# Patient Record
Sex: Male | Born: 1945
Health system: Southern US, Community
[De-identification: ages and names within clinical notes are randomized; demographics above are authoritative.]

## PROBLEM LIST (undated history)

## (undated) DIAGNOSIS — I10 Essential (primary) hypertension: Secondary | ICD-10-CM

## (undated) DIAGNOSIS — E785 Hyperlipidemia, unspecified: Secondary | ICD-10-CM

## (undated) DIAGNOSIS — M109 Gout, unspecified: Secondary | ICD-10-CM

## (undated) DIAGNOSIS — N186 End stage renal disease: Secondary | ICD-10-CM

## (undated) DIAGNOSIS — I059 Rheumatic mitral valve disease, unspecified: Secondary | ICD-10-CM

## (undated) DIAGNOSIS — I639 Cerebral infarction, unspecified: Secondary | ICD-10-CM

## (undated) DIAGNOSIS — B1920 Unspecified viral hepatitis C without hepatic coma: Secondary | ICD-10-CM

## (undated) DIAGNOSIS — F329 Major depressive disorder, single episode, unspecified: Secondary | ICD-10-CM

## (undated) DIAGNOSIS — IMO0002 Reserved for concepts with insufficient information to code with codable children: Secondary | ICD-10-CM

## (undated) DIAGNOSIS — F419 Anxiety disorder, unspecified: Secondary | ICD-10-CM

## (undated) DIAGNOSIS — E669 Obesity, unspecified: Secondary | ICD-10-CM

## (undated) DIAGNOSIS — R9430 Abnormal result of cardiovascular function study, unspecified: Secondary | ICD-10-CM

## (undated) DIAGNOSIS — J449 Chronic obstructive pulmonary disease, unspecified: Secondary | ICD-10-CM

## (undated) DIAGNOSIS — F32A Depression, unspecified: Secondary | ICD-10-CM

## (undated) DIAGNOSIS — R9439 Abnormal result of other cardiovascular function study: Secondary | ICD-10-CM

## (undated) DIAGNOSIS — H3500 Unspecified background retinopathy: Secondary | ICD-10-CM

## (undated) DIAGNOSIS — N529 Male erectile dysfunction, unspecified: Secondary | ICD-10-CM

## (undated) DIAGNOSIS — I82409 Acute embolism and thrombosis of unspecified deep veins of unspecified lower extremity: Secondary | ICD-10-CM

## (undated) DIAGNOSIS — R0602 Shortness of breath: Secondary | ICD-10-CM

## (undated) DIAGNOSIS — I5022 Chronic systolic (congestive) heart failure: Secondary | ICD-10-CM

## (undated) DIAGNOSIS — M1990 Unspecified osteoarthritis, unspecified site: Secondary | ICD-10-CM

## (undated) DIAGNOSIS — R7881 Bacteremia: Secondary | ICD-10-CM

## (undated) DIAGNOSIS — D649 Anemia, unspecified: Secondary | ICD-10-CM

## (undated) DIAGNOSIS — Z87898 Personal history of other specified conditions: Secondary | ICD-10-CM

## (undated) DIAGNOSIS — E119 Type 2 diabetes mellitus without complications: Secondary | ICD-10-CM

## (undated) DIAGNOSIS — I2510 Atherosclerotic heart disease of native coronary artery without angina pectoris: Secondary | ICD-10-CM

## (undated) HISTORY — DX: Obesity, unspecified: E66.9

## (undated) HISTORY — DX: Chronic systolic (congestive) heart failure: I50.22

## (undated) HISTORY — DX: Bacteremia: R78.81

## (undated) HISTORY — PX: OTHER SURGICAL HISTORY: SHX169

## (undated) HISTORY — DX: Rheumatic mitral valve disease, unspecified: I05.9

## (undated) HISTORY — DX: Unspecified background retinopathy: H35.00

## (undated) HISTORY — DX: Abnormal result of cardiovascular function study, unspecified: R94.30

## (undated) HISTORY — DX: Type 2 diabetes mellitus without complications: E11.9

## (undated) HISTORY — DX: Hyperlipidemia, unspecified: E78.5

## (undated) HISTORY — DX: End stage renal disease: N18.6

## (undated) HISTORY — DX: Unspecified viral hepatitis C without hepatic coma: B19.20

## (undated) HISTORY — PX: PENILE PROSTHESIS IMPLANT: SHX240

## (undated) HISTORY — PX: CATARACT EXTRACTION W/ INTRAOCULAR LENS  IMPLANT, BILATERAL: SHX1307

## (undated) HISTORY — DX: Male erectile dysfunction, unspecified: N52.9

## (undated) HISTORY — PX: COLONOSCOPY: SHX174

## (undated) HISTORY — PX: EYE SURGERY: SHX253

## (undated) HISTORY — DX: Reserved for concepts with insufficient information to code with codable children: IMO0002

## (undated) HISTORY — DX: Abnormal result of other cardiovascular function study: R94.39

---
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
## 1984-06-08 DIAGNOSIS — E1165 Type 2 diabetes mellitus with hyperglycemia: Secondary | ICD-10-CM | POA: Diagnosis present

## 2007-10-20 ENCOUNTER — Encounter: Payer: Self-pay | Admitting: Cardiovascular Disease

## 2008-02-23 ENCOUNTER — Encounter: Payer: Self-pay | Admitting: Cardiovascular Disease

## 2008-09-29 ENCOUNTER — Encounter: Payer: Self-pay | Admitting: Cardiovascular Disease

## 2008-09-30 ENCOUNTER — Encounter: Payer: Self-pay | Admitting: Cardiovascular Disease

## 2009-02-10 ENCOUNTER — Encounter: Payer: Self-pay | Admitting: Cardiovascular Disease

## 2009-02-15 ENCOUNTER — Encounter: Payer: Self-pay | Admitting: Cardiovascular Disease

## 2009-02-17 ENCOUNTER — Encounter: Payer: Self-pay | Admitting: Cardiovascular Disease

## 2009-02-28 ENCOUNTER — Ambulatory Visit: Payer: Self-pay | Admitting: Cardiovascular Disease

## 2009-02-28 DIAGNOSIS — E119 Type 2 diabetes mellitus without complications: Secondary | ICD-10-CM

## 2009-02-28 DIAGNOSIS — H35029 Exudative retinopathy, unspecified eye: Secondary | ICD-10-CM

## 2009-02-28 DIAGNOSIS — N186 End stage renal disease: Secondary | ICD-10-CM

## 2009-02-28 DIAGNOSIS — M109 Gout, unspecified: Secondary | ICD-10-CM

## 2009-02-28 DIAGNOSIS — Z992 Dependence on renal dialysis: Secondary | ICD-10-CM

## 2009-03-21 ENCOUNTER — Telehealth (INDEPENDENT_AMBULATORY_CARE_PROVIDER_SITE_OTHER): Payer: Self-pay | Admitting: Radiology

## 2009-03-28 ENCOUNTER — Ambulatory Visit (HOSPITAL_COMMUNITY): Admission: RE | Admit: 2009-03-28 | Discharge: 2009-03-28 | Payer: Self-pay | Admitting: Ophthalmology

## 2009-03-30 ENCOUNTER — Telehealth (INDEPENDENT_AMBULATORY_CARE_PROVIDER_SITE_OTHER): Payer: Self-pay | Admitting: *Deleted

## 2009-03-31 ENCOUNTER — Encounter: Payer: Self-pay | Admitting: Cardiovascular Disease

## 2009-03-31 ENCOUNTER — Ambulatory Visit: Payer: Self-pay

## 2009-04-12 ENCOUNTER — Ambulatory Visit (HOSPITAL_COMMUNITY): Admission: RE | Admit: 2009-04-12 | Discharge: 2009-04-12 | Payer: Self-pay | Admitting: Nephrology

## 2009-07-20 ENCOUNTER — Encounter (INDEPENDENT_AMBULATORY_CARE_PROVIDER_SITE_OTHER): Payer: Self-pay | Admitting: *Deleted

## 2009-09-10 ENCOUNTER — Inpatient Hospital Stay (HOSPITAL_COMMUNITY): Admission: AD | Admit: 2009-09-10 | Discharge: 2009-09-10 | Payer: Self-pay | Admitting: Orthopedic Surgery

## 2009-09-10 ENCOUNTER — Ambulatory Visit: Payer: Self-pay | Admitting: Surgery

## 2009-09-26 ENCOUNTER — Ambulatory Visit (HOSPITAL_COMMUNITY): Admission: RE | Admit: 2009-09-26 | Discharge: 2009-09-26 | Payer: Self-pay | Admitting: Ophthalmology

## 2009-09-30 ENCOUNTER — Ambulatory Visit: Payer: Self-pay | Admitting: Cardiovascular Disease

## 2009-10-14 ENCOUNTER — Ambulatory Visit (HOSPITAL_COMMUNITY): Admission: RE | Admit: 2009-10-14 | Discharge: 2009-10-14 | Payer: Self-pay | Admitting: Vascular Surgery

## 2009-10-14 ENCOUNTER — Ambulatory Visit: Payer: Self-pay | Admitting: Vascular Surgery

## 2009-11-03 ENCOUNTER — Ambulatory Visit: Payer: Self-pay | Admitting: Vascular Surgery

## 2009-11-03 DIAGNOSIS — Z794 Long term (current) use of insulin: Secondary | ICD-10-CM | POA: Insufficient documentation

## 2009-11-29 ENCOUNTER — Ambulatory Visit (HOSPITAL_COMMUNITY): Admission: RE | Admit: 2009-11-29 | Discharge: 2009-11-29 | Payer: Self-pay | Admitting: Vascular Surgery

## 2009-11-29 ENCOUNTER — Ambulatory Visit: Payer: Self-pay | Admitting: Vascular Surgery

## 2010-01-30 ENCOUNTER — Encounter (INDEPENDENT_AMBULATORY_CARE_PROVIDER_SITE_OTHER): Payer: Self-pay | Admitting: *Deleted

## 2010-03-16 ENCOUNTER — Ambulatory Visit (HOSPITAL_COMMUNITY): Admission: RE | Admit: 2010-03-16 | Discharge: 2010-03-16 | Payer: Self-pay | Admitting: Vascular Surgery

## 2010-03-16 ENCOUNTER — Ambulatory Visit: Payer: Self-pay | Admitting: Vascular Surgery

## 2010-03-28 ENCOUNTER — Ambulatory Visit: Payer: Self-pay | Admitting: Cardiovascular Disease

## 2010-04-24 ENCOUNTER — Ambulatory Visit (HOSPITAL_COMMUNITY): Admission: RE | Admit: 2010-04-24 | Discharge: 2010-04-24 | Payer: Self-pay | Admitting: Ophthalmology

## 2010-05-23 ENCOUNTER — Ambulatory Visit (HOSPITAL_COMMUNITY): Admission: RE | Admit: 2010-05-23 | Discharge: 2010-05-23 | Payer: Self-pay | Admitting: Nephrology

## 2010-06-10 ENCOUNTER — Ambulatory Visit (HOSPITAL_COMMUNITY): Admission: RE | Admit: 2010-06-10 | Discharge: 2010-06-10 | Payer: Self-pay | Admitting: *Deleted

## 2010-09-12 ENCOUNTER — Encounter (INDEPENDENT_AMBULATORY_CARE_PROVIDER_SITE_OTHER): Payer: Self-pay | Admitting: *Deleted

## 2010-09-12 ENCOUNTER — Ambulatory Visit (HOSPITAL_COMMUNITY): Admission: RE | Admit: 2010-09-12 | Discharge: 2010-09-12 | Payer: Self-pay | Admitting: Nephrology

## 2010-09-13 ENCOUNTER — Ambulatory Visit: Payer: Self-pay | Admitting: Vascular Surgery

## 2010-09-13 ENCOUNTER — Ambulatory Visit (HOSPITAL_COMMUNITY): Admission: RE | Admit: 2010-09-13 | Discharge: 2010-09-13 | Payer: Self-pay | Admitting: Vascular Surgery

## 2010-09-21 ENCOUNTER — Ambulatory Visit (HOSPITAL_COMMUNITY)
Admission: RE | Admit: 2010-09-21 | Discharge: 2010-09-21 | Payer: Self-pay | Source: Home / Self Care | Admitting: Vascular Surgery

## 2010-10-04 ENCOUNTER — Ambulatory Visit: Payer: Self-pay | Admitting: Vascular Surgery

## 2010-10-12 ENCOUNTER — Encounter
Admission: RE | Admit: 2010-10-12 | Discharge: 2010-10-12 | Payer: Self-pay | Source: Home / Self Care | Attending: Nephrology | Admitting: Nephrology

## 2010-10-24 ENCOUNTER — Ambulatory Visit
Admission: RE | Admit: 2010-10-24 | Discharge: 2010-10-24 | Payer: Self-pay | Source: Home / Self Care | Attending: Gastroenterology | Admitting: Gastroenterology

## 2010-11-14 NOTE — Letter (Signed)
Summary: Appointment - [facility]inder [place], [place]  [address] [place]   [place], [place] [address]   Phone: [phone]  Fax: [phone]     January 30, 2010 MRN: [id number]   JECORY VOCE [address] [place], [place]  [address]   Dear Mr. PASIERB,  [facility] records indicate that it is time to schedule a follow-up appointment with [name]. It is very important that we reach you to schedule this appointment. We look forward to participating in your health care needs. Please contact us at the number listed above at your earliest convenience to schedule your appointment.  If you are unable to make an appointment at this time, give [place] a call so we can update our records.     Sincerely,   Darnell Level [facility] Scheduling Team

## 2010-11-14 NOTE — Letter (Signed)
Summary: New Patient letter  [facility]  [address] [place], [place] [address]   Phone: [phone]  Fax: [phone]       09/12/2010 MRN: [id number]    Ernest [name] [address] [place], [place]  [address]  Dear Mr. PILE,  Welcome to the Gastroenterology Division at [facility].    You are scheduled to see Dr. Owens Loffler on Tuesday, October 24, 2010 at 9:15 a.m. on the 3rd floor at [facility].  We ask that you try to arrive at our office 15 minutes prior to your appointment time to allow for check-in.  We would like you to complete the enclosed self-administered evaluation form prior to your visit and bring it with you on the day of your appointment.  We will review it with you.  Also, please bring a complete list of all your medications or, if you prefer, bring the medication bottles and we will list them.  Please bring your insurance card so that we may make a copy of it.  If your insurance requires a referral to see a specialist, please bring your referral form from your primary care physician.  Co-payments are due at the time of your visit and may be paid by cash, check or credit card.     Your office visit will consist of a consult with your physician (includes a physical exam), any laboratory testing he/she may order, scheduling of any necessary diagnostic testing (e.g. x-ray, ultrasound, CT-scan), and scheduling of a procedure (e.g. Endoscopy, Colonoscopy) if required.  Please allow enough time on your schedule to allow for any/all of these possibilities.    If you cannot keep your appointment, please call [phone] to cancel or reschedule prior to your appointment date.  This allows [place] the opportunity to schedule an appointment for another patient in need of care.  If you do not cancel or reschedule by 5 p.m. the business day prior to your appointment date, you will be charged a $50.00 late cancellation/no-show fee.     Thank you for choosing [facility] for your medical needs.  We appreciate the opportunity to care for you.  Please visit [place] at our website  to learn more about our practice.                     Sincerely,                                                             The Gastroenterology Division

## 2010-11-14 NOTE — Miscellaneous (Signed)
  Clinical Lists Changes  Observations: Added new observation of EKG INTERP: NSR 85 LAD LVH Nonspecific ST/T wave changes (03/28/2010 15:27)      EKG Report  Procedure date:  03/28/2010  Findings:      NSR 85 LAD LVH Nonspecific ST/T wave changes

## 2010-11-14 NOTE — Assessment & Plan Note (Signed)
Summary: Ernest [name]   Visit Type:  Follow-up   History of Present Illness: Diabetic with multiple CRF's who moved from [place] with presumed CAD but never been cathed.  On dialysis for the past year.  Myovue 6/10 in our office normal and non-ischemic.  Been maintained on ASA and Plavix.  Sometimes takes Plavix every other day to save money. He gets dialysis in [facility] and Dr Clover Mealy is his primary nephrologist.  There have been no hemodynamic problems on dialysis or chest pain.  His BS has been suboptimally controlled due to dietary indiscretion and his insuling was just increased.    Current Problems (verified): 1)  Abnormal Electrocardiogram  (ICD-794.31) 2)  Mixed Hyperlipidemia  (ICD-272.2) 3)  Exudative Retinopathy  (ICD-362.12) 4)  Gout, Unspecified  (ICD-274.9) 5)  Esrd  (ICD-585.6) 6)  Aodm  (ICD-250.00)  Current Medications (verified): 1)  Aspirin 325 Mg  Tabs (Aspirin) .[name].. 1 Tab By Mouth Once Daily 2)  Plavix 75 Mg Tabs (Clopidogrel Bisulfate) .... Take One Tablet By Mouth Daily 3)  Calcium Acetate 667 Mg Caps (Calcium Acetate (Phos Binder)) .[name].. 1 Tab By Mouth Three Times A Day 4)  Colchicine 0.6 Mg Tabs (Colchicine) .[name].. 1 Tab By Mouth Once Daily 5)  Simvastatin 20 Mg Tabs (Simvastatin) .... Take One Tablet By Mouth Daily At Bedtime 6)  Allopurinol 100 Mg Tabs (Allopurinol) .[name].. 1 Tab Mon, Wen, Fri 7)  Rena-Vite  Tabs (B Complex-C-Folic Acid) .[name].. 1 Tab By Mouth Once Daily 8)  Metoprolol Succinate 50 Mg Xr24h-Tab (Metoprolol Succinate) .... Take 1/2 Tablet By Mouth Daily 9)  Humulin R 100 Unit/ml Soln (Insulin Regular Human) .... Uad 10)  Humulin N 100 Unit/ml Susp (Insulin Isophane Human) .... Uad 11)  Neurontin 300 Mg Caps (Gabapentin) .... Two Times A Day  Allergies (verified): No Known Drug Allergies  Past History:  Past Medical History: Last updated: 09/28/2009 Current Problems:  ABNORMAL ELECTROCARDIOGRAM (ICD-794.31) MIXED HYPERLIPIDEMIA  (ICD-272.2) EXUDATIVE RETINOPATHY (ICD-362.12) GOUT, UNSPECIFIED (ICD-274.9) ESRD (ICD-585.6) AODM (ICD-250.00) DM: since mid 9's with CRF and retinopathy CRF:  Dialysis since 2010 [name] Erectile dysfunction with penile implant Hepatitis C  Past Surgical History: Last updated: 03-15-09 Fistula RUE and wrist Penile implant  Family History: Last updated: 2009/03/15 Mother died 30 unknown cause Father with MI at age 71  Social History: Last updated: 03/15/2009 Retired Health visitor Disability Married Adopted mentally retarded child at home, 2 adopted children 3 living and 2 deceased Quti smoking 35 years ago Non-drinker  Review of Systems       Denies fever, malais, weight loss, blurry vision, decreased visual acuity, cough, sputum, SOB, hemoptysis, pleuritic pain, palpitaitons, heartburn, abdominal pain, melena, lower extremity edema, claudication, or rash.   Vital Signs:  Patient profile:   65 year old male Height:      69 inches Weight:      248 pounds BMI:     36.76 BP sitting:   126 / 50  (left arm)  Vitals Entered By: Margaretmary Bayley CMA (March 28, 2010 3:11 PM)  Physical Exam  General:  Affect appropriate Healthy:  appears stated age 8: normal Neck supple with no adenopathy JVP normal no bruits no thyromegaly Lungs clear with no wheezing and good diaphragmatic motion Heart:  S1/S2 no murmur,rub, gallop or click PMI normal Abdomen: benighn, BS positve, no tenderness, no AAA no bruit.  No HSM or HJR Distal pulses intact with no bruits No edema Neuro non-focal Skin warm and dry Fistula in RUE that  has good thrill Bilateral wrist fistula not active   Impression & Recommendations:  Problem # 1:  MIXED HYPERLIPIDEMIA (ICD-272.2) Continue statin given risk factors His updated medication list for this problem includes:    Simvastatin 20 Mg Tabs (Simvastatin) .[name]... Take one tablet by mouth daily at bedtime  Problem # 2:  ABNORMAL  ELECTROCARDIOGRAM (ICD-794.31) Likely related to body habitus and LVH.   His updated medication list for this problem includes:    Aspirin 325 Mg Tabs (Aspirin) .[name]... 1 tab by mouth once daily    Plavix 75 Mg Tabs (Clopidogrel bisulfate) .[name]... Take one tablet by mouth daily    Metoprolol Succinate 50 Mg Xr24h-tab (Metoprolol succinate) .[name]... Take 1/2 tablet by mouth daily  Orders: EKG w/ Interpretation (93000)  Problem # 3:  CAD (ICD-414.00) Presumed diagnosis from [place].  Nonischemic myovue here 6/10.  Normal LV function.  Continue ASA and Plavix His updated medication list for this problem includes:    Aspirin 325 Mg Tabs (Aspirin) .[name]... 1 tab by mouth once daily    Plavix 75 Mg Tabs (Clopidogrel bisulfate) .[name]... Take one tablet by mouth daily    Metoprolol Succinate 50 Mg Xr24h-tab (Metoprolol succinate) .[name]... Take 1/2 tablet by mouth daily  Patient Instructions: 1)  Your physician recommends that you schedule a follow-up appointment in: [place] 2)  Your physician recommends that you continue on your current medications as directed. Please refer to the Current Medication list given to you today.

## 2010-11-16 NOTE — Assessment & Plan Note (Signed)
History of Present Illness Visit Type: Initial Visit Primary GI MD: Owens Loffler MD Primary Provider: Marshall Cork, MD Chief Complaint: screening for transplant History of Present Illness:     very pleasant 65 year old man who is here with his family member today.  he had a colonoscopy in [place] about 2-1/2 at [facility] in [place] was told he needed an "up to date" colonoscopy for renal transplant workup.  He has been to [facility] kidney transplant.  he is on plavix, has been on it since 2006; was dizzy.  No GI symptoms.             Current Medications (verified): 1)  Aspirin 325 Mg  Tabs (Aspirin) .[name].. 1 Tab By Mouth Once Daily 2)  Plavix 75 Mg Tabs (Clopidogrel Bisulfate) .... Take One Tablet By Mouth Daily 3)  Calcium Acetate 667 Mg Caps (Calcium Acetate (Phos Binder)) .[name].. 1 Tab By Mouth Three Times A Day 4)  Colchicine 0.6 Mg Tabs (Colchicine) .[name].. 1 Tab By Mouth Once Daily 5)  Simvastatin 20 Mg Tabs (Simvastatin) .... Take One Tablet By Mouth Daily At Bedtime 6)  Allopurinol 100 Mg Tabs (Allopurinol) .[name].. 1 Tab Mon, Wen, Fri 7)  Rena-Vite  Tabs (B Complex-C-Folic Acid) .[name].. 1 Tab By Mouth Once Daily 8)  Metoprolol Succinate 50 Mg Xr24h-Tab (Metoprolol Succinate) .... Take 1/2 Tablet By Mouth Daily 9)  Humulin R 100 Unit/ml Soln (Insulin Regular Human) .... Uad 10)  Humulin N 100 Unit/ml Susp (Insulin Isophane Human) .... Uad 11)  Neurontin 300 Mg Caps (Gabapentin) .... Two Times A Day  Allergies (verified): No Known Drug Allergies  Past History:  Past Medical History: Current Problems:  ABNORMAL ELECTROCARDIOGRAM (ICD-794.31) MIXED HYPERLIPIDEMIA (ICD-272.2) EXUDATIVE RETINOPATHY (ICD-362.12) GOUT, UNSPECIFIED (ICD-274.9) ESRD (ICD-585.6), on hemodialysis Monday, Wednesday, Friday AODM (ICD-250.00) DM: since mid 80's with CRF and retinopathy CRF:  Dialysis since 2010 [name] Erectile dysfunction with penile implant Hepatitis C  Past  Surgical History: Fistula RUE and wrist Penile implant    Family History: Mother died 23 unknown cause Father with MI at age 68   No colon cancer  Social History: Retired Health visitor Disability Married Adopted mentally retarded child at home, 2 adopted children 3 living and 2 deceased Quti smoking 35 years ago Non-drinker    Review of Systems       Pertinent positive and negative review of systems were noted in the above HPI and GI specific review of systems.  All other review of systems was otherwise negative.   Vital Signs:  Patient profile:   65 year old male Height:      69 inches Weight:      250.50 pounds BMI:     37.13 Pulse rate:   72 / minute Pulse rhythm:   regular BP sitting:   158 / 70  (left arm) Cuff size:   large  Vitals Entered By: June McMurray CMA Deborra Medina) (October 24, 2010 8:55 AM)  Physical Exam  Additional Exam:  Constitutional: generally well appearing Psychiatric: alert and oriented times 3 Eyes: extraocular movements intact Mouth: oropharynx moist, no lesions Neck: supple, no lymphadenopathy Cardiovascular: heart regular rate and rythm Lungs: CTA bilaterally Abdomen: soft, non-tender, non-distended, no obvious ascites, no peritoneal signs, normal bowel sounds Extremities: no lower extremity edema bilaterally Skin: no lesions on visible extremities    Impression & Recommendations:  Problem # 1:  Routine risk for colon cancer he is pretty clear that he had a colonoscopy  within the last 2-3 years. We will get those records from [place] sent down for review including any pathology. Depending on those results, he may or may not need another colonoscopy around now. I will make the decision after reviewing his records.  Patient Instructions: 1)  We will get records from colonoscopy 2-3 years ago that was done in [place], also any pathology reports. 2)  A copy of this information will be sent to  Dr. Jonnie Finner, also to Mathis Dad, RN (phone number [phone]). 3)  The medication list was reviewed and reconciled.  All changed / newly prescribed medications were explained.  A complete medication list was provided to the patient / caregiver.

## 2010-12-20 ENCOUNTER — Other Ambulatory Visit (HOSPITAL_COMMUNITY): Payer: Medicare Other

## 2010-12-21 ENCOUNTER — Ambulatory Visit (HOSPITAL_COMMUNITY)
Admission: RE | Admit: 2010-12-21 | Discharge: 2010-12-21 | Disposition: A | Discharge status: Home / Self Care | Payer: Medicare Other | Source: Ambulatory Visit | Attending: Vascular Surgery | Admitting: Vascular Surgery

## 2010-12-21 ENCOUNTER — Ambulatory Visit (HOSPITAL_COMMUNITY): Payer: Medicare Other

## 2010-12-21 DIAGNOSIS — M109 Gout, unspecified: Secondary | ICD-10-CM | POA: Insufficient documentation

## 2010-12-21 DIAGNOSIS — E119 Type 2 diabetes mellitus without complications: Secondary | ICD-10-CM | POA: Insufficient documentation

## 2010-12-21 DIAGNOSIS — N186 End stage renal disease: Secondary | ICD-10-CM | POA: Insufficient documentation

## 2010-12-21 DIAGNOSIS — I120 Hypertensive chronic kidney disease with stage 5 chronic kidney disease or end stage renal disease: Secondary | ICD-10-CM | POA: Insufficient documentation

## 2010-12-21 DIAGNOSIS — T82898A Other specified complication of vascular prosthetic devices, implants and grafts, initial encounter: Secondary | ICD-10-CM | POA: Insufficient documentation

## 2010-12-21 DIAGNOSIS — Z951 Presence of aortocoronary bypass graft: Secondary | ICD-10-CM | POA: Insufficient documentation

## 2010-12-21 DIAGNOSIS — Y832 Surgical operation with anastomosis, bypass or graft as the cause of abnormal reaction of the patient, or of later complication, without mention of misadventure at the time of the procedure: Secondary | ICD-10-CM | POA: Insufficient documentation

## 2010-12-21 LAB — POCT I-STAT 4, (NA,K, GLUC, HGB,HCT)
Glucose, Bld: 92 mg/dL (ref 70–99)
HCT: 34 % — ABNORMAL LOW (ref 39.0–52.0)
Hemoglobin: 11.6 g/dL — ABNORMAL LOW (ref 13.0–17.0)
Potassium: 4.5 mEq/L (ref 3.5–5.1)
Sodium: 141 mEq/L (ref 135–145)

## 2010-12-22 NOTE — Op Note (Signed)
NAME:  Ernest [name], Ernest [name] NO.:  [id number]  MEDICAL RECORD NO.:  [id number]           PATIENT TYPE:  O  LOCATION:  [facility]SC                         FACILITY:  [facility]  PHYSICIAN:  [name]. Scot Dock, M.D.DATE OF BIRTH:  1946/07/13  DATE OF PROCEDURE:  12/21/2010 DATE OF DISCHARGE:  12/21/2010                              OPERATIVE REPORT   PREOPERATIVE DIAGNOSIS:  Clotted right upper arm atrioventricular fistula.  POSTOPERATIVE DIAGNOSIS:  Clotted right upper arm atrioventricular fistula.  PROCEDURE: 1. Thrombectomy of right upper arm AV fistula. 2. Revision of the fistula with interposition 6-mm PTFE at the     arterial end of the graft. 3. Intraoperative fistulogram.  SURGEON:  [name]. Scot Dock, MD  ASSISTANT:  Wray Kearns, [name]  ANESTHESIA:  Local with sedation.  INDICATIONS:  This is a 65 year old gentleman who had an upper arm fistula for some time.  He presented with evidence of an outflow stenosis and underwent fistulogram by Dr. Bridgett Larsson.  He ballooned to a cephalic vein stenosis with some disruption of the vein; however, he was able to salvage this and the patient has been using this fistula for dialysis.  He apparently went to the dialysis unit yesterday and got through most of the dialysis and then graft clotted.  The PA from the dialysis center reportedly asked that we perform a surgical thrombectomy.  TECHNIQUE:  The patient was taken to the operating room and sedated by Anesthesia.  The right upper extremity was prepped and draped in usual sterile fashion.  After the skin was anesthetized with 1% lidocaine, the incision over the proximal aspect of the fistula was dissected free and the vein was dissected free down it where it was anastomosed to the brachial artery.  Next, the patient was heparinized.  A transverse venotomy was made at the arterial end after the proximal vein was clamped.  There was thick diffuse intimal hyperplasia  present here.  The fistula further distally was clotted.  Using a #5 Fogarty catheter, I performed thrombectomy of the fistula with good backbleeding.  In pulling the catheter through the fistula, there were obviously multiple areas of intimal hyperplasia.  The tightest area was probably at the distal cephalic vein based on the length where it was most narrowed. Given the degree of intimal hyperplasia here, I felt the only option at the arterial end was to revise this with an interposition segment of 6- mm PTFE.  Tourniquet was placed on the upper arm.  The arm exsanguinated with Esmarch bandage, the tourniquet inflated to 270 mmHg.  Under tourniquet control, the proximal aspect of the fistula was spatulated down to below the level of intimal hyperplasia.  The 6-mm graft was slightly spatulated and sewn end-to-end to the vein using continuous 6-0 Prolene suture.  The more proximal vein was spatulated and the graft cut to the appropriate length, spatulated, and sewn end-to-end to the vein using continuous 6-0 Prolene suture.  At completion, the fistula was pulsatile.  I did shoot an intraoperative fistulogram which showed some diffuse intimal hyperplasia throughout the fistula but no focal stenosis.  I was unable to visualize distally  where I suspected to have a distal cephalic vein stenosis.  The heparin was partially reversed with protamine.  The wound was then closed with a deep layer of 3-0 Vicryl and the skin closed with 4-0 Vicryl.  Sterile dressing was applied.  The patient tolerated the procedure well and was transferred to the recovery room in stable condition.  All needle and sponge counts were correct.     [name]. Scot Dock, M.D.     CSD/MEDQ  D:  12/21/2010  T:  12/22/2010  Job:  [id number]  Electronically Signed by Deitra Mayo M.D. on 12/22/2010 06:54:06 AM

## 2010-12-26 LAB — GLUCOSE, CAPILLARY: Glucose-Capillary: 215 mg/dL — ABNORMAL HIGH (ref 70–99)

## 2010-12-26 LAB — POCT I-STAT, CHEM 8
BUN: 32 mg/dL — ABNORMAL HIGH (ref 6–23)
Calcium, Ion: 1.08 mmol/L — ABNORMAL LOW (ref 1.12–1.32)
Creatinine, Ser: 6.3 mg/dL — ABNORMAL HIGH (ref 0.4–1.5)
Glucose, Bld: 164 mg/dL — ABNORMAL HIGH (ref 70–99)
Hemoglobin: 11.9 g/dL — ABNORMAL LOW (ref 13.0–17.0)
Sodium: 140 mEq/L (ref 135–145)
TCO2: 27 mmol/L (ref 0–100)

## 2010-12-26 LAB — POCT I-STAT 4, (NA,K, GLUC, HGB,HCT)
Glucose, Bld: 194 mg/dL — ABNORMAL HIGH (ref 70–99)
HCT: 37 % — ABNORMAL LOW (ref 39.0–52.0)
Hemoglobin: 12.6 g/dL — ABNORMAL LOW (ref 13.0–17.0)

## 2010-12-31 LAB — CBC
HCT: 31.7 % — ABNORMAL LOW (ref 39.0–52.0)
Hemoglobin: 10.8 g/dL — ABNORMAL LOW (ref 13.0–17.0)
MCH: 30.6 pg (ref 26.0–34.0)
MCV: 89.9 fL (ref 78.0–100.0)
RBC: 3.53 MIL/uL — ABNORMAL LOW (ref 4.22–5.81)

## 2010-12-31 LAB — GLUCOSE, CAPILLARY
Glucose-Capillary: 131 mg/dL — ABNORMAL HIGH (ref 70–99)
Glucose-Capillary: 145 mg/dL — ABNORMAL HIGH (ref 70–99)

## 2010-12-31 LAB — BASIC METABOLIC PANEL
CO2: 24 mEq/L (ref 19–32)
Chloride: 107 mEq/L (ref 96–112)
GFR calc Af Amer: 7 mL/min — ABNORMAL LOW (ref >60)
Potassium: 5.2 mEq/L — ABNORMAL HIGH (ref 3.5–5.1)
Sodium: 140 mEq/L (ref 135–145)

## 2010-12-31 LAB — SURGICAL PCR SCREEN: MRSA, PCR: NEGATIVE

## 2011-01-01 ENCOUNTER — Ambulatory Visit (HOSPITAL_COMMUNITY)
Admission: RE | Admit: 2011-01-01 | Discharge: 2011-01-01 | Disposition: A | Discharge status: Home / Self Care | Payer: Medicare Other | Source: Ambulatory Visit | Attending: Vascular Surgery | Admitting: Vascular Surgery

## 2011-01-01 DIAGNOSIS — Z01810 Encounter for preprocedural cardiovascular examination: Secondary | ICD-10-CM | POA: Insufficient documentation

## 2011-01-01 DIAGNOSIS — I871 Compression of vein: Secondary | ICD-10-CM | POA: Insufficient documentation

## 2011-01-01 DIAGNOSIS — N189 Chronic kidney disease, unspecified: Secondary | ICD-10-CM | POA: Insufficient documentation

## 2011-01-01 DIAGNOSIS — N186 End stage renal disease: Secondary | ICD-10-CM

## 2011-01-01 DIAGNOSIS — T82898A Other specified complication of vascular prosthetic devices, implants and grafts, initial encounter: Secondary | ICD-10-CM

## 2011-01-01 DIAGNOSIS — I120 Hypertensive chronic kidney disease with stage 5 chronic kidney disease or end stage renal disease: Secondary | ICD-10-CM

## 2011-01-01 LAB — POCT I-STAT, CHEM 8
Calcium, Ion: 1.32 mmol/L (ref 1.12–1.32)
Chloride: 110 mEq/L (ref 96–112)
HCT: 30 % — ABNORMAL LOW (ref 39.0–52.0)
Hemoglobin: 10.2 g/dL — ABNORMAL LOW (ref 13.0–17.0)
Potassium: 5.5 mEq/L — ABNORMAL HIGH (ref 3.5–5.1)

## 2011-01-03 LAB — GLUCOSE, CAPILLARY: Glucose-Capillary: 193 mg/dL — ABNORMAL HIGH (ref 70–99)

## 2011-01-03 LAB — POCT I-STAT 4, (NA,K, GLUC, HGB,HCT)
Glucose, Bld: 414 mg/dL — ABNORMAL HIGH (ref 70–99)
HCT: 35 % — ABNORMAL LOW (ref 39.0–52.0)
Sodium: 136 mEq/L (ref 135–145)

## 2011-01-03 NOTE — Op Note (Signed)
NAME:  Ernest [name], GRASSL NO.:  [id number]  MEDICAL RECORD NO.:  [id number]           PATIENT TYPE:  LOCATION:                                 FACILITY:  PHYSICIAN:  [name]. Scot Dock, M.D.DATE OF BIRTH:  17-May-1946  DATE OF PROCEDURE: DATE OF DISCHARGE:                              OPERATIVE REPORT   PROCEDURES: 1. Ultrasound-guided access to right upper arm AV fistula. 2. Right upper arm AV fistula. 3. PTA of right cephalic vein stenosis with 4 mm x 4 cm balloon. 4. PTA with 5 mm x 4 cm balloon. 5. PTA with 6 mm x 4 cm balloon. 6. Placement of 6-mm x 27-mm stent.  FINDINGS:  An 80% right distal cephalic vein stenosis with successful PTA and stenting with minimal residual stenosis.  INDICATIONS:  This is a 65 year old gentleman who had undergone previous fistulogram for a cephalic vein stenosis.  This was ballooned and according to the records at that time, the patient had a perforationwhich was controlled with balloon tamponade.  The fistula worked for a while and then more recently the patient had a surgical thrombectomy for a clotted AV fistula.  He was then brought back to reassess the cephalic vein stenosis and consider re-intervening.  TECHNIQUE:  The patient was taken to the [facility] lab and was not sedated. The right upper extremity was prepped and draped in usual sterile fashion.  After the skin was anesthetized with 1% lidocaine and under ultrasound guidance, the proximal right upper arm AV fistula was cannulated with a micropuncture set and fistulogram obtained through the micropuncture catheter.  This demonstrated approximately 80% stenosis over approximately 2 cm of the distal cephalic vein before it emptied into the subclavian vein.  The remainder of the fistula appeared to be in good shape with no significant stenoses or aneurysms.  Next, the micropuncture catheter was exchanged over wire for a 6-French sheath.  A 4 mm x 4 cm balloon was  positioned across the cephalic vein stenosis and the balloon inflated to 10 atmospheres.  Follow up fistulogram showed residual stenosis.  I started with a very small balloon given the patient's problem with the previous rupture, at which time, they had used a 7-mm balloon.  I then went to a 5-mm right x 4-cm balloon and again and ballooned up to 10 atmospheres.  Again there was residual stenosis, so I went to a 6-mm x 4-cm balloon.  There was a slight residual stenosis in the mid segment of the vein.  Completion of arteriogram showed recoiling of the stenosis which was resilient.  Given there were really no further options to salvage the fistula, if this failed, I elected to place a stent.  The 6-mm x 27-mm Express stent was positioned across the stenosis and employed to 10 atmospheres.  There was one area with this slight residual stenosis, so I went back with the 6-mm Dorado balloon inflated this to 22 atmospheres, but was unable to completely remove this small waist given the risk for perforation in fact that this was likely related to dense scar tissue from the previous perforation, I stopped at this  point visually and with minimal residual stenosis, however.  At the completion, a purse- string suture was placed around the cannulation site and there was good hemostasis.  If this fistula clots in the future, he should be evaluated for new access and it appears that he could potentially have a basilic vein transposition on the left arm.     [name]. Scot Dock, M.D.     CSD/MEDQ  D:  01/01/2011  T:  01/02/2011  Job:  [id number]  cc:   [facility]  Electronically Signed by Deitra Mayo M.D. on 01/03/2011 11:16:38 AM

## 2011-01-15 LAB — GLUCOSE, CAPILLARY
Glucose-Capillary: 102 mg/dL — ABNORMAL HIGH (ref 70–99)
Glucose-Capillary: 71 mg/dL (ref 70–99)

## 2011-01-15 LAB — PROTIME-INR: INR: 1.02 (ref 0.00–1.49)

## 2011-01-15 LAB — POCT I-STAT 4, (NA,K, GLUC, HGB,HCT)
HCT: 38 % — ABNORMAL LOW (ref 39.0–52.0)
Sodium: 141 mEq/L (ref 135–145)

## 2011-01-15 LAB — APTT: aPTT: 29 seconds (ref 24–37)

## 2011-01-16 LAB — APTT: aPTT: 31 seconds (ref 24–37)

## 2011-01-16 LAB — PROTIME-INR
INR: 1.12 (ref 0.00–1.49)
Prothrombin Time: 14.3 seconds (ref 11.6–15.2)

## 2011-01-16 LAB — CBC
Hemoglobin: 12.9 g/dL — ABNORMAL LOW (ref 13.0–17.0)
MCHC: 33.5 g/dL (ref 30.0–36.0)
RBC: 4.31 MIL/uL (ref 4.22–5.81)
WBC: 9.1 10*3/uL (ref 4.0–10.5)

## 2011-01-16 LAB — BASIC METABOLIC PANEL
CO2: 24 mEq/L (ref 19–32)
Calcium: 9.2 mg/dL (ref 8.4–10.5)
Creatinine, Ser: 6.42 mg/dL — ABNORMAL HIGH (ref 0.4–1.5)
GFR calc Af Amer: 11 mL/min — ABNORMAL LOW (ref >60)
GFR calc non Af Amer: 9 mL/min — ABNORMAL LOW (ref >60)
Sodium: 142 mEq/L (ref 135–145)

## 2011-01-16 LAB — GLUCOSE, CAPILLARY
Glucose-Capillary: 138 mg/dL — ABNORMAL HIGH (ref 70–99)
Glucose-Capillary: 144 mg/dL — ABNORMAL HIGH (ref 70–99)

## 2011-01-17 LAB — GLUCOSE, CAPILLARY: Glucose-Capillary: 181 mg/dL — ABNORMAL HIGH (ref 70–99)

## 2011-01-22 LAB — BASIC METABOLIC PANEL
Chloride: 105 mEq/L (ref 96–112)
GFR calc Af Amer: 7 mL/min — ABNORMAL LOW (ref >60)
GFR calc non Af Amer: 5 mL/min — ABNORMAL LOW (ref >60)
Potassium: 6.4 mEq/L (ref 3.5–5.1)

## 2011-01-22 LAB — GLUCOSE, CAPILLARY: Glucose-Capillary: 194 mg/dL — ABNORMAL HIGH (ref 70–99)

## 2011-01-22 LAB — CBC
HCT: 39 % (ref 39.0–52.0)
MCV: 90.6 fL (ref 78.0–100.0)
RBC: 4.31 MIL/uL (ref 4.22–5.81)
WBC: 8.2 10*3/uL (ref 4.0–10.5)

## 2011-01-22 LAB — PROTIME-INR: Prothrombin Time: 13.2 seconds (ref 11.6–15.2)

## 2011-01-22 LAB — APTT: aPTT: 30 seconds (ref 24–37)

## 2011-02-19 ENCOUNTER — Other Ambulatory Visit (HOSPITAL_COMMUNITY): Payer: Self-pay | Admitting: Nephrology

## 2011-02-19 DIAGNOSIS — N186 End stage renal disease: Secondary | ICD-10-CM

## 2011-02-27 ENCOUNTER — Ambulatory Visit (HOSPITAL_COMMUNITY)
Admission: RE | Admit: 2011-02-27 | Discharge: 2011-02-27 | Disposition: A | Discharge status: Home / Self Care | Payer: Medicare Other | Source: Ambulatory Visit | Attending: Nephrology | Admitting: Nephrology

## 2011-02-27 ENCOUNTER — Other Ambulatory Visit (HOSPITAL_COMMUNITY): Payer: Self-pay | Admitting: Nephrology

## 2011-02-27 ENCOUNTER — Ambulatory Visit (HOSPITAL_COMMUNITY): Payer: Medicare Other

## 2011-02-27 DIAGNOSIS — I878 Other specified disorders of veins: Secondary | ICD-10-CM

## 2011-02-27 DIAGNOSIS — N186 End stage renal disease: Secondary | ICD-10-CM

## 2011-02-27 DIAGNOSIS — R031 Nonspecific low blood-pressure reading: Secondary | ICD-10-CM

## 2011-02-27 DIAGNOSIS — I871 Compression of vein: Secondary | ICD-10-CM | POA: Insufficient documentation

## 2011-02-27 DIAGNOSIS — T82898A Other specified complication of vascular prosthetic devices, implants and grafts, initial encounter: Secondary | ICD-10-CM | POA: Insufficient documentation

## 2011-02-27 DIAGNOSIS — M109 Gout, unspecified: Secondary | ICD-10-CM | POA: Insufficient documentation

## 2011-02-27 DIAGNOSIS — I120 Hypertensive chronic kidney disease with stage 5 chronic kidney disease or end stage renal disease: Secondary | ICD-10-CM | POA: Insufficient documentation

## 2011-02-27 DIAGNOSIS — E119 Type 2 diabetes mellitus without complications: Secondary | ICD-10-CM | POA: Insufficient documentation

## 2011-02-27 DIAGNOSIS — Y831 Surgical operation with implant of artificial internal device as the cause of abnormal reaction of the patient, or of later complication, without mention of misadventure at the time of the procedure: Secondary | ICD-10-CM | POA: Insufficient documentation

## 2011-02-27 DIAGNOSIS — I2510 Atherosclerotic heart disease of native coronary artery without angina pectoris: Secondary | ICD-10-CM | POA: Insufficient documentation

## 2011-02-27 DIAGNOSIS — E213 Hyperparathyroidism, unspecified: Secondary | ICD-10-CM | POA: Insufficient documentation

## 2011-02-27 MED ORDER — IOHEXOL 300 MG/ML  SOLN
100.0000 mL | Freq: Once | INTRAMUSCULAR | Status: AC | PRN
  Administered 2011-02-27: 35 mL via INTRAVENOUS

## 2011-02-27 NOTE — Assessment & Plan Note (Signed)
OFFICE VISIT   Ernest [name], Ernest [name]  DOB:  1946/06/18                                       10/04/2010  CHART#:[id number]   I saw this patient in the office today to evaluate him for access in his  left arm according to the patient.  The patient had placement of the  right brachial cephalic fistula in February of this year.  In November  of this year, he underwent a simple thrombectomy of his right upper arm  fistula and after that was apparently set up for a fistulogram which was  performed by Dr. Bridgett Larsson.  He was found to have a high-grade stenosis of  greater than 90% in the proximal cephalic vein adjacent to the axillary  vein.  This was addressed with balloon angioplasty and he had a  contained rupture of the right cephalic vein that resolved with  sustained  low  pressure venoplasty at the cephalic vein.  There was an  approximately 50% residual stenosis at the proximal right cephalic vein  at the completion.  This was done on 09/21/2010.  Currently he tells me  that his right upper arm fistula is working with no significant  problems.  He states that he was sent to the office today to be  evaluated for access in the left arm should the fistula on the right arm  fail.  Note:  He denies any recent uremic symptoms.  He had no nausea,  vomiting, anorexia, fatigue or significant shortness of breath or  palpitations.   REVIEW OF SYSTEMS:  CARDIAC:  He has had no chest pain, chest pressure,  palpitations or arrhythmias.   PHYSICAL EXAMINATION:  This is a pleasant 65 year old gentleman who  appears his stated age.  His blood pressure 138/67, heart rate is 94,  temperature 97.4.  Lungs:  Clear bilaterally to auscultation.  His upper  arm fistula on the right has a good thrill; it is slightly pulsatile.  He has a fistula in his left forearm which has a thrill at the wrist but  then is difficult to follow in the upper arm.  He had a previous upper  arm fistula on  the left.   I obtained a vein map today which shows that his upper arm cephalic vein  is thrombosed.  The forearm fistula empties into a branch in the mid  forearm.  The basilic vein in the left upper arm empties into the  brachial system in the mid upper arm and therefore there is not adequate  length for a basilic vein transposition.   Based on the fact that he would require an AV graft in the left arm, I  would not recommend placing a graft at this time given the risk for  infection and intimal hyperplasia at the venous anastomoses.  Currently  he states that the upper arm fistula on the right is working without any  problems.  I would favor continuing to use the fistula for now and only  consider placement of graft in the left arm if this fails.  If he has  problems with decreasing clearance in the right upper arm fistula  consideration could be given in the future to potentially revising the  area of narrowed cephalic vein noted on his previous fistulogram.  As he  just recently had a contained rupture, however, and  I think this is not  a good time to operate in this area.  In the future consideration to be  given to replacing this segment with an interposition saphenous vein  graft to potentially try to maintain the upper arm fistula on the right.   He knows to call if he develops a problem with his  upper arm fistula.     [name]. Scot Dock, M.D.  Electronically Signed   CSD/MEDQ  D:  10/04/2010  T:  10/05/2010  Job:  [id number]   cc:   [facility]

## 2011-02-27 NOTE — Assessment & Plan Note (Signed)
OFFICE VISIT   Ernest [name], Ernest [name]  DOB:  08/15/1946                                       11/03/2009  CHART#:[id number]   I saw the patient in the office today to evaluate him for further  access.  He has had a forearm fistula on the left which never worked and  then he had an upper arm fistula which was eventually abandoned.  On  10/14/2009 he had a right IJ Palindrome catheter placed.  He was sent  for evaluation for access.  His catheter has been functioning well and  he has no specific complaints today.   REVIEW OF SYSTEMS:  He has had no chest pain, chest pressure or  significant short of breath.   PHYSICAL EXAMINATION:  His blood pressure is 154/81, heart rate is 66,  temperature 97.8.  This is a pleasant 65 year old gentleman who appears  his stated age.  Lungs are clear bilaterally to auscultation.  He has a  nonfunctioning upper arm fistula on the left.   I reviewed his vein mapping today which shows that the basilic vein in  the left arm in the mid upper arm dives into the deep system therefore I  do not think there is adequate length for a basilic transposition on the  left.  He has a reasonable sized cephalic vein in the forearm and upper  arm on the right side.  I think he is potentially a candidate for an AV  fistula in the right arm.  I have explained that we would likely explore  his forearm cephalic vein and if this is adequate place a forearm  fistula and if not we would explore his upper arm cephalic vein for an  upper arm fistula.  Of note, his forearm fistula on the left did not  work and we will take this into consideration.  If neither vein is  adequate we would have to place an AV graft.  His surgery has been  scheduled for February 8.  This is a nondialysis day.     [name]. Scot Dock, M.D.  Electronically Signed   CSD/MEDQ  D:  11/03/2009  T:  11/04/2009  Job:  [id number]

## 2011-02-27 NOTE — Procedures (Signed)
CEPHALIC VEIN MAPPING   INDICATION:  Evaluation for new AVF.   HISTORY:  Renal failure.   EXAM:  The right cephalic vein is compressible.   Diameter measurements range from 0.28 to 0.36 cm.   The left basilic vein is compressible.   Diameter measurements range from 0.27 to 0.46 cm.   See attached worksheet for all measurements.   IMPRESSION:  The patient's right cephalic and left basilic veins are of  questionable diameter for use as dialysis access graft.      ___________________________________________  [name]. Scot Dock, M.D.   CJ/MEDQ  D:  11/03/2009  T:  11/03/2009  Job:  [id number]

## 2011-02-27 NOTE — Procedures (Signed)
VASCULAR LAB EXAM   INDICATION:  Non maturing left AVF.   HISTORY:  Diabetes:  Yes.  Cardiac:  No.  Hypertension:  Yes.   EXAM:  Left AVF duplex.   IMPRESSION:  1. Malfunctioning left brachial cephalic AVF most likely due to      cephalic vein thrombus that is noted from the proximal upper arm to      the mid forearm of the left upper extremity.  2. There is a short segment of the left basilic vein noted in the      upper extremity.  3. Please see following worksheet for all measurements.   ___________________________________________  [name]. Scot Dock, M.D.   EM/MEDQ  D:  10/05/2010  T:  10/05/2010  Job:  [id number]

## 2011-04-04 ENCOUNTER — Ambulatory Visit (HOSPITAL_COMMUNITY)
Admission: RE | Admit: 2011-04-04 | Discharge: 2011-04-04 | Disposition: A | Discharge status: Home / Self Care | Payer: Medicare Other | Source: Ambulatory Visit | Attending: Nephrology | Admitting: Nephrology

## 2011-04-04 ENCOUNTER — Other Ambulatory Visit (HOSPITAL_COMMUNITY): Payer: Self-pay | Admitting: Nephrology

## 2011-04-04 DIAGNOSIS — E119 Type 2 diabetes mellitus without complications: Secondary | ICD-10-CM | POA: Insufficient documentation

## 2011-04-04 DIAGNOSIS — N186 End stage renal disease: Secondary | ICD-10-CM | POA: Insufficient documentation

## 2011-04-04 DIAGNOSIS — M109 Gout, unspecified: Secondary | ICD-10-CM | POA: Insufficient documentation

## 2011-04-04 DIAGNOSIS — I120 Hypertensive chronic kidney disease with stage 5 chronic kidney disease or end stage renal disease: Secondary | ICD-10-CM | POA: Insufficient documentation

## 2011-04-04 DIAGNOSIS — Y832 Surgical operation with anastomosis, bypass or graft as the cause of abnormal reaction of the patient, or of later complication, without mention of misadventure at the time of the procedure: Secondary | ICD-10-CM | POA: Insufficient documentation

## 2011-04-04 DIAGNOSIS — T82898A Other specified complication of vascular prosthetic devices, implants and grafts, initial encounter: Secondary | ICD-10-CM | POA: Insufficient documentation

## 2011-04-04 LAB — POTASSIUM: Potassium: 4 mEq/L (ref 3.5–5.1)

## 2011-04-04 MED ORDER — IOHEXOL 300 MG/ML  SOLN
100.0000 mL | Freq: Once | INTRAMUSCULAR | Status: AC | PRN
  Administered 2011-04-04: 45 mL via INTRAVENOUS

## 2011-06-11 ENCOUNTER — Encounter: Payer: Self-pay | Admitting: Cardiovascular Disease

## 2011-06-12 ENCOUNTER — Encounter: Payer: Self-pay | Admitting: Cardiovascular Disease

## 2011-06-12 ENCOUNTER — Ambulatory Visit (INDEPENDENT_AMBULATORY_CARE_PROVIDER_SITE_OTHER): Payer: Medicare Other | Admitting: Cardiovascular Disease

## 2011-06-12 DIAGNOSIS — N186 End stage renal disease: Secondary | ICD-10-CM

## 2011-06-12 DIAGNOSIS — I2510 Atherosclerotic heart disease of native coronary artery without angina pectoris: Secondary | ICD-10-CM

## 2011-06-12 DIAGNOSIS — R0609 Other forms of dyspnea: Secondary | ICD-10-CM

## 2011-06-12 DIAGNOSIS — E782 Mixed hyperlipidemia: Secondary | ICD-10-CM

## 2011-06-12 DIAGNOSIS — R0600 Dyspnea, unspecified: Secondary | ICD-10-CM | POA: Insufficient documentation

## 2011-06-12 DIAGNOSIS — R0989 Other specified symptoms and signs involving the circulatory and respiratory systems: Secondary | ICD-10-CM

## 2011-06-12 DIAGNOSIS — E119 Type 2 diabetes mellitus without complications: Secondary | ICD-10-CM

## 2011-06-12 NOTE — Progress Notes (Signed)
Diabetic with multiple CRF's who moved from [place] with presumed CAD but never been cathed. On dialysis for the past year. Myovue 6/10 in our office normal and non-ischemic. Been maintained on ASA and Plavix. Sometimes takes Plavix every other day to save money. He gets dialysis in [facility] and Dr Clover Mealy is his primary nephrologist. There have been no hemodynamic problems on dialysis or chest pain. His BS has been suboptimally controlled due to dietary indiscretion and his insulin  was just increased.  Some exertional dyspnea that is worse than usual.  No PND or orthopnea.  Dialysis weight stable  Mild chronic LE edema  ROS: Denies fever, malais, weight loss, blurry vision, decreased visual acuity, cough, sputum,  hemoptysis, pleuritic pain, palpitaitons, heartburn, abdominal pain, melena, , claudication, or rash.  All other systems reviewed and negative  General: Affect appropriate Obese Fistula RUE and left wrist with keloids on LUE HEENT: normal Neck supple with no adenopathy JVP normal no bruits no thyromegaly Lungs clear with no wheezing and good diaphragmatic motion Heart:  S1/S2 no murmur,rub, gallop or click PMI normal Abdomen: benighn, BS positve, no tenderness, no AAA no bruit.  No HSM or HJR Distal pulses intact with no bruits Plus one bilateral edema Neuro non-focal Skin warm and dry No muscular weakness   Current Outpatient Prescriptions  Medication Sig Dispense Refill  . allopurinol (ZYLOPRIM) 100 MG tablet Take 100 mg by mouth daily.        [name] aspirin 325 MG tablet Take 325 mg by mouth daily.        . Calcium Acetate 667 MG TABS Take 1 tablet by mouth daily.       . clopidogrel (PLAVIX) 75 MG tablet Take 75 mg by mouth daily.        . colchicine 0.6 MG tablet Take 0.6 mg by mouth daily.        [name] gabapentin (NEURONTIN) 300 MG capsule Take 300 mg by mouth 3 (three) times daily.        . insulin NPH-insulin regular (HUMULIN 50/50) (50-50) 100 UNIT/ML injection Inject  into the skin 2 (two) times daily before a meal.        . metoprolol (TOPROL-XL) 50 MG 24 hr tablet Take 25 mg by mouth daily.        . multivitamin (RENA-VIT) TABS tablet Take 1 tablet by mouth daily.        . simvastatin (ZOCOR) 20 MG tablet Take 20 mg by mouth at bedtime.          Allergies  Review of patient's allergies indicates not on file.  Electrocardiogram: NSR  74 nonspecific ST/T wave changes LAD moderat LVH  Assessment and Plan

## 2011-06-12 NOTE — Assessment & Plan Note (Signed)
Presumed from history in [place].  No cath or documentation.  Nonischemic myovue last year.  Continue medical Rx

## 2011-06-12 NOTE — Patient Instructions (Signed)
Your physician recommends that you schedule a follow-up appointment in: [place]  Your physician recommends that you continue on your current medications as directed. Please refer to the Current Medication list given to you today.  Your physician has requested that you have an echocardiogram. Echocardiography is a painless test that uses sound waves to create images of your heart. It provides your doctor with information about the size and shape of your heart and how well your heart's chambers and valves are working. This procedure takes approximately one hour. There are no restrictions for this procedure. PT'S CONVENIENCE  DX DYSPNEA

## 2011-06-12 NOTE — Assessment & Plan Note (Signed)
Target A1c 6.5 or less.  Low carb diet

## 2011-06-12 NOTE — Assessment & Plan Note (Signed)
Continue dialysis.  Dry weight 115 kg.  Low protein diet  Fistula with good thrill in RUE

## 2011-06-12 NOTE — Assessment & Plan Note (Signed)
Likely from obesity and deconditioning.  Component of high out put with fistula in RUE and left wrist.  Echo

## 2011-06-12 NOTE — Assessment & Plan Note (Signed)
Cholesterol is at goal.  Continue current dose of statin and diet Rx.  No myalgias or side effects.  F/U  LFT's in 6 months. No results found for this basename: LDLCALC             

## 2011-06-19 ENCOUNTER — Ambulatory Visit (HOSPITAL_COMMUNITY): Payer: Medicare Other | Attending: Cardiovascular Disease | Admitting: Radiology

## 2011-06-19 DIAGNOSIS — E785 Hyperlipidemia, unspecified: Secondary | ICD-10-CM | POA: Insufficient documentation

## 2011-06-19 DIAGNOSIS — E119 Type 2 diabetes mellitus without complications: Secondary | ICD-10-CM | POA: Insufficient documentation

## 2011-06-19 DIAGNOSIS — I2510 Atherosclerotic heart disease of native coronary artery without angina pectoris: Secondary | ICD-10-CM | POA: Insufficient documentation

## 2011-06-19 DIAGNOSIS — N189 Chronic kidney disease, unspecified: Secondary | ICD-10-CM | POA: Insufficient documentation

## 2011-06-19 DIAGNOSIS — Z992 Dependence on renal dialysis: Secondary | ICD-10-CM | POA: Insufficient documentation

## 2011-06-19 DIAGNOSIS — R0602 Shortness of breath: Secondary | ICD-10-CM | POA: Insufficient documentation

## 2011-06-19 DIAGNOSIS — R0600 Dyspnea, unspecified: Secondary | ICD-10-CM

## 2011-08-06 ENCOUNTER — Telehealth: Payer: Self-pay | Admitting: *Deleted

## 2011-08-06 NOTE — Telephone Encounter (Signed)
PT'S WIFE AWARE OF ECHO RESULTS/CY

## 2011-08-13 ENCOUNTER — Other Ambulatory Visit (HOSPITAL_COMMUNITY): Payer: Self-pay | Admitting: Nephrology

## 2011-08-13 DIAGNOSIS — N186 End stage renal disease: Secondary | ICD-10-CM

## 2011-08-14 ENCOUNTER — Ambulatory Visit (HOSPITAL_COMMUNITY)
Admission: RE | Admit: 2011-08-14 | Discharge: 2011-08-14 | Disposition: A | Discharge status: Home / Self Care | Payer: Medicare Other | Source: Ambulatory Visit | Attending: Nephrology | Admitting: Nephrology

## 2011-08-14 ENCOUNTER — Other Ambulatory Visit (HOSPITAL_COMMUNITY): Payer: Self-pay | Admitting: Nephrology

## 2011-08-14 DIAGNOSIS — N186 End stage renal disease: Secondary | ICD-10-CM

## 2011-08-14 DIAGNOSIS — T82898A Other specified complication of vascular prosthetic devices, implants and grafts, initial encounter: Secondary | ICD-10-CM | POA: Insufficient documentation

## 2011-08-14 DIAGNOSIS — Y832 Surgical operation with anastomosis, bypass or graft as the cause of abnormal reaction of the patient, or of later complication, without mention of misadventure at the time of the procedure: Secondary | ICD-10-CM | POA: Insufficient documentation

## 2011-08-14 MED ORDER — IOHEXOL 300 MG/ML  SOLN
100.0000 mL | Freq: Once | INTRAMUSCULAR | Status: AC | PRN
  Administered 2011-08-14: 60 mL via INTRAVENOUS

## 2011-10-17 DIAGNOSIS — E119 Type 2 diabetes mellitus without complications: Secondary | ICD-10-CM | POA: Diagnosis not present

## 2011-10-17 DIAGNOSIS — D509 Iron deficiency anemia, unspecified: Secondary | ICD-10-CM | POA: Diagnosis not present

## 2011-10-17 DIAGNOSIS — N2581 Secondary hyperparathyroidism of renal origin: Secondary | ICD-10-CM | POA: Diagnosis not present

## 2011-10-17 DIAGNOSIS — D631 Anemia in chronic kidney disease: Secondary | ICD-10-CM | POA: Diagnosis not present

## 2011-10-17 DIAGNOSIS — Z992 Dependence on renal dialysis: Secondary | ICD-10-CM | POA: Diagnosis not present

## 2011-10-17 DIAGNOSIS — N186 End stage renal disease: Secondary | ICD-10-CM | POA: Diagnosis not present

## 2011-10-19 DIAGNOSIS — Z992 Dependence on renal dialysis: Secondary | ICD-10-CM | POA: Diagnosis not present

## 2011-10-19 DIAGNOSIS — N2581 Secondary hyperparathyroidism of renal origin: Secondary | ICD-10-CM | POA: Diagnosis not present

## 2011-10-19 DIAGNOSIS — D631 Anemia in chronic kidney disease: Secondary | ICD-10-CM | POA: Diagnosis not present

## 2011-10-19 DIAGNOSIS — D509 Iron deficiency anemia, unspecified: Secondary | ICD-10-CM | POA: Diagnosis not present

## 2011-10-19 DIAGNOSIS — N186 End stage renal disease: Secondary | ICD-10-CM | POA: Diagnosis not present

## 2011-10-19 DIAGNOSIS — E119 Type 2 diabetes mellitus without complications: Secondary | ICD-10-CM | POA: Diagnosis not present

## 2011-10-22 DIAGNOSIS — E119 Type 2 diabetes mellitus without complications: Secondary | ICD-10-CM | POA: Diagnosis not present

## 2011-10-22 DIAGNOSIS — Z992 Dependence on renal dialysis: Secondary | ICD-10-CM | POA: Diagnosis not present

## 2011-10-22 DIAGNOSIS — D509 Iron deficiency anemia, unspecified: Secondary | ICD-10-CM | POA: Diagnosis not present

## 2011-10-22 DIAGNOSIS — D631 Anemia in chronic kidney disease: Secondary | ICD-10-CM | POA: Diagnosis not present

## 2011-10-22 DIAGNOSIS — N2581 Secondary hyperparathyroidism of renal origin: Secondary | ICD-10-CM | POA: Diagnosis not present

## 2011-10-22 DIAGNOSIS — N186 End stage renal disease: Secondary | ICD-10-CM | POA: Diagnosis not present

## 2011-10-24 DIAGNOSIS — Z992 Dependence on renal dialysis: Secondary | ICD-10-CM | POA: Diagnosis not present

## 2011-10-24 DIAGNOSIS — D631 Anemia in chronic kidney disease: Secondary | ICD-10-CM | POA: Diagnosis not present

## 2011-10-24 DIAGNOSIS — D509 Iron deficiency anemia, unspecified: Secondary | ICD-10-CM | POA: Diagnosis not present

## 2011-10-24 DIAGNOSIS — E119 Type 2 diabetes mellitus without complications: Secondary | ICD-10-CM | POA: Diagnosis not present

## 2011-10-24 DIAGNOSIS — N186 End stage renal disease: Secondary | ICD-10-CM | POA: Diagnosis not present

## 2011-10-24 DIAGNOSIS — N2581 Secondary hyperparathyroidism of renal origin: Secondary | ICD-10-CM | POA: Diagnosis not present

## 2011-10-26 DIAGNOSIS — N2581 Secondary hyperparathyroidism of renal origin: Secondary | ICD-10-CM | POA: Diagnosis not present

## 2011-10-26 DIAGNOSIS — N186 End stage renal disease: Secondary | ICD-10-CM | POA: Diagnosis not present

## 2011-10-26 DIAGNOSIS — N039 Chronic nephritic syndrome with unspecified morphologic changes: Secondary | ICD-10-CM | POA: Diagnosis not present

## 2011-10-26 DIAGNOSIS — Z992 Dependence on renal dialysis: Secondary | ICD-10-CM | POA: Diagnosis not present

## 2011-10-26 DIAGNOSIS — E119 Type 2 diabetes mellitus without complications: Secondary | ICD-10-CM | POA: Diagnosis not present

## 2011-10-26 DIAGNOSIS — D509 Iron deficiency anemia, unspecified: Secondary | ICD-10-CM | POA: Diagnosis not present

## 2011-10-29 DIAGNOSIS — N2581 Secondary hyperparathyroidism of renal origin: Secondary | ICD-10-CM | POA: Diagnosis not present

## 2011-10-29 DIAGNOSIS — E119 Type 2 diabetes mellitus without complications: Secondary | ICD-10-CM | POA: Diagnosis not present

## 2011-10-29 DIAGNOSIS — N186 End stage renal disease: Secondary | ICD-10-CM | POA: Diagnosis not present

## 2011-10-29 DIAGNOSIS — D509 Iron deficiency anemia, unspecified: Secondary | ICD-10-CM | POA: Diagnosis not present

## 2011-10-29 DIAGNOSIS — D631 Anemia in chronic kidney disease: Secondary | ICD-10-CM | POA: Diagnosis not present

## 2011-10-29 DIAGNOSIS — Z992 Dependence on renal dialysis: Secondary | ICD-10-CM | POA: Diagnosis not present

## 2011-10-30 DIAGNOSIS — I10 Essential (primary) hypertension: Secondary | ICD-10-CM | POA: Diagnosis not present

## 2011-10-30 DIAGNOSIS — E7800 Pure hypercholesterolemia, unspecified: Secondary | ICD-10-CM | POA: Diagnosis not present

## 2011-10-30 DIAGNOSIS — N186 End stage renal disease: Secondary | ICD-10-CM | POA: Diagnosis not present

## 2011-10-31 DIAGNOSIS — E119 Type 2 diabetes mellitus without complications: Secondary | ICD-10-CM | POA: Diagnosis not present

## 2011-10-31 DIAGNOSIS — D509 Iron deficiency anemia, unspecified: Secondary | ICD-10-CM | POA: Diagnosis not present

## 2011-10-31 DIAGNOSIS — Z992 Dependence on renal dialysis: Secondary | ICD-10-CM | POA: Diagnosis not present

## 2011-10-31 DIAGNOSIS — N2581 Secondary hyperparathyroidism of renal origin: Secondary | ICD-10-CM | POA: Diagnosis not present

## 2011-10-31 DIAGNOSIS — N186 End stage renal disease: Secondary | ICD-10-CM | POA: Diagnosis not present

## 2011-10-31 DIAGNOSIS — D631 Anemia in chronic kidney disease: Secondary | ICD-10-CM | POA: Diagnosis not present

## 2011-11-02 DIAGNOSIS — D509 Iron deficiency anemia, unspecified: Secondary | ICD-10-CM | POA: Diagnosis not present

## 2011-11-02 DIAGNOSIS — N2581 Secondary hyperparathyroidism of renal origin: Secondary | ICD-10-CM | POA: Diagnosis not present

## 2011-11-02 DIAGNOSIS — N039 Chronic nephritic syndrome with unspecified morphologic changes: Secondary | ICD-10-CM | POA: Diagnosis not present

## 2011-11-02 DIAGNOSIS — N186 End stage renal disease: Secondary | ICD-10-CM | POA: Diagnosis not present

## 2011-11-02 DIAGNOSIS — E119 Type 2 diabetes mellitus without complications: Secondary | ICD-10-CM | POA: Diagnosis not present

## 2011-11-02 DIAGNOSIS — Z992 Dependence on renal dialysis: Secondary | ICD-10-CM | POA: Diagnosis not present

## 2011-11-04 ENCOUNTER — Emergency Department (HOSPITAL_COMMUNITY): Payer: Medicare Other

## 2011-11-04 ENCOUNTER — Emergency Department (HOSPITAL_COMMUNITY)
Admission: EM | Admit: 2011-11-04 | Discharge: 2011-11-04 | Disposition: A | Discharge status: Home / Self Care | Payer: Medicare Other | Attending: Emergency Medicine | Admitting: Emergency Medicine

## 2011-11-04 DIAGNOSIS — S8990XA Unspecified injury of unspecified lower leg, initial encounter: Secondary | ICD-10-CM | POA: Diagnosis not present

## 2011-11-04 DIAGNOSIS — S91309A Unspecified open wound, unspecified foot, initial encounter: Secondary | ICD-10-CM | POA: Diagnosis not present

## 2011-11-04 DIAGNOSIS — Z992 Dependence on renal dialysis: Secondary | ICD-10-CM | POA: Diagnosis not present

## 2011-11-04 DIAGNOSIS — E785 Hyperlipidemia, unspecified: Secondary | ICD-10-CM | POA: Diagnosis not present

## 2011-11-04 DIAGNOSIS — Z8619 Personal history of other infectious and parasitic diseases: Secondary | ICD-10-CM | POA: Insufficient documentation

## 2011-11-04 DIAGNOSIS — Z862 Personal history of diseases of the blood and blood-forming organs and certain disorders involving the immune mechanism: Secondary | ICD-10-CM | POA: Insufficient documentation

## 2011-11-04 DIAGNOSIS — Z7982 Long term (current) use of aspirin: Secondary | ICD-10-CM | POA: Diagnosis not present

## 2011-11-04 DIAGNOSIS — M79609 Pain in unspecified limb: Secondary | ICD-10-CM | POA: Insufficient documentation

## 2011-11-04 DIAGNOSIS — S99929A Unspecified injury of unspecified foot, initial encounter: Secondary | ICD-10-CM | POA: Insufficient documentation

## 2011-11-04 DIAGNOSIS — N186 End stage renal disease: Secondary | ICD-10-CM | POA: Diagnosis not present

## 2011-11-04 DIAGNOSIS — E119 Type 2 diabetes mellitus without complications: Secondary | ICD-10-CM | POA: Insufficient documentation

## 2011-11-04 DIAGNOSIS — M19079 Primary osteoarthritis, unspecified ankle and foot: Secondary | ICD-10-CM | POA: Diagnosis not present

## 2011-11-04 DIAGNOSIS — IMO0002 Reserved for concepts with insufficient information to code with codable children: Secondary | ICD-10-CM | POA: Insufficient documentation

## 2011-11-04 DIAGNOSIS — M79673 Pain in unspecified foot: Secondary | ICD-10-CM

## 2011-11-04 DIAGNOSIS — Z79899 Other long term (current) drug therapy: Secondary | ICD-10-CM | POA: Diagnosis not present

## 2011-11-04 DIAGNOSIS — Z8639 Personal history of other endocrine, nutritional and metabolic disease: Secondary | ICD-10-CM | POA: Insufficient documentation

## 2011-11-04 DIAGNOSIS — Z794 Long term (current) use of insulin: Secondary | ICD-10-CM | POA: Diagnosis not present

## 2011-11-04 MED ORDER — DOXYCYCLINE HYCLATE 100 MG PO CAPS: 100.0000 mg | ORAL_CAPSULE | Freq: Two times a day (BID) | ORAL | Status: AC

## 2011-11-04 NOTE — ED Notes (Signed)
Patient transported to X-ray 

## 2011-11-04 NOTE — ED Notes (Signed)
MD at bedside. 

## 2011-11-04 NOTE — ED Notes (Addendum)
Pt states he went to put his sock on this am and noticed that the bottom of his left foot was bleeding. Pt reports he is on blood thinners. Appears that bleeding has stopped at this time. Pt reports decreased sensation to feet due to diabetes. Pt is a dialysis pt with graft to right upper arm, MWF. Pt is on plavix.

## 2011-11-04 NOTE — ED Provider Notes (Signed)
History     CSN: [id number]  Arrival date & time 11/04/11  0940   First MD Initiated Contact with Patient 11/04/11 1007      Chief Complaint  Patient presents with  . Foot Injury    (Consider location/radiation/quality/duration/timing/severity/associated sxs/prior treatment) Patient is a 66 y.o. male presenting with foot injury. The history is provided by the patient. No language interpreter was used.  Foot Injury  The injury mechanism is unknown. The pain is present in the left foot. The quality of the pain is described as aching. The pain is at a severity of 3/10. The pain is mild. Associated symptoms include loss of motion. Pertinent negatives include no numbness, no inability to bear weight, no muscle weakness, no loss of sensation and no tingling. He reports no foreign bodies present.  Reports that he stepped on something this am and his foot started bleeding.  Puncture wound noted to L foot pad.  Bleeding stopped.  No foreign body visible.  Will get x-ray and give tetanus shot then put on antibiotics.  Patient is diabetic and on dialysis.    Past Medical History  Diagnosis Date  . Abnormal electrocardiogram   . Hyperlipidemia   . Retinopathy   . Gout   . ESRD (end stage renal disease)     on hemodialysis Mon, Wed, Fri  . Type II or unspecified type diabetes mellitus without mention of complication, not stated as uncontrolled     adult onset  . CRF (chronic renal failure)   . Erectile dysfunction     penile implant  . Hepatitis C     Past Surgical History  Procedure Date  . Penile prosthesis implant   . Fistula     RUE and wrist    No family history on file.  History  Substance Use Topics  . Smoking status: Former Smoker    Quit date: 06/11/1975  . Smokeless tobacco: Never Used  . Alcohol Use: No      Review of Systems  Neurological: Negative for tingling and numbness.  All other systems reviewed and are negative.    Allergies  Review of patient's  allergies indicates not on file.  Home Medications   Current Outpatient Rx  Name Route Sig Dispense Refill  . ALLOPURINOL 100 MG PO TABS Oral Take 100 mg by mouth daily.      . ASPIRIN 325 MG PO TABS Oral Take 325 mg by mouth daily.      [name] CALCIUM ACETATE 667 MG PO TABS Oral Take 1 tablet by mouth daily.     [name] CLOPIDOGREL BISULFATE 75 MG PO TABS Oral Take 75 mg by mouth daily.      . COLCHICINE 0.6 MG PO TABS Oral Take 0.6 mg by mouth daily.      [name] GABAPENTIN 300 MG PO CAPS Oral Take 300 mg by mouth 3 (three) times daily.      . INSULIN ISOPHANE & REGULAR (50-50) 100 UNIT/ML [place] SUSP Subcutaneous Inject into the skin 2 (two) times daily before a meal.      . METOPROLOL SUCCINATE ER 50 MG PO TB24 Oral Take 25 mg by mouth daily.      [name] RENA-VITE PO TABS Oral Take 1 tablet by mouth daily.      [name] SIMVASTATIN 20 MG PO TABS Oral Take 20 mg by mouth at bedtime.        BP 174/58  Pulse 67  Temp(Src) 97.3 F (36.3 C) (Oral)  Resp 18  SpO2 97%  Physical Exam  Nursing note and vitals reviewed. Constitutional: He is oriented to person, place, and time. He appears well-developed and well-nourished. No distress.  HENT:  Head: Normocephalic.  Eyes: Pupils are equal, round, and reactive to light.  Neck: Normal range of motion.  Cardiovascular: Normal rate.   Pulmonary/Chest: Effort normal.  Abdominal: Soft.  Musculoskeletal: Normal range of motion.  Neurological: He is alert and oriented to person, place, and time.  Skin: Skin is warm and dry. No erythema.  Psychiatric: He has a normal mood and affect.    ED Course  Procedures (including critical care time)  Labs Reviewed - No data to display No results found.   No diagnosis found.    MDM  Puncture wound to L foot pad.  Cleaned with safe clens and ns flush.  Tetanus updated. X-ray shows no foreign object.  Pt is a diabetic and started on antibiotic.  Will follow up with pcp this week.          Sheryle Hail, NP 11/04/11  [place], NP 11/04/11 1857

## 2011-11-05 DIAGNOSIS — N2581 Secondary hyperparathyroidism of renal origin: Secondary | ICD-10-CM | POA: Diagnosis not present

## 2011-11-05 DIAGNOSIS — N186 End stage renal disease: Secondary | ICD-10-CM | POA: Diagnosis not present

## 2011-11-05 DIAGNOSIS — E119 Type 2 diabetes mellitus without complications: Secondary | ICD-10-CM | POA: Diagnosis not present

## 2011-11-05 DIAGNOSIS — Z992 Dependence on renal dialysis: Secondary | ICD-10-CM | POA: Diagnosis not present

## 2011-11-05 DIAGNOSIS — D631 Anemia in chronic kidney disease: Secondary | ICD-10-CM | POA: Diagnosis not present

## 2011-11-05 DIAGNOSIS — D509 Iron deficiency anemia, unspecified: Secondary | ICD-10-CM | POA: Diagnosis not present

## 2011-11-05 NOTE — ED Provider Notes (Signed)
Medical screening examination/treatment/procedure(s) were performed by non-physician practitioner and as supervising physician I was immediately available for consultation/collaboration.   Ezequiel Essex, MD 11/05/11 1241

## 2011-11-07 DIAGNOSIS — E119 Type 2 diabetes mellitus without complications: Secondary | ICD-10-CM | POA: Diagnosis not present

## 2011-11-07 DIAGNOSIS — E1129 Type 2 diabetes mellitus with other diabetic kidney complication: Secondary | ICD-10-CM | POA: Diagnosis not present

## 2011-11-07 DIAGNOSIS — Z992 Dependence on renal dialysis: Secondary | ICD-10-CM | POA: Diagnosis not present

## 2011-11-07 DIAGNOSIS — D509 Iron deficiency anemia, unspecified: Secondary | ICD-10-CM | POA: Diagnosis not present

## 2011-11-07 DIAGNOSIS — N186 End stage renal disease: Secondary | ICD-10-CM | POA: Diagnosis not present

## 2011-11-07 DIAGNOSIS — N039 Chronic nephritic syndrome with unspecified morphologic changes: Secondary | ICD-10-CM | POA: Diagnosis not present

## 2011-11-07 DIAGNOSIS — N2581 Secondary hyperparathyroidism of renal origin: Secondary | ICD-10-CM | POA: Diagnosis not present

## 2011-11-09 DIAGNOSIS — N2581 Secondary hyperparathyroidism of renal origin: Secondary | ICD-10-CM | POA: Diagnosis not present

## 2011-11-09 DIAGNOSIS — Z992 Dependence on renal dialysis: Secondary | ICD-10-CM | POA: Diagnosis not present

## 2011-11-09 DIAGNOSIS — D509 Iron deficiency anemia, unspecified: Secondary | ICD-10-CM | POA: Diagnosis not present

## 2011-11-09 DIAGNOSIS — D631 Anemia in chronic kidney disease: Secondary | ICD-10-CM | POA: Diagnosis not present

## 2011-11-09 DIAGNOSIS — E119 Type 2 diabetes mellitus without complications: Secondary | ICD-10-CM | POA: Diagnosis not present

## 2011-11-09 DIAGNOSIS — N186 End stage renal disease: Secondary | ICD-10-CM | POA: Diagnosis not present

## 2011-11-12 ENCOUNTER — Other Ambulatory Visit (HOSPITAL_COMMUNITY): Payer: Self-pay | Admitting: Nephrology

## 2011-11-12 DIAGNOSIS — E119 Type 2 diabetes mellitus without complications: Secondary | ICD-10-CM | POA: Diagnosis not present

## 2011-11-12 DIAGNOSIS — N039 Chronic nephritic syndrome with unspecified morphologic changes: Secondary | ICD-10-CM | POA: Diagnosis not present

## 2011-11-12 DIAGNOSIS — N186 End stage renal disease: Secondary | ICD-10-CM | POA: Diagnosis not present

## 2011-11-12 DIAGNOSIS — D509 Iron deficiency anemia, unspecified: Secondary | ICD-10-CM | POA: Diagnosis not present

## 2011-11-12 DIAGNOSIS — Z992 Dependence on renal dialysis: Secondary | ICD-10-CM | POA: Diagnosis not present

## 2011-11-12 DIAGNOSIS — N2581 Secondary hyperparathyroidism of renal origin: Secondary | ICD-10-CM | POA: Diagnosis not present

## 2011-11-14 DIAGNOSIS — E119 Type 2 diabetes mellitus without complications: Secondary | ICD-10-CM | POA: Diagnosis not present

## 2011-11-14 DIAGNOSIS — D509 Iron deficiency anemia, unspecified: Secondary | ICD-10-CM | POA: Diagnosis not present

## 2011-11-14 DIAGNOSIS — Z992 Dependence on renal dialysis: Secondary | ICD-10-CM | POA: Diagnosis not present

## 2011-11-14 DIAGNOSIS — D631 Anemia in chronic kidney disease: Secondary | ICD-10-CM | POA: Diagnosis not present

## 2011-11-14 DIAGNOSIS — N2581 Secondary hyperparathyroidism of renal origin: Secondary | ICD-10-CM | POA: Diagnosis not present

## 2011-11-14 DIAGNOSIS — N186 End stage renal disease: Secondary | ICD-10-CM | POA: Diagnosis not present

## 2011-11-15 DIAGNOSIS — N186 End stage renal disease: Secondary | ICD-10-CM | POA: Diagnosis not present

## 2011-11-16 DIAGNOSIS — N2581 Secondary hyperparathyroidism of renal origin: Secondary | ICD-10-CM | POA: Diagnosis not present

## 2011-11-16 DIAGNOSIS — E119 Type 2 diabetes mellitus without complications: Secondary | ICD-10-CM | POA: Diagnosis not present

## 2011-11-16 DIAGNOSIS — D509 Iron deficiency anemia, unspecified: Secondary | ICD-10-CM | POA: Diagnosis not present

## 2011-11-16 DIAGNOSIS — N186 End stage renal disease: Secondary | ICD-10-CM | POA: Diagnosis not present

## 2011-11-19 DIAGNOSIS — D509 Iron deficiency anemia, unspecified: Secondary | ICD-10-CM | POA: Diagnosis not present

## 2011-11-19 DIAGNOSIS — N2581 Secondary hyperparathyroidism of renal origin: Secondary | ICD-10-CM | POA: Diagnosis not present

## 2011-11-19 DIAGNOSIS — E119 Type 2 diabetes mellitus without complications: Secondary | ICD-10-CM | POA: Diagnosis not present

## 2011-11-19 DIAGNOSIS — N186 End stage renal disease: Secondary | ICD-10-CM | POA: Diagnosis not present

## 2011-11-20 ENCOUNTER — Encounter (HOSPITAL_COMMUNITY): Payer: Self-pay | Admitting: Pharmacy Technician

## 2011-11-21 DIAGNOSIS — D509 Iron deficiency anemia, unspecified: Secondary | ICD-10-CM | POA: Diagnosis not present

## 2011-11-21 DIAGNOSIS — N186 End stage renal disease: Secondary | ICD-10-CM | POA: Diagnosis not present

## 2011-11-21 DIAGNOSIS — N2581 Secondary hyperparathyroidism of renal origin: Secondary | ICD-10-CM | POA: Diagnosis not present

## 2011-11-21 DIAGNOSIS — E119 Type 2 diabetes mellitus without complications: Secondary | ICD-10-CM | POA: Diagnosis not present

## 2011-11-22 ENCOUNTER — Other Ambulatory Visit (HOSPITAL_COMMUNITY): Payer: Self-pay | Admitting: Nephrology

## 2011-11-22 ENCOUNTER — Encounter (HOSPITAL_COMMUNITY): Payer: Self-pay

## 2011-11-22 ENCOUNTER — Ambulatory Visit (HOSPITAL_COMMUNITY)
Admission: RE | Admit: 2011-11-22 | Discharge: 2011-11-22 | Disposition: A | Discharge status: Home / Self Care | Payer: Medicare Other | Source: Ambulatory Visit | Attending: Nephrology | Admitting: Nephrology

## 2011-11-22 DIAGNOSIS — N186 End stage renal disease: Secondary | ICD-10-CM

## 2011-11-22 DIAGNOSIS — T82898A Other specified complication of vascular prosthetic devices, implants and grafts, initial encounter: Secondary | ICD-10-CM | POA: Insufficient documentation

## 2011-11-22 DIAGNOSIS — E119 Type 2 diabetes mellitus without complications: Secondary | ICD-10-CM | POA: Diagnosis not present

## 2011-11-22 DIAGNOSIS — Z992 Dependence on renal dialysis: Secondary | ICD-10-CM | POA: Insufficient documentation

## 2011-11-22 DIAGNOSIS — Y849 Medical procedure, unspecified as the cause of abnormal reaction of the patient, or of later complication, without mention of misadventure at the time of the procedure: Secondary | ICD-10-CM | POA: Insufficient documentation

## 2011-11-22 DIAGNOSIS — B1920 Unspecified viral hepatitis C without hepatic coma: Secondary | ICD-10-CM | POA: Diagnosis not present

## 2011-11-22 MED ORDER — IOHEXOL 300 MG/ML  SOLN
100.0000 mL | Freq: Once | INTRAMUSCULAR | Status: AC | PRN
  Administered 2011-11-22: 50 mL via INTRAVENOUS

## 2011-11-22 NOTE — Procedures (Signed)
Successful 51mm PTA of the AVF outflow stenoses No comp Stable Ready to use Full report in PACS

## 2011-11-22 NOTE — H&P (Signed)
Ernest [name] is an 66 y.o. male.   Chief Complaint: ESRD; Hep C; Right arm dialysis graft stenosis HPI: scheduled for angioplasty/stent placement  Past Medical History  Diagnosis Date  . Abnormal electrocardiogram   . Hyperlipidemia   . Retinopathy   . Gout   . ESRD (end stage renal disease)     on hemodialysis Mon, Wed, Fri  . Type II or unspecified type diabetes mellitus without mention of complication, not stated as uncontrolled     adult onset  . CRF (chronic renal failure)   . Erectile dysfunction     penile implant  . Hepatitis C     Past Surgical History  Procedure Date  . Penile prosthesis implant   . Fistula     RUE and wrist    No family history on file. Social History:  reports that he quit smoking about 36 years ago. He has never used smokeless tobacco. He reports that he does not drink alcohol or use illicit drugs.  Allergies: [name]  Medications Prior to Admission  Medication Sig Dispense Refill  . allopurinol (ZYLOPRIM) 100 MG tablet Take 100 mg by mouth daily.        [name] aspirin 325 MG tablet Take 325 mg by mouth daily.        . Calcium Acetate 667 MG TABS Take 1 tablet by mouth daily.       . clopidogrel (PLAVIX) 75 MG tablet Take 75 mg by mouth daily.        . colchicine 0.6 MG tablet Take 0.6 mg by mouth daily.        . insulin NPH (HUMULIN N,NOVOLIN N) 100 UNIT/ML injection Inject 30-50 Units into the skin 2 (two) times daily. Takes 50 units in the morning and 30 units at night      . insulin regular (NOVOLIN R,HUMULIN R) 100 units/mL injection Inject 30-40 Units into the skin 3 (three) times daily before [name]. Takes 40 units in the morning; Takes 30 units midday; Takes 40 units at night      . metoprolol (TOPROL-XL) 50 MG 24 hr tablet Take 25 mg by mouth daily.        . multivitamin (RENA-VIT) TABS tablet Take 1 tablet by mouth daily.        . simvastatin (ZOCOR) 20 MG tablet Take 20 mg by mouth at bedtime.         No current  facility-administered medications on file as of 11/22/2011.    No results found for this or any previous visit (from the past 48 hour(s)). No results found.  Review of Systems  Constitutional: Negative for fever.  Respiratory: Negative for cough.   Cardiovascular: Negative for chest pain.  Gastrointestinal: Negative for nausea, vomiting and abdominal pain.  Neurological: Negative for headaches.    There were no vitals taken for this visit. Physical Exam  Constitutional: He is oriented to person, place, and time. He appears well-developed and well-nourished.  HENT:  Head: Normocephalic.  Eyes: EOM are normal.  Neck: Normal range of motion.  Cardiovascular: Normal rate, regular rhythm and normal heart sounds.   No murmur heard. Respiratory: Effort normal and breath sounds normal. He has no wheezes.  GI: Soft. Bowel sounds are normal. There is no tenderness.  Musculoskeletal: Normal range of motion.  Neurological: He is alert and oriented to person, place, and time.  Skin: Skin is warm and dry.     Assessment/Plan Right arm dialysis graft stenosis Scheduled for  angioplasty/stent placement Pt aware of procedure benefits and risks and agreeable to proceed. Consent signed.  Ernest [name] A 11/22/2011, 8:10 AM

## 2011-11-23 DIAGNOSIS — N2581 Secondary hyperparathyroidism of renal origin: Secondary | ICD-10-CM | POA: Diagnosis not present

## 2011-11-23 DIAGNOSIS — E119 Type 2 diabetes mellitus without complications: Secondary | ICD-10-CM | POA: Diagnosis not present

## 2011-11-23 DIAGNOSIS — D509 Iron deficiency anemia, unspecified: Secondary | ICD-10-CM | POA: Diagnosis not present

## 2011-11-23 DIAGNOSIS — N186 End stage renal disease: Secondary | ICD-10-CM | POA: Diagnosis not present

## 2011-11-26 DIAGNOSIS — N2581 Secondary hyperparathyroidism of renal origin: Secondary | ICD-10-CM | POA: Diagnosis not present

## 2011-11-26 DIAGNOSIS — D509 Iron deficiency anemia, unspecified: Secondary | ICD-10-CM | POA: Diagnosis not present

## 2011-11-26 DIAGNOSIS — E119 Type 2 diabetes mellitus without complications: Secondary | ICD-10-CM | POA: Diagnosis not present

## 2011-11-26 DIAGNOSIS — N186 End stage renal disease: Secondary | ICD-10-CM | POA: Diagnosis not present

## 2011-11-28 DIAGNOSIS — D509 Iron deficiency anemia, unspecified: Secondary | ICD-10-CM | POA: Diagnosis not present

## 2011-11-28 DIAGNOSIS — N2581 Secondary hyperparathyroidism of renal origin: Secondary | ICD-10-CM | POA: Diagnosis not present

## 2011-11-28 DIAGNOSIS — N186 End stage renal disease: Secondary | ICD-10-CM | POA: Diagnosis not present

## 2011-11-28 DIAGNOSIS — E119 Type 2 diabetes mellitus without complications: Secondary | ICD-10-CM | POA: Diagnosis not present

## 2011-11-30 DIAGNOSIS — E119 Type 2 diabetes mellitus without complications: Secondary | ICD-10-CM | POA: Diagnosis not present

## 2011-11-30 DIAGNOSIS — D509 Iron deficiency anemia, unspecified: Secondary | ICD-10-CM | POA: Diagnosis not present

## 2011-11-30 DIAGNOSIS — N2581 Secondary hyperparathyroidism of renal origin: Secondary | ICD-10-CM | POA: Diagnosis not present

## 2011-11-30 DIAGNOSIS — N186 End stage renal disease: Secondary | ICD-10-CM | POA: Diagnosis not present

## 2011-12-03 DIAGNOSIS — N186 End stage renal disease: Secondary | ICD-10-CM | POA: Diagnosis not present

## 2011-12-03 DIAGNOSIS — N2581 Secondary hyperparathyroidism of renal origin: Secondary | ICD-10-CM | POA: Diagnosis not present

## 2011-12-03 DIAGNOSIS — D509 Iron deficiency anemia, unspecified: Secondary | ICD-10-CM | POA: Diagnosis not present

## 2011-12-03 DIAGNOSIS — E119 Type 2 diabetes mellitus without complications: Secondary | ICD-10-CM | POA: Diagnosis not present

## 2011-12-04 DIAGNOSIS — N2581 Secondary hyperparathyroidism of renal origin: Secondary | ICD-10-CM | POA: Diagnosis not present

## 2011-12-04 DIAGNOSIS — E119 Type 2 diabetes mellitus without complications: Secondary | ICD-10-CM | POA: Diagnosis not present

## 2011-12-04 DIAGNOSIS — D509 Iron deficiency anemia, unspecified: Secondary | ICD-10-CM | POA: Diagnosis not present

## 2011-12-04 DIAGNOSIS — N186 End stage renal disease: Secondary | ICD-10-CM | POA: Diagnosis not present

## 2011-12-05 DIAGNOSIS — N2581 Secondary hyperparathyroidism of renal origin: Secondary | ICD-10-CM | POA: Diagnosis not present

## 2011-12-05 DIAGNOSIS — E119 Type 2 diabetes mellitus without complications: Secondary | ICD-10-CM | POA: Diagnosis not present

## 2011-12-05 DIAGNOSIS — D509 Iron deficiency anemia, unspecified: Secondary | ICD-10-CM | POA: Diagnosis not present

## 2011-12-05 DIAGNOSIS — N186 End stage renal disease: Secondary | ICD-10-CM | POA: Diagnosis not present

## 2011-12-07 DIAGNOSIS — E119 Type 2 diabetes mellitus without complications: Secondary | ICD-10-CM | POA: Diagnosis not present

## 2011-12-07 DIAGNOSIS — D509 Iron deficiency anemia, unspecified: Secondary | ICD-10-CM | POA: Diagnosis not present

## 2011-12-07 DIAGNOSIS — N2581 Secondary hyperparathyroidism of renal origin: Secondary | ICD-10-CM | POA: Diagnosis not present

## 2011-12-07 DIAGNOSIS — N186 End stage renal disease: Secondary | ICD-10-CM | POA: Diagnosis not present

## 2011-12-10 DIAGNOSIS — N186 End stage renal disease: Secondary | ICD-10-CM | POA: Diagnosis not present

## 2011-12-10 DIAGNOSIS — D509 Iron deficiency anemia, unspecified: Secondary | ICD-10-CM | POA: Diagnosis not present

## 2011-12-10 DIAGNOSIS — N2581 Secondary hyperparathyroidism of renal origin: Secondary | ICD-10-CM | POA: Diagnosis not present

## 2011-12-10 DIAGNOSIS — E119 Type 2 diabetes mellitus without complications: Secondary | ICD-10-CM | POA: Diagnosis not present

## 2011-12-12 DIAGNOSIS — N2581 Secondary hyperparathyroidism of renal origin: Secondary | ICD-10-CM | POA: Diagnosis not present

## 2011-12-12 DIAGNOSIS — E119 Type 2 diabetes mellitus without complications: Secondary | ICD-10-CM | POA: Diagnosis not present

## 2011-12-12 DIAGNOSIS — D509 Iron deficiency anemia, unspecified: Secondary | ICD-10-CM | POA: Diagnosis not present

## 2011-12-12 DIAGNOSIS — N186 End stage renal disease: Secondary | ICD-10-CM | POA: Diagnosis not present

## 2011-12-13 DIAGNOSIS — N186 End stage renal disease: Secondary | ICD-10-CM | POA: Diagnosis not present

## 2011-12-14 DIAGNOSIS — E119 Type 2 diabetes mellitus without complications: Secondary | ICD-10-CM | POA: Diagnosis not present

## 2011-12-14 DIAGNOSIS — N186 End stage renal disease: Secondary | ICD-10-CM | POA: Diagnosis not present

## 2011-12-14 DIAGNOSIS — N2581 Secondary hyperparathyroidism of renal origin: Secondary | ICD-10-CM | POA: Diagnosis not present

## 2011-12-14 DIAGNOSIS — D509 Iron deficiency anemia, unspecified: Secondary | ICD-10-CM | POA: Diagnosis not present

## 2011-12-27 DIAGNOSIS — G589 Mononeuropathy, unspecified: Secondary | ICD-10-CM | POA: Diagnosis not present

## 2011-12-27 DIAGNOSIS — M79609 Pain in unspecified limb: Secondary | ICD-10-CM | POA: Diagnosis not present

## 2011-12-27 DIAGNOSIS — B351 Tinea unguium: Secondary | ICD-10-CM | POA: Diagnosis not present

## 2011-12-27 DIAGNOSIS — D638 Anemia in other chronic diseases classified elsewhere: Secondary | ICD-10-CM | POA: Insufficient documentation

## 2012-01-13 DIAGNOSIS — N186 End stage renal disease: Secondary | ICD-10-CM | POA: Diagnosis not present

## 2012-01-14 DIAGNOSIS — D509 Iron deficiency anemia, unspecified: Secondary | ICD-10-CM | POA: Diagnosis not present

## 2012-01-14 DIAGNOSIS — N2581 Secondary hyperparathyroidism of renal origin: Secondary | ICD-10-CM | POA: Diagnosis not present

## 2012-01-14 DIAGNOSIS — E119 Type 2 diabetes mellitus without complications: Secondary | ICD-10-CM | POA: Diagnosis not present

## 2012-01-14 DIAGNOSIS — Z992 Dependence on renal dialysis: Secondary | ICD-10-CM | POA: Diagnosis not present

## 2012-01-14 DIAGNOSIS — N186 End stage renal disease: Secondary | ICD-10-CM | POA: Diagnosis not present

## 2012-02-06 DIAGNOSIS — E1129 Type 2 diabetes mellitus with other diabetic kidney complication: Secondary | ICD-10-CM | POA: Diagnosis not present

## 2012-02-12 DIAGNOSIS — N186 End stage renal disease: Secondary | ICD-10-CM | POA: Diagnosis not present

## 2012-02-13 DIAGNOSIS — N186 End stage renal disease: Secondary | ICD-10-CM | POA: Diagnosis not present

## 2012-02-13 DIAGNOSIS — D509 Iron deficiency anemia, unspecified: Secondary | ICD-10-CM | POA: Diagnosis not present

## 2012-02-13 DIAGNOSIS — N2581 Secondary hyperparathyroidism of renal origin: Secondary | ICD-10-CM | POA: Diagnosis not present

## 2012-02-13 DIAGNOSIS — E119 Type 2 diabetes mellitus without complications: Secondary | ICD-10-CM | POA: Diagnosis not present

## 2012-02-13 DIAGNOSIS — D631 Anemia in chronic kidney disease: Secondary | ICD-10-CM | POA: Diagnosis not present

## 2012-02-16 DIAGNOSIS — N2581 Secondary hyperparathyroidism of renal origin: Secondary | ICD-10-CM | POA: Diagnosis not present

## 2012-02-16 DIAGNOSIS — N186 End stage renal disease: Secondary | ICD-10-CM | POA: Diagnosis not present

## 2012-02-25 DIAGNOSIS — E1139 Type 2 diabetes mellitus with other diabetic ophthalmic complication: Secondary | ICD-10-CM | POA: Diagnosis not present

## 2012-02-25 DIAGNOSIS — H3560 Retinal hemorrhage, unspecified eye: Secondary | ICD-10-CM | POA: Diagnosis not present

## 2012-02-25 DIAGNOSIS — H353290 Exudative age-related macular degeneration, unspecified eye, stage unspecified: Secondary | ICD-10-CM | POA: Diagnosis not present

## 2012-02-25 DIAGNOSIS — E11359 Type 2 diabetes mellitus with proliferative diabetic retinopathy without macular edema: Secondary | ICD-10-CM | POA: Diagnosis not present

## 2012-02-27 ENCOUNTER — Other Ambulatory Visit (HOSPITAL_COMMUNITY): Payer: Self-pay | Admitting: Nephrology

## 2012-02-27 DIAGNOSIS — N186 End stage renal disease: Secondary | ICD-10-CM

## 2012-02-28 ENCOUNTER — Encounter (HOSPITAL_COMMUNITY): Payer: Self-pay

## 2012-02-28 ENCOUNTER — Ambulatory Visit (HOSPITAL_COMMUNITY)
Admission: RE | Admit: 2012-02-28 | Discharge: 2012-02-28 | Disposition: A | Discharge status: Home / Self Care | Payer: Medicare Other | Source: Ambulatory Visit | Attending: Nephrology | Admitting: Nephrology

## 2012-02-28 ENCOUNTER — Other Ambulatory Visit (HOSPITAL_COMMUNITY): Payer: Self-pay | Admitting: Nephrology

## 2012-02-28 DIAGNOSIS — N186 End stage renal disease: Secondary | ICD-10-CM

## 2012-02-28 DIAGNOSIS — Z992 Dependence on renal dialysis: Secondary | ICD-10-CM | POA: Diagnosis not present

## 2012-02-28 DIAGNOSIS — T82898A Other specified complication of vascular prosthetic devices, implants and grafts, initial encounter: Secondary | ICD-10-CM | POA: Diagnosis not present

## 2012-02-28 DIAGNOSIS — E119 Type 2 diabetes mellitus without complications: Secondary | ICD-10-CM | POA: Insufficient documentation

## 2012-02-28 DIAGNOSIS — Y849 Medical procedure, unspecified as the cause of abnormal reaction of the patient, or of later complication, without mention of misadventure at the time of the procedure: Secondary | ICD-10-CM | POA: Insufficient documentation

## 2012-02-28 MED ORDER — FENTANYL CITRATE 0.05 MG/ML IJ SOLN
INTRAMUSCULAR | Status: DC | PRN
  Administered 2012-02-28: 50 ug via INTRAVENOUS

## 2012-02-28 MED ORDER — HEPARIN SODIUM (PORCINE) 1000 UNIT/ML IJ SOLN
INTRAMUSCULAR | Status: AC
  Filled 2012-02-28: qty 1

## 2012-02-28 MED ORDER — MIDAZOLAM HCL 5 MG/5ML IJ SOLN
INTRAMUSCULAR | Status: DC | PRN
  Administered 2012-02-28: 1 mg via INTRAVENOUS

## 2012-02-28 MED ORDER — GLUCOSE 40 % PO GEL
ORAL | Status: AC
  Administered 2012-02-28: 1 via ORAL
  Filled 2012-02-28: qty 1

## 2012-02-28 MED ORDER — HEPARIN SODIUM (PORCINE) 1000 UNIT/ML IJ SOLN
INTRAMUSCULAR | Status: DC | PRN
  Administered 2012-02-28: 3000 [IU] via INTRAVENOUS

## 2012-02-28 MED ORDER — ALTEPLASE 100 MG IV SOLR
INTRAVENOUS | Status: DC | PRN
  Administered 2012-02-28: 2 mg

## 2012-02-28 MED ORDER — IOHEXOL 300 MG/ML  SOLN
150.0000 mL | Freq: Once | INTRAMUSCULAR | Status: AC | PRN
  Administered 2012-02-28: 50 mL via INTRAVENOUS

## 2012-02-28 MED ORDER — GLUCOSE 40 % PO GEL: 1.0000 | Freq: Once | ORAL | Status: DC

## 2012-02-28 MED ORDER — FENTANYL CITRATE 0.05 MG/ML IJ SOLN
INTRAMUSCULAR | Status: AC
  Filled 2012-02-28: qty 4

## 2012-02-28 MED ORDER — DEXTROSE 50 % IV SOLN
INTRAVENOUS | Status: AC
  Filled 2012-02-28: qty 50

## 2012-02-28 MED ORDER — MIDAZOLAM HCL 2 MG/2ML IJ SOLN
INTRAMUSCULAR | Status: AC
  Filled 2012-02-28: qty 4

## 2012-02-28 MED ORDER — ALTEPLASE 100 MG IV SOLR
2.0000 mg | Freq: Once | INTRAVENOUS | Status: DC
  Filled 2012-02-28: qty 2

## 2012-02-28 NOTE — ED Notes (Signed)
Dr. Kathlene Cote aware CBG 15 minutes post Dextrose gel, patient asymtomatic. No further orders. IV Dextrose available. No IV access other than shuntogram access.

## 2012-02-28 NOTE — Discharge Summary (Signed)
Chief Complaint: Clotted dialysis graft HPI: Ernest [name] is an 66 y.o. male with ESRD who is sent for shuntogram due to low clearances, however, upon arrival to IR, access of his graft reveals it is actually acutely thrombosed. He is here for declot procedure now. Has otherwise been well.  Past Medical History:  Past Medical History  Diagnosis Date  . Abnormal electrocardiogram   . Hyperlipidemia   . Retinopathy   . Gout   . ESRD (end stage renal disease)     on hemodialysis Mon, Wed, Fri  . Type II or unspecified type diabetes mellitus without mention of complication, not stated as uncontrolled     adult onset  . CRF (chronic renal failure)   . Erectile dysfunction     penile implant  . Hepatitis C     Past Surgical History:  Past Surgical History  Procedure Date  . Penile prosthesis implant   . Fistula     RUE and wrist    Family History: History reviewed. No pertinent family history.  Social History:  reports that he quit smoking about 36 years ago. He has never used smokeless tobacco. He reports that he does not drink alcohol or use illicit drugs.  Allergies: [name]  Medications:       Disp  Refills  Start  End    allopurinol (ZYLOPRIM) 100 MG tablet         Sig - Route: Take 100 mg by mouth daily. - Oral    Class: Historical Med    Number of times this order has been changed since signing: 1         Order Audit Trail         aspirin 325 MG tablet         Sig - Route: Take 325 mg by mouth daily. - Oral    Class: Historical Med    Number of times this order has been changed since signing: 1         Order Audit Trail         Calcium Acetate 667 MG TABS         Sig - Route: Take 1 tablet by mouth daily. - Oral    Class: Historical Med    Number of times this order has been changed since signing: 3         Order Audit Trail         clopidogrel (PLAVIX) 75 MG tablet         Sig - Route: Take 75 mg by mouth daily. - Oral    Class: Historical Med      Number of times this order has been changed since signing: 1         Order Audit Trail         colchicine 0.6 MG tablet         Sig - Route: Take 0.6 mg by mouth daily. - Oral    Class: Historical Med    Number of times this order has been changed since signing: 1         Order Audit Trail         insulin NPH (HUMULIN N,NOVOLIN N) 100 UNIT/ML injection         Sig - Route: Inject 30-50 Units into the skin 2 (two) times daily. Takes 50 units in the morning and 30 units at night - Subcutaneous    Class: Historical Med  insulin regular (NOVOLIN R,HUMULIN R) 100 units/mL injection         Sig - Route: Inject 30-40 Units into the skin 3 (three) times daily before meals. Takes 40 units in the morning; Takes 30 units midday; Takes 40 units at night - Subcutaneous    Class: Historical Med    Number of times this order has been changed since signing: 1         Order Audit Trail         metoprolol (TOPROL-XL) 50 MG 24 hr tablet         Sig - Route: Take 25 mg by mouth daily. - Oral    Class: Historical Med    Number of times this order has been changed since signing: 1         Order Audit Trail         multivitamin (RENA-VIT) TABS tablet         Sig - Route: Take 1 tablet by mouth daily. - Oral    Class: Historical Med    Number of times this order has been changed since signing: 1         Order Audit Trail         simvastatin (ZOCOR) 20 MG tablet         Sig - Route: Take 20 mg by mouth at bedtime. - Oral    Class: Historical Med    Number of times this order has been changed since signing: 1          Please HPI for pertinent positives, otherwise complete 10 system ROS negative.  Physical Exam: There were no vitals taken for this visit. There is no height or weight on file to calculate BMI.   General Appearance:  Alert, cooperative, no distress, appears stated age  Head:  Normocephalic, without obvious abnormality, atraumatic  ENT: Unremarkable  Neck: Supple, symmetrical, trachea  midline, no adenopathy, thyroid: not enlarged, symmetric, no tenderness/mass/nodules  Lungs:   Clear to auscultation bilaterally, no w/r/r, respirations unlabored without use of accessory muscles.  Chest Wall:  No tenderness or deformity  Heart:  Regular rate and rhythm, S1, S2 normal, no murmur, rub or gallop. Carotids 2+ without bruit.  Extremities: Rt upper Ext with palpable AVF, but no thrill/pulse   No results found for this or any previous visit (from the past 48 hour(s)). No results found.  Assessment/Plan Thrombosed Rt UE AVF Proceed with Thrombolysis, poss angioplasty, poss temp access if needed. Procedure reviewed with pt, consent signed in chart.  Ascencion Dike [name] 02/28/2012, 9:58 AM

## 2012-02-28 NOTE — ED Notes (Signed)
Unable to obtain IV. Dr. Kathlene Cote will proceed and use fistula access.

## 2012-02-28 NOTE — Discharge Summary (Signed)
Agree 

## 2012-02-28 NOTE — ED Notes (Signed)
Dr Kathlene Cote aware SBP running 170-180's over  70's

## 2012-02-28 NOTE — ED Notes (Addendum)
Repeat CBG 53. Dr Kathlene Cote paged. Patient remains asymptomatic.

## 2012-02-28 NOTE — Progress Notes (Signed)
1240: Pt. Had lunch. Fingerstick blood sugar 71. -Sherren Mocha RN

## 2012-02-28 NOTE — Discharge Instructions (Signed)
Arteriovenous (AV) Access for Hemodialysis An arteriovenous (AV) access is a surgically created or placed tube that allows for repeated access to the blood in your body. This access is required for hemodialysis, a type of dialysis. Dialysis is a treatment process that filters and cleans the blood in order to eliminate toxic wastes from the body when the kidneys fail to do this on their own. There are several different access methods used for hemodialysis. ACCESS METHODS  Double lumen catheter. A flexible tube with 2 channels may be used on a temporary or long-term basis. A long-term use catheter often has a cuff that holds it in place. The catheter is surgically placed and tunneled under the skin. This catheter is often placed when dialysis is needed in an emergency, such as when the kidneys suddenly stop working. It may also be needed when a permanent AV access fails or has not yet been placed. A catheter is usually placed into one of the following:   Large vein under your collarbone (subclavian vein).   Large vein in your neck (jugular vein).   Temporarily, in the large vein in your groin (femoral vein).   AV graft. A man-made (synthetic) material may be used to connect an artery and vein in the arm or thigh. This graft takes around 2 weeks to develop (mature) and is often placed a few weeks prior to use. A graft can generally last from 1 to 2 years.   AV fistula. A minor surgical procedure may be done to connect an artery and vein, creating a fistula. This causes arterial blood to flow directly into a vein. The vein gets larger, allowing easier access for dialysis. The fistula takes around 12 weeks to mature and must be placed several months before dialysis is anticipated. A fistula provides the best access for hemodialysis and can last for several years.  It is critical that veins in patients at high risk to develop kidney failure are preserved. This may include avoiding blood pressure checks and  intravenous (IV) or lab draws from the arm. This maximizes the chance for creating a functioning AV access when needed. Although a fistula is the most desirable access to use for hemodialysis, it may not be possible. If the veins are not large enough or there is no time to wait for a fistula to mature, a graft or catheter may be used. RISKS AND COMPLICATIONS   Double lumen catheter. A catheter may develop serious infections. It may also develop a clot (thrombosis) and fail. A catheter can cause clots in the vein in which it is placed. A subclavian vein catheter is the most likely to cause thrombosis. When the subclavian vein clots, it makes it very difficult for the patient to sustain an AV graft or fistula. When possible, catheters should be avoided and a more permanent AV access should be placed.   AV graft. A graft may swell after surgery, but this should decrease as it heals. A graft may stop working properly due to thrombosis or the diameter of the tube getting smaller. The tube can eventually become blocked (stenosis). If a partial blockage is found relatively early, it can be treated. Left untreated, the stenosis will progress until the vessel is completely blocked. Infection may also occur.   AV fistula. Infection and thrombosis are the biggest risks with a fistula. However, the rates for infection and stenosis are lower with fistulas than the other 2 methods. Frequent thrombosis may require creating a backup fistula at another site.   This will allow for dialysis when one access is blocked.  HOME CARE INSTRUCTIONS   Keep the site of the cut (incision) clean and dry while it heals. This helps prevent infection.   If you have a catheter, do not shower while the incision is healing. After it is healed, ask your caregiver for recommendations on showering. The bandage (dressing) will be changed at the dialysis center. Do not remove this dressing. However, if it becomes wet or loose, a sterile gauze  dressing may be placed over the site and secured by placing adhesive tape on the edges.   If you have a graft or fistula, clean the site daily until it heals completely. Stitches will be removed, usually after about 10 to 14 days. Once the access is being used for dialysis, a small dressing will be placed over the needle sites after the treatment is done. Keep this dressing on for at least 12 hours. Keep your arm or thigh clean and dry during this time.   A small amount of bleeding is normal, especially if the access is new. If you have a large amount of bleeding and cannot stop it, this is not normal. Call your caregiver right away. You will be taught how to hold your graft or fistula sites to stop the bleeding.  If you have a graft or fistula:  A "bruit" is a noise that is heard with a stethoscope and a "thrill" is a vibration felt over the graft or fistula. The presence of the bruit and thrill indicate that the access is working. You will be taught to feel for the thrill each day. If this is not felt, the access may be clotted. Call your caregiver.   You may use the arm freely after the site heals. Keep the following in mind:   Avoid pressure on the arm.   Avoid lifting heavy objects with the arm.   Avoid sleeping on the arm with the graft or fistula.   Avoid wearing tight-sleeved shirts or jewelry around the graft or fistula.   Do not allow blood pressure monitoring or needle punctures on the side where the graft or fistula is located.   With permission from your caregiver, you may do exercises to help with blood flow through a fistula. These exercises involve squeezing a rubber ball or other soft objects as instructed.  SEEK MEDICAL CARE IF:   Chills develop.   You have an oral temperature above 102 F (38.9 C).   Swelling around the graft or fistula gets worse.   New pain develops.   Unusual bleeding develops.   Pus or other fluid (drainage) is seen at the AV access site.    Skin redness or red streaking is seen on the skin around, above, or below the AV access.  SEEK IMMEDIATE MEDICAL CARE IF:   Pain, numbness, or an unusual pale skin color develops in the hand on the side of your fistula.   Dizziness or weakness develops that you have not had before.   Shortness of breath develops.   Chest pain develops.   The AV access has bleeding that cannot be easily controlled.  Wear a medical alert bracelet to let caregivers know you are a dialysis patient, so they can care for your veins appropriately. Document Released: 12/22/2002 Document Revised: 09/20/2011 Document Reviewed: 02/28/2010 [facility] Patient Information 2012 [place].  Moderate Sedation, Adult Moderate sedation is given to help you relax or even sleep through a procedure. You may remain sleepy,  be clumsy, or have poor balance for several hours following this procedure. Arrange for a responsible adult, family member, or friend to take you home. A responsible adult should stay with you for at least 24 hours or until the medicines have worn off.  Do not participate in any activities where you could become injured for the next 24 hours, or until you feel normal again. Do not:   Drive.   Swim.   Ride a bicycle.   Operate heavy machinery.   Cook.   Use power tools.   Climb ladders.   Work at [employer].   Do not make important decisions or sign legal documents until you are improved.   Vomiting may occur if you eat too soon. When you can drink without vomiting, try water, juice, or soup. Try solid foods if you feel little or no nausea.   Only take over-the-counter or prescription medications for pain, discomfort, or fever as directed by your caregiver.If pain medications have been prescribed for you, ask your caregiver how soon it is safe to take them.   Make sure you and your family fully understands everything about the medication given to you. Make sure you understand what side  effects may occur.   You should not drink alcohol, take sleeping pills, or medications that cause drowsiness for at least 24 hours.   If you smoke, do not smoke alone.   If you are feeling better, you may resume normal activities 24 hours after receiving sedation.   Keep all appointments as scheduled. Follow all instructions.   Ask questions if you do not understand.  SEEK MEDICAL CARE IF:   Your skin is pale or bluish in color.   You continue to feel sick to your stomach (nauseous) or throw up (vomit).   Your pain is getting worse and not helped by medication.   You have bleeding or swelling.   You are still sleepy or feeling clumsy after 24 hours.  SEEK IMMEDIATE MEDICAL CARE IF:   You develop a rash.   You have difficulty breathing.   You develop any type of allergic problem.   You have a fever.  Document Released: 06/26/2001 Document Revised: 09/20/2011 Document Reviewed: 11/17/2007 [facility] Patient Information 2012 [place].

## 2012-02-28 NOTE — ED Notes (Signed)
Report to Teresa RN

## 2012-02-28 NOTE — ED Notes (Signed)
Family updated as to patient's status.

## 2012-02-28 NOTE — ED Notes (Signed)
CBG 59. Dr Kathlene Cote aware. Patient asymptomatic

## 2012-02-28 NOTE — Procedures (Signed)
Procedure:  Dialysis fistula declot Findings:  Cephalic stenosis treated with 6 mm, 8 mm and 7 mm balloons.  See full report.

## 2012-02-28 NOTE — ED Notes (Signed)
CBG 52. Asymptomatic. Awake oriented, not shaky or diaphoretic. Did not take any meds today. Dr. Kathlene Cote aware. See order.

## 2012-02-28 NOTE — ED Notes (Addendum)
CBG 61. Dr Kathlene Cote aware. Rouses easily to voice. No tremors no diaphoresis

## 2012-02-28 NOTE — ED Notes (Signed)
Drinking apple juice and eating [place] sandwich. May go home when CBG >70

## 2012-03-03 LAB — GLUCOSE, CAPILLARY
Glucose-Capillary: 52 mg/dL — ABNORMAL LOW (ref 70–99)
Glucose-Capillary: 53 mg/dL — ABNORMAL LOW (ref 70–99)
Glucose-Capillary: 59 mg/dL — ABNORMAL LOW (ref 70–99)
Glucose-Capillary: 71 mg/dL (ref 70–99)

## 2012-03-06 ENCOUNTER — Other Ambulatory Visit: Payer: Self-pay | Admitting: Nephrology

## 2012-03-06 ENCOUNTER — Ambulatory Visit
Admission: RE | Admit: 2012-03-06 | Discharge: 2012-03-06 | Disposition: A | Discharge status: Home / Self Care | Payer: Medicare Other | Source: Ambulatory Visit | Attending: Nephrology | Admitting: Nephrology

## 2012-03-06 DIAGNOSIS — R05 Cough: Secondary | ICD-10-CM

## 2012-03-06 DIAGNOSIS — R0602 Shortness of breath: Secondary | ICD-10-CM

## 2012-03-06 DIAGNOSIS — J984 Other disorders of lung: Secondary | ICD-10-CM | POA: Diagnosis not present

## 2012-03-06 DIAGNOSIS — J42 Unspecified chronic bronchitis: Secondary | ICD-10-CM | POA: Diagnosis not present

## 2012-03-08 ENCOUNTER — Emergency Department (HOSPITAL_COMMUNITY)
Admission: EM | Admit: 2012-03-08 | Discharge: 2012-03-08 | Disposition: A | Discharge status: Home / Self Care | Payer: Medicare Other | Attending: Emergency Medicine | Admitting: Emergency Medicine

## 2012-03-08 ENCOUNTER — Encounter (HOSPITAL_COMMUNITY): Payer: Self-pay

## 2012-03-08 ENCOUNTER — Emergency Department (HOSPITAL_COMMUNITY): Payer: Medicare Other

## 2012-03-08 DIAGNOSIS — N390 Urinary tract infection, site not specified: Secondary | ICD-10-CM | POA: Insufficient documentation

## 2012-03-08 DIAGNOSIS — Z992 Dependence on renal dialysis: Secondary | ICD-10-CM | POA: Insufficient documentation

## 2012-03-08 DIAGNOSIS — R509 Fever, unspecified: Secondary | ICD-10-CM | POA: Diagnosis not present

## 2012-03-08 DIAGNOSIS — E785 Hyperlipidemia, unspecified: Secondary | ICD-10-CM | POA: Insufficient documentation

## 2012-03-08 DIAGNOSIS — R42 Dizziness and giddiness: Secondary | ICD-10-CM | POA: Diagnosis not present

## 2012-03-08 DIAGNOSIS — Z79899 Other long term (current) drug therapy: Secondary | ICD-10-CM | POA: Insufficient documentation

## 2012-03-08 DIAGNOSIS — R0602 Shortness of breath: Secondary | ICD-10-CM | POA: Diagnosis not present

## 2012-03-08 DIAGNOSIS — N186 End stage renal disease: Secondary | ICD-10-CM | POA: Insufficient documentation

## 2012-03-08 LAB — DIFFERENTIAL
Basophils Absolute: 0 10*3/uL (ref 0.0–0.1)
Basophils Relative: 0 % (ref 0–1)
Eosinophils Absolute: 0.2 10*3/uL (ref 0.0–0.7)
Monocytes Absolute: 0.9 10*3/uL (ref 0.1–1.0)
Monocytes Relative: 9 % (ref 3–12)
Neutro Abs: 7.3 10*3/uL (ref 1.7–7.7)

## 2012-03-08 LAB — BASIC METABOLIC PANEL
BUN: 40 mg/dL — ABNORMAL HIGH (ref 6–23)
Calcium: 9.2 mg/dL (ref 8.4–10.5)
Creatinine, Ser: 8.67 mg/dL — ABNORMAL HIGH (ref 0.50–1.35)
GFR calc Af Amer: 7 mL/min — ABNORMAL LOW (ref >90)
GFR calc non Af Amer: 6 mL/min — ABNORMAL LOW (ref >90)

## 2012-03-08 LAB — URINALYSIS, ROUTINE W REFLEX MICROSCOPIC
Bilirubin Urine: NEGATIVE
Ketones, ur: NEGATIVE mg/dL
Nitrite: NEGATIVE
Protein, ur: 100 mg/dL — AB
Urobilinogen, UA: 0.2 mg/dL (ref 0.0–1.0)

## 2012-03-08 LAB — URINE MICROSCOPIC-ADD ON

## 2012-03-08 LAB — CBC
HCT: 27.3 % — ABNORMAL LOW (ref 39.0–52.0)
Hemoglobin: 9.1 g/dL — ABNORMAL LOW (ref 13.0–17.0)
MCH: 30.2 pg (ref 26.0–34.0)
MCHC: 33.3 g/dL (ref 30.0–36.0)
RDW: 14.7 % (ref 11.5–15.5)

## 2012-03-08 LAB — GLUCOSE, CAPILLARY

## 2012-03-08 MED ORDER — CIPROFLOXACIN HCL 500 MG PO TABS: 500.0000 mg | ORAL_TABLET | Freq: Two times a day (BID) | ORAL | Status: AC

## 2012-03-08 MED ORDER — CIPROFLOXACIN HCL 500 MG PO TABS
500.0000 mg | ORAL_TABLET | Freq: Once | ORAL | Status: AC
  Administered 2012-03-08: 500 mg via ORAL
  Filled 2012-03-08: qty 1

## 2012-03-08 NOTE — ED Notes (Addendum)
Pt with dialysis graft to right upper arm, MWF, full dose of dialysis on Friday. Pt states woke up this am with dizziness with ambulation, states dizziness resolved as the day went on. Pt denies any dizziness at this time. Reports cough and sob on Wednesday that resolved on its own. Pt denies chest pain or sob. Denies headache or blurred vision.

## 2012-03-08 NOTE — ED Notes (Signed)
Pt ambulated with a steady gait; VSS; A&Ox3; no signs of distress; respirations even and unlabored; skin warm and dry; pt has no questions at this time.

## 2012-03-08 NOTE — ED Notes (Signed)
Pt. Woke up with dizziness this am,. Denies any pain or sob or n/v Last dialysis was Friday.

## 2012-03-08 NOTE — ED Notes (Signed)
Pt knows that urine is needed

## 2012-03-08 NOTE — ED Provider Notes (Signed)
History     CSN: [id number]  Arrival date & time 03/08/12  1735   First MD Initiated Contact with Patient 03/08/12 1823      Chief Complaint  Patient presents with  . Dizziness    (Consider location/radiation/quality/duration/timing/severity/associated sxs/prior treatment) The history is provided by the patient.   patient states that he has had dizziness with standing since yesterday. He had some yesterday but got worse today. No chest pain or shortness of breath. He states he did have a cough on Wednesday it is improved. No nausea vomiting or diarrhea. He is dialyzed on Friday and had a full run away tomorrow. He states he feels fine sitting down. No headaches. No confusion. He states he's had episodes like this before his blood pressure medicines were changed.  Past Medical History  Diagnosis Date  . Abnormal electrocardiogram   . Hyperlipidemia   . Retinopathy   . Gout   . ESRD (end stage renal disease)     on hemodialysis Mon, Wed, Fri  . Type II or unspecified type diabetes mellitus without mention of complication, not stated as uncontrolled     adult onset  . CRF (chronic renal failure)   . Erectile dysfunction     penile implant  . Hepatitis C     Past Surgical History  Procedure Date  . Penile prosthesis implant   . Fistula     RUE and wrist    No family history on file.  History  Substance Use Topics  . Smoking status: Former Smoker    Quit date: 06/11/1975  . Smokeless tobacco: Never Used  . Alcohol Use: No      Review of Systems  Constitutional: Negative for activity change and appetite change.  HENT: Negative for neck stiffness.   Eyes: Negative for pain.  Respiratory: Negative for chest tightness and shortness of breath.   Cardiovascular: Negative for chest pain and leg swelling.  Gastrointestinal: Negative for nausea, vomiting, abdominal pain and diarrhea.  Genitourinary: Negative for flank pain.  Musculoskeletal: Negative for back pain.    Skin: Negative for rash.  Neurological: Positive for light-headedness. Negative for weakness, numbness and headaches.  Psychiatric/Behavioral: Negative for behavioral problems.    Allergies  Review of patient's allergies indicates no known allergies.  Home Medications   Current Outpatient Rx  Name Route Sig Dispense Refill  . ALLOPURINOL 100 MG PO TABS Oral Take 100 mg by mouth daily.      [name] AMITRIPTYLINE HCL 25 MG PO TABS Oral Take 25 mg by mouth at bedtime.    . ASPIRIN 325 MG PO TABS Oral Take 325 mg by mouth daily.      [name] CALCIUM ACETATE 667 MG PO TABS Oral Take 1 tablet by mouth daily.     [name] CINACALCET HCL 30 MG PO TABS Oral Take 30 mg by mouth daily.    [name] CLOPIDOGREL BISULFATE 75 MG PO TABS Oral Take 75 mg by mouth daily.      . COLCHICINE 0.6 MG PO TABS Oral Take 0.6 mg by mouth daily.      . INSULIN ISOPHANE HUMAN 100 UNIT/ML [place] SUSP Subcutaneous Inject 30-50 Units into the skin 2 (two) times daily. Takes 50 units in the morning and 30 units at night    . INSULIN REGULAR HUMAN 100 UNIT/ML IJ SOLN Subcutaneous Inject 30-40 Units into the skin 3 (three) times daily before meals. Takes 40 units in the morning; Takes 30 units midday; Takes 40 units at night    .  METOPROLOL SUCCINATE ER 50 MG PO TB24 Oral Take 25 mg by mouth daily.      [name] SEVELAMER CARBONATE 800 MG PO TABS Oral Take 1,600 mg by mouth 3 (three) times daily with meals.    [name] SIMVASTATIN 20 MG PO TABS Oral Take 20 mg by mouth at bedtime.      [name] CIPROFLOXACIN HCL 500 MG PO TABS Oral Take 1 tablet (500 mg total) by mouth every 12 (twelve) hours. 5 tablet 0  . RENA-VITE PO TABS Oral Take 1 tablet by mouth daily.        BP 134/64  Pulse 71  Temp(Src) 97.8 F (36.6 C) (Oral)  Resp 20  SpO2 99%  Physical Exam  Nursing note and vitals reviewed. Constitutional: He is oriented to person, place, and time. He appears well-developed and well-nourished.  HENT:  Head: Normocephalic and atraumatic.  Eyes: EOM are normal.  Pupils are equal, round, and reactive to light.  Neck: Normal range of motion. Neck supple.  Cardiovascular: Normal rate, regular rhythm and normal heart sounds.   No murmur heard. Pulmonary/Chest: Effort normal and breath sounds normal.  Abdominal: Soft. Bowel sounds are normal. He exhibits no distension and no mass. There is no tenderness. There is no rebound and no guarding.  Musculoskeletal: Normal range of motion. He exhibits no edema.  Neurological: He is alert and oriented to person, place, and time. No cranial nerve deficit.  Skin: Skin is warm and dry.  Psychiatric: He has a normal mood and affect.    ED Course  Procedures (including critical care time)  Labs Reviewed  GLUCOSE, CAPILLARY - Abnormal; Notable for the following:    Glucose-Capillary 164 (*)    All other components within normal limits  CBC - Abnormal; Notable for the following:    WBC 10.7 (*)    RBC 3.01 (*)    Hemoglobin 9.1 (*)    HCT 27.3 (*)    All other components within normal limits  BASIC METABOLIC PANEL - Abnormal; Notable for the following:    Sodium 133 (*)    Chloride 94 (*)    Glucose, Bld 178 (*)    BUN 40 (*)    Creatinine, Ser 8.67 (*)    GFR calc non Af Amer 6 (*)    GFR calc Af Amer 7 (*)    All other components within normal limits  URINALYSIS, ROUTINE W REFLEX MICROSCOPIC - Abnormal; Notable for the following:    APPearance CLOUDY (*)    Glucose, UA 100 (*)    Hgb urine dipstick MODERATE (*)    Protein, ur 100 (*)    Leukocytes, UA SMALL (*)    All other components within normal limits  URINE MICROSCOPIC-ADD ON - Abnormal; Notable for the following:    Bacteria, UA MANY (*)    Casts GRANULAR CAST (*)    All other components within normal limits  DIFFERENTIAL  TROPONIN I  URINE CULTURE   Dg Chest 2 View  03/08/2012  *RADIOLOGY REPORT*  Clinical Data: Dizziness.  Shortness of breath.  Fever.  Renal failure.  CHEST - 2 VIEW  Comparison: 03/06/2012  Findings: Low lung volumes  are seen, however both lungs are clear. Heart size is within normal limits.  IMPRESSION: Low lung volumes.  No acute findings.  Original Report Authenticated By: Marlaine Hind, M.D.     1. UTI (urinary tract infection)     Date: 03/08/2012  Rate: 63  Rhythm: normal sinus rhythm  QRS Axis: normal  Intervals: normal  ST/T Wave abnormalities: normal  Conduction Disutrbances:none  Narrative Interpretation:   Old EKG Reviewed: unchanged     MDM  Patient with dizziness with standing. Laboratories overall reassuring. He has apparent UTI. Culture was sent. He'll be discharged home and followup at dialysis in 2 days.        Jasper Riling. Alvino Chapel, MD 03/08/12 2155

## 2012-03-08 NOTE — Discharge Instructions (Signed)

## 2012-03-10 LAB — URINE CULTURE
Colony Count: NO GROWTH
Culture  Setup Time: 201305260540
Culture: NO GROWTH

## 2012-03-14 DIAGNOSIS — N186 End stage renal disease: Secondary | ICD-10-CM | POA: Diagnosis not present

## 2012-03-17 DIAGNOSIS — N186 End stage renal disease: Secondary | ICD-10-CM | POA: Diagnosis not present

## 2012-03-17 DIAGNOSIS — N2581 Secondary hyperparathyroidism of renal origin: Secondary | ICD-10-CM | POA: Diagnosis not present

## 2012-03-17 DIAGNOSIS — D631 Anemia in chronic kidney disease: Secondary | ICD-10-CM | POA: Diagnosis not present

## 2012-03-17 DIAGNOSIS — D509 Iron deficiency anemia, unspecified: Secondary | ICD-10-CM | POA: Diagnosis not present

## 2012-03-17 DIAGNOSIS — E119 Type 2 diabetes mellitus without complications: Secondary | ICD-10-CM | POA: Diagnosis not present

## 2012-03-21 DIAGNOSIS — E1139 Type 2 diabetes mellitus with other diabetic ophthalmic complication: Secondary | ICD-10-CM | POA: Diagnosis not present

## 2012-03-21 DIAGNOSIS — E11359 Type 2 diabetes mellitus with proliferative diabetic retinopathy without macular edema: Secondary | ICD-10-CM | POA: Diagnosis not present

## 2012-04-02 DIAGNOSIS — B351 Tinea unguium: Secondary | ICD-10-CM | POA: Diagnosis not present

## 2012-04-02 DIAGNOSIS — M79609 Pain in unspecified limb: Secondary | ICD-10-CM | POA: Diagnosis not present

## 2012-04-03 DIAGNOSIS — N189 Chronic kidney disease, unspecified: Secondary | ICD-10-CM | POA: Diagnosis not present

## 2012-04-03 DIAGNOSIS — Z01818 Encounter for other preprocedural examination: Secondary | ICD-10-CM | POA: Diagnosis not present

## 2012-04-13 DIAGNOSIS — N186 End stage renal disease: Secondary | ICD-10-CM | POA: Diagnosis not present

## 2012-04-14 DIAGNOSIS — N186 End stage renal disease: Secondary | ICD-10-CM | POA: Diagnosis not present

## 2012-04-14 DIAGNOSIS — D631 Anemia in chronic kidney disease: Secondary | ICD-10-CM | POA: Diagnosis not present

## 2012-04-14 DIAGNOSIS — E119 Type 2 diabetes mellitus without complications: Secondary | ICD-10-CM | POA: Diagnosis not present

## 2012-04-14 DIAGNOSIS — N2581 Secondary hyperparathyroidism of renal origin: Secondary | ICD-10-CM | POA: Diagnosis not present

## 2012-04-14 DIAGNOSIS — D509 Iron deficiency anemia, unspecified: Secondary | ICD-10-CM | POA: Diagnosis not present

## 2012-05-07 DIAGNOSIS — E1129 Type 2 diabetes mellitus with other diabetic kidney complication: Secondary | ICD-10-CM | POA: Diagnosis not present

## 2012-05-14 DIAGNOSIS — N186 End stage renal disease: Secondary | ICD-10-CM | POA: Diagnosis not present

## 2012-05-16 DIAGNOSIS — N2581 Secondary hyperparathyroidism of renal origin: Secondary | ICD-10-CM | POA: Diagnosis not present

## 2012-05-16 DIAGNOSIS — N186 End stage renal disease: Secondary | ICD-10-CM | POA: Diagnosis not present

## 2012-05-16 DIAGNOSIS — D509 Iron deficiency anemia, unspecified: Secondary | ICD-10-CM | POA: Diagnosis not present

## 2012-05-16 DIAGNOSIS — E119 Type 2 diabetes mellitus without complications: Secondary | ICD-10-CM | POA: Diagnosis not present

## 2012-05-19 DIAGNOSIS — E119 Type 2 diabetes mellitus without complications: Secondary | ICD-10-CM | POA: Diagnosis not present

## 2012-05-19 DIAGNOSIS — N186 End stage renal disease: Secondary | ICD-10-CM | POA: Diagnosis not present

## 2012-05-19 DIAGNOSIS — D509 Iron deficiency anemia, unspecified: Secondary | ICD-10-CM | POA: Diagnosis not present

## 2012-05-19 DIAGNOSIS — N2581 Secondary hyperparathyroidism of renal origin: Secondary | ICD-10-CM | POA: Diagnosis not present

## 2012-05-21 DIAGNOSIS — N2581 Secondary hyperparathyroidism of renal origin: Secondary | ICD-10-CM | POA: Diagnosis not present

## 2012-05-21 DIAGNOSIS — D509 Iron deficiency anemia, unspecified: Secondary | ICD-10-CM | POA: Diagnosis not present

## 2012-05-21 DIAGNOSIS — E119 Type 2 diabetes mellitus without complications: Secondary | ICD-10-CM | POA: Diagnosis not present

## 2012-05-21 DIAGNOSIS — N186 End stage renal disease: Secondary | ICD-10-CM | POA: Diagnosis not present

## 2012-05-23 DIAGNOSIS — N2581 Secondary hyperparathyroidism of renal origin: Secondary | ICD-10-CM | POA: Diagnosis not present

## 2012-05-23 DIAGNOSIS — D509 Iron deficiency anemia, unspecified: Secondary | ICD-10-CM | POA: Diagnosis not present

## 2012-05-23 DIAGNOSIS — N186 End stage renal disease: Secondary | ICD-10-CM | POA: Diagnosis not present

## 2012-05-23 DIAGNOSIS — E119 Type 2 diabetes mellitus without complications: Secondary | ICD-10-CM | POA: Diagnosis not present

## 2012-05-26 DIAGNOSIS — N2581 Secondary hyperparathyroidism of renal origin: Secondary | ICD-10-CM | POA: Diagnosis not present

## 2012-05-26 DIAGNOSIS — E119 Type 2 diabetes mellitus without complications: Secondary | ICD-10-CM | POA: Diagnosis not present

## 2012-05-26 DIAGNOSIS — D509 Iron deficiency anemia, unspecified: Secondary | ICD-10-CM | POA: Diagnosis not present

## 2012-05-26 DIAGNOSIS — N186 End stage renal disease: Secondary | ICD-10-CM | POA: Diagnosis not present

## 2012-05-27 ENCOUNTER — Ambulatory Visit (HOSPITAL_COMMUNITY)
Admission: RE | Admit: 2012-05-27 | Discharge: 2012-05-27 | Disposition: A | Discharge status: Home / Self Care | Payer: Medicare Other | Source: Ambulatory Visit | Attending: Nephrology | Admitting: Nephrology

## 2012-05-27 ENCOUNTER — Other Ambulatory Visit (HOSPITAL_COMMUNITY): Payer: Self-pay | Admitting: Nephrology

## 2012-05-27 DIAGNOSIS — N186 End stage renal disease: Secondary | ICD-10-CM

## 2012-05-27 DIAGNOSIS — B1920 Unspecified viral hepatitis C without hepatic coma: Secondary | ICD-10-CM | POA: Insufficient documentation

## 2012-05-27 DIAGNOSIS — M109 Gout, unspecified: Secondary | ICD-10-CM | POA: Diagnosis not present

## 2012-05-27 DIAGNOSIS — I871 Compression of vein: Secondary | ICD-10-CM | POA: Insufficient documentation

## 2012-05-27 DIAGNOSIS — Y832 Surgical operation with anastomosis, bypass or graft as the cause of abnormal reaction of the patient, or of later complication, without mention of misadventure at the time of the procedure: Secondary | ICD-10-CM | POA: Insufficient documentation

## 2012-05-27 DIAGNOSIS — T82898A Other specified complication of vascular prosthetic devices, implants and grafts, initial encounter: Secondary | ICD-10-CM | POA: Insufficient documentation

## 2012-05-27 DIAGNOSIS — E785 Hyperlipidemia, unspecified: Secondary | ICD-10-CM | POA: Diagnosis not present

## 2012-05-27 DIAGNOSIS — E119 Type 2 diabetes mellitus without complications: Secondary | ICD-10-CM | POA: Diagnosis not present

## 2012-05-27 MED ORDER — FENTANYL CITRATE 0.05 MG/ML IJ SOLN
INTRAMUSCULAR | Status: AC
  Filled 2012-05-27: qty 2

## 2012-05-27 MED ORDER — MIDAZOLAM HCL 5 MG/5ML IJ SOLN
INTRAMUSCULAR | Status: DC | PRN
  Administered 2012-05-27: 1 mg via INTRAVENOUS

## 2012-05-27 MED ORDER — IOHEXOL 300 MG/ML  SOLN
100.0000 mL | Freq: Once | INTRAMUSCULAR | Status: AC | PRN
  Administered 2012-05-27: 70 mL via INTRAVENOUS

## 2012-05-27 MED ORDER — FENTANYL CITRATE 0.05 MG/ML IJ SOLN
INTRAMUSCULAR | Status: DC | PRN
  Administered 2012-05-27: 50 ug via INTRAVENOUS

## 2012-05-27 MED ORDER — MIDAZOLAM HCL 2 MG/2ML IJ SOLN
INTRAMUSCULAR | Status: AC
  Filled 2012-05-27: qty 2

## 2012-05-27 NOTE — H&P (Signed)
Ernest [name] is an 66 y.o. male.   Chief Complaint: poor access flow HPI: RUE AVF for PTA  Past Medical History  Diagnosis Date  . Abnormal electrocardiogram   . Hyperlipidemia   . Retinopathy   . Gout   . ESRD (end stage renal disease)     on hemodialysis Mon, Wed, Fri  . Type II or unspecified type diabetes mellitus without mention of complication, not stated as uncontrolled     adult onset  . CRF (chronic renal failure)   . Erectile dysfunction     penile implant  . Hepatitis C     Past Surgical History  Procedure Date  . Penile prosthesis implant   . Fistula     RUE and wrist    No family history on file. Social History:  reports that he quit smoking about 36 years ago. He has never used smokeless tobacco. He reports that he does not drink alcohol or use illicit drugs.  Allergies: [name]  Meds - see list  Review of Systems  Constitutional: Negative.     There were no vitals taken for this visit. Physical Exam  Constitutional: He is oriented to person, place, and time. He appears well-developed and well-nourished.  HENT:  Head: Normocephalic.  Cardiovascular: Normal rate and regular rhythm.   Respiratory: Effort normal and breath sounds normal.  GI: Soft.  Neurological: He is alert and oriented to person, place, and time.  Skin: Skin is warm and dry.  Psychiatric: He has a normal mood and affect.     Assessment/Plan Venous stenosis. PTA.  Deiondra Denley,ART A 05/27/2012, 3:15 PM

## 2012-05-27 NOTE — Procedures (Signed)
Venous PTA 6 mm R cephalic vein No comp

## 2012-05-28 DIAGNOSIS — N2581 Secondary hyperparathyroidism of renal origin: Secondary | ICD-10-CM | POA: Diagnosis not present

## 2012-05-28 DIAGNOSIS — N186 End stage renal disease: Secondary | ICD-10-CM | POA: Diagnosis not present

## 2012-05-28 DIAGNOSIS — E119 Type 2 diabetes mellitus without complications: Secondary | ICD-10-CM | POA: Diagnosis not present

## 2012-05-28 DIAGNOSIS — D509 Iron deficiency anemia, unspecified: Secondary | ICD-10-CM | POA: Diagnosis not present

## 2012-05-30 DIAGNOSIS — N186 End stage renal disease: Secondary | ICD-10-CM | POA: Diagnosis not present

## 2012-05-30 DIAGNOSIS — N2581 Secondary hyperparathyroidism of renal origin: Secondary | ICD-10-CM | POA: Diagnosis not present

## 2012-05-30 DIAGNOSIS — D509 Iron deficiency anemia, unspecified: Secondary | ICD-10-CM | POA: Diagnosis not present

## 2012-05-30 DIAGNOSIS — E119 Type 2 diabetes mellitus without complications: Secondary | ICD-10-CM | POA: Diagnosis not present

## 2012-06-02 DIAGNOSIS — E119 Type 2 diabetes mellitus without complications: Secondary | ICD-10-CM | POA: Diagnosis not present

## 2012-06-02 DIAGNOSIS — N2581 Secondary hyperparathyroidism of renal origin: Secondary | ICD-10-CM | POA: Diagnosis not present

## 2012-06-02 DIAGNOSIS — N186 End stage renal disease: Secondary | ICD-10-CM | POA: Diagnosis not present

## 2012-06-02 DIAGNOSIS — D509 Iron deficiency anemia, unspecified: Secondary | ICD-10-CM | POA: Diagnosis not present

## 2012-06-04 DIAGNOSIS — D509 Iron deficiency anemia, unspecified: Secondary | ICD-10-CM | POA: Diagnosis not present

## 2012-06-04 DIAGNOSIS — N186 End stage renal disease: Secondary | ICD-10-CM | POA: Diagnosis not present

## 2012-06-04 DIAGNOSIS — E119 Type 2 diabetes mellitus without complications: Secondary | ICD-10-CM | POA: Diagnosis not present

## 2012-06-04 DIAGNOSIS — N2581 Secondary hyperparathyroidism of renal origin: Secondary | ICD-10-CM | POA: Diagnosis not present

## 2012-06-06 DIAGNOSIS — N2581 Secondary hyperparathyroidism of renal origin: Secondary | ICD-10-CM | POA: Diagnosis not present

## 2012-06-06 DIAGNOSIS — E119 Type 2 diabetes mellitus without complications: Secondary | ICD-10-CM | POA: Diagnosis not present

## 2012-06-06 DIAGNOSIS — D509 Iron deficiency anemia, unspecified: Secondary | ICD-10-CM | POA: Diagnosis not present

## 2012-06-06 DIAGNOSIS — N186 End stage renal disease: Secondary | ICD-10-CM | POA: Diagnosis not present

## 2012-06-09 DIAGNOSIS — N2581 Secondary hyperparathyroidism of renal origin: Secondary | ICD-10-CM | POA: Diagnosis not present

## 2012-06-09 DIAGNOSIS — D509 Iron deficiency anemia, unspecified: Secondary | ICD-10-CM | POA: Diagnosis not present

## 2012-06-09 DIAGNOSIS — N186 End stage renal disease: Secondary | ICD-10-CM | POA: Diagnosis not present

## 2012-06-09 DIAGNOSIS — E119 Type 2 diabetes mellitus without complications: Secondary | ICD-10-CM | POA: Diagnosis not present

## 2012-06-11 DIAGNOSIS — N2581 Secondary hyperparathyroidism of renal origin: Secondary | ICD-10-CM | POA: Diagnosis not present

## 2012-06-11 DIAGNOSIS — E119 Type 2 diabetes mellitus without complications: Secondary | ICD-10-CM | POA: Diagnosis not present

## 2012-06-11 DIAGNOSIS — D509 Iron deficiency anemia, unspecified: Secondary | ICD-10-CM | POA: Diagnosis not present

## 2012-06-11 DIAGNOSIS — N186 End stage renal disease: Secondary | ICD-10-CM | POA: Diagnosis not present

## 2012-06-13 DIAGNOSIS — E119 Type 2 diabetes mellitus without complications: Secondary | ICD-10-CM | POA: Diagnosis not present

## 2012-06-13 DIAGNOSIS — D509 Iron deficiency anemia, unspecified: Secondary | ICD-10-CM | POA: Diagnosis not present

## 2012-06-13 DIAGNOSIS — N186 End stage renal disease: Secondary | ICD-10-CM | POA: Diagnosis not present

## 2012-06-13 DIAGNOSIS — N2581 Secondary hyperparathyroidism of renal origin: Secondary | ICD-10-CM | POA: Diagnosis not present

## 2012-06-14 DIAGNOSIS — N186 End stage renal disease: Secondary | ICD-10-CM | POA: Diagnosis not present

## 2012-06-16 DIAGNOSIS — E119 Type 2 diabetes mellitus without complications: Secondary | ICD-10-CM | POA: Diagnosis not present

## 2012-06-16 DIAGNOSIS — N2581 Secondary hyperparathyroidism of renal origin: Secondary | ICD-10-CM | POA: Diagnosis not present

## 2012-06-16 DIAGNOSIS — Z23 Encounter for immunization: Secondary | ICD-10-CM | POA: Diagnosis not present

## 2012-06-16 DIAGNOSIS — N186 End stage renal disease: Secondary | ICD-10-CM | POA: Diagnosis not present

## 2012-06-16 DIAGNOSIS — D509 Iron deficiency anemia, unspecified: Secondary | ICD-10-CM | POA: Diagnosis not present

## 2012-07-10 ENCOUNTER — Encounter: Payer: Self-pay | Admitting: Cardiovascular Disease

## 2012-07-10 ENCOUNTER — Ambulatory Visit (INDEPENDENT_AMBULATORY_CARE_PROVIDER_SITE_OTHER): Payer: Medicare Other | Admitting: Cardiovascular Disease

## 2012-07-10 VITALS — BP 153/77 | HR 96 | Ht 69.0 in | Wt 257.0 lb

## 2012-07-10 DIAGNOSIS — N186 End stage renal disease: Secondary | ICD-10-CM | POA: Diagnosis not present

## 2012-07-10 DIAGNOSIS — E119 Type 2 diabetes mellitus without complications: Secondary | ICD-10-CM

## 2012-07-10 DIAGNOSIS — E782 Mixed hyperlipidemia: Secondary | ICD-10-CM

## 2012-07-10 DIAGNOSIS — I2510 Atherosclerotic heart disease of native coronary artery without angina pectoris: Secondary | ICD-10-CM

## 2012-07-10 NOTE — Addendum Note (Signed)
Addended by: Devra Dopp E on: 07/10/2012 08:53 AM   Modules accepted: Orders

## 2012-07-10 NOTE — Assessment & Plan Note (Signed)
Stable with no angina and good activity level.  Continue medical Rx Myovue to be done as part of renal transplant assessment

## 2012-07-10 NOTE — Patient Instructions (Signed)
Your physician wants you to follow-up in:  Ernest [name] will receive a reminder letter in the mail two months in advance. If you don't receive a letter, please call our office to schedule the follow-up appointment.Your physician recommends that you continue on your current medications as directed. Please refer to the Current Medication list given to you today.  Your physician has requested that you have a lexiscan myoview. For further information please visit [email or web address]. Please follow instruction sheet, as given. DX PRE OP

## 2012-07-10 NOTE — Assessment & Plan Note (Signed)
Fistula worked on with good thrill  F/U nephrology  Needs to loose weight and have myovue prior to any serious consideration for transplant

## 2012-07-10 NOTE — Progress Notes (Signed)
Patient ID: Ernest [name], male   DOB: Feb 26, 1946, 66 y.o.   MRN: [id number] Diabetic with multiple CRF's who moved from [place] with presumed CAD but never been cathed. On dialysis for the past year. Myovue 6/10 in our office normal and non-ischemic. Been maintained on ASA and Plavix. Sometimes takes Plavix every other day to save money. He gets dialysis in [facility] and Dr Clover Mealy is his primary nephrologist. There have been no hemodynamic problems on dialysis or chest pain. His BS has been suboptimally controlled due to dietary indiscretion and his insulin was just increased. Some exertional dyspnea that is worse than usual. No PND or orthopnea. Dialysis weight stable Mild chronic LE edema  Dry weight 115 kg  On transplant list at [facility]  They are requesting myovue for cardiac evaluation.  Just had work done on RUE graft by IR.  Still needs to loose 15 lbs before any consideration for transplant   ROS: Denies fever, malais, weight loss, blurry vision, decreased visual acuity, cough, sputum, SOB, hemoptysis, pleuritic pain, palpitaitons, heartburn, abdominal pain, melena, lower extremity edema, claudication, or rash.  All other systems reviewed and negative  General: Affect appropriate Obese black male HEENT: normal Neck supple with no adenopathy JVP normal no bruits no thyromegaly Lungs clear with no wheezing and good diaphragmatic motion Heart:  S1/S2 no murmur, no rub, gallop or click PMI normal Abdomen: benighn, BS positve, no tenderness, no AAA no bruit.  No HSM or HJR Poor right radial pulse  Thrill in RUE fistula old fistula in LUE No edema Neuro non-focal Skin warm and dry No muscular weakness   Current Outpatient Prescriptions  Medication Sig Dispense Refill  . allopurinol (ZYLOPRIM) 100 MG tablet Take 100 mg by mouth daily.        [name] amitriptyline (ELAVIL) 25 MG tablet Take 25 mg by mouth at bedtime.      [name] aspirin 325 MG tablet Take 325 mg by mouth daily.        . Calcium  Acetate 667 MG TABS Take 1 tablet by mouth daily.       . clopidogrel (PLAVIX) 75 MG tablet Take 75 mg by mouth daily.        . colchicine 0.6 MG tablet Take 0.6 mg by mouth daily.        . insulin NPH (HUMULIN N,NOVOLIN N) 100 UNIT/ML injection Inject 30-50 Units into the skin 2 (two) times daily. Takes 50 units in the morning and 30 units at night      . insulin regular (NOVOLIN R,HUMULIN R) 100 units/mL injection Inject 30-40 Units into the skin 3 (three) times daily before meals. Takes 40 units in the morning; Takes 30 units midday; Takes 40 units at night      . multivitamin (RENA-VIT) TABS tablet Take 1 tablet by mouth daily.        . sevelamer (RENVELA) 800 MG tablet Take 1,600 mg by mouth 3 (three) times daily with meals.      . sevelamer (RENVELA) 800 MG tablet Take 800 mg by mouth 3 (three) times daily with meals.      . simvastatin (ZOCOR) 20 MG tablet Take 20 mg by mouth at bedtime.          Allergies  Review of patient's allergies indicates no known allergies.  Electrocardiogram:  Assessment and Plan

## 2012-07-10 NOTE — Assessment & Plan Note (Signed)
Cholesterol is at goal.  Continue current dose of statin and diet Rx.  No myalgias or side effects.  F/U  LFT's in 6 months. No results found for this basename: LDLCALC             

## 2012-07-10 NOTE — Assessment & Plan Note (Signed)
Discussed low carb diet.  Target hemoglobin A1c is 6.5 or less.  Continue current medications.  

## 2012-07-14 DIAGNOSIS — N186 End stage renal disease: Secondary | ICD-10-CM | POA: Diagnosis not present

## 2012-07-16 DIAGNOSIS — N186 End stage renal disease: Secondary | ICD-10-CM | POA: Diagnosis not present

## 2012-07-16 DIAGNOSIS — N2581 Secondary hyperparathyroidism of renal origin: Secondary | ICD-10-CM | POA: Diagnosis not present

## 2012-07-16 DIAGNOSIS — D509 Iron deficiency anemia, unspecified: Secondary | ICD-10-CM | POA: Diagnosis not present

## 2012-07-18 DIAGNOSIS — N186 End stage renal disease: Secondary | ICD-10-CM | POA: Diagnosis not present

## 2012-07-18 DIAGNOSIS — N2581 Secondary hyperparathyroidism of renal origin: Secondary | ICD-10-CM | POA: Diagnosis not present

## 2012-07-18 DIAGNOSIS — D509 Iron deficiency anemia, unspecified: Secondary | ICD-10-CM | POA: Diagnosis not present

## 2012-07-21 DIAGNOSIS — N2581 Secondary hyperparathyroidism of renal origin: Secondary | ICD-10-CM | POA: Diagnosis not present

## 2012-07-21 DIAGNOSIS — N186 End stage renal disease: Secondary | ICD-10-CM | POA: Diagnosis not present

## 2012-07-21 DIAGNOSIS — D509 Iron deficiency anemia, unspecified: Secondary | ICD-10-CM | POA: Diagnosis not present

## 2012-07-22 DIAGNOSIS — N529 Male erectile dysfunction, unspecified: Secondary | ICD-10-CM | POA: Diagnosis not present

## 2012-07-23 DIAGNOSIS — N186 End stage renal disease: Secondary | ICD-10-CM | POA: Diagnosis not present

## 2012-07-23 DIAGNOSIS — D509 Iron deficiency anemia, unspecified: Secondary | ICD-10-CM | POA: Diagnosis not present

## 2012-07-23 DIAGNOSIS — N2581 Secondary hyperparathyroidism of renal origin: Secondary | ICD-10-CM | POA: Diagnosis not present

## 2012-07-25 DIAGNOSIS — D509 Iron deficiency anemia, unspecified: Secondary | ICD-10-CM | POA: Diagnosis not present

## 2012-07-25 DIAGNOSIS — N186 End stage renal disease: Secondary | ICD-10-CM | POA: Diagnosis not present

## 2012-07-25 DIAGNOSIS — N2581 Secondary hyperparathyroidism of renal origin: Secondary | ICD-10-CM | POA: Diagnosis not present

## 2012-07-28 DIAGNOSIS — N186 End stage renal disease: Secondary | ICD-10-CM | POA: Diagnosis not present

## 2012-07-28 DIAGNOSIS — N2581 Secondary hyperparathyroidism of renal origin: Secondary | ICD-10-CM | POA: Diagnosis not present

## 2012-07-28 DIAGNOSIS — D509 Iron deficiency anemia, unspecified: Secondary | ICD-10-CM | POA: Diagnosis not present

## 2012-07-29 DIAGNOSIS — B351 Tinea unguium: Secondary | ICD-10-CM | POA: Diagnosis not present

## 2012-07-29 DIAGNOSIS — M79609 Pain in unspecified limb: Secondary | ICD-10-CM | POA: Diagnosis not present

## 2012-07-30 DIAGNOSIS — N186 End stage renal disease: Secondary | ICD-10-CM | POA: Diagnosis not present

## 2012-07-30 DIAGNOSIS — N2581 Secondary hyperparathyroidism of renal origin: Secondary | ICD-10-CM | POA: Diagnosis not present

## 2012-07-30 DIAGNOSIS — D509 Iron deficiency anemia, unspecified: Secondary | ICD-10-CM | POA: Diagnosis not present

## 2012-07-31 ENCOUNTER — Ambulatory Visit (HOSPITAL_COMMUNITY): Payer: Medicare Other | Attending: Cardiovascular Disease | Admitting: Radiology

## 2012-07-31 VITALS — BP 131/66 | Ht 69.0 in | Wt 252.0 lb

## 2012-07-31 DIAGNOSIS — R9431 Abnormal electrocardiogram [ECG] [EKG]: Secondary | ICD-10-CM | POA: Diagnosis not present

## 2012-07-31 DIAGNOSIS — N186 End stage renal disease: Secondary | ICD-10-CM | POA: Insufficient documentation

## 2012-07-31 DIAGNOSIS — I999 Unspecified disorder of circulatory system: Secondary | ICD-10-CM | POA: Insufficient documentation

## 2012-07-31 DIAGNOSIS — R0989 Other specified symptoms and signs involving the circulatory and respiratory systems: Secondary | ICD-10-CM | POA: Insufficient documentation

## 2012-07-31 DIAGNOSIS — R0609 Other forms of dyspnea: Secondary | ICD-10-CM | POA: Insufficient documentation

## 2012-07-31 DIAGNOSIS — I2510 Atherosclerotic heart disease of native coronary artery without angina pectoris: Secondary | ICD-10-CM

## 2012-07-31 DIAGNOSIS — R0602 Shortness of breath: Secondary | ICD-10-CM

## 2012-07-31 DIAGNOSIS — E119 Type 2 diabetes mellitus without complications: Secondary | ICD-10-CM | POA: Insufficient documentation

## 2012-07-31 MED ORDER — REGADENOSON 0.4 MG/5ML IV SOLN
0.4000 mg | Freq: Once | INTRAVENOUS | Status: AC
  Administered 2012-07-31: 0.4 mg via INTRAVENOUS

## 2012-07-31 MED ORDER — TECHNETIUM TC 99M SESTAMIBI GENERIC - CARDIOLITE
33.0000 | Freq: Once | INTRAVENOUS | Status: AC | PRN
  Administered 2012-07-31: 33 via INTRAVENOUS

## 2012-07-31 MED ORDER — TECHNETIUM TC 99M SESTAMIBI GENERIC - CARDIOLITE
11.0000 | Freq: Once | INTRAVENOUS | Status: AC | PRN
  Administered 2012-07-31: 11 via INTRAVENOUS

## 2012-07-31 NOTE — Progress Notes (Signed)
[facility] NUCLEAR [facility] [id number] [place] [address] [phone]  Cardiology Nuclear Med Study  Ernest [name] is a 66 y.o. male     MRN : [id number]     DOB: 1946-05-02  Procedure Date: 07/31/2012  Nuclear Med Background Indication for Stress Test:  Evaluation for Ischemia and Surgical Clearance- Pending Renal Transplant @ [facility] History:  Abnormal EKG, ESRD:Dialysis, presumed CAD, 03/2009: MPS: (-) ischemia EF: 57% NL 06/2011 ECHO: EF: 60% mod LVH,   Cardiac Risk Factors: History of Smoking, IDDM Type 2 and Lipids  Symptoms:  DOE and SOB   Nuclear Pre-Procedure Caffeine/Decaff Intake:  None NPO After: 8:00pm   Lungs:  clear O2 Sat: 95% on room air. IV 0.9% NS with Angio Cath:  20g  IV Site: L Forearm  IV Started by:  Crissie Figures, RN  Chest Size (in):  52+ Cup Size: n/a  Height: 5\' 9"  (1.753 m)  Weight:  252 lb (114.306 kg)  BMI:  Body mass index is 37.21 kg/(m^2). Tech Comments:  BS @ 5:30 A=111    Nuclear Med Study 1 or 2 day study: 1 day  Stress Test Type:  Lexiscan  Reading MD: [name], MD  Order Authorizing Provider:  [name], MD  Resting Radionuclide: Technetium 48m Sestamibi  Resting Radionuclide Dose: 11.0 mCi   Stress Radionuclide:  Technetium 13m Sestamibi  Stress Radionuclide Dose: 33.0 mCi           Stress Protocol Rest HR: 64 Stress HR: 76  Rest BP: 131/66 Stress BP: 145/65  Exercise Time (min): n/a METS: n/a   Predicted Max HR: 157 bpm % Max HR: 48.41 bpm Rate Pressure Product: 11020   Dose of Adenosine (mg):  n/a Dose of Lexiscan: 0.4 mg  Dose of Atropine (mg): n/a Dose of Dobutamine: n/a mcg/kg/min (at max HR)  Stress Test Technologist: Perrin Maltese, EMT-P  Nuclear Technologist:  Charlton Amor, CNMT     Rest Procedure:  Myocardial perfusion imaging was performed at rest 45 minutes following the intravenous administration of Technetium 86m Sestamibi. Rest ECG: NSR with non-specific  ST-T wave changes  Stress Procedure:  The patient received IV Lexiscan 0.4 mg over 15-seconds.  Technetium 61m Sestamibi injected at 30-seconds.  There were no significant changes, sob, and nausea with Lexiscan.  Quantitative spect images were obtained after a 45 minute delay. Stress ECG: No significant change from baseline ECG  QPS Raw Data Images:  Patient motion noted. Stress Images:  There is decreased uptake in the inferior wall. Rest Images:  Normal homogeneous uptake in all areas of the myocardium. Subtraction (SDS):  These findings are consistent with ischemia. Transient Ischemic Dilatation (Normal <1.22):  0.98 Lung/Heart Ratio (Normal <0.45):  0.46  Quantitative Gated Spect Images QGS EDV:  152 ml QGS ESV:  91 ml  Impression Exercise Capacity:  Lexiscan with no exercise. BP Response:  Normal blood pressure response. Clinical Symptoms:  There is dyspnea. ECG Impression:  No significant ST segment change suggestive of ischemia. Comparison with Prior Nuclear Study: No images to compare  Overall Impression:  Small area of mid and apical inferior wall ischemia Diffuse hypokinesis worse in the apex EF 40%  LV Ejection Fraction: 40%.  LV Wall Motion:  Diffuse hypokinesis worse in the apex    .[name]

## 2012-08-01 DIAGNOSIS — N2581 Secondary hyperparathyroidism of renal origin: Secondary | ICD-10-CM | POA: Diagnosis not present

## 2012-08-01 DIAGNOSIS — D509 Iron deficiency anemia, unspecified: Secondary | ICD-10-CM | POA: Diagnosis not present

## 2012-08-01 DIAGNOSIS — N186 End stage renal disease: Secondary | ICD-10-CM | POA: Diagnosis not present

## 2012-08-04 DIAGNOSIS — D509 Iron deficiency anemia, unspecified: Secondary | ICD-10-CM | POA: Diagnosis not present

## 2012-08-04 DIAGNOSIS — N2581 Secondary hyperparathyroidism of renal origin: Secondary | ICD-10-CM | POA: Diagnosis not present

## 2012-08-04 DIAGNOSIS — N186 End stage renal disease: Secondary | ICD-10-CM | POA: Diagnosis not present

## 2012-08-06 DIAGNOSIS — N186 End stage renal disease: Secondary | ICD-10-CM | POA: Diagnosis not present

## 2012-08-06 DIAGNOSIS — N2581 Secondary hyperparathyroidism of renal origin: Secondary | ICD-10-CM | POA: Diagnosis not present

## 2012-08-06 DIAGNOSIS — E1129 Type 2 diabetes mellitus with other diabetic kidney complication: Secondary | ICD-10-CM | POA: Diagnosis not present

## 2012-08-06 DIAGNOSIS — D509 Iron deficiency anemia, unspecified: Secondary | ICD-10-CM | POA: Diagnosis not present

## 2012-08-08 DIAGNOSIS — N186 End stage renal disease: Secondary | ICD-10-CM | POA: Diagnosis not present

## 2012-08-08 DIAGNOSIS — N2581 Secondary hyperparathyroidism of renal origin: Secondary | ICD-10-CM | POA: Diagnosis not present

## 2012-08-08 DIAGNOSIS — D509 Iron deficiency anemia, unspecified: Secondary | ICD-10-CM | POA: Diagnosis not present

## 2012-08-11 DIAGNOSIS — N186 End stage renal disease: Secondary | ICD-10-CM | POA: Diagnosis not present

## 2012-08-11 DIAGNOSIS — N2581 Secondary hyperparathyroidism of renal origin: Secondary | ICD-10-CM | POA: Diagnosis not present

## 2012-08-11 DIAGNOSIS — D509 Iron deficiency anemia, unspecified: Secondary | ICD-10-CM | POA: Diagnosis not present

## 2012-08-13 DIAGNOSIS — N186 End stage renal disease: Secondary | ICD-10-CM | POA: Diagnosis not present

## 2012-08-13 DIAGNOSIS — D509 Iron deficiency anemia, unspecified: Secondary | ICD-10-CM | POA: Diagnosis not present

## 2012-08-13 DIAGNOSIS — N2581 Secondary hyperparathyroidism of renal origin: Secondary | ICD-10-CM | POA: Diagnosis not present

## 2012-08-14 DIAGNOSIS — N186 End stage renal disease: Secondary | ICD-10-CM | POA: Diagnosis not present

## 2012-08-15 DIAGNOSIS — E119 Type 2 diabetes mellitus without complications: Secondary | ICD-10-CM | POA: Diagnosis not present

## 2012-08-15 DIAGNOSIS — D509 Iron deficiency anemia, unspecified: Secondary | ICD-10-CM | POA: Diagnosis not present

## 2012-08-15 DIAGNOSIS — N186 End stage renal disease: Secondary | ICD-10-CM | POA: Diagnosis not present

## 2012-08-15 DIAGNOSIS — N2581 Secondary hyperparathyroidism of renal origin: Secondary | ICD-10-CM | POA: Diagnosis not present

## 2012-08-21 ENCOUNTER — Encounter: Payer: Self-pay | Admitting: Nurse Practitioner

## 2012-08-21 ENCOUNTER — Other Ambulatory Visit: Payer: Self-pay | Admitting: Nurse Practitioner

## 2012-08-21 ENCOUNTER — Ambulatory Visit (INDEPENDENT_AMBULATORY_CARE_PROVIDER_SITE_OTHER): Payer: Medicare Other | Admitting: Nurse Practitioner

## 2012-08-21 VITALS — BP 160/80 | HR 84 | Ht 69.0 in | Wt 255.8 lb

## 2012-08-21 DIAGNOSIS — R9439 Abnormal result of other cardiovascular function study: Secondary | ICD-10-CM

## 2012-08-21 DIAGNOSIS — R079 Chest pain, unspecified: Secondary | ICD-10-CM | POA: Diagnosis not present

## 2012-08-21 DIAGNOSIS — Z01810 Encounter for preprocedural cardiovascular examination: Secondary | ICD-10-CM

## 2012-08-21 LAB — BASIC METABOLIC PANEL
BUN: 39 mg/dL — ABNORMAL HIGH (ref 6–23)
CO2: 27 mEq/L (ref 19–32)
Calcium: 9.1 mg/dL (ref 8.4–10.5)
Chloride: 97 mEq/L (ref 96–112)
Creatinine, Ser: 8.2 mg/dL (ref 0.4–1.5)
GFR: 8.56 mL/min — CL (ref >60.00)
Glucose, Bld: 330 mg/dL — ABNORMAL HIGH (ref 70–99)
Potassium: 4.2 mEq/L (ref 3.5–5.1)
Sodium: 136 mEq/L (ref 135–145)

## 2012-08-21 LAB — CBC WITH DIFFERENTIAL/PLATELET
Basophils Absolute: 0 10*3/uL (ref 0.0–0.1)
Basophils Relative: 0.5 % (ref 0.0–3.0)
Eosinophils Absolute: 0.1 10*3/uL (ref 0.0–0.7)
Eosinophils Relative: 1.9 % (ref 0.0–5.0)
HCT: 34.7 % — ABNORMAL LOW (ref 39.0–52.0)
Hemoglobin: 11.4 g/dL — ABNORMAL LOW (ref 13.0–17.0)
Lymphocytes Relative: 24 % (ref 12.0–46.0)
Lymphs Abs: 1.6 10*3/uL (ref 0.7–4.0)
MCHC: 32.9 g/dL (ref 30.0–36.0)
MCV: 91.2 fl (ref 78.0–100.0)
Monocytes Absolute: 0.6 10*3/uL (ref 0.1–1.0)
Monocytes Relative: 9.6 % (ref 3.0–12.0)
Neutro Abs: 4.3 10*3/uL (ref 1.4–7.7)
Neutrophils Relative %: 64 % (ref 43.0–77.0)
Platelets: 196 10*3/uL (ref 150.0–400.0)
RBC: 3.8 Mil/uL — ABNORMAL LOW (ref 4.22–5.81)
RDW: 14.3 % (ref 11.5–14.6)
WBC: 6.7 10*3/uL (ref 4.5–10.5)

## 2012-08-21 LAB — PROTIME-INR
INR: 1 ratio (ref 0.8–1.0)
Prothrombin Time: 10.6 s (ref 10.2–12.4)

## 2012-08-21 LAB — APTT: aPTT: 28.3 s (ref 21.7–28.8)

## 2012-08-21 NOTE — Patient Instructions (Addendum)
We need to arrange for a heart catheterization  We will check labs today  You are scheduled for an outpatient cardiac catheterization on Tuesday, November 12th with Dr. Martinique or associates.  Go the [facility] at [facility] on Tuesday, November 12th at 9:30AM.  Call the [facility] at [phone] if you are unable to make your appointment.  The code to get into the parking garage under the building is 8000. Then go to the first floor.  You must have someone available to drive you home. Someone needs to be with you for the first 24 hours after you arrive home. Please wear clothes that are easy to get on and off.  Do not eat or drink after midnight on Monday. You may have water only with your medications on the morning of your procedure.   May sure you take your aspirin on the day of your procedure.   Take only half dose of insulin on Monday night and do not take any insulin Tuesday morning  Coronary Angiography Coronary angiography is an X-ray procedure used to look at the arteries in the heart. In this procedure, a dye is injected through a long, hollow tube (catheter). The catheter is about the size of a piece of cooked spaghetti. The catheter injects a dye into an artery in your groin. X-rays are then taken to show if there is a blockage in the arteries of your heart. BEFORE THE PROCEDURE   Let your caregiver know if you have allergies to shellfish or contrast dye. Also let your caregiver know if you have kidney problems or failure.  Do not eat or drink starting from midnight up to the time of the procedure, or as directed.  You may drink enough water to take your medications the morning of the procedure if you were instructed to do so.  You should be at the hospital or outpatient facility where the procedure is to be done 60 minutes prior to the procedure or as directed. PROCEDURE  You may be given an IV medication to help you relax before the  procedure.  You will be prepared for the procedure by washing and shaving the area where the catheter will be inserted. This is usually done in the groin but may be done in the fold of your arm by your elbow.  A medicine will be given to numb your groin where the catheter will be inserted.  A specially trained doctor will insert the catheter into an artery in your groin. The catheter is guided by using a special type of X-ray (fluoroscopy) to the blood vessel being examined.  A special dye is then injected into the catheter and X-rays are taken. The dye helps to show where any narrowing or blockages are located in the heart arteries. AFTER THE PROCEDURE   After the procedure you will be kept in bed lying flat for several hours. You will be instructed to not bend or cross your legs.  The groin insertion site will be watched and checked frequently.  The pulse in your feet will be checked frequently.  Additional blood tests, X-rays and an EKG may be done.  You may stay in the hospital overnight for observation. SEEK IMMEDIATE MEDICAL CARE IF:   You develop chest pain, shortness of breath, feel faint, or pass out.  There is bleeding, swelling, or drainage from the catheter insertion site.  You develop pain, discoloration, coldness, or severe bruising in the leg or area where  the catheter was inserted.  You have a fever. Document Released: 04/07/2003 Document Revised: 12/24/2011 Document Reviewed: 05/26/2008 [facility] Patient Information 2013 [place].  Directions to the [facility]patient [facility]:  Please Note:  Park in [place] under the building not the parking deck.  From [employer]: Turn onto [address] Left onto [address] (1st stoplight) Right at the brick entrance to the hospital ([address]) [place] to the right and you will see a blue sign "Heart [facility] Vascular Center" Parking garage is a sharp right, to get through the gate put in the code  8000. Once you park, take the elevator to the first floor.  Please do not arrive before 6:30 am.  The building will be dark before that time.  From [employer]: Turn onto [address] left into the brick entrance to the hospital ([address] circle [address]) [place] to the right and you will see a blue sign "[facility]" Parking garage is a sharp right, to get through the gate put in the code 8000. Once you park, take the elevator to the first floor.  Please do not arrive before 6:30 am.  The building will be dark before that time.

## 2012-08-21 NOTE — Progress Notes (Signed)
Ernest [name] Date of Birth: January 11, 1946 Medical Record [id number]  History of Present Illness: Ernest [name] is seen back today for a follow up visit. He is seen for [name]. He has multiple medical issues which included DM, presumed CAD with no prior cath, on dialysis for the past year. Followed by [name]. Had negative Myoview in 2010. He has had recent abnormal Myoview and cardiac cath has been recommended. He is undergoing work up for renal transplant.   He comes in today. He is here with his daughter. He is doing ok. No chest pain. Some occasional DOE but overall ok. Not dizzy or lightheaded. No syncope. Sugars are labile. Tolerating dialysis well without issue. He has dialysis on M-W-F.  Current Outpatient Prescriptions on File Prior to Visit  Medication Sig Dispense Refill  . allopurinol (ZYLOPRIM) 100 MG tablet Take 100 mg by mouth daily.        [name] amitriptyline (ELAVIL) 25 MG tablet Take 25 mg by mouth at bedtime.      [name] aspirin 325 MG tablet Take 325 mg by mouth daily.        . Calcium Acetate 667 MG TABS Take 1 tablet by mouth daily.       . clopidogrel (PLAVIX) 75 MG tablet Take 75 mg by mouth daily.        . colchicine 0.6 MG tablet Take 0.6 mg by mouth daily.        . insulin NPH (HUMULIN N,NOVOLIN N) 100 UNIT/ML injection Inject 30-50 Units into the skin 2 (two) times daily. Takes 50 units in the morning and 30 units at night      . insulin regular (NOVOLIN R,HUMULIN R) 100 units/mL injection Inject 30-40 Units into the skin 3 (three) times daily before meals. Takes 40 units in the morning; Takes 30 units midday; Takes 40 units at night      . multivitamin (RENA-VIT) TABS tablet Take 1 tablet by mouth daily.        . sevelamer (RENVELA) 800 MG tablet Take 1,600 mg by mouth 3 (three) times daily with meals.      . simvastatin (ZOCOR) 20 MG tablet Take 20 mg by mouth at bedtime.        . [DISCONTINUED] sevelamer (RENVELA) 800 MG tablet Take 800 mg by mouth 3 (three)  times daily with meals.        [name]  Past Medical History  Diagnosis Date  . Abnormal electrocardiogram   . Hyperlipidemia   . Retinopathy   . Gout   . ESRD (end stage renal disease)     on hemodialysis Mon, Wed, Fri - followed by [name]  . Type II or unspecified type diabetes mellitus without mention of complication, not stated as uncontrolled     adult onset  . Erectile dysfunction     penile implant  . Hepatitis C     Past Surgical History  Procedure Date  . Penile prosthesis implant   . Fistula     RUE and wrist    History  Smoking status  . Former Smoker  . Quit date: 06/10/1973  Smokeless tobacco  . Never Used    History  Alcohol Use No    Family History  Problem Relation Age of Onset  . Heart disease Father     Review of Systems: The review of systems is per the HPI.  All other systems were reviewed and are negative.  Physical Exam: BP 160/80  Pulse 84  Ht 5\' 9"  (1.753 m)  Wt 255 lb 12.8 oz (116.03 kg)  BMI 37.78 kg/m2 Patient is very pleasant and in no acute distress. He is obese. Skin is warm and dry. Color is normal.  HEENT is unremarkable. Normocephalic/atraumatic. PERRL. Sclera are nonicteric. Neck is supple. No masses. No JVD. Lungs are clear. Cardiac exam shows a regular rate and rhythm. Abdomen is obese but soft. Extremities are without edema. Fistula in the right upper arm. Gait and ROM are intact. No gross neurologic deficits noted.  LABORATORY DATA: PENDING  Lab Results  Component Value Date   WBC 10.7* 03/08/2012   HGB 9.1* 03/08/2012   HCT 27.3* 03/08/2012   PLT 273 03/08/2012   GLUCOSE 178* 03/08/2012   NA 133* 03/08/2012   K 3.9 03/08/2012   CL 94* 03/08/2012   CREATININE 8.67* 03/08/2012   BUN 40* 03/08/2012   CO2 26 03/08/2012   INR 1.02 10/14/2009   Myoview Overall Impression:   Small area of mid and apical inferior wall ischemia Diffuse hypokinesis worse in the apex EF 40%  LV Ejection Fraction: 40%. LV  Wall Motion: Diffuse hypokinesis worse in the apex   .[name]     Assessment / Plan: 1. Abnormal myoview - No current symptoms except some mild DOE. Has multiple CV risk factors. We will arrange for heart catheterization. The procedure, risks and benefits have been reviewed and he is willing to proceed. I have left him on his current medicines but based on cath findings, may needs medications adjusted in light of the reduced EF.   2. HTN  3. HLD  4. ESRD - on dialysis.  Will proceed with cath next Tuesday. Patient is agreeable to this plan and will call if any problems develop in the interim.

## 2012-08-26 ENCOUNTER — Inpatient Hospital Stay (HOSPITAL_BASED_OUTPATIENT_CLINIC_OR_DEPARTMENT_OTHER)
Admission: RE | Admit: 2012-08-26 | Discharge: 2012-08-26 | Disposition: A | Discharge status: Home / Self Care | Payer: Medicare Other | Source: Ambulatory Visit | Attending: Cardiology | Admitting: Cardiology

## 2012-08-26 ENCOUNTER — Encounter (HOSPITAL_BASED_OUTPATIENT_CLINIC_OR_DEPARTMENT_OTHER)
Admission: RE | Disposition: A | Discharge status: Home / Self Care | Payer: Self-pay | Source: Ambulatory Visit | Attending: Cardiology

## 2012-08-26 DIAGNOSIS — Z01810 Encounter for preprocedural cardiovascular examination: Secondary | ICD-10-CM

## 2012-08-26 DIAGNOSIS — R0609 Other forms of dyspnea: Secondary | ICD-10-CM | POA: Insufficient documentation

## 2012-08-26 DIAGNOSIS — N186 End stage renal disease: Secondary | ICD-10-CM | POA: Diagnosis not present

## 2012-08-26 DIAGNOSIS — R9439 Abnormal result of other cardiovascular function study: Secondary | ICD-10-CM | POA: Diagnosis not present

## 2012-08-26 DIAGNOSIS — I120 Hypertensive chronic kidney disease with stage 5 chronic kidney disease or end stage renal disease: Secondary | ICD-10-CM | POA: Insufficient documentation

## 2012-08-26 DIAGNOSIS — I2510 Atherosclerotic heart disease of native coronary artery without angina pectoris: Secondary | ICD-10-CM

## 2012-08-26 DIAGNOSIS — R0989 Other specified symptoms and signs involving the circulatory and respiratory systems: Secondary | ICD-10-CM | POA: Insufficient documentation

## 2012-08-26 DIAGNOSIS — E119 Type 2 diabetes mellitus without complications: Secondary | ICD-10-CM | POA: Insufficient documentation

## 2012-08-26 DIAGNOSIS — E785 Hyperlipidemia, unspecified: Secondary | ICD-10-CM | POA: Insufficient documentation

## 2012-08-26 DIAGNOSIS — M109 Gout, unspecified: Secondary | ICD-10-CM | POA: Insufficient documentation

## 2012-08-26 HISTORY — PX: CARDIAC CATHETERIZATION: SHX172

## 2012-08-26 LAB — POCT I-STAT GLUCOSE
Glucose, Bld: 168 mg/dL — ABNORMAL HIGH (ref 70–99)
Operator id: 328371

## 2012-08-26 SURGERY — JV LEFT HEART CATHETERIZATION WITH CORONARY ANGIOGRAM
Anesthesia: Moderate Sedation

## 2012-08-26 MED ORDER — SODIUM CHLORIDE 0.9 % IV SOLN: INTRAVENOUS | Status: DC

## 2012-08-26 MED ORDER — SODIUM CHLORIDE 0.9 % IV SOLN: 250.0000 mL | INTRAVENOUS | Status: DC | PRN

## 2012-08-26 MED ORDER — DIAZEPAM 5 MG PO TABS
5.0000 mg | ORAL_TABLET | ORAL | Status: AC
  Administered 2012-08-26: 5 mg via ORAL

## 2012-08-26 MED ORDER — SODIUM CHLORIDE 0.9 % IV SOLN: 1.0000 mL/kg/h | INTRAVENOUS | Status: DC

## 2012-08-26 MED ORDER — ONDANSETRON HCL 4 MG/2ML IJ SOLN: 4.0000 mg | Freq: Four times a day (QID) | INTRAMUSCULAR | Status: DC | PRN

## 2012-08-26 MED ORDER — SODIUM CHLORIDE 0.9 % IV SOLN
INTRAVENOUS | Status: DC
  Administered 2012-08-26: 10:00:00 via INTRAVENOUS

## 2012-08-26 MED ORDER — ACETAMINOPHEN 325 MG PO TABS: 650.0000 mg | ORAL_TABLET | ORAL | Status: DC | PRN

## 2012-08-26 MED ORDER — SODIUM CHLORIDE 0.9 % IJ SOLN: 3.0000 mL | Freq: Two times a day (BID) | INTRAMUSCULAR | Status: DC

## 2012-08-26 MED ORDER — SODIUM CHLORIDE 0.9 % IJ SOLN: 3.0000 mL | INTRAMUSCULAR | Status: DC | PRN

## 2012-08-26 MED ORDER — ASPIRIN 81 MG PO CHEW
324.0000 mg | CHEWABLE_TABLET | ORAL | Status: AC
  Administered 2012-08-26: 324 mg via ORAL

## 2012-08-26 NOTE — CV Procedure (Signed)
   Cardiac Catheterization Procedure Note  Name: Ernest [name] MRN: [id number] DOB: 03/13/1946  Procedure: Left Heart Cath, Selective Coronary Angiography, LV angiography  Indication: 66 year old black male with history of end-stage renal disease and multiple cardiac risk factors. He was recently evaluated for possible renal transplant. A Myoview study demonstrated evidence of inferior wall ischemia and reduced ejection fraction of 40%.   Procedural details: The right groin was prepped, draped, and anesthetized with 1% lidocaine. Using modified Seldinger technique, a 4 French sheath was introduced into the right femoral artery. Standard Judkins catheters were used for coronary angiography and left ventriculography. Catheter exchanges were performed over a guidewire. There were no immediate procedural complications. The patient was transferred to the post catheterization recovery area for further monitoring.  Procedural Findings: Hemodynamics:  AO 170/80 with a mean of 116 mmHg LV 170/30 mmHg   Coronary angiography: Coronary dominance: right  Left mainstem: There is a 50% eccentric ostial left main stenosis.  Left anterior descending (LAD): The LAD is heavily calcified throughout the proximal vessel. There is 50-70% stenosis in the proximal vessel followed by segment of 40% disease in the proximal to mid vessel.  The ramus intermediate branch is moderate in size and has mild disease at the ostium up to 30%.  Left circumflex (LCx): The left circumflex has minor irregularities up to 20%.  Right coronary artery (RCA): The right coronary has a 50% stenosis in the proximal vessel. The remainder of the vessel is without significant disease.  Left ventriculography: There is apical hypokinesis with mild left ventricular dysfunction. Ejection fraction is estimated at 50%.  Final Conclusions:   1. There is modest disease involving the ostial left main, proximal LAD and proximal right  coronary. These lesions do not appear to be hemodynamically significant. 2. Mild left ventricular dysfunction with apical hypokinesis.  Recommendations: Cardiac catheterization findings do not correlate well with his Myoview study. His ejection fraction looks better angiographically than 40%. Patient has been asymptomatic. I would recommend medical management.  Collier Salina [facility] 08/26/2012, 11:14 AM

## 2012-08-26 NOTE — OR Nursing (Signed)
Discharge instructions reviewed and signed, pt stated understanding, ambulated in hall without difficulty, site level 0, transported to daughter's car via wheelchair 

## 2012-08-26 NOTE — H&P (View-Only) (Signed)
Ernest [name] Date of Birth: 10-29-1945 Medical Record [id number]  History of Present Illness: Ernest [name] is seen back today for a follow up visit. He is seen for [name]. He has multiple medical issues which included DM, presumed CAD with no prior cath, on dialysis for the past year. Followed by [name]. Had negative Myoview in 2010. He has had recent abnormal Myoview and cardiac cath has been recommended. He is undergoing work up for renal transplant.   He comes in today. He is here with his daughter. He is doing ok. No chest pain. Some occasional DOE but overall ok. Not dizzy or lightheaded. No syncope. Sugars are labile. Tolerating dialysis well without issue. He has dialysis on M-W-F.  Current Outpatient Prescriptions on File Prior to Visit  Medication Sig Dispense Refill  . allopurinol (ZYLOPRIM) 100 MG tablet Take 100 mg by mouth daily.        [name] amitriptyline (ELAVIL) 25 MG tablet Take 25 mg by mouth at bedtime.      [name] aspirin 325 MG tablet Take 325 mg by mouth daily.        . Calcium Acetate 667 MG TABS Take 1 tablet by mouth daily.       . clopidogrel (PLAVIX) 75 MG tablet Take 75 mg by mouth daily.        . colchicine 0.6 MG tablet Take 0.6 mg by mouth daily.        . insulin NPH (HUMULIN N,NOVOLIN N) 100 UNIT/ML injection Inject 30-50 Units into the skin 2 (two) times daily. Takes 50 units in the morning and 30 units at night      . insulin regular (NOVOLIN R,HUMULIN R) 100 units/mL injection Inject 30-40 Units into the skin 3 (three) times daily before meals. Takes 40 units in the morning; Takes 30 units midday; Takes 40 units at night      . multivitamin (RENA-VIT) TABS tablet Take 1 tablet by mouth daily.        . sevelamer (RENVELA) 800 MG tablet Take 1,600 mg by mouth 3 (three) times daily with meals.      . simvastatin (ZOCOR) 20 MG tablet Take 20 mg by mouth at bedtime.        . [DISCONTINUED] sevelamer (RENVELA) 800 MG tablet Take 800 mg by mouth 3 (three)  times daily with meals.        [name]  Past Medical History  Diagnosis Date  . Abnormal electrocardiogram   . Hyperlipidemia   . Retinopathy   . Gout   . ESRD (end stage renal disease)     on hemodialysis Mon, Wed, Fri - followed by [name]  . Type II or unspecified type diabetes mellitus without mention of complication, not stated as uncontrolled     adult onset  . Erectile dysfunction     penile implant  . Hepatitis C     Past Surgical History  Procedure Date  . Penile prosthesis implant   . Fistula     RUE and wrist    History  Smoking status  . Former Smoker  . Quit date: 06/10/1973  Smokeless tobacco  . Never Used    History  Alcohol Use No    Family History  Problem Relation Age of Onset  . Heart disease Father     Review of Systems: The review of systems is per the HPI.  All other systems were reviewed and are negative.  Physical Exam: BP 160/80  Pulse 84  Ht 5\' 9"  (1.753 m)  Wt 255 lb 12.8 oz (116.03 kg)  BMI 37.78 kg/m2 Patient is very pleasant and in no acute distress. He is obese. Skin is warm and dry. Color is normal.  HEENT is unremarkable. Normocephalic/atraumatic. PERRL. Sclera are nonicteric. Neck is supple. No masses. No JVD. Lungs are clear. Cardiac exam shows a regular rate and rhythm. Abdomen is obese but soft. Extremities are without edema. Fistula in the right upper arm. Gait and ROM are intact. No gross neurologic deficits noted.  LABORATORY DATA: PENDING  Lab Results  Component Value Date   WBC 10.7* 03/08/2012   HGB 9.1* 03/08/2012   HCT 27.3* 03/08/2012   PLT 273 03/08/2012   GLUCOSE 178* 03/08/2012   NA 133* 03/08/2012   K 3.9 03/08/2012   CL 94* 03/08/2012   CREATININE 8.67* 03/08/2012   BUN 40* 03/08/2012   CO2 26 03/08/2012   INR 1.02 10/14/2009   Myoview Overall Impression:   Small area of mid and apical inferior wall ischemia Diffuse hypokinesis worse in the apex EF 40%  LV Ejection Fraction: 40%. LV  Wall Motion: Diffuse hypokinesis worse in the apex   .[name]     Assessment / Plan: 1. Abnormal myoview - No current symptoms except some mild DOE. Has multiple CV risk factors. We will arrange for heart catheterization. The procedure, risks and benefits have been reviewed and he is willing to proceed. I have left him on his current medicines but based on cath findings, may needs medications adjusted in light of the reduced EF.   2. HTN  3. HLD  4. ESRD - on dialysis.  Will proceed with cath next Tuesday. Patient is agreeable to this plan and will call if any problems develop in the interim.

## 2012-08-26 NOTE — OR Nursing (Signed)
Tegaderm dressing applied, site level 0, bedrest begins at 1125 

## 2012-08-26 NOTE — Interval H&P Note (Signed)
History and Physical Interval Note:  08/26/2012 10:46 AM  Ernest [name]  has presented today for surgery, with the diagnosis of chest pain  The various methods of treatment have been discussed with the patient and family. After consideration of risks, benefits and other options for treatment, the patient has consented to  Procedure(s) (LRB) with comments: JV LEFT HEART CATHETERIZATION WITH CORONARY ANGIOGRAM (N/A) as a surgical intervention .  The patient's history has been reviewed, patient examined, no change in status, stable for surgery.  I have reviewed the patient's chart and labs.  Questions were answered to the patient's satisfaction.     Collier Salina [facility] 08/26/2012 10:46 AM

## 2012-09-04 DIAGNOSIS — I10 Essential (primary) hypertension: Secondary | ICD-10-CM | POA: Diagnosis not present

## 2012-09-04 DIAGNOSIS — E7800 Pure hypercholesterolemia, unspecified: Secondary | ICD-10-CM | POA: Diagnosis not present

## 2012-09-04 DIAGNOSIS — N186 End stage renal disease: Secondary | ICD-10-CM | POA: Diagnosis not present

## 2012-09-04 DIAGNOSIS — E119 Type 2 diabetes mellitus without complications: Secondary | ICD-10-CM | POA: Diagnosis not present

## 2012-09-09 ENCOUNTER — Encounter: Payer: Self-pay | Admitting: Nurse Practitioner

## 2012-09-09 ENCOUNTER — Ambulatory Visit (INDEPENDENT_AMBULATORY_CARE_PROVIDER_SITE_OTHER): Payer: Medicare Other | Admitting: Nurse Practitioner

## 2012-09-09 VITALS — BP 148/60 | HR 68 | Ht 69.0 in | Wt 259.1 lb

## 2012-09-09 DIAGNOSIS — I2510 Atherosclerotic heart disease of native coronary artery without angina pectoris: Secondary | ICD-10-CM

## 2012-09-09 DIAGNOSIS — Z9889 Other specified postprocedural states: Secondary | ICD-10-CM

## 2012-09-09 NOTE — Patient Instructions (Addendum)
Keep walking - goal is to work up to about 45 minutes a day  Try to get some blood pressure readings on the days you are not at dialysis  Here are my tips to lose weight:  1. Drink only water. You do not need milk, juice, tea, soda or diet soda.  2. Do not eat anything "white". This includes white bread, potatoes, rice or mayo  3. Stay away from fried foods and sweets  4. Your portion should be the size of the palm of your hand.  5. Know what your weaknesses are and avoid.  6. Find an exercise you like and do it every day for 45 to 60 minutes.       See [name] in March. The office should be sending you a letter when to schedule  Call the [facility] office at [phone] if you have any questions, problems or concerns.

## 2012-09-09 NOTE — Progress Notes (Signed)
Ernest [name] Date of Birth: [birth year] Medical Record [id number]  History of Present Illness: Ernest [name] is seen back today for a post cath visit. He is seen for [name]. He has multiple medical issues which include DM, CAD with recent cath and ESRD - on dialysis. He is followed [name]. Had a recent abnormal Myoview as part of a work up for a renal transplant.  He has had recent cath with results noted below. Will be managed medically.   He comes in today. He is here alone. He is doing ok. Says he is feeling ok. No chest pain. Trying to walk some. Says that the doctors at [facility] has put his transplant work up on hold until he can lose some weight. BP runs low on his dialysis days. He says his lipids are checked by nephrology as well. No problems with his groin.   Current Outpatient Prescriptions on File Prior to Visit  Medication Sig Dispense Refill  . allopurinol (ZYLOPRIM) 100 MG tablet Take 100 mg by mouth daily.        [name] amitriptyline (ELAVIL) 25 MG tablet Take 25 mg by mouth at bedtime.      [name] aspirin 325 MG tablet Take 325 mg by mouth daily.        . Calcium Acetate 667 MG TABS Take 1 tablet by mouth daily.       . clopidogrel (PLAVIX) 75 MG tablet Take 75 mg by mouth daily.        . colchicine 0.6 MG tablet Take 0.6 mg by mouth daily.        . insulin NPH (HUMULIN N,NOVOLIN N) 100 UNIT/ML injection Inject 30-50 Units into the skin 2 (two) times daily. Takes 50 units in the morning and 30 units at night      . insulin regular (NOVOLIN R,HUMULIN R) 100 units/mL injection Inject 30-40 Units into the skin 3 (three) times daily before meals. Takes 40 units in the morning; Takes 30 units midday; Takes 40 units at night      . multivitamin (RENA-VIT) TABS tablet Take 1 tablet by mouth daily.        . sevelamer (RENVELA) 800 MG tablet Take 1,600 mg by mouth 3 (three) times daily with meals.      . simvastatin (ZOCOR) 20 MG tablet Take 20 mg by mouth at bedtime.          No  Known Allergies  Past Medical History  Diagnosis Date  . Abnormal electrocardiogram   . Hyperlipidemia   . Retinopathy   . Gout   . ESRD (end stage renal disease)     on hemodialysis Mon, Wed, Fri - followed by [name]  . Type II or unspecified type diabetes mellitus without mention of complication, not stated as uncontrolled     adult onset  . Erectile dysfunction     penile implant  . Hepatitis C   . Abnormal stress test     s/p cath November 2013 with modest disease involving the ostial left main, proximal LAD, proximal RCA - do not appear to be hemodynamically signficant; mild LV dysfunction  . Obesity     Past Surgical History  Procedure Date  . Penile prosthesis implant   . Fistula     RUE and wrist  . Cardiac catheterization August 26, 2012    History  Smoking status  . Former Smoker  . Quit date: 06/10/1973  Smokeless tobacco  . Never Used  History  Alcohol Use No    Family History  Problem Relation Age of Onset  . Heart disease Father     Review of Systems: The review of systems is per the HPI.  All other systems were reviewed and are negative.  Physical Exam: BP 148/60  Pulse 68  Ht 5\' 9"  (1.753 m)  Wt 259 lb 1.9 oz (117.536 kg)  BMI 38.27 kg/m2 Patient is very pleasant and in no acute distress. He is obese. Skin is warm and dry. Color is normal.  HEENT is unremarkable. Normocephalic/atraumatic. PERRL. Sclera are nonicteric. Neck is supple. No masses. No JVD. Lungs are clear. Cardiac exam shows a regular rate and rhythm. Abdomen is obese but soft. Extremities are without edema. Gait and ROM are intact. No gross neurologic deficits noted.  LABORATORY DATA:  Lab Results  Component Value Date   WBC 6.7 08/21/2012   HGB 11.4* 08/21/2012   HCT 34.7* 08/21/2012   PLT 196.0 08/21/2012   GLUCOSE 168* 08/26/2012   NA 136 08/21/2012   K 4.2 08/21/2012   CL 97 08/21/2012   CREATININE 8.2* 08/21/2012   BUN 39* 08/21/2012   CO2 27 08/21/2012   INR  1.0 08/21/2012    No results found for this basename: CHOL,  HDL,  LDLCALC,  LDLDIRECT,  TRIG,  CHOLHDL    Cardiac Cath Conclusions:  Coronary dominance: right   Left mainstem: There is a 50% eccentric ostial left main stenosis.  Left anterior descending (LAD): The LAD is heavily calcified throughout the proximal vessel. There is 50-70% stenosis in the proximal vessel followed by segment of 40% disease in the proximal to mid vessel.  The ramus intermediate branch is moderate in size and has mild disease at the ostium up to 30%.  Left circumflex (LCx): The left circumflex has minor irregularities up to 20%.  Right coronary artery (RCA): The right coronary has a 50% stenosis in the proximal vessel. The remainder of the vessel is without significant disease.  Left ventriculography: There is apical hypokinesis with mild left ventricular dysfunction. Ejection fraction is estimated at 50%.   Final Conclusions:   1. There is modest disease involving the ostial left main, proximal LAD and proximal right coronary. These lesions do not appear to be hemodynamically significant.  2. Mild left ventricular dysfunction with apical hypokinesis.   Recommendations: Cardiac catheterization findings do not correlate well with his Myoview study. His ejection fraction looks better angiographically than 40%. Patient has been asymptomatic. I would recommend medical management.   Collier [facility]  08/26/2012, 11:14 AM    Assessment / Plan:  1. Abnormal Myoview with subsequent cath - will manage medically. EF is 50% per cath. He is on statin therapy. No active chest pain.   2. Elevated BP - is improved when rechecked by me. BP runs low on his dialysis days.   3. Obesity - we discussed ways to lose weight. He says he is trying to walk some. I encouraged him to work towards a goal of 45 minutes a day.  He will see [name] in March.   Patient is agreeable to this plan and will call if any problems  develop in the interim.

## 2012-09-13 DIAGNOSIS — N186 End stage renal disease: Secondary | ICD-10-CM | POA: Diagnosis not present

## 2012-09-15 DIAGNOSIS — N2581 Secondary hyperparathyroidism of renal origin: Secondary | ICD-10-CM | POA: Diagnosis not present

## 2012-09-15 DIAGNOSIS — N186 End stage renal disease: Secondary | ICD-10-CM | POA: Diagnosis not present

## 2012-09-15 DIAGNOSIS — D509 Iron deficiency anemia, unspecified: Secondary | ICD-10-CM | POA: Diagnosis not present

## 2012-10-09 ENCOUNTER — Other Ambulatory Visit (HOSPITAL_COMMUNITY): Payer: Self-pay | Admitting: Nephrology

## 2012-10-09 DIAGNOSIS — N186 End stage renal disease: Secondary | ICD-10-CM

## 2012-10-14 ENCOUNTER — Other Ambulatory Visit (HOSPITAL_COMMUNITY): Payer: Self-pay | Admitting: Nephrology

## 2012-10-14 ENCOUNTER — Ambulatory Visit (HOSPITAL_COMMUNITY)
Admission: RE | Admit: 2012-10-14 | Discharge: 2012-10-14 | Disposition: A | Discharge status: Home / Self Care | Payer: Medicare Other | Source: Ambulatory Visit | Attending: Nephrology | Admitting: Nephrology

## 2012-10-14 DIAGNOSIS — N186 End stage renal disease: Secondary | ICD-10-CM | POA: Insufficient documentation

## 2012-10-14 DIAGNOSIS — I871 Compression of vein: Secondary | ICD-10-CM | POA: Diagnosis not present

## 2012-10-14 DIAGNOSIS — Y831 Surgical operation with implant of artificial internal device as the cause of abnormal reaction of the patient, or of later complication, without mention of misadventure at the time of the procedure: Secondary | ICD-10-CM | POA: Insufficient documentation

## 2012-10-14 DIAGNOSIS — Y832 Surgical operation with anastomosis, bypass or graft as the cause of abnormal reaction of the patient, or of later complication, without mention of misadventure at the time of the procedure: Secondary | ICD-10-CM | POA: Insufficient documentation

## 2012-10-14 DIAGNOSIS — E119 Type 2 diabetes mellitus without complications: Secondary | ICD-10-CM | POA: Insufficient documentation

## 2012-10-14 DIAGNOSIS — Z992 Dependence on renal dialysis: Secondary | ICD-10-CM | POA: Diagnosis not present

## 2012-10-14 DIAGNOSIS — T82898A Other specified complication of vascular prosthetic devices, implants and grafts, initial encounter: Secondary | ICD-10-CM | POA: Diagnosis not present

## 2012-10-14 MED ORDER — IOHEXOL 300 MG/ML  SOLN
100.0000 mL | Freq: Once | INTRAMUSCULAR | Status: AC | PRN
  Administered 2012-10-14: 60 mL via INTRAVENOUS

## 2012-10-14 NOTE — H&P (Signed)
HPI: Ernest [name] is an 66 y.o. male with ESRD and a right UE AVF with recurrent stenosis shown on fistulogram. He has had intervention before on this vein with success. He is familiar with procedure. PMHx and meds reviewed. No recent illness aside from mild cough.  Past Medical History:  Past Medical History  Diagnosis Date  . Abnormal electrocardiogram   . Hyperlipidemia   . Retinopathy   . Gout   . ESRD (end stage renal disease)     on hemodialysis Mon, Wed, Fri - followed by [name]  . Type II or unspecified type diabetes mellitus without mention of complication, not stated as uncontrolled     adult onset  . Erectile dysfunction     penile implant  . Hepatitis C   . Abnormal stress test     s/p cath November 2013 with modest disease involving the ostial left main, proximal LAD, proximal RCA - do not appear to be hemodynamically signficant; mild LV dysfunction  . Obesity     Past Surgical History:  Past Surgical History  Procedure Date  . Penile prosthesis implant   . Fistula     RUE and wrist  . Cardiac catheterization August 26, 2012    Family History:  Family History  Problem Relation Age of Onset  . Heart disease Father     Social History:  reports that he quit smoking about 39 years ago. He has never used smokeless tobacco. He reports that he does not drink alcohol or use illicit drugs.  Allergies: [name]  Medications: Current Outpatient Prescriptions on File Prior to Visit   Medication  Sig  Dispense  Refill   .  allopurinol (ZYLOPRIM) 100 MG tablet  Take 100 mg by mouth daily.     [name]  amitriptyline (ELAVIL) 25 MG tablet  Take 25 mg by mouth at bedtime.     [name]  aspirin 325 MG tablet  Take 325 mg by mouth daily.     .  Calcium Acetate 667 MG TABS  Take 1 tablet by mouth daily.     .  clopidogrel (PLAVIX) 75 MG tablet  Take 75 mg by mouth daily.     .  colchicine 0.6 MG tablet  Take 0.6 mg by mouth daily.     .  insulin NPH (HUMULIN  N,NOVOLIN N) 100 UNIT/ML injection  Inject 30-50 Units into the skin 2 (two) times daily. Takes 50 units in the morning and 30 units at night     .  insulin regular (NOVOLIN R,HUMULIN R) 100 units/mL injection  Inject 30-40 Units into the skin 3 (three) times daily before meals. Takes 40 units in the morning; Takes 30 units midday; Takes 40 units at night     .  multivitamin (RENA-VIT) TABS tablet  Take 1 tablet by mouth daily.     .  sevelamer (RENVELA) 800 MG tablet  Take 1,600 mg by mouth 3 (three) times daily with meals.     .  simvastatin (ZOCOR) 20 MG tablet  Take 20 mg by mouth at bedtime.        Please HPI for pertinent positives, otherwise complete 10 system ROS negative.  Physical Exam: There were no vitals taken for this visit. There is no height or weight on file to calculate BMI.   General Appearance:  Alert, cooperative, no distress, appears stated age  Head:  Normocephalic, without obvious abnormality, atraumatic  ENT: Unremarkable  Neck: Supple, symmetrical, trachea midline,  no adenopathy, thyroid: not enlarged, symmetric, no tenderness/mass/nodules  Lungs:   Clear to auscultation bilaterally, no w/r/r, respirations unlabored without use of accessory muscles.  Chest Wall:  No tenderness or deformity  Heart:  Regular rate and rhythm, S1, S2 normal, no murmur, rub or gallop. Carotids 2+ without bruit.  Extremities: Rt UE AVF palpable, hand warm, good radial pulse, no edema  Pulses: 2+ and symmetric  Neurologic: Normal affect, no gross deficits.   No results found for this or any previous visit (from the past 48 hour(s)). No results found.  Assessment/Plan ESRD Recurrent stenosis of Rt UE AVF Plan for angioplasty possible stent placement today. Procedure discussed in detail. Consent signed in chart  Ascencion Dike [name] 10/14/2012, 8:19 AM

## 2012-10-14 NOTE — H&P (Signed)
Agree 

## 2012-10-14 NOTE — Procedures (Signed)
Procedure:  Right AV fistulogram and cephalic vein angioplasty Findings:  Recurrent and progressive long segment stenosis of cephalic v treated with 6 mm and 7 mm angioplasty with improved result.

## 2012-10-17 DIAGNOSIS — D631 Anemia in chronic kidney disease: Secondary | ICD-10-CM | POA: Diagnosis not present

## 2012-10-17 DIAGNOSIS — D509 Iron deficiency anemia, unspecified: Secondary | ICD-10-CM | POA: Diagnosis not present

## 2012-10-17 DIAGNOSIS — Z992 Dependence on renal dialysis: Secondary | ICD-10-CM | POA: Diagnosis not present

## 2012-10-17 DIAGNOSIS — N2581 Secondary hyperparathyroidism of renal origin: Secondary | ICD-10-CM | POA: Diagnosis not present

## 2012-10-17 DIAGNOSIS — N186 End stage renal disease: Secondary | ICD-10-CM | POA: Diagnosis not present

## 2012-11-05 DIAGNOSIS — E1129 Type 2 diabetes mellitus with other diabetic kidney complication: Secondary | ICD-10-CM | POA: Diagnosis not present

## 2012-11-06 DIAGNOSIS — B351 Tinea unguium: Secondary | ICD-10-CM | POA: Diagnosis not present

## 2012-11-06 DIAGNOSIS — M79609 Pain in unspecified limb: Secondary | ICD-10-CM | POA: Diagnosis not present

## 2012-11-11 DIAGNOSIS — G5600 Carpal tunnel syndrome, unspecified upper limb: Secondary | ICD-10-CM | POA: Diagnosis not present

## 2012-11-11 DIAGNOSIS — G5620 Lesion of ulnar nerve, unspecified upper limb: Secondary | ICD-10-CM | POA: Diagnosis not present

## 2012-11-11 DIAGNOSIS — E1149 Type 2 diabetes mellitus with other diabetic neurological complication: Secondary | ICD-10-CM | POA: Diagnosis not present

## 2012-11-11 DIAGNOSIS — E1142 Type 2 diabetes mellitus with diabetic polyneuropathy: Secondary | ICD-10-CM | POA: Diagnosis not present

## 2012-11-14 DIAGNOSIS — N186 End stage renal disease: Secondary | ICD-10-CM | POA: Diagnosis not present

## 2012-11-17 DIAGNOSIS — N186 End stage renal disease: Secondary | ICD-10-CM | POA: Diagnosis not present

## 2012-11-17 DIAGNOSIS — N2581 Secondary hyperparathyroidism of renal origin: Secondary | ICD-10-CM | POA: Diagnosis not present

## 2012-11-17 DIAGNOSIS — D509 Iron deficiency anemia, unspecified: Secondary | ICD-10-CM | POA: Diagnosis not present

## 2012-12-02 DIAGNOSIS — H35359 Cystoid macular degeneration, unspecified eye: Secondary | ICD-10-CM | POA: Diagnosis not present

## 2012-12-02 DIAGNOSIS — E1139 Type 2 diabetes mellitus with other diabetic ophthalmic complication: Secondary | ICD-10-CM | POA: Diagnosis not present

## 2012-12-02 DIAGNOSIS — E11359 Type 2 diabetes mellitus with proliferative diabetic retinopathy without macular edema: Secondary | ICD-10-CM | POA: Diagnosis not present

## 2012-12-12 DIAGNOSIS — N186 End stage renal disease: Secondary | ICD-10-CM | POA: Diagnosis not present

## 2012-12-15 DIAGNOSIS — D509 Iron deficiency anemia, unspecified: Secondary | ICD-10-CM | POA: Diagnosis not present

## 2012-12-15 DIAGNOSIS — N186 End stage renal disease: Secondary | ICD-10-CM | POA: Diagnosis not present

## 2012-12-15 DIAGNOSIS — N2581 Secondary hyperparathyroidism of renal origin: Secondary | ICD-10-CM | POA: Diagnosis not present

## 2012-12-23 DIAGNOSIS — G5600 Carpal tunnel syndrome, unspecified upper limb: Secondary | ICD-10-CM | POA: Diagnosis not present

## 2012-12-31 ENCOUNTER — Encounter (HOSPITAL_COMMUNITY): Payer: Self-pay | Admitting: *Deleted

## 2012-12-31 ENCOUNTER — Other Ambulatory Visit (HOSPITAL_COMMUNITY): Payer: Self-pay | Admitting: Nephrology

## 2012-12-31 ENCOUNTER — Other Ambulatory Visit: Payer: Self-pay | Admitting: *Deleted

## 2012-12-31 DIAGNOSIS — N186 End stage renal disease: Secondary | ICD-10-CM

## 2012-12-31 MED ORDER — DEXTROSE 5 % IV SOLN
1.5000 g | INTRAVENOUS | Status: AC
  Administered 2013-01-01: 1.5 g via INTRAVENOUS
  Filled 2012-12-31: qty 1.5

## 2012-12-31 NOTE — Progress Notes (Signed)
12/31/12 1325  OBSTRUCTIVE SLEEP APNEA  Have you ever been diagnosed with sleep apnea through a sleep study? No  Do you snore loudly (loud enough to be heard through closed doors)?  0  Do you often feel tired, fatigued, or sleepy during the daytime? 0  Has anyone observed you stop breathing during your sleep? 0  Do you have, or are you being treated for high blood pressure? 1  BMI more than 35 kg/m2? 1  Age over 65 years old? 1  Neck circumference greater than 40 cm/18 inches? 1  Gender: 1  Obstructive Sleep Apnea Score 5  Score 4 or greater  Results sent to PCP

## 2013-01-01 ENCOUNTER — Ambulatory Visit (HOSPITAL_COMMUNITY): Payer: Medicare Other | Admitting: Anesthesiology

## 2013-01-01 ENCOUNTER — Encounter (HOSPITAL_COMMUNITY): Payer: Self-pay | Admitting: *Deleted

## 2013-01-01 ENCOUNTER — Encounter (HOSPITAL_COMMUNITY): Payer: Self-pay | Admitting: Anesthesiology

## 2013-01-01 ENCOUNTER — Ambulatory Visit (HOSPITAL_COMMUNITY): Payer: Medicare Other

## 2013-01-01 ENCOUNTER — Other Ambulatory Visit: Payer: Self-pay | Admitting: *Deleted

## 2013-01-01 ENCOUNTER — Encounter (HOSPITAL_COMMUNITY)
Admission: RE | Disposition: A | Discharge status: Home / Self Care | Payer: Self-pay | Source: Ambulatory Visit | Attending: Surgery

## 2013-01-01 ENCOUNTER — Telehealth: Payer: Self-pay | Admitting: Vascular Surgery

## 2013-01-01 ENCOUNTER — Ambulatory Visit (HOSPITAL_COMMUNITY)
Admission: RE | Admit: 2013-01-01 | Discharge: 2013-01-01 | Disposition: A | Discharge status: Home / Self Care | Payer: Medicare Other | Source: Ambulatory Visit | Attending: Surgery | Admitting: Surgery

## 2013-01-01 DIAGNOSIS — I120 Hypertensive chronic kidney disease with stage 5 chronic kidney disease or end stage renal disease: Secondary | ICD-10-CM | POA: Diagnosis not present

## 2013-01-01 DIAGNOSIS — N186 End stage renal disease: Secondary | ICD-10-CM | POA: Diagnosis not present

## 2013-01-01 DIAGNOSIS — T82898A Other specified complication of vascular prosthetic devices, implants and grafts, initial encounter: Secondary | ICD-10-CM | POA: Diagnosis not present

## 2013-01-01 DIAGNOSIS — M109 Gout, unspecified: Secondary | ICD-10-CM | POA: Diagnosis not present

## 2013-01-01 DIAGNOSIS — B1920 Unspecified viral hepatitis C without hepatic coma: Secondary | ICD-10-CM | POA: Insufficient documentation

## 2013-01-01 DIAGNOSIS — Z794 Long term (current) use of insulin: Secondary | ICD-10-CM | POA: Diagnosis not present

## 2013-01-01 DIAGNOSIS — Q272 Other congenital malformations of renal artery: Secondary | ICD-10-CM

## 2013-01-01 DIAGNOSIS — Z452 Encounter for adjustment and management of vascular access device: Secondary | ICD-10-CM | POA: Diagnosis not present

## 2013-01-01 DIAGNOSIS — Z992 Dependence on renal dialysis: Secondary | ICD-10-CM | POA: Insufficient documentation

## 2013-01-01 DIAGNOSIS — E119 Type 2 diabetes mellitus without complications: Secondary | ICD-10-CM | POA: Insufficient documentation

## 2013-01-01 HISTORY — PX: INSERTION OF DIALYSIS CATHETER: SHX1324

## 2013-01-01 HISTORY — DX: Shortness of breath: R06.02

## 2013-01-01 LAB — POCT I-STAT 4, (NA,K, GLUC, HGB,HCT)
HCT: 34 % — ABNORMAL LOW (ref 39.0–52.0)
Sodium: 138 mEq/L (ref 135–145)

## 2013-01-01 LAB — GLUCOSE, CAPILLARY
Glucose-Capillary: 156 mg/dL — ABNORMAL HIGH (ref 70–99)
Glucose-Capillary: 151 mg/dL — ABNORMAL HIGH (ref 70–99)

## 2013-01-01 LAB — SURGICAL PCR SCREEN
MRSA, PCR: NEGATIVE
Staphylococcus aureus: NEGATIVE

## 2013-01-01 SURGERY — INSERTION OF DIALYSIS CATHETER
Anesthesia: Monitor Anesthesia Care | Site: Arm Upper | Laterality: Right | Wound class: Clean

## 2013-01-01 MED ORDER — MIDAZOLAM HCL 2 MG/2ML IJ SOLN: 0.5000 mg | Freq: Once | INTRAMUSCULAR | Status: DC | PRN

## 2013-01-01 MED ORDER — SODIUM CHLORIDE 0.9 % IV SOLN
INTRAVENOUS | Status: DC | PRN
  Administered 2013-01-01: 12:00:00 via INTRAVENOUS

## 2013-01-01 MED ORDER — FENTANYL CITRATE 0.05 MG/ML IJ SOLN
INTRAMUSCULAR | Status: DC | PRN
  Administered 2013-01-01: 25 ug via INTRAVENOUS

## 2013-01-01 MED ORDER — SODIUM CHLORIDE 0.9 % IV SOLN: INTRAVENOUS | Status: DC

## 2013-01-01 MED ORDER — FENTANYL CITRATE 0.05 MG/ML IJ SOLN: 25.0000 ug | INTRAMUSCULAR | Status: DC | PRN

## 2013-01-01 MED ORDER — PROPOFOL INFUSION 10 MG/ML OPTIME
INTRAVENOUS | Status: DC | PRN
  Administered 2013-01-01: 100 ug/kg/min via INTRAVENOUS

## 2013-01-01 MED ORDER — MIDAZOLAM HCL 5 MG/5ML IJ SOLN
INTRAMUSCULAR | Status: DC | PRN
  Administered 2013-01-01: 1 mg via INTRAVENOUS

## 2013-01-01 MED ORDER — HEPARIN SODIUM (PORCINE) 1000 UNIT/ML IJ SOLN
INTRAMUSCULAR | Status: DC | PRN
  Administered 2013-01-01: 4.6 mL

## 2013-01-01 MED ORDER — HEPARIN SODIUM (PORCINE) 1000 UNIT/ML IJ SOLN
INTRAMUSCULAR | Status: AC
  Filled 2013-01-01: qty 1

## 2013-01-01 MED ORDER — MEPERIDINE HCL 25 MG/ML IJ SOLN: 6.2500 mg | INTRAMUSCULAR | Status: DC | PRN

## 2013-01-01 MED ORDER — PROMETHAZINE HCL 25 MG/ML IJ SOLN: 6.2500 mg | INTRAMUSCULAR | Status: DC | PRN

## 2013-01-01 MED ORDER — SODIUM CHLORIDE 0.9 % IR SOLN
Status: DC | PRN
  Administered 2013-01-01: 13:00:00

## 2013-01-01 MED ORDER — OXYCODONE HCL 5 MG/5ML PO SOLN: 5.0000 mg | Freq: Once | ORAL | Status: DC | PRN

## 2013-01-01 MED ORDER — OXYCODONE HCL 5 MG PO TABS: 5.0000 mg | ORAL_TABLET | Freq: Once | ORAL | Status: DC | PRN

## 2013-01-01 MED ORDER — SODIUM CHLORIDE 0.9 % IV SOLN
INTRAVENOUS | Status: DC
  Administered 2013-01-01: 12:00:00 via INTRAVENOUS

## 2013-01-01 MED ORDER — LIDOCAINE-EPINEPHRINE (PF) 1 %-1:200000 IJ SOLN
INTRAMUSCULAR | Status: DC | PRN
  Administered 2013-01-01: 30 mL

## 2013-01-01 MED ORDER — SODIUM CHLORIDE 0.9 % IR SOLN
Status: DC | PRN
  Administered 2013-01-01: 1000 mL

## 2013-01-01 MED ORDER — MUPIROCIN 2 % EX OINT
TOPICAL_OINTMENT | Freq: Two times a day (BID) | CUTANEOUS | Status: DC
  Administered 2013-01-01: 09:00:00 via NASAL
  Filled 2013-01-01 (×2): qty 22

## 2013-01-01 MED ORDER — LIDOCAINE-EPINEPHRINE (PF) 1 %-1:200000 IJ SOLN
INTRAMUSCULAR | Status: AC
  Filled 2013-01-01: qty 10

## 2013-01-01 SURGICAL SUPPLY — 65 items
BAG BANDED W/RUBBER/TAPE 36X54 (MISCELLANEOUS) ×6 IMPLANT
BAG DECANTER FOR FLEXI CONT (MISCELLANEOUS) ×3 IMPLANT
BAG SNAP BAND KOVER 36X36 (MISCELLANEOUS) ×3 IMPLANT
CANISTER SUCTION 2500CC (MISCELLANEOUS) IMPLANT
CATH BEACON 5.038 65CM KMP-01 (CATHETERS) ×3 IMPLANT
CATH CANNON HEMO 15F 50CM (CATHETERS) IMPLANT
CATH CANNON HEMO 15FR 19 (HEMODIALYSIS SUPPLIES) IMPLANT
CATH CANNON HEMO 15FR 23CM (HEMODIALYSIS SUPPLIES) ×3 IMPLANT
CATH CANNON HEMO 15FR 31CM (HEMODIALYSIS SUPPLIES) IMPLANT
CATH CANNON HEMO 15FR 32CM (HEMODIALYSIS SUPPLIES) IMPLANT
CATH EMB 4FR 80CM (CATHETERS) IMPLANT
CLIP TI MEDIUM 6 (CLIP) IMPLANT
CLIP TI WIDE RED SMALL 6 (CLIP) IMPLANT
CLOTH BEACON ORANGE TIMEOUT ST (SAFETY) ×3 IMPLANT
COVER DOME SNAP 22 D (MISCELLANEOUS) ×3 IMPLANT
COVER PROBE W GEL 5X96 (DRAPES) ×3 IMPLANT
COVER SURGICAL LIGHT HANDLE (MISCELLANEOUS) ×3 IMPLANT
DERMABOND ADHESIVE PROPEN (GAUZE/BANDAGES/DRESSINGS) ×1
DERMABOND ADVANCED (GAUZE/BANDAGES/DRESSINGS) ×1
DERMABOND ADVANCED .7 DNX12 (GAUZE/BANDAGES/DRESSINGS) ×2 IMPLANT
DERMABOND ADVANCED .7 DNX6 (GAUZE/BANDAGES/DRESSINGS) ×2 IMPLANT
DRAPE C-ARM 42X72 X-RAY (DRAPES) IMPLANT
DRAPE CHEST BREAST 15X10 FENES (DRAPES) ×3 IMPLANT
DRAPE X-RAY CASS 24X20 (DRAPES) IMPLANT
ELECT REM PT RETURN 9FT ADLT (ELECTROSURGICAL)
ELECTRODE REM PT RTRN 9FT ADLT (ELECTROSURGICAL) IMPLANT
GAUZE SPONGE 2X2 8PLY STRL LF (GAUZE/BANDAGES/DRESSINGS) ×2 IMPLANT
GAUZE SPONGE 4X4 16PLY XRAY LF (GAUZE/BANDAGES/DRESSINGS) ×3 IMPLANT
GEL ULTRASOUND 20GR AQUASONIC (MISCELLANEOUS) IMPLANT
GLOVE BIOGEL PI IND STRL 7.5 (GLOVE) ×2 IMPLANT
GLOVE BIOGEL PI INDICATOR 7.5 (GLOVE) ×1
GLOVE SURG SS PI 7.5 STRL IVOR (GLOVE) ×3 IMPLANT
GOWN PREVENTION PLUS XXLARGE (GOWN DISPOSABLE) ×3 IMPLANT
GOWN STRL NON-REIN LRG LVL3 (GOWN DISPOSABLE) ×3 IMPLANT
HEMOSTAT SNOW SURGICEL 2X4 (HEMOSTASIS) IMPLANT
HEMOSTAT SURGICEL 2X14 (HEMOSTASIS) IMPLANT
KIT BASIN OR (CUSTOM PROCEDURE TRAY) ×3 IMPLANT
KIT ROOM TURNOVER OR (KITS) ×3 IMPLANT
NEEDLE 18GX1X1/2 (RX/OR ONLY) (NEEDLE) ×3 IMPLANT
NEEDLE HYPO 25GX1X1/2 BEV (NEEDLE) ×3 IMPLANT
NS IRRIG 1000ML POUR BTL (IV SOLUTION) ×3 IMPLANT
PACK CV ACCESS (CUSTOM PROCEDURE TRAY) IMPLANT
PACK SURGICAL SETUP 50X90 (CUSTOM PROCEDURE TRAY) ×3 IMPLANT
PAD ARMBOARD 7.5X6 YLW CONV (MISCELLANEOUS) ×6 IMPLANT
SET COLLECT BLD 21X3/4 12 (NEEDLE) IMPLANT
SOAP 2 % CHG 4 OZ (WOUND CARE) ×3 IMPLANT
SPONGE GAUZE 2X2 STER 10/PKG (GAUZE/BANDAGES/DRESSINGS) ×1
STOPCOCK 4 WAY LG BORE MALE ST (IV SETS) IMPLANT
SUT ETHILON 3 0 PS 1 (SUTURE) ×3 IMPLANT
SUT PROLENE 6 0 BV (SUTURE) IMPLANT
SUT VIC AB 3-0 SH 27 (SUTURE)
SUT VIC AB 3-0 SH 27X BRD (SUTURE) IMPLANT
SUT VICRYL 4-0 PS2 18IN ABS (SUTURE) IMPLANT
SYR 20CC LL (SYRINGE) ×6 IMPLANT
SYR 30ML LL (SYRINGE) IMPLANT
SYR 5ML LL (SYRINGE) ×3 IMPLANT
SYR CONTROL 10ML LL (SYRINGE) ×3 IMPLANT
SYRINGE 10CC LL (SYRINGE) ×3 IMPLANT
TAPE CLOTH SURG 4X10 WHT LF (GAUZE/BANDAGES/DRESSINGS) ×3 IMPLANT
TOWEL OR 17X24 6PK STRL BLUE (TOWEL DISPOSABLE) ×3 IMPLANT
TOWEL OR 17X26 10 PK STRL BLUE (TOWEL DISPOSABLE) ×3 IMPLANT
TUBING EXTENTION W/L.L. (IV SETS) IMPLANT
UNDERPAD 30X30 INCONTINENT (UNDERPADS AND DIAPERS) ×3 IMPLANT
WATER STERILE IRR 1000ML POUR (IV SOLUTION) ×3 IMPLANT
WIRE BENTSON .035X145CM (WIRE) ×3 IMPLANT

## 2013-01-01 NOTE — Telephone Encounter (Addendum)
Message copied by Gena Fray on Thu Jan 01, 2013  2:44 PM ------      Message from: Alfonso Patten      Created: Thu Jan 01, 2013  2:28 PM                   ----- Message -----         From: Serafina Mitchell, MD         Sent: 01/01/2013   2:02 PM           To: Patrici Ranks, Alfonso Patten, RN            01/01/2013:      .      Procedure:   Ultrasound-guided access, right internal jugular vein tunnel dialysis catheter            Please get the patient to come back to the office for evaluation of a new access. He will need vein mapping prior to his visit. He can be scheduled with whom ever has the next available appointment ------  01/01/13: lm for patient regarding appt, dpm

## 2013-01-01 NOTE — Anesthesia Postprocedure Evaluation (Signed)
  Anesthesia Post-op Note  Patient: Ernest [name]  Procedure(s) Performed: Procedure(s): INSERTION OF DIALYSIS CATHETER right  internal jugular (Right)  Patient Location: PACU  Anesthesia Type:MAC  Level of Consciousness: awake, alert , oriented and patient cooperative  Airway and Oxygen Therapy: Patient Spontanous Breathing  Post-op Pain: none  Post-op Assessment: Post-op Vital signs reviewed, Patient's Cardiovascular Status Stable, Respiratory Function Stable, Patent Airway, No signs of Nausea or vomiting and Pain level controlled  Post-op Vital Signs: Reviewed and stable  Complications: No apparent anesthesia complications

## 2013-01-01 NOTE — Op Note (Signed)
[facility]  Patient name: Ernest [name] MRN: [id number] DOB: 03-09-1946 Sex: male  01/01/2013 Pre-operative Diagnosis: End stage renal disease Post-operative diagnosis:  Same Surgeon:  Eldridge Abrahams Assistants:  None Procedure:   Ultrasound-guided access, right internal jugular vein tunnel dialysis catheter Anesthesia:  MAC  Blood Loss:  See anesthesia record   Indications:  The patient has an occluded right arm fistula. I have reviewed his previous fistulogram. This is not a candidate for operative interventional treatment  Procedure:  The patient was identified in the holding area and taken to [address]  The patient was then placed supine on the table. topical and MAC anesthesia was administered.  The patient was prepped and draped in the usual sterile fashion.  A time out was called and antibiotics were administered.  Ultrasound was used to evaluate the right internal jugular vein. It was widely patent and easily compressible. 1% lidocaine was used for local anesthesia. A #11 blade was used to make a skin nick. The right internal jugular vein was cannulated under ultrasound guidance with an 18-gauge needle. I had trouble passing a J-tipped wire into the superior vena cava, and therefore a Kumpe catheter was placed over the J-wire which was removed. I then used a Benson wire I was able to navigate the wire into the inferior vena cava. The subcutaneous tract was dilated with sequential dilators and a peel-away sheath was placed. A 28 cm tunneled dialysis catheter was then advanced through the sheath which was then removed. A skin exit site was selected. A #11 blade was used to make the skin incision. A subcutaneous tunnel was created and then dilated. The catheter was then brought through the tunnel and the cuff was situated at the skin exit site. It was connected to the remaining portion of the catheter. Both ports flushed and aspirated without  difficulty. Fluoroscopy was used to confirm that the catheter tip was in good position and that there were no changes within the catheter. The catheter was sewn into position with a 3-0 nylon. The skin incision the neck was closed with a 4-0 Vicryl. There were no complications. The catheter was filled the appropriate volumes of heparin   Disposition:  To PACU in stable condition.   Theotis Burrow, M.D. [facility] [place] Office: [phone] Pager:  [phone]

## 2013-01-01 NOTE — Transfer of Care (Signed)
Immediate Anesthesia Transfer of Care Note  Patient: Ernest [name]  Procedure(s) Performed: Procedure(s): INSERTION OF DIALYSIS CATHETER right  internal jugular (Right)  Patient Location: PACU  Anesthesia Type:MAC  Level of Consciousness: awake, alert  and oriented  Airway & Oxygen Therapy: Patient Spontanous Breathing  Post-op Assessment: Report given to PACU RN  Post vital signs: stable  Complications: No apparent anesthesia complications

## 2013-01-01 NOTE — Anesthesia Preprocedure Evaluation (Addendum)
Anesthesia Evaluation  Patient identified by MRN, date of birth, ID band Patient awake    Reviewed: Allergy & Precautions, H&P , NPO status , Patient's Chart, lab work & pertinent test results, reviewed documented beta blocker date and time   History of Anesthesia Complications Negative for: history of anesthetic complications  Airway Mallampati: II TM Distance: >3 FB Neck ROM: Full    Dental  (+) Dental Advisory Given, Edentulous Upper and Poor Dentition   Pulmonary shortness of breath and with exertion, COPD (last inhaler needed a few months sgo) COPD inhaler, former smoker,  breath sounds clear to auscultation  Pulmonary exam normal       Cardiovascular hypertension, Pt. on medications and Pt. on home beta blockers + CAD (s/p cath November 2013 with modest disease involving the ostial left main, proximal LAD, proximal RCA - do not appear to be hemodynamically signficant; mild LV dysfunction ) Rhythm:Regular Rate:Normal  11/13 myoview:  Small area of mid and apical inferior wall ischemia Diffuse hypokinesis worse in the apex EF 40% LV Ejection Fraction: 40%.  LV Wall Motion:  Diffuse hypokinesis worse in the apex       Neuro/Psych PSYCHIATRIC DISORDERS Anxiety Depression negative neurological ROS     GI/Hepatic negative GI ROS, (+) Hepatitis -, C  Endo/Other  diabetes (glu 155), Well Controlled, Type 2, Insulin DependentMorbid obesity  Renal/GU ESRF and DialysisRenal disease (K+ 5.2, last dialysis yesterday)     Musculoskeletal   Abdominal (+) + obese,   Peds  Hematology   Anesthesia Other Findings   Reproductive/Obstetrics                       Anesthesia Physical Anesthesia Plan  ASA: III  Anesthesia Plan: MAC   Post-op Pain Management:    Induction: Intravenous  Airway Management Planned: Natural Airway and Simple Face Mask  Additional Equipment:   Intra-op Plan:   Post-operative  Plan:   Informed Consent: I have reviewed the patients History and Physical, chart, labs and discussed the procedure including the risks, benefits and alternatives for the proposed anesthesia with the patient or authorized representative who has indicated his/her understanding and acceptance.   Dental advisory given  Plan Discussed with: Surgeon and CRNA  Anesthesia Plan Comments: (Plan routine monitors, MAC)        Anesthesia Quick Evaluation

## 2013-01-01 NOTE — Progress Notes (Signed)
Pt is to return to HD center at regular time tomorrow 3/21 - per Smitty Knudsen PA.

## 2013-01-01 NOTE — Anesthesia Procedure Notes (Signed)
Procedure Name: MAC Date/Time: 01/01/2013 12:55 PM Performed by: Maeola Harman Pre-anesthesia Checklist: Patient identified, Emergency Drugs available, Suction available, Patient being monitored and Timeout performed Patient Re-evaluated:Patient Re-evaluated prior to inductionOxygen Delivery Method: Circle system utilized Preoxygenation: Pre-oxygenation with 100% oxygen Intubation Type: IV induction Placement Confirmation: positive ETCO2 Dental Injury: Teeth and Oropharynx as per pre-operative assessment

## 2013-01-01 NOTE — H&P (Signed)
[facility]   Patient name: Ernest [name] MRN: [id number] DOB: 16-Nov-1945 Sex: male    No chief complaint on file.   HISTORY OF PRESENT ILLNESS: History right arm fistula that is now occluded. He recently underwent a fistulogram which showed diffuse stenosis within the vein.  Past Medical History  Diagnosis Date  . Abnormal electrocardiogram   . Hyperlipidemia   . Retinopathy   . Gout   . ESRD (end stage renal disease)     on hemodialysis Mon, Wed, Fri - followed by [name]  . Type II or unspecified type diabetes mellitus without mention of complication, not stated as uncontrolled     adult onset  . Erectile dysfunction     penile implant  . Hepatitis C   . Abnormal stress test     s/p cath November 2013 with modest disease involving the ostial left main, proximal LAD, proximal RCA - do not appear to be hemodynamically signficant; mild LV dysfunction  . Obesity   . Shortness of breath     with exertion    Past Surgical History  Procedure Laterality Date  . Penile prosthesis implant    . Fistula      RUE and wrist  . Cardiac catheterization  August 26, 2012  . Eye surgery      laser.  and surgery for DM    History   Social History  . Marital Status: Married    Spouse Name: N/A    Number of Children: N/A  . Years of Education: N/A   Occupational History  . 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 Smoking status: Former Smoker    Quit date: 06/10/1973  . Smokeless tobacco: Never Used  . Alcohol Use: No  . Drug Use: No  . Sexually Active: No   Other Topics Concern  . Not on file   Social History Narrative   Adopted mentally retarded child at home, 2 adopted children, 3 living and 2 deceased.    Family History  Problem Relation Age of Onset  . Heart disease Father     Allergies as of 12/31/2012  . ([name])    No current facility-administered medications on file prior to  encounter.   Current Outpatient Prescriptions on File Prior to Encounter  Medication Sig Dispense Refill  . allopurinol (ZYLOPRIM) 100 MG tablet Take 100 mg by mouth daily.       [name] amitriptyline (ELAVIL) 25 MG tablet Take 25 mg by mouth at bedtime.      [name] aspirin 325 MG tablet Take 325 mg by mouth daily.        . Calcium Acetate 667 MG TABS Take 2-3 tablets by mouth 3 (three) times daily with meals. Takes 3 capsules with meals and 2 capsules with snacks      . clopidogrel (PLAVIX) 75 MG tablet Take 75 mg by mouth daily.        . colchicine 0.6 MG tablet Take 0.6 mg by mouth daily.        . insulin NPH (HUMULIN N,NOVOLIN N) 100 UNIT/ML injection Inject 30-50 Units into the skin 2 (two) times daily. Takes 50 units in the morning and 30 units at night      . insulin regular (NOVOLIN R,HUMULIN R) 100 units/mL injection Inject 30-40 Units into the skin 3 (three) times daily before meals. Takes 40 units in the morning; Takes 30 units midday;  Takes 40 units at night      . multivitamin (RENA-VIT) TABS tablet Take 1 tablet by mouth daily.        . sevelamer (RENVELA) 800 MG tablet Take 1,600 mg by mouth 3 (three) times daily with meals.      . simvastatin (ZOCOR) 20 MG tablet Take 20 mg by mouth at bedtime.            PHYSICAL EXAMINATION:   Vital signs are BP 211/80  Pulse 73  Temp(Src) 99.1 F (37.3 C) (Oral)  Resp 20  Ht 5\' 9"  (1.753 m)  Wt 255 lb (115.667 kg)  BMI 37.64 kg/m2  SpO2 98% General: The patient appears their stated age. HEENT:  No gross abnormalities Pulmonary:  Non labored breathing Musculoskeletal: There are no major deformities. Neurologic: No focal weakness or paresthesias are detected, Skin: There are no ulcer or rashes noted. Psychiatric: The patient has normal affect. Cardiovascular: There is a regular rate and rhythm without significant murmur appreciated. Occluded right arm fistula   Diagnostic Studies I have reviewed his most recent fistulogram. This shows  diffuse stenosis within the proximal portion of the cephalic vein and stents.  Assessment: End-stage renal disease with thrombosed fistula Plan: After reviewing the patient's fistulogram from several months ago, I do not feel that he is a candidate for revision. Therefore I will place a catheter and plan new access in the immediate future. This was discussed with the patient and he agrees  V. Leia Alf, M.D. [facility] [place] Office: [phone] Pager:  [phone]

## 2013-01-01 NOTE — Preoperative (Signed)
Beta Blockers   Reason not to administer Beta Blockers:Not Applicable 

## 2013-01-02 ENCOUNTER — Encounter (HOSPITAL_COMMUNITY): Payer: Self-pay | Admitting: Surgery

## 2013-01-05 ENCOUNTER — Encounter: Payer: Self-pay | Admitting: Vascular Surgery

## 2013-01-06 ENCOUNTER — Encounter: Payer: Self-pay | Admitting: Vascular Surgery

## 2013-01-06 ENCOUNTER — Ambulatory Visit (INDEPENDENT_AMBULATORY_CARE_PROVIDER_SITE_OTHER): Payer: Medicare Other | Admitting: Vascular Surgery

## 2013-01-06 ENCOUNTER — Other Ambulatory Visit: Payer: Self-pay | Admitting: *Deleted

## 2013-01-06 ENCOUNTER — Encounter (INDEPENDENT_AMBULATORY_CARE_PROVIDER_SITE_OTHER): Payer: Medicare Other | Admitting: *Deleted

## 2013-01-06 ENCOUNTER — Encounter: Payer: Self-pay | Admitting: *Deleted

## 2013-01-06 VITALS — BP 148/80 | HR 85 | Ht 69.0 in | Wt 268.0 lb

## 2013-01-06 DIAGNOSIS — N186 End stage renal disease: Secondary | ICD-10-CM | POA: Diagnosis not present

## 2013-01-06 DIAGNOSIS — Z01810 Encounter for preprocedural cardiovascular examination: Secondary | ICD-10-CM

## 2013-01-06 NOTE — Progress Notes (Signed)
[facility] [place] HISTORY AND PHYSICAL   CC:  In need of new access Ernest [name],*  HPI: This is a 67 y.o. male here for evaluation for new access.  He had a right brachiocephalic AVF placed in [id number].  He has had multiple procedures on this since then with a simple thrombectomy in November of 2011 then venoplasty in December 2011.  In March 2012, he underwent T&R and then angioplasty 01/01/11 with findings of an 80% right distal cephalic vein stenosis with successful PTA and stenting with minimal residual stenosis.    He presents today for evaluation for new access.  He states that his RUA AVF stopped working last Wednesday.  At that time, he had a right IJ diatek catheter placed 01/01/13.  He is on Plavix.  He did have a right CEA July 2013 by Dr. Donnetta Hutching.  The pt does have a hx of hepatitis C.  Past Medical History  Diagnosis Date  . Abnormal electrocardiogram   . Hyperlipidemia   . Retinopathy   . Gout   . ESRD (end stage renal disease)     on hemodialysis Mon, Wed, Fri - followed by [name]  . Type II or unspecified type diabetes mellitus without mention of complication, not stated as uncontrolled     adult onset  . Erectile dysfunction     penile implant  . Hepatitis C   . Abnormal stress test     s/p cath November 2013 with modest disease involving the ostial left main, proximal LAD, proximal RCA - do not appear to be hemodynamically signficant; mild LV dysfunction  . Obesity   . Shortness of breath     with exertion   Past Surgical History  Procedure Laterality Date  . Penile prosthesis implant    . Fistula      RUE and wrist  . Cardiac catheterization  August 26, 2012  . Eye surgery      laser.  and surgery for DM  . Insertion of dialysis catheter Right 01/01/2013    Procedure: INSERTION OF DIALYSIS CATHETER right  internal jugular;  Surgeon: Serafina Mitchell, MD;  Location: [facility] [facility];  Service: Vascular;  Laterality: Right;    No  Known Allergies  Current Outpatient Prescriptions  Medication Sig Dispense Refill  . aspirin 325 MG tablet Take 325 mg by mouth daily.        . Calcium Acetate 667 MG TABS Take 2-3 tablets by mouth 3 (three) times daily with meals. Takes 3 capsules with meals and 2 capsules with snacks      . cinacalcet (SENSIPAR) 60 MG tablet Take 60 mg by mouth daily.      . clopidogrel (PLAVIX) 75 MG tablet Take 75 mg by mouth daily.        . colchicine 0.6 MG tablet Take 0.6 mg by mouth daily.       . insulin NPH (HUMULIN N,NOVOLIN N) 100 UNIT/ML injection Inject 30-50 Units into the skin 2 (two) times daily. Takes 50 units in the morning and 30 units at night      . insulin regular (NOVOLIN R,HUMULIN R) 100 units/mL injection Inject 30-40 Units into the skin 3 (three) times daily before meals. Takes 40 units in the morning; Takes 30 units midday; Takes 40 units at night      . lisinopril (PRINIVIL,ZESTRIL) 10 MG tablet Take 10 mg by mouth daily.      . metoprolol (LOPRESSOR) 50 MG tablet Take  50 mg by mouth daily.      . multivitamin (RENA-VIT) TABS tablet Take 1 tablet by mouth daily.        . sevelamer (RENVELA) 800 MG tablet Take 1,600 mg by mouth 3 (three) times daily with meals.      . simvastatin (ZOCOR) 20 MG tablet Take 20 mg by mouth at bedtime.        [name] allopurinol (ZYLOPRIM) 100 MG tablet Take 100 mg by mouth daily.       [name] amitriptyline (ELAVIL) 25 MG tablet Take 25 mg by mouth at bedtime.      . lidocaine-prilocaine (EMLA) cream Apply 1 application topically 3 (three) times a week. Prior to dialysis       No current facility-administered medications for this visit.    Family History  Problem Relation Age of Onset  . Heart disease Father     History   Social History  . Marital Status: Married    Spouse Name: N/A    Number of Children: N/A  . Years of Education: N/A   Occupational History  . 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 Smoking status: Former  Smoker    Quit date: 06/10/1973  . Smokeless tobacco: Never Used  . Alcohol Use: No  . Drug Use: No  . Sexually Active: No   Other Topics Concern  . Not on file   Social History Narrative   Adopted mentally retarded child at home, 2 adopted children, 3 living and 2 deceased.     ROS: [x]  Positive   [ ]  Negative   [ ]  All sytems reviewed and are negative  Cardiovascular: [] chest pain; [] chest pressure; [] palpitations; [] SOB lying flat; [] DOE; [] pain in legs with walking; [] pain in legs when lying flat; [] Hx of DVT; [] Hx phlebitis; [] swelling in legs; [] varicose veins Pulmonary: [] productive cough; [] asthma; [] wheezing Neurologic:  [] Hx CVA;  [x] weakness in arms or legs after HD; [] numbness in arms or legs; [] difficulty in speaking or slurred speech; [] temporary loss of vision in one eye; [] dizziness Hematologic:  [] bleeding problems; [] clots easily GI:  [] vomiting blood; []  blood in stool; [] PUD GU: []  Dysuria; [] hematuria Psychiatric:  [] Hx major depression Integumentary:  [] rashes; [] ulcers Constitutional:  [] fever; [] chills    PHYSICAL EXAMINATION:  Filed Vitals:   01/06/13 1440  BP: 148/80  Pulse: 85   Body mass index is 39.56 kg/(m^2).  General:  WDWN in NAD Gait: Normal HENT: WNL, well healed right CEA scar Eyes: PERRL Pulmonary: normal non-labored breathing , without Rales, rhonchi,  wheezing Cardiac: RRR, without  Murmurs, rubs or gallops; No carotid bruits Abdomen: firm, NT, no masses; obese Skin: no rashes, ulcers noted Vascular Exam/Pulses: right IJ diatek catheter in place.  He does have a right and left brachiocephalic AVF that is without thrill or bruit.  He does have a left radiocephalic AVF, which continues to have a thrill, but this has never matured.  1+ radial pulse on the right 2+ radial pulse on the left.  Grips are equal bilaterally. Extremities without ischemic changes, no Gangrene , no cellulitis; no open wounds;  Musculoskeletal: no muscle  wasting or atrophy  Neurologic: A&O X 3; Appropriate Affect ; SENSATION: normal; MOTOR FUNCTION:  moving all extremities equally. Speech is fluent/normal  Non-Invasive Vascular Imaging:  Upper Extremity Vein Mapping:  1.  Patent bilateral basilic and forearm level cephalic veins.   2.  Evidence of old AVF noted.  Additional findings:  1.  The bilateral upper arm cephalic veins have been used for AVF and are occluded. 2.  Patent left radiocephalic AVF with a patent branch that communicates b/w the mid forearm cephalic and proximal forearm basilic veins. 3.  The bilateral basilic veins are compressible.   ASSESSMENT/PLAN: 67 y.o. male who had failure of his RUA AVF last week with diatek catheter placement presents today for new access.  - His vein map is reviewed.  It is possible that he could have a left BVT, but not probable.  Will look at left basilic vein in the OR with ultrasound.  Will most likely need left FA loop graft or LUA AVF.   Leontine Locket, [name] [facility] [phone]  Clinic MD:  Pt seen and examined in conjunction with Dr. Donnetta Hutching.  I have examined the patient, reviewed and agree with above. The patient has had wrist and antecubital fistulas bilaterally. His only option remaining is graft in all likelihood. His vein map showed relatively small left basilic vein. I have recommended either a left forearm loop or left upper arm AV graft. I explained to procedure if his basilic vein looked better he may be able to have a basilic vein transposition but I suspect this is doubtful. He understands and will be scheduled on nondialysis day on Tuesday or Thursday  EARLY, TODD, MD 01/06/2013 4:24 PM

## 2013-01-07 ENCOUNTER — Other Ambulatory Visit: Payer: Self-pay

## 2013-01-07 DIAGNOSIS — Z01810 Encounter for preprocedural cardiovascular examination: Secondary | ICD-10-CM

## 2013-01-07 DIAGNOSIS — N186 End stage renal disease: Secondary | ICD-10-CM

## 2013-01-08 ENCOUNTER — Encounter (HOSPITAL_COMMUNITY): Payer: Self-pay | Admitting: Pharmacy Technician

## 2013-01-12 ENCOUNTER — Encounter (HOSPITAL_COMMUNITY): Payer: Self-pay | Admitting: *Deleted

## 2013-01-12 DIAGNOSIS — N186 End stage renal disease: Secondary | ICD-10-CM | POA: Diagnosis not present

## 2013-01-12 MED ORDER — DEXTROSE 5 % IV SOLN
1.5000 g | INTRAVENOUS | Status: AC
  Administered 2013-01-13: 1.5 g via INTRAVENOUS
  Filled 2013-01-12 (×2): qty 1.5

## 2013-01-13 ENCOUNTER — Encounter (HOSPITAL_COMMUNITY): Payer: Self-pay | Admitting: *Deleted

## 2013-01-13 ENCOUNTER — Encounter (HOSPITAL_COMMUNITY)
Admission: RE | Disposition: A | Discharge status: Home / Self Care | Payer: Self-pay | Source: Ambulatory Visit | Attending: Vascular Surgery

## 2013-01-13 ENCOUNTER — Ambulatory Visit (HOSPITAL_COMMUNITY)
Admission: RE | Admit: 2013-01-13 | Discharge: 2013-01-13 | Disposition: A | Discharge status: Home / Self Care | Payer: Medicare Other | Source: Ambulatory Visit | Attending: Vascular Surgery | Admitting: Vascular Surgery

## 2013-01-13 ENCOUNTER — Encounter (HOSPITAL_COMMUNITY): Payer: Self-pay | Admitting: Anesthesiology

## 2013-01-13 ENCOUNTER — Ambulatory Visit (HOSPITAL_COMMUNITY): Payer: Medicare Other | Admitting: Anesthesiology

## 2013-01-13 DIAGNOSIS — Z79899 Other long term (current) drug therapy: Secondary | ICD-10-CM | POA: Insufficient documentation

## 2013-01-13 DIAGNOSIS — N186 End stage renal disease: Secondary | ICD-10-CM

## 2013-01-13 DIAGNOSIS — I871 Compression of vein: Secondary | ICD-10-CM | POA: Insufficient documentation

## 2013-01-13 DIAGNOSIS — N529 Male erectile dysfunction, unspecified: Secondary | ICD-10-CM | POA: Insufficient documentation

## 2013-01-13 DIAGNOSIS — E119 Type 2 diabetes mellitus without complications: Secondary | ICD-10-CM | POA: Diagnosis not present

## 2013-01-13 DIAGNOSIS — I120 Hypertensive chronic kidney disease with stage 5 chronic kidney disease or end stage renal disease: Secondary | ICD-10-CM | POA: Diagnosis not present

## 2013-01-13 DIAGNOSIS — Z794 Long term (current) use of insulin: Secondary | ICD-10-CM | POA: Insufficient documentation

## 2013-01-13 DIAGNOSIS — E785 Hyperlipidemia, unspecified: Secondary | ICD-10-CM | POA: Insufficient documentation

## 2013-01-13 DIAGNOSIS — M109 Gout, unspecified: Secondary | ICD-10-CM | POA: Diagnosis not present

## 2013-01-13 DIAGNOSIS — H3500 Unspecified background retinopathy: Secondary | ICD-10-CM | POA: Insufficient documentation

## 2013-01-13 DIAGNOSIS — Z7982 Long term (current) use of aspirin: Secondary | ICD-10-CM | POA: Insufficient documentation

## 2013-01-13 DIAGNOSIS — Z87891 Personal history of nicotine dependence: Secondary | ICD-10-CM | POA: Diagnosis not present

## 2013-01-13 DIAGNOSIS — E669 Obesity, unspecified: Secondary | ICD-10-CM | POA: Diagnosis not present

## 2013-01-13 DIAGNOSIS — Y832 Surgical operation with anastomosis, bypass or graft as the cause of abnormal reaction of the patient, or of later complication, without mention of misadventure at the time of the procedure: Secondary | ICD-10-CM | POA: Insufficient documentation

## 2013-01-13 DIAGNOSIS — B1920 Unspecified viral hepatitis C without hepatic coma: Secondary | ICD-10-CM | POA: Insufficient documentation

## 2013-01-13 DIAGNOSIS — T82898A Other specified complication of vascular prosthetic devices, implants and grafts, initial encounter: Secondary | ICD-10-CM | POA: Diagnosis not present

## 2013-01-13 DIAGNOSIS — Z6837 Body mass index (BMI) 37.0-37.9, adult: Secondary | ICD-10-CM | POA: Insufficient documentation

## 2013-01-13 DIAGNOSIS — I10 Essential (primary) hypertension: Secondary | ICD-10-CM | POA: Diagnosis not present

## 2013-01-13 HISTORY — DX: Essential (primary) hypertension: I10

## 2013-01-13 HISTORY — PX: AV FISTULA PLACEMENT: SHX1204

## 2013-01-13 LAB — GLUCOSE, CAPILLARY
Glucose-Capillary: 136 mg/dL — ABNORMAL HIGH (ref 70–99)
Glucose-Capillary: 173 mg/dL — ABNORMAL HIGH (ref 70–99)

## 2013-01-13 LAB — COMPREHENSIVE METABOLIC PANEL
Albumin: 3.5 g/dL (ref 3.5–5.2)
BUN: 39 mg/dL — ABNORMAL HIGH (ref 6–23)
CO2: 23 mEq/L (ref 19–32)
Calcium: 9.1 mg/dL (ref 8.4–10.5)
Chloride: 97 mEq/L (ref 96–112)
Creatinine, Ser: 8.55 mg/dL — ABNORMAL HIGH (ref 0.50–1.35)
GFR calc non Af Amer: 6 mL/min — ABNORMAL LOW (ref >90)
Total Bilirubin: 0.2 mg/dL — ABNORMAL LOW (ref 0.3–1.2)

## 2013-01-13 LAB — POCT I-STAT 4, (NA,K, GLUC, HGB,HCT)
Glucose, Bld: 96 mg/dL (ref 70–99)
Potassium: 4 mEq/L (ref 3.5–5.1)
Sodium: 140 mEq/L (ref 135–145)

## 2013-01-13 SURGERY — INSERTION OF ARTERIOVENOUS (AV) GORE-TEX GRAFT ARM
Anesthesia: General | Site: Arm Upper | Laterality: Left

## 2013-01-13 MED ORDER — PROPOFOL 10 MG/ML IV BOLUS
INTRAVENOUS | Status: DC | PRN
  Administered 2013-01-13: 170 mg via INTRAVENOUS

## 2013-01-13 MED ORDER — THROMBIN 20000 UNITS EX SOLR
OROMUCOSAL | Status: DC | PRN
  Administered 2013-01-13: 11:00:00 via TOPICAL

## 2013-01-13 MED ORDER — OXYCODONE HCL 5 MG PO TABS: 5.0000 mg | ORAL_TABLET | Freq: Once | ORAL | Status: DC | PRN

## 2013-01-13 MED ORDER — SODIUM CHLORIDE 0.9 % IR SOLN
Status: DC | PRN
  Administered 2013-01-13: 10:00:00

## 2013-01-13 MED ORDER — THROMBIN 20000 UNITS EX SOLR
CUTANEOUS | Status: AC
  Filled 2013-01-13: qty 20000

## 2013-01-13 MED ORDER — ONDANSETRON HCL 4 MG/2ML IJ SOLN
INTRAMUSCULAR | Status: DC | PRN
  Administered 2013-01-13: 4 mg via INTRAVENOUS

## 2013-01-13 MED ORDER — PAPAVERINE HCL 30 MG/ML IJ SOLN
INTRAMUSCULAR | Status: AC
  Filled 2013-01-13: qty 2

## 2013-01-13 MED ORDER — HEPARIN SODIUM (PORCINE) 1000 UNIT/ML IJ SOLN
2400.0000 [IU] | Freq: Once | INTRAMUSCULAR | Status: AC
  Administered 2013-01-13: 2400 [IU] via INTRAVENOUS

## 2013-01-13 MED ORDER — 0.9 % SODIUM CHLORIDE (POUR BTL) OPTIME
TOPICAL | Status: DC | PRN
  Administered 2013-01-13: 1000 mL

## 2013-01-13 MED ORDER — MIDAZOLAM HCL 5 MG/5ML IJ SOLN
INTRAMUSCULAR | Status: DC | PRN
  Administered 2013-01-13: 1 mg via INTRAVENOUS

## 2013-01-13 MED ORDER — HEPARIN SODIUM (PORCINE) 1000 UNIT/ML IJ SOLN
INTRAMUSCULAR | Status: DC | PRN
  Administered 2013-01-13: 9 mL via INTRAVENOUS

## 2013-01-13 MED ORDER — OXYCODONE HCL 5 MG/5ML PO SOLN: 5.0000 mg | Freq: Once | ORAL | Status: DC | PRN

## 2013-01-13 MED ORDER — FENTANYL CITRATE 0.05 MG/ML IJ SOLN: 25.0000 ug | INTRAMUSCULAR | Status: DC | PRN

## 2013-01-13 MED ORDER — PROTAMINE SULFATE 10 MG/ML IV SOLN
INTRAVENOUS | Status: DC | PRN
  Administered 2013-01-13: 40 mg via INTRAVENOUS

## 2013-01-13 MED ORDER — FENTANYL CITRATE 0.05 MG/ML IJ SOLN
INTRAMUSCULAR | Status: DC | PRN
  Administered 2013-01-13 (×3): 50 ug via INTRAVENOUS

## 2013-01-13 MED ORDER — SODIUM CHLORIDE 0.9 % IV SOLN
INTRAVENOUS | Status: DC
  Administered 2013-01-13: 09:00:00 via INTRAVENOUS

## 2013-01-13 MED ORDER — ONDANSETRON HCL 4 MG/2ML IJ SOLN: 4.0000 mg | Freq: Four times a day (QID) | INTRAMUSCULAR | Status: DC | PRN

## 2013-01-13 SURGICAL SUPPLY — 39 items
CANISTER SUCTION 2500CC (MISCELLANEOUS) ×2 IMPLANT
CLIP TI MEDIUM 24 (CLIP) ×2 IMPLANT
CLIP TI WIDE RED SMALL 24 (CLIP) ×2 IMPLANT
CLOTH BEACON ORANGE TIMEOUT ST (SAFETY) ×2 IMPLANT
COVER PROBE W GEL 5X96 (DRAPES) IMPLANT
COVER SURGICAL LIGHT HANDLE (MISCELLANEOUS) ×2 IMPLANT
DECANTER SPIKE VIAL GLASS SM (MISCELLANEOUS) ×2 IMPLANT
DERMABOND ADVANCED (GAUZE/BANDAGES/DRESSINGS) ×1
DERMABOND ADVANCED .7 DNX12 (GAUZE/BANDAGES/DRESSINGS) ×1 IMPLANT
DRAIN PENROSE 1/2X12 LTX STRL (WOUND CARE) IMPLANT
ELECT REM PT RETURN 9FT ADLT (ELECTROSURGICAL) ×2
ELECTRODE REM PT RTRN 9FT ADLT (ELECTROSURGICAL) ×1 IMPLANT
GLOVE BIO SURGEON STRL SZ 6.5 (GLOVE) ×6 IMPLANT
GLOVE BIO SURGEON STRL SZ7.5 (GLOVE) ×4 IMPLANT
GLOVE BIOGEL PI IND STRL 6.5 (GLOVE) ×1 IMPLANT
GLOVE BIOGEL PI IND STRL 7.5 (GLOVE) ×1 IMPLANT
GLOVE BIOGEL PI IND STRL 8 (GLOVE) ×1 IMPLANT
GLOVE BIOGEL PI INDICATOR 6.5 (GLOVE) ×1
GLOVE BIOGEL PI INDICATOR 7.5 (GLOVE) ×1
GLOVE BIOGEL PI INDICATOR 8 (GLOVE) ×1
GLOVE SURG SS PI 7.0 STRL IVOR (GLOVE) ×6 IMPLANT
GOWN STRL NON-REIN LRG LVL3 (GOWN DISPOSABLE) ×4 IMPLANT
GOWN STRL REIN XL XLG (GOWN DISPOSABLE) ×4 IMPLANT
GRAFT GORETEX STRT 4-7X45 (Vascular Products) ×2 IMPLANT
KIT BASIN OR (CUSTOM PROCEDURE TRAY) ×2 IMPLANT
KIT ROOM TURNOVER OR (KITS) ×2 IMPLANT
NS IRRIG 1000ML POUR BTL (IV SOLUTION) ×2 IMPLANT
PACK CV ACCESS (CUSTOM PROCEDURE TRAY) ×2 IMPLANT
PAD ARMBOARD 7.5X6 YLW CONV (MISCELLANEOUS) ×4 IMPLANT
SPONGE GAUZE 4X4 12PLY (GAUZE/BANDAGES/DRESSINGS) ×2 IMPLANT
SPONGE SURGIFOAM ABS GEL 100 (HEMOSTASIS) IMPLANT
SUT PROLENE 6 0 BV (SUTURE) ×6 IMPLANT
SUT VIC AB 3-0 SH 27 (SUTURE) ×2
SUT VIC AB 3-0 SH 27X BRD (SUTURE) ×2 IMPLANT
SUT VICRYL 4-0 PS2 18IN ABS (SUTURE) ×4 IMPLANT
TOWEL OR 17X24 6PK STRL BLUE (TOWEL DISPOSABLE) ×2 IMPLANT
TOWEL OR 17X26 10 PK STRL BLUE (TOWEL DISPOSABLE) ×2 IMPLANT
UNDERPAD 30X30 INCONTINENT (UNDERPADS AND DIAPERS) ×2 IMPLANT
WATER STERILE IRR 1000ML POUR (IV SOLUTION) ×2 IMPLANT

## 2013-01-13 NOTE — Transfer of Care (Signed)
Immediate Anesthesia Transfer of Care Note  Patient: Ernest [name]  Procedure(s) Performed: Procedure(s): INSERTION OF ARTERIOVENOUS (AV) GORE-TEX GRAFT ARM (Left)  Patient Location: PACU  Anesthesia Type:General  Level of Consciousness: patient cooperative, lethargic and responds to stimulation  Airway & Oxygen Therapy: Patient Spontanous Breathing and Patient connected to nasal cannula oxygen  Post-op Assessment: Report given to PACU RN, Post -op Vital signs reviewed and stable and Patient moving all extremities  Post vital signs: Reviewed and stable  Complications: No apparent anesthesia complications

## 2013-01-13 NOTE — Progress Notes (Signed)
0845  Unable to insert IV in usual fashion.  Have spoken with Dr. Marcie Bal and he has given the ok to use the patient's diatek for IV access.  After putting on gloves and mask, i accessed the venous port of the diatek, aspirated 8cc of blood and then hooked up the normal saline bag.  DA

## 2013-01-13 NOTE — Progress Notes (Signed)
HD blue port capped off. Flushed with 10cc NS with GBR. Flushed with Heparin 2.4ml (1000u/ml) per priming volume in the blue port.  Cap cleaned, dead end cap placed on the blue port, both clamps taped, red "high dose heparin" sticker placed on the cathter.  Tannie Koskela M  

## 2013-01-13 NOTE — H&P (View-Only) (Signed)
[facility]   Patient name: Ernest [name] MRN: [id number] DOB: 08-30-1946 Sex: male    No chief complaint on file.   HISTORY OF PRESENT ILLNESS: History right arm fistula that is now occluded. He recently underwent a fistulogram which showed diffuse stenosis within the vein.  Past Medical History  Diagnosis Date  . Abnormal electrocardiogram   . Hyperlipidemia   . Retinopathy   . Gout   . ESRD (end stage renal disease)     on hemodialysis Mon, Wed, Fri - followed by [name]  . Type II or unspecified type diabetes mellitus without mention of complication, not stated as uncontrolled     adult onset  . Erectile dysfunction     penile implant  . Hepatitis C   . Abnormal stress test     s/p cath November 2013 with modest disease involving the ostial left main, proximal LAD, proximal RCA - do not appear to be hemodynamically signficant; mild LV dysfunction  . Obesity   . Shortness of breath     with exertion    Past Surgical History  Procedure Laterality Date  . Penile prosthesis implant    . Fistula      RUE and wrist  . Cardiac catheterization  August 26, 2012  . Eye surgery      laser.  and surgery for DM    History   Social History  . Marital Status: Married    Spouse Name: N/A    Number of Children: N/A  . Years of Education: N/A   Occupational History  . 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 Smoking status: Former Smoker    Quit date: 06/10/1973  . Smokeless tobacco: Never Used  . Alcohol Use: No  . Drug Use: No  . Sexually Active: No   Other Topics Concern  . Not on file   Social History Narrative   Adopted mentally retarded child at home, 2 adopted children, 3 living and 2 deceased.    Family History  Problem Relation Age of Onset  . Heart disease Father     Allergies as of 12/31/2012  . ([name])    No current facility-administered medications on file prior to  encounter.   Current Outpatient Prescriptions on File Prior to Encounter  Medication Sig Dispense Refill  . allopurinol (ZYLOPRIM) 100 MG tablet Take 100 mg by mouth daily.       [name] amitriptyline (ELAVIL) 25 MG tablet Take 25 mg by mouth at bedtime.      [name] aspirin 325 MG tablet Take 325 mg by mouth daily.        . Calcium Acetate 667 MG TABS Take 2-3 tablets by mouth 3 (three) times daily with meals. Takes 3 capsules with meals and 2 capsules with snacks      . clopidogrel (PLAVIX) 75 MG tablet Take 75 mg by mouth daily.        . colchicine 0.6 MG tablet Take 0.6 mg by mouth daily.        . insulin NPH (HUMULIN N,NOVOLIN N) 100 UNIT/ML injection Inject 30-50 Units into the skin 2 (two) times daily. Takes 50 units in the morning and 30 units at night      . insulin regular (NOVOLIN R,HUMULIN R) 100 units/mL injection Inject 30-40 Units into the skin 3 (three) times daily before meals. Takes 40 units in the morning; Takes 30 units midday;  Takes 40 units at night      . multivitamin (RENA-VIT) TABS tablet Take 1 tablet by mouth daily.        . sevelamer (RENVELA) 800 MG tablet Take 1,600 mg by mouth 3 (three) times daily with meals.      . simvastatin (ZOCOR) 20 MG tablet Take 20 mg by mouth at bedtime.            PHYSICAL EXAMINATION:   Vital signs are BP 211/80  Pulse 73  Temp(Src) 99.1 F (37.3 C) (Oral)  Resp 20  Ht 5\' 9"  (1.753 m)  Wt 255 lb (115.667 kg)  BMI 37.64 kg/m2  SpO2 98% General: The patient appears their stated age. HEENT:  No gross abnormalities Pulmonary:  Non labored breathing Musculoskeletal: There are no major deformities. Neurologic: No focal weakness or paresthesias are detected, Skin: There are no ulcer or rashes noted. Psychiatric: The patient has normal affect. Cardiovascular: There is a regular rate and rhythm without significant murmur appreciated. Occluded right arm fistula   Diagnostic Studies I have reviewed his most recent fistulogram. This shows  diffuse stenosis within the proximal portion of the cephalic vein and stents.  Assessment: End-stage renal disease with thrombosed fistula Plan: After reviewing the patient's fistulogram from several months ago, I do not feel that he is a candidate for revision. Therefore I will place a catheter and plan new access in the immediate future. This was discussed with the patient and he agrees  V. Leia Alf, M.D. [facility] [place] Office: [phone] Pager:  [phone]

## 2013-01-13 NOTE — Op Note (Signed)
NAME: [name]   MRN: [id number] DOB: 1946/01/03    DATE OF OPERATION: 01/13/2013  PREOP DIAGNOSIS: Chronic kidney disease  POSTOP DIAGNOSIS: Same  PROCEDURE: new left forearm AV graft  SURGEON: [name]. Scot Dock, MD, FACS  ASSIST: Liana Crocker PA  ANESTHESIA: Gen  EBL: minimal  INDICATIONS: [name] is a 67 y.o. male who presents for new access. He said upper arm and forearm fistulas on both sides and basilic veins were not adequate for a basilic vein transposition. Therefore elected to place a left forearm AV graft.  FINDINGS: 4 mm brachial vein. 3 a half millimeter brachial artery.  TECHNIQUE: Patient was taken to the operating room and and received a general anesthetic. The left upper extremity was prepped and draped in usual sterile fashion. A longitudinal incision was made just above the antecubital level. The brachial artery and brachial vein here were dissected free. In order to allow better exposure distally I performed a z-plasty through the antecubital fossa. Using one distal counterincision, a 4-7 mm tapered PTFE graft was tunneled in a loop fashion in the forearm with the arterial aspect of the graft along the radial aspect of the forearm. The patient was then heparinized. The brachial artery was clamped proximally and distally and a longitudinal arteriotomy was made. A segment of the 4 mm end of the graft was excised the graft was spatulated and sewn end to side to the brachial artery using continuous 6-0 Prolene suture. Graft and poor the appropriate length for anastomosis to the brachial vein. The vein was ligated distally and spatulated proximally. The graft cut appropriately, spatulated, and sewn end-to-end to the vein using continuous 6-0 Prolene suture. At the completion was an excellent thrill in the graft. There was a good radial and ulnar signals with Doppler. The distal counterincision was closed the deep layer 3-0 Vicryl and the skin closed with 4-0  Vicryl. Antecubital incision was closed with a deep layer of 3-0 Vicryl and the skin closed with 4-0 Vicryl. All needle and sponge counts were correct. Patient tolerated the procedure well and was transferred to the recovery room in stable condition.  Deitra Mayo, MD, FACS [facility]  DATE OF DICTATION:   01/13/2013

## 2013-01-13 NOTE — Interval H&P Note (Signed)
History and Physical Interval Note:  01/13/2013 9:04 AM  Ernest [name]  has presented today for surgery, with the diagnosis of ESRD  The various methods of treatment have been discussed with the patient and family. After consideration of risks, benefits and other options for treatment, the patient has consented to  Procedure(s) with comments: [place] (Left) - OR INSERTION OF GRAFT as a surgical intervention .  The patient's history has been reviewed, patient examined, no change in status, stable for surgery.  I have reviewed the patient's chart and labs.  Questions were answered to the patient's satisfaction.     Leasia Swann S

## 2013-01-13 NOTE — Anesthesia Postprocedure Evaluation (Signed)
Anesthesia Post Note  Patient: Ernest [name]  Procedure(s) Performed: Procedure(s) (LRB): INSERTION OF ARTERIOVENOUS (AV) GORE-TEX GRAFT ARM (Left)  Anesthesia type: General  Patient location: PACU  Post pain: Pain level controlled and Adequate analgesia  Post assessment: Post-op Vital signs reviewed, Patient's Cardiovascular Status Stable, Respiratory Function Stable, Patent Airway and Pain level controlled  Last Vitals:  Filed Vitals:   01/13/13 1118  BP: 135/64  Pulse: 103  Temp:   Resp: 10    Post vital signs: Reviewed and stable  Level of consciousness: awake, alert  and oriented  Complications: No apparent anesthesia complications

## 2013-01-13 NOTE — Anesthesia Preprocedure Evaluation (Signed)
Anesthesia Evaluation  Patient identified by MRN, date of birth, ID band Patient awake    Reviewed: Allergy & Precautions, H&P , NPO status , Patient's Chart, lab work & pertinent test results  Airway Mallampati: II  Neck ROM: full    Dental   Pulmonary shortness of breath,          Cardiovascular hypertension, + CAD     Neuro/Psych    GI/Hepatic (+) Hepatitis -, C  Endo/Other  diabetes, Type 2Morbid obesity  Renal/GU ESRF and DialysisRenal disease     Musculoskeletal   Abdominal   Peds  Hematology   Anesthesia Other Findings   Reproductive/Obstetrics                           Anesthesia Physical Anesthesia Plan  ASA: III  Anesthesia Plan: General   Post-op Pain Management:    Induction: Intravenous  Airway Management Planned: LMA  Additional Equipment:   Intra-op Plan:   Post-operative Plan: Extubation in OR  Informed Consent: I have reviewed the patients History and Physical, chart, labs and discussed the procedure including the risks, benefits and alternatives for the proposed anesthesia with the patient or authorized representative who has indicated his/her understanding and acceptance.     Plan Discussed with: CRNA and Surgeon  Anesthesia Plan Comments:         Anesthesia Quick Evaluation

## 2013-01-14 ENCOUNTER — Encounter (HOSPITAL_COMMUNITY): Payer: Self-pay | Admitting: Vascular Surgery

## 2013-01-14 DIAGNOSIS — D509 Iron deficiency anemia, unspecified: Secondary | ICD-10-CM | POA: Diagnosis not present

## 2013-01-14 DIAGNOSIS — N039 Chronic nephritic syndrome with unspecified morphologic changes: Secondary | ICD-10-CM | POA: Diagnosis not present

## 2013-01-14 DIAGNOSIS — N186 End stage renal disease: Secondary | ICD-10-CM | POA: Diagnosis not present

## 2013-01-14 DIAGNOSIS — N2581 Secondary hyperparathyroidism of renal origin: Secondary | ICD-10-CM | POA: Diagnosis not present

## 2013-01-16 DIAGNOSIS — D509 Iron deficiency anemia, unspecified: Secondary | ICD-10-CM | POA: Diagnosis not present

## 2013-01-16 DIAGNOSIS — D631 Anemia in chronic kidney disease: Secondary | ICD-10-CM | POA: Diagnosis not present

## 2013-01-16 DIAGNOSIS — N186 End stage renal disease: Secondary | ICD-10-CM | POA: Diagnosis not present

## 2013-01-16 DIAGNOSIS — N2581 Secondary hyperparathyroidism of renal origin: Secondary | ICD-10-CM | POA: Diagnosis not present

## 2013-01-19 DIAGNOSIS — D509 Iron deficiency anemia, unspecified: Secondary | ICD-10-CM | POA: Diagnosis not present

## 2013-01-19 DIAGNOSIS — N186 End stage renal disease: Secondary | ICD-10-CM | POA: Diagnosis not present

## 2013-01-19 DIAGNOSIS — N039 Chronic nephritic syndrome with unspecified morphologic changes: Secondary | ICD-10-CM | POA: Diagnosis not present

## 2013-01-19 DIAGNOSIS — N2581 Secondary hyperparathyroidism of renal origin: Secondary | ICD-10-CM | POA: Diagnosis not present

## 2013-01-21 DIAGNOSIS — D509 Iron deficiency anemia, unspecified: Secondary | ICD-10-CM | POA: Diagnosis not present

## 2013-01-21 DIAGNOSIS — N186 End stage renal disease: Secondary | ICD-10-CM | POA: Diagnosis not present

## 2013-01-21 DIAGNOSIS — N2581 Secondary hyperparathyroidism of renal origin: Secondary | ICD-10-CM | POA: Diagnosis not present

## 2013-01-21 DIAGNOSIS — D631 Anemia in chronic kidney disease: Secondary | ICD-10-CM | POA: Diagnosis not present

## 2013-01-23 DIAGNOSIS — N2581 Secondary hyperparathyroidism of renal origin: Secondary | ICD-10-CM | POA: Diagnosis not present

## 2013-01-23 DIAGNOSIS — D509 Iron deficiency anemia, unspecified: Secondary | ICD-10-CM | POA: Diagnosis not present

## 2013-01-23 DIAGNOSIS — N039 Chronic nephritic syndrome with unspecified morphologic changes: Secondary | ICD-10-CM | POA: Diagnosis not present

## 2013-01-23 DIAGNOSIS — N186 End stage renal disease: Secondary | ICD-10-CM | POA: Diagnosis not present

## 2013-01-26 DIAGNOSIS — D631 Anemia in chronic kidney disease: Secondary | ICD-10-CM | POA: Diagnosis not present

## 2013-01-26 DIAGNOSIS — D509 Iron deficiency anemia, unspecified: Secondary | ICD-10-CM | POA: Diagnosis not present

## 2013-01-26 DIAGNOSIS — N186 End stage renal disease: Secondary | ICD-10-CM | POA: Diagnosis not present

## 2013-01-26 DIAGNOSIS — N2581 Secondary hyperparathyroidism of renal origin: Secondary | ICD-10-CM | POA: Diagnosis not present

## 2013-01-28 DIAGNOSIS — D631 Anemia in chronic kidney disease: Secondary | ICD-10-CM | POA: Diagnosis not present

## 2013-01-28 DIAGNOSIS — N2581 Secondary hyperparathyroidism of renal origin: Secondary | ICD-10-CM | POA: Diagnosis not present

## 2013-01-28 DIAGNOSIS — D509 Iron deficiency anemia, unspecified: Secondary | ICD-10-CM | POA: Diagnosis not present

## 2013-01-28 DIAGNOSIS — N186 End stage renal disease: Secondary | ICD-10-CM | POA: Diagnosis not present

## 2013-01-30 DIAGNOSIS — N2581 Secondary hyperparathyroidism of renal origin: Secondary | ICD-10-CM | POA: Diagnosis not present

## 2013-01-30 DIAGNOSIS — N186 End stage renal disease: Secondary | ICD-10-CM | POA: Diagnosis not present

## 2013-01-30 DIAGNOSIS — D509 Iron deficiency anemia, unspecified: Secondary | ICD-10-CM | POA: Diagnosis not present

## 2013-01-30 DIAGNOSIS — N039 Chronic nephritic syndrome with unspecified morphologic changes: Secondary | ICD-10-CM | POA: Diagnosis not present

## 2013-02-02 DIAGNOSIS — N186 End stage renal disease: Secondary | ICD-10-CM | POA: Diagnosis not present

## 2013-02-02 DIAGNOSIS — N039 Chronic nephritic syndrome with unspecified morphologic changes: Secondary | ICD-10-CM | POA: Diagnosis not present

## 2013-02-02 DIAGNOSIS — N2581 Secondary hyperparathyroidism of renal origin: Secondary | ICD-10-CM | POA: Diagnosis not present

## 2013-02-02 DIAGNOSIS — D509 Iron deficiency anemia, unspecified: Secondary | ICD-10-CM | POA: Diagnosis not present

## 2013-02-04 DIAGNOSIS — E1129 Type 2 diabetes mellitus with other diabetic kidney complication: Secondary | ICD-10-CM | POA: Diagnosis not present

## 2013-02-04 DIAGNOSIS — D631 Anemia in chronic kidney disease: Secondary | ICD-10-CM | POA: Diagnosis not present

## 2013-02-04 DIAGNOSIS — N186 End stage renal disease: Secondary | ICD-10-CM | POA: Diagnosis not present

## 2013-02-04 DIAGNOSIS — N2581 Secondary hyperparathyroidism of renal origin: Secondary | ICD-10-CM | POA: Diagnosis not present

## 2013-02-04 DIAGNOSIS — D509 Iron deficiency anemia, unspecified: Secondary | ICD-10-CM | POA: Diagnosis not present

## 2013-02-04 NOTE — Addendum Note (Signed)
Addendum created 02/04/13 1152 by Sydnee Levans, MD   Modules edited: Anesthesia Events

## 2013-02-06 DIAGNOSIS — N2581 Secondary hyperparathyroidism of renal origin: Secondary | ICD-10-CM | POA: Diagnosis not present

## 2013-02-06 DIAGNOSIS — D631 Anemia in chronic kidney disease: Secondary | ICD-10-CM | POA: Diagnosis not present

## 2013-02-06 DIAGNOSIS — D509 Iron deficiency anemia, unspecified: Secondary | ICD-10-CM | POA: Diagnosis not present

## 2013-02-06 DIAGNOSIS — N186 End stage renal disease: Secondary | ICD-10-CM | POA: Diagnosis not present

## 2013-02-09 DIAGNOSIS — N2581 Secondary hyperparathyroidism of renal origin: Secondary | ICD-10-CM | POA: Diagnosis not present

## 2013-02-09 DIAGNOSIS — D509 Iron deficiency anemia, unspecified: Secondary | ICD-10-CM | POA: Diagnosis not present

## 2013-02-09 DIAGNOSIS — N186 End stage renal disease: Secondary | ICD-10-CM | POA: Diagnosis not present

## 2013-02-09 DIAGNOSIS — N039 Chronic nephritic syndrome with unspecified morphologic changes: Secondary | ICD-10-CM | POA: Diagnosis not present

## 2013-02-11 DIAGNOSIS — N186 End stage renal disease: Secondary | ICD-10-CM | POA: Diagnosis not present

## 2013-02-11 DIAGNOSIS — D631 Anemia in chronic kidney disease: Secondary | ICD-10-CM | POA: Diagnosis not present

## 2013-02-11 DIAGNOSIS — N2581 Secondary hyperparathyroidism of renal origin: Secondary | ICD-10-CM | POA: Diagnosis not present

## 2013-02-11 DIAGNOSIS — D509 Iron deficiency anemia, unspecified: Secondary | ICD-10-CM | POA: Diagnosis not present

## 2013-02-13 DIAGNOSIS — D509 Iron deficiency anemia, unspecified: Secondary | ICD-10-CM | POA: Diagnosis not present

## 2013-02-13 DIAGNOSIS — D631 Anemia in chronic kidney disease: Secondary | ICD-10-CM | POA: Diagnosis not present

## 2013-02-13 DIAGNOSIS — N186 End stage renal disease: Secondary | ICD-10-CM | POA: Diagnosis not present

## 2013-02-13 DIAGNOSIS — N2581 Secondary hyperparathyroidism of renal origin: Secondary | ICD-10-CM | POA: Diagnosis not present

## 2013-02-27 ENCOUNTER — Other Ambulatory Visit (HOSPITAL_COMMUNITY): Payer: Self-pay | Admitting: Nephrology

## 2013-02-27 DIAGNOSIS — N186 End stage renal disease: Secondary | ICD-10-CM

## 2013-03-01 ENCOUNTER — Encounter (HOSPITAL_COMMUNITY): Payer: Self-pay | Admitting: Family Medicine

## 2013-03-01 ENCOUNTER — Emergency Department (HOSPITAL_COMMUNITY): Payer: Medicare Other

## 2013-03-01 ENCOUNTER — Emergency Department (HOSPITAL_COMMUNITY)
Admission: EM | Admit: 2013-03-01 | Discharge: 2013-03-01 | Disposition: A | Discharge status: Home / Self Care | Payer: Medicare Other | Attending: Emergency Medicine | Admitting: Emergency Medicine

## 2013-03-01 DIAGNOSIS — E119 Type 2 diabetes mellitus without complications: Secondary | ICD-10-CM | POA: Insufficient documentation

## 2013-03-01 DIAGNOSIS — Z87891 Personal history of nicotine dependence: Secondary | ICD-10-CM | POA: Insufficient documentation

## 2013-03-01 DIAGNOSIS — R05 Cough: Secondary | ICD-10-CM | POA: Diagnosis not present

## 2013-03-01 DIAGNOSIS — E785 Hyperlipidemia, unspecified: Secondary | ICD-10-CM | POA: Insufficient documentation

## 2013-03-01 DIAGNOSIS — Z7982 Long term (current) use of aspirin: Secondary | ICD-10-CM | POA: Diagnosis not present

## 2013-03-01 DIAGNOSIS — N186 End stage renal disease: Secondary | ICD-10-CM | POA: Diagnosis not present

## 2013-03-01 DIAGNOSIS — Z8669 Personal history of other diseases of the nervous system and sense organs: Secondary | ICD-10-CM | POA: Diagnosis not present

## 2013-03-01 DIAGNOSIS — Z79899 Other long term (current) drug therapy: Secondary | ICD-10-CM | POA: Insufficient documentation

## 2013-03-01 DIAGNOSIS — Z862 Personal history of diseases of the blood and blood-forming organs and certain disorders involving the immune mechanism: Secondary | ICD-10-CM | POA: Insufficient documentation

## 2013-03-01 DIAGNOSIS — I120 Hypertensive chronic kidney disease with stage 5 chronic kidney disease or end stage renal disease: Secondary | ICD-10-CM | POA: Diagnosis not present

## 2013-03-01 DIAGNOSIS — R059 Cough, unspecified: Secondary | ICD-10-CM | POA: Insufficient documentation

## 2013-03-01 DIAGNOSIS — Z7901 Long term (current) use of anticoagulants: Secondary | ICD-10-CM | POA: Insufficient documentation

## 2013-03-01 DIAGNOSIS — R0989 Other specified symptoms and signs involving the circulatory and respiratory systems: Secondary | ICD-10-CM | POA: Diagnosis not present

## 2013-03-01 DIAGNOSIS — Z794 Long term (current) use of insulin: Secondary | ICD-10-CM | POA: Insufficient documentation

## 2013-03-01 DIAGNOSIS — Z8639 Personal history of other endocrine, nutritional and metabolic disease: Secondary | ICD-10-CM | POA: Insufficient documentation

## 2013-03-01 DIAGNOSIS — Z8619 Personal history of other infectious and parasitic diseases: Secondary | ICD-10-CM | POA: Insufficient documentation

## 2013-03-01 DIAGNOSIS — J4489 Other specified chronic obstructive pulmonary disease: Secondary | ICD-10-CM | POA: Insufficient documentation

## 2013-03-01 DIAGNOSIS — J449 Chronic obstructive pulmonary disease, unspecified: Secondary | ICD-10-CM | POA: Insufficient documentation

## 2013-03-01 HISTORY — DX: Chronic obstructive pulmonary disease, unspecified: J44.9

## 2013-03-01 LAB — CBC WITH DIFFERENTIAL/PLATELET
Basophils Absolute: 0 10*3/uL (ref 0.0–0.1)
Basophils Relative: 0 % (ref 0–1)
Eosinophils Relative: 2 % (ref 0–5)
HCT: 35.2 % — ABNORMAL LOW (ref 39.0–52.0)
MCHC: 34.4 g/dL (ref 30.0–36.0)
MCV: 89.8 fL (ref 78.0–100.0)
Monocytes Absolute: 0.9 10*3/uL (ref 0.1–1.0)
Neutro Abs: 8.2 10*3/uL — ABNORMAL HIGH (ref 1.7–7.7)
RDW: 14.6 % (ref 11.5–15.5)

## 2013-03-01 LAB — COMPREHENSIVE METABOLIC PANEL
AST: 20 U/L (ref 0–37)
Albumin: 3.4 g/dL — ABNORMAL LOW (ref 3.5–5.2)
Calcium: 10.9 mg/dL — ABNORMAL HIGH (ref 8.4–10.5)
Creatinine, Ser: 10.89 mg/dL — ABNORMAL HIGH (ref 0.50–1.35)

## 2013-03-01 LAB — POCT I-STAT TROPONIN I

## 2013-03-01 MED ORDER — ALBUTEROL SULFATE HFA 108 (90 BASE) MCG/ACT IN AERS
2.0000 | INHALATION_SPRAY | RESPIRATORY_TRACT | Status: DC | PRN
  Filled 2013-03-01: qty 6.7

## 2013-03-01 MED ORDER — AEROCHAMBER Z-STAT PLUS/MEDIUM MISC: 1.0000 | Freq: Once | Status: DC

## 2013-03-01 MED ORDER — ALBUTEROL SULFATE (5 MG/ML) 0.5% IN NEBU
5.0000 mg | INHALATION_SOLUTION | Freq: Once | RESPIRATORY_TRACT | Status: AC
  Administered 2013-03-01: 5 mg via RESPIRATORY_TRACT
  Filled 2013-03-01: qty 1

## 2013-03-01 NOTE — ED Notes (Addendum)
Per pt he has had cold symptoms and cough x 8 months. sts he has been coughing up stuff. Pt dialysis pt. sts his doctor gave home some penicillin but he is not better.

## 2013-03-01 NOTE — ED Provider Notes (Signed)
History     CSN: [id number]  Arrival date & time 03/01/13  1144   First MD Initiated Contact with Patient 03/01/13 1334      Chief Complaint  Patient presents with  . Cough    (Consider location/radiation/quality/duration/timing/severity/associated sxs/prior treatment) HPI Comments: Patient with history of end-stage renal disease on dialysis Monday, Wednesday, Friday presents with complaint of cough that he has had for approximately the past 8 months. Cough is mildly productive at times. It is not associated with fever or shortness of breath, chest pain. Patient states that the cough is stable and has not worsened however he thought he needed to get it checked out today. Last dialysis was 2 days ago and was normal in duration. Patient does not think that he is retaining fluid. He has not had lower extremity edema or shortness of breath. Patient denies history of asthma or COPD. He denies nasal congestion runny nose, seasonal allergies. He denies significant heartburn or GERD. Patient is on lisinopril but cannot remember when he started taking it. His medications are managed by his nephrologist. Onset of symptoms gradual. Course is intermittent. Nothing makes symptoms better or worse.  The history is provided by the patient.    Past Medical History  Diagnosis Date  . Abnormal electrocardiogram   . Hyperlipidemia   . Retinopathy   . Gout   . ESRD (end stage renal disease)     on hemodialysis Mon, Wed, Fri - followed by [name]  . Type II or unspecified type diabetes mellitus without mention of complication, not stated as uncontrolled     adult onset  . Erectile dysfunction     penile implant  . Abnormal stress test     s/p cath November 2013 with modest disease involving the ostial left main, proximal LAD, proximal RCA - do not appear to be hemodynamically signficant; mild LV dysfunction  . Obesity   . Shortness of breath     with exertion  . Hypertension   . Hepatitis C    . COPD (chronic obstructive pulmonary disease)     Past Surgical History  Procedure Laterality Date  . Penile prosthesis implant    . Fistula      RUE and wrist  . Cardiac catheterization  August 26, 2012  . Eye surgery      laser.  and surgery for DM  . Insertion of dialysis catheter Right 01/01/2013    Procedure: INSERTION OF DIALYSIS CATHETER right  internal jugular;  Surgeon: Serafina Mitchell, MD;  Location: [place];  Service: Vascular;  Laterality: Right;  . Av fistula placement Left 01/13/2013    Procedure: INSERTION OF ARTERIOVENOUS (AV) GORE-TEX GRAFT ARM;  Surgeon: Angelia Mould, MD;  Location: [facility] OR;  Service: Vascular;  Laterality: Left;    Family History  Problem Relation Age of Onset  . Heart disease Father     History  Substance Use Topics  . Smoking status: Former Smoker -- 7 years    Quit date: 06/10/1973  . Smokeless tobacco: Never Used  . Alcohol Use: No      Review of Systems  Constitutional: Negative for fever.  HENT: Negative for sore throat and rhinorrhea.   Eyes: Negative for redness.  Respiratory: Positive for cough.   Cardiovascular: Negative for chest pain, palpitations and leg swelling.  Gastrointestinal: Negative for nausea, vomiting, abdominal pain and diarrhea.  Genitourinary: Negative for dysuria.  Musculoskeletal: Negative for myalgias.  Skin: Negative for rash.  Neurological: Negative for  headaches.    Allergies  Review of patient's allergies indicates no known allergies.  Home Medications   Current Outpatient Rx  Name  Route  Sig  Dispense  Refill  . allopurinol (ZYLOPRIM) 100 MG tablet   Oral   Take 100 mg by mouth daily.          [name] amitriptyline (ELAVIL) 25 MG tablet   Oral   Take 25 mg by mouth at bedtime.         [name] aspirin 325 MG tablet   Oral   Take 325 mg by mouth daily.           . Calcium Acetate 667 MG TABS   Oral   Take 2-3 tablets by mouth 3 (three) times daily with meals. Takes 3 capsules with  meals and 2 capsules with snacks         . cinacalcet (SENSIPAR) 60 MG tablet   Oral   Take 60 mg by mouth daily.         . clopidogrel (PLAVIX) 75 MG tablet   Oral   Take 75 mg by mouth daily.           . colchicine 0.6 MG tablet   Oral   Take 0.6 mg by mouth daily.          . insulin NPH (HUMULIN N,NOVOLIN N) 100 UNIT/ML injection   Subcutaneous   Inject 30-50 Units into the skin 2 (two) times daily. Takes 50 units in the morning and 30 units at night         . insulin regular (NOVOLIN R,HUMULIN R) 100 units/mL injection   Subcutaneous   Inject 30-40 Units into the skin 3 (three) times daily before meals. Takes 40 units in the morning; Takes 30 units midday; Takes 40 units at night         . lidocaine-prilocaine (EMLA) cream   Topical   Apply 1 application topically 3 (three) times a week. Prior to dialysis         . lisinopril (PRINIVIL,ZESTRIL) 10 MG tablet   Oral   Take 10 mg by mouth daily.         . metoprolol (LOPRESSOR) 50 MG tablet   Oral   Take 50 mg by mouth daily.         . multivitamin (RENA-VIT) TABS tablet   Oral   Take 1 tablet by mouth daily.           [name] oxyCODONE (OXY IR/ROXICODONE) 5 MG immediate release tablet   Oral   Take 1 tablet (5 mg total) by mouth once as needed.   30 tablet   0   . sevelamer (RENVELA) 800 MG tablet   Oral   Take 1,600 mg by mouth 3 (three) times daily with meals.         . simvastatin (ZOCOR) 20 MG tablet   Oral   Take 20 mg by mouth at bedtime.             BP 166/62  Pulse 70  Temp(Src) 97.7 F (36.5 C) (Oral)  Resp 18  SpO2 96%  Physical Exam  Nursing note and vitals reviewed. Constitutional: He appears well-developed and well-nourished.  HENT:  Head: Normocephalic and atraumatic.  Eyes: Conjunctivae are normal. Right eye exhibits no discharge. Left eye exhibits no discharge.  Neck: Normal range of motion. Neck supple.  Cardiovascular: Normal rate, regular rhythm and normal  heart sounds.   Pulmonary/Chest: Effort normal and  breath sounds normal. No respiratory distress. He has no wheezes. He has no rales.  Catheter in place R chest.   Abdominal: Soft. There is no tenderness. There is no rebound and no guarding.  Musculoskeletal:  Fistula with palpable thrill L forearm.   Neurological: He is alert.  Skin: Skin is warm and dry.  Psychiatric: He has a normal mood and affect.    ED Course  Procedures (including critical care time)  Labs Reviewed  CBC WITH DIFFERENTIAL - Abnormal; Notable for the following:    WBC 12.0 (*)    RBC 3.92 (*)    Hemoglobin 12.1 (*)    HCT 35.2 (*)    Neutro Abs 8.2 (*)    All other components within normal limits  COMPREHENSIVE METABOLIC PANEL - Abnormal; Notable for the following:    Glucose, Bld 149 (*)    BUN 58 (*)    Creatinine, Ser 10.89 (*)    Calcium 10.9 (*)    Albumin 3.4 (*)    Total Bilirubin 0.2 (*)    GFR calc non Af Amer 4 (*)    GFR calc Af Amer 5 (*)    All other components within normal limits  POCT I-STAT TROPONIN I   Dg Chest 2 View  03/01/2013   *RADIOLOGY REPORT*  Clinical Data: Productive cough for 8 months  CHEST - 2 VIEW  Comparison: Chest radiograph 01/01/2013 and 03/08/2012  Findings: Right IJ dialysis catheter has its leads projecting over the mid and distal superior vena cava.  Borderline cardiomegaly. Mediastinal and hilar contours are normal.  There is mild pulmonary vascular congestion without definite edema.  Mild lingular atelectasis noted.  Negative for pleural effusion.  A short vascular graft is seen in the expected region of the subclavian vessels on the right.  The bones and visualized upper abdomen are unremarkable.  IMPRESSION:  1.  Satisfactory position of right IJ dialysis catheter. 2.  Borderline cardiomegaly and mild pulmonary vascular congestion.   Original Report Authenticated By: Curlene Dolphin, M.D.     1. Cough     1:43 PM Patient seen and examined. Work-up initiated.  Medications ordered.   Vital signs reviewed and are as follows: Filed Vitals:   03/01/13 1148  BP: 166/62  Pulse: 70  Temp: 97.7 F (36.5 C)  Resp: 18   Patient d/w and seen by Dr. Darl Householder. Will await labs and give breathing treatment.   3:53 PM Labs consistent with his ESRD. Patient s/p breathing treatment. He agrees to speak with nephrologist tomorrow.   Urged return with worsening symptoms, worsening SOB, fever.    MDM  Chronic cough x months, vitals normal with normal O2 sat. CXR shows mild pulmonary congestion but no overt edema. This is expected with dialysis pending tomorrow. No PNA. Will attempt symptomatic treatment with albuterol inhaler. ACEi cough considered, other causes of chronic cough (GERD, PND, bronchospasm) however these will really need to be evaluated by PCP. No emergent conditions suspected that would indicate admission.         Carlisle Cater, [name] 03/01/13 1558

## 2013-03-01 NOTE — ED Notes (Signed)
Pt discharged to home with family. NAD.  

## 2013-03-01 NOTE — ED Notes (Signed)
Pt alert and mentating appropriately. Pt speaking in clear sentences. Pt states that shortness of breath and cough increase when he is laying down. Pt denies pain. Pt denies n/v/d. Pt states he sometimes "gets sick at night." NAD noted at this time.

## 2013-03-01 NOTE — ED Notes (Signed)
Attempted to start IV on right hand. Notified phlebotomy of difficult stick and verified that pt has restricted left arm due to fistula

## 2013-03-01 NOTE — ED Notes (Signed)
IV attempted by 2 nurses, 4 attempts total. IV team called.

## 2013-03-03 ENCOUNTER — Ambulatory Visit (HOSPITAL_COMMUNITY)
Admission: RE | Admit: 2013-03-03 | Discharge: 2013-03-03 | Disposition: A | Discharge status: Home / Self Care | Payer: Medicare Other | Source: Ambulatory Visit | Attending: Nephrology | Admitting: Nephrology

## 2013-03-03 VITALS — BP 213/55 | HR 64 | Resp 16

## 2013-03-03 DIAGNOSIS — N186 End stage renal disease: Secondary | ICD-10-CM | POA: Diagnosis not present

## 2013-03-03 DIAGNOSIS — Z452 Encounter for adjustment and management of vascular access device: Secondary | ICD-10-CM | POA: Insufficient documentation

## 2013-03-03 MED ORDER — CHLORHEXIDINE GLUCONATE 4 % EX LIQD
CUTANEOUS | Status: AC
  Filled 2013-03-03: qty 30

## 2013-03-03 NOTE — Progress Notes (Signed)
Pt had HD cath removal in IR; dsg clean, dry, intact

## 2013-03-03 NOTE — Procedures (Signed)
Right IJ tunneled HD catheter removed in its entirety without immediate complications. Medications utilized- 1% lidocaine to skin and SQ tissue.  Vaseline gauze dressing applied to site.

## 2013-03-04 ENCOUNTER — Telehealth (HOSPITAL_COMMUNITY): Payer: Self-pay | Admitting: *Deleted

## 2013-03-04 NOTE — ED Provider Notes (Signed)
Medical screening examination/treatment/procedure(s) were conducted as a shared visit with non-physician practitioner(s) and myself.  I personally evaluated the patient during the encounter  Ernest [name] is a 67 y.o. male dialysis patient (last dialyzed 2 days ago), here with cough. Cough for 2 months. No chest pain or SOB. Wants to get checked out today. He doesn't appear fluid overloaded or SOB. Vitals stable. CXR showed no pneumonia, ? Mild wheezing on exam. Labs stable and K nl. He is d/c home on albuterol prn for bronchitis.    Wandra Arthurs, MD 03/04/13 [id number]

## 2013-03-10 DIAGNOSIS — J309 Allergic rhinitis, unspecified: Secondary | ICD-10-CM | POA: Diagnosis not present

## 2013-03-14 DIAGNOSIS — N186 End stage renal disease: Secondary | ICD-10-CM | POA: Diagnosis not present

## 2013-03-16 DIAGNOSIS — D631 Anemia in chronic kidney disease: Secondary | ICD-10-CM | POA: Diagnosis not present

## 2013-03-16 DIAGNOSIS — N186 End stage renal disease: Secondary | ICD-10-CM | POA: Diagnosis not present

## 2013-03-16 DIAGNOSIS — N2581 Secondary hyperparathyroidism of renal origin: Secondary | ICD-10-CM | POA: Diagnosis not present

## 2013-03-16 DIAGNOSIS — D509 Iron deficiency anemia, unspecified: Secondary | ICD-10-CM | POA: Diagnosis not present

## 2013-03-23 ENCOUNTER — Other Ambulatory Visit (HOSPITAL_COMMUNITY): Payer: Self-pay | Admitting: Nephrology

## 2013-03-23 DIAGNOSIS — N186 End stage renal disease: Secondary | ICD-10-CM

## 2013-03-25 ENCOUNTER — Ambulatory Visit (HOSPITAL_COMMUNITY): Admission: RE | Admit: 2013-03-25 | Payer: Medicare Other | Source: Ambulatory Visit

## 2013-03-25 ENCOUNTER — Other Ambulatory Visit: Payer: Self-pay | Admitting: *Deleted

## 2013-03-25 ENCOUNTER — Encounter (HOSPITAL_COMMUNITY): Payer: Self-pay | Admitting: *Deleted

## 2013-03-25 DIAGNOSIS — I871 Compression of vein: Secondary | ICD-10-CM | POA: Diagnosis not present

## 2013-03-25 DIAGNOSIS — N186 End stage renal disease: Secondary | ICD-10-CM | POA: Diagnosis not present

## 2013-03-25 DIAGNOSIS — T82898A Other specified complication of vascular prosthetic devices, implants and grafts, initial encounter: Secondary | ICD-10-CM | POA: Diagnosis not present

## 2013-03-25 MED ORDER — SODIUM CHLORIDE 0.9 % IV SOLN: INTRAVENOUS | Status: DC

## 2013-03-25 MED ORDER — DEXTROSE 5 % IV SOLN
1.5000 g | INTRAVENOUS | Status: AC
  Administered 2013-03-26: 1.5 g via INTRAVENOUS
  Filled 2013-03-25: qty 1.5

## 2013-03-25 NOTE — Progress Notes (Signed)
03/25/13 1532  OBSTRUCTIVE SLEEP APNEA  Have you ever been diagnosed with sleep apnea through a sleep study? No  Do you snore loudly (loud enough to be heard through closed doors)?  0  Do you often feel tired, fatigued, or sleepy during the daytime? 1  Do you have, or are you being treated for high blood pressure? 1  BMI more than 35 kg/m2? 1  Age over 65 years old? 1  Neck circumference greater than 40 cm/18 inches? 1 (22 per patient)  Gender: 1  Obstructive Sleep Apnea Score 6

## 2013-03-26 ENCOUNTER — Ambulatory Visit (HOSPITAL_COMMUNITY): Payer: Medicare Other

## 2013-03-26 ENCOUNTER — Encounter (HOSPITAL_COMMUNITY): Payer: Self-pay | Admitting: Certified Registered Nurse Anesthetist

## 2013-03-26 ENCOUNTER — Telehealth (HOSPITAL_COMMUNITY): Payer: Self-pay | Admitting: *Deleted

## 2013-03-26 ENCOUNTER — Ambulatory Visit (HOSPITAL_COMMUNITY)
Admission: RE | Admit: 2013-03-26 | Discharge: 2013-03-26 | Disposition: A | Discharge status: Home / Self Care | Payer: Medicare Other | Source: Ambulatory Visit | Attending: Vascular Surgery | Admitting: Vascular Surgery

## 2013-03-26 ENCOUNTER — Ambulatory Visit (HOSPITAL_COMMUNITY): Payer: Medicare Other | Admitting: Certified Registered Nurse Anesthetist

## 2013-03-26 ENCOUNTER — Encounter (HOSPITAL_COMMUNITY)
Admission: RE | Disposition: A | Discharge status: Home / Self Care | Payer: Self-pay | Source: Ambulatory Visit | Attending: Vascular Surgery

## 2013-03-26 DIAGNOSIS — Z794 Long term (current) use of insulin: Secondary | ICD-10-CM | POA: Diagnosis not present

## 2013-03-26 DIAGNOSIS — N186 End stage renal disease: Secondary | ICD-10-CM

## 2013-03-26 DIAGNOSIS — J449 Chronic obstructive pulmonary disease, unspecified: Secondary | ICD-10-CM | POA: Insufficient documentation

## 2013-03-26 DIAGNOSIS — E785 Hyperlipidemia, unspecified: Secondary | ICD-10-CM | POA: Insufficient documentation

## 2013-03-26 DIAGNOSIS — I120 Hypertensive chronic kidney disease with stage 5 chronic kidney disease or end stage renal disease: Secondary | ICD-10-CM | POA: Insufficient documentation

## 2013-03-26 DIAGNOSIS — H3500 Unspecified background retinopathy: Secondary | ICD-10-CM | POA: Insufficient documentation

## 2013-03-26 DIAGNOSIS — I82609 Acute embolism and thrombosis of unspecified veins of unspecified upper extremity: Secondary | ICD-10-CM | POA: Diagnosis not present

## 2013-03-26 DIAGNOSIS — Z79899 Other long term (current) drug therapy: Secondary | ICD-10-CM | POA: Diagnosis not present

## 2013-03-26 DIAGNOSIS — Z87891 Personal history of nicotine dependence: Secondary | ICD-10-CM | POA: Insufficient documentation

## 2013-03-26 DIAGNOSIS — T82898A Other specified complication of vascular prosthetic devices, implants and grafts, initial encounter: Secondary | ICD-10-CM | POA: Insufficient documentation

## 2013-03-26 DIAGNOSIS — Z452 Encounter for adjustment and management of vascular access device: Secondary | ICD-10-CM | POA: Diagnosis not present

## 2013-03-26 DIAGNOSIS — B1920 Unspecified viral hepatitis C without hepatic coma: Secondary | ICD-10-CM | POA: Diagnosis not present

## 2013-03-26 DIAGNOSIS — M109 Gout, unspecified: Secondary | ICD-10-CM | POA: Insufficient documentation

## 2013-03-26 DIAGNOSIS — I2510 Atherosclerotic heart disease of native coronary artery without angina pectoris: Secondary | ICD-10-CM | POA: Diagnosis not present

## 2013-03-26 DIAGNOSIS — E119 Type 2 diabetes mellitus without complications: Secondary | ICD-10-CM | POA: Diagnosis not present

## 2013-03-26 DIAGNOSIS — J9819 Other pulmonary collapse: Secondary | ICD-10-CM | POA: Diagnosis not present

## 2013-03-26 DIAGNOSIS — Y832 Surgical operation with anastomosis, bypass or graft as the cause of abnormal reaction of the patient, or of later complication, without mention of misadventure at the time of the procedure: Secondary | ICD-10-CM | POA: Insufficient documentation

## 2013-03-26 DIAGNOSIS — Z6838 Body mass index (BMI) 38.0-38.9, adult: Secondary | ICD-10-CM | POA: Diagnosis not present

## 2013-03-26 DIAGNOSIS — R0602 Shortness of breath: Secondary | ICD-10-CM | POA: Diagnosis not present

## 2013-03-26 DIAGNOSIS — J4489 Other specified chronic obstructive pulmonary disease: Secondary | ICD-10-CM | POA: Insufficient documentation

## 2013-03-26 DIAGNOSIS — N529 Male erectile dysfunction, unspecified: Secondary | ICD-10-CM | POA: Diagnosis not present

## 2013-03-26 HISTORY — PX: THROMBECTOMY W/ EMBOLECTOMY: SHX2507

## 2013-03-26 HISTORY — PX: AVGG REMOVAL: SHX5153

## 2013-03-26 HISTORY — PX: INSERTION OF DIALYSIS CATHETER: SHX1324

## 2013-03-26 LAB — SURGICAL PCR SCREEN: Staphylococcus aureus: NEGATIVE

## 2013-03-26 LAB — GLUCOSE, CAPILLARY
Glucose-Capillary: 109 mg/dL — ABNORMAL HIGH (ref 70–99)
Glucose-Capillary: 112 mg/dL — ABNORMAL HIGH (ref 70–99)

## 2013-03-26 LAB — POCT I-STAT 4, (NA,K, GLUC, HGB,HCT)
Glucose, Bld: 109 mg/dL — ABNORMAL HIGH (ref 70–99)
HCT: 40 % (ref 39.0–52.0)

## 2013-03-26 SURGERY — THROMBECTOMY ARTERIOVENOUS GORE-TEX GRAFT
Anesthesia: Monitor Anesthesia Care | Site: Arm Upper | Wound class: Clean

## 2013-03-26 MED ORDER — SODIUM CHLORIDE 0.9 % IV SOLN: INTRAVENOUS | Status: DC | PRN

## 2013-03-26 MED ORDER — LIDOCAINE HCL (PF) 1 % IJ SOLN
INTRAMUSCULAR | Status: AC
  Filled 2013-03-26: qty 30

## 2013-03-26 MED ORDER — CEFAZOLIN SODIUM-DEXTROSE 2-3 GM-% IV SOLR
INTRAVENOUS | Status: AC
  Filled 2013-03-26: qty 50

## 2013-03-26 MED ORDER — SODIUM CHLORIDE 0.9 % IR SOLN
Status: DC | PRN
  Administered 2013-03-26: 15:00:00

## 2013-03-26 MED ORDER — MIDAZOLAM HCL 5 MG/5ML IJ SOLN
INTRAMUSCULAR | Status: DC | PRN
  Administered 2013-03-26: 2 mg via INTRAVENOUS

## 2013-03-26 MED ORDER — HEPARIN SODIUM (PORCINE) 1000 UNIT/ML IJ SOLN
INTRAMUSCULAR | Status: DC | PRN
  Administered 2013-03-26: 10 mL via INTRAVENOUS

## 2013-03-26 MED ORDER — LIDOCAINE HCL (PF) 1 % IJ SOLN
INTRAMUSCULAR | Status: DC | PRN
  Administered 2013-03-26: 30 mL

## 2013-03-26 MED ORDER — SODIUM CHLORIDE 0.9 % IV SOLN
INTRAVENOUS | Status: DC | PRN
  Administered 2013-03-26: 15:00:00 via INTRAVENOUS

## 2013-03-26 MED ORDER — THROMBIN 20000 UNITS EX SOLR
CUTANEOUS | Status: AC
  Filled 2013-03-26: qty 20000

## 2013-03-26 MED ORDER — HEPARIN SODIUM (PORCINE) 1000 UNIT/ML IJ SOLN
INTRAMUSCULAR | Status: DC | PRN
  Administered 2013-03-26: 8000 [IU] via INTRAVENOUS

## 2013-03-26 MED ORDER — MUPIROCIN 2 % EX OINT
TOPICAL_OINTMENT | CUTANEOUS | Status: AC
  Administered 2013-03-26: 1
  Filled 2013-03-26: qty 22

## 2013-03-26 MED ORDER — MUPIROCIN 2 % EX OINT
TOPICAL_OINTMENT | Freq: Two times a day (BID) | CUTANEOUS | Status: DC
  Filled 2013-03-26: qty 22

## 2013-03-26 MED ORDER — LIDOCAINE-EPINEPHRINE (PF) 1 %-1:200000 IJ SOLN
INTRAMUSCULAR | Status: DC | PRN
  Administered 2013-03-26: 30 mL

## 2013-03-26 MED ORDER — HEPARIN SODIUM (PORCINE) 1000 UNIT/ML IJ SOLN
INTRAMUSCULAR | Status: AC
  Filled 2013-03-26: qty 1

## 2013-03-26 MED ORDER — 0.9 % SODIUM CHLORIDE (POUR BTL) OPTIME
TOPICAL | Status: DC | PRN
  Administered 2013-03-26: 1000 mL

## 2013-03-26 MED ORDER — FENTANYL CITRATE 0.05 MG/ML IJ SOLN
INTRAMUSCULAR | Status: DC | PRN
  Administered 2013-03-26: 50 ug via INTRAVENOUS

## 2013-03-26 MED ORDER — PROTAMINE SULFATE 10 MG/ML IV SOLN
INTRAVENOUS | Status: DC | PRN
  Administered 2013-03-26 (×5): 10 mg via INTRAVENOUS

## 2013-03-26 MED ORDER — LIDOCAINE-EPINEPHRINE (PF) 1 %-1:200000 IJ SOLN
INTRAMUSCULAR | Status: AC
  Filled 2013-03-26: qty 10

## 2013-03-26 MED ORDER — OXYCODONE HCL 5 MG PO TABS: 5.0000 mg | ORAL_TABLET | ORAL | Status: DC | PRN

## 2013-03-26 MED ORDER — PROPOFOL INFUSION 10 MG/ML OPTIME
INTRAVENOUS | Status: DC | PRN
  Administered 2013-03-26: 75 ug/kg/min via INTRAVENOUS

## 2013-03-26 SURGICAL SUPPLY — 72 items
ARMBAND PINK RESTRICT EXTREMIT (MISCELLANEOUS) ×4 IMPLANT
BAG DECANTER FOR FLEXI CONT (MISCELLANEOUS) IMPLANT
BANDAGE ESMARK 6X9 LF (GAUZE/BANDAGES/DRESSINGS) ×3 IMPLANT
BNDG ESMARK 6X9 LF (GAUZE/BANDAGES/DRESSINGS) ×4
CANISTER SUCTION 2500CC (MISCELLANEOUS) ×8 IMPLANT
CARDIAC SUCTION (MISCELLANEOUS) ×4 IMPLANT
CATH CANNON HEMO 15F 50CM (CATHETERS) IMPLANT
CATH CANNON HEMO 15FR 19 (HEMODIALYSIS SUPPLIES) ×4 IMPLANT
CATH CANNON HEMO 15FR 23CM (HEMODIALYSIS SUPPLIES) ×4 IMPLANT
CATH CANNON HEMO 15FR 31CM (HEMODIALYSIS SUPPLIES) IMPLANT
CATH CANNON HEMO 15FR 32CM (HEMODIALYSIS SUPPLIES) IMPLANT
CATH EMB 4FR 80CM (CATHETERS) ×12 IMPLANT
CHLORAPREP W/TINT 26ML (MISCELLANEOUS) ×4 IMPLANT
CLIP TI MEDIUM 6 (CLIP) ×8 IMPLANT
CLIP TI WIDE RED SMALL 6 (CLIP) ×8 IMPLANT
CLOTH BEACON ORANGE TIMEOUT ST (SAFETY) ×8 IMPLANT
COVER PROBE W GEL 5X96 (DRAPES) ×4 IMPLANT
COVER SURGICAL LIGHT HANDLE (MISCELLANEOUS) ×8 IMPLANT
CUFF TOURNIQUET SINGLE 24IN (TOURNIQUET CUFF) ×4 IMPLANT
DECANTER SPIKE VIAL GLASS SM (MISCELLANEOUS) ×4 IMPLANT
DERMABOND ADVANCED (GAUZE/BANDAGES/DRESSINGS) ×1
DERMABOND ADVANCED .7 DNX12 (GAUZE/BANDAGES/DRESSINGS) ×3 IMPLANT
DRAPE C-ARM 42X72 X-RAY (DRAPES) ×4 IMPLANT
DRAPE CHEST BREAST 15X10 FENES (DRAPES) ×4 IMPLANT
DRAPE X-RAY CASS 24X20 (DRAPES) IMPLANT
ELECT REM PT RETURN 9FT ADLT (ELECTROSURGICAL) ×8
ELECTRODE REM PT RTRN 9FT ADLT (ELECTROSURGICAL) ×6 IMPLANT
GAUZE SPONGE 2X2 8PLY STRL LF (GAUZE/BANDAGES/DRESSINGS) IMPLANT
GAUZE SPONGE 4X4 16PLY XRAY LF (GAUZE/BANDAGES/DRESSINGS) IMPLANT
GEL ULTRASOUND 20GR AQUASONIC (MISCELLANEOUS) ×4 IMPLANT
GLOVE BIO SURGEON STRL SZ7.5 (GLOVE) ×8 IMPLANT
GLOVE BIOGEL PI IND STRL 6.5 (GLOVE) ×3 IMPLANT
GLOVE BIOGEL PI IND STRL 7.0 (GLOVE) ×9 IMPLANT
GLOVE BIOGEL PI IND STRL 8 (GLOVE) ×6 IMPLANT
GLOVE BIOGEL PI INDICATOR 6.5 (GLOVE) ×1
GLOVE BIOGEL PI INDICATOR 7.0 (GLOVE) ×3
GLOVE BIOGEL PI INDICATOR 8 (GLOVE) ×2
GLOVE ECLIPSE 7.0 STRL STRAW (GLOVE) ×4 IMPLANT
GLOVE SS BIOGEL STRL SZ 6.5 (GLOVE) ×3 IMPLANT
GLOVE SUPERSENSE BIOGEL SZ 6.5 (GLOVE) ×1
GLOVE SURG SS PI 7.0 STRL IVOR (GLOVE) ×4 IMPLANT
GOWN STRL NON-REIN LRG LVL3 (GOWN DISPOSABLE) ×24 IMPLANT
GOWN STRL REIN XL XLG (GOWN DISPOSABLE) ×4 IMPLANT
KIT BASIN OR (CUSTOM PROCEDURE TRAY) ×8 IMPLANT
KIT ROOM TURNOVER OR (KITS) ×8 IMPLANT
NEEDLE 18GX1X1/2 (RX/OR ONLY) (NEEDLE) ×4 IMPLANT
NEEDLE 22X1 1/2 (OR ONLY) (NEEDLE) ×4 IMPLANT
NEEDLE HYPO 25GX1X1/2 BEV (NEEDLE) IMPLANT
NS IRRIG 1000ML POUR BTL (IV SOLUTION) ×8 IMPLANT
PACK CV ACCESS (CUSTOM PROCEDURE TRAY) ×8 IMPLANT
PACK SURGICAL SETUP 50X90 (CUSTOM PROCEDURE TRAY) IMPLANT
PAD ARMBOARD 7.5X6 YLW CONV (MISCELLANEOUS) ×16 IMPLANT
SET COLLECT BLD 21X3/4 12 (NEEDLE) IMPLANT
SPONGE GAUZE 2X2 STER 10/PKG (GAUZE/BANDAGES/DRESSINGS)
SPONGE SURGIFOAM ABS GEL 100 (HEMOSTASIS) IMPLANT
STOPCOCK 4 WAY LG BORE MALE ST (IV SETS) IMPLANT
SUT ETHILON 3 0 PS 1 (SUTURE) ×4 IMPLANT
SUT PROLENE 6 0 BV (SUTURE) ×16 IMPLANT
SUT VIC AB 3-0 SH 27 (SUTURE) ×3
SUT VIC AB 3-0 SH 27X BRD (SUTURE) ×9 IMPLANT
SUT VICRYL 4-0 PS2 18IN ABS (SUTURE) ×16 IMPLANT
SYR 20CC LL (SYRINGE) ×8 IMPLANT
SYR 30ML LL (SYRINGE) ×4 IMPLANT
SYR 5ML LL (SYRINGE) ×8 IMPLANT
SYR CONTROL 10ML LL (SYRINGE) IMPLANT
SYRINGE 10CC LL (SYRINGE) ×4 IMPLANT
TAPE CLOTH SURG 4X10 WHT LF (GAUZE/BANDAGES/DRESSINGS) ×4 IMPLANT
TOWEL OR 17X24 6PK STRL BLUE (TOWEL DISPOSABLE) ×8 IMPLANT
TOWEL OR 17X26 10 PK STRL BLUE (TOWEL DISPOSABLE) ×8 IMPLANT
TUBING EXTENTION W/L.L. (IV SETS) IMPLANT
UNDERPAD 30X30 INCONTINENT (UNDERPADS AND DIAPERS) ×8 IMPLANT
WATER STERILE IRR 1000ML POUR (IV SOLUTION) ×8 IMPLANT

## 2013-03-26 NOTE — Preoperative (Signed)
Beta Blockers   Reason not to administer Beta Blockers:Not Applicable 

## 2013-03-26 NOTE — Anesthesia Postprocedure Evaluation (Signed)
  Anesthesia Post-op Note  Patient: Ernest [name]  Procedure(s) Performed: Procedure(s): Attempted thrombectomy of left arm arteriovenous goretex graft. (Left) INSERTION OF DIALYSIS CATHETER Left internal jugular vein (N/A) REMOVAL OF  NON INCORPORATED ARTERIOVENOUS GORETEX GRAFT (AVGG) left arm * repair of  left brachial artery with vein patch angioplasty. (Left)  Patient Location: PACU  Anesthesia Type:MAC  Level of Consciousness: awake, alert  and oriented  Airway and Oxygen Therapy: Patient Spontanous Breathing and Patient connected to nasal cannula oxygen  Post-op Pain: none  Post-op Assessment: Post-op Vital signs reviewed  Post-op Vital Signs: Reviewed  Complications: No apparent anesthesia complications

## 2013-03-26 NOTE — Progress Notes (Signed)
Family contact number is [phone] information given to Mickel Baas in PACU. Patient belongings that were brought to post op in short stay were given to [facility] and left in post op

## 2013-03-26 NOTE — Progress Notes (Signed)
Called from PACU that pt's attempted access declot was unsuccessful.   A left IJ TDC was placed. Had a partial HD on 6/9 (due to access failure), none since.   K is 5.8, and has outpt spot at [facility] tomorrow, but is SOB despite CXR showing just atelectasis.  He left HD after his partial treatment on 6/9 about 2 kg over his EDW of 117.5.  Weight today is pending.  Will plan to  HD for 2 1/2 hours on 2K bath for 2 1/2 liters tonight and then he can go home, with instructions to attend his regularly scheduled treatment on Friday at [facility].. Orders written.  A left ij placed for IV access will need to be removed before discharge.

## 2013-03-26 NOTE — Progress Notes (Signed)
Dr. Lorrene Reid paged told about pt K is 5.8 SOB Chest xray obtained will call results to Dr. Lorrene Reid ,pt not had hemodialysis since Monday today is thursday

## 2013-03-26 NOTE — Anesthesia Preprocedure Evaluation (Addendum)
Anesthesia Evaluation  Patient identified by MRN, date of birth, ID band Patient awake    Reviewed: Allergy & Precautions, H&P , NPO status , Patient's Chart, lab work & pertinent test results, reviewed documented beta blocker date and time   Airway Mallampati: III TM Distance: >3 FB Neck ROM: full    Dental  (+) Edentulous Upper and Dental Advisory Given   Pulmonary shortness of breath and with exertion, COPDformer smoker,  breath sounds clear to auscultation  Pulmonary exam normal       Cardiovascular hypertension, Pt. on home beta blockers + CAD Rhythm:regular Rate:Normal  Cath 11/13 with modest disease left main, LAD, and proximal RCA.  Did not appear to be hemodynamically significant.  Mild LV dysfunction with exertion.   Neuro/Psych negative neurological ROS  negative psych ROS   GI/Hepatic negative GI ROS, (+) Hepatitis -, C  Endo/Other  diabetes, Well Controlled, Type 2, Insulin DependentMorbid obesity  Renal/GU CRFRenal disease  negative genitourinary   Musculoskeletal   Abdominal   Peds  Hematology negative hematology ROS (+)   Anesthesia Other Findings   Reproductive/Obstetrics negative OB ROS                         Anesthesia Physical Anesthesia Plan  ASA: III  Anesthesia Plan: MAC   Post-op Pain Management:    Induction:   Airway Management Planned:   Additional Equipment: CVP and Ultrasound Guidance Line Placement  Intra-op Plan:   Post-operative Plan:   Informed Consent: I have reviewed the patients History and Physical, chart, labs and discussed the procedure including the risks, benefits and alternatives for the proposed anesthesia with the patient or authorized representative who has indicated his/her understanding and acceptance.   Dental Advisory Given  Plan Discussed with: CRNA and Surgeon  Anesthesia Plan Comments:       Anesthesia Quick Evaluation

## 2013-03-26 NOTE — Op Note (Signed)
NAME: Ernest [name]   MRN: [id number] DOB: [birth year]    DATE OF OPERATION: 03/26/2013  PREOP DIAGNOSIS: Stage V Chronic Kidney Disease  POSTOP DIAGNOSIS: Same  PROCEDURE:  1. Attempted thrombectomy of left forearm AVG 2. Removal of possibly infected left forearm AVG 3. Vein Patch angioplasty of the left brachial artery 4. Ultrasound guided placement of Left IJ Diatek catheter (27 cm)  SURGEON: [name]. Scot Dock, MD, FACS  ASSIST: Wray Kearns PA  ANESTHESIA: local with sedation   EBL: minimal  INDICATIONS: ELLSWORTH CHARLEBOIS is a 67 y.o. male who had exhausted his right upper extremity access options. He had a previous forearm and upper arm fistula in the left arm and was not a candidate for a basilic vein transposition. He had a left forearm AV graft placed which occluded. He underwent thrombolysis and no good outflow was demonstrated. I was asked to place a new access in the left arm (upper arm left AV graft).  FINDINGS:  1. The left forearm AV graft was not incorporated although there was no evidence of gross infection. Intraoperative culture was obtained of the tract. 2. I had considered jumping higher on the brachial vein in the upper arm however given that the graft was not incorporated and that the patient had very high venous pressures I elected to not place a new left upper arm AV graft until he had a central venogram to rule out a central venous stenosis on the left.  TECHNIQUE:  The patient was taken to the operative room and sedated by anesthesia. Her left upper extremity was prepped and draped in the usual sterile fashion. My original plan was to place a new left upper arm AV graft. Given the scar at the antecubital level an incision in the distal third of the upper arm after the skin was anesthetized. I plan was to use the brachial artery at this level for inflow. The brachial veins at this level appeared to be adequate size for this reason I thought it might be  reasonable to attempt thrombectomy of his left forearm graft and jump to one of these brachial veins. Therefore after the skin was anesthetized, the incision at the antecubital level is opened and the arterial and venous limbs of the graft were dissected free. There was a lymphocele around the graft and at this level the graft was not well incorporated although there was no evidence of gross infection. I ligated the venous limb of the graft and divided the venous limb with plans to thrombectomize the graft and jump higher on the brachial vein. The patient was heparinized. Graft thrombectomy was achieved using the #4 for catheter. The arterial plug was retrieved. Once full flows established through the graft and the graft was clamped it was bleeding throughout the entire tunnel consistent with non-incorporation of the graft. This reason I did not think his graft could be salvaged and it could potentially be infected. In addition, when the brachial vein was opened there was markedly elevated venous pressure suggesting possible central venous stenosis on the left. All things considered I do not think he would be wise to place a left upper arm graft given these findings. Therefore a segment of vein which had been ligated and spatulated was excised to be used as a vein patch. There was dense scar tissue around the brachial artery and therefore elected to use a tourniquet.  A tourniquet was placed on the upper arm and arm was exsanguinated with an Esmarch bandage. Tourniquet was  inflated to 325 mm mercury. Under tourniquet control, the entire arterial limb of the graft was removed from the brachial artery. The vein patch was sewn using continuous 6-0 Prolene suture. At completion the tourniquet was released and there was a palpable left radial pulse. Hemostasis was obtained in these wounds and the heparin was partially reversed with protamine. The wounds were each closed with a deep layer of 3-0 Vicryl and the skin  closed with 4-0 Vicryl. Dermabond was applied.  Next attention was turned to placement of a tunneled dialysis catheter. The neck and upper chest were prepped and draped in usual sterile fashion. Under ultrasound guidance and after the skin was anesthetized I attempted to cannulate the vein however there was no good return suggesting this was likely thrombosed. Therefore elected a left-sided approach. Because of poor IV access anesthesia had placed a left IJ catheter although this was high enough in the neck I thought I could get below this. Under ultrasound guidance, after the skin was anesthetized, the left IJ was cannulated and a guidewire introduced into the superior vena cava under fluoroscopic control. The tract of the wire was dilated and the dilator and peel-away sheath were passed over the wire. The dilator was removed. The 27 cm catheter was threaded over the wire through the peel-away sheath down into the right atrium. The wire was removed. The exit site the catheter was selected and the skin anesthetized between the 2 areas. The cath was brought through the tunnel cut to the appropriate length and the distal ports were attached. Both ports withdrew easily and then flushed with heparinized saline filled with concentrated heparin. The catheter was secured at its exit site with a 3-0 nylon suture. The IJ cannulation site closed with 40 subcutaneous stitch. Dressing was applied. Patient tolerated the procedure well and was transferred to the recovery room in stable condition. All needle and sponge counts were correct.  CLINICAL NOTE: I will schedule the patient for central venogram on the left. He does not have central venous stenosis he could be scheduled electively for placement of a left upper arm graft. Given the scar in the antecubital level he would likely require an upper arm loop graft.  Deitra Mayo, MD, FACS [facility]  DATE OF DICTATION:    03/26/2013

## 2013-03-26 NOTE — Progress Notes (Signed)
Pt's wife was called to inform her that pt is going to hemodialysis for 2.5 hours ,at the hospital and then will be discharged . Pt's wife was very upset crying. Gave phone to pt who spoke with wife . Pt then transported with O2 to dialsyis unity, assisted to weigh and assisted to recliner, report given again to nurses in hemodialysis.

## 2013-03-26 NOTE — Transfer of Care (Signed)
Immediate Anesthesia Transfer of Care Note  Patient: Ernest [name]  Procedure(s) Performed: Procedure(s): Attempted thrombectomy of left arm arteriovenous goretex graft. (Left) INSERTION OF DIALYSIS CATHETER Left internal jugular vein (N/A) REMOVAL OF  NON INCORPORATED ARTERIOVENOUS GORETEX GRAFT (AVGG) left arm * repair of  left brachial artery with vein patch angioplasty. (Left)  Patient Location: PACU  Anesthesia Type:MAC  Level of Consciousness: awake and sedated  Airway & Oxygen Therapy: Patient connected to nasal cannula oxygen  Post-op Assessment: Report given to PACU RN  Post vital signs: stable  Complications: No apparent anesthesia complications

## 2013-03-26 NOTE — Progress Notes (Signed)
Tx stopped with 1 hr left d/t problems with the new catheter. Called Dr. Jamal Maes the nephrologist to inform her of the problem with the catheter. She ordered to remove 1 liter of fluid and stop the tx since pt is going for dialysis at the hemodialysis center on 03/27/13. Assisted pt with dressing; pt able to stand and weigh, denies pain, SOB and nausea/vomitting. He was accompanied to the ED by Arnetha Massy, Tech.

## 2013-03-26 NOTE — Progress Notes (Signed)
Dr. Lorrene Reid called read chest xray writing orders for patinet to have hemodialysis for 2.5 hours tonight and to go to his regular dialysis treatment tomorrow

## 2013-03-26 NOTE — H&P (Signed)
[facility] [place]  Patient name: Ernest [name] MRN: [id number] DOB: 1946/04/15 Sex: male  REASON FOR CONSULT: needs new hemodialysis access.  HPI: Ernest [name] is a 67 y.o. male plan placement of a new left forearm AV graft on 01/13/2013. He has had upper arm and forearm fistulas on both sides and his basilic veins were not adequate for a basilic vein transposition. I placed a left forearm loop graft on 01/13/2013 and noted that the vein was very small. This graft subsequently occluded. He underwent thrombolysis by Dr. Augustin Coupe. He noted that the outflow vein was very small and there were no options for salvage of the graft. We discussed the case and felt that the next best option would be an upper arm graft on the left. We are asked to place a graft couldn't be cannulated immediately.  Past Medical History  Diagnosis Date  . Abnormal electrocardiogram   . Hyperlipidemia   . Retinopathy   . Gout   . ESRD (end stage renal disease)     on hemodialysis Mon, Wed, Fri - followed by [name]  . Type II or unspecified type diabetes mellitus without mention of complication, not stated as uncontrolled     adult onset  . Erectile dysfunction     penile implant  . Abnormal stress test     s/p cath November 2013 with modest disease involving the ostial left main, proximal LAD, proximal RCA - do not appear to be hemodynamically signficant; mild LV dysfunction  . Obesity   . Shortness of breath     with exertion  . Hypertension   . Hepatitis C   . COPD (chronic obstructive pulmonary disease)     Family History  Problem Relation Age of Onset  . Heart disease Father    SOCIAL HISTORY: History  Substance Use Topics  . Smoking status: Former Smoker -- 7 years    Quit date: 06/10/1973  . Smokeless tobacco: Never Used  . Alcohol Use: No   [name]  Current Facility-Administered Medications  Medication Dose Route Frequency Provider Last Rate Last Dose   . 0.9 %  sodium chloride infusion   Intravenous Continuous Angelia Mould, MD      . ceFAZolin (ANCEF) 2-3 GM-% IVPB SOLR           . cefUROXime (ZINACEF) 1.5 g in dextrose 5 % 50 mL IVPB  1.5 g Intravenous 30 min Pre-Op Angelia Mould, MD      . mupirocin ointment (BACTROBAN) 2 %   Nasal BID Angelia Mould, MD       REVIEW OF SYSTEMS: Valu.Nieves ] denotes positive finding; [  ] denotes negative finding CARDIOVASCULAR:  [ ]  chest pain   [ ]  chest pressure   [ ]  palpitations   [ ]  orthopnea   [ ]  dyspnea on exertion   [ ]  claudication   [ ]  rest pain   [ ]  DVT   [ ]  phlebitis PULMONARY:   [ ]  productive cough   [ ]  asthma   [ ]  wheezing NEUROLOGIC:   [ ]  weakness  [ ]  paresthesias  [ ]  aphasia  [ ]  amaurosis  [ ]  dizziness HEMATOLOGIC:   [ ]  bleeding problems   [ ]  clotting disorders MUSCULOSKELETAL:  [ ]  joint pain   [ ]  joint swelling [ ]  leg swelling GASTROINTESTINAL: [ ]   blood in stool  [ ]   hematemesis GENITOURINARY:  [ ]   dysuria  [ ]   hematuria PSYCHIATRIC:  [ ]  history of major depression INTEGUMENTARY:  [ ]  rashes  [ ]  ulcers CONSTITUTIONAL:  [ ]  fever   [ ]  chills  PHYSICAL EXAM: Filed Vitals:   03/26/13 0836  BP: 198/83  Pulse: 64  Temp: 97.2 F (36.2 C)  TempSrc: Oral  Resp: 18  Height: 5\' 9"  (1.753 m)  Weight: 260 lb 5.8 oz (118.1 kg)  SpO2: 96%   Body mass index is 38.43 kg/(m^2). GENERAL: The patient is a well-nourished male, in no acute distress. The vital signs are documented above. CARDIOVASCULAR: There is a regular rate and rhythm.  PULMONARY: There is good air exchange bilaterally without wheezing or rales. ABDOMEN: Soft and non-tender with normal pitched bowel sounds.  MUSCULOSKELETAL: There are no major deformities or cyanosis. NEUROLOGIC: No focal weakness or paresthesias are detected. SKIN: There are no ulcers or rashes noted. PSYCHIATRIC: The patient has a normal affect.  DATA:  Lab Results  Component Value Date   WBC 12.0* 03/01/2013    HGB 13.6 03/26/2013   HCT 40.0 03/26/2013   MCV 89.8 03/01/2013   PLT 280 03/01/2013   Lab Results  Component Value Date   NA 144 03/26/2013   K 5.8* 03/26/2013   CL 99 03/01/2013   CO2 27 03/01/2013   Lab Results  Component Value Date   CREATININE 10.89* 03/01/2013   Lab Results  Component Value Date   INR 1.0 08/21/2012   INR 1.02 10/14/2009   INR 1.12 09/26/2009   No results found for this basename: HGBA1C   CBG (last 3)   Recent Labs  03/26/13 1050 03/26/13 1319  GLUCAP 109* 112*    MEDICAL ISSUES: Plan placement of left upper arm AV graft. The procedure and risk were discussed with the patient and he is agreeable to proceed. All his questions were answered.  [place] [facility] [place] Beeper: [phone]

## 2013-03-26 NOTE — Progress Notes (Signed)
No pneumothorax on chest xray.

## 2013-03-29 LAB — WOUND CULTURE

## 2013-03-31 ENCOUNTER — Encounter (HOSPITAL_COMMUNITY): Payer: Self-pay | Admitting: Vascular Surgery

## 2013-04-01 ENCOUNTER — Other Ambulatory Visit: Payer: Self-pay | Admitting: *Deleted

## 2013-04-02 ENCOUNTER — Encounter (HOSPITAL_COMMUNITY): Payer: Self-pay | Admitting: Pharmacy Technician

## 2013-04-07 ENCOUNTER — Ambulatory Visit (HOSPITAL_COMMUNITY)
Admission: RE | Admit: 2013-04-07 | Discharge: 2013-04-07 | Disposition: A | Discharge status: Home / Self Care | Payer: Medicare Other | Source: Ambulatory Visit | Attending: Surgery | Admitting: Surgery

## 2013-04-07 ENCOUNTER — Encounter (HOSPITAL_COMMUNITY)
Admission: RE | Disposition: A | Discharge status: Home / Self Care | Payer: Self-pay | Source: Ambulatory Visit | Attending: Surgery

## 2013-04-07 DIAGNOSIS — B1920 Unspecified viral hepatitis C without hepatic coma: Secondary | ICD-10-CM | POA: Diagnosis not present

## 2013-04-07 DIAGNOSIS — Z79899 Other long term (current) drug therapy: Secondary | ICD-10-CM | POA: Insufficient documentation

## 2013-04-07 DIAGNOSIS — E119 Type 2 diabetes mellitus without complications: Secondary | ICD-10-CM | POA: Insufficient documentation

## 2013-04-07 DIAGNOSIS — N186 End stage renal disease: Secondary | ICD-10-CM | POA: Diagnosis not present

## 2013-04-07 DIAGNOSIS — J449 Chronic obstructive pulmonary disease, unspecified: Secondary | ICD-10-CM | POA: Insufficient documentation

## 2013-04-07 DIAGNOSIS — Z87891 Personal history of nicotine dependence: Secondary | ICD-10-CM | POA: Diagnosis not present

## 2013-04-07 DIAGNOSIS — Z992 Dependence on renal dialysis: Secondary | ICD-10-CM | POA: Insufficient documentation

## 2013-04-07 DIAGNOSIS — H3500 Unspecified background retinopathy: Secondary | ICD-10-CM | POA: Insufficient documentation

## 2013-04-07 DIAGNOSIS — Z7902 Long term (current) use of antithrombotics/antiplatelets: Secondary | ICD-10-CM | POA: Diagnosis not present

## 2013-04-07 DIAGNOSIS — J4489 Other specified chronic obstructive pulmonary disease: Secondary | ICD-10-CM | POA: Insufficient documentation

## 2013-04-07 DIAGNOSIS — M109 Gout, unspecified: Secondary | ICD-10-CM | POA: Insufficient documentation

## 2013-04-07 DIAGNOSIS — Z7982 Long term (current) use of aspirin: Secondary | ICD-10-CM | POA: Insufficient documentation

## 2013-04-07 DIAGNOSIS — Z6838 Body mass index (BMI) 38.0-38.9, adult: Secondary | ICD-10-CM | POA: Diagnosis not present

## 2013-04-07 DIAGNOSIS — Z794 Long term (current) use of insulin: Secondary | ICD-10-CM | POA: Insufficient documentation

## 2013-04-07 DIAGNOSIS — I120 Hypertensive chronic kidney disease with stage 5 chronic kidney disease or end stage renal disease: Secondary | ICD-10-CM | POA: Insufficient documentation

## 2013-04-07 DIAGNOSIS — E785 Hyperlipidemia, unspecified: Secondary | ICD-10-CM | POA: Diagnosis not present

## 2013-04-07 DIAGNOSIS — E669 Obesity, unspecified: Secondary | ICD-10-CM | POA: Diagnosis not present

## 2013-04-07 HISTORY — PX: VENOGRAM: SHX5497

## 2013-04-07 LAB — POCT I-STAT, CHEM 8
BUN: 32 mg/dL — ABNORMAL HIGH (ref 6–23)
Chloride: 100 mEq/L (ref 96–112)
Creatinine, Ser: 8.8 mg/dL — ABNORMAL HIGH (ref 0.50–1.35)
Potassium: 4.1 mEq/L (ref 3.5–5.1)
Sodium: 139 mEq/L (ref 135–145)

## 2013-04-07 SURGERY — VENOGRAM
Anesthesia: LOCAL

## 2013-04-07 MED ORDER — LABETALOL HCL 5 MG/ML IV SOLN: 10.0000 mg | INTRAVENOUS | Status: DC | PRN

## 2013-04-07 MED ORDER — SODIUM CHLORIDE 0.9 % IJ SOLN: 3.0000 mL | INTRAMUSCULAR | Status: DC | PRN

## 2013-04-07 MED ORDER — ONDANSETRON HCL 4 MG/2ML IJ SOLN: 4.0000 mg | Freq: Four times a day (QID) | INTRAMUSCULAR | Status: DC | PRN

## 2013-04-07 MED ORDER — HYDRALAZINE HCL 20 MG/ML IJ SOLN: 10.0000 mg | INTRAMUSCULAR | Status: DC | PRN

## 2013-04-07 NOTE — Op Note (Signed)
Vascular and Vein Specialists of [facility]  Patient name: Ernest [name] MRN: [id number] DOB: [birth year] Sex: male  04/07/2013 Pre-operative Diagnosis: End-stage renal disease Post-operative diagnosis:  Same Surgeon:  Eldridge Abrahams Procedure Performed:  1.  left arm and central venogram   Indications:  The patient needs new access. He currently has a left-sided catheter. He comes in for evaluation of the central venous system.  Procedure:  The patient was identified in the holding area and taken to room 8.  The patient was then placed supine on the table and prepped and draped in the usual sterile fashion.  A time out was called.  The IV placed in the holding area was not suitable. Ultrasound was used to evaluate the median cubital vein.  The vein was patent and compressible.   The vein was then accessed under ultrasound guidance using a micropuncture needle.  An 018 wire was then asvanced without resistance and a micropuncture sheath was placed.  Contrast injections were then performed through the sheath.  Findings:  Contrast goes into the cephalic venous system which is atretic and occluded. There is reflux into the brachial vein. The axillary subclavian, innominate and superior vena cava appeared to be patent. There may be a stenosis within the innominate vein secondary to the existing dialysis catheter.    Intervention:  None  Impression:  #1  the central venous system on the left appears to be patent. Cannot exclude the possibility of the stenosis within the innominate vein secondary to the existing dialysis catheter  #2  the patient was scheduled for a left upper arm loop graft as he has an incision already in the antecubital region  V. [name], M.D. [facility] of [place] Office: [phone] Pager:  [phone]

## 2013-04-07 NOTE — H&P (Signed)
Vascular and Vein Specialist of [facility]   Patient name: Ernest [name] MRN: [id number] DOB: [birth year] Sex: male    No chief complaint on file.   HISTORY OF PRESENT ILLNESS: Ernest [name] is a 67 y.o. male plan placement of a new left forearm AV graft on 01/13/2013. He has had upper arm and forearm fistulas on both sides and his basilic veins were not adequate for a basilic vein transposition. I placed a left forearm loop graft on 01/13/2013 and noted that the vein was very small. This graft subsequently occluded. He underwent thrombolysis by Dr. Augustin Coupe. He noted that the outflow vein was very small and there were no options for salvage of the graft. He comes in today for a central venogram on the left to evaluate for the possibility of a left upper arm graft   Past Medical History  Diagnosis Date  . Abnormal electrocardiogram   . Hyperlipidemia   . Retinopathy   . Gout   . ESRD (end stage renal disease)     on hemodialysis Mon, Wed, Fri - followed by [name]  . Type II or unspecified type diabetes mellitus without mention of complication, not stated as uncontrolled     adult onset  . Erectile dysfunction     penile implant  . Abnormal stress test     s/p cath November 2013 with modest disease involving the ostial left main, proximal LAD, proximal RCA - do not appear to be hemodynamically signficant; mild LV dysfunction  . Obesity   . Shortness of breath     with exertion  . Hypertension   . Hepatitis C   . COPD (chronic obstructive pulmonary disease)     Past Surgical History  Procedure Laterality Date  . Penile prosthesis implant    . Fistula      RUE and wrist  . Cardiac catheterization  August 26, 2012  . Eye surgery      laser.  and surgery for DM  . Insertion of dialysis catheter Right 01/01/2013    Procedure: INSERTION OF DIALYSIS CATHETER right  internal jugular;  Surgeon: Serafina Mitchell, MD;  Location: [place];  Service: Vascular;  Laterality: Right;  .  Av fistula placement Left 01/13/2013    Procedure: INSERTION OF ARTERIOVENOUS (AV) GORE-TEX GRAFT ARM;  Surgeon: Angelia Mould, MD;  Location: [place];  Service: Vascular;  Laterality: Left;  . Thrombectomy w/ embolectomy Left 03/26/2013    Procedure: Attempted thrombectomy of left arm arteriovenous goretex graft.;  Surgeon: Angelia Mould, MD;  Location: [place];  Service: Vascular;  Laterality: Left;  . Insertion of dialysis catheter N/A 03/26/2013    Procedure: INSERTION OF DIALYSIS CATHETER Left internal jugular vein;  Surgeon: Angelia Mould, MD;  Location: [place];  Service: Vascular;  Laterality: N/A;  . Avgg removal Left 03/26/2013    Procedure: REMOVAL OF  NON INCORPORATED ARTERIOVENOUS GORETEX GRAFT ([place]) left arm * repair of  left brachial artery with vein patch angioplasty.;  Surgeon: Angelia Mould, MD;  Location: [place];  Service: Vascular;  Laterality: Left;    History   Social History  . Marital Status: Married    Spouse Name: N/A    Number of Children: N/A  . Years of Education: N/A   Occupational History  . 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 Smoking status: Former Smoker -- 7 years    Quit date: 06/10/1973  . Smokeless  tobacco: Never Used  . Alcohol Use: No  . Drug Use: No  . Sexually Active: No   Other Topics Concern  . Not on file   Social History Narrative   Adopted mentally retarded child at home, 2 adopted children, 3 living and 2 deceased.    Family History  Problem Relation Age of Onset  . Heart disease Father     Allergies as of 04/01/2013  . ([name])    No current facility-administered medications on file prior to encounter.   Current Outpatient Prescriptions on File Prior to Encounter  Medication Sig Dispense Refill  . allopurinol (ZYLOPRIM) 100 MG tablet Take 100 mg by mouth daily.       [name] amitriptyline (ELAVIL) 25 MG tablet Take 25 mg by mouth at bedtime.      [name] aspirin 325 MG  tablet Take 325 mg by mouth daily.        . Calcium Acetate 667 MG TABS Take 2-3 tablets by mouth 3 (three) times daily with meals. Takes 3 capsules with meals and 2 capsules with snacks      . cinacalcet (SENSIPAR) 60 MG tablet Take 60 mg by mouth daily.      . clopidogrel (PLAVIX) 75 MG tablet Take 75 mg by mouth daily.       . colchicine 0.6 MG tablet Take 0.6 mg by mouth daily.       . insulin NPH (HUMULIN N,NOVOLIN N) 100 UNIT/ML injection Inject 30-50 Units into the skin 2 (two) times daily. Takes 50 units in the morning and 30 units at night      . insulin regular (NOVOLIN R,HUMULIN R) 100 units/mL injection Inject 30-40 Units into the skin 3 (three) times daily before meals. Takes 40 units in the morning; Takes 30 units midday; Takes 40 units at night      . lidocaine-prilocaine (EMLA) cream Apply 1 application topically 3 (three) times a week. Prior to dialysis      . lisinopril (PRINIVIL,ZESTRIL) 10 MG tablet Take 10 mg by mouth daily.      . metoprolol (LOPRESSOR) 50 MG tablet Take 50 mg by mouth every evening.       . multivitamin (RENA-VIT) TABS tablet Take 1 tablet by mouth daily.        [name] oxyCODONE (ROXICODONE) 5 MG immediate release tablet Take 1-2 tablets (5-10 mg total) by mouth every 4 (four) hours as needed for pain.  30 tablet  0  . sevelamer (RENVELA) 800 MG tablet Take 1,600 mg by mouth 3 (three) times daily with meals.      . simvastatin (ZOCOR) 20 MG tablet Take 20 mg by mouth at bedtime.           REVIEW OF SYSTEMS: No chest pain No shortness of breath  PHYSICAL EXAMINATION:   Vital signs are BP 134/63  Pulse 61  Temp(Src) 97.1 F (36.2 C) (Oral)  Resp 18  Ht 5\' 9"  (1.753 m)  Wt 260 lb (117.935 kg)  BMI 38.38 kg/m2  SpO2 98% General: The patient appears their stated age. HEENT:  No gross abnormalities Pulmonary:  Non labored breathing Musculoskeletal: There are no major deformities. Neurologic: No focal weakness or paresthesias are detected, Skin: There  are no ulcer or rashes noted. Psychiatric: The patient has normal affect. Cardiovascular: There is a regular rate and rhythm without significant murmur appreciated.   Assessment: End-stage renal disease Plan: Patient is here for a venogram to determine his next access  Eldridge Abrahams, M.D. [facility] of [place] Office: [phone] Pager:  [phone]

## 2013-04-13 DIAGNOSIS — N186 End stage renal disease: Secondary | ICD-10-CM | POA: Diagnosis not present

## 2013-04-15 DIAGNOSIS — N186 End stage renal disease: Secondary | ICD-10-CM | POA: Diagnosis not present

## 2013-04-15 DIAGNOSIS — N2581 Secondary hyperparathyroidism of renal origin: Secondary | ICD-10-CM | POA: Diagnosis not present

## 2013-04-15 DIAGNOSIS — D509 Iron deficiency anemia, unspecified: Secondary | ICD-10-CM | POA: Diagnosis not present

## 2013-04-15 DIAGNOSIS — D631 Anemia in chronic kidney disease: Secondary | ICD-10-CM | POA: Diagnosis not present

## 2013-04-30 ENCOUNTER — Encounter (HOSPITAL_COMMUNITY): Payer: Self-pay

## 2013-04-30 ENCOUNTER — Other Ambulatory Visit: Payer: Self-pay

## 2013-05-01 ENCOUNTER — Encounter (HOSPITAL_COMMUNITY): Payer: Self-pay | Admitting: *Deleted

## 2013-05-01 NOTE — Progress Notes (Signed)
05/01/13 1314  OBSTRUCTIVE SLEEP APNEA  Have you ever been diagnosed with sleep apnea through a sleep study? No  Do you snore loudly (loud enough to be heard through closed doors)?  0  Do you often feel tired, fatigued, or sleepy during the daytime? 1  Has anyone observed you stop breathing during your sleep? 0  Do you have, or are you being treated for high blood pressure? 1  BMI more than 35 kg/m2? 1  Age over 65 years old? 1  Neck circumference greater than 40 cm/18 inches? 1  Gender: 1  Obstructive Sleep Apnea Score 6  Score 4 or greater  Results sent to PCP

## 2013-05-01 NOTE — Progress Notes (Signed)
05/01/13 1314  OBSTRUCTIVE SLEEP APNEA  Score 4 or greater  Results sent to PCP

## 2013-05-04 MED ORDER — DEXTROSE 5 % IV SOLN
1.5000 g | INTRAVENOUS | Status: AC
  Administered 2013-05-05: 1.5 g via INTRAVENOUS
  Filled 2013-05-04: qty 1.5

## 2013-05-05 ENCOUNTER — Encounter (HOSPITAL_COMMUNITY)
Admission: RE | Disposition: A | Discharge status: Home / Self Care | Payer: Self-pay | Source: Ambulatory Visit | Attending: Vascular Surgery

## 2013-05-05 ENCOUNTER — Encounter (HOSPITAL_COMMUNITY): Payer: Self-pay | Admitting: Anesthesiology

## 2013-05-05 ENCOUNTER — Ambulatory Visit (HOSPITAL_COMMUNITY)
Admission: RE | Admit: 2013-05-05 | Discharge: 2013-05-05 | Disposition: A | Discharge status: Home / Self Care | Payer: Medicare Other | Source: Ambulatory Visit | Attending: Vascular Surgery | Admitting: Vascular Surgery

## 2013-05-05 ENCOUNTER — Ambulatory Visit (HOSPITAL_COMMUNITY): Payer: Medicare Other | Admitting: Anesthesiology

## 2013-05-05 DIAGNOSIS — N186 End stage renal disease: Secondary | ICD-10-CM | POA: Diagnosis not present

## 2013-05-05 DIAGNOSIS — J449 Chronic obstructive pulmonary disease, unspecified: Secondary | ICD-10-CM | POA: Insufficient documentation

## 2013-05-05 DIAGNOSIS — E119 Type 2 diabetes mellitus without complications: Secondary | ICD-10-CM | POA: Diagnosis not present

## 2013-05-05 DIAGNOSIS — Z794 Long term (current) use of insulin: Secondary | ICD-10-CM | POA: Diagnosis not present

## 2013-05-05 DIAGNOSIS — Z87891 Personal history of nicotine dependence: Secondary | ICD-10-CM | POA: Insufficient documentation

## 2013-05-05 DIAGNOSIS — B1920 Unspecified viral hepatitis C without hepatic coma: Secondary | ICD-10-CM | POA: Insufficient documentation

## 2013-05-05 DIAGNOSIS — I120 Hypertensive chronic kidney disease with stage 5 chronic kidney disease or end stage renal disease: Secondary | ICD-10-CM | POA: Insufficient documentation

## 2013-05-05 DIAGNOSIS — N529 Male erectile dysfunction, unspecified: Secondary | ICD-10-CM | POA: Insufficient documentation

## 2013-05-05 DIAGNOSIS — Z79899 Other long term (current) drug therapy: Secondary | ICD-10-CM | POA: Diagnosis not present

## 2013-05-05 DIAGNOSIS — M109 Gout, unspecified: Secondary | ICD-10-CM | POA: Diagnosis not present

## 2013-05-05 DIAGNOSIS — E785 Hyperlipidemia, unspecified: Secondary | ICD-10-CM | POA: Diagnosis not present

## 2013-05-05 DIAGNOSIS — Z6838 Body mass index (BMI) 38.0-38.9, adult: Secondary | ICD-10-CM | POA: Diagnosis not present

## 2013-05-05 DIAGNOSIS — Z7982 Long term (current) use of aspirin: Secondary | ICD-10-CM | POA: Diagnosis not present

## 2013-05-05 DIAGNOSIS — H3500 Unspecified background retinopathy: Secondary | ICD-10-CM | POA: Insufficient documentation

## 2013-05-05 DIAGNOSIS — E669 Obesity, unspecified: Secondary | ICD-10-CM | POA: Insufficient documentation

## 2013-05-05 DIAGNOSIS — Z7902 Long term (current) use of antithrombotics/antiplatelets: Secondary | ICD-10-CM | POA: Insufficient documentation

## 2013-05-05 DIAGNOSIS — K759 Inflammatory liver disease, unspecified: Secondary | ICD-10-CM | POA: Diagnosis not present

## 2013-05-05 DIAGNOSIS — I2510 Atherosclerotic heart disease of native coronary artery without angina pectoris: Secondary | ICD-10-CM | POA: Diagnosis not present

## 2013-05-05 DIAGNOSIS — J4489 Other specified chronic obstructive pulmonary disease: Secondary | ICD-10-CM | POA: Insufficient documentation

## 2013-05-05 HISTORY — DX: Atherosclerotic heart disease of native coronary artery without angina pectoris: I25.10

## 2013-05-05 HISTORY — PX: AV FISTULA PLACEMENT: SHX1204

## 2013-05-05 LAB — POCT I-STAT 4, (NA,K, GLUC, HGB,HCT)
Glucose, Bld: 101 mg/dL — ABNORMAL HIGH (ref 70–99)
Potassium: 4.2 mEq/L (ref 3.5–5.1)

## 2013-05-05 LAB — GLUCOSE, CAPILLARY
Glucose-Capillary: 98 mg/dL (ref 70–99)
Glucose-Capillary: 105 mg/dL — ABNORMAL HIGH (ref 70–99)

## 2013-05-05 SURGERY — INSERTION OF ARTERIOVENOUS (AV) GORE-TEX GRAFT ARM
Anesthesia: General | Site: Arm Upper | Laterality: Left | Wound class: Clean

## 2013-05-05 MED ORDER — OXYCODONE HCL 5 MG PO TABS
5.0000 mg | ORAL_TABLET | ORAL | Status: DC | PRN
Start: 2013-05-05 — End: 2013-06-08

## 2013-05-05 MED ORDER — SODIUM CHLORIDE 0.9 % IR SOLN
Status: DC | PRN
  Administered 2013-05-05: 10:00:00

## 2013-05-05 MED ORDER — ONDANSETRON HCL 4 MG/2ML IJ SOLN
INTRAMUSCULAR | Status: DC | PRN
  Administered 2013-05-05: 4 mg via INTRAVENOUS

## 2013-05-05 MED ORDER — MIDAZOLAM HCL 5 MG/5ML IJ SOLN
INTRAMUSCULAR | Status: DC | PRN
  Administered 2013-05-05 (×2): 1 mg via INTRAVENOUS

## 2013-05-05 MED ORDER — LIDOCAINE HCL (CARDIAC) 20 MG/ML IV SOLN
INTRAVENOUS | Status: DC | PRN
  Administered 2013-05-05: 100 mg via INTRAVENOUS

## 2013-05-05 MED ORDER — PROPOFOL 10 MG/ML IV BOLUS
INTRAVENOUS | Status: DC | PRN
  Administered 2013-05-05: 200 mg via INTRAVENOUS

## 2013-05-05 MED ORDER — 0.9 % SODIUM CHLORIDE (POUR BTL) OPTIME
TOPICAL | Status: DC | PRN
  Administered 2013-05-05: 1000 mL

## 2013-05-05 MED ORDER — OXYCODONE HCL 5 MG/5ML PO SOLN: 5.0000 mg | Freq: Once | ORAL | Status: DC | PRN

## 2013-05-05 MED ORDER — HEPARIN SODIUM (PORCINE) 1000 UNIT/ML IJ SOLN
INTRAMUSCULAR | Status: DC | PRN
  Administered 2013-05-05: 8000 [IU] via INTRAVENOUS

## 2013-05-05 MED ORDER — THROMBIN 20000 UNITS EX SOLR
CUTANEOUS | Status: AC
  Filled 2013-05-05: qty 20000

## 2013-05-05 MED ORDER — LIDOCAINE HCL (PF) 1 % IJ SOLN
INTRAMUSCULAR | Status: AC
  Filled 2013-05-05: qty 30

## 2013-05-05 MED ORDER — PROTAMINE SULFATE 10 MG/ML IV SOLN
INTRAVENOUS | Status: DC | PRN
  Administered 2013-05-05: 40 mg via INTRAVENOUS

## 2013-05-05 MED ORDER — METOCLOPRAMIDE HCL 5 MG/ML IJ SOLN: 10.0000 mg | Freq: Once | INTRAMUSCULAR | Status: DC | PRN

## 2013-05-05 MED ORDER — SODIUM CHLORIDE 0.9 % IV SOLN
INTRAVENOUS | Status: DC
  Administered 2013-05-05: 07:00:00 via INTRAVENOUS

## 2013-05-05 MED ORDER — LIDOCAINE-EPINEPHRINE (PF) 1 %-1:200000 IJ SOLN
INTRAMUSCULAR | Status: AC
  Filled 2013-05-05: qty 10

## 2013-05-05 MED ORDER — LIDOCAINE-EPINEPHRINE (PF) 1 %-1:200000 IJ SOLN
INTRAMUSCULAR | Status: DC | PRN
  Administered 2013-05-05: 30 mL

## 2013-05-05 MED ORDER — FENTANYL CITRATE 0.05 MG/ML IJ SOLN
INTRAMUSCULAR | Status: DC | PRN
  Administered 2013-05-05: 50 ug via INTRAVENOUS

## 2013-05-05 MED ORDER — FENTANYL CITRATE 0.05 MG/ML IJ SOLN: 25.0000 ug | INTRAMUSCULAR | Status: DC | PRN

## 2013-05-05 MED ORDER — OXYCODONE HCL 5 MG PO TABS: 5.0000 mg | ORAL_TABLET | Freq: Once | ORAL | Status: DC | PRN

## 2013-05-05 SURGICAL SUPPLY — 42 items
ARMBAND PINK RESTRICT EXTREMIT (MISCELLANEOUS) ×2 IMPLANT
BLADE SURG CLIPPER 3M 9600 (MISCELLANEOUS) ×2 IMPLANT
CANISTER SUCTION 2500CC (MISCELLANEOUS) ×2 IMPLANT
CLIP TI MEDIUM 6 (CLIP) ×2 IMPLANT
CLIP TI WIDE RED SMALL 6 (CLIP) ×2 IMPLANT
CLOTH BEACON ORANGE TIMEOUT ST (SAFETY) ×2 IMPLANT
COVER PROBE W GEL 5X96 (DRAPES) ×2 IMPLANT
COVER SURGICAL LIGHT HANDLE (MISCELLANEOUS) ×2 IMPLANT
DECANTER SPIKE VIAL GLASS SM (MISCELLANEOUS) ×2 IMPLANT
DERMABOND ADVANCED (GAUZE/BANDAGES/DRESSINGS) ×1
DERMABOND ADVANCED .7 DNX12 (GAUZE/BANDAGES/DRESSINGS) ×1 IMPLANT
ELECT REM PT RETURN 9FT ADLT (ELECTROSURGICAL) ×2
ELECTRODE REM PT RTRN 9FT ADLT (ELECTROSURGICAL) ×1 IMPLANT
GLOVE BIO SURGEON STRL SZ7.5 (GLOVE) ×2 IMPLANT
GLOVE BIOGEL PI IND STRL 6.5 (GLOVE) ×2 IMPLANT
GLOVE BIOGEL PI IND STRL 7.0 (GLOVE) ×2 IMPLANT
GLOVE BIOGEL PI IND STRL 7.5 (GLOVE) ×1 IMPLANT
GLOVE BIOGEL PI IND STRL 8 (GLOVE) ×1 IMPLANT
GLOVE BIOGEL PI INDICATOR 6.5 (GLOVE) ×2
GLOVE BIOGEL PI INDICATOR 7.0 (GLOVE) ×2
GLOVE BIOGEL PI INDICATOR 7.5 (GLOVE) ×1
GLOVE BIOGEL PI INDICATOR 8 (GLOVE) ×1
GLOVE ECLIPSE 6.5 STRL STRAW (GLOVE) ×4 IMPLANT
GLOVE SS BIOGEL STRL SZ 6.5 (GLOVE) ×1 IMPLANT
GLOVE SUPERSENSE BIOGEL SZ 6.5 (GLOVE) ×1
GOWN STRL NON-REIN LRG LVL3 (GOWN DISPOSABLE) ×8 IMPLANT
GRAFT GORETEX STRT 4-7X45 (Vascular Products) ×2 IMPLANT
KIT BASIN OR (CUSTOM PROCEDURE TRAY) ×2 IMPLANT
KIT ROOM TURNOVER OR (KITS) ×2 IMPLANT
NEEDLE HYPO 25GX1X1/2 BEV (NEEDLE) ×2 IMPLANT
NS IRRIG 1000ML POUR BTL (IV SOLUTION) ×2 IMPLANT
PACK CV ACCESS (CUSTOM PROCEDURE TRAY) ×2 IMPLANT
PAD ARMBOARD 7.5X6 YLW CONV (MISCELLANEOUS) ×4 IMPLANT
SPONGE SURGIFOAM ABS GEL 100 (HEMOSTASIS) IMPLANT
SUT PROLENE 6 0 BV (SUTURE) ×8 IMPLANT
SUT VIC AB 3-0 SH 27 (SUTURE) ×2
SUT VIC AB 3-0 SH 27X BRD (SUTURE) ×2 IMPLANT
SUT VICRYL 4-0 PS2 18IN ABS (SUTURE) ×4 IMPLANT
TOWEL OR 17X24 6PK STRL BLUE (TOWEL DISPOSABLE) ×2 IMPLANT
TOWEL OR 17X26 10 PK STRL BLUE (TOWEL DISPOSABLE) ×2 IMPLANT
UNDERPAD 30X30 INCONTINENT (UNDERPADS AND DIAPERS) ×2 IMPLANT
WATER STERILE IRR 1000ML POUR (IV SOLUTION) ×2 IMPLANT

## 2013-05-05 NOTE — Interval H&P Note (Signed)
History and Physical Interval Note:  05/05/2013 10:09 AM  Ernest [name]  has presented today for surgery, with the diagnosis of End Stage Renal Disease  The various methods of treatment have been discussed with the patient and family. After consideration of risks, benefits and other options for treatment, the patient has consented to  Procedure(s): INSERTION OF LEFT UPPER ARM LOOP ARTERIOVENOUS GORTEX GRAFT (Left) as a surgical intervention .  The patient's history has been reviewed, patient examined, no change in status, stable for surgery.  I have reviewed the patient's chart and labs.  Questions were answered to the patient's satisfaction.     DICKSON,CHRISTOPHER S

## 2013-05-05 NOTE — H&P (View-Only) (Signed)
Vascular and Vein Specialist of [facility]   Patient name: RAFEL PRINDLE MRN: [id number] DOB: December 25, 1945 Sex: male    No chief complaint on file.   HISTORY OF PRESENT ILLNESS: BRONNER MACGILL is a 67 y.o. male plan placement of a new left forearm AV graft on 01/13/2013. He has had upper arm and forearm fistulas on both sides and his basilic veins were not adequate for a basilic vein transposition. I placed a left forearm loop graft on 01/13/2013 and noted that the vein was very small. This graft subsequently occluded. He underwent thrombolysis by Dr. Augustin Coupe. He noted that the outflow vein was very small and there were no options for salvage of the graft. He comes in today for a central venogram on the left to evaluate for the possibility of a left upper arm graft   Past Medical History  Diagnosis Date  . Abnormal electrocardiogram   . Hyperlipidemia   . Retinopathy   . Gout   . ESRD (end stage renal disease)     on hemodialysis Mon, Wed, Fri - followed by [name]  . Type II or unspecified type diabetes mellitus without mention of complication, not stated as uncontrolled     adult onset  . Erectile dysfunction     penile implant  . Abnormal stress test     s/p cath November 2013 with modest disease involving the ostial left main, proximal LAD, proximal RCA - do not appear to be hemodynamically signficant; mild LV dysfunction  . Obesity   . Shortness of breath     with exertion  . Hypertension   . Hepatitis C   . COPD (chronic obstructive pulmonary disease)     Past Surgical History  Procedure Laterality Date  . Penile prosthesis implant    . Fistula      RUE and wrist  . Cardiac catheterization  August 26, 2012  . Eye surgery      laser.  and surgery for DM  . Insertion of dialysis catheter Right 01/01/2013    Procedure: INSERTION OF DIALYSIS CATHETER right  internal jugular;  Surgeon: Serafina Mitchell, MD;  Location: [place];  Service: Vascular;  Laterality: Right;  .  Av fistula placement Left 01/13/2013    Procedure: INSERTION OF ARTERIOVENOUS (AV) GORE-TEX GRAFT ARM;  Surgeon: Angelia Mould, MD;  Location: [place];  Service: Vascular;  Laterality: Left;  . Thrombectomy w/ embolectomy Left 03/26/2013    Procedure: Attempted thrombectomy of left arm arteriovenous goretex graft.;  Surgeon: Angelia Mould, MD;  Location: [place];  Service: Vascular;  Laterality: Left;  . Insertion of dialysis catheter N/A 03/26/2013    Procedure: INSERTION OF DIALYSIS CATHETER Left internal jugular vein;  Surgeon: Angelia Mould, MD;  Location: [facility];  Service: Vascular;  Laterality: N/A;  . Avgg removal Left 03/26/2013    Procedure: REMOVAL OF  NON INCORPORATED ARTERIOVENOUS GORETEX GRAFT ([place]) left arm * repair of  left brachial artery with vein patch angioplasty.;  Surgeon: Angelia Mould, MD;  Location: [place];  Service: Vascular;  Laterality: Left;    History   Social History  . Marital Status: Married    Spouse Name: N/A    Number of Children: N/A  . Years of Education: N/A   Occupational History  . 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 Smoking status: Former Smoker -- 7 years    Quit date: 06/10/1973  . Smokeless  tobacco: Never Used  . Alcohol Use: No  . Drug Use: No  . Sexually Active: No   Other Topics Concern  . Not on file   Social History Narrative   Adopted mentally retarded child at home, 2 adopted children, 3 living and 2 deceased.    Family History  Problem Relation Age of Onset  . Heart disease Father     Allergies as of 04/01/2013  . ([name])    No current facility-administered medications on file prior to encounter.   Current Outpatient Prescriptions on File Prior to Encounter  Medication Sig Dispense Refill  . allopurinol (ZYLOPRIM) 100 MG tablet Take 100 mg by mouth daily.       [name] amitriptyline (ELAVIL) 25 MG tablet Take 25 mg by mouth at bedtime.      [name] aspirin 325 MG  tablet Take 325 mg by mouth daily.        . Calcium Acetate 667 MG TABS Take 2-3 tablets by mouth 3 (three) times daily with meals. Takes 3 capsules with meals and 2 capsules with snacks      . cinacalcet (SENSIPAR) 60 MG tablet Take 60 mg by mouth daily.      . clopidogrel (PLAVIX) 75 MG tablet Take 75 mg by mouth daily.       . colchicine 0.6 MG tablet Take 0.6 mg by mouth daily.       . insulin NPH (HUMULIN N,NOVOLIN N) 100 UNIT/ML injection Inject 30-50 Units into the skin 2 (two) times daily. Takes 50 units in the morning and 30 units at night      . insulin regular (NOVOLIN R,HUMULIN R) 100 units/mL injection Inject 30-40 Units into the skin 3 (three) times daily before meals. Takes 40 units in the morning; Takes 30 units midday; Takes 40 units at night      . lidocaine-prilocaine (EMLA) cream Apply 1 application topically 3 (three) times a week. Prior to dialysis      . lisinopril (PRINIVIL,ZESTRIL) 10 MG tablet Take 10 mg by mouth daily.      . metoprolol (LOPRESSOR) 50 MG tablet Take 50 mg by mouth every evening.       . multivitamin (RENA-VIT) TABS tablet Take 1 tablet by mouth daily.        [name] oxyCODONE (ROXICODONE) 5 MG immediate release tablet Take 1-2 tablets (5-10 mg total) by mouth every 4 (four) hours as needed for pain.  30 tablet  0  . sevelamer (RENVELA) 800 MG tablet Take 1,600 mg by mouth 3 (three) times daily with meals.      . simvastatin (ZOCOR) 20 MG tablet Take 20 mg by mouth at bedtime.           REVIEW OF SYSTEMS: No chest pain No shortness of breath  PHYSICAL EXAMINATION:   Vital signs are BP 134/63  Pulse 61  Temp(Src) 97.1 F (36.2 C) (Oral)  Resp 18  Ht 5\' 9"  (1.753 m)  Wt 260 lb (117.935 kg)  BMI 38.38 kg/m2  SpO2 98% General: The patient appears their stated age. HEENT:  No gross abnormalities Pulmonary:  Non labored breathing Musculoskeletal: There are no major deformities. Neurologic: No focal weakness or paresthesias are detected, Skin: There  are no ulcer or rashes noted. Psychiatric: The patient has normal affect. Cardiovascular: There is a regular rate and rhythm without significant murmur appreciated.   Assessment: End-stage renal disease Plan: Patient is here for a venogram to determine his next access  Eldridge Abrahams, M.D. [facility] [place] Office: [phone] Pager:  [phone]

## 2013-05-05 NOTE — Transfer of Care (Signed)
Immediate Anesthesia Transfer of Care Note  Patient: Ernest [name]  Procedure(s) Performed: Procedure(s): INSERTION OF LEFT UPPER ARM  ARTERIOVENOUS GORTEX GRAFT (Left)  Patient Location: PACU  Anesthesia Type:General  Level of Consciousness: awake, alert , oriented and patient cooperative  Airway & Oxygen Therapy: Patient Spontanous Breathing and Patient connected to nasal cannula oxygen  Post-op Assessment: Report given to PACU RN and Post -op Vital signs reviewed and stable  Post vital signs: Reviewed and stable  Complications: No apparent anesthesia complications

## 2013-05-05 NOTE — Preoperative (Addendum)
Beta Blockers   Reason not to administer Beta Blockers:metoprolol 05/04/13 PM

## 2013-05-05 NOTE — Anesthesia Procedure Notes (Signed)
Procedure Name: LMA Insertion Date/Time: 05/05/2013 10:36 AM Performed by: Luane School A Pre-anesthesia Checklist: Patient identified, Emergency Drugs available, Suction available and Patient being monitored Patient Re-evaluated:Patient Re-evaluated prior to inductionOxygen Delivery Method: Circle system utilized Preoxygenation: Pre-oxygenation with 100% oxygen Intubation Type: IV induction LMA: LMA with gastric port inserted LMA Size: 4.0 Tube type: Oral Number of attempts: 1 Placement Confirmation: positive ETCO2 and breath sounds checked- equal and bilateral Tube secured with: Tape Dental Injury: Teeth and Oropharynx as per pre-operative assessment

## 2013-05-05 NOTE — Anesthesia Postprocedure Evaluation (Signed)
Anesthesia Post Note  Patient: Ernest [name]  Procedure(s) Performed: Procedure(s) (LRB): INSERTION OF LEFT UPPER ARM  ARTERIOVENOUS GORTEX GRAFT (Left)  Anesthesia type: General  Patient location: PACU  Post pain: Pain level controlled  Post assessment: Patient's Cardiovascular Status Stable  Last Vitals:  Filed Vitals:   05/05/13 1324  BP:   Pulse:   Temp: 36.4 C  Resp:     Post vital signs: Reviewed and stable  Level of consciousness: alert  Complications: No apparent anesthesia complications

## 2013-05-05 NOTE — Op Note (Signed)
NAME: Ernest [name]   MRN: [id number] DOB: Sep 04, 1946    DATE OF OPERATION: 05/05/2013  PREOP DIAGNOSIS: Chronic kidney disease  POSTOP DIAGNOSIS: Same  PROCEDURE: New left upper arm AV graft  SURGEON: [name]. Scot Dock, MD, FACS  ASSIST: Wray Kearns PA  ANESTHESIA: Gen.   EBL: minimal  INDICATIONS: Ernest [name] is a 67 y.o. male who has had an upper arm and forearm cephalic vein graft on both sides and did not have a basilic vein adequate for a basilic vein transposition. I was asked to place a left upper arm AV graft. He had a recent forearm graft that failed.  FINDINGS: axillary vein was good size greater than 5 mm. Brachial artery was somewhat calcified but patent.  TECHNIQUE: Patient was taken to the operating room and received a general anesthetic. The left upper extremity was prepped and draped in usual sterile fashion. A longitudinal incision was made beneath the axilla with a high brachial vein was dissected free. As was a good size vein. The adjacent artery was behind the nerves therefore thought it would be very difficult to tunnel the arterial limb of the graft without compressing the nerve. I therefore elected to originate the graft from the brachial artery above the antecubital level. An oblique incision was made at this level the brachial artery was dissected free beneath the fascia. A tunnel was created between the 2 incisions. A 4-7 mm tapered PTFE graft was tunneled between the 2 incisions and the patient was heparinized. The brachial artery was clamped proximally and distally and a longitudinal arteriotomy was made. A segment of the 4 mm end of the graft was excised, the graft slightly spatulated, and sewn end-to-side to the brachial artery using continuous 6-0 Prolene suture. The graft and poor the appropriate length for anastomosis to the high brachial vein. The vein was ligated distally and spatulated. Graft at the appropriate length, spatulated and sewn end to  end to the vein using continuous 6-0 Prolene suture. At the completion was an excellent thrill in the graft. There was a palpable radial pulse. Heparin was partially reversed with protamine. We closed the deep layer 3-0 Vicryl the skin closed with 4-0 Vicryl. Dermabond was applied. The patient tolerated the procedure well and was transferred to the recovery room in stable condition. All needle and sponge counts were correct.  Deitra Mayo, MD, FACS [facility]  DATE OF DICTATION:   05/05/2013

## 2013-05-05 NOTE — Anesthesia Preprocedure Evaluation (Addendum)
Anesthesia Evaluation  Patient identified by MRN, date of birth, ID band Patient awake    Reviewed: Allergy & Precautions, H&P , NPO status , Patient's Chart, lab work & pertinent test results, reviewed documented beta blocker date and time   Airway Mallampati: II TM Distance: >3 FB Neck ROM: full    Dental   Pulmonary shortness of breath, COPD breath sounds clear to auscultation        Cardiovascular hypertension, Pt. on medications and Pt. on home beta blockers + CAD Rhythm:regular     Neuro/Psych PSYCHIATRIC DISORDERS Retinopathy  negative neurological ROS     GI/Hepatic negative GI ROS, (+) Hepatitis -, C  Endo/Other  diabetes, Type 2, Insulin DependentMorbid obesityGout  Renal/GU ESRF and DialysisRenal disease   ED negative genitourinary   Musculoskeletal negative musculoskeletal ROS (+)   Abdominal   Peds  Hematology  (+) Blood dyscrasia (plavix), ,   Anesthesia Other Findings See surgeon's H&P   Reproductive/Obstetrics negative OB ROS                           Anesthesia Physical Anesthesia Plan  ASA: III  Anesthesia Plan: General   Post-op Pain Management:    Induction: Intravenous  Airway Management Planned: LMA  Additional Equipment:   Intra-op Plan:   Post-operative Plan:   Informed Consent: I have reviewed the patients History and Physical, chart, labs and discussed the procedure including the risks, benefits and alternatives for the proposed anesthesia with the patient or authorized representative who has indicated his/her understanding and acceptance.   Dental Advisory Given  Plan Discussed with: CRNA and Surgeon  Anesthesia Plan Comments:         Anesthesia Quick Evaluation

## 2013-05-05 NOTE — Progress Notes (Signed)
Dialysis access record faxed to [facility] charles lyles pa paged

## 2013-05-06 ENCOUNTER — Encounter (HOSPITAL_COMMUNITY): Payer: Self-pay | Admitting: Vascular Surgery

## 2013-05-06 DIAGNOSIS — E1129 Type 2 diabetes mellitus with other diabetic kidney complication: Secondary | ICD-10-CM | POA: Diagnosis not present

## 2013-05-13 DIAGNOSIS — B351 Tinea unguium: Secondary | ICD-10-CM | POA: Diagnosis not present

## 2013-05-13 DIAGNOSIS — M79609 Pain in unspecified limb: Secondary | ICD-10-CM | POA: Diagnosis not present

## 2013-05-13 DIAGNOSIS — E1049 Type 1 diabetes mellitus with other diabetic neurological complication: Secondary | ICD-10-CM | POA: Diagnosis not present

## 2013-05-14 DIAGNOSIS — N186 End stage renal disease: Secondary | ICD-10-CM | POA: Diagnosis not present

## 2013-05-15 DIAGNOSIS — N2581 Secondary hyperparathyroidism of renal origin: Secondary | ICD-10-CM | POA: Diagnosis not present

## 2013-05-15 DIAGNOSIS — D509 Iron deficiency anemia, unspecified: Secondary | ICD-10-CM | POA: Diagnosis not present

## 2013-05-15 DIAGNOSIS — N186 End stage renal disease: Secondary | ICD-10-CM | POA: Diagnosis not present

## 2013-05-18 DIAGNOSIS — N186 End stage renal disease: Secondary | ICD-10-CM | POA: Diagnosis not present

## 2013-05-18 DIAGNOSIS — D509 Iron deficiency anemia, unspecified: Secondary | ICD-10-CM | POA: Diagnosis not present

## 2013-05-18 DIAGNOSIS — N2581 Secondary hyperparathyroidism of renal origin: Secondary | ICD-10-CM | POA: Diagnosis not present

## 2013-05-20 DIAGNOSIS — D509 Iron deficiency anemia, unspecified: Secondary | ICD-10-CM | POA: Diagnosis not present

## 2013-05-20 DIAGNOSIS — N186 End stage renal disease: Secondary | ICD-10-CM | POA: Diagnosis not present

## 2013-05-20 DIAGNOSIS — N2581 Secondary hyperparathyroidism of renal origin: Secondary | ICD-10-CM | POA: Diagnosis not present

## 2013-05-22 DIAGNOSIS — N186 End stage renal disease: Secondary | ICD-10-CM | POA: Diagnosis not present

## 2013-05-22 DIAGNOSIS — N2581 Secondary hyperparathyroidism of renal origin: Secondary | ICD-10-CM | POA: Diagnosis not present

## 2013-05-22 DIAGNOSIS — D509 Iron deficiency anemia, unspecified: Secondary | ICD-10-CM | POA: Diagnosis not present

## 2013-05-25 DIAGNOSIS — N186 End stage renal disease: Secondary | ICD-10-CM | POA: Diagnosis not present

## 2013-05-25 DIAGNOSIS — D509 Iron deficiency anemia, unspecified: Secondary | ICD-10-CM | POA: Diagnosis not present

## 2013-05-25 DIAGNOSIS — N2581 Secondary hyperparathyroidism of renal origin: Secondary | ICD-10-CM | POA: Diagnosis not present

## 2013-05-27 DIAGNOSIS — N186 End stage renal disease: Secondary | ICD-10-CM | POA: Diagnosis not present

## 2013-05-27 DIAGNOSIS — D509 Iron deficiency anemia, unspecified: Secondary | ICD-10-CM | POA: Diagnosis not present

## 2013-05-27 DIAGNOSIS — N2581 Secondary hyperparathyroidism of renal origin: Secondary | ICD-10-CM | POA: Diagnosis not present

## 2013-05-29 DIAGNOSIS — D509 Iron deficiency anemia, unspecified: Secondary | ICD-10-CM | POA: Diagnosis not present

## 2013-05-29 DIAGNOSIS — N186 End stage renal disease: Secondary | ICD-10-CM | POA: Diagnosis not present

## 2013-05-29 DIAGNOSIS — N2581 Secondary hyperparathyroidism of renal origin: Secondary | ICD-10-CM | POA: Diagnosis not present

## 2013-06-01 DIAGNOSIS — E7800 Pure hypercholesterolemia, unspecified: Secondary | ICD-10-CM | POA: Diagnosis not present

## 2013-06-01 DIAGNOSIS — N2581 Secondary hyperparathyroidism of renal origin: Secondary | ICD-10-CM | POA: Diagnosis not present

## 2013-06-01 DIAGNOSIS — I10 Essential (primary) hypertension: Secondary | ICD-10-CM | POA: Diagnosis not present

## 2013-06-01 DIAGNOSIS — N186 End stage renal disease: Secondary | ICD-10-CM | POA: Diagnosis not present

## 2013-06-01 DIAGNOSIS — D509 Iron deficiency anemia, unspecified: Secondary | ICD-10-CM | POA: Diagnosis not present

## 2013-06-03 DIAGNOSIS — D509 Iron deficiency anemia, unspecified: Secondary | ICD-10-CM | POA: Diagnosis not present

## 2013-06-03 DIAGNOSIS — N186 End stage renal disease: Secondary | ICD-10-CM | POA: Diagnosis not present

## 2013-06-03 DIAGNOSIS — N2581 Secondary hyperparathyroidism of renal origin: Secondary | ICD-10-CM | POA: Diagnosis not present

## 2013-06-05 DIAGNOSIS — N186 End stage renal disease: Secondary | ICD-10-CM | POA: Diagnosis not present

## 2013-06-05 DIAGNOSIS — N2581 Secondary hyperparathyroidism of renal origin: Secondary | ICD-10-CM | POA: Diagnosis not present

## 2013-06-05 DIAGNOSIS — D509 Iron deficiency anemia, unspecified: Secondary | ICD-10-CM | POA: Diagnosis not present

## 2013-06-08 ENCOUNTER — Encounter (HOSPITAL_COMMUNITY): Payer: Self-pay

## 2013-06-08 ENCOUNTER — Emergency Department (HOSPITAL_COMMUNITY): Payer: Medicare Other

## 2013-06-08 ENCOUNTER — Inpatient Hospital Stay (HOSPITAL_COMMUNITY)
Admission: EM | Admit: 2013-06-08 | Discharge: 2013-06-12 | DRG: 280 | Disposition: A | Discharge status: Home / Self Care | Payer: Medicare Other | Attending: Internal Medicine | Admitting: Internal Medicine

## 2013-06-08 DIAGNOSIS — Z113 Encounter for screening for infections with a predominantly sexual mode of transmission: Secondary | ICD-10-CM | POA: Diagnosis not present

## 2013-06-08 DIAGNOSIS — J9601 Acute respiratory failure with hypoxia: Secondary | ICD-10-CM

## 2013-06-08 DIAGNOSIS — Z794 Long term (current) use of insulin: Secondary | ICD-10-CM | POA: Diagnosis not present

## 2013-06-08 DIAGNOSIS — E1122 Type 2 diabetes mellitus with diabetic chronic kidney disease: Secondary | ICD-10-CM | POA: Diagnosis present

## 2013-06-08 DIAGNOSIS — B1920 Unspecified viral hepatitis C without hepatic coma: Secondary | ICD-10-CM | POA: Diagnosis present

## 2013-06-08 DIAGNOSIS — M109 Gout, unspecified: Secondary | ICD-10-CM | POA: Diagnosis present

## 2013-06-08 DIAGNOSIS — A419 Sepsis, unspecified organism: Secondary | ICD-10-CM | POA: Diagnosis present

## 2013-06-08 DIAGNOSIS — E875 Hyperkalemia: Secondary | ICD-10-CM

## 2013-06-08 DIAGNOSIS — R0600 Dyspnea, unspecified: Secondary | ICD-10-CM

## 2013-06-08 DIAGNOSIS — I255 Ischemic cardiomyopathy: Secondary | ICD-10-CM

## 2013-06-08 DIAGNOSIS — I214 Non-ST elevation (NSTEMI) myocardial infarction: Secondary | ICD-10-CM | POA: Diagnosis not present

## 2013-06-08 DIAGNOSIS — E1169 Type 2 diabetes mellitus with other specified complication: Secondary | ICD-10-CM | POA: Diagnosis not present

## 2013-06-08 DIAGNOSIS — J9620 Acute and chronic respiratory failure, unspecified whether with hypoxia or hypercapnia: Secondary | ICD-10-CM | POA: Diagnosis present

## 2013-06-08 DIAGNOSIS — T8571XA Infection and inflammatory reaction due to peritoneal dialysis catheter, initial encounter: Secondary | ICD-10-CM | POA: Diagnosis present

## 2013-06-08 DIAGNOSIS — I2589 Other forms of chronic ischemic heart disease: Secondary | ICD-10-CM | POA: Diagnosis present

## 2013-06-08 DIAGNOSIS — J9600 Acute respiratory failure, unspecified whether with hypoxia or hypercapnia: Secondary | ICD-10-CM | POA: Diagnosis not present

## 2013-06-08 DIAGNOSIS — E662 Morbid (severe) obesity with alveolar hypoventilation: Secondary | ICD-10-CM | POA: Diagnosis present

## 2013-06-08 DIAGNOSIS — J90 Pleural effusion, not elsewhere classified: Secondary | ICD-10-CM | POA: Diagnosis not present

## 2013-06-08 DIAGNOSIS — E119 Type 2 diabetes mellitus without complications: Secondary | ICD-10-CM | POA: Diagnosis not present

## 2013-06-08 DIAGNOSIS — J811 Chronic pulmonary edema: Secondary | ICD-10-CM

## 2013-06-08 DIAGNOSIS — I5031 Acute diastolic (congestive) heart failure: Secondary | ICD-10-CM | POA: Diagnosis not present

## 2013-06-08 DIAGNOSIS — I5023 Acute on chronic systolic (congestive) heart failure: Secondary | ICD-10-CM

## 2013-06-08 DIAGNOSIS — N2581 Secondary hyperparathyroidism of renal origin: Secondary | ICD-10-CM | POA: Diagnosis present

## 2013-06-08 DIAGNOSIS — E1165 Type 2 diabetes mellitus with hyperglycemia: Secondary | ICD-10-CM | POA: Diagnosis present

## 2013-06-08 DIAGNOSIS — R7881 Bacteremia: Secondary | ICD-10-CM

## 2013-06-08 DIAGNOSIS — E782 Mixed hyperlipidemia: Secondary | ICD-10-CM

## 2013-06-08 DIAGNOSIS — R0602 Shortness of breath: Secondary | ICD-10-CM | POA: Diagnosis not present

## 2013-06-08 DIAGNOSIS — R9431 Abnormal electrocardiogram [ECG] [EKG]: Secondary | ICD-10-CM

## 2013-06-08 DIAGNOSIS — E8770 Fluid overload, unspecified: Secondary | ICD-10-CM

## 2013-06-08 DIAGNOSIS — I120 Hypertensive chronic kidney disease with stage 5 chronic kidney disease or end stage renal disease: Secondary | ICD-10-CM | POA: Diagnosis not present

## 2013-06-08 DIAGNOSIS — T827XXA Infection and inflammatory reaction due to other cardiac and vascular devices, implants and grafts, initial encounter: Secondary | ICD-10-CM

## 2013-06-08 DIAGNOSIS — Z992 Dependence on renal dialysis: Secondary | ICD-10-CM

## 2013-06-08 DIAGNOSIS — I2510 Atherosclerotic heart disease of native coronary artery without angina pectoris: Secondary | ICD-10-CM | POA: Diagnosis not present

## 2013-06-08 DIAGNOSIS — H3500 Unspecified background retinopathy: Secondary | ICD-10-CM | POA: Diagnosis present

## 2013-06-08 DIAGNOSIS — I5043 Acute on chronic combined systolic (congestive) and diastolic (congestive) heart failure: Principal | ICD-10-CM | POA: Diagnosis present

## 2013-06-08 DIAGNOSIS — R748 Abnormal levels of other serum enzymes: Secondary | ICD-10-CM

## 2013-06-08 DIAGNOSIS — J4489 Other specified chronic obstructive pulmonary disease: Secondary | ICD-10-CM | POA: Diagnosis present

## 2013-06-08 DIAGNOSIS — I509 Heart failure, unspecified: Secondary | ICD-10-CM | POA: Diagnosis not present

## 2013-06-08 DIAGNOSIS — J449 Chronic obstructive pulmonary disease, unspecified: Secondary | ICD-10-CM | POA: Diagnosis present

## 2013-06-08 DIAGNOSIS — Z87891 Personal history of nicotine dependence: Secondary | ICD-10-CM

## 2013-06-08 DIAGNOSIS — N186 End stage renal disease: Secondary | ICD-10-CM

## 2013-06-08 DIAGNOSIS — R7989 Other specified abnormal findings of blood chemistry: Secondary | ICD-10-CM

## 2013-06-08 DIAGNOSIS — D649 Anemia, unspecified: Secondary | ICD-10-CM | POA: Diagnosis present

## 2013-06-08 DIAGNOSIS — R0609 Other forms of dyspnea: Secondary | ICD-10-CM | POA: Diagnosis not present

## 2013-06-08 DIAGNOSIS — E162 Hypoglycemia, unspecified: Secondary | ICD-10-CM | POA: Diagnosis not present

## 2013-06-08 DIAGNOSIS — B957 Other staphylococcus as the cause of diseases classified elsewhere: Secondary | ICD-10-CM | POA: Diagnosis not present

## 2013-06-08 DIAGNOSIS — J9819 Other pulmonary collapse: Secondary | ICD-10-CM | POA: Diagnosis present

## 2013-06-08 DIAGNOSIS — I38 Endocarditis, valve unspecified: Secondary | ICD-10-CM | POA: Diagnosis not present

## 2013-06-08 DIAGNOSIS — I770 Arteriovenous fistula, acquired: Secondary | ICD-10-CM

## 2013-06-08 LAB — PRO B NATRIURETIC PEPTIDE: Pro B Natriuretic peptide (BNP): 12766 pg/mL — ABNORMAL HIGH (ref 0–125)

## 2013-06-08 LAB — TROPONIN I
Troponin I: 0.56 ng/mL (ref <0.30)
Troponin I: 0.55 ng/mL (ref <0.30)
Troponin I: 0.88 ng/mL (ref <0.30)
Troponin I: 0.61 ng/mL (ref <0.30)

## 2013-06-08 LAB — MRSA PCR SCREENING: MRSA by PCR: NEGATIVE

## 2013-06-08 LAB — CBC
MCHC: 33.7 g/dL (ref 30.0–36.0)
Platelets: 259 10*3/uL (ref 150–400)
RDW: 14.4 % (ref 11.5–15.5)

## 2013-06-08 LAB — POCT I-STAT, CHEM 8
BUN: 52 mg/dL — ABNORMAL HIGH (ref 6–23)
Chloride: 102 mEq/L (ref 96–112)
HCT: 37 % — ABNORMAL LOW (ref 39.0–52.0)
Sodium: 135 mEq/L (ref 135–145)
TCO2: 27 mmol/L (ref 0–100)

## 2013-06-08 LAB — POCT I-STAT 3, ART BLOOD GAS (G3+)
Bicarbonate: 27.3 mEq/L — ABNORMAL HIGH (ref 20.0–24.0)
pH, Arterial: 7.359 (ref 7.350–7.450)
pO2, Arterial: 114 mmHg — ABNORMAL HIGH (ref 80.0–100.0)

## 2013-06-08 LAB — GLUCOSE, CAPILLARY
Glucose-Capillary: 86 mg/dL (ref 70–99)
Glucose-Capillary: 100 mg/dL — ABNORMAL HIGH (ref 70–99)

## 2013-06-08 LAB — BASIC METABOLIC PANEL
CO2: 22 mEq/L (ref 19–32)
Glucose, Bld: 143 mg/dL — ABNORMAL HIGH (ref 70–99)
Potassium: 5.4 mEq/L — ABNORMAL HIGH (ref 3.5–5.1)
Sodium: 136 mEq/L (ref 135–145)

## 2013-06-08 LAB — CK TOTAL AND CKMB (NOT AT ARMC): Total CK: 209 U/L (ref 7–232)

## 2013-06-08 MED ORDER — AMITRIPTYLINE HCL 25 MG PO TABS
25.0000 mg | ORAL_TABLET | Freq: Every day | ORAL | Status: DC
  Administered 2013-06-08 – 2013-06-11 (×4): 25 mg via ORAL
  Filled 2013-06-08 (×5): qty 1

## 2013-06-08 MED ORDER — SEVELAMER CARBONATE 800 MG PO TABS
2400.0000 mg | ORAL_TABLET | Freq: Three times a day (TID) | ORAL | Status: DC
  Administered 2013-06-08 – 2013-06-12 (×9): 2400 mg via ORAL
  Filled 2013-06-08 (×17): qty 3

## 2013-06-08 MED ORDER — RENA-VITE PO TABS
1.0000 | ORAL_TABLET | Freq: Every day | ORAL | Status: DC
  Administered 2013-06-08 – 2013-06-12 (×4): 1 via ORAL
  Filled 2013-06-08 (×5): qty 1

## 2013-06-08 MED ORDER — ACETAMINOPHEN 325 MG PO TABS: 650.0000 mg | ORAL_TABLET | Freq: Four times a day (QID) | ORAL | Status: DC | PRN

## 2013-06-08 MED ORDER — CALCIUM GLUCONATE 10 % IV SOLN
1.0000 g | Freq: Once | INTRAVENOUS | Status: AC
  Administered 2013-06-08: 1 g via INTRAVENOUS
  Filled 2013-06-08: qty 10

## 2013-06-08 MED ORDER — ONDANSETRON HCL 4 MG/2ML IJ SOLN: 4.0000 mg | Freq: Four times a day (QID) | INTRAMUSCULAR | Status: DC | PRN

## 2013-06-08 MED ORDER — DEXTROSE 50 % IV SOLN: 25.0000 mL | Freq: Once | INTRAVENOUS | Status: AC

## 2013-06-08 MED ORDER — LEVOFLOXACIN IN D5W 750 MG/150ML IV SOLN
750.0000 mg | Freq: Once | INTRAVENOUS | Status: AC
  Administered 2013-06-08: 750 mg via INTRAVENOUS
  Filled 2013-06-08: qty 150

## 2013-06-08 MED ORDER — INSULIN ASPART 100 UNIT/ML ~~LOC~~ SOLN
15.0000 [IU] | Freq: Three times a day (TID) | SUBCUTANEOUS | Status: DC
  Administered 2013-06-09 – 2013-06-12 (×5): 15 [IU] via SUBCUTANEOUS

## 2013-06-08 MED ORDER — SIMVASTATIN 20 MG PO TABS
20.0000 mg | ORAL_TABLET | Freq: Every day | ORAL | Status: DC
  Administered 2013-06-08 – 2013-06-11 (×4): 20 mg via ORAL
  Filled 2013-06-08 (×5): qty 1

## 2013-06-08 MED ORDER — SODIUM POLYSTYRENE SULFONATE 15 GM/60ML PO SUSP: 15.0000 g | Freq: Once | ORAL | Status: DC

## 2013-06-08 MED ORDER — ASPIRIN 81 MG PO CHEW
324.0000 mg | CHEWABLE_TABLET | Freq: Once | ORAL | Status: AC
  Administered 2013-06-08: 324 mg via ORAL
  Filled 2013-06-08: qty 4

## 2013-06-08 MED ORDER — CINACALCET HCL 30 MG PO TABS
60.0000 mg | ORAL_TABLET | Freq: Every day | ORAL | Status: DC
  Administered 2013-06-08 – 2013-06-12 (×4): 60 mg via ORAL
  Filled 2013-06-08 (×6): qty 2

## 2013-06-08 MED ORDER — CALCIUM ACETATE 667 MG PO CAPS
667.0000 mg | ORAL_CAPSULE | Freq: Three times a day (TID) | ORAL | Status: DC
  Administered 2013-06-08 – 2013-06-12 (×9): 667 mg via ORAL
  Filled 2013-06-08 (×16): qty 1

## 2013-06-08 MED ORDER — ASPIRIN 325 MG PO TABS
325.0000 mg | ORAL_TABLET | Freq: Every day | ORAL | Status: DC
  Administered 2013-06-08 – 2013-06-12 (×4): 325 mg via ORAL
  Filled 2013-06-08 (×5): qty 1

## 2013-06-08 MED ORDER — HEPARIN SODIUM (PORCINE) 5000 UNIT/ML IJ SOLN
5000.0000 [IU] | Freq: Three times a day (TID) | INTRAMUSCULAR | Status: DC
  Administered 2013-06-08 – 2013-06-12 (×9): 5000 [IU] via SUBCUTANEOUS
  Filled 2013-06-08 (×15): qty 1

## 2013-06-08 MED ORDER — HEPARIN SODIUM (PORCINE) 5000 UNIT/ML IJ SOLN
4000.0000 [IU] | Freq: Once | INTRAMUSCULAR | Status: AC
  Administered 2013-06-08: 4000 [IU] via INTRAVENOUS

## 2013-06-08 MED ORDER — HEPARIN (PORCINE) IN NACL 100-0.45 UNIT/ML-% IJ SOLN
1250.0000 [IU]/h | INTRAMUSCULAR | Status: DC
  Administered 2013-06-08: 1250 [IU]/h via INTRAVENOUS
  Filled 2013-06-08 (×2): qty 250

## 2013-06-08 MED ORDER — INSULIN ASPART 100 UNIT/ML IV SOLN
8.0000 [IU] | Freq: Once | INTRAVENOUS | Status: DC
  Filled 2013-06-08: qty 0.08

## 2013-06-08 MED ORDER — INSULIN NPH (HUMAN) (ISOPHANE) 100 UNIT/ML ~~LOC~~ SUSP
20.0000 [IU] | Freq: Two times a day (BID) | SUBCUTANEOUS | Status: DC
  Administered 2013-06-08 – 2013-06-11 (×6): 20 [IU] via SUBCUTANEOUS
  Filled 2013-06-08 (×2): qty 10

## 2013-06-08 MED ORDER — METOPROLOL TARTRATE 50 MG PO TABS
50.0000 mg | ORAL_TABLET | Freq: Every day | ORAL | Status: DC
  Filled 2013-06-08: qty 1

## 2013-06-08 MED ORDER — DOXERCALCIFEROL 4 MCG/2ML IV SOLN
2.0000 ug | INTRAVENOUS | Status: DC
  Filled 2013-06-08 (×4): qty 2

## 2013-06-08 MED ORDER — DOXERCALCIFEROL 4 MCG/2ML IV SOLN
INTRAVENOUS | Status: AC
  Administered 2013-06-08: 2 ug via INTRAVENOUS
  Filled 2013-06-08: qty 2

## 2013-06-08 MED ORDER — CLOPIDOGREL BISULFATE 75 MG PO TABS
75.0000 mg | ORAL_TABLET | Freq: Every day | ORAL | Status: DC
  Administered 2013-06-08 – 2013-06-12 (×4): 75 mg via ORAL
  Filled 2013-06-08 (×5): qty 1

## 2013-06-08 MED ORDER — CARVEDILOL 6.25 MG PO TABS
6.2500 mg | ORAL_TABLET | Freq: Two times a day (BID) | ORAL | Status: DC
  Administered 2013-06-08 – 2013-06-09 (×3): 6.25 mg via ORAL
  Filled 2013-06-08 (×6): qty 1

## 2013-06-08 MED ORDER — ALLOPURINOL 100 MG PO TABS
100.0000 mg | ORAL_TABLET | Freq: Every day | ORAL | Status: DC
  Administered 2013-06-08 – 2013-06-12 (×4): 100 mg via ORAL
  Filled 2013-06-08 (×5): qty 1

## 2013-06-08 MED ORDER — INSULIN ASPART 100 UNIT/ML IV SOLN
10.0000 [IU] | Freq: Once | INTRAVENOUS | Status: DC
  Filled 2013-06-08: qty 0.1

## 2013-06-08 MED ORDER — ALBUTEROL (5 MG/ML) CONTINUOUS INHALATION SOLN
10.0000 mg/h | INHALATION_SOLUTION | Freq: Once | RESPIRATORY_TRACT | Status: AC
  Administered 2013-06-08: 10 mg/h via RESPIRATORY_TRACT
  Filled 2013-06-08: qty 20

## 2013-06-08 MED ORDER — NITROGLYCERIN 2 % TD OINT: 1.0000 [in_us] | TOPICAL_OINTMENT | Freq: Once | TRANSDERMAL | Status: DC

## 2013-06-08 MED ORDER — COLCHICINE 0.6 MG PO TABS
0.6000 mg | ORAL_TABLET | Freq: Every day | ORAL | Status: DC | PRN
  Filled 2013-06-08: qty 1

## 2013-06-08 MED ORDER — ALBUTEROL SULFATE HFA 108 (90 BASE) MCG/ACT IN AERS: 2.0000 | INHALATION_SPRAY | Freq: Four times a day (QID) | RESPIRATORY_TRACT | Status: DC | PRN

## 2013-06-08 NOTE — ED Notes (Signed)
Patient asked for and received a pair of socks. Placed on patient's feet.

## 2013-06-08 NOTE — Progress Notes (Signed)
Patient transported in BiPAP to room 2H16 without incident.

## 2013-06-08 NOTE — Care Management Note (Addendum)
    Page 1 of 2   06/09/2013     10:24:19 AM   CARE MANAGEMENT NOTE 06/09/2013  Patient:  NOYAN, TOLEDO   Account Number:  [id number]  Date Initiated:  06/08/2013  Documentation initiated by:  Elissa Hefty  Subjective/Objective Assessment:   adm w resp failure, mi     Action/Plan:   lives w wife, pcp dr Moshe Cipro   Anticipated DC Date:     Anticipated DC Plan:  [place]  CM consult      [place]   Choice offered to / List presented to:  C-1 Patient        [place] arranged  HH-1 RN  [place]      [place].   Status of service:   Medicare Important Message given?   (If response is "NO", the following Medicare IM given date fields will be blank) Date Medicare IM given:   Date Additional Medicare IM given:    Discharge Disposition:  [place]  Per UR Regulation:  Reviewed for med. necessity/level of care/duration of stay  If discussed at [place] of Stay Meetings, dates discussed:    Comments:  8/26 1020a debbie Savhanna Sliva rn,bsn spoke w pt and gave him [facility]hc agency list. no pref. he lives w disabled wife who will be 42 in oct, [place] who is 69 but doesn't work and is slow he states. da helps w meals. ref to donna w [facility] for rn-pt-aid-sw. wife and da have orange card but will get home sw to see if fam qual for any other assist.

## 2013-06-08 NOTE — Consult Note (Signed)
[facility] Renal Consultation Note    Indication for Consultation:  Management of ESRD/hemodialysis; anemia, hypertension/volume and secondary hyperparathyroidism  HPI: Ernest [name] is a 67 y.o. male ESRD patient (MWF HD) with past medical history significant for DM type II, CAD, hyptertension and obesity who presented to the ED early this morning with complaints of worsening shortness of breath and orthopnea over the weekend.  He states that he did not feel well after completion of dialysis on Friday, but had no specific symptoms until later that evening.  He was only able to sleep sitting up, his dyspnea continued to worsen and he eventually became diaphoretic which prompted the need to seek medical attention. He was transported via EMS and found to be in acute respiratory failure with 02 saturation of 86%,  pulmonary edema on chest x-ray, hyperkalemic and with elevated troponin.  He was placed on BiPAP and evaluated by cardiology who determined that his symptoms were related to volume overload and saw no indication for an ischemia work-up.  At the time of this encounter, he is resting comfortably with family at the bedside.  His breathing is unlabored and sats are 100% on 4L via nasal cannula.  Mr. Nohr dialyzes at the [facility] on MWF. His last treatment was 8/22 and he left at his dry weight.   Past Medical History  Diagnosis Date  . Abnormal electrocardiogram   . Hyperlipidemia   . Retinopathy   . Gout   . ESRD (end stage renal disease)     on hemodialysis Mon, Wed, Fri - followed by [name]  . Type II or unspecified type diabetes mellitus without mention of complication, not stated as uncontrolled     adult onset  . Erectile dysfunction     penile implant  . Abnormal stress test     s/p cath November 2013 with modest disease involving the ostial left main, proximal LAD, proximal RCA - do not appear to be hemodynamically signficant; mild  LV dysfunction  . Obesity   . Shortness of breath     with exertion  . Hypertension   . Hepatitis C   . COPD (chronic obstructive pulmonary disease)   . Coronary artery disease     per cath report 2013   Past Surgical History  Procedure Laterality Date  . Penile prosthesis implant    . Fistula      RUE and wrist  . Cardiac catheterization  August 26, 2012  . Eye surgery      laser.  and surgery for DM  . Insertion of dialysis catheter Right 01/01/2013    Procedure: INSERTION OF DIALYSIS CATHETER right  internal jugular;  Surgeon: Serafina Mitchell, MD;  Location: [place];  Service: Vascular;  Laterality: Right;  . Av fistula placement Left 01/13/2013    Procedure: INSERTION OF ARTERIOVENOUS (AV) GORE-TEX GRAFT ARM;  Surgeon: Angelia Mould, MD;  Location: [place];  Service: Vascular;  Laterality: Left;  . Thrombectomy w/ embolectomy Left 03/26/2013    Procedure: Attempted thrombectomy of left arm arteriovenous goretex graft.;  Surgeon: Angelia Mould, MD;  Location: [place];  Service: Vascular;  Laterality: Left;  . Insertion of dialysis catheter N/A 03/26/2013    Procedure: INSERTION OF DIALYSIS CATHETER Left internal jugular vein;  Surgeon: Angelia Mould, MD;  Location: [place];  Service: Vascular;  Laterality: N/A;  . Avgg removal Left 03/26/2013    Procedure: REMOVAL OF  NON INCORPORATED ARTERIOVENOUS GORETEX GRAFT (  [place]) left arm * repair of  left brachial artery with vein patch angioplasty.;  Surgeon: Angelia Mould, MD;  Location: [place];  Service: Vascular;  Laterality: Left;  . Av fistula placement Left 05/05/2013    Procedure: INSERTION OF LEFT UPPER ARM  ARTERIOVENOUS GORTEX GRAFT;  Surgeon: Angelia Mould, MD;  Location: [facility] OR;  Service: Vascular;  Laterality: Left;   Family History  Problem Relation Age of Onset  . Heart disease Father    Social History:  reports that he quit smoking about 40 years ago. He has never used smokeless tobacco. He  reports that he does not drink alcohol or use illicit drugs. [name] Prior to Admission medications   Medication Sig Start Date End Date Taking? Authorizing Provider  albuterol (PROVENTIL HFA;VENTOLIN HFA) 108 (90 BASE) MCG/ACT inhaler Inhale 2 puffs into the lungs every 6 (six) hours as needed for wheezing.   Yes Historical Provider, MD  allopurinol (ZYLOPRIM) 100 MG tablet Take 100 mg by mouth daily.    Yes Historical Provider, MD  amitriptyline (ELAVIL) 25 MG tablet Take 25 mg by mouth at bedtime.   Yes Historical Provider, MD  Calcium Acetate 667 MG TABS Take 1 tablet by mouth 3 (three) times daily with meals.    Yes Historical Provider, MD  cinacalcet (SENSIPAR) 60 MG tablet Take 60 mg by mouth daily.   Yes Historical Provider, MD  clopidogrel (PLAVIX) 75 MG tablet Take 75 mg by mouth daily.    Yes Historical Provider, MD  colchicine 0.6 MG tablet Take 0.6 mg by mouth daily as needed (for gout flare-ups).    Yes Historical Provider, MD  fluticasone (FLONASE) 50 MCG/ACT nasal spray Place 2 sprays into the nose at bedtime.   Yes Historical Provider, MD  insulin NPH (HUMULIN N,NOVOLIN N) 100 UNIT/ML injection Inject 20 Units into the skin 2 (two) times daily.    Yes Historical Provider, MD  insulin regular (NOVOLIN R,HUMULIN R) 100 units/mL injection Inject 40 Units into the skin 3 (three) times daily before meals.    Yes Historical Provider, MD  losartan (COZAAR) 50 MG tablet Take 50 mg by mouth daily.   Yes Historical Provider, MD  metoprolol (LOPRESSOR) 50 MG tablet Take 50 mg by mouth every evening.    Yes Historical Provider, MD  multivitamin (RENA-VIT) TABS tablet Take 1 tablet by mouth daily.     Yes Historical Provider, MD  sevelamer (RENVELA) 800 MG tablet Take 2,400 mg by mouth 3 (three) times daily with meals.    Yes Historical Provider, MD  simvastatin (ZOCOR) 20 MG tablet Take 20 mg by mouth at bedtime.     Yes Historical Provider, MD   Current Facility-Administered  Medications  Medication Dose Route Frequency Provider Last Rate Last Dose  . acetaminophen (TYLENOL) tablet 650 mg  650 mg Oral Q6H PRN [name], MD      . albuterol (PROVENTIL HFA;VENTOLIN HFA) 108 (90 BASE) MCG/ACT inhaler 2 puff  2 puff Inhalation Q6H PRN [name], MD      . allopurinol (ZYLOPRIM) tablet 100 mg  100 mg Oral Daily [name], MD      . amitriptyline (ELAVIL) tablet 25 mg  25 mg Oral QHS [name], MD      . calcium acetate (PHOSLO) capsule 667 mg  667 mg Oral TID WC [name], [name]      . carvedilol (COREG) tablet 6.25 mg  6.25 mg Oral BID WC Josue Hector,  MD      . cinacalcet (SENSIPAR) tablet 60 mg  60 mg Oral Q breakfast [name], MD      . clopidogrel (PLAVIX) tablet 75 mg  75 mg Oral QAC breakfast [name], MD      . colchicine tablet 0.6 mg  0.6 mg Oral Daily PRN [name], MD      . dextrose 50 % solution 25 mL  25 mL Intravenous Once [name], MD      . heparin ADULT infusion 100 units/mL (25000 units/250 mL)  1,250 Units/hr Intravenous Continuous Eudelia Bunch, RPH 12.5 mL/hr at 06/08/13 0444 1,250 Units/hr at 06/08/13 0444  . insulin aspart (novoLOG) injection 10 Units  10 Units Intravenous Once [name], MD      . insulin aspart (novoLOG) injection 15 Units  15 Units Subcutaneous TID WC [name], MD      . insulin NPH (HUMULIN N,NOVOLIN N) injection 20 Units  20 Units Subcutaneous BID AC & HS [name], MD      . multivitamin (RENA-VIT) tablet 1 tablet  1 tablet Oral Daily [name], MD      . nitroGLYCERIN (NITROGLYN) 2 % ointment 1 inch  1 inch Topical Once Mariea Clonts, MD      . ondansetron [facility]) injection 4 mg  4 mg Intravenous Q6H PRN [name], MD      . sevelamer carbonate (RENVELA) tablet 2,400 mg  2,400 mg Oral TID WC [name], MD      . simvastatin (ZOCOR) tablet 20 mg  20 mg Oral QHS [name], MD       Labs: Basic Metabolic Panel:  Recent Labs Lab  06/08/13 0241 06/08/13 0438  NA 136 135  K 5.4* 6.5*  CL 94* 102  CO2 22  --   GLUCOSE 143* 187*  BUN 51* 52*  CREATININE 10.63* 10.90*  CALCIUM 9.7  --    Liver Function Tests: No results found for this basename: AST, ALT, ALKPHOS, BILITOT, PROT, ALBUMIN,  in the last 168 hours No results found for this basename: LIPASE, AMYLASE,  in the last 168 hours No results found for this basename: AMMONIA,  in the last 168 hours CBC:  Recent Labs Lab 06/08/13 0240 06/08/13 0438  WBC 13.0*  --   HGB 11.5* 12.6*  HCT 34.1* 37.0*  MCV 91.2  --   PLT 259  --    Cardiac Enzymes:  Recent Labs Lab 06/08/13 0241 06/08/13 0437  CKTOTAL  --  209  CKMB  --  6.9*  TROPONINI 0.49* 0.56*   CBG:  Recent Labs Lab 06/08/13 0204  GLUCAP 86   Studies/Results: Dg Chest Portable 1 View  06/08/2013   *RADIOLOGY REPORT*  Clinical Data: Shortness of breath  PORTABLE CHEST - 1 VIEW  Comparison: 03/26/2013  Findings: Heart size upper normal to mildly enlarged with central vascular congestion.  Interstitial prominence and perihilar increased markings.  Left lung consolidation.  Layering pleural effusions not excluded.  Multilevel degenerative change.  No pneumothorax.  Left sided large bore catheter with tips that project over the cavoatrial junction.  No interval osseous change.  IMPRESSION: Central vascular congestion and pulmonary edema pattern.  Left lung base opacity; atelectasis versus infiltrate.   Original Report Authenticated By: Carlos Levering, M.D.    ROS: 12 pt ROS asked and answered. All systems negative except as above.   Physical Exam: Filed Vitals:   06/08/13 0556 06/08/13 0600 06/08/13 0630 06/08/13 0800  BP:   152/54 147/52  Pulse: 106 102 107   Temp:   98 F (36.7 C) 98.3 F (36.8 C)  TempSrc:   Oral Oral  Resp: 23 19 16 17   Height:  5\' 9"  (1.753 m)    Weight:  118 kg (260 lb 2.3 oz)    SpO2: 100% 100% 100% 100%     General: Alert, cooperative, well nourished, in  no acute distress. Head: Normocephalic, atraumatic, sclera non-icteric, mucus membranes are moist Neck: Supple. JVD elevated Lungs: Diminished BS bilaterally. No wheezes, rales, or rhonchi. Breathing is unlabored on 4L 02 via nasal cannula poor chest excursion and poor air movement througout Heart: Tachy regular with S1 S2. No murmurs, rubs, or gallops appreciated. Abdomen: Obese, non-tender, non-distended with normoactive bowel sounds. No rebound/guarding. No obvious abdominal masses. Liver down 4cm, protuberant M-S:  Strength and tone appear normal for age. Lower extremities: Trace LE  edema or ischemic changes, no open wounds  Neuro: Alert and oriented X 3. Moves all extremities spontaneously. Psych:  Responds to questions appropriately with a normal affect. Dialysis Access: L IJ PC/ L AVG + bruit  Dialysis Orders: [facility] . EDW 117.5 kg HD Bath 2K/2Ca Time 4:15 Heparin 6000 bolus/ 3000 mid. Access Lt PC/  Mature L AVG placed 7/22, BFR 400 DFR A 1.5   Hectorol 2 mcg IV/HD Epogen 0   Units IV/HD  Venofer  0 Recent labs:  Hgb 12.6, Phos 6.7, PTH 263.8 Tsat 34% on 7/23  Assessment/Plan: 1. Acute diastolic CHF/ Volume overload - Cards following. ^ Pro BNP with pulm edema on CXR. Elevated troponin likely secondary to volume excess. Also, some component of sleep apnea and obesity hypoventilation. No plans for cath at this time.  HD with UF of 4L planned for this morning. Will evaluate need for serial HD post treatment.  Will likely need a lower EDW. Echo pending. 2. Hyperkalemia - K+ 6.5. Emergent HD this a.m with 1K bath x 1 hour then resume 2K bath. Kayexalate d/c'd. 3. ESRD -  Continue MWF via L AVG placed 7/22. Has been used x 3 with no issues per pt. No heparin with HD, on gtt 4. Hypertension -  SBPs 140s-150s on Coreg BID. Monitor 5. Anemia  - Hgb 12.6, no op ESA's or iron 6. Metabolic bone disease -  Ca 9.7. Phos poorly controlled op on Renvela 3 and phoslo 1 ac. Last PTH  263.8. Continue binders, Vit D and Sensipar 60 7. Nutrition - NPO for now. Renal diet when appropriate. Multivitamin 8. DM Type II - on insulin 9. CAD - moderate by cath in 2013  Sonnie Alamo, [name] [place] Pager [phone] 06/08/2013, 8:29 AM   Patient seen and examined.  I agree with assessment and plan as above with additions as indicated.  68 yo with ESRD on dialysis since 2009 presenting with acute pulm edema and SOB.  Not sure if this is due to lean body weight loss or primary cardiac issue (i.e., ischemia).  He is at dry wt but BP is up and tolerating fluid removal so far with HD.  Recommendations as above, will follow.  Kelly Splinter  MD Pager [phone]    Cell  [phone] 06/08/2013, 10:18 AM

## 2013-06-08 NOTE — ED Notes (Signed)
Critical troponin results shown to Dr.Zavitz and [employer]

## 2013-06-08 NOTE — Progress Notes (Signed)
ANTICOAGULATION CONSULT NOTE - Follow Up Consult  Pharmacy Consult for heparin Indication: chest pain/ACS  [name]  Patient Measurements: Height: 5\' 9"  (175.3 cm) Weight: 265 lb 14 oz (120.6 kg) IBW/kg (Calculated) : 70.7 Heparin Dosing Weight: 89kg  Vital Signs: Temp: 98.2 F (36.8 C) (08/25 0901) Temp src: Oral (08/25 0901) BP: 129/41 mmHg (08/25 1100) Pulse Rate: 106 (08/25 1030)  Labs:  Recent Labs  06/08/13 0240 06/08/13 0241 06/08/13 0437 06/08/13 0438 06/08/13 0826 06/08/13 1035  HGB 11.5*  --   --  12.6*  --   --   HCT 34.1*  --   --  37.0*  --   --   PLT 259  --   --   --   --   --   HEPARINUNFRC  --   --   --   --   --  0.64  CREATININE  --  10.63*  --  10.90*  --   --   CKTOTAL  --   --  209  --   --   --   CKMB  --   --  6.9*  --   --   --   TROPONINI  --  0.49* 0.56*  --  0.55*  --     Estimated Creatinine Clearance: 8.8 ml/min (by C-G formula based on Cr of 10.9).   Medications:  Scheduled:  . allopurinol  100 mg Oral Daily  . amitriptyline  25 mg Oral QHS  . calcium acetate  667 mg Oral TID WC  . carvedilol  6.25 mg Oral BID WC  . cinacalcet  60 mg Oral Q breakfast  . clopidogrel  75 mg Oral QAC breakfast  . dextrose  25 mL Intravenous Once  . doxercalciferol      . doxercalciferol  2 mcg Intravenous Q M,W,F-HD  . insulin aspart  10 Units Intravenous Once  . insulin aspart  15 Units Subcutaneous TID WC  . insulin NPH  20 Units Subcutaneous BID AC & HS  . multivitamin  1 tablet Oral Daily  . nitroGLYCERIN  1 inch Topical Once  . sevelamer carbonate  2,400 mg Oral TID WC  . simvastatin  20 mg Oral QHS   Infusions:  . heparin 1,250 Units/hr (06/08/13 0444)    Assessment: 67 yo male here with HF and elevated troponins. Patient in on heparin at 1250 units/hr and at goal (HL=0.64). No plans for cath now.  Goal of Therapy:  Heparin level 0.3-0.7 units/ml Monitor platelets by anticoagulation protocol: Yes   Plan:  -No heparin  changes needed -Will recheck a heparin level later today to confirm  Hildred Laser, Pharm D 06/08/2013 11:14 AM

## 2013-06-08 NOTE — Progress Notes (Signed)
**Note De-Identified  Obfuscation** RT note: RN removed patient from BIPAP at 0800 and placed on 4LNC, VS WNL. RT to cont. To monitor.

## 2013-06-08 NOTE — Procedures (Signed)
I was present at this dialysis session. I have reviewed the session itself and made appropriate changes.   Kelly Splinter  MD Pager [phone]    Cell  [phone] 06/08/2013, 11:31 AM

## 2013-06-08 NOTE — Progress Notes (Signed)
[facility] Progress Note [place] TEAM 1 - Stepdown ICU Team   Ernest [name] [id number] DOB: [birth year] DOA: 06/08/2013 PCP: Louis Meckel, MD  Brief narrative: 67 y.o. male with history of ESRD on hemodialysis MWF, moderate CAD, diabetes on insulin, and hypertension who presented to the ER with complaints of SOB. He completed his dialysis on Friday, subsequently had increasing shortness of breath for 2 days. Denied any chest pain, cough congestion fevers or chills. His breathing had worsened and he was sent to the ER by EMS. He reported compliance with medications and salt/fluid restriction, denied any PND he reported mild orthopnea, no lower extremity edema. Upon evaluation in the ER he was noted to have acute respiratory failure/pulmonary edema requiring BiPAP, hyperkalemia and elevated troponin.  Assessment/Plan:    Acute respiratory failure with hypoxia/Volume overload -Nephro notes last HD 8/22 and pt left at his dry weight -pt has improved since admit and has weaned from BiPAP to [place] oxygen -cont to wean O2 as tolerated    Elevation of cardiac enzymes/CAD -last cath 2013 which showed 50% eccentric ostial left main stenosis, 50-70% stenosis in prox LAD, 20% ramus and 50% proximal RCA and medical management was recommended. -Cards following and note elevated enzymes likely due to acute volume overload -cont Plavix and Coreg -ECHO pending    CKD (chronic kidney disease) stage V requiring chronic dialysis -as above -HD for today -anticipate K+ will return to normal after HD    Diabetes mellitus -CBG controlled -continue Novolin 70/30 insulin once diet resumed   HTN -cont BB and ARB    Mixed hyperlipidemia -cont Zocor    Suspected OHS/SA -needs outpt sleep study as he almost assuredly has OHS/SA   DVT prophylaxis: heparin IV Code Status: Full Family Communication: Patient, wife and daughter at bedside Disposition Plan/Expected LOS: Transfer to 6700  telemetry  Consultants: Nephrology Cardiology  Procedures: 2-D echocardiogram - pending  Antibiotics: Levaquin x1 dose in the emergency department  HPI/Subjective: Patient alert and endorses much improved respiratory symptoms. Denies chest pain.  Objective: Blood pressure 117/56, pulse 109, temperature 98.2 F (36.8 C), temperature source Oral, resp. rate 14, height 5\' 9"  (1.753 m), weight 120.6 kg (265 lb 14 oz), SpO2 98.00%. No intake or output data in the 24 hours ending 06/08/13 1200   Exam: General: No acute respiratory distress Lungs: Clear to auscultation bilaterally without wheezes or crackles, 4 L Cardiovascular: Regular rate, mildly tachycardic and rhythm without murmur gallop or rub normal S1 and S2, no peripheral edema or JVD Abdomen: Nontender, nondistended, soft, bowel sounds positive, no rebound, no ascites, no appreciable mass Musculoskeletal: No significant cyanosis, clubbing of bilateral lower extremities Neurological: Alert and oriented x 3, moves all extremities x 4 without focal neurological deficits, CN 2-12 intact  Scheduled Meds:  Scheduled Meds: . allopurinol  100 mg Oral Daily  . amitriptyline  25 mg Oral QHS  . calcium acetate  667 mg Oral TID WC  . carvedilol  6.25 mg Oral BID WC  . cinacalcet  60 mg Oral Q breakfast  . clopidogrel  75 mg Oral QAC breakfast  . dextrose  25 mL Intravenous Once  . doxercalciferol      . doxercalciferol  2 mcg Intravenous Q M,W,F-HD  . insulin aspart  10 Units Intravenous Once  . insulin aspart  15 Units Subcutaneous TID WC  . insulin NPH  20 Units Subcutaneous BID AC & HS  . multivitamin  1 tablet Oral Daily  . nitroGLYCERIN  1 inch Topical Once  . sevelamer carbonate  2,400 mg Oral TID WC  . simvastatin  20 mg Oral QHS   Continuous Infusions: . heparin 1,250 Units/hr (06/08/13 0444)    Data Reviewed: Basic Metabolic Panel:  Recent Labs Lab 06/08/13 0241 06/08/13 0438  NA 136 135  K 5.4* 6.5*  CL  94* 102  CO2 22  --   GLUCOSE 143* 187*  BUN 51* 52*  CREATININE 10.63* 10.90*  CALCIUM 9.7  --    Liver Function Tests: No results found for this basename: AST, ALT, ALKPHOS, BILITOT, PROT, ALBUMIN,  in the last 168 hours  CBC:  Recent Labs Lab 06/08/13 0240 06/08/13 0438  WBC 13.0*  --   HGB 11.5* 12.6*  HCT 34.1* 37.0*  MCV 91.2  --   PLT 259  --    Cardiac Enzymes:  Recent Labs Lab 06/08/13 0241 06/08/13 0437 06/08/13 0826  CKTOTAL  --  209  --   CKMB  --  6.9*  --   TROPONINI 0.49* 0.56* 0.55*   BNP (last 3 results)  Recent Labs  06/08/13 0240  PROBNP 12766.0*   CBG:  Recent Labs Lab 06/08/13 0204  GLUCAP 86    Recent Results (from the past 240 hour(s))  MRSA PCR SCREENING     Status: None   Collection Time    06/08/13  8:16 AM      Result Value Range Status   MRSA by PCR NEGATIVE  NEGATIVE Final   Comment:            The GeneXpert MRSA Assay (FDA     approved for NASAL specimens     only), is one component of a     comprehensive MRSA colonization     surveillance program. It is not     intended to diagnose MRSA     infection nor to guide or     monitor treatment for     MRSA infections.    Studies:  Recent x-ray studies have been reviewed in detail by the Attending Physician  Erin Hearing, ANP [facility] Office  [phone] Pager [phone]  **If unable to reach the above provider after paging please contact the [place] @ [phone]  On-Call/Text Page:      [email or web address]      password [email or web address]  If 7PM-7AM, please contact night-coverage [email or web address] Password [email or web address] 06/08/2013, 12:00 PM   LOS: 0 days   I have personally examined this patient and reviewed the entire database. I have reviewed the above note, made any necessary editorial changes, and agree with its content.  [name], MD [facility]

## 2013-06-08 NOTE — ED Provider Notes (Signed)
CSN: [id number]     Arrival date & time 06/08/13  0200 History     First MD Initiated Contact with Patient 06/08/13 [phone]     Chief Complaint  Patient presents with  . Shortness of Breath  . Hypoglycemia   (Consider location/radiation/quality/duration/timing/severity/associated sxs/prior Treatment) HPI Comments: 67 yo male with esrd, cad, abnormal ekg hx presents with dyspnea gradually worsening since Friday after dialysis.  No hx of heart issues except abnormal stress per pt.  Pt not sure cardiologist name.  Pt was supposed to have cath a few years back however did not due to renal fx.  Worse with lying flat, mild cough.  No CHF hx known.  No cp.  Pt improved with cpap on route.  Glucose 34 in field, improved with po and glucagon.  Nitro given.  The history is provided by the patient.    Past Medical History  Diagnosis Date  . Abnormal electrocardiogram   . Hyperlipidemia   . Retinopathy   . Gout   . ESRD (end stage renal disease)     on hemodialysis Mon, Wed, Fri - followed by [name]  . Type II or unspecified type diabetes mellitus without mention of complication, not stated as uncontrolled     adult onset  . Erectile dysfunction     penile implant  . Abnormal stress test     s/p cath November 2013 with modest disease involving the ostial left main, proximal LAD, proximal RCA - do not appear to be hemodynamically signficant; mild LV dysfunction  . Obesity   . Shortness of breath     with exertion  . Hypertension   . Hepatitis C   . COPD (chronic obstructive pulmonary disease)   . Coronary artery disease     per cath report 2013   Past Surgical History  Procedure Laterality Date  . Penile prosthesis implant    . Fistula      RUE and wrist  . Cardiac catheterization  August 26, 2012  . Eye surgery      laser.  and surgery for DM  . Insertion of dialysis catheter Right 01/01/2013    Procedure: INSERTION OF DIALYSIS CATHETER right  internal jugular;  Surgeon:  Serafina Mitchell, MD;  Location: [facility];  Service: Vascular;  Laterality: Right;  . Av fistula placement Left 01/13/2013    Procedure: INSERTION OF ARTERIOVENOUS (AV) GORE-TEX GRAFT ARM;  Surgeon: Angelia Mould, MD;  Location: [facility];  Service: Vascular;  Laterality: Left;  . Thrombectomy w/ embolectomy Left 03/26/2013    Procedure: Attempted thrombectomy of left arm arteriovenous goretex graft.;  Surgeon: Angelia Mould, MD;  Location: [facility];  Service: Vascular;  Laterality: Left;  . Insertion of dialysis catheter N/A 03/26/2013    Procedure: INSERTION OF DIALYSIS CATHETER Left internal jugular vein;  Surgeon: Angelia Mould, MD;  Location: [facility];  Service: Vascular;  Laterality: N/A;  . Avgg removal Left 03/26/2013    Procedure: REMOVAL OF  NON INCORPORATED ARTERIOVENOUS GORETEX GRAFT ([place]) left arm * repair of  left brachial artery with vein patch angioplasty.;  Surgeon: Angelia Mould, MD;  Location: [facility];  Service: Vascular;  Laterality: Left;  . Av fistula placement Left 05/05/2013    Procedure: INSERTION OF LEFT UPPER ARM  ARTERIOVENOUS GORTEX GRAFT;  Surgeon: Angelia Mould, MD;  Location: [facility] OR;  Service: Vascular;  Laterality: Left;   Family History  Problem Relation Age of Onset  . Heart disease Father  History  Substance Use Topics  . Smoking status: Former Smoker -- 7 years    Quit date: 06/10/1973  . Smokeless tobacco: Never Used  . Alcohol Use: No    Review of Systems  Constitutional: Positive for fatigue. Negative for fever and chills.  HENT: Negative for neck pain and neck stiffness.   Eyes: Negative for visual disturbance.  Respiratory: Positive for cough and shortness of breath.   Cardiovascular: Negative for chest pain.  Gastrointestinal: Negative for vomiting and abdominal pain.  Genitourinary: Negative for dysuria and flank pain.  Musculoskeletal: Negative for back pain.  Skin: Negative for rash.  Neurological: Negative for  light-headedness and headaches.    Allergies  Review of patient's allergies indicates no known allergies.  Home Medications   Current Outpatient Rx  Name  Route  Sig  Dispense  Refill  . albuterol (PROVENTIL HFA;VENTOLIN HFA) 108 (90 BASE) MCG/ACT inhaler   Inhalation   Inhale 2 puffs into the lungs every 6 (six) hours as needed for wheezing.         [name] allopurinol (ZYLOPRIM) 100 MG tablet   Oral   Take 100 mg by mouth daily.          [name] amitriptyline (ELAVIL) 25 MG tablet   Oral   Take 25 mg by mouth at bedtime.         . Calcium Acetate 667 MG TABS   Oral   Take 1 tablet by mouth 3 (three) times daily with meals.          . cinacalcet (SENSIPAR) 60 MG tablet   Oral   Take 60 mg by mouth daily.         . clopidogrel (PLAVIX) 75 MG tablet   Oral   Take 75 mg by mouth daily.          . colchicine 0.6 MG tablet   Oral   Take 0.6 mg by mouth daily as needed (for gout flare-ups).          . fluticasone (FLONASE) 50 MCG/ACT nasal spray   Nasal   Place 2 sprays into the nose at bedtime.         . insulin NPH (HUMULIN N,NOVOLIN N) 100 UNIT/ML injection   Subcutaneous   Inject 20 Units into the skin 2 (two) times daily.          . insulin regular (NOVOLIN R,HUMULIN R) 100 units/mL injection   Subcutaneous   Inject 40 Units into the skin 3 (three) times daily before meals.          [name] losartan (COZAAR) 50 MG tablet   Oral   Take 50 mg by mouth daily.         . metoprolol (LOPRESSOR) 50 MG tablet   Oral   Take 50 mg by mouth every evening.          . multivitamin (RENA-VIT) TABS tablet   Oral   Take 1 tablet by mouth daily.           . sevelamer (RENVELA) 800 MG tablet   Oral   Take 2,400 mg by mouth 3 (three) times daily with meals.          . simvastatin (ZOCOR) 20 MG tablet   Oral   Take 20 mg by mouth at bedtime.            BP 131/54  Pulse 110  Resp 15  SpO2 100% Physical Exam  Nursing note and vitals  reviewed. Constitutional: He is oriented to person, place, and time. He appears well-developed and well-nourished. He appears distressed.  HENT:  Head: Normocephalic and atraumatic.  Eyes: Conjunctivae are normal. Right eye exhibits no discharge. Left eye exhibits no discharge.  Neck: Normal range of motion. Neck supple. No tracheal deviation present.  Cardiovascular: Regular rhythm.  Tachycardia present.   Pulmonary/Chest: Effort normal. He has rales (at bases).  Abdominal: Soft. He exhibits distension. There is no tenderness. There is no guarding.  Musculoskeletal: He exhibits edema (mild LE bilateral).  Neurological: He is alert and oriented to person, place, and time. No cranial nerve deficit.  Skin: Skin is warm. No rash noted.  Psychiatric: He has a normal mood and affect.    ED Course   Procedures (including critical care time)   EMERGENCY DEPARTMENT [place] CARDIAC EXAM "Study: Limited Ultrasound of the heart and pericardium"  INDICATIONS:Dyspnea Multiple views of the heart and pericardium were obtained in real-time with a multi-frequency probe.  PERFORMED [id number]  IMAGES ARCHIVED?: Yes  FINDINGS: No pericardial effusion, Hyperdynamic contractility and Tamponade physiology absent  LIMITATIONS:  Body habitus  VIEWS USED: Parasternal long axis, Parasternal short axis and Apical 4 chamber  B lines on linear probe scan  INTERPRETATION: Cardiac activity present, Pericardial effusioin absent, Probable elevated CVP and Increased contractility  CRITICAL CARE Performed by: Mariea Clonts   Total critical care time: 35 min  Critical care time was exclusive of separately billable procedures and treating other patients.  Critical care was necessary to treat or prevent imminent or life-threatening deterioration.  Critical care was time spent personally by me on the following activities: development of treatment plan with patient and/or surrogate as well as nursing, discussions  with consultants, evaluation of patient's response to treatment, examination of patient, obtaining history from patient or surrogate, ordering and performing treatments and interventions, ordering and review of laboratory studies, ordering and review of radiographic studies, pulse oximetry and re-evaluation of patient's condition.   Labs Reviewed  CBC - Abnormal; Notable for the following:    WBC 13.0 (*)    RBC 3.74 (*)    Hemoglobin 11.5 (*)    HCT 34.1 (*)    All other components within normal limits  POCT I-STAT TROPONIN I - Abnormal; Notable for the following:    Troponin i, poc 0.58 (*)    All other components within normal limits  CULTURE, BLOOD (ROUTINE X 2)  CULTURE, BLOOD (ROUTINE X 2)  GLUCOSE, CAPILLARY  PRO B NATRIURETIC PEPTIDE  TROPONIN I   Dg Chest Portable 1 View  06/08/2013   *RADIOLOGY REPORT*  Clinical Data: Shortness of breath  PORTABLE CHEST - 1 VIEW  Comparison: 03/26/2013  Findings: Heart size upper normal to mildly enlarged with central vascular congestion.  Interstitial prominence and perihilar increased markings.  Left lung consolidation.  Layering pleural effusions not excluded.  Multilevel degenerative change.  No pneumothorax.  Left sided large bore catheter with tips that project over the cavoatrial junction.  No interval osseous change.  IMPRESSION: Central vascular congestion and pulmonary edema pattern.  Left lung base opacity; atelectasis versus infiltrate.   Original Report Authenticated By: Carlos Levering, M.D.   1. Hypoglycemia   2. Dyspnea   3. Elevated troponin   4. Pulmonary edema   5. ESRD (end stage renal disease)     MDM  Patient improved significantly on bipap. Trial off pt comfortable on 5 L [place].  Bedside US did not show significant pericardial effusion, B lines  in lungs.   Troponin elevated, with known CAD and no recent elevation concern for NSTEMI/ renal related. ASA and heparin.  Paged cardio, spoke with on call, recommended calling  pt cardiologist. Clinically pulm edema, cxr reviewed, possible infiltrate - cultures and levaquin given. Dialysis likely this am.   Date: 06/08/2013  Rate: 129  Rhythm: sinus tachycardia  QRS Axis: normal  Intervals: QT prolonged  ST/T Wave abnormalities: nonspecific ST changes and nonspecific T wave changes  Conduction Disutrbances:none  Narrative Interpretation:   Old EKG Reviewed: changes noted  Pt has had T wave inversions lateral in the past.   Admitted.  Cardiology will see and decide whether to continue heparin.      Spoke with nephrology, cardiology and hospitalist, all agree with step down. Mild elevated K, calcium and neb given, nephro comfortable with no other treatments at this time. Pt rechecked multiple times, comfortable on bipap. Updated hospitalist and nephro.  Admission to step down.  Mariea Clonts, MD 06/08/13 [phone]

## 2013-06-08 NOTE — Progress Notes (Signed)
Echocardiogram 2D Echocardiogram has been performed.  Joelene Millin 06/08/2013, 4:17 PM

## 2013-06-08 NOTE — ED Notes (Signed)
Critical potassium results shown to Dr. Reather Converse.

## 2013-06-08 NOTE — ED Notes (Signed)
0210  Pt arrives from home with SOB and Hypoglycemia.  EMS was called out for Hypoglycemia and when EMS arrived to the house pt was tripoding on the bed with BS of 34.  Pt was placed on C-PAP and was given oral glucose and glucagon and 2 nitro and 5mg  neb treatment.    2015  Pt is now on [place] states he feels a lot better than before

## 2013-06-08 NOTE — Progress Notes (Signed)
Patient ID: Ernest [name], male   DOB: 02-23-1946, 67 y.o.   MRN: [id number]    Subjective:  Denies SSCP, palpitations or Dyspnea On bipap   Objective:  Filed Vitals:   06/08/13 0530 06/08/13 0556 06/08/13 0600 06/08/13 0630  BP: 115/46   152/54  Pulse: 101 106 102 107  Temp:    98 F (36.7 C)  TempSrc:    Oral  Resp: 18 23 19 16   Height:   5\' 9"  (1.753 m)   Weight:   260 lb 2.3 oz (118 kg)   SpO2: 100% 100% 100% 100%    Intake/Output from previous day: No intake or output data in the 24 hours ending 06/08/13 0801  Physical Exam: Affect appropriate Obese black male HEENT: normal Neck supple with no adenopathy JVP normal no bruits no thyromegaly Lungs rales at base  Heart:  S1/S2 no murmur, no rub, gallop or click PMI normal Abdomen: benighn, BS positve, no tenderness, no AAA no bruit.  No HSM or HJR Distal pulses intact with no bruits Plus 2 edema  edema Neuro non-focal Skin warm and dry No muscular weakness   Lab Results: Basic Metabolic Panel:  Recent Labs  06/08/13 0241 06/08/13 0438  NA 136 135  K 5.4* 6.5*  CL 94* 102  CO2 22  --   GLUCOSE 143* 187*  BUN 51* 52*  CREATININE 10.63* 10.90*  CALCIUM 9.7  --    CBC:  Recent Labs  06/08/13 0240 06/08/13 0438  WBC 13.0*  --   HGB 11.5* 12.6*  HCT 34.1* 37.0*  MCV 91.2  --   PLT 259  --    Cardiac Enzymes:  Recent Labs  06/08/13 0241 06/08/13 0437  CKTOTAL  --  209  CKMB  --  6.9*  TROPONINI 0.49* 0.56*    Imaging: Dg Chest Portable 1 View  06/08/2013   *RADIOLOGY REPORT*  Clinical Data: Shortness of breath  PORTABLE CHEST - 1 VIEW  Comparison: 03/26/2013  Findings: Heart size upper normal to mildly enlarged with central vascular congestion.  Interstitial prominence and perihilar increased markings.  Left lung consolidation.  Layering pleural effusions not excluded.  Multilevel degenerative change.  No pneumothorax.  Left sided large bore catheter with tips that project over the  cavoatrial junction.  No interval osseous change.  IMPRESSION: Central vascular congestion and pulmonary edema pattern.  Left lung base opacity; atelectasis versus infiltrate.   Original Report Authenticated By: Carlos Levering, M.D.    Cardiac Studies:  ECG:  SR chronic lateral T wave changes   Telemetry: SR Sinus tachycardia rate 90-115  Echo: EF 60% 9/12 repeat pending   Medications:   . allopurinol  100 mg Oral Daily  . amitriptyline  25 mg Oral QHS  . cinacalcet  60 mg Oral Q breakfast  . clopidogrel  75 mg Oral QAC breakfast  . dextrose  25 mL Intravenous Once  . insulin aspart  10 Units Intravenous Once  . insulin aspart  15 Units Subcutaneous TID WC  . insulin NPH  20 Units Subcutaneous BID AC & HS  . metoprolol  50 mg Oral QHS  . multivitamin  1 tablet Oral Daily  . nitroGLYCERIN  1 inch Topical Once  . sevelamer carbonate  2,400 mg Oral TID WC  . simvastatin  20 mg Oral QHS  . sodium polystyrene  15 g Oral Once     . heparin 1,250 Units/hr (06/08/13 0444)    Assessment/Plan:  CHF:  Volume  overload Needs to be corrected with dialysis.  Component of sleep apnea and obesity hypoventilation Would check ABG.  No indication for cath at this time.   Tachycardia:  Echo pending change to coreg and increase to bid dosing  Chol continue statin  [name] 06/08/2013, 8:01 AM

## 2013-06-08 NOTE — Consult Note (Addendum)
Admit date: 06/08/2013 Referring Physician  Dr. Reather Converse Primary Cardiologist  Dr. Martinique Reason for Consultation  Acute pulmonary edema  HPI: 67 yo male with esrd, cad, abnormal ekg hx presents with dyspnea gradually worsening since Friday after dialysis. He has a history of CAD with last cath 2013 which showed 50% eccentric ostial left main stenosis, 50-70% stenosis in prox LAD, 20% ramus and 50% proximal RCA and medical management was recommended.  Since Friday he has been having worsening SOB.  Friday night he had to prop himself up on pillows to sleep. He continued to have SOB on Saturday and Saturday night had to sleep sitting propped up.  He felt poorly yesterday and did not go to church.   Around [place] last night his breathing became more labored and he became diaphoretic and his wife called EMS.  In ER he was in acute pulmonary edema and was placed on BiPAP with improvement in O2 sats and breathing.  He currently appears comfortable on BiPAP.  His initial troponin was mildly elevated and Cardiology is now asked to consult.  He denies any chest pain.      PMH:   Past Medical History  Diagnosis Date  . Abnormal electrocardiogram   . Hyperlipidemia   . Retinopathy   . Gout   . ESRD (end stage renal disease)     on hemodialysis Mon, Wed, Fri - followed by [name]  . Type II or unspecified type diabetes mellitus without mention of complication, not stated as uncontrolled     adult onset  . Erectile dysfunction     penile implant  . Abnormal stress test     s/p cath November 2013 with modest disease involving the ostial left main, proximal LAD, proximal RCA - do not appear to be hemodynamically signficant; mild LV dysfunction  . Obesity   . Shortness of breath     with exertion  . Hypertension   . Hepatitis C   . COPD (chronic obstructive pulmonary disease)   . Coronary artery disease     per cath report 2013     PSH:   Past Surgical History  Procedure Laterality Date  .  Penile prosthesis implant    . Fistula      RUE and wrist  . Cardiac catheterization  August 26, 2012  . Eye surgery      laser.  and surgery for DM  . Insertion of dialysis catheter Right 01/01/2013    Procedure: INSERTION OF DIALYSIS CATHETER right  internal jugular;  Surgeon: Serafina Mitchell, MD;  Location: [place];  Service: Vascular;  Laterality: Right;  . Av fistula placement Left 01/13/2013    Procedure: INSERTION OF ARTERIOVENOUS (AV) GORE-TEX GRAFT ARM;  Surgeon: Angelia Mould, MD;  Location: [place];  Service: Vascular;  Laterality: Left;  . Thrombectomy w/ embolectomy Left 03/26/2013    Procedure: Attempted thrombectomy of left arm arteriovenous goretex graft.;  Surgeon: Angelia Mould, MD;  Location: [place];  Service: Vascular;  Laterality: Left;  . Insertion of dialysis catheter N/A 03/26/2013    Procedure: INSERTION OF DIALYSIS CATHETER Left internal jugular vein;  Surgeon: Angelia Mould, MD;  Location: [place];  Service: Vascular;  Laterality: N/A;  . Avgg removal Left 03/26/2013    Procedure: REMOVAL OF  NON INCORPORATED ARTERIOVENOUS GORETEX GRAFT ([place]) left arm * repair of  left brachial artery with vein patch angioplasty.;  Surgeon: Angelia Mould, MD;  Location: [place];  Service: Vascular;  Laterality: Left;  . Av fistula placement Left 05/05/2013    Procedure: INSERTION OF LEFT UPPER ARM  ARTERIOVENOUS GORTEX GRAFT;  Surgeon: Angelia Mould, MD;  Location: [place];  Service: Vascular;  Laterality: Left;    Allergies:  Review of patient's allergies indicates no known allergies. Prior to Admit Meds:   (Not in a hospital admission) Fam HX:    Family History  Problem Relation Age of Onset  . Heart disease Father    Social HX:    History   Social History  . Marital Status: Married    Spouse Name: N/A    Number of Children: N/A  . Years of Education: N/A   Occupational History  . 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  Smoking status: Former Smoker -- 7 years    Quit date: 06/10/1973  . Smokeless tobacco: Never Used  . Alcohol Use: No  . Drug Use: No  . Sexual Activity: No   Other Topics Concern  . Not on file   Social History Narrative   Adopted mentally retarded child at home, 2 adopted children, 3 living and 2 deceased.     ROS:  All 11 ROS were addressed and are negative except what is stated in the HPI  Physical Exam: Blood pressure 123/53, pulse 103, resp. rate 15, height 5' 8.9" (1.75 m), weight 117.3 kg (258 lb 9.6 oz), SpO2 100.00%.    General: Well developed, well nourished, in no acute distress Head: Eyes PERRLA, No xanthomas.   Normal cephalic and atramatic  Lungs:   Clear  anteriorly Heart:   HRRR S1 S2 Pulses are 2+ & equal.            No carotid bruit. No JVD.  No abdominal bruits. No femoral bruits. Abdomen: Bowel sounds are positive, abdomen soft and non-tender without masses Extremities:   No clubbing, cyanosis or edema.  DP +1 Neuro: Alert and oriented X 3. Psych:  Good affect, responds appropriately    Labs:   Lab Results  Component Value Date   WBC 13.0* 06/08/2013   HGB 12.6* 06/08/2013   HCT 37.0* 06/08/2013   MCV 91.2 06/08/2013   PLT 259 06/08/2013    Recent Labs Lab 06/08/13 0241 06/08/13 0438  NA 136 135  K 5.4* 6.5*  CL 94* 102  CO2 22  --   BUN 51* 52*  CREATININE 10.63* 10.90*  CALCIUM 9.7  --   GLUCOSE 143* 187*   No results found for this basename: PTT   Lab Results  Component Value Date   INR 1.0 08/21/2012   INR 1.02 10/14/2009   INR 1.12 09/26/2009   Lab Results  Component Value Date   TROPONINI 0.56* 06/08/2013         Radiology:  Dg Chest Portable 1 View  06/08/2013   *RADIOLOGY REPORT*  Clinical Data: Shortness of breath  PORTABLE CHEST - 1 VIEW  Comparison: 03/26/2013  Findings: Heart size upper normal to mildly enlarged with central vascular congestion.  Interstitial prominence and perihilar increased markings.  Left lung  consolidation.  Layering pleural effusions not excluded.  Multilevel degenerative change.  No pneumothorax.  Left sided large bore catheter with tips that project over the cavoatrial junction.  No interval osseous change.  IMPRESSION: Central vascular congestion and pulmonary edema pattern.  Left lung base opacity; atelectasis versus infiltrate.   Original Report Authenticated By: Carlos Levering, M.D.    EKG:  Sinus tachycardia with diffuse ST abnormality inferolaterally improved from EKG 03/2013  ASSESSMENT:  1.  Acute diastolic CHF secondary to volume overload from ESRD - proBNP 12766 with pulmonary edema on Cxray 2.  Moderate ASCAD by cath 2013 - he has not reported any chest pain 3.  Elevated troponin most likely secondary to acute CHF in setting of ESRD 4.  ESRD on HD 5.  Hyperkalemia  PLAN:   1.  Patient will be dialyzed this am by renal to remove volume 2.  Check CPK/MB 3.  Most likely etiology of elevated troponin is volume overload with ESRD - he has not had any chest pain.  Will defer any further workup for ischemia to Dr. Martinique in am.  Sueanne Margarita, MD  06/08/2013  5:23 AM

## 2013-06-08 NOTE — H&P (Addendum)
[facility] History and Physical  Ernest [name] [id number] DOB: August 27, 1946 DOA: 06/08/2013  Referring physician: EDP  PCP: Louis Meckel, MD  Specialists: Renal Dr. Moshe Cipro, endocrine Dr.Balan  Chief Complaint: Shortness of breath  HPI: Ernest [name] is a 67 y.o. male with history of ESRD on hemodialysis MWF, moderate CAD, diabetes on insulin, hypertension presents to the ER today with the above complaint. He completed his dialysis on Friday, subsequently has had increasing shortness of breath for 2 days. Denies any chest pain, cough congestion fevers or chills. His breathing worsened and he came to the ER by EMS this morning. He reports compliance with medications and salt/fluid restriction, denies any PND he reports mild orthopnea, no lower extremity edema. Upon evaluation the ER noted to have acute respiratory failure/pulmonary edema requiring BiPAP, hyperkalemia and elevated troponin.   Review of Systems: The patient denies anorexia, fever, weight loss,, vision loss, decreased hearing, hoarseness, chest pain, syncope, peripheral edema, balance deficits, hemoptysis, abdominal pain, melena, hematochezia, severe indigestion/heartburn, hematuria, incontinence, genital sores, muscle weakness, suspicious skin lesions, transient blindness, difficulty walking, depression, unusual weight change, abnormal bleeding, enlarged lymph nodes, angioedema, and breast masses.    Past Medical History  Diagnosis Date  . Abnormal electrocardiogram   . Hyperlipidemia   . Retinopathy   . Gout   . ESRD (end stage renal disease)     on hemodialysis Mon, Wed, Fri - followed by [name]  . Type II or unspecified type diabetes mellitus without mention of complication, not stated as uncontrolled     adult onset  . Erectile dysfunction     penile implant  . Abnormal stress test     s/p cath November 2013 with modest disease involving the ostial left main, proximal LAD, proximal  RCA - do not appear to be hemodynamically signficant; mild LV dysfunction  . Obesity   . Shortness of breath     with exertion  . Hypertension   . Hepatitis C   . COPD (chronic obstructive pulmonary disease)   . Coronary artery disease     per cath report 2013   Past Surgical History  Procedure Laterality Date  . Penile prosthesis implant    . Fistula      RUE and wrist  . Cardiac catheterization  August 26, 2012  . Eye surgery      laser.  and surgery for DM  . Insertion of dialysis catheter Right 01/01/2013    Procedure: INSERTION OF DIALYSIS CATHETER right  internal jugular;  Surgeon: Serafina Mitchell, MD;  Location: [place];  Service: Vascular;  Laterality: Right;  . Av fistula placement Left 01/13/2013    Procedure: INSERTION OF ARTERIOVENOUS (AV) GORE-TEX GRAFT ARM;  Surgeon: Angelia Mould, MD;  Location: [place];  Service: Vascular;  Laterality: Left;  . Thrombectomy w/ embolectomy Left 03/26/2013    Procedure: Attempted thrombectomy of left arm arteriovenous goretex graft.;  Surgeon: Angelia Mould, MD;  Location: [place];  Service: Vascular;  Laterality: Left;  . Insertion of dialysis catheter N/A 03/26/2013    Procedure: INSERTION OF DIALYSIS CATHETER Left internal jugular vein;  Surgeon: Angelia Mould, MD;  Location: [place];  Service: Vascular;  Laterality: N/A;  . Avgg removal Left 03/26/2013    Procedure: REMOVAL OF  NON INCORPORATED ARTERIOVENOUS GORETEX GRAFT ([place]) left arm * repair of  left brachial artery with vein patch angioplasty.;  Surgeon: Angelia Mould, MD;  Location: [place];  Service: Vascular;  Laterality: Left;  .  Av fistula placement Left 05/05/2013    Procedure: INSERTION OF LEFT UPPER ARM  ARTERIOVENOUS GORTEX GRAFT;  Surgeon: Angelia Mould, MD;  Location: [place];  Service: Vascular;  Laterality: Left;   Social History:  reports that he quit smoking about 40 years ago. He has never used smokeless tobacco. He reports that he does not  drink alcohol or use illicit drugs. Lives at home with spouse  [name]  Family History  Problem Relation Age of Onset  . Heart disease Father     Prior to Admission medications   Medication Sig Start Date End Date Taking? Authorizing Provider  albuterol (PROVENTIL HFA;VENTOLIN HFA) 108 (90 BASE) MCG/ACT inhaler Inhale 2 puffs into the lungs every 6 (six) hours as needed for wheezing.   Yes Historical Provider, MD  allopurinol (ZYLOPRIM) 100 MG tablet Take 100 mg by mouth daily.    Yes Historical Provider, MD  amitriptyline (ELAVIL) 25 MG tablet Take 25 mg by mouth at bedtime.   Yes Historical Provider, MD  Calcium Acetate 667 MG TABS Take 1 tablet by mouth 3 (three) times daily with meals.    Yes Historical Provider, MD  cinacalcet (SENSIPAR) 60 MG tablet Take 60 mg by mouth daily.   Yes Historical Provider, MD  clopidogrel (PLAVIX) 75 MG tablet Take 75 mg by mouth daily.    Yes Historical Provider, MD  colchicine 0.6 MG tablet Take 0.6 mg by mouth daily as needed (for gout flare-ups).    Yes Historical Provider, MD  fluticasone (FLONASE) 50 MCG/ACT nasal spray Place 2 sprays into the nose at bedtime.   Yes Historical Provider, MD  insulin NPH (HUMULIN N,NOVOLIN N) 100 UNIT/ML injection Inject 20 Units into the skin 2 (two) times daily.    Yes Historical Provider, MD  insulin regular (NOVOLIN R,HUMULIN R) 100 units/mL injection Inject 40 Units into the skin 3 (three) times daily before meals.    Yes Historical Provider, MD  losartan (COZAAR) 50 MG tablet Take 50 mg by mouth daily.   Yes Historical Provider, MD  metoprolol (LOPRESSOR) 50 MG tablet Take 50 mg by mouth every evening.    Yes Historical Provider, MD  multivitamin (RENA-VIT) TABS tablet Take 1 tablet by mouth daily.     Yes Historical Provider, MD  sevelamer (RENVELA) 800 MG tablet Take 2,400 mg by mouth 3 (three) times daily with meals.    Yes Historical Provider, MD  simvastatin (ZOCOR) 20 MG tablet Take 20 mg by  mouth at bedtime.     Yes Historical Provider, MD   Physical Exam: Filed Vitals:   06/08/13 0515  BP: 124/51  Pulse: 102  Resp: 17     General: AAOx3, in mild distress on BIPAP  HEENT: PERRLA, EOMI  CVS S1-S2 regular rate rhythm no murmurs rubs or gallops  Lungs decreased breath sounds to bases  Abdomen soft obese nontender with normal bowel sounds no organomegaly  Extremities no edema clubbing or cyanosis  Neuro moves all extremities no localizing signs extent   skin no rashes or skin breakdown multiple keloids noted  Psychiatric appropriate mood and affect    Labs on Admission:  Basic Metabolic Panel:  Recent Labs Lab 06/08/13 0241 06/08/13 0438  NA 136 135  K 5.4* 6.5*  CL 94* 102  CO2 22  --   GLUCOSE 143* 187*  BUN 51* 52*  CREATININE 10.63* 10.90*  CALCIUM 9.7  --    Liver Function Tests: No results found for this basename:  AST, ALT, ALKPHOS, BILITOT, PROT, ALBUMIN,  in the last 168 hours No results found for this basename: LIPASE, AMYLASE,  in the last 168 hours No results found for this basename: AMMONIA,  in the last 168 hours CBC:  Recent Labs Lab 06/08/13 0240 06/08/13 0438  WBC 13.0*  --   HGB 11.5* 12.6*  HCT 34.1* 37.0*  MCV 91.2  --   PLT 259  --    Cardiac Enzymes:  Recent Labs Lab 06/08/13 0241 06/08/13 0437  TROPONINI 0.49* 0.56*    BNP (last 3 results)  Recent Labs  06/08/13 0240  PROBNP 12766.0*   CBG:  Recent Labs Lab 06/08/13 0204  GLUCAP 86    Radiological Exams on Admission: Dg Chest Portable 1 View  06/08/2013   *RADIOLOGY REPORT*  Clinical Data: Shortness of breath  PORTABLE CHEST - 1 VIEW  Comparison: 03/26/2013  Findings: Heart size upper normal to mildly enlarged with central vascular congestion.  Interstitial prominence and perihilar increased markings.  Left lung consolidation.  Layering pleural effusions not excluded.  Multilevel degenerative change.  No pneumothorax.  Left sided large bore  catheter with tips that project over the cavoatrial junction.  No interval osseous change.  IMPRESSION: Central vascular congestion and pulmonary edema pattern.  Left lung base opacity; atelectasis versus infiltrate.   Original Report Authenticated By: Carlos Levering, M.D.    EKG: Independently reviewed. Non specific T wave changes  Assessment/Plan Active Problems:   CAD   ESRD   Acute respiratory failure   Non-ST elevation MI (NSTEMI)   Diabetes mellitus   1. Acute resp failure -Pulmonary edema, likely due to ESRD and NSTEMI -cycle cardiac enzymes, check ECHO -IV heparin per Pharmacy -Cards consulted -Renal Consulted for HD this am, i d/w dr.Deterding -add ASA, continue Plavix/metoprolol/statin  2. Hyperkalemia -Kayexalate, IV insulin/dextrose -HD this am -repeat labs after HD  3. CAD with moderate disease per cath 2013 -add ASA/continue plavix, metoprolol/statin -cards to see, ECHo  4. DM -continue Insulin at lower dose, cut down meal coverage Novolog,  -SSI  5. ESRd -on HD as noted above, renal notified  DVT proph: on IV heparin at this time.  Code Status: Full Code Family Communication: d/w wife at bedside Disposition Plan: inpatient/SDU  Time spent: 46min  Sandford Diop [facility] Pager [phone]  If 7PM-7AM, please contact night-coverage [email or web address] Password [email or web address] 06/08/2013, 5:35 AM

## 2013-06-08 NOTE — Progress Notes (Signed)
ANTICOAGULATION CONSULT NOTE - Initial Consult  Pharmacy Consult for heparin Indication: chest pain/ACS  [name]  Patient Measurements: Height: 5' 8.9" (175 cm) (on 05/05/13) Weight: 258 lb 9.6 oz (117.3 kg) (on 05/05/13) IBW/kg (Calculated) : 70.47 Heparin Dosing Weight:  89 kg  Vital Signs: BP: 134/69 mmHg (08/25 0319) Pulse Rate: 110 (08/25 0319)  Labs:  Recent Labs  06/08/13 0240 06/08/13 0241  HGB 11.5*  --   HCT 34.1*  --   PLT 259  --   TROPONINI  --  0.49*    Estimated Creatinine Clearance: 10.7 ml/min (by C-G formula based on Cr of 8.8).   Medical History: Past Medical History  Diagnosis Date  . Abnormal electrocardiogram   . Hyperlipidemia   . Retinopathy   . Gout   . ESRD (end stage renal disease)     on hemodialysis Mon, Wed, Fri - followed by [name]  . Type II or unspecified type diabetes mellitus without mention of complication, not stated as uncontrolled     adult onset  . Erectile dysfunction     penile implant  . Abnormal stress test     s/p cath November 2013 with modest disease involving the ostial left main, proximal LAD, proximal RCA - do not appear to be hemodynamically signficant; mild LV dysfunction  . Obesity   . Shortness of breath     with exertion  . Hypertension   . Hepatitis C   . COPD (chronic obstructive pulmonary disease)   . Coronary artery disease     per cath report 2013    Medications: see med hx. No blood thinners  Assessment: Ernest [name] is a 67 yo man to start heparin per pharmacy for ACS.  His PMH is significant for ESRD, HD MWF, abnormal EKG, DM2,   His first troponin is + at 0.58.  H/H 11.5/34.1, PLTC 259.    Goal of Therapy:  Heparin level 0.3-0.7 units/ml Monitor platelets by anticoagulation protocol: Yes   Plan:  Give 4000 units bolus x 1 Start heparin infusion at 1250 units/hr Check anti-Xa level in 6 hours and daily while on heparin Continue to monitor H&H and platelets Eudelia Bunch, Pharm.D. [id number] 06/08/2013 4:12 AM

## 2013-06-08 NOTE — ED Notes (Signed)
MD at bedside. 

## 2013-06-09 ENCOUNTER — Inpatient Hospital Stay (HOSPITAL_COMMUNITY): Payer: Medicare Other

## 2013-06-09 DIAGNOSIS — J9620 Acute and chronic respiratory failure, unspecified whether with hypoxia or hypercapnia: Secondary | ICD-10-CM | POA: Diagnosis not present

## 2013-06-09 DIAGNOSIS — R0602 Shortness of breath: Secondary | ICD-10-CM | POA: Diagnosis not present

## 2013-06-09 DIAGNOSIS — R7989 Other specified abnormal findings of blood chemistry: Secondary | ICD-10-CM | POA: Diagnosis not present

## 2013-06-09 DIAGNOSIS — N186 End stage renal disease: Secondary | ICD-10-CM | POA: Diagnosis not present

## 2013-06-09 DIAGNOSIS — I2510 Atherosclerotic heart disease of native coronary artery without angina pectoris: Secondary | ICD-10-CM | POA: Diagnosis not present

## 2013-06-09 DIAGNOSIS — E162 Hypoglycemia, unspecified: Secondary | ICD-10-CM

## 2013-06-09 DIAGNOSIS — I214 Non-ST elevation (NSTEMI) myocardial infarction: Secondary | ICD-10-CM | POA: Diagnosis not present

## 2013-06-09 DIAGNOSIS — I5043 Acute on chronic combined systolic (congestive) and diastolic (congestive) heart failure: Secondary | ICD-10-CM | POA: Diagnosis not present

## 2013-06-09 DIAGNOSIS — J90 Pleural effusion, not elsewhere classified: Secondary | ICD-10-CM | POA: Diagnosis not present

## 2013-06-09 DIAGNOSIS — J811 Chronic pulmonary edema: Secondary | ICD-10-CM | POA: Diagnosis not present

## 2013-06-09 DIAGNOSIS — J9600 Acute respiratory failure, unspecified whether with hypoxia or hypercapnia: Secondary | ICD-10-CM | POA: Diagnosis not present

## 2013-06-09 LAB — GLUCOSE, CAPILLARY
Glucose-Capillary: 110 mg/dL — ABNORMAL HIGH (ref 70–99)
Glucose-Capillary: 100 mg/dL — ABNORMAL HIGH (ref 70–99)
Glucose-Capillary: 118 mg/dL — ABNORMAL HIGH (ref 70–99)

## 2013-06-09 LAB — BASIC METABOLIC PANEL
BUN: 35 mg/dL — ABNORMAL HIGH (ref 6–23)
CO2: 27 mEq/L (ref 19–32)
CO2: 27 mEq/L (ref 19–32)
Chloride: 96 mEq/L (ref 96–112)
Chloride: 98 mEq/L (ref 96–112)
Creatinine, Ser: 7.77 mg/dL — ABNORMAL HIGH (ref 0.50–1.35)
Creatinine, Ser: 8.45 mg/dL — ABNORMAL HIGH (ref 0.50–1.35)
Glucose, Bld: 114 mg/dL — ABNORMAL HIGH (ref 70–99)
Glucose, Bld: 114 mg/dL — ABNORMAL HIGH (ref 70–99)

## 2013-06-09 LAB — CBC
MCV: 92.8 fL (ref 78.0–100.0)
Platelets: 245 10*3/uL (ref 150–400)

## 2013-06-09 MED ORDER — SODIUM POLYSTYRENE SULFONATE 15 GM/60ML PO SUSP
15.0000 g | Freq: Once | ORAL | Status: AC
  Administered 2013-06-09: 15 g via ORAL
  Filled 2013-06-09: qty 60

## 2013-06-09 MED ORDER — SODIUM POLYSTYRENE SULFONATE 15 GM/60ML PO SUSP: 30.0000 g | Freq: Once | ORAL | Status: DC

## 2013-06-09 NOTE — Progress Notes (Signed)
Patient Name: Ernest [name] Date of Encounter: 06/09/2013  Active Problems:   Mixed hyperlipidemia   CAD   CKD (chronic kidney disease) stage V requiring chronic dialysis   Acute respiratory failure with hypoxia   Elevation of cardiac enzymes   Diabetes mellitus   Volume overload    SUBJECTIVE: Never gets chest pain. Breathing better after HD yesterday. Had hypotension and hypoglycemia at the end of HD on Friday, SOB began later that day.  OBJECTIVE Filed Vitals:   06/09/13 0300 06/09/13 0400 06/09/13 0500 06/09/13 0600  BP:  122/28    Pulse: 106 100 101 102  Temp:  98.4 F (36.9 C)    TempSrc:  Oral    Resp: 19 16 15 16   Height:      Weight:      SpO2: 98% 97% 98% 98%    Intake/Output Summary (Last 24 hours) at 06/09/13 0645 Last data filed at 06/08/13 2100  Gross per 24 hour  Intake 1160.83 ml  Output   4001 ml  Net -2840.17 ml   Filed Weights   06/08/13 0600 06/08/13 0901 06/08/13 1320  Weight: 260 lb 2.3 oz (118 kg) 265 lb 14 oz (120.6 kg) 257 lb 0.9 oz (116.6 kg)    PHYSICAL EXAM General: Well developed, well nourished, male in no acute distress. Head: Normocephalic, atraumatic.  Neck: Supple without bruits, JVD about 8 cm, difficult to assess secondary to body habitus. Lungs:  Resp regular and unlabored, clear anteriorly. Heart: RRR, S1, S2, no S3, S4, or murmur; no rub. Abdomen: Soft, non-tender, non-distended, BS + x 4.  Extremities: No clubbing, cyanosis, no edema.  Neuro: Alert and oriented X 3. Moves all extremities spontaneously. Psych: Normal affect.  LABS: CBC: Recent Labs  06/08/13 0240 06/08/13 0438 06/09/13 0340  WBC 13.0*  --  9.8  HGB 11.5* 12.6* 11.3*  HCT 34.1* 37.0* 35.0*  MCV 91.2  --  92.8  PLT 259  --  99991111   Basic Metabolic Panel: Recent Labs  06/08/13 0241 06/08/13 0438 06/09/13 0340  NA 136 135 134*  K 5.4* 6.5* 6.5*  CL 94* 102 96  CO2 22  --  27  GLUCOSE 143* 187* 114*  BUN 51* 52* 35*  CREATININE 10.63*  10.90* 7.77*  CALCIUM 9.7  --  9.0   Cardiac Enzymes: Recent Labs  06/08/13 0241 06/08/13 0437 06/08/13 0826 06/08/13 1440 06/08/13 1825  CKTOTAL  --  209  --   --   --   CKMB  --  6.9*  --   --   --   TROPONINI 0.49* 0.56* 0.55* 0.88* 0.61*    Recent Labs  06/08/13 0244  TROPIPOC 0.58*   BNP: Pro B Natriuretic peptide (BNP)  Date/Time Value Range Status  06/08/2013  2:40 AM 12766.0* 0 - 125 pg/mL Final   TELE:   SR, ST     ECHO: 06/08/2013 - Left ventricle: The cavity size was normal. Ronnell Makarewicz thickness was increased in a pattern of mild LVH. Systolic function was moderately to severely reduced. The estimated ejection fraction was in the range of 30% to 35%. There is hypokinesis of the anteroseptal and inferoseptal myocardium. Doppler parameters are consistent with abnormal left ventricular relaxation (grade 1 diastolic dysfunction). - Mitral valve: Calcified annulus. Mildly thickened leaflets Impressions: - Reduced EF from prior (normal). Discussed with Dr. Thereasa Solo.  Radiology/Studies: Dg Chest Portable 1 View 06/08/2013   *RADIOLOGY REPORT*  Clinical Data: Shortness of breath  PORTABLE CHEST -  1 VIEW  Comparison: 03/26/2013  Findings: Heart size upper normal to mildly enlarged with central vascular congestion.  Interstitial prominence and perihilar increased markings.  Left lung consolidation.  Layering pleural effusions not excluded.  Multilevel degenerative change.  No pneumothorax.  Left sided large bore catheter with tips that project over the cavoatrial junction.  No interval osseous change.  IMPRESSION: Central vascular congestion and pulmonary edema pattern.  Left lung base opacity; atelectasis versus infiltrate.   Original Report Authenticated By: Carlos Levering, M.D.     Current Medications:  . allopurinol  100 mg Oral Daily  . amitriptyline  25 mg Oral QHS  . aspirin  325 mg Oral Daily  . calcium acetate  667 mg Oral TID WC  . carvedilol  6.25 mg Oral BID  WC  . cinacalcet  60 mg Oral Q breakfast  . clopidogrel  75 mg Oral QAC breakfast  . dextrose  25 mL Intravenous Once  . doxercalciferol  2 mcg Intravenous Q M,W,F-HD  . heparin subcutaneous  5,000 Units Subcutaneous Q8H  . insulin aspart  10 Units Intravenous Once  . insulin aspart  15 Units Subcutaneous TID WC  . insulin NPH  20 Units Subcutaneous BID AC & HS  . multivitamin  1 tablet Oral Daily  . nitroGLYCERIN  1 inch Topical Once  . sevelamer carbonate  2,400 mg Oral TID WC  . simvastatin  20 mg Oral QHS      ASSESSMENT AND PLAN:  CAD - Cath November 2013, after abnormal Myovue (done to screen for CAD in consideration for renal transplant) - Left mainstem: There is a 50% eccentric ostial left main stenosis.  Left anterior descending (LAD): The LAD is heavily calcified throughout the proximal vessel. There is 50-70% stenosis in the proximal vessel followed by segment of 40% disease in the proximal to mid vessel.  The ramus intermediate branch is moderate in size and has mild disease at the ostium up to 30%.  Left circumflex (LCx): The left circumflex has minor irregularities up to 20%.  Right coronary artery (RCA): The right coronary has a 50% stenosis in the proximal vessel.  Final Conclusions:  1. There is modest disease involving the ostial left main, proximal LAD and proximal right coronary. These lesions do not appear to be hemodynamically significant.  2. Mild left ventricular dysfunction with apical hypokinesis.  At that time, medical management was recommended. He has continued to be asymptomatic. However, now his EF is decreased, in the setting of acute pulmonary edema. His cardiac enzymes are slightly elevated (possibly from supply-demand mismatch). MD review data and advise if cath needed this admit.   Otherwise, per primary MD, Nephrology. Active Problems:   Mixed hyperlipidemia   CKD (chronic kidney disease) stage V requiring chronic dialysis   Acute respiratory  failure with hypoxia   Elevation of cardiac enzymes   Diabetes mellitus   Volume overload  Signed, Rosaria Ferries , [name] 6:45 AM 06/09/2013 Patient examined and agree. Continue medical therapy. Would not recath. Jenell Milliner, MD 06/09/2013 7:57 AM

## 2013-06-09 NOTE — Evaluation (Signed)
Physical Therapy Evaluation Patient Details Name: Ernest [name] MRN: [id number] DOB: Oct 15, 1946 Today's Date: 06/09/2013 Time: [id number] PT Time Calculation (min): 27 min  PT Assessment / Plan / Recommendation History of Present Illness  67 y.o. male with history of ESRD on hemodialysis MWF, moderate CAD, diabetes on insulin, hypertension presents to the ER today with the above complaint.  Clinical Impression  Pt with generalized deconditioning and currently dependent on RW for amb. Pt was able to amb 2 blocks (there and back) daily PTA without AD. Recommending SNF at this time due to patient with increased responsibilities at home due to wife being disabled and daughter being slow. Pt declining SNF at this time due to desiring to return home with family. Spoke with SW who has set up a Engineer, manufacturing, [place] RN and [place] PT. Acute PT to work with pt to attempt advance mobility for safe d/c home per patient desire however concerned that pt will need RW and home O2 upon d/c making ADLs and house chores more difficulty and potentially unsafe. Acute PT to con't to monitor and assess pt progress.    PT Assessment  Patient needs continued PT services    Follow Up Recommendations  Home health PT;Supervision/Assistance - 24 hour    Does the patient have the potential to tolerate intense rehabilitation      Barriers to Discharge Decreased caregiver support pt's spouse disabled as well as daughter who is mentally slow    Equipment Recommendations  Rolling walker with 5" wheels    Recommendations for Other Services     Frequency Min 3X/week    Precautions / Restrictions Precautions Precautions: None Restrictions Weight Bearing Restrictions: No   Pertinent Vitals/Pain Pt denies pain      Mobility  Bed Mobility Bed Mobility: Not assessed (pt up in chair) Transfers Transfers: Sit to Stand;Stand to Sit Sit to Stand: 4: Min guard;With upper extremity assist;With armrests;From chair/3-in-1 Stand to  Sit: 5: Supervision;With upper extremity assist;With armrests;To chair/3-in-1 Details for Transfer Assistance: v/c's to push up from chair not from walker Ambulation/Gait Ambulation/Gait Assistance: 4: Min assist Ambulation Distance (Feet): 50 Feet Assistive device: Rolling walker Ambulation/Gait Assistance Details: attempted to amb without RW however pt requested to use RW stating "I think i'll do better." Pt with + SOB during amb, SpO2 at 88-89% on 3 LO2 via [place] during amb. pt quickly fatigued Gait Pattern: Step-through pattern;Decreased stride length;Trunk flexed Gait velocity: slow General Gait Details: no episodes of LOB Stairs: No    Exercises     PT Diagnosis: Difficulty walking;Generalized weakness  PT Problem List: Decreased strength;Decreased range of motion;Decreased activity tolerance;Decreased balance;Decreased mobility PT Treatment Interventions: DME instruction;Gait training;Stair training;Functional mobility training;Therapeutic activities;Therapeutic exercise     PT Goals(Current goals can be found in the care plan section) Acute Rehab PT Goals Patient Stated Goal: home with wife and daughter PT Goal Formulation: With patient Time For Goal Achievement: 06/16/13 Potential to Achieve Goals: Good  Visit Information  Last PT Received On: 06/09/13 Assistance Needed: +1 History of Present Illness: 67 y.o. male with history of ESRD on hemodialysis MWF, moderate CAD, diabetes on insulin, hypertension presents to the ER today with the above complaint.       Prior [place] expects to be discharged to:: Private residence Living Arrangements: Spouse/significant other (who is disabled and used wc) Available Help at Discharge: Family;Available 24 hours/day (however pt cares for wife and dtr is mentally slow) Type of Home: House Home Access: Stairs  to enter Entrance Stairs-Number of Steps: 1 Home Layout: Two level Alternate Level Stairs-Number  of Steps: 14 Alternate Level Stairs-Rails: Left Home Equipment: None Prior Function Level of Independence: Independent Communication Communication: No difficulties Dominant Hand: Right    Cognition  Cognition Arousal/Alertness: Awake/alert Behavior During Therapy: WFL for tasks assessed/performed Overall Cognitive Status: Within Functional Limits for tasks assessed    Extremity/Trunk Assessment Upper Extremity Assessment Upper Extremity Assessment: Overall WFL for tasks assessed Lower Extremity Assessment Lower Extremity Assessment: Overall WFL for tasks assessed Cervical / Trunk Assessment Cervical / Trunk Assessment: Normal   Balance    End of Session PT - End of Session Equipment Utilized During Treatment: Gait belt;Oxygen Activity Tolerance: Patient limited by fatigue Patient left: in chair;with call bell/phone within reach Nurse Communication: Mobility status  GP     Kingsley Callander 06/09/2013, 12:24 PM   Kittie Plater, PT, DPT Pager #: [phone] Office #: [phone]

## 2013-06-09 NOTE — Progress Notes (Signed)
CRITICAL VALUE ALERT  Critical value received: K = 6.5  Date of notification:  06/09/13  Time of notification: 0543  Critical value read back:Yes  Nurse who received alert: Oande MD notified (1st page): On call Tylene Fantasia Time of first page: 0545 MD notified (2nd page):  Time of second page:  Responding MD: Tylene Fantasia Time MD responded:  [phone]

## 2013-06-09 NOTE — Progress Notes (Signed)
[facility] Progress Note [place] TEAM 1 - Stepdown ICU Team   Ernest [name] [id number] DOB: 1946/03/21 DOA: 06/08/2013 PCP: Louis Meckel, MD  Brief narrative: 67 y.o. male with history of ESRD on hemodialysis MWF, moderate CAD, diabetes on insulin, and hypertension who presented to the ER with complaints of SOB. He completed his dialysis on Friday, subsequently had increasing shortness of breath for 2 days. Denied any chest pain, cough congestion fevers or chills. His breathing had worsened and he was sent to the ER by EMS. He reported compliance with medications and salt/fluid restriction, denied any PND he reported mild orthopnea, no lower extremity edema. Upon evaluation in the ER he was noted to have acute respiratory failure/pulmonary edema requiring BiPAP, hyperkalemia and elevated troponin.  Assessment/Plan:    Acute respiratory failure with hypoxia/Volume overload -Nephro notes last HD 8/22 and pt left at his dry weight- may need to drop dry weight now -pt has improved since admit and has weaned from BiPAP to [place] oxygen but continues to desat to 88% on RA so seems may be volume up (see below) -cont to wean O2 as tolerated    Elevation of cardiac enzymes/CAD -last cath 2013 which showed 50% eccentric ostial left main stenosis, 50-70% stenosis in prox LAD, 20% ramus and 50% proximal RCA and medical management was recommended.-Cards says no indication to pursue cath despite ECHO findings this admission and focus instead on med rxn -Cards following and note elevated enzymes likely due to acute volume overload -cont Plavix and Coreg   Acute systolic heart failure/Grade 1 DD -as above/primary tx will be HD and meds ECHO demonstrates significant decrease in EF from 60% to 30-35% with grade 1 DD- also with new WMA in septum - last cath 11/13Left mainstem: There is a 50% eccentric ostial left main stenosis.  Left anterior descending (LAD): The LAD is heavily calcified  throughout the proximal vessel. There is 50-70% stenosis in the proximal vessel followed by segment of 40% disease in the proximal to mid vessel.  The ramus intermediate branch is moderate in size and has mild disease at the ostium up to 30%.  Left circumflex (LCx): The left circumflex has minor irregularities up to 20%.  Right coronary artery (RCA): The right coronary has a 50% stenosis in the proximal vessel. The remainder of the vessel is without significant disease.  Left ventriculography: There is apical hypokinesis with mild left ventricular dysfunction. Ejection fraction is estimated at 50%.     CKD (chronic kidney disease) stage V requiring chronic dialysis -as above -HD for today - K+ DID NOT return to normal after HD -given new systolic heart failure may require lower dry weight (see above)    Diabetes mellitus -CBG controlled -continue NPH insulin w/ SSI  Abnormal blood cx's -1/2 positive for GPC's but given no fever or leukocytosis will wait on final cx and not begin empiric tx   HTN -cont BB and ARB    Mixed hyperlipidemia -cont Zocor    Suspected OHS/SA -needs outpt sleep study as he almost assuredly has OHS/SA   DVT prophylaxis: heparin SQ Code Status: Full Family Communication: Patient- concerned over potential needs after dc so PT/OT eval requested and case manager consulted for [facility] services Disposition Plan/Expected LOS: Transfer to Walker telemetry  Consultants: Nephrology Cardiology  Procedures: 2-D echocardiogram: - Left ventricle: The cavity size was normal. Wall thickness was increased in a pattern of mild LVH. Systolic function was moderately to severely reduced. The estimated ejection fraction  was in the range of 30% to 35%. There is hypokinesis of the anteroseptal and inferoseptal myocardium. Doppler parameters are consistent with abnormal left ventricular relaxation (grade 1 diastolic dysfunction). - Mitral valve: Calcified annulus. Mildly  thickened leaflets   Antibiotics: Levaquin x1 dose in the emergency department  HPI/Subjective: Patient alert and endorses much improved respiratory symptoms. Denies chest pain.  Objective: Blood pressure 106/53, pulse 109, temperature 98.5 F (36.9 C), temperature source Oral, resp. rate 17, height 5\' 9"  (1.753 m), weight 116.6 kg (257 lb 0.9 oz), SpO2 97.00%.  Intake/Output Summary (Last 24 hours) at 06/09/13 1106 Last data filed at 06/09/13 0800  Gross per 24 hour  Intake   1110 ml  Output   4002 ml  Net  -2892 ml     Exam: General: No acute respiratory distress Lungs: Clear to auscultation bilaterally without wheezes or crackles, 4 L Cardiovascular: Regular rate, mildly tachycardic and rhythm without murmur gallop or rub normal S1 and S2, no peripheral edema or JVD Abdomen: Nontender, nondistended, soft, bowel sounds positive, no rebound, no ascites, no appreciable mass Musculoskeletal: No significant cyanosis, clubbing of bilateral lower extremities Neurological: Alert and oriented x 3, moves all extremities x 4 without focal neurological deficits, CN 2-12 intact  Scheduled Meds:  Scheduled Meds: . allopurinol  100 mg Oral Daily  . amitriptyline  25 mg Oral QHS  . aspirin  325 mg Oral Daily  . calcium acetate  667 mg Oral TID WC  . carvedilol  6.25 mg Oral BID WC  . cinacalcet  60 mg Oral Q breakfast  . clopidogrel  75 mg Oral QAC breakfast  . dextrose  25 mL Intravenous Once  . doxercalciferol  2 mcg Intravenous Q M,W,F-HD  . heparin subcutaneous  5,000 Units Subcutaneous Q8H  . insulin aspart  10 Units Intravenous Once  . insulin aspart  15 Units Subcutaneous TID WC  . insulin NPH  20 Units Subcutaneous BID AC & HS  . multivitamin  1 tablet Oral Daily  . nitroGLYCERIN  1 inch Topical Once  . sevelamer carbonate  2,400 mg Oral TID WC  . simvastatin  20 mg Oral QHS   Continuous Infusions:    Data Reviewed: Basic Metabolic Panel:  Recent Labs Lab  06/08/13 0241 06/08/13 0438 06/09/13 0340 06/09/13 0812  NA 136 135 134* 138  K 5.4* 6.5* 6.5* 5.9*  CL 94* 102 96 98  CO2 22  --  27 27  GLUCOSE 143* 187* 114* 114*  BUN 51* 52* 35* 36*  CREATININE 10.63* 10.90* 7.77* 8.45*  CALCIUM 9.7  --  9.0 9.3   Liver Function Tests: No results found for this basename: AST, ALT, ALKPHOS, BILITOT, PROT, ALBUMIN,  in the last 168 hours  CBC:  Recent Labs Lab 06/08/13 0240 06/08/13 0438 06/09/13 0340  WBC 13.0*  --  9.8  HGB 11.5* 12.6* 11.3*  HCT 34.1* 37.0* 35.0*  MCV 91.2  --  92.8  PLT 259  --  245   Cardiac Enzymes:  Recent Labs Lab 06/08/13 0241 06/08/13 0437 06/08/13 0826 06/08/13 1440 06/08/13 1825  CKTOTAL  --  209  --   --   --   CKMB  --  6.9*  --   --   --   TROPONINI 0.49* 0.56* 0.55* 0.88* 0.61*   BNP (last 3 results)  Recent Labs  06/08/13 0240  PROBNP 12766.0*   CBG:  Recent Labs Lab 06/08/13 0204 06/08/13 1638 06/09/13 0800  GLUCAP 86  100* 110*    Recent Results (from the past 240 hour(s))  CULTURE, BLOOD (ROUTINE X 2)     Status: None   Collection Time    06/08/13  3:45 AM      Result Value Range Status   Specimen Description BLOOD RIGHT HAND   Final   Special Requests BOTTLES DRAWN AEROBIC AND ANAEROBIC [facility] EACH   Final   Culture  Setup Time     Final   Value: 06/08/2013 10:57     Performed at [facility]   Culture     Final   Value: GRAM POSITIVE COCCI IN CLUSTERS     Note: Gram Stain Report Called to,Read Back By and Verified With: KRISTAL R @ 0825 06/09/13 BY KRAWS     Performed at [facility]   Report Status PENDING   Incomplete  CULTURE, BLOOD (ROUTINE X 2)     Status: None   Collection Time    06/08/13  4:30 AM      Result Value Range Status   Specimen Description BLOOD RIGHT HAND   Final   Special Requests BOTTLES DRAWN AEROBIC ONLY 8CC   Final   Culture  Setup Time     Final   Value: 06/08/2013 10:57     Performed at [facility]   Culture      Final   Value:        BLOOD CULTURE RECEIVED NO GROWTH TO DATE CULTURE WILL BE HELD FOR 5 DAYS BEFORE ISSUING A FINAL NEGATIVE REPORT     Performed at [facility]   Report Status PENDING   Incomplete  MRSA PCR SCREENING     Status: None   Collection Time    06/08/13  8:16 AM      Result Value Range Status   MRSA by PCR NEGATIVE  NEGATIVE Final   Comment:            The GeneXpert MRSA Assay (FDA     approved for NASAL specimens     only), is one component of a     comprehensive MRSA colonization     surveillance program. It is not     intended to diagnose MRSA     infection nor to guide or     monitor treatment for     MRSA infections.    Studies:  Recent x-ray studies have been reviewed in detail by the Attending Physician  Erin Hearing, ANP [facility] Office  [phone] Pager [phone]  **If unable to reach the above provider after paging please contact the [facility] @ [phone]  On-Call/Text Page:      [email or web address]      password [email or web address]  If 7PM-7AM, please contact night-coverage [email or web address] Password [facility] 06/09/2013, 11:06 AM   LOS: 1 day    I have examined the patient, reviewed the chart and modified the above note which I agree with.   Murdock Jellison,MD [id number] 06/09/2013, 5:37 PM

## 2013-06-09 NOTE — Progress Notes (Signed)
Renal Service Daily Progress Note  Subjective: Feeling better, up in chair, walked with PT but sat's dropped after walking w O2  Physical Exam:  Blood pressure 106/53, pulse 109, temperature 98.5 F (36.9 C), temperature source Oral, resp. rate 17, height 5\' 9"  (1.753 m), weight 116.6 kg (257 lb 0.9 oz), SpO2 97.00%. Gen: up in chair, no distress Neck: no jvd Chest: clear to bases bilat Cor: reg no rub or M Abd: obese, nontender, no HSM Ext: no LE edema Access: LUA AVG patent and L IJ cath  Dialysis Orders: [facility]WF [facility]  4hr 15 min  Dry wt 117.5kg   Bath 2K/2Ca  Heparin 6000 bolus > 3000 mid  Lt PC/ Mature L AVG placed 7/22 Hectorol 2 mcg IV/HD Epogen 0 Units IV/HD Venofer 0  Labs: Hgb 12.6, Phos 6.7, PTH 263.8 Tsat 34% on 7/23   Assessment/Plan:  1. Pulm edema / SOB /^trop- dyspnea improved with HD, cont to challenge volume, repeat cxr today. No plans for recath per cards, cont med Rx 2. Hyperkalemia- K down 5.9, plan HD tomorrow 3. ESRD, cont mwf HD, remove IJ cath today 4. HTN- cont coreg, d/c ntp 5. Anemia - Hgb 12.6, no ESA 6. MBD- phos up, last PTH 263, cont binders x 2, Vit D and Sensipar 60 7. Nutrition - NPO for now. Renal diet when appropriate. Multivitamin 8. DM Type II - on insulin 9. CAD - moderate by cath in 2013, medical Rx   Plan- HD in am, d/c ntp, low K bath, lower dry wt  Kelly Splinter  MD Pager [phone]    Cell  [phone] 06/09/2013, 11:39 AM    Recent Labs Lab 06/08/13 0241 06/08/13 0438 06/09/13 0340 06/09/13 0812  NA 136 135 134* 138  K 5.4* 6.5* 6.5* 5.9*  CL 94* 102 96 98  CO2 22  --  27 27  GLUCOSE 143* 187* 114* 114*  BUN 51* 52* 35* 36*  CREATININE 10.63* 10.90* 7.77* 8.45*  CALCIUM 9.7  --  9.0 9.3   No results found for this basename: AST, ALT, ALKPHOS, BILITOT, PROT, ALBUMIN,  in the last 168 hours  Recent Labs Lab 06/08/13 0240 06/08/13 0438 06/09/13 0340  WBC 13.0*  --  9.8  HGB 11.5* 12.6* 11.3*  HCT 34.1* 37.0* 35.0*   MCV 91.2  --  92.8  PLT 259  --  245   . allopurinol  100 mg Oral Daily  . amitriptyline  25 mg Oral QHS  . aspirin  325 mg Oral Daily  . calcium acetate  667 mg Oral TID WC  . carvedilol  6.25 mg Oral BID WC  . cinacalcet  60 mg Oral Q breakfast  . clopidogrel  75 mg Oral QAC breakfast  . dextrose  25 mL Intravenous Once  . doxercalciferol  2 mcg Intravenous Q M,W,F-HD  . heparin subcutaneous  5,000 Units Subcutaneous Q8H  . insulin aspart  10 Units Intravenous Once  . insulin aspart  15 Units Subcutaneous TID WC  . insulin NPH  20 Units Subcutaneous BID AC & HS  . multivitamin  1 tablet Oral Daily  . nitroGLYCERIN  1 inch Topical Once  . sevelamer carbonate  2,400 mg Oral TID WC  . simvastatin  20 mg Oral QHS     acetaminophen, albuterol, colchicine, ondansetron (ZOFRAN) IV

## 2013-06-10 DIAGNOSIS — E162 Hypoglycemia, unspecified: Secondary | ICD-10-CM | POA: Diagnosis not present

## 2013-06-10 DIAGNOSIS — I255 Ischemic cardiomyopathy: Secondary | ICD-10-CM | POA: Diagnosis present

## 2013-06-10 DIAGNOSIS — N186 End stage renal disease: Secondary | ICD-10-CM

## 2013-06-10 DIAGNOSIS — R0602 Shortness of breath: Secondary | ICD-10-CM | POA: Diagnosis not present

## 2013-06-10 DIAGNOSIS — I2510 Atherosclerotic heart disease of native coronary artery without angina pectoris: Secondary | ICD-10-CM | POA: Diagnosis not present

## 2013-06-10 DIAGNOSIS — J9600 Acute respiratory failure, unspecified whether with hypoxia or hypercapnia: Secondary | ICD-10-CM | POA: Diagnosis not present

## 2013-06-10 DIAGNOSIS — J811 Chronic pulmonary edema: Secondary | ICD-10-CM | POA: Diagnosis not present

## 2013-06-10 LAB — GLUCOSE, CAPILLARY: Glucose-Capillary: 96 mg/dL (ref 70–99)

## 2013-06-10 LAB — RENAL FUNCTION PANEL
CO2: 26 mEq/L (ref 19–32)
Calcium: 9.2 mg/dL (ref 8.4–10.5)
Chloride: 94 mEq/L — ABNORMAL LOW (ref 96–112)
GFR calc Af Amer: 5 mL/min — ABNORMAL LOW (ref >90)
GFR calc non Af Amer: 4 mL/min — ABNORMAL LOW (ref >90)
Sodium: 134 mEq/L — ABNORMAL LOW (ref 135–145)

## 2013-06-10 LAB — HEMOGLOBIN A1C: Mean Plasma Glucose: 143 mg/dL — ABNORMAL HIGH (ref <117)

## 2013-06-10 MED ORDER — VANCOMYCIN HCL 10 G IV SOLR
2500.0000 mg | Freq: Once | INTRAVENOUS | Status: AC
  Administered 2013-06-10: 2500 mg via INTRAVENOUS
  Filled 2013-06-10 (×2): qty 2500

## 2013-06-10 MED ORDER — VANCOMYCIN HCL IN DEXTROSE 1-5 GM/200ML-% IV SOLN
1000.0000 mg | INTRAVENOUS | Status: DC
  Administered 2013-06-12: 1000 mg via INTRAVENOUS
  Filled 2013-06-10 (×2): qty 200

## 2013-06-10 MED ORDER — LOSARTAN POTASSIUM 25 MG PO TABS
25.0000 mg | ORAL_TABLET | Freq: Every day | ORAL | Status: DC
  Administered 2013-06-12: 25 mg via ORAL
  Filled 2013-06-10 (×2): qty 1

## 2013-06-10 MED ORDER — LIDOCAINE HCL (PF) 1 % IJ SOLN: 5.0000 mL | INTRAMUSCULAR | Status: DC | PRN

## 2013-06-10 MED ORDER — HEPARIN SODIUM (PORCINE) 1000 UNIT/ML DIALYSIS
4500.0000 [IU] | INTRAMUSCULAR | Status: DC | PRN
  Administered 2013-06-10: 4500 [IU] via INTRAVENOUS_CENTRAL

## 2013-06-10 MED ORDER — DOXERCALCIFEROL 4 MCG/2ML IV SOLN
INTRAVENOUS | Status: AC
  Administered 2013-06-10: 2 ug via INTRAVENOUS
  Filled 2013-06-10: qty 2

## 2013-06-10 MED ORDER — NEPRO/CARBSTEADY PO LIQD: 237.0000 mL | ORAL | Status: DC | PRN

## 2013-06-10 MED ORDER — LISINOPRIL 5 MG PO TABS: 5.0000 mg | ORAL_TABLET | Freq: Every day | ORAL | Status: DC

## 2013-06-10 MED ORDER — ALTEPLASE 2 MG IJ SOLR
2.0000 mg | Freq: Once | INTRAMUSCULAR | Status: DC | PRN
  Filled 2013-06-10: qty 2

## 2013-06-10 MED ORDER — PENTAFLUOROPROP-TETRAFLUOROETH EX AERO: 1.0000 "application " | INHALATION_SPRAY | CUTANEOUS | Status: DC | PRN

## 2013-06-10 MED ORDER — SODIUM CHLORIDE 0.9 % IV SOLN: 100.0000 mL | INTRAVENOUS | Status: DC | PRN

## 2013-06-10 MED ORDER — LIDOCAINE-PRILOCAINE 2.5-2.5 % EX CREA: 1.0000 "application " | TOPICAL_CREAM | CUTANEOUS | Status: DC | PRN

## 2013-06-10 MED ORDER — HEPARIN SODIUM (PORCINE) 1000 UNIT/ML DIALYSIS: 1000.0000 [IU] | INTRAMUSCULAR | Status: DC | PRN

## 2013-06-10 MED ORDER — LOSARTAN POTASSIUM 25 MG PO TABS: 25.0000 mg | ORAL_TABLET | Freq: Every day | ORAL | Status: DC

## 2013-06-10 NOTE — Progress Notes (Signed)
[facility]                                                                                Patient Demographics  Ernest [name], is a 67 y.o. male, DOB - 29-Nov-1945, [id number], [id number]  Admit date - 06/08/2013  Admitting Physician [name], MD  Outpatient Primary MD for the patient is Louis Meckel, MD  LOS - 2   Chief Complaint  Patient presents with  . Shortness of Breath  . Hypoglycemia       Brief narrative:  67 y.o. male with history of ESRD on hemodialysis MWF, moderate CAD, diabetes on insulin, and hypertension who presented to the ER with complaints of SOB. He completed his dialysis on Friday, subsequently had increasing shortness of breath for 2 days. Denied any chest pain, cough congestion fevers or chills. His breathing had worsened and he was sent to the ER by EMS. He reported compliance with medications and salt/fluid restriction, denied any PND he reported mild orthopnea, no lower extremity edema. Upon evaluation in the ER he was noted to have acute respiratory failure/pulmonary edema requiring BiPAP, hyperkalemia and elevated troponin. He is been dialyzed for fluid removal by nephrology, cardiology is following the patient for medical management of his CHF and underlying history of CAD.    Assessment/Plan:   Acute respiratory failure with hypoxia/Volume overload due to Acute on chronic combined systolic and diastolic heart in a patient with ESRD on Monday Wednesday Friday dialysis.  Continues to improve with dialysis and fluid removal, getting close to dry weight, off of BiPAP and oxygen being titrated down, cardiology and renal following the patient. He is currently on Coreg aspirin, Plavix along with statin. Will resume low dose ARB if tolerated by blood pressure.     Elevation of cardiac enzymes/CAD  -last cath 2013 which showed 50% eccentric ostial left main stenosis, 50-70% stenosis in prox LAD, 20% ramus and 50% proximal RCA and  medical management was recommended.-Cards says no indication to pursue cath despite ECHO findings this admission and focus instead on med rxn  -Cards following and note elevated enzymes likely due to acute volume overload  -cont aspirin, statin, Plavix and Coreg, patient remains pain and symptom free at this time      Acute systolic heart failure/Grade 1 DD  -as above/primary tx will be HD and meds   ECHO demonstrates significant decrease in EF from 60% to 30-35% with grade 1 DD- also with new WMA in septum   - last cath 11/13  Left mainstem: There is a 50% eccentric ostial left main stenosis.   Left anterior descending (LAD): The LAD is heavily calcified throughout the proximal vessel. There is 50-70% stenosis in the proximal vessel followed by segment of 40% disease in the proximal to mid vessel.  The ramus intermediate branch is moderate in size and has mild disease at the ostium up to 30%.  Left circumflex (LCx): The left circumflex has minor irregularities up to 20%.  Right coronary artery (RCA): The right coronary has a 50% stenosis in the proximal vessel. The remainder of the vessel is without significant disease.  Left ventriculography: There is apical hypokinesis with mild left ventricular  dysfunction. Ejection fraction is estimated at 50%.     ESRD on dialysis Monday Wednesday Friday Renal following Monday Wednesday Friday dialysis.     Diabetes mellitus  -CBG controlled  -continue NPH insulin w/ SSI   No results found for this basename: HGBA1C    CBG (last 3)   Recent Labs  06/09/13 1203 06/09/13 1701 06/09/13 2134  GLUCAP 100* 93 118*      Abnormal blood cx's  -1/2 positive for GPC's which is coag negative staph likely contamination,given no fever or leukocytosis will wait on final cx and not begin empiric tx, on it her clinically.     HTN  -cont BB and ARB     Mixed hyperlipidemia  -cont Zocor    Suspected OHS/SA  -needs outpt sleep study  as he almost assuredly has OHS/SA    Code Status: Full  Family Communication: none present  Disposition Plan: HHPT   Procedures    2-D echocardiogram:   - Left ventricle: The cavity size was normal. Wall thickness was increased in a pattern of mild LVH. Systolic function was moderately to severely reduced. The estimated ejection fraction was in the range of 30% to 35%. There is hypokinesis of the anteroseptal and inferoseptal myocardium. Doppler parameters are consistent with abnormal left ventricular relaxation (grade 1 diastolic dysfunction). - Mitral valve: Calcified annulus. Mildly thickened leaflets     Consults  Renal, cards   DVT Prophylaxis    Heparin   Lab Results  Component Value Date   PLT 245 06/09/2013    Medications  Scheduled Meds: . allopurinol  100 mg Oral Daily  . amitriptyline  25 mg Oral QHS  . aspirin  325 mg Oral Daily  . calcium acetate  667 mg Oral TID WC  . carvedilol  6.25 mg Oral BID WC  . cinacalcet  60 mg Oral Q breakfast  . clopidogrel  75 mg Oral QAC breakfast  . dextrose  25 mL Intravenous Once  . doxercalciferol  2 mcg Intravenous Q M,W,F-HD  . heparin subcutaneous  5,000 Units Subcutaneous Q8H  . insulin aspart  10 Units Intravenous Once  . insulin aspart  15 Units Subcutaneous TID WC  . insulin NPH  20 Units Subcutaneous BID AC & HS  . multivitamin  1 tablet Oral Daily  . sevelamer carbonate  2,400 mg Oral TID WC  . simvastatin  20 mg Oral QHS   Continuous Infusions:  PRN Meds:.sodium chloride, sodium chloride, acetaminophen, albuterol, alteplase, colchicine, feeding supplement (NEPRO CARB STEADY), heparin, [START ON 06/11/2013] heparin, lidocaine (PF), lidocaine-prilocaine, ondansetron (ZOFRAN) IV, pentafluoroprop-tetrafluoroeth  Antibiotics     Anti-infectives   Start     Dose/Rate Route Frequency Ordered Stop   06/08/13 0315  levofloxacin (LEVAQUIN) IVPB 750 mg     750 mg 100 mL/hr over 90 Minutes Intravenous  Once  06/08/13 0301 06/08/13 0556       Time Spent in minutes  40   SINGH,PRASHANT K M.D on 06/10/2013 at 10:16 AM  Between 7am to 7pm - Pager - [phone]  After 7pm go to [email or web address] - password [email or web address]  And look for the night coverage person covering for me after hours  [facility] Office  [phone]    Subjective:   Ernest [name] today has, No headache, No chest pain, No abdominal pain - No Nausea, No new weakness tingling or numbness, No Cough - SOB.    Objective:   Filed Vitals:   06/10/13 [id number] 06/10/13 [id number] 06/10/13 [id number]  06/10/13 0957  BP: 83/46 103/53 78/32 111/47  Pulse:  93 100 98  Temp:      TempSrc:      Resp:  12 17 22   Height:      Weight:      SpO2:        Wt Readings from Last 3 Encounters:  06/10/13 115.9 kg (255 lb 8.2 oz)  05/05/13 117.255 kg (258 lb 8 oz)  05/05/13 117.255 kg (258 lb 8 oz)     Intake/Output Summary (Last 24 hours) at 06/10/13 1016 Last data filed at 06/09/13 1400  Gross per 24 hour  Intake    240 ml  Output      2 ml  Net    238 ml    Exam Awake Alert, Oriented X 3, No new F.N deficits, Normal affect [place].AT,PERRAL Supple Neck,No JVD, No cervical lymphadenopathy appriciated.  Symmetrical Chest wall movement, Good air movement bilaterally, +ve rales RRR,No Gallops,Rubs or new Murmurs, No Parasternal Heave +ve B.Sounds, Abd Soft, Non tender, No organomegaly appriciated, No rebound - guarding or rigidity. No Cyanosis, Clubbing or edema, No new Rash or bruise     Data Review   Micro Results Recent Results (from the past 240 hour(s))  CULTURE, BLOOD (ROUTINE X 2)     Status: None   Collection Time    06/08/13  3:45 AM      Result Value Range Status   Specimen Description BLOOD RIGHT HAND   Final   Special Requests BOTTLES DRAWN AEROBIC AND ANAEROBIC 5CC EACH   Final   Culture  Setup Time     Final   Value: 06/08/2013 10:57     Performed at [facility]   Culture     Final   Value: STAPHYLOCOCCUS  SPECIES (COAGULASE NEGATIVE)     Note: RIFAMPIN AND GENTAMICIN SHOULD NOT BE USED AS SINGLE DRUGS FOR TREATMENT OF STAPH INFECTIONS.     Note: Gram Stain Report Called to,Read Back By and Verified With: KRISTAL R @ 0825 06/09/13 BY KRAWS     Performed at [facility]   Report Status PENDING   Incomplete  CULTURE, BLOOD (ROUTINE X 2)     Status: None   Collection Time    06/08/13  4:30 AM      Result Value Range Status   Specimen Description BLOOD RIGHT HAND   Final   Special Requests BOTTLES DRAWN AEROBIC ONLY 8CC   Final   Culture  Setup Time     Final   Value: 06/08/2013 10:57     Performed at [facility]   Culture     Final   Value: GRAM POSITIVE COCCI IN CLUSTERS     STAPHYLOCOCCUS SPECIES (COAGULASE NEGATIVE)     Note: Gram Stain Report Called to,Read Back By and Verified With: KRISTAL R @ 1520 06/09/13 BY KRAWS     Performed at [facility]   Report Status PENDING   Incomplete  MRSA PCR SCREENING     Status: None   Collection Time    06/08/13  8:16 AM      Result Value Range Status   MRSA by PCR NEGATIVE  NEGATIVE Final   Comment:            The GeneXpert MRSA Assay (FDA     approved for NASAL specimens     only), is one component of a     comprehensive MRSA colonization     surveillance program.  It is not     intended to diagnose MRSA     infection nor to guide or     monitor treatment for     MRSA infections.    Radiology Reports Dg Chest 2 View  06/09/2013   *RADIOLOGY REPORT*  Clinical Data: Follow up of pulmonary edema.  Weakness.  Shortness of breath.  CHEST - 2 VIEW  Comparison: 1 day prior  Findings: A dialysis catheter which terminates in the right atrium.  Cardiomegaly, accentuated by low lung volumes on the frontal. Small bilateral pleural effusions, likely similar. No pneumothorax. Density difference over the lateral left hemithorax is favored to be due to a skin fold.  Slight improvement in moderate interstitial edema.  Persistent left  greater than right bibasilar airspace disease.  Vascular stent projecting over the right upper lung.  IMPRESSION: Slight improvement in moderate congestive heart failure.  Small bilateral pleural effusions with adjacent airspace disease, likely atelectasis.  The left base air space disease is slightly progressive, and developing infection is difficult to exclude.  Probable skin fold over the lateral left hemithorax.   Original Report Authenticated By: Abigail Miyamoto, M.D.   Dg Chest Portable 1 View  06/08/2013   *RADIOLOGY REPORT*  Clinical Data: Shortness of breath  PORTABLE CHEST - 1 VIEW  Comparison: 03/26/2013  Findings: Heart size upper normal to mildly enlarged with central vascular congestion.  Interstitial prominence and perihilar increased markings.  Left lung consolidation.  Layering pleural effusions not excluded.  Multilevel degenerative change.  No pneumothorax.  Left sided large bore catheter with tips that project over the cavoatrial junction.  No interval osseous change.  IMPRESSION: Central vascular congestion and pulmonary edema pattern.  Left lung base opacity; atelectasis versus infiltrate.   Original Report Authenticated By: Carlos Levering, M.D.    [place] [facility] 06/08/13 0240 06/08/13 0438 06/09/13 0340  WBC 13.0*  --  9.8  HGB 11.5* 12.6* 11.3*  HCT 34.1* 37.0* 35.0*  PLT 259  --  245  MCV 91.2  --  92.8  MCH 30.7  --  30.0  MCHC 33.7  --  32.3  RDW 14.4  --  14.6    Chemistries   Recent Labs Lab 06/08/13 0241 06/08/13 0438 06/09/13 0340 06/09/13 0812 06/10/13 0500  NA 136 135 134* 138 134*  K 5.4* 6.5* 6.5* 5.9* 5.3*  CL 94* 102 96 98 94*  CO2 22  --  27 27 26   GLUCOSE 143* 187* 114* 114* 118*  BUN 51* 52* 35* 36* 50*  CREATININE 10.63* 10.90* 7.77* 8.45* 10.61*  CALCIUM 9.7  --  9.0 9.3 9.2   ------------------------------------------------------------------------------------------------------------------ estimated creatinine clearance is 8.8  ml/min (by C-G formula based on Cr of 10.61). ------------------------------------------------------------------------------------------------------------------ No results found for this basename: HGBA1C,  in the last 72 hours ------------------------------------------------------------------------------------------------------------------ No results found for this basename: CHOL, HDL, LDLCALC, TRIG, CHOLHDL, LDLDIRECT,  in the last 72 hours ------------------------------------------------------------------------------------------------------------------ No results found for this basename: TSH, T4TOTAL, FREET3, T3FREE, THYROIDAB,  in the last 72 hours ------------------------------------------------------------------------------------------------------------------ No results found for this basename: VITAMINB12, FOLATE, FERRITIN, TIBC, IRON, RETICCTPCT,  in the last 72 hours  Coagulation profile No results found for this basename: INR, PROTIME,  in the last 168 hours  No results found for this basename: DDIMER,  in the last 72 hours  Cardiac Enzymes  Recent Labs Lab 06/08/13 0241 06/08/13 0437 06/08/13 0826 06/08/13 1440 06/08/13 1825  CKMB  --  6.9*  --   --   --  TROPONINI 0.49* 0.56* 0.55* 0.88* 0.61*   ------------------------------------------------------------------------------------------------------------------ No components found with this basename: POCBNP,

## 2013-06-10 NOTE — Procedures (Signed)
I was present at this dialysis session. I have reviewed the session itself and made appropriate changes.   Kelly Splinter  MD Pager [phone]    Cell  [phone] 06/10/2013, 10:16 AM

## 2013-06-10 NOTE — Evaluation (Signed)
Occupational Therapy Evaluation Patient Details Name: RIDHA DELLIGATTI MRN: [id number] DOB: 24-Jul-1946 Today's Date: 06/10/2013 Time: 1500-1530 OT Time Calculation (min): 30 min  OT Assessment / Plan / Recommendation History of present illness 67 y.o. male with history of ESRD on hemodialysis MWF, moderate CAD, diabetes on insulin, hypertension presents to the ER today with the above complaint.   Clinical Impression   Pt is currently at a supervision to min guard assist level for selfcare tasks.  Still with limited endurance and dependency for O2 to keep sats greater than 90% with activity.  Feel pt will benefit from acute care OT to reach modified independent to independent level for selfcare tasks.  Do not feel he will need post acute OT but will continue to follow while in the hospital or until goals are met.    OT Assessment  Patient needs continued OT Services    Follow Up Recommendations  No OT follow up    Barriers to Discharge Decreased caregiver support    Equipment Recommendations  None recommended by OT       Frequency  Min 2X/week    Precautions / Restrictions Precautions Precautions: Fall Restrictions Weight Bearing Restrictions: No   Pertinent Vitals/Pain O2 sats 99% on 3Ls at rest decreasing to 80% on room air with activity    ADL  Eating/Feeding: Simulated;Independent Where Assessed - Eating/Feeding: Chair Grooming: Performed;Wash/dry hands;Wash/dry face;Teeth care;Supervision/safety Where Assessed - Grooming: Unsupported standing Upper Body Bathing: Simulated;Set up Where Assessed - Upper Body Bathing: Unsupported sitting Lower Body Bathing: Simulated;Supervision/safety Where Assessed - Lower Body Bathing: Supported sit to stand Upper Body Dressing: Simulated;Set up Where Assessed - Upper Body Dressing: Unsupported sitting Lower Body Dressing: Supervision/safety;Performed Where Assessed - Lower Body Dressing: Supported sit to Lobbyist:  Building services engineer Method: Other (comment) (ambulate without assistive device) Science writer: Regular height toilet Toileting - Clothing Manipulation and Hygiene: Simulated;Supervision/safety Where Assessed - Toileting Clothing Manipulation and Hygiene: Sit to stand from 3-in-1 or toilet Tub/Shower Transfer Method: Not assessed Equipment Used: Gait belt Transfers/Ambulation Related to ADLs: Pt overall min guard to supervision level for mobility to the sink and the bathrooom without use of assistive device. ADL Comments: Pt currently presents at a supervision level for simulated selfcare tasks.  He reports that his wife has a shower seat he can use since she is mostly wheelchair dependent.  O2 sats decreased to 80% on room air with standing at the sink.  Needed 3Ls O2 via nasal cannula to increase back to 91 %.    OT Diagnosis: Generalized weakness  OT Problem List: Decreased strength;Decreased activity tolerance;Impaired balance (sitting and/or standing);Decreased knowledge of use of DME or AE;Cardiopulmonary status limiting activity OT Treatment Interventions: Self-care/ADL training;Patient/family education;Therapeutic activities;DME and/or AE instruction;Balance training   OT Goals(Current goals can be found in the care plan section) Acute Rehab OT Goals Patient Stated Goal: To go home to take care of his wife and daughter. OT Goal Formulation: With patient Time For Goal Achievement: 06/24/13 Potential to Achieve Goals: Good  Visit Information  Last OT Received On: 06/10/13 Assistance Needed: +1 History of Present Illness: 67 y.o. male with history of ESRD on hemodialysis MWF, moderate CAD, diabetes on insulin, hypertension presents to the ER today with the above complaint.       Prior [place] expects to be discharged to:: Private residence Living Arrangements: Spouse/significant other (who is disabled and  used wc) Available Help at Discharge: Family;Available 24  hours/day (however pt cares for wife and dtr is mentally slow) Type of Home: House Home Access: Stairs to enter [employer] of Steps: 1 Home Layout: Two level Alternate Level Stairs-Number of Steps: 14 Alternate Level Stairs-Rails: Left Home Equipment: None Prior Function Level of Independence: Independent Communication Communication: No difficulties Dominant Hand: Right         Vision/Perception Vision - History Baseline Vision: No visual deficits Patient Visual Report: No change from baseline Vision - Assessment Eye Alignment: Within Functional Limits Vision Assessment: Vision not tested Perception Perception: Within Functional Limits Praxis Praxis: Intact   Cognition  Cognition Arousal/Alertness: Awake/alert Behavior During Therapy: WFL for tasks assessed/performed Overall Cognitive Status: Within Functional Limits for tasks assessed    Extremity/Trunk Assessment Upper Extremity Assessment Upper Extremity Assessment: RUE deficits/detail;LUE deficits/detail RUE Deficits / Details: Pt with AROM shoulder flexion 0-100 degrees.  Pt reporting shoulder injury back in the spring and now presents with limitations but no report of pain.  All other joints AROM WFLs and strength 4/5 LUE Deficits / Details: AROM and strength 4/5 throughout LUE Sensation: decreased light touch Lower Extremity Assessment Lower Extremity Assessment: Defer to PT evaluation Cervical / Trunk Assessment Cervical / Trunk Assessment: Normal     Mobility Bed Mobility Bed Mobility: Supine to Sit;Sit to Supine Supine to Sit: 6: Modified independent (Device/Increase time);HOB elevated Sit to Supine: 6: Modified independent (Device/Increase time);HOB elevated Transfers Transfers: Sit to Stand Sit to Stand: 5: Supervision;From toilet;Without upper extremity assist Stand to Sit: 5: Supervision;With upper extremity assist;To  toilet Details for Transfer Assistance: verbal cues for safety, lines, O2        Balance Balance Balance Assessed: Yes Static Standing Balance Static Standing - Balance Support: No upper extremity supported Static Standing - Level of Assistance: 5: Stand by assistance   End of Session OT - End of Session Equipment Utilized During Treatment: Gait belt Activity Tolerance: Patient limited by fatigue Patient left: in bed;with call bell/phone within reach Nurse Communication: Mobility status     Bells OTR/L Pager number [phone] 06/10/2013, 3:42 PM

## 2013-06-10 NOTE — Progress Notes (Signed)
Physical Therapy Treatment Patient Details Name: Ernest [name] MRN: [id number] DOB: [birth year] Today's Date: 06/10/2013 Time: 1450-1500 PT Time Calculation (min): 10 min  PT Assessment / Plan / Recommendation  History of Present Illness 67 y.o. male with history of ESRD on hemodialysis MWF, moderate CAD, diabetes on insulin, hypertension presents to the ER today with the above complaint.   PT Comments   Pt reports feeling better after HD this morning.  Pt continues to present with decreased activity tolerance and SOB with ambulation (remained on 3L O2 [place]).   Follow Up Recommendations  Home health PT;Supervision/Assistance - 24 hour     Does the patient have the potential to tolerate intense rehabilitation     Barriers to Discharge        Equipment Recommendations  Rolling walker with 5" wheels    Recommendations for Other Services    Frequency     Progress towards PT Goals Progress towards PT goals: Progressing toward goals  Plan Current plan remains appropriate    Precautions / Restrictions Precautions Precautions: Fall Restrictions Weight Bearing Restrictions: No   Pertinent Vitals/Pain n/a   Mobility  Bed Mobility Bed Mobility: Supine to Sit;Sit to Supine Supine to Sit: 6: Modified independent (Device/Increase time);HOB elevated Sit to Supine: 6: Modified independent (Device/Increase time);HOB elevated Transfers Transfers: Sit to Stand;Stand to Sit Sit to Stand: 4: Min guard;With upper extremity assist;From bed Stand to Sit: With upper extremity assist;To bed;4: Min guard Details for Transfer Assistance: verbal cues for safety, lines, O2 Ambulation/Gait Ambulation/Gait Assistance: 4: Min guard Ambulation Distance (Feet): 80 Feet Assistive device: Rolling walker Ambulation/Gait Assistance Details: pt able to increase distance a little from yesterday, continues to fatigue quickly and with SOB during ambulation, remained on 3L O2 [place] throughout Gait Pattern:  Step-through pattern;Decreased stride length;Trunk flexed Gait velocity: quicker pace end of ambulation due to fatigue and trying to get back to bed requiring verbal cues for safety General Gait Details: no episodes of LOB    Exercises     PT Diagnosis:    PT Problem List:   PT Treatment Interventions:     PT Goals (current goals can now be found in the care plan section)    Visit Information  Last PT Received On: 06/10/13 Assistance Needed: +1 History of Present Illness: 67 y.o. male with history of ESRD on hemodialysis MWF, moderate CAD, diabetes on insulin, hypertension presents to the ER today with the above complaint.    Subjective Data      Cognition  Cognition Arousal/Alertness: Awake/alert Behavior During Therapy: WFL for tasks assessed/performed Overall Cognitive Status: Within Functional Limits for tasks assessed    Balance     End of Session PT - End of Session Equipment Utilized During Treatment: Oxygen Activity Tolerance: Patient limited by fatigue Patient left: in bed;with call bell/phone within reach   GP     Ernest [name],Ernest [name] 06/10/2013, 3:12 PM Ernest [name], PT, DPT 06/10/2013 Pager: [phone]

## 2013-06-10 NOTE — Progress Notes (Signed)
Renal Service Daily Progress Note  Subjective: Feeling better, up in chair, walked with PT but sat's dropped after walking w O2  Physical Exam:  Blood pressure 111/47, pulse 98, temperature 97.4 F (36.3 C), temperature source Oral, resp. rate 22, height 5\' 9"  (1.753 m), weight 115.9 kg (255 lb 8.2 oz), SpO2 100.00%. Gen: up in chair, no distress Neck: no jvd Chest: clear to bases bilat Cor: reg no rub or M Abd: obese, nontender, no HSM Ext: no LE edema Access: LUA AVG patent and L IJ cath  Dialysis Orders: MWF @ [facility]  4hr 15 min  Dry wt 117.5kg   Bath 2K/2Ca  Heparin 6000 bolus > 3000 mid  Lt PC/ Mature L AVG placed 7/22 Hectorol 2 mcg IV/HD Epogen 0 Units IV/HD Venofer 0  Labs: Hgb 12.6, Phos 6.7, PTH 263.8 Tsat 34% on 7/23   Assessment:  1. Pulm edema / SOB /^trop- still hypoxic with exertion, and persistent pulm edema on xray last night > further UF as tolerated with HD today 2. Hyperkalemia- K 5.3, HD today 3. ESRD, cont mwf HD, needs removal IJ cath 4. HTN- BP too low for coreg, will d/c 5. Anemia - Hgb 12.6, no ESA 6. MBD- phos up, last PTH 263, cont binders x 2, Vit D and Sensipar 60 7. Nutrition - NPO for now. Renal diet when appropriate. Multivitamin 8. DM Type II - on insulin 9. CAD - moderate disease by cath in 2013, medical Rx 10. Hep C   Plan- HD today, max UF, called [facility] to remove tunneled HD cath, d/c'd coreg   Ernest Splinter  MD Pager [phone]    Cell  [phone] 06/10/2013, 10:17 AM    Recent Labs Lab 06/09/13 0340 06/09/13 0812 06/10/13 0500  NA 134* 138 134*  K 6.5* 5.9* 5.3*  CL 96 98 94*  CO2 27 27 26   GLUCOSE 114* 114* 118*  BUN 35* 36* 50*  CREATININE 7.77* 8.45* 10.61*  CALCIUM 9.0 9.3 9.2  PHOS  --   --  6.4*    Recent Labs Lab 06/10/13 0500  ALBUMIN 2.9*    Recent Labs Lab 06/08/13 0240 06/08/13 0438 06/09/13 0340  WBC 13.0*  --  9.8  HGB 11.5* 12.6* 11.3*  HCT 34.1* 37.0* 35.0*  MCV 91.2  --  92.8  PLT 259  --  245    . allopurinol  100 mg Oral Daily  . amitriptyline  25 mg Oral QHS  . aspirin  325 mg Oral Daily  . calcium acetate  667 mg Oral TID WC  . carvedilol  6.25 mg Oral BID WC  . cinacalcet  60 mg Oral Q breakfast  . clopidogrel  75 mg Oral QAC breakfast  . dextrose  25 mL Intravenous Once  . doxercalciferol  2 mcg Intravenous Q M,W,F-HD  . heparin subcutaneous  5,000 Units Subcutaneous Q8H  . insulin aspart  10 Units Intravenous Once  . insulin aspart  15 Units Subcutaneous TID WC  . insulin NPH  20 Units Subcutaneous BID AC & HS  . multivitamin  1 tablet Oral Daily  . sevelamer carbonate  2,400 mg Oral TID WC  . simvastatin  20 mg Oral QHS     sodium chloride, sodium chloride, acetaminophen, albuterol, alteplase, colchicine, feeding supplement (NEPRO CARB STEADY), heparin, [START ON 06/11/2013] heparin, lidocaine (PF), lidocaine-prilocaine, ondansetron (ZOFRAN) IV, pentafluoroprop-tetrafluoroeth

## 2013-06-10 NOTE — Progress Notes (Signed)
Patient ID: Ernest [name], male   DOB: Feb 26, 1946, 67 y.o.   MRN: [id number]   SUBJECTIVE: Patient is stable on dialysis. Our prior notes have indicated that the cardiology plan is for medical therapy at this time.   Filed Vitals:   06/10/13 0942 06/10/13 0953 06/10/13 0957 06/10/13 1025  BP: 103/53 78/32 111/47 105/42  Pulse: 93 100 98 99  Temp:      TempSrc:      Resp: 12 17 22 17   Height:      Weight:      SpO2:        Intake/Output Summary (Last 24 hours) at 06/10/13 1041 Last data filed at 06/09/13 1400  Gross per 24 hour  Intake    240 ml  Output      2 ml  Net    238 ml    LABS: Basic Metabolic Panel:  Recent Labs  06/09/13 0812 06/10/13 0500  NA 138 134*  K 5.9* 5.3*  CL 98 94*  CO2 27 26  GLUCOSE 114* 118*  BUN 36* 50*  CREATININE 8.45* 10.61*  CALCIUM 9.3 9.2  PHOS  --  6.4*   Liver Function Tests:  Recent Labs  06/10/13 0500  ALBUMIN 2.9*   No results found for this basename: LIPASE, AMYLASE,  in the last 72 hours CBC:  Recent Labs  06/08/13 0240 06/08/13 0438 06/09/13 0340  WBC 13.0*  --  9.8  HGB 11.5* 12.6* 11.3*  HCT 34.1* 37.0* 35.0*  MCV 91.2  --  92.8  PLT 259  --  245   Cardiac Enzymes:  Recent Labs  06/08/13 0241 06/08/13 0437 06/08/13 0826 06/08/13 1440 06/08/13 1825  CKTOTAL  --  209  --   --   --   CKMB  --  6.9*  --   --   --   TROPONINI 0.49* 0.56* 0.55* 0.88* 0.61*   BNP: No components found with this basename: POCBNP,  D-Dimer: No results found for this basename: DDIMER,  in the last 72 hours Hemoglobin A1C: No results found for this basename: HGBA1C,  in the last 72 hours Fasting Lipid Panel: No results found for this basename: CHOL, HDL, LDLCALC, TRIG, CHOLHDL, LDLDIRECT,  in the last 72 hours Thyroid Function Tests: No results found for this basename: TSH, T4TOTAL, FREET3, T3FREE, THYROIDAB,  in the last 72 hours  RADIOLOGY: Dg Chest 2 View  06/09/2013   *RADIOLOGY REPORT*  Clinical Data: Follow up  of pulmonary edema.  Weakness.  Shortness of breath.  CHEST - 2 VIEW  Comparison: 1 day prior  Findings: A dialysis catheter which terminates in the right atrium.  Cardiomegaly, accentuated by low lung volumes on the frontal. Small bilateral pleural effusions, likely similar. No pneumothorax. Density difference over the lateral left hemithorax is favored to be due to a skin fold.  Slight improvement in moderate interstitial edema.  Persistent left greater than right bibasilar airspace disease.  Vascular stent projecting over the right upper lung.  IMPRESSION: Slight improvement in moderate congestive heart failure.  Small bilateral pleural effusions with adjacent airspace disease, likely atelectasis.  The left base air space disease is slightly progressive, and developing infection is difficult to exclude.  Probable skin fold over the lateral left hemithorax.   Original Report Authenticated By: Abigail Miyamoto, M.D.   Dg Chest Portable 1 View  06/08/2013   *RADIOLOGY REPORT*  Clinical Data: Shortness of breath  PORTABLE CHEST - 1 VIEW  Comparison: 03/26/2013  Findings:  Heart size upper normal to mildly enlarged with central vascular congestion.  Interstitial prominence and perihilar increased markings.  Left lung consolidation.  Layering pleural effusions not excluded.  Multilevel degenerative change.  No pneumothorax.  Left sided large bore catheter with tips that project over the cavoatrial junction.  No interval osseous change.  IMPRESSION: Central vascular congestion and pulmonary edema pattern.  Left lung base opacity; atelectasis versus infiltrate.   Original Report Authenticated By: Carlos Levering, M.D.     ASSESSMENT AND PLAN:     Mixed hyperlipidemia   CAD   CKD (chronic kidney disease) stage V requiring chronic dialysis   Acute respiratory failure with hypoxia   Elevation of cardiac enzymes   Diabetes mellitus   Volume overload    Acute on chronic systolic CHF (congestive heart failure)     The main therapy at this time will be dialysis to stabilize his volume status.    Cardiomyopathy, ischemic   Dola Argyle 06/10/2013 10:41 AM

## 2013-06-10 NOTE — Progress Notes (Signed)
[facility] SHORT STAY H&P  CC:  Catheter removal   HPI:  This is a 67 y.o. male here for diatek catheter removal.  He was admitted to [facility] secondary to SOB 06/08/2013.  He denise chest pain changes in vision, speech.  No fever or chills.  He is currently on dialysis M-W-F.  Past medical history includes: ESRD, DM, and hypertension.  He takes BB, statin and insulin for treatment.  He has a working left upper arm graft for dialysis access.  We have been asked to remove his left IJ catheter today.   Past Medical History  Diagnosis Date  . Abnormal electrocardiogram   . Hyperlipidemia   . Retinopathy   . Gout   . ESRD (end stage renal disease)     Started HD in [place] in 2009, ESRD was due to DM. Moved to [facility] in Dec 2009 and now gets dialysis at [facility] on a MWF schedule.     . Type II or unspecified type diabetes mellitus without mention of complication, not stated as uncontrolled     adult onset  . Erectile dysfunction     penile implant  . Abnormal stress test     s/p cath November 2013 with modest disease involving the ostial left main, proximal LAD, proximal RCA - do not appear to be hemodynamically signficant; mild LV dysfunction  . Obesity   . Shortness of breath     with exertion  . Hypertension   . Hepatitis C   . COPD (chronic obstructive pulmonary disease)   . Coronary artery disease     per cath report 2013    FH:  Non-Contributory  History   Social History  . Marital Status: Married    Spouse Name: N/A    Number of Children: N/A  . Years of Education: N/A   Occupational History  . 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 Smoking status: Former Smoker -- 7 years    Quit date: 06/10/1973  . Smokeless tobacco: Never Used  . Alcohol Use: No  . Drug Use: No  . Sexual Activity: No   Other Topics Concern  . Not on file   Social History Narrative   Adopted mentally retarded child at home, 2 adopted children, 3  living and 2 deceased.    [name]  Current Facility-Administered Medications  Medication Dose Route Frequency Provider Last Rate Last Dose  . acetaminophen (TYLENOL) tablet 650 mg  650 mg Oral Q6H PRN [name], MD      . albuterol (PROVENTIL HFA;VENTOLIN HFA) 108 (90 BASE) MCG/ACT inhaler 2 puff  2 puff Inhalation Q6H PRN [name], MD      . allopurinol (ZYLOPRIM) tablet 100 mg  100 mg Oral Daily [name], MD   100 mg at 06/10/13 1235  . amitriptyline (ELAVIL) tablet 25 mg  25 mg Oral QHS [name], MD   25 mg at 06/09/13 2225  . aspirin tablet 325 mg  325 mg Oral Daily [name], MD   325 mg at 06/10/13 1235  . calcium acetate (PHOSLO) capsule 667 mg  667 mg Oral TID WC [name], [name]   667 mg at 06/10/13 1235  . cinacalcet (SENSIPAR) tablet 60 mg  60 mg Oral Q breakfast [name], MD   60 mg at 06/09/13 0900  . clopidogrel (PLAVIX) tablet 75 mg  75 mg Oral QAC breakfast [name],  MD   75 mg at 06/09/13 0900  . colchicine tablet 0.6 mg  0.6 mg Oral Daily PRN [name], MD      . dextrose 50 % solution 25 mL  25 mL Intravenous Once [name], MD      . doxercalciferol (HECTOROL) injection 2 mcg  2 mcg Intravenous Q M,W,F-HD [name], [name]   2 mcg at 06/10/13 [id number]  . heparin injection 5,000 Units  5,000 Units Subcutaneous Q8H [name], MD   5,000 Units at 06/09/13 2226  . insulin aspart (novoLOG) injection 10 Units  10 Units Intravenous Once [name], MD      . insulin aspart (novoLOG) injection 15 Units  15 Units Subcutaneous TID WC [name], MD   15 Units at 06/09/13 1245  . insulin NPH (HUMULIN N,NOVOLIN N) injection 20 Units  20 Units Subcutaneous BID AC & HS [name], MD   20 Units at 06/09/13 2225  . [START ON 06/11/2013] losartan (COZAAR) tablet 25 mg  25 mg Oral Daily [name], MD      . multivitamin (RENA-VIT) tablet 1 tablet  1 tablet Oral Daily [name], MD   1 tablet at  06/09/13 0931  . ondansetron (ZOFRAN) injection 4 mg  4 mg Intravenous Q6H PRN [name], MD      . sevelamer carbonate (RENVELA) tablet 2,400 mg  2,400 mg Oral TID WC [name], MD   2,400 mg at 06/10/13 1235  . simvastatin (ZOCOR) tablet 20 mg  20 mg Oral QHS [name], MD   20 mg at 06/09/13 2225    ROS:  See HPI  PHYSICAL EXAM  Filed Vitals:   06/10/13 1322  BP: 110/70  Pulse:   Temp:   Resp:     Gen:  Well developed well nourished HEENT:  normocephalic Neck:  Supple palpable left IJ Heart:  RRR Lungs:  Non-labored Abdomen:  soft Extremities:  Hands are cool to touch palpable radial pulses bilaterally in tact Skin:  No obvious rashes Neuro:  A&O  Lab/X-ray:  Impression: This is a 67 y.o. male here for diatek catheter removal  Plan:  Removal of left diatek catheter  [name] [facility] [name] [facility] [phone] 06/10/2013 1:46 PM  [facility] Catheter Removal Procedure Note  Diagnosis: ESRD with Functioning AVF/AVGG  Plan:  Remove left diatek catheter  Consent signed:  yes Time out completed:  yes Coumadin:  no PT/INR (if applicable):   Other labs:   Procedure: 1.  Sterile prepping and draping over catheter area 2. 0 ml 2% lidocaine plain instilled at removal site. 3.  left catheter removed in its entirety with cuff in tact. 4.  Complications: none  5. Tip of catheter sent for culture:  no   Patient tolerated procedure well:  yes Pressure held, no bleeding noted, dressing applied Instructions given to the pt regarding wound care and bleeding.  OtherLaurence Slate [facility] 06/10/2013 1:46 PM

## 2013-06-10 NOTE — Progress Notes (Signed)
ANTIBIOTIC CONSULT NOTE - INITIAL  Pharmacy Consult for Vancomycin Indication: CoNS bacteremia, infected HD cath (out today)  [name]  Patient Measurements: Height: 5\' 9"  (175.3 cm) Weight: 246 lb 14.6 oz (112 kg) IBW/kg (Calculated) : 70.7  Vital Signs: Temp: 98 F (36.7 C) (08/27 1306) Temp src: Oral (08/27 1306) BP: 110/70 mmHg (08/27 1322) Pulse Rate: 104 (08/27 1306) Intake/Output from previous day: 08/26 0701 - 08/27 0700 In: 480 [P.O.:480] Out: 3 [Stool:3] Intake/Output from this shift: Total I/O In: 360 [P.O.:360] Out: 4106 [Other:4106]  Labs:  Recent Labs  06/08/13 0240  06/08/13 0438 06/09/13 0340 06/09/13 0812 06/10/13 0500  WBC 13.0*  --   --  9.8  --   --   HGB 11.5*  --  12.6* 11.3*  --   --   PLT 259  --   --  245  --   --   CREATININE  --   < > 10.90* 7.77* 8.45* 10.61*  < > = values in this interval not displayed. Estimated Creatinine Clearance: 8.7 ml/min (by C-G formula based on Cr of 10.61). No results found for this basename: VANCOTROUGH, Corlis Leak, VANCORANDOM, [place], GENTPEAK, GENTRANDOM, TOBRATROUGH, TOBRAPEAK, TOBRARND, AMIKACINPEAK, AMIKACINTROU, AMIKACIN,  in the last 72 hours   Microbiology: Recent Results (from the past 720 hour(s))  CULTURE, BLOOD (ROUTINE X 2)     Status: None   Collection Time    06/08/13  3:45 AM      Result Value Range Status   Specimen Description BLOOD RIGHT HAND   Final   Special Requests BOTTLES DRAWN AEROBIC AND ANAEROBIC 5CC EACH   Final   Culture  Setup Time     Final   Value: 06/08/2013 10:57     Performed at [facility]   Culture     Final   Value: STAPHYLOCOCCUS SPECIES (COAGULASE NEGATIVE)     Note: RIFAMPIN AND GENTAMICIN SHOULD NOT BE USED AS SINGLE DRUGS FOR TREATMENT OF STAPH INFECTIONS.     Note: Gram Stain Report Called to,Read Back By and Verified With: KRISTAL R @ 0825 06/09/13 BY KRAWS     Performed at [facility]   Report Status PENDING   Incomplete   CULTURE, BLOOD (ROUTINE X 2)     Status: None   Collection Time    06/08/13  4:30 AM      Result Value Range Status   Specimen Description BLOOD RIGHT HAND   Final   Special Requests BOTTLES DRAWN AEROBIC ONLY 8CC   Final   Culture  Setup Time     Final   Value: 06/08/2013 10:57     Performed at [facility]   Culture     Final   Value: GRAM POSITIVE COCCI IN CLUSTERS     STAPHYLOCOCCUS SPECIES (COAGULASE NEGATIVE)     Note: Gram Stain Report Called to,Read Back By and Verified With: KRISTAL R @ 1520 06/09/13 BY KRAWS     Performed at [facility]   Report Status PENDING   Incomplete  MRSA PCR SCREENING     Status: None   Collection Time    06/08/13  8:16 AM      Result Value Range Status   MRSA by PCR NEGATIVE  NEGATIVE Final   Comment:            The GeneXpert MRSA Assay (FDA     approved for NASAL specimens     only), is one component of a  comprehensive MRSA colonization     surveillance program. It is not     intended to diagnose MRSA     infection nor to guide or     monitor treatment for     MRSA infections.    Medical History: Past Medical History  Diagnosis Date  . Abnormal electrocardiogram   . Hyperlipidemia   . Retinopathy   . Gout   . ESRD (end stage renal disease)     Started HD in [place] in 2009, ESRD was due to DM. Moved to [facility] in Dec 2009 and now gets dialysis at [facility] on a MWF schedule.     . Type II or unspecified type diabetes mellitus without mention of complication, not stated as uncontrolled     adult onset  . Erectile dysfunction     penile implant  . Abnormal stress test     s/p cath November 2013 with modest disease involving the ostial left main, proximal LAD, proximal RCA - do not appear to be hemodynamically signficant; mild LV dysfunction  . Obesity   . Shortness of breath     with exertion  . Hypertension   . Hepatitis C   . COPD (chronic obstructive pulmonary disease)   . Coronary artery disease     per  cath report 2013    Assessment: 67 y/o M with ESRD on HD MWF. To start vancomycin per pharmacy for 2/2 CoNS in Blood Cx. Appears to be tolerating full sessions of HD. WBC 9.8, Tmax 98.5.   Goal of Therapy:  Pre-HD vancomycin level 15-25 mg/L  Plan:  -Vancomycin 2500 mg x 1  -Vancomycin 1000 mg qHD MWF--f/u HD schedule -Trend WBC, temp -F/U CoNS sensitivities   Thank you for allowing me to take part in this patient's care,  Narda Bonds, PharmD Clinical Pharmacist Phone: [phone] Pager: [phone] 06/10/2013 3:23 PM

## 2013-06-11 ENCOUNTER — Inpatient Hospital Stay (HOSPITAL_COMMUNITY): Payer: Medicare Other

## 2013-06-11 ENCOUNTER — Encounter (HOSPITAL_COMMUNITY): Payer: Self-pay

## 2013-06-11 ENCOUNTER — Encounter (HOSPITAL_COMMUNITY)
Admission: EM | Disposition: A | Discharge status: Home / Self Care | Payer: Self-pay | Source: Home / Self Care | Attending: Internal Medicine

## 2013-06-11 DIAGNOSIS — B957 Other staphylococcus as the cause of diseases classified elsewhere: Secondary | ICD-10-CM

## 2013-06-11 DIAGNOSIS — I509 Heart failure, unspecified: Secondary | ICD-10-CM

## 2013-06-11 DIAGNOSIS — R7881 Bacteremia: Secondary | ICD-10-CM | POA: Diagnosis not present

## 2013-06-11 DIAGNOSIS — J90 Pleural effusion, not elsewhere classified: Secondary | ICD-10-CM | POA: Diagnosis not present

## 2013-06-11 DIAGNOSIS — R0602 Shortness of breath: Secondary | ICD-10-CM | POA: Diagnosis not present

## 2013-06-11 DIAGNOSIS — E162 Hypoglycemia, unspecified: Secondary | ICD-10-CM | POA: Diagnosis not present

## 2013-06-11 DIAGNOSIS — N186 End stage renal disease: Secondary | ICD-10-CM | POA: Diagnosis not present

## 2013-06-11 DIAGNOSIS — J811 Chronic pulmonary edema: Secondary | ICD-10-CM | POA: Diagnosis not present

## 2013-06-11 DIAGNOSIS — I2589 Other forms of chronic ischemic heart disease: Secondary | ICD-10-CM

## 2013-06-11 DIAGNOSIS — I5023 Acute on chronic systolic (congestive) heart failure: Secondary | ICD-10-CM

## 2013-06-11 DIAGNOSIS — Z113 Encounter for screening for infections with a predominantly sexual mode of transmission: Secondary | ICD-10-CM | POA: Diagnosis not present

## 2013-06-11 DIAGNOSIS — I38 Endocarditis, valve unspecified: Secondary | ICD-10-CM | POA: Diagnosis not present

## 2013-06-11 HISTORY — PX: TEE WITHOUT CARDIOVERSION: SHX5443

## 2013-06-11 LAB — CBC
MCV: 92.3 fL (ref 78.0–100.0)
Platelets: 270 10*3/uL (ref 150–400)
RBC: 3.88 MIL/uL — ABNORMAL LOW (ref 4.22–5.81)
WBC: 9.4 10*3/uL (ref 4.0–10.5)

## 2013-06-11 LAB — CULTURE, BLOOD (ROUTINE X 2)

## 2013-06-11 LAB — BASIC METABOLIC PANEL
Calcium: 9.4 mg/dL (ref 8.4–10.5)
Creatinine, Ser: 7.81 mg/dL — ABNORMAL HIGH (ref 0.50–1.35)
GFR calc Af Amer: 7 mL/min — ABNORMAL LOW (ref >90)
GFR calc non Af Amer: 6 mL/min — ABNORMAL LOW (ref >90)
Sodium: 137 mEq/L (ref 135–145)

## 2013-06-11 LAB — GLUCOSE, CAPILLARY
Glucose-Capillary: 34 mg/dL — CL (ref 70–99)
Glucose-Capillary: 166 mg/dL — ABNORMAL HIGH (ref 70–99)

## 2013-06-11 SURGERY — ECHOCARDIOGRAM, TRANSESOPHAGEAL
Anesthesia: Moderate Sedation

## 2013-06-11 MED ORDER — LIDOCAINE HCL (PF) 1 % IJ SOLN: 5.0000 mL | INTRAMUSCULAR | Status: DC | PRN

## 2013-06-11 MED ORDER — SODIUM CHLORIDE 0.9 % IV SOLN: 100.0000 mL | INTRAVENOUS | Status: DC | PRN

## 2013-06-11 MED ORDER — NEPRO/CARBSTEADY PO LIQD: 237.0000 mL | ORAL | Status: DC | PRN

## 2013-06-11 MED ORDER — MIDAZOLAM HCL 5 MG/ML IJ SOLN
INTRAMUSCULAR | Status: AC
  Filled 2013-06-11: qty 2

## 2013-06-11 MED ORDER — SODIUM CHLORIDE 0.9 % IV SOLN: INTRAVENOUS | Status: DC

## 2013-06-11 MED ORDER — ALTEPLASE 2 MG IJ SOLR
2.0000 mg | Freq: Once | INTRAMUSCULAR | Status: AC | PRN
  Filled 2013-06-11: qty 2

## 2013-06-11 MED ORDER — DEXTROSE-NACL 5-0.9 % IV SOLN
INTRAVENOUS | Status: DC
  Administered 2013-06-11: 16:00:00 via INTRAVENOUS

## 2013-06-11 MED ORDER — HEPARIN SODIUM (PORCINE) 1000 UNIT/ML DIALYSIS
1000.0000 [IU] | INTRAMUSCULAR | Status: DC | PRN
  Filled 2013-06-11: qty 1

## 2013-06-11 MED ORDER — FENTANYL CITRATE 0.05 MG/ML IJ SOLN
INTRAMUSCULAR | Status: AC
  Filled 2013-06-11: qty 4

## 2013-06-11 MED ORDER — BUTAMBEN-TETRACAINE-BENZOCAINE 2-2-14 % EX AERO
INHALATION_SPRAY | CUTANEOUS | Status: DC | PRN
  Administered 2013-06-11: 2 via TOPICAL

## 2013-06-11 MED ORDER — MIDAZOLAM HCL 10 MG/2ML IJ SOLN
INTRAMUSCULAR | Status: DC | PRN
  Administered 2013-06-11 (×2): 2 mg via INTRAVENOUS

## 2013-06-11 MED ORDER — DEXTROSE 50 % IV SOLN
INTRAVENOUS | Status: AC
  Administered 2013-06-11: 25 mL via INTRAVENOUS
  Filled 2013-06-11: qty 50

## 2013-06-11 MED ORDER — FENTANYL CITRATE 0.05 MG/ML IJ SOLN
INTRAMUSCULAR | Status: DC | PRN
  Administered 2013-06-11 (×2): 25 ug via INTRAVENOUS

## 2013-06-11 MED ORDER — PENTAFLUOROPROP-TETRAFLUOROETH EX AERO: 1.0000 "application " | INHALATION_SPRAY | CUTANEOUS | Status: DC | PRN

## 2013-06-11 MED ORDER — HEPARIN SODIUM (PORCINE) 1000 UNIT/ML DIALYSIS
4500.0000 [IU] | INTRAMUSCULAR | Status: DC | PRN
  Administered 2013-06-12: 4500 [IU] via INTRAVENOUS_CENTRAL
  Filled 2013-06-11: qty 5

## 2013-06-11 MED ORDER — METOPROLOL TARTRATE 12.5 MG HALF TABLET
12.5000 mg | ORAL_TABLET | Freq: Two times a day (BID) | ORAL | Status: DC
  Administered 2013-06-11: 12.5 mg via ORAL
  Filled 2013-06-11 (×4): qty 1

## 2013-06-11 MED ORDER — LIDOCAINE-PRILOCAINE 2.5-2.5 % EX CREA: 1.0000 "application " | TOPICAL_CREAM | CUTANEOUS | Status: DC | PRN

## 2013-06-11 NOTE — Progress Notes (Signed)
Physical Therapy Treatment Patient Details Name: Ernest [name] MRN: [id number] DOB: 1945-11-08 Today's Date: 06/11/2013 Time: [id number] PT Time Calculation (min): 17 min  PT Assessment / Plan / Recommendation  History of Present Illness 67 y.o. male with history of ESRD on hemodialysis MWF, moderate CAD, diabetes on insulin, hypertension presents to the ER today with the above complaint.   PT Comments   Pt able to increase ambulation distance today however still with dyspnea and required oxygen.  Follow Up Recommendations  Home health PT;Supervision/Assistance - 24 hour     Does the patient have the potential to tolerate intense rehabilitation     Barriers to Discharge        Equipment Recommendations  Rolling walker with 5" wheels    Recommendations for Other Services    Frequency     Progress towards PT Goals Progress towards PT goals: Progressing toward goals  Plan Current plan remains appropriate    Precautions / Restrictions Precautions Precautions: Fall Restrictions Weight Bearing Restrictions: No   Pertinent Vitals/Pain SATURATION QUALIFICATIONS: (This note is used to comply with regulatory documentation for home oxygen)  Patient Saturations on Room Air at Rest = 93%  Patient Saturations on Room Air while Ambulating = 82%  Patient Saturations on 2 Liters of oxygen while Ambulating = 97%  Please briefly explain why patient needs home oxygen: to maintain sats above 88% during ambulation    Mobility  Bed Mobility Bed Mobility: Supine to Sit;Sit to Supine Supine to Sit: 6: Modified independent (Device/Increase time);HOB elevated Sit to Supine: 6: Modified independent (Device/Increase time);HOB elevated Transfers Transfers: Sit to Stand;Stand to Sit Sit to Stand: With upper extremity assist;From bed;5: Supervision Stand to Sit: With upper extremity assist;5: Supervision;To chair/3-in-1 Details for Transfer Assistance: verbal cues for safety, lines,  O2 Ambulation/Gait Ambulation/Gait Assistance: 4: Min guard Ambulation Distance (Feet): 260 Feet Assistive device: None Ambulation/Gait Assistance Details: pt requires O2 when ambulating (see vitals section), able to progress distance today, pt continues to report SOB during ambulation however appears better today than yesterday Gait Pattern: Step-through pattern;Decreased stride length General Gait Details: no episodes of LOB    Exercises     PT Diagnosis:    PT Problem List:   PT Treatment Interventions:     PT Goals (current goals can now be found in the care plan section)    Visit Information  Last PT Received On: 06/11/13 Assistance Needed: +1 History of Present Illness: 67 y.o. male with history of ESRD on hemodialysis MWF, moderate CAD, diabetes on insulin, hypertension presents to the ER today with the above complaint.    Subjective Data      Cognition  Cognition Arousal/Alertness: Awake/alert Behavior During Therapy: WFL for tasks assessed/performed Overall Cognitive Status: Within Functional Limits for tasks assessed    Balance     End of Session PT - End of Session Equipment Utilized During Treatment: Gait belt;Oxygen Activity Tolerance: Patient limited by fatigue Patient left: with call bell/phone within reach;in chair   GP     Feliciana Narayan,KATHrine E 06/11/2013, 9:54 AM [name], PT, DPT 06/11/2013 Pager: [phone]

## 2013-06-11 NOTE — Interval H&P Note (Signed)
History and Physical Interval Note:  06/11/2013 4:44 PM  Ernest [name]  has presented today for surgery, with the diagnosis of stroke  The various methods of treatment have been discussed with the patient and family. After consideration of risks, benefits and other options for treatment, the patient has consented to  Procedure(s): TRANSESOPHAGEAL ECHOCARDIOGRAM (TEE) (N/A) as a surgical intervention .  The patient's history has been reviewed, patient examined, no change in status, stable for surgery.  I have reviewed the patient's chart and labs.  Questions were answered to the patient's satisfaction.     Nikayla Madaris [employer]

## 2013-06-11 NOTE — Progress Notes (Signed)
  Echocardiogram Echocardiogram Transesophageal has been performed.  Ernest [name] 06/11/2013, 5:25 PM

## 2013-06-11 NOTE — Consult Note (Addendum)
[facility] for Infectious [facility]    Date of Admission:  06/08/2013  Date of Consult:  06/11/2013  Reason for Consult: Coag  negative staphylococcal bacteremia Referring Physician: Dr. Candiss Norse   HPI: Ernest [name] is an 67 y.o. male with ESRD, HD recently via LUE AV graft placed on 05/05/13, but with hx of IJ catheter (removed on [address]. He was admitted to [facility] wit SOB, and respiratory failure but had been and fevers on hemodialysis. Blood cultures were drawn at admission and these have both grown 2 out of 2 coag negative staphylococcus> Vancomycin was started yesterday and IJ cath is dc'd as well. I have asked [facility] to obtain TEE to investigate for endocarditis.   Past Medical History  Diagnosis Date  . Abnormal electrocardiogram   . Hyperlipidemia   . Retinopathy   . Gout   . ESRD (end stage renal disease)     Started HD in [place] in 2009, ESRD was due to DM. Moved to [facility] in Dec 2009 and now gets dialysis at [facility] on a MWF schedule.     . Type II or unspecified type diabetes mellitus without mention of complication, not stated as uncontrolled     adult onset  . Erectile dysfunction     penile implant  . Abnormal stress test     s/p cath November 2013 with modest disease involving the ostial left main, proximal LAD, proximal RCA - do not appear to be hemodynamically signficant; mild LV dysfunction  . Obesity   . Shortness of breath     with exertion  . Hypertension   . Hepatitis C   . COPD (chronic obstructive pulmonary disease)   . Coronary artery disease     per cath report 2013    Past Surgical History  Procedure Laterality Date  . Penile prosthesis implant    . Fistula      RUE and wrist  . Cardiac catheterization  August 26, 2012  . Eye surgery      laser.  and surgery for DM  . Insertion of dialysis catheter Right 01/01/2013    Procedure: INSERTION OF DIALYSIS CATHETER right  internal jugular;  Surgeon: Serafina Mitchell, MD;  Location: [place];   Service: Vascular;  Laterality: Right;  . Av fistula placement Left 01/13/2013    Procedure: INSERTION OF ARTERIOVENOUS (AV) GORE-TEX GRAFT ARM;  Surgeon: Angelia Mould, MD;  Location: [place];  Service: Vascular;  Laterality: Left;  . Thrombectomy w/ embolectomy Left 03/26/2013    Procedure: Attempted thrombectomy of left arm arteriovenous goretex graft.;  Surgeon: Angelia Mould, MD;  Location: [place];  Service: Vascular;  Laterality: Left;  . Insertion of dialysis catheter N/A 03/26/2013    Procedure: INSERTION OF DIALYSIS CATHETER Left internal jugular vein;  Surgeon: Angelia Mould, MD;  Location: [place];  Service: Vascular;  Laterality: N/A;  . Avgg removal Left 03/26/2013    Procedure: REMOVAL OF  NON INCORPORATED ARTERIOVENOUS GORETEX GRAFT ([place]) left arm * repair of  left brachial artery with vein patch angioplasty.;  Surgeon: Angelia Mould, MD;  Location: [place];  Service: Vascular;  Laterality: Left;  . Av fistula placement Left 05/05/2013    Procedure: INSERTION OF LEFT UPPER ARM  ARTERIOVENOUS GORTEX GRAFT;  Surgeon: Angelia Mould, MD;  Location: [facility];  Service: Vascular;  Laterality: Left;  ergies:   [name]   Medications: I have reviewed patients current medications as documented in  Epic Anti-infectives   Start     Dose/Rate Route Frequency Ordered Stop   06/12/13 1200  [MAR Hold]  vancomycin (VANCOCIN) IVPB 1000 mg/200 mL premix     (On MAR Hold since 06/11/13 1518)   1,000 mg 200 mL/hr over 60 Minutes Intravenous Every M-W-F (Hemodialysis) 06/10/13 1517     06/10/13 1600  vancomycin (VANCOCIN) 2,500 mg in sodium chloride 0.9 % 500 mL IVPB     2,500 mg 250 mL/hr over 120 Minutes Intravenous  Once 06/10/13 1517 06/10/13 1840   06/08/13 0315  levofloxacin (LEVAQUIN) IVPB 750 mg     750 mg 100 mL/hr over 90 Minutes Intravenous  Once 06/08/13 0301 06/08/13 0556      Social History:  reports that he quit smoking about 40 years ago. He  has never used smokeless tobacco. He reports that he does not drink alcohol or use illicit drugs.  Family History  Problem Relation Age of Onset  . Heart disease Father     As in HPI and primary teams notes otherwise 12 point review of systems is negative  Blood pressure 154/58, pulse 97, temperature 97.5 F (36.4 C), temperature source Oral, resp. rate 13, height 5\' 9"  (1.753 m), weight 250 lb 14.1 oz (113.8 kg), SpO2 91.00%. General: Alert and awake, oriented x3, not in any acute distress. Morbidly obese HEENT: anicteric sclera, pupils reactive to light and accommodation, EOMI, oropharynx clear and without exudate CVS regular rate, normal r,  no murmur rubs or gallops Chest: clear to auscultation bilaterally, no wheezing, rales or rhonchi Abdomen: soft nontender, nondistended, normal bowel sounds,  Skin: LUE graft site is clean and without purulence. His IJ site is bandaged Neuro: nonfocal, strength and sensation intact   Results for orders placed during the hospital encounter of 06/08/13 (from the past 48 hour(s))  GLUCOSE, CAPILLARY     Status: None   Collection Time    06/09/13  5:01 PM      Result Value Range   Glucose-Capillary 93  70 - 99 mg/dL   Comment 1 Notify RN    GLUCOSE, CAPILLARY     Status: Abnormal   Collection Time    06/09/13  9:34 PM      Result Value Range   Glucose-Capillary 118 (*) 70 - 99 mg/dL   Comment 1 Documented in Chart     Comment 2 Notify RN    RENAL FUNCTION PANEL     Status: Abnormal   Collection Time    06/10/13  5:00 AM      Result Value Range   Sodium 134 (*) 135 - 145 mEq/L   Potassium 5.3 (*) 3.5 - 5.1 mEq/L   Chloride 94 (*) 96 - 112 mEq/L   CO2 26  19 - 32 mEq/L   Glucose, Bld 118 (*) 70 - 99 mg/dL   BUN 50 (*) 6 - 23 mg/dL   Creatinine, Ser 10.61 (*) 0.50 - 1.35 mg/dL   Calcium 9.2  8.4 - 10.5 mg/dL   Phosphorus 6.4 (*) 2.3 - 4.6 mg/dL   Albumin 2.9 (*) 3.5 - 5.2 g/dL   GFR calc non Af Amer 4 (*) >90 mL/min   GFR calc Af  Amer 5 (*) >90 mL/min   Comment: (NOTE)     The eGFR has been calculated using the CKD EPI equation.     This calculation has not been validated in all clinical situations.     eGFR's persistently <90 mL/min signify possible Chronic Kidney  Disease.  GLUCOSE, CAPILLARY     Status: None   Collection Time    06/10/13 12:48 PM      Result Value Range   Glucose-Capillary 89  70 - 99 mg/dL  HEMOGLOBIN A1C     Status: Abnormal   Collection Time    06/10/13  2:35 PM      Result Value Range   Hemoglobin A1C 6.6 (*) <5.7 %   Comment: (NOTE)                                                                               According to the ADA Clinical Practice Recommendations for 2011, when     HbA1c is used as a screening test:      >=6.5%   Diagnostic of Diabetes Mellitus               (if abnormal result is confirmed)     5.7-6.4%   Increased risk of developing Diabetes Mellitus     References:Diagnosis and Classification of Diabetes Mellitus,Diabetes     [id number] 1):S62-S69 and Standards of Medical Care in             Diabetes - 2011,Diabetes Care,2011,34 (Suppl 1):S11-S61.   Mean Plasma Glucose 143 (*) <117 mg/dL   Comment: Performed at [facility], CAPILLARY     Status: Abnormal   Collection Time    06/10/13  4:35 PM      Result Value Range   Glucose-Capillary 223 (*) 70 - 99 mg/dL   Comment 1 Notify RN    GLUCOSE, CAPILLARY     Status: None   Collection Time    06/10/13  9:47 PM      Result Value Range   Glucose-Capillary 96  70 - 99 mg/dL  CBC     Status: Abnormal   Collection Time    06/11/13  7:20 AM      Result Value Range   WBC 9.4  4.0 - 10.5 K/uL   RBC 3.88 (*) 4.22 - 5.81 MIL/uL   Hemoglobin 11.9 (*) 13.0 - 17.0 g/dL   HCT 35.8 (*) 39.0 - 52.0 %   MCV 92.3  78.0 - 100.0 fL   MCH 30.7  26.0 - 34.0 pg   MCHC 33.2  30.0 - 36.0 g/dL   RDW 14.2  11.5 - 15.5 %   Platelets 270  150 - 400 K/uL  BASIC METABOLIC PANEL     Status: Abnormal    Collection Time    06/11/13  7:20 AM      Result Value Range   Sodium 137  135 - 145 mEq/L   Potassium 5.4 (*) 3.5 - 5.1 mEq/L   Chloride 99  96 - 112 mEq/L   CO2 27  19 - 32 mEq/L   Glucose, Bld 93  70 - 99 mg/dL   BUN 29 (*) 6 - 23 mg/dL   Creatinine, Ser 7.81 (*) 0.50 - 1.35 mg/dL   Calcium 9.4  8.4 - 10.5 mg/dL   GFR calc non Af Amer 6 (*) >90 mL/min   GFR calc Af Amer 7 (*) >90 mL/min   Comment: (NOTE)  The eGFR has been calculated using the CKD EPI equation.     This calculation has not been validated in all clinical situations.     eGFR's persistently <90 mL/min signify possible Chronic Kidney     Disease.  GLUCOSE, CAPILLARY     Status: Abnormal   Collection Time    06/11/13  2:30 PM      Result Value Range   Glucose-Capillary 34 (*) 70 - 99 mg/dL   Comment 1 Notify RN    GLUCOSE, CAPILLARY     Status: Abnormal   Collection Time    06/11/13  2:51 PM      Result Value Range   Glucose-Capillary 143 (*) 70 - 99 mg/dL  GLUCOSE, CAPILLARY     Status: None   Collection Time    06/11/13  3:24 PM      Result Value Range   Glucose-Capillary 93  70 - 99 mg/dL      Component Value Date/Time   SDES BLOOD RIGHT HAND 06/08/2013 0430   SPECREQUEST BOTTLES DRAWN AEROBIC ONLY 8CC 06/08/2013 0430   CULT  Value: GRAM POSITIVE COCCI IN CLUSTERS Note: SUSCEPTIBILITIES PERFORMED ON PREVIOUS CULTURE WITHIN THE LAST 5 DAYS. STAPHYLOCOCCUS SPECIES (COAGULASE NEGATIVE) Note: Gram Stain Report Called to,Read Back By and Verified With: KRISTAL R @ 1520 06/09/13 BY KRAWS Performed at [facility] 06/08/2013 0430   REPTSTATUS 06/11/2013 FINAL 06/08/2013 0430   Dg Chest 2 View  06/11/2013   *RADIOLOGY REPORT*  Clinical Data: Shortness of breath.  Follow up of pulmonary edema.  CHEST - 2 VIEW  Comparison: 06/09/13.  Findings: Removal of dialysis catheter. Cardiomegaly accentuated by AP portable technique.  A small left pleural effusion is slightly decreased. Cannot exclude minimal loculation  laterally.  No pneumothorax.  Interstitial edema is decreased and mild.  Improved bibasilar airspace disease.  Increased density projecting over the left side of the thoracic inlet is indeterminate. Possibly related to degenerative change in costovertebral articulations.  Similar back to 03/01/2013.  IMPRESSION: Improved aeration with decreased left base air space disease and adjacent small left pleural effusion.  Improved congestive heart failure.   Original Report Authenticated By: Abigail Miyamoto, M.D.     Recent Results (from the past 720 hour(s))  CULTURE, BLOOD (ROUTINE X 2)     Status: None   Collection Time    06/08/13  3:45 AM      Result Value Range Status   Specimen Description BLOOD RIGHT HAND   Final   Special Requests BOTTLES DRAWN AEROBIC AND ANAEROBIC [facility] EACH   Final   Culture  Setup Time     Final   Value: 06/08/2013 10:57     Performed at [facility]   Culture     Final   Value: STAPHYLOCOCCUS SPECIES (COAGULASE NEGATIVE)     Note: RIFAMPIN AND GENTAMICIN SHOULD NOT BE USED AS SINGLE DRUGS FOR TREATMENT OF STAPH INFECTIONS. This organism DOES NOT demonstrate inducible Clindamycin resistance in vitro.     Note: Gram Stain Report Called to,Read Back By and Verified With: KRISTAL R @ 0825 06/09/13 BY KRAWS     Performed at [facility]   Report Status 06/11/2013 FINAL   Final   Organism ID, Bacteria STAPHYLOCOCCUS SPECIES (COAGULASE NEGATIVE)   Final  CULTURE, BLOOD (ROUTINE X 2)     Status: None   Collection Time    06/08/13  4:30 AM      Result Value Range Status   Specimen Description  BLOOD RIGHT HAND   Final   Special Requests BOTTLES DRAWN AEROBIC ONLY 8CC   Final   Culture  Setup Time     Final   Value: 06/08/2013 10:57     Performed at [facility]   Culture     Final   Value: GRAM POSITIVE COCCI IN CLUSTERS     Note: SUSCEPTIBILITIES PERFORMED ON PREVIOUS CULTURE WITHIN THE LAST 5 DAYS.     STAPHYLOCOCCUS SPECIES (COAGULASE NEGATIVE)      Note: Gram Stain Report Called to,Read Back By and Verified With: KRISTAL R @ 1520 06/09/13 BY KRAWS     Performed at [facility]   Report Status 06/11/2013 FINAL   Final  MRSA PCR SCREENING     Status: None   Collection Time    06/08/13  8:16 AM      Result Value Range Status   MRSA by PCR NEGATIVE  NEGATIVE Final   Comment:            The GeneXpert MRSA Assay (FDA     approved for NASAL specimens     only), is one component of a     comprehensive MRSA colonization     surveillance program. It is not     intended to diagnose MRSA     infection nor to guide or     monitor treatment for     MRSA infections.     Impression/Recommendation   67 year old with ESRD on HD via HD catheter but more recently with LUE AV graft, now admitted with Coag Neg Staph bacteremia, sp removal of IJ line  #1 coag negative staph bacteremia: Coag Negative staph can be esp virulent in particular Staphylococcus lugdunensis and can have predeliction for heart valves and be esp destructive  --obtain TEE --lines are out --repeat blood cultures post removal of lines --narrowed to vancomycin --he needs MINIMUM of 2 weeks of IV vancomycin IF the TEE excludes endocarditis and even in that case I might prefer to actually push  For 4 weeks since he has GRAFT that while NOT overtly infected could be nidus  --will consider + rifampin given graft presence once we have negative cultures post HD catheter removal  --please make sure [facility] has carefully examiend the LUE graft site  #2 Screening: check HIV and hep C  Dr. Johnnye Sima takes over service tomorrow.  Thank you so much for this interesting consult  [place] [facility] [place] [phone] (pager) [phone] (office) 06/11/2013, 3:37 PM  Rhina [place] 06/11/2013, 3:37 PM

## 2013-06-11 NOTE — H&P (View-Only) (Signed)
[facility]    Date of Admission:  06/08/2013  Date of Consult:  06/11/2013  Reason for Consult: Coag  negative staphylococcal bacteremia Referring Physician: Dr. Candiss Norse   HPI: Ernest [name] is an 67 y.o. male with ESRD, HD recently via LUE AV graft placed on 05/05/13, but with hx of IJ catheter (removed on 8[address]14. He was admitted to [facility] wit SOB, and respiratory failure but had been and fevers on hemodialysis. Blood cultures were drawn at admission and these have both grown 2 out of 2 coag negative staphylococcus> Vancomycin was started yesterday and IJ cath is dc'd as well. I have asked [facility] to obtain TEE to investigate for endocarditis.   Past Medical History  Diagnosis Date  . Abnormal electrocardiogram   . Hyperlipidemia   . Retinopathy   . Gout   . ESRD (end stage renal disease)     Started HD in [place] in 2009, ESRD was due to DM. Moved to [facility] in Dec 2009 and now gets dialysis at [facility] on a MWF schedule.     . Type II or unspecified type diabetes mellitus without mention of complication, not stated as uncontrolled     adult onset  . Erectile dysfunction     penile implant  . Abnormal stress test     s/p cath November 2013 with modest disease involving the ostial left main, proximal LAD, proximal RCA - do not appear to be hemodynamically signficant; mild LV dysfunction  . Obesity   . Shortness of breath     with exertion  . Hypertension   . Hepatitis C   . COPD (chronic obstructive pulmonary disease)   . Coronary artery disease     per cath report 2013    Past Surgical History  Procedure Laterality Date  . Penile prosthesis implant    . Fistula      RUE and wrist  . Cardiac catheterization  August 26, 2012  . Eye surgery      laser.  and surgery for DM  . Insertion of dialysis catheter Right 01/01/2013    Procedure: INSERTION OF DIALYSIS CATHETER right  internal jugular;  Surgeon: Serafina Mitchell, MD;  Location: [facility];   Service: Vascular;  Laterality: Right;  . Av fistula placement Left 01/13/2013    Procedure: INSERTION OF ARTERIOVENOUS (AV) GORE-TEX GRAFT ARM;  Surgeon: Angelia Mould, MD;  Location: [place];  Service: Vascular;  Laterality: Left;  . Thrombectomy w/ embolectomy Left 03/26/2013    Procedure: Attempted thrombectomy of left arm arteriovenous goretex graft.;  Surgeon: Angelia Mould, MD;  Location: [place];  Service: Vascular;  Laterality: Left;  . Insertion of dialysis catheter N/A 03/26/2013    Procedure: INSERTION OF DIALYSIS CATHETER Left internal jugular vein;  Surgeon: Angelia Mould, MD;  Location: [place];  Service: Vascular;  Laterality: N/A;  . Avgg removal Left 03/26/2013    Procedure: REMOVAL OF  NON INCORPORATED ARTERIOVENOUS GORETEX GRAFT (Sarah Ann) left arm * repair of  left brachial artery with vein patch angioplasty.;  Surgeon: Angelia Mould, MD;  Location: [place];  Service: Vascular;  Laterality: Left;  . Av fistula placement Left 05/05/2013    Procedure: INSERTION OF LEFT UPPER ARM  ARTERIOVENOUS GORTEX GRAFT;  Surgeon: Angelia Mould, MD;  Location: [facility];  Service: Vascular;  Laterality: Left;  ergies:   [name]   Medications: I have reviewed patients current medications as documented in  Epic Anti-infectives   Start     Dose/Rate Route Frequency Ordered Stop   06/12/13 1200  [MAR Hold]  vancomycin (VANCOCIN) IVPB 1000 mg/200 mL premix     (On MAR Hold since 06/11/13 1518)   1,000 mg 200 mL/hr over 60 Minutes Intravenous Every M-W-F (Hemodialysis) 06/10/13 1517     06/10/13 1600  vancomycin (VANCOCIN) 2,500 mg in sodium chloride 0.9 % 500 mL IVPB     2,500 mg 250 mL/hr over 120 Minutes Intravenous  Once 06/10/13 1517 06/10/13 1840   06/08/13 0315  levofloxacin (LEVAQUIN) IVPB 750 mg     750 mg 100 mL/hr over 90 Minutes Intravenous  Once 06/08/13 0301 06/08/13 0556      Social History:  reports that he quit smoking about 40 years ago. He  has never used smokeless tobacco. He reports that he does not drink alcohol or use illicit drugs.  Family History  Problem Relation Age of Onset  . Heart disease Father     As in HPI and primary teams notes otherwise 12 point review of systems is negative  Blood pressure 154/58, pulse 97, temperature 97.5 F (36.4 C), temperature source Oral, resp. rate 13, height 5\' 9"  (1.753 m), weight 250 lb 14.1 oz (113.8 kg), SpO2 91.00%. General: Alert and awake, oriented x3, not in any acute distress. Morbidly obese HEENT: anicteric sclera, pupils reactive to light and accommodation, EOMI, oropharynx clear and without exudate CVS regular rate, normal r,  no murmur rubs or gallops Chest: clear to auscultation bilaterally, no wheezing, rales or rhonchi Abdomen: soft nontender, nondistended, normal bowel sounds,  Skin: LUE graft site is clean and without purulence. His IJ site is bandaged Neuro: nonfocal, strength and sensation intact   Results for orders placed during the hospital encounter of 06/08/13 (from the past 48 hour(s))  GLUCOSE, CAPILLARY     Status: None   Collection Time    06/09/13  5:01 PM      Result Value Range   Glucose-Capillary 93  70 - 99 mg/dL   Comment 1 Notify RN    GLUCOSE, CAPILLARY     Status: Abnormal   Collection Time    06/09/13  9:34 PM      Result Value Range   Glucose-Capillary 118 (*) 70 - 99 mg/dL   Comment 1 Documented in Chart     Comment 2 Notify RN    RENAL FUNCTION PANEL     Status: Abnormal   Collection Time    06/10/13  5:00 AM      Result Value Range   Sodium 134 (*) 135 - 145 mEq/L   Potassium 5.3 (*) 3.5 - 5.1 mEq/L   Chloride 94 (*) 96 - 112 mEq/L   CO2 26  19 - 32 mEq/L   Glucose, Bld 118 (*) 70 - 99 mg/dL   BUN 50 (*) 6 - 23 mg/dL   Creatinine, Ser 10.61 (*) 0.50 - 1.35 mg/dL   Calcium 9.2  8.4 - 10.5 mg/dL   Phosphorus 6.4 (*) 2.3 - 4.6 mg/dL   Albumin 2.9 (*) 3.5 - 5.2 g/dL   GFR calc non Af Amer 4 (*) >90 mL/min   GFR calc Af  Amer 5 (*) >90 mL/min   Comment: (NOTE)     The eGFR has been calculated using the CKD EPI equation.     This calculation has not been validated in all clinical situations.     eGFR's persistently <90 mL/min signify possible Chronic Kidney  Disease.  GLUCOSE, CAPILLARY     Status: None   Collection Time    06/10/13 12:48 PM      Result Value Range   Glucose-Capillary 89  70 - 99 mg/dL  HEMOGLOBIN A1C     Status: Abnormal   Collection Time    06/10/13  2:35 PM      Result Value Range   Hemoglobin A1C 6.6 (*) <5.7 %   Comment: (NOTE)                                                                               According to the ADA Clinical Practice Recommendations for 2011, when     HbA1c is used as a screening test:      >=6.5%   Diagnostic of Diabetes Mellitus               (if abnormal result is confirmed)     5.7-6.4%   Increased risk of developing Diabetes Mellitus     References:Diagnosis and Classification of Diabetes Mellitus,Diabetes     [id number] 1):S62-S69 and Standards of Medical Care in             Diabetes - 2011,Diabetes Care,2011,34 (Suppl 1):S11-S61.   Mean Plasma Glucose 143 (*) <117 mg/dL   Comment: Performed at [facility], CAPILLARY     Status: Abnormal   Collection Time    06/10/13  4:35 PM      Result Value Range   Glucose-Capillary 223 (*) 70 - 99 mg/dL   Comment 1 Notify RN    GLUCOSE, CAPILLARY     Status: None   Collection Time    06/10/13  9:47 PM      Result Value Range   Glucose-Capillary 96  70 - 99 mg/dL  CBC     Status: Abnormal   Collection Time    06/11/13  7:20 AM      Result Value Range   WBC 9.4  4.0 - 10.5 K/uL   RBC 3.88 (*) 4.22 - 5.81 MIL/uL   Hemoglobin 11.9 (*) 13.0 - 17.0 g/dL   HCT 35.8 (*) 39.0 - 52.0 %   MCV 92.3  78.0 - 100.0 fL   MCH 30.7  26.0 - 34.0 pg   MCHC 33.2  30.0 - 36.0 g/dL   RDW 14.2  11.5 - 15.5 %   Platelets 270  150 - 400 K/uL  BASIC METABOLIC PANEL     Status: Abnormal    Collection Time    06/11/13  7:20 AM      Result Value Range   Sodium 137  135 - 145 mEq/L   Potassium 5.4 (*) 3.5 - 5.1 mEq/L   Chloride 99  96 - 112 mEq/L   CO2 27  19 - 32 mEq/L   Glucose, Bld 93  70 - 99 mg/dL   BUN 29 (*) 6 - 23 mg/dL   Creatinine, Ser 7.81 (*) 0.50 - 1.35 mg/dL   Calcium 9.4  8.4 - 10.5 mg/dL   GFR calc non Af Amer 6 (*) >90 mL/min   GFR calc Af Amer 7 (*) >90 mL/min   Comment: (NOTE)  The eGFR has been calculated using the CKD EPI equation.     This calculation has not been validated in all clinical situations.     eGFR's persistently <90 mL/min signify possible Chronic Kidney     Disease.  GLUCOSE, CAPILLARY     Status: Abnormal   Collection Time    06/11/13  2:30 PM      Result Value Range   Glucose-Capillary 34 (*) 70 - 99 mg/dL   Comment 1 Notify RN    GLUCOSE, CAPILLARY     Status: Abnormal   Collection Time    06/11/13  2:51 PM      Result Value Range   Glucose-Capillary 143 (*) 70 - 99 mg/dL  GLUCOSE, CAPILLARY     Status: None   Collection Time    06/11/13  3:24 PM      Result Value Range   Glucose-Capillary 93  70 - 99 mg/dL      Component Value Date/Time   SDES BLOOD RIGHT HAND 06/08/2013 0430   SPECREQUEST BOTTLES DRAWN AEROBIC ONLY 8CC 06/08/2013 0430   CULT  Value: GRAM POSITIVE COCCI IN CLUSTERS Note: SUSCEPTIBILITIES PERFORMED ON PREVIOUS CULTURE WITHIN THE LAST 5 DAYS. STAPHYLOCOCCUS SPECIES (COAGULASE NEGATIVE) Note: Gram Stain Report Called to,Read Back By and Verified With: KRISTAL R @ 1520 06/09/13 BY KRAWS Performed at [facility] 06/08/2013 0430   REPTSTATUS 06/11/2013 FINAL 06/08/2013 0430   Dg Chest 2 View  06/11/2013   *RADIOLOGY REPORT*  Clinical Data: Shortness of breath.  Follow up of pulmonary edema.  CHEST - 2 VIEW  Comparison: 06/09/13.  Findings: Removal of dialysis catheter. Cardiomegaly accentuated by AP portable technique.  A small left pleural effusion is slightly decreased. Cannot exclude minimal loculation  laterally.  No pneumothorax.  Interstitial edema is decreased and mild.  Improved bibasilar airspace disease.  Increased density projecting over the left side of the thoracic inlet is indeterminate. Possibly related to degenerative change in costovertebral articulations.  Similar back to 03/01/2013.  IMPRESSION: Improved aeration with decreased left base air space disease and adjacent small left pleural effusion.  Improved congestive heart failure.   Original Report Authenticated By: Abigail Miyamoto, M.D.     Recent Results (from the past 720 hour(s))  CULTURE, BLOOD (ROUTINE X 2)     Status: None   Collection Time    06/08/13  3:45 AM      Result Value Range Status   Specimen Description BLOOD RIGHT HAND   Final   Special Requests BOTTLES DRAWN AEROBIC AND ANAEROBIC [facility] EACH   Final   Culture  Setup Time     Final   Value: 06/08/2013 10:57     Performed at [facility]   Culture     Final   Value: STAPHYLOCOCCUS SPECIES (COAGULASE NEGATIVE)     Note: RIFAMPIN AND GENTAMICIN SHOULD NOT BE USED AS SINGLE DRUGS FOR TREATMENT OF STAPH INFECTIONS. This organism DOES NOT demonstrate inducible Clindamycin resistance in vitro.     Note: Gram Stain Report Called to,Read Back By and Verified With: KRISTAL R @ 0825 06/09/13 BY KRAWS     Performed at [facility]   Report Status 06/11/2013 FINAL   Final   Organism ID, Bacteria STAPHYLOCOCCUS SPECIES (COAGULASE NEGATIVE)   Final  CULTURE, BLOOD (ROUTINE X 2)     Status: None   Collection Time    06/08/13  4:30 AM      Result Value Range Status   Specimen Description  BLOOD RIGHT HAND   Final   Special Requests BOTTLES DRAWN AEROBIC ONLY 8CC   Final   Culture  Setup Time     Final   Value: 06/08/2013 10:57     Performed at [facility]   Culture     Final   Value: GRAM POSITIVE COCCI IN CLUSTERS     Note: SUSCEPTIBILITIES PERFORMED ON PREVIOUS CULTURE WITHIN THE LAST 5 DAYS.     STAPHYLOCOCCUS SPECIES (COAGULASE NEGATIVE)      Note: Gram Stain Report Called to,Read Back By and Verified With: KRISTAL R @ 1520 06/09/13 BY KRAWS     Performed at [facility]   Report Status 06/11/2013 FINAL   Final  MRSA PCR SCREENING     Status: None   Collection Time    06/08/13  8:16 AM      Result Value Range Status   MRSA by PCR NEGATIVE  NEGATIVE Final   Comment:            The GeneXpert MRSA Assay (FDA     approved for NASAL specimens     only), is one component of a     comprehensive MRSA colonization     surveillance program. It is not     intended to diagnose MRSA     infection nor to guide or     monitor treatment for     MRSA infections.     Impression/Recommendation   67 year old with ESRD on HD via HD catheter but more recently with LUE AV graft, now admitted with Coag Neg Staph bacteremia, sp removal of IJ line  #1 coag negative staph bacteremia: Coag Negative staph can be esp virulent in particular Staphylococcus lugdunensis and can have predeliction for heart valves and be esp destructive  --obtain TEE --lines are out --repeat blood cultures post removal of lines --narrowed to vancomycin --he needs MINIMUM of 2 weeks of IV vancomycin IF the TEE excludes endocarditis and even in that case I might prefer to actually push  For 4 weeks since he has GRAFT that while NOT overtly infected could be nidus  --will consider + rifampin given graft presence once we have negative cultures post HD catheter removal  --please make sure [facility]VS has carefully examiend the LUE graft site  #2 Screening: check HIV and hep C  Dr. Johnnye Sima takes over service tomorrow.  Thank you so much for this interesting consult  Leslie for [place] [phone] (pager) [phone] (office) 06/11/2013, 3:37 PM  Rhina [place] 06/11/2013, 3:37 PM

## 2013-06-11 NOTE — Progress Notes (Signed)
Tele was d/C as per protocol.

## 2013-06-11 NOTE — Progress Notes (Signed)
[facility] services arranged by Kerby Nora RN Case Manager with [facility] for HHRN/PT/nurses aide/SW as pt requested. Mindi Slicker RN,BSN,MHA

## 2013-06-11 NOTE — Progress Notes (Signed)
Renal Service Daily Progress Note  Subjective: 4kg off with HD yesterday, BP dropped into 80's, feels and looks better today. CXR shows resolution of pulm edema from this am.  Physical Exam:  Blood pressure 117/49, pulse 95, temperature 98.4 F (36.9 C), temperature source Oral, resp. rate 18, height 5\' 9"  (1.753 m), weight 113.8 kg (250 lb 14.1 oz), SpO2 97.00%. Gen: up in chair, no distress, looks much better Neck: no jvd Chest: clear to bases bilat Cor: reg no rub or M Abd: obese, nontender, no HSM Ext: no LE edema Access: LUA AVG patent and L IJ cath  Dialysis Orders: MWF @ [facility]  4hr 15 min  Dry wt 117.5kg   Bath 2K/2Ca  Heparin 6000 bolus > 3000 mid  Lt PC/ Mature L AVG placed 7/22 Hectorol 2 mcg IV/HD Epogen 0 Units IV/HD Venofer 0  Labs: Hgb 12.6, Phos 6.7, PTH 263.8 Tsat 34% on 7/23   Assessment:  1. Pulm edema / SOB /^trop- cxr clear today, needs new lower dry weight in the 110-112 kg range 2. Hyperkalemia- K 5.3 yest > will repeat 3. CNSS sepsis (2/2 +blood cx's)- possibly due to cath sepsis > cath has been removed (8/27), on IV vanc, for TEE 4. ESRD, cont mwf HD, s/p removal IJ cath 5. HTN- BP too low for coreg, have d/c'd 6. Anemia - Hgb 12.6, no ESA for now 7. MBD- cont binders x 2, vit D and sensipar 8. DM2 - on insulin 9. CAD - moderate disease by cath in 2013, medical Rx 10. Hep C   Plan- HD in am   Kelly Splinter  MD Pager [phone]    Cell  [phone] 06/11/2013, 9:56 AM    Recent Labs Lab 06/09/13 0340 06/09/13 0812 06/10/13 0500  NA 134* 138 134*  K 6.5* 5.9* 5.3*  CL 96 98 94*  CO2 27 27 26   GLUCOSE 114* 114* 118*  BUN 35* 36* 50*  CREATININE 7.77* 8.45* 10.61*  CALCIUM 9.0 9.3 9.2  PHOS  --   --  6.4*    Recent Labs Lab 06/10/13 0500  ALBUMIN 2.9*    Recent Labs Lab 06/08/13 0240 06/08/13 0438 06/09/13 0340 06/11/13 0720  WBC 13.0*  --  9.8 9.4  HGB 11.5* 12.6* 11.3* 11.9*  HCT 34.1* 37.0* 35.0* 35.8*  MCV 91.2  --  92.8  92.3  PLT 259  --  245 270   . allopurinol  100 mg Oral Daily  . amitriptyline  25 mg Oral QHS  . aspirin  325 mg Oral Daily  . calcium acetate  667 mg Oral TID WC  . cinacalcet  60 mg Oral Q breakfast  . clopidogrel  75 mg Oral QAC breakfast  . dextrose  25 mL Intravenous Once  . doxercalciferol  2 mcg Intravenous Q M,W,F-HD  . heparin subcutaneous  5,000 Units Subcutaneous Q8H  . insulin aspart  10 Units Intravenous Once  . insulin aspart  15 Units Subcutaneous TID WC  . insulin NPH  20 Units Subcutaneous BID AC & HS  . losartan  25 mg Oral Daily  . multivitamin  1 tablet Oral Daily  . sevelamer carbonate  2,400 mg Oral TID WC  . simvastatin  20 mg Oral QHS  . [START ON 06/12/2013] vancomycin  1,000 mg Intravenous Q M,W,F-HD   . sodium chloride     acetaminophen, albuterol, colchicine, ondansetron (ZOFRAN) IV

## 2013-06-11 NOTE — Progress Notes (Signed)
Subjective: Patient says his breathing is better but not at baseline.  No CP Objective: Filed Vitals:   06/10/13 1532 06/10/13 1613 06/10/13 2047 06/11/13 0503  BP:  138/30 116/48 117/49  Pulse: 105 99 103 95  Temp:  98.5 F (36.9 C) 98.2 F (36.8 C) 98.4 F (36.9 C)  TempSrc:  Oral Oral Oral  Resp:  18 18 18   Height:      Weight:   250 lb 14.1 oz (113.8 kg)   SpO2: 99% 98% 97% 97%   Weight change: 3 lb 15.5 oz (1.8 kg)  Intake/Output Summary (Last 24 hours) at 06/11/13 0852 Last data filed at 06/11/13 0504  Gross per 24 hour  Intake    840 ml  Output   4106 ml  Net  -3266 ml    General: Alert, awake, oriented x3, in no acute distress Neck:  JVP is full  Difficult to assess JVPl Heart: Regular rate and rhythm, without murmurs, rubs, gallops.  Lungs: Some decreased airflow  Rhonchi. Exemities:  No edema.   Neuro: Grossly intact, nonfocal.  Tele:  SR  Lab Results: Results for orders placed during the hospital encounter of 06/08/13 (from the past 24 hour(s))  GLUCOSE, CAPILLARY     Status: None   Collection Time    06/10/13 12:48 PM      Result Value Range   Glucose-Capillary 89  70 - 99 mg/dL  HEMOGLOBIN A1C     Status: Abnormal   Collection Time    06/10/13  2:35 PM      Result Value Range   Hemoglobin A1C 6.6 (*) <5.7 %   Mean Plasma Glucose 143 (*) <117 mg/dL  GLUCOSE, CAPILLARY     Status: Abnormal   Collection Time    06/10/13  4:35 PM      Result Value Range   Glucose-Capillary 223 (*) 70 - 99 mg/dL   Comment 1 Notify RN    GLUCOSE, CAPILLARY     Status: None   Collection Time    06/10/13  9:47 PM      Result Value Range   Glucose-Capillary 96  70 - 99 mg/dL  CBC     Status: Abnormal   Collection Time    06/11/13  7:20 AM      Result Value Range   WBC 9.4  4.0 - 10.5 K/uL   RBC 3.88 (*) 4.22 - 5.81 MIL/uL   Hemoglobin 11.9 (*) 13.0 - 17.0 g/dL   HCT 35.8 (*) 39.0 - 52.0 %   MCV 92.3  78.0 - 100.0 fL   MCH 30.7  26.0 - 34.0 pg   MCHC 33.2  30.0  - 36.0 g/dL   RDW 14.2  11.5 - 15.5 %   Platelets 270  150 - 400 K/uL    Studies/Results: @RISRSLT24 @  Medications: Reviewed   @PROBHOSP @  1.  Acute on chronic systolic CHF  Undergoing dialysis.  Would ocntinue for volume removal Patient says he was doing OK last wk until end of run at dialysis Fri  He became hypotensive  Dialysis stopped early. Got more SOB over weekend I would continue medical Rx  On cozaar  Not on a b blocker.  WOuld start very low dose lopressor 12.5 bid.  Follow BP.  2.  CAD  No symptoms of ischemia  3.  CKD  Continue dialysis.  4.   HL  Continue statin.  LOS: 3 days   Dorris Carnes 06/11/2013, 8:52 AM

## 2013-06-11 NOTE — Progress Notes (Signed)
8/28  Consider adding Novolog SENSITIVE correction scale AC &HS while in the hospital.  Harvel Ricks RN BSN CDE

## 2013-06-11 NOTE — Progress Notes (Addendum)
Pt.S blood sugar drop to 34,pt. Has been NPO since 9 AM. 50 ml of 50% dextrose were given.blood sugar was recheck,it came up to 143.keep monitoring pt. Closely.Endoscopy RN has been notified.

## 2013-06-11 NOTE — CV Procedure (Addendum)
Procedure: TEE  Indication: Endocarditis evaluation  Sedation: Versed 4 mg IV, Fentanyl 50 mcg IV  Findings: Please see echo section of chart for full report.  Normal LV size with mild LV hypertrophy.  EF 30% with anteroseptal and inferoseptal severe hypokinesis; anterior hypokinesis.  Normal RV size and systolic function.  Trivial MR.  No evidence for endocarditis.   No complications.   Ernest [name] 06/11/2013

## 2013-06-11 NOTE — Progress Notes (Addendum)
[facility]                                                                                Patient Demographics  Ernest [name], is a 67 y.o. male, DOB - 01-11-1946, OV:[id number], [id number]  Admit date - 06/08/2013  Admitting Physician [name], MD  Outpatient Primary MD for the patient is Louis Meckel, MD  LOS - 3   Chief Complaint  Patient presents with  . Shortness of Breath  . Hypoglycemia       Brief narrative:  67 y.o. male with history of ESRD on hemodialysis MWF, moderate CAD, diabetes on insulin, and hypertension who presented to the ER with complaints of SOB. He completed his dialysis on Friday, subsequently had increasing shortness of breath for 2 days. Denied any chest pain, cough congestion fevers or chills. His breathing had worsened and he was sent to the ER by EMS. He reported compliance with medications and salt/fluid restriction, denied any PND he reported mild orthopnea, no lower extremity edema. Upon evaluation in the ER he was noted to have acute respiratory failure/pulmonary edema requiring BiPAP, hyperkalemia and elevated troponin. He is been dialyzed for fluid removal by nephrology, cardiology is following the patient for medical management of his CHF and underlying history of CAD.    Assessment/Plan:   Acute respiratory failure with hypoxia/Volume overload due to Acute on chronic combined systolic and diastolic heart in a patient with ESRD on Monday Wednesday Friday dialysis.  Continues to improve with dialysis and fluid removal, getting close to dry weight, off of BiPAP and oxygen being titrated down, cardiology and renal following the patient. He is currently on Coreg aspirin, Plavix along with statin. Will resume low dose ARB if tolerated by blood pressure.     Elevation of cardiac enzymes/CAD  -last cath 2013 which showed 50% eccentric ostial left main stenosis, 50-70% stenosis in prox LAD, 20% ramus and 50% proximal RCA and  medical management was recommended.-Cards says no indication to pursue cath despite ECHO findings this admission and focus instead on med rxn  -Cards following and note elevated enzymes likely due to acute volume overload  -cont aspirin, statin, Plavix and Coreg, patient remains pain and symptom free at this time      Acute systolic heart failure/Grade 1 DD  -as above/primary tx will be HD and meds   ECHO demonstrates significant decrease in EF from 60% to 30-35% with grade 1 DD- also with new WMA in septum   - last cath 11/13  Left mainstem: There is a 50% eccentric ostial left main stenosis.   Left anterior descending (LAD): The LAD is heavily calcified throughout the proximal vessel. There is 50-70% stenosis in the proximal vessel followed by segment of 40% disease in the proximal to mid vessel.  The ramus intermediate branch is moderate in size and has mild disease at the ostium up to 30%.  Left circumflex (LCx): The left circumflex has minor irregularities up to 20%.  Right coronary artery (RCA): The right coronary has a 50% stenosis in the proximal vessel. The remainder of the vessel is without significant disease.  Left ventriculography: There is apical hypokinesis with mild left ventricular  dysfunction. Ejection fraction is estimated at 50%.     ESRD on dialysis Monday Wednesday Friday Renal following Monday Wednesday Friday dialysis.     Diabetes mellitus  -CBG controlled  -continue NPH insulin w/ SSI   Lab Results  Component Value Date   HGBA1C 6.6* 06/10/2013    CBG (last 3)   Recent Labs  06/10/13 1248 06/10/13 1635 06/10/13 2147  GLUCAP 89 223* 96      Abnormal blood cx's  2 out of 2 blood cultures positive for coag-negative staph, patient did have a dialysis catheter in his groin which was removed on 06/10/2013, case discussed with ID for now he has been placed on vancomycin, surveillance blood cultures have been redrawn, plan is to get cardiology to  do a TEE to rule out any cardiac vegetations was eating, likely source of infection could have been the groin dialysis catheter.    HTN  -cont BB and ARB     Mixed hyperlipidemia  -cont Zocor    Suspected OHS/SA  -needs outpt sleep study as he almost assuredly has OHS/SA    Code Status: Full  Family Communication: none present  Disposition Plan: HHPT   Procedures    2-D echocardiogram:   - Left ventricle: The cavity size was normal. Wall thickness was increased in a pattern of mild LVH. Systolic function was moderately to severely reduced. The estimated ejection fraction was in the range of 30% to 35%. There is hypokinesis of the anteroseptal and inferoseptal myocardium. Doppler parameters are consistent with abnormal left ventricular relaxation (grade 1 diastolic dysfunction). - Mitral valve: Calcified annulus. Mildly thickened leaflets   TEE    Consults  Renal, cards   DVT Prophylaxis    Heparin   Lab Results  Component Value Date   PLT 270 06/11/2013    Medications  Scheduled Meds: . allopurinol  100 mg Oral Daily  . amitriptyline  25 mg Oral QHS  . aspirin  325 mg Oral Daily  . calcium acetate  667 mg Oral TID WC  . cinacalcet  60 mg Oral Q breakfast  . clopidogrel  75 mg Oral QAC breakfast  . dextrose  25 mL Intravenous Once  . doxercalciferol  2 mcg Intravenous Q M,W,F-HD  . heparin subcutaneous  5,000 Units Subcutaneous Q8H  . insulin aspart  10 Units Intravenous Once  . insulin aspart  15 Units Subcutaneous TID WC  . insulin NPH  20 Units Subcutaneous BID AC & HS  . losartan  25 mg Oral Daily  . multivitamin  1 tablet Oral Daily  . sevelamer carbonate  2,400 mg Oral TID WC  . simvastatin  20 mg Oral QHS  . [START ON 06/12/2013] vancomycin  1,000 mg Intravenous Q M,W,F-HD   Continuous Infusions:  PRN Meds:.acetaminophen, albuterol, colchicine, ondansetron (ZOFRAN) IV  Antibiotics     Anti-infectives   Start     Dose/Rate Route Frequency  Ordered Stop   06/12/13 1200  vancomycin (VANCOCIN) IVPB 1000 mg/200 mL premix     1,000 mg 200 mL/hr over 60 Minutes Intravenous Every M-W-F (Hemodialysis) 06/10/13 1517     06/10/13 1600  vancomycin (VANCOCIN) 2,500 mg in sodium chloride 0.9 % 500 mL IVPB     2,500 mg 250 mL/hr over 120 Minutes Intravenous  Once 06/10/13 1517 06/10/13 1840   06/08/13 0315  levofloxacin (LEVAQUIN) IVPB 750 mg     750 mg 100 mL/hr over 90 Minutes Intravenous  Once 06/08/13 0301 06/08/13 0556  Time Spent in minutes  40   Elnore Cosens K M.D on 06/11/2013 at 9:43 AM  Between 7am to 7pm - Pager - [phone]  After 7pm go to [email or web address] - password [email or web address]  And look for the night coverage person covering for me after hours  [facility] Office  [phone]    Subjective:   Ernest [name] today has, No headache, No chest pain, No abdominal pain - No Nausea, No new weakness tingling or numbness, No Cough - SOB.    Objective:   Filed Vitals:   06/10/13 1532 06/10/13 1613 06/10/13 2047 06/11/13 0503  BP:  138/30 116/48 117/49  Pulse: 105 99 103 95  Temp:  98.5 F (36.9 C) 98.2 F (36.8 C) 98.4 F (36.9 C)  TempSrc:  Oral Oral Oral  Resp:  18 18 18   Height:      Weight:   113.8 kg (250 lb 14.1 oz)   SpO2: 99% 98% 97% 97%    Wt Readings from Last 3 Encounters:  06/10/13 113.8 kg (250 lb 14.1 oz)  06/10/13 113.8 kg (250 lb 14.1 oz)  05/05/13 117.255 kg (258 lb 8 oz)     Intake/Output Summary (Last 24 hours) at 06/11/13 0943 Last data filed at 06/11/13 0504  Gross per 24 hour  Intake    840 ml  Output   4106 ml  Net  -3266 ml    Exam Awake Alert, Oriented X 3, No new F.N deficits, Normal affect Veteran.AT,PERRAL Supple Neck,No JVD, No cervical lymphadenopathy appriciated.  Symmetrical Chest wall movement, Good air movement bilaterally, +ve rales RRR,No Gallops,Rubs or new Murmurs, No Parasternal Heave +ve B.Sounds, Abd Soft, Non tender, No organomegaly  appriciated, No rebound - guarding or rigidity. No Cyanosis, Clubbing or edema, No new Rash or bruise     Data Review   Micro Results Recent Results (from the past 240 hour(s))  CULTURE, BLOOD (ROUTINE X 2)     Status: None   Collection Time    06/08/13  3:45 AM      Result Value Range Status   Specimen Description BLOOD RIGHT HAND   Final   Special Requests BOTTLES DRAWN AEROBIC AND ANAEROBIC 5CC EACH   Final   Culture  Setup Time     Final   Value: 06/08/2013 10:57     Performed at [facility]   Culture     Final   Value: STAPHYLOCOCCUS SPECIES (COAGULASE NEGATIVE)     Note: RIFAMPIN AND GENTAMICIN SHOULD NOT BE USED AS SINGLE DRUGS FOR TREATMENT OF STAPH INFECTIONS. This organism DOES NOT demonstrate inducible Clindamycin resistance in vitro.     Note: Gram Stain Report Called to,Read Back By and Verified With: KRISTAL R @ 0825 06/09/13 BY KRAWS     Performed at [facility]   Report Status 06/11/2013 FINAL   Final   Organism ID, Bacteria STAPHYLOCOCCUS SPECIES (COAGULASE NEGATIVE)   Final  CULTURE, BLOOD (ROUTINE X 2)     Status: None   Collection Time    06/08/13  4:30 AM      Result Value Range Status   Specimen Description BLOOD RIGHT HAND   Final   Special Requests BOTTLES DRAWN AEROBIC ONLY 8CC   Final   Culture  Setup Time     Final   Value: 06/08/2013 10:57     Performed at [facility]   Culture     Final   Value: [place]  Note: SUSCEPTIBILITIES PERFORMED ON PREVIOUS CULTURE WITHIN THE LAST 5 DAYS.     STAPHYLOCOCCUS SPECIES (COAGULASE NEGATIVE)     Note: Gram Stain Report Called to,Read Back By and Verified With: KRISTAL R @ 1520 06/09/13 BY KRAWS     Performed at [facility]   Report Status 06/11/2013 FINAL   Final  MRSA PCR SCREENING     Status: None   Collection Time    06/08/13  8:16 AM      Result Value Range Status   MRSA by PCR NEGATIVE  NEGATIVE Final   Comment:            The GeneXpert MRSA  Assay (FDA     approved for NASAL specimens     only), is one component of a     comprehensive MRSA colonization     surveillance program. It is not     intended to diagnose MRSA     infection nor to guide or     monitor treatment for     MRSA infections.    Radiology Reports Dg Chest 2 View  06/09/2013   *RADIOLOGY REPORT*  Clinical Data: Follow up of pulmonary edema.  Weakness.  Shortness of breath.  CHEST - 2 VIEW  Comparison: 1 day prior  Findings: A dialysis catheter which terminates in the right atrium.  Cardiomegaly, accentuated by low lung volumes on the frontal. Small bilateral pleural effusions, likely similar. No pneumothorax. Density difference over the lateral left hemithorax is favored to be due to a skin fold.  Slight improvement in moderate interstitial edema.  Persistent left greater than right bibasilar airspace disease.  Vascular stent projecting over the right upper lung.  IMPRESSION: Slight improvement in moderate congestive heart failure.  Small bilateral pleural effusions with adjacent airspace disease, likely atelectasis.  The left base air space disease is slightly progressive, and developing infection is difficult to exclude.  Probable skin fold over the lateral left hemithorax.   Original Report Authenticated By: Abigail Miyamoto, M.D.   Dg Chest Portable 1 View  06/08/2013   *RADIOLOGY REPORT*  Clinical Data: Shortness of breath  PORTABLE CHEST - 1 VIEW  Comparison: 03/26/2013  Findings: Heart size upper normal to mildly enlarged with central vascular congestion.  Interstitial prominence and perihilar increased markings.  Left lung consolidation.  Layering pleural effusions not excluded.  Multilevel degenerative change.  No pneumothorax.  Left sided large bore catheter with tips that project over the cavoatrial junction.  No interval osseous change.  IMPRESSION: Central vascular congestion and pulmonary edema pattern.  Left lung base opacity; atelectasis versus infiltrate.    Original Report Authenticated By: Carlos Levering, M.D.    [facility] 06/08/13 0240 06/08/13 0438 06/09/13 0340 06/11/13 0720  WBC 13.0*  --  9.8 9.4  HGB 11.5* 12.6* 11.3* 11.9*  HCT 34.1* 37.0* 35.0* 35.8*  PLT 259  --  245 270  MCV 91.2  --  92.8 92.3  MCH 30.7  --  30.0 30.7  MCHC 33.7  --  32.3 33.2  RDW 14.4  --  14.6 14.2    Chemistries   Recent Labs Lab 06/08/13 0241 06/08/13 0438 06/09/13 0340 06/09/13 0812 06/10/13 0500  NA 136 135 134* 138 134*  K 5.4* 6.5* 6.5* 5.9* 5.3*  CL 94* 102 96 98 94*  CO2 22  --  27 27 26   GLUCOSE 143* 187* 114* 114* 118*  BUN 51* 52* 35* 36* 50*  CREATININE 10.63*  10.90* 7.77* 8.45* 10.61*  CALCIUM 9.7  --  9.0 9.3 9.2   ------------------------------------------------------------------------------------------------------------------ estimated creatinine clearance is 8.7 ml/min (by C-G formula based on Cr of 10.61). ------------------------------------------------------------------------------------------------------------------  Recent Labs  06/10/13 1435  HGBA1C 6.6*   ------------------------------------------------------------------------------------------------------------------ No results found for this basename: CHOL, HDL, LDLCALC, TRIG, CHOLHDL, LDLDIRECT,  in the last 72 hours ------------------------------------------------------------------------------------------------------------------ No results found for this basename: TSH, T4TOTAL, FREET3, T3FREE, THYROIDAB,  in the last 72 hours ------------------------------------------------------------------------------------------------------------------ No results found for this basename: VITAMINB12, FOLATE, FERRITIN, TIBC, IRON, RETICCTPCT,  in the last 72 hours  Coagulation profile No results found for this basename: INR, PROTIME,  in the last 168 hours  No results found for this basename: DDIMER,  in the last 72 hours  Cardiac Enzymes  Recent  Labs Lab 06/08/13 0241 06/08/13 0437 06/08/13 0826 06/08/13 1440 06/08/13 1825  CKMB  --  6.9*  --   --   --   TROPONINI 0.49* 0.56* 0.55* 0.88* 0.61*   ------------------------------------------------------------------------------------------------------------------ No components found with this basename: POCBNP,

## 2013-06-12 ENCOUNTER — Encounter (HOSPITAL_COMMUNITY): Payer: Self-pay | Admitting: Cardiology

## 2013-06-12 DIAGNOSIS — N186 End stage renal disease: Secondary | ICD-10-CM | POA: Diagnosis not present

## 2013-06-12 DIAGNOSIS — I509 Heart failure, unspecified: Secondary | ICD-10-CM | POA: Diagnosis not present

## 2013-06-12 DIAGNOSIS — I5023 Acute on chronic systolic (congestive) heart failure: Secondary | ICD-10-CM | POA: Diagnosis not present

## 2013-06-12 DIAGNOSIS — E162 Hypoglycemia, unspecified: Secondary | ICD-10-CM | POA: Diagnosis not present

## 2013-06-12 LAB — CBC
Hemoglobin: 10.8 g/dL — ABNORMAL LOW (ref 13.0–17.0)
MCHC: 33.9 g/dL (ref 30.0–36.0)
WBC: 9.1 10*3/uL (ref 4.0–10.5)

## 2013-06-12 LAB — RENAL FUNCTION PANEL
CO2: 26 mEq/L (ref 19–32)
Calcium: 9.1 mg/dL (ref 8.4–10.5)
Chloride: 96 mEq/L (ref 96–112)
GFR calc Af Amer: 5 mL/min — ABNORMAL LOW (ref >90)
GFR calc non Af Amer: 5 mL/min — ABNORMAL LOW (ref >90)
Potassium: 5.1 mEq/L (ref 3.5–5.1)
Sodium: 135 mEq/L (ref 135–145)

## 2013-06-12 LAB — GLUCOSE, CAPILLARY
Glucose-Capillary: 140 mg/dL — ABNORMAL HIGH (ref 70–99)
Glucose-Capillary: 186 mg/dL — ABNORMAL HIGH (ref 70–99)

## 2013-06-12 MED ORDER — RIFAMPIN 300 MG PO CAPS
300.0000 mg | ORAL_CAPSULE | Freq: Every day | ORAL | Status: DC
  Administered 2013-06-12: 300 mg via ORAL
  Filled 2013-06-12: qty 1

## 2013-06-12 MED ORDER — DOXERCALCIFEROL 4 MCG/2ML IV SOLN
INTRAVENOUS | Status: AC
  Administered 2013-06-12: 2 ug via INTRAVENOUS
  Filled 2013-06-12: qty 2

## 2013-06-12 MED ORDER — VANCOMYCIN HCL IN DEXTROSE 1-5 GM/200ML-% IV SOLN: 1000.0000 mg | INTRAVENOUS | Status: DC

## 2013-06-12 MED ORDER — RIFAMPIN 300 MG PO CAPS: 300.0000 mg | ORAL_CAPSULE | Freq: Every day | ORAL | Status: DC

## 2013-06-12 NOTE — Progress Notes (Signed)
INFECTIOUS DISEASE PROGRESS NOTE  ID: Ernest [name] is a 67 y.o. male with  Active Problems:   Mixed hyperlipidemia   CAD   CKD (chronic kidney disease) stage V requiring chronic dialysis   Acute respiratory failure with hypoxia   Elevation of cardiac enzymes   Diabetes mellitus   Volume overload   Acute on chronic systolic CHF (congestive heart failure)   Cardiomyopathy, ischemic  Subjective: Without complaints  Abtx:  Anti-infectives   Start     Dose/Rate Route Frequency Ordered Stop   06/12/13 1200  vancomycin (VANCOCIN) IVPB 1000 mg/200 mL premix     1,000 mg 200 mL/hr over 60 Minutes Intravenous Every M-W-F (Hemodialysis) 06/10/13 1517     06/12/13 0000  vancomycin (VANCOCIN) 1 GM/200ML SOLN     1,000 mg 200 mL/hr over 60 Minutes Intravenous Every M-W-F (Hemodialysis) 06/12/13 1239     06/10/13 1600  vancomycin (VANCOCIN) 2,500 mg in sodium chloride 0.9 % 500 mL IVPB     2,500 mg 250 mL/hr over 120 Minutes Intravenous  Once 06/10/13 1517 06/10/13 1840   06/08/13 0315  levofloxacin (LEVAQUIN) IVPB 750 mg     750 mg 100 mL/hr over 90 Minutes Intravenous  Once 06/08/13 0301 06/08/13 0556      Medications:  Scheduled: . allopurinol  100 mg Oral Daily  . amitriptyline  25 mg Oral QHS  . aspirin  325 mg Oral Daily  . calcium acetate  667 mg Oral TID WC  . cinacalcet  60 mg Oral Q breakfast  . clopidogrel  75 mg Oral QAC breakfast  . doxercalciferol  2 mcg Intravenous Q M,W,F-HD  . heparin subcutaneous  5,000 Units Subcutaneous Q8H  . insulin aspart  10 Units Intravenous Once  . insulin aspart  15 Units Subcutaneous TID WC  . insulin NPH  20 Units Subcutaneous BID AC & HS  . losartan  25 mg Oral Daily  . metoprolol tartrate  12.5 mg Oral BID  . multivitamin  1 tablet Oral Daily  . sevelamer carbonate  2,400 mg Oral TID WC  . simvastatin  20 mg Oral QHS  . vancomycin  1,000 mg Intravenous Q M,W,F-HD    Objective: Vital signs in last 24 hours: Temp:  [97.4  F (36.3 C)-98.4 F (36.9 C)] 97.7 F (36.5 C) (08/29 1300) Pulse Rate:  [85-105] 94 (08/29 1500) Resp:  [10-47] 16 (08/29 1300) BP: (85-157)/(25-76) 134/39 mmHg (08/29 1500) SpO2:  [91 %-99 %] 94 % (08/29 1300) Weight:  [112 kg (246 lb 14.6 oz)-117.935 kg (260 lb)] 112 kg (246 lb 14.6 oz) (08/29 1210)   General appearance: alert, cooperative and no distress Resp: clear to auscultation bilaterally Cardio: regular rate and rhythm GI: normal findings: bowel sounds normal and soft, non-tender Extremities: RUE AVG is clean.   Lab Results  Recent Labs  06/11/13 0720 06/12/13 0830  WBC 9.4 9.1  HGB 11.9* 10.8*  HCT 35.8* 31.9*  NA 137 135  K 5.4* 5.1  CL 99 96  CO2 27 26  BUN 29* 40*  CREATININE 7.81* 10.29*   Liver Panel  Recent Labs  06/10/13 0500 06/12/13 0830  ALBUMIN 2.9* 3.0*   Sedimentation Rate No results found for this basename: ESRSEDRATE,  in the last 72 hours C-Reactive Protein No results found for this basename: CRP,  in the last 72 hours  Microbiology: Recent Results (from the past 240 hour(s))  CULTURE, BLOOD (ROUTINE X 2)     Status: None  Collection Time    06/08/13  3:45 AM      Result Value Range Status   Specimen Description BLOOD RIGHT HAND   Final   Special Requests BOTTLES DRAWN AEROBIC AND ANAEROBIC [facility] EACH   Final   Culture  Setup Time     Final   Value: 06/08/2013 10:57     Performed at [facility]   Culture     Final   Value: STAPHYLOCOCCUS SPECIES (COAGULASE NEGATIVE)     Note: RIFAMPIN AND GENTAMICIN SHOULD NOT BE USED AS SINGLE DRUGS FOR TREATMENT OF STAPH INFECTIONS. This organism DOES NOT demonstrate inducible Clindamycin resistance in vitro.     Note: Gram Stain Report Called to,Read Back By and Verified With: KRISTAL R @ 0825 06/09/13 BY KRAWS     Performed at [facility]   Report Status 06/11/2013 FINAL   Final   Organism ID, Bacteria STAPHYLOCOCCUS SPECIES (COAGULASE NEGATIVE)   Final  CULTURE, BLOOD  (ROUTINE X 2)     Status: None   Collection Time    06/08/13  4:30 AM      Result Value Range Status   Specimen Description BLOOD RIGHT HAND   Final   Special Requests BOTTLES DRAWN AEROBIC ONLY 8CC   Final   Culture  Setup Time     Final   Value: 06/08/2013 10:57     Performed at [facility]   Culture     Final   Value: GRAM POSITIVE COCCI IN CLUSTERS     Note: SUSCEPTIBILITIES PERFORMED ON PREVIOUS CULTURE WITHIN THE LAST 5 DAYS.     STAPHYLOCOCCUS SPECIES (COAGULASE NEGATIVE)     Note: Gram Stain Report Called to,Read Back By and Verified With: KRISTAL R @ 1520 06/09/13 BY KRAWS     Performed at [facility]   Report Status 06/11/2013 FINAL   Final  MRSA PCR SCREENING     Status: None   Collection Time    06/08/13  8:16 AM      Result Value Range Status   MRSA by PCR NEGATIVE  NEGATIVE Final   Comment:            The GeneXpert MRSA Assay (FDA     approved for NASAL specimens     only), is one component of a     comprehensive MRSA colonization     surveillance program. It is not     intended to diagnose MRSA     infection nor to guide or     monitor treatment for     MRSA infections.  CULTURE, BLOOD (ROUTINE X 2)     Status: None   Collection Time    06/10/13  4:12 PM      Result Value Range Status   Specimen Description BLOOD RIGHT HAND   Final   Special Requests BOTTLES DRAWN AEROBIC ONLY 3CC   Final   Culture  Setup Time     Final   Value: 06/11/2013 01:55     Performed at [facility]   Culture     Final   Value:        BLOOD CULTURE RECEIVED NO GROWTH TO DATE CULTURE WILL BE HELD FOR 5 DAYS BEFORE ISSUING A FINAL NEGATIVE REPORT     Performed at [facility]   Report Status PENDING   Incomplete  CULTURE, BLOOD (ROUTINE X 2)     Status: None   Collection Time    06/10/13  4:13 PM      Result Value Range Status   Specimen Description BLOOD RIGHT ARM   Final   Special Requests BOTTLES DRAWN AEROBIC ONLY 3CC   Final   Culture  Setup  Time     Final   Value: 06/11/2013 01:55     Performed at [facility]   Culture     Final   Value:        BLOOD CULTURE RECEIVED NO GROWTH TO DATE CULTURE WILL BE HELD FOR 5 DAYS BEFORE ISSUING A FINAL NEGATIVE REPORT     Performed at [facility]   Report Status PENDING   Incomplete    Studies/Results: Dg Chest 2 View  06/11/2013   *RADIOLOGY REPORT*  Clinical Data: Shortness of breath.  Follow up of pulmonary edema.  CHEST - 2 VIEW  Comparison: 06/09/13.  Findings: Removal of dialysis catheter. Cardiomegaly accentuated by AP portable technique.  A small left pleural effusion is slightly decreased. Cannot exclude minimal loculation laterally.  No pneumothorax.  Interstitial edema is decreased and mild.  Improved bibasilar airspace disease.  Increased density projecting over the left side of the thoracic inlet is indeterminate. Possibly related to degenerative change in costovertebral articulations.  Similar back to 03/01/2013.  IMPRESSION: Improved aeration with decreased left base air space disease and adjacent small left pleural effusion.  Improved congestive heart failure.   Original Report Authenticated By: Abigail Miyamoto, M.D.     Assessment/Plan: CNS bacteremia Infected HD catheter New AVG  Total days of antibiotics: 3  Repeat BCx 8-27 are ngtd Would plan for 28 days of vanco and rifampin (due to presence of prosthetic).          Will have him f/u in ID clinic in 2-3 weeks. Will need f/u bcx  Bobby Rumpf Infectious Diseases (pager) [phone] [email or web address] 06/12/2013, 4:35 PM  LOS: 4 days

## 2013-06-12 NOTE — Progress Notes (Signed)
8/29  CBGs on 8/28 93-34/93-63-166 mg/dl   8/29   95 mg/dl Recommend decreasing NPH dose to 15 units BID and decrease Novolog meal coverage to 5 TID.  Add Novolog SENSITIVE correction scale AC & HS.   Patient to get meal coverage only if eating greater than 50% of meals and blood sugar greater than 80 mg/dl.  Will continue to follow while in hospital.  Harvel Ricks RN BSN CDE

## 2013-06-12 NOTE — Progress Notes (Signed)
Subjective:   Feeling better, some sob with exertion but much improved. Should be going home today. No complaints.  Objective Filed Vitals:   06/12/13 0756 06/12/13 0830 06/12/13 0900 06/12/13 0930  BP: 137/47 137/41 129/51 122/38  Pulse: 85 87 89 92  Temp:      TempSrc:      Resp: 16     Height:      Weight:      SpO2:       Physical Exam General: alert and oriented. On HD. No acute distress. Heart: RRR no murmur Lungs: clear and unlabored Abdomen: soft, round, obese. Nontender, +BS Extremities: No edema Dialysis Access:  LUA AVG patent on HD  Dialysis Orders: MWF @ [facility]  4hr 15 min Dry wt 117.5kg Bath 2K/2Ca Heparin 6000 bolus > 3000 mid Lt PC/ Mature L AVG placed 7/22  Hectorol 2 mcg IV/HD Epogen 0 Units IV/HD Venofer 0  Labs: Hgb 12.6, Phos 6.7, PTH 263.8 Tsat 34% on 7/23    Assessment/Plan: 1. Pulm edema/sob- much improved per pt, decrease EDW at DC 2. Sepsis- probable cath sepsis, cath removed 8/27. Blood cultures- coag negative staph. On 1 g Vanc Q HD- cont at DC. TEE negative 3. ESRD - MWF @ [facility]- HD now. uf goal 5000.  K+5.1 4. Anemia - hgb 10.8- no ESA, cont to monitor 5. Secondary hyperparathyroidism - Ca+ 9.1/ phos 5.8. phoslo and renvela with meals.  Sensipar and hecorol 6. HTN/volume -  BP 129/51. Cozaar and metoprolol.  7. Nutrition -  Alb 3.0. Renal diet. Multivitamin. Reports good appetite. 8. DM- Aspart with meals and NPH BID   Shelle Iron, NP [place] [phone] 06/12/2013,10:05 AM  LOS: 4 days   Patient seen and examined.  Agree with assessment and plan as above. OK for discharge from renal standpoint. Will lower dry wt substantially.  Kelly Splinter  MD Pager [phone]    Cell  [phone] 06/12/2013, 11:53 AM    Additional Objective Labs: Basic Metabolic Panel:  Recent Labs Lab 06/09/13 0812 06/10/13 0500 06/11/13 0720 06/12/13 0830  NA 138 134* 137 135  K 5.9* 5.3* 5.4* 5.1  CL 98 94* 99 96  CO2 27 26 27 26    GLUCOSE 114* 118* 93 95  BUN 36* 50* 29* 40*  CREATININE 8.45* 10.61* 7.81* 10.29*  CALCIUM 9.3 9.2 9.4 9.1  PHOS  --  6.4*  --  5.8*   Liver Function Tests:  Recent Labs Lab 06/10/13 0500 06/12/13 0830  ALBUMIN 2.9* 3.0*   No results found for this basename: LIPASE, AMYLASE,  in the last 168 hours CBC:  Recent Labs Lab 06/08/13 0240  06/09/13 0340 06/11/13 0720 06/12/13 0830  WBC 13.0*  --  9.8 9.4 9.1  HGB 11.5*  < > 11.3* 11.9* 10.8*  HCT 34.1*  < > 35.0* 35.8* 31.9*  MCV 91.2  --  92.8 92.3 90.4  PLT 259  --  245 270 260  < > = values in this interval not displayed. Blood Culture    Component Value Date/Time   SDES BLOOD RIGHT ARM 06/10/2013 1613   SPECREQUEST BOTTLES DRAWN AEROBIC ONLY 3CC 06/10/2013 1613   CULT  Value:        BLOOD CULTURE RECEIVED NO GROWTH TO DATE CULTURE WILL BE HELD FOR 5 DAYS BEFORE ISSUING A FINAL NEGATIVE REPORT Performed at [facility] 06/10/2013 1613   REPTSTATUS PENDING 06/10/2013 1613    Cardiac Enzymes:  Recent Labs Lab 06/08/13  0241 06/08/13 0437 06/08/13 0826 06/08/13 1440 06/08/13 1825  CKTOTAL  --  209  --   --   --   CKMB  --  6.9*  --   --   --   TROPONINI 0.49* 0.56* 0.55* 0.88* 0.61*   CBG:  Recent Labs Lab 06/11/13 1430 06/11/13 1451 06/11/13 1524 06/11/13 1747 06/11/13 2149  GLUCAP 34* 143* 93 63* 166*   Iron Studies: No results found for this basename: IRON, TIBC, TRANSFERRIN, FERRITIN,  in the last 72 hours @lablastinr3 @ Studies/Results: Dg Chest 2 View  06/11/2013   *RADIOLOGY REPORT*  Clinical Data: Shortness of breath.  Follow up of pulmonary edema.  CHEST - 2 VIEW  Comparison: 06/09/13.  Findings: Removal of dialysis catheter. Cardiomegaly accentuated by AP portable technique.  A small left pleural effusion is slightly decreased. Cannot exclude minimal loculation laterally.  No pneumothorax.  Interstitial edema is decreased and mild.  Improved bibasilar airspace disease.  Increased density  projecting over the left side of the thoracic inlet is indeterminate. Possibly related to degenerative change in costovertebral articulations.  Similar back to 03/01/2013.  IMPRESSION: Improved aeration with decreased left base air space disease and adjacent small left pleural effusion.  Improved congestive heart failure.   Original Report Authenticated By: Abigail Miyamoto, M.D.   Medications: . sodium chloride    . dextrose 5 % and 0.9% NaCl 20 mL/hr at 06/11/13 1533   . allopurinol  100 mg Oral Daily  . amitriptyline  25 mg Oral QHS  . aspirin  325 mg Oral Daily  . calcium acetate  667 mg Oral TID WC  . cinacalcet  60 mg Oral Q breakfast  . clopidogrel  75 mg Oral QAC breakfast  . doxercalciferol  2 mcg Intravenous Q M,W,F-HD  . heparin subcutaneous  5,000 Units Subcutaneous Q8H  . insulin aspart  10 Units Intravenous Once  . insulin aspart  15 Units Subcutaneous TID WC  . insulin NPH  20 Units Subcutaneous BID AC & HS  . losartan  25 mg Oral Daily  . metoprolol tartrate  12.5 mg Oral BID  . multivitamin  1 tablet Oral Daily  . sevelamer carbonate  2,400 mg Oral TID WC  . simvastatin  20 mg Oral QHS  . vancomycin  1,000 mg Intravenous Q M,W,F-HD

## 2013-06-12 NOTE — Progress Notes (Signed)
Patient discharged.  Vancomycin prescription arranged for outpatient dialysis.  Educated patient on AVF.  PO antibiotic prescription called in to [facility], patient notified.  IV discontinued.  Wife called and notified of patient discharge.  Wife to pick up patient from hospital.  Escorted via wheelchair with RN Susie to await ride.

## 2013-06-12 NOTE — Progress Notes (Signed)
Patient ID: Ernest [name], male   DOB: 1946-07-20, 67 y.o.   MRN: [id number] Subjective: Patient says his breathing is better but not at baseline.  No CP Objective: Filed Vitals:   06/11/13 2152 06/12/13 0454 06/12/13 0732 06/12/13 0756  BP:  114/50 133/49 137/47  Pulse:  88 89 85  Temp: 98.2 F (36.8 C) 98.4 F (36.9 C) 97.4 F (36.3 C)   TempSrc: Oral Oral Oral   Resp:  18 18 16   Height:      Weight: 260 lb (117.935 kg)  253 lb 15.5 oz (115.2 kg)   SpO2: 96% 91% 92%    Weight change: 4 lb 7.8 oz (2.035 kg)  Intake/Output Summary (Last 24 hours) at 06/12/13 0839 Last data filed at 06/11/13 1900  Gross per 24 hour  Intake    480 ml  Output      0 ml  Net    480 ml    General: Alert, awake, oriented x3, in no acute distress Neck:  JVP is full  Difficult to assess JVPl Heart: Regular rate and rhythm, without murmurs, rubs, gallops.  Lungs: Some decreased airflow  Rhonchi. Exemities:  No edema.   Neuro: Grossly intact, nonfocal. At dialysis fistula working well   Tele:  SR  BMET    Component Value Date/Time   NA 137 06/11/2013 0720   K 5.4* 06/11/2013 0720   CL 99 06/11/2013 0720   CO2 27 06/11/2013 0720   GLUCOSE 93 06/11/2013 0720   BUN 29* 06/11/2013 0720   CREATININE 7.81* 06/11/2013 0720   CALCIUM 9.4 06/11/2013 0720   GFRNONAA 6* 06/11/2013 0720   GFRAA 7* 06/11/2013 0720      Medications: Reviewed Scheduled Meds: . allopurinol  100 mg Oral Daily  . amitriptyline  25 mg Oral QHS  . aspirin  325 mg Oral Daily  . calcium acetate  667 mg Oral TID WC  . cinacalcet  60 mg Oral Q breakfast  . clopidogrel  75 mg Oral QAC breakfast  . doxercalciferol  2 mcg Intravenous Q M,W,F-HD  . heparin subcutaneous  5,000 Units Subcutaneous Q8H  . insulin aspart  10 Units Intravenous Once  . insulin aspart  15 Units Subcutaneous TID WC  . insulin NPH  20 Units Subcutaneous BID AC & HS  . losartan  25 mg Oral Daily  . metoprolol tartrate  12.5 mg Oral BID  . multivitamin  1  tablet Oral Daily  . sevelamer carbonate  2,400 mg Oral TID WC  . simvastatin  20 mg Oral QHS  . vancomycin  1,000 mg Intravenous Q M,W,F-HD   Continuous Infusions: . sodium chloride    . dextrose 5 % and 0.9% NaCl 20 mL/hr at 06/11/13 1533   PRN Meds:.sodium chloride, sodium chloride, acetaminophen, albuterol, colchicine, feeding supplement (NEPRO CARB STEADY), heparin, heparin, lidocaine (PF), lidocaine-prilocaine, ondansetron (ZOFRAN) IV, pentafluoroprop-tetrafluoroeth      1.  Acute on chronic systolic CHF  Undergoing dialysis.  Would ocntinue for volume removal Patient says he was doing OK last wk until end of run at dialysis Fri  He became hypotensive  Dialysis stopped early. Got more SOB over weekend I would continue medical Rx  Cozaar and lopressor  Follow BP.  2.  CAD  No symptoms of ischemia  Cath 2013 no critical disese and eF 40%  Not much different now  Troponins positive in setting of renal failure but CPK's negative   3.  CKD  Continue dialysis.  4.  HL  Continue statin.  5.  Fevers  TEE done and negative for endocarditis  On vancomycin plan per ID   LOS: 4 days   [name] 06/12/2013, 8:39 AM

## 2013-06-12 NOTE — Discharge Summary (Addendum)
[facility]                                                                                   Ernest [name], is a 67 y.o. male  DOB 18-Nov-1945  MRN [id number].  Admission date:  06/08/2013  Discharge Date:  06/12/2013  Primary MD  Louis Meckel, MD  Admitting Physician  [name], MD  Admission Diagnosis  Coronary atherosclerosis of unspecified type of vessel, native or graft [414.00] Acute respiratory failure [518.81] End stage renal disease [585.6] Hyperkalemia [276.7] Pulmonary edema [514] Dyspnea [786.09] Hypoglycemia [251.2] ESRD (end stage renal disease) [585.6] Elevated troponin [790.6] NSTEMI (non-ST elevated myocardial infarction) [410.70] Diabetes mellitus [250.00]  Discharge Diagnosis     Active Problems:   Mixed hyperlipidemia   CAD   CKD (chronic kidney disease) stage V requiring chronic dialysis   Acute respiratory failure with hypoxia   Elevation of cardiac enzymes   Diabetes mellitus   Volume overload   Acute on chronic systolic CHF (congestive heart failure)   Cardiomyopathy, ischemic      Past Medical History  Diagnosis Date  . Abnormal electrocardiogram   . Hyperlipidemia   . Retinopathy   . Gout   . ESRD (end stage renal disease)     Started HD in [place] in 2009, ESRD was due to DM. Moved to [facility] in Dec 2009 and now gets dialysis at [facility] on a MWF schedule.     . Type II or unspecified type diabetes mellitus without mention of complication, not stated as uncontrolled     adult onset  . Erectile dysfunction     penile implant  . Abnormal stress test     s/p cath November 2013 with modest disease involving the ostial left main, proximal LAD, proximal RCA - do not appear to be hemodynamically signficant; mild LV dysfunction  . Obesity   . Shortness of breath     with exertion  . Hypertension   . Hepatitis C   . COPD (chronic obstructive pulmonary disease)   . Coronary artery disease     per cath report 2013    Past  Surgical History  Procedure Laterality Date  . Penile prosthesis implant    . Fistula      RUE and wrist  . Cardiac catheterization  August 26, 2012  . Eye surgery      laser.  and surgery for DM  . Insertion of dialysis catheter Right 01/01/2013    Procedure: INSERTION OF DIALYSIS CATHETER right  internal jugular;  Surgeon: Serafina Mitchell, MD;  Location: [place];  Service: Vascular;  Laterality: Right;  . Av fistula placement Left 01/13/2013    Procedure: INSERTION OF ARTERIOVENOUS (AV) GORE-TEX GRAFT ARM;  Surgeon: Angelia Mould, MD;  Location: [place];  Service: Vascular;  Laterality: Left;  . Thrombectomy w/ embolectomy Left 03/26/2013    Procedure: Attempted thrombectomy of left arm arteriovenous goretex graft.;  Surgeon: Angelia Mould, MD;  Location: [place];  Service: Vascular;  Laterality: Left;  . Insertion of dialysis catheter N/A 03/26/2013    Procedure: INSERTION OF DIALYSIS CATHETER Left internal jugular vein;  Surgeon: Harrell Gave  Nicole Cella, MD;  Location: [place];  Service: Vascular;  Laterality: N/A;  . Avgg removal Left 03/26/2013    Procedure: REMOVAL OF  NON INCORPORATED ARTERIOVENOUS GORETEX GRAFT ([place]) left arm * repair of  left brachial artery with vein patch angioplasty.;  Surgeon: Angelia Mould, MD;  Location: [place];  Service: Vascular;  Laterality: Left;  . Av fistula placement Left 05/05/2013    Procedure: INSERTION OF LEFT UPPER ARM  ARTERIOVENOUS GORTEX GRAFT;  Surgeon: Angelia Mould, MD;  Location: [place];  Service: Vascular;  Laterality: Left;  . Tee without cardioversion N/A 06/11/2013    Procedure: TRANSESOPHAGEAL ECHOCARDIOGRAM (TEE);  Surgeon: Larey Dresser, MD;  Location: [place];  Service: Cardiovascular;  Laterality: N/A;     Recommendations for primary care physician for things to follow:   Follow with ID closely   Discharge Diagnoses:   Active Problems:   Mixed hyperlipidemia   CAD   CKD (chronic kidney disease) stage  V requiring chronic dialysis   Acute respiratory failure with hypoxia   Elevation of cardiac enzymes   Diabetes mellitus   Volume overload   Acute on chronic systolic CHF (congestive heart failure)   Cardiomyopathy, ischemic    Discharge Condition: Stable   Diet recommendation: See Discharge Instructions below   Consults ID,Renal,Cards    History of present illness and  Hospital Course:     Kindly see H&P for history of present illness and admission details, please review complete Labs, Consult reports and Test reports for all details in brief Ernest [name], is a 67 y.o. male, patient with history of ESRD on dialysis,Isch. cardiomyopathy with Chronic systolic heart failure with EF 30%, dyslipidemia, hypertension, Diabetes mellitus 2 on insulin, who presented to the ER with complaints of SOB. He completed his dialysis on Friday, subsequently had increasing shortness of breath for 2 days. Denied any chest pain, cough congestion fevers or chills. His breathing had worsened and he was sent to the ER by EMS. He reported compliance with medications and salt/fluid restriction, denied any PND he reported mild orthopnea, no lower extremity edema.   Upon evaluation in the ER he was noted to have acute respiratory failure/pulmonary edema requiring BiPAP, hyperkalemia and elevated troponin. He is been dialyzed for fluid removal by nephrology, cardiology saw the patient for medical management of his CHF and underlying history of CAD.        Acute respiratory failure with hypoxia/Volume overload due to Acute on chronic combined systolic and diastolic heart in a patient with ESRD on Monday Wednesday Friday dialysis.   Back to baseline and at his dry weight after multiple liters of fluid was removed through dialysis. He is currently on Coreg aspirin, Plavix along with statin. Tolerating his home dose ARB, seen by renal and cardiology, he will follow with both post discharge.     Elevation of  cardiac enzymes/CAD This was likely not ACS but secondary to his underlying renal dysfunction  -last cath 2013 which showed 50% eccentric ostial left main stenosis, 50-70% stenosis in prox LAD, 20% ramus and 50% proximal RCA and medical management was recommended.-Was seen by cardiology her extensively in multiple times he reviewed his clinical scenario including his TEE and PT and recommend to continue on present medical care. He is on full medical therapy which includes Plavix, beta blocker, ARB,statin no Aspirinwas recommended by cardiology.      Acute systolic heart failure/Grade 1 DD  -as above/primary tx will be HD and meds  ECHO demonstrates significant decrease in EF from 60% to 30-35% with grade 1 DD- also with new WMA in septum   Seen by cardiology recommended to continue above dictated medical therapy only at this time.     ESRD on dialysis Monday Wednesday Friday  Follow with Renal Monday Wednesday Friday dialysis.     Diabetes mellitus  -resume home insulin regimen    Lab Results   Component  Value  Date    HGBA1C  6.6*  06/10/2013    CBG (last 3)   Recent Labs  06/11/13 1747 06/11/13 2149 06/12/13 1304  GLUCAP 63* 166* 140*      Abnormal blood cx's  2 out of 2 blood cultures positive for coag-negative staph, patient did have a dialysis catheter in his groin which was removed on 06/10/2013, He was seen by ID here, surveillance blood cultures have been redrawn and are negative to date, Surveillance TEE And TTE were done both are unremarkable and do not show any signs of endocarditis, likely source of infection could have been the groin dialysis catheter Which has been removed, patient will be placed on 2 weeks of IV vancomycin with dialysis, renal has been informed they will arrange for the same. Patient will also followup with ID post discharge.  Addendum Dr hatcher added rifampin before DC.    HTN  -cont BB and ARB    Mixed hyperlipidemia  -cont on  home dose statin   Suspected OHS/SA  -needs outpt sleep study as he almost assuredly has OHS/SA          Today   Subjective:   Ernest [name] today has no headache,no chest abdominal pain,no new weakness tingling or numbness, feels much better wants to go home today.    Objective:   Blood pressure 134/39, pulse 94, temperature 97.7 F (36.5 C), temperature source Oral, resp. rate 16, height 5\' 9"  (1.753 m), weight 112 kg (246 lb 14.6 oz), SpO2 94.00%.   Intake/Output Summary (Last 24 hours) at 06/12/13 1745 Last data filed at 06/12/13 1210  Gross per 24 hour  Intake    120 ml  Output   3500 ml  Net  -3380 ml    Exam Awake Alert, Oriented *3, No new F.N deficits, Normal affect [place].AT,PERRAL Supple Neck,No JVD, No cervical lymphadenopathy appriciated.  Symmetrical Chest wall movement, Good air movement bilaterally, CTAB RRR,No Gallops,Rubs or new Murmurs, No Parasternal Heave +ve B.Sounds, Abd Soft, Non tender, No organomegaly appriciated, No rebound -guarding or rigidity. No Cyanosis, Clubbing or edema, No new Rash or bruise  Data Review   Major procedures and Radiology Reports - PLEASE review detailed and final reports for all details, in brief -    TTE  - Left ventricle: The cavity size was normal. Wall thickness was increased in a pattern of mild LVH. Systolic function was moderately to severely reduced. The estimated ejection fraction was in the range of 30% to 35%. There is hypokinesis of the anteroseptal and inferoseptal myocardium. Doppler parameters are consistent with abnormal left ventricular relaxation (grade 1 diastolic dysfunction). - Mitral valve: Calcified annulus. Mildly thickened leaflets   TEE  Findings: Please see echo section of chart for full report. Normal LV size with mild LV hypertrophy. EF 30% with anteroseptal and inferoseptal severe hypokinesis; anterior hypokinesis. Normal RV size and systolic function. Trivial MR. No evidence for  endocarditis.  No complications.     Dg Chest 2 View  06/11/2013   *RADIOLOGY REPORT*  Clinical Data: Shortness of  breath.  Follow up of pulmonary edema.  CHEST - 2 VIEW  Comparison: 06/09/13.  Findings: Removal of dialysis catheter. Cardiomegaly accentuated by AP portable technique.  A small left pleural effusion is slightly decreased. Cannot exclude minimal loculation laterally.  No pneumothorax.  Interstitial edema is decreased and mild.  Improved bibasilar airspace disease.  Increased density projecting over the left side of the thoracic inlet is indeterminate. Possibly related to degenerative change in costovertebral articulations.  Similar back to 03/01/2013.  IMPRESSION: Improved aeration with decreased left base air space disease and adjacent small left pleural effusion.  Improved congestive heart failure.   Original Report Authenticated By: Abigail Miyamoto, M.D.   Dg Chest 2 View  06/09/2013   *RADIOLOGY REPORT*  Clinical Data: Follow up of pulmonary edema.  Weakness.  Shortness of breath.  CHEST - 2 VIEW  Comparison: 1 day prior  Findings: A dialysis catheter which terminates in the right atrium.  Cardiomegaly, accentuated by low lung volumes on the frontal. Small bilateral pleural effusions, likely similar. No pneumothorax. Density difference over the lateral left hemithorax is favored to be due to a skin fold.  Slight improvement in moderate interstitial edema.  Persistent left greater than right bibasilar airspace disease.  Vascular stent projecting over the right upper lung.  IMPRESSION: Slight improvement in moderate congestive heart failure.  Small bilateral pleural effusions with adjacent airspace disease, likely atelectasis.  The left base air space disease is slightly progressive, and developing infection is difficult to exclude.  Probable skin fold over the lateral left hemithorax.   Original Report Authenticated By: Abigail Miyamoto, M.D.   Dg Chest Portable 1 View  06/08/2013   *RADIOLOGY  REPORT*  Clinical Data: Shortness of breath  PORTABLE CHEST - 1 VIEW  Comparison: 03/26/2013  Findings: Heart size upper normal to mildly enlarged with central vascular congestion.  Interstitial prominence and perihilar increased markings.  Left lung consolidation.  Layering pleural effusions not excluded.  Multilevel degenerative change.  No pneumothorax.  Left sided large bore catheter with tips that project over the cavoatrial junction.  No interval osseous change.  IMPRESSION: Central vascular congestion and pulmonary edema pattern.  Left lung base opacity; atelectasis versus infiltrate.   Original Report Authenticated By: Carlos Levering, M.D.    Micro Results     Recent Results (from the past 240 hour(s))  CULTURE, BLOOD (ROUTINE X 2)     Status: None   Collection Time    06/08/13  3:45 AM      Result Value Range Status   Specimen Description BLOOD RIGHT HAND   Final   Special Requests BOTTLES DRAWN AEROBIC AND ANAEROBIC 5CC EACH   Final   Culture  Setup Time     Final   Value: 06/08/2013 10:57     Performed at [facility]   Culture     Final   Value: STAPHYLOCOCCUS SPECIES (COAGULASE NEGATIVE)     Note: RIFAMPIN AND GENTAMICIN SHOULD NOT BE USED AS SINGLE DRUGS FOR TREATMENT OF STAPH INFECTIONS. This organism DOES NOT demonstrate inducible Clindamycin resistance in vitro.     Note: Gram Stain Report Called to,Read Back By and Verified With: KRISTAL R @ 0825 06/09/13 BY KRAWS     Performed at [facility]   Report Status 06/11/2013 FINAL   Final   Organism ID, Bacteria STAPHYLOCOCCUS SPECIES (COAGULASE NEGATIVE)   Final  CULTURE, BLOOD (ROUTINE X 2)     Status: None   Collection Time  06/08/13  4:30 AM      Result Value Range Status   Specimen Description BLOOD RIGHT HAND   Final   Special Requests BOTTLES DRAWN AEROBIC ONLY 8CC   Final   Culture  Setup Time     Final   Value: 06/08/2013 10:57     Performed at [facility]   Culture     Final    Value: GRAM POSITIVE COCCI IN CLUSTERS     Note: SUSCEPTIBILITIES PERFORMED ON PREVIOUS CULTURE WITHIN THE LAST 5 DAYS.     STAPHYLOCOCCUS SPECIES (COAGULASE NEGATIVE)     Note: Gram Stain Report Called to,Read Back By and Verified With: KRISTAL R @ 1520 06/09/13 BY KRAWS     Performed at [facility]   Report Status 06/11/2013 FINAL   Final  MRSA PCR SCREENING     Status: None   Collection Time    06/08/13  8:16 AM      Result Value Range Status   MRSA by PCR NEGATIVE  NEGATIVE Final   Comment:            The GeneXpert MRSA Assay (FDA     approved for NASAL specimens     only), is one component of a     comprehensive MRSA colonization     surveillance program. It is not     intended to diagnose MRSA     infection nor to guide or     monitor treatment for     MRSA infections.  CULTURE, BLOOD (ROUTINE X 2)     Status: None   Collection Time    06/10/13  4:12 PM      Result Value Range Status   Specimen Description BLOOD RIGHT HAND   Final   Special Requests BOTTLES DRAWN AEROBIC ONLY 3CC   Final   Culture  Setup Time     Final   Value: 06/11/2013 01:55     Performed at [facility]   Culture     Final   Value:        BLOOD CULTURE RECEIVED NO GROWTH TO DATE CULTURE WILL BE HELD FOR 5 DAYS BEFORE ISSUING A FINAL NEGATIVE REPORT     Performed at [facility]   Report Status PENDING   Incomplete  CULTURE, BLOOD (ROUTINE X 2)     Status: None   Collection Time    06/10/13  4:13 PM      Result Value Range Status   Specimen Description BLOOD RIGHT ARM   Final   Special Requests BOTTLES DRAWN AEROBIC ONLY 3CC   Final   Culture  Setup Time     Final   Value: 06/11/2013 01:55     Performed at [facility]   Culture     Final   Value:        BLOOD CULTURE RECEIVED NO GROWTH TO DATE CULTURE WILL BE HELD FOR 5 DAYS BEFORE ISSUING A FINAL NEGATIVE REPORT     Performed at [facility]   Report Status PENDING   Incomplete     CBC w Diff: Lab  Results  Component Value Date   WBC 9.1 06/12/2013   HGB 10.8* 06/12/2013   HCT 31.9* 06/12/2013   PLT 260 06/12/2013   LYMPHOPCT 22 03/01/2013   MONOPCT 7 03/01/2013   EOSPCT 2 03/01/2013   BASOPCT 0 03/01/2013    CMP: Lab Results  Component Value Date   NA 135 06/12/2013  K 5.1 06/12/2013   CL 96 06/12/2013   CO2 26 06/12/2013   BUN 40* 06/12/2013   CREATININE 10.29* 06/12/2013   PROT 6.8 03/01/2013   ALBUMIN 3.0* 06/12/2013   BILITOT 0.2* 03/01/2013   ALKPHOS 53 03/01/2013   AST 20 03/01/2013   ALT 19 03/01/2013  .   Discharge Instructions     Follow with Primary MD Corliss Parish A, MD in 3 days   Get CBC, CMP, checked 3 days by Primary MD and again as instructed by your Primary MD.   Get Medicines reviewed and adjusted.  Please request your Prim.MD to go over all Hospital Tests and Procedure/Radiological results at the follow up, please get all Hospital records sent to your Prim MD by signing hospital release before you go home.  Activity: As tolerated with Full fall precautions use walker/cane & assistance as needed   Diet:  Renal,  Fluid restriction 1.2 lit/day, Aspiration precautions.  For Heart failure patients - Check your Weight same time everyday, if you gain over 2 pounds, or you develop in leg swelling, experience more shortness of breath or chest pain, call your Primary MD immediately. Follow Cardiac Low Salt Diet and 1.2 lit/day fluid restriction.  Disposition Home   If you experience worsening of your admission symptoms, develop shortness of breath, life threatening emergency, suicidal or homicidal thoughts you must seek medical attention immediately by calling 911 or calling your MD immediately  if symptoms less severe.  You Must read complete instructions/literature along with all the possible adverse reactions/side effects for all the Medicines you take and that have been prescribed to you. Take any new Medicines after you have completely understood and accpet  all the possible adverse reactions/side effects.   Do not drive and provide baby sitting services if your were admitted for syncope or siezures until you have seen by Primary MD or a Neurologist and advised to do so again.  Do not drive when taking Pain medications.    Do not take more than prescribed Pain, Sleep and Anxiety Medications  Special Instructions: If you have smoked or chewed Tobacco  in the last 2 yrs please stop smoking, stop any regular Alcohol  and or any Recreational drug use.  Wear Seat belts while driving.   Please note  You were cared for by a hospitalist during your hospital stay. If you have any questions about your discharge medications or the care you received while you were in the hospital after you are discharged, you can call the unit and asked to speak with the hospitalist on call if the hospitalist that took care of you is not available. Once you are discharged, your primary care physician will handle any further medical issues. Please note that NO REFILLS for any discharge medications will be authorized once you are discharged, as it is imperative that you return to your primary care physician (or establish a relationship with a primary care physician if you do not have one) for your aftercare needs so that they can reassess your need for medications and monitor your lab values.        Follow-up Information   Follow up with GOLDSBOROUGH,KELLIE A, MD. Schedule an appointment as soon as possible for a visit in 3 days.   Specialty:  Nephrology   Contact information:   [place] [address] [phone]       Follow up with Alcide Evener, MD. Schedule an appointment as soon as possible for a visit in  1 week.   Specialty:  Infectious Diseases   Contact information:   [address] [place] [address] [phone]       Follow up with Dola Argyle, MD In 1 week.   Specialty:  Cardiology   Contact information:    [id number] [place] [address] [phone]         Discharge Medications     Medication List         albuterol 108 (90 BASE) MCG/ACT inhaler  Commonly known as:  PROVENTIL HFA;VENTOLIN HFA  Inhale 2 puffs into the lungs every 6 (six) hours as needed for wheezing.     allopurinol 100 MG tablet  Commonly known as:  ZYLOPRIM  Take 100 mg by mouth daily.     amitriptyline 25 MG tablet  Commonly known as:  ELAVIL  Take 25 mg by mouth at bedtime.     Calcium Acetate 667 MG Tabs  Take 1 tablet by mouth 3 (three) times daily with meals.     cinacalcet 60 MG tablet  Commonly known as:  SENSIPAR  Take 60 mg by mouth daily.     clopidogrel 75 MG tablet  Commonly known as:  PLAVIX  Take 75 mg by mouth daily.     colchicine 0.6 MG tablet  Take 0.6 mg by mouth daily as needed (for gout flare-ups).     fluticasone 50 MCG/ACT nasal spray  Commonly known as:  FLONASE  Place 2 sprays into the nose at bedtime.     insulin NPH 100 UNIT/ML injection  Commonly known as:  HUMULIN N,NOVOLIN N  Inject 20 Units into the skin 2 (two) times daily.     insulin regular 100 units/mL injection  Commonly known as:  NOVOLIN R,HUMULIN R  Inject 40 Units into the skin 3 (three) times daily before meals.     losartan 50 MG tablet  Commonly known as:  COZAAR  Take 50 mg by mouth daily.     metoprolol 50 MG tablet  Commonly known as:  LOPRESSOR  Take 50 mg by mouth every evening.     multivitamin Tabs tablet  Take 1 tablet by mouth daily.     rifampin 300 MG capsule  Commonly known as:  RIFADIN  Take 1 capsule (300 mg total) by mouth daily.     sevelamer carbonate 800 MG tablet  Commonly known as:  RENVELA  Take 2,400 mg by mouth 3 (three) times daily with meals.     simvastatin 20 MG tablet  Commonly known as:  ZOCOR  Take 20 mg by mouth at bedtime.     vancomycin 1 GM/200ML Soln  Commonly known as:  VANCOCIN  Inject 200 mLs (1,000 mg total) into the  vein every Monday, Wednesday, and Friday with hemodialysis. 2 weeks           Total Time in preparing paper work, data evaluation and todays exam - 35 minutes  [name] M.D on 06/12/2013 at 5:45 PM  [facility] Office  [phone]

## 2013-06-14 DIAGNOSIS — B182 Chronic viral hepatitis C: Secondary | ICD-10-CM | POA: Diagnosis not present

## 2013-06-14 DIAGNOSIS — N186 End stage renal disease: Secondary | ICD-10-CM | POA: Diagnosis not present

## 2013-06-14 DIAGNOSIS — I5023 Acute on chronic systolic (congestive) heart failure: Secondary | ICD-10-CM | POA: Diagnosis not present

## 2013-06-14 DIAGNOSIS — E1129 Type 2 diabetes mellitus with other diabetic kidney complication: Secondary | ICD-10-CM | POA: Diagnosis not present

## 2013-06-14 DIAGNOSIS — N185 Chronic kidney disease, stage 5: Secondary | ICD-10-CM | POA: Diagnosis not present

## 2013-06-14 DIAGNOSIS — E669 Obesity, unspecified: Secondary | ICD-10-CM | POA: Diagnosis not present

## 2013-06-14 DIAGNOSIS — I2510 Atherosclerotic heart disease of native coronary artery without angina pectoris: Secondary | ICD-10-CM | POA: Diagnosis not present

## 2013-06-14 DIAGNOSIS — Z794 Long term (current) use of insulin: Secondary | ICD-10-CM | POA: Diagnosis not present

## 2013-06-14 DIAGNOSIS — Z992 Dependence on renal dialysis: Secondary | ICD-10-CM | POA: Diagnosis not present

## 2013-06-14 DIAGNOSIS — I509 Heart failure, unspecified: Secondary | ICD-10-CM | POA: Diagnosis not present

## 2013-06-15 DIAGNOSIS — Z23 Encounter for immunization: Secondary | ICD-10-CM | POA: Diagnosis not present

## 2013-06-15 DIAGNOSIS — E119 Type 2 diabetes mellitus without complications: Secondary | ICD-10-CM | POA: Diagnosis not present

## 2013-06-15 DIAGNOSIS — N186 End stage renal disease: Secondary | ICD-10-CM | POA: Diagnosis not present

## 2013-06-15 DIAGNOSIS — N2581 Secondary hyperparathyroidism of renal origin: Secondary | ICD-10-CM | POA: Diagnosis not present

## 2013-06-15 DIAGNOSIS — D509 Iron deficiency anemia, unspecified: Secondary | ICD-10-CM | POA: Diagnosis not present

## 2013-06-15 DIAGNOSIS — J811 Chronic pulmonary edema: Secondary | ICD-10-CM | POA: Diagnosis not present

## 2013-06-16 ENCOUNTER — Encounter: Payer: Self-pay | Admitting: Cardiology

## 2013-06-16 DIAGNOSIS — E1169 Type 2 diabetes mellitus with other specified complication: Secondary | ICD-10-CM | POA: Insufficient documentation

## 2013-06-16 DIAGNOSIS — N186 End stage renal disease: Secondary | ICD-10-CM | POA: Insufficient documentation

## 2013-06-16 DIAGNOSIS — R9430 Abnormal result of cardiovascular function study, unspecified: Secondary | ICD-10-CM | POA: Insufficient documentation

## 2013-06-16 DIAGNOSIS — G4733 Obstructive sleep apnea (adult) (pediatric): Secondary | ICD-10-CM | POA: Insufficient documentation

## 2013-06-16 DIAGNOSIS — Z992 Dependence on renal dialysis: Secondary | ICD-10-CM

## 2013-06-16 DIAGNOSIS — E1129 Type 2 diabetes mellitus with other diabetic kidney complication: Secondary | ICD-10-CM | POA: Diagnosis not present

## 2013-06-16 DIAGNOSIS — E1122 Type 2 diabetes mellitus with diabetic chronic kidney disease: Secondary | ICD-10-CM | POA: Insufficient documentation

## 2013-06-16 DIAGNOSIS — I5042 Chronic combined systolic (congestive) and diastolic (congestive) heart failure: Secondary | ICD-10-CM | POA: Insufficient documentation

## 2013-06-16 DIAGNOSIS — B182 Chronic viral hepatitis C: Secondary | ICD-10-CM | POA: Diagnosis not present

## 2013-06-16 DIAGNOSIS — E785 Hyperlipidemia, unspecified: Secondary | ICD-10-CM

## 2013-06-16 DIAGNOSIS — N521 Erectile dysfunction due to diseases classified elsewhere: Secondary | ICD-10-CM

## 2013-06-16 DIAGNOSIS — I120 Hypertensive chronic kidney disease with stage 5 chronic kidney disease or end stage renal disease: Secondary | ICD-10-CM

## 2013-06-16 DIAGNOSIS — I2510 Atherosclerotic heart disease of native coronary artery without angina pectoris: Secondary | ICD-10-CM | POA: Diagnosis not present

## 2013-06-16 DIAGNOSIS — N185 Chronic kidney disease, stage 5: Secondary | ICD-10-CM | POA: Diagnosis not present

## 2013-06-16 DIAGNOSIS — I5022 Chronic systolic (congestive) heart failure: Secondary | ICD-10-CM | POA: Insufficient documentation

## 2013-06-16 DIAGNOSIS — J449 Chronic obstructive pulmonary disease, unspecified: Secondary | ICD-10-CM | POA: Insufficient documentation

## 2013-06-16 DIAGNOSIS — R7881 Bacteremia: Secondary | ICD-10-CM | POA: Insufficient documentation

## 2013-06-16 DIAGNOSIS — B1920 Unspecified viral hepatitis C without hepatic coma: Secondary | ICD-10-CM | POA: Insufficient documentation

## 2013-06-16 DIAGNOSIS — E669 Obesity, unspecified: Secondary | ICD-10-CM | POA: Insufficient documentation

## 2013-06-16 DIAGNOSIS — I509 Heart failure, unspecified: Secondary | ICD-10-CM | POA: Diagnosis not present

## 2013-06-16 DIAGNOSIS — I5023 Acute on chronic systolic (congestive) heart failure: Secondary | ICD-10-CM | POA: Diagnosis not present

## 2013-06-16 LAB — HIV-1 RNA QUANT-NO REFLEX-BLD: HIV-1 RNA Quant, Log: <1.3 {Log} (ref <1.30)

## 2013-06-17 DIAGNOSIS — J9600 Acute respiratory failure, unspecified whether with hypoxia or hypercapnia: Secondary | ICD-10-CM | POA: Diagnosis not present

## 2013-06-17 LAB — CULTURE, BLOOD (ROUTINE X 2): Culture: NO GROWTH

## 2013-06-18 ENCOUNTER — Ambulatory Visit (INDEPENDENT_AMBULATORY_CARE_PROVIDER_SITE_OTHER): Payer: Medicare Other | Admitting: Cardiology

## 2013-06-18 ENCOUNTER — Encounter: Payer: Self-pay | Admitting: Cardiology

## 2013-06-18 VITALS — BP 112/48 | HR 83 | Ht 69.0 in | Wt 250.1 lb

## 2013-06-18 DIAGNOSIS — N186 End stage renal disease: Secondary | ICD-10-CM

## 2013-06-18 DIAGNOSIS — I2510 Atherosclerotic heart disease of native coronary artery without angina pectoris: Secondary | ICD-10-CM | POA: Diagnosis not present

## 2013-06-18 DIAGNOSIS — R7881 Bacteremia: Secondary | ICD-10-CM | POA: Diagnosis not present

## 2013-06-18 DIAGNOSIS — I2589 Other forms of chronic ischemic heart disease: Secondary | ICD-10-CM | POA: Diagnosis not present

## 2013-06-18 DIAGNOSIS — I5022 Chronic systolic (congestive) heart failure: Secondary | ICD-10-CM

## 2013-06-18 DIAGNOSIS — I10 Essential (primary) hypertension: Secondary | ICD-10-CM

## 2013-06-18 DIAGNOSIS — I255 Ischemic cardiomyopathy: Secondary | ICD-10-CM

## 2013-06-18 DIAGNOSIS — I509 Heart failure, unspecified: Secondary | ICD-10-CM

## 2013-06-18 LAB — CULTURE, BLOOD (ROUTINE X 2): Culture: NO GROWTH

## 2013-06-18 MED ORDER — ASPIRIN EC 81 MG PO TBEC: 81.0000 mg | DELAYED_RELEASE_TABLET | Freq: Every day | ORAL | Status: DC

## 2013-06-18 NOTE — Assessment & Plan Note (Signed)
The patient's ejection fraction during the recent hospitalization was decreased from a prior study. This raises the question as to whether he should be assessed further again. His catheterization was last year. I have not made plans for more aggressive evaluation today. This can be considered over time.

## 2013-06-18 NOTE — Assessment & Plan Note (Signed)
He is being dialyzed regularly.

## 2013-06-18 NOTE — Assessment & Plan Note (Addendum)
Coronary disease appears to be stable. However his cath was done in November, 2013. He did not have any tight stenoses at that time. Ejection fraction during this last visit seem decreased. It may be most prudent to follow him over time and then repeat an echo before any further consideration. He is on the medications that he can tolerate.  It is of note that in the past he has been on both aspirin and Plavix. Currently he is not on aspirin. With recent events I feel that it is appropriate for him to be on 81 mg of aspirin along with his Plavix. This is recommended to him.

## 2013-06-18 NOTE — Patient Instructions (Addendum)
Your physician has recommended you make the following change in your medication: start taking Aspirin 81 mg daily (you will continue to take Plavix also)  Your physician recommends that you schedule a follow-up appointment in: 8 weeks

## 2013-06-18 NOTE — Assessment & Plan Note (Signed)
During his recent hospitalization he had 2 positive blood cultures for coag-negative staph. It was felt that this was probably due to his dialysis catheter that was changed. TEE revealed no obvious vegetations.

## 2013-06-18 NOTE — Assessment & Plan Note (Signed)
His volume status is now stable with his dialysis. No change in therapy.

## 2013-06-18 NOTE — Progress Notes (Signed)
HPI   The patient is seen for a post hospital visit. He was recently hospitalized with respiratory failure. His echo suggests an ejection fraction that has decreased since his prior evaluation. Cardiac catheterization had been done in the past. Decision was made not to proceed with catheterization during a recent hospitalization in August, 2014. He stabilized. He is here today in the office in followup. He was scheduled to see me post hospital because I had seen him in the hospital. Further review reveals that he has been followed in the past by [name].  [name]  Current Outpatient Prescriptions  Medication Sig Dispense Refill  . albuterol (PROVENTIL HFA;VENTOLIN HFA) 108 (90 BASE) MCG/ACT inhaler Inhale 2 puffs into the lungs every 6 (six) hours as needed for wheezing.      [name] allopurinol (ZYLOPRIM) 100 MG tablet Take 100 mg by mouth daily.       [name] amitriptyline (ELAVIL) 25 MG tablet Take 25 mg by mouth at bedtime.      . Calcium Acetate 667 MG TABS Take 1 tablet by mouth 3 (three) times daily with meals.       . cinacalcet (SENSIPAR) 60 MG tablet Take 60 mg by mouth daily.      . clopidogrel (PLAVIX) 75 MG tablet Take 75 mg by mouth daily.       . colchicine 0.6 MG tablet Take 0.6 mg by mouth daily as needed (for gout flare-ups).       . fluticasone (FLONASE) 50 MCG/ACT nasal spray Place 2 sprays into the nose at bedtime.      . insulin NPH (HUMULIN N,NOVOLIN N) 100 UNIT/ML injection Inject 20 Units into the skin 2 (two) times daily.       . insulin regular (NOVOLIN R,HUMULIN R) 100 units/mL injection Inject 40 Units into the skin 3 (three) times daily before meals.       [name] losartan (COZAAR) 50 MG tablet Take 50 mg by mouth daily.      . metoprolol (LOPRESSOR) 50 MG tablet Take 50 mg by mouth every evening.       . multivitamin (RENA-VIT) TABS tablet Take 1 tablet by mouth daily.        [name] oxyCODONE (OXY IR/ROXICODONE) 5 MG immediate release tablet Take 5 mg by mouth every 6  (six) hours as needed.       . rifampin (RIFADIN) 300 MG capsule Take 1 capsule (300 mg total) by mouth daily.  30 capsule  0  . sevelamer (RENVELA) 800 MG tablet Take 2,400 mg by mouth 3 (three) times daily with meals.       . simvastatin (ZOCOR) 20 MG tablet Take 20 mg by mouth at bedtime.        . vancomycin (VANCOCIN) 1 GM/200ML SOLN Inject 200 mLs (1,000 mg total) into the vein every Monday, Wednesday, and Friday with hemodialysis. 2 weeks  4000 mL     No current facility-administered medications for this visit.    History   Social History  . Marital Status: Married    Spouse Name: N/A    Number of Children: N/A  . Years of Education: N/A   Occupational History  . 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 Smoking status: Former Smoker -- 7 years    Quit date: 06/10/1973  . Smokeless tobacco: Never Used  . Alcohol Use: No  . Drug Use: No  . Sexual Activity: No  Other Topics Concern  . Not on file   Social History Narrative   Adopted mentally retarded child at home, 2 adopted children, 3 living and 2 deceased.    Family History  Problem Relation Age of Onset  . Heart disease Father     Past Medical History  Diagnosis Date  . Hyperlipidemia   . Retinopathy   . Gout   . ESRD (end stage renal disease)     Started HD in [place] in 2009, ESRD was due to DM. Moved to [facility] in Dec 2009 and now gets dialysis at [facility] on a MWF schedule.     . Type II or unspecified type diabetes mellitus without mention of complication, not stated as uncontrolled     adult onset  . Erectile dysfunction     penile implant  . Abnormal stress test     s/p cath November 2013 with modest disease involving the ostial left main, proximal LAD, proximal RCA - do not appear to be hemodynamically signficant; mild LV dysfunction  . Obesity   . Shortness of breath     with exertion  . Hypertension   . Hepatitis C   . COPD (chronic obstructive pulmonary disease)   .  Coronary artery disease     per cath report 2013  . Ejection fraction < 50%   . Chronic systolic CHF (congestive heart failure)   . Bacteremia     Past Surgical History  Procedure Laterality Date  . Penile prosthesis implant    . Fistula      RUE and wrist  . Cardiac catheterization  August 26, 2012  . Eye surgery      laser.  and surgery for DM  . Insertion of dialysis catheter Right 01/01/2013    Procedure: INSERTION OF DIALYSIS CATHETER right  internal jugular;  Surgeon: Serafina Mitchell, MD;  Location: [place];  Service: Vascular;  Laterality: Right;  . Av fistula placement Left 01/13/2013    Procedure: INSERTION OF ARTERIOVENOUS (AV) GORE-TEX GRAFT ARM;  Surgeon: Angelia Mould, MD;  Location: [place];  Service: Vascular;  Laterality: Left;  . Thrombectomy w/ embolectomy Left 03/26/2013    Procedure: Attempted thrombectomy of left arm arteriovenous goretex graft.;  Surgeon: Angelia Mould, MD;  Location: [place];  Service: Vascular;  Laterality: Left;  . Insertion of dialysis catheter N/A 03/26/2013    Procedure: INSERTION OF DIALYSIS CATHETER Left internal jugular vein;  Surgeon: Angelia Mould, MD;  Location: [place];  Service: Vascular;  Laterality: N/A;  . Avgg removal Left 03/26/2013    Procedure: REMOVAL OF  NON INCORPORATED ARTERIOVENOUS GORETEX GRAFT ([place]) left arm * repair of  left brachial artery with vein patch angioplasty.;  Surgeon: Angelia Mould, MD;  Location: [place];  Service: Vascular;  Laterality: Left;  . Av fistula placement Left 05/05/2013    Procedure: INSERTION OF LEFT UPPER ARM  ARTERIOVENOUS GORTEX GRAFT;  Surgeon: Angelia Mould, MD;  Location: [place];  Service: Vascular;  Laterality: Left;  . Tee without cardioversion N/A 06/11/2013    Procedure: TRANSESOPHAGEAL ECHOCARDIOGRAM (TEE);  Surgeon: Larey Dresser, MD;  Location: [place];  Service: Cardiovascular;  Laterality: N/A;    Patient Active Problem List   Diagnosis Date  Noted  . Obstructive sleep apnea 06/16/2013  . Hyperlipidemia   . ESRD (end stage renal disease)   . Erectile dysfunction   . Obesity   . Hypertension   . Hepatitis C   .  COPD (chronic obstructive pulmonary disease)   . Coronary artery disease   . Ejection fraction < 50%   . Chronic systolic CHF (congestive heart failure)   . Bacteremia   . Cardiomyopathy, ischemic 06/10/2013  . Diabetes mellitus 06/08/2013  . Dyspnea 06/12/2011  . AODM 02/28/2009  . GOUT, UNSPECIFIED 02/28/2009  . EXUDATIVE RETINOPATHY 02/28/2009  . CKD (chronic kidney disease) stage V requiring chronic dialysis 02/28/2009    ROS   Patient denies fever, chills, headache, sweats, rash, change in vision, change in hearing, chest pain, cough, nausea vomiting, urinary symptoms. All other systems are reviewed and are negative.  PHYSICAL EXAM  Patient's here with his wife and daughter. He is oriented to person time and place. Affect is normal. He's overweight. Lungs reveal scattered rhonchi. Cardiac exam reveals S1 and S2. The abdomen is protuberant but soft. There is no significant peripheral edema.  Filed Vitals:   06/18/13 1025  BP: 112/48  Pulse: 83  Height: 5\' 9"  (1.753 m)  Weight: 250 lb 1.9 oz (113.454 kg)  SpO2: 97%     ASSESSMENT & PLAN

## 2013-06-18 NOTE — Assessment & Plan Note (Addendum)
Blood pressure is controlled. No change in therapy.  As part of today's evaluation I spent greater than 25 minutes with a total care for the patient. I have reviewed the extensive hospital records and completely updated this record. I've carefully considered multiple different issues. More than half of the 25 minutes was spent with direct contact with the patient and his wife.

## 2013-06-20 DIAGNOSIS — E1129 Type 2 diabetes mellitus with other diabetic kidney complication: Secondary | ICD-10-CM | POA: Diagnosis not present

## 2013-06-20 DIAGNOSIS — N185 Chronic kidney disease, stage 5: Secondary | ICD-10-CM | POA: Diagnosis not present

## 2013-06-20 DIAGNOSIS — I2510 Atherosclerotic heart disease of native coronary artery without angina pectoris: Secondary | ICD-10-CM | POA: Diagnosis not present

## 2013-06-20 DIAGNOSIS — B182 Chronic viral hepatitis C: Secondary | ICD-10-CM | POA: Diagnosis not present

## 2013-06-20 DIAGNOSIS — I5023 Acute on chronic systolic (congestive) heart failure: Secondary | ICD-10-CM | POA: Diagnosis not present

## 2013-06-20 DIAGNOSIS — I509 Heart failure, unspecified: Secondary | ICD-10-CM | POA: Diagnosis not present

## 2013-06-23 DIAGNOSIS — E1129 Type 2 diabetes mellitus with other diabetic kidney complication: Secondary | ICD-10-CM | POA: Diagnosis not present

## 2013-06-23 DIAGNOSIS — I5023 Acute on chronic systolic (congestive) heart failure: Secondary | ICD-10-CM | POA: Diagnosis not present

## 2013-06-23 DIAGNOSIS — I509 Heart failure, unspecified: Secondary | ICD-10-CM | POA: Diagnosis not present

## 2013-06-23 DIAGNOSIS — B182 Chronic viral hepatitis C: Secondary | ICD-10-CM | POA: Diagnosis not present

## 2013-06-23 DIAGNOSIS — N185 Chronic kidney disease, stage 5: Secondary | ICD-10-CM | POA: Diagnosis not present

## 2013-06-23 DIAGNOSIS — I2510 Atherosclerotic heart disease of native coronary artery without angina pectoris: Secondary | ICD-10-CM | POA: Diagnosis not present

## 2013-06-25 DIAGNOSIS — B182 Chronic viral hepatitis C: Secondary | ICD-10-CM | POA: Diagnosis not present

## 2013-06-25 DIAGNOSIS — I5023 Acute on chronic systolic (congestive) heart failure: Secondary | ICD-10-CM | POA: Diagnosis not present

## 2013-06-25 DIAGNOSIS — I2510 Atherosclerotic heart disease of native coronary artery without angina pectoris: Secondary | ICD-10-CM | POA: Diagnosis not present

## 2013-06-25 DIAGNOSIS — E1129 Type 2 diabetes mellitus with other diabetic kidney complication: Secondary | ICD-10-CM | POA: Diagnosis not present

## 2013-06-25 DIAGNOSIS — I509 Heart failure, unspecified: Secondary | ICD-10-CM | POA: Diagnosis not present

## 2013-06-25 DIAGNOSIS — N185 Chronic kidney disease, stage 5: Secondary | ICD-10-CM | POA: Diagnosis not present

## 2013-06-30 DIAGNOSIS — I5023 Acute on chronic systolic (congestive) heart failure: Secondary | ICD-10-CM | POA: Diagnosis not present

## 2013-06-30 DIAGNOSIS — E1129 Type 2 diabetes mellitus with other diabetic kidney complication: Secondary | ICD-10-CM | POA: Diagnosis not present

## 2013-06-30 DIAGNOSIS — I509 Heart failure, unspecified: Secondary | ICD-10-CM | POA: Diagnosis not present

## 2013-06-30 DIAGNOSIS — B182 Chronic viral hepatitis C: Secondary | ICD-10-CM | POA: Diagnosis not present

## 2013-06-30 DIAGNOSIS — I2510 Atherosclerotic heart disease of native coronary artery without angina pectoris: Secondary | ICD-10-CM | POA: Diagnosis not present

## 2013-06-30 DIAGNOSIS — N185 Chronic kidney disease, stage 5: Secondary | ICD-10-CM | POA: Diagnosis not present

## 2013-07-02 DIAGNOSIS — I5023 Acute on chronic systolic (congestive) heart failure: Secondary | ICD-10-CM | POA: Diagnosis not present

## 2013-07-02 DIAGNOSIS — I509 Heart failure, unspecified: Secondary | ICD-10-CM | POA: Diagnosis not present

## 2013-07-02 DIAGNOSIS — I2510 Atherosclerotic heart disease of native coronary artery without angina pectoris: Secondary | ICD-10-CM | POA: Diagnosis not present

## 2013-07-02 DIAGNOSIS — B182 Chronic viral hepatitis C: Secondary | ICD-10-CM | POA: Diagnosis not present

## 2013-07-02 DIAGNOSIS — E1129 Type 2 diabetes mellitus with other diabetic kidney complication: Secondary | ICD-10-CM | POA: Diagnosis not present

## 2013-07-02 DIAGNOSIS — N185 Chronic kidney disease, stage 5: Secondary | ICD-10-CM | POA: Diagnosis not present

## 2013-07-06 ENCOUNTER — Ambulatory Visit (INDEPENDENT_AMBULATORY_CARE_PROVIDER_SITE_OTHER): Payer: Medicare Other | Admitting: Infectious Disease

## 2013-07-06 ENCOUNTER — Encounter: Payer: Self-pay | Admitting: Infectious Disease

## 2013-07-06 VITALS — BP 135/53 | HR 72 | Temp 97.7°F | Ht 69.0 in | Wt 257.0 lb

## 2013-07-06 DIAGNOSIS — N186 End stage renal disease: Secondary | ICD-10-CM | POA: Diagnosis not present

## 2013-07-06 DIAGNOSIS — R7881 Bacteremia: Secondary | ICD-10-CM

## 2013-07-06 DIAGNOSIS — T889XXS Complication of surgical and medical care, unspecified, sequela: Secondary | ICD-10-CM | POA: Diagnosis not present

## 2013-07-06 DIAGNOSIS — B957 Other staphylococcus as the cause of diseases classified elsewhere: Secondary | ICD-10-CM | POA: Diagnosis not present

## 2013-07-06 DIAGNOSIS — T827XXS Infection and inflammatory reaction due to other cardiac and vascular devices, implants and grafts, sequela: Secondary | ICD-10-CM

## 2013-07-06 NOTE — Progress Notes (Signed)
Subjective:    Patient ID: Ernest [name], male    DOB: [birth year], 67 y.o.   MRN: [id number]  HPI  67 year old with ESRD on HD via HD catheter but more recently with LUE AV graft, now admitted with Coag Neg Staph bacteremia, sp removal of IJ line. Repeat blood cultures were obtained post removal of the line and these were negative. Transesophageal echocardiogram was obtained and this was negative for heart valve infection. There was concern about his grafting potentially infected the plan from our standpoint was to get in 4 weeks of IV vancomycin with hemodialysis along with oral rifampin. I do not see rifampin active on the list of medicines that he brings with him today although it is present in Epic. Regardless he has received 4 weeks of vancomycin now today.  He is without fevers chills nausea or malaise. His graft site is not draining any material. We will bring him back in 3 weeks' time and check surveillance cultures.     Review of Systems  Constitutional: Negative for fever, chills, diaphoresis, activity change, appetite change, fatigue and unexpected weight change.  HENT: Negative for congestion, sore throat, rhinorrhea, sneezing, trouble swallowing and sinus pressure.   Eyes: Negative for photophobia and visual disturbance.  Respiratory: Negative for cough, chest tightness, shortness of breath, wheezing and stridor.   Cardiovascular: Negative for chest pain, palpitations and leg swelling.  Gastrointestinal: Negative for nausea, vomiting, abdominal pain, diarrhea, constipation, blood in stool, abdominal distention and anal bleeding.  Genitourinary: Negative for dysuria, hematuria, flank pain and difficulty urinating.  Musculoskeletal: Negative for myalgias, back pain, joint swelling, arthralgias and gait problem.  Skin: Negative for color change, pallor, rash and wound.  Neurological: Negative for dizziness, tremors, weakness and light-headedness.  Hematological: Negative for  adenopathy. Does not bruise/bleed easily.  Psychiatric/Behavioral: Negative for behavioral problems, confusion, sleep disturbance, dysphoric mood, decreased concentration and agitation.       Objective:   Physical Exam  Constitutional: He is oriented to person, place, and time. He appears well-developed and well-nourished. No distress.  HENT:  Head: Normocephalic and atraumatic.  Mouth/Throat: Oropharynx is clear and moist. No oropharyngeal exudate.  Eyes: Conjunctivae and EOM are normal. Pupils are equal, round, and reactive to light. No scleral icterus.  Neck: Normal range of motion. Neck supple. No JVD present.  Cardiovascular: Normal rate, regular rhythm and normal heart sounds.  Exam reveals no gallop and no friction rub.   No murmur heard. Pulmonary/Chest: Effort normal and breath sounds normal. No respiratory distress. He has no wheezes. He has no rales. He exhibits no tenderness.  Abdominal: He exhibits no distension and no mass. There is no tenderness. There is no rebound and no guarding.  Musculoskeletal: He exhibits no edema and no tenderness.  Lymphadenopathy:    He has no cervical adenopathy.  Neurological: He is alert and oriented to person, place, and time. He exhibits normal muscle tone. Coordination normal.  Skin: Skin is warm and dry. He is not diaphoretic. No erythema. No pallor.     Psychiatric: He has a normal mood and affect. His behavior is normal. Judgment and thought content normal.          Assessment & Plan:   COag negative staphylococcal bacteremia presumably due to prior IJ dialysis line that is removed. No endocarditis found on transesophageal echocardiogram. He is receive 4 weeks of vancomycin and possibly also oral rifampin. --He should stop his IV vancomycin and stop his oral rifampin we'll bring him  back to clinic in 3 weeks' time to check his blood cultures for surveillance purposes make sure he is clear this infection.  End-stage renal disease  on hemodialysis Monday Wednesday Friday will schedule him on Tuesday with [facility] for his ID visit

## 2013-07-07 ENCOUNTER — Inpatient Hospital Stay: Payer: Medicare Other | Admitting: Internal Medicine

## 2013-07-07 DIAGNOSIS — E1129 Type 2 diabetes mellitus with other diabetic kidney complication: Secondary | ICD-10-CM | POA: Diagnosis not present

## 2013-07-07 DIAGNOSIS — I2510 Atherosclerotic heart disease of native coronary artery without angina pectoris: Secondary | ICD-10-CM | POA: Diagnosis not present

## 2013-07-07 DIAGNOSIS — I509 Heart failure, unspecified: Secondary | ICD-10-CM | POA: Diagnosis not present

## 2013-07-07 DIAGNOSIS — N185 Chronic kidney disease, stage 5: Secondary | ICD-10-CM | POA: Diagnosis not present

## 2013-07-07 DIAGNOSIS — I5023 Acute on chronic systolic (congestive) heart failure: Secondary | ICD-10-CM | POA: Diagnosis not present

## 2013-07-07 DIAGNOSIS — B182 Chronic viral hepatitis C: Secondary | ICD-10-CM | POA: Diagnosis not present

## 2013-07-09 DIAGNOSIS — N185 Chronic kidney disease, stage 5: Secondary | ICD-10-CM | POA: Diagnosis not present

## 2013-07-09 DIAGNOSIS — I509 Heart failure, unspecified: Secondary | ICD-10-CM | POA: Diagnosis not present

## 2013-07-09 DIAGNOSIS — B182 Chronic viral hepatitis C: Secondary | ICD-10-CM | POA: Diagnosis not present

## 2013-07-09 DIAGNOSIS — I2510 Atherosclerotic heart disease of native coronary artery without angina pectoris: Secondary | ICD-10-CM | POA: Diagnosis not present

## 2013-07-09 DIAGNOSIS — I5023 Acute on chronic systolic (congestive) heart failure: Secondary | ICD-10-CM | POA: Diagnosis not present

## 2013-07-09 DIAGNOSIS — E1129 Type 2 diabetes mellitus with other diabetic kidney complication: Secondary | ICD-10-CM | POA: Diagnosis not present

## 2013-07-14 DIAGNOSIS — N186 End stage renal disease: Secondary | ICD-10-CM | POA: Diagnosis not present

## 2013-07-14 DIAGNOSIS — I2510 Atherosclerotic heart disease of native coronary artery without angina pectoris: Secondary | ICD-10-CM | POA: Diagnosis not present

## 2013-07-14 DIAGNOSIS — B182 Chronic viral hepatitis C: Secondary | ICD-10-CM | POA: Diagnosis not present

## 2013-07-14 DIAGNOSIS — I509 Heart failure, unspecified: Secondary | ICD-10-CM | POA: Diagnosis not present

## 2013-07-14 DIAGNOSIS — E1129 Type 2 diabetes mellitus with other diabetic kidney complication: Secondary | ICD-10-CM | POA: Diagnosis not present

## 2013-07-14 DIAGNOSIS — I5023 Acute on chronic systolic (congestive) heart failure: Secondary | ICD-10-CM | POA: Diagnosis not present

## 2013-07-14 DIAGNOSIS — N185 Chronic kidney disease, stage 5: Secondary | ICD-10-CM | POA: Diagnosis not present

## 2013-07-15 DIAGNOSIS — D509 Iron deficiency anemia, unspecified: Secondary | ICD-10-CM | POA: Diagnosis not present

## 2013-07-15 DIAGNOSIS — N2581 Secondary hyperparathyroidism of renal origin: Secondary | ICD-10-CM | POA: Diagnosis not present

## 2013-07-15 DIAGNOSIS — N186 End stage renal disease: Secondary | ICD-10-CM | POA: Diagnosis not present

## 2013-07-16 DIAGNOSIS — E1129 Type 2 diabetes mellitus with other diabetic kidney complication: Secondary | ICD-10-CM | POA: Diagnosis not present

## 2013-07-16 DIAGNOSIS — B182 Chronic viral hepatitis C: Secondary | ICD-10-CM | POA: Diagnosis not present

## 2013-07-16 DIAGNOSIS — I5023 Acute on chronic systolic (congestive) heart failure: Secondary | ICD-10-CM | POA: Diagnosis not present

## 2013-07-16 DIAGNOSIS — I2510 Atherosclerotic heart disease of native coronary artery without angina pectoris: Secondary | ICD-10-CM | POA: Diagnosis not present

## 2013-07-16 DIAGNOSIS — N185 Chronic kidney disease, stage 5: Secondary | ICD-10-CM | POA: Diagnosis not present

## 2013-07-16 DIAGNOSIS — I509 Heart failure, unspecified: Secondary | ICD-10-CM | POA: Diagnosis not present

## 2013-07-17 DIAGNOSIS — N186 End stage renal disease: Secondary | ICD-10-CM | POA: Diagnosis not present

## 2013-07-17 DIAGNOSIS — N2581 Secondary hyperparathyroidism of renal origin: Secondary | ICD-10-CM | POA: Diagnosis not present

## 2013-07-17 DIAGNOSIS — D509 Iron deficiency anemia, unspecified: Secondary | ICD-10-CM | POA: Diagnosis not present

## 2013-07-20 DIAGNOSIS — N186 End stage renal disease: Secondary | ICD-10-CM | POA: Diagnosis not present

## 2013-07-20 DIAGNOSIS — N2581 Secondary hyperparathyroidism of renal origin: Secondary | ICD-10-CM | POA: Diagnosis not present

## 2013-07-20 DIAGNOSIS — D509 Iron deficiency anemia, unspecified: Secondary | ICD-10-CM | POA: Diagnosis not present

## 2013-07-22 DIAGNOSIS — N2581 Secondary hyperparathyroidism of renal origin: Secondary | ICD-10-CM | POA: Diagnosis not present

## 2013-07-22 DIAGNOSIS — D509 Iron deficiency anemia, unspecified: Secondary | ICD-10-CM | POA: Diagnosis not present

## 2013-07-22 DIAGNOSIS — N186 End stage renal disease: Secondary | ICD-10-CM | POA: Diagnosis not present

## 2013-07-24 DIAGNOSIS — D509 Iron deficiency anemia, unspecified: Secondary | ICD-10-CM | POA: Diagnosis not present

## 2013-07-24 DIAGNOSIS — N2581 Secondary hyperparathyroidism of renal origin: Secondary | ICD-10-CM | POA: Diagnosis not present

## 2013-07-24 DIAGNOSIS — N186 End stage renal disease: Secondary | ICD-10-CM | POA: Diagnosis not present

## 2013-07-25 DIAGNOSIS — I509 Heart failure, unspecified: Secondary | ICD-10-CM | POA: Diagnosis not present

## 2013-07-25 DIAGNOSIS — I2510 Atherosclerotic heart disease of native coronary artery without angina pectoris: Secondary | ICD-10-CM | POA: Diagnosis not present

## 2013-07-25 DIAGNOSIS — B182 Chronic viral hepatitis C: Secondary | ICD-10-CM | POA: Diagnosis not present

## 2013-07-25 DIAGNOSIS — N185 Chronic kidney disease, stage 5: Secondary | ICD-10-CM | POA: Diagnosis not present

## 2013-07-25 DIAGNOSIS — I5023 Acute on chronic systolic (congestive) heart failure: Secondary | ICD-10-CM | POA: Diagnosis not present

## 2013-07-25 DIAGNOSIS — E1129 Type 2 diabetes mellitus with other diabetic kidney complication: Secondary | ICD-10-CM | POA: Diagnosis not present

## 2013-07-27 DIAGNOSIS — N2581 Secondary hyperparathyroidism of renal origin: Secondary | ICD-10-CM | POA: Diagnosis not present

## 2013-07-27 DIAGNOSIS — N186 End stage renal disease: Secondary | ICD-10-CM | POA: Diagnosis not present

## 2013-07-27 DIAGNOSIS — D509 Iron deficiency anemia, unspecified: Secondary | ICD-10-CM | POA: Diagnosis not present

## 2013-07-28 ENCOUNTER — Ambulatory Visit (INDEPENDENT_AMBULATORY_CARE_PROVIDER_SITE_OTHER): Payer: Medicare Other | Admitting: Infectious Diseases

## 2013-07-28 ENCOUNTER — Encounter: Payer: Self-pay | Admitting: Infectious Diseases

## 2013-07-28 VITALS — BP 121/53 | HR 62 | Temp 97.7°F | Ht 69.0 in | Wt 255.0 lb

## 2013-07-28 DIAGNOSIS — I509 Heart failure, unspecified: Secondary | ICD-10-CM | POA: Diagnosis not present

## 2013-07-28 DIAGNOSIS — B182 Chronic viral hepatitis C: Secondary | ICD-10-CM | POA: Diagnosis not present

## 2013-07-28 DIAGNOSIS — I2510 Atherosclerotic heart disease of native coronary artery without angina pectoris: Secondary | ICD-10-CM | POA: Diagnosis not present

## 2013-07-28 DIAGNOSIS — R7881 Bacteremia: Secondary | ICD-10-CM

## 2013-07-28 DIAGNOSIS — E1129 Type 2 diabetes mellitus with other diabetic kidney complication: Secondary | ICD-10-CM | POA: Diagnosis not present

## 2013-07-28 DIAGNOSIS — N185 Chronic kidney disease, stage 5: Secondary | ICD-10-CM | POA: Diagnosis not present

## 2013-07-28 DIAGNOSIS — I5023 Acute on chronic systolic (congestive) heart failure: Secondary | ICD-10-CM | POA: Diagnosis not present

## 2013-07-28 NOTE — Assessment & Plan Note (Signed)
The remaining concern would be that his bacteremia either came fom or seeded his new AVG. Will repeat his BCx today. Will call him if any abnormalities. He appears to be doing well.

## 2013-07-28 NOTE — Progress Notes (Signed)
  Subjective:    Patient ID: Ernest [name], male    DOB: 19-Apr-1946, 67 y.o.   MRN: [id number]  HPI 67 yo M with hx of ESRD,  DM2 (since 1985)  and CNS bacteremia. He was admitted to [facility] with bacteremia, on 06-08-13. His HD catheter was removed. He underwent TEE (-). He wa d/c home on 8-29 and received 1 month of IV vancomycin with HD. He returned to ID clinic on 9-22 and was felt to be doing well enough to stop his vanco.  Has been feeling well. Is off anbx. Completed rifampin last week. No problems with HD, had graft placed in his L arm (August 2014). He has a RUE fistula that was giving him trouble.   Review of Systems  Constitutional: Negative for fever, chills, appetite change and unexpected weight change.  Eyes: Negative for visual disturbance.  Gastrointestinal: Negative for diarrhea and constipation.  Genitourinary: Negative for dysuria.  ophtho endo of 2013?     Objective:   Physical Exam  Constitutional: He appears well-developed and well-nourished.  HENT:  Mouth/Throat: No oropharyngeal exudate.  Eyes: EOM are normal. Pupils are equal, round, and reactive to light.  Neck: Neck supple.  Cardiovascular: Normal rate, regular rhythm and normal heart sounds.   Pulmonary/Chest: Effort normal and breath sounds normal.  Abdominal: Soft. Bowel sounds are normal. There is no tenderness. There is no rebound.  Musculoskeletal:  No increase in heat or skin breakdown, no tenderness LUE fistula. + bruit.  RUE AVG- no bruit.   Lymphadenopathy:    He has no cervical adenopathy.  Neurological:     Skin:  No diabetic foot lesions. + onychomycosis.          Assessment & Plan:

## 2013-07-29 DIAGNOSIS — N186 End stage renal disease: Secondary | ICD-10-CM | POA: Diagnosis not present

## 2013-07-29 DIAGNOSIS — D509 Iron deficiency anemia, unspecified: Secondary | ICD-10-CM | POA: Diagnosis not present

## 2013-07-29 DIAGNOSIS — N2581 Secondary hyperparathyroidism of renal origin: Secondary | ICD-10-CM | POA: Diagnosis not present

## 2013-07-31 DIAGNOSIS — D509 Iron deficiency anemia, unspecified: Secondary | ICD-10-CM | POA: Diagnosis not present

## 2013-07-31 DIAGNOSIS — N186 End stage renal disease: Secondary | ICD-10-CM | POA: Diagnosis not present

## 2013-07-31 DIAGNOSIS — N2581 Secondary hyperparathyroidism of renal origin: Secondary | ICD-10-CM | POA: Diagnosis not present

## 2013-08-03 DIAGNOSIS — D509 Iron deficiency anemia, unspecified: Secondary | ICD-10-CM | POA: Diagnosis not present

## 2013-08-03 DIAGNOSIS — N186 End stage renal disease: Secondary | ICD-10-CM | POA: Diagnosis not present

## 2013-08-03 DIAGNOSIS — N2581 Secondary hyperparathyroidism of renal origin: Secondary | ICD-10-CM | POA: Diagnosis not present

## 2013-08-05 DIAGNOSIS — D509 Iron deficiency anemia, unspecified: Secondary | ICD-10-CM | POA: Diagnosis not present

## 2013-08-05 DIAGNOSIS — N186 End stage renal disease: Secondary | ICD-10-CM | POA: Diagnosis not present

## 2013-08-05 DIAGNOSIS — R972 Elevated prostate specific antigen [PSA]: Secondary | ICD-10-CM | POA: Diagnosis not present

## 2013-08-05 DIAGNOSIS — E1129 Type 2 diabetes mellitus with other diabetic kidney complication: Secondary | ICD-10-CM | POA: Diagnosis not present

## 2013-08-05 DIAGNOSIS — N2581 Secondary hyperparathyroidism of renal origin: Secondary | ICD-10-CM | POA: Diagnosis not present

## 2013-08-05 DIAGNOSIS — Z125 Encounter for screening for malignant neoplasm of prostate: Secondary | ICD-10-CM | POA: Diagnosis not present

## 2013-08-07 DIAGNOSIS — D509 Iron deficiency anemia, unspecified: Secondary | ICD-10-CM | POA: Diagnosis not present

## 2013-08-07 DIAGNOSIS — N2581 Secondary hyperparathyroidism of renal origin: Secondary | ICD-10-CM | POA: Diagnosis not present

## 2013-08-07 DIAGNOSIS — N186 End stage renal disease: Secondary | ICD-10-CM | POA: Diagnosis not present

## 2013-08-10 DIAGNOSIS — D509 Iron deficiency anemia, unspecified: Secondary | ICD-10-CM | POA: Diagnosis not present

## 2013-08-10 DIAGNOSIS — N186 End stage renal disease: Secondary | ICD-10-CM | POA: Diagnosis not present

## 2013-08-10 DIAGNOSIS — N2581 Secondary hyperparathyroidism of renal origin: Secondary | ICD-10-CM | POA: Diagnosis not present

## 2013-08-11 ENCOUNTER — Ambulatory Visit (INDEPENDENT_AMBULATORY_CARE_PROVIDER_SITE_OTHER): Payer: Medicare Other | Admitting: Nurse Practitioner

## 2013-08-11 ENCOUNTER — Encounter: Payer: Self-pay | Admitting: Nurse Practitioner

## 2013-08-11 VITALS — BP 180/70 | HR 88 | Ht 69.0 in | Wt 254.9 lb

## 2013-08-11 DIAGNOSIS — I2510 Atherosclerotic heart disease of native coronary artery without angina pectoris: Secondary | ICD-10-CM | POA: Diagnosis not present

## 2013-08-11 DIAGNOSIS — I509 Heart failure, unspecified: Secondary | ICD-10-CM | POA: Diagnosis not present

## 2013-08-11 DIAGNOSIS — N185 Chronic kidney disease, stage 5: Secondary | ICD-10-CM | POA: Diagnosis not present

## 2013-08-11 DIAGNOSIS — I5023 Acute on chronic systolic (congestive) heart failure: Secondary | ICD-10-CM | POA: Diagnosis not present

## 2013-08-11 DIAGNOSIS — Z1211 Encounter for screening for malignant neoplasm of colon: Secondary | ICD-10-CM | POA: Diagnosis not present

## 2013-08-11 DIAGNOSIS — B182 Chronic viral hepatitis C: Secondary | ICD-10-CM | POA: Diagnosis not present

## 2013-08-11 DIAGNOSIS — E1129 Type 2 diabetes mellitus with other diabetic kidney complication: Secondary | ICD-10-CM | POA: Diagnosis not present

## 2013-08-11 MED ORDER — MOVIPREP 100 G PO SOLR: 1.0000 | Freq: Once | ORAL | Status: DC

## 2013-08-11 NOTE — Progress Notes (Addendum)
     History of Present Illness:  Patient is a 67 year old male with multiple medical problems, on multiple medications including Plavix. He was seen by Dr. Ardis Hughs January 2012 to discuss getting a colonoscopy in preparation for renal transplant. Patient reported a colonoscopy in [place] 2-3 years prior, we were supposed to obtain records but I cannot ascertain whether we received them. Patient is back today to again discuss colonoscopy. He hasn't been able to get listed on transplant list in [place] so he is trying to get relisted in [place] though he will continue to reside in [place]. Patient has no GI complaints. No blood in stool. BMs are normal. No abdominal pain.   Current Medications, Allergies, Past Medical History, Past Surgical History, Family History and Social History were reviewed in [employer] record.  Physical Exam: General: Pleasant, well developed , black male in no acute distress Head: Normocephalic and atraumatic Eyes:  sclerae anicteric, conjunctiva pink  Ears: Normal auditory acuity Lungs: Clear throughout to auscultation Heart: Regular rate and rhythm Abdomen: Soft, non distended, non-tender. No masses, no hepatomegaly. Normal bowel sounds Musculoskeletal: Symmetrical with no gross deformities  Extremities: No edema  Neurological: Alert oriented x 4, grossly nonfocal Psychological:  Alert and cooperative. Normal mood and affect  Assessment and Recommendations:  66. 67 year old male with multiple medical problems, on multiple medications including Plavix. His has ESRD, on hemodialysis  2. Colon cancer screening. Patient trying to get relisted on renal transplant list in [place]. He is referred for colonoscopy. We saw patient in 2012 for colonoscopy and requested records from [place] at that time but I don't think records were ever obtained. It has been close to 5 years since colonoscopy in [place]. We will re-request records  from [place]. Depending on what his last colonoscopy shows patient may not need, or even be eligible for repeat colonoscopy yet. We will let him know. We will need to hold Plavix for procedure. Our office will contact the prescribing provider of those meds. Colonoscopy will need to be done at [facility] as patient's last echocardiogram revealed an EF of only 30%.          I agree with the above GI note.   Owens Loffler, MD [facility] 08/11/2013, 2:51 PM

## 2013-08-11 NOTE — Patient Instructions (Signed)
You have been scheduled for a colonoscopy with propofol. Please follow written instructions given to you at your visit today.  Please pick up your prep kit at the pharmacy within the next 1-3 days. If you use inhalers (even only as needed), please bring them with you on the day of your procedure.  

## 2013-08-12 ENCOUNTER — Telehealth: Payer: Self-pay

## 2013-08-12 DIAGNOSIS — D509 Iron deficiency anemia, unspecified: Secondary | ICD-10-CM | POA: Diagnosis not present

## 2013-08-12 DIAGNOSIS — N186 End stage renal disease: Secondary | ICD-10-CM | POA: Diagnosis not present

## 2013-08-12 DIAGNOSIS — N2581 Secondary hyperparathyroidism of renal origin: Secondary | ICD-10-CM | POA: Diagnosis not present

## 2013-08-12 NOTE — Telephone Encounter (Signed)
Calling to let pt know we had to cancel his colonoscopy due to his EF.  No answer, no voicemail

## 2013-08-14 ENCOUNTER — Ambulatory Visit: Payer: Medicare Other | Admitting: Cardiovascular Disease

## 2013-08-14 DIAGNOSIS — N186 End stage renal disease: Secondary | ICD-10-CM | POA: Diagnosis not present

## 2013-08-14 DIAGNOSIS — N2581 Secondary hyperparathyroidism of renal origin: Secondary | ICD-10-CM | POA: Diagnosis not present

## 2013-08-14 DIAGNOSIS — D509 Iron deficiency anemia, unspecified: Secondary | ICD-10-CM | POA: Diagnosis not present

## 2013-08-17 DIAGNOSIS — N186 End stage renal disease: Secondary | ICD-10-CM | POA: Diagnosis not present

## 2013-08-17 DIAGNOSIS — N2581 Secondary hyperparathyroidism of renal origin: Secondary | ICD-10-CM | POA: Diagnosis not present

## 2013-08-17 DIAGNOSIS — D509 Iron deficiency anemia, unspecified: Secondary | ICD-10-CM | POA: Diagnosis not present

## 2013-08-18 ENCOUNTER — Telehealth: Payer: Self-pay | Admitting: Gastroenterology

## 2013-08-18 NOTE — Telephone Encounter (Signed)
Colonoscopy [facility], 05/2008; Dr. Sharmon Revere; done for "pre renal transplant colorectal screening."  Findings: multiple diverticulum throughout the colon. The colon was otherwise normal. No polyp or cancers were described.  Ernest [name], He appears up to date with colon cancer screening.   Can you find a contact person from his transplant center that I can speak with?  If they require another screening examination before he is normally "due" for screening, I am happy to do it but I need to hear from the transplant team directly about this before just committing him to another procedure especially given his plavix use, cardiac LV EF is 30%.

## 2013-08-19 DIAGNOSIS — D509 Iron deficiency anemia, unspecified: Secondary | ICD-10-CM | POA: Diagnosis not present

## 2013-08-19 DIAGNOSIS — N186 End stage renal disease: Secondary | ICD-10-CM | POA: Diagnosis not present

## 2013-08-19 DIAGNOSIS — N2581 Secondary hyperparathyroidism of renal origin: Secondary | ICD-10-CM | POA: Diagnosis not present

## 2013-08-19 NOTE — Telephone Encounter (Signed)
I haven't been able to catch up with Nephrologist (Dr. Moshe Cipro). I left message at dialysis center for her to call us.

## 2013-08-20 NOTE — Telephone Encounter (Signed)
I spoke with Dr. Moshe Cipro yesterday afternoon.  I reviewed colonoscopy from [place] that was about 5 years ago, he is up to date on colon cancer screening.  She is not aware of any increased screening recommendations for patients undergoing transplant evaluation (except that they are up to date on screening) and so we agreed he does not need colonoscopy currently.    Can you please call him. Let him know that he is up to date on screening for colon cancer.  IF transplant team (tranplant surgeon or transplant nephrologist) who plan to list him for new kidney feels he should be screened again, I am happy to discuss with them.

## 2013-08-21 DIAGNOSIS — N2581 Secondary hyperparathyroidism of renal origin: Secondary | ICD-10-CM | POA: Diagnosis not present

## 2013-08-21 DIAGNOSIS — D509 Iron deficiency anemia, unspecified: Secondary | ICD-10-CM | POA: Diagnosis not present

## 2013-08-21 DIAGNOSIS — N186 End stage renal disease: Secondary | ICD-10-CM | POA: Diagnosis not present

## 2013-08-24 DIAGNOSIS — D509 Iron deficiency anemia, unspecified: Secondary | ICD-10-CM | POA: Diagnosis not present

## 2013-08-24 DIAGNOSIS — N2581 Secondary hyperparathyroidism of renal origin: Secondary | ICD-10-CM | POA: Diagnosis not present

## 2013-08-24 DIAGNOSIS — N186 End stage renal disease: Secondary | ICD-10-CM | POA: Diagnosis not present

## 2013-08-25 ENCOUNTER — Encounter: Payer: Self-pay | Admitting: Cardiovascular Disease

## 2013-08-26 ENCOUNTER — Observation Stay (HOSPITAL_COMMUNITY)
Admission: EM | Admit: 2013-08-26 | Discharge: 2013-08-27 | Disposition: A | Discharge status: Home / Self Care | Payer: Medicare Other | Attending: Vascular Surgery | Admitting: Vascular Surgery

## 2013-08-26 DIAGNOSIS — I998 Other disorder of circulatory system: Secondary | ICD-10-CM

## 2013-08-26 DIAGNOSIS — Z992 Dependence on renal dialysis: Secondary | ICD-10-CM | POA: Diagnosis not present

## 2013-08-26 DIAGNOSIS — T82898A Other specified complication of vascular prosthetic devices, implants and grafts, initial encounter: Principal | ICD-10-CM | POA: Insufficient documentation

## 2013-08-26 DIAGNOSIS — B1920 Unspecified viral hepatitis C without hepatic coma: Secondary | ICD-10-CM | POA: Insufficient documentation

## 2013-08-26 DIAGNOSIS — E119 Type 2 diabetes mellitus without complications: Secondary | ICD-10-CM | POA: Diagnosis not present

## 2013-08-26 DIAGNOSIS — Y832 Surgical operation with anastomosis, bypass or graft as the cause of abnormal reaction of the patient, or of later complication, without mention of misadventure at the time of the procedure: Secondary | ICD-10-CM | POA: Insufficient documentation

## 2013-08-26 DIAGNOSIS — E785 Hyperlipidemia, unspecified: Secondary | ICD-10-CM | POA: Diagnosis not present

## 2013-08-26 DIAGNOSIS — Z794 Long term (current) use of insulin: Secondary | ICD-10-CM | POA: Insufficient documentation

## 2013-08-26 DIAGNOSIS — I509 Heart failure, unspecified: Secondary | ICD-10-CM | POA: Insufficient documentation

## 2013-08-26 DIAGNOSIS — I120 Hypertensive chronic kidney disease with stage 5 chronic kidney disease or end stage renal disease: Secondary | ICD-10-CM | POA: Diagnosis not present

## 2013-08-26 DIAGNOSIS — I5022 Chronic systolic (congestive) heart failure: Secondary | ICD-10-CM | POA: Insufficient documentation

## 2013-08-26 DIAGNOSIS — D509 Iron deficiency anemia, unspecified: Secondary | ICD-10-CM | POA: Diagnosis not present

## 2013-08-26 DIAGNOSIS — I742 Embolism and thrombosis of arteries of the upper extremities: Secondary | ICD-10-CM | POA: Diagnosis not present

## 2013-08-26 DIAGNOSIS — N186 End stage renal disease: Secondary | ICD-10-CM | POA: Insufficient documentation

## 2013-08-26 DIAGNOSIS — J449 Chronic obstructive pulmonary disease, unspecified: Secondary | ICD-10-CM | POA: Insufficient documentation

## 2013-08-26 DIAGNOSIS — N2581 Secondary hyperparathyroidism of renal origin: Secondary | ICD-10-CM | POA: Diagnosis not present

## 2013-08-26 DIAGNOSIS — Z87891 Personal history of nicotine dependence: Secondary | ICD-10-CM | POA: Diagnosis not present

## 2013-08-26 DIAGNOSIS — Z7982 Long term (current) use of aspirin: Secondary | ICD-10-CM | POA: Insufficient documentation

## 2013-08-26 DIAGNOSIS — Z7902 Long term (current) use of antithrombotics/antiplatelets: Secondary | ICD-10-CM | POA: Diagnosis not present

## 2013-08-26 DIAGNOSIS — J4489 Other specified chronic obstructive pulmonary disease: Secondary | ICD-10-CM | POA: Insufficient documentation

## 2013-08-26 NOTE — ED Notes (Signed)
Presents with right forearm numbness, paleness and cool to touch after getting blood pressure checked at dialysis today. Bilateral fistulas with the left arm the one in use for dialysis. Pt reports that aftter the blood pressure was taken on the right arm pt no longer felt the thrill in the bottom part of his fistula and his fore arm began to feel numb and hurt. Right forearm pale, cooler to touch than left. Unable to palpate radial pulse. Radial pulse dopplered.

## 2013-08-27 ENCOUNTER — Other Ambulatory Visit (HOSPITAL_COMMUNITY): Payer: Medicare Other

## 2013-08-27 ENCOUNTER — Emergency Department (HOSPITAL_COMMUNITY): Payer: Medicare Other | Admitting: Anesthesiology

## 2013-08-27 ENCOUNTER — Telehealth: Payer: Self-pay | Admitting: Vascular Surgery

## 2013-08-27 ENCOUNTER — Ambulatory Visit: Payer: Medicare Other | Admitting: Vascular Surgery

## 2013-08-27 ENCOUNTER — Other Ambulatory Visit: Payer: Self-pay | Admitting: *Deleted

## 2013-08-27 ENCOUNTER — Encounter (HOSPITAL_COMMUNITY)
Admission: EM | Disposition: A | Discharge status: Home / Self Care | Payer: Self-pay | Source: Home / Self Care | Attending: Emergency Medicine

## 2013-08-27 ENCOUNTER — Encounter (HOSPITAL_COMMUNITY): Payer: Medicare Other | Admitting: Anesthesiology

## 2013-08-27 ENCOUNTER — Encounter (HOSPITAL_COMMUNITY): Payer: Self-pay | Admitting: Anesthesiology

## 2013-08-27 DIAGNOSIS — I742 Embolism and thrombosis of arteries of the upper extremities: Secondary | ICD-10-CM | POA: Diagnosis not present

## 2013-08-27 DIAGNOSIS — I749 Embolism and thrombosis of unspecified artery: Secondary | ICD-10-CM | POA: Diagnosis not present

## 2013-08-27 DIAGNOSIS — N186 End stage renal disease: Secondary | ICD-10-CM | POA: Diagnosis not present

## 2013-08-27 DIAGNOSIS — T82898A Other specified complication of vascular prosthetic devices, implants and grafts, initial encounter: Secondary | ICD-10-CM | POA: Diagnosis not present

## 2013-08-27 DIAGNOSIS — Z48812 Encounter for surgical aftercare following surgery on the circulatory system: Secondary | ICD-10-CM

## 2013-08-27 DIAGNOSIS — I120 Hypertensive chronic kidney disease with stage 5 chronic kidney disease or end stage renal disease: Secondary | ICD-10-CM | POA: Diagnosis not present

## 2013-08-27 DIAGNOSIS — J449 Chronic obstructive pulmonary disease, unspecified: Secondary | ICD-10-CM | POA: Diagnosis not present

## 2013-08-27 DIAGNOSIS — I82629 Acute embolism and thrombosis of deep veins of unspecified upper extremity: Secondary | ICD-10-CM

## 2013-08-27 DIAGNOSIS — I10 Essential (primary) hypertension: Secondary | ICD-10-CM | POA: Diagnosis not present

## 2013-08-27 HISTORY — PX: EMBOLECTOMY: SHX44

## 2013-08-27 LAB — GLUCOSE, CAPILLARY
Glucose-Capillary: 97 mg/dL (ref 70–99)
Glucose-Capillary: 81 mg/dL (ref 70–99)
Glucose-Capillary: 147 mg/dL — ABNORMAL HIGH (ref 70–99)

## 2013-08-27 LAB — CBC
HCT: 34.1 % — ABNORMAL LOW (ref 39.0–52.0)
MCH: 30.1 pg (ref 26.0–34.0)
MCH: 30.6 pg (ref 26.0–34.0)
MCHC: 34.4 g/dL (ref 30.0–36.0)
MCV: 90.9 fL (ref 78.0–100.0)
Platelets: 222 10*3/uL (ref 150–400)
Platelets: 231 10*3/uL (ref 150–400)
RBC: 3.75 MIL/uL — ABNORMAL LOW (ref 4.22–5.81)
RDW: 14.1 % (ref 11.5–15.5)
RDW: 14.1 % (ref 11.5–15.5)

## 2013-08-27 LAB — CREATININE, SERUM
Creatinine, Ser: 8.09 mg/dL — ABNORMAL HIGH (ref 0.50–1.35)
GFR calc non Af Amer: 6 mL/min — ABNORMAL LOW (ref >90)

## 2013-08-27 LAB — POCT I-STAT, CHEM 8
BUN: 35 mg/dL — ABNORMAL HIGH (ref 6–23)
Calcium, Ion: 1.21 mmol/L (ref 1.13–1.30)
Glucose, Bld: 176 mg/dL — ABNORMAL HIGH (ref 70–99)
TCO2: 29 mmol/L (ref 0–100)

## 2013-08-27 SURGERY — EMBOLECTOMY
Anesthesia: Monitor Anesthesia Care | Site: Arm Upper | Laterality: Right | Wound class: Clean

## 2013-08-27 MED ORDER — METOPROLOL TARTRATE 50 MG PO TABS
50.0000 mg | ORAL_TABLET | Freq: Every evening | ORAL | Status: DC
  Filled 2013-08-27: qty 1

## 2013-08-27 MED ORDER — SIMVASTATIN 20 MG PO TABS
20.0000 mg | ORAL_TABLET | Freq: Every day | ORAL | Status: DC
  Filled 2013-08-27: qty 1

## 2013-08-27 MED ORDER — 0.9 % SODIUM CHLORIDE (POUR BTL) OPTIME
TOPICAL | Status: DC | PRN
  Administered 2013-08-27: 1000 mL

## 2013-08-27 MED ORDER — SODIUM CHLORIDE 0.9 % IV SOLN: 250.0000 mL | INTRAVENOUS | Status: DC | PRN

## 2013-08-27 MED ORDER — HYDRALAZINE HCL 20 MG/ML IJ SOLN: 10.0000 mg | INTRAMUSCULAR | Status: DC | PRN

## 2013-08-27 MED ORDER — MIDAZOLAM HCL 5 MG/5ML IJ SOLN
INTRAMUSCULAR | Status: DC | PRN
  Administered 2013-08-27: 1 mg via INTRAVENOUS
  Administered 2013-08-27 (×2): 0.5 mg via INTRAVENOUS

## 2013-08-27 MED ORDER — CEFAZOLIN SODIUM 1-5 GM-% IV SOLN
INTRAVENOUS | Status: AC
  Filled 2013-08-27: qty 50

## 2013-08-27 MED ORDER — FENTANYL CITRATE 0.05 MG/ML IJ SOLN
INTRAMUSCULAR | Status: DC | PRN
  Administered 2013-08-27 (×5): 25 ug via INTRAVENOUS

## 2013-08-27 MED ORDER — INSULIN ASPART 100 UNIT/ML ~~LOC~~ SOLN
0.0000 [IU] | Freq: Three times a day (TID) | SUBCUTANEOUS | Status: DC
  Administered 2013-08-27: 1 [IU] via SUBCUTANEOUS

## 2013-08-27 MED ORDER — RENA-VITE PO TABS
1.0000 | ORAL_TABLET | Freq: Every day | ORAL | Status: DC
  Filled 2013-08-27: qty 1

## 2013-08-27 MED ORDER — LABETALOL HCL 5 MG/ML IV SOLN
10.0000 mg | INTRAVENOUS | Status: DC | PRN
  Filled 2013-08-27: qty 4

## 2013-08-27 MED ORDER — CEFAZOLIN SODIUM-DEXTROSE 2-3 GM-% IV SOLR
INTRAVENOUS | Status: DC | PRN
  Administered 2013-08-27: 2 g via INTRAVENOUS

## 2013-08-27 MED ORDER — SODIUM CHLORIDE 0.9 % IR SOLN
Status: DC | PRN
  Administered 2013-08-27 (×2)

## 2013-08-27 MED ORDER — PHENOL 1.4 % MT LIQD: 1.0000 | OROMUCOSAL | Status: DC | PRN

## 2013-08-27 MED ORDER — SODIUM CHLORIDE 0.9 % IV SOLN
INTRAVENOUS | Status: DC | PRN
  Administered 2013-08-27: 02:00:00 via INTRAVENOUS

## 2013-08-27 MED ORDER — SODIUM CHLORIDE 0.9 % IJ SOLN
3.0000 mL | Freq: Two times a day (BID) | INTRAMUSCULAR | Status: DC
  Administered 2013-08-27: 3 mL via INTRAVENOUS

## 2013-08-27 MED ORDER — ENOXAPARIN SODIUM 30 MG/0.3ML ~~LOC~~ SOLN
30.0000 mg | SUBCUTANEOUS | Status: DC
Start: 2013-08-27 — End: 2013-08-27
  Administered 2013-08-27: 30 mg via SUBCUTANEOUS
  Filled 2013-08-27: qty 0.3

## 2013-08-27 MED ORDER — LIDOCAINE-EPINEPHRINE 1 %-1:100000 IJ SOLN
INTRAMUSCULAR | Status: AC
  Filled 2013-08-27: qty 1

## 2013-08-27 MED ORDER — OXYCODONE HCL 5 MG PO TABS: 5.0000 mg | ORAL_TABLET | ORAL | Status: DC | PRN

## 2013-08-27 MED ORDER — CALCIUM ACETATE 667 MG PO CAPS
667.0000 mg | ORAL_CAPSULE | Freq: Three times a day (TID) | ORAL | Status: DC
  Administered 2013-08-27 (×2): 667 mg via ORAL
  Filled 2013-08-27 (×4): qty 1

## 2013-08-27 MED ORDER — ALBUTEROL SULFATE HFA 108 (90 BASE) MCG/ACT IN AERS
2.0000 | INHALATION_SPRAY | Freq: Four times a day (QID) | RESPIRATORY_TRACT | Status: DC | PRN
  Filled 2013-08-27: qty 6.7

## 2013-08-27 MED ORDER — ONDANSETRON HCL 4 MG/2ML IJ SOLN: 4.0000 mg | Freq: Four times a day (QID) | INTRAMUSCULAR | Status: DC | PRN

## 2013-08-27 MED ORDER — HEPARIN SODIUM (PORCINE) 1000 UNIT/ML IJ SOLN
INTRAMUSCULAR | Status: DC | PRN
  Administered 2013-08-27: 3000 [IU] via INTRAVENOUS

## 2013-08-27 MED ORDER — LIDOCAINE HCL (PF) 1 % IJ SOLN
INTRAMUSCULAR | Status: AC
  Filled 2013-08-27: qty 30

## 2013-08-27 MED ORDER — PROPOFOL INFUSION 10 MG/ML OPTIME
INTRAVENOUS | Status: DC | PRN
  Administered 2013-08-27: 25 ug/kg/min via INTRAVENOUS

## 2013-08-27 MED ORDER — LIDOCAINE-EPINEPHRINE 1 %-1:100000 IJ SOLN
INTRAMUSCULAR | Status: DC | PRN
  Administered 2013-08-27: 12 mL

## 2013-08-27 MED ORDER — SEVELAMER CARBONATE 800 MG PO TABS
800.0000 mg | ORAL_TABLET | Freq: Three times a day (TID) | ORAL | Status: DC
  Administered 2013-08-27 (×2): 800 mg via ORAL
  Filled 2013-08-27 (×4): qty 1

## 2013-08-27 MED ORDER — METOPROLOL TARTRATE 1 MG/ML IV SOLN
2.0000 mg | INTRAVENOUS | Status: DC | PRN
  Filled 2013-08-27: qty 5

## 2013-08-27 MED ORDER — COLCHICINE 0.6 MG PO TABS: 0.6000 mg | ORAL_TABLET | Freq: Every day | ORAL | Status: DC | PRN

## 2013-08-27 MED ORDER — MORPHINE SULFATE 2 MG/ML IJ SOLN: 2.0000 mg | INTRAMUSCULAR | Status: DC | PRN

## 2013-08-27 MED ORDER — CEFAZOLIN SODIUM 1-5 GM-% IV SOLN: INTRAVENOUS | Status: DC | PRN

## 2013-08-27 MED ORDER — CLOPIDOGREL BISULFATE 75 MG PO TABS
75.0000 mg | ORAL_TABLET | Freq: Every day | ORAL | Status: DC
  Administered 2013-08-27: 75 mg via ORAL
  Filled 2013-08-27: qty 1

## 2013-08-27 MED ORDER — CINACALCET HCL 30 MG PO TABS
60.0000 mg | ORAL_TABLET | Freq: Two times a day (BID) | ORAL | Status: DC
  Administered 2013-08-27: 60 mg via ORAL
  Filled 2013-08-27 (×3): qty 2

## 2013-08-27 MED ORDER — LABETALOL HCL 5 MG/ML IV SOLN
INTRAVENOUS | Status: DC | PRN
  Administered 2013-08-27 (×2): 2.5 mg via INTRAVENOUS

## 2013-08-27 MED ORDER — ONDANSETRON HCL 4 MG/2ML IJ SOLN
INTRAMUSCULAR | Status: DC | PRN
  Administered 2013-08-27: 4 mg via INTRAVENOUS

## 2013-08-27 MED ORDER — ASPIRIN EC 81 MG PO TBEC
81.0000 mg | DELAYED_RELEASE_TABLET | Freq: Every day | ORAL | Status: DC
  Administered 2013-08-27: 81 mg via ORAL
  Filled 2013-08-27: qty 1

## 2013-08-27 MED ORDER — ALLOPURINOL 100 MG PO TABS
100.0000 mg | ORAL_TABLET | Freq: Every day | ORAL | Status: DC
  Administered 2013-08-27: 100 mg via ORAL
  Filled 2013-08-27: qty 1

## 2013-08-27 MED ORDER — LOSARTAN POTASSIUM 50 MG PO TABS
50.0000 mg | ORAL_TABLET | Freq: Every day | ORAL | Status: DC
  Administered 2013-08-27: 50 mg via ORAL
  Filled 2013-08-27: qty 1

## 2013-08-27 MED ORDER — AMITRIPTYLINE HCL 25 MG PO TABS
25.0000 mg | ORAL_TABLET | Freq: Every day | ORAL | Status: DC
  Filled 2013-08-27: qty 1

## 2013-08-27 MED ORDER — CALCIUM ACETATE 667 MG PO TABS: 1.0000 | ORAL_TABLET | Freq: Three times a day (TID) | ORAL | Status: DC

## 2013-08-27 MED ORDER — SODIUM CHLORIDE 0.9 % IJ SOLN: 3.0000 mL | INTRAMUSCULAR | Status: DC | PRN

## 2013-08-27 MED ORDER — GUAIFENESIN-DM 100-10 MG/5ML PO SYRP
15.0000 mL | ORAL_SOLUTION | ORAL | Status: DC | PRN
  Filled 2013-08-27: qty 15

## 2013-08-27 MED ORDER — INSULIN NPH (HUMAN) (ISOPHANE) 100 UNIT/ML ~~LOC~~ SUSP
20.0000 [IU] | Freq: Two times a day (BID) | SUBCUTANEOUS | Status: DC
  Filled 2013-08-27: qty 10

## 2013-08-27 SURGICAL SUPPLY — 66 items
BANDAGE ESMARK 6X9 LF (GAUZE/BANDAGES/DRESSINGS) IMPLANT
BNDG ESMARK 6X9 LF (GAUZE/BANDAGES/DRESSINGS)
CANISTER SUCTION 2500CC (MISCELLANEOUS) ×2 IMPLANT
CATH EMB 2FR 60CM (CATHETERS) ×4 IMPLANT
CATH EMB 3FR 40CM (CATHETERS) ×2 IMPLANT
CATH EMB 3FR 80CM (CATHETERS) ×2 IMPLANT
CATH EMB 4FR 80CM (CATHETERS) ×2 IMPLANT
CATH EMB 5FR 80CM (CATHETERS) IMPLANT
CLIP TI MEDIUM 24 (CLIP) ×4 IMPLANT
CLIP TI WIDE RED SMALL 24 (CLIP) ×4 IMPLANT
COVER SURGICAL LIGHT HANDLE (MISCELLANEOUS) ×2 IMPLANT
CUFF TOURNIQUET SINGLE 18IN (TOURNIQUET CUFF) IMPLANT
CUFF TOURNIQUET SINGLE 24IN (TOURNIQUET CUFF) IMPLANT
CUFF TOURNIQUET SINGLE 34IN LL (TOURNIQUET CUFF) IMPLANT
CUFF TOURNIQUET SINGLE 44IN (TOURNIQUET CUFF) IMPLANT
DECANTER SPIKE VIAL GLASS SM (MISCELLANEOUS) ×2 IMPLANT
DERMABOND ADHESIVE PROPEN (GAUZE/BANDAGES/DRESSINGS) ×1
DERMABOND ADVANCED (GAUZE/BANDAGES/DRESSINGS) ×1
DERMABOND ADVANCED .7 DNX12 (GAUZE/BANDAGES/DRESSINGS) ×1 IMPLANT
DERMABOND ADVANCED .7 DNX6 (GAUZE/BANDAGES/DRESSINGS) ×1 IMPLANT
DRAIN SNY 10X20 3/4 PERF (WOUND CARE) IMPLANT
DRAPE ORTHO SPLIT 77X108 STRL (DRAPES) ×1
DRAPE SURG ORHT 6 SPLT 77X108 (DRAPES) ×1 IMPLANT
DRAPE WARM FLUID 44X44 (DRAPE) ×2 IMPLANT
DRAPE X-RAY CASS 24X20 (DRAPES) IMPLANT
ELECT CAUTERY BLADE 6.4 (BLADE) ×2 IMPLANT
ELECT REM PT RETURN 9FT ADLT (ELECTROSURGICAL) ×2
ELECTRODE REM PT RTRN 9FT ADLT (ELECTROSURGICAL) ×1 IMPLANT
EVACUATOR SILICONE 100CC (DRAIN) IMPLANT
GAUZE SPONGE 4X4 16PLY XRAY LF (GAUZE/BANDAGES/DRESSINGS) ×2 IMPLANT
GLOVE BIOGEL PI IND STRL 6 (GLOVE) ×1 IMPLANT
GLOVE BIOGEL PI IND STRL 7.0 (GLOVE) ×1 IMPLANT
GLOVE BIOGEL PI IND STRL 7.5 (GLOVE) ×1 IMPLANT
GLOVE BIOGEL PI INDICATOR 6 (GLOVE) ×1
GLOVE BIOGEL PI INDICATOR 7.0 (GLOVE) ×1
GLOVE BIOGEL PI INDICATOR 7.5 (GLOVE) ×1
GLOVE SS BIOGEL STRL SZ 6.5 (GLOVE) ×1 IMPLANT
GLOVE SS BIOGEL STRL SZ 7 (GLOVE) ×2 IMPLANT
GLOVE SUPERSENSE BIOGEL SZ 6.5 (GLOVE) ×1
GLOVE SUPERSENSE BIOGEL SZ 7 (GLOVE) ×2
GLOVE SURG SS PI 7.5 STRL IVOR (GLOVE) ×2 IMPLANT
GOWN STRL NON-REIN LRG LVL3 (GOWN DISPOSABLE) ×6 IMPLANT
KIT BASIN OR (CUSTOM PROCEDURE TRAY) ×2 IMPLANT
KIT ROOM TURNOVER OR (KITS) ×2 IMPLANT
NS IRRIG 1000ML POUR BTL (IV SOLUTION) ×4 IMPLANT
PACK PERIPHERAL VASCULAR (CUSTOM PROCEDURE TRAY) ×2 IMPLANT
PAD ARMBOARD 7.5X6 YLW CONV (MISCELLANEOUS) ×4 IMPLANT
PADDING CAST COTTON 6X4 STRL (CAST SUPPLIES) IMPLANT
PENCIL BUTTON HOLSTER BLD 10FT (ELECTRODE) ×2 IMPLANT
SET COLLECT BLD 21X3/4 12 (NEEDLE) IMPLANT
STOCKINETTE 6  STRL (DRAPES) ×1
STOCKINETTE 6 STRL (DRAPES) ×1 IMPLANT
STOPCOCK 4 WAY LG BORE MALE ST (IV SETS) ×2 IMPLANT
SUT PROLENE 5 0 C 1 24 (SUTURE) ×2 IMPLANT
SUT PROLENE 6 0 BV (SUTURE) ×6 IMPLANT
SUT VIC AB 2-0 CTX 36 (SUTURE) ×2 IMPLANT
SUT VIC AB 3-0 SH 27 (SUTURE) ×2
SUT VIC AB 3-0 SH 27X BRD (SUTURE) ×2 IMPLANT
SYR 3ML LL SCALE MARK (SYRINGE) ×4 IMPLANT
SYR TB 1ML LUER SLIP (SYRINGE) ×4 IMPLANT
TOWEL OR 17X24 6PK STRL BLUE (TOWEL DISPOSABLE) ×4 IMPLANT
TOWEL OR 17X26 10 PK STRL BLUE (TOWEL DISPOSABLE) ×4 IMPLANT
TRAY FOLEY CATH 16FRSI W/METER (SET/KITS/TRAYS/PACK) ×2 IMPLANT
TUBING EXTENTION W/L.L. (IV SETS) ×2 IMPLANT
UNDERPAD 30X30 INCONTINENT (UNDERPADS AND DIAPERS) ×2 IMPLANT
WATER STERILE IRR 1000ML POUR (IV SOLUTION) ×2 IMPLANT

## 2013-08-27 NOTE — OR Nursing (Signed)
Elenor Legato RN, gave patients pants and wallet to his wife [name] prior to start of case.

## 2013-08-27 NOTE — Progress Notes (Signed)
Pt orientation to unit, room and routine. Information packet given to patient/family.  Admission INP armband ID verified with patient/family, and in place. SR up x 2, fall risk assessment complete with patient and family verbalizing understanding of risks associated with falls. Pt verbalizes an understanding of how to use the call bell and to call for help before getting out of bed. Telemetry box placed on patient for cardiac monitoring. Admission assessment deferred at this time per pt and family request.   Will cont to monitor and assist as needed.  Velora Mediate, RN 08/27/2013 4:33 AM

## 2013-08-27 NOTE — Progress Notes (Addendum)
VVS: Pt continues to do well. Has some numbness in thumb only on right Palpable Ulnar and radial pulses on right 5-/5 grip- normal for him  Discharge with RTC in 4 weeks

## 2013-08-27 NOTE — Transfer of Care (Signed)
Immediate Anesthesia Transfer of Care Note  Patient: Ernest [name]  Procedure(s) Performed: Procedure(s) with comments: EMBOLECTOMY (Right) - Thrombectomy of Radial and ulnar artery.  Patient Location: PACU  Anesthesia Type:MAC  Level of Consciousness: awake, alert  and oriented  Airway & Oxygen Therapy: Patient Spontanous Breathing  Post-op Assessment: Report given to PACU RN, Post -op Vital signs reviewed and stable and Patient moving all extremities  Post vital signs: Reviewed and stable  Complications: No apparent anesthesia complications

## 2013-08-27 NOTE — Progress Notes (Signed)
UR completed 

## 2013-08-27 NOTE — ED Provider Notes (Signed)
CSN: [id number]     Arrival date & time 08/26/13  2315 History   First MD Initiated Contact with Patient 08/27/13 0001     Chief Complaint  Patient presents with  . Arm Pain   (Consider location/radiation/quality/duration/timing/severity/associated sxs/prior Treatment) HPI History provided by patient. End stage renal disease had scheduled dialysis this morning through his left upper extremity access. He has an old right upper extremity fistula that is no longer used. This morning after dialysis had blood pressure taken in his right arm, afterwards had pain and loss of palpable thrill.  He did not have any palpable distal pulses that time. He was scheduled at that time to see Dr. Oneida Alar the following day.  Tonight at home and developed severe pain in his right forearm and his hand became cold and cyanotic. He has associated numbness. He presents here for evaluation. Pain is moderate in severity and sharp in quality.  Past Medical History  Diagnosis Date  . Hyperlipidemia   . Retinopathy   . Gout   . ESRD (end stage renal disease)     Started HD in [place] in 2009, ESRD was due to DM. Moved to [facility] in Dec 2009 and now gets dialysis at [facility] on a MWF schedule.     . Type II or unspecified type diabetes mellitus without mention of complication, not stated as uncontrolled     adult onset  . Erectile dysfunction     penile implant  . Abnormal stress test     s/p cath November 2013 with modest disease involving the ostial left main, proximal LAD, proximal RCA - do not appear to be hemodynamically signficant; mild LV dysfunction  . Obesity   . Hypertension   . Hepatitis C   . COPD (chronic obstructive pulmonary disease)   . Coronary artery disease     per cath report 2013  . Ejection fraction < 50%   . Chronic systolic CHF (congestive heart failure)   . Bacteremia    Past Surgical History  Procedure Laterality Date  . Penile prosthesis implant    . Fistula      RUE and wrist  .  Cardiac catheterization  August 26, 2012  . Eye surgery      laser.  and surgery for DM  . Insertion of dialysis catheter Right 01/01/2013    Procedure: INSERTION OF DIALYSIS CATHETER right  internal jugular;  Surgeon: Serafina Mitchell, MD;  Location: [place];  Service: Vascular;  Laterality: Right;  . Av fistula placement Left 01/13/2013    Procedure: INSERTION OF ARTERIOVENOUS (AV) GORE-TEX GRAFT ARM;  Surgeon: Angelia Mould, MD;  Location: [place];  Service: Vascular;  Laterality: Left;  . Thrombectomy w/ embolectomy Left 03/26/2013    Procedure: Attempted thrombectomy of left arm arteriovenous goretex graft.;  Surgeon: Angelia Mould, MD;  Location: [place];  Service: Vascular;  Laterality: Left;  . Insertion of dialysis catheter N/A 03/26/2013    Procedure: INSERTION OF DIALYSIS CATHETER Left internal jugular vein;  Surgeon: Angelia Mould, MD;  Location: [place];  Service: Vascular;  Laterality: N/A;  . Avgg removal Left 03/26/2013    Procedure: REMOVAL OF  NON INCORPORATED ARTERIOVENOUS GORETEX GRAFT ([place]) left arm * repair of  left brachial artery with vein patch angioplasty.;  Surgeon: Angelia Mould, MD;  Location: [place];  Service: Vascular;  Laterality: Left;  . Av fistula placement Left 05/05/2013    Procedure: INSERTION OF LEFT UPPER ARM  ARTERIOVENOUS GORTEX GRAFT;  Surgeon: Angelia Mould, MD;  Location: [place];  Service: Vascular;  Laterality: Left;  . Tee without cardioversion N/A 06/11/2013    Procedure: TRANSESOPHAGEAL ECHOCARDIOGRAM (TEE);  Surgeon: Larey Dresser, MD;  Location: [facility] END[facility]COPY;  Service: Cardiovascular;  Laterality: N/A;   Family History  Problem Relation Age of Onset  . Heart disease Father    History  Substance Use Topics  . Smoking status: Former Smoker -- 7 years    Quit date: 06/10/1973  . Smokeless tobacco: Never Used  . Alcohol Use: No    Review of Systems  Constitutional: Negative for fever and chills.  Respiratory:  Negative for shortness of breath.   Cardiovascular: Negative for chest pain.  Gastrointestinal: Negative for abdominal pain.  Genitourinary: Negative for dysuria.  Musculoskeletal: Negative for back pain, neck pain and neck stiffness.  Skin: Positive for pallor.  Neurological: Negative for headaches.  All other systems reviewed and are negative.    Allergies  Review of patient's allergies indicates no known allergies.  Home Medications   Current Outpatient Rx  Name  Route  Sig  Dispense  Refill  . albuterol (PROVENTIL HFA;VENTOLIN HFA) 108 (90 BASE) MCG/ACT inhaler   Inhalation   Inhale 2 puffs into the lungs every 6 (six) hours as needed for wheezing.         [name] allopurinol (ZYLOPRIM) 100 MG tablet   Oral   Take 100 mg by mouth daily.          [name] amitriptyline (ELAVIL) 25 MG tablet   Oral   Take 25 mg by mouth at bedtime.         [name] aspirin EC 81 MG tablet   Oral   Take 1 tablet (81 mg total) by mouth daily.   90 tablet   3   . Calcium Acetate 667 MG TABS   Oral   Take 1 tablet by mouth 3 (three) times daily with meals.          . cinacalcet (SENSIPAR) 60 MG tablet   Oral   Take 60 mg by mouth daily.         . clopidogrel (PLAVIX) 75 MG tablet   Oral   Take 75 mg by mouth daily.          . colchicine 0.6 MG tablet   Oral   Take 0.6 mg by mouth daily as needed (for gout flare-ups).          . fluticasone (FLONASE) 50 MCG/ACT nasal spray   Nasal   Place 2 sprays into the nose at bedtime.         . insulin NPH (HUMULIN N,NOVOLIN N) 100 UNIT/ML injection   Subcutaneous   Inject 20 Units into the skin 2 (two) times daily.          . insulin regular (NOVOLIN R,HUMULIN R) 100 units/mL injection   Subcutaneous   Inject 40 Units into the skin 3 (three) times daily before meals.          [name] losartan (COZAAR) 50 MG tablet   Oral   Take 50 mg by mouth daily.         . metoprolol (LOPRESSOR) 50 MG tablet   Oral   Take 50 mg by mouth every  evening.          [name] MOVIPREP 100 G SOLR   Oral   Take 1 kit (200 g total) by mouth once.   1 kit  0     Dispense as written.   . multivitamin (RENA-VIT) TABS tablet   Oral   Take 1 tablet by mouth daily.           [name] oxyCODONE (OXY IR/ROXICODONE) 5 MG immediate release tablet   Oral   Take 5 mg by mouth every 6 (six) hours as needed.          . sevelamer (RENVELA) 800 MG tablet   Oral   Take 2,400 mg by mouth 3 (three) times daily with meals.          . simvastatin (ZOCOR) 20 MG tablet   Oral   Take 20 mg by mouth at bedtime.           . vancomycin (VANCOCIN) 1 GM/200ML SOLN   Intravenous   Inject 200 mLs (1,000 mg total) into the vein every Monday, Wednesday, and Friday with hemodialysis. 2 weeks   4000 mL       BP 182/86  Pulse 106  Temp(Src) 97.9 F (36.6 C) (Oral)  Resp 22  Wt 256 lb (116.121 kg)  SpO2 96% Physical Exam  Constitutional: He is oriented to person, place, and time. He appears well-developed and well-nourished.  HENT:  Head: Normocephalic and atraumatic.  Eyes: EOM are normal. Pupils are equal, round, and reactive to light.  Neck: Neck supple.  Cardiovascular: Regular rhythm and intact distal pulses.   Pulmonary/Chest: Effort normal. No respiratory distress.  Musculoskeletal:  Right upper extremity with old upper arm fistula in place with palpable pulse there but no thrill. Right hand is cool to the touch versus left. No palpable radial pulse. No areas of erythema or sig and swelling otherwise. Some tenderness in the right forearm Left upper extremity upper arm fistula intact. And left upper extremity distally perfusing.  Neurological: He is alert and oriented to person, place, and time.  Skin: Skin is warm and dry.    ED Course  Procedures (including critical care time) Labs Review Labs Reviewed  CBC - Abnormal; Notable for the following:    RBC 3.75 (*)    Hemoglobin 11.3 (*)    HCT 34.1 (*)    All other components within  normal limits  CBC - Abnormal; Notable for the following:    RBC 3.59 (*)    Hemoglobin 11.0 (*)    HCT 32.0 (*)    All other components within normal limits  CREATININE, SERUM - Abnormal; Notable for the following:    Creatinine, Ser 8.09 (*)    GFR calc non Af Amer 6 (*)    GFR calc Af Amer 7 (*)    All other components within normal limits  GLUCOSE, CAPILLARY - Abnormal; Notable for the following:    Glucose-Capillary 58 (*)    All other components within normal limits  GLUCOSE, CAPILLARY - Abnormal; Notable for the following:    Glucose-Capillary 29 (*)    All other components within normal limits  GLUCOSE, CAPILLARY - Abnormal; Notable for the following:    Glucose-Capillary 147 (*)    All other components within normal limits  POCT I-STAT, CHEM 8 - Abnormal; Notable for the following:    Chloride 95 (*)    BUN 35 (*)    Creatinine, Ser 7.60 (*)    Glucose, Bld 176 (*)    Hemoglobin 11.9 (*)    HCT 35.0 (*)    All other components within normal limits  GLUCOSE, CAPILLARY  GLUCOSE, CAPILLARY   I am able  to doppler a weak radial pulse   12:18 AM d/w VAS Dr Kellie Simmering who evaluated bedside. He took patient to the OR  MDM  Diagnosis: Ischemic right hand  Labs reviewed as above Vascular consult Admitted to the operating room for revascularization    Teressa Lower, MD 08/28/13 [phone]

## 2013-08-27 NOTE — Progress Notes (Signed)
Hypoglycemic Event  CBG: 58  Treatment: 15 carbs sprite /graham crackers  Symptoms: none  Follow-up CBG: [id number] CBG Result:81  Possible Reasons for Event: inadequate meal?  Comments/MD notified:paged    Jillyn Ledger  Remember to initiate Hypoglycemia Order Set & complete

## 2013-08-27 NOTE — Progress Notes (Signed)
LIJ CVC removed per order.  Vaseline guaze and dry 4x4 applied to site.  No signs of bleeding or infection noted.  Pt and S.O. Verbalizes understanding of site care and interventions for complications via teachback method.  No ADN.

## 2013-08-27 NOTE — Telephone Encounter (Addendum)
Message copied by Gena Fray on Thu Aug 27, 2013  9:37 AM ------      Message from: Richrd Prime      Created: Thu Aug 27, 2013  8:11 AM       Change F/U to 4 weeks with arterial duplex of Right arm - lawson ------  08/27/13: no answer, no machine/ mailed letter, dpm

## 2013-08-27 NOTE — Op Note (Signed)
OPERATIVE REPORT  Date of Surgery: 08/26/2013 - 08/27/2013  Surgeon: Tinnie Gens, MD  Assistant: Johnette Abraham  Pre-op Diagnosis: Ischemic right hand secondary to embolus to right radial and ulnar arteries Post-op Diagnosis: Same  Procedure: Procedure(s) trans-brachial embolectomy of right radial and ulnar arteries  Anesthesia: MAC  EBL: 30 cc  Complications: None  Procedure Details:  Patient in the operating room placed in supine position at which time satisfactory sedation anesthesia was administered. Right upper extremity was prepped with Betadine scrub and solution draped in routine sterile manner. After infiltration with 1% Xylocaine with epinephrine a longitudinal incision was made just distal to the antecubital crease. The brachial artery was dissected free encircled with vessel loop. Was mildly diseased. The radial ulnar and interosseous arteries were all encircled with vessel loops as well. 3000 units of heparin was given intravenously. A transverse opening was made in the distal brachial artery which did have an excellent pulse. 4 Fogarty catheter was passed proximally up into the subclavian artery and upon return no thrombus was retrieved. Following this a 3 Fogarty catheter was selectively passed down the radial ulnar and interosseous arteries. Thrombus was retrieved from the ulnar and radial arteries. There didn't seem to be significant disease in the distal radial and ulnar arteries which seemed like calcification. There was good backbleeding coming from all vessels after this was completed. All vessels were flushed with heparin saline endarterectomy was closed with 2 continuous 6-0 Prolene sutures. Clamps were released and there was audible Doppler flow which was brisk in the right radial artery. Patient stated that the hand felt much better. Adequate hemostasis was achieved and the wound closed in layers with Vicryl in a subcuticular fashion with Dermabond patient taken to  the recovery room in stable condition   Tinnie Gens, MD 08/27/2013 3:09 AM

## 2013-08-27 NOTE — ED Notes (Signed)
Dr Marnette Burgess to bedside to insert IJ as both pt's arms are restricted, vascular surgeon to bedside and plans to take pt to OR

## 2013-08-27 NOTE — Progress Notes (Signed)
08/27/13 1410 nsg Patient to be discharge to home with wife and daughter.

## 2013-08-27 NOTE — Discharge Summary (Signed)
[facility] Discharge Summary   Patient ID:  Ernest [name] MRN: [id number] DOB/AGE: 23-May-1946 67 y.o.  Admit date: 08/26/2013 Discharge date: 08/27/2013 Date of Surgery: 08/26/2013 - 08/27/2013 Surgeon: Juliann Mule): Mal Misty, MD  Admission Diagnosis: right arm pain/hand pain/not able to move it Embolism and thrombosis [453.9]  Discharge Diagnoses:  right arm pain/hand pain/not able to move it Embolism and thrombosis [453.9]  Secondary Diagnoses: Past Medical History  Diagnosis Date  . Hyperlipidemia   . Retinopathy   . Gout   . ESRD (end stage renal disease)     Started HD in [place] in 2009, ESRD was due to DM. Moved to [facility] in Dec 2009 and now gets dialysis at [facility] on a MWF schedule.     . Type II or unspecified type diabetes mellitus without mention of complication, not stated as uncontrolled     adult onset  . Erectile dysfunction     penile implant  . Abnormal stress test     s/p cath November 2013 with modest disease involving the ostial left main, proximal LAD, proximal RCA - do not appear to be hemodynamically signficant; mild LV dysfunction  . Obesity   . Hypertension   . Hepatitis C   . COPD (chronic obstructive pulmonary disease)   . Coronary artery disease     per cath report 2013  . Ejection fraction < 50%   . Chronic systolic CHF (congestive heart failure)   . Bacteremia     Procedure(s):Procedure(s) trans-brachial embolectomy of right radial and ulnar arteries   Discharged Condition: good  HPI:  Ernest [name] is a 67 y.o. male who presents for evaluation of numb right hand. Patient had hemodialysis today through a left upper arm AV graft. The blood pressure cuff was on the right arm. Patient developed pain in the right forearm and eventually numbness in right hand during and following dialysis. The patient was arranged for followup tomorrow in our office by Dr. Oneida Alar. The patient began having increasing numbness and  discomfort in the right hand and came to the emergency department. The left upper arm AV graft has been functioning well. The right upper arm fistula has been patent recently but has not functioned in some time. Ischemic appearing numb right hand likely secondary to embolization to right brachial and distal arteries following hemodialysis with thrombosis of right upper arm AV fistula which was not being utilized for dialysis-blood pressure was being monitored and right upper extremity with cuff  Plan exploration right brachial artery with attempted embolectomy.   Hospital Course:  Ernest [name] is a 67 y.o. male is S/P Procedure(s) trans-brachial embolectomy of right radial and ulnar arteries Physical exam: General alert and oriented x3  Right upper extremity incision healing nicely with no hematoma  2+ radial and Ulnar pulse palpable on the right with warm right hand and good strength and intact sensation Post-op wounds healing well Pt. Ambulating, voiding and taking PO diet without difficulty. Pt pain controlled with PO pain meds. Labs as below Complications:none  Consults:     Significant Diagnostic Studies: CBC Lab Results  Component Value Date   WBC 9.1 08/27/2013   HGB 11.0* 08/27/2013   HCT 32.0* 08/27/2013   MCV 89.1 08/27/2013   PLT 222 08/27/2013    BMET    Component Value Date/Time   NA 138 08/27/2013 0027   K 4.1 08/27/2013 0027   CL 95* 08/27/2013 0027   CO2 26 06/12/2013 0830  GLUCOSE 176* 08/27/2013 0027   BUN 35* 08/27/2013 0027   CREATININE 8.09* 08/27/2013 0535   CALCIUM 9.1 06/12/2013 0830   GFRNONAA 6* 08/27/2013 0535   GFRAA 7* 08/27/2013 0535   COAG Lab Results  Component Value Date   INR 1.0 08/21/2012   INR 1.02 10/14/2009   INR 1.12 09/26/2009     Disposition:  Discharge to :Home Discharge Orders   Future Appointments Provider Department Dept Phone   09/17/2013 3:15 PM Trudie Buckler, [place] [facility] [place]    09/29/2013 8:30 AM [facility] [phone]   09/29/2013 9:00 AM Mal Misty, MD [facility] -[facility] [phone]   Future Orders Complete By Expires   Call MD for:  redness, tenderness, or signs of infection (pain, swelling, bleeding, redness, odor or green/yellow discharge around incision site)  As directed    Call MD for:  severe or increased pain, loss or decreased feeling  in affected limb(s)  As directed    Call MD for:  temperature >100.5  As directed    Driving Restrictions  As directed    Comments:     No driving for at least 48 hours and you are off pain medication   Increase activity slowly  As directed    Comments:     Walk with assistance use walker or cane as needed   may wash over wound with mild soap and water  As directed    No dressing needed  As directed    Resume previous diet  As directed        Medication List         albuterol 108 (90 BASE) MCG/ACT inhaler  Commonly known as:  PROVENTIL HFA;VENTOLIN HFA  Inhale 2 puffs into the lungs every 6 (six) hours as needed for wheezing.     allopurinol 100 MG tablet  Commonly known as:  ZYLOPRIM  Take 100 mg by mouth daily.     amitriptyline 25 MG tablet  Commonly known as:  ELAVIL  Take 25 mg by mouth at bedtime.     aspirin EC 81 MG tablet  Take 1 tablet (81 mg total) by mouth daily.     Calcium Acetate 667 MG Tabs  Take 1 tablet by mouth 3 (three) times daily with meals.     cinacalcet 60 MG tablet  Commonly known as:  SENSIPAR  Take 60 mg by mouth 2 (two) times daily.     clopidogrel 75 MG tablet  Commonly known as:  PLAVIX  Take 75 mg by mouth daily.     colchicine 0.6 MG tablet  Take 0.6 mg by mouth daily as needed (for gout flare-ups).     insulin NPH 100 UNIT/ML injection  Commonly known as:  HUMULIN N,NOVOLIN N  Inject 20 Units into the skin 2 (two) times daily.     insulin regular 100 units/mL injection  Commonly known as:   NOVOLIN R,HUMULIN R  Inject 40 Units into the skin 3 (three) times daily before meals.     losartan 50 MG tablet  Commonly known as:  COZAAR  Take 50 mg by mouth daily.     metoprolol 50 MG tablet  Commonly known as:  LOPRESSOR  Take 50 mg by mouth every evening.     multivitamin Tabs tablet  Take 1 tablet by mouth daily.     oxyCODONE 5 MG immediate release tablet  Commonly known as:  ROXICODONE  Take 1-2 tablets (5-10 mg total) by mouth every 4 (four) hours as needed for severe pain.     sevelamer carbonate 800 MG tablet  Commonly known as:  RENVELA  Take 800 mg by mouth 3 (three) times daily with meals.     simvastatin 20 MG tablet  Commonly known as:  ZOCOR  Take 20 mg by mouth at bedtime.       Verbal and written Discharge instructions given to the patient. Wound care per Discharge AVS     Follow-up Information   Follow up with Tinnie Gens, MD In 2 weeks. (office will arrange-sent)    Specialty:  Vascular Surgery   Contact information:   [address] [place] [address] [phone]       Signed: Richrd Prime 08/27/2013, 11:53 AM

## 2013-08-27 NOTE — Progress Notes (Signed)
Patient ID: Ernest [name], male   DOB: 07/27/1946, 67 y.o.   MRN: [id number] Vascular Surgery Progress Note  Subjective: 5 hours post embolectomy of right radial and ulnar arteries. Patient states right hand feels much better. Is not healed totally normal but is not in no pain. He takes Plavix at home on daily basis. Due for hemodialysis tomorrow.   Objective:  Filed Vitals:   08/27/13 0540  BP: 138/74  Pulse: 80  Temp: 97.7 F (36.5 C)  Resp: 12   General alert and oriented x3 Right upper extremity incision healing nicely with no hematoma 2+ radial pulse palpable on the right with warm right hand and good strength and intact sensation  Labs:  Recent Labs Lab 08/27/13 0027 08/27/13 0535  CREATININE 7.60* 8.09*    Recent Labs Lab 08/27/13 0027 08/27/13 0535  NA 138  --   K 4.1  --   CL 95*  --   BUN 35*  --   CREATININE 7.60* 8.09*  GLUCOSE 176*  --     Recent Labs Lab 08/27/13 0001 08/27/13 0027 08/27/13 0535  WBC 10.4  --  9.1  HGB 11.3* 11.9* 11.0*  HCT 34.1* 35.0* 32.0*  PLT 231  --  222   No results found for this basename: INR,  in the last 168 hours  I/O last 3 completed shifts: In: 100 [I.V.:100] Out: -   Imaging: No results found.  Assessment/Plan:  POD #1  LOS: 1 day  s/p Procedure(s): EMBOLECTOMY  Doing well 5 hours post removal of thrombus from right radial and ulnar arteries. Patient had a blood pressure cuff monitoring during hemodialysis and that is when this occurred. I suspect that the old fistula thrombosed and some thrombus embolized to radial and ulnar arteries since symptoms started rather abruptly  Plan discharge later today and followup in office in 4 weeks. We'll obtain duplex scan of right upper extremity at that time. Patient does have significant occlusive disease in radial and ulnar arteries.   Tinnie Gens, MD 08/27/2013 8:12 AM

## 2013-08-27 NOTE — Anesthesia Preprocedure Evaluation (Signed)
Anesthesia Evaluation  Patient identified by MRN, date of birth, ID band Patient awake    Reviewed: Allergy & Precautions, H&P , NPO status , Patient's Chart, lab work & pertinent test results  Airway Mallampati: II TM Distance: >3 FB     Dental  (+) Teeth Intact, Dental Advisory Given and Edentulous Upper   Pulmonary former smoker,  breath sounds clear to auscultation        Cardiovascular hypertension, Pt. on medications Rhythm:Regular Rate:Normal     Neuro/Psych    GI/Hepatic   Endo/Other  diabetes, Well Controlled, Type 2, Insulin Dependent  Renal/GU      Musculoskeletal   Abdominal   Peds  Hematology   Anesthesia Other Findings   Reproductive/Obstetrics                           Anesthesia Physical Anesthesia Plan  ASA: III  Anesthesia Plan: MAC   Post-op Pain Management:    Induction: Intravenous  Airway Management Planned: Simple Face Mask and Natural Airway  Additional Equipment: CVP  Intra-op Plan:   Post-operative Plan:   Informed Consent: I have reviewed the patients History and Physical, chart, labs and discussed the procedure including the risks, benefits and alternatives for the proposed anesthesia with the patient or authorized representative who has indicated his/her understanding and acceptance.   Dental advisory given  Plan Discussed with: CRNA and Surgeon  Anesthesia Plan Comments:         Anesthesia Quick Evaluation

## 2013-08-27 NOTE — Progress Notes (Signed)
Patient ID: Ernest [name], male   DOB: [birth year], 67 y.o.   MRN: [id number] Vascular Surgery H&P  Chief Complaint: Numb right hand  HPI: Ernest [name] is a 67 y.o. male who presents for evaluation of numb right hand. Patient had hemodialysis today through a left upper arm AV graft. The blood pressure cuff was on the right arm. Patient developed pain in the right forearm and eventually numbness in right hand during and following dialysis. The patient was arranged for followup tomorrow in our office by Dr. Oneida Alar. The patient began having increasing numbness and discomfort in the right hand and came to the emergency department. The left upper arm AV graft has been functioning well. The right upper arm fistula has been patent recently but has not functioned in some time.   Past Medical History  Diagnosis Date  . Hyperlipidemia   . Retinopathy   . Gout   . ESRD (end stage renal disease)     Started HD in [place] in 2009, ESRD was due to DM. Moved to [facility] in Dec 2009 and now gets dialysis at [facility] on a MWF schedule.     . Type II or unspecified type diabetes mellitus without mention of complication, not stated as uncontrolled     adult onset  . Erectile dysfunction     penile implant  . Abnormal stress test     s/p cath November 2013 with modest disease involving the ostial left main, proximal LAD, proximal RCA - do not appear to be hemodynamically signficant; mild LV dysfunction  . Obesity   . Hypertension   . Hepatitis C   . COPD (chronic obstructive pulmonary disease)   . Coronary artery disease     per cath report 2013  . Ejection fraction < 50%   . Chronic systolic CHF (congestive heart failure)   . Bacteremia    Past Surgical History  Procedure Laterality Date  . Penile prosthesis implant    . Fistula      RUE and wrist  . Cardiac catheterization  August 26, 2012  . Eye surgery      laser.  and surgery for DM  . Insertion of dialysis catheter Right 01/01/2013   Procedure: INSERTION OF DIALYSIS CATHETER right  internal jugular;  Surgeon: Serafina Mitchell, MD;  Location: [place];  Service: Vascular;  Laterality: Right;  . Av fistula placement Left 01/13/2013    Procedure: INSERTION OF ARTERIOVENOUS (AV) GORE-TEX GRAFT ARM;  Surgeon: Angelia Mould, MD;  Location: [place];  Service: Vascular;  Laterality: Left;  . Thrombectomy w/ embolectomy Left 03/26/2013    Procedure: Attempted thrombectomy of left arm arteriovenous goretex graft.;  Surgeon: Angelia Mould, MD;  Location: [place];  Service: Vascular;  Laterality: Left;  . Insertion of dialysis catheter N/A 03/26/2013    Procedure: INSERTION OF DIALYSIS CATHETER Left internal jugular vein;  Surgeon: Angelia Mould, MD;  Location: [place];  Service: Vascular;  Laterality: N/A;  . Avgg removal Left 03/26/2013    Procedure: REMOVAL OF  NON INCORPORATED ARTERIOVENOUS GORETEX GRAFT ([place]) left arm * repair of  left brachial artery with vein patch angioplasty.;  Surgeon: Angelia Mould, MD;  Location: [place];  Service: Vascular;  Laterality: Left;  . Av fistula placement Left 05/05/2013    Procedure: INSERTION OF LEFT UPPER ARM  ARTERIOVENOUS GORTEX GRAFT;  Surgeon: Angelia Mould, MD;  Location: [place];  Service: Vascular;  Laterality: Left;  . [place] Dates  without cardioversion N/A 06/11/2013    Procedure: TRANSESOPHAGEAL ECHOCARDIOGRAM (TEE);  Surgeon: Larey Dresser, MD;  Location: [facility] ENDOSCOPY;  Service: Cardiovascular;  Laterality: N/A;   History   Social History  . Marital Status: Married    Spouse Name: N/A    Number of Children: N/A  . Years of Education: N/A   Occupational History  . 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 Smoking status: Former Smoker -- 7 years    Quit date: 06/10/1973  . Smokeless tobacco: Never Used  . Alcohol Use: No  . Drug Use: No  . Sexual Activity: No   Other Topics Concern  . Not on file   Social History Narrative   Adopted  mentally retarded child at home, 2 adopted children, 3 living and 2 deceased.   Family History  Problem Relation Age of Onset  . Heart disease Father    [name] Prior to Admission medications   Medication Sig Start Date End Date Taking? Authorizing Provider  albuterol (PROVENTIL HFA;VENTOLIN HFA) 108 (90 BASE) MCG/ACT inhaler Inhale 2 puffs into the lungs every 6 (six) hours as needed for wheezing.   Yes Historical Provider, MD  allopurinol (ZYLOPRIM) 100 MG tablet Take 100 mg by mouth daily.    Yes Historical Provider, MD  amitriptyline (ELAVIL) 25 MG tablet Take 25 mg by mouth at bedtime.   Yes Historical Provider, MD  aspirin EC 81 MG tablet Take 1 tablet (81 mg total) by mouth daily. 06/18/13  Yes Carlena Bjornstad, MD  Calcium Acetate 667 MG TABS Take 1 tablet by mouth 3 (three) times daily with meals.    Yes Historical Provider, MD  cinacalcet (SENSIPAR) 60 MG tablet Take 60 mg by mouth 2 (two) times daily.    Yes Historical Provider, MD  clopidogrel (PLAVIX) 75 MG tablet Take 75 mg by mouth daily.    Yes Historical Provider, MD  colchicine 0.6 MG tablet Take 0.6 mg by mouth daily as needed (for gout flare-ups).    Yes Historical Provider, MD  insulin NPH (HUMULIN N,NOVOLIN N) 100 UNIT/ML injection Inject 20 Units into the skin 2 (two) times daily.    Yes Historical Provider, MD  insulin regular (NOVOLIN R,HUMULIN R) 100 units/mL injection Inject 40 Units into the skin 3 (three) times daily before meals.    Yes Historical Provider, MD  losartan (COZAAR) 50 MG tablet Take 50 mg by mouth daily.   Yes Historical Provider, MD  metoprolol (LOPRESSOR) 50 MG tablet Take 50 mg by mouth every evening.    Yes Historical Provider, MD  multivitamin (RENA-VIT) TABS tablet Take 1 tablet by mouth daily.     Yes Historical Provider, MD  sevelamer (RENVELA) 800 MG tablet Take 800 mg by mouth 3 (three) times daily with meals.    Yes Historical Provider, MD  simvastatin (ZOCOR) 20 MG tablet Take 20  mg by mouth at bedtime.     Yes Historical Provider, MD     Positive ROS: Patient denies active chest pain, dyspnea on exertion, PND, orthopnea. Does have diabetes mellitus type 1  All other systems have been reviewed and were otherwise negative with the exception of those mentioned in the HPI and as above.  Physical Exam: Filed Vitals:   08/26/13 2333  BP: 182/86  Pulse: 106  Temp: 97.9 F (36.6 C)  Resp: 22    General: Alert, no acute distress HEENT: Normal for age Cardiovascular: Regular rate  and rhythm. Carotid pulses 2+, no bruits audible Respiratory: Clear to auscultation. No cyanosis, no use of accessory musculature GI: No organomegaly, abdomen is soft and non-tender Skin: No lesions in the area of chief complaint Neurologic: Sensation intact distally Psychiatric: Patient is competent for consent with normal mood and affect Musculoskeletal: No obvious deformities Extremities: Left upper extremity with AV graft with good pulse and palpable thrill. Left tan-pink and well perfused. Right upper extremity with multiple keloids in upper arm over puncture sites from old right brachial-cephalic AV fistula. No palpable radial or ulnar pulses. Right hand is pale and cooler than left. Decreased sensation in the right palm. Very weak flow noted with hand-held Doppler and ulnar artery which is somewhat continuous and minimal flow in right radial artery. Good biphasic flow in the contralateral left brachial and radial artery.  Labs reviewed: Potassium 4.1     Assessment/Plan:  Ischemic appearing numb right hand likely secondary to embolization to right brachial and distal arteries following hemodialysis with thrombosis of right upper arm AV fistula which was not being utilized for dialysis-blood pressure was being monitored and right upper extremity with cuff  Plan exploration right brachial artery with attempted embolectomy. Risks and benefits discussed with patient and his wife  and he would like to proceed   Tinnie Gens, MD 08/27/2013 1:13 AM

## 2013-08-28 DIAGNOSIS — D509 Iron deficiency anemia, unspecified: Secondary | ICD-10-CM | POA: Diagnosis not present

## 2013-08-28 DIAGNOSIS — N186 End stage renal disease: Secondary | ICD-10-CM | POA: Diagnosis not present

## 2013-08-28 DIAGNOSIS — N2581 Secondary hyperparathyroidism of renal origin: Secondary | ICD-10-CM | POA: Diagnosis not present

## 2013-08-28 NOTE — Telephone Encounter (Signed)
Patient at dialysis.  Will try again later

## 2013-08-28 NOTE — Anesthesia Postprocedure Evaluation (Signed)
  Anesthesia Post-op Note  Patient: Ernest [name]  Procedure(s) Performed: Procedure(s) with comments: EMBOLECTOMY (Right) - Thrombectomy of Radial and ulnar artery.  Patient Location: PACU  Anesthesia Type:MAC  Level of Consciousness: awake, alert  and oriented  Airway and Oxygen Therapy: Patient Spontanous Breathing and Patient connected to nasal cannula oxygen  Post-op Pain: mild  Post-op Assessment: Post-op Vital signs reviewed  Post-op Vital Signs: Reviewed  Complications: No apparent anesthesia complications

## 2013-08-31 ENCOUNTER — Encounter (HOSPITAL_COMMUNITY): Payer: Self-pay | Admitting: Vascular Surgery

## 2013-08-31 DIAGNOSIS — N2581 Secondary hyperparathyroidism of renal origin: Secondary | ICD-10-CM | POA: Diagnosis not present

## 2013-08-31 DIAGNOSIS — N186 End stage renal disease: Secondary | ICD-10-CM | POA: Diagnosis not present

## 2013-08-31 DIAGNOSIS — D509 Iron deficiency anemia, unspecified: Secondary | ICD-10-CM | POA: Diagnosis not present

## 2013-08-31 NOTE — Telephone Encounter (Signed)
Spoke with patient's daughter and told her I needed to speak to her father about his procedure.  She said he was at dialysis and the best time to call was a Tuesday or Thursday.  I will try again tomorrow

## 2013-09-02 DIAGNOSIS — N186 End stage renal disease: Secondary | ICD-10-CM | POA: Diagnosis not present

## 2013-09-02 DIAGNOSIS — D509 Iron deficiency anemia, unspecified: Secondary | ICD-10-CM | POA: Diagnosis not present

## 2013-09-02 DIAGNOSIS — N2581 Secondary hyperparathyroidism of renal origin: Secondary | ICD-10-CM | POA: Diagnosis not present

## 2013-09-03 ENCOUNTER — Emergency Department (HOSPITAL_COMMUNITY)
Admission: EM | Admit: 2013-09-03 | Discharge: 2013-09-03 | Disposition: A | Discharge status: Home / Self Care | Payer: Medicare Other | Attending: Emergency Medicine | Admitting: Emergency Medicine

## 2013-09-03 ENCOUNTER — Encounter (HOSPITAL_COMMUNITY): Payer: Self-pay | Admitting: Emergency Medicine

## 2013-09-03 DIAGNOSIS — Z992 Dependence on renal dialysis: Secondary | ICD-10-CM | POA: Diagnosis not present

## 2013-09-03 DIAGNOSIS — Z7982 Long term (current) use of aspirin: Secondary | ICD-10-CM | POA: Insufficient documentation

## 2013-09-03 DIAGNOSIS — I120 Hypertensive chronic kidney disease with stage 5 chronic kidney disease or end stage renal disease: Secondary | ICD-10-CM | POA: Diagnosis not present

## 2013-09-03 DIAGNOSIS — K089 Disorder of teeth and supporting structures, unspecified: Secondary | ICD-10-CM | POA: Insufficient documentation

## 2013-09-03 DIAGNOSIS — N186 End stage renal disease: Secondary | ICD-10-CM | POA: Insufficient documentation

## 2013-09-03 DIAGNOSIS — Z87891 Personal history of nicotine dependence: Secondary | ICD-10-CM | POA: Diagnosis not present

## 2013-09-03 DIAGNOSIS — M109 Gout, unspecified: Secondary | ICD-10-CM | POA: Insufficient documentation

## 2013-09-03 DIAGNOSIS — E785 Hyperlipidemia, unspecified: Secondary | ICD-10-CM | POA: Insufficient documentation

## 2013-09-03 DIAGNOSIS — Z95818 Presence of other cardiac implants and grafts: Secondary | ICD-10-CM | POA: Diagnosis not present

## 2013-09-03 DIAGNOSIS — I5022 Chronic systolic (congestive) heart failure: Secondary | ICD-10-CM | POA: Diagnosis not present

## 2013-09-03 DIAGNOSIS — Z8669 Personal history of other diseases of the nervous system and sense organs: Secondary | ICD-10-CM | POA: Diagnosis not present

## 2013-09-03 DIAGNOSIS — Z79899 Other long term (current) drug therapy: Secondary | ICD-10-CM | POA: Insufficient documentation

## 2013-09-03 DIAGNOSIS — Z794 Long term (current) use of insulin: Secondary | ICD-10-CM | POA: Insufficient documentation

## 2013-09-03 DIAGNOSIS — Z7902 Long term (current) use of antithrombotics/antiplatelets: Secondary | ICD-10-CM | POA: Insufficient documentation

## 2013-09-03 DIAGNOSIS — M26629 Arthralgia of temporomandibular joint, unspecified side: Secondary | ICD-10-CM

## 2013-09-03 DIAGNOSIS — E119 Type 2 diabetes mellitus without complications: Secondary | ICD-10-CM | POA: Diagnosis not present

## 2013-09-03 DIAGNOSIS — E669 Obesity, unspecified: Secondary | ICD-10-CM | POA: Diagnosis not present

## 2013-09-03 DIAGNOSIS — I2510 Atherosclerotic heart disease of native coronary artery without angina pectoris: Secondary | ICD-10-CM | POA: Insufficient documentation

## 2013-09-03 DIAGNOSIS — J4489 Other specified chronic obstructive pulmonary disease: Secondary | ICD-10-CM | POA: Insufficient documentation

## 2013-09-03 DIAGNOSIS — J449 Chronic obstructive pulmonary disease, unspecified: Secondary | ICD-10-CM | POA: Insufficient documentation

## 2013-09-03 DIAGNOSIS — K0889 Other specified disorders of teeth and supporting structures: Secondary | ICD-10-CM

## 2013-09-03 DIAGNOSIS — Z8619 Personal history of other infectious and parasitic diseases: Secondary | ICD-10-CM | POA: Insufficient documentation

## 2013-09-03 MED ORDER — PENICILLIN V POTASSIUM 250 MG PO TABS
500.0000 mg | ORAL_TABLET | Freq: Once | ORAL | Status: AC
  Administered 2013-09-03: 500 mg via ORAL
  Filled 2013-09-03: qty 2

## 2013-09-03 MED ORDER — PENICILLIN V POTASSIUM 500 MG PO TABS: 500.0000 mg | ORAL_TABLET | Freq: Three times a day (TID) | ORAL | Status: DC

## 2013-09-03 MED ORDER — OXYCODONE-ACETAMINOPHEN 5-325 MG PO TABS
2.0000 | ORAL_TABLET | Freq: Once | ORAL | Status: AC
  Administered 2013-09-03: 2 via ORAL
  Filled 2013-09-03: qty 2

## 2013-09-03 NOTE — ED Notes (Signed)
Pt from home, c/o left neck pain. Pt was discharged last Thurs, Had EJ removed. States pain and swelling to left side of neck starting yesterday. Airway intact, states it hurts to eat

## 2013-09-03 NOTE — ED Provider Notes (Signed)
Medical screening examination/treatment/procedure(s) were conducted as a shared visit with non-physician practitioner(s) and myself.  I personally evaluated the patient during the encounter.  EKG Interpretation   None        TTP left tmj on exam.  Mild submandibular ttp but no mass or swelling.  Left TM normal. Could be a dental etiology, tmj dyndrome.  At this time there does not appear to be any evidence of an acute emergency medical condition and the patient appears stable for discharge with appropriate outpatient follow up. Rec dental evaluation.  Kathalene Frames, MD 09/03/13 1125

## 2013-09-03 NOTE — ED Provider Notes (Signed)
CSN: [id number]     Arrival date & time 09/03/13  1018 History   First MD Initiated Contact with Patient 09/03/13 1028     Chief Complaint  Patient presents with  . Neck Pain   (Consider location/radiation/quality/duration/timing/severity/associated sxs/prior Treatment) Patient is a 67 y.o. male presenting with neck pain. The history is provided by the patient and the spouse. No language interpreter was used.  Neck Pain Pain location:  L side Associated symptoms: no fever   Associated symptoms comment:  Pain and swelling to left jaw since that night. He and spouse were concerned that it was connected to an EJ IV he had during a recent hospitalization, discharged home 08/27/13. He reports pain around lower left dentition. No fever. Painful to chew.    Past Medical History  Diagnosis Date  . Hyperlipidemia   . Retinopathy   . Gout   . ESRD (end stage renal disease)     Started HD in [place] in 2009, ESRD was due to DM. Moved to [facility] in Dec 2009 and now gets dialysis at [facility] on a MWF schedule.     . Type II or unspecified type diabetes mellitus without mention of complication, not stated as uncontrolled     adult onset  . Erectile dysfunction     penile implant  . Abnormal stress test     s/p cath November 2013 with modest disease involving the ostial left main, proximal LAD, proximal RCA - do not appear to be hemodynamically signficant; mild LV dysfunction  . Obesity   . Hypertension   . Hepatitis C   . COPD (chronic obstructive pulmonary disease)   . Coronary artery disease     per cath report 2013  . Ejection fraction < 50%   . Chronic systolic CHF (congestive heart failure)   . Bacteremia    Past Surgical History  Procedure Laterality Date  . Penile prosthesis implant    . Fistula      RUE and wrist  . Cardiac catheterization  August 26, 2012  . Eye surgery      laser.  and surgery for DM  . Insertion of dialysis catheter Right 01/01/2013    Procedure: INSERTION  OF DIALYSIS CATHETER right  internal jugular;  Surgeon: Serafina Mitchell, MD;  Location: [place];  Service: Vascular;  Laterality: Right;  . Av fistula placement Left 01/13/2013    Procedure: INSERTION OF ARTERIOVENOUS (AV) GORE-TEX GRAFT ARM;  Surgeon: Angelia Mould, MD;  Location: [place];  Service: Vascular;  Laterality: Left;  . Thrombectomy w/ embolectomy Left 03/26/2013    Procedure: Attempted thrombectomy of left arm arteriovenous goretex graft.;  Surgeon: Angelia Mould, MD;  Location: [place];  Service: Vascular;  Laterality: Left;  . Insertion of dialysis catheter N/A 03/26/2013    Procedure: INSERTION OF DIALYSIS CATHETER Left internal jugular vein;  Surgeon: Angelia Mould, MD;  Location: [place];  Service: Vascular;  Laterality: N/A;  . Avgg removal Left 03/26/2013    Procedure: REMOVAL OF  NON INCORPORATED ARTERIOVENOUS GORETEX GRAFT ([place]) left arm * repair of  left brachial artery with vein patch angioplasty.;  Surgeon: Angelia Mould, MD;  Location: [place];  Service: Vascular;  Laterality: Left;  . Av fistula placement Left 05/05/2013    Procedure: INSERTION OF LEFT UPPER ARM  ARTERIOVENOUS GORTEX GRAFT;  Surgeon: Angelia Mould, MD;  Location: [place];  Service: Vascular;  Laterality: Left;  . [place] without cardioversion N/A 06/11/2013  Procedure: TRANSESOPHAGEAL ECHOCARDIOGRAM (TEE);  Surgeon: Larey Dresser, MD;  Location: [place];  Service: Cardiovascular;  Laterality: N/A;  . Embolectomy Right 08/27/2013    Procedure: EMBOLECTOMY;  Surgeon: Mal Misty, MD;  Location: [place];  Service: Vascular;  Laterality: Right;  Thrombectomy of Radial and ulnar artery.   Family History  Problem Relation Age of Onset  . Heart disease Father    History  Substance Use Topics  . Smoking status: Former Smoker -- 7 years    Quit date: 06/10/1973  . Smokeless tobacco: Never Used  . Alcohol Use: No    Review of Systems  Constitutional: Negative for fever and  chills.  HENT: Positive for dental problem and facial swelling.        See HPI.  Respiratory: Negative.   Cardiovascular: Negative.   Musculoskeletal: Positive for neck pain.  Skin: Negative.     Allergies  Review of patient's allergies indicates no known allergies.  Home Medications   Current Outpatient Rx  Name  Route  Sig  Dispense  Refill  . allopurinol (ZYLOPRIM) 100 MG tablet   Oral   Take 100 mg by mouth daily.          [name] amitriptyline (ELAVIL) 25 MG tablet   Oral   Take 25 mg by mouth at bedtime as needed for sleep.          [name] aspirin EC 81 MG tablet   Oral   Take 1 tablet (81 mg total) by mouth daily.   90 tablet   3   . Calcium Acetate 667 MG TABS   Oral   Take 1 tablet by mouth 3 (three) times daily with meals.          . cinacalcet (SENSIPAR) 60 MG tablet   Oral   Take 120 mg by mouth 2 (two) times daily.          . clopidogrel (PLAVIX) 75 MG tablet   Oral   Take 75 mg by mouth daily.          . colchicine 0.6 MG tablet   Oral   Take 0.6 mg by mouth daily as needed (for gout flare-ups).          . insulin NPH (HUMULIN N,NOVOLIN N) 100 UNIT/ML injection   Subcutaneous   Inject 20 Units into the skin 2 (two) times daily.          . insulin regular (NOVOLIN R,HUMULIN R) 100 units/mL injection   Subcutaneous   Inject 40 Units into the skin 3 (three) times daily before meals.          [name] losartan (COZAAR) 50 MG tablet   Oral   Take 50 mg by mouth daily.         . metoprolol (LOPRESSOR) 50 MG tablet   Oral   Take 50 mg by mouth every morning.          . multivitamin (RENA-VIT) TABS tablet   Oral   Take 1 tablet by mouth daily.           [name] oxyCODONE (OXY IR/ROXICODONE) 5 MG immediate release tablet   Oral   Take 10 mg by mouth daily as needed for severe pain.         . sevelamer (RENVELA) 800 MG tablet   Oral   Take 2,400 mg by mouth 3 (three) times daily with meals.          . simvastatin (ZOCOR) 20  MG tablet    Oral   Take 20 mg by mouth every morning.          [name] albuterol (PROVENTIL HFA;VENTOLIN HFA) 108 (90 BASE) MCG/ACT inhaler   Inhalation   Inhale 2 puffs into the lungs every 12 (twelve) hours as needed for wheezing.           BP 146/65  Pulse 92  Temp(Src) 98.5 F (36.9 C) (Oral)  Resp 16  SpO2 97% Physical Exam  Constitutional: He is oriented to person, place, and time. He appears well-developed and well-nourished. No distress.  HENT:  Left TMJ tenderness and mild swelling. No redness. Left TM and external canal normal. Partially edentulous. Tenderness left lower alveolar ridge. No visualized abscess.   Pulmonary/Chest: Effort normal and breath sounds normal.  Musculoskeletal:  Left neck unremarkable and nontender.  Neurological: He is alert and oriented to person, place, and time.  Skin: Skin is warm and dry.  Psychiatric: He has a normal mood and affect.    ED Course  Procedures (including critical care time) Labs Review Labs Reviewed - No data to display Imaging Review No results found.  EKG Interpretation   None       MDM  No diagnosis found. 1. Dental pain 2. TMJ pain, left  Left neck non-tender and without redness or swelling at probable location of EJ. Doubt pain is associated with previous IV, and feel most likely pain is related to dental infection or TMJ inflammation.     Dewaine Oats, [name] 09/03/13 1201

## 2013-09-04 ENCOUNTER — Telehealth: Payer: Self-pay | Admitting: *Deleted

## 2013-09-04 DIAGNOSIS — N2581 Secondary hyperparathyroidism of renal origin: Secondary | ICD-10-CM | POA: Diagnosis not present

## 2013-09-04 DIAGNOSIS — D509 Iron deficiency anemia, unspecified: Secondary | ICD-10-CM | POA: Diagnosis not present

## 2013-09-04 DIAGNOSIS — N186 End stage renal disease: Secondary | ICD-10-CM | POA: Diagnosis not present

## 2013-09-04 NOTE — Telephone Encounter (Signed)
Ernest Craze, NP Ernest Saas, RN            Ernest [name],  Would you please let patient know that we got his colonoscopy records. He is up to date on screening colonoscopy and shouldn't need one for kidney transplant list.  Thanks    Left a message for patient to call me.

## 2013-09-04 NOTE — Telephone Encounter (Signed)
Left a message for patient to call me. 

## 2013-09-07 DIAGNOSIS — N186 End stage renal disease: Secondary | ICD-10-CM | POA: Diagnosis not present

## 2013-09-07 DIAGNOSIS — D509 Iron deficiency anemia, unspecified: Secondary | ICD-10-CM | POA: Diagnosis not present

## 2013-09-07 DIAGNOSIS — N2581 Secondary hyperparathyroidism of renal origin: Secondary | ICD-10-CM | POA: Diagnosis not present

## 2013-09-08 NOTE — Telephone Encounter (Signed)
Spoke with patient and gave him Tye Savoy, NP recommendation.

## 2013-09-09 DIAGNOSIS — N186 End stage renal disease: Secondary | ICD-10-CM | POA: Diagnosis not present

## 2013-09-09 DIAGNOSIS — N2581 Secondary hyperparathyroidism of renal origin: Secondary | ICD-10-CM | POA: Diagnosis not present

## 2013-09-09 DIAGNOSIS — D509 Iron deficiency anemia, unspecified: Secondary | ICD-10-CM | POA: Diagnosis not present

## 2013-09-11 DIAGNOSIS — N2581 Secondary hyperparathyroidism of renal origin: Secondary | ICD-10-CM | POA: Diagnosis not present

## 2013-09-11 DIAGNOSIS — N186 End stage renal disease: Secondary | ICD-10-CM | POA: Diagnosis not present

## 2013-09-11 DIAGNOSIS — D509 Iron deficiency anemia, unspecified: Secondary | ICD-10-CM | POA: Diagnosis not present

## 2013-09-14 DIAGNOSIS — N186 End stage renal disease: Secondary | ICD-10-CM | POA: Diagnosis not present

## 2013-09-14 DIAGNOSIS — N2581 Secondary hyperparathyroidism of renal origin: Secondary | ICD-10-CM | POA: Diagnosis not present

## 2013-09-14 DIAGNOSIS — D509 Iron deficiency anemia, unspecified: Secondary | ICD-10-CM | POA: Diagnosis not present

## 2013-09-16 ENCOUNTER — Ambulatory Visit: Payer: Self-pay | Admitting: Podiatrist

## 2013-09-17 ENCOUNTER — Encounter: Payer: Self-pay | Admitting: Podiatrist

## 2013-09-17 ENCOUNTER — Ambulatory Visit (INDEPENDENT_AMBULATORY_CARE_PROVIDER_SITE_OTHER): Payer: Medicare Other | Admitting: Podiatrist

## 2013-09-17 VITALS — BP 159/126 | HR 76 | Resp 20 | Ht 69.0 in | Wt 248.0 lb

## 2013-09-17 DIAGNOSIS — M79609 Pain in unspecified limb: Secondary | ICD-10-CM

## 2013-09-17 DIAGNOSIS — B351 Tinea unguium: Secondary | ICD-10-CM

## 2013-09-17 NOTE — Patient Instructions (Signed)
Amlactin or LacHydrin Cream is available at the pharmacy to help with the dry skin of your feet and toes.    Diabetes and Foot Care Diabetes may cause you to have problems because of poor blood supply (circulation) to your feet and legs. This may cause the skin on your feet to become thinner, break easier, and heal more slowly. Your skin may become dry, and the skin may peel and crack. You may also have nerve damage in your legs and feet causing decreased feeling in them. You may not notice minor injuries to your feet that could lead to infections or more serious problems. Taking care of your feet is one of the most important things you can do for yourself.  HOME CARE INSTRUCTIONS  Wear shoes at all times, even in the house. Do not go barefoot. Bare feet are easily injured.  Check your feet daily for blisters, cuts, and redness. If you cannot see the bottom of your feet, use a mirror or ask someone for help.  Wash your feet with warm water (do not use hot water) and mild soap. Then pat your feet and the areas between your toes until they are completely dry. Do not soak your feet as this can dry your skin.  Apply a moisturizing lotion or petroleum jelly (that does not contain alcohol and is unscented) to the skin on your feet and to dry, brittle toenails. Do not apply lotion between your toes.  Trim your toenails straight across. Do not dig under them or around the cuticle. File the edges of your nails with an emery board or nail file.  Do not cut corns or calluses or try to remove them with medicine.  Wear clean socks or stockings every day. Make sure they are not too tight. Do not wear knee-high stockings since they may decrease blood flow to your legs.  Wear shoes that fit properly and have enough cushioning. To break in new shoes, wear them for just a few hours a day. This prevents you from injuring your feet. Always look in your shoes before you put them on to be sure there are no objects  inside.  Do not cross your legs. This may decrease the blood flow to your feet.  If you find a minor scrape, cut, or break in the skin on your feet, keep it and the skin around it clean and dry. These areas may be cleansed with mild soap and water. Do not cleanse the area with peroxide, alcohol, or iodine.  When you remove an adhesive bandage, be sure not to damage the skin around it.  If you have a wound, look at it several times a day to make sure it is healing.  Do not use heating pads or hot water bottles. They may burn your skin. If you have lost feeling in your feet or legs, you may not know it is happening until it is too late.  Make sure your health care provider performs a complete foot exam at least annually or more often if you have foot problems. Report any cuts, sores, or bruises to your health care provider immediately. SEEK MEDICAL CARE IF:   You have an injury that is not healing.  You have cuts or breaks in the skin.  You have an ingrown nail.  You notice redness on your legs or feet.  You feel burning or tingling in your legs or feet.  You have pain or cramps in your legs and feet.  Your legs or feet are numb.  Your feet always feel cold. SEEK IMMEDIATE MEDICAL CARE IF:   There is increasing redness, swelling, or pain in or around a wound.  There is a red line that goes up your leg.  Pus is coming from a wound.  You develop a fever or as directed by your health care provider.  You notice a bad smell coming from an ulcer or wound. Document Released: 09/28/2000 Document Revised: 06/03/2013 Document Reviewed: 03/10/2013 [facility] Patient Information 2014 [place].

## 2013-09-22 ENCOUNTER — Other Ambulatory Visit: Payer: Medicare Other | Admitting: Gastroenterology

## 2013-09-24 ENCOUNTER — Ambulatory Visit: Payer: Medicare Other | Admitting: Vascular Surgery

## 2013-09-24 ENCOUNTER — Other Ambulatory Visit (HOSPITAL_COMMUNITY): Payer: Medicare Other

## 2013-09-25 NOTE — Progress Notes (Signed)
HPI:  Patient presents today for follow up of foot and nail care. Denies any new complaints today.  Objective:  Patients chart is reviewed.  Neurovascular status unchanged.  Patients nails are thickened, discolored, distrophic, friable and brittle with yellow-brown discoloration. Patient subjectively relates they are painful with shoes and with ambulation of bilateral feet.  Assessment:  Symptomatic onychomycosis  Plan:  Discussed treatment options and alternatives.  The symptomatic toenails were debrided through manual an mechanical means without complication.  Return appointment recommended at routine intervals of 3 months    Dysen Edmondson, DPM   

## 2013-09-28 ENCOUNTER — Encounter: Payer: Self-pay | Admitting: Vascular Surgery

## 2013-09-29 ENCOUNTER — Ambulatory Visit (HOSPITAL_COMMUNITY)
Admit: 2013-09-29 | Discharge: 2013-09-29 | Disposition: A | Discharge status: Home / Self Care | Payer: Medicare Other | Source: Ambulatory Visit | Attending: Vascular Surgery | Admitting: Vascular Surgery

## 2013-09-29 ENCOUNTER — Encounter: Payer: Self-pay | Admitting: Vascular Surgery

## 2013-09-29 ENCOUNTER — Ambulatory Visit (INDEPENDENT_AMBULATORY_CARE_PROVIDER_SITE_OTHER): Payer: Self-pay | Admitting: Vascular Surgery

## 2013-09-29 VITALS — BP 179/86 | HR 90 | Resp 18 | Ht 69.0 in | Wt 248.0 lb

## 2013-09-29 DIAGNOSIS — N186 End stage renal disease: Secondary | ICD-10-CM

## 2013-09-29 DIAGNOSIS — I742 Embolism and thrombosis of arteries of the upper extremities: Secondary | ICD-10-CM | POA: Insufficient documentation

## 2013-09-29 DIAGNOSIS — R209 Unspecified disturbances of skin sensation: Secondary | ICD-10-CM

## 2013-09-29 DIAGNOSIS — Z48812 Encounter for surgical aftercare following surgery on the circulatory system: Secondary | ICD-10-CM | POA: Insufficient documentation

## 2013-09-29 DIAGNOSIS — R200 Anesthesia of skin: Secondary | ICD-10-CM | POA: Insufficient documentation

## 2013-09-29 NOTE — Progress Notes (Signed)
Subjective:     Patient ID: Ernest [name], male   DOB: October 06, 1946, 67 y.o.   MRN: [id number]  HPI stage renal disease returns 5 weeks post right transbronchial embolectomy of radial and ulnar arteries. He has a fistula which is patent in the right upper arm nonfunctional and is currently being dialyzed through a graft in the left upper arm. His hand has felt back to its normal baseline since the embolectomy procedure. He has dialysis Monday Wednesday and Friday.   Review of Systems     Objective:   Physical Exam BP 179/86  Pulse 90  Resp 18  Ht 5\' 9"  (1.753 m)  Wt 248 lb (112.492 kg)  BMI 36.61 kg/m2  General well-developed well-nourished male no apparent stress alert and oriented x3 Right upper tremor no 12 healed antecubital wound. Right hand is warm and well-perfused with 1-2+ pulse palpable radial artery. An AV fistula in upper arm continues to have good pulse but no palpable thrill  Today I ordered a duplex scan of the right upper extremity which reveals triphasic flow throughout including the radial and ulnar arteries.     Assessment:     Doing well 5 weeks post embolectomy of right radial and ulnar arteries-transbronchial    Plan:     Return to see [place] on when necessary basis

## 2013-10-14 ENCOUNTER — Ambulatory Visit: Payer: Self-pay | Admitting: Podiatrist

## 2013-10-14 DIAGNOSIS — N186 End stage renal disease: Secondary | ICD-10-CM | POA: Diagnosis not present

## 2013-10-16 DIAGNOSIS — D509 Iron deficiency anemia, unspecified: Secondary | ICD-10-CM | POA: Diagnosis not present

## 2013-10-16 DIAGNOSIS — Z992 Dependence on renal dialysis: Secondary | ICD-10-CM | POA: Diagnosis not present

## 2013-10-16 DIAGNOSIS — N2581 Secondary hyperparathyroidism of renal origin: Secondary | ICD-10-CM | POA: Diagnosis not present

## 2013-10-16 DIAGNOSIS — N186 End stage renal disease: Secondary | ICD-10-CM | POA: Diagnosis not present

## 2013-11-02 DIAGNOSIS — N186 End stage renal disease: Secondary | ICD-10-CM | POA: Diagnosis not present

## 2013-11-02 DIAGNOSIS — N2581 Secondary hyperparathyroidism of renal origin: Secondary | ICD-10-CM | POA: Diagnosis not present

## 2013-11-11 DIAGNOSIS — E1129 Type 2 diabetes mellitus with other diabetic kidney complication: Secondary | ICD-10-CM | POA: Diagnosis not present

## 2013-11-14 DIAGNOSIS — N186 End stage renal disease: Secondary | ICD-10-CM | POA: Diagnosis not present

## 2013-11-16 DIAGNOSIS — D509 Iron deficiency anemia, unspecified: Secondary | ICD-10-CM | POA: Diagnosis not present

## 2013-11-16 DIAGNOSIS — N2581 Secondary hyperparathyroidism of renal origin: Secondary | ICD-10-CM | POA: Diagnosis not present

## 2013-11-16 DIAGNOSIS — N186 End stage renal disease: Secondary | ICD-10-CM | POA: Diagnosis not present

## 2013-11-19 DIAGNOSIS — G5600 Carpal tunnel syndrome, unspecified upper limb: Secondary | ICD-10-CM | POA: Diagnosis not present

## 2013-12-12 DIAGNOSIS — N186 End stage renal disease: Secondary | ICD-10-CM | POA: Diagnosis not present

## 2013-12-14 ENCOUNTER — Encounter: Payer: Self-pay | Admitting: Vascular Surgery

## 2013-12-14 DIAGNOSIS — D509 Iron deficiency anemia, unspecified: Secondary | ICD-10-CM | POA: Diagnosis not present

## 2013-12-14 DIAGNOSIS — N2581 Secondary hyperparathyroidism of renal origin: Secondary | ICD-10-CM | POA: Diagnosis not present

## 2013-12-14 DIAGNOSIS — N186 End stage renal disease: Secondary | ICD-10-CM | POA: Diagnosis not present

## 2013-12-15 ENCOUNTER — Ambulatory Visit (INDEPENDENT_AMBULATORY_CARE_PROVIDER_SITE_OTHER): Payer: Medicare Other | Admitting: Vascular Surgery

## 2013-12-15 ENCOUNTER — Other Ambulatory Visit (HOSPITAL_COMMUNITY): Payer: Medicare Other

## 2013-12-15 ENCOUNTER — Other Ambulatory Visit: Payer: Self-pay | Admitting: *Deleted

## 2013-12-15 ENCOUNTER — Encounter: Payer: Self-pay | Admitting: *Deleted

## 2013-12-15 ENCOUNTER — Encounter: Payer: Self-pay | Admitting: Vascular Surgery

## 2013-12-15 VITALS — BP 170/80 | HR 72 | Resp 16 | Ht 69.0 in | Wt 253.5 lb

## 2013-12-15 DIAGNOSIS — N186 End stage renal disease: Secondary | ICD-10-CM

## 2013-12-15 DIAGNOSIS — M79609 Pain in unspecified limb: Secondary | ICD-10-CM | POA: Diagnosis not present

## 2013-12-15 NOTE — Progress Notes (Signed)
Here today for evaluation of painful right upper arm AV fistula site. The patient is currently dialyzing via a left upper arm AV graft. He has had multiple prior bilateral access. His right arm has not been used for quite some time. He reports that he has pain over the area in his mid biceps over the venous fistula. He reports a throbbing sensation here and also a dull ache. He also reports some itching at the antecubital space.  Past Medical History  Diagnosis Date  . Hyperlipidemia   . Retinopathy   . Gout   . ESRD (end stage renal disease)     Started HD in [place] in 2009, ESRD was due to DM. Moved to [facility] in Dec 2009 and now gets dialysis at [facility] on a MWF schedule.     . Type II or unspecified type diabetes mellitus without mention of complication, not stated as uncontrolled     adult onset  . Erectile dysfunction     penile implant  . Abnormal stress test     s/p cath November 2013 with modest disease involving the ostial left main, proximal LAD, proximal RCA - do not appear to be hemodynamically signficant; mild LV dysfunction  . Obesity   . Hypertension   . Hepatitis C   . COPD (chronic obstructive pulmonary disease)   . Coronary artery disease     per cath report 2013  . Ejection fraction < 50%   . Chronic systolic CHF (congestive heart failure)   . Bacteremia     History  Substance Use Topics  . Smoking status: Former Smoker -- 7 years    Quit date: 06/10/1973  . Smokeless tobacco: Never Used  . Alcohol Use: No    Family History  Problem Relation Age of Onset  . Heart disease Father     [name]  Current outpatient prescriptions:albuterol (PROVENTIL HFA;VENTOLIN HFA) 108 (90 BASE) MCG/ACT inhaler, Inhale 2 puffs into the lungs every 12 (twelve) hours as needed for wheezing. , Disp: , Rfl: ;  allopurinol (ZYLOPRIM) 100 MG tablet, Take 100 mg by mouth daily. , Disp: , Rfl: ;  amitriptyline (ELAVIL) 25 MG tablet, Take 25 mg by mouth at bedtime as needed  for sleep. , Disp: , Rfl:  aspirin EC 81 MG tablet, Take 1 tablet (81 mg total) by mouth daily., Disp: 90 tablet, Rfl: 3;  Calcium Acetate 667 MG TABS, Take 1 tablet by mouth 3 (three) times daily with meals. , Disp: , Rfl: ;  cinacalcet (SENSIPAR) 60 MG tablet, Take 120 mg by mouth 2 (two) times daily. , Disp: , Rfl: ;  clopidogrel (PLAVIX) 75 MG tablet, Take 75 mg by mouth daily. , Disp: , Rfl:  colchicine 0.6 MG tablet, Take 0.6 mg by mouth daily as needed (for gout flare-ups). , Disp: , Rfl: ;  insulin NPH (HUMULIN N,NOVOLIN N) 100 UNIT/ML injection, Inject 20 Units into the skin 2 (two) times daily. , Disp: , Rfl: ;  insulin regular (NOVOLIN R,HUMULIN R) 100 units/mL injection, Inject 40 Units into the skin 3 (three) times daily before meals. , Disp: , Rfl: ;  losartan (COZAAR) 50 MG tablet, Take 50 mg by mouth daily., Disp: , Rfl:  metoprolol (LOPRESSOR) 50 MG tablet, Take 50 mg by mouth every morning. , Disp: , Rfl: ;  multivitamin (RENA-VIT) TABS tablet, Take 1 tablet by mouth daily.  , Disp: , Rfl: ;  oxyCODONE (OXY IR/ROXICODONE) 5 MG immediate release tablet, Take  10 mg by mouth daily as needed for severe pain., Disp: , Rfl: ;  penicillin v potassium (VEETID) 500 MG tablet, Take 1 tablet (500 mg total) by mouth 3 (three) times daily., Disp: 30 tablet, Rfl: 0 sevelamer (RENVELA) 800 MG tablet, Take 2,400 mg by mouth 3 (three) times daily with meals. , Disp: , Rfl: ;  simvastatin (ZOCOR) 20 MG tablet, Take 20 mg by mouth every morning. , Disp: , Rfl:   BP 170/80  Pulse 72  Resp 16  Ht 5\' 9"  (1.753 m)  Wt 253 lb 8 oz (114.987 kg)  BMI 37.42 kg/m2  Body mass index is 37.42 kg/(m^2).       On physical exam he is well-developed well-nourished gentleman in no acute distress His left upper arm AV graft is functioning quite nicely with no evidence of erythema or false aneurysm. He is a good thrill. On his right arm he does have cheloid formation of the actual fistula itself. He does not have  a bruit over this. There is a very pulsatile nature throughout the fistula with apparently no venous outflow.  Impression and plan pain associated with nonfunctional right upper arm AV fistula. I discussed this at length with the patient. I explained the only option would be ligation of his fistula and resection of the venous portion of the antecubital space in the same incision. I explained that decreasing his venous pressure in the nonfunctional fistula may improve his discomfort but would be impossible to determine without ligation. He is very uncomfortable with this and wished to proceed with this treatment. We will schedule this as an outpatient at his earliest convenience

## 2013-12-16 ENCOUNTER — Ambulatory Visit: Payer: BC Managed Care – PPO | Admitting: Podiatrist

## 2013-12-24 ENCOUNTER — Ambulatory Visit: Payer: BC Managed Care – PPO | Admitting: Podiatrist

## 2013-12-28 ENCOUNTER — Encounter (HOSPITAL_COMMUNITY): Payer: Self-pay | Admitting: Pharmacy Technician

## 2013-12-29 ENCOUNTER — Encounter (HOSPITAL_COMMUNITY): Payer: Self-pay | Admitting: *Deleted

## 2013-12-29 MED ORDER — DEXTROSE 5 % IV SOLN
1.5000 g | INTRAVENOUS | Status: AC
  Administered 2013-12-30: 1.5 g via INTRAVENOUS
  Filled 2013-12-29: qty 1.5

## 2013-12-29 NOTE — Progress Notes (Signed)
12/29/13 1740  OBSTRUCTIVE SLEEP APNEA  Have you ever been diagnosed with sleep apnea through a sleep study? No  Do you snore loudly (loud enough to be heard through closed doors)?  0  Do you often feel tired, fatigued, or sleepy during the daytime? 1  Has anyone observed you stop breathing during your sleep? 0  Do you have, or are you being treated for high blood pressure? 1  BMI more than 35 kg/m2? 1  Age over 66 years old? 1  Neck circumference greater than 40 cm/18 inches? 1  Gender: 1  Obstructive Sleep Apnea Score 6

## 2013-12-30 ENCOUNTER — Encounter (HOSPITAL_COMMUNITY): Payer: Medicare Other | Admitting: Anesthesiology

## 2013-12-30 ENCOUNTER — Ambulatory Visit (HOSPITAL_COMMUNITY): Payer: Medicare Other | Admitting: Anesthesiology

## 2013-12-30 ENCOUNTER — Encounter (HOSPITAL_COMMUNITY): Payer: Self-pay | Admitting: *Deleted

## 2013-12-30 ENCOUNTER — Ambulatory Visit (HOSPITAL_COMMUNITY): Payer: Medicare Other

## 2013-12-30 ENCOUNTER — Encounter (HOSPITAL_COMMUNITY)
Admission: RE | Disposition: A | Discharge status: Home / Self Care | Payer: Self-pay | Source: Ambulatory Visit | Attending: Vascular Surgery

## 2013-12-30 ENCOUNTER — Ambulatory Visit (HOSPITAL_COMMUNITY)
Admission: RE | Admit: 2013-12-30 | Discharge: 2013-12-30 | Disposition: A | Discharge status: Home / Self Care | Payer: Medicare Other | Source: Ambulatory Visit | Attending: Vascular Surgery | Admitting: Vascular Surgery

## 2013-12-30 DIAGNOSIS — N186 End stage renal disease: Secondary | ICD-10-CM | POA: Diagnosis not present

## 2013-12-30 DIAGNOSIS — Z992 Dependence on renal dialysis: Secondary | ICD-10-CM | POA: Diagnosis not present

## 2013-12-30 DIAGNOSIS — E119 Type 2 diabetes mellitus without complications: Secondary | ICD-10-CM | POA: Diagnosis not present

## 2013-12-30 DIAGNOSIS — J4489 Other specified chronic obstructive pulmonary disease: Secondary | ICD-10-CM | POA: Insufficient documentation

## 2013-12-30 DIAGNOSIS — T82598A Other mechanical complication of other cardiac and vascular devices and implants, initial encounter: Secondary | ICD-10-CM | POA: Diagnosis present

## 2013-12-30 DIAGNOSIS — Z01818 Encounter for other preprocedural examination: Secondary | ICD-10-CM | POA: Diagnosis not present

## 2013-12-30 DIAGNOSIS — I2510 Atherosclerotic heart disease of native coronary artery without angina pectoris: Secondary | ICD-10-CM | POA: Insufficient documentation

## 2013-12-30 DIAGNOSIS — I10 Essential (primary) hypertension: Secondary | ICD-10-CM | POA: Diagnosis not present

## 2013-12-30 DIAGNOSIS — B1920 Unspecified viral hepatitis C without hepatic coma: Secondary | ICD-10-CM | POA: Diagnosis not present

## 2013-12-30 DIAGNOSIS — E669 Obesity, unspecified: Secondary | ICD-10-CM | POA: Insufficient documentation

## 2013-12-30 DIAGNOSIS — Z794 Long term (current) use of insulin: Secondary | ICD-10-CM | POA: Insufficient documentation

## 2013-12-30 DIAGNOSIS — G4730 Sleep apnea, unspecified: Secondary | ICD-10-CM | POA: Insufficient documentation

## 2013-12-30 DIAGNOSIS — Y832 Surgical operation with anastomosis, bypass or graft as the cause of abnormal reaction of the patient, or of later complication, without mention of misadventure at the time of the procedure: Secondary | ICD-10-CM | POA: Diagnosis not present

## 2013-12-30 DIAGNOSIS — J449 Chronic obstructive pulmonary disease, unspecified: Secondary | ICD-10-CM | POA: Insufficient documentation

## 2013-12-30 DIAGNOSIS — I509 Heart failure, unspecified: Secondary | ICD-10-CM | POA: Insufficient documentation

## 2013-12-30 DIAGNOSIS — T82898A Other specified complication of vascular prosthetic devices, implants and grafts, initial encounter: Secondary | ICD-10-CM | POA: Diagnosis not present

## 2013-12-30 DIAGNOSIS — Z87891 Personal history of nicotine dependence: Secondary | ICD-10-CM | POA: Insufficient documentation

## 2013-12-30 DIAGNOSIS — I120 Hypertensive chronic kidney disease with stage 5 chronic kidney disease or end stage renal disease: Secondary | ICD-10-CM | POA: Insufficient documentation

## 2013-12-30 DIAGNOSIS — M79609 Pain in unspecified limb: Secondary | ICD-10-CM

## 2013-12-30 DIAGNOSIS — I2589 Other forms of chronic ischemic heart disease: Secondary | ICD-10-CM | POA: Insufficient documentation

## 2013-12-30 HISTORY — DX: Gout, unspecified: M10.9

## 2013-12-30 HISTORY — PX: LIGATION OF ARTERIOVENOUS  FISTULA: SHX5948

## 2013-12-30 LAB — GLUCOSE, CAPILLARY
GLUCOSE-CAPILLARY: 155 mg/dL — AB (ref 70–99)
Glucose-Capillary: 130 mg/dL — ABNORMAL HIGH (ref 70–99)
Glucose-Capillary: 136 mg/dL — ABNORMAL HIGH (ref 70–99)

## 2013-12-30 LAB — POCT I-STAT 4, (NA,K, GLUC, HGB,HCT)
GLUCOSE: 130 mg/dL — AB (ref 70–99)
HEMATOCRIT: 38 % — AB (ref 39.0–52.0)
HEMOGLOBIN: 12.9 g/dL — AB (ref 13.0–17.0)
Potassium: 4.5 mEq/L (ref 3.7–5.3)
Sodium: 140 mEq/L (ref 137–147)

## 2013-12-30 SURGERY — LIGATION OF ARTERIOVENOUS  FISTULA
Anesthesia: Monitor Anesthesia Care | Site: Arm Upper | Laterality: Right

## 2013-12-30 MED ORDER — SODIUM CHLORIDE 0.9 % IV SOLN
INTRAVENOUS | Status: DC
  Administered 2013-12-30: 10 mL/h via INTRAVENOUS

## 2013-12-30 MED ORDER — MIDAZOLAM HCL 2 MG/2ML IJ SOLN: 1.0000 mg | INTRAMUSCULAR | Status: DC | PRN

## 2013-12-30 MED ORDER — SODIUM CHLORIDE 0.9 % IV SOLN
INTRAVENOUS | Status: DC | PRN
  Administered 2013-12-30: 13:00:00 via INTRAVENOUS

## 2013-12-30 MED ORDER — NEOSTIGMINE METHYLSULFATE 1 MG/ML IJ SOLN
INTRAMUSCULAR | Status: AC
Start: 2013-12-30 — End: 2013-12-30
  Filled 2013-12-30: qty 10

## 2013-12-30 MED ORDER — FENTANYL CITRATE 0.05 MG/ML IJ SOLN: 50.0000 ug | Freq: Once | INTRAMUSCULAR | Status: DC

## 2013-12-30 MED ORDER — SODIUM CHLORIDE 0.9 % IR SOLN
Status: DC | PRN
  Administered 2013-12-30: 14:00:00

## 2013-12-30 MED ORDER — PROPOFOL 10 MG/ML IV BOLUS
INTRAVENOUS | Status: AC
  Filled 2013-12-30: qty 20

## 2013-12-30 MED ORDER — LIDOCAINE-EPINEPHRINE (PF) 1 %-1:200000 IJ SOLN
INTRAMUSCULAR | Status: DC | PRN
  Administered 2013-12-30: 30 mL

## 2013-12-30 MED ORDER — MIDAZOLAM HCL 2 MG/2ML IJ SOLN
INTRAMUSCULAR | Status: AC
  Filled 2013-12-30: qty 2

## 2013-12-30 MED ORDER — LIDOCAINE HCL (PF) 1 % IJ SOLN
INTRAMUSCULAR | Status: AC
  Filled 2013-12-30: qty 30

## 2013-12-30 MED ORDER — PROPOFOL INFUSION 10 MG/ML OPTIME
INTRAVENOUS | Status: DC | PRN
  Administered 2013-12-30: 50 ug/kg/min via INTRAVENOUS

## 2013-12-30 MED ORDER — LIDOCAINE HCL (CARDIAC) 20 MG/ML IV SOLN
INTRAVENOUS | Status: DC | PRN
  Administered 2013-12-30: 50 mg via INTRAVENOUS

## 2013-12-30 MED ORDER — FENTANYL CITRATE 0.05 MG/ML IJ SOLN
INTRAMUSCULAR | Status: DC | PRN
  Administered 2013-12-30 (×3): 25 ug via INTRAVENOUS

## 2013-12-30 MED ORDER — FENTANYL CITRATE 0.05 MG/ML IJ SOLN
INTRAMUSCULAR | Status: AC
  Filled 2013-12-30: qty 5

## 2013-12-30 MED ORDER — 0.9 % SODIUM CHLORIDE (POUR BTL) OPTIME
TOPICAL | Status: DC | PRN
  Administered 2013-12-30: 1000 mL

## 2013-12-30 MED ORDER — ONDANSETRON HCL 4 MG/2ML IJ SOLN
INTRAMUSCULAR | Status: AC
  Filled 2013-12-30: qty 2

## 2013-12-30 MED ORDER — SODIUM CHLORIDE 0.9 % IV SOLN: INTRAVENOUS | Status: DC

## 2013-12-30 MED ORDER — OXYCODONE-ACETAMINOPHEN 5-325 MG PO TABS: 1.0000 | ORAL_TABLET | Freq: Four times a day (QID) | ORAL | Status: DC | PRN

## 2013-12-30 MED ORDER — CHLORHEXIDINE GLUCONATE CLOTH 2 % EX PADS: 6.0000 | MEDICATED_PAD | Freq: Once | CUTANEOUS | Status: DC

## 2013-12-30 MED ORDER — GLYCOPYRROLATE 0.2 MG/ML IJ SOLN
INTRAMUSCULAR | Status: AC
  Filled 2013-12-30: qty 4

## 2013-12-30 SURGICAL SUPPLY — 45 items
BENZOIN TINCTURE PRP APPL 2/3 (GAUZE/BANDAGES/DRESSINGS) ×6 IMPLANT
CANISTER SUCTION 2500CC (MISCELLANEOUS) ×3 IMPLANT
CLIP LIGATING EXTRA MED SLVR (CLIP) IMPLANT
CLIP LIGATING EXTRA SM BLUE (MISCELLANEOUS) IMPLANT
CLIP TI MEDIUM 24 (CLIP) ×3 IMPLANT
CLIP TI WIDE RED SMALL 24 (CLIP) ×3 IMPLANT
CLOSURE WOUND 1/2 X4 (GAUZE/BANDAGES/DRESSINGS) ×1
COVER SURGICAL LIGHT HANDLE (MISCELLANEOUS) ×3 IMPLANT
DECANTER SPIKE VIAL GLASS SM (MISCELLANEOUS) IMPLANT
DERMABOND ADVANCED (GAUZE/BANDAGES/DRESSINGS) ×2
DERMABOND ADVANCED .7 DNX12 (GAUZE/BANDAGES/DRESSINGS) ×1 IMPLANT
ELECT REM PT RETURN 9FT ADLT (ELECTROSURGICAL) ×3
ELECTRODE REM PT RTRN 9FT ADLT (ELECTROSURGICAL) ×1 IMPLANT
GEL ULTRASOUND 20GR AQUASONIC (MISCELLANEOUS) ×3 IMPLANT
GLOVE BIOGEL PI IND STRL 6 (GLOVE) ×1 IMPLANT
GLOVE BIOGEL PI IND STRL 6.5 (GLOVE) ×1 IMPLANT
GLOVE BIOGEL PI IND STRL 7.5 (GLOVE) ×1 IMPLANT
GLOVE BIOGEL PI INDICATOR 6 (GLOVE) ×2
GLOVE BIOGEL PI INDICATOR 6.5 (GLOVE) ×2
GLOVE BIOGEL PI INDICATOR 7.5 (GLOVE) ×2
GLOVE SS BIOGEL STRL SZ 7 (GLOVE) ×1 IMPLANT
GLOVE SS BIOGEL STRL SZ 7.5 (GLOVE) IMPLANT
GLOVE SUPERSENSE BIOGEL SZ 7 (GLOVE) ×2
GLOVE SUPERSENSE BIOGEL SZ 7.5 (GLOVE)
GOWN STRL REUS W/ TWL LRG LVL3 (GOWN DISPOSABLE) ×3 IMPLANT
GOWN STRL REUS W/TWL LRG LVL3 (GOWN DISPOSABLE) ×6
KIT BASIN OR (CUSTOM PROCEDURE TRAY) ×3 IMPLANT
KIT ROOM TURNOVER OR (KITS) ×3 IMPLANT
NS IRRIG 1000ML POUR BTL (IV SOLUTION) ×3 IMPLANT
PACK CV ACCESS (CUSTOM PROCEDURE TRAY) ×3 IMPLANT
PAD ARMBOARD 7.5X6 YLW CONV (MISCELLANEOUS) ×6 IMPLANT
SPONGE GAUZE 4X4 12PLY (GAUZE/BANDAGES/DRESSINGS) ×3 IMPLANT
STAPLER VISISTAT 35W (STAPLE) IMPLANT
STRIP CLOSURE SKIN 1/2X4 (GAUZE/BANDAGES/DRESSINGS) ×2 IMPLANT
SUT ETHILON 3 0 PS 1 (SUTURE) ×3 IMPLANT
SUT PROLENE 5 0 C 1 36 (SUTURE) ×3 IMPLANT
SUT PROLENE 6 0 C 1 30 (SUTURE) ×3 IMPLANT
SUT PROLENE 6 0 CC (SUTURE) IMPLANT
SUT SILK 0 TIES 10X30 (SUTURE) IMPLANT
SUT VIC AB 3-0 SH 27 (SUTURE) ×2
SUT VIC AB 3-0 SH 27X BRD (SUTURE) ×1 IMPLANT
TOWEL OR 17X24 6PK STRL BLUE (TOWEL DISPOSABLE) ×3 IMPLANT
TOWEL OR 17X26 10 PK STRL BLUE (TOWEL DISPOSABLE) ×3 IMPLANT
UNDERPAD 30X30 INCONTINENT (UNDERPADS AND DIAPERS) ×3 IMPLANT
WATER STERILE IRR 1000ML POUR (IV SOLUTION) ×3 IMPLANT

## 2013-12-30 NOTE — Progress Notes (Signed)
Spoke with Pamala Hurry at [facility] in [place] pt instructed to be at center at 84;30 in am 12/31/2013 for dialysis pt told and wife told understands time and date

## 2013-12-30 NOTE — Anesthesia Postprocedure Evaluation (Signed)
  Anesthesia Post-op Note  Patient: Ernest [name]  Procedure(s) Performed: Procedure(s): REMOVAL OF SEGMENT OF GORTEX GRAFT AND FISTULA  AND REPAIR OF BRACHIAL ARTERY (Right)  Patient Location: PACU  Anesthesia Type:MAC  Level of Consciousness: awake  Airway and Oxygen Therapy: Patient Spontanous Breathing  Post-op Pain: mild  Post-op Assessment: Post-op Vital signs reviewed, Patient's Cardiovascular Status Stable, Respiratory Function Stable, Patent Airway, No signs of Nausea or vomiting and Pain level controlled  Post-op Vital Signs: Reviewed and stable  Complications: No apparent anesthesia complications

## 2013-12-30 NOTE — Progress Notes (Signed)
SPOKE TO DR. EARLY RE WHERE IV COULD BE PLACED AND HE STATED EITHER HAND WOULD BE OKAY.

## 2013-12-30 NOTE — Preoperative (Signed)
Beta Blockers   Reason not to administer Beta Blockers:Not Applicable, took this AM

## 2013-12-30 NOTE — Op Note (Signed)
OPERATIVE REPORT  Date of Surgery: 12/30/2013  Surgeon: Tinnie Gens, MD  Assistant: Nurse  Pre-op Diagnosis: Painful-nonfunctional-AV graft/fistula right upper arm  Post-op Diagnosis: Same  Procedure: Procedure(s): REMOVAL OF SEGMENT OF GORTEX GRAFT AND FISTULA  AND REPAIR OF BRACHIAL ARTERY  Anesthesia: General  EBL: 0  Complications: None  The patient was taken to the operating room placed in the supine position at which time the right upper extremity was prepped with Betadine scrub and solution draped in routine sterile manner. There was a partially functioning access in the right upper arm. He was from the brachial to cephalic. There was a large keloid in the distal upper arm involving the skin overlying the most proximal portion of the graft near the brachial artery. After infiltration of 1% Xylocaine with epinephrine a elliptical incision was made to excise the keloid. This was carried down to subcutaneous tissue and there was about a 6 or 7 cm segment of 6 mm Gore-Tex anastomosed to the brachial artery and anastomosed end-to-end to the cephalic vein the rest of the access was native vein. It was diffusely diseased however had not been functional for some time there was excess and the other upper extremity. This was all dissected free and the brachial artery was exposed proximally and distally for control. Brachial artery was then clamp   temporarily proximally and distally the graft transected leaving a small 3 mm cuff on the brachial artery was excellent inflow and backbleeding. This was oversewn with 2 layers of 6-0 Prolene to serve as a patch clamps released there was good Doppler flow in the brachial artery and distally. The entire piece of Gore-Tex was removed and some of the vein adjacent to this with the access being ligated with a #1 silk tie more distally. The skin was then mobilized with slight flaps to allow for easy closure after adequate hemostasis was achieved the wound  was closed in 3 layers with 3-0 Vicryl in a subcuticular fashion and Dermabond patient to recovery in stable condition Procedure Details:   Tinnie Gens, MD 12/30/2013 2:24 PM

## 2013-12-30 NOTE — Interval H&P Note (Signed)
History and Physical Interval Note:  12/30/2013 10:43 AM  Ernest [name]  has presented today for surgery, with the diagnosis of ESRD  The various methods of treatment have been discussed with the patient and family. After consideration of risks, benefits and other options for treatment, the patient has consented to  Procedure(s): LIGATION OF ARTERIOVENOUS  FISTULA RIGHT UPPER ARM (Right) as a surgical intervention .  The patient's history has been reviewed, patient examined, no change in status, stable for surgery.  I have reviewed the patient's chart and labs.  Questions were answered to the patient's satisfaction.     Tinnie Gens

## 2013-12-30 NOTE — Interval H&P Note (Signed)
History and Physical Interval Note:  12/30/2013 8:42 AM  Ernest [name]  has presented today for surgery, with the diagnosis of ESRD  The various methods of treatment have been discussed with the patient and family. After consideration of risks, benefits and other options for treatment, the patient has consented to  Procedure(s): LIGATION OF ARTERIOVENOUS  FISTULA RIGHT UPPER ARM (Right) as a surgical intervention .  The patient's history has been reviewed, patient examined, no change in status, stable for surgery.  I have reviewed the patient's chart and labs.  Questions were answered to the patient's satisfaction.     EARLY, TODD

## 2013-12-30 NOTE — Anesthesia Preprocedure Evaluation (Signed)
Anesthesia Evaluation  Patient identified by MRN, date of birth, ID band Patient awake    Reviewed: Allergy & Precautions, H&P , NPO status , Patient's Chart, lab work & pertinent test results  Airway Mallampati: II TM Distance: >3 FB Neck ROM: Full    Dental  (+) Teeth Intact, Dental Advisory Given, Edentulous Upper   Pulmonary shortness of breath, sleep apnea , COPDformer smoker,  breath sounds clear to auscultation        Cardiovascular hypertension, Pt. on medications + CAD and +CHF Rhythm:Regular Rate:Normal  Ischemic cardiomyopathy   Neuro/Psych    GI/Hepatic (+) Hepatitis -, C  Endo/Other  diabetes, Well Controlled, Type 2, Insulin Dependent  Renal/GU ESRFRenal disease     Musculoskeletal   Abdominal (+) + obese,   Peds  Hematology   Anesthesia Other Findings   Reproductive/Obstetrics                           Anesthesia Physical Anesthesia Plan  ASA: III  Anesthesia Plan: MAC   Post-op Pain Management:    Induction: Intravenous  Airway Management Planned: Simple Face Mask  Additional Equipment:   Intra-op Plan:   Post-operative Plan:   Informed Consent: I have reviewed the patients History and Physical, chart, labs and discussed the procedure including the risks, benefits and alternatives for the proposed anesthesia with the patient or authorized representative who has indicated his/her understanding and acceptance.     Plan Discussed with: CRNA and Surgeon  Anesthesia Plan Comments:         Anesthesia Quick Evaluation

## 2013-12-30 NOTE — H&P (View-Only) (Signed)
Here today for evaluation of painful right upper arm AV fistula site. The patient is currently dialyzing via a left upper arm AV graft. He has had multiple prior bilateral access. His right arm has not been used for quite some time. He reports that he has pain over the area in his mid biceps over the venous fistula. He reports a throbbing sensation here and also a dull ache. He also reports some itching at the antecubital space.  Past Medical History  Diagnosis Date  . Hyperlipidemia   . Retinopathy   . Gout   . ESRD (end stage renal disease)     Started HD in [place] in 2009, ESRD was due to DM. Moved to [facility] in Dec 2009 and now gets dialysis at [facility] on a MWF schedule.     . Type II or unspecified type diabetes mellitus without mention of complication, not stated as uncontrolled     adult onset  . Erectile dysfunction     penile implant  . Abnormal stress test     s/p cath November 2013 with modest disease involving the ostial left main, proximal LAD, proximal RCA - do not appear to be hemodynamically signficant; mild LV dysfunction  . Obesity   . Hypertension   . Hepatitis C   . COPD (chronic obstructive pulmonary disease)   . Coronary artery disease     per cath report 2013  . Ejection fraction < 50%   . Chronic systolic CHF (congestive heart failure)   . Bacteremia     History  Substance Use Topics  . Smoking status: Former Smoker -- 7 years    Quit date: 06/10/1973  . Smokeless tobacco: Never Used  . Alcohol Use: No    Family History  Problem Relation Age of Onset  . Heart disease Father     [name]  Current outpatient prescriptions:albuterol (PROVENTIL HFA;VENTOLIN HFA) 108 (90 BASE) MCG/ACT inhaler, Inhale 2 puffs into the lungs every 12 (twelve) hours as needed for wheezing. , Disp: , Rfl: ;  allopurinol (ZYLOPRIM) 100 MG tablet, Take 100 mg by mouth daily. , Disp: , Rfl: ;  amitriptyline (ELAVIL) 25 MG tablet, Take 25 mg by mouth at bedtime as needed  for sleep. , Disp: , Rfl:  aspirin EC 81 MG tablet, Take 1 tablet (81 mg total) by mouth daily., Disp: 90 tablet, Rfl: 3;  Calcium Acetate 667 MG TABS, Take 1 tablet by mouth 3 (three) times daily with meals. , Disp: , Rfl: ;  cinacalcet (SENSIPAR) 60 MG tablet, Take 120 mg by mouth 2 (two) times daily. , Disp: , Rfl: ;  clopidogrel (PLAVIX) 75 MG tablet, Take 75 mg by mouth daily. , Disp: , Rfl:  colchicine 0.6 MG tablet, Take 0.6 mg by mouth daily as needed (for gout flare-ups). , Disp: , Rfl: ;  insulin NPH (HUMULIN N,NOVOLIN N) 100 UNIT/ML injection, Inject 20 Units into the skin 2 (two) times daily. , Disp: , Rfl: ;  insulin regular (NOVOLIN R,HUMULIN R) 100 units/mL injection, Inject 40 Units into the skin 3 (three) times daily before meals. , Disp: , Rfl: ;  losartan (COZAAR) 50 MG tablet, Take 50 mg by mouth daily., Disp: , Rfl:  metoprolol (LOPRESSOR) 50 MG tablet, Take 50 mg by mouth every morning. , Disp: , Rfl: ;  multivitamin (RENA-VIT) TABS tablet, Take 1 tablet by mouth daily.  , Disp: , Rfl: ;  oxyCODONE (OXY IR/ROXICODONE) 5 MG immediate release tablet, Take  10 mg by mouth daily as needed for severe pain., Disp: , Rfl: ;  penicillin v potassium (VEETID) 500 MG tablet, Take 1 tablet (500 mg total) by mouth 3 (three) times daily., Disp: 30 tablet, Rfl: 0 sevelamer (RENVELA) 800 MG tablet, Take 2,400 mg by mouth 3 (three) times daily with meals. , Disp: , Rfl: ;  simvastatin (ZOCOR) 20 MG tablet, Take 20 mg by mouth every morning. , Disp: , Rfl:   BP 170/80  Pulse 72  Resp 16  Ht 5\' 9"  (1.753 m)  Wt 253 lb 8 oz (114.987 kg)  BMI 37.42 kg/m2  Body mass index is 37.42 kg/(m^2).       On physical exam he is well-developed well-nourished gentleman in no acute distress His left upper arm AV graft is functioning quite nicely with no evidence of erythema or false aneurysm. He is a good thrill. On his right arm he does have cheloid formation of the actual fistula itself. He does not have  a bruit over this. There is a very pulsatile nature throughout the fistula with apparently no venous outflow.  Impression and plan pain associated with nonfunctional right upper arm AV fistula. I discussed this at length with the patient. I explained the only option would be ligation of his fistula and resection of the venous portion of the antecubital space in the same incision. I explained that decreasing his venous pressure in the nonfunctional fistula may improve his discomfort but would be impossible to determine without ligation. He is very uncomfortable with this and wished to proceed with this treatment. We will schedule this as an outpatient at his earliest convenience

## 2013-12-30 NOTE — Transfer of Care (Signed)
Immediate Anesthesia Transfer of Care Note  Patient: Ernest [name]  Procedure(s) Performed: Procedure(s): REMOVAL OF SEGMENT OF GORTEX GRAFT AND FISTULA  AND REPAIR OF BRACHIAL ARTERY (Right)  Patient Location: PACU  Anesthesia Type:MAC  Level of Consciousness: awake, alert , oriented, patient cooperative and responds to stimulation  Airway & Oxygen Therapy: Patient Spontanous Breathing  Post-op Assessment: Report given to PACU RN, Post -op Vital signs reviewed and stable and Patient moving all extremities X 4  Post vital signs: Reviewed and stable  Complications: No apparent anesthesia complications

## 2014-01-01 ENCOUNTER — Encounter (HOSPITAL_COMMUNITY): Payer: Self-pay | Admitting: Vascular Surgery

## 2014-01-07 DIAGNOSIS — N186 End stage renal disease: Secondary | ICD-10-CM | POA: Diagnosis not present

## 2014-01-07 DIAGNOSIS — E7800 Pure hypercholesterolemia, unspecified: Secondary | ICD-10-CM | POA: Diagnosis not present

## 2014-01-07 DIAGNOSIS — IMO0001 Reserved for inherently not codable concepts without codable children: Secondary | ICD-10-CM | POA: Diagnosis not present

## 2014-01-07 DIAGNOSIS — I10 Essential (primary) hypertension: Secondary | ICD-10-CM | POA: Diagnosis not present

## 2014-01-12 DIAGNOSIS — N186 End stage renal disease: Secondary | ICD-10-CM | POA: Diagnosis not present

## 2014-01-13 DIAGNOSIS — D509 Iron deficiency anemia, unspecified: Secondary | ICD-10-CM | POA: Diagnosis not present

## 2014-01-13 DIAGNOSIS — N186 End stage renal disease: Secondary | ICD-10-CM | POA: Diagnosis not present

## 2014-01-13 DIAGNOSIS — N039 Chronic nephritic syndrome with unspecified morphologic changes: Secondary | ICD-10-CM | POA: Diagnosis not present

## 2014-01-13 DIAGNOSIS — D631 Anemia in chronic kidney disease: Secondary | ICD-10-CM | POA: Diagnosis not present

## 2014-01-13 DIAGNOSIS — E119 Type 2 diabetes mellitus without complications: Secondary | ICD-10-CM | POA: Diagnosis not present

## 2014-01-13 DIAGNOSIS — N2581 Secondary hyperparathyroidism of renal origin: Secondary | ICD-10-CM | POA: Diagnosis not present

## 2014-01-27 ENCOUNTER — Encounter (HOSPITAL_COMMUNITY): Payer: Self-pay | Admitting: Emergency Medicine

## 2014-01-27 ENCOUNTER — Emergency Department (HOSPITAL_COMMUNITY)
Admission: EM | Admit: 2014-01-27 | Discharge: 2014-01-27 | Disposition: A | Discharge status: Home / Self Care | Payer: Medicare Other | Attending: Emergency Medicine | Admitting: Emergency Medicine

## 2014-01-27 DIAGNOSIS — I10 Essential (primary) hypertension: Secondary | ICD-10-CM | POA: Diagnosis not present

## 2014-01-27 DIAGNOSIS — R61 Generalized hyperhidrosis: Secondary | ICD-10-CM | POA: Diagnosis not present

## 2014-01-27 DIAGNOSIS — F29 Unspecified psychosis not due to a substance or known physiological condition: Secondary | ICD-10-CM | POA: Insufficient documentation

## 2014-01-27 DIAGNOSIS — Z8669 Personal history of other diseases of the nervous system and sense organs: Secondary | ICD-10-CM | POA: Insufficient documentation

## 2014-01-27 DIAGNOSIS — Z9889 Other specified postprocedural states: Secondary | ICD-10-CM | POA: Diagnosis not present

## 2014-01-27 DIAGNOSIS — E785 Hyperlipidemia, unspecified: Secondary | ICD-10-CM | POA: Diagnosis not present

## 2014-01-27 DIAGNOSIS — E1169 Type 2 diabetes mellitus with other specified complication: Secondary | ICD-10-CM | POA: Diagnosis not present

## 2014-01-27 DIAGNOSIS — I5022 Chronic systolic (congestive) heart failure: Secondary | ICD-10-CM | POA: Diagnosis not present

## 2014-01-27 DIAGNOSIS — J449 Chronic obstructive pulmonary disease, unspecified: Secondary | ICD-10-CM | POA: Diagnosis not present

## 2014-01-27 DIAGNOSIS — N186 End stage renal disease: Secondary | ICD-10-CM | POA: Diagnosis not present

## 2014-01-27 DIAGNOSIS — I2510 Atherosclerotic heart disease of native coronary artery without angina pectoris: Secondary | ICD-10-CM | POA: Diagnosis not present

## 2014-01-27 DIAGNOSIS — M109 Gout, unspecified: Secondary | ICD-10-CM | POA: Diagnosis not present

## 2014-01-27 DIAGNOSIS — Z79899 Other long term (current) drug therapy: Secondary | ICD-10-CM | POA: Diagnosis not present

## 2014-01-27 DIAGNOSIS — Z8619 Personal history of other infectious and parasitic diseases: Secondary | ICD-10-CM | POA: Diagnosis not present

## 2014-01-27 DIAGNOSIS — J4489 Other specified chronic obstructive pulmonary disease: Secondary | ICD-10-CM | POA: Insufficient documentation

## 2014-01-27 DIAGNOSIS — Z87891 Personal history of nicotine dependence: Secondary | ICD-10-CM | POA: Diagnosis not present

## 2014-01-27 DIAGNOSIS — E162 Hypoglycemia, unspecified: Secondary | ICD-10-CM

## 2014-01-27 DIAGNOSIS — Z7982 Long term (current) use of aspirin: Secondary | ICD-10-CM | POA: Diagnosis not present

## 2014-01-27 DIAGNOSIS — I120 Hypertensive chronic kidney disease with stage 5 chronic kidney disease or end stage renal disease: Secondary | ICD-10-CM | POA: Insufficient documentation

## 2014-01-27 DIAGNOSIS — Z7902 Long term (current) use of antithrombotics/antiplatelets: Secondary | ICD-10-CM | POA: Diagnosis not present

## 2014-01-27 DIAGNOSIS — E669 Obesity, unspecified: Secondary | ICD-10-CM | POA: Diagnosis not present

## 2014-01-27 LAB — CBC WITH DIFFERENTIAL/PLATELET
BASOS PCT: 0 % (ref 0–1)
Basophils Absolute: 0 10*3/uL (ref 0.0–0.1)
EOS ABS: 0.2 10*3/uL (ref 0.0–0.7)
EOS PCT: 2 % (ref 0–5)
HEMATOCRIT: 35.5 % — AB (ref 39.0–52.0)
Hemoglobin: 11.9 g/dL — ABNORMAL LOW (ref 13.0–17.0)
Lymphocytes Relative: 20 % (ref 12–46)
Lymphs Abs: 2 10*3/uL (ref 0.7–4.0)
MCH: 31 pg (ref 26.0–34.0)
MCHC: 33.5 g/dL (ref 30.0–36.0)
MCV: 92.4 fL (ref 78.0–100.0)
MONO ABS: 0.6 10*3/uL (ref 0.1–1.0)
MONOS PCT: 6 % (ref 3–12)
Neutro Abs: 6.9 10*3/uL (ref 1.7–7.7)
Neutrophils Relative %: 72 % (ref 43–77)
Platelets: 229 10*3/uL (ref 150–400)
RBC: 3.84 MIL/uL — ABNORMAL LOW (ref 4.22–5.81)
RDW: 14.6 % (ref 11.5–15.5)
WBC: 9.8 10*3/uL (ref 4.0–10.5)

## 2014-01-27 LAB — CBG MONITORING, ED
GLUCOSE-CAPILLARY: 166 mg/dL — AB (ref 70–99)
Glucose-Capillary: 91 mg/dL (ref 70–99)
Glucose-Capillary: 116 mg/dL — ABNORMAL HIGH (ref 70–99)

## 2014-01-27 LAB — COMPREHENSIVE METABOLIC PANEL
ALT: 12 U/L (ref 0–53)
AST: 16 U/L (ref 0–37)
Albumin: 3.2 g/dL — ABNORMAL LOW (ref 3.5–5.2)
Alkaline Phosphatase: 56 U/L (ref 39–117)
BUN: 52 mg/dL — AB (ref 6–23)
CO2: 25 mEq/L (ref 19–32)
CREATININE: 9.49 mg/dL — AB (ref 0.50–1.35)
Calcium: 8.1 mg/dL — ABNORMAL LOW (ref 8.4–10.5)
Chloride: 100 mEq/L (ref 96–112)
GFR calc Af Amer: 6 mL/min — ABNORMAL LOW (ref >90)
GFR calc non Af Amer: 5 mL/min — ABNORMAL LOW (ref >90)
Glucose, Bld: 156 mg/dL — ABNORMAL HIGH (ref 70–99)
Potassium: 5.3 mEq/L (ref 3.7–5.3)
Sodium: 142 mEq/L (ref 137–147)
Total Bilirubin: <0.2 mg/dL — ABNORMAL LOW (ref 0.3–1.2)
Total Protein: 6.4 g/dL (ref 6.0–8.3)

## 2014-01-27 NOTE — ED Provider Notes (Signed)
CSN: [id number]     Arrival date & time 01/27/14  0049 History   First MD Initiated Contact with Patient 01/27/14 0214     Chief Complaint  Patient presents with  . Hypoglycemia     (Consider location/radiation/quality/duration/timing/severity/associated sxs/prior Treatment) HPI Patient has a history of diabetes on insulin. He had his insulin dose increased several weeks ago. He states he's been generally eating less. He denies any recent fevers or chills. Denies any URI or GI symptoms. Patient became diaphoretic around 11 PM yesterday evening. His wife took his blood sugar and it was 68. She states this is low for him. He was given food and symptoms improved. He then woke this morning with similar diaphoresis. Patient's blood sugar again dropped to about 70. He is currently asymptomatic. He denied chest pain at any point. He has no shortness of breath or cough. He makes some urine but denies any dysuria. Past Medical History  Diagnosis Date  . Hyperlipidemia   . Retinopathy   . Gout   . ESRD (end stage renal disease)     Started HD in [place] in 2009, ESRD was due to DM. Moved to [facility] in Dec 2009 and now gets dialysis at [facility] on a MWF schedule.     . Type II or unspecified type diabetes mellitus without mention of complication, not stated as uncontrolled     adult onset  . Erectile dysfunction     penile implant  . Abnormal stress test     s/p cath November 2013 with modest disease involving the ostial left main, proximal LAD, proximal RCA - do not appear to be hemodynamically signficant; mild LV dysfunction  . Obesity   . Hypertension   . Hepatitis C   . COPD (chronic obstructive pulmonary disease)   . Coronary artery disease     per cath report 2013  . Ejection fraction < 50%   . Chronic systolic CHF (congestive heart failure)   . Bacteremia   . Shortness of breath     Hx: of with exertion  . Gout     Hx: of   Past Surgical History  Procedure Laterality Date  . Penile  prosthesis implant    . Fistula      RUE and wrist  . Cardiac catheterization  August 26, 2012  . Eye surgery      laser.  and surgery for DM  . Insertion of dialysis catheter Right 01/01/2013    Procedure: INSERTION OF DIALYSIS CATHETER right  internal jugular;  Surgeon: Serafina Mitchell, MD;  Location: [place];  Service: Vascular;  Laterality: Right;  . Av fistula placement Left 01/13/2013    Procedure: INSERTION OF ARTERIOVENOUS (AV) GORE-TEX GRAFT ARM;  Surgeon: Angelia Mould, MD;  Location: [place];  Service: Vascular;  Laterality: Left;  . Thrombectomy w/ embolectomy Left 03/26/2013    Procedure: Attempted thrombectomy of left arm arteriovenous goretex graft.;  Surgeon: Angelia Mould, MD;  Location: [place];  Service: Vascular;  Laterality: Left;  . Insertion of dialysis catheter N/A 03/26/2013    Procedure: INSERTION OF DIALYSIS CATHETER Left internal jugular vein;  Surgeon: Angelia Mould, MD;  Location: [place];  Service: Vascular;  Laterality: N/A;  . Avgg removal Left 03/26/2013    Procedure: REMOVAL OF  NON INCORPORATED ARTERIOVENOUS GORETEX GRAFT ([place]) left arm * repair of  left brachial artery with vein patch angioplasty.;  Surgeon: Angelia Mould, MD;  Location: [place];  Service:  Vascular;  Laterality: Left;  . Av fistula placement Left 05/05/2013    Procedure: INSERTION OF LEFT UPPER ARM  ARTERIOVENOUS GORTEX GRAFT;  Surgeon: Angelia Mould, MD;  Location: [place];  Service: Vascular;  Laterality: Left;  . Tee without cardioversion N/A 06/11/2013    Procedure: TRANSESOPHAGEAL ECHOCARDIOGRAM (TEE);  Surgeon: Larey Dresser, MD;  Location: [place];  Service: Cardiovascular;  Laterality: N/A;  . Embolectomy Right 08/27/2013    Procedure: EMBOLECTOMY;  Surgeon: Mal Misty, MD;  Location: [place];  Service: Vascular;  Laterality: Right;  Thrombectomy of Radial and ulnar artery.  . Cataract extraction w/ intraocular lens  implant, bilateral    .  Colonoscopy    . Ligation of arteriovenous  fistula Right 12/30/2013    Procedure: REMOVAL OF SEGMENT OF GORTEX GRAFT AND FISTULA  AND REPAIR OF BRACHIAL ARTERY;  Surgeon: Mal Misty, MD;  Location: [facility] OR;  Service: Vascular;  Laterality: Right;   Family History  Problem Relation Age of Onset  . Heart disease Father    History  Substance Use Topics  . Smoking status: Former Smoker -- 7 years    Quit date: 06/10/1973  . Smokeless tobacco: Never Used  . Alcohol Use: No    Review of Systems  Constitutional: Positive for diaphoresis. Negative for fever and chills.  Respiratory: Negative for cough and shortness of breath.   Cardiovascular: Negative for chest pain.  Gastrointestinal: Negative for nausea, vomiting, abdominal pain and diarrhea.  Genitourinary: Negative for dysuria and frequency.  Musculoskeletal: Negative for back pain, neck pain and neck stiffness.  Skin: Negative for rash and wound.  Neurological: Negative for dizziness, syncope, weakness, light-headedness, numbness and headaches.  Psychiatric/Behavioral: Positive for confusion.  All other systems reviewed and are negative.     Allergies  Review of patient's allergies indicates no known allergies.  Home Medications   Prior to Admission medications   Medication Sig Start Date End Date Taking? Authorizing Provider  albuterol (PROVENTIL HFA;VENTOLIN HFA) 108 (90 BASE) MCG/ACT inhaler Inhale 2 puffs into the lungs every 12 (twelve) hours as needed for wheezing.    Yes Historical Provider, MD  allopurinol (ZYLOPRIM) 100 MG tablet Take 100 mg by mouth daily.    Yes Historical Provider, MD  amitriptyline (ELAVIL) 25 MG tablet Take 25 mg by mouth 3 times/day as needed-between meals & bedtime for sleep.    Yes Historical Provider, MD  aspirin EC 81 MG tablet Take 81 mg by mouth daily. 06/18/13  Yes Carlena Bjornstad, MD  Calcium Acetate 667 MG TABS Take 1,334 tablets by mouth 3 (three) times daily with meals.    Yes  Historical Provider, MD  cinacalcet (SENSIPAR) 60 MG tablet Take 60 mg by mouth 2 (two) times daily.    Yes Historical Provider, MD  clopidogrel (PLAVIX) 75 MG tablet Take 75 mg by mouth daily.    Yes Historical Provider, MD  colchicine 0.6 MG tablet Take 0.6 mg by mouth daily as needed (for gout flare-ups).    Yes Historical Provider, MD  insulin NPH (HUMULIN N,NOVOLIN N) 100 UNIT/ML injection Inject 10 Units into the skin 2 (two) times daily.    Yes Historical Provider, MD  insulin regular (NOVOLIN R,HUMULIN R) 100 units/mL injection Inject 35 Units into the skin 3 (three) times daily before meals.    Yes Historical Provider, MD  metoprolol (LOPRESSOR) 50 MG tablet Take 25 mg by mouth every morning.    Yes Historical Provider, MD  multivitamin (RENA-VIT)  TABS tablet Take 1 tablet by mouth daily.    Yes Historical Provider, MD  sevelamer (RENVELA) 800 MG tablet Take 1,600 mg by mouth 3 (three) times daily with meals.    Yes Historical Provider, MD  simvastatin (ZOCOR) 20 MG tablet Take 20 mg by mouth every morning.    Yes Historical Provider, MD   BP 137/79  Pulse 69  Temp(Src) 98.1 F (36.7 C) (Oral)  Resp 16  SpO2 95% Physical Exam  Nursing note and vitals reviewed. Constitutional: He is oriented to person, place, and time. He appears well-developed and well-nourished. No distress.  HENT:  Head: Normocephalic and atraumatic.  Mouth/Throat: Oropharynx is clear and moist.  Eyes: EOM are normal. Pupils are equal, round, and reactive to light.  Neck: Normal range of motion. Neck supple.  Cardiovascular: Normal rate and regular rhythm.   Pulmonary/Chest: Effort normal and breath sounds normal. No respiratory distress. He has no wheezes. He has no rales. He exhibits no tenderness.  Abdominal: Soft. Bowel sounds are normal. He exhibits no distension and no mass. There is no tenderness. There is no rebound and no guarding.  Musculoskeletal: Normal range of motion. He exhibits no edema and no  tenderness.  Neurological: He is alert and oriented to person, place, and time.  Moves all extremities without focal deficit. Sensation is grossly intact.  Skin: Skin is warm and dry. No rash noted. No erythema.  Psychiatric: He has a normal mood and affect. His behavior is normal.    ED Course  Procedures (including critical care time) Labs Review Labs Reviewed  CBC WITH DIFFERENTIAL - Abnormal; Notable for the following:    RBC 3.84 (*)    Hemoglobin 11.9 (*)    HCT 35.5 (*)    All other components within normal limits  COMPREHENSIVE METABOLIC PANEL - Abnormal; Notable for the following:    Glucose, Bld 156 (*)    BUN 52 (*)    Creatinine, Ser 9.49 (*)    Calcium 8.1 (*)    Albumin 3.2 (*)    Total Bilirubin <0.2 (*)    GFR calc non Af Amer 5 (*)    GFR calc Af Amer 6 (*)    All other components within normal limits  CBG MONITORING, ED - Abnormal; Notable for the following:    Glucose-Capillary 116 (*)    All other components within normal limits  URINALYSIS, ROUTINE W REFLEX MICROSCOPIC  CBG MONITORING, ED  CBG MONITORING, ED  CBG MONITORING, ED  CBG MONITORING, ED    Imaging Review No results found.   EKG Interpretation None      MDM   Final diagnoses:  Hypoglycemia    Borderline episodic hypoglycemia. Will check basic labs and urine is patient can't provide a sample. Will monitor closely in the emergency department.  The patient's blood glucose remained stable in the emergency department. Is advised to follow with his primary Dr. for possible insulin adjustment. Return precautions have been given the patient and family voice understanding.  Julianne Rice, MD 01/27/14 [phone]

## 2014-01-27 NOTE — ED Notes (Signed)
Pt aware urine sample is needed. Unable to use the restroom at this time.

## 2014-01-27 NOTE — ED Notes (Signed)
Pt reports this evening he felt dizzy, checked his blood sugar and found it to be 69, pt ate small snack and glucose went up to 140- an hour later pt felt dizzy again and checked glucose found it to be 58.  Pt then ate peanut butter and crackers.  Denies any complaints at this time.

## 2014-01-27 NOTE — ED Notes (Signed)
Pt. reported low blood sugar at home this evening = 70 , pt. stated he is feeling better after eating peanut butter prior to arrival , respirations unlabored / alert and oriented.

## 2014-01-27 NOTE — Discharge Instructions (Signed)
Hypoglycemia (Low Blood Sugar) Hypoglycemia is when the glucose (sugar) in your blood is too low. Hypoglycemia can happen for many reasons. It can happen to people with or without diabetes. Hypoglycemia can develop quickly and can be a medical emergency.  CAUSES  Having hypoglycemia does not mean that you will develop diabetes. Different causes include:  Missed or delayed meals or not enough carbohydrates eaten.  Medication overdose. This could be by accident or deliberate. If by accident, your medication may need to be adjusted or changed.  Exercise or increased activity without adjustments in carbohydrates or medications.  A nerve disorder that affects body functions like your heart rate, blood pressure and digestion (autonomic neuropathy).  A condition where the stomach muscles do not function properly (gastroparesis). Therefore, medications may not absorb properly.  The inability to recognize the signs of hypoglycemia (hypoglycemic unawareness).  Absorption of insulin  may be altered.  Alcohol consumption.  Pregnancy/menstrual cycles/postpartum. This may be due to hormones.  Certain kinds of tumors. This is very rare. SYMPTOMS   Sweating.  Hunger.  Dizziness.  Blurred vision.  Drowsiness.  Weakness.  Headache.  Rapid heart beat.  Shakiness.  Nervousness. DIAGNOSIS  Diagnosis is made by monitoring blood glucose in one or all of the following ways:  Fingerstick blood glucose monitoring.  Laboratory results. TREATMENT  If you think your blood glucose is low:  Check your blood glucose, if possible. If it is less than 70 mg/dl, take one of the following:  3-4 glucose tablets.   cup juice (prefer clear like apple).   cup "regular" soda pop.  1 cup milk.  -1 tube of glucose gel.  5-6 hard candies.  Do not over treat because your blood glucose (sugar) will only go too high.  Wait 15 minutes and recheck your blood glucose. If it is still less than  70 mg/dl (or below your target range), repeat treatment.  Eat a snack if it is more than one hour until your next meal. Sometimes, your blood glucose may go so low that you are unable to treat yourself. You may need someone to help you. You may even pass out or be unable to swallow. This may require you to get an injection of glucagon, which raises the blood glucose. HOME CARE INSTRUCTIONS  Check blood glucose as recommended by your caregiver.  Take medication as prescribed by your caregiver.  Follow your meal plan. Do not skip meals. Eat on time.  If you are going to drink alcohol, drink it only with meals.  Check your blood glucose before driving.  Check your blood glucose before and after exercise. If you exercise longer or different than usual, be sure to check blood glucose more frequently.  Always carry treatment with you. Glucose tablets are the easiest to carry.  Always wear medical alert jewelry or carry some form of identification that states that you have diabetes. This will alert people that you have diabetes. If you have hypoglycemia, they will have a better idea on what to do. SEEK MEDICAL CARE IF:   You are having problems keeping your blood sugar at target range.  You are having frequent episodes of hypoglycemia.  You feel you might be having side effects from your medicines.  You have symptoms of an illness that is not improving after 3-4 days.  You notice a change in vision or a new problem with your vision. SEEK IMMEDIATE MEDICAL CARE IF:   You are a family member or friend of a   person whose blood glucose goes below 70 mg/dl and is accompanied by:  Confusion.  A change in mental status.  The inability to swallow.  Passing out. Document Released: 10/01/2005 Document Revised: 12/24/2011 Document Reviewed: 01/28/2012 [employer] Patient Information 2014 [employer].  

## 2014-01-27 NOTE — ED Notes (Signed)
Pt is being discharged with family member. VS stable and is being discharged to the waiting room via wheelchair.

## 2014-01-28 ENCOUNTER — Ambulatory Visit (INDEPENDENT_AMBULATORY_CARE_PROVIDER_SITE_OTHER): Payer: Medicare Other | Admitting: Podiatrist

## 2014-01-28 ENCOUNTER — Encounter: Payer: Self-pay | Admitting: Podiatrist

## 2014-01-28 VITALS — BP 130/67 | HR 88 | Resp 18 | Ht 69.0 in | Wt 250.0 lb

## 2014-01-28 DIAGNOSIS — M79609 Pain in unspecified limb: Secondary | ICD-10-CM

## 2014-01-28 DIAGNOSIS — G589 Mononeuropathy, unspecified: Secondary | ICD-10-CM

## 2014-01-28 DIAGNOSIS — E119 Type 2 diabetes mellitus without complications: Secondary | ICD-10-CM

## 2014-01-28 DIAGNOSIS — B351 Tinea unguium: Secondary | ICD-10-CM

## 2014-01-28 DIAGNOSIS — G629 Polyneuropathy, unspecified: Secondary | ICD-10-CM

## 2014-01-29 NOTE — Progress Notes (Signed)
HPI: Patient presents today for follow up of foot and nail care. Denies any new complaints today. Patient continues to be on dialysis with ESRD.    Objective: Patients chart is reviewed. Neurovascular status unchanged with peripheral neuropathy and pedal pulses light but intact bilateral. Patients nails are thickened, discolored, distrophic, friable and brittle with yellow-brown discoloration. Patient subjectively relates they are painful with shoes and with ambulation of bilateral feet.   Assessment: Symptomatic onychomycosis   Plan:  The symptomatic toenails were debrided through manual an mechanical means without complication. Return appointment recommended at routine intervals of 3 months

## 2014-02-11 DIAGNOSIS — N186 End stage renal disease: Secondary | ICD-10-CM | POA: Diagnosis not present

## 2014-02-12 DIAGNOSIS — N2581 Secondary hyperparathyroidism of renal origin: Secondary | ICD-10-CM | POA: Diagnosis not present

## 2014-02-12 DIAGNOSIS — E119 Type 2 diabetes mellitus without complications: Secondary | ICD-10-CM | POA: Diagnosis not present

## 2014-02-12 DIAGNOSIS — N186 End stage renal disease: Secondary | ICD-10-CM | POA: Diagnosis not present

## 2014-02-12 DIAGNOSIS — D631 Anemia in chronic kidney disease: Secondary | ICD-10-CM | POA: Diagnosis not present

## 2014-02-12 DIAGNOSIS — D509 Iron deficiency anemia, unspecified: Secondary | ICD-10-CM | POA: Diagnosis not present

## 2014-03-03 DIAGNOSIS — E1129 Type 2 diabetes mellitus with other diabetic kidney complication: Secondary | ICD-10-CM | POA: Diagnosis not present

## 2014-03-14 DIAGNOSIS — N186 End stage renal disease: Secondary | ICD-10-CM | POA: Diagnosis not present

## 2014-03-15 DIAGNOSIS — D509 Iron deficiency anemia, unspecified: Secondary | ICD-10-CM | POA: Diagnosis not present

## 2014-03-15 DIAGNOSIS — N2581 Secondary hyperparathyroidism of renal origin: Secondary | ICD-10-CM | POA: Diagnosis not present

## 2014-03-15 DIAGNOSIS — N186 End stage renal disease: Secondary | ICD-10-CM | POA: Diagnosis not present

## 2014-03-15 DIAGNOSIS — D631 Anemia in chronic kidney disease: Secondary | ICD-10-CM | POA: Diagnosis not present

## 2014-03-15 DIAGNOSIS — E119 Type 2 diabetes mellitus without complications: Secondary | ICD-10-CM | POA: Diagnosis not present

## 2014-03-18 ENCOUNTER — Other Ambulatory Visit: Payer: Self-pay | Admitting: Podiatrist

## 2014-04-13 DIAGNOSIS — N186 End stage renal disease: Secondary | ICD-10-CM | POA: Diagnosis not present

## 2014-04-14 DIAGNOSIS — D631 Anemia in chronic kidney disease: Secondary | ICD-10-CM | POA: Diagnosis not present

## 2014-04-14 DIAGNOSIS — N186 End stage renal disease: Secondary | ICD-10-CM | POA: Diagnosis not present

## 2014-04-14 DIAGNOSIS — E119 Type 2 diabetes mellitus without complications: Secondary | ICD-10-CM | POA: Diagnosis not present

## 2014-04-14 DIAGNOSIS — D509 Iron deficiency anemia, unspecified: Secondary | ICD-10-CM | POA: Diagnosis not present

## 2014-04-14 DIAGNOSIS — N2581 Secondary hyperparathyroidism of renal origin: Secondary | ICD-10-CM | POA: Diagnosis not present

## 2014-05-05 DIAGNOSIS — E1129 Type 2 diabetes mellitus with other diabetic kidney complication: Secondary | ICD-10-CM | POA: Diagnosis not present

## 2014-05-13 ENCOUNTER — Ambulatory Visit: Payer: BC Managed Care – PPO | Admitting: Podiatrist

## 2014-05-14 DIAGNOSIS — N186 End stage renal disease: Secondary | ICD-10-CM | POA: Diagnosis not present

## 2014-05-17 DIAGNOSIS — E119 Type 2 diabetes mellitus without complications: Secondary | ICD-10-CM | POA: Diagnosis not present

## 2014-05-17 DIAGNOSIS — N039 Chronic nephritic syndrome with unspecified morphologic changes: Secondary | ICD-10-CM | POA: Diagnosis not present

## 2014-05-17 DIAGNOSIS — D631 Anemia in chronic kidney disease: Secondary | ICD-10-CM | POA: Diagnosis not present

## 2014-05-17 DIAGNOSIS — N2581 Secondary hyperparathyroidism of renal origin: Secondary | ICD-10-CM | POA: Diagnosis not present

## 2014-05-17 DIAGNOSIS — D509 Iron deficiency anemia, unspecified: Secondary | ICD-10-CM | POA: Diagnosis not present

## 2014-05-17 DIAGNOSIS — N186 End stage renal disease: Secondary | ICD-10-CM | POA: Diagnosis not present

## 2014-05-19 DIAGNOSIS — N186 End stage renal disease: Secondary | ICD-10-CM | POA: Diagnosis not present

## 2014-05-19 DIAGNOSIS — D509 Iron deficiency anemia, unspecified: Secondary | ICD-10-CM | POA: Diagnosis not present

## 2014-05-19 DIAGNOSIS — D631 Anemia in chronic kidney disease: Secondary | ICD-10-CM | POA: Diagnosis not present

## 2014-05-19 DIAGNOSIS — N2581 Secondary hyperparathyroidism of renal origin: Secondary | ICD-10-CM | POA: Diagnosis not present

## 2014-05-19 DIAGNOSIS — E119 Type 2 diabetes mellitus without complications: Secondary | ICD-10-CM | POA: Diagnosis not present

## 2014-05-21 DIAGNOSIS — N186 End stage renal disease: Secondary | ICD-10-CM | POA: Diagnosis not present

## 2014-05-21 DIAGNOSIS — N2581 Secondary hyperparathyroidism of renal origin: Secondary | ICD-10-CM | POA: Diagnosis not present

## 2014-05-21 DIAGNOSIS — D631 Anemia in chronic kidney disease: Secondary | ICD-10-CM | POA: Diagnosis not present

## 2014-05-21 DIAGNOSIS — D509 Iron deficiency anemia, unspecified: Secondary | ICD-10-CM | POA: Diagnosis not present

## 2014-05-21 DIAGNOSIS — E119 Type 2 diabetes mellitus without complications: Secondary | ICD-10-CM | POA: Diagnosis not present

## 2014-05-24 DIAGNOSIS — E119 Type 2 diabetes mellitus without complications: Secondary | ICD-10-CM | POA: Diagnosis not present

## 2014-05-24 DIAGNOSIS — N2581 Secondary hyperparathyroidism of renal origin: Secondary | ICD-10-CM | POA: Diagnosis not present

## 2014-05-24 DIAGNOSIS — D509 Iron deficiency anemia, unspecified: Secondary | ICD-10-CM | POA: Diagnosis not present

## 2014-05-24 DIAGNOSIS — N186 End stage renal disease: Secondary | ICD-10-CM | POA: Diagnosis not present

## 2014-05-24 DIAGNOSIS — D631 Anemia in chronic kidney disease: Secondary | ICD-10-CM | POA: Diagnosis not present

## 2014-05-26 DIAGNOSIS — D631 Anemia in chronic kidney disease: Secondary | ICD-10-CM | POA: Diagnosis not present

## 2014-05-26 DIAGNOSIS — N2581 Secondary hyperparathyroidism of renal origin: Secondary | ICD-10-CM | POA: Diagnosis not present

## 2014-05-26 DIAGNOSIS — D509 Iron deficiency anemia, unspecified: Secondary | ICD-10-CM | POA: Diagnosis not present

## 2014-05-26 DIAGNOSIS — L299 Pruritus, unspecified: Secondary | ICD-10-CM | POA: Insufficient documentation

## 2014-05-26 DIAGNOSIS — E119 Type 2 diabetes mellitus without complications: Secondary | ICD-10-CM | POA: Diagnosis not present

## 2014-05-26 DIAGNOSIS — N186 End stage renal disease: Secondary | ICD-10-CM | POA: Diagnosis not present

## 2014-05-28 DIAGNOSIS — N039 Chronic nephritic syndrome with unspecified morphologic changes: Secondary | ICD-10-CM | POA: Diagnosis not present

## 2014-05-28 DIAGNOSIS — D509 Iron deficiency anemia, unspecified: Secondary | ICD-10-CM | POA: Diagnosis not present

## 2014-05-28 DIAGNOSIS — D631 Anemia in chronic kidney disease: Secondary | ICD-10-CM | POA: Diagnosis not present

## 2014-05-28 DIAGNOSIS — E119 Type 2 diabetes mellitus without complications: Secondary | ICD-10-CM | POA: Diagnosis not present

## 2014-05-28 DIAGNOSIS — N186 End stage renal disease: Secondary | ICD-10-CM | POA: Diagnosis not present

## 2014-05-28 DIAGNOSIS — N2581 Secondary hyperparathyroidism of renal origin: Secondary | ICD-10-CM | POA: Diagnosis not present

## 2014-05-31 DIAGNOSIS — D631 Anemia in chronic kidney disease: Secondary | ICD-10-CM | POA: Diagnosis not present

## 2014-05-31 DIAGNOSIS — E119 Type 2 diabetes mellitus without complications: Secondary | ICD-10-CM | POA: Diagnosis not present

## 2014-05-31 DIAGNOSIS — D509 Iron deficiency anemia, unspecified: Secondary | ICD-10-CM | POA: Diagnosis not present

## 2014-05-31 DIAGNOSIS — N039 Chronic nephritic syndrome with unspecified morphologic changes: Secondary | ICD-10-CM | POA: Diagnosis not present

## 2014-05-31 DIAGNOSIS — N186 End stage renal disease: Secondary | ICD-10-CM | POA: Diagnosis not present

## 2014-05-31 DIAGNOSIS — N2581 Secondary hyperparathyroidism of renal origin: Secondary | ICD-10-CM | POA: Diagnosis not present

## 2014-06-02 DIAGNOSIS — E119 Type 2 diabetes mellitus without complications: Secondary | ICD-10-CM | POA: Diagnosis not present

## 2014-06-02 DIAGNOSIS — N186 End stage renal disease: Secondary | ICD-10-CM | POA: Diagnosis not present

## 2014-06-02 DIAGNOSIS — D631 Anemia in chronic kidney disease: Secondary | ICD-10-CM | POA: Diagnosis not present

## 2014-06-02 DIAGNOSIS — N2581 Secondary hyperparathyroidism of renal origin: Secondary | ICD-10-CM | POA: Diagnosis not present

## 2014-06-02 DIAGNOSIS — D509 Iron deficiency anemia, unspecified: Secondary | ICD-10-CM | POA: Diagnosis not present

## 2014-06-04 DIAGNOSIS — N2581 Secondary hyperparathyroidism of renal origin: Secondary | ICD-10-CM | POA: Diagnosis not present

## 2014-06-04 DIAGNOSIS — E119 Type 2 diabetes mellitus without complications: Secondary | ICD-10-CM | POA: Diagnosis not present

## 2014-06-04 DIAGNOSIS — N186 End stage renal disease: Secondary | ICD-10-CM | POA: Diagnosis not present

## 2014-06-04 DIAGNOSIS — D631 Anemia in chronic kidney disease: Secondary | ICD-10-CM | POA: Diagnosis not present

## 2014-06-04 DIAGNOSIS — D509 Iron deficiency anemia, unspecified: Secondary | ICD-10-CM | POA: Diagnosis not present

## 2014-06-04 DIAGNOSIS — N039 Chronic nephritic syndrome with unspecified morphologic changes: Secondary | ICD-10-CM | POA: Diagnosis not present

## 2014-06-07 DIAGNOSIS — N186 End stage renal disease: Secondary | ICD-10-CM | POA: Diagnosis not present

## 2014-06-07 DIAGNOSIS — E119 Type 2 diabetes mellitus without complications: Secondary | ICD-10-CM | POA: Diagnosis not present

## 2014-06-07 DIAGNOSIS — D509 Iron deficiency anemia, unspecified: Secondary | ICD-10-CM | POA: Diagnosis not present

## 2014-06-07 DIAGNOSIS — N2581 Secondary hyperparathyroidism of renal origin: Secondary | ICD-10-CM | POA: Diagnosis not present

## 2014-06-07 DIAGNOSIS — D631 Anemia in chronic kidney disease: Secondary | ICD-10-CM | POA: Diagnosis not present

## 2014-06-09 DIAGNOSIS — D631 Anemia in chronic kidney disease: Secondary | ICD-10-CM | POA: Diagnosis not present

## 2014-06-09 DIAGNOSIS — N186 End stage renal disease: Secondary | ICD-10-CM | POA: Diagnosis not present

## 2014-06-09 DIAGNOSIS — N2581 Secondary hyperparathyroidism of renal origin: Secondary | ICD-10-CM | POA: Diagnosis not present

## 2014-06-09 DIAGNOSIS — D509 Iron deficiency anemia, unspecified: Secondary | ICD-10-CM | POA: Diagnosis not present

## 2014-06-09 DIAGNOSIS — N039 Chronic nephritic syndrome with unspecified morphologic changes: Secondary | ICD-10-CM | POA: Diagnosis not present

## 2014-06-09 DIAGNOSIS — E119 Type 2 diabetes mellitus without complications: Secondary | ICD-10-CM | POA: Diagnosis not present

## 2014-06-11 DIAGNOSIS — N186 End stage renal disease: Secondary | ICD-10-CM | POA: Diagnosis not present

## 2014-06-11 DIAGNOSIS — N2581 Secondary hyperparathyroidism of renal origin: Secondary | ICD-10-CM | POA: Diagnosis not present

## 2014-06-11 DIAGNOSIS — E119 Type 2 diabetes mellitus without complications: Secondary | ICD-10-CM | POA: Diagnosis not present

## 2014-06-11 DIAGNOSIS — D509 Iron deficiency anemia, unspecified: Secondary | ICD-10-CM | POA: Diagnosis not present

## 2014-06-11 DIAGNOSIS — D631 Anemia in chronic kidney disease: Secondary | ICD-10-CM | POA: Diagnosis not present

## 2014-06-14 DIAGNOSIS — D631 Anemia in chronic kidney disease: Secondary | ICD-10-CM | POA: Diagnosis not present

## 2014-06-14 DIAGNOSIS — N2581 Secondary hyperparathyroidism of renal origin: Secondary | ICD-10-CM | POA: Diagnosis not present

## 2014-06-14 DIAGNOSIS — D509 Iron deficiency anemia, unspecified: Secondary | ICD-10-CM | POA: Diagnosis not present

## 2014-06-14 DIAGNOSIS — E119 Type 2 diabetes mellitus without complications: Secondary | ICD-10-CM | POA: Diagnosis not present

## 2014-06-14 DIAGNOSIS — N186 End stage renal disease: Secondary | ICD-10-CM | POA: Diagnosis not present

## 2014-06-16 DIAGNOSIS — D631 Anemia in chronic kidney disease: Secondary | ICD-10-CM | POA: Diagnosis not present

## 2014-06-16 DIAGNOSIS — N186 End stage renal disease: Secondary | ICD-10-CM | POA: Diagnosis not present

## 2014-06-16 DIAGNOSIS — D509 Iron deficiency anemia, unspecified: Secondary | ICD-10-CM | POA: Diagnosis not present

## 2014-06-16 DIAGNOSIS — E119 Type 2 diabetes mellitus without complications: Secondary | ICD-10-CM | POA: Diagnosis not present

## 2014-06-16 DIAGNOSIS — N2581 Secondary hyperparathyroidism of renal origin: Secondary | ICD-10-CM | POA: Diagnosis not present

## 2014-07-01 DIAGNOSIS — E1151 Type 2 diabetes mellitus with diabetic peripheral angiopathy without gangrene: Secondary | ICD-10-CM | POA: Insufficient documentation

## 2014-07-14 DIAGNOSIS — N186 End stage renal disease: Secondary | ICD-10-CM | POA: Diagnosis not present

## 2014-07-16 DIAGNOSIS — E119 Type 2 diabetes mellitus without complications: Secondary | ICD-10-CM | POA: Diagnosis not present

## 2014-07-16 DIAGNOSIS — Z23 Encounter for immunization: Secondary | ICD-10-CM | POA: Diagnosis not present

## 2014-07-16 DIAGNOSIS — D509 Iron deficiency anemia, unspecified: Secondary | ICD-10-CM | POA: Diagnosis not present

## 2014-07-16 DIAGNOSIS — D631 Anemia in chronic kidney disease: Secondary | ICD-10-CM | POA: Diagnosis not present

## 2014-07-16 DIAGNOSIS — D689 Coagulation defect, unspecified: Secondary | ICD-10-CM | POA: Insufficient documentation

## 2014-07-16 DIAGNOSIS — N2581 Secondary hyperparathyroidism of renal origin: Secondary | ICD-10-CM | POA: Diagnosis not present

## 2014-07-16 DIAGNOSIS — N186 End stage renal disease: Secondary | ICD-10-CM | POA: Diagnosis not present

## 2014-08-11 DIAGNOSIS — E1129 Type 2 diabetes mellitus with other diabetic kidney complication: Secondary | ICD-10-CM | POA: Diagnosis not present

## 2014-08-14 DIAGNOSIS — N186 End stage renal disease: Secondary | ICD-10-CM | POA: Diagnosis not present

## 2014-08-14 DIAGNOSIS — Z992 Dependence on renal dialysis: Secondary | ICD-10-CM | POA: Diagnosis not present

## 2014-08-16 DIAGNOSIS — N2581 Secondary hyperparathyroidism of renal origin: Secondary | ICD-10-CM | POA: Diagnosis not present

## 2014-08-16 DIAGNOSIS — E119 Type 2 diabetes mellitus without complications: Secondary | ICD-10-CM | POA: Diagnosis not present

## 2014-08-16 DIAGNOSIS — D509 Iron deficiency anemia, unspecified: Secondary | ICD-10-CM | POA: Diagnosis not present

## 2014-08-16 DIAGNOSIS — D631 Anemia in chronic kidney disease: Secondary | ICD-10-CM | POA: Diagnosis not present

## 2014-08-16 DIAGNOSIS — N186 End stage renal disease: Secondary | ICD-10-CM | POA: Diagnosis not present

## 2014-08-18 DIAGNOSIS — D509 Iron deficiency anemia, unspecified: Secondary | ICD-10-CM | POA: Diagnosis not present

## 2014-08-18 DIAGNOSIS — D631 Anemia in chronic kidney disease: Secondary | ICD-10-CM | POA: Diagnosis not present

## 2014-08-18 DIAGNOSIS — N186 End stage renal disease: Secondary | ICD-10-CM | POA: Diagnosis not present

## 2014-08-18 DIAGNOSIS — N2581 Secondary hyperparathyroidism of renal origin: Secondary | ICD-10-CM | POA: Diagnosis not present

## 2014-08-18 DIAGNOSIS — E119 Type 2 diabetes mellitus without complications: Secondary | ICD-10-CM | POA: Diagnosis not present

## 2014-08-20 DIAGNOSIS — N186 End stage renal disease: Secondary | ICD-10-CM | POA: Diagnosis not present

## 2014-08-20 DIAGNOSIS — N2581 Secondary hyperparathyroidism of renal origin: Secondary | ICD-10-CM | POA: Diagnosis not present

## 2014-08-20 DIAGNOSIS — D631 Anemia in chronic kidney disease: Secondary | ICD-10-CM | POA: Diagnosis not present

## 2014-08-20 DIAGNOSIS — D509 Iron deficiency anemia, unspecified: Secondary | ICD-10-CM | POA: Diagnosis not present

## 2014-08-20 DIAGNOSIS — E119 Type 2 diabetes mellitus without complications: Secondary | ICD-10-CM | POA: Diagnosis not present

## 2014-08-23 DIAGNOSIS — D509 Iron deficiency anemia, unspecified: Secondary | ICD-10-CM | POA: Diagnosis not present

## 2014-08-23 DIAGNOSIS — N2581 Secondary hyperparathyroidism of renal origin: Secondary | ICD-10-CM | POA: Diagnosis not present

## 2014-08-23 DIAGNOSIS — N186 End stage renal disease: Secondary | ICD-10-CM | POA: Diagnosis not present

## 2014-08-23 DIAGNOSIS — D631 Anemia in chronic kidney disease: Secondary | ICD-10-CM | POA: Diagnosis not present

## 2014-08-23 DIAGNOSIS — E119 Type 2 diabetes mellitus without complications: Secondary | ICD-10-CM | POA: Diagnosis not present

## 2014-08-24 DIAGNOSIS — Z992 Dependence on renal dialysis: Secondary | ICD-10-CM | POA: Diagnosis not present

## 2014-08-24 DIAGNOSIS — I871 Compression of vein: Secondary | ICD-10-CM | POA: Diagnosis not present

## 2014-08-24 DIAGNOSIS — T82858A Stenosis of vascular prosthetic devices, implants and grafts, initial encounter: Secondary | ICD-10-CM | POA: Diagnosis not present

## 2014-08-24 DIAGNOSIS — N186 End stage renal disease: Secondary | ICD-10-CM | POA: Diagnosis not present

## 2014-08-25 DIAGNOSIS — D509 Iron deficiency anemia, unspecified: Secondary | ICD-10-CM | POA: Diagnosis not present

## 2014-08-25 DIAGNOSIS — D631 Anemia in chronic kidney disease: Secondary | ICD-10-CM | POA: Diagnosis not present

## 2014-08-25 DIAGNOSIS — N2581 Secondary hyperparathyroidism of renal origin: Secondary | ICD-10-CM | POA: Diagnosis not present

## 2014-08-25 DIAGNOSIS — N186 End stage renal disease: Secondary | ICD-10-CM | POA: Diagnosis not present

## 2014-08-25 DIAGNOSIS — E119 Type 2 diabetes mellitus without complications: Secondary | ICD-10-CM | POA: Diagnosis not present

## 2014-08-27 DIAGNOSIS — N186 End stage renal disease: Secondary | ICD-10-CM | POA: Diagnosis not present

## 2014-08-27 DIAGNOSIS — D509 Iron deficiency anemia, unspecified: Secondary | ICD-10-CM | POA: Diagnosis not present

## 2014-08-27 DIAGNOSIS — N2581 Secondary hyperparathyroidism of renal origin: Secondary | ICD-10-CM | POA: Diagnosis not present

## 2014-08-27 DIAGNOSIS — E119 Type 2 diabetes mellitus without complications: Secondary | ICD-10-CM | POA: Diagnosis not present

## 2014-08-27 DIAGNOSIS — D631 Anemia in chronic kidney disease: Secondary | ICD-10-CM | POA: Diagnosis not present

## 2014-08-30 DIAGNOSIS — D509 Iron deficiency anemia, unspecified: Secondary | ICD-10-CM | POA: Diagnosis not present

## 2014-08-30 DIAGNOSIS — N2581 Secondary hyperparathyroidism of renal origin: Secondary | ICD-10-CM | POA: Diagnosis not present

## 2014-08-30 DIAGNOSIS — E119 Type 2 diabetes mellitus without complications: Secondary | ICD-10-CM | POA: Diagnosis not present

## 2014-08-30 DIAGNOSIS — N186 End stage renal disease: Secondary | ICD-10-CM | POA: Diagnosis not present

## 2014-08-30 DIAGNOSIS — D631 Anemia in chronic kidney disease: Secondary | ICD-10-CM | POA: Diagnosis not present

## 2014-09-01 DIAGNOSIS — N186 End stage renal disease: Secondary | ICD-10-CM | POA: Diagnosis not present

## 2014-09-01 DIAGNOSIS — D631 Anemia in chronic kidney disease: Secondary | ICD-10-CM | POA: Diagnosis not present

## 2014-09-01 DIAGNOSIS — D509 Iron deficiency anemia, unspecified: Secondary | ICD-10-CM | POA: Diagnosis not present

## 2014-09-01 DIAGNOSIS — N2581 Secondary hyperparathyroidism of renal origin: Secondary | ICD-10-CM | POA: Diagnosis not present

## 2014-09-01 DIAGNOSIS — E119 Type 2 diabetes mellitus without complications: Secondary | ICD-10-CM | POA: Diagnosis not present

## 2014-09-03 DIAGNOSIS — N2581 Secondary hyperparathyroidism of renal origin: Secondary | ICD-10-CM | POA: Diagnosis not present

## 2014-09-03 DIAGNOSIS — N186 End stage renal disease: Secondary | ICD-10-CM | POA: Diagnosis not present

## 2014-09-03 DIAGNOSIS — E119 Type 2 diabetes mellitus without complications: Secondary | ICD-10-CM | POA: Diagnosis not present

## 2014-09-03 DIAGNOSIS — D509 Iron deficiency anemia, unspecified: Secondary | ICD-10-CM | POA: Diagnosis not present

## 2014-09-03 DIAGNOSIS — D631 Anemia in chronic kidney disease: Secondary | ICD-10-CM | POA: Diagnosis not present

## 2014-09-05 DIAGNOSIS — D631 Anemia in chronic kidney disease: Secondary | ICD-10-CM | POA: Diagnosis not present

## 2014-09-05 DIAGNOSIS — N2581 Secondary hyperparathyroidism of renal origin: Secondary | ICD-10-CM | POA: Diagnosis not present

## 2014-09-05 DIAGNOSIS — D509 Iron deficiency anemia, unspecified: Secondary | ICD-10-CM | POA: Diagnosis not present

## 2014-09-05 DIAGNOSIS — E119 Type 2 diabetes mellitus without complications: Secondary | ICD-10-CM | POA: Diagnosis not present

## 2014-09-05 DIAGNOSIS — N186 End stage renal disease: Secondary | ICD-10-CM | POA: Diagnosis not present

## 2014-09-07 DIAGNOSIS — D509 Iron deficiency anemia, unspecified: Secondary | ICD-10-CM | POA: Diagnosis not present

## 2014-09-07 DIAGNOSIS — N2581 Secondary hyperparathyroidism of renal origin: Secondary | ICD-10-CM | POA: Diagnosis not present

## 2014-09-07 DIAGNOSIS — E119 Type 2 diabetes mellitus without complications: Secondary | ICD-10-CM | POA: Diagnosis not present

## 2014-09-07 DIAGNOSIS — N186 End stage renal disease: Secondary | ICD-10-CM | POA: Diagnosis not present

## 2014-09-07 DIAGNOSIS — D631 Anemia in chronic kidney disease: Secondary | ICD-10-CM | POA: Diagnosis not present

## 2014-09-10 DIAGNOSIS — D509 Iron deficiency anemia, unspecified: Secondary | ICD-10-CM | POA: Diagnosis not present

## 2014-09-10 DIAGNOSIS — E119 Type 2 diabetes mellitus without complications: Secondary | ICD-10-CM | POA: Diagnosis not present

## 2014-09-10 DIAGNOSIS — D631 Anemia in chronic kidney disease: Secondary | ICD-10-CM | POA: Diagnosis not present

## 2014-09-10 DIAGNOSIS — N2581 Secondary hyperparathyroidism of renal origin: Secondary | ICD-10-CM | POA: Diagnosis not present

## 2014-09-10 DIAGNOSIS — N186 End stage renal disease: Secondary | ICD-10-CM | POA: Diagnosis not present

## 2014-09-13 DIAGNOSIS — D631 Anemia in chronic kidney disease: Secondary | ICD-10-CM | POA: Diagnosis not present

## 2014-09-13 DIAGNOSIS — N182 Chronic kidney disease, stage 2 (mild): Secondary | ICD-10-CM | POA: Diagnosis not present

## 2014-09-13 DIAGNOSIS — N186 End stage renal disease: Secondary | ICD-10-CM | POA: Diagnosis not present

## 2014-09-13 DIAGNOSIS — E119 Type 2 diabetes mellitus without complications: Secondary | ICD-10-CM | POA: Diagnosis not present

## 2014-09-13 DIAGNOSIS — D509 Iron deficiency anemia, unspecified: Secondary | ICD-10-CM | POA: Diagnosis not present

## 2014-09-13 DIAGNOSIS — Z992 Dependence on renal dialysis: Secondary | ICD-10-CM | POA: Diagnosis not present

## 2014-09-13 DIAGNOSIS — N2581 Secondary hyperparathyroidism of renal origin: Secondary | ICD-10-CM | POA: Diagnosis not present

## 2014-09-15 DIAGNOSIS — D631 Anemia in chronic kidney disease: Secondary | ICD-10-CM | POA: Diagnosis not present

## 2014-09-15 DIAGNOSIS — E119 Type 2 diabetes mellitus without complications: Secondary | ICD-10-CM | POA: Diagnosis not present

## 2014-09-15 DIAGNOSIS — D509 Iron deficiency anemia, unspecified: Secondary | ICD-10-CM | POA: Diagnosis not present

## 2014-09-15 DIAGNOSIS — N2581 Secondary hyperparathyroidism of renal origin: Secondary | ICD-10-CM | POA: Diagnosis not present

## 2014-09-15 DIAGNOSIS — N186 End stage renal disease: Secondary | ICD-10-CM | POA: Diagnosis not present

## 2014-09-21 ENCOUNTER — Emergency Department (HOSPITAL_COMMUNITY): Payer: Medicare Other

## 2014-09-21 ENCOUNTER — Encounter (HOSPITAL_COMMUNITY): Payer: Self-pay | Admitting: Emergency Medicine

## 2014-09-21 ENCOUNTER — Emergency Department (HOSPITAL_COMMUNITY)
Admission: EM | Admit: 2014-09-21 | Discharge: 2014-09-21 | Disposition: A | Discharge status: Home / Self Care | Payer: Medicare Other | Attending: Emergency Medicine | Admitting: Emergency Medicine

## 2014-09-21 DIAGNOSIS — H9191 Unspecified hearing loss, right ear: Secondary | ICD-10-CM | POA: Diagnosis not present

## 2014-09-21 DIAGNOSIS — Z87891 Personal history of nicotine dependence: Secondary | ICD-10-CM | POA: Insufficient documentation

## 2014-09-21 DIAGNOSIS — I5022 Chronic systolic (congestive) heart failure: Secondary | ICD-10-CM | POA: Insufficient documentation

## 2014-09-21 DIAGNOSIS — Z8619 Personal history of other infectious and parasitic diseases: Secondary | ICD-10-CM | POA: Diagnosis not present

## 2014-09-21 DIAGNOSIS — Z7982 Long term (current) use of aspirin: Secondary | ICD-10-CM | POA: Diagnosis not present

## 2014-09-21 DIAGNOSIS — Z9889 Other specified postprocedural states: Secondary | ICD-10-CM | POA: Insufficient documentation

## 2014-09-21 DIAGNOSIS — Z79899 Other long term (current) drug therapy: Secondary | ICD-10-CM | POA: Insufficient documentation

## 2014-09-21 DIAGNOSIS — E119 Type 2 diabetes mellitus without complications: Secondary | ICD-10-CM | POA: Insufficient documentation

## 2014-09-21 DIAGNOSIS — J449 Chronic obstructive pulmonary disease, unspecified: Secondary | ICD-10-CM | POA: Insufficient documentation

## 2014-09-21 DIAGNOSIS — I2510 Atherosclerotic heart disease of native coronary artery without angina pectoris: Secondary | ICD-10-CM | POA: Diagnosis not present

## 2014-09-21 DIAGNOSIS — E785 Hyperlipidemia, unspecified: Secondary | ICD-10-CM | POA: Insufficient documentation

## 2014-09-21 DIAGNOSIS — Z992 Dependence on renal dialysis: Secondary | ICD-10-CM | POA: Diagnosis not present

## 2014-09-21 DIAGNOSIS — N186 End stage renal disease: Secondary | ICD-10-CM | POA: Insufficient documentation

## 2014-09-21 DIAGNOSIS — R51 Headache: Secondary | ICD-10-CM | POA: Diagnosis not present

## 2014-09-21 DIAGNOSIS — E669 Obesity, unspecified: Secondary | ICD-10-CM | POA: Diagnosis not present

## 2014-09-21 DIAGNOSIS — I69351 Hemiplegia and hemiparesis following cerebral infarction affecting right dominant side: Secondary | ICD-10-CM | POA: Insufficient documentation

## 2014-09-21 DIAGNOSIS — M109 Gout, unspecified: Secondary | ICD-10-CM | POA: Insufficient documentation

## 2014-09-21 DIAGNOSIS — I120 Hypertensive chronic kidney disease with stage 5 chronic kidney disease or end stage renal disease: Secondary | ICD-10-CM | POA: Diagnosis not present

## 2014-09-21 DIAGNOSIS — I69398 Other sequelae of cerebral infarction: Secondary | ICD-10-CM | POA: Diagnosis not present

## 2014-09-21 DIAGNOSIS — I69998 Other sequelae following unspecified cerebrovascular disease: Secondary | ICD-10-CM | POA: Diagnosis not present

## 2014-09-21 DIAGNOSIS — R209 Unspecified disturbances of skin sensation: Secondary | ICD-10-CM

## 2014-09-21 DIAGNOSIS — Z7902 Long term (current) use of antithrombotics/antiplatelets: Secondary | ICD-10-CM | POA: Diagnosis not present

## 2014-09-21 DIAGNOSIS — Z794 Long term (current) use of insulin: Secondary | ICD-10-CM | POA: Insufficient documentation

## 2014-09-21 HISTORY — DX: Cerebral infarction, unspecified: I63.9

## 2014-09-21 LAB — CBC WITH DIFFERENTIAL/PLATELET
Basophils Absolute: 0.1 10*3/uL (ref 0.0–0.1)
Basophils Relative: 1 % (ref 0–1)
Eosinophils Absolute: 0.2 10*3/uL (ref 0.0–0.7)
Eosinophils Relative: 2 % (ref 0–5)
HCT: 40.3 % (ref 39.0–52.0)
Hemoglobin: 13.2 g/dL (ref 13.0–17.0)
Lymphocytes Relative: 32 % (ref 12–46)
Lymphs Abs: 2.6 10*3/uL (ref 0.7–4.0)
MCH: 29.7 pg (ref 26.0–34.0)
MCHC: 32.8 g/dL (ref 30.0–36.0)
MCV: 90.8 fL (ref 78.0–100.0)
Monocytes Absolute: 0.6 10*3/uL (ref 0.1–1.0)
Monocytes Relative: 7 % (ref 3–12)
Neutro Abs: 4.7 10*3/uL (ref 1.7–7.7)
Neutrophils Relative %: 58 % (ref 43–77)
Platelets: 210 10*3/uL (ref 150–400)
RBC: 4.44 MIL/uL (ref 4.22–5.81)
RDW: 14.1 % (ref 11.5–15.5)
WBC: 8.1 10*3/uL (ref 4.0–10.5)

## 2014-09-21 LAB — BASIC METABOLIC PANEL
Anion gap: 17 — ABNORMAL HIGH (ref 5–15)
BUN: 50 mg/dL — ABNORMAL HIGH (ref 6–23)
CO2: 28 mEq/L (ref 19–32)
Calcium: 9.9 mg/dL (ref 8.4–10.5)
Chloride: 94 mEq/L — ABNORMAL LOW (ref 96–112)
Creatinine, Ser: 9.8 mg/dL — ABNORMAL HIGH (ref 0.50–1.35)
GFR calc Af Amer: 6 mL/min — ABNORMAL LOW (ref >90)
GFR calc non Af Amer: 5 mL/min — ABNORMAL LOW (ref >90)
Glucose, Bld: 207 mg/dL — ABNORMAL HIGH (ref 70–99)
Potassium: 5.3 mEq/L (ref 3.7–5.3)
Sodium: 139 mEq/L (ref 137–147)

## 2014-09-21 NOTE — Discharge Instructions (Signed)
Return here as needed.  Follow-up with the neurologist provided in the resources for primary care doctors

## 2014-09-21 NOTE — ED Provider Notes (Signed)
CSN: [id number]     Arrival date & time 09/21/14  1228 History   First MD Initiated Contact with Patient 09/21/14 1255     Chief Complaint  Patient presents with  . Headache     (Consider location/radiation/quality/duration/timing/severity/associated sxs/prior Treatment)  HPI: Ernest [name] is a 68 year old African American male with PMH of ESRD (Dialysis on Monday, Wednesday, and Friday), HTN, HLD, and CHF (EF: 30-35%) who presents to the ED on 09/21/2014 for worsening "tingling" along his right ear going toward the frontal aspect of his scalp that has been present since his "stroke" 2 months ago. Patient reports he started to have sudden onset of trouble speaking and right sided weakness approximately 2 months ago and his wife and daughter thought he might be having a stroke. He did not seek any medical attention at that time and has not been seen by a medical provider since those symptoms started. He says the weakness has improved but is still present and denies having any trouble walking. He says his speech problem has been consistent as well and has not increased in severity. He does report the "tingling" sensation along his right ear has been consistently getting worse and is present for a majority of the day, most days of the week. He endorses some hearing loss in his right ear that has been present for the past two months as well.   Past Medical History  Diagnosis Date  . Hyperlipidemia   . Retinopathy   . Gout   . ESRD (end stage renal disease)     Started HD in [place] in 2009, ESRD was due to DM. Moved to [facility] in Dec 2009 and now gets dialysis at [facility] on a MWF schedule.     . Type II or unspecified type diabetes mellitus without mention of complication, not stated as uncontrolled     adult onset  . Erectile dysfunction     penile implant  . Abnormal stress test     s/p cath November 2013 with modest disease involving the ostial left main, proximal LAD, proximal RCA - do not  appear to be hemodynamically signficant; mild LV dysfunction  . Obesity   . Hypertension   . Hepatitis C   . COPD (chronic obstructive pulmonary disease)   . Coronary artery disease     per cath report 2013  . Ejection fraction < 50%   . Chronic systolic CHF (congestive heart failure)   . Bacteremia   . Shortness of breath     Hx: of with exertion  . Gout     Hx: of  . Stroke    Past Surgical History  Procedure Laterality Date  . Penile prosthesis implant    . Fistula      RUE and wrist  . Cardiac catheterization  August 26, 2012  . Eye surgery      laser.  and surgery for DM  . Insertion of dialysis catheter Right 01/01/2013    Procedure: INSERTION OF DIALYSIS CATHETER right  internal jugular;  Surgeon: Serafina Mitchell, MD;  Location: [place];  Service: Vascular;  Laterality: Right;  . Av fistula placement Left 01/13/2013    Procedure: INSERTION OF ARTERIOVENOUS (AV) GORE-TEX GRAFT ARM;  Surgeon: Angelia Mould, MD;  Location: [place];  Service: Vascular;  Laterality: Left;  . Thrombectomy w/ embolectomy Left 03/26/2013    Procedure: Attempted thrombectomy of left arm arteriovenous goretex graft.;  Surgeon: Angelia Mould, MD;  [facility] OR;  Service: Vascular;  Laterality: Left;  . Insertion of dialysis catheter N/A 03/26/2013    Procedure: INSERTION OF DIALYSIS CATHETER Left internal jugular vein;  Surgeon: Angelia Mould, MD;  Location: [place];  Service: Vascular;  Laterality: N/A;  . Avgg removal Left 03/26/2013    Procedure: REMOVAL OF  NON INCORPORATED ARTERIOVENOUS GORETEX GRAFT ([place]) left arm * repair of  left brachial artery with vein patch angioplasty.;  Surgeon: Angelia Mould, MD;  Location: [place];  Service: Vascular;  Laterality: Left;  . Av fistula placement Left 05/05/2013    Procedure: INSERTION OF LEFT UPPER ARM  ARTERIOVENOUS GORTEX GRAFT;  Surgeon: Angelia Mould, MD;  Location: [place];  Service: Vascular;  Laterality: Left;  . Tee  without cardioversion N/A 06/11/2013    Procedure: TRANSESOPHAGEAL ECHOCARDIOGRAM (TEE);  Surgeon: Larey Dresser, MD;  Location: [place];  Service: Cardiovascular;  Laterality: N/A;  . Embolectomy Right 08/27/2013    Procedure: EMBOLECTOMY;  Surgeon: Mal Misty, MD;  Location: [place];  Service: Vascular;  Laterality: Right;  Thrombectomy of Radial and ulnar artery.  . Cataract extraction w/ intraocular lens  implant, bilateral    . Colonoscopy    . Ligation of arteriovenous  fistula Right 12/30/2013    Procedure: REMOVAL OF SEGMENT OF GORTEX GRAFT AND FISTULA  AND REPAIR OF BRACHIAL ARTERY;  Surgeon: Mal Misty, MD;  Location: [facility] OR;  Service: Vascular;  Laterality: Right;   Family History  Problem Relation Age of Onset  . Heart disease Father    History  Substance Use Topics  . Smoking status: Former Smoker -- 7 years    Quit date: 06/10/1973  . Smokeless tobacco: Never Used  . Alcohol Use: No    Review of Systems  Constitutional: Positive for chills. Negative for fever, diaphoresis, activity change, appetite change and fatigue.  HENT: Positive for hearing loss and voice change. Negative for congestion, ear discharge, ear pain, facial swelling, mouth sores, nosebleeds, rhinorrhea, sinus pressure, sore throat and trouble swallowing.   Eyes: Negative for photophobia, discharge and visual disturbance.  Respiratory: Negative for cough, choking, chest tightness, shortness of breath, wheezing and stridor.   Cardiovascular: Negative for chest pain, palpitations and leg swelling.  Gastrointestinal: Negative for nausea, vomiting, abdominal pain, diarrhea, constipation, blood in stool and abdominal distention.  Endocrine: Negative for polydipsia, polyphagia and polyuria.  Genitourinary: Positive for decreased urine volume. Negative for dysuria, urgency, frequency, hematuria, flank pain and difficulty urinating.  Musculoskeletal: Negative for myalgias, arthralgias, gait problem, neck  pain and neck stiffness.  Skin: Negative for pallor, rash and wound.  Neurological: Positive for speech difficulty and weakness. Negative for dizziness, tremors, syncope, facial asymmetry, light-headedness, numbness and headaches.  Hematological: Negative for adenopathy. Does not bruise/bleed easily.      Allergies  Review of patient's allergies indicates no known allergies.  Home Medications   Prior to Admission medications   Medication Sig Start Date End Date Taking? Authorizing Provider  albuterol (PROVENTIL HFA;VENTOLIN HFA) 108 (90 BASE) MCG/ACT inhaler Inhale 2 puffs into the lungs every 12 (twelve) hours as needed for wheezing.    Yes Historical Provider, MD  allopurinol (ZYLOPRIM) 100 MG tablet Take 100 mg by mouth daily.    Yes Historical Provider, MD  amitriptyline (ELAVIL) 25 MG tablet TAKE ONE TABLET BY MOUTH AT BEDTIME FOR  NEUROPATHY 03/18/14  Yes Bronson Ing, DPM  aspirin EC 81 MG tablet Take 81 mg by mouth daily. 06/18/13  Yes Carlena Bjornstad, MD  Calcium Acetate 667 MG TABS Take 1,334 tablets by mouth 3 (three) times daily with meals.    Yes Historical Provider, MD  cinacalcet (SENSIPAR) 60 MG tablet Take 60 mg by mouth 2 (two) times daily.    Yes Historical Provider, MD  clopidogrel (PLAVIX) 75 MG tablet Take 75 mg by mouth daily.    Yes Historical Provider, MD  colchicine 0.6 MG tablet Take 0.6 mg by mouth daily as needed (for gout flare-ups).    Yes Historical Provider, MD  insulin NPH (HUMULIN N,NOVOLIN N) 100 UNIT/ML injection Inject 10 Units into the skin 2 (two) times daily.    Yes Historical Provider, MD  insulin regular (NOVOLIN R,HUMULIN R) 100 units/mL injection Inject 30 Units into the skin 3 (three) times daily before meals.    Yes Historical Provider, MD  metoprolol (LOPRESSOR) 50 MG tablet Take 50 mg by mouth 2 (two) times daily.    Yes Historical Provider, MD  multivitamin (RENA-VIT) TABS tablet Take 1 tablet by mouth daily.    Yes Historical Provider, MD   sevelamer (RENVELA) 800 MG tablet Take 800-3,200 mg by mouth 5 (five) times daily. 4 tablets three times daily with meals and 1 tablet twice daily with snacks.   Yes Historical Provider, MD  simvastatin (ZOCOR) 20 MG tablet Take 20 mg by mouth every morning.    Yes Historical Provider, MD   BP 149/102 mmHg  Pulse 76  Temp(Src) 98.1 F (36.7 C) (Oral)  Resp 18  Ht 5\' 9"  (1.753 m)  SpO2 99% Physical Exam  Constitutional: He is oriented to person, place, and time. He appears well-developed and well-nourished. No distress.  HENT:  Head: Normocephalic and atraumatic.  Right Ear: External ear normal.  Left Ear: External ear normal.  Nose: Nose normal.  Mouth/Throat: Oropharynx is clear and moist. No oropharyngeal exudate.  Decreased hearing on right side when tested with whisper test.  Eyes: Conjunctivae and EOM are normal. Pupils are equal, round, and reactive to light. Right eye exhibits no discharge. Left eye exhibits no discharge.  Neck: Normal range of motion. Neck supple. No thyromegaly present.  Cardiovascular: Normal rate, regular rhythm, normal heart sounds and intact distal pulses.  Exam reveals no gallop and no friction rub.   No murmur heard. Pulmonary/Chest: Effort normal and breath sounds normal. No stridor. No respiratory distress. He has no wheezes. He has no rales. He exhibits no tenderness.  Abdominal: Soft. Bowel sounds are normal. He exhibits no distension and no mass. There is no hepatosplenomegaly. There is no tenderness. There is no rebound, no guarding and no CVA tenderness.  Musculoskeletal: Normal range of motion. He exhibits no edema or tenderness.  Lymphadenopathy:    He has no cervical adenopathy.  Neurological: He is alert and oriented to person, place, and time. He has normal reflexes. No cranial nerve deficit or sensory deficit. Coordination and gait normal.  4/5 strength in right upper and right lower extremities. 5/5 strength in left upper and left lower  extremities.  Skin: Skin is warm and dry. No rash noted. He is not diaphoretic. No erythema. No pallor.  Nursing note and vitals reviewed.   ED Course  Procedures (including critical care time) Labs Review Labs Reviewed  BASIC METABOLIC PANEL  CBC WITH DIFFERENTIAL    Imaging Review Ct Head Wo Contrast  09/21/2014   CLINICAL DATA:  Headache.  Prior strokes.  EXAM: CT HEAD WITHOUT CONTRAST  TECHNIQUE: Contiguous axial images were obtained  from the base of the skull through the vertex without intravenous contrast.  COMPARISON:  None.  FINDINGS: No mass lesion. No midline shift. No acute hemorrhage or hematoma. No extra-axial fluid collections. No evidence of acute infarction. There is an old right cerebellar hemisphere infarct. There are benign calcifications in the pons and basal ganglia. There is a tiny old lacunar infarct in the left side of the pons. There is slight diffuse cerebral cortical atrophy. No ventricular dilatation. Small old lacunar infarct just lateral to the head of the right caudate nucleus.  There is almost complete opacification of the right mastoid air cells with partial opacification of the left mastoid air cells. Middle ear cavities are clear.  IMPRESSION: No acute intracranial abnormality. Slight atrophy. Old right cerebellar, left pontine, and right basal ganglia infarcts.   Electronically Signed   By: Rozetta Nunnery M.D.   On: 09/21/2014 15:28    I spoke with the neuro hospitalist, who advised that at this point, just have him follow-up with his primary doctor.  Stroke was 2 months ago and he still has some residual from this.  No new symptoms.  The patient states his main issue is this feeling of tingling in the right side of his face and head that happened since the onset of the stroke symptoms.     Brent General, [name] 09/21/14 [place], MD 09/21/14 1536

## 2014-09-21 NOTE — ED Notes (Addendum)
Pt sts "it feels like I have water in my head every time the fan is on and my ears pop". Reports this has been ongoing for 2 months. Pt reports he had stroke 1 month ago. Speech is baseline from stroke, pt with R sided weakness from stroke. Denies pain. Pt is a dialysis pt. Fistula to L upper arm.

## 2014-09-23 ENCOUNTER — Encounter (HOSPITAL_COMMUNITY): Payer: Self-pay | Admitting: Surgery

## 2014-10-02 ENCOUNTER — Encounter (HOSPITAL_COMMUNITY): Payer: Self-pay | Admitting: Emergency Medicine

## 2014-10-02 ENCOUNTER — Emergency Department (HOSPITAL_COMMUNITY): Payer: Medicare Other

## 2014-10-02 ENCOUNTER — Emergency Department (HOSPITAL_COMMUNITY)
Admission: EM | Admit: 2014-10-02 | Discharge: 2014-10-02 | Disposition: A | Discharge status: Home / Self Care | Payer: Medicare Other | Attending: Emergency Medicine | Admitting: Emergency Medicine

## 2014-10-02 DIAGNOSIS — E785 Hyperlipidemia, unspecified: Secondary | ICD-10-CM | POA: Insufficient documentation

## 2014-10-02 DIAGNOSIS — E11649 Type 2 diabetes mellitus with hypoglycemia without coma: Secondary | ICD-10-CM | POA: Diagnosis not present

## 2014-10-02 DIAGNOSIS — Y92002 Bathroom of unspecified non-institutional (private) residence single-family (private) house as the place of occurrence of the external cause: Secondary | ICD-10-CM | POA: Insufficient documentation

## 2014-10-02 DIAGNOSIS — Y998 Other external cause status: Secondary | ICD-10-CM | POA: Diagnosis not present

## 2014-10-02 DIAGNOSIS — J441 Chronic obstructive pulmonary disease with (acute) exacerbation: Secondary | ICD-10-CM | POA: Diagnosis not present

## 2014-10-02 DIAGNOSIS — I509 Heart failure, unspecified: Secondary | ICD-10-CM | POA: Insufficient documentation

## 2014-10-02 DIAGNOSIS — Y9389 Activity, other specified: Secondary | ICD-10-CM | POA: Diagnosis not present

## 2014-10-02 DIAGNOSIS — Z992 Dependence on renal dialysis: Secondary | ICD-10-CM | POA: Insufficient documentation

## 2014-10-02 DIAGNOSIS — S99911A Unspecified injury of right ankle, initial encounter: Secondary | ICD-10-CM | POA: Diagnosis not present

## 2014-10-02 DIAGNOSIS — R410 Disorientation, unspecified: Secondary | ICD-10-CM | POA: Diagnosis not present

## 2014-10-02 DIAGNOSIS — Z9889 Other specified postprocedural states: Secondary | ICD-10-CM | POA: Diagnosis not present

## 2014-10-02 DIAGNOSIS — I2510 Atherosclerotic heart disease of native coronary artery without angina pectoris: Secondary | ICD-10-CM | POA: Insufficient documentation

## 2014-10-02 DIAGNOSIS — Z79899 Other long term (current) drug therapy: Secondary | ICD-10-CM | POA: Insufficient documentation

## 2014-10-02 DIAGNOSIS — Z8619 Personal history of other infectious and parasitic diseases: Secondary | ICD-10-CM | POA: Insufficient documentation

## 2014-10-02 DIAGNOSIS — Z7982 Long term (current) use of aspirin: Secondary | ICD-10-CM | POA: Diagnosis not present

## 2014-10-02 DIAGNOSIS — E669 Obesity, unspecified: Secondary | ICD-10-CM | POA: Insufficient documentation

## 2014-10-02 DIAGNOSIS — W1839XA Other fall on same level, initial encounter: Secondary | ICD-10-CM | POA: Diagnosis not present

## 2014-10-02 DIAGNOSIS — N186 End stage renal disease: Secondary | ICD-10-CM | POA: Diagnosis not present

## 2014-10-02 DIAGNOSIS — I120 Hypertensive chronic kidney disease with stage 5 chronic kidney disease or end stage renal disease: Secondary | ICD-10-CM | POA: Insufficient documentation

## 2014-10-02 DIAGNOSIS — Z7902 Long term (current) use of antithrombotics/antiplatelets: Secondary | ICD-10-CM | POA: Insufficient documentation

## 2014-10-02 DIAGNOSIS — Z87438 Personal history of other diseases of male genital organs: Secondary | ICD-10-CM | POA: Diagnosis not present

## 2014-10-02 DIAGNOSIS — Z8673 Personal history of transient ischemic attack (TIA), and cerebral infarction without residual deficits: Secondary | ICD-10-CM | POA: Diagnosis not present

## 2014-10-02 DIAGNOSIS — M25561 Pain in right knee: Secondary | ICD-10-CM | POA: Diagnosis not present

## 2014-10-02 DIAGNOSIS — Z87891 Personal history of nicotine dependence: Secondary | ICD-10-CM | POA: Insufficient documentation

## 2014-10-02 DIAGNOSIS — S8391XA Sprain of unspecified site of right knee, initial encounter: Secondary | ICD-10-CM | POA: Insufficient documentation

## 2014-10-02 DIAGNOSIS — S8991XA Unspecified injury of right lower leg, initial encounter: Secondary | ICD-10-CM | POA: Diagnosis present

## 2014-10-02 DIAGNOSIS — M109 Gout, unspecified: Secondary | ICD-10-CM | POA: Insufficient documentation

## 2014-10-02 DIAGNOSIS — Z794 Long term (current) use of insulin: Secondary | ICD-10-CM | POA: Insufficient documentation

## 2014-10-02 DIAGNOSIS — S79911A Unspecified injury of right hip, initial encounter: Secondary | ICD-10-CM | POA: Diagnosis not present

## 2014-10-02 LAB — CBG MONITORING, ED: Glucose-Capillary: 223 mg/dL — ABNORMAL HIGH (ref 70–99)

## 2014-10-02 MED ORDER — HYDROCODONE-ACETAMINOPHEN 5-325 MG PO TABS: 1.0000 | ORAL_TABLET | Freq: Four times a day (QID) | ORAL | Status: DC | PRN

## 2014-10-02 NOTE — ED Notes (Addendum)
Pt reports falling twice Thursday pm and landing on his knees the first time, then on his left side.  Pt reports no pain after fall until yesterday.  Pt reports pain from mid thigh distal to foot.  Pt states "I fell because my sugar was low."  Pt reports feeling weak and dizzy, all symptoms resolved.  Pt reports feeling "normal" yesterday.  Pt denies hitting head.  Pt takes Plavix and ASA.  NAD noted at this time.

## 2014-10-02 NOTE — Discharge Instructions (Signed)
RICE: Routine Care for Injuries The routine care of many injuries includes Rest, Ice, Compression, and Elevation (RICE). HOME CARE INSTRUCTIONS  Rest is needed to allow your body to heal. Routine activities can usually be resumed when comfortable. Injured tendons and bones can take up to 6 weeks to heal. Tendons are the cord-like structures that attach muscle to bone.  Ice following an injury helps keep the swelling down and reduces pain.  Put ice in a plastic bag.  Place a towel between your skin and the bag.  Leave the ice on for 15-20 minutes, 3-4 times a day, or as directed by your health care provider. Do this while awake, for the first 24 to 48 hours. After that, continue as directed by your caregiver.  Compression helps keep swelling down. It also gives support and helps with discomfort. If an elastic bandage has been applied, it should be removed and reapplied every 3 to 4 hours. It should not be applied tightly, but firmly enough to keep swelling down. Watch fingers or toes for swelling, bluish discoloration, coldness, numbness, or excessive pain. If any of these problems occur, remove the bandage and reapply loosely. Contact your caregiver if these problems continue.  Elevation helps reduce swelling and decreases pain. With extremities, such as the arms, hands, legs, and feet, the injured area should be placed near or above the level of the heart, if possible. SEEK IMMEDIATE MEDICAL CARE IF:  You have persistent pain and swelling.  You develop redness, numbness, or unexpected weakness.  Your symptoms are getting worse rather than improving after several days. These symptoms may indicate that further evaluation or further X-rays are needed. Sometimes, X-rays may not show a small broken bone (fracture) until 1 week or 10 days later. Make a follow-up appointment with your caregiver. Ask when your X-ray results will be ready. Make sure you get your X-ray results. Document Released:  01/13/2001 Document Revised: 10/06/2013 Document Reviewed: 03/02/2011 Exit[employer] Patient Information 2015 [employer]. This information is not intended to replace advice given to you by your health care provider. Make sure you discuss any questions you have with your health care provider.  

## 2014-10-02 NOTE — ED Provider Notes (Signed)
CSN: [id number]     Arrival date & time 10/02/14  0944 History   First MD Initiated Contact with Patient 10/02/14 1000     Chief Complaint  Patient presents with  . Leg Pain     (Consider location/radiation/quality/duration/timing/severity/associated sxs/prior Treatment) HPI  Pt is a 68yo male w/ history of DM and ESRD who presents with right leg pain after a fall on Thursday. His He reports he was leaving the bathroom and feel to his knees. His wife had to help him get up and then was trying to get him to his bed and the both fell, landing on his right leg/hip. His wife got him to bed and the next morning he walked on the leg, went to dialysis like normal but had increasing pain that was worse with weight bearing. He denies weakness, numbness tingling. His wife reports he was confused and hypoglycemic which caused him to fall. He did not hit his head. His wife reports he gets very confused when he is hypoglycemic and she checked his blood sugar immediately after the fall and it was low, but she doesn't remember how low exactly.  Past Medical History  Diagnosis Date  . Hyperlipidemia   . Retinopathy   . Gout   . ESRD (end stage renal disease)     Started HD in [place] in 2009, ESRD was due to DM. Moved to [facility] in Dec 2009 and now gets dialysis at [facility] on a MWF schedule.     . Type II or unspecified type diabetes mellitus without mention of complication, not stated as uncontrolled     adult onset  . Erectile dysfunction     penile implant  . Abnormal stress test     s/p cath November 2013 with modest disease involving the ostial left main, proximal LAD, proximal RCA - do not appear to be hemodynamically signficant; mild LV dysfunction  . Obesity   . Hypertension   . Hepatitis C   . COPD (chronic obstructive pulmonary disease)   . Coronary artery disease     per cath report 2013  . Ejection fraction < 50%   . Chronic systolic CHF (congestive heart failure)   . Bacteremia   .  Shortness of breath     Hx: of with exertion  . Gout     Hx: of  . Stroke    Past Surgical History  Procedure Laterality Date  . Penile prosthesis implant    . Fistula      RUE and wrist  . Cardiac catheterization  August 26, 2012  . Eye surgery      laser.  and surgery for DM  . Insertion of dialysis catheter Right 01/01/2013    Procedure: INSERTION OF DIALYSIS CATHETER right  internal jugular;  Surgeon: Serafina Mitchell, MD;  Location: [place];  Service: Vascular;  Laterality: Right;  . Av fistula placement Left 01/13/2013    Procedure: INSERTION OF ARTERIOVENOUS (AV) GORE-TEX GRAFT ARM;  Surgeon: Angelia Mould, MD;  Location: [place];  Service: Vascular;  Laterality: Left;  . Thrombectomy w/ embolectomy Left 03/26/2013    Procedure: Attempted thrombectomy of left arm arteriovenous goretex graft.;  Surgeon: Angelia Mould, MD;  Location: [place];  Service: Vascular;  Laterality: Left;  . Insertion of dialysis catheter N/A 03/26/2013    Procedure: INSERTION OF DIALYSIS CATHETER Left internal jugular vein;  Surgeon: Angelia Mould, MD;  Location: [place];  Service: Vascular;  Laterality: N/A;  .  Avgg removal Left 03/26/2013    Procedure: REMOVAL OF  NON INCORPORATED ARTERIOVENOUS GORETEX GRAFT ([place]) left arm * repair of  left brachial artery with vein patch angioplasty.;  Surgeon: Angelia Mould, MD;  Location: [place];  Service: Vascular;  Laterality: Left;  . Av fistula placement Left 05/05/2013    Procedure: INSERTION OF LEFT UPPER ARM  ARTERIOVENOUS GORTEX GRAFT;  Surgeon: Angelia Mould, MD;  Location: [place];  Service: Vascular;  Laterality: Left;  . Tee without cardioversion N/A 06/11/2013    Procedure: TRANSESOPHAGEAL ECHOCARDIOGRAM (TEE);  Surgeon: Larey Dresser, MD;  Location: [place];  Service: Cardiovascular;  Laterality: N/A;  . Embolectomy Right 08/27/2013    Procedure: EMBOLECTOMY;  Surgeon: Mal Misty, MD;  Location: [place];  Service:  Vascular;  Laterality: Right;  Thrombectomy of Radial and ulnar artery.  . Cataract extraction w/ intraocular lens  implant, bilateral    . Colonoscopy    . Ligation of arteriovenous  fistula Right 12/30/2013    Procedure: REMOVAL OF SEGMENT OF GORTEX GRAFT AND FISTULA  AND REPAIR OF BRACHIAL ARTERY;  Surgeon: Mal Misty, MD;  Location: [place];  Service: Vascular;  Laterality: Right;  Annell Greening N/A 04/07/2013    Procedure: VENOGRAM;  Surgeon: Serafina Mitchell, MD;  Location: [facility] CATH LAB;  Service: Cardiovascular;  Laterality: N/A;   Family History  Problem Relation Age of Onset  . Heart disease Father    History  Substance Use Topics  . Smoking status: Former Smoker -- 7 years    Quit date: 06/10/1973  . Smokeless tobacco: Never Used  . Alcohol Use: No    Review of Systems See HPI Denies fever, chest pain, SOB, abdominal pain, n/v/d.  Allergies  Review of patient's allergies indicates no known allergies.  Home Medications   Prior to Admission medications   Medication Sig Start Date End Date Taking? Authorizing Provider  allopurinol (ZYLOPRIM) 100 MG tablet Take 100 mg by mouth daily.    Yes Historical Provider, MD  amitriptyline (ELAVIL) 25 MG tablet TAKE ONE TABLET BY MOUTH AT BEDTIME FOR  NEUROPATHY 03/18/14  Yes Bronson Ing, DPM  aspirin EC 81 MG tablet Take 81 mg by mouth daily. 06/18/13  Yes Carlena Bjornstad, MD  Calcium Acetate 667 MG TABS Take 1,334 tablets by mouth 3 (three) times daily with meals.    Yes Historical Provider, MD  cinacalcet (SENSIPAR) 60 MG tablet Take 60 mg by mouth 2 (two) times daily.    Yes Historical Provider, MD  clopidogrel (PLAVIX) 75 MG tablet Take 75 mg by mouth daily.    Yes Historical Provider, MD  colchicine 0.6 MG tablet Take 0.6 mg by mouth daily as needed (for gout flare-ups).    Yes Historical Provider, MD  insulin NPH (HUMULIN N,NOVOLIN N) 100 UNIT/ML injection Inject 10 Units into the skin 2 (two) times daily.    Yes Historical  Provider, MD  insulin regular (NOVOLIN R,HUMULIN R) 100 units/mL injection Inject 30 Units into the skin 3 (three) times daily before meals.    Yes Historical Provider, MD  lidocaine-prilocaine (EMLA) cream Apply 1 application topically as needed (dialysis).   Yes Historical Provider, MD  metoprolol (LOPRESSOR) 50 MG tablet Take 50 mg by mouth 2 (two) times daily.    Yes Historical Provider, MD  multivitamin (RENA-VIT) TABS tablet Take 1 tablet by mouth daily.    Yes Historical Provider, MD  sevelamer (RENVELA) 800 MG tablet Take 800-3,200 mg by  mouth 5 (five) times daily. 4 tablets three times daily with meals and 1 tablet twice daily with snacks.   Yes Historical Provider, MD  Simethicone (GAS RELIEF PO) Take 1 tablet by mouth 3 (three) times daily with meals.   Yes Historical Provider, MD  simvastatin (ZOCOR) 20 MG tablet Take 20 mg by mouth every morning.    Yes Historical Provider, MD  albuterol (PROVENTIL HFA;VENTOLIN HFA) 108 (90 BASE) MCG/ACT inhaler Inhale 2 puffs into the lungs every 12 (twelve) hours as needed for wheezing.     Historical Provider, MD  HYDROcodone-acetaminophen (NORCO) 5-325 MG per tablet Take 1 tablet by mouth every 6 (six) hours as needed for moderate pain. 10/02/14   Frazier Richards, MD   BP 122/69 mmHg  Pulse 73  Temp(Src) 98.1 F (36.7 C) (Oral)  Resp 14  Ht 5\' 9"  (1.753 m)  Wt 245 lb (111.131 kg)  BMI 36.16 kg/m2  SpO2 98% Physical Exam  Constitutional: He is oriented to person, place, and time. He appears well-developed and well-nourished. No distress.  HENT:  Head: Normocephalic and atraumatic.  Eyes: Conjunctivae are normal. Right eye exhibits no discharge. Left eye exhibits no discharge. No scleral icterus.  Cardiovascular: Normal rate, regular rhythm, normal heart sounds and intact distal pulses.  Exam reveals no gallop and no friction rub.   No murmur heard. Pulmonary/Chest: Effort normal and breath sounds normal. No respiratory distress. He has no  wheezes.  Abdominal: Soft. Bowel sounds are normal. He exhibits no distension. There is no tenderness.  Musculoskeletal:       Right hip: Normal.       Right knee: He exhibits bony tenderness. He exhibits normal range of motion, no swelling, no ecchymosis, no deformity, no LCL laxity, normal patellar mobility and no MCL laxity. Tenderness found. Lateral joint line tenderness noted.       Right ankle: He exhibits normal range of motion, no swelling, no ecchymosis and no deformity. Tenderness. Lateral malleolus tenderness found. Achilles tendon normal.       Arms:      Legs: Leg length symmetric  Neurological: He is alert and oriented to person, place, and time.  Skin: Skin is warm and dry. No rash noted. He is not diaphoretic. No erythema.  Dry, flaky skin   Psychiatric: He has a normal mood and affect. His behavior is normal.  Nursing note and vitals reviewed.   ED Course  Procedures (including critical care time) Labs Review Labs Reviewed  CBG MONITORING, ED - Abnormal; Notable for the following:    Glucose-Capillary 223 (*)    All other components within normal limits    Imaging Review Dg Hip Complete Right  10/02/2014   CLINICAL DATA:  Pt reports falling twice Thursday night and landing on his knees the first time, then on his right side. Pt reports no pain after fall until yesterday after he left dialysis. Pt has limited range of motion in the right hip, knee and ankle.  EXAM: RIGHT HIP - COMPLETE 2+ VIEW  COMPARISON:  None.  FINDINGS: There is no evidence of hip fracture or dislocation. Minimal osteoarthritis of the right hip with tiny marginal osteophytes. There is peripheral vascular atherosclerotic disease.  Penile implants are noted.  IMPRESSION: No acute osseous injury of the right hip.   Electronically Signed   By: Kathreen Devoid   On: 10/02/2014 12:28   Dg Ankle Complete Right  10/02/2014   CLINICAL DATA:  Patient status post fall, landing on  knees. Limited range of motion  within the ankle.  EXAM: RIGHT ANKLE - COMPLETE 3+ VIEW  COMPARISON:  None.  FINDINGS: Normal anatomic alignment. No evidence for acute fracture or dislocation. Vascular calcifications. Otherwise regional soft tissues grossly unremarkable.  IMPRESSION: No acute osseous abnormality.   Electronically Signed   By: Lovey Newcomer M.D.   On: 10/02/2014 12:22   Dg Knee Complete 4 Views Right  10/02/2014   CLINICAL DATA:  Status post fall.  Knee pain.  EXAM: RIGHT KNEE - COMPLETE 4+ VIEW  COMPARISON:  None.  FINDINGS: Normal anatomic alignment. No evidence for acute fracture or dislocation. Vascular calcifications. Regional soft tissues grossly unremarkable.  IMPRESSION: No acute osseous abnormality.   Electronically Signed   By: Lovey Newcomer M.D.   On: 10/02/2014 12:26     EKG Interpretation None      MDM   Final diagnoses:  Knee sprain, right, initial encounter   Pain and tenderness of right knee and ankle after fall on Thursday. Concerning for fracture given bony tenderness and high risk patient. Will get xrays of knee and ankle as well as hip given possible referred pain and mechanism of fall.  Xrays negative. Will discharge with RICE therapy and norco for pain.  Frazier Richards, MD 10/02/14 [place], MD 10/02/14 1257

## 2014-10-14 DIAGNOSIS — Z992 Dependence on renal dialysis: Secondary | ICD-10-CM | POA: Diagnosis not present

## 2014-10-14 DIAGNOSIS — N186 End stage renal disease: Secondary | ICD-10-CM | POA: Diagnosis not present

## 2014-10-16 DIAGNOSIS — E119 Type 2 diabetes mellitus without complications: Secondary | ICD-10-CM | POA: Diagnosis not present

## 2014-10-16 DIAGNOSIS — N2581 Secondary hyperparathyroidism of renal origin: Secondary | ICD-10-CM | POA: Diagnosis not present

## 2014-10-16 DIAGNOSIS — N186 End stage renal disease: Secondary | ICD-10-CM | POA: Diagnosis not present

## 2014-10-16 DIAGNOSIS — D631 Anemia in chronic kidney disease: Secondary | ICD-10-CM | POA: Diagnosis not present

## 2014-10-16 DIAGNOSIS — D509 Iron deficiency anemia, unspecified: Secondary | ICD-10-CM | POA: Diagnosis not present

## 2014-10-18 DIAGNOSIS — D509 Iron deficiency anemia, unspecified: Secondary | ICD-10-CM | POA: Diagnosis not present

## 2014-10-18 DIAGNOSIS — N2581 Secondary hyperparathyroidism of renal origin: Secondary | ICD-10-CM | POA: Diagnosis not present

## 2014-10-18 DIAGNOSIS — D631 Anemia in chronic kidney disease: Secondary | ICD-10-CM | POA: Diagnosis not present

## 2014-10-18 DIAGNOSIS — N186 End stage renal disease: Secondary | ICD-10-CM | POA: Diagnosis not present

## 2014-10-18 DIAGNOSIS — E119 Type 2 diabetes mellitus without complications: Secondary | ICD-10-CM | POA: Diagnosis not present

## 2014-10-19 ENCOUNTER — Ambulatory Visit: Payer: Medicare Other | Attending: Audiology | Admitting: Audiology

## 2014-10-19 DIAGNOSIS — H9193 Unspecified hearing loss, bilateral: Secondary | ICD-10-CM | POA: Diagnosis not present

## 2014-10-19 DIAGNOSIS — H93293 Other abnormal auditory perceptions, bilateral: Secondary | ICD-10-CM | POA: Diagnosis not present

## 2014-10-19 DIAGNOSIS — H748X1 Other specified disorders of right middle ear and mastoid: Secondary | ICD-10-CM | POA: Diagnosis not present

## 2014-10-19 DIAGNOSIS — H903 Sensorineural hearing loss, bilateral: Secondary | ICD-10-CM

## 2014-10-20 DIAGNOSIS — N186 End stage renal disease: Secondary | ICD-10-CM | POA: Diagnosis not present

## 2014-10-20 DIAGNOSIS — E119 Type 2 diabetes mellitus without complications: Secondary | ICD-10-CM | POA: Diagnosis not present

## 2014-10-20 DIAGNOSIS — N2581 Secondary hyperparathyroidism of renal origin: Secondary | ICD-10-CM | POA: Diagnosis not present

## 2014-10-20 DIAGNOSIS — D631 Anemia in chronic kidney disease: Secondary | ICD-10-CM | POA: Diagnosis not present

## 2014-10-20 DIAGNOSIS — D509 Iron deficiency anemia, unspecified: Secondary | ICD-10-CM | POA: Diagnosis not present

## 2014-10-20 NOTE — Procedures (Signed)
[facility] [address] [place], [place] [address] [place] EVALUATION  Name: Ernest [name] DOB:  1945-12-31 MRN:  [id number]                                 Diagnosis: Tinnitus Date: 10/19/2014    Referent: Louis Meckel, MD  HISTORY: Ernest [name], age 69 y.o. years, was seen for an audiological evaluation.  He was referred for "tinnitus" and "hearing loss."  Ernest [name] states that he had "a right sided stroke about 3 months ago" and has noticed "much more trouble hearing and understanding".    Ernest [name] also reports that when he turns his head "he hears/feels a slushing sound" on the right side.   Ernest [name] has had "dialysis for the past 6 years".           EVALUATION: Pure tone air and tone conduction was completed using conventional audiometry with inserts.  Hearing threshold are 30-35 dBHL from 250Hz - 1000Hz; 45 dBHL at 2000Hz; 60-70 dBHL at 4000Hz and 75 dBHL at 8000Hz.  The hearing loss is slightly poorer in the right ear high frequencies.  Speech reception thresholds are 35 dBHL in each ear using recorded spondee words.  The reliability is good. Word recognition is 56% at 75dBHL in the right and 82% at 70dBHL in the left using recorded NU-6 word lists in quiet. IOtoscopic inspection reveals clear ear canals with visible tympanic membranes.  Tympanometry was abnormal and flat with no peak on the right side (Type B).  The left ear was within normal limits (Type A).  Ipsilateral acoustic reflexes on the left side are present and within normal limits.   CONCLUSION:      Ernest [name] has a mild low frequency hearing loss sloping to a moderate to severe high frequency hearing loss bilaterally.  Although the low frequency loss is symmetrical, the right ear is slightly poorer in the high frequencies.  The hearing loss appears primarily sensorineural although there may be a slight conductive component on the  right side- he has abnormal middle ear function on the right side.  Word recognition is asymmetrical and is poor on the right side and good on the left side  in quiet at louder than average conversational speech levels.Ernest [name] may be a candidate for amplification; however, first he needs further evaluation by an ENT with medical clearance because of 1) poor right sided word recognition 2) poor middle ear function on the right side 3) slushing sound on right side when he moves his head and 4) recent history of right sided stroke.   RECOMMENDATIONS: 1.   Further evaluation by an ENT for 1) poor right sided word recognition 2) poor middle ear function on the right side 3) right side "slushing sound" and 4) recent history of right sided stroke.  2.   A hearing aid evaluation after medical clearance is obtained from ENT.         3.   Other self-help measures include:    a) have conversation face to face   b) minimize background noise when having a conversation- turn off the TV, move to a quiet area of the area   c) be aware that auditory processing problems become worse with fatigue and stress   d) Avoid having important conversation With [name] back is to the speaker.  4.  [employer] in [place] may help with obtaining one hearing aid or one captioned telephone if hearing loss and financial qualifications are met.  Please contact Ernest [name] at [phone] . Please note that the following providers work with this program.  [facility]  [phone] and [place]  [phone]  [facility] Ernest [name] [phone] and [place]  [place]   [place]. Heide Spark, Au.D., CCC-A  Doctor of Audiology 10/20/2014  cc: Louis Meckel, MD

## 2014-10-22 DIAGNOSIS — D509 Iron deficiency anemia, unspecified: Secondary | ICD-10-CM | POA: Diagnosis not present

## 2014-10-22 DIAGNOSIS — D631 Anemia in chronic kidney disease: Secondary | ICD-10-CM | POA: Diagnosis not present

## 2014-10-22 DIAGNOSIS — N186 End stage renal disease: Secondary | ICD-10-CM | POA: Diagnosis not present

## 2014-10-22 DIAGNOSIS — E119 Type 2 diabetes mellitus without complications: Secondary | ICD-10-CM | POA: Diagnosis not present

## 2014-10-22 DIAGNOSIS — N2581 Secondary hyperparathyroidism of renal origin: Secondary | ICD-10-CM | POA: Diagnosis not present

## 2014-10-25 DIAGNOSIS — D631 Anemia in chronic kidney disease: Secondary | ICD-10-CM | POA: Diagnosis not present

## 2014-10-25 DIAGNOSIS — E119 Type 2 diabetes mellitus without complications: Secondary | ICD-10-CM | POA: Diagnosis not present

## 2014-10-25 DIAGNOSIS — N2581 Secondary hyperparathyroidism of renal origin: Secondary | ICD-10-CM | POA: Diagnosis not present

## 2014-10-25 DIAGNOSIS — D509 Iron deficiency anemia, unspecified: Secondary | ICD-10-CM | POA: Diagnosis not present

## 2014-10-25 DIAGNOSIS — N186 End stage renal disease: Secondary | ICD-10-CM | POA: Diagnosis not present

## 2014-10-27 DIAGNOSIS — N186 End stage renal disease: Secondary | ICD-10-CM | POA: Diagnosis not present

## 2014-10-27 DIAGNOSIS — N2581 Secondary hyperparathyroidism of renal origin: Secondary | ICD-10-CM | POA: Diagnosis not present

## 2014-10-27 DIAGNOSIS — D631 Anemia in chronic kidney disease: Secondary | ICD-10-CM | POA: Diagnosis not present

## 2014-10-27 DIAGNOSIS — D509 Iron deficiency anemia, unspecified: Secondary | ICD-10-CM | POA: Diagnosis not present

## 2014-10-27 DIAGNOSIS — E119 Type 2 diabetes mellitus without complications: Secondary | ICD-10-CM | POA: Diagnosis not present

## 2014-10-29 DIAGNOSIS — N186 End stage renal disease: Secondary | ICD-10-CM | POA: Diagnosis not present

## 2014-10-29 DIAGNOSIS — N2581 Secondary hyperparathyroidism of renal origin: Secondary | ICD-10-CM | POA: Diagnosis not present

## 2014-10-29 DIAGNOSIS — D509 Iron deficiency anemia, unspecified: Secondary | ICD-10-CM | POA: Diagnosis not present

## 2014-10-29 DIAGNOSIS — D631 Anemia in chronic kidney disease: Secondary | ICD-10-CM | POA: Diagnosis not present

## 2014-10-29 DIAGNOSIS — E119 Type 2 diabetes mellitus without complications: Secondary | ICD-10-CM | POA: Diagnosis not present

## 2014-11-01 DIAGNOSIS — D509 Iron deficiency anemia, unspecified: Secondary | ICD-10-CM | POA: Diagnosis not present

## 2014-11-01 DIAGNOSIS — N186 End stage renal disease: Secondary | ICD-10-CM | POA: Diagnosis not present

## 2014-11-01 DIAGNOSIS — D631 Anemia in chronic kidney disease: Secondary | ICD-10-CM | POA: Diagnosis not present

## 2014-11-01 DIAGNOSIS — E119 Type 2 diabetes mellitus without complications: Secondary | ICD-10-CM | POA: Diagnosis not present

## 2014-11-01 DIAGNOSIS — N2581 Secondary hyperparathyroidism of renal origin: Secondary | ICD-10-CM | POA: Diagnosis not present

## 2014-11-03 DIAGNOSIS — N186 End stage renal disease: Secondary | ICD-10-CM | POA: Diagnosis not present

## 2014-11-03 DIAGNOSIS — D631 Anemia in chronic kidney disease: Secondary | ICD-10-CM | POA: Diagnosis not present

## 2014-11-03 DIAGNOSIS — D509 Iron deficiency anemia, unspecified: Secondary | ICD-10-CM | POA: Diagnosis not present

## 2014-11-03 DIAGNOSIS — E119 Type 2 diabetes mellitus without complications: Secondary | ICD-10-CM | POA: Diagnosis not present

## 2014-11-03 DIAGNOSIS — N2581 Secondary hyperparathyroidism of renal origin: Secondary | ICD-10-CM | POA: Diagnosis not present

## 2014-11-05 DIAGNOSIS — N186 End stage renal disease: Secondary | ICD-10-CM | POA: Diagnosis not present

## 2014-11-05 DIAGNOSIS — D509 Iron deficiency anemia, unspecified: Secondary | ICD-10-CM | POA: Diagnosis not present

## 2014-11-05 DIAGNOSIS — E119 Type 2 diabetes mellitus without complications: Secondary | ICD-10-CM | POA: Diagnosis not present

## 2014-11-05 DIAGNOSIS — N2581 Secondary hyperparathyroidism of renal origin: Secondary | ICD-10-CM | POA: Diagnosis not present

## 2014-11-05 DIAGNOSIS — D631 Anemia in chronic kidney disease: Secondary | ICD-10-CM | POA: Diagnosis not present

## 2014-11-08 DIAGNOSIS — D509 Iron deficiency anemia, unspecified: Secondary | ICD-10-CM | POA: Diagnosis not present

## 2014-11-08 DIAGNOSIS — D631 Anemia in chronic kidney disease: Secondary | ICD-10-CM | POA: Diagnosis not present

## 2014-11-08 DIAGNOSIS — E119 Type 2 diabetes mellitus without complications: Secondary | ICD-10-CM | POA: Diagnosis not present

## 2014-11-08 DIAGNOSIS — N186 End stage renal disease: Secondary | ICD-10-CM | POA: Diagnosis not present

## 2014-11-08 DIAGNOSIS — N2581 Secondary hyperparathyroidism of renal origin: Secondary | ICD-10-CM | POA: Diagnosis not present

## 2014-11-09 ENCOUNTER — Encounter: Payer: Self-pay | Admitting: Neurology

## 2014-11-09 ENCOUNTER — Telehealth: Payer: Self-pay | Admitting: Neurology

## 2014-11-09 ENCOUNTER — Ambulatory Visit (INDEPENDENT_AMBULATORY_CARE_PROVIDER_SITE_OTHER): Payer: Medicare Other | Admitting: Neurology

## 2014-11-09 ENCOUNTER — Other Ambulatory Visit (HOSPITAL_COMMUNITY): Payer: Medicare Other

## 2014-11-09 VITALS — BP 128/70 | HR 70 | Temp 98.2°F | Resp 20 | Ht 69.0 in | Wt 249.5 lb

## 2014-11-09 DIAGNOSIS — I639 Cerebral infarction, unspecified: Secondary | ICD-10-CM | POA: Diagnosis not present

## 2014-11-09 DIAGNOSIS — H903 Sensorineural hearing loss, bilateral: Secondary | ICD-10-CM | POA: Diagnosis not present

## 2014-11-09 NOTE — Patient Instructions (Addendum)
1.  Continue Plavix 2.  Continue simvastatin.  Will check fasting lipid panel to see if medication or dose needs adjustment 3.  Weight loss (Mediterranean diet) 4.  Check carotid doppler  [employer] A  11/10/1609:45am 5.  Referral to ear nose and throat 6.  Follow up in 3 months.

## 2014-11-09 NOTE — Progress Notes (Signed)
NEUROLOGY CONSULTATION NOTE  ACEA HOILAND MRN: [id number] DOB: 1946-08-12  Referring provider: Veryl Speak, MD Primary care provider: NONE (searching for one now but currently covered by his nephrologist)  Reason for consult:  Stroke  HISTORY OF PRESENT ILLNESS: Ernest [name] is a 69 year old right-handed man with hyperlipidemia, ESRD, type II diabetes mellitus, hypertension, COPD, gout, chronic CHF (EF 30-35%), coronary artery disease, hepatitis C and history of stroke who presents for stroke-like symptoms.  Records, labs and head CT reviewed.  He is accompanied by his wife who provides some history.  Around October, he developed slurred speech.  He also experienced ringing in his right ear, with sensation of water in his right ear.  He did not seek medical attention at the time.  There was no facial droop or difficulty walking or swallowing.  He decided to go to the ED on 09/21/14 for an evaluation as the slurred speech and tinnitus persisted.  He denied weakness but ED exam revealed right arm weakness.  CT of the head was performed, which revealed chronic small vessel ischemic changes, including remote right cerebellar, left pontine and right basal ganglia lacunar infarcts.  No acute infarction was seen.  BMP revealed BUN of 50 and creatine of 9.8 with GFR of 6.   CBC was unremarkable.  He already takes Plavix.  TEE from 06/11/13 showed LVEF of 30%.  Hgb A1c from 06/10/13 was 6.6.  He did see the audiologist on 10/20/14.  Exam revealed primarily sensorineural low-frequency hearing loss bilaterally, with poor high frequency hearing loss in the right ear with possible conductive/middle ear component.  Evaluation by ENT was recommended.  PAST MEDICAL HISTORY: Past Medical History  Diagnosis Date  . Hyperlipidemia   . Retinopathy   . Gout   . ESRD (end stage renal disease)     Started HD in [place] in 2009, ESRD was due to DM. Moved to [facility] in Dec 2009 and now gets dialysis at [facility] on  a MWF schedule.     . Type II or unspecified type diabetes mellitus without mention of complication, not stated as uncontrolled     adult onset  . Erectile dysfunction     penile implant  . Abnormal stress test     s/p cath November 2013 with modest disease involving the ostial left main, proximal LAD, proximal RCA - do not appear to be hemodynamically signficant; mild LV dysfunction  . Obesity   . Hypertension   . Hepatitis C   . COPD (chronic obstructive pulmonary disease)   . Coronary artery disease     per cath report 2013  . Ejection fraction < 50%   . Chronic systolic CHF (congestive heart failure)   . Bacteremia   . Shortness of breath     Hx: of with exertion  . Gout     Hx: of  . Stroke     PAST SURGICAL HISTORY: Past Surgical History  Procedure Laterality Date  . Penile prosthesis implant    . Fistula      RUE and wrist  . Cardiac catheterization  August 26, 2012  . Eye surgery      laser.  and surgery for DM  . Insertion of dialysis catheter Right 01/01/2013    Procedure: INSERTION OF DIALYSIS CATHETER right  internal jugular;  Surgeon: Serafina Mitchell, MD;  Location: [place];  Service: Vascular;  Laterality: Right;  . Av fistula placement Left 01/13/2013    Procedure: INSERTION  OF ARTERIOVENOUS (AV) GORE-TEX GRAFT ARM;  Surgeon: Angelia Mould, MD;  Location: [place];  Service: Vascular;  Laterality: Left;  . Thrombectomy w/ embolectomy Left 03/26/2013    Procedure: Attempted thrombectomy of left arm arteriovenous goretex graft.;  Surgeon: Angelia Mould, MD;  Location: [place];  Service: Vascular;  Laterality: Left;  . Insertion of dialysis catheter N/A 03/26/2013    Procedure: INSERTION OF DIALYSIS CATHETER Left internal jugular vein;  Surgeon: Angelia Mould, MD;  Location: [place];  Service: Vascular;  Laterality: N/A;  . Avgg removal Left 03/26/2013    Procedure: REMOVAL OF  NON INCORPORATED ARTERIOVENOUS GORETEX GRAFT ([place]) left arm * repair of   left brachial artery with vein patch angioplasty.;  Surgeon: Angelia Mould, MD;  Location: [place];  Service: Vascular;  Laterality: Left;  . Av fistula placement Left 05/05/2013    Procedure: INSERTION OF LEFT UPPER ARM  ARTERIOVENOUS GORTEX GRAFT;  Surgeon: Angelia Mould, MD;  Location: [place];  Service: Vascular;  Laterality: Left;  . Tee without cardioversion N/A 06/11/2013    Procedure: TRANSESOPHAGEAL ECHOCARDIOGRAM (TEE);  Surgeon: Larey Dresser, MD;  Location: [place];  Service: Cardiovascular;  Laterality: N/A;  . Embolectomy Right 08/27/2013    Procedure: EMBOLECTOMY;  Surgeon: Mal Misty, MD;  Location: [place];  Service: Vascular;  Laterality: Right;  Thrombectomy of Radial and ulnar artery.  . Cataract extraction w/ intraocular lens  implant, bilateral    . Colonoscopy    . Ligation of arteriovenous  fistula Right 12/30/2013    Procedure: REMOVAL OF SEGMENT OF GORTEX GRAFT AND FISTULA  AND REPAIR OF BRACHIAL ARTERY;  Surgeon: Mal Misty, MD;  Location: [place];  Service: Vascular;  Laterality: Right;  Annell Greening N/A 04/07/2013    Procedure: VENOGRAM;  Surgeon: Serafina Mitchell, MD;  Location: [facility] CATH LAB;  Service: Cardiovascular;  Laterality: N/A;    MEDICATIONS: Current Outpatient Prescriptions on File Prior to Visit  Medication Sig Dispense Refill  . albuterol (PROVENTIL HFA;VENTOLIN HFA) 108 (90 BASE) MCG/ACT inhaler Inhale 2 puffs into the lungs every 12 (twelve) hours as needed for wheezing.     [name] allopurinol (ZYLOPRIM) 100 MG tablet Take 100 mg by mouth daily.     [name] amitriptyline (ELAVIL) 25 MG tablet TAKE ONE TABLET BY MOUTH AT BEDTIME FOR  NEUROPATHY 90 tablet 0  . aspirin EC 81 MG tablet Take 81 mg by mouth daily.    . Calcium Acetate 667 MG TABS Take 1,334 tablets by mouth 3 (three) times daily with meals.     . cinacalcet (SENSIPAR) 60 MG tablet Take 60 mg by mouth 2 (two) times daily.     . clopidogrel (PLAVIX) 75 MG tablet Take 75 mg by mouth  daily.     . colchicine 0.6 MG tablet Take 0.6 mg by mouth daily as needed (for gout flare-ups).     . insulin NPH (HUMULIN N,NOVOLIN N) 100 UNIT/ML injection Inject 10 Units into the skin 2 (two) times daily.     . insulin regular (NOVOLIN R,HUMULIN R) 100 units/mL injection Inject 30 Units into the skin 3 (three) times daily before meals.     . lidocaine-prilocaine (EMLA) cream Apply 1 application topically as needed (dialysis).    . metoprolol (LOPRESSOR) 50 MG tablet Take 50 mg by mouth 2 (two) times daily.     . multivitamin (RENA-VIT) TABS tablet Take 1 tablet by mouth daily.     . sevelamer (  RENVELA) 800 MG tablet Take 800-3,200 mg by mouth 5 (five) times daily. 4 tablets three times daily with meals and 1 tablet twice daily with snacks.    . Simethicone (GAS RELIEF PO) Take 1 tablet by mouth 3 (three) times daily with meals.    . simvastatin (ZOCOR) 20 MG tablet Take 20 mg by mouth every morning.     [name] HYDROcodone-acetaminophen (NORCO) 5-325 MG per tablet Take 1 tablet by mouth every 6 (six) hours as needed for moderate pain. (Patient not taking: Reported on 11/09/2014) 10 tablet 0   No current facility-administered medications on file prior to visit.    ALLERGIES: [name]  FAMILY HISTORY: Family History  Problem Relation Age of Onset  . Heart disease Father   . Diabetes Sister   . Diabetes Brother   . Diabetes Son     SOCIAL HISTORY: History   Social History  . Marital Status: Married    Spouse Name: N/A    Number of Children: N/A  . Years of Education: N/A   Occupational History  . 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 Smoking status: Former Smoker -- 7 years    Quit date: 06/10/1973  . Smokeless tobacco: Never Used  . Alcohol Use: No  . Drug Use: No  . Sexual Activity: No   Other Topics Concern  . Not on file   Social History Narrative   Adopted mentally retarded child at home, 2 adopted children, 3 living and 2 deceased.      REVIEW OF SYSTEMS: Constitutional: No fevers, chills, or sweats, no generalized fatigue, change in appetite Eyes: No visual changes, double vision, eye pain Ear, nose and throat: No hearing loss, ear pain, nasal congestion, sore throat Cardiovascular: No chest pain, palpitations Respiratory:  No shortness of breath at rest or with exertion, wheezes GastrointestinaI: No nausea, vomiting, diarrhea, abdominal pain, fecal incontinence Genitourinary:  No dysuria, urinary retention or frequency Musculoskeletal:  No neck pain, back pain Integumentary: No rash, pruritus, skin lesions Neurological: as above Psychiatric: No depression, insomnia, anxiety Endocrine: No palpitations, fatigue, diaphoresis, mood swings, change in appetite, change in weight, increased thirst Hematologic/Lymphatic:  No anemia, purpura, petechiae. Allergic/Immunologic: no itchy/runny eyes, nasal congestion, recent allergic reactions, rashes  PHYSICAL EXAM: Filed Vitals:   11/09/14 0831  BP: 128/70  Pulse: 70  Temp: 98.2 F (36.8 C)  Resp: 20   General: No acute distress Head:  Normocephalic/atraumatic Eyes:  fundi inspected but not visualized Neck: supple, no paraspinal tenderness, full range of motion Back: No paraspinal tenderness Heart: regular rate and rhythm Lungs: Clear to auscultation bilaterally. Vascular: No carotid bruits. Neurological Exam: Mental status: alert and oriented to person, place, and time, recent and remote memory intact, fund of knowledge intact, attention and concentration intact, speech fluent and not dysarthric, language intact. Cranial nerves: CN I: not tested CN II: pupils equal, round and reactive to light, right upper quandrantanopia, fundi not visualized CN III, IV, VI:  full range of motion, no nystagmus, no ptosis CN V: facial sensation intact CN VII: upper and lower face symmetric CN VIII: hearing reduced bilaterally CN IX, X: gag intact, uvula midline CN XI:  sternocleidomastoid and trapezius muscles intact CN XII: tongue midline Bulk & Tone: normal, no fasciculations. Motor:  5/5 throughout Sensation:  Reduced pinprick sensation in hands and lower extremities up to above the knees.  Reduced vibration sensation in feet. Deep Tendon Reflexes:  2+ in upper  extremities, 1+ in patellars, absent in ankles.  Toes downgoing. Finger to nose testing:  Kinetic tremor but no dysmetria Heel to shin:  No dysmetria Gait:  Mildly wide-based, no ataxia.  Some difficulty with tandem walking. Romberg negative.  IMPRESSION: Probable CVA of unspecified mechanism Bilateral sensorineural hearing loss Right ear tinnitus Right upper quandrantanopia, likely sequela of stroke.  PLAN: 1.  Continue Plavix 2.  Continue simvastatin 20mg .  Check fasting lipid panel (LDL goal should be less than 70) 3.  Check carotid doppler 4.  Mediterranean diet 5.  Weight loss 6.  Referral to ENT 7.  Follow up in 3 months.  Thank you for allowing me to take part in the care of this patient.  Metta Clines, DO  CC:  Corliss Parish (Nephrologist)

## 2014-11-09 NOTE — Telephone Encounter (Signed)
Ulis Rias from Dr Claria Dice ENT office called and states that pt as appt for 11-11-14 at 10:45

## 2014-11-10 ENCOUNTER — Ambulatory Visit (HOSPITAL_COMMUNITY)
Admission: RE | Admit: 2014-11-10 | Discharge: 2014-11-10 | Disposition: A | Discharge status: Home / Self Care | Payer: Medicare Other | Source: Ambulatory Visit | Attending: Neurology | Admitting: Neurology

## 2014-11-10 DIAGNOSIS — E669 Obesity, unspecified: Secondary | ICD-10-CM | POA: Insufficient documentation

## 2014-11-10 DIAGNOSIS — N2581 Secondary hyperparathyroidism of renal origin: Secondary | ICD-10-CM | POA: Diagnosis not present

## 2014-11-10 DIAGNOSIS — I639 Cerebral infarction, unspecified: Secondary | ICD-10-CM | POA: Insufficient documentation

## 2014-11-10 DIAGNOSIS — E1129 Type 2 diabetes mellitus with other diabetic kidney complication: Secondary | ICD-10-CM | POA: Diagnosis not present

## 2014-11-10 DIAGNOSIS — H903 Sensorineural hearing loss, bilateral: Secondary | ICD-10-CM

## 2014-11-10 DIAGNOSIS — D509 Iron deficiency anemia, unspecified: Secondary | ICD-10-CM | POA: Diagnosis not present

## 2014-11-10 DIAGNOSIS — E119 Type 2 diabetes mellitus without complications: Secondary | ICD-10-CM | POA: Diagnosis not present

## 2014-11-10 DIAGNOSIS — N186 End stage renal disease: Secondary | ICD-10-CM | POA: Diagnosis not present

## 2014-11-10 DIAGNOSIS — D631 Anemia in chronic kidney disease: Secondary | ICD-10-CM | POA: Diagnosis not present

## 2014-11-10 NOTE — Progress Notes (Signed)
Bilateral carotid artery duplex completed:  1-39% ICA stenosis.  Vertebral artery flow is antegrade.     

## 2014-11-11 DIAGNOSIS — H9311 Tinnitus, right ear: Secondary | ICD-10-CM | POA: Diagnosis not present

## 2014-11-11 DIAGNOSIS — H903 Sensorineural hearing loss, bilateral: Secondary | ICD-10-CM | POA: Diagnosis not present

## 2014-11-11 DIAGNOSIS — H9313 Tinnitus, bilateral: Secondary | ICD-10-CM | POA: Diagnosis not present

## 2014-11-12 DIAGNOSIS — D631 Anemia in chronic kidney disease: Secondary | ICD-10-CM | POA: Diagnosis not present

## 2014-11-12 DIAGNOSIS — D509 Iron deficiency anemia, unspecified: Secondary | ICD-10-CM | POA: Diagnosis not present

## 2014-11-12 DIAGNOSIS — E119 Type 2 diabetes mellitus without complications: Secondary | ICD-10-CM | POA: Diagnosis not present

## 2014-11-12 DIAGNOSIS — N2581 Secondary hyperparathyroidism of renal origin: Secondary | ICD-10-CM | POA: Diagnosis not present

## 2014-11-12 DIAGNOSIS — N186 End stage renal disease: Secondary | ICD-10-CM | POA: Diagnosis not present

## 2014-11-14 DIAGNOSIS — N186 End stage renal disease: Secondary | ICD-10-CM | POA: Diagnosis not present

## 2014-11-14 DIAGNOSIS — Z992 Dependence on renal dialysis: Secondary | ICD-10-CM | POA: Diagnosis not present

## 2014-11-15 DIAGNOSIS — N2581 Secondary hyperparathyroidism of renal origin: Secondary | ICD-10-CM | POA: Diagnosis not present

## 2014-11-15 DIAGNOSIS — D509 Iron deficiency anemia, unspecified: Secondary | ICD-10-CM | POA: Diagnosis not present

## 2014-11-15 DIAGNOSIS — D631 Anemia in chronic kidney disease: Secondary | ICD-10-CM | POA: Diagnosis not present

## 2014-11-15 DIAGNOSIS — E119 Type 2 diabetes mellitus without complications: Secondary | ICD-10-CM | POA: Diagnosis not present

## 2014-11-15 DIAGNOSIS — N186 End stage renal disease: Secondary | ICD-10-CM | POA: Diagnosis not present

## 2014-11-16 ENCOUNTER — Telehealth: Payer: Self-pay | Admitting: *Deleted

## 2014-11-16 NOTE — Telephone Encounter (Signed)
Left message normal Doppler

## 2014-11-17 DIAGNOSIS — D509 Iron deficiency anemia, unspecified: Secondary | ICD-10-CM | POA: Diagnosis not present

## 2014-11-17 DIAGNOSIS — N186 End stage renal disease: Secondary | ICD-10-CM | POA: Diagnosis not present

## 2014-11-17 DIAGNOSIS — N2581 Secondary hyperparathyroidism of renal origin: Secondary | ICD-10-CM | POA: Diagnosis not present

## 2014-11-17 DIAGNOSIS — E119 Type 2 diabetes mellitus without complications: Secondary | ICD-10-CM | POA: Diagnosis not present

## 2014-11-17 DIAGNOSIS — D631 Anemia in chronic kidney disease: Secondary | ICD-10-CM | POA: Diagnosis not present

## 2014-11-18 DIAGNOSIS — E780 Pure hypercholesterolemia: Secondary | ICD-10-CM | POA: Diagnosis not present

## 2014-11-18 DIAGNOSIS — E1165 Type 2 diabetes mellitus with hyperglycemia: Secondary | ICD-10-CM | POA: Diagnosis not present

## 2014-11-18 DIAGNOSIS — I10 Essential (primary) hypertension: Secondary | ICD-10-CM | POA: Diagnosis not present

## 2014-11-19 DIAGNOSIS — D509 Iron deficiency anemia, unspecified: Secondary | ICD-10-CM | POA: Diagnosis not present

## 2014-11-19 DIAGNOSIS — N186 End stage renal disease: Secondary | ICD-10-CM | POA: Diagnosis not present

## 2014-11-19 DIAGNOSIS — E119 Type 2 diabetes mellitus without complications: Secondary | ICD-10-CM | POA: Diagnosis not present

## 2014-11-19 DIAGNOSIS — D631 Anemia in chronic kidney disease: Secondary | ICD-10-CM | POA: Diagnosis not present

## 2014-11-19 DIAGNOSIS — N2581 Secondary hyperparathyroidism of renal origin: Secondary | ICD-10-CM | POA: Diagnosis not present

## 2014-11-22 DIAGNOSIS — N186 End stage renal disease: Secondary | ICD-10-CM | POA: Diagnosis not present

## 2014-11-22 DIAGNOSIS — E119 Type 2 diabetes mellitus without complications: Secondary | ICD-10-CM | POA: Diagnosis not present

## 2014-11-22 DIAGNOSIS — D509 Iron deficiency anemia, unspecified: Secondary | ICD-10-CM | POA: Diagnosis not present

## 2014-11-22 DIAGNOSIS — N2581 Secondary hyperparathyroidism of renal origin: Secondary | ICD-10-CM | POA: Diagnosis not present

## 2014-11-22 DIAGNOSIS — D631 Anemia in chronic kidney disease: Secondary | ICD-10-CM | POA: Diagnosis not present

## 2014-11-23 DIAGNOSIS — Z992 Dependence on renal dialysis: Secondary | ICD-10-CM | POA: Diagnosis not present

## 2014-11-23 DIAGNOSIS — I871 Compression of vein: Secondary | ICD-10-CM | POA: Diagnosis not present

## 2014-11-23 DIAGNOSIS — T82858D Stenosis of vascular prosthetic devices, implants and grafts, subsequent encounter: Secondary | ICD-10-CM | POA: Diagnosis not present

## 2014-11-23 DIAGNOSIS — N189 Chronic kidney disease, unspecified: Secondary | ICD-10-CM | POA: Diagnosis not present

## 2014-11-24 DIAGNOSIS — E119 Type 2 diabetes mellitus without complications: Secondary | ICD-10-CM | POA: Diagnosis not present

## 2014-11-24 DIAGNOSIS — D631 Anemia in chronic kidney disease: Secondary | ICD-10-CM | POA: Diagnosis not present

## 2014-11-24 DIAGNOSIS — N2581 Secondary hyperparathyroidism of renal origin: Secondary | ICD-10-CM | POA: Diagnosis not present

## 2014-11-24 DIAGNOSIS — N186 End stage renal disease: Secondary | ICD-10-CM | POA: Diagnosis not present

## 2014-11-24 DIAGNOSIS — D509 Iron deficiency anemia, unspecified: Secondary | ICD-10-CM | POA: Diagnosis not present

## 2014-11-25 DIAGNOSIS — E1165 Type 2 diabetes mellitus with hyperglycemia: Secondary | ICD-10-CM | POA: Diagnosis not present

## 2014-11-25 DIAGNOSIS — N186 End stage renal disease: Secondary | ICD-10-CM | POA: Diagnosis not present

## 2014-11-25 DIAGNOSIS — H903 Sensorineural hearing loss, bilateral: Secondary | ICD-10-CM | POA: Diagnosis not present

## 2014-11-25 DIAGNOSIS — I639 Cerebral infarction, unspecified: Secondary | ICD-10-CM | POA: Diagnosis not present

## 2014-11-25 DIAGNOSIS — I10 Essential (primary) hypertension: Secondary | ICD-10-CM | POA: Diagnosis not present

## 2014-11-25 DIAGNOSIS — E780 Pure hypercholesterolemia: Secondary | ICD-10-CM | POA: Diagnosis not present

## 2014-11-25 LAB — LIPID PANEL
Cholesterol: 116 mg/dL (ref 0–200)
HDL: 59 mg/dL (ref >39)
LDL Cholesterol: 45 mg/dL (ref 0–99)
Total CHOL/HDL Ratio: 2 Ratio
Triglycerides: 58 mg/dL (ref <150)
VLDL: 12 mg/dL (ref 0–40)

## 2014-11-26 ENCOUNTER — Telehealth: Payer: Self-pay | Admitting: *Deleted

## 2014-11-26 DIAGNOSIS — E119 Type 2 diabetes mellitus without complications: Secondary | ICD-10-CM | POA: Diagnosis not present

## 2014-11-26 DIAGNOSIS — N186 End stage renal disease: Secondary | ICD-10-CM | POA: Diagnosis not present

## 2014-11-26 DIAGNOSIS — D509 Iron deficiency anemia, unspecified: Secondary | ICD-10-CM | POA: Diagnosis not present

## 2014-11-26 DIAGNOSIS — D631 Anemia in chronic kidney disease: Secondary | ICD-10-CM | POA: Diagnosis not present

## 2014-11-26 DIAGNOSIS — N2581 Secondary hyperparathyroidism of renal origin: Secondary | ICD-10-CM | POA: Diagnosis not present

## 2014-11-26 NOTE — Telephone Encounter (Signed)
-----   Message from Dudley Major, DO sent at 11/26/2014  6:46 AM EST ----- Cholesterol looks fine ----- Message -----    From: Lab in [facility] [place] [facility] Interface    Sent: 11/25/2014   1:35 PM      To: Dudley Major, DO

## 2014-11-26 NOTE — Telephone Encounter (Signed)
Patient is aware of normal labs  

## 2014-11-29 DIAGNOSIS — N2581 Secondary hyperparathyroidism of renal origin: Secondary | ICD-10-CM | POA: Diagnosis not present

## 2014-11-29 DIAGNOSIS — N186 End stage renal disease: Secondary | ICD-10-CM | POA: Diagnosis not present

## 2014-11-29 DIAGNOSIS — E119 Type 2 diabetes mellitus without complications: Secondary | ICD-10-CM | POA: Diagnosis not present

## 2014-11-29 DIAGNOSIS — D631 Anemia in chronic kidney disease: Secondary | ICD-10-CM | POA: Diagnosis not present

## 2014-11-29 DIAGNOSIS — D509 Iron deficiency anemia, unspecified: Secondary | ICD-10-CM | POA: Diagnosis not present

## 2014-12-01 DIAGNOSIS — D631 Anemia in chronic kidney disease: Secondary | ICD-10-CM | POA: Diagnosis not present

## 2014-12-01 DIAGNOSIS — N186 End stage renal disease: Secondary | ICD-10-CM | POA: Diagnosis not present

## 2014-12-01 DIAGNOSIS — N2581 Secondary hyperparathyroidism of renal origin: Secondary | ICD-10-CM | POA: Diagnosis not present

## 2014-12-01 DIAGNOSIS — D509 Iron deficiency anemia, unspecified: Secondary | ICD-10-CM | POA: Diagnosis not present

## 2014-12-01 DIAGNOSIS — E119 Type 2 diabetes mellitus without complications: Secondary | ICD-10-CM | POA: Diagnosis not present

## 2014-12-03 DIAGNOSIS — D631 Anemia in chronic kidney disease: Secondary | ICD-10-CM | POA: Diagnosis not present

## 2014-12-03 DIAGNOSIS — E119 Type 2 diabetes mellitus without complications: Secondary | ICD-10-CM | POA: Diagnosis not present

## 2014-12-03 DIAGNOSIS — D509 Iron deficiency anemia, unspecified: Secondary | ICD-10-CM | POA: Diagnosis not present

## 2014-12-03 DIAGNOSIS — N186 End stage renal disease: Secondary | ICD-10-CM | POA: Diagnosis not present

## 2014-12-03 DIAGNOSIS — N2581 Secondary hyperparathyroidism of renal origin: Secondary | ICD-10-CM | POA: Diagnosis not present

## 2014-12-05 ENCOUNTER — Encounter (HOSPITAL_COMMUNITY): Payer: Self-pay | Admitting: Emergency Medicine

## 2014-12-05 ENCOUNTER — Emergency Department (HOSPITAL_COMMUNITY)
Admission: EM | Admit: 2014-12-05 | Discharge: 2014-12-05 | Disposition: A | Discharge status: Home / Self Care | Payer: Medicare Other | Attending: Emergency Medicine | Admitting: Emergency Medicine

## 2014-12-05 ENCOUNTER — Emergency Department (HOSPITAL_COMMUNITY): Payer: Medicare Other

## 2014-12-05 DIAGNOSIS — Z794 Long term (current) use of insulin: Secondary | ICD-10-CM | POA: Diagnosis not present

## 2014-12-05 DIAGNOSIS — Z8673 Personal history of transient ischemic attack (TIA), and cerebral infarction without residual deficits: Secondary | ICD-10-CM | POA: Insufficient documentation

## 2014-12-05 DIAGNOSIS — R109 Unspecified abdominal pain: Secondary | ICD-10-CM | POA: Diagnosis present

## 2014-12-05 DIAGNOSIS — Z8669 Personal history of other diseases of the nervous system and sense organs: Secondary | ICD-10-CM | POA: Insufficient documentation

## 2014-12-05 DIAGNOSIS — Z87891 Personal history of nicotine dependence: Secondary | ICD-10-CM | POA: Insufficient documentation

## 2014-12-05 DIAGNOSIS — R1030 Lower abdominal pain, unspecified: Secondary | ICD-10-CM | POA: Insufficient documentation

## 2014-12-05 DIAGNOSIS — Z8739 Personal history of other diseases of the musculoskeletal system and connective tissue: Secondary | ICD-10-CM | POA: Insufficient documentation

## 2014-12-05 DIAGNOSIS — N186 End stage renal disease: Secondary | ICD-10-CM | POA: Insufficient documentation

## 2014-12-05 DIAGNOSIS — Z8619 Personal history of other infectious and parasitic diseases: Secondary | ICD-10-CM | POA: Diagnosis not present

## 2014-12-05 DIAGNOSIS — I120 Hypertensive chronic kidney disease with stage 5 chronic kidney disease or end stage renal disease: Secondary | ICD-10-CM | POA: Diagnosis not present

## 2014-12-05 DIAGNOSIS — Z992 Dependence on renal dialysis: Secondary | ICD-10-CM | POA: Diagnosis not present

## 2014-12-05 DIAGNOSIS — Z79899 Other long term (current) drug therapy: Secondary | ICD-10-CM | POA: Insufficient documentation

## 2014-12-05 DIAGNOSIS — Z7982 Long term (current) use of aspirin: Secondary | ICD-10-CM | POA: Diagnosis not present

## 2014-12-05 DIAGNOSIS — I10 Essential (primary) hypertension: Secondary | ICD-10-CM | POA: Diagnosis not present

## 2014-12-05 DIAGNOSIS — E119 Type 2 diabetes mellitus without complications: Secondary | ICD-10-CM | POA: Insufficient documentation

## 2014-12-05 DIAGNOSIS — Z9889 Other specified postprocedural states: Secondary | ICD-10-CM | POA: Diagnosis not present

## 2014-12-05 DIAGNOSIS — I2510 Atherosclerotic heart disease of native coronary artery without angina pectoris: Secondary | ICD-10-CM | POA: Diagnosis not present

## 2014-12-05 DIAGNOSIS — I509 Heart failure, unspecified: Secondary | ICD-10-CM | POA: Insufficient documentation

## 2014-12-05 DIAGNOSIS — E785 Hyperlipidemia, unspecified: Secondary | ICD-10-CM | POA: Diagnosis not present

## 2014-12-05 DIAGNOSIS — E669 Obesity, unspecified: Secondary | ICD-10-CM | POA: Insufficient documentation

## 2014-12-05 DIAGNOSIS — J90 Pleural effusion, not elsewhere classified: Secondary | ICD-10-CM | POA: Diagnosis not present

## 2014-12-05 DIAGNOSIS — J441 Chronic obstructive pulmonary disease with (acute) exacerbation: Secondary | ICD-10-CM | POA: Diagnosis not present

## 2014-12-05 DIAGNOSIS — Z87438 Personal history of other diseases of male genital organs: Secondary | ICD-10-CM | POA: Insufficient documentation

## 2014-12-05 LAB — COMPREHENSIVE METABOLIC PANEL
ALK PHOS: 52 U/L (ref 39–117)
ALT: 16 U/L (ref 0–53)
AST: 20 U/L (ref 0–37)
Albumin: 3.8 g/dL (ref 3.5–5.2)
Anion gap: 13 (ref 5–15)
BUN: 50 mg/dL — ABNORMAL HIGH (ref 6–23)
CALCIUM: 8.6 mg/dL (ref 8.4–10.5)
CO2: 31 mmol/L (ref 19–32)
CREATININE: 10.8 mg/dL — AB (ref 0.50–1.35)
Chloride: 98 mmol/L (ref 96–112)
GFR calc non Af Amer: 4 mL/min — ABNORMAL LOW (ref >90)
GFR, EST AFRICAN AMERICAN: 5 mL/min — AB (ref >90)
Glucose, Bld: 48 mg/dL — ABNORMAL LOW (ref 70–99)
Potassium: 5 mmol/L (ref 3.5–5.1)
Sodium: 142 mmol/L (ref 135–145)
Total Bilirubin: 0.5 mg/dL (ref 0.3–1.2)
Total Protein: 6.9 g/dL (ref 6.0–8.3)

## 2014-12-05 LAB — CBC WITH DIFFERENTIAL/PLATELET
Basophils Absolute: 0 10*3/uL (ref 0.0–0.1)
Basophils Relative: 1 % (ref 0–1)
Eosinophils Absolute: 0.2 10*3/uL (ref 0.0–0.7)
Eosinophils Relative: 3 % (ref 0–5)
HCT: 34.5 % — ABNORMAL LOW (ref 39.0–52.0)
Hemoglobin: 11.5 g/dL — ABNORMAL LOW (ref 13.0–17.0)
Lymphocytes Relative: 24 % (ref 12–46)
Lymphs Abs: 1.8 10*3/uL (ref 0.7–4.0)
MCH: 30 pg (ref 26.0–34.0)
MCHC: 33.3 g/dL (ref 30.0–36.0)
MCV: 90.1 fL (ref 78.0–100.0)
MONO ABS: 0.8 10*3/uL (ref 0.1–1.0)
MONOS PCT: 10 % (ref 3–12)
NEUTROS PCT: 62 % (ref 43–77)
Neutro Abs: 4.7 10*3/uL (ref 1.7–7.7)
Platelets: 209 10*3/uL (ref 150–400)
RBC: 3.83 MIL/uL — ABNORMAL LOW (ref 4.22–5.81)
RDW: 14.2 % (ref 11.5–15.5)
WBC: 7.6 10*3/uL (ref 4.0–10.5)

## 2014-12-05 LAB — LIPASE, BLOOD: Lipase: 43 U/L (ref 11–59)

## 2014-12-05 LAB — CBG MONITORING, ED
Glucose-Capillary: 50 mg/dL — ABNORMAL LOW (ref 70–99)
Glucose-Capillary: 101 mg/dL — ABNORMAL HIGH (ref 70–99)

## 2014-12-05 MED ORDER — DEXTROSE 50 % IV SOLN: 50.0000 mL | Freq: Once | INTRAVENOUS | Status: DC

## 2014-12-05 NOTE — ED Notes (Signed)
Pt given 8 oz OJ in response to glucose of 48. EDP aware.

## 2014-12-05 NOTE — Discharge Instructions (Signed)

## 2014-12-05 NOTE — ED Notes (Signed)
Patient transported to X-ray 

## 2014-12-05 NOTE — ED Notes (Signed)
Patient is a dialysis patient. Dialysis on Mon Wed and Fr, last dialyzed on Friday. Patient states "I took on too much water" and is having lower abdominal pain that started this evening. Denies n/v/d, SOB or CP.

## 2014-12-05 NOTE — ED Notes (Signed)
Pt reports that he is unable to make urine.

## 2014-12-05 NOTE — ED Provider Notes (Signed)
CSN: [id number]     Arrival date & time 12/05/14  1956 History   First MD Initiated Contact with Patient 12/05/14 2033     Chief Complaint  Patient presents with  . Abdominal Pain     (Consider location/radiation/quality/duration/timing/severity/associated sxs/prior Treatment) Patient is a 69 y.o. male presenting with abdominal pain.  Abdominal Pain Pain location:  Generalized Pain quality: aching   Pain radiates to:  Does not radiate Pain severity:  Moderate Onset quality:  Gradual Duration:  1 day Timing:  Constant Progression:  Unchanged Chronicity:  Recurrent Context comment:  Esrd, dialyzes mwf, drank too much fluid Relieved by:  Nothing Worsened by:  Movement and palpation Associated symptoms: shortness of breath   Associated symptoms: no anorexia, no cough, no fever, no nausea and no vomiting     Past Medical History  Diagnosis Date  . Hyperlipidemia   . Retinopathy   . Gout   . ESRD (end stage renal disease)     Started HD in [place] in 2009, ESRD was due to DM. Moved to [facility] in Dec 2009 and now gets dialysis at [facility] on a MWF schedule.     . Type II or unspecified type diabetes mellitus without mention of complication, not stated as uncontrolled     adult onset  . Erectile dysfunction     penile implant  . Abnormal stress test     s/p cath November 2013 with modest disease involving the ostial left main, proximal LAD, proximal RCA - do not appear to be hemodynamically signficant; mild LV dysfunction  . Obesity   . Hypertension   . Hepatitis C   . COPD (chronic obstructive pulmonary disease)   . Coronary artery disease     per cath report 2013  . Ejection fraction < 50%   . Chronic systolic CHF (congestive heart failure)   . Bacteremia   . Shortness of breath     Hx: of with exertion  . Gout     Hx: of  . Stroke    Past Surgical History  Procedure Laterality Date  . Penile prosthesis implant    . Fistula      RUE and wrist  . Cardiac  catheterization  August 26, 2012  . Eye surgery      laser.  and surgery for DM  . Insertion of dialysis catheter Right 01/01/2013    Procedure: INSERTION OF DIALYSIS CATHETER right  internal jugular;  Surgeon: Serafina Mitchell, MD;  Location: [place];  Service: Vascular;  Laterality: Right;  . Av fistula placement Left 01/13/2013    Procedure: INSERTION OF ARTERIOVENOUS (AV) GORE-TEX GRAFT ARM;  Surgeon: Angelia Mould, MD;  Location: [place];  Service: Vascular;  Laterality: Left;  . Thrombectomy w/ embolectomy Left 03/26/2013    Procedure: Attempted thrombectomy of left arm arteriovenous goretex graft.;  Surgeon: Angelia Mould, MD;  Location: [place];  Service: Vascular;  Laterality: Left;  . Insertion of dialysis catheter N/A 03/26/2013    Procedure: INSERTION OF DIALYSIS CATHETER Left internal jugular vein;  Surgeon: Angelia Mould, MD;  Location: [place];  Service: Vascular;  Laterality: N/A;  . Avgg removal Left 03/26/2013    Procedure: REMOVAL OF  NON INCORPORATED ARTERIOVENOUS GORETEX GRAFT ([place]) left arm * repair of  left brachial artery with vein patch angioplasty.;  Surgeon: Angelia Mould, MD;  Location: [place];  Service: Vascular;  Laterality: Left;  . Av fistula placement Left 05/05/2013  Procedure: INSERTION OF LEFT UPPER ARM  ARTERIOVENOUS GORTEX GRAFT;  Surgeon: Angelia Mould, MD;  Location: [place];  Service: Vascular;  Laterality: Left;  . Tee without cardioversion N/A 06/11/2013    Procedure: TRANSESOPHAGEAL ECHOCARDIOGRAM (TEE);  Surgeon: Larey Dresser, MD;  Location: [place];  Service: Cardiovascular;  Laterality: N/A;  . Embolectomy Right 08/27/2013    Procedure: EMBOLECTOMY;  Surgeon: Mal Misty, MD;  Location: [place];  Service: Vascular;  Laterality: Right;  Thrombectomy of Radial and ulnar artery.  . Cataract extraction w/ intraocular lens  implant, bilateral    . Colonoscopy    . Ligation of arteriovenous  fistula Right 12/30/2013     Procedure: REMOVAL OF SEGMENT OF GORTEX GRAFT AND FISTULA  AND REPAIR OF BRACHIAL ARTERY;  Surgeon: Mal Misty, MD;  Location: [place];  Service: Vascular;  Laterality: Right;  Annell Greening N/A 04/07/2013    Procedure: VENOGRAM;  Surgeon: Serafina Mitchell, MD;  Location: [facility] CATH LAB;  Service: Cardiovascular;  Laterality: N/A;   Family History  Problem Relation Age of Onset  . Heart disease Father   . Diabetes Sister   . Diabetes Brother   . Diabetes Son    History  Substance Use Topics  . Smoking status: Former Smoker -- 7 years    Quit date: 06/10/1973  . Smokeless tobacco: Never Used  . Alcohol Use: No    Review of Systems  Constitutional: Negative for fever.  Respiratory: Positive for shortness of breath. Negative for cough.   Gastrointestinal: Positive for abdominal pain. Negative for nausea, vomiting and anorexia.  All other systems reviewed and are negative.     Allergies  Review of patient's allergies indicates no known allergies.  Home Medications   Prior to Admission medications   Medication Sig Start Date End Date Taking? Authorizing Provider  amitriptyline (ELAVIL) 25 MG tablet TAKE ONE TABLET BY MOUTH AT BEDTIME FOR  NEUROPATHY 03/18/14  Yes Bronson Ing, DPM  aspirin EC 81 MG tablet Take 81 mg by mouth daily. 06/18/13  Yes Carlena Bjornstad, MD  Calcium Acetate 667 MG TABS Take 1,334 tablets by mouth 3 (three) times daily with meals.    Yes Historical Provider, MD  cinacalcet (SENSIPAR) 60 MG tablet Take 60 mg by mouth 2 (two) times daily.    Yes Historical Provider, MD  clopidogrel (PLAVIX) 75 MG tablet Take 75 mg by mouth daily.    Yes Historical Provider, MD  HYDROcodone-acetaminophen (NORCO) 5-325 MG per tablet Take 1 tablet by mouth every 6 (six) hours as needed for moderate pain. 10/02/14  Yes Frazier Richards, MD  insulin NPH (HUMULIN N,NOVOLIN N) 100 UNIT/ML injection Inject 20 Units into the skin 2 (two) times daily.    Yes Historical Provider, MD  insulin  regular (NOVOLIN R,HUMULIN R) 100 units/mL injection Inject 20-30 Units into the skin 3 (three) times daily before meals.    Yes Historical Provider, MD  lidocaine-prilocaine (EMLA) cream Apply 1 application topically as needed (dialysis).   Yes Historical Provider, MD  multivitamin (RENA-VIT) TABS tablet Take 1 tablet by mouth daily.    Yes Historical Provider, MD  sevelamer (RENVELA) 800 MG tablet Take 800-3,200 mg by mouth 5 (five) times daily. 4 tablets three times daily with meals and 1 tablet twice daily with snacks.   Yes Historical Provider, MD  simvastatin (ZOCOR) 20 MG tablet Take 20 mg by mouth daily at 6 PM.    Yes Historical Provider, MD  BP 138/66 mmHg  Pulse 60  Temp(Src) 98.1 F (36.7 C) (Oral)  Resp 18  Ht 5\' 9"  (1.753 m)  Wt 245 lb (111.131 kg)  BMI 36.16 kg/m2  SpO2 95% Physical Exam  Constitutional: He is oriented to person, place, and time. He appears well-developed and well-nourished.  HENT:  Head: Normocephalic and atraumatic.  Eyes: Conjunctivae and EOM are normal.  Neck: Normal range of motion. Neck supple.  Cardiovascular: Normal rate, regular rhythm and normal heart sounds.   Pulmonary/Chest: Effort normal and breath sounds normal. No respiratory distress.  Abdominal: He exhibits no distension. There is no tenderness. There is no rebound and no guarding.  Musculoskeletal: Normal range of motion.       Right lower leg: He exhibits no edema.       Left lower leg: He exhibits no edema.  Neurological: He is alert and oriented to person, place, and time.  Skin: Skin is warm and dry.  Vitals reviewed.   ED Course  Procedures (including critical care time) Labs Review Labs Reviewed  CBC WITH DIFFERENTIAL/PLATELET - Abnormal; Notable for the following:    RBC 3.83 (*)    Hemoglobin 11.5 (*)    HCT 34.5 (*)    All other components within normal limits  COMPREHENSIVE METABOLIC PANEL - Abnormal; Notable for the following:    Glucose, Bld 48 (*)    BUN 50  (*)    Creatinine, Ser 10.80 (*)    GFR calc non Af Amer 4 (*)    GFR calc Af Amer 5 (*)    All other components within normal limits  CBG MONITORING, ED - Abnormal; Notable for the following:    Glucose-Capillary 50 (*)    All other components within normal limits  CBG MONITORING, ED - Abnormal; Notable for the following:    Glucose-Capillary 101 (*)    All other components within normal limits  LIPASE, BLOOD    Imaging Review Dg Chest 2 View  12/05/2014   CLINICAL DATA:  Abdominal pain.  EXAM: CHEST  2 VIEW  COMPARISON:  12/30/2013, multiple priors  FINDINGS: Mild cardiomegaly is unchanged. There is a small left pleural effusion. Mild pulmonary vascular congestion. No consolidation or pneumothorax. Right subclavian vascular stent is seen. Calcification in the left thoracic inlet is unchanged from multiple prior exams. No acute osseous abnormalities are seen.  IMPRESSION: Mild vascular congestion and small left pleural effusion.   Electronically Signed   By: Jeb Levering M.D.   On: 12/05/2014 21:06     EKG Interpretation   Date/Time:  Sunday December 05 2014 20:44:52 EST Ventricular Rate:  63 PR Interval:  174 QRS Duration: 101 QT Interval:  461 QTC Calculation: 472 R Axis:   4 Text Interpretation:  Sinus rhythm Ventricular premature complex Abnormal  T, consider ischemia, diffuse leads improved nonspecific st changes since  last tracing Confirmed by Debby Freiberg [phone]) on 12/05/2014 8:49:20 PM      MDM   Final diagnoses:  Lower abdominal pain    69 y.o. male with pertinent PMH of ESRD (dialyzes MWF, last dialysis Fri) presents with recurrent abd pain which he states is identical to prior episodes of drinking too much water.  On arrival vital signs and physical exam as above. No abdominal tenderness on my exam. No signs of fluid overload on exam.   Workup unremarkable for patient baseline with exception of hypoglycemia. The patient was given a glass of orange juice  which she drank and had improvement  of glucose. Discharged home in stable condition to follow-up with dialysis tomorrow morning.  I have reviewed all laboratory and imaging studies if ordered as above  1. Lower abdominal pain         Debby Freiberg, MD 12/05/14 2256

## 2014-12-06 DIAGNOSIS — E119 Type 2 diabetes mellitus without complications: Secondary | ICD-10-CM | POA: Diagnosis not present

## 2014-12-06 DIAGNOSIS — N186 End stage renal disease: Secondary | ICD-10-CM | POA: Diagnosis not present

## 2014-12-06 DIAGNOSIS — N2581 Secondary hyperparathyroidism of renal origin: Secondary | ICD-10-CM | POA: Diagnosis not present

## 2014-12-06 DIAGNOSIS — D631 Anemia in chronic kidney disease: Secondary | ICD-10-CM | POA: Diagnosis not present

## 2014-12-06 DIAGNOSIS — D509 Iron deficiency anemia, unspecified: Secondary | ICD-10-CM | POA: Diagnosis not present

## 2014-12-08 DIAGNOSIS — D631 Anemia in chronic kidney disease: Secondary | ICD-10-CM | POA: Diagnosis not present

## 2014-12-08 DIAGNOSIS — D509 Iron deficiency anemia, unspecified: Secondary | ICD-10-CM | POA: Diagnosis not present

## 2014-12-08 DIAGNOSIS — N186 End stage renal disease: Secondary | ICD-10-CM | POA: Diagnosis not present

## 2014-12-08 DIAGNOSIS — N2581 Secondary hyperparathyroidism of renal origin: Secondary | ICD-10-CM | POA: Diagnosis not present

## 2014-12-08 DIAGNOSIS — E119 Type 2 diabetes mellitus without complications: Secondary | ICD-10-CM | POA: Diagnosis not present

## 2014-12-10 DIAGNOSIS — D631 Anemia in chronic kidney disease: Secondary | ICD-10-CM | POA: Diagnosis not present

## 2014-12-10 DIAGNOSIS — N186 End stage renal disease: Secondary | ICD-10-CM | POA: Diagnosis not present

## 2014-12-10 DIAGNOSIS — E119 Type 2 diabetes mellitus without complications: Secondary | ICD-10-CM | POA: Diagnosis not present

## 2014-12-10 DIAGNOSIS — N2581 Secondary hyperparathyroidism of renal origin: Secondary | ICD-10-CM | POA: Diagnosis not present

## 2014-12-10 DIAGNOSIS — D509 Iron deficiency anemia, unspecified: Secondary | ICD-10-CM | POA: Diagnosis not present

## 2014-12-13 DIAGNOSIS — N186 End stage renal disease: Secondary | ICD-10-CM | POA: Diagnosis not present

## 2014-12-13 DIAGNOSIS — D631 Anemia in chronic kidney disease: Secondary | ICD-10-CM | POA: Diagnosis not present

## 2014-12-13 DIAGNOSIS — E119 Type 2 diabetes mellitus without complications: Secondary | ICD-10-CM | POA: Diagnosis not present

## 2014-12-13 DIAGNOSIS — N2581 Secondary hyperparathyroidism of renal origin: Secondary | ICD-10-CM | POA: Diagnosis not present

## 2014-12-13 DIAGNOSIS — D509 Iron deficiency anemia, unspecified: Secondary | ICD-10-CM | POA: Diagnosis not present

## 2014-12-13 DIAGNOSIS — Z992 Dependence on renal dialysis: Secondary | ICD-10-CM | POA: Diagnosis not present

## 2014-12-15 DIAGNOSIS — D631 Anemia in chronic kidney disease: Secondary | ICD-10-CM | POA: Diagnosis not present

## 2014-12-15 DIAGNOSIS — N2581 Secondary hyperparathyroidism of renal origin: Secondary | ICD-10-CM | POA: Diagnosis not present

## 2014-12-15 DIAGNOSIS — D509 Iron deficiency anemia, unspecified: Secondary | ICD-10-CM | POA: Diagnosis not present

## 2014-12-15 DIAGNOSIS — E119 Type 2 diabetes mellitus without complications: Secondary | ICD-10-CM | POA: Diagnosis not present

## 2014-12-15 DIAGNOSIS — N186 End stage renal disease: Secondary | ICD-10-CM | POA: Diagnosis not present

## 2014-12-17 DIAGNOSIS — N2581 Secondary hyperparathyroidism of renal origin: Secondary | ICD-10-CM | POA: Diagnosis not present

## 2014-12-17 DIAGNOSIS — N186 End stage renal disease: Secondary | ICD-10-CM | POA: Diagnosis not present

## 2014-12-17 DIAGNOSIS — D509 Iron deficiency anemia, unspecified: Secondary | ICD-10-CM | POA: Diagnosis not present

## 2014-12-17 DIAGNOSIS — D631 Anemia in chronic kidney disease: Secondary | ICD-10-CM | POA: Diagnosis not present

## 2014-12-17 DIAGNOSIS — E119 Type 2 diabetes mellitus without complications: Secondary | ICD-10-CM | POA: Diagnosis not present

## 2014-12-20 DIAGNOSIS — E119 Type 2 diabetes mellitus without complications: Secondary | ICD-10-CM | POA: Diagnosis not present

## 2014-12-20 DIAGNOSIS — N186 End stage renal disease: Secondary | ICD-10-CM | POA: Diagnosis not present

## 2014-12-20 DIAGNOSIS — N2581 Secondary hyperparathyroidism of renal origin: Secondary | ICD-10-CM | POA: Diagnosis not present

## 2014-12-20 DIAGNOSIS — D509 Iron deficiency anemia, unspecified: Secondary | ICD-10-CM | POA: Diagnosis not present

## 2014-12-20 DIAGNOSIS — D631 Anemia in chronic kidney disease: Secondary | ICD-10-CM | POA: Diagnosis not present

## 2014-12-22 DIAGNOSIS — D631 Anemia in chronic kidney disease: Secondary | ICD-10-CM | POA: Diagnosis not present

## 2014-12-22 DIAGNOSIS — E119 Type 2 diabetes mellitus without complications: Secondary | ICD-10-CM | POA: Diagnosis not present

## 2014-12-22 DIAGNOSIS — D509 Iron deficiency anemia, unspecified: Secondary | ICD-10-CM | POA: Diagnosis not present

## 2014-12-22 DIAGNOSIS — N186 End stage renal disease: Secondary | ICD-10-CM | POA: Diagnosis not present

## 2014-12-22 DIAGNOSIS — N2581 Secondary hyperparathyroidism of renal origin: Secondary | ICD-10-CM | POA: Diagnosis not present

## 2014-12-24 DIAGNOSIS — D631 Anemia in chronic kidney disease: Secondary | ICD-10-CM | POA: Diagnosis not present

## 2014-12-24 DIAGNOSIS — E119 Type 2 diabetes mellitus without complications: Secondary | ICD-10-CM | POA: Diagnosis not present

## 2014-12-24 DIAGNOSIS — N2581 Secondary hyperparathyroidism of renal origin: Secondary | ICD-10-CM | POA: Diagnosis not present

## 2014-12-24 DIAGNOSIS — D509 Iron deficiency anemia, unspecified: Secondary | ICD-10-CM | POA: Diagnosis not present

## 2014-12-24 DIAGNOSIS — N186 End stage renal disease: Secondary | ICD-10-CM | POA: Diagnosis not present

## 2014-12-27 DIAGNOSIS — D509 Iron deficiency anemia, unspecified: Secondary | ICD-10-CM | POA: Diagnosis not present

## 2014-12-27 DIAGNOSIS — D631 Anemia in chronic kidney disease: Secondary | ICD-10-CM | POA: Diagnosis not present

## 2014-12-27 DIAGNOSIS — E119 Type 2 diabetes mellitus without complications: Secondary | ICD-10-CM | POA: Diagnosis not present

## 2014-12-27 DIAGNOSIS — N186 End stage renal disease: Secondary | ICD-10-CM | POA: Diagnosis not present

## 2014-12-27 DIAGNOSIS — N2581 Secondary hyperparathyroidism of renal origin: Secondary | ICD-10-CM | POA: Diagnosis not present

## 2014-12-29 DIAGNOSIS — D509 Iron deficiency anemia, unspecified: Secondary | ICD-10-CM | POA: Diagnosis not present

## 2014-12-29 DIAGNOSIS — N186 End stage renal disease: Secondary | ICD-10-CM | POA: Diagnosis not present

## 2014-12-29 DIAGNOSIS — E119 Type 2 diabetes mellitus without complications: Secondary | ICD-10-CM | POA: Diagnosis not present

## 2014-12-29 DIAGNOSIS — N2581 Secondary hyperparathyroidism of renal origin: Secondary | ICD-10-CM | POA: Diagnosis not present

## 2014-12-29 DIAGNOSIS — D631 Anemia in chronic kidney disease: Secondary | ICD-10-CM | POA: Diagnosis not present

## 2014-12-31 DIAGNOSIS — E119 Type 2 diabetes mellitus without complications: Secondary | ICD-10-CM | POA: Diagnosis not present

## 2014-12-31 DIAGNOSIS — D509 Iron deficiency anemia, unspecified: Secondary | ICD-10-CM | POA: Diagnosis not present

## 2014-12-31 DIAGNOSIS — N186 End stage renal disease: Secondary | ICD-10-CM | POA: Diagnosis not present

## 2014-12-31 DIAGNOSIS — D631 Anemia in chronic kidney disease: Secondary | ICD-10-CM | POA: Diagnosis not present

## 2014-12-31 DIAGNOSIS — N2581 Secondary hyperparathyroidism of renal origin: Secondary | ICD-10-CM | POA: Diagnosis not present

## 2015-01-03 DIAGNOSIS — N2581 Secondary hyperparathyroidism of renal origin: Secondary | ICD-10-CM | POA: Diagnosis not present

## 2015-01-03 DIAGNOSIS — E119 Type 2 diabetes mellitus without complications: Secondary | ICD-10-CM | POA: Diagnosis not present

## 2015-01-03 DIAGNOSIS — D631 Anemia in chronic kidney disease: Secondary | ICD-10-CM | POA: Diagnosis not present

## 2015-01-03 DIAGNOSIS — N186 End stage renal disease: Secondary | ICD-10-CM | POA: Diagnosis not present

## 2015-01-03 DIAGNOSIS — D509 Iron deficiency anemia, unspecified: Secondary | ICD-10-CM | POA: Diagnosis not present

## 2015-01-05 DIAGNOSIS — D509 Iron deficiency anemia, unspecified: Secondary | ICD-10-CM | POA: Diagnosis not present

## 2015-01-05 DIAGNOSIS — E119 Type 2 diabetes mellitus without complications: Secondary | ICD-10-CM | POA: Diagnosis not present

## 2015-01-05 DIAGNOSIS — N2581 Secondary hyperparathyroidism of renal origin: Secondary | ICD-10-CM | POA: Diagnosis not present

## 2015-01-05 DIAGNOSIS — N186 End stage renal disease: Secondary | ICD-10-CM | POA: Diagnosis not present

## 2015-01-05 DIAGNOSIS — D631 Anemia in chronic kidney disease: Secondary | ICD-10-CM | POA: Diagnosis not present

## 2015-01-07 DIAGNOSIS — N2581 Secondary hyperparathyroidism of renal origin: Secondary | ICD-10-CM | POA: Diagnosis not present

## 2015-01-07 DIAGNOSIS — D509 Iron deficiency anemia, unspecified: Secondary | ICD-10-CM | POA: Diagnosis not present

## 2015-01-07 DIAGNOSIS — N186 End stage renal disease: Secondary | ICD-10-CM | POA: Diagnosis not present

## 2015-01-07 DIAGNOSIS — E119 Type 2 diabetes mellitus without complications: Secondary | ICD-10-CM | POA: Diagnosis not present

## 2015-01-07 DIAGNOSIS — D631 Anemia in chronic kidney disease: Secondary | ICD-10-CM | POA: Diagnosis not present

## 2015-01-10 DIAGNOSIS — D509 Iron deficiency anemia, unspecified: Secondary | ICD-10-CM | POA: Diagnosis not present

## 2015-01-10 DIAGNOSIS — E119 Type 2 diabetes mellitus without complications: Secondary | ICD-10-CM | POA: Diagnosis not present

## 2015-01-10 DIAGNOSIS — N2581 Secondary hyperparathyroidism of renal origin: Secondary | ICD-10-CM | POA: Diagnosis not present

## 2015-01-10 DIAGNOSIS — N186 End stage renal disease: Secondary | ICD-10-CM | POA: Diagnosis not present

## 2015-01-10 DIAGNOSIS — D631 Anemia in chronic kidney disease: Secondary | ICD-10-CM | POA: Diagnosis not present

## 2015-01-12 DIAGNOSIS — N2581 Secondary hyperparathyroidism of renal origin: Secondary | ICD-10-CM | POA: Diagnosis not present

## 2015-01-12 DIAGNOSIS — D509 Iron deficiency anemia, unspecified: Secondary | ICD-10-CM | POA: Diagnosis not present

## 2015-01-12 DIAGNOSIS — D631 Anemia in chronic kidney disease: Secondary | ICD-10-CM | POA: Diagnosis not present

## 2015-01-12 DIAGNOSIS — E119 Type 2 diabetes mellitus without complications: Secondary | ICD-10-CM | POA: Diagnosis not present

## 2015-01-12 DIAGNOSIS — N186 End stage renal disease: Secondary | ICD-10-CM | POA: Diagnosis not present

## 2015-01-13 DIAGNOSIS — Z992 Dependence on renal dialysis: Secondary | ICD-10-CM | POA: Diagnosis not present

## 2015-01-13 DIAGNOSIS — N186 End stage renal disease: Secondary | ICD-10-CM | POA: Diagnosis not present

## 2015-01-13 DIAGNOSIS — E1129 Type 2 diabetes mellitus with other diabetic kidney complication: Secondary | ICD-10-CM | POA: Diagnosis not present

## 2015-01-14 DIAGNOSIS — D509 Iron deficiency anemia, unspecified: Secondary | ICD-10-CM | POA: Diagnosis not present

## 2015-01-14 DIAGNOSIS — E119 Type 2 diabetes mellitus without complications: Secondary | ICD-10-CM | POA: Diagnosis not present

## 2015-01-14 DIAGNOSIS — N2581 Secondary hyperparathyroidism of renal origin: Secondary | ICD-10-CM | POA: Diagnosis not present

## 2015-01-14 DIAGNOSIS — N186 End stage renal disease: Secondary | ICD-10-CM | POA: Diagnosis not present

## 2015-01-14 DIAGNOSIS — D631 Anemia in chronic kidney disease: Secondary | ICD-10-CM | POA: Diagnosis not present

## 2015-01-17 DIAGNOSIS — N2581 Secondary hyperparathyroidism of renal origin: Secondary | ICD-10-CM | POA: Diagnosis not present

## 2015-01-17 DIAGNOSIS — D631 Anemia in chronic kidney disease: Secondary | ICD-10-CM | POA: Diagnosis not present

## 2015-01-17 DIAGNOSIS — N186 End stage renal disease: Secondary | ICD-10-CM | POA: Diagnosis not present

## 2015-01-17 DIAGNOSIS — D509 Iron deficiency anemia, unspecified: Secondary | ICD-10-CM | POA: Diagnosis not present

## 2015-01-17 DIAGNOSIS — E119 Type 2 diabetes mellitus without complications: Secondary | ICD-10-CM | POA: Diagnosis not present

## 2015-01-18 DIAGNOSIS — E11311 Type 2 diabetes mellitus with unspecified diabetic retinopathy with macular edema: Secondary | ICD-10-CM | POA: Diagnosis not present

## 2015-01-18 DIAGNOSIS — H4720 Unspecified optic atrophy: Secondary | ICD-10-CM | POA: Diagnosis not present

## 2015-01-18 DIAGNOSIS — E11359 Type 2 diabetes mellitus with proliferative diabetic retinopathy without macular edema: Secondary | ICD-10-CM | POA: Diagnosis not present

## 2015-01-18 LAB — HM DIABETES EYE EXAM

## 2015-01-19 DIAGNOSIS — E119 Type 2 diabetes mellitus without complications: Secondary | ICD-10-CM | POA: Diagnosis not present

## 2015-01-19 DIAGNOSIS — N186 End stage renal disease: Secondary | ICD-10-CM | POA: Diagnosis not present

## 2015-01-19 DIAGNOSIS — N2581 Secondary hyperparathyroidism of renal origin: Secondary | ICD-10-CM | POA: Diagnosis not present

## 2015-01-19 DIAGNOSIS — D631 Anemia in chronic kidney disease: Secondary | ICD-10-CM | POA: Diagnosis not present

## 2015-01-19 DIAGNOSIS — D509 Iron deficiency anemia, unspecified: Secondary | ICD-10-CM | POA: Diagnosis not present

## 2015-01-21 DIAGNOSIS — E119 Type 2 diabetes mellitus without complications: Secondary | ICD-10-CM | POA: Diagnosis not present

## 2015-01-21 DIAGNOSIS — D509 Iron deficiency anemia, unspecified: Secondary | ICD-10-CM | POA: Diagnosis not present

## 2015-01-21 DIAGNOSIS — N186 End stage renal disease: Secondary | ICD-10-CM | POA: Diagnosis not present

## 2015-01-21 DIAGNOSIS — D631 Anemia in chronic kidney disease: Secondary | ICD-10-CM | POA: Diagnosis not present

## 2015-01-21 DIAGNOSIS — N2581 Secondary hyperparathyroidism of renal origin: Secondary | ICD-10-CM | POA: Diagnosis not present

## 2015-01-24 DIAGNOSIS — D631 Anemia in chronic kidney disease: Secondary | ICD-10-CM | POA: Diagnosis not present

## 2015-01-24 DIAGNOSIS — N2581 Secondary hyperparathyroidism of renal origin: Secondary | ICD-10-CM | POA: Diagnosis not present

## 2015-01-24 DIAGNOSIS — E119 Type 2 diabetes mellitus without complications: Secondary | ICD-10-CM | POA: Diagnosis not present

## 2015-01-24 DIAGNOSIS — D509 Iron deficiency anemia, unspecified: Secondary | ICD-10-CM | POA: Diagnosis not present

## 2015-01-24 DIAGNOSIS — N186 End stage renal disease: Secondary | ICD-10-CM | POA: Diagnosis not present

## 2015-01-26 DIAGNOSIS — D631 Anemia in chronic kidney disease: Secondary | ICD-10-CM | POA: Diagnosis not present

## 2015-01-26 DIAGNOSIS — D509 Iron deficiency anemia, unspecified: Secondary | ICD-10-CM | POA: Diagnosis not present

## 2015-01-26 DIAGNOSIS — E119 Type 2 diabetes mellitus without complications: Secondary | ICD-10-CM | POA: Diagnosis not present

## 2015-01-26 DIAGNOSIS — N186 End stage renal disease: Secondary | ICD-10-CM | POA: Diagnosis not present

## 2015-01-26 DIAGNOSIS — N2581 Secondary hyperparathyroidism of renal origin: Secondary | ICD-10-CM | POA: Diagnosis not present

## 2015-01-28 DIAGNOSIS — N2581 Secondary hyperparathyroidism of renal origin: Secondary | ICD-10-CM | POA: Diagnosis not present

## 2015-01-28 DIAGNOSIS — D509 Iron deficiency anemia, unspecified: Secondary | ICD-10-CM | POA: Diagnosis not present

## 2015-01-28 DIAGNOSIS — N186 End stage renal disease: Secondary | ICD-10-CM | POA: Diagnosis not present

## 2015-01-28 DIAGNOSIS — D631 Anemia in chronic kidney disease: Secondary | ICD-10-CM | POA: Diagnosis not present

## 2015-01-28 DIAGNOSIS — E119 Type 2 diabetes mellitus without complications: Secondary | ICD-10-CM | POA: Diagnosis not present

## 2015-01-31 DIAGNOSIS — E119 Type 2 diabetes mellitus without complications: Secondary | ICD-10-CM | POA: Diagnosis not present

## 2015-01-31 DIAGNOSIS — N186 End stage renal disease: Secondary | ICD-10-CM | POA: Diagnosis not present

## 2015-01-31 DIAGNOSIS — D509 Iron deficiency anemia, unspecified: Secondary | ICD-10-CM | POA: Diagnosis not present

## 2015-01-31 DIAGNOSIS — N2581 Secondary hyperparathyroidism of renal origin: Secondary | ICD-10-CM | POA: Diagnosis not present

## 2015-01-31 DIAGNOSIS — D631 Anemia in chronic kidney disease: Secondary | ICD-10-CM | POA: Diagnosis not present

## 2015-02-02 DIAGNOSIS — N2581 Secondary hyperparathyroidism of renal origin: Secondary | ICD-10-CM | POA: Diagnosis not present

## 2015-02-02 DIAGNOSIS — E119 Type 2 diabetes mellitus without complications: Secondary | ICD-10-CM | POA: Diagnosis not present

## 2015-02-02 DIAGNOSIS — D509 Iron deficiency anemia, unspecified: Secondary | ICD-10-CM | POA: Diagnosis not present

## 2015-02-02 DIAGNOSIS — D631 Anemia in chronic kidney disease: Secondary | ICD-10-CM | POA: Diagnosis not present

## 2015-02-02 DIAGNOSIS — N186 End stage renal disease: Secondary | ICD-10-CM | POA: Diagnosis not present

## 2015-02-03 ENCOUNTER — Ambulatory Visit (INDEPENDENT_AMBULATORY_CARE_PROVIDER_SITE_OTHER): Payer: Medicare Other

## 2015-02-03 DIAGNOSIS — M79673 Pain in unspecified foot: Secondary | ICD-10-CM

## 2015-02-03 DIAGNOSIS — B351 Tinea unguium: Secondary | ICD-10-CM | POA: Diagnosis not present

## 2015-02-03 NOTE — Progress Notes (Signed)
Presents today chief complaint of painful elongated toenails.  Objective: Pulses are palpable bilateral nails are thick, yellow dystrophic onychomycosis and painful palpation.   Assessment: Onychomycosis with pain in limb.  Plan: Treatment of nails in thickness and length as covered service secondary to pain.  

## 2015-02-04 DIAGNOSIS — D509 Iron deficiency anemia, unspecified: Secondary | ICD-10-CM | POA: Diagnosis not present

## 2015-02-04 DIAGNOSIS — N2581 Secondary hyperparathyroidism of renal origin: Secondary | ICD-10-CM | POA: Diagnosis not present

## 2015-02-04 DIAGNOSIS — E119 Type 2 diabetes mellitus without complications: Secondary | ICD-10-CM | POA: Diagnosis not present

## 2015-02-04 DIAGNOSIS — D631 Anemia in chronic kidney disease: Secondary | ICD-10-CM | POA: Diagnosis not present

## 2015-02-04 DIAGNOSIS — N186 End stage renal disease: Secondary | ICD-10-CM | POA: Diagnosis not present

## 2015-02-07 DIAGNOSIS — D631 Anemia in chronic kidney disease: Secondary | ICD-10-CM | POA: Diagnosis not present

## 2015-02-07 DIAGNOSIS — N186 End stage renal disease: Secondary | ICD-10-CM | POA: Diagnosis not present

## 2015-02-07 DIAGNOSIS — D509 Iron deficiency anemia, unspecified: Secondary | ICD-10-CM | POA: Diagnosis not present

## 2015-02-07 DIAGNOSIS — E119 Type 2 diabetes mellitus without complications: Secondary | ICD-10-CM | POA: Diagnosis not present

## 2015-02-07 DIAGNOSIS — N2581 Secondary hyperparathyroidism of renal origin: Secondary | ICD-10-CM | POA: Diagnosis not present

## 2015-02-08 ENCOUNTER — Ambulatory Visit (INDEPENDENT_AMBULATORY_CARE_PROVIDER_SITE_OTHER): Payer: Medicare Other | Admitting: Neurology

## 2015-02-08 ENCOUNTER — Encounter: Payer: Self-pay | Admitting: Neurology

## 2015-02-08 VITALS — BP 122/82 | HR 76 | Ht 69.0 in | Wt 238.0 lb

## 2015-02-08 DIAGNOSIS — I639 Cerebral infarction, unspecified: Secondary | ICD-10-CM

## 2015-02-08 DIAGNOSIS — I679 Cerebrovascular disease, unspecified: Secondary | ICD-10-CM

## 2015-02-08 NOTE — Progress Notes (Signed)
NEUROLOGY FOLLOW UP OFFICE NOTE  FRANCESCA VOS [id number]  HISTORY OF PRESENT ILLNESS: Ernest [name] is a 68 year old right-handed man with hyperlipidemia, ESRD, type II diabetes mellitus, hypertension, COPD, gout, chronic CHF (EF 30-35%), coronary artery disease, hepatitis C and history of stroke who follows up for probable TIA or CVA of unknown mechanism.  UPDATE: He is taking Plavix and simvastatin 20mg .  Lipid panel from February sowed cholesterol 116, HDL 59 and LDL 45.  Carotid doppler showed no hemodynamically significant stenosis.  No new issues.  He is doing well.  HISTORY: Around October, he developed slurred speech.  He also experienced ringing in his right ear, with sensation of water in his right ear.  He did not seek medical attention at the time.  There was no facial droop or difficulty walking or swallowing.  He decided to go to the ED on 09/21/14 for an evaluation as the slurred speech and tinnitus persisted.  He denied weakness but ED exam revealed right arm weakness.  CT of the head was performed, which revealed chronic small vessel ischemic changes, including remote right cerebellar, left pontine and right basal ganglia lacunar infarcts.  No acute infarction was seen.  BMP revealed BUN of 50 and creatine of 9.8 with GFR of 6.   CBC was unremarkable.  He already takes Plavix.  TEE from 06/11/13 showed LVEF of 30%.  Hgb A1c from 06/10/13 was 6.6.  He did see the audiologist on 10/20/14.  Exam revealed primarily sensorineural low-frequency hearing loss bilaterally, with poor high frequency hearing loss in the right ear with possible conductive/middle ear component.   PAST MEDICAL HISTORY: Past Medical History  Diagnosis Date  . Hyperlipidemia   . Retinopathy   . Gout   . ESRD (end stage renal disease)     Started HD in [place] in 2009, ESRD was due to DM. Moved to [facility] in Dec 2009 and now gets dialysis at [facility] on a MWF schedule.     . Type II or unspecified type  diabetes mellitus without mention of complication, not stated as uncontrolled     adult onset  . Erectile dysfunction     penile implant  . Abnormal stress test     s/p cath November 2013 with modest disease involving the ostial left main, proximal LAD, proximal RCA - do not appear to be hemodynamically signficant; mild LV dysfunction  . Obesity   . Hypertension   . Hepatitis C   . COPD (chronic obstructive pulmonary disease)   . Coronary artery disease     per cath report 2013  . Ejection fraction < 50%   . Chronic systolic CHF (congestive heart failure)   . Bacteremia   . Shortness of breath     Hx: of with exertion  . Gout     Hx: of  . Stroke     MEDICATIONS: Current Outpatient Prescriptions on File Prior to Visit  Medication Sig Dispense Refill  . amitriptyline (ELAVIL) 25 MG tablet TAKE ONE TABLET BY MOUTH AT BEDTIME FOR  NEUROPATHY 90 tablet 0  . aspirin EC 81 MG tablet Take 81 mg by mouth daily.    . Calcium Acetate 667 MG TABS Take 1,334 tablets by mouth 3 (three) times daily with meals.     . cinacalcet (SENSIPAR) 60 MG tablet Take 60 mg by mouth 2 (two) times daily.     . clopidogrel (PLAVIX) 75 MG tablet Take 75 mg by mouth daily.     [name]  HYDROcodone-acetaminophen (NORCO) 5-325 MG per tablet Take 1 tablet by mouth every 6 (six) hours as needed for moderate pain. 10 tablet 0  . insulin NPH (HUMULIN N,NOVOLIN N) 100 UNIT/ML injection Inject 20 Units into the skin 2 (two) times daily.     . insulin regular (NOVOLIN R,HUMULIN R) 100 units/mL injection Inject 20-30 Units into the skin 3 (three) times daily before meals.     . lidocaine-prilocaine (EMLA) cream Apply 1 application topically as needed (dialysis).    . multivitamin (RENA-VIT) TABS tablet Take 1 tablet by mouth daily.     . sevelamer (RENVELA) 800 MG tablet Take 800-3,200 mg by mouth 5 (five) times daily. 4 tablets three times daily with meals and 1 tablet twice daily with snacks.    . simvastatin (ZOCOR) 20 MG  tablet Take 20 mg by mouth daily at 6 PM.      No current facility-administered medications on file prior to visit.    ALLERGIES: [name]  FAMILY HISTORY: Family History  Problem Relation Age of Onset  . Heart disease Father   . Diabetes Sister   . Diabetes Brother   . Diabetes Son     SOCIAL HISTORY: History   Social History  . Marital Status: Married    Spouse Name: N/A  . Number of Children: N/A  . Years of Education: N/A   Occupational History  . 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 Smoking status: Former Smoker -- 7 years    Quit date: 06/10/1973  . Smokeless tobacco: Never Used  . Alcohol Use: No  . Drug Use: No  . Sexual Activity: No   Other Topics Concern  . Not on file   Social History Narrative   Adopted mentally retarded child at home, 2 adopted children, 3 living and 2 deceased.    REVIEW OF SYSTEMS: Constitutional: No fevers, chills, or sweats, no generalized fatigue, change in appetite Eyes: No visual changes, double vision, eye pain Ear, nose and throat: No hearing loss, ear pain, nasal congestion, sore throat Cardiovascular: No chest pain, palpitations Respiratory:  No shortness of breath at rest or with exertion, wheezes GastrointestinaI: No nausea, vomiting, diarrhea, abdominal pain, fecal incontinence Genitourinary:  No dysuria, urinary retention or frequency Musculoskeletal:  No neck pain, back pain Integumentary: No rash, pruritus, skin lesions Neurological: as above Psychiatric: No depression, insomnia, anxiety Endocrine: No palpitations, fatigue, diaphoresis, mood swings, change in appetite, change in weight, increased thirst Hematologic/Lymphatic:  No anemia, purpura, petechiae. Allergic/Immunologic: no itchy/runny eyes, nasal congestion, recent allergic reactions, rashes  PHYSICAL EXAM: Filed Vitals:   02/08/15 1111  BP: 122/82  Pulse: 76   General: No acute distress Head:   Normocephalic/atraumatic Eyes:  Fundoscopic exam unremarkable without vessel changes, exudates, hemorrhages or papilledema. Neck: supple, no paraspinal tenderness, full range of motion Heart:  Regular rate and rhythm Lungs:  Clear to auscultation bilaterally Back: No paraspinal tenderness Neurological Exam: alert and oriented to person, place, and time. Attention span and concentration intact, recent and remote memory intact, fund of knowledge intact.  Speech fluent and not dysarthric, language intact.  CN II-XII intact. Fundoscopic exam unremarkable without vessel changes, exudates, hemorrhages or papilledema.  Bulk and tone normal, muscle strength 5/5 throughout.  Reduced vibration in feet.  Deep tendon reflexes 1+ throughout except absent in ankles, toes downgoing.  Finger to nose testing intact.  Gait mildly wide-based.  IMPRESSION: Cerebrovascular disease Possible TIA in October  PLAN:  Continue antiplatelet therapy Continue simvastatin (LDL at goal of less than 70) Heart-healthy diet Diabetes and blood pressure control These secondary stroke measures can be managed by his PCP.  No routine follow-up needed.  Follow up if there are any problems or concerns.  15 minutes spent with patient, over 50% spent discussing management.  Metta Clines, DO  CC: Corliss Parish

## 2015-02-08 NOTE — Patient Instructions (Signed)
1.  Continue aspirin 81mg  daily and Plavix 2.  Continue simvastatin. 3.  Try to continue working on diabetes and blood pressure control 4.  Heart-healthy diet 5.  No routine follow up needed.  Management can be done by your PCP.  Follow up with any problems.

## 2015-02-09 DIAGNOSIS — D631 Anemia in chronic kidney disease: Secondary | ICD-10-CM | POA: Diagnosis not present

## 2015-02-09 DIAGNOSIS — N186 End stage renal disease: Secondary | ICD-10-CM | POA: Diagnosis not present

## 2015-02-09 DIAGNOSIS — N2581 Secondary hyperparathyroidism of renal origin: Secondary | ICD-10-CM | POA: Diagnosis not present

## 2015-02-09 DIAGNOSIS — E119 Type 2 diabetes mellitus without complications: Secondary | ICD-10-CM | POA: Diagnosis not present

## 2015-02-09 DIAGNOSIS — E1129 Type 2 diabetes mellitus with other diabetic kidney complication: Secondary | ICD-10-CM | POA: Diagnosis not present

## 2015-02-09 DIAGNOSIS — D509 Iron deficiency anemia, unspecified: Secondary | ICD-10-CM | POA: Diagnosis not present

## 2015-02-10 DIAGNOSIS — H6521 Chronic serous otitis media, right ear: Secondary | ICD-10-CM | POA: Diagnosis not present

## 2015-02-11 DIAGNOSIS — E119 Type 2 diabetes mellitus without complications: Secondary | ICD-10-CM | POA: Diagnosis not present

## 2015-02-11 DIAGNOSIS — D509 Iron deficiency anemia, unspecified: Secondary | ICD-10-CM | POA: Diagnosis not present

## 2015-02-11 DIAGNOSIS — N2581 Secondary hyperparathyroidism of renal origin: Secondary | ICD-10-CM | POA: Diagnosis not present

## 2015-02-11 DIAGNOSIS — D631 Anemia in chronic kidney disease: Secondary | ICD-10-CM | POA: Diagnosis not present

## 2015-02-11 DIAGNOSIS — N186 End stage renal disease: Secondary | ICD-10-CM | POA: Diagnosis not present

## 2015-02-12 DIAGNOSIS — E1129 Type 2 diabetes mellitus with other diabetic kidney complication: Secondary | ICD-10-CM | POA: Diagnosis not present

## 2015-02-12 DIAGNOSIS — Z992 Dependence on renal dialysis: Secondary | ICD-10-CM | POA: Diagnosis not present

## 2015-02-12 DIAGNOSIS — N186 End stage renal disease: Secondary | ICD-10-CM | POA: Diagnosis not present

## 2015-02-14 DIAGNOSIS — N186 End stage renal disease: Secondary | ICD-10-CM | POA: Diagnosis not present

## 2015-02-14 DIAGNOSIS — E119 Type 2 diabetes mellitus without complications: Secondary | ICD-10-CM | POA: Diagnosis not present

## 2015-02-14 DIAGNOSIS — D509 Iron deficiency anemia, unspecified: Secondary | ICD-10-CM | POA: Diagnosis not present

## 2015-02-14 DIAGNOSIS — D631 Anemia in chronic kidney disease: Secondary | ICD-10-CM | POA: Diagnosis not present

## 2015-02-14 DIAGNOSIS — N2581 Secondary hyperparathyroidism of renal origin: Secondary | ICD-10-CM | POA: Diagnosis not present

## 2015-02-16 DIAGNOSIS — N186 End stage renal disease: Secondary | ICD-10-CM | POA: Diagnosis not present

## 2015-02-16 DIAGNOSIS — D509 Iron deficiency anemia, unspecified: Secondary | ICD-10-CM | POA: Diagnosis not present

## 2015-02-16 DIAGNOSIS — E119 Type 2 diabetes mellitus without complications: Secondary | ICD-10-CM | POA: Diagnosis not present

## 2015-02-16 DIAGNOSIS — N2581 Secondary hyperparathyroidism of renal origin: Secondary | ICD-10-CM | POA: Diagnosis not present

## 2015-02-16 DIAGNOSIS — D631 Anemia in chronic kidney disease: Secondary | ICD-10-CM | POA: Diagnosis not present

## 2015-02-18 DIAGNOSIS — E119 Type 2 diabetes mellitus without complications: Secondary | ICD-10-CM | POA: Diagnosis not present

## 2015-02-18 DIAGNOSIS — D509 Iron deficiency anemia, unspecified: Secondary | ICD-10-CM | POA: Diagnosis not present

## 2015-02-18 DIAGNOSIS — N186 End stage renal disease: Secondary | ICD-10-CM | POA: Diagnosis not present

## 2015-02-18 DIAGNOSIS — N2581 Secondary hyperparathyroidism of renal origin: Secondary | ICD-10-CM | POA: Diagnosis not present

## 2015-02-18 DIAGNOSIS — D631 Anemia in chronic kidney disease: Secondary | ICD-10-CM | POA: Diagnosis not present

## 2015-02-21 DIAGNOSIS — E119 Type 2 diabetes mellitus without complications: Secondary | ICD-10-CM | POA: Diagnosis not present

## 2015-02-21 DIAGNOSIS — N186 End stage renal disease: Secondary | ICD-10-CM | POA: Diagnosis not present

## 2015-02-21 DIAGNOSIS — N2581 Secondary hyperparathyroidism of renal origin: Secondary | ICD-10-CM | POA: Diagnosis not present

## 2015-02-21 DIAGNOSIS — D631 Anemia in chronic kidney disease: Secondary | ICD-10-CM | POA: Diagnosis not present

## 2015-02-21 DIAGNOSIS — D509 Iron deficiency anemia, unspecified: Secondary | ICD-10-CM | POA: Diagnosis not present

## 2015-02-23 DIAGNOSIS — N2581 Secondary hyperparathyroidism of renal origin: Secondary | ICD-10-CM | POA: Diagnosis not present

## 2015-02-23 DIAGNOSIS — E119 Type 2 diabetes mellitus without complications: Secondary | ICD-10-CM | POA: Diagnosis not present

## 2015-02-23 DIAGNOSIS — D509 Iron deficiency anemia, unspecified: Secondary | ICD-10-CM | POA: Diagnosis not present

## 2015-02-23 DIAGNOSIS — D631 Anemia in chronic kidney disease: Secondary | ICD-10-CM | POA: Diagnosis not present

## 2015-02-23 DIAGNOSIS — N186 End stage renal disease: Secondary | ICD-10-CM | POA: Diagnosis not present

## 2015-02-25 DIAGNOSIS — D631 Anemia in chronic kidney disease: Secondary | ICD-10-CM | POA: Diagnosis not present

## 2015-02-25 DIAGNOSIS — N186 End stage renal disease: Secondary | ICD-10-CM | POA: Diagnosis not present

## 2015-02-25 DIAGNOSIS — E119 Type 2 diabetes mellitus without complications: Secondary | ICD-10-CM | POA: Diagnosis not present

## 2015-02-25 DIAGNOSIS — N2581 Secondary hyperparathyroidism of renal origin: Secondary | ICD-10-CM | POA: Diagnosis not present

## 2015-02-25 DIAGNOSIS — D509 Iron deficiency anemia, unspecified: Secondary | ICD-10-CM | POA: Diagnosis not present

## 2015-02-28 DIAGNOSIS — N186 End stage renal disease: Secondary | ICD-10-CM | POA: Diagnosis not present

## 2015-02-28 DIAGNOSIS — N2581 Secondary hyperparathyroidism of renal origin: Secondary | ICD-10-CM | POA: Diagnosis not present

## 2015-02-28 DIAGNOSIS — D631 Anemia in chronic kidney disease: Secondary | ICD-10-CM | POA: Diagnosis not present

## 2015-02-28 DIAGNOSIS — D509 Iron deficiency anemia, unspecified: Secondary | ICD-10-CM | POA: Diagnosis not present

## 2015-02-28 DIAGNOSIS — E119 Type 2 diabetes mellitus without complications: Secondary | ICD-10-CM | POA: Diagnosis not present

## 2015-03-02 DIAGNOSIS — N2581 Secondary hyperparathyroidism of renal origin: Secondary | ICD-10-CM | POA: Diagnosis not present

## 2015-03-02 DIAGNOSIS — D631 Anemia in chronic kidney disease: Secondary | ICD-10-CM | POA: Diagnosis not present

## 2015-03-02 DIAGNOSIS — D509 Iron deficiency anemia, unspecified: Secondary | ICD-10-CM | POA: Diagnosis not present

## 2015-03-02 DIAGNOSIS — E119 Type 2 diabetes mellitus without complications: Secondary | ICD-10-CM | POA: Diagnosis not present

## 2015-03-02 DIAGNOSIS — N186 End stage renal disease: Secondary | ICD-10-CM | POA: Diagnosis not present

## 2015-03-04 DIAGNOSIS — E119 Type 2 diabetes mellitus without complications: Secondary | ICD-10-CM | POA: Diagnosis not present

## 2015-03-04 DIAGNOSIS — N2581 Secondary hyperparathyroidism of renal origin: Secondary | ICD-10-CM | POA: Diagnosis not present

## 2015-03-04 DIAGNOSIS — N186 End stage renal disease: Secondary | ICD-10-CM | POA: Diagnosis not present

## 2015-03-04 DIAGNOSIS — D631 Anemia in chronic kidney disease: Secondary | ICD-10-CM | POA: Diagnosis not present

## 2015-03-04 DIAGNOSIS — D509 Iron deficiency anemia, unspecified: Secondary | ICD-10-CM | POA: Diagnosis not present

## 2015-03-07 DIAGNOSIS — E119 Type 2 diabetes mellitus without complications: Secondary | ICD-10-CM | POA: Diagnosis not present

## 2015-03-07 DIAGNOSIS — D631 Anemia in chronic kidney disease: Secondary | ICD-10-CM | POA: Diagnosis not present

## 2015-03-07 DIAGNOSIS — D509 Iron deficiency anemia, unspecified: Secondary | ICD-10-CM | POA: Diagnosis not present

## 2015-03-07 DIAGNOSIS — N2581 Secondary hyperparathyroidism of renal origin: Secondary | ICD-10-CM | POA: Diagnosis not present

## 2015-03-07 DIAGNOSIS — N186 End stage renal disease: Secondary | ICD-10-CM | POA: Diagnosis not present

## 2015-03-09 DIAGNOSIS — N2581 Secondary hyperparathyroidism of renal origin: Secondary | ICD-10-CM | POA: Diagnosis not present

## 2015-03-09 DIAGNOSIS — E119 Type 2 diabetes mellitus without complications: Secondary | ICD-10-CM | POA: Diagnosis not present

## 2015-03-09 DIAGNOSIS — D509 Iron deficiency anemia, unspecified: Secondary | ICD-10-CM | POA: Diagnosis not present

## 2015-03-09 DIAGNOSIS — N186 End stage renal disease: Secondary | ICD-10-CM | POA: Diagnosis not present

## 2015-03-09 DIAGNOSIS — D631 Anemia in chronic kidney disease: Secondary | ICD-10-CM | POA: Diagnosis not present

## 2015-03-11 DIAGNOSIS — E119 Type 2 diabetes mellitus without complications: Secondary | ICD-10-CM | POA: Diagnosis not present

## 2015-03-11 DIAGNOSIS — D509 Iron deficiency anemia, unspecified: Secondary | ICD-10-CM | POA: Diagnosis not present

## 2015-03-11 DIAGNOSIS — N2581 Secondary hyperparathyroidism of renal origin: Secondary | ICD-10-CM | POA: Diagnosis not present

## 2015-03-11 DIAGNOSIS — D631 Anemia in chronic kidney disease: Secondary | ICD-10-CM | POA: Diagnosis not present

## 2015-03-11 DIAGNOSIS — N186 End stage renal disease: Secondary | ICD-10-CM | POA: Diagnosis not present

## 2015-03-14 DIAGNOSIS — E119 Type 2 diabetes mellitus without complications: Secondary | ICD-10-CM | POA: Diagnosis not present

## 2015-03-14 DIAGNOSIS — N2581 Secondary hyperparathyroidism of renal origin: Secondary | ICD-10-CM | POA: Diagnosis not present

## 2015-03-14 DIAGNOSIS — N186 End stage renal disease: Secondary | ICD-10-CM | POA: Diagnosis not present

## 2015-03-14 DIAGNOSIS — D509 Iron deficiency anemia, unspecified: Secondary | ICD-10-CM | POA: Diagnosis not present

## 2015-03-14 DIAGNOSIS — D631 Anemia in chronic kidney disease: Secondary | ICD-10-CM | POA: Diagnosis not present

## 2015-03-15 DIAGNOSIS — Z992 Dependence on renal dialysis: Secondary | ICD-10-CM | POA: Diagnosis not present

## 2015-03-15 DIAGNOSIS — N186 End stage renal disease: Secondary | ICD-10-CM | POA: Diagnosis not present

## 2015-03-15 DIAGNOSIS — E1129 Type 2 diabetes mellitus with other diabetic kidney complication: Secondary | ICD-10-CM | POA: Diagnosis not present

## 2015-03-16 DIAGNOSIS — N2581 Secondary hyperparathyroidism of renal origin: Secondary | ICD-10-CM | POA: Diagnosis not present

## 2015-03-16 DIAGNOSIS — E119 Type 2 diabetes mellitus without complications: Secondary | ICD-10-CM | POA: Diagnosis not present

## 2015-03-16 DIAGNOSIS — D509 Iron deficiency anemia, unspecified: Secondary | ICD-10-CM | POA: Diagnosis not present

## 2015-03-16 DIAGNOSIS — D631 Anemia in chronic kidney disease: Secondary | ICD-10-CM | POA: Diagnosis not present

## 2015-03-16 DIAGNOSIS — N186 End stage renal disease: Secondary | ICD-10-CM | POA: Diagnosis not present

## 2015-03-18 DIAGNOSIS — D631 Anemia in chronic kidney disease: Secondary | ICD-10-CM | POA: Diagnosis not present

## 2015-03-18 DIAGNOSIS — N2581 Secondary hyperparathyroidism of renal origin: Secondary | ICD-10-CM | POA: Diagnosis not present

## 2015-03-18 DIAGNOSIS — E119 Type 2 diabetes mellitus without complications: Secondary | ICD-10-CM | POA: Diagnosis not present

## 2015-03-18 DIAGNOSIS — N186 End stage renal disease: Secondary | ICD-10-CM | POA: Diagnosis not present

## 2015-03-18 DIAGNOSIS — D509 Iron deficiency anemia, unspecified: Secondary | ICD-10-CM | POA: Diagnosis not present

## 2015-03-21 DIAGNOSIS — E119 Type 2 diabetes mellitus without complications: Secondary | ICD-10-CM | POA: Diagnosis not present

## 2015-03-21 DIAGNOSIS — N186 End stage renal disease: Secondary | ICD-10-CM | POA: Diagnosis not present

## 2015-03-21 DIAGNOSIS — D631 Anemia in chronic kidney disease: Secondary | ICD-10-CM | POA: Diagnosis not present

## 2015-03-21 DIAGNOSIS — D509 Iron deficiency anemia, unspecified: Secondary | ICD-10-CM | POA: Diagnosis not present

## 2015-03-21 DIAGNOSIS — N2581 Secondary hyperparathyroidism of renal origin: Secondary | ICD-10-CM | POA: Diagnosis not present

## 2015-03-23 DIAGNOSIS — D631 Anemia in chronic kidney disease: Secondary | ICD-10-CM | POA: Diagnosis not present

## 2015-03-23 DIAGNOSIS — N2581 Secondary hyperparathyroidism of renal origin: Secondary | ICD-10-CM | POA: Diagnosis not present

## 2015-03-23 DIAGNOSIS — D509 Iron deficiency anemia, unspecified: Secondary | ICD-10-CM | POA: Diagnosis not present

## 2015-03-23 DIAGNOSIS — E119 Type 2 diabetes mellitus without complications: Secondary | ICD-10-CM | POA: Diagnosis not present

## 2015-03-23 DIAGNOSIS — N186 End stage renal disease: Secondary | ICD-10-CM | POA: Diagnosis not present

## 2015-03-25 DIAGNOSIS — D509 Iron deficiency anemia, unspecified: Secondary | ICD-10-CM | POA: Diagnosis not present

## 2015-03-25 DIAGNOSIS — N2581 Secondary hyperparathyroidism of renal origin: Secondary | ICD-10-CM | POA: Diagnosis not present

## 2015-03-25 DIAGNOSIS — N186 End stage renal disease: Secondary | ICD-10-CM | POA: Diagnosis not present

## 2015-03-25 DIAGNOSIS — E119 Type 2 diabetes mellitus without complications: Secondary | ICD-10-CM | POA: Diagnosis not present

## 2015-03-25 DIAGNOSIS — D631 Anemia in chronic kidney disease: Secondary | ICD-10-CM | POA: Diagnosis not present

## 2015-03-28 DIAGNOSIS — N2581 Secondary hyperparathyroidism of renal origin: Secondary | ICD-10-CM | POA: Diagnosis not present

## 2015-03-28 DIAGNOSIS — E119 Type 2 diabetes mellitus without complications: Secondary | ICD-10-CM | POA: Diagnosis not present

## 2015-03-28 DIAGNOSIS — D509 Iron deficiency anemia, unspecified: Secondary | ICD-10-CM | POA: Diagnosis not present

## 2015-03-28 DIAGNOSIS — D631 Anemia in chronic kidney disease: Secondary | ICD-10-CM | POA: Diagnosis not present

## 2015-03-28 DIAGNOSIS — N186 End stage renal disease: Secondary | ICD-10-CM | POA: Diagnosis not present

## 2015-03-30 DIAGNOSIS — D509 Iron deficiency anemia, unspecified: Secondary | ICD-10-CM | POA: Diagnosis not present

## 2015-03-30 DIAGNOSIS — E119 Type 2 diabetes mellitus without complications: Secondary | ICD-10-CM | POA: Diagnosis not present

## 2015-03-30 DIAGNOSIS — N186 End stage renal disease: Secondary | ICD-10-CM | POA: Diagnosis not present

## 2015-03-30 DIAGNOSIS — N2581 Secondary hyperparathyroidism of renal origin: Secondary | ICD-10-CM | POA: Diagnosis not present

## 2015-03-30 DIAGNOSIS — D631 Anemia in chronic kidney disease: Secondary | ICD-10-CM | POA: Diagnosis not present

## 2015-04-01 DIAGNOSIS — N186 End stage renal disease: Secondary | ICD-10-CM | POA: Diagnosis not present

## 2015-04-01 DIAGNOSIS — D631 Anemia in chronic kidney disease: Secondary | ICD-10-CM | POA: Diagnosis not present

## 2015-04-01 DIAGNOSIS — D509 Iron deficiency anemia, unspecified: Secondary | ICD-10-CM | POA: Diagnosis not present

## 2015-04-01 DIAGNOSIS — N2581 Secondary hyperparathyroidism of renal origin: Secondary | ICD-10-CM | POA: Diagnosis not present

## 2015-04-01 DIAGNOSIS — E119 Type 2 diabetes mellitus without complications: Secondary | ICD-10-CM | POA: Diagnosis not present

## 2015-04-04 DIAGNOSIS — N2581 Secondary hyperparathyroidism of renal origin: Secondary | ICD-10-CM | POA: Diagnosis not present

## 2015-04-04 DIAGNOSIS — N186 End stage renal disease: Secondary | ICD-10-CM | POA: Diagnosis not present

## 2015-04-04 DIAGNOSIS — E119 Type 2 diabetes mellitus without complications: Secondary | ICD-10-CM | POA: Diagnosis not present

## 2015-04-04 DIAGNOSIS — D509 Iron deficiency anemia, unspecified: Secondary | ICD-10-CM | POA: Diagnosis not present

## 2015-04-04 DIAGNOSIS — D631 Anemia in chronic kidney disease: Secondary | ICD-10-CM | POA: Diagnosis not present

## 2015-04-06 DIAGNOSIS — N2581 Secondary hyperparathyroidism of renal origin: Secondary | ICD-10-CM | POA: Diagnosis not present

## 2015-04-06 DIAGNOSIS — D509 Iron deficiency anemia, unspecified: Secondary | ICD-10-CM | POA: Diagnosis not present

## 2015-04-06 DIAGNOSIS — D631 Anemia in chronic kidney disease: Secondary | ICD-10-CM | POA: Diagnosis not present

## 2015-04-06 DIAGNOSIS — N186 End stage renal disease: Secondary | ICD-10-CM | POA: Diagnosis not present

## 2015-04-06 DIAGNOSIS — E119 Type 2 diabetes mellitus without complications: Secondary | ICD-10-CM | POA: Diagnosis not present

## 2015-04-08 DIAGNOSIS — E119 Type 2 diabetes mellitus without complications: Secondary | ICD-10-CM | POA: Diagnosis not present

## 2015-04-08 DIAGNOSIS — D631 Anemia in chronic kidney disease: Secondary | ICD-10-CM | POA: Diagnosis not present

## 2015-04-08 DIAGNOSIS — N186 End stage renal disease: Secondary | ICD-10-CM | POA: Diagnosis not present

## 2015-04-08 DIAGNOSIS — D509 Iron deficiency anemia, unspecified: Secondary | ICD-10-CM | POA: Diagnosis not present

## 2015-04-08 DIAGNOSIS — N2581 Secondary hyperparathyroidism of renal origin: Secondary | ICD-10-CM | POA: Diagnosis not present

## 2015-04-11 DIAGNOSIS — N186 End stage renal disease: Secondary | ICD-10-CM | POA: Diagnosis not present

## 2015-04-11 DIAGNOSIS — D631 Anemia in chronic kidney disease: Secondary | ICD-10-CM | POA: Diagnosis not present

## 2015-04-11 DIAGNOSIS — N2581 Secondary hyperparathyroidism of renal origin: Secondary | ICD-10-CM | POA: Diagnosis not present

## 2015-04-11 DIAGNOSIS — D509 Iron deficiency anemia, unspecified: Secondary | ICD-10-CM | POA: Diagnosis not present

## 2015-04-11 DIAGNOSIS — E119 Type 2 diabetes mellitus without complications: Secondary | ICD-10-CM | POA: Diagnosis not present

## 2015-04-13 DIAGNOSIS — N186 End stage renal disease: Secondary | ICD-10-CM | POA: Diagnosis not present

## 2015-04-13 DIAGNOSIS — N2581 Secondary hyperparathyroidism of renal origin: Secondary | ICD-10-CM | POA: Diagnosis not present

## 2015-04-13 DIAGNOSIS — E119 Type 2 diabetes mellitus without complications: Secondary | ICD-10-CM | POA: Diagnosis not present

## 2015-04-13 DIAGNOSIS — D509 Iron deficiency anemia, unspecified: Secondary | ICD-10-CM | POA: Diagnosis not present

## 2015-04-13 DIAGNOSIS — D631 Anemia in chronic kidney disease: Secondary | ICD-10-CM | POA: Diagnosis not present

## 2015-04-14 DIAGNOSIS — E1129 Type 2 diabetes mellitus with other diabetic kidney complication: Secondary | ICD-10-CM | POA: Diagnosis not present

## 2015-04-14 DIAGNOSIS — Z992 Dependence on renal dialysis: Secondary | ICD-10-CM | POA: Diagnosis not present

## 2015-04-14 DIAGNOSIS — N186 End stage renal disease: Secondary | ICD-10-CM | POA: Diagnosis not present

## 2015-04-15 DIAGNOSIS — D509 Iron deficiency anemia, unspecified: Secondary | ICD-10-CM | POA: Diagnosis not present

## 2015-04-15 DIAGNOSIS — D631 Anemia in chronic kidney disease: Secondary | ICD-10-CM | POA: Diagnosis not present

## 2015-04-15 DIAGNOSIS — N186 End stage renal disease: Secondary | ICD-10-CM | POA: Diagnosis not present

## 2015-04-15 DIAGNOSIS — E119 Type 2 diabetes mellitus without complications: Secondary | ICD-10-CM | POA: Diagnosis not present

## 2015-04-15 DIAGNOSIS — N2581 Secondary hyperparathyroidism of renal origin: Secondary | ICD-10-CM | POA: Diagnosis not present

## 2015-04-18 DIAGNOSIS — D631 Anemia in chronic kidney disease: Secondary | ICD-10-CM | POA: Diagnosis not present

## 2015-04-18 DIAGNOSIS — D509 Iron deficiency anemia, unspecified: Secondary | ICD-10-CM | POA: Diagnosis not present

## 2015-04-18 DIAGNOSIS — E119 Type 2 diabetes mellitus without complications: Secondary | ICD-10-CM | POA: Diagnosis not present

## 2015-04-18 DIAGNOSIS — N2581 Secondary hyperparathyroidism of renal origin: Secondary | ICD-10-CM | POA: Diagnosis not present

## 2015-04-18 DIAGNOSIS — N186 End stage renal disease: Secondary | ICD-10-CM | POA: Diagnosis not present

## 2015-04-20 DIAGNOSIS — N186 End stage renal disease: Secondary | ICD-10-CM | POA: Diagnosis not present

## 2015-04-20 DIAGNOSIS — N2581 Secondary hyperparathyroidism of renal origin: Secondary | ICD-10-CM | POA: Diagnosis not present

## 2015-04-20 DIAGNOSIS — D631 Anemia in chronic kidney disease: Secondary | ICD-10-CM | POA: Diagnosis not present

## 2015-04-20 DIAGNOSIS — D509 Iron deficiency anemia, unspecified: Secondary | ICD-10-CM | POA: Diagnosis not present

## 2015-04-20 DIAGNOSIS — E119 Type 2 diabetes mellitus without complications: Secondary | ICD-10-CM | POA: Diagnosis not present

## 2015-04-22 DIAGNOSIS — N2581 Secondary hyperparathyroidism of renal origin: Secondary | ICD-10-CM | POA: Diagnosis not present

## 2015-04-22 DIAGNOSIS — N186 End stage renal disease: Secondary | ICD-10-CM | POA: Diagnosis not present

## 2015-04-22 DIAGNOSIS — D509 Iron deficiency anemia, unspecified: Secondary | ICD-10-CM | POA: Diagnosis not present

## 2015-04-22 DIAGNOSIS — E119 Type 2 diabetes mellitus without complications: Secondary | ICD-10-CM | POA: Diagnosis not present

## 2015-04-22 DIAGNOSIS — D631 Anemia in chronic kidney disease: Secondary | ICD-10-CM | POA: Diagnosis not present

## 2015-04-25 ENCOUNTER — Emergency Department (HOSPITAL_COMMUNITY): Payer: Medicare Other

## 2015-04-25 ENCOUNTER — Emergency Department (HOSPITAL_COMMUNITY)
Admission: EM | Admit: 2015-04-25 | Discharge: 2015-04-25 | Disposition: A | Discharge status: Home / Self Care | Payer: Medicare Other | Attending: Emergency Medicine | Admitting: Emergency Medicine

## 2015-04-25 ENCOUNTER — Encounter (HOSPITAL_COMMUNITY): Payer: Self-pay | Admitting: Emergency Medicine

## 2015-04-25 DIAGNOSIS — N2581 Secondary hyperparathyroidism of renal origin: Secondary | ICD-10-CM | POA: Diagnosis not present

## 2015-04-25 DIAGNOSIS — E785 Hyperlipidemia, unspecified: Secondary | ICD-10-CM | POA: Insufficient documentation

## 2015-04-25 DIAGNOSIS — Z87891 Personal history of nicotine dependence: Secondary | ICD-10-CM | POA: Diagnosis not present

## 2015-04-25 DIAGNOSIS — G4089 Other seizures: Secondary | ICD-10-CM | POA: Diagnosis not present

## 2015-04-25 DIAGNOSIS — Z992 Dependence on renal dialysis: Secondary | ICD-10-CM | POA: Insufficient documentation

## 2015-04-25 DIAGNOSIS — Z9889 Other specified postprocedural states: Secondary | ICD-10-CM | POA: Insufficient documentation

## 2015-04-25 DIAGNOSIS — R569 Unspecified convulsions: Secondary | ICD-10-CM | POA: Diagnosis not present

## 2015-04-25 DIAGNOSIS — Z794 Long term (current) use of insulin: Secondary | ICD-10-CM | POA: Diagnosis not present

## 2015-04-25 DIAGNOSIS — E1165 Type 2 diabetes mellitus with hyperglycemia: Secondary | ICD-10-CM | POA: Diagnosis not present

## 2015-04-25 DIAGNOSIS — N186 End stage renal disease: Secondary | ICD-10-CM | POA: Diagnosis not present

## 2015-04-25 DIAGNOSIS — Z7902 Long term (current) use of antithrombotics/antiplatelets: Secondary | ICD-10-CM | POA: Diagnosis not present

## 2015-04-25 DIAGNOSIS — Z8619 Personal history of other infectious and parasitic diseases: Secondary | ICD-10-CM | POA: Insufficient documentation

## 2015-04-25 DIAGNOSIS — E669 Obesity, unspecified: Secondary | ICD-10-CM | POA: Insufficient documentation

## 2015-04-25 DIAGNOSIS — M109 Gout, unspecified: Secondary | ICD-10-CM | POA: Insufficient documentation

## 2015-04-25 DIAGNOSIS — Z8673 Personal history of transient ischemic attack (TIA), and cerebral infarction without residual deficits: Secondary | ICD-10-CM | POA: Diagnosis not present

## 2015-04-25 DIAGNOSIS — I5022 Chronic systolic (congestive) heart failure: Secondary | ICD-10-CM | POA: Diagnosis not present

## 2015-04-25 DIAGNOSIS — J449 Chronic obstructive pulmonary disease, unspecified: Secondary | ICD-10-CM | POA: Insufficient documentation

## 2015-04-25 DIAGNOSIS — I2510 Atherosclerotic heart disease of native coronary artery without angina pectoris: Secondary | ICD-10-CM | POA: Diagnosis not present

## 2015-04-25 DIAGNOSIS — Z79899 Other long term (current) drug therapy: Secondary | ICD-10-CM | POA: Diagnosis not present

## 2015-04-25 DIAGNOSIS — D509 Iron deficiency anemia, unspecified: Secondary | ICD-10-CM | POA: Diagnosis not present

## 2015-04-25 DIAGNOSIS — R739 Hyperglycemia, unspecified: Secondary | ICD-10-CM

## 2015-04-25 DIAGNOSIS — I120 Hypertensive chronic kidney disease with stage 5 chronic kidney disease or end stage renal disease: Secondary | ICD-10-CM | POA: Diagnosis not present

## 2015-04-25 DIAGNOSIS — Z7982 Long term (current) use of aspirin: Secondary | ICD-10-CM | POA: Insufficient documentation

## 2015-04-25 DIAGNOSIS — E119 Type 2 diabetes mellitus without complications: Secondary | ICD-10-CM | POA: Diagnosis not present

## 2015-04-25 DIAGNOSIS — G40909 Epilepsy, unspecified, not intractable, without status epilepticus: Secondary | ICD-10-CM | POA: Diagnosis not present

## 2015-04-25 DIAGNOSIS — D631 Anemia in chronic kidney disease: Secondary | ICD-10-CM | POA: Diagnosis not present

## 2015-04-25 LAB — COMPREHENSIVE METABOLIC PANEL
ALBUMIN: 3.3 g/dL — AB (ref 3.5–5.0)
ALK PHOS: 65 U/L (ref 38–126)
ALT: 21 U/L (ref 17–63)
AST: 32 U/L (ref 15–41)
Anion gap: 17 — ABNORMAL HIGH (ref 5–15)
BUN: 57 mg/dL — ABNORMAL HIGH (ref 6–20)
CALCIUM: 7.8 mg/dL — AB (ref 8.9–10.3)
CO2: 25 mmol/L (ref 22–32)
Chloride: 93 mmol/L — ABNORMAL LOW (ref 101–111)
Creatinine, Ser: 10.73 mg/dL — ABNORMAL HIGH (ref 0.61–1.24)
GFR calc non Af Amer: 4 mL/min — ABNORMAL LOW (ref >60)
GFR, EST AFRICAN AMERICAN: 5 mL/min — AB (ref >60)
Glucose, Bld: 619 mg/dL (ref 65–99)
POTASSIUM: 3.8 mmol/L (ref 3.5–5.1)
SODIUM: 135 mmol/L (ref 135–145)
TOTAL PROTEIN: 6.1 g/dL — AB (ref 6.5–8.1)
Total Bilirubin: 0.4 mg/dL (ref 0.3–1.2)

## 2015-04-25 LAB — CBC WITH DIFFERENTIAL/PLATELET
Basophils Absolute: 0 10*3/uL (ref 0.0–0.1)
Basophils Relative: 0 % (ref 0–1)
Eosinophils Absolute: 0.2 10*3/uL (ref 0.0–0.7)
Eosinophils Relative: 3 % (ref 0–5)
HEMATOCRIT: 29.5 % — AB (ref 39.0–52.0)
HEMOGLOBIN: 10.4 g/dL — AB (ref 13.0–17.0)
LYMPHS ABS: 1.2 10*3/uL (ref 0.7–4.0)
LYMPHS PCT: 17 % (ref 12–46)
MCH: 30 pg (ref 26.0–34.0)
MCHC: 35.3 g/dL (ref 30.0–36.0)
MCV: 85 fL (ref 78.0–100.0)
MONOS PCT: 6 % (ref 3–12)
Monocytes Absolute: 0.4 10*3/uL (ref 0.1–1.0)
Neutro Abs: 5.4 10*3/uL (ref 1.7–7.7)
Neutrophils Relative %: 74 % (ref 43–77)
Platelets: 169 10*3/uL (ref 150–400)
RBC: 3.47 MIL/uL — ABNORMAL LOW (ref 4.22–5.81)
RDW: 14.5 % (ref 11.5–15.5)
WBC: 7.2 10*3/uL (ref 4.0–10.5)

## 2015-04-25 LAB — CBG MONITORING, ED
GLUCOSE-CAPILLARY: 557 mg/dL — AB (ref 65–99)
Glucose-Capillary: 415 mg/dL — ABNORMAL HIGH (ref 65–99)

## 2015-04-25 MED ORDER — SODIUM CHLORIDE 0.9 % IV SOLN
1000.0000 mL | Freq: Once | INTRAVENOUS | Status: AC
  Administered 2015-04-25: 1000 mL via INTRAVENOUS

## 2015-04-25 MED ORDER — SODIUM CHLORIDE 0.9 % IV SOLN
1000.0000 mL | INTRAVENOUS | Status: DC
  Administered 2015-04-25: 1000 mL via INTRAVENOUS

## 2015-04-25 MED ORDER — LEVETIRACETAM 500 MG PO TABS: 500.0000 mg | ORAL_TABLET | Freq: Two times a day (BID) | ORAL | Status: DC

## 2015-04-25 MED ORDER — LORAZEPAM 2 MG/ML IJ SOLN
1.0000 mg | Freq: Once | INTRAMUSCULAR | Status: AC
  Administered 2015-04-25: 1 mg via INTRAVENOUS
  Filled 2015-04-25: qty 1

## 2015-04-25 MED ORDER — INSULIN ASPART 100 UNIT/ML ~~LOC~~ SOLN
10.0000 [IU] | Freq: Once | SUBCUTANEOUS | Status: AC
  Administered 2015-04-25: 10 [IU] via INTRAVENOUS
  Filled 2015-04-25: qty 1

## 2015-04-25 MED ORDER — SODIUM CHLORIDE 0.9 % IV SOLN
1000.0000 mg | Freq: Once | INTRAVENOUS | Status: AC
  Administered 2015-04-25: 1000 mg via INTRAVENOUS
  Filled 2015-04-25: qty 10

## 2015-04-25 NOTE — ED Notes (Signed)
Pt actively seizing for about 60-90 seconds, full body twitching. Dr. Venora Maples at bedside to evaluate and reassure family. Pt turned to L side. No emesis, incontinence. Pupils dilated. Pt became alert almost immediately after seizure subsided.

## 2015-04-25 NOTE — ED Provider Notes (Signed)
CSN: [id number]     Arrival date & time 04/25/15  0344 History   First MD Initiated Contact with Patient 04/25/15 0350     Chief Complaint  Patient presents with  . Seizures  . Hyperglycemia     Level 5 caveat: Postictal  HPI Patient is brought to the emergency department after initial call was for right hand cramping.  EMS found the patient of focal seizure in his right hand that then generalized.  Patient has a history of CVA with some residual right-sided weakness per EMS.  In route the patient had a recurrent seizure the patient was given 2.5 mg of IV Versed by EMS.  Blood sugar was found to be elevated at 557.  Family reports no recent illness or vomiting or fever.   Past Medical History  Diagnosis Date  . Hyperlipidemia   . Retinopathy   . Gout   . ESRD (end stage renal disease)     Started HD in [place] in 2009, ESRD was due to DM. Moved to [facility] in Dec 2009 and now gets dialysis at [facility] on a MWF schedule.     . Type II or unspecified type diabetes mellitus without mention of complication, not stated as uncontrolled     adult onset  . Erectile dysfunction     penile implant  . Abnormal stress test     s/p cath November 2013 with modest disease involving the ostial left main, proximal LAD, proximal RCA - do not appear to be hemodynamically signficant; mild LV dysfunction  . Obesity   . Hypertension   . Hepatitis C   . COPD (chronic obstructive pulmonary disease)   . Coronary artery disease     per cath report 2013  . Ejection fraction < 50%   . Chronic systolic CHF (congestive heart failure)   . Bacteremia   . Shortness of breath     Hx: of with exertion  . Gout     Hx: of  . Stroke    Past Surgical History  Procedure Laterality Date  . Penile prosthesis implant    . Fistula      RUE and wrist  . Cardiac catheterization  August 26, 2012  . Eye surgery      laser.  and surgery for DM  . Insertion of dialysis catheter Right 01/01/2013    Procedure:  INSERTION OF DIALYSIS CATHETER right  internal jugular;  Surgeon: Serafina Mitchell, MD;  Location: [place];  Service: Vascular;  Laterality: Right;  . Av fistula placement Left 01/13/2013    Procedure: INSERTION OF ARTERIOVENOUS (AV) GORE-TEX GRAFT ARM;  Surgeon: Angelia Mould, MD;  Location: [place];  Service: Vascular;  Laterality: Left;  . Thrombectomy w/ embolectomy Left 03/26/2013    Procedure: Attempted thrombectomy of left arm arteriovenous goretex graft.;  Surgeon: Angelia Mould, MD;  Location: [place];  Service: Vascular;  Laterality: Left;  . Insertion of dialysis catheter N/A 03/26/2013    Procedure: INSERTION OF DIALYSIS CATHETER Left internal jugular vein;  Surgeon: Angelia Mould, MD;  Location: [place];  Service: Vascular;  Laterality: N/A;  . Avgg removal Left 03/26/2013    Procedure: REMOVAL OF  NON INCORPORATED ARTERIOVENOUS GORETEX GRAFT ([place]) left arm * repair of  left brachial artery with vein patch angioplasty.;  Surgeon: Angelia Mould, MD;  Location: [place];  Service: Vascular;  Laterality: Left;  . Av fistula placement Left 05/05/2013    Procedure: INSERTION OF LEFT  UPPER ARM  ARTERIOVENOUS GORTEX GRAFT;  Surgeon: Angelia Mould, MD;  Location: [place];  Service: Vascular;  Laterality: Left;  . Tee without cardioversion N/A 06/11/2013    Procedure: TRANSESOPHAGEAL ECHOCARDIOGRAM (TEE);  Surgeon: Larey Dresser, MD;  Location: [place];  Service: Cardiovascular;  Laterality: N/A;  . Embolectomy Right 08/27/2013    Procedure: EMBOLECTOMY;  Surgeon: Mal Misty, MD;  Location: [place];  Service: Vascular;  Laterality: Right;  Thrombectomy of Radial and ulnar artery.  . Cataract extraction w/ intraocular lens  implant, bilateral    . Colonoscopy    . Ligation of arteriovenous  fistula Right 12/30/2013    Procedure: REMOVAL OF SEGMENT OF GORTEX GRAFT AND FISTULA  AND REPAIR OF BRACHIAL ARTERY;  Surgeon: Mal Misty, MD;  Location: [place];  Service:  Vascular;  Laterality: Right;  Annell Greening N/A 04/07/2013    Procedure: VENOGRAM;  Surgeon: Serafina Mitchell, MD;  Location: [facility] CATH LAB;  Service: Cardiovascular;  Laterality: N/A;   Family History  Problem Relation Age of Onset  . Heart disease Father   . Diabetes Sister   . Diabetes Brother   . Diabetes Son    History  Substance Use Topics  . Smoking status: Former Smoker -- 7 years    Quit date: 06/10/1973  . Smokeless tobacco: Never Used  . Alcohol Use: No    Review of Systems  All other systems reviewed and are negative.     Allergies  Review of patient's allergies indicates no known allergies.  Home Medications   Prior to Admission medications   Medication Sig Start Date End Date Taking? Authorizing Provider  allopurinol (ZYLOPRIM) 100 MG tablet  12/13/14   Historical Provider, MD  amitriptyline (ELAVIL) 25 MG tablet TAKE ONE TABLET BY MOUTH AT BEDTIME FOR  NEUROPATHY 03/18/14   Bronson Ing, DPM  aspirin EC 81 MG tablet Take 81 mg by mouth daily. 06/18/13   Carlena Bjornstad, MD  Calcium Acetate 667 MG TABS Take 1,334 tablets by mouth 3 (three) times daily with meals.     Historical Provider, MD  cinacalcet (SENSIPAR) 60 MG tablet Take 60 mg by mouth 2 (two) times daily.     Historical Provider, MD  clopidogrel (PLAVIX) 75 MG tablet Take 75 mg by mouth daily.     Historical Provider, MD  colchicine 0.6 MG tablet  12/13/14   Historical Provider, MD  HYDROcodone-acetaminophen (NORCO) 5-325 MG per tablet Take 1 tablet by mouth every 6 (six) hours as needed for moderate pain. 10/02/14   Frazier Richards, MD  insulin NPH (HUMULIN N,NOVOLIN N) 100 UNIT/ML injection Inject 20 Units into the skin 2 (two) times daily.     Historical Provider, MD  insulin regular (NOVOLIN R,HUMULIN R) 100 units/mL injection Inject 20-30 Units into the skin 3 (three) times daily before meals.     Historical Provider, MD  lidocaine-prilocaine (EMLA) cream Apply 1 application topically as needed  (dialysis).    Historical Provider, MD  metoprolol (LOPRESSOR) 50 MG tablet  01/27/15   Historical Provider, MD  multivitamin (RENA-VIT) TABS tablet Take 1 tablet by mouth daily.     Historical Provider, MD  sevelamer (RENVELA) 800 MG tablet Take 800-3,200 mg by mouth 5 (five) times daily. 4 tablets three times daily with meals and 1 tablet twice daily with snacks.    Historical Provider, MD  simvastatin (ZOCOR) 20 MG tablet Take 20 mg by mouth daily at 6 PM.  Historical Provider, MD   BP 193/57 mmHg  Pulse 81  Temp(Src) 98.8 F (37.1 C) (Oral)  Resp 22  Ht 5\' 9"  (1.753 m)  Wt 245 lb (111.131 kg)  BMI 36.16 kg/m2  SpO2 90% Physical Exam  Constitutional: He is oriented to person, place, and time. He appears well-developed and well-nourished.  HENT:  Head: Normocephalic and atraumatic.  Eyes: EOM are normal. Pupils are equal, round, and reactive to light.  Neck: Normal range of motion.  Cardiovascular: Normal rate, regular rhythm, normal heart sounds and intact distal pulses.   Pulmonary/Chest: Effort normal and breath sounds normal. No respiratory distress.  Abdominal: Soft. He exhibits no distension. There is no tenderness.  Musculoskeletal: Normal range of motion.  Neurological: He is alert and oriented to person, place, and time.  5/5 strength in major muscle groups of  bilateral upper and lower extremities. Speech normal. No facial asymetry.   Skin: Skin is warm and dry.  Psychiatric: He has a normal mood and affect. Judgment normal.  Nursing note and vitals reviewed.   ED Course  Procedures (including critical care time) Labs Review Labs Reviewed  CBC WITH DIFFERENTIAL/PLATELET - Abnormal; Notable for the following:    RBC 3.47 (*)    Hemoglobin 10.4 (*)    HCT 29.5 (*)    All other components within normal limits  COMPREHENSIVE METABOLIC PANEL - Abnormal; Notable for the following:    Chloride 93 (*)    Glucose, Bld 619 (*)    BUN 57 (*)    Creatinine, Ser 10.73  (*)    Calcium 7.8 (*)    Total Protein 6.1 (*)    Albumin 3.3 (*)    GFR calc non Af Amer 4 (*)    GFR calc Af Amer 5 (*)    Anion gap 17 (*)    All other components within normal limits  CBG MONITORING, ED - Abnormal; Notable for the following:    Glucose-Capillary 557 (*)    All other components within normal limits  CBG MONITORING, ED - Abnormal; Notable for the following:    Glucose-Capillary 415 (*)    All other components within normal limits    Imaging Review Ct Head Wo Contrast  04/25/2015   CLINICAL DATA:  Episode of RIGHT hand cramping/focal seizure, now resolved. History of end-stage renal disease.  EXAM: CT HEAD WITHOUT CONTRAST  TECHNIQUE: Contiguous axial images were obtained from the base of the skull through the vertex without intravenous contrast.  COMPARISON:  CT head September 21, 2014  FINDINGS: Moderate ventriculomegaly, likely on the basis of global parenchymal brain volume loss as there is overall commensurate enlargement of cerebral sulci and cerebellar folia. Small old RIGHT inferior cerebellar infarcts. Patchy white matter changes, similar to prior examination, including pontine involvement. No intraparenchymal hemorrhage, mass effect, midline shift or acute large vascular territory infarct. Linear densities in the bifrontal lobes may reflect vessels , were present previously.  No abnormal extra-axial fluid collections. Basal cisterns are patent. Severe calcific atherosclerosis of the carotid siphons.  No skull fracture. The included ocular globes and orbital contents are non-suspicious. Status post bilateral ocular lens implants. Paranasal sinuses are well aerated. Trace bilateral mastoid effusions. Extensive small vessel calcifications in the scalp.  IMPRESSION: No acute intracranial process.  Chronic changes including moderate brain atrophy, advanced for age though stable from prior imaging. Moderate white matter changes compatible with chronic small vessel ischemic  disease.   Electronically Signed   By: Thana Farr.D.  On: 04/25/2015 04:45  I personally reviewed the imaging tests through PACS system I reviewed available ER/hospitalization records through the EMR    EKG Interpretation None      MDM   Final diagnoses:  Seizure  Hyperglycemia  End stage renal disease    Patient became more alert during the initial history and physical examination.  No active seizures at this time.  Patient will undergo labs and a CT scan of his head  5:06 AM Patient developed a another generalized tonic-clonic seizure while in the emergency department.  This was personally seen by myself.  The resolved after approximately 30-40 seconds.  At this time I will load the patient with IV Keppra.  In regards to his elevated blood sugar this is likely secondary to seizure.  Nothing to suggest DKA.  Bicarbonate is 25.  Plan will be continued observation the emergency department.  Hopefully we will be old to discharge the patient home on Keppra with outpatient neurology follow-up  7:16 AM Pt feeling better. Outpatient neurology follow up. pcp follow up. Seizure precautions given. Home on [place], MD 04/25/15 [phone]

## 2015-04-25 NOTE — ED Notes (Signed)
EMS reports called out for "hand cramping"; EMS arrived to find patient having focal seizure in right hand that advanced up arm; pt reports hx of CVA with right sided residual weakness; pt given 2.5mg  midazolam enroute by EMS; pt was also found to have high CBG at 557

## 2015-04-27 ENCOUNTER — Ambulatory Visit (INDEPENDENT_AMBULATORY_CARE_PROVIDER_SITE_OTHER): Payer: Medicare Other | Admitting: Neurology

## 2015-04-27 ENCOUNTER — Encounter: Payer: Self-pay | Admitting: Neurology

## 2015-04-27 VITALS — BP 158/62 | HR 64 | Ht 69.0 in | Wt 227.0 lb

## 2015-04-27 DIAGNOSIS — N186 End stage renal disease: Secondary | ICD-10-CM | POA: Diagnosis not present

## 2015-04-27 DIAGNOSIS — G40109 Localization-related (focal) (partial) symptomatic epilepsy and epileptic syndromes with simple partial seizures, not intractable, without status epilepticus: Secondary | ICD-10-CM | POA: Diagnosis not present

## 2015-04-27 DIAGNOSIS — D509 Iron deficiency anemia, unspecified: Secondary | ICD-10-CM | POA: Diagnosis not present

## 2015-04-27 DIAGNOSIS — Z8673 Personal history of transient ischemic attack (TIA), and cerebral infarction without residual deficits: Secondary | ICD-10-CM

## 2015-04-27 DIAGNOSIS — D631 Anemia in chronic kidney disease: Secondary | ICD-10-CM | POA: Diagnosis not present

## 2015-04-27 DIAGNOSIS — Z992 Dependence on renal dialysis: Secondary | ICD-10-CM

## 2015-04-27 DIAGNOSIS — E1122 Type 2 diabetes mellitus with diabetic chronic kidney disease: Secondary | ICD-10-CM

## 2015-04-27 DIAGNOSIS — N189 Chronic kidney disease, unspecified: Secondary | ICD-10-CM

## 2015-04-27 DIAGNOSIS — I639 Cerebral infarction, unspecified: Secondary | ICD-10-CM | POA: Diagnosis not present

## 2015-04-27 DIAGNOSIS — N2581 Secondary hyperparathyroidism of renal origin: Secondary | ICD-10-CM | POA: Diagnosis not present

## 2015-04-27 DIAGNOSIS — I679 Cerebrovascular disease, unspecified: Secondary | ICD-10-CM

## 2015-04-27 DIAGNOSIS — E119 Type 2 diabetes mellitus without complications: Secondary | ICD-10-CM | POA: Diagnosis not present

## 2015-04-27 DIAGNOSIS — G40409 Other generalized epilepsy and epileptic syndromes, not intractable, without status epilepticus: Secondary | ICD-10-CM | POA: Insufficient documentation

## 2015-04-27 DIAGNOSIS — I10 Essential (primary) hypertension: Secondary | ICD-10-CM

## 2015-04-27 MED ORDER — LEVETIRACETAM 500 MG PO TABS: 500.0000 mg | ORAL_TABLET | Freq: Two times a day (BID) | ORAL | Status: DC

## 2015-04-27 NOTE — Patient Instructions (Addendum)
1.  Continue Keppra 500mg  twice daily (one in morning and one at night) 2.  No driving 3.  MRI of brain without contrast [facility]  05/06/15 11:45am  4.  Follow up in 3 months.

## 2015-04-27 NOTE — Progress Notes (Signed)
NEUROLOGY FOLLOW UP OFFICE NOTE  Ernest CRONEY [id number]  HISTORY OF PRESENT ILLNESS: Ernest [name] is a 69 year old right-handed man with hyperlipidemia, ESRD on HD (M/W/F), type II diabetes mellitus, hypertension, COPD, gout, chronic CHF (EF 30-35%), coronary artery disease, hepatitis C and history of stroke who follows up for new issue, new-onset seizure.  History obtained from patient and ED note.  CT head images and labs reviewed.  UPDATE: On 04/25/15, he developed right hand cramping and dystonia.  When EMS arrived, he had jerking of the right hand that then generalized to a tonic-clonic seizure.  He had two other seizures, in the ambulance and in the ED, lasting 30-40 seconds.  Blood sugar was 557.  Bicarbonate was 25.  Cr clearance 9.86 mL/min.  CT of the head showed chronic small vessel disease and moderate brain atrophy.  He was started on Keppra 500mg  twice daily.  HISTORY: Around October, he developed slurred speech.  He also experienced ringing in his right ear, with sensation of water in his right ear.  He did not seek medical attention at the time.  There was no facial droop or difficulty walking or swallowing.  He decided to go to the ED on 09/21/14 for an evaluation as the slurred speech and tinnitus persisted.  He denied weakness but ED exam revealed right arm weakness.  CT of the head was performed, which revealed chronic small vessel ischemic changes, including remote right cerebellar, left pontine and right basal ganglia lacunar infarcts.  No acute infarction was seen.  BMP revealed BUN of 50 and creatine of 9.8 with GFR of 6.   CBC was unremarkable.  He already takes Plavix.  TEE from 06/11/13 showed LVEF of 30%.  Hgb A1c from 06/10/13 was 6.6.  Lipid panel from February sowed cholesterol 116, HDL 59 and LDL 45.  Carotid doppler showed no hemodynamically significant stenosis.  No new issues.  He is doing well.  He did see the audiologist on 10/20/14.  Exam revealed primarily  sensorineural low-frequency hearing loss bilaterally, with poor high frequency hearing loss in the right ear with possible conductive/middle ear component.   PAST MEDICAL HISTORY: Past Medical History  Diagnosis Date  . Hyperlipidemia   . Retinopathy   . Gout   . ESRD (end stage renal disease)     Started HD in [place] in 2009, ESRD was due to DM. Moved to [facility] in Dec 2009 and now gets dialysis at [facility] on a MWF schedule.     . Type II or unspecified type diabetes mellitus without mention of complication, not stated as uncontrolled     adult onset  . Erectile dysfunction     penile implant  . Abnormal stress test     s/p cath November 2013 with modest disease involving the ostial left main, proximal LAD, proximal RCA - do not appear to be hemodynamically signficant; mild LV dysfunction  . Obesity   . Hypertension   . Hepatitis C   . COPD (chronic obstructive pulmonary disease)   . Coronary artery disease     per cath report 2013  . Ejection fraction < 50%   . Chronic systolic CHF (congestive heart failure)   . Bacteremia   . Shortness of breath     Hx: of with exertion  . Gout     Hx: of  . Stroke     MEDICATIONS: Current Outpatient Prescriptions on File Prior to Visit  Medication Sig Dispense Refill  .  allopurinol (ZYLOPRIM) 100 MG tablet Take 100 mg by mouth daily.     [name] amitriptyline (ELAVIL) 25 MG tablet TAKE ONE TABLET BY MOUTH AT BEDTIME FOR  NEUROPATHY (Patient taking differently: TAKE ONE TABLET BY MOUTH AT BEDTIME AS NEED  FOR  NEUROPATHY) 90 tablet 0  . aspirin EC 81 MG tablet Take 81 mg by mouth daily.    . Calcium Acetate 667 MG TABS Take 2,001 tablets by mouth 3 (three) times daily with meals.     . cinacalcet (SENSIPAR) 60 MG tablet Take 60 mg by mouth 2 (two) times daily.     . clopidogrel (PLAVIX) 75 MG tablet Take 75 mg by mouth daily.     . colchicine 0.6 MG tablet Take 0.6 mg by mouth daily.     . insulin NPH (HUMULIN N,NOVOLIN N) 100 UNIT/ML injection  Inject 10 Units into the skin 2 (two) times daily.     . insulin regular (NOVOLIN R,HUMULIN R) 100 units/mL injection Inject 30 Units into the skin 3 (three) times daily before meals.     . lidocaine-prilocaine (EMLA) cream Apply 1 application topically as needed (dialysis).    [name] lisinopril (PRINIVIL,ZESTRIL) 20 MG tablet Take 20 mg by mouth daily.    . metoprolol (LOPRESSOR) 50 MG tablet Take 50 mg by mouth daily.     . multivitamin (RENA-VIT) TABS tablet Take 1 tablet by mouth daily.     . sevelamer (RENVELA) 800 MG tablet Take 800-3,200 mg by mouth 5 (five) times daily. 4 tablets three times daily with meals and 1 tablet twice daily with snacks.    . simvastatin (ZOCOR) 20 MG tablet Take 20 mg by mouth daily at 6 PM.      No current facility-administered medications on file prior to visit.    ALLERGIES: [name]  FAMILY HISTORY: Family History  Problem Relation Age of Onset  . Heart disease Father   . Diabetes Sister   . Diabetes Brother   . Diabetes Son     SOCIAL HISTORY: History   Social History  . Marital Status: Married    Spouse Name: N/A  . Number of Children: N/A  . Years of Education: N/A   Occupational History  . 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 Smoking status: Former Smoker -- 7 years    Quit date: 06/10/1973  . Smokeless tobacco: Never Used  . Alcohol Use: No  . Drug Use: No  . Sexual Activity: No   Other Topics Concern  . Not on file   Social History Narrative   Adopted mentally retarded child at home, 2 adopted children, 3 living and 2 deceased.    REVIEW OF SYSTEMS: Constitutional: No fevers, chills, or sweats, no generalized fatigue, change in appetite Eyes: No visual changes, double vision, eye pain Ear, nose and throat: No hearing loss, ear pain, nasal congestion, sore throat Cardiovascular: No chest pain, palpitations Respiratory:  No shortness of breath at rest or with exertion,  wheezes GastrointestinaI: No nausea, vomiting, diarrhea, abdominal pain, fecal incontinence Genitourinary:  No dysuria, urinary retention or frequency Musculoskeletal:  No neck pain, back pain Integumentary: No rash, pruritus, skin lesions Neurological: as above Psychiatric: No depression, insomnia, anxiety Endocrine: No palpitations, fatigue, diaphoresis, mood swings, change in appetite, change in weight, increased thirst Hematologic/Lymphatic:  No anemia, purpura, petechiae. Allergic/Immunologic: no itchy/runny eyes, nasal congestion, recent allergic reactions, rashes  PHYSICAL EXAM: Filed Vitals:   04/27/15 1200  BP: 158/62  Pulse: 64   General: No acute distress.  Patient with mildly overweight body habitus. Head:  Normocephalic/atraumatic Eyes:  Fundoscopic exam unremarkable without vessel changes, exudates, hemorrhages or papilledema. Neck: supple, no paraspinal tenderness, full range of motion Heart:  Regular rate and rhythm Lungs:  Clear to auscultation bilaterally Back: No paraspinal tenderness Neurological Exam: alert and oriented to person, place, and time. Attention span and concentration intact, recent and remote memory intact, fund of knowledge intact.  Speech fluent and not dysarthric, language intact.  CN II-XII intact. Fundoscopic exam unremarkable without vessel changes, exudates, hemorrhages or papilledema.  Bulk and tone normal, muscle strength 5/5 throughout.  Reduced vibration in feet.  Deep tendon reflexes 1+ throughout except absent in ankles, toes downgoing.  Finger to nose testing intact.  Gait mildly wide-based.  IMPRESSION: Isolated symptomatic partial seizure with secondary generalization, due possibly to remote stroke or hyperglycemia.  Even if it may have been precipitated by hyperglycemia, I would treat given his history of stroke. Hypertension CKD on HD Type 2 diabetes  PLAN: 1.  Since it is a new-onset seizure, I would still order MRI of brain  without contrast to rule out other intracranial abnormality 2.  Keppra 500mg  twice daily 3.  No driving 4.  Follow up with PCP for blood pressure recheck 5.  Follow up in 3 months.  Metta Clines, DO  CC:  Corliss Parish

## 2015-04-28 ENCOUNTER — Ambulatory Visit (INDEPENDENT_AMBULATORY_CARE_PROVIDER_SITE_OTHER): Payer: Medicare Other | Admitting: Podiatry

## 2015-04-28 DIAGNOSIS — M79673 Pain in unspecified foot: Secondary | ICD-10-CM

## 2015-04-28 DIAGNOSIS — E1122 Type 2 diabetes mellitus with diabetic chronic kidney disease: Secondary | ICD-10-CM

## 2015-04-28 DIAGNOSIS — B351 Tinea unguium: Secondary | ICD-10-CM | POA: Diagnosis not present

## 2015-04-28 DIAGNOSIS — E119 Type 2 diabetes mellitus without complications: Secondary | ICD-10-CM

## 2015-04-28 DIAGNOSIS — N189 Chronic kidney disease, unspecified: Secondary | ICD-10-CM

## 2015-04-28 NOTE — Progress Notes (Signed)
Patient ID: Ernest [name], male   DOB: 03-01-1946, 69 y.o.   MRN: [id number] Complaint:  Visit Type: Patient returns to my office for continued preventative foot care services. Complaint: Patient states" my nails have grown long and thick and become painful to walk and wear shoes" Patient has been diagnosed with DM with no foot  complications. He presents for preventative foot care services. No changes to ROS  Podiatric Exam: Vascular: dorsalis pedis and posterior tibial pulses are palpable bilateral. Capillary return is immediate. Temperature gradient is WNL. Skin turgor WNL  Sensorium: Normal Semmes Weinstein monofilament test. Normal tactile sensation bilaterally. Nail Exam: Pt has thick disfigured discolored nails with subungual debris noted bilateral entire nail hallux through fifth toenails Ulcer Exam: There is no evidence of ulcer or pre-ulcerative changes or infection. Orthopedic Exam: Muscle tone and strength are WNL. No limitations in general ROM. No crepitus or effusions noted. Foot type and digits show no abnormalities. Bony prominences are unremarkable. Skin: No Porokeratosis. No infection or ulcers  Diagnosis:  Tinea unguium, Pain in right toe, pain in left toes  Treatment & Plan Procedures and Treatment: Consent by patient was obtained for treatment procedures. The patient understood the discussion of treatment and procedures well. All questions were answered thoroughly reviewed. Debridement of mycotic and hypertrophic toenails, 1 through 5 bilateral and clearing of subungual debris. No ulceration, no infection noted.  Return Visit-Office Procedure: Patient instructed to return to the office for a follow up visit 3 months for continued evaluation and treatment.

## 2015-04-29 ENCOUNTER — Emergency Department (HOSPITAL_COMMUNITY): Payer: Medicare Other

## 2015-04-29 ENCOUNTER — Encounter (HOSPITAL_COMMUNITY): Payer: Self-pay | Admitting: Emergency Medicine

## 2015-04-29 ENCOUNTER — Observation Stay (HOSPITAL_COMMUNITY)
Admission: EM | Admit: 2015-04-29 | Discharge: 2015-04-30 | Disposition: A | Discharge status: Home / Self Care | Payer: Medicare Other | Attending: Internal Medicine | Admitting: Internal Medicine

## 2015-04-29 DIAGNOSIS — Z794 Long term (current) use of insulin: Secondary | ICD-10-CM | POA: Diagnosis not present

## 2015-04-29 DIAGNOSIS — Z8673 Personal history of transient ischemic attack (TIA), and cerebral infarction without residual deficits: Secondary | ICD-10-CM | POA: Diagnosis not present

## 2015-04-29 DIAGNOSIS — I5042 Chronic combined systolic (congestive) and diastolic (congestive) heart failure: Secondary | ICD-10-CM | POA: Diagnosis not present

## 2015-04-29 DIAGNOSIS — J449 Chronic obstructive pulmonary disease, unspecified: Secondary | ICD-10-CM | POA: Diagnosis not present

## 2015-04-29 DIAGNOSIS — Z7982 Long term (current) use of aspirin: Secondary | ICD-10-CM | POA: Diagnosis not present

## 2015-04-29 DIAGNOSIS — N186 End stage renal disease: Secondary | ICD-10-CM | POA: Insufficient documentation

## 2015-04-29 DIAGNOSIS — E1129 Type 2 diabetes mellitus with other diabetic kidney complication: Secondary | ICD-10-CM | POA: Diagnosis not present

## 2015-04-29 DIAGNOSIS — Z7902 Long term (current) use of antithrombotics/antiplatelets: Secondary | ICD-10-CM | POA: Insufficient documentation

## 2015-04-29 DIAGNOSIS — I120 Hypertensive chronic kidney disease with stage 5 chronic kidney disease or end stage renal disease: Secondary | ICD-10-CM | POA: Diagnosis not present

## 2015-04-29 DIAGNOSIS — G4733 Obstructive sleep apnea (adult) (pediatric): Secondary | ICD-10-CM | POA: Diagnosis present

## 2015-04-29 DIAGNOSIS — Z87891 Personal history of nicotine dependence: Secondary | ICD-10-CM | POA: Diagnosis not present

## 2015-04-29 DIAGNOSIS — Z6833 Body mass index (BMI) 33.0-33.9, adult: Secondary | ICD-10-CM | POA: Insufficient documentation

## 2015-04-29 DIAGNOSIS — E1122 Type 2 diabetes mellitus with diabetic chronic kidney disease: Secondary | ICD-10-CM | POA: Diagnosis not present

## 2015-04-29 DIAGNOSIS — E785 Hyperlipidemia, unspecified: Secondary | ICD-10-CM | POA: Diagnosis present

## 2015-04-29 DIAGNOSIS — E669 Obesity, unspecified: Secondary | ICD-10-CM | POA: Insufficient documentation

## 2015-04-29 DIAGNOSIS — R569 Unspecified convulsions: Secondary | ICD-10-CM | POA: Diagnosis not present

## 2015-04-29 DIAGNOSIS — R739 Hyperglycemia, unspecified: Secondary | ICD-10-CM | POA: Diagnosis present

## 2015-04-29 DIAGNOSIS — E11319 Type 2 diabetes mellitus with unspecified diabetic retinopathy without macular edema: Secondary | ICD-10-CM | POA: Diagnosis not present

## 2015-04-29 DIAGNOSIS — Z9689 Presence of other specified functional implants: Secondary | ICD-10-CM | POA: Diagnosis not present

## 2015-04-29 DIAGNOSIS — Z9119 Patient's noncompliance with other medical treatment and regimen: Secondary | ICD-10-CM | POA: Insufficient documentation

## 2015-04-29 DIAGNOSIS — I2510 Atherosclerotic heart disease of native coronary artery without angina pectoris: Secondary | ICD-10-CM | POA: Diagnosis not present

## 2015-04-29 DIAGNOSIS — E1165 Type 2 diabetes mellitus with hyperglycemia: Secondary | ICD-10-CM | POA: Diagnosis not present

## 2015-04-29 DIAGNOSIS — R001 Bradycardia, unspecified: Secondary | ICD-10-CM

## 2015-04-29 DIAGNOSIS — R05 Cough: Secondary | ICD-10-CM | POA: Diagnosis not present

## 2015-04-29 DIAGNOSIS — M109 Gout, unspecified: Secondary | ICD-10-CM | POA: Insufficient documentation

## 2015-04-29 DIAGNOSIS — I5022 Chronic systolic (congestive) heart failure: Secondary | ICD-10-CM | POA: Diagnosis present

## 2015-04-29 DIAGNOSIS — Z79899 Other long term (current) drug therapy: Secondary | ICD-10-CM | POA: Diagnosis not present

## 2015-04-29 DIAGNOSIS — Z992 Dependence on renal dialysis: Secondary | ICD-10-CM | POA: Insufficient documentation

## 2015-04-29 DIAGNOSIS — E1169 Type 2 diabetes mellitus with other specified complication: Secondary | ICD-10-CM | POA: Diagnosis present

## 2015-04-29 LAB — URINE MICROSCOPIC-ADD ON

## 2015-04-29 LAB — BASIC METABOLIC PANEL
Anion gap: 13 (ref 5–15)
BUN: 47 mg/dL — ABNORMAL HIGH (ref 6–20)
CALCIUM: 7.5 mg/dL — AB (ref 8.9–10.3)
CO2: 28 mmol/L (ref 22–32)
Chloride: 87 mmol/L — ABNORMAL LOW (ref 101–111)
Creatinine, Ser: 9.74 mg/dL — ABNORMAL HIGH (ref 0.61–1.24)
GFR calc Af Amer: 6 mL/min — ABNORMAL LOW (ref >60)
GFR, EST NON AFRICAN AMERICAN: 5 mL/min — AB (ref >60)
Glucose, Bld: 662 mg/dL (ref 65–99)
Potassium: 3.8 mmol/L (ref 3.5–5.1)
SODIUM: 128 mmol/L — AB (ref 135–145)

## 2015-04-29 LAB — GLUCOSE, CAPILLARY
GLUCOSE-CAPILLARY: 314 mg/dL — AB (ref 65–99)
Glucose-Capillary: 249 mg/dL — ABNORMAL HIGH (ref 65–99)

## 2015-04-29 LAB — CBG MONITORING, ED
Glucose-Capillary: 497 mg/dL — ABNORMAL HIGH (ref 65–99)
Glucose-Capillary: 467 mg/dL — ABNORMAL HIGH (ref 65–99)

## 2015-04-29 LAB — CBC WITH DIFFERENTIAL/PLATELET
BASOS ABS: 0 10*3/uL (ref 0.0–0.1)
Basophils Relative: 0 % (ref 0–1)
Eosinophils Absolute: 0.2 10*3/uL (ref 0.0–0.7)
Eosinophils Relative: 3 % (ref 0–5)
HCT: 29.3 % — ABNORMAL LOW (ref 39.0–52.0)
HEMOGLOBIN: 10.2 g/dL — AB (ref 13.0–17.0)
LYMPHS ABS: 1.5 10*3/uL (ref 0.7–4.0)
LYMPHS PCT: 22 % (ref 12–46)
MCH: 29.7 pg (ref 26.0–34.0)
MCHC: 34.8 g/dL (ref 30.0–36.0)
MCV: 85.2 fL (ref 78.0–100.0)
MONOS PCT: 8 % (ref 3–12)
Monocytes Absolute: 0.5 10*3/uL (ref 0.1–1.0)
NEUTROS ABS: 4.7 10*3/uL (ref 1.7–7.7)
NEUTROS PCT: 67 % (ref 43–77)
Platelets: 151 10*3/uL (ref 150–400)
RBC: 3.44 MIL/uL — AB (ref 4.22–5.81)
RDW: 14.1 % (ref 11.5–15.5)
WBC: 6.9 10*3/uL (ref 4.0–10.5)

## 2015-04-29 LAB — URINALYSIS, ROUTINE W REFLEX MICROSCOPIC
Bilirubin Urine: NEGATIVE
Glucose, UA: >1000 mg/dL — AB
KETONES UR: NEGATIVE mg/dL
LEUKOCYTES UA: NEGATIVE
Nitrite: NEGATIVE
PH: 7.5 (ref 5.0–8.0)
Protein, ur: >300 mg/dL — AB
SPECIFIC GRAVITY, URINE: 1.016 (ref 1.005–1.030)
Urobilinogen, UA: 0.2 mg/dL (ref 0.0–1.0)

## 2015-04-29 MED ORDER — SIMVASTATIN 20 MG PO TABS
20.0000 mg | ORAL_TABLET | Freq: Every day | ORAL | Status: DC
  Administered 2015-04-29: 20 mg via ORAL
  Filled 2015-04-29: qty 1

## 2015-04-29 MED ORDER — LEVETIRACETAM 500 MG PO TABS
500.0000 mg | ORAL_TABLET | Freq: Two times a day (BID) | ORAL | Status: DC
  Administered 2015-04-29: 500 mg via ORAL
  Filled 2015-04-29: qty 1

## 2015-04-29 MED ORDER — INSULIN ASPART 100 UNIT/ML ~~LOC~~ SOLN
0.0000 [IU] | SUBCUTANEOUS | Status: DC
  Administered 2015-04-29: 9 [IU] via SUBCUTANEOUS
  Administered 2015-04-29: 7 [IU] via SUBCUTANEOUS
  Administered 2015-04-29: 3 [IU] via SUBCUTANEOUS
  Administered 2015-04-30 (×3): 2 [IU] via SUBCUTANEOUS
  Administered 2015-04-30: 3 [IU] via SUBCUTANEOUS
  Filled 2015-04-29: qty 1

## 2015-04-29 MED ORDER — CALCIUM ACETATE (PHOS BINDER) 667 MG PO CAPS
2001.0000 mg | ORAL_CAPSULE | Freq: Three times a day (TID) | ORAL | Status: DC
  Administered 2015-04-29 – 2015-04-30 (×3): 2001 mg via ORAL
  Filled 2015-04-29 (×5): qty 3

## 2015-04-29 MED ORDER — SODIUM CHLORIDE 0.9 % IV BOLUS (SEPSIS)
500.0000 mL | Freq: Once | INTRAVENOUS | Status: AC
  Administered 2015-04-29: 500 mL via INTRAVENOUS

## 2015-04-29 MED ORDER — LORAZEPAM 2 MG/ML IJ SOLN
1.0000 mg | Freq: Once | INTRAMUSCULAR | Status: AC
  Administered 2015-04-29: 1 mg via INTRAVENOUS
  Filled 2015-04-29: qty 1

## 2015-04-29 MED ORDER — COLCHICINE 0.6 MG PO TABS
0.6000 mg | ORAL_TABLET | Freq: Every day | ORAL | Status: DC
  Administered 2015-04-29 – 2015-04-30 (×2): 0.6 mg via ORAL
  Filled 2015-04-29 (×2): qty 1

## 2015-04-29 MED ORDER — CINACALCET HCL 30 MG PO TABS
60.0000 mg | ORAL_TABLET | Freq: Two times a day (BID) | ORAL | Status: DC
  Administered 2015-04-29 – 2015-04-30 (×3): 60 mg via ORAL
  Filled 2015-04-29 (×4): qty 2

## 2015-04-29 MED ORDER — SODIUM CHLORIDE 0.9 % IJ SOLN
3.0000 mL | Freq: Two times a day (BID) | INTRAMUSCULAR | Status: DC
  Administered 2015-04-29 – 2015-04-30 (×2): 3 mL via INTRAVENOUS

## 2015-04-29 MED ORDER — LEVETIRACETAM 750 MG PO TABS
750.0000 mg | ORAL_TABLET | Freq: Two times a day (BID) | ORAL | Status: DC
  Administered 2015-04-29 – 2015-04-30 (×2): 750 mg via ORAL
  Filled 2015-04-29: qty 1

## 2015-04-29 MED ORDER — ASPIRIN EC 81 MG PO TBEC
81.0000 mg | DELAYED_RELEASE_TABLET | Freq: Every day | ORAL | Status: DC
  Administered 2015-04-29 – 2015-04-30 (×2): 81 mg via ORAL
  Filled 2015-04-29 (×2): qty 1

## 2015-04-29 MED ORDER — HEPARIN SODIUM (PORCINE) 5000 UNIT/ML IJ SOLN
5000.0000 [IU] | Freq: Three times a day (TID) | INTRAMUSCULAR | Status: DC
  Administered 2015-04-29 – 2015-04-30 (×4): 5000 [IU] via SUBCUTANEOUS
  Filled 2015-04-29 (×4): qty 1

## 2015-04-29 MED ORDER — LISINOPRIL 20 MG PO TABS
20.0000 mg | ORAL_TABLET | Freq: Every day | ORAL | Status: DC
  Administered 2015-04-29 – 2015-04-30 (×2): 20 mg via ORAL
  Filled 2015-04-29 (×2): qty 1

## 2015-04-29 MED ORDER — SODIUM CHLORIDE 0.9 % IJ SOLN: 3.0000 mL | INTRAMUSCULAR | Status: DC | PRN

## 2015-04-29 MED ORDER — INSULIN ASPART 100 UNIT/ML ~~LOC~~ SOLN
5.0000 [IU] | Freq: Once | SUBCUTANEOUS | Status: AC
  Administered 2015-04-29: 5 [IU] via SUBCUTANEOUS
  Filled 2015-04-29: qty 1

## 2015-04-29 MED ORDER — SODIUM CHLORIDE 0.9 % IV SOLN: 250.0000 mL | INTRAVENOUS | Status: DC | PRN

## 2015-04-29 MED ORDER — ALLOPURINOL 100 MG PO TABS
100.0000 mg | ORAL_TABLET | Freq: Every day | ORAL | Status: DC
  Administered 2015-04-29 – 2015-04-30 (×2): 100 mg via ORAL
  Filled 2015-04-29 (×2): qty 1

## 2015-04-29 MED ORDER — CALCIUM ACETATE 667 MG PO TABS: 2001.0000 | ORAL_TABLET | Freq: Three times a day (TID) | ORAL | Status: DC

## 2015-04-29 MED ORDER — CLOPIDOGREL BISULFATE 75 MG PO TABS
75.0000 mg | ORAL_TABLET | Freq: Every day | ORAL | Status: DC
  Administered 2015-04-29 – 2015-04-30 (×2): 75 mg via ORAL
  Filled 2015-04-29 (×2): qty 1

## 2015-04-29 MED ORDER — RENA-VITE PO TABS
1.0000 | ORAL_TABLET | Freq: Every day | ORAL | Status: DC
  Administered 2015-04-29 – 2015-04-30 (×2): 1 via ORAL
  Filled 2015-04-29 (×2): qty 1

## 2015-04-29 NOTE — H&P (Signed)
Date: 04/29/2015               Patient Name:  Ernest [name] MRN: [id number]  DOB: [birth year] Age / Sex: 69 y.o., male   PCP: No primary care provider on file.         Medical Service: Internal Medicine Teaching Service         Attending Physician: Dr. Madilyn Fireman, MD    First Contact: Dr. Berline Lopes Pager: [phone]  Second Contact: Dr. Natasha Bence Pager: [phone]       After Hours (After 5p/  First Contact Pager: [phone]  weekends / holidays): Second Contact Pager: [phone]   Chief Complaint: seizure  History of Present Illness: KIARI [name] is a 69 year old male with an extensive PMH notable for ESRD on HD (MWF, [place] [facility], Dr Moshe Cipro), Uncontrolled DM (Dr. Chalmers Cater), HTN, Hx CVA, CAD (last cath 2013, no hemodynamically significant stenosis), Chronic Combined CHF (last EF 30-35%, Grade 1 DD in 2014).  Who presented to [facility] for seizures.  Patient currently is lethargic and altered and cannot provide history, history is obtained from wife and chart review. On 7/11 he presented to [facility] with a focal seizure of his right hand than developed into a tonic clonic seizure.  He was treated in the ED and started on Keppra 500mg  twice daily.  In the ED he was noted to be hyperglycemic to 619.  Per his wife he has been compliant with keppra and they followed up with Dr Tomi Likens (neurology) 2 days later, who ordered a MRI of the brain and to continue Keppra 500mg  BID.  However patient's wife reports that last night he had three episodes of right arm shaking that lasted about 1 minute each, he maintain conscienceness during these episodes.   She gave him an extra dose of his keppra as she thought that may help.  He apparently had two more of these seizures so the wife brought him in for evaluation.  In the ED he was apparently given 1mg  of Ativan around 7:50am, although it is not noted if he was having a seizure at that time. He was noted again to be hyperglycemic with a CBG of 662 in the ED.  He  was given a 500cc NS bolus and treated with 5 units of Novolog.  IMTS was asked to admit for ongoing glycemic control and overnight observation.  Of note his Chart reports that he takes Novolin NPH 10u BID, and Novolin R 30u TID, per his wife he was on this regimen many months ago but was told to stop due to hypoglycemia (not sure who told him to stop) he has not been back to Dr Chalmers Cater since February and does not have a PCP (? Dr Moshe Cipro as pcp/ Listed in [facility]).  Of note his was seen in late Feb in the ED and found to have hypoglycemia. She also reports that he has been out of simvastatin and renvela for some time.  Meds: No current facility-administered medications for this encounter.   Current Outpatient Prescriptions  Medication Sig Dispense Refill  . allopurinol (ZYLOPRIM) 100 MG tablet Take 100 mg by mouth daily.     [name] amitriptyline (ELAVIL) 25 MG tablet TAKE ONE TABLET BY MOUTH AT BEDTIME FOR  NEUROPATHY (Patient taking differently: TAKE ONE TABLET BY MOUTH AT BEDTIME AS NEED  FOR  NEUROPATHY) 90 tablet 0  . aspirin EC 81 MG tablet Take 81 mg by mouth daily.    . Calcium  Acetate 667 MG TABS Take 2,001 tablets by mouth 3 (three) times daily with meals.     . cinacalcet (SENSIPAR) 60 MG tablet Take 60 mg by mouth 2 (two) times daily.     . clopidogrel (PLAVIX) 75 MG tablet Take 75 mg by mouth daily.     . colchicine 0.6 MG tablet Take 0.6 mg by mouth daily.     . insulin NPH (HUMULIN N,NOVOLIN N) 100 UNIT/ML injection Inject 10 Units into the skin 2 (two) times daily.     . insulin regular (NOVOLIN R,HUMULIN R) 100 units/mL injection Inject 30 Units into the skin 3 (three) times daily before meals.     . levETIRAcetam (KEPPRA) 500 MG tablet Take 500 mg by mouth 2 (two) times daily.    [name] lidocaine-prilocaine (EMLA) cream Apply 1 application topically as needed (dialysis).    [name] lisinopril (PRINIVIL,ZESTRIL) 20 MG tablet Take 20 mg by mouth daily.    . metoprolol (LOPRESSOR) 50 MG tablet  Take 50 mg by mouth daily.     . multivitamin (RENA-VIT) TABS tablet Take 1 tablet by mouth daily.     . sevelamer (RENVELA) 800 MG tablet Take 800-3,200 mg by mouth 5 (five) times daily. 4 tablets three times daily with meals and 1 tablet twice daily with snacks.    . simvastatin (ZOCOR) 20 MG tablet Take 20 mg by mouth daily at 6 PM.       Allergies: Allergies as of 04/29/2015  . ([name])   Past Medical History  Diagnosis Date  . Hyperlipidemia   . Retinopathy   . Gout   . ESRD (end stage renal disease)     Started HD in [place] in 2009, ESRD was due to DM. Moved to [facility] in Dec 2009 and now gets dialysis at [facility] on a MWF schedule.     . Type II or unspecified type diabetes mellitus without mention of complication, not stated as uncontrolled     adult onset  . Erectile dysfunction     penile implant  . Abnormal stress test     s/p cath November 2013 with modest disease involving the ostial left main, proximal LAD, proximal RCA - do not appear to be hemodynamically signficant; mild LV dysfunction  . Obesity   . Hypertension   . Hepatitis C   . COPD (chronic obstructive pulmonary disease)   . Coronary artery disease     per cath report 2013  . Ejection fraction < 50%   . Chronic systolic CHF (congestive heart failure)   . Bacteremia   . Shortness of breath     Hx: of with exertion  . Gout     Hx: of  . Stroke    Past Surgical History  Procedure Laterality Date  . Penile prosthesis implant    . Fistula      RUE and wrist  . Cardiac catheterization  August 26, 2012  . Eye surgery      laser.  and surgery for DM  . Insertion of dialysis catheter Right 01/01/2013    Procedure: INSERTION OF DIALYSIS CATHETER right  internal jugular;  Surgeon: Serafina Mitchell, MD;  Location: [place];  Service: Vascular;  Laterality: Right;  . Av fistula placement Left 01/13/2013    Procedure: INSERTION OF ARTERIOVENOUS (AV) GORE-TEX GRAFT ARM;  Surgeon: Angelia Mould,  MD;  Location: [place];  Service: Vascular;  Laterality: Left;  . Thrombectomy w/ embolectomy Left 03/26/2013  Procedure: Attempted thrombectomy of left arm arteriovenous goretex graft.;  Surgeon: Angelia Mould, MD;  Location: [place];  Service: Vascular;  Laterality: Left;  . Insertion of dialysis catheter N/A 03/26/2013    Procedure: INSERTION OF DIALYSIS CATHETER Left internal jugular vein;  Surgeon: Angelia Mould, MD;  Location: [place];  Service: Vascular;  Laterality: N/A;  . Avgg removal Left 03/26/2013    Procedure: REMOVAL OF  NON INCORPORATED ARTERIOVENOUS GORETEX GRAFT ([place]) left arm * repair of  left brachial artery with vein patch angioplasty.;  Surgeon: Angelia Mould, MD;  Location: [place];  Service: Vascular;  Laterality: Left;  . Av fistula placement Left 05/05/2013    Procedure: INSERTION OF LEFT UPPER ARM  ARTERIOVENOUS GORTEX GRAFT;  Surgeon: Angelia Mould, MD;  Location: [place];  Service: Vascular;  Laterality: Left;  . Tee without cardioversion N/A 06/11/2013    Procedure: TRANSESOPHAGEAL ECHOCARDIOGRAM (TEE);  Surgeon: Larey Dresser, MD;  Location: [place];  Service: Cardiovascular;  Laterality: N/A;  . Embolectomy Right 08/27/2013    Procedure: EMBOLECTOMY;  Surgeon: Mal Misty, MD;  Location: [place];  Service: Vascular;  Laterality: Right;  Thrombectomy of Radial and ulnar artery.  . Cataract extraction w/ intraocular lens  implant, bilateral    . Colonoscopy    . Ligation of arteriovenous  fistula Right 12/30/2013    Procedure: REMOVAL OF SEGMENT OF GORTEX GRAFT AND FISTULA  AND REPAIR OF BRACHIAL ARTERY;  Surgeon: Mal Misty, MD;  Location: [place];  Service: Vascular;  Laterality: Right;  Annell Greening N/A 04/07/2013    Procedure: VENOGRAM;  Surgeon: Serafina Mitchell, MD;  Location: [facility] CATH LAB;  Service: Cardiovascular;  Laterality: N/A;   Family History  Problem Relation Age of Onset  . Heart disease Father   . Diabetes Sister   .  Diabetes Brother   . Diabetes Son    History   Social History  . Marital Status: Married    Spouse Name: N/A  . Number of Children: N/A  . Years of Education: N/A   Occupational History  . 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 Smoking status: Former Smoker -- 7 years    Quit date: 06/10/1973  . Smokeless tobacco: Never Used  . Alcohol Use: No  . Drug Use: No  . Sexual Activity: No   Other Topics Concern  . Not on file   Social History Narrative   Adopted mentally retarded child at home, 2 adopted children, 3 living and 2 deceased.    Review of Systems: Unable to preform ROS due to altered mental status  Physical Exam: Blood pressure 213/72, pulse 63, temperature 98 F (36.7 C), temperature source Oral, resp. rate 16, SpO2 94 %. Physical Exam  Constitutional: No distress.  Initially patient sleeping, HR in 50s, O2 Sat on room air 88-89%, upon waking patient HR improved to 60s and O2 sat to 96-97% on RA.    HENT:  Head: Normocephalic and atraumatic.  Eyes: Conjunctivae and EOM are normal. Pupils are equal, round, and reactive to light.  Cardiovascular: Regular rhythm.   No murmur heard. bradycardic  Pulmonary/Chest: Effort normal and breath sounds normal. He has no wheezes. He has no rales.  Abdominal: Soft. Bowel sounds are normal. He exhibits no distension. There is no tenderness.  Musculoskeletal: He exhibits edema (1+ below the knee bilaterally).  + bruit in LUE  Neurological: No cranial nerve deficit.  Slurred speech (per wife has this at baseline) Oriented to self, day of week, but not place, date, year.  Had difficulty identifying wife at bedside. No appreciated seizure activity, 4+/5 gross strength in b/l upper extremities and lower extremities, no appreciated rigidity,  Brachial and patella reflexes intact bilaterally.  Nursing note and vitals reviewed.    Lab results: Basic Metabolic Panel:  Recent Labs  04/29/15 0736  NA  128*  K 3.8  CL 87*  CO2 28  GLUCOSE 662*  BUN 47*  CREATININE 9.74*  CALCIUM 7.5*   Liver Function Tests: No results for input(s): AST, ALT, ALKPHOS, BILITOT, PROT, ALBUMIN in the last 72 hours. No results for input(s): LIPASE, AMYLASE in the last 72 hours. No results for input(s): AMMONIA in the last 72 hours. CBC:  Recent Labs  04/29/15 0736  WBC 6.9  NEUTROABS 4.7  HGB 10.2*  HCT 29.3*  MCV 85.2  PLT 151    Imaging results:  Dg Chest 2 View  04/29/2015   CLINICAL DATA:  Cough.  Weakness.  EXAM: CHEST  2 VIEW  COMPARISON:  12/05/2014  FINDINGS: Heart size is normal. Pulmonary interstitial prominence remains stable. Pleural-parenchymal scarring in the left lung base remains stable. No evidence of acute or superimposed pulmonary infiltrate. Stable calcification left superior mediastinum, likely due to calcified left thyroid nodule or old granulomatous disease.  IMPRESSION: Stable diffuse pulmonary interstitial prominence and left basilar pleural scarring. No acute findings.   Electronically Signed   By: Earle Gell M.D.   On: 04/29/2015 07:31   Dg Pelvis Portable  04/29/2015   CLINICAL DATA:  Penile prosthesis  EXAM: PORTABLE PELVIS 1-2 VIEWS  COMPARISON:  None.  FINDINGS: There is no evidence of pelvic fracture or dislocation. Joint spaces appear intact. There is a penile prosthesis with metallic components including metal within a portion of the prosthesis reservoir in the right scrotal region. There are multiple foci of arterial vascular calcification.  IMPRESSION: Penile prosthesis. There are metallic components within the prosthesis and reservoir. No fracture or dislocation. Joint spaces appear intact.   Electronically Signed   By: Lowella Grip III M.D.   On: 04/29/2015 08:13    Other results: EKG: ordered, pending.  Assessment & Plan by Problem:   Seizure - Appears to be contributed to by Hyperglycemia. Neurology has been consulted by EDP. - I suspect the ativan he  was given in the ED/possible post ictal state is contributing to his altered mental status/ lethargy - Will admit overnight for observation and glycemic control.   - Continue Keppra 500mg  BID pending Neuro eval - Will need better ongoing DM control and needs follow up with Dr Chalmers Cater (Endocrinology) - Will try to arrange for patient to establish with a primary care physician. - His Neurologist Dr. Tomi Likens wanted to obtain an MRI, we can likely obtain that here although there is concern that he has a penile implant that contains metal, will need to investigate this further before MRI is ordered.    Uncontrolled type 2 diabetes mellitus with end-stage renal disease, with Hyperglycemia - Patient's wife reports he IS NOT taking any insulin at home due to previous HYPOGLYCEMIA on Novolog NPH 10u BID + Novolog 30u TID.  A1c will not be accurate in ESRD and I will not check today. - Will start with SSI-sensitive on a q4 hour monitoring.  We will be careful not to be overly aggressive with this given that the short acting insulin may last longer than 4  hours.  Once sugars better controlled will change to AC/HS sliding scale and consider adding a long acting insulin. - He will need follow up with his endocrinologist and hopefully a PCP.    Hyperlipidemia - Continue simvastatin, and will consider increasing to more potent statin.    Hypertension - BP 213/72 on admission with seizure, will resume home medications and monitor.    Coronary artery disease -  No active chest pain.  Will continue antiplatelet therapy and simvastatin, unclear at this time why he is not on a high intensity statin.    Chronic combined systolic and diastolic CHF (congestive heart failure) - He has some mild bradycardia on apparently Metoprolol Tartrate 50mg  once a day,  Will check an EKG and hold this for now. - Lungs are CTA, volume management with HD.    Obstructive sleep apnea - Has dx on hospital problem list, during my exam  when he falls asleep he does desaturate to about 89% from about 96-97% while awake.  He is not currently on CPAP her wife. - At [facility] he may need to be referred for sleep study/CPAP titration.  If he desaturates overnight we may empirically place him on CPAP.    End stage renal disease on HD - Consulted Nephrology- Dr Florene Glen for inpatient management of HD. - Patient's wife reports he is unable to afford Renvela, his calcium is wnl. Will check Phos with next labs and continue Calcium acetate for phosphate binding - Hgb at goal   Bradycardia   Dispo: Disposition is deferred at this time, awaiting improvement of current medical problems. Anticipated discharge in approximately 1 day(s).   The patient does not have a current PCP (No primary care provider on file.) and does not know need an [facility] hospital follow-up appointment after discharge.  The patient does not have transportation limitations that hinder transportation to clinic appointments.  Signed: Lucious Groves, DO 04/29/2015, 9:29 AM

## 2015-04-29 NOTE — Consult Note (Signed)
69 year old diabetic male with ESRD at [facility] MWF with a prior CVA and recent new onset seizure diagnosis on 04/25/15 and started on Keppra at that time.  He was admitted this am because of a focal RUE seigure which generalized.   Past Medical History  Diagnosis Date  . Hyperlipidemia   . Retinopathy   . Gout   . ESRD (end stage renal disease)     Started HD in [place] in 2009, ESRD was due to DM. Moved to [facility] in Dec 2009 and now gets dialysis at [facility] on a MWF schedule.     . Type II or unspecified type diabetes mellitus without mention of complication, not stated as uncontrolled     adult onset  . Erectile dysfunction     penile implant  . Abnormal stress test     s/p cath November 2013 with modest disease involving the ostial left main, proximal LAD, proximal RCA - do not appear to be hemodynamically signficant; mild LV dysfunction  . Obesity   . Hypertension   . Hepatitis C   . COPD (chronic obstructive pulmonary disease)   . Coronary artery disease     per cath report 2013  . Ejection fraction < 50%   . Chronic systolic CHF (congestive heart failure)   . Bacteremia   . Shortness of breath     Hx: of with exertion  . Gout     Hx: of  . Stroke    Past Surgical History  Procedure Laterality Date  . Penile prosthesis implant    . Fistula      RUE and wrist  . Cardiac catheterization  August 26, 2012  . Eye surgery      laser.  and surgery for DM  . Insertion of dialysis catheter Right 01/01/2013    Procedure: INSERTION OF DIALYSIS CATHETER right  internal jugular;  Surgeon: Serafina Mitchell, MD;  Location: [place];  Service: Vascular;  Laterality: Right;  . Av fistula placement Left 01/13/2013    Procedure: INSERTION OF ARTERIOVENOUS (AV) GORE-TEX GRAFT ARM;  Surgeon: Angelia Mould, MD;  Location: [place];  Service: Vascular;  Laterality: Left;  . Thrombectomy w/ embolectomy Left 03/26/2013    Procedure: Attempted thrombectomy of left arm arteriovenous goretex  graft.;  Surgeon: Angelia Mould, MD;  Location: [place];  Service: Vascular;  Laterality: Left;  . Insertion of dialysis catheter N/A 03/26/2013    Procedure: INSERTION OF DIALYSIS CATHETER Left internal jugular vein;  Surgeon: Angelia Mould, MD;  Location: [place];  Service: Vascular;  Laterality: N/A;  . Avgg removal Left 03/26/2013    Procedure: REMOVAL OF  NON INCORPORATED ARTERIOVENOUS GORETEX GRAFT ([place]) left arm * repair of  left brachial artery with vein patch angioplasty.;  Surgeon: Angelia Mould, MD;  Location: [place];  Service: Vascular;  Laterality: Left;  . Av fistula placement Left 05/05/2013    Procedure: INSERTION OF LEFT UPPER ARM  ARTERIOVENOUS GORTEX GRAFT;  Surgeon: Angelia Mould, MD;  Location: [place];  Service: Vascular;  Laterality: Left;  . Tee without cardioversion N/A 06/11/2013    Procedure: TRANSESOPHAGEAL ECHOCARDIOGRAM (TEE);  Surgeon: Larey Dresser, MD;  Location: [place];  Service: Cardiovascular;  Laterality: N/A;  . Embolectomy Right 08/27/2013    Procedure: EMBOLECTOMY;  Surgeon: Mal Misty, MD;  Location: [place];  Service: Vascular;  Laterality: Right;  Thrombectomy of Radial and ulnar artery.  . Cataract extraction w/  intraocular lens  implant, bilateral    . Colonoscopy    . Ligation of arteriovenous  fistula Right 12/30/2013    Procedure: REMOVAL OF SEGMENT OF GORTEX GRAFT AND FISTULA  AND REPAIR OF BRACHIAL ARTERY;  Surgeon: Mal Misty, MD;  Location: [place];  Service: Vascular;  Laterality: Right;  Annell Greening N/A 04/07/2013    Procedure: VENOGRAM;  Surgeon: Serafina Mitchell, MD;  Location: [facility] CATH LAB;  Service: Cardiovascular;  Laterality: N/A;   Social History:  reports that he quit smoking about 41 years ago. He has never used smokeless tobacco. He reports that he does not drink alcohol or use illicit drugs. Allergies: [name] Family History  Problem Relation Age of Onset  . Heart disease Father   .  Diabetes Sister   . Diabetes Brother   . Diabetes Son     Medications:  Scheduled: . allopurinol  100 mg Oral Daily  . aspirin EC  81 mg Oral Daily  . calcium acetate  2,001 mg Oral TID WC  . cinacalcet  60 mg Oral BID WC  . clopidogrel  75 mg Oral Daily  . colchicine  0.6 mg Oral Daily  . heparin  5,000 Units Subcutaneous 3 times per day  . insulin aspart  0-9 Units Subcutaneous 6 times per day  . levETIRAcetam  750 mg Oral BID  . lisinopril  20 mg Oral Daily  . multivitamin  1 tablet Oral Daily  . simvastatin  20 mg Oral q1800  . sodium chloride  3 mL Intravenous Q12H   ROS: not relaible as pt lethargic Blood pressure 162/70, pulse 55, temperature 97.5 F (36.4 C), temperature source Oral, resp. rate 16, SpO2 97 %.  General appearance: slowed mentation Head: Normocephalic, without obvious abnormality, atraumatic Throat: lips, mucosa, and tongue normal; teeth and gums normal Resp: clear to auscultation bilaterally Chest wall: no tenderness Cardio: regular rate and rhythm, S1, S2 normal, no murmur, click, rub or gallop GI: obese Extremities: edema 1+ Skin: Skin color, texture, turgor normal. No rashes or lesions Neurologic: dysarthric slurred speech, lethargic Results for orders placed or performed during the hospital encounter of 04/29/15 (from the past 48 hour(s))  Basic metabolic panel     Status: Abnormal   Collection Time: 04/29/15  7:36 AM  Result Value Ref Range   Sodium 128 (L) 135 - 145 mmol/L   Potassium 3.8 3.5 - 5.1 mmol/L   Chloride 87 (L) 101 - 111 mmol/L   CO2 28 22 - 32 mmol/L   Glucose, Bld 662 (HH) 65 - 99 mg/dL    Comment: REPEATED TO VERIFY CRITICAL RESULT CALLED TO, READ BACK BY AND VERIFIED WITH: OMOHUNDRO,J RN @ 6203 04/29/15 LEONARD,A    BUN 47 (H) 6 - 20 mg/dL   Creatinine, Ser 9.74 (H) 0.61 - 1.24 mg/dL   Calcium 7.5 (L) 8.9 - 10.3 mg/dL   GFR calc non Af Amer 5 (L) >60 mL/min   GFR calc Af Amer 6 (L) >60 mL/min    Comment: (NOTE) The  eGFR has been calculated using the CKD EPI equation. This calculation has not been validated in all clinical situations. eGFR's persistently <60 mL/min signify possible Chronic Kidney Disease.    Anion gap 13 5 - 15  CBC with Differential     Status: Abnormal   Collection Time: 04/29/15  7:36 AM  Result Value Ref Range   WBC 6.9 4.0 - 10.5 K/uL   RBC 3.44 (L) 4.22 - 5.81  MIL/uL   Hemoglobin 10.2 (L) 13.0 - 17.0 g/dL   HCT 29.3 (L) 39.0 - 52.0 %   MCV 85.2 78.0 - 100.0 fL   MCH 29.7 26.0 - 34.0 pg   MCHC 34.8 30.0 - 36.0 g/dL   RDW 14.1 11.5 - 15.5 %   Platelets 151 150 - 400 K/uL   Neutrophils Relative % 67 43 - 77 %   Neutro Abs 4.7 1.7 - 7.7 K/uL   Lymphocytes Relative 22 12 - 46 %   Lymphs Abs 1.5 0.7 - 4.0 K/uL   Monocytes Relative 8 3 - 12 %   Monocytes Absolute 0.5 0.1 - 1.0 K/uL   Eosinophils Relative 3 0 - 5 %   Eosinophils Absolute 0.2 0.0 - 0.7 K/uL   Basophils Relative 0 0 - 1 %   Basophils Absolute 0.0 0.0 - 0.1 K/uL  CBG monitoring, ED     Status: Abnormal   Collection Time: 04/29/15 10:20 AM  Result Value Ref Range   Glucose-Capillary 497 (H) 65 - 99 mg/dL  POC CBG, ED     Status: Abnormal   Collection Time: 04/29/15 12:24 PM  Result Value Ref Range   Glucose-Capillary 467 (H) 65 - 99 mg/dL  Glucose, capillary     Status: Abnormal   Collection Time: 04/29/15  3:21 PM  Result Value Ref Range   Glucose-Capillary 314 (H) 65 - 99 mg/dL  Urinalysis, Routine w reflex microscopic (not at [facility])     Status: Abnormal   Collection Time: 04/29/15  5:02 PM  Result Value Ref Range   Color, Urine YELLOW YELLOW   APPearance CLEAR CLEAR   Specific Gravity, Urine 1.016 1.005 - 1.030   pH 7.5 5.0 - 8.0   Glucose, UA >1000 (A) NEGATIVE mg/dL   Hgb urine dipstick SMALL (A) NEGATIVE   Bilirubin Urine NEGATIVE NEGATIVE   Ketones, ur NEGATIVE NEGATIVE mg/dL   Protein, ur >300 (A) NEGATIVE mg/dL   Urobilinogen, UA 0.2 0.0 - 1.0 mg/dL   Nitrite NEGATIVE NEGATIVE    Leukocytes, UA NEGATIVE NEGATIVE  Urine microscopic-add on     Status: Abnormal   Collection Time: 04/29/15  5:02 PM  Result Value Ref Range   Squamous Epithelial / LPF FEW (A) RARE   WBC, UA 7-10 <3 WBC/hpf   RBC / HPF 0-2 <3 RBC/hpf   Bacteria, UA FEW (A) RARE   Dg Chest 2 View  04/29/2015   CLINICAL DATA:  Cough.  Weakness.  EXAM: CHEST  2 VIEW  COMPARISON:  12/05/2014  FINDINGS: Heart size is normal. Pulmonary interstitial prominence remains stable. Pleural-parenchymal scarring in the left lung base remains stable. No evidence of acute or superimposed pulmonary infiltrate. Stable calcification left superior mediastinum, likely due to calcified left thyroid nodule or old granulomatous disease.  IMPRESSION: Stable diffuse pulmonary interstitial prominence and left basilar pleural scarring. No acute findings.   Electronically Signed   By: Earle Gell M.D.   On: 04/29/2015 07:31   Dg Pelvis Portable  04/29/2015   CLINICAL DATA:  Penile prosthesis  EXAM: PORTABLE PELVIS 1-2 VIEWS  COMPARISON:  None.  FINDINGS: There is no evidence of pelvic fracture or dislocation. Joint spaces appear intact. There is a penile prosthesis with metallic components including metal within a portion of the prosthesis reservoir in the right scrotal region. There are multiple foci of arterial vascular calcification.  IMPRESSION: Penile prosthesis. There are metallic components within the prosthesis and reservoir. No fracture or dislocation. Joint spaces appear  intact.   Electronically Signed   By: Lowella Grip III M.D.   On: 04/29/2015 08:13    Assessment:  1 ESRD, HD MWF at [facility] 2 Seizures Plan: 1 Will plan HD in AM(Sat), off schedule. 2. Need to get outpt records for update.  Ernest [name] C 04/29/2015, 7:36 PM

## 2015-04-29 NOTE — ED Notes (Signed)
Iv team at bedside  

## 2015-04-29 NOTE — ED Notes (Signed)
Main lab called with critical Glucose 662

## 2015-04-29 NOTE — Progress Notes (Signed)
Pt received alert, verbal with no noted distress. Pt stable, neuro intact able to move all extremities to ambulate from the stretcher to bed. Pt oriented to room. Safety measures in place. Call bell within reach. Will continue to monitor.

## 2015-04-29 NOTE — ED Notes (Signed)
ED MD/PA made aware of delay with MRI related to questionable metal uimplant

## 2015-04-29 NOTE — ED Provider Notes (Signed)
CSN: [id number]     Arrival date & time 04/29/15  0522 History   First MD Initiated Contact with Patient 04/29/15 [phone]     Chief Complaint  Patient presents with  . Seizures  . Arm Pain     (Consider location/radiation/quality/duration/timing/severity/associated sxs/prior Treatment) HPI   Pt with ESRD, COPD, CAD, DM with new diagnosis of seizures 5 days ago, started on keppra, presents with 5 seizures overnight.  Has been taking his Keppra twice daily as directed.  Wife gave him an extra dose last night when the seizures began.  Seizures involve a clenching, tightening of his right hand that then shakes.  He was seen in ED 04/25/15 when seizures first began, started on Keppra with outpatient neurology appt, seen by Dr Tomi Likens dx with isolated symptomatic partial seizures with secondary generalization, thought either to be from hyperglycemia or more likely hx CVA.  Pt has chronic dysarthria and no other deficits following his stroke.  Pt denies any recent sick symptoms or any complaints.  He does have a dry cough.  He dialyzes MWF, last dialysis Wednesday (2 days ago).     Past Medical History  Diagnosis Date  . Hyperlipidemia   . Retinopathy   . Gout   . ESRD (end stage renal disease)     Started HD in [place] in 2009, ESRD was due to DM. Moved to [facility] in Dec 2009 and now gets dialysis at [facility] on a MWF schedule.     . Type II or unspecified type diabetes mellitus without mention of complication, not stated as uncontrolled     adult onset  . Erectile dysfunction     penile implant  . Abnormal stress test     s/p cath November 2013 with modest disease involving the ostial left main, proximal LAD, proximal RCA - do not appear to be hemodynamically signficant; mild LV dysfunction  . Obesity   . Hypertension   . Hepatitis C   . COPD (chronic obstructive pulmonary disease)   . Coronary artery disease     per cath report 2013  . Ejection fraction < 50%   . Chronic systolic CHF  (congestive heart failure)   . Bacteremia   . Shortness of breath     Hx: of with exertion  . Gout     Hx: of  . Stroke    Past Surgical History  Procedure Laterality Date  . Penile prosthesis implant    . Fistula      RUE and wrist  . Cardiac catheterization  August 26, 2012  . Eye surgery      laser.  and surgery for DM  . Insertion of dialysis catheter Right 01/01/2013    Procedure: INSERTION OF DIALYSIS CATHETER right  internal jugular;  Surgeon: Serafina Mitchell, MD;  Location: [place];  Service: Vascular;  Laterality: Right;  . Av fistula placement Left 01/13/2013    Procedure: INSERTION OF ARTERIOVENOUS (AV) GORE-TEX GRAFT ARM;  Surgeon: Angelia Mould, MD;  Location: [place];  Service: Vascular;  Laterality: Left;  . Thrombectomy w/ embolectomy Left 03/26/2013    Procedure: Attempted thrombectomy of left arm arteriovenous goretex graft.;  Surgeon: Angelia Mould, MD;  Location: [place];  Service: Vascular;  Laterality: Left;  . Insertion of dialysis catheter N/A 03/26/2013    Procedure: INSERTION OF DIALYSIS CATHETER Left internal jugular vein;  Surgeon: Angelia Mould, MD;  Location: [place];  Service: Vascular;  Laterality: N/A;  . Avgg  removal Left 03/26/2013    Procedure: REMOVAL OF  NON INCORPORATED ARTERIOVENOUS GORETEX GRAFT ([place]) left arm * repair of  left brachial artery with vein patch angioplasty.;  Surgeon: Angelia Mould, MD;  Location: [place];  Service: Vascular;  Laterality: Left;  . Av fistula placement Left 05/05/2013    Procedure: INSERTION OF LEFT UPPER ARM  ARTERIOVENOUS GORTEX GRAFT;  Surgeon: Angelia Mould, MD;  Location: [place];  Service: Vascular;  Laterality: Left;  . Tee without cardioversion N/A 06/11/2013    Procedure: TRANSESOPHAGEAL ECHOCARDIOGRAM (TEE);  Surgeon: Larey Dresser, MD;  Location: [place];  Service: Cardiovascular;  Laterality: N/A;  . Embolectomy Right 08/27/2013    Procedure: EMBOLECTOMY;  Surgeon: Mal Misty, MD;  Location: [place];  Service: Vascular;  Laterality: Right;  Thrombectomy of Radial and ulnar artery.  . Cataract extraction w/ intraocular lens  implant, bilateral    . Colonoscopy    . Ligation of arteriovenous  fistula Right 12/30/2013    Procedure: REMOVAL OF SEGMENT OF GORTEX GRAFT AND FISTULA  AND REPAIR OF BRACHIAL ARTERY;  Surgeon: Mal Misty, MD;  Location: [place];  Service: Vascular;  Laterality: Right;  Annell Greening N/A 04/07/2013    Procedure: VENOGRAM;  Surgeon: Serafina Mitchell, MD;  Location: [facility] CATH LAB;  Service: Cardiovascular;  Laterality: N/A;   Family History  Problem Relation Age of Onset  . Heart disease Father   . Diabetes Sister   . Diabetes Brother   . Diabetes Son    History  Substance Use Topics  . Smoking status: Former Smoker -- 7 years    Quit date: 06/10/1973  . Smokeless tobacco: Never Used  . Alcohol Use: No    Review of Systems  All other systems reviewed and are negative.     Allergies  Review of patient's allergies indicates no known allergies.  Home Medications   Prior to Admission medications   Medication Sig Start Date End Date Taking? Authorizing Provider  allopurinol (ZYLOPRIM) 100 MG tablet Take 100 mg by mouth daily.  12/13/14   Historical Provider, MD  amitriptyline (ELAVIL) 25 MG tablet TAKE ONE TABLET BY MOUTH AT BEDTIME FOR  NEUROPATHY Patient taking differently: TAKE ONE TABLET BY MOUTH AT BEDTIME AS NEED  FOR  NEUROPATHY 03/18/14   Bronson Ing, DPM  aspirin EC 81 MG tablet Take 81 mg by mouth daily. 06/18/13   Carlena Bjornstad, MD  Calcium Acetate 667 MG TABS Take 2,001 tablets by mouth 3 (three) times daily with meals.     Historical Provider, MD  cinacalcet (SENSIPAR) 60 MG tablet Take 60 mg by mouth 2 (two) times daily.     Historical Provider, MD  clopidogrel (PLAVIX) 75 MG tablet Take 75 mg by mouth daily.     Historical Provider, MD  colchicine 0.6 MG tablet Take 0.6 mg by mouth daily.  12/13/14   Historical  Provider, MD  insulin NPH (HUMULIN N,NOVOLIN N) 100 UNIT/ML injection Inject 10 Units into the skin 2 (two) times daily.     Historical Provider, MD  insulin regular (NOVOLIN R,HUMULIN R) 100 units/mL injection Inject 30 Units into the skin 3 (three) times daily before meals.     Historical Provider, MD  lidocaine-prilocaine (EMLA) cream Apply 1 application topically as needed (dialysis).    Historical Provider, MD  lisinopril (PRINIVIL,ZESTRIL) 20 MG tablet Take 20 mg by mouth daily.    Historical Provider, MD  metoprolol (LOPRESSOR) 50 MG tablet  Take 50 mg by mouth daily.  01/27/15   Historical Provider, MD  multivitamin (RENA-VIT) TABS tablet Take 1 tablet by mouth daily.     Historical Provider, MD  sevelamer (RENVELA) 800 MG tablet Take 800-3,200 mg by mouth 5 (five) times daily. 4 tablets three times daily with meals and 1 tablet twice daily with snacks.    Historical Provider, MD  simvastatin (ZOCOR) 20 MG tablet Take 20 mg by mouth daily at 6 PM.     Historical Provider, MD   BP 213/72 mmHg  Pulse 63  Temp(Src) 98 F (36.7 C) (Oral)  Resp 16  SpO2 94% Physical Exam  Constitutional: He appears well-developed and well-nourished. No distress.  HENT:  Head: Normocephalic and atraumatic.  Neck: Neck supple.  Cardiovascular: Normal rate and regular rhythm.   Pulmonary/Chest: Effort normal and breath sounds normal. No respiratory distress. He has no wheezes. He has no rales.  Abdominal: Soft. He exhibits no distension and no mass. There is no tenderness. There is no rebound and no guarding.  Musculoskeletal: He exhibits no edema.  Neurological: He is alert. He has normal strength. No sensory deficit. He exhibits normal muscle tone. GCS eye subscore is 4. GCS verbal subscore is 5. GCS motor subscore is 6.  CN III-VII intact with exception of chronically slurred speech   Skin: He is not diaphoretic.  Nursing note and vitals reviewed.   ED Course  Procedures (including critical care  time) Labs Review Labs Reviewed  BASIC METABOLIC PANEL - Abnormal; Notable for the following:    Sodium 128 (*)    Chloride 87 (*)    Glucose, Bld 662 (*)    BUN 47 (*)    Creatinine, Ser 9.74 (*)    Calcium 7.5 (*)    GFR calc non Af Amer 5 (*)    GFR calc Af Amer 6 (*)    All other components within normal limits  CBC WITH DIFFERENTIAL/PLATELET - Abnormal; Notable for the following:    RBC 3.44 (*)    Hemoglobin 10.2 (*)    HCT 29.3 (*)    All other components within normal limits  URINALYSIS, ROUTINE W REFLEX MICROSCOPIC (NOT AT [facility])    Imaging Review Dg Chest 2 View  04/29/2015   CLINICAL DATA:  Cough.  Weakness.  EXAM: CHEST  2 VIEW  COMPARISON:  12/05/2014  FINDINGS: Heart size is normal. Pulmonary interstitial prominence remains stable. Pleural-parenchymal scarring in the left lung base remains stable. No evidence of acute or superimposed pulmonary infiltrate. Stable calcification left superior mediastinum, likely due to calcified left thyroid nodule or old granulomatous disease.  IMPRESSION: Stable diffuse pulmonary interstitial prominence and left basilar pleural scarring. No acute findings.   Electronically Signed   By: Earle Gell M.D.   On: 04/29/2015 07:31   Dg Pelvis Portable  04/29/2015   CLINICAL DATA:  Penile prosthesis  EXAM: PORTABLE PELVIS 1-2 VIEWS  COMPARISON:  None.  FINDINGS: There is no evidence of pelvic fracture or dislocation. Joint spaces appear intact. There is a penile prosthesis with metallic components including metal within a portion of the prosthesis reservoir in the right scrotal region. There are multiple foci of arterial vascular calcification.  IMPRESSION: Penile prosthesis. There are metallic components within the prosthesis and reservoir. No fracture or dislocation. Joint spaces appear intact.   Electronically Signed   By: Lowella Grip III M.D.   On: 04/29/2015 08:13     EKG Interpretation None  8:46 AM Discussed pt with Dr  Doy Mince who will see the patient in consult.    8:50 AM Patient admitted to Internal Medicine Teaching Service as unassigned admission.    MDM   Final diagnoses:  Seizures  Hyperglycemia  ESRD needing dialysis    Pt with hx DM, ESRD on dialysis, CVA, with new diagnosis of seizures, on Keppra, with continued partial seizures overnight x 5.  Pt given ativan, placed on seizure precautions.  Pt had plans for outpatient MRI, which I ordered today; however, pt has metal in penile implant, unclear if able to get the MRI.  Pt also found to have hyperglycemia (662) without DKA.  Will also need routine dialysis.  Pt to be seen by Dr Doy Mince (neurology) in consult, admitted to Internal Medicine Teaching Service.      [place], [name] 04/29/15 [id number]  Everlene Balls, MD 04/29/15 1335

## 2015-04-29 NOTE — ED Notes (Signed)
Pts family at bedside reports "seizures" last one being around 43am today; family describes the activity as left arm shaking/twitching and head twitching; family cannot explain how long each episode lasted; pt sts was seen here this week for same and was given Rx for Keppra; pt sts he takes as prescribed; pt also MWF dialysis patient; pt CAOx4 at this time

## 2015-04-29 NOTE — ED Notes (Signed)
Second RN attempted IV; IV team at bedside.

## 2015-04-29 NOTE — Consult Note (Signed)
NEURO HOSPITALIST CONSULT NOTE   Referring physician: Bhardwaj   Reason for Consult: seizure  HPI:                                                                                                                                          Ernest [name] is an 69 y.o. male with history of stroke and recently diagnosed with seizure. Patient was recently seen by Dr. Tomi Likens his primary neurologist after having multiple seizures and taken to ED.  On 04/25/2015 he suffered 2 seizures. While in ED his BG was noted to be 557.  Patient was placed on Keppra 500 mg BID. Per wife he was doing well until this AM. Patient suffered a break through seizure early this AM which started as right hand clenched in to a fist and progressed to a full TC seizure.  Wife states he had three such seizures prior to EMS coming.  These started as simple partial seizures of right arm but progressed to complex partial of right arm as he was Unable to communicate. He was given Ativan while in ED. Due to penile implant he was unable to have MRI in ED. Currently he is very drowsy from the Ativan. No clinical seizures noted.   Wife states his BG is usually 130-15 however both episodes of seizure activity his BG was elevated in the 600's.  Blood sugars have not bee nwell controlled since first seizure.  Past Medical History  Diagnosis Date  . Hyperlipidemia   . Retinopathy   . Gout   . ESRD (end stage renal disease)     Started HD in [place] in 2009, ESRD was due to DM. Moved to [facility] in Dec 2009 and now gets dialysis at [facility] on a MWF schedule.     . Type II or unspecified type diabetes mellitus without mention of complication, not stated as uncontrolled     adult onset  . Erectile dysfunction     penile implant  . Abnormal stress test     s/p cath November 2013 with modest disease involving the ostial left main, proximal LAD, proximal RCA - do not appear to be hemodynamically signficant; mild LV  dysfunction  . Obesity   . Hypertension   . Hepatitis C   . COPD (chronic obstructive pulmonary disease)   . Coronary artery disease     per cath report 2013  . Ejection fraction < 50%   . Chronic systolic CHF (congestive heart failure)   . Bacteremia   . Shortness of breath     Hx: of with exertion  . Gout     Hx: of  . Stroke     Past Surgical History  Procedure Laterality Date  . Penile prosthesis implant    .  Fistula      RUE and wrist  . Cardiac catheterization  August 26, 2012  . Eye surgery      laser.  and surgery for DM  . Insertion of dialysis catheter Right 01/01/2013    Procedure: INSERTION OF DIALYSIS CATHETER right  internal jugular;  Surgeon: Serafina Mitchell, MD;  Location: [place];  Service: Vascular;  Laterality: Right;  . Av fistula placement Left 01/13/2013    Procedure: INSERTION OF ARTERIOVENOUS (AV) GORE-TEX GRAFT ARM;  Surgeon: Angelia Mould, MD;  Location: [place];  Service: Vascular;  Laterality: Left;  . Thrombectomy w/ embolectomy Left 03/26/2013    Procedure: Attempted thrombectomy of left arm arteriovenous goretex graft.;  Surgeon: Angelia Mould, MD;  Location: [place];  Service: Vascular;  Laterality: Left;  . Insertion of dialysis catheter N/A 03/26/2013    Procedure: INSERTION OF DIALYSIS CATHETER Left internal jugular vein;  Surgeon: Angelia Mould, MD;  Location: [place];  Service: Vascular;  Laterality: N/A;  . Avgg removal Left 03/26/2013    Procedure: REMOVAL OF  NON INCORPORATED ARTERIOVENOUS GORETEX GRAFT ([place]) left arm * repair of  left brachial artery with vein patch angioplasty.;  Surgeon: Angelia Mould, MD;  Location: [place];  Service: Vascular;  Laterality: Left;  . Av fistula placement Left 05/05/2013    Procedure: INSERTION OF LEFT UPPER ARM  ARTERIOVENOUS GORTEX GRAFT;  Surgeon: Angelia Mould, MD;  Location: [place];  Service: Vascular;  Laterality: Left;  . Tee without cardioversion N/A 06/11/2013     Procedure: TRANSESOPHAGEAL ECHOCARDIOGRAM (TEE);  Surgeon: Larey Dresser, MD;  Location: [place];  Service: Cardiovascular;  Laterality: N/A;  . Embolectomy Right 08/27/2013    Procedure: EMBOLECTOMY;  Surgeon: Mal Misty, MD;  Location: [place];  Service: Vascular;  Laterality: Right;  Thrombectomy of Radial and ulnar artery.  . Cataract extraction w/ intraocular lens  implant, bilateral    . Colonoscopy    . Ligation of arteriovenous  fistula Right 12/30/2013    Procedure: REMOVAL OF SEGMENT OF GORTEX GRAFT AND FISTULA  AND REPAIR OF BRACHIAL ARTERY;  Surgeon: Mal Misty, MD;  Location: [place];  Service: Vascular;  Laterality: Right;  Annell Greening N/A 04/07/2013    Procedure: VENOGRAM;  Surgeon: Serafina Mitchell, MD;  Location: [facility] CATH LAB;  Service: Cardiovascular;  Laterality: N/A;    Family History  Problem Relation Age of Onset  . Heart disease Father   . Diabetes Sister   . Diabetes Brother   . Diabetes Son      Social History:  reports that he quit smoking about 41 years ago. He has never used smokeless tobacco. He reports that he does not drink alcohol or use illicit drugs.  [name]  MEDICATIONS:                                                                                                                     Prior to Admission:  Prescriptions prior to admission  Medication Sig Dispense Refill Last Dose  . allopurinol (ZYLOPRIM) 100 MG tablet Take 100 mg by mouth daily.    04/28/2015 at Unknown time  . amitriptyline (ELAVIL) 25 MG tablet TAKE ONE TABLET BY MOUTH AT BEDTIME FOR  NEUROPATHY (Patient taking differently: TAKE ONE TABLET BY MOUTH AT BEDTIME AS NEED  FOR  NEUROPATHY) 90 tablet 0 04/28/2015 at Unknown time  . aspirin EC 81 MG tablet Take 81 mg by mouth daily.   04/28/2015 at Unknown time  . Calcium Acetate 667 MG TABS Take 2,001 tablets by mouth 3 (three) times daily with meals.    04/28/2015 at Unknown time  . cinacalcet (SENSIPAR) 60 MG tablet Take  60 mg by mouth 2 (two) times daily.    04/28/2015 at Unknown time  . clopidogrel (PLAVIX) 75 MG tablet Take 75 mg by mouth daily.    04/28/2015 at Unknown time  . colchicine 0.6 MG tablet Take 0.6 mg by mouth daily.    04/28/2015 at Unknown time  . levETIRAcetam (KEPPRA) 500 MG tablet Take 500 mg by mouth 2 (two) times daily.   04/28/2015 at Unknown time  . lidocaine-prilocaine (EMLA) cream Apply 1 application topically as needed (dialysis).   Past Week at Unknown time  . lisinopril (PRINIVIL,ZESTRIL) 20 MG tablet Take 20 mg by mouth daily.   04/28/2015 at Unknown time  . metoprolol (LOPRESSOR) 50 MG tablet Take 50 mg by mouth daily.    04/28/2015 at 0800  . multivitamin (RENA-VIT) TABS tablet Take 1 tablet by mouth daily.    04/28/2015 at Unknown time  . simvastatin (ZOCOR) 20 MG tablet Take 20 mg by mouth daily at 6 PM.    04/28/2015 at Unknown time  . insulin NPH (HUMULIN N,NOVOLIN N) 100 UNIT/ML injection Inject 10 Units into the skin 2 (two) times daily.    More than a month at Unknown time  . insulin regular (NOVOLIN R,HUMULIN R) 100 units/mL injection Inject 30 Units into the skin 3 (three) times daily before meals.    More than a month at Unknown time  . sevelamer (RENVELA) 800 MG tablet Take 800-3,200 mg by mouth 5 (five) times daily. 4 tablets three times daily with meals and 1 tablet twice daily with snacks.   More than a month at Unknown time   Scheduled: . allopurinol  100 mg Oral Daily  . aspirin EC  81 mg Oral Daily  . calcium acetate  2,001 mg Oral TID WC  . cinacalcet  60 mg Oral BID WC  . clopidogrel  75 mg Oral Daily  . colchicine  0.6 mg Oral Daily  . heparin  5,000 Units Subcutaneous 3 times per day  . insulin aspart  0-9 Units Subcutaneous 6 times per day  . levETIRAcetam  500 mg Oral BID  . lisinopril  20 mg Oral Daily  . multivitamin  1 tablet Oral Daily  . simvastatin  20 mg Oral q1800  . sodium chloride  3 mL Intravenous Q12H     ROS:  History obtained from wife  General ROS: negative for - chills, fatigue, fever, night sweats, weight gain or weight loss Psychological ROS: negative for - behavioral disorder, hallucinations, memory difficulties, mood swings or suicidal ideation Ophthalmic ROS: negative for - blurry vision, double vision, eye pain or loss of vision ENT ROS: negative for - epistaxis, nasal discharge, oral lesions, sore throat, tinnitus or vertigo Allergy and Immunology ROS: negative for - hives or itchy/watery eyes Hematological and Lymphatic ROS: negative for - bleeding problems, bruising or swollen lymph nodes Endocrine ROS: negative for - galactorrhea, hair pattern changes, polydipsia/polyuria or temperature intolerance Respiratory ROS: negative for - cough, hemoptysis, shortness of breath or wheezing Cardiovascular ROS: negative for - chest pain, dyspnea on exertion, edema or irregular heartbeat Gastrointestinal ROS: negative for - abdominal pain, diarrhea, hematemesis, nausea/vomiting or stool incontinence Genito-Urinary ROS: negative for - dysuria, hematuria, incontinence or urinary frequency/urgency Musculoskeletal ROS: negative for - joint swelling or muscular weakness Neurological ROS: as noted in HPI Dermatological ROS: negative for rash and skin lesion changes   Blood pressure 145/116, pulse 56, temperature 98 F (36.7 C), temperature source Oral, resp. rate 13, SpO2 94 %.   Neurologic Examination:                                                                                                      HEENT-  Normocephalic, no lesions, without obvious abnormality.  Normal external eye and conjunctiva.  Normal TM's bilaterally.  Normal auditory canals and external ears. Normal external nose, mucus membranes and septum.  Normal pharynx. Cardiovascular- S1, S2 normal, pulses palpable throughout   Lungs-  chest clear, no wheezing, rales, normal symmetric air entry, Heart exam - S1, S2 normal, no murmur, no gallop, rate regular Abdomen- normal findings: bowel sounds normal Extremities- no edema Lymph-no adenopathy palpable Musculoskeletal-no joint tenderness, deformity or swelling Skin-warm and dry, no hyperpigmentation, vitiligo, or suspicious lesions  Neurological Examination Mental Status: drowsy, oriented to hospital and month.  Speech fluent without evidence of aphasia.  Able to follow 3 step commands without difficulty. Cranial Nerves: II: Discs flat bilaterally; Visual fields grossly normal, pupils equal, round, reactive to light and accommodation III,IV, VI: ptosis not present, extra-ocular motions intact bilaterally V,VII: smile symmetric, facial light touch sensation normal bilaterally VIII: hearing normal bilaterally IX,X: uvula rises symmetrically XI: bilateral shoulder shrug XII: midline tongue extension Motor: Right : Upper extremity   5/5    Left:     Upper extremity   5/5  Lower extremity   5/5     Lower extremity   5/5 Tone and bulk:normal tone throughout; no atrophy noted Sensory: Pinprick and light touch intact throughout, bilaterally Deep Tendon Reflexes: 2+ and symmetric throughout UE with no KJ or AJ Plantars: Mute bilaterally Cerebellar: normal finger-to-nose and normal heel-to-shin test Gait: not tested due to significant drowsiness.       Lab Results: Basic Metabolic Panel:  Recent Labs Lab 04/25/15 0420 04/29/15 0736  NA 135 128*  K 3.8 3.8  CL 93* 87*  CO2 25 28  GLUCOSE 619* 662*  BUN  57* 47*  CREATININE 10.73* 9.74*  CALCIUM 7.8* 7.5*    Liver Function Tests:  Recent Labs Lab 04/25/15 0420  AST 32  ALT 21  ALKPHOS 65  BILITOT 0.4  PROT 6.1*  ALBUMIN 3.3*   No results for input(s): LIPASE, AMYLASE in the last 168 hours. No results for input(s): AMMONIA in the last 168 hours.  CBC:  Recent Labs Lab 04/25/15 0420  04/29/15 0736  WBC 7.2 6.9  NEUTROABS 5.4 4.7  HGB 10.4* 10.2*  HCT 29.5* 29.3*  MCV 85.0 85.2  PLT 169 151    Cardiac Enzymes: No results for input(s): CKTOTAL, CKMB, CKMBINDEX, TROPONINI in the last 168 hours.  Lipid Panel: No results for input(s): CHOL, TRIG, HDL, CHOLHDL, VLDL, LDLCALC in the last 168 hours.  CBG:  Recent Labs Lab 04/25/15 0350 04/25/15 0550 04/29/15 1020 04/29/15 1224  GLUCAP 557* 415* 497* 467*    Microbiology: Results for orders placed or performed in visit on 07/28/13  Culture, blood (single)     Status: None   Collection Time: 07/28/13 11:53 AM  Result Value Ref Range Status   Organism ID, Bacteria NO GROWTH 5 DAYS  Final  Culture, blood (single)     Status: None   Collection Time: 07/28/13 11:53 AM  Result Value Ref Range Status   Organism ID, Bacteria NO GROWTH 5 DAYS  Final    Coagulation Studies: No results for input(s): LABPROT, INR in the last 72 hours.  Imaging: Dg Chest 2 View  04/29/2015   CLINICAL DATA:  Cough.  Weakness.  EXAM: CHEST  2 VIEW  COMPARISON:  12/05/2014  FINDINGS: Heart size is normal. Pulmonary interstitial prominence remains stable. Pleural-parenchymal scarring in the left lung base remains stable. No evidence of acute or superimposed pulmonary infiltrate. Stable calcification left superior mediastinum, likely due to calcified left thyroid nodule or old granulomatous disease.  IMPRESSION: Stable diffuse pulmonary interstitial prominence and left basilar pleural scarring. No acute findings.   Electronically Signed   By: Earle Gell M.D.   On: 04/29/2015 07:31   Dg Pelvis Portable  04/29/2015   CLINICAL DATA:  Penile prosthesis  EXAM: PORTABLE PELVIS 1-2 VIEWS  COMPARISON:  None.  FINDINGS: There is no evidence of pelvic fracture or dislocation. Joint spaces appear intact. There is a penile prosthesis with metallic components including metal within a portion of the prosthesis reservoir in the right scrotal region. There  are multiple foci of arterial vascular calcification.  IMPRESSION: Penile prosthesis. There are metallic components within the prosthesis and reservoir. No fracture or dislocation. Joint spaces appear intact.   Electronically Signed   By: Lowella Grip III M.D.   On: 04/29/2015 08:13     Etta Quill [name] [facility] [phone]  04/29/2015, 2:23 PM  Patient seen and examined.  Clinical course and management discussed.  Necessary edits performed.  I agree with the above.  Assessment and plan of care developed and discussed below.    Assessment/Plan: 69 year old male with breakthrough seizures and hyperglycemia.  Seizures are focal.  Patient with head CT on 7/11 that shows no acute changes.  MRI has not been performed.  Currently patient on Keppra and compliant.    Recommendations: 1.  Patient to have MRI of the brain if penile implant allows.  MRI has been contacted and it depends on the type of implant used.  MD will need to be contacted. 2.  Increase Keppra to 750mg  BID 3.  Seizure precautions   Magda Paganini  Doy Mince, [place] [facility] [phone]  04/29/2015  5:26 PM

## 2015-04-30 DIAGNOSIS — I132 Hypertensive heart and chronic kidney disease with heart failure and with stage 5 chronic kidney disease, or end stage renal disease: Secondary | ICD-10-CM

## 2015-04-30 DIAGNOSIS — R569 Unspecified convulsions: Principal | ICD-10-CM

## 2015-04-30 DIAGNOSIS — E1165 Type 2 diabetes mellitus with hyperglycemia: Secondary | ICD-10-CM | POA: Diagnosis not present

## 2015-04-30 DIAGNOSIS — N186 End stage renal disease: Secondary | ICD-10-CM | POA: Diagnosis not present

## 2015-04-30 DIAGNOSIS — Z87891 Personal history of nicotine dependence: Secondary | ICD-10-CM

## 2015-04-30 DIAGNOSIS — Z79899 Other long term (current) drug therapy: Secondary | ICD-10-CM

## 2015-04-30 DIAGNOSIS — I120 Hypertensive chronic kidney disease with stage 5 chronic kidney disease or end stage renal disease: Secondary | ICD-10-CM | POA: Diagnosis not present

## 2015-04-30 DIAGNOSIS — E785 Hyperlipidemia, unspecified: Secondary | ICD-10-CM

## 2015-04-30 DIAGNOSIS — Z794 Long term (current) use of insulin: Secondary | ICD-10-CM

## 2015-04-30 DIAGNOSIS — Z992 Dependence on renal dialysis: Secondary | ICD-10-CM | POA: Diagnosis not present

## 2015-04-30 DIAGNOSIS — R001 Bradycardia, unspecified: Secondary | ICD-10-CM

## 2015-04-30 DIAGNOSIS — I5042 Chronic combined systolic (congestive) and diastolic (congestive) heart failure: Secondary | ICD-10-CM

## 2015-04-30 DIAGNOSIS — G4733 Obstructive sleep apnea (adult) (pediatric): Secondary | ICD-10-CM

## 2015-04-30 DIAGNOSIS — I2510 Atherosclerotic heart disease of native coronary artery without angina pectoris: Secondary | ICD-10-CM

## 2015-04-30 DIAGNOSIS — E1122 Type 2 diabetes mellitus with diabetic chronic kidney disease: Secondary | ICD-10-CM

## 2015-04-30 LAB — CBC WITH DIFFERENTIAL/PLATELET
Basophils Absolute: 0 10*3/uL (ref 0.0–0.1)
Basophils Relative: 1 % (ref 0–1)
EOS ABS: 0.2 10*3/uL (ref 0.0–0.7)
EOS PCT: 3 % (ref 0–5)
HEMATOCRIT: 28.9 % — AB (ref 39.0–52.0)
HEMOGLOBIN: 10 g/dL — AB (ref 13.0–17.0)
Lymphocytes Relative: 27 % (ref 12–46)
Lymphs Abs: 1.9 10*3/uL (ref 0.7–4.0)
MCH: 28.7 pg (ref 26.0–34.0)
MCHC: 34.6 g/dL (ref 30.0–36.0)
MCV: 82.8 fL (ref 78.0–100.0)
MONOS PCT: 7 % (ref 3–12)
Monocytes Absolute: 0.5 10*3/uL (ref 0.1–1.0)
NEUTROS ABS: 4.2 10*3/uL (ref 1.7–7.7)
Neutrophils Relative %: 62 % (ref 43–77)
Platelets: 188 10*3/uL (ref 150–400)
RBC: 3.49 MIL/uL — AB (ref 4.22–5.81)
RDW: 14 % (ref 11.5–15.5)
WBC: 6.8 10*3/uL (ref 4.0–10.5)

## 2015-04-30 LAB — RENAL FUNCTION PANEL
ALBUMIN: 2.7 g/dL — AB (ref 3.5–5.0)
ANION GAP: 14 (ref 5–15)
BUN: 55 mg/dL — ABNORMAL HIGH (ref 6–20)
CO2: 26 mmol/L (ref 22–32)
Calcium: 7.6 mg/dL — ABNORMAL LOW (ref 8.9–10.3)
Chloride: 95 mmol/L — ABNORMAL LOW (ref 101–111)
Creatinine, Ser: 10.97 mg/dL — ABNORMAL HIGH (ref 0.61–1.24)
GFR calc Af Amer: 5 mL/min — ABNORMAL LOW (ref >60)
GFR calc non Af Amer: 4 mL/min — ABNORMAL LOW (ref >60)
Glucose, Bld: 137 mg/dL — ABNORMAL HIGH (ref 65–99)
POTASSIUM: 4 mmol/L (ref 3.5–5.1)
Phosphorus: 6.5 mg/dL — ABNORMAL HIGH (ref 2.5–4.6)
Sodium: 135 mmol/L (ref 135–145)

## 2015-04-30 LAB — GLUCOSE, CAPILLARY
GLUCOSE-CAPILLARY: 161 mg/dL — AB (ref 65–99)
GLUCOSE-CAPILLARY: 160 mg/dL — AB (ref 65–99)
Glucose-Capillary: 116 mg/dL — ABNORMAL HIGH (ref 65–99)
Glucose-Capillary: 158 mg/dL — ABNORMAL HIGH (ref 65–99)
Glucose-Capillary: 206 mg/dL — ABNORMAL HIGH (ref 65–99)

## 2015-04-30 MED ORDER — INSULIN NPH (HUMAN) (ISOPHANE) 100 UNIT/ML ~~LOC~~ SUSP
10.0000 [IU] | Freq: Two times a day (BID) | SUBCUTANEOUS | Status: DC
  Filled 2015-04-30: qty 10

## 2015-04-30 MED ORDER — INSULIN NPH (HUMAN) (ISOPHANE) 100 UNIT/ML ~~LOC~~ SUSP: 10.0000 [IU] | Freq: Two times a day (BID) | SUBCUTANEOUS | Status: DC

## 2015-04-30 MED ORDER — LEVETIRACETAM 500 MG PO TABS
500.0000 mg | ORAL_TABLET | Freq: Once | ORAL | Status: DC
  Filled 2015-04-30: qty 1

## 2015-04-30 MED ORDER — LEVETIRACETAM 500 MG PO TABS: 1000.0000 mg | ORAL_TABLET | Freq: Every day | ORAL | Status: DC

## 2015-04-30 MED ORDER — LEVETIRACETAM 500 MG PO TABS
1000.0000 mg | ORAL_TABLET | Freq: Every day | ORAL | Status: DC
  Filled 2015-04-30: qty 2

## 2015-04-30 NOTE — Progress Notes (Signed)
Subjective: No further seizures.   Exam: Filed Vitals:   04/30/15 0510  BP: 155/59  Pulse:   Temp: 96.8 F (36 C)  Resp:    Gen: In bed, NAD Abd: NT, ND MS: awake, alert, oriented PA:[id number], VFF Motor: MAEW Sensory:intact to LT  Pertinent Labs: Markedly elevated blood glucose on admission (662)  Impression: 69 yo M with ESRD. I would favor changing his keppra dosing to reflect his renal status. Hyperglycemia alone can cause partial seizures, though usually by the time this happens mental status changes are present as well. I would favor changing his keppra dosing in response to this episode, but will also need better glucose control.   Recommendations: 1) Keppra 1 gm daily with additional 500mg  after dialysis.  2) No further recs at this time, neurology to sign off.   Roland Rack, MD [facility] [phone]  If 7pm- 7am, please page neurology on call as listed in [place].

## 2015-04-30 NOTE — Progress Notes (Signed)
Subjective: Patient was seen and examined at HD today. He is AAOx3 and denies having any complaints. No HA, seizures, or LOC. No CP, SOB, or abdominal pain.   Objective: Vital signs in last 24 hours: Filed Vitals:   04/29/15 1636 04/29/15 2111 04/30/15 0159 04/30/15 0510  BP: 162/70 185/66 144/56 155/59  Pulse: 55 62 60   Temp: 97.5 F (36.4 C) 98.3 F (36.8 C) 98.6 F (37 C) 96.8 F (36 C)  TempSrc: Oral Oral Oral Oral  Resp: 16 16 16    Height:  5\' 9"  (1.753 m)    Weight:  230 lb 9.6 oz (104.599 kg)    SpO2: 97% 97% 99% 98%   Weight change:   Intake/Output Summary (Last 24 hours) at 04/30/15 0909 Last data filed at 04/29/15 2200  Gross per 24 hour  Intake    243 ml  Output      0 ml  Net    243 ml   Physical Exam  Constitutional: No distress.  HENT:  Head: Normocephalic and atraumatic.  Eyes: Conjunctivae and EOM are normal. Pupils are equal, round, and reactive to light.  Cardiovascular: Regular rate and rhythm.S1 and S2 appreciated. No rubs, murmurs, or gallops.  Pulmonary/Chest: Effort normal and breath sounds normal. He has no wheezes. He has no rales.  Abdominal: Soft. Bowel sounds are normal. He exhibits no distension. There is no tenderness.  Musculoskeletal: He exhibits edema (1+ below the knee bilaterally). + bruit in LUE  Neurological: A&O x3, Strength is normal and symmetric bilaterally, cranial nerve II-XII are grossly intact, no focal motor deficit, sensory intact to light touch bilaterally.    Lab Results: Basic Metabolic Panel:  Recent Labs Lab 04/29/15 0736 04/30/15 0801  NA 128* 135  K 3.8 4.0  CL 87* 95*  CO2 28 26  GLUCOSE 662* 137*  BUN 47* 55*  CREATININE 9.74* 10.97*  CALCIUM 7.5* 7.6*  PHOS  --  6.5*   Liver Function Tests:  Recent Labs Lab 04/25/15 0420 04/30/15 0801  AST 32  --   ALT 21  --   ALKPHOS 65  --   BILITOT 0.4  --   PROT 6.1*  --   ALBUMIN 3.3* 2.7*   CBC:  Recent Labs Lab 04/25/15 0420  04/29/15 0736  WBC 7.2 6.9  NEUTROABS 5.4 4.7  HGB 10.4* 10.2*  HCT 29.5* 29.3*  MCV 85.0 85.2  PLT 169 151   CBG:  Recent Labs Lab 04/29/15 1224 04/29/15 1521 04/29/15 2023 04/30/15 0025 04/30/15 0344 04/30/15 0739  GLUCAP 467* 314* 249* 206* 161* 116*   Urinalysis:  Recent Labs Lab 04/29/15 1702  COLORURINE YELLOW  LABSPEC 1.016  PHURINE 7.5  GLUCOSEU >1000*  HGBUR SMALL*  BILIRUBINUR NEGATIVE  KETONESUR NEGATIVE  PROTEINUR >300*  UROBILINOGEN 0.2  NITRITE NEGATIVE  LEUKOCYTESUR NEGATIVE    Micro Results: No results found for this or any previous visit (from the past 240 hour(s)). Studies/Results: Dg Chest 2 View  04/29/2015   CLINICAL DATA:  Cough.  Weakness.  EXAM: CHEST  2 VIEW  COMPARISON:  12/05/2014  FINDINGS: Heart size is normal. Pulmonary interstitial prominence remains stable. Pleural-parenchymal scarring in the left lung base remains stable. No evidence of acute or superimposed pulmonary infiltrate. Stable calcification left superior mediastinum, likely due to calcified left thyroid nodule or old granulomatous disease.  IMPRESSION: Stable diffuse pulmonary interstitial prominence and left basilar pleural scarring. No acute findings.   Electronically Signed   By: Earle Gell  M.D.   On: 04/29/2015 07:31   Dg Pelvis Portable  04/29/2015   CLINICAL DATA:  Penile prosthesis  EXAM: PORTABLE PELVIS 1-2 VIEWS  COMPARISON:  None.  FINDINGS: There is no evidence of pelvic fracture or dislocation. Joint spaces appear intact. There is a penile prosthesis with metallic components including metal within a portion of the prosthesis reservoir in the right scrotal region. There are multiple foci of arterial vascular calcification.  IMPRESSION: Penile prosthesis. There are metallic components within the prosthesis and reservoir. No fracture or dislocation. Joint spaces appear intact.   Electronically Signed   By: Lowella Grip III M.D.   On: 04/29/2015 08:13    Medications: I have reviewed the patient's current medications. Scheduled Meds: . allopurinol  100 mg Oral Daily  . aspirin EC  81 mg Oral Daily  . calcium acetate  2,001 mg Oral TID WC  . cinacalcet  60 mg Oral BID WC  . clopidogrel  75 mg Oral Daily  . colchicine  0.6 mg Oral Daily  . heparin  5,000 Units Subcutaneous 3 times per day  . insulin aspart  0-9 Units Subcutaneous 6 times per day  . levETIRAcetam  750 mg Oral BID  . lisinopril  20 mg Oral Daily  . multivitamin  1 tablet Oral Daily  . simvastatin  20 mg Oral q1800  . sodium chloride  3 mL Intravenous Q12H   Continuous Infusions:  PRN Meds:.sodium chloride, sodium chloride Assessment/Plan: Principal Problem:   Seizure Active Problems:   Uncontrolled type 2 diabetes mellitus with end-stage renal disease   Hyperlipidemia   Hypertension   Coronary artery disease   Chronic combined systolic and diastolic CHF (congestive heart failure)   Obstructive sleep apnea   End stage renal disease   Bradycardia   Hyperglycemia  69 year old male with an extensive PMH notable for ESRD on HD (MWF, [place] [facility], Dr Moshe Cipro), Uncontrolled DM (Dr. Chalmers Cater), HTN, Hx CVA, CAD (last cath 2013, no hemodynamically significant stenosis), Chronic Combined CHF (last EF 30-35%, Grade 1 DD in 2014) presented to [facility] for seizures and AMS.   Seizure Most likely 2/2 hyperglycemia. Pts blood sugar was 662 yesterday. AMS and lethargy likely 2/2 post-ictal state. Pt doing well today. He is AAOx3 and has not had any more episodes of seizures. He was on SSI-sensitive on Q4 monitoring and his blood glucose is 161 today. - As per neuro recommendation, Keppra dose increased to 1g QD + 500mg  on MWF after dialysis. Discussed change in medication dose with the patient and he understands.   - Will need better ongoing DM control and needs follow up with Dr Chalmers Cater (Endocrinology) - Established care with [facility] as primary care - Spoke to Dr. Leonel Ramsay from  neuro today and he suggested holding off MRI for now in the setting of penile metal prosthesis.    Uncontrolled type 2 diabetes mellitus with end-stage renal disease, with Hyperglycemia He is on Novolog NPH 10u BID + Novolog 30u TID at home.Patient has not been taking any insulin at home due to previous hypoglycemia. His blood sugar upon admission was 662. He was started on SSI-sensitive on Q4 monitoring. Blood glucose is 161 today. -Hold SSI and start pt on basal NPH insulin 10mg  BID - He will need follow up with his endocrinologist Dr. Chalmers Cater   Hyperlipidemia - Continue simvastatin, and will consider increasing to more potent statin.   Hypertension BP 213/72 on admission with seizure.BP today is 125/55.  -cont home med  Lisinopril.    Coronary artery disease - No active chest pain. Will continue antiplatelet therapy and simvastatin, unclear at this time why he is not on a high intensity statin.   Chronic combined systolic and diastolic CHF (congestive heart failure) - Lungs are CTA, volume management with HD.   Obstructive sleep apnea Patient has been saturating between 92-100% since yesterday.  -consider outpatient sleep study/CPAP titration.  - If he desaturates, consider placing him on CPAP.   End stage renal disease on HD Pts Cr is 10.97 with a baseline of 9. Hgb 10.2 with a baseline of 12.  - Consulted Nephrology- Dr Florene Glen for inpatient management of HD. - Continue Calcium acetate for phosphate binding -Received HD today    Dispo: Disposition is deferred at this time, awaiting improvement of current medical problems.  Anticipated discharge in approximately 1-2 day(s).   The patient does have a current PCP (No primary care provider on file.) and does need an [facility] hospital follow-up appointment after discharge.  The patient does not have transportation limitations that hinder transportation to clinic appointments.  .Services Needed at time of discharge: Y = Yes,  Blank = No PT:   OT:   RN:   Equipment:   Other:       Shela Leff, MD 04/30/2015, 9:09 AM

## 2015-04-30 NOTE — Discharge Summary (Signed)
Name: Ernest [name] MRN: [id number] DOB: [birth year] 69 y.o. PCP: No primary care provider on file.  Date of Admission: 04/29/2015  5:22 AM Date of Discharge: 05/01/2015 Attending Physician: No att. providers found  Discharge Diagnosis:  Principal Problem:   Seizure Active Problems:   Uncontrolled type 2 diabetes mellitus with end-stage renal disease   Hyperlipidemia   Hypertension   Coronary artery disease   Chronic combined systolic and diastolic CHF (congestive heart failure)   Obstructive sleep apnea   End stage renal disease   Bradycardia   Hyperglycemia  Discharge Medications:   Medication List    STOP taking these medications        insulin regular 100 units/mL injection  Commonly known as:  NOVOLIN R,HUMULIN R      TAKE these medications        allopurinol 100 MG tablet  Commonly known as:  ZYLOPRIM  Take 100 mg by mouth daily.     amitriptyline 25 MG tablet  Commonly known as:  ELAVIL  TAKE ONE TABLET BY MOUTH AT BEDTIME FOR  NEUROPATHY     aspirin EC 81 MG tablet  Take 81 mg by mouth daily.     Calcium Acetate 667 MG Tabs  Take 2,001 tablets by mouth 3 (three) times daily with meals.     cinacalcet 60 MG tablet  Commonly known as:  SENSIPAR  Take 60 mg by mouth 2 (two) times daily.     clopidogrel 75 MG tablet  Commonly known as:  PLAVIX  Take 75 mg by mouth daily.     colchicine 0.6 MG tablet  Take 0.6 mg by mouth daily.     insulin NPH Human 100 UNIT/ML injection  Commonly known as:  HUMULIN N,NOVOLIN N  Inject 0.1 mLs (10 Units total) into the skin 2 (two) times daily.     levETIRAcetam 500 MG tablet  Commonly known as:  KEPPRA  Take 2 tablets (1,000 mg total) by mouth daily. Then take extra 500 mg (1 tablet) on MWF after HD     lidocaine-prilocaine cream  Commonly known as:  EMLA  Apply 1 application topically as needed (dialysis).     lisinopril 20 MG tablet  Commonly known as:  PRINIVIL,ZESTRIL  Take 20 mg by mouth daily.     metoprolol 50 MG tablet  Commonly known as:  LOPRESSOR  Take 50 mg by mouth daily.     multivitamin Tabs tablet  Take 1 tablet by mouth daily.     sevelamer carbonate 800 MG tablet  Commonly known as:  RENVELA  Take 800-3,200 mg by mouth 5 (five) times daily. 4 tablets three times daily with meals and 1 tablet twice daily with snacks.     simvastatin 20 MG tablet  Commonly known as:  ZOCOR  Take 20 mg by mouth daily at 6 PM.        Disposition and follow-up:   Ernest [name] was discharged from [facility] in Stable condition.  At the hospital follow up visit please address:  Seizure Any episodes of seizures since discharge? Is he taking Keppra as prescribed?  Uncontrolled DM Does he check his blood glucose daily? Is he taking NPH insulin as prescribed? When was his last eye exam and foot exam?  2.  Labs / imaging needed at time of follow-up: CBG, CBC, BMP, Lipid panel (consider high intensity statin?)  3.  Pending labs/ test needing follow-up: None  Follow-up Appointments: Follow-up Information  Follow up with Jacelyn Pi, MD. Schedule an appointment as soon as possible for a visit in 1 week.   Specialty:  Endocrinology   Why:  Please call and make an appointment within 1 week.    Contact information:   [address] [place] [id number] [phone]       Follow up with [place] In 1 week.   Why:  The clinic will call you this week with an appointment   Contact information:   [address] [place] [id number]      Discharge Instructions:   Consultations: Treatment Team:  Estanislado Emms, MD  Procedures Performed:  Dg Chest 2 View  04/29/2015   CLINICAL DATA:  Cough.  Weakness.  EXAM: CHEST  2 VIEW  COMPARISON:  12/05/2014  FINDINGS: Heart size is normal. Pulmonary interstitial prominence remains stable. Pleural-parenchymal scarring in the left lung base  remains stable. No evidence of acute or superimposed pulmonary infiltrate. Stable calcification left superior mediastinum, likely due to calcified left thyroid nodule or old granulomatous disease.  IMPRESSION: Stable diffuse pulmonary interstitial prominence and left basilar pleural scarring. No acute findings.   Electronically Signed   By: Earle Gell M.D.   On: 04/29/2015 07:31   Ct Head Wo Contrast  04/25/2015   CLINICAL DATA:  Episode of RIGHT hand cramping/focal seizure, now resolved. History of end-stage renal disease.  EXAM: CT HEAD WITHOUT CONTRAST  TECHNIQUE: Contiguous axial images were obtained from the base of the skull through the vertex without intravenous contrast.  COMPARISON:  CT head September 21, 2014  FINDINGS: Moderate ventriculomegaly, likely on the basis of global parenchymal brain volume loss as there is overall commensurate enlargement of cerebral sulci and cerebellar folia. Small old RIGHT inferior cerebellar infarcts. Patchy white matter changes, similar to prior examination, including pontine involvement. No intraparenchymal hemorrhage, mass effect, midline shift or acute large vascular territory infarct. Linear densities in the bifrontal lobes may reflect vessels , were present previously.  No abnormal extra-axial fluid collections. Basal cisterns are patent. Severe calcific atherosclerosis of the carotid siphons.  No skull fracture. The included ocular globes and orbital contents are non-suspicious. Status post bilateral ocular lens implants. Paranasal sinuses are well aerated. Trace bilateral mastoid effusions. Extensive small vessel calcifications in the scalp.  IMPRESSION: No acute intracranial process.  Chronic changes including moderate brain atrophy, advanced for age though stable from prior imaging. Moderate white matter changes compatible with chronic small vessel ischemic disease.   Electronically Signed   By: Elon Alas M.D.   On: 04/25/2015 04:45   Dg Pelvis  Portable  04/29/2015   CLINICAL DATA:  Penile prosthesis  EXAM: PORTABLE PELVIS 1-2 VIEWS  COMPARISON:  None.  FINDINGS: There is no evidence of pelvic fracture or dislocation. Joint spaces appear intact. There is a penile prosthesis with metallic components including metal within a portion of the prosthesis reservoir in the right scrotal region. There are multiple foci of arterial vascular calcification.  IMPRESSION: Penile prosthesis. There are metallic components within the prosthesis and reservoir. No fracture or dislocation. Joint spaces appear intact.   Electronically Signed   By: Lowella Grip III M.D.   On: 04/29/2015 08:13   Admission HPI: Ernest [name] is a 69 year old male with an extensive PMH notable for ESRD on HD (MWF, [place] [facility], Dr Moshe Cipro), Uncontrolled DM (Dr. Chalmers Cater), HTN, Hx CVA, CAD (last cath 2013, no hemodynamically significant stenosis), Chronic Combined CHF (  last EF 30-35%, Grade 1 DD in 2014). Who presented to [facility] for seizures. Patient currently is lethargic and altered and cannot provide history, history is obtained from wife and chart review. On 7/11 he presented to [facility] with a focal seizure of his right hand than developed into a tonic clonic seizure. He was treated in the ED and started on Keppra 500mg  twice daily. In the ED he was noted to be hyperglycemic to 619. Per his wife he has been compliant with keppra and they followed up with Dr Tomi Likens (neurology) 2 days later, who ordered a MRI of the brain and to continue Keppra 500mg  BID. However patient's wife reports that last night he had three episodes of right arm shaking that lasted about 1 minute each, he maintain conscienceness during these episodes. She gave him an extra dose of his keppra as she thought that may help. He apparently had two more of these seizures so the wife brought him in for evaluation. In the ED he was apparently given 1mg  of Ativan around 7:50am, although it is not noted if he was having  a seizure at that time. He was noted again to be hyperglycemic with a CBG of 662 in the ED. He was given a 500cc NS bolus and treated with 5 units of Novolog. IMTS was asked to admit for ongoing glycemic control and overnight observation.  Of note his Chart reports that he takes Novolin NPH 10u BID, and Novolin R 30u TID, per his wife he was on this regimen many months ago but was told to stop due to hypoglycemia (not sure who told him to stop) he has not been back to Dr Chalmers Cater since February and does not have a PCP (? Dr Moshe Cipro as pcp/ Listed in [facility]). Of note his was seen in late Feb in the ED and found to have hypoglycemia. She also reports that he has been out of simvastatin and renvela for some time.  Hospital Course by problem list: Principal Problem:   Seizure Active Problems:   Uncontrolled type 2 diabetes mellitus with end-stage renal disease   Hyperlipidemia   Hypertension   Coronary artery disease   Chronic combined systolic and diastolic CHF (congestive heart failure)   Obstructive sleep apnea   End stage renal disease   Bradycardia   Hyperglycemia  Seizure Seizure Most likely 2/2 hyperglycemia. BMP indicated a blood glucose was 662 and CBG 497 upon admission. CT did not show any acute intracranial process. AMS and lethargy likely 2/2 post-ictal state. Pt admitted overnight for observation and glycemic control. He was initially given his home dose of Keppra 500 mg BID, which was then increased to 750 mg BID as per neurology recomendation. On 04/30/2015, pt was AAOx3 and had not had any more episodes of seizures. He was on SSI-sensitive on Q4 monitoring and his blood glucose was 161 on 7/16. As per neurology recommendation, Keppra dose increased and he was discharged home with Keppra 1g QD + 500mg  on MWF after dialysis. I discussed change in medication dose with the patient and he understands.Due to pts histroy of seizures, prior to this hospitalization, his outpatient  neurologist Dr. Tomi Likens wanted to obtain an MRI. However, there was concern because patient has a penile implant that contains metal. On 7/16, I spoke to Dr. Leonel Ramsay from neuro and he suggested holding off MRI for now in the setting of penile metal prosthesis. Pt to follow up with Neurology and Endocrinology as outpatient. Also he will be following up  with [facility] within 1 week of discharge.    Uncontrolled type 2 diabetes mellitus Patient's wife reported he was not taking any insulin at home due to previous hypoglycemia on Novolog NPH 10u BID + Novolog 30u TID.BMP indicated a blood glucose was 662 and CBG 497 upon admission. A1c was not checked because it will not be accurate in ESRD. Pt started on SSI-sensitive on a q4 hour monitoring.CBG 160 on 7/16. SSI was held and pt started on basal NPH insulin 10 mg BID on 7/16, discharged home with this dose. Pt to follow up with his endocrinologist Dr. Chalmers Cater for ongoing DM control. Also, he will be following up with [facility] within 1 week.   Hyperlipidemia Continued home med simvastatin  Hypertension BP 213/72 on admission with seizure. We resumed home med Lisnopril and monitored. BP 125/55 on 7/16.   Coronary artery disease No active chest pain. We continued antiplatelet therapy and simvastatin, unclear at this time why he was not on a high intensity statin. Pt to follow up with [facility] within 1 week.   Chronic combined systolic and diastolic CHF (congestive heart failure) Lungs CTA and no SOB. Volume management with HD.  Obstructive sleep apnea On hospital problem list. He might need an outpatient sleep study/CPAP titration in the future.  Patient has been saturating between 92-100% here.   End stage renal disease on HD Pts Cr is 10.97 with a baseline of 9. Hgb 10.2 with a baseline of 12. We consulted Nephrology- Dr Florene Glen for inpatient management of HD and continued Calcium acetate for phosphate binding. He received HD on 7/16.   Discharge Vitals:    BP 144/57 mmHg  Pulse 57  Temp(Src) 97.4 F (36.3 C) (Oral)  Resp 20  Ht 5\' 9"  (1.753 m)  Wt 225 lb 8.5 oz (102.3 kg)  BMI 33.29 kg/m2  SpO2 98%  Discharge Labs:  No results found for this or any previous visit (from the past 24 hour(s)).  Signed: Shela Leff, MD 05/01/2015, 8:34 PM    Services Ordered on Discharge: None Equipment Ordered on Discharge: None

## 2015-04-30 NOTE — Discharge Instructions (Signed)
1. You have a follow up appointment as follows:  [place]  The clinic will call you this week with an appointment  [address] [place] [phone]  2. Please take all medications as previously prescribed with the following changes:  Increase Keppra to 1000 mg (2 tablets in the AM) DAILY. On dialysis days take an extra 500 mg (1 tablet) daily AFTER HD.   Start taking NPH Insulin 10 units twice daily. DO NOT TAKE OTHER INSULIN.   Please check your blood sugars 1-3 times daily. BRING YOUR METER TO CLINIC.   Resume normal HD schedule on Monday.   3. If you have worsening of your symptoms or new symptoms arise, please call the clinic [phone] or go to the ER immediately if symptoms are severe.    Seizure, Adult A seizure means there is unusual activity in the brain. A seizure can cause changes in attention or behavior. Seizures often cause shaking (convulsions). Seizures often last from 30 seconds to 2 minutes. HOME CARE   If you are given medicines, take them exactly as told by your doctor.  Keep all doctor visits as told.  Do not swim or drive until your doctor says it is okay.  Teach others what to do if you have a seizure. They should:  Lay you on the ground.  Put a cushion under your head.  Loosen any tight clothing around your neck.  Turn you on your side.  Stay with you until you get better. GET HELP RIGHT AWAY IF:   The seizure lasts longer than 2 to 5 minutes.  The seizure is very bad.  The person does not wake up after the seizure.  The person's attention or behavior changes. Drive the person to the emergency room or call your local emergency services (911 in [place] MAKE SURE YOU:   Understand these instructions.  Will watch your condition.  Will get help right away if you are not doing well or get worse. Document Released: 03/19/2008 Document Revised: 12/24/2011 Document Reviewed:  09/19/2011 [facility] Patient Information 2015 [place], [place]. This information is not intended to replace advice given to you by your health care provider. Make sure you discuss any questions you have with your health care provider.   Blood Glucose Monitoring Monitoring your blood glucose (also know as blood sugar) helps you to manage your diabetes. It also helps you and your health care provider monitor your diabetes and determine how well your treatment plan is working. WHY SHOULD YOU MONITOR YOUR BLOOD GLUCOSE?  It can help you understand how food, exercise, and medicine affect your blood glucose.  It allows you to know what your blood glucose is at any given moment. You can quickly tell if you are having low blood glucose (hypoglycemia) or high blood glucose (hyperglycemia).  It can help you and your health care provider know how to adjust your medicines.  It can help you understand how to manage an illness or adjust medicine for exercise. WHEN SHOULD YOU TEST? Your health care provider will help you decide how often you should check your blood glucose. This may depend on the type of diabetes you have, your diabetes control, or the types of medicines you are taking. Be sure to write down all of your blood glucose readings so that this information can be reviewed with your health care provider. See below for examples of testing times that your health care provider may suggest. Type 1 Diabetes  Test 4 times a day if you are in good control, using an insulin pump, or perform multiple daily injections.  If your diabetes is not well controlled or if you are sick, you may need to monitor more often.  It is a good idea to also monitor:  Before and after exercise.  Between meals and 2 hours after a meal.  Occasionally between 2:00 a.m. and 3:00 a.m. Type 2 Diabetes  It can vary with each person, but generally, if you are on insulin, test 4 times a day.  If you take medicines by mouth  (orally), test 2 times a day.  If you are on a controlled diet, test once a day.  If your diabetes is not well controlled or if you are sick, you may need to monitor more often. HOW TO MONITOR YOUR BLOOD GLUCOSE Supplies Needed  Blood glucose meter.  Test strips for your meter. Each meter has its own strips. You must use the strips that go with your own meter.  A pricking needle (lancet).  A device that holds the lancet (lancing device).  A journal or log book to write down your results. Procedure  Wash your hands with soap and water. Alcohol is not preferred.  Prick the side of your finger (not the tip) with the lancet.  Gently milk the finger until a small drop of blood appears.  Follow the instructions that come with your meter for inserting the test strip, applying blood to the strip, and using your blood glucose meter. Other Areas to Get Blood for Testing Some meters allow you to use other areas of your body (other than your finger) to test your blood. These areas are called alternative sites. The most common alternative sites are:  The forearm.  The thigh.  The back area of the lower leg.  The palm of the hand. The blood flow in these areas is slower. Therefore, the blood glucose values you get may be delayed, and the numbers are different from what you would get from your fingers. Do not use alternative sites if you think you are having hypoglycemia. Your reading will not be accurate. Always use a finger if you are having hypoglycemia. Also, if you cannot feel your lows (hypoglycemia unawareness), always use your fingers for your blood glucose checks. ADDITIONAL TIPS FOR GLUCOSE MONITORING  Do not reuse lancets.  Always carry your supplies with you.  All blood glucose meters have a 24-hour "hotline" number to call if you have questions or need help.  Adjust (calibrate) your blood glucose meter with a control solution after finishing a few boxes of strips. BLOOD  GLUCOSE RECORD KEEPING It is a good idea to keep a daily record or log of your blood glucose readings. Most glucose meters, if not all, keep your glucose records stored in the meter. Some meters come with the ability to download your records to your home computer. Keeping a record of your blood glucose readings is especially helpful if you are wanting to look for patterns. Make notes to go along with the blood glucose readings because you might forget what happened at that exact time. Keeping good records helps you and your health care provider to work together to achieve good diabetes management.  Document Released: 10/04/2003 Document Revised: 02/15/2014 Document Reviewed: 02/23/2013 [facility] Patient Information 2015 [place], [place]. This information is not intended to replace advice given to you by your health care provider. Make sure you discuss any questions you have with your health  care provider.  

## 2015-04-30 NOTE — Procedures (Signed)
On dialysis.  Pt more alert today.  Has swollen LUE which he is scheduled to have intervention at [facility] next week. No hemodynamic instability. Ericia Moxley C

## 2015-04-30 NOTE — Progress Notes (Signed)
Patient being discharged home vitals are stable no complaints of pain, educated on medication and follow up appointments, wife and daughter will accompany him home, IV removed and discharge papers signed.

## 2015-05-02 DIAGNOSIS — E119 Type 2 diabetes mellitus without complications: Secondary | ICD-10-CM | POA: Diagnosis not present

## 2015-05-02 DIAGNOSIS — D631 Anemia in chronic kidney disease: Secondary | ICD-10-CM | POA: Diagnosis not present

## 2015-05-02 DIAGNOSIS — N2581 Secondary hyperparathyroidism of renal origin: Secondary | ICD-10-CM | POA: Diagnosis not present

## 2015-05-02 DIAGNOSIS — N186 End stage renal disease: Secondary | ICD-10-CM | POA: Diagnosis not present

## 2015-05-02 DIAGNOSIS — D509 Iron deficiency anemia, unspecified: Secondary | ICD-10-CM | POA: Diagnosis not present

## 2015-05-03 DIAGNOSIS — N189 Chronic kidney disease, unspecified: Secondary | ICD-10-CM | POA: Diagnosis not present

## 2015-05-03 DIAGNOSIS — I871 Compression of vein: Secondary | ICD-10-CM | POA: Diagnosis not present

## 2015-05-03 DIAGNOSIS — T82858D Stenosis of vascular prosthetic devices, implants and grafts, subsequent encounter: Secondary | ICD-10-CM | POA: Diagnosis not present

## 2015-05-03 DIAGNOSIS — Z992 Dependence on renal dialysis: Secondary | ICD-10-CM | POA: Diagnosis not present

## 2015-05-04 DIAGNOSIS — D631 Anemia in chronic kidney disease: Secondary | ICD-10-CM | POA: Diagnosis not present

## 2015-05-04 DIAGNOSIS — N186 End stage renal disease: Secondary | ICD-10-CM | POA: Diagnosis not present

## 2015-05-04 DIAGNOSIS — D509 Iron deficiency anemia, unspecified: Secondary | ICD-10-CM | POA: Diagnosis not present

## 2015-05-04 DIAGNOSIS — E119 Type 2 diabetes mellitus without complications: Secondary | ICD-10-CM | POA: Diagnosis not present

## 2015-05-04 DIAGNOSIS — N2581 Secondary hyperparathyroidism of renal origin: Secondary | ICD-10-CM | POA: Diagnosis not present

## 2015-05-06 ENCOUNTER — Telehealth: Payer: Self-pay | Admitting: *Deleted

## 2015-05-06 ENCOUNTER — Ambulatory Visit (HOSPITAL_COMMUNITY): Admission: RE | Admit: 2015-05-06 | Payer: Medicare Other | Source: Ambulatory Visit

## 2015-05-06 DIAGNOSIS — E119 Type 2 diabetes mellitus without complications: Secondary | ICD-10-CM | POA: Diagnosis not present

## 2015-05-06 DIAGNOSIS — N2581 Secondary hyperparathyroidism of renal origin: Secondary | ICD-10-CM | POA: Diagnosis not present

## 2015-05-06 DIAGNOSIS — D509 Iron deficiency anemia, unspecified: Secondary | ICD-10-CM | POA: Diagnosis not present

## 2015-05-06 DIAGNOSIS — N186 End stage renal disease: Secondary | ICD-10-CM | POA: Diagnosis not present

## 2015-05-06 DIAGNOSIS — D631 Anemia in chronic kidney disease: Secondary | ICD-10-CM | POA: Diagnosis not present

## 2015-05-06 NOTE — Telephone Encounter (Signed)
MRI has be cancelled patient with penile implant . DrJaffe is aware

## 2015-05-09 DIAGNOSIS — E119 Type 2 diabetes mellitus without complications: Secondary | ICD-10-CM | POA: Diagnosis not present

## 2015-05-09 DIAGNOSIS — D631 Anemia in chronic kidney disease: Secondary | ICD-10-CM | POA: Diagnosis not present

## 2015-05-09 DIAGNOSIS — N2581 Secondary hyperparathyroidism of renal origin: Secondary | ICD-10-CM | POA: Diagnosis not present

## 2015-05-09 DIAGNOSIS — N186 End stage renal disease: Secondary | ICD-10-CM | POA: Diagnosis not present

## 2015-05-09 DIAGNOSIS — D509 Iron deficiency anemia, unspecified: Secondary | ICD-10-CM | POA: Diagnosis not present

## 2015-05-11 DIAGNOSIS — D631 Anemia in chronic kidney disease: Secondary | ICD-10-CM | POA: Diagnosis not present

## 2015-05-11 DIAGNOSIS — E1129 Type 2 diabetes mellitus with other diabetic kidney complication: Secondary | ICD-10-CM | POA: Diagnosis not present

## 2015-05-11 DIAGNOSIS — D509 Iron deficiency anemia, unspecified: Secondary | ICD-10-CM | POA: Diagnosis not present

## 2015-05-11 DIAGNOSIS — N186 End stage renal disease: Secondary | ICD-10-CM | POA: Diagnosis not present

## 2015-05-11 DIAGNOSIS — E119 Type 2 diabetes mellitus without complications: Secondary | ICD-10-CM | POA: Diagnosis not present

## 2015-05-11 DIAGNOSIS — N2581 Secondary hyperparathyroidism of renal origin: Secondary | ICD-10-CM | POA: Diagnosis not present

## 2015-05-13 DIAGNOSIS — E119 Type 2 diabetes mellitus without complications: Secondary | ICD-10-CM | POA: Diagnosis not present

## 2015-05-13 DIAGNOSIS — N186 End stage renal disease: Secondary | ICD-10-CM | POA: Diagnosis not present

## 2015-05-13 DIAGNOSIS — D509 Iron deficiency anemia, unspecified: Secondary | ICD-10-CM | POA: Diagnosis not present

## 2015-05-13 DIAGNOSIS — N2581 Secondary hyperparathyroidism of renal origin: Secondary | ICD-10-CM | POA: Diagnosis not present

## 2015-05-13 DIAGNOSIS — D631 Anemia in chronic kidney disease: Secondary | ICD-10-CM | POA: Diagnosis not present

## 2015-05-15 DIAGNOSIS — N186 End stage renal disease: Secondary | ICD-10-CM | POA: Diagnosis not present

## 2015-05-15 DIAGNOSIS — Z992 Dependence on renal dialysis: Secondary | ICD-10-CM | POA: Diagnosis not present

## 2015-05-15 DIAGNOSIS — E1129 Type 2 diabetes mellitus with other diabetic kidney complication: Secondary | ICD-10-CM | POA: Diagnosis not present

## 2015-05-16 DIAGNOSIS — D631 Anemia in chronic kidney disease: Secondary | ICD-10-CM | POA: Diagnosis not present

## 2015-05-16 DIAGNOSIS — D509 Iron deficiency anemia, unspecified: Secondary | ICD-10-CM | POA: Diagnosis not present

## 2015-05-16 DIAGNOSIS — E119 Type 2 diabetes mellitus without complications: Secondary | ICD-10-CM | POA: Diagnosis not present

## 2015-05-16 DIAGNOSIS — N2581 Secondary hyperparathyroidism of renal origin: Secondary | ICD-10-CM | POA: Diagnosis not present

## 2015-05-16 DIAGNOSIS — N186 End stage renal disease: Secondary | ICD-10-CM | POA: Diagnosis not present

## 2015-05-18 DIAGNOSIS — N186 End stage renal disease: Secondary | ICD-10-CM | POA: Diagnosis not present

## 2015-05-18 DIAGNOSIS — D509 Iron deficiency anemia, unspecified: Secondary | ICD-10-CM | POA: Diagnosis not present

## 2015-05-18 DIAGNOSIS — D631 Anemia in chronic kidney disease: Secondary | ICD-10-CM | POA: Diagnosis not present

## 2015-05-18 DIAGNOSIS — N2581 Secondary hyperparathyroidism of renal origin: Secondary | ICD-10-CM | POA: Diagnosis not present

## 2015-05-18 DIAGNOSIS — E119 Type 2 diabetes mellitus without complications: Secondary | ICD-10-CM | POA: Diagnosis not present

## 2015-05-20 DIAGNOSIS — N186 End stage renal disease: Secondary | ICD-10-CM | POA: Diagnosis not present

## 2015-05-20 DIAGNOSIS — D509 Iron deficiency anemia, unspecified: Secondary | ICD-10-CM | POA: Diagnosis not present

## 2015-05-20 DIAGNOSIS — D631 Anemia in chronic kidney disease: Secondary | ICD-10-CM | POA: Diagnosis not present

## 2015-05-20 DIAGNOSIS — N2581 Secondary hyperparathyroidism of renal origin: Secondary | ICD-10-CM | POA: Diagnosis not present

## 2015-05-20 DIAGNOSIS — E119 Type 2 diabetes mellitus without complications: Secondary | ICD-10-CM | POA: Diagnosis not present

## 2015-05-23 DIAGNOSIS — N186 End stage renal disease: Secondary | ICD-10-CM | POA: Diagnosis not present

## 2015-05-23 DIAGNOSIS — D631 Anemia in chronic kidney disease: Secondary | ICD-10-CM | POA: Diagnosis not present

## 2015-05-23 DIAGNOSIS — N2581 Secondary hyperparathyroidism of renal origin: Secondary | ICD-10-CM | POA: Diagnosis not present

## 2015-05-23 DIAGNOSIS — D509 Iron deficiency anemia, unspecified: Secondary | ICD-10-CM | POA: Diagnosis not present

## 2015-05-23 DIAGNOSIS — E119 Type 2 diabetes mellitus without complications: Secondary | ICD-10-CM | POA: Diagnosis not present

## 2015-05-25 DIAGNOSIS — N2581 Secondary hyperparathyroidism of renal origin: Secondary | ICD-10-CM | POA: Diagnosis not present

## 2015-05-25 DIAGNOSIS — E119 Type 2 diabetes mellitus without complications: Secondary | ICD-10-CM | POA: Diagnosis not present

## 2015-05-25 DIAGNOSIS — D509 Iron deficiency anemia, unspecified: Secondary | ICD-10-CM | POA: Diagnosis not present

## 2015-05-25 DIAGNOSIS — D631 Anemia in chronic kidney disease: Secondary | ICD-10-CM | POA: Diagnosis not present

## 2015-05-25 DIAGNOSIS — N186 End stage renal disease: Secondary | ICD-10-CM | POA: Diagnosis not present

## 2015-05-27 DIAGNOSIS — N186 End stage renal disease: Secondary | ICD-10-CM | POA: Diagnosis not present

## 2015-05-27 DIAGNOSIS — E119 Type 2 diabetes mellitus without complications: Secondary | ICD-10-CM | POA: Diagnosis not present

## 2015-05-27 DIAGNOSIS — D509 Iron deficiency anemia, unspecified: Secondary | ICD-10-CM | POA: Diagnosis not present

## 2015-05-27 DIAGNOSIS — D631 Anemia in chronic kidney disease: Secondary | ICD-10-CM | POA: Diagnosis not present

## 2015-05-27 DIAGNOSIS — N2581 Secondary hyperparathyroidism of renal origin: Secondary | ICD-10-CM | POA: Diagnosis not present

## 2015-05-27 DIAGNOSIS — E46 Unspecified protein-calorie malnutrition: Secondary | ICD-10-CM | POA: Insufficient documentation

## 2015-05-30 DIAGNOSIS — N2581 Secondary hyperparathyroidism of renal origin: Secondary | ICD-10-CM | POA: Diagnosis not present

## 2015-05-30 DIAGNOSIS — D631 Anemia in chronic kidney disease: Secondary | ICD-10-CM | POA: Diagnosis not present

## 2015-05-30 DIAGNOSIS — E119 Type 2 diabetes mellitus without complications: Secondary | ICD-10-CM | POA: Diagnosis not present

## 2015-05-30 DIAGNOSIS — N186 End stage renal disease: Secondary | ICD-10-CM | POA: Diagnosis not present

## 2015-05-30 DIAGNOSIS — D509 Iron deficiency anemia, unspecified: Secondary | ICD-10-CM | POA: Diagnosis not present

## 2015-06-01 DIAGNOSIS — D509 Iron deficiency anemia, unspecified: Secondary | ICD-10-CM | POA: Diagnosis not present

## 2015-06-01 DIAGNOSIS — N186 End stage renal disease: Secondary | ICD-10-CM | POA: Diagnosis not present

## 2015-06-01 DIAGNOSIS — E119 Type 2 diabetes mellitus without complications: Secondary | ICD-10-CM | POA: Diagnosis not present

## 2015-06-01 DIAGNOSIS — D631 Anemia in chronic kidney disease: Secondary | ICD-10-CM | POA: Diagnosis not present

## 2015-06-01 DIAGNOSIS — N2581 Secondary hyperparathyroidism of renal origin: Secondary | ICD-10-CM | POA: Diagnosis not present

## 2015-06-03 DIAGNOSIS — D631 Anemia in chronic kidney disease: Secondary | ICD-10-CM | POA: Diagnosis not present

## 2015-06-03 DIAGNOSIS — D509 Iron deficiency anemia, unspecified: Secondary | ICD-10-CM | POA: Diagnosis not present

## 2015-06-03 DIAGNOSIS — E119 Type 2 diabetes mellitus without complications: Secondary | ICD-10-CM | POA: Diagnosis not present

## 2015-06-03 DIAGNOSIS — N2581 Secondary hyperparathyroidism of renal origin: Secondary | ICD-10-CM | POA: Diagnosis not present

## 2015-06-03 DIAGNOSIS — N186 End stage renal disease: Secondary | ICD-10-CM | POA: Diagnosis not present

## 2015-06-06 DIAGNOSIS — D631 Anemia in chronic kidney disease: Secondary | ICD-10-CM | POA: Diagnosis not present

## 2015-06-06 DIAGNOSIS — D509 Iron deficiency anemia, unspecified: Secondary | ICD-10-CM | POA: Diagnosis not present

## 2015-06-06 DIAGNOSIS — N186 End stage renal disease: Secondary | ICD-10-CM | POA: Diagnosis not present

## 2015-06-06 DIAGNOSIS — N2581 Secondary hyperparathyroidism of renal origin: Secondary | ICD-10-CM | POA: Diagnosis not present

## 2015-06-06 DIAGNOSIS — E119 Type 2 diabetes mellitus without complications: Secondary | ICD-10-CM | POA: Diagnosis not present

## 2015-06-08 DIAGNOSIS — N2581 Secondary hyperparathyroidism of renal origin: Secondary | ICD-10-CM | POA: Diagnosis not present

## 2015-06-08 DIAGNOSIS — D509 Iron deficiency anemia, unspecified: Secondary | ICD-10-CM | POA: Diagnosis not present

## 2015-06-08 DIAGNOSIS — D631 Anemia in chronic kidney disease: Secondary | ICD-10-CM | POA: Diagnosis not present

## 2015-06-08 DIAGNOSIS — N186 End stage renal disease: Secondary | ICD-10-CM | POA: Diagnosis not present

## 2015-06-08 DIAGNOSIS — E119 Type 2 diabetes mellitus without complications: Secondary | ICD-10-CM | POA: Diagnosis not present

## 2015-06-10 DIAGNOSIS — E119 Type 2 diabetes mellitus without complications: Secondary | ICD-10-CM | POA: Diagnosis not present

## 2015-06-10 DIAGNOSIS — N2581 Secondary hyperparathyroidism of renal origin: Secondary | ICD-10-CM | POA: Diagnosis not present

## 2015-06-10 DIAGNOSIS — D509 Iron deficiency anemia, unspecified: Secondary | ICD-10-CM | POA: Diagnosis not present

## 2015-06-10 DIAGNOSIS — D631 Anemia in chronic kidney disease: Secondary | ICD-10-CM | POA: Diagnosis not present

## 2015-06-10 DIAGNOSIS — N186 End stage renal disease: Secondary | ICD-10-CM | POA: Diagnosis not present

## 2015-06-13 DIAGNOSIS — N2581 Secondary hyperparathyroidism of renal origin: Secondary | ICD-10-CM | POA: Diagnosis not present

## 2015-06-13 DIAGNOSIS — N186 End stage renal disease: Secondary | ICD-10-CM | POA: Diagnosis not present

## 2015-06-13 DIAGNOSIS — D631 Anemia in chronic kidney disease: Secondary | ICD-10-CM | POA: Diagnosis not present

## 2015-06-13 DIAGNOSIS — E119 Type 2 diabetes mellitus without complications: Secondary | ICD-10-CM | POA: Diagnosis not present

## 2015-06-13 DIAGNOSIS — D509 Iron deficiency anemia, unspecified: Secondary | ICD-10-CM | POA: Diagnosis not present

## 2015-06-15 DIAGNOSIS — D631 Anemia in chronic kidney disease: Secondary | ICD-10-CM | POA: Diagnosis not present

## 2015-06-15 DIAGNOSIS — N2581 Secondary hyperparathyroidism of renal origin: Secondary | ICD-10-CM | POA: Diagnosis not present

## 2015-06-15 DIAGNOSIS — D509 Iron deficiency anemia, unspecified: Secondary | ICD-10-CM | POA: Diagnosis not present

## 2015-06-15 DIAGNOSIS — N186 End stage renal disease: Secondary | ICD-10-CM | POA: Diagnosis not present

## 2015-06-15 DIAGNOSIS — Z992 Dependence on renal dialysis: Secondary | ICD-10-CM | POA: Diagnosis not present

## 2015-06-15 DIAGNOSIS — E119 Type 2 diabetes mellitus without complications: Secondary | ICD-10-CM | POA: Diagnosis not present

## 2015-06-15 DIAGNOSIS — E1129 Type 2 diabetes mellitus with other diabetic kidney complication: Secondary | ICD-10-CM | POA: Diagnosis not present

## 2015-06-17 DIAGNOSIS — N186 End stage renal disease: Secondary | ICD-10-CM | POA: Diagnosis not present

## 2015-06-17 DIAGNOSIS — N2581 Secondary hyperparathyroidism of renal origin: Secondary | ICD-10-CM | POA: Diagnosis not present

## 2015-06-17 DIAGNOSIS — D631 Anemia in chronic kidney disease: Secondary | ICD-10-CM | POA: Diagnosis not present

## 2015-06-17 DIAGNOSIS — D509 Iron deficiency anemia, unspecified: Secondary | ICD-10-CM | POA: Diagnosis not present

## 2015-06-20 DIAGNOSIS — D631 Anemia in chronic kidney disease: Secondary | ICD-10-CM | POA: Diagnosis not present

## 2015-06-20 DIAGNOSIS — D509 Iron deficiency anemia, unspecified: Secondary | ICD-10-CM | POA: Diagnosis not present

## 2015-06-20 DIAGNOSIS — N186 End stage renal disease: Secondary | ICD-10-CM | POA: Diagnosis not present

## 2015-06-20 DIAGNOSIS — N2581 Secondary hyperparathyroidism of renal origin: Secondary | ICD-10-CM | POA: Diagnosis not present

## 2015-06-22 DIAGNOSIS — D509 Iron deficiency anemia, unspecified: Secondary | ICD-10-CM | POA: Diagnosis not present

## 2015-06-22 DIAGNOSIS — D631 Anemia in chronic kidney disease: Secondary | ICD-10-CM | POA: Diagnosis not present

## 2015-06-22 DIAGNOSIS — N186 End stage renal disease: Secondary | ICD-10-CM | POA: Diagnosis not present

## 2015-06-22 DIAGNOSIS — N2581 Secondary hyperparathyroidism of renal origin: Secondary | ICD-10-CM | POA: Diagnosis not present

## 2015-06-24 ENCOUNTER — Telehealth: Payer: Self-pay | Admitting: Neurology

## 2015-06-24 DIAGNOSIS — N2581 Secondary hyperparathyroidism of renal origin: Secondary | ICD-10-CM | POA: Diagnosis not present

## 2015-06-24 DIAGNOSIS — D509 Iron deficiency anemia, unspecified: Secondary | ICD-10-CM | POA: Diagnosis not present

## 2015-06-24 DIAGNOSIS — D631 Anemia in chronic kidney disease: Secondary | ICD-10-CM | POA: Diagnosis not present

## 2015-06-24 DIAGNOSIS — N186 End stage renal disease: Secondary | ICD-10-CM | POA: Diagnosis not present

## 2015-06-24 NOTE — Telephone Encounter (Signed)
Spoke with Education officer, museum from dialysis unit. There was a confusion of how many pills patient should be getting per month. He should be getting 72 pills of Keppra monthly. Social worker advised and verbalized understanding. [facility]

## 2015-06-24 NOTE — Telephone Encounter (Signed)
Pt's Social worker/and wife Ernest [name]/ call concerning a med/"Keppra"//[phone]

## 2015-06-27 DIAGNOSIS — D509 Iron deficiency anemia, unspecified: Secondary | ICD-10-CM | POA: Diagnosis not present

## 2015-06-27 DIAGNOSIS — N186 End stage renal disease: Secondary | ICD-10-CM | POA: Diagnosis not present

## 2015-06-27 DIAGNOSIS — N2581 Secondary hyperparathyroidism of renal origin: Secondary | ICD-10-CM | POA: Diagnosis not present

## 2015-06-27 DIAGNOSIS — D631 Anemia in chronic kidney disease: Secondary | ICD-10-CM | POA: Diagnosis not present

## 2015-06-29 DIAGNOSIS — D631 Anemia in chronic kidney disease: Secondary | ICD-10-CM | POA: Diagnosis not present

## 2015-06-29 DIAGNOSIS — N186 End stage renal disease: Secondary | ICD-10-CM | POA: Diagnosis not present

## 2015-06-29 DIAGNOSIS — D509 Iron deficiency anemia, unspecified: Secondary | ICD-10-CM | POA: Diagnosis not present

## 2015-06-29 DIAGNOSIS — N2581 Secondary hyperparathyroidism of renal origin: Secondary | ICD-10-CM | POA: Diagnosis not present

## 2015-07-01 DIAGNOSIS — D631 Anemia in chronic kidney disease: Secondary | ICD-10-CM | POA: Diagnosis not present

## 2015-07-01 DIAGNOSIS — D509 Iron deficiency anemia, unspecified: Secondary | ICD-10-CM | POA: Diagnosis not present

## 2015-07-01 DIAGNOSIS — N2581 Secondary hyperparathyroidism of renal origin: Secondary | ICD-10-CM | POA: Diagnosis not present

## 2015-07-01 DIAGNOSIS — N186 End stage renal disease: Secondary | ICD-10-CM | POA: Diagnosis not present

## 2015-07-04 DIAGNOSIS — D631 Anemia in chronic kidney disease: Secondary | ICD-10-CM | POA: Diagnosis not present

## 2015-07-04 DIAGNOSIS — N186 End stage renal disease: Secondary | ICD-10-CM | POA: Diagnosis not present

## 2015-07-04 DIAGNOSIS — N2581 Secondary hyperparathyroidism of renal origin: Secondary | ICD-10-CM | POA: Diagnosis not present

## 2015-07-04 DIAGNOSIS — D509 Iron deficiency anemia, unspecified: Secondary | ICD-10-CM | POA: Diagnosis not present

## 2015-07-06 ENCOUNTER — Emergency Department (HOSPITAL_COMMUNITY)
Admission: EM | Admit: 2015-07-06 | Discharge: 2015-07-06 | Disposition: A | Discharge status: Home / Self Care | Payer: Medicare Other | Attending: Emergency Medicine | Admitting: Emergency Medicine

## 2015-07-06 ENCOUNTER — Encounter (HOSPITAL_COMMUNITY): Payer: Self-pay | Admitting: Emergency Medicine

## 2015-07-06 DIAGNOSIS — N186 End stage renal disease: Secondary | ICD-10-CM | POA: Diagnosis not present

## 2015-07-06 DIAGNOSIS — Z992 Dependence on renal dialysis: Secondary | ICD-10-CM | POA: Diagnosis not present

## 2015-07-06 DIAGNOSIS — E785 Hyperlipidemia, unspecified: Secondary | ICD-10-CM | POA: Insufficient documentation

## 2015-07-06 DIAGNOSIS — I2510 Atherosclerotic heart disease of native coronary artery without angina pectoris: Secondary | ICD-10-CM | POA: Insufficient documentation

## 2015-07-06 DIAGNOSIS — M109 Gout, unspecified: Secondary | ICD-10-CM | POA: Insufficient documentation

## 2015-07-06 DIAGNOSIS — D631 Anemia in chronic kidney disease: Secondary | ICD-10-CM | POA: Diagnosis not present

## 2015-07-06 DIAGNOSIS — E669 Obesity, unspecified: Secondary | ICD-10-CM | POA: Diagnosis not present

## 2015-07-06 DIAGNOSIS — Z79899 Other long term (current) drug therapy: Secondary | ICD-10-CM | POA: Insufficient documentation

## 2015-07-06 DIAGNOSIS — N2581 Secondary hyperparathyroidism of renal origin: Secondary | ICD-10-CM | POA: Diagnosis not present

## 2015-07-06 DIAGNOSIS — T82838A Hemorrhage of vascular prosthetic devices, implants and grafts, initial encounter: Secondary | ICD-10-CM | POA: Insufficient documentation

## 2015-07-06 DIAGNOSIS — Z7982 Long term (current) use of aspirin: Secondary | ICD-10-CM | POA: Insufficient documentation

## 2015-07-06 DIAGNOSIS — T829XXA Unspecified complication of cardiac and vascular prosthetic device, implant and graft, initial encounter: Secondary | ICD-10-CM

## 2015-07-06 DIAGNOSIS — T82590A Other mechanical complication of surgically created arteriovenous fistula, initial encounter: Secondary | ICD-10-CM | POA: Diagnosis not present

## 2015-07-06 DIAGNOSIS — Z8673 Personal history of transient ischemic attack (TIA), and cerebral infarction without residual deficits: Secondary | ICD-10-CM | POA: Insufficient documentation

## 2015-07-06 DIAGNOSIS — Z8619 Personal history of other infectious and parasitic diseases: Secondary | ICD-10-CM | POA: Insufficient documentation

## 2015-07-06 DIAGNOSIS — J449 Chronic obstructive pulmonary disease, unspecified: Secondary | ICD-10-CM | POA: Insufficient documentation

## 2015-07-06 DIAGNOSIS — Z794 Long term (current) use of insulin: Secondary | ICD-10-CM | POA: Diagnosis not present

## 2015-07-06 DIAGNOSIS — Z87891 Personal history of nicotine dependence: Secondary | ICD-10-CM | POA: Diagnosis not present

## 2015-07-06 DIAGNOSIS — D509 Iron deficiency anemia, unspecified: Secondary | ICD-10-CM | POA: Diagnosis not present

## 2015-07-06 DIAGNOSIS — I120 Hypertensive chronic kidney disease with stage 5 chronic kidney disease or end stage renal disease: Secondary | ICD-10-CM | POA: Diagnosis not present

## 2015-07-06 DIAGNOSIS — E119 Type 2 diabetes mellitus without complications: Secondary | ICD-10-CM | POA: Insufficient documentation

## 2015-07-06 DIAGNOSIS — Y841 Kidney dialysis as the cause of abnormal reaction of the patient, or of later complication, without mention of misadventure at the time of the procedure: Secondary | ICD-10-CM | POA: Diagnosis not present

## 2015-07-06 DIAGNOSIS — I5022 Chronic systolic (congestive) heart failure: Secondary | ICD-10-CM | POA: Insufficient documentation

## 2015-07-06 LAB — I-STAT CHEM 8, ED
BUN: 24 mg/dL — AB (ref 6–20)
CALCIUM ION: 0.97 mmol/L — AB (ref 1.13–1.30)
CHLORIDE: 95 mmol/L — AB (ref 101–111)
CREATININE: 5.4 mg/dL — AB (ref 0.61–1.24)
GLUCOSE: 95 mg/dL (ref 65–99)
HCT: 41 % (ref 39.0–52.0)
Hemoglobin: 13.9 g/dL (ref 13.0–17.0)
Potassium: 3.5 mmol/L (ref 3.5–5.1)
SODIUM: 139 mmol/L (ref 135–145)
TCO2: 34 mmol/L (ref 0–100)

## 2015-07-06 LAB — APTT: aPTT: 29 seconds (ref 24–37)

## 2015-07-06 NOTE — Discharge Instructions (Signed)
The bleeding appears to have stopped from your dialysis graft. Follow-up with dialysis in 2 days as planned. Call EMS for return of bleeding.

## 2015-07-06 NOTE — ED Notes (Signed)
To ED via [facility] from dialysis center with c/o left upper arm graft bleeding. Pt was done with dialysis at 1130, graft has not stopped bleeding since. Has clamp from dialysis on with bandages around per EMS- Dr Alvino Chapel in room on arrival, removed dressing, attempted to removed clamp, continues to bleed. Clamp not removed. Pt is alert/oriented x 4, w/d,

## 2015-07-06 NOTE — ED Provider Notes (Signed)
CSN: [id number]     Arrival date & time 07/06/15  1435 History   First MD Initiated Contact with Patient 07/06/15 1436     Chief Complaint  Patient presents with  . AV graft bleeding   . Vascular Access Problem     (Consider location/radiation/quality/duration/timing/severity/associated sxs/prior Treatment) The history is provided by the patient.  patient had dialysis today. Finished around 11:30. Since then they have been able to consistently get the bleeding stopped from his graft. Is on his left forearm. Put in by Dr. Kellie Simmering. Blood pressures been good. Patient is on aspirin and Plavix. No other sites of bleeding.  Past Medical History  Diagnosis Date  . Hyperlipidemia   . Retinopathy   . Gout   . ESRD (end stage renal disease)     Started HD in [place] in 2009, ESRD was due to DM. Moved to [facility] in Dec 2009 and now gets dialysis at [facility] on a MWF schedule.     . Type II or unspecified type diabetes mellitus without mention of complication, not stated as uncontrolled     adult onset  . Erectile dysfunction     penile implant  . Abnormal stress test     s/p cath November 2013 with modest disease involving the ostial left main, proximal LAD, proximal RCA - do not appear to be hemodynamically signficant; mild LV dysfunction  . Obesity   . Hypertension   . Hepatitis C   . COPD (chronic obstructive pulmonary disease)   . Coronary artery disease     per cath report 2013  . Ejection fraction < 50%   . Chronic systolic CHF (congestive heart failure)   . Bacteremia   . Shortness of breath     Hx: of with exertion  . Gout     Hx: of  . Stroke    Past Surgical History  Procedure Laterality Date  . Penile prosthesis implant    . Fistula      RUE and wrist  . Cardiac catheterization  August 26, 2012  . Eye surgery      laser.  and surgery for DM  . Insertion of dialysis catheter Right 01/01/2013    Procedure: INSERTION OF DIALYSIS CATHETER right  internal jugular;   Surgeon: Serafina Mitchell, MD;  Location: [place];  Service: Vascular;  Laterality: Right;  . Av fistula placement Left 01/13/2013    Procedure: INSERTION OF ARTERIOVENOUS (AV) GORE-TEX GRAFT ARM;  Surgeon: Angelia Mould, MD;  Location: [place];  Service: Vascular;  Laterality: Left;  . Thrombectomy w/ embolectomy Left 03/26/2013    Procedure: Attempted thrombectomy of left arm arteriovenous goretex graft.;  Surgeon: Angelia Mould, MD;  Location: [place];  Service: Vascular;  Laterality: Left;  . Insertion of dialysis catheter N/A 03/26/2013    Procedure: INSERTION OF DIALYSIS CATHETER Left internal jugular vein;  Surgeon: Angelia Mould, MD;  Location: [place];  Service: Vascular;  Laterality: N/A;  . Avgg removal Left 03/26/2013    Procedure: REMOVAL OF  NON INCORPORATED ARTERIOVENOUS GORETEX GRAFT ([place]) left arm * repair of  left brachial artery with vein patch angioplasty.;  Surgeon: Angelia Mould, MD;  Location: [place];  Service: Vascular;  Laterality: Left;  . Av fistula placement Left 05/05/2013    Procedure: INSERTION OF LEFT UPPER ARM  ARTERIOVENOUS GORTEX GRAFT;  Surgeon: Angelia Mould, MD;  Location: [place];  Service: Vascular;  Laterality: Left;  . Tee without cardioversion  N/A 06/11/2013    Procedure: TRANSESOPHAGEAL ECHOCARDIOGRAM (TEE);  Surgeon: Larey Dresser, MD;  Location: [place];  Service: Cardiovascular;  Laterality: N/A;  . Embolectomy Right 08/27/2013    Procedure: EMBOLECTOMY;  Surgeon: Mal Misty, MD;  Location: [place];  Service: Vascular;  Laterality: Right;  Thrombectomy of Radial and ulnar artery.  . Cataract extraction w/ intraocular lens  implant, bilateral    . Colonoscopy    . Ligation of arteriovenous  fistula Right 12/30/2013    Procedure: REMOVAL OF SEGMENT OF GORTEX GRAFT AND FISTULA  AND REPAIR OF BRACHIAL ARTERY;  Surgeon: Mal Misty, MD;  Location: [place];  Service: Vascular;  Laterality: Right;  Annell Greening N/A 04/07/2013     Procedure: VENOGRAM;  Surgeon: Serafina Mitchell, MD;  Location: [facility] CATH LAB;  Service: Cardiovascular;  Laterality: N/A;   Family History  Problem Relation Age of Onset  . Heart disease Father   . Diabetes Sister   . Diabetes Brother   . Diabetes Son    Social History  Substance Use Topics  . Smoking status: Former Smoker -- 7 years    Quit date: 06/10/1973  . Smokeless tobacco: Never Used  . Alcohol Use: No    Review of Systems  Constitutional: Negative for appetite change.  Respiratory: Negative for shortness of breath.   Cardiovascular: Negative for chest pain.  Gastrointestinal: Negative for abdominal pain.  Skin: Negative for wound.  Hematological: Bruises/bleeds easily.      Allergies  Review of patient's allergies indicates no known allergies.  Home Medications   Prior to Admission medications   Medication Sig Start Date End Date Taking? Authorizing Provider  allopurinol (ZYLOPRIM) 100 MG tablet Take 100 mg by mouth daily.  12/13/14   Historical Provider, MD  amitriptyline (ELAVIL) 25 MG tablet TAKE ONE TABLET BY MOUTH AT BEDTIME FOR  NEUROPATHY Patient taking differently: TAKE ONE TABLET BY MOUTH AT BEDTIME AS NEED  FOR  NEUROPATHY 03/18/14   Bronson Ing, DPM  aspirin EC 81 MG tablet Take 81 mg by mouth daily. 06/18/13   Carlena Bjornstad, MD  Calcium Acetate 667 MG TABS Take 2,001 tablets by mouth 3 (three) times daily with meals.     Historical Provider, MD  cinacalcet (SENSIPAR) 60 MG tablet Take 60 mg by mouth 2 (two) times daily.     Historical Provider, MD  clopidogrel (PLAVIX) 75 MG tablet Take 75 mg by mouth daily.     Historical Provider, MD  colchicine 0.6 MG tablet Take 0.6 mg by mouth daily.  12/13/14   Historical Provider, MD  insulin NPH Human (HUMULIN N,NOVOLIN N) 100 UNIT/ML injection Inject 0.1 mLs (10 Units total) into the skin 2 (two) times daily. 04/30/15   Corky Sox, MD  levETIRAcetam (KEPPRA) 500 MG tablet Take 2 tablets (1,000 mg total) by  mouth daily. Then take extra 500 mg (1 tablet) on MWF after HD 04/30/15   Corky Sox, MD  lidocaine-prilocaine (EMLA) cream Apply 1 application topically as needed (dialysis).    Historical Provider, MD  lisinopril (PRINIVIL,ZESTRIL) 20 MG tablet Take 20 mg by mouth daily.    Historical Provider, MD  metoprolol (LOPRESSOR) 50 MG tablet Take 50 mg by mouth daily.  01/27/15   Historical Provider, MD  multivitamin (RENA-VIT) TABS tablet Take 1 tablet by mouth daily.     Historical Provider, MD  sevelamer (RENVELA) 800 MG tablet Take 800-3,200 mg by mouth 5 (five) times daily. 4 tablets  three times daily with meals and 1 tablet twice daily with snacks.    Historical Provider, MD  simvastatin (ZOCOR) 20 MG tablet Take 20 mg by mouth daily at 6 PM.     Historical Provider, MD   BP 159/49 mmHg  Pulse 51  Temp(Src) 98.5 F (36.9 C) (Oral)  Resp 16  Ht 5\' 9"  (1.753 m)  Wt 225 lb (102.059 kg)  BMI 33.21 kg/m2  SpO2 97% Physical Exam  Constitutional: He appears well-developed.  HENT:  Head: Atraumatic.  Neck: Neck supple.  Cardiovascular: Normal rate.   Pulmonary/Chest: Effort normal.  Abdominal: Soft.  Musculoskeletal:  dialysis graft to left forearm, clamp on. Withclamp loosened begins to bleed from under the dressing. Good thrill and pulse in the graft. Pressure held over the distal part of the graft and bleeding controlled. Surgifoam and dressing placed. No further bleeding.    ED Course  Procedures (including critical care time) Labs Review Labs Reviewed  I-STAT CHEM 8, ED - Abnormal; Notable for the following:    Chloride 95 (*)    BUN 24 (*)    Creatinine, Ser 5.40 (*)    Calcium, Ion 0.97 (*)    All other components within normal limits  APTT    Imaging Review No results found. I have personally reviewed and evaluated these images and lab results as part of my medical decision-making.   EKG Interpretation None      MDM   Final diagnoses:  Complication of  arteriovenous dialysis fistula, initial encounter    Patient with bleeding dialysis graft. Controlled after treatment.will discharge home.    Davonna Belling, MD 07/08/15 1455

## 2015-07-06 NOTE — ED Notes (Signed)
Pt stable, ambulatory, states understanding of discharge instructions 

## 2015-07-08 DIAGNOSIS — D631 Anemia in chronic kidney disease: Secondary | ICD-10-CM | POA: Diagnosis not present

## 2015-07-08 DIAGNOSIS — N186 End stage renal disease: Secondary | ICD-10-CM | POA: Diagnosis not present

## 2015-07-08 DIAGNOSIS — N2581 Secondary hyperparathyroidism of renal origin: Secondary | ICD-10-CM | POA: Diagnosis not present

## 2015-07-08 DIAGNOSIS — D509 Iron deficiency anemia, unspecified: Secondary | ICD-10-CM | POA: Diagnosis not present

## 2015-07-11 ENCOUNTER — Observation Stay (HOSPITAL_COMMUNITY): Payer: Medicare Other

## 2015-07-11 ENCOUNTER — Emergency Department (HOSPITAL_COMMUNITY): Payer: Medicare Other

## 2015-07-11 ENCOUNTER — Encounter (HOSPITAL_COMMUNITY): Payer: Self-pay | Admitting: Emergency Medicine

## 2015-07-11 ENCOUNTER — Observation Stay (HOSPITAL_COMMUNITY)
Admission: EM | Admit: 2015-07-11 | Discharge: 2015-07-13 | Disposition: A | Discharge status: Rehabilitation Facility | Payer: Medicare Other | Attending: Student in an Organized Health Care Education/Training Program | Admitting: Student in an Organized Health Care Education/Training Program

## 2015-07-11 DIAGNOSIS — Z7902 Long term (current) use of antithrombotics/antiplatelets: Secondary | ICD-10-CM | POA: Diagnosis not present

## 2015-07-11 DIAGNOSIS — D631 Anemia in chronic kidney disease: Secondary | ICD-10-CM | POA: Diagnosis not present

## 2015-07-11 DIAGNOSIS — E1122 Type 2 diabetes mellitus with diabetic chronic kidney disease: Secondary | ICD-10-CM | POA: Diagnosis not present

## 2015-07-11 DIAGNOSIS — Z794 Long term (current) use of insulin: Secondary | ICD-10-CM | POA: Insufficient documentation

## 2015-07-11 DIAGNOSIS — E876 Hypokalemia: Secondary | ICD-10-CM | POA: Insufficient documentation

## 2015-07-11 DIAGNOSIS — G40909 Epilepsy, unspecified, not intractable, without status epilepticus: Secondary | ICD-10-CM | POA: Insufficient documentation

## 2015-07-11 DIAGNOSIS — Z79899 Other long term (current) drug therapy: Secondary | ICD-10-CM | POA: Insufficient documentation

## 2015-07-11 DIAGNOSIS — I629 Nontraumatic intracranial hemorrhage, unspecified: Secondary | ICD-10-CM | POA: Diagnosis not present

## 2015-07-11 DIAGNOSIS — H5462 Unqualified visual loss, left eye, normal vision right eye: Secondary | ICD-10-CM | POA: Diagnosis not present

## 2015-07-11 DIAGNOSIS — R262 Difficulty in walking, not elsewhere classified: Secondary | ICD-10-CM | POA: Insufficient documentation

## 2015-07-11 DIAGNOSIS — H532 Diplopia: Secondary | ICD-10-CM

## 2015-07-11 DIAGNOSIS — N2581 Secondary hyperparathyroidism of renal origin: Secondary | ICD-10-CM | POA: Diagnosis not present

## 2015-07-11 DIAGNOSIS — I639 Cerebral infarction, unspecified: Secondary | ICD-10-CM

## 2015-07-11 DIAGNOSIS — E1142 Type 2 diabetes mellitus with diabetic polyneuropathy: Secondary | ICD-10-CM | POA: Insufficient documentation

## 2015-07-11 DIAGNOSIS — I6350 Cerebral infarction due to unspecified occlusion or stenosis of unspecified cerebral artery: Secondary | ICD-10-CM

## 2015-07-11 DIAGNOSIS — D649 Anemia, unspecified: Secondary | ICD-10-CM | POA: Diagnosis not present

## 2015-07-11 DIAGNOSIS — Z992 Dependence on renal dialysis: Secondary | ICD-10-CM | POA: Insufficient documentation

## 2015-07-11 DIAGNOSIS — I2510 Atherosclerotic heart disease of native coronary artery without angina pectoris: Secondary | ICD-10-CM | POA: Insufficient documentation

## 2015-07-11 DIAGNOSIS — M109 Gout, unspecified: Secondary | ICD-10-CM | POA: Insufficient documentation

## 2015-07-11 DIAGNOSIS — Z955 Presence of coronary angioplasty implant and graft: Secondary | ICD-10-CM | POA: Diagnosis not present

## 2015-07-11 DIAGNOSIS — Z23 Encounter for immunization: Secondary | ICD-10-CM | POA: Diagnosis not present

## 2015-07-11 DIAGNOSIS — Z8673 Personal history of transient ischemic attack (TIA), and cerebral infarction without residual deficits: Secondary | ICD-10-CM | POA: Diagnosis not present

## 2015-07-11 DIAGNOSIS — Z7982 Long term (current) use of aspirin: Secondary | ICD-10-CM | POA: Insufficient documentation

## 2015-07-11 DIAGNOSIS — I120 Hypertensive chronic kidney disease with stage 5 chronic kidney disease or end stage renal disease: Secondary | ICD-10-CM | POA: Diagnosis not present

## 2015-07-11 DIAGNOSIS — E669 Obesity, unspecified: Secondary | ICD-10-CM | POA: Diagnosis not present

## 2015-07-11 DIAGNOSIS — J449 Chronic obstructive pulmonary disease, unspecified: Secondary | ICD-10-CM | POA: Insufficient documentation

## 2015-07-11 DIAGNOSIS — N186 End stage renal disease: Secondary | ICD-10-CM | POA: Insufficient documentation

## 2015-07-11 DIAGNOSIS — I69998 Other sequelae following unspecified cerebrovascular disease: Secondary | ICD-10-CM | POA: Diagnosis not present

## 2015-07-11 DIAGNOSIS — E785 Hyperlipidemia, unspecified: Secondary | ICD-10-CM | POA: Insufficient documentation

## 2015-07-11 DIAGNOSIS — R1310 Dysphagia, unspecified: Secondary | ICD-10-CM | POA: Diagnosis not present

## 2015-07-11 DIAGNOSIS — D509 Iron deficiency anemia, unspecified: Secondary | ICD-10-CM | POA: Diagnosis not present

## 2015-07-11 DIAGNOSIS — E11319 Type 2 diabetes mellitus with unspecified diabetic retinopathy without macular edema: Secondary | ICD-10-CM | POA: Diagnosis not present

## 2015-07-11 DIAGNOSIS — Z87891 Personal history of nicotine dependence: Secondary | ICD-10-CM | POA: Diagnosis not present

## 2015-07-11 DIAGNOSIS — Z6828 Body mass index (BMI) 28.0-28.9, adult: Secondary | ICD-10-CM | POA: Insufficient documentation

## 2015-07-11 DIAGNOSIS — I63011 Cerebral infarction due to thrombosis of right vertebral artery: Secondary | ICD-10-CM | POA: Diagnosis present

## 2015-07-11 DIAGNOSIS — H510 Palsy (spasm) of conjugate gaze: Principal | ICD-10-CM | POA: Insufficient documentation

## 2015-07-11 DIAGNOSIS — B1920 Unspecified viral hepatitis C without hepatic coma: Secondary | ICD-10-CM | POA: Insufficient documentation

## 2015-07-11 DIAGNOSIS — R6889 Other general symptoms and signs: Secondary | ICD-10-CM | POA: Diagnosis not present

## 2015-07-11 DIAGNOSIS — I5042 Chronic combined systolic (congestive) and diastolic (congestive) heart failure: Secondary | ICD-10-CM | POA: Insufficient documentation

## 2015-07-11 LAB — COMPREHENSIVE METABOLIC PANEL
ALT: 16 U/L — ABNORMAL LOW (ref 17–63)
ANION GAP: 10 (ref 5–15)
AST: 19 U/L (ref 15–41)
Albumin: 3.4 g/dL — ABNORMAL LOW (ref 3.5–5.0)
Alkaline Phosphatase: 54 U/L (ref 38–126)
BILIRUBIN TOTAL: 0.4 mg/dL (ref 0.3–1.2)
BUN: 14 mg/dL (ref 6–20)
CO2: 37 mmol/L — ABNORMAL HIGH (ref 22–32)
Calcium: 8.6 mg/dL — ABNORMAL LOW (ref 8.9–10.3)
Chloride: 95 mmol/L — ABNORMAL LOW (ref 101–111)
Creatinine, Ser: 5.63 mg/dL — ABNORMAL HIGH (ref 0.61–1.24)
GFR calc Af Amer: 11 mL/min — ABNORMAL LOW (ref >60)
GFR, EST NON AFRICAN AMERICAN: 9 mL/min — AB (ref >60)
Glucose, Bld: 100 mg/dL — ABNORMAL HIGH (ref 65–99)
POTASSIUM: 3.2 mmol/L — AB (ref 3.5–5.1)
Sodium: 142 mmol/L (ref 135–145)
TOTAL PROTEIN: 6.5 g/dL (ref 6.5–8.1)

## 2015-07-11 LAB — CBC
HCT: 38.5 % — ABNORMAL LOW (ref 39.0–52.0)
Hemoglobin: 13.1 g/dL (ref 13.0–17.0)
MCH: 28.9 pg (ref 26.0–34.0)
MCHC: 34 g/dL (ref 30.0–36.0)
MCV: 85 fL (ref 78.0–100.0)
PLATELETS: ADEQUATE 10*3/uL (ref 150–400)
RBC: 4.53 MIL/uL (ref 4.22–5.81)
RDW: 14.9 % (ref 11.5–15.5)
WBC: 6.6 10*3/uL (ref 4.0–10.5)

## 2015-07-11 LAB — DIFFERENTIAL
Basophils Absolute: 0.1 10*3/uL (ref 0.0–0.1)
Basophils Relative: 1 %
EOS ABS: 0.2 10*3/uL (ref 0.0–0.7)
Eosinophils Relative: 3 %
LYMPHS ABS: 1.5 10*3/uL (ref 0.7–4.0)
Lymphocytes Relative: 22 %
MONO ABS: 0.7 10*3/uL (ref 0.1–1.0)
Monocytes Relative: 10 %
NEUTROS ABS: 4.1 10*3/uL (ref 1.7–7.7)
NEUTROS PCT: 64 %

## 2015-07-11 LAB — I-STAT CHEM 8, ED
BUN: 22 mg/dL — ABNORMAL HIGH (ref 6–20)
Calcium, Ion: 0.96 mmol/L — ABNORMAL LOW (ref 1.13–1.30)
Chloride: 93 mmol/L — ABNORMAL LOW (ref 101–111)
Creatinine, Ser: 5.1 mg/dL — ABNORMAL HIGH (ref 0.61–1.24)
Glucose, Bld: 101 mg/dL — ABNORMAL HIGH (ref 65–99)
HEMATOCRIT: 40 % (ref 39.0–52.0)
HEMOGLOBIN: 13.6 g/dL (ref 13.0–17.0)
POTASSIUM: 3.9 mmol/L (ref 3.5–5.1)
SODIUM: 140 mmol/L (ref 135–145)
TCO2: 37 mmol/L (ref 0–100)

## 2015-07-11 LAB — GLUCOSE, CAPILLARY
GLUCOSE-CAPILLARY: 74 mg/dL (ref 65–99)
Glucose-Capillary: 72 mg/dL (ref 65–99)

## 2015-07-11 LAB — ETHANOL: Alcohol, Ethyl (B): <5 mg/dL (ref <5)

## 2015-07-11 LAB — I-STAT TROPONIN, ED: TROPONIN I, POC: 0.02 ng/mL (ref 0.00–0.08)

## 2015-07-11 LAB — PROTIME-INR
INR: 0.98 (ref 0.00–1.49)
PROTHROMBIN TIME: 13.2 s (ref 11.6–15.2)

## 2015-07-11 LAB — APTT: aPTT: 20 seconds — ABNORMAL LOW (ref 24–37)

## 2015-07-11 MED ORDER — HYDRALAZINE HCL 20 MG/ML IJ SOLN: 10.0000 mg | Freq: Once | INTRAMUSCULAR | Status: DC

## 2015-07-11 MED ORDER — CINACALCET HCL 30 MG PO TABS
60.0000 mg | ORAL_TABLET | Freq: Two times a day (BID) | ORAL | Status: DC
  Administered 2015-07-12 (×2): 60 mg via ORAL
  Filled 2015-07-11 (×5): qty 2

## 2015-07-11 MED ORDER — ALLOPURINOL 100 MG PO TABS
100.0000 mg | ORAL_TABLET | Freq: Every day | ORAL | Status: DC
  Administered 2015-07-12 – 2015-07-13 (×2): 100 mg via ORAL
  Filled 2015-07-11 (×2): qty 1

## 2015-07-11 MED ORDER — INSULIN ASPART 100 UNIT/ML ~~LOC~~ SOLN
0.0000 [IU] | Freq: Three times a day (TID) | SUBCUTANEOUS | Status: DC
Start: 2015-07-12 — End: 2015-07-13
  Administered 2015-07-12: 1 [IU] via SUBCUTANEOUS
  Administered 2015-07-13: 2 [IU] via SUBCUTANEOUS

## 2015-07-11 MED ORDER — SENNOSIDES-DOCUSATE SODIUM 8.6-50 MG PO TABS
1.0000 | ORAL_TABLET | Freq: Every evening | ORAL | Status: DC | PRN
  Filled 2015-07-11: qty 1

## 2015-07-11 MED ORDER — SODIUM CHLORIDE 0.9 % IV SOLN
1000.0000 mg | INTRAVENOUS | Status: DC
  Administered 2015-07-11: 1000 mg via INTRAVENOUS
  Filled 2015-07-11 (×2): qty 10

## 2015-07-11 MED ORDER — HEPARIN SODIUM (PORCINE) 5000 UNIT/ML IJ SOLN
5000.0000 [IU] | Freq: Three times a day (TID) | INTRAMUSCULAR | Status: DC
  Administered 2015-07-11 – 2015-07-13 (×7): 5000 [IU] via SUBCUTANEOUS
  Filled 2015-07-11 (×7): qty 1

## 2015-07-11 MED ORDER — SEVELAMER CARBONATE 800 MG PO TABS: 1600.0000 mg | ORAL_TABLET | Freq: Two times a day (BID) | ORAL | Status: DC | PRN

## 2015-07-11 MED ORDER — STROKE: EARLY STAGES OF RECOVERY BOOK
Freq: Once | Status: AC
  Administered 2015-07-11: 1
  Filled 2015-07-11: qty 1

## 2015-07-11 MED ORDER — RENA-VITE PO TABS
1.0000 | ORAL_TABLET | Freq: Every day | ORAL | Status: DC
  Administered 2015-07-12: 1 via ORAL
  Filled 2015-07-11: qty 1

## 2015-07-11 MED ORDER — ASPIRIN 300 MG RE SUPP: 300.0000 mg | Freq: Once | RECTAL | Status: DC

## 2015-07-11 MED ORDER — COLCHICINE 0.6 MG PO TABS
0.6000 mg | ORAL_TABLET | Freq: Every day | ORAL | Status: DC
  Administered 2015-07-12 – 2015-07-13 (×2): 0.6 mg via ORAL
  Filled 2015-07-11 (×2): qty 1

## 2015-07-11 MED ORDER — CALCIUM ACETATE (PHOS BINDER) 667 MG PO CAPS
2001.0000 mg | ORAL_CAPSULE | Freq: Three times a day (TID) | ORAL | Status: DC
  Administered 2015-07-12 – 2015-07-13 (×4): 2001 mg via ORAL
  Filled 2015-07-11 (×7): qty 3

## 2015-07-11 MED ORDER — ASPIRIN EC 81 MG PO TBEC
81.0000 mg | DELAYED_RELEASE_TABLET | Freq: Every day | ORAL | Status: DC
  Administered 2015-07-12 – 2015-07-13 (×2): 81 mg via ORAL
  Filled 2015-07-11 (×2): qty 1

## 2015-07-11 MED ORDER — INSULIN NPH (HUMAN) (ISOPHANE) 100 UNIT/ML ~~LOC~~ SUSP
8.0000 [IU] | Freq: Two times a day (BID) | SUBCUTANEOUS | Status: DC
  Administered 2015-07-11 – 2015-07-12 (×3): 8 [IU] via SUBCUTANEOUS
  Filled 2015-07-11: qty 10

## 2015-07-11 MED ORDER — SEVELAMER CARBONATE 800 MG PO TABS
3200.0000 mg | ORAL_TABLET | Freq: Three times a day (TID) | ORAL | Status: DC
  Administered 2015-07-12 – 2015-07-13 (×4): 3200 mg via ORAL
  Filled 2015-07-11 (×4): qty 4

## 2015-07-11 MED ORDER — SEVELAMER CARBONATE 800 MG PO TABS: 800.0000 mg | ORAL_TABLET | Freq: Every day | ORAL | Status: DC

## 2015-07-11 MED ORDER — AMITRIPTYLINE HCL 25 MG PO TABS
25.0000 mg | ORAL_TABLET | Freq: Every day | ORAL | Status: DC
  Administered 2015-07-12: 25 mg via ORAL
  Filled 2015-07-11: qty 1

## 2015-07-11 MED ORDER — SIMVASTATIN 20 MG PO TABS
20.0000 mg | ORAL_TABLET | Freq: Every day | ORAL | Status: DC
  Administered 2015-07-12: 20 mg via ORAL
  Filled 2015-07-11: qty 1

## 2015-07-11 MED ORDER — ASPIRIN EC 81 MG PO TBEC: 81.0000 mg | DELAYED_RELEASE_TABLET | Freq: Every day | ORAL | Status: DC

## 2015-07-11 MED ORDER — CLOPIDOGREL BISULFATE 75 MG PO TABS
75.0000 mg | ORAL_TABLET | Freq: Every day | ORAL | Status: DC
  Administered 2015-07-12 – 2015-07-13 (×2): 75 mg via ORAL
  Filled 2015-07-11 (×2): qty 1

## 2015-07-11 MED ORDER — LEVETIRACETAM 500 MG PO TABS: 1000.0000 mg | ORAL_TABLET | Freq: Every day | ORAL | Status: DC

## 2015-07-11 MED ORDER — LISINOPRIL 20 MG PO TABS: 20.0000 mg | ORAL_TABLET | Freq: Every day | ORAL | Status: DC

## 2015-07-11 MED ORDER — CALCIUM ACETATE 667 MG PO TABS: 2001.0000 | ORAL_TABLET | Freq: Three times a day (TID) | ORAL | Status: DC

## 2015-07-11 NOTE — ED Notes (Signed)
Returned from [place]. IV team at bedside removing dialysis access.

## 2015-07-11 NOTE — ED Notes (Signed)
PT to MRI at this time.  Family waiting in room

## 2015-07-11 NOTE — ED Provider Notes (Addendum)
CSN: [id number]     Arrival date & time 07/11/15  1235 History   First MD Initiated Contact with Patient 07/11/15 1235     Chief Complaint  Patient presents with  . Eye Problem     (Consider location/radiation/quality/duration/timing/severity/associated sxs/prior Treatment) HPI Comments: Patient states that his visual abnormality has not changed since getting up at 2 AM to go to the bathroom. He states he is running into things and has double vision. He denies headache, unilateral numbness or weakness, speech difficulty or swallowing difficulty. No numbness to the face.  Patient is a 69 y.o. male presenting with eye problem. The history is provided by the patient.  Eye Problem Location:  Both Quality: no pain. Severity:  Severe Onset quality:  Sudden Duration: started between when he went to bed and 2 am when he awoke to go to the bathroom. Timing:  Constant Progression:  Unchanged Chronicity:  New Relieved by:  Closing eye Worsened by:  Nothing tried Ineffective treatments:  None tried Associated symptoms: double vision   Associated symptoms: no blurred vision, no decreased vision and no redness     Past Medical History  Diagnosis Date  . Hyperlipidemia   . Retinopathy   . Gout   . ESRD (end stage renal disease)     Started HD in [place] in 2009, ESRD was due to DM. Moved to [facility] in Dec 2009 and now gets dialysis at [facility] on a MWF schedule.     . Type II or unspecified type diabetes mellitus without mention of complication, not stated as uncontrolled     adult onset  . Erectile dysfunction     penile implant  . Abnormal stress test     s/p cath November 2013 with modest disease involving the ostial left main, proximal LAD, proximal RCA - do not appear to be hemodynamically signficant; mild LV dysfunction  . Obesity   . Hypertension   . Hepatitis C   . COPD (chronic obstructive pulmonary disease)   . Coronary artery disease     per cath report 2013  . Ejection  fraction < 50%   . Chronic systolic CHF (congestive heart failure)   . Bacteremia   . Shortness of breath     Hx: of with exertion  . Gout     Hx: of  . Stroke    Past Surgical History  Procedure Laterality Date  . Penile prosthesis implant    . Fistula      RUE and wrist  . Cardiac catheterization  August 26, 2012  . Eye surgery      laser.  and surgery for DM  . Insertion of dialysis catheter Right 01/01/2013    Procedure: INSERTION OF DIALYSIS CATHETER right  internal jugular;  Surgeon: Serafina Mitchell, MD;  Location: [place];  Service: Vascular;  Laterality: Right;  . Av fistula placement Left 01/13/2013    Procedure: INSERTION OF ARTERIOVENOUS (AV) GORE-TEX GRAFT ARM;  Surgeon: Angelia Mould, MD;  Location: [place];  Service: Vascular;  Laterality: Left;  . Thrombectomy w/ embolectomy Left 03/26/2013    Procedure: Attempted thrombectomy of left arm arteriovenous goretex graft.;  Surgeon: Angelia Mould, MD;  Location: [place];  Service: Vascular;  Laterality: Left;  . Insertion of dialysis catheter N/A 03/26/2013    Procedure: INSERTION OF DIALYSIS CATHETER Left internal jugular vein;  Surgeon: Angelia Mould, MD;  Location: [place];  Service: Vascular;  Laterality: N/A;  . Avgg removal  Left 03/26/2013    Procedure: REMOVAL OF  NON INCORPORATED ARTERIOVENOUS GORETEX GRAFT ([place]) left arm * repair of  left brachial artery with vein patch angioplasty.;  Surgeon: Angelia Mould, MD;  Location: [place];  Service: Vascular;  Laterality: Left;  . Av fistula placement Left 05/05/2013    Procedure: INSERTION OF LEFT UPPER ARM  ARTERIOVENOUS GORTEX GRAFT;  Surgeon: Angelia Mould, MD;  Location: [place];  Service: Vascular;  Laterality: Left;  . Tee without cardioversion N/A 06/11/2013    Procedure: TRANSESOPHAGEAL ECHOCARDIOGRAM (TEE);  Surgeon: Larey Dresser, MD;  Location: [place];  Service: Cardiovascular;  Laterality: N/A;  . Embolectomy Right 08/27/2013     Procedure: EMBOLECTOMY;  Surgeon: Mal Misty, MD;  Location: [place];  Service: Vascular;  Laterality: Right;  Thrombectomy of Radial and ulnar artery.  . Cataract extraction w/ intraocular lens  implant, bilateral    . Colonoscopy    . Ligation of arteriovenous  fistula Right 12/30/2013    Procedure: REMOVAL OF SEGMENT OF GORTEX GRAFT AND FISTULA  AND REPAIR OF BRACHIAL ARTERY;  Surgeon: Mal Misty, MD;  Location: [place];  Service: Vascular;  Laterality: Right;  Annell Greening N/A 04/07/2013    Procedure: VENOGRAM;  Surgeon: Serafina Mitchell, MD;  Location: [facility] CATH LAB;  Service: Cardiovascular;  Laterality: N/A;   Family History  Problem Relation Age of Onset  . Heart disease Father   . Diabetes Sister   . Diabetes Brother   . Diabetes Son    Social History  Substance Use Topics  . Smoking status: Former Smoker -- 7 years    Quit date: 06/10/1973  . Smokeless tobacco: Never Used  . Alcohol Use: No    Review of Systems  Eyes: Positive for double vision. Negative for blurred vision and redness.  All other systems reviewed and are negative.     Allergies  Review of patient's allergies indicates no known allergies.  Home Medications   Prior to Admission medications   Medication Sig Start Date End Date Taking? Authorizing Provider  allopurinol (ZYLOPRIM) 100 MG tablet Take 100 mg by mouth daily.  12/13/14   Historical Provider, MD  amitriptyline (ELAVIL) 25 MG tablet TAKE ONE TABLET BY MOUTH AT BEDTIME FOR  NEUROPATHY Patient taking differently: TAKE ONE TABLET BY MOUTH AT BEDTIME AS NEED  FOR  NEUROPATHY 03/18/14   Bronson Ing, DPM  aspirin EC 81 MG tablet Take 81 mg by mouth daily. 06/18/13   Carlena Bjornstad, MD  Calcium Acetate 667 MG TABS Take 2,001 tablets by mouth 3 (three) times daily with meals.     Historical Provider, MD  cinacalcet (SENSIPAR) 60 MG tablet Take 60 mg by mouth 2 (two) times daily.     Historical Provider, MD  clopidogrel (PLAVIX) 75 MG tablet Take 75 mg  by mouth daily.     Historical Provider, MD  colchicine 0.6 MG tablet Take 0.6 mg by mouth daily.  12/13/14   Historical Provider, MD  insulin NPH Human (HUMULIN N,NOVOLIN N) 100 UNIT/ML injection Inject 0.1 mLs (10 Units total) into the skin 2 (two) times daily. 04/30/15   Corky Sox, MD  levETIRAcetam (KEPPRA) 500 MG tablet Take 2 tablets (1,000 mg total) by mouth daily. Then take extra 500 mg (1 tablet) on MWF after HD 04/30/15   Corky Sox, MD  lidocaine-prilocaine (EMLA) cream Apply 1 application topically as needed (dialysis).    Historical Provider, MD  lisinopril (PRINIVIL,ZESTRIL)  20 MG tablet Take 20 mg by mouth daily.    Historical Provider, MD  metoprolol (LOPRESSOR) 50 MG tablet Take 50 mg by mouth daily.  01/27/15   Historical Provider, MD  multivitamin (RENA-VIT) TABS tablet Take 1 tablet by mouth daily.     Historical Provider, MD  sevelamer (RENVELA) 800 MG tablet Take 800-3,200 mg by mouth 5 (five) times daily. 4 tablets three times daily with meals and 1 tablet twice daily with snacks.    Historical Provider, MD  simvastatin (ZOCOR) 20 MG tablet Take 20 mg by mouth daily at 6 PM.     Historical Provider, MD   BP 190/74 mmHg  Pulse 58  Temp(Src) 97.6 F (36.4 C) (Oral)  Resp 18  Ht 6' (1.829 m)  Wt 225 lb (102.059 kg)  BMI 30.51 kg/m2  SpO2 95% Physical Exam  Constitutional: He is oriented to person, place, and time. He appears well-developed and well-nourished. No distress.  HENT:  Head: Normocephalic and atraumatic.  Mouth/Throat: Oropharynx is clear and moist.  Eyes: Conjunctivae are normal. Pupils are equal, round, and reactive to light.  Right eye is downward and laterally deviated but is able to move in all directions when asked with mild lid drooping.  Left eye is pinpoint unreactive pupil that is unable to move medially but can move in all other directions.  Normal vision when only 1 eye is open  Neck: Normal range of motion. Neck supple.  Cardiovascular: Normal  rate, regular rhythm and intact distal pulses.   No murmur heard. Pulmonary/Chest: Effort normal and breath sounds normal. No respiratory distress. He has no wheezes. He has no rales.  Abdominal: Soft. He exhibits no distension. There is no tenderness. There is no rebound and no guarding.  Musculoskeletal: Normal range of motion. He exhibits no edema or tenderness.  AV fistula present in the left arm which is currently cannulated  Neurological: He is alert and oriented to person, place, and time. He has normal strength. No sensory deficit. Coordination normal.  5 out of 5 motor strength in all extremities. Normal heel-to-shin testing.  Patient not ambulated due to diplopia  Skin: Skin is warm and dry. No rash noted. No erythema.  Psychiatric: He has a normal mood and affect. His behavior is normal.  Nursing note and vitals reviewed.   ED Course  Procedures (including critical care time) Labs Review Labs Reviewed  APTT - Abnormal; Notable for the following:    aPTT 20 (*)    All other components within normal limits  CBC - Abnormal; Notable for the following:    HCT 38.5 (*)    All other components within normal limits  I-STAT CHEM 8, ED - Abnormal; Notable for the following:    Chloride 93 (*)    BUN 22 (*)    Creatinine, Ser 5.10 (*)    Glucose, Bld 101 (*)    Calcium, Ion 0.96 (*)    All other components within normal limits  PROTIME-INR  DIFFERENTIAL  ETHANOL  URINE RAPID DRUG SCREEN, HOSP PERFORMED  URINALYSIS, ROUTINE W REFLEX MICROSCOPIC (NOT AT [facility])  COMPREHENSIVE METABOLIC PANEL  I-STAT TROPOININ, ED    Imaging Review Ct Head Wo Contrast  07/11/2015   CLINICAL DATA:  Intracranial hemorrhage. LEFT pupil nonreactive. Stroke with speech changes.  EXAM: CT HEAD WITHOUT CONTRAST  TECHNIQUE: Contiguous axial images were obtained from the base of the skull through the vertex without intravenous contrast.  COMPARISON:  04/25/2015.  FINDINGS: No mass lesion,  mass effect,  midline shift, hydrocephalus, hemorrhage. No acute territorial cortical ischemia/infarct. Atrophy and chronic ischemic white matter disease is present. Benign basal ganglia calcifications along with scattered parenchymal calcifications. These are chronic findings and appears similar to the prior exam. Old lacunar infarct in the RIGHT cerebellar hemisphere. Visible paranasal sinuses appear normal. Dense intracranial atherosclerosis.  IMPRESSION: Atrophy, chronic ischemic white matter disease and old lacunar infarcts without acute intracranial abnormality. No interval change from 04/25/2015.   Electronically Signed   By: Dereck Ligas M.D.   On: 07/11/2015 13:49   I have personally reviewed and evaluated these images and lab results as part of my medical decision-making.   EKG Interpretation   Date/Time:  Monday July 11 2015 12:53:29 EDT Ventricular Rate:  54 PR Interval:  179 QRS Duration: 109 QT Interval:  504 QTC Calculation: 478 R Axis:   -9 Text Interpretation:  Sinus rhythm Atrial premature complexes Abnormal  R-wave progression, late transition LVH with secondary repolarization  abnormality Borderline prolonged QT interval No significant change since  last tracing Confirmed by Maryan Rued  MD, Loree Fee ([id number]) on 07/11/2015  2:38:56 PM      MDM   Final diagnoses:  Right pontine stroke    Patient presents with diplopia and difficulty walking since 2 AM this morning. Patient went to bed normal and woke up at 2 AM with this deficit. He received a complete course of dialysis this morning and then they sent him here for further care. He denies any head trauma or headaches. On exam patient has a third nerve palsy as well as a deficit in the left eye concerning for a pontine stroke. He is not in the window for TPA as his symptoms started approximately 12 hours ago.   Patient does have a prior history of stroke as well as hypertension currently. Neurology saw the patient and agree that he  is having a stroke. They recommended MRI/MRA. CT of the head was negative for hemorrhage. Patient was given 325 aspirin. Will admit for stroke workup.    Blanchie Dessert, MD 07/11/15 1455  Blanchie Dessert, MD 07/11/15 [phone]

## 2015-07-11 NOTE — ED Notes (Signed)
Received call from MRI st's pt has a penile implant and does not have a card.  Dr. Maryan Rued made aware

## 2015-07-11 NOTE — Progress Notes (Signed)
Left upper arm graft deaccesses, pressure drsg applied for 20 min. No active bleeding noted, RN made aware.

## 2015-07-11 NOTE — Consult Note (Signed)
Referring Physician: Leonel Ramsay    Chief Complaint: ocular movement abnormality   HPI:                                                                                                                                         Ernest [name] is an 69 y.o. male male with multiple stroke risk factors. HE states he went to sleep last night normal. He awoke at 0200 and noted he was seeing diplopia and tended to bump in to the wall.  He went to dialysis and due to continued diplopia EMS was called and he was brought to hospital after his treatment. Currently he has no complaints of HA but continues to have double vision  He also has dysarthria but this is secondary to previous stroke. Initial exam per ED MD showed a bilateral INO like picture but at time of neurology consultation he was unable to move his left eye and only able to laterally deviate his right eye. patient to go for stat CT head.   Date last known well: Date: 07/11/2015 Time last known well: Time: 02:00 tPA Given: No: out of window     Past Medical History  Diagnosis Date  . Hyperlipidemia   . Retinopathy   . Gout   . ESRD (end stage renal disease)     Started HD in [place] in 2009, ESRD was due to DM. Moved to [facility] in Dec 2009 and now gets dialysis at [facility] on a MWF schedule.     . Type II or unspecified type diabetes mellitus without mention of complication, not stated as uncontrolled     adult onset  . Erectile dysfunction     penile implant  . Abnormal stress test     s/p cath November 2013 with modest disease involving the ostial left main, proximal LAD, proximal RCA - do not appear to be hemodynamically signficant; mild LV dysfunction  . Obesity   . Hypertension   . Hepatitis C   . COPD (chronic obstructive pulmonary disease)   . Coronary artery disease     per cath report 2013  . Ejection fraction < 50%   . Chronic systolic CHF (congestive heart failure)   . Bacteremia   . Shortness of breath     Hx: of with  exertion  . Gout     Hx: of  . Stroke     Past Surgical History  Procedure Laterality Date  . Penile prosthesis implant    . Fistula      RUE and wrist  . Cardiac catheterization  August 26, 2012  . Eye surgery      laser.  and surgery for DM  . Insertion of dialysis catheter Right 01/01/2013    Procedure: INSERTION OF DIALYSIS CATHETER right  internal jugular;  Surgeon: Serafina Mitchell, MD;  Location: [place];  Service: Vascular;  Laterality:  Right;  . Av fistula placement Left 01/13/2013    Procedure: INSERTION OF ARTERIOVENOUS (AV) GORE-TEX GRAFT ARM;  Surgeon: Angelia Mould, MD;  Location: [place];  Service: Vascular;  Laterality: Left;  . Thrombectomy w/ embolectomy Left 03/26/2013    Procedure: Attempted thrombectomy of left arm arteriovenous goretex graft.;  Surgeon: Angelia Mould, MD;  Location: [place];  Service: Vascular;  Laterality: Left;  . Insertion of dialysis catheter N/A 03/26/2013    Procedure: INSERTION OF DIALYSIS CATHETER Left internal jugular vein;  Surgeon: Angelia Mould, MD;  Location: [place];  Service: Vascular;  Laterality: N/A;  . Avgg removal Left 03/26/2013    Procedure: REMOVAL OF  NON INCORPORATED ARTERIOVENOUS GORETEX GRAFT ([place]) left arm * repair of  left brachial artery with vein patch angioplasty.;  Surgeon: Angelia Mould, MD;  Location: [place];  Service: Vascular;  Laterality: Left;  . Av fistula placement Left 05/05/2013    Procedure: INSERTION OF LEFT UPPER ARM  ARTERIOVENOUS GORTEX GRAFT;  Surgeon: Angelia Mould, MD;  Location: [place];  Service: Vascular;  Laterality: Left;  . Tee without cardioversion N/A 06/11/2013    Procedure: TRANSESOPHAGEAL ECHOCARDIOGRAM (TEE);  Surgeon: Larey Dresser, MD;  Location: [place];  Service: Cardiovascular;  Laterality: N/A;  . Embolectomy Right 08/27/2013    Procedure: EMBOLECTOMY;  Surgeon: Mal Misty, MD;  Location: [place];  Service: Vascular;  Laterality: Right;  Thrombectomy  of Radial and ulnar artery.  . Cataract extraction w/ intraocular lens  implant, bilateral    . Colonoscopy    . Ligation of arteriovenous  fistula Right 12/30/2013    Procedure: REMOVAL OF SEGMENT OF GORTEX GRAFT AND FISTULA  AND REPAIR OF BRACHIAL ARTERY;  Surgeon: Mal Misty, MD;  Location: [place];  Service: Vascular;  Laterality: Right;  Annell Greening N/A 04/07/2013    Procedure: VENOGRAM;  Surgeon: Serafina Mitchell, MD;  Location: [facility] CATH [facility];  Service: Cardiovascular;  Laterality: N/A;    Family History  Problem Relation Age of Onset  . Heart disease Father   . Diabetes Sister   . Diabetes Brother   . Diabetes Son    Social History:  reports that he quit smoking about 42 years ago. He has never used smokeless tobacco. He reports that he does not drink alcohol or use illicit drugs.  Allergies: [name]  Medications:                                                                                                                           No current facility-administered medications for this encounter.   Current Outpatient Prescriptions  Medication Sig Dispense Refill  . allopurinol (ZYLOPRIM) 100 MG tablet Take 100 mg by mouth daily.     [name] amitriptyline (ELAVIL) 25 MG tablet TAKE ONE TABLET BY MOUTH AT BEDTIME FOR  NEUROPATHY (Patient taking differently: TAKE ONE TABLET BY MOUTH AT BEDTIME AS NEED  FOR  NEUROPATHY)  90 tablet 0  . aspirin EC 81 MG tablet Take 81 mg by mouth daily.    . Calcium Acetate 667 MG TABS Take 2,001 tablets by mouth 3 (three) times daily with meals.     . cinacalcet (SENSIPAR) 60 MG tablet Take 60 mg by mouth 2 (two) times daily.     . clopidogrel (PLAVIX) 75 MG tablet Take 75 mg by mouth daily.     . colchicine 0.6 MG tablet Take 0.6 mg by mouth daily.     . insulin NPH Human (HUMULIN N,NOVOLIN N) 100 UNIT/ML injection Inject 0.1 mLs (10 Units total) into the skin 2 (two) times daily. 10 mL 11  . levETIRAcetam (KEPPRA) 500 MG tablet Take 2 tablets  (1,000 mg total) by mouth daily. Then take extra 500 mg (1 tablet) on MWF after HD 72 tablet 5  . lidocaine-prilocaine (EMLA) cream Apply 1 application topically as needed (dialysis).    [name] lisinopril (PRINIVIL,ZESTRIL) 20 MG tablet Take 20 mg by mouth daily.    . metoprolol (LOPRESSOR) 50 MG tablet Take 50 mg by mouth daily.     . multivitamin (RENA-VIT) TABS tablet Take 1 tablet by mouth daily.     . sevelamer (RENVELA) 800 MG tablet Take 800-3,200 mg by mouth 5 (five) times daily. 4 tablets three times daily with meals and 1 tablet twice daily with snacks.    . simvastatin (ZOCOR) 20 MG tablet Take 20 mg by mouth daily at 6 PM.        ROS:                                                                                                                                       History obtained from the patient  General ROS: negative for - chills, fatigue, fever, night sweats, weight gain or weight loss Psychological ROS: negative for - behavioral disorder, hallucinations, memory difficulties, mood swings or suicidal ideation Ophthalmic ROS: negative for - blurry vision, double vision, eye pain or loss of vision ENT ROS: negative for - epistaxis, nasal discharge, oral lesions, sore throat, tinnitus or vertigo Allergy and Immunology ROS: negative for - hives or itchy/watery eyes Hematological and Lymphatic ROS: negative for - bleeding problems, bruising or swollen lymph nodes Endocrine ROS: negative for - galactorrhea, hair pattern changes, polydipsia/polyuria or temperature intolerance Respiratory ROS: negative for - cough, hemoptysis, shortness of breath or wheezing Cardiovascular ROS: negative for - chest pain, dyspnea on exertion, edema or irregular heartbeat Gastrointestinal ROS: negative for - abdominal pain, diarrhea, hematemesis, nausea/vomiting or stool incontinence Genito-Urinary ROS: negative for - dysuria, hematuria, incontinence or urinary frequency/urgency Musculoskeletal ROS:  negative for - joint swelling or muscular weakness Neurological ROS: as noted in HPI Dermatological ROS: negative for rash and skin lesion changes  Neurologic Examination:  Blood pressure 190/74, pulse 58, temperature 97.6 F (36.4 C), temperature source Oral, resp. rate 18, height 6' (1.829 m), weight 102.059 kg (225 lb), SpO2 95 %.  HEENT-  Normocephalic, no lesions, without obvious abnormality.  Normal external eye and conjunctiva.  Normal TM's bilaterally.  Normal auditory canals and external ears. Normal external nose, mucus membranes and septum.  Normal pharynx. Cardiovascular- S1, S2 normal, pulses palpable throughout   Lungs- chest clear, no wheezing, rales, normal symmetric air entry Abdomen- normal findings: bowel sounds normal Extremities- edema of LE and skin breakdown.  Lymph-no adenopathy palpable Musculoskeletal-no joint tenderness, deformity or swelling Skin-warm and dry, no hyperpigmentation, vitiligo, or suspicious lesions  Neurological Examination Mental Status: Alert, oriented, thought content appropriate.  Speech dysarthric without evidence of aphasia.  Able to follow 3 step commands without difficulty. Cranial Nerves: II: Discs flat bilaterally; Visual fields grossly normal, pupils equal, round, reactive to light and accommodation III,IV, VI: ptosis not present,OD-- able to laterally deviate and look vertically. OS only vertical movement. Nystagmus noted in the right eye when looking laterally.  V,VII: smile symmetric, facial light touch sensation normal bilaterally VIII: hearing normal bilaterally IX,X: uvula rises symmetrically XI: bilateral shoulder shrug XII: midline tongue extension Motor: Right : Upper extremity   5/5    Left:     Upper extremity   5/5  Lower extremity   5/5     Lower extremity   5/5 Tone and bulk:normal tone throughout; no atrophy  noted Sensory: Pinprick and light touch intact throughout, bilaterally UE with decreased PP/LT and vibration from feet to just below knee Deep Tendon Reflexes: 2+ and symmetric throughout UE and no KJ or AJ Plantars: Mute bilaterally Cerebellar: normal finger-to-nose,and normal heel-to-shin test--difficult for patient with F-N due to diplopia.  Gait: not tested due to safety       Lab Results: Basic Metabolic Panel:  Recent Labs Lab 07/06/15 1538  NA 139  K 3.5  CL 95*  GLUCOSE 95  BUN 24*  CREATININE 5.40*    Liver Function Tests: No results for input(s): AST, ALT, ALKPHOS, BILITOT, PROT, ALBUMIN in the last 168 hours. No results for input(s): LIPASE, AMYLASE in the last 168 hours. No results for input(s): AMMONIA in the last 168 hours.  CBC:  Recent Labs Lab 07/06/15 1538  HGB 13.9  HCT 41.0    Cardiac Enzymes: No results for input(s): CKTOTAL, CKMB, CKMBINDEX, TROPONINI in the last 168 hours.  Lipid Panel: No results for input(s): CHOL, TRIG, HDL, CHOLHDL, VLDL, LDLCALC in the last 168 hours.  CBG: No results for input(s): GLUCAP in the last 168 hours.  Microbiology: Results for orders placed or performed in visit on 07/28/13  Culture, blood (single)     Status: None   Collection Time: 07/28/13 11:53 AM  Result Value Ref Range Status   Organism ID, Bacteria NO GROWTH 5 DAYS  Final  Culture, blood (single)     Status: None   Collection Time: 07/28/13 11:53 AM  Result Value Ref Range Status   Organism ID, Bacteria NO GROWTH 5 DAYS  Final    Coagulation Studies: No results for input(s): LABPROT, INR in the last 72 hours.  Imaging: No results found.     Assessment and plan discussed with with attending physician and they are in agreement.    Etta Quill [name] [facility] [phone]  07/11/2015, 1:18 PM   Assessment: 69 y.o. 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 This is typically a  lesion that causes injury to the brainstem affecting the parapontine reticular formation and causing an internuclear ophthalmoplegia as well. The timing and his risk factors are most consistent with ischemic infarct as the casue.   Stroke Risk Factors - diabetes mellitus, hyperlipidemia and hypertension  1. HgbA1c, fasting lipid panel 2. MRI, MRA  of the brain without contrast 3. Frequent neuro checks 4. Echocardiogram 5. Carotid dopplers 6. Prophylactic therapy-Antiplatelet med: Aspirin - dose 325mg  PO or 300mg  PR 7. Risk factor modification 8. Telemetry monitoring 9. PT consult, OT consult, Speech consult  Roland Rack, MD [facility] [phone]  If 7pm- 7am, please page neurology on call as listed in [place].

## 2015-07-11 NOTE — ED Notes (Signed)
Notified EDP of patient;s blood pressure.

## 2015-07-11 NOTE — ED Notes (Signed)
Attempted IV twice a second nurse at bedside attempted IV access.

## 2015-07-11 NOTE — H&P (Signed)
Date: 07/11/2015               Patient Name:  Ernest [name] MRN: [id number]  DOB: Mar 04, 1946 Age / Sex: 69 y.o., male   PCP: No primary care provider on file.         Medical Service: Internal Medicine Teaching Service         Attending Physician: Dr. Axel Filler, MD    First Contact: MS4 Florentina Addison   Pager: [phone]  Second Contact: Dr. Naaman Plummer Pager: [phone]       After Hours (After 5p/  First Contact Pager: [phone]  weekends / holidays): Second Contact Pager: [phone]   Chief Complaint: Diplopia   History of Present Illness:    Ernest [name]. Ulrich is a 69 year old-man with past medical history of ESRD on HD MWF, hypertension, hyperlipidemia, insulin-dependent Type 2 DM, COPD, ED s/p implant, seizure disorder, CVA, chronic combined CHF, CAD, and gout who presents with chief complaint of diplopia.    He reports that at 2 AM this morning he began to have double vision when trying to use the restroom causing him to bump into the wall. He also had difficulty walking to the bus to go to HD and continued to have diplopia and ataxia after HD at 11 AM at which point he was sent to the ED for further evaluation. He is unsure when he was last well. He reports continued diplopia and left eye blurry vision which improves if he closes his right eye without eye pain or photosensitivity. He has chronic tearing of his eyes. He denies similar symptoms in the past. He denies headache, dizziness, extremity weakness, paraesthesias, facial droop, confusion, or memory loss.   He reports having a stroke in the past with residual dysarthria and dysphagia. He is supposed to be taking aspirin 81 mg daily and plavix 75 mg daily but per wife ran out of out aspirin for the past few weeks. He denies family history of stroke. He used to smoke 2 cigars a day for 6 years but quit in the 1970's. He reports compliance with taking his anti-hypertensive and anti-hyperlipidemia medications. He has not taken his  anti-hypertensive medications today after HD.       Meds: No current facility-administered medications for this encounter.   Current Outpatient Prescriptions  Medication Sig Dispense Refill  . allopurinol (ZYLOPRIM) 100 MG tablet Take 100 mg by mouth daily.     [name] amitriptyline (ELAVIL) 25 MG tablet TAKE ONE TABLET BY MOUTH AT BEDTIME FOR  NEUROPATHY (Patient taking differently: TAKE ONE TABLET BY MOUTH AT BEDTIME AS NEED  FOR  NEUROPATHY) 90 tablet 0  . aspirin EC 81 MG tablet Take 81 mg by mouth daily.    . Calcium Acetate 667 MG TABS Take 2,001 tablets by mouth 3 (three) times daily with meals.     . cinacalcet (SENSIPAR) 60 MG tablet Take 60 mg by mouth 2 (two) times daily.     . clopidogrel (PLAVIX) 75 MG tablet Take 75 mg by mouth daily.     . colchicine 0.6 MG tablet Take 0.6 mg by mouth daily.     . insulin NPH Human (HUMULIN N,NOVOLIN N) 100 UNIT/ML injection Inject 0.1 mLs (10 Units total) into the skin 2 (two) times daily. 10 mL 11  . levETIRAcetam (KEPPRA) 500 MG tablet Take 2 tablets (1,000 mg total) by mouth daily. Then take extra 500 mg (1 tablet) on MWF after HD 72 tablet 5  . lidocaine-prilocaine (  EMLA) cream Apply 1 application topically as needed (dialysis).    [name] lisinopril (PRINIVIL,ZESTRIL) 20 MG tablet Take 20 mg by mouth daily.    . metoprolol (LOPRESSOR) 50 MG tablet Take 50 mg by mouth daily.     . multivitamin (RENA-VIT) TABS tablet Take 1 tablet by mouth daily.     . sevelamer (RENVELA) 800 MG tablet Take 800-3,200 mg by mouth 5 (five) times daily. 4 tablets three times daily with meals and 1 tablet twice daily with snacks.    . simvastatin (ZOCOR) 20 MG tablet Take 20 mg by mouth daily at 6 PM.       Allergies: Allergies as of 07/11/2015  . ([name])   Past Medical History  Diagnosis Date  . Hyperlipidemia   . Retinopathy   . Gout   . ESRD (end stage renal disease)     Started HD in [place] in 2009, ESRD was due to DM. Moved to [facility] in Dec  2009 and now gets dialysis at [facility] on a MWF schedule.     . Type II or unspecified type diabetes mellitus without mention of complication, not stated as uncontrolled     adult onset  . Erectile dysfunction     penile implant  . Abnormal stress test     s/p cath November 2013 with modest disease involving the ostial left main, proximal LAD, proximal RCA - do not appear to be hemodynamically signficant; mild LV dysfunction  . Obesity   . Hypertension   . Hepatitis C   . COPD (chronic obstructive pulmonary disease)   . Coronary artery disease     per cath report 2013  . Ejection fraction < 50%   . Chronic systolic CHF (congestive heart failure)   . Bacteremia   . Shortness of breath     Hx: of with exertion  . Gout     Hx: of  . Stroke    Past Surgical History  Procedure Laterality Date  . Penile prosthesis implant      1997.. no card  . Fistula      RUE and wrist  . Cardiac catheterization  August 26, 2012  . Eye surgery      laser.  and surgery for DM  . Insertion of dialysis catheter Right 01/01/2013    Procedure: INSERTION OF DIALYSIS CATHETER right  internal jugular;  Surgeon: Serafina Mitchell, MD;  Location: [place];  Service: Vascular;  Laterality: Right;  . Av fistula placement Left 01/13/2013    Procedure: INSERTION OF ARTERIOVENOUS (AV) GORE-TEX GRAFT ARM;  Surgeon: Angelia Mould, MD;  Location: [place];  Service: Vascular;  Laterality: Left;  . Thrombectomy w/ embolectomy Left 03/26/2013    Procedure: Attempted thrombectomy of left arm arteriovenous goretex graft.;  Surgeon: Angelia Mould, MD;  Location: [place];  Service: Vascular;  Laterality: Left;  . Insertion of dialysis catheter N/A 03/26/2013    Procedure: INSERTION OF DIALYSIS CATHETER Left internal jugular vein;  Surgeon: Angelia Mould, MD;  Location: [place];  Service: Vascular;  Laterality: N/A;  . Avgg removal Left 03/26/2013    Procedure: REMOVAL OF  NON INCORPORATED ARTERIOVENOUS GORETEX  GRAFT ([place]) left arm * repair of  left brachial artery with vein patch angioplasty.;  Surgeon: Angelia Mould, MD;  Location: [place];  Service: Vascular;  Laterality: Left;  . Av fistula placement Left 05/05/2013    Procedure: INSERTION OF LEFT UPPER ARM  ARTERIOVENOUS GORTEX GRAFT;  Surgeon: Angelia Mould, MD;  Location: [place];  Service: Vascular;  Laterality: Left;  . Tee without cardioversion N/A 06/11/2013    Procedure: TRANSESOPHAGEAL ECHOCARDIOGRAM (TEE);  Surgeon: Larey Dresser, MD;  Location: [place];  Service: Cardiovascular;  Laterality: N/A;  . Embolectomy Right 08/27/2013    Procedure: EMBOLECTOMY;  Surgeon: Mal Misty, MD;  Location: [place];  Service: Vascular;  Laterality: Right;  Thrombectomy of Radial and ulnar artery.  . Cataract extraction w/ intraocular lens  implant, bilateral    . Colonoscopy    . Ligation of arteriovenous  fistula Right 12/30/2013    Procedure: REMOVAL OF SEGMENT OF GORTEX GRAFT AND FISTULA  AND REPAIR OF BRACHIAL ARTERY;  Surgeon: Mal Misty, MD;  Location: [place];  Service: Vascular;  Laterality: Right;  Annell Greening N/A 04/07/2013    Procedure: VENOGRAM;  Surgeon: Serafina Mitchell, MD;  Location: [facility] CATH LAB;  Service: Cardiovascular;  Laterality: N/A;   Family History  Problem Relation Age of Onset  . Heart disease Father   . Diabetes Sister   . Diabetes Brother   . Diabetes Son    Social History   Social History  . Marital Status: Married    Spouse Name: N/A  . Number of Children: N/A  . Years of Education: N/A   Occupational History  . 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 Smoking status: Former Smoker -- 7 years    Quit date: 06/10/1973  . Smokeless tobacco: Never Used  . Alcohol Use: No  . Drug Use: No  . Sexual Activity: No   Other Topics Concern  . Not on file   Social History Narrative   Adopted mentally retarded child at home, 2 adopted children, 3 living and 2 deceased.     Review of Systems: Review of Systems - As stated in HPI     Physical Exam: Blood pressure 205/56, pulse 54, temperature 97.6 F (36.4 C), temperature source Oral, resp. rate 25, height 6' (1.829 m), weight 225 lb (102.059 kg), SpO2 100 %.  Constitutional:  Elderly male laying in bed in NAD HENT:  Head: Normocephalic and atraumatic.  Eyes: Conjunctivae are normal. Left eye exhibits discharge (Clear). No scleral icterus.  Left pupil pinpoint and unreactive. Able to count number of fingers appropriately.  Neck: Normal range of motion. No JVD present.  Cardiovascular: Normal rate and regular rhythm. Exam reveals no gallop and no friction rub.  No murmur heard. Pulmonary/Chest: Effort normal. No stridor. No respiratory distress. He has no wheezes. He has no rales.  Abdominal: Bowel sounds are normal. He exhibits no distension. There is no tenderness. There is no rebound and no guarding.  Musculoskeletal: Normal range of motion.  Lymphadenopathy:   He has no cervical adenopathy.  Neurological: He is alert. A cranial nerve deficit (Unable to adduct or abduct left eye. Dysarthria ) is present. Coordination (Pass pointing both hands in setting of diplopia) abnormal. Normal 5/5 muscle strength and sensation to light touch of extremities. No pronator drif present.   Skin: Skin is warm and dry.  Psychiatric: He has a normal mood and affect.     Lab results: Basic Metabolic Panel:  Recent Labs  07/11/15 1400 07/11/15 1402  NA 142 140  K 3.2* 3.9  CL 95* 93*  CO2 37*  --   GLUCOSE 100* 101*  BUN 14 22*  CREATININE 5.63* 5.10*  CALCIUM 8.6*  --  Liver Function Tests:  Recent Labs  07/11/15 1400  AST 19  ALT 16*  ALKPHOS 54  BILITOT 0.4  PROT 6.5  ALBUMIN 3.4*   CBC:  Recent Labs  07/11/15 1357 07/11/15 1402  WBC 6.6  --   NEUTROABS 4.1  --   HGB 13.1 13.6  HCT 38.5* 40.0  MCV 85.0  --   PLT PLATELET CLUMPS NOTED ON SMEAR, COUNT APPEARS ADEQUATE  --     Coagulation:  Recent Labs  07/11/15 1357  LABPROT 13.2  INR 0.98    Alcohol Level:  Recent Labs  07/11/15 1400  ETH <5    Imaging results:  Ct Head Wo Contrast  07/11/2015   CLINICAL DATA:  Intracranial hemorrhage. LEFT pupil nonreactive. Stroke with speech changes.  EXAM: CT HEAD WITHOUT CONTRAST  TECHNIQUE: Contiguous axial images were obtained from the base of the skull through the vertex without intravenous contrast.  COMPARISON:  04/25/2015.  FINDINGS: No mass lesion, mass effect, midline shift, hydrocephalus, hemorrhage. No acute territorial cortical ischemia/infarct. Atrophy and chronic ischemic white matter disease is present. Benign basal ganglia calcifications along with scattered parenchymal calcifications. These are chronic findings and appears similar to the prior exam. Old lacunar infarct in the RIGHT cerebellar hemisphere. Visible paranasal sinuses appear normal. Dense intracranial atherosclerosis.  IMPRESSION: Atrophy, chronic ischemic white matter disease and old lacunar infarcts without acute intracranial abnormality. No interval change from 04/25/2015.   Electronically Signed   By: Dereck Ligas M.D.   On: 07/11/2015 13:49    Other results:  EKG:  Ventricular Rate: 54 PR Interval: 179 QRS Duration: 109 QT Interval: 504 QTC Calculation: 478 R Axis: -9 Text Interpretation: Sinus rhythm Atrial premature complexes Abnormal  R-wave progression, late transition LVH with secondary repolarization  abnormality Borderline prolonged QT interval No significant change since  last tracing    Assessment & Plan by Problem:   Left Eye Horizontal Gaze Palsy & INO in setting of probable pontine CVA - Pt currently unable to abduct or adduct left eye with only vertical movement and persistent diplopia most likely due to pontine CVA. Pt reports compliance with taking plavix but running out of aspirin for the past few weeks. Pt was not in tPA window on arrival. Pt  with multiple risk factors including obesity, HTN, HL, DM, and past history of CVA.  -Appreciate neurology recommendations  -Due to penile implant, MRI brain w/o cont could not be performed, will obtain records  -Monitor on telemetry  -Obtain 2D-echo, carotid dopplers, A1c, lipid panel, and TSH level  -Obtain PT, OT, and SLP consults  -Allow permissive hypertension with BP up to 220/120  -Aspirin 300 rectally then aspirin 81 mg daily and plavix 75 mg daily   Hypertension - BP currently 202/63  -Allow permissive hypertension with BP up to 220/120  -Hold home lisinopril 20 mg daily and lopressor 50 mg daily   ESRD on HD - Last HD session this AM. Pt euvolemic on exam. Chest xray with no pulmonary edema. -Consult nephrology in AM -Monitor renal function panel -Continue home sensipar 60 mg BID, rena-vit daily, phoslo 2001 mg TID with meals, and renvela [phone] mg x5 daily  -Renal diet with fluid restriction once passes swallow evaluation    Insulin-dependent Type 2 DM - Last A1c 6.6 on 06/10/13. Pt at home on insulin NPH 10 U BID.  -Obtain A1c -Reduce home insulin NPH from 10 U BID to 8 U BID -Monitor CBG's before meals and at bedtime  -Continue  amitriptyline 25 mg daily at bedtime for peripheral neuropathy   Hyperlipidemia - Last lipid panel on 11/25/14 with LDL 45. -Obtain lipid panel  -Continue zocor 20 mg daily   Chronic combined CHF - Pt currently euvolemic. Last 2D-echo on 06/08/13 with EF 30-35% and grade 1 diastolic dysfunction.  -Monitor daily weights and strict I & O's -Obtain 2D-echo  -Hold home lisinopril 20 mg daily and lopressor 50 mg daily to allow for permissive hypertension   Seizure disorder - No recent seizure activity.  -IV keppra 1000 mg daily (PO once passes swallow evaluation)  Non-tophaceous gout - No recent flare.   -Continue home allopurinol 100 mg daily and colchicine 0.6 mg daily   HIV screening - Last tested on 06/12/13.   -Follow-up HIV Ab   Diet: NPO  until passes swallow evaluation  DVT Ppx: SQ heparin  Code: Full   Dispo: Disposition is deferred at this time, awaiting improvement of current medical problems. Anticipated discharge in approximately 1-4 day(s).   The patient does not have a current PCP (No primary care provider on file.) and does need an [facility] hospital follow-up appointment after discharge.  The patient does have transportation limitations that hinder transportation to clinic appointments.  Signed: Juluis Mire, MD 07/11/2015, 4:24 PM

## 2015-07-11 NOTE — ED Notes (Signed)
Neurology at bedside.

## 2015-07-11 NOTE — ED Notes (Signed)
Spoke with EDP ordered STAT CT of the head. Called CT transport patient on monitor by nurse.

## 2015-07-11 NOTE — ED Notes (Signed)
Pt to x-ray at this time.  Manus Gunning, RN on [facility] updated on pt.

## 2015-07-11 NOTE — ED Notes (Signed)
Onset today 0200 left eye visions changes. Went to dialysis and had a full treatment. After dialysis the center sent patient to ED for evaluation.  EMS reported left eye non reactive able to see out of left eye. History of stroke with speech changes.  Alert answering and following commands appropriate.

## 2015-07-11 NOTE — H&P (Signed)
Date: 07/11/2015               Patient Name:  Ernest [name] MRN: [id number]  DOB: Feb 15, 1946 Age / Sex: 69 y.o., male   PCP: No primary care provider on file.              Medical Service: Internal Medicine Teaching Service              Attending Physician: Dr. Axel Filler, MD    First Contact: Florentina Addison, MS 4 Pager: [phone]  Second Contact: Dr. Naaman Plummer Pager: [phone]  Third Contact Dr.  Pager: [phone]       After Hours (After 5p/  First Contact Pager: [phone]  weekends / holidays): Second Contact Pager: [phone]   Chief Complaint:  "I woke up seeing double"  History of Present Illness: Ernest [name] is a 69 y.o. male with at pertinent PMH significant for stroke, seizures, HTN, DM2 with retinopathy, ESRD on dialysis, and CAD s/p stent who presented to [facility] ED for evaluation of diplopia. Patient states that his last known normal was around 11-12 PM. He   awoke at 2 AM with double vision and difficulty walking in a straight line. He states that he then went back to sleep. Upon awaking, symptoms were still present. At dialysis, staff noticed ataxia and EMS was called at 11PM, bringing patient to ED. In addition, patient endorsed bilateral tingling in his hands that has been present for years. He denies facial droop, weakness, dysphagia, dysarthria above baseline, headache, nausea, vomiting, dizziness, lightheadednesschest pain, and SOB.  Patient has a 6 year history of smoking 2 cigars/day but quit in 1974. He does not drink or use illicit substances.   Of note, patient is on Plavix and states that he is compliant with all of his medications except ASA. He stopped taking ASA a few weeks ago when he ran out.   In the ED, patient was found to be hypertensive and to have a non-reactive pinpoint pupil on right and CNII palsy on right. CT scan showed no active bleed. Neurology was consulted and recommended admission and MRI w/o contrast. MRI was unable to be performed 2/2 to  a penile implant of unknown material.    Meds: Current Facility-Administered Medications  Medication Dose Route Frequency Provider Last Rate Last Dose  . allopurinol (ZYLOPRIM) tablet 100 mg  100 mg Oral Daily Marjan Rabbani, MD      . amitriptyline (ELAVIL) tablet 25 mg  25 mg Oral QHS Marjan Rabbani, MD      . aspirin EC tablet 81 mg  81 mg Oral Daily Marjan Rabbani, MD      . aspirin suppository 300 mg  300 mg Rectal Once Juluis Mire, MD      . Derrill Memo ON 07/12/2015] Calcium Acetate TABS 2,001 tablet  2,001 tablet Oral TID WC Marjan Rabbani, MD      . cinacalcet (SENSIPAR) tablet 60 mg  60 mg Oral BID Marjan Rabbani, MD      . clopidogrel (PLAVIX) tablet 75 mg  75 mg Oral Daily Marjan Rabbani, MD      . colchicine tablet 0.6 mg  0.6 mg Oral Daily Marjan Rabbani, MD      . heparin injection 5,000 Units  5,000 Units Subcutaneous 3 times per day Juluis Mire, MD   5,000 Units at 07/11/15 1847  . [START ON 07/12/2015] insulin aspart (novoLOG) injection 0-9 Units  0-9 Units Subcutaneous TID WC Juluis Mire, MD      .  insulin NPH Human (HUMULIN N,NOVOLIN N) injection 8 Units  8 Units Subcutaneous BID Marjan Rabbani, MD      . levETIRAcetam (KEPPRA) 1,000 mg in sodium chloride 0.9 % 100 mL IVPB  1,000 mg Intravenous Q24H Marjan Rabbani, MD      . multivitamin (RENA-VIT) tablet 1 tablet  1 tablet Oral Daily Marjan Rabbani, MD      . senna-docusate (Senokot-S) tablet 1 tablet  1 tablet Oral QHS PRN Marjan Rabbani, MD      . sevelamer carbonate (RENVELA) tablet 800-3,200 mg  800-3,200 mg Oral 5 X Daily Marjan Rabbani, MD      . Derrill Memo ON 07/12/2015] simvastatin (ZOCOR) tablet 20 mg  20 mg Oral q1800 Juluis Mire, MD        Allergies: Allergies as of 07/11/2015  . ([name])   Past Medical History  Diagnosis Date  . Hyperlipidemia   . Retinopathy   . Gout   . ESRD (end stage renal disease)     Started HD in [place] in 2009, ESRD was due to DM. Moved to [facility] in Dec 2009 and  now gets dialysis at [facility] on a MWF schedule.     . Type II or unspecified type diabetes mellitus without mention of complication, not stated as uncontrolled     adult onset  . Erectile dysfunction     penile implant  . Abnormal stress test     s/p cath November 2013 with modest disease involving the ostial left main, proximal LAD, proximal RCA - do not appear to be hemodynamically signficant; mild LV dysfunction  . Obesity   . Hypertension   . Hepatitis C   . COPD (chronic obstructive pulmonary disease)   . Coronary artery disease     per cath report 2013  . Ejection fraction < 50%   . Chronic systolic CHF (congestive heart failure)   . Bacteremia   . Shortness of breath     Hx: of with exertion  . Gout     Hx: of  . Stroke    Past Surgical History  Procedure Laterality Date  . Penile prosthesis implant      1997.. no card  . Fistula      RUE and wrist  . Cardiac catheterization  August 26, 2012  . Eye surgery      laser.  and surgery for DM  . Insertion of dialysis catheter Right 01/01/2013    Procedure: INSERTION OF DIALYSIS CATHETER right  internal jugular;  Surgeon: Serafina Mitchell, MD;  Location: [place];  Service: Vascular;  Laterality: Right;  . Av fistula placement Left 01/13/2013    Procedure: INSERTION OF ARTERIOVENOUS (AV) GORE-TEX GRAFT ARM;  Surgeon: Angelia Mould, MD;  Location: [place];  Service: Vascular;  Laterality: Left;  . Thrombectomy w/ embolectomy Left 03/26/2013    Procedure: Attempted thrombectomy of left arm arteriovenous goretex graft.;  Surgeon: Angelia Mould, MD;  Location: [place];  Service: Vascular;  Laterality: Left;  . Insertion of dialysis catheter N/A 03/26/2013    Procedure: INSERTION OF DIALYSIS CATHETER Left internal jugular vein;  Surgeon: Angelia Mould, MD;  Location: [place];  Service: Vascular;  Laterality: N/A;  . Avgg removal Left 03/26/2013    Procedure: REMOVAL OF  NON INCORPORATED ARTERIOVENOUS GORETEX GRAFT  ([place]) left arm * repair of  left brachial artery with vein patch angioplasty.;  Surgeon: Angelia Mould, MD;  Location: [place];  Service: Vascular;  Laterality: Left;  . Av fistula placement Left 05/05/2013    Procedure: INSERTION OF LEFT UPPER ARM  ARTERIOVENOUS GORTEX GRAFT;  Surgeon: Angelia Mould, MD;  Location: [place];  Service: Vascular;  Laterality: Left;  . Tee without cardioversion N/A 06/11/2013    Procedure: TRANSESOPHAGEAL ECHOCARDIOGRAM (TEE);  Surgeon: Larey Dresser, MD;  Location: [place];  Service: Cardiovascular;  Laterality: N/A;  . Embolectomy Right 08/27/2013    Procedure: EMBOLECTOMY;  Surgeon: Mal Misty, MD;  Location: [place];  Service: Vascular;  Laterality: Right;  Thrombectomy of Radial and ulnar artery.  . Cataract extraction w/ intraocular lens  implant, bilateral    . Colonoscopy    . Ligation of arteriovenous  fistula Right 12/30/2013    Procedure: REMOVAL OF SEGMENT OF GORTEX GRAFT AND FISTULA  AND REPAIR OF BRACHIAL ARTERY;  Surgeon: Mal Misty, MD;  Location: [place];  Service: Vascular;  Laterality: Right;  Annell Greening N/A 04/07/2013    Procedure: VENOGRAM;  Surgeon: Serafina Mitchell, MD;  Location: [facility] CATH LAB;  Service: Cardiovascular;  Laterality: N/A;   Family History  Problem Relation Age of Onset  . Heart disease Father   . Diabetes Sister   . Diabetes Brother   . Diabetes Son    Social History   Social History  . Marital Status: Married    Spouse Name: N/A  . Number of Children: N/A  . Years of Education: N/A   Occupational History  . 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 Smoking status: Former Smoker -- 7 years    Quit date: 06/10/1973  . Smokeless tobacco: Never Used  . Alcohol Use: No  . Drug Use: No  . Sexual Activity: No   Other Topics Concern  . Not on file   Social History Narrative   Adopted mentally retarded child at home, 2 adopted children, 3 living and 2 deceased.    Review  of Systems: Negative unless otherwise stated in HPI  Physical Exam: Blood pressure 187/54, pulse 55, temperature 97.7 F (36.5 C), temperature source Oral, resp. rate 18, height 6' (1.829 m), weight 102.059 kg (225 lb), SpO2 98 %.  Physical Exam  Constitutional:  Elderly male laying in bed in NAD   HENT:  Head: Normocephalic and atraumatic.  Eyes: Conjunctivae are normal. Left eye exhibits discharge (Clear). No scleral icterus.  Left pupil pinpoint and unreactive.  No adduction or abduction with left eye.   Neck: Normal range of motion. No JVD present.  Cardiovascular: Normal rate and regular rhythm.  Exam reveals no gallop and no friction rub.   No murmur heard. Pulmonary/Chest: Effort normal. No stridor. No respiratory distress. He has no wheezes. He has no rales.  Abdominal: Bowel sounds are normal. He exhibits no distension. There is no tenderness. There is no rebound and no guarding.  Musculoskeletal: Normal range of motion.  Lymphadenopathy:    He has no cervical adenopathy.  Neurological: He is alert. A cranial nerve deficit (Unable to adduct or abduct left eye. Left pupil pinpoint and unreactive) is present. Coordination (Pass pointing both hands) abnormal.  Skin: Skin is warm and dry.  Psychiatric: He has a normal mood and affect.     Lab results: Results for orders placed or performed during the hospital encounter of 07/11/15 (from the past 24 hour(s))  Protime-INR     Status: None   Collection Time: 07/11/15  1:57 PM  Result Value Ref  Range   Prothrombin Time 13.2 11.6 - 15.2 seconds   INR 0.98 0.00 - 1.49  APTT     Status: Abnormal   Collection Time: 07/11/15  1:57 PM  Result Value Ref Range   aPTT 20 (L) 24 - 37 seconds  CBC     Status: Abnormal   Collection Time: 07/11/15  1:57 PM  Result Value Ref Range   WBC 6.6 4.0 - 10.5 K/uL   RBC 4.53 4.22 - 5.81 MIL/uL   Hemoglobin 13.1 13.0 - 17.0 g/dL   HCT 38.5 (L) 39.0 - 52.0 %   MCV 85.0 78.0 - 100.0 fL   MCH  28.9 26.0 - 34.0 pg   MCHC 34.0 30.0 - 36.0 g/dL   RDW 14.9 11.5 - 15.5 %   Platelets  150 - 400 K/uL    PLATELET CLUMPS NOTED ON SMEAR, COUNT APPEARS ADEQUATE  Differential     Status: None   Collection Time: 07/11/15  1:57 PM  Result Value Ref Range   Neutrophils Relative % 64 %   Lymphocytes Relative 22 %   Monocytes Relative 10 %   Eosinophils Relative 3 %   Basophils Relative 1 %   Neutro Abs 4.1 1.7 - 7.7 K/uL   Lymphs Abs 1.5 0.7 - 4.0 K/uL   Monocytes Absolute 0.7 0.1 - 1.0 K/uL   Eosinophils Absolute 0.2 0.0 - 0.7 K/uL   Basophils Absolute 0.1 0.0 - 0.1 K/uL   Smear Review FIBRIN STRANDS NOTED   Ethanol     Status: None   Collection Time: 07/11/15  2:00 PM  Result Value Ref Range   Alcohol, Ethyl (B) <5 <5 mg/dL  I-stat troponin, ED (not at [facility])     Status: None   Collection Time: 07/11/15  2:00 PM  Result Value Ref Range   Troponin i, poc 0.02 0.00 - 0.08 ng/mL   Comment 3          Comprehensive metabolic panel     Status: Abnormal   Collection Time: 07/11/15  2:00 PM  Result Value Ref Range   Sodium 142 135 - 145 mmol/L   Potassium 3.2 (L) 3.5 - 5.1 mmol/L   Chloride 95 (L) 101 - 111 mmol/L   CO2 37 (H) 22 - 32 mmol/L   Glucose, Bld 100 (H) 65 - 99 mg/dL   BUN 14 6 - 20 mg/dL   Creatinine, Ser 5.63 (H) 0.61 - 1.24 mg/dL   Calcium 8.6 (L) 8.9 - 10.3 mg/dL   Total Protein 6.5 6.5 - 8.1 g/dL   Albumin 3.4 (L) 3.5 - 5.0 g/dL   AST 19 15 - 41 U/L   ALT 16 (L) 17 - 63 U/L   Alkaline Phosphatase 54 38 - 126 U/L   Total Bilirubin 0.4 0.3 - 1.2 mg/dL   GFR calc non Af Amer 9 (L) >60 mL/min   GFR calc Af Amer 11 (L) >60 mL/min   Anion gap 10 5 - 15  I-Stat Chem 8, ED  (not at [facility])     Status: Abnormal   Collection Time: 07/11/15  2:02 PM  Result Value Ref Range   Sodium 140 135 - 145 mmol/L   Potassium 3.9 3.5 - 5.1 mmol/L   Chloride 93 (L) 101 - 111 mmol/L   BUN 22 (H) 6 - 20 mg/dL   Creatinine, Ser 5.10 (H) 0.61 - 1.24 mg/dL   Glucose, Bld 101  (H) 65 - 99 mg/dL   Calcium,  Ion 0.96 (L) 1.13 - 1.30 mmol/L   TCO2 37 0 - 100 mmol/L   Hemoglobin 13.6 13.0 - 17.0 g/dL   HCT 40.0 39.0 - 52.0 %  Glucose, capillary     Status: None   Collection Time: 07/11/15  6:24 PM  Result Value Ref Range   Glucose-Capillary 72 65 - 99 mg/dL   Comment 1 Notify RN    Comment 2 Document in Chart      Imaging results:  Dg Chest 2 View  07/11/2015   CLINICAL DATA:  Left eye vision changes today. Hemodialysis patient. Initial encounter.  EXAM: CHEST  2 VIEW  COMPARISON:  04/29/2015 and 12/05/2014.  FINDINGS: The heart size and mediastinal contours are stable. Right axillary vascular stent noted. The overall basilar aeration has improved. There is no confluent airspace opacity, edema or significant pleural effusion. The bones appear unchanged.  IMPRESSION: No acute cardiopulmonary process.   Electronically Signed   By: Richardean Sale M.D.   On: 07/11/2015 16:57   Ct Head Wo Contrast  07/11/2015   CLINICAL DATA:  Intracranial hemorrhage. LEFT pupil nonreactive. Stroke with speech changes.  EXAM: CT HEAD WITHOUT CONTRAST  TECHNIQUE: Contiguous axial images were obtained from the base of the skull through the vertex without intravenous contrast.  COMPARISON:  04/25/2015.  FINDINGS: No mass lesion, mass effect, midline shift, hydrocephalus, hemorrhage. No acute territorial cortical ischemia/infarct. Atrophy and chronic ischemic white matter disease is present. Benign basal ganglia calcifications along with scattered parenchymal calcifications. These are chronic findings and appears similar to the prior exam. Old lacunar infarct in the RIGHT cerebellar hemisphere. Visible paranasal sinuses appear normal. Dense intracranial atherosclerosis.  IMPRESSION: Atrophy, chronic ischemic white matter disease and old lacunar infarcts without acute intracranial abnormality. No interval change from 04/25/2015.   Electronically Signed   By: Dereck Ligas M.D.   On: 07/11/2015 13:49     Other results: EKG: Sinus rhythm Atrial premature complexes Abnormal  R-wave progression, late transition LVH with secondary repolarization  abnormality Borderline prolonged QT interval No significant change since previous EKG   Assessment & Plan by Problem: Active Problems:   Stroke   Diplopia   Ernest [name] is a 69 y.o. male with a hx of stroke, seizure, CAD, HTN, and ESRD who presents with symptoms concerning for CVA.  ##Possible [place] CVA- Patient outside of TPA window. Non-contrast CT scan show no acute process. MRI not done due to unknown implant status -Fax OSH for implant records, either [facility] or [place] -ASA suppository , 300 mg daily,  -Echocardiogram pending -MRI/MRA pending implant status -Clopidogrel 75mg    ##Dysphagia: ED states patient complained of dysphagia -HOLD ALL PO MEDS PENDING SWALLOW EVAL -NPO pending eval   ##Chronic CHF -Hold Antihypertensives to allow permissive HTN to 220/120  ##Diabetes Mellitus, Type II -Insulin NPH Human 8 unit IV BID -SSI  -HgA1C pending    #CAD: Uptrending troponins. -ASA 81 mg PO daily  -Start Heparin due to elevated troponin -Continue trending troponins  -Clopidogrel 75 mg daily   ##HTN -Hold home meds. -Permissive HTN up to 220/120  #HLD -simvastatin 20 mg PO daily   ##ESRD:  -Dialysis M/W/F -Cinacalcet  60 mg PO for secondary hyperparathyroism -Calcium Acetate 2,001 PO 3x daily -Sevelamer carbonate 80 -RENA-VIT- 1 tablet  -Renal Function Panel Pending  -Contact Dialysis tomorrow  ##Gout -Allopurinol 100mg  daily -Cochicine 0.6 mg PO daily  #Seizures -IV Keppra 1000 mg daily   #Infectous Disease- History of  Hep C -Hep C antibody negative  -HIV Antibody  #GI -Ducosate 1 tablet PO at bedtime  This is a Careers information officer Note.  The care of the patient was discussed with Dr. Naaman Plummer and the assessment and plan was formulated with their assistance.  Please see  their note for official documentation of the patient encounter.   Signed: Florentina Addison, Med Student 07/11/2015, 7:00 PM

## 2015-07-11 NOTE — ED Notes (Signed)
States have trouble swallowing when he eats intermittently.

## 2015-07-11 NOTE — H&P (Signed)
Internal Medicine Attending Admission Note  I saw and evaluated the patient. I reviewed the resident's note and I agree with the resident's findings and plan as documented in the resident's note.  Assessment & Plan by Problem:  Active Problems:   Stroke   Diplopia due to probable CVA: Patient has multiple risk factors for recurrent strokes including poorly controlled diabetes, hypertension, hyperlipidemia, and known atherosclerosis. New-onset of diplopia which is not improving combined with cranial nerve deficits seen in the left eye make CVA a likely diagnosis. It seems he has been on combination aspirin and Plavix since around 2014 due to moderate multivessel coronary artery disease. - MRI brain tonight, no contrast due to ESRD - Carotid ultrasound - Permissive hypertension tonight - Neurology consulted - Continue aspirin and clopidogrel for now - Simvastatin 20mg  daily - PT and OT evaluations   Chief Complaint(s):  Double vision  History - key components related to admission:  Patient is a 69 year old man with multiple prior CVA and end-stage renal disease on hemodialysis who was sent to the emergency department today from dialysis because of persistent double vision. Patient reports awakening around 2 AM this morning with symptoms of double vision which is new for him. Denies headache. He was able to walk but reports worsening balance and unsteadiness on his feet, which is also a new finding. Denies vertigo or lightheadedness. Denies weakness in his upper or lower extremities. Denies paresthesia. Reports good compliance with his home medications, including daily aspirin and clopidogrel. No recent fevers or chills. No chest pain, cough, shortness of breath, or dyspnea on exertion. He admitted to dialysis today, completed a full course which took about 4 hours. He told the physicians there about his double vision, and it did not resolve so he was sent to the emergency department via EMS. He  reports having strokes in the past and says that he still has residual difficulty with his speech. Otherwise he lives at home with his wife and daughter, says he is independent in his activities of daily living.  Lab results: Reviewed in Epic  Physical Exam - key components related to admission:  Filed Vitals:   07/11/15 1430 07/11/15 1445 07/11/15 1450 07/11/15 1500  BP: 199/55 205/51 198/58 203/62  Pulse: 61 56 51 54  Temp:      TempSrc:      Resp: 16 18 16 18   Height:      Weight:      SpO2: 99% 96% 99% 100%    Gen: Chronically ill appearing man, no distress ENT: Dry MM, no lesions Neck: Thick, no JVD, no LAD CV: RRR, no murmurs Lungs: Unlabored, clear throughout Abd: Soft, non-tender, non-distended Ext: Warm, well perfused, chronic stasis changes Skin: No rashes Neuro: Alert and oriented spontaneously, speaks slowly but conversational, left ocular muscle deficit, left eye does not adduct across midline, right eye normal EOM, normal strength in upper and lower extremities, normal sensation throughout

## 2015-07-12 ENCOUNTER — Encounter (HOSPITAL_COMMUNITY): Payer: Self-pay | Admitting: General Practice

## 2015-07-12 ENCOUNTER — Ambulatory Visit (HOSPITAL_BASED_OUTPATIENT_CLINIC_OR_DEPARTMENT_OTHER): Payer: Medicare Other

## 2015-07-12 DIAGNOSIS — I6789 Other cerebrovascular disease: Secondary | ICD-10-CM | POA: Diagnosis not present

## 2015-07-12 DIAGNOSIS — E1159 Type 2 diabetes mellitus with other circulatory complications: Secondary | ICD-10-CM | POA: Diagnosis not present

## 2015-07-12 DIAGNOSIS — N186 End stage renal disease: Secondary | ICD-10-CM

## 2015-07-12 DIAGNOSIS — I6302 Cerebral infarction due to thrombosis of basilar artery: Secondary | ICD-10-CM

## 2015-07-12 DIAGNOSIS — H532 Diplopia: Secondary | ICD-10-CM | POA: Diagnosis not present

## 2015-07-12 DIAGNOSIS — I639 Cerebral infarction, unspecified: Secondary | ICD-10-CM

## 2015-07-12 DIAGNOSIS — E1129 Type 2 diabetes mellitus with other diabetic kidney complication: Secondary | ICD-10-CM | POA: Diagnosis not present

## 2015-07-12 DIAGNOSIS — Z794 Long term (current) use of insulin: Secondary | ICD-10-CM | POA: Diagnosis not present

## 2015-07-12 DIAGNOSIS — E785 Hyperlipidemia, unspecified: Secondary | ICD-10-CM

## 2015-07-12 DIAGNOSIS — I10 Essential (primary) hypertension: Secondary | ICD-10-CM

## 2015-07-12 DIAGNOSIS — I120 Hypertensive chronic kidney disease with stage 5 chronic kidney disease or end stage renal disease: Secondary | ICD-10-CM | POA: Diagnosis not present

## 2015-07-12 LAB — CBC
HCT: 36.3 % — ABNORMAL LOW (ref 39.0–52.0)
Hemoglobin: 12.1 g/dL — ABNORMAL LOW (ref 13.0–17.0)
MCH: 28.7 pg (ref 26.0–34.0)
MCHC: 33.3 g/dL (ref 30.0–36.0)
MCV: 86.2 fL (ref 78.0–100.0)
PLATELETS: 156 10*3/uL (ref 150–400)
RBC: 4.21 MIL/uL — AB (ref 4.22–5.81)
RDW: 14.8 % (ref 11.5–15.5)
WBC: 6.5 10*3/uL (ref 4.0–10.5)

## 2015-07-12 LAB — RENAL FUNCTION PANEL
Albumin: 3.2 g/dL — ABNORMAL LOW (ref 3.5–5.0)
Anion gap: 11 (ref 5–15)
BUN: 18 mg/dL (ref 6–20)
CALCIUM: 8.4 mg/dL — AB (ref 8.9–10.3)
CO2: 33 mmol/L — ABNORMAL HIGH (ref 22–32)
CREATININE: 7.49 mg/dL — AB (ref 0.61–1.24)
Chloride: 95 mmol/L — ABNORMAL LOW (ref 101–111)
GFR, EST AFRICAN AMERICAN: 8 mL/min — AB (ref >60)
GFR, EST NON AFRICAN AMERICAN: 7 mL/min — AB (ref >60)
Glucose, Bld: 115 mg/dL — ABNORMAL HIGH (ref 65–99)
PHOSPHORUS: 4.3 mg/dL (ref 2.5–4.6)
Potassium: 3.2 mmol/L — ABNORMAL LOW (ref 3.5–5.1)
SODIUM: 139 mmol/L (ref 135–145)

## 2015-07-12 LAB — TSH: TSH: 1.064 u[IU]/mL (ref 0.350–4.500)

## 2015-07-12 LAB — LIPID PANEL
CHOL/HDL RATIO: 2.2 ratio
CHOLESTEROL: 96 mg/dL (ref 0–200)
HDL: 44 mg/dL (ref >40)
LDL Cholesterol: 36 mg/dL (ref 0–99)
Triglycerides: 79 mg/dL (ref <150)
VLDL: 16 mg/dL (ref 0–40)

## 2015-07-12 LAB — GLUCOSE, CAPILLARY
GLUCOSE-CAPILLARY: 132 mg/dL — AB (ref 65–99)
GLUCOSE-CAPILLARY: 147 mg/dL — AB (ref 65–99)
GLUCOSE-CAPILLARY: 163 mg/dL — AB (ref 65–99)
GLUCOSE-CAPILLARY: 192 mg/dL — AB (ref 65–99)
GLUCOSE-CAPILLARY: 52 mg/dL — AB (ref 65–99)
GLUCOSE-CAPILLARY: 56 mg/dL — AB (ref 65–99)
GLUCOSE-CAPILLARY: 73 mg/dL (ref 65–99)
Glucose-Capillary: 108 mg/dL — ABNORMAL HIGH (ref 65–99)
Glucose-Capillary: 112 mg/dL — ABNORMAL HIGH (ref 65–99)

## 2015-07-12 LAB — HIV ANTIBODY (ROUTINE TESTING W REFLEX): HIV SCREEN 4TH GENERATION: NONREACTIVE

## 2015-07-12 MED ORDER — NEPRO/CARBSTEADY PO LIQD
237.0000 mL | Freq: Two times a day (BID) | ORAL | Status: DC
  Administered 2015-07-12: 237 mL via ORAL

## 2015-07-12 MED ORDER — CETYLPYRIDINIUM CHLORIDE 0.05 % MT LIQD
7.0000 mL | Freq: Two times a day (BID) | OROMUCOSAL | Status: DC
  Administered 2015-07-13 (×2): 7 mL via OROMUCOSAL

## 2015-07-12 MED ORDER — SEVELAMER CARBONATE 800 MG PO TABS
800.0000 mg | ORAL_TABLET | Freq: Every day | ORAL | Status: DC
  Administered 2015-07-13: 800 mg via ORAL
  Filled 2015-07-12: qty 1

## 2015-07-12 MED ORDER — DEXTROSE 50 % IV SOLN
25.0000 mL | Freq: Once | INTRAVENOUS | Status: AC
  Administered 2015-07-12: 25 mL via INTRAVENOUS

## 2015-07-12 MED ORDER — LEVETIRACETAM 500 MG PO TABS
1000.0000 mg | ORAL_TABLET | Freq: Every day | ORAL | Status: DC
  Administered 2015-07-12 – 2015-07-13 (×2): 1000 mg via ORAL
  Filled 2015-07-12 (×2): qty 2

## 2015-07-12 MED ORDER — DEXTROSE 50 % IV SOLN
INTRAVENOUS | Status: AC
  Filled 2015-07-12: qty 50

## 2015-07-12 MED ORDER — LEVETIRACETAM 500 MG PO TABS: 500.0000 mg | ORAL_TABLET | ORAL | Status: DC

## 2015-07-12 MED ORDER — CHLORHEXIDINE GLUCONATE 0.12 % MT SOLN
15.0000 mL | Freq: Two times a day (BID) | OROMUCOSAL | Status: DC
  Administered 2015-07-12: 15 mL via OROMUCOSAL
  Filled 2015-07-12: qty 15

## 2015-07-12 MED ORDER — DEXTROSE 50 % IV SOLN
1.0000 | Freq: Once | INTRAVENOUS | Status: AC
  Administered 2015-07-12: 50 mL via INTRAVENOUS

## 2015-07-12 MED ORDER — INFLUENZA VAC SPLIT QUAD 0.5 ML IM SUSY
0.5000 mL | PREFILLED_SYRINGE | INTRAMUSCULAR | Status: AC
  Administered 2015-07-13: 0.5 mL via INTRAMUSCULAR
  Filled 2015-07-12: qty 0.5

## 2015-07-12 NOTE — Progress Notes (Signed)
  Echocardiogram 2D Echocardiogram has been performed.  Ernest [name], Ernest [name] 07/12/2015, 1:08 PM

## 2015-07-12 NOTE — Progress Notes (Signed)
Hypoglycemic Event  CBG: 56  Treatment: D25% dextrose injection  Symptoms: None  Follow-up CBG: Time:0030 CBG Result:112  Possible Reasons for Event: Inadequate meal intake  Comments/MD notified:Yes    Ernest [name], April E  Remember to initiate Hypoglycemia Order Set & complete

## 2015-07-12 NOTE — Progress Notes (Signed)
VASCULAR LAB PRELIMINARY  PRELIMINARY  PRELIMINARY  PRELIMINARY  Carotid duplex  completed.    Preliminary report:  Bilateral:  1-39% ICA stenosis.  Vertebral artery flow is antegrade.      Cestone,Helene, RVT 07/12/2015, 12:02 PM

## 2015-07-12 NOTE — Evaluation (Signed)
Clinical/Bedside Swallow Evaluation Patient Details  Name: Ernest [name] MRN: [id number] Date of Birth: 1946-07-15  Today's Date: 07/12/2015 Time: SLP Start Time (ACUTE ONLY): 0750 SLP Stop Time (ACUTE ONLY): 0819 SLP Time Calculation (min) (ACUTE ONLY): 29 min  Past Medical History:  Past Medical History  Diagnosis Date  . Hyperlipidemia   . Retinopathy   . Gout   . ESRD (end stage renal disease)     Started HD in [place] in 2009, ESRD was due to DM. Moved to [facility] in Dec 2009 and now gets dialysis at [facility] on a MWF schedule.     . Type II or unspecified type diabetes mellitus without mention of complication, not stated as uncontrolled     adult onset  . Erectile dysfunction     penile implant  . Abnormal stress test     s/p cath November 2013 with modest disease involving the ostial left main, proximal LAD, proximal RCA - do not appear to be hemodynamically signficant; mild LV dysfunction  . Obesity   . Hypertension   . Hepatitis C   . COPD (chronic obstructive pulmonary disease)   . Coronary artery disease     per cath report 2013  . Ejection fraction < 50%   . Chronic systolic CHF (congestive heart failure)   . Bacteremia   . Shortness of breath     Hx: of with exertion  . Gout     Hx: of  . Stroke    Past Surgical History:  Past Surgical History  Procedure Laterality Date  . Penile prosthesis implant      1997.. no card  . Fistula      RUE and wrist  . Cardiac catheterization  August 26, 2012  . Eye surgery      laser.  and surgery for DM  . Insertion of dialysis catheter Right 01/01/2013    Procedure: INSERTION OF DIALYSIS CATHETER right  internal jugular;  Surgeon: Serafina Mitchell, MD;  Location: [place];  Service: Vascular;  Laterality: Right;  . Av fistula placement Left 01/13/2013    Procedure: INSERTION OF ARTERIOVENOUS (AV) GORE-TEX GRAFT ARM;  Surgeon: Angelia Mould, MD;  Location: [place];  Service: Vascular;  Laterality: Left;  . Thrombectomy  w/ embolectomy Left 03/26/2013    Procedure: Attempted thrombectomy of left arm arteriovenous goretex graft.;  Surgeon: Angelia Mould, MD;  Location: [place];  Service: Vascular;  Laterality: Left;  . Insertion of dialysis catheter N/A 03/26/2013    Procedure: INSERTION OF DIALYSIS CATHETER Left internal jugular vein;  Surgeon: Angelia Mould, MD;  Location: [place];  Service: Vascular;  Laterality: N/A;  . Avgg removal Left 03/26/2013    Procedure: REMOVAL OF  NON INCORPORATED ARTERIOVENOUS GORETEX GRAFT ([place]) left arm * repair of  left brachial artery with vein patch angioplasty.;  Surgeon: Angelia Mould, MD;  Location: [place];  Service: Vascular;  Laterality: Left;  . Av fistula placement Left 05/05/2013    Procedure: INSERTION OF LEFT UPPER ARM  ARTERIOVENOUS GORTEX GRAFT;  Surgeon: Angelia Mould, MD;  Location: [place];  Service: Vascular;  Laterality: Left;  . Tee without cardioversion N/A 06/11/2013    Procedure: TRANSESOPHAGEAL ECHOCARDIOGRAM (TEE);  Surgeon: Larey Dresser, MD;  Location: [place];  Service: Cardiovascular;  Laterality: N/A;  . Embolectomy Right 08/27/2013    Procedure: EMBOLECTOMY;  Surgeon: Mal Misty, MD;  Location: [place];  Service: Vascular;  Laterality: Right;  Thrombectomy  of Radial and ulnar artery.  . Cataract extraction w/ intraocular lens  implant, bilateral    . Colonoscopy    . Ligation of arteriovenous  fistula Right 12/30/2013    Procedure: REMOVAL OF SEGMENT OF GORTEX GRAFT AND FISTULA  AND REPAIR OF BRACHIAL ARTERY;  Surgeon: Mal Misty, MD;  Location: [place];  Service: Vascular;  Laterality: Right;  Annell Greening N/A 04/07/2013    Procedure: VENOGRAM;  Surgeon: Serafina Mitchell, MD;  Location: [facility] CATH LAB;  Service: Cardiovascular;  Laterality: N/A;   HPI:  69 yo male with h/o cvas x3 adm with diplopia.  PMH + for HTN, CVAs, DM, smoker -quit 1974. .  MRI planned as CT negative.  CXR negative.     Assessment / Plan /  Recommendation Clinical Impression  Pt presents with functional oropharyngeal swallow with no indications of dysphagia or airway compromise.  HIs laryngeal elevation appeared adequate with swift swallow.   Pt denies dyspnea with intake although he has COPD.   He eats regular consistency foods prior to admission and does not use his dentures.  Recommend regular/thin.      Aspiration Risk  Mild    Diet Recommendation Age appropriate regular solids;Thin   Medication Administration: Whole meds with liquid Compensations: Slow rate;Small sips/bites    Other  Recommendations Oral Care Recommendations: Oral care BID   Follow Up Recommendations  None    Frequency and Duration        Pertinent Vitals/Pain Afebrile, decreased      Swallow Study Prior Functional Status  Cognitive/Linguistic Baseline: Baseline deficits Baseline deficit details: speech and memory deficits from prior cvas Type of Home: House  Lives With: Spouse;Daughter (dtr 28 = pt is a little slow per pt) Available Help at Discharge: Family (wife is wheelchair bound- can drive) Education: 6th grade, pastor for 16 years, 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  PMH + for HTN, CVAs, DM, smoker -quit 1974. .  MRI planned as CT negative.  CXR negative.   Type of Study: Bedside swallow evaluation Diet Prior to this Study: NPO Temperature Spikes Noted: No Respiratory Status: Supplemental O2 delivered via (comment) History of Recent Intubation: No Behavior/Cognition: Alert;Cooperative;Pleasant mood Oral Cavity - Dentition: Poor condition (pt reports he has a denture at home that he does not use) Self-Feeding Abilities: Able to feed self Patient Positioning: Upright in bed Baseline Vocal Quality: Normal Volitional Cough: Strong Volitional Swallow: Able to elicit    Oral/Motor/Sensory  Function Overall Oral Motor/Sensory Function: Other (comment) (left eye brow decreased movement, decreased lingual strength/coordination) Lingual Sensation: Reduced Facial Symmetry:  (decreased movement of left eyebrow)   Ice Chips Ice chips: Not tested   Thin Liquid Thin Liquid: Within functional limits Presentation: Straw;Cup    Nectar Thick Nectar Thick Liquid: Not tested   Honey Thick Honey Thick Liquid: Not tested   Puree Puree: Within functional limits Presentation: Self Fed;Spoon   Solid   GO Functional Assessment Tool Used: clinical judgement Functional Limitations: Motor speech Motor Speech Current Status [phone]): At least 20 percent but less than 40 percent impaired, limited or restricted Motor Speech Goal Status [phone]): At least 20 percent but less than 40 percent impaired, limited or restricted Motor Speech Goal Status [phone]): At least 20 percent but less than 40 percent impaired, limited or restricted  Solid: Within functional limits Presentation:  [place], [place] [facility] [place]

## 2015-07-12 NOTE — Progress Notes (Addendum)
Rehab Admissions Coordinator Note:  Patient was screened by Cleatrice Burke for appropriateness for an Inpatient Acute Rehab Consult per therapy recommendations.   At this time, we are recommending Inpatient Rehab consult. Noted pt is observation status.  Cleatrice Burke 07/12/2015, 1:20 PM  I can be reached at [phone].

## 2015-07-12 NOTE — Progress Notes (Signed)
   Subjective:  Patient is doing well today, double vision is improved. No headache, new weakness, or paresthesias. Working with speech therapy today and passed his swallow examination. No complaints overnight.  Assessment & Plan by Problem:  Principal Problem:   Stroke Active Problems:   ESRD (end stage renal disease)   Diplopia   Probable CVA: Clinical course of sudden onset diplopia with cranial nerve VI deficit most consistent with small pontine CVA. Cannot obtain MRI because of penile implant. Plan for CT angiogram of the brain today. Neurology consulted. Continue aspirin, plavix, and atorvastatin. A1c pending. Carotid dopplers and echocardiogram pending. PT and OT recommending CIR, so will need PM&R evaluation.   ESRD: On HD MWF, last full HD session yesterday. Currently appears euvolemic. Plan for HD session in house tomorrow and continue regular schedule.    Lab results: Reviewed in Epic  Physical Exam:  Filed Vitals:   07/12/15 0200 07/12/15 0400 07/12/15 0600 07/12/15 0935  BP: 161/65 165/54 153/57 142/57  Pulse: 54 61 56 64  Temp: 98.4 F (36.9 C) 98.4 F (36.9 C) 98.4 F (36.9 C) 99.6 F (37.6 C)  TempSrc: Oral Oral Oral Oral  Resp: 16 16 14 16   Height:      Weight:      SpO2: 93% 97% 97% 97%    Gen: Well appearing today, no distress CV: RRR, no murmurs Lungs: Unlabored, clear throughout Abd: soft, non-tender Neuro: alert and oriented spontaneously, left eye cannot adduct beyond midline, full strength and sensation in the upper and lower extremities.    Axel Filler, MD 07/12/2015, 1:01 PM

## 2015-07-12 NOTE — Evaluation (Signed)
Physical Therapy Evaluation Patient Details Name: Ernest [name] MRN: [id number] DOB: [birth year] Today's Date: 07/12/2015   History of Present Illness  Ernest [name] is a 69 y.o. male with at pertinent PMH significant for stroke, seizures, HTN, DM2 with retinopathy, ESRD on dialysis, and CAD s/p stent who presented to [facility] ED for evaluation of diplopia. CT scan unremarkable.   Clinical Impression  Pt with generalized weakness, visual deficits, impaired spatial awareness, increased falls risk and requires RW for safe ambulation at this time. Pt was indep with AD PTA. Pt to strongly benefit from CIR upon d/c to achieve safe supervision level of function for safe transition at home with spouse.    Follow Up Recommendations CIR    Equipment Recommendations  None recommended by PT (pt has walker at home)    Recommendations for Other Services Rehab consult     Precautions / Restrictions Precautions Precautions: Fall Precaution Comments: vision deficits Restrictions Weight Bearing Restrictions: No      Mobility  Bed Mobility Overal bed mobility: Needs Assistance Bed Mobility: Supine to Sit     Supine to sit: Supervision     General bed mobility comments: increaed time, use of bed rail  Transfers Overall transfer level: Needs assistance Equipment used: Rolling walker (2 wheeled) Transfers: Sit to/from Stand Sit to Stand: Min assist         General transfer comment: pt unsteady requiring minA to maintain balance, v/c's for hand placement, leaned back on bed to steady self  Ambulation/Gait Ambulation/Gait assistance: Min assist;Mod assist Ambulation Distance (Feet): 120 Feet Assistive device: Rolling walker (2 wheeled);None Gait Pattern/deviations: Step-through pattern;Wide base of support Gait velocity: slow   General Gait Details: attempted to ambulate without AD however pt required modA to maintain balance and was reaching for hallway rail. pt with noted  difficulty with spatial awareness, likely due to vision deficits. once given RW pt with increased stabilty however still required minA during turning for safety  Stairs            Wheelchair Mobility    Modified Rankin (Stroke Patients Only)       Balance Overall balance assessment: Needs assistance Sitting-balance support: Feet supported;No upper extremity supported Sitting balance-Leahy Scale: Good     Standing balance support: Bilateral upper extremity supported Standing balance-Leahy Scale: Poor Standing balance comment: requires external support or UE support                             Pertinent Vitals/Pain Pain Assessment: No/denies pain    Home Living Family/patient expects to be discharged to:: Private residence Living Arrangements: Spouse/significant other Available Help at Discharge: Family Type of Home: House Home Access: Stairs to enter   Technical brewer of Steps: 1 Home Layout: Two level Home Equipment: Environmental consultant - 2 wheels;Cane - single point      Prior Function Level of Independence: Independent         Comments: pt was bathing, dressing, ambulating without AD. Wife is w/c bound so can not physically assist pt.     Hand Dominance   Dominant Hand: Right    Extremity/Trunk Assessment   Upper Extremity Assessment: Overall WFL for tasks assessed           Lower Extremity Assessment: Overall WFL for tasks assessed (but noted co-ordination deficits durin gamb)      Cervical / Trunk Assessment: Normal  Communication   Communication:  (difficult to understand)  Cognition Arousal/Alertness: Awake/alert Behavior During Therapy: WFL for tasks assessed/performed Overall Cognitive Status:  (has a 6th grade level of education)                      General Comments General comments (skin integrity, edema, etc.): difficulty to differentiate visual deficits. pt reports he no longer has double vision but then when  asked to read the room numbers he is unable until he is 2 feet away.    Exercises        Assessment/Plan    PT Assessment Patient needs continued PT services  PT Diagnosis Difficulty walking   PT Problem List Decreased strength;Decreased activity tolerance;Decreased balance;Impaired sensation  PT Treatment Interventions DME instruction;Gait training;Stair training;Functional mobility training;Therapeutic activities;Therapeutic exercise;Neuromuscular re-education;Balance training   PT Goals (Current goals can be found in the Care Plan section) Acute Rehab PT Goals Patient Stated Goal: home PT Goal Formulation: With patient Time For Goal Achievement: 07/19/15 Potential to Achieve Goals: Good    Frequency Min 4X/week   Barriers to discharge Decreased caregiver support wife unable to physically assist    Co-evaluation               End of Session Equipment Utilized During Treatment: Gait belt Activity Tolerance: Patient tolerated treatment well Patient left: in chair;with chair alarm set Nurse Communication: Mobility status    Functional Assessment Tool Used: clinical judgement Functional Limitation: Mobility: Walking and moving around Mobility: Walking and Moving Around Current Status [phone]): At least 40 percent but less than 60 percent impaired, limited or restricted Mobility: Walking and Moving Around Goal Status [phone]): At least 1 percent but less than 20 percent impaired, limited or restricted    Time: 0919-0945 PT Time Calculation (min) (ACUTE ONLY): 26 min   Charges:   PT Evaluation $Initial PT Evaluation Tier I: 1 Procedure PT Treatments $Gait Training: 8-22 mins   PT G Codes:   PT G-Codes **NOT FOR INPATIENT CLASS** Functional Assessment Tool Used: clinical judgement Functional Limitation: Mobility: Walking and moving around Mobility: Walking and Moving Around Current Status [id number]): At least 40 percent but less than 60 percent impaired, limited or  restricted Mobility: Walking and Moving Around Goal Status [phone]): At least 1 percent but less than 20 percent impaired, limited or restricted    Kingsley Callander 07/12/2015, 10:31 AM  Kittie Plater, PT, DPT Pager #: [phone] Office #: [phone]

## 2015-07-12 NOTE — Progress Notes (Signed)
STROKE TEAM PROGRESS NOTE  HPI Ernest [name] is an 69 y.o. male male with multiple stroke risk factors. HE states he went to sleep last night normal. He awoke at 0200 and noted he was seeing diplopia and tended to bump in to the wall. He went to dialysis and due to continued diplopia EMS was called and he was brought to hospital after his treatment. Currently he has no complaints of HA but continues to have double vision He also has dysarthria but this is secondary to previous stroke. Initial exam per ED MD showed a bilateral INO like picture but at time of neurology consultation he was unable to move his left eye and only able to laterally deviate his right eye. patient to go for stat CT head.   Date last known well: Date: 07/11/2015 Time last known well: Time: 02:00 tPA Given: No: out of window   SUBJECTIVE (INTERVAL HISTORY) The patient's wife was at the bedside. The patient feels he is improving. Diplopia remains but eye movement seems better. The therapists feel he may need inpatient rehab. He had been on ASA and Plavix but ran out of ASA so was just taking Plavix. Wife said his DM in good control.   OBJECTIVE Temp:  [97.6 F (36.4 C)-99.6 F (37.6 C)] 99.6 F (37.6 C) (09/27 0935) Pulse Rate:  [51-64] 64 (09/27 0935) Cardiac Rhythm:  [-] Normal sinus rhythm (09/27 0749) Resp:  [10-25] 16 (09/27 0935) BP: (142-213)/(51-74) 142/57 mmHg (09/27 0935) SpO2:  [93 %-100 %] 97 % (09/27 0935) FiO2 (%):  [2 %] 2 % (09/27 0935) Weight:  [225 lb (102.059 kg)] 225 lb (102.059 kg) (09/26 1241)  CBC:   Recent Labs Lab 07/11/15 1357 07/11/15 1402 07/12/15 0535  WBC 6.6  --  6.5  NEUTROABS 4.1  --   --   HGB 13.1 13.6 12.1*  HCT 38.5* 40.0 36.3*  MCV 85.0  --  86.2  PLT PLATELET CLUMPS NOTED ON SMEAR, COUNT APPEARS ADEQUATE  --  [id number]    Basic Metabolic Panel:   Recent Labs Lab 07/11/15 1400 07/11/15 1402 07/12/15 0535  NA 142 140 139  K 3.2* 3.9 3.2*  CL 95* 93* 95*  CO2  37*  --  33*  GLUCOSE 100* 101* 115*  BUN 14 22* 18  CREATININE 5.63* 5.10* 7.49*  CALCIUM 8.6*  --  8.4*  PHOS  --   --  4.3    Lipid Panel:     Component Value Date/Time   CHOL 96 07/12/2015 0535   TRIG 79 07/12/2015 0535   HDL 44 07/12/2015 0535   CHOLHDL 2.2 07/12/2015 0535   VLDL 16 07/12/2015 0535   LDLCALC 36 07/12/2015 0535   HgbA1c:  Lab Results  Component Value Date   HGBA1C 6.6* 06/10/2013   Urine Drug Screen: No results found for: LABOPIA, COCAINSCRNUR, LABBENZ, AMPHETMU, THCU, LABBARB    IMAGING  I have personally reviewed the radiological images below and agree with the radiology interpretations.  Dg Chest 2 View 07/11/2015    No acute cardiopulmonary process.     Ct Head Wo Contrast 07/11/2015    Atrophy, chronic ischemic white matter disease and old lacunar infarcts without acute intracranial abnormality. No interval change from 04/25/2015  MRI and MRA pending  CUS - Bilateral: 1-39% ICA stenosis. Vertebral artery flow is antegrade.  2D echo - pending   PHYSICAL EXAM  Temp:  [97.6 F (36.4 C)-99.6 F (37.6 C)] 99.6 F (37.6 C) (09/27 0935)  Pulse Rate:  [51-64] 64 (09/27 0935) Resp:  [10-25] 16 (09/27 0935) BP: (142-213)/(51-74) 142/57 mmHg (09/27 0935) SpO2:  [93 %-100 %] 97 % (09/27 0935) FiO2 (%):  [2 %] 2 % (09/27 0935) Weight:  [225 lb (102.059 kg)] 225 lb (102.059 kg) (09/26 1241)  General - Well nourished, well developed, in no apparent distress.  Ophthalmologic - Fundi not visualized due to small pupils.  Cardiovascular - Regular rate and rhythm.  Mental Status -  Level of arousal and orientation to time, place, and person were intact. Language including expression, naming, repetition, comprehension was assessed and found intact, but mild dysarthria Fund of Knowledge was assessed and was intact.  Cranial Nerves II - XII - II - Visual field intact OU. III, IV, VI - left eye adduction and abduction palsy. Right eye attends to  both sides. V - Facial sensation intact bilaterally. VII - Facial movement intact bilaterally. VIII - Hearing & vestibular intact bilaterally. X - Palate elevates symmetrically, mild dysarthria. XI - Chin turning & shoulder shrug intact bilaterally. XII - Tongue protrusion intact.  Motor Strength - The patient's strength was normal in all extremities and pronator drift was absent.  Bulk was normal and fasciculations were absent.   Motor Tone - Muscle tone was assessed at the neck and appendages and was normal.  Reflexes - The patient's reflexes were 1+ in all extremities and he had no pathological reflexes.  Sensory - Light touch, temperature/pinprick were assessed and were symmetrical.    Coordination - The patient had normal movements in the hands and feet with no ataxia or dysmetria.  Tremor was absent.  Gait and Station - walk with walker in hallway, very slow and cautious gait, mild unsteadiness.   ASSESSMENT/PLAN Ernest [name] is a 69 y.o. male with history of ESRD on HD, HLD, DM, HTN, obesity, hepatitis C, COPD, CAD, previous stroke, and CHF presenting with diplopia with ocular movement abnormality. He did not receive IV t-PA due to late presentation.   Stroke:  Likely pontine infarct due to small vessel disease.  Resultant  diplopia with ocular movement abnormalities.  MRI  not ordered due to implant.   MRA  not ordered  Carotid Doppler unremarkable  2D Echo pending  LDL 36  HgbA1c pending  VTE prophylaxis subcutaneous heparin Diet Carb Modified Fluid consistency:: Thin; Room service appropriate?: Yes with Assist  aspirin 81 mg orally every day and clopidogrel 75 mg orally every day prior to admission, now on aspirin 81 mg orally every day and clopidogrel 75 mg orally every day  Patient counseled to be compliant with his antithrombotic medications  Ongoing aggressive stroke risk factor management  Therapy recommendations: Inpatient rehabilitation has been  recommended.  Disposition:  Pending  Diabetes  HgbA1c pending, goal < 7.0  Controlled  On insulin   SSI  Hypertension  Stable  Permissive hypertension (OK if < 220/120) but gradually normalize in 5-7 days  Hyperlipidemia  Home meds: Zocor 20 mg daily  resumed in hospital  LDL 36, goal < 70  Continue statin on discharge  Other Stroke Risk Factors  Advanced age  Cigarette smoker, quit smoking 42 years ago.  Obesity, Body mass index is 30.51 kg/(m^2).   Hx stroke/TIA  Coronary artery disease  ESRD on HD  Other Active Problems  Hypokalemia  Mild anemia  PLAN  Dr Erlinda Hong recommends CTA of head and neck if MRI cannot be performed. Discussed with New Site Student. He is still working on  getting patient's records. He will discuss possible CTA with team.  Hospital day # [place] [name] [facility] Pager [phone] 07/12/2015, 12:03 PM  I, the attending vascular neurologist, have personally obtained a history, examined the patient, evaluated laboratory data, individually viewed imaging studies and agree with radiology interpretations. I obtained additional history from pt's wife at bedside. I also discussed with pt and wife and teaching service regarding his care plan. Together with the NP/PA, we formulated the assessment and plan of care which reflects our mutual decision.  I have made any additions or clarifications directly to the above note and agree with the findings and plan as currently documented.   69 yo M with multiple stroke risk factors admitted for diplopia. Initially concerning for one and half syndrome. MRI not able to be done due to penile implant. Current exam support left CN VI and MLF involvement, consistent with small pontine infarct. Likely small vessel disease. Continue ASA and plavix. MRI, MRA and 2D echo pending. If MRI not able to be done, he needs CTA head and neck. LDL 36 and A1C pending. Continue statin. Stroke  risk factor modification.  Rosalin Hawking, MD PhD Stroke Neurology 07/12/2015 12:11 PM    To contact Stroke Continuity provider, please refer to [email or web address] After hours, contact General Neurology

## 2015-07-12 NOTE — Progress Notes (Signed)
Subjective: Ernest [name] is a 69 y.o. male with at pertinent PMH significant for stroke, seizures, HTN, DM2 with retinopathy, ESRD on dialysis, and CAD s/p stent who presented to [facility] ED for evaluation of diplopia. Patient states that his last known normal was around 11-12 PM. He awoke at 2 AM with double vision and difficulty walking in a straight line. He states that he then went back to sleep. Upon awaking, symptoms were still present. At dialysis, staff noticed ataxia and EMS was called at 11PM, bringing patient to ED. In addition, patient endorsed bilateral tingling in his hands that has been present for years. He denies facial droop, weakness, dysphagia, dysarthria above baseline, headache, nausea, vomiting, dizziness, lightheadednesschest pain, and SOB. Patient has a 6 year history of smoking 2 cigars/day but quit in 1974. He does not drink or use illicit substances.   Of note, patient is on Plavix and states that he is compliant with all of his medications except ASA. He stopped taking ASA a few weeks ago when he ran out.   In the ED, patient was found to be hypertensive and to have a non-reactive pinpoint pupil on right and CNII palsy on right. CT scan showed no active bleed. Neurology was consulted and recommended admission and MRI w/o contrast. MRI was unable to be performed 2/2 to a penile implant of unknown material.    Overnight: Patient experienced hypoglycemia to 52 @ 0100. Resolved with D25 dextrose.  Subjective: Patient states that overall he is feeling improved. States that diplopia is improving but still feels as if he loses his balance when walking. Denies headache, nausea, vomiting, diarrhea, SOB and chest pain.     Objective: Vital signs in last 24 hours: Filed Vitals:   07/12/15 0000 07/12/15 0200 07/12/15 0400 07/12/15 0600  BP: 154/53 161/65 165/54 153/57  Pulse: 60 54 61 56  Temp: 98.8 F (37.1 C) 98.4 F (36.9 C) 98.4 F (36.9 C) 98.4 F (36.9 C)    TempSrc: Oral Oral Oral Oral  Resp: _0 Height:      Weight:      SpO2: 94% 93% 97% 97%   Weight change:  No intake or output data in the 24 hours ending 07/12/15 0643   Physical Exam  Constitutional: He appears well-nourished. No distress.  HENT:  Head: Normocephalic and atraumatic.  Right Ear: External ear normal.  Left Ear: External ear normal.  Eyes: Right eye exhibits no discharge. Left eye exhibits no discharge. No scleral icterus.  Left pupil pinpoint and non-reactive. Left eye unable to adduct/abbduct.  Neck: Normal range of motion. No JVD present.  Cardiovascular: Normal rate and regular rhythm.   No murmur heard. Pulmonary/Chest: Effort normal. No stridor. No respiratory distress. He has no wheezes.  Abdominal: Soft. Bowel sounds are normal. He exhibits no distension. There is no tenderness.  Musculoskeletal: Normal range of motion. He exhibits no edema.  Lymphadenopathy:    He has no cervical adenopathy.  Neurological: He is alert.  Right eye unable to abbduct/adduct. Rigth pupil pinpoint and unreactive Normal strength in all extremeties No pronator drift, no cerebellar dysfuntion except form moderate dsymetria Negative babinski Sensation intact throughout  Psychiatric: He has a normal mood and affect.    Lab Result  Results for orders placed or performed during the hospital encounter of 07/11/15 (from the past 48 hour(s))  Protime-INR     Status: None   Collection Time: 07/11/15  1:57 PM  Result Value Ref  Range   Prothrombin Time 13.2 11.6 - 15.2 seconds   INR 0.98 0.00 - 1.49  APTT     Status: Abnormal   Collection Time: 07/11/15  1:57 PM  Result Value Ref Range   aPTT 20 (L) 24 - 37 seconds  CBC     Status: Abnormal   Collection Time: 07/11/15  1:57 PM  Result Value Ref Range   WBC 6.6 4.0 - 10.5 K/uL   RBC 4.53 4.22 - 5.81 MIL/uL   Hemoglobin 13.1 13.0 - 17.0 g/dL   HCT 38.5 (L) 39.0 - 52.0 %   MCV 85.0 78.0 - 100.0 fL   MCH 28.9 26.0  - 34.0 pg   MCHC 34.0 30.0 - 36.0 g/dL   RDW 14.9 11.5 - 15.5 %   Platelets  150 - 400 K/uL    PLATELET CLUMPS NOTED ON SMEAR, COUNT APPEARS ADEQUATE  Differential     Status: None   Collection Time: 07/11/15  1:57 PM  Result Value Ref Range   Neutrophils Relative % 64 %   Lymphocytes Relative 22 %   Monocytes Relative 10 %   Eosinophils Relative 3 %   Basophils Relative 1 %   Neutro Abs 4.1 1.7 - 7.7 K/uL   Lymphs Abs 1.5 0.7 - 4.0 K/uL   Monocytes Absolute 0.7 0.1 - 1.0 K/uL   Eosinophils Absolute 0.2 0.0 - 0.7 K/uL   Basophils Absolute 0.1 0.0 - 0.1 K/uL   Smear Review FIBRIN STRANDS NOTED   Ethanol     Status: None   Collection Time: 07/11/15  2:00 PM  Result Value Ref Range   Alcohol, Ethyl (B) <5 <5 mg/dL    Comment:        LOWEST DETECTABLE LIMIT FOR SERUM ALCOHOL IS 5 mg/dL FOR MEDICAL PURPOSES ONLY   I-stat troponin, ED (not at [facility], [facility])     Status: None   Collection Time: 07/11/15  2:00 PM  Result Value Ref Range   Troponin i, poc 0.02 0.00 - 0.08 ng/mL   Comment 3            Comment: Due to the release kinetics of cTnI, a negative result within the first hours of the onset of symptoms does not rule out myocardial infarction with certainty. If myocardial infarction is still suspected, repeat the test at appropriate intervals.   Comprehensive metabolic panel     Status: Abnormal   Collection Time: 07/11/15  2:00 PM  Result Value Ref Range   Sodium 142 135 - 145 mmol/L   Potassium 3.2 (L) 3.5 - 5.1 mmol/L   Chloride 95 (L) 101 - 111 mmol/L   CO2 37 (H) 22 - 32 mmol/L   Glucose, Bld 100 (H) 65 - 99 mg/dL   BUN 14 6 - 20 mg/dL   Creatinine, Ser 5.63 (H) 0.61 - 1.24 mg/dL   Calcium 8.6 (L) 8.9 - 10.3 mg/dL   Total Protein 6.5 6.5 - 8.1 g/dL   Albumin 3.4 (L) 3.5 - 5.0 g/dL   AST 19 15 - 41 U/L   ALT 16 (L) 17 - 63 U/L   Alkaline Phosphatase 54 38 - 126 U/L   Total Bilirubin 0.4 0.3 - 1.2 mg/dL   GFR calc non Af Amer 9 (L) >60 mL/min   GFR calc Af Amer  11 (L) >60 mL/min    Comment: (NOTE) The eGFR has been calculated using the CKD EPI equation. This calculation has not been validated in all clinical situations.  eGFR's persistently <60 mL/min signify possible Chronic Kidney Disease.    Anion gap 10 5 - 15  I-Stat Chem 8, ED  (not at [facility], [facility])     Status: Abnormal   Collection Time: 07/11/15  2:02 PM  Result Value Ref Range   Sodium 140 135 - 145 mmol/L   Potassium 3.9 3.5 - 5.1 mmol/L   Chloride 93 (L) 101 - 111 mmol/L   BUN 22 (H) 6 - 20 mg/dL   Creatinine, Ser 5.10 (H) 0.61 - 1.24 mg/dL   Glucose, Bld 101 (H) 65 - 99 mg/dL   Calcium, Ion 0.96 (L) 1.13 - 1.30 mmol/L   TCO2 37 0 - 100 mmol/L   Hemoglobin 13.6 13.0 - 17.0 g/dL   HCT 40.0 39.0 - 52.0 %  Glucose, capillary     Status: None   Collection Time: 07/11/15  6:24 PM  Result Value Ref Range   Glucose-Capillary 72 65 - 99 mg/dL   Comment 1 Notify RN    Comment 2 Document in Chart   Glucose, capillary     Status: None   Collection Time: 07/11/15  9:03 PM  Result Value Ref Range   Glucose-Capillary 74 65 - 99 mg/dL   Comment 1 Notify RN    Comment 2 Document in Chart   Glucose, capillary     Status: Abnormal   Collection Time: 07/12/15 12:11 AM  Result Value Ref Range   Glucose-Capillary 56 (L) 65 - 99 mg/dL   Comment 1 Notify RN    Comment 2 Document in Chart   Glucose, capillary     Status: Abnormal   Collection Time: 07/12/15 12:40 AM  Result Value Ref Range   Glucose-Capillary 112 (H) 65 - 99 mg/dL  Glucose, capillary     Status: Abnormal   Collection Time: 07/12/15  4:20 AM  Result Value Ref Range   Glucose-Capillary 52 (L) 65 - 99 mg/dL   Comment 1 Notify RN    Comment 2 Document in Chart   Glucose, capillary     Status: Abnormal   Collection Time: 07/12/15  4:47 AM  Result Value Ref Range   Glucose-Capillary 132 (H) 65 - 99 mg/dL  CBC     Status: Abnormal   Collection Time: 07/12/15  5:35 AM  Result Value Ref Range   WBC 6.5 4.0 - 10.5 K/uL   RBC  4.21 (L) 4.22 - 5.81 MIL/uL   Hemoglobin 12.1 (L) 13.0 - 17.0 g/dL   HCT 36.3 (L) 39.0 - 52.0 %   MCV 86.2 78.0 - 100.0 fL   MCH 28.7 26.0 - 34.0 pg   MCHC 33.3 30.0 - 36.0 g/dL   RDW 14.8 11.5 - 15.5 %   Platelets 156 150 - 400 K/uL    Micro Results: No results found for this or any previous visit (from the past 240 hour(s)). Studies/Results: Dg Chest 2 View  07/11/2015   CLINICAL DATA:  Left eye vision changes today. Hemodialysis patient. Initial encounter.  EXAM: CHEST  2 VIEW  COMPARISON:  04/29/2015 and 12/05/2014.  FINDINGS: The heart size and mediastinal contours are stable. Right axillary vascular stent noted. The overall basilar aeration has improved. There is no confluent airspace opacity, edema or significant pleural effusion. The bones appear unchanged.  IMPRESSION: No acute cardiopulmonary process.   Electronically Signed   By: Richardean Sale M.D.   On: 07/11/2015 16:57   Ct Head Wo Contrast  07/11/2015   CLINICAL DATA:  Intracranial hemorrhage. LEFT  pupil nonreactive. Stroke with speech changes.  EXAM: CT HEAD WITHOUT CONTRAST  TECHNIQUE: Contiguous axial images were obtained from the base of the skull through the vertex without intravenous contrast.  COMPARISON:  04/25/2015.  FINDINGS: No mass lesion, mass effect, midline shift, hydrocephalus, hemorrhage. No acute territorial cortical ischemia/infarct. Atrophy and chronic ischemic white matter disease is present. Benign basal ganglia calcifications along with scattered parenchymal calcifications. These are chronic findings and appears similar to the prior exam. Old lacunar infarct in the RIGHT cerebellar hemisphere. Visible paranasal sinuses appear normal. Dense intracranial atherosclerosis.  IMPRESSION: Atrophy, chronic ischemic white matter disease and old lacunar infarcts without acute intracranial abnormality. No interval change from 04/25/2015.   Electronically Signed   By: Dereck Ligas M.D.   On: 07/11/2015 13:49    Medications: I have reviewed the patient's current medications. Scheduled Meds: . allopurinol  100 mg Oral Daily  . amitriptyline  25 mg Oral QHS  . aspirin EC  81 mg Oral Daily  . aspirin  300 mg Rectal Once  . calcium acetate  2,001 mg Oral TID WC  . cinacalcet  60 mg Oral BID WC  . clopidogrel  75 mg Oral Daily  . colchicine  0.6 mg Oral Daily  . dextrose      . dextrose      . heparin  5,000 Units Subcutaneous 3 times per day  . insulin aspart  0-9 Units Subcutaneous TID WC  . insulin NPH Human  8 Units Subcutaneous BID AC & HS  . levETIRAcetam  1,000 mg Intravenous Q24H  . multivitamin  1 tablet Oral QHS  . sevelamer carbonate  3,200 mg Oral TID WC  . simvastatin  20 mg Oral q1800   Continuous Infusions:  PRN Meds:.senna-docusate, sevelamer carbonate Assessment/Plan: Principal Problem:   Diplopia Active Problems:   Stroke   Assessment: ARLESTER KEEHAN is a 69 y.o. male with a hx of stroke, seizure, CAD, HTN, and ESRD who presents with symptoms concerning for CVA.  ##Possible [place] CVA- Patient outside of TPA window. Non-contrast CT scan show no acute process. MRI not done due to unknown implant status. Improving. -Clopidogrel 67m -ASA  81 mg PO daily  -f/u Echocardiogram  -f/u CT head and neck w/ contrast as per neuro recs  -[facility]  ##Chronic CHF -Hold Antihypertensives to allow permissive HTN to 220/120  #CAD:  -ASA  -Clopidogrel 75 mg daily   #Seizures:  -IV Keppra 1000 mg daily   ##Dysphagia: -Carb modified Fluid Consistency diet  -Can take PO meds   ##Diabetes Mellitus, Type II -Insulin NPH Human 8 unit IV BID -SSI  - f/u HgA1C pending   ##Hypoglycemia: Resolved, likely 2/2 to NPO status. Patient on PO diet now.   ##HTN -Hold home meds. -Permissive HTN up to 220/120  ##ESRD:  -Dialysis M/W/F -Cinacalcet 60 mg PO for secondary hyperparathyroism -Calcium Acetate 2,001 PO 3x  daily -Sevelamer carbonate 80 -RENA-VIT- 1 tablet  -Monitor Intake/Output daily -Contacted Dr. MMercy Moorewith [place] need for [place]  #HLD -simvastatin 20 mg PO daily   ##Gout -Allopurinol 1057mdaily -Cochicine 0.6 mg PO daily  #Infectous Disease- History of Hep C -Hep C antibody negative  -HIV Antibody  #GI -Ducosate 1 tablet PO at bedtime  This is a [employer] information officerote.  The care of the patient was discussed with Dr. ViEvette Doffingnd the assessment and plan formulated with their assistance.  Please see their  attached note for official documentation of the daily encounter.   LOS: 1 day   Florentina Addison, Med Student 07/12/2015, 6:43 AM

## 2015-07-12 NOTE — Consult Note (Signed)
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 right handed male with history of end-stage renal disease with hemodialysis, hypertension, diabetes mellitus peripheral neuropathy, CAD with stenting and CVA with little residual weakness . Patient had been on aspirin and Plavix but ran out of aspirin was taking only Plavix. Presented 07/11/2015 with diplopia and slurred speech. Lives with spouse independent with single-point cane prior to admission. Wife is physically limited to provide  assistance. Cranial CT scan with chronic ischemic white matter disease old lacunar infarct without acute intracranial abnormality. Echocardiogram is pending. Carotid Dopplers with no ICA stenosis. MRI of the brain not completed due to history of penile implant. CT angiogram head and neck are pending. Neurology consulted presently remains on aspirin and Plavix. Subcutaneous heparin for DVT prophylaxis. Regular consistency diet. Physical and occupational therapy evaluation completed with recommendations of physical medicine rehabilitation consult.   Review of Systems  Constitutional: Negative for fever and chills.  HENT: Negative for hearing loss.   Eyes: Positive for blurred vision and double vision.  Respiratory: Negative for cough.        Shortness of breath on exertion.  Cardiovascular: Negative for chest pain, palpitations and leg swelling.  Gastrointestinal: Positive for constipation. Negative for nausea, vomiting and abdominal pain.  Genitourinary: Negative for dysuria and hematuria.  Musculoskeletal: Positive for myalgias and joint pain.  Skin: Negative for rash.  Neurological: Positive for weakness. Negative for seizures, loss of consciousness and headaches.   Past Medical History  Diagnosis Date  . Hyperlipidemia   . Retinopathy   . Gout   . ESRD (end stage renal  disease)     Started HD in [place] in 2009, ESRD was due to DM. Moved to [facility] in Dec 2009 and now gets dialysis at [facility] on a MWF schedule.     . Type II or unspecified type diabetes mellitus without mention of complication, not stated as uncontrolled     adult onset  . Erectile dysfunction     penile implant  . Abnormal stress test     s/p cath November 2013 with modest disease involving the ostial left main, proximal LAD, proximal RCA - do not appear to be hemodynamically signficant; mild LV dysfunction  . Obesity   . Hypertension   . Hepatitis C   . COPD (chronic obstructive pulmonary disease)   . Coronary artery disease     per cath report 2013  . Ejection fraction < 50%   . Chronic systolic CHF (congestive heart failure)   . Bacteremia   . Shortness of breath     Hx: of with exertion  . Gout     Hx: of  . Stroke    Past Surgical History  Procedure Laterality Date  . Penile prosthesis implant      1997.. no card  . Fistula      RUE and wrist  . Cardiac catheterization  August 26, 2012  . Eye surgery      laser.  and surgery for DM  . Insertion of dialysis catheter Right 01/01/2013    Procedure: INSERTION OF DIALYSIS CATHETER right  internal jugular;  Surgeon: Serafina Mitchell, MD;  Location: [place];  Service: Vascular;  Laterality: Right;  . Av fistula placement Left 01/13/2013    Procedure: INSERTION OF ARTERIOVENOUS (AV) GORE-TEX GRAFT ARM;  Surgeon: Angelia Mould,  MD;  Location: [place];  Service: Vascular;  Laterality: Left;  . Thrombectomy w/ embolectomy Left 03/26/2013    Procedure: Attempted thrombectomy of left arm arteriovenous goretex graft.;  Surgeon: Angelia Mould, MD;  Location: [place];  Service: Vascular;  Laterality: Left;  . Insertion of dialysis catheter N/A 03/26/2013    Procedure: INSERTION OF DIALYSIS CATHETER Left internal jugular vein;  Surgeon: Angelia Mould, MD;  Location: [place];  Service: Vascular;  Laterality: N/A;  . Avgg  removal Left 03/26/2013    Procedure: REMOVAL OF  NON INCORPORATED ARTERIOVENOUS GORETEX GRAFT ([place]) left arm * repair of  left brachial artery with vein patch angioplasty.;  Surgeon: Angelia Mould, MD;  Location: [place];  Service: Vascular;  Laterality: Left;  . Av fistula placement Left 05/05/2013    Procedure: INSERTION OF LEFT UPPER ARM  ARTERIOVENOUS GORTEX GRAFT;  Surgeon: Angelia Mould, MD;  Location: [place];  Service: Vascular;  Laterality: Left;  . Tee without cardioversion N/A 06/11/2013    Procedure: TRANSESOPHAGEAL ECHOCARDIOGRAM (TEE);  Surgeon: Larey Dresser, MD;  Location: [place];  Service: Cardiovascular;  Laterality: N/A;  . Embolectomy Right 08/27/2013    Procedure: EMBOLECTOMY;  Surgeon: Mal Misty, MD;  Location: [place];  Service: Vascular;  Laterality: Right;  Thrombectomy of Radial and ulnar artery.  . Cataract extraction w/ intraocular lens  implant, bilateral    . Colonoscopy    . Ligation of arteriovenous  fistula Right 12/30/2013    Procedure: REMOVAL OF SEGMENT OF GORTEX GRAFT AND FISTULA  AND REPAIR OF BRACHIAL ARTERY;  Surgeon: Mal Misty, MD;  Location: [place];  Service: Vascular;  Laterality: Right;  Annell Greening N/A 04/07/2013    Procedure: VENOGRAM;  Surgeon: Serafina Mitchell, MD;  Location: [facility] CATH LAB;  Service: Cardiovascular;  Laterality: N/A;   Family History  Problem Relation Age of Onset  . Heart disease Father   . Diabetes Sister   . Diabetes Brother   . Diabetes Son    Social History:  reports that he quit smoking about 42 years ago. He has never used smokeless tobacco. He reports that he does not drink alcohol or use illicit drugs. Allergies: [name] Medications Prior to Admission  Medication Sig Dispense Refill  . allopurinol (ZYLOPRIM) 100 MG tablet Take 100 mg by mouth daily.     [name] amitriptyline (ELAVIL) 25 MG tablet TAKE ONE TABLET BY MOUTH AT BEDTIME FOR  NEUROPATHY (Patient taking differently: TAKE ONE TABLET  BY MOUTH AT BEDTIME AS NEED  FOR  NEUROPATHY) 90 tablet 0  . aspirin EC 81 MG tablet Take 81 mg by mouth daily.    . Calcium Acetate 667 MG TABS Take 2,001 tablets by mouth 3 (three) times daily with meals.     . cinacalcet (SENSIPAR) 60 MG tablet Take 60 mg by mouth 2 (two) times daily.     . clopidogrel (PLAVIX) 75 MG tablet Take 75 mg by mouth daily.     . colchicine 0.6 MG tablet Take 0.6 mg by mouth daily.     . insulin NPH Human (HUMULIN N,NOVOLIN N) 100 UNIT/ML injection Inject 0.1 mLs (10 Units total) into the skin 2 (two) times daily. 10 mL 11  . levETIRAcetam (KEPPRA) 500 MG tablet Take 2 tablets (1,000 mg total) by mouth daily. Then take extra 500 mg (1 tablet) on MWF after HD 72 tablet 5  . lidocaine-prilocaine (EMLA) cream Apply 1 application topically as needed (  dialysis).    [name] lisinopril (PRINIVIL,ZESTRIL) 20 MG tablet Take 20 mg by mouth daily.    . metoprolol (LOPRESSOR) 50 MG tablet Take 50 mg by mouth daily.     . multivitamin (RENA-VIT) TABS tablet Take 1 tablet by mouth daily.     . sevelamer (RENVELA) 800 MG tablet Take 800-3,200 mg by mouth 5 (five) times daily. 4 tablets three times daily with meals and 1 tablet twice daily with snacks.    . simvastatin (ZOCOR) 20 MG tablet Take 20 mg by mouth daily at 6 PM.       Home: Home Living Family/patient expects to be discharged to:: Private residence Living Arrangements: Spouse/significant other Available Help at Discharge: Family Type of Home: House Home Access: Stairs to enter Technical brewer of Steps: 1 Home Layout: Two level, 1/2 bath on main level Alternate Level Stairs-Number of Steps: 14 Alternate Level Stairs-Rails: Left Bathroom Shower/Tub: Tub/shower unit (garden tub) Biochemist, clinical: Standard Home Equipment: Environmental consultant - 2 wheels, Radio producer - single point, Civil engineer, contracting  Lives With: Spouse, Daughter  Functional History: Prior Function Level of Independence: Independent Comments: pt was bathing, dressing,  ambulating without AD. Wife has a bad back and cannot physically assist pt, wife drives. Functional Status:  Mobility: Bed Mobility Overal bed mobility: Needs Assistance Bed Mobility: Supine to Sit Supine to sit: Supervision General bed mobility comments: pt in chair Transfers Overall transfer level: Needs assistance Equipment used: Rolling walker (2 wheeled) Transfers: Sit to/from Stand Sit to Stand: Min assist General transfer comment: posterior bias initially, min assist for balance, verbal cues for hand placement Ambulation/Gait Ambulation/Gait assistance: Min assist, Mod assist Ambulation Distance (Feet): 120 Feet Assistive device: Rolling walker (2 wheeled), None Gait Pattern/deviations: Step-through pattern, Wide base of support General Gait Details: attempted to ambulate without AD however pt required modA to maintain balance and was reaching for hallway rail. pt with noted difficulty with spatial awareness, likely due to vision deficits. once given RW pt with increased stabilty however still required minA during turning for safety Gait velocity: slow    ADL: ADL Overall ADL's : Needs assistance/impaired Eating/Feeding Details (indicate cue type and reason): awaiting meal tray Grooming: Wash/dry hands, Wash/dry face, Standing, Minimal assistance Grooming Details (indicate cue type and reason): cues for locating paper towels, soap  Upper Body Bathing: Minimal assitance, Sitting Upper Body Bathing Details (indicate cue type and reason): assisted with back Lower Body Bathing: Minimal assistance, Sit to/from stand Upper Body Dressing : Minimal assistance, Sitting Upper Body Dressing Details (indicate cue type and reason): assist to orient Lower Body Dressing: Minimal assistance, Sit to/from stand Lower Body Dressing Details (indicate cue type and reason): able to don and doff socks, steadying assist needed to pull up pants Toilet Transfer: Minimal assistance, RW, Ambulation,  Regular Toilet Toileting- Clothing Manipulation and Hygiene: Minimal assistance, Sit to/from stand Functional mobility during ADLs: Minimal assistance, Rolling walker  Cognition: Cognition Overall Cognitive Status: History of cognitive impairments - at baseline (6th grade education, wife states he is at baseline) Arousal/Alertness: Awake/alert Orientation Level: Oriented X4 Memory: Impaired Memory Impairment: Storage deficit, Retrieval deficit, Decreased recall of new information (pt did not recall having CT scan ) Awareness: Appears intact (aware to visual deficits) Problem Solving: Appears intact (for basic to call for assist and not try to get OOB on his own) Cognition Arousal/Alertness: Awake/alert Behavior During Therapy: WFL for tasks assessed/performed Overall Cognitive Status: History of cognitive impairments - at baseline (6th grade education, wife states he is at  baseline)  Blood pressure 142/57, pulse 64, temperature 99.6 F (37.6 C), temperature source Oral, resp. rate 16, height 6' (1.829 m), weight 102.059 kg (225 lb), SpO2 97 %. Physical Exam  Constitutional: He appears well-developed and well-nourished.  HENT:  Head: Normocephalic and atraumatic.  Eyes: No scleral icterus.  Pupils reactive to light without nystagmus  Neck: Normal range of motion. Neck supple. No JVD present. No tracheal deviation present. No thyromegaly present.  Cardiovascular: Normal rate and regular rhythm.   Respiratory: Effort normal and breath sounds normal. No respiratory distress.  GI: Soft. Bowel sounds are normal. He exhibits no distension.  Neurological: He is alert.  Has difficulty tracking to the left. Left CN6 palsy. Appears to track in other directions. Past points with both hands, left more than right. ?ataxic. No PD.  Speech is a bit dysarthric but intelligible. Fair awareness of deficits. Follows commands. MMT: 4+ to 5/5 bilateral UE. LE: 4/5 HF, KE and ADF/apf. Decreased LT in both  feet.   Skin: Skin is warm and dry.  Psychiatric: He has a normal mood and affect. His behavior is normal.    Results for orders placed or performed during the hospital encounter of 07/11/15 (from the past 24 hour(s))  Protime-INR     Status: None   Collection Time: 07/11/15  1:57 PM  Result Value Ref Range   Prothrombin Time 13.2 11.6 - 15.2 seconds   INR 0.98 0.00 - 1.49  APTT     Status: Abnormal   Collection Time: 07/11/15  1:57 PM  Result Value Ref Range   aPTT 20 (L) 24 - 37 seconds  CBC     Status: Abnormal   Collection Time: 07/11/15  1:57 PM  Result Value Ref Range   WBC 6.6 4.0 - 10.5 K/uL   RBC 4.53 4.22 - 5.81 MIL/uL   Hemoglobin 13.1 13.0 - 17.0 g/dL   HCT 38.5 (L) 39.0 - 52.0 %   MCV 85.0 78.0 - 100.0 fL   MCH 28.9 26.0 - 34.0 pg   MCHC 34.0 30.0 - 36.0 g/dL   RDW 14.9 11.5 - 15.5 %   Platelets  150 - 400 K/uL    PLATELET CLUMPS NOTED ON SMEAR, COUNT APPEARS ADEQUATE  Differential     Status: None   Collection Time: 07/11/15  1:57 PM  Result Value Ref Range   Neutrophils Relative % 64 %   Lymphocytes Relative 22 %   Monocytes Relative 10 %   Eosinophils Relative 3 %   Basophils Relative 1 %   Neutro Abs 4.1 1.7 - 7.7 K/uL   Lymphs Abs 1.5 0.7 - 4.0 K/uL   Monocytes Absolute 0.7 0.1 - 1.0 K/uL   Eosinophils Absolute 0.2 0.0 - 0.7 K/uL   Basophils Absolute 0.1 0.0 - 0.1 K/uL   Smear Review FIBRIN STRANDS NOTED   Ethanol     Status: None   Collection Time: 07/11/15  2:00 PM  Result Value Ref Range   Alcohol, Ethyl (B) <5 <5 mg/dL  I-stat troponin, ED (not at [facility], [facility])     Status: None   Collection Time: 07/11/15  2:00 PM  Result Value Ref Range   Troponin i, poc 0.02 0.00 - 0.08 ng/mL   Comment 3          Comprehensive metabolic panel     Status: Abnormal   Collection Time: 07/11/15  2:00 PM  Result Value Ref Range   Sodium 142 135 - 145 mmol/L  Potassium 3.2 (L) 3.5 - 5.1 mmol/L   Chloride 95 (L) 101 - 111 mmol/L   CO2 37 (H) 22 - 32 mmol/L    Glucose, Bld 100 (H) 65 - 99 mg/dL   BUN 14 6 - 20 mg/dL   Creatinine, Ser 5.63 (H) 0.61 - 1.24 mg/dL   Calcium 8.6 (L) 8.9 - 10.3 mg/dL   Total Protein 6.5 6.5 - 8.1 g/dL   Albumin 3.4 (L) 3.5 - 5.0 g/dL   AST 19 15 - 41 U/L   ALT 16 (L) 17 - 63 U/L   Alkaline Phosphatase 54 38 - 126 U/L   Total Bilirubin 0.4 0.3 - 1.2 mg/dL   GFR calc non Af Amer 9 (L) >60 mL/min   GFR calc Af Amer 11 (L) >60 mL/min   Anion gap 10 5 - 15  I-Stat Chem 8, ED  (not at [facility], [facility])     Status: Abnormal   Collection Time: 07/11/15  2:02 PM  Result Value Ref Range   Sodium 140 135 - 145 mmol/L   Potassium 3.9 3.5 - 5.1 mmol/L   Chloride 93 (L) 101 - 111 mmol/L   BUN 22 (H) 6 - 20 mg/dL   Creatinine, Ser 5.10 (H) 0.61 - 1.24 mg/dL   Glucose, Bld 101 (H) 65 - 99 mg/dL   Calcium, Ion 0.96 (L) 1.13 - 1.30 mmol/L   TCO2 37 0 - 100 mmol/L   Hemoglobin 13.6 13.0 - 17.0 g/dL   HCT 40.0 39.0 - 52.0 %  Glucose, capillary     Status: None   Collection Time: 07/11/15  6:24 PM  Result Value Ref Range   Glucose-Capillary 72 65 - 99 mg/dL   Comment 1 Notify RN    Comment 2 Document in Chart   Glucose, capillary     Status: None   Collection Time: 07/11/15  9:03 PM  Result Value Ref Range   Glucose-Capillary 74 65 - 99 mg/dL   Comment 1 Notify RN    Comment 2 Document in Chart   Glucose, capillary     Status: Abnormal   Collection Time: 07/12/15 12:11 AM  Result Value Ref Range   Glucose-Capillary 56 (L) 65 - 99 mg/dL   Comment 1 Notify RN    Comment 2 Document in Chart   Glucose, capillary     Status: Abnormal   Collection Time: 07/12/15 12:40 AM  Result Value Ref Range   Glucose-Capillary 112 (H) 65 - 99 mg/dL  Glucose, capillary     Status: Abnormal   Collection Time: 07/12/15  4:20 AM  Result Value Ref Range   Glucose-Capillary 52 (L) 65 - 99 mg/dL   Comment 1 Notify RN    Comment 2 Document in Chart   Glucose, capillary     Status: Abnormal   Collection Time: 07/12/15  4:47 AM  Result Value Ref  Range   Glucose-Capillary 132 (H) 65 - 99 mg/dL  TSH     Status: None   Collection Time: 07/12/15  5:35 AM  Result Value Ref Range   TSH 1.064 0.350 - 4.500 uIU/mL  Renal function panel     Status: Abnormal   Collection Time: 07/12/15  5:35 AM  Result Value Ref Range   Sodium 139 135 - 145 mmol/L   Potassium 3.2 (L) 3.5 - 5.1 mmol/L   Chloride 95 (L) 101 - 111 mmol/L   CO2 33 (H) 22 - 32 mmol/L   Glucose, Bld 115 (H) 65 -  99 mg/dL   BUN 18 6 - 20 mg/dL   Creatinine, Ser 7.49 (H) 0.61 - 1.24 mg/dL   Calcium 8.4 (L) 8.9 - 10.3 mg/dL   Phosphorus 4.3 2.5 - 4.6 mg/dL   Albumin 3.2 (L) 3.5 - 5.0 g/dL   GFR calc non Af Amer 7 (L) >60 mL/min   GFR calc Af Amer 8 (L) >60 mL/min   Anion gap 11 5 - 15  CBC     Status: Abnormal   Collection Time: 07/12/15  5:35 AM  Result Value Ref Range   WBC 6.5 4.0 - 10.5 K/uL   RBC 4.21 (L) 4.22 - 5.81 MIL/uL   Hemoglobin 12.1 (L) 13.0 - 17.0 g/dL   HCT 36.3 (L) 39.0 - 52.0 %   MCV 86.2 78.0 - 100.0 fL   MCH 28.7 26.0 - 34.0 pg   MCHC 33.3 30.0 - 36.0 g/dL   RDW 14.8 11.5 - 15.5 %   Platelets 156 150 - 400 K/uL  Lipid panel     Status: None   Collection Time: 07/12/15  5:35 AM  Result Value Ref Range   Cholesterol 96 0 - 200 mg/dL   Triglycerides 79 <150 mg/dL   HDL 44 >40 mg/dL   Total CHOL/HDL Ratio 2.2 RATIO   VLDL 16 0 - 40 mg/dL   LDL Cholesterol 36 0 - 99 mg/dL  Glucose, capillary     Status: None   Collection Time: 07/12/15  7:39 AM  Result Value Ref Range   Glucose-Capillary 73 65 - 99 mg/dL   Comment 1 Notify RN    Comment 2 Document in Chart   Glucose, capillary     Status: Abnormal   Collection Time: 07/12/15 11:29 AM  Result Value Ref Range   Glucose-Capillary 163 (H) 65 - 99 mg/dL   Dg Chest 2 View  07/11/2015   CLINICAL DATA:  Left eye vision changes today. Hemodialysis patient. Initial encounter.  EXAM: CHEST  2 VIEW  COMPARISON:  04/29/2015 and 12/05/2014.  FINDINGS: The heart size and mediastinal contours are stable.  Right axillary vascular stent noted. The overall basilar aeration has improved. There is no confluent airspace opacity, edema or significant pleural effusion. The bones appear unchanged.  IMPRESSION: No acute cardiopulmonary process.   Electronically Signed   By: Richardean Sale M.D.   On: 07/11/2015 16:57   Ct Head Wo Contrast  07/11/2015   CLINICAL DATA:  Intracranial hemorrhage. LEFT pupil nonreactive. Stroke with speech changes.  EXAM: CT HEAD WITHOUT CONTRAST  TECHNIQUE: Contiguous axial images were obtained from the base of the skull through the vertex without intravenous contrast.  COMPARISON:  04/25/2015.  FINDINGS: No mass lesion, mass effect, midline shift, hydrocephalus, hemorrhage. No acute territorial cortical ischemia/infarct. Atrophy and chronic ischemic white matter disease is present. Benign basal ganglia calcifications along with scattered parenchymal calcifications. These are chronic findings and appears similar to the prior exam. Old lacunar infarct in the RIGHT cerebellar hemisphere. Visible paranasal sinuses appear normal. Dense intracranial atherosclerosis.  IMPRESSION: Atrophy, chronic ischemic white matter disease and old lacunar infarcts without acute intracranial abnormality. No interval change from 04/25/2015.   Electronically Signed   By: Dereck Ligas M.D.   On: 07/11/2015 13:49    Assessment/Plan: Diagnosis: Pontine infarct with diplopia and balance deficits.  1. Does the need for close, 24 hr/day medical supervision in concert with the patient's rehab needs make it unreasonable for this patient to be served in a less intensive setting? Yes 2.  Co-Morbidities requiring supervision/potential complications: ESRD, gout copd, dm2 with neuropathy, htn 3. Due to bladder management, bowel management, safety, skin/wound care, disease management, medication administration, pain management and patient education, does the patient require 24 hr/day rehab nursing? Yes 4. Does the  patient require coordinated care of a physician, rehab nurse, PT (1-2 hrs/day, 5 days/week), OT (1-2 hrs/day, 5 days/week) and SLP (1-2 hrs/day, 5 days/week) to address physical and functional deficits in the context of the above medical diagnosis(es)? Yes Addressing deficits in the following areas: balance, endurance, locomotion, strength, transferring, bowel/bladder control, bathing, dressing, feeding, grooming, toileting, speech and psychosocial support 5. Can the patient actively participate in an intensive therapy program of at least 3 hrs of therapy per day at least 5 days per week? Yes 6. The potential for patient to make measurable gains while on inpatient rehab is excellent 7. Anticipated functional outcomes upon discharge from inpatient rehab are modified independent and supervision  with PT, modified independent and supervision with OT, modified independent with SLP. 8. Estimated rehab length of stay to reach the above functional goals is: 8-12 days 9. Does the patient have adequate social supports and living environment to accommodate these discharge functional goals? Yes 10. Anticipated D/C setting: Home 11. Anticipated post D/C treatments: HH therapy and Outpatient therapy 12. Overall Rehab/Functional Prognosis: excellent  RECOMMENDATIONS: This patient's condition is appropriate for continued rehabilitative care in the following setting: CIR Patient has agreed to participate in recommended program. Yes Note that insurance prior authorization may be required for reimbursement for recommended care.  Comment: Rehab Admissions Coordinator to follow up.  Thanks,  [name], MD, Mellody Drown     07/12/2015

## 2015-07-12 NOTE — Evaluation (Signed)
Occupational Therapy Evaluation Patient Details Name: Ernest [name] MRN: [id number] DOB: [birth year] Today's Date: 07/12/2015    History of Present Illness Ernest [name] is a 69 y.o. male with at pertinent PMH significant for stroke, seizures, HTN, DM2 with retinopathy, ESRD on dialysis, and CAD s/p stent who presented to [facility] ED for evaluation of diplopia. CT scan unremarkable.    Clinical Impression   Pt was independent prior to admission in self care and mobility.  Pt presents with vision deficits, decreased balance, and generalized weakness interfering with ability to perform ADL and mobility. Pt has good potential to return to a supervision to modified independent level with intensive rehab.  Recommending CIR. Will follow acutely.    Follow Up Recommendations  CIR    Equipment Recommendations       Recommendations for Other Services       Precautions / Restrictions Precautions Precautions: Fall Precaution Comments: vision deficits Restrictions Weight Bearing Restrictions: No      Mobility Bed Mobility   General bed mobility comments: pt in chair  Transfers Overall transfer level: Needs assistance Equipment used: Rolling walker (2 wheeled) Transfers: Sit to/from Stand Sit to Stand: Min assist         General transfer comment: posterior bias initially, min assist for balance, verbal cues for hand placement    Balance Overall balance assessment: Needs assistance Sitting-balance support: Feet supported Sitting balance-Leahy Scale: Good     Standing balance support: Bilateral upper extremity supported Standing balance-Leahy Scale: Poor Standing balance comment: leans against sink with grooming                            ADL Overall ADL's : Needs assistance/impaired   Eating/Feeding Details (indicate cue type and reason): awaiting meal tray Grooming: Wash/dry hands;Wash/dry face;Standing;Minimal assistance Grooming Details (indicate  cue type and reason): cues for locating paper towels, soap  Upper Body Bathing: Minimal assitance;Sitting Upper Body Bathing Details (indicate cue type and reason): assisted with back Lower Body Bathing: Minimal assistance;Sit to/from stand   Upper Body Dressing : Minimal assistance;Sitting Upper Body Dressing Details (indicate cue type and reason): assist to orient Lower Body Dressing: Minimal assistance;Sit to/from stand Lower Body Dressing Details (indicate cue type and reason): able to don and doff socks, steadying assist needed to pull up pants Toilet Transfer: Minimal assistance;RW;Ambulation;Regular Toilet   Toileting- Clothing Manipulation and Hygiene: Minimal assistance;Sit to/from stand       Functional mobility during ADLs: Minimal assistance;Rolling walker       Vision Vision Assessment?: Yes Eye Alignment: Within Functional Limits Ocular Range of Motion: Restricted on the left Tracking/Visual Pursuits: Left eye does not track laterally;Left eye does not track medially Convergence: Impaired (comment) Depth Perception: Overshoots;Undershoots   Perception     Praxis      Pertinent Vitals/Pain Pain Assessment: No/denies pain     Hand Dominance Right   Extremity/Trunk Assessment Upper Extremity Assessment Upper Extremity Assessment: RUE deficits/detail;LUE deficits/detail RUE Deficits / Details: 4/5 strength RUE Coordination: decreased gross motor LUE Deficits / Details: 4/5 strength, dialysis graft LUE Coordination: decreased gross motor   Lower Extremity Assessment Lower Extremity Assessment: Overall WFL for tasks assessed   Cervical / Trunk Assessment Cervical / Trunk Assessment: Normal   Communication Communication Communication: Expressive difficulties (pt's wife states he is at baseline)   Cognition Arousal/Alertness: Awake/alert Behavior During Therapy: WFL for tasks assessed/performed Overall Cognitive Status: History of cognitive impairments -  at  baseline (6th grade education, wife states he is at baseline)                     General Comments       Exercises       Shoulder Instructions      Home Living Family/patient expects to be discharged to:: Private residence Living Arrangements: Spouse/significant other Available Help at Discharge: Family Type of Home: House Home Access: Stairs to enter Technical brewer of Steps: 1   Home Layout: Two level;1/2 bath on main level Alternate Level Stairs-Number of Steps: 14 Alternate Level Stairs-Rails: Left Bathroom Shower/Tub: Tub/shower unit (garden tub)   Bathroom Toilet: [place]: Environmental consultant - 2 wheels;Cane - single point;Shower seat      Lives With: Spouse;Daughter    Prior Functioning/Environment Level of Independence: Independent        Comments: pt was bathing, dressing, ambulating without AD. Wife has a bad back and cannot physically assist pt, wife drives.    OT Diagnosis: Generalized weakness;Cognitive deficits;Disturbance of vision   OT Problem List: Decreased activity tolerance;Impaired balance (sitting and/or standing);Decreased coordination;Decreased cognition;Decreased knowledge of use of DME or AE   OT Treatment/Interventions: Self-care/ADL training;Neuromuscular education;Therapeutic activities;Visual/perceptual remediation/compensation;Patient/family education;Balance training;DME and/or AE instruction    OT Goals(Current goals can be found in the care plan section) Acute Rehab OT Goals Patient Stated Goal: home OT Goal Formulation: With patient Time For Goal Achievement: 07/26/15 Potential to Achieve Goals: Good ADL Goals Pt Will Perform Eating: Independently;sitting Pt Will Perform Grooming: with supervision;standing Pt Will Perform Lower Body Bathing: with supervision;sit to/from stand Pt Will Perform Lower Body Dressing: with supervision;sit to/from stand Pt Will Transfer to Toilet: with  supervision;ambulating Pt Will Perform Toileting - Clothing Manipulation and hygiene: with supervision;sit to/from stand Pt Will Perform Tub/Shower Transfer: Tub transfer;with supervision;3 in 1;shower seat Additional ADL Goal #1: Pt will be progressive HEP to address vision deficits.  OT Frequency: Min 3X/week   Barriers to D/C:            Co-evaluation              End of Session Equipment Utilized During Treatment: Gait belt;Rolling walker  Activity Tolerance: Patient tolerated treatment well Patient left: in chair;with call bell/phone within reach;with chair alarm set;with family/visitor present   Time: [id number] OT Time Calculation (min): 38 min Charges:  OT General Charges $OT Visit: 1 Procedure OT Evaluation $Initial OT Evaluation Tier I: 1 Procedure OT Treatments $Self Care/Home Management : 8-22 mins G-Codes: OT G-codes **NOT FOR INPATIENT CLASS** Functional Assessment Tool Used: clinical judgement Functional Limitation: Self care Self Care Current Status [id number]): At least 20 percent but less than 40 percent impaired, limited or restricted Self Care Goal Status [id number]): At least 1 percent but less than 20 percent impaired, limited or restricted  Malka So 07/12/2015, 10:57 AM  [phone]

## 2015-07-12 NOTE — Care Management Note (Signed)
Case Management Note  Patient Details  Name: Ernest [name] MRN: [id number] Date of Birth: 09-02-1946  Subjective/Objective:                    Action/Plan: Patient was admitted with diplopia.  Current PT/OT recommendation is for CIR. Will follow for discharge needs pending disposition.  Expected Discharge Date:                  Expected Discharge Plan:     In-House Referral:     Discharge planning Services     Post Acute Care Choice:    Choice offered to:     DME Arranged:    DME Agency:     HH Arranged:    HH Agency:     Status of Service:  In process, will continue to follow  Medicare Important Message Given:    Date Medicare IM Given:    Medicare IM give by:    Date Additional Medicare IM Given:    Additional Medicare Important Message give by:     If discussed at [place] of Stay Meetings, dates discussed:    Additional Comments:  Rolm Baptise, RN 07/12/2015, 12:28 PM

## 2015-07-12 NOTE — Evaluation (Signed)
Speech Language Pathology Evaluation Patient Details Name: Ernest [name] MRN: [id number] DOB: Jul 27, 1946 Today's Date: 07/12/2015 Time: 0820-0840 SLP Time Calculation (min) (ACUTE ONLY): 20 min  Problem List:  Patient Active Problem List   Diagnosis Date Noted  . Stroke 07/11/2015  . Diplopia 07/11/2015  . Seizure 04/29/2015  . Bradycardia 04/29/2015  . Hyperglycemia 04/29/2015  . Simple partial seizure evolving to complex partial seizure evolving to generalized seizure 04/27/2015  . Pain in limb 12/15/2013  . End stage renal disease 09/29/2013  . Numbness 09/29/2013  . Obstructive sleep apnea 06/16/2013  . Hyperlipidemia   . ESRD (end stage renal disease)   . Erectile dysfunction   . Obesity   . Hypertension   . Hepatitis C   . COPD (chronic obstructive pulmonary disease)   . Coronary artery disease   . Ejection fraction < 50%   . Chronic combined systolic and diastolic CHF (congestive heart failure)   . Bacteremia   . Cardiomyopathy, ischemic 06/10/2013  . Uncontrolled type 2 diabetes mellitus with end-stage renal disease 06/08/2013  . Dyspnea 06/12/2011  . AODM 02/28/2009  . GOUT, UNSPECIFIED 02/28/2009  . EXUDATIVE RETINOPATHY 02/28/2009  . CKD (chronic kidney disease) stage V requiring chronic dialysis 02/28/2009   Past Medical History:  Past Medical History  Diagnosis Date  . Hyperlipidemia   . Retinopathy   . Gout   . ESRD (end stage renal disease)     Started HD in [place] in 2009, ESRD was due to DM. Moved to [facility] in Dec 2009 and now gets dialysis at [facility] on a MWF schedule.     . Type II or unspecified type diabetes mellitus without mention of complication, not stated as uncontrolled     adult onset  . Erectile dysfunction     penile implant  . Abnormal stress test     s/p cath November 2013 with modest disease involving the ostial left main, proximal LAD, proximal RCA - do not appear to be hemodynamically signficant; mild LV dysfunction  . Obesity    . Hypertension   . Hepatitis C   . COPD (chronic obstructive pulmonary disease)   . Coronary artery disease     per cath report 2013  . Ejection fraction < 50%   . Chronic systolic CHF (congestive heart failure)   . Bacteremia   . Shortness of breath     Hx: of with exertion  . Gout     Hx: of  . Stroke    Past Surgical History:  Past Surgical History  Procedure Laterality Date  . Penile prosthesis implant      1997.. no card  . Fistula      RUE and wrist  . Cardiac catheterization  August 26, 2012  . Eye surgery      laser.  and surgery for DM  . Insertion of dialysis catheter Right 01/01/2013    Procedure: INSERTION OF DIALYSIS CATHETER right  internal jugular;  Surgeon: Serafina Mitchell, MD;  Location: [place];  Service: Vascular;  Laterality: Right;  . Av fistula placement Left 01/13/2013    Procedure: INSERTION OF ARTERIOVENOUS (AV) GORE-TEX GRAFT ARM;  Surgeon: Angelia Mould, MD;  Location: [place];  Service: Vascular;  Laterality: Left;  . Thrombectomy w/ embolectomy Left 03/26/2013    Procedure: Attempted thrombectomy of left arm arteriovenous goretex graft.;  Surgeon: Angelia Mould, MD;  Location: [place];  Service: Vascular;  Laterality: Left;  . Insertion of dialysis catheter  N/A 03/26/2013    Procedure: INSERTION OF DIALYSIS CATHETER Left internal jugular vein;  Surgeon: Angelia Mould, MD;  Location: [place];  Service: Vascular;  Laterality: N/A;  . Avgg removal Left 03/26/2013    Procedure: REMOVAL OF  NON INCORPORATED ARTERIOVENOUS GORETEX GRAFT ([place]) left arm * repair of  left brachial artery with vein patch angioplasty.;  Surgeon: Angelia Mould, MD;  Location: [place];  Service: Vascular;  Laterality: Left;  . Av fistula placement Left 05/05/2013    Procedure: INSERTION OF LEFT UPPER ARM  ARTERIOVENOUS GORTEX GRAFT;  Surgeon: Angelia Mould, MD;  Location: [place];  Service: Vascular;  Laterality: Left;  . Tee without cardioversion N/A  06/11/2013    Procedure: TRANSESOPHAGEAL ECHOCARDIOGRAM (TEE);  Surgeon: Larey Dresser, MD;  Location: [place];  Service: Cardiovascular;  Laterality: N/A;  . Embolectomy Right 08/27/2013    Procedure: EMBOLECTOMY;  Surgeon: Mal Misty, MD;  Location: [place];  Service: Vascular;  Laterality: Right;  Thrombectomy of Radial and ulnar artery.  . Cataract extraction w/ intraocular lens  implant, bilateral    . Colonoscopy    . Ligation of arteriovenous  fistula Right 12/30/2013    Procedure: REMOVAL OF SEGMENT OF GORTEX GRAFT AND FISTULA  AND REPAIR OF BRACHIAL ARTERY;  Surgeon: Mal Misty, MD;  Location: [place];  Service: Vascular;  Laterality: Right;  Annell Greening N/A 04/07/2013    Procedure: VENOGRAM;  Surgeon: Serafina Mitchell, MD;  Location: [facility] CATH LAB;  Service: Cardiovascular;  Laterality: N/A;   HPI:  69 yo male with h/o cvas x3 adm with diplopia.  PMH + for HTN, CVAs, DM, smoker -quit 1974. .  MRI planned as CT negative.  CXR negative.     Assessment / Plan / Recommendation Clinical Impression  Pt demonstrates ongoing mixed dysarthria that he states is consistent with baseline from prior 3 CVAs.  Dysarthria marked by imprecise articulation, slow DDK rate resulting in decreased speech intelligibility at sentence/conversation level.  Pt tends to increase speech velocity over time.  He compensates well by repeating himself with subtle SLP to decreased understanding.  Basic problem solving skills intact for current environment - need to not get up on own due to fall risk.  Pt reports his wife is home full time and she manages bills, house management, medicines and apppointments.  He does report wife uses a wheelchair.   No SLP follow up indicated as pt reports being at baseline speech/cognitive level and he is functional with support level reported at home.    Thanks for this consult.      SLP Assessment  Patient does not need any further Speech Lanaguage Pathology Services    Follow  Up Recommendations  None    Frequency and Duration   n/a     Pertinent Vitals/Pain Pain Assessment: No/denies pain   SLP Goals  Patient/Family Stated Goal: to eat  SLP Evaluation Prior Functioning  Cognitive/Linguistic Baseline: Baseline deficits Baseline deficit details: speech and memory deficits from prior cvas Type of Home: House  Lives With: Spouse;Daughter (dtr 28 = pt is a little slow per pt) Available Help at Discharge: Family (wife is wheelchair bound- can drive) Education: 6th grade, pastor for 37 years, 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)   Cognition  Overall Cognitive Status: History of cognitive impairments - at baseline Arousal/Alertness: Awake/alert Orientation Level: Oriented X4 Memory: Impaired Memory Impairment: Storage deficit;Retrieval deficit;Decreased recall of new information (pt did  not recall having CT scan ) Awareness: Appears intact (aware to visual deficits) Problem Solving: Appears intact (for basic to call for assist and not try to get OOB on his own)    Comprehension  Auditory Comprehension Yes/No Questions: Not tested Commands: Within Functional Limits (for basic commands) Conversation: Complex (re: his medical care) Interfering Components: Attention;Visual impairments;Processing speed Reading Comprehension Reading Status: Impaired (exacerbated by pt's glasses being at home, pt reports using glasses for near and far - pt also states he does not read regardless) Word level:  (day of week - pt did not read correctly ) Sentence Level: Not tested Paragraph Level: Not tested Functional Environmental (signs, name badge): Not tested Interfering Components: Eye glasses not available    Expression Expression Primary Mode of Expression: Verbal Verbal Expression Overall Verbal Expression: Appears within functional limits for tasks assessed Initiation: No impairment Level of Generative/Spontaneous Verbalization:  Conversation Repetition: Impaired Level of Impairment: Sentence level Naming: Impairment Responsive: 26-50% accurate Confrontation: Impaired Other Naming Comments: pt named 1 of 3 animal pictures correctly- 2 extra with phonemic cue Pragmatics: No impairment Effective Techniques: Phonemic cues Non-Verbal Means of Communication: Not applicable Written Expression Dominant Hand: Right Written Expression: Not tested   Oral / Motor Oral Motor/Sensory Function Overall Oral Motor/Sensory Function: Other (comment) (left eye brow decreased movement, decreased lingual strength/coordination) Lingual Sensation: Reduced Facial Symmetry:  (decreased movement of left eyebrow) Motor Speech Overall Motor Speech: Impaired Respiration: Within functional limits Phonation: Normal Resonance: Within functional limits Articulation: Impaired Intelligibility: Intelligibility reduced Word: 75-100% accurate Phrase: 75-100% accurate Sentence: 50-74% accurate Conversation: 50-74% accurate Motor Planning: Witnin functional limits Motor Speech Errors: Not applicable Interfering Components: Premorbid status Effective Techniques: Slow rate   GO Functional Assessment Tool Used: clinical judgement Functional Limitations: Motor speech Motor Speech Current Status [phone]): At least 20 percent but less than 40 percent impaired, limited or restricted Motor Speech Goal Status [phone]): At least 20 percent but less than 40 percent impaired, limited or restricted Motor Speech Goal Status [phone]): At least 20 percent but less than 40 percent impaired, limited or restricted   Luanna Salk, [facility] SLP [phone]

## 2015-07-12 NOTE — Consult Note (Signed)
[facility] Renal Consultation Note    Indication for Consultation:  Management of ESRD/hemodialysis; anemia, hypertension/volume and secondary hyperparathyroidism PCP:  HPI: Ernest [name] is a 69 y.o. male with ESRD on hemodialysis M,W,F at [facility]. PMH significant for hypertension, DMT2, hyperlipidemia, CVA with residual dysarthria, Retinopathy, Gout, erectile dysfunction with penile implant, Hepatitis C, Chronic Systolic HF with EF [id number] in 2014, obesity, cardiac cath 2013 with nonobstructive disease, COPD.  Patient was in usual state of health when he awakened early in the morning on 07/11/2015 with diplopia creating difficulty with gait.  He presented to his hemodialysis session with persistent diplopia and was consequently sent to [facility] ED for evaluation. (Patient did stay for hemodialysis-received 3 hour 21 minutes of 4 hour 15 minute treatment). Patient was seen and evaluated by neurology upon arrival to [facility]. CT of head revealed no acute findings, Old lacunar infarct in the RIGHT cerebellar hemisphere. Patient was admitted for evaluation of Left Eye Horizontal Gaze Palsy & INO in setting of probable pontine CVA.   "They think I might have had another stroke". Patient says that his vision is now normal but left eye remains fixed. He says that nothing happened unusual prior to developing diplopia. "It just came on". Patient denies chest pain, SOB, NV abdominal pain, flank pain, cough, DOE, trauma, headache or seizure activity. Says that he was having trouble with walking to get to the [facility] bus for HD due to feeling like diplopia, that diplopia persisted throughout HD and into today. Does not recall when it went away today, "a little while ago".   Patient has hemodialysis MWF at [facility]. Patient does not miss treatments, stays for entire treatment. Last incenter lab values as follows: HGB 12.2 07/06/15 received venofer 50 mg IV weekly. Ca  9.1, Phos 3.9 06/29/15. Takes Ca Acetate 667mg  3 tablets 3 times a day with meals  and Renvela 800 mg 4 tablets three times a day with meals and 1 with snacks bid. (per home med list/[facility]) PTH 550 06/08/15 on Sensipar 60mg  po bid.     Past Medical History  Diagnosis Date  . Hyperlipidemia   . Retinopathy   . Gout   . ESRD (end stage renal disease)     Started HD in [place] in 2009, ESRD was due to DM. Moved to [facility] in Dec 2009 and now gets dialysis at [facility] on a MWF schedule.     . Type II or unspecified type diabetes mellitus without mention of complication, not stated as uncontrolled     adult onset  . Erectile dysfunction     penile implant  . Abnormal stress test     s/p cath November 2013 with modest disease involving the ostial left main, proximal LAD, proximal RCA - do not appear to be hemodynamically signficant; mild LV dysfunction  . Obesity   . Hypertension   . Hepatitis C   . COPD (chronic obstructive pulmonary disease)   . Coronary artery disease     per cath report 2013  . Ejection fraction < 50%   . Chronic systolic CHF (congestive heart failure)   . Bacteremia   . Shortness of breath     Hx: of with exertion  . Gout     Hx: of  . Stroke    Past Surgical History  Procedure Laterality Date  . Penile prosthesis implant      1997.. no card  . Fistula  RUE and wrist  . Cardiac catheterization  August 26, 2012  . Eye surgery      laser.  and surgery for DM  . Insertion of dialysis catheter Right 01/01/2013    Procedure: INSERTION OF DIALYSIS CATHETER right  internal jugular;  Surgeon: Serafina Mitchell, MD;  Location: [place];  Service: Vascular;  Laterality: Right;  . Av fistula placement Left 01/13/2013    Procedure: INSERTION OF ARTERIOVENOUS (AV) GORE-TEX GRAFT ARM;  Surgeon: Angelia Mould, MD;  Location: [place];  Service: Vascular;  Laterality: Left;  . Thrombectomy w/ embolectomy Left 03/26/2013    Procedure: Attempted thrombectomy of  left arm arteriovenous goretex graft.;  Surgeon: Angelia Mould, MD;  Location: [place];  Service: Vascular;  Laterality: Left;  . Insertion of dialysis catheter N/A 03/26/2013    Procedure: INSERTION OF DIALYSIS CATHETER Left internal jugular vein;  Surgeon: Angelia Mould, MD;  Location: [place];  Service: Vascular;  Laterality: N/A;  . Avgg removal Left 03/26/2013    Procedure: REMOVAL OF  NON INCORPORATED ARTERIOVENOUS GORETEX GRAFT ([place]) left arm * repair of  left brachial artery with vein patch angioplasty.;  Surgeon: Angelia Mould, MD;  Location: [place];  Service: Vascular;  Laterality: Left;  . Av fistula placement Left 05/05/2013    Procedure: INSERTION OF LEFT UPPER ARM  ARTERIOVENOUS GORTEX GRAFT;  Surgeon: Angelia Mould, MD;  Location: [place];  Service: Vascular;  Laterality: Left;  . Tee without cardioversion N/A 06/11/2013    Procedure: TRANSESOPHAGEAL ECHOCARDIOGRAM (TEE);  Surgeon: Larey Dresser, MD;  Location: [place];  Service: Cardiovascular;  Laterality: N/A;  . Embolectomy Right 08/27/2013    Procedure: EMBOLECTOMY;  Surgeon: Mal Misty, MD;  Location: [place];  Service: Vascular;  Laterality: Right;  Thrombectomy of Radial and ulnar artery.  . Cataract extraction w/ intraocular lens  implant, bilateral    . Colonoscopy    . Ligation of arteriovenous  fistula Right 12/30/2013    Procedure: REMOVAL OF SEGMENT OF GORTEX GRAFT AND FISTULA  AND REPAIR OF BRACHIAL ARTERY;  Surgeon: Mal Misty, MD;  Location: [place];  Service: Vascular;  Laterality: Right;  Annell Greening N/A 04/07/2013    Procedure: VENOGRAM;  Surgeon: Serafina Mitchell, MD;  Location: [facility] CATH LAB;  Service: Cardiovascular;  Laterality: N/A;   Family History  Problem Relation Age of Onset  . Heart disease Father   . Diabetes Sister   . Diabetes Brother   . Diabetes Son    Social History:  reports that he quit smoking about 42 years ago. He has never used smokeless tobacco. He reports  that he does not drink alcohol or use illicit drugs. [name] Prior to Admission medications   Medication Sig Start Date End Date Taking? Authorizing Provider  allopurinol (ZYLOPRIM) 100 MG tablet Take 100 mg by mouth daily.  12/13/14  Yes Historical Provider, MD  amitriptyline (ELAVIL) 25 MG tablet TAKE ONE TABLET BY MOUTH AT BEDTIME FOR  NEUROPATHY Patient taking differently: TAKE ONE TABLET BY MOUTH AT BEDTIME AS NEED  FOR  NEUROPATHY 03/18/14  Yes Bronson Ing, DPM  aspirin EC 81 MG tablet Take 81 mg by mouth daily. 06/18/13  Yes Carlena Bjornstad, MD  Calcium Acetate 667 MG TABS Take 2,001 tablets by mouth 3 (three) times daily with meals.    Yes Historical Provider, MD  cinacalcet (SENSIPAR) 60 MG tablet Take 60 mg by mouth 2 (two) times daily.  Yes Historical Provider, MD  clopidogrel (PLAVIX) 75 MG tablet Take 75 mg by mouth daily.    Yes Historical Provider, MD  colchicine 0.6 MG tablet Take 0.6 mg by mouth daily.  12/13/14  Yes Historical Provider, MD  insulin NPH Human (HUMULIN N,NOVOLIN N) 100 UNIT/ML injection Inject 0.1 mLs (10 Units total) into the skin 2 (two) times daily. 04/30/15  Yes Corky Sox, MD  levETIRAcetam (KEPPRA) 500 MG tablet Take 2 tablets (1,000 mg total) by mouth daily. Then take extra 500 mg (1 tablet) on MWF after HD 04/30/15  Yes Corky Sox, MD  lidocaine-prilocaine (EMLA) cream Apply 1 application topically as needed (dialysis).   Yes Historical Provider, MD  lisinopril (PRINIVIL,ZESTRIL) 20 MG tablet Take 20 mg by mouth daily.   Yes Historical Provider, MD  metoprolol (LOPRESSOR) 50 MG tablet Take 50 mg by mouth daily.  01/27/15  Yes Historical Provider, MD  multivitamin (RENA-VIT) TABS tablet Take 1 tablet by mouth daily.    Yes Historical Provider, MD  sevelamer (RENVELA) 800 MG tablet Take 800-3,200 mg by mouth 5 (five) times daily. 4 tablets three times daily with meals and 1 tablet twice daily with snacks.   Yes Historical Provider, MD   simvastatin (ZOCOR) 20 MG tablet Take 20 mg by mouth daily at 6 PM.    Yes Historical Provider, MD   Current Facility-Administered Medications  Medication Dose Route Frequency Provider Last Rate Last Dose  . allopurinol (ZYLOPRIM) tablet 100 mg  100 mg Oral Daily Marjan Rabbani, MD   100 mg at 07/12/15 1100  . amitriptyline (ELAVIL) tablet 25 mg  25 mg Oral QHS Marjan Rabbani, MD   25 mg at 07/11/15 2200  . aspirin EC tablet 81 mg  81 mg Oral Daily Marjan Rabbani, MD   81 mg at 07/12/15 1100  . aspirin suppository 300 mg  300 mg Rectal Once Marjan Rabbani, MD      . calcium acetate (PHOSLO) capsule 2,001 mg  2,001 mg Oral TID WC Axel Filler, MD   2,001 mg at 07/12/15 1102  . cinacalcet (SENSIPAR) tablet 60 mg  60 mg Oral BID WC Marjan Rabbani, MD   60 mg at 07/12/15 1101  . clopidogrel (PLAVIX) tablet 75 mg  75 mg Oral Daily Marjan Rabbani, MD   75 mg at 07/12/15 1101  . colchicine tablet 0.6 mg  0.6 mg Oral Daily Marjan Rabbani, MD   0.6 mg at 07/12/15 1100  . dextrose 50 % solution        50 mL at 07/12/15 0041  . dextrose 50 % solution           . heparin injection 5,000 Units  5,000 Units Subcutaneous 3 times per day Juluis Mire, MD   5,000 Units at 07/12/15 0609  . insulin aspart (novoLOG) injection 0-9 Units  0-9 Units Subcutaneous TID WC Marjan Rabbani, MD   0 Units at 07/12/15 1108  . insulin NPH Human (HUMULIN N,NOVOLIN N) injection 8 Units  8 Units Subcutaneous BID AC & HS Marjan Rabbani, MD   8 Units at 07/12/15 1059  . levETIRAcetam (KEPPRA) 1,000 mg in sodium chloride 0.9 % 100 mL IVPB  1,000 mg Intravenous Q24H Marjan Rabbani, MD   1,000 mg at 07/11/15 2216  . multivitamin (RENA-VIT) tablet 1 tablet  1 tablet Oral QHS Juluis Mire, MD   1 tablet at 07/11/15 2200  . senna-docusate (Senokot-S) tablet 1 tablet  1 tablet Oral QHS PRN Marjan Rabbani,  MD      . sevelamer carbonate (RENVELA) tablet 1,600 mg  1,600 mg Oral BID PRN Axel Filler, MD      . sevelamer  carbonate (RENVELA) tablet 3,200 mg  3,200 mg Oral TID WC Axel Filler, MD   3,200 mg at 07/12/15 1100  . simvastatin (ZOCOR) tablet 20 mg  20 mg Oral q1800 Juluis Mire, MD   20 mg at 07/11/15 2100   Labs: Basic Metabolic Panel:  Recent Labs Lab 07/11/15 1400 07/11/15 1402 07/12/15 0535  NA 142 140 139  K 3.2* 3.9 3.2*  CL 95* 93* 95*  CO2 37*  --  33*  GLUCOSE 100* 101* 115*  BUN 14 22* 18  CREATININE 5.63* 5.10* 7.49*  CALCIUM 8.6*  --  8.4*  PHOS  --   --  4.3   Liver Function Tests:  Recent Labs Lab 07/11/15 1400 07/12/15 0535  AST 19  --   ALT 16*  --   ALKPHOS 54  --   BILITOT 0.4  --   PROT 6.5  --   ALBUMIN 3.4* 3.2*   No results for input(s): LIPASE, AMYLASE in the last 168 hours. No results for input(s): AMMONIA in the last 168 hours. CBC:  Recent Labs Lab 07/11/15 1357 07/11/15 1402 07/12/15 0535  WBC 6.6  --  6.5  NEUTROABS 4.1  --   --   HGB 13.1 13.6 12.1*  HCT 38.5* 40.0 36.3*  MCV 85.0  --  86.2  PLT PLATELET CLUMPS NOTED ON SMEAR, COUNT APPEARS ADEQUATE  --  156   Cardiac Enzymes: No results for input(s): CKTOTAL, CKMB, CKMBINDEX, TROPONINI in the last 168 hours. CBG:  Recent Labs Lab 07/12/15 0040 07/12/15 0420 07/12/15 0447 07/12/15 0739 07/12/15 1129  GLUCAP 112* 52* 132* 73 163*   Iron Studies: No results for input(s): IRON, TIBC, TRANSFERRIN, FERRITIN in the last 72 hours. Studies/Results: Dg Chest 2 View  07/11/2015   CLINICAL DATA:  Left eye vision changes today. Hemodialysis patient. Initial encounter.  EXAM: CHEST  2 VIEW  COMPARISON:  04/29/2015 and 12/05/2014.  FINDINGS: The heart size and mediastinal contours are stable. Right axillary vascular stent noted. The overall basilar aeration has improved. There is no confluent airspace opacity, edema or significant pleural effusion. The bones appear unchanged.  IMPRESSION: No acute cardiopulmonary process.   Electronically Signed   By: Richardean Sale M.D.   On:  07/11/2015 16:57   Ct Head Wo Contrast  07/11/2015   CLINICAL DATA:  Intracranial hemorrhage. LEFT pupil nonreactive. Stroke with speech changes.  EXAM: CT HEAD WITHOUT CONTRAST  TECHNIQUE: Contiguous axial images were obtained from the base of the skull through the vertex without intravenous contrast.  COMPARISON:  04/25/2015.  FINDINGS: No mass lesion, mass effect, midline shift, hydrocephalus, hemorrhage. No acute territorial cortical ischemia/infarct. Atrophy and chronic ischemic white matter disease is present. Benign basal ganglia calcifications along with scattered parenchymal calcifications. These are chronic findings and appears similar to the prior exam. Old lacunar infarct in the RIGHT cerebellar hemisphere. Visible paranasal sinuses appear normal. Dense intracranial atherosclerosis.  IMPRESSION: Atrophy, chronic ischemic white matter disease and old lacunar infarcts without acute intracranial abnormality. No interval change from 04/25/2015.   Electronically Signed   By: Dereck Ligas M.D.   On: 07/11/2015 13:49    ROS: As per HPI otherwise negative.   Physical Exam: Filed Vitals:   07/12/15 0200 07/12/15 0400 07/12/15 0600 07/12/15 0935  BP: 161/65 165/54 153/57 142/57  Pulse: 54 61 56 64  Temp: 98.4 F (36.9 C) 98.4 F (36.9 C) 98.4 F (36.9 C) 99.6 F (37.6 C)  TempSrc: Oral Oral Oral Oral  Resp: 16 16 14 16   Height:      Weight:      SpO2: 93% 97% 97% 97%     General: Well developed, well nourished, in no acute distress. Head: Normocephalic, atraumatic, sclera non-icteric, mucus membranes are moist Neck: Supple. JVD not elevated. Lungs: Clear bilaterally to auscultation without wheezes, rales, or rhonchi. Breathing is unlabored. Heart: RRR with S1 S2. No murmurs, rubs, or gallops appreciated. Abdomen: Soft, non-tender, non-distended with normoactive bowel sounds. No rebound/guarding. No obvious abdominal masses. M-S:  Strength and tone appear normal for age. Lower  extremities:without edema or ischemic changes, no open wounds. Pulses intact.  Neuro: Alert and oriented X 3. Moves all extremities spontaneously. Tracts objects with R. Eye but left eye remains fixed. Dysarthria is present but not severe. Psych:  Responds to questions appropriately with a normal affect. Dialysis Access: LUA AVG + Thrill/+ Bruit.  Dialysis Orders: Center: [facility]   on MWF . EDW 99 kgs  HD Bath 2.0 K 2.0 Ca  Time 4 hours 15 minutes Heparin NO HEPARIN. Access LUA AVG BFR 450 DFR 800 manual    Venofer  50 mg IV weekly  Calcitriol 1.25 mcg PO MWF with HD  Assessment/Plan: 1.  Left Eye Horizontal Gaze Palsy & INO in setting of probable pontine CVA:  Per primary/Neurology following. No C/O of diplopia at present. 2.  ESRD -  MWF [place] Last HD 07/11/15. For HD tomorrow. Last K+3.2. Use 4.0 K bath and check renal profile prior to start of HD.  3.  Hypertension/volume  - Takes Metoprolol Tartrate 50 mg PO daily, Lisinopril 20 mg PO Q pm. SBP 165-142. BP meds not ordered per primary.  4.  Anemia  - Last HGB 12.1. Follow CBC. 5.  Metabolic bone disease -  Ca+ 8.4, Phos 4.3 Continue binders, calcitriol.  6.  Nutrition - Currently on carb mod diet. Needs Renal/Carb mod diet with fld restrictions. Albumin 3.2. Add nepro. Has renal vit. 7.  DM: Per primary.  Rita H. Owens Shark, NP-C 07/12/2015, 12:11 PM  [employer] [phone]  I have seen and examined this patient and agree with plan per Juanell Fairly.  69yo BM admitted with Sxs of pontine stroke.  Says diplopia is gone but Rt eye does not have conjugate gaze.  Note plans for CTA.  Will plan HD in AM.  Will follow BP but was on lisinopril and metoprolol PTA. MATTINGLY,MICHAEL T,MD 07/12/2015 3:08 PM

## 2015-07-13 ENCOUNTER — Observation Stay (HOSPITAL_COMMUNITY): Payer: Medicare Other

## 2015-07-13 ENCOUNTER — Inpatient Hospital Stay (HOSPITAL_COMMUNITY)
Admission: AD | Admit: 2015-07-13 | Discharge: 2015-07-21 | DRG: 056 | Disposition: A | Discharge status: Home Health | Payer: Medicare Other | Source: Intra-hospital | Attending: Physical Medicine & Rehabilitation | Admitting: Physical Medicine & Rehabilitation

## 2015-07-13 DIAGNOSIS — I120 Hypertensive chronic kidney disease with stage 5 chronic kidney disease or end stage renal disease: Secondary | ICD-10-CM | POA: Diagnosis not present

## 2015-07-13 DIAGNOSIS — N529 Male erectile dysfunction, unspecified: Secondary | ICD-10-CM | POA: Diagnosis present

## 2015-07-13 DIAGNOSIS — I69398 Other sequelae of cerebral infarction: Secondary | ICD-10-CM | POA: Diagnosis not present

## 2015-07-13 DIAGNOSIS — Z992 Dependence on renal dialysis: Secondary | ICD-10-CM

## 2015-07-13 DIAGNOSIS — J438 Other emphysema: Secondary | ICD-10-CM

## 2015-07-13 DIAGNOSIS — E1151 Type 2 diabetes mellitus with diabetic peripheral angiopathy without gangrene: Secondary | ICD-10-CM

## 2015-07-13 DIAGNOSIS — H510 Palsy (spasm) of conjugate gaze: Secondary | ICD-10-CM | POA: Diagnosis not present

## 2015-07-13 DIAGNOSIS — H532 Diplopia: Secondary | ICD-10-CM | POA: Diagnosis present

## 2015-07-13 DIAGNOSIS — H4922 Sixth [abducent] nerve palsy, left eye: Secondary | ICD-10-CM | POA: Diagnosis present

## 2015-07-13 DIAGNOSIS — M109 Gout, unspecified: Secondary | ICD-10-CM | POA: Diagnosis present

## 2015-07-13 DIAGNOSIS — Z955 Presence of coronary angioplasty implant and graft: Secondary | ICD-10-CM

## 2015-07-13 DIAGNOSIS — E1129 Type 2 diabetes mellitus with other diabetic kidney complication: Secondary | ICD-10-CM | POA: Diagnosis not present

## 2015-07-13 DIAGNOSIS — H5122 Internuclear ophthalmoplegia, left eye: Secondary | ICD-10-CM | POA: Diagnosis present

## 2015-07-13 DIAGNOSIS — G40909 Epilepsy, unspecified, not intractable, without status epilepticus: Secondary | ICD-10-CM | POA: Diagnosis present

## 2015-07-13 DIAGNOSIS — I132 Hypertensive heart and chronic kidney disease with heart failure and with stage 5 chronic kidney disease, or end stage renal disease: Secondary | ICD-10-CM | POA: Diagnosis present

## 2015-07-13 DIAGNOSIS — B1920 Unspecified viral hepatitis C without hepatic coma: Secondary | ICD-10-CM | POA: Diagnosis present

## 2015-07-13 DIAGNOSIS — I2510 Atherosclerotic heart disease of native coronary artery without angina pectoris: Secondary | ICD-10-CM | POA: Diagnosis present

## 2015-07-13 DIAGNOSIS — E1142 Type 2 diabetes mellitus with diabetic polyneuropathy: Secondary | ICD-10-CM | POA: Diagnosis present

## 2015-07-13 DIAGNOSIS — D649 Anemia, unspecified: Secondary | ICD-10-CM | POA: Diagnosis present

## 2015-07-13 DIAGNOSIS — Z87891 Personal history of nicotine dependence: Secondary | ICD-10-CM | POA: Diagnosis not present

## 2015-07-13 DIAGNOSIS — E785 Hyperlipidemia, unspecified: Secondary | ICD-10-CM | POA: Diagnosis present

## 2015-07-13 DIAGNOSIS — D631 Anemia in chronic kidney disease: Secondary | ICD-10-CM | POA: Diagnosis not present

## 2015-07-13 DIAGNOSIS — I6350 Cerebral infarction due to unspecified occlusion or stenosis of unspecified cerebral artery: Secondary | ICD-10-CM

## 2015-07-13 DIAGNOSIS — I5022 Chronic systolic (congestive) heart failure: Secondary | ICD-10-CM | POA: Diagnosis present

## 2015-07-13 DIAGNOSIS — N2581 Secondary hyperparathyroidism of renal origin: Secondary | ICD-10-CM | POA: Diagnosis present

## 2015-07-13 DIAGNOSIS — Z794 Long term (current) use of insulin: Secondary | ICD-10-CM | POA: Diagnosis not present

## 2015-07-13 DIAGNOSIS — E669 Obesity, unspecified: Secondary | ICD-10-CM | POA: Diagnosis present

## 2015-07-13 DIAGNOSIS — E1122 Type 2 diabetes mellitus with diabetic chronic kidney disease: Secondary | ICD-10-CM | POA: Diagnosis present

## 2015-07-13 DIAGNOSIS — K5900 Constipation, unspecified: Secondary | ICD-10-CM | POA: Diagnosis present

## 2015-07-13 DIAGNOSIS — N186 End stage renal disease: Secondary | ICD-10-CM | POA: Diagnosis not present

## 2015-07-13 DIAGNOSIS — I69328 Other speech and language deficits following cerebral infarction: Secondary | ICD-10-CM

## 2015-07-13 DIAGNOSIS — I10 Essential (primary) hypertension: Secondary | ICD-10-CM | POA: Diagnosis not present

## 2015-07-13 DIAGNOSIS — J449 Chronic obstructive pulmonary disease, unspecified: Secondary | ICD-10-CM | POA: Diagnosis present

## 2015-07-13 DIAGNOSIS — E11319 Type 2 diabetes mellitus with unspecified diabetic retinopathy without macular edema: Secondary | ICD-10-CM | POA: Diagnosis present

## 2015-07-13 DIAGNOSIS — I69322 Dysarthria following cerebral infarction: Secondary | ICD-10-CM | POA: Diagnosis not present

## 2015-07-13 DIAGNOSIS — I639 Cerebral infarction, unspecified: Secondary | ICD-10-CM | POA: Diagnosis not present

## 2015-07-13 LAB — RENAL FUNCTION PANEL
Albumin: 3 g/dL — ABNORMAL LOW (ref 3.5–5.0)
Albumin: 3.2 g/dL — ABNORMAL LOW (ref 3.5–5.0)
Anion gap: 10 (ref 5–15)
Anion gap: 7 (ref 5–15)
BUN: 34 mg/dL — AB (ref 6–20)
BUN: 16 mg/dL (ref 6–20)
CALCIUM: 8.8 mg/dL — AB (ref 8.9–10.3)
CHLORIDE: 92 mmol/L — AB (ref 101–111)
CO2: 34 mmol/L — AB (ref 22–32)
CO2: 33 mmol/L — ABNORMAL HIGH (ref 22–32)
Calcium: 8.6 mg/dL — ABNORMAL LOW (ref 8.9–10.3)
Chloride: 94 mmol/L — ABNORMAL LOW (ref 101–111)
Creatinine, Ser: 9.8 mg/dL — ABNORMAL HIGH (ref 0.61–1.24)
Creatinine, Ser: 5.88 mg/dL — ABNORMAL HIGH (ref 0.61–1.24)
GFR calc Af Amer: 6 mL/min — ABNORMAL LOW (ref >60)
GFR calc Af Amer: 10 mL/min — ABNORMAL LOW (ref >60)
GFR calc non Af Amer: 5 mL/min — ABNORMAL LOW (ref >60)
GFR calc non Af Amer: 9 mL/min — ABNORMAL LOW (ref >60)
GLUCOSE: 82 mg/dL (ref 65–99)
Glucose, Bld: 181 mg/dL — ABNORMAL HIGH (ref 65–99)
PHOSPHORUS: 4.9 mg/dL — AB (ref 2.5–4.6)
POTASSIUM: 4 mmol/L (ref 3.5–5.1)
Phosphorus: 2.1 mg/dL — ABNORMAL LOW (ref 2.5–4.6)
Potassium: 4.1 mmol/L (ref 3.5–5.1)
Sodium: 136 mmol/L (ref 135–145)
Sodium: 134 mmol/L — ABNORMAL LOW (ref 135–145)

## 2015-07-13 LAB — CBC
HEMATOCRIT: 35.1 % — AB (ref 39.0–52.0)
HEMATOCRIT: 36.2 % — AB (ref 39.0–52.0)
Hemoglobin: 11.7 g/dL — ABNORMAL LOW (ref 13.0–17.0)
Hemoglobin: 12 g/dL — ABNORMAL LOW (ref 13.0–17.0)
MCH: 28.4 pg (ref 26.0–34.0)
MCH: 28.4 pg (ref 26.0–34.0)
MCHC: 33.3 g/dL (ref 30.0–36.0)
MCHC: 33.1 g/dL (ref 30.0–36.0)
MCV: 85.2 fL (ref 78.0–100.0)
MCV: 85.8 fL (ref 78.0–100.0)
PLATELETS: 162 10*3/uL (ref 150–400)
Platelets: 162 10*3/uL (ref 150–400)
RBC: 4.12 MIL/uL — ABNORMAL LOW (ref 4.22–5.81)
RBC: 4.22 MIL/uL (ref 4.22–5.81)
RDW: 14.6 % (ref 11.5–15.5)
RDW: 14.9 % (ref 11.5–15.5)
WBC: 6.5 10*3/uL (ref 4.0–10.5)
WBC: 7.2 10*3/uL (ref 4.0–10.5)

## 2015-07-13 LAB — MAGNESIUM: MAGNESIUM: 2.6 mg/dL — AB (ref 1.7–2.4)

## 2015-07-13 LAB — GLUCOSE, CAPILLARY
GLUCOSE-CAPILLARY: 72 mg/dL (ref 65–99)
GLUCOSE-CAPILLARY: 191 mg/dL — AB (ref 65–99)
GLUCOSE-CAPILLARY: 167 mg/dL — AB (ref 65–99)
Glucose-Capillary: 146 mg/dL — ABNORMAL HIGH (ref 65–99)
Glucose-Capillary: 82 mg/dL (ref 65–99)

## 2015-07-13 LAB — HEPATITIS C ANTIBODY: HCV Ab: <0.1 s/co ratio (ref 0.0–0.9)

## 2015-07-13 LAB — HEMOGLOBIN A1C
Hgb A1c MFr Bld: 7.7 % — ABNORMAL HIGH (ref 4.8–5.6)
MEAN PLASMA GLUCOSE: 174 mg/dL

## 2015-07-13 MED ORDER — CHLORHEXIDINE GLUCONATE 0.12 % MT SOLN
15.0000 mL | Freq: Two times a day (BID) | OROMUCOSAL | Status: DC
  Administered 2015-07-14 – 2015-07-21 (×12): 15 mL via OROMUCOSAL
  Filled 2015-07-13 (×11): qty 15

## 2015-07-13 MED ORDER — NEPRO/CARBSTEADY PO LIQD
237.0000 mL | Freq: Two times a day (BID) | ORAL | Status: DC
  Administered 2015-07-14 – 2015-07-21 (×14): 237 mL via ORAL

## 2015-07-13 MED ORDER — INSULIN NPH (HUMAN) (ISOPHANE) 100 UNIT/ML ~~LOC~~ SUSP
8.0000 [IU] | Freq: Two times a day (BID) | SUBCUTANEOUS | Status: DC
  Administered 2015-07-13 – 2015-07-17 (×8): 8 [IU] via SUBCUTANEOUS
  Filled 2015-07-13: qty 10

## 2015-07-13 MED ORDER — ASPIRIN EC 81 MG PO TBEC
81.0000 mg | DELAYED_RELEASE_TABLET | Freq: Every day | ORAL | Status: DC
  Administered 2015-07-14 – 2015-07-21 (×6): 81 mg via ORAL
  Filled 2015-07-13 (×8): qty 1

## 2015-07-13 MED ORDER — LEVETIRACETAM 500 MG PO TABS
1000.0000 mg | ORAL_TABLET | Freq: Every day | ORAL | Status: DC
  Administered 2015-07-14 – 2015-07-21 (×8): 1000 mg via ORAL
  Filled 2015-07-13 (×8): qty 2

## 2015-07-13 MED ORDER — LIDOCAINE-PRILOCAINE 2.5-2.5 % EX CREA
1.0000 "application " | TOPICAL_CREAM | CUTANEOUS | Status: DC | PRN
  Filled 2015-07-13: qty 5

## 2015-07-13 MED ORDER — COLCHICINE 0.6 MG PO TABS
0.6000 mg | ORAL_TABLET | Freq: Every day | ORAL | Status: DC
  Administered 2015-07-14 – 2015-07-19 (×5): 0.6 mg via ORAL
  Filled 2015-07-13 (×7): qty 1

## 2015-07-13 MED ORDER — AMITRIPTYLINE HCL 25 MG PO TABS
25.0000 mg | ORAL_TABLET | Freq: Every day | ORAL | Status: DC
  Administered 2015-07-13 – 2015-07-21 (×8): 25 mg via ORAL
  Filled 2015-07-13 (×10): qty 1

## 2015-07-13 MED ORDER — SODIUM CHLORIDE 0.9 % IV SOLN: 100.0000 mL | INTRAVENOUS | Status: DC | PRN

## 2015-07-13 MED ORDER — HEPARIN SODIUM (PORCINE) 1000 UNIT/ML DIALYSIS
20.0000 [IU]/kg | INTRAMUSCULAR | Status: DC | PRN
  Filled 2015-07-13: qty 3

## 2015-07-13 MED ORDER — HEPARIN SODIUM (PORCINE) 1000 UNIT/ML DIALYSIS: 1000.0000 [IU] | INTRAMUSCULAR | Status: DC | PRN

## 2015-07-13 MED ORDER — HEPARIN SODIUM (PORCINE) 5000 UNIT/ML IJ SOLN: 5000.0000 [IU] | Freq: Three times a day (TID) | INTRAMUSCULAR | Status: DC

## 2015-07-13 MED ORDER — SORBITOL 70 % SOLN
30.0000 mL | Freq: Every day | Status: DC | PRN
  Administered 2015-07-14: 30 mL via ORAL
  Filled 2015-07-13: qty 30

## 2015-07-13 MED ORDER — ALLOPURINOL 100 MG PO TABS
100.0000 mg | ORAL_TABLET | Freq: Every day | ORAL | Status: DC
  Administered 2015-07-14 – 2015-07-21 (×6): 100 mg via ORAL
  Filled 2015-07-13 (×8): qty 1

## 2015-07-13 MED ORDER — ALTEPLASE 2 MG IJ SOLR: 2.0000 mg | Freq: Once | INTRAMUSCULAR | Status: DC | PRN

## 2015-07-13 MED ORDER — RENA-VITE PO TABS
1.0000 | ORAL_TABLET | Freq: Every day | ORAL | Status: DC
  Administered 2015-07-13 – 2015-07-21 (×8): 1 via ORAL
  Filled 2015-07-13 (×9): qty 1

## 2015-07-13 MED ORDER — LIDOCAINE HCL (PF) 1 % IJ SOLN: 5.0000 mL | INTRAMUSCULAR | Status: DC | PRN

## 2015-07-13 MED ORDER — ONDANSETRON HCL 4 MG PO TABS: 4.0000 mg | ORAL_TABLET | Freq: Four times a day (QID) | ORAL | Status: DC | PRN

## 2015-07-13 MED ORDER — LEVETIRACETAM 500 MG PO TABS
500.0000 mg | ORAL_TABLET | ORAL | Status: DC
  Administered 2015-07-13 – 2015-07-21 (×4): 500 mg via ORAL
  Filled 2015-07-13 (×4): qty 1

## 2015-07-13 MED ORDER — ONDANSETRON HCL 4 MG/2ML IJ SOLN: 4.0000 mg | Freq: Four times a day (QID) | INTRAMUSCULAR | Status: DC | PRN

## 2015-07-13 MED ORDER — PENTAFLUOROPROP-TETRAFLUOROETH EX AERO: 1.0000 "application " | INHALATION_SPRAY | CUTANEOUS | Status: DC | PRN

## 2015-07-13 MED ORDER — INSULIN ASPART 100 UNIT/ML ~~LOC~~ SOLN
0.0000 [IU] | Freq: Three times a day (TID) | SUBCUTANEOUS | Status: DC
  Administered 2015-07-14 – 2015-07-16 (×4): 1 [IU] via SUBCUTANEOUS
  Administered 2015-07-16: 2 [IU] via SUBCUTANEOUS
  Administered 2015-07-17 – 2015-07-19 (×6): 1 [IU] via SUBCUTANEOUS
  Administered 2015-07-19: 2 [IU] via SUBCUTANEOUS
  Administered 2015-07-20: 1 [IU] via SUBCUTANEOUS

## 2015-07-13 MED ORDER — SIMVASTATIN 20 MG PO TABS
20.0000 mg | ORAL_TABLET | Freq: Every day | ORAL | Status: DC
  Administered 2015-07-13 – 2015-07-19 (×7): 20 mg via ORAL
  Filled 2015-07-13 (×8): qty 1

## 2015-07-13 MED ORDER — CETYLPYRIDINIUM CHLORIDE 0.05 % MT LIQD
7.0000 mL | Freq: Two times a day (BID) | OROMUCOSAL | Status: DC
  Administered 2015-07-14 – 2015-07-19 (×8): 7 mL via OROMUCOSAL

## 2015-07-13 MED ORDER — CLOPIDOGREL BISULFATE 75 MG PO TABS
75.0000 mg | ORAL_TABLET | Freq: Every day | ORAL | Status: DC
  Administered 2015-07-14 – 2015-07-21 (×6): 75 mg via ORAL
  Filled 2015-07-13 (×8): qty 1

## 2015-07-13 MED ORDER — SEVELAMER CARBONATE 800 MG PO TABS
3200.0000 mg | ORAL_TABLET | Freq: Three times a day (TID) | ORAL | Status: DC
  Administered 2015-07-13 – 2015-07-14 (×2): 3200 mg via ORAL
  Filled 2015-07-13 (×2): qty 4

## 2015-07-13 MED ORDER — NEPRO/CARBSTEADY PO LIQD: 237.0000 mL | ORAL | Status: DC | PRN

## 2015-07-13 MED ORDER — CINACALCET HCL 30 MG PO TABS
60.0000 mg | ORAL_TABLET | Freq: Two times a day (BID) | ORAL | Status: DC
  Administered 2015-07-13 – 2015-07-21 (×16): 60 mg via ORAL
  Filled 2015-07-13 (×17): qty 2

## 2015-07-13 MED ORDER — ALTEPLASE 2 MG IJ SOLR
2.0000 mg | Freq: Once | INTRAMUSCULAR | Status: DC | PRN
  Filled 2015-07-13: qty 2

## 2015-07-13 MED ORDER — SEVELAMER CARBONATE 800 MG PO TABS: 800.0000 mg | ORAL_TABLET | Freq: Every day | ORAL | Status: DC

## 2015-07-13 MED ORDER — LIDOCAINE-PRILOCAINE 2.5-2.5 % EX CREA: 1.0000 "application " | TOPICAL_CREAM | CUTANEOUS | Status: DC | PRN

## 2015-07-13 MED ORDER — HEPARIN SODIUM (PORCINE) 5000 UNIT/ML IJ SOLN
5000.0000 [IU] | Freq: Three times a day (TID) | INTRAMUSCULAR | Status: DC
  Administered 2015-07-13 – 2015-07-21 (×19): 5000 [IU] via SUBCUTANEOUS
  Filled 2015-07-13 (×20): qty 1

## 2015-07-13 MED ORDER — CALCIUM ACETATE (PHOS BINDER) 667 MG PO CAPS
2001.0000 mg | ORAL_CAPSULE | Freq: Three times a day (TID) | ORAL | Status: DC
  Administered 2015-07-13 – 2015-07-14 (×2): 2001 mg via ORAL
  Filled 2015-07-13 (×2): qty 3

## 2015-07-13 MED ORDER — SENNOSIDES-DOCUSATE SODIUM 8.6-50 MG PO TABS: 1.0000 | ORAL_TABLET | Freq: Every evening | ORAL | Status: DC | PRN

## 2015-07-13 MED ORDER — HEPARIN SODIUM (PORCINE) 1000 UNIT/ML DIALYSIS
1000.0000 [IU] | INTRAMUSCULAR | Status: DC | PRN
  Filled 2015-07-13: qty 1

## 2015-07-13 NOTE — Progress Notes (Signed)
Patient admitted to the unit from [facility]. Patient alert and oriented x 4. Patient made comfortable. MD paged to notify that patient is on the unit. Will continue to monitor.

## 2015-07-13 NOTE — Progress Notes (Signed)
Gerlean Ren Rehab Admission Coordinator Signed Physical Medicine and Rehabilitation PMR Pre-admission 07/13/2015 11:58 AM  Related encounter: ED to Hosp-Admission (Current) from 07/11/2015 in [facility]   PMR Admission Coordinator Pre-Admission Assessment  Patient: Ernest [name] is an 69 y.o., male MRN: [id number] DOB: Dec 27, 1945 Height: 6' (182.9 cm) Weight: 95.8 kg (211 lb 3.2 oz)  Insurance Information HMO: PPO: PCP: IPA: 80/20: OTHER:  PRIMARY: Medicare A and B Policy#: [id number] Subscriber: self CM Name: Phone#: Fax#:  Pre-Cert#: Employer: disability Benefits: Phone #: Name:  Eff. Date: 05/15/08 Deduct: $1288 Out of Pocket Max: none Life Max: none CIR: 100% SNF: 100% first 20 days Outpatient: 80% Co-Pay: 20% Home Health: 100% Co-Pay:  DME: 80% Co-Pay: 20% Providers: Pt. choice SECONDARY: BCBS Policy#: [id number] Subscriber: self CM Name: Phone#: Fax#:  Pre-Cert#: Employer:  Benefits: Phone #: Name:  Eff. Date: Deduct: Out of Pocket Max: Life Max:  CIR: SNF:  Outpatient: Co-Pay:  Home Health: Co-Pay:  DME: Co-Pay:   Medicaid Application Date: Case Manager:  Disability Application Date: Case Worker:   Emergency Contact Information Contact Information    Name Relation Home Work Bath Spouse [phone]     Alean Rinne Daughter        Current Medical History  Patient Admitting Diagnosis: Pontine infarct with diplopia and balance deficits. History of Present Illness: Ernest [name] is a 69 y.o. right handed  male with history of end-stage renal disease with hemodialysis Monday Wednesday Friday at [facility], hypertension, seizure disorder maintained on Keppra, diabetes mellitus peripheral neuropathy, CAD with stenting and CVA with little residual weakness . Patient had been on aspirin and Plavix but ran out of aspirin was taking only Plavix. Presented 07/11/2015 with diplopia and slurred speech. Lives with spouse independent with single-point cane prior to admission. Wife is physically limited to provide assistance. Cranial CT scan with chronic ischemic white matter disease old lacunar infarct without acute intracranial abnormality. Echocardiogram with ejection fraction of [id number] grade 1 diastolic dysfunction. Carotid Dopplers with no ICA stenosis. MRI of the brain not completed due to history of penile implant. CT angiogram head and neck are pending. Neurology consulted presently remains on aspirin and Plavix. Subcutaneous heparin for DVT prophylaxis. Regular consistency diet. Physical and occupational therapy evaluation completed with recommendations of physical medicine rehabilitation consult. Patient was admitted for a comprehensive rehabilitation program Total: 1 NIH    Past Medical History  Past Medical History  Diagnosis Date  . Hyperlipidemia   . Retinopathy   . Gout   . ESRD (end stage renal disease)     Started HD in [place] in 2009, ESRD was due to DM. Moved to [facility] in Dec 2009 and now gets dialysis at [facility] on a MWF schedule.   . Erectile dysfunction     penile implant  . Abnormal stress test     s/p cath November 2013 with modest disease involving the ostial left main, proximal LAD, proximal RCA - do not appear to be hemodynamically signficant; mild LV dysfunction  . Obesity   . Hypertension   . Hepatitis C   . COPD (chronic obstructive pulmonary disease)   . Coronary artery disease     per cath report 2013  . Ejection  fraction < 50%   . Chronic systolic CHF (congestive heart failure)   . Bacteremia   . Shortness of breath     Hx:  of with exertion  . Gout     Hx: of  . Stroke   . Type II or unspecified type diabetes mellitus without mention of complication, not stated as uncontrolled     adult onset    Family History  family history includes Diabetes in his brother, sister, and son; Heart disease in his father.  Prior Rehab/Hospitalizations:  Has the patient had major surgery during 100 days prior to admission? No  Current Medications   Current facility-administered medications:  . allopurinol (ZYLOPRIM) tablet 100 mg, 100 mg, Oral, Daily, Marjan Rabbani, MD, 100 mg at 07/13/15 1333 . amitriptyline (ELAVIL) tablet 25 mg, 25 mg, Oral, QHS, Marjan Rabbani, MD, 25 mg at 07/12/15 2149 . antiseptic oral rinse (CPC / CETYLPYRIDINIUM CHLORIDE 0.05%) solution 7 mL, 7 mL, Mouth Rinse, q12n4p, Axel Filler, MD, 7 mL at 07/13/15 1336 . aspirin EC tablet 81 mg, 81 mg, Oral, Daily, Marjan Rabbani, MD, 81 mg at 07/13/15 1333 . calcium acetate (PHOSLO) capsule 2,001 mg, 2,001 mg, Oral, TID WC, Axel Filler, MD, 2,001 mg at 07/13/15 1342 . chlorhexidine (PERIDEX) 0.12 % solution 15 mL, 15 mL, Mouth Rinse, BID, Axel Filler, MD, 15 mL at 07/12/15 2148 . cinacalcet (SENSIPAR) tablet 60 mg, 60 mg, Oral, BID WC, Marjan Rabbani, MD, 60 mg at 07/12/15 1821 . clopidogrel (PLAVIX) tablet 75 mg, 75 mg, Oral, Daily, Marjan Rabbani, MD, 75 mg at 07/13/15 1333 . colchicine tablet 0.6 mg, 0.6 mg, Oral, Daily, Marjan Rabbani, MD, 0.6 mg at 07/13/15 1333 . feeding supplement (NEPRO CARB STEADY) liquid 237 mL, 237 mL, Oral, BID BM, Valentina Gu, NP, 237 mL at 07/12/15 1517 . heparin injection 5,000 Units, 5,000 Units, Subcutaneous, 3 times per day, Juluis Mire, MD, 5,000 Units at 07/13/15 1334 . insulin aspart (novoLOG) injection 0-9 Units, 0-9 Units,  Subcutaneous, TID WC, Marjan Rabbani, MD, 1 Units at 07/12/15 1402 . insulin NPH Human (HUMULIN N,NOVOLIN N) injection 8 Units, 8 Units, Subcutaneous, BID AC & HS, Marjan Rabbani, MD, 8 Units at 07/12/15 2156 . levETIRAcetam (KEPPRA) tablet 1,000 mg, 1,000 mg, Oral, Daily, Kris Mouton, RPH, 1,000 mg at 07/13/15 1334 . levETIRAcetam (KEPPRA) tablet 500 mg, 500 mg, Oral, Q M,W,F-2000, Kris Mouton, RPH . multivitamin (RENA-VIT) tablet 1 tablet, 1 tablet, Oral, QHS, Marjan Rabbani, MD, 1 tablet at 07/12/15 2149 . senna-docusate (Senokot-S) tablet 1 tablet, 1 tablet, Oral, QHS PRN, Marjan Rabbani, MD . sevelamer carbonate (RENVELA) tablet 3,200 mg, 3,200 mg, Oral, TID WC, Axel Filler, MD, 3,200 mg at 07/13/15 1333 . sevelamer carbonate (RENVELA) tablet 800 mg, 800 mg, Oral, Q1500, Valentina Gu, NP, Stopped at 07/12/15 1819 . simvastatin (ZOCOR) tablet 20 mg, 20 mg, Oral, q1800, Marjan Rabbani, MD, 20 mg at 07/12/15 1821  Patients Current Diet: Diet renal/carb modified with fluid restriction Diet-HS Snack?: Nothing; Room service appropriate?: Yes; Fluid consistency:: Thin  Precautions / Restrictions Precautions Precautions: Fall Precaution Comments: vision deficits Restrictions Weight Bearing Restrictions: No   Has the patient had 2 or more falls or a fall with injury in the past year?No; wife reports pt. Had a fall a year ago when his blood sugar dropped and he passed out, injured his right leg  Prior Activity Level Community (5-7x/wk): Pt. reportss he is out of the home most days. He goes to HD on MWF via [facility], and goes out for errands with wife on other days. Wife is "in a wheelchair" however drives.  Home Assistive Devices / Equipment  Home Assistive Devices/Equipment: CBG Meter Home Equipment: Walker - 2 wheels, Cane - single point, Shower seat  Prior Device Use: Indicate devices/aids used by the patient prior to current illness, exacerbation or injury? cane  used on PRN basis  Prior Functional Level Prior Function Level of Independence: Independent Comments: pt was bathing, dressing, ambulating without AD. Wife has a bad back and cannot physically assist pt, wife drives.  Self Care: Did the patient need help bathing, dressing, using the toilet or eating? Independent  Indoor Mobility: Did the patient need assistance with walking from room to room (with or without device)? Independent  Stairs: Did the patient need assistance with internal or external stairs (with or without device)? Independent  Functional Cognition: Did the patient need help planning regular tasks such as shopping or remembering to take medications? Independent  Current Functional Level Cognition  Arousal/Alertness: Awake/alert Overall Cognitive Status: History of cognitive impairments - at baseline (6th grade education, wife states he is at baseline) Orientation Level: Oriented X4 Memory: Impaired Memory Impairment: Storage deficit, Retrieval deficit, Decreased recall of new information (pt did not recall having CT scan ) Awareness: Appears intact (aware to visual deficits) Problem Solving: Appears intact (for basic to call for assist and not try to get OOB on his own)   Extremity Assessment (includes Sensation/Coordination)  Upper Extremity Assessment: RUE deficits/detail, LUE deficits/detail RUE Deficits / Details: 4/5 strength RUE Coordination: decreased gross motor LUE Deficits / Details: 4/5 strength, dialysis graft LUE Coordination: decreased gross motor  Lower Extremity Assessment: Overall WFL for tasks assessed    ADLs  Overall ADL's : Needs assistance/impaired Eating/Feeding Details (indicate cue type and reason): awaiting meal tray Grooming: Wash/dry hands, Wash/dry face, Standing, Minimal assistance Grooming Details (indicate cue type and reason): cues for locating paper towels, soap  Upper Body Bathing: Minimal assitance, Sitting Upper Body  Bathing Details (indicate cue type and reason): assisted with back Lower Body Bathing: Minimal assistance, Sit to/from stand Upper Body Dressing : Minimal assistance, Sitting Upper Body Dressing Details (indicate cue type and reason): assist to orient Lower Body Dressing: Minimal assistance, Sit to/from stand Lower Body Dressing Details (indicate cue type and reason): able to don and doff socks, steadying assist needed to pull up pants Toilet Transfer: Minimal assistance, RW, Ambulation, Regular Toilet Toileting- Clothing Manipulation and Hygiene: Minimal assistance, Sit to/from stand Functional mobility during ADLs: Minimal assistance, Rolling walker    Mobility  Overal bed mobility: Needs Assistance Bed Mobility: Supine to Sit Supine to sit: Supervision General bed mobility comments: pt in chair    Transfers  Overall transfer level: Needs assistance Equipment used: Rolling walker (2 wheeled) Transfers: Sit to/from Stand Sit to Stand: Min assist General transfer comment: posterior bias initially, min assist for balance, verbal cues for hand placement    Ambulation / Gait / Stairs / Wheelchair Mobility  Ambulation/Gait Ambulation/Gait assistance: Min assist, Mod assist Ambulation Distance (Feet): 120 Feet Assistive device: Rolling walker (2 wheeled), None Gait Pattern/deviations: Step-through pattern, Wide base of support General Gait Details: attempted to ambulate without AD however pt required modA to maintain balance and was reaching for hallway rail. pt with noted difficulty with spatial awareness, likely due to vision deficits. once given RW pt with increased stabilty however still required minA during turning for safety Gait velocity: slow    Posture / Balance Balance Overall balance assessment: Needs assistance Sitting-balance support: Feet supported Sitting balance-Leahy Scale: Good Standing balance support: Bilateral upper extremity supported Standing  balance-Leahy Scale: Poor Standing  balance comment: leans against sink with grooming    Special needs/care consideration BiPAP/CPAP no CPM no Continuous Drip IV no Dialysis no  Life Vest no Oxygen no Special Bed no Trach Size no Wound Vac (area) no  Skin Per chart, skin tear left ankle  Bowel mgmt: 07/10/15 last BM Bladder mgmt:  Diabetic mgmt yes     Previous Home Environment Living Arrangements: Spouse/significant other Lives With: Spouse, Daughter Available Help at Discharge: Family Type of Home: House Home Layout: Two level, 1/2 bath on main level Alternate Level Stairs-Rails: Left Alternate Level Stairs-Number of Steps: 14 Home Access: Stairs to enter [employer] of Steps: 1 Bathroom Shower/Tub: Tub/shower unit (garden tub) Biochemist, clinical: [place]: No  Discharge Living Setting Plans for Discharge Living Setting: Patient's home Type of Home at Discharge: House Discharge Home Layout: Two level, 1/2 bath on main level Alternate Level Stairs-Rails: Left Alternate Level Stairs-Number of Steps: 14 Discharge Home Access: Stairs to enter Entrance Stairs-Number of Steps: 1 Discharge Bathroom Shower/Tub: Tub/shower unit (garden tub) Discharge Bathroom Toilet: Standard Discharge Bathroom Accessibility: Yes How Accessible: Accessible via walker Does the patient have any problems obtaining your medications?: No  Social/Family/Support Systems Patient Roles: Spouse, Parent Anticipated Caregiver: wife, Carrie Readus can provide supervision. Wife drives but spends much of time in a wheelchair Anticipated Caregiver's Contact Information: wife, [phone] (home) Ability/Limitations of Caregiver: wife spends much of time in w/c but can drive. She cannot provide physical assist for pt.  Caregiver Availability: 24/7 Discharge Plan Discussed with Primary Caregiver: Yes Is  Caregiver In Agreement with Plan?: Yes Does Caregiver/Family have Issues with Lodging/Transportation while Pt is in Rehab?: No   Goals/Additional Needs Patient/Family Goal for Rehab: modified independent and supervision PT/OT; modified independent SLP Expected length of stay: 8-12 days Cultural Considerations: none Dietary Needs: renal, carb modified with fluid restrictions Equipment Needs: TBA Pt/Family Agrees to Admission and willing to participate: Yes Program Orientation Provided & Reviewed with Pt/Caregiver Including Roles & Responsibilities: Yes   Decrease burden of Care through IP rehab admission: no   Possible need for SNF placement upon discharge: not anticipated   Patient Condition: This patient's condition remains as documented in the consult dated 07/12/15, in which the Rehabilitation Physician determined and documented that the patient's condition is appropriate for intensive rehabilitative care in an inpatient rehabilitation facility. Will admit to inpatient rehab today.  Preadmission Screen Completed By: Gerlean Ren, 07/13/2015 2:10 PM ______________________________________________________________________  Discussed status with Dr. Naaman Plummer on 07/13/15 at 1411 and received telephone approval for admission today.  Admission Coordinator: Gerlean Ren, time [id number] Sudie Grumbling 07/13/15          Cosigned by: [name], MD at 07/13/2015 2:32 PM  Revision History     Date/Time User Provider Type Action   07/13/2015 2:32 PM [name], MD Physician Cosign   07/13/2015 2:12 PM Gerlean Ren Rehab Admission Coordinator Sign

## 2015-07-13 NOTE — Progress Notes (Signed)
Subjective: Ernest [name] is a 69 y.o. male with at pertinent PMH significant for stroke, seizures, HTN, DM2 with retinopathy, ESRD on dialysis, and CAD s/p stent who presented to [facility] ED for evaluation of diplopia. Patient states that his last known normal was around 11-12 PM. He awoke at 2 AM with double vision and difficulty walking in a straight line. He states that he then went back to sleep. Upon awaking, symptoms were still present. At dialysis, staff noticed ataxia and EMS was called at 11PM, bringing patient to ED. In addition, patient endorsed bilateral tingling in his hands that has been present for years. He denies facial droop, weakness, dysphagia, dysarthria above baseline, headache, nausea, vomiting, dizziness, lightheadednesschest pain, and SOB. Patient has a 6 year history of smoking 2 cigars/day but quit in 1974. He does not drink or use illicit substances.   Of note, patient is on Plavix and states that he is compliant with all of his medications except ASA. He stopped taking ASA a few weeks ago when he ran out.   In the ED, patient was found to be hypertensive and to have a non-reactive pinpoint pupil on right and CNII palsy on right. CT scan showed no active bleed. Neurology was consulted and recommended admission and MRI w/o contrast. MRI was unable to be performed 2/2 to a penile implant of unknown material.    Overnight: No acute overnight events  Subjective: Patient states that he is feeling somewhat improved and is no longer experiencing diplopia. Denies headache, nausea, vomiting, diarrhea, discomfort.  Objective: Vital signs in last 24 hours: Filed Vitals:   07/12/15 1822 07/12/15 2127 07/13/15 0209 07/13/15 0553  BP: 170/71 192/68 188/59 185/62  Pulse: 72 72 63 64  Temp: 97.7 F (36.5 C) 98.4 F (36.9 C) 98.7 F (37.1 C) 98.5 F (36.9 C)  TempSrc: Axillary Oral Oral Oral  Resp: '16 17 18 16  ' Height:      Weight:      SpO2: 93% 95% 93% 94%   Weight  change:  No intake or output data in the 24 hours ending 07/13/15 0806  Physical Exam  Constitutional: He appears well-nourished. No distress.  HENT:  Head: Normocephalic and atraumatic.  Right Ear: External ear normal.  Left Ear: External ear normal.  Mouth/Throat: No oropharyngeal exudate.  Eyes: Conjunctivae are normal. Right eye exhibits no discharge. Left eye discharge: Moderate clear discharge. No scleral icterus.  Left pupil pinpoin and non-reactive   Neck: Normal range of motion. No JVD present.  Cardiovascular: Normal rate, regular rhythm and normal heart sounds.   No murmur heard. Pulmonary/Chest: Effort normal and breath sounds normal. He has no wheezes.  Abdominal: Soft. Bowel sounds are normal. He exhibits no distension. There is no rebound and no guarding.  Musculoskeletal: Normal range of motion. He exhibits no edema.  Lymphadenopathy:    He has no cervical adenopathy.  Neurological: He is alert.  Left pupil pinpoint and non-reactive Left eye unable to adduct or abduct  Strength,sensation and proprioception intact throughout Mild dysmetria bilaterally No pronator drift  Skin: Skin is warm and dry.  Psychiatric: He has a normal mood and affect.     Lab Results: Results for orders placed or performed during the hospital encounter of 07/11/15 (from the past 48 hour(s))  Protime-INR     Status: None   Collection Time: 07/11/15  1:57 PM  Result Value Ref Range   Prothrombin Time 13.2 11.6 - 15.2 seconds   INR 0.98  0.00 - 1.49  APTT     Status: Abnormal   Collection Time: 07/11/15  1:57 PM  Result Value Ref Range   aPTT 20 (L) 24 - 37 seconds  CBC     Status: Abnormal   Collection Time: 07/11/15  1:57 PM  Result Value Ref Range   WBC 6.6 4.0 - 10.5 K/uL   RBC 4.53 4.22 - 5.81 MIL/uL   Hemoglobin 13.1 13.0 - 17.0 g/dL   HCT 38.5 (L) 39.0 - 52.0 %   MCV 85.0 78.0 - 100.0 fL   MCH 28.9 26.0 - 34.0 pg   MCHC 34.0 30.0 - 36.0 g/dL   RDW 14.9 11.5 - 15.5 %    Platelets  150 - 400 K/uL    PLATELET CLUMPS NOTED ON SMEAR, COUNT APPEARS ADEQUATE  Differential     Status: None   Collection Time: 07/11/15  1:57 PM  Result Value Ref Range   Neutrophils Relative % 64 %   Lymphocytes Relative 22 %   Monocytes Relative 10 %   Eosinophils Relative 3 %   Basophils Relative 1 %   Neutro Abs 4.1 1.7 - 7.7 K/uL   Lymphs Abs 1.5 0.7 - 4.0 K/uL   Monocytes Absolute 0.7 0.1 - 1.0 K/uL   Eosinophils Absolute 0.2 0.0 - 0.7 K/uL   Basophils Absolute 0.1 0.0 - 0.1 K/uL   Smear Review FIBRIN STRANDS NOTED   Ethanol     Status: None   Collection Time: 07/11/15  2:00 PM  Result Value Ref Range   Alcohol, Ethyl (B) <5 <5 mg/dL    Comment:        LOWEST DETECTABLE LIMIT FOR SERUM ALCOHOL IS 5 mg/dL FOR MEDICAL PURPOSES ONLY   I-stat troponin, ED (not at [facility])     Status: None   Collection Time: 07/11/15  2:00 PM  Result Value Ref Range   Troponin i, poc 0.02 0.00 - 0.08 ng/mL   Comment 3            Comment: Due to the release kinetics of cTnI, a negative result within the first hours of the onset of symptoms does not rule out myocardial infarction with certainty. If myocardial infarction is still suspected, repeat the test at appropriate intervals.   Comprehensive metabolic panel     Status: Abnormal   Collection Time: 07/11/15  2:00 PM  Result Value Ref Range   Sodium 142 135 - 145 mmol/L   Potassium 3.2 (L) 3.5 - 5.1 mmol/L   Chloride 95 (L) 101 - 111 mmol/L   CO2 37 (H) 22 - 32 mmol/L   Glucose, Bld 100 (H) 65 - 99 mg/dL   BUN 14 6 - 20 mg/dL   Creatinine, Ser 5.63 (H) 0.61 - 1.24 mg/dL   Calcium 8.6 (L) 8.9 - 10.3 mg/dL   Total Protein 6.5 6.5 - 8.1 g/dL   Albumin 3.4 (L) 3.5 - 5.0 g/dL   AST 19 15 - 41 U/L   ALT 16 (L) 17 - 63 U/L   Alkaline Phosphatase 54 38 - 126 U/L   Total Bilirubin 0.4 0.3 - 1.2 mg/dL   GFR calc non Af Amer 9 (L) >60 mL/min   GFR calc Af Amer 11 (L) >60 mL/min    Comment: (NOTE) The eGFR has been calculated  using the CKD EPI equation. This calculation has not been validated in all clinical situations. eGFR's persistently <60 mL/min signify possible Chronic Kidney Disease.    Anion gap  10 5 - 15  I-Stat Chem 8, ED  (not at [facility])     Status: Abnormal   Collection Time: 07/11/15  2:02 PM  Result Value Ref Range   Sodium 140 135 - 145 mmol/L   Potassium 3.9 3.5 - 5.1 mmol/L   Chloride 93 (L) 101 - 111 mmol/L   BUN 22 (H) 6 - 20 mg/dL   Creatinine, Ser 5.10 (H) 0.61 - 1.24 mg/dL   Glucose, Bld 101 (H) 65 - 99 mg/dL   Calcium, Ion 0.96 (L) 1.13 - 1.30 mmol/L   TCO2 37 0 - 100 mmol/L   Hemoglobin 13.6 13.0 - 17.0 g/dL   HCT 40.0 39.0 - 52.0 %  Glucose, capillary     Status: None   Collection Time: 07/11/15  6:24 PM  Result Value Ref Range   Glucose-Capillary 72 65 - 99 mg/dL   Comment 1 Notify RN    Comment 2 Document in Chart   Glucose, capillary     Status: None   Collection Time: 07/11/15  9:03 PM  Result Value Ref Range   Glucose-Capillary 74 65 - 99 mg/dL   Comment 1 Notify RN    Comment 2 Document in Chart   Glucose, capillary     Status: Abnormal   Collection Time: 07/12/15 12:11 AM  Result Value Ref Range   Glucose-Capillary 56 (L) 65 - 99 mg/dL   Comment 1 Notify RN    Comment 2 Document in Chart   Glucose, capillary     Status: Abnormal   Collection Time: 07/12/15 12:40 AM  Result Value Ref Range   Glucose-Capillary 112 (H) 65 - 99 mg/dL  Glucose, capillary     Status: Abnormal   Collection Time: 07/12/15  4:20 AM  Result Value Ref Range   Glucose-Capillary 52 (L) 65 - 99 mg/dL   Comment 1 Notify RN    Comment 2 Document in Chart   Glucose, capillary     Status: Abnormal   Collection Time: 07/12/15  4:47 AM  Result Value Ref Range   Glucose-Capillary 132 (H) 65 - 99 mg/dL  Hemoglobin A1c     Status: Abnormal   Collection Time: 07/12/15  5:35 AM  Result Value Ref Range   Hgb A1c MFr Bld 7.7 (H) 4.8 - 5.6 %    Comment: (NOTE)         Pre-diabetes: 5.7 -  6.4         Diabetes: >6.4         Glycemic control for adults with diabetes: <7.0    Mean Plasma Glucose 174 mg/dL    Comment: (NOTE) Performed At: [facility] [place], [place] [id number] Lindon Romp MD EH:[id number]   TSH     Status: None   Collection Time: 07/12/15  5:35 AM  Result Value Ref Range   TSH 1.064 0.350 - 4.500 uIU/mL  HIV antibody     Status: None   Collection Time: 07/12/15  5:35 AM  Result Value Ref Range   HIV Screen 4th Generation wRfx Non Reactive Non Reactive    Comment: (NOTE) Performed At: [facility] [place], [place] [id number] Lindon Romp MD YY:[id number]   Renal function panel     Status: Abnormal   Collection Time: 07/12/15  5:35 AM  Result Value Ref Range   Sodium 139 135 - 145 mmol/L   Potassium 3.2 (L) 3.5 - 5.1 mmol/L    Comment: DELTA CHECK  NOTED   Chloride 95 (L) 101 - 111 mmol/L   CO2 33 (H) 22 - 32 mmol/L   Glucose, Bld 115 (H) 65 - 99 mg/dL   BUN 18 6 - 20 mg/dL   Creatinine, Ser 7.49 (H) 0.61 - 1.24 mg/dL   Calcium 8.4 (L) 8.9 - 10.3 mg/dL   Phosphorus 4.3 2.5 - 4.6 mg/dL   Albumin 3.2 (L) 3.5 - 5.0 g/dL   GFR calc non Af Amer 7 (L) >60 mL/min   GFR calc Af Amer 8 (L) >60 mL/min    Comment: (NOTE) The eGFR has been calculated using the CKD EPI equation. This calculation has not been validated in all clinical situations. eGFR's persistently <60 mL/min signify possible Chronic Kidney Disease.    Anion gap 11 5 - 15  CBC     Status: Abnormal   Collection Time: 07/12/15  5:35 AM  Result Value Ref Range   WBC 6.5 4.0 - 10.5 K/uL   RBC 4.21 (L) 4.22 - 5.81 MIL/uL   Hemoglobin 12.1 (L) 13.0 - 17.0 g/dL   HCT 36.3 (L) 39.0 - 52.0 %   MCV 86.2 78.0 - 100.0 fL   MCH 28.7 26.0 - 34.0 pg   MCHC 33.3 30.0 - 36.0 g/dL   RDW 14.8 11.5 - 15.5 %   Platelets 156 150 - 400 K/uL  Lipid panel     Status: None   Collection Time: 07/12/15  5:35 AM  Result Value Ref Range   Cholesterol  96 0 - 200 mg/dL   Triglycerides 79 <150 mg/dL   HDL 44 >40 mg/dL   Total CHOL/HDL Ratio 2.2 RATIO   VLDL 16 0 - 40 mg/dL   LDL Cholesterol 36 0 - 99 mg/dL    Comment:        Total Cholesterol/HDL:CHD Risk Coronary Heart Disease Risk Table                     Men   Women  1/2 Average Risk   3.4   3.3  Average Risk       5.0   4.4  2 X Average Risk   9.6   7.1  3 X Average Risk  23.4   11.0        Use the calculated Patient Ratio above and the CHD Risk Table to determine the patient's CHD Risk.        ATP III CLASSIFICATION (LDL):  <100     mg/dL   Optimal  100-129  mg/dL   Near or Above                    Optimal  130-159  mg/dL   Borderline  160-189  mg/dL   High  >190     mg/dL   Very High   Hepatitis C antibody     Status: None   Collection Time: 07/12/15  5:35 AM  Result Value Ref Range   HCV Ab <0.1 0.0 - 0.9 s/co ratio    Comment: (NOTE)                                  Negative:     < 0.8                             Indeterminate: 0.8 - 0.9  Positive:     > 0.9 The CDC recommends that a positive HCV antibody result be followed up with a HCV Nucleic Acid Amplification test (612244). Performed At: [facility] [place], [place] [id number] Lindon Romp MD MY:[id number]   Glucose, capillary     Status: None   Collection Time: 07/12/15  7:39 AM  Result Value Ref Range   Glucose-Capillary 73 65 - 99 mg/dL   Comment 1 Notify RN    Comment 2 Document in Chart   Glucose, capillary     Status: Abnormal   Collection Time: 07/12/15 11:29 AM  Result Value Ref Range   Glucose-Capillary 163 (H) 65 - 99 mg/dL  Glucose, capillary     Status: Abnormal   Collection Time: 07/12/15  2:01 PM  Result Value Ref Range   Glucose-Capillary 147 (H) 65 - 99 mg/dL  Glucose, capillary     Status: Abnormal   Collection Time: 07/12/15  4:22 PM  Result Value Ref Range   Glucose-Capillary 108 (H) 65 - 99 mg/dL   Comment 1  Notify RN    Comment 2 Document in Chart   Glucose, capillary     Status: Abnormal   Collection Time: 07/12/15  8:36 PM  Result Value Ref Range   Glucose-Capillary 192 (H) 65 - 99 mg/dL   Comment 1 Notify RN    Comment 2 Document in Chart   Glucose, capillary     Status: Abnormal   Collection Time: 07/13/15 12:04 AM  Result Value Ref Range   Glucose-Capillary 146 (H) 65 - 99 mg/dL   Comment 1 Notify RN    Comment 2 Document in Chart   Glucose, capillary     Status: None   Collection Time: 07/13/15  4:06 AM  Result Value Ref Range   Glucose-Capillary 82 65 - 99 mg/dL   Comment 1 Notify RN    Comment 2 Document in Chart   Glucose, capillary     Status: None   Collection Time: 07/13/15  7:17 AM  Result Value Ref Range   Glucose-Capillary 72 65 - 99 mg/dL    Micro Results: No results found for this or any previous visit (from the past 240 hour(s)). Studies/Results: Dg Chest 2 View  07/11/2015   CLINICAL DATA:  Left eye vision changes today. Hemodialysis patient. Initial encounter.  EXAM: CHEST  2 VIEW  COMPARISON:  04/29/2015 and 12/05/2014.  FINDINGS: The heart size and mediastinal contours are stable. Right axillary vascular stent noted. The overall basilar aeration has improved. There is no confluent airspace opacity, edema or significant pleural effusion. The bones appear unchanged.  IMPRESSION: No acute cardiopulmonary process.   Electronically Signed   By: Richardean Sale M.D.   On: 07/11/2015 16:57   Ct Head Wo Contrast  07/11/2015   CLINICAL DATA:  Intracranial hemorrhage. LEFT pupil nonreactive. Stroke with speech changes.  EXAM: CT HEAD WITHOUT CONTRAST  TECHNIQUE: Contiguous axial images were obtained from the base of the skull through the vertex without intravenous contrast.  COMPARISON:  04/25/2015.  FINDINGS: No mass lesion, mass effect, midline shift, hydrocephalus, hemorrhage. No acute territorial cortical ischemia/infarct. Atrophy and chronic ischemic white matter  disease is present. Benign basal ganglia calcifications along with scattered parenchymal calcifications. These are chronic findings and appears similar to the prior exam. Old lacunar infarct in the RIGHT cerebellar hemisphere. Visible paranasal sinuses appear normal. Dense intracranial atherosclerosis.  IMPRESSION: Atrophy, chronic ischemic white matter disease and old lacunar infarcts without acute  intracranial abnormality. No interval change from 04/25/2015.   Electronically Signed   By: Dereck Ligas M.D.   On: 07/11/2015 13:49   Medications: I have reviewed the patient's current medications. Scheduled Meds: . allopurinol  100 mg Oral Daily  . amitriptyline  25 mg Oral QHS  . antiseptic oral rinse  7 mL Mouth Rinse q12n4p  . aspirin EC  81 mg Oral Daily  . calcium acetate  2,001 mg Oral TID WC  . chlorhexidine  15 mL Mouth Rinse BID  . cinacalcet  60 mg Oral BID WC  . clopidogrel  75 mg Oral Daily  . colchicine  0.6 mg Oral Daily  . feeding supplement (NEPRO CARB STEADY)  237 mL Oral BID BM  . heparin  5,000 Units Subcutaneous 3 times per day  . Influenza vac split quadrivalent PF  0.5 mL Intramuscular Tomorrow-1000  . insulin aspart  0-9 Units Subcutaneous TID WC  . insulin NPH Human  8 Units Subcutaneous BID AC & HS  . levETIRAcetam  1,000 mg Oral Daily  . levETIRAcetam  500 mg Oral Q M,W,F-2000  . multivitamin  1 tablet Oral QHS  . sevelamer carbonate  3,200 mg Oral TID WC  . sevelamer carbonate  800 mg Oral Q1500  . simvastatin  20 mg Oral q1800   Continuous Infusions:  PRN Meds:.senna-docusate Assessment/Plan: Principal Problem:   Stroke Active Problems:   ESRD (end stage renal disease)   Diplopia  Assessment: KAHLE MCQUEEN is a 69 y.o. male with a hx of stroke, seizure, CAD, HTN, and ESRD who presents with symptoms concerning for CVA.  ##Possible [place] CVA- Patient outside of TPA window. Non-contrast CT scan show no acute process. MRI not done due to unknown implant  status. CT angiogram not performed due to lack of access. Non-contrast CT will be utilized. Continue dual platelet therapy for cardioprotective and neuro protective properties indefinitely, as per neurology.  -Clopidogrel 62m -ASA 81 mg PO daily  -f/u Echocardiogram  -f/u non-con CT  -[facility], [place]ies having any patient records  [facility] non-con CT as per PT recs.   ##Chronic CHF -Hold Antihypertensives to allow permissive HTN to 220/120, hold until outpatient f/u  #CAD:  -ASA 81 mg PO daily -Clopidogrel 75 mg daily   #Seizures:  -IV Keppra 1000 mg daily   ##Dysphagia: -Carb modified diet -Tolerates PO meds   ##Diabetes Mellitus, Type II: HbA1C 7.7, up from 6.6 1 month ago. Not adequately managed -Insulin NPH Human 8 unit IV BID -SSI  -outpatient diabetes management f/u   ##Hypoglycemia: Resolved, likely 2/2 to NPO status. Patient on PO diet now.   ##HTN -Hold home meds. -Permissive HTN up to 220/120  ##ESRD:  -Dialysis M/W/F -Cinacalcet 60 mg PO for secondary hyperparathyroism -Calcium Acetate 2,001 PO 3x daily -Sevelamer carbonate 80 -RENA-VIT- 1 tablet  -Monitor Intake/Output daily -Receiving inpatient dialysis on MWF, dialysis today 09/28  #HLD -simvastatin 20 mg PO daily   ##Gout -Allopurinol 1020mdaily -Cochicine 0.6 mg PO daily  #Infectous Disease- History of Hep C -Hep C antibody negative  -HIV Antibody negative  #GI -Ducosate 1 tablet PO at bedtime  This is a [employer] information officerote.  The care of the patient was discussed with Dr. ViEvette Doffingnd the assessment and plan formulated with their assistance.  Please see their attached note for official documentation of the daily encounter.   LOS:  2 days   Florentina Addison, Med Student 07/13/2015, 8:06 AM

## 2015-07-13 NOTE — Interval H&P Note (Signed)
Ernest [name] was admitted today to Inpatient Rehabilitation with the diagnosis of right pontine infarct.  The patient's history has been reviewed, patient examined, and there is no change in status.  Patient continues to be appropriate for intensive inpatient rehabilitation.  I have reviewed the patient's chart and labs.  Questions were answered to the patient's satisfaction. The PAPE has been reviewed and assessment remains appropriate.  Ernest [name],Ernest [name] 07/13/2015, 7:39 PM

## 2015-07-13 NOTE — Progress Notes (Signed)
Patient left unit with all belongings.

## 2015-07-13 NOTE — Progress Notes (Signed)
OT Cancellation Note  Patient Details Name: Ernest [name] MRN: [id number] DOB: 06-Jun-1946   Cancelled Treatment:    Reason Eval/Treat Not Completed: Patient at procedure or test/ unavailable (Currently in HD.  Will follow.)  Malka So 07/13/2015, 8:43 AM  [phone]

## 2015-07-13 NOTE — Progress Notes (Signed)
PT Cancellation Note  Patient Details Name: Ernest [name] MRN: [id number] DOB: Feb 17, 1946   Cancelled Treatment:    Reason Eval/Treat Not Completed: Patient at procedure or test/unavailable. Pt off floor at HD. PT to return as able.   Kingsley Callander 07/13/2015, 9:33 AM   Kittie Plater, PT, DPT Pager #: [phone] Office #: [phone]

## 2015-07-13 NOTE — H&P (View-Only) (Signed)
  Physical Medicine and Rehabilitation Admission H&P    Chief Complaint  Patient presents with  . Eye Problem  : HPI: Ernest [name] is a 69 y.o. right handed male with history of end-stage renal disease with hemodialysis Monday Wednesday Friday at [facility], hypertension, seizure disorder maintained on Keppra, diabetes mellitus peripheral neuropathy, CAD with stenting and CVA with little residual weakness . Patient had been on aspirin and Plavix but ran out of aspirin was taking only Plavix. Presented 07/11/2015 with diplopia and slurred speech. Lives with spouse independent with single-point cane prior to admission. Wife is physically limited to provide assistance. Cranial CT scan with chronic ischemic white matter disease old lacunar infarct without acute intracranial abnormality. Echocardiogram with ejection fraction of 50% grade 1 diastolic dysfunction. Carotid Dopplers with no ICA stenosis. MRI of the brain not completed due to history of penile implant. Follow up cranial CT pending.. Neurology consulted presently remains on aspirin and Plavix. Subcutaneous heparin for DVT prophylaxis. Regular consistency diet. Physical and occupational therapy evaluation completed with recommendations of physical medicine rehabilitation consult. Patient was admitted for a comprehensive rehabilitation program  ROS Review of Systems  Constitutional: Negative for fever and chills.  HENT: Negative for hearing loss.  Eyes: Positive for blurred vision and double vision.  Respiratory: Negative for cough.   Shortness of breath on exertion.  Cardiovascular: Negative for chest pain, palpitations and leg swelling.  Gastrointestinal: Positive for constipation. Negative for nausea, vomiting and abdominal pain.  Genitourinary: Negative for dysuria and hematuria.  Musculoskeletal: Positive for myalgias and joint pain.  Skin: Negative for rash.  Neurological: Positive for weakness. Positive   for seizures, loss of consciousness and headaches    Past Medical History  Diagnosis Date  . Hyperlipidemia   . Retinopathy   . Gout   . ESRD (end stage renal disease)     Started HD in [place] in 2009, ESRD was due to DM. Moved to [place] in Dec 2009 and now gets dialysis at [facility] on a MWF schedule.     . Erectile dysfunction     penile implant  . Abnormal stress test     s/p cath November 2013 with modest disease involving the ostial left main, proximal LAD, proximal RCA - do not appear to be hemodynamically signficant; mild LV dysfunction  . Obesity   . Hypertension   . Hepatitis C   . COPD (chronic obstructive pulmonary disease)   . Coronary artery disease     per cath report 2013  . Ejection fraction < 50%   . Chronic systolic CHF (congestive heart failure)   . Bacteremia   . Shortness of breath     Hx: of with exertion  . Gout     Hx: of  . Stroke   . Type II or unspecified type diabetes mellitus without mention of complication, not stated as uncontrolled     adult onset   Past Surgical History  Procedure Laterality Date  . Penile prosthesis implant      1997.. no card  . Fistula      RUE and wrist  . Cardiac catheterization  August 26, 2012  . Eye surgery      laser.  and surgery for DM  . Insertion of dialysis catheter Right 01/01/2013    Procedure: INSERTION OF DIALYSIS CATHETER right  internal jugular;  Surgeon: Vance W Brabham, MD;  Location: [facility] OR;  Service: Vascular;  Laterality: Right;  . Av   fistula placement Left 01/13/2013    Procedure: INSERTION OF ARTERIOVENOUS (AV) GORE-TEX GRAFT ARM;  Surgeon: Christopher S Dickson, MD;  Location: [facility] OR;  Service: Vascular;  Laterality: Left;  . Thrombectomy w/ embolectomy Left 03/26/2013    Procedure: Attempted thrombectomy of left arm arteriovenous goretex graft.;  Surgeon: Christopher S Dickson, MD;  Location: [facility] OR;  Service: Vascular;  Laterality: Left;  . Insertion of dialysis catheter N/A 03/26/2013     Procedure: INSERTION OF DIALYSIS CATHETER Left internal jugular vein;  Surgeon: Christopher S Dickson, MD;  Location: [facility] OR;  Service: Vascular;  Laterality: N/A;  . Avgg removal Left 03/26/2013    Procedure: REMOVAL OF  NON INCORPORATED ARTERIOVENOUS GORETEX GRAFT (AVGG) left arm * repair of  left brachial artery with vein patch angioplasty.;  Surgeon: Christopher S Dickson, MD;  Location: [facility] OR;  Service: Vascular;  Laterality: Left;  . Av fistula placement Left 05/05/2013    Procedure: INSERTION OF LEFT UPPER ARM  ARTERIOVENOUS GORTEX GRAFT;  Surgeon: Christopher S Dickson, MD;  Location: [facility] OR;  Service: Vascular;  Laterality: Left;  . Tee without cardioversion N/A 06/11/2013    Procedure: TRANSESOPHAGEAL ECHOCARDIOGRAM (TEE);  Surgeon: Dalton S McLean, MD;  Location: [facility] ENDOSCOPY;  Service: Cardiovascular;  Laterality: N/A;  . Embolectomy Right 08/27/2013    Procedure: EMBOLECTOMY;  Surgeon: James D Lawson, MD;  Location: [facility] OR;  Service: Vascular;  Laterality: Right;  Thrombectomy of Radial and ulnar artery.  . Cataract extraction w/ intraocular lens  implant, bilateral    . Colonoscopy    . Ligation of arteriovenous  fistula Right 12/30/2013    Procedure: REMOVAL OF SEGMENT OF GORTEX GRAFT AND FISTULA  AND REPAIR OF BRACHIAL ARTERY;  Surgeon: James D Lawson, MD;  Location: [facility];  Service: Vascular;  Laterality: Right;  . Venogram N/A 04/07/2013    Procedure: VENOGRAM;  Surgeon: Vance W Brabham, MD;  Location: [facility] CATH LAB;  Service: Cardiovascular;  Laterality: N/A;   Family History  Problem Relation Age of Onset  . Heart disease Father   . Diabetes Sister   . Diabetes Brother   . Diabetes Son    Social History:  reports that he quit smoking about 42 years ago. He has never used smokeless tobacco. He reports that he does not drink alcohol or use illicit drugs. Allergies: [name] Medications Prior to Admission  Medication Sig Dispense Refill  . allopurinol (ZYLOPRIM) 100 MG  tablet Take 100 mg by mouth daily.     . amitriptyline (ELAVIL) 25 MG tablet TAKE ONE TABLET BY MOUTH AT BEDTIME FOR  NEUROPATHY (Patient taking differently: TAKE ONE TABLET BY MOUTH AT BEDTIME AS NEED  FOR  NEUROPATHY) 90 tablet 0  . aspirin EC 81 MG tablet Take 81 mg by mouth daily.    . Calcium Acetate 667 MG TABS Take 2,001 tablets by mouth 3 (three) times daily with meals.     . cinacalcet (SENSIPAR) 60 MG tablet Take 60 mg by mouth 2 (two) times daily.     . clopidogrel (PLAVIX) 75 MG tablet Take 75 mg by mouth daily.     . colchicine 0.6 MG tablet Take 0.6 mg by mouth daily.     . insulin NPH Human (HUMULIN N,NOVOLIN N) 100 UNIT/ML injection Inject 0.1 mLs (10 Units total) into the skin 2 (two) times daily. 10 mL 11  . levETIRAcetam (KEPPRA) 500 MG tablet Take 2 tablets (1,000 mg total) by mouth daily. Then take extra 500 mg (  1 tablet) on MWF after HD 72 tablet 5  . lidocaine-prilocaine (EMLA) cream Apply 1 application topically as needed (dialysis).    . lisinopril (PRINIVIL,ZESTRIL) 20 MG tablet Take 20 mg by mouth daily.    . metoprolol (LOPRESSOR) 50 MG tablet Take 50 mg by mouth daily.     . multivitamin (RENA-VIT) TABS tablet Take 1 tablet by mouth daily.     . sevelamer (RENVELA) 800 MG tablet Take 800-3,200 mg by mouth 5 (five) times daily. 4 tablets three times daily with meals and 1 tablet twice daily with snacks.    . simvastatin (ZOCOR) 20 MG tablet Take 20 mg by mouth daily at 6 PM.       Home: Home Living Family/patient expects to be discharged to:: Private residence Living Arrangements: Spouse/significant other Available Help at Discharge: Family Type of Home: House Home Access: Stairs to enter Entrance Stairs-Number of Steps: 1 Home Layout: Two level, 1/2 bath on main level Alternate Level Stairs-Number of Steps: 14 Alternate Level Stairs-Rails: Left Bathroom Shower/Tub: Tub/shower unit (garden tub) Bathroom Toilet: Standard Home Equipment: Walker - 2 wheels, Cane  - single point, Shower seat  Lives With: Spouse, Daughter   Functional History: Prior Function Level of Independence: Independent Comments: pt was bathing, dressing, ambulating without AD. Wife has a bad back and cannot physically assist pt, wife drives.  Functional Status:  Mobility: Bed Mobility Overal bed mobility: Needs Assistance Bed Mobility: Supine to Sit Supine to sit: Supervision General bed mobility comments: pt in chair Transfers Overall transfer level: Needs assistance Equipment used: Rolling walker (2 wheeled) Transfers: Sit to/from Stand Sit to Stand: Min assist General transfer comment: posterior bias initially, min assist for balance, verbal cues for hand placement Ambulation/Gait Ambulation/Gait assistance: Min assist, Mod assist Ambulation Distance (Feet): 120 Feet Assistive device: Rolling walker (2 wheeled), None Gait Pattern/deviations: Step-through pattern, Wide base of support General Gait Details: attempted to ambulate without AD however pt required modA to maintain balance and was reaching for hallway rail. pt with noted difficulty with spatial awareness, likely due to vision deficits. once given RW pt with increased stabilty however still required minA during turning for safety Gait velocity: slow    ADL: ADL Overall ADL's : Needs assistance/impaired Eating/Feeding Details (indicate cue type and reason): awaiting meal tray Grooming: Wash/dry hands, Wash/dry face, Standing, Minimal assistance Grooming Details (indicate cue type and reason): cues for locating paper towels, soap  Upper Body Bathing: Minimal assitance, Sitting Upper Body Bathing Details (indicate cue type and reason): assisted with back Lower Body Bathing: Minimal assistance, Sit to/from stand Upper Body Dressing : Minimal assistance, Sitting Upper Body Dressing Details (indicate cue type and reason): assist to orient Lower Body Dressing: Minimal assistance, Sit to/from stand Lower Body  Dressing Details (indicate cue type and reason): able to don and doff socks, steadying assist needed to pull up pants Toilet Transfer: Minimal assistance, RW, Ambulation, Regular Toilet Toileting- Clothing Manipulation and Hygiene: Minimal assistance, Sit to/from stand Functional mobility during ADLs: Minimal assistance, Rolling walker  Cognition: Cognition Overall Cognitive Status: History of cognitive impairments - at baseline (6th grade education, wife states he is at baseline) Arousal/Alertness: Awake/alert Orientation Level: Oriented X4 Memory: Impaired Memory Impairment: Storage deficit, Retrieval deficit, Decreased recall of new information (pt did not recall having CT scan ) Awareness: Appears intact (aware to visual deficits) Problem Solving: Appears intact (for basic to call for assist and not try to get OOB on his own) Cognition Arousal/Alertness: Awake/alert Behavior During Therapy:   WFL for tasks assessed/performed Overall Cognitive Status: History of cognitive impairments - at baseline (6th grade education, wife states he is at baseline)  Physical Exam: Blood pressure 185/62, pulse 64, temperature 98.5 F (36.9 C), temperature source Oral, resp. rate 16, height 6' (1.829 m), weight 102.059 kg (225 lb), SpO2 94 %. Physical Exam Constitutional: He appears well-developed and well-nourished.  HENT: oral mucosa pink and moist.  Head: Normocephalic and atraumatic.  Eyes: No scleral icterus.  Pupils reactive to light without nystagmus  Neck: Normal range of motion. Neck supple. No JVD present. No tracheal deviation present. No thyromegaly present.  Cardiovascular: Normal rate and regular rhythm. no murmurs or rubs Respiratory: Effort normal and breath sounds normal. No respiratory distress. No wheezes or rales GI: Soft. Bowel sounds are normal. He exhibits no distension.  Neurological: He is alert.  Has difficulty tracking to the left. Left CN6 palsy. Struggles tracking to  midline but is able to accomplish inconsistently. Appears to track fairly easily in other directions. Past points with both hands, left more than right. ?ataxic. No PD. Speech is a bit dysarthric but intelligible. Fair awareness of deficits. Follows commands. MMT: 4+ to 5/5 bilateral UE delt,bicep,tricep, wrist, fingers LE: 4/5 HF, KE and ADF/APF bilaterally. Decreased LT in both feet in stocking glove distribution. Fair insight and awareness.   Skin: Skin is warm and dry.  Psychiatric: He has a normal mood and affect. His behavior is normal    Results for orders placed or performed during the hospital encounter of 07/11/15 (from the past 48 hour(s))  Protime-INR     Status: None   Collection Time: 07/11/15  1:57 PM  Result Value Ref Range   Prothrombin Time 13.2 11.6 - 15.2 seconds   INR 0.98 0.00 - 1.49  APTT     Status: Abnormal   Collection Time: 07/11/15  1:57 PM  Result Value Ref Range   aPTT 20 (L) 24 - 37 seconds  CBC     Status: Abnormal   Collection Time: 07/11/15  1:57 PM  Result Value Ref Range   WBC 6.6 4.0 - 10.5 K/uL   RBC 4.53 4.22 - 5.81 MIL/uL   Hemoglobin 13.1 13.0 - 17.0 g/dL   HCT 38.5 (L) 39.0 - 52.0 %   MCV 85.0 78.0 - 100.0 fL   MCH 28.9 26.0 - 34.0 pg   MCHC 34.0 30.0 - 36.0 g/dL   RDW 14.9 11.5 - 15.5 %   Platelets  150 - 400 K/uL    PLATELET CLUMPS NOTED ON SMEAR, COUNT APPEARS ADEQUATE  Differential     Status: None   Collection Time: 07/11/15  1:57 PM  Result Value Ref Range   Neutrophils Relative % 64 %   Lymphocytes Relative 22 %   Monocytes Relative 10 %   Eosinophils Relative 3 %   Basophils Relative 1 %   Neutro Abs 4.1 1.7 - 7.7 K/uL   Lymphs Abs 1.5 0.7 - 4.0 K/uL   Monocytes Absolute 0.7 0.1 - 1.0 K/uL   Eosinophils Absolute 0.2 0.0 - 0.7 K/uL   Basophils Absolute 0.1 0.0 - 0.1 K/uL   Smear Review FIBRIN STRANDS NOTED   Ethanol     Status: None   Collection Time: 07/11/15  2:00 PM  Result Value Ref Range   Alcohol, Ethyl (B) <5 <5  mg/dL    Comment:        LOWEST DETECTABLE LIMIT FOR SERUM ALCOHOL IS 5 mg/dL FOR MEDICAL PURPOSES ONLY     I-stat troponin, ED (not at [facility], [facility])     Status: None   Collection Time: 07/11/15  2:00 PM  Result Value Ref Range   Troponin i, poc 0.02 0.00 - 0.08 ng/mL   Comment 3            Comment: Due to the release kinetics of cTnI, a negative result within the first hours of the onset of symptoms does not rule out myocardial infarction with certainty. If myocardial infarction is still suspected, repeat the test at appropriate intervals.   Comprehensive metabolic panel     Status: Abnormal   Collection Time: 07/11/15  2:00 PM  Result Value Ref Range   Sodium 142 135 - 145 mmol/L   Potassium 3.2 (L) 3.5 - 5.1 mmol/L   Chloride 95 (L) 101 - 111 mmol/L   CO2 37 (H) 22 - 32 mmol/L   Glucose, Bld 100 (H) 65 - 99 mg/dL   BUN 14 6 - 20 mg/dL   Creatinine, Ser 5.63 (H) 0.61 - 1.24 mg/dL   Calcium 8.6 (L) 8.9 - 10.3 mg/dL   Total Protein 6.5 6.5 - 8.1 g/dL   Albumin 3.4 (L) 3.5 - 5.0 g/dL   AST 19 15 - 41 U/L   ALT 16 (L) 17 - 63 U/L   Alkaline Phosphatase 54 38 - 126 U/L   Total Bilirubin 0.4 0.3 - 1.2 mg/dL   GFR calc non Af Amer 9 (L) >60 mL/min   GFR calc Af Amer 11 (L) >60 mL/min    Comment: (NOTE) The eGFR has been calculated using the CKD EPI equation. This calculation has not been validated in all clinical situations. eGFR's persistently <60 mL/min signify possible Chronic Kidney Disease.    Anion gap 10 5 - 15  I-Stat Chem 8, ED  (not at [facility], [facility])     Status: Abnormal   Collection Time: 07/11/15  2:02 PM  Result Value Ref Range   Sodium 140 135 - 145 mmol/L   Potassium 3.9 3.5 - 5.1 mmol/L   Chloride 93 (L) 101 - 111 mmol/L   BUN 22 (H) 6 - 20 mg/dL   Creatinine, Ser 5.10 (H) 0.61 - 1.24 mg/dL   Glucose, Bld 101 (H) 65 - 99 mg/dL   Calcium, Ion 0.96 (L) 1.13 - 1.30 mmol/L   TCO2 37 0 - 100 mmol/L   Hemoglobin 13.6 13.0 - 17.0 g/dL   HCT 40.0 39.0 - 52.0 %    Glucose, capillary     Status: None   Collection Time: 07/11/15  6:24 PM  Result Value Ref Range   Glucose-Capillary 72 65 - 99 mg/dL   Comment 1 Notify RN    Comment 2 Document in Chart   Glucose, capillary     Status: None   Collection Time: 07/11/15  9:03 PM  Result Value Ref Range   Glucose-Capillary 74 65 - 99 mg/dL   Comment 1 Notify RN    Comment 2 Document in Chart   Glucose, capillary     Status: Abnormal   Collection Time: 07/12/15 12:11 AM  Result Value Ref Range   Glucose-Capillary 56 (L) 65 - 99 mg/dL   Comment 1 Notify RN    Comment 2 Document in Chart   Glucose, capillary     Status: Abnormal   Collection Time: 07/12/15 12:40 AM  Result Value Ref Range   Glucose-Capillary 112 (H) 65 - 99 mg/dL  Glucose, capillary     Status: Abnormal   Collection   Time: 07/12/15  4:20 AM  Result Value Ref Range   Glucose-Capillary 52 (L) 65 - 99 mg/dL   Comment 1 Notify RN    Comment 2 Document in Chart   Glucose, capillary     Status: Abnormal   Collection Time: 07/12/15  4:47 AM  Result Value Ref Range   Glucose-Capillary 132 (H) 65 - 99 mg/dL  Hemoglobin A1c     Status: Abnormal   Collection Time: 07/12/15  5:35 AM  Result Value Ref Range   Hgb A1c MFr Bld 7.7 (H) 4.8 - 5.6 %    Comment: (NOTE)         Pre-diabetes: 5.7 - 6.4         Diabetes: >6.4         Glycemic control for adults with diabetes: <7.0    Mean Plasma Glucose 174 mg/dL    Comment: (NOTE) Performed At: [facility] [address] [place], [place] [id number] Hancock William F MD Ph:[phone]   TSH     Status: None   Collection Time: 07/12/15  5:35 AM  Result Value Ref Range   TSH 1.064 0.350 - 4.500 uIU/mL  HIV antibody     Status: None   Collection Time: 07/12/15  5:35 AM  Result Value Ref Range   HIV Screen 4th Generation wRfx Non Reactive Non Reactive    Comment: (NOTE) Performed At: [facility] [place] [address] [place], [place] [id number] Hancock William F MD Ph:[phone]    Renal function panel     Status: Abnormal   Collection Time: 07/12/15  5:35 AM  Result Value Ref Range   Sodium 139 135 - 145 mmol/L   Potassium 3.2 (L) 3.5 - 5.1 mmol/L    Comment: DELTA CHECK NOTED   Chloride 95 (L) 101 - 111 mmol/L   CO2 33 (H) 22 - 32 mmol/L   Glucose, Bld 115 (H) 65 - 99 mg/dL   BUN 18 6 - 20 mg/dL   Creatinine, Ser 7.49 (H) 0.61 - 1.24 mg/dL   Calcium 8.4 (L) 8.9 - 10.3 mg/dL   Phosphorus 4.3 2.5 - 4.6 mg/dL   Albumin 3.2 (L) 3.5 - 5.0 g/dL   GFR calc non Af Amer 7 (L) >60 mL/min   GFR calc Af Amer 8 (L) >60 mL/min    Comment: (NOTE) The eGFR has been calculated using the CKD EPI equation. This calculation has not been validated in all clinical situations. eGFR's persistently <60 mL/min signify possible Chronic Kidney Disease.    Anion gap 11 5 - 15  CBC     Status: Abnormal   Collection Time: 07/12/15  5:35 AM  Result Value Ref Range   WBC 6.5 4.0 - 10.5 K/uL   RBC 4.21 (L) 4.22 - 5.81 MIL/uL   Hemoglobin 12.1 (L) 13.0 - 17.0 g/dL   HCT 36.3 (L) 39.0 - 52.0 %   MCV 86.2 78.0 - 100.0 fL   MCH 28.7 26.0 - 34.0 pg   MCHC 33.3 30.0 - 36.0 g/dL   RDW 14.8 11.5 - 15.5 %   Platelets 156 150 - 400 K/uL  Lipid panel     Status: None   Collection Time: 07/12/15  5:35 AM  Result Value Ref Range   Cholesterol 96 0 - 200 mg/dL   Triglycerides 79 <150 mg/dL   HDL 44 >40 mg/dL   Total CHOL/HDL Ratio 2.2 RATIO   VLDL 16 0 - 40 mg/dL   LDL Cholesterol 36 0 - 99   mg/dL    Comment:        Total Cholesterol/HDL:CHD Risk Coronary Heart Disease Risk Table                     Men   Women  1/2 Average Risk   3.4   3.3  Average Risk       5.0   4.4  2 X Average Risk   9.6   7.1  3 X Average Risk  23.4   11.0        Use the calculated Patient Ratio above and the CHD Risk Table to determine the patient's CHD Risk.        ATP III CLASSIFICATION (LDL):  <100     mg/dL   Optimal  100-129  mg/dL   Near or Above                    Optimal  130-159  mg/dL    Borderline  160-189  mg/dL   High  >190     mg/dL   Very High   Glucose, capillary     Status: None   Collection Time: 07/12/15  7:39 AM  Result Value Ref Range   Glucose-Capillary 73 65 - 99 mg/dL   Comment 1 Notify RN    Comment 2 Document in Chart   Glucose, capillary     Status: Abnormal   Collection Time: 07/12/15 11:29 AM  Result Value Ref Range   Glucose-Capillary 163 (H) 65 - 99 mg/dL  Glucose, capillary     Status: Abnormal   Collection Time: 07/12/15  2:01 PM  Result Value Ref Range   Glucose-Capillary 147 (H) 65 - 99 mg/dL  Glucose, capillary     Status: Abnormal   Collection Time: 07/12/15  4:22 PM  Result Value Ref Range   Glucose-Capillary 108 (H) 65 - 99 mg/dL   Comment 1 Notify RN    Comment 2 Document in Chart   Glucose, capillary     Status: Abnormal   Collection Time: 07/12/15  8:36 PM  Result Value Ref Range   Glucose-Capillary 192 (H) 65 - 99 mg/dL   Comment 1 Notify RN    Comment 2 Document in Chart   Glucose, capillary     Status: Abnormal   Collection Time: 07/13/15 12:04 AM  Result Value Ref Range   Glucose-Capillary 146 (H) 65 - 99 mg/dL   Comment 1 Notify RN    Comment 2 Document in Chart   Glucose, capillary     Status: None   Collection Time: 07/13/15  4:06 AM  Result Value Ref Range   Glucose-Capillary 82 65 - 99 mg/dL   Comment 1 Notify RN    Comment 2 Document in Chart    Dg Chest 2 View  07/11/2015   CLINICAL DATA:  Left eye vision changes today. Hemodialysis patient. Initial encounter.  EXAM: CHEST  2 VIEW  COMPARISON:  04/29/2015 and 12/05/2014.  FINDINGS: The heart size and mediastinal contours are stable. Right axillary vascular stent noted. The overall basilar aeration has improved. There is no confluent airspace opacity, edema or significant pleural effusion. The bones appear unchanged.  IMPRESSION: No acute cardiopulmonary process.   Electronically Signed   By: William  Veazey M.D.   On: 07/11/2015 16:57   Ct Head Wo  Contrast  07/11/2015   CLINICAL DATA:  Intracranial hemorrhage. LEFT pupil nonreactive. Stroke with speech changes.  EXAM: CT HEAD WITHOUT CONTRAST  TECHNIQUE:   Contiguous axial images were obtained from the base of the skull through the vertex without intravenous contrast.  COMPARISON:  04/25/2015.  FINDINGS: No mass lesion, mass effect, midline shift, hydrocephalus, hemorrhage. No acute territorial cortical ischemia/infarct. Atrophy and chronic ischemic white matter disease is present. Benign basal ganglia calcifications along with scattered parenchymal calcifications. These are chronic findings and appears similar to the prior exam. Old lacunar infarct in the RIGHT cerebellar hemisphere. Visible paranasal sinuses appear normal. Dense intracranial atherosclerosis.  IMPRESSION: Atrophy, chronic ischemic white matter disease and old lacunar infarcts without acute intracranial abnormality. No interval change from 04/25/2015.   Electronically Signed   By: Geoffrey  Lamke M.D.   On: 07/11/2015 13:49       Medical Problem List and Plan: 1. Functional deficits secondary to pontine infarct with diplopia, speech, and balance deficits 2.  DVT Prophylaxis/Anticoagulation: Subcutaneous heparin. Monitor platelet counts and any signs of bleeding 3. Pain Management: Tylenol as needed 4. End-stage renal disease. Continue hemodialysis as per renal services. HD will be after therapies to avoid conflict with therapy day 5. Neuropsych: This patient is capable of making decisions on his own behalf. 6. Skin/Wound Care: Routine skin checks. Encourage nutrition 7. Fluids/Electrolytes/Nutrition: Routine I&O with follow-up chemistries upon admission 8. Seizure disorder. Keppra 1000 mg daily. Takes extra 500 mg tablet Monday Wednesday Friday after dialysis. Monitor for any seizure activity 9. Diabetes mellitus of peripheral neuropathy. Hemoglobin A1c 7.7. Insulin NPH 8 units twice a day. Check blood sugars before meals and at  bedtime 10. History of gout. Continue Zyloprim daily, colchicine 0.6 mg daily. Monitor for any gout flareups 11. Hyperlipidemia. Zocor  Post Admission Physician Evaluation: 1. Functional deficits secondary  to pontine infarct. 2. Patient is admitted to receive collaborative, interdisciplinary care between the physiatrist, rehab nursing staff, and therapy team. 3. Patient's level of medical complexity and substantial therapy needs in context of that medical necessity cannot be provided at a lesser intensity of care such as a SNF. 4. Patient has experienced substantial functional loss from his/her baseline which was documented above under the "Functional History" and "Functional Status" headings.  Judging by the patient's diagnosis, physical exam, and functional history, the patient has potential for functional progress which will result in measurable gains while on inpatient rehab.  These gains will be of substantial and practical use upon discharge  in facilitating mobility and self-care at the household level. 5. Physiatrist will provide 24 hour management of medical needs as well as oversight of the therapy plan/treatment and provide guidance as appropriate regarding the interaction of the two. 6. 24 hour rehab nursing will assist with bladder management, bowel management, safety, skin/wound care, disease management, medication administration, pain management and patient education  and help integrate therapy concepts, techniques,education, etc. 7. PT will assess and treat for/with: Lower extremity strength, range of motion, stamina, balance, functional mobility, safety, adaptive techniques and equipment, visual spatial awareness, proprioceptive rx, stroke education, family education.   Goals are: mod I to supervision. 8. OT will assess and treat for/with: ADL's, functional mobility, safety, upper extremity strength, adaptive techniques and equipment, NMR, visual spatial awareness, community  reintegration, family education.   Goals are: mod I to supervision. Therapy may proceed with showering this patient. 9. SLP will assess and treat for/with: speech, communication.  Goals are: mod I. 10. Case Management and Social Worker will assess and treat for psychological issues and discharge planning. 11. Team conference will be held weekly to assess progress toward goals and to determine barriers   to discharge. 12. Patient will receive at least 3 hours of therapy per day at least 5 days per week. 13. ELOS: 7-9 days       14. Prognosis:  excellent     Zachary T. Swartz, MD, FAAPMR [facility] 07/13/2015   07/13/2015 

## 2015-07-13 NOTE — H&P (Signed)
  Physical Medicine and Rehabilitation Admission H&P    Chief Complaint  Patient presents with  . Eye Problem  : HPI: Ernest [name] is a 69 y.o. right handed male with history of end-stage renal disease with hemodialysis Monday Wednesday Friday at [facility], hypertension, seizure disorder maintained on Keppra, diabetes mellitus peripheral neuropathy, CAD with stenting and CVA with little residual weakness . Patient had been on aspirin and Plavix but ran out of aspirin was taking only Plavix. Presented 07/11/2015 with diplopia and slurred speech. Lives with spouse independent with single-point cane prior to admission. Wife is physically limited to provide assistance. Cranial CT scan with chronic ischemic white matter disease old lacunar infarct without acute intracranial abnormality. Echocardiogram with ejection fraction of 50% grade 1 diastolic dysfunction. Carotid Dopplers with no ICA stenosis. MRI of the brain not completed due to history of penile implant. Follow up cranial CT pending.. Neurology consulted presently remains on aspirin and Plavix. Subcutaneous heparin for DVT prophylaxis. Regular consistency diet. Physical and occupational therapy evaluation completed with recommendations of physical medicine rehabilitation consult. Patient was admitted for a comprehensive rehabilitation program  ROS Review of Systems  Constitutional: Negative for fever and chills.  HENT: Negative for hearing loss.  Eyes: Positive for blurred vision and double vision.  Respiratory: Negative for cough.   Shortness of breath on exertion.  Cardiovascular: Negative for chest pain, palpitations and leg swelling.  Gastrointestinal: Positive for constipation. Negative for nausea, vomiting and abdominal pain.  Genitourinary: Negative for dysuria and hematuria.  Musculoskeletal: Positive for myalgias and joint pain.  Skin: Negative for rash.  Neurological: Positive for weakness. Positive   for seizures, loss of consciousness and headaches    Past Medical History  Diagnosis Date  . Hyperlipidemia   . Retinopathy   . Gout   . ESRD (end stage renal disease)     Started HD in [place] in 2009, ESRD was due to DM. Moved to [place] in Dec 2009 and now gets dialysis at [facility] on a MWF schedule.     . Erectile dysfunction     penile implant  . Abnormal stress test     s/p cath November 2013 with modest disease involving the ostial left main, proximal LAD, proximal RCA - do not appear to be hemodynamically signficant; mild LV dysfunction  . Obesity   . Hypertension   . Hepatitis C   . COPD (chronic obstructive pulmonary disease)   . Coronary artery disease     per cath report 2013  . Ejection fraction < 50%   . Chronic systolic CHF (congestive heart failure)   . Bacteremia   . Shortness of breath     Hx: of with exertion  . Gout     Hx: of  . Stroke   . Type II or unspecified type diabetes mellitus without mention of complication, not stated as uncontrolled     adult onset   Past Surgical History  Procedure Laterality Date  . Penile prosthesis implant      1997.. no card  . Fistula      RUE and wrist  . Cardiac catheterization  August 26, 2012  . Eye surgery      laser.  and surgery for DM  . Insertion of dialysis catheter Right 01/01/2013    Procedure: INSERTION OF DIALYSIS CATHETER right  internal jugular;  Surgeon: Vance W Brabham, MD;  Location: [facility];  Service: Vascular;  Laterality: Right;  . Av   fistula placement Left 01/13/2013    Procedure: INSERTION OF ARTERIOVENOUS (AV) GORE-TEX GRAFT ARM;  Surgeon: Angelia Mould, MD;  Location: [place];  Service: Vascular;  Laterality: Left;  . Thrombectomy w/ embolectomy Left 03/26/2013    Procedure: Attempted thrombectomy of left arm arteriovenous goretex graft.;  Surgeon: Angelia Mould, MD;  Location: [place];  Service: Vascular;  Laterality: Left;  . Insertion of dialysis catheter N/A 03/26/2013     Procedure: INSERTION OF DIALYSIS CATHETER Left internal jugular vein;  Surgeon: Angelia Mould, MD;  Location: [place];  Service: Vascular;  Laterality: N/A;  . Avgg removal Left 03/26/2013    Procedure: REMOVAL OF  NON INCORPORATED ARTERIOVENOUS GORETEX GRAFT ([place]) left arm * repair of  left brachial artery with vein patch angioplasty.;  Surgeon: Angelia Mould, MD;  Location: [place];  Service: Vascular;  Laterality: Left;  . Av fistula placement Left 05/05/2013    Procedure: INSERTION OF LEFT UPPER ARM  ARTERIOVENOUS GORTEX GRAFT;  Surgeon: Angelia Mould, MD;  Location: [place];  Service: Vascular;  Laterality: Left;  . Tee without cardioversion N/A 06/11/2013    Procedure: TRANSESOPHAGEAL ECHOCARDIOGRAM (TEE);  Surgeon: Larey Dresser, MD;  Location: [place];  Service: Cardiovascular;  Laterality: N/A;  . Embolectomy Right 08/27/2013    Procedure: EMBOLECTOMY;  Surgeon: Mal Misty, MD;  Location: [place];  Service: Vascular;  Laterality: Right;  Thrombectomy of Radial and ulnar artery.  . Cataract extraction w/ intraocular lens  implant, bilateral    . Colonoscopy    . Ligation of arteriovenous  fistula Right 12/30/2013    Procedure: REMOVAL OF SEGMENT OF GORTEX GRAFT AND FISTULA  AND REPAIR OF BRACHIAL ARTERY;  Surgeon: Mal Misty, MD;  Location: [place];  Service: Vascular;  Laterality: Right;  Annell Greening N/A 04/07/2013    Procedure: VENOGRAM;  Surgeon: Serafina Mitchell, MD;  Location: [facility] CATH LAB;  Service: Cardiovascular;  Laterality: N/A;   Family History  Problem Relation Age of Onset  . Heart disease Father   . Diabetes Sister   . Diabetes Brother   . Diabetes Son    Social History:  reports that he quit smoking about 42 years ago. He has never used smokeless tobacco. He reports that he does not drink alcohol or use illicit drugs. Allergies: [name] Medications Prior to Admission  Medication Sig Dispense Refill  . allopurinol (ZYLOPRIM) 100 MG  tablet Take 100 mg by mouth daily.     [name] amitriptyline (ELAVIL) 25 MG tablet TAKE ONE TABLET BY MOUTH AT BEDTIME FOR  NEUROPATHY (Patient taking differently: TAKE ONE TABLET BY MOUTH AT BEDTIME AS NEED  FOR  NEUROPATHY) 90 tablet 0  . aspirin EC 81 MG tablet Take 81 mg by mouth daily.    . Calcium Acetate 667 MG TABS Take 2,001 tablets by mouth 3 (three) times daily with meals.     . cinacalcet (SENSIPAR) 60 MG tablet Take 60 mg by mouth 2 (two) times daily.     . clopidogrel (PLAVIX) 75 MG tablet Take 75 mg by mouth daily.     . colchicine 0.6 MG tablet Take 0.6 mg by mouth daily.     . insulin NPH Human (HUMULIN N,NOVOLIN N) 100 UNIT/ML injection Inject 0.1 mLs (10 Units total) into the skin 2 (two) times daily. 10 mL 11  . levETIRAcetam (KEPPRA) 500 MG tablet Take 2 tablets (1,000 mg total) by mouth daily. Then take extra 500 mg (  1 tablet) on MWF after HD 72 tablet 5  . lidocaine-prilocaine (EMLA) cream Apply 1 application topically as needed (dialysis).    [name] lisinopril (PRINIVIL,ZESTRIL) 20 MG tablet Take 20 mg by mouth daily.    . metoprolol (LOPRESSOR) 50 MG tablet Take 50 mg by mouth daily.     . multivitamin (RENA-VIT) TABS tablet Take 1 tablet by mouth daily.     . sevelamer (RENVELA) 800 MG tablet Take 800-3,200 mg by mouth 5 (five) times daily. 4 tablets three times daily with meals and 1 tablet twice daily with snacks.    . simvastatin (ZOCOR) 20 MG tablet Take 20 mg by mouth daily at 6 PM.       Home: Home Living Family/patient expects to be discharged to:: Private residence Living Arrangements: Spouse/significant other Available Help at Discharge: Family Type of Home: House Home Access: Stairs to enter Technical brewer of Steps: 1 Home Layout: Two level, 1/2 bath on main level Alternate Level Stairs-Number of Steps: 14 Alternate Level Stairs-Rails: Left Bathroom Shower/Tub: Tub/shower unit (garden tub) Biochemist, clinical: Standard Home Equipment: Environmental consultant - 2 wheels, Radio producer  - single point, Civil engineer, contracting  Lives With: Spouse, Daughter   Functional History: Prior Function Level of Independence: Independent Comments: pt was bathing, dressing, ambulating without AD. Wife has a bad back and cannot physically assist pt, wife drives.  Functional Status:  Mobility: Bed Mobility Overal bed mobility: Needs Assistance Bed Mobility: Supine to Sit Supine to sit: Supervision General bed mobility comments: pt in chair Transfers Overall transfer level: Needs assistance Equipment used: Rolling walker (2 wheeled) Transfers: Sit to/from Stand Sit to Stand: Min assist General transfer comment: posterior bias initially, min assist for balance, verbal cues for hand placement Ambulation/Gait Ambulation/Gait assistance: Min assist, Mod assist Ambulation Distance (Feet): 120 Feet Assistive device: Rolling walker (2 wheeled), None Gait Pattern/deviations: Step-through pattern, Wide base of support General Gait Details: attempted to ambulate without AD however pt required modA to maintain balance and was reaching for hallway rail. pt with noted difficulty with spatial awareness, likely due to vision deficits. once given RW pt with increased stabilty however still required minA during turning for safety Gait velocity: slow    ADL: ADL Overall ADL's : Needs assistance/impaired Eating/Feeding Details (indicate cue type and reason): awaiting meal tray Grooming: Wash/dry hands, Wash/dry face, Standing, Minimal assistance Grooming Details (indicate cue type and reason): cues for locating paper towels, soap  Upper Body Bathing: Minimal assitance, Sitting Upper Body Bathing Details (indicate cue type and reason): assisted with back Lower Body Bathing: Minimal assistance, Sit to/from stand Upper Body Dressing : Minimal assistance, Sitting Upper Body Dressing Details (indicate cue type and reason): assist to orient Lower Body Dressing: Minimal assistance, Sit to/from stand Lower Body  Dressing Details (indicate cue type and reason): able to don and doff socks, steadying assist needed to pull up pants Toilet Transfer: Minimal assistance, RW, Ambulation, Regular Toilet Toileting- Clothing Manipulation and Hygiene: Minimal assistance, Sit to/from stand Functional mobility during ADLs: Minimal assistance, Rolling walker  Cognition: Cognition Overall Cognitive Status: History of cognitive impairments - at baseline (6th grade education, wife states he is at baseline) Arousal/Alertness: Awake/alert Orientation Level: Oriented X4 Memory: Impaired Memory Impairment: Storage deficit, Retrieval deficit, Decreased recall of new information (pt did not recall having CT scan ) Awareness: Appears intact (aware to visual deficits) Problem Solving: Appears intact (for basic to call for assist and not try to get OOB on his own) Cognition Arousal/Alertness: Awake/alert Behavior During Therapy:  WFL for tasks assessed/performed Overall Cognitive Status: History of cognitive impairments - at baseline (6th grade education, wife states he is at baseline)  Physical Exam: Blood pressure 185/62, pulse 64, temperature 98.5 F (36.9 C), temperature source Oral, resp. rate 16, height 6' (1.829 m), weight 102.059 kg (225 lb), SpO2 94 %. Physical Exam Constitutional: He appears well-developed and well-nourished.  HENT: oral mucosa pink and moist.  Head: Normocephalic and atraumatic.  Eyes: No scleral icterus.  Pupils reactive to light without nystagmus  Neck: Normal range of motion. Neck supple. No JVD present. No tracheal deviation present. No thyromegaly present.  Cardiovascular: Normal rate and regular rhythm. no murmurs or rubs Respiratory: Effort normal and breath sounds normal. No respiratory distress. No wheezes or rales GI: Soft. Bowel sounds are normal. He exhibits no distension.  Neurological: He is alert.  Has difficulty tracking to the left. Left CN6 palsy. Struggles tracking to  midline but is able to accomplish inconsistently. Appears to track fairly easily in other directions. Past points with both hands, left more than right. ?ataxic. No PD. Speech is a bit dysarthric but intelligible. Fair awareness of deficits. Follows commands. MMT: 4+ to 5/5 bilateral UE delt,bicep,tricep, wrist, fingers LE: 4/5 HF, KE and ADF/APF bilaterally. Decreased LT in both feet in stocking glove distribution. Fair insight and awareness.   Skin: Skin is warm and dry.  Psychiatric: He has a normal mood and affect. His behavior is normal    Results for orders placed or performed during the hospital encounter of 07/11/15 (from the past 48 hour(s))  Protime-INR     Status: None   Collection Time: 07/11/15  1:57 PM  Result Value Ref Range   Prothrombin Time 13.2 11.6 - 15.2 seconds   INR 0.98 0.00 - 1.49  APTT     Status: Abnormal   Collection Time: 07/11/15  1:57 PM  Result Value Ref Range   aPTT 20 (L) 24 - 37 seconds  CBC     Status: Abnormal   Collection Time: 07/11/15  1:57 PM  Result Value Ref Range   WBC 6.6 4.0 - 10.5 K/uL   RBC 4.53 4.22 - 5.81 MIL/uL   Hemoglobin 13.1 13.0 - 17.0 g/dL   HCT 38.5 (L) 39.0 - 52.0 %   MCV 85.0 78.0 - 100.0 fL   MCH 28.9 26.0 - 34.0 pg   MCHC 34.0 30.0 - 36.0 g/dL   RDW 14.9 11.5 - 15.5 %   Platelets  150 - 400 K/uL    PLATELET CLUMPS NOTED ON SMEAR, COUNT APPEARS ADEQUATE  Differential     Status: None   Collection Time: 07/11/15  1:57 PM  Result Value Ref Range   Neutrophils Relative % 64 %   Lymphocytes Relative 22 %   Monocytes Relative 10 %   Eosinophils Relative 3 %   Basophils Relative 1 %   Neutro Abs 4.1 1.7 - 7.7 K/uL   Lymphs Abs 1.5 0.7 - 4.0 K/uL   Monocytes Absolute 0.7 0.1 - 1.0 K/uL   Eosinophils Absolute 0.2 0.0 - 0.7 K/uL   Basophils Absolute 0.1 0.0 - 0.1 K/uL   Smear Review FIBRIN STRANDS NOTED   Ethanol     Status: None   Collection Time: 07/11/15  2:00 PM  Result Value Ref Range   Alcohol, Ethyl (B) <5 <5  mg/dL    Comment:        LOWEST DETECTABLE LIMIT FOR SERUM ALCOHOL IS 5 mg/dL FOR MEDICAL PURPOSES ONLY  I-stat troponin, ED (not at [facility], [facility])     Status: None   Collection Time: 07/11/15  2:00 PM  Result Value Ref Range   Troponin i, poc 0.02 0.00 - 0.08 ng/mL   Comment 3            Comment: Due to the release kinetics of cTnI, a negative result within the first hours of the onset of symptoms does not rule out myocardial infarction with certainty. If myocardial infarction is still suspected, repeat the test at appropriate intervals.   Comprehensive metabolic panel     Status: Abnormal   Collection Time: 07/11/15  2:00 PM  Result Value Ref Range   Sodium 142 135 - 145 mmol/L   Potassium 3.2 (L) 3.5 - 5.1 mmol/L   Chloride 95 (L) 101 - 111 mmol/L   CO2 37 (H) 22 - 32 mmol/L   Glucose, Bld 100 (H) 65 - 99 mg/dL   BUN 14 6 - 20 mg/dL   Creatinine, Ser 5.63 (H) 0.61 - 1.24 mg/dL   Calcium 8.6 (L) 8.9 - 10.3 mg/dL   Total Protein 6.5 6.5 - 8.1 g/dL   Albumin 3.4 (L) 3.5 - 5.0 g/dL   AST 19 15 - 41 U/L   ALT 16 (L) 17 - 63 U/L   Alkaline Phosphatase 54 38 - 126 U/L   Total Bilirubin 0.4 0.3 - 1.2 mg/dL   GFR calc non Af Amer 9 (L) >60 mL/min   GFR calc Af Amer 11 (L) >60 mL/min    Comment: (NOTE) The eGFR has been calculated using the CKD EPI equation. This calculation has not been validated in all clinical situations. eGFR's persistently <60 mL/min signify possible Chronic Kidney Disease.    Anion gap 10 5 - 15  I-Stat Chem 8, ED  (not at [facility], [facility])     Status: Abnormal   Collection Time: 07/11/15  2:02 PM  Result Value Ref Range   Sodium 140 135 - 145 mmol/L   Potassium 3.9 3.5 - 5.1 mmol/L   Chloride 93 (L) 101 - 111 mmol/L   BUN 22 (H) 6 - 20 mg/dL   Creatinine, Ser 5.10 (H) 0.61 - 1.24 mg/dL   Glucose, Bld 101 (H) 65 - 99 mg/dL   Calcium, Ion 0.96 (L) 1.13 - 1.30 mmol/L   TCO2 37 0 - 100 mmol/L   Hemoglobin 13.6 13.0 - 17.0 g/dL   HCT 40.0 39.0 - 52.0 %    Glucose, capillary     Status: None   Collection Time: 07/11/15  6:24 PM  Result Value Ref Range   Glucose-Capillary 72 65 - 99 mg/dL   Comment 1 Notify RN    Comment 2 Document in Chart   Glucose, capillary     Status: None   Collection Time: 07/11/15  9:03 PM  Result Value Ref Range   Glucose-Capillary 74 65 - 99 mg/dL   Comment 1 Notify RN    Comment 2 Document in Chart   Glucose, capillary     Status: Abnormal   Collection Time: 07/12/15 12:11 AM  Result Value Ref Range   Glucose-Capillary 56 (L) 65 - 99 mg/dL   Comment 1 Notify RN    Comment 2 Document in Chart   Glucose, capillary     Status: Abnormal   Collection Time: 07/12/15 12:40 AM  Result Value Ref Range   Glucose-Capillary 112 (H) 65 - 99 mg/dL  Glucose, capillary     Status: Abnormal   Collection   Time: 07/12/15  4:20 AM  Result Value Ref Range   Glucose-Capillary 52 (L) 65 - 99 mg/dL   Comment 1 Notify RN    Comment 2 Document in Chart   Glucose, capillary     Status: Abnormal   Collection Time: 07/12/15  4:47 AM  Result Value Ref Range   Glucose-Capillary 132 (H) 65 - 99 mg/dL  Hemoglobin A1c     Status: Abnormal   Collection Time: 07/12/15  5:35 AM  Result Value Ref Range   Hgb A1c MFr Bld 7.7 (H) 4.8 - 5.6 %    Comment: (NOTE)         Pre-diabetes: 5.7 - 6.4         Diabetes: >6.4         Glycemic control for adults with diabetes: <7.0    Mean Plasma Glucose 174 mg/dL    Comment: (NOTE) Performed At: [facility] [place] [address] [place], [place] [id number] H[place] William F MD Ph:[phone]   TSH     Status: None   Collection Time: 07/12/15  5:35 AM  Result Value Ref Range   TSH 1.064 0.350 - 4.500 uIU/mL  HIV antibody     Status: None   Collection Time: 07/12/15  5:35 AM  Result Value Ref Range   HIV Screen 4th Generation wRfx Non Reactive Non Reactive    Comment: (NOTE) Performed At: [facility] [place] [address] [place], [place] [id number] H[place] William F MD Ph:[phone]    Renal function panel     Status: Abnormal   Collection Time: 07/12/15  5:35 AM  Result Value Ref Range   Sodium 139 135 - 145 mmol/L   Potassium 3.2 (L) 3.5 - 5.1 mmol/L    Comment: DELTA CHECK NOTED   Chloride 95 (L) 101 - 111 mmol/L   CO2 33 (H) 22 - 32 mmol/L   Glucose, Bld 115 (H) 65 - 99 mg/dL   BUN 18 6 - 20 mg/dL   Creatinine, Ser 7.49 (H) 0.61 - 1.24 mg/dL   Calcium 8.4 (L) 8.9 - 10.3 mg/dL   Phosphorus 4.3 2.5 - 4.6 mg/dL   Albumin 3.2 (L) 3.5 - 5.0 g/dL   GFR calc non Af Amer 7 (L) >60 mL/min   GFR calc Af Amer 8 (L) >60 mL/min    Comment: (NOTE) The eGFR has been calculated using the CKD EPI equation. This calculation has not been validated in all clinical situations. eGFR's persistently <60 mL/min signify possible Chronic Kidney Disease.    Anion gap 11 5 - 15  CBC     Status: Abnormal   Collection Time: 07/12/15  5:35 AM  Result Value Ref Range   WBC 6.5 4.0 - 10.5 K/uL   RBC 4.21 (L) 4.22 - 5.81 MIL/uL   Hemoglobin 12.1 (L) 13.0 - 17.0 g/dL   HCT 36.3 (L) 39.0 - 52.0 %   MCV 86.2 78.0 - 100.0 fL   MCH 28.7 26.0 - 34.0 pg   MCHC 33.3 30.0 - 36.0 g/dL   RDW 14.8 11.5 - 15.5 %   Platelets 156 150 - 400 K/uL  Lipid panel     Status: None   Collection Time: 07/12/15  5:35 AM  Result Value Ref Range   Cholesterol 96 0 - 200 mg/dL   Triglycerides 79 <150 mg/dL   HDL 44 >40 mg/dL   Total CHOL/HDL Ratio 2.2 RATIO   VLDL 16 0 - 40 mg/dL   LDL Cholesterol 36 0 - 99   mg/dL    Comment:        Total Cholesterol/HDL:CHD Risk Coronary Heart Disease Risk Table                     Men   Women  1/2 Average Risk   3.4   3.3  Average Risk       5.0   4.4  2 X Average Risk   9.6   7.1  3 X Average Risk  23.4   11.0        Use the calculated Patient Ratio above and the CHD Risk Table to determine the patient's CHD Risk.        ATP III CLASSIFICATION (LDL):  <100     mg/dL   Optimal  100-129  mg/dL   Near or Above                    Optimal  130-159  mg/dL    Borderline  160-189  mg/dL   High  >190     mg/dL   Very High   Glucose, capillary     Status: None   Collection Time: 07/12/15  7:39 AM  Result Value Ref Range   Glucose-Capillary 73 65 - 99 mg/dL   Comment 1 Notify RN    Comment 2 Document in Chart   Glucose, capillary     Status: Abnormal   Collection Time: 07/12/15 11:29 AM  Result Value Ref Range   Glucose-Capillary 163 (H) 65 - 99 mg/dL  Glucose, capillary     Status: Abnormal   Collection Time: 07/12/15  2:01 PM  Result Value Ref Range   Glucose-Capillary 147 (H) 65 - 99 mg/dL  Glucose, capillary     Status: Abnormal   Collection Time: 07/12/15  4:22 PM  Result Value Ref Range   Glucose-Capillary 108 (H) 65 - 99 mg/dL   Comment 1 Notify RN    Comment 2 Document in Chart   Glucose, capillary     Status: Abnormal   Collection Time: 07/12/15  8:36 PM  Result Value Ref Range   Glucose-Capillary 192 (H) 65 - 99 mg/dL   Comment 1 Notify RN    Comment 2 Document in Chart   Glucose, capillary     Status: Abnormal   Collection Time: 07/13/15 12:04 AM  Result Value Ref Range   Glucose-Capillary 146 (H) 65 - 99 mg/dL   Comment 1 Notify RN    Comment 2 Document in Chart   Glucose, capillary     Status: None   Collection Time: 07/13/15  4:06 AM  Result Value Ref Range   Glucose-Capillary 82 65 - 99 mg/dL   Comment 1 Notify RN    Comment 2 Document in Chart    Dg Chest 2 View  07/11/2015   CLINICAL DATA:  Left eye vision changes today. Hemodialysis patient. Initial encounter.  EXAM: CHEST  2 VIEW  COMPARISON:  04/29/2015 and 12/05/2014.  FINDINGS: The heart size and mediastinal contours are stable. Right axillary vascular stent noted. The overall basilar aeration has improved. There is no confluent airspace opacity, edema or significant pleural effusion. The bones appear unchanged.  IMPRESSION: No acute cardiopulmonary process.   Electronically Signed   By: William  Veazey M.D.   On: 07/11/2015 16:57   Ct Head Wo  Contrast  07/11/2015   CLINICAL DATA:  Intracranial hemorrhage. LEFT pupil nonreactive. Stroke with speech changes.  EXAM: CT HEAD WITHOUT CONTRAST  TECHNIQUE:   Contiguous axial images were obtained from the base of the skull through the vertex without intravenous contrast.  COMPARISON:  04/25/2015.  FINDINGS: No mass lesion, mass effect, midline shift, hydrocephalus, hemorrhage. No acute territorial cortical ischemia/infarct. Atrophy and chronic ischemic white matter disease is present. Benign basal ganglia calcifications along with scattered parenchymal calcifications. These are chronic findings and appears similar to the prior exam. Old lacunar infarct in the RIGHT cerebellar hemisphere. Visible paranasal sinuses appear normal. Dense intracranial atherosclerosis.  IMPRESSION: Atrophy, chronic ischemic white matter disease and old lacunar infarcts without acute intracranial abnormality. No interval change from 04/25/2015.   Electronically Signed   By: Dereck Ligas M.D.   On: 07/11/2015 13:49       Medical Problem List and Plan: 1. Functional deficits secondary to pontine infarct with diplopia, speech, and balance deficits 2.  DVT Prophylaxis/Anticoagulation: Subcutaneous heparin. Monitor platelet counts and any signs of bleeding 3. Pain Management: Tylenol as needed 4. End-stage renal disease. Continue hemodialysis as per renal services. HD will be after therapies to avoid conflict with therapy day 5. Neuropsych: This patient is capable of making decisions on his own behalf. 6. Skin/Wound Care: Routine skin checks. Encourage nutrition 7. Fluids/Electrolytes/Nutrition: Routine I&O with follow-up chemistries upon admission 8. Seizure disorder. Keppra 1000 mg daily. Takes extra 500 mg tablet Monday Wednesday Friday after dialysis. Monitor for any seizure activity 9. Diabetes mellitus of peripheral neuropathy. Hemoglobin A1c 7.7. Insulin NPH 8 units twice a day. Check blood sugars before meals and at  bedtime 10. History of gout. Continue Zyloprim daily, colchicine 0.6 mg daily. Monitor for any gout flareups 11. Hyperlipidemia. Zocor  Post Admission Physician Evaluation: 1. Functional deficits secondary  to pontine infarct. 2. Patient is admitted to receive collaborative, interdisciplinary care between the physiatrist, rehab nursing staff, and therapy team. 3. Patient's level of medical complexity and substantial therapy needs in context of that medical necessity cannot be provided at a lesser intensity of care such as a SNF. 4. Patient has experienced substantial functional loss from his/her baseline which was documented above under the "Functional History" and "Functional Status" headings.  Judging by the patient's diagnosis, physical exam, and functional history, the patient has potential for functional progress which will result in measurable gains while on inpatient rehab.  These gains will be of substantial and practical use upon discharge  in facilitating mobility and self-care at the household level. 5. Physiatrist will provide 24 hour management of medical needs as well as oversight of the therapy plan/treatment and provide guidance as appropriate regarding the interaction of the two. 6. 24 hour rehab nursing will assist with bladder management, bowel management, safety, skin/wound care, disease management, medication administration, pain management and patient education  and help integrate therapy concepts, techniques,education, etc. 7. PT will assess and treat for/with: Lower extremity strength, range of motion, stamina, balance, functional mobility, safety, adaptive techniques and equipment, visual spatial awareness, proprioceptive rx, stroke education, family education.   Goals are: mod I to supervision. 8. OT will assess and treat for/with: ADL's, functional mobility, safety, upper extremity strength, adaptive techniques and equipment, NMR, visual spatial awareness, community  reintegration, family education.   Goals are: mod I to supervision. Therapy may proceed with showering this patient. 9. SLP will assess and treat for/with: speech, communication.  Goals are: mod I. 10. Case Management and Social Worker will assess and treat for psychological issues and discharge planning. 11. Team conference will be held weekly to assess progress toward goals and to determine barriers  to discharge. 12. Patient will receive at least 3 hours of therapy per day at least 5 days per week. 13. ELOS: 7-9 days       14. Prognosis:  excellent     Zachary T. Swartz, MD, FAAPMR [facility] 07/13/2015   07/13/2015 

## 2015-07-13 NOTE — Progress Notes (Signed)
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 right handed male with history of end-stage renal disease with hemodialysis, hypertension, diabetes mellitus peripheral neuropathy, CAD with stenting and CVA with little residual weakness . Patient had been on aspirin and Plavix but ran out of aspirin was taking only Plavix. Presented 07/11/2015 with diplopia and slurred speech. Lives with spouse independent with single-point cane prior to admission. Wife is physically limited to provide assistance. Cranial CT scan with chronic ischemic white matter disease old lacunar infarct without acute intracranial abnormality. Echocardiogram is pending. Carotid Dopplers with no ICA stenosis. MRI of the brain not completed due to history of penile implant. CT angiogram head and neck are pending. Neurology consulted presently remains on aspirin and Plavix. Subcutaneous heparin for DVT prophylaxis. Regular consistency diet. Physical and occupational therapy evaluation completed with recommendations of physical medicine rehabilitation consult.   Review of Systems  Constitutional: Negative for fever and chills.  HENT: Negative for hearing loss.  Eyes: Positive for blurred vision and double vision.  Respiratory: Negative for cough.   Shortness of breath on exertion.  Cardiovascular: Negative for chest pain, palpitations and leg swelling.  Gastrointestinal: Positive for constipation. Negative for nausea, vomiting and abdominal pain.  Genitourinary: Negative for dysuria and hematuria.  Musculoskeletal: Positive for myalgias and  joint pain.  Skin: Negative for rash.  Neurological: Positive for weakness. Negative for seizures, loss of consciousness and headaches.   Past Medical History  Diagnosis Date  . Hyperlipidemia   . Retinopathy   . Gout   . ESRD (end stage renal disease)     Started HD in [place] in 2009, ESRD was due to DM. Moved to [facility] in Dec 2009 and now gets dialysis at [facility] on a MWF schedule.   . Type II or unspecified type diabetes mellitus without mention of complication, not stated as uncontrolled     adult onset  . Erectile dysfunction     penile implant  . Abnormal stress test     s/p cath November 2013 with modest disease involving the ostial left main, proximal LAD, proximal RCA - do not appear to be hemodynamically signficant; mild LV dysfunction  . Obesity   . Hypertension   . Hepatitis C   . COPD (chronic obstructive pulmonary disease)   . Coronary artery disease     per cath report 2013  . Ejection fraction < 50%   . Chronic systolic CHF (congestive heart failure)   . Bacteremia   . Shortness of breath     Hx: of with exertion  . Gout     Hx: of  . Stroke    Past Surgical History  Procedure Laterality Date  . Penile prosthesis implant      1997.. no card  . Fistula      RUE and wrist  . Cardiac catheterization  August 26, 2012  . Eye surgery      laser. and surgery for DM  . Insertion of dialysis catheter Right 01/01/2013    Procedure: INSERTION OF DIALYSIS CATHETER right internal jugular; Surgeon: Serafina Mitchell, MD; Location: [place];  Service: Vascular; Laterality: Right;  . Av fistula placement Left 01/13/2013    Procedure: INSERTION OF ARTERIOVENOUS (AV) GORE-TEX GRAFT ARM; Surgeon: Angelia Mould, MD; Location: [place]; Service: Vascular; Laterality: Left;  . Thrombectomy w/ embolectomy Left 03/26/2013    Procedure:  Attempted thrombectomy of left arm arteriovenous goretex graft.; Surgeon: Angelia Mould, MD; Location: [place]; Service: Vascular; Laterality: Left;  . Insertion of dialysis catheter N/A 03/26/2013    Procedure: INSERTION OF DIALYSIS CATHETER Left internal jugular vein; Surgeon: Angelia Mould, MD; Location: [place]; Service: Vascular; Laterality: N/A;  . Avgg removal Left 03/26/2013    Procedure: REMOVAL OF NON INCORPORATED ARTERIOVENOUS GORETEX GRAFT ([place]) left arm * repair of left brachial artery with vein patch angioplasty.; Surgeon: Angelia Mould, MD; Location: [place]; Service: Vascular; Laterality: Left;  . Av fistula placement Left 05/05/2013    Procedure: INSERTION OF LEFT UPPER ARM ARTERIOVENOUS GORTEX GRAFT; Surgeon: Angelia Mould, MD; Location: [place]; Service: Vascular; Laterality: Left;  . Tee without cardioversion N/A 06/11/2013    Procedure: TRANSESOPHAGEAL ECHOCARDIOGRAM (TEE); Surgeon: Larey Dresser, MD; Location: [place]; Service: Cardiovascular; Laterality: N/A;  . Embolectomy Right 08/27/2013    Procedure: EMBOLECTOMY; Surgeon: Mal Misty, MD; Location: [place]; Service: Vascular; Laterality: Right; Thrombectomy of Radial and ulnar artery.  . Cataract extraction w/ intraocular lens implant, bilateral    . Colonoscopy    . Ligation of arteriovenous fistula Right 12/30/2013    Procedure: REMOVAL OF SEGMENT OF GORTEX GRAFT AND FISTULA AND REPAIR OF BRACHIAL ARTERY; Surgeon: Mal Misty, MD; Location: [place]; Service: Vascular; Laterality: Right;  Annell Greening N/A 04/07/2013    Procedure: VENOGRAM; Surgeon: Serafina Mitchell, MD; Location: [facility]; Service: Cardiovascular; Laterality: N/A;   Family History  Problem Relation Age of Onset  . Heart disease Father   . Diabetes Sister   . Diabetes Brother   . Diabetes Son    Social  History:  reports that he quit smoking about 42 years ago. He has never used smokeless tobacco. He reports that he does not drink alcohol or use illicit drugs. Allergies: [name] Medications Prior to Admission  Medication Sig Dispense Refill  . allopurinol (ZYLOPRIM) 100 MG tablet Take 100 mg by mouth daily.     [name] amitriptyline (ELAVIL) 25 MG tablet TAKE ONE TABLET BY MOUTH AT BEDTIME FOR NEUROPATHY (Patient taking differently: TAKE ONE TABLET BY MOUTH AT BEDTIME AS NEED FOR NEUROPATHY) 90 tablet 0  . aspirin EC 81 MG tablet Take 81 mg by mouth daily.    . Calcium Acetate 667 MG TABS Take 2,001 tablets by mouth 3 (three) times daily with meals.     . cinacalcet (SENSIPAR) 60 MG tablet Take 60 mg by mouth 2 (two) times daily.     . clopidogrel (PLAVIX) 75 MG tablet Take 75 mg by mouth daily.     . colchicine 0.6 MG tablet Take 0.6 mg by mouth daily.     . insulin NPH Human (HUMULIN N,NOVOLIN N) 100 UNIT/ML injection Inject 0.1 mLs (10 Units total) into the skin 2 (two) times daily. 10 mL 11  . levETIRAcetam (KEPPRA) 500 MG tablet Take 2 tablets (1,000 mg total) by mouth daily. Then take extra 500 mg (1 tablet) on MWF after HD 72 tablet 5  . lidocaine-prilocaine (EMLA) cream Apply 1 application topically as needed (dialysis).    [name] lisinopril (PRINIVIL,ZESTRIL) 20 MG tablet Take 20 mg by mouth daily.    [name]  metoprolol (LOPRESSOR) 50 MG tablet Take 50 mg by mouth daily.     . multivitamin (RENA-VIT) TABS tablet Take 1 tablet by mouth daily.     . sevelamer (RENVELA) 800 MG tablet Take 800-3,200 mg by mouth 5 (five) times daily. 4 tablets three times daily with meals and 1 tablet twice daily with snacks.    . simvastatin (ZOCOR) 20 MG tablet Take 20 mg by mouth daily at 6 PM.       Home: Home Living Family/patient expects to be discharged to:: Private residence Living Arrangements: Spouse/significant  other Available Help at Discharge: Family Type of Home: House Home Access: Stairs to enter Technical brewer of Steps: 1 Home Layout: Two level, 1/2 bath on main level Alternate Level Stairs-Number of Steps: 14 Alternate Level Stairs-Rails: Left Bathroom Shower/Tub: Tub/shower unit (garden tub) Biochemist, clinical: Standard Home Equipment: Environmental consultant - 2 wheels, Radio producer - single point, Civil engineer, contracting Lives With: Spouse, Daughter  Functional History: Prior Function Level of Independence: Independent Comments: pt was bathing, dressing, ambulating without AD. Wife has a bad back and cannot physically assist pt, wife drives. Functional Status:  Mobility: Bed Mobility Overal bed mobility: Needs Assistance Bed Mobility: Supine to Sit Supine to sit: Supervision General bed mobility comments: pt in chair Transfers Overall transfer level: Needs assistance Equipment used: Rolling walker (2 wheeled) Transfers: Sit to/from Stand Sit to Stand: Min assist General transfer comment: posterior bias initially, min assist for balance, verbal cues for hand placement Ambulation/Gait Ambulation/Gait assistance: Min assist, Mod assist Ambulation Distance (Feet): 120 Feet Assistive device: Rolling walker (2 wheeled), None Gait Pattern/deviations: Step-through pattern, Wide base of support General Gait Details: attempted to ambulate without AD however pt required modA to maintain balance and was reaching for hallway rail. pt with noted difficulty with spatial awareness, likely due to vision deficits. once given RW pt with increased stabilty however still required minA during turning for safety Gait velocity: slow    ADL: ADL Overall ADL's : Needs assistance/impaired Eating/Feeding Details (indicate cue type and reason): awaiting meal tray Grooming: Wash/dry hands, Wash/dry face, Standing, Minimal assistance Grooming Details (indicate cue type and reason): cues for locating paper towels, soap  Upper  Body Bathing: Minimal assitance, Sitting Upper Body Bathing Details (indicate cue type and reason): assisted with back Lower Body Bathing: Minimal assistance, Sit to/from stand Upper Body Dressing : Minimal assistance, Sitting Upper Body Dressing Details (indicate cue type and reason): assist to orient Lower Body Dressing: Minimal assistance, Sit to/from stand Lower Body Dressing Details (indicate cue type and reason): able to don and doff socks, steadying assist needed to pull up pants Toilet Transfer: Minimal assistance, RW, Ambulation, Regular Toilet Toileting- Clothing Manipulation and Hygiene: Minimal assistance, Sit to/from stand Functional mobility during ADLs: Minimal assistance, Rolling walker  Cognition: Cognition Overall Cognitive Status: History of cognitive impairments - at baseline (6th grade education, wife states he is at baseline) Arousal/Alertness: Awake/alert Orientation Level: Oriented X4 Memory: Impaired Memory Impairment: Storage deficit, Retrieval deficit, Decreased recall of new information (pt did not recall having CT scan ) Awareness: Appears intact (aware to visual deficits) Problem Solving: Appears intact (for basic to call for assist and not try to get OOB on his own) Cognition Arousal/Alertness: Awake/alert Behavior During Therapy: WFL for tasks assessed/performed Overall Cognitive Status: History of cognitive impairments - at baseline (6th grade education, wife states he is at baseline)  Blood pressure 142/57, pulse 64, temperature 99.6 F (37.6 C), temperature source Oral, resp. rate 16, height 6' (1.829  m), weight 102.059 kg (225 lb), SpO2 97 %. Physical Exam  Constitutional: He appears well-developed and well-nourished.  HENT:  Head: Normocephalic and atraumatic.  Eyes: No scleral icterus.  Pupils reactive to light without nystagmus  Neck: Normal range of motion. Neck supple. No JVD present. No tracheal deviation present. No thyromegaly present.   Cardiovascular: Normal rate and regular rhythm.  Respiratory: Effort normal and breath sounds normal. No respiratory distress.  GI: Soft. Bowel sounds are normal. He exhibits no distension.  Neurological: He is alert.  Has difficulty tracking to the left. Left CN6 palsy. Appears to track in other directions. Past points with both hands, left more than right. ?ataxic. No PD. Speech is a bit dysarthric but intelligible. Fair awareness of deficits. Follows commands. MMT: 4+ to 5/5 bilateral UE. LE: 4/5 HF, KE and ADF/apf. Decreased LT in both feet.  Skin: Skin is warm and dry.  Psychiatric: He has a normal mood and affect. His behavior is normal.     Lab Results Last 24 Hours    Results for orders placed or performed during the hospital encounter of 07/11/15 (from the past 24 hour(s))  Protime-INR Status: None   Collection Time: 07/11/15 1:57 PM  Result Value Ref Range   Prothrombin Time 13.2 11.6 - 15.2 seconds   INR 0.98 0.00 - 1.49  APTT Status: Abnormal   Collection Time: 07/11/15 1:57 PM  Result Value Ref Range   aPTT 20 (L) 24 - 37 seconds  CBC Status: Abnormal   Collection Time: 07/11/15 1:57 PM  Result Value Ref Range   WBC 6.6 4.0 - 10.5 K/uL   RBC 4.53 4.22 - 5.81 MIL/uL   Hemoglobin 13.1 13.0 - 17.0 g/dL   HCT 38.5 (L) 39.0 - 52.0 %   MCV 85.0 78.0 - 100.0 fL   MCH 28.9 26.0 - 34.0 pg   MCHC 34.0 30.0 - 36.0 g/dL   RDW 14.9 11.5 - 15.5 %   Platelets  150 - 400 K/uL    PLATELET CLUMPS NOTED ON SMEAR, COUNT APPEARS ADEQUATE  Differential Status: None   Collection Time: 07/11/15 1:57 PM  Result Value Ref Range   Neutrophils Relative % 64 %   Lymphocytes Relative 22 %   Monocytes Relative 10 %   Eosinophils Relative 3 %   Basophils Relative 1 %   Neutro Abs 4.1 1.7 - 7.7 K/uL   Lymphs Abs 1.5 0.7 - 4.0 K/uL   Monocytes Absolute 0.7 0.1 -  1.0 K/uL   Eosinophils Absolute 0.2 0.0 - 0.7 K/uL   Basophils Absolute 0.1 0.0 - 0.1 K/uL   Smear Review FIBRIN STRANDS NOTED   Ethanol Status: None   Collection Time: 07/11/15 2:00 PM  Result Value Ref Range   Alcohol, Ethyl (B) <5 <5 mg/dL  I-stat troponin, ED (not at [facility], [facility]) Status: None   Collection Time: 07/11/15 2:00 PM  Result Value Ref Range   Troponin i, poc 0.02 0.00 - 0.08 ng/mL   Comment 3     Comprehensive metabolic panel Status: Abnormal   Collection Time: 07/11/15 2:00 PM  Result Value Ref Range   Sodium 142 135 - 145 mmol/L   Potassium 3.2 (L) 3.5 - 5.1 mmol/L   Chloride 95 (L) 101 - 111 mmol/L   CO2 37 (H) 22 - 32 mmol/L   Glucose, Bld 100 (H) 65 - 99 mg/dL   BUN 14 6 - 20 mg/dL   Creatinine, Ser 5.63 (H) 0.61 - 1.24 mg/dL  Calcium 8.6 (L) 8.9 - 10.3 mg/dL   Total Protein 6.5 6.5 - 8.1 g/dL   Albumin 3.4 (L) 3.5 - 5.0 g/dL   AST 19 15 - 41 U/L   ALT 16 (L) 17 - 63 U/L   Alkaline Phosphatase 54 38 - 126 U/L   Total Bilirubin 0.4 0.3 - 1.2 mg/dL   GFR calc non Af Amer 9 (L) >60 mL/min   GFR calc Af Amer 11 (L) >60 mL/min   Anion gap 10 5 - 15  I-Stat Chem 8, ED (not at [facility], [facility]) Status: Abnormal   Collection Time: 07/11/15 2:02 PM  Result Value Ref Range   Sodium 140 135 - 145 mmol/L   Potassium 3.9 3.5 - 5.1 mmol/L   Chloride 93 (L) 101 - 111 mmol/L   BUN 22 (H) 6 - 20 mg/dL   Creatinine, Ser 5.10 (H) 0.61 - 1.24 mg/dL   Glucose, Bld 101 (H) 65 - 99 mg/dL   Calcium, Ion 0.96 (L) 1.13 - 1.30 mmol/L   TCO2 37 0 - 100 mmol/L   Hemoglobin 13.6 13.0 - 17.0 g/dL   HCT 40.0 39.0 - 52.0 %  Glucose, capillary Status: None   Collection Time: 07/11/15 6:24 PM  Result Value Ref Range   Glucose-Capillary 72 65 - 99 mg/dL   Comment 1 Notify RN    Comment 2  Document in Chart   Glucose, capillary Status: None   Collection Time: 07/11/15 9:03 PM  Result Value Ref Range   Glucose-Capillary 74 65 - 99 mg/dL   Comment 1 Notify RN    Comment 2 Document in Chart   Glucose, capillary Status: Abnormal   Collection Time: 07/12/15 12:11 AM  Result Value Ref Range   Glucose-Capillary 56 (L) 65 - 99 mg/dL   Comment 1 Notify RN    Comment 2 Document in Chart   Glucose, capillary Status: Abnormal   Collection Time: 07/12/15 12:40 AM  Result Value Ref Range   Glucose-Capillary 112 (H) 65 - 99 mg/dL  Glucose, capillary Status: Abnormal   Collection Time: 07/12/15 4:20 AM  Result Value Ref Range   Glucose-Capillary 52 (L) 65 - 99 mg/dL   Comment 1 Notify RN    Comment 2 Document in Chart   Glucose, capillary Status: Abnormal   Collection Time: 07/12/15 4:47 AM  Result Value Ref Range   Glucose-Capillary 132 (H) 65 - 99 mg/dL  TSH Status: None   Collection Time: 07/12/15 5:35 AM  Result Value Ref Range   TSH 1.064 0.350 - 4.500 uIU/mL  Renal function panel Status: Abnormal   Collection Time: 07/12/15 5:35 AM  Result Value Ref Range   Sodium 139 135 - 145 mmol/L   Potassium 3.2 (L) 3.5 - 5.1 mmol/L   Chloride 95 (L) 101 - 111 mmol/L   CO2 33 (H) 22 - 32 mmol/L   Glucose, Bld 115 (H) 65 - 99 mg/dL   BUN 18 6 - 20 mg/dL   Creatinine, Ser 7.49 (H) 0.61 - 1.24 mg/dL   Calcium 8.4 (L) 8.9 - 10.3 mg/dL   Phosphorus 4.3 2.5 - 4.6 mg/dL   Albumin 3.2 (L) 3.5 - 5.0 g/dL   GFR calc non Af Amer 7 (L) >60 mL/min   GFR calc Af Amer 8 (L) >60 mL/min   Anion gap 11 5 - 15  CBC Status: Abnormal   Collection Time: 07/12/15 5:35 AM  Result Value Ref Range   WBC 6.5 4.0 - 10.5 K/uL  RBC 4.21 (L) 4.22 - 5.81 MIL/uL   Hemoglobin 12.1 (L) 13.0 - 17.0 g/dL    HCT 36.3 (L) 39.0 - 52.0 %   MCV 86.2 78.0 - 100.0 fL   MCH 28.7 26.0 - 34.0 pg   MCHC 33.3 30.0 - 36.0 g/dL   RDW 14.8 11.5 - 15.5 %   Platelets 156 150 - 400 K/uL  Lipid panel Status: None   Collection Time: 07/12/15 5:35 AM  Result Value Ref Range   Cholesterol 96 0 - 200 mg/dL   Triglycerides 79 <150 mg/dL   HDL 44 >40 mg/dL   Total CHOL/HDL Ratio 2.2 RATIO   VLDL 16 0 - 40 mg/dL   LDL Cholesterol 36 0 - 99 mg/dL  Glucose, capillary Status: None   Collection Time: 07/12/15 7:39 AM  Result Value Ref Range   Glucose-Capillary 73 65 - 99 mg/dL   Comment 1 Notify RN    Comment 2 Document in Chart   Glucose, capillary Status: Abnormal   Collection Time: 07/12/15 11:29 AM  Result Value Ref Range   Glucose-Capillary 163 (H) 65 - 99 mg/dL      Imaging Results (Last 48 hours)    Dg Chest 2 View  07/11/2015 CLINICAL DATA: Left eye vision changes today. Hemodialysis patient. Initial encounter. EXAM: CHEST 2 VIEW COMPARISON: 04/29/2015 and 12/05/2014. FINDINGS: The heart size and mediastinal contours are stable. Right axillary vascular stent noted. The overall basilar aeration has improved. There is no confluent airspace opacity, edema or significant pleural effusion. The bones appear unchanged. IMPRESSION: No acute cardiopulmonary process. Electronically Signed By: Richardean Sale M.D. On: 07/11/2015 16:57   Ct Head Wo Contrast  07/11/2015 CLINICAL DATA: Intracranial hemorrhage. LEFT pupil nonreactive. Stroke with speech changes. EXAM: CT HEAD WITHOUT CONTRAST TECHNIQUE: Contiguous axial images were obtained from the base of the skull through the vertex without intravenous contrast. COMPARISON: 04/25/2015. FINDINGS: No mass lesion, mass effect, midline shift, hydrocephalus, hemorrhage. No acute territorial cortical ischemia/infarct. Atrophy and chronic ischemic white matter  disease is present. Benign basal ganglia calcifications along with scattered parenchymal calcifications. These are chronic findings and appears similar to the prior exam. Old lacunar infarct in the RIGHT cerebellar hemisphere. Visible paranasal sinuses appear normal. Dense intracranial atherosclerosis. IMPRESSION: Atrophy, chronic ischemic white matter disease and old lacunar infarcts without acute intracranial abnormality. No interval change from 04/25/2015. Electronically Signed By: Dereck Ligas M.D. On: 07/11/2015 13:49     Assessment/Plan: Diagnosis: Pontine infarct with diplopia and balance deficits.  1. Does the need for close, 24 hr/day medical supervision in concert with the patient's rehab needs make it unreasonable for this patient to be served in a less intensive setting? Yes 2. Co-Morbidities requiring supervision/potential complications: ESRD, gout copd, dm2 with neuropathy, htn 3. Due to bladder management, bowel management, safety, skin/wound care, disease management, medication administration, pain management and patient education, does the patient require 24 hr/day rehab nursing? Yes 4. Does the patient require coordinated care of a physician, rehab nurse, PT (1-2 hrs/day, 5 days/week), OT (1-2 hrs/day, 5 days/week) and SLP (1-2 hrs/day, 5 days/week) to address physical and functional deficits in the context of the above medical diagnosis(es)? Yes Addressing deficits in the following areas: balance, endurance, locomotion, strength, transferring, bowel/bladder control, bathing, dressing, feeding, grooming, toileting, speech and psychosocial support 5. Can the patient actively participate in an intensive therapy program of at least 3 hrs of therapy per day at least 5 days per week? Yes 6. The potential for patient to make measurable  gains while on inpatient rehab is excellent 7. Anticipated functional outcomes upon discharge from inpatient rehab are modified independent and  supervision with PT, modified independent and supervision with OT, modified independent with SLP. 8. Estimated rehab length of stay to reach the above functional goals is: 8-12 days 9. Does the patient have adequate social supports and living environment to accommodate these discharge functional goals? Yes 10. Anticipated D/C setting: Home 11. Anticipated post D/C treatments: HH therapy and Outpatient therapy 12. Overall Rehab/Functional Prognosis: excellent  RECOMMENDATIONS: This patient's condition is appropriate for continued rehabilitative care in the following setting: CIR Patient has agreed to participate in recommended program. Yes Note that insurance prior authorization may be required for reimbursement for recommended care.  Comment: Rehab Admissions Coordinator to follow up.  Thanks,  Ernest Staggers, MD, Mellody Drown     07/12/2015

## 2015-07-13 NOTE — PMR Pre-admission (Signed)
PMR Admission Coordinator Pre-Admission Assessment  Patient: Ernest [name] is an 69 y.o., male MRN: [id number] DOB: 1946/10/06 Height: 6' (182.9 cm) Weight: 95.8 kg (211 lb 3.2 oz)              Insurance Information HMO:     PPO:      PCP:      IPA:      80/20:      OTHER:  PRIMARY:  Medicare A and B      Policy#:  [id number]      Subscriber:  self CM Name:       Phone#:      Fax#:  Pre-Cert#:        Employer:  disability Benefits:  Phone #:       Name:  Eff. Date: 05/15/08     Deduct:  $1288      Out of Pocket Max:  none      Life Max:  none CIR:  100%      SNF:  100% first 20 days Outpatient:  80%     Co-Pay:  20% Home Health:  100%      Co-Pay:  DME:  80%     Co-Pay:  20% Providers:  Pt. choice SECONDARY: BCBS      Policy#:  [id number]      Subscriber:  self CM Name:       Phone#:      Fax#:  Pre-Cert#:       Employer:  Benefits:  Phone #:      Name:  Eff. Date:      Deduct:       Out of Pocket Max:       Life Max:  CIR:       SNF:  Outpatient:      Co-Pay:  Home Health:       Co-Pay:  DME:      Co-Pay:   Medicaid Application Date:      Case Manager:  Disability Application Date:       Case Worker:   Emergency Contact Information Contact Information    Name Relation Home Work McLennan Spouse [phone]     Alean Rinne Daughter        Current Medical History  Patient Admitting Diagnosis: Pontine infarct with diplopia and balance deficits. History of Present Illness: Ernest [name] is a 69 y.o. right handed male with history of end-stage renal disease with hemodialysis Monday Wednesday Friday at [facility], hypertension, seizure disorder maintained on Keppra, diabetes mellitus peripheral neuropathy, CAD with stenting and CVA with little residual weakness . Patient had been on aspirin and Plavix but ran out of aspirin was taking only Plavix. Presented 07/11/2015 with diplopia and slurred speech. Lives with spouse independent with  single-point cane prior to admission. Wife is physically limited to provide assistance. Cranial CT scan with chronic ischemic white matter disease old lacunar infarct without acute intracranial abnormality. Echocardiogram with ejection fraction of [id number] grade 1 diastolic dysfunction. Carotid Dopplers with no ICA stenosis. MRI of the brain not completed due to history of penile implant. CT angiogram head and neck are pending. Neurology consulted presently remains on aspirin and Plavix. Subcutaneous heparin for DVT prophylaxis. Regular consistency diet. Physical and occupational therapy evaluation completed with recommendations of physical medicine rehabilitation consult. Patient was admitted for a comprehensive rehabilitation program Total: 1  NIH    Past Medical History  Past Medical History  Diagnosis Date  .  Hyperlipidemia   . Retinopathy   . Gout   . ESRD (end stage renal disease)     Started HD in [place] in 2009, ESRD was due to DM. Moved to [facility] in Dec 2009 and now gets dialysis at [facility] on a MWF schedule.     . Erectile dysfunction     penile implant  . Abnormal stress test     s/p cath November 2013 with modest disease involving the ostial left main, proximal LAD, proximal RCA - do not appear to be hemodynamically signficant; mild LV dysfunction  . Obesity   . Hypertension   . Hepatitis C   . COPD (chronic obstructive pulmonary disease)   . Coronary artery disease     per cath report 2013  . Ejection fraction < 50%   . Chronic systolic CHF (congestive heart failure)   . Bacteremia   . Shortness of breath     Hx: of with exertion  . Gout     Hx: of  . Stroke   . Type II or unspecified type diabetes mellitus without mention of complication, not stated as uncontrolled     adult onset    Family History  family history includes Diabetes in his brother, sister, and son; Heart disease in his father.  Prior Rehab/Hospitalizations:  Has the patient had major surgery during 100  days prior to admission? No  Current Medications   Current facility-administered medications:  .  allopurinol (ZYLOPRIM) tablet 100 mg, 100 mg, Oral, Daily, Marjan Rabbani, MD, 100 mg at 07/13/15 1333 .  amitriptyline (ELAVIL) tablet 25 mg, 25 mg, Oral, QHS, Marjan Rabbani, MD, 25 mg at 07/12/15 2149 .  antiseptic oral rinse (CPC / CETYLPYRIDINIUM CHLORIDE 0.05%) solution 7 mL, 7 mL, Mouth Rinse, q12n4p, Axel Filler, MD, 7 mL at 07/13/15 1336 .  aspirin EC tablet 81 mg, 81 mg, Oral, Daily, Marjan Rabbani, MD, 81 mg at 07/13/15 1333 .  calcium acetate (PHOSLO) capsule 2,001 mg, 2,001 mg, Oral, TID WC, Axel Filler, MD, 2,001 mg at 07/13/15 1342 .  chlorhexidine (PERIDEX) 0.12 % solution 15 mL, 15 mL, Mouth Rinse, BID, Axel Filler, MD, 15 mL at 07/12/15 2148 .  cinacalcet (SENSIPAR) tablet 60 mg, 60 mg, Oral, BID WC, Marjan Rabbani, MD, 60 mg at 07/12/15 1821 .  clopidogrel (PLAVIX) tablet 75 mg, 75 mg, Oral, Daily, Marjan Rabbani, MD, 75 mg at 07/13/15 1333 .  colchicine tablet 0.6 mg, 0.6 mg, Oral, Daily, Marjan Rabbani, MD, 0.6 mg at 07/13/15 1333 .  feeding supplement (NEPRO CARB STEADY) liquid 237 mL, 237 mL, Oral, BID BM, Valentina Gu, NP, 237 mL at 07/12/15 1517 .  heparin injection 5,000 Units, 5,000 Units, Subcutaneous, 3 times per day, Juluis Mire, MD, 5,000 Units at 07/13/15 1334 .  insulin aspart (novoLOG) injection 0-9 Units, 0-9 Units, Subcutaneous, TID WC, Marjan Rabbani, MD, 1 Units at 07/12/15 1402 .  insulin NPH Human (HUMULIN N,NOVOLIN N) injection 8 Units, 8 Units, Subcutaneous, BID AC & HS, Marjan Rabbani, MD, 8 Units at 07/12/15 2156 .  levETIRAcetam (KEPPRA) tablet 1,000 mg, 1,000 mg, Oral, Daily, Kris Mouton, RPH, 1,000 mg at 07/13/15 1334 .  levETIRAcetam (KEPPRA) tablet 500 mg, 500 mg, Oral, Q M,W,F-2000, Kris Mouton, RPH .  multivitamin (RENA-VIT) tablet 1 tablet, 1 tablet, Oral, QHS, Marjan Rabbani, MD, 1 tablet at 07/12/15  2149 .  senna-docusate (Senokot-S) tablet 1 tablet, 1 tablet, Oral, QHS PRN, Juluis Mire, MD .  sevelamer carbonate (RENVELA) tablet 3,200 mg, 3,200 mg, Oral, TID WC, Axel Filler, MD, 3,200 mg at 07/13/15 1333 .  sevelamer carbonate (RENVELA) tablet 800 mg, 800 mg, Oral, Q1500, Valentina Gu, NP, Stopped at 07/12/15 1819 .  simvastatin (ZOCOR) tablet 20 mg, 20 mg, Oral, q1800, Marjan Rabbani, MD, 20 mg at 07/12/15 1821  Patients Current Diet: Diet renal/carb modified with fluid restriction Diet-HS Snack?: Nothing; Room service appropriate?: Yes; Fluid consistency:: Thin  Precautions / Restrictions Precautions Precautions: Fall Precaution Comments: vision deficits Restrictions Weight Bearing Restrictions: No   Has the patient had 2 or more falls or a fall with injury in the past year?No; wife reports pt. Had a fall a year ago when his blood sugar dropped and he passed out, injured his right leg  Prior Activity Level Community (5-7x/wk): Pt. reportss he is out of the home most days.  He goes to HD on MWF via [facility], and goes out for errands with wife on other days.  Wife is "in a wheelchair" however drives.  Home Assistive Devices / Equipment Home Assistive Devices/Equipment: CBG Meter Home Equipment: Environmental consultant - 2 wheels, Cane - single point, Civil engineer, contracting  Prior Device Use: Indicate devices/aids used by the patient prior to current illness, exacerbation or injury? cane used on PRN basis  Prior Functional Level Prior Function Level of Independence: Independent Comments: pt was bathing, dressing, ambulating without AD. Wife has a bad back and cannot physically assist pt, wife drives.  Self Care: Did the patient need help bathing, dressing, using the toilet or eating?  Independent  Indoor Mobility: Did the patient need assistance with walking from room to room (with or without device)? Independent  Stairs: Did the patient need assistance with internal or external stairs  (with or without device)? Independent  Functional Cognition: Did the patient need help planning regular tasks such as shopping or remembering to take medications? Independent  Current Functional Level Cognition  Arousal/Alertness: Awake/alert Overall Cognitive Status: History of cognitive impairments - at baseline (6th grade education, wife states he is at baseline) Orientation Level: Oriented X4 Memory: Impaired Memory Impairment: Storage deficit, Retrieval deficit, Decreased recall of new information (pt did not recall having CT scan ) Awareness: Appears intact (aware to visual deficits) Problem Solving: Appears intact (for basic to call for assist and not try to get OOB on his own)    Extremity Assessment (includes Sensation/Coordination)  Upper Extremity Assessment: RUE deficits/detail, LUE deficits/detail RUE Deficits / Details: 4/5 strength RUE Coordination: decreased gross motor LUE Deficits / Details: 4/5 strength, dialysis graft LUE Coordination: decreased gross motor  Lower Extremity Assessment: Overall WFL for tasks assessed    ADLs  Overall ADL's : Needs assistance/impaired Eating/Feeding Details (indicate cue type and reason): awaiting meal tray Grooming: Wash/dry hands, Wash/dry face, Standing, Minimal assistance Grooming Details (indicate cue type and reason): cues for locating paper towels, soap  Upper Body Bathing: Minimal assitance, Sitting Upper Body Bathing Details (indicate cue type and reason): assisted with back Lower Body Bathing: Minimal assistance, Sit to/from stand Upper Body Dressing : Minimal assistance, Sitting Upper Body Dressing Details (indicate cue type and reason): assist to orient Lower Body Dressing: Minimal assistance, Sit to/from stand Lower Body Dressing Details (indicate cue type and reason): able to don and doff socks, steadying assist needed to pull up pants Toilet Transfer: Minimal assistance, RW, Ambulation, Regular Toilet Toileting-  Clothing Manipulation and Hygiene: Minimal assistance, Sit to/from stand Functional mobility during ADLs: Minimal assistance, Rolling walker  Mobility  Overal bed mobility: Needs Assistance Bed Mobility: Supine to Sit Supine to sit: Supervision General bed mobility comments: pt in chair    Transfers  Overall transfer level: Needs assistance Equipment used: Rolling walker (2 wheeled) Transfers: Sit to/from Stand Sit to Stand: Min assist General transfer comment: posterior bias initially, min assist for balance, verbal cues for hand placement    Ambulation / Gait / Stairs / Wheelchair Mobility  Ambulation/Gait Ambulation/Gait assistance: Min assist, Mod assist Ambulation Distance (Feet): 120 Feet Assistive device: Rolling walker (2 wheeled), None Gait Pattern/deviations: Step-through pattern, Wide base of support General Gait Details: attempted to ambulate without AD however pt required modA to maintain balance and was reaching for hallway rail. pt with noted difficulty with spatial awareness, likely due to vision deficits. once given RW pt with increased stabilty however still required minA during turning for safety Gait velocity: slow    Posture / Balance Balance Overall balance assessment: Needs assistance Sitting-balance support: Feet supported Sitting balance-Leahy Scale: Good Standing balance support: Bilateral upper extremity supported Standing balance-Leahy Scale: Poor Standing balance comment: leans against sink with grooming    Special needs/care consideration BiPAP/CPAP  no CPM  no Continuous Drip IV  no Dialysis   no        Life Vest   no Oxygen   no Special Bed  no Trach Size  no Wound Vac (area)  no       Skin   Per chart, skin tear left ankle                             Bowel mgmt: 07/10/15 last BM Bladder mgmt:  Diabetic mgmt  yes     Previous Home Environment Living Arrangements: Spouse/significant other  Lives With: Spouse, Daughter Available Help  at Discharge: Family Type of Home: House Home Layout: Two level, 1/2 bath on main level Alternate Level Stairs-Rails: Left Alternate Level Stairs-Number of Steps: 14 Home Access: Stairs to enter [employer] of Steps: 1 Bathroom Shower/Tub: Tub/shower unit (garden tub) Biochemist, clinical: Standard Home Care Services: No  Discharge Living Setting Plans for Discharge Living Setting: Patient's home Type of Home at Discharge: House Discharge Home Layout: Two level, 1/2 bath on main level Alternate Level Stairs-Rails: Left Alternate Level Stairs-Number of Steps: 14 Discharge Home Access: Stairs to enter Entrance Stairs-Number of Steps: 1 Discharge Bathroom Shower/Tub: Tub/shower unit (garden tub) Discharge Bathroom Toilet: Standard Discharge Bathroom Accessibility: Yes How Accessible: Accessible via walker Does the patient have any problems obtaining your medications?: No  Social/Family/Support Systems Patient Roles: Spouse, Parent Anticipated Caregiver: wife, Christepher Fronheiser can provide supervision.  Wife  drives but  spends much of time in a wheelchair Anticipated Caregiver's Contact Information: wife, [phone] (home) Ability/Limitations of Caregiver: wife spends much of time in w/c but can drive.  She cannot provide physical assist for pt.  Caregiver Availability: 24/7 Discharge Plan Discussed with Primary Caregiver: Yes Is Caregiver In Agreement with Plan?: Yes Does Caregiver/Family have Issues with Lodging/Transportation while Pt is in Rehab?: No   Goals/Additional Needs Patient/Family Goal for Rehab: modified independent and supervision PT/OT; modified independent SLP Expected length of stay: 8-12 days Cultural Considerations: none Dietary Needs: renal, carb modified with fluid restrictions Equipment Needs: TBA Pt/Family Agrees to Admission and willing to participate: Yes Program Orientation Provided & Reviewed with Pt/Caregiver Including Roles  &  Responsibilities: Yes   Decrease burden of Care through IP rehab admission:  no   Possible need for SNF placement upon discharge: not anticipated   Patient Condition: This patient's condition remains as documented in the consult dated 07/12/15, in which the Rehabilitation Physician determined and documented that the patient's condition is appropriate for intensive rehabilitative care in an inpatient rehabilitation facility. Will admit to inpatient rehab today.  Preadmission Screen Completed By:  Gerlean Ren, 07/13/2015 2:10 PM ______________________________________________________________________   Discussed status with Dr. Naaman Plummer on 07/13/15 at  1411  and received telephone approval for admission today.  Admission Coordinator:  Gerlean Ren, time [id number] Sudie Grumbling 07/13/15

## 2015-07-13 NOTE — Progress Notes (Signed)
Received pt. As a transfer from 34 M.Pt. And his wife were oriented to the unit routine and protocol.Safety plan was explained,fall prevention plan was explained and signed by the pt's wife and RN.Keep monitoring and assessing pt.'s needs.

## 2015-07-13 NOTE — Progress Notes (Signed)
Internal Medicine Attending:   I saw and examined the patient. I reviewed the resident's note and I agree with the resident's findings and plan as documented in the resident's note.  Patient is doing well today. Vision changes remain but are somewhat improved. Tolerated hemodialysis well. Plan for CT head today, which will be 48 hours after initial CT for comparison. Unable to get CT angiogram because of poor IV access, unable to do MRI brain because of penile implant. Plan for transfer to acute inpatient rehabilitation later today. Continuing aspirin, clopidogrel, and simvastatin for secondary prevention.

## 2015-07-13 NOTE — Progress Notes (Signed)
STROKE TEAM PROGRESS NOTE  HPI Ernest [name] is an 69 y.o. male male with multiple stroke risk factors. HE states he went to sleep last night normal. He awoke at 0200 and noted he was seeing diplopia and tended to bump in to the wall. He went to dialysis and due to continued diplopia EMS was called and he was brought to hospital after his treatment. Currently he has no complaints of HA but continues to have double vision He also has dysarthria but this is secondary to previous stroke. Initial exam per ED MD showed a bilateral INO like picture but at time of neurology consultation he was unable to move his left eye and only able to laterally deviate his right eye. patient to go for stat CT head.   Date last known well: Date: 07/11/2015 Time last known well: Time: 02:00 tPA Given: No: out of window   SUBJECTIVE (INTERVAL HISTORY) The patient's wife was at the bedside. The patient feels he is improving. Diplopia some better. Not able to get CTA due to no access. Not able to get MRI due to penile implants. Repeat CT no acute abnormalities.   OBJECTIVE Temp:  [97.7 F (36.5 C)-98.7 F (37.1 C)] 97.9 F (36.6 C) (09/28 1407) Pulse Rate:  [61-78] 64 (09/28 1407) Cardiac Rhythm:  [-] Normal sinus rhythm (09/28 1200) Resp:  [16-18] 16 (09/28 1407) BP: (109-211)/(49-90) 133/49 mmHg (09/28 1407) SpO2:  [93 %-100 %] 99 % (09/28 1407) Weight:  [211 lb 3.2 oz (95.8 kg)-216 lb 7.9 oz (98.2 kg)] 211 lb 3.2 oz (95.8 kg) (09/28 1200)  CBC:   Recent Labs Lab 07/11/15 1357  07/12/15 0535 07/13/15 0812  WBC 6.6  --  6.5 6.5  NEUTROABS 4.1  --   --   --   HGB 13.1  < > 12.1* 11.7*  HCT 38.5*  < > 36.3* 35.1*  MCV 85.0  --  86.2 85.2  PLT PLATELET CLUMPS NOTED ON SMEAR, COUNT APPEARS ADEQUATE  --  156 162  < > = values in this interval not displayed.  Basic Metabolic Panel:   Recent Labs Lab 07/12/15 0535 07/13/15 0812  NA 139 136  K 3.2* 4.0  CL 95* 92*  CO2 33* 34*  GLUCOSE 115* 82   BUN 18 34*  CREATININE 7.49* 9.80*  CALCIUM 8.4* 8.8*  MG  --  2.6*  PHOS 4.3 4.9*    Lipid Panel:     Component Value Date/Time   CHOL 96 07/12/2015 0535   TRIG 79 07/12/2015 0535   HDL 44 07/12/2015 0535   CHOLHDL 2.2 07/12/2015 0535   VLDL 16 07/12/2015 0535   LDLCALC 36 07/12/2015 0535   HgbA1c:  Lab Results  Component Value Date   HGBA1C 7.7* 07/12/2015   Urine Drug Screen: No results found for: LABOPIA, COCAINSCRNUR, LABBENZ, AMPHETMU, THCU, LABBARB    IMAGING  I have personally reviewed the radiological images below and agree with the radiology interpretations.  Dg Chest 2 View 07/11/2015    No acute cardiopulmonary process.     Ct Head Wo Contrast 07/11/2015    Atrophy, chronic ischemic white matter disease and old lacunar infarcts without acute intracranial abnormality. No interval change from 04/25/2015  Repeat CT head - pending  CUS - Bilateral: 1-39% ICA stenosis. Vertebral artery flow is antegrade.  2D echo - - Left ventricle: The cavity size was normal. There was severe concentric hypertrophy. Systolic function was mildly reduced. The estimated ejection fraction was in the  range of 45% to 50%. Mild hypokinesis of the basal to mid anterior, anteroseptal and inferoseptal walls. Doppler parameters are consistent with abnormal left ventricular relaxation (grade 1 diastolic dysfunction). - Aortic valve: Transvalvular velocity was within the normal range. There was no stenosis. There was no regurgitation. - Mitral valve: There was trivial regurgitation. - Left atrium: The atrium was mildly dilated. - Right ventricle: The cavity size was normal. Wall thickness was normal. Systolic function was normal.   PHYSICAL EXAM  Temp:  [97.7 F (36.5 C)-98.7 F (37.1 C)] 97.9 F (36.6 C) (09/28 1407) Pulse Rate:  [61-78] 64 (09/28 1407) Resp:  [16-18] 16 (09/28 1407) BP: (109-211)/(49-90) 133/49 mmHg (09/28 1407) SpO2:  [93 %-100 %] 99 %  (09/28 1407) Weight:  [211 lb 3.2 oz (95.8 kg)-216 lb 7.9 oz (98.2 kg)] 211 lb 3.2 oz (95.8 kg) (09/28 1200)  General - Well nourished, well developed, in no apparent distress.  Ophthalmologic - Fundi not visualized due to small pupils.  Cardiovascular - Regular rate and rhythm.  Mental Status -  Level of arousal and orientation to time, place, and person were intact. Language including expression, naming, repetition, comprehension was assessed and found intact, but mild dysarthria Fund of Knowledge was assessed and was intact.  Cranial Nerves II - XII - II - Visual field intact OU. III, IV, VI - left eye adduction palsy and abduction partially palsy. Right eye attends to both sides. V - Facial sensation intact bilaterally. VII - Facial movement intact bilaterally. VIII - Hearing & vestibular intact bilaterally. X - Palate elevates symmetrically, mild dysarthria. XI - Chin turning & shoulder shrug intact bilaterally. XII - Tongue protrusion intact.  Motor Strength - The patient's strength was normal in all extremities and pronator drift was absent.  Bulk was normal and fasciculations were absent.   Motor Tone - Muscle tone was assessed at the neck and appendages and was normal.  Reflexes - The patient's reflexes were 1+ in all extremities and he had no pathological reflexes.  Sensory - Light touch, temperature/pinprick were assessed and were symmetrical.    Coordination - The patient had normal movements in the hands and feet with no ataxia or dysmetria.  Tremor was absent.  Gait and Station - walk with walker in hallway, very slow and cautious gait, mild unsteadiness.   ASSESSMENT/PLAN Ernest [name] is a 69 y.o. male with history of ESRD on HD, HLD, DM, HTN, obesity, hepatitis C, COPD, CAD, previous stroke, and CHF presenting with diplopia with ocular movement abnormality. He did not receive IV t-PA due to late presentation.   Stroke:  Likely pontine infarct due to small  vessel disease.  Resultant  diplopia with ocular movement abnormalities.  MRI / MRA not able to do due to penile implant.   Carotid Doppler unremarkable  2D Echo EF 45-50%  LDL 36  HgbA1c 7.7  VTE prophylaxis subcutaneous heparin Diet renal/carb modified with fluid restriction Diet-HS Snack?: Nothing; Room service appropriate?: Yes; Fluid consistency:: Thin  aspirin 81 mg orally every day and clopidogrel 75 mg orally every day prior to admission, now on aspirin 81 mg orally every day and clopidogrel 75 mg orally every day. Continue dual antiplatelet for stroke and cardiac prevention.  Patient counseled to be compliant with his antithrombotic medications  Ongoing aggressive stroke risk factor management  Therapy recommendations: CIR recommended.  Disposition:  CIR today  Diabetes  HgbA1c 7.7, goal < 7.0  Uncontrolled   On insulin   SSI  Need close PCP follow up  Hypertension  Stable BP goal gradually normalize  Hyperlipidemia  Home meds: Zocor 20 mg daily  resumed in hospital  LDL 36, goal < 70  Continue statin on discharge  Other Stroke Risk Factors  Advanced age  Cigarette smoker, quit smoking 42 years ago.  Hx stroke/TIA  Coronary artery disease  ESRD on HD  Other Active Problems  Hypokalemia  Mild anemia   Hospital day # 2  Neurology will sign off. Please call with questions. Pt will follow up with Dr. Erlinda Hong at [facility] in about 2 months. Thanks for the consult.   Rosalin Hawking, MD PhD Stroke Neurology 07/13/2015 3:11 PM    To contact Stroke Continuity provider, please refer to [email or web address] After hours, contact General Neurology

## 2015-07-13 NOTE — Progress Notes (Signed)
Patient is being transferred to in-patient-rehab. Report called to the receiving nurse.

## 2015-07-13 NOTE — Progress Notes (Signed)
I have spoken with Dr. Lake Bells.  We will plan to admit pt.to IP Rehab following his CT scan .  I will update pt. and wife, as well as Lorne Skeens, CM , RN and Education officer, museum.  I will make all necessary arrangements.  Please call if questions.  [facility] Admissions Coordinator Cell [phone] Office [phone]

## 2015-07-13 NOTE — Progress Notes (Signed)
Subjective:  Pt seen and examined in AM. No acute events overnight. He reports his double vision is better and is able to watch tv. He has not tried walking today. He denies other complaints.     Objective: Vital signs in last 24 hours: Filed Vitals:   07/12/15 1822 07/12/15 2127 07/13/15 0209 07/13/15 0553  BP: 170/71 192/68 188/59 185/62  Pulse: 72 72 63 64  Temp: 97.7 F (36.5 C) 98.4 F (36.9 C) 98.7 F (37.1 C) 98.5 F (36.9 C)  TempSrc: Axillary Oral Oral Oral  Resp: 16 17 18 16   Height:      Weight:      SpO2: 93% 95% 93% 94%   Weight change:  No intake or output data in the 24 hours ending 07/13/15 0750  PHYSICAL EXAMINATION:  General: NAD Heart: Normal rate and rhythm  Lungs: Clear to auscultation bilaterally with no wheezing, ronchi, or rales Abdomen: Soft, non-tender, non-distended, normal BS Extremities: No edema  Neuro: Unable to adduct left eye   Lab Results: Basic Metabolic Panel:  Recent Labs Lab 07/11/15 1400 07/11/15 1402 07/12/15 0535  NA 142 140 139  K 3.2* 3.9 3.2*  CL 95* 93* 95*  CO2 37*  --  33*  GLUCOSE 100* 101* 115*  BUN 14 22* 18  CREATININE 5.63* 5.10* 7.49*  CALCIUM 8.6*  --  8.4*  PHOS  --   --  4.3   Liver Function Tests:  Recent Labs Lab 07/11/15 1400 07/12/15 0535  AST 19  --   ALT 16*  --   ALKPHOS 54  --   BILITOT 0.4  --   PROT 6.5  --   ALBUMIN 3.4* 3.2*   CBC:  Recent Labs Lab 07/11/15 1357 07/11/15 1402 07/12/15 0535  WBC 6.6  --  6.5  NEUTROABS 4.1  --   --   HGB 13.1 13.6 12.1*  HCT 38.5* 40.0 36.3*  MCV 85.0  --  86.2  PLT PLATELET CLUMPS NOTED ON SMEAR, COUNT APPEARS ADEQUATE  --  156   CBG:  Recent Labs Lab 07/12/15 1401 07/12/15 1622 07/12/15 2036 07/13/15 0004 07/13/15 0406 07/13/15 0717  GLUCAP 147* 108* 192* 146* 82 72   Hemoglobin A1C:  Recent Labs Lab 07/12/15 0535  HGBA1C 7.7*   Fasting Lipid Panel:  Recent Labs Lab 07/12/15 0535  CHOL 96  HDL 44  LDLCALC 36   TRIG 79  CHOLHDL 2.2   Thyroid Function Tests:  Recent Labs Lab 07/12/15 0535  TSH 1.064   Coagulation:  Recent Labs Lab 07/11/15 1357  LABPROT 13.2  INR 0.98    Alcohol Level:  Recent Labs Lab 07/11/15 1400  ETH <5    Studies/Results: Dg Chest 2 View  07/11/2015   CLINICAL DATA:  Left eye vision changes today. Hemodialysis patient. Initial encounter.  EXAM: CHEST  2 VIEW  COMPARISON:  04/29/2015 and 12/05/2014.  FINDINGS: The heart size and mediastinal contours are stable. Right axillary vascular stent noted. The overall basilar aeration has improved. There is no confluent airspace opacity, edema or significant pleural effusion. The bones appear unchanged.  IMPRESSION: No acute cardiopulmonary process.   Electronically Signed   By: Richardean Sale M.D.   On: 07/11/2015 16:57   Ct Head Wo Contrast  07/11/2015   CLINICAL DATA:  Intracranial hemorrhage. LEFT pupil nonreactive. Stroke with speech changes.  EXAM: CT HEAD WITHOUT CONTRAST  TECHNIQUE: Contiguous axial images were obtained from the base of the skull through the vertex  without intravenous contrast.  COMPARISON:  04/25/2015.  FINDINGS: No mass lesion, mass effect, midline shift, hydrocephalus, hemorrhage. No acute territorial cortical ischemia/infarct. Atrophy and chronic ischemic white matter disease is present. Benign basal ganglia calcifications along with scattered parenchymal calcifications. These are chronic findings and appears similar to the prior exam. Old lacunar infarct in the RIGHT cerebellar hemisphere. Visible paranasal sinuses appear normal. Dense intracranial atherosclerosis.  IMPRESSION: Atrophy, chronic ischemic white matter disease and old lacunar infarcts without acute intracranial abnormality. No interval change from 04/25/2015.   Electronically Signed   By: Dereck Ligas M.D.   On: 07/11/2015 13:49   Medications: I have reviewed the patient's current medications. Scheduled Meds: . allopurinol  100  mg Oral Daily  . amitriptyline  25 mg Oral QHS  . antiseptic oral rinse  7 mL Mouth Rinse q12n4p  . aspirin EC  81 mg Oral Daily  . calcium acetate  2,001 mg Oral TID WC  . chlorhexidine  15 mL Mouth Rinse BID  . cinacalcet  60 mg Oral BID WC  . clopidogrel  75 mg Oral Daily  . colchicine  0.6 mg Oral Daily  . feeding supplement (NEPRO CARB STEADY)  237 mL Oral BID BM  . heparin  5,000 Units Subcutaneous 3 times per day  . Influenza vac split quadrivalent PF  0.5 mL Intramuscular Tomorrow-1000  . insulin aspart  0-9 Units Subcutaneous TID WC  . insulin NPH Human  8 Units Subcutaneous BID AC & HS  . levETIRAcetam  1,000 mg Oral Daily  . levETIRAcetam  500 mg Oral Q M,W,F-2000  . multivitamin  1 tablet Oral QHS  . sevelamer carbonate  3,200 mg Oral TID WC  . sevelamer carbonate  800 mg Oral Q1500  . 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 Etiology most likely due to pontine CVA. Trigger most likely due to missing aspirin therapy.  -Appreciate neurology recommendations  -Obtain CT head w/o cont since CTA head and neck could not be performed due to IV access or MRI brain due to penile implant -Allow permissive hypertension with BP up to 220/120  -Continue aspirin 81 mg daily and plavix 75 mg daily for secondary stroke prevention (discussed with neurology) -Awaiting CIR placement for today depending on insurance coverage and imaging   Hypertension - BP currently 185/62  -Allow permissive hypertension with BP up to 220/120  -Hold home lisinopril 20 mg daily and lopressor 50 mg daily   ESRD on HD - HD today. Pt euvolemic on exam. Chest xray on admission with no pulmonary edema. -Appreciate nephrology following  -Monitor renal function panel -Continue home sensipar 60 mg BID,  rena-vit daily, phoslo 2001 mg TID with meals, and renvela [phone] mg x5 daily  -Continue renal diet with fluid restriction    Insulin-dependent Type 2 DM - CBG 72 this AM. A1c 7.7 on 07/12/15. Pt at home on insulin NPH 10 U BID.  -Continue insulin NPH 8 U BID -Monitor CBG's before meals and at bedtime  -Continue amitriptyline 25 mg daily at bedtime for peripheral neuropathy   Hyperlipidemia - Lipid panel on 07/12/15 with LDL 36. -Continue zocor 20 mg daily   Chronic combined CHF - Pt currently euvolemic. 2D-echo on 07/12/15 with 45-45% with grade 1 diastolic dysfunction.  -Monitor daily weights  and strict I & O's -Hold home lisinopril 20 mg daily and lopressor 50 mg daily to allow for permissive hypertension   Seizure disorder - No recent seizure activity.  -Continue PO keppra 1000 mg daily   Non-tophaceous gout - No recent flare.  -Continue home allopurinol 100 mg daily and colchicine 0.6 mg daily   HIV & HCV screening - Pt negative for HIV and HCV infections.    Diet: Renal  DVT Ppx: SQ heparin  Code: Full       Dispo: Disposition is deferred at this time, awaiting improvement of current medical problems.  Anticipated discharge is today.   The patient does not have a current PCP (No primary care provider on file.) and does need an [facility] hospital follow-up appointment after discharge.  The patient does have transportation limitations that hinder transportation to clinic appointments.  .Services Needed at time of discharge: Y = Yes, Blank = No PT:    OT:   RN:   Equipment:   Other:  CIR    LOS: 2 days   Juluis Mire, MD 07/13/2015, 7:50 AM

## 2015-07-13 NOTE — Procedures (Signed)
Pt seen on HD.  Ap 100 Vp 220.  BFR 400.  Will pull 3L as unable to stand to get weight.  Note possible transfer to rehab.  Labs pend

## 2015-07-13 NOTE — Discharge Summary (Signed)
Name: Ernest [name] MRN: [id number] DOB: [birth year] 69 y.o. PCP: No primary care provider on file.  Date of Admission: 07/11/2015 12:35 PM Date of Discharge: 07/13/2015 Attending Physician: Axel Filler, MD  Discharge Diagnosis:   Left Eye Horizontal Gaze Palsy in setting of probable right pontine CVA  Hypertension  ESRD on HD  Insulin-dependent Type 2 DM  Hyperlipidemia CAD  Chronic combined CHF  Seizure disorder Non-tophaceous gout  HIV & HCV screening DVT Prophylaxis      Discharge Medications:   Medication List    STOP taking these medications        lisinopril 20 MG tablet  Commonly known as:  PRINIVIL,ZESTRIL     metoprolol 50 MG tablet  Commonly known as:  LOPRESSOR      TAKE these medications        allopurinol 100 MG tablet  Commonly known as:  ZYLOPRIM  Take 100 mg by mouth daily.     amitriptyline 25 MG tablet  Commonly known as:  ELAVIL  TAKE ONE TABLET BY MOUTH AT BEDTIME FOR  NEUROPATHY     aspirin EC 81 MG tablet  Take 81 mg by mouth daily.     Calcium Acetate 667 MG Tabs  Take 2,001 tablets by mouth 3 (three) times daily with meals.     cinacalcet 60 MG tablet  Commonly known as:  SENSIPAR  Take 60 mg by mouth 2 (two) times daily.     clopidogrel 75 MG tablet  Commonly known as:  PLAVIX  Take 75 mg by mouth daily.     colchicine 0.6 MG tablet  Take 0.6 mg by mouth daily.     insulin NPH Human 100 UNIT/ML injection  Commonly known as:  HUMULIN N,NOVOLIN N  Inject 0.1 mLs (10 Units total) into the skin 2 (two) times daily.     levETIRAcetam 500 MG tablet  Commonly known as:  KEPPRA  Take 2 tablets (1,000 mg total) by mouth daily. Then take extra 500 mg (1 tablet) on MWF after HD     lidocaine-prilocaine cream  Commonly known as:  EMLA  Apply 1 application topically as needed (dialysis).     multivitamin Tabs tablet  Take 1 tablet by mouth daily.     sevelamer carbonate 800 MG tablet  Commonly known as:  RENVELA    Take 800-3,200 mg by mouth 5 (five) times daily. 4 tablets three times daily with meals and 1 tablet twice daily with snacks.     simvastatin 20 MG tablet  Commonly known as:  ZOCOR  Take 20 mg by mouth daily at 6 PM.        Disposition and follow-up:   Mr.Ernest [name] was discharged from [facility] in Good condition.  At the hospital follow up visit please address:  1.  Left Eye Horizontal Gaze Palsy in setting of probable right pontine CVA - Etiology most likely due to small vessel disease and recent missing of aspirin therapy. Needs to continue aspirin 81 mg daily and plavix 75 mg daily for secondary stroke prevention. Needs to follow-up with neurologist Dr. Erlinda Hong in 2 months.    Hypertension - Pt's home lisinopril 20 mg daily and lopressor 50 mg daily were held during hospitalization to allow for permissive hypertension (220/12).    Type 2 Diabetes Mellitus - Pt at home on insulin NPH 10 U BID which he received 8 U BID during with well-controlled blood glucose during hospitalization.  2.  Labs / imaging needed at time of follow-up: None  3.  Pending labs/ test needing follow-up: None   Follow-up Appointments:   Discharge Instructions:   Consultations: Treatment Team:  Md Stroke, MD Fleet Contras, MD  Procedures Performed:  Dg Chest 2 View  07/11/2015   CLINICAL DATA:  Left eye vision changes today. Hemodialysis patient. Initial encounter.  EXAM: CHEST  2 VIEW  COMPARISON:  04/29/2015 and 12/05/2014.  FINDINGS: The heart size and mediastinal contours are stable. Right axillary vascular stent noted. The overall basilar aeration has improved. There is no confluent airspace opacity, edema or significant pleural effusion. The bones appear unchanged.  IMPRESSION: No acute cardiopulmonary process.   Electronically Signed   By: Richardean Sale M.D.   On: 07/11/2015 16:57   Ct Head Wo Contrast  07/11/2015   CLINICAL DATA:  Intracranial hemorrhage. LEFT  pupil nonreactive. Stroke with speech changes.  EXAM: CT HEAD WITHOUT CONTRAST  TECHNIQUE: Contiguous axial images were obtained from the base of the skull through the vertex without intravenous contrast.  COMPARISON:  04/25/2015.  FINDINGS: No mass lesion, mass effect, midline shift, hydrocephalus, hemorrhage. No acute territorial cortical ischemia/infarct. Atrophy and chronic ischemic white matter disease is present. Benign basal ganglia calcifications along with scattered parenchymal calcifications. These are chronic findings and appears similar to the prior exam. Old lacunar infarct in the RIGHT cerebellar hemisphere. Visible paranasal sinuses appear normal. Dense intracranial atherosclerosis.  IMPRESSION: Atrophy, chronic ischemic white matter disease and old lacunar infarcts without acute intracranial abnormality. No interval change from 04/25/2015.   Electronically Signed   By: Dereck Ligas M.D.   On: 07/11/2015 13:49    2D Echo: 07/12/15  Study Conclusions  - Left ventricle: The cavity size was normal. There was severe concentric hypertrophy. Systolic function was mildly reduced. The estimated ejection fraction was in the range of 45% to 50%. Mild hypokinesis of the basal to mid anterior, anteroseptal and inferoseptal walls. Doppler parameters are consistent with abnormal left ventricular relaxation (grade 1 diastolic dysfunction). - Aortic valve: Transvalvular velocity was within the normal range. There was no stenosis. There was no regurgitation. - Mitral valve: There was trivial regurgitation. - Left atrium: The atrium was mildly dilated. - Right ventricle: The cavity size was normal. Wall thickness was normal. Systolic function was normal.   Admission HPI: Original Author Ernest Mire MD  Ernest [name] is a 69 year old-man with past medical history of ESRD on HD MWF, hypertension, hyperlipidemia, insulin-dependent Type 2 DM, COPD, ED s/p implant, seizure  disorder, CVA, chronic combined CHF, CAD, and gout who presents with chief complaint of diplopia.   He reports that at 2 AM this morning he began to have double vision when trying to use the restroom causing him to bump into the wall. He also had difficulty walking to the bus to go to HD and continued to have diplopia and ataxia after HD at 11 AM at which point he was sent to the ED for further evaluation. He is unsure when he was last well. He reports continued diplopia and left eye blurry vision which improves if he closes his right eye without eye pain or photosensitivity. He has chronic tearing of his eyes. He denies similar symptoms in the past. He denies headache, dizziness, extremity weakness, paraesthesias, facial droop, confusion, or memory loss.   He reports having a stroke in the past with residual dysarthria and dysphagia. He is supposed to be taking aspirin 81 mg daily and plavix 75  mg daily but per wife ran out of out aspirin for the past few weeks. He denies family history of stroke. He used to smoke 2 cigars a day for 6 years but quit in the 1970's. He reports compliance with taking his anti-hypertensive and anti-hyperlipidemia medications. He has not taken his anti-hypertensive medications today after HD.  Hospital Course by problem list:   Left Eye Horizontal Gaze Palsy in setting of probable pontine CVA - Pt presented with one-day history of diplopia causing blurry vision and unsteady gait. 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 Pt did not receive tPA due to late presentation. CT head w/o contrast did not reveal acute abnormality. MRI brain could not be performed due to penile implant. CTA head and neck could not be performed due to difficulty with obtaining IV access. Repeat CT head w/o contrast on 07/13/15 was unchanged. 2D-echo on 07/12/15 with 45-45% and grade 1 diastolic  dysfunction. Doppler carotids were unremarkable. Pt's home lisinopril 20 mg daily and lopressor 50 mg daily were held during hospitalization to allow for permissive hypertension. Etiology most likely due to pontine CVA in setting of small vessel disease and recent missing of aspirin therapy.Pt was able to partially abduct but not adduct his left eye on day of discharge. Pt was continued on home aspirin 81 mg daily and plavix 75 mg daily for secondary stroke prevention during hospitalization. Pt was seen by PT and OT who recommended [facility] which he qualified for. Pt to follow-up with Dr. Erlinda Hong in 2 months.   Hypertension - Pt with blood pressure range of 205/56-126/78 during hospitalization. Pt's home lisinopril 20 mg daily and lopressor 50 mg daily were held during hospitalization to allow for permissive hypertension.   ESRD on HD - Pt on HD MWF which was continued during hospitalization. Chest xray on admission with no pulmonary edema. Pt was continued on home sensipar 60 mg BID, rena-vit daily, phoslo 2001 mg TID with meals, and renvela [phone] mg x5 daily during hospitalization.     Insulin-dependent Type 2 DM - Pt with A1c of 7.7 on 07/12/15. Pt at home on insulin NPH 10 U BID which he received 8 U BID. Pt with blood glucose range of 56-191 during hospitalization. Pt was continued on home amitriptyline 25 mg daily at bedtime for chronic peripheral neuropathy.    Hyperlipidemia - Lipid panel on 07/12/15 with LDL 36. Pt was continued on home zocor 20 mg daily during hospitalization.   CAD - Pt with no anginal symptoms during hospitalizaiton. Pt was continued on dual AP therapy with aspirin and plavix daily. Pt also received home zocor. Pt's home lisinopril and lopressor were held during hospitalization to allow for permissive hypertension.   Chronic combined CHF - Pt with no exacerbation during hospitalization. 2D-echo on 07/12/15 with 45-45% with grade 1 diastolic dysfunction. Pt's home  lisinopril 20 mg daily and lopressor 50 mg daily were held during hospitalization to allow for permissive hypertension   Seizure disorder - Pt with no seizure activity during hospitalization. Pt was continued on home keppra 1000 mg daily during hospitalization.    Non-tophaceous gout - Pt with no recent flare. Pt was continued on home allopurinol 100 mg daily and colchicine 0.6 mg daily.   HIV & HCV screening - Pt tested negative for HIV and HCV infections.   DVT Prophylaxis - Pt received SQ heparin during  hospitalization with no evidence of DVT during hospitalization.       Discharge Vitals:   BP 133/49 mmHg  Pulse 64  Temp(Src) 97.9 F (36.6 C) (Oral)  Resp 16  Ht 6' (1.829 m)  Wt 211 lb 3.2 oz (95.8 kg)  BMI 28.64 kg/m2  SpO2 99%  Discharge Labs:  Results for orders placed or performed during the hospital encounter of 07/11/15 (from the past 24 hour(s))  Glucose, capillary     Status: Abnormal   Collection Time: 07/12/15  4:22 PM  Result Value Ref Range   Glucose-Capillary 108 (H) 65 - 99 mg/dL   Comment 1 Notify RN    Comment 2 Document in Chart   Glucose, capillary     Status: Abnormal   Collection Time: 07/12/15  8:36 PM  Result Value Ref Range   Glucose-Capillary 192 (H) 65 - 99 mg/dL   Comment 1 Notify RN    Comment 2 Document in Chart   Glucose, capillary     Status: Abnormal   Collection Time: 07/13/15 12:04 AM  Result Value Ref Range   Glucose-Capillary 146 (H) 65 - 99 mg/dL   Comment 1 Notify RN    Comment 2 Document in Chart   Glucose, capillary     Status: None   Collection Time: 07/13/15  4:06 AM  Result Value Ref Range   Glucose-Capillary 82 65 - 99 mg/dL   Comment 1 Notify RN    Comment 2 Document in Chart   Glucose, capillary     Status: None   Collection Time: 07/13/15  7:17 AM  Result Value Ref Range   Glucose-Capillary 72 65 - 99 mg/dL  Magnesium     Status: Abnormal   Collection Time: 07/13/15  8:12 AM  Result Value Ref Range    Magnesium 2.6 (H) 1.7 - 2.4 mg/dL  CBC     Status: Abnormal   Collection Time: 07/13/15  8:12 AM  Result Value Ref Range   WBC 6.5 4.0 - 10.5 K/uL   RBC 4.12 (L) 4.22 - 5.81 MIL/uL   Hemoglobin 11.7 (L) 13.0 - 17.0 g/dL   HCT 35.1 (L) 39.0 - 52.0 %   MCV 85.2 78.0 - 100.0 fL   MCH 28.4 26.0 - 34.0 pg   MCHC 33.3 30.0 - 36.0 g/dL   RDW 14.6 11.5 - 15.5 %   Platelets 162 150 - 400 K/uL  Renal function panel     Status: Abnormal   Collection Time: 07/13/15  8:12 AM  Result Value Ref Range   Sodium 136 135 - 145 mmol/L   Potassium 4.0 3.5 - 5.1 mmol/L   Chloride 92 (L) 101 - 111 mmol/L   CO2 34 (H) 22 - 32 mmol/L   Glucose, Bld 82 65 - 99 mg/dL   BUN 34 (H) 6 - 20 mg/dL   Creatinine, Ser 9.80 (H) 0.61 - 1.24 mg/dL   Calcium 8.8 (L) 8.9 - 10.3 mg/dL   Phosphorus 4.9 (H) 2.5 - 4.6 mg/dL   Albumin 3.0 (L) 3.5 - 5.0 g/dL   GFR calc non Af Amer 5 (L) >60 mL/min   GFR calc Af Amer 6 (L) >60 mL/min   Anion gap 10 5 - 15    Signed: Juluis Mire, MD 07/13/2015, 2:24 PM    Services Ordered on Discharge: CIR Equipment Ordered on Discharge: CIR

## 2015-07-13 NOTE — Progress Notes (Addendum)
Inpatient Rehabilitation  I met with the patient at the bedside in HD to discuss his post acute rehab needs.  I answered his questions and phoned his wife to discuss a  Potential IP Rehab admission with her.  Pt. and wife both strongly want pt. to come to IP rehab.     I have spoken with Dr. Caralyn Guile then Dr. Florentina Addison regarding potential pt. readiness for rehab.  Pt. has orders for CTA, however in my speaking with the CT department, CT indicates they have made 4 attempts to gain  access and have not had success for completion of CTA.  I have discussed with Dr. Erlinda Hong who plans to reach out to Dr. Lake Bells to make further diagnostic plans.  I await a return call from Dr. Lake Bells.  Please call if questions.  [facility] Coordinator Cell [phone] Office [phone]  Addendum: [phone])  Per Dr. Lake Bells, he has spoken to Dr. Erlinda Hong and plan now is for noncontrast CT.  Per CT department, this will be completed following pt's HD today.  Will follow up with medical team once CT completed for potential admission to rehab today.  Gerlean Ren

## 2015-07-14 ENCOUNTER — Inpatient Hospital Stay (HOSPITAL_COMMUNITY): Payer: Medicare Other | Admitting: Occupational Therapy

## 2015-07-14 ENCOUNTER — Inpatient Hospital Stay (HOSPITAL_COMMUNITY): Payer: Medicare Other | Admitting: Speech Pathology

## 2015-07-14 ENCOUNTER — Inpatient Hospital Stay (HOSPITAL_COMMUNITY): Payer: Medicare Other | Admitting: Physical Therapy

## 2015-07-14 LAB — GLUCOSE, CAPILLARY
GLUCOSE-CAPILLARY: 121 mg/dL — AB (ref 65–99)
GLUCOSE-CAPILLARY: 130 mg/dL — AB (ref 65–99)
GLUCOSE-CAPILLARY: 141 mg/dL — AB (ref 65–99)
GLUCOSE-CAPILLARY: 170 mg/dL — AB (ref 65–99)
GLUCOSE-CAPILLARY: 81 mg/dL (ref 65–99)
Glucose-Capillary: 86 mg/dL (ref 65–99)

## 2015-07-14 NOTE — Plan of Care (Signed)
Problem: RH BOWEL ELIMINATION Goal: RH STG MANAGE BOWEL WITH ASSISTANCE STG Manage Bowel with Min. Assistance.  Outcome: Not Progressing No BM x 3 days

## 2015-07-14 NOTE — Evaluation (Signed)
Speech Language Pathology Assessment and Plan  Patient Details  Name: Ernest [name] MRN: [id number] Date of Birth: [birth year]  SLP Diagnosis: Cognitive Impairments;Speech and Language deficits;Dysarthria  Rehab Potential: Excellent ELOS: 5-7 days     Today's Date: 07/14/2015 SLP Individual Time: 8416-6063 SLP Individual Time Calculation (min): 62 min   Problem List:  Patient Active Problem List   Diagnosis Date Noted  . Right pontine cerebrovascular accident 07/13/2015  . Stroke 07/11/2015  . Diplopia 07/11/2015  . Obstructive sleep apnea 06/16/2013  . Hyperlipidemia   . ESRD (end stage renal disease)   . Erectile dysfunction   . Obesity   . Hypertension   . Hepatitis C   . COPD (chronic obstructive pulmonary disease)   . Coronary artery disease   . Ejection fraction < 50%   . Chronic combined systolic and diastolic CHF (congestive heart failure)   . Cardiomyopathy, ischemic 06/10/2013  . Uncontrolled type 2 diabetes mellitus with end-stage renal disease 06/08/2013  . AODM 02/28/2009  . GOUT, UNSPECIFIED 02/28/2009  . EXUDATIVE RETINOPATHY 02/28/2009  . CKD (chronic kidney disease) stage V requiring chronic dialysis 02/28/2009   Past Medical History:  Past Medical History  Diagnosis Date  . Hyperlipidemia   . Retinopathy   . Gout   . ESRD (end stage renal disease)     Started HD in [place] in 2009, ESRD was due to DM. Moved to [facility] in Dec 2009 and now gets dialysis at [facility] on a MWF schedule.     . Erectile dysfunction     penile implant  . Abnormal stress test     s/p cath November 2013 with modest disease involving the ostial left main, proximal LAD, proximal RCA - do not appear to be hemodynamically signficant; mild LV dysfunction  . Obesity   . Hypertension   . Hepatitis C   . COPD (chronic obstructive pulmonary disease)   . Coronary artery disease     per cath report 2013  . Ejection fraction < 50%   . Chronic systolic CHF (congestive heart failure)    . Bacteremia   . Shortness of breath     Hx: of with exertion  . Gout     Hx: of  . Stroke   . Type II or unspecified type diabetes mellitus without mention of complication, not stated as uncontrolled     adult onset   Past Surgical History:  Past Surgical History  Procedure Laterality Date  . Penile prosthesis implant      1997.. no card  . Fistula      RUE and wrist  . Cardiac catheterization  August 26, 2012  . Eye surgery      laser.  and surgery for DM  . Insertion of dialysis catheter Right 01/01/2013    Procedure: INSERTION OF DIALYSIS CATHETER right  internal jugular;  Surgeon: Serafina Mitchell, MD;  Location: [place];  Service: Vascular;  Laterality: Right;  . Av fistula placement Left 01/13/2013    Procedure: INSERTION OF ARTERIOVENOUS (AV) GORE-TEX GRAFT ARM;  Surgeon: Angelia Mould, MD;  Location: [place];  Service: Vascular;  Laterality: Left;  . Thrombectomy w/ embolectomy Left 03/26/2013    Procedure: Attempted thrombectomy of left arm arteriovenous goretex graft.;  Surgeon: Angelia Mould, MD;  Location: [place];  Service: Vascular;  Laterality: Left;  . Insertion of dialysis catheter N/A 03/26/2013    Procedure: INSERTION OF DIALYSIS CATHETER Left internal jugular vein;  Surgeon: [name]  Scot Dock, MD;  Location: [place];  Service: Vascular;  Laterality: N/A;  . Avgg removal Left 03/26/2013    Procedure: REMOVAL OF  NON INCORPORATED ARTERIOVENOUS GORETEX GRAFT ([place]) left arm * repair of  left brachial artery with vein patch angioplasty.;  Surgeon: Angelia Mould, MD;  Location: [place];  Service: Vascular;  Laterality: Left;  . Av fistula placement Left 05/05/2013    Procedure: INSERTION OF LEFT UPPER ARM  ARTERIOVENOUS GORTEX GRAFT;  Surgeon: Angelia Mould, MD;  Location: [place];  Service: Vascular;  Laterality: Left;  . Tee without cardioversion N/A 06/11/2013    Procedure: TRANSESOPHAGEAL ECHOCARDIOGRAM (TEE);  Surgeon: Larey Dresser, MD;   Location: [place];  Service: Cardiovascular;  Laterality: N/A;  . Embolectomy Right 08/27/2013    Procedure: EMBOLECTOMY;  Surgeon: Mal Misty, MD;  Location: [place];  Service: Vascular;  Laterality: Right;  Thrombectomy of Radial and ulnar artery.  . Cataract extraction w/ intraocular lens  implant, bilateral    . Colonoscopy    . Ligation of arteriovenous  fistula Right 12/30/2013    Procedure: REMOVAL OF SEGMENT OF GORTEX GRAFT AND FISTULA  AND REPAIR OF BRACHIAL ARTERY;  Surgeon: Mal Misty, MD;  Location: [place];  Service: Vascular;  Laterality: Right;  Annell Greening N/A 04/07/2013    Procedure: VENOGRAM;  Surgeon: Serafina Mitchell, MD;  Location: [facility] CATH LAB;  Service: Cardiovascular;  Laterality: N/A;    Assessment / Plan / Recommendation Clinical Impression  Ernest [name] is a 69 y.o. right handed male with history of end-stage renal disease, hypertension, seizure disorder, diabetes mellitus peripheral neuropathy, CAD with stenting and CVA with little residual weakness .  Presented 07/11/2015 with diplopia and slurred speech. Cranial CT scan with chronic ischemic white matter disease old lacunar infarct without acute intracranial abnormality. MRI not completed due to implant.  Patient was admitted for a comprehensive rehabilitation program on 07/13/2015.  SLP evaluation completed on 07/14/2015 with the following results:  Pt presents with mild dysarthria characterized by imprecise articulation of consonants resulting from impaired lingual coordination.  Pt is >75% intelligible in conversations with extra time and cues for overarticulation.  Pt reports speech is altered from baseline but endorses having residual dysarthria from previous CVAs.  Pt also presents with mild, intermittent word finding deficits which he reports to be different from baseline as well.  Cognitively, pt presents with poor delayed recall of new information.  SLP suspects many of the abovementioned deficits are baseline  given history of previous CVAs and evidence of chronic ischemic white matter disease on CT; however, given that pt reports worsening of deficits s/p acute CVA, he would benefit from brief ST follow up while inpatient 3x per week to address education and carryover of compensatory strategies for dysarthria, cognition, and word finding.  Do not anticipate that pt will need ST follow up at next level of care.  Recommend at least intermittent supervision for medication and financial management at discharge, which wife was providing prior to admission.    Skilled Therapeutic Interventions          Cognitive-linguistic evaluation completed with results and recommendations reviewed with patient. Pt educated regarding results of testing and plan of care to be addressed while inpatient.  All pt's questions were answered to his satisfaction at this time and pt was in agreement with treatment plan.      SLP Assessment  Patient will need skilled Speech Lanaguage Pathology Services during CIR admission  Recommendations  Patient destination: Home Follow up Recommendations: None Equipment Recommended: None recommended by SLP    SLP Frequency 1 to 3 out of 7 days   SLP Treatment/Interventions Cognitive remediation/compensation;Cueing hierarchy;Patient/family education;Functional tasks;Environmental controls;Internal/external aids   Pain Pain Assessment Pain Assessment: No/denies pain Prior Functioning Cognitive/Linguistic Baseline: Baseline deficits Baseline deficit details: speech and memory deficits from prior cvas Type of Home: House  Lives With: Spouse;Daughter Available Help at Discharge: Family Education: 6th grade Vocation: Retired  Function:  Eating Eating   Modified Consistency Diet: No Eating Assist Level: Set up assist for   Eating Set Up Assist For: Opening containers;Cutting food       Cognition Comprehension Comprehension assist level: Understands complex 90% of the time/cues 10%  of the time  Expression   Expression assist level: Expresses basic 90% of the time/requires cueing < 10% of the time.  Social Interaction Social Interaction assist level: Interacts appropriately with others - No medications needed.  Problem Solving Problem solving assist level: Solves basic 90% of the time/requires cueing < 10% of the time  Memory Memory assist level: Recognizes or recalls 75 - 89% of the time/requires cueing 10 - 24% of the time   Short Term Goals: Week 1: SLP Short Term Goal 1 (Week 1): STG=LTG due to ELOS   Refer to Care Plan for Long Term Goals  Recommendations for other services: None  Discharge Criteria: Patient will be discharged from SLP if patient refuses treatment 3 consecutive times without medical reason, if treatment goals not met, if there is a change in medical status, if patient makes no progress towards goals or if patient is discharged from hospital.  The above assessment, treatment plan, treatment alternatives and goals were discussed and mutually agreed upon: by patient  Emilio Math 07/14/2015, 3:42 PM

## 2015-07-14 NOTE — Care Management Note (Signed)
[facility] Individual Statement of Services  Patient Name:  Ernest [name]  Date:  07/14/2015  Welcome to the [facility].  Our goal is to provide you with an individualized program based on your diagnosis and situation, designed to meet your specific needs.  With this comprehensive rehabilitation program, you will be expected to participate in at least 3 hours of rehabilitation therapies Monday-Friday, with modified therapy programming on the weekends.  Your rehabilitation program will include the following services:  Physical Therapy (PT), Occupational Therapy (OT), Speech Therapy (ST), 24 hour per day rehabilitation nursing, Case Management (Social Worker), Rehabilitation Medicine, Nutrition Services and Pharmacy Services  Weekly team conferences will be held on Wednesday to discuss your progress.  Your Social Worker will talk with you frequently to get your input and to update you on team discussions.  Team conferences with you and your family in attendance may also be held.  Expected length of stay: 7-9 days  Overall anticipated outcome: mod/i level  Depending on your progress and recovery, your program may change. Your Social Worker will coordinate services and will keep you informed of any changes. Your Social Worker's name and contact numbers are listed  below.  The following services may also be recommended but are not provided by the [facility]:    [place] will be made to provide these services after discharge if needed.  Arrangements include referral to agencies that provide these services.  Your insurance has been verified to be:  [place] Your primary doctor is:  Specialist-Renal and Neurologist no primary MD  Pertinent information will be shared with your doctor and your insurance company.  Social Worker:  Ovidio Kin, [place] or ([phone]  Information discussed with and copy given to patient by: Elease Hashimoto, 07/14/2015, 11:30 AM

## 2015-07-14 NOTE — Evaluation (Signed)
Occupational Therapy Assessment and Plan  Patient Details  Name: Ernest [name] MRN: [id number] Date of Birth: 1946-09-17  OT Diagnosis: ataxia, disturbance of vision and muscle weakness (generalized) Rehab Potential: Rehab Potential (ACUTE ONLY): Good ELOS: 7-9 days   Today's Date: 07/14/2015 OT Individual Time: 6144-3154 OT Individual Time Calculation (min): 60 min     Problem List:  Patient Active Problem List   Diagnosis Date Noted  . Right pontine cerebrovascular accident 07/13/2015  . Stroke 07/11/2015  . Diplopia 07/11/2015  . Obstructive sleep apnea 06/16/2013  . Hyperlipidemia   . ESRD (end stage renal disease)   . Erectile dysfunction   . Obesity   . Hypertension   . Hepatitis C   . COPD (chronic obstructive pulmonary disease)   . Coronary artery disease   . Ejection fraction < 50%   . Chronic combined systolic and diastolic CHF (congestive heart failure)   . Cardiomyopathy, ischemic 06/10/2013  . Uncontrolled type 2 diabetes mellitus with end-stage renal disease 06/08/2013  . AODM 02/28/2009  . GOUT, UNSPECIFIED 02/28/2009  . EXUDATIVE RETINOPATHY 02/28/2009  . CKD (chronic kidney disease) stage V requiring chronic dialysis 02/28/2009    Past Medical History:  Past Medical History  Diagnosis Date  . Hyperlipidemia   . Retinopathy   . Gout   . ESRD (end stage renal disease)     Started HD in [place] in 2009, ESRD was due to DM. Moved to [facility] in Dec 2009 and now gets dialysis at [facility] on a MWF schedule.     . Erectile dysfunction     penile implant  . Abnormal stress test     s/p cath November 2013 with modest disease involving the ostial left main, proximal LAD, proximal RCA - do not appear to be hemodynamically signficant; mild LV dysfunction  . Obesity   . Hypertension   . Hepatitis C   . COPD (chronic obstructive pulmonary disease)   . Coronary artery disease     per cath report 2013  . Ejection fraction < 50%   . Chronic systolic CHF  (congestive heart failure)   . Bacteremia   . Shortness of breath     Hx: of with exertion  . Gout     Hx: of  . Stroke   . Type II or unspecified type diabetes mellitus without mention of complication, not stated as uncontrolled     adult onset   Past Surgical History:  Past Surgical History  Procedure Laterality Date  . Penile prosthesis implant      1997.. no card  . Fistula      RUE and wrist  . Cardiac catheterization  August 26, 2012  . Eye surgery      laser.  and surgery for DM  . Insertion of dialysis catheter Right 01/01/2013    Procedure: INSERTION OF DIALYSIS CATHETER right  internal jugular;  Surgeon: Serafina Mitchell, MD;  Location: [place];  Service: Vascular;  Laterality: Right;  . Av fistula placement Left 01/13/2013    Procedure: INSERTION OF ARTERIOVENOUS (AV) GORE-TEX GRAFT ARM;  Surgeon: Angelia Mould, MD;  Location: [place];  Service: Vascular;  Laterality: Left;  . Thrombectomy w/ embolectomy Left 03/26/2013    Procedure: Attempted thrombectomy of left arm arteriovenous goretex graft.;  Surgeon: Angelia Mould, MD;  Location: [place];  Service: Vascular;  Laterality: Left;  . Insertion of dialysis catheter N/A 03/26/2013    Procedure: INSERTION OF DIALYSIS CATHETER Left internal  jugular vein;  Surgeon: Angelia Mould, MD;  Location: [place];  Service: Vascular;  Laterality: N/A;  . Avgg removal Left 03/26/2013    Procedure: REMOVAL OF  NON INCORPORATED ARTERIOVENOUS GORETEX GRAFT ([place]) left arm * repair of  left brachial artery with vein patch angioplasty.;  Surgeon: Angelia Mould, MD;  Location: [place];  Service: Vascular;  Laterality: Left;  . Av fistula placement Left 05/05/2013    Procedure: INSERTION OF LEFT UPPER ARM  ARTERIOVENOUS GORTEX GRAFT;  Surgeon: Angelia Mould, MD;  Location: [place];  Service: Vascular;  Laterality: Left;  . Tee without cardioversion N/A 06/11/2013    Procedure: TRANSESOPHAGEAL ECHOCARDIOGRAM (TEE);   Surgeon: Larey Dresser, MD;  Location: [place];  Service: Cardiovascular;  Laterality: N/A;  . Embolectomy Right 08/27/2013    Procedure: EMBOLECTOMY;  Surgeon: Mal Misty, MD;  Location: [place];  Service: Vascular;  Laterality: Right;  Thrombectomy of Radial and ulnar artery.  . Cataract extraction w/ intraocular lens  implant, bilateral    . Colonoscopy    . Ligation of arteriovenous  fistula Right 12/30/2013    Procedure: REMOVAL OF SEGMENT OF GORTEX GRAFT AND FISTULA  AND REPAIR OF BRACHIAL ARTERY;  Surgeon: Mal Misty, MD;  Location: [place];  Service: Vascular;  Laterality: Right;  Annell Greening N/A 04/07/2013    Procedure: VENOGRAM;  Surgeon: Serafina Mitchell, MD;  Location: [facility] CATH LAB;  Service: Cardiovascular;  Laterality: N/A;    Assessment & Plan Clinical Impression: Patient is a 69 y.o. right handed male with history of end-stage renal disease with hemodialysis Monday Wednesday Friday at [facility], hypertension, seizure disorder maintained on Keppra, diabetes mellitus peripheral neuropathy, CAD with stenting and CVA with little residual weakness . Patient had been on aspirin and Plavix but ran out of aspirin was taking only Plavix. Presented 07/11/2015 with diplopia and slurred speech. Lives with spouse independent with single-point cane prior to admission. Wife is physically limited to provide assistance. Cranial CT scan with chronic ischemic white matter disease old lacunar infarct without acute intracranial abnormality. Echocardiogram with ejection fraction of 74% grade 1 diastolic dysfunction. Carotid Dopplers with no ICA stenosis. MRI of the brain not completed due to history of penile implant. Follow up cranial CT pending.. Neurology consulted presently remains on aspirin and Plavix. Subcutaneous heparin for DVT prophylaxis. Regular consistency diet. Physical and occupational therapy evaluation completed with recommendations of physical medicine rehabilitation  consult.   Patient transferred to CIR on 07/13/2015 .    Patient currently requires min with basic self-care skills secondary to muscle weakness, unbalanced muscle activation and ataxia, decreased visual motor skills, decreased memory and decreased standing balance, decreased postural control and decreased balance strategies.  Prior to hospitalization, patient could complete ADLs with modified independent .  Patient will benefit from skilled intervention to increase independence with basic self-care skills prior to discharge home with care partner.  Anticipate patient will require intermittent supervision and follow up outpatient.  OT - End of Session Activity Tolerance: Tolerates 30+ min activity without fatigue Endurance Deficit: No OT Assessment Rehab Potential (ACUTE ONLY): Good OT Patient demonstrates impairments in the following area(s): Balance;Endurance;Vision;Safety OT Basic ADL's Functional Problem(s): Grooming;Bathing;Dressing;Toileting OT Transfers Functional Problem(s): Toilet;Tub/Shower OT Additional Impairment(s): None OT Plan OT Intensity: Minimum of 1-2 x/day, 45 to 90 minutes OT Frequency: 5 out of 7 days OT Duration/Estimated Length of Stay: 7-9 days OT Treatment/Interventions: Balance/vestibular training;Cognitive remediation/compensation;Community reintegration;Discharge planning;Disease mangement/prevention;DME/adaptive equipment instruction;Functional mobility training;Neuromuscular  re-education;Pain management;Patient/family education;Psychosocial support;Self Care/advanced ADL retraining;Therapeutic Activities;Therapeutic Exercise;UE/LE Strength taining/ROM;UE/LE Coordination activities;Visual/perceptual remediation/compensation OT Self Feeding Anticipated Outcome(s): no goal OT Basic Self-Care Anticipated Outcome(s): Mod I OT Toileting Anticipated Outcome(s): Mod I OT Bathroom Transfers Anticipated Outcome(s): Mod I OT Recommendation Patient destination:  Home Follow Up Recommendations: Outpatient OT Equipment Recommended: None recommended by OT   Skilled Therapeutic Intervention OT eval completed with discussion of OT purpose, POC, ELOS, and goals.  ADL assessment completed at sit > stand level in room shower.  Pt completed all transfers with min/steady assist. Pt attempted to don pants in standing, educated on fall risk and recommended donning pants at sit > stand level.  Vision assessment with noted visual deficits in Lt eye with decreased horizontal tracking and decreased depth perception.    OT Evaluation Precautions/Restrictions  Precautions Precautions: Fall Precaution Comments: vision deficits Restrictions Weight Bearing Restrictions: No General   Vital Signs Therapy Vitals Temp: 98.6 F (37 C) Temp Source: Oral Pulse Rate: 66 Resp: 18 BP: (!) 175/59 mmHg Patient Position (if appropriate): Lying Oxygen Therapy SpO2: 97 % O2 Device: Not Delivered Pain Pain Assessment Pain Assessment: No/denies pain Home Living/Prior Functioning Home Living Family/patient expects to be discharged to:: Private residence Living Arrangements: Spouse/significant other Available Help at Discharge: Family Type of Home: House Home Access: Stairs to enter Technical brewer of Steps: 1 Home Layout: Two level, 1/2 bath on main level Alternate Level Stairs-Number of Steps: 14 Alternate Level Stairs-Rails: Left Bathroom Shower/Tub: Tub/shower unit (garden tub) Biochemist, clinical: Standard Additional Comments: pt has shower seat and grab bars in shower  Lives With: Spouse, Daughter IADL History Homemaking Responsibilities: No Current License: No Education: 6th grade Prior Function Level of Independence: Independent with basic ADLs, Independent with transfers, Independent with gait, Requires assistive device for independence  Able to Take Stairs?: Yes Driving: No Vocation: Retired Comments: pt was bathing, dressing, ambulating without  AD. Wife has a bad back and cannot physically assist pt, wife drives/pt would take SCAT to HD ADL  See Function Navigator Vision/Perception  Vision- History Baseline Vision/History: Wears glasses;Retinopathy Patient Visual Report: Blurring of vision (diplopia is resolving, depth perception impaired) Vision- Assessment Vision Assessment?: Yes Eye Alignment: Within Functional Limits Ocular Range of Motion: Restricted on the left Tracking/Visual Pursuits: Left eye does not track laterally;Left eye does not track medially Saccades: Undershoots Convergence: Impaired (comment) Diplopia Assessment: Disappears with one eye closed Depth Perception: Overshoots;Undershoots  Cognition Overall Cognitive Status: No family/caregiver present to determine baseline cognitive functioning Arousal/Alertness: Awake/alert Orientation Level: Person;Place;Situation Person: Oriented Place: Oriented Situation: Oriented Year: 2016 Month: September Day of Week: Correct Immediate Memory Recall: Sock;Blue;Bed Memory Recall: Sock;Blue (stated "red" for "bed" but was unable to recall "bed" with cue) Memory Recall Sock: Without Cue Memory Recall Blue: Without Cue Attention: Selective Selective Attention: Appears intact Awareness: Appears intact (aware of visual deficits) Problem Solving: Appears intact Safety/Judgment: Appears intact Sensation Sensation Light Touch: Impaired Detail Light Touch Impaired Details: Impaired LLE;Impaired RLE (stocking glove in BLE with reports of numbness in feet) Stereognosis: Not tested Hot/Cold: Appears Intact Proprioception: Appears Intact Coordination Fine Motor Movements are Fluid and Coordinated: Yes Finger Nose Finger Test: overshoots, most likely attributed to vision as this improves with Lt eye closed Extremity/Trunk Assessment RUE Assessment RUE Assessment: Within Functional Limits (ROM WFL, strength grossly 4+/5) LUE Assessment LUE Assessment: Within Functional  Limits (ROM WFL, strength grossly 4+/5)   See Function Navigator for Current Functional Status.   Refer to Care Plan for Long Term Goals  Recommendations for other services: None  Discharge Criteria: Patient will be discharged from OT if patient refuses treatment 3 consecutive times without medical reason, if treatment goals not met, if there is a change in medical status, if patient makes no progress towards goals or if patient is discharged from hospital.  The above assessment, treatment plan, treatment alternatives and goals were discussed and mutually agreed upon: by patient  [name] 07/14/2015, 8:51 AM

## 2015-07-14 NOTE — Progress Notes (Addendum)
[place] PHYSICAL MEDICINE & REHABILITATION     PROGRESS NOTE    Subjective/Complaints: Had a good night. Denies pain. Able to sleep. Ready for therapy. Still having double vision. ROS: Pt denies fever, rash/itching, headache,  nausea, vomiting, abdominal pain, diarrhea, chest pain, shortness of breath, palpitations, dysuria, dizziness, neck or back pain, bleeding, anxiety, or depression   Objective: Vital Signs: Blood pressure 175/59, pulse 66, temperature 98.6 F (37 C), temperature source Oral, resp. rate 18, height 5.9" (0.15 m), weight 98.204 kg (216 lb 8 oz), SpO2 97 %. Ct Head Wo Contrast  07/13/2015   CLINICAL DATA:  Probable stroke. The patient is unable to move his right non. Double vision beginning 2 a.m. this morning. Difficulty walking, new.  EXAM: CT HEAD WITHOUT CONTRAST  TECHNIQUE: Contiguous axial images were obtained from the base of the skull through the vertex without intravenous contrast.  COMPARISON:  Head CT scan 07/11/2015 and 04/25/2015.  FINDINGS: Cortical atrophy and chronic microvascular ischemic change are again seen. Small, remote lacunar infarction right cerebellum also again seen. No evidence of acute intracranial abnormality including hemorrhage, infarct, mass lesion, mass effect, midline shift or abnormal extra-axial fluid collection is identified. The calvarium is intact. No hydrocephalus or pneumocephalus. Small amount of fluid in the mastoid air cells bilaterally is identified. Dense atherosclerotic vascular disease is seen.  IMPRESSION: No acute abnormality.  Atrophy and chronic microvascular ischemic change.  Small amount of fluid in mastoid air cells bilaterally.   Electronically Signed   By: Inge Rise M.D.   On: 07/13/2015 15:12    Recent Labs  07/13/15 0812 07/13/15 1735  WBC 6.5 7.2  HGB 11.7* 12.0*  HCT 35.1* 36.2*  PLT 162 162    Recent Labs  07/13/15 0812 07/13/15 1735  NA 136 134*  K 4.0 4.1  CL 92* 94*  GLUCOSE 82 181*  BUN  34* 16  CREATININE 9.80* 5.88*  CALCIUM 8.8* 8.6*   CBG (last 3)   Recent Labs  07/14/15 0012 07/14/15 0358 07/14/15 0827  GLUCAP 121* 86 81    Wt Readings from Last 3 Encounters:  07/13/15 98.204 kg (216 lb 8 oz)  07/13/15 95.8 kg (211 lb 3.2 oz)  07/06/15 102.059 kg (225 lb)    Physical Exam:  Constitutional: He appears well-developed and well-nourished.  HENT: oral mucosa pink and moist.  Head: Normocephalic and atraumatic.  Eyes: No scleral icterus.  Pupils reactive to light without nystagmus  Neck: Normal range of motion. Neck supple. No JVD present. No tracheal deviation present. No thyromegaly present.  Cardiovascular: Normal rate and regular rhythm. no murmurs or rubs Respiratory: Effort normal and breath sounds normal. No respiratory distress. No wheezes or rales GI: Soft. Bowel sounds are normal. He exhibits no distension.  Neurological: He is alert.  Has difficulty tracking to the left. Left CN6 palsy. ?also with left CN III palsy (inconsisten?)--can track vertically without issue. easily in other directions. Past points with both hands, left more than right. ?ataxic. No PD. Speech is a bit dysarthric but intelligible. Fair awareness of deficits. Follows commands. MMT: 4+ to 5/5 bilateral UE delt,bicep,tricep, wrist, fingers LE: 4/5 HF, KE and ADF/APF bilaterally. Decreased LT in both feet in stocking glove distribution. Fair insight and awareness.   Skin: Skin is warm and dry.  Psychiatric: He has a normal mood and affect. His behavior is normal   Assessment/Plan: 1. Functional deficits secondary to right pontine infarct which require 3+ hours per day of interdisciplinary therapy in  a comprehensive inpatient rehab setting. Physiatrist is providing close team supervision and 24 hour management of active medical problems listed below. Physiatrist and rehab team continue to assess barriers to discharge/monitor patient progress toward functional and medical  goals.  Function:  Bathing Bathing position   Position: Shower  Bathing parts Body parts bathed by patient: Right arm, Left arm, Chest, Abdomen, Front perineal area, Buttocks, Right upper leg, Left upper leg, Right lower leg, Left lower leg, Back    Bathing assist Assist Level: Touching or steadying assistance(Pt > 75%)      Upper Body Dressing/Undressing Upper body dressing   What is the patient wearing?: Pull over shirt/dress     Pull over shirt/dress - Perfomed by patient: Thread/unthread right sleeve, Thread/unthread left sleeve, Put head through opening, Pull shirt over trunk          Upper body assist Assist Level: Set up   Set up : To obtain clothing/put away  Lower Body Dressing/Undressing Lower body dressing   What is the patient wearing?: Underwear, Pants, Socks, Shoes Underwear - Performed by patient: Thread/unthread right underwear leg, Thread/unthread left underwear leg, Pull underwear up/down   Pants- Performed by patient: Thread/unthread right pants leg, Thread/unthread left pants leg, Pull pants up/down, Fasten/unfasten pants       Socks - Performed by patient: Don/doff right sock, Don/doff left sock   Shoes - Performed by patient: Don/doff right shoe, Don/doff left shoe            Lower body assist Assist Level: Touching or steadying assistance (Pt > 75%)      Toileting Toileting   Toileting steps completed by patient: Adjust clothing prior to toileting, Performs perineal hygiene, Adjust clothing after toileting   Toileting Assistive Devices: Grab bar or rail  Toileting assist Assist level: Touching or steadying assistance (Pt.75%)   Transfers Chair/bed transfer   Chair/bed transfer method: Stand pivot Chair/bed transfer assist level: Touching or steadying assistance (Pt > 75%) Chair/bed transfer assistive device: Armrests     Locomotion Ambulation     Max distance: 220 Assist level: Touching or steadying assistance (Pt > 75%)    Wheelchair   Type: Manual Max wheelchair distance: 150 Assist Level: Supervision or verbal cues  Cognition Comprehension Comprehension assist level: Understands basic 90% of the time/cues < 10% of the time  Expression Expression assist level: Expresses basic 90% of the time/requires cueing < 10% of the time.  Social Interaction Social Interaction assist level: Interacts appropriately with others - No medications needed.  Problem Solving Problem solving assist level: Solves basic 90% of the time/requires cueing < 10% of the time  Memory Memory assist level: Recognizes or recalls 90% of the time/requires cueing < 10% of the time   Medical Problem List and Plan: 1. Functional deficits secondary to pontine infarct with diplopia, speech, and balance deficits  -begin therapies  -follow up with neuro regarding potential location of infarct, ?INO 2. DVT Prophylaxis/Anticoagulation: Subcutaneous heparin. Monitor platelet counts and any signs of bleeding 3. Pain Management: Tylenol as needed 4. End-stage renal disease. Continue hemodialysis as per renal services. HD will be after therapies to avoid conflict with therapy day 5. Neuropsych: This patient is capable of making decisions on his own behalf. 6. Skin/Wound Care: Routine skin checks. Encourage PO  7. Fluids/Electrolytes/Nutrition: Routine I&O's recent labs personally reviewed and stable. Potassium now nl. 8. Seizure disorder. Keppra 1000 mg daily. Takes extra 500 mg tablet Monday Wednesday Friday after dialysis.   -no current seizure  activity 9. Diabetes mellitus of peripheral neuropathy. Hemoglobin A1c 7.7. Insulin NPH 8 units twice a day.   -follow for pattern  -some variability at present 10. History of gout. Continue Zyloprim daily, colchicine 0.6 mg daily. Follow for gout flareups 11. Hyperlipidemia. Zocor   LOS (Days) 1 A FACE TO FACE EVALUATION WAS PERFORMED  SWARTZ,ZACHARY T 07/14/2015 10:58 AM

## 2015-07-14 NOTE — Progress Notes (Signed)
Patient information reviewed and entered into eRehab system by Marie Noel, RN, CRRN, PPS Coordinator.  Information including medical coding and functional independence measure will be reviewed and updated through discharge.     Per nursing patient was given "Data Collection Information Summary for Patients in Inpatient Rehabilitation Facilities with attached "Privacy Act Statement-Health Care Records" upon admission.  

## 2015-07-14 NOTE — Progress Notes (Signed)
S: working with rehab.  No new CP O:BP 175/59 mmHg  Pulse 66  Temp(Src) 98.6 F (37 C) (Oral)  Resp 18  Ht 5.9" (0.15 m)  Wt 98.204 kg (216 lb 8 oz)  BMI 4364.62 kg/m2  SpO2 97%  Intake/Output Summary (Last 24 hours) at 07/14/15 1157 Last data filed at 07/13/15 1718  Gross per 24 hour  Intake    240 ml  Output      0 ml  Net    240 ml   Weight change:  Gen: awake and alert CVS:RRR Resp:Clear Abd:+ BS NTND Ext:no edema  LUA AVG + bruit NEURO:CNI Ox3 no asterixis   . allopurinol  100 mg Oral Daily  . amitriptyline  25 mg Oral QHS  . antiseptic oral rinse  7 mL Mouth Rinse q12n4p  . aspirin EC  81 mg Oral Daily  . chlorhexidine  15 mL Mouth Rinse BID  . cinacalcet  60 mg Oral BID WC  . clopidogrel  75 mg Oral Daily  . colchicine  0.6 mg Oral Daily  . feeding supplement (NEPRO CARB STEADY)  237 mL Oral BID BM  . heparin  5,000 Units Subcutaneous 3 times per day  . insulin aspart  0-9 Units Subcutaneous TID WC  . insulin NPH Human  8 Units Subcutaneous BID AC & HS  . levETIRAcetam  1,000 mg Oral Daily  . levETIRAcetam  500 mg Oral Q M,W,F-2000  . multivitamin  1 tablet Oral QHS  . simvastatin  20 mg Oral q1800   Ct Head Wo Contrast  07/13/2015   CLINICAL DATA:  Probable stroke. The patient is unable to move his right non. Double vision beginning 2 a.m. this morning. Difficulty walking, new.  EXAM: CT HEAD WITHOUT CONTRAST  TECHNIQUE: Contiguous axial images were obtained from the base of the skull through the vertex without intravenous contrast.  COMPARISON:  Head CT scan 07/11/2015 and 04/25/2015.  FINDINGS: Cortical atrophy and chronic microvascular ischemic change are again seen. Small, remote lacunar infarction right cerebellum also again seen. No evidence of acute intracranial abnormality including hemorrhage, infarct, mass lesion, mass effect, midline shift or abnormal extra-axial fluid collection is identified. The calvarium is intact. No hydrocephalus or  pneumocephalus. Small amount of fluid in the mastoid air cells bilaterally is identified. Dense atherosclerotic vascular disease is seen.  IMPRESSION: No acute abnormality.  Atrophy and chronic microvascular ischemic change.  Small amount of fluid in mastoid air cells bilaterally.   Electronically Signed   By: Inge Rise M.D.   On: 07/13/2015 15:12   BMET    Component Value Date/Time   NA 134* 07/13/2015 1735   K 4.1 07/13/2015 1735   CL 94* 07/13/2015 1735   CO2 33* 07/13/2015 1735   GLUCOSE 181* 07/13/2015 1735   BUN 16 07/13/2015 1735   CREATININE 5.88* 07/13/2015 1735   CALCIUM 8.6* 07/13/2015 1735   GFRNONAA 9* 07/13/2015 1735   GFRAA 10* 07/13/2015 1735   CBC    Component Value Date/Time   WBC 7.2 07/13/2015 1735   RBC 4.22 07/13/2015 1735   HGB 12.0* 07/13/2015 1735   HCT 36.2* 07/13/2015 1735   PLT 162 07/13/2015 1735   MCV 85.8 07/13/2015 1735   MCH 28.4 07/13/2015 1735   MCHC 33.1 07/13/2015 1735   RDW 14.9 07/13/2015 1735   LYMPHSABS 1.5 07/11/2015 1357   MONOABS 0.7 07/11/2015 1357   EOSABS 0.2 07/11/2015 1357   BASOSABS 0.1 07/11/2015 1357     Assessment:  1.  Pontine stroke 2. ESRD 3. Anemia 4. Sec HPTH on calcitriol 5. DM  Plan: 1. Plan HD tomorrow   MATTINGLY,MICHAEL T

## 2015-07-14 NOTE — Progress Notes (Signed)
Initial Nutrition Assessment  DOCUMENTATION CODES:   Obesity unspecified  INTERVENTION:  Continue Nepro Shake po BID, each supplement provides 425 kcal and 19 grams protein Monitor PO intake for adequacy   NUTRITION DIAGNOSIS:   Increased nutrient needs related to chronic illness as evidenced by estimated needs.   GOAL:   Patient will meet greater than or equal to 90% of their needs   MONITOR:   PO intake, Labs, Weight trends, Skin, I & O's  REASON FOR ASSESSMENT:   Malnutrition Screening Tool    ASSESSMENT:   69 y.o. right handed male with history of end-stage renal disease with hemodialysis MWF, hypertension, seizure disorder maintained on Keppra, diabetes mellitus peripheral neuropathy, CAD with stenting and CVA with little residual weakness . Presented 07/11/2015 with diplopia and slurred speech  Pt fast asleep at time of visit. Per nursing notes, pt ate 100% of breakfast this morning. Weight history shows that patient was maintaining 245 lbs over the past year with 12% weight loss within the past 2.5 months, now at 216 lbs.  Pt's estimated energy needs will not be met by Renal/Carb Modified diet; will continue Nepro Shakes BID to meet increased nutrient needs. Full nutrition assessment to follow when pt is available.   Labs: low chloride, low calcium, low hemoglobin, high creatinine, low phosphorus, low GFR  Diet Order:  Diet renal/carb modified with fluid restriction Diet-HS Snack?: Nothing; Room service appropriate?: Yes; Fluid consistency:: Thin  Skin:  Wound (see comment) (skin tear on left ankle)  Last BM:  9/29  Height:   Ht Readings from Last 1 Encounters:  07/13/15 5.9" (0.15 m)    Weight:   Wt Readings from Last 1 Encounters:  07/13/15 216 lb 8 oz (98.204 kg)    Ideal Body Weight:  72.7 kg  BMI:  Body mass index is 31.89 kg/(m^2).  Estimated Nutritional Needs:   Kcal:  7014-1030  Protein:  105-115 grams  Fluid:  2.2 L/day  EDUCATION  NEEDS:   No education needs identified at this time  [place], LDN Inpatient Clinical Dietitian Pager: [phone] After Hours Pager: [phone]

## 2015-07-14 NOTE — Progress Notes (Signed)
Social Work Assessment and Plan Social Work Assessment and Plan  Patient Details  Name: Ernest [name] MRN: [id number] Date of Birth: [birth year]  Today's Date: 07/14/2015  Problem List:  Patient Active Problem List   Diagnosis Date Noted  . Right pontine cerebrovascular accident 07/13/2015  . Stroke 07/11/2015  . Diplopia 07/11/2015  . Obstructive sleep apnea 06/16/2013  . Hyperlipidemia   . ESRD (end stage renal disease)   . Erectile dysfunction   . Obesity   . Hypertension   . Hepatitis C   . COPD (chronic obstructive pulmonary disease)   . Coronary artery disease   . Ejection fraction < 50%   . Chronic combined systolic and diastolic CHF (congestive heart failure)   . Cardiomyopathy, ischemic 06/10/2013  . Uncontrolled type 2 diabetes mellitus with end-stage renal disease 06/08/2013  . AODM 02/28/2009  . GOUT, UNSPECIFIED 02/28/2009  . EXUDATIVE RETINOPATHY 02/28/2009  . CKD (chronic kidney disease) stage V requiring chronic dialysis 02/28/2009   Past Medical History:  Past Medical History  Diagnosis Date  . Hyperlipidemia   . Retinopathy   . Gout   . ESRD (end stage renal disease)     Started HD in [place] in 2009, ESRD was due to DM. Moved to [facility] in Dec 2009 and now gets dialysis at [facility] on a MWF schedule.     . Erectile dysfunction     penile implant  . Abnormal stress test     s/p cath November 2013 with modest disease involving the ostial left main, proximal LAD, proximal RCA - do not appear to be hemodynamically signficant; mild LV dysfunction  . Obesity   . Hypertension   . Hepatitis C   . COPD (chronic obstructive pulmonary disease)   . Coronary artery disease     per cath report 2013  . Ejection fraction < 50%   . Chronic systolic CHF (congestive heart failure)   . Bacteremia   . Shortness of breath     Hx: of with exertion  . Gout     Hx: of  . Stroke   . Type II or unspecified type diabetes mellitus without mention of complication, not  stated as uncontrolled     adult onset   Past Surgical History:  Past Surgical History  Procedure Laterality Date  . Penile prosthesis implant      1997.. no card  . Fistula      RUE and wrist  . Cardiac catheterization  August 26, 2012  . Eye surgery      laser.  and surgery for DM  . Insertion of dialysis catheter Right 01/01/2013    Procedure: INSERTION OF DIALYSIS CATHETER right  internal jugular;  Surgeon: Serafina Mitchell, MD;  Location: [place];  Service: Vascular;  Laterality: Right;  . Av fistula placement Left 01/13/2013    Procedure: INSERTION OF ARTERIOVENOUS (AV) GORE-TEX GRAFT ARM;  Surgeon: Angelia Mould, MD;  Location: [place];  Service: Vascular;  Laterality: Left;  . Thrombectomy w/ embolectomy Left 03/26/2013    Procedure: Attempted thrombectomy of left arm arteriovenous goretex graft.;  Surgeon: Angelia Mould, MD;  Location: [place];  Service: Vascular;  Laterality: Left;  . Insertion of dialysis catheter N/A 03/26/2013    Procedure: INSERTION OF DIALYSIS CATHETER Left internal jugular vein;  Surgeon: Angelia Mould, MD;  Location: [place];  Service: Vascular;  Laterality: N/A;  . Avgg removal Left 03/26/2013    Procedure: REMOVAL OF  NON  INCORPORATED ARTERIOVENOUS GORETEX GRAFT ([place]) left arm * repair of  left brachial artery with vein patch angioplasty.;  Surgeon: Angelia Mould, MD;  Location: [place];  Service: Vascular;  Laterality: Left;  . Av fistula placement Left 05/05/2013    Procedure: INSERTION OF LEFT UPPER ARM  ARTERIOVENOUS GORTEX GRAFT;  Surgeon: Angelia Mould, MD;  Location: [place];  Service: Vascular;  Laterality: Left;  . Tee without cardioversion N/A 06/11/2013    Procedure: TRANSESOPHAGEAL ECHOCARDIOGRAM (TEE);  Surgeon: Larey Dresser, MD;  Location: [place];  Service: Cardiovascular;  Laterality: N/A;  . Embolectomy Right 08/27/2013    Procedure: EMBOLECTOMY;  Surgeon: Mal Misty, MD;  Location: [place];  Service:  Vascular;  Laterality: Right;  Thrombectomy of Radial and ulnar artery.  . Cataract extraction w/ intraocular lens  implant, bilateral    . Colonoscopy    . Ligation of arteriovenous  fistula Right 12/30/2013    Procedure: REMOVAL OF SEGMENT OF GORTEX GRAFT AND FISTULA  AND REPAIR OF BRACHIAL ARTERY;  Surgeon: Mal Misty, MD;  Location: [place];  Service: Vascular;  Laterality: Right;  Annell Greening N/A 04/07/2013    Procedure: VENOGRAM;  Surgeon: Serafina Mitchell, MD;  Location: [facility] CATH LAB;  Service: Cardiovascular;  Laterality: N/A;   Social History:  reports that he quit smoking about 42 years ago. He has never used smokeless tobacco. He reports that he does not drink alcohol or use illicit drugs.  Family / Support Systems Marital Status: Married Patient Roles: Spouse, Parent Spouse/Significant Other: [name] [phone]-cell Children: daughter currenlty living with them, but plans to move out soon Other Supports: Friends Anticipated Caregiver: Wife  Ability/Limitations of Caregiver: Wife has physical issues and can only provide supervision level at discharge-she does drive  Caregiver Availability: 24/7 Family Dynamics: Close knit family have been here since 2009 from [place].  Pt wants to get a s high levle as possible due to does not want to burden his wife, she has enough on her.  Daughter does help but is moving soon.  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   Abuse/Neglect Physical Abuse: Denies Verbal Abuse: Denies Sexual Abuse: Denies Exploitation of patient/patient's resources: Denies Self-Neglect: Denies  Emotional Status Pt's affect, behavior adn adjustment status: Pt is  motivated and feels like his first day on rehab went well. He is tired but a good tired. He is not sure if his wife is coming today to visit. He has always been independent and wants to remain so.  He is a Scientist, research (physical sciences) and will do what is needed to recover from this stroke. Recent Psychosocial Issues: Other health issues-has a history of CVA's. Pyschiatric History: No history deferred depression screen due to tp is cpoign appropriately and feels he is doing well with his progress. Will monitor and make referral to neuro-psych is necessary. Substance Abuse History: No issues  Patient / Family Perceptions, Expectations & Goals Pt/Family understanding of illness & functional limitations: Pt and wife have a good understanding of his stroke and deficits.  He had speech deficits with his prior stroke, but now is a little worse. Both speak with the MD and feel his questions are being addressed.  Main issue is to recover from this stroke. Premorbid pt/family roles/activities:  husband, father, grandfather, retiree, dialysis pt, church member, etc Anticipated changes in roles/activities/participation: resume Pt/family expectations/goals: Pt states: " I want to get better and do for myself."  Wife states: " I hope he does well here, because I can't help him."  [employer]: Other (Comment) (HD M-W-F [place] [employer]) Premorbid Home Care/DME Agencies: Other (Comment) (had in the past) Transportation available at discharge: Wife and takes SCAT to HD Resource referrals recommended: Support group (specify)  Discharge Planning Living Arrangements: Spouse/significant other, Children Support Systems: Spouse/significant other, Children, Friends/neighbors, Church/faith community Type of Residence: Private residence Insurance Resources: [employer], Multimedia programmer (specify) Nurse, mental health) Financial Resources: SSD Financial Screen Referred: No Living Expenses: Lives with family Money Management: Spouse,  Patient Does the patient have any problems obtaining your medications?: No Home Management: All help one another-daughter and pt do most of home management Patient/Family Preliminary Plans: Return home with wife and daughter for a short time.  Wife can be there with him but not provide physical care, due to her own health issues. Will await team's evaluations and come up with a safe discharge plan. Social Work Anticipated Follow Up Needs: HH/OP, Support Group  Clinical Impression Pleasant gentleman who is motivated to do well and is having a good first day on rehab.  His wife is supportive along with their daughter.  Pt will need to be mod/i-supervision level for wife to be able to handle him at home. Will work on discharge needs and come up with a safe discharge plan, pt should do well here.  Elease Hashimoto 07/14/2015, 3:13 PM

## 2015-07-14 NOTE — Progress Notes (Signed)
No BMx3 days. Pt agrees to take medication in AM so he will not be awake throughout the night. No complaints of constipation. Kennieth Francois, RN

## 2015-07-14 NOTE — Evaluation (Signed)
Physical Therapy Assessment and Plan  Patient Details  Name: Ernest [name] MRN: [id number] Date of Birth: 24-Apr-1946  PT Diagnosis: Abnormality of gait, Difficulty walking, Impaired sensation and Muscle weakness Rehab Potential: Excellent ELOS: 7-9 days   Today's Date: 07/14/2015 PT Individual Time: 0900-1025 PT Individual Time Calculation (min): 85 min    Problem List:  Patient Active Problem List   Diagnosis Date Noted  . Right pontine cerebrovascular accident 07/13/2015  . Stroke 07/11/2015  . Diplopia 07/11/2015  . Obstructive sleep apnea 06/16/2013  . Hyperlipidemia   . ESRD (end stage renal disease)   . Erectile dysfunction   . Obesity   . Hypertension   . Hepatitis C   . COPD (chronic obstructive pulmonary disease)   . Coronary artery disease   . Ejection fraction < 50%   . Chronic combined systolic and diastolic CHF (congestive heart failure)   . Cardiomyopathy, ischemic 06/10/2013  . Uncontrolled type 2 diabetes mellitus with end-stage renal disease 06/08/2013  . AODM 02/28/2009  . GOUT, UNSPECIFIED 02/28/2009  . EXUDATIVE RETINOPATHY 02/28/2009  . CKD (chronic kidney disease) stage V requiring chronic dialysis 02/28/2009    Past Medical History:  Past Medical History  Diagnosis Date  . Hyperlipidemia   . Retinopathy   . Gout   . ESRD (end stage renal disease)     Started HD in [place] in 2009, ESRD was due to DM. Moved to [facility] in Dec 2009 and now gets dialysis at [facility] on a MWF schedule.     . Erectile dysfunction     penile implant  . Abnormal stress test     s/p cath November 2013 with modest disease involving the ostial left main, proximal LAD, proximal RCA - do not appear to be hemodynamically signficant; mild LV dysfunction  . Obesity   . Hypertension   . Hepatitis C   . COPD (chronic obstructive pulmonary disease)   . Coronary artery disease     per cath report 2013  . Ejection fraction < 50%   . Chronic systolic CHF (congestive heart  failure)   . Bacteremia   . Shortness of breath     Hx: of with exertion  . Gout     Hx: of  . Stroke   . Type II or unspecified type diabetes mellitus without mention of complication, not stated as uncontrolled     adult onset   Past Surgical History:  Past Surgical History  Procedure Laterality Date  . Penile prosthesis implant      1997.. no card  . Fistula      RUE and wrist  . Cardiac catheterization  August 26, 2012  . Eye surgery      laser.  and surgery for DM  . Insertion of dialysis catheter Right 01/01/2013    Procedure: INSERTION OF DIALYSIS CATHETER right  internal jugular;  Surgeon: Serafina Mitchell, MD;  Location: [place];  Service: Vascular;  Laterality: Right;  . Av fistula placement Left 01/13/2013    Procedure: INSERTION OF ARTERIOVENOUS (AV) GORE-TEX GRAFT ARM;  Surgeon: Angelia Mould, MD;  Location: [place];  Service: Vascular;  Laterality: Left;  . Thrombectomy w/ embolectomy Left 03/26/2013    Procedure: Attempted thrombectomy of left arm arteriovenous goretex graft.;  Surgeon: Angelia Mould, MD;  Location: [place];  Service: Vascular;  Laterality: Left;  . Insertion of dialysis catheter N/A 03/26/2013    Procedure: INSERTION OF DIALYSIS CATHETER Left internal jugular vein;  Surgeon: Angelia Mould, MD;  Location: [place];  Service: Vascular;  Laterality: N/A;  . Avgg removal Left 03/26/2013    Procedure: REMOVAL OF  NON INCORPORATED ARTERIOVENOUS GORETEX GRAFT ([place]) left arm * repair of  left brachial artery with vein patch angioplasty.;  Surgeon: Angelia Mould, MD;  Location: [place];  Service: Vascular;  Laterality: Left;  . Av fistula placement Left 05/05/2013    Procedure: INSERTION OF LEFT UPPER ARM  ARTERIOVENOUS GORTEX GRAFT;  Surgeon: Angelia Mould, MD;  Location: [place];  Service: Vascular;  Laterality: Left;  . Tee without cardioversion N/A 06/11/2013    Procedure: TRANSESOPHAGEAL ECHOCARDIOGRAM (TEE);  Surgeon: Larey Dresser,  MD;  Location: [place];  Service: Cardiovascular;  Laterality: N/A;  . Embolectomy Right 08/27/2013    Procedure: EMBOLECTOMY;  Surgeon: Mal Misty, MD;  Location: [place];  Service: Vascular;  Laterality: Right;  Thrombectomy of Radial and ulnar artery.  . Cataract extraction w/ intraocular lens  implant, bilateral    . Colonoscopy    . Ligation of arteriovenous  fistula Right 12/30/2013    Procedure: REMOVAL OF SEGMENT OF GORTEX GRAFT AND FISTULA  AND REPAIR OF BRACHIAL ARTERY;  Surgeon: Mal Misty, MD;  Location: [place];  Service: Vascular;  Laterality: Right;  Annell Greening N/A 04/07/2013    Procedure: VENOGRAM;  Surgeon: Serafina Mitchell, MD;  Location: [facility] CATH LAB;  Service: Cardiovascular;  Laterality: N/A;    Assessment & Plan Clinical Impression: Ernest [name] is a 69 y.o. right handed male with history of end-stage renal disease with hemodialysis Monday Wednesday Friday at [facility], hypertension, seizure disorder maintained on Keppra, diabetes mellitus peripheral neuropathy, CAD with stenting and CVA with little residual weakness . Patient had been on aspirin and Plavix but ran out of aspirin was taking only Plavix. Presented 07/11/2015 with diplopia and slurred speech. Lives with spouse independent with single-point cane prior to admission. Wife is physically limited to provide assistance. Cranial CT scan with chronic ischemic white matter disease old lacunar infarct without acute intracranial abnormality. Echocardiogram with ejection fraction of 76% grade 1 diastolic dysfunction. Carotid Dopplers with no ICA stenosis. MRI of the brain not completed due to history of penile implant. CT angiogram head and neck are pending. Neurology consulted presently remains on aspirin and Plavix. Subcutaneous heparin for DVT prophylaxis. Regular consistency diet. Physical and occupational therapy evaluation completed with recommendations of physical medicine rehabilitation consult.  Patient was admitted for a comprehensive rehabilitation program.  Patient transferred to CIR on 07/13/2015 .   Patient currently requires min with mobility secondary to muscle weakness and decreased standing balance, decreased postural control and decreased balance strategies.  Prior to hospitalization, patient was modified independent  with mobility and lived with Spouse, Daughter (reports daughter had been planning to move out this month, as of now plan is not changing) in a House home.  Home access is 1Stairs to enter.  Patient will benefit from skilled PT intervention to maximize safe functional mobility, minimize fall risk and decrease caregiver burden for planned discharge home with 24 hour supervision.  Anticipate patient will benefit from follow up [place] at discharge.  PT - End of Session Activity Tolerance: Tolerates 30+ min activity without fatigue Endurance Deficit: No PT Assessment Rehab Potential (ACUTE/IP ONLY): Excellent Barriers to Discharge: Decreased caregiver support Barriers to Discharge Comments: wife unable to physically assist PT Patient demonstrates impairments in the following area(s): Balance;Perception;Safety;Endurance;Sensory;Motor PT Transfers Functional Problem(s): Bed  Mobility;Bed to Chair;Furniture;Car PT Locomotion Functional Problem(s): Ambulation;Stairs;Wheelchair Mobility PT Plan PT Intensity: Minimum of 1-2 x/day ,45 to 90 minutes PT Frequency: 5 out of 7 days PT Duration Estimated Length of Stay: 7-9 days PT Treatment/Interventions: Ambulation/gait training;Balance/vestibular training;Neuromuscular re-education;Functional mobility training;DME/adaptive equipment instruction;Discharge planning;Patient/family education;Stair training;Therapeutic Activities;Therapeutic Exercise;UE/LE Coordination activities;Wheelchair propulsion/positioning;UE/LE Strength taining/ROM;Visual/perceptual remediation/compensation;Disease management/prevention PT Transfers Anticipated  Outcome(s): mod I PT Locomotion Anticipated Outcome(s): mod I LRAD in supervised environment PT Recommendation Recommendations for Other Services: Speech consult Follow Up Recommendations: Home health PT Patient destination: Home Equipment Recommended: To be determined Equipment Details: pt has RW and SPC, likely will not need other equipment  Skilled Therapeutic Intervention Pt received seated in w/c with no c/o pain and agreeable to treatment. Initial PT evaluation performed and completed. Pt educated on role of rehab, daily schedule, use of call bell system for safety in room, estimated length of stay, and supervision/mod I goals; pt agreeable to all the above.  Instructed pt in all mobility as described below; pt requires minA overall and demonstrates significant impairments in balance. Patient demonstrates increased fall risk as noted by score of  22/56 on Berg Balance Scale. (<36= high risk for falls, close to 100%; 37-45 significant >80%; 46-51 moderate >50%; 52-55 lower >25%). Performed nustep x15 min on level 5 with BUE/BLE to improve aerobic endurance as well as LE/UE strengthening. Requires occasional short rest breaks. Pt returned to room and requested to use restroom. Performed gait into bathroom with RW and CGA. Transfer and toileting performed with supervision overall, use of grab bars, and increased time to perform. Pt returned to bed with supervision; remained supine in bed with alarm intact and all needs in reach at completion of session.    PT Evaluation Precautions/Restrictions Precautions Precautions: Fall Precaution Comments: vision deficits Restrictions Weight Bearing Restrictions: No General Chart Reviewed: Yes Response to Previous Treatment: Patient with no complaints from previous session. Family/Caregiver Present: No Vital Signs Pain Pain Assessment Pain Assessment: No/denies pain Pain Score: 0-No pain Home Living/Prior Functioning Home Living Available Help  at Discharge: Family Type of Home: House Home Access: Stairs to enter Technical brewer of Steps: 1 Entrance Stairs-Rails: None Home Layout: Two level;1/2 bath on main level Alternate Level Stairs-Number of Steps: 14 Alternate Level Stairs-Rails: Left Bathroom Shower/Tub: Tub/shower unit (garden tub) Bathroom Toilet: Standard Additional Comments: pt has shower seat and grab bars in shower  Lives With: Spouse;Daughter (reports daughter had been planning to move out this month, as of now plan is not changing) Prior Function Level of Independence: Independent with basic ADLs;Independent with transfers;Independent with gait;Requires assistive device for independence;Independent with homemaking with ambulation  Able to Take Stairs?: Yes Driving: No Vocation: Retired Leisure: Hobbies-yes (Comment) Comments: Pt enjoys running errands with wife, helping with housework, watching tv, gardening however did not plant this year Vision/Perception  Vision - Assessment Eye Alignment: Within Functional Limits Ocular Range of Motion: Restricted on the left Tracking/Visual Pursuits: Left eye does not track laterally;Left eye does not track medially Saccades: Undershoots Convergence: Impaired (comment) Diplopia Assessment: Disappears with one eye closed Perception Comments: WFL  Cognition Overall Cognitive Status: No family/caregiver present to determine baseline cognitive functioning Arousal/Alertness: Awake/alert Orientation Level: Oriented to person;Oriented to place;Oriented to situation;Disoriented to time Attention: Selective Selective Attention: Appears intact Memory: Impaired Memory Impairment: Storage deficit;Retrieval deficit;Decreased recall of new information Awareness: Appears intact Problem Solving: Appears intact Safety/Judgment: Appears intact Sensation Sensation Light Touch: Impaired Detail Light Touch Impaired Details: Impaired LLE;Impaired RLE (stocking distribution B  feet)  Stereognosis: Not tested Hot/Cold: Appears Intact Proprioception: Impaired Detail Proprioception Impaired Details: Impaired LLE;Impaired RLE Additional Comments: Inaccurate 5/10 RLE, 6/10 LLE Coordination Gross Motor Movements are Fluid and Coordinated: Yes Fine Motor Movements are Fluid and Coordinated: Yes Finger Nose Finger Test: overshoots, most likely attributed to vision as this improves with Lt eye closed Heel Shin Test: WNL Motor  Motor Motor: Within Functional Limits Motor - Skilled Clinical Observations: impaired balance, grossly coordinated and symmetrical strength WFL  Mobility Bed Mobility Bed Mobility: Supine to Sit;Sit to Supine Supine to Sit: 5: Supervision Supine to Sit Details: Verbal cues for precautions/safety Sit to Supine: 5: Supervision Sit to Supine - Details: Verbal cues for precautions/safety Transfers Transfers: Yes Stand Pivot Transfers: 4: Min assist Stand Pivot Transfer Details: Verbal cues for technique;Manual facilitation for weight bearing;Manual facilitation for weight shifting Stand Pivot Transfer Details (indicate cue type and reason): Initial transfer w/c >bed pt tripped feet over w/c wheel and required minA for stability to prevent LOB; otherwise CGA for all other transfers  Locomotion  Ambulation Ambulation: Yes Ambulation/Gait Assistance: 4: Min guard Ambulation Distance (Feet): 220 Feet Assistive device: Rolling walker Gait Gait: Yes Gait Pattern: Impaired Gait Pattern: Wide base of support;Poor foot clearance - right;Shuffle Gait velocity: 0.85 m/sec Stairs / Additional Locomotion Stairs: Yes Stairs Assistance: 4: Min assist Stairs Assistance Details: Verbal cues for precautions/safety;Verbal cues for sequencing;Verbal cues for technique Stairs Assistance Details (indicate cue type and reason): cues for step-to pattern, use of UEs on handrails Stair Management Technique: Two rails;Step to pattern;Forwards Number of Stairs:  8 Height of Stairs: 6 Architect: Yes Wheelchair Assistance: 5: Investment banker, operational Details: Verbal cues for Marketing executive: Both upper extremities Wheelchair Parts Management: Needs assistance Distance: 150  Trunk/Postural Assessment  Cervical Assessment Cervical Assessment: Within Water engineer Thoracic Assessment: Within Functional Limits Lumbar Assessment Lumbar Assessment: Within Functional Limits Postural Control Postural Control: Deficits on evaluation Righting Reactions: impaired ankle strategies for maintaining balance due to decreased sensation/proprioception BLEs Protective Responses: decreased power production and efficiency of stepping strategies to recover LOB  Balance Balance Balance Assessed: Yes Standardized Balance Assessment Standardized Balance Assessment: Berg Balance Test Berg Balance Test Sit to Stand: Able to stand  independently using hands Standing Unsupported: Able to stand 2 minutes with supervision Sitting with Back Unsupported but Feet Supported on Floor or Stool: Able to sit safely and securely 2 minutes Stand to Sit: Uses backs of legs against chair to control descent Transfers: Able to transfer with verbal cueing and /or supervision Standing Unsupported with Eyes Closed: Able to stand 3 seconds Standing Ubsupported with Feet Together: Needs help to attain position but able to stand for 30 seconds with feet together From Standing, Reach Forward with Outstretched Arm: Reaches forward but needs supervision From Standing Position, Pick up Object from Floor: Unable to pick up and needs supervision From Standing Position, Turn to Look Behind Over each Shoulder: Turn sideways only but maintains balance Turn 360 Degrees: Needs close supervision or verbal cueing Standing Unsupported, Alternately Place Feet on Step/Stool: Needs assistance to keep from falling or unable to  try Standing Unsupported, One Foot in Front: Loses balance while stepping or standing Standing on One Leg: Unable to try or needs assist to prevent fall Total Score: 22 Static Sitting Balance Static Sitting - Balance Support: No upper extremity supported;Feet supported Static Sitting - Level of Assistance: 6: Modified independent (Device/Increase time) Static Sitting - Comment/# of Minutes: x2 min EOB Dynamic Sitting  Balance Dynamic Sitting - Balance Support: No upper extremity supported;Feet supported;During functional activity Dynamic Sitting - Level of Assistance: 5: Stand by assistance Dynamic Sitting - Balance Activities: Forward lean/weight shifting;Reaching for objects;Reaching across midline Sitting balance - Comments: while tying shoes seated on EOB Static Standing Balance Static Standing - Level of Assistance: 5: Stand by assistance Static Standing - Comment/# of Minutes: x2 min in Berg Static Stance: Eyes closed Static Stance: Eyes Closed: min guard x20 sec before losing balance and opening eyes to maintain Dynamic Standing Balance Dynamic Standing - Balance Support: During functional activity;No upper extremity supported Dynamic Standing - Level of Assistance: 5: Stand by assistance Dynamic Standing - Balance Activities: Reaching for objects;Forward lean/weight shifting Extremity Assessment  RUE Assessment RUE Assessment: Within Functional Limits LUE Assessment LUE Assessment: Within Functional Limits RLE Assessment RLE Assessment: Within Functional Limits (grossly 4+/5 throughout) LLE Assessment LLE Assessment: Within Functional Limits (grossly 4+/5 throughout)   See Function Navigator for Current Functional Status.   Refer to Care Plan for Long Term Goals  Recommendations for other services: Other: speech  Discharge Criteria: Patient will be discharged from PT if patient refuses treatment 3 consecutive times without medical reason, if treatment goals not met, if  there is a change in medical status, if patient makes no progress towards goals or if patient is discharged from hospital.  The above assessment, treatment plan, treatment alternatives and goals were discussed and mutually agreed upon: by patient  Luberta Mutter 07/14/2015, 10:46 AM

## 2015-07-15 ENCOUNTER — Inpatient Hospital Stay (HOSPITAL_COMMUNITY): Payer: Medicare Other | Admitting: Occupational Therapy

## 2015-07-15 ENCOUNTER — Ambulatory Visit (HOSPITAL_COMMUNITY): Payer: Medicare Other | Admitting: Physical Therapy

## 2015-07-15 ENCOUNTER — Inpatient Hospital Stay (HOSPITAL_COMMUNITY): Payer: Medicare Other | Admitting: Speech Pathology

## 2015-07-15 DIAGNOSIS — E1129 Type 2 diabetes mellitus with other diabetic kidney complication: Secondary | ICD-10-CM | POA: Diagnosis not present

## 2015-07-15 DIAGNOSIS — Z992 Dependence on renal dialysis: Secondary | ICD-10-CM | POA: Diagnosis not present

## 2015-07-15 DIAGNOSIS — N186 End stage renal disease: Secondary | ICD-10-CM | POA: Diagnosis not present

## 2015-07-15 LAB — GLUCOSE, CAPILLARY
GLUCOSE-CAPILLARY: 119 mg/dL — AB (ref 65–99)
GLUCOSE-CAPILLARY: 132 mg/dL — AB (ref 65–99)
Glucose-Capillary: 146 mg/dL — ABNORMAL HIGH (ref 65–99)
Glucose-Capillary: 79 mg/dL (ref 65–99)
Glucose-Capillary: 117 mg/dL — ABNORMAL HIGH (ref 65–99)
Glucose-Capillary: 143 mg/dL — ABNORMAL HIGH (ref 65–99)

## 2015-07-15 MED ORDER — LISINOPRIL 20 MG PO TABS
20.0000 mg | ORAL_TABLET | Freq: Every day | ORAL | Status: DC
  Administered 2015-07-16 – 2015-07-21 (×6): 20 mg via ORAL
  Filled 2015-07-15 (×6): qty 1

## 2015-07-15 NOTE — Progress Notes (Signed)
Physical Therapy Session Note  Patient Details  Name: Ernest [name] MRN: [id number] Date of Birth: May 29, 1946  Today's Date: 07/15/2015 PT Individual Time: 1315-1400 PT Individual Time Calculation (min): 45 min   Short Term Goals: Week 1:  PT Short Term Goal 1 (Week 1): =LTG due to estimated length of stay  Skilled Therapeutic Interventions/Progress Updates:    Pt received seated in bed eating lunch; pt declines to being treatment until finishing lunch. Pt missed 15 minutes skilled PT. Gait with RW to therapy gym x150' with supervision overall, mild lateral trunk lean to R side when turning to L side however pt able to maintain balance without assistance. Stair training for 2 trials of 12 stairs using 2 hand rails and alternating pattern, supervision overall. Requires seated rest breaks between trials due to extensive fatigue. Furniture transfer with supervision, reduced eccentric control to low surface during stand >sit. Functional gait training in apartment kitchen using RW and close supervision. Pt cued to locate and retrieve items needed to set a dinner table. Required redirecting initially as pt began taking food out of first cabinet he opened. Cues for safety with RW and pt reminded to keep RW in front of body even when turning towards cabinets. Mild LOB posteriorly when backing up to open silverware drawer, however maintained balance with minA. Seated rest break due to fatigue, followed by pt returning items to cabinets to assess short term recall of item location requiring min cues. Pt ambulated back to room and returned to supine in bed with supervision. Left in bed with alarm intact and all needs within reach at completion of session.   Therapy Documentation Precautions:  Precautions Precautions: Fall Precaution Comments: vision deficits Restrictions Weight Bearing Restrictions: No General: PT Amount of Missed Time (min): 15 Minutes PT Missed Treatment Reason: Other (Comment) (pt  declined participation until finishing lunch, late tray) Pain: Pain Assessment Pain Assessment: No/denies pain Pain Score: 0-No pain   See Function Navigator for Current Functional Status.   Therapy/Group: Individual Therapy  Luberta Mutter 07/15/2015, 1:57 PM

## 2015-07-15 NOTE — Progress Notes (Signed)
Occupational Therapy Session Note  Patient Details  Name: Ernest [name] MRN: [id number] Date of Birth: June 08, 1946  Today's Date: 07/15/2015 OT Individual Time: [id number] and [id number] OT Individual Time Calculation (min): 86 min and 30 min   Short Term Goals: Week 1:  OT Short Term Goal 1 (Week 1): STG = LTGs due to ELOS  Skilled Therapeutic Interventions/Progress Updates:    1) ADL retraining with focus on functional mobility, standing balance, and increased independence with self-care tasks.  Pt completed self-feeding seated at EOB with increased time, demonstrating improved problem solving with opening packets and cutting food.  Educated on hand placement with sit > stand to increase safety.  Pt completed bathroom transfers with min/steady assist with RW and bathing at overall supervision level.  Grooming completed in standing at sink with pt demonstrating improvements with vision with obtaining items without overshooting this session, only one instance of undershooting when returning toothbrush to cup.  Ambulated with RW to ADL apt with supervision.  Completed furniture transfers and tub/shower transfer with supervision.  Pt has garden tub and shower chair, requiring stepping over tub ledge.  Ambulated approx 50 feet without RW with min/steady assist and occasional drifting to Rt.  Demonstrated mild LOB with turn and when people walking by ?due to visual impairments.  2) Treatment session with focus on BUE [facility] and vision during table top task.  Pt ambulated to therapy gym with RW and close supervision, noted pt to veer Rt with ambulation.  Completed 9 hole peg test with Rt: 2:13 and Lt: 2:45 and pt dropping items with each hand.  Pt demonstrating difficulty picking up small pegs, requiring increased time.  Further assessment of vision with pt able to track therapist with Lt eye laterally but unable to track past midline medially, with tracking pen pt with decreased tracking to lateral aspect.   Pt reports vision improved today compared to yesterday.  Provided education on visual deficits and their impairments on safety and with functional mobility.  Pt returned to room approx 50 feet without RW with min/steady assist due to veering Rt.  Therapy Documentation Precautions:  Precautions Precautions: Fall Precaution Comments: vision deficits Restrictions Weight Bearing Restrictions: No General:   Vital Signs: Therapy Vitals Temp: 98 F (36.7 C) Temp Source: Oral Pulse Rate: 71 Resp: 20 BP: (!) 167/69 mmHg Patient Position (if appropriate): Lying Oxygen Therapy SpO2: 95 % O2 Device: Not Delivered Pain:  Pt with no c/o pain  See Function Navigator for Current Functional Status.   Therapy/Group: Individual Therapy  [name] 07/15/2015, 9:09 AM

## 2015-07-15 NOTE — Progress Notes (Signed)
S:   No new CO O:BP 167/69 mmHg  Pulse 71  Temp(Src) 98 F (36.7 C) (Oral)  Resp 20  Ht 5.9" (0.15 m)  Wt 98.204 kg (216 lb 8 oz)  BMI 4364.62 kg/m2  SpO2 95%  Intake/Output Summary (Last 24 hours) at 07/15/15 0849 Last data filed at 07/14/15 1700  Gross per 24 hour  Intake    360 ml  Output      0 ml  Net    360 ml   Weight change:  Gen: awake and alert CVS:RRR Resp:Clear Abd:+ BS NTND Ext:no edema  LUA AVG + bruit NEURO:CNI Ox3 no asterixis   . allopurinol  100 mg Oral Daily  . amitriptyline  25 mg Oral QHS  . antiseptic oral rinse  7 mL Mouth Rinse q12n4p  . aspirin EC  81 mg Oral Daily  . chlorhexidine  15 mL Mouth Rinse BID  . cinacalcet  60 mg Oral BID WC  . clopidogrel  75 mg Oral Daily  . colchicine  0.6 mg Oral Daily  . feeding supplement (NEPRO CARB STEADY)  237 mL Oral BID BM  . heparin  5,000 Units Subcutaneous 3 times per day  . insulin aspart  0-9 Units Subcutaneous TID WC  . insulin NPH Human  8 Units Subcutaneous BID AC & HS  . levETIRAcetam  1,000 mg Oral Daily  . levETIRAcetam  500 mg Oral Q M,W,F-2000  . multivitamin  1 tablet Oral QHS  . simvastatin  20 mg Oral q1800   Ct Head Wo Contrast  07/13/2015   CLINICAL DATA:  Probable stroke. The patient is unable to move his right non. Double vision beginning 2 a.m. this morning. Difficulty walking, new.  EXAM: CT HEAD WITHOUT CONTRAST  TECHNIQUE: Contiguous axial images were obtained from the base of the skull through the vertex without intravenous contrast.  COMPARISON:  Head CT scan 07/11/2015 and 04/25/2015.  FINDINGS: Cortical atrophy and chronic microvascular ischemic change are again seen. Small, remote lacunar infarction right cerebellum also again seen. No evidence of acute intracranial abnormality including hemorrhage, infarct, mass lesion, mass effect, midline shift or abnormal extra-axial fluid collection is identified. The calvarium is intact. No hydrocephalus or pneumocephalus. Small amount of  fluid in the mastoid air cells bilaterally is identified. Dense atherosclerotic vascular disease is seen.  IMPRESSION: No acute abnormality.  Atrophy and chronic microvascular ischemic change.  Small amount of fluid in mastoid air cells bilaterally.   Electronically Signed   By: Inge Rise M.D.   On: 07/13/2015 15:12   BMET    Component Value Date/Time   NA 134* 07/13/2015 1735   K 4.1 07/13/2015 1735   CL 94* 07/13/2015 1735   CO2 33* 07/13/2015 1735   GLUCOSE 181* 07/13/2015 1735   BUN 16 07/13/2015 1735   CREATININE 5.88* 07/13/2015 1735   CALCIUM 8.6* 07/13/2015 1735   GFRNONAA 9* 07/13/2015 1735   GFRAA 10* 07/13/2015 1735   CBC    Component Value Date/Time   WBC 7.2 07/13/2015 1735   RBC 4.22 07/13/2015 1735   HGB 12.0* 07/13/2015 1735   HCT 36.2* 07/13/2015 1735   PLT 162 07/13/2015 1735   MCV 85.8 07/13/2015 1735   MCH 28.4 07/13/2015 1735   MCHC 33.1 07/13/2015 1735   RDW 14.9 07/13/2015 1735   LYMPHSABS 1.5 07/11/2015 1357   MONOABS 0.7 07/11/2015 1357   EOSABS 0.2 07/11/2015 1357   BASOSABS 0.1 07/11/2015 1357     Assessment: 1.  Pontine stroke 2. ESRD MWF 3. Anemia 4. Sec HPTH on calcitriol.  Binders on hold for low PO4 5. DM 6. HTN Plan: 1. Plan HD today and will recheck PO4 and resume binders as appropriate 2.  Will resume lisinopril   MATTINGLY,MICHAEL T

## 2015-07-15 NOTE — Progress Notes (Signed)
Speech Language Pathology Daily Session Note  Patient Details  Name: Ernest [name] MRN: [id number] Date of Birth: 1945-12-07  Today's Date: 07/15/2015 SLP Individual Time: 1535-1600 SLP Individual Time Calculation (min): 25 min  Short Term Goals: Week 1: SLP Short Term Goal 1 (Week 1): STG=LTG due to ELOS   Skilled Therapeutic Interventions: Skilled treatment session focused on addressing speech intelligibility goals. SLP facilitated session by providing education regarding effective strategies that increase speech intelligibility.  Patient required repetition 7-8 times during session for storage and retrieval of slow and loud; due to being unable to read at baseline external aids were not effective.  Patient then required Mod verbal and non-verbal cues to utilize strategies in a structured conversation.  Continue with current plan of care and assess carryover of slow and loud next visit.   Function:  Cognition Comprehension Comprehension assist level: Understands complex 90% of the time/cues 10% of the time  Expression   Expression assist level: Expresses basic 50 - 74% of the time/requires cueing 25 - 49% of the time. Needs to repeat parts of sentences.  Social Interaction Social Interaction assist level: Interacts appropriately with others with medication or extra time (anti-anxiety, antidepressant).  Problem Solving Problem solving assist level: Solves basic 90% of the time/requires cueing < 10% of the time  Memory Memory assist level: Recognizes or recalls 75 - 89% of the time/requires cueing 10 - 24% of the time    Pain Pain Assessment Pain Assessment: No/denies pain Pain Score: 0-No pain  Therapy/Group: Individual Therapy  Carmelia Roller., [place] [id number]  [place] 07/15/2015, 4:06 PM

## 2015-07-15 NOTE — IPOC Note (Signed)
Overall Plan of Care [facility]) Patient Details Name: Ernest [name] MRN: [id number] DOB: [birth year]  Admitting Diagnosis: PONTINE CVA  Hospital Problems: Principal Problem:   Right pontine cerebrovascular accident Active Problems:   ESRD (end stage renal disease)   COPD (chronic obstructive pulmonary disease)     Functional Problem List: Nursing Endurance, Motor, Safety  PT Balance, Perception, Safety, Endurance, Sensory, Motor  OT Balance, Endurance, Vision, Safety  SLP Linguistic, Cognition  TR         Basic ADL's: OT Grooming, Bathing, Dressing, Toileting     Advanced  ADL's: OT       Transfers: PT Bed Mobility, Bed to Chair, Furniture, Teacher, early years/pre, Metallurgist: PT Ambulation, Data processing manager, Emergency planning/management officer     Additional Impairments: OT None  SLP Communication, Social Cognition expression Memory  TR      Anticipated Outcomes Item Anticipated Outcome  Self Feeding no goal  Swallowing      Basic self-care  Mod I  Toileting  Mod I   Bathroom Transfers Mod I  Bowel/Bladder  Continent to bowel.Oliguric,on Hemo(M-W-F).  Transfers  mod I  Locomotion  mod I LRAD in supervised environment  Communication  Mod I   Cognition  Mod I   Pain  Less than 3,on scale 1 to 10  Safety/Judgment  Free from fall during his stay in rehab   Therapy Plan: PT Intensity: Minimum of 1-2 x/day ,45 to 90 minutes PT Frequency: 5 out of 7 days PT Duration Estimated Length of Stay: 7-9 days OT Intensity: Minimum of 1-2 x/day, 45 to 90 minutes OT Frequency: 5 out of 7 days OT Duration/Estimated Length of Stay: 7-9 days SLP Intensity: Minumum of 1-2 x/day, 30 to 90 minutes SLP Frequency: 1 to 3 out of 7 days SLP Duration/Estimated Length of Stay: 5-7 days        Team Interventions: Nursing Interventions Patient/Family Education  PT interventions Ambulation/gait training, Training and development officer, Neuromuscular re-education, Functional mobility training,  DME/adaptive equipment instruction, Discharge planning, Patient/family education, Stair training, Therapeutic Activities, Therapeutic Exercise, UE/LE Coordination activities, Wheelchair propulsion/positioning, UE/LE Strength taining/ROM, Visual/perceptual remediation/compensation, Disease management/prevention  OT Interventions Training and development officer, Cognitive remediation/compensation, Community reintegration, Discharge planning, Disease mangement/prevention, DME/adaptive equipment instruction, Functional mobility training, Neuromuscular re-education, Pain management, Patient/family education, Psychosocial support, Self Care/advanced ADL retraining, Therapeutic Activities, Therapeutic Exercise, UE/LE Strength taining/ROM, UE/LE Coordination activities, Visual/perceptual remediation/compensation  SLP Interventions Cognitive remediation/compensation, English as a second language teacher, Patient/family education, Functional tasks, Environmental controls, Internal/external aids  TR Interventions    SW/CM Interventions Discharge Planning, Psychosocial Support, Patient/Family Education    Team Discharge Planning: Destination: PT-Home ,OT- Home , SLP-Home Projected Follow-up: PT-Home health PT, OT-  Outpatient OT, SLP-None Projected Equipment Needs: PT-To be determined, OT- None recommended by OT, SLP-None recommended by SLP Equipment Details: PT-pt has RW and SPC, likely will not need other equipment, OT-  Patient/family involved in discharge planning: PT- Patient,  OT-Patient, SLP-   MD ELOS: pontine infarct Medical Rehab Prognosis:  Excellent Assessment: The patient has been admitted for CIR therapies with the diagnosis of pontine infarct. The team will be addressing functional mobility, strength, stamina, balance, safety, adaptive techniques and equipment, self-care, bowel and bladder mgt, patient and caregiver education, NMR, visual spatial awareness, cognition, communication, speech, stroke education. Goals have  been set at mod I for mobility, self-care, ADL's, and communication and cognition.    [name], MD, [facility]      See Team Conference Notes for weekly updates to the  plan of care

## 2015-07-16 ENCOUNTER — Inpatient Hospital Stay (HOSPITAL_COMMUNITY): Payer: Medicare Other | Admitting: Occupational Therapy

## 2015-07-16 DIAGNOSIS — J438 Other emphysema: Secondary | ICD-10-CM

## 2015-07-16 DIAGNOSIS — I6350 Cerebral infarction due to unspecified occlusion or stenosis of unspecified cerebral artery: Secondary | ICD-10-CM

## 2015-07-16 LAB — GLUCOSE, CAPILLARY
GLUCOSE-CAPILLARY: 105 mg/dL — AB (ref 65–99)
GLUCOSE-CAPILLARY: 89 mg/dL (ref 65–99)
GLUCOSE-CAPILLARY: 162 mg/dL — AB (ref 65–99)
Glucose-Capillary: 106 mg/dL — ABNORMAL HIGH (ref 65–99)
Glucose-Capillary: 136 mg/dL — ABNORMAL HIGH (ref 65–99)
Glucose-Capillary: 186 mg/dL — ABNORMAL HIGH (ref 65–99)
Glucose-Capillary: 198 mg/dL — ABNORMAL HIGH (ref 65–99)
Glucose-Capillary: 72 mg/dL (ref 65–99)

## 2015-07-16 LAB — RENAL FUNCTION PANEL
ALBUMIN: 3.1 g/dL — AB (ref 3.5–5.0)
Anion gap: 13 (ref 5–15)
BUN: 46 mg/dL — AB (ref 6–20)
CALCIUM: 8.5 mg/dL — AB (ref 8.9–10.3)
CO2: 28 mmol/L (ref 22–32)
CREATININE: 10.84 mg/dL — AB (ref 0.61–1.24)
Chloride: 91 mmol/L — ABNORMAL LOW (ref 101–111)
GFR calc Af Amer: 5 mL/min — ABNORMAL LOW (ref >60)
GFR, EST NON AFRICAN AMERICAN: 4 mL/min — AB (ref >60)
Glucose, Bld: 156 mg/dL — ABNORMAL HIGH (ref 65–99)
PHOSPHORUS: 3.5 mg/dL (ref 2.5–4.6)
Potassium: 4.4 mmol/L (ref 3.5–5.1)
Sodium: 132 mmol/L — ABNORMAL LOW (ref 135–145)

## 2015-07-16 NOTE — Progress Notes (Signed)
Occupational Therapy Session Note  Patient Details  Name: Ernest [name] MRN: [id number] Date of Birth: Dec 28, 1945  Today's Date: 07/16/2015 OT Individual Time: 1500-1545 OT Individual Time Calculation (min): 45 min    Short Term Goals: Week 1:  OT Short Term Goal 1 (Week 1): STG = LTGs due to ELOS :     Skilled Therapeutic Interventions/Progress Updates:    Skilled OT intervention with treatment focus on the following:  Functional mobility, visual motor exercises.  Pt ambulated with RW to gym with min assist to SBA.  Engaged in visual exercises for movements in all directions but focus on left medial movement.  Practiced with scanning cards.  Left eye showing some muscle twitching to move past center and medially as exercises continues.  Provided pt some drawing number to number homework to practice in his room.  Ambulated back to room and left will all needs in reach.   Pt. Ambulated with   Therapy Documentation Precautions:  Precautions Precautions: Fall Precaution Comments: vision deficits Restrictions Weight Bearing Restrictions: No     Pain:  none             See Function Navigator for Current Functional Status.   Therapy/Group: Individual Therapy  Lisa Roca 07/16/2015, 5:07 PM

## 2015-07-16 NOTE — Progress Notes (Signed)
S:   No new CO O:BP 135/61 mmHg  Pulse 83  Temp(Src) 98.2 F (36.8 C) (Oral)  Resp 18  Ht 5.9" (0.15 m)  Wt 97 kg (213 lb 13.5 oz)  BMI 4311.11 kg/m2  SpO2 100%  Intake/Output Summary (Last 24 hours) at 07/16/15 0753 Last data filed at 07/16/15 0121  Gross per 24 hour  Intake    720 ml  Output   3002 ml  Net  -2282 ml   Weight change:  Gen: awake and alert CVS:RRR Resp:Clear Abd:+ BS NTND Ext:no edema  LUA AVG + bruit NEURO:CNI Ox3 no asterixis   . allopurinol  100 mg Oral Daily  . amitriptyline  25 mg Oral QHS  . antiseptic oral rinse  7 mL Mouth Rinse q12n4p  . aspirin EC  81 mg Oral Daily  . chlorhexidine  15 mL Mouth Rinse BID  . cinacalcet  60 mg Oral BID WC  . clopidogrel  75 mg Oral Daily  . colchicine  0.6 mg Oral Daily  . feeding supplement (NEPRO CARB STEADY)  237 mL Oral BID BM  . heparin  5,000 Units Subcutaneous 3 times per day  . insulin aspart  0-9 Units Subcutaneous TID WC  . insulin NPH Human  8 Units Subcutaneous BID AC & HS  . levETIRAcetam  1,000 mg Oral Daily  . levETIRAcetam  500 mg Oral Q M,W,F-2000  . lisinopril  20 mg Oral QHS  . multivitamin  1 tablet Oral QHS  . simvastatin  20 mg Oral q1800   No results found. BMET    Component Value Date/Time   NA 132* 07/16/2015 0027   K 4.4 07/16/2015 0027   CL 91* 07/16/2015 0027   CO2 28 07/16/2015 0027   GLUCOSE 156* 07/16/2015 0027   BUN 46* 07/16/2015 0027   CREATININE 10.84* 07/16/2015 0027   CALCIUM 8.5* 07/16/2015 0027   GFRNONAA 4* 07/16/2015 0027   GFRAA 5* 07/16/2015 0027   CBC    Component Value Date/Time   WBC 7.2 07/13/2015 1735   RBC 4.22 07/13/2015 1735   HGB 12.0* 07/13/2015 1735   HCT 36.2* 07/13/2015 1735   PLT 162 07/13/2015 1735   MCV 85.8 07/13/2015 1735   MCH 28.4 07/13/2015 1735   MCHC 33.1 07/13/2015 1735   RDW 14.9 07/13/2015 1735   LYMPHSABS 1.5 07/11/2015 1357   MONOABS 0.7 07/11/2015 1357   EOSABS 0.2 07/11/2015 1357   BASOSABS 0.1 07/11/2015 1357      Assessment: 1.  Pontine stroke 2. ESRD MWF 3. Anemia 4. Sec HPTH on calcitriol.  Binders on hold for low PO4 5. DM 6. HTN Plan: 1. Resume renvela for now 2. HD tomorrow  Yovana Scogin T

## 2015-07-16 NOTE — Progress Notes (Signed)
[place] PHYSICAL MEDICINE & REHABILITATION     PROGRESS NOTE    Subjective/Complaints: Pt seen and examined this AM sitting up in bed, watching TV.  Pt states he slept well overnight.    ROS: Pt denies fever, rash/itching, headache,  nausea, vomiting, abdominal pain, diarrhea, chest pain, shortness of breath, palpitations, dysuria, dizziness, neck or back pain, bleeding, anxiety, or depression   Objective: Vital Signs: Blood pressure 135/61, pulse 83, temperature 98.2 F (36.8 C), temperature source Oral, resp. rate 18, height 5.9" (0.15 m), weight 97 kg (213 lb 13.5 oz), SpO2 100 %. No results found.  Recent Labs  07/13/15 1735  WBC 7.2  HGB 12.0*  HCT 36.2*  PLT 162    Recent Labs  07/13/15 1735 07/16/15 0027  NA 134* 132*  K 4.1 4.4  CL 94* 91*  GLUCOSE 181* 156*  BUN 16 46*  CREATININE 5.88* 10.84*  CALCIUM 8.6* 8.5*   CBG (last 3)   Recent Labs  07/16/15 0743 07/16/15 1117 07/16/15 1130  GLUCAP 136* 72 89    Wt Readings from Last 3 Encounters:  07/16/15 97 kg (213 lb 13.5 oz)  07/13/15 95.8 kg (211 lb 3.2 oz)  07/06/15 102.059 kg (225 lb)    Physical Exam:  Constitutional: He appears well-developed and well-nourished.  HENT: oral mucosa pink and moist.  Head: Normocephalic and atraumatic.  Eyes: No scleral icterus.  EOMI Neck: Normal range of motion. Neck supple. No JVD present. No tracheal deviation present. No thyromegaly present.  Cardiovascular: Normal rate and regular rhythm. no murmurs or rubs Respiratory: Effort normal and breath sounds normal. No respiratory distress. No wheezes or rales GI: Soft. Bowel sounds are normal. He exhibits no distension.  Neurological: He is alert.  Has difficulty tracking to the left. Left CN6 palsy. ?also with left CN III palsy (inconsisten?)--can track vertically. easily in other directions. B/l dysmetria.  Speech is a bit dysarthric but intelligible. Fair awareness of deficits. Follows commands.  MMT: 4+ to 5/5 bilateral UE delt,bicep,tricep, wrist, fingers LE: 4/5 HF, KE and ADF/APF bilaterally. Decreased LT in both feet. Fair insight and awareness.   Skin: Skin is warm and dry.  Psychiatric: He has a normal mood and affect. His behavior is normal   Assessment/Plan: 1. Functional deficits secondary to right pontine infarct which require 3+ hours per day of interdisciplinary therapy in a comprehensive inpatient rehab setting. Physiatrist is providing close team supervision and 24 hour management of active medical problems listed below. Physiatrist and rehab team continue to assess barriers to discharge/monitor patient progress toward functional and medical goals.  Function:  Bathing Bathing position   Position: Shower  Bathing parts Body parts bathed by patient: Right arm, Left arm, Chest, Abdomen, Front perineal area, Buttocks, Right upper leg, Left upper leg, Right lower leg, Left lower leg, Back    Bathing assist Assist Level: Supervision or verbal cues      Upper Body Dressing/Undressing Upper body dressing   What is the patient wearing?: Pull over shirt/dress     Pull over shirt/dress - Perfomed by patient: Thread/unthread right sleeve, Thread/unthread left sleeve, Put head through opening, Pull shirt over trunk          Upper body assist Assist Level: Set up   Set up : To obtain clothing/put away  Lower Body Dressing/Undressing Lower body dressing   What is the patient wearing?: Underwear, Pants, Socks, Shoes Underwear - Performed by patient: Thread/unthread right underwear leg, Thread/unthread left underwear leg, Pull  underwear up/down   Pants- Performed by patient: Thread/unthread right pants leg, Thread/unthread left pants leg, Pull pants up/down       Socks - Performed by patient: Don/doff right sock, Don/doff left sock   Shoes - Performed by patient: Don/doff right shoe, Don/doff left shoe            Lower body assist Assist Level: Supervision or  verbal cues      Toileting Toileting   Toileting steps completed by patient: Adjust clothing prior to toileting, Performs perineal hygiene, Adjust clothing after toileting   Toileting Assistive Devices: Grab bar or rail  Toileting assist Assist level: Supervision or verbal cues   Transfers Chair/bed transfer   Chair/bed transfer method: Stand pivot, Ambulatory Chair/bed transfer assist level: Touching or steadying assistance (Pt > 75%) Chair/bed transfer assistive device: Armrests     Locomotion Ambulation     Max distance: 175 Assist level: Supervision or verbal cues   Wheelchair   Type: Manual Max wheelchair distance: 150 Assist Level: Supervision or verbal cues  Cognition Comprehension Comprehension assist level: Follows basic conversation/direction with no assist  Expression Expression assist level: Expresses basic needs/ideas: With no assist  Social Interaction Social Interaction assist level: Interacts appropriately with others with medication or extra time (anti-anxiety, antidepressant).  Problem Solving Problem solving assist level: Solves basic 75 - 89% of the time/requires cueing 10 - 24% of the time  Memory Memory assist level: Recognizes or recalls 75 - 89% of the time/requires cueing 10 - 24% of the time   Medical Problem List and Plan: 1. Functional deficits secondary to pontine infarct with diplopia, speech, and balance deficits  -Cont CIR  -follow up with neuro regarding potential location of infarct, ?INO 2. DVT Prophylaxis/Anticoagulation: Subcutaneous heparin. Monitor platelet counts and any signs of bleeding 3. Pain Management: Tylenol as needed 4. End-stage renal disease. Continue hemodialysis as per renal services. HD will be after therapies to avoid conflict with therapy day 5. Neuropsych: This patient is capable of making decisions on his own behalf. 6. Skin/Wound Care: Routine skin checks. Encourage PO  7. Fluids/Electrolytes/Nutrition: Routine  I&O's recent labs personally reviewed and stable. Potassium now nl. 8. Seizure disorder. Keppra 1000 mg daily. Takes extra 500 mg tablet Monday Wednesday Friday after dialysis.   -no current seizure activity 9. Diabetes mellitus of peripheral neuropathy. Hemoglobin A1c 7.7. Insulin NPH 8 units twice a day.   -follow for pattern  -Improved, will cont to monitor 10. History of gout. Continue Zyloprim daily, colchicine 0.6 mg daily. Follow for gout flareups 11. Hyperlipidemia. Zocor 12. HTN  Improved after restarted lisinopril and HD  Will cont to monitor  LOS (Days) 3 A FACE TO FACE EVALUATION WAS PERFORMED  Felicite Zeimet Lorie Phenix 07/16/2015 12:07 PM

## 2015-07-17 ENCOUNTER — Inpatient Hospital Stay (HOSPITAL_COMMUNITY): Payer: Medicare Other

## 2015-07-17 ENCOUNTER — Inpatient Hospital Stay (HOSPITAL_COMMUNITY): Payer: Medicare Other | Admitting: Occupational Therapy

## 2015-07-17 DIAGNOSIS — I6350 Cerebral infarction due to unspecified occlusion or stenosis of unspecified cerebral artery: Secondary | ICD-10-CM

## 2015-07-17 DIAGNOSIS — J438 Other emphysema: Secondary | ICD-10-CM

## 2015-07-17 LAB — GLUCOSE, CAPILLARY
GLUCOSE-CAPILLARY: 114 mg/dL — AB (ref 65–99)
GLUCOSE-CAPILLARY: 129 mg/dL — AB (ref 65–99)
GLUCOSE-CAPILLARY: 129 mg/dL — AB (ref 65–99)
Glucose-Capillary: 62 mg/dL — ABNORMAL LOW (ref 65–99)
Glucose-Capillary: 67 mg/dL (ref 65–99)
Glucose-Capillary: 139 mg/dL — ABNORMAL HIGH (ref 65–99)

## 2015-07-17 MED ORDER — SEVELAMER CARBONATE 800 MG PO TABS
800.0000 mg | ORAL_TABLET | Freq: Three times a day (TID) | ORAL | Status: DC
  Administered 2015-07-17 – 2015-07-21 (×12): 800 mg via ORAL
  Filled 2015-07-17 (×12): qty 1

## 2015-07-17 MED ORDER — INSULIN NPH (HUMAN) (ISOPHANE) 100 UNIT/ML ~~LOC~~ SUSP
7.0000 [IU] | Freq: Two times a day (BID) | SUBCUTANEOUS | Status: DC
  Administered 2015-07-17 – 2015-07-21 (×8): 7 [IU] via SUBCUTANEOUS
  Filled 2015-07-17 (×2): qty 10

## 2015-07-17 NOTE — Progress Notes (Signed)
Occupational Therapy Session Note  Patient Details  Name: Ernest [name] MRN: [id number] Date of Birth: 05/23/1946  Today's Date: 07/17/2015 OT Individual Time:  -   0800-0900  (60 min)  1st session                                            1400-1530   (90 min )    2nd session      Short Term Goals: Week 1:  OT Short Term Goal 1 (Week 1): STG = LTGs due to ELOS      Skilled Therapeutic Interventions/Progress Updates:    1st session:  Pain: none:     Skilled OT intervention with treatment focus on the following: Functional mobility, transfers with ADL:, visual motor exercises. Pt ambulated with RW to bathroom with min assist.   Transferred to toilet with min cues for keeping walker with him.  Pt doffed pants, shirt while sitting on toilet.  Pt showered with sit to stand with SBA.  He dressed with set up and did sit to stand with no LOB.  Pt. Left in bed with all needs in reach.      2nd session:  Pain: none:  Ambulated to [place] with RW.  Engaged in dynavision activity with focus on right lateral gaze movements.  Pt. Reaction time was 3.29 sec in upper right and 1.12 in lower right.  Ambulated to kitchen and prepared one item (soup).  Pt need min assist  To find items (pt unable to read).  He turned on stove with OT pointing out red design on stove.  Needed dues to slde dishes down to sink instead of carrying with walker.   Therapy Documentation Precautions:  Precautions Precautions: Fall Precaution Comments: vision deficits Restrictions Weight Bearing Restrictions: No    Vital Signs: Therapy Vitals Temp: 98.3 F (36.8 C) Temp Source: Oral Pulse Rate: 68 Resp: 18 BP: (!) 170/69 mmHg Patient Position (if appropriate): Lying Oxygen Therapy SpO2: 96 % O2 Device: Not Delivered           See Function Navigator for Current Functional Status.   Therapy/Group: Individual Therapy  Lisa Roca 07/17/2015, 7:38 AM

## 2015-07-17 NOTE — Progress Notes (Addendum)
[facility] PHYSICAL MEDICINE & REHABILITATION     PROGRESS NOTE    Subjective/Complaints: Pt seen and examined this AM sitting up in bed, watching TV.  Pt slept well overnight.  He is in good spirits this AM and looking forward to his lite day in therapies.     ROS: Pt denies fever, rash/itching, headache,  nausea, vomiting, abdominal pain, diarrhea, chest pain, shortness of breath, palpitations, dysuria, dizziness, neck or back pain, bleeding, anxiety, or depression  Objective: Vital Signs: Blood pressure 149/63, pulse 71, temperature 98 F (36.7 C), temperature source Oral, resp. rate 18, height 5.9" (0.15 m), weight 97 kg (213 lb 13.5 oz), SpO2 100 %. No results found. No results for input(s): WBC, HGB, HCT, PLT in the last 72 hours.  Recent Labs  07/16/15 0027  NA 132*  K 4.4  CL 91*  GLUCOSE 156*  BUN 46*  CREATININE 10.84*  CALCIUM 8.5*   CBG (last 3)   Recent Labs  07/17/15 0558 07/17/15 1209 07/17/15 1634  GLUCAP 114* 129* 129*    Wt Readings from Last 3 Encounters:  07/16/15 97 kg (213 lb 13.5 oz)  07/13/15 95.8 kg (211 lb 3.2 oz)  07/06/15 102.059 kg (225 lb)    Physical Exam:  Constitutional: He appears well-developed and well-nourished.  HENT: oral mucosa pink and moist.  Head: Normocephalic and atraumatic.  Eyes: No scleral icterus.  EOMI Neck: Normal range of motion. Neck supple. No JVD present. No tracheal deviation present. No thyromegaly present.  Cardiovascular: Normal rate and regular rhythm. no murmurs or rubs Respiratory: Effort normal and breath sounds normal. No respiratory distress. No wheezes or rales GI: Soft. Bowel sounds are normal. He exhibits no distension.  Neurological: He is alert.  Has difficulty tracking to the left. Left CN6 palsy. ?also with left CN III palsy (inconsisten?)--can track vertically. easily in other directions. B/l dysmetria.  Speech is a bit dysarthric but intelligible. Awareness of deficits. Follows  commands. MMT: 4+ to 5/5 bilateral UE delt,bicep,tricep, wrist, fingers LE: 4/5 HF, KE and ADF/APF bilaterally.   Skin: Skin is warm and dry.  Psychiatric: He has a normal mood and affect. His behavior is normal   Assessment/Plan: 1. Functional deficits secondary to right pontine infarct which require 3+ hours per day of interdisciplinary therapy in a comprehensive inpatient rehab setting. Physiatrist is providing close team supervision and 24 hour management of active medical problems listed below. Physiatrist and rehab team continue to assess barriers to discharge/monitor patient progress toward functional and medical goals.  Function:  Bathing Bathing position   Position: Shower  Bathing parts Body parts bathed by patient: Right arm, Left arm, Chest, Abdomen, Front perineal area, Buttocks, Right upper leg, Left upper leg, Right lower leg, Left lower leg Body parts bathed by helper: Back  Bathing assist Assist Level: Supervision or verbal cues      Upper Body Dressing/Undressing Upper body dressing   What is the patient wearing?: Pull over shirt/dress     Pull over shirt/dress - Perfomed by patient: Thread/unthread right sleeve, Thread/unthread left sleeve, Put head through opening, Pull shirt over trunk          Upper body assist Assist Level: Set up   Set up : To obtain clothing/put away  Lower Body Dressing/Undressing Lower body dressing   What is the patient wearing?: Underwear, Pants, Socks, Shoes Underwear - Performed by patient: Thread/unthread right underwear leg, Thread/unthread left underwear leg, Pull underwear up/down   Pants- Performed by patient:  Thread/unthread right pants leg, Thread/unthread left pants leg, Pull pants up/down       Socks - Performed by patient: Don/doff right sock, Don/doff left sock   Shoes - Performed by patient: Don/doff right shoe, Don/doff left shoe            Lower body assist Assist Level: Supervision or verbal cues       Toileting Toileting   Toileting steps completed by patient: Adjust clothing prior to toileting, Performs perineal hygiene, Adjust clothing after toileting   Toileting Assistive Devices: Grab bar or rail  Toileting assist Assist level: Supervision or verbal cues   Transfers Chair/bed transfer   Chair/bed transfer method: Ambulatory Chair/bed transfer assist level: Supervision or verbal cues Chair/bed transfer assistive device: Medical sales representative     Max distance: 150 Assist level: Touching or steadying assistance (Pt > 75%)   Wheelchair   Type: Manual Max wheelchair distance: 150 Assist Level: Supervision or verbal cues  Cognition Comprehension Comprehension assist level: Follows basic conversation/direction with extra time/assistive device  Expression Expression assist level: Expresses basic needs/ideas: With extra time/assistive device  Social Interaction Social Interaction assist level: Interacts appropriately with others with medication or extra time (anti-anxiety, antidepressant).  Problem Solving Problem solving assist level: Solves basic 75 - 89% of the time/requires cueing 10 - 24% of the time  Memory Memory assist level: Recognizes or recalls 75 - 89% of the time/requires cueing 10 - 24% of the time   Medical Problem List and Plan: 1. Functional deficits secondary to pontine infarct with diplopia, speech, and balance deficits  -Cont CIR  -follow up with neuro regarding potential location of infarct, ?INO 2. DVT Prophylaxis/Anticoagulation: Subcutaneous heparin. Monitor platelet counts and any signs of bleeding 3. Pain Management: Tylenol as needed 4. End-stage renal disease. Continue hemodialysis as per renal services. HD will be after therapies to avoid conflict with therapy day 5. Neuropsych: This patient is capable of making decisions on his own behalf. 6. Skin/Wound Care: Routine skin checks. Encourage PO  7. Fluids/Electrolytes/Nutrition: Routine  I&O's recent labs personally reviewed and stable. Potassium now nl. 8. Seizure disorder. Keppra 1000 mg daily. Takes extra 500 mg tablet Monday Wednesday Friday after dialysis.   -no current seizure activity 9. Diabetes mellitus of peripheral neuropathy. Hemoglobin A1c 7.7.   -follow for pattern  -Improved, will cont to monitor, fasting CBG in 60s this AM,  Insulin NPH 8 units twice a day.decreased to 7U on 10/2.  will cont to monitor  10. History of gout. Continue Zyloprim daily, colchicine 0.6 mg daily. Follow for gout flareups 11. Hyperlipidemia. Zocor 12. HTN  Improved after restarted lisinopril and HD  Will cont to monitor  LOS (Days) 4 A FACE TO FACE EVALUATION WAS PERFORMED  Kasey Hansell Lorie Phenix 07/17/2015 4:49 PM

## 2015-07-17 NOTE — Progress Notes (Signed)
Physical Therapy Session Note  Patient Details  Name: Ernest [name] MRN: [id number] Date of Birth: Feb 19, 1946  Today's Date: 07/17/2015 PT Individual Time: 1105-1205 PT Individual Time Calculation (min): 60 min   Short Term Goals: Week 1:  PT Short Term Goal 1 (Week 1): =LTG due to estimated length of stay  Skilled Therapeutic Interventions/Progress Updates:   Pt asleep and slow to wake up.  Gait with RW to gym, x 125' with min assist, mild LOB R x 2. Pt tends to step outside RW to R during turns to R. Gait to return to room slightly more fluid, without LOB. Pt demonstrates poor push-off bilaterally.  neuromuscular re-education via demo, VCS, manual cues for activities below.  Pt left sitting up in armchair in his room, with all needs left within reach.  Set up for lunch.    Therapy Documentation Precautions:  Precautions Precautions: Fall Precaution Comments: vision deficits Restrictions Weight Bearing Restrictions: No Pain: Pain Assessment Pain Assessment: No/denies pain   Other Treatments: Treatments Therapeutic Activity: seated unsupported- bil hip adduction with neutral hip alighment;  pelvic dissociiation , beach ball tap with weighted bar to facilitate back ext; reaching out of BOS with L/R UEs to faciiitate trunk shortening/lengthening/rotating; standing biased to R during dual task with clothes pins; standing on compliant wdge for bil heel cord stretch and balance challenge posteriorly Neuromuscular Facilitation: Right;Upper Extremity;Lower Extremity;Forced use;Activity to increase grading;Activity to increase sustained activation;Activity to increase timing and sequencing;Activity to increase lateral weight shifting;Activity to increase anterior-posterior weight shifting;Activity to increase coordination Weight Bearing Technique Weight Bearing Technique: Yes RUE Weight Bearing Technique: Forearm seated   See Function Navigator for Current Functional  Status.   Therapy/Group: Individual Therapy  Samyukta Cura 07/17/2015, 12:33 PM

## 2015-07-17 NOTE — Progress Notes (Signed)
S:   No new CO.  Doing well with rehab O:BP 170/69 mmHg  Pulse 68  Temp(Src) 98.3 F (36.8 C) (Oral)  Resp 18  Ht 5.9" (0.15 m)  Wt 97 kg (213 lb 13.5 oz)  BMI 4311.11 kg/m2  SpO2 96%  Intake/Output Summary (Last 24 hours) at 07/17/15 0838 Last data filed at 07/17/15 0816  Gross per 24 hour  Intake   1080 ml  Output      0 ml  Net   1080 ml   Weight change:  Gen: awake and alert CVS:RRR Resp:Clear Abd:+ BS NTND Ext:no edema  LUA AVG + bruit NEURO:CNI Ox3 no asterixis   . allopurinol  100 mg Oral Daily  . amitriptyline  25 mg Oral QHS  . antiseptic oral rinse  7 mL Mouth Rinse q12n4p  . aspirin EC  81 mg Oral Daily  . chlorhexidine  15 mL Mouth Rinse BID  . cinacalcet  60 mg Oral BID WC  . clopidogrel  75 mg Oral Daily  . colchicine  0.6 mg Oral Daily  . feeding supplement (NEPRO CARB STEADY)  237 mL Oral BID BM  . heparin  5,000 Units Subcutaneous 3 times per day  . insulin aspart  0-9 Units Subcutaneous TID WC  . insulin NPH Human  8 Units Subcutaneous BID AC & HS  . levETIRAcetam  1,000 mg Oral Daily  . levETIRAcetam  500 mg Oral Q M,W,F-2000  . lisinopril  20 mg Oral QHS  . multivitamin  1 tablet Oral QHS  . simvastatin  20 mg Oral q1800   No results found. BMET    Component Value Date/Time   NA 132* 07/16/2015 0027   K 4.4 07/16/2015 0027   CL 91* 07/16/2015 0027   CO2 28 07/16/2015 0027   GLUCOSE 156* 07/16/2015 0027   BUN 46* 07/16/2015 0027   CREATININE 10.84* 07/16/2015 0027   CALCIUM 8.5* 07/16/2015 0027   GFRNONAA 4* 07/16/2015 0027   GFRAA 5* 07/16/2015 0027   CBC    Component Value Date/Time   WBC 7.2 07/13/2015 1735   RBC 4.22 07/13/2015 1735   HGB 12.0* 07/13/2015 1735   HCT 36.2* 07/13/2015 1735   PLT 162 07/13/2015 1735   MCV 85.8 07/13/2015 1735   MCH 28.4 07/13/2015 1735   MCHC 33.1 07/13/2015 1735   RDW 14.9 07/13/2015 1735   LYMPHSABS 1.5 07/11/2015 1357   MONOABS 0.7 07/11/2015 1357   EOSABS 0.2 07/11/2015 1357   BASOSABS  0.1 07/11/2015 1357     Assessment: 1.  Pontine stroke 2. ESRD MWF 3. Anemia 4. Sec HPTH on calcitriol. 5. DM 6. HTN Plan: 1. Plan HD tomorrow 2. Resume renvela at lower dose   Benny Henrie T

## 2015-07-18 ENCOUNTER — Inpatient Hospital Stay (HOSPITAL_COMMUNITY): Payer: Medicare Other | Admitting: Occupational Therapy

## 2015-07-18 ENCOUNTER — Inpatient Hospital Stay (HOSPITAL_COMMUNITY): Payer: Medicare Other | Admitting: Speech Pathology

## 2015-07-18 ENCOUNTER — Inpatient Hospital Stay (HOSPITAL_COMMUNITY): Payer: Medicare Other | Admitting: Physical Therapy

## 2015-07-18 LAB — GLUCOSE, CAPILLARY
GLUCOSE-CAPILLARY: 116 mg/dL — AB (ref 65–99)
GLUCOSE-CAPILLARY: 135 mg/dL — AB (ref 65–99)
GLUCOSE-CAPILLARY: 149 mg/dL — AB (ref 65–99)
Glucose-Capillary: 149 mg/dL — ABNORMAL HIGH (ref 65–99)

## 2015-07-18 NOTE — Progress Notes (Signed)
Occupational Therapy Session Note  Patient Details  Name: Ernest [name] MRN: [id number] Date of Birth: April 04, 1946  Today's Date: 07/18/2015 OT Individual Time: [id number] and 1410-1440 OT Individual Time Calculation (min): 55 min and 30 min   Short Term Goals: Week 1:  OT Short Term Goal 1 (Week 1): STG = LTGs due to ELOS  Skilled Therapeutic Interventions/Progress Updates:    1) Treatment session with focus on functional mobility, trunk control, and compensatory strategies for vision.  Pt completed grooming tasks in standing with supervision.  Ambulated to therapy gym with RW and supervision, noted drifting to Rt during ambulation but on LOB this session.  Engaged in [place] with focus on weight shifting, noting increased difficulty with weight shifting in all directions to Lt.  Facilitate weight shift with manipulation of pelvis, with pt unable to carryover without physical assist.  Addressed trunk control further in sitting on mat without BLE support with focus on reaching outside BOS to Lt to facilitate weight shifting and visual tracking to locate items.  Ball toss without BLE support with focus on vision, trunk control, and reaction time, followed by horse shoe toss in standing with pt requiring min assist without UE support for standing balance and mod assist with bending to retrieve tossed items.  Pt required prolonged rest break post horse shoe toss and retrieval.   2) Treatment session with focus on standing balance, visual scanning, and reaction time.  Pt ambulated to therapy gym and engaged in [employer] activity with focus on above.  Pt reaction time in Mode A (assessment) progressed from 4.0, 3.53, and 3.16 seconds.  Completed Mode B with 5 second time limit with pt able to correctly locate 10/11 in 3.43 seconds and then 14.14 in 2.49 seconds.  Noted pt with increased time to locate items in Lt visual field as well as some undershooting when reaching for lights on Lt.  Pt returned to  room at end of session and left seated at EOB with wife present.  Therapy Documentation Precautions:  Precautions Precautions: Fall Precaution Comments: vision deficits Restrictions Weight Bearing Restrictions: No Pain: Pain Assessment Pain Assessment: No/denies pain Pain Score: 0-No pain  See Function Navigator for Current Functional Status.   Therapy/Group: Individual Therapy  [name] 07/18/2015, 12:10 PM

## 2015-07-18 NOTE — Progress Notes (Signed)
Physical Therapy Session Note  Patient Details  Name: Ernest [name] MRN: [id number] Date of Birth: 1946/05/13  Today's Date: 07/18/2015 PT Individual Time: 1000-1100 PT Individual Time Calculation (min): 60 min   Short Term Goals: Week 1:  PT Short Term Goal 1 (Week 1): =LTG due to estimated length of stay  Skilled Therapeutic Interventions/Progress Updates:    Pt received supine in bed, no c/o pain and agreeable to treatment. Bed mobility and sit >stand with RW and supervision. Gait x170' with RW and supervision; noted minor LOB/R lateral trunk lean when turning to L side, however able to correct without assist. Sitting balance activity performed with cards placed underneath B hips, requiring pt to weight shift onto opposite side with resulting lumbopelvic dissociation. Required repetitive verbal/tactile cues initially to weight shift in order to retrieve card, however improved with repetition. Standing balance with BLEs on foam wedge for prolonged gastroc/soleus stretch while performing overhead reaching task with BUE to place horseshoes on basketball hoop. Occasional LOB posteriorly which requires modA to recover. Stair training for 2 trials of 16 6-inch stairs, 2 handrails with supervision to improve LE strength and endurance for carryover into access to home. Dynamic balance activities with ambulation without AD; requires CGA overall and occasional modA for preventing LOB, modA for reaching objects from ground. Activity also required visual scanning, cognitive dual task. Improved safety and reduced assist needed with repetitive trials. Nustep x10 min with BUE/BLE to improve aerobic endurance for carryover into gait for functional distances. Gait with RW to return to room, 170' with supervision. Pt remained seated in bed with all needs in reach, alarm intact at completion of session.   Therapy Documentation Precautions:  Precautions Precautions: Fall Precaution Comments: vision  deficits Restrictions Weight Bearing Restrictions: No Pain: Pain Assessment Pain Assessment: No/denies pain Pain Score: 0-No pain   See Function Navigator for Current Functional Status.   Therapy/Group: Individual Therapy  Luberta Mutter 07/18/2015, 12:23 PM

## 2015-07-18 NOTE — Progress Notes (Signed)
Speech Language Pathology Daily Session Note  Patient Details  Name: RAYHAAN ANTONOPOULOS MRN: [id number] Date of Birth: [birth year]  Today's Date: 07/18/2015 SLP Individual Time: 1300-1355 SLP Individual Time Calculation (min): 55 min  Short Term Goals: Week 1: SLP Short Term Goal 1 (Week 1): STG=LTG due to ELOS   Skilled Therapeutic Interventions: Skilled treatment session focused on addressing speech-language goals and education with spouse.  Patient was able to recall slow and loud as effective strategy following session last week.  SLP facilitated session by providing a structured barrier task that require patient to use a given word in a sentence which he did with 90% intelligibility.  The barrier was removed and SLP gave conversation starter topic for a semi-structured which patient completed with ~85% intelligibility.  Patient demonstrated anomia x2 and require Min cues to utilize description in the moment.  Spouse present and SLP initiated education regarding speech, language and memory compensatory strategies. Continue with current plan of care.    Function:   Cognition Comprehension Comprehension assist level: Follows basic conversation/direction with extra time/assistive device  Expression   Expression assist level: Expresses complex 90% of the time/cues < 10% of the time;Expresses basic 90% of the time/requires cueing < 10% of the time.  Social Interaction Social Interaction assist level: Interacts appropriately 90% of the time - Needs monitoring or encouragement for participation or interaction.  Problem Solving Problem solving assist level: Solves basic 75 - 89% of the time/requires cueing 10 - 24% of the time  Memory Memory assist level: Recognizes or recalls 90% of the time/requires cueing < 10% of the time    Pain Pain Assessment Pain Assessment: No/denies pain  Therapy/Group: Individual Therapy  Carmelia Roller., CCC-SLP [id number]  [place] 07/18/2015, 4:35  PM

## 2015-07-18 NOTE — Progress Notes (Signed)
Social Work Patient ID: Ernest [name], male   DOB: [birth year], 69 y.o.   MRN: [id number] Met with pt and wife who was here to answer her questions.  She wants as much help in the home as possible due to both of them are sick. She wants a cleaner and cook, which this worker informed her was not covered by their health insurance. Discussed home health therapies and what they can provide. Wife doesn';t get around well herself and was having difficulty while here. Informed both he would probably be ready for discharge on Wed and will confirm tomorrow. Pt would have dialysis here before going home on Wed.  Will confirm with team and MD and contact wife.

## 2015-07-18 NOTE — Plan of Care (Signed)
Problem: RH PAIN MANAGEMENT Goal: RH STG PAIN MANAGED AT OR BELOW PT'S PAIN GOAL Less than 3,on 1 to 10 scale.  Outcome: Progressing No c/o pain

## 2015-07-18 NOTE — Progress Notes (Signed)
[facility] PHYSICAL MEDICINE & REHABILITATION     PROGRESS NOTE    Subjective/Complaints: Patient seen with OT. He is ambulating with a walker with supervision assistance. OT thinks that length of stay will be relatively short and may be raped discharge on 10 5 or 10 6    ROS: Pt denies fever, rash/itching, headache,  nausea, vomiting, abdominal pain, diarrhea, chest pain, shortness of breath, palpitations,   Objective: Vital Signs: Blood pressure 159/59, pulse 65, temperature 98 F (36.7 C), temperature source Oral, resp. rate 18, height 5.9" (0.15 m), weight 97 kg (213 lb 13.5 oz), SpO2 94 %. No results found. No results for input(s): WBC, HGB, HCT, PLT in the last 72 hours.  Recent Labs  07/16/15 0027  NA 132*  K 4.4  CL 91*  GLUCOSE 156*  BUN 46*  CREATININE 10.84*  CALCIUM 8.5*   CBG (last 3)   Recent Labs  07/18/15 0001 07/18/15 0433 07/18/15 1209  GLUCAP 149* 116* 149*    Wt Readings from Last 3 Encounters:  07/16/15 97 kg (213 lb 13.5 oz)  07/13/15 95.8 kg (211 lb 3.2 oz)  07/06/15 102.059 kg (225 lb)    Physical Exam:  Constitutional: He appears well-developed and well-nourished.  HENT: oral mucosa pink and moist.  Head: Normocephalic and atraumatic.  Eyes: No scleral icterus.  EOMI Neck: Normal range of motion. Neck supple. No JVD present. No tracheal deviation present. No thyromegaly present.  Cardiovascular: Normal rate and regular rhythm. no murmurs or rubs Respiratory: Effort normal and breath sounds normal. No respiratory distress. No wheezes or rales GI: Soft. Bowel sounds are normal. He exhibits no distension.  Neurological: He is alert.  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  Speech is a bit dysarthric but intelligible. Awareness of deficits. Follows commands. MMT: 4+ to 5/5 bilateral UE delt,bicep,tricep, wrist, fingers LE: 4/5 HF, KE and ADF/APF bilaterally.   Skin: Skin is warm and dry.  Psychiatric: He has a  normal mood and affect. His behavior is normal   Assessment/Plan: 1. Functional deficits secondary to right pontine infarct which require 3+ hours per day of interdisciplinary therapy in a comprehensive inpatient rehab setting. Physiatrist is providing close team supervision and 24 hour management of active medical problems listed below. Physiatrist and rehab team continue to assess barriers to discharge/monitor patient progress toward functional and medical goals.  Function:  Bathing Bathing position   Position: Shower  Bathing parts Body parts bathed by patient: Right arm, Left arm, Chest, Abdomen, Front perineal area, Buttocks, Right upper leg, Left upper leg, Right lower leg, Left lower leg Body parts bathed by helper: Back  Bathing assist Assist Level: Supervision or verbal cues      Upper Body Dressing/Undressing Upper body dressing   What is the patient wearing?: Pull over shirt/dress     Pull over shirt/dress - Perfomed by patient: Thread/unthread right sleeve, Thread/unthread left sleeve, Put head through opening, Pull shirt over trunk          Upper body assist Assist Level: Set up   Set up : To obtain clothing/put away  Lower Body Dressing/Undressing Lower body dressing   What is the patient wearing?: Underwear, Pants, Socks, Shoes Underwear - Performed by patient: Thread/unthread right underwear leg, Thread/unthread left underwear leg, Pull underwear up/down   Pants- Performed by patient: Thread/unthread right pants leg, Thread/unthread left pants leg, Pull pants up/down       Socks - Performed by patient: Don/doff right sock, Don/doff left  sock   Shoes - Performed by patient: Don/doff right shoe, Don/doff left shoe            Lower body assist Assist Level: Supervision or verbal cues      Toileting Toileting   Toileting steps completed by patient: Adjust clothing prior to toileting, Performs perineal hygiene, Adjust clothing after toileting    Toileting Assistive Devices: Grab bar or rail  Toileting assist Assist level: Supervision or verbal cues   Transfers Chair/bed transfer   Chair/bed transfer method: Ambulatory Chair/bed transfer assist level: Supervision or verbal cues Chair/bed transfer assistive device: Medical sales representative     Max distance: 150 Assist level: Supervision or verbal cues   Wheelchair   Type: Manual Max wheelchair distance: 150 Assist Level: Supervision or verbal cues  Cognition Comprehension Comprehension assist level: Follows basic conversation/direction with extra time/assistive device  Expression Expression assist level: Expresses complex 90% of the time/cues < 10% of the time, Expresses basic 90% of the time/requires cueing < 10% of the time.  Social Interaction Social Interaction assist level: Interacts appropriately 90% of the time - Needs monitoring or encouragement for participation or interaction.  Problem Solving Problem solving assist level: Solves basic 75 - 89% of the time/requires cueing 10 - 24% of the time  Memory Memory assist level: Recognizes or recalls 90% of the time/requires cueing < 10% of the time   Medical Problem List and Plan: 1. Functional deficits secondary to pontine infarct with diplopia, speech, and balance deficits  -Cont CIR  -follow up with neuro regarding potential location of infarct, ?INO 2. DVT Prophylaxis/Anticoagulation: Subcutaneous heparin. Monitor platelet counts and any signs of bleeding 3. Pain Management: Tylenol as needed 4. End-stage renal disease. Continue hemodialysis as per renal services. HD will be after therapies to avoid conflict with therapy day 5. Neuropsych: This patient is capable of making decisions on his own behalf. 6. Skin/Wound Care: Routine skin checks. Encourage PO  7. Fluids/Electrolytes/Nutrition: Routine I&O's, as per nephrology 8. Seizure disorder. Keppra 1000 mg daily. Takes extra 500 mg tablet Monday Wednesday  Friday after dialysis.   -no current seizure activity 9. Diabetes mellitus of peripheral neuropathy. Hemoglobin A1c 7.7.   -follow for pattern  -Improved, will cont to monitor, fasting CBG in 149 this AM,  Insulin NPH 7 units twice a day.  will cont to monitor  10. History of gout. Continue Zyloprim daily, colchicine 0.6 mg daily. Follow for gout flareups 11. Hyperlipidemia. Zocor 12. HTN  restarted lisinopril , elevated this am monitor    LOS (Days) 5 A FACE TO FACE EVALUATION WAS PERFORMED  Charlett Blake 07/18/2015 4:48 PM

## 2015-07-19 ENCOUNTER — Inpatient Hospital Stay (HOSPITAL_COMMUNITY): Payer: Medicare Other

## 2015-07-19 ENCOUNTER — Inpatient Hospital Stay (HOSPITAL_COMMUNITY): Payer: Medicare Other | Admitting: Occupational Therapy

## 2015-07-19 ENCOUNTER — Inpatient Hospital Stay (HOSPITAL_COMMUNITY): Payer: Medicare Other | Admitting: Physical Therapy

## 2015-07-19 LAB — GLUCOSE, CAPILLARY
GLUCOSE-CAPILLARY: 141 mg/dL — AB (ref 65–99)
GLUCOSE-CAPILLARY: 223 mg/dL — AB (ref 65–99)
GLUCOSE-CAPILLARY: 73 mg/dL (ref 65–99)
Glucose-Capillary: 146 mg/dL — ABNORMAL HIGH (ref 65–99)
Glucose-Capillary: 56 mg/dL — ABNORMAL LOW (ref 65–99)
Glucose-Capillary: 162 mg/dL — ABNORMAL HIGH (ref 65–99)
Glucose-Capillary: 172 mg/dL — ABNORMAL HIGH (ref 65–99)

## 2015-07-19 NOTE — Progress Notes (Signed)
Subjective:  Tired from therapy but feels well.   Objective Filed Vitals:   07/18/15 2158 07/18/15 2207 07/18/15 2237 07/19/15 0550  BP: 130/65 121/64 144/55 153/59  Pulse: 78 79 74 71  Temp:   98.1 F (36.7 C) 98.7 F (37.1 C)  TempSrc:   Oral Oral  Resp: 16 16 18 18   Height:      Weight:  99.9 kg (220 lb 3.8 oz)  99.3 kg (218 lb 14.7 oz)  SpO2:   94% 97%   Physical Exam General: alert and oriented. No acute distress Heart: RRR  Lungs: CTA, unlabored Abdomen: obese, soft, nontender +BS Extremities: no edema Dialysis Access: L AVG +b/t  Dialysis Orders: Center: [facility] on MWF . EDW 99 kgs HD Bath 2.0 K 2.0 Ca Time 4 hours 15 minutes Heparin NO HEPARIN. Access LUA AVG BFR 450 DFR 800 manual  Venofer 50 mg IV weekly  Calcitriol 1.25 mcg PO MWF with HD  Assessment/Plan: 1.  pontine CVA: Per primary/Neurology following. Now in rehab 2. ESRD - MWF [facility] pending  tomorrow.  3. Hypertension/volume -  On lisinopril here- also on metop at home. edw ok. No volume excess 4. Anemia - Last HGB 12  Follow CBC. 5. Metabolic bone disease - Ca+ 8.5, Phos 3.5 Continue binders, calcitriol.  6. Nutrition -  Renal/Carb mod diet with fld restrictions. Albumin 3.1.nepro. Has renal vit. 7. DM: Per primary.  Shelle Iron, NP [facility] [phone] 07/19/2015,1:33 PM  LOS: 6 days   Pt seen, examined and agree w A/P as above.  Kelly Splinter MD pager [phone]    cell [phone] 07/19/2015, 6:27 PM    Additional Objective Labs: Basic Metabolic Panel:  Recent Labs Lab 07/13/15 0812 07/13/15 1735 07/16/15 0027  NA 136 134* 132*  K 4.0 4.1 4.4  CL 92* 94* 91*  CO2 34* 33* 28  GLUCOSE 82 181* 156*  BUN 34* 16 46*  CREATININE 9.80* 5.88* 10.84*  CALCIUM 8.8* 8.6* 8.5*  PHOS 4.9* 2.1* 3.5   Liver Function Tests:  Recent Labs Lab 07/13/15 0812 07/13/15 1735 07/16/15 0027  ALBUMIN 3.0* 3.2* 3.1*   No results for  input(s): LIPASE, AMYLASE in the last 168 hours. CBC:  Recent Labs Lab 07/13/15 0812 07/13/15 1735  WBC 6.5 7.2  HGB 11.7* 12.0*  HCT 35.1* 36.2*  MCV 85.2 85.8  PLT 162 162   Blood Culture    Component Value Date/Time   SDES BLOOD LEFT HAND 06/11/2013 2059   SPECREQUEST BOTTLES DRAWN AEROBIC AND ANAEROBIC 10CC 06/11/2013 2059   CULT  06/11/2013 2059    NO GROWTH 5 DAYS Performed at [facility] 06/18/2013 FINAL 06/11/2013 2059    Cardiac Enzymes: No results for input(s): CKTOTAL, CKMB, CKMBINDEX, TROPONINI in the last 168 hours. CBG:  Recent Labs Lab 07/18/15 1730 07/18/15 2240 07/19/15 0407 07/19/15 1155 07/19/15 1218  GLUCAP 135* 146* 141* 56* 73   Iron Studies: No results for input(s): IRON, TIBC, TRANSFERRIN, FERRITIN in the last 72 hours. @lablastinr3 @ Studies/Results: No results found. Medications:   . allopurinol  100 mg Oral Daily  . amitriptyline  25 mg Oral QHS  . antiseptic oral rinse  7 mL Mouth Rinse q12n4p  . aspirin EC  81 mg Oral Daily  . chlorhexidine  15 mL Mouth Rinse BID  . cinacalcet  60 mg Oral BID WC  . clopidogrel  75 mg Oral Daily  . colchicine  0.6 mg  Oral Daily  . feeding supplement (NEPRO CARB STEADY)  237 mL Oral BID BM  . heparin  5,000 Units Subcutaneous 3 times per day  . insulin aspart  0-9 Units Subcutaneous TID WC  . insulin NPH Human  7 Units Subcutaneous BID AC & HS  . levETIRAcetam  1,000 mg Oral Daily  . levETIRAcetam  500 mg Oral Q M,W,F-2000  . lisinopril  20 mg Oral QHS  . multivitamin  1 tablet Oral QHS  . sevelamer carbonate  800 mg Oral TID WC  . simvastatin  20 mg Oral q1800

## 2015-07-19 NOTE — Consult Note (Signed)
   [facility] CM Inpatient Consult   07/19/2015  Ernest [name] 12/29/1945 [id number] Patient evaluated for community based chronic disease management services with [facility] as a benefit of patient's Medicare Insurance. Spoke with patient and his wife, [name], at bedside to explain [facility] services. Consent form signed and folder with contact information given.  Patient will receive post discharge transition of care call and will be evaluated for monthly home visits for assessments and disease process education.   Made Inpatient Case Manager aware that [facility] Management following. Of note, [facility] Management services does not replace or interfere with any services that are arranged by inpatient case management or social work.  For additional questions or referrals please contact:   Natividad Brood, RN BSN [facility] Liaison  [phone] business mobile phone

## 2015-07-19 NOTE — Progress Notes (Signed)
Initial Nutrition Assessment  DOCUMENTATION CODES:   Obesity unspecified  INTERVENTION:   Continue Nepro Shake po BID, each supplement provides 425 kcal and 19 grams protein.  Encourage adequate PO intake.   NUTRITION DIAGNOSIS:   Increased nutrient needs related to chronic illness as evidenced by estimated needs; ongoing  GOAL:   Patient will meet greater than or equal to 90% of their needs; met  MONITOR:   PO intake, Labs, Weight trends, Skin, I & O's  REASON FOR ASSESSMENT:   Malnutrition Screening Tool    ASSESSMENT:   69 y.o. right handed male with history of end-stage renal disease with hemodialysis MWF, hypertension, seizure disorder maintained on Keppra, diabetes mellitus peripheral neuropathy, CAD with stenting and CVA with little residual weakness . Presented 07/11/2015 with diplopia and slurred speech  Meal completion has been 100%. Pt reports having a good appetite currently with no other difficulties. Pt does reports having a decreased appetite PTA at home however was still consuming at least 2 meals a day. Pt reports having weight los, however has been intentional. Pt currently has Nepro Shake ordered and has been consuming them. RD to continue with current orders. Pt with no observed significant fat or muscle mass loss.   Labs and medications reviewed.   Diet Order:  Diet renal/carb modified with fluid restriction Diet-HS Snack?: Nothing; Room service appropriate?: Yes; Fluid consistency:: Thin  Skin:  Wound (see comment) (skin tear on left ankle)  Last BM:  10/4  Height:   Ht Readings from Last 1 Encounters:  07/13/15 5.9" (0.15 m)    Weight:   Wt Readings from Last 1 Encounters:  07/19/15 218 lb 14.7 oz (99.3 kg)    Ideal Body Weight:  72.7 kg  BMI:  Body Mass Index: 32.45 kg/(m^2).  Estimated Nutritional Needs:   Kcal:  7445-1460  Protein:  105-115 grams  Fluid:  2.2 L/day  EDUCATION NEEDS:   No education needs identified at this  time  Corrin Parker, MS, RD, LDN Pager # [phone] After hours/ weekend pager # [phone]

## 2015-07-19 NOTE — Progress Notes (Signed)
[facility] PHYSICAL MEDICINE & REHABILITATION     PROGRESS NOTE    Subjective/Complaints:   ROS: Pt denies fever, rash/itching, headache,  nausea, vomiting, abdominal pain, diarrhea, chest pain, shortness of breath, palpitations,   Objective: Vital Signs: Blood pressure 153/59, pulse 71, temperature 98.7 F (37.1 C), temperature source Oral, resp. rate 18, height 5.9" (0.15 m), weight 99.3 kg (218 lb 14.7 oz), SpO2 97 %. No results found. No results for input(s): WBC, HGB, HCT, PLT in the last 72 hours. No results for input(s): NA, K, CL, GLUCOSE, BUN, CREATININE, CALCIUM in the last 72 hours.  Invalid input(s): CO CBG (last 3)   Recent Labs  07/18/15 1730 07/18/15 2240 07/19/15 0407  GLUCAP 135* 146* 141*    Wt Readings from Last 3 Encounters:  07/19/15 99.3 kg (218 lb 14.7 oz)  07/13/15 95.8 kg (211 lb 3.2 oz)  07/06/15 102.059 kg (225 lb)    Physical Exam:  Constitutional: He appears well-developed and well-nourished.  HENT: oral mucosa pink and moist.  Head: Normocephalic and atraumatic.  Eyes: No scleral icterus.  EOMI Neck: Normal range of motion. Neck supple. No JVD present. No tracheal deviation present. No thyromegaly present.  Cardiovascular: Normal rate and regular rhythm. no murmurs or rubs Respiratory: Effort normal and breath sounds normal. No respiratory distress. No wheezes or rales GI: Soft. Bowel sounds are normal. He exhibits no distension.  Neurological: He is alert.  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  Speech is a bit dysarthric but intelligible. Awareness of deficits. Follows commands. MMT: 4+ to 5/5 bilateral UE delt,bicep,tricep, wrist, fingers LE: 4/5 HF, KE and ADF/APF bilaterally.   Skin: Skin is warm and dry.  Psychiatric: He has a normal mood and affect. His behavior is normal   Assessment/Plan: 1. Functional deficits secondary to right pontine infarct which require 3+ hours per day of interdisciplinary  therapy in a comprehensive inpatient rehab setting. Physiatrist is providing close team supervision and 24 hour management of active medical problems listed below. Physiatrist and rehab team continue to assess barriers to discharge/monitor patient progress toward functional and medical goals.  Function:  Bathing Bathing position   Position: Shower  Bathing parts Body parts bathed by patient: Right arm, Left arm, Chest, Abdomen, Front perineal area, Buttocks, Right upper leg, Left upper leg, Right lower leg, Left lower leg Body parts bathed by helper: Back  Bathing assist Assist Level: Supervision or verbal cues      Upper Body Dressing/Undressing Upper body dressing   What is the patient wearing?: Pull over shirt/dress     Pull over shirt/dress - Perfomed by patient: Thread/unthread right sleeve, Thread/unthread left sleeve, Put head through opening, Pull shirt over trunk          Upper body assist Assist Level: Set up   Set up : To obtain clothing/put away  Lower Body Dressing/Undressing Lower body dressing   What is the patient wearing?: Underwear, Pants, Socks, Shoes Underwear - Performed by patient: Thread/unthread right underwear leg, Thread/unthread left underwear leg, Pull underwear up/down   Pants- Performed by patient: Thread/unthread right pants leg, Thread/unthread left pants leg, Pull pants up/down       Socks - Performed by patient: Don/doff right sock, Don/doff left sock   Shoes - Performed by patient: Don/doff right shoe, Don/doff left shoe            Lower body assist Assist Level: Supervision or verbal cues      Child psychotherapist  steps completed by patient: Adjust clothing prior to toileting, Performs perineal hygiene, Adjust clothing after toileting   Toileting Assistive Devices: Grab bar or rail  Toileting assist Assist level: Supervision or verbal cues   Transfers Chair/bed transfer   Chair/bed transfer method:  Ambulatory Chair/bed transfer assist level: Supervision or verbal cues Chair/bed transfer assistive device: Walker, Air cabin crew     Max distance: 150 Assist level: Supervision or verbal cues (supervision with RW; min assist without AD)   Wheelchair   Type: Manual Max wheelchair distance: 150 Assist Level: Supervision or verbal cues  Cognition Comprehension Comprehension assist level: Follows basic conversation/direction with extra time/assistive device  Expression Expression assist level: Expresses complex 90% of the time/cues < 10% of the time, Expresses basic 90% of the time/requires cueing < 10% of the time.  Social Interaction Social Interaction assist level: Interacts appropriately 90% of the time - Needs monitoring or encouragement for participation or interaction.  Problem Solving Problem solving assist level: Solves basic 75 - 89% of the time/requires cueing 10 - 24% of the time  Memory Memory assist level: Recognizes or recalls 90% of the time/requires cueing < 10% of the time   Medical Problem List and Plan: 1. Functional deficits secondary to pontine infarct with diplopia, speech, and balance deficits  -Cont CIR  -follow up with neuro regarding potential location of infarct, ?INO 2. DVT Prophylaxis/Anticoagulation: Subcutaneous heparin. Monitor platelet counts and any signs of bleeding 3. Pain Management: Tylenol as needed 4. End-stage renal disease. Continue hemodialysis as per renal services. HD will be after therapies to avoid conflict with therapy day 5. Neuropsych: This patient is capable of making decisions on his own behalf. 6. Skin/Wound Care: Routine skin checks. Encourage PO  7. Fluids/Electrolytes/Nutrition: Routine I&O's, as per nephrology 8. Seizure disorder. Keppra 1000 mg daily. Takes extra 500 mg tablet Monday Wednesday Friday after dialysis.   -no current seizure activity 9. Diabetes mellitus of peripheral neuropathy. Hemoglobin  A1c 7.7.   -follow for pattern  -Improved, will cont to monitor, fasting CBG in 141 this AM,  Insulin NPH 7 units twice a day.  will cont to monitor  10. History of gout. Continue Zyloprim daily, colchicine 0.6 mg daily. Follow for gout flareups 11. Hyperlipidemia. Zocor 12. HTN  restarted lisinopril , am BP elevated ok during the day and in HD, no changes for now    LOS (Days) 6 A FACE TO FACE EVALUATION WAS PERFORMED  KIRSTEINS,ANDREW E 07/19/2015 9:09 AM

## 2015-07-19 NOTE — Progress Notes (Signed)
Social Work Patient ID: Ernest [name], male   DOB: July 08, 1946, 69 y.o.   MRN: [id number] Team feels pt will be ready to go home on Thursday, have checked with MD he is fine with this and have left a message for pt's wife on her cell phone. Will have team conference tomorrow.

## 2015-07-19 NOTE — Progress Notes (Signed)
Occupational Therapy Session Note  Patient Details  Name: Ernest [name] MRN: [id number] Date of Birth: 15-Sep-1946  Today's Date: 07/19/2015 OT Individual Time: [id number] OT Individual Time Calculation (min): 60 min    Short Term Goals: Week 1:  OT Short Term Goal 1 (Week 1): STG = LTGs due to ELOS  Skilled Therapeutic Interventions/Progress Updates:    Treatment session with focus on trunk control, dynamic standing balance, and bathroom transfers.  Pt reports no clean clothes this session and deferring bathing till next session.  Ambulated to therapy gym with RW and distant supervision with no LOB.  Engaged in [name] and trunk control with use of 3# dowel with 3 sets of 10 chest presses and bicep curls in unsupported sitting (with mat elevated for feet off floor).  [name] toss with pt batting ball back to therapist with weighted dowel rod to further address sitting balance and strengthening.  Pt required rest breaks between each activity.  Dynamic standing balance to promote weight shifting to Lt in standing with pt crossing midline to horse shoes and then place them on basketball goal reaching outside BOS to Lt.  Visual tracking task with pt demonstrating improved fluidity and compensatory strategies.  Pt returned to room and completed toilet transfer and toileting with distant supervision.  Therapy Documentation Precautions:  Precautions Precautions: Fall Precaution Comments: vision deficits Restrictions Weight Bearing Restrictions: No Pain:  Pt with no c/o pain  See Function Navigator for Current Functional Status.   Therapy/Group: Individual Therapy  [name] 07/19/2015, 11:20 AM

## 2015-07-19 NOTE — Plan of Care (Signed)
Problem: RH PAIN MANAGEMENT Goal: RH STG PAIN MANAGED AT OR BELOW PT'S PAIN GOAL Less than 3,on 1 to 10 scale.  Outcome: Progressing No c/o pain

## 2015-07-19 NOTE — Progress Notes (Signed)
Physical Therapy Session Note  Patient Details  Name: GLEN DIMSDALE MRN: [id number] Date of Birth: [birth year]  Today's Date: 07/19/2015 PT Individual Time: 0800-0900 PT Individual Time Calculation (min): 60 min   Short Term Goals: Week 1:  PT Short Term Goal 1 (Week 1): =LTG due to estimated length of stay  Skilled Therapeutic Interventions/Progress Updates:    Session focused on addressing functional gait with RW (adjusted to lower setting to improve positioning and decrease shoulder elevation - verbal cues throughout gait as well to relax shoulders), gait without AD with various challenges including horizontal and vertical head turns/quick stops and starts/changes in gait speed to address balance reactions (steady assist at times needed with 1 episode of LOB when looking up requiring min assist), dynamic gait through obstacle course to simulate home and community mobility including navigating cones, stepping over simulated threshold and performing 4" curb step without AD and then again with RW (required min to mod assist (mod when LOB occurred and for step x 3 during trial) and steady assist to supervision level with RW demonstrating significant improvement with safety and balance when using RW, stair negotiation to practice for home access as well as address overall strengthening and balance x 12 steps with L rail, simulated car transfer with verbal cues for technique and hand placement, and performed seated and standing ankle exercises and stretches for increased ROM and strength to assist with balance strategies.   Therapy Documentation Precautions:  Precautions Precautions: Fall Precaution Comments: vision deficits Restrictions Weight Bearing Restrictions: No  Pain:  Denies pain.   See Function Navigator for Current Functional Status.   Therapy/Group: Individual Therapy  Canary Brim Ivory Broad, PT, DPT  07/19/2015, 9:50 AM

## 2015-07-19 NOTE — Progress Notes (Signed)
Physical Therapy Session Note  Patient Details  Name: Ernest [name] MRN: [id number] Date of Birth: [birth year]  Today's Date: 07/19/2015 PT Individual Time: 1100-1200 PT Individual Time Calculation (min): 60 min   Short Term Goals: Week 1:  PT Short Term Goal 1 (Week 1): =LTG due to estimated length of stay  Skilled Therapeutic Interventions/Progress Updates:    Pt received supine in bed, no c/o pain and agreeable to treatment. Gait to therapy gym with supervision and min cues initially to remain inside walker and able to maintain without cues for remainder of trial. Pt instructed in [place] exercises level A and B, and educated in safety of performance at home as well as discussing benefits for LE strengthening and balance. Performed 10 reps of each exercise with min verbal cues after initial demonstration. Stair training for one trial of 12 stairs with BUE on rails, one trial of 12 stairs with L side rail only to simulate home environment. No LOB or unsteadiness noted, however pt did perform step-to pattern for ascent and descent for increased stability and safety. Pt ambulated back to room with RW and supervision and remained supine in bed with all needs in reach and alarm intact at completion of session.   Therapy Documentation Precautions:  Precautions Precautions: Fall Precaution Comments: vision deficits Restrictions Weight Bearing Restrictions: No Pain: Pain Assessment Pain Assessment: No/denies pain Pain Score: 0-No pain   See Function Navigator for Current Functional Status.   Therapy/Group: Individual Therapy  Luberta Mutter 07/19/2015, 12:06 PM

## 2015-07-19 NOTE — Plan of Care (Signed)
Problem: RH BOWEL ELIMINATION Goal: RH STG MANAGE BOWEL WITH ASSISTANCE STG Manage Bowel with Assistance. Mod I  Outcome: Progressing No report of incontinent episode

## 2015-07-19 NOTE — Progress Notes (Signed)
Occupational Therapy Session Note  Patient Details  Name: Ernest [name] MRN: [id number] Date of Birth: [birth year]  Today's Date: 07/19/2015 OT Individual Time: 1330-1400 OT Individual Time Calculation (min): 30 min    Short Term Goals: Week 1:  OT Short Term Goal 1 (Week 1): STG = LTGs due to ELOS  Skilled Therapeutic Interventions/Progress Updates: Therapeutic activity with focus on improved safety awareness, dynamic standing balance, and tub transfers.   Pt received supine in bed, alert and oriented after noon meal.   Pt was instructed to rise from bed and ambulate to ADL apartment to demonstrate his competence with tub transfers, stepping into tub to sit on shower chair.    Pt completed task with min assist however stated that his tub configuration was not similar to trial.  After review with therapist, pt agreed to second trial using tub in tub room.   Pt ambulated to tub room and completed tub transfer with only supervision assist for safety.   Pt agrees to seek supervision assist from his wife s/p d/c.   Pt ambulates back to his room using RW with supervision and returns to bed with all needs within reach and bed alarm activated.     Therapy Documentation Precautions:  Precautions Precautions: Fall Precaution Comments: vision deficits Restrictions Weight Bearing Restrictions: No  Pain: Pain Assessment Pain Assessment: No/denies pain Pain Score: 0-No pain  See Function Navigator for Current Functional Status.   Therapy/Group: Individual Therapy  Anchor [name] 07/19/2015, 2:12 PM

## 2015-07-20 ENCOUNTER — Inpatient Hospital Stay (HOSPITAL_COMMUNITY): Payer: Medicare Other | Admitting: Occupational Therapy

## 2015-07-20 ENCOUNTER — Inpatient Hospital Stay (HOSPITAL_COMMUNITY): Payer: Medicare Other | Admitting: Physical Therapy

## 2015-07-20 DIAGNOSIS — I10 Essential (primary) hypertension: Secondary | ICD-10-CM

## 2015-07-20 LAB — RENAL FUNCTION PANEL
Albumin: 3 g/dL — ABNORMAL LOW (ref 3.5–5.0)
Anion gap: 13 (ref 5–15)
BUN: 60 mg/dL — AB (ref 6–20)
CHLORIDE: 90 mmol/L — AB (ref 101–111)
CO2: 26 mmol/L (ref 22–32)
CREATININE: 10.33 mg/dL — AB (ref 0.61–1.24)
Calcium: 7.5 mg/dL — ABNORMAL LOW (ref 8.9–10.3)
GFR calc Af Amer: 5 mL/min — ABNORMAL LOW (ref >60)
GFR calc non Af Amer: 5 mL/min — ABNORMAL LOW (ref >60)
GLUCOSE: 223 mg/dL — AB (ref 65–99)
POTASSIUM: 4.7 mmol/L (ref 3.5–5.1)
Phosphorus: 5.3 mg/dL — ABNORMAL HIGH (ref 2.5–4.6)
Sodium: 129 mmol/L — ABNORMAL LOW (ref 135–145)

## 2015-07-20 LAB — CBC
HEMATOCRIT: 30.8 % — AB (ref 39.0–52.0)
Hemoglobin: 10.5 g/dL — ABNORMAL LOW (ref 13.0–17.0)
MCH: 28.2 pg (ref 26.0–34.0)
MCHC: 34.1 g/dL (ref 30.0–36.0)
MCV: 82.8 fL (ref 78.0–100.0)
PLATELETS: 168 10*3/uL (ref 150–400)
RBC: 3.72 MIL/uL — ABNORMAL LOW (ref 4.22–5.81)
RDW: 14.9 % (ref 11.5–15.5)
WBC: 6.4 10*3/uL (ref 4.0–10.5)

## 2015-07-20 LAB — GLUCOSE, CAPILLARY
GLUCOSE-CAPILLARY: 166 mg/dL — AB (ref 65–99)
GLUCOSE-CAPILLARY: 96 mg/dL (ref 65–99)
GLUCOSE-CAPILLARY: 137 mg/dL — AB (ref 65–99)
GLUCOSE-CAPILLARY: 94 mg/dL (ref 65–99)
GLUCOSE-CAPILLARY: 143 mg/dL — AB (ref 65–99)

## 2015-07-20 MED ORDER — PENTAFLUOROPROP-TETRAFLUOROETH EX AERO: 1.0000 "application " | INHALATION_SPRAY | CUTANEOUS | Status: DC | PRN

## 2015-07-20 MED ORDER — LIDOCAINE HCL (PF) 1 % IJ SOLN
5.0000 mL | INTRAMUSCULAR | Status: DC | PRN
  Filled 2015-07-20: qty 5

## 2015-07-20 MED ORDER — SODIUM CHLORIDE 0.9 % IV SOLN: 100.0000 mL | INTRAVENOUS | Status: DC | PRN

## 2015-07-20 MED ORDER — LIDOCAINE-PRILOCAINE 2.5-2.5 % EX CREA
1.0000 "application " | TOPICAL_CREAM | CUTANEOUS | Status: DC | PRN
  Filled 2015-07-20: qty 5

## 2015-07-20 MED ORDER — COLCHICINE 0.6 MG PO TABS: 0.3000 mg | ORAL_TABLET | ORAL | Status: DC

## 2015-07-20 MED ORDER — ALTEPLASE 2 MG IJ SOLR
2.0000 mg | Freq: Once | INTRAMUSCULAR | Status: DC | PRN
  Filled 2015-07-20: qty 2

## 2015-07-20 MED ORDER — METOPROLOL TARTRATE 12.5 MG HALF TABLET
12.5000 mg | ORAL_TABLET | Freq: Two times a day (BID) | ORAL | Status: DC
  Administered 2015-07-21 (×2): 12.5 mg via ORAL
  Filled 2015-07-20 (×2): qty 1

## 2015-07-20 MED ORDER — HEPARIN SODIUM (PORCINE) 1000 UNIT/ML DIALYSIS
1000.0000 [IU] | INTRAMUSCULAR | Status: DC | PRN
  Filled 2015-07-20: qty 1

## 2015-07-20 NOTE — Progress Notes (Signed)
Occupational Therapy Discharge Summary  Patient Details  Name: Ernest [name] MRN: [id number] Date of Birth: 16-Dec-1945  Patient has met 9 of 9 long term goals due to improved activity tolerance, improved balance, postural control, ability to compensate for deficits and improved awareness.  Patient to discharge at overall Modified Independent level.  Patient's care partner is independent to provide the necessary assistance at discharge.  No formal family education completed due to Modified Independent level with ADLs, however provided pt with handout of recommendations to share with wife to increase safety.  Reasons goals not met: N/A  Recommendation:  Patient will benefit from ongoing skilled OT services in home health setting to continue to advance functional skills in the area of BADL and Reduce care partner burden.  Equipment: No equipment provided  Reasons for discharge: treatment goals met and discharge from hospital  Patient/family agrees with progress made and goals achieved: Yes  OT Discharge Precautions/Restrictions  Precautions Precautions: Fall Precaution Comments: vision deficits Restrictions Weight Bearing Restrictions: No General PT Missed Treatment Reason: Other (Comment) (pt declined treatment until finishing breakfast) Vital Signs  Pain Pain Assessment Pain Assessment: No/denies pain ADL  See Function Navigator Vision/Perception  Vision- History Baseline Vision/History: Wears glasses;Retinopathy Wears Glasses: Distance only Patient Visual Report: Blurring of vision Vision- Assessment Vision Assessment?: Yes Eye Alignment: Within Functional Limits Ocular Range of Motion: Restricted on the left Tracking/Visual Pursuits: Left eye does not track laterally;Left eye does not track medially Saccades: Undershoots;Overshoots Convergence: Impaired (comment) Diplopia Assessment: Disappears with one eye closed Depth Perception:  Overshoots;Undershoots Perception Comments: WFL  Cognition Overall Cognitive Status: History of cognitive impairments - at baseline Arousal/Alertness: Awake/alert Orientation Level: Oriented X4 Attention: Selective Selective Attention: Appears intact Awareness: Appears intact Safety/Judgment: Appears intact Sensation Sensation Light Touch: Impaired Detail Light Touch Impaired Details: Impaired LLE;Impaired RLE (diminished bilaterally in stocking pattern) Stereognosis: Not tested Hot/Cold: Not tested Proprioception: Impaired Detail Proprioception Impaired Details: Impaired LLE;Impaired RLE Coordination Gross Motor Movements are Fluid and Coordinated: Yes Fine Motor Movements are Fluid and Coordinated: Yes Finger Nose Finger Test: overshoots, most likely attributed to vision as this improves with Lt eye closed 9 Hole Peg Test: Rt: 2:04 and Lt: 2:16 Motor    Mobility  Bed Mobility Bed Mobility: Supine to Sit;Sit to Supine Supine to Sit: 6: Modified independent (Device/Increase time) Sit to Supine: 6: Modified independent (Device/Increase time)  Trunk/Postural Assessment  Cervical Assessment Cervical Assessment: Within Functional Limits Thoracic Assessment Thoracic Assessment: Within Functional Limits Lumbar Assessment Lumbar Assessment: Within Functional Limits Postural Control Postural Control: Deficits on evaluation Righting Reactions: impaired ankle strategies for maintaining balance due to decreased sensation/proprioception BLEs Protective Responses: decreased power production and efficiency of stepping strategies to recover LOB  Balance   Extremity/Trunk Assessment RUE Assessment RUE Assessment: Within Functional Limits LUE Assessment LUE Assessment: Within Functional Limits   See Function Navigator for Current Functional Status.  [name] 07/20/2015, 1:04 PM

## 2015-07-20 NOTE — Progress Notes (Signed)
Physical Therapy Discharge Summary  Patient Details  Name: Ernest [name] MRN: [id number] Date of Birth: 1946/02/26  Today's Date: 07/20/2015 PT Individual Time: 0815-0900 and 1330-1400 PT Individual Time Calculation (min): 45 min and 30 min (total 75 min)    Patient has met 11 of 11 long term goals due to improved activity tolerance, improved balance, improved postural control, increased strength, ability to compensate for deficits, functional use of  right upper extremity and right lower extremity, improved attention, improved awareness and improved coordination.  Patient to discharge at an ambulatory level Modified Independent.   Patient is discharging at modI level overall, however does require supervision for stairs and gait for community distances. Wife not present for any therapy sessions to discuss supervision, however patient given handout with recommendations from PT/OT.   Reasons goals not met: all goals met  Recommendation:  Patient will benefit from ongoing skilled PT services in home health setting to continue to advance safe functional mobility, address ongoing impairments in balance, strength, activity tolerance, and minimize fall risk.  Equipment: No equipment provided  Reasons for discharge: treatment goals met and discharge from hospital  Patient/family agrees with progress made and goals achieved: Yes  PT Discharge Precautions/Restrictions Precautions Precautions: Fall Precaution Comments: vision deficits Restrictions Weight Bearing Restrictions: No Pain Pain Assessment Pain Assessment: No/denies pain Pain Score: 0-No pain Vision/Perception  Vision - Assessment Eye Alignment: Within Functional Limits Ocular Range of Motion: Restricted on the left Tracking/Visual Pursuits: Left eye does not track laterally;Left eye does not track medially Perception Comments: WFL  Cognition Overall Cognitive Status: History of cognitive impairments - at  baseline Arousal/Alertness: Awake/alert Orientation Level: Oriented X4 Attention: Selective Selective Attention: Appears intact Safety/Judgment: Appears intact Sensation Sensation Light Touch: Impaired Detail Light Touch Impaired Details: Impaired LLE;Impaired RLE (diminished bilaterally stocking pattern) Stereognosis: Not tested Hot/Cold: Not tested Proprioception: Impaired Detail Proprioception Impaired Details: Impaired LLE;Impaired RLE Additional Comments: Inaccurate 4/10 RLE, 3/10 LLE Coordination Gross Motor Movements are Fluid and Coordinated: Yes Fine Motor Movements are Fluid and Coordinated: Yes Heel Shin Test: WNL Motor  Motor Motor: Within Functional Limits Motor - Discharge Observations: impaired balance with sensory impairments BLEs, decreased LE strength and power production  Mobility Bed Mobility Bed Mobility: Supine to Sit;Sit to Supine Supine to Sit: 6: Modified independent (Device/Increase time) Sit to Supine: 6: Modified independent (Device/Increase time) Transfers Transfers: Yes Stand Pivot Transfers: 6: Modified independent (Device/Increase time) Stand Pivot Transfer Details (indicate cue type and reason): with RW Locomotion  Ambulation Ambulation: Yes Ambulation/Gait Assistance: 6: Modified independent (Device/Increase time) Ambulation Distance (Feet): 200 Feet Assistive device: Rolling walker Gait Gait: Yes Gait Pattern: Impaired Gait Pattern: Decreased stride length;Lateral hip instability;Narrow base of support;Poor foot clearance - left;Poor foot clearance - right Stairs / Additional Locomotion Stairs: Yes Stairs Assistance: 5: Supervision Stairs Assistance Details: Verbal cues for precautions/safety Stair Management Technique: One rail Left;Alternating pattern;Forwards Number of Stairs: 12 Height of Stairs: 6 Ramp: 6: Modified independent (Device) Curb: 5: Building services engineer Mobility: No (pt is ambulatory)   Trunk/Postural Assessment  Cervical Assessment Cervical Assessment: Within Functional Limits Thoracic Assessment Thoracic Assessment: Within Functional Limits Lumbar Assessment Lumbar Assessment: Within Functional Limits Postural Control Postural Control: Deficits on evaluation Righting Reactions: impaired ankle strategies for maintaining balance due to decreased sensation/proprioception BLEs Protective Responses: decreased power production and efficiency of stepping strategies to recover LOB  Balance Standardized Balance Assessment Standardized Balance Assessment: Berg Balance Test;Timed Up and Go Test Berg Balance Test Sit to Stand: Able  to stand  independently using hands Standing Unsupported: Able to stand safely 2 minutes Sitting with Back Unsupported but Feet Supported on Floor or Stool: Able to sit safely and securely 2 minutes Stand to Sit: Controls descent by using hands Transfers: Able to transfer safely, definite need of hands Standing Unsupported with Eyes Closed: Able to stand 10 seconds with supervision Standing Ubsupported with Feet Together: Able to place feet together independently and stand for 1 minute with supervision From Standing, Reach Forward with Outstretched Arm: Reaches forward but needs supervision From Standing Position, Pick up Object from Floor: Able to pick up shoe, needs supervision From Standing Position, Turn to Look Behind Over each Shoulder: Looks behind one side only/other side shows less weight shift Turn 360 Degrees: Able to turn 360 degrees safely but slowly Standing Unsupported, Alternately Place Feet on Step/Stool: Able to complete >2 steps/needs minimal assist Standing Unsupported, One Foot in Front: Able to take small step independently and hold 30 seconds Standing on One Leg: Tries to lift leg/unable to hold 3 seconds but remains standing independently Total Score: 36 Timed Up and Go Test TUG: Normal TUG Normal TUG (seconds): 23 Static  Sitting Balance Static Sitting - Balance Support: No upper extremity supported;Feet supported Static Sitting - Level of Assistance: 6: Modified independent (Device/Increase time) Dynamic Sitting Balance Dynamic Sitting - Balance Support: No upper extremity supported;Feet supported;During functional activity Dynamic Sitting - Level of Assistance: 6: Modified independent (Device/Increase time) Dynamic Sitting - Balance Activities: Forward lean/weight shifting;Reaching for objects;Reaching across midline Static Standing Balance Static Standing - Level of Assistance: 6: Modified independent (Device/Increase time) Static Standing - Comment/# of Minutes: x2 min in [place] Static Stance: Eyes closed Static Stance: Eyes Closed: supervision x30 sec Dynamic Standing Balance Dynamic Standing - Balance Support: During functional activity;Bilateral upper extremity supported Dynamic Standing - Level of Assistance: 6: Modified independent (Device/Increase time) Dynamic Standing - Balance Activities: Reaching for objects;Forward lean/weight shifting Extremity Assessment  RUE Assessment RUE Assessment: Within Functional Limits LUE Assessment LUE Assessment: Within Functional Limits RLE Assessment RLE Assessment: Within Functional Limits (5/5 throughout with exception of hip flexion, plantarflexion 4/5) LLE Assessment LLE Assessment: Within Functional Limits (5/5 throughout with exception of hip flexion 4/5)  Skilled Therapeutic Intervention: 0815-0900: Pt received supine in bed eating breakfast, declines treatment until breakfast is finished. Missed 15 minutes skilled PT. Gait with RW x200 with mod I, no cues or assistance needed for safety or management of RW. Performed furniture transfer, bed mobility, car transfer all with mod I. Performed Berg Balance Scale to assess functional balance; demonstrated significant improvements over eval from 22/56 to 36/56 indicating  significant improvement in balance and  safety, however still suggestive of medium/moderate fall risk. Assessed LE strength/sensation/coordination as below. Performed TUG at 23 seconds without AD, 25 seconds with AD. 5x sit to stand without UEs 17.7 seconds with poor eccentric control stand >sit. Pt ambulated back to room x200' with RW and mod I. Remained supine in bed with all needs in reach at completion of session.   1330-1400: Pt received asleep in bed, easy to arouse and agreeable to treatment. Focus of session on community gait with RW to assess independence in community and activity tolerance. Performed 3 bouts of ambulation x400-500' each with supervision and seated rest breaks between due to fatigue. Ambulation environment included uneven surfaces, narrow walkways, cognitive dual task and distractions. Reviewed recommendation handout with pt, including use of RW for home and community ambulation and supervision on stairs. Pt returned to  room and remained seated in bed with all needs in reach at completion of session.    See Function Navigator for Current Functional Status.  Benjiman Core Tygielski 07/20/2015, 10:45 AM

## 2015-07-20 NOTE — Progress Notes (Signed)
Social Work Patient ID: Ernest [name], male   DOB: 1946/02/28, 69 y.o.   MRN: [id number] Met with pt to discuss team conference and progression toward his goals of supervision-mod/i level. Aware of discharge tomorrow and he feels ready for this. Have left two messages for his wife, he reports she knows about his discharge tomorrow. Have arranged home health and [facility] follow up. Will see tomorrow for any  Last minute questions by wife.

## 2015-07-20 NOTE — Progress Notes (Signed)
Social Work Elease Hashimoto, LCSW Social Worker Signed  Patient Care Conference 07/20/2015 12:48 PM    Expand All Collapse All   Inpatient RehabilitationTeam Conference and Plan of Care Update Date: 07/20/2015   Time: 11;00 AM     Patient Name: Ernest [name]       Medical Record Number: [id number]  Date of Birth: 06-Sep-1946 Sex: Male         Room/Bed: 4W13C/4W13C-01 Payor Info: Payor: MEDICARE / Plan: MEDICARE PART A AND B / Product Type: *No Product type* /    Admitting Diagnosis: PONTINE CVA   Admit Date/Time:  07/13/2015  4:30 PM Admission Comments: No comment available   Primary Diagnosis:  Right pontine cerebrovascular accident [facility]) Principal Problem: Right pontine cerebrovascular accident [facility])    Patient Active Problem List     Diagnosis  Date Noted   .  Right pontine cerebrovascular accident ([place])  07/13/2015   .  Stroke ([place])  07/11/2015   .  Diplopia  07/11/2015   .  Obstructive sleep apnea  06/16/2013   .  Hyperlipidemia     .  ESRD (end stage renal disease) ([place])     .  Erectile dysfunction     .  Obesity     .  Hypertension     .  Hepatitis C     .  COPD (chronic obstructive pulmonary disease) ([place])     .  Coronary artery disease     .  Ejection fraction < 50%     .  Chronic combined systolic and diastolic CHF (congestive heart failure) ([place])     .  Cardiomyopathy, ischemic  06/10/2013   .  Uncontrolled type 2 diabetes mellitus with end-stage renal disease ([place])  06/08/2013   .  AODM  02/28/2009   .  GOUT, UNSPECIFIED  02/28/2009   .  EXUDATIVE RETINOPATHY  02/28/2009   .  CKD (chronic kidney disease) stage V requiring chronic dialysis ([place])  02/28/2009     Expected Discharge Date: Expected Discharge Date: 07/21/15  Team Members Present: Physician leading conference: Dr. Alysia Penna Social Worker Present: Ovidio Kin, LCSW Nurse Present: Heather Roberts, RN PT Present: Kem Parkinson, PT OT Present: [name], OT SLP Present: Gunnar Fusi,  SLP PPS Coordinator present : Daiva Nakayama, RN, CRRN        Current Status/Progress  Goal  Weekly Team Focus   Medical     BP still elevated  Mod I  med adjustment for BP   Bowel/Bladder     Continent of bowel. LBM 07/19/15. HD-M, W, F (Oliguric, per report)  Pt to remain continent of bowel and bladder   Monitor   Swallow/Nutrition/ Hydration       na         ADL's     supervision overall  Mod I overall  bathroom transfers, dynamic standing balance, and pt/family education   Mobility     mod I bed mobility, supervision transfers and gait, stairs CGA/supervision  mod I overall, supervision stairs and ambulation for community distances  activity tolerance, NMR, LE strength, HEP    Communication     Supervision-Mod I   Mod I   education    Safety/Cognition/ Behavioral Observations    Supervision   Supervision   education    Pain     No c/o pain  <3  Assess for nonverbal cues of pain   Skin     Keloid to RUE related to old  fistula. LUE with new HD graft   No skin breakdown  Assess q shift      *See Care Plan and progress notes for long and short-term goals.    Barriers to Discharge:  Multiple medical issues     Possible Resolutions to Barriers:   [facility] referral, [place]     Discharge Planning/Teaching Needs:   Team feels ready for discharge tomorrow-met goals and wife was here Monday to observe, can not proivde care to him, due to own issues. Referral made to [facility]        Team Discussion:    Meeting his goals of mod/i-supervision level. Started back lon his home blood pressure medicines-toprol. [facility] to follow in the community at discharge. Wife was here Monday. Ready for DC tomorrow.   Revisions to Treatment Plan:    None    Continued Need for Acute Rehabilitation Level of Care: The patient requires daily medical management by a physician with specialized training in physical medicine and rehabilitation for the following conditions: Daily direction of a multidisciplinary physical  rehabilitation program to ensure safe treatment while eliciting the highest outcome that is of practical value to the patient.: Yes Daily medical management of patient stability for increased activity during participation in an intensive rehabilitation regime.: Yes Daily analysis of laboratory values and/or radiology reports with any subsequent need for medication adjustment of medical intervention for : Neurological problems;Other  Elease Hashimoto 07/20/2015, 12:48 PM                  Patient ID: Ernest [name], male   DOB: [birth year], 69 y.o.   MRN: [id number]

## 2015-07-20 NOTE — Patient Care Conference (Signed)
Inpatient RehabilitationTeam Conference and Plan of Care Update Date: 07/20/2015   Time: 11;00 AM    Patient Name: Ernest [name]      Medical Record Number: [id number]  Date of Birth: [birth year] Sex: Male         Room/Bed: 4W13C/4W13C-01 Payor Info: Payor: MEDICARE / Plan: MEDICARE PART A AND B / Product Type: *No Product type* /    Admitting Diagnosis: PONTINE CVA  Admit Date/Time:  07/13/2015  4:30 PM Admission Comments: No comment available   Primary Diagnosis:  Right pontine cerebrovascular accident [facility]) Principal Problem: Right pontine cerebrovascular accident [facility])  Patient Active Problem List   Diagnosis Date Noted  . Right pontine cerebrovascular accident ([place]) 07/13/2015  . Stroke ([place]) 07/11/2015  . Diplopia 07/11/2015  . Obstructive sleep apnea 06/16/2013  . Hyperlipidemia   . ESRD (end stage renal disease) ([place])   . Erectile dysfunction   . Obesity   . Hypertension   . Hepatitis C   . COPD (chronic obstructive pulmonary disease) ([place])   . Coronary artery disease   . Ejection fraction < 50%   . Chronic combined systolic and diastolic CHF (congestive heart failure) ([place])   . Cardiomyopathy, ischemic 06/10/2013  . Uncontrolled type 2 diabetes mellitus with end-stage renal disease ([place]) 06/08/2013  . AODM 02/28/2009  . GOUT, UNSPECIFIED 02/28/2009  . EXUDATIVE RETINOPATHY 02/28/2009  . CKD (chronic kidney disease) stage V requiring chronic dialysis ([place]) 02/28/2009    Expected Discharge Date: Expected Discharge Date: 07/21/15  Team Members Present: Physician leading conference: Dr. Alysia Penna Social Worker Present: Ovidio Kin, LCSW Nurse Present: Heather Roberts, RN PT Present: Kem Parkinson, PT OT Present: [name], OT SLP Present: Gunnar Fusi, SLP PPS Coordinator present : Daiva Nakayama, RN, CRRN     Current Status/Progress Goal Weekly Team Focus  Medical   BP still elevated  Mod I  med adjustment for BP   Bowel/Bladder   Continent of  bowel. LBM 07/19/15. HD-M, W, F (Oliguric, per report)  Pt to remain continent of bowel and bladder  Monitor   Swallow/Nutrition/ Hydration     na        ADL's   supervision overall  Mod I overall  bathroom transfers, dynamic standing balance, and pt/family education   Mobility   mod I bed mobility, supervision transfers and gait, stairs CGA/supervision  mod I overall, supervision stairs and ambulation for community distances  activity tolerance, NMR, LE strength, HEP    Communication   Supervision-Mod I   Mod I   education    Safety/Cognition/ Behavioral Observations  Supervision   Supervision   education    Pain   No c/o pain  <3  Assess for nonverbal cues of pain   Skin   Keloid to RUE related to old fistula. LUE with new HD graft  No skin breakdown  Assess q shift      *See Care Plan and progress notes for long and short-term goals.  Barriers to Discharge: Multiple medical issues    Possible Resolutions to Barriers:  [facility] referral, [place]    Discharge Planning/Teaching Needs:  Team feels ready for discharge tomorrow-met goals and wife was here Monday to observe, can not proivde care to him, due to own issues. Referral made to [facility]       Team Discussion:  Meeting his goals of mod/i-supervision level. Started back lon his home blood pressure medicines-toprol. [facility] to follow in the community at discharge. Wife was here Monday. Ready for  DC tomorrow.  Revisions to Treatment Plan:  None   Continued Need for Acute Rehabilitation Level of Care: The patient requires daily medical management by a physician with specialized training in physical medicine and rehabilitation for the following conditions: Daily direction of a multidisciplinary physical rehabilitation program to ensure safe treatment while eliciting the highest outcome that is of practical value to the patient.: Yes Daily medical management of patient stability for increased activity during participation in an intensive  rehabilitation regime.: Yes Daily analysis of laboratory values and/or radiology reports with any subsequent need for medication adjustment of medical intervention for : Neurological problems;Other  Ernest [name] 07/20/2015, 12:48 PM

## 2015-07-20 NOTE — Discharge Summary (Signed)
Discharge summary job # [id number]

## 2015-07-20 NOTE — Discharge Summary (Signed)
NAME:  Ernest [name], Ernest [name] NO.:  [id number]  MEDICAL RECORD NO.:  [id number]  LOCATION:  4W13C                        FACILITY:  [place]  PHYSICIAN:  Lauraine Rinne, P.A.  DATE OF BIRTH:  1946-06-22  DATE OF ADMISSION:  07/13/2015 DATE OF DISCHARGE:  07/21/2015                              DISCHARGE SUMMARY   DISCHARGE DIAGNOSES: 1. Functional deficits secondary to Pontine infarct. 2. Subcutaneous heparin for DVT prophylaxis. 3. End-stage renal disease with hemodialysis. 4. Seizure disorder. 5. Diabetes mellitus. 6. Peripheral neuropathy. 7. History of gout. 8. Hyperlipidemia. 9. Hypertension.  HISTORY OF PRESENT ILLNESS:  This is a 69 year old right-handed male, with history of end-stage renal disease with hemodialysis, hypertension, seizure disorder, diabetes mellitus as well as CAD with stenting and CVA with little residual weakness.  The patient has been on aspirin and Plavix, but ran out of his aspirin.  Was only taking Plavix.  Presented on July 11, 2015, with diplopia and slurred speech.  He lives with his spouse independent with a single-point cane prior to admission. Cranial CT scan with chronic ischemic white matter disease.  Old lacunar infarct without acute abnormality.  Echocardiogram with ejection fraction [id number] grade I diastolic dysfunction.  Carotid Dopplers with no ICA stenosis.  MRI of the brain, not completed due to a history of penile implant.  Neurology consulted.  Remains on aspirin and Plavix. Subcutaneous heparin for DVT prophylaxis.  Physical and occupational therapy ongoing.  The patient was admitted for a comprehensive rehab program.  PAST MEDICAL HISTORY:  See discharge diagnoses.  SOCIAL HISTORY:  Lives with spouse independent with a cane prior to admission.  Functional status upon admission to rehab services was min to mod assist, ambulate to 120 feet rolling walker, min assist sit to stand, min to mod activities of daily  living.  PHYSICAL EXAMINATION:  VITAL SIGNS:  Blood pressure 185/62, pulse 64, temperature 98, and respirations 16. GENERAL:  This was an alert male, in no acute distress.  Had some difficulty tracking to the left.  He struggles tracking to midline. Fair awareness of deficits.  Speech is a bit dysarthric but intelligible. LUNGS:  Clear to auscultation. CARDIAC:  Regular rate and rhythm. ABDOMEN:  Soft, nontender.  Good bowel sounds.  REHABILITATION HOSPITAL COURSE:  The patient was admitted to inpatient rehab services with therapies initiated on a 3-hour daily basis, consisting of physical therapy, occupational therapy, and rehabilitation nursing.  The following issues were addressed during the patient's rehabilitation stay.  Pertaining to Mr. Vivenzio pontine infarct remained stable.  He remained on aspirin and Plavix therapy.  Would follow up Neurology Services.  Subcutaneous heparin for DVT prophylaxis. No bleeding episodes.  He remained on hemodialysis as per Renal Services arranged as outpatient.  Blood pressure is well-controlled.  His lisinopril had been resumed.  Would follow up with his primary MD.  He continue on low-dose Lopressor.  Continued on Keppra for seizure disorder.  No seizure activity noted.  Diabetes mellitus, peripheral neuropathy.  Hemoglobin A1c of 7.7, NPH insulin as advised.  The patient received weekly collaborative interdisciplinary team conferences to discuss estimated length of stay, family teaching, and any barriers to his discharge.  Ambulates to  the gym with supervision, minimal cues to remain inside his walker.  Educated the patient and wife on performance at home as well as discussing benefits of lower extremity strengthening and balance.  Stair climbing 1 set of stairs bilateral hand rails.  No loss of balance.  He could gather his belongings for activities of daily living and homemaking, dressing, grooming, and bathing, full family teaching was  completed and plan discharge to home.  DISCHARGE MEDICATIONS:  Included; 1. Zyloprim 100 mg p.o. daily. 2. Elavil 25 mg p.o. at bedtime. 3. Aspirin 81 mg p.o. daily. 4. Sensipar 60 mg p.o. b.i.d. 5. Plavix 75 mg p.o. daily. 6. Colchicine 0.3 mg once per day on Monday and Friday. 7. NPH insulin 7 units b.i.d. 8. Keppra 500 mg Monday, Wednesday, and Friday, 1000 mg Keppra daily. 9. Lisinopril 20 mg p.o. daily. 10.Lopressor 12.5 mg p.o. b.i.d. 11.Multivitamin daily. 12.Renvela 800 mg p.o. t.i.d. 13.Zocor 20 mg p.o. daily.  DIET:  His diet was a renal diabetic diet.  FOLLOWUP:  The patient would follow up Dr. Alysia Penna at the outpatient rehab center as directed; Dr. Erlinda Hong, call for appointment; Dr. Gardiner Barefoot on August 02, 2015; Dr. Metta Clines, on August 09, 2015. Ongoing therapies had been arranged.     Lauraine Rinne, P.A.     DA/MEDQ  D:  07/20/2015  T:  07/20/2015  Job:  [id number]  cc:   Gardiner Barefoot, DPM

## 2015-07-20 NOTE — Discharge Instructions (Signed)
Inpatient Rehab Discharge Instructions  [facility] Discharge date and time: No discharge date for patient encounter.   Activities/Precautions/ Functional Status: Activity: activity as tolerated Diet: diabetic diet Wound Care: none needed Functional status:  ___ No restrictions     ___ Walk up steps independently ___ 24/7 supervision/assistance   ___ Walk up steps with assistance ___ Intermittent supervision/assistance  ___ Bathe/dress independently ___ Walk with walker     ___ Bathe/dress with assistance ___ Walk Independently    ___ Shower independently _x STROKE/TIA DISCHARGE INSTRUCTIONS SMOKING Cigarette smoking nearly doubles your risk of having a stroke & is the single most alterable risk factor  If you smoke or have smoked in the last 12 months, you are advised to quit smoking for your health.  Most of the excess cardiovascular risk related to smoking disappears within a year of stopping.  Ask you doctor about anti-smoking medications  [place] Quit Line: 1-800-QUIT NOW  Free Smoking Cessation Classes [phone]  CHOLESTEROL Know your levels; limit fat & cholesterol in your diet  Lipid Panel     Component Value Date/Time   CHOL 96 07/12/2015 0535   TRIG 79 07/12/2015 0535   HDL 44 07/12/2015 0535   CHOLHDL 2.2 07/12/2015 0535   VLDL 16 07/12/2015 0535   LDLCALC 36 07/12/2015 0535      Many patients benefit from treatment even if their cholesterol is at goal.  Goal: Total Cholesterol (CHOL) less than 160  Goal:  Triglycerides (TRIG) less than 150  Goal:  HDL greater than 40  Goal:  LDL (LDLCALC) less than 100   BLOOD PRESSURE American Stroke Association blood pressure target is less that 120/80 mm/Hg  Your discharge blood pressure is:  BP: (!) 185/80 mmHg  Monitor your blood pressure  Limit your salt and alcohol intake  Many individuals will require more than one medication for high blood pressure  DIABETES (A1c is a blood sugar average for last 3  months) Goal HGBA1c is under 7% (HBGA1c is blood sugar average for last 3 months)  Diabetes:    Lab Results  Component Value Date   HGBA1C 7.7* 07/12/2015     Your HGBA1c can be lowered with medications, healthy diet, and exercise.  Check your blood sugar as directed by your physician  Call your physician if you experience unexplained or low blood sugars.  PHYSICAL ACTIVITY/REHABILITATION Goal is 30 minutes at least 4 days per week  Activity: Increase activity slowly, Therapies: Physical Therapy: Home Health Return to work:   Activity decreases your risk of heart attack and stroke and makes your heart stronger.  It helps control your weight and blood pressure; helps you relax and can improve your mood.  Participate in a regular exercise program.  Talk with your doctor about the best form of exercise for you (dancing, walking, swimming, cycling).  DIET/WEIGHT Goal is to maintain a healthy weight  Your discharge diet is: Diet renal/carb modified with fluid restriction Diet-HS Snack?: Nothing; Room service appropriate?: Yes; Fluid consistency:: Thin  liquids Your height is:  Height: (!) 5.9" (15 cm) Your current weight is: Weight: 100.9 kg (222 lb 7.1 oz) Your Body Mass Index (BMI) is:  BMI (Calculated): 4381.9  Following the type of diet specifically designed for you will help prevent another stroke.  Your goal weight range is:    Your goal Body Mass Index (BMI) is 19-24.  Healthy food habits can help reduce 3 risk factors for stroke:  High cholesterol, hypertension, and excess  weight.  RESOURCES Stroke/Support Group:  Call [phone]   STROKE EDUCATION PROVIDED/REVIEWED AND GIVEN TO PATIENT Stroke warning signs and symptoms How to activate emergency medical system (call 911). Medications prescribed at discharge. Need for follow-up after discharge. Personal risk factors for stroke. Pneumonia vaccine given:  Flu vaccine given:  My questions have been answered, the writing  is legible, and I understand these instructions.  I will adhere to these goals & educational materials that have been provided to me after my discharge from the hospital.    __ Walk with assistance    ___ Shower with assistance ___ No alcohol     ___ Return to work/school ________  Special Instructions:  Continue dialysis as directed   [place]:    Home Health:   PT, OT, RN  [place]   Date of last service:07/21/2015   Medical Equipment/Items Ordered:HAS ALL NEEDED EQUIPMENT FROM PREVIOUS ADMITS  Other:[facility] COMMUNITY CASE MANAGEMENT TO [place] RESOURCES FOR PATIENT/FAMILY: Support Groups:[facility] SUPPORT GROUP   My questions have been answered and I understand these instructions. I will adhere to these goals and the provided educational materials after my discharge from the hospital.  Patient/Caregiver Signature _______________________________ Date __________  Clinician Signature _______________________________________ Date __________  Please bring this form and your medication list with you to all your follow-up doctor's appointments.

## 2015-07-20 NOTE — Progress Notes (Signed)
Speech Language Pathology Discharge Summary  Patient Details  Name: Ernest [name] MRN: [id number] Date of Birth: February 12, 1946  Today's Date: 07/20/2015 SLP Individual Time: 1400-1425 SLP Individual Time Calculation (min): 25 min   Skilled Therapeutic Interventions:   Skilled treatment session focused on addressing cognitive-linguistic goals. Patient was Mod I for recall as well as use of slow, loud speech to increase intelligibility to 90% Mod I and use of description duirng moments of anomia in conversation.  Patient abel to recall new information with repetition and wife will be writing notes, appointments as needed upon discharge.  SLP facilitated session by providing increased time for use of speech and word finding strategies during structured and unstructured conversational tasks.  Patient ready for discharge.     Patient has met 3 of 3 long term goals.  Patient to discharge at overall Modified Independent;Supervision level.  Reasons goals not met: n/a   Clinical Impression/Discharge Summary:    Patient has made functional gains during this rehab admission and has met 3 of 3 long term goals due to improved functional abilities.  Patient is currently overall Mod I for speech intelligibility and word finding and requires Supervision for basic recall due to inability to utilize external aids as patient was unable to read and write at baseline. Patient and family education has been completed and patient will discharge home with 24 hour supervision from spouse. As a result, no further skilled SLP services are warranted at this time.    Care Partner:  Caregiver Able to Provide Assistance: Yes  Type of Caregiver Assistance: Cognitive  Recommendation:  None;24 hour supervision/assistance     Equipment: none   Reasons for discharge: Treatment goals met;Discharged from hospital   Patient/Family Agrees with Progress Made and Goals Achieved: Yes   Function:  Cognition Comprehension  Comprehension assist level: Follows basic conversation/direction with extra time/assistive device  Expression   Expression assist level: Expresses basic needs/ideas: With extra time/assistive device  Social Interaction Social Interaction assist level: Interacts appropriately with others with medication or extra time (anti-anxiety, antidepressant).  Problem Solving Problem solving assist level: Solves basic 90% of the time/requires cueing < 10% of the time  Memory Memory assist level: Recognizes or recalls 90% of the time/requires cueing < 10% of the time   Carmelia Roller., CCC-SLP [phone]  [name] 07/20/2015, 2:28 PM

## 2015-07-20 NOTE — Progress Notes (Signed)
Occupational Therapy Session Note  Patient Details  Name: Ernest [name] MRN: [id number] Date of Birth: [birth year]  Today's Date: 07/20/2015 OT Individual Time: 1000-1100 and 1130-1200 OT Individual Time Calculation (min): 60 min and 30 min   Short Term Goals: Week 1:  OT Short Term Goal 1 (Week 1): STG = LTGs due to ELOS  Skilled Therapeutic Interventions/Progress Updates:    1) Completed ADL retraining at overall Modified independent level.  Pt completed bathing in room shower and dressing from EOB without need for cues.  Completed tub/shower transfers in ADL apt with shower seat and grab bars without assist.  9 hole peg test completed Rt: 2:04 and Lt: 2:16, discussed impact of vision on task.  Pt continues to have overshooting with reaching for items and finger to nose.  Discussed d/c recommendations with scanning environment to compensate for visual impairments with mobility.  2) Treatment session with focus on trunk control and dynamic standing balance.  Five time sit > stand completed see below for details, discussed fall risk.  Engaged in reaching activity with crossing midline and reaching outside BOS progressing from seated position without BLE support (on elevated mat) to standing incorporating mini-squat with reaching.  Pt required rest breaks between each activity.   Five times Sit to Stand Test (FTSS) Method: Use a straight back chair with a solid seat that is 16-18" high. Ask participant to sit on the chair with arms folded across their chest.   Instructions: "Stand up and sit down as quickly as possible 5 times, keeping your arms folded across your chest."   Measurement: Stop timing when the participant stands the 5th time.  TIME: __19.51____ (in seconds)  Times > 13.6 seconds is associated with increased disability and morbidity (Guralnik, 2000) Times > 15 seconds is predictive of recurrent falls in healthy individuals aged 71 and older (Buatois, et al., 2008) Normal  performance values in community dwelling individuals aged 28 and older (Bohannon, 2006): o 60-69 years: 11.4 seconds o 70-79 years: 12.6 seconds o 80-89 years: 14.8 seconds  MCID: ? 2.3 seconds for Vestibular Disorders Mariah Milling, 2006)   Therapy Documentation Precautions:  Precautions Precautions: Fall Precaution Comments: vision deficits Restrictions Weight Bearing Restrictions: No Pain: Pain Assessment Pain Assessment: No/denies pain Pain Score: 0-No pain  See Function Navigator for Current Functional Status.   Therapy/Group: Individual Therapy  [name] 07/20/2015, 12:06 PM

## 2015-07-20 NOTE — Progress Notes (Signed)
Ernest [name] PHYSICAL MEDICINE & REHABILITATION     PROGRESS NOTE    Subjective/Complaints: Patient without new issues overnight. He is tired from therapy this morning Reviewed nephrology note, appreciate help  ROS: Pt denies fever, rash/itching, headache,  nausea, vomiting, abdominal pain, diarrhea, chest pain, shortness of breath,   Objective: Vital Signs: Blood pressure 185/80, pulse 71, temperature 98.2 F (36.8 C), temperature source Oral, resp. rate 19, height 5.9" (0.15 m), weight 100.9 kg (222 lb 7.1 oz), SpO2 96 %. No results found. No results for input(s): WBC, HGB, HCT, PLT in the last 72 hours. No results for input(s): NA, K, CL, GLUCOSE, BUN, CREATININE, CALCIUM in the last 72 hours.  Invalid input(s): CO CBG (last 3)   Recent Labs  07/19/15 2357 07/20/15 0407 07/20/15 0810  GLUCAP 223* 166* 96    Wt Readings from Last 3 Encounters:  07/20/15 100.9 kg (222 lb 7.1 oz)  07/13/15 95.8 kg (211 lb 3.2 oz)  07/06/15 102.059 kg (225 lb)    Physical Exam:  Constitutional: He appears well-developed and well-nourished.  HENT: oral mucosa pink and moist.  Head: Normocephalic and atraumatic.  Eyes: No scleral icterus.  EOMI Neck: Normal range of motion. Neck supple. No JVD present. No tracheal deviation present. No thyromegaly present.  Cardiovascular: Normal rate and regular rhythm. no murmurs or rubs Respiratory: Effort normal and breath sounds normal. No respiratory distress. No wheezes or rales GI: Soft. Bowel sounds are normal. He exhibits no distension.  Neurological: He is alert.  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  Speech is a bit dysarthric but intelligible. Awareness of deficits. Follows commands. MMT: 4+ to 5/5 bilateral UE delt,bicep,tricep, wrist, fingers LE: 4/5 HF, KE and ADF/APF bilaterally.   Skin: Skin is warm and dry.  Psychiatric: He has a normal mood and affect. His behavior is normal   Assessment/Plan: 1.  Functional deficits secondary to right pontine infarct which require 3+ hours per day of interdisciplinary therapy in a comprehensive inpatient rehab setting. Physiatrist is providing close team supervision and 24 hour management of active medical problems listed below. Physiatrist and rehab team continue to assess barriers to discharge/monitor patient progress toward functional and medical goals. Team conference today please see physician documentation under team conference tab, met with team face-to-face to discuss problems,progress, and goals. Formulized individual treatment plan based on medical history, underlying problem and comorbidities. Function:  Bathing Bathing position   Position: Shower  Bathing parts Body parts bathed by patient: Right arm, Left arm, Chest, Abdomen, Front perineal area, Buttocks, Right upper leg, Left upper leg, Right lower leg, Left lower leg Body parts bathed by helper: Back  Bathing assist Assist Level: Supervision or verbal cues      Upper Body Dressing/Undressing Upper body dressing   What is the patient wearing?: Pull over shirt/dress     Pull over shirt/dress - Perfomed by patient: Thread/unthread right sleeve, Thread/unthread left sleeve, Put head through opening, Pull shirt over trunk          Upper body assist Assist Level: Set up   Set up : To obtain clothing/put away  Lower Body Dressing/Undressing Lower body dressing   What is the patient wearing?: Underwear, Pants, Socks, Shoes Underwear - Performed by patient: Thread/unthread right underwear leg, Thread/unthread left underwear leg, Pull underwear up/down   Pants- Performed by patient: Thread/unthread right pants leg, Thread/unthread left pants leg, Pull pants up/down       Socks - Performed by patient: Don/doff right sock,   Don/doff left sock   Shoes - Performed by patient: Don/doff right shoe, Don/doff left shoe            Lower body assist Assist for lower body dressing:  Supervision or verbal cues      Toileting Toileting   Toileting steps completed by patient: Adjust clothing prior to toileting, Performs perineal hygiene, Adjust clothing after toileting   Toileting Assistive Devices: Grab bar or rail  Toileting assist Assist level: Supervision or verbal cues   Transfers Chair/bed transfer   Chair/bed transfer method: Ambulatory Chair/bed transfer assist level: Supervision or verbal cues Chair/bed transfer assistive device: Walker, Air cabin crew     Max distance: 175 Assist level: Supervision or verbal cues   Wheelchair   Type: Manual Max wheelchair distance: 150 Assist Level: Supervision or verbal cues  Cognition Comprehension Comprehension assist level: Follows basic conversation/direction with extra time/assistive device  Expression Expression assist level: Expresses basic 90% of the time/requires cueing < 10% of the time.  Social Interaction Social Interaction assist level: Interacts appropriately 90% of the time - Needs monitoring or encouragement for participation or interaction.  Problem Solving Problem solving assist level: Solves basic 75 - 89% of the time/requires cueing 10 - 24% of the time  Memory Memory assist level: Recognizes or recalls 90% of the time/requires cueing < 10% of the time   Medical Problem List and Plan: 1. Functional deficits secondary to pontine infarct with diplopia, speech, and balance deficits  -Cont CIR  -follow up with neuro regarding potential location of infarct, ?INO 2. DVT Prophylaxis/Anticoagulation: Subcutaneous heparin. Monitor platelet counts and any signs of bleeding 3. Pain Management: Tylenol as needed 4. End-stage renal disease. Continue hemodialysis as per renal services. HD will be after therapies to avoid conflict with therapy day 5. Neuropsych: This patient is capable of making decisions on his own behalf. 6. Skin/Wound Care: Routine skin checks. Encourage PO  7.  Fluids/Electrolytes/Nutrition: Routine I&O's, as per nephrology 8. Seizure disorder. Keppra 1000 mg daily. Takes extra 500 mg tablet Monday Wednesday Friday after dialysis.   -no current seizure activity 9. Diabetes mellitus of peripheral neuropathy. Hemoglobin A1c 7.7.   -follow for pattern  -Improved, will cont to monitor, fasting CBG in 141 this AM,  Insulin NPH 7 units twice a day.  will cont to monitor  10. History of gout. Continue Zyloprim daily, colchicine 0.6 mg daily. Follow for gout flareups 11. Hyperlipidemia. Zocor 12. HTN  restarted lisinopril , am BP elevated ok during the day and in HD, Per nephrology, fluid balance is good, was on metoprolol at home, we will restart at a low dose 12.5 twice a day and adjust as needed    LOS (Days) 7 A FACE TO FACE EVALUATION WAS PERFORMED  Alysia Penna E 07/20/2015 10:01 AM

## 2015-07-21 ENCOUNTER — Inpatient Hospital Stay (HOSPITAL_COMMUNITY): Payer: Medicare Other | Admitting: Occupational Therapy

## 2015-07-21 ENCOUNTER — Inpatient Hospital Stay (HOSPITAL_COMMUNITY): Payer: Medicare Other | Admitting: Physical Therapy

## 2015-07-21 LAB — GLUCOSE, CAPILLARY: Glucose-Capillary: 118 mg/dL — ABNORMAL HIGH (ref 65–99)

## 2015-07-21 MED ORDER — ALLOPURINOL 100 MG PO TABS: 100.0000 mg | ORAL_TABLET | Freq: Every day | ORAL | Status: DC

## 2015-07-21 MED ORDER — COLCHICINE 0.6 MG PO TABS: ORAL_TABLET | ORAL | Status: DC

## 2015-07-21 MED ORDER — CLOPIDOGREL BISULFATE 75 MG PO TABS: 75.0000 mg | ORAL_TABLET | Freq: Every day | ORAL | Status: DC

## 2015-07-21 MED ORDER — METOPROLOL TARTRATE 25 MG PO TABS: 12.5000 mg | ORAL_TABLET | Freq: Two times a day (BID) | ORAL | Status: DC

## 2015-07-21 MED ORDER — SEVELAMER CARBONATE 800 MG PO TABS: 800.0000 mg | ORAL_TABLET | Freq: Three times a day (TID) | ORAL | Status: DC

## 2015-07-21 MED ORDER — CINACALCET HCL 60 MG PO TABS: 60.0000 mg | ORAL_TABLET | Freq: Two times a day (BID) | ORAL | Status: DC

## 2015-07-21 MED ORDER — SIMVASTATIN 20 MG PO TABS: 20.0000 mg | ORAL_TABLET | Freq: Every day | ORAL | Status: DC

## 2015-07-21 MED ORDER — LISINOPRIL 20 MG PO TABS: 20.0000 mg | ORAL_TABLET | Freq: Every day | ORAL | Status: DC

## 2015-07-21 MED ORDER — LEVETIRACETAM 500 MG PO TABS: 1000.0000 mg | ORAL_TABLET | Freq: Every day | ORAL | Status: DC

## 2015-07-21 MED ORDER — AMITRIPTYLINE HCL 25 MG PO TABS: ORAL_TABLET | ORAL | Status: DC

## 2015-07-21 MED ORDER — INSULIN NPH (HUMAN) (ISOPHANE) 100 UNIT/ML ~~LOC~~ SUSP: 7.0000 [IU] | Freq: Two times a day (BID) | SUBCUTANEOUS | Status: DC

## 2015-07-21 NOTE — Progress Notes (Signed)
Patient given discharge information from [name], PA- understanding verbalized. Patient assisted to car via wheelchair with the nurse tech assistance. All belongings packed and taken with patient.Ernest [name]

## 2015-07-21 NOTE — Progress Notes (Signed)
Social Work  Discharge Note  The overall goal for the admission was met for:   Discharge location: Yes-HOME WITH WIFE WHO CAN PROVIDE 24 HR SUPERVISION LEVEL  Length of Stay: Yes-8 DAYS  Discharge activity level: Yes-SUPERVISION/MOD/I LEVEL  Home/community participation: Yes  Services provided included: MD, RD, PT, OT, SLP, RN, CM, TR, Pharmacy and SW  Financial Services: Medicare and Private Insurance: [place]  Follow-up services arranged: Home Health: [facility]-PT,OT,RN and Patient/Family has no preference for HH/DME agencies  Comments (or additional information):PT REACHED SUPERVISION LEVEL GOALS AND WIFE WAS HERE TO OBSERVE BUT DID NOT PARTICIPATE IN THERAPIES. [facility] REFERRAL MADE AND THEY WILL FOLLOW IN THE COMMUNITY. HAS ALL NEEDED EQUIPMENT  Patient/Family verbalized understanding of follow-up arrangements: Yes  Individual responsible for coordination of the follow-up plan: SELF & Ernest [name]  Confirmed correct DME delivered: Ernest [name] 07/21/2015    Ernest [name]

## 2015-07-22 DIAGNOSIS — E119 Type 2 diabetes mellitus without complications: Secondary | ICD-10-CM | POA: Diagnosis not present

## 2015-07-22 DIAGNOSIS — H538 Other visual disturbances: Secondary | ICD-10-CM | POA: Diagnosis not present

## 2015-07-22 DIAGNOSIS — N2581 Secondary hyperparathyroidism of renal origin: Secondary | ICD-10-CM | POA: Diagnosis not present

## 2015-07-22 DIAGNOSIS — Z955 Presence of coronary angioplasty implant and graft: Secondary | ICD-10-CM | POA: Diagnosis not present

## 2015-07-22 DIAGNOSIS — Z7982 Long term (current) use of aspirin: Secondary | ICD-10-CM | POA: Diagnosis not present

## 2015-07-22 DIAGNOSIS — I69398 Other sequelae of cerebral infarction: Secondary | ICD-10-CM | POA: Diagnosis not present

## 2015-07-22 DIAGNOSIS — J449 Chronic obstructive pulmonary disease, unspecified: Secondary | ICD-10-CM | POA: Diagnosis not present

## 2015-07-22 DIAGNOSIS — D509 Iron deficiency anemia, unspecified: Secondary | ICD-10-CM | POA: Diagnosis not present

## 2015-07-22 DIAGNOSIS — M6281 Muscle weakness (generalized): Secondary | ICD-10-CM | POA: Diagnosis not present

## 2015-07-22 DIAGNOSIS — M109 Gout, unspecified: Secondary | ICD-10-CM | POA: Diagnosis not present

## 2015-07-22 DIAGNOSIS — E669 Obesity, unspecified: Secondary | ICD-10-CM | POA: Diagnosis not present

## 2015-07-22 DIAGNOSIS — Z992 Dependence on renal dialysis: Secondary | ICD-10-CM | POA: Diagnosis not present

## 2015-07-22 DIAGNOSIS — E11319 Type 2 diabetes mellitus with unspecified diabetic retinopathy without macular edema: Secondary | ICD-10-CM | POA: Diagnosis not present

## 2015-07-22 DIAGNOSIS — I5042 Chronic combined systolic (congestive) and diastolic (congestive) heart failure: Secondary | ICD-10-CM | POA: Diagnosis not present

## 2015-07-22 DIAGNOSIS — N186 End stage renal disease: Secondary | ICD-10-CM | POA: Diagnosis not present

## 2015-07-22 DIAGNOSIS — E785 Hyperlipidemia, unspecified: Secondary | ICD-10-CM | POA: Diagnosis not present

## 2015-07-22 DIAGNOSIS — Z8619 Personal history of other infectious and parasitic diseases: Secondary | ICD-10-CM | POA: Diagnosis not present

## 2015-07-22 DIAGNOSIS — I132 Hypertensive heart and chronic kidney disease with heart failure and with stage 5 chronic kidney disease, or end stage renal disease: Secondary | ICD-10-CM | POA: Diagnosis not present

## 2015-07-22 DIAGNOSIS — E1122 Type 2 diabetes mellitus with diabetic chronic kidney disease: Secondary | ICD-10-CM | POA: Diagnosis not present

## 2015-07-22 DIAGNOSIS — Z87891 Personal history of nicotine dependence: Secondary | ICD-10-CM | POA: Diagnosis not present

## 2015-07-22 DIAGNOSIS — E1140 Type 2 diabetes mellitus with diabetic neuropathy, unspecified: Secondary | ICD-10-CM | POA: Diagnosis not present

## 2015-07-22 DIAGNOSIS — Z794 Long term (current) use of insulin: Secondary | ICD-10-CM | POA: Diagnosis not present

## 2015-07-22 DIAGNOSIS — I2510 Atherosclerotic heart disease of native coronary artery without angina pectoris: Secondary | ICD-10-CM | POA: Diagnosis not present

## 2015-07-22 DIAGNOSIS — D631 Anemia in chronic kidney disease: Secondary | ICD-10-CM | POA: Diagnosis not present

## 2015-07-22 DIAGNOSIS — G40909 Epilepsy, unspecified, not intractable, without status epilepticus: Secondary | ICD-10-CM | POA: Diagnosis not present

## 2015-07-22 NOTE — Patient Outreach (Signed)
[place] [facility]) Care Management  07/22/2015  JAHKEEM PAYNTER [birth year] [id number]   Referral from Natividad Brood, RN to assign [employer] and SW, assigned Deloria Lair, NP (for [employer], RN) and [employer], [place].  Thanks, Ronnell Freshwater. [place], [place] Assistant Phone: [phone] Fax: [phone]

## 2015-07-25 DIAGNOSIS — I69398 Other sequelae of cerebral infarction: Secondary | ICD-10-CM | POA: Diagnosis not present

## 2015-07-25 DIAGNOSIS — D509 Iron deficiency anemia, unspecified: Secondary | ICD-10-CM | POA: Diagnosis not present

## 2015-07-25 DIAGNOSIS — M6281 Muscle weakness (generalized): Secondary | ICD-10-CM | POA: Diagnosis not present

## 2015-07-25 DIAGNOSIS — E119 Type 2 diabetes mellitus without complications: Secondary | ICD-10-CM | POA: Diagnosis not present

## 2015-07-25 DIAGNOSIS — H538 Other visual disturbances: Secondary | ICD-10-CM | POA: Diagnosis not present

## 2015-07-25 DIAGNOSIS — N186 End stage renal disease: Secondary | ICD-10-CM | POA: Diagnosis not present

## 2015-07-25 DIAGNOSIS — E1140 Type 2 diabetes mellitus with diabetic neuropathy, unspecified: Secondary | ICD-10-CM | POA: Diagnosis not present

## 2015-07-25 DIAGNOSIS — N2581 Secondary hyperparathyroidism of renal origin: Secondary | ICD-10-CM | POA: Diagnosis not present

## 2015-07-25 DIAGNOSIS — E1122 Type 2 diabetes mellitus with diabetic chronic kidney disease: Secondary | ICD-10-CM | POA: Diagnosis not present

## 2015-07-25 DIAGNOSIS — D631 Anemia in chronic kidney disease: Secondary | ICD-10-CM | POA: Diagnosis not present

## 2015-07-25 DIAGNOSIS — I132 Hypertensive heart and chronic kidney disease with heart failure and with stage 5 chronic kidney disease, or end stage renal disease: Secondary | ICD-10-CM | POA: Diagnosis not present

## 2015-07-26 DIAGNOSIS — I69398 Other sequelae of cerebral infarction: Secondary | ICD-10-CM | POA: Diagnosis not present

## 2015-07-26 DIAGNOSIS — M6281 Muscle weakness (generalized): Secondary | ICD-10-CM | POA: Diagnosis not present

## 2015-07-26 DIAGNOSIS — E1140 Type 2 diabetes mellitus with diabetic neuropathy, unspecified: Secondary | ICD-10-CM | POA: Diagnosis not present

## 2015-07-26 DIAGNOSIS — I132 Hypertensive heart and chronic kidney disease with heart failure and with stage 5 chronic kidney disease, or end stage renal disease: Secondary | ICD-10-CM | POA: Diagnosis not present

## 2015-07-26 DIAGNOSIS — E1122 Type 2 diabetes mellitus with diabetic chronic kidney disease: Secondary | ICD-10-CM | POA: Diagnosis not present

## 2015-07-26 DIAGNOSIS — H538 Other visual disturbances: Secondary | ICD-10-CM | POA: Diagnosis not present

## 2015-07-27 ENCOUNTER — Other Ambulatory Visit: Payer: Self-pay | Admitting: *Deleted

## 2015-07-27 ENCOUNTER — Encounter: Payer: Self-pay | Admitting: *Deleted

## 2015-07-27 DIAGNOSIS — N186 End stage renal disease: Secondary | ICD-10-CM | POA: Diagnosis not present

## 2015-07-27 DIAGNOSIS — D631 Anemia in chronic kidney disease: Secondary | ICD-10-CM | POA: Diagnosis not present

## 2015-07-27 DIAGNOSIS — D509 Iron deficiency anemia, unspecified: Secondary | ICD-10-CM | POA: Diagnosis not present

## 2015-07-27 DIAGNOSIS — N2581 Secondary hyperparathyroidism of renal origin: Secondary | ICD-10-CM | POA: Diagnosis not present

## 2015-07-27 DIAGNOSIS — E119 Type 2 diabetes mellitus without complications: Secondary | ICD-10-CM | POA: Diagnosis not present

## 2015-07-27 NOTE — Patient Outreach (Signed)
[facility]) Care Management  07/27/2015  GARWIN STADELMAN 20-Dec-1945 [id number]   Phone call to patient and patient's spouse.  Patient's spouse discussed needing resources to provide patient with personal care services and light housekeeping.  Per patient's spouse, both of them have medical conditions.  Natural support explored.  Per patient's spouse, they have no family and friends that could provide this type of assistance.  This Education officer, museum discussed private pay options for personal care services due to the fact that patient does not have the type of insurance that covers personal care.  Per patient and spouse, they cannot afford this, however are willing to apply for Medicaid.   Plan:  Home visit scheduled for 08/04/15 at 3:30pm to completed Medicaid application.    [name] [facility] Care Management [phone]

## 2015-07-27 NOTE — Patient Outreach (Signed)
I spoke with Ernest [name] today. Ernest [name] was gone to dialysis. Ernest [name] reports they are making the adaptations needed to care for Ernest [name] at home. She says they have home health coming out to provide nursing, PT and OT services. She says they still need someone to come out and cook and do housekeeping. I told her that most insurance policies do not provide for those services, however, he may qualify for CAP and that can be investigated, however, there is usually a waiting list.   Someone came to the house and she said she had to go. I asked if I could call in the morning and she said yes.  Deloria Lair [employer] [place] [phone]

## 2015-07-28 ENCOUNTER — Other Ambulatory Visit: Payer: Self-pay | Admitting: *Deleted

## 2015-07-28 DIAGNOSIS — E1140 Type 2 diabetes mellitus with diabetic neuropathy, unspecified: Secondary | ICD-10-CM | POA: Diagnosis not present

## 2015-07-28 DIAGNOSIS — I132 Hypertensive heart and chronic kidney disease with heart failure and with stage 5 chronic kidney disease, or end stage renal disease: Secondary | ICD-10-CM | POA: Diagnosis not present

## 2015-07-28 DIAGNOSIS — M6281 Muscle weakness (generalized): Secondary | ICD-10-CM | POA: Diagnosis not present

## 2015-07-28 DIAGNOSIS — I69398 Other sequelae of cerebral infarction: Secondary | ICD-10-CM | POA: Diagnosis not present

## 2015-07-28 DIAGNOSIS — E1122 Type 2 diabetes mellitus with diabetic chronic kidney disease: Secondary | ICD-10-CM | POA: Diagnosis not present

## 2015-07-28 DIAGNOSIS — H538 Other visual disturbances: Secondary | ICD-10-CM | POA: Diagnosis not present

## 2015-07-28 NOTE — Patient Outreach (Signed)
Pt needs primary care appt scheduled. I will do this tomorrow for him. Needs to be on Tuesday or Thursday.  I will call pt again next week.  Deloria Lair [facility] [place] [phone]

## 2015-07-28 NOTE — Patient Outreach (Addendum)
[facility]) Care Management  07/28/2015  Ernest [name] March 15, 1946 [id number]  Phone call to patient and patient's spouse to schedule initial home visit.  Per patient's spouse, they are in need of personal care services for patient.  This social worker discussed that patient's insurance  does not cover personal care services.  Private pay options discussed as well as applying for medicaid to cover the cost if approved.  Referral to the [facility] discussed as well for in home aid services that accept payments on a sliding scale.     Phone call to the [facility].  Referral made for personal care services for patient.  Spoke with [employer]  Patient placed on a waiting list.  Approximate wait time estimated a 6 to 8 months.   Plan: This social worker Ernest explore alternative plan for support during home visit on 08/04/15.            Medicaid application to be completed            Advanced Directive to be completed   [name] [facility] Care Management [phone]

## 2015-07-29 ENCOUNTER — Encounter: Payer: Self-pay | Admitting: *Deleted

## 2015-07-29 DIAGNOSIS — E119 Type 2 diabetes mellitus without complications: Secondary | ICD-10-CM | POA: Diagnosis not present

## 2015-07-29 DIAGNOSIS — D509 Iron deficiency anemia, unspecified: Secondary | ICD-10-CM | POA: Diagnosis not present

## 2015-07-29 DIAGNOSIS — D631 Anemia in chronic kidney disease: Secondary | ICD-10-CM | POA: Diagnosis not present

## 2015-07-29 DIAGNOSIS — N2581 Secondary hyperparathyroidism of renal origin: Secondary | ICD-10-CM | POA: Diagnosis not present

## 2015-07-29 DIAGNOSIS — N186 End stage renal disease: Secondary | ICD-10-CM | POA: Diagnosis not present

## 2015-07-29 NOTE — Patient Outreach (Signed)
[place] [facility]) Care Management  07/29/2015  GERADO COMPANION [birth year] [id number]   Called [facility] to request establishment appt for pt. He is high risk for readmission, just discharged from the hospital on 07/13/15. Multiple comorbidities. Sees someone in [place]. D. Goes to diaylsis 3 days a week. Appt needs to be on Tuesday or Thursday ASAP to prevent readmission. Left message on office manager voice mail with my number to call back.  I have advised pt that the request is in and I will notify when an appt is made.  Deloria Lair [facility] [place] [phone]

## 2015-08-01 DIAGNOSIS — N2581 Secondary hyperparathyroidism of renal origin: Secondary | ICD-10-CM | POA: Diagnosis not present

## 2015-08-01 DIAGNOSIS — E119 Type 2 diabetes mellitus without complications: Secondary | ICD-10-CM | POA: Diagnosis not present

## 2015-08-01 DIAGNOSIS — D631 Anemia in chronic kidney disease: Secondary | ICD-10-CM | POA: Diagnosis not present

## 2015-08-01 DIAGNOSIS — N186 End stage renal disease: Secondary | ICD-10-CM | POA: Diagnosis not present

## 2015-08-01 DIAGNOSIS — D509 Iron deficiency anemia, unspecified: Secondary | ICD-10-CM | POA: Diagnosis not present

## 2015-08-02 ENCOUNTER — Other Ambulatory Visit: Payer: Self-pay | Admitting: *Deleted

## 2015-08-02 ENCOUNTER — Ambulatory Visit (INDEPENDENT_AMBULATORY_CARE_PROVIDER_SITE_OTHER): Payer: Medicare Other | Admitting: Podiatry

## 2015-08-02 ENCOUNTER — Ambulatory Visit: Payer: Self-pay | Admitting: *Deleted

## 2015-08-02 DIAGNOSIS — I132 Hypertensive heart and chronic kidney disease with heart failure and with stage 5 chronic kidney disease, or end stage renal disease: Secondary | ICD-10-CM | POA: Diagnosis not present

## 2015-08-02 DIAGNOSIS — B351 Tinea unguium: Secondary | ICD-10-CM | POA: Diagnosis not present

## 2015-08-02 DIAGNOSIS — M79673 Pain in unspecified foot: Secondary | ICD-10-CM

## 2015-08-02 DIAGNOSIS — E1122 Type 2 diabetes mellitus with diabetic chronic kidney disease: Secondary | ICD-10-CM | POA: Diagnosis not present

## 2015-08-02 DIAGNOSIS — I69398 Other sequelae of cerebral infarction: Secondary | ICD-10-CM | POA: Diagnosis not present

## 2015-08-02 DIAGNOSIS — E1140 Type 2 diabetes mellitus with diabetic neuropathy, unspecified: Secondary | ICD-10-CM | POA: Diagnosis not present

## 2015-08-02 DIAGNOSIS — H538 Other visual disturbances: Secondary | ICD-10-CM | POA: Diagnosis not present

## 2015-08-02 DIAGNOSIS — M6281 Muscle weakness (generalized): Secondary | ICD-10-CM | POA: Diagnosis not present

## 2015-08-02 NOTE — Patient Outreach (Signed)
Talked with [name] briefly today  [facility]. He reports he is doing well. The therapist says she has graduated him from a walker to a cane. He has not had any falls. He states all of his health care needs are being met. I advised him I would call to check up on him next week.  I emphasized to [name] that he needs to go to the appt I set up for him on 08/04/15 at 3:15 pm.  I reminded the couple that my co-worker, [employer], [place], would be coming out on Thursday morning.  Deloria Lair [facility] [place] [phone]

## 2015-08-02 NOTE — Progress Notes (Signed)
Patient ID: Ernest [name], male   DOB: [birth year], 69 y.o.   MRN: [id number] Complaint:  Visit Type: Patient returns to my office for continued preventative foot care services. Complaint: Patient states" my nails have grown long and thick and become painful to walk and wear shoes" Patient has been diagnosed with DM with no foot  complications. He presents for preventative foot care services. No changes to ROS  Podiatric Exam: Vascular: dorsalis pedis and posterior tibial pulses are palpable bilateral. Capillary return is immediate. Temperature gradient is WNL. Skin turgor WNL  Sensorium: Normal Semmes Weinstein monofilament test. Normal tactile sensation bilaterally. Nail Exam: Pt has thick disfigured discolored nails with subungual debris noted bilateral entire nail hallux through fifth toenails Ulcer Exam: There is no evidence of ulcer or pre-ulcerative changes or infection. Orthopedic Exam: Muscle tone and strength are WNL. No limitations in general ROM. No crepitus or effusions noted. Foot type and digits show no abnormalities. Bony prominences are unremarkable. Skin: No Porokeratosis. No infection or ulcers  Diagnosis:  Tinea unguium, Pain in right toe, pain in left toes  Treatment & Plan Procedures and Treatment: Consent by patient was obtained for treatment procedures. The patient understood the discussion of treatment and procedures well. All questions were answered thoroughly reviewed. Debridement of mycotic and hypertrophic toenails, 1 through 5 bilateral and clearing of subungual debris. No ulceration, no infection noted.  Return Visit-Office Procedure: Patient instructed to return to the office for a follow up visit 3 months for continued evaluation and treatment.

## 2015-08-03 DIAGNOSIS — D509 Iron deficiency anemia, unspecified: Secondary | ICD-10-CM | POA: Diagnosis not present

## 2015-08-03 DIAGNOSIS — E119 Type 2 diabetes mellitus without complications: Secondary | ICD-10-CM | POA: Diagnosis not present

## 2015-08-03 DIAGNOSIS — N2581 Secondary hyperparathyroidism of renal origin: Secondary | ICD-10-CM | POA: Diagnosis not present

## 2015-08-03 DIAGNOSIS — D631 Anemia in chronic kidney disease: Secondary | ICD-10-CM | POA: Diagnosis not present

## 2015-08-03 DIAGNOSIS — N186 End stage renal disease: Secondary | ICD-10-CM | POA: Diagnosis not present

## 2015-08-04 ENCOUNTER — Ambulatory Visit (INDEPENDENT_AMBULATORY_CARE_PROVIDER_SITE_OTHER): Payer: Medicare Other | Admitting: Internal Medicine

## 2015-08-04 ENCOUNTER — Encounter: Payer: Self-pay | Admitting: Internal Medicine

## 2015-08-04 ENCOUNTER — Other Ambulatory Visit: Payer: Self-pay | Admitting: *Deleted

## 2015-08-04 ENCOUNTER — Ambulatory Visit: Payer: Self-pay | Admitting: *Deleted

## 2015-08-04 VITALS — BP 124/38 | HR 60 | Temp 98.3°F | Ht 69.0 in | Wt 220.0 lb

## 2015-08-04 DIAGNOSIS — I132 Hypertensive heart and chronic kidney disease with heart failure and with stage 5 chronic kidney disease, or end stage renal disease: Secondary | ICD-10-CM | POA: Diagnosis not present

## 2015-08-04 DIAGNOSIS — Z992 Dependence on renal dialysis: Secondary | ICD-10-CM

## 2015-08-04 DIAGNOSIS — N186 End stage renal disease: Secondary | ICD-10-CM

## 2015-08-04 DIAGNOSIS — I69391 Dysphagia following cerebral infarction: Secondary | ICD-10-CM | POA: Diagnosis not present

## 2015-08-04 DIAGNOSIS — M109 Gout, unspecified: Secondary | ICD-10-CM

## 2015-08-04 DIAGNOSIS — E785 Hyperlipidemia, unspecified: Secondary | ICD-10-CM

## 2015-08-04 DIAGNOSIS — I69322 Dysarthria following cerebral infarction: Secondary | ICD-10-CM | POA: Diagnosis not present

## 2015-08-04 DIAGNOSIS — I120 Hypertensive chronic kidney disease with stage 5 chronic kidney disease or end stage renal disease: Secondary | ICD-10-CM | POA: Diagnosis not present

## 2015-08-04 DIAGNOSIS — Z794 Long term (current) use of insulin: Secondary | ICD-10-CM

## 2015-08-04 DIAGNOSIS — I69393 Ataxia following cerebral infarction: Secondary | ICD-10-CM

## 2015-08-04 DIAGNOSIS — E1122 Type 2 diabetes mellitus with diabetic chronic kidney disease: Secondary | ICD-10-CM

## 2015-08-04 DIAGNOSIS — Z7982 Long term (current) use of aspirin: Secondary | ICD-10-CM | POA: Diagnosis not present

## 2015-08-04 DIAGNOSIS — E1169 Type 2 diabetes mellitus with other specified complication: Secondary | ICD-10-CM | POA: Diagnosis not present

## 2015-08-04 DIAGNOSIS — E1142 Type 2 diabetes mellitus with diabetic polyneuropathy: Secondary | ICD-10-CM | POA: Diagnosis not present

## 2015-08-04 DIAGNOSIS — M6281 Muscle weakness (generalized): Secondary | ICD-10-CM | POA: Diagnosis not present

## 2015-08-04 DIAGNOSIS — E784 Other hyperlipidemia: Secondary | ICD-10-CM

## 2015-08-04 DIAGNOSIS — R1310 Dysphagia, unspecified: Secondary | ICD-10-CM

## 2015-08-04 DIAGNOSIS — Z79899 Other long term (current) drug therapy: Secondary | ICD-10-CM

## 2015-08-04 DIAGNOSIS — I69398 Other sequelae of cerebral infarction: Secondary | ICD-10-CM

## 2015-08-04 DIAGNOSIS — I6930 Unspecified sequelae of cerebral infarction: Secondary | ICD-10-CM

## 2015-08-04 DIAGNOSIS — E1165 Type 2 diabetes mellitus with hyperglycemia: Secondary | ICD-10-CM | POA: Diagnosis present

## 2015-08-04 DIAGNOSIS — H499 Unspecified paralytic strabismus: Secondary | ICD-10-CM | POA: Diagnosis not present

## 2015-08-04 DIAGNOSIS — R569 Unspecified convulsions: Secondary | ICD-10-CM | POA: Diagnosis not present

## 2015-08-04 DIAGNOSIS — E1140 Type 2 diabetes mellitus with diabetic neuropathy, unspecified: Secondary | ICD-10-CM | POA: Diagnosis not present

## 2015-08-04 DIAGNOSIS — I10 Essential (primary) hypertension: Secondary | ICD-10-CM

## 2015-08-04 DIAGNOSIS — IMO0002 Reserved for concepts with insufficient information to code with codable children: Secondary | ICD-10-CM

## 2015-08-04 DIAGNOSIS — H538 Other visual disturbances: Secondary | ICD-10-CM | POA: Diagnosis not present

## 2015-08-04 LAB — GLUCOSE, CAPILLARY: GLUCOSE-CAPILLARY: 106 mg/dL — AB (ref 65–99)

## 2015-08-04 MED ORDER — ATORVASTATIN CALCIUM 40 MG PO TABS: 40.0000 mg | ORAL_TABLET | Freq: Every day | ORAL | Status: DC

## 2015-08-04 NOTE — Patient Instructions (Signed)
-   Stop taking Simvastatin. Switch to Atorvastatin 40 mg daily. - Follow up appointment in 2 weeks to reassess blood sugars. - Please check blood sugars three times daily and bring in glucose meter to your next visit.  General Instructions:   Please bring your medicines with you each time you come to clinic.  Medicines may include prescription medications, over-the-counter medications, herbal remedies, eye drops, vitamins, or other pills.   Progress Toward Treatment Goals:  No flowsheet data found.  Self Care Goals & Plans:  Self Care Goal 08/04/2015  Manage my medications take my medicines as prescribed; refill my medications on time  Monitor my health keep track of my blood glucose; bring my glucose meter and log to each visit; check my feet daily; keep track of my blood pressure  Eat healthy foods eat more vegetables; eat foods that are low in salt; eat baked foods instead of fried foods; eat smaller portions  Be physically active find an activity I enjoy    No flowsheet data found.   Care Management & Community Referrals:  No flowsheet data found.

## 2015-08-05 DIAGNOSIS — N186 End stage renal disease: Secondary | ICD-10-CM | POA: Diagnosis not present

## 2015-08-05 DIAGNOSIS — D631 Anemia in chronic kidney disease: Secondary | ICD-10-CM | POA: Diagnosis not present

## 2015-08-05 DIAGNOSIS — E119 Type 2 diabetes mellitus without complications: Secondary | ICD-10-CM | POA: Diagnosis not present

## 2015-08-05 DIAGNOSIS — N2581 Secondary hyperparathyroidism of renal origin: Secondary | ICD-10-CM | POA: Diagnosis not present

## 2015-08-05 DIAGNOSIS — D509 Iron deficiency anemia, unspecified: Secondary | ICD-10-CM | POA: Diagnosis not present

## 2015-08-05 NOTE — Patient Outreach (Addendum)
[facility]) Care Management  [facility] Social Work  08/05/2015  Ernest [name] 12-11-1945 [id number]  Subjective: "we need help cleaning the house"  Objective:   Current Medications:  Current Outpatient Prescriptions  Medication Sig Dispense Refill  . allopurinol (ZYLOPRIM) 100 MG tablet Take 1 tablet (100 mg total) by mouth daily. 30 tablet 1  . amitriptyline (ELAVIL) 25 MG tablet TAKE ONE TABLET BY MOUTH AT BEDTIME FOR  NEUROPATHY 90 tablet 0  . aspirin EC 81 MG tablet Take 81 mg by mouth daily.    [name] atorvastatin (LIPITOR) 40 MG tablet Take 1 tablet (40 mg total) by mouth daily. 30 tablet 3  . cinacalcet (SENSIPAR) 60 MG tablet Take 1 tablet (60 mg total) by mouth 2 (two) times daily. 60 tablet 0  . clopidogrel (PLAVIX) 75 MG tablet Take 1 tablet (75 mg total) by mouth daily. 30 tablet 1  . colchicine 0.6 MG tablet 1 per day on Monday and Friday 30 tablet 0  . insulin NPH Human (HUMULIN N,NOVOLIN N) 100 UNIT/ML injection Inject 0.07 mLs (7 Units total) into the skin 2 (two) times daily at 8 am and 10 pm. 10 mL 11  . levETIRAcetam (KEPPRA) 500 MG tablet Take 2 tablets (1,000 mg total) by mouth daily. Then take extra 500 mg (1 tablet) on MWF after HD 72 tablet 5  . lisinopril (PRINIVIL,ZESTRIL) 20 MG tablet Take 1 tablet (20 mg total) by mouth at bedtime. 30 tablet 1  . metoprolol tartrate (LOPRESSOR) 25 MG tablet Take 0.5 tablets (12.5 mg total) by mouth 2 (two) times daily. (Patient taking differently: Take 25 mg by mouth 2 (two) times daily. ) 60 tablet 1  . multivitamin (RENA-VIT) TABS tablet Take 1 tablet by mouth daily.     . sevelamer carbonate (RENVELA) 800 MG tablet Take 1 tablet (800 mg total) by mouth 3 (three) times daily with meals. 90 tablet 0   No current facility-administered medications for this visit.    Functional Status:  In your present state of health, do you have any difficulty performing the following activities: 08/04/2015 07/13/2015  Hearing?  Tempie Donning  Vision? Y N  Difficulty concentrating or making decisions? N N  Walking or climbing stairs? Y Y  Dressing or bathing? N Y  Doing errands, shopping? Tempie Donning  Preparing Food and eating ? Y -  Using the Toilet? N -  In the past six months, have you accidently leaked urine? N -  Do you have problems with loss of bowel control? N -  Managing your Medications? Y -  Managing your Finances? Y -  Housekeeping or managing your Housekeeping? Y -    Fall/Depression Screening:  PHQ 2/9 Scores 08/04/2015 08/04/2015 07/28/2013 07/06/2013  PHQ - 2 Score 1 1 0 0    Assessment:  Patient answered the door using his walker when this social worker arrived.  Patient and wife friendly and engaging.  Medicaid application completed as well Advanced Directive.  Advanced Directive  needing  to be notarized discussed as well.  Patient's spouse discussed need for someone to assist with general housekeeping, stating that she mainly helps patient with his ADL's and medication.  This social worker explained that Medicaid will not pay for housekeeping and that she would have to pay privately for that service.  Resources provided for general housekeeping.  Suggestion made to enlist assistance from her church, family and friends.  Patient scheduled to see primary care doctor today, spouse will provide transportation.  Plan:  Follow up in one month.  Patient will have advanced directive notarized as well has documents needed to complete medicaid application, list provided.             Note to be routed to patient's primary care doctor.     [facility] CM Care Plan Problem One        Most Recent Value   Care Plan Problem One  patient requesting  personal care aid   Role Documenting the Problem One  Clinical Social Worker   Care Plan for Problem One  Active   [facility] CM Short Term Goal #1 (0-30 days)  patient and spouse to complete medicaid application within 30 days   [facility] CM Short Term Goal #1 Start Date  08/04/15    Interventions for Short Term Goal #1  medicaid application completed, list  of supporting documents to go along with patient and spouse provided for patient and spouse to obtain     [name] [facility] Care Management [phone]

## 2015-08-07 DIAGNOSIS — I6930 Unspecified sequelae of cerebral infarction: Secondary | ICD-10-CM | POA: Insufficient documentation

## 2015-08-07 DIAGNOSIS — Z8673 Personal history of transient ischemic attack (TIA), and cerebral infarction without residual deficits: Secondary | ICD-10-CM | POA: Insufficient documentation

## 2015-08-07 DIAGNOSIS — I69398 Other sequelae of cerebral infarction: Secondary | ICD-10-CM | POA: Insufficient documentation

## 2015-08-07 DIAGNOSIS — E1142 Type 2 diabetes mellitus with diabetic polyneuropathy: Secondary | ICD-10-CM | POA: Insufficient documentation

## 2015-08-07 DIAGNOSIS — R569 Unspecified convulsions: Secondary | ICD-10-CM

## 2015-08-07 NOTE — Assessment & Plan Note (Signed)
Well controlled with Amitriptyline 25 mg QHS.

## 2015-08-07 NOTE — Assessment & Plan Note (Signed)
Stable. No missed sessions.

## 2015-08-07 NOTE — Progress Notes (Signed)
   Subjective:    Patient ID: Ernest [name], male    DOB: [birth year], 69 y.o.   MRN: [id number]  HPI Ernest [name] is a 69yo man with PMHx of HTN, type 2 DM, CAD, chronic combined HF, multiple CVAs, and ESRD who presents today to establish care.  CVAs: Patient has a history of multiple CVAs (four per patient). His last CVA was in September this year and was determined to be a right pontine infarct due to his left eye horizontal gaze and "one and half syndrome". Patient could not have an MRI brain since he has a penile implant and CTA head/neck could not be done due to difficult IV access. Etiology of his stroke was determined to be in the setting of small vessel disease and recent missing doses of aspirin. He was discharged to inpatient rehab and is now at home. He reports compliance with his aspirin and plavix. He notes residual dysarthria and dysphagia from prior strokes. He has left vision changes and ataxia from this most recent stroke.   HTN: BP controlled at 124/33. He takes Lisinopril 20 mg daily and Lopressor 25 mg BID.   Type 2 DM with Peripheral Neuropathy: Last HbA1c 7.7 on 9/27. Patient did not bring his glucometer today. He reports most blood sugars are in the 130s. He tests three times daily. He reports taking Humulin N 7 units BID. He takes amitriptyline for his peripheral neuropathy.   Gout: Reports his gout is well controlled with Allopurinol 100 mg daily and Colchicine 0.6 mg daily on Mon and Fri. Denies any recent acute flares.   Seizures: First seizure on 04/25/15 and was taken to ED. He follows with Dr. Tomi Likens (neurology) and determined his seizures are likely secondary to his remote strokes and possibly exacerbated by his elevated glucose at the time (>500). He was started on Keppra 500 mg BID. His Keppra was then increased to 1000 mg BID. He reports compliance with this medication.   ESRD: Patient is on MWF HD schedule. He started on HD back in 2009 when he lived in [place]. He has a  LUE AV graft. He denies any missed sessions of dialysis.    Review of Systems General: Denies fever, chills, night sweats, changes in weight, changes in appetite HEENT: Denies headaches, ear pain, rhinorrhea, sore throat CV: Denies CP, palpitations, SOB, orthopnea Pulm: Denies SOB, cough, wheezing GI: Denies abdominal pain, nausea, vomiting, diarrhea, constipation, melena, hematochezia GU: Denies dysuria, hematuria, frequency Msk: Denies muscle cramps, joint pains Neuro: Denies weakness Skin: Denies rashes, bruising Psych: Denies depression, anxiety, hallucinations    Objective:   Physical Exam General: alert, sitting up in chair, pleasant, NAD HEENT: [place]/AT, EOMI, left pupil nonreactive, mucus membranes moist CV: RRR, no m/g/r Pulm: CTA bilaterally, breaths non-labored Abd: BS+, soft, non-tender Ext: warm, no peripheral edema Neuro: alert and oriented x 3. Strength 5/5 in upper and lower extremities bilaterally. Dysarthric, but speech intelligible. Left pupil nonreactive. EOMI. Smile symmetric.      Assessment & Plan:  Please refer to A&P documentation.

## 2015-08-07 NOTE — Assessment & Plan Note (Signed)
Continue Keppra 1000 mg BID. Has appointment with Dr. Tomi Likens on 10/25.

## 2015-08-07 NOTE — Assessment & Plan Note (Addendum)
Last LDL 36 on 9/27. Patient should be on high intensity statin therapy given his history of mutiple CVAs and diabetes. - Switch from Simvastatin 20 mg daily to Atorvastatin 40 mg daily.

## 2015-08-07 NOTE — Assessment & Plan Note (Signed)
Patient with hx of multiple strokes and most recent pontine stroke in September 2016. MRI brain could not be done due to his penile implant. He has dysarthria and dysphagia deficits from prior strokes and left sided ophthalmoplegia and ataxia from this most recent stroke. Will need to optimize medical management given his stroke history.  - Continue ASA and Plavix - Switch from Simvastatin 20 mg to Atorvastatin 40 mg daily (high intensity) - Optimize DM control and HTN

## 2015-08-07 NOTE — Assessment & Plan Note (Signed)
BP Readings from Last 3 Encounters:  08/04/15 124/38  07/21/15 146/54  07/13/15 133/49    Lab Results  Component Value Date   NA 129* 07/20/2015   K 4.7 07/20/2015   CREATININE 10.33* 07/20/2015    Assessment: Blood pressure control: controlled Progress toward BP goal:  at goal Comments: Well controlled.   Plan: Medications:  Continue Lisinopril 20 mg daily and Lopressor 25 mg BID.

## 2015-08-07 NOTE — Assessment & Plan Note (Addendum)
Lab Results  Component Value Date   HGBA1C 7.7* 07/12/2015   HGBA1C 6.6* 06/10/2013     Assessment: Diabetes control: fair control Progress toward A1C goal:  deteriorated Comments: HbA1c increased in September. He is Humulin N currently. Unable to assess blood sugars since patient did not bring his glucometer with him. Instructed to continue testing three times daily and bring glucometer to next visit.   Plan: Medications:  Continue Humulin N 7 units BID. Consider switching to different regimen (Lantus) if blood sugars uncontrolled with N.  Home glucose monitoring: Frequency: 3 times a day Timing: before meals Instruction/counseling given: reminded to bring blood glucose meter & log to each visit and reminded to bring medications to each visit Other plans:  - Need to optimize blood sugar control because of hx of multiple strokes - Reports last eye exam was this year- will need to get records  - f/u in 1 month to assess blood sugars

## 2015-08-07 NOTE — Assessment & Plan Note (Signed)
Well controlled with Colchicine and Allopurinol.

## 2015-08-08 ENCOUNTER — Other Ambulatory Visit: Payer: Self-pay | Admitting: *Deleted

## 2015-08-08 DIAGNOSIS — N186 End stage renal disease: Secondary | ICD-10-CM | POA: Diagnosis not present

## 2015-08-08 DIAGNOSIS — E119 Type 2 diabetes mellitus without complications: Secondary | ICD-10-CM | POA: Diagnosis not present

## 2015-08-08 DIAGNOSIS — D631 Anemia in chronic kidney disease: Secondary | ICD-10-CM | POA: Diagnosis not present

## 2015-08-08 DIAGNOSIS — N2581 Secondary hyperparathyroidism of renal origin: Secondary | ICD-10-CM | POA: Diagnosis not present

## 2015-08-08 DIAGNOSIS — D509 Iron deficiency anemia, unspecified: Secondary | ICD-10-CM | POA: Diagnosis not present

## 2015-08-09 ENCOUNTER — Encounter: Payer: Self-pay | Admitting: Neurology

## 2015-08-09 ENCOUNTER — Other Ambulatory Visit: Payer: Self-pay | Admitting: *Deleted

## 2015-08-09 ENCOUNTER — Ambulatory Visit (INDEPENDENT_AMBULATORY_CARE_PROVIDER_SITE_OTHER): Payer: Medicare Other | Admitting: Neurology

## 2015-08-09 VITALS — BP 128/66 | HR 67 | Ht 69.0 in | Wt 220.0 lb

## 2015-08-09 DIAGNOSIS — I10 Essential (primary) hypertension: Secondary | ICD-10-CM

## 2015-08-09 DIAGNOSIS — E1165 Type 2 diabetes mellitus with hyperglycemia: Secondary | ICD-10-CM

## 2015-08-09 DIAGNOSIS — E1122 Type 2 diabetes mellitus with diabetic chronic kidney disease: Secondary | ICD-10-CM | POA: Diagnosis not present

## 2015-08-09 DIAGNOSIS — Z992 Dependence on renal dialysis: Secondary | ICD-10-CM | POA: Insufficient documentation

## 2015-08-09 DIAGNOSIS — I69398 Other sequelae of cerebral infarction: Secondary | ICD-10-CM

## 2015-08-09 DIAGNOSIS — I132 Hypertensive heart and chronic kidney disease with heart failure and with stage 5 chronic kidney disease, or end stage renal disease: Secondary | ICD-10-CM | POA: Diagnosis not present

## 2015-08-09 DIAGNOSIS — H538 Other visual disturbances: Secondary | ICD-10-CM | POA: Diagnosis not present

## 2015-08-09 DIAGNOSIS — I639 Cerebral infarction, unspecified: Secondary | ICD-10-CM

## 2015-08-09 DIAGNOSIS — E1140 Type 2 diabetes mellitus with diabetic neuropathy, unspecified: Secondary | ICD-10-CM | POA: Diagnosis not present

## 2015-08-09 DIAGNOSIS — E785 Hyperlipidemia, unspecified: Secondary | ICD-10-CM | POA: Diagnosis not present

## 2015-08-09 DIAGNOSIS — IMO0002 Reserved for concepts with insufficient information to code with codable children: Secondary | ICD-10-CM

## 2015-08-09 DIAGNOSIS — N186 End stage renal disease: Secondary | ICD-10-CM

## 2015-08-09 DIAGNOSIS — M6281 Muscle weakness (generalized): Secondary | ICD-10-CM | POA: Diagnosis not present

## 2015-08-09 DIAGNOSIS — R569 Unspecified convulsions: Secondary | ICD-10-CM

## 2015-08-09 DIAGNOSIS — I6350 Cerebral infarction due to unspecified occlusion or stenosis of unspecified cerebral artery: Secondary | ICD-10-CM | POA: Diagnosis not present

## 2015-08-09 NOTE — Progress Notes (Signed)
NEUROLOGY FOLLOW UP OFFICE NOTE  ODIES HAMMACK [id number]  HISTORY OF PRESENT ILLNESS: Ernest [name] is a 69 year old right-handed man with hyperlipidemia, ESRD on HD (M/W/F), type II diabetes mellitus, hypertension, COPD, gout, chronic CHF (EF 30-35%), coronary artery disease, hepatitis C and history of stroke who follows up for stroke and seizures.  History obtained from patient and hospital note.  CT head images and labs reviewed.  UPDATE: He was admitted to [facility] on 04/29/15 for seizure, which was thought to be secondary to hyperglycemia with a blood glucose level of 662.  MRI of the brain was withheld because he had a penile implant that contains metal.  Keppra was increased to 1000mg  daily with 500mg  following each dialysis.  He was admitted to [facility] again on 07/11/15 for presumed acute right pontine infarct.  He presented with diplopia and unsteady gait.  On exam, he was found to have one and a half syndrome.  CT of head and repeat CT of head did not reveal acute infarct.  2D echo showed EF 45-50% with grade 1 diastolic dysfunction.  Carotid doppler showed no hemodynamically significant ICA stenosis.  LDL was 36.  Hgb A1c was 7.7.  HIV antibody was nonreactive.  He was discharged on ASA 81mg  and Plavix daily.  HISTORY: Around October 2015, he developed slurred speech.  He also experienced ringing in his right ear, with sensation of water in his right ear.  He did not seek medical attention at the time.  There was no facial droop or difficulty walking or swallowing.  He decided to go to the ED on 09/21/14 for an evaluation as the slurred speech and tinnitus persisted.  He denied weakness but ED exam revealed right arm weakness.  CT of the head was performed, which revealed chronic small vessel ischemic changes, including remote right cerebellar, left pontine and right basal ganglia lacunar infarcts.  No acute infarction was seen.    On 04/25/15, he developed right hand cramping and  dystonia.  When EMS arrived, he had jerking of the right hand that then generalized to a tonic-clonic seizure.  He had two other seizures, in the ambulance and in the ED, lasting 30-40 seconds.  Blood sugar was 557. Bicarbonate was 25.  Cr clearance 9.86 mL/min.  CT of the head showed chronic small vessel disease and moderate brain atrophy.  He was started on Keppra 500mg  twice daily.    PAST MEDICAL HISTORY: Past Medical History  Diagnosis Date  . Hyperlipidemia   . Retinopathy   . Gout   . ESRD (end stage renal disease) ([place])     Started HD in [place] in 2009, ESRD was due to DM. Moved to [facility] in Dec 2009 and now gets dialysis at [facility] on a MWF schedule.     . Erectile dysfunction     penile implant  . Abnormal stress test     s/p cath November 2013 with modest disease involving the ostial left main, proximal LAD, proximal RCA - do not appear to be hemodynamically signficant; mild LV dysfunction  . Obesity   . Hypertension   . Hepatitis C   . COPD (chronic obstructive pulmonary disease) ([place])   . Coronary artery disease     per cath report 2013  . Ejection fraction < 50%   . Chronic systolic CHF (congestive heart failure) ([place])   . Bacteremia   . Shortness of breath     Hx: of with exertion  .  Gout     Hx: of  . Stroke ([place])   . Type II or unspecified type diabetes mellitus without mention of complication, not stated as uncontrolled     adult onset    MEDICATIONS: Current Outpatient Prescriptions on File Prior to Visit  Medication Sig Dispense Refill  . allopurinol (ZYLOPRIM) 100 MG tablet Take 1 tablet (100 mg total) by mouth daily. 30 tablet 1  . amitriptyline (ELAVIL) 25 MG tablet TAKE ONE TABLET BY MOUTH AT BEDTIME FOR  NEUROPATHY 90 tablet 0  . aspirin EC 81 MG tablet Take 81 mg by mouth daily.    [name] atorvastatin (LIPITOR) 40 MG tablet Take 1 tablet (40 mg total) by mouth daily. 30 tablet 3  . cinacalcet (SENSIPAR) 60 MG tablet Take 1 tablet (60 mg total) by mouth 2  (two) times daily. 60 tablet 0  . clopidogrel (PLAVIX) 75 MG tablet Take 1 tablet (75 mg total) by mouth daily. 30 tablet 1  . colchicine 0.6 MG tablet 1 per day on Monday and Friday 30 tablet 0  . insulin NPH Human (HUMULIN N,NOVOLIN N) 100 UNIT/ML injection Inject 0.07 mLs (7 Units total) into the skin 2 (two) times daily at 8 am and 10 pm. 10 mL 11  . levETIRAcetam (KEPPRA) 500 MG tablet Take 2 tablets (1,000 mg total) by mouth daily. Then take extra 500 mg (1 tablet) on MWF after HD 72 tablet 5  . lisinopril (PRINIVIL,ZESTRIL) 20 MG tablet Take 1 tablet (20 mg total) by mouth at bedtime. 30 tablet 1  . metoprolol tartrate (LOPRESSOR) 25 MG tablet Take 0.5 tablets (12.5 mg total) by mouth 2 (two) times daily. (Patient taking differently: Take 25 mg by mouth 2 (two) times daily. ) 60 tablet 1  . multivitamin (RENA-VIT) TABS tablet Take 1 tablet by mouth daily.     . sevelamer carbonate (RENVELA) 800 MG tablet Take 1 tablet (800 mg total) by mouth 3 (three) times daily with meals. 90 tablet 0   No current facility-administered medications on file prior to visit.    ALLERGIES: [name]  FAMILY HISTORY: Family History  Problem Relation Age of Onset  . Heart disease Father   . Diabetes Sister   . Diabetes Brother   . Diabetes Son     SOCIAL HISTORY: Social History   Social History  . Marital Status: Married    Spouse Name: N/A  . Number of Children: N/A  . Years of Education: N/A   Occupational History  . 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 Smoking status: Former Smoker -- 7 years    Quit date: 06/10/1973  . Smokeless tobacco: Never Used     Comment: Quit in 1974.  [name] Alcohol Use: No  . Drug Use: No  . Sexual Activity: Not on file   Other Topics Concern  . Not on file   Social History Narrative   Adopted mentally retarded child at home, 2 adopted children, 3 living and 2 deceased.    REVIEW OF SYSTEMS: Constitutional: No fevers,  chills, or sweats, no generalized fatigue, change in appetite Eyes: No visual changes, double vision, eye pain Ear, nose and throat: No hearing loss, ear pain, nasal congestion, sore throat Cardiovascular: No chest pain, palpitations Respiratory:  No shortness of breath at rest or with exertion, wheezes GastrointestinaI: No nausea, vomiting, diarrhea, abdominal pain, fecal incontinence Genitourinary:  No dysuria, urinary retention or frequency Musculoskeletal:  No neck  pain, back pain Integumentary: No rash, pruritus, skin lesions Neurological: as above Psychiatric: No depression, insomnia, anxiety Endocrine: No palpitations, fatigue, diaphoresis, mood swings, change in appetite, change in weight, increased thirst Hematologic/Lymphatic:  No anemia, purpura, petechiae. Allergic/Immunologic: no itchy/runny eyes, nasal congestion, recent allergic reactions, rashes  PHYSICAL EXAM: Filed Vitals:   08/09/15 0754  BP: 128/66  Pulse: 67   General: No acute distress.  Head:  Normocephalic/atraumatic Eyes:  Fundoscopic exam unremarkable without vessel changes, exudates, hemorrhages or papilledema. Neck: supple, no paraspinal tenderness, full range of motion Heart:  Regular rate and rhythm Lungs:  Clear to auscultation bilaterally Back: No paraspinal tenderness Neurological Exam: alert and oriented to person, place, and time. Attention span and concentration intact, recent and remote memory intact, fund of knowledge intact.  Speech fluent and not dysarthric, language intact.  Tracking is inconsistent with horizontal eye movements bilaterally.  Otherwise, CN II-XII intact. Fundoscopic exam unremarkable without vessel changes, exudates, hemorrhages or papilledema.  Bulk and tone normal, muscle strength 5/5 throughout.  Reduced vibration in feet.  Deep tendon reflexes 1+ throughout except absent in ankles, toes downgoing.  Finger to nose testing intact.  Gait mildly wide-based.  IMPRESSION: Probable  right pontine infarct Symptomatic localization related seizure with secondary generalization Uncontrolled type 2 diabetes Hyperlipidemia  PLAN: ASA and Plavix Simvastatin (LDL at goal of less than 70) Keppra 1000mg  daily with 500mg  following dialysis Diabetes and cholesterol control.  Metta Clines, DO  CC: Albin Felling, MD

## 2015-08-09 NOTE — Patient Instructions (Signed)
1.  Continue aspirin and Plavix 2.  Continue Keppra 1000mg  daily and 500mg  after dialysis 3.  Try to continue improving diabetes 4.  Follow up in 6 months.

## 2015-08-10 DIAGNOSIS — E119 Type 2 diabetes mellitus without complications: Secondary | ICD-10-CM | POA: Diagnosis not present

## 2015-08-10 DIAGNOSIS — N2581 Secondary hyperparathyroidism of renal origin: Secondary | ICD-10-CM | POA: Diagnosis not present

## 2015-08-10 DIAGNOSIS — D631 Anemia in chronic kidney disease: Secondary | ICD-10-CM | POA: Diagnosis not present

## 2015-08-10 DIAGNOSIS — N186 End stage renal disease: Secondary | ICD-10-CM | POA: Diagnosis not present

## 2015-08-10 DIAGNOSIS — D509 Iron deficiency anemia, unspecified: Secondary | ICD-10-CM | POA: Diagnosis not present

## 2015-08-10 DIAGNOSIS — E1129 Type 2 diabetes mellitus with other diabetic kidney complication: Secondary | ICD-10-CM | POA: Diagnosis not present

## 2015-08-11 ENCOUNTER — Other Ambulatory Visit: Payer: Self-pay | Admitting: *Deleted

## 2015-08-11 DIAGNOSIS — M6281 Muscle weakness (generalized): Secondary | ICD-10-CM | POA: Diagnosis not present

## 2015-08-11 DIAGNOSIS — E1122 Type 2 diabetes mellitus with diabetic chronic kidney disease: Secondary | ICD-10-CM | POA: Diagnosis not present

## 2015-08-11 DIAGNOSIS — H538 Other visual disturbances: Secondary | ICD-10-CM | POA: Diagnosis not present

## 2015-08-11 DIAGNOSIS — I132 Hypertensive heart and chronic kidney disease with heart failure and with stage 5 chronic kidney disease, or end stage renal disease: Secondary | ICD-10-CM | POA: Diagnosis not present

## 2015-08-11 DIAGNOSIS — I69398 Other sequelae of cerebral infarction: Secondary | ICD-10-CM | POA: Diagnosis not present

## 2015-08-11 DIAGNOSIS — E1140 Type 2 diabetes mellitus with diabetic neuropathy, unspecified: Secondary | ICD-10-CM | POA: Diagnosis not present

## 2015-08-11 NOTE — Patient Outreach (Signed)
Pt is doing very well but still says his health is poor. Wife asked me to find the ENT pt had seen before his stroke and reschedule the appt that she thought was today, which I was able to do. Next appt 08/23/15 at 1:15 pm. Pt is participating in his therapy twice a week. He goes to dialysis Mondays, Wednesdays and Fridays. Mrs. Vaden continues to provide any care at home that he requires but she is not in top form herself. [facility] CSW is to return to assist with some financial assistance planning.  I will call pt again next week. I have encouraged them to call me like she did this am if they need any help with anything or if they identify a medical problem.  Deloria Lair [facility] [place] [phone]

## 2015-08-12 DIAGNOSIS — D509 Iron deficiency anemia, unspecified: Secondary | ICD-10-CM | POA: Diagnosis not present

## 2015-08-12 DIAGNOSIS — E119 Type 2 diabetes mellitus without complications: Secondary | ICD-10-CM | POA: Diagnosis not present

## 2015-08-12 DIAGNOSIS — N2581 Secondary hyperparathyroidism of renal origin: Secondary | ICD-10-CM | POA: Diagnosis not present

## 2015-08-12 DIAGNOSIS — D631 Anemia in chronic kidney disease: Secondary | ICD-10-CM | POA: Diagnosis not present

## 2015-08-12 DIAGNOSIS — N186 End stage renal disease: Secondary | ICD-10-CM | POA: Diagnosis not present

## 2015-08-15 DIAGNOSIS — E119 Type 2 diabetes mellitus without complications: Secondary | ICD-10-CM | POA: Diagnosis not present

## 2015-08-15 DIAGNOSIS — D509 Iron deficiency anemia, unspecified: Secondary | ICD-10-CM | POA: Diagnosis not present

## 2015-08-15 DIAGNOSIS — D631 Anemia in chronic kidney disease: Secondary | ICD-10-CM | POA: Diagnosis not present

## 2015-08-15 DIAGNOSIS — N186 End stage renal disease: Secondary | ICD-10-CM | POA: Diagnosis not present

## 2015-08-15 DIAGNOSIS — E1129 Type 2 diabetes mellitus with other diabetic kidney complication: Secondary | ICD-10-CM | POA: Diagnosis not present

## 2015-08-15 DIAGNOSIS — N2581 Secondary hyperparathyroidism of renal origin: Secondary | ICD-10-CM | POA: Diagnosis not present

## 2015-08-15 DIAGNOSIS — Z992 Dependence on renal dialysis: Secondary | ICD-10-CM | POA: Diagnosis not present

## 2015-08-15 NOTE — Progress Notes (Signed)
Internal Medicine Clinic Attending  Case discussed with Dr. Rivet soon after the resident saw the patient.  We reviewed the resident's history and exam and pertinent patient test results.  I agree with the assessment, diagnosis, and plan of care documented in the resident's note.  

## 2015-08-16 DIAGNOSIS — H538 Other visual disturbances: Secondary | ICD-10-CM | POA: Diagnosis not present

## 2015-08-16 DIAGNOSIS — E1122 Type 2 diabetes mellitus with diabetic chronic kidney disease: Secondary | ICD-10-CM | POA: Diagnosis not present

## 2015-08-16 DIAGNOSIS — I69398 Other sequelae of cerebral infarction: Secondary | ICD-10-CM | POA: Diagnosis not present

## 2015-08-16 DIAGNOSIS — E1140 Type 2 diabetes mellitus with diabetic neuropathy, unspecified: Secondary | ICD-10-CM | POA: Diagnosis not present

## 2015-08-16 DIAGNOSIS — I132 Hypertensive heart and chronic kidney disease with heart failure and with stage 5 chronic kidney disease, or end stage renal disease: Secondary | ICD-10-CM | POA: Diagnosis not present

## 2015-08-16 DIAGNOSIS — M6281 Muscle weakness (generalized): Secondary | ICD-10-CM | POA: Diagnosis not present

## 2015-08-17 DIAGNOSIS — D631 Anemia in chronic kidney disease: Secondary | ICD-10-CM | POA: Diagnosis not present

## 2015-08-17 DIAGNOSIS — D509 Iron deficiency anemia, unspecified: Secondary | ICD-10-CM | POA: Diagnosis not present

## 2015-08-17 DIAGNOSIS — N2581 Secondary hyperparathyroidism of renal origin: Secondary | ICD-10-CM | POA: Diagnosis not present

## 2015-08-17 DIAGNOSIS — E119 Type 2 diabetes mellitus without complications: Secondary | ICD-10-CM | POA: Diagnosis not present

## 2015-08-17 DIAGNOSIS — N186 End stage renal disease: Secondary | ICD-10-CM | POA: Diagnosis not present

## 2015-08-18 ENCOUNTER — Encounter: Payer: Self-pay | Admitting: *Deleted

## 2015-08-18 ENCOUNTER — Other Ambulatory Visit: Payer: Self-pay | Admitting: *Deleted

## 2015-08-18 DIAGNOSIS — E1122 Type 2 diabetes mellitus with diabetic chronic kidney disease: Secondary | ICD-10-CM | POA: Diagnosis not present

## 2015-08-18 DIAGNOSIS — E1140 Type 2 diabetes mellitus with diabetic neuropathy, unspecified: Secondary | ICD-10-CM | POA: Diagnosis not present

## 2015-08-18 DIAGNOSIS — I132 Hypertensive heart and chronic kidney disease with heart failure and with stage 5 chronic kidney disease, or end stage renal disease: Secondary | ICD-10-CM | POA: Diagnosis not present

## 2015-08-18 DIAGNOSIS — M6281 Muscle weakness (generalized): Secondary | ICD-10-CM | POA: Diagnosis not present

## 2015-08-18 DIAGNOSIS — H538 Other visual disturbances: Secondary | ICD-10-CM | POA: Diagnosis not present

## 2015-08-18 DIAGNOSIS — I69398 Other sequelae of cerebral infarction: Secondary | ICD-10-CM | POA: Diagnosis not present

## 2015-08-18 NOTE — Patient Outreach (Signed)
Transition of care call #4. Pt is doing very well. He had an appt with his neurologist last week and doesn't have to return for 6 months. He continues to work with PT/OT and continues to improve. He has all his meds and is taking them as prescribed.  I have advised that this will be my last transition of care call and that if they have any questions in the future they can call on me. They expressed their appreciation for the outreaches and for our CSW assistance with the Medicaid application.  Deloria Lair [facility] [place] [phone]

## 2015-08-19 DIAGNOSIS — E119 Type 2 diabetes mellitus without complications: Secondary | ICD-10-CM | POA: Diagnosis not present

## 2015-08-19 DIAGNOSIS — N186 End stage renal disease: Secondary | ICD-10-CM | POA: Diagnosis not present

## 2015-08-19 DIAGNOSIS — N2581 Secondary hyperparathyroidism of renal origin: Secondary | ICD-10-CM | POA: Diagnosis not present

## 2015-08-19 DIAGNOSIS — D509 Iron deficiency anemia, unspecified: Secondary | ICD-10-CM | POA: Diagnosis not present

## 2015-08-19 DIAGNOSIS — D631 Anemia in chronic kidney disease: Secondary | ICD-10-CM | POA: Diagnosis not present

## 2015-08-22 DIAGNOSIS — I69398 Other sequelae of cerebral infarction: Secondary | ICD-10-CM | POA: Diagnosis not present

## 2015-08-22 DIAGNOSIS — D509 Iron deficiency anemia, unspecified: Secondary | ICD-10-CM | POA: Diagnosis not present

## 2015-08-22 DIAGNOSIS — N2581 Secondary hyperparathyroidism of renal origin: Secondary | ICD-10-CM | POA: Diagnosis not present

## 2015-08-22 DIAGNOSIS — N186 End stage renal disease: Secondary | ICD-10-CM | POA: Diagnosis not present

## 2015-08-22 DIAGNOSIS — E119 Type 2 diabetes mellitus without complications: Secondary | ICD-10-CM | POA: Diagnosis not present

## 2015-08-22 DIAGNOSIS — H538 Other visual disturbances: Secondary | ICD-10-CM | POA: Diagnosis not present

## 2015-08-22 DIAGNOSIS — D631 Anemia in chronic kidney disease: Secondary | ICD-10-CM | POA: Diagnosis not present

## 2015-08-22 DIAGNOSIS — E1140 Type 2 diabetes mellitus with diabetic neuropathy, unspecified: Secondary | ICD-10-CM | POA: Diagnosis not present

## 2015-08-22 DIAGNOSIS — M6281 Muscle weakness (generalized): Secondary | ICD-10-CM | POA: Diagnosis not present

## 2015-08-23 DIAGNOSIS — E1122 Type 2 diabetes mellitus with diabetic chronic kidney disease: Secondary | ICD-10-CM | POA: Diagnosis not present

## 2015-08-23 DIAGNOSIS — H538 Other visual disturbances: Secondary | ICD-10-CM | POA: Diagnosis not present

## 2015-08-23 DIAGNOSIS — E1140 Type 2 diabetes mellitus with diabetic neuropathy, unspecified: Secondary | ICD-10-CM | POA: Diagnosis not present

## 2015-08-23 DIAGNOSIS — I69398 Other sequelae of cerebral infarction: Secondary | ICD-10-CM | POA: Diagnosis not present

## 2015-08-23 DIAGNOSIS — I132 Hypertensive heart and chronic kidney disease with heart failure and with stage 5 chronic kidney disease, or end stage renal disease: Secondary | ICD-10-CM | POA: Diagnosis not present

## 2015-08-23 DIAGNOSIS — M6281 Muscle weakness (generalized): Secondary | ICD-10-CM | POA: Diagnosis not present

## 2015-08-24 DIAGNOSIS — E119 Type 2 diabetes mellitus without complications: Secondary | ICD-10-CM | POA: Diagnosis not present

## 2015-08-24 DIAGNOSIS — D509 Iron deficiency anemia, unspecified: Secondary | ICD-10-CM | POA: Diagnosis not present

## 2015-08-24 DIAGNOSIS — D631 Anemia in chronic kidney disease: Secondary | ICD-10-CM | POA: Diagnosis not present

## 2015-08-24 DIAGNOSIS — N186 End stage renal disease: Secondary | ICD-10-CM | POA: Diagnosis not present

## 2015-08-24 DIAGNOSIS — N2581 Secondary hyperparathyroidism of renal origin: Secondary | ICD-10-CM | POA: Diagnosis not present

## 2015-08-25 DIAGNOSIS — M6281 Muscle weakness (generalized): Secondary | ICD-10-CM | POA: Diagnosis not present

## 2015-08-25 DIAGNOSIS — I69398 Other sequelae of cerebral infarction: Secondary | ICD-10-CM | POA: Diagnosis not present

## 2015-08-25 DIAGNOSIS — H538 Other visual disturbances: Secondary | ICD-10-CM | POA: Diagnosis not present

## 2015-08-25 DIAGNOSIS — E1122 Type 2 diabetes mellitus with diabetic chronic kidney disease: Secondary | ICD-10-CM | POA: Diagnosis not present

## 2015-08-25 DIAGNOSIS — E1140 Type 2 diabetes mellitus with diabetic neuropathy, unspecified: Secondary | ICD-10-CM | POA: Diagnosis not present

## 2015-08-25 DIAGNOSIS — I132 Hypertensive heart and chronic kidney disease with heart failure and with stage 5 chronic kidney disease, or end stage renal disease: Secondary | ICD-10-CM | POA: Diagnosis not present

## 2015-08-26 DIAGNOSIS — N186 End stage renal disease: Secondary | ICD-10-CM | POA: Diagnosis not present

## 2015-08-26 DIAGNOSIS — D509 Iron deficiency anemia, unspecified: Secondary | ICD-10-CM | POA: Diagnosis not present

## 2015-08-26 DIAGNOSIS — N2581 Secondary hyperparathyroidism of renal origin: Secondary | ICD-10-CM | POA: Diagnosis not present

## 2015-08-26 DIAGNOSIS — D631 Anemia in chronic kidney disease: Secondary | ICD-10-CM | POA: Diagnosis not present

## 2015-08-26 DIAGNOSIS — E119 Type 2 diabetes mellitus without complications: Secondary | ICD-10-CM | POA: Diagnosis not present

## 2015-08-29 DIAGNOSIS — N2581 Secondary hyperparathyroidism of renal origin: Secondary | ICD-10-CM | POA: Diagnosis not present

## 2015-08-29 DIAGNOSIS — N186 End stage renal disease: Secondary | ICD-10-CM | POA: Diagnosis not present

## 2015-08-29 DIAGNOSIS — D631 Anemia in chronic kidney disease: Secondary | ICD-10-CM | POA: Diagnosis not present

## 2015-08-29 DIAGNOSIS — E119 Type 2 diabetes mellitus without complications: Secondary | ICD-10-CM | POA: Diagnosis not present

## 2015-08-29 DIAGNOSIS — D509 Iron deficiency anemia, unspecified: Secondary | ICD-10-CM | POA: Diagnosis not present

## 2015-08-30 ENCOUNTER — Ambulatory Visit (INDEPENDENT_AMBULATORY_CARE_PROVIDER_SITE_OTHER): Payer: Medicare Other | Admitting: Internal Medicine

## 2015-08-30 ENCOUNTER — Encounter: Payer: Self-pay | Admitting: Internal Medicine

## 2015-08-30 VITALS — BP 120/64 | HR 70 | Temp 98.3°F | Wt 226.3 lb

## 2015-08-30 DIAGNOSIS — H903 Sensorineural hearing loss, bilateral: Secondary | ICD-10-CM | POA: Diagnosis not present

## 2015-08-30 DIAGNOSIS — I639 Cerebral infarction, unspecified: Secondary | ICD-10-CM | POA: Diagnosis not present

## 2015-08-30 DIAGNOSIS — N186 End stage renal disease: Secondary | ICD-10-CM

## 2015-08-30 DIAGNOSIS — IMO0002 Reserved for concepts with insufficient information to code with codable children: Secondary | ICD-10-CM

## 2015-08-30 DIAGNOSIS — E1165 Type 2 diabetes mellitus with hyperglycemia: Secondary | ICD-10-CM

## 2015-08-30 DIAGNOSIS — H8143 Vertigo of central origin, bilateral: Secondary | ICD-10-CM | POA: Diagnosis not present

## 2015-08-30 DIAGNOSIS — E1122 Type 2 diabetes mellitus with diabetic chronic kidney disease: Secondary | ICD-10-CM

## 2015-08-30 MED ORDER — INSULIN PEN NEEDLE 32G X 4 MM MISC: Status: DC

## 2015-08-30 MED ORDER — INSULIN GLARGINE 100 UNIT/ML SOLOSTAR PEN: 10.0000 [IU] | PEN_INJECTOR | Freq: Every day | SUBCUTANEOUS | Status: DC

## 2015-08-30 MED ORDER — GLUCOSE BLOOD VI STRP: ORAL_STRIP | Status: DC

## 2015-08-30 MED ORDER — INSULIN LISPRO 100 UNIT/ML (KWIKPEN): 3.0000 [IU] | PEN_INJECTOR | Freq: Three times a day (TID) | SUBCUTANEOUS | Status: DC

## 2015-08-30 MED ORDER — ONETOUCH VERIO FLEX SYSTEM W/DEVICE KIT: 1.0000 | PACK | Freq: Once | Status: DC

## 2015-08-30 MED ORDER — ONETOUCH DELICA LANCETS FINE MISC: Status: DC

## 2015-08-30 NOTE — Patient Instructions (Signed)
-   Start taking Lantus 10 units at bedtime TONIGHT - Take Lantus at the [place] - Start taking Humalog 3 units at each meal. Start this at [place] have ordered a new glucometer, test strips, lancets, and pen needles. Please pick up these supplies. - Follow up in 1-2 weeks to adjust insulin regimen - If blood sugars are less than 70 please call the clinic  General Instructions:   Please bring your medicines with you each time you come to clinic.  Medicines may include prescription medications, over-the-counter medications, herbal remedies, eye drops, vitamins, or other pills.   Progress Toward Treatment Goals:  Treatment Goal 08/04/2015  Hemoglobin A1C improved  Blood pressure at goal    Self Care Goals & Plans:  Self Care Goal 08/30/2015  Manage my medications take my medicines as prescribed; bring my medications to every visit; refill my medications on time  Monitor my health keep track of my blood glucose; bring my glucose meter and log to each visit; keep track of my blood pressure; check my feet daily  Eat healthy foods eat more vegetables; eat foods that are low in salt; eat baked foods instead of fried foods  Be physically active take the stairs instead of the elevator    Home Blood Glucose Monitoring 08/04/2015  Check my blood sugar 3 times a day  When to check my blood sugar before meals     Care Management & Community Referrals:  No flowsheet data found.

## 2015-08-31 ENCOUNTER — Other Ambulatory Visit: Payer: Self-pay | Admitting: Dietician

## 2015-08-31 DIAGNOSIS — N186 End stage renal disease: Secondary | ICD-10-CM | POA: Diagnosis not present

## 2015-08-31 DIAGNOSIS — N2581 Secondary hyperparathyroidism of renal origin: Secondary | ICD-10-CM | POA: Diagnosis not present

## 2015-08-31 DIAGNOSIS — IMO0002 Reserved for concepts with insufficient information to code with codable children: Secondary | ICD-10-CM

## 2015-08-31 DIAGNOSIS — E1122 Type 2 diabetes mellitus with diabetic chronic kidney disease: Secondary | ICD-10-CM

## 2015-08-31 DIAGNOSIS — D631 Anemia in chronic kidney disease: Secondary | ICD-10-CM | POA: Diagnosis not present

## 2015-08-31 DIAGNOSIS — E119 Type 2 diabetes mellitus without complications: Secondary | ICD-10-CM | POA: Diagnosis not present

## 2015-08-31 DIAGNOSIS — D509 Iron deficiency anemia, unspecified: Secondary | ICD-10-CM | POA: Diagnosis not present

## 2015-08-31 DIAGNOSIS — E1165 Type 2 diabetes mellitus with hyperglycemia: Principal | ICD-10-CM

## 2015-08-31 MED ORDER — ACCU-CHEK AVIVA CONNECT W/DEVICE KIT: 1.0000 | PACK | Freq: Four times a day (QID) | Status: DC

## 2015-08-31 MED ORDER — ACCU-CHEK FASTCLIX LANCETS MISC: Status: DC

## 2015-08-31 MED ORDER — GLUCOSE BLOOD VI STRP
ORAL_STRIP | Status: DC
Start: 2015-08-31 — End: 2015-11-29

## 2015-08-31 NOTE — Telephone Encounter (Signed)
Sample accu chek aviva  meter provided to patient today. wife instructed during office visit how to use it. They need refills of lancets and strips to use with this meter

## 2015-09-02 DIAGNOSIS — N186 End stage renal disease: Secondary | ICD-10-CM | POA: Diagnosis not present

## 2015-09-02 DIAGNOSIS — D631 Anemia in chronic kidney disease: Secondary | ICD-10-CM | POA: Diagnosis not present

## 2015-09-02 DIAGNOSIS — D509 Iron deficiency anemia, unspecified: Secondary | ICD-10-CM | POA: Diagnosis not present

## 2015-09-02 DIAGNOSIS — E119 Type 2 diabetes mellitus without complications: Secondary | ICD-10-CM | POA: Diagnosis not present

## 2015-09-02 DIAGNOSIS — N2581 Secondary hyperparathyroidism of renal origin: Secondary | ICD-10-CM | POA: Diagnosis not present

## 2015-09-02 NOTE — Progress Notes (Signed)
   Subjective:    Patient ID: Ernest [name], male    DOB: March 27, 1946, 69 y.o.   MRN: [id number]  HPI [name] is a 69yo man with PMHx of HTN, CAD, type 2 DM with peripheral neuropathy, combined HF, COPD, and ESRD who presents today for follow up of his diabetes. His wife accompanied him to the visit and provided some history.   Last HbA1c 7.7 in Sept. He reports most of his blood sugars have been in the 140-200 range in the morning and then higher in the 250-300 range in the evening. However, upon review of his glucometer, most blood sugars are 250 or greater throughout the day. Lowest blood sugar noted was 170. He currently takes Humulin N 8 units BID. Both patient and wife are interested in switching to a different insulin regimen to attain better control of his diabetes. His wife gives his insulin injections.    Review of Systems General: Denies fever, chills, night sweats, changes in weight, changes in appetite HEENT: Denies headaches, ear pain, changes in vision, rhinorrhea, sore throat CV: Denies CP, palpitations, SOB, orthopnea Pulm: Denies SOB, cough, wheezing GI: Denies abdominal pain, nausea, vomiting, diarrhea, constipation, melena, hematochezia GU: Denies dysuria, hematuria, frequency Msk: Denies muscle cramps, joint pains Neuro: Denies weakness, numbness, tingling Skin: Denies rashes, bruising Psych: Denies depression, anxiety, hallucinations    Objective:   Physical Exam General: alert, sitting up, NAD HEENT: [place]/AT, left ophthalmoplegia present, mucus membranes moist CV: RRR, no m/g/r Pulm: CTA bilaterally, breaths non-labored Abd: BS+, soft, obese, non-tender Ext: warm, no peripheral edema Neuro: alert and oriented x 3. Slightly dysarthric due to prior stroke. Strength intact in upper and lower extremities.       Assessment & Plan:  Please refer to A&P documentation.

## 2015-09-02 NOTE — Assessment & Plan Note (Addendum)
Lab Results  Component Value Date   HGBA1C 7.7* 07/12/2015   HGBA1C 6.6* 06/10/2013     Assessment: Diabetes control: fair control Progress toward A1C goal:  unchanged Comments: Patient and wife have agreed to switch to a different insulin regimen given his elevated blood sugars. Will switch to a Lantus and Novolog regimen.   Plan: Medications:  Stop Humulin N. Start Lantus 10 units QHS and Novolog 3 units TID with meals.  Home glucose monitoring: Frequency: 3 times a day Timing: before meals, at bedtime Instruction/counseling given: reminded to bring blood glucose meter & log to each visit, reminded to bring medications to each visit and discussed diet Self management tools provided: instructions for home glucose monitoring, home glucose testing supplies, home glucose meter Other plans:  - Discussed importance of testing 3 times daily + bedtime with new insulin regimen - Discussed how to use insulin pens and sites of injection - Provided Rxs for new glucometer, test strips, lancets, pen needles - f/u in 1 week to reassess blood sugars and adjust insulin regimen as necessary - Referral to Nutrition and Diabetes education

## 2015-09-05 DIAGNOSIS — N186 End stage renal disease: Secondary | ICD-10-CM | POA: Diagnosis not present

## 2015-09-05 DIAGNOSIS — E119 Type 2 diabetes mellitus without complications: Secondary | ICD-10-CM | POA: Diagnosis not present

## 2015-09-05 DIAGNOSIS — N2581 Secondary hyperparathyroidism of renal origin: Secondary | ICD-10-CM | POA: Diagnosis not present

## 2015-09-05 DIAGNOSIS — D631 Anemia in chronic kidney disease: Secondary | ICD-10-CM | POA: Diagnosis not present

## 2015-09-05 DIAGNOSIS — D509 Iron deficiency anemia, unspecified: Secondary | ICD-10-CM | POA: Diagnosis not present

## 2015-09-06 ENCOUNTER — Other Ambulatory Visit: Payer: Self-pay | Admitting: *Deleted

## 2015-09-06 NOTE — Progress Notes (Signed)
Case discussed with Dr. Rivet soon after the resident saw the patient.  We reviewed the resident's history and exam and pertinent patient test results.  I agree with the assessment, diagnosis and plan of care documented in the resident's note. 

## 2015-09-06 NOTE — Patient Outreach (Signed)
[facility]) Care Management  [facility] Social Work  09/06/2015  Ernest [name] 09/06/1946 [id number]  Subjective:    Objective:   Current Medications:  Current Outpatient Prescriptions  Medication Sig Dispense Refill  . ACCU-CHEK FASTCLIX LANCETS MISC Check blood sugar 4 times a day before meals and bedtime 102 each 5  . allopurinol (ZYLOPRIM) 100 MG tablet Take 1 tablet (100 mg total) by mouth daily. 30 tablet 1  . amitriptyline (ELAVIL) 25 MG tablet TAKE ONE TABLET BY MOUTH AT BEDTIME FOR  NEUROPATHY 90 tablet 0  . aspirin EC 81 MG tablet Take 81 mg by mouth daily.    [name] atorvastatin (LIPITOR) 40 MG tablet Take 1 tablet (40 mg total) by mouth daily. 30 tablet 3  . Blood Glucose Monitoring Suppl (ACCU-CHEK AVIVA CONNECT) W/DEVICE KIT 1 each by Does not apply route QID. 1 kit 0  . cinacalcet (SENSIPAR) 60 MG tablet Take 1 tablet (60 mg total) by mouth 2 (two) times daily. 60 tablet 0  . clopidogrel (PLAVIX) 75 MG tablet Take 1 tablet (75 mg total) by mouth daily. 30 tablet 1  . colchicine 0.6 MG tablet 1 per day on Monday and Friday 30 tablet 0  . glucose blood (ACCU-CHEK AVIVA PLUS) test strip Check blood sugar 4 times a day before meals and bedtime 125 each 5  . Insulin Glargine (LANTUS) 100 UNIT/ML Solostar Pen Inject 10 Units into the skin at bedtime. 15 mL 3  . insulin lispro (HUMALOG) 100 UNIT/ML KiwkPen Inject 0.03 mLs (3 Units total) into the skin 3 (three) times daily with meals. 15 mL 3  . Insulin Pen Needle 32G X 4 MM MISC Use one pen needle per insulin injection 100 each 11  . levETIRAcetam (KEPPRA) 500 MG tablet Take 2 tablets (1,000 mg total) by mouth daily. Then take extra 500 mg (1 tablet) on MWF after HD 72 tablet 5  . lisinopril (PRINIVIL,ZESTRIL) 20 MG tablet Take 1 tablet (20 mg total) by mouth at bedtime. 30 tablet 1  . metoprolol tartrate (LOPRESSOR) 25 MG tablet Take 0.5 tablets (12.5 mg total) by mouth 2 (two) times daily. (Patient taking  differently: Take 25 mg by mouth 2 (two) times daily. ) 60 tablet 1  . multivitamin (RENA-VIT) TABS tablet Take 1 tablet by mouth daily.     . sevelamer carbonate (RENVELA) 800 MG tablet Take 1 tablet (800 mg total) by mouth 3 (three) times daily with meals. 90 tablet 0   No current facility-administered medications for this visit.    Functional Status:  In your present state of health, do you have any difficulty performing the following activities: 08/30/2015 08/11/2015  Hearing? Tempie Donning  Vision? N Y  Difficulty concentrating or making decisions? N Y  Walking or climbing stairs? Y Y  Dressing or bathing? N N  Doing errands, shopping? Y Y  Conservation officer, nature and eating ? - N  Using the Toilet? - N  In the past six months, have you accidently leaked urine? - N  Do you have problems with loss of bowel control? - N  Managing your Medications? - N  Managing your Finances? - Y  Housekeeping or managing your Housekeeping? - Y    Fall/Depression Screening:  PHQ 2/9 Scores 08/30/2015 08/11/2015 08/04/2015 08/04/2015 07/28/2013 07/06/2013  PHQ - 2 Score 0 0 1 1 0 0    Assessment:  Home visit to patient's home to assist with Medicaid application.  Patient's spouse has collected medical bills and  bank statements to complete medicaid applications.  This social work will submit application to the Department of Social Services and once patient is assigned a Insurance account manager, patient's case to be closed to [facility] care management.   Advanced Directive and Living Will completed during last visit, however not notarized.  Per patient's spouse, she will take it to her bank to get the document notarized.  Patient reports doing well, HH has concluded.  Patient continues to go to Dialysis 3 days a week, patient's spouse provides transportation as well as the [employer] bus at times.  Plan:  This Education officer, museum will follow up with patient within 30 days regarding assignment of Medicaid social worker.       [facility] CM Care  Plan Problem One        Most Recent Value   Care Plan Problem One  needs medicaid   Role Documenting the Problem One  Clinical Social Worker   Care Plan for Problem One  Active   [facility] CM Short Term Goal #1 (0-30 days)  patient to verbalize being assigned  to a Surveyor, mining through the [place] within 30 days   [facility] CM Short Term Goal #1 Start Date  09/06/15   [facility] CM Short Term Goal #1 Met Date  09/06/15   Interventions for Short Term Goal #1  submission of Mediciaid application to the Department of Social [place]    [facility] CM Care Plan Problem Two        Most Recent Value   Care Plan Problem Two  Right sided weakness from CVA.   Role Documenting the Problem Two  Care Management Coordinator   Care Plan for Problem Two  Active   [facility] CM Short Term Goal #1 (0-30 days)  Pt to do home exercises every day he does not have PT and report to me each week.   [facility] CM Short Term Goal #1 Start Date  08/11/15   [facility] CM Short Term Goal #1 Met Date   08/18/15   Interventions for Short Term Goal #2   Discussed the importance of him doing exercises every day so he can reach his personal goal to have top function.    [facility] CM Care Plan Problem Three        Most Recent Value   Care Plan Problem Three  No Living Will   Role Documenting the Problem Three  Care Management Coordinator   Care Plan for Problem Three  Active   [facility] CM Short Term Goal #1 (0-30 days)  Pt will complete a MOST form and post this in his home within the next 30 days.   [facility] CM Short Term Goal #1 Start Date  08/11/15   [facility] CM Short Term Goal #1 Met Date  08/18/15   Interventions for Short Term Goal #1  Will mail MOST form for pt to complete and post. Discussed the importance of this document in the home.   [facility] CM Short Term Goal #2 (0-30 days)  Pt will complete the Living Will provided and have it notorized in the next 30 days.   [facility] CM Short Term Goal #2 Start Date  08/11/15   [facility] CM Short Term Goal #2  Met Date  08/18/15   Interventions for Short Term Goal #2  Pt has already received the document and has completed it but has not had it notorized. Suggest he ask if there is a notary at the dialysis center.  [name] [facility] Care Management [phone]

## 2015-09-07 ENCOUNTER — Other Ambulatory Visit: Payer: Self-pay | Admitting: *Deleted

## 2015-09-07 ENCOUNTER — Encounter: Payer: Self-pay | Admitting: *Deleted

## 2015-09-07 DIAGNOSIS — N2581 Secondary hyperparathyroidism of renal origin: Secondary | ICD-10-CM | POA: Diagnosis not present

## 2015-09-07 DIAGNOSIS — D509 Iron deficiency anemia, unspecified: Secondary | ICD-10-CM | POA: Diagnosis not present

## 2015-09-07 DIAGNOSIS — E119 Type 2 diabetes mellitus without complications: Secondary | ICD-10-CM | POA: Diagnosis not present

## 2015-09-07 DIAGNOSIS — D631 Anemia in chronic kidney disease: Secondary | ICD-10-CM | POA: Diagnosis not present

## 2015-09-07 DIAGNOSIS — N186 End stage renal disease: Secondary | ICD-10-CM | POA: Diagnosis not present

## 2015-09-10 DIAGNOSIS — N186 End stage renal disease: Secondary | ICD-10-CM | POA: Diagnosis not present

## 2015-09-10 DIAGNOSIS — E119 Type 2 diabetes mellitus without complications: Secondary | ICD-10-CM | POA: Diagnosis not present

## 2015-09-10 DIAGNOSIS — D631 Anemia in chronic kidney disease: Secondary | ICD-10-CM | POA: Diagnosis not present

## 2015-09-10 DIAGNOSIS — N2581 Secondary hyperparathyroidism of renal origin: Secondary | ICD-10-CM | POA: Diagnosis not present

## 2015-09-10 DIAGNOSIS — D509 Iron deficiency anemia, unspecified: Secondary | ICD-10-CM | POA: Diagnosis not present

## 2015-09-12 DIAGNOSIS — N2581 Secondary hyperparathyroidism of renal origin: Secondary | ICD-10-CM | POA: Diagnosis not present

## 2015-09-12 DIAGNOSIS — E119 Type 2 diabetes mellitus without complications: Secondary | ICD-10-CM | POA: Diagnosis not present

## 2015-09-12 DIAGNOSIS — D509 Iron deficiency anemia, unspecified: Secondary | ICD-10-CM | POA: Diagnosis not present

## 2015-09-12 DIAGNOSIS — D631 Anemia in chronic kidney disease: Secondary | ICD-10-CM | POA: Diagnosis not present

## 2015-09-12 DIAGNOSIS — N186 End stage renal disease: Secondary | ICD-10-CM | POA: Diagnosis not present

## 2015-09-14 DIAGNOSIS — N186 End stage renal disease: Secondary | ICD-10-CM | POA: Diagnosis not present

## 2015-09-14 DIAGNOSIS — Z992 Dependence on renal dialysis: Secondary | ICD-10-CM | POA: Diagnosis not present

## 2015-09-14 DIAGNOSIS — D631 Anemia in chronic kidney disease: Secondary | ICD-10-CM | POA: Diagnosis not present

## 2015-09-14 DIAGNOSIS — E119 Type 2 diabetes mellitus without complications: Secondary | ICD-10-CM | POA: Diagnosis not present

## 2015-09-14 DIAGNOSIS — N2581 Secondary hyperparathyroidism of renal origin: Secondary | ICD-10-CM | POA: Diagnosis not present

## 2015-09-14 DIAGNOSIS — D509 Iron deficiency anemia, unspecified: Secondary | ICD-10-CM | POA: Diagnosis not present

## 2015-09-14 DIAGNOSIS — E1129 Type 2 diabetes mellitus with other diabetic kidney complication: Secondary | ICD-10-CM | POA: Diagnosis not present

## 2015-09-15 ENCOUNTER — Ambulatory Visit (INDEPENDENT_AMBULATORY_CARE_PROVIDER_SITE_OTHER): Payer: Medicare Other | Admitting: Neurology

## 2015-09-15 ENCOUNTER — Encounter: Payer: Self-pay | Admitting: Neurology

## 2015-09-15 VITALS — BP 156/76 | HR 66 | Ht 69.0 in | Wt 224.6 lb

## 2015-09-15 DIAGNOSIS — I639 Cerebral infarction, unspecified: Secondary | ICD-10-CM | POA: Diagnosis not present

## 2015-09-15 DIAGNOSIS — I10 Essential (primary) hypertension: Secondary | ICD-10-CM | POA: Diagnosis not present

## 2015-09-15 DIAGNOSIS — E785 Hyperlipidemia, unspecified: Secondary | ICD-10-CM | POA: Diagnosis not present

## 2015-09-15 DIAGNOSIS — R569 Unspecified convulsions: Secondary | ICD-10-CM | POA: Diagnosis not present

## 2015-09-15 DIAGNOSIS — I69398 Other sequelae of cerebral infarction: Secondary | ICD-10-CM | POA: Diagnosis not present

## 2015-09-15 DIAGNOSIS — I6350 Cerebral infarction due to unspecified occlusion or stenosis of unspecified cerebral artery: Secondary | ICD-10-CM | POA: Diagnosis not present

## 2015-09-15 NOTE — Progress Notes (Signed)
STROKE NEUROLOGY FOLLOW UP NOTE  NAME: Ernest [name] DOB: 1945-11-16  REASON FOR VISIT: stroke follow up HISTORY FROM: pt and chart  Today we had the pleasure of seeing Ernest [name] in follow-up at our Neurology Clinic. Pt was accompanied by no one.   History Summary [name] is a 69 y.o. male with history of ESRD on HD, HLD, DM, HTN, obesity, hepatitis C, COPD, CAD, previous stroke, partial seizure on keppra and CHF on 07/11/15 presenting with diplopia with ocular movement abnormality. CT head no acute abnormality. MRI and MRA not able to do due to penile implant. CUS unremarkable and TTE showed EF 45-50%. LDL 36 and A1C 7.7. His symptoms were thought likely due to pontine infarct secondary to small vessel disease. He was continued on ASA and plavix for stroke and cardiac prevention. Continued on lipitor and recommend close follow up for DM control. He was discharged to CIR in good condition.  Interval History During the interval time, the patient has been doing better. No more double vision but still has slurry speech but getting better too as per pt. He follows up with Dr. Tomi Likens at [facility] on 08/09/15 and continued above medication for stroke prevention. He also follows with Dr. Tomi Likens for partial seizure and on keppra, stable. His BP still high 156/76 today in clinic, he checks BP 3 times per week at HD clinic. He does not know his glucose level at home. Has completed PT/OT course.  REVIEW OF SYSTEMS: Full 14 system review of systems performed and notable only for those listed below and in HPI above, all others are negative:  Constitutional:   Cardiovascular:  Ear/Nose/Throat:   Skin:  Eyes:  Blurry vision Respiratory:   Gastroitestinal:   Genitourinary:  Hematology/Lymphatic:   Endocrine:  Musculoskeletal:   Allergy/Immunology:   Neurological:  Confusion, slurry speech, and seizure Psychiatric:  Sleep:   The following represents the patient's updated  allergies and side effects list: [name]  The neurologically relevant items on the patient's problem list were reviewed on today's visit.  Neurologic Examination  A problem focused neurological exam (12 or more points of the single system neurologic examination, vital signs counts as 1 point, cranial nerves count for 8 points) was performed.  Blood pressure 156/76, pulse 66, height 5\' 9"  (1.753 m), weight 224 lb 9.6 oz (101.878 kg).  General - Well nourished, well developed, in no apparent distress.  Ophthalmologic - Fundi not visualized due to noncooperation.  Cardiovascular - Regular rate and rhythm.  Mental Status -  Level of arousal and orientation to time, place, and person were intact. Language including expression, naming, repetition, comprehension was assessed and found intact. Mild psychomotor slowing. Fund of Knowledge was assessed and was intact.  Cranial Nerves II - XII - II - Visual field intact OU. III, IV, VI - Extraocular movements intact. V - Facial sensation intact bilaterally. VII - Facial movement intact bilaterally. VIII - Hearing & vestibular intact bilaterally. X - Palate elevates symmetrically. XI - Chin turning & shoulder shrug intact bilaterally. XII - Tongue protrusion intact.  Motor Strength - The patient's strength was normal in all extremities and pronator drift was absent.  Bulk was normal and fasciculations were absent.   Motor Tone - Muscle tone was assessed at the neck and appendages and was normal.  Reflexes - The patient's reflexes were 1+ in all extremities and he had no pathological reflexes.  Sensory - Light touch, temperature/pinprick were assessed  and were normal.    Coordination - The patient had normal movements in the hands with no ataxia or dysmetria.  Tremor was absent.  Gait and Station - mildly slow stride.  Data reviewed: I personally reviewed the images and agree with the radiology interpretations.  Dg Chest 2  View 07/11/2015  No acute cardiopulmonary process.   Ct Head Wo Contrast 07/11/2015  Atrophy, chronic ischemic white matter disease and old lacunar infarcts without acute intracranial abnormality. No interval change from 04/25/2015  Repeat CT head - pending  CUS - Bilateral: 1-39% ICA stenosis. Vertebral artery flow is antegrade.  2D echo - - Left ventricle: The cavity size was normal. There was severe concentric hypertrophy. Systolic function was mildly reduced. The estimated ejection fraction was in the range of 45% to 50%. Mild hypokinesis of the basal to mid anterior, anteroseptal and inferoseptal walls. Doppler parameters are consistent with abnormal left ventricular relaxation (grade 1 diastolic dysfunction). - Aortic valve: Transvalvular velocity was within the normal range. There was no stenosis. There was no regurgitation. - Mitral valve: There was trivial regurgitation. - Left atrium: The atrium was mildly dilated. - Right ventricle: The cavity size was normal. Wall thickness was normal. Systolic function was normal.  Component     Latest Ref Rng 07/12/2015  Cholesterol     0 - 200 mg/dL 96  Triglycerides     <150 mg/dL 79  HDL Cholesterol     >40 mg/dL 44  Total CHOL/HDL Ratio      2.2  VLDL     0 - 40 mg/dL 16  LDL (calc)     0 - 99 mg/dL 36  Hemoglobin A1C     4.8 - 5.6 % 7.7 (H)  Mean Plasma Glucose      174  TSH     0.350 - 4.500 uIU/mL 1.064  HIV     Non Reactive Non Reactive    Assessment: As you may recall, he is a 69 y.o. African American male with PMH of ESRD on HD, HLD, DM, HTN, obesity, hepatitis C, COPD, CAD, previous stroke, partial seizure on keppra and CHF admitted on 07/11/15 for diplopia with ocular movement abnormality. CT head no acute abnormality. MRI and MRA not able to do due to penile implant. CUS unremarkable and TTE showed EF 45-50%. LDL 36 and A1C 7.7. His symptoms were thought likely due to pontine infarct  secondary to small vessel disease. He was continued on ASA and plavix for stroke and cardiac prevention. Continued on lipitor and recommend close follow up for DM control. He was discharged to CIR in good condition. During the interval time, double vision resolved but still has slurry speech but getting better. He follows up with Dr. Tomi Likens at [facility].  Plan:  - continue ASA and plavix for stroke and cardiac prevention - continue lipitor for HLD and stroke prevention - check BP and glucose at home and record and bring over to PCP for medication adjustment if needed - home exercise and diabetic diet - Follow up with your primary care physician for stroke risk factor modification. Recommend maintain blood pressure goal <130/80, diabetes with hemoglobin A1c goal below 6.5% and lipids with LDL cholesterol goal below 70 mg/dL.  - follow up with Dr. Tomi Likens as scheduled - follow up with me as needed.  I spent more than 25 minutes of face to face time with the patient. Greater than 50% of time was spent in counseling and coordination of care.  No orders of the defined types were placed in this encounter.    No orders of the defined types were placed in this encounter.    Patient Instructions  - continue ASA and plavix for stroke and cardiac prevention - continue lipitor for HLD and stroke prevention - check BP and glucose at home and record and bring over to PCP for medication adjustment if needed - home exercise and diabetic diet - Follow up with your primary care physician for stroke risk factor modification. Recommend maintain blood pressure goal <130/80, diabetes with hemoglobin A1c goal below 6.5% and lipids with LDL cholesterol goal below 70 mg/dL.  - follow up with Dr. Tomi Likens as scheduled - follow up with me as needed.    Rosalin Hawking, MD PhD [facility] [address] [place], [place] [address] [phone]

## 2015-09-15 NOTE — Patient Instructions (Addendum)
-   continue ASA and plavix for stroke and cardiac prevention - continue lipitor for HLD and stroke prevention - check BP and glucose at home and record and bring over to PCP for medication adjustment if needed - home exercise and diabetic diet - Follow up with your primary care physician for stroke risk factor modification. Recommend maintain blood pressure goal <130/80, diabetes with hemoglobin A1c goal below 6.5% and lipids with LDL cholesterol goal below 70 mg/dL.  - follow up with Dr. Tomi Likens as scheduled - follow up with me as needed.

## 2015-09-16 DIAGNOSIS — D631 Anemia in chronic kidney disease: Secondary | ICD-10-CM | POA: Diagnosis not present

## 2015-09-16 DIAGNOSIS — E119 Type 2 diabetes mellitus without complications: Secondary | ICD-10-CM | POA: Diagnosis not present

## 2015-09-16 DIAGNOSIS — N2581 Secondary hyperparathyroidism of renal origin: Secondary | ICD-10-CM | POA: Diagnosis not present

## 2015-09-16 DIAGNOSIS — N186 End stage renal disease: Secondary | ICD-10-CM | POA: Diagnosis not present

## 2015-09-16 DIAGNOSIS — D509 Iron deficiency anemia, unspecified: Secondary | ICD-10-CM | POA: Diagnosis not present

## 2015-09-18 DIAGNOSIS — E785 Hyperlipidemia, unspecified: Secondary | ICD-10-CM | POA: Insufficient documentation

## 2015-09-18 DIAGNOSIS — R569 Unspecified convulsions: Secondary | ICD-10-CM | POA: Insufficient documentation

## 2015-09-18 DIAGNOSIS — I6350 Cerebral infarction due to unspecified occlusion or stenosis of unspecified cerebral artery: Secondary | ICD-10-CM | POA: Insufficient documentation

## 2015-09-18 DIAGNOSIS — I10 Essential (primary) hypertension: Secondary | ICD-10-CM | POA: Insufficient documentation

## 2015-09-19 DIAGNOSIS — E119 Type 2 diabetes mellitus without complications: Secondary | ICD-10-CM | POA: Diagnosis not present

## 2015-09-19 DIAGNOSIS — D509 Iron deficiency anemia, unspecified: Secondary | ICD-10-CM | POA: Diagnosis not present

## 2015-09-19 DIAGNOSIS — N186 End stage renal disease: Secondary | ICD-10-CM | POA: Diagnosis not present

## 2015-09-19 DIAGNOSIS — N2581 Secondary hyperparathyroidism of renal origin: Secondary | ICD-10-CM | POA: Diagnosis not present

## 2015-09-19 DIAGNOSIS — D631 Anemia in chronic kidney disease: Secondary | ICD-10-CM | POA: Diagnosis not present

## 2015-09-21 DIAGNOSIS — D509 Iron deficiency anemia, unspecified: Secondary | ICD-10-CM | POA: Diagnosis not present

## 2015-09-21 DIAGNOSIS — D631 Anemia in chronic kidney disease: Secondary | ICD-10-CM | POA: Diagnosis not present

## 2015-09-21 DIAGNOSIS — E119 Type 2 diabetes mellitus without complications: Secondary | ICD-10-CM | POA: Diagnosis not present

## 2015-09-21 DIAGNOSIS — N186 End stage renal disease: Secondary | ICD-10-CM | POA: Diagnosis not present

## 2015-09-21 DIAGNOSIS — N2581 Secondary hyperparathyroidism of renal origin: Secondary | ICD-10-CM | POA: Diagnosis not present

## 2015-09-23 DIAGNOSIS — N186 End stage renal disease: Secondary | ICD-10-CM | POA: Diagnosis not present

## 2015-09-23 DIAGNOSIS — D631 Anemia in chronic kidney disease: Secondary | ICD-10-CM | POA: Diagnosis not present

## 2015-09-23 DIAGNOSIS — N2581 Secondary hyperparathyroidism of renal origin: Secondary | ICD-10-CM | POA: Diagnosis not present

## 2015-09-23 DIAGNOSIS — D509 Iron deficiency anemia, unspecified: Secondary | ICD-10-CM | POA: Diagnosis not present

## 2015-09-23 DIAGNOSIS — E119 Type 2 diabetes mellitus without complications: Secondary | ICD-10-CM | POA: Diagnosis not present

## 2015-09-26 DIAGNOSIS — N186 End stage renal disease: Secondary | ICD-10-CM | POA: Diagnosis not present

## 2015-09-26 DIAGNOSIS — D631 Anemia in chronic kidney disease: Secondary | ICD-10-CM | POA: Diagnosis not present

## 2015-09-26 DIAGNOSIS — D509 Iron deficiency anemia, unspecified: Secondary | ICD-10-CM | POA: Diagnosis not present

## 2015-09-26 DIAGNOSIS — N2581 Secondary hyperparathyroidism of renal origin: Secondary | ICD-10-CM | POA: Diagnosis not present

## 2015-09-26 DIAGNOSIS — E119 Type 2 diabetes mellitus without complications: Secondary | ICD-10-CM | POA: Diagnosis not present

## 2015-09-28 DIAGNOSIS — D631 Anemia in chronic kidney disease: Secondary | ICD-10-CM | POA: Diagnosis not present

## 2015-09-28 DIAGNOSIS — E119 Type 2 diabetes mellitus without complications: Secondary | ICD-10-CM | POA: Diagnosis not present

## 2015-09-28 DIAGNOSIS — N2581 Secondary hyperparathyroidism of renal origin: Secondary | ICD-10-CM | POA: Diagnosis not present

## 2015-09-28 DIAGNOSIS — D509 Iron deficiency anemia, unspecified: Secondary | ICD-10-CM | POA: Diagnosis not present

## 2015-09-28 DIAGNOSIS — N186 End stage renal disease: Secondary | ICD-10-CM | POA: Diagnosis not present

## 2015-09-30 ENCOUNTER — Other Ambulatory Visit: Payer: Self-pay | Admitting: *Deleted

## 2015-09-30 ENCOUNTER — Telehealth: Payer: Self-pay | Admitting: Internal Medicine

## 2015-09-30 DIAGNOSIS — N2581 Secondary hyperparathyroidism of renal origin: Secondary | ICD-10-CM | POA: Diagnosis not present

## 2015-09-30 DIAGNOSIS — D509 Iron deficiency anemia, unspecified: Secondary | ICD-10-CM | POA: Diagnosis not present

## 2015-09-30 DIAGNOSIS — D631 Anemia in chronic kidney disease: Secondary | ICD-10-CM | POA: Diagnosis not present

## 2015-09-30 DIAGNOSIS — N186 End stage renal disease: Secondary | ICD-10-CM | POA: Diagnosis not present

## 2015-09-30 DIAGNOSIS — E119 Type 2 diabetes mellitus without complications: Secondary | ICD-10-CM | POA: Diagnosis not present

## 2015-09-30 NOTE — Patient Outreach (Signed)
[facility]) Care Management  09/30/2015  Ernest [name] [birth year] [id number]   This social worker submitted patient's Medicaid application to the Department of [place].  Patient's application scanned in, patient will now  Be assigned a Insurance account manager by mail who will work with this paitent to complete the process.   Plan:  This social worker will follow patient until notification of assigned worker is received.    [name] [facility] Care Management [phone]

## 2015-09-30 NOTE — Telephone Encounter (Signed)
LOV WITH Dr. Zadie Rhine on 01/18/2015 notes being faxed

## 2015-10-03 DIAGNOSIS — D631 Anemia in chronic kidney disease: Secondary | ICD-10-CM | POA: Diagnosis not present

## 2015-10-03 DIAGNOSIS — N186 End stage renal disease: Secondary | ICD-10-CM | POA: Diagnosis not present

## 2015-10-03 DIAGNOSIS — E119 Type 2 diabetes mellitus without complications: Secondary | ICD-10-CM | POA: Diagnosis not present

## 2015-10-03 DIAGNOSIS — D509 Iron deficiency anemia, unspecified: Secondary | ICD-10-CM | POA: Diagnosis not present

## 2015-10-03 DIAGNOSIS — N2581 Secondary hyperparathyroidism of renal origin: Secondary | ICD-10-CM | POA: Diagnosis not present

## 2015-10-05 DIAGNOSIS — D631 Anemia in chronic kidney disease: Secondary | ICD-10-CM | POA: Diagnosis not present

## 2015-10-05 DIAGNOSIS — N186 End stage renal disease: Secondary | ICD-10-CM | POA: Diagnosis not present

## 2015-10-05 DIAGNOSIS — E119 Type 2 diabetes mellitus without complications: Secondary | ICD-10-CM | POA: Diagnosis not present

## 2015-10-05 DIAGNOSIS — D509 Iron deficiency anemia, unspecified: Secondary | ICD-10-CM | POA: Diagnosis not present

## 2015-10-05 DIAGNOSIS — N2581 Secondary hyperparathyroidism of renal origin: Secondary | ICD-10-CM | POA: Diagnosis not present

## 2015-10-07 DIAGNOSIS — N2581 Secondary hyperparathyroidism of renal origin: Secondary | ICD-10-CM | POA: Diagnosis not present

## 2015-10-07 DIAGNOSIS — N186 End stage renal disease: Secondary | ICD-10-CM | POA: Diagnosis not present

## 2015-10-07 DIAGNOSIS — E119 Type 2 diabetes mellitus without complications: Secondary | ICD-10-CM | POA: Diagnosis not present

## 2015-10-07 DIAGNOSIS — D631 Anemia in chronic kidney disease: Secondary | ICD-10-CM | POA: Diagnosis not present

## 2015-10-07 DIAGNOSIS — D509 Iron deficiency anemia, unspecified: Secondary | ICD-10-CM | POA: Diagnosis not present

## 2015-10-10 DIAGNOSIS — N186 End stage renal disease: Secondary | ICD-10-CM | POA: Diagnosis not present

## 2015-10-10 DIAGNOSIS — D509 Iron deficiency anemia, unspecified: Secondary | ICD-10-CM | POA: Diagnosis not present

## 2015-10-10 DIAGNOSIS — N2581 Secondary hyperparathyroidism of renal origin: Secondary | ICD-10-CM | POA: Diagnosis not present

## 2015-10-10 DIAGNOSIS — D631 Anemia in chronic kidney disease: Secondary | ICD-10-CM | POA: Diagnosis not present

## 2015-10-10 DIAGNOSIS — E119 Type 2 diabetes mellitus without complications: Secondary | ICD-10-CM | POA: Diagnosis not present

## 2015-10-12 DIAGNOSIS — E119 Type 2 diabetes mellitus without complications: Secondary | ICD-10-CM | POA: Diagnosis not present

## 2015-10-12 DIAGNOSIS — D631 Anemia in chronic kidney disease: Secondary | ICD-10-CM | POA: Diagnosis not present

## 2015-10-12 DIAGNOSIS — N2581 Secondary hyperparathyroidism of renal origin: Secondary | ICD-10-CM | POA: Diagnosis not present

## 2015-10-12 DIAGNOSIS — N186 End stage renal disease: Secondary | ICD-10-CM | POA: Diagnosis not present

## 2015-10-12 DIAGNOSIS — D509 Iron deficiency anemia, unspecified: Secondary | ICD-10-CM | POA: Diagnosis not present

## 2015-10-14 DIAGNOSIS — D631 Anemia in chronic kidney disease: Secondary | ICD-10-CM | POA: Diagnosis not present

## 2015-10-14 DIAGNOSIS — E119 Type 2 diabetes mellitus without complications: Secondary | ICD-10-CM | POA: Diagnosis not present

## 2015-10-14 DIAGNOSIS — D509 Iron deficiency anemia, unspecified: Secondary | ICD-10-CM | POA: Diagnosis not present

## 2015-10-14 DIAGNOSIS — N186 End stage renal disease: Secondary | ICD-10-CM | POA: Diagnosis not present

## 2015-10-14 DIAGNOSIS — N2581 Secondary hyperparathyroidism of renal origin: Secondary | ICD-10-CM | POA: Diagnosis not present

## 2015-10-15 DIAGNOSIS — Z992 Dependence on renal dialysis: Secondary | ICD-10-CM | POA: Diagnosis not present

## 2015-10-15 DIAGNOSIS — E1129 Type 2 diabetes mellitus with other diabetic kidney complication: Secondary | ICD-10-CM | POA: Diagnosis not present

## 2015-10-15 DIAGNOSIS — N186 End stage renal disease: Secondary | ICD-10-CM | POA: Diagnosis not present

## 2015-10-17 DIAGNOSIS — Z23 Encounter for immunization: Secondary | ICD-10-CM | POA: Diagnosis not present

## 2015-10-17 DIAGNOSIS — N2581 Secondary hyperparathyroidism of renal origin: Secondary | ICD-10-CM | POA: Diagnosis not present

## 2015-10-17 DIAGNOSIS — D509 Iron deficiency anemia, unspecified: Secondary | ICD-10-CM | POA: Diagnosis not present

## 2015-10-17 DIAGNOSIS — E119 Type 2 diabetes mellitus without complications: Secondary | ICD-10-CM | POA: Diagnosis not present

## 2015-10-17 DIAGNOSIS — N186 End stage renal disease: Secondary | ICD-10-CM | POA: Diagnosis not present

## 2015-10-17 DIAGNOSIS — D631 Anemia in chronic kidney disease: Secondary | ICD-10-CM | POA: Diagnosis not present

## 2015-10-19 DIAGNOSIS — D509 Iron deficiency anemia, unspecified: Secondary | ICD-10-CM | POA: Diagnosis not present

## 2015-10-19 DIAGNOSIS — N186 End stage renal disease: Secondary | ICD-10-CM | POA: Diagnosis not present

## 2015-10-19 DIAGNOSIS — N2581 Secondary hyperparathyroidism of renal origin: Secondary | ICD-10-CM | POA: Diagnosis not present

## 2015-10-19 DIAGNOSIS — D631 Anemia in chronic kidney disease: Secondary | ICD-10-CM | POA: Diagnosis not present

## 2015-10-19 DIAGNOSIS — Z23 Encounter for immunization: Secondary | ICD-10-CM | POA: Diagnosis not present

## 2015-10-19 DIAGNOSIS — E119 Type 2 diabetes mellitus without complications: Secondary | ICD-10-CM | POA: Diagnosis not present

## 2015-10-21 ENCOUNTER — Other Ambulatory Visit: Payer: Self-pay | Admitting: *Deleted

## 2015-10-21 DIAGNOSIS — D509 Iron deficiency anemia, unspecified: Secondary | ICD-10-CM | POA: Diagnosis not present

## 2015-10-21 DIAGNOSIS — N2581 Secondary hyperparathyroidism of renal origin: Secondary | ICD-10-CM | POA: Diagnosis not present

## 2015-10-21 DIAGNOSIS — Z23 Encounter for immunization: Secondary | ICD-10-CM | POA: Diagnosis not present

## 2015-10-21 DIAGNOSIS — E119 Type 2 diabetes mellitus without complications: Secondary | ICD-10-CM | POA: Diagnosis not present

## 2015-10-21 DIAGNOSIS — D631 Anemia in chronic kidney disease: Secondary | ICD-10-CM | POA: Diagnosis not present

## 2015-10-21 DIAGNOSIS — N186 End stage renal disease: Secondary | ICD-10-CM | POA: Diagnosis not present

## 2015-10-21 NOTE — Patient Outreach (Addendum)
[facility]) Care Management  10/21/2015  Ernest [name] 25-Jul-1946 [id number]   Phone call to patient's wife to follow up on Medicaid application. Per patient's spouse, she has not heard any feedback from the Department of Social Services regarding the medicaid application submitted.    This Education officer, museum contacted the [place] to follow up on status of the Medicaid application.  Application has been scanned in, however a profile has not been created.  Voicemail message left for Salley Scarlet [phone], who scanned in the application requesting a call back to discuss status of patient's Medicaid application.   [name] [facility] Care Management [phone]

## 2015-10-24 DIAGNOSIS — D509 Iron deficiency anemia, unspecified: Secondary | ICD-10-CM | POA: Diagnosis not present

## 2015-10-24 DIAGNOSIS — D631 Anemia in chronic kidney disease: Secondary | ICD-10-CM | POA: Diagnosis not present

## 2015-10-24 DIAGNOSIS — E119 Type 2 diabetes mellitus without complications: Secondary | ICD-10-CM | POA: Diagnosis not present

## 2015-10-24 DIAGNOSIS — N2581 Secondary hyperparathyroidism of renal origin: Secondary | ICD-10-CM | POA: Diagnosis not present

## 2015-10-24 DIAGNOSIS — Z23 Encounter for immunization: Secondary | ICD-10-CM | POA: Diagnosis not present

## 2015-10-24 DIAGNOSIS — N186 End stage renal disease: Secondary | ICD-10-CM | POA: Diagnosis not present

## 2015-10-26 DIAGNOSIS — D631 Anemia in chronic kidney disease: Secondary | ICD-10-CM | POA: Diagnosis not present

## 2015-10-26 DIAGNOSIS — E119 Type 2 diabetes mellitus without complications: Secondary | ICD-10-CM | POA: Diagnosis not present

## 2015-10-26 DIAGNOSIS — N2581 Secondary hyperparathyroidism of renal origin: Secondary | ICD-10-CM | POA: Diagnosis not present

## 2015-10-26 DIAGNOSIS — N186 End stage renal disease: Secondary | ICD-10-CM | POA: Diagnosis not present

## 2015-10-26 DIAGNOSIS — Z23 Encounter for immunization: Secondary | ICD-10-CM | POA: Diagnosis not present

## 2015-10-26 DIAGNOSIS — D509 Iron deficiency anemia, unspecified: Secondary | ICD-10-CM | POA: Diagnosis not present

## 2015-10-28 DIAGNOSIS — N2581 Secondary hyperparathyroidism of renal origin: Secondary | ICD-10-CM | POA: Diagnosis not present

## 2015-10-28 DIAGNOSIS — N186 End stage renal disease: Secondary | ICD-10-CM | POA: Diagnosis not present

## 2015-10-28 DIAGNOSIS — Z23 Encounter for immunization: Secondary | ICD-10-CM | POA: Diagnosis not present

## 2015-10-28 DIAGNOSIS — E119 Type 2 diabetes mellitus without complications: Secondary | ICD-10-CM | POA: Diagnosis not present

## 2015-10-28 DIAGNOSIS — D631 Anemia in chronic kidney disease: Secondary | ICD-10-CM | POA: Diagnosis not present

## 2015-10-28 DIAGNOSIS — D509 Iron deficiency anemia, unspecified: Secondary | ICD-10-CM | POA: Diagnosis not present

## 2015-10-31 ENCOUNTER — Encounter (HOSPITAL_COMMUNITY): Payer: Self-pay | Admitting: Emergency Medicine

## 2015-10-31 ENCOUNTER — Emergency Department (HOSPITAL_COMMUNITY)
Admission: EM | Admit: 2015-10-31 | Discharge: 2015-10-31 | Disposition: A | Discharge status: Home / Self Care | Payer: Medicare Other | Attending: Emergency Medicine | Admitting: Emergency Medicine

## 2015-10-31 DIAGNOSIS — E785 Hyperlipidemia, unspecified: Secondary | ICD-10-CM | POA: Insufficient documentation

## 2015-10-31 DIAGNOSIS — Z794 Long term (current) use of insulin: Secondary | ICD-10-CM | POA: Insufficient documentation

## 2015-10-31 DIAGNOSIS — Z7902 Long term (current) use of antithrombotics/antiplatelets: Secondary | ICD-10-CM | POA: Insufficient documentation

## 2015-10-31 DIAGNOSIS — Z79899 Other long term (current) drug therapy: Secondary | ICD-10-CM | POA: Diagnosis not present

## 2015-10-31 DIAGNOSIS — I5022 Chronic systolic (congestive) heart failure: Secondary | ICD-10-CM | POA: Diagnosis not present

## 2015-10-31 DIAGNOSIS — E669 Obesity, unspecified: Secondary | ICD-10-CM | POA: Diagnosis not present

## 2015-10-31 DIAGNOSIS — Z9889 Other specified postprocedural states: Secondary | ICD-10-CM | POA: Diagnosis not present

## 2015-10-31 DIAGNOSIS — D631 Anemia in chronic kidney disease: Secondary | ICD-10-CM | POA: Diagnosis not present

## 2015-10-31 DIAGNOSIS — I2510 Atherosclerotic heart disease of native coronary artery without angina pectoris: Secondary | ICD-10-CM | POA: Insufficient documentation

## 2015-10-31 DIAGNOSIS — Z8669 Personal history of other diseases of the nervous system and sense organs: Secondary | ICD-10-CM | POA: Diagnosis not present

## 2015-10-31 DIAGNOSIS — Z87891 Personal history of nicotine dependence: Secondary | ICD-10-CM | POA: Insufficient documentation

## 2015-10-31 DIAGNOSIS — Z7982 Long term (current) use of aspirin: Secondary | ICD-10-CM | POA: Diagnosis not present

## 2015-10-31 DIAGNOSIS — Z8619 Personal history of other infectious and parasitic diseases: Secondary | ICD-10-CM | POA: Diagnosis not present

## 2015-10-31 DIAGNOSIS — Z992 Dependence on renal dialysis: Secondary | ICD-10-CM | POA: Diagnosis not present

## 2015-10-31 DIAGNOSIS — I120 Hypertensive chronic kidney disease with stage 5 chronic kidney disease or end stage renal disease: Secondary | ICD-10-CM | POA: Insufficient documentation

## 2015-10-31 DIAGNOSIS — N186 End stage renal disease: Secondary | ICD-10-CM | POA: Diagnosis not present

## 2015-10-31 DIAGNOSIS — N2581 Secondary hyperparathyroidism of renal origin: Secondary | ICD-10-CM | POA: Diagnosis not present

## 2015-10-31 DIAGNOSIS — D509 Iron deficiency anemia, unspecified: Secondary | ICD-10-CM | POA: Diagnosis not present

## 2015-10-31 DIAGNOSIS — Z8673 Personal history of transient ischemic attack (TIA), and cerebral infarction without residual deficits: Secondary | ICD-10-CM | POA: Diagnosis not present

## 2015-10-31 DIAGNOSIS — M109 Gout, unspecified: Secondary | ICD-10-CM | POA: Insufficient documentation

## 2015-10-31 DIAGNOSIS — E119 Type 2 diabetes mellitus without complications: Secondary | ICD-10-CM | POA: Insufficient documentation

## 2015-10-31 DIAGNOSIS — Z23 Encounter for immunization: Secondary | ICD-10-CM | POA: Diagnosis not present

## 2015-10-31 DIAGNOSIS — Z87438 Personal history of other diseases of male genital organs: Secondary | ICD-10-CM | POA: Insufficient documentation

## 2015-10-31 DIAGNOSIS — T829XXA Unspecified complication of cardiac and vascular prosthetic device, implant and graft, initial encounter: Secondary | ICD-10-CM | POA: Diagnosis not present

## 2015-10-31 DIAGNOSIS — R58 Hemorrhage, not elsewhere classified: Secondary | ICD-10-CM

## 2015-10-31 DIAGNOSIS — J449 Chronic obstructive pulmonary disease, unspecified: Secondary | ICD-10-CM | POA: Diagnosis not present

## 2015-10-31 DIAGNOSIS — I9762 Postprocedural hemorrhage of a circulatory system organ or structure following other procedure: Secondary | ICD-10-CM | POA: Diagnosis not present

## 2015-10-31 DIAGNOSIS — T83498A Other mechanical complication of other prosthetic devices, implants and grafts of genital tract, initial encounter: Secondary | ICD-10-CM | POA: Diagnosis not present

## 2015-10-31 DIAGNOSIS — T82898A Other specified complication of vascular prosthetic devices, implants and grafts, initial encounter: Secondary | ICD-10-CM | POA: Diagnosis not present

## 2015-10-31 MED ORDER — THROMBIN 20000 UNITS EX SOLR
20000.0000 [IU] | Freq: Once | CUTANEOUS | Status: DC
  Filled 2015-10-31: qty 20000

## 2015-10-31 NOTE — ED Notes (Signed)
Spoke with pt's wife st's she will come and pick up pt.

## 2015-10-31 NOTE — ED Notes (Signed)
EMS - Patient coming from dialysis.  When dialysis pulled the needle, unable to control bleeding.  Applied pressure for 75 minutes until EMS called.  Bounding pulse prior to EMS, applied dressing, pulse is thready.  Bleeding is controlled with EMS in route.  Denies dizziness upon standing.

## 2015-10-31 NOTE — ED Notes (Addendum)
Ernest [name] [phone]

## 2015-10-31 NOTE — ED Notes (Signed)
Dressing around the fistula graft is clean, dry and intact.

## 2015-11-01 ENCOUNTER — Encounter: Payer: Medicare Other | Admitting: Dietician

## 2015-11-01 ENCOUNTER — Telehealth: Payer: Self-pay | Admitting: Dietician

## 2015-11-01 NOTE — ED Provider Notes (Signed)
CSN: [id number]     Arrival date & time 10/31/15  1311 History   First MD Initiated Contact with Patient 10/31/15 1333     Chief Complaint  Patient presents with  . Vascular Access Problem     (Consider location/radiation/quality/duration/timing/severity/associated sxs/prior Treatment) HPI  Patient with persistent bleeding for at least an hour after dialysis needles were removed from his fistula in his right upper extremity so EMS was called. On EMS arrival they put a pressure dressing and brought him here. On initial examination here patient had no bleeding was observed for approximate hour and a half to 2 hours still without bleeding from the site without a pressure dressing. No dizziness, shows breath, chest pain or other evidence of symptomatic anemia. Has a history of having difficult to bleeding from that site. No exacerbating or relieving factors otherwise.  Past Medical History  Diagnosis Date  . Hyperlipidemia   . Retinopathy   . Gout   . ESRD (end stage renal disease) ([place])     Started HD in [place] in 2009, ESRD was due to DM. Moved to [place] in Dec 2009 and now gets dialysis at [facility] on a MWF schedule.     . Erectile dysfunction     penile implant  . Abnormal stress test     s/p cath November 2013 with modest disease involving the ostial left main, proximal LAD, proximal RCA - do not appear to be hemodynamically signficant; mild LV dysfunction  . Obesity   . Hypertension   . Hepatitis C   . COPD (chronic obstructive pulmonary disease) ([place])   . Coronary artery disease     per cath report 2013  . Ejection fraction < 50%   . Chronic systolic CHF (congestive heart failure) ([place])   . Bacteremia   . Shortness of breath     Hx: of with exertion  . Gout     Hx: of  . Stroke ([place])   . Type II or unspecified type diabetes mellitus without mention of complication, not stated as uncontrolled     adult onset   Past Surgical History  Procedure Laterality Date  . Penile  prosthesis implant      1997.. no card  . Fistula      RUE and wrist  . Cardiac catheterization  August 26, 2012  . Eye surgery      laser.  and surgery for DM  . Insertion of dialysis catheter Right 01/01/2013    Procedure: INSERTION OF DIALYSIS CATHETER right  internal jugular;  Surgeon: Serafina Mitchell, MD;  Location: [place];  Service: Vascular;  Laterality: Right;  . Av fistula placement Left 01/13/2013    Procedure: INSERTION OF ARTERIOVENOUS (AV) GORE-TEX GRAFT ARM;  Surgeon: Angelia Mould, MD;  Location: [place];  Service: Vascular;  Laterality: Left;  . Thrombectomy w/ embolectomy Left 03/26/2013    Procedure: Attempted thrombectomy of left arm arteriovenous goretex graft.;  Surgeon: Angelia Mould, MD;  Location: [place];  Service: Vascular;  Laterality: Left;  . Insertion of dialysis catheter N/A 03/26/2013    Procedure: INSERTION OF DIALYSIS CATHETER Left internal jugular vein;  Surgeon: Angelia Mould, MD;  Location: [place];  Service: Vascular;  Laterality: N/A;  . Avgg removal Left 03/26/2013    Procedure: REMOVAL OF  NON INCORPORATED ARTERIOVENOUS GORETEX GRAFT ([place]) left arm * repair of  left brachial artery with vein patch angioplasty.;  Surgeon: Angelia Mould, MD;  Location: [place];  Service: Vascular;  Laterality: Left;  . Av fistula placement Left 05/05/2013    Procedure: INSERTION OF LEFT UPPER ARM  ARTERIOVENOUS GORTEX GRAFT;  Surgeon: Christopher S Dickson, MD;  [facility];  Service: Vascular;  Laterality: Left;  . Tee without cardioversion N/A 06/11/2013    Procedure: TRANSESOPHAGEAL ECHOCARDIOGRAM (TEE);  Surgeon: Dalton S McLean, MD;  Location: [facility];  Service: Cardiovascular;  Laterality: N/A;  . Embolectomy Right 08/27/2013    Procedure: EMBOLECTOMY;  Surgeon: James D Lawson, MD;  Location: [facility];  Service: Vascular;  Laterality: Right;  Thrombectomy of Radial and ulnar artery.  . Cataract extraction w/ intraocular lens  implant,  bilateral    . Colonoscopy    . Ligation of arteriovenous  fistula Right 12/30/2013    Procedure: REMOVAL OF SEGMENT OF GORTEX GRAFT AND FISTULA  AND REPAIR OF BRACHIAL ARTERY;  Surgeon: James D Lawson, MD;  Location: [facility];  Service: Vascular;  Laterality: Right;  . Venogram N/A 04/07/2013    Procedure: VENOGRAM;  Surgeon: Vance W Brabham, MD;  [facility]: [facility];  Service: Cardiovascular;  Laterality: N/A;   Family History  Problem Relation Age of Onset  . Heart disease Father   . Diabetes Sister   . Diabetes Brother   . Diabetes Son    Social History  Substance Use Topics  . Smoking status: Former Smoker -- 7 years    Quit date: 06/10/1973  . Smokeless tobacco: Never Used     Comment: Quit in 1974.  . Alcohol Use: No    Review of Systems  Constitutional: Negative for fever, chills and activity change.  HENT: Negative for congestion and rhinorrhea.   Eyes: Negative for photophobia, pain and visual disturbance.  Respiratory: Negative for cough and shortness of breath.   Cardiovascular: Negative for chest pain.  Gastrointestinal: Negative for vomiting, abdominal pain, diarrhea and constipation.  Endocrine: Negative for polyuria.  Genitourinary: Negative for dysuria and flank pain.  Musculoskeletal: Negative for back pain and neck pain.  Skin: Negative for wound.  Neurological: Negative for headaches.  All other systems reviewed and are negative.     Allergies  Review of patient's allergies indicates no known allergies.  Home Medications   Prior to Admission medications   Medication Sig Start Date End Date Taking? Authorizing Provider  ACCU-CHEK FASTCLIX LANCETS MISC Check blood sugar 4 times a day before meals and bedtime 08/31/15  Yes Carly J Rivet, MD  allopurinol (ZYLOPRIM) 100 MG tablet Take 1 tablet (100 mg total) by mouth daily. 07/21/15  Yes Daniel J Angiulli, [name]  amitriptyline (ELAVIL) 25 MG tablet TAKE ONE TABLET BY MOUTH AT BEDTIME FOR  NEUROPATHY 07/21/15   Yes Daniel J Angiulli, [name]  aspirin EC 81 MG tablet Take 81 mg by mouth daily. 06/18/13  Yes Jeffrey D Katz, MD  atorvastatin (LIPITOR) 40 MG tablet Take 1 tablet (40 mg total) by mouth daily. 08/04/15  Yes Carly J Rivet, MD  Blood Glucose Monitoring Suppl (ACCU-CHEK AVIVA CONNECT) W/DEVICE KIT 1 each by Does not apply route QID. 08/31/15  Yes Carly J Rivet, MD  cinacalcet (SENSIPAR) 60 MG tablet Take 1 tablet (60 mg total) by mouth 2 (two) times daily. 07/21/15  Yes Daniel J Angiulli, [name]  clopidogrel (PLAVIX) 75 MG tablet Take 1 tablet (75 mg total) by mouth daily. 07/21/15  Yes Daniel J Angiulli, [name]  colchicine 0.6 MG tablet 1 per day on Monday and Friday 07/21/15  Yes Daniel J Angiulli, [name]  glucose   blood (ACCU-CHEK AVIVA PLUS) test strip Check blood sugar 4 times a day before meals and bedtime 08/31/15  Yes Carly J Rivet, MD  insulin lispro (HUMALOG) 100 UNIT/ML KiwkPen Inject 0.03 mLs (3 Units total) into the skin 3 (three) times daily with meals. 08/30/15  Yes Carly J Rivet, MD  levETIRAcetam (KEPPRA) 500 MG tablet Take 2 tablets (1,000 mg total) by mouth daily. Then take extra 500 mg (1 tablet) on MWF after HD 07/21/15  Yes Daniel J Angiulli, [name]  lisinopril (PRINIVIL,ZESTRIL) 20 MG tablet Take 1 tablet (20 mg total) by mouth at bedtime. 07/21/15  Yes Daniel J Angiulli, [name]  metoprolol tartrate (LOPRESSOR) 25 MG tablet Take 0.5 tablets (12.5 mg total) by mouth 2 (two) times daily. Patient taking differently: Take 25 mg by mouth 2 (two) times daily.  07/21/15  Yes Daniel J Angiulli, [name]  multivitamin (RENA-VIT) TABS tablet Take 1 tablet by mouth daily.    Yes Historical Provider, MD  sevelamer carbonate (RENVELA) 800 MG tablet Take 1 tablet (800 mg total) by mouth 3 (three) times daily with meals. 07/21/15  Yes Daniel J Angiulli, [name]   BP 169/81 mmHg  Pulse 69  Temp(Src) 97.6 F (36.4 C) (Oral)  Resp 18  Ht 5' 9" (1.753 m)  Wt 215 lb (97.523 kg)  BMI 31.74 kg/m2  SpO2 99% Physical  Exam  Constitutional: He appears well-developed and well-nourished.  HENT:  Head: Normocephalic and atraumatic.  Neck: Normal range of motion.  Cardiovascular: Normal rate.   Pulmonary/Chest: Effort normal. No respiratory distress.  Abdominal: He exhibits no distension.  Musculoskeletal: Normal range of motion.  Neurological: He is alert.  Skin:  Fistula to left upper extremity with some oozing out of the 2 puncture sites but no other active bleeding.  Nursing note and vitals reviewed.   ED Course  Procedures (including critical care time) Labs Review Labs Reviewed - No data to display  Imaging Review No results found. I have personally reviewed and evaluated these images and lab results as part of my medical decision-making.   EKG Interpretation None      MDM   Final diagnoses:  Bleeding    Hemostatic even after prolonged emergency department stay. No evidence of acute blood loss anemia as just tachycardia, shortness of breath, headache, vision changes or other symptoms. Stable for discharge.     , MD 11/01/15 1735 

## 2015-11-01 NOTE — Telephone Encounter (Signed)
Rescheduled his missed appointment today to 11/29/15 at 3:30 PM. Same day he sees Dr. Arcelia Jew.

## 2015-11-02 DIAGNOSIS — N2581 Secondary hyperparathyroidism of renal origin: Secondary | ICD-10-CM | POA: Diagnosis not present

## 2015-11-02 DIAGNOSIS — E119 Type 2 diabetes mellitus without complications: Secondary | ICD-10-CM | POA: Diagnosis not present

## 2015-11-02 DIAGNOSIS — Z23 Encounter for immunization: Secondary | ICD-10-CM | POA: Diagnosis not present

## 2015-11-02 DIAGNOSIS — D631 Anemia in chronic kidney disease: Secondary | ICD-10-CM | POA: Diagnosis not present

## 2015-11-02 DIAGNOSIS — R42 Dizziness and giddiness: Secondary | ICD-10-CM | POA: Diagnosis not present

## 2015-11-02 DIAGNOSIS — D509 Iron deficiency anemia, unspecified: Secondary | ICD-10-CM | POA: Diagnosis not present

## 2015-11-02 DIAGNOSIS — N186 End stage renal disease: Secondary | ICD-10-CM | POA: Diagnosis not present

## 2015-11-04 DIAGNOSIS — N186 End stage renal disease: Secondary | ICD-10-CM | POA: Diagnosis not present

## 2015-11-04 DIAGNOSIS — Z23 Encounter for immunization: Secondary | ICD-10-CM | POA: Diagnosis not present

## 2015-11-04 DIAGNOSIS — D631 Anemia in chronic kidney disease: Secondary | ICD-10-CM | POA: Diagnosis not present

## 2015-11-04 DIAGNOSIS — D509 Iron deficiency anemia, unspecified: Secondary | ICD-10-CM | POA: Diagnosis not present

## 2015-11-04 DIAGNOSIS — N2581 Secondary hyperparathyroidism of renal origin: Secondary | ICD-10-CM | POA: Diagnosis not present

## 2015-11-04 DIAGNOSIS — E119 Type 2 diabetes mellitus without complications: Secondary | ICD-10-CM | POA: Diagnosis not present

## 2015-11-07 DIAGNOSIS — D631 Anemia in chronic kidney disease: Secondary | ICD-10-CM | POA: Diagnosis not present

## 2015-11-07 DIAGNOSIS — D509 Iron deficiency anemia, unspecified: Secondary | ICD-10-CM | POA: Diagnosis not present

## 2015-11-07 DIAGNOSIS — E119 Type 2 diabetes mellitus without complications: Secondary | ICD-10-CM | POA: Diagnosis not present

## 2015-11-07 DIAGNOSIS — N186 End stage renal disease: Secondary | ICD-10-CM | POA: Diagnosis not present

## 2015-11-07 DIAGNOSIS — N2581 Secondary hyperparathyroidism of renal origin: Secondary | ICD-10-CM | POA: Diagnosis not present

## 2015-11-07 DIAGNOSIS — Z23 Encounter for immunization: Secondary | ICD-10-CM | POA: Diagnosis not present

## 2015-11-08 ENCOUNTER — Ambulatory Visit (INDEPENDENT_AMBULATORY_CARE_PROVIDER_SITE_OTHER): Payer: Medicare Other | Admitting: Podiatry

## 2015-11-08 ENCOUNTER — Encounter: Payer: Self-pay | Admitting: Podiatry

## 2015-11-08 DIAGNOSIS — M79673 Pain in unspecified foot: Secondary | ICD-10-CM | POA: Diagnosis not present

## 2015-11-08 DIAGNOSIS — Z794 Long term (current) use of insulin: Secondary | ICD-10-CM | POA: Diagnosis not present

## 2015-11-08 DIAGNOSIS — B351 Tinea unguium: Secondary | ICD-10-CM

## 2015-11-08 DIAGNOSIS — E119 Type 2 diabetes mellitus without complications: Secondary | ICD-10-CM | POA: Diagnosis not present

## 2015-11-08 NOTE — Progress Notes (Signed)
Patient ID: Ernest [name], male   DOB: Mar 20, 1946, 70 y.o.   MRN: [id number] Complaint:  Visit Type: Patient returns to my office for continued preventative foot care services. Complaint: Patient states" my nails have grown long and thick and become painful to walk and wear shoes" Patient has been diagnosed with DM with no foot  complications. He presents for preventative foot care services. No changes to ROS  Podiatric Exam: Vascular: dorsalis pedis and posterior tibial pulses are palpable bilateral. Capillary return is immediate. Temperature gradient is WNL. Skin turgor WNL  Sensorium: Normal Semmes Weinstein monofilament test. Normal tactile sensation bilaterally. Nail Exam: Pt has thick disfigured discolored nails with subungual debris noted bilateral entire nail hallux through fifth toenails Ulcer Exam: There is no evidence of ulcer or pre-ulcerative changes or infection. Orthopedic Exam: Muscle tone and strength are WNL. No limitations in general ROM. No crepitus or effusions noted. Foot type and digits show no abnormalities. Bony prominences are unremarkable. Skin: No Porokeratosis. No infection or ulcers  Diagnosis:  Tinea unguium, Pain in right toe, pain in left toes  Treatment & Plan Procedures and Treatment: Consent by patient was obtained for treatment procedures. The patient understood the discussion of treatment and procedures well. All questions were answered thoroughly reviewed. Debridement of mycotic and hypertrophic toenails, 1 through 5 bilateral and clearing of subungual debris. No ulceration, no infection noted.  Return Visit-Office Procedure: Patient instructed to return to the office for a follow up visit 3 months for continued evaluation and treatment.   Gardiner Barefoot DPM

## 2015-11-09 DIAGNOSIS — N186 End stage renal disease: Secondary | ICD-10-CM | POA: Diagnosis not present

## 2015-11-09 DIAGNOSIS — D509 Iron deficiency anemia, unspecified: Secondary | ICD-10-CM | POA: Diagnosis not present

## 2015-11-09 DIAGNOSIS — Z23 Encounter for immunization: Secondary | ICD-10-CM | POA: Diagnosis not present

## 2015-11-09 DIAGNOSIS — E119 Type 2 diabetes mellitus without complications: Secondary | ICD-10-CM | POA: Diagnosis not present

## 2015-11-09 DIAGNOSIS — N2581 Secondary hyperparathyroidism of renal origin: Secondary | ICD-10-CM | POA: Diagnosis not present

## 2015-11-09 DIAGNOSIS — E1129 Type 2 diabetes mellitus with other diabetic kidney complication: Secondary | ICD-10-CM | POA: Diagnosis not present

## 2015-11-09 DIAGNOSIS — D631 Anemia in chronic kidney disease: Secondary | ICD-10-CM | POA: Diagnosis not present

## 2015-11-10 ENCOUNTER — Other Ambulatory Visit: Payer: Self-pay | Admitting: *Deleted

## 2015-11-10 NOTE — Patient Outreach (Signed)
[facility]) Care Management  11/10/2015  Ernest [name] 1946-04-28 [id number]   Phone call to patient and patient's spouse to follow up on Medicaid application.  Per patient's spouse, she received a letter from the Department of Social Services regarding patient's Medicaid, however did not understand what was needed.    Plan:  Home visit scheduled for 11/10/14 at 9am to continue assistance with Medicaid application.   [name] [facility] Care Management [phone]

## 2015-11-11 ENCOUNTER — Ambulatory Visit: Payer: Medicare Other | Admitting: *Deleted

## 2015-11-11 DIAGNOSIS — E119 Type 2 diabetes mellitus without complications: Secondary | ICD-10-CM | POA: Diagnosis not present

## 2015-11-11 DIAGNOSIS — Z23 Encounter for immunization: Secondary | ICD-10-CM | POA: Diagnosis not present

## 2015-11-11 DIAGNOSIS — N186 End stage renal disease: Secondary | ICD-10-CM | POA: Diagnosis not present

## 2015-11-11 DIAGNOSIS — N2581 Secondary hyperparathyroidism of renal origin: Secondary | ICD-10-CM | POA: Diagnosis not present

## 2015-11-11 DIAGNOSIS — D631 Anemia in chronic kidney disease: Secondary | ICD-10-CM | POA: Diagnosis not present

## 2015-11-11 DIAGNOSIS — D509 Iron deficiency anemia, unspecified: Secondary | ICD-10-CM | POA: Diagnosis not present

## 2015-11-14 ENCOUNTER — Other Ambulatory Visit: Payer: Self-pay | Admitting: *Deleted

## 2015-11-14 DIAGNOSIS — E119 Type 2 diabetes mellitus without complications: Secondary | ICD-10-CM | POA: Diagnosis not present

## 2015-11-14 DIAGNOSIS — Z23 Encounter for immunization: Secondary | ICD-10-CM | POA: Diagnosis not present

## 2015-11-14 DIAGNOSIS — D509 Iron deficiency anemia, unspecified: Secondary | ICD-10-CM | POA: Diagnosis not present

## 2015-11-14 DIAGNOSIS — N186 End stage renal disease: Secondary | ICD-10-CM | POA: Diagnosis not present

## 2015-11-14 DIAGNOSIS — D631 Anemia in chronic kidney disease: Secondary | ICD-10-CM | POA: Diagnosis not present

## 2015-11-14 DIAGNOSIS — N2581 Secondary hyperparathyroidism of renal origin: Secondary | ICD-10-CM | POA: Diagnosis not present

## 2015-11-14 NOTE — Patient Outreach (Signed)
[facility]) Care Management  11/14/2015  EZRAH KARNEY 09-Jun-1946 [id number]  Phone call from patient's spouse on 11/10/14 requesting that the scheduled home visit be re-scheduled to 11/21/15 at 2:00pm to continue to assist with medicaid application process.   Plan:  Home visit scheduled for 11/11/15 at 2:00pm.   [name] [facility] Care Management [phone]

## 2015-11-15 DIAGNOSIS — Z992 Dependence on renal dialysis: Secondary | ICD-10-CM | POA: Diagnosis not present

## 2015-11-15 DIAGNOSIS — N186 End stage renal disease: Secondary | ICD-10-CM | POA: Diagnosis not present

## 2015-11-15 DIAGNOSIS — E1129 Type 2 diabetes mellitus with other diabetic kidney complication: Secondary | ICD-10-CM | POA: Diagnosis not present

## 2015-11-15 DIAGNOSIS — T82858D Stenosis of vascular prosthetic devices, implants and grafts, subsequent encounter: Secondary | ICD-10-CM | POA: Diagnosis not present

## 2015-11-15 DIAGNOSIS — I871 Compression of vein: Secondary | ICD-10-CM | POA: Diagnosis not present

## 2015-11-16 DIAGNOSIS — N2581 Secondary hyperparathyroidism of renal origin: Secondary | ICD-10-CM | POA: Diagnosis not present

## 2015-11-16 DIAGNOSIS — E119 Type 2 diabetes mellitus without complications: Secondary | ICD-10-CM | POA: Diagnosis not present

## 2015-11-16 DIAGNOSIS — D631 Anemia in chronic kidney disease: Secondary | ICD-10-CM | POA: Diagnosis not present

## 2015-11-16 DIAGNOSIS — N186 End stage renal disease: Secondary | ICD-10-CM | POA: Diagnosis not present

## 2015-11-16 DIAGNOSIS — D509 Iron deficiency anemia, unspecified: Secondary | ICD-10-CM | POA: Diagnosis not present

## 2015-11-17 ENCOUNTER — Ambulatory Visit: Payer: Self-pay | Admitting: *Deleted

## 2015-11-18 DIAGNOSIS — D509 Iron deficiency anemia, unspecified: Secondary | ICD-10-CM | POA: Diagnosis not present

## 2015-11-18 DIAGNOSIS — N186 End stage renal disease: Secondary | ICD-10-CM | POA: Diagnosis not present

## 2015-11-18 DIAGNOSIS — D631 Anemia in chronic kidney disease: Secondary | ICD-10-CM | POA: Diagnosis not present

## 2015-11-18 DIAGNOSIS — N2581 Secondary hyperparathyroidism of renal origin: Secondary | ICD-10-CM | POA: Diagnosis not present

## 2015-11-18 DIAGNOSIS — E119 Type 2 diabetes mellitus without complications: Secondary | ICD-10-CM | POA: Diagnosis not present

## 2015-11-21 DIAGNOSIS — N186 End stage renal disease: Secondary | ICD-10-CM | POA: Diagnosis not present

## 2015-11-21 DIAGNOSIS — N2581 Secondary hyperparathyroidism of renal origin: Secondary | ICD-10-CM | POA: Diagnosis not present

## 2015-11-21 DIAGNOSIS — E119 Type 2 diabetes mellitus without complications: Secondary | ICD-10-CM | POA: Diagnosis not present

## 2015-11-21 DIAGNOSIS — D631 Anemia in chronic kidney disease: Secondary | ICD-10-CM | POA: Diagnosis not present

## 2015-11-21 DIAGNOSIS — D509 Iron deficiency anemia, unspecified: Secondary | ICD-10-CM | POA: Diagnosis not present

## 2015-11-22 ENCOUNTER — Other Ambulatory Visit: Payer: Self-pay | Admitting: *Deleted

## 2015-11-22 NOTE — Patient Outreach (Signed)
[name]) Care Management  [facility] Social Work  11/22/2015  Ernest [name] [birth year] [id number]  Subjective:  Patient is a 70 year old male.  Objective:   Current Medications:  Current Outpatient Prescriptions  Medication Sig Dispense Refill  . ACCU-CHEK FASTCLIX LANCETS MISC Check blood sugar 4 times a day before meals and bedtime 102 each 5  . allopurinol (ZYLOPRIM) 100 MG tablet Take 1 tablet (100 mg total) by mouth daily. 30 tablet 1  . amitriptyline (ELAVIL) 25 MG tablet TAKE ONE TABLET BY MOUTH AT BEDTIME FOR  NEUROPATHY 90 tablet 0  . aspirin EC 81 MG tablet Take 81 mg by mouth daily.    [name] atorvastatin (LIPITOR) 40 MG tablet Take 1 tablet (40 mg total) by mouth daily. 30 tablet 3  . Blood Glucose Monitoring Suppl (ACCU-CHEK AVIVA CONNECT) W/DEVICE KIT 1 each by Does not apply route QID. 1 kit 0  . cinacalcet (SENSIPAR) 60 MG tablet Take 1 tablet (60 mg total) by mouth 2 (two) times daily. 60 tablet 0  . clopidogrel (PLAVIX) 75 MG tablet Take 1 tablet (75 mg total) by mouth daily. 30 tablet 1  . colchicine 0.6 MG tablet 1 per day on Monday and Friday 30 tablet 0  . glucose blood (ACCU-CHEK AVIVA PLUS) test strip Check blood sugar 4 times a day before meals and bedtime 125 each 5  . insulin lispro (HUMALOG) 100 UNIT/ML KiwkPen Inject 0.03 mLs (3 Units total) into the skin 3 (three) times daily with meals. 15 mL 3  . levETIRAcetam (KEPPRA) 500 MG tablet Take 2 tablets (1,000 mg total) by mouth daily. Then take extra 500 mg (1 tablet) on MWF after HD 72 tablet 5  . lisinopril (PRINIVIL,ZESTRIL) 20 MG tablet Take 1 tablet (20 mg total) by mouth at bedtime. 30 tablet 1  . metoprolol tartrate (LOPRESSOR) 25 MG tablet Take 0.5 tablets (12.5 mg total) by mouth 2 (two) times daily. (Patient taking differently: Take 25 mg by mouth 2 (two) times daily. ) 60 tablet 1  . multivitamin (RENA-VIT) TABS tablet Take 1 tablet by mouth daily.     . sevelamer carbonate (RENVELA) 800  MG tablet Take 1 tablet (800 mg total) by mouth 3 (three) times daily with meals. 90 tablet 0   No current facility-administered medications for this visit.    Functional Status:  In your present state of health, do you have any difficulty performing the following activities: 08/30/2015 08/11/2015  Hearing? Tempie Donning  Vision? N Y  Difficulty concentrating or making decisions? N Y  Walking or climbing stairs? Y Y  Dressing or bathing? N N  Doing errands, shopping? Y Y  Conservation officer, nature and eating ? - N  Using the Toilet? - N  In the past six months, have you accidently leaked urine? - N  Do you have problems with loss of bowel control? - N  Managing your Medications? - N  Managing your Finances? - Y  Housekeeping or managing your Housekeeping? - Y    Fall/Depression Screening:  PHQ 2/9 Scores 11/22/2015 08/30/2015 08/11/2015 08/04/2015 08/04/2015 07/28/2013 07/06/2013  PHQ - 2 Score 0 0 0 1 1 0 0    Assessment:  Completed home visit with patient and his spouse to continue to assist with Medicaid applicatoin.  Patient received paperwork , identifying the required documents needed to complete the Medicaid application. Proof of income, life insurance policies, bank account numbers or statements all needed.  Case ID number [id number]  Ernest [name] [phone] is the assigned case worker. Paper work was  due on 11/07/15.   This social worker called the Medicaid worker to request an extension, leaving voicemail message.  Needed paperwork discussed.  Per patient's spouse, she will mail the paperwork to the [facility] office today.  Patient continues to go to Dialysis on  Monday Wednesday and Fridays. Per patient's spouse, she continues to utilize the local  food pantry and will be visiting there today.  Plan: This social worker will follow up with patient by phone on 12/06/15 to follow up on submission of needed documents to complete the Medicaid application

## 2015-11-23 DIAGNOSIS — D509 Iron deficiency anemia, unspecified: Secondary | ICD-10-CM | POA: Diagnosis not present

## 2015-11-23 DIAGNOSIS — D631 Anemia in chronic kidney disease: Secondary | ICD-10-CM | POA: Diagnosis not present

## 2015-11-23 DIAGNOSIS — N186 End stage renal disease: Secondary | ICD-10-CM | POA: Diagnosis not present

## 2015-11-23 DIAGNOSIS — N2581 Secondary hyperparathyroidism of renal origin: Secondary | ICD-10-CM | POA: Diagnosis not present

## 2015-11-23 DIAGNOSIS — E119 Type 2 diabetes mellitus without complications: Secondary | ICD-10-CM | POA: Diagnosis not present

## 2015-11-24 ENCOUNTER — Encounter: Payer: Self-pay | Admitting: Internal Medicine

## 2015-11-25 DIAGNOSIS — D631 Anemia in chronic kidney disease: Secondary | ICD-10-CM | POA: Diagnosis not present

## 2015-11-25 DIAGNOSIS — N2581 Secondary hyperparathyroidism of renal origin: Secondary | ICD-10-CM | POA: Diagnosis not present

## 2015-11-25 DIAGNOSIS — E119 Type 2 diabetes mellitus without complications: Secondary | ICD-10-CM | POA: Diagnosis not present

## 2015-11-25 DIAGNOSIS — N186 End stage renal disease: Secondary | ICD-10-CM | POA: Diagnosis not present

## 2015-11-25 DIAGNOSIS — D509 Iron deficiency anemia, unspecified: Secondary | ICD-10-CM | POA: Diagnosis not present

## 2015-11-28 DIAGNOSIS — D509 Iron deficiency anemia, unspecified: Secondary | ICD-10-CM | POA: Diagnosis not present

## 2015-11-28 DIAGNOSIS — N186 End stage renal disease: Secondary | ICD-10-CM | POA: Diagnosis not present

## 2015-11-28 DIAGNOSIS — N2581 Secondary hyperparathyroidism of renal origin: Secondary | ICD-10-CM | POA: Diagnosis not present

## 2015-11-28 DIAGNOSIS — E119 Type 2 diabetes mellitus without complications: Secondary | ICD-10-CM | POA: Diagnosis not present

## 2015-11-28 DIAGNOSIS — D631 Anemia in chronic kidney disease: Secondary | ICD-10-CM | POA: Diagnosis not present

## 2015-11-29 ENCOUNTER — Ambulatory Visit (INDEPENDENT_AMBULATORY_CARE_PROVIDER_SITE_OTHER): Payer: Medicare Other | Admitting: Internal Medicine

## 2015-11-29 ENCOUNTER — Ambulatory Visit (INDEPENDENT_AMBULATORY_CARE_PROVIDER_SITE_OTHER): Payer: Medicare Other | Admitting: Dietician

## 2015-11-29 VITALS — BP 160/72 | HR 59 | Temp 98.0°F | Wt 220.2 lb

## 2015-11-29 DIAGNOSIS — I2510 Atherosclerotic heart disease of native coronary artery without angina pectoris: Secondary | ICD-10-CM | POA: Diagnosis not present

## 2015-11-29 DIAGNOSIS — N186 End stage renal disease: Principal | ICD-10-CM

## 2015-11-29 DIAGNOSIS — M109 Gout, unspecified: Secondary | ICD-10-CM | POA: Diagnosis not present

## 2015-11-29 DIAGNOSIS — E669 Obesity, unspecified: Secondary | ICD-10-CM

## 2015-11-29 DIAGNOSIS — Z7982 Long term (current) use of aspirin: Secondary | ICD-10-CM | POA: Diagnosis not present

## 2015-11-29 DIAGNOSIS — Z713 Dietary counseling and surveillance: Secondary | ICD-10-CM

## 2015-11-29 DIAGNOSIS — Z794 Long term (current) use of insulin: Secondary | ICD-10-CM | POA: Diagnosis not present

## 2015-11-29 DIAGNOSIS — G4733 Obstructive sleep apnea (adult) (pediatric): Secondary | ICD-10-CM

## 2015-11-29 DIAGNOSIS — Z79899 Other long term (current) drug therapy: Secondary | ICD-10-CM | POA: Diagnosis not present

## 2015-11-29 DIAGNOSIS — E1122 Type 2 diabetes mellitus with diabetic chronic kidney disease: Secondary | ICD-10-CM | POA: Diagnosis not present

## 2015-11-29 DIAGNOSIS — E1165 Type 2 diabetes mellitus with hyperglycemia: Secondary | ICD-10-CM | POA: Diagnosis present

## 2015-11-29 DIAGNOSIS — R569 Unspecified convulsions: Secondary | ICD-10-CM | POA: Diagnosis not present

## 2015-11-29 DIAGNOSIS — E1169 Type 2 diabetes mellitus with other specified complication: Secondary | ICD-10-CM

## 2015-11-29 DIAGNOSIS — IMO0002 Reserved for concepts with insufficient information to code with codable children: Secondary | ICD-10-CM

## 2015-11-29 DIAGNOSIS — E784 Other hyperlipidemia: Secondary | ICD-10-CM

## 2015-11-29 DIAGNOSIS — E785 Hyperlipidemia, unspecified: Secondary | ICD-10-CM

## 2015-11-29 DIAGNOSIS — Z6832 Body mass index (BMI) 32.0-32.9, adult: Secondary | ICD-10-CM

## 2015-11-29 DIAGNOSIS — Z992 Dependence on renal dialysis: Secondary | ICD-10-CM | POA: Diagnosis not present

## 2015-11-29 DIAGNOSIS — I69398 Other sequelae of cerebral infarction: Secondary | ICD-10-CM

## 2015-11-29 DIAGNOSIS — I120 Hypertensive chronic kidney disease with stage 5 chronic kidney disease or end stage renal disease: Secondary | ICD-10-CM

## 2015-11-29 LAB — GLUCOSE, CAPILLARY: Glucose-Capillary: 155 mg/dL — ABNORMAL HIGH (ref 65–99)

## 2015-11-29 LAB — POCT GLYCOSYLATED HEMOGLOBIN (HGB A1C): Hemoglobin A1C: 7.3

## 2015-11-29 MED ORDER — CLOPIDOGREL BISULFATE 75 MG PO TABS: 75.0000 mg | ORAL_TABLET | Freq: Every day | ORAL | Status: DC

## 2015-11-29 MED ORDER — SEVELAMER CARBONATE 800 MG PO TABS: 800.0000 mg | ORAL_TABLET | Freq: Three times a day (TID) | ORAL | Status: DC

## 2015-11-29 MED ORDER — ACCU-CHEK FASTCLIX LANCETS MISC: Status: DC

## 2015-11-29 MED ORDER — RENA-VITE PO TABS: 1.0000 | ORAL_TABLET | Freq: Every day | ORAL | Status: DC

## 2015-11-29 MED ORDER — AMITRIPTYLINE HCL 25 MG PO TABS: ORAL_TABLET | ORAL | Status: DC

## 2015-11-29 MED ORDER — COLCHICINE 0.6 MG PO TABS: ORAL_TABLET | ORAL | Status: DC

## 2015-11-29 MED ORDER — ASPIRIN EC 81 MG PO TBEC: 81.0000 mg | DELAYED_RELEASE_TABLET | Freq: Every day | ORAL | Status: DC

## 2015-11-29 MED ORDER — INSULIN GLARGINE 100 UNIT/ML ~~LOC~~ SOLN: 10.0000 [IU] | Freq: Every day | SUBCUTANEOUS | Status: DC

## 2015-11-29 MED ORDER — LEVETIRACETAM 500 MG PO TABS: 1000.0000 mg | ORAL_TABLET | Freq: Every day | ORAL | Status: DC

## 2015-11-29 MED ORDER — INSULIN PEN NEEDLE 32G X 5 MM MISC: Status: DC

## 2015-11-29 MED ORDER — ATORVASTATIN CALCIUM 40 MG PO TABS: 40.0000 mg | ORAL_TABLET | Freq: Every day | ORAL | Status: DC

## 2015-11-29 MED ORDER — ALLOPURINOL 100 MG PO TABS: 100.0000 mg | ORAL_TABLET | Freq: Every day | ORAL | Status: DC

## 2015-11-29 MED ORDER — INSULIN LISPRO 100 UNIT/ML (KWIKPEN): 3.0000 [IU] | PEN_INJECTOR | Freq: Three times a day (TID) | SUBCUTANEOUS | Status: DC

## 2015-11-29 MED ORDER — GLUCOSE BLOOD VI STRP: ORAL_STRIP | Status: DC

## 2015-11-29 MED ORDER — METOPROLOL TARTRATE 25 MG PO TABS: 25.0000 mg | ORAL_TABLET | Freq: Two times a day (BID) | ORAL | Status: DC

## 2015-11-29 MED ORDER — CINACALCET HCL 60 MG PO TABS: 60.0000 mg | ORAL_TABLET | Freq: Two times a day (BID) | ORAL | Status: DC

## 2015-11-29 MED ORDER — LISINOPRIL 20 MG PO TABS: 20.0000 mg | ORAL_TABLET | Freq: Every day | ORAL | Status: DC

## 2015-11-29 NOTE — Patient Instructions (Signed)
PLEASE MAKE SURE YOU ARE GIVING THE CORRECT INSULIN! - Lantus (purple pen) should be 10 units at bedtime - Humalog (blue pen) should be 3 units three times daily with meals - I will refill all of your medications - Please make an appointment with Dr. Zadie Rhine to have your eyes checked - Restart Lisinopril for blood pressure - Follow up in 1 month to recheck blood sugars and blood pressure  General Instructions:   Thank you for bringing your medicines today. This helps [place] keep you safe from mistakes.   Progress Toward Treatment Goals:  Treatment Goal 08/30/2015  Hemoglobin A1C unchanged  Blood pressure at goal    Self Care Goals & Plans:  Self Care Goal 08/30/2015  Manage my medications take my medicines as prescribed; bring my medications to every visit; refill my medications on time  Monitor my health keep track of my blood glucose; bring my glucose meter and log to each visit; keep track of my blood pressure; check my feet daily  Eat healthy foods eat more vegetables; eat foods that are low in salt; eat baked foods instead of fried foods  Be physically active find an activity I enjoy    Home Blood Glucose Monitoring 08/30/2015  Check my blood sugar 3 times a day  When to check my blood sugar before meals; at bedtime     Care Management & Community Referrals:  Referral 08/30/2015  Referrals made for care management support nutritionist; diabetes educator

## 2015-11-29 NOTE — Progress Notes (Signed)
  Medical Nutrition Therapy:  Appt start time: 1600 end time:  1650. Visit # 1  Assessment:  Primary concerns today: blood sugar control/weight loss.  Patient is here with his wife. He was diagnosed with diabetes in 1985. She says she used to do their cooking and shopping but now her daughter does both. Their daughter cooks a lot of baked chicken which they are tired of. They have [employer] twice a month, soda one time a month.  Preferred Learning Style:No preference indicated  Learning Readiness: Contemplating/ready and in some ways change is in  progress  ANTHROPOMETRICS: weight-220#, height- 69", BMI- 32- obese WEIGHT HISTORY:lost from 250s to 220 in past 2-3 year after a stroke, he says they still want him to lose more.  SLEEP: broken, goes to sleep sometime early evening, wife wakes him for bedtime medicine,  may wake up again about midnight, then about 4-5 am, naps after dialysis MEDICATIONS: lantus 10 units nightly, 3 units Humalog before meals ,Takes insulin 430 am eats on dialysis days 7ish LABS:  BOWELS: move daily and are mostly normally formed, sometimes wakes to uncontrollable diarrhea BLOOD SUGAR:meter download noted- fasting 131-175, most checks after 5 PM and average is 206, no hypoglycemia DIETARY INTAKE: Usual eating pattern includes 3 meals and 2-3 snacks per day. Everyday foods include sandwiches, water, coffee.  Avoided foods include milk bothers his stomach.   24-hr recall:  B ( AM): oatmeal, 2 eggs, coffee with powdered cream, or sandwich, one time a month he has breakfast biscuit with eggs and sauage Snk ( AM): pack of crackers, water or coffee L ( PM):sandwich [place] or [place] bologna and cheese, water , one time a month fish fillet at [employer] with water or punch Snk ( PM): crackers, sandwich , soup D ( PM): 3 chicken thighs or drumsticks, vegetables rice , water Snk ( PM): walnuts, peanut butter crackers, dried cherries Beverages: water, juice, little soda once time a  month  Usual physical activity: tries to walk with his walker to the corner, his balance is off since his stroke  Estimated energy needs: 1600-1800 calories for weight loss ~ 180 g carbohydrates (12 servings.day) likely has 2,1,2,1, 4,2 70-80g protein   Progress Towards Goal(s):  In progress.   Nutritional Diagnosis:  [place]-2.2 Altered nutrition-related laboratory As related to insulin and food not being coordinated, synched.  As evidenced by his report and high A1C..     Intervention:  Nutrition education about need for meal time insulin to be given within 15 minutes of meal, ways to increase their nutrient density, assisting with slow gradual weight loss while maintaining muscle mass and strength along with controlled blood sugars Coordination of care: Needs more pen needles, consider increasing dinner dose of humalog by 1 units, vs trying [place]  Was keeping pen needle on insulin pen   Teaching Method Utilized: Visual, Auditory, Hands on Handouts given during visit include: Barriers to learning/adherence to lifestyle change: competing cvalues Demonstrated degree of understanding via:  Teach Back   Monitoring/Evaluation:  Dietary intake, exercise, meter, and body weight in 2 month(s).

## 2015-11-29 NOTE — Patient Instructions (Addendum)
Keep breakfast and breakfast insulin togehter  Try to rember not to leave the pen needle on the insulin after injections.   It is okay to have fresh pork- like pork roast or pork chops or pork loins one time a week. Beef 1-2 times a week Fish 3 times  A week Chicken and [place] the other meals.   Meatloaf with low fat ground beef is okay then can use leftovers for sandwiches.  Meatless is okay too!  TOFU  I'd like you to follow up with a dietitian in 2- 3 months, you  can go to the dialysis dieitian

## 2015-11-30 ENCOUNTER — Encounter: Payer: Self-pay | Admitting: Internal Medicine

## 2015-11-30 DIAGNOSIS — N186 End stage renal disease: Secondary | ICD-10-CM | POA: Diagnosis not present

## 2015-11-30 DIAGNOSIS — D509 Iron deficiency anemia, unspecified: Secondary | ICD-10-CM | POA: Diagnosis not present

## 2015-11-30 DIAGNOSIS — D631 Anemia in chronic kidney disease: Secondary | ICD-10-CM | POA: Diagnosis not present

## 2015-11-30 DIAGNOSIS — E119 Type 2 diabetes mellitus without complications: Secondary | ICD-10-CM | POA: Diagnosis not present

## 2015-11-30 DIAGNOSIS — N2581 Secondary hyperparathyroidism of renal origin: Secondary | ICD-10-CM | POA: Diagnosis not present

## 2015-11-30 NOTE — Progress Notes (Signed)
   Subjective:    Patient ID: Ernest [name], male    DOB: 1946-07-24, 70 y.o.   MRN: [id number]  HPI Ernest [name] is a 70yo man with PMHx of HTN, type 2 DM, ESRD, CAD, chronic combined CHF, and CVA who presents today for follow up of his HTN.  HTN: BP mildly elevated at 160/72 today. He reports he ran out of his Lisinopril a few weeks ago. He has only been taking Lopressor 25 mg BID.   Type 2 DM: Last A1c 7.7, today is 7.3. He brought his glucometer today and most blood sugars in 120-200 range. However, after reviewing medications his wife realized that she had mixed up the doses for the Lantus and Humalog pens. She had been giving him Lantus 3 units TID and Humalog 10 units QHS. He denies any hypoglycemia.   CAD: He denies any chest pain at rest or on exertion and SOB. He has been taking his aspirin and Lopressor. He reports he ran out of his Lipitor so has not taken that for several weeks.   ESRD: Reports he had a vascular access issue last month with persistent bleeding. He has a RUE AV fistula. He states he followed up with his vascular surgeon and had surgery last week. He denies any issues with dialysis. He is on a MWF schedule.   HLD: Reports he ran out of his Lipitor so has not taken this for several weeks.   OSA?: He denies any hx of OSA. He states he was supposed to go for a sleep study but did not want to do this test. His wife reports he snores occasionally, but usually only when he has a stuffy nose or cold. He denies daytime sleepiness or waking up in the middle of the night feeling SOB.   Gout: Reports his gout is well controlled on colchicine 0.6 mg on Monday and Friday and Allopurinol 100 mg daily. Denies any recent flares.   Hx Seizure: Denies any recent seizures. He is on Keppra 1000 mg daily and an extra 500 mg on MWF after HD.    Review of Systems General: Denies fever, chills, night sweats, changes in weight, changes in appetite HEENT: Denies headaches, ear pain,  changes in vision, rhinorrhea, sore throat CV: Denies CP, palpitations, SOB, orthopnea Pulm: Denies SOB, cough, wheezing GI: Denies abdominal pain, nausea, vomiting, diarrhea, constipation, melena, hematochezia GU: Denies dysuria, hematuria, frequency Msk: Denies muscle cramps, joint pains Neuro: Denies weakness, numbness, tingling Skin: Denies rashes, bruising Psych: Denies depression, anxiety, hallucinations    Objective:   Physical Exam General: alert, sitting up, NAD, pleasant HEENT: [place]/AT, EOMI, sclera anicteric, mucus membranes moist CV: RRR, no m/g/r Pulm: CTA bilaterally, breaths non-labored Abd: BS+, soft, obese, non-tender Ext: warm, no peripheral edema, RUE fistula with palpable thrill Neuro: alert and oriented x 3, speech mildly dysarthric from prior stroke, strength 5/5 in upper and lower extremities    Assessment & Plan:  Please refer to A&P documentation.

## 2015-11-30 NOTE — Assessment & Plan Note (Signed)
Denies any anginal symptoms. Continue to monitor. - Continue beta blocker, ASA, and statin - Refills provided for all meds

## 2015-11-30 NOTE — Assessment & Plan Note (Signed)
Quite possible he does have OSA given his body habitus, but he does not have a sleep study to confirm. I discussed getting a sleep study with the patient and his wife, explaining that if he did have untreated OSA this could lead to pulmonary HTN which can place extra stress on his heart. The patient states he understands the risks but does not wish to have this study.

## 2015-11-30 NOTE — Assessment & Plan Note (Signed)
Refilled his statin, Lipitor 40 mg daily.

## 2015-11-30 NOTE — Assessment & Plan Note (Signed)
He had vascular access issues last month but this has now been resolved. He follows with Dr. Moshe Cipro with [facility].

## 2015-11-30 NOTE — Assessment & Plan Note (Signed)
Well controlled. Refilled his meds.

## 2015-11-30 NOTE — Assessment & Plan Note (Signed)
His A1c has actually improved from 7.7 to 7.3 despite taking the wrong regimen. We clarified how each insulin should be taken. Will continue current regimen given cannot analyze blood sugars while he was on incorrect insulin dosing.  - Continue Lantus 10 units QHS - Continue Humalog 3 units TID with meals - Reminded to get eye exam in April - Follow up in 3 months

## 2015-11-30 NOTE — Assessment & Plan Note (Signed)
BP 160/72 today. He has been off of his Lisinopril. Recommended for him to restart this as soon as possible. - Continue Lopressor 25 mg BID and Lisinopril 40 mg daily

## 2015-11-30 NOTE — Assessment & Plan Note (Signed)
Stable. Refilled his meds.

## 2015-12-01 NOTE — Progress Notes (Signed)
Internal Medicine Clinic Attending  Case discussed with Dr. Rivet soon after the resident saw the patient.  We reviewed the resident's history and exam and pertinent patient test results.  I agree with the assessment, diagnosis, and plan of care documented in the resident's note.  

## 2015-12-02 DIAGNOSIS — N2581 Secondary hyperparathyroidism of renal origin: Secondary | ICD-10-CM | POA: Diagnosis not present

## 2015-12-02 DIAGNOSIS — N186 End stage renal disease: Secondary | ICD-10-CM | POA: Diagnosis not present

## 2015-12-02 DIAGNOSIS — E119 Type 2 diabetes mellitus without complications: Secondary | ICD-10-CM | POA: Diagnosis not present

## 2015-12-02 DIAGNOSIS — D509 Iron deficiency anemia, unspecified: Secondary | ICD-10-CM | POA: Diagnosis not present

## 2015-12-02 DIAGNOSIS — D631 Anemia in chronic kidney disease: Secondary | ICD-10-CM | POA: Diagnosis not present

## 2015-12-05 DIAGNOSIS — N2581 Secondary hyperparathyroidism of renal origin: Secondary | ICD-10-CM | POA: Diagnosis not present

## 2015-12-05 DIAGNOSIS — D631 Anemia in chronic kidney disease: Secondary | ICD-10-CM | POA: Diagnosis not present

## 2015-12-05 DIAGNOSIS — N186 End stage renal disease: Secondary | ICD-10-CM | POA: Diagnosis not present

## 2015-12-05 DIAGNOSIS — D509 Iron deficiency anemia, unspecified: Secondary | ICD-10-CM | POA: Diagnosis not present

## 2015-12-05 DIAGNOSIS — E119 Type 2 diabetes mellitus without complications: Secondary | ICD-10-CM | POA: Diagnosis not present

## 2015-12-06 ENCOUNTER — Other Ambulatory Visit: Payer: Self-pay | Admitting: *Deleted

## 2015-12-06 NOTE — Patient Outreach (Signed)
[facility]) Care Management  12/06/2015  Ernest [name] [birth year] [id number]   Phone call to patient to follow up on completion of their Medicaid application.  HIPPA compliant Voicemail message left for a return call.  [name] [facility] Care Management [phone]

## 2015-12-07 DIAGNOSIS — E119 Type 2 diabetes mellitus without complications: Secondary | ICD-10-CM | POA: Diagnosis not present

## 2015-12-07 DIAGNOSIS — D631 Anemia in chronic kidney disease: Secondary | ICD-10-CM | POA: Diagnosis not present

## 2015-12-07 DIAGNOSIS — N186 End stage renal disease: Secondary | ICD-10-CM | POA: Diagnosis not present

## 2015-12-07 DIAGNOSIS — D509 Iron deficiency anemia, unspecified: Secondary | ICD-10-CM | POA: Diagnosis not present

## 2015-12-07 DIAGNOSIS — N2581 Secondary hyperparathyroidism of renal origin: Secondary | ICD-10-CM | POA: Diagnosis not present

## 2015-12-09 DIAGNOSIS — N2581 Secondary hyperparathyroidism of renal origin: Secondary | ICD-10-CM | POA: Diagnosis not present

## 2015-12-09 DIAGNOSIS — N186 End stage renal disease: Secondary | ICD-10-CM | POA: Diagnosis not present

## 2015-12-09 DIAGNOSIS — D509 Iron deficiency anemia, unspecified: Secondary | ICD-10-CM | POA: Diagnosis not present

## 2015-12-09 DIAGNOSIS — D631 Anemia in chronic kidney disease: Secondary | ICD-10-CM | POA: Diagnosis not present

## 2015-12-09 DIAGNOSIS — E119 Type 2 diabetes mellitus without complications: Secondary | ICD-10-CM | POA: Diagnosis not present

## 2015-12-12 DIAGNOSIS — D631 Anemia in chronic kidney disease: Secondary | ICD-10-CM | POA: Diagnosis not present

## 2015-12-12 DIAGNOSIS — N2581 Secondary hyperparathyroidism of renal origin: Secondary | ICD-10-CM | POA: Diagnosis not present

## 2015-12-12 DIAGNOSIS — E119 Type 2 diabetes mellitus without complications: Secondary | ICD-10-CM | POA: Diagnosis not present

## 2015-12-12 DIAGNOSIS — D509 Iron deficiency anemia, unspecified: Secondary | ICD-10-CM | POA: Diagnosis not present

## 2015-12-12 DIAGNOSIS — N186 End stage renal disease: Secondary | ICD-10-CM | POA: Diagnosis not present

## 2015-12-13 DIAGNOSIS — E1129 Type 2 diabetes mellitus with other diabetic kidney complication: Secondary | ICD-10-CM | POA: Diagnosis not present

## 2015-12-13 DIAGNOSIS — Z992 Dependence on renal dialysis: Secondary | ICD-10-CM | POA: Diagnosis not present

## 2015-12-13 DIAGNOSIS — N186 End stage renal disease: Secondary | ICD-10-CM | POA: Diagnosis not present

## 2015-12-14 DIAGNOSIS — N186 End stage renal disease: Secondary | ICD-10-CM | POA: Diagnosis not present

## 2015-12-14 DIAGNOSIS — N2581 Secondary hyperparathyroidism of renal origin: Secondary | ICD-10-CM | POA: Diagnosis not present

## 2015-12-14 DIAGNOSIS — E119 Type 2 diabetes mellitus without complications: Secondary | ICD-10-CM | POA: Diagnosis not present

## 2015-12-14 DIAGNOSIS — D631 Anemia in chronic kidney disease: Secondary | ICD-10-CM | POA: Diagnosis not present

## 2015-12-14 DIAGNOSIS — D509 Iron deficiency anemia, unspecified: Secondary | ICD-10-CM | POA: Diagnosis not present

## 2015-12-16 DIAGNOSIS — N2581 Secondary hyperparathyroidism of renal origin: Secondary | ICD-10-CM | POA: Diagnosis not present

## 2015-12-16 DIAGNOSIS — E119 Type 2 diabetes mellitus without complications: Secondary | ICD-10-CM | POA: Diagnosis not present

## 2015-12-16 DIAGNOSIS — D631 Anemia in chronic kidney disease: Secondary | ICD-10-CM | POA: Diagnosis not present

## 2015-12-16 DIAGNOSIS — N186 End stage renal disease: Secondary | ICD-10-CM | POA: Diagnosis not present

## 2015-12-16 DIAGNOSIS — D509 Iron deficiency anemia, unspecified: Secondary | ICD-10-CM | POA: Diagnosis not present

## 2015-12-19 DIAGNOSIS — N2581 Secondary hyperparathyroidism of renal origin: Secondary | ICD-10-CM | POA: Diagnosis not present

## 2015-12-19 DIAGNOSIS — D631 Anemia in chronic kidney disease: Secondary | ICD-10-CM | POA: Diagnosis not present

## 2015-12-19 DIAGNOSIS — N186 End stage renal disease: Secondary | ICD-10-CM | POA: Diagnosis not present

## 2015-12-19 DIAGNOSIS — D509 Iron deficiency anemia, unspecified: Secondary | ICD-10-CM | POA: Diagnosis not present

## 2015-12-19 DIAGNOSIS — E119 Type 2 diabetes mellitus without complications: Secondary | ICD-10-CM | POA: Diagnosis not present

## 2015-12-20 ENCOUNTER — Other Ambulatory Visit: Payer: Self-pay | Admitting: *Deleted

## 2015-12-20 NOTE — Patient Outreach (Signed)
[facility]) Care Management  12/20/2015  ENRRIQUE PURI [birth year] [id number]   Phone call to patient and patient's spouse to follow up on status of Medicaid application.  Per patient's spouse, she has not submitted the additional paperwork needed to completed the application.  Per patient's spouse, she will at some point,  however, she does not feel that she will qualify for Medicaid.    Patient's preliminary Medicaid application has been submitted.  Patient has now been assigned a case worker through the Department of [place] and will be able to follow up with her in regards to  the status of the application .   Plan:  Patient and patient's spouse encouraged to complete the Medicaid application process.   Case to be closed at this time.     [name] [facility] Care Management [phone]

## 2015-12-21 ENCOUNTER — Encounter: Payer: Self-pay | Admitting: *Deleted

## 2015-12-21 DIAGNOSIS — D631 Anemia in chronic kidney disease: Secondary | ICD-10-CM | POA: Diagnosis not present

## 2015-12-21 DIAGNOSIS — N186 End stage renal disease: Secondary | ICD-10-CM | POA: Diagnosis not present

## 2015-12-21 DIAGNOSIS — N2581 Secondary hyperparathyroidism of renal origin: Secondary | ICD-10-CM | POA: Diagnosis not present

## 2015-12-21 DIAGNOSIS — D509 Iron deficiency anemia, unspecified: Secondary | ICD-10-CM | POA: Diagnosis not present

## 2015-12-21 DIAGNOSIS — E119 Type 2 diabetes mellitus without complications: Secondary | ICD-10-CM | POA: Diagnosis not present

## 2015-12-22 ENCOUNTER — Ambulatory Visit: Payer: Medicare Other | Admitting: Pharmacist

## 2015-12-23 DIAGNOSIS — N2581 Secondary hyperparathyroidism of renal origin: Secondary | ICD-10-CM | POA: Diagnosis not present

## 2015-12-23 DIAGNOSIS — E119 Type 2 diabetes mellitus without complications: Secondary | ICD-10-CM | POA: Diagnosis not present

## 2015-12-23 DIAGNOSIS — D509 Iron deficiency anemia, unspecified: Secondary | ICD-10-CM | POA: Diagnosis not present

## 2015-12-23 DIAGNOSIS — N186 End stage renal disease: Secondary | ICD-10-CM | POA: Diagnosis not present

## 2015-12-23 DIAGNOSIS — D631 Anemia in chronic kidney disease: Secondary | ICD-10-CM | POA: Diagnosis not present

## 2015-12-26 DIAGNOSIS — N2581 Secondary hyperparathyroidism of renal origin: Secondary | ICD-10-CM | POA: Diagnosis not present

## 2015-12-26 DIAGNOSIS — D631 Anemia in chronic kidney disease: Secondary | ICD-10-CM | POA: Diagnosis not present

## 2015-12-26 DIAGNOSIS — E119 Type 2 diabetes mellitus without complications: Secondary | ICD-10-CM | POA: Diagnosis not present

## 2015-12-26 DIAGNOSIS — N186 End stage renal disease: Secondary | ICD-10-CM | POA: Diagnosis not present

## 2015-12-26 DIAGNOSIS — D509 Iron deficiency anemia, unspecified: Secondary | ICD-10-CM | POA: Diagnosis not present

## 2015-12-28 DIAGNOSIS — N2581 Secondary hyperparathyroidism of renal origin: Secondary | ICD-10-CM | POA: Diagnosis not present

## 2015-12-28 DIAGNOSIS — D631 Anemia in chronic kidney disease: Secondary | ICD-10-CM | POA: Diagnosis not present

## 2015-12-28 DIAGNOSIS — E119 Type 2 diabetes mellitus without complications: Secondary | ICD-10-CM | POA: Diagnosis not present

## 2015-12-28 DIAGNOSIS — D509 Iron deficiency anemia, unspecified: Secondary | ICD-10-CM | POA: Diagnosis not present

## 2015-12-28 DIAGNOSIS — N186 End stage renal disease: Secondary | ICD-10-CM | POA: Diagnosis not present

## 2015-12-30 DIAGNOSIS — D509 Iron deficiency anemia, unspecified: Secondary | ICD-10-CM | POA: Diagnosis not present

## 2015-12-30 DIAGNOSIS — N186 End stage renal disease: Secondary | ICD-10-CM | POA: Diagnosis not present

## 2015-12-30 DIAGNOSIS — D631 Anemia in chronic kidney disease: Secondary | ICD-10-CM | POA: Diagnosis not present

## 2015-12-30 DIAGNOSIS — E119 Type 2 diabetes mellitus without complications: Secondary | ICD-10-CM | POA: Diagnosis not present

## 2015-12-30 DIAGNOSIS — N2581 Secondary hyperparathyroidism of renal origin: Secondary | ICD-10-CM | POA: Diagnosis not present

## 2016-01-02 DIAGNOSIS — N186 End stage renal disease: Secondary | ICD-10-CM | POA: Diagnosis not present

## 2016-01-02 DIAGNOSIS — D631 Anemia in chronic kidney disease: Secondary | ICD-10-CM | POA: Diagnosis not present

## 2016-01-02 DIAGNOSIS — E119 Type 2 diabetes mellitus without complications: Secondary | ICD-10-CM | POA: Diagnosis not present

## 2016-01-02 DIAGNOSIS — D509 Iron deficiency anemia, unspecified: Secondary | ICD-10-CM | POA: Diagnosis not present

## 2016-01-02 DIAGNOSIS — N2581 Secondary hyperparathyroidism of renal origin: Secondary | ICD-10-CM | POA: Diagnosis not present

## 2016-01-04 DIAGNOSIS — D509 Iron deficiency anemia, unspecified: Secondary | ICD-10-CM | POA: Diagnosis not present

## 2016-01-04 DIAGNOSIS — D631 Anemia in chronic kidney disease: Secondary | ICD-10-CM | POA: Diagnosis not present

## 2016-01-04 DIAGNOSIS — E119 Type 2 diabetes mellitus without complications: Secondary | ICD-10-CM | POA: Diagnosis not present

## 2016-01-04 DIAGNOSIS — N186 End stage renal disease: Secondary | ICD-10-CM | POA: Diagnosis not present

## 2016-01-04 DIAGNOSIS — N2581 Secondary hyperparathyroidism of renal origin: Secondary | ICD-10-CM | POA: Diagnosis not present

## 2016-01-06 DIAGNOSIS — N2581 Secondary hyperparathyroidism of renal origin: Secondary | ICD-10-CM | POA: Diagnosis not present

## 2016-01-06 DIAGNOSIS — E119 Type 2 diabetes mellitus without complications: Secondary | ICD-10-CM | POA: Diagnosis not present

## 2016-01-06 DIAGNOSIS — D509 Iron deficiency anemia, unspecified: Secondary | ICD-10-CM | POA: Diagnosis not present

## 2016-01-06 DIAGNOSIS — N186 End stage renal disease: Secondary | ICD-10-CM | POA: Diagnosis not present

## 2016-01-06 DIAGNOSIS — D631 Anemia in chronic kidney disease: Secondary | ICD-10-CM | POA: Diagnosis not present

## 2016-01-09 DIAGNOSIS — N2581 Secondary hyperparathyroidism of renal origin: Secondary | ICD-10-CM | POA: Diagnosis not present

## 2016-01-09 DIAGNOSIS — E119 Type 2 diabetes mellitus without complications: Secondary | ICD-10-CM | POA: Diagnosis not present

## 2016-01-09 DIAGNOSIS — D631 Anemia in chronic kidney disease: Secondary | ICD-10-CM | POA: Diagnosis not present

## 2016-01-09 DIAGNOSIS — N186 End stage renal disease: Secondary | ICD-10-CM | POA: Diagnosis not present

## 2016-01-09 DIAGNOSIS — D509 Iron deficiency anemia, unspecified: Secondary | ICD-10-CM | POA: Diagnosis not present

## 2016-01-11 DIAGNOSIS — N2581 Secondary hyperparathyroidism of renal origin: Secondary | ICD-10-CM | POA: Diagnosis not present

## 2016-01-11 DIAGNOSIS — D509 Iron deficiency anemia, unspecified: Secondary | ICD-10-CM | POA: Diagnosis not present

## 2016-01-11 DIAGNOSIS — D631 Anemia in chronic kidney disease: Secondary | ICD-10-CM | POA: Diagnosis not present

## 2016-01-11 DIAGNOSIS — E119 Type 2 diabetes mellitus without complications: Secondary | ICD-10-CM | POA: Diagnosis not present

## 2016-01-11 DIAGNOSIS — N186 End stage renal disease: Secondary | ICD-10-CM | POA: Diagnosis not present

## 2016-01-13 DIAGNOSIS — Z992 Dependence on renal dialysis: Secondary | ICD-10-CM | POA: Diagnosis not present

## 2016-01-13 DIAGNOSIS — N2581 Secondary hyperparathyroidism of renal origin: Secondary | ICD-10-CM | POA: Diagnosis not present

## 2016-01-13 DIAGNOSIS — D509 Iron deficiency anemia, unspecified: Secondary | ICD-10-CM | POA: Diagnosis not present

## 2016-01-13 DIAGNOSIS — N186 End stage renal disease: Secondary | ICD-10-CM | POA: Diagnosis not present

## 2016-01-13 DIAGNOSIS — D631 Anemia in chronic kidney disease: Secondary | ICD-10-CM | POA: Diagnosis not present

## 2016-01-13 DIAGNOSIS — E1129 Type 2 diabetes mellitus with other diabetic kidney complication: Secondary | ICD-10-CM | POA: Diagnosis not present

## 2016-01-13 DIAGNOSIS — E119 Type 2 diabetes mellitus without complications: Secondary | ICD-10-CM | POA: Diagnosis not present

## 2016-01-16 DIAGNOSIS — D631 Anemia in chronic kidney disease: Secondary | ICD-10-CM | POA: Diagnosis not present

## 2016-01-16 DIAGNOSIS — D509 Iron deficiency anemia, unspecified: Secondary | ICD-10-CM | POA: Diagnosis not present

## 2016-01-16 DIAGNOSIS — N2581 Secondary hyperparathyroidism of renal origin: Secondary | ICD-10-CM | POA: Diagnosis not present

## 2016-01-16 DIAGNOSIS — N186 End stage renal disease: Secondary | ICD-10-CM | POA: Diagnosis not present

## 2016-01-16 DIAGNOSIS — E119 Type 2 diabetes mellitus without complications: Secondary | ICD-10-CM | POA: Diagnosis not present

## 2016-01-18 DIAGNOSIS — E119 Type 2 diabetes mellitus without complications: Secondary | ICD-10-CM | POA: Diagnosis not present

## 2016-01-18 DIAGNOSIS — D631 Anemia in chronic kidney disease: Secondary | ICD-10-CM | POA: Diagnosis not present

## 2016-01-18 DIAGNOSIS — N186 End stage renal disease: Secondary | ICD-10-CM | POA: Diagnosis not present

## 2016-01-18 DIAGNOSIS — N2581 Secondary hyperparathyroidism of renal origin: Secondary | ICD-10-CM | POA: Diagnosis not present

## 2016-01-18 DIAGNOSIS — D509 Iron deficiency anemia, unspecified: Secondary | ICD-10-CM | POA: Diagnosis not present

## 2016-01-20 DIAGNOSIS — D509 Iron deficiency anemia, unspecified: Secondary | ICD-10-CM | POA: Diagnosis not present

## 2016-01-20 DIAGNOSIS — N186 End stage renal disease: Secondary | ICD-10-CM | POA: Diagnosis not present

## 2016-01-20 DIAGNOSIS — N2581 Secondary hyperparathyroidism of renal origin: Secondary | ICD-10-CM | POA: Diagnosis not present

## 2016-01-20 DIAGNOSIS — D631 Anemia in chronic kidney disease: Secondary | ICD-10-CM | POA: Diagnosis not present

## 2016-01-20 DIAGNOSIS — E119 Type 2 diabetes mellitus without complications: Secondary | ICD-10-CM | POA: Diagnosis not present

## 2016-01-23 DIAGNOSIS — D631 Anemia in chronic kidney disease: Secondary | ICD-10-CM | POA: Diagnosis not present

## 2016-01-23 DIAGNOSIS — E119 Type 2 diabetes mellitus without complications: Secondary | ICD-10-CM | POA: Diagnosis not present

## 2016-01-23 DIAGNOSIS — N2581 Secondary hyperparathyroidism of renal origin: Secondary | ICD-10-CM | POA: Diagnosis not present

## 2016-01-23 DIAGNOSIS — N186 End stage renal disease: Secondary | ICD-10-CM | POA: Diagnosis not present

## 2016-01-23 DIAGNOSIS — D509 Iron deficiency anemia, unspecified: Secondary | ICD-10-CM | POA: Diagnosis not present

## 2016-01-25 DIAGNOSIS — N186 End stage renal disease: Secondary | ICD-10-CM | POA: Diagnosis not present

## 2016-01-25 DIAGNOSIS — D631 Anemia in chronic kidney disease: Secondary | ICD-10-CM | POA: Diagnosis not present

## 2016-01-25 DIAGNOSIS — E119 Type 2 diabetes mellitus without complications: Secondary | ICD-10-CM | POA: Diagnosis not present

## 2016-01-25 DIAGNOSIS — D509 Iron deficiency anemia, unspecified: Secondary | ICD-10-CM | POA: Diagnosis not present

## 2016-01-25 DIAGNOSIS — N2581 Secondary hyperparathyroidism of renal origin: Secondary | ICD-10-CM | POA: Diagnosis not present

## 2016-01-27 DIAGNOSIS — E119 Type 2 diabetes mellitus without complications: Secondary | ICD-10-CM | POA: Diagnosis not present

## 2016-01-27 DIAGNOSIS — D509 Iron deficiency anemia, unspecified: Secondary | ICD-10-CM | POA: Diagnosis not present

## 2016-01-27 DIAGNOSIS — D631 Anemia in chronic kidney disease: Secondary | ICD-10-CM | POA: Diagnosis not present

## 2016-01-27 DIAGNOSIS — N2581 Secondary hyperparathyroidism of renal origin: Secondary | ICD-10-CM | POA: Diagnosis not present

## 2016-01-27 DIAGNOSIS — N186 End stage renal disease: Secondary | ICD-10-CM | POA: Diagnosis not present

## 2016-01-30 ENCOUNTER — Encounter (HOSPITAL_COMMUNITY): Payer: Self-pay | Admitting: Emergency Medicine

## 2016-01-30 ENCOUNTER — Emergency Department (HOSPITAL_COMMUNITY)
Admission: EM | Admit: 2016-01-30 | Discharge: 2016-01-31 | Disposition: A | Discharge status: Home / Self Care | Payer: Medicare Other | Attending: Emergency Medicine | Admitting: Emergency Medicine

## 2016-01-30 ENCOUNTER — Emergency Department (HOSPITAL_COMMUNITY): Payer: Medicare Other

## 2016-01-30 DIAGNOSIS — R29818 Other symptoms and signs involving the nervous system: Secondary | ICD-10-CM | POA: Diagnosis not present

## 2016-01-30 DIAGNOSIS — I120 Hypertensive chronic kidney disease with stage 5 chronic kidney disease or end stage renal disease: Secondary | ICD-10-CM | POA: Insufficient documentation

## 2016-01-30 DIAGNOSIS — E119 Type 2 diabetes mellitus without complications: Secondary | ICD-10-CM | POA: Diagnosis not present

## 2016-01-30 DIAGNOSIS — Z79899 Other long term (current) drug therapy: Secondary | ICD-10-CM | POA: Insufficient documentation

## 2016-01-30 DIAGNOSIS — R531 Weakness: Secondary | ICD-10-CM | POA: Insufficient documentation

## 2016-01-30 DIAGNOSIS — Z794 Long term (current) use of insulin: Secondary | ICD-10-CM | POA: Insufficient documentation

## 2016-01-30 DIAGNOSIS — I509 Heart failure, unspecified: Secondary | ICD-10-CM | POA: Insufficient documentation

## 2016-01-30 DIAGNOSIS — D631 Anemia in chronic kidney disease: Secondary | ICD-10-CM | POA: Diagnosis not present

## 2016-01-30 DIAGNOSIS — D649 Anemia, unspecified: Secondary | ICD-10-CM | POA: Insufficient documentation

## 2016-01-30 DIAGNOSIS — I2510 Atherosclerotic heart disease of native coronary artery without angina pectoris: Secondary | ICD-10-CM | POA: Insufficient documentation

## 2016-01-30 DIAGNOSIS — M6281 Muscle weakness (generalized): Secondary | ICD-10-CM | POA: Diagnosis not present

## 2016-01-30 DIAGNOSIS — I69398 Other sequelae of cerebral infarction: Secondary | ICD-10-CM | POA: Diagnosis not present

## 2016-01-30 DIAGNOSIS — Z87438 Personal history of other diseases of male genital organs: Secondary | ICD-10-CM | POA: Diagnosis not present

## 2016-01-30 DIAGNOSIS — D509 Iron deficiency anemia, unspecified: Secondary | ICD-10-CM | POA: Diagnosis not present

## 2016-01-30 DIAGNOSIS — J449 Chronic obstructive pulmonary disease, unspecified: Secondary | ICD-10-CM | POA: Insufficient documentation

## 2016-01-30 DIAGNOSIS — N2581 Secondary hyperparathyroidism of renal origin: Secondary | ICD-10-CM | POA: Diagnosis not present

## 2016-01-30 DIAGNOSIS — Z8669 Personal history of other diseases of the nervous system and sense organs: Secondary | ICD-10-CM | POA: Diagnosis not present

## 2016-01-30 DIAGNOSIS — E669 Obesity, unspecified: Secondary | ICD-10-CM | POA: Diagnosis not present

## 2016-01-30 DIAGNOSIS — E785 Hyperlipidemia, unspecified: Secondary | ICD-10-CM | POA: Diagnosis not present

## 2016-01-30 DIAGNOSIS — Z992 Dependence on renal dialysis: Secondary | ICD-10-CM | POA: Diagnosis not present

## 2016-01-30 DIAGNOSIS — Z7901 Long term (current) use of anticoagulants: Secondary | ICD-10-CM | POA: Diagnosis not present

## 2016-01-30 DIAGNOSIS — R404 Transient alteration of awareness: Secondary | ICD-10-CM | POA: Diagnosis not present

## 2016-01-30 DIAGNOSIS — N186 End stage renal disease: Secondary | ICD-10-CM | POA: Insufficient documentation

## 2016-01-30 DIAGNOSIS — Z7982 Long term (current) use of aspirin: Secondary | ICD-10-CM | POA: Insufficient documentation

## 2016-01-30 DIAGNOSIS — Z87891 Personal history of nicotine dependence: Secondary | ICD-10-CM | POA: Insufficient documentation

## 2016-01-30 DIAGNOSIS — Z8619 Personal history of other infectious and parasitic diseases: Secondary | ICD-10-CM | POA: Diagnosis not present

## 2016-01-30 DIAGNOSIS — M109 Gout, unspecified: Secondary | ICD-10-CM | POA: Diagnosis not present

## 2016-01-30 DIAGNOSIS — R5383 Other fatigue: Secondary | ICD-10-CM | POA: Diagnosis present

## 2016-01-30 DIAGNOSIS — Z9889 Other specified postprocedural states: Secondary | ICD-10-CM | POA: Insufficient documentation

## 2016-01-30 DIAGNOSIS — R42 Dizziness and giddiness: Secondary | ICD-10-CM | POA: Diagnosis not present

## 2016-01-30 LAB — COMPREHENSIVE METABOLIC PANEL
ALBUMIN: 3.6 g/dL (ref 3.5–5.0)
ALK PHOS: 56 U/L (ref 38–126)
ALT: 12 U/L — ABNORMAL LOW (ref 17–63)
AST: 16 U/L (ref 15–41)
Anion gap: 15 (ref 5–15)
BILIRUBIN TOTAL: 0.4 mg/dL (ref 0.3–1.2)
BUN: 45 mg/dL — AB (ref 6–20)
CALCIUM: 9.4 mg/dL (ref 8.9–10.3)
CO2: 30 mmol/L (ref 22–32)
CREATININE: 9.27 mg/dL — AB (ref 0.61–1.24)
Chloride: 92 mmol/L — ABNORMAL LOW (ref 101–111)
GFR calc Af Amer: 6 mL/min — ABNORMAL LOW (ref >60)
GFR, EST NON AFRICAN AMERICAN: 5 mL/min — AB (ref >60)
GLUCOSE: 194 mg/dL — AB (ref 65–99)
POTASSIUM: 4.2 mmol/L (ref 3.5–5.1)
Sodium: 137 mmol/L (ref 135–145)
TOTAL PROTEIN: 6.6 g/dL (ref 6.5–8.1)

## 2016-01-30 LAB — I-STAT CHEM 8, ED
BUN: 45 mg/dL — AB (ref 6–20)
CREATININE: 9.4 mg/dL — AB (ref 0.61–1.24)
Calcium, Ion: 0.94 mmol/L — ABNORMAL LOW (ref 1.13–1.30)
Chloride: 93 mmol/L — ABNORMAL LOW (ref 101–111)
GLUCOSE: 191 mg/dL — AB (ref 65–99)
HCT: 39 % (ref 39.0–52.0)
HEMOGLOBIN: 13.3 g/dL (ref 13.0–17.0)
POTASSIUM: 4.3 mmol/L (ref 3.5–5.1)
Sodium: 134 mmol/L — ABNORMAL LOW (ref 135–145)
TCO2: 31 mmol/L (ref 0–100)

## 2016-01-30 LAB — CBC
HEMATOCRIT: 34.5 % — AB (ref 39.0–52.0)
HEMOGLOBIN: 11.5 g/dL — AB (ref 13.0–17.0)
MCH: 29.4 pg (ref 26.0–34.0)
MCHC: 33.3 g/dL (ref 30.0–36.0)
MCV: 88.2 fL (ref 78.0–100.0)
Platelets: 164 10*3/uL (ref 150–400)
RBC: 3.91 MIL/uL — ABNORMAL LOW (ref 4.22–5.81)
RDW: 15.2 % (ref 11.5–15.5)
WBC: 8.5 10*3/uL (ref 4.0–10.5)

## 2016-01-30 LAB — I-STAT CG4 LACTIC ACID, ED: Lactic Acid, Venous: 1.6 mmol/L (ref 0.5–2.0)

## 2016-01-30 LAB — I-STAT TROPONIN, ED: Troponin i, poc: 0.05 ng/mL (ref 0.00–0.08)

## 2016-01-30 LAB — BRAIN NATRIURETIC PEPTIDE: B NATRIURETIC PEPTIDE 5: 1146.7 pg/mL — AB (ref 0.0–100.0)

## 2016-01-30 MED ORDER — ONDANSETRON 4 MG PO TBDP
4.0000 mg | ORAL_TABLET | Freq: Once | ORAL | Status: AC
  Administered 2016-01-30: 4 mg via ORAL
  Filled 2016-01-30: qty 1

## 2016-01-30 NOTE — ED Provider Notes (Signed)
CSN: [id number]     Arrival date & time 01/30/16  2159 History  By signing my name below, I, Ernest [name], attest that this documentation has been prepared under the direction and in the presence of Ernest Iles, MD. Electronically Signed: [name], ED Scribe. 01/31/2016. 12:34 AM   Chief Complaint  Patient presents with  . Fatigue    The history is provided by the patient. No language interpreter was used.   Ernest [name] is a 70 y.o. male with PMHx of stroke, ESRD on M/W/F hemodialysis, CAD, DM, HTN, HLD, CHF, and COPD who presents to the Emergency Department complaining of new and constant bilateral lower extremity weakness with onset this morning. Pt states that he fell this morning due to the weakness in conjunction with his baseline balance difficulties after a stroke last fall. He did not strike his head or otherwise sustain any injuries in the fall and a relative helped him off the floor. No LOC. The weakness persisted after dialysis. Associated symptoms include ongoing nausea and an episode of emesis tonight at dinner. He has had a cough over the last month.  Pt denies lightheadedness, vision change, headache, new upper extremity weakness,  chest pain, SOB,abdominal pain,  diarrhea, numbness . No known sick contact. He continues to use a daily aspirin and Plavix.   Past Medical History  Diagnosis Date  . Hyperlipidemia   . Retinopathy   . Gout   . ESRD (end stage renal disease) ([place])     Started HD in [place] in 2009, ESRD was due to DM. Moved to [facility] in Dec 2009 and now gets dialysis at [facility] on a MWF schedule.     . Erectile dysfunction     penile implant  . Abnormal stress test     s/p cath November 2013 with modest disease involving the ostial left main, proximal LAD, proximal RCA - do not appear to be hemodynamically signficant; mild LV dysfunction  . Obesity   . Hypertension   . Hepatitis C   . COPD (chronic obstructive pulmonary disease) ([place])   .  Coronary artery disease     per cath report 2013  . Ejection fraction < 50%   . Chronic systolic CHF (congestive heart failure) ([place])   . Bacteremia   . Shortness of breath     Hx: of with exertion  . Gout     Hx: of  . Stroke ([place])   . Type II or unspecified type diabetes mellitus without mention of complication, not stated as uncontrolled     adult onset   Past Surgical History  Procedure Laterality Date  . Penile prosthesis implant      1997.. no card  . Fistula      RUE and wrist  . Cardiac catheterization  August 26, 2012  . Eye surgery      laser.  and surgery for DM  . Insertion of dialysis catheter Right 01/01/2013    Procedure: INSERTION OF DIALYSIS CATHETER right  internal jugular;  Surgeon: Serafina Mitchell, MD;  Location: [place];  Service: Vascular;  Laterality: Right;  . Av fistula placement Left 01/13/2013    Procedure: INSERTION OF ARTERIOVENOUS (AV) GORE-TEX GRAFT ARM;  Surgeon: Angelia Mould, MD;  Location: [place];  Service: Vascular;  Laterality: Left;  . Thrombectomy w/ embolectomy Left 03/26/2013    Procedure: Attempted thrombectomy of left arm arteriovenous goretex graft.;  Surgeon: Angelia Mould, MD;  Location: [place];  Service: Vascular;  Laterality: Left;  . Insertion of dialysis catheter N/A 03/26/2013    Procedure: INSERTION OF DIALYSIS CATHETER Left internal jugular vein;  Surgeon: Angelia Mould, MD;  Location: [place];  Service: Vascular;  Laterality: N/A;  . Avgg removal Left 03/26/2013    Procedure: REMOVAL OF  NON INCORPORATED ARTERIOVENOUS GORETEX GRAFT ([place]) left arm * repair of  left brachial artery with vein patch angioplasty.;  Surgeon: Angelia Mould, MD;  Location: [place];  Service: Vascular;  Laterality: Left;  . Av fistula placement Left 05/05/2013    Procedure: INSERTION OF LEFT UPPER ARM  ARTERIOVENOUS GORTEX GRAFT;  Surgeon: Angelia Mould, MD;  Location: [place];  Service: Vascular;  Laterality: Left;  . Tee  without cardioversion N/A 06/11/2013    Procedure: TRANSESOPHAGEAL ECHOCARDIOGRAM (TEE);  Surgeon: Larey Dresser, MD;  Location: [place];  Service: Cardiovascular;  Laterality: N/A;  . Embolectomy Right 08/27/2013    Procedure: EMBOLECTOMY;  Surgeon: Mal Misty, MD;  Location: [place];  Service: Vascular;  Laterality: Right;  Thrombectomy of Radial and ulnar artery.  . Cataract extraction w/ intraocular lens  implant, bilateral    . Colonoscopy    . Ligation of arteriovenous  fistula Right 12/30/2013    Procedure: REMOVAL OF SEGMENT OF GORTEX GRAFT AND FISTULA  AND REPAIR OF BRACHIAL ARTERY;  Surgeon: Mal Misty, MD;  Location: [place];  Service: Vascular;  Laterality: Right;  Annell Greening N/A 04/07/2013    Procedure: VENOGRAM;  Surgeon: Serafina Mitchell, MD;  Location: [facility] CATH LAB;  Service: Cardiovascular;  Laterality: N/A;   Family History  Problem Relation Age of Onset  . Heart disease Father   . Diabetes Sister   . Diabetes Brother   . Diabetes Son    Social History  Substance Use Topics  . Smoking status: Former Smoker -- 7 years    Quit date: 06/10/1973  . Smokeless tobacco: Never Used     Comment: Quit in 1974.  [name] Alcohol Use: No    Review of Systems 10 Systems reviewed and all are negative for acute change except as noted in the HPI.   Allergies  Review of patient's allergies indicates no known allergies.  Home Medications   Prior to Admission medications   Medication Sig Start Date End Date Taking? Authorizing Provider  ACCU-CHEK FASTCLIX LANCETS MISC Check blood sugar 4 times a day before meals and bedtime 11/29/15   Juliet Rude, MD  allopurinol (ZYLOPRIM) 100 MG tablet Take 1 tablet (100 mg total) by mouth daily. 11/29/15   Carly Montey Hora, MD  amitriptyline (ELAVIL) 25 MG tablet TAKE ONE TABLET BY MOUTH AT BEDTIME FOR  NEUROPATHY 11/29/15   Juliet Rude, MD  aspirin EC 81 MG tablet Take 1 tablet (81 mg total) by mouth daily. 11/29/15   Carly Montey Hora, MD   atorvastatin (LIPITOR) 40 MG tablet Take 1 tablet (40 mg total) by mouth daily. 11/29/15   Juliet Rude, MD  cinacalcet (SENSIPAR) 60 MG tablet Take 1 tablet (60 mg total) by mouth 2 (two) times daily. 11/29/15   Juliet Rude, MD  clopidogrel (PLAVIX) 75 MG tablet Take 1 tablet (75 mg total) by mouth daily. 11/29/15   Juliet Rude, MD  colchicine 0.6 MG tablet 1 per day on Monday and Friday 11/29/15   Carly J Rivet, MD  glucose blood (ACCU-CHEK AVIVA PLUS) test strip Check blood sugar 4 times a day before meals and  bedtime 11/29/15   Juliet Rude, MD  insulin glargine (LANTUS) 100 UNIT/ML injection Inject 0.1 mLs (10 Units total) into the skin at bedtime. 11/29/15   Carly Montey Hora, MD  insulin lispro (HUMALOG) 100 UNIT/ML KiwkPen Inject 0.03 mLs (3 Units total) into the skin 3 (three) times daily with meals. 11/29/15   Juliet Rude, MD  Insulin Pen Needle 32G X 5 MM MISC Please use 1 pen needle for each injection 11/29/15   Carly J Rivet, MD  levETIRAcetam (KEPPRA) 500 MG tablet Take 2 tablets (1,000 mg total) by mouth daily. Then take extra 500 mg (1 tablet) on MWF after HD 11/29/15   Carly J Rivet, MD  lisinopril (PRINIVIL,ZESTRIL) 20 MG tablet Take 1 tablet (20 mg total) by mouth at bedtime. 11/29/15   Carly Montey Hora, MD  metoprolol tartrate (LOPRESSOR) 25 MG tablet Take 1 tablet (25 mg total) by mouth 2 (two) times daily. 11/29/15   Juliet Rude, MD  multivitamin (RENA-VIT) TABS tablet Take 1 tablet by mouth daily. 11/29/15   Juliet Rude, MD  sevelamer carbonate (RENVELA) 800 MG tablet Take 1 tablet (800 mg total) by mouth 3 (three) times daily with meals. 11/29/15   Carly J Rivet, MD   BP 146/107 mmHg  Pulse 79  Temp(Src) 98.4 F (36.9 C) (Oral)  Resp 13  Ht 5\' 9"  (1.753 m)  Wt 220 lb (99.791 kg)  BMI 32.47 kg/m2  SpO2 99% Physical Exam  Constitutional: He is oriented to person, place, and time. He appears well-developed and well-nourished. No distress.  Awake, alert  HENT:  Head:  Normocephalic and atraumatic.  Mildly dry mucous membranes   Eyes: Conjunctivae and EOM are normal. Pupils are equal, round, and reactive to light.  Neck: Neck supple.  Cardiovascular: Normal rate, regular rhythm and normal heart sounds.   No murmur heard. Pulmonary/Chest: Effort normal and breath sounds normal. No respiratory distress.  Abdominal: Soft. Bowel sounds are normal. He exhibits no distension.  Musculoskeletal: He exhibits no edema.  Fistula in left upper arm with palpable thrill  Neurological: He is alert and oriented to person, place, and time. He has normal reflexes. No cranial nerve deficit. He exhibits normal muscle tone.  Fluent speech, no clonus, mild lack of coordination on right finger to nose testing, subtle right pronator drift  Skin: Skin is warm and dry. No rash noted. No erythema.  Psychiatric: He has a normal mood and affect. Judgment and thought content normal.  Nursing note and vitals reviewed.   ED Course  Procedures (including critical care time) DIAGNOSTIC STUDIES: Oxygen Saturation is 99% on RA,  normal by my interpretation.    COORDINATION OF CARE: 11:17 PM Discussed treatment plan which includes CT head without contrast, lab work, CXR, EKG, and antiemetic medication with pt at bedside and pt agreed to plan.  12:31 AM-Consult complete with Dr.Vega (Neurology). Patient case explained and discussed. Call ended at 12:34 AM   Labs Review Labs Reviewed  CBC - Abnormal; Notable for the following:    RBC 3.91 (*)    Hemoglobin 11.5 (*)    HCT 34.5 (*)    All other components within normal limits  COMPREHENSIVE METABOLIC PANEL - Abnormal; Notable for the following:    Chloride 92 (*)    Glucose, Bld 194 (*)    BUN 45 (*)    Creatinine, Ser 9.27 (*)    ALT 12 (*)    GFR calc non Af Amer 5 (*)  GFR calc Af Amer 6 (*)    All other components within normal limits  BRAIN NATRIURETIC PEPTIDE - Abnormal; Notable for the following:    B Natriuretic  Peptide 1146.7 (*)    All other components within normal limits  I-STAT CHEM 8, ED - Abnormal; Notable for the following:    Sodium 134 (*)    Chloride 93 (*)    BUN 45 (*)    Creatinine, Ser 9.40 (*)    Glucose, Bld 191 (*)    Calcium, Ion 0.94 (*)    All other components within normal limits  I-STAT CG4 LACTIC ACID, ED  Randolm Idol, ED    Imaging Review Dg Chest 2 View  01/30/2016  CLINICAL DATA:  Generalized weakness after dialysis today. EXAM: CHEST  2 VIEW COMPARISON:  07/11/2015 FINDINGS: Shallow inspiration. Normal heart size and pulmonary vascularity. No focal airspace disease or consolidation in the lungs. No blunting of costophrenic angles. No pneumothorax. Mediastinal contours appear intact. Vascular graft in the right axilla. IMPRESSION: No active cardiopulmonary disease. Electronically Signed   By: Lucienne Capers M.D.   On: 01/30/2016 22:49   Ct Head Wo Contrast  01/30/2016  CLINICAL DATA:  Generalized weakness since dialysis this morning EXAM: CT HEAD WITHOUT CONTRAST TECHNIQUE: Contiguous axial images were obtained from the base of the skull through the vertex without intravenous contrast. COMPARISON:  07/13/2015 FINDINGS: No evidence of parenchymal hemorrhage or extra-axial fluid collection. No mass lesion, mass effect, or midline shift. No CT evidence of acute infarction. Subcortical white matter and periventricular small vessel ischemic changes. Intracranial atherosclerosis. Global cortical atrophy.  No ventriculomegaly. The visualized paranasal sinuses are essentially clear. Trace fluid in the left mastoid air cells. No evidence of calvarial fracture. IMPRESSION: No evidence of acute intracranial abnormality. Atrophy with small vessel ischemic changes. Electronically Signed   By: Julian Hy M.D.   On: 01/30/2016 23:58   I have personally reviewed and evaluated these lab results as part of my medical decision-making.   EKG Interpretation   Date/Time:  Monday  January 30 2016 22:03:59 EDT Ventricular Rate:  82 PR Interval:  185 QRS Duration: 110 QT Interval:  408 QTC Calculation: 476 R Axis:   -17 Text Interpretation:  Sinus rhythm Probable left atrial enlargement LVH  with secondary repolarization abnormality Borderline prolonged QT interval  No significant change since last tracing Confirmed by LITTLE MD, RACHEL  [id number]) on 01/30/2016 10:56:22 PM     Medications  ondansetron (ZOFRAN-ODT) disintegrating tablet 4 mg (4 mg Oral Given 01/30/16 2329)    MDM   Final diagnoses:  Weakness   Patient with extensive medical history including ESRD on HD and previous CVA presents with generalized weakness, particularly in his legs while walking, that he noticed this morning and has persisted after dialysis today. On exam, he was awake and alert, in no acute distress. Vital signs notable for hypertension. He was afebrile. Normal work of breathing and no abnormal lung sounds. He had a subtle right pronator drift and mild difficulty with finger to nose testing on right. He states that he has not had any right hand weakness residual from the stroke and has not noticed any new weakness of his hand today. EKG unchanged from previous. Chest x-ray negative acute. Lab work shows normal potassium, CMP similar to previous, CBC with mild anemia similar to previous. Lactate and troponin normal. Obtained head CT given his history of stroke; patient unable to have MRI due to penile implant. Head CT negative  acute.  I discussed presentation w/ neurology, Dr. Wendee Beavers, as pt denies any complaints of hand weakness but demonstrates subtle dysmetria on right. He stated that given he denies any complaints related to this finding, it does not require any further emergent evaluation. Pt is already on optimal medical management for stroke prevention w/ ASA and plavix. Pt able to tolerate liquids in ED. He was able to ambulate w/ walker without problems and no complaints while walking. I  discussed supportive care instructions and instructed to take blood pressure medications when he gets home as his blood pressure has been running higher here but he thinks that he vomited his evening dose of medication. He is without complaints on reexamination. I instructed to follow-up with PCP in 2-3 days if not improved and I reviewed return precautions. Patient voiced understanding and was discharged in satisfactory condition.   I personally performed the services described in this documentation, which was scribed in my presence. The recorded information has been reviewed and is accurate.    Ernest Iles, MD 01/31/16 [phone]

## 2016-01-30 NOTE — ED Notes (Signed)
Pt in reporting generalized weakness since dialysis this am. Only made it through 2-3 hrs. Denies pain. VSS

## 2016-01-31 ENCOUNTER — Encounter (HOSPITAL_COMMUNITY): Payer: Self-pay

## 2016-01-31 ENCOUNTER — Emergency Department (HOSPITAL_COMMUNITY): Payer: Medicare Other

## 2016-01-31 ENCOUNTER — Emergency Department (HOSPITAL_COMMUNITY)
Admission: EM | Admit: 2016-01-31 | Discharge: 2016-02-01 | Disposition: A | Discharge status: Home / Self Care | Payer: Medicare Other | Source: Home / Self Care | Attending: Emergency Medicine | Admitting: Emergency Medicine

## 2016-01-31 DIAGNOSIS — R42 Dizziness and giddiness: Secondary | ICD-10-CM

## 2016-01-31 DIAGNOSIS — R531 Weakness: Secondary | ICD-10-CM

## 2016-01-31 DIAGNOSIS — E119 Type 2 diabetes mellitus without complications: Secondary | ICD-10-CM | POA: Insufficient documentation

## 2016-01-31 DIAGNOSIS — I5022 Chronic systolic (congestive) heart failure: Secondary | ICD-10-CM | POA: Insufficient documentation

## 2016-01-31 DIAGNOSIS — J449 Chronic obstructive pulmonary disease, unspecified: Secondary | ICD-10-CM | POA: Insufficient documentation

## 2016-01-31 DIAGNOSIS — R404 Transient alteration of awareness: Secondary | ICD-10-CM | POA: Diagnosis not present

## 2016-01-31 DIAGNOSIS — Z7982 Long term (current) use of aspirin: Secondary | ICD-10-CM | POA: Insufficient documentation

## 2016-01-31 DIAGNOSIS — R1110 Vomiting, unspecified: Secondary | ICD-10-CM

## 2016-01-31 DIAGNOSIS — I120 Hypertensive chronic kidney disease with stage 5 chronic kidney disease or end stage renal disease: Secondary | ICD-10-CM

## 2016-01-31 DIAGNOSIS — Z8669 Personal history of other diseases of the nervous system and sense organs: Secondary | ICD-10-CM | POA: Insufficient documentation

## 2016-01-31 DIAGNOSIS — E669 Obesity, unspecified: Secondary | ICD-10-CM | POA: Insufficient documentation

## 2016-01-31 DIAGNOSIS — Y9389 Activity, other specified: Secondary | ICD-10-CM | POA: Insufficient documentation

## 2016-01-31 DIAGNOSIS — Z8619 Personal history of other infectious and parasitic diseases: Secondary | ICD-10-CM

## 2016-01-31 DIAGNOSIS — Z79899 Other long term (current) drug therapy: Secondary | ICD-10-CM

## 2016-01-31 DIAGNOSIS — I2510 Atherosclerotic heart disease of native coronary artery without angina pectoris: Secondary | ICD-10-CM

## 2016-01-31 DIAGNOSIS — Y998 Other external cause status: Secondary | ICD-10-CM

## 2016-01-31 DIAGNOSIS — W01198A Fall on same level from slipping, tripping and stumbling with subsequent striking against other object, initial encounter: Secondary | ICD-10-CM

## 2016-01-31 DIAGNOSIS — Z87891 Personal history of nicotine dependence: Secondary | ICD-10-CM

## 2016-01-31 DIAGNOSIS — Z794 Long term (current) use of insulin: Secondary | ICD-10-CM

## 2016-01-31 DIAGNOSIS — N186 End stage renal disease: Secondary | ICD-10-CM | POA: Insufficient documentation

## 2016-01-31 DIAGNOSIS — Z8673 Personal history of transient ischemic attack (TIA), and cerebral infarction without residual deficits: Secondary | ICD-10-CM | POA: Insufficient documentation

## 2016-01-31 DIAGNOSIS — S0081XA Abrasion of other part of head, initial encounter: Secondary | ICD-10-CM | POA: Insufficient documentation

## 2016-01-31 DIAGNOSIS — Z992 Dependence on renal dialysis: Secondary | ICD-10-CM | POA: Insufficient documentation

## 2016-01-31 DIAGNOSIS — Y9289 Other specified places as the place of occurrence of the external cause: Secondary | ICD-10-CM | POA: Insufficient documentation

## 2016-01-31 DIAGNOSIS — M109 Gout, unspecified: Secondary | ICD-10-CM

## 2016-01-31 LAB — CBC WITH DIFFERENTIAL/PLATELET
Basophils Absolute: 0 10*3/uL (ref 0.0–0.1)
Basophils Relative: 0 %
Eosinophils Absolute: 0.2 10*3/uL (ref 0.0–0.7)
Eosinophils Relative: 2 %
HCT: 36.3 % — ABNORMAL LOW (ref 39.0–52.0)
Hemoglobin: 12 g/dL — ABNORMAL LOW (ref 13.0–17.0)
Lymphocytes Relative: 25 %
Lymphs Abs: 1.9 10*3/uL (ref 0.7–4.0)
MCH: 29.5 pg (ref 26.0–34.0)
MCHC: 33.1 g/dL (ref 30.0–36.0)
MCV: 89.2 fL (ref 78.0–100.0)
Monocytes Absolute: 0.6 10*3/uL (ref 0.1–1.0)
Monocytes Relative: 8 %
Neutro Abs: 5 10*3/uL (ref 1.7–7.7)
Neutrophils Relative %: 65 %
Platelets: 192 10*3/uL (ref 150–400)
RBC: 4.07 MIL/uL — ABNORMAL LOW (ref 4.22–5.81)
RDW: 15.1 % (ref 11.5–15.5)
WBC: 7.7 10*3/uL (ref 4.0–10.5)

## 2016-01-31 LAB — BASIC METABOLIC PANEL
Anion gap: 16 — ABNORMAL HIGH (ref 5–15)
BUN: 53 mg/dL — ABNORMAL HIGH (ref 6–20)
CO2: 31 mmol/L (ref 22–32)
Calcium: 9.8 mg/dL (ref 8.9–10.3)
Chloride: 92 mmol/L — ABNORMAL LOW (ref 101–111)
Creatinine, Ser: 10.93 mg/dL — ABNORMAL HIGH (ref 0.61–1.24)
GFR calc Af Amer: 5 mL/min — ABNORMAL LOW (ref >60)
GFR calc non Af Amer: 4 mL/min — ABNORMAL LOW (ref >60)
Glucose, Bld: 160 mg/dL — ABNORMAL HIGH (ref 65–99)
Potassium: 4.5 mmol/L (ref 3.5–5.1)
Sodium: 139 mmol/L (ref 135–145)

## 2016-01-31 NOTE — ED Provider Notes (Signed)
CSN: [id number]     Arrival date & time 01/31/16  1703 History   First MD Initiated Contact with Patient 01/31/16 1837     Chief Complaint  Patient presents with  . Fall  . Dizziness    HPI The patient is a 70 year old male presentd to the ED last night due to bilateral lower extremity weakness, emesis and dizziness and was discharged home around 2am. CT head w/o contrast showed no acute cerebral changes, neurology consulted, patient encouraged to continue ASA and plavix and f/u with PCP in 2-3 days to assess symptoms. The patient woke up this morning with significant lighteadedness and "room spinning" when he woke, he leaned over to take medicines and fell on the floor and hit his head. He denies LOC, visual changes, headaches at the time. His emesis has resolved since yesterday, continues to endorse mild bilateral weakness. Patient has small abrasion on his forehead, denies lacerations or pain anywhere else.   Past Medical History  Diagnosis Date  . Hyperlipidemia   . Retinopathy   . Gout   . ESRD (end stage renal disease) ([place])     Started HD in [place] in 2009, ESRD was due to DM. Moved to [facility] in Dec 2009 and now gets dialysis at [facility] on a MWF schedule.     . Erectile dysfunction     penile implant  . Abnormal stress test     s/p cath November 2013 with modest disease involving the ostial left main, proximal LAD, proximal RCA - do not appear to be hemodynamically signficant; mild LV dysfunction  . Obesity   . Hypertension   . Hepatitis C   . COPD (chronic obstructive pulmonary disease) ([place])   . Coronary artery disease     per cath report 2013  . Ejection fraction < 50%   . Chronic systolic CHF (congestive heart failure) ([place])   . Bacteremia   . Shortness of breath     Hx: of with exertion  . Gout     Hx: of  . Stroke ([place])   . Type II or unspecified type diabetes mellitus without mention of complication, not stated as uncontrolled     adult onset   Past Surgical  History  Procedure Laterality Date  . Penile prosthesis implant      1997.. no card  . Fistula      RUE and wrist  . Cardiac catheterization  August 26, 2012  . Eye surgery      laser.  and surgery for DM  . Insertion of dialysis catheter Right 01/01/2013    Procedure: INSERTION OF DIALYSIS CATHETER right  internal jugular;  Surgeon: Serafina Mitchell, MD;  Location: [place];  Service: Vascular;  Laterality: Right;  . Av fistula placement Left 01/13/2013    Procedure: INSERTION OF ARTERIOVENOUS (AV) GORE-TEX GRAFT ARM;  Surgeon: Angelia Mould, MD;  Location: [place];  Service: Vascular;  Laterality: Left;  . Thrombectomy w/ embolectomy Left 03/26/2013    Procedure: Attempted thrombectomy of left arm arteriovenous goretex graft.;  Surgeon: Angelia Mould, MD;  Location: [place];  Service: Vascular;  Laterality: Left;  . Insertion of dialysis catheter N/A 03/26/2013    Procedure: INSERTION OF DIALYSIS CATHETER Left internal jugular vein;  Surgeon: Angelia Mould, MD;  Location: [place];  Service: Vascular;  Laterality: N/A;  . Avgg removal Left 03/26/2013    Procedure: REMOVAL OF  NON INCORPORATED ARTERIOVENOUS GORETEX GRAFT (AVGG) left arm *  repair of  left brachial artery with vein patch angioplasty.;  Surgeon: Angelia Mould, MD;  Location: [place];  Service: Vascular;  Laterality: Left;  . Av fistula placement Left 05/05/2013    Procedure: INSERTION OF LEFT UPPER ARM  ARTERIOVENOUS GORTEX GRAFT;  Surgeon: Angelia Mould, MD;  Location: [place];  Service: Vascular;  Laterality: Left;  . Tee without cardioversion N/A 06/11/2013    Procedure: TRANSESOPHAGEAL ECHOCARDIOGRAM (TEE);  Surgeon: Larey Dresser, MD;  Location: [place];  Service: Cardiovascular;  Laterality: N/A;  . Embolectomy Right 08/27/2013    Procedure: EMBOLECTOMY;  Surgeon: Mal Misty, MD;  Location: [place];  Service: Vascular;  Laterality: Right;  Thrombectomy of Radial and ulnar artery.  . Cataract  extraction w/ intraocular lens  implant, bilateral    . Colonoscopy    . Ligation of arteriovenous  fistula Right 12/30/2013    Procedure: REMOVAL OF SEGMENT OF GORTEX GRAFT AND FISTULA  AND REPAIR OF BRACHIAL ARTERY;  Surgeon: Mal Misty, MD;  Location: [place];  Service: Vascular;  Laterality: Right;  Annell Greening N/A 04/07/2013    Procedure: VENOGRAM;  Surgeon: Serafina Mitchell, MD;  Location: [facility] CATH LAB;  Service: Cardiovascular;  Laterality: N/A;   Family History  Problem Relation Age of Onset  . Heart disease Father   . Diabetes Sister   . Diabetes Brother   . Diabetes Son    Social History  Substance Use Topics  . Smoking status: Former Smoker -- 7 years    Quit date: 06/10/1973  . Smokeless tobacco: Never Used     Comment: Quit in 1974.  [name] Alcohol Use: No    Review of Systems  All other systems negative except as documented in the HPI. All pertinent positives and negatives as reviewed in the HPI.  Allergies  Review of patient's allergies indicates no known allergies.  Home Medications   Prior to Admission medications   Medication Sig Start Date End Date Taking? Authorizing Provider  ACCU-CHEK FASTCLIX LANCETS MISC Check blood sugar 4 times a day before meals and bedtime 11/29/15  Yes Carly J Rivet, MD  allopurinol (ZYLOPRIM) 100 MG tablet Take 1 tablet (100 mg total) by mouth daily. 11/29/15  Yes Carly Montey Hora, MD  amitriptyline (ELAVIL) 25 MG tablet TAKE ONE TABLET BY MOUTH AT BEDTIME FOR  NEUROPATHY 11/29/15  Yes Carly Montey Hora, MD  aspirin EC 81 MG tablet Take 1 tablet (81 mg total) by mouth daily. 11/29/15  Yes Carly Montey Hora, MD  atorvastatin (LIPITOR) 40 MG tablet Take 1 tablet (40 mg total) by mouth daily. 11/29/15  Yes Carly Montey Hora, MD  cinacalcet (SENSIPAR) 60 MG tablet Take 1 tablet (60 mg total) by mouth 2 (two) times daily. 11/29/15  Yes Carly Montey Hora, MD  clopidogrel (PLAVIX) 75 MG tablet Take 1 tablet (75 mg total) by mouth daily. 11/29/15  Yes Juliet Rude, MD   colchicine 0.6 MG tablet 1 per day on Monday and Friday 11/29/15  Yes Carly J Rivet, MD  glucose blood (ACCU-CHEK AVIVA PLUS) test strip Check blood sugar 4 times a day before meals and bedtime 11/29/15  Yes Carly J Rivet, MD  insulin glargine (LANTUS) 100 UNIT/ML injection Inject 0.1 mLs (10 Units total) into the skin at bedtime. 11/29/15  Yes Carly J Rivet, MD  insulin lispro (HUMALOG) 100 UNIT/ML KiwkPen Inject 0.03 mLs (3 Units total) into the skin 3 (three) times daily with meals. 11/29/15  Yes Carly  Montey Hora, MD  Insulin Pen Needle 32G X 5 MM MISC Please use 1 pen needle for each injection 11/29/15  Yes Carly J Rivet, MD  levETIRAcetam (KEPPRA) 500 MG tablet Take 2 tablets (1,000 mg total) by mouth daily. Then take extra 500 mg (1 tablet) on MWF after HD 11/29/15  Yes Carly J Rivet, MD  lisinopril (PRINIVIL,ZESTRIL) 20 MG tablet Take 1 tablet (20 mg total) by mouth at bedtime. 11/29/15  Yes Carly Montey Hora, MD  metoprolol tartrate (LOPRESSOR) 25 MG tablet Take 1 tablet (25 mg total) by mouth 2 (two) times daily. 11/29/15  Yes Carly Montey Hora, MD  multivitamin (RENA-VIT) TABS tablet Take 1 tablet by mouth daily. 11/29/15  Yes Carly Montey Hora, MD  sevelamer carbonate (RENVELA) 800 MG tablet Take 1 tablet (800 mg total) by mouth 3 (three) times daily with meals. 11/29/15  Yes Carly J Rivet, MD   BP 182/79 mmHg  Pulse 71  Temp(Src) 98 F (36.7 C) (Oral)  Resp 14  Ht 5\' 9"  (1.753 m)  Wt 99.791 kg  BMI 32.47 kg/m2  SpO2 97% Physical Exam  Constitutional: He is oriented to person, place, and time. He appears well-developed and well-nourished. No distress.  HENT:  Head: Normocephalic and atraumatic.  Mouth/Throat: Oropharynx is clear and moist.  Eyes: EOM are normal. Pupils are equal, round, and reactive to light.  Neck: Normal range of motion. Neck supple.  Cardiovascular: Normal rate, regular rhythm, normal heart sounds and intact distal pulses.  Exam reveals no gallop and no friction rub.   No murmur  heard. Pulmonary/Chest: Effort normal and breath sounds normal. No respiratory distress. He has no wheezes. He has no rales.  Abdominal: Soft. Bowel sounds are normal. He exhibits no distension. There is no tenderness.  Musculoskeletal:  4/5 muscle strength, some bilateral upper and lower extremity weakness. Normal muscle tone.   Neurological: He is alert and oriented to person, place, and time. No cranial nerve deficit.  Poor coordination with hand to nose and rapid hand movements  Skin: Skin is warm and dry. No rash noted. No erythema.  AV fistula over left antecubital fossa with palpable thrill noted.  2cm skin abrasion over forehead  Psychiatric: He has a normal mood and affect. His behavior is normal.  Alert and oriented X3. Slurred speech, well organized and comprehensible.  Nursing note and vitals reviewed.     ED Course  Procedures (including critical care time) Labs Review Labs Reviewed  BASIC METABOLIC PANEL  CBC WITH DIFFERENTIAL/PLATELET    Imaging Review Dg Chest 2 View  01/30/2016  CLINICAL DATA:  Generalized weakness after dialysis today. EXAM: CHEST  2 VIEW COMPARISON:  07/11/2015 FINDINGS: Shallow inspiration. Normal heart size and pulmonary vascularity. No focal airspace disease or consolidation in the lungs. No blunting of costophrenic angles. No pneumothorax. Mediastinal contours appear intact. Vascular graft in the right axilla. IMPRESSION: No active cardiopulmonary disease. Electronically Signed   By: Lucienne Capers M.D.   On: 01/30/2016 22:49   Ct Head Wo Contrast  01/30/2016  CLINICAL DATA:  Generalized weakness since dialysis this morning EXAM: CT HEAD WITHOUT CONTRAST TECHNIQUE: Contiguous axial images were obtained from the base of the skull through the vertex without intravenous contrast. COMPARISON:  07/13/2015 FINDINGS: No evidence of parenchymal hemorrhage or extra-axial fluid collection. No mass lesion, mass effect, or midline shift. No CT evidence of  acute infarction. Subcortical white matter and periventricular small vessel ischemic changes. Intracranial atherosclerosis. Global cortical atrophy.  No ventriculomegaly.  The visualized paranasal sinuses are essentially clear. Trace fluid in the left mastoid air cells. No evidence of calvarial fracture. IMPRESSION: No evidence of acute intracranial abnormality. Atrophy with small vessel ischemic changes. Electronically Signed   By: Julian Hy M.D.   On: 01/30/2016 23:58   I have personally reviewed and evaluated these images and lab results as part of my medical decision-making.   EKG Interpretation   Date/Time:  Tuesday January 31 2016 17:17:45 EDT Ventricular Rate:  67 PR Interval:  202 QRS Duration: 107 QT Interval:  462 QTC Calculation: 488 R Axis:   -7 Text Interpretation:  Sinus rhythm LVH with secondary repolarization  abnormality Borderline prolonged QT interval since last tracing no  significant change Confirmed by Eulis Foster  MD, Vira Agar [phone]) on 01/31/2016  5:42:26 PM      MDM    I spoke with neurology about the patient who states that he is maxed out on medication for stroke prevention.  He states that he would order another CT scan to assess for any changes that would indicate an acute stroke.  He states outside of this, he would not do any further testing.  I did attempt to order an MRI because the patient has a penile implant that is compatible with MRI, but the MRI techs felt this was not appropriate or safe.  Dalia Heading, [name] 02/02/16 [place], MD 02/02/16 2048

## 2016-01-31 NOTE — Discharge Instructions (Signed)

## 2016-01-31 NOTE — ED Notes (Signed)
EDP at bedside  

## 2016-01-31 NOTE — ED Notes (Signed)
Pt ambulated with walker and stand by assist. No dizziness, weakness, SOB, etc. EDP aware

## 2016-01-31 NOTE — ED Notes (Signed)
[facility]- pt coming from home with c/o dizziness upon standing that started yesterday. He is alert and oriented X4, Hx of previous stroke with speech changes which are normal per pt. He reports he stood up and became dizzy and fell and hit his head. Abrasion noted to left side of forehead. Pt is a dialysis treatment. Last dialysis yesterday but was not completed due to n/v.

## 2016-01-31 NOTE — ED Notes (Signed)
Called wife per request of pt to inform that pt is ready for DC

## 2016-01-31 NOTE — ED Provider Notes (Signed)
  Face-to-face evaluation   History: He is here for evaluation dizziness, which is ongoing and prevents him from walking without falling, onset today. Yesterday, he was vomiting. He was seen here and evaluated for same. He is able to tolerate food and fluids, since being seen here. He denies fever, chills, nausea, vomiting. He bumped his head when he fell today. He had dialysis yesterday.  Physical exam: Alert, calm, cooperative. No nystagmus or aphasia. He has mild dysarthria. Normal movement. Arms and legs bilaterally.  Medical screening examination/treatment/procedure(s) were conducted as a shared visit with non-physician practitioner(s) and myself.  I personally evaluated the patient during the encounter  Daleen Bo, MD 02/02/16 2048

## 2016-02-01 ENCOUNTER — Encounter (HOSPITAL_COMMUNITY): Payer: Self-pay | Admitting: Radiology

## 2016-02-01 ENCOUNTER — Emergency Department (HOSPITAL_COMMUNITY): Payer: Medicare Other

## 2016-02-01 DIAGNOSIS — R531 Weakness: Secondary | ICD-10-CM | POA: Diagnosis not present

## 2016-02-01 DIAGNOSIS — R42 Dizziness and giddiness: Secondary | ICD-10-CM | POA: Diagnosis not present

## 2016-02-01 NOTE — ED Notes (Signed)
Pt given sandwich and water.

## 2016-02-01 NOTE — ED Provider Notes (Signed)
Head CT with chronic findings and no acute abnormalities noted.  Patient feels better this time.  Discharge home in good condition.  Primary care follow-up.  He understands to return to the ER for any new or worsening symptoms  Jola Schmidt, MD 02/01/16 [phone]

## 2016-02-02 DIAGNOSIS — N2581 Secondary hyperparathyroidism of renal origin: Secondary | ICD-10-CM | POA: Diagnosis not present

## 2016-02-02 DIAGNOSIS — D509 Iron deficiency anemia, unspecified: Secondary | ICD-10-CM | POA: Diagnosis not present

## 2016-02-02 DIAGNOSIS — D631 Anemia in chronic kidney disease: Secondary | ICD-10-CM | POA: Diagnosis not present

## 2016-02-02 DIAGNOSIS — N186 End stage renal disease: Secondary | ICD-10-CM | POA: Diagnosis not present

## 2016-02-02 DIAGNOSIS — E119 Type 2 diabetes mellitus without complications: Secondary | ICD-10-CM | POA: Diagnosis not present

## 2016-02-03 DIAGNOSIS — N2581 Secondary hyperparathyroidism of renal origin: Secondary | ICD-10-CM | POA: Diagnosis not present

## 2016-02-03 DIAGNOSIS — N186 End stage renal disease: Secondary | ICD-10-CM | POA: Diagnosis not present

## 2016-02-03 DIAGNOSIS — D509 Iron deficiency anemia, unspecified: Secondary | ICD-10-CM | POA: Diagnosis not present

## 2016-02-03 DIAGNOSIS — E119 Type 2 diabetes mellitus without complications: Secondary | ICD-10-CM | POA: Diagnosis not present

## 2016-02-03 DIAGNOSIS — D631 Anemia in chronic kidney disease: Secondary | ICD-10-CM | POA: Diagnosis not present

## 2016-02-06 DIAGNOSIS — D509 Iron deficiency anemia, unspecified: Secondary | ICD-10-CM | POA: Diagnosis not present

## 2016-02-06 DIAGNOSIS — E119 Type 2 diabetes mellitus without complications: Secondary | ICD-10-CM | POA: Diagnosis not present

## 2016-02-06 DIAGNOSIS — N186 End stage renal disease: Secondary | ICD-10-CM | POA: Diagnosis not present

## 2016-02-06 DIAGNOSIS — D631 Anemia in chronic kidney disease: Secondary | ICD-10-CM | POA: Diagnosis not present

## 2016-02-06 DIAGNOSIS — N2581 Secondary hyperparathyroidism of renal origin: Secondary | ICD-10-CM | POA: Diagnosis not present

## 2016-02-07 ENCOUNTER — Ambulatory Visit (INDEPENDENT_AMBULATORY_CARE_PROVIDER_SITE_OTHER): Payer: Medicare Other | Admitting: Neurology

## 2016-02-07 ENCOUNTER — Encounter: Payer: Self-pay | Admitting: Neurology

## 2016-02-07 VITALS — BP 134/68 | HR 62 | Ht 69.0 in | Wt 219.0 lb

## 2016-02-07 DIAGNOSIS — N186 End stage renal disease: Secondary | ICD-10-CM

## 2016-02-07 DIAGNOSIS — R569 Unspecified convulsions: Secondary | ICD-10-CM

## 2016-02-07 DIAGNOSIS — IMO0002 Reserved for concepts with insufficient information to code with codable children: Secondary | ICD-10-CM

## 2016-02-07 DIAGNOSIS — I679 Cerebrovascular disease, unspecified: Secondary | ICD-10-CM | POA: Diagnosis not present

## 2016-02-07 DIAGNOSIS — E1165 Type 2 diabetes mellitus with hyperglycemia: Secondary | ICD-10-CM

## 2016-02-07 DIAGNOSIS — Z992 Dependence on renal dialysis: Secondary | ICD-10-CM

## 2016-02-07 DIAGNOSIS — H8110 Benign paroxysmal vertigo, unspecified ear: Secondary | ICD-10-CM

## 2016-02-07 DIAGNOSIS — I69398 Other sequelae of cerebral infarction: Secondary | ICD-10-CM | POA: Diagnosis not present

## 2016-02-07 DIAGNOSIS — E1122 Type 2 diabetes mellitus with diabetic chronic kidney disease: Secondary | ICD-10-CM

## 2016-02-07 DIAGNOSIS — I6350 Cerebral infarction due to unspecified occlusion or stenosis of unspecified cerebral artery: Secondary | ICD-10-CM

## 2016-02-07 NOTE — Patient Instructions (Signed)
1.  Continue aspirin and Plavix for cardiac and stroke prevention 2.  Continue Lipitor 40mg  daily as managed by your PCP  3.  Glycemic control 4.  Blood pressure control 5.  Keppra 1000mg  daily and 500mg  following dialysis 6.  Follow up in 9 months.

## 2016-02-07 NOTE — Progress Notes (Signed)
Chart forwarded.  

## 2016-02-07 NOTE — Progress Notes (Signed)
NEUROLOGY FOLLOW UP OFFICE NOTE  SHANDON LEYS [id number]  HISTORY OF PRESENT ILLNESS: Ernest [name] is a 70 year old right-handed man with hyperlipidemia, ESRD on HD (M/W/F), type II diabetes mellitus, hypertension, COPD, gout, chronic CHF (EF 30-35%), coronary artery disease, hepatitis C, penile implant (unable to get MRI) and history of stroke who follows up for stroke and seizures.    UPDATE: He presented to the ED on 4/17 and 01/31/16.  He reported dizziness with nausea and vomiting.  He says he fell due to dizziness and generalized weakness.  He was unable to finish dialysis.  Blood pressure was elevated at 146/107.  Labs showed baseline CMP, baseline CBC with mild anemia, normal lactate and negative troponins.  EKG showed no new or acute abnormalities.  Physical exam was notable for slight right pronator drift and dysmetria.  CT of head showed no acute intracranial process.  Hospital admission was not thought necessary as he was already on maximum secondary stroke prevention therapy and he was discharged in stable condition.  He returned to the ED the following day after waking up with spinning sensation.  He was given an anti-emeti.  Repeat head CT was stable.  He felt better and was subsequently discharged.  He has not had a recurrence.  HISTORY: Around October 2015, he developed slurred speech.  He also experienced ringing in his right ear, with sensation of water in his right ear.  He did not seek medical attention at the time.  There was no facial droop or difficulty walking or swallowing.  He decided to go to the ED on 09/21/14 for an evaluation as the slurred speech and tinnitus persisted.  He denied weakness but ED exam revealed right arm weakness.  CT of the head was performed, which revealed chronic small vessel ischemic changes, including remote right cerebellar, left pontine and right basal ganglia lacunar infarcts.  No acute infarction was seen.    On 04/25/15, he developed right  hand cramping and dystonia.  When EMS arrived, he had jerking of the right hand that then generalized to a tonic-clonic seizure.  He had two other seizures, in the ambulance and in the ED, lasting 30-40 seconds.  Blood sugar was 557. Bicarbonate was 25.  Cr clearance 9.86 mL/min.  CT of the head showed chronic small vessel disease and moderate brain atrophy.  He was started on Keppra 500mg  twice daily.   He was admitted to [facility] on 04/29/15 for seizure, which was thought to be secondary to hyperglycemia with a blood glucose level of 662.  MRI of the brain was withheld because he had a penile implant that contains metal.  Keppra was increased to 1000mg  daily with 500mg  following each dialysis.  He was re-admitted to [facility] again on 07/11/15 for presumed acute right pontine infarct.  He presented with diplopia and unsteady gait.  On exam, he was found to have one and a half syndrome.  CT of head and repeat CT of head did not reveal acute infarct. 2D echo showed EF 45-50% with grade 1 diastolic dysfunction.  Carotid doppler showed no hemodynamically significant ICA stenosis.  LDL was 36.  Hgb A1c was 7.7.  HIV antibody was nonreactive.  He was discharged on ASA 81mg  and Plavix daily.  Vertigo has improved with vestibular rehabilitation.  PAST MEDICAL HISTORY: Past Medical History  Diagnosis Date  . Hyperlipidemia   . Retinopathy   . Gout   . ESRD (end stage renal disease) ([place])  Started HD in [place] in 2009, ESRD was due to DM. Moved to [facility] in Dec 2009 and now gets dialysis at [facility] on a MWF schedule.     . Erectile dysfunction     penile implant  . Abnormal stress test     s/p cath November 2013 with modest disease involving the ostial left main, proximal LAD, proximal RCA - do not appear to be hemodynamically signficant; mild LV dysfunction  . Obesity   . Hypertension   . Hepatitis C   . COPD (chronic obstructive pulmonary disease) ([place])   . Coronary artery disease     per cath  report 2013  . Ejection fraction < 50%   . Chronic systolic CHF (congestive heart failure) ([place])   . Bacteremia   . Shortness of breath     Hx: of with exertion  . Gout     Hx: of  . Stroke ([place])   . Type II or unspecified type diabetes mellitus without mention of complication, not stated as uncontrolled     adult onset    MEDICATIONS: Current Outpatient Prescriptions on File Prior to Visit  Medication Sig Dispense Refill  . ACCU-CHEK FASTCLIX LANCETS MISC Check blood sugar 4 times a day before meals and bedtime 102 each 11  . allopurinol (ZYLOPRIM) 100 MG tablet Take 1 tablet (100 mg total) by mouth daily. 30 tablet 2  . amitriptyline (ELAVIL) 25 MG tablet TAKE ONE TABLET BY MOUTH AT BEDTIME FOR  NEUROPATHY 90 tablet 1  . aspirin EC 81 MG tablet Take 1 tablet (81 mg total) by mouth daily. 30 tablet 11  . atorvastatin (LIPITOR) 40 MG tablet Take 1 tablet (40 mg total) by mouth daily. 30 tablet 11  . cinacalcet (SENSIPAR) 60 MG tablet Take 1 tablet (60 mg total) by mouth 2 (two) times daily. 60 tablet 5  . clopidogrel (PLAVIX) 75 MG tablet Take 1 tablet (75 mg total) by mouth daily. 30 tablet 11  . colchicine 0.6 MG tablet 1 per day on Monday and Friday 30 tablet 2  . glucose blood (ACCU-CHEK AVIVA PLUS) test strip Check blood sugar 4 times a day before meals and bedtime 125 each 11  . insulin glargine (LANTUS) 100 UNIT/ML injection Inject 0.1 mLs (10 Units total) into the skin at bedtime. 10 mL 11  . insulin lispro (HUMALOG) 100 UNIT/ML KiwkPen Inject 0.03 mLs (3 Units total) into the skin 3 (three) times daily with meals. 15 mL 5  . Insulin Pen Needle 32G X 5 MM MISC Please use 1 pen needle for each injection 100 each 12  . levETIRAcetam (KEPPRA) 500 MG tablet Take 2 tablets (1,000 mg total) by mouth daily. Then take extra 500 mg (1 tablet) on MWF after HD 72 tablet 5  . lisinopril (PRINIVIL,ZESTRIL) 20 MG tablet Take 1 tablet (20 mg total) by mouth at bedtime. 30 tablet 5  .  metoprolol tartrate (LOPRESSOR) 25 MG tablet Take 1 tablet (25 mg total) by mouth 2 (two) times daily. 60 tablet 5  . multivitamin (RENA-VIT) TABS tablet Take 1 tablet by mouth daily. 30 tablet 11  . sevelamer carbonate (RENVELA) 800 MG tablet Take 1 tablet (800 mg total) by mouth 3 (three) times daily with meals. 90 tablet 1   No current facility-administered medications on file prior to visit.    ALLERGIES: [name]  FAMILY HISTORY: Family History  Problem Relation Age of Onset  . Heart disease Father   . Diabetes  Sister   . Diabetes Brother   . Diabetes Son     SOCIAL HISTORY: Social History   Social History  . Marital Status: Married    Spouse Name: N/A  . Number of Children: N/A  . Years of Education: N/A   Occupational History  . 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 Smoking status: Former Smoker -- 7 years    Quit date: 06/10/1973  . Smokeless tobacco: Never Used     Comment: Quit in 1974.  [name] Alcohol Use: No  . Drug Use: No  . Sexual Activity: Not on file   Other Topics Concern  . Not on file   Social History Narrative   Adopted mentally retarded child at home, 2 adopted children, 3 living and 2 deceased.    REVIEW OF SYSTEMS: Constitutional: No fevers, chills, or sweats, no generalized fatigue, change in appetite Eyes: No visual changes, double vision, eye pain Ear, nose and throat: No hearing loss, ear pain, nasal congestion, sore throat Cardiovascular: No chest pain, palpitations Respiratory:  No shortness of breath at rest or with exertion, wheezes GastrointestinaI: No nausea, vomiting, diarrhea, abdominal pain, fecal incontinence Genitourinary:  No dysuria, urinary retention or frequency Musculoskeletal:  No neck pain, back pain Integumentary: No rash, pruritus, skin lesions Neurological: as above Psychiatric: No depression, insomnia, anxiety Endocrine: No palpitations, fatigue, diaphoresis, mood swings, change in  appetite, change in weight, increased thirst Hematologic/Lymphatic:  No anemia, purpura, petechiae. Allergic/Immunologic: no itchy/runny eyes, nasal congestion, recent allergic reactions, rashes  PHYSICAL EXAM: Filed Vitals:   02/07/16 1321  BP: 134/68  Pulse: 62   General: No acute distress.   Head:  Normocephalic/atraumatic Eyes:  Fundi examined but not visualized Neck: supple, no paraspinal tenderness, full range of motion Heart:  Regular rate and rhythm Lungs:  Clear to auscultation bilaterally Back: No paraspinal tenderness Neurological Exam: alert and oriented to person, place, and time. Attention span and concentration intact, recent and remote memory intact, fund of knowledge intact.  Speech fluent and not dysarthric, language intact.  Tracking is inconsistent with horizontal eye movements bilaterally.  Otherwise, CN II-XII intact. Bulk and tone normal, muscle strength 5/5 throughout.  Reduced vibration in feet.  Deep tendon reflexes 1+ throughout except absent in ankles, toes downgoing.  Finger to nose testing intact but with tremor.  Gait mildly wide-based.  IMPRESSION: Probable BPPV Probable right pontine infarct Symptomatic localization related seizure with secondary generalization Uncontrolled type 2 diabetes Hyperlipidemia  PLAN: ASA and Plavix for cardiac and stroke prevention Lipitor as managed by PCP (LDL goal should be less than 70) Glycemic control with Hgb A1c goal less than 6.5 Blood pressure control Mediterranean diet Keppra 1000mg  daily and 500mg  following dialysis Follow up in 9 months  Metta Clines, DO  CC: Albin Felling, MD

## 2016-02-08 DIAGNOSIS — D509 Iron deficiency anemia, unspecified: Secondary | ICD-10-CM | POA: Diagnosis not present

## 2016-02-08 DIAGNOSIS — E119 Type 2 diabetes mellitus without complications: Secondary | ICD-10-CM | POA: Diagnosis not present

## 2016-02-08 DIAGNOSIS — D631 Anemia in chronic kidney disease: Secondary | ICD-10-CM | POA: Diagnosis not present

## 2016-02-08 DIAGNOSIS — N186 End stage renal disease: Secondary | ICD-10-CM | POA: Diagnosis not present

## 2016-02-08 DIAGNOSIS — E1129 Type 2 diabetes mellitus with other diabetic kidney complication: Secondary | ICD-10-CM | POA: Diagnosis not present

## 2016-02-08 DIAGNOSIS — N2581 Secondary hyperparathyroidism of renal origin: Secondary | ICD-10-CM | POA: Diagnosis not present

## 2016-02-10 DIAGNOSIS — E119 Type 2 diabetes mellitus without complications: Secondary | ICD-10-CM | POA: Diagnosis not present

## 2016-02-10 DIAGNOSIS — D509 Iron deficiency anemia, unspecified: Secondary | ICD-10-CM | POA: Diagnosis not present

## 2016-02-10 DIAGNOSIS — D631 Anemia in chronic kidney disease: Secondary | ICD-10-CM | POA: Diagnosis not present

## 2016-02-10 DIAGNOSIS — N2581 Secondary hyperparathyroidism of renal origin: Secondary | ICD-10-CM | POA: Diagnosis not present

## 2016-02-10 DIAGNOSIS — N186 End stage renal disease: Secondary | ICD-10-CM | POA: Diagnosis not present

## 2016-02-12 DIAGNOSIS — N186 End stage renal disease: Secondary | ICD-10-CM | POA: Diagnosis not present

## 2016-02-12 DIAGNOSIS — Z992 Dependence on renal dialysis: Secondary | ICD-10-CM | POA: Diagnosis not present

## 2016-02-12 DIAGNOSIS — E1129 Type 2 diabetes mellitus with other diabetic kidney complication: Secondary | ICD-10-CM | POA: Diagnosis not present

## 2016-02-13 DIAGNOSIS — N2581 Secondary hyperparathyroidism of renal origin: Secondary | ICD-10-CM | POA: Diagnosis not present

## 2016-02-13 DIAGNOSIS — D509 Iron deficiency anemia, unspecified: Secondary | ICD-10-CM | POA: Diagnosis not present

## 2016-02-13 DIAGNOSIS — N186 End stage renal disease: Secondary | ICD-10-CM | POA: Diagnosis not present

## 2016-02-13 DIAGNOSIS — D631 Anemia in chronic kidney disease: Secondary | ICD-10-CM | POA: Diagnosis not present

## 2016-02-13 DIAGNOSIS — E119 Type 2 diabetes mellitus without complications: Secondary | ICD-10-CM | POA: Diagnosis not present

## 2016-02-14 ENCOUNTER — Ambulatory Visit (INDEPENDENT_AMBULATORY_CARE_PROVIDER_SITE_OTHER): Payer: Medicare Other | Admitting: Podiatry

## 2016-02-14 ENCOUNTER — Encounter: Payer: Self-pay | Admitting: Podiatry

## 2016-02-14 DIAGNOSIS — M79673 Pain in unspecified foot: Secondary | ICD-10-CM

## 2016-02-14 DIAGNOSIS — E119 Type 2 diabetes mellitus without complications: Secondary | ICD-10-CM | POA: Diagnosis not present

## 2016-02-14 DIAGNOSIS — B351 Tinea unguium: Secondary | ICD-10-CM | POA: Diagnosis not present

## 2016-02-14 DIAGNOSIS — Z794 Long term (current) use of insulin: Secondary | ICD-10-CM

## 2016-02-14 NOTE — Progress Notes (Signed)
Patient ID: Ernest [name], male   DOB: [birth year], 70 y.o.   MRN: [id number] Complaint:  Visit Type: Patient returns to my office for continued preventative foot care services. Complaint: Patient states" my nails have grown long and thick and become painful to walk and wear shoes" Patient has been diagnosed with DM with no foot  complications. He presents for preventative foot care services. No changes to ROS  Podiatric Exam: Vascular: dorsalis pedis and posterior tibial pulses are palpable bilateral. Capillary return is immediate. Temperature gradient is WNL. Skin turgor WNL  Sensorium: Normal Semmes Weinstein monofilament test. Normal tactile sensation bilaterally. Nail Exam: Pt has thick disfigured discolored nails with subungual debris noted bilateral entire nail hallux through fifth toenails Ulcer Exam: There is no evidence of ulcer or pre-ulcerative changes or infection. Orthopedic Exam: Muscle tone and strength are WNL. No limitations in general ROM. No crepitus or effusions noted. Foot type and digits show no abnormalities. Bony prominences are unremarkable. Skin: No Porokeratosis. No infection or ulcers  Diagnosis:  Tinea unguium, Pain in right toe, pain in left toes  Treatment & Plan Procedures and Treatment: Consent by patient was obtained for treatment procedures. The patient understood the discussion of treatment and procedures well. All questions were answered thoroughly reviewed. Debridement of mycotic and hypertrophic toenails, 1 through 5 bilateral and clearing of subungual debris. No ulceration, no infection noted.  Return Visit-Office Procedure: Patient instructed to return to the office for a follow up visit 3 months for continued evaluation and treatment.   Gardiner Barefoot DPM

## 2016-02-15 DIAGNOSIS — N2581 Secondary hyperparathyroidism of renal origin: Secondary | ICD-10-CM | POA: Diagnosis not present

## 2016-02-15 DIAGNOSIS — N186 End stage renal disease: Secondary | ICD-10-CM | POA: Diagnosis not present

## 2016-02-15 DIAGNOSIS — E119 Type 2 diabetes mellitus without complications: Secondary | ICD-10-CM | POA: Diagnosis not present

## 2016-02-15 DIAGNOSIS — D509 Iron deficiency anemia, unspecified: Secondary | ICD-10-CM | POA: Diagnosis not present

## 2016-02-15 DIAGNOSIS — D631 Anemia in chronic kidney disease: Secondary | ICD-10-CM | POA: Diagnosis not present

## 2016-02-17 DIAGNOSIS — D631 Anemia in chronic kidney disease: Secondary | ICD-10-CM | POA: Diagnosis not present

## 2016-02-17 DIAGNOSIS — D509 Iron deficiency anemia, unspecified: Secondary | ICD-10-CM | POA: Diagnosis not present

## 2016-02-17 DIAGNOSIS — N2581 Secondary hyperparathyroidism of renal origin: Secondary | ICD-10-CM | POA: Diagnosis not present

## 2016-02-17 DIAGNOSIS — N186 End stage renal disease: Secondary | ICD-10-CM | POA: Diagnosis not present

## 2016-02-17 DIAGNOSIS — E119 Type 2 diabetes mellitus without complications: Secondary | ICD-10-CM | POA: Diagnosis not present

## 2016-02-20 DIAGNOSIS — E119 Type 2 diabetes mellitus without complications: Secondary | ICD-10-CM | POA: Diagnosis not present

## 2016-02-20 DIAGNOSIS — N2581 Secondary hyperparathyroidism of renal origin: Secondary | ICD-10-CM | POA: Diagnosis not present

## 2016-02-20 DIAGNOSIS — D509 Iron deficiency anemia, unspecified: Secondary | ICD-10-CM | POA: Diagnosis not present

## 2016-02-20 DIAGNOSIS — N186 End stage renal disease: Secondary | ICD-10-CM | POA: Diagnosis not present

## 2016-02-20 DIAGNOSIS — D631 Anemia in chronic kidney disease: Secondary | ICD-10-CM | POA: Diagnosis not present

## 2016-02-22 DIAGNOSIS — N2581 Secondary hyperparathyroidism of renal origin: Secondary | ICD-10-CM | POA: Diagnosis not present

## 2016-02-22 DIAGNOSIS — D631 Anemia in chronic kidney disease: Secondary | ICD-10-CM | POA: Diagnosis not present

## 2016-02-22 DIAGNOSIS — E119 Type 2 diabetes mellitus without complications: Secondary | ICD-10-CM | POA: Diagnosis not present

## 2016-02-22 DIAGNOSIS — D509 Iron deficiency anemia, unspecified: Secondary | ICD-10-CM | POA: Diagnosis not present

## 2016-02-22 DIAGNOSIS — N186 End stage renal disease: Secondary | ICD-10-CM | POA: Diagnosis not present

## 2016-02-24 DIAGNOSIS — D631 Anemia in chronic kidney disease: Secondary | ICD-10-CM | POA: Diagnosis not present

## 2016-02-24 DIAGNOSIS — E119 Type 2 diabetes mellitus without complications: Secondary | ICD-10-CM | POA: Diagnosis not present

## 2016-02-24 DIAGNOSIS — N2581 Secondary hyperparathyroidism of renal origin: Secondary | ICD-10-CM | POA: Diagnosis not present

## 2016-02-24 DIAGNOSIS — N186 End stage renal disease: Secondary | ICD-10-CM | POA: Diagnosis not present

## 2016-02-24 DIAGNOSIS — D509 Iron deficiency anemia, unspecified: Secondary | ICD-10-CM | POA: Diagnosis not present

## 2016-02-27 DIAGNOSIS — D631 Anemia in chronic kidney disease: Secondary | ICD-10-CM | POA: Diagnosis not present

## 2016-02-27 DIAGNOSIS — N2581 Secondary hyperparathyroidism of renal origin: Secondary | ICD-10-CM | POA: Diagnosis not present

## 2016-02-27 DIAGNOSIS — E119 Type 2 diabetes mellitus without complications: Secondary | ICD-10-CM | POA: Diagnosis not present

## 2016-02-27 DIAGNOSIS — D509 Iron deficiency anemia, unspecified: Secondary | ICD-10-CM | POA: Diagnosis not present

## 2016-02-27 DIAGNOSIS — N186 End stage renal disease: Secondary | ICD-10-CM | POA: Diagnosis not present

## 2016-02-29 DIAGNOSIS — E119 Type 2 diabetes mellitus without complications: Secondary | ICD-10-CM | POA: Diagnosis not present

## 2016-02-29 DIAGNOSIS — N2581 Secondary hyperparathyroidism of renal origin: Secondary | ICD-10-CM | POA: Diagnosis not present

## 2016-02-29 DIAGNOSIS — D631 Anemia in chronic kidney disease: Secondary | ICD-10-CM | POA: Diagnosis not present

## 2016-02-29 DIAGNOSIS — N186 End stage renal disease: Secondary | ICD-10-CM | POA: Diagnosis not present

## 2016-02-29 DIAGNOSIS — D509 Iron deficiency anemia, unspecified: Secondary | ICD-10-CM | POA: Diagnosis not present

## 2016-03-02 DIAGNOSIS — N186 End stage renal disease: Secondary | ICD-10-CM | POA: Diagnosis not present

## 2016-03-02 DIAGNOSIS — D509 Iron deficiency anemia, unspecified: Secondary | ICD-10-CM | POA: Diagnosis not present

## 2016-03-02 DIAGNOSIS — E119 Type 2 diabetes mellitus without complications: Secondary | ICD-10-CM | POA: Diagnosis not present

## 2016-03-02 DIAGNOSIS — D631 Anemia in chronic kidney disease: Secondary | ICD-10-CM | POA: Diagnosis not present

## 2016-03-02 DIAGNOSIS — N2581 Secondary hyperparathyroidism of renal origin: Secondary | ICD-10-CM | POA: Diagnosis not present

## 2016-03-05 DIAGNOSIS — E119 Type 2 diabetes mellitus without complications: Secondary | ICD-10-CM | POA: Diagnosis not present

## 2016-03-05 DIAGNOSIS — N186 End stage renal disease: Secondary | ICD-10-CM | POA: Diagnosis not present

## 2016-03-05 DIAGNOSIS — D631 Anemia in chronic kidney disease: Secondary | ICD-10-CM | POA: Diagnosis not present

## 2016-03-05 DIAGNOSIS — N2581 Secondary hyperparathyroidism of renal origin: Secondary | ICD-10-CM | POA: Diagnosis not present

## 2016-03-05 DIAGNOSIS — D509 Iron deficiency anemia, unspecified: Secondary | ICD-10-CM | POA: Diagnosis not present

## 2016-03-07 DIAGNOSIS — E119 Type 2 diabetes mellitus without complications: Secondary | ICD-10-CM | POA: Diagnosis not present

## 2016-03-07 DIAGNOSIS — N2581 Secondary hyperparathyroidism of renal origin: Secondary | ICD-10-CM | POA: Diagnosis not present

## 2016-03-07 DIAGNOSIS — D509 Iron deficiency anemia, unspecified: Secondary | ICD-10-CM | POA: Diagnosis not present

## 2016-03-07 DIAGNOSIS — D631 Anemia in chronic kidney disease: Secondary | ICD-10-CM | POA: Diagnosis not present

## 2016-03-07 DIAGNOSIS — N186 End stage renal disease: Secondary | ICD-10-CM | POA: Diagnosis not present

## 2016-03-09 DIAGNOSIS — N2581 Secondary hyperparathyroidism of renal origin: Secondary | ICD-10-CM | POA: Diagnosis not present

## 2016-03-09 DIAGNOSIS — D509 Iron deficiency anemia, unspecified: Secondary | ICD-10-CM | POA: Diagnosis not present

## 2016-03-09 DIAGNOSIS — E119 Type 2 diabetes mellitus without complications: Secondary | ICD-10-CM | POA: Diagnosis not present

## 2016-03-09 DIAGNOSIS — N186 End stage renal disease: Secondary | ICD-10-CM | POA: Diagnosis not present

## 2016-03-09 DIAGNOSIS — D631 Anemia in chronic kidney disease: Secondary | ICD-10-CM | POA: Diagnosis not present

## 2016-03-12 DIAGNOSIS — N186 End stage renal disease: Secondary | ICD-10-CM | POA: Diagnosis not present

## 2016-03-12 DIAGNOSIS — E119 Type 2 diabetes mellitus without complications: Secondary | ICD-10-CM | POA: Diagnosis not present

## 2016-03-12 DIAGNOSIS — D631 Anemia in chronic kidney disease: Secondary | ICD-10-CM | POA: Diagnosis not present

## 2016-03-12 DIAGNOSIS — N2581 Secondary hyperparathyroidism of renal origin: Secondary | ICD-10-CM | POA: Diagnosis not present

## 2016-03-12 DIAGNOSIS — D509 Iron deficiency anemia, unspecified: Secondary | ICD-10-CM | POA: Diagnosis not present

## 2016-03-14 DIAGNOSIS — E119 Type 2 diabetes mellitus without complications: Secondary | ICD-10-CM | POA: Diagnosis not present

## 2016-03-14 DIAGNOSIS — E1129 Type 2 diabetes mellitus with other diabetic kidney complication: Secondary | ICD-10-CM | POA: Diagnosis not present

## 2016-03-14 DIAGNOSIS — D509 Iron deficiency anemia, unspecified: Secondary | ICD-10-CM | POA: Diagnosis not present

## 2016-03-14 DIAGNOSIS — Z992 Dependence on renal dialysis: Secondary | ICD-10-CM | POA: Diagnosis not present

## 2016-03-14 DIAGNOSIS — D631 Anemia in chronic kidney disease: Secondary | ICD-10-CM | POA: Diagnosis not present

## 2016-03-14 DIAGNOSIS — N186 End stage renal disease: Secondary | ICD-10-CM | POA: Diagnosis not present

## 2016-03-14 DIAGNOSIS — N2581 Secondary hyperparathyroidism of renal origin: Secondary | ICD-10-CM | POA: Diagnosis not present

## 2016-03-16 DIAGNOSIS — D509 Iron deficiency anemia, unspecified: Secondary | ICD-10-CM | POA: Diagnosis not present

## 2016-03-16 DIAGNOSIS — N2581 Secondary hyperparathyroidism of renal origin: Secondary | ICD-10-CM | POA: Diagnosis not present

## 2016-03-16 DIAGNOSIS — E119 Type 2 diabetes mellitus without complications: Secondary | ICD-10-CM | POA: Diagnosis not present

## 2016-03-16 DIAGNOSIS — N186 End stage renal disease: Secondary | ICD-10-CM | POA: Diagnosis not present

## 2016-03-16 DIAGNOSIS — D631 Anemia in chronic kidney disease: Secondary | ICD-10-CM | POA: Diagnosis not present

## 2016-03-19 DIAGNOSIS — E119 Type 2 diabetes mellitus without complications: Secondary | ICD-10-CM | POA: Diagnosis not present

## 2016-03-19 DIAGNOSIS — N186 End stage renal disease: Secondary | ICD-10-CM | POA: Diagnosis not present

## 2016-03-19 DIAGNOSIS — D509 Iron deficiency anemia, unspecified: Secondary | ICD-10-CM | POA: Diagnosis not present

## 2016-03-19 DIAGNOSIS — D631 Anemia in chronic kidney disease: Secondary | ICD-10-CM | POA: Diagnosis not present

## 2016-03-19 DIAGNOSIS — N2581 Secondary hyperparathyroidism of renal origin: Secondary | ICD-10-CM | POA: Diagnosis not present

## 2016-03-21 DIAGNOSIS — N186 End stage renal disease: Secondary | ICD-10-CM | POA: Diagnosis not present

## 2016-03-21 DIAGNOSIS — D509 Iron deficiency anemia, unspecified: Secondary | ICD-10-CM | POA: Diagnosis not present

## 2016-03-21 DIAGNOSIS — N2581 Secondary hyperparathyroidism of renal origin: Secondary | ICD-10-CM | POA: Diagnosis not present

## 2016-03-21 DIAGNOSIS — E119 Type 2 diabetes mellitus without complications: Secondary | ICD-10-CM | POA: Diagnosis not present

## 2016-03-21 DIAGNOSIS — D631 Anemia in chronic kidney disease: Secondary | ICD-10-CM | POA: Diagnosis not present

## 2016-03-23 DIAGNOSIS — N186 End stage renal disease: Secondary | ICD-10-CM | POA: Diagnosis not present

## 2016-03-23 DIAGNOSIS — D509 Iron deficiency anemia, unspecified: Secondary | ICD-10-CM | POA: Diagnosis not present

## 2016-03-23 DIAGNOSIS — D631 Anemia in chronic kidney disease: Secondary | ICD-10-CM | POA: Diagnosis not present

## 2016-03-23 DIAGNOSIS — N2581 Secondary hyperparathyroidism of renal origin: Secondary | ICD-10-CM | POA: Diagnosis not present

## 2016-03-23 DIAGNOSIS — E119 Type 2 diabetes mellitus without complications: Secondary | ICD-10-CM | POA: Diagnosis not present

## 2016-03-26 DIAGNOSIS — N186 End stage renal disease: Secondary | ICD-10-CM | POA: Diagnosis not present

## 2016-03-26 DIAGNOSIS — D509 Iron deficiency anemia, unspecified: Secondary | ICD-10-CM | POA: Diagnosis not present

## 2016-03-26 DIAGNOSIS — E119 Type 2 diabetes mellitus without complications: Secondary | ICD-10-CM | POA: Diagnosis not present

## 2016-03-26 DIAGNOSIS — N2581 Secondary hyperparathyroidism of renal origin: Secondary | ICD-10-CM | POA: Diagnosis not present

## 2016-03-26 DIAGNOSIS — D631 Anemia in chronic kidney disease: Secondary | ICD-10-CM | POA: Diagnosis not present

## 2016-03-28 DIAGNOSIS — D509 Iron deficiency anemia, unspecified: Secondary | ICD-10-CM | POA: Diagnosis not present

## 2016-03-28 DIAGNOSIS — E119 Type 2 diabetes mellitus without complications: Secondary | ICD-10-CM | POA: Diagnosis not present

## 2016-03-28 DIAGNOSIS — N2581 Secondary hyperparathyroidism of renal origin: Secondary | ICD-10-CM | POA: Diagnosis not present

## 2016-03-28 DIAGNOSIS — D631 Anemia in chronic kidney disease: Secondary | ICD-10-CM | POA: Diagnosis not present

## 2016-03-28 DIAGNOSIS — N186 End stage renal disease: Secondary | ICD-10-CM | POA: Diagnosis not present

## 2016-03-30 DIAGNOSIS — N186 End stage renal disease: Secondary | ICD-10-CM | POA: Diagnosis not present

## 2016-03-30 DIAGNOSIS — D631 Anemia in chronic kidney disease: Secondary | ICD-10-CM | POA: Diagnosis not present

## 2016-03-30 DIAGNOSIS — N2581 Secondary hyperparathyroidism of renal origin: Secondary | ICD-10-CM | POA: Diagnosis not present

## 2016-03-30 DIAGNOSIS — D509 Iron deficiency anemia, unspecified: Secondary | ICD-10-CM | POA: Diagnosis not present

## 2016-03-30 DIAGNOSIS — E119 Type 2 diabetes mellitus without complications: Secondary | ICD-10-CM | POA: Diagnosis not present

## 2016-04-02 DIAGNOSIS — N186 End stage renal disease: Secondary | ICD-10-CM | POA: Diagnosis not present

## 2016-04-02 DIAGNOSIS — D509 Iron deficiency anemia, unspecified: Secondary | ICD-10-CM | POA: Diagnosis not present

## 2016-04-02 DIAGNOSIS — E119 Type 2 diabetes mellitus without complications: Secondary | ICD-10-CM | POA: Diagnosis not present

## 2016-04-02 DIAGNOSIS — D631 Anemia in chronic kidney disease: Secondary | ICD-10-CM | POA: Diagnosis not present

## 2016-04-02 DIAGNOSIS — N2581 Secondary hyperparathyroidism of renal origin: Secondary | ICD-10-CM | POA: Diagnosis not present

## 2016-04-04 DIAGNOSIS — D509 Iron deficiency anemia, unspecified: Secondary | ICD-10-CM | POA: Diagnosis not present

## 2016-04-04 DIAGNOSIS — N186 End stage renal disease: Secondary | ICD-10-CM | POA: Diagnosis not present

## 2016-04-04 DIAGNOSIS — N2581 Secondary hyperparathyroidism of renal origin: Secondary | ICD-10-CM | POA: Diagnosis not present

## 2016-04-04 DIAGNOSIS — D631 Anemia in chronic kidney disease: Secondary | ICD-10-CM | POA: Diagnosis not present

## 2016-04-04 DIAGNOSIS — E119 Type 2 diabetes mellitus without complications: Secondary | ICD-10-CM | POA: Diagnosis not present

## 2016-04-06 DIAGNOSIS — E119 Type 2 diabetes mellitus without complications: Secondary | ICD-10-CM | POA: Diagnosis not present

## 2016-04-06 DIAGNOSIS — D631 Anemia in chronic kidney disease: Secondary | ICD-10-CM | POA: Diagnosis not present

## 2016-04-06 DIAGNOSIS — D509 Iron deficiency anemia, unspecified: Secondary | ICD-10-CM | POA: Diagnosis not present

## 2016-04-06 DIAGNOSIS — N2581 Secondary hyperparathyroidism of renal origin: Secondary | ICD-10-CM | POA: Diagnosis not present

## 2016-04-06 DIAGNOSIS — N186 End stage renal disease: Secondary | ICD-10-CM | POA: Diagnosis not present

## 2016-04-09 DIAGNOSIS — D509 Iron deficiency anemia, unspecified: Secondary | ICD-10-CM | POA: Diagnosis not present

## 2016-04-09 DIAGNOSIS — N2581 Secondary hyperparathyroidism of renal origin: Secondary | ICD-10-CM | POA: Diagnosis not present

## 2016-04-09 DIAGNOSIS — D631 Anemia in chronic kidney disease: Secondary | ICD-10-CM | POA: Diagnosis not present

## 2016-04-09 DIAGNOSIS — E119 Type 2 diabetes mellitus without complications: Secondary | ICD-10-CM | POA: Diagnosis not present

## 2016-04-09 DIAGNOSIS — N186 End stage renal disease: Secondary | ICD-10-CM | POA: Diagnosis not present

## 2016-04-11 DIAGNOSIS — D509 Iron deficiency anemia, unspecified: Secondary | ICD-10-CM | POA: Diagnosis not present

## 2016-04-11 DIAGNOSIS — E119 Type 2 diabetes mellitus without complications: Secondary | ICD-10-CM | POA: Diagnosis not present

## 2016-04-11 DIAGNOSIS — N2581 Secondary hyperparathyroidism of renal origin: Secondary | ICD-10-CM | POA: Diagnosis not present

## 2016-04-11 DIAGNOSIS — D631 Anemia in chronic kidney disease: Secondary | ICD-10-CM | POA: Diagnosis not present

## 2016-04-11 DIAGNOSIS — N186 End stage renal disease: Secondary | ICD-10-CM | POA: Diagnosis not present

## 2016-04-13 DIAGNOSIS — E1129 Type 2 diabetes mellitus with other diabetic kidney complication: Secondary | ICD-10-CM | POA: Diagnosis not present

## 2016-04-13 DIAGNOSIS — D631 Anemia in chronic kidney disease: Secondary | ICD-10-CM | POA: Diagnosis not present

## 2016-04-13 DIAGNOSIS — D509 Iron deficiency anemia, unspecified: Secondary | ICD-10-CM | POA: Diagnosis not present

## 2016-04-13 DIAGNOSIS — N186 End stage renal disease: Secondary | ICD-10-CM | POA: Diagnosis not present

## 2016-04-13 DIAGNOSIS — N2581 Secondary hyperparathyroidism of renal origin: Secondary | ICD-10-CM | POA: Diagnosis not present

## 2016-04-13 DIAGNOSIS — Z992 Dependence on renal dialysis: Secondary | ICD-10-CM | POA: Diagnosis not present

## 2016-04-13 DIAGNOSIS — E119 Type 2 diabetes mellitus without complications: Secondary | ICD-10-CM | POA: Diagnosis not present

## 2016-04-16 ENCOUNTER — Other Ambulatory Visit: Payer: Self-pay | Admitting: Internal Medicine

## 2016-04-16 DIAGNOSIS — N2581 Secondary hyperparathyroidism of renal origin: Secondary | ICD-10-CM | POA: Diagnosis not present

## 2016-04-16 DIAGNOSIS — E1165 Type 2 diabetes mellitus with hyperglycemia: Principal | ICD-10-CM

## 2016-04-16 DIAGNOSIS — IMO0002 Reserved for concepts with insufficient information to code with codable children: Secondary | ICD-10-CM

## 2016-04-16 DIAGNOSIS — D509 Iron deficiency anemia, unspecified: Secondary | ICD-10-CM | POA: Diagnosis not present

## 2016-04-16 DIAGNOSIS — D631 Anemia in chronic kidney disease: Secondary | ICD-10-CM | POA: Diagnosis not present

## 2016-04-16 DIAGNOSIS — N186 End stage renal disease: Secondary | ICD-10-CM | POA: Diagnosis not present

## 2016-04-16 DIAGNOSIS — E1122 Type 2 diabetes mellitus with diabetic chronic kidney disease: Secondary | ICD-10-CM

## 2016-04-16 DIAGNOSIS — E119 Type 2 diabetes mellitus without complications: Secondary | ICD-10-CM | POA: Diagnosis not present

## 2016-04-16 DIAGNOSIS — M109 Gout, unspecified: Secondary | ICD-10-CM

## 2016-04-16 MED ORDER — AMITRIPTYLINE HCL 25 MG PO TABS: ORAL_TABLET | ORAL | Status: DC

## 2016-04-16 MED ORDER — ALLOPURINOL 100 MG PO TABS: 100.0000 mg | ORAL_TABLET | Freq: Every day | ORAL | Status: DC

## 2016-04-16 MED ORDER — COLCHICINE 0.6 MG PO TABS: ORAL_TABLET | ORAL | Status: DC

## 2016-04-16 NOTE — Telephone Encounter (Signed)
amitriptyline (ELAVIL) 25 MG tablet allopurinol (ZYLOPRIM) 100 MG tablet colchicine 0.6 MG tablet Mail in [facility]  [phone]

## 2016-04-18 DIAGNOSIS — N2581 Secondary hyperparathyroidism of renal origin: Secondary | ICD-10-CM | POA: Diagnosis not present

## 2016-04-18 DIAGNOSIS — D631 Anemia in chronic kidney disease: Secondary | ICD-10-CM | POA: Diagnosis not present

## 2016-04-18 DIAGNOSIS — N186 End stage renal disease: Secondary | ICD-10-CM | POA: Diagnosis not present

## 2016-04-18 DIAGNOSIS — E119 Type 2 diabetes mellitus without complications: Secondary | ICD-10-CM | POA: Diagnosis not present

## 2016-04-18 DIAGNOSIS — D509 Iron deficiency anemia, unspecified: Secondary | ICD-10-CM | POA: Diagnosis not present

## 2016-04-20 DIAGNOSIS — E119 Type 2 diabetes mellitus without complications: Secondary | ICD-10-CM | POA: Diagnosis not present

## 2016-04-20 DIAGNOSIS — N186 End stage renal disease: Secondary | ICD-10-CM | POA: Diagnosis not present

## 2016-04-20 DIAGNOSIS — N2581 Secondary hyperparathyroidism of renal origin: Secondary | ICD-10-CM | POA: Diagnosis not present

## 2016-04-20 DIAGNOSIS — D509 Iron deficiency anemia, unspecified: Secondary | ICD-10-CM | POA: Diagnosis not present

## 2016-04-20 DIAGNOSIS — D631 Anemia in chronic kidney disease: Secondary | ICD-10-CM | POA: Diagnosis not present

## 2016-04-23 DIAGNOSIS — D509 Iron deficiency anemia, unspecified: Secondary | ICD-10-CM | POA: Diagnosis not present

## 2016-04-23 DIAGNOSIS — N186 End stage renal disease: Secondary | ICD-10-CM | POA: Diagnosis not present

## 2016-04-23 DIAGNOSIS — E119 Type 2 diabetes mellitus without complications: Secondary | ICD-10-CM | POA: Diagnosis not present

## 2016-04-23 DIAGNOSIS — N2581 Secondary hyperparathyroidism of renal origin: Secondary | ICD-10-CM | POA: Diagnosis not present

## 2016-04-23 DIAGNOSIS — D631 Anemia in chronic kidney disease: Secondary | ICD-10-CM | POA: Diagnosis not present

## 2016-04-25 DIAGNOSIS — E119 Type 2 diabetes mellitus without complications: Secondary | ICD-10-CM | POA: Diagnosis not present

## 2016-04-25 DIAGNOSIS — D631 Anemia in chronic kidney disease: Secondary | ICD-10-CM | POA: Diagnosis not present

## 2016-04-25 DIAGNOSIS — N186 End stage renal disease: Secondary | ICD-10-CM | POA: Diagnosis not present

## 2016-04-25 DIAGNOSIS — N2581 Secondary hyperparathyroidism of renal origin: Secondary | ICD-10-CM | POA: Diagnosis not present

## 2016-04-25 DIAGNOSIS — D509 Iron deficiency anemia, unspecified: Secondary | ICD-10-CM | POA: Diagnosis not present

## 2016-04-27 DIAGNOSIS — N186 End stage renal disease: Secondary | ICD-10-CM | POA: Diagnosis not present

## 2016-04-27 DIAGNOSIS — D509 Iron deficiency anemia, unspecified: Secondary | ICD-10-CM | POA: Diagnosis not present

## 2016-04-27 DIAGNOSIS — E119 Type 2 diabetes mellitus without complications: Secondary | ICD-10-CM | POA: Diagnosis not present

## 2016-04-27 DIAGNOSIS — D631 Anemia in chronic kidney disease: Secondary | ICD-10-CM | POA: Diagnosis not present

## 2016-04-27 DIAGNOSIS — N2581 Secondary hyperparathyroidism of renal origin: Secondary | ICD-10-CM | POA: Diagnosis not present

## 2016-04-30 DIAGNOSIS — N2581 Secondary hyperparathyroidism of renal origin: Secondary | ICD-10-CM | POA: Diagnosis not present

## 2016-04-30 DIAGNOSIS — E119 Type 2 diabetes mellitus without complications: Secondary | ICD-10-CM | POA: Diagnosis not present

## 2016-04-30 DIAGNOSIS — D509 Iron deficiency anemia, unspecified: Secondary | ICD-10-CM | POA: Diagnosis not present

## 2016-04-30 DIAGNOSIS — N186 End stage renal disease: Secondary | ICD-10-CM | POA: Diagnosis not present

## 2016-04-30 DIAGNOSIS — D631 Anemia in chronic kidney disease: Secondary | ICD-10-CM | POA: Diagnosis not present

## 2016-05-02 DIAGNOSIS — E119 Type 2 diabetes mellitus without complications: Secondary | ICD-10-CM | POA: Diagnosis not present

## 2016-05-02 DIAGNOSIS — D509 Iron deficiency anemia, unspecified: Secondary | ICD-10-CM | POA: Diagnosis not present

## 2016-05-02 DIAGNOSIS — N2581 Secondary hyperparathyroidism of renal origin: Secondary | ICD-10-CM | POA: Diagnosis not present

## 2016-05-02 DIAGNOSIS — N186 End stage renal disease: Secondary | ICD-10-CM | POA: Diagnosis not present

## 2016-05-02 DIAGNOSIS — D631 Anemia in chronic kidney disease: Secondary | ICD-10-CM | POA: Diagnosis not present

## 2016-05-04 DIAGNOSIS — D509 Iron deficiency anemia, unspecified: Secondary | ICD-10-CM | POA: Diagnosis not present

## 2016-05-04 DIAGNOSIS — E119 Type 2 diabetes mellitus without complications: Secondary | ICD-10-CM | POA: Diagnosis not present

## 2016-05-04 DIAGNOSIS — N2581 Secondary hyperparathyroidism of renal origin: Secondary | ICD-10-CM | POA: Diagnosis not present

## 2016-05-04 DIAGNOSIS — N186 End stage renal disease: Secondary | ICD-10-CM | POA: Diagnosis not present

## 2016-05-04 DIAGNOSIS — D631 Anemia in chronic kidney disease: Secondary | ICD-10-CM | POA: Diagnosis not present

## 2016-05-07 DIAGNOSIS — N186 End stage renal disease: Secondary | ICD-10-CM | POA: Diagnosis not present

## 2016-05-07 DIAGNOSIS — D631 Anemia in chronic kidney disease: Secondary | ICD-10-CM | POA: Diagnosis not present

## 2016-05-07 DIAGNOSIS — E119 Type 2 diabetes mellitus without complications: Secondary | ICD-10-CM | POA: Diagnosis not present

## 2016-05-07 DIAGNOSIS — N2581 Secondary hyperparathyroidism of renal origin: Secondary | ICD-10-CM | POA: Diagnosis not present

## 2016-05-07 DIAGNOSIS — D509 Iron deficiency anemia, unspecified: Secondary | ICD-10-CM | POA: Diagnosis not present

## 2016-05-09 DIAGNOSIS — D631 Anemia in chronic kidney disease: Secondary | ICD-10-CM | POA: Diagnosis not present

## 2016-05-09 DIAGNOSIS — N186 End stage renal disease: Secondary | ICD-10-CM | POA: Diagnosis not present

## 2016-05-09 DIAGNOSIS — N2581 Secondary hyperparathyroidism of renal origin: Secondary | ICD-10-CM | POA: Diagnosis not present

## 2016-05-09 DIAGNOSIS — E119 Type 2 diabetes mellitus without complications: Secondary | ICD-10-CM | POA: Diagnosis not present

## 2016-05-09 DIAGNOSIS — E1129 Type 2 diabetes mellitus with other diabetic kidney complication: Secondary | ICD-10-CM | POA: Diagnosis not present

## 2016-05-09 DIAGNOSIS — D509 Iron deficiency anemia, unspecified: Secondary | ICD-10-CM | POA: Diagnosis not present

## 2016-05-11 DIAGNOSIS — D631 Anemia in chronic kidney disease: Secondary | ICD-10-CM | POA: Diagnosis not present

## 2016-05-11 DIAGNOSIS — D509 Iron deficiency anemia, unspecified: Secondary | ICD-10-CM | POA: Diagnosis not present

## 2016-05-11 DIAGNOSIS — N2581 Secondary hyperparathyroidism of renal origin: Secondary | ICD-10-CM | POA: Diagnosis not present

## 2016-05-11 DIAGNOSIS — N186 End stage renal disease: Secondary | ICD-10-CM | POA: Diagnosis not present

## 2016-05-11 DIAGNOSIS — E119 Type 2 diabetes mellitus without complications: Secondary | ICD-10-CM | POA: Diagnosis not present

## 2016-05-14 DIAGNOSIS — D631 Anemia in chronic kidney disease: Secondary | ICD-10-CM | POA: Diagnosis not present

## 2016-05-14 DIAGNOSIS — E119 Type 2 diabetes mellitus without complications: Secondary | ICD-10-CM | POA: Diagnosis not present

## 2016-05-14 DIAGNOSIS — Z992 Dependence on renal dialysis: Secondary | ICD-10-CM | POA: Diagnosis not present

## 2016-05-14 DIAGNOSIS — N2581 Secondary hyperparathyroidism of renal origin: Secondary | ICD-10-CM | POA: Diagnosis not present

## 2016-05-14 DIAGNOSIS — N186 End stage renal disease: Secondary | ICD-10-CM | POA: Diagnosis not present

## 2016-05-14 DIAGNOSIS — D509 Iron deficiency anemia, unspecified: Secondary | ICD-10-CM | POA: Diagnosis not present

## 2016-05-14 DIAGNOSIS — E1129 Type 2 diabetes mellitus with other diabetic kidney complication: Secondary | ICD-10-CM | POA: Diagnosis not present

## 2016-05-15 ENCOUNTER — Encounter: Payer: Self-pay | Admitting: Podiatry

## 2016-05-15 ENCOUNTER — Ambulatory Visit (INDEPENDENT_AMBULATORY_CARE_PROVIDER_SITE_OTHER): Payer: Medicare Other | Admitting: Podiatry

## 2016-05-15 DIAGNOSIS — Z794 Long term (current) use of insulin: Secondary | ICD-10-CM

## 2016-05-15 DIAGNOSIS — B351 Tinea unguium: Secondary | ICD-10-CM | POA: Diagnosis not present

## 2016-05-15 DIAGNOSIS — E119 Type 2 diabetes mellitus without complications: Secondary | ICD-10-CM

## 2016-05-15 DIAGNOSIS — M79673 Pain in unspecified foot: Secondary | ICD-10-CM

## 2016-05-15 NOTE — Progress Notes (Signed)
Patient ID: Ernest [name], male   DOB: 12/14/1945, 70 y.o.   MRN: [id number] Complaint:  Visit Type: Patient returns to my office for continued preventative foot care services. Complaint: Patient states" my nails have grown long and thick and become painful to walk and wear shoes" Patient has been diagnosed with DM with no foot  complications. He presents for preventative foot care services. No changes to ROS  Podiatric Exam: Vascular: dorsalis pedis and posterior tibial pulses are palpable bilateral. Capillary return is immediate. Temperature gradient is WNL. Skin turgor WNL  Sensorium: Normal Semmes Weinstein monofilament test. Normal tactile sensation bilaterally. Nail Exam: Pt has thick disfigured discolored nails with subungual debris noted bilateral entire nail hallux through fifth toenails Ulcer Exam: There is no evidence of ulcer or pre-ulcerative changes or infection. Orthopedic Exam: Muscle tone and strength are WNL. No limitations in general ROM. No crepitus or effusions noted. Foot type and digits show no abnormalities. Bony prominences are unremarkable. Skin: No Porokeratosis. No infection or ulcers  Diagnosis:  Tinea unguium, Pain in right toe, pain in left toes  Treatment & Plan Procedures and Treatment: Consent by patient was obtained for treatment procedures. The patient understood the discussion of treatment and procedures well. All questions were answered thoroughly reviewed. Debridement of mycotic and hypertrophic toenails, 1 through 5 bilateral and clearing of subungual debris. No ulceration, no infection noted.  Return Visit-Office Procedure: Patient instructed to return to the office for a follow up visit 3 months for continued evaluation and treatment.   Gardiner Barefoot DPM

## 2016-05-16 DIAGNOSIS — E119 Type 2 diabetes mellitus without complications: Secondary | ICD-10-CM | POA: Diagnosis not present

## 2016-05-16 DIAGNOSIS — D631 Anemia in chronic kidney disease: Secondary | ICD-10-CM | POA: Diagnosis not present

## 2016-05-16 DIAGNOSIS — N2581 Secondary hyperparathyroidism of renal origin: Secondary | ICD-10-CM | POA: Diagnosis not present

## 2016-05-16 DIAGNOSIS — N186 End stage renal disease: Secondary | ICD-10-CM | POA: Diagnosis not present

## 2016-05-16 DIAGNOSIS — D509 Iron deficiency anemia, unspecified: Secondary | ICD-10-CM | POA: Diagnosis not present

## 2016-05-18 DIAGNOSIS — D509 Iron deficiency anemia, unspecified: Secondary | ICD-10-CM | POA: Diagnosis not present

## 2016-05-18 DIAGNOSIS — E119 Type 2 diabetes mellitus without complications: Secondary | ICD-10-CM | POA: Diagnosis not present

## 2016-05-18 DIAGNOSIS — N2581 Secondary hyperparathyroidism of renal origin: Secondary | ICD-10-CM | POA: Diagnosis not present

## 2016-05-18 DIAGNOSIS — D631 Anemia in chronic kidney disease: Secondary | ICD-10-CM | POA: Diagnosis not present

## 2016-05-18 DIAGNOSIS — N186 End stage renal disease: Secondary | ICD-10-CM | POA: Diagnosis not present

## 2016-05-21 DIAGNOSIS — D631 Anemia in chronic kidney disease: Secondary | ICD-10-CM | POA: Diagnosis not present

## 2016-05-21 DIAGNOSIS — E119 Type 2 diabetes mellitus without complications: Secondary | ICD-10-CM | POA: Diagnosis not present

## 2016-05-21 DIAGNOSIS — D509 Iron deficiency anemia, unspecified: Secondary | ICD-10-CM | POA: Diagnosis not present

## 2016-05-21 DIAGNOSIS — N186 End stage renal disease: Secondary | ICD-10-CM | POA: Diagnosis not present

## 2016-05-21 DIAGNOSIS — N2581 Secondary hyperparathyroidism of renal origin: Secondary | ICD-10-CM | POA: Diagnosis not present

## 2016-05-23 DIAGNOSIS — N2581 Secondary hyperparathyroidism of renal origin: Secondary | ICD-10-CM | POA: Diagnosis not present

## 2016-05-23 DIAGNOSIS — N186 End stage renal disease: Secondary | ICD-10-CM | POA: Diagnosis not present

## 2016-05-23 DIAGNOSIS — E119 Type 2 diabetes mellitus without complications: Secondary | ICD-10-CM | POA: Diagnosis not present

## 2016-05-23 DIAGNOSIS — D509 Iron deficiency anemia, unspecified: Secondary | ICD-10-CM | POA: Diagnosis not present

## 2016-05-23 DIAGNOSIS — D631 Anemia in chronic kidney disease: Secondary | ICD-10-CM | POA: Diagnosis not present

## 2016-05-25 DIAGNOSIS — N186 End stage renal disease: Secondary | ICD-10-CM | POA: Diagnosis not present

## 2016-05-25 DIAGNOSIS — N2581 Secondary hyperparathyroidism of renal origin: Secondary | ICD-10-CM | POA: Diagnosis not present

## 2016-05-25 DIAGNOSIS — D509 Iron deficiency anemia, unspecified: Secondary | ICD-10-CM | POA: Diagnosis not present

## 2016-05-25 DIAGNOSIS — D631 Anemia in chronic kidney disease: Secondary | ICD-10-CM | POA: Diagnosis not present

## 2016-05-25 DIAGNOSIS — E119 Type 2 diabetes mellitus without complications: Secondary | ICD-10-CM | POA: Diagnosis not present

## 2016-05-28 DIAGNOSIS — N2581 Secondary hyperparathyroidism of renal origin: Secondary | ICD-10-CM | POA: Diagnosis not present

## 2016-05-28 DIAGNOSIS — D631 Anemia in chronic kidney disease: Secondary | ICD-10-CM | POA: Diagnosis not present

## 2016-05-28 DIAGNOSIS — D509 Iron deficiency anemia, unspecified: Secondary | ICD-10-CM | POA: Diagnosis not present

## 2016-05-28 DIAGNOSIS — N186 End stage renal disease: Secondary | ICD-10-CM | POA: Diagnosis not present

## 2016-05-28 DIAGNOSIS — E119 Type 2 diabetes mellitus without complications: Secondary | ICD-10-CM | POA: Diagnosis not present

## 2016-05-30 DIAGNOSIS — E119 Type 2 diabetes mellitus without complications: Secondary | ICD-10-CM | POA: Diagnosis not present

## 2016-05-30 DIAGNOSIS — N2581 Secondary hyperparathyroidism of renal origin: Secondary | ICD-10-CM | POA: Diagnosis not present

## 2016-05-30 DIAGNOSIS — N186 End stage renal disease: Secondary | ICD-10-CM | POA: Diagnosis not present

## 2016-05-30 DIAGNOSIS — D631 Anemia in chronic kidney disease: Secondary | ICD-10-CM | POA: Diagnosis not present

## 2016-05-30 DIAGNOSIS — D509 Iron deficiency anemia, unspecified: Secondary | ICD-10-CM | POA: Diagnosis not present

## 2016-06-01 DIAGNOSIS — E119 Type 2 diabetes mellitus without complications: Secondary | ICD-10-CM | POA: Diagnosis not present

## 2016-06-01 DIAGNOSIS — N186 End stage renal disease: Secondary | ICD-10-CM | POA: Diagnosis not present

## 2016-06-01 DIAGNOSIS — D509 Iron deficiency anemia, unspecified: Secondary | ICD-10-CM | POA: Diagnosis not present

## 2016-06-01 DIAGNOSIS — N2581 Secondary hyperparathyroidism of renal origin: Secondary | ICD-10-CM | POA: Diagnosis not present

## 2016-06-01 DIAGNOSIS — D631 Anemia in chronic kidney disease: Secondary | ICD-10-CM | POA: Diagnosis not present

## 2016-06-04 DIAGNOSIS — N2581 Secondary hyperparathyroidism of renal origin: Secondary | ICD-10-CM | POA: Diagnosis not present

## 2016-06-04 DIAGNOSIS — D631 Anemia in chronic kidney disease: Secondary | ICD-10-CM | POA: Diagnosis not present

## 2016-06-04 DIAGNOSIS — E119 Type 2 diabetes mellitus without complications: Secondary | ICD-10-CM | POA: Diagnosis not present

## 2016-06-04 DIAGNOSIS — N186 End stage renal disease: Secondary | ICD-10-CM | POA: Diagnosis not present

## 2016-06-04 DIAGNOSIS — D509 Iron deficiency anemia, unspecified: Secondary | ICD-10-CM | POA: Diagnosis not present

## 2016-06-06 DIAGNOSIS — N2581 Secondary hyperparathyroidism of renal origin: Secondary | ICD-10-CM | POA: Diagnosis not present

## 2016-06-06 DIAGNOSIS — D631 Anemia in chronic kidney disease: Secondary | ICD-10-CM | POA: Diagnosis not present

## 2016-06-06 DIAGNOSIS — E119 Type 2 diabetes mellitus without complications: Secondary | ICD-10-CM | POA: Diagnosis not present

## 2016-06-06 DIAGNOSIS — N186 End stage renal disease: Secondary | ICD-10-CM | POA: Diagnosis not present

## 2016-06-06 DIAGNOSIS — D509 Iron deficiency anemia, unspecified: Secondary | ICD-10-CM | POA: Diagnosis not present

## 2016-06-08 DIAGNOSIS — D631 Anemia in chronic kidney disease: Secondary | ICD-10-CM | POA: Diagnosis not present

## 2016-06-08 DIAGNOSIS — E119 Type 2 diabetes mellitus without complications: Secondary | ICD-10-CM | POA: Diagnosis not present

## 2016-06-08 DIAGNOSIS — N186 End stage renal disease: Secondary | ICD-10-CM | POA: Diagnosis not present

## 2016-06-08 DIAGNOSIS — D509 Iron deficiency anemia, unspecified: Secondary | ICD-10-CM | POA: Diagnosis not present

## 2016-06-08 DIAGNOSIS — N2581 Secondary hyperparathyroidism of renal origin: Secondary | ICD-10-CM | POA: Diagnosis not present

## 2016-06-11 DIAGNOSIS — N186 End stage renal disease: Secondary | ICD-10-CM | POA: Diagnosis not present

## 2016-06-11 DIAGNOSIS — D509 Iron deficiency anemia, unspecified: Secondary | ICD-10-CM | POA: Diagnosis not present

## 2016-06-11 DIAGNOSIS — E119 Type 2 diabetes mellitus without complications: Secondary | ICD-10-CM | POA: Diagnosis not present

## 2016-06-11 DIAGNOSIS — D631 Anemia in chronic kidney disease: Secondary | ICD-10-CM | POA: Diagnosis not present

## 2016-06-11 DIAGNOSIS — N2581 Secondary hyperparathyroidism of renal origin: Secondary | ICD-10-CM | POA: Diagnosis not present

## 2016-06-12 ENCOUNTER — Other Ambulatory Visit: Payer: Self-pay | Admitting: Nephrology

## 2016-06-12 ENCOUNTER — Ambulatory Visit
Admission: RE | Admit: 2016-06-12 | Discharge: 2016-06-12 | Disposition: A | Discharge status: Home / Self Care | Payer: Medicare Other | Source: Ambulatory Visit | Attending: Nephrology | Admitting: Nephrology

## 2016-06-12 DIAGNOSIS — R05 Cough: Secondary | ICD-10-CM

## 2016-06-12 DIAGNOSIS — R053 Chronic cough: Secondary | ICD-10-CM

## 2016-06-13 DIAGNOSIS — N186 End stage renal disease: Secondary | ICD-10-CM | POA: Diagnosis not present

## 2016-06-13 DIAGNOSIS — E119 Type 2 diabetes mellitus without complications: Secondary | ICD-10-CM | POA: Diagnosis not present

## 2016-06-13 DIAGNOSIS — D509 Iron deficiency anemia, unspecified: Secondary | ICD-10-CM | POA: Diagnosis not present

## 2016-06-13 DIAGNOSIS — N2581 Secondary hyperparathyroidism of renal origin: Secondary | ICD-10-CM | POA: Diagnosis not present

## 2016-06-13 DIAGNOSIS — D631 Anemia in chronic kidney disease: Secondary | ICD-10-CM | POA: Diagnosis not present

## 2016-06-14 DIAGNOSIS — N186 End stage renal disease: Secondary | ICD-10-CM | POA: Diagnosis not present

## 2016-06-14 DIAGNOSIS — E1129 Type 2 diabetes mellitus with other diabetic kidney complication: Secondary | ICD-10-CM | POA: Diagnosis not present

## 2016-06-14 DIAGNOSIS — Z992 Dependence on renal dialysis: Secondary | ICD-10-CM | POA: Diagnosis not present

## 2016-06-15 DIAGNOSIS — Z23 Encounter for immunization: Secondary | ICD-10-CM | POA: Diagnosis not present

## 2016-06-15 DIAGNOSIS — D631 Anemia in chronic kidney disease: Secondary | ICD-10-CM | POA: Diagnosis not present

## 2016-06-15 DIAGNOSIS — D509 Iron deficiency anemia, unspecified: Secondary | ICD-10-CM | POA: Diagnosis not present

## 2016-06-15 DIAGNOSIS — N2581 Secondary hyperparathyroidism of renal origin: Secondary | ICD-10-CM | POA: Diagnosis not present

## 2016-06-15 DIAGNOSIS — E119 Type 2 diabetes mellitus without complications: Secondary | ICD-10-CM | POA: Diagnosis not present

## 2016-06-15 DIAGNOSIS — N186 End stage renal disease: Secondary | ICD-10-CM | POA: Diagnosis not present

## 2016-06-18 DIAGNOSIS — E119 Type 2 diabetes mellitus without complications: Secondary | ICD-10-CM | POA: Diagnosis not present

## 2016-06-18 DIAGNOSIS — D631 Anemia in chronic kidney disease: Secondary | ICD-10-CM | POA: Diagnosis not present

## 2016-06-18 DIAGNOSIS — N186 End stage renal disease: Secondary | ICD-10-CM | POA: Diagnosis not present

## 2016-06-18 DIAGNOSIS — D509 Iron deficiency anemia, unspecified: Secondary | ICD-10-CM | POA: Diagnosis not present

## 2016-06-18 DIAGNOSIS — N2581 Secondary hyperparathyroidism of renal origin: Secondary | ICD-10-CM | POA: Diagnosis not present

## 2016-06-18 DIAGNOSIS — Z23 Encounter for immunization: Secondary | ICD-10-CM | POA: Diagnosis not present

## 2016-06-20 DIAGNOSIS — N2581 Secondary hyperparathyroidism of renal origin: Secondary | ICD-10-CM | POA: Diagnosis not present

## 2016-06-20 DIAGNOSIS — N186 End stage renal disease: Secondary | ICD-10-CM | POA: Diagnosis not present

## 2016-06-20 DIAGNOSIS — Z23 Encounter for immunization: Secondary | ICD-10-CM | POA: Diagnosis not present

## 2016-06-20 DIAGNOSIS — D509 Iron deficiency anemia, unspecified: Secondary | ICD-10-CM | POA: Diagnosis not present

## 2016-06-20 DIAGNOSIS — D631 Anemia in chronic kidney disease: Secondary | ICD-10-CM | POA: Diagnosis not present

## 2016-06-20 DIAGNOSIS — E119 Type 2 diabetes mellitus without complications: Secondary | ICD-10-CM | POA: Diagnosis not present

## 2016-06-22 DIAGNOSIS — D631 Anemia in chronic kidney disease: Secondary | ICD-10-CM | POA: Diagnosis not present

## 2016-06-22 DIAGNOSIS — N186 End stage renal disease: Secondary | ICD-10-CM | POA: Diagnosis not present

## 2016-06-22 DIAGNOSIS — D509 Iron deficiency anemia, unspecified: Secondary | ICD-10-CM | POA: Diagnosis not present

## 2016-06-22 DIAGNOSIS — N2581 Secondary hyperparathyroidism of renal origin: Secondary | ICD-10-CM | POA: Diagnosis not present

## 2016-06-22 DIAGNOSIS — Z23 Encounter for immunization: Secondary | ICD-10-CM | POA: Diagnosis not present

## 2016-06-22 DIAGNOSIS — E119 Type 2 diabetes mellitus without complications: Secondary | ICD-10-CM | POA: Diagnosis not present

## 2016-06-25 DIAGNOSIS — D509 Iron deficiency anemia, unspecified: Secondary | ICD-10-CM | POA: Diagnosis not present

## 2016-06-25 DIAGNOSIS — N186 End stage renal disease: Secondary | ICD-10-CM | POA: Diagnosis not present

## 2016-06-25 DIAGNOSIS — Z23 Encounter for immunization: Secondary | ICD-10-CM | POA: Diagnosis not present

## 2016-06-25 DIAGNOSIS — D631 Anemia in chronic kidney disease: Secondary | ICD-10-CM | POA: Diagnosis not present

## 2016-06-25 DIAGNOSIS — E119 Type 2 diabetes mellitus without complications: Secondary | ICD-10-CM | POA: Diagnosis not present

## 2016-06-25 DIAGNOSIS — N2581 Secondary hyperparathyroidism of renal origin: Secondary | ICD-10-CM | POA: Diagnosis not present

## 2016-06-27 DIAGNOSIS — E119 Type 2 diabetes mellitus without complications: Secondary | ICD-10-CM | POA: Diagnosis not present

## 2016-06-27 DIAGNOSIS — N186 End stage renal disease: Secondary | ICD-10-CM | POA: Diagnosis not present

## 2016-06-27 DIAGNOSIS — D631 Anemia in chronic kidney disease: Secondary | ICD-10-CM | POA: Diagnosis not present

## 2016-06-27 DIAGNOSIS — Z23 Encounter for immunization: Secondary | ICD-10-CM | POA: Diagnosis not present

## 2016-06-27 DIAGNOSIS — N2581 Secondary hyperparathyroidism of renal origin: Secondary | ICD-10-CM | POA: Diagnosis not present

## 2016-06-27 DIAGNOSIS — D509 Iron deficiency anemia, unspecified: Secondary | ICD-10-CM | POA: Diagnosis not present

## 2016-06-29 DIAGNOSIS — N2581 Secondary hyperparathyroidism of renal origin: Secondary | ICD-10-CM | POA: Diagnosis not present

## 2016-06-29 DIAGNOSIS — D509 Iron deficiency anemia, unspecified: Secondary | ICD-10-CM | POA: Diagnosis not present

## 2016-06-29 DIAGNOSIS — N186 End stage renal disease: Secondary | ICD-10-CM | POA: Diagnosis not present

## 2016-06-29 DIAGNOSIS — E119 Type 2 diabetes mellitus without complications: Secondary | ICD-10-CM | POA: Diagnosis not present

## 2016-06-29 DIAGNOSIS — D631 Anemia in chronic kidney disease: Secondary | ICD-10-CM | POA: Diagnosis not present

## 2016-06-29 DIAGNOSIS — Z23 Encounter for immunization: Secondary | ICD-10-CM | POA: Diagnosis not present

## 2016-07-02 DIAGNOSIS — D509 Iron deficiency anemia, unspecified: Secondary | ICD-10-CM | POA: Diagnosis not present

## 2016-07-02 DIAGNOSIS — D631 Anemia in chronic kidney disease: Secondary | ICD-10-CM | POA: Diagnosis not present

## 2016-07-02 DIAGNOSIS — Z23 Encounter for immunization: Secondary | ICD-10-CM | POA: Diagnosis not present

## 2016-07-02 DIAGNOSIS — N2581 Secondary hyperparathyroidism of renal origin: Secondary | ICD-10-CM | POA: Diagnosis not present

## 2016-07-02 DIAGNOSIS — N186 End stage renal disease: Secondary | ICD-10-CM | POA: Diagnosis not present

## 2016-07-02 DIAGNOSIS — E119 Type 2 diabetes mellitus without complications: Secondary | ICD-10-CM | POA: Diagnosis not present

## 2016-07-04 DIAGNOSIS — N186 End stage renal disease: Secondary | ICD-10-CM | POA: Diagnosis not present

## 2016-07-04 DIAGNOSIS — N2581 Secondary hyperparathyroidism of renal origin: Secondary | ICD-10-CM | POA: Diagnosis not present

## 2016-07-04 DIAGNOSIS — Z23 Encounter for immunization: Secondary | ICD-10-CM | POA: Diagnosis not present

## 2016-07-04 DIAGNOSIS — D509 Iron deficiency anemia, unspecified: Secondary | ICD-10-CM | POA: Diagnosis not present

## 2016-07-04 DIAGNOSIS — D631 Anemia in chronic kidney disease: Secondary | ICD-10-CM | POA: Diagnosis not present

## 2016-07-04 DIAGNOSIS — E119 Type 2 diabetes mellitus without complications: Secondary | ICD-10-CM | POA: Diagnosis not present

## 2016-07-06 DIAGNOSIS — N186 End stage renal disease: Secondary | ICD-10-CM | POA: Diagnosis not present

## 2016-07-06 DIAGNOSIS — E119 Type 2 diabetes mellitus without complications: Secondary | ICD-10-CM | POA: Diagnosis not present

## 2016-07-06 DIAGNOSIS — D631 Anemia in chronic kidney disease: Secondary | ICD-10-CM | POA: Diagnosis not present

## 2016-07-06 DIAGNOSIS — Z23 Encounter for immunization: Secondary | ICD-10-CM | POA: Diagnosis not present

## 2016-07-06 DIAGNOSIS — D509 Iron deficiency anemia, unspecified: Secondary | ICD-10-CM | POA: Diagnosis not present

## 2016-07-06 DIAGNOSIS — N2581 Secondary hyperparathyroidism of renal origin: Secondary | ICD-10-CM | POA: Diagnosis not present

## 2016-07-09 DIAGNOSIS — N2581 Secondary hyperparathyroidism of renal origin: Secondary | ICD-10-CM | POA: Diagnosis not present

## 2016-07-09 DIAGNOSIS — D509 Iron deficiency anemia, unspecified: Secondary | ICD-10-CM | POA: Diagnosis not present

## 2016-07-09 DIAGNOSIS — E119 Type 2 diabetes mellitus without complications: Secondary | ICD-10-CM | POA: Diagnosis not present

## 2016-07-09 DIAGNOSIS — N186 End stage renal disease: Secondary | ICD-10-CM | POA: Diagnosis not present

## 2016-07-09 DIAGNOSIS — D631 Anemia in chronic kidney disease: Secondary | ICD-10-CM | POA: Diagnosis not present

## 2016-07-09 DIAGNOSIS — Z23 Encounter for immunization: Secondary | ICD-10-CM | POA: Diagnosis not present

## 2016-07-11 DIAGNOSIS — N2581 Secondary hyperparathyroidism of renal origin: Secondary | ICD-10-CM | POA: Diagnosis not present

## 2016-07-11 DIAGNOSIS — D509 Iron deficiency anemia, unspecified: Secondary | ICD-10-CM | POA: Diagnosis not present

## 2016-07-11 DIAGNOSIS — D631 Anemia in chronic kidney disease: Secondary | ICD-10-CM | POA: Diagnosis not present

## 2016-07-11 DIAGNOSIS — Z23 Encounter for immunization: Secondary | ICD-10-CM | POA: Diagnosis not present

## 2016-07-11 DIAGNOSIS — E119 Type 2 diabetes mellitus without complications: Secondary | ICD-10-CM | POA: Diagnosis not present

## 2016-07-11 DIAGNOSIS — N186 End stage renal disease: Secondary | ICD-10-CM | POA: Diagnosis not present

## 2016-07-13 DIAGNOSIS — D631 Anemia in chronic kidney disease: Secondary | ICD-10-CM | POA: Diagnosis not present

## 2016-07-13 DIAGNOSIS — Z23 Encounter for immunization: Secondary | ICD-10-CM | POA: Diagnosis not present

## 2016-07-13 DIAGNOSIS — E119 Type 2 diabetes mellitus without complications: Secondary | ICD-10-CM | POA: Diagnosis not present

## 2016-07-13 DIAGNOSIS — D509 Iron deficiency anemia, unspecified: Secondary | ICD-10-CM | POA: Diagnosis not present

## 2016-07-13 DIAGNOSIS — N186 End stage renal disease: Secondary | ICD-10-CM | POA: Diagnosis not present

## 2016-07-13 DIAGNOSIS — N2581 Secondary hyperparathyroidism of renal origin: Secondary | ICD-10-CM | POA: Diagnosis not present

## 2016-07-14 DIAGNOSIS — Z992 Dependence on renal dialysis: Secondary | ICD-10-CM | POA: Diagnosis not present

## 2016-07-14 DIAGNOSIS — E1129 Type 2 diabetes mellitus with other diabetic kidney complication: Secondary | ICD-10-CM | POA: Diagnosis not present

## 2016-07-14 DIAGNOSIS — N186 End stage renal disease: Secondary | ICD-10-CM | POA: Diagnosis not present

## 2016-07-16 DIAGNOSIS — N2581 Secondary hyperparathyroidism of renal origin: Secondary | ICD-10-CM | POA: Diagnosis not present

## 2016-07-16 DIAGNOSIS — D631 Anemia in chronic kidney disease: Secondary | ICD-10-CM | POA: Diagnosis not present

## 2016-07-16 DIAGNOSIS — N186 End stage renal disease: Secondary | ICD-10-CM | POA: Diagnosis not present

## 2016-07-17 DIAGNOSIS — E113511 Type 2 diabetes mellitus with proliferative diabetic retinopathy with macular edema, right eye: Secondary | ICD-10-CM | POA: Diagnosis not present

## 2016-07-17 DIAGNOSIS — H4720 Unspecified optic atrophy: Secondary | ICD-10-CM | POA: Diagnosis not present

## 2016-07-17 DIAGNOSIS — E113512 Type 2 diabetes mellitus with proliferative diabetic retinopathy with macular edema, left eye: Secondary | ICD-10-CM | POA: Diagnosis not present

## 2016-07-17 DIAGNOSIS — H3412 Central retinal artery occlusion, left eye: Secondary | ICD-10-CM | POA: Diagnosis not present

## 2016-07-17 LAB — HM DIABETES EYE EXAM

## 2016-07-18 DIAGNOSIS — D631 Anemia in chronic kidney disease: Secondary | ICD-10-CM | POA: Diagnosis not present

## 2016-07-18 DIAGNOSIS — N2581 Secondary hyperparathyroidism of renal origin: Secondary | ICD-10-CM | POA: Diagnosis not present

## 2016-07-18 DIAGNOSIS — N186 End stage renal disease: Secondary | ICD-10-CM | POA: Diagnosis not present

## 2016-07-20 DIAGNOSIS — N186 End stage renal disease: Secondary | ICD-10-CM | POA: Diagnosis not present

## 2016-07-20 DIAGNOSIS — N2581 Secondary hyperparathyroidism of renal origin: Secondary | ICD-10-CM | POA: Diagnosis not present

## 2016-07-20 DIAGNOSIS — D631 Anemia in chronic kidney disease: Secondary | ICD-10-CM | POA: Diagnosis not present

## 2016-07-23 DIAGNOSIS — N186 End stage renal disease: Secondary | ICD-10-CM | POA: Diagnosis not present

## 2016-07-23 DIAGNOSIS — N2581 Secondary hyperparathyroidism of renal origin: Secondary | ICD-10-CM | POA: Diagnosis not present

## 2016-07-23 DIAGNOSIS — D631 Anemia in chronic kidney disease: Secondary | ICD-10-CM | POA: Diagnosis not present

## 2016-07-25 DIAGNOSIS — D631 Anemia in chronic kidney disease: Secondary | ICD-10-CM | POA: Diagnosis not present

## 2016-07-25 DIAGNOSIS — N2581 Secondary hyperparathyroidism of renal origin: Secondary | ICD-10-CM | POA: Diagnosis not present

## 2016-07-25 DIAGNOSIS — N186 End stage renal disease: Secondary | ICD-10-CM | POA: Diagnosis not present

## 2016-07-27 DIAGNOSIS — N2581 Secondary hyperparathyroidism of renal origin: Secondary | ICD-10-CM | POA: Diagnosis not present

## 2016-07-27 DIAGNOSIS — D631 Anemia in chronic kidney disease: Secondary | ICD-10-CM | POA: Diagnosis not present

## 2016-07-27 DIAGNOSIS — N186 End stage renal disease: Secondary | ICD-10-CM | POA: Diagnosis not present

## 2016-07-30 DIAGNOSIS — N2581 Secondary hyperparathyroidism of renal origin: Secondary | ICD-10-CM | POA: Diagnosis not present

## 2016-07-30 DIAGNOSIS — D631 Anemia in chronic kidney disease: Secondary | ICD-10-CM | POA: Diagnosis not present

## 2016-07-30 DIAGNOSIS — N186 End stage renal disease: Secondary | ICD-10-CM | POA: Diagnosis not present

## 2016-08-01 DIAGNOSIS — N2581 Secondary hyperparathyroidism of renal origin: Secondary | ICD-10-CM | POA: Diagnosis not present

## 2016-08-01 DIAGNOSIS — D631 Anemia in chronic kidney disease: Secondary | ICD-10-CM | POA: Diagnosis not present

## 2016-08-01 DIAGNOSIS — N186 End stage renal disease: Secondary | ICD-10-CM | POA: Diagnosis not present

## 2016-08-02 DIAGNOSIS — Z992 Dependence on renal dialysis: Secondary | ICD-10-CM | POA: Diagnosis not present

## 2016-08-02 DIAGNOSIS — N186 End stage renal disease: Secondary | ICD-10-CM | POA: Diagnosis not present

## 2016-08-02 DIAGNOSIS — T82858D Stenosis of vascular prosthetic devices, implants and grafts, subsequent encounter: Secondary | ICD-10-CM | POA: Diagnosis not present

## 2016-08-02 DIAGNOSIS — I871 Compression of vein: Secondary | ICD-10-CM | POA: Diagnosis not present

## 2016-08-03 DIAGNOSIS — D631 Anemia in chronic kidney disease: Secondary | ICD-10-CM | POA: Diagnosis not present

## 2016-08-03 DIAGNOSIS — N2581 Secondary hyperparathyroidism of renal origin: Secondary | ICD-10-CM | POA: Diagnosis not present

## 2016-08-03 DIAGNOSIS — N186 End stage renal disease: Secondary | ICD-10-CM | POA: Diagnosis not present

## 2016-08-06 DIAGNOSIS — N2581 Secondary hyperparathyroidism of renal origin: Secondary | ICD-10-CM | POA: Diagnosis not present

## 2016-08-06 DIAGNOSIS — D631 Anemia in chronic kidney disease: Secondary | ICD-10-CM | POA: Diagnosis not present

## 2016-08-06 DIAGNOSIS — N186 End stage renal disease: Secondary | ICD-10-CM | POA: Diagnosis not present

## 2016-08-07 LAB — HM DIABETES EYE EXAM

## 2016-08-08 DIAGNOSIS — E1129 Type 2 diabetes mellitus with other diabetic kidney complication: Secondary | ICD-10-CM | POA: Diagnosis not present

## 2016-08-08 DIAGNOSIS — D631 Anemia in chronic kidney disease: Secondary | ICD-10-CM | POA: Diagnosis not present

## 2016-08-08 DIAGNOSIS — N2581 Secondary hyperparathyroidism of renal origin: Secondary | ICD-10-CM | POA: Diagnosis not present

## 2016-08-08 DIAGNOSIS — N186 End stage renal disease: Secondary | ICD-10-CM | POA: Diagnosis not present

## 2016-08-10 DIAGNOSIS — N2581 Secondary hyperparathyroidism of renal origin: Secondary | ICD-10-CM | POA: Diagnosis not present

## 2016-08-10 DIAGNOSIS — D631 Anemia in chronic kidney disease: Secondary | ICD-10-CM | POA: Diagnosis not present

## 2016-08-10 DIAGNOSIS — N186 End stage renal disease: Secondary | ICD-10-CM | POA: Diagnosis not present

## 2016-08-13 ENCOUNTER — Encounter: Payer: Self-pay | Admitting: *Deleted

## 2016-08-13 DIAGNOSIS — N186 End stage renal disease: Secondary | ICD-10-CM | POA: Diagnosis not present

## 2016-08-13 DIAGNOSIS — N2581 Secondary hyperparathyroidism of renal origin: Secondary | ICD-10-CM | POA: Diagnosis not present

## 2016-08-13 DIAGNOSIS — D631 Anemia in chronic kidney disease: Secondary | ICD-10-CM | POA: Diagnosis not present

## 2016-08-14 DIAGNOSIS — Z992 Dependence on renal dialysis: Secondary | ICD-10-CM | POA: Diagnosis not present

## 2016-08-14 DIAGNOSIS — N186 End stage renal disease: Secondary | ICD-10-CM | POA: Diagnosis not present

## 2016-08-14 DIAGNOSIS — E1129 Type 2 diabetes mellitus with other diabetic kidney complication: Secondary | ICD-10-CM | POA: Diagnosis not present

## 2016-08-15 DIAGNOSIS — D509 Iron deficiency anemia, unspecified: Secondary | ICD-10-CM | POA: Diagnosis not present

## 2016-08-15 DIAGNOSIS — D631 Anemia in chronic kidney disease: Secondary | ICD-10-CM | POA: Diagnosis not present

## 2016-08-15 DIAGNOSIS — N2581 Secondary hyperparathyroidism of renal origin: Secondary | ICD-10-CM | POA: Diagnosis not present

## 2016-08-15 DIAGNOSIS — E119 Type 2 diabetes mellitus without complications: Secondary | ICD-10-CM | POA: Diagnosis not present

## 2016-08-15 DIAGNOSIS — N186 End stage renal disease: Secondary | ICD-10-CM | POA: Diagnosis not present

## 2016-08-16 ENCOUNTER — Encounter: Payer: Self-pay | Admitting: *Deleted

## 2016-08-17 DIAGNOSIS — D631 Anemia in chronic kidney disease: Secondary | ICD-10-CM | POA: Diagnosis not present

## 2016-08-17 DIAGNOSIS — N186 End stage renal disease: Secondary | ICD-10-CM | POA: Diagnosis not present

## 2016-08-17 DIAGNOSIS — N2581 Secondary hyperparathyroidism of renal origin: Secondary | ICD-10-CM | POA: Diagnosis not present

## 2016-08-17 DIAGNOSIS — D509 Iron deficiency anemia, unspecified: Secondary | ICD-10-CM | POA: Diagnosis not present

## 2016-08-17 DIAGNOSIS — E119 Type 2 diabetes mellitus without complications: Secondary | ICD-10-CM | POA: Diagnosis not present

## 2016-08-20 DIAGNOSIS — N2581 Secondary hyperparathyroidism of renal origin: Secondary | ICD-10-CM | POA: Diagnosis not present

## 2016-08-20 DIAGNOSIS — E119 Type 2 diabetes mellitus without complications: Secondary | ICD-10-CM | POA: Diagnosis not present

## 2016-08-20 DIAGNOSIS — D631 Anemia in chronic kidney disease: Secondary | ICD-10-CM | POA: Diagnosis not present

## 2016-08-20 DIAGNOSIS — N186 End stage renal disease: Secondary | ICD-10-CM | POA: Diagnosis not present

## 2016-08-20 DIAGNOSIS — D509 Iron deficiency anemia, unspecified: Secondary | ICD-10-CM | POA: Diagnosis not present

## 2016-08-21 ENCOUNTER — Ambulatory Visit: Payer: Medicare Other | Admitting: Podiatry

## 2016-08-22 DIAGNOSIS — N2581 Secondary hyperparathyroidism of renal origin: Secondary | ICD-10-CM | POA: Diagnosis not present

## 2016-08-22 DIAGNOSIS — D509 Iron deficiency anemia, unspecified: Secondary | ICD-10-CM | POA: Diagnosis not present

## 2016-08-22 DIAGNOSIS — E119 Type 2 diabetes mellitus without complications: Secondary | ICD-10-CM | POA: Diagnosis not present

## 2016-08-22 DIAGNOSIS — N186 End stage renal disease: Secondary | ICD-10-CM | POA: Diagnosis not present

## 2016-08-22 DIAGNOSIS — D631 Anemia in chronic kidney disease: Secondary | ICD-10-CM | POA: Diagnosis not present

## 2016-08-24 DIAGNOSIS — N186 End stage renal disease: Secondary | ICD-10-CM | POA: Diagnosis not present

## 2016-08-24 DIAGNOSIS — N2581 Secondary hyperparathyroidism of renal origin: Secondary | ICD-10-CM | POA: Diagnosis not present

## 2016-08-24 DIAGNOSIS — E119 Type 2 diabetes mellitus without complications: Secondary | ICD-10-CM | POA: Diagnosis not present

## 2016-08-24 DIAGNOSIS — D509 Iron deficiency anemia, unspecified: Secondary | ICD-10-CM | POA: Diagnosis not present

## 2016-08-24 DIAGNOSIS — D631 Anemia in chronic kidney disease: Secondary | ICD-10-CM | POA: Diagnosis not present

## 2016-08-27 DIAGNOSIS — E119 Type 2 diabetes mellitus without complications: Secondary | ICD-10-CM | POA: Diagnosis not present

## 2016-08-27 DIAGNOSIS — N2581 Secondary hyperparathyroidism of renal origin: Secondary | ICD-10-CM | POA: Diagnosis not present

## 2016-08-27 DIAGNOSIS — N186 End stage renal disease: Secondary | ICD-10-CM | POA: Diagnosis not present

## 2016-08-27 DIAGNOSIS — D509 Iron deficiency anemia, unspecified: Secondary | ICD-10-CM | POA: Diagnosis not present

## 2016-08-27 DIAGNOSIS — D631 Anemia in chronic kidney disease: Secondary | ICD-10-CM | POA: Diagnosis not present

## 2016-08-29 DIAGNOSIS — D509 Iron deficiency anemia, unspecified: Secondary | ICD-10-CM | POA: Diagnosis not present

## 2016-08-29 DIAGNOSIS — D631 Anemia in chronic kidney disease: Secondary | ICD-10-CM | POA: Diagnosis not present

## 2016-08-29 DIAGNOSIS — N2581 Secondary hyperparathyroidism of renal origin: Secondary | ICD-10-CM | POA: Diagnosis not present

## 2016-08-29 DIAGNOSIS — E119 Type 2 diabetes mellitus without complications: Secondary | ICD-10-CM | POA: Diagnosis not present

## 2016-08-29 DIAGNOSIS — N186 End stage renal disease: Secondary | ICD-10-CM | POA: Diagnosis not present

## 2016-08-31 DIAGNOSIS — N2581 Secondary hyperparathyroidism of renal origin: Secondary | ICD-10-CM | POA: Diagnosis not present

## 2016-08-31 DIAGNOSIS — E119 Type 2 diabetes mellitus without complications: Secondary | ICD-10-CM | POA: Diagnosis not present

## 2016-08-31 DIAGNOSIS — D509 Iron deficiency anemia, unspecified: Secondary | ICD-10-CM | POA: Diagnosis not present

## 2016-08-31 DIAGNOSIS — N186 End stage renal disease: Secondary | ICD-10-CM | POA: Diagnosis not present

## 2016-08-31 DIAGNOSIS — D631 Anemia in chronic kidney disease: Secondary | ICD-10-CM | POA: Diagnosis not present

## 2016-09-02 DIAGNOSIS — N186 End stage renal disease: Secondary | ICD-10-CM | POA: Diagnosis not present

## 2016-09-02 DIAGNOSIS — N2581 Secondary hyperparathyroidism of renal origin: Secondary | ICD-10-CM | POA: Diagnosis not present

## 2016-09-02 DIAGNOSIS — D509 Iron deficiency anemia, unspecified: Secondary | ICD-10-CM | POA: Diagnosis not present

## 2016-09-02 DIAGNOSIS — E119 Type 2 diabetes mellitus without complications: Secondary | ICD-10-CM | POA: Diagnosis not present

## 2016-09-02 DIAGNOSIS — D631 Anemia in chronic kidney disease: Secondary | ICD-10-CM | POA: Diagnosis not present

## 2016-09-04 DIAGNOSIS — D631 Anemia in chronic kidney disease: Secondary | ICD-10-CM | POA: Diagnosis not present

## 2016-09-04 DIAGNOSIS — D509 Iron deficiency anemia, unspecified: Secondary | ICD-10-CM | POA: Diagnosis not present

## 2016-09-04 DIAGNOSIS — N186 End stage renal disease: Secondary | ICD-10-CM | POA: Diagnosis not present

## 2016-09-04 DIAGNOSIS — E119 Type 2 diabetes mellitus without complications: Secondary | ICD-10-CM | POA: Diagnosis not present

## 2016-09-04 DIAGNOSIS — N2581 Secondary hyperparathyroidism of renal origin: Secondary | ICD-10-CM | POA: Diagnosis not present

## 2016-09-07 DIAGNOSIS — D509 Iron deficiency anemia, unspecified: Secondary | ICD-10-CM | POA: Diagnosis not present

## 2016-09-07 DIAGNOSIS — D631 Anemia in chronic kidney disease: Secondary | ICD-10-CM | POA: Diagnosis not present

## 2016-09-07 DIAGNOSIS — N2581 Secondary hyperparathyroidism of renal origin: Secondary | ICD-10-CM | POA: Diagnosis not present

## 2016-09-07 DIAGNOSIS — E119 Type 2 diabetes mellitus without complications: Secondary | ICD-10-CM | POA: Diagnosis not present

## 2016-09-07 DIAGNOSIS — N186 End stage renal disease: Secondary | ICD-10-CM | POA: Diagnosis not present

## 2016-09-10 DIAGNOSIS — E119 Type 2 diabetes mellitus without complications: Secondary | ICD-10-CM | POA: Diagnosis not present

## 2016-09-10 DIAGNOSIS — D509 Iron deficiency anemia, unspecified: Secondary | ICD-10-CM | POA: Diagnosis not present

## 2016-09-10 DIAGNOSIS — N2581 Secondary hyperparathyroidism of renal origin: Secondary | ICD-10-CM | POA: Diagnosis not present

## 2016-09-10 DIAGNOSIS — D631 Anemia in chronic kidney disease: Secondary | ICD-10-CM | POA: Diagnosis not present

## 2016-09-10 DIAGNOSIS — N186 End stage renal disease: Secondary | ICD-10-CM | POA: Diagnosis not present

## 2016-09-12 DIAGNOSIS — D509 Iron deficiency anemia, unspecified: Secondary | ICD-10-CM | POA: Diagnosis not present

## 2016-09-12 DIAGNOSIS — N186 End stage renal disease: Secondary | ICD-10-CM | POA: Diagnosis not present

## 2016-09-12 DIAGNOSIS — D631 Anemia in chronic kidney disease: Secondary | ICD-10-CM | POA: Diagnosis not present

## 2016-09-12 DIAGNOSIS — E119 Type 2 diabetes mellitus without complications: Secondary | ICD-10-CM | POA: Diagnosis not present

## 2016-09-12 DIAGNOSIS — N2581 Secondary hyperparathyroidism of renal origin: Secondary | ICD-10-CM | POA: Diagnosis not present

## 2016-09-13 DIAGNOSIS — Z992 Dependence on renal dialysis: Secondary | ICD-10-CM | POA: Diagnosis not present

## 2016-09-13 DIAGNOSIS — N186 End stage renal disease: Secondary | ICD-10-CM | POA: Diagnosis not present

## 2016-09-13 DIAGNOSIS — E1129 Type 2 diabetes mellitus with other diabetic kidney complication: Secondary | ICD-10-CM | POA: Diagnosis not present

## 2016-09-14 DIAGNOSIS — N2581 Secondary hyperparathyroidism of renal origin: Secondary | ICD-10-CM | POA: Diagnosis not present

## 2016-09-14 DIAGNOSIS — E119 Type 2 diabetes mellitus without complications: Secondary | ICD-10-CM | POA: Diagnosis not present

## 2016-09-14 DIAGNOSIS — D631 Anemia in chronic kidney disease: Secondary | ICD-10-CM | POA: Diagnosis not present

## 2016-09-14 DIAGNOSIS — D509 Iron deficiency anemia, unspecified: Secondary | ICD-10-CM | POA: Diagnosis not present

## 2016-09-14 DIAGNOSIS — N186 End stage renal disease: Secondary | ICD-10-CM | POA: Diagnosis not present

## 2016-09-17 DIAGNOSIS — D631 Anemia in chronic kidney disease: Secondary | ICD-10-CM | POA: Diagnosis not present

## 2016-09-17 DIAGNOSIS — N2581 Secondary hyperparathyroidism of renal origin: Secondary | ICD-10-CM | POA: Diagnosis not present

## 2016-09-17 DIAGNOSIS — N186 End stage renal disease: Secondary | ICD-10-CM | POA: Diagnosis not present

## 2016-09-17 DIAGNOSIS — E119 Type 2 diabetes mellitus without complications: Secondary | ICD-10-CM | POA: Diagnosis not present

## 2016-09-17 DIAGNOSIS — D509 Iron deficiency anemia, unspecified: Secondary | ICD-10-CM | POA: Diagnosis not present

## 2016-09-19 DIAGNOSIS — E119 Type 2 diabetes mellitus without complications: Secondary | ICD-10-CM | POA: Diagnosis not present

## 2016-09-19 DIAGNOSIS — N186 End stage renal disease: Secondary | ICD-10-CM | POA: Diagnosis not present

## 2016-09-19 DIAGNOSIS — N2581 Secondary hyperparathyroidism of renal origin: Secondary | ICD-10-CM | POA: Diagnosis not present

## 2016-09-19 DIAGNOSIS — D631 Anemia in chronic kidney disease: Secondary | ICD-10-CM | POA: Diagnosis not present

## 2016-09-19 DIAGNOSIS — D509 Iron deficiency anemia, unspecified: Secondary | ICD-10-CM | POA: Diagnosis not present

## 2016-09-21 DIAGNOSIS — E119 Type 2 diabetes mellitus without complications: Secondary | ICD-10-CM | POA: Diagnosis not present

## 2016-09-21 DIAGNOSIS — N2581 Secondary hyperparathyroidism of renal origin: Secondary | ICD-10-CM | POA: Diagnosis not present

## 2016-09-21 DIAGNOSIS — D509 Iron deficiency anemia, unspecified: Secondary | ICD-10-CM | POA: Diagnosis not present

## 2016-09-21 DIAGNOSIS — N186 End stage renal disease: Secondary | ICD-10-CM | POA: Diagnosis not present

## 2016-09-21 DIAGNOSIS — D631 Anemia in chronic kidney disease: Secondary | ICD-10-CM | POA: Diagnosis not present

## 2016-09-24 DIAGNOSIS — N2581 Secondary hyperparathyroidism of renal origin: Secondary | ICD-10-CM | POA: Diagnosis not present

## 2016-09-24 DIAGNOSIS — N186 End stage renal disease: Secondary | ICD-10-CM | POA: Diagnosis not present

## 2016-09-24 DIAGNOSIS — E119 Type 2 diabetes mellitus without complications: Secondary | ICD-10-CM | POA: Diagnosis not present

## 2016-09-24 DIAGNOSIS — D509 Iron deficiency anemia, unspecified: Secondary | ICD-10-CM | POA: Diagnosis not present

## 2016-09-24 DIAGNOSIS — D631 Anemia in chronic kidney disease: Secondary | ICD-10-CM | POA: Diagnosis not present

## 2016-09-25 DIAGNOSIS — L910 Hypertrophic scar: Secondary | ICD-10-CM | POA: Diagnosis not present

## 2016-09-26 DIAGNOSIS — D509 Iron deficiency anemia, unspecified: Secondary | ICD-10-CM | POA: Diagnosis not present

## 2016-09-26 DIAGNOSIS — D631 Anemia in chronic kidney disease: Secondary | ICD-10-CM | POA: Diagnosis not present

## 2016-09-26 DIAGNOSIS — N186 End stage renal disease: Secondary | ICD-10-CM | POA: Diagnosis not present

## 2016-09-26 DIAGNOSIS — N2581 Secondary hyperparathyroidism of renal origin: Secondary | ICD-10-CM | POA: Diagnosis not present

## 2016-09-26 DIAGNOSIS — E119 Type 2 diabetes mellitus without complications: Secondary | ICD-10-CM | POA: Diagnosis not present

## 2016-09-28 DIAGNOSIS — D631 Anemia in chronic kidney disease: Secondary | ICD-10-CM | POA: Diagnosis not present

## 2016-09-28 DIAGNOSIS — N186 End stage renal disease: Secondary | ICD-10-CM | POA: Diagnosis not present

## 2016-09-28 DIAGNOSIS — D509 Iron deficiency anemia, unspecified: Secondary | ICD-10-CM | POA: Diagnosis not present

## 2016-09-28 DIAGNOSIS — E119 Type 2 diabetes mellitus without complications: Secondary | ICD-10-CM | POA: Diagnosis not present

## 2016-09-28 DIAGNOSIS — N2581 Secondary hyperparathyroidism of renal origin: Secondary | ICD-10-CM | POA: Diagnosis not present

## 2016-10-01 DIAGNOSIS — D631 Anemia in chronic kidney disease: Secondary | ICD-10-CM | POA: Diagnosis not present

## 2016-10-01 DIAGNOSIS — D509 Iron deficiency anemia, unspecified: Secondary | ICD-10-CM | POA: Diagnosis not present

## 2016-10-01 DIAGNOSIS — N186 End stage renal disease: Secondary | ICD-10-CM | POA: Diagnosis not present

## 2016-10-01 DIAGNOSIS — N2581 Secondary hyperparathyroidism of renal origin: Secondary | ICD-10-CM | POA: Diagnosis not present

## 2016-10-01 DIAGNOSIS — E119 Type 2 diabetes mellitus without complications: Secondary | ICD-10-CM | POA: Diagnosis not present

## 2016-10-03 DIAGNOSIS — D509 Iron deficiency anemia, unspecified: Secondary | ICD-10-CM | POA: Diagnosis not present

## 2016-10-03 DIAGNOSIS — N2581 Secondary hyperparathyroidism of renal origin: Secondary | ICD-10-CM | POA: Diagnosis not present

## 2016-10-03 DIAGNOSIS — E119 Type 2 diabetes mellitus without complications: Secondary | ICD-10-CM | POA: Diagnosis not present

## 2016-10-03 DIAGNOSIS — N186 End stage renal disease: Secondary | ICD-10-CM | POA: Diagnosis not present

## 2016-10-03 DIAGNOSIS — D631 Anemia in chronic kidney disease: Secondary | ICD-10-CM | POA: Diagnosis not present

## 2016-10-05 DIAGNOSIS — N2581 Secondary hyperparathyroidism of renal origin: Secondary | ICD-10-CM | POA: Diagnosis not present

## 2016-10-05 DIAGNOSIS — D631 Anemia in chronic kidney disease: Secondary | ICD-10-CM | POA: Diagnosis not present

## 2016-10-05 DIAGNOSIS — D509 Iron deficiency anemia, unspecified: Secondary | ICD-10-CM | POA: Diagnosis not present

## 2016-10-05 DIAGNOSIS — E119 Type 2 diabetes mellitus without complications: Secondary | ICD-10-CM | POA: Diagnosis not present

## 2016-10-05 DIAGNOSIS — N186 End stage renal disease: Secondary | ICD-10-CM | POA: Diagnosis not present

## 2016-10-07 DIAGNOSIS — N186 End stage renal disease: Secondary | ICD-10-CM | POA: Diagnosis not present

## 2016-10-07 DIAGNOSIS — E119 Type 2 diabetes mellitus without complications: Secondary | ICD-10-CM | POA: Diagnosis not present

## 2016-10-07 DIAGNOSIS — D509 Iron deficiency anemia, unspecified: Secondary | ICD-10-CM | POA: Diagnosis not present

## 2016-10-07 DIAGNOSIS — D631 Anemia in chronic kidney disease: Secondary | ICD-10-CM | POA: Diagnosis not present

## 2016-10-07 DIAGNOSIS — N2581 Secondary hyperparathyroidism of renal origin: Secondary | ICD-10-CM | POA: Diagnosis not present

## 2016-10-10 DIAGNOSIS — D509 Iron deficiency anemia, unspecified: Secondary | ICD-10-CM | POA: Diagnosis not present

## 2016-10-10 DIAGNOSIS — N186 End stage renal disease: Secondary | ICD-10-CM | POA: Diagnosis not present

## 2016-10-10 DIAGNOSIS — E119 Type 2 diabetes mellitus without complications: Secondary | ICD-10-CM | POA: Diagnosis not present

## 2016-10-10 DIAGNOSIS — N2581 Secondary hyperparathyroidism of renal origin: Secondary | ICD-10-CM | POA: Diagnosis not present

## 2016-10-10 DIAGNOSIS — D631 Anemia in chronic kidney disease: Secondary | ICD-10-CM | POA: Diagnosis not present

## 2016-10-12 DIAGNOSIS — E119 Type 2 diabetes mellitus without complications: Secondary | ICD-10-CM | POA: Diagnosis not present

## 2016-10-12 DIAGNOSIS — N186 End stage renal disease: Secondary | ICD-10-CM | POA: Diagnosis not present

## 2016-10-12 DIAGNOSIS — D509 Iron deficiency anemia, unspecified: Secondary | ICD-10-CM | POA: Diagnosis not present

## 2016-10-12 DIAGNOSIS — N2581 Secondary hyperparathyroidism of renal origin: Secondary | ICD-10-CM | POA: Diagnosis not present

## 2016-10-12 DIAGNOSIS — D631 Anemia in chronic kidney disease: Secondary | ICD-10-CM | POA: Diagnosis not present

## 2016-10-14 DIAGNOSIS — N2581 Secondary hyperparathyroidism of renal origin: Secondary | ICD-10-CM | POA: Diagnosis not present

## 2016-10-14 DIAGNOSIS — E1129 Type 2 diabetes mellitus with other diabetic kidney complication: Secondary | ICD-10-CM | POA: Diagnosis not present

## 2016-10-14 DIAGNOSIS — Z992 Dependence on renal dialysis: Secondary | ICD-10-CM | POA: Diagnosis not present

## 2016-10-14 DIAGNOSIS — D631 Anemia in chronic kidney disease: Secondary | ICD-10-CM | POA: Diagnosis not present

## 2016-10-14 DIAGNOSIS — D509 Iron deficiency anemia, unspecified: Secondary | ICD-10-CM | POA: Diagnosis not present

## 2016-10-14 DIAGNOSIS — N186 End stage renal disease: Secondary | ICD-10-CM | POA: Diagnosis not present

## 2016-10-14 DIAGNOSIS — E119 Type 2 diabetes mellitus without complications: Secondary | ICD-10-CM | POA: Diagnosis not present

## 2016-10-17 DIAGNOSIS — E119 Type 2 diabetes mellitus without complications: Secondary | ICD-10-CM | POA: Diagnosis not present

## 2016-10-17 DIAGNOSIS — D631 Anemia in chronic kidney disease: Secondary | ICD-10-CM | POA: Diagnosis not present

## 2016-10-17 DIAGNOSIS — N186 End stage renal disease: Secondary | ICD-10-CM | POA: Diagnosis not present

## 2016-10-17 DIAGNOSIS — D509 Iron deficiency anemia, unspecified: Secondary | ICD-10-CM | POA: Diagnosis not present

## 2016-10-17 DIAGNOSIS — N2581 Secondary hyperparathyroidism of renal origin: Secondary | ICD-10-CM | POA: Diagnosis not present

## 2016-10-19 DIAGNOSIS — D509 Iron deficiency anemia, unspecified: Secondary | ICD-10-CM | POA: Diagnosis not present

## 2016-10-19 DIAGNOSIS — E119 Type 2 diabetes mellitus without complications: Secondary | ICD-10-CM | POA: Diagnosis not present

## 2016-10-19 DIAGNOSIS — N2581 Secondary hyperparathyroidism of renal origin: Secondary | ICD-10-CM | POA: Diagnosis not present

## 2016-10-19 DIAGNOSIS — D631 Anemia in chronic kidney disease: Secondary | ICD-10-CM | POA: Diagnosis not present

## 2016-10-19 DIAGNOSIS — N186 End stage renal disease: Secondary | ICD-10-CM | POA: Diagnosis not present

## 2016-10-22 DIAGNOSIS — N2581 Secondary hyperparathyroidism of renal origin: Secondary | ICD-10-CM | POA: Diagnosis not present

## 2016-10-22 DIAGNOSIS — N186 End stage renal disease: Secondary | ICD-10-CM | POA: Diagnosis not present

## 2016-10-22 DIAGNOSIS — D509 Iron deficiency anemia, unspecified: Secondary | ICD-10-CM | POA: Diagnosis not present

## 2016-10-22 DIAGNOSIS — D631 Anemia in chronic kidney disease: Secondary | ICD-10-CM | POA: Diagnosis not present

## 2016-10-22 DIAGNOSIS — E119 Type 2 diabetes mellitus without complications: Secondary | ICD-10-CM | POA: Diagnosis not present

## 2016-10-24 DIAGNOSIS — D509 Iron deficiency anemia, unspecified: Secondary | ICD-10-CM | POA: Diagnosis not present

## 2016-10-24 DIAGNOSIS — N186 End stage renal disease: Secondary | ICD-10-CM | POA: Diagnosis not present

## 2016-10-24 DIAGNOSIS — D631 Anemia in chronic kidney disease: Secondary | ICD-10-CM | POA: Diagnosis not present

## 2016-10-24 DIAGNOSIS — E119 Type 2 diabetes mellitus without complications: Secondary | ICD-10-CM | POA: Diagnosis not present

## 2016-10-24 DIAGNOSIS — N2581 Secondary hyperparathyroidism of renal origin: Secondary | ICD-10-CM | POA: Diagnosis not present

## 2016-10-26 DIAGNOSIS — E119 Type 2 diabetes mellitus without complications: Secondary | ICD-10-CM | POA: Diagnosis not present

## 2016-10-26 DIAGNOSIS — N2581 Secondary hyperparathyroidism of renal origin: Secondary | ICD-10-CM | POA: Diagnosis not present

## 2016-10-26 DIAGNOSIS — D509 Iron deficiency anemia, unspecified: Secondary | ICD-10-CM | POA: Diagnosis not present

## 2016-10-26 DIAGNOSIS — D631 Anemia in chronic kidney disease: Secondary | ICD-10-CM | POA: Diagnosis not present

## 2016-10-26 DIAGNOSIS — N186 End stage renal disease: Secondary | ICD-10-CM | POA: Diagnosis not present

## 2016-10-27 ENCOUNTER — Emergency Department (HOSPITAL_COMMUNITY): Payer: Medicare Other

## 2016-10-27 ENCOUNTER — Encounter (HOSPITAL_COMMUNITY): Payer: Self-pay

## 2016-10-27 ENCOUNTER — Inpatient Hospital Stay (HOSPITAL_COMMUNITY)
Admission: EM | Admit: 2016-10-27 | Discharge: 2016-10-29 | DRG: 069 | Disposition: A | Discharge status: Home / Self Care | Payer: Medicare Other | Attending: Student in an Organized Health Care Education/Training Program | Admitting: Student in an Organized Health Care Education/Training Program

## 2016-10-27 DIAGNOSIS — R471 Dysarthria and anarthria: Secondary | ICD-10-CM | POA: Diagnosis present

## 2016-10-27 DIAGNOSIS — R404 Transient alteration of awareness: Secondary | ICD-10-CM | POA: Diagnosis not present

## 2016-10-27 DIAGNOSIS — H9191 Unspecified hearing loss, right ear: Secondary | ICD-10-CM | POA: Diagnosis present

## 2016-10-27 DIAGNOSIS — E1165 Type 2 diabetes mellitus with hyperglycemia: Secondary | ICD-10-CM | POA: Diagnosis present

## 2016-10-27 DIAGNOSIS — M109 Gout, unspecified: Secondary | ICD-10-CM | POA: Diagnosis present

## 2016-10-27 DIAGNOSIS — Z6832 Body mass index (BMI) 32.0-32.9, adult: Secondary | ICD-10-CM

## 2016-10-27 DIAGNOSIS — E11319 Type 2 diabetes mellitus with unspecified diabetic retinopathy without macular edema: Secondary | ICD-10-CM | POA: Diagnosis present

## 2016-10-27 DIAGNOSIS — Z79899 Other long term (current) drug therapy: Secondary | ICD-10-CM

## 2016-10-27 DIAGNOSIS — E1169 Type 2 diabetes mellitus with other specified complication: Secondary | ICD-10-CM | POA: Diagnosis present

## 2016-10-27 DIAGNOSIS — I5042 Chronic combined systolic (congestive) and diastolic (congestive) heart failure: Secondary | ICD-10-CM | POA: Diagnosis not present

## 2016-10-27 DIAGNOSIS — E785 Hyperlipidemia, unspecified: Secondary | ICD-10-CM | POA: Diagnosis present

## 2016-10-27 DIAGNOSIS — N2581 Secondary hyperparathyroidism of renal origin: Secondary | ICD-10-CM | POA: Diagnosis present

## 2016-10-27 DIAGNOSIS — E041 Nontoxic single thyroid nodule: Secondary | ICD-10-CM | POA: Diagnosis present

## 2016-10-27 DIAGNOSIS — I69322 Dysarthria following cerebral infarction: Secondary | ICD-10-CM

## 2016-10-27 DIAGNOSIS — R29898 Other symptoms and signs involving the musculoskeletal system: Secondary | ICD-10-CM

## 2016-10-27 DIAGNOSIS — E669 Obesity, unspecified: Secondary | ICD-10-CM | POA: Diagnosis present

## 2016-10-27 DIAGNOSIS — Z7982 Long term (current) use of aspirin: Secondary | ICD-10-CM

## 2016-10-27 DIAGNOSIS — I69359 Hemiplegia and hemiparesis following cerebral infarction affecting unspecified side: Secondary | ICD-10-CM

## 2016-10-27 DIAGNOSIS — I5022 Chronic systolic (congestive) heart failure: Secondary | ICD-10-CM | POA: Diagnosis present

## 2016-10-27 DIAGNOSIS — I132 Hypertensive heart and chronic kidney disease with heart failure and with stage 5 chronic kidney disease, or end stage renal disease: Secondary | ICD-10-CM | POA: Diagnosis not present

## 2016-10-27 DIAGNOSIS — Z961 Presence of intraocular lens: Secondary | ICD-10-CM | POA: Diagnosis present

## 2016-10-27 DIAGNOSIS — B1920 Unspecified viral hepatitis C without hepatic coma: Secondary | ICD-10-CM | POA: Diagnosis present

## 2016-10-27 DIAGNOSIS — Z992 Dependence on renal dialysis: Secondary | ICD-10-CM

## 2016-10-27 DIAGNOSIS — Z8249 Family history of ischemic heart disease and other diseases of the circulatory system: Secondary | ICD-10-CM

## 2016-10-27 DIAGNOSIS — Z7902 Long term (current) use of antithrombotics/antiplatelets: Secondary | ICD-10-CM

## 2016-10-27 DIAGNOSIS — N186 End stage renal disease: Secondary | ICD-10-CM | POA: Diagnosis present

## 2016-10-27 DIAGNOSIS — Z9842 Cataract extraction status, left eye: Secondary | ICD-10-CM

## 2016-10-27 DIAGNOSIS — E1122 Type 2 diabetes mellitus with diabetic chronic kidney disease: Secondary | ICD-10-CM | POA: Diagnosis not present

## 2016-10-27 DIAGNOSIS — I2510 Atherosclerotic heart disease of native coronary artery without angina pectoris: Secondary | ICD-10-CM | POA: Diagnosis present

## 2016-10-27 DIAGNOSIS — R531 Weakness: Secondary | ICD-10-CM | POA: Diagnosis not present

## 2016-10-27 DIAGNOSIS — Z87891 Personal history of nicotine dependence: Secondary | ICD-10-CM

## 2016-10-27 DIAGNOSIS — J449 Chronic obstructive pulmonary disease, unspecified: Secondary | ICD-10-CM | POA: Diagnosis present

## 2016-10-27 DIAGNOSIS — D631 Anemia in chronic kidney disease: Secondary | ICD-10-CM | POA: Diagnosis present

## 2016-10-27 DIAGNOSIS — I953 Hypotension of hemodialysis: Secondary | ICD-10-CM | POA: Diagnosis not present

## 2016-10-27 DIAGNOSIS — Z833 Family history of diabetes mellitus: Secondary | ICD-10-CM

## 2016-10-27 DIAGNOSIS — M6281 Muscle weakness (generalized): Secondary | ICD-10-CM | POA: Diagnosis not present

## 2016-10-27 DIAGNOSIS — Z794 Long term (current) use of insulin: Secondary | ICD-10-CM

## 2016-10-27 DIAGNOSIS — R29704 NIHSS score 4: Secondary | ICD-10-CM | POA: Diagnosis present

## 2016-10-27 DIAGNOSIS — G459 Transient cerebral ischemic attack, unspecified: Secondary | ICD-10-CM | POA: Diagnosis not present

## 2016-10-27 DIAGNOSIS — R262 Difficulty in walking, not elsewhere classified: Secondary | ICD-10-CM

## 2016-10-27 DIAGNOSIS — R29818 Other symptoms and signs involving the nervous system: Secondary | ICD-10-CM | POA: Diagnosis not present

## 2016-10-27 DIAGNOSIS — Z9841 Cataract extraction status, right eye: Secondary | ICD-10-CM

## 2016-10-27 DIAGNOSIS — I120 Hypertensive chronic kidney disease with stage 5 chronic kidney disease or end stage renal disease: Secondary | ICD-10-CM

## 2016-10-27 DIAGNOSIS — I639 Cerebral infarction, unspecified: Secondary | ICD-10-CM

## 2016-10-27 LAB — I-STAT CHEM 8, ED
BUN: 49 mg/dL — ABNORMAL HIGH (ref 6–20)
CHLORIDE: 93 mmol/L — AB (ref 101–111)
CREATININE: 9.5 mg/dL — AB (ref 0.61–1.24)
Calcium, Ion: 1.02 mmol/L — ABNORMAL LOW (ref 1.15–1.40)
Glucose, Bld: 264 mg/dL — ABNORMAL HIGH (ref 65–99)
HCT: 36 % — ABNORMAL LOW (ref 39.0–52.0)
HEMOGLOBIN: 12.2 g/dL — AB (ref 13.0–17.0)
Potassium: 4.7 mmol/L (ref 3.5–5.1)
Sodium: 136 mmol/L (ref 135–145)
TCO2: 34 mmol/L (ref 0–100)

## 2016-10-27 LAB — DIFFERENTIAL
BASOS ABS: 0 10*3/uL (ref 0.0–0.1)
BASOS PCT: 0 %
EOS ABS: 0.2 10*3/uL (ref 0.0–0.7)
Eosinophils Relative: 3 %
Lymphocytes Relative: 30 %
Lymphs Abs: 2.1 10*3/uL (ref 0.7–4.0)
Monocytes Absolute: 0.6 10*3/uL (ref 0.1–1.0)
Monocytes Relative: 8 %
Neutro Abs: 4.2 10*3/uL (ref 1.7–7.7)
Neutrophils Relative %: 59 %

## 2016-10-27 LAB — CBC
HCT: 35.3 % — ABNORMAL LOW (ref 39.0–52.0)
HEMOGLOBIN: 11.9 g/dL — AB (ref 13.0–17.0)
MCH: 30.5 pg (ref 26.0–34.0)
MCHC: 33.7 g/dL (ref 30.0–36.0)
MCV: 90.5 fL (ref 78.0–100.0)
Platelets: 167 10*3/uL (ref 150–400)
RBC: 3.9 MIL/uL — ABNORMAL LOW (ref 4.22–5.81)
RDW: 13.7 % (ref 11.5–15.5)
WBC: 7.1 10*3/uL (ref 4.0–10.5)

## 2016-10-27 LAB — PROTIME-INR
INR: 1.06
Prothrombin Time: 13.8 seconds (ref 11.4–15.2)

## 2016-10-27 LAB — COMPREHENSIVE METABOLIC PANEL
ALBUMIN: 3.5 g/dL (ref 3.5–5.0)
ALT: 16 U/L — ABNORMAL LOW (ref 17–63)
AST: 17 U/L (ref 15–41)
Alkaline Phosphatase: 56 U/L (ref 38–126)
Anion gap: 17 — ABNORMAL HIGH (ref 5–15)
BUN: 54 mg/dL — AB (ref 6–20)
CO2: 28 mmol/L (ref 22–32)
Calcium: 8.8 mg/dL — ABNORMAL LOW (ref 8.9–10.3)
Chloride: 94 mmol/L — ABNORMAL LOW (ref 101–111)
Creatinine, Ser: 9.58 mg/dL — ABNORMAL HIGH (ref 0.61–1.24)
GFR calc Af Amer: 6 mL/min — ABNORMAL LOW (ref >60)
GFR calc non Af Amer: 5 mL/min — ABNORMAL LOW (ref >60)
GLUCOSE: 263 mg/dL — AB (ref 65–99)
POTASSIUM: 5.1 mmol/L (ref 3.5–5.1)
Sodium: 139 mmol/L (ref 135–145)
Total Bilirubin: 0.6 mg/dL (ref 0.3–1.2)
Total Protein: 6.4 g/dL — ABNORMAL LOW (ref 6.5–8.1)

## 2016-10-27 LAB — I-STAT TROPONIN, ED: Troponin i, poc: 0.02 ng/mL (ref 0.00–0.08)

## 2016-10-27 LAB — I-STAT CG4 LACTIC ACID, ED: Lactic Acid, Venous: 2.06 mmol/L (ref 0.5–1.9)

## 2016-10-27 LAB — APTT: APTT: 29 s (ref 24–36)

## 2016-10-27 LAB — GLUCOSE, CAPILLARY: GLUCOSE-CAPILLARY: 137 mg/dL — AB (ref 65–99)

## 2016-10-27 LAB — ETHANOL

## 2016-10-27 MED ORDER — INSULIN GLARGINE 100 UNIT/ML ~~LOC~~ SOLN
8.0000 [IU] | Freq: Every day | SUBCUTANEOUS | Status: DC
  Filled 2016-10-27: qty 0.08

## 2016-10-27 MED ORDER — CLOPIDOGREL BISULFATE 75 MG PO TABS
75.0000 mg | ORAL_TABLET | Freq: Every day | ORAL | Status: DC
  Administered 2016-10-27 – 2016-10-29 (×3): 75 mg via ORAL
  Filled 2016-10-27 (×3): qty 1

## 2016-10-27 MED ORDER — ASPIRIN EC 81 MG PO TBEC
81.0000 mg | DELAYED_RELEASE_TABLET | Freq: Every day | ORAL | Status: DC
  Administered 2016-10-28 – 2016-10-29 (×2): 81 mg via ORAL
  Filled 2016-10-27 (×2): qty 1

## 2016-10-27 MED ORDER — ACETAMINOPHEN 325 MG PO TABS: 650.0000 mg | ORAL_TABLET | ORAL | Status: DC | PRN

## 2016-10-27 MED ORDER — ACETAMINOPHEN 160 MG/5ML PO SOLN: 650.0000 mg | ORAL | Status: DC | PRN

## 2016-10-27 MED ORDER — SEVELAMER CARBONATE 800 MG PO TABS
800.0000 mg | ORAL_TABLET | Freq: Three times a day (TID) | ORAL | Status: DC
  Administered 2016-10-28 – 2016-10-29 (×4): 800 mg via ORAL
  Filled 2016-10-27 (×4): qty 1

## 2016-10-27 MED ORDER — CINACALCET HCL 30 MG PO TABS
60.0000 mg | ORAL_TABLET | Freq: Two times a day (BID) | ORAL | Status: DC
  Administered 2016-10-28 – 2016-10-29 (×3): 60 mg via ORAL
  Filled 2016-10-27 (×4): qty 2

## 2016-10-27 MED ORDER — INSULIN GLARGINE 100 UNIT/ML ~~LOC~~ SOLN
10.0000 [IU] | Freq: Every day | SUBCUTANEOUS | Status: DC
  Administered 2016-10-27 – 2016-10-28 (×2): 10 [IU] via SUBCUTANEOUS
  Filled 2016-10-27 (×4): qty 0.1

## 2016-10-27 MED ORDER — STROKE: EARLY STAGES OF RECOVERY BOOK
Freq: Once | Status: AC
  Administered 2016-10-28: 07:00:00
  Filled 2016-10-27: qty 1

## 2016-10-27 MED ORDER — INSULIN ASPART 100 UNIT/ML ~~LOC~~ SOLN
0.0000 [IU] | Freq: Three times a day (TID) | SUBCUTANEOUS | Status: DC
  Administered 2016-10-28: 1 [IU] via SUBCUTANEOUS
  Administered 2016-10-28: 3 [IU] via SUBCUTANEOUS
  Administered 2016-10-28: 1 [IU] via SUBCUTANEOUS

## 2016-10-27 MED ORDER — HEPARIN SODIUM (PORCINE) 5000 UNIT/ML IJ SOLN
5000.0000 [IU] | Freq: Three times a day (TID) | INTRAMUSCULAR | Status: DC
  Administered 2016-10-27 – 2016-10-29 (×6): 5000 [IU] via SUBCUTANEOUS
  Filled 2016-10-27 (×6): qty 1

## 2016-10-27 MED ORDER — ALLOPURINOL 100 MG PO TABS
100.0000 mg | ORAL_TABLET | Freq: Every day | ORAL | Status: DC
  Administered 2016-10-27 – 2016-10-29 (×3): 100 mg via ORAL
  Filled 2016-10-27 (×3): qty 1

## 2016-10-27 MED ORDER — ATORVASTATIN CALCIUM 40 MG PO TABS
40.0000 mg | ORAL_TABLET | Freq: Every day | ORAL | Status: DC
  Administered 2016-10-28 – 2016-10-29 (×2): 40 mg via ORAL
  Filled 2016-10-27 (×2): qty 1

## 2016-10-27 MED ORDER — ACETAMINOPHEN 650 MG RE SUPP: 650.0000 mg | RECTAL | Status: DC | PRN

## 2016-10-27 MED ORDER — LEVETIRACETAM 500 MG PO TABS: 500.0000 mg | ORAL_TABLET | ORAL | Status: DC

## 2016-10-27 MED ORDER — LEVETIRACETAM 500 MG PO TABS
1000.0000 mg | ORAL_TABLET | Freq: Every day | ORAL | Status: DC
  Administered 2016-10-28 – 2016-10-29 (×2): 1000 mg via ORAL
  Filled 2016-10-27 (×2): qty 2

## 2016-10-27 MED ORDER — SENNOSIDES-DOCUSATE SODIUM 8.6-50 MG PO TABS: 1.0000 | ORAL_TABLET | Freq: Every evening | ORAL | Status: DC | PRN

## 2016-10-27 MED ORDER — RENA-VITE PO TABS
1.0000 | ORAL_TABLET | Freq: Every day | ORAL | Status: DC
  Administered 2016-10-28: 1 via ORAL
  Filled 2016-10-27: qty 1

## 2016-10-27 NOTE — ED Provider Notes (Signed)
[facility] DEPT Provider Note   CSN: [id number] Arrival date & time: 10/27/16  1522     History   Chief Complaint Chief Complaint  Patient presents with  . Extremity Weakness    HPI Ernest [name] is a 71 y.o. male P presents to the emergency department with chief complaint of new onset left leg weakness. He had sudden onset left leg weakness 3 hours and 40 minutes ago. Patient has a history of previous CVA with right arm and leg weakness and some hearing loss on the right side. He also has a past history of diabetes, hypertension, hyperlipidemia, COPD, end-stage renal disease on hemodialysis Monday Wednesday and Friday. The patient last dialyzed yesterday. The patient denies any left arm weakness, visual disturbances, headache, vertigo or other neurologic deficits. He has not had any recent illnesses. He denies fevers, chills, URI symptoms, abdominal pain.  HPI  Past Medical History:  Diagnosis Date  . Abnormal stress test    s/p cath November 2013 with modest disease involving the ostial left main, proximal LAD, proximal RCA - do not appear to be hemodynamically signficant; mild LV dysfunction  . Bacteremia   . Chronic systolic CHF (congestive heart failure) ([place])   . COPD (chronic obstructive pulmonary disease) ([place])   . Coronary artery disease    per cath report 2013  . Ejection fraction < 50%   . Erectile dysfunction    penile implant  . ESRD (end stage renal disease) ([place])    Started HD in [place] in 2009, ESRD was due to DM. Moved to [place] [facility] in Dec 2009 and now gets dialysis at [facility] on a MWF schedule.     . Gout   . Gout    Hx: of  . Hepatitis C   . Hyperlipidemia   . Hypertension   . Obesity   . Retinopathy   . Shortness of breath    Hx: of with exertion  . Stroke ([place])   . Type II or unspecified type diabetes mellitus without mention of complication, not stated as uncontrolled    adult onset    Patient Active Problem List   Diagnosis Date Noted  .  History of CVA with residual deficit 08/07/2015  . Seizure, late effect of stroke ([place]) 08/07/2015  . Diabetic peripheral neuropathy ([place]) 08/07/2015  . Obstructive sleep apnea 06/16/2013  . Hyperlipidemia associated with type 2 diabetes mellitus ([place])   . ESRD (end stage renal disease) ([place])   . Erectile dysfunction associated with type 2 diabetes mellitus ([place])   . Hypertension due to end stage renal disease caused by type 2 diabetes mellitus, on dialysis ([place])   . COPD (chronic obstructive pulmonary disease) ([place])   . Coronary artery disease   . Chronic combined systolic and diastolic CHF (congestive heart failure) ([place])   . Gout, unspecified 02/28/2009  . Uncontrolled type 2 diabetes mellitus with end-stage renal disease ([place]) 06/08/1984    Past Surgical History:  Procedure Laterality Date  . AV FISTULA PLACEMENT Left 01/13/2013   Procedure: INSERTION OF ARTERIOVENOUS (AV) GORE-TEX GRAFT ARM;  Surgeon: Angelia Mould, MD;  Location: [facility];  Service: Vascular;  Laterality: Left;  . AV FISTULA PLACEMENT Left 05/05/2013   Procedure: INSERTION OF LEFT UPPER ARM  ARTERIOVENOUS GORTEX GRAFT;  Surgeon: Angelia Mould, MD;  Location: [facility];  Service: Vascular;  Laterality: Left;  . [place] REMOVAL Left 03/26/2013   Procedure: REMOVAL OF  NON INCORPORATED ARTERIOVENOUS GORETEX GRAFT ([place]) left arm *  repair of  left brachial artery with vein patch angioplasty.;  Surgeon: Angelia Mould, MD;  Location: [facility];  Service: Vascular;  Laterality: Left;  . CARDIAC CATHETERIZATION  August 26, 2012  . CATARACT EXTRACTION W/ INTRAOCULAR LENS  IMPLANT, BILATERAL    . COLONOSCOPY    . EMBOLECTOMY Right 08/27/2013   Procedure: EMBOLECTOMY;  Surgeon: Mal Misty, MD;  Location: [facility];  Service: Vascular;  Laterality: Right;  Thrombectomy of Radial and ulnar artery.  [name] EYE SURGERY     laser.  and surgery for DM  . fistula     RUE and wrist  . INSERTION OF DIALYSIS CATHETER Right  01/01/2013   Procedure: INSERTION OF DIALYSIS CATHETER right  internal jugular;  Surgeon: Serafina Mitchell, MD;  Location: [facility];  Service: Vascular;  Laterality: Right;  . INSERTION OF DIALYSIS CATHETER N/A 03/26/2013   Procedure: INSERTION OF DIALYSIS CATHETER Left internal jugular vein;  Surgeon: Angelia Mould, MD;  Location: [facility];  Service: Vascular;  Laterality: N/A;  . LIGATION OF ARTERIOVENOUS  FISTULA Right 12/30/2013   Procedure: REMOVAL OF SEGMENT OF GORTEX GRAFT AND FISTULA  AND REPAIR OF BRACHIAL ARTERY;  Surgeon: Mal Misty, MD;  Location: [facility];  Service: Vascular;  Laterality: Right;  . PENILE PROSTHESIS IMPLANT     1997.. no card  . TEE WITHOUT CARDIOVERSION N/A 06/11/2013   Procedure: TRANSESOPHAGEAL ECHOCARDIOGRAM (TEE);  Surgeon: Larey Dresser, MD;  Location: [facility];  Service: Cardiovascular;  Laterality: N/A;  . THROMBECTOMY W/ EMBOLECTOMY Left 03/26/2013   Procedure: Attempted thrombectomy of left arm arteriovenous goretex graft.;  Surgeon: Angelia Mould, MD;  Location: [facility];  Service: Vascular;  Laterality: Left;  [name] VENOGRAM N/A 04/07/2013   Procedure: VENOGRAM;  Surgeon: Serafina Mitchell, MD;  Location: [facility] C[facility];  Service: Cardiovascular;  Laterality: N/A;       Home Medications    Prior to Admission medications   Medication Sig Start Date End Date Taking? Authorizing Provider  ACCU-CHEK FASTCLIX LANCETS MISC Check blood sugar 4 times a day before meals and bedtime 11/29/15   Juliet Rude, MD  allopurinol (ZYLOPRIM) 100 MG tablet Take 1 tablet (100 mg total) by mouth daily. 04/16/16   Carly Montey Hora, MD  amitriptyline (ELAVIL) 25 MG tablet TAKE ONE TABLET BY MOUTH AT BEDTIME FOR  NEUROPATHY 04/16/16   Juliet Rude, MD  aspirin EC 81 MG tablet Take 1 tablet (81 mg total) by mouth daily. 11/29/15   Carly Montey Hora, MD  atorvastatin (LIPITOR) 40 MG tablet Take 1 tablet (40 mg total) by mouth daily. 11/29/15   Juliet Rude, MD  cinacalcet (SENSIPAR) 60  MG tablet Take 1 tablet (60 mg total) by mouth 2 (two) times daily. 11/29/15   Juliet Rude, MD  clopidogrel (PLAVIX) 75 MG tablet Take 1 tablet (75 mg total) by mouth daily. 11/29/15   Juliet Rude, MD  colchicine 0.6 MG tablet 1 per day on Monday and Friday 04/16/16   Sindy Guadeloupe Rivet, MD  glucose blood (ACCU-CHEK AVIVA PLUS) test strip Check blood sugar 4 times a day before meals and bedtime 11/29/15   Sindy Guadeloupe Rivet, MD  insulin glargine (LANTUS) 100 UNIT/ML injection Inject 0.1 mLs (10 Units total) into the skin at bedtime. 11/29/15   Carly Montey Hora, MD  insulin lispro (HUMALOG) 100 UNIT/ML KiwkPen Inject 0.03 mLs (3 Units total) into the skin 3 (three) times daily with meals. 11/29/15  Juliet Rude, MD  Insulin Pen Needle 32G X 5 MM MISC Please use 1 pen needle for each injection 11/29/15   Carly J Rivet, MD  levETIRAcetam (KEPPRA) 500 MG tablet Take 2 tablets (1,000 mg total) by mouth daily. Then take extra 500 mg (1 tablet) on MWF after HD 11/29/15   Carly J Rivet, MD  lisinopril (PRINIVIL,ZESTRIL) 20 MG tablet Take 1 tablet (20 mg total) by mouth at bedtime. 11/29/15   Carly Montey Hora, MD  metoprolol tartrate (LOPRESSOR) 25 MG tablet Take 1 tablet (25 mg total) by mouth 2 (two) times daily. 11/29/15   Juliet Rude, MD  multivitamin (RENA-VIT) TABS tablet Take 1 tablet by mouth daily. 11/29/15   Juliet Rude, MD  sevelamer carbonate (RENVELA) 800 MG tablet Take 1 tablet (800 mg total) by mouth 3 (three) times daily with meals. 11/29/15   Juliet Rude, MD    Family History Family History  Problem Relation Age of Onset  . Heart disease Father   . Diabetes Sister   . Diabetes Brother   . Diabetes Son     Social History Social History  Substance Use Topics  . Smoking status: Former Smoker    Years: 7.00    Quit date: 06/10/1973  . Smokeless tobacco: Never Used     Comment: Quit in 1974.  [name] Alcohol use No     Allergies   Patient has no known allergies.   Review of Systems Review of  Systems Ten systems reviewed and are negative for acute change, except as noted in the HPI.    Physical Exam Updated Vital Signs Ht 5\' 9"  (1.753 m)   Wt 99.8 kg   BMI 32.49 kg/m   Physical Exam  Constitutional: He is oriented to person, place, and time. He appears well-developed and well-nourished.  Patient appears older than stated age  HENT:  Head: Normocephalic and atraumatic.  Right Ear: External ear normal.  Left Ear: External ear normal.  Edentulous, dry oral mucosa  Eyes: EOM are normal. Pupils are equal, round, and reactive to light.  Neck: Normal range of motion. Neck supple. No JVD present.  Cardiovascular: Normal rate and regular rhythm.   Pulmonary/Chest: Effort normal and breath sounds normal. No respiratory distress.  Abdominal: Soft. Bowel sounds are normal.  Neurological: He is alert and oriented to person, place, and time.  Patient with Weakness in the left lower extremity compared to the Right. CN grossly intact except for decreased hearing on the R. Upper extremity strength 4/5 bilaterally. Speech is difficult to understand, but goal oriented, follows commands Sensation normal to light and sharp touch Moves extremities without ataxia, coordination intact Normal finger to nose and rapid alternating movements   Nursing note and vitals reviewed.    ED Treatments / Results  Labs (all labs ordered are listed, but only abnormal results are displayed) Labs Reviewed  ETHANOL  PROTIME-INR  APTT  CBC  DIFFERENTIAL  COMPREHENSIVE METABOLIC PANEL  RAPID URINE DRUG SCREEN, HOSP PERFORMED  URINALYSIS, ROUTINE W REFLEX MICROSCOPIC  I-STAT CHEM 8, ED  I-STAT TROPOININ, ED    EKG  EKG Interpretation None       Radiology No results found.  Procedures Procedures (including critical care time)  Medications Ordered in ED Medications - No data to display   Initial Impression / Assessment and Plan / ED Course  I have reviewed the triage vital signs and  the nursing notes.  Pertinent labs & imaging results that were  available during my care of the patient were reviewed by me and considered in my medical decision making (see chart for details).  Clinical Course     Patient with TIA symptoms. Patient will be admitted for TIA workup. The symptoms have resolved. Patient appears stable through visit  Final Clinical Impressions(s) / ED Diagnoses   Final diagnoses:  Transient cerebral ischemia, unspecified type  TIA (transient ischemic attack)    New Prescriptions New Prescriptions   No medications on file     Margarita Mail, [name] 10/28/16 0055    Pattricia Boss, MD 11/01/16 1349

## 2016-10-27 NOTE — Progress Notes (Signed)
Arrived from ED to 5M17. Alert and oriented x4. Denies any pain. Oriented to room. Bed in lowest position.

## 2016-10-27 NOTE — H&P (Signed)
Date: 10/27/2016               Patient Name:  Ernest [name] MRN: [id number]  DOB: 1946-08-27 Age / Sex: 71 y.o., male   PCP: Juliet Rude, MD         Medical Service: Internal Medicine Teaching Service         Attending Physician: Dr. Axel Filler, MD    First Contact: Dr. Einar Gip Pager: [phone]  Second Contact: Dr. Burgess Estelle Pager: [phone]       After Hours (After 5p/  First Contact Pager: [phone]  weekends / holidays): Second Contact Pager: [phone]   Chief Complaint: Left leg weakness  History of Present Illness: Ernest [name] is a 71 year old gentleman with a medical history of end-stage renal disease on hemodialysis (M/W/F), hyperlipidemia, type 2 diabetes, hypertension, COPD, gout, HFrEF (EF 45-50%), coronary artery disease, hepatitis C, penile implant (unable to get MRI) and prior CVA complicated by post stroke seizure who presents with left leg weakness which started on Thursday. The patient states that he started to have left leg weakness on Thursday. He states that he was walking and felt like he was going to collapse because his leg was weak. He remained weak on Friday. The patient went to his normal schedule dialysis but required a wheelchair after finishing dialysis due to this weakness. He went home and his left leg weakness did not seem to improve. At the time of the left leg weakness the patient denied any left arm weakness, numbness, tingling or slurred speech. He also denied any headache or visual changes. He denies chest pain or shortness of breath. He denies nausea, vomiting or abdominal pain. He denies diarrhea or constipation. He denies fevers, chills, or night sweats. Currently, he states that his left leg weakness seems to be improving.  In the emergency department the patient was found to be afebrile and hemodynamically stable. Stat head CT without contrast did not show any acute intracranial abnormality. Neurology was called and evaluated the  patient and have decided to admit the patient for stroke workup given his previous CVA and multiple stroke risk factors. Laboratory evaluation emergency department revealed anemia with hemoglobin of 11.9. Basic metabolic panel demonstrated hyperglycemia with a glucose of 263 and creatinine consistent with end-stage renal disease at 9.58. Initial troponin was negative. He was then admitted to the [facility] internal medicine teaching service for further management and workup of potential CVA.  Meds:  Current Meds  Medication Sig  . allopurinol (ZYLOPRIM) 100 MG tablet Take 1 tablet (100 mg total) by mouth daily.  [name] amitriptyline (ELAVIL) 25 MG tablet TAKE ONE TABLET BY MOUTH AT BEDTIME FOR  NEUROPATHY  . aspirin EC 81 MG tablet Take 1 tablet (81 mg total) by mouth daily.  [name] atorvastatin (LIPITOR) 40 MG tablet Take 1 tablet (40 mg total) by mouth daily.  [name] CALCIUM PO Take 1 tablet by mouth daily.  . cinacalcet (SENSIPAR) 60 MG tablet Take 1 tablet (60 mg total) by mouth 2 (two) times daily.  . clopidogrel (PLAVIX) 75 MG tablet Take 1 tablet (75 mg total) by mouth daily.  . colchicine 0.6 MG tablet 1 per day on Monday and Friday  . insulin glargine (LANTUS) 100 UNIT/ML injection Inject 0.1 mLs (10 Units total) into the skin at bedtime.  . insulin lispro (HUMALOG) 100 UNIT/ML KiwkPen Inject 0.03 mLs (3 Units total) into the skin 3 (three) times daily with meals.  . levETIRAcetam (  KEPPRA) 500 MG tablet Take 2 tablets (1,000 mg total) by mouth daily. Then take extra 500 mg (1 tablet) on MWF after HD  . lisinopril (PRINIVIL,ZESTRIL) 20 MG tablet Take 1 tablet (20 mg total) by mouth at bedtime.  . metoprolol tartrate (LOPRESSOR) 25 MG tablet Take 1 tablet (25 mg total) by mouth 2 (two) times daily. (Patient taking differently: Take 50 mg by mouth daily. )  . multivitamin (RENA-VIT) TABS tablet Take 1 tablet by mouth daily.  . sevelamer carbonate (RENVELA) 800 MG tablet Take 1 tablet (800 mg total) by  mouth 3 (three) times daily with meals.     Allergies: Allergies as of 10/27/2016  . ([name])   Past Medical History:  Diagnosis Date  . Abnormal stress test    s/p cath November 2013 with modest disease involving the ostial left main, proximal LAD, proximal RCA - do not appear to be hemodynamically signficant; mild LV dysfunction  . Bacteremia   . Chronic systolic CHF (congestive heart failure) ([place])   . COPD (chronic obstructive pulmonary disease) ([place])   . Coronary artery disease    per cath report 2013  . Ejection fraction < 50%   . Erectile dysfunction    penile implant  . ESRD (end stage renal disease) ([place])    Started HD in [place] in 2009, ESRD was due to DM. Moved to [facility] in Dec 2009 and now gets dialysis at [facility] on a MWF schedule.     . Gout   . Gout    Hx: of  . Hepatitis C   . Hyperlipidemia   . Hypertension   . Obesity   . Retinopathy   . Shortness of breath    Hx: of with exertion  . Stroke ([place])   . Type II or unspecified type diabetes mellitus without mention of complication, not stated as uncontrolled    adult onset    Family History:  Family History  Problem Relation Age of Onset  . Heart disease Father   . Diabetes Sister   . Diabetes Brother   . Diabetes Son      Social History: Denies alcohol, tobacco or illicit drug use.  Review of Systems: A complete ROS was negative except as per HPI.   Physical Exam: Blood pressure (!) 126/43, pulse 60, temperature 97.5 F (36.4 C), temperature source Oral, resp. rate 16, height 5\' 9"  (1.753 m), weight 212 lb 9.6 oz (96.4 kg), SpO2 99 %. Physical Exam  Constitutional: He is oriented to person, place, and time. He appears well-developed and well-nourished.  Obese  HENT:  Head: Normocephalic and atraumatic.  Poor oral dentition  Cardiovascular:  Distant heart sounds  Respiratory: Effort normal and breath sounds normal. No respiratory distress. He has no wheezes.  GI: Soft. Bowel  sounds are normal. He exhibits no distension. There is no tenderness.  Musculoskeletal: Normal range of motion.  Neurological: He is alert and oriented to person, place, and time.  No cranial nerve deficit appreciated. Strength 5 out of 5 in the upper and lower extreme degrees bilaterally. Finger nose finger intact. Heel shin intact.     EKG: Sinus rhythm, no acute ST segment elevation  CXR: Not obtained  Assessment & Plan by Problem: Active Problems:   TIA (transient ischemic attack) Ernest [name] is a 71 year old gentleman with multiple stroke risk factors including end-stage renal disease, hypertension, diabetes, hyperlipidemia and prior CVAs who presents with left leg weakness.   # Left leg weakness ##  History of CVA Patient presented with left leg weakness which started on Thursday. Subjectively, this is improved. Objectively, strength 5 out of 5 in the upper and lower extremities bilaterally. Stat CT head without contrast in the emergency department showed no acute intracranial abnormality. Given the symptoms lasted longer than 24 hours history is not typical with transient ischemic attack. Patient unable to get MRI secondary to penile implant. The exact etiology of the patient's left lower extremity weakness is unclear. The differential diagnosis includes stroke, capsular warning syndrome or less likely TIA. -- Lipid profile and hemoglobin A1c -- Repeat CT head tomorrow to look for internal capsule hypodensity -- If CT confirmed stroke patient will need a TTE -- Aspirin and Plavix -- Physical therapy and occupational therapy evaluation -- Keppra  # End stage renal disease on hemodialysis -- Continue with normal dialysis schedule. Will need dialysis Monday.  # Diabetes mellitus -- Insulin glargine 10 units at night -- Sliding scale insulin with insulin aspart 0-9 units 3 times a day with meals  # HFrEF ## HTN ## CAD ## HLD Patient with multiple cardiac and stroke risk  factors. Normotensive at presentation. Will hold antihypertensive medications and allow for permissive hypertension in the setting of potential acute stroke. -- Atorvastatin 40 mg once daily -- Aspirin plus clopidogrel -- Hold home lisinopril and metoprolol  FEN/GI: Carb modified diet DVT/PE prophylaxis: Heparin Code: Full code   Dispo: Admit patient to Observation with expected length of stay less than 2 midnights.  Signed: Ophelia Shoulder, MD 10/27/2016, 9:37 PM  Pager: [phone]

## 2016-10-27 NOTE — Consult Note (Signed)
Neurology Consultation Reason for Consult: Code Stroke Referring Physician: ED  CC: Left leg weakness  History is obtained from: Patient  HPI: Ernest [name] is a 71 y.o. male presented to the emergency room with left leg weakness started on Thursday. Initially had told RN that it started at 12 noon however upon clarification he reports that it actually started on Thursday but worsened around noon and had difficulty ambulating. He denied any weakness or numbness of his arm. No numbness of his leg. There is some slurring of his speech which is at baseline from his previous stroke. He reports he completely recovered from his previous stroke and initially had right-sided weakness. He does not remember his medications however does remember taking Plavix. He also reports having a left eye stroke in the past and has difficulty seeing with his left eye.  LKW: Unknown tpa given?: no, outside of treatment window and mild symptoms NIHSS: Initially 4 (dysarthria, left leg drift, visual field) however improved to 3 (right leg drift improved to 5/5 strength). Most of the score is due to old stroke such as dysarthria, and visual field)  ROS: A 14 point ROS was performed and is negative except as noted in the HPI.   Past Medical History:  Diagnosis Date  . Abnormal stress test    s/p cath November 2013 with modest disease involving the ostial left main, proximal LAD, proximal RCA - do not appear to be hemodynamically signficant; mild LV dysfunction  . Bacteremia   . Chronic systolic CHF (congestive heart failure) ([place])   . COPD (chronic obstructive pulmonary disease) ([place])   . Coronary artery disease    per cath report 2013  . Ejection fraction < 50%   . Erectile dysfunction    penile implant  . ESRD (end stage renal disease) ([place])    Started HD in [place] in 2009, ESRD was due to DM. Moved to [facility] in Dec 2009 and now gets dialysis at [facility] on a MWF schedule.     . Gout   . Gout    Hx: of  .  Hepatitis C   . Hyperlipidemia   . Hypertension   . Obesity   . Retinopathy   . Shortness of breath    Hx: of with exertion  . Stroke ([place])   . Type II or unspecified type diabetes mellitus without mention of complication, not stated as uncontrolled    adult onset    Family History  Problem Relation Age of Onset  . Heart disease Father   . Diabetes Sister   . Diabetes Brother   . Diabetes Son     Social History:  reports that he quit smoking about 43 years ago. He quit after 7.00 years of use. He has never used smokeless tobacco. He reports that he does not drink alcohol or use drugs.  Exam: Current vital signs: BP 124/61   Pulse 65   Temp 98.1 F (36.7 C) (Oral)   Resp 16   Ht 5\' 9"  (1.753 m)   Wt 99.8 kg (220 lb)   SpO2 100%   BMI 32.49 kg/m  Vital signs in last 24 hours: Temp:  [98.1 F (36.7 C)] 98.1 F (36.7 C) (01/13 1541) Pulse Rate:  [65-74] 65 (01/13 1645) Resp:  [12-22] 16 (01/13 1645) BP: (124-149)/(50-68) 124/61 (01/13 1645) SpO2:  [94 %-100 %] 100 % (01/13 1645) Weight:  [99.8 kg (220 lb)] 99.8 kg (220 lb) (01/13 1539)   Physical Exam  Constitutional: Appears well-developed and well-nourished.  Eyes: No scleral injection HENT: No neck rigidity. No carotid bruit appreciated. Head: Normocephalic.  Cardiovascular: Normal rate and regular rhythm.  Respiratory: Effort normal and breath sounds normal to anterior ascultation GI: Soft.  No distension. There is no tenderness.   Neuro: Mental Status: Patient is awake, alert, oriented to person, place, month, year, and situation. Patient is able to give a clear and coherent history. No signs of aphasia or neglect. He does have moderate to sever dysarthria on exam. Cranial Nerves: II: Visual field shows left eye upper field absent and right eye temporal quadrant absent. III,IV, VI: EOMI without ptosis or diploplia.  V: Facial sensation is symmetric to temperature VII: Facial movement is symmetric.   VIII: hearing is intact to voice X: Uvula elevates symmetrically XI: Shoulder shrug is symmetric. XII: tongue is midline without atrophy or fasciculations.  Motor: Tone is normal. Bulk is normal. 5/5 strength was present in all four extremities. Sensory: Sensation is symmetric to light touch and temperature. There is length dependent decrease in temperature in both feet. Vibration absent at toes and 2-3 seconds at ankle. Deep Tendon Reflexes: Absent throughout. Plantars: Toes are mute Cerebellar: FNF and HKS are intact bilaterally Gait: Stands normally and has slightly wide bases gait. Unstable during ambulation.  I have reviewed the images obtained: CT head was reviewed and showed no evidence of stroke or hemorrhage.  Impression:  71 years old male presented with left leg weakness which improved. However it improved back to 5/5. His presentation is not consistent with a TIA however a capsular warning syndrome is a possibility. Visual field testing is not consistent with a stroke likely related to retinopathy. Has multiple risk factors for acute stroke therefore will need to be admitted for stroke workup.  Recommendations: -On chart review he is not able to get MRI brain due to penile implant. He will need a repeat CT head tomorrow to look for internal capsule hypodensity. -May get carotid duplex if CT head confirms a stroke. -Lipid profile and HgbA1C -TTE if CT confirms a stroke. -Cont with aspirin and plavix (home medications) -PT/OT evaluation

## 2016-10-27 NOTE — ED Triage Notes (Signed)
Pt brought in by [facility]. Pt c/o left leg weakness. Pt stated that weakness started at about 12pm today. Pt hx of 4 strokes on right side. Pt has audible slurred speech but states that it is normal for him. EMS CBG 329.

## 2016-10-27 NOTE — ED Notes (Signed)
[facility] to activate Code Stroke

## 2016-10-28 ENCOUNTER — Observation Stay (HOSPITAL_COMMUNITY): Payer: Medicare Other

## 2016-10-28 DIAGNOSIS — E785 Hyperlipidemia, unspecified: Secondary | ICD-10-CM

## 2016-10-28 DIAGNOSIS — M6281 Muscle weakness (generalized): Secondary | ICD-10-CM | POA: Diagnosis not present

## 2016-10-28 DIAGNOSIS — Z8673 Personal history of transient ischemic attack (TIA), and cerebral infarction without residual deficits: Secondary | ICD-10-CM

## 2016-10-28 DIAGNOSIS — I5042 Chronic combined systolic (congestive) and diastolic (congestive) heart failure: Secondary | ICD-10-CM

## 2016-10-28 DIAGNOSIS — I2510 Atherosclerotic heart disease of native coronary artery without angina pectoris: Secondary | ICD-10-CM | POA: Diagnosis not present

## 2016-10-28 DIAGNOSIS — E669 Obesity, unspecified: Secondary | ICD-10-CM

## 2016-10-28 DIAGNOSIS — Z833 Family history of diabetes mellitus: Secondary | ICD-10-CM

## 2016-10-28 DIAGNOSIS — Z79899 Other long term (current) drug therapy: Secondary | ICD-10-CM

## 2016-10-28 DIAGNOSIS — E1169 Type 2 diabetes mellitus with other specified complication: Secondary | ICD-10-CM

## 2016-10-28 DIAGNOSIS — I132 Hypertensive heart and chronic kidney disease with heart failure and with stage 5 chronic kidney disease, or end stage renal disease: Secondary | ICD-10-CM

## 2016-10-28 DIAGNOSIS — Z794 Long term (current) use of insulin: Secondary | ICD-10-CM

## 2016-10-28 DIAGNOSIS — I5022 Chronic systolic (congestive) heart failure: Secondary | ICD-10-CM | POA: Diagnosis not present

## 2016-10-28 DIAGNOSIS — E1165 Type 2 diabetes mellitus with hyperglycemia: Secondary | ICD-10-CM | POA: Diagnosis not present

## 2016-10-28 DIAGNOSIS — N186 End stage renal disease: Secondary | ICD-10-CM

## 2016-10-28 DIAGNOSIS — Z7902 Long term (current) use of antithrombotics/antiplatelets: Secondary | ICD-10-CM

## 2016-10-28 DIAGNOSIS — E1122 Type 2 diabetes mellitus with diabetic chronic kidney disease: Secondary | ICD-10-CM

## 2016-10-28 DIAGNOSIS — Z960 Presence of urogenital implants: Secondary | ICD-10-CM

## 2016-10-28 DIAGNOSIS — Z7982 Long term (current) use of aspirin: Secondary | ICD-10-CM

## 2016-10-28 DIAGNOSIS — Z992 Dependence on renal dialysis: Secondary | ICD-10-CM

## 2016-10-28 DIAGNOSIS — B1920 Unspecified viral hepatitis C without hepatic coma: Secondary | ICD-10-CM | POA: Diagnosis not present

## 2016-10-28 DIAGNOSIS — Z8249 Family history of ischemic heart disease and other diseases of the circulatory system: Secondary | ICD-10-CM

## 2016-10-28 DIAGNOSIS — Z6831 Body mass index (BMI) 31.0-31.9, adult: Secondary | ICD-10-CM

## 2016-10-28 DIAGNOSIS — R29898 Other symptoms and signs involving the musculoskeletal system: Secondary | ICD-10-CM

## 2016-10-28 DIAGNOSIS — I639 Cerebral infarction, unspecified: Secondary | ICD-10-CM | POA: Diagnosis not present

## 2016-10-28 DIAGNOSIS — I120 Hypertensive chronic kidney disease with stage 5 chronic kidney disease or end stage renal disease: Secondary | ICD-10-CM

## 2016-10-28 LAB — LIPID PANEL
CHOL/HDL RATIO: 2.4 ratio
CHOLESTEROL: 116 mg/dL (ref 0–200)
HDL: 48 mg/dL (ref >40)
LDL Cholesterol: 50 mg/dL (ref 0–99)
TRIGLYCERIDES: 92 mg/dL (ref <150)
VLDL: 18 mg/dL (ref 0–40)

## 2016-10-28 LAB — GLUCOSE, CAPILLARY
GLUCOSE-CAPILLARY: 124 mg/dL — AB (ref 65–99)
GLUCOSE-CAPILLARY: 127 mg/dL — AB (ref 65–99)
GLUCOSE-CAPILLARY: 150 mg/dL — AB (ref 65–99)
Glucose-Capillary: 213 mg/dL — ABNORMAL HIGH (ref 65–99)

## 2016-10-28 MED ORDER — IOPAMIDOL (ISOVUE-370) INJECTION 76%
INTRAVENOUS | Status: AC
  Administered 2016-10-28: 50 mL via INTRAVENOUS
  Filled 2016-10-28: qty 50

## 2016-10-28 NOTE — Progress Notes (Addendum)
   Subjective: Ernest [name] was seen and evaluated today at bedside. He has no acute complaints. Denied any events overnight. LLE weakness has resolved. Reports he has intermittent left leg weakness following dialysis sessions. Also reports some hypotension during dialysis as well.   Objective:  Vital signs in last 24 hours: Vitals:   10/28/16 0400 10/28/16 0600 10/28/16 0800 10/28/16 1021  BP: 104/70 134/67 136/70 (!) 122/50  Pulse: 62 62 74 65  Resp: 16 16 16 20   Temp:  97.5 F (36.4 C) 97.8 F (36.6 C) 98.4 F (36.9 C)  TempSrc:  Oral Oral Oral  SpO2: 97% 96% 99% 97%  Weight:      Height:       General: Very pleasant african [place] male. In no acute distress. Resting comfortably in bed.  HENT: EOMI. No conjunctival injection, icterus or ptosis.  Cardiovascular: Regular rate and rhythm. No murmur or rub appreciated. Pulmonary: CTA BL, no wheezing, crackles or rhonchi appreciated. Unlabored breathing.  Abdomen: Soft, non-tender and non-distended. No guarding or rigidity. +bowel sounds.  Extremities: No peripheral edema noted BL. Intact distal pulses. No gross deformities. Neuro: Strength and sensation grossly intact, notably full strength in BL LE. Dysarthria  Psych: Mood normal and affect was mood congruent. Responds to questions appropriately.    Assessment/Plan:  Active Problems:   Uncontrolled type 2 diabetes mellitus with end-stage renal disease ([place])   Hyperlipidemia associated with type 2 diabetes mellitus ([place])   ESRD (end stage renal disease) ([place])   Hypertension due to end stage renal disease caused by type 2 diabetes mellitus, on dialysis ([facility])   Chronic combined systolic and diastolic CHF (congestive heart failure) ([facility])   TIA (transient ischemic attack)  Left Leg Weakness, Hx of CVA Transient and usually following dialysis sessions. This in addition to his hypotension during dialysis lends me to believe they are related. So far, work-up has been negative for  acute CVA however he certainly is at risk given his multiple comorbiditis. Unclear if patient is candidate for MRI given his metal penile implant. Will discuss with radiology and neurology. Will liberalize his antihypertensive regimen to try to prevent this in the future. PT/OT recommend HHPT which will be arranged for patient on discharge.  -Follow up neuro recs -Plan on continuing dual antiplatelet therapy with Plavix, ASA -D/c lisinopril - pt does have DM2 however is ESRD on HD.  -lipid panel excellent -Continue Atorvastatin.  ESRD on HD MTW Scheduled for HD tomorrow. Will need to consult nephro if staying overnight  Dm2 Lantus and sliding scale per home regimen  HTN Will continue metoprolol given its cardiac benefits however will discontinue lisinopril as described above  Dispo: Anticipated discharge today/tomorrow.  Ernest Blas, DO 10/28/2016, 2:49 PM Pager: [phone]

## 2016-10-28 NOTE — Discharge Instructions (Signed)
Please stop taking your Lisinopril. We believe your weakness is associated with low blood pressure, made worse during dialysis. Please follow up with your primary care physician within 1 week.

## 2016-10-28 NOTE — Evaluation (Signed)
Occupational Therapy Evaluation and Discharge Patient Details Name: Ernest [name] MRN: [id number] DOB: 1946/07/27 Today's Date: 10/28/2016    History of Present Illness 71 yo male admitted on 10/27/16 following onset of left sided weakness s/p 4 days. PMH significant for DM2, HLD, ESRD, HTN, CHF, TIA, CVA with vision loss in left eye and right sided weakness.    Clinical Impression   Pt reports he was independent with BADL PTA. Currently pt overall supervision for ADL and functional mobility. Pt reports he feels he has returned to his functional baseline without residual deficits. Educated pt on signs/symptoms of CVA. Pt planning to d/c home with supervision from family. No further acute OT needs identified; signing off at this time. Please re-consult if needs change. Thank you for this referral.    Follow Up Recommendations  No OT follow up;Supervision/Assistance - 24 hour    Equipment Recommendations  None recommended by OT    Recommendations for Other Services       Precautions / Restrictions Precautions Precautions: Fall Restrictions Weight Bearing Restrictions: No      Mobility Bed Mobility Overal bed mobility: Modified Independent             General bed mobility comments: Pt OOB in chair upon arrival  Transfers Overall transfer level: Needs assistance Equipment used: Rolling walker (2 wheeled) Transfers: Sit to/from Stand Sit to Stand: Supervision         General transfer comment: Cues for hand placement, supervision for safety.    Balance Overall balance assessment: History of Falls;Needs assistance Sitting-balance support: Feet supported;No upper extremity supported Sitting balance-Leahy Scale: Good     Standing balance support: Bilateral upper extremity supported Standing balance-Leahy Scale: Fair                   Standardized Balance Assessment Standardized Balance Assessment : TUG: Timed Up and Go Test     Timed Up and Go  Test TUG: Normal TUG Normal TUG (seconds): 19    ADL Overall ADL's : Needs assistance/impaired Eating/Feeding: Independent;Sitting   Grooming: Supervision/safety;Standing   Upper Body Bathing: Supervision/ safety;Sitting   Lower Body Bathing: Supervison/ safety;Sit to/from stand   Upper Body Dressing : Supervision/safety;Sitting   Lower Body Dressing: Supervision/safety;Sit to/from stand   Toilet Transfer: Supervision/safety;Ambulation;RW Toilet Transfer Details (indicate cue type and reason): Simulated by sit to stand from chair with functional mobility in room         Functional mobility during ADLs: Supervision/safety;Rolling walker General ADL Comments: Pt reports he feels he has returned to his functional baseline without residual deficits.     Vision Vision Assessment?: No apparent visual deficits   Perception     Praxis      Pertinent Vitals/Pain Pain Assessment: No/denies pain     Hand Dominance Right   Extremity/Trunk Assessment Upper Extremity Assessment Upper Extremity Assessment: Overall WFL for tasks assessed   Lower Extremity Assessment Lower Extremity Assessment: Defer to PT evaluation   Cervical / Trunk Assessment Cervical / Trunk Assessment: Normal   Communication Communication Communication: No difficulties (slurred speech from prior CVA)   Cognition Arousal/Alertness: Awake/alert Behavior During Therapy: WFL for tasks assessed/performed Overall Cognitive Status: Within Functional Limits for tasks assessed                     General Comments       Exercises       Shoulder Instructions      Home Living Family/patient expects to be  discharged to:: Private residence Living Arrangements: Spouse/significant other;Children Available Help at Discharge: Family;Available 24 hours/day Type of Home: House Home Access: Stairs to enter [employer] of Steps: 1 step into the house Entrance Stairs-Rails: None Home  Layout: Two level;1/2 bath on main level;Bed/bath upstairs Alternate Level Stairs-Number of Steps: 14 Alternate Level Stairs-Rails: Left Bathroom Shower/Tub: Tub/shower unit Shower/tub characteristics: Curtain Biochemist, clinical: Standard Bathroom Accessibility: No   Home Equipment: Environmental consultant - 2 wheels;Cane - single point;Shower seat;Hand held shower head          Prior Functioning/Environment Level of Independence: Independent with assistive device(s)        Comments: takes SCAT to go to dialysus MWF. Independent with BADL, uses shower chair for bathing, family assists with household management        OT Problem List:     OT Treatment/Interventions:      OT Goals(Current goals can be found in the care plan section) Acute Rehab OT Goals Patient Stated Goal: to get home OT Goal Formulation: All assessment and education complete, DC therapy  OT Frequency:     Barriers to D/C:            Co-evaluation              End of Session Equipment Utilized During Treatment: Rolling walker Nurse Communication: Mobility status  Activity Tolerance: Patient tolerated treatment well Patient left: in chair;with call bell/phone within reach   Time: 1149-1202 OT Time Calculation (min): 13 min Charges:  OT General Charges $OT Visit: 1 Procedure OT Evaluation $OT Eval Moderate Complexity: 1 Procedure G-Codes:     Binnie Kand M.S., OTR/L Pager: [phone]  10/28/2016, 2:09 PM

## 2016-10-28 NOTE — Progress Notes (Signed)
Going for CT scan.

## 2016-10-28 NOTE — Progress Notes (Signed)
STROKE TEAM PROGRESS NOTE   HISTORY OF PRESENT ILLNESS (per record) ZAKEE DEERMAN is a 71 y.o. male presented to the emergency room with left leg weakness started on Thursday. Initially had told RN that it started at 12 noon however upon clarification he reports that it actually started on Thursday but worsened around noon and had difficulty ambulating. He denied any weakness or numbness of his arm. No numbness of his leg. There is some slurring of his speech which is at baseline from his previous stroke. He reports he completely recovered from his previous stroke and initially had right-sided weakness. He does not remember his medications however does remember taking Plavix. He also reports having a left eye stroke in the past and has difficulty seeing with his left eye.  LKW: Unknown tpa given?: no, outside of treatment window and mild symptoms NIHSS: Initially 4 (dysarthria, left leg drift, visual field) however improved to 3 (right leg drift improved to 5/5 strength). Most of the score is due to old stroke such as dysarthria, and visual field)   SUBJECTIVE (INTERVAL HISTORY) No family is at the bedside.  Overall he feels his condition is resolved. He stated that he had left lower extremity weakness last Thursday, dragging when walk, consistent, he went to ER yesterday and was admitted. Today he feels much better and left leg symptoms back to baseline. He was able to walk with PT on the hallway with walker. He stated that he had a history of stroke in the past, had residual of slurry speech and bilateral upper extremity mild weakness.   OBJECTIVE Temp:  [97.5 F (36.4 C)-98.7 F (37.1 C)] 97.5 F (36.4 C) (01/14 0600) Pulse Rate:  [57-74] 62 (01/14 0600) Cardiac Rhythm: Normal sinus rhythm (01/13 1901) Resp:  [10-22] 16 (01/14 0600) BP: (104-198)/(43-90) 134/67 (01/14 0600) SpO2:  [91 %-100 %] 96 % (01/14 0600) Weight:  [96.4 kg (212 lb 9.6 oz)-99.8 kg (220 lb)] 96.4 kg (212 lb 9.6 oz)  (01/13 2005)  CBC:  Recent Labs Lab 10/27/16 1612 10/27/16 1626  WBC 7.1  --   NEUTROABS 4.2  --   HGB 11.9* 12.2*  HCT 35.3* 36.0*  MCV 90.5  --   PLT 167  --     Basic Metabolic Panel:  Recent Labs Lab 10/27/16 1612 10/27/16 1626  NA 139 136  K 5.1 4.7  CL 94* 93*  CO2 28  --   GLUCOSE 263* 264*  BUN 54* 49*  CREATININE 9.58* 9.50*  CALCIUM 8.8*  --     Lipid Panel:    Component Value Date/Time   CHOL 116 10/28/2016 0442   TRIG 92 10/28/2016 0442   HDL 48 10/28/2016 0442   CHOLHDL 2.4 10/28/2016 0442   VLDL 18 10/28/2016 0442   LDLCALC 50 10/28/2016 0442   HgbA1c:  Lab Results  Component Value Date   HGBA1C 7.3 11/29/2015   Urine Drug Screen: No results found for: LABOPIA, COCAINSCRNUR, LABBENZ, AMPHETMU, THCU, LABBARB    IMAGING I have personally reviewed the radiological images below and agree with the radiology interpretations.  Ct Head Wo Contrast 10/28/2016  No acute abnormality. Stable atrophy and chronic small vessel white matter ischemic changes.   Ct Head Code Stroke W/o Cm 10/27/2016 1. Atrophy and small vessel disease. No acute intracranial findings.  2. ASPECTS is 10.    PHYSICAL EXAM  Temp:  [97.5 F (36.4 C)-98.7 F (37.1 C)] 98.4 F (36.9 C) (01/14 1021) Pulse Rate:  [57-74] 65 (  01/14 1021) Resp:  [10-20] 20 (01/14 1021) BP: (104-198)/(43-90) 122/50 (01/14 1021) SpO2:  [91 %-100 %] 97 % (01/14 1021) Weight:  [212 lb 9.6 oz (96.4 kg)] 212 lb 9.6 oz (96.4 kg) (01/13 2005)  General - Well nourished, well developed, in no apparent distress.  Ophthalmologic - Fundi not visualized due to noncooperation.  Cardiovascular - Regular rate and rhythm.  Mental Status -  Level of arousal and orientation to time, place, and person were intact. Language including expression, naming, repetition, comprehension was assessed and found intact, however severe dysarthria Fund of Knowledge was assessed and was intact.  Cranial Nerves II - XII  - II - Visual field intact OU. III, IV, VI - Extraocular movements intact. V - Facial sensation intact bilaterally. VII - Facial movement intact bilaterally. VIII - Hearing & vestibular intact bilaterally. X - Palate elevates symmetrically, severe dysarthria. XI - Chin turning & shoulder shrug intact bilaterally. XII - Tongue protrusion intact.  Motor Strength - The patient's strength was 4/5 BUE proximal, 5/5 distally. BLE 4/5 and pronator drift was absent.  Bulk was normal and fasciculations were absent.   Motor Tone - Muscle tone was assessed at the neck and appendages and was normal.  Reflexes - The patient's reflexes were 1+ in all extremities and he had no pathological reflexes.  Sensory - Light touch, temperature/pinprick were assessed and were symmetrical.    Coordination - The patient had mild dysmetria bilateral FTN, but seems proportional to mild BUE weakness.  Tremor was absent.  Gait and Station - deferred.   ASSESSMENT/PLAN Mr. THAI BURGUENO is a 71 y.o. male with history of diabetes mellitus, previous strokes, retention, hyperlipidemia, hepatitis C, gout, end-stage renal disease, ulnar artery disease, COPD, and congestive heart failure presenting with left lower extremity weakness. He did not receive IV t-PA due to late presentation.  Possible stroke    Resultant  deficits resolved  MRI - pending  Repeat Head CT -  No acute abnormality.   CTA head and neck - pending  2D Echo - pending  LDL - 50  HgbA1c - pending  VTE prophylaxis - subcutaneous heparin  Diet renal/carb modified with fluid restriction Diet-HS Snack? Nothing; Room service appropriate? Yes; Fluid consistency: Thin  aspirin 81 mg daily and clopidogrel 75 mg daily prior to admission, now on aspirin 81 mg daily and clopidogrel 75 mg daily.   Patient counseled to be compliant with his antithrombotic medications  Ongoing aggressive stroke risk factor management  Therapy recommendations: Home  health PT recommended. No follow-up OT.  Disposition:  Pending  History of stroke  06/2015 presumed pontine infarct with double vision and ocular movement abnormalities  CUS unremarkable, TTE 45-50%, LDL 36 and A1C 7.7  Put on ASA and plavix at that time.  Hypertension  Stable  Permissive hypertension (OK if <220/120) for 24-48 hours post stroke and then gradually normalized within 5-7 days.  Long-term BP goal normotensive  Hyperlipidemia  Home meds:  Atorvastatin 40 mg daily resumed in hospital  LDL 50, goal < 70  Continue statin at discharge  Diabetes  HgbA1c pending, goal < 7.0  Uncontrolled  SSI   On lantus  CBG monitoring  Other Stroke Risk Factors  Advanced age  Obesity, Body mass index is 31.4 kg/m., recommend weight loss, diet and exercise as appropriate   CAD  CHF   Other Active Problems  Seizure history - On Keppra   ESRD on HD  Hepatitis C  COPD  Follows with  Dr. Tomi Likens at [facility] day # 0  Rosalin Hawking, MD PhD Stroke Neurology 10/28/2016 5:07 PM   To contact Stroke Continuity provider, please refer to [email or web address] After hours, contact General Neurology

## 2016-10-28 NOTE — Progress Notes (Signed)
Pt at [facility] now.

## 2016-10-28 NOTE — Evaluation (Signed)
Physical Therapy Evaluation Patient Details Name: Ernest [name] MRN: [id number] DOB: [birth year] Today's Date: 10/28/2016   History of Present Illness  71 yo male admitted on 10/27/16 following onset of left sided weakness s/p 4 days. PMH significant for DM2, HLD, ESRD, HTN, CHF, TIA, CVA with vision loss in left eye and right sided weakness.   Clinical Impression  Pt presents with the above diagnosis and below deficits for therapy evaluation. Prior to admission, pt lived with his wife, daughter, son-in-law and their children in a two story home with main living environment on the second level. Pt was able to perform mobility with RW and used [facility] for transportation to dialysis MWF. Pts wife in [facility] bound, but does assist with medication management. Pt feels his deficits have resolved from recent TIA and is able to mobilize better now, but continues to have some balance deficits which may benefit from continued acute therapy. Pt will benefit from HHPT upon discharge in order to assist with return to PLOF.     Follow Up Recommendations Home health PT;Supervision for mobility/OOB    Equipment Recommendations  Rolling walker with 5" wheels;Wheelchair (measurements PT);Wheelchair cushion (measurements PT);Other (comment) (if only able to get one of the above, pt prefers RW)    Recommendations for Other Services       Precautions / Restrictions Precautions Precautions: Fall Restrictions Weight Bearing Restrictions: No      Mobility  Bed Mobility Overal bed mobility: Modified Independent             General bed mobility comments: able to sit EOB without assistance  Transfers Overall transfer level: Needs assistance Equipment used: Rolling walker (2 wheeled) Transfers: Sit to/from Stand Sit to Stand: Min guard         General transfer comment: Min guard for safety from EOB  Ambulation/Gait Ambulation/Gait assistance: Min guard Ambulation Distance (Feet): 100 Feet Assistive  device: Rolling walker (2 wheeled) Gait Pattern/deviations: Step-through pattern;Decreased step length - right;Decreased step length - left Gait velocity: decreased Gait velocity interpretation: Below normal speed for age/gender General Gait Details: Decreased step length bilaterally, NBOS, some unsteadiness noted with turning.   Stairs            Wheelchair Mobility    Modified Rankin (Stroke Patients Only)       Balance Overall balance assessment: History of Falls                               Standardized Balance Assessment Standardized Balance Assessment : TUG: Timed Up and Go Test     Timed Up and Go Test TUG: Normal TUG Normal TUG (seconds): 19     Pertinent Vitals/Pain Pain Assessment: No/denies pain    Home Living Family/patient expects to be discharged to:: Private residence Living Arrangements: Spouse/significant other;Children Available Help at Discharge: Family;Available 24 hours/day Type of Home: House Home Access: Stairs to enter Entrance Stairs-Rails: None Entrance Stairs-Number of Steps: 1 step into the house Home Layout: Two level;1/2 bath on main level Home Equipment: Walker - 2 wheels;Cane - single point;Shower seat      Prior Function Level of Independence: Independent with assistive device(s)         Comments: takes [facility] to go to dialysus MWF     Hand Dominance   Dominant Hand: Right    Extremity/Trunk Assessment   Upper Extremity Assessment Upper Extremity Assessment: Defer to OT evaluation    Lower Extremity Assessment  Lower Extremity Assessment: Overall WFL for tasks assessed       Communication   Communication: No difficulties  Cognition Arousal/Alertness: Awake/alert Behavior During Therapy: WFL for tasks assessed/performed Overall Cognitive Status: Within Functional Limits for tasks assessed                      General Comments General comments (skin integrity, edema, etc.): 30 second sit  to stand performed 6 reps.     Exercises     Assessment/Plan    PT Assessment Patient needs continued PT services  PT Problem List Decreased range of motion;Decreased activity tolerance;Decreased balance;Decreased mobility;Decreased knowledge of use of DME          PT Treatment Interventions DME instruction;Gait training;Stair training;Functional mobility training;Therapeutic activities;Therapeutic exercise;Balance training;Patient/family education    PT Goals (Current goals can be found in the Care Plan section)  Acute Rehab PT Goals Patient Stated Goal: to get home PT Goal Formulation: With patient Time For Goal Achievement: 11/04/16 Potential to Achieve Goals: Good    Frequency Min 3X/week   Barriers to discharge        Co-evaluation               End of Session Equipment Utilized During Treatment: Gait belt Activity Tolerance: Patient tolerated treatment well Patient left: in chair;with call bell/phone within reach Nurse Communication: Mobility status    Functional Assessment Tool Used: clinicial judgement, mobility assessment Functional Limitation: Mobility: Walking and moving around Mobility: Walking and Moving Around Current Status [phone]): At least 20 percent but less than 40 percent impaired, limited or restricted Mobility: Walking and Moving Around Goal Status [phone]): At least 1 percent but less than 20 percent impaired, limited or restricted    Time: 8638-1771 PT Time Calculation (min) (ACUTE ONLY): 35 min   Charges:   PT Evaluation $PT Eval Moderate Complexity: 1 Procedure PT Treatments $Gait Training: 8-22 mins   PT G Codes:   PT G-Codes **NOT FOR INPATIENT CLASS** Functional Assessment Tool Used: clinicial judgement, mobility assessment Functional Limitation: Mobility: Walking and moving around Mobility: Walking and Moving Around Current Status ([id number]): At least 20 percent but less than 40 percent impaired, limited or restricted Mobility:  Walking and Moving Around Goal Status [phone]): At least 1 percent but less than 20 percent impaired, limited or restricted    Scheryl Marten PT, DPT  [phone]  10/28/2016, 1:20 PM

## 2016-10-29 ENCOUNTER — Other Ambulatory Visit (HOSPITAL_COMMUNITY): Payer: Medicare Other

## 2016-10-29 DIAGNOSIS — G459 Transient cerebral ischemic attack, unspecified: Secondary | ICD-10-CM | POA: Diagnosis present

## 2016-10-29 DIAGNOSIS — I2510 Atherosclerotic heart disease of native coronary artery without angina pectoris: Secondary | ICD-10-CM | POA: Diagnosis present

## 2016-10-29 DIAGNOSIS — E041 Nontoxic single thyroid nodule: Secondary | ICD-10-CM | POA: Diagnosis present

## 2016-10-29 DIAGNOSIS — I132 Hypertensive heart and chronic kidney disease with heart failure and with stage 5 chronic kidney disease, or end stage renal disease: Secondary | ICD-10-CM | POA: Diagnosis present

## 2016-10-29 DIAGNOSIS — M6281 Muscle weakness (generalized): Secondary | ICD-10-CM | POA: Diagnosis not present

## 2016-10-29 DIAGNOSIS — I69359 Hemiplegia and hemiparesis following cerebral infarction affecting unspecified side: Secondary | ICD-10-CM | POA: Diagnosis not present

## 2016-10-29 DIAGNOSIS — N186 End stage renal disease: Secondary | ICD-10-CM | POA: Diagnosis not present

## 2016-10-29 DIAGNOSIS — I5042 Chronic combined systolic (congestive) and diastolic (congestive) heart failure: Secondary | ICD-10-CM | POA: Diagnosis not present

## 2016-10-29 DIAGNOSIS — E1122 Type 2 diabetes mellitus with diabetic chronic kidney disease: Secondary | ICD-10-CM | POA: Diagnosis not present

## 2016-10-29 DIAGNOSIS — E785 Hyperlipidemia, unspecified: Secondary | ICD-10-CM | POA: Diagnosis present

## 2016-10-29 DIAGNOSIS — D631 Anemia in chronic kidney disease: Secondary | ICD-10-CM | POA: Diagnosis present

## 2016-10-29 DIAGNOSIS — Z992 Dependence on renal dialysis: Secondary | ICD-10-CM | POA: Diagnosis not present

## 2016-10-29 DIAGNOSIS — J449 Chronic obstructive pulmonary disease, unspecified: Secondary | ICD-10-CM | POA: Diagnosis present

## 2016-10-29 DIAGNOSIS — E1165 Type 2 diabetes mellitus with hyperglycemia: Secondary | ICD-10-CM | POA: Diagnosis present

## 2016-10-29 DIAGNOSIS — I953 Hypotension of hemodialysis: Secondary | ICD-10-CM | POA: Diagnosis present

## 2016-10-29 DIAGNOSIS — I120 Hypertensive chronic kidney disease with stage 5 chronic kidney disease or end stage renal disease: Secondary | ICD-10-CM | POA: Diagnosis not present

## 2016-10-29 DIAGNOSIS — R29704 NIHSS score 4: Secondary | ICD-10-CM | POA: Diagnosis present

## 2016-10-29 DIAGNOSIS — I69322 Dysarthria following cerebral infarction: Secondary | ICD-10-CM | POA: Diagnosis not present

## 2016-10-29 DIAGNOSIS — R471 Dysarthria and anarthria: Secondary | ICD-10-CM | POA: Diagnosis present

## 2016-10-29 DIAGNOSIS — M109 Gout, unspecified: Secondary | ICD-10-CM | POA: Diagnosis present

## 2016-10-29 DIAGNOSIS — E1129 Type 2 diabetes mellitus with other diabetic kidney complication: Secondary | ICD-10-CM | POA: Diagnosis not present

## 2016-10-29 DIAGNOSIS — H9191 Unspecified hearing loss, right ear: Secondary | ICD-10-CM | POA: Diagnosis present

## 2016-10-29 DIAGNOSIS — E669 Obesity, unspecified: Secondary | ICD-10-CM | POA: Diagnosis present

## 2016-10-29 DIAGNOSIS — E1169 Type 2 diabetes mellitus with other specified complication: Secondary | ICD-10-CM | POA: Diagnosis not present

## 2016-10-29 DIAGNOSIS — Z961 Presence of intraocular lens: Secondary | ICD-10-CM | POA: Diagnosis present

## 2016-10-29 DIAGNOSIS — B1920 Unspecified viral hepatitis C without hepatic coma: Secondary | ICD-10-CM | POA: Diagnosis present

## 2016-10-29 DIAGNOSIS — N2581 Secondary hyperparathyroidism of renal origin: Secondary | ICD-10-CM | POA: Diagnosis present

## 2016-10-29 DIAGNOSIS — E11319 Type 2 diabetes mellitus with unspecified diabetic retinopathy without macular edema: Secondary | ICD-10-CM | POA: Diagnosis present

## 2016-10-29 LAB — CBC
HCT: 33.1 % — ABNORMAL LOW (ref 39.0–52.0)
Hemoglobin: 11.6 g/dL — ABNORMAL LOW (ref 13.0–17.0)
MCH: 30.6 pg (ref 26.0–34.0)
MCHC: 35 g/dL (ref 30.0–36.0)
MCV: 87.3 fL (ref 78.0–100.0)
PLATELETS: 182 10*3/uL (ref 150–400)
RBC: 3.79 MIL/uL — AB (ref 4.22–5.81)
RDW: 13 % (ref 11.5–15.5)
WBC: 6.3 10*3/uL (ref 4.0–10.5)

## 2016-10-29 LAB — RENAL FUNCTION PANEL
Albumin: 2.9 g/dL — ABNORMAL LOW (ref 3.5–5.0)
Anion gap: 17 — ABNORMAL HIGH (ref 5–15)
BUN: 84 mg/dL — ABNORMAL HIGH (ref 6–20)
CHLORIDE: 90 mmol/L — AB (ref 101–111)
CO2: 26 mmol/L (ref 22–32)
CREATININE: 11.89 mg/dL — AB (ref 0.61–1.24)
Calcium: 8 mg/dL — ABNORMAL LOW (ref 8.9–10.3)
GFR, EST AFRICAN AMERICAN: 4 mL/min — AB (ref >60)
GFR, EST NON AFRICAN AMERICAN: 4 mL/min — AB (ref >60)
Glucose, Bld: 185 mg/dL — ABNORMAL HIGH (ref 65–99)
Phosphorus: 6.6 mg/dL — ABNORMAL HIGH (ref 2.5–4.6)
Potassium: 4.9 mmol/L (ref 3.5–5.1)
Sodium: 133 mmol/L — ABNORMAL LOW (ref 135–145)

## 2016-10-29 LAB — GLUCOSE, CAPILLARY: Glucose-Capillary: 91 mg/dL (ref 65–99)

## 2016-10-29 LAB — HEMOGLOBIN A1C
HEMOGLOBIN A1C: 8.7 % — AB (ref 4.8–5.6)
MEAN PLASMA GLUCOSE: 203 mg/dL

## 2016-10-29 MED ORDER — LIDOCAINE HCL (PF) 1 % IJ SOLN
5.0000 mL | INTRAMUSCULAR | Status: DC | PRN
Start: 2016-10-29 — End: 2016-10-29

## 2016-10-29 MED ORDER — RENA-VITE PO TABS: 1.0000 | ORAL_TABLET | Freq: Every day | ORAL | Status: DC

## 2016-10-29 MED ORDER — HEPARIN SODIUM (PORCINE) 1000 UNIT/ML DIALYSIS: 1000.0000 [IU] | INTRAMUSCULAR | Status: DC | PRN

## 2016-10-29 MED ORDER — PENTAFLUOROPROP-TETRAFLUOROETH EX AERO: 1.0000 "application " | INHALATION_SPRAY | CUTANEOUS | Status: DC | PRN

## 2016-10-29 MED ORDER — SODIUM CHLORIDE 0.9 % IV SOLN: 100.0000 mL | INTRAVENOUS | Status: DC | PRN

## 2016-10-29 MED ORDER — LIDOCAINE-PRILOCAINE 2.5-2.5 % EX CREA: 1.0000 "application " | TOPICAL_CREAM | CUTANEOUS | Status: DC | PRN

## 2016-10-29 MED ORDER — ALTEPLASE 2 MG IJ SOLR: 2.0000 mg | Freq: Once | INTRAMUSCULAR | Status: DC | PRN

## 2016-10-29 NOTE — Care Management Note (Signed)
Case Management Note  Patient Details  Name: Ernest [name] MRN: [id number] Date of Birth: 09-Oct-1946  Subjective/Objective:                  Patient presented with left leg weakness. On hemodialysis MWF. Lives at home with spouse and children. CM will follow for discharge needs pending PT/OT evals and physician orders.   Action/Plan:   Expected Discharge Date:  10/28/16               Expected Discharge Plan:     In-House Referral:     Discharge planning Services     Post Acute Care Choice:    Choice offered to:     DME Arranged:    DME Agency:     HH Arranged:    HH Agency:     Status of Service:     If discussed at [employer], dates discussed:    Additional Comments:  Rolm Baptise, RN 10/29/2016, 11:59 AM

## 2016-10-29 NOTE — Progress Notes (Signed)
Patient given discharge instructions with wife present. Verbalized understanding.  Await walker delivery then will discharge home.

## 2016-10-29 NOTE — Progress Notes (Signed)
Dialysis treatment completed.  3700 mL ultrafiltrated and net fluid removal 2800 mL.    Patient status unchanged. Lung sounds diminished to ausculation in all fields. Generalized edema. Cardiac: NSR.  Disconnected lines and removed needles.  Pressure held for 10 minutes and band aid/gauze dressing applied.  Report given to bedside RN, Anderson Malta.

## 2016-10-29 NOTE — Progress Notes (Signed)
PT Cancellation Note  Patient Details Name: DANE KOPKE MRN: [id number] DOB: [birth year]   Cancelled Treatment:    Reason Eval/Treat Not Completed: Patient at procedure or test/unavailable   Currently in HD;  Will follow up later today as time allows;  Otherwise, will follow up for PT tomorrow;   Thank you,  Roney Marion, PT  Acute Rehabilitation Services Pager [phone] Office [phone]     Colletta [place] 10/29/2016, 1:07 PM

## 2016-10-29 NOTE — Progress Notes (Signed)
Spoke with Dr. Danford Bad, okay to discharge patient after Dialysis. Patient does not need echo done inpatient, will be done outpatient. Await patient return from dialysis.

## 2016-10-29 NOTE — Progress Notes (Signed)
Patient arrived to unit per bed.  Reviewed treatment plan and this RN agrees.  Report received from bedside RN, Anderson Malta.  Consent obtained.  Patient A & O X 4. Lung sounds diminished to ausculation in all fields. Generalized edema. Cardiac: NSR.  Prepped RUAVG with alcohol and cannulated with two 15 gauge needles.  Pulsation of blood noted.  Flushed access well with saline per protocol.  Connected and secured lines and initiated tx at 1030.  UF goal of 5000 mL and net fluid removal of 4500 mL.  Will continue to monitor.

## 2016-10-29 NOTE — Procedures (Signed)
I have seen and examined this patient and agree with the plan of care   Patient seen on dialysis   BP 130/54   Pre dialysis weight  100.2 Kg   .  Ernest [name] W 10/29/2016, 10:40 AM

## 2016-10-29 NOTE — Progress Notes (Signed)
STROKE TEAM PROGRESS NOTE   SUBJECTIVE (INTERVAL HISTORY) No family is at the bedside. He is in dialysis center for HD. CTA head and neck unremarkable. Not able to do MRI due to penile implant.    OBJECTIVE Temp:  [97.3 F (36.3 C)-97.6 F (36.4 C)] 97.3 F (36.3 C) (01/15 1430) Pulse Rate:  [56-96] 96 (01/15 1531) Cardiac Rhythm: Normal sinus rhythm (01/15 1447) Resp:  [12-18] 18 (01/15 1531) BP: (55-168)/(31-93) 135/60 (01/15 1531) SpO2:  [95 %-100 %] 100 % (01/15 1531) Weight:  [214 lb 11.7 oz (97.4 kg)-220 lb 14.4 oz (100.2 kg)] 214 lb 11.7 oz (97.4 kg) (01/15 1430)  CBC:   Recent Labs Lab 10/27/16 1612 10/27/16 1626 10/29/16 1030  WBC 7.1  --  6.3  NEUTROABS 4.2  --   --   HGB 11.9* 12.2* 11.6*  HCT 35.3* 36.0* 33.1*  MCV 90.5  --  87.3  PLT 167  --  376    Basic Metabolic Panel:   Recent Labs Lab 10/27/16 1612 10/27/16 1626 10/29/16 1030  NA 139 136 133*  K 5.1 4.7 4.9  CL 94* 93* 90*  CO2 28  --  26  GLUCOSE 263* 264* 185*  BUN 54* 49* 84*  CREATININE 9.58* 9.50* 11.89*  CALCIUM 8.8*  --  8.0*  PHOS  --   --  6.6*    Lipid Panel:     Component Value Date/Time   CHOL 116 10/28/2016 0442   TRIG 92 10/28/2016 0442   HDL 48 10/28/2016 0442   CHOLHDL 2.4 10/28/2016 0442   VLDL 18 10/28/2016 0442   LDLCALC 50 10/28/2016 0442   HgbA1c:  Lab Results  Component Value Date   HGBA1C 8.7 (H) 10/28/2016   Urine Drug Screen: No results found for: LABOPIA, COCAINSCRNUR, LABBENZ, AMPHETMU, THCU, LABBARB    IMAGING I have personally reviewed the radiological images below and agree with the radiology interpretations.  Ct Head Wo Contrast 10/28/2016  No acute abnormality. Stable atrophy and chronic small vessel white matter ischemic changes.   Ct Head Code Stroke W/o Cm 10/27/2016 1. Atrophy and small vessel disease. No acute intracranial findings.  2. ASPECTS is 10.   Ct Angio Head W Or Wo Contrast  Result Date: 10/28/2016 CLINICAL DATA:  Stroke  EXAM: CT ANGIOGRAPHY HEAD AND NECK TECHNIQUE: Multidetector CT imaging of the head and neck was performed using the standard protocol during bolus administration of intravenous contrast. Multiplanar CT image reconstructions and MIPs were obtained to evaluate the vascular anatomy. Carotid stenosis measurements (when applicable) are obtained utilizing NASCET criteria, using the distal internal carotid diameter as the denominator. CONTRAST:  50 mL Isovue 370 IV COMPARISON:  Head CT 10/28/2016 FINDINGS: CTA NECK FINDINGS Aortic arch: There is no aneurysm or dissection of the visualized ascending aorta or aortic arch. There is a normal 3 vessel branching pattern. The visualized proximal subclavian arteries are normal. There is mild atherosclerotic calcification in the aortic arch. Right carotid system: There is atherosclerotic calcification at the carotid bifurcation and within the proximal right internal carotid artery, without hemodynamically significant stenosis. No aneurysm or dissection. Left carotid system: There is atherosclerotic calcification at the left carotid bifurcation without hemodynamically significant stenosis. No aneurysm or dissection. Vertebral arteries: The vertebral system is codominant. There is moderate narrowing of the right vertebral origin secondary to atherosclerotic calcification. The left vertebral artery origin is normal. Both vertebral arteries are otherwise normal to their confluence with the basilar artery. Skeleton: There is no bony spinal  canal stenosis. No lytic or blastic lesions. Other neck: There is a partially calcified left thyroid nodule measuring 2.4 cm. Upper chest: No pneumothorax or pleural effusion. No nodules or masses. Review of the MIP images confirms the above findings CTA HEAD FINDINGS Anterior circulation: --Intracranial internal carotid arteries: Normal. --Anterior cerebral arteries: Hypoplastic right A1 segment, a normal variant. The anterior cerebral arteries are  otherwise normal. --Middle cerebral arteries: Normal. --Posterior communicating arteries: Absent bilaterally. Posterior circulation: --Posterior cerebral arteries: Normal. --Superior cerebellar arteries: Normal. --Basilar artery: Normal. --Anterior inferior cerebellar arteries: Normal. --Posterior inferior cerebellar arteries: Normal. Venous sinuses: As permitted by contrast timing, patent. Anatomic variants: None Delayed phase: No parenchymal contrast enhancement. Review of the MIP images confirms the above findings IMPRESSION: 1. No intracranial arterial occlusion or high-grade stenosis. 2. Bilateral carotid bifurcation atherosclerotic calcification without hemodynamically significant stenosis. 3. Moderate narrowing of the right vertebral artery origin due to atherosclerotic calcification. Otherwise normal vertebral arteries. 4. **An incidental finding of potential clinical significance has been found. 2.4 cm partially calcified left thyroid nodule. Recommend dedicated thyroid ultrasound on a nonemergent, outpatient basis.** Electronically Signed   By: Ulyses Jarred M.D.   On: 10/28/2016 22:58   Ct Angio Neck and head W Or Wo Contrast 10/28/2016 IMPRESSION: 1. No intracranial arterial occlusion or high-grade stenosis. 2. Bilateral carotid bifurcation atherosclerotic calcification without hemodynamically significant stenosis. 3. Moderate narrowing of the right vertebral artery origin due to atherosclerotic calcification. Otherwise normal vertebral arteries. 4. **An incidental finding of potential clinical significance has been found. 2.4 cm partially calcified left thyroid nodule. Recommend dedicated thyroid ultrasound on a nonemergent, outpatient basis  TTE Left ventricle: The cavity size was normal. There was severe   concentric hypertrophy. Systolic function was mildly reduced. The   estimated ejection fraction was in the range of 45% to 50%. Mild   hypokinesis of the basal to mid anterior, anteroseptal  and   inferoseptal walls. Doppler parameters are consistent with   abnormal left ventricular relaxation (grade 1 diastolic   dysfunction). - Aortic valve: Transvalvular velocity was within the normal range.   There was no stenosis. There was no regurgitation. - Mitral valve: There was trivial regurgitation. - Left atrium: The atrium was mildly dilated. - Right ventricle: The cavity size was normal. Wall thickness was   normal. Systolic function was normal.   PHYSICAL EXAM  Temp:  [97.3 F (36.3 C)-97.6 F (36.4 C)] 97.3 F (36.3 C) (01/15 1430) Pulse Rate:  [56-96] 96 (01/15 1531) Resp:  [12-18] 18 (01/15 1531) BP: (55-168)/(31-93) 135/60 (01/15 1531) SpO2:  [95 %-100 %] 100 % (01/15 1531) Weight:  [214 lb 11.7 oz (97.4 kg)-220 lb 14.4 oz (100.2 kg)] 214 lb 11.7 oz (97.4 kg) (01/15 1430)  General - Well nourished, well developed, in no apparent distress.  Ophthalmologic - Fundi not visualized due to noncooperation.  Cardiovascular - Regular rate and rhythm.  Mental Status -  Level of arousal and orientation to time, place, and person were intact. Language including expression, naming, repetition, comprehension was assessed and found intact, however severe dysarthria Fund of Knowledge was assessed and was intact.  Cranial Nerves II - XII - II - Visual field intact OU. III, IV, VI - Extraocular movements intact. V - Facial sensation intact bilaterally. VII - Facial movement intact bilaterally. VIII - Hearing & vestibular intact bilaterally. X - Palate elevates symmetrically, severe dysarthria. XI - Chin turning & shoulder shrug intact bilaterally. XII - Tongue protrusion intact.  Motor Strength - The patient's  strength was 4/5 BUE proximal, 5/5 distally. BLE 4/5 and pronator drift was absent.  Bulk was normal and fasciculations were absent.   Motor Tone - Muscle tone was assessed at the neck and appendages and was normal.  Reflexes - The patient's reflexes were 1+ in all  extremities and he had no pathological reflexes.  Sensory - Light touch, temperature/pinprick were assessed and were symmetrical.    Coordination - The patient had mild dysmetria bilateral FTN, but seems proportional to mild BUE weakness.  Tremor was absent.  Gait and Station - deferred.   ASSESSMENT/PLAN [name] is a 71 y.o. male with history of diabetes mellitus, previous strokes, retention, hyperlipidemia, hepatitis C, gout, end-stage renal disease, ulnar artery disease, COPD, and congestive heart failure presenting with left lower extremity weakness. He did not receive IV t-PA due to late presentation.  Possible stroke    Resultant  deficits resolved  Repeat Head CT -  No acute abnormality.   CTA head and neck - unremarkable  2D Echo - EF 45-50%  LDL - 50  HgbA1c - 8.7  VTE prophylaxis - subcutaneous heparin  aspirin 81 mg daily and clopidogrel 75 mg daily prior to admission, now on aspirin 81 mg daily and clopidogrel 75 mg daily. Continue DAPT  Patient counseled to be compliant with his antithrombotic medications  Ongoing aggressive stroke risk factor management  Therapy recommendations: Home health PT recommended. No follow-up OT.  Disposition:  Pending  History of stroke  06/2015 presumed pontine infarct with double vision and ocular movement abnormalities  CUS unremarkable, TTE 45-50%, LDL 36 and A1C 7.7  Put on ASA and plavix at that time.  Hypertension  Stable  Permissive hypertension (OK if <220/120) for 24-48 hours post stroke and then gradually normalized within 5-7 days.  Long-term BP goal normotensive  Hyperlipidemia  Home meds:  Atorvastatin 40 mg daily resumed in hospital  LDL 50, goal < 70  Continue statin at discharge  Diabetes  HgbA1c 8.7, goal < 7.0  Uncontrolled  SSI   On lantus  CBG monitoring  Other Stroke Risk Factors  Advanced age  Obesity, Body mass index is 31.71 kg/m., recommend weight loss, diet and  exercise as appropriate   CAD  CHF   Other Active Problems  Seizure history - On Keppra   ESRD on HD  Hepatitis C  COPD  Follows with Dr. Tomi Likens at [facility] day # 1  Neurology will sign off. Please call with questions. Pt will follow up with Dr. Tomi Likens at [facility] in about 4 weeks. Thanks for the consult.   Rosalin Hawking, MD PhD Stroke Neurology 10/29/2016 10:05 PM   To contact Stroke Continuity provider, please refer to [email or web address] After hours, contact General Neurology

## 2016-10-29 NOTE — Progress Notes (Signed)
Notified Dr. Danford Bad of hypotensive events in HD. Pt. Remains asymptomatic, BP on arrival back to unit is 135/60, pulse 96 NSR. Will continue with discharge once the patient's wife arrives.

## 2016-10-29 NOTE — Progress Notes (Signed)
   Subjective: Ernest [name] was seen and evaluated today at bedside. Reports he is feeling well without further episodes of weakness. States he was due for dialysis this morning and wonders if he'll be able to get it while here.  Objective:  Vital signs in last 24 hours: Vitals:   10/28/16 1633 10/28/16 2022 10/28/16 2316 10/29/16 0538  BP: (!) 142/56 (!) 151/56 (!) 151/57 (!) 141/51  Pulse: 77 64 69 65  Resp: 20 16 16 16   Temp: 98.2 F (36.8 C) 97.5 F (36.4 C) 97.3 F (36.3 C) 97.4 F (36.3 C)  TempSrc: Oral Oral Oral Oral  SpO2: 97% 98% 99% 95%  Weight:      Height:       General: Very pleasant african [place] male resting comfortably in bed. In no acute distress. HENT: EOMI. No conjunctival injection, icterus or ptosis.  Cardiovascular: Regular rate and rhythm. No murmur or rub appreciated. Pulmonary: CTA BL. Unlabored breathing.  Abdomen: Soft, non-tender and non-distended. +bowel sounds.  Skin: Warm, dry. No cyanosis.  Neuro: Strength and sensation grossly intact.  Psych: Mood normal and affect was mood congruent. Responds to questions appropriately.   Assessment/Plan:  Active Problems:   Uncontrolled type 2 diabetes mellitus with end-stage renal disease ([place])   Hyperlipidemia associated with type 2 diabetes mellitus ([place])   ESRD (end stage renal disease) ([place])   Hypertension due to end stage renal disease caused by type 2 diabetes mellitus, on dialysis ([facility])   Chronic combined systolic and diastolic CHF (congestive heart failure) ([facility])   TIA (transient ischemic attack)   Left leg weakness  Left Leg Weakness Contacted by neurologist last night around 4-5 pm who recommended full work-up with CT angio head/neck + MRI. MRI unable to be obtained per radiologist due to metal implant and was subsequently canceled. CTA without flow-limiting occlusion.  -ECHO -Continue duel platelet with ASA and Plavix 75 mg -Continue Atorvastatin 40 mg daily  ESRD on HD MWF Pt  initially reported that he gets dialyzed in the afternoon however after calling his HD center [facility]), they report he was scheduled for 6 am today. Initial plan was to d/c home so he may go to dialysis however his clinic was unable to make an appt for him this afternoon. Dr. Justin Mend was consulted from nephro who will graciously dialyze him today prior to discharge.   Dispo: Anticipated discharge today after HD.   Ernest Ice, DO 10/29/2016, 7:11 AM Pager: [phone]

## 2016-10-29 NOTE — Consult Note (Signed)
Referring Provider: No ref. provider found Primary Care Physician:  Albin Felling, MD Primary Nephrologist:  Dr. Justin Mend   Reason for Consultation:   Medical management of end stage renal disease  Hypertension  Anemia and Secondary hyperparathyroidism   HPI:  71 year old gentleman with a medical history of end-stage renal disease on hemodialysis (M/W/F), hyperlipidemia, type 2 diabetes, hypertension, COPD, gout, HFrEF (EF 45-50%), coronary artery disease, hepatitis C, penile implant (unable to get MRI) and prior CVA complicated by post stroke seizure who presents with left leg weakness which started on Thursday.   CT without contrast did not show any acute intracranial abnormality. Neurology was called and evaluated the patient and have decided to admit the patient for stroke workup given his previous CVA and multiple stroke risk factors.  End stage renal disease  MWF dialysis  [facility]   Dialysis through catheter as patient refuses to have catheter cannulated     Past Medical History:  Diagnosis Date  . Abnormal stress test    s/p cath November 2013 with modest disease involving the ostial left main, proximal LAD, proximal RCA - do not appear to be hemodynamically signficant; mild LV dysfunction  . Bacteremia   . Chronic systolic CHF (congestive heart failure) ([place])   . COPD (chronic obstructive pulmonary disease) ([place])   . Coronary artery disease    per cath report 2013  . Ejection fraction < 50%   . Erectile dysfunction    penile implant  . ESRD (end stage renal disease) ([place])    Started HD in [place] in 2009, ESRD was due to DM. Moved to [facility] in Dec 2009 and now gets dialysis at [facility] on a MWF schedule.     . Gout   . Gout    Hx: of  . Hepatitis C   . Hyperlipidemia   . Hypertension   . Obesity   . Retinopathy   . Shortness of breath    Hx: of with exertion  . Stroke ([place])   . Type II or unspecified type diabetes mellitus without mention of complication, not stated as  uncontrolled    adult onset    Past Surgical History:  Procedure Laterality Date  . AV FISTULA PLACEMENT Left 01/13/2013   Procedure: INSERTION OF ARTERIOVENOUS (AV) GORE-TEX GRAFT ARM;  Surgeon: Angelia Mould, MD;  Location: [place];  Service: Vascular;  Laterality: Left;  . AV FISTULA PLACEMENT Left 05/05/2013   Procedure: INSERTION OF LEFT UPPER ARM  ARTERIOVENOUS GORTEX GRAFT;  Surgeon: Angelia Mould, MD;  Location: [place];  Service: Vascular;  Laterality: Left;  . [place] REMOVAL Left 03/26/2013   Procedure: REMOVAL OF  NON INCORPORATED ARTERIOVENOUS GORETEX GRAFT ([place]) left arm * repair of  left brachial artery with vein patch angioplasty.;  Surgeon: Angelia Mould, MD;  Location: [place];  Service: Vascular;  Laterality: Left;  . CARDIAC CATHETERIZATION  August 26, 2012  . CATARACT EXTRACTION W/ INTRAOCULAR LENS  IMPLANT, BILATERAL    . COLONOSCOPY    . EMBOLECTOMY Right 08/27/2013   Procedure: EMBOLECTOMY;  Surgeon: Mal Misty, MD;  Location: [place];  Service: Vascular;  Laterality: Right;  Thrombectomy of Radial and ulnar artery.  [name] EYE SURGERY     laser.  and surgery for DM  . fistula     RUE and wrist  . INSERTION OF DIALYSIS CATHETER Right 01/01/2013   Procedure: INSERTION OF DIALYSIS CATHETER right  internal jugular;  Surgeon: Serafina Mitchell, MD;  [facility];  Service: Vascular;  Laterality: Right;  . INSERTION OF DIALYSIS CATHETER N/A 03/26/2013   Procedure: INSERTION OF DIALYSIS CATHETER Left internal jugular vein;  Surgeon: Angelia Mould, MD;  Location: [place];  Service: Vascular;  Laterality: N/A;  . LIGATION OF ARTERIOVENOUS  FISTULA Right 12/30/2013   Procedure: REMOVAL OF SEGMENT OF GORTEX GRAFT AND FISTULA  AND REPAIR OF BRACHIAL ARTERY;  Surgeon: Mal Misty, MD;  Location: [place];  Service: Vascular;  Laterality: Right;  . PENILE PROSTHESIS IMPLANT     1997.. no card  . TEE WITHOUT CARDIOVERSION N/A 06/11/2013   Procedure:  TRANSESOPHAGEAL ECHOCARDIOGRAM (TEE);  Surgeon: Larey Dresser, MD;  Location: [place];  Service: Cardiovascular;  Laterality: N/A;  . THROMBECTOMY W/ EMBOLECTOMY Left 03/26/2013   Procedure: Attempted thrombectomy of left arm arteriovenous goretex graft.;  Surgeon: Angelia Mould, MD;  Location: [place];  Service: Vascular;  Laterality: Left;  [name] VENOGRAM N/A 04/07/2013   Procedure: VENOGRAM;  Surgeon: Serafina Mitchell, MD;  Location: [facility] CATH LAB;  Service: Cardiovascular;  Laterality: N/A;    Prior to Admission medications   Medication Sig Start Date End Date Taking? Authorizing Provider  allopurinol (ZYLOPRIM) 100 MG tablet Take 1 tablet (100 mg total) by mouth daily. 04/16/16  Yes Carly Montey Hora, MD  amitriptyline (ELAVIL) 25 MG tablet TAKE ONE TABLET BY MOUTH AT BEDTIME FOR  NEUROPATHY 04/16/16  Yes Juliet Rude, MD  aspirin EC 81 MG tablet Take 1 tablet (81 mg total) by mouth daily. 11/29/15  Yes Carly Montey Hora, MD  atorvastatin (LIPITOR) 40 MG tablet Take 1 tablet (40 mg total) by mouth daily. 11/29/15  Yes Carly Montey Hora, MD  CALCIUM PO Take 1 tablet by mouth daily.   Yes Historical Provider, MD  cinacalcet (SENSIPAR) 60 MG tablet Take 1 tablet (60 mg total) by mouth 2 (two) times daily. 11/29/15  Yes Carly Montey Hora, MD  clopidogrel (PLAVIX) 75 MG tablet Take 1 tablet (75 mg total) by mouth daily. 11/29/15  Yes Juliet Rude, MD  colchicine 0.6 MG tablet 1 per day on Monday and Friday 04/16/16  Yes Carly J Rivet, MD  insulin glargine (LANTUS) 100 UNIT/ML injection Inject 0.1 mLs (10 Units total) into the skin at bedtime. 11/29/15  Yes Carly J Rivet, MD  insulin lispro (HUMALOG) 100 UNIT/ML KiwkPen Inject 0.03 mLs (3 Units total) into the skin 3 (three) times daily with meals. 11/29/15  Yes Carly Montey Hora, MD  levETIRAcetam (KEPPRA) 500 MG tablet Take 2 tablets (1,000 mg total) by mouth daily. Then take extra 500 mg (1 tablet) on MWF after HD 11/29/15  Yes Carly J Rivet, MD  lisinopril  (PRINIVIL,ZESTRIL) 20 MG tablet Take 1 tablet (20 mg total) by mouth at bedtime. 11/29/15  Yes Carly Montey Hora, MD  metoprolol tartrate (LOPRESSOR) 25 MG tablet Take 1 tablet (25 mg total) by mouth 2 (two) times daily. Patient taking differently: Take 50 mg by mouth daily.  11/29/15  Yes Carly Montey Hora, MD  multivitamin (RENA-VIT) TABS tablet Take 1 tablet by mouth daily. 11/29/15  Yes Carly Montey Hora, MD  sevelamer carbonate (RENVELA) 800 MG tablet Take 1 tablet (800 mg total) by mouth 3 (three) times daily with meals. 11/29/15  Yes Juliet Rude, MD  ACCU-CHEK FASTCLIX LANCETS MISC Check blood sugar 4 times a day before meals and bedtime 11/29/15   Carly J Rivet, MD  glucose blood (ACCU-CHEK AVIVA PLUS) test strip Check  blood sugar 4 times a day before meals and bedtime 11/29/15   Juliet Rude, MD  Insulin Pen Needle 32G X 5 MM MISC Please use 1 pen needle for each injection 11/29/15   Juliet Rude, MD    Current Facility-Administered Medications  Medication Dose Route Frequency Provider Last Rate Last Dose  . acetaminophen (TYLENOL) tablet 650 mg  650 mg Oral Q4H PRN Zada Finders, MD       Or  . acetaminophen (TYLENOL) solution 650 mg  650 mg Per Tube Q4H PRN Zada Finders, MD       Or  . acetaminophen (TYLENOL) suppository 650 mg  650 mg Rectal Q4H PRN Zada Finders, MD      . allopurinol (ZYLOPRIM) tablet 100 mg  100 mg Oral Daily Zada Finders, MD   100 mg at 10/28/16 0931  . aspirin EC tablet 81 mg  81 mg Oral Daily Zada Finders, MD   81 mg at 10/28/16 0930  . atorvastatin (LIPITOR) tablet 40 mg  40 mg Oral Daily Zada Finders, MD   40 mg at 10/28/16 0930  . cinacalcet (SENSIPAR) tablet 60 mg  60 mg Oral BID WC Zada Finders, MD   60 mg at 10/29/16 0821  . clopidogrel (PLAVIX) tablet 75 mg  75 mg Oral Daily Zada Finders, MD   75 mg at 10/28/16 0931  . heparin injection 5,000 Units  5,000 Units Subcutaneous Q8H Zada Finders, MD   5,000 Units at 10/29/16 0555  . insulin aspart (novoLOG) injection 0-9  Units  0-9 Units Subcutaneous TID WC Zada Finders, MD   1 Units at 10/28/16 1715  . insulin glargine (LANTUS) injection 10 Units  10 Units Subcutaneous QHS Zada Finders, MD   10 Units at 10/28/16 2304  . levETIRAcetam (KEPPRA) tablet 1,000 mg  1,000 mg Oral Daily Zada Finders, MD   1,000 mg at 10/28/16 0930  . levETIRAcetam (KEPPRA) tablet 500 mg  500 mg Oral Once per day on Mon Wed Fri Axel Filler, MD      . multivitamin (RENA-VIT) tablet 1 tablet  1 tablet Oral Daily Zada Finders, MD   1 tablet at 10/28/16 0931  . senna-docusate (Senokot-S) tablet 1 tablet  1 tablet Oral QHS PRN Zada Finders, MD      . sevelamer carbonate (RENVELA) tablet 800 mg  800 mg Oral TID WC Zada Finders, MD   800 mg at 10/29/16 0821    Allergies as of 10/27/2016  . ([name])    Family History  Problem Relation Age of Onset  . Heart disease Father   . Diabetes Sister   . Diabetes Brother   . Diabetes Son     Social History   Social History  . Marital status: Married    Spouse name: N/A  . Number of children: N/A  . Years of education: N/A   Occupational History  . [employer] under[employer] Disabled    disabled   Social History Main Topics  . Smoking status: Former Smoker    Years: 7.00    Quit date: 06/10/1973  . Smokeless tobacco: Never Used     Comment: Quit in 1974.  [name] Alcohol use No  . Drug use: No  . Sexual activity: Not on file   Other Topics Concern  . Not on file   Social History Narrative   Adopted mentally retarded child at home, 2 adopted children, 3 living and 2 deceased.    Review of Systems: Gen: Denies  any fever, chills, sweats, anorexia, fatigue, weakness, malaise, weight loss, and sleep disorder HEENT: No visual complaints, No history of Retinopathy. Normal external appearance No Epistaxis or Sore throat. No sinusitis.   CV: Denies chest pain, angina, palpitations, syncope, orthopnea, PND, peripheral edema, and claudication. Resp: Denies dyspnea at rest, dyspnea  with exercise, cough, sputum, wheezing, coughing up blood, and pleurisy. GI: Denies vomiting blood, jaundice, and fecal incontinence.   Denies dysphagia or odynophagia. GU : Denies urinary burning, blood in urine, urinary frequency, urinary hesitancy, nocturnal urination, and urinary incontinence.  No renal calculi. MS: Denies joint pain, limitation of movement, and swelling, stiffness, low back pain, extremity pain. Denies muscle weakness, cramps, atrophy.  No use of non steroidal antiinflammatory drugs. Derm: Denies rash, itching, dry skin, hives, moles, warts, or unhealing ulcers.  Psych: Denies depression, anxiety, memory loss, suicidal ideation, hallucinations, paranoia, and confusion. Heme: Denies bruising, bleeding, and enlarged lymph nodes. Neuro:As per HPI  Endocrine  Diabetes  No Thyroid disease.  No Adrenal disease.  Physical Exam: Vital signs in last 24 hours: Temp:  [97.3 F (36.3 C)-98.4 F (36.9 C)] 97.6 F (36.4 C) (01/15 0938) Pulse Rate:  [64-77] 73 (01/15 0938) Resp:  [16-20] 18 (01/15 0938) BP: (122-151)/(48-61) 130/48 (01/15 0938) SpO2:  [95 %-99 %] 98 % (01/15 0938)   General:   Alert,  Well-developed, well-nourished, pleasant and cooperative in NAD Head:  Normocephalic and atraumatic. Eyes:  Sclera clear, no icterus.   Conjunctiva pink. Ears:  Normal auditory acuity. Nose:  No deformity, discharge,  or lesions. Mouth:  No deformity or lesions, dentition normal. Neck:  Supple; no masses or thyromegaly. JVP not elevated Lungs:  Clear throughout to auscultation.   No wheezes, crackles, or rhonchi. No acute distress. Heart:  Regular rate and rhythm; no murmurs, clicks, rubs,  or gallops. Abdomen:  Soft, nontender and nondistended. No masses, hepatosplenomegaly or hernias noted. Normal bowel sounds, without guarding, and without rebound.   Msk:  Symmetrical without gross deformities. Normal posture. Pulses:  No carotid, renal, femoral bruits. DP and PT symmetrical  and equal Extremities:  Without clubbing or edema. Neurologic:  Alert and  oriented x4;  grossly normal neurologically. Skin:  Intact without significant lesions or rashes. Cervical Nodes:  No significant cervical adenopathy. Psych:  Alert and cooperative. Normal mood and affect.  Intake/Output from previous day: No intake/output data recorded. Intake/Output this shift: Total I/O In: 240 [P.O.:240] Out: -   Lab Results:  Recent Labs  10/27/16 1612 10/27/16 1626  WBC 7.1  --   HGB 11.9* 12.2*  HCT 35.3* 36.0*  PLT 167  --    BMET  Recent Labs  10/27/16 1612 10/27/16 1626  NA 139 136  K 5.1 4.7  CL 94* 93*  CO2 28  --   GLUCOSE 263* 264*  BUN 54* 49*  CREATININE 9.58* 9.50*  CALCIUM 8.8*  --    LFT  Recent Labs  10/27/16 1612  PROT 6.4*  ALBUMIN 3.5  AST 17  ALT 16*  ALKPHOS 56  BILITOT 0.6   PT/INR  Recent Labs  10/27/16 1612  LABPROT 13.8  INR 1.06   Hepatitis Panel No results for input(s): HEPBSAG, HCVAB, HEPAIGM, HEPBIGM in the last 72 hours.  Studies/Results: Ct Angio Head W Or Wo Contrast  Result Date: 10/28/2016 CLINICAL DATA:  Stroke EXAM: CT ANGIOGRAPHY HEAD AND NECK TECHNIQUE: Multidetector CT imaging of the head and neck was performed using the standard protocol during bolus administration of intravenous contrast.  Multiplanar CT image reconstructions and MIPs were obtained to evaluate the vascular anatomy. Carotid stenosis measurements (when applicable) are obtained utilizing NASCET criteria, using the distal internal carotid diameter as the denominator. CONTRAST:  50 mL Isovue 370 IV COMPARISON:  Head CT 10/28/2016 FINDINGS: CTA NECK FINDINGS Aortic arch: There is no aneurysm or dissection of the visualized ascending aorta or aortic arch. There is a normal 3 vessel branching pattern. The visualized proximal subclavian arteries are normal. There is mild atherosclerotic calcification in the aortic arch. Right carotid system: There is  atherosclerotic calcification at the carotid bifurcation and within the proximal right internal carotid artery, without hemodynamically significant stenosis. No aneurysm or dissection. Left carotid system: There is atherosclerotic calcification at the left carotid bifurcation without hemodynamically significant stenosis. No aneurysm or dissection. Vertebral arteries: The vertebral system is codominant. There is moderate narrowing of the right vertebral origin secondary to atherosclerotic calcification. The left vertebral artery origin is normal. Both vertebral arteries are otherwise normal to their confluence with the basilar artery. Skeleton: There is no bony spinal canal stenosis. No lytic or blastic lesions. Other neck: There is a partially calcified left thyroid nodule measuring 2.4 cm. Upper chest: No pneumothorax or pleural effusion. No nodules or masses. Review of the MIP images confirms the above findings CTA HEAD FINDINGS Anterior circulation: --Intracranial internal carotid arteries: Normal. --Anterior cerebral arteries: Hypoplastic right A1 segment, a normal variant. The anterior cerebral arteries are otherwise normal. --Middle cerebral arteries: Normal. --Posterior communicating arteries: Absent bilaterally. Posterior circulation: --Posterior cerebral arteries: Normal. --Superior cerebellar arteries: Normal. --Basilar artery: Normal. --Anterior inferior cerebellar arteries: Normal. --Posterior inferior cerebellar arteries: Normal. Venous sinuses: As permitted by contrast timing, patent. Anatomic variants: None Delayed phase: No parenchymal contrast enhancement. Review of the MIP images confirms the above findings IMPRESSION: 1. No intracranial arterial occlusion or high-grade stenosis. 2. Bilateral carotid bifurcation atherosclerotic calcification without hemodynamically significant stenosis. 3. Moderate narrowing of the right vertebral artery origin due to atherosclerotic calcification. Otherwise normal  vertebral arteries. 4. **An incidental finding of potential clinical significance has been found. 2.4 cm partially calcified left thyroid nodule. Recommend dedicated thyroid ultrasound on a nonemergent, outpatient basis.** Electronically Signed   By: Ulyses Jarred M.D.   On: 10/28/2016 22:58   Ct Head Wo Contrast  Result Date: 10/28/2016 CLINICAL DATA:  Followup stroke. EXAM: CT HEAD WITHOUT CONTRAST TECHNIQUE: Contiguous axial images were obtained from the base of the skull through the vertex without intravenous contrast. COMPARISON:  Yesterday. FINDINGS: Brain: Diffusely enlarged ventricles and subarachnoid spaces. Patchy white matter low density in both cerebral hemispheres. No intracranial hemorrhage, mass lesion or CT evidence of acute infarction. Vascular: No hyperdense vessel or unexpected calcification. Skull: Normal. Negative for fracture or focal lesion. Sinuses/Orbits: No acute finding. Other: None. IMPRESSION: No acute abnormality. Stable atrophy and chronic small vessel white matter ischemic changes. Electronically Signed   By: Claudie Revering M.D.   On: 10/28/2016 07:44   Ct Angio Neck W Or Wo Contrast  Result Date: 10/28/2016 CLINICAL DATA:  Stroke EXAM: CT ANGIOGRAPHY HEAD AND NECK TECHNIQUE: Multidetector CT imaging of the head and neck was performed using the standard protocol during bolus administration of intravenous contrast. Multiplanar CT image reconstructions and MIPs were obtained to evaluate the vascular anatomy. Carotid stenosis measurements (when applicable) are obtained utilizing NASCET criteria, using the distal internal carotid diameter as the denominator. CONTRAST:  50 mL Isovue 370 IV COMPARISON:  Head CT 10/28/2016 FINDINGS: CTA NECK FINDINGS Aortic arch: There is no  aneurysm or dissection of the visualized ascending aorta or aortic arch. There is a normal 3 vessel branching pattern. The visualized proximal subclavian arteries are normal. There is mild atherosclerotic  calcification in the aortic arch. Right carotid system: There is atherosclerotic calcification at the carotid bifurcation and within the proximal right internal carotid artery, without hemodynamically significant stenosis. No aneurysm or dissection. Left carotid system: There is atherosclerotic calcification at the left carotid bifurcation without hemodynamically significant stenosis. No aneurysm or dissection. Vertebral arteries: The vertebral system is codominant. There is moderate narrowing of the right vertebral origin secondary to atherosclerotic calcification. The left vertebral artery origin is normal. Both vertebral arteries are otherwise normal to their confluence with the basilar artery. Skeleton: There is no bony spinal canal stenosis. No lytic or blastic lesions. Other neck: There is a partially calcified left thyroid nodule measuring 2.4 cm. Upper chest: No pneumothorax or pleural effusion. No nodules or masses. Review of the MIP images confirms the above findings CTA HEAD FINDINGS Anterior circulation: --Intracranial internal carotid arteries: Normal. --Anterior cerebral arteries: Hypoplastic right A1 segment, a normal variant. The anterior cerebral arteries are otherwise normal. --Middle cerebral arteries: Normal. --Posterior communicating arteries: Absent bilaterally. Posterior circulation: --Posterior cerebral arteries: Normal. --Superior cerebellar arteries: Normal. --Basilar artery: Normal. --Anterior inferior cerebellar arteries: Normal. --Posterior inferior cerebellar arteries: Normal. Venous sinuses: As permitted by contrast timing, patent. Anatomic variants: None Delayed phase: No parenchymal contrast enhancement. Review of the MIP images confirms the above findings IMPRESSION: 1. No intracranial arterial occlusion or high-grade stenosis. 2. Bilateral carotid bifurcation atherosclerotic calcification without hemodynamically significant stenosis. 3. Moderate narrowing of the right vertebral  artery origin due to atherosclerotic calcification. Otherwise normal vertebral arteries. 4. **An incidental finding of potential clinical significance has been found. 2.4 cm partially calcified left thyroid nodule. Recommend dedicated thyroid ultrasound on a nonemergent, outpatient basis.** Electronically Signed   By: Ulyses Jarred M.D.   On: 10/28/2016 22:58   Ct Head Code Stroke W/o Cm  Result Date: 10/27/2016 CLINICAL DATA:  Code stroke. Last seen normal at 12 noon. LEFT leg weakness. EXAM: CT HEAD WITHOUT CONTRAST TECHNIQUE: Contiguous axial images were obtained from the base of the skull through the vertex without intravenous contrast. COMPARISON:  CT head 01/1916. FINDINGS: Brain: No evidence for acute infarction, hemorrhage, mass lesion, hydrocephalus, or extra-axial fluid. Generalized atrophy. Hypoattenuation of white matter, likely chronic microvascular ischemic change. Incidental chronic mineralization of the basal ganglia and deep white matter, unchanged from most recent prior scan. Vascular: No hyperdense vessel. Advanced calcification in the carotid siphon and distal vertebral regions. Skull: Normal. Negative for fracture or focal lesion. Sinuses/Orbits: No acute finding. Other: None. ASPECTS [facility] Stroke Program Early CT Score) - Ganglionic level infarction (caudate, lentiform nuclei, internal capsule, insula, M1-M3 cortex): 7 - Supraganglionic infarction (M4-M6 cortex): 3 Total score (0-10 with 10 being normal): 10 IMPRESSION: 1. Atrophy and small vessel disease. No acute intracranial findings. 2. ASPECTS is 10. A call was placed by telephone at the time of interpretation on 10/27/2016 at 4:06 pm to Dr. Alver Fisher. Electronically Signed   By: Staci Righter M.D.   On: 10/27/2016 16:08    Assessment/Plan:  ESRD-MWF dialysis will place patient on schedule today  ANEMIA- Hb above 12  No ESA   MBD- calcium 8.8    Phos pending   HTN/VOL- stable will plan to challenge EDW   ACCESS- catheter  refuses cannulation of AVG   OTHER-  TIA   ASA and plavix therapy  ECHO  Pending  Atorvastatin    No intracranial arterial occlusion or high-grade stenosis.     Diabetes per primary team     Thyroid nodule left   Recommended ultrasound as outpatient - will defer to primary care    LOS: 0 Roselinda Bahena W @TODAY @9 :47 AM

## 2016-10-29 NOTE — Discharge Summary (Signed)
Name: Ernest [name] MRN: [id number] DOB: 1946/08/11 71 y.o. PCP: Juliet Rude, MD  Date of Admission: 10/27/2016  3:22 PM Date of Discharge: 10/29/2016 Attending Physician: Axel Filler, MD  Discharge Diagnosis: 1. Left Leg Weakness 2. ESRD on HD 3. Calcified thyroid nodule  Thyroid Nodule Active Problems:   Uncontrolled type 2 diabetes mellitus with end-stage renal disease ([place])   Hyperlipidemia associated with type 2 diabetes mellitus ([place])   ESRD (end stage renal disease) ([place])   Hypertension due to end stage renal disease caused by type 2 diabetes mellitus, on dialysis (HCC)   Chronic combined systolic and diastolic CHF (congestive heart failure) (HCC)   TIA (transient ischemic attack)   Left leg weakness   Discharge Medications: Allergies as of 10/29/2016   [name]     Medication List    STOP taking these medications   lisinopril 20 MG tablet Commonly known as:  PRINIVIL,ZESTRIL     TAKE these medications   ACCU-CHEK FASTCLIX LANCETS Misc Check blood sugar 4 times a day before meals and bedtime   allopurinol 100 MG tablet Commonly known as:  ZYLOPRIM Take 1 tablet (100 mg total) by mouth daily.   amitriptyline 25 MG tablet Commonly known as:  ELAVIL TAKE ONE TABLET BY MOUTH AT BEDTIME FOR  NEUROPATHY   aspirin EC 81 MG tablet Take 1 tablet (81 mg total) by mouth daily.   atorvastatin 40 MG tablet Commonly known as:  LIPITOR Take 1 tablet (40 mg total) by mouth daily.   CALCIUM PO Take 1 tablet by mouth daily.   cinacalcet 60 MG tablet Commonly known as:  SENSIPAR Take 1 tablet (60 mg total) by mouth 2 (two) times daily.   clopidogrel 75 MG tablet Commonly known as:  PLAVIX Take 1 tablet (75 mg total) by mouth daily.   colchicine 0.6 MG tablet 1 per day on Monday and Friday   glucose blood test strip Commonly known as:  ACCU-CHEK AVIVA PLUS Check blood sugar 4 times a day before meals and bedtime   insulin glargine  100 UNIT/ML injection Commonly known as:  LANTUS Inject 0.1 mLs (10 Units total) into the skin at bedtime.   insulin lispro 100 UNIT/ML KiwkPen Commonly known as:  HUMALOG Inject 0.03 mLs (3 Units total) into the skin 3 (three) times daily with meals.   Insulin Pen Needle 32G X 5 MM Misc Please use 1 pen needle for each injection   levETIRAcetam 500 MG tablet Commonly known as:  KEPPRA Take 2 tablets (1,000 mg total) by mouth daily. Then take extra 500 mg (1 tablet) on MWF after HD   metoprolol tartrate 25 MG tablet Commonly known as:  LOPRESSOR Take 1 tablet (25 mg total) by mouth 2 (two) times daily. What changed:  how much to take  when to take this   multivitamin Tabs tablet Take 1 tablet by mouth daily.   sevelamer carbonate 800 MG tablet Commonly known as:  RENVELA Take 1 tablet (800 mg total) by mouth 3 (three) times daily with meals.            Durable Medical Equipment        Start     Ordered   10/28/16 1503  For home use only DME Walker rolling  Once    Question:  Patient needs a walker to treat with the following condition  Answer:  CVA (cerebral vascular accident) ([place])   10/28/16 1503      Disposition  and follow-up:   ErnestErnest [name] was discharged from [facility] in stable condition.  At the hospital follow up visit please address:  1.  Left lower extremity weakness: Has he had any more episodes of weakness? Are they associated with hypotension and or dialysis? Still compliant with dual antiplatelet therapy? ESRD: is he compliant with HD sessions? Calcified Thyroid Nodule: Seen on CT. Will require non-emergent Thyroid US to further characterize.   2.  Labs / imaging needed at time of follow-up: Echo. Thyroid US.  3.  Pending labs/ test needing follow-up: none  Follow-up Appointments: Follow-up Information    Rivet, Carly, MD. Schedule an appointment as soon as possible for a visit in 1 week(s).   Specialty:  Internal  Medicine Contact information: [place] [address] [phone]        Louis Meckel, MD Follow up.   Specialty:  Nephrology Contact information: [place] [address] [phone]        [place]. Schedule an appointment as soon as possible for a visit in 1 week(s).   Contact information: [place], [place] Hospital Course by problem list: Active Problems:   Uncontrolled type 2 diabetes mellitus with end-stage renal disease ([place])   Hyperlipidemia associated with type 2 diabetes mellitus ([place])   ESRD (end stage renal disease) ([place])   Hypertension due to end stage renal disease caused by type 2 diabetes mellitus, on dialysis ([place])   Chronic combined systolic and diastolic CHF (congestive heart failure) (HCC)   TIA (transient ischemic attack)   Left leg weakness   1. Left Leg Weakness Ernest [name] presented 10/27/16 with a 2-3 day history of persistent left lower extremity weakness which had improved by the time of presentation. He has a history of CVA with residual dysarthria. He denied any other neurological symptoms including worsening dysarthria, other extremity weakness, numbness, headache, visual changes, chest, pain etc. Stat head CT negative for acute abnormality. Neurology was consulted and due to his multiple risk factors performed a full stroke work-up which was negative (minus MRI: patient has metal penile implant). Repeat head CT negative as well. Patient reports that he has transient left leg weakness following HD sessions and also reports hypotensive episodes during HD as well. LLE weakness likely secondary to hypotension during HD and antihypertensive medications were liberalized to prevent this in the future. He was also continued on dual antiplatelet therapy as well.   2. ESRD on HD MWF Nephrology was consulted who dialyzed the patient on Monday.    Discharge Vitals:   BP (!) 141/51 (BP Location: Right Arm)   Pulse 65   Temp 97.4 F (36.3 C) (Oral)   Resp 16   Ht 5\' 9"  (1.753 m)   Wt 212 lb 9.6 oz (96.4 kg)   SpO2 95%   BMI 31.40 kg/m   Pertinent Labs, Studies, and Procedures:  CT head: negative for acute process Repeat CT head: also negative MRI: unable to obtain due to patients metal implant Carotid dopplers: Negative Lipid panel: normal EKG: unchanged from priors.   Discharge Instructions: STOP taking Lisinopril 20 mg. Your left leg weakness was likely due to intermittent hypertension. Because of this, we are discontinuing one of your blood pressure medications. Please make an appointment with your PCP to discuss this admission and recent changes to your regimen. Please continue to follow up with hemodialysis  as scheduled. As always, return to ED or contact your physician if your symptoms worsen or if you develop new neurological problems.   SignedEinar Gip, DO 10/29/2016, 9:18 AM   Pager: [phone]

## 2016-10-29 NOTE — Progress Notes (Signed)
Late entry for missed gcode    10/28/16 1402  OT Time Calculation  OT Start Time (ACUTE ONLY) 1149  OT Stop Time (ACUTE ONLY) 1202  OT Time Calculation (min) 13 min  OT G-codes **NOT FOR INPATIENT CLASS**  Functional Assessment Tool Used Clinical judgement  Functional Limitation Self care  Self Care Current Status (Y1856) CI  Self Care Goal Status (D1497) CI  Self Care Discharge Status (W2637) CI  OT General Charges  $OT Visit 1 Procedure  OT Evaluation  $OT Eval Moderate Complexity 1 Procedure   Truman Hayward M.S., OTR/L Pager: [phone]

## 2016-10-29 NOTE — Progress Notes (Signed)
SLP Cancellation Note  Patient Details Name: Ernest [name] MRN: [id number] DOB: August 08, 1946   Cancelled treatment:        Pt is in HD therefore unable to initiate speech-language-cognition. Will initiate when able.   Houston Siren 10/29/2016, 1:31 PM   Orbie Pyo Colvin Caroli.Ed [employer] [phone]

## 2016-10-31 DIAGNOSIS — N186 End stage renal disease: Secondary | ICD-10-CM | POA: Diagnosis not present

## 2016-10-31 DIAGNOSIS — D631 Anemia in chronic kidney disease: Secondary | ICD-10-CM | POA: Diagnosis not present

## 2016-10-31 DIAGNOSIS — D509 Iron deficiency anemia, unspecified: Secondary | ICD-10-CM | POA: Diagnosis not present

## 2016-10-31 DIAGNOSIS — N2581 Secondary hyperparathyroidism of renal origin: Secondary | ICD-10-CM | POA: Diagnosis not present

## 2016-10-31 DIAGNOSIS — E119 Type 2 diabetes mellitus without complications: Secondary | ICD-10-CM | POA: Diagnosis not present

## 2016-11-01 DIAGNOSIS — Z7902 Long term (current) use of antithrombotics/antiplatelets: Secondary | ICD-10-CM | POA: Diagnosis not present

## 2016-11-01 DIAGNOSIS — I2510 Atherosclerotic heart disease of native coronary artery without angina pectoris: Secondary | ICD-10-CM | POA: Diagnosis not present

## 2016-11-01 DIAGNOSIS — M109 Gout, unspecified: Secondary | ICD-10-CM | POA: Diagnosis not present

## 2016-11-01 DIAGNOSIS — E1122 Type 2 diabetes mellitus with diabetic chronic kidney disease: Secondary | ICD-10-CM | POA: Diagnosis not present

## 2016-11-01 DIAGNOSIS — Z7982 Long term (current) use of aspirin: Secondary | ICD-10-CM | POA: Diagnosis not present

## 2016-11-01 DIAGNOSIS — I132 Hypertensive heart and chronic kidney disease with heart failure and with stage 5 chronic kidney disease, or end stage renal disease: Secondary | ICD-10-CM | POA: Diagnosis not present

## 2016-11-01 DIAGNOSIS — B182 Chronic viral hepatitis C: Secondary | ICD-10-CM | POA: Diagnosis not present

## 2016-11-01 DIAGNOSIS — J449 Chronic obstructive pulmonary disease, unspecified: Secondary | ICD-10-CM | POA: Diagnosis not present

## 2016-11-01 DIAGNOSIS — I5022 Chronic systolic (congestive) heart failure: Secondary | ICD-10-CM | POA: Diagnosis not present

## 2016-11-01 DIAGNOSIS — Z792 Long term (current) use of antibiotics: Secondary | ICD-10-CM | POA: Diagnosis not present

## 2016-11-01 DIAGNOSIS — Z992 Dependence on renal dialysis: Secondary | ICD-10-CM | POA: Diagnosis not present

## 2016-11-01 DIAGNOSIS — R569 Unspecified convulsions: Secondary | ICD-10-CM | POA: Diagnosis not present

## 2016-11-01 DIAGNOSIS — N186 End stage renal disease: Secondary | ICD-10-CM | POA: Diagnosis not present

## 2016-11-01 DIAGNOSIS — I69351 Hemiplegia and hemiparesis following cerebral infarction affecting right dominant side: Secondary | ICD-10-CM | POA: Diagnosis not present

## 2016-11-02 DIAGNOSIS — D509 Iron deficiency anemia, unspecified: Secondary | ICD-10-CM | POA: Diagnosis not present

## 2016-11-02 DIAGNOSIS — D631 Anemia in chronic kidney disease: Secondary | ICD-10-CM | POA: Diagnosis not present

## 2016-11-02 DIAGNOSIS — N2581 Secondary hyperparathyroidism of renal origin: Secondary | ICD-10-CM | POA: Diagnosis not present

## 2016-11-02 DIAGNOSIS — E119 Type 2 diabetes mellitus without complications: Secondary | ICD-10-CM | POA: Diagnosis not present

## 2016-11-02 DIAGNOSIS — N186 End stage renal disease: Secondary | ICD-10-CM | POA: Diagnosis not present

## 2016-11-05 DIAGNOSIS — E119 Type 2 diabetes mellitus without complications: Secondary | ICD-10-CM | POA: Diagnosis not present

## 2016-11-05 DIAGNOSIS — D631 Anemia in chronic kidney disease: Secondary | ICD-10-CM | POA: Diagnosis not present

## 2016-11-05 DIAGNOSIS — N186 End stage renal disease: Secondary | ICD-10-CM | POA: Diagnosis not present

## 2016-11-05 DIAGNOSIS — N2581 Secondary hyperparathyroidism of renal origin: Secondary | ICD-10-CM | POA: Diagnosis not present

## 2016-11-05 DIAGNOSIS — D509 Iron deficiency anemia, unspecified: Secondary | ICD-10-CM | POA: Diagnosis not present

## 2016-11-06 DIAGNOSIS — L910 Hypertrophic scar: Secondary | ICD-10-CM | POA: Diagnosis not present

## 2016-11-06 DIAGNOSIS — E1122 Type 2 diabetes mellitus with diabetic chronic kidney disease: Secondary | ICD-10-CM | POA: Diagnosis not present

## 2016-11-06 DIAGNOSIS — I69351 Hemiplegia and hemiparesis following cerebral infarction affecting right dominant side: Secondary | ICD-10-CM | POA: Diagnosis not present

## 2016-11-06 DIAGNOSIS — I132 Hypertensive heart and chronic kidney disease with heart failure and with stage 5 chronic kidney disease, or end stage renal disease: Secondary | ICD-10-CM | POA: Diagnosis not present

## 2016-11-06 DIAGNOSIS — I2510 Atherosclerotic heart disease of native coronary artery without angina pectoris: Secondary | ICD-10-CM | POA: Diagnosis not present

## 2016-11-06 DIAGNOSIS — I5022 Chronic systolic (congestive) heart failure: Secondary | ICD-10-CM | POA: Diagnosis not present

## 2016-11-06 DIAGNOSIS — N186 End stage renal disease: Secondary | ICD-10-CM | POA: Diagnosis not present

## 2016-11-07 DIAGNOSIS — D631 Anemia in chronic kidney disease: Secondary | ICD-10-CM | POA: Diagnosis not present

## 2016-11-07 DIAGNOSIS — E119 Type 2 diabetes mellitus without complications: Secondary | ICD-10-CM | POA: Diagnosis not present

## 2016-11-07 DIAGNOSIS — N2581 Secondary hyperparathyroidism of renal origin: Secondary | ICD-10-CM | POA: Diagnosis not present

## 2016-11-07 DIAGNOSIS — E1129 Type 2 diabetes mellitus with other diabetic kidney complication: Secondary | ICD-10-CM | POA: Diagnosis not present

## 2016-11-07 DIAGNOSIS — D509 Iron deficiency anemia, unspecified: Secondary | ICD-10-CM | POA: Diagnosis not present

## 2016-11-07 DIAGNOSIS — N186 End stage renal disease: Secondary | ICD-10-CM | POA: Diagnosis not present

## 2016-11-08 DIAGNOSIS — Z992 Dependence on renal dialysis: Secondary | ICD-10-CM | POA: Diagnosis not present

## 2016-11-08 DIAGNOSIS — I871 Compression of vein: Secondary | ICD-10-CM | POA: Diagnosis not present

## 2016-11-08 DIAGNOSIS — T82858A Stenosis of vascular prosthetic devices, implants and grafts, initial encounter: Secondary | ICD-10-CM | POA: Diagnosis not present

## 2016-11-08 DIAGNOSIS — N186 End stage renal disease: Secondary | ICD-10-CM | POA: Diagnosis not present

## 2016-11-09 DIAGNOSIS — E119 Type 2 diabetes mellitus without complications: Secondary | ICD-10-CM | POA: Diagnosis not present

## 2016-11-09 DIAGNOSIS — D509 Iron deficiency anemia, unspecified: Secondary | ICD-10-CM | POA: Diagnosis not present

## 2016-11-09 DIAGNOSIS — N186 End stage renal disease: Secondary | ICD-10-CM | POA: Diagnosis not present

## 2016-11-09 DIAGNOSIS — N2581 Secondary hyperparathyroidism of renal origin: Secondary | ICD-10-CM | POA: Diagnosis not present

## 2016-11-09 DIAGNOSIS — D631 Anemia in chronic kidney disease: Secondary | ICD-10-CM | POA: Diagnosis not present

## 2016-11-12 ENCOUNTER — Ambulatory Visit (INDEPENDENT_AMBULATORY_CARE_PROVIDER_SITE_OTHER): Payer: Medicare Other | Admitting: Neurology

## 2016-11-12 ENCOUNTER — Encounter: Payer: Self-pay | Admitting: Neurology

## 2016-11-12 VITALS — BP 150/78 | HR 73 | Ht 69.0 in | Wt 214.6 lb

## 2016-11-12 DIAGNOSIS — E119 Type 2 diabetes mellitus without complications: Secondary | ICD-10-CM | POA: Diagnosis not present

## 2016-11-12 DIAGNOSIS — IMO0002 Reserved for concepts with insufficient information to code with codable children: Secondary | ICD-10-CM

## 2016-11-12 DIAGNOSIS — G40209 Localization-related (focal) (partial) symptomatic epilepsy and epileptic syndromes with complex partial seizures, not intractable, without status epilepticus: Secondary | ICD-10-CM | POA: Diagnosis not present

## 2016-11-12 DIAGNOSIS — E785 Hyperlipidemia, unspecified: Secondary | ICD-10-CM

## 2016-11-12 DIAGNOSIS — N186 End stage renal disease: Secondary | ICD-10-CM | POA: Diagnosis not present

## 2016-11-12 DIAGNOSIS — E1122 Type 2 diabetes mellitus with diabetic chronic kidney disease: Secondary | ICD-10-CM | POA: Diagnosis not present

## 2016-11-12 DIAGNOSIS — I639 Cerebral infarction, unspecified: Secondary | ICD-10-CM | POA: Diagnosis not present

## 2016-11-12 DIAGNOSIS — E1165 Type 2 diabetes mellitus with hyperglycemia: Secondary | ICD-10-CM

## 2016-11-12 DIAGNOSIS — I10 Essential (primary) hypertension: Secondary | ICD-10-CM

## 2016-11-12 DIAGNOSIS — D509 Iron deficiency anemia, unspecified: Secondary | ICD-10-CM | POA: Diagnosis not present

## 2016-11-12 DIAGNOSIS — D631 Anemia in chronic kidney disease: Secondary | ICD-10-CM | POA: Diagnosis not present

## 2016-11-12 DIAGNOSIS — N2581 Secondary hyperparathyroidism of renal origin: Secondary | ICD-10-CM | POA: Diagnosis not present

## 2016-11-12 NOTE — Patient Instructions (Addendum)
1.  Continue aspirin and Plavix daily 2.  Continue cholesterol and blood pressure medication 3.  Try to work on getting diabetes under better control 4.  Continue leviteracetam 1000mg  daily and 500mg  after dialysis 5.  Mediterranean diet  Mediterranean Diet A Mediterranean diet refers to food and lifestyle choices that are based on the traditions of countries located on the [employer]. This way of eating has been shown to help prevent certain conditions and improve outcomes for people who have chronic diseases, like kidney disease and heart disease. What are tips for following this plan? Lifestyle  Cook and eat meals together with your family, when possible.  Drink enough fluid to keep your urine clear or pale yellow.  Be physically active every day. This includes:  Aerobic exercise like running or swimming.  Leisure activities like gardening, walking, or housework.  Get 7-8 hours of sleep each night.  If recommended by your health care provider, drink red wine in moderation. This means 1 glass a day for nonpregnant women and 2 glasses a day for men. A glass of wine equals 5 oz (150 mL). Reading food labels  Check the serving size of packaged foods. For foods such as rice and pasta, the serving size refers to the amount of cooked product, not dry.  Check the total fat in packaged foods. Avoid foods that have saturated fat or trans fats.  Check the ingredients list for added sugars, such as corn syrup. Shopping  At the grocery store, buy most of your food from the areas near the walls of the store. This includes:  Fresh fruits and vegetables (produce).  Grains, beans, nuts, and seeds. Some of these may be available in unpackaged forms or large amounts (in bulk).  Fresh seafood.  Poultry and eggs.  Low-fat dairy products.  Buy whole ingredients instead of prepackaged foods.  Buy fresh fruits and vegetables in-season from local farmers markets.  Buy frozen fruits  and vegetables in resealable bags.  If you do not have access to quality fresh seafood, buy precooked frozen shrimp or canned fish, such as tuna, salmon, or sardines.  Buy small amounts of raw or cooked vegetables, salads, or olives from the deli or salad bar at your store.  Stock your pantry so you always have certain foods on hand, such as olive oil, canned tuna, canned tomatoes, rice, pasta, and beans. Cooking  Cook foods with extra-virgin olive oil instead of using butter or other vegetable oils.  Have meat as a side dish, and have vegetables or grains as your main dish. This means having meat in small portions or adding small amounts of meat to foods like pasta or stew.  Use beans or vegetables instead of meat in common dishes like chili or lasagna.  Experiment with different cooking methods. Try roasting or broiling vegetables instead of steaming or sauteing them.  Add frozen vegetables to soups, stews, pasta, or rice.  Add nuts or seeds for added healthy fat at each meal. You can add these to yogurt, salads, or vegetable dishes.  Marinate fish or vegetables using olive oil, lemon juice, garlic, and fresh herbs. Meal planning  Plan to eat 1 vegetarian meal one day each week. Try to work up to 2 vegetarian meals, if possible.  Eat seafood 2 or more times a week.  Have healthy snacks readily available, such as:  Vegetable sticks with hummus.  Greek yogurt.  Fruit and nut trail mix.  Eat balanced meals throughout the week. This includes:  Fruit: 2-3 servings a day  Vegetables: 4-5 servings a day  Low-fat dairy: 2 servings a day  Fish, poultry, or lean meat: 1 serving a day  Beans and legumes: 2 or more servings a week  Nuts and seeds: 1-2 servings a day  Whole grains: 6-8 servings a day  Extra-virgin olive oil: 3-4 servings a day  Limit red meat and sweets to only a few servings a month What are my food choices?  Mediterranean  diet  Recommended  Grains: Whole-grain pasta. Brown rice. Bulgar wheat. Polenta. Couscous. Whole-wheat bread. [place].  Vegetables: Artichokes. Beets. Broccoli. Cabbage. Carrots. Eggplant. Green beans. Chard. Kale. Spinach. Onions. Leeks. Peas. Squash. Tomatoes. Peppers. Radishes.  Fruits: Apples. Apricots. Avocado. Berries. Bananas. Cherries. Dates. Figs. Grapes. Lemons. Melon. Oranges. Peaches. Plums. Pomegranate.  Meats and other protein foods: Beans. Almonds. Sunflower seeds. Pine nuts. Peanuts. [place]. Salmon. Scallops. Shrimp. [place]. Tilapia. Clams. Oysters. Eggs.  Dairy: Low-fat milk. Cheese. Greek yogurt.  Beverages: Water. Red wine. Herbal tea.  Fats and oils: Extra virgin olive oil. Avocado oil. Grape seed oil.  Sweets and desserts: [place] yogurt with honey. Baked apples. Poached pears. Trail mix.  Seasoning and other foods: Basil. Cilantro. Coriander. Cumin. Mint. Parsley. Sage. Rosemary. Tarragon. Garlic. Oregano. Thyme. Pepper. Balsalmic vinegar. Tahini. Hummus. Tomato sauce. Olives. Mushrooms.  Limit these  Grains: Prepackaged pasta or rice dishes. Prepackaged cereal with added sugar.  Vegetables: Deep fried potatoes (french fries).  Fruits: Fruit canned in syrup.  Meats and other protein foods: Beef. Pork. Lamb. Poultry with skin. Hot dogs. Berniece Salines.  Dairy: Ice cream. Sour cream. Whole milk.  Beverages: Juice. Sugar-sweetened soft drinks. Beer. Liquor and spirits.  Fats and oils: Butter. Canola oil. Vegetable oil. Beef fat (tallow). Lard.  Sweets and desserts: Cookies. Cakes. Pies. Candy.  Seasoning and other foods: Mayonnaise. Premade sauces and marinades.  The items listed may not be a complete list. Talk with your dietitian about what dietary choices are right for you. Summary  The Mediterranean diet includes both food and lifestyle choices.  Eat a variety of fresh fruits and vegetables, beans, nuts, seeds, and whole grains.  Limit the amount of red  meat and sweets that you eat.  Talk with your health care provider about whether it is safe for you to drink red wine in moderation. This means 1 glass a day for nonpregnant women and 2 glasses a day for men. A glass of wine equals 5 oz (150 mL). This information is not intended to replace advice given to you by your health care provider. Make sure you discuss any questions you have with your health care provider. Document Released: 05/24/2016 Document Revised: 06/26/2016 Document Reviewed: 05/24/2016 Elsevier Interactive Patient Education  2017 [place]. 6.  Follow up in 7 months.

## 2016-11-12 NOTE — Progress Notes (Signed)
NEUROLOGY FOLLOW UP OFFICE NOTE  Ernest DIGUGLIELMO [id number]  HISTORY OF PRESENT ILLNESS: Ernest [name] is a 71 year old right-handed man with hyperlipidemia, ESRD on HD (M/W/F), type II diabetes mellitus, hypertension, COPD, gout, chronic CHF (EF 30-35%), coronary artery disease, hepatitis C, penile implant (unable to get MRI) and history of stroke who follows up for stroke and seizures.     UPDATE:  He was admitted to [facility] from 10/27/16 to 10/29/16 for 2 to 3 days of persistent left leg weakness.  He did not have other associated symptoms, such as other extremity weakness, numbness, visual disturbance, unsteady gait or worsening of residual dysarthria.  Serum glucose was 263.  CT of head was personally reviewed and was negative for acute findings.  Unable to obtain MRI due to metal penile implant, so repeat head the CT on the following day was also negative.  Carotid doppler was negative for hemodynamically significant ICA stenosis.  LDL was 50.  Hgb A1c was elevated at 8.7.  Weakness was thought to be due to hypotension during HD.  He was advised to stop Lisinopril but otherwise continue is current regimen with dual antiplatelet therapy.  No recent seizures.  HISTORY: Around October 2015, he developed slurred speech.  He also experienced ringing in his right ear, with sensation of water in his right ear.  He did not seek medical attention at the time.  There was no facial droop or difficulty walking or swallowing.  He decided to go to the ED on 09/21/14 for an evaluation as the slurred speech and tinnitus persisted.  He denied weakness but ED exam revealed right arm weakness.  CT of the head was performed, which revealed chronic small vessel ischemic changes, including remote right cerebellar, left pontine and right basal ganglia lacunar infarcts.  No acute infarction was seen.     On 04/25/15, he developed right hand cramping and dystonia.  When EMS arrived, he had jerking of the right hand  that then generalized to a tonic-clonic seizure.  He had two other seizures, in the ambulance and in the ED, lasting 30-40 seconds.  CT of the head showed chronic small vessel disease and moderate brain atrophy.  He was started on Keppra 500mg  twice daily.    He was admitted to [facility] on 04/29/15 for seizure, which was thought to be secondary to hyperglycemia with a blood glucose level of 662.  MRI of the brain was withheld because he had a penile implant that contains metal.  Keppra was increased to 1000mg  daily with 500mg  following each dialysis.   He was re-admitted to [facility] again on 07/11/15 for presumed acute right pontine infarct.  He presented with diplopia and unsteady gait.  On exam, he was found to have one and a half syndrome.  CT of head and repeat CT of head did not reveal acute infarct. 2D echo showed EF 45-50% with grade 1 diastolic dysfunction.  Carotid doppler showed no hemodynamically significant ICA stenosis.   PAST MEDICAL HISTORY: Past Medical History:  Diagnosis Date  . Abnormal stress test    s/p cath November 2013 with modest disease involving the ostial left main, proximal LAD, proximal RCA - do not appear to be hemodynamically signficant; mild LV dysfunction  . Bacteremia   . Chronic systolic CHF (congestive heart failure) ([place])   . COPD (chronic obstructive pulmonary disease) ([place])   . Coronary artery disease    per cath report 2013  . Ejection fraction <  50%   . Erectile dysfunction    penile implant  . ESRD (end stage renal disease) ([place])    Started HD in [place] in 2009, ESRD was due to DM. Moved to [facility] in Dec 2009 and now gets dialysis at [facility] on a MWF schedule.     . Gout   . Gout    Hx: of  . Hepatitis C   . Hyperlipidemia   . Hypertension   . Obesity   . Retinopathy   . Shortness of breath    Hx: of with exertion  . Stroke ([place])   . Type II or unspecified type diabetes mellitus without mention of complication, not stated as uncontrolled      adult onset    MEDICATIONS: Current Outpatient Prescriptions on File Prior to Visit  Medication Sig Dispense Refill  . ACCU-CHEK FASTCLIX LANCETS MISC Check blood sugar 4 times a day before meals and bedtime 102 each 11  . allopurinol (ZYLOPRIM) 100 MG tablet Take 1 tablet (100 mg total) by mouth daily. 30 tablet 2  . amitriptyline (ELAVIL) 25 MG tablet TAKE ONE TABLET BY MOUTH AT BEDTIME FOR  NEUROPATHY 90 tablet 1  . aspirin EC 81 MG tablet Take 1 tablet (81 mg total) by mouth daily. 30 tablet 11  . atorvastatin (LIPITOR) 40 MG tablet Take 1 tablet (40 mg total) by mouth daily. 30 tablet 11  . CALCIUM PO Take 1 tablet by mouth daily.    . cinacalcet (SENSIPAR) 60 MG tablet Take 1 tablet (60 mg total) by mouth 2 (two) times daily. 60 tablet 5  . clopidogrel (PLAVIX) 75 MG tablet Take 1 tablet (75 mg total) by mouth daily. 30 tablet 11  . colchicine 0.6 MG tablet 1 per day on Monday and Friday 30 tablet 2  . glucose blood (ACCU-CHEK AVIVA PLUS) test strip Check blood sugar 4 times a day before meals and bedtime 125 each 11  . insulin glargine (LANTUS) 100 UNIT/ML injection Inject 0.1 mLs (10 Units total) into the skin at bedtime. 10 mL 11  . insulin lispro (HUMALOG) 100 UNIT/ML KiwkPen Inject 0.03 mLs (3 Units total) into the skin 3 (three) times daily with meals. 15 mL 5  . Insulin Pen Needle 32G X 5 MM MISC Please use 1 pen needle for each injection 100 each 12  . levETIRAcetam (KEPPRA) 500 MG tablet Take 2 tablets (1,000 mg total) by mouth daily. Then take extra 500 mg (1 tablet) on MWF after HD 72 tablet 5  . metoprolol tartrate (LOPRESSOR) 25 MG tablet Take 1 tablet (25 mg total) by mouth 2 (two) times daily. (Patient taking differently: Take 50 mg by mouth daily. ) 60 tablet 5  . multivitamin (RENA-VIT) TABS tablet Take 1 tablet by mouth daily. 30 tablet 11  . sevelamer carbonate (RENVELA) 800 MG tablet Take 1 tablet (800 mg total) by mouth 3 (three) times daily with meals. 90 tablet 1    No current facility-administered medications on file prior to visit.     ALLERGIES: [name]  FAMILY HISTORY: Family History  Problem Relation Age of Onset  . Heart disease Father   . Diabetes Sister   . Diabetes Brother   . Diabetes Son    .  SOCIAL HISTORY: Social History   Social History  . Marital status: Married    Spouse name: N/A  . Number of children: N/A  . Years of education: N/A   Occupational History  . foundry  worker Disabled    disabled   Social History Main Topics  . Smoking status: Former Smoker    Years: 7.00    Quit date: 06/10/1973  . Smokeless tobacco: Never Used     Comment: Quit in 1974.  [name] Alcohol use No  . Drug use: No  . Sexual activity: Not on file   Other Topics Concern  . Not on file   Social History Narrative   Adopted mentally retarded child at home, 2 adopted children, 3 living and 2 deceased.    REVIEW OF SYSTEMS: Constitutional: No fevers, chills, or sweats, no generalized fatigue, change in appetite Eyes: No visual changes, double vision, eye pain Ear, nose and throat: No hearing loss, ear pain, nasal congestion, sore throat Cardiovascular: No chest pain, palpitations Respiratory:  No shortness of breath at rest or with exertion, wheezes GastrointestinaI: No nausea, vomiting, diarrhea, abdominal pain, fecal incontinence Genitourinary:  No dysuria, urinary retention or frequency Musculoskeletal:  No neck pain, back pain Integumentary: No rash, pruritus, skin lesions Neurological: as above Psychiatric: No depression, insomnia, anxiety Endocrine: No palpitations, fatigue, diaphoresis, mood swings, change in appetite, change in weight, increased thirst Hematologic/Lymphatic:  No purpura, petechiae. Allergic/Immunologic: no itchy/runny eyes, nasal congestion, recent allergic reactions, rashes  PHYSICAL EXAM: Vitals:   11/12/16 1413  BP: (!) 150/78  Pulse: 73   General: No acute distress.  Patient appears  well-groomed.  Head:  Normocephalic/atraumatic Eyes:  Fundi examined but not visualized Neck: supple, no paraspinal tenderness, full range of motion Heart:  Regular rate and rhythm Lungs:  Clear to auscultation bilaterally Back: No paraspinal tenderness Neurological Exam: alert and oriented to person, place, and time. Attention span and concentration intact, recent and remote memory intact, fund of knowledge intact.  Speech fluent and not dysarthric, language intact.  CN II-XII intact. Bulk and tone normal, muscle strength 5/5 throughout.  Sensation to light touch  intact.  Deep tendon reflexes absent throughout.  Finger to nose testing intact.  Gait wide-based  IMPRESSION: CVA Symptomatic localization related seizure with secondary generalization Uncontrolled type 2 diabetes Hyperlipidemia  PLAN: ASA and Plavix for cardiac and stroke prevention Statin (LDL at goal of less than 70) Optimize glycemic control with Hgb A1c goal less than 6.5 Optimize blood pressure control (follow up BP with PCP) Mediterranean diet Keppra 1000mg  daily and 500mg  following dialysis Follow up in 7 months  Metta Clines, DO  CC: Juliet Rude, MD

## 2016-11-13 DIAGNOSIS — I5022 Chronic systolic (congestive) heart failure: Secondary | ICD-10-CM | POA: Diagnosis not present

## 2016-11-13 DIAGNOSIS — I69351 Hemiplegia and hemiparesis following cerebral infarction affecting right dominant side: Secondary | ICD-10-CM | POA: Diagnosis not present

## 2016-11-13 DIAGNOSIS — E1122 Type 2 diabetes mellitus with diabetic chronic kidney disease: Secondary | ICD-10-CM | POA: Diagnosis not present

## 2016-11-13 DIAGNOSIS — I2510 Atherosclerotic heart disease of native coronary artery without angina pectoris: Secondary | ICD-10-CM | POA: Diagnosis not present

## 2016-11-13 DIAGNOSIS — N186 End stage renal disease: Secondary | ICD-10-CM | POA: Diagnosis not present

## 2016-11-13 DIAGNOSIS — I132 Hypertensive heart and chronic kidney disease with heart failure and with stage 5 chronic kidney disease, or end stage renal disease: Secondary | ICD-10-CM | POA: Diagnosis not present

## 2016-11-14 DIAGNOSIS — N186 End stage renal disease: Secondary | ICD-10-CM | POA: Diagnosis not present

## 2016-11-14 DIAGNOSIS — Z992 Dependence on renal dialysis: Secondary | ICD-10-CM | POA: Diagnosis not present

## 2016-11-14 DIAGNOSIS — N2581 Secondary hyperparathyroidism of renal origin: Secondary | ICD-10-CM | POA: Diagnosis not present

## 2016-11-14 DIAGNOSIS — D509 Iron deficiency anemia, unspecified: Secondary | ICD-10-CM | POA: Diagnosis not present

## 2016-11-14 DIAGNOSIS — E1129 Type 2 diabetes mellitus with other diabetic kidney complication: Secondary | ICD-10-CM | POA: Diagnosis not present

## 2016-11-14 DIAGNOSIS — D631 Anemia in chronic kidney disease: Secondary | ICD-10-CM | POA: Diagnosis not present

## 2016-11-14 DIAGNOSIS — E119 Type 2 diabetes mellitus without complications: Secondary | ICD-10-CM | POA: Diagnosis not present

## 2016-11-15 DIAGNOSIS — N186 End stage renal disease: Secondary | ICD-10-CM | POA: Diagnosis not present

## 2016-11-15 DIAGNOSIS — I69351 Hemiplegia and hemiparesis following cerebral infarction affecting right dominant side: Secondary | ICD-10-CM | POA: Diagnosis not present

## 2016-11-15 DIAGNOSIS — I2510 Atherosclerotic heart disease of native coronary artery without angina pectoris: Secondary | ICD-10-CM | POA: Diagnosis not present

## 2016-11-15 DIAGNOSIS — I132 Hypertensive heart and chronic kidney disease with heart failure and with stage 5 chronic kidney disease, or end stage renal disease: Secondary | ICD-10-CM | POA: Diagnosis not present

## 2016-11-15 DIAGNOSIS — E1122 Type 2 diabetes mellitus with diabetic chronic kidney disease: Secondary | ICD-10-CM | POA: Diagnosis not present

## 2016-11-15 DIAGNOSIS — I5022 Chronic systolic (congestive) heart failure: Secondary | ICD-10-CM | POA: Diagnosis not present

## 2016-11-16 DIAGNOSIS — E119 Type 2 diabetes mellitus without complications: Secondary | ICD-10-CM | POA: Diagnosis not present

## 2016-11-16 DIAGNOSIS — N2581 Secondary hyperparathyroidism of renal origin: Secondary | ICD-10-CM | POA: Diagnosis not present

## 2016-11-16 DIAGNOSIS — D631 Anemia in chronic kidney disease: Secondary | ICD-10-CM | POA: Diagnosis not present

## 2016-11-16 DIAGNOSIS — D509 Iron deficiency anemia, unspecified: Secondary | ICD-10-CM | POA: Diagnosis not present

## 2016-11-16 DIAGNOSIS — N186 End stage renal disease: Secondary | ICD-10-CM | POA: Diagnosis not present

## 2016-11-19 DIAGNOSIS — N186 End stage renal disease: Secondary | ICD-10-CM | POA: Diagnosis not present

## 2016-11-19 DIAGNOSIS — N2581 Secondary hyperparathyroidism of renal origin: Secondary | ICD-10-CM | POA: Diagnosis not present

## 2016-11-19 DIAGNOSIS — E119 Type 2 diabetes mellitus without complications: Secondary | ICD-10-CM | POA: Diagnosis not present

## 2016-11-19 DIAGNOSIS — D631 Anemia in chronic kidney disease: Secondary | ICD-10-CM | POA: Diagnosis not present

## 2016-11-19 DIAGNOSIS — D509 Iron deficiency anemia, unspecified: Secondary | ICD-10-CM | POA: Diagnosis not present

## 2016-11-20 DIAGNOSIS — I2510 Atherosclerotic heart disease of native coronary artery without angina pectoris: Secondary | ICD-10-CM | POA: Diagnosis not present

## 2016-11-20 DIAGNOSIS — E1122 Type 2 diabetes mellitus with diabetic chronic kidney disease: Secondary | ICD-10-CM | POA: Diagnosis not present

## 2016-11-20 DIAGNOSIS — N186 End stage renal disease: Secondary | ICD-10-CM | POA: Diagnosis not present

## 2016-11-20 DIAGNOSIS — I132 Hypertensive heart and chronic kidney disease with heart failure and with stage 5 chronic kidney disease, or end stage renal disease: Secondary | ICD-10-CM | POA: Diagnosis not present

## 2016-11-20 DIAGNOSIS — I69351 Hemiplegia and hemiparesis following cerebral infarction affecting right dominant side: Secondary | ICD-10-CM | POA: Diagnosis not present

## 2016-11-20 DIAGNOSIS — I5022 Chronic systolic (congestive) heart failure: Secondary | ICD-10-CM | POA: Diagnosis not present

## 2016-11-21 DIAGNOSIS — D509 Iron deficiency anemia, unspecified: Secondary | ICD-10-CM | POA: Diagnosis not present

## 2016-11-21 DIAGNOSIS — E119 Type 2 diabetes mellitus without complications: Secondary | ICD-10-CM | POA: Diagnosis not present

## 2016-11-21 DIAGNOSIS — N186 End stage renal disease: Secondary | ICD-10-CM | POA: Diagnosis not present

## 2016-11-21 DIAGNOSIS — D631 Anemia in chronic kidney disease: Secondary | ICD-10-CM | POA: Diagnosis not present

## 2016-11-21 DIAGNOSIS — N2581 Secondary hyperparathyroidism of renal origin: Secondary | ICD-10-CM | POA: Diagnosis not present

## 2016-11-22 DIAGNOSIS — I2510 Atherosclerotic heart disease of native coronary artery without angina pectoris: Secondary | ICD-10-CM | POA: Diagnosis not present

## 2016-11-22 DIAGNOSIS — E1122 Type 2 diabetes mellitus with diabetic chronic kidney disease: Secondary | ICD-10-CM | POA: Diagnosis not present

## 2016-11-22 DIAGNOSIS — I5022 Chronic systolic (congestive) heart failure: Secondary | ICD-10-CM | POA: Diagnosis not present

## 2016-11-22 DIAGNOSIS — N186 End stage renal disease: Secondary | ICD-10-CM | POA: Diagnosis not present

## 2016-11-22 DIAGNOSIS — I132 Hypertensive heart and chronic kidney disease with heart failure and with stage 5 chronic kidney disease, or end stage renal disease: Secondary | ICD-10-CM | POA: Diagnosis not present

## 2016-11-22 DIAGNOSIS — I69351 Hemiplegia and hemiparesis following cerebral infarction affecting right dominant side: Secondary | ICD-10-CM | POA: Diagnosis not present

## 2016-11-23 DIAGNOSIS — N186 End stage renal disease: Secondary | ICD-10-CM | POA: Diagnosis not present

## 2016-11-23 DIAGNOSIS — D509 Iron deficiency anemia, unspecified: Secondary | ICD-10-CM | POA: Diagnosis not present

## 2016-11-23 DIAGNOSIS — D631 Anemia in chronic kidney disease: Secondary | ICD-10-CM | POA: Diagnosis not present

## 2016-11-23 DIAGNOSIS — E119 Type 2 diabetes mellitus without complications: Secondary | ICD-10-CM | POA: Diagnosis not present

## 2016-11-23 DIAGNOSIS — N2581 Secondary hyperparathyroidism of renal origin: Secondary | ICD-10-CM | POA: Diagnosis not present

## 2016-11-26 DIAGNOSIS — N186 End stage renal disease: Secondary | ICD-10-CM | POA: Diagnosis not present

## 2016-11-26 DIAGNOSIS — D509 Iron deficiency anemia, unspecified: Secondary | ICD-10-CM | POA: Diagnosis not present

## 2016-11-26 DIAGNOSIS — N2581 Secondary hyperparathyroidism of renal origin: Secondary | ICD-10-CM | POA: Diagnosis not present

## 2016-11-26 DIAGNOSIS — D631 Anemia in chronic kidney disease: Secondary | ICD-10-CM | POA: Diagnosis not present

## 2016-11-26 DIAGNOSIS — E119 Type 2 diabetes mellitus without complications: Secondary | ICD-10-CM | POA: Diagnosis not present

## 2016-11-27 DIAGNOSIS — E1122 Type 2 diabetes mellitus with diabetic chronic kidney disease: Secondary | ICD-10-CM | POA: Diagnosis not present

## 2016-11-27 DIAGNOSIS — I132 Hypertensive heart and chronic kidney disease with heart failure and with stage 5 chronic kidney disease, or end stage renal disease: Secondary | ICD-10-CM | POA: Diagnosis not present

## 2016-11-27 DIAGNOSIS — I69351 Hemiplegia and hemiparesis following cerebral infarction affecting right dominant side: Secondary | ICD-10-CM | POA: Diagnosis not present

## 2016-11-27 DIAGNOSIS — N186 End stage renal disease: Secondary | ICD-10-CM | POA: Diagnosis not present

## 2016-11-27 DIAGNOSIS — I5022 Chronic systolic (congestive) heart failure: Secondary | ICD-10-CM | POA: Diagnosis not present

## 2016-11-27 DIAGNOSIS — I2510 Atherosclerotic heart disease of native coronary artery without angina pectoris: Secondary | ICD-10-CM | POA: Diagnosis not present

## 2016-11-28 DIAGNOSIS — N186 End stage renal disease: Secondary | ICD-10-CM | POA: Diagnosis not present

## 2016-11-28 DIAGNOSIS — D509 Iron deficiency anemia, unspecified: Secondary | ICD-10-CM | POA: Diagnosis not present

## 2016-11-28 DIAGNOSIS — E119 Type 2 diabetes mellitus without complications: Secondary | ICD-10-CM | POA: Diagnosis not present

## 2016-11-28 DIAGNOSIS — D631 Anemia in chronic kidney disease: Secondary | ICD-10-CM | POA: Diagnosis not present

## 2016-11-28 DIAGNOSIS — N2581 Secondary hyperparathyroidism of renal origin: Secondary | ICD-10-CM | POA: Diagnosis not present

## 2016-11-29 DIAGNOSIS — I132 Hypertensive heart and chronic kidney disease with heart failure and with stage 5 chronic kidney disease, or end stage renal disease: Secondary | ICD-10-CM | POA: Diagnosis not present

## 2016-11-29 DIAGNOSIS — I5022 Chronic systolic (congestive) heart failure: Secondary | ICD-10-CM | POA: Diagnosis not present

## 2016-11-29 DIAGNOSIS — E1122 Type 2 diabetes mellitus with diabetic chronic kidney disease: Secondary | ICD-10-CM | POA: Diagnosis not present

## 2016-11-29 DIAGNOSIS — I2510 Atherosclerotic heart disease of native coronary artery without angina pectoris: Secondary | ICD-10-CM | POA: Diagnosis not present

## 2016-11-29 DIAGNOSIS — I69351 Hemiplegia and hemiparesis following cerebral infarction affecting right dominant side: Secondary | ICD-10-CM | POA: Diagnosis not present

## 2016-11-29 DIAGNOSIS — N186 End stage renal disease: Secondary | ICD-10-CM | POA: Diagnosis not present

## 2016-11-30 DIAGNOSIS — N2581 Secondary hyperparathyroidism of renal origin: Secondary | ICD-10-CM | POA: Diagnosis not present

## 2016-11-30 DIAGNOSIS — D631 Anemia in chronic kidney disease: Secondary | ICD-10-CM | POA: Diagnosis not present

## 2016-11-30 DIAGNOSIS — E119 Type 2 diabetes mellitus without complications: Secondary | ICD-10-CM | POA: Diagnosis not present

## 2016-11-30 DIAGNOSIS — D509 Iron deficiency anemia, unspecified: Secondary | ICD-10-CM | POA: Diagnosis not present

## 2016-11-30 DIAGNOSIS — N186 End stage renal disease: Secondary | ICD-10-CM | POA: Diagnosis not present

## 2016-12-03 DIAGNOSIS — E119 Type 2 diabetes mellitus without complications: Secondary | ICD-10-CM | POA: Diagnosis not present

## 2016-12-03 DIAGNOSIS — N2581 Secondary hyperparathyroidism of renal origin: Secondary | ICD-10-CM | POA: Diagnosis not present

## 2016-12-03 DIAGNOSIS — N186 End stage renal disease: Secondary | ICD-10-CM | POA: Diagnosis not present

## 2016-12-03 DIAGNOSIS — D631 Anemia in chronic kidney disease: Secondary | ICD-10-CM | POA: Diagnosis not present

## 2016-12-03 DIAGNOSIS — D509 Iron deficiency anemia, unspecified: Secondary | ICD-10-CM | POA: Diagnosis not present

## 2016-12-04 DIAGNOSIS — I5022 Chronic systolic (congestive) heart failure: Secondary | ICD-10-CM | POA: Diagnosis not present

## 2016-12-04 DIAGNOSIS — I69351 Hemiplegia and hemiparesis following cerebral infarction affecting right dominant side: Secondary | ICD-10-CM | POA: Diagnosis not present

## 2016-12-04 DIAGNOSIS — I132 Hypertensive heart and chronic kidney disease with heart failure and with stage 5 chronic kidney disease, or end stage renal disease: Secondary | ICD-10-CM | POA: Diagnosis not present

## 2016-12-04 DIAGNOSIS — E1122 Type 2 diabetes mellitus with diabetic chronic kidney disease: Secondary | ICD-10-CM | POA: Diagnosis not present

## 2016-12-04 DIAGNOSIS — N186 End stage renal disease: Secondary | ICD-10-CM | POA: Diagnosis not present

## 2016-12-04 DIAGNOSIS — I2510 Atherosclerotic heart disease of native coronary artery without angina pectoris: Secondary | ICD-10-CM | POA: Diagnosis not present

## 2016-12-05 DIAGNOSIS — E119 Type 2 diabetes mellitus without complications: Secondary | ICD-10-CM | POA: Diagnosis not present

## 2016-12-05 DIAGNOSIS — D509 Iron deficiency anemia, unspecified: Secondary | ICD-10-CM | POA: Diagnosis not present

## 2016-12-05 DIAGNOSIS — N2581 Secondary hyperparathyroidism of renal origin: Secondary | ICD-10-CM | POA: Diagnosis not present

## 2016-12-05 DIAGNOSIS — N186 End stage renal disease: Secondary | ICD-10-CM | POA: Diagnosis not present

## 2016-12-05 DIAGNOSIS — D631 Anemia in chronic kidney disease: Secondary | ICD-10-CM | POA: Diagnosis not present

## 2016-12-07 DIAGNOSIS — D631 Anemia in chronic kidney disease: Secondary | ICD-10-CM | POA: Diagnosis not present

## 2016-12-07 DIAGNOSIS — D509 Iron deficiency anemia, unspecified: Secondary | ICD-10-CM | POA: Diagnosis not present

## 2016-12-07 DIAGNOSIS — N2581 Secondary hyperparathyroidism of renal origin: Secondary | ICD-10-CM | POA: Diagnosis not present

## 2016-12-07 DIAGNOSIS — N186 End stage renal disease: Secondary | ICD-10-CM | POA: Diagnosis not present

## 2016-12-07 DIAGNOSIS — E119 Type 2 diabetes mellitus without complications: Secondary | ICD-10-CM | POA: Diagnosis not present

## 2016-12-10 DIAGNOSIS — E119 Type 2 diabetes mellitus without complications: Secondary | ICD-10-CM | POA: Diagnosis not present

## 2016-12-10 DIAGNOSIS — N2581 Secondary hyperparathyroidism of renal origin: Secondary | ICD-10-CM | POA: Diagnosis not present

## 2016-12-10 DIAGNOSIS — D631 Anemia in chronic kidney disease: Secondary | ICD-10-CM | POA: Diagnosis not present

## 2016-12-10 DIAGNOSIS — D509 Iron deficiency anemia, unspecified: Secondary | ICD-10-CM | POA: Diagnosis not present

## 2016-12-10 DIAGNOSIS — N186 End stage renal disease: Secondary | ICD-10-CM | POA: Diagnosis not present

## 2016-12-12 DIAGNOSIS — D631 Anemia in chronic kidney disease: Secondary | ICD-10-CM | POA: Diagnosis not present

## 2016-12-12 DIAGNOSIS — Z992 Dependence on renal dialysis: Secondary | ICD-10-CM | POA: Diagnosis not present

## 2016-12-12 DIAGNOSIS — D509 Iron deficiency anemia, unspecified: Secondary | ICD-10-CM | POA: Diagnosis not present

## 2016-12-12 DIAGNOSIS — E119 Type 2 diabetes mellitus without complications: Secondary | ICD-10-CM | POA: Diagnosis not present

## 2016-12-12 DIAGNOSIS — N2581 Secondary hyperparathyroidism of renal origin: Secondary | ICD-10-CM | POA: Diagnosis not present

## 2016-12-12 DIAGNOSIS — N186 End stage renal disease: Secondary | ICD-10-CM | POA: Diagnosis not present

## 2016-12-12 DIAGNOSIS — E1129 Type 2 diabetes mellitus with other diabetic kidney complication: Secondary | ICD-10-CM | POA: Diagnosis not present

## 2016-12-13 DIAGNOSIS — D631 Anemia in chronic kidney disease: Secondary | ICD-10-CM | POA: Diagnosis not present

## 2016-12-13 DIAGNOSIS — D509 Iron deficiency anemia, unspecified: Secondary | ICD-10-CM | POA: Diagnosis not present

## 2016-12-13 DIAGNOSIS — E119 Type 2 diabetes mellitus without complications: Secondary | ICD-10-CM | POA: Diagnosis not present

## 2016-12-13 DIAGNOSIS — N186 End stage renal disease: Secondary | ICD-10-CM | POA: Diagnosis not present

## 2016-12-13 DIAGNOSIS — N2581 Secondary hyperparathyroidism of renal origin: Secondary | ICD-10-CM | POA: Diagnosis not present

## 2016-12-14 DIAGNOSIS — N186 End stage renal disease: Secondary | ICD-10-CM | POA: Diagnosis not present

## 2016-12-14 DIAGNOSIS — E119 Type 2 diabetes mellitus without complications: Secondary | ICD-10-CM | POA: Diagnosis not present

## 2016-12-14 DIAGNOSIS — D631 Anemia in chronic kidney disease: Secondary | ICD-10-CM | POA: Diagnosis not present

## 2016-12-14 DIAGNOSIS — D509 Iron deficiency anemia, unspecified: Secondary | ICD-10-CM | POA: Diagnosis not present

## 2016-12-14 DIAGNOSIS — N2581 Secondary hyperparathyroidism of renal origin: Secondary | ICD-10-CM | POA: Diagnosis not present

## 2016-12-15 ENCOUNTER — Emergency Department (HOSPITAL_COMMUNITY)
Admission: EM | Admit: 2016-12-15 | Discharge: 2016-12-15 | Disposition: A | Discharge status: Home / Self Care | Payer: Medicare Other | Attending: Emergency Medicine | Admitting: Emergency Medicine

## 2016-12-15 ENCOUNTER — Emergency Department (HOSPITAL_COMMUNITY): Payer: Medicare Other

## 2016-12-15 DIAGNOSIS — J810 Acute pulmonary edema: Secondary | ICD-10-CM | POA: Diagnosis not present

## 2016-12-15 DIAGNOSIS — I132 Hypertensive heart and chronic kidney disease with heart failure and with stage 5 chronic kidney disease, or end stage renal disease: Secondary | ICD-10-CM | POA: Insufficient documentation

## 2016-12-15 DIAGNOSIS — N186 End stage renal disease: Secondary | ICD-10-CM | POA: Insufficient documentation

## 2016-12-15 DIAGNOSIS — E1122 Type 2 diabetes mellitus with diabetic chronic kidney disease: Secondary | ICD-10-CM | POA: Diagnosis not present

## 2016-12-15 DIAGNOSIS — I5042 Chronic combined systolic (congestive) and diastolic (congestive) heart failure: Secondary | ICD-10-CM | POA: Diagnosis not present

## 2016-12-15 DIAGNOSIS — Z8673 Personal history of transient ischemic attack (TIA), and cerebral infarction without residual deficits: Secondary | ICD-10-CM | POA: Diagnosis not present

## 2016-12-15 DIAGNOSIS — Z992 Dependence on renal dialysis: Secondary | ICD-10-CM | POA: Diagnosis not present

## 2016-12-15 DIAGNOSIS — J449 Chronic obstructive pulmonary disease, unspecified: Secondary | ICD-10-CM | POA: Diagnosis not present

## 2016-12-15 DIAGNOSIS — Z79899 Other long term (current) drug therapy: Secondary | ICD-10-CM | POA: Diagnosis not present

## 2016-12-15 DIAGNOSIS — Z794 Long term (current) use of insulin: Secondary | ICD-10-CM | POA: Insufficient documentation

## 2016-12-15 DIAGNOSIS — I120 Hypertensive chronic kidney disease with stage 5 chronic kidney disease or end stage renal disease: Secondary | ICD-10-CM | POA: Diagnosis not present

## 2016-12-15 DIAGNOSIS — R0602 Shortness of breath: Secondary | ICD-10-CM | POA: Diagnosis not present

## 2016-12-15 DIAGNOSIS — E875 Hyperkalemia: Secondary | ICD-10-CM | POA: Insufficient documentation

## 2016-12-15 DIAGNOSIS — Z87891 Personal history of nicotine dependence: Secondary | ICD-10-CM | POA: Diagnosis not present

## 2016-12-15 DIAGNOSIS — Z7982 Long term (current) use of aspirin: Secondary | ICD-10-CM | POA: Insufficient documentation

## 2016-12-15 LAB — COMPREHENSIVE METABOLIC PANEL
ALBUMIN: 3.7 g/dL (ref 3.5–5.0)
ALT: 13 U/L — AB (ref 17–63)
AST: 17 U/L (ref 15–41)
Alkaline Phosphatase: 46 U/L (ref 38–126)
Anion gap: 15 (ref 5–15)
BILIRUBIN TOTAL: 0.7 mg/dL (ref 0.3–1.2)
BUN: 53 mg/dL — AB (ref 6–20)
CHLORIDE: 95 mmol/L — AB (ref 101–111)
CO2: 30 mmol/L (ref 22–32)
CREATININE: 9.54 mg/dL — AB (ref 0.61–1.24)
Calcium: 9.2 mg/dL (ref 8.9–10.3)
GFR calc Af Amer: 6 mL/min — ABNORMAL LOW (ref >60)
GFR, EST NON AFRICAN AMERICAN: 5 mL/min — AB (ref >60)
GLUCOSE: 106 mg/dL — AB (ref 65–99)
Potassium: 6.2 mmol/L — ABNORMAL HIGH (ref 3.5–5.1)
Sodium: 140 mmol/L (ref 135–145)
Total Protein: 6.7 g/dL (ref 6.5–8.1)

## 2016-12-15 LAB — CBC WITH DIFFERENTIAL/PLATELET
BASOS ABS: 0 10*3/uL (ref 0.0–0.1)
Basophils Relative: 0 %
EOS PCT: 2 %
Eosinophils Absolute: 0.2 10*3/uL (ref 0.0–0.7)
HEMATOCRIT: 34.6 % — AB (ref 39.0–52.0)
Hemoglobin: 11.6 g/dL — ABNORMAL LOW (ref 13.0–17.0)
Lymphocytes Relative: 24 %
Lymphs Abs: 2.2 10*3/uL (ref 0.7–4.0)
MCH: 30.4 pg (ref 26.0–34.0)
MCHC: 33.5 g/dL (ref 30.0–36.0)
MCV: 90.6 fL (ref 78.0–100.0)
Monocytes Absolute: 1 10*3/uL (ref 0.1–1.0)
Monocytes Relative: 11 %
NEUTROS PCT: 63 %
Neutro Abs: 5.8 10*3/uL (ref 1.7–7.7)
PLATELETS: 166 10*3/uL (ref 150–400)
RBC: 3.82 MIL/uL — AB (ref 4.22–5.81)
RDW: 14 % (ref 11.5–15.5)
WBC: 9.2 10*3/uL (ref 4.0–10.5)

## 2016-12-15 LAB — I-STAT TROPONIN, ED: Troponin i, poc: 0.17 ng/mL (ref 0.00–0.08)

## 2016-12-15 LAB — BRAIN NATRIURETIC PEPTIDE: B NATRIURETIC PEPTIDE 5: 1298 pg/mL — AB (ref 0.0–100.0)

## 2016-12-15 MED ORDER — INSULIN ASPART 100 UNIT/ML ~~LOC~~ SOLN
5.0000 [IU] | Freq: Once | SUBCUTANEOUS | Status: DC
Start: 2016-12-15 — End: 2016-12-15

## 2016-12-15 MED ORDER — ALBUTEROL SULFATE (2.5 MG/3ML) 0.083% IN NEBU: 5.0000 mg | INHALATION_SOLUTION | Freq: Once | RESPIRATORY_TRACT | Status: DC

## 2016-12-15 MED ORDER — CALCIUM GLUCONATE 10 % IV SOLN
1.0000 g | Freq: Once | INTRAVENOUS | Status: DC
  Filled 2016-12-15: qty 10

## 2016-12-15 MED ORDER — DEXTROSE 50 % IV SOLN: 1.0000 | Freq: Once | INTRAVENOUS | Status: DC

## 2016-12-15 MED ORDER — IPRATROPIUM-ALBUTEROL 0.5-2.5 (3) MG/3ML IN SOLN
3.0000 mL | Freq: Once | RESPIRATORY_TRACT | Status: AC
  Administered 2016-12-15: 3 mL via RESPIRATORY_TRACT
  Filled 2016-12-15: qty 3

## 2016-12-15 NOTE — Progress Notes (Signed)
Pt dc'd from hemodialysis to home with wife.

## 2016-12-15 NOTE — ED Triage Notes (Signed)
Pt. CO of SOB. Dialysis M/W/F, only received 2 hours yesterday. Pt. States that when it feels hard to breathe like this, he needs to be emergently dialyzed. Lungs are a little course bilaterally but overall clear and vitals WDL

## 2016-12-15 NOTE — ED Notes (Signed)
I-stat troponin result shown to Dr. Ellender Hose

## 2016-12-15 NOTE — ED Notes (Signed)
Attempted to start IV x 2, unsuccessful. 

## 2016-12-15 NOTE — ED Notes (Signed)
EDP notified of no IV access. Pt is ready for dialysis. Pt transported to dialysis by RN.

## 2016-12-15 NOTE — ED Notes (Signed)
Taken to imaging.

## 2016-12-15 NOTE — ED Provider Notes (Signed)
[place] DEPT Provider Note   CSN: [id number] Arrival date & time: 12/15/16  1015     History   Chief Complaint Chief Complaint  Patient presents with  . Shortness of Breath    HPI Ernest [name] is a 71 y.o. male.  HPI   71 year old male with extensive past medical history including end-stage renal disease on Monday Wednesday Friday at [facility] kidney who presents with shortness of breath. Patient states that he was late to his dialysis session yesterday and he sat for 2 out of his normal 4-1/2 hours. He states that last night, he began to feel progressively more short of breath. He laid flat to go to sleep and experienced acute worsening of the shortness of breath. He subsequently sat up and had mild improvement. However, throughout the night, he has developed progressively worsening shortness of breath. He is not short of breath at rest. Denies any associated chest pain. No nausea or vomiting. He endorses a mild, nonproductive cough. Denies any associated nausea, vomiting, or abdominal pain. He is unsure whether he is above or below his dry weight. He states he did otherwise complete dialysis sessions for the remainder of last week.  Past Medical History:  Diagnosis Date  . Abnormal stress test    s/p cath November 2013 with modest disease involving the ostial left main, proximal LAD, proximal RCA - do not appear to be hemodynamically signficant; mild LV dysfunction  . Bacteremia   . Chronic systolic CHF (congestive heart failure) ([place])   . COPD (chronic obstructive pulmonary disease) ([place])   . Coronary artery disease    per cath report 2013  . Ejection fraction < 50%   . Erectile dysfunction    penile implant  . ESRD (end stage renal disease) ([place])    Started HD in [place] in 2009, ESRD was due to DM. Moved to [facility] in Dec 2009 and now gets dialysis at [facility] on a MWF schedule.     . Gout   . Gout    Hx: of  . Hepatitis C   . Hyperlipidemia   . Hypertension    . Obesity   . Retinopathy   . Shortness of breath    Hx: of with exertion  . Stroke ([place])   . Type II or unspecified type diabetes mellitus without mention of complication, not stated as uncontrolled    adult onset    Patient Active Problem List   Diagnosis Date Noted  . Left leg weakness   . TIA (transient ischemic attack) 10/27/2016  . History of CVA with residual deficit 08/07/2015  . Seizure, late effect of stroke ([place]) 08/07/2015  . Diabetic peripheral neuropathy ([place]) 08/07/2015  . Obstructive sleep apnea 06/16/2013  . Hyperlipidemia associated with type 2 diabetes mellitus ([place])   . ESRD (end stage renal disease) ([place])   . Erectile dysfunction associated with type 2 diabetes mellitus ([place])   . Hypertension due to end stage renal disease caused by type 2 diabetes mellitus, on dialysis ([place])   . COPD (chronic obstructive pulmonary disease) ([place])   . Coronary artery disease   . Chronic combined systolic and diastolic CHF (congestive heart failure) ([place])   . Gout, unspecified 02/28/2009  . Uncontrolled type 2 diabetes mellitus with end-stage renal disease ([place]) 06/08/1984    Past Surgical History:  Procedure Laterality Date  . AV FISTULA PLACEMENT Left 01/13/2013   Procedure: INSERTION OF ARTERIOVENOUS (AV) GORE-TEX GRAFT ARM;  Surgeon: Angelia Mould, MD;  Location: [facility];  Service: Vascular;  Laterality: Left;  . AV FISTULA PLACEMENT Left 05/05/2013   Procedure: INSERTION OF LEFT UPPER ARM  ARTERIOVENOUS GORTEX GRAFT;  Surgeon: Angelia Mould, MD;  Location: [place];  Service: Vascular;  Laterality: Left;  . [place] REMOVAL Left 03/26/2013   Procedure: REMOVAL OF  NON INCORPORATED ARTERIOVENOUS GORETEX GRAFT ([place]) left arm * repair of  left brachial artery with vein patch angioplasty.;  Surgeon: Angelia Mould, MD;  Location: [place];  Service: Vascular;  Laterality: Left;  . CARDIAC CATHETERIZATION  August 26, 2012  . CATARACT EXTRACTION W/ INTRAOCULAR LENS   IMPLANT, BILATERAL    . COLONOSCOPY    . EMBOLECTOMY Right 08/27/2013   Procedure: EMBOLECTOMY;  Surgeon: Mal Misty, MD;  Location: [place];  Service: Vascular;  Laterality: Right;  Thrombectomy of Radial and ulnar artery.  [name] EYE SURGERY     laser.  and surgery for DM  . fistula     RUE and wrist  . INSERTION OF DIALYSIS CATHETER Right 01/01/2013   Procedure: INSERTION OF DIALYSIS CATHETER right  internal jugular;  Surgeon: Serafina Mitchell, MD;  Location: [place];  Service: Vascular;  Laterality: Right;  . INSERTION OF DIALYSIS CATHETER N/A 03/26/2013   Procedure: INSERTION OF DIALYSIS CATHETER Left internal jugular vein;  Surgeon: Angelia Mould, MD;  Location: [place];  Service: Vascular;  Laterality: N/A;  . LIGATION OF ARTERIOVENOUS  FISTULA Right 12/30/2013   Procedure: REMOVAL OF SEGMENT OF GORTEX GRAFT AND FISTULA  AND REPAIR OF BRACHIAL ARTERY;  Surgeon: Mal Misty, MD;  Location: [facility];  Service: Vascular;  Laterality: Right;  . PENILE PROSTHESIS IMPLANT     1997.. no card  . TEE WITHOUT CARDIOVERSION N/A 06/11/2013   Procedure: TRANSESOPHAGEAL ECHOCARDIOGRAM (TEE);  Surgeon: Larey Dresser, MD;  Location: [place];  Service: Cardiovascular;  Laterality: N/A;  . THROMBECTOMY W/ EMBOLECTOMY Left 03/26/2013   Procedure: Attempted thrombectomy of left arm arteriovenous goretex graft.;  Surgeon: Angelia Mould, MD;  Location: [place];  Service: Vascular;  Laterality: Left;  [name] VENOGRAM N/A 04/07/2013   Procedure: VENOGRAM;  Surgeon: Serafina Mitchell, MD;  Location: [facility] CATH LAB;  Service: Cardiovascular;  Laterality: N/A;       Home Medications    Prior to Admission medications   Medication Sig Start Date End Date Taking? Authorizing Provider  ACCU-CHEK FASTCLIX LANCETS MISC Check blood sugar 4 times a day before meals and bedtime 11/29/15   Juliet Rude, MD  allopurinol (ZYLOPRIM) 100 MG tablet Take 1 tablet (100 mg total) by mouth daily. 04/16/16   Carly Montey Hora, MD    amitriptyline (ELAVIL) 25 MG tablet TAKE ONE TABLET BY MOUTH AT BEDTIME FOR  NEUROPATHY 04/16/16   Juliet Rude, MD  aspirin EC 81 MG tablet Take 1 tablet (81 mg total) by mouth daily. 11/29/15   Carly Montey Hora, MD  atorvastatin (LIPITOR) 40 MG tablet Take 1 tablet (40 mg total) by mouth daily. 11/29/15   Juliet Rude, MD  CALCIUM PO Take 1 tablet by mouth daily.    Historical Provider, MD  cinacalcet (SENSIPAR) 60 MG tablet Take 1 tablet (60 mg total) by mouth 2 (two) times daily. 11/29/15   Juliet Rude, MD  clopidogrel (PLAVIX) 75 MG tablet Take 1 tablet (75 mg total) by mouth daily. 11/29/15   Juliet Rude, MD  colchicine 0.6 MG tablet 1 per day on Monday and Friday  04/16/16   Juliet Rude, MD  glucose blood (ACCU-CHEK AVIVA PLUS) test strip Check blood sugar 4 times a day before meals and bedtime 11/29/15   Carly J Rivet, MD  insulin glargine (LANTUS) 100 UNIT/ML injection Inject 0.1 mLs (10 Units total) into the skin at bedtime. 11/29/15   Carly Montey Hora, MD  insulin lispro (HUMALOG) 100 UNIT/ML KiwkPen Inject 0.03 mLs (3 Units total) into the skin 3 (three) times daily with meals. 11/29/15   Juliet Rude, MD  Insulin Pen Needle 32G X 5 MM MISC Please use 1 pen needle for each injection 11/29/15   Carly J Rivet, MD  levETIRAcetam (KEPPRA) 500 MG tablet Take 2 tablets (1,000 mg total) by mouth daily. Then take extra 500 mg (1 tablet) on MWF after HD 11/29/15   Juliet Rude, MD  metoprolol tartrate (LOPRESSOR) 25 MG tablet Take 1 tablet (25 mg total) by mouth 2 (two) times daily. Patient taking differently: Take 50 mg by mouth daily.  11/29/15   Juliet Rude, MD  multivitamin (RENA-VIT) TABS tablet Take 1 tablet by mouth daily. 11/29/15   Juliet Rude, MD  sevelamer carbonate (RENVELA) 800 MG tablet Take 1 tablet (800 mg total) by mouth 3 (three) times daily with meals. 11/29/15   Juliet Rude, MD    Family History Family History  Problem Relation Age of Onset  . Heart disease Father   . Diabetes  Sister   . Diabetes Brother   . Diabetes Son     Social History Social History  Substance Use Topics  . Smoking status: Former Smoker    Years: 7.00    Quit date: 06/10/1973  . Smokeless tobacco: Never Used     Comment: Quit in 1974.  [name] Alcohol use No     Allergies   Patient has no known allergies.   Review of Systems Review of Systems  Constitutional: Positive for fatigue. Negative for chills and fever.  HENT: Negative for congestion and rhinorrhea.   Eyes: Negative for visual disturbance.  Respiratory: Positive for cough and shortness of breath. Negative for wheezing.   Cardiovascular: Positive for leg swelling. Negative for chest pain.  Gastrointestinal: Negative for abdominal pain, diarrhea, nausea and vomiting.  Genitourinary: Negative for dysuria and flank pain.  Musculoskeletal: Negative for neck pain and neck stiffness.  Skin: Negative for rash and wound.  Allergic/Immunologic: Negative for immunocompromised state.  Neurological: Negative for syncope, weakness and headaches.  All other systems reviewed and are negative.    Physical Exam Updated Vital Signs There were no vitals taken for this visit.  Physical Exam  Constitutional: He is oriented to person, place, and time. He appears well-developed and well-nourished. No distress.  HENT:  Head: Normocephalic and atraumatic.  Eyes: Conjunctivae are normal.  Neck: Neck supple.  Cardiovascular: Normal rate, regular rhythm and normal heart sounds.  Exam reveals no friction rub.   No murmur heard. Pulmonary/Chest: Effort normal. No respiratory distress. He has no wheezes. He has rales.  Faint bibasilar rales with mild expiratory wheezing  Abdominal: He exhibits no distension.  Musculoskeletal: He exhibits edema (1+ pitting edema to bilateral lower extremities.).  Neurological: He is alert and oriented to person, place, and time. He exhibits normal muscle tone.  Skin: Skin is warm. Capillary refill takes less  than 2 seconds.  Left AV fistula with palpable thrill  Psychiatric: He has a normal mood and affect.  Nursing note and vitals reviewed.    ED Treatments /  Results  Labs (all labs ordered are listed, but only abnormal results are displayed) Labs Reviewed  CBC WITH DIFFERENTIAL/PLATELET  COMPREHENSIVE METABOLIC PANEL  BRAIN NATRIURETIC PEPTIDE  I-STAT [place], ED    EKG  EKG Interpretation None       Radiology No results found.  Procedures .Critical Care Performed by: Duffy Bruce Authorized by: Duffy Bruce   Critical care provider statement:    Critical care time (minutes):  35   Critical care was necessary to treat or prevent imminent or life-threatening deterioration of the following conditions:  Circulatory failure, metabolic crisis and respiratory failure   Critical care was time spent personally by me on the following activities:  Blood draw for specimens, discussions with consultants, evaluation of patient's response to treatment, discussions with primary provider, examination of patient, obtaining history from patient or surrogate, ordering and performing treatments and interventions, ordering and review of laboratory studies, ordering and review of radiographic studies, pulse oximetry, re-evaluation of patient's condition and review of old charts   I assumed direction of critical care for this patient from another provider in my specialty: no     (including critical care time)  Medications Ordered in ED Medications - No data to display   Initial Impression / Assessment and Plan / ED Course  I have reviewed the triage vital signs and the nursing notes.  Pertinent labs & imaging results that were available during my care of the patient were reviewed by me and considered in my medical decision making (see chart for details).    71 year old male with past medical history of end-stage renal disease and CHF here with short of breath orthopnea. I suspect  this is secondary to hypervolemia and pulmonary edema in the setting of a shortened dialysis session. Will obtain screening lab work. No fevers, sputum production, and I do not suspect infectious process such as pneumonia at this time. He has no chest pain and EKG is nonischemic. I do not suspect ACS.  Chest x-ray shows pulmonary edema. Lab work is consistent with needing dialysis with elevated BNP at 1300. Potassium is 6.2. His EKG shows no peaking of T waves and no widened QRS. Patient temporized with insulin and calcium. I discussed with nephrology who will take the patient for urgent dialysis. Otherwise, patient is at his baseline state of health. He has no leukocytosis or evidence of infection. I suspect he may likely be able to be discharged if his fluid status improves after dialysis and potassium corrects appropriately.  Final Clinical Impressions(s) / ED Diagnoses   Final diagnoses:  ESRD (end stage renal disease) ([place])  Acute pulmonary edema ([place])  Hyperkalemia      Duffy Bruce, MD 12/15/16 1616

## 2016-12-17 DIAGNOSIS — E119 Type 2 diabetes mellitus without complications: Secondary | ICD-10-CM | POA: Diagnosis not present

## 2016-12-17 DIAGNOSIS — N186 End stage renal disease: Secondary | ICD-10-CM | POA: Diagnosis not present

## 2016-12-17 DIAGNOSIS — N2581 Secondary hyperparathyroidism of renal origin: Secondary | ICD-10-CM | POA: Diagnosis not present

## 2016-12-17 DIAGNOSIS — D631 Anemia in chronic kidney disease: Secondary | ICD-10-CM | POA: Diagnosis not present

## 2016-12-17 DIAGNOSIS — D509 Iron deficiency anemia, unspecified: Secondary | ICD-10-CM | POA: Diagnosis not present

## 2016-12-18 DIAGNOSIS — L910 Hypertrophic scar: Secondary | ICD-10-CM | POA: Diagnosis not present

## 2016-12-19 DIAGNOSIS — N2581 Secondary hyperparathyroidism of renal origin: Secondary | ICD-10-CM | POA: Diagnosis not present

## 2016-12-19 DIAGNOSIS — D509 Iron deficiency anemia, unspecified: Secondary | ICD-10-CM | POA: Diagnosis not present

## 2016-12-19 DIAGNOSIS — E119 Type 2 diabetes mellitus without complications: Secondary | ICD-10-CM | POA: Diagnosis not present

## 2016-12-19 DIAGNOSIS — D631 Anemia in chronic kidney disease: Secondary | ICD-10-CM | POA: Diagnosis not present

## 2016-12-19 DIAGNOSIS — N186 End stage renal disease: Secondary | ICD-10-CM | POA: Diagnosis not present

## 2016-12-21 DIAGNOSIS — N2581 Secondary hyperparathyroidism of renal origin: Secondary | ICD-10-CM | POA: Diagnosis not present

## 2016-12-21 DIAGNOSIS — E119 Type 2 diabetes mellitus without complications: Secondary | ICD-10-CM | POA: Diagnosis not present

## 2016-12-21 DIAGNOSIS — D509 Iron deficiency anemia, unspecified: Secondary | ICD-10-CM | POA: Diagnosis not present

## 2016-12-21 DIAGNOSIS — D631 Anemia in chronic kidney disease: Secondary | ICD-10-CM | POA: Diagnosis not present

## 2016-12-21 DIAGNOSIS — N186 End stage renal disease: Secondary | ICD-10-CM | POA: Diagnosis not present

## 2016-12-24 DIAGNOSIS — N186 End stage renal disease: Secondary | ICD-10-CM | POA: Diagnosis not present

## 2016-12-24 DIAGNOSIS — N2581 Secondary hyperparathyroidism of renal origin: Secondary | ICD-10-CM | POA: Diagnosis not present

## 2016-12-24 DIAGNOSIS — D509 Iron deficiency anemia, unspecified: Secondary | ICD-10-CM | POA: Diagnosis not present

## 2016-12-24 DIAGNOSIS — E119 Type 2 diabetes mellitus without complications: Secondary | ICD-10-CM | POA: Diagnosis not present

## 2016-12-24 DIAGNOSIS — D631 Anemia in chronic kidney disease: Secondary | ICD-10-CM | POA: Diagnosis not present

## 2016-12-25 ENCOUNTER — Ambulatory Visit (INDEPENDENT_AMBULATORY_CARE_PROVIDER_SITE_OTHER): Payer: Medicare Other | Admitting: Podiatry

## 2016-12-25 ENCOUNTER — Encounter: Payer: Self-pay | Admitting: Podiatry

## 2016-12-25 DIAGNOSIS — Z794 Long term (current) use of insulin: Secondary | ICD-10-CM

## 2016-12-25 DIAGNOSIS — E119 Type 2 diabetes mellitus without complications: Secondary | ICD-10-CM

## 2016-12-25 DIAGNOSIS — B351 Tinea unguium: Secondary | ICD-10-CM

## 2016-12-25 NOTE — Progress Notes (Signed)
Patient ID: Ernest [name], male   DOB: 1946/09/26, 71 y.o.   MRN: [id number] Complaint:  Visit Type: Patient returns to my office for continued preventative foot care services. Complaint: Patient states" my nails have grown long and thick and become painful to walk and wear shoes" Patient has been diagnosed with DM with no foot  complications. He presents for preventative foot care services. No changes to ROS  Podiatric Exam: Vascular: dorsalis pedis and posterior tibial pulses are palpable bilateral. Capillary return is immediate. Temperature gradient is WNL. Skin turgor WNL  Sensorium: Diminished n Semmes Weinstein monofilament test. Normal tactile sensation bilaterally. Nail Exam: Pt has thick disfigured discolored nails with subungual debris noted bilateral entire nail hallux through fifth toenails Ulcer Exam: There is no evidence of ulcer or pre-ulcerative changes or infection. Orthopedic Exam: Muscle tone and strength are WNL. No limitations in general ROM. No crepitus or effusions noted. Foot type and digits show no abnormalities. Hallux malleus noted. Skin: No Porokeratosis. No infection or ulcers  Diagnosis:  Tinea unguium, Pain in right toe, pain in left toes  Treatment & Plan Procedures and Treatment: Consent by patient was obtained for treatment procedures. The patient understood the discussion of treatment and procedures well. All questions were answered thoroughly reviewed. Debridement of mycotic and hypertrophic toenails, 1 through 5 bilateral and clearing of subungual debris. No ulceration, no infection noted.  Return Visit-Office Procedure: Patient instructed to return to the office for a follow up visit 3 months for continued evaluation and treatment.   Gardiner Barefoot DPM

## 2016-12-26 DIAGNOSIS — D509 Iron deficiency anemia, unspecified: Secondary | ICD-10-CM | POA: Diagnosis not present

## 2016-12-26 DIAGNOSIS — E119 Type 2 diabetes mellitus without complications: Secondary | ICD-10-CM | POA: Diagnosis not present

## 2016-12-26 DIAGNOSIS — D631 Anemia in chronic kidney disease: Secondary | ICD-10-CM | POA: Diagnosis not present

## 2016-12-26 DIAGNOSIS — N2581 Secondary hyperparathyroidism of renal origin: Secondary | ICD-10-CM | POA: Diagnosis not present

## 2016-12-26 DIAGNOSIS — N186 End stage renal disease: Secondary | ICD-10-CM | POA: Diagnosis not present

## 2016-12-28 DIAGNOSIS — N2581 Secondary hyperparathyroidism of renal origin: Secondary | ICD-10-CM | POA: Diagnosis not present

## 2016-12-28 DIAGNOSIS — N186 End stage renal disease: Secondary | ICD-10-CM | POA: Diagnosis not present

## 2016-12-28 DIAGNOSIS — D631 Anemia in chronic kidney disease: Secondary | ICD-10-CM | POA: Diagnosis not present

## 2016-12-28 DIAGNOSIS — E119 Type 2 diabetes mellitus without complications: Secondary | ICD-10-CM | POA: Diagnosis not present

## 2016-12-28 DIAGNOSIS — D509 Iron deficiency anemia, unspecified: Secondary | ICD-10-CM | POA: Diagnosis not present

## 2016-12-31 DIAGNOSIS — D631 Anemia in chronic kidney disease: Secondary | ICD-10-CM | POA: Diagnosis not present

## 2016-12-31 DIAGNOSIS — D509 Iron deficiency anemia, unspecified: Secondary | ICD-10-CM | POA: Diagnosis not present

## 2016-12-31 DIAGNOSIS — N186 End stage renal disease: Secondary | ICD-10-CM | POA: Diagnosis not present

## 2016-12-31 DIAGNOSIS — N2581 Secondary hyperparathyroidism of renal origin: Secondary | ICD-10-CM | POA: Diagnosis not present

## 2016-12-31 DIAGNOSIS — E119 Type 2 diabetes mellitus without complications: Secondary | ICD-10-CM | POA: Diagnosis not present

## 2017-01-02 DIAGNOSIS — E119 Type 2 diabetes mellitus without complications: Secondary | ICD-10-CM | POA: Diagnosis not present

## 2017-01-02 DIAGNOSIS — N2581 Secondary hyperparathyroidism of renal origin: Secondary | ICD-10-CM | POA: Diagnosis not present

## 2017-01-02 DIAGNOSIS — N186 End stage renal disease: Secondary | ICD-10-CM | POA: Diagnosis not present

## 2017-01-02 DIAGNOSIS — D509 Iron deficiency anemia, unspecified: Secondary | ICD-10-CM | POA: Diagnosis not present

## 2017-01-02 DIAGNOSIS — D631 Anemia in chronic kidney disease: Secondary | ICD-10-CM | POA: Diagnosis not present

## 2017-01-04 DIAGNOSIS — N2581 Secondary hyperparathyroidism of renal origin: Secondary | ICD-10-CM | POA: Diagnosis not present

## 2017-01-04 DIAGNOSIS — N186 End stage renal disease: Secondary | ICD-10-CM | POA: Diagnosis not present

## 2017-01-04 DIAGNOSIS — E119 Type 2 diabetes mellitus without complications: Secondary | ICD-10-CM | POA: Diagnosis not present

## 2017-01-04 DIAGNOSIS — D509 Iron deficiency anemia, unspecified: Secondary | ICD-10-CM | POA: Diagnosis not present

## 2017-01-04 DIAGNOSIS — D631 Anemia in chronic kidney disease: Secondary | ICD-10-CM | POA: Diagnosis not present

## 2017-01-05 ENCOUNTER — Other Ambulatory Visit: Payer: Self-pay | Admitting: Internal Medicine

## 2017-01-05 DIAGNOSIS — E1122 Type 2 diabetes mellitus with diabetic chronic kidney disease: Secondary | ICD-10-CM

## 2017-01-05 DIAGNOSIS — IMO0002 Reserved for concepts with insufficient information to code with codable children: Secondary | ICD-10-CM

## 2017-01-05 DIAGNOSIS — E1165 Type 2 diabetes mellitus with hyperglycemia: Principal | ICD-10-CM

## 2017-01-05 DIAGNOSIS — N186 End stage renal disease: Principal | ICD-10-CM

## 2017-01-07 DIAGNOSIS — E119 Type 2 diabetes mellitus without complications: Secondary | ICD-10-CM | POA: Diagnosis not present

## 2017-01-07 DIAGNOSIS — N186 End stage renal disease: Secondary | ICD-10-CM | POA: Diagnosis not present

## 2017-01-07 DIAGNOSIS — D509 Iron deficiency anemia, unspecified: Secondary | ICD-10-CM | POA: Diagnosis not present

## 2017-01-07 DIAGNOSIS — N2581 Secondary hyperparathyroidism of renal origin: Secondary | ICD-10-CM | POA: Diagnosis not present

## 2017-01-07 DIAGNOSIS — D631 Anemia in chronic kidney disease: Secondary | ICD-10-CM | POA: Diagnosis not present

## 2017-01-09 DIAGNOSIS — N2581 Secondary hyperparathyroidism of renal origin: Secondary | ICD-10-CM | POA: Diagnosis not present

## 2017-01-09 DIAGNOSIS — D509 Iron deficiency anemia, unspecified: Secondary | ICD-10-CM | POA: Diagnosis not present

## 2017-01-09 DIAGNOSIS — N186 End stage renal disease: Secondary | ICD-10-CM | POA: Diagnosis not present

## 2017-01-09 DIAGNOSIS — E119 Type 2 diabetes mellitus without complications: Secondary | ICD-10-CM | POA: Diagnosis not present

## 2017-01-09 DIAGNOSIS — D631 Anemia in chronic kidney disease: Secondary | ICD-10-CM | POA: Diagnosis not present

## 2017-01-11 DIAGNOSIS — N186 End stage renal disease: Secondary | ICD-10-CM | POA: Diagnosis not present

## 2017-01-11 DIAGNOSIS — D631 Anemia in chronic kidney disease: Secondary | ICD-10-CM | POA: Diagnosis not present

## 2017-01-11 DIAGNOSIS — N2581 Secondary hyperparathyroidism of renal origin: Secondary | ICD-10-CM | POA: Diagnosis not present

## 2017-01-11 DIAGNOSIS — D509 Iron deficiency anemia, unspecified: Secondary | ICD-10-CM | POA: Diagnosis not present

## 2017-01-11 DIAGNOSIS — E119 Type 2 diabetes mellitus without complications: Secondary | ICD-10-CM | POA: Diagnosis not present

## 2017-01-12 DIAGNOSIS — E1129 Type 2 diabetes mellitus with other diabetic kidney complication: Secondary | ICD-10-CM | POA: Diagnosis not present

## 2017-01-12 DIAGNOSIS — N186 End stage renal disease: Secondary | ICD-10-CM | POA: Diagnosis not present

## 2017-01-12 DIAGNOSIS — Z992 Dependence on renal dialysis: Secondary | ICD-10-CM | POA: Diagnosis not present

## 2017-01-14 DIAGNOSIS — E119 Type 2 diabetes mellitus without complications: Secondary | ICD-10-CM | POA: Diagnosis not present

## 2017-01-14 DIAGNOSIS — D631 Anemia in chronic kidney disease: Secondary | ICD-10-CM | POA: Diagnosis not present

## 2017-01-14 DIAGNOSIS — N186 End stage renal disease: Secondary | ICD-10-CM | POA: Diagnosis not present

## 2017-01-14 DIAGNOSIS — N2581 Secondary hyperparathyroidism of renal origin: Secondary | ICD-10-CM | POA: Diagnosis not present

## 2017-01-14 DIAGNOSIS — D509 Iron deficiency anemia, unspecified: Secondary | ICD-10-CM | POA: Diagnosis not present

## 2017-01-16 DIAGNOSIS — D509 Iron deficiency anemia, unspecified: Secondary | ICD-10-CM | POA: Diagnosis not present

## 2017-01-16 DIAGNOSIS — N2581 Secondary hyperparathyroidism of renal origin: Secondary | ICD-10-CM | POA: Diagnosis not present

## 2017-01-16 DIAGNOSIS — D631 Anemia in chronic kidney disease: Secondary | ICD-10-CM | POA: Diagnosis not present

## 2017-01-16 DIAGNOSIS — N186 End stage renal disease: Secondary | ICD-10-CM | POA: Diagnosis not present

## 2017-01-16 DIAGNOSIS — E119 Type 2 diabetes mellitus without complications: Secondary | ICD-10-CM | POA: Diagnosis not present

## 2017-01-18 DIAGNOSIS — N2581 Secondary hyperparathyroidism of renal origin: Secondary | ICD-10-CM | POA: Diagnosis not present

## 2017-01-18 DIAGNOSIS — D631 Anemia in chronic kidney disease: Secondary | ICD-10-CM | POA: Diagnosis not present

## 2017-01-18 DIAGNOSIS — D509 Iron deficiency anemia, unspecified: Secondary | ICD-10-CM | POA: Diagnosis not present

## 2017-01-18 DIAGNOSIS — N186 End stage renal disease: Secondary | ICD-10-CM | POA: Diagnosis not present

## 2017-01-18 DIAGNOSIS — E119 Type 2 diabetes mellitus without complications: Secondary | ICD-10-CM | POA: Diagnosis not present

## 2017-01-21 DIAGNOSIS — D631 Anemia in chronic kidney disease: Secondary | ICD-10-CM | POA: Diagnosis not present

## 2017-01-21 DIAGNOSIS — N2581 Secondary hyperparathyroidism of renal origin: Secondary | ICD-10-CM | POA: Diagnosis not present

## 2017-01-21 DIAGNOSIS — N186 End stage renal disease: Secondary | ICD-10-CM | POA: Diagnosis not present

## 2017-01-21 DIAGNOSIS — D509 Iron deficiency anemia, unspecified: Secondary | ICD-10-CM | POA: Diagnosis not present

## 2017-01-21 DIAGNOSIS — E119 Type 2 diabetes mellitus without complications: Secondary | ICD-10-CM | POA: Diagnosis not present

## 2017-01-23 DIAGNOSIS — E119 Type 2 diabetes mellitus without complications: Secondary | ICD-10-CM | POA: Diagnosis not present

## 2017-01-23 DIAGNOSIS — D631 Anemia in chronic kidney disease: Secondary | ICD-10-CM | POA: Diagnosis not present

## 2017-01-23 DIAGNOSIS — N2581 Secondary hyperparathyroidism of renal origin: Secondary | ICD-10-CM | POA: Diagnosis not present

## 2017-01-23 DIAGNOSIS — N186 End stage renal disease: Secondary | ICD-10-CM | POA: Diagnosis not present

## 2017-01-23 DIAGNOSIS — D509 Iron deficiency anemia, unspecified: Secondary | ICD-10-CM | POA: Diagnosis not present

## 2017-01-25 DIAGNOSIS — D631 Anemia in chronic kidney disease: Secondary | ICD-10-CM | POA: Diagnosis not present

## 2017-01-25 DIAGNOSIS — N186 End stage renal disease: Secondary | ICD-10-CM | POA: Diagnosis not present

## 2017-01-25 DIAGNOSIS — D509 Iron deficiency anemia, unspecified: Secondary | ICD-10-CM | POA: Diagnosis not present

## 2017-01-25 DIAGNOSIS — N2581 Secondary hyperparathyroidism of renal origin: Secondary | ICD-10-CM | POA: Diagnosis not present

## 2017-01-25 DIAGNOSIS — E119 Type 2 diabetes mellitus without complications: Secondary | ICD-10-CM | POA: Diagnosis not present

## 2017-01-28 DIAGNOSIS — N186 End stage renal disease: Secondary | ICD-10-CM | POA: Diagnosis not present

## 2017-01-28 DIAGNOSIS — D631 Anemia in chronic kidney disease: Secondary | ICD-10-CM | POA: Diagnosis not present

## 2017-01-28 DIAGNOSIS — D509 Iron deficiency anemia, unspecified: Secondary | ICD-10-CM | POA: Diagnosis not present

## 2017-01-28 DIAGNOSIS — E119 Type 2 diabetes mellitus without complications: Secondary | ICD-10-CM | POA: Diagnosis not present

## 2017-01-28 DIAGNOSIS — N2581 Secondary hyperparathyroidism of renal origin: Secondary | ICD-10-CM | POA: Diagnosis not present

## 2017-01-30 DIAGNOSIS — D509 Iron deficiency anemia, unspecified: Secondary | ICD-10-CM | POA: Diagnosis not present

## 2017-01-30 DIAGNOSIS — D631 Anemia in chronic kidney disease: Secondary | ICD-10-CM | POA: Diagnosis not present

## 2017-01-30 DIAGNOSIS — N186 End stage renal disease: Secondary | ICD-10-CM | POA: Diagnosis not present

## 2017-01-30 DIAGNOSIS — E119 Type 2 diabetes mellitus without complications: Secondary | ICD-10-CM | POA: Diagnosis not present

## 2017-01-30 DIAGNOSIS — N2581 Secondary hyperparathyroidism of renal origin: Secondary | ICD-10-CM | POA: Diagnosis not present

## 2017-02-01 DIAGNOSIS — D509 Iron deficiency anemia, unspecified: Secondary | ICD-10-CM | POA: Diagnosis not present

## 2017-02-01 DIAGNOSIS — D631 Anemia in chronic kidney disease: Secondary | ICD-10-CM | POA: Diagnosis not present

## 2017-02-01 DIAGNOSIS — N2581 Secondary hyperparathyroidism of renal origin: Secondary | ICD-10-CM | POA: Diagnosis not present

## 2017-02-01 DIAGNOSIS — N186 End stage renal disease: Secondary | ICD-10-CM | POA: Diagnosis not present

## 2017-02-01 DIAGNOSIS — E119 Type 2 diabetes mellitus without complications: Secondary | ICD-10-CM | POA: Diagnosis not present

## 2017-02-04 DIAGNOSIS — E119 Type 2 diabetes mellitus without complications: Secondary | ICD-10-CM | POA: Diagnosis not present

## 2017-02-04 DIAGNOSIS — N2581 Secondary hyperparathyroidism of renal origin: Secondary | ICD-10-CM | POA: Diagnosis not present

## 2017-02-04 DIAGNOSIS — D631 Anemia in chronic kidney disease: Secondary | ICD-10-CM | POA: Diagnosis not present

## 2017-02-04 DIAGNOSIS — D509 Iron deficiency anemia, unspecified: Secondary | ICD-10-CM | POA: Diagnosis not present

## 2017-02-04 DIAGNOSIS — N186 End stage renal disease: Secondary | ICD-10-CM | POA: Diagnosis not present

## 2017-02-06 DIAGNOSIS — N2581 Secondary hyperparathyroidism of renal origin: Secondary | ICD-10-CM | POA: Diagnosis not present

## 2017-02-06 DIAGNOSIS — D631 Anemia in chronic kidney disease: Secondary | ICD-10-CM | POA: Diagnosis not present

## 2017-02-06 DIAGNOSIS — E1129 Type 2 diabetes mellitus with other diabetic kidney complication: Secondary | ICD-10-CM | POA: Diagnosis not present

## 2017-02-06 DIAGNOSIS — E119 Type 2 diabetes mellitus without complications: Secondary | ICD-10-CM | POA: Diagnosis not present

## 2017-02-06 DIAGNOSIS — D509 Iron deficiency anemia, unspecified: Secondary | ICD-10-CM | POA: Diagnosis not present

## 2017-02-06 DIAGNOSIS — N186 End stage renal disease: Secondary | ICD-10-CM | POA: Diagnosis not present

## 2017-02-08 DIAGNOSIS — N186 End stage renal disease: Secondary | ICD-10-CM | POA: Diagnosis not present

## 2017-02-08 DIAGNOSIS — D631 Anemia in chronic kidney disease: Secondary | ICD-10-CM | POA: Diagnosis not present

## 2017-02-08 DIAGNOSIS — D509 Iron deficiency anemia, unspecified: Secondary | ICD-10-CM | POA: Diagnosis not present

## 2017-02-08 DIAGNOSIS — N2581 Secondary hyperparathyroidism of renal origin: Secondary | ICD-10-CM | POA: Diagnosis not present

## 2017-02-08 DIAGNOSIS — E119 Type 2 diabetes mellitus without complications: Secondary | ICD-10-CM | POA: Diagnosis not present

## 2017-02-11 ENCOUNTER — Other Ambulatory Visit: Payer: Self-pay

## 2017-02-11 DIAGNOSIS — E1122 Type 2 diabetes mellitus with diabetic chronic kidney disease: Secondary | ICD-10-CM

## 2017-02-11 DIAGNOSIS — D631 Anemia in chronic kidney disease: Secondary | ICD-10-CM | POA: Diagnosis not present

## 2017-02-11 DIAGNOSIS — Z992 Dependence on renal dialysis: Secondary | ICD-10-CM | POA: Diagnosis not present

## 2017-02-11 DIAGNOSIS — IMO0002 Reserved for concepts with insufficient information to code with codable children: Secondary | ICD-10-CM

## 2017-02-11 DIAGNOSIS — N2581 Secondary hyperparathyroidism of renal origin: Secondary | ICD-10-CM | POA: Diagnosis not present

## 2017-02-11 DIAGNOSIS — N186 End stage renal disease: Secondary | ICD-10-CM | POA: Diagnosis not present

## 2017-02-11 DIAGNOSIS — E1129 Type 2 diabetes mellitus with other diabetic kidney complication: Secondary | ICD-10-CM | POA: Diagnosis not present

## 2017-02-11 DIAGNOSIS — E119 Type 2 diabetes mellitus without complications: Secondary | ICD-10-CM | POA: Diagnosis not present

## 2017-02-11 DIAGNOSIS — D509 Iron deficiency anemia, unspecified: Secondary | ICD-10-CM | POA: Diagnosis not present

## 2017-02-11 DIAGNOSIS — E1165 Type 2 diabetes mellitus with hyperglycemia: Principal | ICD-10-CM

## 2017-02-11 NOTE — Telephone Encounter (Signed)
Ernest [name] from [facility] requesting insulin glargine (LANTUS) 100 UNIT/ML injection to be filled.

## 2017-02-12 ENCOUNTER — Encounter: Payer: Self-pay | Admitting: Neurology

## 2017-02-12 MED ORDER — INSULIN GLARGINE 100 UNIT/ML ~~LOC~~ SOLN: 10.0000 [IU] | Freq: Every day | SUBCUTANEOUS | 1 refills | Status: DC

## 2017-02-12 NOTE — Telephone Encounter (Signed)
Patient needs appointment. Have not seen in over year.

## 2017-02-13 DIAGNOSIS — N186 End stage renal disease: Secondary | ICD-10-CM | POA: Diagnosis not present

## 2017-02-13 DIAGNOSIS — D631 Anemia in chronic kidney disease: Secondary | ICD-10-CM | POA: Diagnosis not present

## 2017-02-13 DIAGNOSIS — D509 Iron deficiency anemia, unspecified: Secondary | ICD-10-CM | POA: Diagnosis not present

## 2017-02-13 DIAGNOSIS — E119 Type 2 diabetes mellitus without complications: Secondary | ICD-10-CM | POA: Diagnosis not present

## 2017-02-13 DIAGNOSIS — N2581 Secondary hyperparathyroidism of renal origin: Secondary | ICD-10-CM | POA: Diagnosis not present

## 2017-02-15 DIAGNOSIS — N2581 Secondary hyperparathyroidism of renal origin: Secondary | ICD-10-CM | POA: Diagnosis not present

## 2017-02-15 DIAGNOSIS — D509 Iron deficiency anemia, unspecified: Secondary | ICD-10-CM | POA: Diagnosis not present

## 2017-02-15 DIAGNOSIS — N186 End stage renal disease: Secondary | ICD-10-CM | POA: Diagnosis not present

## 2017-02-15 DIAGNOSIS — E119 Type 2 diabetes mellitus without complications: Secondary | ICD-10-CM | POA: Diagnosis not present

## 2017-02-15 DIAGNOSIS — D631 Anemia in chronic kidney disease: Secondary | ICD-10-CM | POA: Diagnosis not present

## 2017-02-18 DIAGNOSIS — E119 Type 2 diabetes mellitus without complications: Secondary | ICD-10-CM | POA: Diagnosis not present

## 2017-02-18 DIAGNOSIS — D509 Iron deficiency anemia, unspecified: Secondary | ICD-10-CM | POA: Diagnosis not present

## 2017-02-18 DIAGNOSIS — N2581 Secondary hyperparathyroidism of renal origin: Secondary | ICD-10-CM | POA: Diagnosis not present

## 2017-02-18 DIAGNOSIS — N186 End stage renal disease: Secondary | ICD-10-CM | POA: Diagnosis not present

## 2017-02-18 DIAGNOSIS — D631 Anemia in chronic kidney disease: Secondary | ICD-10-CM | POA: Diagnosis not present

## 2017-02-20 DIAGNOSIS — N186 End stage renal disease: Secondary | ICD-10-CM | POA: Diagnosis not present

## 2017-02-20 DIAGNOSIS — E119 Type 2 diabetes mellitus without complications: Secondary | ICD-10-CM | POA: Diagnosis not present

## 2017-02-20 DIAGNOSIS — D509 Iron deficiency anemia, unspecified: Secondary | ICD-10-CM | POA: Diagnosis not present

## 2017-02-20 DIAGNOSIS — N2581 Secondary hyperparathyroidism of renal origin: Secondary | ICD-10-CM | POA: Diagnosis not present

## 2017-02-20 DIAGNOSIS — D631 Anemia in chronic kidney disease: Secondary | ICD-10-CM | POA: Diagnosis not present

## 2017-02-22 DIAGNOSIS — N2581 Secondary hyperparathyroidism of renal origin: Secondary | ICD-10-CM | POA: Diagnosis not present

## 2017-02-22 DIAGNOSIS — E119 Type 2 diabetes mellitus without complications: Secondary | ICD-10-CM | POA: Diagnosis not present

## 2017-02-22 DIAGNOSIS — D631 Anemia in chronic kidney disease: Secondary | ICD-10-CM | POA: Diagnosis not present

## 2017-02-22 DIAGNOSIS — N186 End stage renal disease: Secondary | ICD-10-CM | POA: Diagnosis not present

## 2017-02-22 DIAGNOSIS — D509 Iron deficiency anemia, unspecified: Secondary | ICD-10-CM | POA: Diagnosis not present

## 2017-02-25 DIAGNOSIS — E119 Type 2 diabetes mellitus without complications: Secondary | ICD-10-CM | POA: Diagnosis not present

## 2017-02-25 DIAGNOSIS — D631 Anemia in chronic kidney disease: Secondary | ICD-10-CM | POA: Diagnosis not present

## 2017-02-25 DIAGNOSIS — N186 End stage renal disease: Secondary | ICD-10-CM | POA: Diagnosis not present

## 2017-02-25 DIAGNOSIS — D509 Iron deficiency anemia, unspecified: Secondary | ICD-10-CM | POA: Diagnosis not present

## 2017-02-25 DIAGNOSIS — N2581 Secondary hyperparathyroidism of renal origin: Secondary | ICD-10-CM | POA: Diagnosis not present

## 2017-02-27 DIAGNOSIS — D631 Anemia in chronic kidney disease: Secondary | ICD-10-CM | POA: Diagnosis not present

## 2017-02-27 DIAGNOSIS — E119 Type 2 diabetes mellitus without complications: Secondary | ICD-10-CM | POA: Diagnosis not present

## 2017-02-27 DIAGNOSIS — N186 End stage renal disease: Secondary | ICD-10-CM | POA: Diagnosis not present

## 2017-02-27 DIAGNOSIS — N2581 Secondary hyperparathyroidism of renal origin: Secondary | ICD-10-CM | POA: Diagnosis not present

## 2017-02-27 DIAGNOSIS — D509 Iron deficiency anemia, unspecified: Secondary | ICD-10-CM | POA: Diagnosis not present

## 2017-03-01 DIAGNOSIS — D631 Anemia in chronic kidney disease: Secondary | ICD-10-CM | POA: Diagnosis not present

## 2017-03-01 DIAGNOSIS — N186 End stage renal disease: Secondary | ICD-10-CM | POA: Diagnosis not present

## 2017-03-01 DIAGNOSIS — N2581 Secondary hyperparathyroidism of renal origin: Secondary | ICD-10-CM | POA: Diagnosis not present

## 2017-03-01 DIAGNOSIS — E119 Type 2 diabetes mellitus without complications: Secondary | ICD-10-CM | POA: Diagnosis not present

## 2017-03-01 DIAGNOSIS — D509 Iron deficiency anemia, unspecified: Secondary | ICD-10-CM | POA: Diagnosis not present

## 2017-03-04 DIAGNOSIS — N2581 Secondary hyperparathyroidism of renal origin: Secondary | ICD-10-CM | POA: Diagnosis not present

## 2017-03-04 DIAGNOSIS — D631 Anemia in chronic kidney disease: Secondary | ICD-10-CM | POA: Diagnosis not present

## 2017-03-04 DIAGNOSIS — E119 Type 2 diabetes mellitus without complications: Secondary | ICD-10-CM | POA: Diagnosis not present

## 2017-03-04 DIAGNOSIS — N186 End stage renal disease: Secondary | ICD-10-CM | POA: Diagnosis not present

## 2017-03-04 DIAGNOSIS — D509 Iron deficiency anemia, unspecified: Secondary | ICD-10-CM | POA: Diagnosis not present

## 2017-03-06 DIAGNOSIS — N2581 Secondary hyperparathyroidism of renal origin: Secondary | ICD-10-CM | POA: Diagnosis not present

## 2017-03-06 DIAGNOSIS — N186 End stage renal disease: Secondary | ICD-10-CM | POA: Diagnosis not present

## 2017-03-06 DIAGNOSIS — T82858A Stenosis of vascular prosthetic devices, implants and grafts, initial encounter: Secondary | ICD-10-CM | POA: Diagnosis not present

## 2017-03-06 DIAGNOSIS — Z992 Dependence on renal dialysis: Secondary | ICD-10-CM | POA: Diagnosis not present

## 2017-03-06 DIAGNOSIS — D509 Iron deficiency anemia, unspecified: Secondary | ICD-10-CM | POA: Diagnosis not present

## 2017-03-06 DIAGNOSIS — D631 Anemia in chronic kidney disease: Secondary | ICD-10-CM | POA: Diagnosis not present

## 2017-03-06 DIAGNOSIS — I871 Compression of vein: Secondary | ICD-10-CM | POA: Diagnosis not present

## 2017-03-06 DIAGNOSIS — E119 Type 2 diabetes mellitus without complications: Secondary | ICD-10-CM | POA: Diagnosis not present

## 2017-03-08 DIAGNOSIS — D509 Iron deficiency anemia, unspecified: Secondary | ICD-10-CM | POA: Diagnosis not present

## 2017-03-08 DIAGNOSIS — D631 Anemia in chronic kidney disease: Secondary | ICD-10-CM | POA: Diagnosis not present

## 2017-03-08 DIAGNOSIS — N186 End stage renal disease: Secondary | ICD-10-CM | POA: Diagnosis not present

## 2017-03-08 DIAGNOSIS — N2581 Secondary hyperparathyroidism of renal origin: Secondary | ICD-10-CM | POA: Diagnosis not present

## 2017-03-08 DIAGNOSIS — E119 Type 2 diabetes mellitus without complications: Secondary | ICD-10-CM | POA: Diagnosis not present

## 2017-03-11 DIAGNOSIS — D631 Anemia in chronic kidney disease: Secondary | ICD-10-CM | POA: Diagnosis not present

## 2017-03-11 DIAGNOSIS — E119 Type 2 diabetes mellitus without complications: Secondary | ICD-10-CM | POA: Diagnosis not present

## 2017-03-11 DIAGNOSIS — D509 Iron deficiency anemia, unspecified: Secondary | ICD-10-CM | POA: Diagnosis not present

## 2017-03-11 DIAGNOSIS — N186 End stage renal disease: Secondary | ICD-10-CM | POA: Diagnosis not present

## 2017-03-11 DIAGNOSIS — N2581 Secondary hyperparathyroidism of renal origin: Secondary | ICD-10-CM | POA: Diagnosis not present

## 2017-03-13 ENCOUNTER — Ambulatory Visit: Payer: Medicare Other

## 2017-03-13 DIAGNOSIS — E119 Type 2 diabetes mellitus without complications: Secondary | ICD-10-CM | POA: Diagnosis not present

## 2017-03-13 DIAGNOSIS — N2581 Secondary hyperparathyroidism of renal origin: Secondary | ICD-10-CM | POA: Diagnosis not present

## 2017-03-13 DIAGNOSIS — D509 Iron deficiency anemia, unspecified: Secondary | ICD-10-CM | POA: Diagnosis not present

## 2017-03-13 DIAGNOSIS — N186 End stage renal disease: Secondary | ICD-10-CM | POA: Diagnosis not present

## 2017-03-13 DIAGNOSIS — D631 Anemia in chronic kidney disease: Secondary | ICD-10-CM | POA: Diagnosis not present

## 2017-03-14 DIAGNOSIS — Z992 Dependence on renal dialysis: Secondary | ICD-10-CM | POA: Diagnosis not present

## 2017-03-14 DIAGNOSIS — E1129 Type 2 diabetes mellitus with other diabetic kidney complication: Secondary | ICD-10-CM | POA: Diagnosis not present

## 2017-03-14 DIAGNOSIS — N186 End stage renal disease: Secondary | ICD-10-CM | POA: Diagnosis not present

## 2017-03-15 DIAGNOSIS — E119 Type 2 diabetes mellitus without complications: Secondary | ICD-10-CM | POA: Diagnosis not present

## 2017-03-15 DIAGNOSIS — D509 Iron deficiency anemia, unspecified: Secondary | ICD-10-CM | POA: Diagnosis not present

## 2017-03-15 DIAGNOSIS — N2581 Secondary hyperparathyroidism of renal origin: Secondary | ICD-10-CM | POA: Diagnosis not present

## 2017-03-15 DIAGNOSIS — N186 End stage renal disease: Secondary | ICD-10-CM | POA: Diagnosis not present

## 2017-03-15 DIAGNOSIS — D631 Anemia in chronic kidney disease: Secondary | ICD-10-CM | POA: Diagnosis not present

## 2017-03-19 DIAGNOSIS — N186 End stage renal disease: Secondary | ICD-10-CM | POA: Diagnosis not present

## 2017-03-19 DIAGNOSIS — E119 Type 2 diabetes mellitus without complications: Secondary | ICD-10-CM | POA: Diagnosis not present

## 2017-03-19 DIAGNOSIS — D509 Iron deficiency anemia, unspecified: Secondary | ICD-10-CM | POA: Diagnosis not present

## 2017-03-19 DIAGNOSIS — D631 Anemia in chronic kidney disease: Secondary | ICD-10-CM | POA: Diagnosis not present

## 2017-03-19 DIAGNOSIS — N2581 Secondary hyperparathyroidism of renal origin: Secondary | ICD-10-CM | POA: Diagnosis not present

## 2017-03-20 DIAGNOSIS — E119 Type 2 diabetes mellitus without complications: Secondary | ICD-10-CM | POA: Diagnosis not present

## 2017-03-20 DIAGNOSIS — D631 Anemia in chronic kidney disease: Secondary | ICD-10-CM | POA: Diagnosis not present

## 2017-03-20 DIAGNOSIS — N186 End stage renal disease: Secondary | ICD-10-CM | POA: Diagnosis not present

## 2017-03-20 DIAGNOSIS — D509 Iron deficiency anemia, unspecified: Secondary | ICD-10-CM | POA: Diagnosis not present

## 2017-03-20 DIAGNOSIS — N2581 Secondary hyperparathyroidism of renal origin: Secondary | ICD-10-CM | POA: Diagnosis not present

## 2017-03-22 DIAGNOSIS — E119 Type 2 diabetes mellitus without complications: Secondary | ICD-10-CM | POA: Diagnosis not present

## 2017-03-22 DIAGNOSIS — N186 End stage renal disease: Secondary | ICD-10-CM | POA: Diagnosis not present

## 2017-03-22 DIAGNOSIS — D631 Anemia in chronic kidney disease: Secondary | ICD-10-CM | POA: Diagnosis not present

## 2017-03-22 DIAGNOSIS — D509 Iron deficiency anemia, unspecified: Secondary | ICD-10-CM | POA: Diagnosis not present

## 2017-03-22 DIAGNOSIS — N2581 Secondary hyperparathyroidism of renal origin: Secondary | ICD-10-CM | POA: Diagnosis not present

## 2017-03-25 DIAGNOSIS — E119 Type 2 diabetes mellitus without complications: Secondary | ICD-10-CM | POA: Diagnosis not present

## 2017-03-25 DIAGNOSIS — D509 Iron deficiency anemia, unspecified: Secondary | ICD-10-CM | POA: Diagnosis not present

## 2017-03-25 DIAGNOSIS — N2581 Secondary hyperparathyroidism of renal origin: Secondary | ICD-10-CM | POA: Diagnosis not present

## 2017-03-25 DIAGNOSIS — N186 End stage renal disease: Secondary | ICD-10-CM | POA: Diagnosis not present

## 2017-03-25 DIAGNOSIS — D631 Anemia in chronic kidney disease: Secondary | ICD-10-CM | POA: Diagnosis not present

## 2017-03-26 ENCOUNTER — Encounter: Payer: Self-pay | Admitting: Podiatry

## 2017-03-26 ENCOUNTER — Ambulatory Visit (INDEPENDENT_AMBULATORY_CARE_PROVIDER_SITE_OTHER): Payer: Medicare Other | Admitting: Podiatry

## 2017-03-26 DIAGNOSIS — E1142 Type 2 diabetes mellitus with diabetic polyneuropathy: Secondary | ICD-10-CM

## 2017-03-26 DIAGNOSIS — M79676 Pain in unspecified toe(s): Secondary | ICD-10-CM

## 2017-03-26 DIAGNOSIS — B351 Tinea unguium: Secondary | ICD-10-CM | POA: Diagnosis not present

## 2017-03-26 NOTE — Progress Notes (Signed)
Patient ID: Ernest [name], male   DOB: [birth year], 71 y.o.   MRN: [id number] Complaint:  Visit Type: Patient returns to my office for continued preventative foot care services. Complaint: Patient states" my nails have grown long and thick and become painful to walk and wear shoes" Patient has been diagnosed with DM with neuropathy. He presents for preventative foot care services. No changes to ROS  Podiatric Exam: Vascular: dorsalis pedis and posterior tibial pulses are palpable bilateral. Capillary return is immediate. Temperature gradient is WNL. Skin turgor WNL  Sensorium: Diminished n Semmes Weinstein monofilament test. Normal tactile sensation bilaterally. Nail Exam: Pt has thick disfigured discolored nails with subungual debris noted bilateral entire nail hallux through fifth toenails Ulcer Exam: There is no evidence of ulcer or pre-ulcerative changes or infection. Orthopedic Exam: Muscle tone and strength are WNL. No limitations in general ROM. No crepitus or effusions noted. Foot type and digits show no abnormalities. Hallux malleus noted. Mild HAV  B/L. Skin: No Porokeratosis. No infection or ulcers  Diagnosis:  Tinea unguium, Pain in right toe, pain in left toes  Treatment & Plan Procedures and Treatment: Consent by patient was obtained for treatment procedures. The patient understood the discussion of treatment and procedures well. All questions were answered thoroughly reviewed. Debridement of mycotic and hypertrophic toenails, 1 through 5 bilateral and clearing of subungual debris. No ulceration, no infection noted. Initiate paperwork for diabetic shoes for DPN,  HAV and hallux malleus Return Visit-Office Procedure: Patient instructed to return to the office for a follow up visit 3 months for continued evaluation and treatment.   Gardiner Barefoot DPM

## 2017-03-27 DIAGNOSIS — E119 Type 2 diabetes mellitus without complications: Secondary | ICD-10-CM | POA: Diagnosis not present

## 2017-03-27 DIAGNOSIS — D509 Iron deficiency anemia, unspecified: Secondary | ICD-10-CM | POA: Diagnosis not present

## 2017-03-27 DIAGNOSIS — D631 Anemia in chronic kidney disease: Secondary | ICD-10-CM | POA: Diagnosis not present

## 2017-03-27 DIAGNOSIS — N186 End stage renal disease: Secondary | ICD-10-CM | POA: Diagnosis not present

## 2017-03-27 DIAGNOSIS — N2581 Secondary hyperparathyroidism of renal origin: Secondary | ICD-10-CM | POA: Diagnosis not present

## 2017-03-29 DIAGNOSIS — N2581 Secondary hyperparathyroidism of renal origin: Secondary | ICD-10-CM | POA: Diagnosis not present

## 2017-03-29 DIAGNOSIS — N186 End stage renal disease: Secondary | ICD-10-CM | POA: Diagnosis not present

## 2017-03-29 DIAGNOSIS — D631 Anemia in chronic kidney disease: Secondary | ICD-10-CM | POA: Diagnosis not present

## 2017-03-29 DIAGNOSIS — D509 Iron deficiency anemia, unspecified: Secondary | ICD-10-CM | POA: Diagnosis not present

## 2017-03-29 DIAGNOSIS — E119 Type 2 diabetes mellitus without complications: Secondary | ICD-10-CM | POA: Diagnosis not present

## 2017-04-01 DIAGNOSIS — N2581 Secondary hyperparathyroidism of renal origin: Secondary | ICD-10-CM | POA: Diagnosis not present

## 2017-04-01 DIAGNOSIS — D509 Iron deficiency anemia, unspecified: Secondary | ICD-10-CM | POA: Diagnosis not present

## 2017-04-01 DIAGNOSIS — E119 Type 2 diabetes mellitus without complications: Secondary | ICD-10-CM | POA: Diagnosis not present

## 2017-04-01 DIAGNOSIS — N186 End stage renal disease: Secondary | ICD-10-CM | POA: Diagnosis not present

## 2017-04-01 DIAGNOSIS — D631 Anemia in chronic kidney disease: Secondary | ICD-10-CM | POA: Diagnosis not present

## 2017-04-03 DIAGNOSIS — N2581 Secondary hyperparathyroidism of renal origin: Secondary | ICD-10-CM | POA: Diagnosis not present

## 2017-04-03 DIAGNOSIS — D509 Iron deficiency anemia, unspecified: Secondary | ICD-10-CM | POA: Diagnosis not present

## 2017-04-03 DIAGNOSIS — E119 Type 2 diabetes mellitus without complications: Secondary | ICD-10-CM | POA: Diagnosis not present

## 2017-04-03 DIAGNOSIS — D631 Anemia in chronic kidney disease: Secondary | ICD-10-CM | POA: Diagnosis not present

## 2017-04-03 DIAGNOSIS — N186 End stage renal disease: Secondary | ICD-10-CM | POA: Diagnosis not present

## 2017-04-05 DIAGNOSIS — D631 Anemia in chronic kidney disease: Secondary | ICD-10-CM | POA: Diagnosis not present

## 2017-04-05 DIAGNOSIS — N2581 Secondary hyperparathyroidism of renal origin: Secondary | ICD-10-CM | POA: Diagnosis not present

## 2017-04-05 DIAGNOSIS — D509 Iron deficiency anemia, unspecified: Secondary | ICD-10-CM | POA: Diagnosis not present

## 2017-04-05 DIAGNOSIS — E119 Type 2 diabetes mellitus without complications: Secondary | ICD-10-CM | POA: Diagnosis not present

## 2017-04-05 DIAGNOSIS — N186 End stage renal disease: Secondary | ICD-10-CM | POA: Diagnosis not present

## 2017-04-08 DIAGNOSIS — E119 Type 2 diabetes mellitus without complications: Secondary | ICD-10-CM | POA: Diagnosis not present

## 2017-04-08 DIAGNOSIS — N2581 Secondary hyperparathyroidism of renal origin: Secondary | ICD-10-CM | POA: Diagnosis not present

## 2017-04-08 DIAGNOSIS — D509 Iron deficiency anemia, unspecified: Secondary | ICD-10-CM | POA: Diagnosis not present

## 2017-04-08 DIAGNOSIS — N186 End stage renal disease: Secondary | ICD-10-CM | POA: Diagnosis not present

## 2017-04-08 DIAGNOSIS — D631 Anemia in chronic kidney disease: Secondary | ICD-10-CM | POA: Diagnosis not present

## 2017-04-10 DIAGNOSIS — D631 Anemia in chronic kidney disease: Secondary | ICD-10-CM | POA: Diagnosis not present

## 2017-04-10 DIAGNOSIS — N186 End stage renal disease: Secondary | ICD-10-CM | POA: Diagnosis not present

## 2017-04-10 DIAGNOSIS — E119 Type 2 diabetes mellitus without complications: Secondary | ICD-10-CM | POA: Diagnosis not present

## 2017-04-10 DIAGNOSIS — D509 Iron deficiency anemia, unspecified: Secondary | ICD-10-CM | POA: Diagnosis not present

## 2017-04-10 DIAGNOSIS — N2581 Secondary hyperparathyroidism of renal origin: Secondary | ICD-10-CM | POA: Diagnosis not present

## 2017-04-12 DIAGNOSIS — N186 End stage renal disease: Secondary | ICD-10-CM | POA: Diagnosis not present

## 2017-04-12 DIAGNOSIS — N2581 Secondary hyperparathyroidism of renal origin: Secondary | ICD-10-CM | POA: Diagnosis not present

## 2017-04-12 DIAGNOSIS — E119 Type 2 diabetes mellitus without complications: Secondary | ICD-10-CM | POA: Diagnosis not present

## 2017-04-12 DIAGNOSIS — D509 Iron deficiency anemia, unspecified: Secondary | ICD-10-CM | POA: Diagnosis not present

## 2017-04-12 DIAGNOSIS — D631 Anemia in chronic kidney disease: Secondary | ICD-10-CM | POA: Diagnosis not present

## 2017-04-13 DIAGNOSIS — Z992 Dependence on renal dialysis: Secondary | ICD-10-CM | POA: Diagnosis not present

## 2017-04-13 DIAGNOSIS — N186 End stage renal disease: Secondary | ICD-10-CM | POA: Diagnosis not present

## 2017-04-13 DIAGNOSIS — E1129 Type 2 diabetes mellitus with other diabetic kidney complication: Secondary | ICD-10-CM | POA: Diagnosis not present

## 2017-04-15 DIAGNOSIS — D631 Anemia in chronic kidney disease: Secondary | ICD-10-CM | POA: Diagnosis not present

## 2017-04-15 DIAGNOSIS — N2581 Secondary hyperparathyroidism of renal origin: Secondary | ICD-10-CM | POA: Diagnosis not present

## 2017-04-15 DIAGNOSIS — D509 Iron deficiency anemia, unspecified: Secondary | ICD-10-CM | POA: Diagnosis not present

## 2017-04-15 DIAGNOSIS — E119 Type 2 diabetes mellitus without complications: Secondary | ICD-10-CM | POA: Diagnosis not present

## 2017-04-15 DIAGNOSIS — N186 End stage renal disease: Secondary | ICD-10-CM | POA: Diagnosis not present

## 2017-04-17 DIAGNOSIS — D631 Anemia in chronic kidney disease: Secondary | ICD-10-CM | POA: Diagnosis not present

## 2017-04-17 DIAGNOSIS — N186 End stage renal disease: Secondary | ICD-10-CM | POA: Diagnosis not present

## 2017-04-17 DIAGNOSIS — D509 Iron deficiency anemia, unspecified: Secondary | ICD-10-CM | POA: Diagnosis not present

## 2017-04-17 DIAGNOSIS — N2581 Secondary hyperparathyroidism of renal origin: Secondary | ICD-10-CM | POA: Diagnosis not present

## 2017-04-17 DIAGNOSIS — E119 Type 2 diabetes mellitus without complications: Secondary | ICD-10-CM | POA: Diagnosis not present

## 2017-04-19 DIAGNOSIS — D509 Iron deficiency anemia, unspecified: Secondary | ICD-10-CM | POA: Diagnosis not present

## 2017-04-19 DIAGNOSIS — N186 End stage renal disease: Secondary | ICD-10-CM | POA: Diagnosis not present

## 2017-04-19 DIAGNOSIS — N2581 Secondary hyperparathyroidism of renal origin: Secondary | ICD-10-CM | POA: Diagnosis not present

## 2017-04-19 DIAGNOSIS — D631 Anemia in chronic kidney disease: Secondary | ICD-10-CM | POA: Diagnosis not present

## 2017-04-19 DIAGNOSIS — E119 Type 2 diabetes mellitus without complications: Secondary | ICD-10-CM | POA: Diagnosis not present

## 2017-04-22 DIAGNOSIS — D631 Anemia in chronic kidney disease: Secondary | ICD-10-CM | POA: Diagnosis not present

## 2017-04-22 DIAGNOSIS — E119 Type 2 diabetes mellitus without complications: Secondary | ICD-10-CM | POA: Diagnosis not present

## 2017-04-22 DIAGNOSIS — N186 End stage renal disease: Secondary | ICD-10-CM | POA: Diagnosis not present

## 2017-04-22 DIAGNOSIS — N2581 Secondary hyperparathyroidism of renal origin: Secondary | ICD-10-CM | POA: Diagnosis not present

## 2017-04-22 DIAGNOSIS — D509 Iron deficiency anemia, unspecified: Secondary | ICD-10-CM | POA: Diagnosis not present

## 2017-04-24 DIAGNOSIS — D509 Iron deficiency anemia, unspecified: Secondary | ICD-10-CM | POA: Diagnosis not present

## 2017-04-24 DIAGNOSIS — D631 Anemia in chronic kidney disease: Secondary | ICD-10-CM | POA: Diagnosis not present

## 2017-04-24 DIAGNOSIS — E119 Type 2 diabetes mellitus without complications: Secondary | ICD-10-CM | POA: Diagnosis not present

## 2017-04-24 DIAGNOSIS — N186 End stage renal disease: Secondary | ICD-10-CM | POA: Diagnosis not present

## 2017-04-24 DIAGNOSIS — N2581 Secondary hyperparathyroidism of renal origin: Secondary | ICD-10-CM | POA: Diagnosis not present

## 2017-04-26 DIAGNOSIS — D631 Anemia in chronic kidney disease: Secondary | ICD-10-CM | POA: Diagnosis not present

## 2017-04-26 DIAGNOSIS — D509 Iron deficiency anemia, unspecified: Secondary | ICD-10-CM | POA: Diagnosis not present

## 2017-04-26 DIAGNOSIS — N186 End stage renal disease: Secondary | ICD-10-CM | POA: Diagnosis not present

## 2017-04-26 DIAGNOSIS — N2581 Secondary hyperparathyroidism of renal origin: Secondary | ICD-10-CM | POA: Diagnosis not present

## 2017-04-26 DIAGNOSIS — E119 Type 2 diabetes mellitus without complications: Secondary | ICD-10-CM | POA: Diagnosis not present

## 2017-04-29 DIAGNOSIS — N186 End stage renal disease: Secondary | ICD-10-CM | POA: Diagnosis not present

## 2017-04-29 DIAGNOSIS — D631 Anemia in chronic kidney disease: Secondary | ICD-10-CM | POA: Diagnosis not present

## 2017-04-29 DIAGNOSIS — E119 Type 2 diabetes mellitus without complications: Secondary | ICD-10-CM | POA: Diagnosis not present

## 2017-04-29 DIAGNOSIS — D509 Iron deficiency anemia, unspecified: Secondary | ICD-10-CM | POA: Diagnosis not present

## 2017-04-29 DIAGNOSIS — N2581 Secondary hyperparathyroidism of renal origin: Secondary | ICD-10-CM | POA: Diagnosis not present

## 2017-05-01 DIAGNOSIS — D509 Iron deficiency anemia, unspecified: Secondary | ICD-10-CM | POA: Diagnosis not present

## 2017-05-01 DIAGNOSIS — E119 Type 2 diabetes mellitus without complications: Secondary | ICD-10-CM | POA: Diagnosis not present

## 2017-05-01 DIAGNOSIS — D631 Anemia in chronic kidney disease: Secondary | ICD-10-CM | POA: Diagnosis not present

## 2017-05-01 DIAGNOSIS — N186 End stage renal disease: Secondary | ICD-10-CM | POA: Diagnosis not present

## 2017-05-01 DIAGNOSIS — N2581 Secondary hyperparathyroidism of renal origin: Secondary | ICD-10-CM | POA: Diagnosis not present

## 2017-05-03 DIAGNOSIS — D631 Anemia in chronic kidney disease: Secondary | ICD-10-CM | POA: Diagnosis not present

## 2017-05-03 DIAGNOSIS — D509 Iron deficiency anemia, unspecified: Secondary | ICD-10-CM | POA: Diagnosis not present

## 2017-05-03 DIAGNOSIS — E119 Type 2 diabetes mellitus without complications: Secondary | ICD-10-CM | POA: Diagnosis not present

## 2017-05-03 DIAGNOSIS — N2581 Secondary hyperparathyroidism of renal origin: Secondary | ICD-10-CM | POA: Diagnosis not present

## 2017-05-03 DIAGNOSIS — N186 End stage renal disease: Secondary | ICD-10-CM | POA: Diagnosis not present

## 2017-05-06 DIAGNOSIS — N186 End stage renal disease: Secondary | ICD-10-CM | POA: Diagnosis not present

## 2017-05-06 DIAGNOSIS — D509 Iron deficiency anemia, unspecified: Secondary | ICD-10-CM | POA: Diagnosis not present

## 2017-05-06 DIAGNOSIS — E119 Type 2 diabetes mellitus without complications: Secondary | ICD-10-CM | POA: Diagnosis not present

## 2017-05-06 DIAGNOSIS — N2581 Secondary hyperparathyroidism of renal origin: Secondary | ICD-10-CM | POA: Diagnosis not present

## 2017-05-06 DIAGNOSIS — D631 Anemia in chronic kidney disease: Secondary | ICD-10-CM | POA: Diagnosis not present

## 2017-05-08 DIAGNOSIS — E1129 Type 2 diabetes mellitus with other diabetic kidney complication: Secondary | ICD-10-CM | POA: Diagnosis not present

## 2017-05-08 DIAGNOSIS — D631 Anemia in chronic kidney disease: Secondary | ICD-10-CM | POA: Diagnosis not present

## 2017-05-08 DIAGNOSIS — E119 Type 2 diabetes mellitus without complications: Secondary | ICD-10-CM | POA: Diagnosis not present

## 2017-05-08 DIAGNOSIS — N186 End stage renal disease: Secondary | ICD-10-CM | POA: Diagnosis not present

## 2017-05-08 DIAGNOSIS — N2581 Secondary hyperparathyroidism of renal origin: Secondary | ICD-10-CM | POA: Diagnosis not present

## 2017-05-08 DIAGNOSIS — D509 Iron deficiency anemia, unspecified: Secondary | ICD-10-CM | POA: Diagnosis not present

## 2017-05-10 DIAGNOSIS — N186 End stage renal disease: Secondary | ICD-10-CM | POA: Diagnosis not present

## 2017-05-10 DIAGNOSIS — N2581 Secondary hyperparathyroidism of renal origin: Secondary | ICD-10-CM | POA: Diagnosis not present

## 2017-05-10 DIAGNOSIS — E119 Type 2 diabetes mellitus without complications: Secondary | ICD-10-CM | POA: Diagnosis not present

## 2017-05-10 DIAGNOSIS — D509 Iron deficiency anemia, unspecified: Secondary | ICD-10-CM | POA: Diagnosis not present

## 2017-05-10 DIAGNOSIS — D631 Anemia in chronic kidney disease: Secondary | ICD-10-CM | POA: Diagnosis not present

## 2017-05-13 DIAGNOSIS — N186 End stage renal disease: Secondary | ICD-10-CM | POA: Diagnosis not present

## 2017-05-13 DIAGNOSIS — N2581 Secondary hyperparathyroidism of renal origin: Secondary | ICD-10-CM | POA: Diagnosis not present

## 2017-05-13 DIAGNOSIS — D631 Anemia in chronic kidney disease: Secondary | ICD-10-CM | POA: Diagnosis not present

## 2017-05-13 DIAGNOSIS — D509 Iron deficiency anemia, unspecified: Secondary | ICD-10-CM | POA: Diagnosis not present

## 2017-05-13 DIAGNOSIS — E119 Type 2 diabetes mellitus without complications: Secondary | ICD-10-CM | POA: Diagnosis not present

## 2017-05-14 DIAGNOSIS — E1129 Type 2 diabetes mellitus with other diabetic kidney complication: Secondary | ICD-10-CM | POA: Diagnosis not present

## 2017-05-14 DIAGNOSIS — T82858A Stenosis of vascular prosthetic devices, implants and grafts, initial encounter: Secondary | ICD-10-CM | POA: Diagnosis not present

## 2017-05-14 DIAGNOSIS — N186 End stage renal disease: Secondary | ICD-10-CM | POA: Diagnosis not present

## 2017-05-14 DIAGNOSIS — Z992 Dependence on renal dialysis: Secondary | ICD-10-CM | POA: Diagnosis not present

## 2017-05-14 DIAGNOSIS — I871 Compression of vein: Secondary | ICD-10-CM | POA: Diagnosis not present

## 2017-05-15 DIAGNOSIS — N186 End stage renal disease: Secondary | ICD-10-CM | POA: Diagnosis not present

## 2017-05-15 DIAGNOSIS — Z23 Encounter for immunization: Secondary | ICD-10-CM | POA: Diagnosis not present

## 2017-05-15 DIAGNOSIS — N2581 Secondary hyperparathyroidism of renal origin: Secondary | ICD-10-CM | POA: Diagnosis not present

## 2017-05-15 DIAGNOSIS — D631 Anemia in chronic kidney disease: Secondary | ICD-10-CM | POA: Diagnosis not present

## 2017-05-15 DIAGNOSIS — E119 Type 2 diabetes mellitus without complications: Secondary | ICD-10-CM | POA: Diagnosis not present

## 2017-05-15 DIAGNOSIS — D509 Iron deficiency anemia, unspecified: Secondary | ICD-10-CM | POA: Diagnosis not present

## 2017-05-17 DIAGNOSIS — N2581 Secondary hyperparathyroidism of renal origin: Secondary | ICD-10-CM | POA: Diagnosis not present

## 2017-05-17 DIAGNOSIS — D631 Anemia in chronic kidney disease: Secondary | ICD-10-CM | POA: Diagnosis not present

## 2017-05-17 DIAGNOSIS — E119 Type 2 diabetes mellitus without complications: Secondary | ICD-10-CM | POA: Diagnosis not present

## 2017-05-17 DIAGNOSIS — D509 Iron deficiency anemia, unspecified: Secondary | ICD-10-CM | POA: Diagnosis not present

## 2017-05-17 DIAGNOSIS — Z23 Encounter for immunization: Secondary | ICD-10-CM | POA: Diagnosis not present

## 2017-05-17 DIAGNOSIS — N186 End stage renal disease: Secondary | ICD-10-CM | POA: Diagnosis not present

## 2017-05-20 DIAGNOSIS — D631 Anemia in chronic kidney disease: Secondary | ICD-10-CM | POA: Diagnosis not present

## 2017-05-20 DIAGNOSIS — D509 Iron deficiency anemia, unspecified: Secondary | ICD-10-CM | POA: Diagnosis not present

## 2017-05-20 DIAGNOSIS — Z23 Encounter for immunization: Secondary | ICD-10-CM | POA: Diagnosis not present

## 2017-05-20 DIAGNOSIS — N2581 Secondary hyperparathyroidism of renal origin: Secondary | ICD-10-CM | POA: Diagnosis not present

## 2017-05-20 DIAGNOSIS — N186 End stage renal disease: Secondary | ICD-10-CM | POA: Diagnosis not present

## 2017-05-20 DIAGNOSIS — E119 Type 2 diabetes mellitus without complications: Secondary | ICD-10-CM | POA: Diagnosis not present

## 2017-05-22 DIAGNOSIS — Z23 Encounter for immunization: Secondary | ICD-10-CM | POA: Diagnosis not present

## 2017-05-22 DIAGNOSIS — D631 Anemia in chronic kidney disease: Secondary | ICD-10-CM | POA: Diagnosis not present

## 2017-05-22 DIAGNOSIS — N2581 Secondary hyperparathyroidism of renal origin: Secondary | ICD-10-CM | POA: Diagnosis not present

## 2017-05-22 DIAGNOSIS — D509 Iron deficiency anemia, unspecified: Secondary | ICD-10-CM | POA: Diagnosis not present

## 2017-05-22 DIAGNOSIS — N186 End stage renal disease: Secondary | ICD-10-CM | POA: Diagnosis not present

## 2017-05-22 DIAGNOSIS — E119 Type 2 diabetes mellitus without complications: Secondary | ICD-10-CM | POA: Diagnosis not present

## 2017-05-24 DIAGNOSIS — N186 End stage renal disease: Secondary | ICD-10-CM | POA: Diagnosis not present

## 2017-05-24 DIAGNOSIS — E119 Type 2 diabetes mellitus without complications: Secondary | ICD-10-CM | POA: Diagnosis not present

## 2017-05-24 DIAGNOSIS — Z23 Encounter for immunization: Secondary | ICD-10-CM | POA: Diagnosis not present

## 2017-05-24 DIAGNOSIS — D509 Iron deficiency anemia, unspecified: Secondary | ICD-10-CM | POA: Diagnosis not present

## 2017-05-24 DIAGNOSIS — D631 Anemia in chronic kidney disease: Secondary | ICD-10-CM | POA: Diagnosis not present

## 2017-05-24 DIAGNOSIS — N2581 Secondary hyperparathyroidism of renal origin: Secondary | ICD-10-CM | POA: Diagnosis not present

## 2017-05-27 DIAGNOSIS — D509 Iron deficiency anemia, unspecified: Secondary | ICD-10-CM | POA: Diagnosis not present

## 2017-05-27 DIAGNOSIS — N186 End stage renal disease: Secondary | ICD-10-CM | POA: Diagnosis not present

## 2017-05-27 DIAGNOSIS — D631 Anemia in chronic kidney disease: Secondary | ICD-10-CM | POA: Diagnosis not present

## 2017-05-27 DIAGNOSIS — Z23 Encounter for immunization: Secondary | ICD-10-CM | POA: Diagnosis not present

## 2017-05-27 DIAGNOSIS — N2581 Secondary hyperparathyroidism of renal origin: Secondary | ICD-10-CM | POA: Diagnosis not present

## 2017-05-27 DIAGNOSIS — E119 Type 2 diabetes mellitus without complications: Secondary | ICD-10-CM | POA: Diagnosis not present

## 2017-05-29 DIAGNOSIS — N2581 Secondary hyperparathyroidism of renal origin: Secondary | ICD-10-CM | POA: Diagnosis not present

## 2017-05-29 DIAGNOSIS — N186 End stage renal disease: Secondary | ICD-10-CM | POA: Diagnosis not present

## 2017-05-29 DIAGNOSIS — Z23 Encounter for immunization: Secondary | ICD-10-CM | POA: Diagnosis not present

## 2017-05-29 DIAGNOSIS — E119 Type 2 diabetes mellitus without complications: Secondary | ICD-10-CM | POA: Diagnosis not present

## 2017-05-29 DIAGNOSIS — D631 Anemia in chronic kidney disease: Secondary | ICD-10-CM | POA: Diagnosis not present

## 2017-05-29 DIAGNOSIS — D509 Iron deficiency anemia, unspecified: Secondary | ICD-10-CM | POA: Diagnosis not present

## 2017-05-31 DIAGNOSIS — D509 Iron deficiency anemia, unspecified: Secondary | ICD-10-CM | POA: Diagnosis not present

## 2017-05-31 DIAGNOSIS — N2581 Secondary hyperparathyroidism of renal origin: Secondary | ICD-10-CM | POA: Diagnosis not present

## 2017-05-31 DIAGNOSIS — E119 Type 2 diabetes mellitus without complications: Secondary | ICD-10-CM | POA: Diagnosis not present

## 2017-05-31 DIAGNOSIS — N186 End stage renal disease: Secondary | ICD-10-CM | POA: Diagnosis not present

## 2017-05-31 DIAGNOSIS — D631 Anemia in chronic kidney disease: Secondary | ICD-10-CM | POA: Diagnosis not present

## 2017-05-31 DIAGNOSIS — Z23 Encounter for immunization: Secondary | ICD-10-CM | POA: Diagnosis not present

## 2017-06-03 DIAGNOSIS — Z23 Encounter for immunization: Secondary | ICD-10-CM | POA: Diagnosis not present

## 2017-06-03 DIAGNOSIS — N2581 Secondary hyperparathyroidism of renal origin: Secondary | ICD-10-CM | POA: Diagnosis not present

## 2017-06-03 DIAGNOSIS — N186 End stage renal disease: Secondary | ICD-10-CM | POA: Diagnosis not present

## 2017-06-03 DIAGNOSIS — E119 Type 2 diabetes mellitus without complications: Secondary | ICD-10-CM | POA: Diagnosis not present

## 2017-06-03 DIAGNOSIS — D631 Anemia in chronic kidney disease: Secondary | ICD-10-CM | POA: Diagnosis not present

## 2017-06-03 DIAGNOSIS — D509 Iron deficiency anemia, unspecified: Secondary | ICD-10-CM | POA: Diagnosis not present

## 2017-06-05 DIAGNOSIS — D631 Anemia in chronic kidney disease: Secondary | ICD-10-CM | POA: Diagnosis not present

## 2017-06-05 DIAGNOSIS — N186 End stage renal disease: Secondary | ICD-10-CM | POA: Diagnosis not present

## 2017-06-05 DIAGNOSIS — E119 Type 2 diabetes mellitus without complications: Secondary | ICD-10-CM | POA: Diagnosis not present

## 2017-06-05 DIAGNOSIS — D509 Iron deficiency anemia, unspecified: Secondary | ICD-10-CM | POA: Diagnosis not present

## 2017-06-05 DIAGNOSIS — N2581 Secondary hyperparathyroidism of renal origin: Secondary | ICD-10-CM | POA: Diagnosis not present

## 2017-06-05 DIAGNOSIS — Z23 Encounter for immunization: Secondary | ICD-10-CM | POA: Diagnosis not present

## 2017-06-07 DIAGNOSIS — N2581 Secondary hyperparathyroidism of renal origin: Secondary | ICD-10-CM | POA: Diagnosis not present

## 2017-06-07 DIAGNOSIS — N186 End stage renal disease: Secondary | ICD-10-CM | POA: Diagnosis not present

## 2017-06-07 DIAGNOSIS — D509 Iron deficiency anemia, unspecified: Secondary | ICD-10-CM | POA: Diagnosis not present

## 2017-06-07 DIAGNOSIS — D631 Anemia in chronic kidney disease: Secondary | ICD-10-CM | POA: Diagnosis not present

## 2017-06-07 DIAGNOSIS — E119 Type 2 diabetes mellitus without complications: Secondary | ICD-10-CM | POA: Diagnosis not present

## 2017-06-07 DIAGNOSIS — Z23 Encounter for immunization: Secondary | ICD-10-CM | POA: Diagnosis not present

## 2017-06-10 DIAGNOSIS — D631 Anemia in chronic kidney disease: Secondary | ICD-10-CM | POA: Diagnosis not present

## 2017-06-10 DIAGNOSIS — E119 Type 2 diabetes mellitus without complications: Secondary | ICD-10-CM | POA: Diagnosis not present

## 2017-06-10 DIAGNOSIS — N2581 Secondary hyperparathyroidism of renal origin: Secondary | ICD-10-CM | POA: Diagnosis not present

## 2017-06-10 DIAGNOSIS — D509 Iron deficiency anemia, unspecified: Secondary | ICD-10-CM | POA: Diagnosis not present

## 2017-06-10 DIAGNOSIS — Z23 Encounter for immunization: Secondary | ICD-10-CM | POA: Diagnosis not present

## 2017-06-10 DIAGNOSIS — N186 End stage renal disease: Secondary | ICD-10-CM | POA: Diagnosis not present

## 2017-06-12 ENCOUNTER — Emergency Department (HOSPITAL_COMMUNITY)
Admission: EM | Admit: 2017-06-12 | Discharge: 2017-06-12 | Disposition: A | Discharge status: Home / Self Care | Payer: Medicare Other | Attending: Emergency Medicine | Admitting: Emergency Medicine

## 2017-06-12 ENCOUNTER — Encounter (HOSPITAL_COMMUNITY): Payer: Self-pay | Admitting: Emergency Medicine

## 2017-06-12 DIAGNOSIS — I6789 Other cerebrovascular disease: Secondary | ICD-10-CM | POA: Diagnosis not present

## 2017-06-12 DIAGNOSIS — J449 Chronic obstructive pulmonary disease, unspecified: Secondary | ICD-10-CM | POA: Diagnosis not present

## 2017-06-12 DIAGNOSIS — I120 Hypertensive chronic kidney disease with stage 5 chronic kidney disease or end stage renal disease: Secondary | ICD-10-CM | POA: Insufficient documentation

## 2017-06-12 DIAGNOSIS — Z8673 Personal history of transient ischemic attack (TIA), and cerebral infarction without residual deficits: Secondary | ICD-10-CM | POA: Insufficient documentation

## 2017-06-12 DIAGNOSIS — N2581 Secondary hyperparathyroidism of renal origin: Secondary | ICD-10-CM | POA: Diagnosis not present

## 2017-06-12 DIAGNOSIS — E119 Type 2 diabetes mellitus without complications: Secondary | ICD-10-CM | POA: Diagnosis not present

## 2017-06-12 DIAGNOSIS — D631 Anemia in chronic kidney disease: Secondary | ICD-10-CM | POA: Diagnosis not present

## 2017-06-12 DIAGNOSIS — Y658 Other specified misadventures during surgical and medical care: Secondary | ICD-10-CM | POA: Diagnosis not present

## 2017-06-12 DIAGNOSIS — I2510 Atherosclerotic heart disease of native coronary artery without angina pectoris: Secondary | ICD-10-CM | POA: Insufficient documentation

## 2017-06-12 DIAGNOSIS — T82838A Hemorrhage of vascular prosthetic devices, implants and grafts, initial encounter: Secondary | ICD-10-CM | POA: Insufficient documentation

## 2017-06-12 DIAGNOSIS — N186 End stage renal disease: Secondary | ICD-10-CM | POA: Diagnosis not present

## 2017-06-12 DIAGNOSIS — Z87891 Personal history of nicotine dependence: Secondary | ICD-10-CM | POA: Diagnosis not present

## 2017-06-12 DIAGNOSIS — E1122 Type 2 diabetes mellitus with diabetic chronic kidney disease: Secondary | ICD-10-CM | POA: Diagnosis not present

## 2017-06-12 DIAGNOSIS — Z794 Long term (current) use of insulin: Secondary | ICD-10-CM | POA: Insufficient documentation

## 2017-06-12 DIAGNOSIS — I5022 Chronic systolic (congestive) heart failure: Secondary | ICD-10-CM | POA: Insufficient documentation

## 2017-06-12 DIAGNOSIS — Z992 Dependence on renal dialysis: Secondary | ICD-10-CM | POA: Diagnosis not present

## 2017-06-12 DIAGNOSIS — Z7982 Long term (current) use of aspirin: Secondary | ICD-10-CM | POA: Insufficient documentation

## 2017-06-12 DIAGNOSIS — Z23 Encounter for immunization: Secondary | ICD-10-CM | POA: Diagnosis not present

## 2017-06-12 DIAGNOSIS — R4781 Slurred speech: Secondary | ICD-10-CM | POA: Diagnosis not present

## 2017-06-12 DIAGNOSIS — Z7902 Long term (current) use of antithrombotics/antiplatelets: Secondary | ICD-10-CM | POA: Diagnosis not present

## 2017-06-12 DIAGNOSIS — D509 Iron deficiency anemia, unspecified: Secondary | ICD-10-CM | POA: Diagnosis not present

## 2017-06-12 NOTE — ED Notes (Signed)
No bleeding present on gauze wrap at this time.

## 2017-06-12 NOTE — ED Notes (Signed)
MD Liu made aware pt still having active bleeding upon reassessment.

## 2017-06-12 NOTE — ED Provider Notes (Signed)
[facility] DEPT Provider Note   CSN: [id number] Arrival date & time: 06/12/17  1245     History   Chief Complaint Chief Complaint  Patient presents with  . Vascular Access Problem    HPI Ernest [name] is a 71 y.o. male.  The history is provided by the patient and medical records.     71 year old male  with history of CHF, COPD, coronary artery disease, end-stage renal disease on hemodialysis, hepatitis C, hyperlipidemia, hypertension, diabetes, prior stroke, presenting to the ED for bleeding from his dialysis fistula.  Patient states he had dialysis today, full treatment without noted complications. After treatment when site was the access he had continued bleeding. They held pressure for about 30 minutes but could not give bleeding to stop. Staff and then applied a pressure clamp and transported patient to the ED. Bleeding controlled on arrival with clamp. Patient has no active complaints. States he has had issues with bleeding from his fistula in the past. He does take aspirin and Plavix.  Past Medical History:  Diagnosis Date  . Abnormal stress test    s/p cath November 2013 with modest disease involving the ostial left main, proximal LAD, proximal RCA - do not appear to be hemodynamically signficant; mild LV dysfunction  . Bacteremia   . Chronic systolic CHF (congestive heart failure) ([place])   . COPD (chronic obstructive pulmonary disease) ([place])   . Coronary artery disease    per cath report 2013  . Ejection fraction < 50%   . Erectile dysfunction    penile implant  . ESRD (end stage renal disease) ([place])    Started HD in [place] in 2009, ESRD was due to DM. Moved to [facility] in Dec 2009 and now gets dialysis at [facility] on a MWF schedule.     . Gout   . Gout    Hx: of  . Hepatitis C   . Hyperlipidemia   . Hypertension   . Obesity   . Retinopathy   . Shortness of breath    Hx: of with exertion  . Stroke ([place])   . Type II or unspecified type diabetes mellitus  without mention of complication, not stated as uncontrolled    adult onset    Patient Active Problem List   Diagnosis Date Noted  . Left leg weakness   . TIA (transient ischemic attack) 10/27/2016  . History of CVA with residual deficit 08/07/2015  . Seizure, late effect of stroke ([place]) 08/07/2015  . Diabetic peripheral neuropathy ([place]) 08/07/2015  . Obstructive sleep apnea 06/16/2013  . Hyperlipidemia associated with type 2 diabetes mellitus ([place])   . ESRD (end stage renal disease) ([place])   . Erectile dysfunction associated with type 2 diabetes mellitus ([place])   . Hypertension due to end stage renal disease caused by type 2 diabetes mellitus, on dialysis ([place])   . COPD (chronic obstructive pulmonary disease) ([place])   . Coronary artery disease   . Chronic combined systolic and diastolic CHF (congestive heart failure) ([place])   . Gout, unspecified 02/28/2009  . Uncontrolled type 2 diabetes mellitus with end-stage renal disease ([place]) 06/08/1984    Past Surgical History:  Procedure Laterality Date  . AV FISTULA PLACEMENT Left 01/13/2013   Procedure: INSERTION OF ARTERIOVENOUS (AV) GORE-TEX GRAFT ARM;  Surgeon: Angelia Mould, MD;  Location: [place];  Service: Vascular;  Laterality: Left;  . AV FISTULA PLACEMENT Left 05/05/2013   Procedure: INSERTION OF LEFT UPPER ARM  ARTERIOVENOUS GORTEX GRAFT;  Surgeon: Angelia Mould, MD;  Location: [place];  Service: Vascular;  Laterality: Left;  . [place] REMOVAL Left 03/26/2013   Procedure: REMOVAL OF  NON INCORPORATED ARTERIOVENOUS GORETEX GRAFT ([place]) left arm * repair of  left brachial artery with vein patch angioplasty.;  Surgeon: Angelia Mould, MD;  Location: [place];  Service: Vascular;  Laterality: Left;  . CARDIAC CATHETERIZATION  August 26, 2012  . CATARACT EXTRACTION W/ INTRAOCULAR LENS  IMPLANT, BILATERAL    . COLONOSCOPY    . EMBOLECTOMY Right 08/27/2013   Procedure: EMBOLECTOMY;  Surgeon: Mal Misty, MD;  Location: [place];   Service: Vascular;  Laterality: Right;  Thrombectomy of Radial and ulnar artery.  [name] EYE SURGERY     laser.  and surgery for DM  . fistula     RUE and wrist  . INSERTION OF DIALYSIS CATHETER Right 01/01/2013   Procedure: INSERTION OF DIALYSIS CATHETER right  internal jugular;  Surgeon: Serafina Mitchell, MD;  Location: [place];  Service: Vascular;  Laterality: Right;  . INSERTION OF DIALYSIS CATHETER N/A 03/26/2013   Procedure: INSERTION OF DIALYSIS CATHETER Left internal jugular vein;  Surgeon: Angelia Mould, MD;  Location: [place];  Service: Vascular;  Laterality: N/A;  . LIGATION OF ARTERIOVENOUS  FISTULA Right 12/30/2013   Procedure: REMOVAL OF SEGMENT OF GORTEX GRAFT AND FISTULA  AND REPAIR OF BRACHIAL ARTERY;  Surgeon: Mal Misty, MD;  Location: [place];  Service: Vascular;  Laterality: Right;  . PENILE PROSTHESIS IMPLANT     1997.. no card  . TEE WITHOUT CARDIOVERSION N/A 06/11/2013   Procedure: TRANSESOPHAGEAL ECHOCARDIOGRAM (TEE);  Surgeon: Larey Dresser, MD;  Location: [place];  Service: Cardiovascular;  Laterality: N/A;  . THROMBECTOMY W/ EMBOLECTOMY Left 03/26/2013   Procedure: Attempted thrombectomy of left arm arteriovenous goretex graft.;  Surgeon: Angelia Mould, MD;  Location: [place];  Service: Vascular;  Laterality: Left;  [name] VENOGRAM N/A 04/07/2013   Procedure: VENOGRAM;  Surgeon: Serafina Mitchell, MD;  Location: [facility] CATH LAB;  Service: Cardiovascular;  Laterality: N/A;       Home Medications    Prior to Admission medications   Medication Sig Start Date End Date Taking? Authorizing Provider  ACCU-CHEK AVIVA PLUS test strip USE ONE STRIP TO CHECK GLUCOSE 4 TIMES DAILY BEFORE  MEALS  AND  BEDTIME. 01/14/17   Rivet, Sindy Guadeloupe, MD  allopurinol (ZYLOPRIM) 100 MG tablet Take 1 tablet (100 mg total) by mouth daily. 04/16/16   Rivet, Sindy Guadeloupe, MD  amitriptyline (ELAVIL) 25 MG tablet TAKE ONE TABLET BY MOUTH AT BEDTIME FOR  NEUROPATHY 04/16/16   Rivet, Sindy Guadeloupe, MD  aspirin EC  81 MG tablet Take 1 tablet (81 mg total) by mouth daily. 11/29/15   Rivet, Sindy Guadeloupe, MD  atorvastatin (LIPITOR) 40 MG tablet Take 1 tablet (40 mg total) by mouth daily. 11/29/15   Rivet, Sindy Guadeloupe, MD  cinacalcet (SENSIPAR) 60 MG tablet Take 1 tablet (60 mg total) by mouth 2 (two) times daily. Patient taking differently: Take 60 mg by mouth daily.  11/29/15   Rivet, Sindy Guadeloupe, MD  clopidogrel (PLAVIX) 75 MG tablet Take 1 tablet (75 mg total) by mouth daily. 11/29/15   Rivet, Sindy Guadeloupe, MD  colchicine 0.6 MG tablet 1 per day on Monday and Friday 04/16/16   Rivet, Sindy Guadeloupe, MD  insulin glargine (LANTUS) 100 UNIT/ML injection Inject 0.1 mLs (10 Units total) into the skin at bedtime. 02/12/17   Rivet,  Sindy Guadeloupe, MD  insulin lispro (HUMALOG) 100 UNIT/ML KiwkPen Inject 0.03 mLs (3 Units total) into the skin 3 (three) times daily with meals. 11/29/15   Rivet, Sindy Guadeloupe, MD  levETIRAcetam (KEPPRA) 500 MG tablet Take 2 tablets (1,000 mg total) by mouth daily. Then take extra 500 mg (1 tablet) on MWF after HD 11/29/15   Rivet, Sindy Guadeloupe, MD  metoprolol tartrate (LOPRESSOR) 25 MG tablet Take 1 tablet (25 mg total) by mouth 2 (two) times daily. Patient taking differently: Take 50 mg by mouth daily.  11/29/15   Rivet, Sindy Guadeloupe, MD  multivitamin (RENA-VIT) TABS tablet Take 1 tablet by mouth daily. 11/29/15   Rivet, Sindy Guadeloupe, MD  sevelamer carbonate (RENVELA) 800 MG tablet Take 1 tablet (800 mg total) by mouth 3 (three) times daily with meals. Patient taking differently: Take 3,200 mg by mouth 3 (three) times daily with meals.  11/29/15   Rivet, Sindy Guadeloupe, MD    Family History Family History  Problem Relation Age of Onset  . Heart disease Father   . Diabetes Sister   . Diabetes Brother   . Diabetes Son     Social History Social History  Substance Use Topics  . Smoking status: Former Smoker    Years: 7.00    Quit date: 06/10/1973  . Smokeless tobacco: Never Used     Comment: Quit in 1974.  [name] Alcohol use No     Allergies     Patient has no known allergies.   Review of Systems Review of Systems  Skin: Positive for wound.  All other systems reviewed and are negative.    Physical Exam Updated Vital Signs BP (!) 146/66   Pulse 75   Temp 97.8 F (36.6 C) (Oral)   Resp 12   Ht 5\' 9"  (1.753 m)   Wt 99.8 kg (220 lb)   SpO2 94%   BMI 32.49 kg/m   Physical Exam  Constitutional: He is oriented to person, place, and time. He appears well-developed and well-nourished.  HENT:  Head: Normocephalic and atraumatic.  Mouth/Throat: Oropharynx is clear and moist.  Eyes: Pupils are equal, round, and reactive to light. Conjunctivae and EOM are normal.  Neck: Normal range of motion.  Cardiovascular: Normal rate, regular rhythm and normal heart sounds.   Pulmonary/Chest: Effort normal and breath sounds normal. No respiratory distress. He has no wheezes.  Abdominal: Soft. Bowel sounds are normal. There is no tenderness. There is no rebound.  Musculoskeletal: Normal range of motion.  Left upper arm with dialysis fistula present, strong thrill; clamp in place-- this was removed and access site began bleeding again  Neurological: He is alert and oriented to person, place, and time.  Skin: Skin is warm and dry.  Psychiatric: He has a normal mood and affect.  Nursing note and vitals reviewed.    ED Treatments / Results  Labs (all labs ordered are listed, but only abnormal results are displayed) Labs Reviewed - No data to display  EKG  EKG Interpretation None       Radiology No results found.  Procedures Procedures (including critical care time)  Medications Ordered in ED Medications - No data to display   Initial Impression / Assessment and Plan / ED Course  I have reviewed the triage vital signs and the nursing notes.  Pertinent labs & imaging results that were available during my care of the patient were reviewed by me and considered in my medical decision making (see chart for  details).  71 year old male here from dialysis center with bleeding from his left upper arm fistula. Had full dialysis session today.  Has had history of bleeding in the past after dialysis.  Currently hemodynamically stable.  1:46 PM  Clamp removed from left arm, bleeding re-started about a minute later.  Area cleansed, quik clot gauze, 4x4 gauze and pressure dressing placed.  Will reassess.  2:13 PM Called to patient's room with concern for bleeding--- there was some blood on dressing, however entire dressing removed and arm examined, no active bleeding at this time.  Gentle wrap applied.  Patient remains hemodynamically stable.  Feel he is appropriate for discharge.  Will have him keep an eye on the bleeding at home.  Can continue regular dialysis scheduled. He understands to return here for any new/worsening symptoms. Patient discharged home in stable condition.  Final Clinical Impressions(s) / ED Diagnoses   Final diagnoses:  Bleeding from dialysis shunt, initial encounter [facility])    New Prescriptions New Prescriptions   No medications on file     Larene Pickett, [name] 06/12/17 1437    Forde Dandy, MD 06/13/17 [phone]

## 2017-06-12 NOTE — ED Triage Notes (Signed)
Pt BIB [facility] from kidney center where pt received full dialysis treatment today having 2L taken off and 500 ml placed back. After treatment, staff unable to stop bleeding at site reporting applying pressure for 30 min. Staff placed pressure clamp to control bleeding. Bleeding controlled upon arrival. Pt has no complaints.

## 2017-06-12 NOTE — ED Notes (Signed)
ED Provider at bedside applying quick clot and wrapping vascular access site.

## 2017-06-12 NOTE — ED Notes (Signed)
Provider used quick clot 4x4 gauze and gauze wrap bleeding developed on gauze. Provider removed wrap and quick clot. No bleeding present. Applied 4x4 gauze and gauze wrap.

## 2017-06-12 NOTE — ED Notes (Signed)
ED Provider at bedside. 

## 2017-06-12 NOTE — Discharge Instructions (Signed)
We are glad we could stop your bleeding today. Keep an eye on it at home. Can continue your regular dialysis scheduled. You can return here for any new concerns.

## 2017-06-14 DIAGNOSIS — N2581 Secondary hyperparathyroidism of renal origin: Secondary | ICD-10-CM | POA: Diagnosis not present

## 2017-06-14 DIAGNOSIS — Z23 Encounter for immunization: Secondary | ICD-10-CM | POA: Diagnosis not present

## 2017-06-14 DIAGNOSIS — N186 End stage renal disease: Secondary | ICD-10-CM | POA: Diagnosis not present

## 2017-06-14 DIAGNOSIS — E1129 Type 2 diabetes mellitus with other diabetic kidney complication: Secondary | ICD-10-CM | POA: Diagnosis not present

## 2017-06-14 DIAGNOSIS — D631 Anemia in chronic kidney disease: Secondary | ICD-10-CM | POA: Diagnosis not present

## 2017-06-14 DIAGNOSIS — E119 Type 2 diabetes mellitus without complications: Secondary | ICD-10-CM | POA: Diagnosis not present

## 2017-06-14 DIAGNOSIS — Z992 Dependence on renal dialysis: Secondary | ICD-10-CM | POA: Diagnosis not present

## 2017-06-14 DIAGNOSIS — D509 Iron deficiency anemia, unspecified: Secondary | ICD-10-CM | POA: Diagnosis not present

## 2017-06-17 DIAGNOSIS — D509 Iron deficiency anemia, unspecified: Secondary | ICD-10-CM | POA: Diagnosis not present

## 2017-06-17 DIAGNOSIS — N2581 Secondary hyperparathyroidism of renal origin: Secondary | ICD-10-CM | POA: Diagnosis not present

## 2017-06-17 DIAGNOSIS — N186 End stage renal disease: Secondary | ICD-10-CM | POA: Diagnosis not present

## 2017-06-17 DIAGNOSIS — E119 Type 2 diabetes mellitus without complications: Secondary | ICD-10-CM | POA: Diagnosis not present

## 2017-06-17 DIAGNOSIS — D631 Anemia in chronic kidney disease: Secondary | ICD-10-CM | POA: Diagnosis not present

## 2017-06-18 ENCOUNTER — Ambulatory Visit: Payer: Medicare Other | Admitting: Neurology

## 2017-06-19 DIAGNOSIS — E119 Type 2 diabetes mellitus without complications: Secondary | ICD-10-CM | POA: Diagnosis not present

## 2017-06-19 DIAGNOSIS — D631 Anemia in chronic kidney disease: Secondary | ICD-10-CM | POA: Diagnosis not present

## 2017-06-19 DIAGNOSIS — N186 End stage renal disease: Secondary | ICD-10-CM | POA: Diagnosis not present

## 2017-06-19 DIAGNOSIS — N2581 Secondary hyperparathyroidism of renal origin: Secondary | ICD-10-CM | POA: Diagnosis not present

## 2017-06-19 DIAGNOSIS — D509 Iron deficiency anemia, unspecified: Secondary | ICD-10-CM | POA: Diagnosis not present

## 2017-06-21 DIAGNOSIS — E119 Type 2 diabetes mellitus without complications: Secondary | ICD-10-CM | POA: Diagnosis not present

## 2017-06-21 DIAGNOSIS — D631 Anemia in chronic kidney disease: Secondary | ICD-10-CM | POA: Diagnosis not present

## 2017-06-21 DIAGNOSIS — N2581 Secondary hyperparathyroidism of renal origin: Secondary | ICD-10-CM | POA: Diagnosis not present

## 2017-06-21 DIAGNOSIS — N186 End stage renal disease: Secondary | ICD-10-CM | POA: Diagnosis not present

## 2017-06-21 DIAGNOSIS — D509 Iron deficiency anemia, unspecified: Secondary | ICD-10-CM | POA: Diagnosis not present

## 2017-06-24 DIAGNOSIS — N186 End stage renal disease: Secondary | ICD-10-CM | POA: Diagnosis not present

## 2017-06-24 DIAGNOSIS — N2581 Secondary hyperparathyroidism of renal origin: Secondary | ICD-10-CM | POA: Diagnosis not present

## 2017-06-24 DIAGNOSIS — D509 Iron deficiency anemia, unspecified: Secondary | ICD-10-CM | POA: Diagnosis not present

## 2017-06-24 DIAGNOSIS — E119 Type 2 diabetes mellitus without complications: Secondary | ICD-10-CM | POA: Diagnosis not present

## 2017-06-24 DIAGNOSIS — D631 Anemia in chronic kidney disease: Secondary | ICD-10-CM | POA: Diagnosis not present

## 2017-06-25 ENCOUNTER — Ambulatory Visit: Payer: Medicare Other | Admitting: Neurology

## 2017-06-25 DIAGNOSIS — Z029 Encounter for administrative examinations, unspecified: Secondary | ICD-10-CM

## 2017-06-26 ENCOUNTER — Ambulatory Visit: Payer: Medicare Other | Admitting: Podiatry

## 2017-06-26 DIAGNOSIS — D631 Anemia in chronic kidney disease: Secondary | ICD-10-CM | POA: Diagnosis not present

## 2017-06-26 DIAGNOSIS — D509 Iron deficiency anemia, unspecified: Secondary | ICD-10-CM | POA: Diagnosis not present

## 2017-06-26 DIAGNOSIS — E119 Type 2 diabetes mellitus without complications: Secondary | ICD-10-CM | POA: Diagnosis not present

## 2017-06-26 DIAGNOSIS — N2581 Secondary hyperparathyroidism of renal origin: Secondary | ICD-10-CM | POA: Diagnosis not present

## 2017-06-26 DIAGNOSIS — N186 End stage renal disease: Secondary | ICD-10-CM | POA: Diagnosis not present

## 2017-06-28 DIAGNOSIS — D509 Iron deficiency anemia, unspecified: Secondary | ICD-10-CM | POA: Diagnosis not present

## 2017-06-28 DIAGNOSIS — E119 Type 2 diabetes mellitus without complications: Secondary | ICD-10-CM | POA: Diagnosis not present

## 2017-06-28 DIAGNOSIS — N186 End stage renal disease: Secondary | ICD-10-CM | POA: Diagnosis not present

## 2017-06-28 DIAGNOSIS — D631 Anemia in chronic kidney disease: Secondary | ICD-10-CM | POA: Diagnosis not present

## 2017-06-28 DIAGNOSIS — N2581 Secondary hyperparathyroidism of renal origin: Secondary | ICD-10-CM | POA: Diagnosis not present

## 2017-07-01 DIAGNOSIS — N186 End stage renal disease: Secondary | ICD-10-CM | POA: Diagnosis not present

## 2017-07-01 DIAGNOSIS — D509 Iron deficiency anemia, unspecified: Secondary | ICD-10-CM | POA: Diagnosis not present

## 2017-07-01 DIAGNOSIS — E119 Type 2 diabetes mellitus without complications: Secondary | ICD-10-CM | POA: Diagnosis not present

## 2017-07-01 DIAGNOSIS — N2581 Secondary hyperparathyroidism of renal origin: Secondary | ICD-10-CM | POA: Diagnosis not present

## 2017-07-01 DIAGNOSIS — D631 Anemia in chronic kidney disease: Secondary | ICD-10-CM | POA: Diagnosis not present

## 2017-07-02 ENCOUNTER — Encounter: Payer: Self-pay | Admitting: Neurology

## 2017-07-03 DIAGNOSIS — E119 Type 2 diabetes mellitus without complications: Secondary | ICD-10-CM | POA: Diagnosis not present

## 2017-07-03 DIAGNOSIS — N186 End stage renal disease: Secondary | ICD-10-CM | POA: Diagnosis not present

## 2017-07-03 DIAGNOSIS — D631 Anemia in chronic kidney disease: Secondary | ICD-10-CM | POA: Diagnosis not present

## 2017-07-03 DIAGNOSIS — D509 Iron deficiency anemia, unspecified: Secondary | ICD-10-CM | POA: Diagnosis not present

## 2017-07-03 DIAGNOSIS — N2581 Secondary hyperparathyroidism of renal origin: Secondary | ICD-10-CM | POA: Diagnosis not present

## 2017-07-05 DIAGNOSIS — D509 Iron deficiency anemia, unspecified: Secondary | ICD-10-CM | POA: Diagnosis not present

## 2017-07-05 DIAGNOSIS — N2581 Secondary hyperparathyroidism of renal origin: Secondary | ICD-10-CM | POA: Diagnosis not present

## 2017-07-05 DIAGNOSIS — D631 Anemia in chronic kidney disease: Secondary | ICD-10-CM | POA: Diagnosis not present

## 2017-07-05 DIAGNOSIS — N186 End stage renal disease: Secondary | ICD-10-CM | POA: Diagnosis not present

## 2017-07-05 DIAGNOSIS — E119 Type 2 diabetes mellitus without complications: Secondary | ICD-10-CM | POA: Diagnosis not present

## 2017-07-08 DIAGNOSIS — D509 Iron deficiency anemia, unspecified: Secondary | ICD-10-CM | POA: Diagnosis not present

## 2017-07-08 DIAGNOSIS — E119 Type 2 diabetes mellitus without complications: Secondary | ICD-10-CM | POA: Diagnosis not present

## 2017-07-08 DIAGNOSIS — N186 End stage renal disease: Secondary | ICD-10-CM | POA: Diagnosis not present

## 2017-07-08 DIAGNOSIS — D631 Anemia in chronic kidney disease: Secondary | ICD-10-CM | POA: Diagnosis not present

## 2017-07-08 DIAGNOSIS — N2581 Secondary hyperparathyroidism of renal origin: Secondary | ICD-10-CM | POA: Diagnosis not present

## 2017-07-10 DIAGNOSIS — E119 Type 2 diabetes mellitus without complications: Secondary | ICD-10-CM | POA: Diagnosis not present

## 2017-07-10 DIAGNOSIS — D631 Anemia in chronic kidney disease: Secondary | ICD-10-CM | POA: Diagnosis not present

## 2017-07-10 DIAGNOSIS — N2581 Secondary hyperparathyroidism of renal origin: Secondary | ICD-10-CM | POA: Diagnosis not present

## 2017-07-10 DIAGNOSIS — N186 End stage renal disease: Secondary | ICD-10-CM | POA: Diagnosis not present

## 2017-07-10 DIAGNOSIS — D509 Iron deficiency anemia, unspecified: Secondary | ICD-10-CM | POA: Diagnosis not present

## 2017-07-12 DIAGNOSIS — D509 Iron deficiency anemia, unspecified: Secondary | ICD-10-CM | POA: Diagnosis not present

## 2017-07-12 DIAGNOSIS — D631 Anemia in chronic kidney disease: Secondary | ICD-10-CM | POA: Diagnosis not present

## 2017-07-12 DIAGNOSIS — E119 Type 2 diabetes mellitus without complications: Secondary | ICD-10-CM | POA: Diagnosis not present

## 2017-07-12 DIAGNOSIS — N186 End stage renal disease: Secondary | ICD-10-CM | POA: Diagnosis not present

## 2017-07-12 DIAGNOSIS — N2581 Secondary hyperparathyroidism of renal origin: Secondary | ICD-10-CM | POA: Diagnosis not present

## 2017-07-14 DIAGNOSIS — Z992 Dependence on renal dialysis: Secondary | ICD-10-CM | POA: Diagnosis not present

## 2017-07-14 DIAGNOSIS — N186 End stage renal disease: Secondary | ICD-10-CM | POA: Diagnosis not present

## 2017-07-14 DIAGNOSIS — E1129 Type 2 diabetes mellitus with other diabetic kidney complication: Secondary | ICD-10-CM | POA: Diagnosis not present

## 2017-07-15 DIAGNOSIS — D631 Anemia in chronic kidney disease: Secondary | ICD-10-CM | POA: Diagnosis not present

## 2017-07-15 DIAGNOSIS — D509 Iron deficiency anemia, unspecified: Secondary | ICD-10-CM | POA: Diagnosis not present

## 2017-07-15 DIAGNOSIS — N2581 Secondary hyperparathyroidism of renal origin: Secondary | ICD-10-CM | POA: Diagnosis not present

## 2017-07-15 DIAGNOSIS — N186 End stage renal disease: Secondary | ICD-10-CM | POA: Diagnosis not present

## 2017-07-15 DIAGNOSIS — E119 Type 2 diabetes mellitus without complications: Secondary | ICD-10-CM | POA: Diagnosis not present

## 2017-07-17 DIAGNOSIS — D509 Iron deficiency anemia, unspecified: Secondary | ICD-10-CM | POA: Diagnosis not present

## 2017-07-17 DIAGNOSIS — N2581 Secondary hyperparathyroidism of renal origin: Secondary | ICD-10-CM | POA: Diagnosis not present

## 2017-07-17 DIAGNOSIS — E119 Type 2 diabetes mellitus without complications: Secondary | ICD-10-CM | POA: Diagnosis not present

## 2017-07-17 DIAGNOSIS — N186 End stage renal disease: Secondary | ICD-10-CM | POA: Diagnosis not present

## 2017-07-17 DIAGNOSIS — D631 Anemia in chronic kidney disease: Secondary | ICD-10-CM | POA: Diagnosis not present

## 2017-07-18 ENCOUNTER — Ambulatory Visit (INDEPENDENT_AMBULATORY_CARE_PROVIDER_SITE_OTHER): Payer: Medicare Other | Admitting: Neurology

## 2017-07-18 ENCOUNTER — Encounter: Payer: Self-pay | Admitting: Neurology

## 2017-07-18 VITALS — BP 118/54 | HR 60 | Resp 20 | Ht 69.0 in | Wt 213.4 lb

## 2017-07-18 DIAGNOSIS — G40209 Localization-related (focal) (partial) symptomatic epilepsy and epileptic syndromes with complex partial seizures, not intractable, without status epilepticus: Secondary | ICD-10-CM | POA: Diagnosis not present

## 2017-07-18 DIAGNOSIS — H4720 Unspecified optic atrophy: Secondary | ICD-10-CM | POA: Diagnosis not present

## 2017-07-18 DIAGNOSIS — I639 Cerebral infarction, unspecified: Secondary | ICD-10-CM

## 2017-07-18 DIAGNOSIS — E785 Hyperlipidemia, unspecified: Secondary | ICD-10-CM | POA: Diagnosis not present

## 2017-07-18 DIAGNOSIS — I10 Essential (primary) hypertension: Secondary | ICD-10-CM | POA: Diagnosis not present

## 2017-07-18 DIAGNOSIS — H3412 Central retinal artery occlusion, left eye: Secondary | ICD-10-CM | POA: Diagnosis not present

## 2017-07-18 DIAGNOSIS — E113511 Type 2 diabetes mellitus with proliferative diabetic retinopathy with macular edema, right eye: Secondary | ICD-10-CM | POA: Diagnosis not present

## 2017-07-18 DIAGNOSIS — N186 End stage renal disease: Secondary | ICD-10-CM

## 2017-07-18 DIAGNOSIS — E1122 Type 2 diabetes mellitus with diabetic chronic kidney disease: Secondary | ICD-10-CM | POA: Diagnosis not present

## 2017-07-18 DIAGNOSIS — E1165 Type 2 diabetes mellitus with hyperglycemia: Secondary | ICD-10-CM

## 2017-07-18 DIAGNOSIS — E113512 Type 2 diabetes mellitus with proliferative diabetic retinopathy with macular edema, left eye: Secondary | ICD-10-CM | POA: Diagnosis not present

## 2017-07-18 DIAGNOSIS — IMO0002 Reserved for concepts with insufficient information to code with codable children: Secondary | ICD-10-CM

## 2017-07-18 NOTE — Progress Notes (Signed)
NEUROLOGY FOLLOW UP OFFICE NOTE  Ernest [name] [id number]  HISTORY OF PRESENT ILLNESS: Ernest [name] is a 71 year old right-handed man with hyperlipidemia, ESRD on HD (M/W/F), type II diabetes mellitus, hypertension, COPD, gout, chronic CHF, coronary artery disease, hepatitis C, penile implant (unable to get MRI) and history of stroke who follows up for stroke and seizures.     UPDATE: He is taking Keppra 1000mg  daily and 500mg  following dialysis.  No recent seizures.  He is on dual antiplatelet therapy.  Back in June, he says he may have had a TIA, presenting as 3 days of left leg numbness.  He didn't seek medical attention at the time.   HISTORY: Around October 2015, he developed slurred speech.  He also experienced ringing in his right ear, with sensation of water in his right ear.  He did not seek medical attention at the time.  There was no facial droop or difficulty walking or swallowing.  He decided to go to the ED on 09/21/14 for an evaluation as the slurred speech and tinnitus persisted.  He denied weakness but ED exam revealed right arm weakness.  CT of the head was performed, which revealed chronic small vessel ischemic changes, including remote right cerebellar, left pontine and right basal ganglia lacunar infarcts.  No acute infarction was seen.     On 04/25/15, he developed right hand cramping and dystonia.  When EMS arrived, he had jerking of the right hand that then generalized to a tonic-clonic seizure.  He had two other seizures, in the ambulance and in the ED, lasting 30-40 seconds.  CT of the head showed chronic small vessel disease and moderate brain atrophy.  He was started on Keppra 500mg  twice daily.    He was admitted to [facility] on 04/29/15 for seizure, which was thought to be secondary to hyperglycemia with a blood glucose level of 662.  MRI of the brain was withheld because he had a penile implant that contains metal.  Keppra was increased to 1000mg  daily with 500mg   following each dialysis.   He was re-admitted to [facility] again on 07/11/15 for presumed acute right pontine infarct.  He presented with diplopia and unsteady gait.  On exam, he was found to have one and a half syndrome.  CT of head and repeat CT of head did not reveal acute infarct. 2D echo showed EF 45-50% with grade 1 diastolic dysfunction.  Carotid doppler showed no hemodynamically significant ICA stenosis   He was admitted to [facility] from 10/27/16 to 10/29/16 for 2 to 3 days of persistent left leg weakness.  He did not have other associated symptoms, such as other extremity weakness, numbness, visual disturbance, unsteady gait or worsening of residual dysarthria.  Serum glucose was 263.  CT of head was personally reviewed and was negative for acute findings.  Unable to obtain MRI due to metal penile implant, so repeat head the CT on the following day was also negative.  Carotid doppler was negative for hemodynamically significant ICA stenosis.  LDL was 50.  Hgb A1c was elevated at 8.7.  Weakness was thought to be due to hypotension during HD.  He was advised to stop Lisinopril but otherwise continue is current regimen with dual antiplatelet therapy.  PAST MEDICAL HISTORY: Past Medical History:  Diagnosis Date  . Abnormal stress test    s/p cath November 2013 with modest disease involving the ostial left main, proximal LAD, proximal RCA - do not appear to be  hemodynamically signficant; mild LV dysfunction  . Bacteremia   . Chronic systolic CHF (congestive heart failure) ([place])   . COPD (chronic obstructive pulmonary disease) ([place])   . Coronary artery disease    per cath report 2013  . Ejection fraction < 50%   . Erectile dysfunction    penile implant  . ESRD (end stage renal disease) ([place])    Started HD in [place] in 2009, ESRD was due to DM. Moved to [facility] in Dec 2009 and now gets dialysis at [facility] on a MWF schedule.     . Gout   . Gout    Hx: of  . Hepatitis C   . Hyperlipidemia     . Hypertension   . Obesity   . Retinopathy   . Shortness of breath    Hx: of with exertion  . Stroke ([place])   . Type II or unspecified type diabetes mellitus without mention of complication, not stated as uncontrolled    adult onset    MEDICATIONS: Current Outpatient Prescriptions on File Prior to Visit  Medication Sig Dispense Refill  . ACCU-CHEK AVIVA PLUS test strip USE ONE STRIP TO CHECK GLUCOSE 4 TIMES DAILY BEFORE  MEALS  AND  BEDTIME. 100 each 14  . allopurinol (ZYLOPRIM) 100 MG tablet Take 1 tablet (100 mg total) by mouth daily. 30 tablet 2  . amitriptyline (ELAVIL) 25 MG tablet TAKE ONE TABLET BY MOUTH AT BEDTIME FOR  NEUROPATHY 90 tablet 1  . aspirin EC 81 MG tablet Take 1 tablet (81 mg total) by mouth daily. 30 tablet 11  . atorvastatin (LIPITOR) 40 MG tablet Take 1 tablet (40 mg total) by mouth daily. 30 tablet 11  . cinacalcet (SENSIPAR) 60 MG tablet Take 1 tablet (60 mg total) by mouth 2 (two) times daily. (Patient taking differently: Take 60 mg by mouth daily. ) 60 tablet 5  . clopidogrel (PLAVIX) 75 MG tablet Take 1 tablet (75 mg total) by mouth daily. 30 tablet 11  . colchicine 0.6 MG tablet 1 per day on Monday and Friday 30 tablet 2  . insulin glargine (LANTUS) 100 UNIT/ML injection Inject 0.1 mLs (10 Units total) into the skin at bedtime. 10 mL 1  . insulin lispro (HUMALOG) 100 UNIT/ML KiwkPen Inject 0.03 mLs (3 Units total) into the skin 3 (three) times daily with meals. 15 mL 5  . levETIRAcetam (KEPPRA) 500 MG tablet Take 2 tablets (1,000 mg total) by mouth daily. Then take extra 500 mg (1 tablet) on MWF after HD 72 tablet 5  . metoprolol tartrate (LOPRESSOR) 25 MG tablet Take 1 tablet (25 mg total) by mouth 2 (two) times daily. (Patient taking differently: Take 50 mg by mouth daily. ) 60 tablet 5  . metoprolol tartrate (LOPRESSOR) 50 MG tablet Take 50 mg by mouth 2 (two) times daily.    . multivitamin (RENA-VIT) TABS tablet Take 1 tablet by mouth daily. 30 tablet 11   . sevelamer carbonate (RENVELA) 800 MG tablet Take 1 tablet (800 mg total) by mouth 3 (three) times daily with meals. (Patient taking differently: Take 3,200 mg by mouth 3 (three) times daily with meals. ) 90 tablet 1  . sevelamer carbonate (RENVELA) 800 MG tablet Take 2,400 mg by mouth 3 (three) times daily with meals.     No current facility-administered medications on file prior to visit.     ALLERGIES: [name]  FAMILY HISTORY: Family History  Problem Relation Age of Onset  . Heart  disease Father   . Diabetes Sister   . Diabetes Brother   . Diabetes Son     SOCIAL HISTORY: Social History   Social History  . Marital status: Married    Spouse name: N/A  . Number of children: N/A  . Years of education: N/A   Occupational History  . [employer] under[employer] Disabled    disabled   Social History Main Topics  . Smoking status: Former Smoker    Years: 7.00    Quit date: 06/10/1973  . Smokeless tobacco: Never Used     Comment: Quit in 1974.  [name] Alcohol use No  . Drug use: No  . Sexual activity: Not on file   Other Topics Concern  . Not on file   Social History Narrative   Adopted mentally retarded child at home, 2 adopted children, 3 living and 2 deceased.    REVIEW OF SYSTEMS: Constitutional: No fevers, chills, or sweats, no generalized fatigue, change in appetite Eyes: No visual changes, double vision, eye pain Ear, nose and throat: No hearing loss, ear pain, nasal congestion, sore throat Cardiovascular: No chest pain, palpitations Respiratory:  No shortness of breath at rest or with exertion, wheezes GastrointestinaI: No nausea, vomiting, diarrhea, abdominal pain, fecal incontinence Genitourinary:  No dysuria, urinary retention or frequency Musculoskeletal:  No neck pain, back pain Integumentary: No rash, pruritus, skin lesions Neurological: as above Psychiatric: No depression, insomnia, anxiety Endocrine: No palpitations, fatigue, diaphoresis, mood swings,  change in appetite, change in weight, increased thirst Hematologic/Lymphatic:  No purpura, petechiae. Allergic/Immunologic: no itchy/runny eyes, nasal congestion, recent allergic reactions, rashes  PHYSICAL EXAM: Vitals:   07/18/17 1437  BP: (!) 118/54  Pulse: 60  Resp: 20   General: No acute distress.   Head:  Normocephalic/atraumatic Eyes:  Fundi examined but not visualized Neck: supple, no paraspinal tenderness, full range of motion Heart:  Regular rate and rhythm Lungs:  Clear to auscultation bilaterally Back: No paraspinal tenderness Neurological Exam: alert and oriented to person, place, and time. Attention span and concentration intact, recent and remote memory intact, fund of knowledge intact.  Speech fluent and not dysarthric, language intact.  CN II-XII intact. Bulk and tone normal, muscle strength 5/5 throughout.  Sensation to light touch  intact.  Deep tendon reflexes absent throughout.  Finger to nose testing intact.  Gait wide-based  IMPRESSION: CVA Symptomatic localization related seizure with secondary generalization Uncontrolled type 2 diabetes Hyperlipidemia Hypertension  PLAN: 1.  Keppra 1000mg  twice daily with 500mg  following dialysis 2.  ASA 81mg  and Plavix 75mg  daily for secondary stroke prevention 3.  Continue statin therapy as per PCP (LDL goal less than 70) 4.  Maintain blood pressure control as per PCP 5.  Advised that he should go to the ED if he feels that he may be having a stroke. 6.  Optimize glycemic control as per PCP 7.  Follow up in 9 months.  25 minutes spent face to face with patient, over 50% spent discussing management.  Metta Clines, DO  CC:  Albin Felling, MD

## 2017-07-18 NOTE — Patient Instructions (Addendum)
1.  Continue aspirin 81mg  daily and Plavix 75mg  daily 2.  Continue levetiracetam 1000mg  twice daily and 500mg  after dialysis 3.  Continue atorvastatin for cholesterol 4.  Continue blood pressure control 5.  Follow up in 9 months

## 2017-07-19 DIAGNOSIS — D631 Anemia in chronic kidney disease: Secondary | ICD-10-CM | POA: Diagnosis not present

## 2017-07-19 DIAGNOSIS — N2581 Secondary hyperparathyroidism of renal origin: Secondary | ICD-10-CM | POA: Diagnosis not present

## 2017-07-19 DIAGNOSIS — N186 End stage renal disease: Secondary | ICD-10-CM | POA: Diagnosis not present

## 2017-07-19 DIAGNOSIS — D509 Iron deficiency anemia, unspecified: Secondary | ICD-10-CM | POA: Diagnosis not present

## 2017-07-19 DIAGNOSIS — E119 Type 2 diabetes mellitus without complications: Secondary | ICD-10-CM | POA: Diagnosis not present

## 2017-07-22 DIAGNOSIS — N186 End stage renal disease: Secondary | ICD-10-CM | POA: Diagnosis not present

## 2017-07-22 DIAGNOSIS — N2581 Secondary hyperparathyroidism of renal origin: Secondary | ICD-10-CM | POA: Diagnosis not present

## 2017-07-22 DIAGNOSIS — E119 Type 2 diabetes mellitus without complications: Secondary | ICD-10-CM | POA: Diagnosis not present

## 2017-07-22 DIAGNOSIS — D631 Anemia in chronic kidney disease: Secondary | ICD-10-CM | POA: Diagnosis not present

## 2017-07-22 DIAGNOSIS — D509 Iron deficiency anemia, unspecified: Secondary | ICD-10-CM | POA: Diagnosis not present

## 2017-07-24 DIAGNOSIS — Z992 Dependence on renal dialysis: Secondary | ICD-10-CM | POA: Diagnosis not present

## 2017-07-24 DIAGNOSIS — N2581 Secondary hyperparathyroidism of renal origin: Secondary | ICD-10-CM | POA: Diagnosis not present

## 2017-07-24 DIAGNOSIS — N186 End stage renal disease: Secondary | ICD-10-CM | POA: Diagnosis not present

## 2017-07-24 DIAGNOSIS — D509 Iron deficiency anemia, unspecified: Secondary | ICD-10-CM | POA: Diagnosis not present

## 2017-07-24 DIAGNOSIS — D631 Anemia in chronic kidney disease: Secondary | ICD-10-CM | POA: Diagnosis not present

## 2017-07-24 DIAGNOSIS — I871 Compression of vein: Secondary | ICD-10-CM | POA: Diagnosis not present

## 2017-07-24 DIAGNOSIS — T82858A Stenosis of vascular prosthetic devices, implants and grafts, initial encounter: Secondary | ICD-10-CM | POA: Diagnosis not present

## 2017-07-24 DIAGNOSIS — E119 Type 2 diabetes mellitus without complications: Secondary | ICD-10-CM | POA: Diagnosis not present

## 2017-07-26 DIAGNOSIS — N2581 Secondary hyperparathyroidism of renal origin: Secondary | ICD-10-CM | POA: Diagnosis not present

## 2017-07-26 DIAGNOSIS — E119 Type 2 diabetes mellitus without complications: Secondary | ICD-10-CM | POA: Diagnosis not present

## 2017-07-26 DIAGNOSIS — N186 End stage renal disease: Secondary | ICD-10-CM | POA: Diagnosis not present

## 2017-07-26 DIAGNOSIS — D509 Iron deficiency anemia, unspecified: Secondary | ICD-10-CM | POA: Diagnosis not present

## 2017-07-26 DIAGNOSIS — D631 Anemia in chronic kidney disease: Secondary | ICD-10-CM | POA: Diagnosis not present

## 2017-07-29 DIAGNOSIS — E119 Type 2 diabetes mellitus without complications: Secondary | ICD-10-CM | POA: Diagnosis not present

## 2017-07-29 DIAGNOSIS — D631 Anemia in chronic kidney disease: Secondary | ICD-10-CM | POA: Diagnosis not present

## 2017-07-29 DIAGNOSIS — D509 Iron deficiency anemia, unspecified: Secondary | ICD-10-CM | POA: Diagnosis not present

## 2017-07-29 DIAGNOSIS — N186 End stage renal disease: Secondary | ICD-10-CM | POA: Diagnosis not present

## 2017-07-29 DIAGNOSIS — N2581 Secondary hyperparathyroidism of renal origin: Secondary | ICD-10-CM | POA: Diagnosis not present

## 2017-07-31 ENCOUNTER — Encounter: Payer: Self-pay | Admitting: Podiatry

## 2017-07-31 ENCOUNTER — Ambulatory Visit (INDEPENDENT_AMBULATORY_CARE_PROVIDER_SITE_OTHER): Payer: Medicare Other | Admitting: Podiatry

## 2017-07-31 DIAGNOSIS — D509 Iron deficiency anemia, unspecified: Secondary | ICD-10-CM | POA: Diagnosis not present

## 2017-07-31 DIAGNOSIS — D631 Anemia in chronic kidney disease: Secondary | ICD-10-CM | POA: Diagnosis not present

## 2017-07-31 DIAGNOSIS — N186 End stage renal disease: Secondary | ICD-10-CM | POA: Diagnosis not present

## 2017-07-31 DIAGNOSIS — M79676 Pain in unspecified toe(s): Secondary | ICD-10-CM

## 2017-07-31 DIAGNOSIS — E1142 Type 2 diabetes mellitus with diabetic polyneuropathy: Secondary | ICD-10-CM

## 2017-07-31 DIAGNOSIS — E119 Type 2 diabetes mellitus without complications: Secondary | ICD-10-CM | POA: Diagnosis not present

## 2017-07-31 DIAGNOSIS — N2581 Secondary hyperparathyroidism of renal origin: Secondary | ICD-10-CM | POA: Diagnosis not present

## 2017-07-31 DIAGNOSIS — B351 Tinea unguium: Secondary | ICD-10-CM

## 2017-07-31 NOTE — Progress Notes (Signed)
Patient ID: Ernest [name], male   DOB: 1946-06-21, 71 y.o.   MRN: [id number] Complaint:  Visit Type: Patient returns to my office for continued preventative foot care services. Complaint: Patient states" my nails have grown long and thick and become painful to walk and wear shoes" Patient has been diagnosed with DM with neuropathy. He presents for preventative foot care services. No changes to ROS  Podiatric Exam: Vascular: dorsalis pedis and posterior tibial pulses are palpable bilateral. Capillary return is immediate. Temperature gradient is WNL. Skin turgor WNL  Sensorium: Diminished n Semmes Weinstein monofilament test. Normal tactile sensation bilaterally. Nail Exam: Pt has thick disfigured discolored nails with subungual debris noted bilateral entire nail hallux through fifth toenails Ulcer Exam: There is no evidence of ulcer or pre-ulcerative changes or infection. Orthopedic Exam: Muscle tone and strength are WNL. No limitations in general ROM. No crepitus or effusions noted. Foot type and digits show no abnormalities. Hallux malleus noted. Mild HAV  B/L. Skin: No Porokeratosis. No infection or ulcers  Diagnosis:  Tinea unguium, Pain in right toe, pain in left toes  Treatment & Plan Procedures and Treatment: Consent by patient was obtained for treatment procedures. The patient understood the discussion of treatment and procedures well. All questions were answered thoroughly reviewed. Debridement of mycotic and hypertrophic toenails, 1 through 5 bilateral and clearing of subungual debris. No ulceration, no infection noted.  Return Visit-Office Procedure: Patient instructed to return to the office for a follow up visit 3 months for continued evaluation and treatment.   Gardiner Barefoot DPM

## 2017-08-02 DIAGNOSIS — D509 Iron deficiency anemia, unspecified: Secondary | ICD-10-CM | POA: Diagnosis not present

## 2017-08-02 DIAGNOSIS — E119 Type 2 diabetes mellitus without complications: Secondary | ICD-10-CM | POA: Diagnosis not present

## 2017-08-02 DIAGNOSIS — D631 Anemia in chronic kidney disease: Secondary | ICD-10-CM | POA: Diagnosis not present

## 2017-08-02 DIAGNOSIS — N2581 Secondary hyperparathyroidism of renal origin: Secondary | ICD-10-CM | POA: Diagnosis not present

## 2017-08-02 DIAGNOSIS — N186 End stage renal disease: Secondary | ICD-10-CM | POA: Diagnosis not present

## 2017-08-05 DIAGNOSIS — D631 Anemia in chronic kidney disease: Secondary | ICD-10-CM | POA: Diagnosis not present

## 2017-08-05 DIAGNOSIS — N2581 Secondary hyperparathyroidism of renal origin: Secondary | ICD-10-CM | POA: Diagnosis not present

## 2017-08-05 DIAGNOSIS — E119 Type 2 diabetes mellitus without complications: Secondary | ICD-10-CM | POA: Diagnosis not present

## 2017-08-05 DIAGNOSIS — N186 End stage renal disease: Secondary | ICD-10-CM | POA: Diagnosis not present

## 2017-08-05 DIAGNOSIS — D509 Iron deficiency anemia, unspecified: Secondary | ICD-10-CM | POA: Diagnosis not present

## 2017-08-07 DIAGNOSIS — N186 End stage renal disease: Secondary | ICD-10-CM | POA: Diagnosis not present

## 2017-08-07 DIAGNOSIS — E1129 Type 2 diabetes mellitus with other diabetic kidney complication: Secondary | ICD-10-CM | POA: Diagnosis not present

## 2017-08-07 DIAGNOSIS — E119 Type 2 diabetes mellitus without complications: Secondary | ICD-10-CM | POA: Diagnosis not present

## 2017-08-07 DIAGNOSIS — N2581 Secondary hyperparathyroidism of renal origin: Secondary | ICD-10-CM | POA: Diagnosis not present

## 2017-08-07 DIAGNOSIS — D509 Iron deficiency anemia, unspecified: Secondary | ICD-10-CM | POA: Diagnosis not present

## 2017-08-07 DIAGNOSIS — D631 Anemia in chronic kidney disease: Secondary | ICD-10-CM | POA: Diagnosis not present

## 2017-08-09 DIAGNOSIS — N2581 Secondary hyperparathyroidism of renal origin: Secondary | ICD-10-CM | POA: Diagnosis not present

## 2017-08-09 DIAGNOSIS — D631 Anemia in chronic kidney disease: Secondary | ICD-10-CM | POA: Diagnosis not present

## 2017-08-09 DIAGNOSIS — D509 Iron deficiency anemia, unspecified: Secondary | ICD-10-CM | POA: Diagnosis not present

## 2017-08-09 DIAGNOSIS — N186 End stage renal disease: Secondary | ICD-10-CM | POA: Diagnosis not present

## 2017-08-09 DIAGNOSIS — E119 Type 2 diabetes mellitus without complications: Secondary | ICD-10-CM | POA: Diagnosis not present

## 2017-08-12 DIAGNOSIS — N186 End stage renal disease: Secondary | ICD-10-CM | POA: Diagnosis not present

## 2017-08-12 DIAGNOSIS — D509 Iron deficiency anemia, unspecified: Secondary | ICD-10-CM | POA: Diagnosis not present

## 2017-08-12 DIAGNOSIS — N2581 Secondary hyperparathyroidism of renal origin: Secondary | ICD-10-CM | POA: Diagnosis not present

## 2017-08-12 DIAGNOSIS — D631 Anemia in chronic kidney disease: Secondary | ICD-10-CM | POA: Diagnosis not present

## 2017-08-12 DIAGNOSIS — E119 Type 2 diabetes mellitus without complications: Secondary | ICD-10-CM | POA: Diagnosis not present

## 2017-08-14 DIAGNOSIS — D631 Anemia in chronic kidney disease: Secondary | ICD-10-CM | POA: Diagnosis not present

## 2017-08-14 DIAGNOSIS — Z992 Dependence on renal dialysis: Secondary | ICD-10-CM | POA: Diagnosis not present

## 2017-08-14 DIAGNOSIS — E1129 Type 2 diabetes mellitus with other diabetic kidney complication: Secondary | ICD-10-CM | POA: Diagnosis not present

## 2017-08-14 DIAGNOSIS — E119 Type 2 diabetes mellitus without complications: Secondary | ICD-10-CM | POA: Diagnosis not present

## 2017-08-14 DIAGNOSIS — N2581 Secondary hyperparathyroidism of renal origin: Secondary | ICD-10-CM | POA: Diagnosis not present

## 2017-08-14 DIAGNOSIS — D509 Iron deficiency anemia, unspecified: Secondary | ICD-10-CM | POA: Diagnosis not present

## 2017-08-14 DIAGNOSIS — N186 End stage renal disease: Secondary | ICD-10-CM | POA: Diagnosis not present

## 2017-08-16 DIAGNOSIS — N186 End stage renal disease: Secondary | ICD-10-CM | POA: Diagnosis not present

## 2017-08-16 DIAGNOSIS — N2581 Secondary hyperparathyroidism of renal origin: Secondary | ICD-10-CM | POA: Diagnosis not present

## 2017-08-16 DIAGNOSIS — D631 Anemia in chronic kidney disease: Secondary | ICD-10-CM | POA: Diagnosis not present

## 2017-08-16 DIAGNOSIS — D509 Iron deficiency anemia, unspecified: Secondary | ICD-10-CM | POA: Diagnosis not present

## 2017-08-19 DIAGNOSIS — N2581 Secondary hyperparathyroidism of renal origin: Secondary | ICD-10-CM | POA: Diagnosis not present

## 2017-08-19 DIAGNOSIS — D509 Iron deficiency anemia, unspecified: Secondary | ICD-10-CM | POA: Diagnosis not present

## 2017-08-19 DIAGNOSIS — D631 Anemia in chronic kidney disease: Secondary | ICD-10-CM | POA: Diagnosis not present

## 2017-08-19 DIAGNOSIS — N186 End stage renal disease: Secondary | ICD-10-CM | POA: Diagnosis not present

## 2017-08-21 DIAGNOSIS — D509 Iron deficiency anemia, unspecified: Secondary | ICD-10-CM | POA: Diagnosis not present

## 2017-08-21 DIAGNOSIS — D631 Anemia in chronic kidney disease: Secondary | ICD-10-CM | POA: Diagnosis not present

## 2017-08-21 DIAGNOSIS — N2581 Secondary hyperparathyroidism of renal origin: Secondary | ICD-10-CM | POA: Diagnosis not present

## 2017-08-21 DIAGNOSIS — N186 End stage renal disease: Secondary | ICD-10-CM | POA: Diagnosis not present

## 2017-08-23 DIAGNOSIS — D509 Iron deficiency anemia, unspecified: Secondary | ICD-10-CM | POA: Diagnosis not present

## 2017-08-23 DIAGNOSIS — D631 Anemia in chronic kidney disease: Secondary | ICD-10-CM | POA: Diagnosis not present

## 2017-08-23 DIAGNOSIS — N186 End stage renal disease: Secondary | ICD-10-CM | POA: Diagnosis not present

## 2017-08-23 DIAGNOSIS — N2581 Secondary hyperparathyroidism of renal origin: Secondary | ICD-10-CM | POA: Diagnosis not present

## 2017-08-26 DIAGNOSIS — N186 End stage renal disease: Secondary | ICD-10-CM | POA: Diagnosis not present

## 2017-08-26 DIAGNOSIS — D631 Anemia in chronic kidney disease: Secondary | ICD-10-CM | POA: Diagnosis not present

## 2017-08-26 DIAGNOSIS — N2581 Secondary hyperparathyroidism of renal origin: Secondary | ICD-10-CM | POA: Diagnosis not present

## 2017-08-26 DIAGNOSIS — D509 Iron deficiency anemia, unspecified: Secondary | ICD-10-CM | POA: Diagnosis not present

## 2017-08-28 DIAGNOSIS — N186 End stage renal disease: Secondary | ICD-10-CM | POA: Diagnosis not present

## 2017-08-28 DIAGNOSIS — D631 Anemia in chronic kidney disease: Secondary | ICD-10-CM | POA: Diagnosis not present

## 2017-08-28 DIAGNOSIS — N2581 Secondary hyperparathyroidism of renal origin: Secondary | ICD-10-CM | POA: Diagnosis not present

## 2017-08-28 DIAGNOSIS — D509 Iron deficiency anemia, unspecified: Secondary | ICD-10-CM | POA: Diagnosis not present

## 2017-08-30 DIAGNOSIS — D509 Iron deficiency anemia, unspecified: Secondary | ICD-10-CM | POA: Diagnosis not present

## 2017-08-30 DIAGNOSIS — D631 Anemia in chronic kidney disease: Secondary | ICD-10-CM | POA: Diagnosis not present

## 2017-08-30 DIAGNOSIS — N2581 Secondary hyperparathyroidism of renal origin: Secondary | ICD-10-CM | POA: Diagnosis not present

## 2017-08-30 DIAGNOSIS — N186 End stage renal disease: Secondary | ICD-10-CM | POA: Diagnosis not present

## 2017-09-01 DIAGNOSIS — D631 Anemia in chronic kidney disease: Secondary | ICD-10-CM | POA: Diagnosis not present

## 2017-09-01 DIAGNOSIS — D509 Iron deficiency anemia, unspecified: Secondary | ICD-10-CM | POA: Diagnosis not present

## 2017-09-01 DIAGNOSIS — N186 End stage renal disease: Secondary | ICD-10-CM | POA: Diagnosis not present

## 2017-09-01 DIAGNOSIS — N2581 Secondary hyperparathyroidism of renal origin: Secondary | ICD-10-CM | POA: Diagnosis not present

## 2017-09-03 DIAGNOSIS — N186 End stage renal disease: Secondary | ICD-10-CM | POA: Diagnosis not present

## 2017-09-03 DIAGNOSIS — N2581 Secondary hyperparathyroidism of renal origin: Secondary | ICD-10-CM | POA: Diagnosis not present

## 2017-09-03 DIAGNOSIS — D509 Iron deficiency anemia, unspecified: Secondary | ICD-10-CM | POA: Diagnosis not present

## 2017-09-03 DIAGNOSIS — D631 Anemia in chronic kidney disease: Secondary | ICD-10-CM | POA: Diagnosis not present

## 2017-09-06 DIAGNOSIS — N186 End stage renal disease: Secondary | ICD-10-CM | POA: Diagnosis not present

## 2017-09-06 DIAGNOSIS — N2581 Secondary hyperparathyroidism of renal origin: Secondary | ICD-10-CM | POA: Diagnosis not present

## 2017-09-06 DIAGNOSIS — D631 Anemia in chronic kidney disease: Secondary | ICD-10-CM | POA: Diagnosis not present

## 2017-09-06 DIAGNOSIS — D509 Iron deficiency anemia, unspecified: Secondary | ICD-10-CM | POA: Diagnosis not present

## 2017-09-07 ENCOUNTER — Other Ambulatory Visit: Payer: Self-pay

## 2017-09-07 ENCOUNTER — Inpatient Hospital Stay (HOSPITAL_COMMUNITY)
Admission: EM | Admit: 2017-09-07 | Discharge: 2017-10-07 | DRG: 216 | Disposition: A | Discharge status: Skilled Nursing Facility | Payer: Medicare Other | Attending: Thoracic Surgery (Cardiothoracic Vascular Surgery) | Admitting: Thoracic Surgery (Cardiothoracic Vascular Surgery)

## 2017-09-07 ENCOUNTER — Encounter (HOSPITAL_COMMUNITY): Payer: Self-pay

## 2017-09-07 DIAGNOSIS — I214 Non-ST elevation (NSTEMI) myocardial infarction: Secondary | ICD-10-CM

## 2017-09-07 DIAGNOSIS — E1169 Type 2 diabetes mellitus with other specified complication: Secondary | ICD-10-CM

## 2017-09-07 DIAGNOSIS — Z6834 Body mass index (BMI) 34.0-34.9, adult: Secondary | ICD-10-CM

## 2017-09-07 DIAGNOSIS — R471 Dysarthria and anarthria: Secondary | ICD-10-CM | POA: Diagnosis not present

## 2017-09-07 DIAGNOSIS — I5022 Chronic systolic (congestive) heart failure: Secondary | ICD-10-CM | POA: Diagnosis present

## 2017-09-07 DIAGNOSIS — Z992 Dependence on renal dialysis: Secondary | ICD-10-CM

## 2017-09-07 DIAGNOSIS — I120 Hypertensive chronic kidney disease with stage 5 chronic kidney disease or end stage renal disease: Secondary | ICD-10-CM

## 2017-09-07 DIAGNOSIS — R069 Unspecified abnormalities of breathing: Secondary | ICD-10-CM | POA: Diagnosis not present

## 2017-09-07 DIAGNOSIS — M25512 Pain in left shoulder: Secondary | ICD-10-CM | POA: Diagnosis not present

## 2017-09-07 DIAGNOSIS — I6340 Cerebral infarction due to embolism of unspecified cerebral artery: Secondary | ICD-10-CM

## 2017-09-07 DIAGNOSIS — I5042 Chronic combined systolic (congestive) and diastolic (congestive) heart failure: Secondary | ICD-10-CM

## 2017-09-07 DIAGNOSIS — I429 Cardiomyopathy, unspecified: Secondary | ICD-10-CM | POA: Diagnosis not present

## 2017-09-07 DIAGNOSIS — E8770 Fluid overload, unspecified: Secondary | ICD-10-CM

## 2017-09-07 DIAGNOSIS — I132 Hypertensive heart and chronic kidney disease with heart failure and with stage 5 chronic kidney disease, or end stage renal disease: Secondary | ICD-10-CM | POA: Diagnosis not present

## 2017-09-07 DIAGNOSIS — I340 Nonrheumatic mitral (valve) insufficiency: Secondary | ICD-10-CM | POA: Diagnosis present

## 2017-09-07 DIAGNOSIS — R2689 Other abnormalities of gait and mobility: Secondary | ICD-10-CM | POA: Diagnosis present

## 2017-09-07 DIAGNOSIS — I69398 Other sequelae of cerebral infarction: Secondary | ICD-10-CM

## 2017-09-07 DIAGNOSIS — N521 Erectile dysfunction due to diseases classified elsewhere: Secondary | ICD-10-CM | POA: Diagnosis present

## 2017-09-07 DIAGNOSIS — Z960 Presence of urogenital implants: Secondary | ICD-10-CM | POA: Diagnosis present

## 2017-09-07 DIAGNOSIS — I4729 Other ventricular tachycardia: Secondary | ICD-10-CM

## 2017-09-07 DIAGNOSIS — R0602 Shortness of breath: Secondary | ICD-10-CM | POA: Diagnosis not present

## 2017-09-07 DIAGNOSIS — B1920 Unspecified viral hepatitis C without hepatic coma: Secondary | ICD-10-CM | POA: Diagnosis present

## 2017-09-07 DIAGNOSIS — Z7902 Long term (current) use of antithrombotics/antiplatelets: Secondary | ICD-10-CM

## 2017-09-07 DIAGNOSIS — Z794 Long term (current) use of insulin: Secondary | ICD-10-CM

## 2017-09-07 DIAGNOSIS — G92 Toxic encephalopathy: Secondary | ICD-10-CM | POA: Diagnosis not present

## 2017-09-07 DIAGNOSIS — J9811 Atelectasis: Secondary | ICD-10-CM | POA: Diagnosis not present

## 2017-09-07 DIAGNOSIS — R5381 Other malaise: Secondary | ICD-10-CM | POA: Diagnosis not present

## 2017-09-07 DIAGNOSIS — E1165 Type 2 diabetes mellitus with hyperglycemia: Secondary | ICD-10-CM | POA: Diagnosis present

## 2017-09-07 DIAGNOSIS — R0902 Hypoxemia: Secondary | ICD-10-CM | POA: Diagnosis present

## 2017-09-07 DIAGNOSIS — I5043 Acute on chronic combined systolic (congestive) and diastolic (congestive) heart failure: Secondary | ICD-10-CM

## 2017-09-07 DIAGNOSIS — I472 Ventricular tachycardia: Secondary | ICD-10-CM | POA: Diagnosis not present

## 2017-09-07 DIAGNOSIS — Z23 Encounter for immunization: Secondary | ICD-10-CM

## 2017-09-07 DIAGNOSIS — J449 Chronic obstructive pulmonary disease, unspecified: Secondary | ICD-10-CM | POA: Diagnosis present

## 2017-09-07 DIAGNOSIS — Z79899 Other long term (current) drug therapy: Secondary | ICD-10-CM

## 2017-09-07 DIAGNOSIS — N2581 Secondary hyperparathyroidism of renal origin: Secondary | ICD-10-CM | POA: Diagnosis present

## 2017-09-07 DIAGNOSIS — R7989 Other specified abnormal findings of blood chemistry: Secondary | ICD-10-CM | POA: Diagnosis not present

## 2017-09-07 DIAGNOSIS — G4733 Obstructive sleep apnea (adult) (pediatric): Secondary | ICD-10-CM | POA: Diagnosis present

## 2017-09-07 DIAGNOSIS — I4892 Unspecified atrial flutter: Secondary | ICD-10-CM | POA: Diagnosis not present

## 2017-09-07 DIAGNOSIS — E1122 Type 2 diabetes mellitus with diabetic chronic kidney disease: Secondary | ICD-10-CM

## 2017-09-07 DIAGNOSIS — R748 Abnormal levels of other serum enzymes: Secondary | ICD-10-CM | POA: Diagnosis not present

## 2017-09-07 DIAGNOSIS — E11319 Type 2 diabetes mellitus with unspecified diabetic retinopathy without macular edema: Secondary | ICD-10-CM | POA: Diagnosis present

## 2017-09-07 DIAGNOSIS — I313 Pericardial effusion (noninflammatory): Secondary | ICD-10-CM | POA: Diagnosis present

## 2017-09-07 DIAGNOSIS — J438 Other emphysema: Secondary | ICD-10-CM

## 2017-09-07 DIAGNOSIS — Z8249 Family history of ischemic heart disease and other diseases of the circulatory system: Secondary | ICD-10-CM

## 2017-09-07 DIAGNOSIS — D631 Anemia in chronic kidney disease: Secondary | ICD-10-CM | POA: Diagnosis present

## 2017-09-07 DIAGNOSIS — N186 End stage renal disease: Secondary | ICD-10-CM

## 2017-09-07 DIAGNOSIS — R791 Abnormal coagulation profile: Secondary | ICD-10-CM | POA: Diagnosis not present

## 2017-09-07 DIAGNOSIS — Z951 Presence of aortocoronary bypass graft: Secondary | ICD-10-CM

## 2017-09-07 DIAGNOSIS — M109 Gout, unspecified: Secondary | ICD-10-CM | POA: Diagnosis present

## 2017-09-07 DIAGNOSIS — G459 Transient cerebral ischemic attack, unspecified: Secondary | ICD-10-CM

## 2017-09-07 DIAGNOSIS — E785 Hyperlipidemia, unspecified: Secondary | ICD-10-CM

## 2017-09-07 DIAGNOSIS — R079 Chest pain, unspecified: Secondary | ICD-10-CM

## 2017-09-07 DIAGNOSIS — I871 Compression of vein: Secondary | ICD-10-CM | POA: Diagnosis not present

## 2017-09-07 DIAGNOSIS — I2510 Atherosclerotic heart disease of native coronary artery without angina pectoris: Secondary | ICD-10-CM

## 2017-09-07 DIAGNOSIS — I959 Hypotension, unspecified: Secondary | ICD-10-CM | POA: Diagnosis not present

## 2017-09-07 DIAGNOSIS — E1121 Type 2 diabetes mellitus with diabetic nephropathy: Secondary | ICD-10-CM | POA: Diagnosis present

## 2017-09-07 DIAGNOSIS — Z961 Presence of intraocular lens: Secondary | ICD-10-CM | POA: Diagnosis present

## 2017-09-07 DIAGNOSIS — Z781 Physical restraint status: Secondary | ICD-10-CM

## 2017-09-07 DIAGNOSIS — I440 Atrioventricular block, first degree: Secondary | ICD-10-CM | POA: Diagnosis not present

## 2017-09-07 DIAGNOSIS — Z7982 Long term (current) use of aspirin: Secondary | ICD-10-CM

## 2017-09-07 DIAGNOSIS — R569 Unspecified convulsions: Secondary | ICD-10-CM | POA: Diagnosis present

## 2017-09-07 DIAGNOSIS — I509 Heart failure, unspecified: Secondary | ICD-10-CM

## 2017-09-07 DIAGNOSIS — R0600 Dyspnea, unspecified: Secondary | ICD-10-CM | POA: Diagnosis present

## 2017-09-07 DIAGNOSIS — E669 Obesity, unspecified: Secondary | ICD-10-CM | POA: Diagnosis present

## 2017-09-07 DIAGNOSIS — Z833 Family history of diabetes mellitus: Secondary | ICD-10-CM

## 2017-09-07 DIAGNOSIS — Z538 Procedure and treatment not carried out for other reasons: Secondary | ICD-10-CM | POA: Diagnosis present

## 2017-09-07 DIAGNOSIS — Z09 Encounter for follow-up examination after completed treatment for conditions other than malignant neoplasm: Secondary | ICD-10-CM

## 2017-09-07 DIAGNOSIS — R778 Other specified abnormalities of plasma proteins: Secondary | ICD-10-CM

## 2017-09-07 DIAGNOSIS — D72829 Elevated white blood cell count, unspecified: Secondary | ICD-10-CM | POA: Diagnosis not present

## 2017-09-07 DIAGNOSIS — IMO0002 Reserved for concepts with insufficient information to code with codable children: Secondary | ICD-10-CM

## 2017-09-07 DIAGNOSIS — Z9689 Presence of other specified functional implants: Secondary | ICD-10-CM

## 2017-09-07 MED ORDER — ALBUTEROL SULFATE (2.5 MG/3ML) 0.083% IN NEBU
5.0000 mg | INHALATION_SOLUTION | Freq: Once | RESPIRATORY_TRACT | Status: AC
  Administered 2017-09-07: 5 mg via RESPIRATORY_TRACT
  Filled 2017-09-07: qty 6

## 2017-09-07 NOTE — ED Triage Notes (Signed)
Per EMS pt from home. Dialysis pt.  M,W,F.  Has kept all dialysis appts.  Pt increasingly short of breath x 2 days. Room air O2 sats on EMS arrival were 88%.  Placed on 4L [place] and sats are currently 99%.  Pt has history of stroke but speech, etc is at baseline per family. No new deficits.

## 2017-09-08 ENCOUNTER — Emergency Department (HOSPITAL_COMMUNITY): Payer: Medicare Other

## 2017-09-08 ENCOUNTER — Encounter (HOSPITAL_COMMUNITY): Payer: Self-pay | Admitting: Internal Medicine

## 2017-09-08 ENCOUNTER — Other Ambulatory Visit (HOSPITAL_COMMUNITY): Payer: Medicare Other

## 2017-09-08 ENCOUNTER — Inpatient Hospital Stay (HOSPITAL_COMMUNITY): Payer: Medicare Other

## 2017-09-08 DIAGNOSIS — I429 Cardiomyopathy, unspecified: Secondary | ICD-10-CM | POA: Diagnosis not present

## 2017-09-08 DIAGNOSIS — I340 Nonrheumatic mitral (valve) insufficiency: Secondary | ICD-10-CM | POA: Diagnosis not present

## 2017-09-08 DIAGNOSIS — E1122 Type 2 diabetes mellitus with diabetic chronic kidney disease: Secondary | ICD-10-CM | POA: Diagnosis not present

## 2017-09-08 DIAGNOSIS — N186 End stage renal disease: Secondary | ICD-10-CM

## 2017-09-08 DIAGNOSIS — Z960 Presence of urogenital implants: Secondary | ICD-10-CM

## 2017-09-08 DIAGNOSIS — I6523 Occlusion and stenosis of bilateral carotid arteries: Secondary | ICD-10-CM | POA: Diagnosis not present

## 2017-09-08 DIAGNOSIS — G4733 Obstructive sleep apnea (adult) (pediatric): Secondary | ICD-10-CM | POA: Diagnosis present

## 2017-09-08 DIAGNOSIS — I214 Non-ST elevation (NSTEMI) myocardial infarction: Secondary | ICD-10-CM

## 2017-09-08 DIAGNOSIS — N529 Male erectile dysfunction, unspecified: Secondary | ICD-10-CM

## 2017-09-08 DIAGNOSIS — Z992 Dependence on renal dialysis: Secondary | ICD-10-CM

## 2017-09-08 DIAGNOSIS — M25512 Pain in left shoulder: Secondary | ICD-10-CM | POA: Diagnosis not present

## 2017-09-08 DIAGNOSIS — Z7902 Long term (current) use of antithrombotics/antiplatelets: Secondary | ICD-10-CM | POA: Diagnosis not present

## 2017-09-08 DIAGNOSIS — E119 Type 2 diabetes mellitus without complications: Secondary | ICD-10-CM | POA: Diagnosis not present

## 2017-09-08 DIAGNOSIS — Z79899 Other long term (current) drug therapy: Secondary | ICD-10-CM | POA: Diagnosis not present

## 2017-09-08 DIAGNOSIS — Z01810 Encounter for preprocedural cardiovascular examination: Secondary | ICD-10-CM | POA: Diagnosis not present

## 2017-09-08 DIAGNOSIS — I472 Ventricular tachycardia: Secondary | ICD-10-CM

## 2017-09-08 DIAGNOSIS — I639 Cerebral infarction, unspecified: Secondary | ICD-10-CM | POA: Diagnosis not present

## 2017-09-08 DIAGNOSIS — E8770 Fluid overload, unspecified: Secondary | ICD-10-CM | POA: Diagnosis not present

## 2017-09-08 DIAGNOSIS — D631 Anemia in chronic kidney disease: Secondary | ICD-10-CM | POA: Diagnosis not present

## 2017-09-08 DIAGNOSIS — E785 Hyperlipidemia, unspecified: Secondary | ICD-10-CM

## 2017-09-08 DIAGNOSIS — Z8249 Family history of ischemic heart disease and other diseases of the circulatory system: Secondary | ICD-10-CM

## 2017-09-08 DIAGNOSIS — Z8673 Personal history of transient ischemic attack (TIA), and cerebral infarction without residual deficits: Secondary | ICD-10-CM | POA: Diagnosis not present

## 2017-09-08 DIAGNOSIS — Z87891 Personal history of nicotine dependence: Secondary | ICD-10-CM

## 2017-09-08 DIAGNOSIS — S20219A Contusion of unspecified front wall of thorax, initial encounter: Secondary | ICD-10-CM | POA: Diagnosis not present

## 2017-09-08 DIAGNOSIS — W1830XA Fall on same level, unspecified, initial encounter: Secondary | ICD-10-CM | POA: Diagnosis not present

## 2017-09-08 DIAGNOSIS — Z7901 Long term (current) use of anticoagulants: Secondary | ICD-10-CM | POA: Diagnosis not present

## 2017-09-08 DIAGNOSIS — Z9861 Coronary angioplasty status: Secondary | ICD-10-CM | POA: Diagnosis not present

## 2017-09-08 DIAGNOSIS — Z4682 Encounter for fitting and adjustment of non-vascular catheter: Secondary | ICD-10-CM | POA: Diagnosis not present

## 2017-09-08 DIAGNOSIS — E162 Hypoglycemia, unspecified: Secondary | ICD-10-CM | POA: Diagnosis not present

## 2017-09-08 DIAGNOSIS — J811 Chronic pulmonary edema: Secondary | ICD-10-CM | POA: Diagnosis not present

## 2017-09-08 DIAGNOSIS — Z95818 Presence of other cardiac implants and grafts: Secondary | ICD-10-CM | POA: Diagnosis not present

## 2017-09-08 DIAGNOSIS — I63011 Cerebral infarction due to thrombosis of right vertebral artery: Secondary | ICD-10-CM | POA: Diagnosis not present

## 2017-09-08 DIAGNOSIS — N2889 Other specified disorders of kidney and ureter: Secondary | ICD-10-CM | POA: Diagnosis not present

## 2017-09-08 DIAGNOSIS — I313 Pericardial effusion (noninflammatory): Secondary | ICD-10-CM | POA: Diagnosis not present

## 2017-09-08 DIAGNOSIS — R0602 Shortness of breath: Secondary | ICD-10-CM | POA: Diagnosis not present

## 2017-09-08 DIAGNOSIS — R140 Abdominal distension (gaseous): Secondary | ICD-10-CM | POA: Diagnosis not present

## 2017-09-08 DIAGNOSIS — R918 Other nonspecific abnormal finding of lung field: Secondary | ICD-10-CM | POA: Diagnosis not present

## 2017-09-08 DIAGNOSIS — R410 Disorientation, unspecified: Secondary | ICD-10-CM | POA: Diagnosis not present

## 2017-09-08 DIAGNOSIS — R4182 Altered mental status, unspecified: Secondary | ICD-10-CM | POA: Diagnosis present

## 2017-09-08 DIAGNOSIS — I4581 Long QT syndrome: Secondary | ICD-10-CM | POA: Diagnosis not present

## 2017-09-08 DIAGNOSIS — E1129 Type 2 diabetes mellitus with other diabetic kidney complication: Secondary | ICD-10-CM | POA: Diagnosis not present

## 2017-09-08 DIAGNOSIS — I081 Rheumatic disorders of both mitral and tricuspid valves: Secondary | ICD-10-CM | POA: Diagnosis not present

## 2017-09-08 DIAGNOSIS — I69398 Other sequelae of cerebral infarction: Secondary | ICD-10-CM | POA: Diagnosis not present

## 2017-09-08 DIAGNOSIS — I499 Cardiac arrhythmia, unspecified: Secondary | ICD-10-CM | POA: Diagnosis not present

## 2017-09-08 DIAGNOSIS — I4892 Unspecified atrial flutter: Secondary | ICD-10-CM | POA: Diagnosis not present

## 2017-09-08 DIAGNOSIS — R0989 Other specified symptoms and signs involving the circulatory and respiratory systems: Secondary | ICD-10-CM | POA: Diagnosis not present

## 2017-09-08 DIAGNOSIS — Z794 Long term (current) use of insulin: Secondary | ICD-10-CM

## 2017-09-08 DIAGNOSIS — Y92129 Unspecified place in nursing home as the place of occurrence of the external cause: Secondary | ICD-10-CM | POA: Diagnosis not present

## 2017-09-08 DIAGNOSIS — E1165 Type 2 diabetes mellitus with hyperglycemia: Secondary | ICD-10-CM | POA: Diagnosis not present

## 2017-09-08 DIAGNOSIS — Z23 Encounter for immunization: Secondary | ICD-10-CM | POA: Diagnosis not present

## 2017-09-08 DIAGNOSIS — N521 Erectile dysfunction due to diseases classified elsewhere: Secondary | ICD-10-CM | POA: Diagnosis present

## 2017-09-08 DIAGNOSIS — M25511 Pain in right shoulder: Secondary | ICD-10-CM | POA: Diagnosis not present

## 2017-09-08 DIAGNOSIS — I509 Heart failure, unspecified: Secondary | ICD-10-CM | POA: Diagnosis not present

## 2017-09-08 DIAGNOSIS — J90 Pleural effusion, not elsewhere classified: Secondary | ICD-10-CM | POA: Diagnosis not present

## 2017-09-08 DIAGNOSIS — J449 Chronic obstructive pulmonary disease, unspecified: Secondary | ICD-10-CM

## 2017-09-08 DIAGNOSIS — G92 Toxic encephalopathy: Secondary | ICD-10-CM | POA: Diagnosis not present

## 2017-09-08 DIAGNOSIS — I2510 Atherosclerotic heart disease of native coronary artery without angina pectoris: Secondary | ICD-10-CM | POA: Diagnosis not present

## 2017-09-08 DIAGNOSIS — I2699 Other pulmonary embolism without acute cor pulmonale: Secondary | ICD-10-CM

## 2017-09-08 DIAGNOSIS — R008 Other abnormalities of heart beat: Secondary | ICD-10-CM | POA: Diagnosis not present

## 2017-09-08 DIAGNOSIS — I110 Hypertensive heart disease with heart failure: Secondary | ICD-10-CM | POA: Diagnosis not present

## 2017-09-08 DIAGNOSIS — I120 Hypertensive chronic kidney disease with stage 5 chronic kidney disease or end stage renal disease: Secondary | ICD-10-CM | POA: Diagnosis not present

## 2017-09-08 DIAGNOSIS — I132 Hypertensive heart and chronic kidney disease with heart failure and with stage 5 chronic kidney disease, or end stage renal disease: Secondary | ICD-10-CM

## 2017-09-08 DIAGNOSIS — R569 Unspecified convulsions: Secondary | ICD-10-CM | POA: Diagnosis not present

## 2017-09-08 DIAGNOSIS — I252 Old myocardial infarction: Secondary | ICD-10-CM | POA: Diagnosis not present

## 2017-09-08 DIAGNOSIS — E1121 Type 2 diabetes mellitus with diabetic nephropathy: Secondary | ICD-10-CM | POA: Diagnosis present

## 2017-09-08 DIAGNOSIS — R609 Edema, unspecified: Secondary | ICD-10-CM | POA: Diagnosis not present

## 2017-09-08 DIAGNOSIS — I151 Hypertension secondary to other renal disorders: Secondary | ICD-10-CM

## 2017-09-08 DIAGNOSIS — R0609 Other forms of dyspnea: Secondary | ICD-10-CM | POA: Diagnosis not present

## 2017-09-08 DIAGNOSIS — I5043 Acute on chronic combined systolic (congestive) and diastolic (congestive) heart failure: Secondary | ICD-10-CM | POA: Diagnosis not present

## 2017-09-08 DIAGNOSIS — J9811 Atelectasis: Secondary | ICD-10-CM | POA: Diagnosis not present

## 2017-09-08 DIAGNOSIS — Y939 Activity, unspecified: Secondary | ICD-10-CM | POA: Diagnosis not present

## 2017-09-08 DIAGNOSIS — E669 Obesity, unspecified: Secondary | ICD-10-CM

## 2017-09-08 DIAGNOSIS — Z7982 Long term (current) use of aspirin: Secondary | ICD-10-CM | POA: Diagnosis not present

## 2017-09-08 DIAGNOSIS — I69359 Hemiplegia and hemiparesis following cerebral infarction affecting unspecified side: Secondary | ICD-10-CM

## 2017-09-08 DIAGNOSIS — N2581 Secondary hyperparathyroidism of renal origin: Secondary | ICD-10-CM | POA: Diagnosis not present

## 2017-09-08 DIAGNOSIS — M109 Gout, unspecified: Secondary | ICD-10-CM | POA: Diagnosis not present

## 2017-09-08 DIAGNOSIS — R4020 Unspecified coma: Secondary | ICD-10-CM | POA: Diagnosis not present

## 2017-09-08 DIAGNOSIS — E1065 Type 1 diabetes mellitus with hyperglycemia: Secondary | ICD-10-CM | POA: Diagnosis not present

## 2017-09-08 DIAGNOSIS — I5042 Chronic combined systolic (congestive) and diastolic (congestive) heart failure: Secondary | ICD-10-CM | POA: Diagnosis not present

## 2017-09-08 DIAGNOSIS — Z833 Family history of diabetes mellitus: Secondary | ICD-10-CM

## 2017-09-08 DIAGNOSIS — R0600 Dyspnea, unspecified: Secondary | ICD-10-CM | POA: Diagnosis present

## 2017-09-08 DIAGNOSIS — I25110 Atherosclerotic heart disease of native coronary artery with unstable angina pectoris: Secondary | ICD-10-CM | POA: Diagnosis not present

## 2017-09-08 DIAGNOSIS — Z951 Presence of aortocoronary bypass graft: Secondary | ICD-10-CM | POA: Diagnosis not present

## 2017-09-08 DIAGNOSIS — D509 Iron deficiency anemia, unspecified: Secondary | ICD-10-CM | POA: Diagnosis not present

## 2017-09-08 DIAGNOSIS — Z8669 Personal history of other diseases of the nervous system and sense organs: Secondary | ICD-10-CM

## 2017-09-08 DIAGNOSIS — I517 Cardiomegaly: Secondary | ICD-10-CM | POA: Diagnosis not present

## 2017-09-08 DIAGNOSIS — R748 Abnormal levels of other serum enzymes: Secondary | ICD-10-CM | POA: Diagnosis not present

## 2017-09-08 DIAGNOSIS — R931 Abnormal findings on diagnostic imaging of heart and coronary circulation: Secondary | ICD-10-CM | POA: Diagnosis not present

## 2017-09-08 DIAGNOSIS — I871 Compression of vein: Secondary | ICD-10-CM | POA: Diagnosis not present

## 2017-09-08 DIAGNOSIS — Y999 Unspecified external cause status: Secondary | ICD-10-CM | POA: Diagnosis not present

## 2017-09-08 LAB — ECHOCARDIOGRAM COMPLETE
FS: 12 % — AB (ref 28–44)
Height: 69 in
IV/PV OW: 1.03
LA diam end sys: 46 mm
LA diam index: 2.07 cm/m2
LA vol A4C: 55 ml
LA vol index: 36.3 mL/m2
LA vol: 80.6 mL
LASIZE: 46 mm
LDCA: 3.8 cm2
LV PW d: 10.6 mm — AB (ref 0.6–1.1)
LVOT VTI: 14.1 cm
LVOT diameter: 22 mm
LVOTPV: 73.2 cm/s
LVOTSV: 54 mL
MV VTI: 113 cm
RV sys press: 50 mmHg
Reg peak vel: 344 cm/s
TAPSE: 21.7 mm
TRMAXVEL: 344 cm/s
Weight: 3470.92 oz

## 2017-09-08 LAB — TROPONIN I
Troponin I: 8.33 ng/mL (ref <0.03)
Troponin I: 10.11 ng/mL (ref <0.03)
Troponin I: 8.61 ng/mL (ref <0.03)

## 2017-09-08 LAB — MRSA PCR SCREENING: MRSA BY PCR: NEGATIVE

## 2017-09-08 LAB — GLUCOSE, CAPILLARY
GLUCOSE-CAPILLARY: 406 mg/dL — AB (ref 65–99)
GLUCOSE-CAPILLARY: 220 mg/dL — AB (ref 65–99)
Glucose-Capillary: 342 mg/dL — ABNORMAL HIGH (ref 65–99)
Glucose-Capillary: 288 mg/dL — ABNORMAL HIGH (ref 65–99)
Glucose-Capillary: 209 mg/dL — ABNORMAL HIGH (ref 65–99)

## 2017-09-08 LAB — CBC WITH DIFFERENTIAL/PLATELET
BASOS ABS: 0 10*3/uL (ref 0.0–0.1)
Basophils Relative: 0 %
Eosinophils Absolute: 0 10*3/uL (ref 0.0–0.7)
Eosinophils Relative: 0 %
HCT: 34.8 % — ABNORMAL LOW (ref 39.0–52.0)
HEMOGLOBIN: 11.6 g/dL — AB (ref 13.0–17.0)
LYMPHS ABS: 1.2 10*3/uL (ref 0.7–4.0)
LYMPHS PCT: 10 %
MCH: 29.9 pg (ref 26.0–34.0)
MCHC: 33.3 g/dL (ref 30.0–36.0)
MCV: 89.7 fL (ref 78.0–100.0)
Monocytes Absolute: 0.8 10*3/uL (ref 0.1–1.0)
Monocytes Relative: 7 %
NEUTROS ABS: 9.6 10*3/uL — AB (ref 1.7–7.7)
NEUTROS PCT: 83 %
Platelets: 145 10*3/uL — ABNORMAL LOW (ref 150–400)
RBC: 3.88 MIL/uL — AB (ref 4.22–5.81)
RDW: 14.7 % (ref 11.5–15.5)
WBC: 11.7 10*3/uL — AB (ref 4.0–10.5)

## 2017-09-08 LAB — COMPREHENSIVE METABOLIC PANEL
ALT: 13 U/L — AB (ref 17–63)
ANION GAP: 18 — AB (ref 5–15)
AST: 25 U/L (ref 15–41)
Albumin: 3.5 g/dL (ref 3.5–5.0)
Alkaline Phosphatase: 50 U/L (ref 38–126)
BUN: 71 mg/dL — ABNORMAL HIGH (ref 6–20)
CHLORIDE: 94 mmol/L — AB (ref 101–111)
CO2: 22 mmol/L (ref 22–32)
CREATININE: 10.39 mg/dL — AB (ref 0.61–1.24)
Calcium: 9.4 mg/dL (ref 8.9–10.3)
GFR calc non Af Amer: 4 mL/min — ABNORMAL LOW (ref >60)
GFR, EST AFRICAN AMERICAN: 5 mL/min — AB (ref >60)
Glucose, Bld: 407 mg/dL — ABNORMAL HIGH (ref 65–99)
POTASSIUM: 4.4 mmol/L (ref 3.5–5.1)
SODIUM: 134 mmol/L — AB (ref 135–145)
Total Bilirubin: 0.8 mg/dL (ref 0.3–1.2)
Total Protein: 6.5 g/dL (ref 6.5–8.1)

## 2017-09-08 LAB — I-STAT TROPONIN, ED: Troponin i, poc: 6.48 ng/mL (ref 0.00–0.08)

## 2017-09-08 LAB — HEPARIN LEVEL (UNFRACTIONATED): HEPARIN UNFRACTIONATED: 0.31 [IU]/mL (ref 0.30–0.70)

## 2017-09-08 LAB — BRAIN NATRIURETIC PEPTIDE: B Natriuretic Peptide: 4125.9 pg/mL — ABNORMAL HIGH (ref 0.0–100.0)

## 2017-09-08 MED ORDER — HEPARIN BOLUS VIA INFUSION
4000.0000 [IU] | Freq: Once | INTRAVENOUS | Status: AC
  Administered 2017-09-08: 4000 [IU] via INTRAVENOUS
  Filled 2017-09-08: qty 4000

## 2017-09-08 MED ORDER — HEPARIN SODIUM (PORCINE) 5000 UNIT/ML IJ SOLN: 5000.0000 [IU] | Freq: Three times a day (TID) | INTRAMUSCULAR | Status: DC

## 2017-09-08 MED ORDER — CINACALCET HCL 30 MG PO TABS
120.0000 mg | ORAL_TABLET | Freq: Every day | ORAL | Status: DC
  Administered 2017-09-08 – 2017-10-03 (×22): 120 mg via ORAL
  Filled 2017-09-08 (×25): qty 4

## 2017-09-08 MED ORDER — ASPIRIN EC 81 MG PO TBEC
81.0000 mg | DELAYED_RELEASE_TABLET | Freq: Every day | ORAL | Status: DC
  Administered 2017-09-08 – 2017-09-12 (×5): 81 mg via ORAL
  Filled 2017-09-08 (×6): qty 1

## 2017-09-08 MED ORDER — INSULIN GLARGINE 100 UNIT/ML ~~LOC~~ SOLN
10.0000 [IU] | Freq: Every day | SUBCUTANEOUS | Status: DC
  Administered 2017-09-08: 5 [IU] via SUBCUTANEOUS
  Administered 2017-09-09: 10 [IU] via SUBCUTANEOUS
  Filled 2017-09-08 (×2): qty 0.1

## 2017-09-08 MED ORDER — SODIUM CHLORIDE 0.9 % IV SOLN: INTRAVENOUS | Status: DC

## 2017-09-08 MED ORDER — METOPROLOL TARTRATE 100 MG PO TABS
100.0000 mg | ORAL_TABLET | Freq: Two times a day (BID) | ORAL | Status: DC
  Filled 2017-09-08 (×3): qty 1

## 2017-09-08 MED ORDER — CINACALCET HCL 30 MG PO TABS
60.0000 mg | ORAL_TABLET | Freq: Every day | ORAL | Status: DC
  Filled 2017-09-08: qty 2

## 2017-09-08 MED ORDER — LEVETIRACETAM 250 MG PO TABS
1000.0000 mg | ORAL_TABLET | Freq: Every day | ORAL | Status: DC
  Administered 2017-09-08 – 2017-09-20 (×12): 1000 mg via ORAL
  Filled 2017-09-08 (×11): qty 4
  Filled 2017-09-08 (×2): qty 2
  Filled 2017-09-08: qty 4

## 2017-09-08 MED ORDER — ASPIRIN EC 81 MG PO TBEC: 243.0000 mg | DELAYED_RELEASE_TABLET | Freq: Once | ORAL | Status: DC

## 2017-09-08 MED ORDER — RENA-VITE PO TABS
1.0000 | ORAL_TABLET | Freq: Every day | ORAL | Status: DC
  Administered 2017-09-08 – 2017-09-11 (×4): 1 via ORAL
  Filled 2017-09-08 (×4): qty 1

## 2017-09-08 MED ORDER — SODIUM CHLORIDE 0.9% FLUSH: 3.0000 mL | Freq: Two times a day (BID) | INTRAVENOUS | Status: DC

## 2017-09-08 MED ORDER — METOPROLOL TARTRATE 25 MG PO TABS
50.0000 mg | ORAL_TABLET | Freq: Two times a day (BID) | ORAL | Status: DC
  Filled 2017-09-08: qty 2

## 2017-09-08 MED ORDER — CLOPIDOGREL BISULFATE 75 MG PO TABS
75.0000 mg | ORAL_TABLET | Freq: Every day | ORAL | Status: DC
  Administered 2017-09-08 – 2017-09-09 (×2): 75 mg via ORAL
  Filled 2017-09-08 (×2): qty 1

## 2017-09-08 MED ORDER — INSULIN ASPART 100 UNIT/ML ~~LOC~~ SOLN
3.0000 [IU] | Freq: Three times a day (TID) | SUBCUTANEOUS | Status: DC
  Administered 2017-09-08 – 2017-09-12 (×9): 3 [IU] via SUBCUTANEOUS

## 2017-09-08 MED ORDER — INSULIN ASPART 100 UNIT/ML ~~LOC~~ SOLN
0.0000 [IU] | Freq: Three times a day (TID) | SUBCUTANEOUS | Status: DC
  Administered 2017-09-08: 9 [IU] via SUBCUTANEOUS
  Administered 2017-09-08: 3 [IU] via SUBCUTANEOUS
  Administered 2017-09-09: 2 [IU] via SUBCUTANEOUS
  Administered 2017-09-09: 3 [IU] via SUBCUTANEOUS
  Administered 2017-09-10: 2 [IU] via SUBCUTANEOUS
  Administered 2017-09-10 – 2017-09-11 (×2): 5 [IU] via SUBCUTANEOUS
  Administered 2017-09-11: 2 [IU] via SUBCUTANEOUS
  Administered 2017-09-12: 7 [IU] via SUBCUTANEOUS
  Administered 2017-09-12: 2 [IU] via SUBCUTANEOUS

## 2017-09-08 MED ORDER — HEPARIN (PORCINE) IN NACL 100-0.45 UNIT/ML-% IJ SOLN
1400.0000 [IU]/h | INTRAMUSCULAR | Status: DC
  Administered 2017-09-08: 1200 [IU]/h via INTRAVENOUS
  Administered 2017-09-09: 1400 [IU]/h via INTRAVENOUS
  Filled 2017-09-08 (×2): qty 250

## 2017-09-08 MED ORDER — ATORVASTATIN CALCIUM 40 MG PO TABS
40.0000 mg | ORAL_TABLET | Freq: Every day | ORAL | Status: DC
  Administered 2017-09-08 – 2017-09-09 (×2): 40 mg via ORAL
  Filled 2017-09-08 (×2): qty 1

## 2017-09-08 MED ORDER — ASPIRIN 325 MG PO TABS
325.0000 mg | ORAL_TABLET | Freq: Once | ORAL | Status: AC
  Administered 2017-09-08: 325 mg via ORAL
  Filled 2017-09-08: qty 1

## 2017-09-08 MED ORDER — ASPIRIN 81 MG PO CHEW: 81.0000 mg | CHEWABLE_TABLET | ORAL | Status: AC

## 2017-09-08 MED ORDER — INSULIN ASPART 100 UNIT/ML ~~LOC~~ SOLN
0.0000 [IU] | Freq: Every day | SUBCUTANEOUS | Status: DC
  Administered 2017-09-11: 3 [IU] via SUBCUTANEOUS

## 2017-09-08 MED ORDER — SEVELAMER CARBONATE 800 MG PO TABS
2400.0000 mg | ORAL_TABLET | Freq: Three times a day (TID) | ORAL | Status: DC
  Administered 2017-09-08 – 2017-10-03 (×48): 2400 mg via ORAL
  Filled 2017-09-08 (×50): qty 3

## 2017-09-08 MED ORDER — CALCITRIOL 0.25 MCG PO CAPS
0.7500 ug | ORAL_CAPSULE | ORAL | Status: DC
  Administered 2017-09-09 – 2017-09-18 (×4): 0.75 ug via ORAL
  Filled 2017-09-08: qty 1
  Filled 2017-09-08 (×5): qty 3

## 2017-09-08 MED ORDER — SODIUM CHLORIDE 0.9% FLUSH: 3.0000 mL | INTRAVENOUS | Status: DC | PRN

## 2017-09-08 MED ORDER — SODIUM CHLORIDE 0.9 % IV SOLN: 250.0000 mL | INTRAVENOUS | Status: DC | PRN

## 2017-09-08 NOTE — ED Notes (Signed)
Dr Stark Jock given a copy of troponin results 6.48

## 2017-09-08 NOTE — ED Notes (Signed)
Pt. Return from XRAY via stretcher. 

## 2017-09-08 NOTE — Progress Notes (Addendum)
   Subjective: The patient was seen resting in his bed this morning. He stated that he is doing well and denied any chest pain or shortness of breath.   Objective:  Vital signs in last 24 hours: Vitals:   09/08/17 0245 09/08/17 0330 09/08/17 0345 09/08/17 0435  BP: 127/61 135/70 133/66   Pulse: 94 91 92 91  Resp: 20 (!) 21 (!) 22 20  Temp:    98.2 F (36.8 C)  TempSrc:    Oral  SpO2: 96% 97% 98% 99%  Weight:    217 lb (98.4 kg)  Height:    5\' 9"  (1.753 m)   Physical Exam  Constitutional: He appears well-developed and well-nourished.  Non-toxic appearance. He does not appear ill.  HENT:  Head: Normocephalic and atraumatic.  Cardiovascular: Normal rate, regular rhythm, normal heart sounds and intact distal pulses.  Pulmonary/Chest: Effort normal and breath sounds normal.  Abdominal: Soft. Bowel sounds are normal. He exhibits no distension. There is no tenderness.  Neurological: He is alert.  Psychiatric: He has a normal mood and affect. His behavior is normal. His mood appears not anxious. He is not agitated.    Assessment/Plan:  Active Problems:   Uncontrolled type 2 diabetes mellitus with end-stage renal disease ([place])   ESRD (end stage renal disease) ([place])   Hypertension due to end stage renal disease caused by type 2 diabetes mellitus, on dialysis (HCC)   Chronic combined systolic and diastolic CHF (congestive heart failure) (HCC)   Seizure, late effect of stroke (HCC)   Dyspnea  Dyspnea due to Acute on chronic combined systolic and diastolic congestive heart failure thought to be due to NSTEMI The patient presents with dyspnea and the cough for the past day.  The patient's BNP on admission was 4125 and the patient's chest x-ray showed findings of mild pulmonary vascular congestion and edema which cause concern for congestive heart failure. The patient's stat troponin was 6.48 which causes concern for ACS. Although troponins do rise in the setting of ESRD it is unlikely to  rise to this level.  The patient has a well score of 0 for DVT and therefore d-dimer is not being ordered as there is low suspicion.  -Trending troponins: 8.33 (9:40am) -Cardiology consulted due to uptrending nature of the troponins for possible catheterization -Pending echocardiogram-previous EF in September 2018 was 45-50% -Started heparin drip -Aspirin 325mg  given  End-stage renal disease Nephrology has been consulted. Patient being dialyzed today 09/08/17 and tomorrow -Continue Calcitrol 0.75 MCG Monday Wednesday Friday -Continue Cinacalcet at 120 mg daily -Continue sevelamer 2400 mg 3 times daily -Continue multivitamin  Diabetes mellitus type II -Lanus 10units qhs -3 units aspart with meals -SSI-sensitive  Essential hypertension Patient's blood pressure over the past 24 hours has ranged 126-144/59-70 -Continue metoprolol 50 mg twice daily  Coronary artery disease -Continue clopidogrel 75 mg daily -Aspirin 325mg  given -Continue atorvastatin 40 mg nightly  DVT FOY:DXAJOIN   Dispo: Anticipated discharge in approximately 0-1 day(s).  Lars Mage, MD Internal Medicine PGY1 [phone] 09/08/2017, 10:59 AM

## 2017-09-08 NOTE — H&P (Signed)
Date: 09/08/2017               Patient Name:  Ernest [name] MRN: [id number]  DOB: 02-24-1946 Age / Sex: 71 y.o., male   PCP: Patient, [name]         Medical Service: Internal Medicine Teaching Service         Attending Physician: Dr. Oval Linsey, MD    First Contact: Dr. Maricela Bo Pager: [phone]  Second Contact: Dr. Tiburcio Pea Pager: [phone]       After Hours (After 5p/  First Contact Pager: [phone]  weekends / holidays): Second Contact Pager: [phone]   Chief Complaint: "I am short of breath and have been coughing"  History of Present Illness: Ernest [name] is a 71 year old male who presents to the ED for shortness of breath and worsening cough for one day. He has a PMHx significant for HFrEF (45-50% in 2016), ESRD on HD MWF, DMII, gout, ED w/ implant, COPD, CAD, CVA w/ residual weakness and previous TIA. HE stated that his wife insisted he visit the hospital as he has been short of breath since his HD session the day prior. He denied significantly worsening SOB with activity but stated that he feels like he must work to breath while lying in bed upright. The patient denied chest pain or pressure of any degree, abdominal pain or back pain. The patient states that he has not experienced this same feeling for almost a year.  ROS, denied nausea, vomiting, chest pain, palpitations, muscle aches, swelling in his ankles or feet, diarrhea, constipation, fever, chills, production of sputum from his cough, or visual changes.   In the ED, CBC indicated leukocytosis of 11.7, Hgb 11.6 and plt's 145. CMP was remarkable for Na 134, Cr 10.39, and anion gap of 18. BNP of 4,125.9. Chest X-ray indicated increased pulmonary interstitial edema. Call was placed for admission by the IMTS team.   Meds:  No outpatient medications have been marked as taking for the 09/07/17 encounter [facility] Encounter).   Allergies: Allergies as of 09/07/2017  . ([name])   Past Medical History:    Diagnosis Date  . Abnormal stress test    s/p cath November 2013 with modest disease involving the ostial left main, proximal LAD, proximal RCA - do not appear to be hemodynamically signficant; mild LV dysfunction  . Bacteremia   . Chronic systolic CHF (congestive heart failure) ([place])   . COPD (chronic obstructive pulmonary disease) ([place])   . Coronary artery disease    per cath report 2013  . Ejection fraction < 50%   . Erectile dysfunction    penile implant  . ESRD (end stage renal disease) ([place])    Started HD in [place] in 2009, ESRD was due to DM. Moved to [facility] in Dec 2009 and now gets dialysis at [facility] on a MWF schedule.     . Gout   . Gout    Hx: of  . Hepatitis C   . Hyperlipidemia   . Hypertension   . Obesity   . Retinopathy   . Shortness of breath    Hx: of with exertion  . Stroke ([place])   . Type II or unspecified type diabetes mellitus without mention of complication, not stated as uncontrolled    adult onset   Family History:  Family History  Problem Relation Age of Onset  . Heart disease Father   . Diabetes Sister   . Diabetes Brother   .  Diabetes Son    Social History:  Social History   Tobacco Use  . Smoking status: Former Smoker    Years: 7.00    Last attempt to quit: 06/10/1973    Years since quitting: 44.2  . Smokeless tobacco: Never Used  . Tobacco comment: Quit in 1974.  Substance Use Topics  . Alcohol use: No    Alcohol/week: 0.0 oz  . Drug use: No    Review of Systems: A complete ROS was negative except as per HPI.   Physical Exam: Blood pressure 133/66, pulse 92, temperature 98 F (36.7 C), temperature source Oral, resp. rate (!) 22, height 5\' 9"  (1.753 m), weight 220 lb (99.8 kg), SpO2 98 %. Physical Exam  Constitutional: He is oriented to person, place, and time. He appears well-developed.  Non-toxic appearance. He appears ill. No distress.  Neck: No hepatojugular reflux and no JVD present.  Cardiovascular: Normal rate, regular  rhythm, normal heart sounds and intact distal pulses. Exam reveals no friction rub.  No murmur heard. Pulmonary/Chest: Effort normal. He has rales in the right lower field and the left lower field. He exhibits no tenderness, no laceration and no crepitus.  Abdominal: Soft. Bowel sounds are normal. He exhibits distension. He exhibits no ascites. There is no tenderness. There is no rebound.  Musculoskeletal:       Right lower leg: He exhibits no edema (No edema appreciated ).       Left lower leg: He exhibits no edema (No edema appreciated ).  Neurological: He is alert and oriented to person, place, and time.  Skin: Capillary refill takes less than 2 seconds. No erythema.    EKG: personally reviewed my interpretation is that there is QT prolongation, PR interval prolongation as well as minor t-wave alterations in V4-V6 the latter of which is relatively unchanged from a study one year prior.  CXR: personally reviewed my interpretation is that the patient has mild pulmonary vascular congestion and interstitial edema. Enlarged cardiac silhouette   Assessment & Plan by Problem: Active Problems:   Uncontrolled type 2 diabetes mellitus with end-stage renal disease ([place])   ESRD (end stage renal disease) ([place])   Hypertension due to end stage renal disease caused by type 2 diabetes mellitus, on dialysis (HCC)   Chronic combined systolic and diastolic CHF (congestive heart failure) (HCC)   Seizure, late effect of stroke [facility])   Dyspnea  Ernest [name] is a 71 year old male who presents to the ED for SOB and cough. The differential diagnosis consisted of NSTEMI, PE, volume overload 2/2 ESRD, aortic dissection, pericarditis, COPD exacerbation, pericarditis, or acute on chronic CHF exacerbation. It was quickly narrowed to NSTEMI, PE, volume overload 2/2 renal/cardiac etiology given history, physical exam, EKG, imaging and lab values thus far.   Dyspnea: Thought to most likely be 2/2 ESRD volume  overload due to increased PO intake of high sodium content foods over the Thanksgiving holiday and subsequent days that followed. Concern for NSTEMI and PE are still high and as such CT angio was strongly considered and eventually decided on given risk of failure to treat a PE. In addition MI was still of concern prompting troponin trend to be continued as initial was 6.48. CHF was considered given imaging and BNP of 4,125.9 and symptoms.  -CT angiogram  -Troponin trend -ECHO cardiogram ordered  ESRD: Patient has a history of treatment in the ED in March of this year for a very similar presentation whereupon he was determined to  be volume overloaded 2/2 a shortened HD session for ESRD and treated with HD. He was stabilized and discharged home following the HD. -Nephrology consulted by ED physician who requested the patient be admitted to a primary service, they are planning HD in the morning -Repeat BMP in ~24 hours  Congestive Heart failure: BNP elevated to >4K. Could be 2/2 to renal disease as well.  Previous echo demonstrated EF 45-50% -Echo ordered, please follow-up -Restrict unnecessary fluids -Continue metoprolol tartrate 50mg  BID  DMII: History of DMII with long term insulin use -Basal Lantus 10 units QHS -SSI sensitive with night correction  -Last HgbA1c 10 months prior 8.7% -Repeat HgbA1c   HTN: Currently well controlled w/o medication. Not overtly hypertensive despite concern for volume overload -Will monitor and complete HD as appropriate. -Metoprolol for CHF   Seizure history: -Continue home dose of Keppra 1000mg   CAD: [place] cath in 2013, no stents placed Stent placed in arm, on clopidogrel  -Continue clopidogrel 75mg  daily -Continue ASA 81mg  daily -Continue atorvastatin 40mg  QHS  Diet: Renal carb modified DVT ppx: heparin 5000 units  Code: Full Fluids: Hold Dispo: Admit patient to Observation with expected length of stay less than 2  midnights.  Signed: Kathi Ludwig, MD 09/08/2017, 4:36 AM  Pager: Pager# [phone]

## 2017-09-08 NOTE — ED Provider Notes (Signed)
[facility] EMERGENCY DEPARTMENT Provider Note   CSN: [id number] Arrival date & time: 09/07/17  2334     History   Chief Complaint Chief Complaint  Patient presents with  . Shortness of Breath    HPI Ernest [name] is a 71 y.o. male.  Patient is a 71 year old male with past medical history of end-stage renal disease on hemodialysis, COPD, CHF presenting for evaluation of dyspnea.  This started 2 days ago and is worsening.  He denies any productive cough, fevers or chills.  He denies any leg swelling.  His last dialysis session was yesterday and normal in duration.  He was brought here by EMS where he was found to have oxygen saturations of 88% on room air.  This improved with nasal cannula.   The history is provided by the patient.  Shortness of Breath  This is a new problem. The average episode lasts 2 days. The problem occurs continuously.The problem has been rapidly worsening. Pertinent negatives include no fever, no rhinorrhea, no cough, no sputum production, no chest pain and no leg swelling. He has tried nothing for the symptoms. He has had prior hospitalizations.    Past Medical History:  Diagnosis Date  . Abnormal stress test    s/p cath November 2013 with modest disease involving the ostial left main, proximal LAD, proximal RCA - do not appear to be hemodynamically signficant; mild LV dysfunction  . Bacteremia   . Chronic systolic CHF (congestive heart failure) ([place])   . COPD (chronic obstructive pulmonary disease) ([place])   . Coronary artery disease    per cath report 2013  . Ejection fraction < 50%   . Erectile dysfunction    penile implant  . ESRD (end stage renal disease) ([place])    Started HD in [place] in 2009, ESRD was due to DM. Moved to [facility] in Dec 2009 and now gets dialysis at [facility] on a MWF schedule.     . Gout   . Gout    Hx: of  . Hepatitis C   . Hyperlipidemia   . Hypertension   . Obesity   . Retinopathy   . Shortness of  breath    Hx: of with exertion  . Stroke ([place])   . Type II or unspecified type diabetes mellitus without mention of complication, not stated as uncontrolled    adult onset    Patient Active Problem List   Diagnosis Date Noted  . Left leg weakness   . TIA (transient ischemic attack) 10/27/2016  . History of CVA with residual deficit 08/07/2015  . Seizure, late effect of stroke ([place]) 08/07/2015  . Diabetic peripheral neuropathy ([place]) 08/07/2015  . Obstructive sleep apnea 06/16/2013  . Hyperlipidemia associated with type 2 diabetes mellitus ([place])   . ESRD (end stage renal disease) ([place])   . Erectile dysfunction associated with type 2 diabetes mellitus ([place])   . Hypertension due to end stage renal disease caused by type 2 diabetes mellitus, on dialysis ([place])   . COPD (chronic obstructive pulmonary disease) ([place])   . Coronary artery disease   . Chronic combined systolic and diastolic CHF (congestive heart failure) ([place])   . Gout, unspecified 02/28/2009  . Uncontrolled type 2 diabetes mellitus with end-stage renal disease ([place]) 06/08/1984    Past Surgical History:  Procedure Laterality Date  . AV FISTULA PLACEMENT Left 01/13/2013   Procedure: INSERTION OF ARTERIOVENOUS (AV) GORE-TEX GRAFT ARM;  Surgeon: Angelia Mould, MD;  Location: [facility]  OR;  Service: Vascular;  Laterality: Left;  . AV FISTULA PLACEMENT Left 05/05/2013   Procedure: INSERTION OF LEFT UPPER ARM  ARTERIOVENOUS GORTEX GRAFT;  Surgeon: Angelia Mould, MD;  Location: [facility];  Service: Vascular;  Laterality: Left;  . [place] REMOVAL Left 03/26/2013   Procedure: REMOVAL OF  NON INCORPORATED ARTERIOVENOUS GORETEX GRAFT ([place]) left arm * repair of  left brachial artery with vein patch angioplasty.;  Surgeon: Angelia Mould, MD;  Location: [facility];  Service: Vascular;  Laterality: Left;  . CARDIAC CATHETERIZATION  August 26, 2012  . CATARACT EXTRACTION W/ INTRAOCULAR LENS  IMPLANT, BILATERAL    . COLONOSCOPY    .  EMBOLECTOMY Right 08/27/2013   Procedure: EMBOLECTOMY;  Surgeon: Mal Misty, MD;  Location: [place];  Service: Vascular;  Laterality: Right;  Thrombectomy of Radial and ulnar artery.  [name] EYE SURGERY     laser.  and surgery for DM  . fistula     RUE and wrist  . INSERTION OF DIALYSIS CATHETER Right 01/01/2013   Procedure: INSERTION OF DIALYSIS CATHETER right  internal jugular;  Surgeon: Serafina Mitchell, MD;  Location: [place];  Service: Vascular;  Laterality: Right;  . INSERTION OF DIALYSIS CATHETER N/A 03/26/2013   Procedure: INSERTION OF DIALYSIS CATHETER Left internal jugular vein;  Surgeon: Angelia Mould, MD;  Location: [place];  Service: Vascular;  Laterality: N/A;  . LIGATION OF ARTERIOVENOUS  FISTULA Right 12/30/2013   Procedure: REMOVAL OF SEGMENT OF GORTEX GRAFT AND FISTULA  AND REPAIR OF BRACHIAL ARTERY;  Surgeon: Mal Misty, MD;  Location: [place];  Service: Vascular;  Laterality: Right;  . PENILE PROSTHESIS IMPLANT     1997.. no card  . TEE WITHOUT CARDIOVERSION N/A 06/11/2013   Procedure: TRANSESOPHAGEAL ECHOCARDIOGRAM (TEE);  Surgeon: Larey Dresser, MD;  Location: [place];  Service: Cardiovascular;  Laterality: N/A;  . THROMBECTOMY W/ EMBOLECTOMY Left 03/26/2013   Procedure: Attempted thrombectomy of left arm arteriovenous goretex graft.;  Surgeon: Angelia Mould, MD;  Location: [place];  Service: Vascular;  Laterality: Left;  [name] VENOGRAM N/A 04/07/2013   Procedure: VENOGRAM;  Surgeon: Serafina Mitchell, MD;  Location: [facility] CATH LAB;  Service: Cardiovascular;  Laterality: N/A;       Home Medications    Prior to Admission medications   Medication Sig Start Date End Date Taking? Authorizing Provider  ACCU-CHEK AVIVA PLUS test strip USE ONE STRIP TO CHECK GLUCOSE 4 TIMES DAILY BEFORE  MEALS  AND  BEDTIME. 01/14/17   Rivet, Sindy Guadeloupe, MD  allopurinol (ZYLOPRIM) 100 MG tablet Take 1 tablet (100 mg total) by mouth daily. 04/16/16   Rivet, Sindy Guadeloupe, MD  amitriptyline (ELAVIL) 25  MG tablet TAKE ONE TABLET BY MOUTH AT BEDTIME FOR  NEUROPATHY 04/16/16   Rivet, Sindy Guadeloupe, MD  aspirin EC 81 MG tablet Take 1 tablet (81 mg total) by mouth daily. 11/29/15   Rivet, Sindy Guadeloupe, MD  atorvastatin (LIPITOR) 40 MG tablet Take 1 tablet (40 mg total) by mouth daily. 11/29/15   Rivet, Sindy Guadeloupe, MD  cinacalcet (SENSIPAR) 60 MG tablet Take 1 tablet (60 mg total) by mouth 2 (two) times daily. Patient taking differently: Take 60 mg by mouth daily.  11/29/15   Rivet, Sindy Guadeloupe, MD  clopidogrel (PLAVIX) 75 MG tablet Take 1 tablet (75 mg total) by mouth daily. 11/29/15   Rivet, Sindy Guadeloupe, MD  colchicine 0.6 MG tablet 1 per day on Monday and Friday 04/16/16  Rivet, Sindy Guadeloupe, MD  insulin glargine (LANTUS) 100 UNIT/ML injection Inject 0.1 mLs (10 Units total) into the skin at bedtime. 02/12/17   Rivet, Sindy Guadeloupe, MD  insulin lispro (HUMALOG) 100 UNIT/ML KiwkPen Inject 0.03 mLs (3 Units total) into the skin 3 (three) times daily with meals. 11/29/15   Rivet, Sindy Guadeloupe, MD  levETIRAcetam (KEPPRA) 500 MG tablet Take 2 tablets (1,000 mg total) by mouth daily. Then take extra 500 mg (1 tablet) on MWF after HD 11/29/15   Rivet, Sindy Guadeloupe, MD  metoprolol tartrate (LOPRESSOR) 25 MG tablet Take 1 tablet (25 mg total) by mouth 2 (two) times daily. Patient taking differently: Take 50 mg by mouth daily.  11/29/15   Rivet, Sindy Guadeloupe, MD  metoprolol tartrate (LOPRESSOR) 50 MG tablet Take 50 mg by mouth 2 (two) times daily.    [provider]  multivitamin (RENA-VIT) TABS tablet Take 1 tablet by mouth daily. 11/29/15   Rivet, Sindy Guadeloupe, MD  sevelamer carbonate (RENVELA) 800 MG tablet Take 1 tablet (800 mg total) by mouth 3 (three) times daily with meals. Patient taking differently: Take 3,200 mg by mouth 3 (three) times daily with meals.  11/29/15   Rivet, Sindy Guadeloupe, MD  sevelamer carbonate (RENVELA) 800 MG tablet Take 2,400 mg by mouth 3 (three) times daily with meals.    [provider]    Family History Family History  Problem  Relation Age of Onset  . Heart disease Father   . Diabetes Sister   . Diabetes Brother   . Diabetes Son     Social History Social History   Tobacco Use  . Smoking status: Former Smoker    Years: 7.00    Last attempt to quit: 06/10/1973    Years since quitting: 44.2  . Smokeless tobacco: Never Used  . Tobacco comment: Quit in 1974.  Substance Use Topics  . Alcohol use: No    Alcohol/week: 0.0 oz  . Drug use: No     Allergies   Patient has no known allergies.   Review of Systems Review of Systems  Constitutional: Negative for fever.  HENT: Negative for rhinorrhea.   Respiratory: Positive for shortness of breath. Negative for cough and sputum production.   Cardiovascular: Negative for chest pain and leg swelling.  All other systems reviewed and are negative.    Physical Exam Updated Vital Signs BP (!) 144/71   Pulse 99   Temp 98 F (36.7 C) (Oral)   Ht 5\' 9"  (1.753 m)   Wt 99.8 kg (220 lb)   SpO2 98%   BMI 32.49 kg/m   Physical Exam  Constitutional: He is oriented to person, place, and time. He appears well-developed and well-nourished. No distress.  Patient is a chronically ill-appearing male.  HENT:  Head: Normocephalic and atraumatic.  Mouth/Throat: Oropharynx is clear and moist.  Neck: Normal range of motion. Neck supple.  Cardiovascular: Normal rate and regular rhythm. Exam reveals no friction rub.  No murmur heard. Pulmonary/Chest: Effort normal. No respiratory distress. He has no wheezes. He has rales in the right lower field and the left lower field.  There are rales in the bases bilaterally.  He does have mild respiratory distress.  Abdominal: Soft. Bowel sounds are normal. He exhibits no distension. There is no tenderness.  Musculoskeletal: Normal range of motion. He exhibits no edema.       Right lower leg: He exhibits no edema.       Left lower leg: He exhibits  no edema.  Neurological: He is alert and oriented to person, place, and time.  Coordination normal.  Skin: Skin is warm and dry. He is not diaphoretic.  Nursing note and vitals reviewed.    ED Treatments / Results  Labs (all labs ordered are listed, but only abnormal results are displayed) Labs Reviewed  COMPREHENSIVE METABOLIC PANEL  BRAIN NATRIURETIC PEPTIDE  CBC WITH DIFFERENTIAL/PLATELET  I-STAT TROPONIN, ED    EKG  EKG Interpretation  Date/Time:  Saturday September 07 2017 23:44:34 EST Ventricular Rate:  98 PR Interval:  186 QRS Duration: 114 QT Interval:  382 QTC Calculation: 487 R Axis:     Text Interpretation:  Normal sinus rhythm ST & T wave abnormality, consider inferolateral ischemia Prolonged QT Abnormal ECG Confirmed by Veryl Speak [phone]) on 09/07/2017 11:54:51 PM       Radiology No results found.  Procedures Procedures (including critical care time)  Medications Ordered in ED Medications  albuterol (PROVENTIL) (2.5 MG/3ML) 0.083% nebulizer solution 5 mg (5 mg Nebulization Given 09/07/17 2349)     Initial Impression / Assessment and Plan / ED Course  I have reviewed the triage vital signs and the nursing notes.  Pertinent labs & imaging results that were available during my care of the patient were reviewed by me and considered in my medical decision making (see chart for details).  Patient with history of end-stage renal disease presenting with complaints of shortness of breath.  He was last dialyzed Friday.  He was initially found to be hypoxic with oxygen saturations of 88%.  His workup would indicate pulmonary edema/volume overload as the etiology.  He was found to have a significantly elevated troponin, but because of which I am uncertain.  He is not complaining of any chest pain and his EKG is unchanged, however I do feel as though this should be observed more closely.  I have discussed the patient's care with Dr. Lorrene Reid from nephrology who is recommending admission and will see about having him dialyzed in the morning.   Patient will be admitted to the internal medicine teaching service.  Final Clinical Impressions(s) / ED Diagnoses   Final diagnoses:  None    ED Discharge Orders    None       Veryl Speak, MD 09/08/17 [phone]

## 2017-09-08 NOTE — Progress Notes (Signed)
ANTICOAGULATION CONSULT NOTE - Initial Consult  Pharmacy Consult for Heparin  Indication: chest pain/ACS  [name]  Patient Measurements: Height: 5\' 9"  (175.3 cm) Weight: 216 lb 14.9 oz (98.4 kg) IBW/kg (Calculated) : 70.7 Heparin Dosing Weight: 91 kg  Vital Signs: Temp: 98.2 F (36.8 C) (11/25 1045) Temp Source: Oral (11/25 1045) BP: 138/111 (11/25 1200) Pulse Rate: 95 (11/25 1200)  Labs: Recent Labs    09/08/17 0046 09/08/17 0939  HGB 11.6*  --   HCT 34.8*  --   PLT 145*  --   CREATININE 10.39*  --   TROPONINI  --  8.33*    Estimated Creatinine Clearance: 7.9 mL/min (A) (by C-G formula based on SCr of 10.39 mg/dL (H)).   Medical History: Past Medical History:  Diagnosis Date  . Abnormal stress test    s/p cath November 2013 with modest disease involving the ostial left main, proximal LAD, proximal RCA - do not appear to be hemodynamically signficant; mild LV dysfunction  . Bacteremia   . Chronic systolic CHF (congestive heart failure) ([place])   . COPD (chronic obstructive pulmonary disease) ([place])   . Coronary artery disease    per cath report 2013  . Ejection fraction < 50%   . Erectile dysfunction    penile implant  . ESRD (end stage renal disease) ([place])    Started HD in [place] in 2009, ESRD was due to DM. Moved to [facility] in Dec 2009 and now gets dialysis at [facility] on a MWF schedule.     . Gout   . Gout    Hx: of  . Hepatitis C   . Hyperlipidemia   . Hypertension   . Obesity   . Retinopathy   . Shortness of breath    Hx: of with exertion  . Stroke ([place])   . Type II or unspecified type diabetes mellitus without mention of complication, not stated as uncontrolled    adult onset    Medications:  Scheduled:  . aspirin EC  81 mg Oral Daily  . aspirin  325 mg Oral Once  . atorvastatin  40 mg Oral Daily  . [START ON 09/09/2017] calcitRIOL  0.75 mcg Oral Q M,W,F-HD  . cinacalcet  120 mg Oral Q supper  . clopidogrel  75 mg Oral Daily  .  heparin  4,000 Units Intravenous Once  . insulin aspart  0-5 Units Subcutaneous QHS  . insulin aspart  0-9 Units Subcutaneous TID WC  . insulin aspart  3 Units Subcutaneous TID WC  . insulin glargine  10 Units Subcutaneous QHS  . levETIRAcetam  1,000 mg Oral Daily  . metoprolol tartrate  50 mg Oral BID  . multivitamin  1 tablet Oral Daily  . sevelamer carbonate  2,400 mg Oral TID WC    Assessment: 89 yom with history of combined CHF (EF 45-50% 06/2017), CAD, HTN, DM, and ESRD presents with dyspnea. BNP 4125, CXR showed mild pulmonary vascular congestion and edema. Trop 8.33. Cards consulted. Pharmacy consulted to dose heparin for ACS.   No PTA anticoagulation, CBC okay, no bleeding noted.   Goal of Therapy:  INR 2-3 Monitor platelets by anticoagulation protocol: Yes   Plan:  Heparin 4000 units bolus x 1 Heparin 1200 gtt units/hr  6-hr heparin level Daily heparin level and CBC  Leroy Libman, PharmD Pharmacy Resident Pager: [phone]

## 2017-09-08 NOTE — Progress Notes (Signed)
ANTICOAGULATION CONSULT NOTE - Follow Up Consult  Pharmacy Consult for Heparin Indication: chest pain/ACS  [name]  Patient Measurements: Height: 5\' 9"  (175.3 cm) Weight: 209 lb 14.1 oz (95.2 kg) IBW/kg (Calculated) : 70.7 Heparin Dosing Weight: 90kg  Vital Signs: Temp: 98.8 F (37.1 C) (11/25 1500) Temp Source: Oral (11/25 1500) BP: 86/54 (11/25 1500) Pulse Rate: 100 (11/25 1500)  Labs: Recent Labs    09/08/17 0046 09/08/17 0939 09/08/17 1221 09/08/17 1901  HGB 11.6*  --   --   --   HCT 34.8*  --   --   --   PLT 145*  --   --   --   HEPARINUNFRC  --   --   --  0.31  CREATININE 10.39*  --   --   --   TROPONINI  --  8.33* 10.11*  --     Estimated Creatinine Clearance: 7.7 mL/min (A) (by C-G formula based on SCr of 10.39 mg/dL (H)).   Medications:  Heparin @ 1200 units/hr  Assessment: 70yom started on heparin earlier today for NSTEMI. Initial heparin level is therapeutic at 0.31. Noted plan for cath tomorrow.  Goal of Therapy:  Heparin level 0.3-0.7 units/ml Monitor platelets by anticoagulation protocol: Yes   Plan:  1) Continue heparin at 1200 units/hr 2) Confirmatory heparin level with AM labs  Deboraha Sprang 09/08/2017,7:59 PM

## 2017-09-08 NOTE — Progress Notes (Signed)
Internal Medicine Attending  Date: 09/08/2017  Patient name: Ernest [name] Medical record number: [id number] Date of birth: [birth year] Age: 71 y.o. Gender: male  I saw and evaluated the patient. I reviewed the resident's note by Dr. Maricela Bo and I agree with the resident's findings and plans as documented in her progress note.  Please see my H&P dated 09/08/2017 and attached to Dr. Nelma Rothman H&P dated 09/08/2017 for the specifics of my evaluation, assessment, and plan from earlier in the day.

## 2017-09-08 NOTE — ED Notes (Signed)
Pt. To XRAY via stretcher. 

## 2017-09-08 NOTE — ED Notes (Signed)
Spoke with MD about current troponin. Will continue to monitor.

## 2017-09-08 NOTE — Progress Notes (Signed)
[facility] Brief Note  Notified of pt admission DM, HTN, ESRD HD [place] Last HD 09/06/17 EDW 95.5, got to 95.6 kg (only got 3 hours 40 min) Present w/SOB, pulm edema, + trop 6.5  Plan for HD later this AM  HD Rx 4.25 hours/450/800 2K2Ca EDW 95.5 R AVG ? No anticoagulant Calcitriol 0.75 Venofer 50/week Sensipar 120/day  Jamal Maes, MD [facility] [phone] Pager 09/08/2017, 2:37 AM

## 2017-09-08 NOTE — Consult Note (Signed)
[facility] Consultation Note Requesting Physician:  EDP Primary Nephrologist: Joelyn Oms [facility]) Reason for Consult:  ESRD, pulmonary edema  HPI: The patient is a 71 y.o. year-old AAM with PMH HTN, DM2, seizure history, HLD, COPD, systolic and diastolic HF (ECHO 7124 EF 45-50%, Grade 1 DD). Receives HD MWF [facility]. Attended treatment 11/23, 3.9 kg of fluid removed, did have BP drop into the 80's. Post HD weight was 95.6 kg with EDW of 95.5. Came to the ED for evaluation because of SOB post HD and since. In ED was hypoxemic (O2 sats 80's) improved on nasal cannula to 98%, CXR with vascular congestion, diffuse interstitial edema. Troponin elevated at 6.5. We are asked to see.   Denies CP, nausea, diaphoresis. States NOT usual for him to be SOB post treatment. Has not noticed any sig leg swelling.  Pt had heart cath 2013 (2/2 abnl stress test during transplant evaluation) with CAD nonobstructive at that time but with lesions LM, LAD, RCA. Last ECHO I can find was 06/2015 EF 45-50%.   Past Medical History:  Diagnosis Date  . Abnormal stress test    s/p cath November 2013 with modest disease involving the ostial left main, proximal LAD, proximal RCA - do not appear to be hemodynamically signficant; mild LV dysfunction  . Bacteremia   . Chronic systolic CHF (congestive heart failure) ([place])   . COPD (chronic obstructive pulmonary disease) ([place])   . Coronary artery disease    per cath report 2013  . Ejection fraction < 50%   . Erectile dysfunction    penile implant  . ESRD (end stage renal disease) ([place])    Started HD in [place] in 2009, ESRD was due to DM. Moved to [facility] in Dec 2009 and now gets dialysis at [facility] on a MWF schedule.     . Gout   . Gout    Hx: of  . Hepatitis C   . Hyperlipidemia   . Hypertension   . Obesity   . Retinopathy   . Shortness of breath    Hx: of with exertion  . Stroke ([place])   . Type II or unspecified type diabetes mellitus without  mention of complication, not stated as uncontrolled    adult onset    Past Surgical History:  Procedure Laterality Date  . AV FISTULA PLACEMENT Left 01/13/2013   Procedure: INSERTION OF ARTERIOVENOUS (AV) GORE-TEX GRAFT ARM;  Surgeon: Angelia Mould, MD;  Location: [place];  Service: Vascular;  Laterality: Left;  . AV FISTULA PLACEMENT Left 05/05/2013   Procedure: INSERTION OF LEFT UPPER ARM  ARTERIOVENOUS GORTEX GRAFT;  Surgeon: Angelia Mould, MD;  Location: [place];  Service: Vascular;  Laterality: Left;  . [place] REMOVAL Left 03/26/2013   Procedure: REMOVAL OF  NON INCORPORATED ARTERIOVENOUS GORETEX GRAFT ([place]) left arm * repair of  left brachial artery with vein patch angioplasty.;  Surgeon: Angelia Mould, MD;  Location: [place];  Service: Vascular;  Laterality: Left;  . CARDIAC CATHETERIZATION  August 26, 2012  . CATARACT EXTRACTION W/ INTRAOCULAR LENS  IMPLANT, BILATERAL    . COLONOSCOPY    . EMBOLECTOMY Right 08/27/2013   Procedure: EMBOLECTOMY;  Surgeon: Mal Misty, MD;  Location: [place];  Service: Vascular;  Laterality: Right;  Thrombectomy of Radial and ulnar artery.  [name] EYE SURGERY     laser.  and surgery for DM  . fistula     RUE and wrist  . INSERTION OF DIALYSIS  CATHETER Right 01/01/2013   Procedure: INSERTION OF DIALYSIS CATHETER right  internal jugular;  Surgeon: Serafina Mitchell, MD;  Location: [place];  Service: Vascular;  Laterality: Right;  . INSERTION OF DIALYSIS CATHETER N/A 03/26/2013   Procedure: INSERTION OF DIALYSIS CATHETER Left internal jugular vein;  Surgeon: Angelia Mould, MD;  Location: [place];  Service: Vascular;  Laterality: N/A;  . LIGATION OF ARTERIOVENOUS  FISTULA Right 12/30/2013   Procedure: REMOVAL OF SEGMENT OF GORTEX GRAFT AND FISTULA  AND REPAIR OF BRACHIAL ARTERY;  Surgeon: Mal Misty, MD;  Location: [place];  Service: Vascular;  Laterality: Right;  . PENILE PROSTHESIS IMPLANT     1997.. no card  . TEE WITHOUT CARDIOVERSION  N/A 06/11/2013   Procedure: TRANSESOPHAGEAL ECHOCARDIOGRAM (TEE);  Surgeon: Larey Dresser, MD;  Location: [place];  Service: Cardiovascular;  Laterality: N/A;  . THROMBECTOMY W/ EMBOLECTOMY Left 03/26/2013   Procedure: Attempted thrombectomy of left arm arteriovenous goretex graft.;  Surgeon: Angelia Mould, MD;  Location: [place];  Service: Vascular;  Laterality: Left;  [name] VENOGRAM N/A 04/07/2013   Procedure: VENOGRAM;  Surgeon: Serafina Mitchell, MD;  Location: [facility] CATH LAB;  Service: Cardiovascular;  Laterality: N/A;   Family History  Problem Relation Age of Onset  . Heart disease Father   . Diabetes Sister   . Diabetes Brother   . Diabetes Son    Social History:  reports that he quit smoking about 44 years ago. He quit after 7.00 years of use. he has never used smokeless tobacco. He reports that he does not drink alcohol or use drugs.  Allergies: [name]  Home medications: Prior to Admission medications   Medication Sig Start Date End Date Taking? Authorizing Provider  allopurinol (ZYLOPRIM) 100 MG tablet Take 1 tablet (100 mg total) by mouth daily. 04/16/16   Rivet, Sindy Guadeloupe, MD  amitriptyline (ELAVIL) 25 MG tablet TAKE ONE TABLET BY MOUTH AT BEDTIME FOR  NEUROPATHY 04/16/16   Rivet, Sindy Guadeloupe, MD  aspirin EC 81 MG tablet Take 1 tablet (81 mg total) by mouth daily. 11/29/15   Rivet, Sindy Guadeloupe, MD  atorvastatin (LIPITOR) 40 MG tablet Take 1 tablet (40 mg total) by mouth daily. 11/29/15   Rivet, Sindy Guadeloupe, MD  cinacalcet (SENSIPAR) 60 MG tablet Take 1 tablet (60 mg total) by mouth 2 (two) times daily. Patient taking differently: Take 60 mg by mouth daily.  11/29/15   Rivet, Sindy Guadeloupe, MD  clopidogrel (PLAVIX) 75 MG tablet Take 1 tablet (75 mg total) by mouth daily. 11/29/15   Rivet, Sindy Guadeloupe, MD  colchicine 0.6 MG tablet 1 per day on Monday and Friday 04/16/16   Rivet, Sindy Guadeloupe, MD  insulin glargine (LANTUS) 100 UNIT/ML injection Inject 0.1 mLs (10 Units total) into the skin at bedtime.  02/12/17   Rivet, Sindy Guadeloupe, MD  insulin lispro (HUMALOG) 100 UNIT/ML KiwkPen Inject 0.03 mLs (3 Units total) into the skin 3 (three) times daily with meals. 11/29/15   Rivet, Sindy Guadeloupe, MD  levETIRAcetam (KEPPRA) 500 MG tablet Take 2 tablets (1,000 mg total) by mouth daily. Then take extra 500 mg (1 tablet) on MWF after HD 11/29/15   Rivet, Sindy Guadeloupe, MD  metoprolol tartrate (LOPRESSOR) 25 MG tablet Take 1 tablet (25 mg total) by mouth 2 (two) times daily. Patient taking differently: Take 50 mg by mouth daily.  11/29/15   Rivet, Sindy Guadeloupe, MD  metoprolol tartrate (LOPRESSOR) 50 MG tablet Take 50 mg  by mouth 2 (two) times daily.    [provider]  multivitamin (RENA-VIT) TABS tablet Take 1 tablet by mouth daily. 11/29/15   Rivet, Sindy Guadeloupe, MD  sevelamer carbonate (RENVELA) 800 MG tablet Take 1 tablet (800 mg total) by mouth 3 (three) times daily with meals. Patient taking differently: Take 3,200 mg by mouth 3 (three) times daily with meals.  11/29/15   Rivet, Sindy Guadeloupe, MD  sevelamer carbonate (RENVELA) 800 MG tablet Take 2,400 mg by mouth 3 (three) times daily with meals.    [provider]    Inpatient medications: . aspirin EC  81 mg Oral Daily  . atorvastatin  40 mg Oral Daily  . cinacalcet  60 mg Oral Q breakfast  . clopidogrel  75 mg Oral Daily  . heparin subcutaneous  5,000 Units Subcutaneous Q8H  . insulin aspart  0-5 Units Subcutaneous QHS  . insulin aspart  0-9 Units Subcutaneous TID WC  . insulin aspart  3 Units Subcutaneous TID WC  . insulin glargine  10 Units Subcutaneous QHS  . levETIRAcetam  1,000 mg Oral Daily  . metoprolol tartrate  50 mg Oral BID  . multivitamin  1 tablet Oral Daily  . sevelamer carbonate  2,400 mg Oral TID WC    Review of Systems See HPI   Physical Exam:  Blood pressure 133/66, pulse 91, temperature 98.2 F (36.8 C), temperature source Oral, resp. rate 20, height 5\' 9"  (1.753 m), weight 98.4 kg (217 lb), SpO2 99 %.  Gen: Very nice bearded  AAM Some dyspnea with speaking (but O2 on incorrectly) + JVD Lungs basilar crackles bilaterally S1S2 No audible S3 or rum Abdomen obese, no focal tenderness Trace LE pitting edema Alert, oriented X3, very soft spoken  Dialysis Access: L upper arm AVG + bruit, extensive keloid formation   Recent Labs  Lab 09/08/17 0046  NA 134*  K 4.4  CL 94*  CO2 22  GLUCOSE 407*  BUN 71*  CREATININE 10.39*  CALCIUM 9.4    Recent Labs  Lab 09/08/17 0046  AST 25  ALT 13*  ALKPHOS 50  BILITOT 0.8  PROT 6.5  ALBUMIN 3.5    Recent Labs  Lab 09/08/17 0046  WBC 11.7*  NEUTROABS 9.6*  HGB 11.6*  HCT 34.8*  MCV 89.7  PLT 145*    Recent Labs  Lab 09/08/17 0434  GLUCAP 342*   Troponin (Point of Care Test) Recent Labs    09/08/17 0049  TROPIPOC 6.48*   Xrays/Other Studies: Dg Chest 2 View  Result Date: 09/08/2017 CLINICAL DATA:  Increasing shortness of breath over 2 days. Decreased oxygen saturation. EXAM: CHEST  2 VIEW COMPARISON:  12/15/2016 FINDINGS: Shallow inspiration. Cardiac enlargement with pulmonary vascular congestion. Interstitial pattern to the lungs likely representing edema. No blunting of costophrenic angles. No pneumothorax. No focal consolidation. IMPRESSION: Cardiac enlargement with pulmonary vascular congestion and interstitial edema. Electronically Signed   By: Lucienne Capers M.D.   On: 09/08/2017 00:27   Dialysis prescription: [place] MWF 4.25 hours 2K2Ca EDW 95.5 kg Venofer 50/week Calcitriol 0.75 TIW with HD No anticoagulation  Assessment/Recommendations  1. SOB - present SINCE pt completed his last HD treatment on 11/28 and achieved his EDW.  1. Although is over EDW now the fact that he was SOB after/since his last treatment (NOT usual for him) and after removal of 3.9 kg of fluid on 11/23 makes consideration of other etiologies more important (with trop 6.5 would minimally want ECHO and  some sort of ischemia evaluation)  2. (Pt had heart  cath 2013 (2/2 abnl stress test during transplant evaluation) with CAD nonobstructive at that time but with lesions LM, LAD, RCA. Last ECHO I can find was 06/2015 EF 45-50%) 3. I note CT angio planned to r/o PE 4. Will plan for HD today  2. ESRD - fluid overloaded by exam and CXR despite only 2 days since last HD.  1. Plan for HD today and again tomorrow, and try to get to/under EDW of 95.5 kg  3. Anemia - On weekly venofer at his center. No recent ESA need. Last Hb PTA was 12.1 and current is 11.6  4. Systolic and diastolic HF per SOB discussion  5. Secondary HPT  1. Sensipar dose should be 120/day, clarify binder (our records show [place]); calcitriol 0.75 with each HD MWF  6. DM - management per primary service  Jamal Maes,  MD [facility] [phone] pager 09/08/2017, 6:15 AM

## 2017-09-08 NOTE — Progress Notes (Signed)
  Echocardiogram 2D Echocardiogram has been performed.  Ernest [name] 09/08/2017, 4:16 PM

## 2017-09-08 NOTE — Consult Note (Addendum)
Cardiology Consultation:   Patient ID: VLADISLAV AXELSON; [id number]; [birth year]   Admit date: 09/07/2017 Date of Consult: 09/08/2017  Primary Care Provider: Patient, [name] Primary Cardiologist: previously Dr. Ron Parker / [name], last visit 2013 Primary Electrophysiologist:  N/A   Patient Profile:   Ernest [name] is a 71 y.o. male with a hx of CAD, chronic systolic HF, COPD, ESRD on HD MWF, HTN, HLD, DM II and h/o CVA who is being seen today for the evaluation of acute on chronic systolic HF and dialysis at the request of Dr. Eppie Gibson.  History of Present Illness:   Ernest [name] is a 71 y.o. male with a hx of CAD, chronic systolic HF, COPD, ESRD on HD MWF, HTN, HLD, DM II and h/o CVA. He has been seen by both [name] and Dr. Ron Parker in the past. He was initially screened for kidney transplant in 2013 via [place]. Myoview was abnormal on 07/31/2012. Cardiac catheterization performed on 08/26/2012 showed EF 50%, 50% ostial left main, 50-70% proximal LAD, 40% ostial ramus, 20% left circumflex, 50% RCA lesion. Since the cardiac catheterization findings do not correlate well with his Myoview study, and the patient was asymptomatic, therefore medical therapy was recommended. Interestingly, based on echocardiogram, his ejection fraction decreased from 60% on echocardiogram in September 2012 down to 30-35% by August 2014. Last echocardiogram obtained on 07/12/2015 showed EF 45-50%, grade 1 DD.   He has dialysis Monday Wednesday Friday, for the past few days, he has been having worsening shortness of breath. According to the patient, this is especially noticeable with exertion. He denies any chest pain. Eventually he sought medical attention at [facility] on 09/08/2017. Initial BNP was 4125.9. Up from BNP 1298 from 8 month ago. Creatinine was 10.39. Initial POC troponin was 6.48. Subsequent troponin I was 8.33. Cardiology was consulted for NSTEMI. He is currently undergoing  hemodialysis.   Past Medical History:  Diagnosis Date  . Abnormal stress test    s/p cath November 2013 with modest disease involving the ostial left main, proximal LAD, proximal RCA - do not appear to be hemodynamically signficant; mild LV dysfunction  . Bacteremia   . Chronic systolic CHF (congestive heart failure) ([place])   . COPD (chronic obstructive pulmonary disease) ([place])   . Coronary artery disease    per cath report 2013  . Ejection fraction < 50%   . Erectile dysfunction    penile implant  . ESRD (end stage renal disease) ([place])    Started HD in [place] in 2009, ESRD was due to DM. Moved to [facility] in Dec 2009 and now gets dialysis at [facility] on a MWF schedule.     . Gout   . Gout    Hx: of  . Hepatitis C   . Hyperlipidemia   . Hypertension   . Obesity   . Retinopathy   . Shortness of breath    Hx: of with exertion  . Stroke ([place])   . Type II or unspecified type diabetes mellitus without mention of complication, not stated as uncontrolled    adult onset    Past Surgical History:  Procedure Laterality Date  . AV FISTULA PLACEMENT Left 01/13/2013   Procedure: INSERTION OF ARTERIOVENOUS (AV) GORE-TEX GRAFT ARM;  Surgeon: Angelia Mould, MD;  Location: [place];  Service: Vascular;  Laterality: Left;  . AV FISTULA PLACEMENT Left 05/05/2013   Procedure: INSERTION OF LEFT UPPER ARM  ARTERIOVENOUS GORTEX GRAFT;  Surgeon: [name]  Scot Dock, MD;  Location: [place];  Service: Vascular;  Laterality: Left;  . [place] REMOVAL Left 03/26/2013   Procedure: REMOVAL OF  NON INCORPORATED ARTERIOVENOUS GORETEX GRAFT ([place]) left arm * repair of  left brachial artery with vein patch angioplasty.;  Surgeon: Angelia Mould, MD;  Location: [place];  Service: Vascular;  Laterality: Left;  . CARDIAC CATHETERIZATION  August 26, 2012  . CATARACT EXTRACTION W/ INTRAOCULAR LENS  IMPLANT, BILATERAL    . COLONOSCOPY    . EMBOLECTOMY Right 08/27/2013   Procedure: EMBOLECTOMY;  Surgeon: Mal Misty, MD;  Location: [place];  Service: Vascular;  Laterality: Right;  Thrombectomy of Radial and ulnar artery.  [name] EYE SURGERY     laser.  and surgery for DM  . fistula     RUE and wrist  . INSERTION OF DIALYSIS CATHETER Right 01/01/2013   Procedure: INSERTION OF DIALYSIS CATHETER right  internal jugular;  Surgeon: Serafina Mitchell, MD;  Location: [place];  Service: Vascular;  Laterality: Right;  . INSERTION OF DIALYSIS CATHETER N/A 03/26/2013   Procedure: INSERTION OF DIALYSIS CATHETER Left internal jugular vein;  Surgeon: Angelia Mould, MD;  Location: [place];  Service: Vascular;  Laterality: N/A;  . LIGATION OF ARTERIOVENOUS  FISTULA Right 12/30/2013   Procedure: REMOVAL OF SEGMENT OF GORTEX GRAFT AND FISTULA  AND REPAIR OF BRACHIAL ARTERY;  Surgeon: Mal Misty, MD;  Location: [place];  Service: Vascular;  Laterality: Right;  . PENILE PROSTHESIS IMPLANT     1997.. no card  . TEE WITHOUT CARDIOVERSION N/A 06/11/2013   Procedure: TRANSESOPHAGEAL ECHOCARDIOGRAM (TEE);  Surgeon: Larey Dresser, MD;  Location: [place];  Service: Cardiovascular;  Laterality: N/A;  . THROMBECTOMY W/ EMBOLECTOMY Left 03/26/2013   Procedure: Attempted thrombectomy of left arm arteriovenous goretex graft.;  Surgeon: Angelia Mould, MD;  Location: [place];  Service: Vascular;  Laterality: Left;  [name] VENOGRAM N/A 04/07/2013   Procedure: VENOGRAM;  Surgeon: Serafina Mitchell, MD;  Location: [facility] CATH LAB;  Service: Cardiovascular;  Laterality: N/A;     Home Medications:  Prior to Admission medications   Medication Sig Start Date End Date Taking? Authorizing Provider  allopurinol (ZYLOPRIM) 100 MG tablet Take 1 tablet (100 mg total) by mouth daily. 04/16/16   Rivet, Sindy Guadeloupe, MD  amitriptyline (ELAVIL) 25 MG tablet TAKE ONE TABLET BY MOUTH AT BEDTIME FOR  NEUROPATHY 04/16/16   Rivet, Sindy Guadeloupe, MD  aspirin EC 81 MG tablet Take 1 tablet (81 mg total) by mouth daily. 11/29/15   Rivet, Sindy Guadeloupe, MD  atorvastatin (LIPITOR) 40  MG tablet Take 1 tablet (40 mg total) by mouth daily. 11/29/15   Rivet, Sindy Guadeloupe, MD  cinacalcet (SENSIPAR) 60 MG tablet Take 1 tablet (60 mg total) by mouth 2 (two) times daily. Patient taking differently: Take 60 mg by mouth daily.  11/29/15   Rivet, Sindy Guadeloupe, MD  clopidogrel (PLAVIX) 75 MG tablet Take 1 tablet (75 mg total) by mouth daily. 11/29/15   Rivet, Sindy Guadeloupe, MD  colchicine 0.6 MG tablet 1 per day on Monday and Friday 04/16/16   Rivet, Sindy Guadeloupe, MD  insulin glargine (LANTUS) 100 UNIT/ML injection Inject 0.1 mLs (10 Units total) into the skin at bedtime. 02/12/17   Rivet, Sindy Guadeloupe, MD  insulin lispro (HUMALOG) 100 UNIT/ML KiwkPen Inject 0.03 mLs (3 Units total) into the skin 3 (three) times daily with meals. 11/29/15   Rivet, Sindy Guadeloupe, MD  levETIRAcetam (KEPPRA) 500  MG tablet Take 2 tablets (1,000 mg total) by mouth daily. Then take extra 500 mg (1 tablet) on MWF after HD 11/29/15   Rivet, Sindy Guadeloupe, MD  metoprolol tartrate (LOPRESSOR) 25 MG tablet Take 1 tablet (25 mg total) by mouth 2 (two) times daily. Patient taking differently: Take 50 mg by mouth daily.  11/29/15   Rivet, Sindy Guadeloupe, MD  metoprolol tartrate (LOPRESSOR) 50 MG tablet Take 50 mg by mouth 2 (two) times daily.    [provider]  multivitamin (RENA-VIT) TABS tablet Take 1 tablet by mouth daily. 11/29/15   Rivet, Sindy Guadeloupe, MD  sevelamer carbonate (RENVELA) 800 MG tablet Take 1 tablet (800 mg total) by mouth 3 (three) times daily with meals. Patient taking differently: Take 3,200 mg by mouth 3 (three) times daily with meals.  11/29/15   Rivet, Sindy Guadeloupe, MD  sevelamer carbonate (RENVELA) 800 MG tablet Take 2,400 mg by mouth 3 (three) times daily with meals.    [provider]    Inpatient Medications: Scheduled Meds: . aspirin EC  81 mg Oral Daily  . atorvastatin  40 mg Oral Daily  . [START ON 09/09/2017] calcitRIOL  0.75 mcg Oral Q M,W,F-HD  . cinacalcet  120 mg Oral Q supper  . clopidogrel  75 mg Oral Daily  . insulin  aspart  0-5 Units Subcutaneous QHS  . insulin aspart  0-9 Units Subcutaneous TID WC  . insulin aspart  3 Units Subcutaneous TID WC  . insulin glargine  10 Units Subcutaneous QHS  . levETIRAcetam  1,000 mg Oral Daily  . metoprolol tartrate  50 mg Oral BID  . multivitamin  1 tablet Oral Daily  . sevelamer carbonate  2,400 mg Oral TID WC   Continuous Infusions: . heparin 1,200 Units/hr (09/08/17 1241)   PRN Meds:   Allergies:   [name]  Social History:   Social History   Socioeconomic History  . Marital status: Married    Spouse name: Not on file  . Number of children: Not on file  . Years of education: Not on file  . Highest education level: Not on file  Social Needs  . Financial resource strain: Not on file  . Food insecurity - worry: Not on file  . Food insecurity - inability: Not on file  . Transportation needs - medical: Not on file  . Transportation needs - non-medical: Not on file  Occupational History  . Occupation: Cabin crew: DISABLED    Comment: disabled  Tobacco Use  . Smoking status: Former Smoker    Years: 7.00    Last attempt to quit: 06/10/1973    Years since quitting: 44.2  . Smokeless tobacco: Never Used  . Tobacco comment: Quit in 1974.  Substance and Sexual Activity  . Alcohol use: No    Alcohol/week: 0.0 oz  . Drug use: No  . Sexual activity: Not on file  Other Topics Concern  . Not on file  Social History Narrative   Adopted mentally retarded child at home, 2 adopted children, 3 living and 2 deceased.    Family History:    Family History  Problem Relation Age of Onset  . Heart disease Father   . Diabetes Sister   . Diabetes Brother   . Diabetes Son      ROS:  Please see the history of present illness.  ROS  All other ROS reviewed and negative.     Physical Exam/Data:   Vitals:  09/08/17 1105 09/08/17 1130 09/08/17 1200 09/08/17 1230  BP: 107/81 (!) 118/99 (!) 138/111 (!) 131/110  Pulse: 93 92 95 93   Resp:      Temp:      TempSrc:      SpO2:      Weight:      Height:        Intake/Output Summary (Last 24 hours) at 09/08/2017 1257 Last data filed at 09/08/2017 0900 Gross per 24 hour  Intake 180 ml  Output -  Net 180 ml   Filed Weights   09/07/17 2340 09/08/17 0435 09/08/17 1045  Weight: 220 lb (99.8 kg) 217 lb (98.4 kg) 216 lb 14.9 oz (98.4 kg)   Body mass index is 32.04 kg/m.  General:  Frail, weak HEENT: normal Lymph: no adenopathy Neck: no JVD Endocrine:  No thryomegaly Vascular: No carotid bruits Left arm AV fistula, undergoing hemodialysis Cardiac:  normal S1, S2; RRR; no murmur  Lungs:  Clear anteriorly Abd: soft, nontender, no hepatomegaly  Ext: no edema Musculoskeletal:  No deformities, BUE and BLE strength normal and equal Skin: warm and dry  Neuro:  CNs 2-12 intact, no focal abnormalities noted Psych:  Normal affect   EKG:  The EKG was personally reviewed and demonstrates:   Telemetry:  Telemetry was personally reviewed and demonstrates:  NSR with occasional bigeminy   Relevant CV Studies:  Cath 08/26/2012 Left mainstem: There is a 50% eccentric ostial left main stenosis.  Left anterior descending (LAD): The LAD is heavily calcified throughout the proximal vessel. There is 50-70% stenosis in the proximal vessel followed by segment of 40% disease in the proximal to mid vessel.  The ramus intermediate branch is moderate in size and has mild disease at the ostium up to 30%.  Left circumflex (LCx): The left circumflex has minor irregularities up to 20%.  Right coronary artery (RCA): The right coronary has a 50% stenosis in the proximal vessel. The remainder of the vessel is without significant disease.  Left ventriculography: There is apical hypokinesis with mild left ventricular dysfunction. Ejection fraction is estimated at 50%.  Final Conclusions:   1. There is modest disease involving the ostial left main, proximal LAD and proximal right  coronary. These lesions do not appear to be hemodynamically significant. 2. Mild left ventricular dysfunction with apical hypokinesis.  Recommendations: Cardiac catheterization findings do not correlate well with his Myoview study. His ejection fraction looks better angiographically than 40%. Patient has been asymptomatic. I would recommend medical management.    Echo 07/12/2015 LV EF: 45% -   50%   Study Conclusions  - Left ventricle: The cavity size was normal. There was severe   concentric hypertrophy. Systolic function was mildly reduced. The   estimated ejection fraction was in the range of 45% to 50%. Mild   hypokinesis of the basal to mid anterior, anteroseptal and   inferoseptal walls. Doppler parameters are consistent with   abnormal left ventricular relaxation (grade 1 diastolic   dysfunction). - Aortic valve: Transvalvular velocity was within the normal range.   There was no stenosis. There was no regurgitation. - Mitral valve: There was trivial regurgitation. - Left atrium: The atrium was mildly dilated. - Right ventricle: The cavity size was normal. Wall thickness was   normal. Systolic function was normal.  Laboratory Data:  Chemistry Recent Labs  Lab 09/08/17 0046  NA 134*  K 4.4  CL 94*  CO2 22  GLUCOSE 407*  BUN 71*  CREATININE 10.39*  CALCIUM  9.4  GFRNONAA 4*  GFRAA 5*  ANIONGAP 18*    Recent Labs  Lab 09/08/17 0046  PROT 6.5  ALBUMIN 3.5  AST 25  ALT 13*  ALKPHOS 50  BILITOT 0.8   Hematology Recent Labs  Lab 09/08/17 0046  WBC 11.7*  RBC 3.88*  HGB 11.6*  HCT 34.8*  MCV 89.7  MCH 29.9  MCHC 33.3  RDW 14.7  PLT 145*   Cardiac Enzymes Recent Labs  Lab 09/08/17 0939  TROPONINI 8.33*    Recent Labs  Lab 09/08/17 0049  TROPIPOC 6.48*    BNP Recent Labs  Lab 09/08/17 0046  BNP 4,125.9*    DDimer No results for input(s): DDIMER in the last 168 hours.  Radiology/Studies:  Dg Chest 2 View  Result Date:  09/08/2017 CLINICAL DATA:  Increasing shortness of breath over 2 days. Decreased oxygen saturation. EXAM: CHEST  2 VIEW COMPARISON:  12/15/2016 FINDINGS: Shallow inspiration. Cardiac enlargement with pulmonary vascular congestion. Interstitial pattern to the lungs likely representing edema. No blunting of costophrenic angles. No pneumothorax. No focal consolidation. IMPRESSION: Cardiac enlargement with pulmonary vascular congestion and interstitial edema. Electronically Signed   By: Lucienne Capers M.D.   On: 09/08/2017 00:27    Assessment and Plan:   1. ACS: EKG consistent with ST depression in inferolateral leads. He has known disease in the left main, LAD and RCA. Last cardiac catheterization in November 2013. He has been having more exertional shortness of breath recently, not all of which is due to heart failure. I think he has angina equivalent. Despite the fact that he appears to be very frail in the bed, he says he actually walks around at home. If that's the case, he will likely need a cardiac catheterization. IV heparin, trend trop. Echo  2. NSVT: 29 beats run, increase BB, close observation, low threshold to activate STEMI today, otherwise plan cath tomorrow. If symptom decompensate, transfer to ICU  3. CAD: Nonobstructive CAD on cardiac catheterization in November 2013, see report above  4. Acute on chronic systolic HF: Chest x-ray showed pulmonary edema. Repeat echocardiogram, his EF decreased from 60 down to 35% in 2014, since then has improved back up to 45%.   5. COPD: no wheezing  6. ESRD on HD MWF: Currently undergoing hemodialysis  7. HTN: Blood pressure stable  8. HLD: Last lipid panel in January 2018 showed well-controlled cholesterol. Continue home lipitor  9. DM II: Managed by internal medicine service, on insulin at home  10. h/o CVA: No obvious neurological deficit   For questions or updates, please contact [place] Please consult [email or web address] for contact  info under Cardiology/STEMI.   Hilbert Corrigan, [place]  09/08/2017 12:57 PM   The patient was seen and examined, and I agree with the history, physical exam, assessment and plan as documented above, with modifications as noted below. I have also personally reviewed all relevant documentation, old records, labs, and both radiographic and cardiovascular studies. I have also independently interpreted old and new ECG's.  71 yr old male with aforementioned history and presentation hospitalized with acute on chronic systolic heart failure. He has been experiencing progressive exertional dyspnea. Denies exertional chest pain. He has coronary artery disease which was not hemodynamically significant in 2013. Troponins currently elevated to 10.11. ECG with inferolateral ST depressions with T wave inversions, consistent with NSTEMI. He has been having multiple runs of ventricular tachycardia. Chest xray shows CHF. Currently on IV heparin, ASA, metoprolol, and Lipitor. He takes Plavix at  home as well and that is prescribed here as well. During my evaluation, he is undergoing hemodialysis.  Plan: Will follow up on echocardiogram to assess for interval changes in cardiac structure and function. Will increase metoprolol for ischemia-mediated ventricular tachycardia. Will arrange for coronary angiography on 11/26 or sooner should he become hemodynamically unstable or develop recalcitrant ventricular tachycardia. Hemodialysis for CHF.  Kate Sable, MD, [facility]  09/08/2017 1:57 PM

## 2017-09-08 NOTE — ED Notes (Signed)
Attempted report X1

## 2017-09-09 ENCOUNTER — Other Ambulatory Visit: Payer: Self-pay

## 2017-09-09 ENCOUNTER — Encounter (HOSPITAL_COMMUNITY)
Admission: EM | Disposition: A | Discharge status: Skilled Nursing Facility | Payer: Self-pay | Source: Home / Self Care | Attending: Thoracic Surgery (Cardiothoracic Vascular Surgery)

## 2017-09-09 DIAGNOSIS — I5042 Chronic combined systolic (congestive) and diastolic (congestive) heart failure: Secondary | ICD-10-CM

## 2017-09-09 DIAGNOSIS — I2510 Atherosclerotic heart disease of native coronary artery without angina pectoris: Secondary | ICD-10-CM

## 2017-09-09 HISTORY — PX: LEFT HEART CATH AND CORONARY ANGIOGRAPHY: CATH118249

## 2017-09-09 LAB — CBC
HEMATOCRIT: 31.3 % — AB (ref 39.0–52.0)
Hemoglobin: 10.4 g/dL — ABNORMAL LOW (ref 13.0–17.0)
MCH: 29.6 pg (ref 26.0–34.0)
MCHC: 33.2 g/dL (ref 30.0–36.0)
MCV: 89.2 fL (ref 78.0–100.0)
PLATELETS: 168 10*3/uL (ref 150–400)
RBC: 3.51 MIL/uL — AB (ref 4.22–5.81)
RDW: 14.7 % (ref 11.5–15.5)
WBC: 8.6 10*3/uL (ref 4.0–10.5)

## 2017-09-09 LAB — BASIC METABOLIC PANEL
ANION GAP: 13 (ref 5–15)
BUN: 50 mg/dL — AB (ref 6–20)
CO2: 28 mmol/L (ref 22–32)
Calcium: 8.6 mg/dL — ABNORMAL LOW (ref 8.9–10.3)
Chloride: 94 mmol/L — ABNORMAL LOW (ref 101–111)
Creatinine, Ser: 8.18 mg/dL — ABNORMAL HIGH (ref 0.61–1.24)
GFR calc Af Amer: 7 mL/min — ABNORMAL LOW (ref >60)
GFR, EST NON AFRICAN AMERICAN: 6 mL/min — AB (ref >60)
GLUCOSE: 233 mg/dL — AB (ref 65–99)
POTASSIUM: 3.9 mmol/L (ref 3.5–5.1)
Sodium: 135 mmol/L (ref 135–145)

## 2017-09-09 LAB — GLUCOSE, CAPILLARY
GLUCOSE-CAPILLARY: 217 mg/dL — AB (ref 65–99)
Glucose-Capillary: 174 mg/dL — ABNORMAL HIGH (ref 65–99)
Glucose-Capillary: 208 mg/dL — ABNORMAL HIGH (ref 65–99)

## 2017-09-09 LAB — POCT ACTIVATED CLOTTING TIME: Activated Clotting Time: 136 seconds

## 2017-09-09 LAB — PROTIME-INR
INR: 1.18
Prothrombin Time: 14.9 seconds (ref 11.4–15.2)

## 2017-09-09 LAB — HEMOGLOBIN A1C
Hgb A1c MFr Bld: 9.1 % — ABNORMAL HIGH (ref 4.8–5.6)
Mean Plasma Glucose: 214 mg/dL

## 2017-09-09 LAB — HEPARIN LEVEL (UNFRACTIONATED): Heparin Unfractionated: 0.23 IU/mL — ABNORMAL LOW (ref 0.30–0.70)

## 2017-09-09 SURGERY — LEFT HEART CATH AND CORONARY ANGIOGRAPHY
Anesthesia: LOCAL

## 2017-09-09 MED ORDER — FENTANYL CITRATE (PF) 100 MCG/2ML IJ SOLN
INTRAMUSCULAR | Status: DC | PRN
  Administered 2017-09-09: 50 ug via INTRAVENOUS

## 2017-09-09 MED ORDER — SODIUM CHLORIDE 0.9% FLUSH
3.0000 mL | Freq: Two times a day (BID) | INTRAVENOUS | Status: DC
  Administered 2017-09-10 – 2017-09-11 (×3): 3 mL via INTRAVENOUS

## 2017-09-09 MED ORDER — LIDOCAINE HCL (PF) 1 % IJ SOLN
INTRAMUSCULAR | Status: AC
  Filled 2017-09-09: qty 30

## 2017-09-09 MED ORDER — SODIUM CHLORIDE 0.9 % IV SOLN
250.0000 mL | INTRAVENOUS | Status: DC | PRN
  Administered 2017-09-13 (×2): via INTRAVENOUS

## 2017-09-09 MED ORDER — METOPROLOL TARTRATE 25 MG PO TABS
25.0000 mg | ORAL_TABLET | Freq: Two times a day (BID) | ORAL | Status: DC
  Administered 2017-09-09 – 2017-09-12 (×5): 25 mg via ORAL
  Filled 2017-09-09 (×6): qty 1

## 2017-09-09 MED ORDER — ACETAMINOPHEN 325 MG PO TABS
650.0000 mg | ORAL_TABLET | ORAL | Status: DC | PRN
  Administered 2017-09-11 – 2017-09-12 (×5): 650 mg via ORAL
  Filled 2017-09-09 (×5): qty 2

## 2017-09-09 MED ORDER — ATORVASTATIN CALCIUM 80 MG PO TABS
80.0000 mg | ORAL_TABLET | Freq: Every day | ORAL | Status: DC
  Administered 2017-09-10 – 2017-10-03 (×21): 80 mg via ORAL
  Filled 2017-09-09 (×20): qty 1

## 2017-09-09 MED ORDER — HEPARIN (PORCINE) IN NACL 100-0.45 UNIT/ML-% IJ SOLN
1400.0000 [IU]/h | INTRAMUSCULAR | Status: DC
  Administered 2017-09-10 – 2017-09-12 (×4): 1400 [IU]/h via INTRAVENOUS
  Filled 2017-09-09 (×4): qty 250

## 2017-09-09 MED ORDER — MIDAZOLAM HCL 2 MG/2ML IJ SOLN
INTRAMUSCULAR | Status: DC | PRN
  Administered 2017-09-09: 2 mg via INTRAVENOUS

## 2017-09-09 MED ORDER — MIDAZOLAM HCL 2 MG/2ML IJ SOLN
INTRAMUSCULAR | Status: AC
  Filled 2017-09-09: qty 2

## 2017-09-09 MED ORDER — ONDANSETRON HCL 4 MG/2ML IJ SOLN: 4.0000 mg | Freq: Four times a day (QID) | INTRAMUSCULAR | Status: DC | PRN

## 2017-09-09 MED ORDER — IOPAMIDOL (ISOVUE-370) INJECTION 76%
INTRAVENOUS | Status: DC | PRN
Start: 2017-09-09 — End: 2017-09-09
  Administered 2017-09-09: 65 mL via INTRA_ARTERIAL

## 2017-09-09 MED ORDER — LIDOCAINE HCL (PF) 1 % IJ SOLN
INTRAMUSCULAR | Status: DC | PRN
  Administered 2017-09-09: 15 mL via INTRADERMAL

## 2017-09-09 MED ORDER — ASPIRIN 81 MG PO CHEW: 81.0000 mg | CHEWABLE_TABLET | Freq: Every day | ORAL | Status: DC

## 2017-09-09 MED ORDER — SODIUM CHLORIDE 0.9 % IV SOLN
INTRAVENOUS | Status: AC | PRN
  Administered 2017-09-09: 10 mL/h via INTRAVENOUS

## 2017-09-09 MED ORDER — NITROGLYCERIN IN D5W 200-5 MCG/ML-% IV SOLN
0.0000 ug/min | INTRAVENOUS | Status: DC
  Administered 2017-09-09: 5 ug/min via INTRAVENOUS

## 2017-09-09 MED ORDER — HEPARIN (PORCINE) IN NACL 2-0.9 UNIT/ML-% IJ SOLN
INTRAMUSCULAR | Status: AC | PRN
  Administered 2017-09-09: 1000 mL

## 2017-09-09 MED ORDER — SODIUM CHLORIDE 0.9% FLUSH: 3.0000 mL | INTRAVENOUS | Status: DC | PRN

## 2017-09-09 MED ORDER — FENTANYL CITRATE (PF) 100 MCG/2ML IJ SOLN
INTRAMUSCULAR | Status: AC
  Filled 2017-09-09: qty 2

## 2017-09-09 MED ORDER — IOPAMIDOL (ISOVUE-370) INJECTION 76%
INTRAVENOUS | Status: AC
  Filled 2017-09-09: qty 100

## 2017-09-09 MED ORDER — HEPARIN (PORCINE) IN NACL 2-0.9 UNIT/ML-% IJ SOLN
INTRAMUSCULAR | Status: AC
  Filled 2017-09-09: qty 1000

## 2017-09-09 MED ORDER — NITROGLYCERIN IN D5W 200-5 MCG/ML-% IV SOLN
INTRAVENOUS | Status: AC | PRN
  Administered 2017-09-09: 5 ug/min via INTRAVENOUS

## 2017-09-09 MED ORDER — NITROGLYCERIN IN D5W 200-5 MCG/ML-% IV SOLN
INTRAVENOUS | Status: AC
  Filled 2017-09-09: qty 250

## 2017-09-09 SURGICAL SUPPLY — 7 items
CATH INFINITI 5FR MULTPACK ANG (CATHETERS) ×2 IMPLANT
KIT HEART LEFT (KITS) ×2 IMPLANT
PACK CARDIAC CATHETERIZATION (CUSTOM PROCEDURE TRAY) ×2 IMPLANT
SHEATH PINNACLE 5F 10CM (SHEATH) ×2 IMPLANT
TRANSDUCER W/STOPCOCK (MISCELLANEOUS) ×2 IMPLANT
TUBING CIL FLEX 10 FLL-RA (TUBING) IMPLANT
WIRE EMERALD 3MM-J .035X150CM (WIRE) ×2 IMPLANT

## 2017-09-09 NOTE — Progress Notes (Addendum)
Site Area: Right Femoral Artery Site Prior to Removal: Level 0 Pressure Applied For: 25 Min Manual Pressure: Yes Pt Status During Pull:  Stable Post Pull Site: Level 0 Post Pull Instructions Given:  Yes Post Pull Pulses Present: Doppler Dressing Applied: Gauze and Tegaderm Bedrest Begins @14 :15 til 18:15

## 2017-09-09 NOTE — Progress Notes (Signed)
ANTICOAGULATION CONSULT NOTE - Follow Up Consult  Pharmacy Consult for heparin Indication: NSTEMI  Labs: Recent Labs    09/08/17 0046 09/08/17 0939 09/08/17 1221 09/08/17 1901 09/08/17 2139 09/09/17 0318  HGB 11.6*  --   --   --   --  10.4*  HCT 34.8*  --   --   --   --  31.3*  PLT 145*  --   --   --   --  168  LABPROT  --   --   --   --   --  14.9  INR  --   --   --   --   --  1.18  HEPARINUNFRC  --   --   --  0.31  --  0.23*  CREATININE 10.39*  --   --   --   --  8.18*  TROPONINI  --  8.33* 10.11*  --  8.61*  --     Assessment: 71yo male now below goal on heparin after one level at low end of goal, likely initial level related to bolus, no gtt issues overnight.  Goal of Therapy:  Heparin level 0.3-0.7 units/ml   Plan:  Will increase heparin gtt by 2 units/kg/hr to 1400 units/hr and check level in [place], PharmD, BCPS  09/09/2017,4:39 AM

## 2017-09-09 NOTE — Progress Notes (Signed)
Pts wife called for update on patient and "time of discharge tomorrow". I updated her that plan was for Cardiac Cath tomorrow w/explanation of the days events. Pt is unable to sign consent due to intermittent confusion. Wife states she would like to discuss procedure and alternatives with MD prior to making decision. Pt remains CP free, IV heparin infusing per orders, VS stable. Will continue to monitor. Jessie Foot, RN

## 2017-09-09 NOTE — Interval H&P Note (Signed)
Cath Lab Visit (complete for each Cath Lab visit)  Clinical Evaluation Leading to the Procedure:   ACS: Yes.    Non-ACS:    Anginal Classification: CCS III  Anti-ischemic medical therapy: Minimal Therapy (1 class of medications)  Non-Invasive Test Results: No non-invasive testing performed  Prior CABG: No previous CABG      History and Physical Interval Note:  09/09/2017 12:49 PM  Ernest [name]  has presented today for surgery, with the diagnosis of NSTEMI  The various methods of treatment have been discussed with the patient and family. After consideration of risks, benefits and other options for treatment, the patient has consented to  Procedure(s): LEFT HEART CATH AND CORONARY ANGIOGRAPHY (N/A) as a surgical intervention .  The patient's history has been reviewed, patient examined, no change in status, stable for surgery.  I have reviewed the patient's chart and labs.  Questions were answered to the patient's satisfaction.     Shelva Majestic

## 2017-09-09 NOTE — H&P (View-Only) (Signed)
Progress Note  Patient Name: Ernest [name] Date of Encounter: 09/09/2017  Primary Cardiologist: Johnsie Cancel   Subjective   Breathing is much improved, and no reports of chest pain.   Inpatient Medications    Scheduled Meds: . aspirin  81 mg Oral Pre-Cath  . aspirin EC  81 mg Oral Daily  . atorvastatin  40 mg Oral Daily  . calcitRIOL  0.75 mcg Oral Q M,W,F-HD  . cinacalcet  120 mg Oral Q supper  . clopidogrel  75 mg Oral Daily  . insulin aspart  0-5 Units Subcutaneous QHS  . insulin aspart  0-9 Units Subcutaneous TID WC  . insulin aspart  3 Units Subcutaneous TID WC  . insulin glargine  10 Units Subcutaneous QHS  . levETIRAcetam  1,000 mg Oral Daily  . metoprolol tartrate  100 mg Oral BID  . multivitamin  1 tablet Oral Daily  . sevelamer carbonate  2,400 mg Oral TID WC  . sodium chloride flush  3 mL Intravenous Q12H   Continuous Infusions: . sodium chloride    . sodium chloride    . heparin 1,400 Units/hr (09/09/17 0537)   PRN Meds: sodium chloride, sodium chloride flush   Vital Signs    Vitals:   09/09/17 0034 09/09/17 0035 09/09/17 0050 09/09/17 0507  BP: (!) 158/94 99/79 91/69  (!) 108/50  Pulse: 88 72 81 83  Resp: 18   18  Temp: 98 F (36.7 C)   98 F (36.7 C)  TempSrc: Oral   Oral  SpO2:  (!) 87% 90%   Weight:    210 lb 14.4 oz (95.7 kg)  Height:        Intake/Output Summary (Last 24 hours) at 09/09/2017 0844 Last data filed at 09/09/2017 0200 Gross per 24 hour  Intake 339.8 ml  Output 4000 ml  Net -3660.2 ml   Filed Weights   09/08/17 1045 09/08/17 1430 09/09/17 0507  Weight: 216 lb 14.9 oz (98.4 kg) 209 lb 14.1 oz (95.2 kg) 210 lb 14.4 oz (95.7 kg)    Telemetry    SR with freq PVCs, short runs of NSVT - Personally Reviewed  ECG    SR with PVCs - Personally Reviewed  Physical Exam   General: Well developed, well nourished, male appearing in no acute distress. Head: Normocephalic, atraumatic.  Neck: Supple without bruits, JVD. Lungs:   Resp regular and unlabored, CTA. Heart: RRR, S1, S2, no S3, S4, or murmur; no rub. Abdomen: Soft, non-tender, non-distended with normoactive bowel sounds.  Extremities: No clubbing, cyanosis, edema. Distal pedal pulses are 2+ bilaterally. Left arm AV fistula.  Neuro: Alert and oriented X 3. Moves all extremities spontaneously. Psych: Normal affect.  Labs    Chemistry Recent Labs  Lab 09/08/17 0046 09/09/17 0318  NA 134* 135  K 4.4 3.9  CL 94* 94*  CO2 22 28  GLUCOSE 407* 233*  BUN 71* 50*  CREATININE 10.39* 8.18*  CALCIUM 9.4 8.6*  PROT 6.5  --   ALBUMIN 3.5  --   AST 25  --   ALT 13*  --   ALKPHOS 50  --   BILITOT 0.8  --   GFRNONAA 4* 6*  GFRAA 5* 7*  ANIONGAP 18* 13     Hematology Recent Labs  Lab 09/08/17 0046 09/09/17 0318  WBC 11.7* 8.6  RBC 3.88* 3.51*  HGB 11.6* 10.4*  HCT 34.8* 31.3*  MCV 89.7 89.2  MCH 29.9 29.6  MCHC 33.3 33.2  RDW 14.7 14.7  PLT 145* 168    Cardiac Enzymes Recent Labs  Lab 09/08/17 0939 09/08/17 1221 09/08/17 2139  TROPONINI 8.33* 10.11* 8.61*    Recent Labs  Lab 09/08/17 0049  TROPIPOC 6.48*     BNP Recent Labs  Lab 09/08/17 0046  BNP 4,125.9*     DDimer No results for input(s): DDIMER in the last 168 hours.    Radiology    Dg Chest 2 View  Result Date: 09/08/2017 CLINICAL DATA:  Increasing shortness of breath over 2 days. Decreased oxygen saturation. EXAM: CHEST  2 VIEW COMPARISON:  12/15/2016 FINDINGS: Shallow inspiration. Cardiac enlargement with pulmonary vascular congestion. Interstitial pattern to the lungs likely representing edema. No blunting of costophrenic angles. No pneumothorax. No focal consolidation. IMPRESSION: Cardiac enlargement with pulmonary vascular congestion and interstitial edema. Electronically Signed   By: Lucienne Capers M.D.   On: 09/08/2017 00:27    Cardiac Studies   TTE: 09/08/17  Study Conclusions  - Left ventricle: Inferobasal and posterior lateral hypokinesis EF    similar to echo from 2016. The cavity size was normal. Wall   thickness was normal. Systolic function was mildly reduced. The   estimated ejection fraction was in the range of 45% to 50%. - Mitral valve: Calcified annulus. Mildly thickened leaflets .   There was mild regurgitation. - Left atrium: The atrium was moderately dilated. - Pulmonary arteries: PA peak pressure: 50 mm Hg (S).  Cath: 11/13  Cath 08/26/2012 Left mainstem: There is a 50% eccentric ostial left main stenosis.  Left anterior descending (LAD): The LAD is heavily calcified throughout the proximal vessel. There is 50-70% stenosis in the proximal vessel followed by segment of 40% disease in the proximal to mid vessel.  The ramus intermediate branch is moderate in size and has mild disease at the ostium up to 30%.  Left circumflex (LCx): The left circumflex has minor irregularities up to 20%.  Right coronary artery (RCA): The right coronary has a 50% stenosis in the proximal vessel. The remainder of the vessel is without significant disease.  Left ventriculography: There is apical hypokinesis with mild left ventricular dysfunction. Ejection fraction is estimated at 50%.  Final Conclusions:  1. There is modest disease involving the ostial left main, proximal LAD and proximal right coronary. These lesions do not appear to be hemodynamically significant. 2. Mild left ventricular dysfunction with apical hypokinesis.  Recommendations: Cardiac catheterization findings do not correlate well with his Myoview study. His ejection fraction looks better angiographically than 40%. Patient has been asymptomatic. I would recommend medical management.   Patient Profile     71 y.o. male with a hx of CAD, chronic systolic HF, COPD, ESRD on HD MWF, HTN, HLD, DM II and h/o CVA who presented with acute on chronic systolic HF and found to have an elevated troponin.   Assessment & Plan    1. NSTEMI: Troponin peaked at 10. He  continues to deny any chest pain this morning. Plan is for cardiac cath today. Remains on IV heparin. Echo showed EF of 45-50% with no WMA. Did try to call his wife to update this morning as there was concern over consent for cardiac cath.   2. NSVT, PVCS: continues to have short runs on telemetry, though he is asymptomatic. Will continue BB, electrolytes are stable.   3. CAD: Had nonobstructive disease on cath back in 2013. See cath report above.  4. Acute on chronic systolic HF: EF stable on echo this admission. Nephrology following for HD. Overall  breathing has significantly improved.   5. ERSD on HD: Had HD yesterday and planned for again today  6. DM: management per primary  Signed, Reino Bellis, NP  09/09/2017, 8:44 AM  Pager # [phone]   For questions or updates, please contact [place] Please consult [email or web address] for contact info under Cardiology/STEMI.   I have examined the patient and reviewed assessment and plan and discussed with patient.  Agree with above as stated.  Continue plans for cardiac cath.  He asked me to call his wife.  Several attempts were made for unsuccessful.  He states that his wife will be coming to the hospital this morning.  High likelihood of significant coronary disease given his risk factors.  Telemetry reviewed and frequent PVCs noted.  Some evidence of nonsustained ventricular tachycardia.  All questions about the cardiac cath answered.  Larae Grooms

## 2017-09-09 NOTE — Progress Notes (Signed)
ANTICOAGULATION CONSULT NOTE - Follow Up Consult  Pharmacy Consult for heparin Indication: NSTEMI  Labs: Recent Labs    09/08/17 0046 09/08/17 0939 09/08/17 1221 09/08/17 1901 09/08/17 2139 09/09/17 0318  HGB 11.6*  --   --   --   --  10.4*  HCT 34.8*  --   --   --   --  31.3*  PLT 145*  --   --   --   --  168  LABPROT  --   --   --   --   --  14.9  INR  --   --   --   --   --  1.18  HEPARINUNFRC  --   --   --  0.31  --  0.23*  CREATININE 10.39*  --   --   --   --  8.18*  TROPONINI  --  8.33* 10.11*  --  8.61*  --     Assessment: 71yo male on heparin for ACS. Cath report today showing significant calcification (65% Ost LM, 90-95% mid LM, 100% Ost Cx to Prox Cx, and 60% at acute Mrg), recommending surgical consultation for CABG. Plan to restart heparin 8 hours after sheath removal (10/26 at 1415).   Hgb down slightly from 11.6 to 10.4. Platelets remain stable. No signs/symptoms of bleeding documented.  Goal of Therapy:  Heparin level 0.3-0.7 units/ml   Plan:  Will restart heparin at 1400 units/hr at 2215 Obtain 8 hour level from restart time Monitor daily HL, CBC, and s/sx of bleeding Follow along for plan by [facility]VS for surgery   Doylene Canard, PharmD Clinical Pharmacist  Pager: [phone] Clinical Phone for 09/09/2017 until 3:30pm: [phone] If after 3:30pm, please call main pharmacy at [phone] 09/09/2017,1:59 PM

## 2017-09-09 NOTE — Progress Notes (Addendum)
[facility] Progress Note  Subjective: in bed, no SOB.  4L off with HD yesterday. Possible heart cath today per RN.   Vitals:   09/09/17 0034 09/09/17 0035 09/09/17 0050 09/09/17 0507  BP: (!) 158/94 99/79 91/69  (!) 108/50  Pulse: 88 72 81 83  Resp: 18   18  Temp: 98 F (36.7 C)   98 F (36.7 C)  TempSrc: Oral   Oral  SpO2:  (!) 87% 90%   Weight:    95.7 kg (210 lb 14.4 oz)  Height:        Inpatient medications: . aspirin  81 mg Oral Pre-Cath  . aspirin EC  81 mg Oral Daily  . atorvastatin  40 mg Oral Daily  . calcitRIOL  0.75 mcg Oral Q M,W,F-HD  . cinacalcet  120 mg Oral Q supper  . clopidogrel  75 mg Oral Daily  . insulin aspart  0-5 Units Subcutaneous QHS  . insulin aspart  0-9 Units Subcutaneous TID WC  . insulin aspart  3 Units Subcutaneous TID WC  . insulin glargine  10 Units Subcutaneous QHS  . levETIRAcetam  1,000 mg Oral Daily  . metoprolol tartrate  100 mg Oral BID  . multivitamin  1 tablet Oral Daily  . sevelamer carbonate  2,400 mg Oral TID WC  . sodium chloride flush  3 mL Intravenous Q12H   . sodium chloride    . sodium chloride    . heparin 1,400 Units/hr (09/09/17 0537)   sodium chloride, sodium chloride flush  Exam: Alert, lying in bed, nasal O2 No jvd Rales R base, L clear RRR no mrg ABd obese soft ntnd No LE edema LUA AVG +bruit  Dialysis: [facility] MWF 4h 32min  95.5kg   2/2 bath  LUA AVG  Hep none -venofer 50qwk -calc 0.75 w HD      Impression: 1. SOB/ pulm edema - suspect fur to vol overload. Hx CAD by cath in 2013 (nonobstruct LM/ LAD/ RCA).  HD Sunday w 4L off, down to dry wt today. HD possibly today vs tomorrow (for LHC today) , get vol down further and lower dry wt. Will lower metop dose.  2. NSTEMI/ CM EF 45-50% - peak trop 10, for LHC today. On IV heparin.  3. ESRD HD MWF 4. HTN - BP's soft w vol down,  have lowered MTP to 25 bid 5. Anemia of CKD - Hb 11's, no esa  6. MBD of CKD - sensipar 120/d, binder ? Auryxia,  calcitriol w HD tiw 7. DM per primary  Plan - as above   Kelly Splinter MD [place] pager [phone]   09/09/2017, 10:10 AM   Recent Labs  Lab 09/08/17 0046 09/09/17 0318  NA 134* 135  K 4.4 3.9  CL 94* 94*  CO2 22 28  GLUCOSE 407* 233*  BUN 71* 50*  CREATININE 10.39* 8.18*  CALCIUM 9.4 8.6*   Recent Labs  Lab 09/08/17 0046  AST 25  ALT 13*  ALKPHOS 50  BILITOT 0.8  PROT 6.5  ALBUMIN 3.5   Recent Labs  Lab 09/08/17 0046 09/09/17 0318  WBC 11.7* 8.6  NEUTROABS 9.6*  --   HGB 11.6* 10.4*  HCT 34.8* 31.3*  MCV 89.7 89.2  PLT 145* 168   Iron/TIBC/Ferritin/ %Sat No results found for: IRON, TIBC, FERRITIN, IRONPCTSAT

## 2017-09-09 NOTE — Progress Notes (Signed)
Progress Note  Patient Name: Ernest [name] Date of Encounter: 09/09/2017  Primary Cardiologist: Johnsie Cancel   Subjective   Breathing is much improved, and no reports of chest pain.   Inpatient Medications    Scheduled Meds: . aspirin  81 mg Oral Pre-Cath  . aspirin EC  81 mg Oral Daily  . atorvastatin  40 mg Oral Daily  . calcitRIOL  0.75 mcg Oral Q M,W,F-HD  . cinacalcet  120 mg Oral Q supper  . clopidogrel  75 mg Oral Daily  . insulin aspart  0-5 Units Subcutaneous QHS  . insulin aspart  0-9 Units Subcutaneous TID WC  . insulin aspart  3 Units Subcutaneous TID WC  . insulin glargine  10 Units Subcutaneous QHS  . levETIRAcetam  1,000 mg Oral Daily  . metoprolol tartrate  100 mg Oral BID  . multivitamin  1 tablet Oral Daily  . sevelamer carbonate  2,400 mg Oral TID WC  . sodium chloride flush  3 mL Intravenous Q12H   Continuous Infusions: . sodium chloride    . sodium chloride    . heparin 1,400 Units/hr (09/09/17 0537)   PRN Meds: sodium chloride, sodium chloride flush   Vital Signs    Vitals:   09/09/17 0034 09/09/17 0035 09/09/17 0050 09/09/17 0507  BP: (!) 158/94 99/79 91/69  (!) 108/50  Pulse: 88 72 81 83  Resp: 18   18  Temp: 98 F (36.7 C)   98 F (36.7 C)  TempSrc: Oral   Oral  SpO2:  (!) 87% 90%   Weight:    210 lb 14.4 oz (95.7 kg)  Height:        Intake/Output Summary (Last 24 hours) at 09/09/2017 0844 Last data filed at 09/09/2017 0200 Gross per 24 hour  Intake 339.8 ml  Output 4000 ml  Net -3660.2 ml   Filed Weights   09/08/17 1045 09/08/17 1430 09/09/17 0507  Weight: 216 lb 14.9 oz (98.4 kg) 209 lb 14.1 oz (95.2 kg) 210 lb 14.4 oz (95.7 kg)    Telemetry    SR with freq PVCs, short runs of NSVT - Personally Reviewed  ECG    SR with PVCs - Personally Reviewed  Physical Exam   General: Well developed, well nourished, male appearing in no acute distress. Head: Normocephalic, atraumatic.  Neck: Supple without bruits, JVD. Lungs:   Resp regular and unlabored, CTA. Heart: RRR, S1, S2, no S3, S4, or murmur; no rub. Abdomen: Soft, non-tender, non-distended with normoactive bowel sounds.  Extremities: No clubbing, cyanosis, edema. Distal pedal pulses are 2+ bilaterally. Left arm AV fistula.  Neuro: Alert and oriented X 3. Moves all extremities spontaneously. Psych: Normal affect.  Labs    Chemistry Recent Labs  Lab 09/08/17 0046 09/09/17 0318  NA 134* 135  K 4.4 3.9  CL 94* 94*  CO2 22 28  GLUCOSE 407* 233*  BUN 71* 50*  CREATININE 10.39* 8.18*  CALCIUM 9.4 8.6*  PROT 6.5  --   ALBUMIN 3.5  --   AST 25  --   ALT 13*  --   ALKPHOS 50  --   BILITOT 0.8  --   GFRNONAA 4* 6*  GFRAA 5* 7*  ANIONGAP 18* 13     Hematology Recent Labs  Lab 09/08/17 0046 09/09/17 0318  WBC 11.7* 8.6  RBC 3.88* 3.51*  HGB 11.6* 10.4*  HCT 34.8* 31.3*  MCV 89.7 89.2  MCH 29.9 29.6  MCHC 33.3 33.2  RDW 14.7 14.7  PLT 145* 168    Cardiac Enzymes Recent Labs  Lab 09/08/17 0939 09/08/17 1221 09/08/17 2139  TROPONINI 8.33* 10.11* 8.61*    Recent Labs  Lab 09/08/17 0049  TROPIPOC 6.48*     BNP Recent Labs  Lab 09/08/17 0046  BNP 4,125.9*     DDimer No results for input(s): DDIMER in the last 168 hours.    Radiology    Dg Chest 2 View  Result Date: 09/08/2017 CLINICAL DATA:  Increasing shortness of breath over 2 days. Decreased oxygen saturation. EXAM: CHEST  2 VIEW COMPARISON:  12/15/2016 FINDINGS: Shallow inspiration. Cardiac enlargement with pulmonary vascular congestion. Interstitial pattern to the lungs likely representing edema. No blunting of costophrenic angles. No pneumothorax. No focal consolidation. IMPRESSION: Cardiac enlargement with pulmonary vascular congestion and interstitial edema. Electronically Signed   By: Lucienne Capers M.D.   On: 09/08/2017 00:27    Cardiac Studies   TTE: 09/08/17  Study Conclusions  - Left ventricle: Inferobasal and posterior lateral hypokinesis EF    similar to echo from 2016. The cavity size was normal. Wall   thickness was normal. Systolic function was mildly reduced. The   estimated ejection fraction was in the range of 45% to 50%. - Mitral valve: Calcified annulus. Mildly thickened leaflets .   There was mild regurgitation. - Left atrium: The atrium was moderately dilated. - Pulmonary arteries: PA peak pressure: 50 mm Hg (S).  Cath: 11/13  Cath 08/26/2012 Left mainstem: There is a 50% eccentric ostial left main stenosis.  Left anterior descending (LAD): The LAD is heavily calcified throughout the proximal vessel. There is 50-70% stenosis in the proximal vessel followed by segment of 40% disease in the proximal to mid vessel.  The ramus intermediate branch is moderate in size and has mild disease at the ostium up to 30%.  Left circumflex (LCx): The left circumflex has minor irregularities up to 20%.  Right coronary artery (RCA): The right coronary has a 50% stenosis in the proximal vessel. The remainder of the vessel is without significant disease.  Left ventriculography: There is apical hypokinesis with mild left ventricular dysfunction. Ejection fraction is estimated at 50%.  Final Conclusions:  1. There is modest disease involving the ostial left main, proximal LAD and proximal right coronary. These lesions do not appear to be hemodynamically significant. 2. Mild left ventricular dysfunction with apical hypokinesis.  Recommendations: Cardiac catheterization findings do not correlate well with his Myoview study. His ejection fraction looks better angiographically than 40%. Patient has been asymptomatic. I would recommend medical management.   Patient Profile     71 y.o. male with a hx of CAD, chronic systolic HF, COPD, ESRD on HD MWF, HTN, HLD, DM II and h/o CVA who presented with acute on chronic systolic HF and found to have an elevated troponin.   Assessment & Plan    1. NSTEMI: Troponin peaked at 10. He  continues to deny any chest pain this morning. Plan is for cardiac cath today. Remains on IV heparin. Echo showed EF of 45-50% with no WMA. Did try to call his wife to update this morning as there was concern over consent for cardiac cath.   2. NSVT, PVCS: continues to have short runs on telemetry, though he is asymptomatic. Will continue BB, electrolytes are stable.   3. CAD: Had nonobstructive disease on cath back in 2013. See cath report above.  4. Acute on chronic systolic HF: EF stable on echo this admission. Nephrology following for HD. Overall  breathing has significantly improved.   5. ERSD on HD: Had HD yesterday and planned for again today  6. DM: management per primary  Signed, Reino Bellis, NP  09/09/2017, 8:44 AM  Pager # [phone]   For questions or updates, please contact [place] Please consult [email or web address] for contact info under Cardiology/STEMI.   I have examined the patient and reviewed assessment and plan and discussed with patient.  Agree with above as stated.  Continue plans for cardiac cath.  He asked me to call his wife.  Several attempts were made for unsuccessful.  He states that his wife will be coming to the hospital this morning.  High likelihood of significant coronary disease given his risk factors.  Telemetry reviewed and frequent PVCs noted.  Some evidence of nonsustained ventricular tachycardia.  All questions about the cardiac cath answered.  Larae Grooms

## 2017-09-09 NOTE — Progress Notes (Signed)
   Subjective: Mr. Peddie was seen laying in his bed this morning. He states that he is doing well and has not had any shortness of breath or chest pain.   Objective:  Vital signs in last 24 hours: Vitals:   09/09/17 0034 09/09/17 0035 09/09/17 0050 09/09/17 0507  BP: (!) 158/94 99/79 91/69  (!) 108/50  Pulse: 88 72 81 83  Resp: 18   18  Temp: 98 F (36.7 C)   98 F (36.7 C)  TempSrc: Oral   Oral  SpO2:  (!) 87% 90%   Weight:    210 lb 14.4 oz (95.7 kg)  Height:       Physical Exam  Constitutional: He appears well-developed and well-nourished.  HENT:  Head: Normocephalic and atraumatic.  Cardiovascular: Normal rate, regular rhythm, normal heart sounds and intact distal pulses.  Pulmonary/Chest: Effort normal. No accessory muscle usage. No respiratory distress.  Abdominal: Soft. Bowel sounds are normal. He exhibits no distension. There is no tenderness.    Assessment/Plan:  Dyspnea due to Acute on chronic combined systolic and diastolic congestive heart failure thought to be due to NSTEMI  -Troponins started to downtrend last night from 10>8 -Coronary artery angiogram to be done today 09/09/17. I spoke to patient's wife about procedure, but she also wants to speak to cardiology -TTE (09/08/17): 45-50%, calcified mitral annulus, mildly thickened mitral leaflet, pulmonary artery peak pressure 16mmhg -Continue heparin drip -Aspirin 325mg  given -Hemodialysis for chf  End-stage renal disease  Patient to be dialyzed again today 09/09/17 -Continue Calcitrol 0.75 MCG Monday Wednesday Friday -Continue Cinacalcet at 120 mg daily -Continue sevelamer 2400 mg 3 times daily -Continue multivitamin  Diabetes mellitus type II  The patient's blood glucose has been ranging 170-406.  -Lantus 10units qhs -3 units aspart with meals -SSI-changed from sensitive to moderate   Essential hypertension  Patient's blood pressure over the past 24 hours has ranged 86-158/54-94 -Continue  metoprolol 50 mg twice daily  Coronary artery disease -Continue clopidogrel 75 mg daily -Aspirin 325mg  given -Continue atorvastatin 40 mg nightly  DVT MBT:DHRCBUL   Dispo: Anticipated discharge in approximately 1-2 day.   Lars Mage, MD Internal Medicine PGY1 Pager:[phone] 09/09/2017, 7:46 AM

## 2017-09-10 ENCOUNTER — Inpatient Hospital Stay (HOSPITAL_COMMUNITY): Payer: Medicare Other

## 2017-09-10 ENCOUNTER — Encounter (HOSPITAL_COMMUNITY): Payer: Self-pay | Admitting: Cardiovascular Disease

## 2017-09-10 DIAGNOSIS — R778 Other specified abnormalities of plasma proteins: Secondary | ICD-10-CM

## 2017-09-10 DIAGNOSIS — R748 Abnormal levels of other serum enzymes: Secondary | ICD-10-CM

## 2017-09-10 DIAGNOSIS — Z992 Dependence on renal dialysis: Secondary | ICD-10-CM

## 2017-09-10 DIAGNOSIS — R7989 Other specified abnormal findings of blood chemistry: Secondary | ICD-10-CM

## 2017-09-10 DIAGNOSIS — E119 Type 2 diabetes mellitus without complications: Secondary | ICD-10-CM

## 2017-09-10 DIAGNOSIS — N186 End stage renal disease: Secondary | ICD-10-CM

## 2017-09-10 DIAGNOSIS — Z01810 Encounter for preprocedural cardiovascular examination: Secondary | ICD-10-CM

## 2017-09-10 DIAGNOSIS — I25110 Atherosclerotic heart disease of native coronary artery with unstable angina pectoris: Secondary | ICD-10-CM

## 2017-09-10 LAB — BASIC METABOLIC PANEL
ANION GAP: 14 (ref 5–15)
BUN: 68 mg/dL — ABNORMAL HIGH (ref 6–20)
CHLORIDE: 93 mmol/L — AB (ref 101–111)
CO2: 24 mmol/L (ref 22–32)
Calcium: 8.1 mg/dL — ABNORMAL LOW (ref 8.9–10.3)
Creatinine, Ser: 10.19 mg/dL — ABNORMAL HIGH (ref 0.61–1.24)
GFR calc Af Amer: 5 mL/min — ABNORMAL LOW (ref >60)
GFR, EST NON AFRICAN AMERICAN: 5 mL/min — AB (ref >60)
GLUCOSE: 238 mg/dL — AB (ref 65–99)
POTASSIUM: 4.6 mmol/L (ref 3.5–5.1)
Sodium: 131 mmol/L — ABNORMAL LOW (ref 135–145)

## 2017-09-10 LAB — RENAL FUNCTION PANEL
ALBUMIN: 2.7 g/dL — AB (ref 3.5–5.0)
ANION GAP: 16 — AB (ref 5–15)
BUN: 72 mg/dL — ABNORMAL HIGH (ref 6–20)
CO2: 24 mmol/L (ref 22–32)
Calcium: 8 mg/dL — ABNORMAL LOW (ref 8.9–10.3)
Chloride: 92 mmol/L — ABNORMAL LOW (ref 101–111)
Creatinine, Ser: 10.61 mg/dL — ABNORMAL HIGH (ref 0.61–1.24)
GFR calc Af Amer: 5 mL/min — ABNORMAL LOW (ref >60)
GFR calc non Af Amer: 4 mL/min — ABNORMAL LOW (ref >60)
GLUCOSE: 235 mg/dL — AB (ref 65–99)
PHOSPHORUS: 6.4 mg/dL — AB (ref 2.5–4.6)
POTASSIUM: 4.3 mmol/L (ref 3.5–5.1)
Sodium: 132 mmol/L — ABNORMAL LOW (ref 135–145)

## 2017-09-10 LAB — PULMONARY FUNCTION TEST
FEF 25-75 POST: 1.72 L/s
FEF 25-75 Pre: 1.01 L/sec
FEF2575-%CHANGE-POST: 69 %
FEF2575-%PRED-PRE: 41 %
FEF2575-%Pred-Post: 70 %
FEV1-%Change-Post: 14 %
FEV1-%Pred-Post: 48 %
FEV1-%Pred-Pre: 42 %
FEV1-Post: 1.35 L
FEV1-Pre: 1.18 L
FEV1FVC-%CHANGE-POST: 8 %
FEV1FVC-%PRED-PRE: 101 %
FEV6-%Change-Post: 2 %
FEV6-%PRED-PRE: 41 %
FEV6-%Pred-Post: 43 %
FEV6-POST: 1.52 L
FEV6-Pre: 1.48 L
FEV6FVC-%Change-Post: 2 %
FEV6FVC-%Pred-Post: 105 %
FEV6FVC-%Pred-Pre: 103 %
FVC-%Change-Post: 5 %
FVC-%PRED-POST: 42 %
FVC-%PRED-PRE: 40 %
FVC-POST: 1.59 L
FVC-PRE: 1.51 L
PRE FEV1/FVC RATIO: 78 %
PRE FEV6/FVC RATIO: 98 %
Post FEV1/FVC ratio: 85 %
Post FEV6/FVC ratio: 100 %

## 2017-09-10 LAB — CBC
HEMATOCRIT: 29.5 % — AB (ref 39.0–52.0)
HEMOGLOBIN: 9.9 g/dL — AB (ref 13.0–17.0)
MCH: 29.1 pg (ref 26.0–34.0)
MCHC: 33.6 g/dL (ref 30.0–36.0)
MCV: 86.8 fL (ref 78.0–100.0)
Platelets: 199 10*3/uL (ref 150–400)
RBC: 3.4 MIL/uL — AB (ref 4.22–5.81)
RDW: 14.3 % (ref 11.5–15.5)
WBC: 6.6 10*3/uL (ref 4.0–10.5)

## 2017-09-10 LAB — HEPARIN LEVEL (UNFRACTIONATED): Heparin Unfractionated: 0.34 IU/mL (ref 0.30–0.70)

## 2017-09-10 LAB — GLUCOSE, CAPILLARY
GLUCOSE-CAPILLARY: 181 mg/dL — AB (ref 65–99)
GLUCOSE-CAPILLARY: 238 mg/dL — AB (ref 65–99)
GLUCOSE-CAPILLARY: 191 mg/dL — AB (ref 65–99)
GLUCOSE-CAPILLARY: 158 mg/dL — AB (ref 65–99)
Glucose-Capillary: 286 mg/dL — ABNORMAL HIGH (ref 65–99)

## 2017-09-10 MED ORDER — INSULIN GLARGINE 100 UNIT/ML ~~LOC~~ SOLN
12.0000 [IU] | Freq: Every day | SUBCUTANEOUS | Status: DC
  Administered 2017-09-10 – 2017-09-12 (×3): 12 [IU] via SUBCUTANEOUS
  Filled 2017-09-10 (×3): qty 0.12

## 2017-09-10 MED ORDER — LIVING WELL WITH DIABETES BOOK
Freq: Once | Status: DC
  Filled 2017-09-10: qty 1

## 2017-09-10 MED ORDER — ALBUTEROL SULFATE (2.5 MG/3ML) 0.083% IN NEBU
2.5000 mg | INHALATION_SOLUTION | Freq: Once | RESPIRATORY_TRACT | Status: AC
  Administered 2017-09-10: 2.5 mg via RESPIRATORY_TRACT

## 2017-09-10 NOTE — Progress Notes (Signed)
Progress Note  Patient Name: Ernest [name] Date of Encounter: 09/10/2017  Primary Cardiologist: Johnsie Cancel   Subjective   Patient states he feels better today. His breathing is significantly improved though not back to baseline. He denies chest pain.   Patient states he has made the decision to pursue CABG.  Inpatient Medications    Scheduled Meds: . aspirin EC  81 mg Oral Daily  . atorvastatin  80 mg Oral q1800  . calcitRIOL  0.75 mcg Oral Q M,W,F-HD  . cinacalcet  120 mg Oral Q supper  . insulin aspart  0-5 Units Subcutaneous QHS  . insulin aspart  0-9 Units Subcutaneous TID WC  . insulin aspart  3 Units Subcutaneous TID WC  . insulin glargine  10 Units Subcutaneous QHS  . levETIRAcetam  1,000 mg Oral Daily  . metoprolol tartrate  25 mg Oral BID  . multivitamin  1 tablet Oral Daily  . sevelamer carbonate  2,400 mg Oral TID WC  . sodium chloride flush  3 mL Intravenous Q12H   Continuous Infusions: . sodium chloride    . heparin 1,400 Units/hr (09/10/17 1000)  . nitroGLYCERIN Stopped (09/10/17 1059)   PRN Meds: sodium chloride, acetaminophen, ondansetron (ZOFRAN) IV, sodium chloride flush   Vital Signs    Vitals:   09/10/17 0800 09/10/17 0827 09/10/17 0900 09/10/17 1000  BP: 123/61  107/70   Pulse: 73  74 74  Resp: (!) 24  16 (!) 7  Temp:  (!) 97.5 F (36.4 C)    TempSrc:  Oral    SpO2: 91%  93% 96%  Weight:      Height:        Intake/Output Summary (Last 24 hours) at 09/10/2017 1105 Last data filed at 09/10/2017 1000 Gross per 24 hour  Intake 287 ml  Output -  Net 287 ml   Filed Weights   09/08/17 1430 09/09/17 0507 09/09/17 1530  Weight: 209 lb 14.1 oz (95.2 kg) 210 lb 14.4 oz (95.7 kg) 213 lb 13.5 oz (97 kg)    Telemetry    SR with freq PVCs, short runs of NSVT - Personally Reviewed  ECG    N/a today  Physical Exam   General: NAD, sitting up in chair comfortably. Head: Normocephalic, atraumatic.  Neck: JVD. Lungs: CTAB, no increased  work of breathing. Heart: RRR, no murmurs, rubs or gallops.  Abdomen: soft, NDNT, +BS.  Extremities: No clubbing, cyanosis, edema. Pulses intact; R femoral cath site without hematoma. Neuro: Alert and oriented X 3. Moves all extremities spontaneously. Psych: Normal affect.  Labs    Chemistry Recent Labs  Lab 09/08/17 0046 09/09/17 0318 09/10/17 0644  NA 134* 135 131*  K 4.4 3.9 4.6  CL 94* 94* 93*  CO2 22 28 24   GLUCOSE 407* 233* 238*  BUN 71* 50* 68*  CREATININE 10.39* 8.18* 10.19*  CALCIUM 9.4 8.6* 8.1*  PROT 6.5  --   --   ALBUMIN 3.5  --   --   AST 25  --   --   ALT 13*  --   --   ALKPHOS 50  --   --   BILITOT 0.8  --   --   GFRNONAA 4* 6* 5*  GFRAA 5* 7* 5*  ANIONGAP 18* 13 14     Hematology Recent Labs  Lab 09/08/17 0046 09/09/17 0318 09/10/17 0644  WBC 11.7* 8.6 6.6  RBC 3.88* 3.51* 3.40*  HGB 11.6* 10.4* 9.9*  HCT 34.8* 31.3* 29.5*  MCV 89.7 89.2 86.8  MCH 29.9 29.6 29.1  MCHC 33.3 33.2 33.6  RDW 14.7 14.7 14.3  PLT 145* 168 199    Cardiac Enzymes Recent Labs  Lab 09/08/17 0939 09/08/17 1221 09/08/17 2139  TROPONINI 8.33* 10.11* 8.61*    Recent Labs  Lab 09/08/17 0049  TROPIPOC 6.48*     BNP Recent Labs  Lab 09/08/17 0046  BNP 4,125.9*     DDimer No results for input(s): DDIMER in the last 168 hours.    Radiology    No results found.  Cardiac Studies   TTE: 09/08/17  Study Conclusions  - Left ventricle: Inferobasal and posterior lateral hypokinesis EF   similar to echo from 2016. The cavity size was normal. Wall   thickness was normal. Systolic function was mildly reduced. The   estimated ejection fraction was in the range of 45% to 50%. - Mitral valve: Calcified annulus. Mildly thickened leaflets .   There was mild regurgitation. - Left atrium: The atrium was moderately dilated. - Pulmonary arteries: PA peak pressure: 50 mm Hg (S).  Cath: 11/13  Cath 08/26/2012 Left mainstem: There is a 50% eccentric ostial  left main stenosis.  Left anterior descending (LAD): The LAD is heavily calcified throughout the proximal vessel. There is 50-70% stenosis in the proximal vessel followed by segment of 40% disease in the proximal to mid vessel.  The ramus intermediate branch is moderate in size and has mild disease at the ostium up to 30%.  Left circumflex (LCx): The left circumflex has minor irregularities up to 20%.  Right coronary artery (RCA): The right coronary has a 50% stenosis in the proximal vessel. The remainder of the vessel is without significant disease.  Left ventriculography: There is apical hypokinesis with mild left ventricular dysfunction. Ejection fraction is estimated at 50%.  Final Conclusions:  1. There is modest disease involving the ostial left main, proximal LAD and proximal right coronary. These lesions do not appear to be hemodynamically significant. 2. Mild left ventricular dysfunction with apical hypokinesis.  Recommendations: Cardiac catheterization findings do not correlate well with his Myoview study. His ejection fraction looks better angiographically than 40%. Patient has been asymptomatic. I would recommend medical management.  LHC 09/09/2017:  Ost LM lesion is 65% stenosed.  Mid LM lesion is 90% stenosed.  Ost Cx to Prox Cx lesion is 100% stenosed.  Ost LAD lesion is 20% stenosed.  Acute Mrg lesion is 60% stenosed. Significant coronary calcification with high-grade 65% ostial and 90-95% distal left main stenosis, calcification of the proximal LAD with 20-25% narrowing, large ramus intermediate vessel; total occlusion of the left circumflex at the ostium.  Dominant RCA with 60% ostial proximal narrowing in an RV marginal branch and collateralization to the distal circumflex. Recommend surgical consult for CABG.  Patient Profile     71 y.o. male with a hx of CAD, chronic systolic HF, COPD, ESRD on HD MWF, HTN, HLD, DM II and h/o CVA who presented with acute  on chronic systolic HF and found to have an elevated troponin.   Assessment & Plan    NSTEMI CAD:  Patient with multivessel CAD and 90-95% left main stenosis - recommendation is for CABG. Dr. Roxan Hockey evaluated and spoke with patient and wife - this morning patient states he wants to pursue surgery. Last plavix dose was 11/26 - should be able to go for surgery on Friday 11/30 if spot available. --asa 81mg  daily --atorvastatin 80mg  daily --metoprolol 25mg  BID --heparin gtt --CTS on  board for CABG  NSVT, PVCS:  Continued intermittent bigeminy, trigeminy. He remains without symptoms.   Acute on chronic systolic HF:  EF stable on echo this admission. Nephrology following for HD. He is still requiring 3L [place] supplemental oxygen at rest to maintain O2 at 93%.  ERSD on HD MWF:  HD today to continue working on lowering dry weight.  DM: Management per primary  Signed, Alphonzo Grieve, MD  09/10/2017, 11:05 AM   For questions or updates, please contact [place] Please consult [email or web address] for contact info under Cardiology/STEMI.  I have examined the patient and reviewed assessment and plan and discussed with patient.  Agree with above as stated.  Undergoing CABG w/u.  No angina at this time.  Plavix washout.  Dialysis continuing.  Watching in ICU due to severe left main disease and volume overload.   Larae Grooms

## 2017-09-10 NOTE — Progress Notes (Signed)
   Subjective: Ernest [name] was seen laying in his bed this morning eating breakfast this morning. He denies any chest pain or shortness of breath. He did not have any new complaints that he wished to discuss.   Objective:  Vital signs in last 24 hours: Vitals:   09/10/17 0800 09/10/17 0827 09/10/17 0900 09/10/17 1000  BP: 123/61  107/70   Pulse: 73  74 74  Resp: (!) 24  16 (!) 7  Temp:  (!) 97.5 F (36.4 C)    TempSrc:  Oral    SpO2: 91%  93% 96%  Weight:      Height:       Physical Exam  Constitutional: He appears well-developed and well-nourished.  Non-toxic appearance. He does not appear ill.  HENT:  Head: Normocephalic and atraumatic.  Cardiovascular: Normal rate, regular rhythm, normal heart sounds and intact distal pulses.  Pulmonary/Chest: Effort normal and breath sounds normal. No accessory muscle usage. No respiratory distress.  Abdominal: Soft. Bowel sounds are normal. He exhibits no distension. There is no tenderness.  Musculoskeletal:       Right lower leg: He exhibits no edema.       Left lower leg: He exhibits no edema.  Neurological: He is alert.    Assessment/Plan:  Dyspnea due toAcute on chronic combined systolic and diastolic congestive heart failure thought to be due toNSTEMI  The patient had left heart catheterization done yesterday 09/09/17 which showed   Ost LM lesion is 65% stenosed.  Mid LM lesion is 90% stenosed.  Ost Cx to Prox Cx lesion is 100% stenosed.  Ost LAD lesion is 20% stenosed.    Acute Mrg lesion is 60% stenosed.   Based on the above results it was deemed that the patient needs CABG done. CT surgery has spoken to the patient and his wife about the procedure and they expressed that they would like to think about it. The plan is to do CABG on September 13, 2017.   -Continue heparin drip -Aspirin 81mg  given -Hemodialysis for chf- hemodialysis expected for today -Heart healthy diet  End-stage renal disease  -Continue  Calcitrol 0.75 MCG Monday Wednesday Friday -Continue Cinacalcetat 120 mg daily -Continue sevelamer 2400 mg 3 times daily -Continue multivitamin  Diabetesmellitus type II  The patient's blood glucose has been in the 200s -Increased patient's lantus 10u to 12u qhs -3 units aspart with meals -SSI-changed from sensitive to moderate   Essential hypertension  Patient's blood pressure over the past 24 hours has ranged 97-149/66-128 -Continue metoprolol 50 mg twice daily  Coronary artery disease -Continue clopidogrel 75 mg daily -Aspirin 325mg  given -Continue atorvastatin 40 mg nightly  DVT RAQ:[id number]  Dispo: Anticipated discharge in approximately 2-3 days.   Lars Mage, MD Internal Medicine PGY1 Pager:[phone] 09/10/2017, 11:22 AM

## 2017-09-10 NOTE — H&P (View-Only) (Signed)
Reason for Consult:Left main disease s/p NSTEMI Referring Physician: Dr. [name] is an 71 y.o. male.  HPI: 71 yo man who presented with a chief complaint of shortness of breath.  Ernest [name] is a 71 year old man with a complex medical history including insulin-dependent diabetes mellitus complicated by end-stage renal disease on hemodialysis, coronary artery disease, chronic systolic congestive heart failure, hepatitis C, hyperlipidemia, hypertension, obesity, diabetic retinopathy, 5 previous strokes, and gout.  His most recent stroke was over a year ago.  His wife says that they have affected his balance but not left him with significant weakness.  He is a poor historian.  He presented to the emergency room on 09/08/2017 with a 2-day history of worsening shortness of breath.  He also complained of coughing and chest tightness.  He denied any chest pain or pressure.  He was admitted with acute on chronic systolic and diastolic heart failure.  He ruled in for a non-ST elevation MI.  He underwent cardiac catheterization yesterday which revealed a 95% distal left main stenosis.  He also had proximal stenosis in acute marginal branch of the right coronary.  He currently is asymptomatic he did have a brief episode of shortness of breath and chest tightness this morning.  Past Medical History:  Diagnosis Date  . Abnormal stress test    s/p cath November 2013 with modest disease involving the ostial left main, proximal LAD, proximal RCA - do not appear to be hemodynamically signficant; mild LV dysfunction  . Bacteremia   . Chronic systolic CHF (congestive heart failure) ([place])   . COPD (chronic obstructive pulmonary disease) ([place])   . Coronary artery disease    per cath report 2013  . Ejection fraction < 50%   . Erectile dysfunction    penile implant  . ESRD (end stage renal disease) ([place])    Started HD in [place] in 2009, ESRD was due to DM. Moved to [facility] in Dec 2009 and now gets  dialysis at [facility] on a MWF schedule.     . Gout   . Gout    Hx: of  . Hepatitis C   . Hyperlipidemia   . Hypertension   . Obesity   . Retinopathy   . Shortness of breath    Hx: of with exertion  . Stroke ([place])   . Type II or unspecified type diabetes mellitus without mention of complication, not stated as uncontrolled    adult onset    Past Surgical History:  Procedure Laterality Date  . AV FISTULA PLACEMENT Left 01/13/2013   Procedure: INSERTION OF ARTERIOVENOUS (AV) GORE-TEX GRAFT ARM;  Surgeon: Angelia Mould, MD;  Location: [place];  Service: Vascular;  Laterality: Left;  . AV FISTULA PLACEMENT Left 05/05/2013   Procedure: INSERTION OF LEFT UPPER ARM  ARTERIOVENOUS GORTEX GRAFT;  Surgeon: Angelia Mould, MD;  Location: [place];  Service: Vascular;  Laterality: Left;  . [place] REMOVAL Left 03/26/2013   Procedure: REMOVAL OF  NON INCORPORATED ARTERIOVENOUS GORETEX GRAFT ([place]) left arm * repair of  left brachial artery with vein patch angioplasty.;  Surgeon: Angelia Mould, MD;  Location: [place];  Service: Vascular;  Laterality: Left;  . CARDIAC CATHETERIZATION  August 26, 2012  . CATARACT EXTRACTION W/ INTRAOCULAR LENS  IMPLANT, BILATERAL    . COLONOSCOPY    . EMBOLECTOMY Right 08/27/2013   Procedure: EMBOLECTOMY;  Surgeon: Mal Misty, MD;  Location: [place];  Service: Vascular;  Laterality: Right;  Thrombectomy of Radial and ulnar artery.  . EYE SURGERY     laser.  and surgery for DM  . fistula     RUE and wrist  . INSERTION OF DIALYSIS CATHETER Right 01/01/2013   Procedure: INSERTION OF DIALYSIS CATHETER right  internal jugular;  Surgeon: Vance W Brabham, MD;  [facility];  Service: Vascular;  Laterality: Right;  . INSERTION OF DIALYSIS CATHETER N/A 03/26/2013   Procedure: INSERTION OF DIALYSIS CATHETER Left internal jugular vein;  Surgeon: Christopher S Dickson, MD;  [facility];  Service: Vascular;  Laterality: N/A;  . LEFT HEART CATH AND CORONARY  ANGIOGRAPHY N/A 09/09/2017   Procedure: LEFT HEART CATH AND CORONARY ANGIOGRAPHY;  Surgeon: Kelly, Thomas A, MD;  [facility];  Service: Cardiovascular;  Laterality: N/A;  . LIGATION OF ARTERIOVENOUS  FISTULA Right 12/30/2013   Procedure: REMOVAL OF SEGMENT OF GORTEX GRAFT AND FISTULA  AND REPAIR OF BRACHIAL ARTERY;  Surgeon: James D Lawson, MD;  [facility]: [facility];  Service: Vascular;  Laterality: Right;  . PENILE PROSTHESIS IMPLANT     1997.. no card  . TEE WITHOUT CARDIOVERSION N/A 06/11/2013   Procedure: TRANSESOPHAGEAL ECHOCARDIOGRAM (TEE);  Surgeon: Dalton S McLean, MD;  [facility];  Service: Cardiovascular;  Laterality: N/A;  . THROMBECTOMY W/ EMBOLECTOMY Left 03/26/2013   Procedure: Attempted thrombectomy of left arm arteriovenous goretex graft.;  Surgeon: Christopher S Dickson, MD;  [facility];  Service: Vascular;  Laterality: Left;  . VENOGRAM N/A 04/07/2013   Procedure: VENOGRAM;  Surgeon: Vance W Brabham, MD;  [facility];  Service: Cardiovascular;  Laterality: N/A;    Family History  Problem Relation Age of Onset  . Heart disease Father   . Diabetes Sister   . Diabetes Brother   . Diabetes Son     Social History:  reports that he quit smoking about 44 years ago. He quit after 7.00 years of use. he has never used smokeless tobacco. He reports that he does not drink alcohol or use drugs.  Allergies: [name]  Medications:  Scheduled: . aspirin EC  81 mg Oral Daily  . atorvastatin  80 mg Oral q1800  . calcitRIOL  0.75 mcg Oral Q M,W,F-HD  . cinacalcet  120 mg Oral Q supper  . insulin aspart  0-5 Units Subcutaneous QHS  . insulin aspart  0-9 Units Subcutaneous TID WC  . insulin aspart  3 Units Subcutaneous TID WC  . insulin glargine  10 Units Subcutaneous QHS  . levETIRAcetam  1,000 mg Oral Daily  . metoprolol tartrate  25 mg Oral BID  . multivitamin  1 tablet Oral Daily  . sevelamer carbonate  2,400 mg Oral TID WC  .  sodium chloride flush  3 mL Intravenous Q12H    Results for orders placed or performed during the hospital encounter of 09/07/17 (from the past 48 hour(s))  Glucose, capillary     Status: Abnormal   Collection Time: 09/08/17 11:30 AM  Result Value Ref Range   Glucose-Capillary 288 (H) 65 - 99 mg/dL  Troponin I     Status: Abnormal   Collection Time: 09/08/17 12:21 PM  Result Value Ref Range   Troponin I 10.11 (HH) <0.03 ng/mL    Comment: CRITICAL VALUE NOTED.  VALUE IS CONSISTENT WITH PREVIOUSLY REPORTED AND CALLED VALUE.  Glucose, capillary     Status: Abnormal   Collection Time: 09/08/17  4:34 PM  Result Value Ref Range   Glucose-Capillary   220 (H) 65 - 99 mg/dL  Heparin level (unfractionated)     Status: None   Collection Time: 09/08/17  7:01 PM  Result Value Ref Range   Heparin Unfractionated 0.31 0.30 - 0.70 IU/mL    Comment:        IF HEPARIN RESULTS ARE BELOW EXPECTED VALUES, AND PATIENT DOSAGE HAS BEEN CONFIRMED, SUGGEST FOLLOW UP TESTING OF ANTITHROMBIN III LEVELS.   Glucose, capillary     Status: Abnormal   Collection Time: 09/08/17  9:19 PM  Result Value Ref Range   Glucose-Capillary 209 (H) 65 - 99 mg/dL   Comment 1 Notify RN   Troponin I     Status: Abnormal   Collection Time: 09/08/17  9:39 PM  Result Value Ref Range   Troponin I 8.61 (HH) <0.03 ng/mL    Comment: CRITICAL VALUE NOTED.  VALUE IS CONSISTENT WITH PREVIOUSLY REPORTED AND CALLED VALUE.  CBC     Status: Abnormal   Collection Time: 09/09/17  3:18 AM  Result Value Ref Range   WBC 8.6 4.0 - 10.5 K/uL   RBC 3.51 (L) 4.22 - 5.81 MIL/uL   Hemoglobin 10.4 (L) 13.0 - 17.0 g/dL   HCT 31.3 (L) 39.0 - 52.0 %   MCV 89.2 78.0 - 100.0 fL   MCH 29.6 26.0 - 34.0 pg   MCHC 33.2 30.0 - 36.0 g/dL   RDW 14.7 11.5 - 15.5 %   Platelets 168 150 - 400 K/uL  Basic metabolic panel     Status: Abnormal   Collection Time: 09/09/17  3:18 AM  Result Value Ref Range   Sodium 135 135 - 145 mmol/L   Potassium 3.9 3.5 -  5.1 mmol/L   Chloride 94 (L) 101 - 111 mmol/L   CO2 28 22 - 32 mmol/L   Glucose, Bld 233 (H) 65 - 99 mg/dL   BUN 50 (H) 6 - 20 mg/dL   Creatinine, Ser 8.18 (H) 0.61 - 1.24 mg/dL   Calcium 8.6 (L) 8.9 - 10.3 mg/dL   GFR calc non Af Amer 6 (L) >60 mL/min   GFR calc Af Amer 7 (L) >60 mL/min    Comment: (NOTE) The eGFR has been calculated using the CKD EPI equation. This calculation has not been validated in all clinical situations. eGFR's persistently <60 mL/min signify possible Chronic Kidney Disease.    Anion gap 13 5 - 15  Heparin level (unfractionated)     Status: Abnormal   Collection Time: 09/09/17  3:18 AM  Result Value Ref Range   Heparin Unfractionated 0.23 (L) 0.30 - 0.70 IU/mL    Comment:        IF HEPARIN RESULTS ARE BELOW EXPECTED VALUES, AND PATIENT DOSAGE HAS BEEN CONFIRMED, SUGGEST FOLLOW UP TESTING OF ANTITHROMBIN III LEVELS.   Protime-INR     Status: None   Collection Time: 09/09/17  3:18 AM  Result Value Ref Range   Prothrombin Time 14.9 11.4 - 15.2 seconds   INR 1.18   Glucose, capillary     Status: Abnormal   Collection Time: 09/09/17  7:54 AM  Result Value Ref Range   Glucose-Capillary 174 (H) 65 - 99 mg/dL  POCT Activated clotting time     Status: None   Collection Time: 09/09/17  1:28 PM  Result Value Ref Range   Activated Clotting Time 136 seconds  Glucose, capillary     Status: Abnormal   Collection Time: 09/09/17  1:57 PM  Result Value Ref Range   Glucose-Capillary 208 (  H) 65 - 99 mg/dL  Glucose, capillary     Status: Abnormal   Collection Time: 09/09/17  4:43 PM  Result Value Ref Range   Glucose-Capillary 217 (H) 65 - 99 mg/dL   Comment 1 Notify RN   CBC     Status: Abnormal   Collection Time: 09/10/17  6:44 AM  Result Value Ref Range   WBC 6.6 4.0 - 10.5 K/uL   RBC 3.40 (L) 4.22 - 5.81 MIL/uL   Hemoglobin 9.9 (L) 13.0 - 17.0 g/dL   HCT 29.5 (L) 39.0 - 52.0 %   MCV 86.8 78.0 - 100.0 fL   MCH 29.1 26.0 - 34.0 pg   MCHC 33.6 30.0 - 36.0  g/dL   RDW 14.3 11.5 - 15.5 %   Platelets 199 150 - 400 K/uL  Basic metabolic panel     Status: Abnormal   Collection Time: 09/10/17  6:44 AM  Result Value Ref Range   Sodium 131 (L) 135 - 145 mmol/L   Potassium 4.6 3.5 - 5.1 mmol/L   Chloride 93 (L) 101 - 111 mmol/L   CO2 24 22 - 32 mmol/L   Glucose, Bld 238 (H) 65 - 99 mg/dL   BUN 68 (H) 6 - 20 mg/dL   Creatinine, Ser 10.19 (H) 0.61 - 1.24 mg/dL   Calcium 8.1 (L) 8.9 - 10.3 mg/dL   GFR calc non Af Amer 5 (L) >60 mL/min   GFR calc Af Amer 5 (L) >60 mL/min    Comment: (NOTE) The eGFR has been calculated using the CKD EPI equation. This calculation has not been validated in all clinical situations. eGFR's persistently <60 mL/min signify possible Chronic Kidney Disease.    Anion gap 14 5 - 15  Heparin level (unfractionated)     Status: None   Collection Time: 09/10/17  6:44 AM  Result Value Ref Range   Heparin Unfractionated 0.34 0.30 - 0.70 IU/mL    Comment:        IF HEPARIN RESULTS ARE BELOW EXPECTED VALUES, AND PATIENT DOSAGE HAS BEEN CONFIRMED, SUGGEST FOLLOW UP TESTING OF ANTITHROMBIN III LEVELS.   Glucose, capillary     Status: Abnormal   Collection Time: 09/10/17  8:02 AM  Result Value Ref Range   Glucose-Capillary 286 (H) 65 - 99 mg/dL  Glucose, capillary     Status: Abnormal   Collection Time: 09/10/17  8:24 AM  Result Value Ref Range   Glucose-Capillary 238 (H) 65 - 99 mg/dL   Comment 1 Capillary Specimen    Comment 2 Notify RN     No results found.  Review of Systems  Constitutional: Positive for malaise/fatigue. Negative for chills and fever.  Respiratory: Positive for shortness of breath.   Cardiovascular: Positive for orthopnea and leg swelling. Negative for chest pain.  Genitourinary:       Renal failure  Musculoskeletal:       Gait disturbance  Neurological: Positive for dizziness.       Problems with balance  All other systems reviewed and are negative.  Blood pressure 107/70, pulse 74,  temperature (!) 97.5 F (36.4 C), temperature source Oral, resp. rate (!) 7, height 5' 9" (1.753 m), weight 213 lb 13.5 oz (97 kg), SpO2 96 %. Physical Exam  Vitals reviewed. Constitutional: He is oriented to person, place, and time. He appears well-developed. No distress.  HENT:  Head: Normocephalic and atraumatic.  Mouth/Throat: No oropharyngeal exudate.  Eyes: Conjunctivae are normal. Scleral icterus (Mild) is present.  Neck:  Neck supple. No thyromegaly present.  No carotid bruit  Cardiovascular: Normal rate and regular rhythm. Exam reveals gallop (S4). Exam reveals no friction rub.  No murmur heard. Respiratory: Effort normal. No respiratory distress. He has no wheezes. He has no rales.  GI: Soft. He exhibits no distension. There is no tenderness.  Musculoskeletal: He exhibits no edema.  Lymphadenopathy:    He has no cervical adenopathy.  Neurological: He is alert and oriented to person, place, and time. No cranial nerve deficit. He exhibits normal muscle tone. Coordination abnormal.  Skin: Skin is warm and dry.   Cardiac catheterization onclusion     Ost LM lesion is 65% stenosed.  Mid LM lesion is 90% stenosed.  Ost Cx to Prox Cx lesion is 100% stenosed.  Ost LAD lesion is 20% stenosed.  Acute Mrg lesion is 60% stenosed.   Significant coronary calcification with high-grade 65% ostial and 90-95% distal left main stenosis, calcification of the proximal LAD with 20-25% narrowing, large ramus intermediate vessel; total occlusion of the left circumflex at the ostium.  Dominant RCA with 60% ostial proximal narrowing in an RV marginal branch and collateralization to the distal circumflex.  LVEDP 14 mm Hg.  RECOMMENDATION: Surgical consultation for CABG revascularization.   I personally reviewed the cath images and concur with the findings noted above.  Assessment/Plan: Ernest [name] is a 71 year old gentleman with multiple significant medical problems including poorly  controlled insulin-dependent type 2 diabetes complicated by nephropathy and retinopathy, end-stage renal disease requiring hemodialysis, chronic systolic and diastolic heart failure, hypertension, hyperlipidemia, and previous CVA.  He do not have full records but according to his wife he has had 5 previous strokes the most recent over a year ago.  She says these have primarily affected his balance and he does walk with a cane.  He is however ambulatory.  He has significant left main disease and moderate left ventricular dysfunction.  He is a high risk candidate for CABG due to his multiple comorbidities.  The STS calculator estimates his predicted mortality at 12%, stroke at 5%, and major morbidity or mortality at 45%.   Had a long discussion with Mr. and Mrs. [name] yesterday evening.  I described the general nature of the operation to them including the incisions to be used, the use of cardiopulmonary bypass, the expected postoperative stay, and the expected overall recovery.  They do understand he is a high risk candidate.  I informed him of the indications, risks, benefits, and alternatives.  They understand the risks include, but are not limited to death, stroke, MI, DVT, PE, bleeding, possible need for transfusion, infection, cardiac arrhythmias, respiratory failure, gastrointestinal complications, as well as possibility of other operative complications.  They wish to think over their options and talk to their family before making a final decision.  He was on Plavix prior to admission.  I stopped that last night.  Friday would be 4 days off Plavix and I think acceptable bleeding risk at that point in time.  If he chooses to proceed with CABG we will plan to do so on Friday, September 13, 2017    Melrose Nakayama 09/10/2017, 10:39 AM

## 2017-09-10 NOTE — Progress Notes (Signed)
Carotid duplex prelim: 1-39% ICA stenosis. UE Doppler prelim: Right- obliterates with radial comp, normal with ulnar comp. Left- Normal. Pedal artery waveforms WNL. Landry Mellow, RDMS, RVT

## 2017-09-10 NOTE — Progress Notes (Addendum)
Inpatient Diabetes Program Recommendations  AACE/ADA: New Consensus Statement on Inpatient Glycemic Control (2015)  Target Ranges:  Prepandial:   less than 140 mg/dL      Peak postprandial:   less than 180 mg/dL (1-2 hours)      Critically ill patients:  140 - 180 mg/dL   Results for Ernest [name], Ernest [name] (MRN [id number]) as of 09/10/2017 08:00  Ref. Range 10/28/2016 04:42 09/08/2017 08:10  Hemoglobin A1C Latest Ref Range: 4.8 - 5.6 % 8.7 (H) 9.1 (H)   Review of Glycemic Control  Diabetes history: DM2 Outpatient Diabetes medications: Lantus 10 units QHS, Humalog 3 units TID with meals Current orders for Inpatient glycemic control: Lantus 10 units QHS, Novolog 0-9 units TID with meals, Novolog 0-5 units QHS, Novolog 3 units TID with meals for meal coverage  Inpatient Diabetes Program Recommendations: Insulin - Basal: Please consider increasing Lantus to 13 units QHS. HgbA1C: A1C 9.1% on 09/08/17 indicating an average glucose of 214 mg/dl over the past 2-3 months. Patient needs to follow up with PCP regarding improve DM control.  Addendum 09/10/17@14 :07-Spoke with patient and his wife over the phone (Diabetes Coordinator working from [employer]) about diabetes and home regimen for diabetes control. Patient was not able to answer question about insulins or glucose trends and asked that I talk with his wife. Patient's wife reports that he is followed by PCP for diabetes management and currently he takes Lantus 10 units QHS QHS and Humalog 3 units TID with meals as an outpatient for diabetes control. Patient's wife reports that patient is taking insulin as prescribed.  Patient's wife reports that patient's glucose is usually 150-190 mg/dl when checked at home.  Inquired about prior A1C and patient's wife states she does not know what an A1C is. Explained what an A1C is and discussed patient's current A1C results (9.1% on 09/08/17) and explained that his current A1C indicates an average glucose of  214 mg/dl over the past 2-3 months. Discussed glucose and A1C goals. Discussed importance of checking CBGs and maintaining good CBG control to prevent long-term and short-term complications. Explained how hyperglycemia leads to damage within blood vessels which lead to the common complications seen with uncontrolled diabetes. Stressed the importance of improving glycemic control to prevent further complications from uncontrolled diabetes and especially given patient is planning to have CABG.  Informed patient's wife that the Living Well with Diabetes booklet would be ordered and asked that she and her husband review it. Patient's wife stated that her husband has had DM for many years and they do not need or want the book. Explained that the book has more information about importance of DM control, more information on diet, A1C, etc.and again patient's wife stated she did not want the book as they did not need it.  Patient's wife verbalized understanding of information discussed and she states that she has no further questions at this time related to diabetes. Will continue to follow. Ordered [facility] CM consult for outpatient follow up.  Thanks, Barnie Alderman, RN, MSN, CDE Diabetes Coordinator Inpatient Diabetes Program [phone] (Team Pager from 8am to 5pm)

## 2017-09-10 NOTE — Progress Notes (Signed)
ANTICOAGULATION CONSULT NOTE - Follow Up Consult  Pharmacy Consult for heparin Indication: NSTEMI  Labs: Recent Labs    09/08/17 0046 09/08/17 0939 09/08/17 1221 09/08/17 1901 09/08/17 2139 09/09/17 0318 09/10/17 0644  HGB 11.6*  --   --   --   --  10.4* 9.9*  HCT 34.8*  --   --   --   --  31.3* 29.5*  PLT 145*  --   --   --   --  168 199  LABPROT  --   --   --   --   --  14.9  --   INR  --   --   --   --   --  1.18  --   HEPARINUNFRC  --   --   --  0.31  --  0.23* 0.34  CREATININE 10.39*  --   --   --   --  8.18* 10.19*  TROPONINI  --  8.33* 10.11*  --  8.61*  --   --     Assessment: 71yo male on heparin for ACS. Cath report showed significant calcification (65% Ost LM, 90-95% mid LM, 100% Ost Cx to Prox Cx, and 60% at acute Mrg), recommending surgical consultation for CABG. Heparin restarted 8 hours after sheath removal (10/26 at 1415).  Hgb down from 11.6 to 9.9 post-surgery. Platelets remain stable. No signs/symptoms of bleeding documented.  Goal of Therapy:  Heparin level 0.3-0.7 units/ml   Plan:  Continue heparin at 1400 units/hr Monitor daily HL, CBC, and s/sx of bleeding Follow along for plan by [facility] for surgery   Angus Seller, PharmD Pharmacy Resident [phone] 09/10/2017 7:46 AM

## 2017-09-10 NOTE — Care Management Note (Signed)
Case Management Note Marvetta Gibbons RN, BSN Unit 4E-Case Manager-- [facility] coverage [phone]  Patient Details  Name: [name] MRN: [id number] Date of Birth: [birth year]  Subjective/Objective:    Pt admitted with NSTEMI- s/p cath with CAD found- needs CABG- pt and family thinking on surgery - per CVTS can be done on 11/30 if they decide to move forward after plavix wash out               Action/Plan: PTA pt lived at home wife- uses cane- ambulatory- hx ESRD on HD- CM to follow for d/c needs pending surgery   Expected Discharge Date:                 Expected Discharge Plan:     In-House Referral:     Discharge planning Services  CM Consult  Post Acute Care Choice:    Choice offered to:     DME Arranged:    DME Agency:     HH Arranged:    HH Agency:     Status of Service:  In process, will continue to follow  If discussed at Long Length of Stay Meetings, dates discussed:    Discharge Disposition:   Additional Comments:  Dawayne Patricia, RN 09/10/2017, 11:00 AM

## 2017-09-10 NOTE — Consult Note (Signed)
Reason for Consult:Left main disease s/p NSTEMI Referring Physician: Dr. Vonna Kotyk is an 71 y.o. male.  HPI: 71 yo man who presented with a chief complaint of shortness of breath.  Ernest [name] is a 71 year old man with a complex medical history including insulin-dependent diabetes mellitus complicated by end-stage renal disease on hemodialysis, coronary artery disease, chronic systolic congestive heart failure, hepatitis C, hyperlipidemia, hypertension, obesity, diabetic retinopathy, 5 previous strokes, and gout.  His most recent stroke was over a year ago.  His wife says that they have affected his balance but not left him with significant weakness.  He is a poor historian.  He presented to the emergency room on 09/08/2017 with a 2-day history of worsening shortness of breath.  He also complained of coughing and chest tightness.  He denied any chest pain or pressure.  He was admitted with acute on chronic systolic and diastolic heart failure.  He ruled in for a non-ST elevation MI.  He underwent cardiac catheterization yesterday which revealed a 95% distal left main stenosis.  He also had proximal stenosis in acute marginal branch of the right coronary.  He currently is asymptomatic he did have a brief episode of shortness of breath and chest tightness this morning.  Past Medical History:  Diagnosis Date  . Abnormal stress test    s/p cath November 2013 with modest disease involving the ostial left main, proximal LAD, proximal RCA - do not appear to be hemodynamically signficant; mild LV dysfunction  . Bacteremia   . Chronic systolic CHF (congestive heart failure) ([place])   . COPD (chronic obstructive pulmonary disease) ([place])   . Coronary artery disease    per cath report 2013  . Ejection fraction < 50%   . Erectile dysfunction    penile implant  . ESRD (end stage renal disease) ([place])    Started HD in [place] in 2009, ESRD was due to DM. Moved to [facility] in Dec 2009 and now gets  dialysis at [facility] on a MWF schedule.     . Gout   . Gout    Hx: of  . Hepatitis C   . Hyperlipidemia   . Hypertension   . Obesity   . Retinopathy   . Shortness of breath    Hx: of with exertion  . Stroke ([place])   . Type II or unspecified type diabetes mellitus without mention of complication, not stated as uncontrolled    adult onset    Past Surgical History:  Procedure Laterality Date  . AV FISTULA PLACEMENT Left 01/13/2013   Procedure: INSERTION OF ARTERIOVENOUS (AV) GORE-TEX GRAFT ARM;  Surgeon: Angelia Mould, MD;  Location: [place];  Service: Vascular;  Laterality: Left;  . AV FISTULA PLACEMENT Left 05/05/2013   Procedure: INSERTION OF LEFT UPPER ARM  ARTERIOVENOUS GORTEX GRAFT;  Surgeon: Angelia Mould, MD;  Location: [place];  Service: Vascular;  Laterality: Left;  . Diaz REMOVAL Left 03/26/2013   Procedure: REMOVAL OF  NON INCORPORATED ARTERIOVENOUS GORETEX GRAFT ([place]) left arm * repair of  left brachial artery with vein patch angioplasty.;  Surgeon: Angelia Mould, MD;  Location: [place];  Service: Vascular;  Laterality: Left;  . CARDIAC CATHETERIZATION  August 26, 2012  . CATARACT EXTRACTION W/ INTRAOCULAR LENS  IMPLANT, BILATERAL    . COLONOSCOPY    . EMBOLECTOMY Right 08/27/2013   Procedure: EMBOLECTOMY;  Surgeon: Mal Misty, MD;  Location: [place];  Service: Vascular;  Laterality: Right;  Thrombectomy of Radial and ulnar artery.  [name] EYE SURGERY     laser.  and surgery for DM  . fistula     RUE and wrist  . INSERTION OF DIALYSIS CATHETER Right 01/01/2013   Procedure: INSERTION OF DIALYSIS CATHETER right  internal jugular;  Surgeon: Serafina Mitchell, MD;  Location: [place];  Service: Vascular;  Laterality: Right;  . INSERTION OF DIALYSIS CATHETER N/A 03/26/2013   Procedure: INSERTION OF DIALYSIS CATHETER Left internal jugular vein;  Surgeon: Angelia Mould, MD;  Location: [place];  Service: Vascular;  Laterality: N/A;  . LEFT HEART CATH AND CORONARY  ANGIOGRAPHY N/A 09/09/2017   Procedure: LEFT HEART CATH AND CORONARY ANGIOGRAPHY;  Surgeon: Troy Sine, MD;  Location: [place] CV LAB;  Service: Cardiovascular;  Laterality: N/A;  . LIGATION OF ARTERIOVENOUS  FISTULA Right 12/30/2013   Procedure: REMOVAL OF SEGMENT OF GORTEX GRAFT AND FISTULA  AND REPAIR OF BRACHIAL ARTERY;  Surgeon: Mal Misty, MD;  Location: [place];  Service: Vascular;  Laterality: Right;  . PENILE PROSTHESIS IMPLANT     1997.. no card  . TEE WITHOUT CARDIOVERSION N/A 06/11/2013   Procedure: TRANSESOPHAGEAL ECHOCARDIOGRAM (TEE);  Surgeon: Larey Dresser, MD;  Location: [place];  Service: Cardiovascular;  Laterality: N/A;  . THROMBECTOMY W/ EMBOLECTOMY Left 03/26/2013   Procedure: Attempted thrombectomy of left arm arteriovenous goretex graft.;  Surgeon: Angelia Mould, MD;  Location: [place];  Service: Vascular;  Laterality: Left;  [name] VENOGRAM N/A 04/07/2013   Procedure: VENOGRAM;  Surgeon: Serafina Mitchell, MD;  Location: [facility] CATH LAB;  Service: Cardiovascular;  Laterality: N/A;    Family History  Problem Relation Age of Onset  . Heart disease Father   . Diabetes Sister   . Diabetes Brother   . Diabetes Son     Social History:  reports that he quit smoking about 44 years ago. He quit after 7.00 years of use. he has never used smokeless tobacco. He reports that he does not drink alcohol or use drugs.  Allergies: [name]  Medications:  Scheduled: . aspirin EC  81 mg Oral Daily  . atorvastatin  80 mg Oral q1800  . calcitRIOL  0.75 mcg Oral Q M,W,F-HD  . cinacalcet  120 mg Oral Q supper  . insulin aspart  0-5 Units Subcutaneous QHS  . insulin aspart  0-9 Units Subcutaneous TID WC  . insulin aspart  3 Units Subcutaneous TID WC  . insulin glargine  10 Units Subcutaneous QHS  . levETIRAcetam  1,000 mg Oral Daily  . metoprolol tartrate  25 mg Oral BID  . multivitamin  1 tablet Oral Daily  . sevelamer carbonate  2,400 mg Oral TID WC  .  sodium chloride flush  3 mL Intravenous Q12H    Results for orders placed or performed during the hospital encounter of 09/07/17 (from the past 48 hour(s))  Glucose, capillary     Status: Abnormal   Collection Time: 09/08/17 11:30 AM  Result Value Ref Range   Glucose-Capillary 288 (H) 65 - 99 mg/dL  Troponin I     Status: Abnormal   Collection Time: 09/08/17 12:21 PM  Result Value Ref Range   Troponin I 10.11 (HH) <0.03 ng/mL    Comment: CRITICAL VALUE NOTED.  VALUE IS CONSISTENT WITH PREVIOUSLY REPORTED AND CALLED VALUE.  Glucose, capillary     Status: Abnormal   Collection Time: 09/08/17  4:34 PM  Result Value Ref Range   Glucose-Capillary  220 (H) 65 - 99 mg/dL  Heparin level (unfractionated)     Status: None   Collection Time: 09/08/17  7:01 PM  Result Value Ref Range   Heparin Unfractionated 0.31 0.30 - 0.70 IU/mL    Comment:        IF HEPARIN RESULTS ARE BELOW EXPECTED VALUES, AND PATIENT DOSAGE HAS BEEN CONFIRMED, SUGGEST FOLLOW UP TESTING OF ANTITHROMBIN III LEVELS.   Glucose, capillary     Status: Abnormal   Collection Time: 09/08/17  9:19 PM  Result Value Ref Range   Glucose-Capillary 209 (H) 65 - 99 mg/dL   Comment 1 Notify RN   Troponin I     Status: Abnormal   Collection Time: 09/08/17  9:39 PM  Result Value Ref Range   Troponin I 8.61 (HH) <0.03 ng/mL    Comment: CRITICAL VALUE NOTED.  VALUE IS CONSISTENT WITH PREVIOUSLY REPORTED AND CALLED VALUE.  CBC     Status: Abnormal   Collection Time: 09/09/17  3:18 AM  Result Value Ref Range   WBC 8.6 4.0 - 10.5 K/uL   RBC 3.51 (L) 4.22 - 5.81 MIL/uL   Hemoglobin 10.4 (L) 13.0 - 17.0 g/dL   HCT 31.3 (L) 39.0 - 52.0 %   MCV 89.2 78.0 - 100.0 fL   MCH 29.6 26.0 - 34.0 pg   MCHC 33.2 30.0 - 36.0 g/dL   RDW 14.7 11.5 - 15.5 %   Platelets 168 150 - 400 K/uL  Basic metabolic panel     Status: Abnormal   Collection Time: 09/09/17  3:18 AM  Result Value Ref Range   Sodium 135 135 - 145 mmol/L   Potassium 3.9 3.5 -  5.1 mmol/L   Chloride 94 (L) 101 - 111 mmol/L   CO2 28 22 - 32 mmol/L   Glucose, Bld 233 (H) 65 - 99 mg/dL   BUN 50 (H) 6 - 20 mg/dL   Creatinine, Ser 8.18 (H) 0.61 - 1.24 mg/dL   Calcium 8.6 (L) 8.9 - 10.3 mg/dL   GFR calc non Af Amer 6 (L) >60 mL/min   GFR calc Af Amer 7 (L) >60 mL/min    Comment: (NOTE) The eGFR has been calculated using the CKD EPI equation. This calculation has not been validated in all clinical situations. eGFR's persistently <60 mL/min signify possible Chronic Kidney Disease.    Anion gap 13 5 - 15  Heparin level (unfractionated)     Status: Abnormal   Collection Time: 09/09/17  3:18 AM  Result Value Ref Range   Heparin Unfractionated 0.23 (L) 0.30 - 0.70 IU/mL    Comment:        IF HEPARIN RESULTS ARE BELOW EXPECTED VALUES, AND PATIENT DOSAGE HAS BEEN CONFIRMED, SUGGEST FOLLOW UP TESTING OF ANTITHROMBIN III LEVELS.   Protime-INR     Status: None   Collection Time: 09/09/17  3:18 AM  Result Value Ref Range   Prothrombin Time 14.9 11.4 - 15.2 seconds   INR 1.18   Glucose, capillary     Status: Abnormal   Collection Time: 09/09/17  7:54 AM  Result Value Ref Range   Glucose-Capillary 174 (H) 65 - 99 mg/dL  POCT Activated clotting time     Status: None   Collection Time: 09/09/17  1:28 PM  Result Value Ref Range   Activated Clotting Time 136 seconds  Glucose, capillary     Status: Abnormal   Collection Time: 09/09/17  1:57 PM  Result Value Ref Range   Glucose-Capillary 208 (  H) 65 - 99 mg/dL  Glucose, capillary     Status: Abnormal   Collection Time: 09/09/17  4:43 PM  Result Value Ref Range   Glucose-Capillary 217 (H) 65 - 99 mg/dL   Comment 1 Notify RN   CBC     Status: Abnormal   Collection Time: 09/10/17  6:44 AM  Result Value Ref Range   WBC 6.6 4.0 - 10.5 K/uL   RBC 3.40 (L) 4.22 - 5.81 MIL/uL   Hemoglobin 9.9 (L) 13.0 - 17.0 g/dL   HCT 29.5 (L) 39.0 - 52.0 %   MCV 86.8 78.0 - 100.0 fL   MCH 29.1 26.0 - 34.0 pg   MCHC 33.6 30.0 - 36.0  g/dL   RDW 14.3 11.5 - 15.5 %   Platelets 199 150 - 400 K/uL  Basic metabolic panel     Status: Abnormal   Collection Time: 09/10/17  6:44 AM  Result Value Ref Range   Sodium 131 (L) 135 - 145 mmol/L   Potassium 4.6 3.5 - 5.1 mmol/L   Chloride 93 (L) 101 - 111 mmol/L   CO2 24 22 - 32 mmol/L   Glucose, Bld 238 (H) 65 - 99 mg/dL   BUN 68 (H) 6 - 20 mg/dL   Creatinine, Ser 10.19 (H) 0.61 - 1.24 mg/dL   Calcium 8.1 (L) 8.9 - 10.3 mg/dL   GFR calc non Af Amer 5 (L) >60 mL/min   GFR calc Af Amer 5 (L) >60 mL/min    Comment: (NOTE) The eGFR has been calculated using the CKD EPI equation. This calculation has not been validated in all clinical situations. eGFR's persistently <60 mL/min signify possible Chronic Kidney Disease.    Anion gap 14 5 - 15  Heparin level (unfractionated)     Status: None   Collection Time: 09/10/17  6:44 AM  Result Value Ref Range   Heparin Unfractionated 0.34 0.30 - 0.70 IU/mL    Comment:        IF HEPARIN RESULTS ARE BELOW EXPECTED VALUES, AND PATIENT DOSAGE HAS BEEN CONFIRMED, SUGGEST FOLLOW UP TESTING OF ANTITHROMBIN III LEVELS.   Glucose, capillary     Status: Abnormal   Collection Time: 09/10/17  8:02 AM  Result Value Ref Range   Glucose-Capillary 286 (H) 65 - 99 mg/dL  Glucose, capillary     Status: Abnormal   Collection Time: 09/10/17  8:24 AM  Result Value Ref Range   Glucose-Capillary 238 (H) 65 - 99 mg/dL   Comment 1 Capillary Specimen    Comment 2 Notify RN     No results found.  Review of Systems  Constitutional: Positive for malaise/fatigue. Negative for chills and fever.  Respiratory: Positive for shortness of breath.   Cardiovascular: Positive for orthopnea and leg swelling. Negative for chest pain.  Genitourinary:       Renal failure  Musculoskeletal:       Gait disturbance  Neurological: Positive for dizziness.       Problems with balance  All other systems reviewed and are negative.  Blood pressure 107/70, pulse 74,  temperature (!) 97.5 F (36.4 C), temperature source Oral, resp. rate (!) 7, height 5' 9" (1.753 m), weight 213 lb 13.5 oz (97 kg), SpO2 96 %. Physical Exam  Vitals reviewed. Constitutional: He is oriented to person, place, and time. He appears well-developed. No distress.  HENT:  Head: Normocephalic and atraumatic.  Mouth/Throat: No oropharyngeal exudate.  Eyes: Conjunctivae are normal. Scleral icterus (Mild) is present.  Neck:  Neck supple. No thyromegaly present.  No carotid bruit  Cardiovascular: Normal rate and regular rhythm. Exam reveals gallop (S4). Exam reveals no friction rub.  No murmur heard. Respiratory: Effort normal. No respiratory distress. He has no wheezes. He has no rales.  GI: Soft. He exhibits no distension. There is no tenderness.  Musculoskeletal: He exhibits no edema.  Lymphadenopathy:    He has no cervical adenopathy.  Neurological: He is alert and oriented to person, place, and time. No cranial nerve deficit. He exhibits normal muscle tone. Coordination abnormal.  Skin: Skin is warm and dry.   Cardiac catheterization onclusion     Ost LM lesion is 65% stenosed.  Mid LM lesion is 90% stenosed.  Ost Cx to Prox Cx lesion is 100% stenosed.  Ost LAD lesion is 20% stenosed.  Acute Mrg lesion is 60% stenosed.   Significant coronary calcification with high-grade 65% ostial and 90-95% distal left main stenosis, calcification of the proximal LAD with 20-25% narrowing, large ramus intermediate vessel; total occlusion of the left circumflex at the ostium.  Dominant RCA with 60% ostial proximal narrowing in an RV marginal branch and collateralization to the distal circumflex.  LVEDP 14 mm Hg.  RECOMMENDATION: Surgical consultation for CABG revascularization.   I personally reviewed the cath images and concur with the findings noted above.  Assessment/Plan: Ernest [name] is a 71 year old gentleman with multiple significant medical problems including poorly  controlled insulin-dependent type 2 diabetes complicated by nephropathy and retinopathy, end-stage renal disease requiring hemodialysis, chronic systolic and diastolic heart failure, hypertension, hyperlipidemia, and previous CVA.  He do not have full records but according to his wife he has had 5 previous strokes the most recent over a year ago.  She says these have primarily affected his balance and he does walk with a cane.  He is however ambulatory.  He has significant left main disease and moderate left ventricular dysfunction.  He is a high risk candidate for CABG due to his multiple comorbidities.  The STS calculator estimates his predicted mortality at 12%, stroke at 5%, and major morbidity or mortality at 45%.   Had a long discussion with Mr. and Mrs. [name] yesterday evening.  I described the general nature of the operation to them including the incisions to be used, the use of cardiopulmonary bypass, the expected postoperative stay, and the expected overall recovery.  They do understand he is a high risk candidate.  I informed him of the indications, risks, benefits, and alternatives.  They understand the risks include, but are not limited to death, stroke, MI, DVT, PE, bleeding, possible need for transfusion, infection, cardiac arrhythmias, respiratory failure, gastrointestinal complications, as well as possibility of other operative complications.  They wish to think over their options and talk to their family before making a final decision.  He was on Plavix prior to admission.  I stopped that last night.  Friday would be 4 days off Plavix and I think acceptable bleeding risk at that point in time.  If he chooses to proceed with CABG we will plan to do so on Friday, September 13, 2017    Melrose Nakayama 09/10/2017, 10:39 AM

## 2017-09-10 NOTE — Progress Notes (Signed)
[facility] Progress Note  Subjective: heart cath showed severe LM disease and 100% LCX occlusion. For surg consult. LVEDP 14, no SOB or c/o today. On HD  Vitals:   09/10/17 1203 09/10/17 1300 09/10/17 1400 09/10/17 1500  BP:  (!) 76/65 (!) 89/77 (!) 113/50  Pulse:  (!) 104 79   Resp:  (!) 22 (!) 21 13  Temp: (!) 97.5 F (36.4 C)     TempSrc: Oral     SpO2:  (!) 82% 93%   Weight:      Height:        Inpatient medications: . aspirin EC  81 mg Oral Daily  . atorvastatin  80 mg Oral q1800  . calcitRIOL  0.75 mcg Oral Q M,W,F-HD  . cinacalcet  120 mg Oral Q supper  . insulin aspart  0-5 Units Subcutaneous QHS  . insulin aspart  0-9 Units Subcutaneous TID WC  . insulin aspart  3 Units Subcutaneous TID WC  . insulin glargine  12 Units Subcutaneous QHS  . levETIRAcetam  1,000 mg Oral Daily  . metoprolol tartrate  25 mg Oral BID  . multivitamin  1 tablet Oral Daily  . sevelamer carbonate  2,400 mg Oral TID WC  . sodium chloride flush  3 mL Intravenous Q12H   . sodium chloride    . heparin 1,400 Units/hr (09/10/17 1500)  . nitroGLYCERIN Stopped (09/10/17 1059)   sodium chloride, acetaminophen, ondansetron (ZOFRAN) IV, sodium chloride flush  Exam: Alert, lying in bed, nasal O2 No jvd Rales R base, L clear RRR no mrg ABd obese soft ntnd No LE edema LUA AVG +bruit  Dialysis: [facility] MWF 4h 9min  95.5kg   2/2 bath  LUA AVG  Hep none -venofer 50qwk -calc 0.75 w HD      Impression: 1. SOB/ pulm edema - vol excess and/or due to NSTEMI.  Much better, close to dry today and lvedp was good at cath yest.   2. NSTEMI/ CM EF 45-50% - severe disease by cath yest (100% LCX, 90% L main). For surg consult.  On IV heparin.  3. ESRD HD MWF. HD today off schedule, next HD prob Thursday after today.  4. HTN - BP's soft w vol down,  have lowered MTP to 25 bid 5. Anemia of CKD - Hb 11's, no esa  6. MBD of CKD - sensipar 120/d, binder ? Auryxia, calcitriol w HD tiw 7. DM per  primary  Plan - HD today UF 1 L   Kelly Splinter MD [facility] pager [phone]   09/10/2017, 4:15 PM   Recent Labs  Lab 09/08/17 0046 09/09/17 0318 09/10/17 0644  NA 134* 135 131*  K 4.4 3.9 4.6  CL 94* 94* 93*  CO2 22 28 24   GLUCOSE 407* 233* 238*  BUN 71* 50* 68*  CREATININE 10.39* 8.18* 10.19*  CALCIUM 9.4 8.6* 8.1*   Recent Labs  Lab 09/08/17 0046  AST 25  ALT 13*  ALKPHOS 50  BILITOT 0.8  PROT 6.5  ALBUMIN 3.5   Recent Labs  Lab 09/08/17 0046 09/09/17 0318 09/10/17 0644  WBC 11.7* 8.6 6.6  NEUTROABS 9.6*  --   --   HGB 11.6* 10.4* 9.9*  HCT 34.8* 31.3* 29.5*  MCV 89.7 89.2 86.8  PLT 145* 168 199   Iron/TIBC/Ferritin/ %Sat No results found for: IRON, TIBC, FERRITIN, IRONPCTSAT

## 2017-09-11 DIAGNOSIS — R008 Other abnormalities of heart beat: Secondary | ICD-10-CM

## 2017-09-11 DIAGNOSIS — M25511 Pain in right shoulder: Secondary | ICD-10-CM

## 2017-09-11 DIAGNOSIS — I472 Ventricular tachycardia: Secondary | ICD-10-CM

## 2017-09-11 DIAGNOSIS — M25512 Pain in left shoulder: Secondary | ICD-10-CM

## 2017-09-11 DIAGNOSIS — I4729 Other ventricular tachycardia: Secondary | ICD-10-CM

## 2017-09-11 LAB — HEPARIN LEVEL (UNFRACTIONATED): HEPARIN UNFRACTIONATED: 0.52 [IU]/mL (ref 0.30–0.70)

## 2017-09-11 LAB — BASIC METABOLIC PANEL
Anion gap: 14 (ref 5–15)
BUN: 45 mg/dL — ABNORMAL HIGH (ref 6–20)
CHLORIDE: 92 mmol/L — AB (ref 101–111)
CO2: 23 mmol/L (ref 22–32)
CREATININE: 8.24 mg/dL — AB (ref 0.61–1.24)
Calcium: 7.9 mg/dL — ABNORMAL LOW (ref 8.9–10.3)
GFR calc non Af Amer: 6 mL/min — ABNORMAL LOW (ref >60)
GFR, EST AFRICAN AMERICAN: 7 mL/min — AB (ref >60)
GLUCOSE: 277 mg/dL — AB (ref 65–99)
Potassium: 4.2 mmol/L (ref 3.5–5.1)
Sodium: 129 mmol/L — ABNORMAL LOW (ref 135–145)

## 2017-09-11 LAB — GLUCOSE, CAPILLARY
GLUCOSE-CAPILLARY: 265 mg/dL — AB (ref 65–99)
GLUCOSE-CAPILLARY: 159 mg/dL — AB (ref 65–99)
Glucose-Capillary: 107 mg/dL — ABNORMAL HIGH (ref 65–99)
Glucose-Capillary: 274 mg/dL — ABNORMAL HIGH (ref 65–99)

## 2017-09-11 LAB — MAGNESIUM: Magnesium: 2.2 mg/dL (ref 1.7–2.4)

## 2017-09-11 MED ORDER — RENA-VITE PO TABS
1.0000 | ORAL_TABLET | Freq: Every day | ORAL | Status: DC
  Administered 2017-09-12 – 2017-10-03 (×17): 1 via ORAL
  Filled 2017-09-11 (×19): qty 1

## 2017-09-11 MED ORDER — METOPROLOL TARTRATE 5 MG/5ML IV SOLN
5.0000 mg | Freq: Once | INTRAVENOUS | Status: AC
  Administered 2017-09-11: 5 mg via INTRAVENOUS
  Filled 2017-09-11: qty 5

## 2017-09-11 MED ORDER — LEVETIRACETAM 250 MG PO TABS
500.0000 mg | ORAL_TABLET | ORAL | Status: DC
  Administered 2017-09-16: 500 mg via ORAL
  Filled 2017-09-11 (×2): qty 2

## 2017-09-11 MED ORDER — COLCHICINE 0.6 MG PO TABS
0.6000 mg | ORAL_TABLET | Freq: Every day | ORAL | Status: DC
  Administered 2017-09-11 – 2017-09-12 (×2): 0.6 mg via ORAL
  Filled 2017-09-11 (×2): qty 1

## 2017-09-11 MED ORDER — ~~LOC~~ CARDIAC SURGERY, PATIENT & FAMILY EDUCATION
Freq: Once | Status: AC
  Administered 2017-09-11: 21:00:00
  Filled 2017-09-11: qty 1

## 2017-09-11 NOTE — Progress Notes (Signed)
Progress Note  Patient Name: Ernest [name] Date of Encounter: 09/11/2017  Primary Cardiologist: Johnsie Cancel   Subjective   Patient feels well today and endorses improvement in breathing. He still had to sleep propped up but has been weaned off of oxygen to room air. Patient denies chest pain, palpitations, dizziness, cough.   Inpatient Medications    Scheduled Meds: . aspirin EC  81 mg Oral Daily  . atorvastatin  80 mg Oral q1800  . calcitRIOL  0.75 mcg Oral Q M,W,F-HD  . cinacalcet  120 mg Oral Q supper  . insulin aspart  0-5 Units Subcutaneous QHS  . insulin aspart  0-9 Units Subcutaneous TID WC  . insulin aspart  3 Units Subcutaneous TID WC  . insulin glargine  12 Units Subcutaneous QHS  . levETIRAcetam  1,000 mg Oral Daily  . metoprolol tartrate  25 mg Oral BID  . multivitamin  1 tablet Oral Daily  . sevelamer carbonate  2,400 mg Oral TID WC  . sodium chloride flush  3 mL Intravenous Q12H   Continuous Infusions: . sodium chloride    . heparin 1,400 Units/hr (09/11/17 0600)  . nitroGLYCERIN Stopped (09/10/17 1059)   PRN Meds: sodium chloride, acetaminophen, ondansetron (ZOFRAN) IV, sodium chloride flush   Vital Signs    Vitals:   09/11/17 0700 09/11/17 0800 09/11/17 0803 09/11/17 0900  BP: (!) 130/98 113/71  119/70  Pulse: 72 93  97  Resp: 20 16  (!) 24  Temp:   97.9 F (36.6 C)   TempSrc:   Oral   SpO2: 98% 91%  91%  Weight:      Height:        Intake/Output Summary (Last 24 hours) at 09/11/2017 0932 Last data filed at 09/11/2017 0600 Gross per 24 hour  Intake 559.5 ml  Output 550 ml  Net 9.5 ml   Filed Weights   09/10/17 1530 09/10/17 1852 09/11/17 0600  Weight: 214 lb 15.2 oz (97.5 kg) 212 lb 11.9 oz (96.5 kg) 210 lb 12.2 oz (95.6 kg)    Telemetry    SR, more persistent NSVT runs - Personally Reviewed, 09/11/17  ECG    N/a today  Physical Exam   General: NAD, sitting up in bed Head: Normocephalic, atraumatic.  Neck: no JVD. Lungs:  CTAB on anterior lung fields, no increased work of breathing. Heart: RRR, no murmurs, rubs or gallops.  Abdomen: soft, NDNT, +BS.  Extremities: No clubbing, cyanosis, edema. Radial and PT pulses intact. Neuro: Alert and oriented X 3. Moves all extremities spontaneously. Psych: Normal affect.  Labs    Chemistry Recent Labs  Lab 09/08/17 0046 09/09/17 0318 09/10/17 0644 09/10/17 1558  NA 134* 135 131* 132*  K 4.4 3.9 4.6 4.3  CL 94* 94* 93* 92*  CO2 22 28 24 24   GLUCOSE 407* 233* 238* 235*  BUN 71* 50* 68* 72*  CREATININE 10.39* 8.18* 10.19* 10.61*  CALCIUM 9.4 8.6* 8.1* 8.0*  PROT 6.5  --   --   --   ALBUMIN 3.5  --   --  2.7*  AST 25  --   --   --   ALT 13*  --   --   --   ALKPHOS 50  --   --   --   BILITOT 0.8  --   --   --   GFRNONAA 4* 6* 5* 4*  GFRAA 5* 7* 5* 5*  ANIONGAP 18* 13 14 16*     Hematology  Recent Labs  Lab 09/08/17 0046 09/09/17 0318 09/10/17 0644  WBC 11.7* 8.6 6.6  RBC 3.88* 3.51* 3.40*  HGB 11.6* 10.4* 9.9*  HCT 34.8* 31.3* 29.5*  MCV 89.7 89.2 86.8  MCH 29.9 29.6 29.1  MCHC 33.3 33.2 33.6  RDW 14.7 14.7 14.3  PLT 145* 168 199    Cardiac Enzymes Recent Labs  Lab 09/08/17 0939 09/08/17 1221 09/08/17 2139  TROPONINI 8.33* 10.11* 8.61*    Recent Labs  Lab 09/08/17 0049  TROPIPOC 6.48*     BNP Recent Labs  Lab 09/08/17 0046  BNP 4,125.9*     DDimer No results for input(s): DDIMER in the last 168 hours.    Radiology    Dg Chest Port 1 View  Result Date: 09/10/2017 CLINICAL DATA:  Follow up CHF. Pre-op for CABG on 11/30. Pt unable to give hx due to condition at this time. EXAM: PORTABLE CHEST 1 VIEW COMPARISON:  09/08/2017 FINDINGS: Mildly degraded exam due to AP portable technique and patient body habitus. Patient rotated to the right. Midline trachea. Cardiomegaly accentuated by AP portable technique. Probable small left pleural effusion. Presumed calcified node projecting over the left side of the thoracic inlet. No  pneumothorax. Improved mild interstitial edema. Right minor fissure thickening. Worsened left base aeration. IMPRESSION: Improved mild congestive heart failure. Small left pleural effusion with worsened adjacent airspace disease, most likely atelectasis. Electronically Signed   By: Abigail Miyamoto M.D.   On: 09/10/2017 15:34    Cardiac Studies   TTE: 09/08/17  Study Conclusions  - Left ventricle: Inferobasal and posterior lateral hypokinesis EF   similar to echo from 2016. The cavity size was normal. Wall   thickness was normal. Systolic function was mildly reduced. The   estimated ejection fraction was in the range of 45% to 50%. - Mitral valve: Calcified annulus. Mildly thickened leaflets .   There was mild regurgitation. - Left atrium: The atrium was moderately dilated. - Pulmonary arteries: PA peak pressure: 50 mm Hg (S).  Cath: 11/13  Cath 08/26/2012 Left mainstem: There is a 50% eccentric ostial left main stenosis.  Left anterior descending (LAD): The LAD is heavily calcified throughout the proximal vessel. There is 50-70% stenosis in the proximal vessel followed by segment of 40% disease in the proximal to mid vessel.  The ramus intermediate branch is moderate in size and has mild disease at the ostium up to 30%.  Left circumflex (LCx): The left circumflex has minor irregularities up to 20%.  Right coronary artery (RCA): The right coronary has a 50% stenosis in the proximal vessel. The remainder of the vessel is without significant disease.  Left ventriculography: There is apical hypokinesis with mild left ventricular dysfunction. Ejection fraction is estimated at 50%.  Final Conclusions:  1. There is modest disease involving the ostial left main, proximal LAD and proximal right coronary. These lesions do not appear to be hemodynamically significant. 2. Mild left ventricular dysfunction with apical hypokinesis.  Recommendations: Cardiac catheterization findings do  not correlate well with his Myoview study. His ejection fraction looks better angiographically than 40%. Patient has been asymptomatic. I would recommend medical management.  LHC 09/09/2017:  Ost LM lesion is 65% stenosed.  Mid LM lesion is 90% stenosed.  Ost Cx to Prox Cx lesion is 100% stenosed.  Ost LAD lesion is 20% stenosed.  Acute Mrg lesion is 60% stenosed. Significant coronary calcification with high-grade 65% ostial and 90-95% distal left main stenosis, calcification of the proximal LAD with 20-25% narrowing,  large ramus intermediate vessel; total occlusion of the left circumflex at the ostium.  Dominant RCA with 60% ostial proximal narrowing in an RV marginal branch and collateralization to the distal circumflex. Recommend surgical consult for CABG.  Patient Profile     71 y.o. male with a hx of CAD, chronic systolic HF, COPD, ESRD on HD MWF, HTN, HLD, DM II and h/o CVA who presented with acute on chronic systolic HF and found to have an elevated troponin.   Assessment & Plan    NSTEMI CAD:  Patient with multivessel CAD and 90-95% left main stenosis - recommendation is for CABG. He has been scheduled for surgery on 11/30; holding plavix until post-op. --asa 81mg  daily; holding plavix --atorvastatin 80mg  daily --metoprolol 25mg  BID --heparin gtt --CTS on board for CABG - 11/30  NSVT, PVCS:  More persistent NSVT, and PVCs on tele since yesterday. He remains without symptoms. Addressing underlying cardiac disease as above.  Acute on chronic systolic HF:  EF stable on echo this admission. Nephrology following for HD. He appears euvolemic on exam today.  ERSD on HD MWF:  Nephrology on board for HD.  DM: Management per primary  Signed, Alphonzo Grieve, MD  09/11/2017, 9:32 AM   I have examined the patient and reviewed assessment and plan and discussed with patient.  Agree with above as stated.  Severe CAD.  Awaiting CABG on Friday.  He has had some NSVT on tele but  remains hemodynamically stable. Continue beta blocker as BP allows.  Dialysis for ESRD.   Larae Grooms   For questions or updates, please contact [facility] Please consult [email or web address] for contact info under Cardiology/STEMI.

## 2017-09-11 NOTE — Progress Notes (Signed)
   Subjective: Mr. Forstrom was seen laying in his bed eating breakfast this morning. He states that he is doing well and has not had any chest pain or shortness of breath. He does however state that he has bilateral shoulder pain that worsens when he moves his arm.   Patient has had a few runs of ventricular tachycardia noted on telemetry.   Objective:  Vital signs in last 24 hours: Vitals:   09/11/17 0800 09/11/17 0803 09/11/17 0900 09/11/17 1000  BP: 113/71  119/70 96/76  Pulse: 93  97 (!) 111  Resp: 16  (!) 24 16  Temp:  97.9 F (36.6 C)    TempSrc:  Oral    SpO2: 91%  91% (!) 89%  Weight:      Height:       Physical Exam  Constitutional: He appears well-developed and well-nourished. No distress.  HENT:  Head: Normocephalic and atraumatic.  Eyes: Conjunctivae are normal.  Cardiovascular: Normal rate, regular rhythm and normal heart sounds.  Respiratory: Effort normal and breath sounds normal. No respiratory distress. He has no wheezes.  GI: Soft. Bowel sounds are normal. He exhibits no distension. There is no tenderness.  Musculoskeletal: Normal range of motion. He exhibits tenderness (in bilateral shoulder that patient states is prevalent with movement).  Neurological: He is alert.  Skin: He is not diaphoretic. No erythema.  Psychiatric: He has a normal mood and affect. His behavior is normal. Judgment and thought content normal.   Assessment/Plan:  NSTEMI with left main stenosis 90-95%  Patient expressed this morning that he and his wife are aggreeable to have CABG done.  Carotid Doppler was done yesterday screening for CABG.  Carotid Doppler 09/03/2016: Right ICA stenosis 1-39%, left ICA stenosis 1-39%, vertebral artery  patent with anterior grade flow.  Right upper extremity obliterates with radial compression however normal ulnar compression.  Left upper extremity normal with both radial and ulnar compression.  -Planned CABG on 09/13/2017 -Continueheparin  drip -Aspirin 81mg  given -Continue atorvastatin 80 mg daily -Hemodialysis for chf- hemodialysis was done 09/10/2017 to try weight -Heart healthy diet  End-stage renal disease  Next hemodialysis scheduled for 09/12/2017 -Continue Calcitrol 0.75 MCG Monday Wednesday Friday -Continue Cinacalcetat 120 mg daily -Continue sevelamer 2400 mg 3 times daily -Continue multivitamin  Diabetesmellitus type II  The patient's Lantus regimen was adjusted yesterday to 12 units nightly.  We will monitor the patient's blood glucose today 05/11/2017.  -3 units aspart with meals -SSI-changed from sensitive to moderate  Essential hypertension  Patient's blood pressure over the past 24 hours has ranged 70-163/62-108 -Continue metoprolol 50 mg twice daily  Coronary artery disease -Continue clopidogrel 75 mg daily -Aspirin 325mg  given -Continue atorvastatin 40 mg nightly  Seizure -Continue Keppra 500mg  daily on Monday Wednesday Friday  DVT OZD:GUYQIHK 1400u, at therapeutic level  Dispo: Anticipated discharge in approximately 2-3 days.   Lars Mage, MD Internal Medicine PGY1 Pager:[phone] 09/11/2017, 11:06 AM

## 2017-09-11 NOTE — Progress Notes (Signed)
[id number] Did not walk due to LM disease. Pt stated he has had 5 strokes and has some balance issues. Also stated he feels weak after dialysis and sleeps a lot. He may benefit from PT consult after surgery. Gave him OHS booklet, care guide and wrote down how to view pre op video. Discussed sternal precautions and demonstrated for pt how to get up and down adhering to sternal precautions. Pt stated his wife will be available 24/7 after discharge to assist in his care. Discussed the importance of IS and walking after surgery. Gave pt IS and he demonstrated 1250 ml correctly. Encouraged him to practice. We will follow up after surgery. Graylon Good RN BSN 09/11/2017 12:08 PM

## 2017-09-11 NOTE — Progress Notes (Signed)
[facility] Progress Note  Subjective: no issues  Vitals:   09/11/17 0700 09/11/17 0800 09/11/17 0803 09/11/17 0900  BP: (!) 130/98 113/71  119/70  Pulse: 72 93  97  Resp: 20 16  (!) 24  Temp:   97.9 F (36.6 C)   TempSrc:   Oral   SpO2: 98% 91%  91%  Weight:      Height:        Inpatient medications: . aspirin EC  81 mg Oral Daily  . atorvastatin  80 mg Oral q1800  . calcitRIOL  0.75 mcg Oral Q M,W,F-HD  . cinacalcet  120 mg Oral Q supper  . insulin aspart  0-5 Units Subcutaneous QHS  . insulin aspart  0-9 Units Subcutaneous TID WC  . insulin aspart  3 Units Subcutaneous TID WC  . insulin glargine  12 Units Subcutaneous QHS  . levETIRAcetam  1,000 mg Oral Daily  . metoprolol tartrate  25 mg Oral BID  . multivitamin  1 tablet Oral Daily  . sevelamer carbonate  2,400 mg Oral TID WC  . sodium chloride flush  3 mL Intravenous Q12H   . sodium chloride    . heparin 1,400 Units/hr (09/11/17 0900)  . nitroGLYCERIN Stopped (09/10/17 1059)   sodium chloride, acetaminophen, ondansetron (ZOFRAN) IV, sodium chloride flush  Exam: Alert, lying in bed, nasal O2 No jvd Rales R base, L clear RRR no mrg ABd obese soft ntnd No LE edema LUA AVG +bruit  Dialysis: [facility] MWF 4h 58min  95.5kg   2/2 bath  LUA AVG  Hep none -venofer 50qwk -calc 0.75 w HD -home BP meds > metoprolol 25 bid      Impression: 1. SOB/ pulm edema - resolved by CXR, due to MI not vol overload.  2. NSTEMI/ CM EF 45-50% - severe disease by cath (100% LCX, 90% L main). Possibly for surg on Friday. 3. ESRD HD MWF. HD tomorrow off schedule.  4. HTN - bp's good, on MTP 25 bid (home dose) 5. Anemia of CKD - Hb 11's, no esa  6. MBD of CKD - sensipar 120/d, binder ? Auryxia, calcitriol w HD tiw 7. DM w mult complications 8. Hx CVA (x 5 approx)  Plan - HD tomorrow off sched, keep even   Kelly Splinter MD [facility] pager [phone]   09/11/2017, 10:21 AM   Recent Labs  Lab  09/09/17 0318 09/10/17 0644 09/10/17 1558  NA 135 131* 132*  K 3.9 4.6 4.3  CL 94* 93* 92*  CO2 28 24 24   GLUCOSE 233* 238* 235*  BUN 50* 68* 72*  CREATININE 8.18* 10.19* 10.61*  CALCIUM 8.6* 8.1* 8.0*  PHOS  --   --  6.4*   Recent Labs  Lab 09/08/17 0046 09/10/17 1558  AST 25  --   ALT 13*  --   ALKPHOS 50  --   BILITOT 0.8  --   PROT 6.5  --   ALBUMIN 3.5 2.7*   Recent Labs  Lab 09/08/17 0046 09/09/17 0318 09/10/17 0644  WBC 11.7* 8.6 6.6  NEUTROABS 9.6*  --   --   HGB 11.6* 10.4* 9.9*  HCT 34.8* 31.3* 29.5*  MCV 89.7 89.2 86.8  PLT 145* 168 199   Iron/TIBC/Ferritin/ %Sat No results found for: IRON, TIBC, FERRITIN, IRONPCTSAT

## 2017-09-11 NOTE — Progress Notes (Signed)
ANTICOAGULATION CONSULT NOTE - Follow Up Consult  Pharmacy Consult for heparin Indication: NSTEMI  Labs: Recent Labs    09/08/17 1221  09/08/17 2139 09/09/17 0318 09/10/17 0644 09/10/17 1558 09/11/17 0602  HGB  --   --   --  10.4* 9.9*  --   --   HCT  --   --   --  31.3* 29.5*  --   --   PLT  --   --   --  168 199  --   --   LABPROT  --   --   --  14.9  --   --   --   INR  --   --   --  1.18  --   --   --   HEPARINUNFRC  --    < >  --  0.23* 0.34  --  0.52  CREATININE  --   --   --  8.18* 10.19* 10.61*  --   TROPONINI 10.11*  --  8.61*  --   --   --   --    < > = values in this interval not displayed.    Assessment: 71yo male on heparin for ACS. Cath report showed significant calcification (65% Ost LM, 90-95% mid LM, 100% Ost Cx to Prox Cx, and 60% at acute Mrg), recommending surgical consultation for CABG. Heparin restarted 8 hours after sheath removal (10/26 at 1415).  Hgb down from 11.6 to 9.9 post-surgery. Platelets remain stable. No signs/symptoms of bleeding documented.  Heparin level therapeutic at 0.52 on heparin 1400 units/hr.  Goal of Therapy:  Heparin level 0.3-0.7 units/ml   Plan:  Continue heparin at 1400 units/hr Monitor daily HL, CBC, and s/sx of bleeding Follow along for plan by CTVS for surgery   Angus Seller, PharmD Pharmacy Resident [phone] 09/11/2017 9:43 AM

## 2017-09-11 NOTE — Procedures (Signed)
Central Venous Catheter Insertion Procedure Note  Line type: Internal Jugular Vein Catheter  Indications:  Inadequate venous access  Procedure Details:  Informed consent was obtained after explanation of the risks and benefits of the procedure, refer to the consent documentation.  Time-out was performed immediately prior to the procedure.  The left  Internal jugular vein was identified using bedside ultrasound. This area was prepped and draped in the usual sterile fashion. Maximum sterile technique was used including antiseptics, cap, gloves, gown, hand hygiene, mask, and sterile sheet.  The patient was placed in Trendelenburgs position. Local anesthesia with 1% lidocaine was applied subcutaneously then deep to the skin. The needle was then inserted into the internal jugular vein using ultrasound guidance.  Using the Seldinger Technique a needle was placed with each port easily flushed and freely drawing venous blood. Two attempts were made to pass the wire, but it was met with resistance and therefore the procedure was stopped and more attempts were not made.   Condition: The patient tolerated the procedure well and remains in the same condition as pre-procedure.  Complications: Small 1.2QU hematoma present at site of procedure  Plan:  -Monitor patient's small 1.5cm hematoma. If it enlarges will apply pressure and consider stopping heparin.

## 2017-09-11 NOTE — Progress Notes (Signed)
2 Days Post-Op Procedure(s) (LRB): LEFT HEART CATH AND CORONARY ANGIOGRAPHY (N/A) Subjective: C/o left shoulder pain No chest pain or shortness of breath.  Pain started this AM. No antecedent trauma.   Objective: Vital signs in last 24 hours: Temp:  [97.4 F (36.3 C)-97.9 F (36.6 C)] 97.7 F (36.5 C) (11/28 1656) Pulse Rate:  [43-112] 92 (11/28 1700) Cardiac Rhythm: Normal sinus rhythm (11/28 1600) Resp:  [12-24] 17 (11/28 1700) BP: (73-145)/(34-98) 121/58 (11/28 1700) SpO2:  [86 %-100 %] 98 % (11/28 1700) Weight:  [210 lb 12.2 oz (95.6 kg)-212 lb 11.9 oz (96.5 kg)] 210 lb 12.2 oz (95.6 kg) (11/28 0600)  Hemodynamic parameters for last 24 hours:    Intake/Output from previous day: 11/27 0701 - 11/28 0700 In: 604.5 [P.O.:250; I.V.:354.5] Out: 550  Intake/Output this shift: Total I/O In: 140 [I.V.:140] Out: -   General appearance: alert, cooperative and no distress Neurologic: no focal weakness Heart: regular rate and rhythm Lungs: clear to auscultation bilaterally Extremities: tender to palpation left shoulder  Lab Results: Recent Labs    09/09/17 0318 09/10/17 0644  WBC 8.6 6.6  HGB 10.4* 9.9*  HCT 31.3* 29.5*  PLT 168 199   BMET:  Recent Labs    09/10/17 0644 09/10/17 1558  NA 131* 132*  K 4.6 4.3  CL 93* 92*  CO2 24 24  GLUCOSE 238* 235*  BUN 68* 72*  CREATININE 10.19* 10.61*  CALCIUM 8.1* 8.0*    PT/INR:  Recent Labs    09/09/17 0318  LABPROT 14.9  INR 1.18   ABG    Component Value Date/Time   PHART 7.359 06/08/2013 0531   HCO3 27.3 (H) 06/08/2013 0531   TCO2 34 10/27/2016 1626   O2SAT 98.0 06/08/2013 0531   CBG (last 3)  Recent Labs    09/11/17 0802 09/11/17 1129 09/11/17 1653  GLUCAP 107* 265* 159*    Assessment/Plan: S/P Procedure(s) (LRB): LEFT HEART CATH AND CORONARY ANGIOGRAPHY (N/A) -severe left main disease s/p NSTEMI  For high risk CABG on Friday plavix stopped 11/26 Left shoulder pain concerning. It is different  from the pain he presented with and he has point tenderness. Not c/w angina. He does have a history of gout. Will give colchicine to see if that helps Needs labs drawn on HD tomorrow   LOS: 3 days    Melrose Nakayama 09/11/2017

## 2017-09-12 ENCOUNTER — Inpatient Hospital Stay (HOSPITAL_COMMUNITY): Payer: Medicare Other

## 2017-09-12 DIAGNOSIS — Z95818 Presence of other cardiac implants and grafts: Secondary | ICD-10-CM

## 2017-09-12 DIAGNOSIS — R410 Disorientation, unspecified: Secondary | ICD-10-CM

## 2017-09-12 LAB — HEPARIN LEVEL (UNFRACTIONATED): Heparin Unfractionated: 0.54 IU/mL (ref 0.30–0.70)

## 2017-09-12 LAB — GLUCOSE, CAPILLARY
GLUCOSE-CAPILLARY: 167 mg/dL — AB (ref 65–99)
GLUCOSE-CAPILLARY: 142 mg/dL — AB (ref 65–99)
Glucose-Capillary: 307 mg/dL — ABNORMAL HIGH (ref 65–99)
Glucose-Capillary: 198 mg/dL — ABNORMAL HIGH (ref 65–99)

## 2017-09-12 LAB — RENAL FUNCTION PANEL
ANION GAP: 16 — AB (ref 5–15)
Albumin: 3 g/dL — ABNORMAL LOW (ref 3.5–5.0)
BUN: 50 mg/dL — ABNORMAL HIGH (ref 6–20)
CALCIUM: 8.3 mg/dL — AB (ref 8.9–10.3)
CO2: 21 mmol/L — ABNORMAL LOW (ref 22–32)
Chloride: 93 mmol/L — ABNORMAL LOW (ref 101–111)
Creatinine, Ser: 9.15 mg/dL — ABNORMAL HIGH (ref 0.61–1.24)
GFR calc non Af Amer: 5 mL/min — ABNORMAL LOW (ref >60)
GFR, EST AFRICAN AMERICAN: 6 mL/min — AB (ref >60)
GLUCOSE: 267 mg/dL — AB (ref 65–99)
PHOSPHORUS: 5.5 mg/dL — AB (ref 2.5–4.6)
Potassium: 4.6 mmol/L (ref 3.5–5.1)
SODIUM: 130 mmol/L — AB (ref 135–145)

## 2017-09-12 LAB — CBC
HEMATOCRIT: 30.3 % — AB (ref 39.0–52.0)
HEMOGLOBIN: 10.3 g/dL — AB (ref 13.0–17.0)
MCH: 29.3 pg (ref 26.0–34.0)
MCHC: 34 g/dL (ref 30.0–36.0)
MCV: 86.3 fL (ref 78.0–100.0)
Platelets: 254 10*3/uL (ref 150–400)
RBC: 3.51 MIL/uL — AB (ref 4.22–5.81)
RDW: 14.1 % (ref 11.5–15.5)
WBC: 12.7 10*3/uL — AB (ref 4.0–10.5)

## 2017-09-12 LAB — ABO/RH: ABO/RH(D): B POS

## 2017-09-12 LAB — SURGICAL PCR SCREEN
MRSA, PCR: NEGATIVE
Staphylococcus aureus: POSITIVE — AB

## 2017-09-12 MED ORDER — DEXTROSE 5 % IV SOLN
1.5000 g | INTRAVENOUS | Status: AC
  Administered 2017-09-13: 1.5 g via INTRAVENOUS
  Administered 2017-09-13: .75 g via INTRAVENOUS
  Filled 2017-09-12: qty 1.5

## 2017-09-12 MED ORDER — EPINEPHRINE PF 1 MG/ML IJ SOLN
0.0000 ug/min | INTRAMUSCULAR | Status: DC
  Filled 2017-09-12: qty 4

## 2017-09-12 MED ORDER — PHENYLEPHRINE HCL 10 MG/ML IJ SOLN
30.0000 ug/min | INTRAMUSCULAR | Status: AC
  Administered 2017-09-13: 50 ug/min via INTRAVENOUS
  Administered 2017-09-13: 70 ug/min via INTRAVENOUS
  Filled 2017-09-12: qty 2

## 2017-09-12 MED ORDER — INSULIN ASPART 100 UNIT/ML ~~LOC~~ SOLN
4.0000 [IU] | Freq: Three times a day (TID) | SUBCUTANEOUS | Status: DC
  Administered 2017-09-12 (×2): 4 [IU] via SUBCUTANEOUS

## 2017-09-12 MED ORDER — DIAZEPAM 2 MG PO TABS
2.0000 mg | ORAL_TABLET | Freq: Once | ORAL | Status: AC
  Administered 2017-09-13: 2 mg via ORAL
  Filled 2017-09-12: qty 1

## 2017-09-12 MED ORDER — INSULIN ASPART 100 UNIT/ML ~~LOC~~ SOLN
0.0000 [IU] | SUBCUTANEOUS | Status: DC
  Administered 2017-09-12: 3 [IU] via SUBCUTANEOUS
  Administered 2017-09-12: 2 [IU] via SUBCUTANEOUS
  Administered 2017-09-13 (×2): 3 [IU] via SUBCUTANEOUS

## 2017-09-12 MED ORDER — SODIUM CHLORIDE 0.9 % IV SOLN: 100.0000 mL | INTRAVENOUS | Status: DC | PRN

## 2017-09-12 MED ORDER — TRANEXAMIC ACID (OHS) BOLUS VIA INFUSION
15.0000 mg/kg | INTRAVENOUS | Status: AC
  Administered 2017-09-13: 1458 mg via INTRAVENOUS
  Filled 2017-09-12: qty 1458

## 2017-09-12 MED ORDER — CHLORHEXIDINE GLUCONATE CLOTH 2 % EX PADS
6.0000 | MEDICATED_PAD | Freq: Once | CUTANEOUS | Status: AC
  Administered 2017-09-12: 6 via TOPICAL

## 2017-09-12 MED ORDER — ALBUMIN HUMAN 25 % IV SOLN
INTRAVENOUS | Status: AC
  Filled 2017-09-12: qty 100

## 2017-09-12 MED ORDER — BISACODYL 5 MG PO TBEC: 5.0000 mg | DELAYED_RELEASE_TABLET | Freq: Once | ORAL | Status: DC

## 2017-09-12 MED ORDER — CHLORHEXIDINE GLUCONATE CLOTH 2 % EX PADS: 6.0000 | MEDICATED_PAD | Freq: Every day | CUTANEOUS | Status: DC

## 2017-09-12 MED ORDER — VANCOMYCIN HCL 10 G IV SOLR
1500.0000 mg | INTRAVENOUS | Status: AC
  Administered 2017-09-13: 1500 mg via INTRAVENOUS
  Filled 2017-09-12: qty 1500

## 2017-09-12 MED ORDER — PLASMA-LYTE 148 IV SOLN
INTRAVENOUS | Status: AC
  Administered 2017-09-13: 400 mL
  Filled 2017-09-12: qty 2.5

## 2017-09-12 MED ORDER — SODIUM CHLORIDE 0.9 % IV SOLN
INTRAVENOUS | Status: DC
  Filled 2017-09-12: qty 30

## 2017-09-12 MED ORDER — LIDOCAINE-PRILOCAINE 2.5-2.5 % EX CREA: 1.0000 "application " | TOPICAL_CREAM | CUTANEOUS | Status: DC | PRN

## 2017-09-12 MED ORDER — DEXTROSE 5 % IV SOLN
750.0000 mg | INTRAVENOUS | Status: DC
  Filled 2017-09-12: qty 750

## 2017-09-12 MED ORDER — CHLORHEXIDINE GLUCONATE CLOTH 2 % EX PADS
6.0000 | MEDICATED_PAD | Freq: Once | CUTANEOUS | Status: AC
  Administered 2017-09-13: 6 via TOPICAL

## 2017-09-12 MED ORDER — TEMAZEPAM 15 MG PO CAPS: 15.0000 mg | ORAL_CAPSULE | Freq: Once | ORAL | Status: DC | PRN

## 2017-09-12 MED ORDER — HEPARIN SODIUM (PORCINE) 1000 UNIT/ML DIALYSIS
1000.0000 [IU] | INTRAMUSCULAR | Status: DC | PRN
  Administered 2017-09-13: 2000 [IU] via INTRAVENOUS_CENTRAL
  Administered 2017-09-13: 27000 [IU] via INTRAVENOUS_CENTRAL

## 2017-09-12 MED ORDER — DOPAMINE-DEXTROSE 3.2-5 MG/ML-% IV SOLN
0.0000 ug/kg/min | INTRAVENOUS | Status: AC
Start: 2017-09-13 — End: 2017-09-13
  Administered 2017-09-13: 3 ug/kg/min via INTRAVENOUS
  Filled 2017-09-12: qty 250

## 2017-09-12 MED ORDER — CHLORHEXIDINE GLUCONATE 0.12 % MT SOLN
15.0000 mL | Freq: Once | OROMUCOSAL | Status: AC
  Administered 2017-09-13: 15 mL via OROMUCOSAL
  Filled 2017-09-12: qty 15

## 2017-09-12 MED ORDER — SODIUM CHLORIDE 0.9 % IV SOLN
INTRAVENOUS | Status: AC
  Administered 2017-09-13: 1.9 [IU]/h via INTRAVENOUS
  Filled 2017-09-12: qty 1

## 2017-09-12 MED ORDER — POTASSIUM CHLORIDE 2 MEQ/ML IV SOLN
80.0000 meq | INTRAVENOUS | Status: DC
  Filled 2017-09-12: qty 40

## 2017-09-12 MED ORDER — TRANEXAMIC ACID (OHS) PUMP PRIME SOLUTION
2.0000 mg/kg | INTRAVENOUS | Status: DC
  Filled 2017-09-12: qty 1.94

## 2017-09-12 MED ORDER — MAGNESIUM SULFATE 50 % IJ SOLN
40.0000 meq | INTRAMUSCULAR | Status: DC
  Filled 2017-09-12: qty 9.85

## 2017-09-12 MED ORDER — TRANEXAMIC ACID 1000 MG/10ML IV SOLN
1.5000 mg/kg/h | INTRAVENOUS | Status: AC
  Administered 2017-09-13: 1.5 mg/kg/h via INTRAVENOUS
  Filled 2017-09-12: qty 25

## 2017-09-12 MED ORDER — METOPROLOL TARTRATE 12.5 MG HALF TABLET
12.5000 mg | ORAL_TABLET | Freq: Once | ORAL | Status: AC
  Administered 2017-09-13: 12.5 mg via ORAL
  Filled 2017-09-12: qty 1

## 2017-09-12 MED ORDER — MILRINONE LACTATE IN DEXTROSE 20-5 MG/100ML-% IV SOLN
0.1250 ug/kg/min | INTRAVENOUS | Status: AC
  Administered 2017-09-13: 0.375 ug/kg/min via INTRAVENOUS
  Filled 2017-09-12: qty 100

## 2017-09-12 MED ORDER — PENTAFLUOROPROP-TETRAFLUOROETH EX AERO: 1.0000 "application " | INHALATION_SPRAY | CUTANEOUS | Status: DC | PRN

## 2017-09-12 MED ORDER — LIDOCAINE HCL (PF) 1 % IJ SOLN: 5.0000 mL | INTRAMUSCULAR | Status: DC | PRN

## 2017-09-12 MED ORDER — DEXMEDETOMIDINE HCL IN NACL 400 MCG/100ML IV SOLN
0.1000 ug/kg/h | INTRAVENOUS | Status: AC
  Administered 2017-09-13: .3 ug/kg/h via INTRAVENOUS
  Filled 2017-09-12: qty 100

## 2017-09-12 MED ORDER — MUPIROCIN 2 % EX OINT
1.0000 "application " | TOPICAL_OINTMENT | Freq: Two times a day (BID) | CUTANEOUS | Status: AC
  Administered 2017-09-12 – 2017-09-17 (×9): 1 via NASAL
  Filled 2017-09-12: qty 22

## 2017-09-12 MED ORDER — NITROGLYCERIN IN D5W 200-5 MCG/ML-% IV SOLN
2.0000 ug/min | INTRAVENOUS | Status: DC
  Filled 2017-09-12: qty 250

## 2017-09-12 MED ORDER — ALTEPLASE 2 MG IJ SOLR: 2.0000 mg | Freq: Once | INTRAMUSCULAR | Status: DC | PRN

## 2017-09-12 NOTE — Progress Notes (Signed)
Internal Medicine Attending:   I saw and examined the patient. I reviewed the resident's note and I agree with the resident's findings and plan as documented in the resident's note. Seen on morning rounds.  He was alert and oriented had apparently complained of shortness of breath earlier this morning.  He is about to receive hemodialysis.  Additionally he did note that he has developed left shoulder pain he reports this is consistent with his previous gout attacks.  He was started on colchicine I believe by nephrology for this.  Of Note he is afebrile, he does have a very mild leukocytosis.  On examination of his shoulders they are both cool to touch, he does have pain with active and passive motion of his left shoulder and some tenderness to palpation.  Otherwise his exam reveals lungs bilateral basilar crackles he has pitting edema of the lower extremities. Is a plan for high risk CABG tomorrow he is currently on heparin occasionally still having some anginal chest pains.  His left shoulder pain most likely is due to gout and was given a dose of colchicine we will monitor this as well as monitor for fevers and trend white blood cell count.  He has no other reason to suspect septic joint think is reasonable to monitor now versus stopping anticoagulation to do an arthrocentesis.

## 2017-09-12 NOTE — Plan of Care (Signed)
Had respiratory issues today, labored breathing, 2L fluid off from dialysis.  Improved work of breathing. Had periods of confusion. Wife at bedside discussed CABG In Am, Booklets read by wife. Questions answered.

## 2017-09-12 NOTE — Progress Notes (Signed)
Trying to get out of bed, does not know where he is , At [place]" knows self, states date is 2013.  Paged IM regarding confusion in crease. When they arrived, patient was alert and oriented. Having frequent PVCS. IM aware of PVCs as well.  Wife at bedside, updated on condition, Consented for procedure in the AM

## 2017-09-12 NOTE — Progress Notes (Signed)
[facility] Progress Note  Subjective: pt is dyspneic this am, asking to sit up  Vitals:   09/12/17 0200 09/12/17 0300 09/12/17 0400 09/12/17 0806  BP: (!) 129/57 (!) 125/104 (!) 107/41 (!) 110/46  Pulse: (!) 102 99 94 (!) 104  Resp: 19 15 (!) 21 (!) 31  Temp:  (!) 97.5 F (36.4 C)  97.9 F (36.6 C)  TempSrc:  Oral  Oral  SpO2: 100% 100% 96% 93%  Weight:      Height:        Inpatient medications: . aspirin EC  81 mg Oral Daily  . atorvastatin  80 mg Oral q1800  . calcitRIOL  0.75 mcg Oral Q M,W,F-HD  . cinacalcet  120 mg Oral Q supper  . colchicine  0.6 mg Oral Daily  . insulin aspart  0-5 Units Subcutaneous QHS  . insulin aspart  0-9 Units Subcutaneous TID WC  . insulin aspart  3 Units Subcutaneous TID WC  . insulin glargine  12 Units Subcutaneous QHS  . levETIRAcetam  1,000 mg Oral Daily  . levETIRAcetam  500 mg Oral Q M,W,F-HD  . metoprolol tartrate  25 mg Oral BID  . multivitamin  1 tablet Oral QHS  . sevelamer carbonate  2,400 mg Oral TID WC  . sodium chloride flush  3 mL Intravenous Q12H   . sodium chloride    . heparin 1,400 Units/hr (09/11/17 2124)  . nitroGLYCERIN Stopped (09/10/17 1059)   sodium chloride, acetaminophen, ondansetron (ZOFRAN) IV, sodium chloride flush  Exam: Sitting up in bed, using accessory muscles to breath No jvd Chest rales R side 1/3 up and at L base RRR no mrg ABd obese soft ntnd No LE edema LUA AVG +bruit  Dialysis: [facility] MWF 4h 61min  95.5kg   2/2 bath  LUA AVG  Hep none -venofer 50qwk -calc 0.75 w HD -home BP meds > metoprolol 25 bid      Impression: 1. Dyspnea - concerning for worsening pulm edema, also could be ischemic origin. Get EKG, will plan HD early today if pt is stable enough. May need bipap. Will d/w primary team.  2. NSTEMI/ CM EF 45-50% - severe disease by cath (100% LCX, 90% L main). Possibly for surg on Friday. 3. ESRD HD MWF. HD today off schedule.  4. HTN - bp's low-normal, on MTP 25 bid  (home dose) 5. Anemia of CKD - Hb 11's, no esa  6. MBD of CKD - sensipar 120/d, binder ? Auryxia, calcitriol w HD tiw 7. DM w mult complications 8. Hx CVA (x 5 approx)  Plan - stat EKG/ CXR, may need bipap, acute HD, if will tolerate, this am; get vol down   Kelly Splinter MD [place] pager [phone]   09/12/2017, 9:16 AM   Recent Labs  Lab 09/10/17 0644 09/10/17 1558 09/11/17 2036  NA 131* 132* 129*  K 4.6 4.3 4.2  CL 93* 92* 92*  CO2 24 24 23   GLUCOSE 238* 235* 277*  BUN 68* 72* 45*  CREATININE 10.19* 10.61* 8.24*  CALCIUM 8.1* 8.0* 7.9*  PHOS  --  6.4*  --    Recent Labs  Lab 09/08/17 0046 09/10/17 1558  AST 25  --   ALT 13*  --   ALKPHOS 50  --   BILITOT 0.8  --   PROT 6.5  --   ALBUMIN 3.5 2.7*   Recent Labs  Lab 09/08/17 0046 09/09/17 0318 09/10/17 0644  WBC 11.7* 8.6 6.6  NEUTROABS 9.6*  --   --   HGB 11.6* 10.4* 9.9*  HCT 34.8* 31.3* 29.5*  MCV 89.7 89.2 86.8  PLT 145* 168 199   Iron/TIBC/Ferritin/ %Sat No results found for: IRON, TIBC, FERRITIN, IRONPCTSAT

## 2017-09-12 NOTE — Progress Notes (Signed)
   Subjective: Ernest [name] was seen laying in his bed this morning. He appeared weaker than usual today. He expressed that he has pain in his bilateral shoulders, but more so in his left.   Afternoon events: Was paged by the nurse in the afternoon stating that the patient was not orient to place or time and was a bit delirious. We went to bedside to examine the patient. The patient was alert and oriented x3 at that time. The patient did not have any abnormal murmurs, rubs, or rales and his breath sounds were clear. The patient's delirium appears to be transient and possibly can be attributed to his dialysis. It is possibly also due to hospital acquired delirium or his previous seizure history. The patient's medication were reviewed to note whether to discontinue any centrally acting agents.   Objective:  Vital signs in last 24 hours: Vitals:   09/12/17 1315 09/12/17 1330 09/12/17 1345 09/12/17 1400  BP: 111/88 109/88 120/75 (!) 114/100  Pulse: 97 (!) 104 98 98  Resp: 19 (!) 21 20 16   Temp:      TempSrc:      SpO2: 100% 93% 99% 97%  Weight:      Height:       Physical Exam  Constitutional: He appears well-developed and well-nourished. No distress.  HENT:  Head: Normocephalic and atraumatic.  Eyes: Conjunctivae are normal.  Cardiovascular: Normal rate, regular rhythm, normal heart sounds and intact distal pulses.  Respiratory: Effort normal and breath sounds normal. No respiratory distress. He has no wheezes.  GI: Soft. Bowel sounds are normal. He exhibits no distension. There is no tenderness.  Musculoskeletal: Normal range of motion.  Neurological: He is alert.  Skin: He is not diaphoretic.  1cm raised hematoma present at the site of attempted IJ line placement  Psychiatric: He has a normal mood and affect. His behavior is normal. Judgment and thought content normal.   Assessment/Plan: NSTEMI with left main stenosis 90-95% Coronary artery disease  -Continue clopidogrel 75 mg  daily -Aspirin81mg  given -Continue atorvastatin 40 mg nightly -Continueheparin drip -Continue metoprolol 25mg  bid -Continue atorvastatin 80 mg daily -Heart healthy diet -Potassium chloride 10meq -Magnesium sulfate 39meq -started tranexamic acid   End-stage renal disease  Will get dialysis today 09/12/17 -Continue Calcitrol 0.75 MCG Monday Wednesday Friday -Continue Cinacalcetat 120 mg daily -Continue sevelamer 2400 mg 3 times daily -Continue multivitamin  Diabetesmellitus type II  The patient has poor blood glucose control with sugars ranging 150-307.  -Increased aspart to 4units at meal time -Continue lantus 12 units -Changed SSI from sensitive to moderateq4hrs  Essential hypertension  Patient's blood pressure over the past 24 hours has ranged83-158/65-70. -Continue metoprolol 50 mg twice daily  Seizure -Continue Keppra 500mg  daily on Monday Wednesday Friday  DVT DTO:IZTIWPY 1400u, at therapeutic level  Dispo: Anticipated discharge in approximately  days.   Ernest Mage, MD Internal Medicine PGY1 [phone] 09/12/2017, 2:11 PM

## 2017-09-12 NOTE — Progress Notes (Signed)
Progress Note  Patient Name: Ernest [name] Date of Encounter: 09/12/2017  Primary Cardiologist: Johnsie Cancel   Subjective   Patient endorses shortness of breath throughout the night and this morning. It is somewhat improved with sitting up; he has a productive cough again. He endorses generalized chest tightness but no chest pain and no palpitations.   Inpatient Medications    Scheduled Meds: . aspirin EC  81 mg Oral Daily  . atorvastatin  80 mg Oral q1800  . calcitRIOL  0.75 mcg Oral Q M,W,F-HD  . cinacalcet  120 mg Oral Q supper  . colchicine  0.6 mg Oral Daily  . insulin aspart  0-5 Units Subcutaneous QHS  . insulin aspart  0-9 Units Subcutaneous TID WC  . insulin aspart  3 Units Subcutaneous TID WC  . insulin glargine  12 Units Subcutaneous QHS  . levETIRAcetam  1,000 mg Oral Daily  . levETIRAcetam  500 mg Oral Q M,W,F-HD  . metoprolol tartrate  25 mg Oral BID  . multivitamin  1 tablet Oral QHS  . sevelamer carbonate  2,400 mg Oral TID WC  . sodium chloride flush  3 mL Intravenous Q12H   Continuous Infusions: . sodium chloride    . sodium chloride    . sodium chloride    . heparin 1,400 Units/hr (09/11/17 2124)  . nitroGLYCERIN Stopped (09/10/17 1059)   PRN Meds: sodium chloride, sodium chloride, sodium chloride, acetaminophen, alteplase, heparin, lidocaine (PF), lidocaine-prilocaine, ondansetron (ZOFRAN) IV, pentafluoroprop-tetrafluoroeth, sodium chloride flush   Vital Signs    Vitals:   09/12/17 0200 09/12/17 0300 09/12/17 0400 09/12/17 0806  BP: (!) 129/57 (!) 125/104 (!) 107/41 (!) 110/46  Pulse: (!) 102 99 94 (!) 104  Resp: 19 15 (!) 21 (!) 31  Temp:  (!) 97.5 F (36.4 C)  97.9 F (36.6 C)  TempSrc:  Oral  Oral  SpO2: 100% 100% 96% 93%  Weight:      Height:        Intake/Output Summary (Last 24 hours) at 09/12/2017 0955 Last data filed at 09/12/2017 0800 Gross per 24 hour  Intake 280 ml  Output -  Net 280 ml   Filed Weights   09/10/17 1530  09/10/17 1852 09/11/17 0600  Weight: 214 lb 15.2 oz (97.5 kg) 212 lb 11.9 oz (96.5 kg) 210 lb 12.2 oz (95.6 kg)    Telemetry    SR, PVCs and bigeminy - Personally Reviewed, 09/12/17  ECG    SR - personally reviewed, 09/12/17  Physical Exam   General: sitting up in chair Head: Normocephalic, atraumatic.  Neck: JVD unable to be assessed in current seated position and patient too uncomfortable to lean back. Lungs: mild increased work of breathing, decreased breath sounds in bil lung bases with mid lung field crackles. Heart: RRR, no murmurs, rubs or gallops  Abdomen: soft, NDNT, +BS.  Extremities: No clubbing, cyanosis, edema. Radial and PT pulses intact. Neuro: Alert and oriented X 3. Moves all extremities spontaneously. Psych: Normal affect.  Labs    Chemistry Recent Labs  Lab 09/08/17 0046  09/10/17 0644 09/10/17 1558 09/11/17 2036  NA 134*   < > 131* 132* 129*  K 4.4   < > 4.6 4.3 4.2  CL 94*   < > 93* 92* 92*  CO2 22   < > 24 24 23   GLUCOSE 407*   < > 238* 235* 277*  BUN 71*   < > 68* 72* 45*  CREATININE 10.39*   < >  10.19* 10.61* 8.24*  CALCIUM 9.4   < > 8.1* 8.0* 7.9*  PROT 6.5  --   --   --   --   ALBUMIN 3.5  --   --  2.7*  --   AST 25  --   --   --   --   ALT 13*  --   --   --   --   ALKPHOS 50  --   --   --   --   BILITOT 0.8  --   --   --   --   GFRNONAA 4*   < > 5* 4* 6*  GFRAA 5*   < > 5* 5* 7*  ANIONGAP 18*   < > 14 16* 14   < > = values in this interval not displayed.     Hematology Recent Labs  Lab 09/08/17 0046 09/09/17 0318 09/10/17 0644  WBC 11.7* 8.6 6.6  RBC 3.88* 3.51* 3.40*  HGB 11.6* 10.4* 9.9*  HCT 34.8* 31.3* 29.5*  MCV 89.7 89.2 86.8  MCH 29.9 29.6 29.1  MCHC 33.3 33.2 33.6  RDW 14.7 14.7 14.3  PLT 145* 168 199    Cardiac Enzymes Recent Labs  Lab 09/08/17 0939 09/08/17 1221 09/08/17 2139  TROPONINI 8.33* 10.11* 8.61*    Recent Labs  Lab 09/08/17 0049  TROPIPOC 6.48*     BNP Recent Labs  Lab 09/08/17 0046    BNP 4,125.9*     DDimer No results for input(s): DDIMER in the last 168 hours.    Radiology    Dg Chest Port 1 View  Result Date: 09/10/2017 CLINICAL DATA:  Follow up CHF. Pre-op for CABG on 11/30. Pt unable to give hx due to condition at this time. EXAM: PORTABLE CHEST 1 VIEW COMPARISON:  09/08/2017 FINDINGS: Mildly degraded exam due to AP portable technique and patient body habitus. Patient rotated to the right. Midline trachea. Cardiomegaly accentuated by AP portable technique. Probable small left pleural effusion. Presumed calcified node projecting over the left side of the thoracic inlet. No pneumothorax. Improved mild interstitial edema. Right minor fissure thickening. Worsened left base aeration. IMPRESSION: Improved mild congestive heart failure. Small left pleural effusion with worsened adjacent airspace disease, most likely atelectasis. Electronically Signed   By: Abigail Miyamoto M.D.   On: 09/10/2017 15:34    Cardiac Studies   TTE: 09/08/17  Study Conclusions  - Left ventricle: Inferobasal and posterior lateral hypokinesis EF   similar to echo from 2016. The cavity size was normal. Wall   thickness was normal. Systolic function was mildly reduced. The   estimated ejection fraction was in the range of 45% to 50%. - Mitral valve: Calcified annulus. Mildly thickened leaflets .   There was mild regurgitation. - Left atrium: The atrium was moderately dilated. - Pulmonary arteries: PA peak pressure: 50 mm Hg (S).  Cath: 11/13  Cath 08/26/2012 Left mainstem: There is a 50% eccentric ostial left main stenosis.  Left anterior descending (LAD): The LAD is heavily calcified throughout the proximal vessel. There is 50-70% stenosis in the proximal vessel followed by segment of 40% disease in the proximal to mid vessel.  The ramus intermediate branch is moderate in size and has mild disease at the ostium up to 30%.  Left circumflex (LCx): The left circumflex has minor  irregularities up to 20%.  Right coronary artery (RCA): The right coronary has a 50% stenosis in the proximal vessel. The remainder of the vessel is without significant  disease.  Left ventriculography: There is apical hypokinesis with mild left ventricular dysfunction. Ejection fraction is estimated at 50%.  Final Conclusions:  1. There is modest disease involving the ostial left main, proximal LAD and proximal right coronary. These lesions do not appear to be hemodynamically significant. 2. Mild left ventricular dysfunction with apical hypokinesis.  Recommendations: Cardiac catheterization findings do not correlate well with his Myoview study. His ejection fraction looks better angiographically than 40%. Patient has been asymptomatic. I would recommend medical management.  LHC 09/09/2017:  Ost LM lesion is 65% stenosed.  Mid LM lesion is 90% stenosed.  Ost Cx to Prox Cx lesion is 100% stenosed.  Ost LAD lesion is 20% stenosed.  Acute Mrg lesion is 60% stenosed. Significant coronary calcification with high-grade 65% ostial and 90-95% distal left main stenosis, calcification of the proximal LAD with 20-25% narrowing, large ramus intermediate vessel; total occlusion of the left circumflex at the ostium.  Dominant RCA with 60% ostial proximal narrowing in an RV marginal branch and collateralization to the distal circumflex. Recommend surgical consult for CABG.  Patient Profile     71 y.o. male with a hx of CAD, chronic systolic HF, COPD, ESRD on HD MWF, HTN, HLD, DM II and h/o CVA who presented with acute on chronic systolic HF and found to have NSTEMI with with 90-95% left main disease, recommended for CABG.  Assessment & Plan    NSTEMI CAD:  Patient with multivessel CAD and 90-95% left main stenosis - recommendation is for CABG. He has been scheduled for surgery on 11/30; holding plavix until post-op. --asa 81mg  daily; holding plavix --atorvastatin 80mg  daily --metoprolol 25mg   BID --heparin gtt --CTS on board for CABG - 11/30  NSVT, PVCS:  PVCs, less runs of NSVT  Acute on chronic systolic HF:  EF stable on echo this admission. Nephrology following for HD. Lung exam consistent with fluid overload; EKG without arrhythmia or ischemic changes compared to prior. CXR pending.  ERSD on HD MWF:  Nephrology on board for HD - session today.  DM: Management per primary  Signed, Alphonzo Grieve, MD  09/12/2017, 9:55 AM   For questions or updates, please contact [place] Please consult [email or web address] for contact info under Cardiology/STEMI.  I have examined the patient and reviewed assessment and plan and discussed with patient.  Agree with above as stated.  Shortness of breath is worse today. Also has left shoulder pain, which he thinks is his gout.  OK to give a dose of colchicine.  Nurse asking about benzos, butwill try to hold off for now. Dialysis should help with fluid status.  Plan for CABG tomorrow for severe left main disease.  Larae Grooms

## 2017-09-12 NOTE — Progress Notes (Signed)
Notified by Dr. Jonnie Finner that patient has increased dyspnea this morning. Patient is currently at dry weight and is planned for dialysis today as tolerated. An EKG and portable CXR have been ordered.  Patient seen afterwards, sitting up in the chair eating breakfast. He reports feeling more short of breath this morning with some improvement sitting up. He is saturating 92-97% on 3 L O2 via . Bibasilar rales are heard on auscultation. Heart rate is borderline tachycardic with regular rhythm.  Repeat EKG shows sinus tachycardia with ST depressions in V4-6 unchanged from 11/24 EKG. Freqeunt PVCs from 11/25 EKG are no longer present.  Agree with CXR which is pending and HD as tolerated. He appears to be more comfortable sitting up. Will place order for Bipap as needed for cardiogenic pulmonary edema. Primary team notified.  Zada Finders, MD IMTS PGY-3

## 2017-09-12 NOTE — Progress Notes (Signed)
Dialysis treatment completed.  2500 mL ultrafiltrated and net fluid removal 2000 mL.    Patient oriented to self, d/o to time, place, situation. Lung sounds diminished to ausculation in all fields. Generalized edema. Cardiac: NSR to ST, frequent PVC.  Disconnected lines and removed needles.  Pressure held for 10 minutes and band aid/gauze dressing applied.  Report given to bedside RN, Colletta Maryland.

## 2017-09-12 NOTE — Progress Notes (Signed)
Arrived to patient room 2H-17 at 1100.  Reviewed treatment plan and this RN agrees.  Report received from bedside RN, Colletta Maryland.  Consent verified.  Patient A & O X 4. Lung sounds diminished to ausculation in all fields. Generalized non pitting edema. Cardiac: NSR.  Prepped LUAVG with alcohol and cannulated with two 15 gauge needles.  Pulsation of blood noted.  Flushed access well with saline per protocol.  Connected and secured lines and initiated tx at 1143.  UF goal of 2500 mL and net fluid removal of 2000 mL.  Will continue to monitor.

## 2017-09-12 NOTE — Progress Notes (Signed)
Inpatient Diabetes Program Recommendations  AACE/ADA: New Consensus Statement on Inpatient Glycemic Control (2015)  Target Ranges:  Prepandial:   less than 140 mg/dL      Peak postprandial:   less than 180 mg/dL (1-2 hours)      Critically ill patients:  140 - 180 mg/dL   Results for KEVONTAE, BURGOON (MRN [id number]) as of 09/12/2017 09:29  Ref. Range 09/11/2017 08:02 09/11/2017 11:29 09/11/2017 16:53 09/11/2017 22:11 09/12/2017 08:03  Glucose-Capillary Latest Ref Range: 65 - 99 mg/dL 107 (H) 265 (H) 159 (H) 274 (H) 307 (H)  Results for TRISTEN, LUCE (MRN [id number]) as of 09/12/2017 09:29  Ref. Range 09/10/2017 08:02 09/10/2017 08:24 09/10/2017 11:58 09/10/2017 20:55  Glucose-Capillary Latest Ref Range: 65 - 99 mg/dL 286 (H) 238 (H) 191 (H) 158 (H)   Review of Glycemic Control  Diabetes history: DM2 Outpatient Diabetes medications: Lantus 10 units QHS, Humalog 3 units TID with meals Current orders for Inpatient glycemic control: Lantus 12 units QHS, Novolog 0-9 units TID with meals, Novolog 0-5 units QHS, Novolog 3 units TID with meals for meal coverage   Inpatient Diabetes Program Recommendations:  Insulin - Basal: Please consider increasing Lantus to 14 units QHS. Insulin - Meal Coverage: Please consider increasing meal coverage to Novolog 6 units TID with meals. HgbA1C: A1C 9.1% on 09/08/17 indicating an average glucose of 214 mg/dl over the past 2-3 months. Patient needs to follow up with PCP regarding improve DM control.  Thanks, Barnie Alderman, RN, MSN, CDE Diabetes Coordinator Inpatient Diabetes Program [phone] (Team Pager from 8am to 5pm)

## 2017-09-12 NOTE — Progress Notes (Signed)
3 Days Post-Op Procedure(s) (LRB): LEFT HEART CATH AND CORONARY ANGIOGRAPHY (N/A) Subjective: Short of breath earlier today, better now on HD  Objective: Vital signs in last 24 hours: Temp:  [97.5 F (36.4 C)-98 F (36.7 C)] 97.8 F (36.6 C) (11/29 1211) Pulse Rate:  [88-124] 103 (11/29 1245) Cardiac Rhythm: Normal sinus rhythm (11/29 1100) Resp:  [15-31] 24 (11/29 1245) BP: (103-158)/(30-104) 110/73 (11/29 1245) SpO2:  [76 %-100 %] 95 % (11/29 1245) Weight:  [214 lb 4.6 oz (97.2 kg)] 214 lb 4.6 oz (97.2 kg) (11/29 1100)  Hemodynamic parameters for last 24 hours:    Intake/Output from previous day: 11/28 0701 - 11/29 0700 In: 294 [I.V.:294] Out: -  Intake/Output this shift: Total I/O In: 134 [P.O.:120; I.V.:14] Out: -   General appearance: alert, cooperative and no distress Neurologic: intact Heart: regular rate and rhythm Lungs: clear anteriorly  Lab Results: Recent Labs    09/10/17 0644 09/12/17 1023  WBC 6.6 12.7*  HGB 9.9* 10.3*  HCT 29.5* 30.3*  PLT 199 254   BMET:  Recent Labs    09/11/17 2036 09/12/17 1023  NA 129* 130*  K 4.2 4.6  CL 92* 93*  CO2 23 21*  GLUCOSE 277* 267*  BUN 45* 50*  CREATININE 8.24* 9.15*  CALCIUM 7.9* 8.3*    PT/INR: No results for input(s): LABPROT, INR in the last 72 hours. ABG    Component Value Date/Time   PHART 7.359 06/08/2013 0531   HCO3 27.3 (H) 06/08/2013 0531   TCO2 34 10/27/2016 1626   O2SAT 98.0 06/08/2013 0531   CBG (last 3)  Recent Labs    09/11/17 2211 09/12/17 0803 09/12/17 1214  GLUCAP 274* 307* 167*    Assessment/Plan: S/P Procedure(s) (LRB): LEFT HEART CATH AND CORONARY ANGIOGRAPHY (N/A) left main CAD s/p non STEMI  Continues to have intermittent SOB- I suspect at least in part due to CAD, stable currently For high risk CABG in AM All questions answered CBG control is poor   LOS: 4 days    Melrose Nakayama 09/12/2017

## 2017-09-12 NOTE — Progress Notes (Signed)
ANTICOAGULATION CONSULT NOTE - Follow Up Consult  Pharmacy Consult for heparin Indication: NSTEMI  Labs: Recent Labs    09/10/17 0644 09/10/17 1558 09/11/17 0602 09/11/17 2036 09/12/17 0436  HGB 9.9*  --   --   --   --   HCT 29.5*  --   --   --   --   PLT 199  --   --   --   --   HEPARINUNFRC 0.34  --  0.52  --  0.54  CREATININE 10.19* 10.61*  --  8.24*  --     Assessment: 71yo male on heparin for ACS. Cath report showed significant calcification (65% Ost LM, 90-95% mid LM, 100% Ost Cx to Prox Cx, and 60% at acute Mrg), recommending surgical consultation for CABG. Heparin restarted 8 hours after sheath removal (10/26 at 1415).  Heparin level therapeutic at 0.5 on heparin 1400 units/hr. CBC missed the past few days will recheck in am. No bleeding events or concerns noted.  Goal of Therapy:  Heparin level 0.3-0.7 units/ml   Plan:  Continue heparin at 1400 units/hr Monitor daily HL, CBC, and s/sx of bleeding Follow along for plan by [facility] for surgery   Erin Hearing PharmD., BCPS Clinical Pharmacist Pager [phone] 09/12/2017 9:24 AM

## 2017-09-13 ENCOUNTER — Inpatient Hospital Stay (HOSPITAL_COMMUNITY)
Admission: EM | Disposition: A | Discharge status: Skilled Nursing Facility | Payer: Self-pay | Source: Home / Self Care | Attending: Thoracic Surgery (Cardiothoracic Vascular Surgery)

## 2017-09-13 ENCOUNTER — Inpatient Hospital Stay (HOSPITAL_COMMUNITY): Payer: Medicare Other | Admitting: Anesthesiology

## 2017-09-13 ENCOUNTER — Inpatient Hospital Stay (HOSPITAL_COMMUNITY): Payer: Medicare Other

## 2017-09-13 DIAGNOSIS — I340 Nonrheumatic mitral (valve) insufficiency: Secondary | ICD-10-CM

## 2017-09-13 DIAGNOSIS — I2510 Atherosclerotic heart disease of native coronary artery without angina pectoris: Secondary | ICD-10-CM

## 2017-09-13 DIAGNOSIS — E1129 Type 2 diabetes mellitus with other diabetic kidney complication: Secondary | ICD-10-CM | POA: Diagnosis not present

## 2017-09-13 DIAGNOSIS — Z992 Dependence on renal dialysis: Secondary | ICD-10-CM | POA: Diagnosis not present

## 2017-09-13 DIAGNOSIS — I214 Non-ST elevation (NSTEMI) myocardial infarction: Secondary | ICD-10-CM

## 2017-09-13 DIAGNOSIS — N186 End stage renal disease: Secondary | ICD-10-CM | POA: Diagnosis not present

## 2017-09-13 HISTORY — PX: MITRAL VALVE REPAIR: SHX2039

## 2017-09-13 HISTORY — PX: CORONARY ARTERY BYPASS GRAFT: SHX141

## 2017-09-13 HISTORY — PX: TEE WITHOUT CARDIOVERSION: SHX5443

## 2017-09-13 HISTORY — PX: MITRAL VALVE REPAIR (MV)/CORONARY ARTERY BYPASS GRAFTING (CABG): SHX5983

## 2017-09-13 LAB — CBC
HCT: 29.9 % — ABNORMAL LOW (ref 39.0–52.0)
HCT: 37 % — ABNORMAL LOW (ref 39.0–52.0)
HEMATOCRIT: 35.1 % — AB (ref 39.0–52.0)
HEMOGLOBIN: 12.5 g/dL — AB (ref 13.0–17.0)
HEMOGLOBIN: 11.8 g/dL — AB (ref 13.0–17.0)
Hemoglobin: 10.2 g/dL — ABNORMAL LOW (ref 13.0–17.0)
MCH: 29.3 pg (ref 26.0–34.0)
MCH: 29.1 pg (ref 26.0–34.0)
MCH: 28.5 pg (ref 26.0–34.0)
MCHC: 34.1 g/dL (ref 30.0–36.0)
MCHC: 33.8 g/dL (ref 30.0–36.0)
MCHC: 33.6 g/dL (ref 30.0–36.0)
MCV: 85.9 fL (ref 78.0–100.0)
MCV: 86 fL (ref 78.0–100.0)
MCV: 84.8 fL (ref 78.0–100.0)
PLATELETS: 280 10*3/uL (ref 150–400)
PLATELETS: 166 10*3/uL (ref 150–400)
Platelets: 129 10*3/uL — ABNORMAL LOW (ref 150–400)
RBC: 3.48 MIL/uL — ABNORMAL LOW (ref 4.22–5.81)
RBC: 4.3 MIL/uL (ref 4.22–5.81)
RBC: 4.14 MIL/uL — AB (ref 4.22–5.81)
RDW: 14.1 % (ref 11.5–15.5)
RDW: 14.4 % (ref 11.5–15.5)
RDW: 14.7 % (ref 11.5–15.5)
WBC: 12.4 10*3/uL — AB (ref 4.0–10.5)
WBC: 13.9 10*3/uL — ABNORMAL HIGH (ref 4.0–10.5)
WBC: 14.5 10*3/uL — AB (ref 4.0–10.5)

## 2017-09-13 LAB — GLUCOSE, CAPILLARY
GLUCOSE-CAPILLARY: 134 mg/dL — AB (ref 65–99)
GLUCOSE-CAPILLARY: 135 mg/dL — AB (ref 65–99)
GLUCOSE-CAPILLARY: 136 mg/dL — AB (ref 65–99)
GLUCOSE-CAPILLARY: 145 mg/dL — AB (ref 65–99)
GLUCOSE-CAPILLARY: 146 mg/dL — AB (ref 65–99)
GLUCOSE-CAPILLARY: 167 mg/dL — AB (ref 65–99)
Glucose-Capillary: 157 mg/dL — ABNORMAL HIGH (ref 65–99)
Glucose-Capillary: 136 mg/dL — ABNORMAL HIGH (ref 65–99)
Glucose-Capillary: 128 mg/dL — ABNORMAL HIGH (ref 65–99)
Glucose-Capillary: 151 mg/dL — ABNORMAL HIGH (ref 65–99)

## 2017-09-13 LAB — POCT I-STAT, CHEM 8
BUN: 27 mg/dL — ABNORMAL HIGH (ref 6–20)
BUN: 25 mg/dL — AB (ref 6–20)
BUN: 28 mg/dL — ABNORMAL HIGH (ref 6–20)
BUN: 26 mg/dL — ABNORMAL HIGH (ref 6–20)
BUN: 26 mg/dL — ABNORMAL HIGH (ref 6–20)
BUN: 28 mg/dL — ABNORMAL HIGH (ref 6–20)
BUN: 26 mg/dL — ABNORMAL HIGH (ref 6–20)
BUN: 27 mg/dL — AB (ref 6–20)
BUN: 27 mg/dL — AB (ref 6–20)
CALCIUM ION: 0.92 mmol/L — AB (ref 1.15–1.40)
CALCIUM ION: 1.05 mmol/L — AB (ref 1.15–1.40)
CALCIUM ION: 0.95 mmol/L — AB (ref 1.15–1.40)
CALCIUM ION: 0.93 mmol/L — AB (ref 1.15–1.40)
CALCIUM ION: 1.04 mmol/L — AB (ref 1.15–1.40)
CHLORIDE: 97 mmol/L — AB (ref 101–111)
CHLORIDE: 101 mmol/L (ref 101–111)
CHLORIDE: 106 mmol/L (ref 101–111)
CREATININE: 6.3 mg/dL — AB (ref 0.61–1.24)
CREATININE: 5.8 mg/dL — AB (ref 0.61–1.24)
Calcium, Ion: 1 mmol/L — ABNORMAL LOW (ref 1.15–1.40)
Calcium, Ion: 0.96 mmol/L — ABNORMAL LOW (ref 1.15–1.40)
Calcium, Ion: 0.96 mmol/L — ABNORMAL LOW (ref 1.15–1.40)
Calcium, Ion: 1.06 mmol/L — ABNORMAL LOW (ref 1.15–1.40)
Chloride: 97 mmol/L — ABNORMAL LOW (ref 101–111)
Chloride: 96 mmol/L — ABNORMAL LOW (ref 101–111)
Chloride: 101 mmol/L (ref 101–111)
Chloride: 102 mmol/L (ref 101–111)
Chloride: 104 mmol/L (ref 101–111)
Chloride: 102 mmol/L (ref 101–111)
Creatinine, Ser: 5.7 mg/dL — ABNORMAL HIGH (ref 0.61–1.24)
Creatinine, Ser: 6.3 mg/dL — ABNORMAL HIGH (ref 0.61–1.24)
Creatinine, Ser: 5.7 mg/dL — ABNORMAL HIGH (ref 0.61–1.24)
Creatinine, Ser: 5.6 mg/dL — ABNORMAL HIGH (ref 0.61–1.24)
Creatinine, Ser: 6.1 mg/dL — ABNORMAL HIGH (ref 0.61–1.24)
Creatinine, Ser: 5.9 mg/dL — ABNORMAL HIGH (ref 0.61–1.24)
Creatinine, Ser: 5.3 mg/dL — ABNORMAL HIGH (ref 0.61–1.24)
GLUCOSE: 155 mg/dL — AB (ref 65–99)
GLUCOSE: 145 mg/dL — AB (ref 65–99)
GLUCOSE: 172 mg/dL — AB (ref 65–99)
Glucose, Bld: 123 mg/dL — ABNORMAL HIGH (ref 65–99)
Glucose, Bld: 117 mg/dL — ABNORMAL HIGH (ref 65–99)
Glucose, Bld: 114 mg/dL — ABNORMAL HIGH (ref 65–99)
Glucose, Bld: 115 mg/dL — ABNORMAL HIGH (ref 65–99)
Glucose, Bld: 121 mg/dL — ABNORMAL HIGH (ref 65–99)
Glucose, Bld: 166 mg/dL — ABNORMAL HIGH (ref 65–99)
HCT: 29 % — ABNORMAL LOW (ref 39.0–52.0)
HCT: 24 % — ABNORMAL LOW (ref 39.0–52.0)
HCT: 27 % — ABNORMAL LOW (ref 39.0–52.0)
HCT: 23 % — ABNORMAL LOW (ref 39.0–52.0)
HCT: 21 % — ABNORMAL LOW (ref 39.0–52.0)
HEMATOCRIT: 23 % — AB (ref 39.0–52.0)
HEMATOCRIT: 24 % — AB (ref 39.0–52.0)
HEMATOCRIT: 27 % — AB (ref 39.0–52.0)
HEMATOCRIT: 35 % — AB (ref 39.0–52.0)
HEMOGLOBIN: 9.9 g/dL — AB (ref 13.0–17.0)
HEMOGLOBIN: 7.8 g/dL — AB (ref 13.0–17.0)
HEMOGLOBIN: 7.8 g/dL — AB (ref 13.0–17.0)
HEMOGLOBIN: 8.2 g/dL — AB (ref 13.0–17.0)
HEMOGLOBIN: 7.1 g/dL — AB (ref 13.0–17.0)
Hemoglobin: 8.2 g/dL — ABNORMAL LOW (ref 13.0–17.0)
Hemoglobin: 9.2 g/dL — ABNORMAL LOW (ref 13.0–17.0)
Hemoglobin: 9.2 g/dL — ABNORMAL LOW (ref 13.0–17.0)
Hemoglobin: 11.9 g/dL — ABNORMAL LOW (ref 13.0–17.0)
POTASSIUM: 3.7 mmol/L (ref 3.5–5.1)
POTASSIUM: 4.5 mmol/L (ref 3.5–5.1)
POTASSIUM: 4.9 mmol/L (ref 3.5–5.1)
Potassium: 3.2 mmol/L — ABNORMAL LOW (ref 3.5–5.1)
Potassium: 3.6 mmol/L (ref 3.5–5.1)
Potassium: 4.7 mmol/L (ref 3.5–5.1)
Potassium: 4.2 mmol/L (ref 3.5–5.1)
Potassium: 4.7 mmol/L (ref 3.5–5.1)
Potassium: 4.4 mmol/L (ref 3.5–5.1)
SODIUM: 137 mmol/L (ref 135–145)
SODIUM: 136 mmol/L (ref 135–145)
SODIUM: 137 mmol/L (ref 135–145)
SODIUM: 136 mmol/L (ref 135–145)
SODIUM: 138 mmol/L (ref 135–145)
Sodium: 134 mmol/L — ABNORMAL LOW (ref 135–145)
Sodium: 134 mmol/L — ABNORMAL LOW (ref 135–145)
Sodium: 136 mmol/L (ref 135–145)
Sodium: 138 mmol/L (ref 135–145)
TCO2: 25 mmol/L (ref 22–32)
TCO2: 26 mmol/L (ref 22–32)
TCO2: 26 mmol/L (ref 22–32)
TCO2: 26 mmol/L (ref 22–32)
TCO2: 25 mmol/L (ref 22–32)
TCO2: 26 mmol/L (ref 22–32)
TCO2: 24 mmol/L (ref 22–32)
TCO2: 22 mmol/L (ref 22–32)
TCO2: 21 mmol/L — AB (ref 22–32)

## 2017-09-13 LAB — POCT I-STAT 3, ART BLOOD GAS (G3+)
ACID-BASE DEFICIT: 11 mmol/L — AB (ref 0.0–2.0)
ACID-BASE DEFICIT: 4 mmol/L — AB (ref 0.0–2.0)
ACID-BASE DEFICIT: 7 mmol/L — AB (ref 0.0–2.0)
ACID-BASE EXCESS: 2 mmol/L (ref 0.0–2.0)
Bicarbonate: 26.9 mmol/L (ref 20.0–28.0)
Bicarbonate: 14.9 mmol/L — ABNORMAL LOW (ref 20.0–28.0)
Bicarbonate: 21 mmol/L (ref 20.0–28.0)
Bicarbonate: 19.1 mmol/L — ABNORMAL LOW (ref 20.0–28.0)
O2 SAT: 100 %
O2 SAT: 92 %
O2 SAT: 93 %
O2 SAT: 95 %
PCO2 ART: 41.1 mmHg (ref 32.0–48.0)
PCO2 ART: 37.7 mmHg (ref 32.0–48.0)
PH ART: 7.351 (ref 7.350–7.450)
PO2 ART: 82 mmHg — AB (ref 83.0–108.0)
Patient temperature: 36.2
Patient temperature: 36.5
TCO2: 28 mmol/L (ref 22–32)
TCO2: 16 mmol/L — AB (ref 22–32)
TCO2: 22 mmol/L (ref 22–32)
TCO2: 20 mmol/L — ABNORMAL LOW (ref 22–32)
pCO2 arterial: 31.1 mmHg — ABNORMAL LOW (ref 32.0–48.0)
pCO2 arterial: 40.2 mmHg (ref 32.0–48.0)
pH, Arterial: 7.424 (ref 7.350–7.450)
pH, Arterial: 7.284 — ABNORMAL LOW (ref 7.350–7.450)
pH, Arterial: 7.283 — ABNORMAL LOW (ref 7.350–7.450)
pO2, Arterial: 301 mmHg — ABNORMAL HIGH (ref 83.0–108.0)
pO2, Arterial: 68 mmHg — ABNORMAL LOW (ref 83.0–108.0)
pO2, Arterial: 68 mmHg — ABNORMAL LOW (ref 83.0–108.0)

## 2017-09-13 LAB — BLOOD GAS, ARTERIAL
Acid-Base Excess: 3.7 mmol/L — ABNORMAL HIGH (ref 0.0–2.0)
BICARBONATE: 27.7 mmol/L (ref 20.0–28.0)
Drawn by: 44166
O2 CONTENT: 3 L/min
O2 SAT: 95.6 %
PATIENT TEMPERATURE: 98.1
pCO2 arterial: 40.8 mmHg (ref 32.0–48.0)
pH, Arterial: 7.445 (ref 7.350–7.450)
pO2, Arterial: 79.5 mmHg — ABNORMAL LOW (ref 83.0–108.0)

## 2017-09-13 LAB — BASIC METABOLIC PANEL
ANION GAP: 12 (ref 5–15)
BUN: 28 mg/dL — AB (ref 6–20)
CALCIUM: 8.2 mg/dL — AB (ref 8.9–10.3)
CO2: 27 mmol/L (ref 22–32)
CREATININE: 6.42 mg/dL — AB (ref 0.61–1.24)
Chloride: 93 mmol/L — ABNORMAL LOW (ref 101–111)
GFR calc Af Amer: 9 mL/min — ABNORMAL LOW (ref >60)
GFR, EST NON AFRICAN AMERICAN: 8 mL/min — AB (ref >60)
GLUCOSE: 141 mg/dL — AB (ref 65–99)
Potassium: 4 mmol/L (ref 3.5–5.1)
Sodium: 132 mmol/L — ABNORMAL LOW (ref 135–145)

## 2017-09-13 LAB — PREPARE RBC (CROSSMATCH)

## 2017-09-13 LAB — CREATININE, SERUM
CREATININE: 6.24 mg/dL — AB (ref 0.61–1.24)
GFR, EST AFRICAN AMERICAN: 10 mL/min — AB (ref >60)
GFR, EST NON AFRICAN AMERICAN: 8 mL/min — AB (ref >60)

## 2017-09-13 LAB — PLATELET COUNT: Platelets: 158 10*3/uL (ref 150–400)

## 2017-09-13 LAB — POCT I-STAT 4, (NA,K, GLUC, HGB,HCT)
GLUCOSE: 154 mg/dL — AB (ref 65–99)
HEMATOCRIT: 37 % — AB (ref 39.0–52.0)
Hemoglobin: 12.6 g/dL — ABNORMAL LOW (ref 13.0–17.0)
POTASSIUM: 4.7 mmol/L (ref 3.5–5.1)
SODIUM: 139 mmol/L (ref 135–145)

## 2017-09-13 LAB — APTT
aPTT: 46 seconds — ABNORMAL HIGH (ref 24–36)
aPTT: 40 seconds — ABNORMAL HIGH (ref 24–36)

## 2017-09-13 LAB — MAGNESIUM
Magnesium: 2.2 mg/dL (ref 1.7–2.4)
Magnesium: 2.2 mg/dL (ref 1.7–2.4)

## 2017-09-13 LAB — PROTIME-INR
INR: 1.56
PROTHROMBIN TIME: 18.5 s — AB (ref 11.4–15.2)

## 2017-09-13 LAB — HEMOGLOBIN AND HEMATOCRIT, BLOOD
HEMATOCRIT: 18.2 % — AB (ref 39.0–52.0)
HEMOGLOBIN: 6.2 g/dL — AB (ref 13.0–17.0)

## 2017-09-13 LAB — CK: Total CK: 844 U/L — ABNORMAL HIGH (ref 49–397)

## 2017-09-13 LAB — HEPARIN LEVEL (UNFRACTIONATED): Heparin Unfractionated: <0.1 IU/mL — ABNORMAL LOW (ref 0.30–0.70)

## 2017-09-13 SURGERY — CORONARY ARTERY BYPASS GRAFTING (CABG)
Anesthesia: General | Site: Esophagus

## 2017-09-13 MED ORDER — LIDOCAINE 2% (20 MG/ML) 5 ML SYRINGE
INTRAMUSCULAR | Status: AC
  Filled 2017-09-13: qty 5

## 2017-09-13 MED ORDER — PHENYLEPHRINE HCL 10 MG/ML IJ SOLN
INTRAMUSCULAR | Status: DC | PRN
  Administered 2017-09-13: 80 ug via INTRAVENOUS
  Administered 2017-09-13: 120 ug via INTRAVENOUS

## 2017-09-13 MED ORDER — CALCIUM CHLORIDE 10 % IV SOLN
INTRAVENOUS | Status: DC | PRN
  Administered 2017-09-13: 200 mg via INTRAVENOUS
  Administered 2017-09-13: 300 mg via INTRAVENOUS
  Administered 2017-09-13: 200 mg via INTRAVENOUS

## 2017-09-13 MED ORDER — HEMOSTATIC AGENTS (NO CHARGE) OPTIME
TOPICAL | Status: DC | PRN
  Administered 2017-09-13: 1 via TOPICAL

## 2017-09-13 MED ORDER — PROPOFOL 10 MG/ML IV BOLUS
INTRAVENOUS | Status: AC
  Filled 2017-09-13: qty 20

## 2017-09-13 MED ORDER — ROCURONIUM BROMIDE 10 MG/ML (PF) SYRINGE
PREFILLED_SYRINGE | INTRAVENOUS | Status: AC
  Filled 2017-09-13: qty 5

## 2017-09-13 MED ORDER — ACETAMINOPHEN 650 MG RE SUPP
650.0000 mg | Freq: Once | RECTAL | Status: AC
  Administered 2017-09-13: 650 mg via RECTAL

## 2017-09-13 MED ORDER — AMIODARONE HCL IN DEXTROSE 360-4.14 MG/200ML-% IV SOLN
30.0000 mg/h | INTRAVENOUS | Status: DC
  Administered 2017-09-13: 60 mg/h via INTRAVENOUS
  Administered 2017-09-14 – 2017-09-16 (×8): 30 mg/h via INTRAVENOUS
  Filled 2017-09-13 (×8): qty 200

## 2017-09-13 MED ORDER — ASPIRIN 81 MG PO CHEW: 324.0000 mg | CHEWABLE_TABLET | Freq: Every day | ORAL | Status: DC

## 2017-09-13 MED ORDER — FENTANYL CITRATE (PF) 250 MCG/5ML IJ SOLN
INTRAMUSCULAR | Status: DC | PRN
  Administered 2017-09-13 (×2): 100 ug via INTRAVENOUS
  Administered 2017-09-13 (×3): 150 ug via INTRAVENOUS
  Administered 2017-09-13 (×2): 100 ug via INTRAVENOUS
  Administered 2017-09-13: 150 ug via INTRAVENOUS

## 2017-09-13 MED ORDER — AMIODARONE LOAD VIA INFUSION
150.0000 mg | Freq: Once | INTRAVENOUS | Status: AC
  Administered 2017-09-13: 150 mg via INTRAVENOUS
  Filled 2017-09-13: qty 83.34

## 2017-09-13 MED ORDER — SODIUM CHLORIDE 0.45 % IV SOLN
INTRAVENOUS | Status: DC | PRN
  Administered 2017-09-13: 15:00:00 via INTRAVENOUS

## 2017-09-13 MED ORDER — ORAL CARE MOUTH RINSE
15.0000 mL | Freq: Four times a day (QID) | OROMUCOSAL | Status: DC
  Administered 2017-09-14 – 2017-10-03 (×65): 15 mL via OROMUCOSAL

## 2017-09-13 MED ORDER — DOCUSATE SODIUM 100 MG PO CAPS
200.0000 mg | ORAL_CAPSULE | Freq: Every day | ORAL | Status: DC
  Administered 2017-09-14 – 2017-09-27 (×8): 200 mg via ORAL
  Filled 2017-09-13 (×8): qty 2

## 2017-09-13 MED ORDER — DEXTROSE 5 % IV SOLN
0.0000 ug/min | INTRAVENOUS | Status: DC
  Administered 2017-09-13: 4 ug/min via INTRAVENOUS
  Filled 2017-09-13: qty 4

## 2017-09-13 MED ORDER — MIDAZOLAM HCL 5 MG/5ML IJ SOLN
INTRAMUSCULAR | Status: DC | PRN
  Administered 2017-09-13: 2 mg via INTRAVENOUS
  Administered 2017-09-13: 4 mg via INTRAVENOUS

## 2017-09-13 MED ORDER — POTASSIUM CHLORIDE 10 MEQ/50ML IV SOLN: 10.0000 meq | INTRAVENOUS | Status: AC

## 2017-09-13 MED ORDER — ONDANSETRON HCL 4 MG/2ML IJ SOLN: 4.0000 mg | Freq: Four times a day (QID) | INTRAMUSCULAR | Status: DC | PRN

## 2017-09-13 MED ORDER — CALCIUM CHLORIDE 10 % IV SOLN
INTRAVENOUS | Status: AC
  Filled 2017-09-13: qty 10

## 2017-09-13 MED ORDER — MORPHINE SULFATE (PF) 2 MG/ML IV SOLN: 1.0000 mg | INTRAVENOUS | Status: AC | PRN

## 2017-09-13 MED ORDER — BISACODYL 10 MG RE SUPP
10.0000 mg | Freq: Every day | RECTAL | Status: DC
  Filled 2017-09-13 (×2): qty 1

## 2017-09-13 MED ORDER — EPHEDRINE SULFATE 50 MG/ML IJ SOLN
INTRAMUSCULAR | Status: DC | PRN
  Administered 2017-09-13 (×2): 10 mg via INTRAVENOUS

## 2017-09-13 MED ORDER — SODIUM CHLORIDE 0.9 % IV SOLN
INTRAVENOUS | Status: DC
  Administered 2017-09-14: 1.7 [IU]/h via INTRAVENOUS
  Filled 2017-09-13: qty 1

## 2017-09-13 MED ORDER — IOPAMIDOL (ISOVUE-300) INJECTION 61%
INTRAVENOUS | Status: AC
  Filled 2017-09-13: qty 50

## 2017-09-13 MED ORDER — AMIODARONE HCL IN DEXTROSE 360-4.14 MG/200ML-% IV SOLN
60.0000 mg/h | INTRAVENOUS | Status: DC
  Administered 2017-09-13 (×2): 60 mg/h via INTRAVENOUS
  Filled 2017-09-13: qty 200

## 2017-09-13 MED ORDER — CHLORHEXIDINE GLUCONATE 0.12% ORAL RINSE (MEDLINE KIT)
15.0000 mL | Freq: Two times a day (BID) | OROMUCOSAL | Status: DC
  Administered 2017-09-13 – 2017-10-04 (×34): 15 mL via OROMUCOSAL

## 2017-09-13 MED ORDER — DEXTROSE 5 % IV SOLN
1.5000 g | Freq: Two times a day (BID) | INTRAVENOUS | Status: DC
  Filled 2017-09-13: qty 1.5

## 2017-09-13 MED ORDER — ALBUMIN HUMAN 5 % IV SOLN
250.0000 mL | INTRAVENOUS | Status: AC | PRN
  Administered 2017-09-13: 250 mL via INTRAVENOUS

## 2017-09-13 MED ORDER — MORPHINE SULFATE (PF) 2 MG/ML IV SOLN: 2.0000 mg | INTRAVENOUS | Status: DC | PRN

## 2017-09-13 MED ORDER — ACETAMINOPHEN 160 MG/5ML PO SOLN
1000.0000 mg | Freq: Four times a day (QID) | ORAL | Status: AC
  Administered 2017-09-15 – 2017-09-16 (×3): 1000 mg
  Filled 2017-09-13 (×3): qty 40.6

## 2017-09-13 MED ORDER — LIDOCAINE 2% (20 MG/ML) 5 ML SYRINGE
INTRAMUSCULAR | Status: DC | PRN
  Administered 2017-09-13: 100 mg via INTRAVENOUS

## 2017-09-13 MED ORDER — ARTIFICIAL TEARS OPHTHALMIC OINT
TOPICAL_OINTMENT | OPHTHALMIC | Status: DC | PRN
  Administered 2017-09-13: 1 via OPHTHALMIC

## 2017-09-13 MED ORDER — SODIUM CHLORIDE 0.9 % IV SOLN: 250.0000 mL | INTRAVENOUS | Status: DC

## 2017-09-13 MED ORDER — EPINEPHRINE PF 1 MG/10ML IJ SOSY
PREFILLED_SYRINGE | INTRAMUSCULAR | Status: AC
  Filled 2017-09-13: qty 10

## 2017-09-13 MED ORDER — SODIUM BICARBONATE 8.4 % IV SOLN
100.0000 meq | Freq: Once | INTRAVENOUS | Status: AC
  Administered 2017-09-13: 100 meq via INTRAVENOUS

## 2017-09-13 MED ORDER — CHLORHEXIDINE GLUCONATE 0.12 % MT SOLN
15.0000 mL | OROMUCOSAL | Status: AC
  Administered 2017-09-13: 15 mL via OROMUCOSAL

## 2017-09-13 MED ORDER — PROPOFOL 10 MG/ML IV BOLUS
INTRAVENOUS | Status: DC | PRN
  Administered 2017-09-13: 60 mg via INTRAVENOUS

## 2017-09-13 MED ORDER — MIDAZOLAM HCL 2 MG/2ML IJ SOLN
2.0000 mg | INTRAMUSCULAR | Status: DC | PRN
  Administered 2017-09-15: 2 mg via INTRAVENOUS
  Filled 2017-09-13: qty 2

## 2017-09-13 MED ORDER — PANTOPRAZOLE SODIUM 40 MG PO TBEC
40.0000 mg | DELAYED_RELEASE_TABLET | Freq: Every day | ORAL | Status: DC
  Administered 2017-09-15 – 2017-09-21 (×7): 40 mg via ORAL
  Filled 2017-09-13 (×8): qty 1

## 2017-09-13 MED ORDER — SODIUM CHLORIDE 0.9 % IV SOLN
0.0000 ug/min | INTRAVENOUS | Status: DC
  Filled 2017-09-13 (×2): qty 2

## 2017-09-13 MED ORDER — VANCOMYCIN HCL IN DEXTROSE 1-5 GM/200ML-% IV SOLN
1000.0000 mg | Freq: Once | INTRAVENOUS | Status: AC
  Administered 2017-09-13: 1000 mg via INTRAVENOUS
  Filled 2017-09-13: qty 200

## 2017-09-13 MED ORDER — PROTAMINE SULFATE 10 MG/ML IV SOLN
INTRAVENOUS | Status: DC | PRN
  Administered 2017-09-13: 350 mg via INTRAVENOUS

## 2017-09-13 MED ORDER — DEXTROSE 5 % IV SOLN
1.5000 g | INTRAVENOUS | Status: AC
  Administered 2017-09-14 – 2017-09-15 (×2): 1.5 g via INTRAVENOUS
  Filled 2017-09-13 (×2): qty 1.5

## 2017-09-13 MED ORDER — SODIUM BICARBONATE 8.4 % IV SOLN
50.0000 meq | Freq: Once | INTRAVENOUS | Status: AC
  Administered 2017-09-13: 50 meq via INTRAVENOUS

## 2017-09-13 MED ORDER — SODIUM CHLORIDE 0.9 % IV SOLN
0.0000 ug/kg/h | INTRAVENOUS | Status: DC
  Administered 2017-09-13: 0.4 ug/kg/h via INTRAVENOUS
  Filled 2017-09-13: qty 2

## 2017-09-13 MED ORDER — NOREPINEPHRINE BITARTRATE 1 MG/ML IV SOLN
0.0000 ug/min | INTRAVENOUS | Status: DC
  Administered 2017-09-13: 17 ug/min via INTRAVENOUS
  Administered 2017-09-14: 13 ug/min via INTRAVENOUS
  Administered 2017-09-15: 4 ug/min via INTRAVENOUS
  Administered 2017-09-16: 12 ug/min via INTRAVENOUS
  Administered 2017-09-16: 20 ug/min via INTRAVENOUS
  Administered 2017-09-16: 9.013 ug/min via INTRAVENOUS
  Filled 2017-09-13 (×10): qty 4

## 2017-09-13 MED ORDER — AMIODARONE HCL IN DEXTROSE 360-4.14 MG/200ML-% IV SOLN
INTRAVENOUS | Status: AC
  Filled 2017-09-13: qty 200

## 2017-09-13 MED ORDER — SODIUM CHLORIDE 0.9% FLUSH: 3.0000 mL | INTRAVENOUS | Status: DC | PRN

## 2017-09-13 MED ORDER — MIDAZOLAM HCL 10 MG/2ML IJ SOLN
INTRAMUSCULAR | Status: AC
  Filled 2017-09-13: qty 2

## 2017-09-13 MED ORDER — SODIUM BICARBONATE 8.4 % IV SOLN
INTRAVENOUS | Status: AC
Start: 2017-09-13 — End: 2017-09-13
  Filled 2017-09-13: qty 50

## 2017-09-13 MED ORDER — DEXTROSE 5 % IV SOLN: 1.5000 g | INTRAVENOUS | Status: DC

## 2017-09-13 MED ORDER — FENTANYL CITRATE (PF) 250 MCG/5ML IJ SOLN
INTRAMUSCULAR | Status: AC
  Filled 2017-09-13: qty 30

## 2017-09-13 MED ORDER — FAMOTIDINE IN NACL 20-0.9 MG/50ML-% IV SOLN
20.0000 mg | Freq: Two times a day (BID) | INTRAVENOUS | Status: AC
  Administered 2017-09-13 (×2): 20 mg via INTRAVENOUS
  Filled 2017-09-13: qty 50

## 2017-09-13 MED ORDER — TRAMADOL HCL 50 MG PO TABS
50.0000 mg | ORAL_TABLET | Freq: Two times a day (BID) | ORAL | Status: DC | PRN
  Administered 2017-09-18 – 2017-09-19 (×2): 50 mg via ORAL
  Filled 2017-09-13 (×2): qty 1

## 2017-09-13 MED ORDER — METOPROLOL TARTRATE 12.5 MG HALF TABLET
12.5000 mg | ORAL_TABLET | Freq: Two times a day (BID) | ORAL | Status: DC
  Administered 2017-09-16 – 2017-09-19 (×2): 12.5 mg via ORAL
  Filled 2017-09-13 (×5): qty 1

## 2017-09-13 MED ORDER — METOPROLOL TARTRATE 25 MG/10 ML ORAL SUSPENSION
12.5000 mg | Freq: Two times a day (BID) | ORAL | Status: DC
  Administered 2017-09-20 – 2017-09-21 (×2): 12.5 mg
  Filled 2017-09-13 (×2): qty 5

## 2017-09-13 MED ORDER — NITROGLYCERIN IN D5W 200-5 MCG/ML-% IV SOLN: 0.0000 ug/min | INTRAVENOUS | Status: DC

## 2017-09-13 MED ORDER — DOPAMINE-DEXTROSE 3.2-5 MG/ML-% IV SOLN: 3.0000 ug/kg/min | INTRAVENOUS | Status: DC

## 2017-09-13 MED ORDER — ASPIRIN EC 325 MG PO TBEC
325.0000 mg | DELAYED_RELEASE_TABLET | Freq: Every day | ORAL | Status: DC
  Administered 2017-09-14 – 2017-09-19 (×6): 325 mg via ORAL
  Filled 2017-09-13 (×7): qty 1

## 2017-09-13 MED ORDER — ALBUMIN HUMAN 5 % IV SOLN
INTRAVENOUS | Status: DC | PRN
  Administered 2017-09-13: 14:00:00 via INTRAVENOUS

## 2017-09-13 MED ORDER — OXYCODONE HCL 5 MG PO TABS
5.0000 mg | ORAL_TABLET | ORAL | Status: DC | PRN
  Filled 2017-09-13: qty 2

## 2017-09-13 MED ORDER — SUCCINYLCHOLINE CHLORIDE 20 MG/ML IJ SOLN
INTRAMUSCULAR | Status: DC | PRN
  Administered 2017-09-13: 120 mg via INTRAVENOUS

## 2017-09-13 MED ORDER — BISACODYL 5 MG PO TBEC
10.0000 mg | DELAYED_RELEASE_TABLET | Freq: Every day | ORAL | Status: DC
  Administered 2017-09-14 – 2017-10-02 (×11): 10 mg via ORAL
  Filled 2017-09-13 (×13): qty 2

## 2017-09-13 MED ORDER — SODIUM CHLORIDE 0.9% FLUSH
3.0000 mL | Freq: Two times a day (BID) | INTRAVENOUS | Status: DC
  Administered 2017-09-14 – 2017-09-22 (×9): 3 mL via INTRAVENOUS

## 2017-09-13 MED ORDER — MAGNESIUM SULFATE 4 GM/100ML IV SOLN
4.0000 g | Freq: Once | INTRAVENOUS | Status: DC
  Filled 2017-09-13: qty 100

## 2017-09-13 MED ORDER — SODIUM CHLORIDE 0.9 % IV SOLN
INTRAVENOUS | Status: DC
  Administered 2017-09-13: 15:00:00 via INTRAVENOUS

## 2017-09-13 MED ORDER — SODIUM CHLORIDE 0.9 % IV SOLN
30.0000 ug/min | INTRAVENOUS | Status: DC
  Filled 2017-09-13: qty 2

## 2017-09-13 MED ORDER — ACETAMINOPHEN 160 MG/5ML PO SOLN: 650.0000 mg | Freq: Once | ORAL | Status: AC

## 2017-09-13 MED ORDER — MILRINONE LACTATE IN DEXTROSE 20-5 MG/100ML-% IV SOLN
0.3000 ug/kg/min | INTRAVENOUS | Status: DC
  Administered 2017-09-13 – 2017-09-16 (×6): 0.3 ug/kg/min via INTRAVENOUS
  Filled 2017-09-13 (×7): qty 100

## 2017-09-13 MED ORDER — ACETAMINOPHEN 500 MG PO TABS
1000.0000 mg | ORAL_TABLET | Freq: Four times a day (QID) | ORAL | Status: AC
  Administered 2017-09-14 – 2017-09-18 (×9): 1000 mg via ORAL
  Filled 2017-09-13 (×11): qty 2

## 2017-09-13 MED ORDER — ROCURONIUM BROMIDE 10 MG/ML (PF) SYRINGE
PREFILLED_SYRINGE | INTRAVENOUS | Status: DC | PRN
  Administered 2017-09-13 (×3): 50 mg via INTRAVENOUS

## 2017-09-13 MED ORDER — LACTATED RINGERS IV SOLN: INTRAVENOUS | Status: DC

## 2017-09-13 MED ORDER — INSULIN REGULAR BOLUS VIA INFUSION
0.0000 [IU] | Freq: Three times a day (TID) | INTRAVENOUS | Status: DC
  Administered 2017-09-14: 3 [IU] via INTRAVENOUS
  Filled 2017-09-13: qty 10

## 2017-09-13 MED ORDER — METOPROLOL TARTRATE 5 MG/5ML IV SOLN: 2.5000 mg | INTRAVENOUS | Status: DC | PRN

## 2017-09-13 MED ORDER — HEMOSTATIC AGENTS (NO CHARGE) OPTIME
TOPICAL | Status: DC | PRN
  Administered 2017-09-13: 3 via TOPICAL

## 2017-09-13 MED ORDER — LACTATED RINGERS IV SOLN: 500.0000 mL | Freq: Once | INTRAVENOUS | Status: DC | PRN

## 2017-09-13 MED ORDER — SODIUM BICARBONATE 8.4 % IV SOLN
INTRAVENOUS | Status: AC
  Filled 2017-09-13: qty 50

## 2017-09-13 MED ORDER — MORPHINE SULFATE (PF) 4 MG/ML IV SOLN
2.0000 mg | INTRAVENOUS | Status: DC | PRN
  Administered 2017-09-14 – 2017-09-15 (×2): 2 mg via INTRAVENOUS
  Administered 2017-09-15: 4 mg via INTRAVENOUS
  Administered 2017-09-16: 2 mg via INTRAVENOUS
  Administered 2017-09-17: 4 mg via INTRAVENOUS
  Administered 2017-09-17: 2 mg via INTRAVENOUS
  Administered 2017-09-18: 4 mg via INTRAVENOUS
  Filled 2017-09-13 (×7): qty 1

## 2017-09-13 SURGICAL SUPPLY — 125 items
ADAPTER CARDIO PERF ANTE/RETRO (ADAPTER) ×4 IMPLANT
ADAPTER MULTI PERFUSION 15 (ADAPTER) ×4 IMPLANT
BAG DECANTER FOR FLEXI CONT (MISCELLANEOUS) ×4 IMPLANT
BANDAGE ACE 4X5 VEL STRL LF (GAUZE/BANDAGES/DRESSINGS) ×4 IMPLANT
BANDAGE ACE 6X5 VEL STRL LF (GAUZE/BANDAGES/DRESSINGS) ×4 IMPLANT
BANDAGE ELASTIC 4 VELCRO ST LF (GAUZE/BANDAGES/DRESSINGS) ×4 IMPLANT
BANDAGE ELASTIC 6 VELCRO ST LF (GAUZE/BANDAGES/DRESSINGS) ×4 IMPLANT
BASKET HEART  (ORDER IN 25'S) (MISCELLANEOUS) ×1
BASKET HEART (ORDER IN 25'S) (MISCELLANEOUS) ×1
BASKET HEART (ORDER IN 25S) (MISCELLANEOUS) ×2 IMPLANT
BLADE STERNUM SYSTEM 6 (BLADE) ×4 IMPLANT
BLADE SURG 11 STRL SS (BLADE) ×4 IMPLANT
BLADE SURG 15 STRL LF DISP TIS (BLADE) ×2 IMPLANT
BLADE SURG 15 STRL SS (BLADE) ×2
BLOOD HAEMOCONCENTR 700 MIDI (MISCELLANEOUS) ×4 IMPLANT
BNDG GAUZE ELAST 4 BULKY (GAUZE/BANDAGES/DRESSINGS) ×4 IMPLANT
CANISTER SUCT 3000ML PPV (MISCELLANEOUS) ×4 IMPLANT
CANN PRFSN 3/8XRT ANG TPR 14 (MISCELLANEOUS) ×2
CANNULA EZ GLIDE AORTIC 21FR (CANNULA) ×4 IMPLANT
CANNULA GUNDRY RCSP 15FR (MISCELLANEOUS) ×4 IMPLANT
CANNULA PRFSN 3/8XRT ANG TPR14 (MISCELLANEOUS) ×2 IMPLANT
CANNULA SUMP PERICARDIAL (CANNULA) ×4 IMPLANT
CANNULA VEN MTL TIP RT (MISCELLANEOUS) ×2
CANNULA VRC MALB SNGL STG 36FR (MISCELLANEOUS) ×2 IMPLANT
CATH CPB KIT HENDRICKSON (MISCELLANEOUS) ×4 IMPLANT
CATH ROBINSON RED A/P 18FR (CATHETERS) ×12 IMPLANT
CATH THORACIC 36FR (CATHETERS) ×4 IMPLANT
CATH THORACIC 36FR RT ANG (CATHETERS) ×4 IMPLANT
CLIP VESOCCLUDE MED 24/CT (CLIP) IMPLANT
CLIP VESOCCLUDE SM WIDE 24/CT (CLIP) ×8 IMPLANT
CONN 1/2X1/2X1/2  BEN (MISCELLANEOUS) ×2
CONN 1/2X1/2X1/2 BEN (MISCELLANEOUS) ×2 IMPLANT
CONN 3/8X1/2 ST GISH (MISCELLANEOUS) ×8 IMPLANT
CONT SPEC 4OZ CLIKSEAL STRL BL (MISCELLANEOUS) ×4 IMPLANT
CRADLE DONUT ADULT HEAD (MISCELLANEOUS) ×4 IMPLANT
DRAPE CARDIOVASCULAR INCISE (DRAPES) ×2
DRAPE SLUSH/WARMER DISC (DRAPES) ×4 IMPLANT
DRAPE SRG 135X102X78XABS (DRAPES) ×2 IMPLANT
DRSG COVADERM 4X14 (GAUZE/BANDAGES/DRESSINGS) ×4 IMPLANT
ELECT REM PT RETURN 9FT ADLT (ELECTROSURGICAL) ×8
ELECTRODE REM PT RTRN 9FT ADLT (ELECTROSURGICAL) ×4 IMPLANT
FELT TEFLON 1X6 (MISCELLANEOUS) ×8 IMPLANT
GAUZE SPONGE 4X4 12PLY STRL (GAUZE/BANDAGES/DRESSINGS) ×8 IMPLANT
GAUZE SPONGE 4X4 12PLY STRL LF (GAUZE/BANDAGES/DRESSINGS) ×8 IMPLANT
GLOVE BIO SURGEON STRL SZ8 (GLOVE) ×4 IMPLANT
GLOVE SURG SIGNA 7.5 PF LTX (GLOVE) ×12 IMPLANT
GOWN STRL REUS W/ TWL LRG LVL3 (GOWN DISPOSABLE) ×8 IMPLANT
GOWN STRL REUS W/ TWL XL LVL3 (GOWN DISPOSABLE) ×4 IMPLANT
GOWN STRL REUS W/TWL LRG LVL3 (GOWN DISPOSABLE) ×8
GOWN STRL REUS W/TWL XL LVL3 (GOWN DISPOSABLE) ×4
HEMOSTAT POWDER SURGIFOAM 1G (HEMOSTASIS) ×12 IMPLANT
HEMOSTAT SURGICEL 2X14 (HEMOSTASIS) ×4 IMPLANT
INSERT FOGARTY XLG (MISCELLANEOUS) ×4 IMPLANT
KIT BASIN OR (CUSTOM PROCEDURE TRAY) ×4 IMPLANT
KIT ROOM TURNOVER OR (KITS) ×4 IMPLANT
KIT SUCTION CATH 14FR (SUCTIONS) ×8 IMPLANT
KIT VASOVIEW HEMOPRO VH 3000 (KITS) ×4 IMPLANT
LINE EXTENSION DELIVERY (MISCELLANEOUS) ×4 IMPLANT
LINE VENT (MISCELLANEOUS) ×4 IMPLANT
LOOP VESSEL SUPERMAXI WHITE (MISCELLANEOUS) ×4 IMPLANT
MARKER GRAFT CORONARY BYPASS (MISCELLANEOUS) ×12 IMPLANT
NS IRRIG 1000ML POUR BTL (IV SOLUTION) ×20 IMPLANT
PACK E OPEN HEART (SUTURE) ×4 IMPLANT
PACK OPEN HEART (CUSTOM PROCEDURE TRAY) ×4 IMPLANT
PAD ARMBOARD 7.5X6 YLW CONV (MISCELLANEOUS) ×8 IMPLANT
PAD ELECT DEFIB RADIOL ZOLL (MISCELLANEOUS) ×4 IMPLANT
PENCIL BUTTON HOLSTER BLD 10FT (ELECTRODE) ×4 IMPLANT
PUNCH AORTIC ROTATE  4.5MM 8IN (MISCELLANEOUS) ×4 IMPLANT
PUNCH AORTIC ROTATE 4.0MM (MISCELLANEOUS) IMPLANT
PUNCH AORTIC ROTATE 4.5MM 8IN (MISCELLANEOUS) IMPLANT
PUNCH AORTIC ROTATE 5MM 8IN (MISCELLANEOUS) IMPLANT
RING ANLPLS CARP-EDW PHY II 32 (Prosthesis & Implant Heart) ×2 IMPLANT
RING ANNULOPLASTY PHY II (Prosthesis & Implant Heart) ×2 IMPLANT
SET CARDIOPLEGIA MPS 5001102 (MISCELLANEOUS) ×4 IMPLANT
SUT BONE WAX W31G (SUTURE) ×4 IMPLANT
SUT ETHIBOND (SUTURE) ×8 IMPLANT
SUT ETHIBOND 2 0 SH (SUTURE) ×8
SUT ETHIBOND 2 0 SH 36X2 (SUTURE) ×8 IMPLANT
SUT ETHIBOND 2-0 RB-1 WHT (SUTURE) ×8 IMPLANT
SUT MNCRL AB 4-0 PS2 18 (SUTURE) ×4 IMPLANT
SUT PROLENE 3 0 SH DA (SUTURE) ×4 IMPLANT
SUT PROLENE 4 0 RB 1 (SUTURE) ×8
SUT PROLENE 4 0 SH DA (SUTURE) ×4 IMPLANT
SUT PROLENE 4-0 RB1 .5 CRCL 36 (SUTURE) ×8 IMPLANT
SUT PROLENE 5 0 C 1 36 (SUTURE) ×12 IMPLANT
SUT PROLENE 6 0 C 1 30 (SUTURE) ×16 IMPLANT
SUT PROLENE 7 0 BV 1 (SUTURE) ×8 IMPLANT
SUT PROLENE 7 0 BV1 MDA (SUTURE) ×4 IMPLANT
SUT PROLENE 8 0 BV175 6 (SUTURE) ×12 IMPLANT
SUT SILK  1 MH (SUTURE) ×6
SUT SILK 1 MH (SUTURE) ×6 IMPLANT
SUT SILK 1 TIES 10X30 (SUTURE) ×4 IMPLANT
SUT SILK 2 0 SH CR/8 (SUTURE) ×8 IMPLANT
SUT SILK 2 0 TIES 10X30 (SUTURE) ×4 IMPLANT
SUT SILK 2 0 TIES 17X18 (SUTURE) ×2
SUT SILK 2-0 18XBRD TIE BLK (SUTURE) ×2 IMPLANT
SUT SILK 3 0 SH CR/8 (SUTURE) ×4 IMPLANT
SUT SILK 4 0 TIE 10X30 (SUTURE) ×8 IMPLANT
SUT STEEL 6MS V (SUTURE) ×8 IMPLANT
SUT STEEL STERNAL CCS#1 18IN (SUTURE) IMPLANT
SUT STEEL SZ 6 DBL 3X14 BALL (SUTURE) ×4 IMPLANT
SUT TEM PAC WIRE 2 0 SH (SUTURE) ×16 IMPLANT
SUT VIC AB 1 CTX 36 (SUTURE) ×4
SUT VIC AB 1 CTX36XBRD ANBCTR (SUTURE) ×4 IMPLANT
SUT VIC AB 2-0 CT1 27 (SUTURE) ×2
SUT VIC AB 2-0 CT1 TAPERPNT 27 (SUTURE) ×2 IMPLANT
SUT VIC AB 2-0 CTX 27 (SUTURE) ×8 IMPLANT
SUT VIC AB 3-0 SH 27 (SUTURE)
SUT VIC AB 3-0 SH 27X BRD (SUTURE) IMPLANT
SUT VIC AB 3-0 X1 27 (SUTURE) ×8 IMPLANT
SUT VICRYL 4-0 PS2 18IN ABS (SUTURE) IMPLANT
SYSTEM SAHARA CHEST DRAIN ATS (WOUND CARE) ×4 IMPLANT
TAPE CLOTH SURG 4X10 WHT LF (GAUZE/BANDAGES/DRESSINGS) ×4 IMPLANT
TAPE CLOTH SURG 6X10 WHT LF (GAUZE/BANDAGES/DRESSINGS) ×4 IMPLANT
TAPE PAPER 2X10 WHT MICROPORE (GAUZE/BANDAGES/DRESSINGS) ×4 IMPLANT
TOWEL GREEN STERILE (TOWEL DISPOSABLE) ×16 IMPLANT
TOWEL GREEN STERILE FF (TOWEL DISPOSABLE) ×8 IMPLANT
TOWEL OR 17X24 6PK STRL BLUE (TOWEL DISPOSABLE) ×8 IMPLANT
TOWEL OR 17X26 10 PK STRL BLUE (TOWEL DISPOSABLE) ×8 IMPLANT
TRAY FOLEY SILVER 16FR TEMP (SET/KITS/TRAYS/PACK) ×4 IMPLANT
TUBE FEEDING 8FR 16IN STR KANG (MISCELLANEOUS) ×4 IMPLANT
TUBING INSUFFLATION (TUBING) ×4 IMPLANT
UNDERPAD 30X30 (UNDERPADS AND DIAPERS) ×4 IMPLANT
VRC MALLEABLE SINGLE STG 36FR (MISCELLANEOUS) ×4
WATER STERILE IRR 1000ML POUR (IV SOLUTION) ×8 IMPLANT

## 2017-09-13 NOTE — Transfer of Care (Signed)
Immediate Anesthesia Transfer of Care Note  Patient: Ernest [name]  Procedure(s) Performed: CORONARY ARTERY BYPASS GRAFTING (CABG) times four LIMA  to LAD, SVG sequentially to OM and RAMUS Intermediate and SVG to Acute Marginal (N/A Chest) TRANSESOPHAGEAL ECHOCARDIOGRAM (TEE) (N/A Esophagus) MITRAL VALVE REPAIR (MVR) (N/A Chest)  Patient Location: SICU  Anesthesia Type:General  Level of Consciousness: sedated, unresponsive, patient cooperative and Patient remains intubated per anesthesia plan  Airway & Oxygen Therapy: Patient remains intubated per anesthesia plan and Patient placed on Ventilator (see vital sign flow sheet for setting)  Post-op Assessment: Report given to RN and Post -op Vital signs reviewed and stable  Post vital signs: Reviewed and stable  Last Vitals:  Vitals:   09/13/17 0400 09/13/17 0500  BP: (!) 144/65 (!) 149/47  Pulse: 82 82  Resp: 20 20  Temp:    SpO2: (!) 89% 93%    Last Pain:  Vitals:   09/13/17 0400  TempSrc:   PainSc: 0-No pain      Patients Stated Pain Goal: 0 (11/88/67 7373)  Complications: No apparent anesthesia complications

## 2017-09-13 NOTE — Progress Notes (Signed)
On my arrival 19:31 patient was already weaning on pressure support ventilation tolerating it well. RR and minute ventilation is stable. No apparent complications or distress noted from patient at this time.

## 2017-09-13 NOTE — Anesthesia Procedure Notes (Signed)
Procedure Name: Intubation Date/Time: 09/13/2017 7:52 AM Performed by: Lance Coon, CRNA Pre-anesthesia Checklist: Patient identified, Emergency Drugs available, Suction available, Patient being monitored and Timeout performed Patient Re-evaluated:Patient Re-evaluated prior to induction Oxygen Delivery Method: Circle system utilized Preoxygenation: Pre-oxygenation with 100% oxygen Induction Type: IV induction Laryngoscope Size: Glidescope and 4 Grade View: Grade I Tube type: Subglottic suction tube Tube size: 8.0 mm Number of attempts: 1 Airway Equipment and Method: Stylet Placement Confirmation: ETT inserted through vocal cords under direct vision,  positive ETCO2 and breath sounds checked- equal and bilateral Secured at: 22 cm Tube secured with: Tape Dental Injury: Teeth and Oropharynx as per pre-operative assessment

## 2017-09-13 NOTE — OR Nursing (Signed)
Left upper arm fistula padded with foam when positioning. Good thrill and bruit noted post op

## 2017-09-13 NOTE — Anesthesia Preprocedure Evaluation (Addendum)
Anesthesia Evaluation  Patient identified by MRN, date of birth, ID band Patient awake    Reviewed: Allergy & Precautions, NPO status , Patient's Chart, lab work & pertinent test results  Airway Mallampati: III  TM Distance: <3 FB Neck ROM: Full    Dental no notable dental hx. (+) Edentulous Upper   Pulmonary shortness of breath, COPD, former smoker,    breath sounds clear to auscultation       Cardiovascular hypertension, + CAD, + Past MI and +CHF   Rhythm:Regular Rate:Normal     Neuro/Psych    GI/Hepatic (+) Hepatitis -, C  Endo/Other  diabetes  Renal/GU CRF, ESRF and DialysisRenal disease     Musculoskeletal   Abdominal (+) + obese,   Peds  Hematology  (+) anemia ,   Anesthesia Other Findings   Reproductive/Obstetrics                            Anesthesia Physical Anesthesia Plan  ASA: IV  Anesthesia Plan: General   Post-op Pain Management:    Induction: Intravenous  PONV Risk Score and Plan: 3 and Treatment may vary due to age or medical condition, Dexamethasone and Ondansetron  Airway Management Planned:   Additional Equipment: Arterial line, PA Cath, TEE and Ultrasound Guidance Line Placement  Intra-op Plan:   Post-operative Plan: Post-operative intubation/ventilation  Informed Consent: I have reviewed the patients History and Physical, chart, labs and discussed the procedure including the risks, benefits and alternatives for the proposed anesthesia with the patient or authorized representative who has indicated his/her understanding and acceptance.     Plan Discussed with:   Anesthesia Plan Comments:        Anesthesia Quick Evaluation

## 2017-09-13 NOTE — Progress Notes (Signed)
Patient ID: Ernest [name], male   DOB: 12/10/1945, 72 y.o.   MRN: [id number] EVENING ROUNDS NOTE :     [address]       [place] [address]             [phone]                 Day of Surgery Procedure(s) (LRB): CORONARY ARTERY BYPASS GRAFTING (CABG) times four LIMA  to LAD, SVG sequentially to OM and RAMUS Intermediate and SVG to Acute Marginal (N/A) TRANSESOPHAGEAL ECHOCARDIOGRAM (TEE) (N/A) MITRAL VALVE REPAIR (MVR) (N/A)  Total Length of Stay:  LOS: 5 days  BP (!) 85/58   Pulse 80   Temp (!) 97.2 F (36.2 C)   Resp 16   Ht 5\' 9"  (1.753 m)   Wt 209 lb 14.1 oz (95.2 kg)   SpO2 96%   BMI 30.99 kg/m   .Intake/Output      11/29 0701 - 11/30 0700 11/30 0701 - 12/01 0700   P.O. 320    I.V. (mL/kg) 448.3 (4.7) 2802.2 (29.4)   Blood  965   IV Piggyback  550   Total Intake(mL/kg) 768.3 (8.1) 4317.2 (45.3)   Urine (mL/kg/hr)  2 (0)   Other 2000    Blood  1135   Chest Tube  477   Total Output 2000 1614   Net -1231.7 +2703.2          . sodium chloride 20 mL/hr at 09/13/17 1600  . [START ON 09/14/2017] sodium chloride    . sodium chloride 20 mL/hr at 09/13/17 1600  . albumin human    . amiodarone 30 mg/hr (09/13/17 1600)  . cefUROXime (ZINACEF)  IV    . dexmedetomidine (PRECEDEX) IV infusion 0.1 mcg/kg/hr (09/13/17 1736)  . DOPamine 3 mcg/kg/min (09/13/17 1600)  . famotidine (PEPCID) IV Stopped (09/13/17 1515)  . insulin (NOVOLIN-R) infusion 4.5 Units/hr (09/13/17 1700)  . lactated ringers    . lactated ringers 20 mL/hr at 09/13/17 1600  . lactated ringers 20 mL/hr at 09/13/17 1600  . magnesium sulfate    . milrinone 0.3 mcg/kg/min (09/13/17 1600)  . nitroGLYCERIN    . norepinephrine (LEVOPHED) Adult infusion 15 mcg/min (09/13/17 1640)  . phenylephrine (NEO-SYNEPHRINE) Adult infusion    . potassium chloride    . vancomycin       Lab Results  Component Value Date   WBC 13.9 (H) 09/13/2017   HGB 12.6 (L) 09/13/2017   HCT 37.0 (L)  09/13/2017   PLT 129 (L) 09/13/2017   GLUCOSE 154 (H) 09/13/2017   CHOL 116 10/28/2016   TRIG 92 10/28/2016   HDL 48 10/28/2016   LDLCALC 50 10/28/2016   ALT 13 (L) 09/08/2017   AST 25 09/08/2017   NA 139 09/13/2017   K 4.7 09/13/2017   CL 106 09/13/2017   CREATININE 5.30 (H) 09/13/2017   BUN 27 (H) 09/13/2017   CO2 27 09/13/2017   TSH 1.064 07/12/2015   INR 1.56 09/13/2017   HGBA1C 9.1 (H) 09/08/2017   Sedated on vent Not bleeding On levo, dopamine and milrinone   Grace Isaac MD  Beeper [phone] Office [phone] 09/13/2017 5:39 PM

## 2017-09-13 NOTE — Progress Notes (Signed)
[facility] Progress Note  Subjective: Had HD yest w/ 2L off.  Had CABG today , he is intubated postop, not responsive. Sedated.   Vitals:   09/13/17 0400 09/13/17 0500 09/13/17 1450 09/13/17 1600  BP: (!) 144/65 (!) 149/47 (!) 150/52 126/70  Pulse: 82 82 80 81  Resp: 20 20 12 14   Temp:    (!) 96.8 F (36 C)  TempSrc:    Core  SpO2: (!) 89% 93% 93% 98%  Weight:      Height:        Inpatient medications: . [START ON 09/14/2017] acetaminophen  1,000 mg Oral Q6H   Or  . [START ON 09/14/2017] acetaminophen (TYLENOL) oral liquid 160 mg/5 mL  1,000 mg Per Tube Q6H  . [START ON 09/14/2017] aspirin EC  325 mg Oral Daily   Or  . [START ON 09/14/2017] aspirin  324 mg Per Tube Daily  . atorvastatin  80 mg Oral q1800  . [START ON 09/14/2017] bisacodyl  10 mg Oral Daily   Or  . [START ON 09/14/2017] bisacodyl  10 mg Rectal Daily  . calcitRIOL  0.75 mcg Oral Q M,W,F-HD  . cinacalcet  120 mg Oral Q supper  . [START ON 09/14/2017] docusate sodium  200 mg Oral Daily  . insulin regular  0-10 Units Intravenous TID WC  . levETIRAcetam  1,000 mg Oral Daily  . levETIRAcetam  500 mg Oral Q M,W,F-HD  . metoprolol tartrate  12.5 mg Oral BID   Or  . metoprolol tartrate  12.5 mg Per Tube BID  . multivitamin  1 tablet Oral QHS  . mupirocin ointment  1 application Nasal BID  . [START ON 09/15/2017] pantoprazole  40 mg Oral Daily  . sevelamer carbonate  2,400 mg Oral TID WC  . [START ON 09/14/2017] sodium chloride flush  3 mL Intravenous Q12H   . sodium chloride 20 mL/hr at 09/13/17 1600  . [START ON 09/14/2017] sodium chloride    . sodium chloride 20 mL/hr at 09/13/17 1600  . albumin human    . amiodarone 30 mg/hr (09/13/17 1600)  . cefUROXime (ZINACEF)  IV    . dexmedetomidine (PRECEDEX) IV infusion 0.3 mcg/kg/hr (09/13/17 1600)  . DOPamine 3 mcg/kg/min (09/13/17 1600)  . famotidine (PEPCID) IV Stopped (09/13/17 1515)  . insulin (NOVOLIN-R) infusion 5.2 Units/hr (09/13/17 1605)  .  lactated ringers    . lactated ringers 20 mL/hr at 09/13/17 1600  . lactated ringers 20 mL/hr at 09/13/17 1600  . magnesium sulfate    . milrinone 0.3 mcg/kg/min (09/13/17 1600)  . nitroGLYCERIN    . norepinephrine (LEVOPHED) Adult infusion 12 mcg/min (09/13/17 1600)  . phenylephrine (NEO-SYNEPHRINE) Adult infusion    . potassium chloride    . vancomycin     sodium chloride, albumin human, alteplase, heparin, lactated ringers, lidocaine (PF), lidocaine-prilocaine, metoprolol tartrate, midazolam, morphine injection, morphine injection, ondansetron (ZOFRAN) IV, oxyCODONE, pentafluoroprop-tetrafluoroeth, [START ON 09/14/2017] sodium chloride flush, traMADol  Exam: Sitting up in bed, using accessory muscles to breath No jvd Chest rales R side 1/3 up and at L base RRR no mrg ABd obese soft ntnd No LE edema LUA AVG +bruit  Dialysis: [facility] MWF 4h 41min  95.5kg   2/2 bath  LUA AVG  Hep none -venofer 50qwk -calc 0.75 w HD -home BP meds > metoprolol 25 bid      Impression: 1. Severe CAD - sp CABG today, postop sedated on the vent 2. ESRD HD MWF. K+ , lytes ok.  No indication for acute HD, will reassess daily.   3. CM EF 45-50% 4. Vol overload - usual fol excess postop CABG, wt's pending postop 5. Anemia of CKD - Hb 11's, no esa  6. MBD of CKD - sensipar 120/d, binder ? Auryxia, calcitriol w HD tiw 7. DM w mult complications 8. Hx CVA (x 5 approx)  Plan - will re-assess in am.   Kelly Splinter MD [facility] pager [phone]   09/13/2017, 4:29 PM   Recent Labs  Lab 09/10/17 1558 09/11/17 2036 09/12/17 1023 09/13/17 0227  09/13/17 1206 09/13/17 1234 09/13/17 1350  NA 132* 129* 130* 132*   < > 136 136 138  K 4.3 4.2 4.6 4.0   < > 4.5 4.9 4.7  CL 92* 92* 93* 93*   < > 102 104 106  CO2 24 23 21* 27  --   --   --   --   GLUCOSE 235* 277* 267* 141*   < > 114* 121* 166*  BUN 72* 45* 50* 28*   < > 28* 26* 27*  CREATININE 10.61* 8.24* 9.15* 6.42*   < > 6.10* 5.90*  5.30*  CALCIUM 8.0* 7.9* 8.3* 8.2*  --   --   --   --   PHOS 6.4*  --  5.5*  --   --   --   --   --    < > = values in this interval not displayed.   Recent Labs  Lab 09/08/17 0046 09/10/17 1558 09/12/17 1023  AST 25  --   --   ALT 13*  --   --   ALKPHOS 50  --   --   BILITOT 0.8  --   --   PROT 6.5  --   --   ALBUMIN 3.5 2.7* 3.0*   Recent Labs  Lab 09/08/17 0046  09/12/17 1023 09/13/17 0227  09/13/17 1230 09/13/17 1234 09/13/17 1350 09/13/17 1452  WBC 11.7*   < > 12.7* 12.4*  --   --   --   --  13.9*  NEUTROABS 9.6*  --   --   --   --   --   --   --   --   HGB 11.6*   < > 10.3* 10.2*   < > 6.2* 7.1* 9.2* 12.5*  HCT 34.8*   < > 30.3* 29.9*   < > 18.2* 21.0* 27.0* 37.0*  MCV 89.7   < > 86.3 85.9  --   --   --   --  86.0  PLT 145*   < > 254 280  --  158  --   --  129*   < > = values in this interval not displayed.   Iron/TIBC/Ferritin/ %Sat No results found for: IRON, TIBC, FERRITIN, IRONPCTSAT

## 2017-09-13 NOTE — Progress Notes (Signed)
Pt having increased runs of vtach, up to 30 beat runs at a time.  Pt remaining alert and oriented and says he "feels bad." Pads placed on pt and attached to zoll. Pt able to cough himself out when a run of vtach presented.  MD Chakravartti paged and amio gtt with bolus ordered and given.  Pt now having decreased runs of vtach as well as frequent PVCs. BP remaining stable. Will continue to closely monitor.

## 2017-09-13 NOTE — Progress Notes (Signed)
Pt is now on PSV at this time tolerating it fairly well. Pt is being encourage to take slow deep breaths. RRT/RN at bedside.

## 2017-09-13 NOTE — Brief Op Note (Signed)
09/07/2017 - 09/13/2017  12:52 PM  PATIENT:  Ernest [name]  71 y.o. male  PRE-OPERATIVE DIAGNOSIS:  1. S/p NSTEMI 2. Multivessel CAD 3. Mild mitral insufficiency  POST-OPERATIVE DIAGNOSIS: 1. S/p NSTEMI 2. Multivessel CAD 3. Moderate to severe mitral insufficiency  PROCEDURE:  TRANSESOPHAGEAL ECHOCARDIOGRAM (TEE), MEDIAN STERNOTOMY for CORONARY ARTERY BYPASS GRAFTING (CABG)   x 4 (LIMA to LAD, SVG SEQUENTIALLY to OM and RAMUS INTERMEDIATE, and SVG to ACUTE MARGINAL) with EVH from [place] and LEFT INTERNAL MAMMARY ARTERY HARVEST, and MITRAL VALVE REPAIR (MVR) (using an annuloplasty ring, size 32)  SURGEON:  Surgeon(s) and Role:    Melrose Nakayama, MD - Primary  PHYSICIAN ASSISTANT: Lars Pinks [name]  ASSISTANTS: Vernie Murders RNFA  ANESTHESIA:   general  DRAINS: Chest tubes placed in the mediastinal and pleural spaces   COUNTS CORRECT:  YES  DICTATION: .Dragon Dictation  PLAN OF CARE: Admit to inpatient   PATIENT DISPOSITION:  ICU - intubated and hemodynamically stable.   Delay start of Pharmacological VTE agent (>24hrs) due to surgical blood loss or risk of bleeding: yes  BASELINE WEIGHT: 95.2 kg

## 2017-09-13 NOTE — Anesthesia Procedure Notes (Signed)
Arterial Line Insertion Start/End11/30/2018 6:45 AM, 09/13/2017 7:05 AM Performed by: Colin Benton, CRNA, CRNA  Patient location: Pre-op. Preanesthetic checklist: patient identified, IV checked, site marked, risks and benefits discussed, surgical consent, monitors and equipment checked, pre-op evaluation, timeout performed and anesthesia consent Lidocaine 1% used for infiltration radial was placed Catheter size: 20 G Hand hygiene performed , maximum sterile barriers used  and Seldinger technique used  Attempts: 3 Procedure performed without using ultrasound guided technique. Following insertion, dressing applied and Biopatch. Post procedure assessment: normal  Patient tolerated the procedure well with no immediate complications.

## 2017-09-13 NOTE — Progress Notes (Signed)
Pulmonary Mechanics were done w/  Good effort.   ABG reveals Metabolic Acidosis Results:  NIF - 26, FVC 861ml, pt does have an audible cuff leak.

## 2017-09-13 NOTE — Progress Notes (Signed)
Attempting to wean again. Weaning initiated at this time. RN aware. RRT/RN at bedside.

## 2017-09-13 NOTE — Progress Notes (Signed)
RN d/c foley at this time due to pt not making urine. Pt is a HD pt M/W/F treatments.

## 2017-09-13 NOTE — Progress Notes (Signed)
Pt pre-extubation gas reveals:  PH 7.28 CO2 40.2 O2 82 BEB -7 HCO3 19  Patient completed mechanics FVC and NIF with success, and other parameters are OK.  MD called and made aware of ABG.  Orders given to this RN for 1 amp of  bicarb with repeat ABG in 1 hour from bicarb administration.  RN will continue to monitor patient.

## 2017-09-13 NOTE — Interval H&P Note (Signed)
History and Physical Interval Note:  09/13/2017 7:21 AM  Ernest [name]  has presented today for surgery, with the diagnosis of CAD  The various methods of treatment have been discussed with the patient and family. After consideration of risks, benefits and other options for treatment, the patient has consented to  Procedure(s): CORONARY ARTERY BYPASS GRAFTING (CABG) (N/A) TRANSESOPHAGEAL ECHOCARDIOGRAM (TEE) (N/A) as a surgical intervention .  The patient's history has been reviewed, patient examined, no change in status, stable for surgery.  I have reviewed the patient's chart and labs.  Questions were answered to the patient's satisfaction.     Melrose Nakayama

## 2017-09-13 NOTE — Progress Notes (Signed)
20:08 Weaning terminated per RT.  Patient is somnolent and drowsy. Furthermore patient is not pulling good volumes and his minute ventilation is low. Patient is unable to lift and maintain his head off the pillow. Pt does follow commands but after initial command he goes back to sleep. SATs are stable at 92-93% sustaining. Pt is hemodynamically stable at this time. CI/ABP are normal. Pt is placed back on initial vent settings SIMV w/ a Rate. RRT will continue to monitor patient status and readiness for successful weaning. RN aware

## 2017-09-14 ENCOUNTER — Inpatient Hospital Stay (HOSPITAL_COMMUNITY): Payer: Medicare Other

## 2017-09-14 LAB — CBC
HCT: 32.4 % — ABNORMAL LOW (ref 39.0–52.0)
HEMATOCRIT: 30.4 % — AB (ref 39.0–52.0)
HEMOGLOBIN: 11.1 g/dL — AB (ref 13.0–17.0)
HEMOGLOBIN: 10.5 g/dL — AB (ref 13.0–17.0)
MCH: 28.6 pg (ref 26.0–34.0)
MCH: 28.5 pg (ref 26.0–34.0)
MCHC: 34.3 g/dL (ref 30.0–36.0)
MCHC: 34.5 g/dL (ref 30.0–36.0)
MCV: 83.5 fL (ref 78.0–100.0)
MCV: 82.6 fL (ref 78.0–100.0)
Platelets: 158 10*3/uL (ref 150–400)
Platelets: 147 10*3/uL — ABNORMAL LOW (ref 150–400)
RBC: 3.88 MIL/uL — AB (ref 4.22–5.81)
RBC: 3.68 MIL/uL — AB (ref 4.22–5.81)
RDW: 15 % (ref 11.5–15.5)
RDW: 14.8 % (ref 11.5–15.5)
WBC: 11.5 10*3/uL — ABNORMAL HIGH (ref 4.0–10.5)
WBC: 11.6 10*3/uL — ABNORMAL HIGH (ref 4.0–10.5)

## 2017-09-14 LAB — POCT I-STAT 3, ART BLOOD GAS (G3+)
Acid-base deficit: 4 mmol/L — ABNORMAL HIGH (ref 0.0–2.0)
Acid-base deficit: 4 mmol/L — ABNORMAL HIGH (ref 0.0–2.0)
Acid-base deficit: 5 mmol/L — ABNORMAL HIGH (ref 0.0–2.0)
BICARBONATE: 21.9 mmol/L (ref 20.0–28.0)
Bicarbonate: 21.2 mmol/L (ref 20.0–28.0)
Bicarbonate: 21.9 mmol/L (ref 20.0–28.0)
O2 SAT: 95 %
O2 Saturation: 93 %
O2 Saturation: 95 %
PCO2 ART: 42.4 mmHg (ref 32.0–48.0)
PCO2 ART: 44.1 mmHg (ref 32.0–48.0)
PCO2 ART: 44.2 mmHg (ref 32.0–48.0)
PH ART: 7.288 — AB (ref 7.350–7.450)
PH ART: 7.303 — AB (ref 7.350–7.450)
PH ART: 7.321 — AB (ref 7.350–7.450)
PO2 ART: 75 mmHg — AB (ref 83.0–108.0)
PO2 ART: 83 mmHg (ref 83.0–108.0)
Patient temperature: 36.7
Patient temperature: 37
TCO2: 23 mmol/L (ref 22–32)
TCO2: 23 mmol/L (ref 22–32)
TCO2: 23 mmol/L (ref 22–32)
pO2, Arterial: 84 mmHg (ref 83.0–108.0)

## 2017-09-14 LAB — GLUCOSE, CAPILLARY
GLUCOSE-CAPILLARY: 107 mg/dL — AB (ref 65–99)
GLUCOSE-CAPILLARY: 112 mg/dL — AB (ref 65–99)
GLUCOSE-CAPILLARY: 119 mg/dL — AB (ref 65–99)
GLUCOSE-CAPILLARY: 125 mg/dL — AB (ref 65–99)
GLUCOSE-CAPILLARY: 131 mg/dL — AB (ref 65–99)
GLUCOSE-CAPILLARY: 152 mg/dL — AB (ref 65–99)
GLUCOSE-CAPILLARY: 158 mg/dL — AB (ref 65–99)
GLUCOSE-CAPILLARY: 86 mg/dL (ref 65–99)
GLUCOSE-CAPILLARY: 94 mg/dL (ref 65–99)
GLUCOSE-CAPILLARY: 96 mg/dL (ref 65–99)
Glucose-Capillary: 117 mg/dL — ABNORMAL HIGH (ref 65–99)
Glucose-Capillary: 114 mg/dL — ABNORMAL HIGH (ref 65–99)
Glucose-Capillary: 95 mg/dL (ref 65–99)
Glucose-Capillary: 112 mg/dL — ABNORMAL HIGH (ref 65–99)
Glucose-Capillary: 118 mg/dL — ABNORMAL HIGH (ref 65–99)

## 2017-09-14 LAB — BASIC METABOLIC PANEL
Anion gap: 10 (ref 5–15)
BUN: 29 mg/dL — AB (ref 6–20)
CHLORIDE: 103 mmol/L (ref 101–111)
CO2: 23 mmol/L (ref 22–32)
Calcium: 7.2 mg/dL — ABNORMAL LOW (ref 8.9–10.3)
Creatinine, Ser: 6.53 mg/dL — ABNORMAL HIGH (ref 0.61–1.24)
GFR calc Af Amer: 9 mL/min — ABNORMAL LOW (ref >60)
GFR calc non Af Amer: 8 mL/min — ABNORMAL LOW (ref >60)
GLUCOSE: 107 mg/dL — AB (ref 65–99)
POTASSIUM: 4.3 mmol/L (ref 3.5–5.1)
SODIUM: 136 mmol/L (ref 135–145)

## 2017-09-14 LAB — POCT I-STAT, CHEM 8
BUN: 16 mg/dL (ref 6–20)
CALCIUM ION: 0.99 mmol/L — AB (ref 1.15–1.40)
Chloride: 97 mmol/L — ABNORMAL LOW (ref 101–111)
Creatinine, Ser: 3.9 mg/dL — ABNORMAL HIGH (ref 0.61–1.24)
GLUCOSE: 125 mg/dL — AB (ref 65–99)
HCT: 34 % — ABNORMAL LOW (ref 39.0–52.0)
HEMOGLOBIN: 11.6 g/dL — AB (ref 13.0–17.0)
Potassium: 3.5 mmol/L (ref 3.5–5.1)
SODIUM: 137 mmol/L (ref 135–145)
TCO2: 26 mmol/L (ref 22–32)

## 2017-09-14 LAB — CREATININE, SERUM
CREATININE: 4.21 mg/dL — AB (ref 0.61–1.24)
GFR calc Af Amer: 15 mL/min — ABNORMAL LOW (ref >60)
GFR, EST NON AFRICAN AMERICAN: 13 mL/min — AB (ref >60)

## 2017-09-14 LAB — MAGNESIUM
MAGNESIUM: 1.9 mg/dL (ref 1.7–2.4)
Magnesium: 2.2 mg/dL (ref 1.7–2.4)

## 2017-09-14 MED ORDER — INSULIN ASPART 100 UNIT/ML ~~LOC~~ SOLN
0.0000 [IU] | SUBCUTANEOUS | Status: DC
  Administered 2017-09-14: 2 [IU] via SUBCUTANEOUS
  Administered 2017-09-15: 8 [IU] via SUBCUTANEOUS
  Administered 2017-09-15 (×2): 4 [IU] via SUBCUTANEOUS
  Administered 2017-09-15: 8 [IU] via SUBCUTANEOUS
  Administered 2017-09-15: 2 [IU] via SUBCUTANEOUS
  Administered 2017-09-15: 4 [IU] via SUBCUTANEOUS
  Administered 2017-09-16 (×2): 8 [IU] via SUBCUTANEOUS

## 2017-09-14 MED ORDER — SODIUM CHLORIDE 0.9 % IV SOLN: 100.0000 mL | INTRAVENOUS | Status: DC | PRN

## 2017-09-14 MED ORDER — LIDOCAINE HCL (PF) 1 % IJ SOLN: 5.0000 mL | INTRAMUSCULAR | Status: DC | PRN

## 2017-09-14 MED ORDER — LIDOCAINE-PRILOCAINE 2.5-2.5 % EX CREA
1.0000 "application " | TOPICAL_CREAM | CUTANEOUS | Status: DC | PRN
  Filled 2017-09-14: qty 5

## 2017-09-14 MED ORDER — INFLUENZA VAC SPLIT HIGH-DOSE 0.5 ML IM SUSY
0.5000 mL | PREFILLED_SYRINGE | INTRAMUSCULAR | Status: DC
  Filled 2017-09-14: qty 0.5

## 2017-09-14 MED ORDER — HEPARIN SODIUM (PORCINE) 1000 UNIT/ML DIALYSIS: 1000.0000 [IU] | INTRAMUSCULAR | Status: DC | PRN

## 2017-09-14 MED ORDER — PENTAFLUOROPROP-TETRAFLUOROETH EX AERO: 1.0000 "application " | INHALATION_SPRAY | CUTANEOUS | Status: DC | PRN

## 2017-09-14 MED ORDER — CHLORHEXIDINE GLUCONATE CLOTH 2 % EX PADS
6.0000 | MEDICATED_PAD | Freq: Every day | CUTANEOUS | Status: AC
  Administered 2017-09-14 – 2017-09-19 (×4): 6 via TOPICAL

## 2017-09-14 NOTE — Progress Notes (Signed)
Patient ID: Ernest [name], male   DOB: 1946-02-22, 71 y.o.   MRN: [id number] [facility] DAILY ICU PROGRESS NOTE                   [address]            [place] [address]          [phone]   1 Day Post-Op Procedure(s) (LRB): CORONARY ARTERY BYPASS GRAFTING (CABG) times four LIMA  to LAD, SVG sequentially to OM and RAMUS Intermediate and SVG to Acute Marginal (N/A) TRANSESOPHAGEAL ECHOCARDIOGRAM (TEE) (N/A) MITRAL VALVE REPAIR (MVR) (N/A)  Total Length of Stay:  LOS: 6 days   Subjective: Patient extubated last night, awake, speech is garbled but he knows he is at [facility] and can repeat his name.  Appears unchanged from history preop  Objective: Vital signs in last 24 hours: Temp:  [96.8 F (36 C)-98.8 F (37.1 C)] 98.2 F (36.8 C) (12/01 0830) Pulse Rate:  [80-95] 93 (12/01 0830) Cardiac Rhythm: Normal sinus rhythm (12/01 0745) Resp:  [10-22] 17 (12/01 0830) BP: (85-151)/(50-91) 94/53 (12/01 0830) SpO2:  [93 %-99 %] 96 % (12/01 0830) Arterial Line BP: (72-177)/(31-76) 111/33 (12/01 0700) FiO2 (%):  [32 %-50 %] 32 % (12/01 0057) Weight:  [209 lb 14.1 oz (95.2 kg)-233 lb 11 oz (106 kg)] 233 lb 11 oz (106 kg) (12/01 0500)  Filed Weights   09/12/17 1513 09/13/17 1635 09/14/17 0500  Weight: 209 lb 14.1 oz (95.2 kg) 209 lb 14.1 oz (95.2 kg) 233 lb 11 oz (106 kg)    Weight change: -6.5 oz (-2 kg)   Hemodynamic parameters for last 24 hours: PAP: (36-50)/(11-25) 39/16 CO:  [2.1 L/min-5.9 L/min] 5.7 L/min CI:  [2.1 L/min/m2-2.8 L/min/m2] 2.7 L/min/m2  Intake/Output from previous day: 11/30 0701 - 12/01 0700 In: 6080.1 [I.V.:4265.1; Blood:965; IV Piggyback:850] Out: 1694 [Urine:2; Blood:1135; Chest Tube:557]  Intake/Output this shift: Total I/O In: 60.7 [I.V.:60.7] Out: -   Current Meds: Scheduled Meds: . acetaminophen  1,000 mg Oral Q6H   Or  . acetaminophen (TYLENOL) oral liquid 160 mg/5 mL  1,000 mg Per Tube Q6H  . aspirin EC  325 mg Oral  Daily   Or  . aspirin  324 mg Per Tube Daily  . atorvastatin  80 mg Oral q1800  . bisacodyl  10 mg Oral Daily   Or  . bisacodyl  10 mg Rectal Daily  . calcitRIOL  0.75 mcg Oral Q M,W,F-HD  . chlorhexidine gluconate (MEDLINE KIT)  15 mL Mouth Rinse BID  . cinacalcet  120 mg Oral Q supper  . docusate sodium  200 mg Oral Daily  . insulin regular  0-10 Units Intravenous TID WC  . levETIRAcetam  1,000 mg Oral Daily  . levETIRAcetam  500 mg Oral Q M,W,F-HD  . mouth rinse  15 mL Mouth Rinse QID  . metoprolol tartrate  12.5 mg Oral BID   Or  . metoprolol tartrate  12.5 mg Per Tube BID  . multivitamin  1 tablet Oral QHS  . mupirocin ointment  1 application Nasal BID  . [START ON 09/15/2017] pantoprazole  40 mg Oral Daily  . sevelamer carbonate  2,400 mg Oral TID WC  . sodium chloride flush  3 mL Intravenous Q12H   Continuous Infusions: . sodium chloride 20 mL/hr at 09/14/17 0800  . sodium chloride    . sodium chloride 10 mL/hr at 09/14/17 0800  . albumin human    .  amiodarone 30 mg/hr (09/14/17 0800)  . cefUROXime (ZINACEF)  IV    . dexmedetomidine (PRECEDEX) IV infusion Stopped (09/13/17 1808)  . DOPamine 3 mcg/kg/min (09/14/17 0800)  . insulin (NOVOLIN-R) infusion 1.6 Units/hr (09/14/17 0918)  . lactated ringers    . lactated ringers 20 mL/hr at 09/13/17 1600  . lactated ringers 20 mL/hr at 09/13/17 1600  . magnesium sulfate    . milrinone 0.3 mcg/kg/min (09/14/17 0800)  . nitroGLYCERIN    . norepinephrine (LEVOPHED) Adult infusion Stopped (09/14/17 0905)  . phenylephrine (NEO-SYNEPHRINE) Adult infusion     PRN Meds:.sodium chloride, albumin human, alteplase, heparin, lactated ringers, lidocaine (PF), lidocaine-prilocaine, metoprolol tartrate, midazolam, morphine injection, ondansetron (ZOFRAN) IV, oxyCODONE, pentafluoroprop-tetrafluoroeth, sodium chloride flush, traMADol  General appearance: alert, cooperative and slowed mentation Neurologic: Speech somewhat garbled does answer  questions and follows commands moves all extremities Heart: regular rate and rhythm, S1, S2 normal, no murmur, click, rub or gallop Lungs: diminished breath sounds bibasilar Abdomen: soft, non-tender; bowel sounds normal; no masses,  no organomegaly Extremities: extremities normal, atraumatic, no cyanosis or edema and Left upper arm graft has a palpable thrill Wound: Sternum is intact  Lab Results: CBC: Recent Labs    09/13/17 2045 09/13/17 2106 09/14/17 0347  WBC 14.5*  --  11.5*  HGB 11.8* 11.9* 11.1*  HCT 35.1* 35.0* 32.4*  PLT 166  --  158   BMET:  Recent Labs    09/13/17 0227  09/13/17 2106 09/14/17 0347  NA 132*   < > 138 136  K 4.0   < > 4.4 4.3  CL 93*   < > 102 103  CO2 27  --   --  23  GLUCOSE 141*   < > 172* 107*  BUN 28*   < > 27* 29*  CREATININE 6.42*   < > 5.80* 6.53*  CALCIUM 8.2*  --   --  7.2*   < > = values in this interval not displayed.    CMET: Lab Results  Component Value Date   WBC 11.5 (H) 09/14/2017   HGB 11.1 (L) 09/14/2017   HCT 32.4 (L) 09/14/2017   PLT 158 09/14/2017   GLUCOSE 107 (H) 09/14/2017   CHOL 116 10/28/2016   TRIG 92 10/28/2016   HDL 48 10/28/2016   LDLCALC 50 10/28/2016   ALT 13 (L) 09/08/2017   AST 25 09/08/2017   NA 136 09/14/2017   K 4.3 09/14/2017   CL 103 09/14/2017   CREATININE 6.53 (H) 09/14/2017   BUN 29 (H) 09/14/2017   CO2 23 09/14/2017   TSH 1.064 07/12/2015   INR 1.56 09/13/2017   HGBA1C 9.1 (H) 09/08/2017      PT/INR:  Recent Labs    09/13/17 1452  LABPROT 18.5*  INR 1.56   Radiology: Dg Chest Port 1 View  Result Date: 09/14/2017 CLINICAL DATA:  Post CABG EXAM: PORTABLE CHEST 1 VIEW COMPARISON:  09/13/2017 FINDINGS: Interval extubation. Swan-Ganz catheter tip remains in the pulmonary outflow tract. Changes of CABG. Cardiomegaly with vascular congestion. Worsened aeration at the left base with increasing perihilar and left basilar atelectasis or edema. Mild right base atelectasis. Interstitial  prominence likely reflects edema. IMPRESSION: Interval extubation.  Increasing left basilar opacity. Mild pulmonary edema and bibasilar atelectasis. No pneumothorax. Electronically Signed   By: Rolm Baptise M.D.   On: 09/14/2017 07:48   Dg Chest Port 1 View  Result Date: 09/13/2017 CLINICAL DATA:  CABG EXAM: PORTABLE CHEST 1 VIEW COMPARISON:  09/12/2017 FINDINGS: Endotracheal  tube in good position. Bilateral chest tubes. Mitral valve replacement. Swan-Ganz catheter main pulmonary outflow tract. No pneumothorax Bilateral airspace disease similar to yesterday and most consistent with edema. Mild bibasilar atelectasis and small effusions IMPRESSION: Postop CABG and mitral valve replacement. Congestive heart failure with edema unchanged. Electronically Signed   By: Franchot Gallo M.D.   On: 09/13/2017 15:11     Assessment/Plan: S/P Procedure(s) (LRB): CORONARY ARTERY BYPASS GRAFTING (CABG) times four LIMA  to LAD, SVG sequentially to OM and RAMUS Intermediate and SVG to Acute Marginal (N/A) TRANSESOPHAGEAL ECHOCARDIOGRAM (TEE) (N/A) MITRAL VALVE REPAIR (MVR) (N/A) Mobilize Diabetes control d/c tubes/lines See progression orders Expected Acute  Blood - loss Anemia- continue to monitor  Nephrology to see consider dialysis today Continue milrinone, and dopamine and insulin, after dialysis consider weaning as appropriate   Grace Isaac 09/14/2017 9:35 AM

## 2017-09-14 NOTE — Procedures (Signed)
Extubation Procedure Note  Patient Details:   Name: Ernest [name] DOB: Jun 02, 1946 MRN: [id number]   Airway Documentation:  Airway 8 mm (Active)  Secured at (cm) 23 cm 09/14/2017 12:05 AM  Measured From Lips 09/14/2017 12:05 AM  Secured Location Right 09/14/2017 12:05 AM  Secured By Rana Snare Tape 09/14/2017 12:05 AM  Cuff Pressure (cm H2O) 26 cm H2O 09/13/2017  2:50 PM  Site Condition Dry 09/14/2017 12:05 AM    Evaluation  O2 sats: stable throughout Complications: No apparent complications Patient did tolerate procedure well. Bilateral Breath Sounds: Clear, Diminished   Yes   Extubation procedure was explained to patient. Pt nodded his head for understanding. Pt had in audible cuff leak and passed all weaning criteria and parameters. NIF - 36, FVC 1.1L. Pt was extubated to a 3L [place] w/ bubble humidity. No complications noted throughout extubation procedure. Pt is stable at this time.   Leigh Aurora, BS, RRT, RCP 09/14/2017, 12:53 AM

## 2017-09-14 NOTE — Progress Notes (Signed)
Patient ID: Ernest [name], male   DOB: November 16, 1945, 71 y.o.   MRN: [id number] EVENING ROUNDS NOTE :     [address]       [place] [address]             [phone]                 1 Day Post-Op Procedure(s) (LRB): CORONARY ARTERY BYPASS GRAFTING (CABG) times four LIMA  to LAD, SVG sequentially to OM and RAMUS Intermediate and SVG to Acute Marginal (N/A) TRANSESOPHAGEAL ECHOCARDIOGRAM (TEE) (N/A) MITRAL VALVE REPAIR (MVR) (N/A)  Total Length of Stay:  LOS: 6 days  BP 127/66 (BP Location: Right Arm)   Pulse 90   Temp (!) 97.3 F (36.3 C)   Resp 10   Ht 5\' 9"  (1.753 m)   Wt 235 lb 14.3 oz (107 kg)   SpO2 98%   BMI 34.84 kg/m   .Intake/Output      11/30 0701 - 12/01 0700 12/01 0701 - 12/02 0700   P.O.  360   I.V. (mL/kg) 4265.1 (40.2) 689.9 (6.4)   Blood 965    IV Piggyback 850 50   Total Intake(mL/kg) 6080.1 (57.4) 1099.9 (10.3)   Urine (mL/kg/hr) 2 (0)    Other  2500   Blood 1135    Chest Tube 557 60   Total Output 1694 2560   Net +4386.1 -1460.1          . sodium chloride Stopped (09/14/17 1800)  . sodium chloride    . sodium chloride 10 mL/hr at 09/14/17 1800  . sodium chloride    . sodium chloride    . amiodarone 30 mg/hr (09/14/17 1800)  . cefUROXime (ZINACEF)  IV Stopped (09/14/17 1235)  . dexmedetomidine (PRECEDEX) IV infusion Stopped (09/13/17 1808)  . DOPamine Stopped (09/14/17 1745)  . insulin (NOVOLIN-R) infusion Stopped (09/14/17 1530)  . lactated ringers    . lactated ringers 20 mL/hr at 09/13/17 1600  . lactated ringers 20 mL/hr at 09/13/17 1600  . magnesium sulfate    . milrinone 0.3 mcg/kg/min (09/14/17 1800)  . nitroGLYCERIN    . norepinephrine (LEVOPHED) Adult infusion Stopped (09/14/17 1430)  . phenylephrine (NEO-SYNEPHRINE) Adult infusion       Lab Results  Component Value Date   WBC 11.6 (H) 09/14/2017   HGB 11.6 (L) 09/14/2017   HCT 34.0 (L) 09/14/2017   PLT 147 (L) 09/14/2017   GLUCOSE 125 (H) 09/14/2017   CHOL 116 10/28/2016   TRIG 92 10/28/2016   HDL 48 10/28/2016   LDLCALC 50 10/28/2016   ALT 13 (L) 09/08/2017   AST 25 09/08/2017   NA 137 09/14/2017   K 3.5 09/14/2017   CL 97 (L) 09/14/2017   CREATININE 3.90 (H) 09/14/2017   BUN 16 09/14/2017   CO2 23 09/14/2017   TSH 1.064 07/12/2015   INR 1.56 09/13/2017   HGBA1C 9.1 (H) 09/08/2017   Up in chair, speech more slurred this evening , says he is tired  Tolerated dialysis today   Grace Isaac MD  [place] Office [phone] 09/14/2017 6:33 PM

## 2017-09-14 NOTE — Progress Notes (Signed)
Repeat ABG unremarkably different from previous.  MD made aware.  Orders given for 1 more amp of bicarb.  RN told if all other parameters met, it is OK to extubate patient.  RN will continue to monitor.  RT made aware of plan.

## 2017-09-14 NOTE — Progress Notes (Signed)
Pt is now on PSV 0026

## 2017-09-14 NOTE — Progress Notes (Signed)
[facility] Progress Note  Subjective: CXR no change mild edema. Up 11kg by wts. Extubated on [place] 3L.    Vitals:   09/14/17 0738 09/14/17 0800 09/14/17 0815 09/14/17 0830  BP:  (!) 124/56 (!) 100/50 (!) 94/53  Pulse: 93 95 94 93  Resp: '19 19 17 17  ' Temp: 98.2 F (36.8 C) 98.2 F (36.8 C) 98.2 F (36.8 C) 98.2 F (36.8 C)  TempSrc:  Core (Comment)    SpO2: 96% 96% 97% 96%  Weight:      Height:        Inpatient medications: . acetaminophen  1,000 mg Oral Q6H   Or  . acetaminophen (TYLENOL) oral liquid 160 mg/5 mL  1,000 mg Per Tube Q6H  . aspirin EC  325 mg Oral Daily   Or  . aspirin  324 mg Per Tube Daily  . atorvastatin  80 mg Oral q1800  . bisacodyl  10 mg Oral Daily   Or  . bisacodyl  10 mg Rectal Daily  . calcitRIOL  0.75 mcg Oral Q M,W,F-HD  . chlorhexidine gluconate (MEDLINE KIT)  15 mL Mouth Rinse BID  . cinacalcet  120 mg Oral Q supper  . docusate sodium  200 mg Oral Daily  . insulin regular  0-10 Units Intravenous TID WC  . levETIRAcetam  1,000 mg Oral Daily  . levETIRAcetam  500 mg Oral Q M,W,F-HD  . mouth rinse  15 mL Mouth Rinse QID  . metoprolol tartrate  12.5 mg Oral BID   Or  . metoprolol tartrate  12.5 mg Per Tube BID  . multivitamin  1 tablet Oral QHS  . mupirocin ointment  1 application Nasal BID  . [START ON 09/15/2017] pantoprazole  40 mg Oral Daily  . sevelamer carbonate  2,400 mg Oral TID WC  . sodium chloride flush  3 mL Intravenous Q12H   . sodium chloride 20 mL/hr at 09/14/17 1000  . sodium chloride    . sodium chloride 10 mL/hr at 09/14/17 1000  . albumin human    . amiodarone 30 mg/hr (09/14/17 1000)  . cefUROXime (ZINACEF)  IV    . dexmedetomidine (PRECEDEX) IV infusion Stopped (09/13/17 1808)  . DOPamine 3 mcg/kg/min (09/14/17 1000)  . insulin (NOVOLIN-R) infusion 1.6 Units/hr (09/14/17 1032)  . lactated ringers    . lactated ringers 20 mL/hr at 09/13/17 1600  . lactated ringers 20 mL/hr at 09/13/17 1600  .  magnesium sulfate    . milrinone 0.3 mcg/kg/min (09/14/17 1000)  . nitroGLYCERIN    . norepinephrine (LEVOPHED) Adult infusion Stopped (09/14/17 0905)  . phenylephrine (NEO-SYNEPHRINE) Adult infusion     sodium chloride, albumin human, alteplase, heparin, lactated ringers, lidocaine (PF), lidocaine-prilocaine, metoprolol tartrate, midazolam, morphine injection, ondansetron (ZOFRAN) IV, oxyCODONE, pentafluoroprop-tetrafluoroeth, sodium chloride flush, traMADol  Exam: Awake, responsive, mumbling incoherent, swollen face and eyes Chest bibasilar crackles, no distress RRR no mrg, sternal scar intact ABd obese soft ntnd Diffuse ext edema 2+ x 4 Confused, moves all ext LUA AVG +bruit  Dialysis: [facility] MWF 4h 66mn  95.5kg   2/2 bath  LUA AVG  Hep none -venofer 50qwk -calc 0.75 w HD -home BP meds > metoprolol 25 bid      Impression: 1. Severe CAD - sp CABG POD#1 2. ESRD HD MWF.  3. CM EF 45-50% 4. Vol overload - up 11 kg, +edema on CXR, minimal pressor support.  5. Anemia of CKD - Hb 11's, no esa  6. MBD of CKD -  holding po's for now 7. DM w mult complications 8. Hx CVA (x 5 approx)  Plan - plan HD today and tomorrow, UF 2- 2.5 L daily, get vol down gradually. Have d/w primary team.     Kelly Splinter MD [facility] pager [phone]   09/14/2017, 11:08 AM   Recent Labs  Lab 09/10/17 1558  09/12/17 1023 09/13/17 0227  09/13/17 1350 09/13/17 1459 09/13/17 2045 09/13/17 2106 09/14/17 0347  NA 132*   < > 130* 132*   < > 138 139  --  138 136  K 4.3   < > 4.6 4.0   < > 4.7 4.7  --  4.4 4.3  CL 92*   < > 93* 93*   < > 106  --   --  102 103  CO2 24   < > 21* 27  --   --   --   --   --  23  GLUCOSE 235*   < > 267* 141*   < > 166* 154*  --  172* 107*  BUN 72*   < > 50* 28*   < > 27*  --   --  27* 29*  CREATININE 10.61*   < > 9.15* 6.42*   < > 5.30*  --  6.24* 5.80* 6.53*  CALCIUM 8.0*   < > 8.3* 8.2*  --   --   --   --   --  7.2*  PHOS 6.4*  --  5.5*  --   --   --    --   --   --   --    < > = values in this interval not displayed.   Recent Labs  Lab 09/08/17 0046 09/10/17 1558 09/12/17 1023  AST 25  --   --   ALT 13*  --   --   ALKPHOS 50  --   --   BILITOT 0.8  --   --   PROT 6.5  --   --   ALBUMIN 3.5 2.7* 3.0*   Recent Labs  Lab 09/08/17 0046  09/13/17 1452  09/13/17 2045 09/13/17 2106 09/14/17 0347  WBC 11.7*   < > 13.9*  --  14.5*  --  11.5*  NEUTROABS 9.6*  --   --   --   --   --   --   HGB 11.6*   < > 12.5*   < > 11.8* 11.9* 11.1*  HCT 34.8*   < > 37.0*   < > 35.1* 35.0* 32.4*  MCV 89.7   < > 86.0  --  84.8  --  83.5  PLT 145*   < > 129*  --  166  --  158   < > = values in this interval not displayed.   Iron/TIBC/Ferritin/ %Sat No results found for: IRON, TIBC, FERRITIN, IRONPCTSAT

## 2017-09-14 NOTE — Op Note (Signed)
NAME:  Ernest [name], Ernest [name] NO.:  [id number]  MEDICAL RECORD NO.:  [id number]  LOCATION:  2H17C                        FACILITY:  [facility]  PHYSICIAN:  Revonda Standard. Roxan Hockey, M.D.DATE OF BIRTH:  07/24/1946  DATE OF PROCEDURE:  09/13/2017 DATE OF DISCHARGE:                              OPERATIVE REPORT   PREOPERATIVE DIAGNOSIS:  Severe left main coronary artery disease, status post non-ST elevation myocardial infarction.  POSTOPERATIVE DIAGNOSES: 1. Severe left main coronary artery disease, status post non-ST     elevation myocardial infarction. 2. Moderate to severe mitral regurgitation.  PROCEDURES:  Median sternotomy, extracorporeal circulation, Coronary artery bypass grafting x4  Left internal mammary artery to left anterior descending,  Sequential saphenous vein graft to ramus intermedius and obtuse marginal,  Saphenous vein graft to acute marginal, Endoscopic vein harvest from right thigh, Mitral valve repair with Edwards Physio II 32-mm annuloplasty ring (model #[id number], serial [id number]).  SURGEON:  Revonda Standard. Roxan Hockey, MD.  ASSISTANTLars Pinks, PA.  ANESTHESIA:  General.  FINDINGS:  Transesophageal echocardiography showed moderate to severe mitral regurgitation with posterior annular dilatation.  No residual mitral regurgitation post bypass.  Diffusely diseased coronaries, but good quality at the site of the anastomosis.  Saphenous vein and mammary artery good quality.  CLINICAL NOTE:  Ernest [name] is a 71 year old man with multiple medical problems including end-stage renal disease for which he is on hemodialysis.  He presented with shortness of breath and ruled in for non-ST elevation MI.  He also had acute on chronic systolic and diastolic heart failure.  Cardiac catheterization revealed a 95% distal left main stenosis as well stenosis in the acute marginal branch of the right coronary artery.  He was advised to undergo coronary artery  bypass grafting.  He had been on Plavix for multiple previous strokes and needed time to allow Plavix to wash out prior to surgery.  The indications, risks, benefits, and alternatives were discussed in detail with the patient.  He understood and accepted the risks and agreed to proceed.  He and his family did understand this was a high-risk procedure given his multiple comorbidities.  OPERATIVE NOTE:  Ernest [name] was brought to the preoperative holding area on September 13, 2017.  Anesthesia placed an arterial blood pressure monitoring line.  Multiple attempts to place a central line were unsuccessful.  He was taken to the operating room, anesthetized, and intubated.  A Foley catheter was placed.  Intravenous antibiotics were administered.  Dr. Rica Koyanagi of the Anesthesia Service placed a Swan-Ganz catheter via the right subclavian vein.  The chest, abdomen, and legs were prepped and draped in usual sterile fashion.   Transesophageal echocardiogram showed moderate to severe mitral regurgitation, which was not known preoperatively. The ecision was made to repair the mitral valve at the time of bypass.  A median sternotomy was performed and the left internal mammary artery was harvested using standard technique.  No free pleural space could be entered during the mammary takedown.  Simultaneously, an incision was made in the medial aspect of the right leg at the level of the knee. The greater saphenous vein was harvested from the thigh endoscopically. The saphenous vein was of  good quality as was the mammary artery.  2000 units of heparin were administered during the vessel harvest.  The remainder of the full heparin dose was given prior to opening of the pericardium.  A sternal retractor was placed and the pericardium was opened.  There was massive cardiomegaly.  There was a small pericardial effusion.  The ascending aorta appeared relatively normal in size and there was  no evidence of atherosclerotic disease.  After confirming adequate anticoagulation with ACT measurement, the aorta was cannulated via concentric 2-0 Ethibond pledgeted purse-string sutures.  A 31-French metal-tipped right angle venous cannula was placed via purse-string suture in the superior vena cava.  Cardiopulmonary bypass was initiated. A 36-French malleable venous cannula was placed via purse-string suture in the inferior aspect of the right atrium and directed into the inferior vena cava.  This was connected to the bypass circuit and flows were maintained per protocol.  The patient was cooled to 32 degrees Celsius.  The coronary arteries were inspected and anastomotic sites were chosen.  The conduits were inspected and prepared.  Caval tapes were placed in a Potts fashion.  A retrograde cardioplegia cannula was placed via purse-string suture in the right atrium and directed into the coronary sinus.  The catheter went into the coronary sinus easily, but would not advance past the very proximal part of this despite multiple attempts to re-position it.  I could never get it further than the first branching vein.  An antegrade cardioplegia cannula was placed in the ascending aorta.  Carbon dioxide was insufflated into the operative field.  The aorta was crossclamped.  The left ventricle was emptied via the aortic root vent.  Cardiac arrest then was achieved a combination of cold antegrade and retrograde blood cardioplegia.  An initial 500 mL of cardioplegia was administered antegrade.  There was rapid cardiac arrest, but relatively slow septal cooling.  An additional 500 mL of cardioplegia was administered retrograde.  There was septal cooling to 9 degrees Celsius.  A reversed saphenous vein graft was placed sequentially to the ramus intermedius and obtuse marginal branch of the left circumflex.  The ramus intermedius was a 2-mm intramyocardial good quality vessel.  The OM was  diffusely diseased, but it was relatively good quality at the site of the anastomosis and did accept a 1.5-mm probe proximally and distally.  A side-to-side anastomosis was performed to the ramus and an end-to-side to the OM.  Both were done with running 7-0 Prolene sutures. All anastomoses were probed proximally and distally at their completion before tying the suture.  Cardioplegia was administered down the graft. There were good flow and good hemostasis.  Additional cardioplegia was also administered via the retrograde cannula.  A reversed saphenous vein graft was placed end-to-side to the acute marginal branch of the right coronary artery.  This vein was of good quality.  It was anastomosed end-to-side with a running 7-0 Prolene suture.  The acute marginal did have some plaque both proximally and distally, but a probe did pass distally.  At the completion of anastomosis, the probe passed easily proximally and distally. Cardioplegia was administered.  There was good flow and there was good hemostasis.  After administering additional cardioplegia, the pericardium was incised.  The left mammary was anastomosed end-to-side to the distal LAD.  The LAD was a 2-mm vessel.  It was good quality at the site of anastomosis, although it was relatively thick walled.  There was plaque throughout the course of the LAD, but a probe  passed easily proximally and distally.  The mammary was anastomosed end-to-side with a running 8- 0 Prolene suture.  At the completion of the anastomosis, the bulldog clamp was briefly removed to inspect for hemostasis.  Septal rewarming was noted.  The bulldog clamp was replaced and the mammary pedicle was tacked to the epicardial surface of the heart with 6-0 Prolene sutures.  The retrograde cardioplegia cannula was repositioned back into the coronary sinus and additional cardioplegia was administered retrograde. The interatrial groove was dissected out and a left  atriotomy was performed.  The Koros attachment was placed exposing the mitral valve. The leaflets appeared relatively normal.  There was no prolapsing segment.  The anterior leaflet sized for a 32-mm Physio II annuloplasty ring.  2-0 Ethibond horizontal mattress sutures were placed circumferentially around the anulus.  A total of 13 sutures were utilized.  The commissural sutures also sized for a 32-mm annuloplasty ring.  The sutures then were placed through the sewing cuff of the annuloplasty ring.  The ring was advanced to the anulus and the sutures were sequentially tied.  The left ventricle was distended with iced saline and there was good coaptation of the anterior and posterior leaflets and no regurgitation.  Rewarming was begun.  The left atriotomy was closed in 2 layers with a running 4-0 Prolene suture.  The first was a horizontal mattress suture followed by a running simple suture.  The left atrium was de-aired prior to tying the first layer of the suture. Additional cardioplegia was administered at 20-minute intervals via the retrograde cannula during the mitral portion of the procedure.  The vein grafts were cut to length.  The proximal vein graft anastomoses were performed to 4.5-mm punch aortotomies with running 6-0 Prolene sutures.  At completion of the second proximal anastomosis, the patient was placed in Trendelenburg position.  A warm dose of retrograde cardioplegia was administered.  Lidocaine was administered.  The bulldog clamp was removed from the left mammary artery.  The aortic root was de- aired and the aortic crossclamp was removed.  The total crossclamp time was 140 minutes.  The patient spontaneously resumed a bradycardic rhythm and did not require defibrillation.  While rewarming was completed, the proximal and distal anastomoses and left atriotomy were inspected for hemostasis.  Epicardial pacing wires were placed on the right ventricle and right atrium.   The superior vena caval cannula was repositioned into the right atrium, and the inferior vena caval cannula and retrograde cannula were removed.  There was good hemostasis at both these sites.  The patient was loaded with milrinone and an infusion was initiated at 0.375 mcg/kg per minute.  A dopamine infusion was initiated at 3 mg/kg/min and a norepinephrine infusion was initiated at 5 mcg/kg/min.  When the patient rewarmed to a core temperature of 37 degrees Celsius. He was weaned from cardiopulmonary bypass on the first attempt.  He was DDD paced at 80 beats per minute. Post-bypass transesophageal echocardiography revealed no residual mitral regurgitation.  Left ventricular function appeared moderately improved. The initial cardiac index was greater than 2 L/min/m2, and the patient remained hemodynamically stable throughout the post-bypass period.  A test dose of protamine was administered and it was well tolerated. The atrial and aortic cannulae were removed.  The remainder of the protamine was administered without incident.  The chest was irrigated with warm saline.  Hemostasis was achieved.  Two mediastinal tubes were placed through separate subcostal incisions.  They were secured to skin with #1 silk sutures.  The pericardium was not closed.  The sternum was reapproximated with interrupted single and double heavy gauge stainless steel wires.  There was no hemodynamic change with sternal closure.  The pectoralis fascia, subcutaneous tissue, and skin were closed in a standard fashion.  All sponge, needle, and instrument counts were correct at the end of the procedure.  The patient was taken from the operating room to the Surgical Intensive Care Unit intubated and in critical but stable condition.    Revonda Standard Roxan Hockey, M.D.    SCH/MEDQ  D:  09/13/2017  T:  09/14/2017  Job:  [id number]

## 2017-09-14 NOTE — Progress Notes (Signed)
RT aware of the plan per MD verbal orders. Repeating the weaning process. Pt is now back on a Rate. Pt is stable at this time.

## 2017-09-15 ENCOUNTER — Encounter (HOSPITAL_COMMUNITY): Payer: Self-pay | Admitting: Thoracic Surgery (Cardiothoracic Vascular Surgery)

## 2017-09-15 ENCOUNTER — Inpatient Hospital Stay (HOSPITAL_COMMUNITY): Payer: Medicare Other

## 2017-09-15 LAB — COMPREHENSIVE METABOLIC PANEL
ALT: 23 U/L (ref 17–63)
AST: 44 U/L — ABNORMAL HIGH (ref 15–41)
Albumin: 2.5 g/dL — ABNORMAL LOW (ref 3.5–5.0)
Alkaline Phosphatase: 44 U/L (ref 38–126)
Anion gap: 13 (ref 5–15)
BUN: 22 mg/dL — ABNORMAL HIGH (ref 6–20)
CO2: 24 mmol/L (ref 22–32)
Calcium: 7.4 mg/dL — ABNORMAL LOW (ref 8.9–10.3)
Chloride: 96 mmol/L — ABNORMAL LOW (ref 101–111)
Creatinine, Ser: 5.67 mg/dL — ABNORMAL HIGH (ref 0.61–1.24)
GFR calc Af Amer: 11 mL/min — ABNORMAL LOW (ref >60)
GFR calc non Af Amer: 9 mL/min — ABNORMAL LOW (ref >60)
Glucose, Bld: 223 mg/dL — ABNORMAL HIGH (ref 65–99)
Potassium: 4.1 mmol/L (ref 3.5–5.1)
Sodium: 133 mmol/L — ABNORMAL LOW (ref 135–145)
Total Bilirubin: 0.5 mg/dL (ref 0.3–1.2)
Total Protein: 5.1 g/dL — ABNORMAL LOW (ref 6.5–8.1)

## 2017-09-15 LAB — BLOOD GAS, ARTERIAL
Acid-Base Excess: 0.4 mmol/L (ref 0.0–2.0)
Bicarbonate: 24.3 mmol/L (ref 20.0–28.0)
Drawn by: 36277
O2 Content: 3 L/min
O2 Saturation: 90.5 %
Patient temperature: 98.6
pCO2 arterial: 37.5 mmHg (ref 32.0–48.0)
pH, Arterial: 7.427 (ref 7.350–7.450)
pO2, Arterial: 59.7 mmHg — ABNORMAL LOW (ref 83.0–108.0)

## 2017-09-15 LAB — CBC
HCT: 28.8 % — ABNORMAL LOW (ref 39.0–52.0)
Hemoglobin: 9.9 g/dL — ABNORMAL LOW (ref 13.0–17.0)
MCH: 28.6 pg (ref 26.0–34.0)
MCHC: 34.4 g/dL (ref 30.0–36.0)
MCV: 83.2 fL (ref 78.0–100.0)
Platelets: 178 10*3/uL (ref 150–400)
RBC: 3.46 MIL/uL — ABNORMAL LOW (ref 4.22–5.81)
RDW: 15 % (ref 11.5–15.5)
WBC: 13.1 10*3/uL — ABNORMAL HIGH (ref 4.0–10.5)

## 2017-09-15 LAB — GLUCOSE, CAPILLARY
GLUCOSE-CAPILLARY: 191 mg/dL — AB (ref 65–99)
GLUCOSE-CAPILLARY: 198 mg/dL — AB (ref 65–99)
GLUCOSE-CAPILLARY: 217 mg/dL — AB (ref 65–99)
Glucose-Capillary: 223 mg/dL — ABNORMAL HIGH (ref 65–99)
Glucose-Capillary: 131 mg/dL — ABNORMAL HIGH (ref 65–99)
Glucose-Capillary: 192 mg/dL — ABNORMAL HIGH (ref 65–99)

## 2017-09-15 MED ORDER — CHLORHEXIDINE GLUCONATE 0.12 % MT SOLN
OROMUCOSAL | Status: AC
  Filled 2017-09-15: qty 15

## 2017-09-15 MED ORDER — LIDOCAINE HCL (PF) 1 % IJ SOLN: 5.0000 mL | INTRAMUSCULAR | Status: DC | PRN

## 2017-09-15 MED ORDER — HEPARIN SODIUM (PORCINE) 1000 UNIT/ML DIALYSIS: 1000.0000 [IU] | INTRAMUSCULAR | Status: DC | PRN

## 2017-09-15 MED ORDER — SODIUM CHLORIDE 0.9 % IV SOLN: 100.0000 mL | INTRAVENOUS | Status: DC | PRN

## 2017-09-15 MED ORDER — AMIODARONE IV BOLUS ONLY 150 MG/100ML
150.0000 mg | Freq: Once | INTRAVENOUS | Status: AC
  Administered 2017-09-15: 150 mg via INTRAVENOUS

## 2017-09-15 MED ORDER — PENTAFLUOROPROP-TETRAFLUOROETH EX AERO: 1.0000 "application " | INHALATION_SPRAY | CUTANEOUS | Status: DC | PRN

## 2017-09-15 MED ORDER — LIDOCAINE-PRILOCAINE 2.5-2.5 % EX CREA: 1.0000 "application " | TOPICAL_CREAM | CUTANEOUS | Status: DC | PRN

## 2017-09-15 MED ORDER — ALTEPLASE 2 MG IJ SOLR: 2.0000 mg | Freq: Once | INTRAMUSCULAR | Status: DC | PRN

## 2017-09-15 NOTE — Progress Notes (Signed)
At approximately 0315 patient converted to afib, MD made aware, new orders received. Pt given amiodarone 150mg  bolus. ABG also ordered. Will continue to monitor.

## 2017-09-15 NOTE — Anesthesia Postprocedure Evaluation (Signed)
Anesthesia Post Note  Patient: Ernest [name]  Procedure(s) Performed: CORONARY ARTERY BYPASS GRAFTING (CABG) times four LIMA  to LAD, SVG sequentially to OM and RAMUS Intermediate and SVG to Acute Marginal (N/A Chest) TRANSESOPHAGEAL ECHOCARDIOGRAM (TEE) (N/A Esophagus) MITRAL VALVE REPAIR (MVR) (N/A Chest)     Patient location during evaluation: SICU Anesthesia Type: General Level of consciousness: sedated and patient remains intubated per anesthesia plan Pain management: pain level controlled Vital Signs Assessment: post-procedure vital signs reviewed and stable Respiratory status: patient remains intubated per anesthesia plan Cardiovascular status: stable Postop Assessment: no apparent nausea or vomiting Anesthetic complications: no    Last Vitals:  Vitals:   09/15/17 1400 09/15/17 1500  BP: (!) 106/58 (!) 124/27  Pulse: 87 (!) 116  Resp: (!) 21 (!) 21  Temp:    SpO2: (!) 87% (!) 88%    Last Pain:  Vitals:   09/15/17 1100  TempSrc: Oral  PainSc:                  Pilar Westergaard,JAMES TERRILL

## 2017-09-15 NOTE — Progress Notes (Signed)
Patient ID: Ernest [name], male   DOB: 12/25/1945, 71 y.o.   MRN: [id number] EVENING ROUNDS NOTE :     [address]       [place] [address]             [phone]                 2 Days Post-Op Procedure(s) (LRB): CORONARY ARTERY BYPASS GRAFTING (CABG) times four LIMA  to LAD, SVG sequentially to OM and RAMUS Intermediate and SVG to Acute Marginal (N/A) TRANSESOPHAGEAL ECHOCARDIOGRAM (TEE) (N/A) MITRAL VALVE REPAIR (MVR) (N/A)  Total Length of Stay:  LOS: 7 days  BP (!) 124/27   Pulse (!) 116   Temp 98.1 F (36.7 C) (Oral)   Resp (!) 21   Ht 5\' 9"  (1.753 m)   Wt 232 lb 9.4 oz (105.5 kg)   SpO2 (!) 88%   BMI 34.35 kg/m   .Intake/Output      12/01 0701 - 12/02 0700 12/02 0701 - 12/03 0700   P.O. 360    I.V. (mL/kg) 861.7 (8.2) 633.3 (6)   Blood     IV Piggyback 50 50   Total Intake(mL/kg) 1271.7 (12.1) 683.3 (6.5)   Urine (mL/kg/hr) 0 (0)    Other 2500    Blood     Chest Tube 110 40   Total Output 2610 40   Net -1338.3 +643.3          . sodium chloride    . sodium chloride 10 mL/hr at 09/15/17 0800  . amiodarone 30 mg/hr (09/15/17 1111)  . dexmedetomidine (PRECEDEX) IV infusion Stopped (09/13/17 1808)  . DOPamine Stopped (09/14/17 1745)  . insulin (NOVOLIN-R) infusion Stopped (09/14/17 1530)  . lactated ringers    . lactated ringers 20 mL/hr at 09/13/17 1600  . lactated ringers 20 mL/hr at 09/13/17 1600  . magnesium sulfate    . milrinone 0.3 mcg/kg/min (09/15/17 0800)  . nitroGLYCERIN    . norepinephrine (LEVOPHED) Adult infusion 8 mcg/min (09/15/17 1300)  . phenylephrine (NEO-SYNEPHRINE) Adult infusion       Lab Results  Component Value Date   WBC 13.1 (H) 09/15/2017   HGB 9.9 (L) 09/15/2017   HCT 28.8 (L) 09/15/2017   PLT 178 09/15/2017   GLUCOSE 223 (H) 09/15/2017   CHOL 116 10/28/2016   TRIG 92 10/28/2016   HDL 48 10/28/2016   LDLCALC 50 10/28/2016   ALT 23 09/15/2017   AST 44 (H) 09/15/2017   NA 133 (L) 09/15/2017   K  4.1 09/15/2017   CL 96 (L) 09/15/2017   CREATININE 5.67 (H) 09/15/2017   BUN 22 (H) 09/15/2017   CO2 24 09/15/2017   TSH 1.064 07/12/2015   INR 1.56 09/13/2017   HGBA1C 9.1 (H) 09/08/2017   Remains confused, moves all extremities  Tolerated dialysis    Grace Isaac MD  Beeper [phone] Office [phone] 09/15/2017 4:57 PM

## 2017-09-15 NOTE — Progress Notes (Signed)
Patient ID: Ernest [name], male   DOB: 26-May-1946, 71 y.o.   MRN: [id number] TCTS DAILY ICU PROGRESS NOTE                   [address]            [place] [address]          [id number]   2 Days Post-Op Procedure(s) (LRB): CORONARY ARTERY BYPASS GRAFTING (CABG) times four LIMA  to LAD, SVG sequentially to OM and RAMUS Intermediate and SVG to Acute Marginal (N/A) TRANSESOPHAGEAL ECHOCARDIOGRAM (TEE) (N/A) MITRAL VALVE REPAIR (MVR) (N/A)  Total Length of Stay:  LOS: 7 days   Subjective: Remains confused, a verbal, does follow some simple commands.  Wife notes that his speech is baseline is garbled  Objective: Vital signs in last 24 hours: Temp:  [97.2 F (36.2 C)-98.7 F (37.1 C)] 97.9 F (36.6 C) (12/02 0700) Pulse Rate:  [80-109] 80 (12/02 0700) Cardiac Rhythm: Normal sinus rhythm (12/02 0400) Resp:  [8-30] 16 (12/02 0700) BP: (89-144)/(37-89) 105/81 (12/02 0700) SpO2:  [88 %-99 %] 99 % (12/02 0700) Arterial Line BP: (81-145)/(10-77) 132/43 (12/01 2000) Weight:  [232 lb 9.4 oz (105.5 kg)-242 lb 4.6 oz (109.9 kg)] 232 lb 9.4 oz (105.5 kg) (12/02 0358)  Filed Weights   09/14/17 1615 09/15/17 0000 09/15/17 0358  Weight: 235 lb 14.3 oz (107 kg) 232 lb 9.4 oz (105.5 kg) 232 lb 9.4 oz (105.5 kg)    Weight change: 32 lb 6.5 oz (14.7 kg)   Hemodynamic parameters for last 24 hours: PAP: (34-51)/(19-27) 43/22 CO:  [6.6 L/min] 6.6 L/min CI:  [3.1 L/min/m2] 3.1 L/min/m2  Intake/Output from previous day: 12/01 0701 - 12/02 0700 In: 1271.7 [P.O.:360; I.V.:861.7; IV Piggyback:50] Out: 2610 [Chest Tube:110]  Intake/Output this shift: No intake/output data recorded.  Current Meds: Scheduled Meds: . chlorhexidine      . acetaminophen  1,000 mg Oral Q6H   Or  . acetaminophen (TYLENOL) oral liquid 160 mg/5 mL  1,000 mg Per Tube Q6H  . aspirin EC  325 mg Oral Daily   Or  . aspirin  324 mg Per Tube Daily  . atorvastatin  80 mg Oral q1800  . bisacodyl  10 mg  Oral Daily   Or  . bisacodyl  10 mg Rectal Daily  . calcitRIOL  0.75 mcg Oral Q M,W,F-HD  . chlorhexidine gluconate (MEDLINE KIT)  15 mL Mouth Rinse BID  . Chlorhexidine Gluconate Cloth  6 each Topical Q0600  . cinacalcet  120 mg Oral Q supper  . docusate sodium  200 mg Oral Daily  . [START ON 09/18/2017] Influenza vac split quadrivalent PF  0.5 mL Intramuscular Tomorrow-1000  . insulin aspart  0-24 Units Subcutaneous Q4H  . insulin regular  0-10 Units Intravenous TID WC  . levETIRAcetam  1,000 mg Oral Daily  . levETIRAcetam  500 mg Oral Q M,W,F-HD  . mouth rinse  15 mL Mouth Rinse QID  . metoprolol tartrate  12.5 mg Oral BID   Or  . metoprolol tartrate  12.5 mg Per Tube BID  . multivitamin  1 tablet Oral QHS  . mupirocin ointment  1 application Nasal BID  . pantoprazole  40 mg Oral Daily  . sevelamer carbonate  2,400 mg Oral TID WC  . sodium chloride flush  3 mL Intravenous Q12H   Continuous Infusions: . sodium chloride Stopped (09/14/17 1800)  . sodium chloride    . sodium chloride 10  mL/hr at 09/14/17 1900  . sodium chloride    . sodium chloride    . sodium chloride    . sodium chloride    . amiodarone 30 mg/hr (09/15/17 0322)  . cefUROXime (ZINACEF)  IV Stopped (09/14/17 1235)  . dexmedetomidine (PRECEDEX) IV infusion Stopped (09/13/17 1808)  . DOPamine Stopped (09/14/17 1745)  . insulin (NOVOLIN-R) infusion Stopped (09/14/17 1530)  . lactated ringers    . lactated ringers 20 mL/hr at 09/13/17 1600  . lactated ringers 20 mL/hr at 09/13/17 1600  . magnesium sulfate    . milrinone 0.3 mcg/kg/min (09/15/17 0736)  . nitroGLYCERIN    . norepinephrine (LEVOPHED) Adult infusion Stopped (09/14/17 1430)  . phenylephrine (NEO-SYNEPHRINE) Adult infusion     PRN Meds:.sodium chloride, sodium chloride, sodium chloride, sodium chloride, sodium chloride, alteplase, heparin, heparin, lactated ringers, lidocaine (PF), lidocaine (PF), lidocaine-prilocaine, lidocaine-prilocaine,  metoprolol tartrate, midazolam, morphine injection, ondansetron (ZOFRAN) IV, oxyCODONE, pentafluoroprop-tetrafluoroeth, pentafluoroprop-tetrafluoroeth, sodium chloride flush, traMADol  General appearance: slowed mentation Neurologic: Follow simple commands moves all extremities speech is garbled and unintelligible Heart: regular rate and rhythm, S1, S2 normal, no murmur, click, rub or gallop Lungs: diminished breath sounds bibasilar Abdomen: soft, non-tender; bowel sounds normal; no masses,  no organomegaly Extremities: extremities normal, atraumatic, no cyanosis or edema and Homans sign is negative, no sign of DVT Wound: Sternum is stable  Lab Results: CBC: Recent Labs    09/14/17 1638 09/14/17 1645 09/15/17 0330  WBC 11.6*  --  13.1*  HGB 10.5* 11.6* 9.9*  HCT 30.4* 34.0* 28.8*  PLT 147*  --  178   BMET:  Recent Labs    09/14/17 0347  09/14/17 1645 09/15/17 0330  NA 136  --  137 133*  K 4.3  --  3.5 4.1  CL 103  --  97* 96*  CO2 23  --   --  24  GLUCOSE 107*  --  125* 223*  BUN 29*  --  16 22*  CREATININE 6.53*   < > 3.90* 5.67*  CALCIUM 7.2*  --   --  7.4*   < > = values in this interval not displayed.    CMET: Lab Results  Component Value Date   WBC 13.1 (H) 09/15/2017   HGB 9.9 (L) 09/15/2017   HCT 28.8 (L) 09/15/2017   PLT 178 09/15/2017   GLUCOSE 223 (H) 09/15/2017   CHOL 116 10/28/2016   TRIG 92 10/28/2016   HDL 48 10/28/2016   LDLCALC 50 10/28/2016   ALT 23 09/15/2017   AST 44 (H) 09/15/2017   NA 133 (L) 09/15/2017   K 4.1 09/15/2017   CL 96 (L) 09/15/2017   CREATININE 5.67 (H) 09/15/2017   BUN 22 (H) 09/15/2017   CO2 24 09/15/2017   TSH 1.064 07/12/2015   INR 1.56 09/13/2017   HGBA1C 9.1 (H) 09/08/2017      PT/INR:  Recent Labs    09/13/17 1452  LABPROT 18.5*  INR 1.56   Radiology: Dg Chest Port 1 View  Result Date: 09/15/2017 CLINICAL DATA:  Chest tube EXAM: PORTABLE CHEST 1 VIEW COMPARISON:  09/14/2017 FINDINGS: Swan-Ganz catheter  removal. Mediastinal drains remain in place. Prior CABG. Cardiomegaly with vascular congestion. Left base atelectasis with small left effusion. Slight improvement in pulmonary edema pattern. IMPRESSION: Continued left lower lobe atelectasis with left effusion. Mild edema pattern, slightly improved. Electronically Signed   By: Rolm Baptise M.D.   On: 09/15/2017 07:16     Assessment/Plan: S/P Procedure(s) (LRB): CORONARY  ARTERY BYPASS GRAFTING (CABG) times four LIMA  to LAD, SVG sequentially to OM and RAMUS Intermediate and SVG to Acute Marginal (N/A) TRANSESOPHAGEAL ECHOCARDIOGRAM (TEE) (N/A) MITRAL VALVE REPAIR (MVR) (N/A) Plan repeat dialysis today DC mediastinal tubes after dialysis Remains on low-dose milrinone, but pressure is soft will likely need to resume Levophed when dialysis started today History of numerous strokes in the past-will need follow-up CT scan of the head at some point   Grace Isaac 09/15/2017 9:05 AM

## 2017-09-15 NOTE — Progress Notes (Signed)
[facility] Progress Note  Subjective: CXR no change mild edema. Wt's down, 2.5 L off w HD yest.     Vitals:   09/15/17 0400 09/15/17 0500 09/15/17 0700 09/15/17 0918  BP: (!) 96/47 (!) 97/45 105/81 (!) 82/25  Pulse: 100 94 80 80  Resp: 17 (!) 23 16   Temp:   97.9 F (36.6 C)   TempSrc:   Oral   SpO2: 91% 97% 99%   Weight:      Height:        Inpatient medications: . chlorhexidine      . acetaminophen  1,000 mg Oral Q6H   Or  . acetaminophen (TYLENOL) oral liquid 160 mg/5 mL  1,000 mg Per Tube Q6H  . aspirin EC  325 mg Oral Daily   Or  . aspirin  324 mg Per Tube Daily  . atorvastatin  80 mg Oral q1800  . bisacodyl  10 mg Oral Daily   Or  . bisacodyl  10 mg Rectal Daily  . calcitRIOL  0.75 mcg Oral Q M,W,F-HD  . chlorhexidine gluconate (MEDLINE KIT)  15 mL Mouth Rinse BID  . Chlorhexidine Gluconate Cloth  6 each Topical Q0600  . cinacalcet  120 mg Oral Q supper  . docusate sodium  200 mg Oral Daily  . [START ON 09/18/2017] Influenza vac split quadrivalent PF  0.5 mL Intramuscular Tomorrow-1000  . insulin aspart  0-24 Units Subcutaneous Q4H  . insulin regular  0-10 Units Intravenous TID WC  . levETIRAcetam  1,000 mg Oral Daily  . levETIRAcetam  500 mg Oral Q M,W,F-HD  . mouth rinse  15 mL Mouth Rinse QID  . metoprolol tartrate  12.5 mg Oral BID   Or  . metoprolol tartrate  12.5 mg Per Tube BID  . multivitamin  1 tablet Oral QHS  . mupirocin ointment  1 application Nasal BID  . pantoprazole  40 mg Oral Daily  . sevelamer carbonate  2,400 mg Oral TID WC  . sodium chloride flush  3 mL Intravenous Q12H   . sodium chloride    . sodium chloride 10 mL/hr at 09/14/17 1900  . amiodarone 30 mg/hr (09/15/17 0800)  . cefUROXime (ZINACEF)  IV Stopped (09/14/17 1235)  . dexmedetomidine (PRECEDEX) IV infusion Stopped (09/13/17 1808)  . DOPamine Stopped (09/14/17 1745)  . insulin (NOVOLIN-R) infusion Stopped (09/14/17 1530)  . lactated ringers    . lactated  ringers 20 mL/hr at 09/13/17 1600  . lactated ringers 20 mL/hr at 09/13/17 1600  . magnesium sulfate    . milrinone 0.3 mcg/kg/min (09/15/17 0800)  . nitroGLYCERIN    . norepinephrine (LEVOPHED) Adult infusion 4 mcg/min (09/15/17 0924)  . phenylephrine (NEO-SYNEPHRINE) Adult infusion     alteplase, heparin, lactated ringers, lidocaine (PF), lidocaine-prilocaine, metoprolol tartrate, midazolam, morphine injection, ondansetron (ZOFRAN) IV, oxyCODONE, pentafluoroprop-tetrafluoroeth, sodium chloride flush, traMADol  Exam: Confused, not responding verbally Chest bibasilar crackles, no distress RRR no mrg, sternal scar intact ABd obese soft ntnd Diffuse ext edema 2+ x 4 Confused, moves all ext LUA AVG +bruit  Dialysis: [facility] MWF 4h 9mn  95.5kg   2/2 bath  LUA AVG  Hep none -venofer 50 q wk -calc 0.75 w HD -home BP meds > metoprolol 25 bid      Impression: 1. Severe CAD - sp CABG POD#2 2. AMS - multifact 3. ESRD HD MWF.  4. CM EF 45-50% 5. Vol overload - still 10kg up, mild edema on CXR 6. Hypotension - back on  low dose levo gtt for BP support; keep SBP > 90 7. Anemia of CKD - Hb down 9.9, follow 8. DM w mult complications 9. Hx CVA (x 5 approx)  Plan - plan HD today, gentle HD UF 2- 2.5L as tol. HD tomorrow , same orders  Kelly Splinter MD [facility] pager [phone]   09/15/2017, 11:03 AM   Recent Labs  Lab 09/10/17 1558  09/12/17 1023 09/13/17 0227  09/14/17 0347 09/14/17 1638 09/14/17 1645 09/15/17 0330  NA 132*   < > 130* 132*   < > 136  --  137 133*  K 4.3   < > 4.6 4.0   < > 4.3  --  3.5 4.1  CL 92*   < > 93* 93*   < > 103  --  97* 96*  CO2 24   < > 21* 27  --  23  --   --  24  GLUCOSE 235*   < > 267* 141*   < > 107*  --  125* 223*  BUN 72*   < > 50* 28*   < > 29*  --  16 22*  CREATININE 10.61*   < > 9.15* 6.42*   < > 6.53* 4.21* 3.90* 5.67*  CALCIUM 8.0*   < > 8.3* 8.2*  --  7.2*  --   --  7.4*  PHOS 6.4*  --  5.5*  --   --   --   --   --    --    < > = values in this interval not displayed.   Recent Labs  Lab 09/10/17 1558 09/12/17 1023 09/15/17 0330  AST  --   --  44*  ALT  --   --  23  ALKPHOS  --   --  44  BILITOT  --   --  0.5  PROT  --   --  5.1*  ALBUMIN 2.7* 3.0* 2.5*   Recent Labs  Lab 09/14/17 0347 09/14/17 1638 09/14/17 1645 09/15/17 0330  WBC 11.5* 11.6*  --  13.1*  HGB 11.1* 10.5* 11.6* 9.9*  HCT 32.4* 30.4* 34.0* 28.8*  MCV 83.5 82.6  --  83.2  PLT 158 147*  --  178   Iron/TIBC/Ferritin/ %Sat No results found for: IRON, TIBC, FERRITIN, IRONPCTSAT

## 2017-09-15 NOTE — Progress Notes (Signed)
PT Cancellation Note  Patient Details Name: Ernest [name] MRN: [id number] DOB: Jun 11, 1946   Cancelled Treatment:    Reason Eval/Treat Not Completed: Patient not medically ready . Per Dr Everrett Coombe note, PT to start tomorrow (10/17/16).  Patient to have HD again today.  Will return tomorrow for PT evaluation.  [name] 09/15/2017, 4:56 PM [name]. [name], [place] Pager [phone]

## 2017-09-16 ENCOUNTER — Inpatient Hospital Stay (HOSPITAL_COMMUNITY): Payer: Medicare Other

## 2017-09-16 LAB — BASIC METABOLIC PANEL
Anion gap: 13 (ref 5–15)
BUN: 31 mg/dL — ABNORMAL HIGH (ref 6–20)
CO2: 24 mmol/L (ref 22–32)
Calcium: 7.5 mg/dL — ABNORMAL LOW (ref 8.9–10.3)
Chloride: 95 mmol/L — ABNORMAL LOW (ref 101–111)
Creatinine, Ser: 7.24 mg/dL — ABNORMAL HIGH (ref 0.61–1.24)
GFR calc Af Amer: 8 mL/min — ABNORMAL LOW (ref >60)
GFR calc non Af Amer: 7 mL/min — ABNORMAL LOW (ref >60)
Glucose, Bld: 214 mg/dL — ABNORMAL HIGH (ref 65–99)
Potassium: 3.9 mmol/L (ref 3.5–5.1)
Sodium: 132 mmol/L — ABNORMAL LOW (ref 135–145)

## 2017-09-16 LAB — GLUCOSE, CAPILLARY
GLUCOSE-CAPILLARY: 85 mg/dL (ref 65–99)
GLUCOSE-CAPILLARY: 215 mg/dL — AB (ref 65–99)
Glucose-Capillary: 121 mg/dL — ABNORMAL HIGH (ref 65–99)
Glucose-Capillary: 208 mg/dL — ABNORMAL HIGH (ref 65–99)
Glucose-Capillary: 228 mg/dL — ABNORMAL HIGH (ref 65–99)
Glucose-Capillary: 96 mg/dL (ref 65–99)

## 2017-09-16 LAB — BPAM RBC
BLOOD PRODUCT EXPIRATION DATE: 201812142359
Blood Product Expiration Date: 201812132359
Blood Product Expiration Date: 201812142359
Blood Product Expiration Date: 201812142359
Blood Product Expiration Date: 201812162359
Blood Product Expiration Date: 201812172359
ISSUE DATE / TIME: 201811300819
ISSUE DATE / TIME: 201811300819
ISSUE DATE / TIME: 201811300819
ISSUE DATE / TIME: 201812020035
ISSUE DATE / TIME: 201812020258
UNIT TYPE AND RH: 7300
UNIT TYPE AND RH: 7300
UNIT TYPE AND RH: 7300
UNIT TYPE AND RH: 7300
Unit Type and Rh: 7300
Unit Type and Rh: 7300

## 2017-09-16 LAB — CBC
HCT: 28.8 % — ABNORMAL LOW (ref 39.0–52.0)
Hemoglobin: 10.1 g/dL — ABNORMAL LOW (ref 13.0–17.0)
MCH: 28.6 pg (ref 26.0–34.0)
MCHC: 35.1 g/dL (ref 30.0–36.0)
MCV: 81.6 fL (ref 78.0–100.0)
Platelets: 206 10*3/uL (ref 150–400)
RBC: 3.53 MIL/uL — ABNORMAL LOW (ref 4.22–5.81)
RDW: 14.7 % (ref 11.5–15.5)
WBC: 13.5 10*3/uL — ABNORMAL HIGH (ref 4.0–10.5)

## 2017-09-16 LAB — TYPE AND SCREEN
ABO/RH(D): B POS
ANTIBODY SCREEN: NEGATIVE
UNIT DIVISION: 0
UNIT DIVISION: 0
Unit division: 0
Unit division: 0
Unit division: 0
Unit division: 0

## 2017-09-16 LAB — COOXEMETRY PANEL
Carboxyhemoglobin: 0.8 % (ref 0.5–1.5)
Methemoglobin: 1.3 % (ref 0.0–1.5)
O2 Saturation: 70.6 %
Total hemoglobin: 10.5 g/dL — ABNORMAL LOW (ref 12.0–16.0)

## 2017-09-16 MED ORDER — INSULIN ASPART 100 UNIT/ML ~~LOC~~ SOLN
0.0000 [IU] | Freq: Three times a day (TID) | SUBCUTANEOUS | Status: DC
  Administered 2017-09-16: 2 [IU] via SUBCUTANEOUS
  Administered 2017-09-17 (×2): 5 [IU] via SUBCUTANEOUS
  Administered 2017-09-19 – 2017-09-20 (×3): 3 [IU] via SUBCUTANEOUS
  Administered 2017-09-21 (×2): 11 [IU] via SUBCUTANEOUS

## 2017-09-16 MED ORDER — MILRINONE LACTATE IN DEXTROSE 20-5 MG/100ML-% IV SOLN
0.1250 ug/kg/min | INTRAVENOUS | Status: DC
  Administered 2017-09-16: 0.32 ug/kg/min via INTRAVENOUS
  Administered 2017-09-16: 0.125 ug/kg/min via INTRAVENOUS
  Filled 2017-09-16: qty 100

## 2017-09-16 MED ORDER — RISPERIDONE 1 MG/ML PO SOLN
0.5000 mg | Freq: Every day | ORAL | Status: DC
  Administered 2017-09-18 – 2017-09-23 (×5): 0.5 mg via ORAL
  Filled 2017-09-16 (×8): qty 0.5

## 2017-09-16 MED ORDER — INSULIN ASPART 100 UNIT/ML ~~LOC~~ SOLN: 0.0000 [IU] | Freq: Every day | SUBCUTANEOUS | Status: DC

## 2017-09-16 MED ORDER — INSULIN GLARGINE 100 UNIT/ML ~~LOC~~ SOLN
10.0000 [IU] | Freq: Every day | SUBCUTANEOUS | Status: DC
  Administered 2017-09-16 – 2017-09-17 (×2): 10 [IU] via SUBCUTANEOUS
  Filled 2017-09-16 (×3): qty 0.1

## 2017-09-16 MED ORDER — DEXTROSE 5 % IV SOLN
0.0000 ug/min | INTRAVENOUS | Status: DC
  Administered 2017-09-17: 16 ug/min via INTRAVENOUS
  Administered 2017-09-17: 26 ug/min via INTRAVENOUS
  Administered 2017-09-18: 15 ug/min via INTRAVENOUS
  Filled 2017-09-16 (×3): qty 16

## 2017-09-16 MED ORDER — HALOPERIDOL LACTATE 5 MG/ML IJ SOLN
1.0000 mg | Freq: Four times a day (QID) | INTRAMUSCULAR | Status: DC | PRN
  Administered 2017-09-16 – 2017-09-20 (×4): 1 mg via INTRAVENOUS
  Filled 2017-09-16 (×4): qty 1

## 2017-09-16 MED FILL — Mannitol IV Soln 20%: INTRAVENOUS | Qty: 500 | Status: AC

## 2017-09-16 MED FILL — Heparin Sodium (Porcine) Inj 1000 Unit/ML: INTRAMUSCULAR | Qty: 20 | Status: AC

## 2017-09-16 MED FILL — Heparin Sodium (Porcine) Inj 1000 Unit/ML: INTRAMUSCULAR | Qty: 30 | Status: AC

## 2017-09-16 MED FILL — Magnesium Sulfate Inj 50%: INTRAMUSCULAR | Qty: 10 | Status: AC

## 2017-09-16 MED FILL — Sodium Chloride IV Soln 0.9%: INTRAVENOUS | Qty: 8000 | Status: AC

## 2017-09-16 MED FILL — Electrolyte-R (PH 7.4) Solution: INTRAVENOUS | Qty: 3000 | Status: AC

## 2017-09-16 MED FILL — Sodium Bicarbonate IV Soln 8.4%: INTRAVENOUS | Qty: 50 | Status: AC

## 2017-09-16 MED FILL — Potassium Chloride Inj 2 mEq/ML: INTRAVENOUS | Qty: 20 | Status: AC

## 2017-09-16 MED FILL — Albumin, Human Inj 5%: INTRAVENOUS | Qty: 250 | Status: AC

## 2017-09-16 MED FILL — Lidocaine HCl IV Inj 20 MG/ML: INTRAVENOUS | Qty: 5 | Status: AC

## 2017-09-16 NOTE — Progress Notes (Signed)
[facility] Progress Note   Assessment/ Plan:   1. Severe CAD - sp CABG 12/1 2. AMS - multifact 3. ESRD HD MWF.  Serial HD due to vol overload, will plan for HD again today 12/3.  4. CM EF 45-50% 5. Vol overload - still 10kg up, mild edema on CXR 6. Hypotension - back on low dose levo gtt for BP support; keep SBP > 90 7. Anemia of CKD - Hb down 9.9, follow 8. DM w mult complications 9. Hx CVA (x 5 approx)     Subjective:    HD completed this AM, tolerated treatment well.     Objective:   BP (!) 112/39 (BP Location: Right Arm)   Pulse 92   Temp 97.7 F (36.5 C) (Axillary)   Resp 12   Ht '5\' 9"'  (1.753 m)   Wt 102.5 kg (225 lb 15.5 oz)   SpO2 99%   BMI 33.37 kg/m   Physical Exam: GEN: Confused, lying in bed PULM: bibasilar crackles improved CV: RRR no mrg, sternal scar intact ABD obese soft ntnd EXT: LE edema improved, still upper extremity edema L > R LUA AVG +bruit    Labs: BMET Recent Labs  Lab 09/10/17 1558 09/11/17 2036 09/12/17 1023 09/13/17 0227  09/13/17 1234 09/13/17 1350 09/13/17 1459 09/13/17 2045 09/13/17 2106 09/14/17 0347 09/14/17 1638 09/14/17 1645 09/15/17 0330 09/16/17 0354  NA 132* 129* 130* 132*   < > 136 138 139  --  138 136  --  137 133* 132*  K 4.3 4.2 4.6 4.0   < > 4.9 4.7 4.7  --  4.4 4.3  --  3.5 4.1 3.9  CL 92* 92* 93* 93*   < > 104 106  --   --  102 103  --  97* 96* 95*  CO2 24 23 21* 27  --   --   --   --   --   --  23  --   --  24 24  GLUCOSE 235* 277* 267* 141*   < > 121* 166* 154*  --  172* 107*  --  125* 223* 214*  BUN 72* 45* 50* 28*   < > 26* 27*  --   --  27* 29*  --  16 22* 31*  CREATININE 10.61* 8.24* 9.15* 6.42*   < > 5.90* 5.30*  --  6.24* 5.80* 6.53* 4.21* 3.90* 5.67* 7.24*  CALCIUM 8.0* 7.9* 8.3* 8.2*  --   --   --   --   --   --  7.2*  --   --  7.4* 7.5*  PHOS 6.4*  --  5.5*  --   --   --   --   --   --   --   --   --   --   --   --    < > = values in this interval not displayed.    CBC Recent Labs  Lab 09/14/17 0347 09/14/17 1638 09/14/17 1645 09/15/17 0330 09/16/17 0354  WBC 11.5* 11.6*  --  13.1* 13.5*  HGB 11.1* 10.5* 11.6* 9.9* 10.1*  HCT 32.4* 30.4* 34.0* 28.8* 28.8*  MCV 83.5 82.6  --  83.2 81.6  PLT 158 147*  --  178 206    '@IMGRELPRIORS' @ Medications:    . acetaminophen  1,000 mg Oral Q6H   Or  . acetaminophen (TYLENOL) oral liquid 160 mg/5 mL  1,000 mg Per Tube Q6H  . aspirin EC  325 mg Oral Daily   Or  . aspirin  324 mg Per Tube Daily  . atorvastatin  80 mg Oral q1800  . bisacodyl  10 mg Oral Daily   Or  . bisacodyl  10 mg Rectal Daily  . calcitRIOL  0.75 mcg Oral Q M,W,F-HD  . chlorhexidine gluconate (MEDLINE KIT)  15 mL Mouth Rinse BID  . Chlorhexidine Gluconate Cloth  6 each Topical Q0600  . cinacalcet  120 mg Oral Q supper  . docusate sodium  200 mg Oral Daily  . [START ON 09/18/2017] Influenza vac split quadrivalent PF  0.5 mL Intramuscular Tomorrow-1000  . insulin aspart  0-15 Units Subcutaneous TID WC  . insulin aspart  0-5 Units Subcutaneous QHS  . insulin glargine  10 Units Subcutaneous Daily  . levETIRAcetam  1,000 mg Oral Daily  . levETIRAcetam  500 mg Oral Q M,W,F-HD  . mouth rinse  15 mL Mouth Rinse QID  . metoprolol tartrate  12.5 mg Oral BID   Or  . metoprolol tartrate  12.5 mg Per Tube BID  . multivitamin  1 tablet Oral QHS  . mupirocin ointment  1 application Nasal BID  . pantoprazole  40 mg Oral Daily  . sevelamer carbonate  2,400 mg Oral TID WC  . sodium chloride flush  3 mL Intravenous Q12H     Madelon Lips MD [facility] pgr [phone] 09/16/2017, 9:08 AM

## 2017-09-16 NOTE — Progress Notes (Signed)
Patient ID: NOTNAMED Ernest [name], male   DOB: September 13, 1946, 71 y.o.   MRN: [id number] TCTS Evening Rounds:  He is on milrinone 0.125 and Levo 20 mcg.  BP 115/42 MAP 55 Sinus 90 on amio  Had HD today  Sats 97%

## 2017-09-16 NOTE — Progress Notes (Signed)
Patient has been increasingly agitated throughout the night. Pt pulled off silver hydrofiber dressing and dressing over CT sites. MD made aware and gave verbal order for restraints.

## 2017-09-16 NOTE — Progress Notes (Signed)
3 Days Post-Op Procedure(s) (LRB): CORONARY ARTERY BYPASS GRAFTING (CABG) times four LIMA  to LAD, SVG sequentially to OM and RAMUS Intermediate and SVG to Acute Marginal (N/A) TRANSESOPHAGEAL ECHOCARDIOGRAM (TEE) (N/A) MITRAL VALVE REPAIR (MVR) (N/A) Subjective: Lethargic. Answers simple questions, knows he is at [place] a little slurred  Objective: Vital signs in last 24 hours: Temp:  [97.4 F (36.3 C)-98.6 F (37 C)] 97.9 F (36.6 C) (12/03 1157) Pulse Rate:  [68-121] 87 (12/03 1330) Cardiac Rhythm: Normal sinus rhythm (12/03 1200) Resp:  [0-25] 15 (12/03 1330) BP: (53-145)/(25-115) 95/33 (12/03 1330) SpO2:  [88 %-100 %] 98 % (12/03 1330) Weight:  [225 lb 15.5 oz (102.5 kg)-232 lb 2.3 oz (105.3 kg)] 225 lb 15.5 oz (102.5 kg) (12/03 0813)  Hemodynamic parameters for last 24 hours:    Intake/Output from previous day: 12/02 0701 - 12/03 0700 In: 1858.2 [P.O.:100; I.V.:1708.2; IV Piggyback:50] Out: 50 [Chest Tube:50] Intake/Output this shift: Total I/O In: 316.2 [I.V.:316.2] Out: 2500 [Other:2500]  General appearance: fidgety Neurologic: see HPI, moves all 4 with good strength, follows commands Heart: regular rate and rhythm Lungs: diminished breath sounds bibasilar Extremities: edema left UE  Lab Results: Recent Labs    09/15/17 0330 09/16/17 0354  WBC 13.1* 13.5*  HGB 9.9* 10.1*  HCT 28.8* 28.8*  PLT 178 206   BMET:  Recent Labs    09/15/17 0330 09/16/17 0354  NA 133* 132*  K 4.1 3.9  CL 96* 95*  CO2 24 24  GLUCOSE 223* 214*  BUN 22* 31*  CREATININE 5.67* 7.24*  CALCIUM 7.4* 7.5*    PT/INR:  Recent Labs    09/13/17 1452  LABPROT 18.5*  INR 1.56   ABG    Component Value Date/Time   PHART 7.427 09/15/2017 0320   HCO3 24.3 09/15/2017 0320   TCO2 26 09/14/2017 1645   ACIDBASEDEF 4.0 (H) 09/14/2017 0153   O2SAT 70.6 09/16/2017 0420   CBG (last 3)  Recent Labs    09/16/17 0505 09/16/17 0820 09/16/17 1204  GLUCAP 208* 85 96     Assessment/Plan: S/P Procedure(s) (LRB): CORONARY ARTERY BYPASS GRAFTING (CABG) times four LIMA  to LAD, SVG sequentially to OM and RAMUS Intermediate and SVG to Acute Marginal (N/A) TRANSESOPHAGEAL ECHOCARDIOGRAM (TEE) (N/A) MITRAL VALVE REPAIR (MVR) (N/A) -NEURO- toxic metabolic encephalopathy. No focal motor deficit to suggest a stroke, but not entirely out of the question. He would not be a candidate for thrombolytic therapy. Would require significant sedation for a head CT. Will hold off at this point unless there is a more focal finding  CV- stable in SR. BP down post HD  Milrinone decreased this morning  Repeat co-ox in AM  RESP- unable to copperate with IS  RENAL- HD earlier today  ENDO- CBG better controlled this AM  Nutrition- will need to consider a feeding tube if not more alert by tomorrow   LOS: 8 days    Ernest [name] 09/16/2017

## 2017-09-16 NOTE — Progress Notes (Signed)
OT Cancellation Note  Patient Details Name: Ernest [name] MRN: [id number] DOB: Apr 27, 1946   Cancelled Treatment:    Reason Eval/Treat Not Completed: Patient not medically ready: Pt currently flat on CVVHD. Will hold OT evaluation at this time.   [name], MS OTR/L  Pager: Killen A Nichalas Coin 09/16/2017, 8:01 AM

## 2017-09-16 NOTE — Progress Notes (Signed)
Arrived to patient room 2H-17 at 0430.  Reviewed treatment plan and this RN agrees.  Report received from bedside RN, Minette Brine.  Consent verified.  Patient alert, d/o X 4. Lung sounds clear and diminished to ausculation in all fields. RUE 1+ pitting edema and LUE 3+ pitting edema. Cardiac: NSR.  Prepped LUAVG with alcohol and cannulated with two 15 gauge needles.  Pulsation of blood noted.  Flushed access well with saline per protocol.  Connected and secured lines and initiated tx at [place].  UF goal of 3000 mL and net fluid removal of 2500 mL.  Will continue to monitor.

## 2017-09-16 NOTE — Progress Notes (Signed)
PT Cancellation Note  Patient Details Name: Ernest [name] MRN: [id number] DOB: Jul 12, 1946   Cancelled Treatment:    Reason Eval/Treat Not Completed: Patient not medically ready, currently flat on CVVHD. Will hold therapies at this time.   Duncan Dull 09/16/2017, 7:59 AM  Alben Deeds, PT DPT  Board Certified Neurologic Specialist [phone]

## 2017-09-16 NOTE — Progress Notes (Addendum)
Spouse at bedside concerned about patients mental status  and about Left arm swelling.

## 2017-09-17 ENCOUNTER — Inpatient Hospital Stay (HOSPITAL_COMMUNITY): Payer: Medicare Other

## 2017-09-17 LAB — GLUCOSE, CAPILLARY
GLUCOSE-CAPILLARY: 147 mg/dL — AB (ref 65–99)
GLUCOSE-CAPILLARY: 42 mg/dL — AB (ref 65–99)
Glucose-Capillary: 235 mg/dL — ABNORMAL HIGH (ref 65–99)
Glucose-Capillary: 212 mg/dL — ABNORMAL HIGH (ref 65–99)
Glucose-Capillary: 81 mg/dL (ref 65–99)
Glucose-Capillary: 97 mg/dL (ref 65–99)

## 2017-09-17 LAB — CBC
HCT: 28.9 % — ABNORMAL LOW (ref 39.0–52.0)
HEMOGLOBIN: 10 g/dL — AB (ref 13.0–17.0)
MCH: 28.3 pg (ref 26.0–34.0)
MCHC: 34.6 g/dL (ref 30.0–36.0)
MCV: 81.9 fL (ref 78.0–100.0)
PLATELETS: 218 10*3/uL (ref 150–400)
RBC: 3.53 MIL/uL — AB (ref 4.22–5.81)
RDW: 14.7 % (ref 11.5–15.5)
WBC: 14 10*3/uL — ABNORMAL HIGH (ref 4.0–10.5)

## 2017-09-17 LAB — COOXEMETRY PANEL
Carboxyhemoglobin: 1.5 % (ref 0.5–1.5)
METHEMOGLOBIN: 1.5 % (ref 0.0–1.5)
O2 Saturation: 84.4 %
Total hemoglobin: 5.3 g/dL — CL (ref 12.0–16.0)

## 2017-09-17 LAB — BASIC METABOLIC PANEL
Anion gap: 16 — ABNORMAL HIGH (ref 5–15)
BUN: 23 mg/dL — AB (ref 6–20)
CO2: 24 mmol/L (ref 22–32)
CREATININE: 6.1 mg/dL — AB (ref 0.61–1.24)
Calcium: 7.4 mg/dL — ABNORMAL LOW (ref 8.9–10.3)
Chloride: 91 mmol/L — ABNORMAL LOW (ref 101–111)
GFR calc Af Amer: 10 mL/min — ABNORMAL LOW (ref >60)
GFR, EST NON AFRICAN AMERICAN: 8 mL/min — AB (ref >60)
GLUCOSE: 247 mg/dL — AB (ref 65–99)
POTASSIUM: 4.3 mmol/L (ref 3.5–5.1)
SODIUM: 131 mmol/L — AB (ref 135–145)

## 2017-09-17 MED ORDER — ENOXAPARIN SODIUM 30 MG/0.3ML ~~LOC~~ SOLN
30.0000 mg | SUBCUTANEOUS | Status: DC
  Administered 2017-09-17 – 2017-09-19 (×3): 30 mg via SUBCUTANEOUS
  Filled 2017-09-17 (×3): qty 0.3

## 2017-09-17 MED ORDER — DEXTROSE 50 % IV SOLN
INTRAVENOUS | Status: AC
  Administered 2017-09-17: 50 mL
  Filled 2017-09-17: qty 50

## 2017-09-17 NOTE — Progress Notes (Signed)
[facility] Progress Note   Assessment/ Plan:    Dialysis: [facility] MWF 4h 45mn  95.5kg   2/2 bath  LUA AVG  Hep none -venofer 50 q wk -calc 0.75 w HD   1. Severe CAD - sp CABG 12/1.  Milrinone d/c'd today, still on a little levophed 2. AMS - multifact.  For CT head today, suspect metabolic encephalopathy 3. ESRD HD MWF.  Serial HD due to vol overload, HD today 12/4 with treatment likely again tomorrow 12/5. 4. CM EF 45-50% 5. Vol overload - still vol up, serial HD to improve vol status 6. Hypotension - back on low dose levo gtt for BP support; keep SBP > 90 7. Anemia of CKD - Hb down 10.0, follow (Co-ox shows Hgb of 5.3 taken 10 min after phlebotomy, think this is error, if high clinical suspicion can repeat CBC) 8. DM w mult complications 9. Hx CVA (x 5 approx)   Subjective:      Still encephalopathic, responds to commands but still with unintelligible speech.     Objective:   BP (!) 89/45   Pulse 64   Temp 98 F (36.7 C) (Oral)   Resp 11   Ht '5\' 9"'  (1.753 m)   Wt 104.8 kg (231 lb 0.7 oz)   SpO2 98%   BMI 34.12 kg/m   Physical Exam: GEN: Confused, lying flat in bed PULM: bibasilar crackles improved CV: RRR no mrg, sternal scar intact ABD obese soft ntnd EXT: LE edema improved, still upper extremity edema L > R LUA AVG +bruit NEURO: follows commands, can't carry on a conversation    Labs: BMET Recent Labs  Lab 09/10/17 1558 09/11/17 2036 09/12/17 1023 09/13/17 0227  09/13/17 1350 09/13/17 1459  09/13/17 2106 09/14/17 0347 09/14/17 1638 09/14/17 1645 09/15/17 0330 09/16/17 0354 09/17/17 0441  NA 132* 129* 130* 132*   < > 138 139  --  138 136  --  137 133* 132* 131*  K 4.3 4.2 4.6 4.0   < > 4.7 4.7  --  4.4 4.3  --  3.5 4.1 3.9 4.3  CL 92* 92* 93* 93*   < > 106  --   --  102 103  --  97* 96* 95* 91*  CO2 24 23 21* 27  --   --   --   --   --  23  --   --  '24 24 24  ' GLUCOSE 235* 277* 267* 141*   < > 166* 154*  --  172* 107*  --  125*  223* 214* 247*  BUN 72* 45* 50* 28*   < > 27*  --   --  27* 29*  --  16 22* 31* 23*  CREATININE 10.61* 8.24* 9.15* 6.42*   < > 5.30*  --    < > 5.80* 6.53* 4.21* 3.90* 5.67* 7.24* 6.10*  CALCIUM 8.0* 7.9* 8.3* 8.2*  --   --   --   --   --  7.2*  --   --  7.4* 7.5* 7.4*  PHOS 6.4*  --  5.5*  --   --   --   --   --   --   --   --   --   --   --   --    < > = values in this interval not displayed.   CBC Recent Labs  Lab 09/14/17 1638 09/14/17 1645 09/15/17 0330 09/16/17 0354 09/17/17 0441  WBC 11.6*  --  13.1* 13.5* 14.0*  HGB 10.5* 11.6* 9.9* 10.1* 10.0*  HCT 30.4* 34.0* 28.8* 28.8* 28.9*  MCV 82.6  --  83.2 81.6 81.9  PLT 147*  --  178 206 218    '@IMGRELPRIORS' @ Medications:    . acetaminophen  1,000 mg Oral Q6H   Or  . acetaminophen (TYLENOL) oral liquid 160 mg/5 mL  1,000 mg Per Tube Q6H  . aspirin EC  325 mg Oral Daily   Or  . aspirin  324 mg Per Tube Daily  . atorvastatin  80 mg Oral q1800  . bisacodyl  10 mg Oral Daily   Or  . bisacodyl  10 mg Rectal Daily  . calcitRIOL  0.75 mcg Oral Q M,W,F-HD  . chlorhexidine gluconate (MEDLINE KIT)  15 mL Mouth Rinse BID  . Chlorhexidine Gluconate Cloth  6 each Topical Q0600  . cinacalcet  120 mg Oral Q supper  . docusate sodium  200 mg Oral Daily  . enoxaparin (LOVENOX) injection  30 mg Subcutaneous Q24H  . [START ON 09/18/2017] Influenza vac split quadrivalent PF  0.5 mL Intramuscular Tomorrow-1000  . insulin aspart  0-15 Units Subcutaneous TID WC  . insulin aspart  0-5 Units Subcutaneous QHS  . insulin glargine  10 Units Subcutaneous Daily  . levETIRAcetam  1,000 mg Oral Daily  . levETIRAcetam  500 mg Oral Q M,W,F-HD  . mouth rinse  15 mL Mouth Rinse QID  . metoprolol tartrate  12.5 mg Oral BID   Or  . metoprolol tartrate  12.5 mg Per Tube BID  . multivitamin  1 tablet Oral QHS  . mupirocin ointment  1 application Nasal BID  . pantoprazole  40 mg Oral Daily  . risperiDONE  0.5 mg Oral QHS  . sevelamer carbonate  2,400  mg Oral TID WC  . sodium chloride flush  3 mL Intravenous Q12H     Madelon Lips MD [facility] pgr [phone] 09/17/2017, 8:44 AM

## 2017-09-17 NOTE — Progress Notes (Addendum)
OT Cancellation Note  Patient Details Name: Ernest [name] MRN: [id number] DOB: 01/20/1946   Cancelled Treatment:    Reason Eval/Treat Not Completed: Patient at procedure or test/ unavailable;Patient not medically ready. HD arrived to begin pt on dialysis on OT arrival. Also with supine BP 51/36. Will check back in AM for OT evaluation.   [name], MS OTR/L  Pager: [phone] 09/17/2017, 2:15 PM

## 2017-09-17 NOTE — Evaluation (Signed)
Physical Therapy Evaluation Patient Details Name: Ernest [name] MRN: [id number] DOB: 04-Sep-1946 Today's Date: 09/17/2017   History of Present Illness  Pt adm 11/24 with NSTEMI and underwent CABG x 4 and MVR on 08/18/17. Post op lethargy and confusion. PMH - Multiple CVA's, DM, HTN, chf, ESRD on HD, cad, copd  Clinical Impression  Pt presents to PT s/p CABG with post op lethargy. Pt able to tolerate sitting EOB for short period with BP in sitting initially 115/48 and then incr to 125/82. Pt able to demonstrate fairly good LE strength and able to bridge in bed to readjust pad. Difficult to make dc recommendation until lethargy resolves. Expect pt will make good progress when that improves.     Follow Up Recommendations Other (comment)(to be assessed when more alert)    Equipment Recommendations  Other (comment)(To be assessed)    Recommendations for Other Services       Precautions / Restrictions Precautions Precautions: Sternal;Fall Restrictions Weight Bearing Restrictions: Yes(sternal precautions) Other Position/Activity Restrictions: Sternal precautions      Mobility  Bed Mobility Overal bed mobility: Needs Assistance Bed Mobility: Supine to Sit;Sit to Supine     Supine to sit: +2 for physical assistance;Mod assist Sit to supine: +2 for physical assistance;Mod assist   General bed mobility comments: Assist to bring legs off of bed and elevate trunk into  sitting. Assist to control descent of trunk and bring legs back up into the bed on return to supine.  Transfers                 General transfer comment: Not tested due to tenous medical status  Ambulation/Gait                Stairs            Wheelchair Mobility    Modified Rankin (Stroke Patients Only)       Balance Overall balance assessment: Needs assistance Sitting-balance support: No upper extremity supported;Feet unsupported Sitting balance-Leahy Scale: Fair Sitting balance -  Comments: Sat EOB x 5 minutes with initial min assist but then supervision for safety                                     Pertinent Vitals/Pain Pain Assessment: Faces Faces Pain Scale: No hurt    Home Living Family/patient expects to be discharged to:: Private residence Living Arrangements: Spouse/significant other Available Help at Discharge: Family;Available 24 hours/day Type of Home: House Home Access: Stairs to enter Entrance Stairs-Rails: None Entrance Stairs-Number of Steps: 1 Home Layout: Two level;1/2 bath on main level;Bed/bath upstairs Home Equipment: Walker - 2 wheels;Cane - single point;Shower seat;Hand held shower head Additional Comments: Information from prior encounter    Prior Function Level of Independence: Needs assistance   Gait / Transfers Assistance Needed: amb with cane           Hand Dominance   Dominant Hand: Right    Extremity/Trunk Assessment   Upper Extremity Assessment Upper Extremity Assessment: Defer to OT evaluation    Lower Extremity Assessment Lower Extremity Assessment: Generalized weakness       Communication   Communication: Other (comment)(Pt mumbling incoherently)  Cognition Arousal/Alertness: Lethargic Behavior During Therapy: Flat affect Overall Cognitive Status: Difficult to assess  General Comments: Pt followed 1 step commands. Eyes closed. Responds verbally but unintelligible      General Comments      Exercises General Exercises - Lower Extremity Long Arc Quad: AROM;Both;5 reps;Seated Heel Slides: AAROM;Both;10 reps;Supine Hip ABduction/ADduction: AAROM;Both;10 reps;Supine   Assessment/Plan    PT Assessment Patient needs continued PT services  PT Problem List Decreased strength;Decreased activity tolerance;Decreased balance;Decreased mobility;Decreased cognition;Decreased knowledge of precautions;Obesity       PT Treatment Interventions DME  instruction;Gait training;Stair training;Functional mobility training;Therapeutic activities;Therapeutic exercise;Balance training;Cognitive remediation;Patient/family education    PT Goals (Current goals can be found in the Care Plan section)  Acute Rehab PT Goals Patient Stated Goal: Pt unable to state PT Goal Formulation: Patient unable to participate in goal setting Time For Goal Achievement: 10/01/17 Potential to Achieve Goals: Fair    Frequency Min 3X/week   Barriers to discharge Inaccessible home environment bedroom on 2nd story    Co-evaluation               AM-PAC PT "6 Clicks" Daily Activity  Outcome Measure Difficulty turning over in bed (including adjusting bedclothes, sheets and blankets)?: Unable Difficulty moving from lying on back to sitting on the side of the bed? : Unable Difficulty sitting down on and standing up from a chair with arms (e.g., wheelchair, bedside commode, etc,.)?: Unable Help needed moving to and from a bed to chair (including a wheelchair)?: Total Help needed walking in hospital room?: Total Help needed climbing 3-5 steps with a railing? : Total 6 Click Score: 6    End of Session   Activity Tolerance: Patient limited by lethargy Patient left: in bed;with call bell/phone within reach;with bed alarm set Nurse Communication: Mobility status PT Visit Diagnosis: Other abnormalities of gait and mobility (R26.89);Muscle weakness (generalized) (M62.81);Difficulty in walking, not elsewhere classified (R26.2)    Time: 1029-1050 PT Time Calculation (min) (ACUTE ONLY): 21 min   Charges:   PT Evaluation $PT Eval High Complexity: 1 High     PT G Codes:        [facility] PT [place] 09/17/2017, 12:21 PM

## 2017-09-17 NOTE — Progress Notes (Signed)
Physician requested patient be paced on AAI at a rate of 90. Both the MA and sensitivity have been adjusted and the patient is still not capturing in the requested mode. Three other RN's attempted to adjust pacer to requested settings with no success. Patient is able to be paced in the DDD mode, will continue with this setting until given other directions from MD.

## 2017-09-17 NOTE — Progress Notes (Signed)
4 Days Post-Op Procedure(s) (LRB): CORONARY ARTERY BYPASS GRAFTING (CABG) times four LIMA  to LAD, SVG sequentially to OM and RAMUS Intermediate and SVG to Acute Marginal (N/A) TRANSESOPHAGEAL ECHOCARDIOGRAM (TEE) (N/A) MITRAL VALVE REPAIR (MVR) (N/A) Subjective: Remains lethargic, does follow commands  Objective: Vital signs in last 24 hours: Temp:  [97.5 F (36.4 C)-98.2 F (36.8 C)] 97.5 F (36.4 C) (12/04 0400) Pulse Rate:  [62-98] 64 (12/04 0730) Cardiac Rhythm: Normal sinus rhythm (12/04 0700) Resp:  [9-26] 11 (12/04 0730) BP: (53-142)/(14-115) 89/45 (12/04 0730) SpO2:  [94 %-100 %] 98 % (12/04 0730) Weight:  [231 lb 0.7 oz (104.8 kg)] 231 lb 0.7 oz (104.8 kg) (12/04 0200)  Hemodynamic parameters for last 24 hours:    Intake/Output from previous day: 12/03 0701 - 12/04 0700 In: 1661.9 [I.V.:1661.9] Out: 2500  Intake/Output this shift: No intake/output data recorded.  General appearance: slowed mentation Neurologic: lethargic, moving all 4 adn following commands Heart: regular rate and rhythm Lungs: diminished breath sounds bibasilar Abdomen: normal findings: soft, non-tender  Lab Results: Recent Labs    09/16/17 0354 09/17/17 0441  WBC 13.5* 14.0*  HGB 10.1* 10.0*  HCT 28.8* 28.9*  PLT 206 218   BMET:  Recent Labs    09/16/17 0354 09/17/17 0441  NA 132* 131*  K 3.9 4.3  CL 95* 91*  CO2 24 24  GLUCOSE 214* 247*  BUN 31* 23*  CREATININE 7.24* 6.10*  CALCIUM 7.5* 7.4*    PT/INR: No results for input(s): LABPROT, INR in the last 72 hours. ABG    Component Value Date/Time   PHART 7.427 09/15/2017 0320   HCO3 24.3 09/15/2017 0320   TCO2 26 09/14/2017 1645   ACIDBASEDEF 4.0 (H) 09/14/2017 0153   O2SAT 84.4 09/17/2017 0451   CBG (last 3)  Recent Labs    09/16/17 0820 09/16/17 1204 09/16/17 1955  GLUCAP 85 96 215*    Assessment/Plan: S/P Procedure(s) (LRB): CORONARY ARTERY BYPASS GRAFTING (CABG) times four LIMA  to LAD, SVG sequentially to  OM and RAMUS Intermediate and SVG to Acute Marginal (N/A) TRANSESOPHAGEAL ECHOCARDIOGRAM (TEE) (N/A) MITRAL VALVE REPAIR (MVR) (N/A) -  NEURO- altered mental status persists, nonfocal on exam  Likely TME, will check a head CT although I'm not sure there will be much clarity in setting of 5 previous strokes  CV- in SR on amiodarone- was started for VT preop- will dc and observe  BP still requiring levophed  Milrinone at 0.125 co-ox is 84- dc milrinone  RESP- concern due to inability to cooperate with pulmonary hygiene, no issues at present  RENAL- HD yesterday- Nephrology plans to do again today  ENDO- CBG up this AM, hesitant to increase insulin in setting of poor PO intake, follow  SCD for DVT prophylaxis- will add lovenox   LOS: 9 days    Ernest [name] 09/17/2017

## 2017-09-17 NOTE — Progress Notes (Addendum)
1900 Bedside shift report, pt sleeping, arouses to painful stimuli only. RN weaning down levo post HD. Day RN attempted to discontinue pacer but unable due to rhythm. Will attempt later tonight. Pt in bilateral upper arm restraints. Lines, tubes, gtts, checked and verified, WCTM.   2000 Pt kicking legs up and trying to move arms. RN into room, pt able to squeeze RN's hand on command, left hand only. Pt then fell back asleep.   2030 Pt down to CT with RN and transport.  2115 NT informed RN of low blood sugar of 42, D50 given. Pt given CHG bath and linen changed. Recheck of BS was 94. Pt sleeping comfortably, opens eyes to painful stimuli, does not follow simple commands. RN attempting to wean down levo. Fall precautions in place. WCTM.   2330 Pt responding to simple questions, denies pain. Restraints removed. Pt comfortably, still attempting to wean levo off. Fall precautions in place, WCTM.   0200 RN attempted to discontinue epicardial pacer. Underline rhythm appears A-flutter. Will order EKG in am to verify.   0430 Blood sugar low x2, will address with MD this am.   0530 Pt woke up agitated, pulling on epicardial wires and temp. Pacemaker. RN attempted to reorient pt, but pt got more agitated and started grabbing at RNs and kicking. 4 point restraints applied for pt safety. Fall precautions in place, [facility].

## 2017-09-17 NOTE — Plan of Care (Signed)
Pt is ESRD, HD pt

## 2017-09-17 NOTE — Progress Notes (Signed)
Inpatient Diabetes Program Recommendations  AACE/ADA: New Consensus Statement on Inpatient Glycemic Control (2015)  Target Ranges:  Prepandial:   less than 140 mg/dL      Peak postprandial:   less than 180 mg/dL (1-2 hours)      Critically ill patients:  140 - 180 mg/dL   Lab Results  Component Value Date   GLUCAP 212 (H) 09/17/2017   HGBA1C 9.1 (H) 09/08/2017    Review of Glycemic Control  FBS > 180 mg/dL. Needs insulin adjustment  Recommendations: Increase Lantus to 13 units QD Consider restarting Novolog 3 units tidwc.  Will follow closely.  Thank you. Lorenda Peck, RD, LDN, CDE Inpatient Diabetes Coordinator [phone]

## 2017-09-17 NOTE — Progress Notes (Signed)
Report given to dialysis nurse. RN to call CT to schedule scan after completion.

## 2017-09-17 NOTE — Progress Notes (Signed)
Patient more awake after being placed back on pacer. He was able to eat some breakfast and also take his morning medications without complications. Pressures are improving slowly.

## 2017-09-17 NOTE — Progress Notes (Signed)
TCTS BRIEF SICU PROGRESS NOTE  4 Days Post-Op  S/P Procedure(s) (LRB): CORONARY ARTERY BYPASS GRAFTING (CABG) times four LIMA  to LAD, SVG sequentially to OM and RAMUS Intermediate and SVG to Acute Marginal (N/A) TRANSESOPHAGEAL ECHOCARDIOGRAM (TEE) (N/A) MITRAL VALVE REPAIR (MVR) (N/A)   Stable day but still somewhat lethargic Just finished dialysis NSR w/ stable BP Breathing comfortably w/ O2 sats 98% on 3 L/min  Plan: Continue current plan  Rexene Alberts, MD 09/17/2017 6:33 PM

## 2017-09-18 ENCOUNTER — Inpatient Hospital Stay (HOSPITAL_COMMUNITY): Payer: Medicare Other

## 2017-09-18 LAB — CBC
HEMATOCRIT: 29.8 % — AB (ref 39.0–52.0)
Hemoglobin: 10.3 g/dL — ABNORMAL LOW (ref 13.0–17.0)
MCH: 28.5 pg (ref 26.0–34.0)
MCHC: 34.6 g/dL (ref 30.0–36.0)
MCV: 82.3 fL (ref 78.0–100.0)
PLATELETS: 244 10*3/uL (ref 150–400)
RBC: 3.62 MIL/uL — AB (ref 4.22–5.81)
RDW: 14.9 % (ref 11.5–15.5)
WBC: 12.2 10*3/uL — AB (ref 4.0–10.5)

## 2017-09-18 LAB — COOXEMETRY PANEL
Carboxyhemoglobin: 1.6 % — ABNORMAL HIGH (ref 0.5–1.5)
Methemoglobin: 0.9 % (ref 0.0–1.5)
O2 SAT: 89.5 %
TOTAL HEMOGLOBIN: 10.3 g/dL — AB (ref 12.0–16.0)

## 2017-09-18 LAB — GLUCOSE, CAPILLARY
GLUCOSE-CAPILLARY: 48 mg/dL — AB (ref 65–99)
GLUCOSE-CAPILLARY: 118 mg/dL — AB (ref 65–99)
GLUCOSE-CAPILLARY: 130 mg/dL — AB (ref 65–99)
GLUCOSE-CAPILLARY: 189 mg/dL — AB (ref 65–99)
Glucose-Capillary: 121 mg/dL — ABNORMAL HIGH (ref 65–99)
Glucose-Capillary: 151 mg/dL — ABNORMAL HIGH (ref 65–99)
Glucose-Capillary: 81 mg/dL (ref 65–99)

## 2017-09-18 LAB — BASIC METABOLIC PANEL
Anion gap: 14 (ref 5–15)
BUN: 19 mg/dL (ref 6–20)
CHLORIDE: 93 mmol/L — AB (ref 101–111)
CO2: 28 mmol/L (ref 22–32)
Calcium: 7.3 mg/dL — ABNORMAL LOW (ref 8.9–10.3)
Creatinine, Ser: 4.75 mg/dL — ABNORMAL HIGH (ref 0.61–1.24)
GFR calc non Af Amer: 11 mL/min — ABNORMAL LOW (ref >60)
GFR, EST AFRICAN AMERICAN: 13 mL/min — AB (ref >60)
Glucose, Bld: 215 mg/dL — ABNORMAL HIGH (ref 65–99)
POTASSIUM: 3.7 mmol/L (ref 3.5–5.1)
SODIUM: 135 mmol/L (ref 135–145)

## 2017-09-18 MED ORDER — AMIODARONE HCL IN DEXTROSE 360-4.14 MG/200ML-% IV SOLN
30.0000 mg/h | INTRAVENOUS | Status: DC
  Administered 2017-09-18 (×2): 60 mg/h via INTRAVENOUS
  Administered 2017-09-18 – 2017-09-21 (×6): 30 mg/h via INTRAVENOUS
  Filled 2017-09-18 (×8): qty 200

## 2017-09-18 MED ORDER — CLOPIDOGREL BISULFATE 75 MG PO TABS
75.0000 mg | ORAL_TABLET | Freq: Every day | ORAL | Status: DC
  Administered 2017-09-18 – 2017-10-03 (×16): 75 mg via ORAL
  Filled 2017-09-18 (×16): qty 1

## 2017-09-18 MED ORDER — AMITRIPTYLINE HCL 50 MG PO TABS
25.0000 mg | ORAL_TABLET | Freq: Every day | ORAL | Status: DC
  Administered 2017-09-18 – 2017-10-03 (×15): 25 mg via ORAL
  Filled 2017-09-18 (×17): qty 1

## 2017-09-18 MED ORDER — DEXTROSE 50 % IV SOLN
INTRAVENOUS | Status: AC
  Administered 2017-09-18: 50 mL
  Filled 2017-09-18: qty 50

## 2017-09-18 NOTE — Progress Notes (Signed)
5 Days Post-Op Procedure(s) (LRB): CORONARY ARTERY BYPASS GRAFTING (CABG) times four LIMA  to LAD, SVG sequentially to OM and RAMUS Intermediate and SVG to Acute Marginal (N/A) TRANSESOPHAGEAL ECHOCARDIOGRAM (TEE) (N/A) MITRAL VALVE REPAIR (MVR) (N/A) Subjective: Events overnight noted Was intermittently cooperative and agitated Received haldol and morphine this AM at 5:30  Objective: Vital signs in last 24 hours: Temp:  [97.5 F (36.4 C)-98.6 F (37 C)] 97.9 F (36.6 C) (12/05 0800) Pulse Rate:  [87-92] 90 (12/05 0800) Cardiac Rhythm: Atrial paced (12/05 0800) Resp:  [9-29] 10 (12/05 0800) BP: (66-156)/(18-95) 111/34 (12/05 0800) SpO2:  [95 %-100 %] 100 % (12/05 0800) Weight:  [230 lb 6.1 oz (104.5 kg)-234 lb 9.1 oz (106.4 kg)] 230 lb 6.1 oz (104.5 kg) (12/05 0425)  Hemodynamic parameters for last 24 hours:    Intake/Output from previous day: 12/04 0701 - 12/05 0700 In: 570.5 [P.O.:120; I.V.:450.5] Out: 1650  Intake/Output this shift: Total I/O In: 14.1 [I.V.:14.1] Out: -   General appearance: slowed mentation Neurologic: moves all 4 ext with good strength Heart: regular rate and rhythm and Paced Lungs: clear to auscultation bilaterally Abdomen: normal findings: soft, non-tender  Lab Results: Recent Labs    09/17/17 0441 09/18/17 0434  WBC 14.0* 12.2*  HGB 10.0* 10.3*  HCT 28.9* 29.8*  PLT 218 244   BMET:  Recent Labs    09/17/17 0441 09/18/17 0434  NA 131* 135  K 4.3 3.7  CL 91* 93*  CO2 24 28  GLUCOSE 247* 215*  BUN 23* 19  CREATININE 6.10* 4.75*  CALCIUM 7.4* 7.3*    PT/INR: No results for input(s): LABPROT, INR in the last 72 hours. ABG    Component Value Date/Time   PHART 7.427 09/15/2017 0320   HCO3 24.3 09/15/2017 0320   TCO2 26 09/14/2017 1645   ACIDBASEDEF 4.0 (H) 09/14/2017 0153   O2SAT 84.4 09/17/2017 0451   CBG (last 3)  Recent Labs    09/18/17 0011 09/18/17 0431 09/18/17 0619  GLUCAP 81 48* 118*    Assessment/Plan: S/P  Procedure(s) (LRB): CORONARY ARTERY BYPASS GRAFTING (CABG) times four LIMA  to LAD, SVG sequentially to OM and RAMUS Intermediate and SVG to Acute Marginal (N/A) TRANSESOPHAGEAL ECHOCARDIOGRAM (TEE) (N/A) MITRAL VALVE REPAIR (MVR) (N/A) -NEURO- altered mental status c/w toxic metabolic encephalopathy  Head CT showed no acute stroke  Minimize sedating meds- dc morphine  Restart Elavil  CV- still on levophed drip  In atrial flutter/ V bigeminy- paced for rate- will restart amiodarone drip  Check co-ox  RESP- CXR OK, good sats on 3l [place]  RENAL- HD yesterday, lytes OK  Plan per Nephrology  ENDO- hypoglycemia overnight- dc lantus until PO improves  SCD + enoxaparin for DVT prophylaxis   LOS: 10 days    Ernest [name] 09/18/2017

## 2017-09-18 NOTE — Progress Notes (Signed)
      [address]       [place] [address]             [phone]      Had a good day today Was able to stand with OT Speech still slurred but is more attentive Denies pain  Remo Lipps C. Roxan Hockey, MD [facility] [phone]

## 2017-09-18 NOTE — Progress Notes (Signed)
[facility] Progress Note   Assessment/ Plan:    Dialysis: [facility] MWF 4h 16mn  95.5kg   2/2 bath  LUA AVG  Hep none -venofer 50 q wk -calc 0.75 w HD   1. Severe CAD - sp CABG 12/1.  Milrinone d/c'd. still on some levophed 2. AMS - multifact.  For CT head today, suspect metabolic encephalopathy 3. ESRD HD MWF.  Serial HD due to vol overload, HD yesterday 12/4 with treatment likely again today 12/5. 4. CM EF 45-50% 5. Vol overload - still vol up, serial HD to improve vol status 6. Hypotension - back on low dose levo gtt for BP support; keep SBP > 90 7. Anemia of CKD - Hb down 10.0, follow (Co-ox shows Hgb of 5.3 taken 10 min after phlebotomy, think this is error, if high clinical suspicion can repeat CBC) 8. DM w mult complications 9. Hx CVA (x 5 approx)- CT head negative 12/4   Subjective:    Pacing increased to 90 yesterday with improvement in BP.   CXR with persistent pulm edema.  Only 1L off with dialysis yesterday.   Objective:   BP (!) 111/34 (BP Location: Right Wrist)   Pulse 90   Temp 97.9 F (36.6 C) (Oral)   Resp 10   Ht _0  (1.753 m)   Wt 104.5 kg (230 lb 6.1 oz)   SpO2 100%   BMI 34.02 kg/m   Physical Exam: GEN: Confused, lying flat in bed PULM: bibasilar crackles improved CV: RRR no mrg, sternal scar intact ABD obese soft ntnd EXT: LE edema improved, still upper extremity edema L > R LUA AVG +bruit NEURO: follows commands, can't carry on a conversation    Labs: BMET Recent Labs  Lab 09/12/17 1023 09/13/17 0227  09/13/17 2106 09/14/17 0347 09/14/17 1638 09/14/17 1645 09/15/17 0330 09/16/17 0354 09/17/17 0441 09/18/17 0434  NA 130* 132*   < > 138 136  --  137 133* 132* 131* 135  K 4.6 4.0   < > 4.4 4.3  --  3.5 4.1 3.9 4.3 3.7  CL 93* 93*   < > 102 103  --  97* 96* 95* 91* 93*  CO2 21* 27  --   --  23  --   --  _1 GLUCOSE 267* 141*   < > 172* 107*  --  125* 223* 214* 247* 215*  BUN 50* 28*   < > 27* 29*  --  16  22* 31* 23* 19  CREATININE 9.15* 6.42*   < > 5.80* 6.53* 4.21* 3.90* 5.67* 7.24* 6.10* 4.75*  CALCIUM 8.3* 8.2*  --   --  7.2*  --   --  7.4* 7.5* 7.4* 7.3*  PHOS 5.5*  --   --   --   --   --   --   --   --   --   --    < > = values in this interval not displayed.   CBC Recent Labs  Lab 09/15/17 0330 09/16/17 0354 09/17/17 0441 09/18/17 0434  WBC 13.1* 13.5* 14.0* 12.2*  HGB 9.9* 10.1* 10.0* 10.3*  HCT 28.8* 28.8* 28.9* 29.8*  MCV 83.2 81.6 81.9 82.3  PLT 178 206 218 244    _2 @ Medications:    . acetaminophen  1,000 mg Oral Q6H   Or  . acetaminophen (TYLENOL) oral liquid 160 mg/5 mL  1,000 mg Per Tube Q6H  . amitriptyline  25 mg Oral  QHS  . aspirin EC  325 mg Oral Daily   Or  . aspirin  324 mg Per Tube Daily  . atorvastatin  80 mg Oral q1800  . bisacodyl  10 mg Oral Daily   Or  . bisacodyl  10 mg Rectal Daily  . calcitRIOL  0.75 mcg Oral Q M,W,F-HD  . chlorhexidine gluconate (MEDLINE KIT)  15 mL Mouth Rinse BID  . Chlorhexidine Gluconate Cloth  6 each Topical Q0600  . cinacalcet  120 mg Oral Q supper  . clopidogrel  75 mg Oral Daily  . docusate sodium  200 mg Oral Daily  . enoxaparin (LOVENOX) injection  30 mg Subcutaneous Q24H  . Influenza vac split quadrivalent PF  0.5 mL Intramuscular Tomorrow-1000  . insulin aspart  0-15 Units Subcutaneous TID WC  . insulin aspart  0-5 Units Subcutaneous QHS  . levETIRAcetam  1,000 mg Oral Daily  . levETIRAcetam  500 mg Oral Q M,W,F-HD  . mouth rinse  15 mL Mouth Rinse QID  . metoprolol tartrate  12.5 mg Oral BID   Or  . metoprolol tartrate  12.5 mg Per Tube BID  . multivitamin  1 tablet Oral QHS  . pantoprazole  40 mg Oral Daily  . risperiDONE  0.5 mg Oral QHS  . sevelamer carbonate  2,400 mg Oral TID WC  . sodium chloride flush  3 mL Intravenous Q12H     Madelon Lips MD [facility] pgr [phone] 09/18/2017, 9:11 AM

## 2017-09-18 NOTE — Evaluation (Signed)
Occupational Therapy Evaluation Patient Details Name: Ernest [name] MRN: [id number] DOB: 11/24/1945 Today's Date: 09/18/2017    History of Present Illness Pt adm 11/24 with NSTEMI and underwent CABG x 4 and MVR on 08/18/17. Post op lethargy and confusion. PMH - Multiple CVA's, DM, HTN, chf, ESRD on HD, cad, copd   Clinical Impression   Pt admitted with above. He demonstrates the below listed deficits and will benefit from continued OT to maximize safety and independence with BADLs.  Pt lethargic, but was able to assist with bed mobility and did attempt to follow commands ~75% of the time.   He was unable to engage in ADLs tasks due to weakness and impaired attention.  He was able to move sit to partial stand x 3 with mod A, to scoot up the EOB.  Anticipate he will require post acute rehab.  At present my recommendation is SNF, but that may change as his cognition improves.  Wife does not appear to be able to assist him much physically.        Follow Up Recommendations  SNF    Equipment Recommendations  3 in 1 bedside commode    Recommendations for Other Services       Precautions / Restrictions Precautions Precautions: Sternal;Fall Restrictions Weight Bearing Restrictions: Yes Other Position/Activity Restrictions: Sternal precautions      Mobility Bed Mobility Overal bed mobility: Needs Assistance Bed Mobility: Supine to Sit;Sit to Supine     Supine to sit: Mod assist Sit to supine: Min assist   General bed mobility comments: assist to initiate activity.  Assist to move LEs off bed and to lift trunk.  He required assist to control return to bed.  He was able to lift LEs onto bed   Transfers Overall transfer level: Needs assistance Equipment used: Rolling walker (2 wheeled) Transfers: Sit to/from [facility] Sit to Stand: Mod assist(partial stand )         General transfer comment: Pt moved sit to partial stand to move up EOB     Balance Overall  balance assessment: Needs assistance Sitting-balance support: No upper extremity supported;Feet unsupported Sitting balance-Leahy Scale: Fair Sitting balance - Comments: sat EOB with min guard assist x 15 mins                                    ADL either performed or assessed with clinical judgement   ADL Overall ADL's : Needs assistance/impaired Eating/Feeding: Total assistance;Bed level   Grooming: Wash/dry hands;Wash/dry face;Oral care;Brushing hair;Total assistance;Bed level   Upper Body Bathing: Total assistance;Bed level   Lower Body Bathing: Total assistance;Sit to/from stand   Upper Body Dressing : Total assistance;Bed level   Lower Body Dressing: Total assistance;Sit to/from stand   Toilet Transfer: Total assistance   Toileting- Clothing Manipulation and Hygiene: Total assistance               Vision Baseline Vision/History: Retinopathy Additional Comments: wife states pt with visual deficits "has sugar in his eyes".  Pt keeps eyes rolled up.  He does not attempt to track or fixate on objects      Perception     Praxis      Pertinent Vitals/Pain Pain Assessment: Faces Faces Pain Scale: No hurt     Hand Dominance Right   Extremity/Trunk Assessment Upper Extremity Assessment Upper Extremity Assessment: Generalized weakness(bil. UEs tremulous )   Lower Extremity Assessment  Lower Extremity Assessment: Defer to PT evaluation   Cervical / Trunk Assessment Cervical / Trunk Assessment: Normal   Communication Communication Communication: Other (comment)(Pt minimally conversant.   Slurred speech )   Cognition Arousal/Alertness: Lethargic Behavior During Therapy: Flat affect Overall Cognitive Status: Impaired/Different from baseline Area of Impairment: Attention;Following commands                   Current Attention Level: Focused   Following Commands: Follows one step commands inconsistently       General Comments: Pt  followed one step commands ~75% of time    General Comments  VSS     Exercises     Shoulder Instructions      Home Living Family/patient expects to be discharged to:: Private residence Living Arrangements: Spouse/significant other Available Help at Discharge: Family;Available 24 hours/day Type of Home: House Home Access: Stairs to enter [employer] of Steps: 1 Entrance Stairs-Rails: None Home Layout: Two level;1/2 bath on main level;Bed/bath upstairs Alternate Level Stairs-Number of Steps: 14 Alternate Level Stairs-Rails: Left Bathroom Shower/Tub: Teacher, early years/pre: Standard Bathroom Accessibility: No   Home Equipment: Environmental consultant - 2 wheels;Cane - single point;Shower seat;Hand held shower head   Additional Comments: wife confirms above into       Prior Functioning/Environment Level of Independence: Needs assistance  Gait / Transfers Assistance Needed: amb with cane or RW.   ADL's / Homemaking Assistance Needed: required assist with fasteners             OT Problem List: Decreased strength;Decreased activity tolerance;Impaired balance (sitting and/or standing);Decreased cognition;Decreased safety awareness;Decreased knowledge of use of DME or AE;Decreased knowledge of precautions;Cardiopulmonary status limiting activity;Obesity;Increased edema      OT Treatment/Interventions: Self-care/ADL training;Energy conservation;DME and/or AE instruction;Therapeutic activities;Cognitive remediation/compensation;Patient/family education;Balance training    OT Goals(Current goals can be found in the care plan section) Acute Rehab OT Goals Patient Stated Goal: Pt unable to state OT Goal Formulation: With patient/family Time For Goal Achievement: 10/02/17 Potential to Achieve Goals: Good ADL Goals Pt Will Perform Grooming: with set-up;sitting Pt Will Perform Upper Body Bathing: with min assist;sitting Pt Will Perform Lower Body Bathing: with mod assist;sit  to/from stand;with adaptive equipment Pt Will Perform Upper Body Dressing: with supervision Pt Will Perform Lower Body Dressing: with adaptive equipment;with mod assist;sit to/from stand Pt Will Transfer to Toilet: with min assist;ambulating;regular height toilet;bedside commode Pt Will Perform Toileting - Clothing Manipulation and hygiene: with min assist;sit to/from stand Additional ADL Goal #1: Pt will sustain attention to ADL tasks x 5 mins without cues.  OT Frequency: Min 2X/week   Barriers to D/C: Decreased caregiver support  uncertain that wife can assist pt        Co-evaluation              AM-PAC PT "6 Clicks" Daily Activity     Outcome Measure Help from another person eating meals?: Total Help from another person taking care of personal grooming?: Total Help from another person toileting, which includes using toliet, bedpan, or urinal?: Total Help from another person bathing (including washing, rinsing, drying)?: Total Help from another person to put on and taking off regular upper body clothing?: Total Help from another person to put on and taking off regular lower body clothing?: Total 6 Click Score: 6   End of Session Nurse Communication: Mobility status  Activity Tolerance: Patient limited by lethargy Patient left: in bed;with call bell/phone within reach;with family/visitor present;with nursing/sitter in room  OT Visit  Diagnosis: Muscle weakness (generalized) (M62.81);Cognitive communication deficit (R41.841)                Time: 0981-1914 OT Time Calculation (min): 32 min Charges:  OT General Charges $OT Visit: 1 Visit OT Evaluation $OT Eval Moderate Complexity: 1 Mod OT Treatments $Therapeutic Activity: 8-22 mins G-Codes:     [facility], OTR/L [phone]   Lucille Passy M 09/18/2017, 5:46 PM

## 2017-09-18 NOTE — Progress Notes (Signed)
Hemodialysis:  Attempted to set up HD machine at pts bedside this afternoon.  I tried three different HD machines and they all had electrical issues.  After the second one would not work, I  Decided to set the next machine up on the HD unit and then bring it down to the pts room.  I set it up and it went through self test and passed on the HD unit.  When I took it to the pts bedside, however, it would not come on at all.  Therefore, I notified the nephrologist and orders were given to do pt in the morning after facilities looks at the outlets in the room.

## 2017-09-18 NOTE — Consult Note (Addendum)
NEURO HOSPITALIST CONSULT NOTE   Requestig physician: Dr. Roxan Hockey   Reason for Consult: Slurred speech   History obtained from: Patient and Chart     HPI:                                                                                                                                          Ernest [name] is an 71 y.o. male with history of CVA (x5), severe CAD s/p CABG on 12/1, ESRD, Hypotension now with altered mental status and slurred speech. Patient was doing well post CABG until 2 days ago when he was noted to be more lethargic, and confused with slurred speech. No focal deficits were noted. CT head was obtained yesterday with no acute intracranial process. Neurology consulted for further recommendations.   Past Medical History:  Diagnosis Date  . Abnormal stress test    s/p cath November 2013 with modest disease involving the ostial left main, proximal LAD, proximal RCA - do not appear to be hemodynamically signficant; mild LV dysfunction  . Bacteremia   . Chronic systolic CHF (congestive heart failure) ([place])   . COPD (chronic obstructive pulmonary disease) ([place])   . Coronary artery disease    per cath report 2013  . Ejection fraction < 50%   . Erectile dysfunction    penile implant  . ESRD (end stage renal disease) ([place])    Started HD in [place] in 2009, ESRD was due to DM. Moved to [facility] in Dec 2009 and now gets dialysis at [facility] on a MWF schedule.     . Gout   . Gout    Hx: of  . Hepatitis C   . Hyperlipidemia   . Hypertension   . Obesity   . Retinopathy   . Shortness of breath    Hx: of with exertion  . Stroke ([place])   . Type II or unspecified type diabetes mellitus without mention of complication, not stated as uncontrolled    adult onset    Past Surgical History:  Procedure Laterality Date  . AV FISTULA PLACEMENT Left 01/13/2013   Procedure: INSERTION OF ARTERIOVENOUS (AV) GORE-TEX GRAFT ARM;  Surgeon: Angelia Mould, MD;   Location: [place];  Service: Vascular;  Laterality: Left;  . AV FISTULA PLACEMENT Left 05/05/2013   Procedure: INSERTION OF LEFT UPPER ARM  ARTERIOVENOUS GORTEX GRAFT;  Surgeon: Angelia Mould, MD;  Location: [place];  Service: Vascular;  Laterality: Left;  . [place] REMOVAL Left 03/26/2013   Procedure: REMOVAL OF  NON INCORPORATED ARTERIOVENOUS GORETEX GRAFT ([place]) left arm * repair of  left brachial artery with vein patch angioplasty.;  Surgeon: Angelia Mould, MD;  Location: [place];  Service: Vascular;  Laterality: Left;  . CARDIAC CATHETERIZATION  August 26, 2012  . CATARACT EXTRACTION W/ INTRAOCULAR LENS  IMPLANT, BILATERAL    . COLONOSCOPY    . CORONARY ARTERY BYPASS GRAFT N/A 09/13/2017   Procedure: CORONARY ARTERY BYPASS GRAFTING (CABG) times four LIMA  to LAD, SVG sequentially to OM and RAMUS Intermediate and SVG to Acute Marginal;  Surgeon: Melrose Nakayama, MD;  Location: [place];  Service: Open Heart Surgery;  Laterality: N/A;  . EMBOLECTOMY Right 08/27/2013   Procedure: EMBOLECTOMY;  Surgeon: Mal Misty, MD;  Location: [place];  Service: Vascular;  Laterality: Right;  Thrombectomy of Radial and ulnar artery.  [name] EYE SURGERY     laser.  and surgery for DM  . fistula     RUE and wrist  . INSERTION OF DIALYSIS CATHETER Right 01/01/2013   Procedure: INSERTION OF DIALYSIS CATHETER right  internal jugular;  Surgeon: Serafina Mitchell, MD;  Location: [place];  Service: Vascular;  Laterality: Right;  . INSERTION OF DIALYSIS CATHETER N/A 03/26/2013   Procedure: INSERTION OF DIALYSIS CATHETER Left internal jugular vein;  Surgeon: Angelia Mould, MD;  Location: [place];  Service: Vascular;  Laterality: N/A;  . LEFT HEART CATH AND CORONARY ANGIOGRAPHY N/A 09/09/2017   Procedure: LEFT HEART CATH AND CORONARY ANGIOGRAPHY;  Surgeon: Troy Sine, MD;  Location: [place] CV LAB;  Service: Cardiovascular;  Laterality: N/A;  . LIGATION OF ARTERIOVENOUS  FISTULA Right 12/30/2013    Procedure: REMOVAL OF SEGMENT OF GORTEX GRAFT AND FISTULA  AND REPAIR OF BRACHIAL ARTERY;  Surgeon: Mal Misty, MD;  Location: [place];  Service: Vascular;  Laterality: Right;  . MITRAL VALVE REPAIR N/A 09/13/2017   Procedure: MITRAL VALVE REPAIR (MVR);  Surgeon: Melrose Nakayama, MD;  Location: [place];  Service: Open Heart Surgery;  Laterality: N/A;  . PENILE PROSTHESIS IMPLANT     1997.. no card  . TEE WITHOUT CARDIOVERSION N/A 06/11/2013   Procedure: TRANSESOPHAGEAL ECHOCARDIOGRAM (TEE);  Surgeon: Larey Dresser, MD;  Location: [facility] ENDOSCOPY;  Service: Cardiovascular;  Laterality: N/A;  . TEE WITHOUT CARDIOVERSION N/A 09/13/2017   Procedure: TRANSESOPHAGEAL ECHOCARDIOGRAM (TEE);  Surgeon: Melrose Nakayama, MD;  Location: [place];  Service: Open Heart Surgery;  Laterality: N/A;  . THROMBECTOMY W/ EMBOLECTOMY Left 03/26/2013   Procedure: Attempted thrombectomy of left arm arteriovenous goretex graft.;  Surgeon: Angelia Mould, MD;  Location: [place];  Service: Vascular;  Laterality: Left;  [name] VENOGRAM N/A 04/07/2013   Procedure: VENOGRAM;  Surgeon: Serafina Mitchell, MD;  Location: [facility] CATH LAB;  Service: Cardiovascular;  Laterality: N/A;    Family History  Problem Relation Age of Onset  . Heart disease Father   . Diabetes Sister   . Diabetes Brother   . Diabetes Son     Social History:  reports that he quit smoking about 44 years ago. He quit after 7.00 years of use. he has never used smokeless tobacco. He reports that he does not drink alcohol or use drugs.  [name]  MEDICATIONS:  I have reviewed the patient's current medications.   ROS:                                                                                                                                       History obtained from chart review  General ROS: negative for - chills, fatigue,  fever, night sweats, weight gain or weight loss Psychological ROS: negative for - behavioral disorder, hallucinations, memory difficulties, mood swings or suicidal ideation Ophthalmic ROS: negative for - blurry vision, double vision, eye pain or loss of vision ENT ROS: negative for - epistaxis, nasal discharge, oral lesions, sore throat, tinnitus or vertigo Allergy and Immunology ROS: negative for - hives or itchy/watery eyes Hematological and Lymphatic ROS: negative for - bleeding problems, bruising or swollen lymph nodes Endocrine ROS: negative for - galactorrhea, hair pattern changes, polydipsia/polyuria or temperature intolerance Respiratory ROS: negative for - cough, hemoptysis, shortness of breath or wheezing Cardiovascular ROS: negative for - chest pain, dyspnea on exertion, edema or irregular heartbeat Gastrointestinal ROS: negative for - abdominal pain, diarrhea, hematemesis, nausea/vomiting or stool incontinence Genito-Urinary ROS: negative for - dysuria, hematuria, incontinence or urinary frequency/urgency Musculoskeletal ROS: negative for - joint swelling or muscular weakness Neurological ROS: as noted in HPI Dermatological ROS: negative for rash and skin lesion changes   Blood pressure 98/67, pulse 90, temperature 97.9 F (36.6 C), temperature source Oral, resp. rate 14, height 5\' 9"  (1.753 m), weight 230 lb 6.1 oz (104.5 kg), SpO2 100 %.   Neurologic Examination:                                                                                                      HEENT-  Normocephalic, no lesions, without obvious abnormality.  Normal external eye and conjunctiva.  Normal external nose, mucus membranes and septum.  Normal pharynx. Cardiovascular- regular rate and rhythm, pulses palpable throughout   Lungs- clear to ausculation bilatearlly Abdomen- soft, non-tender; bowel sounds normal; no masses,  no organomegaly Extremities- no edema Skin-warm and dry, no hyperpigmentation,  vitiligo, or suspicious lesions  Neurological Examination Mental Status: Alert, oriented, thought content appropriate.  Speech dysarthria but intelligible. Following simple commands. Slow mentation.  Cranial Nerves: II: Discs flat bilaterally; Visual fields grossly normal,  III,IV, VI: ptosis not present, not voluntarily tracking to test extra-ocular motions but does track me across the room without obvious deficits, pupils equal, round, reactive to light and accommodation V,VII: smile symmetric, facial light touch sensation normal bilaterally VIII: hearing normal  bilaterally IX,X: uvula rises symmetrically XI: bilateral shoulder shrug XII: midline tongue extension Motor: Right : Upper extremity   5/5    Left:     Upper extremity   5/5  Lower extremity   5/5     Lower extremity   5/5 Tone and bulk:normal tone throughout; no atrophy noted Sensory: Pinprick and light touch intact throughout, bilaterally Deep Tendon Reflexes: 2+ and symmetric throughout Plantars: Right: downgoing   Left: downgoing Cerebellar: Not tested, arms restrained Gait: not tested     Lab Results: Basic Metabolic Panel: Recent Labs  Lab 09/11/17 2036 09/12/17 1023 09/13/17 0227  09/13/17 2045  09/14/17 0347 09/14/17 1638 09/14/17 1645 09/15/17 0330 09/16/17 0354 09/17/17 0441 09/18/17 0434  NA 129* 130* 132*   < >  --    < > 136  --  137 133* 132* 131* 135  K 4.2 4.6 4.0   < >  --    < > 4.3  --  3.5 4.1 3.9 4.3 3.7  CL 92* 93* 93*   < >  --    < > 103  --  97* 96* 95* 91* 93*  CO2 23 21* 27  --   --   --  23  --   --  24 24 24 28   GLUCOSE 277* 267* 141*   < >  --    < > 107*  --  125* 223* 214* 247* 215*  BUN 45* 50* 28*   < >  --    < > 29*  --  16 22* 31* 23* 19  CREATININE 8.24* 9.15* 6.42*   < > 6.24*   < > 6.53* 4.21* 3.90* 5.67* 7.24* 6.10* 4.75*  CALCIUM 7.9* 8.3* 8.2*  --   --   --  7.2*  --   --  7.4* 7.5* 7.4* 7.3*  MG 2.2  --  2.2  --  2.2  --  2.2 1.9  --   --   --   --   --   PHOS   --  5.5*  --   --   --   --   --   --   --   --   --   --   --    < > = values in this interval not displayed.    Liver Function Tests: Recent Labs  Lab 09/12/17 1023 09/15/17 0330  AST  --  44*  ALT  --  23  ALKPHOS  --  44  BILITOT  --  0.5  PROT  --  5.1*  ALBUMIN 3.0* 2.5*   No results for input(s): LIPASE, AMYLASE in the last 168 hours. No results for input(s): AMMONIA in the last 168 hours.  CBC: Recent Labs  Lab 09/14/17 1638 09/14/17 1645 09/15/17 0330 09/16/17 0354 09/17/17 0441 09/18/17 0434  WBC 11.6*  --  13.1* 13.5* 14.0* 12.2*  HGB 10.5* 11.6* 9.9* 10.1* 10.0* 10.3*  HCT 30.4* 34.0* 28.8* 28.8* 28.9* 29.8*  MCV 82.6  --  83.2 81.6 81.9 82.3  PLT 147*  --  178 206 218 244    Cardiac Enzymes: Recent Labs  Lab 09/13/17 2045  CKTOTAL 844*    Lipid Panel: No results for input(s): CHOL, TRIG, HDL, CHOLHDL, VLDL, LDLCALC in the last 168 hours.  CBG: Recent Labs  Lab 09/17/17 2116 09/17/17 2220 09/18/17 0011 09/18/17 0431 09/18/17 0619  GLUCAP 42* 97 81 48* 118*    Microbiology: Results for  orders placed or performed during the hospital encounter of 09/07/17  MRSA PCR Screening     Status: None   Collection Time: 09/08/17  6:53 AM  Result Value Ref Range Status   MRSA by PCR NEGATIVE NEGATIVE Final    Comment:        The GeneXpert MRSA Assay (FDA approved for NASAL specimens only), is one component of a comprehensive MRSA colonization surveillance program. It is not intended to diagnose MRSA infection nor to guide or monitor treatment for MRSA infections.   Surgical pcr screen     Status: Abnormal   Collection Time: 09/12/17  4:39 PM  Result Value Ref Range Status   MRSA, PCR NEGATIVE NEGATIVE Final   Staphylococcus aureus POSITIVE (A) NEGATIVE Final    Comment: (NOTE) The Xpert SA Assay (FDA approved for NASAL specimens in patients 56 years of age and older), is one component of a comprehensive surveillance program. It is not  intended to diagnose infection nor to guide or monitor treatment.     Coagulation Studies: No results for input(s): LABPROT, INR in the last 72 hours.  Imaging: Ct Head Wo Contrast  Result Date: 09/17/2017 CLINICAL DATA:  Altered level of consciousness. History of end-stage renal disease on dialysis, hypertension, hyperlipidemia. EXAM: CT HEAD WITHOUT CONTRAST TECHNIQUE: Contiguous axial images were obtained from the base of the skull through the vertex without intravenous contrast. COMPARISON:  CT HEAD October 18, 2016 FINDINGS: BRAIN: No intraparenchymal hemorrhage, mass effect nor midline shift. Mild parenchymal brain volume loss. No hydrocephalus. Patchy supratentorial white matter hypodensities old small bilateral cerebellar infarcts. Scattered parenchymal vascular calcifications. No acute large vascular territory infarcts. No abnormal extra-axial fluid collections. Basal cisterns are patent. VASCULAR: Moderate calcific atherosclerosis of the carotid siphons. SKULL: No skull fracture. Extensive scalp calcifications associated with end-stage renal disease. No significant scalp soft tissue swelling. SINUSES/ORBITS: The mastoid air-cells and included paranasal sinuses are well-aerated.The included ocular globes and orbital contents are non-suspicious. Status post bilateral ocular lens implants. OTHER: Multiple absent teeth. IMPRESSION: 1. No acute intracranial process. 2. Stable mild parenchymal brain volume loss. 3. Stable mild-to-moderate chronic small vessel ischemic disease and old small cerebellar infarcts. Electronically Signed   By: Elon Alas M.D.   On: 09/17/2017 20:42   Dg Chest Port 1 View  Result Date: 09/18/2017 CLINICAL DATA:  Status post coronary bypass grafting EXAM: PORTABLE CHEST 1 VIEW COMPARISON:  09/16/2017 FINDINGS: Postsurgical changes are again seen. Cardiac shadow remains enlarged. Vascular congestion and patchy edema is again identified slightly worse on the left than  the right. No new focal abnormality is seen. No bony abnormality is noted. IMPRESSION: Stable changes of CHF with pulmonary edema. Electronically Signed   By: Inez Catalina M.D.   On: 09/18/2017 09:14    Assessment/Plan: 71 year old man with history of 5 previous strokes now 5 days s/p CABG with 2 day history of worsened mental status and slurred speech.   #Encepaholopathy and Dysarthria He is alert and oriented and can answer questions appropriate and follow most simple commands. No focal deficits on exam. Speech is dysarthric. Given his history would obtain MRI/MRA head and neck for further evaluation for possible new CVA.  Will discuss with cardiothoracic surgery if this would be able to be done given his recent CABG.  Continue ASA 325mg  Start patient on statin if tolerated Swallow eval NIHSS and neurochecks  Maryellen Pile, MD Internal Medicine Teaching Program PGY-3  NEUROHOSPITALIST ADDENDUM Seen and examined the patient this AM.  Formulated plan as documented above. Recommendations as above. Will follow.  Patient with severe dysarthria, increased lethargy - however able to answer all questions and follow commands. Metabolic workup,BUN within normal limits. Given prior strokes, it would be useful to r/o new stroke and CT head can miss small lacunar infarcts in posterior circulation. He does have penile implant do will need to see if this is compatible for MRI Brain/MRA.  Karena Addison Dontell Mian MD [facility] [phone]  If 7pm to 7am, please call on call as listed on AMION.

## 2017-09-19 ENCOUNTER — Inpatient Hospital Stay (HOSPITAL_COMMUNITY): Payer: Medicare Other

## 2017-09-19 DIAGNOSIS — I63011 Cerebral infarction due to thrombosis of right vertebral artery: Secondary | ICD-10-CM

## 2017-09-19 LAB — PHOSPHORUS
PHOSPHORUS: 3 mg/dL (ref 2.5–4.6)
Phosphorus: 3.6 mg/dL (ref 2.5–4.6)

## 2017-09-19 LAB — BASIC METABOLIC PANEL
Anion gap: 17 — ABNORMAL HIGH (ref 5–15)
BUN: 30 mg/dL — AB (ref 6–20)
CHLORIDE: 95 mmol/L — AB (ref 101–111)
CO2: 22 mmol/L (ref 22–32)
Calcium: 7.6 mg/dL — ABNORMAL LOW (ref 8.9–10.3)
Creatinine, Ser: 6.56 mg/dL — ABNORMAL HIGH (ref 0.61–1.24)
GFR calc non Af Amer: 8 mL/min — ABNORMAL LOW (ref >60)
GFR, EST AFRICAN AMERICAN: 9 mL/min — AB (ref >60)
GLUCOSE: 168 mg/dL — AB (ref 65–99)
Potassium: 4.8 mmol/L (ref 3.5–5.1)
Sodium: 134 mmol/L — ABNORMAL LOW (ref 135–145)

## 2017-09-19 LAB — CBC
HEMATOCRIT: 31.8 % — AB (ref 39.0–52.0)
HEMOGLOBIN: 11.1 g/dL — AB (ref 13.0–17.0)
MCH: 29.2 pg (ref 26.0–34.0)
MCHC: 34.9 g/dL (ref 30.0–36.0)
MCV: 83.7 fL (ref 78.0–100.0)
Platelets: 205 10*3/uL (ref 150–400)
RBC: 3.8 MIL/uL — ABNORMAL LOW (ref 4.22–5.81)
RDW: 15.5 % (ref 11.5–15.5)
WBC: 10.3 10*3/uL (ref 4.0–10.5)

## 2017-09-19 LAB — HEPARIN LEVEL (UNFRACTIONATED)
Heparin Unfractionated: >2.2 IU/mL — ABNORMAL HIGH (ref 0.30–0.70)
Heparin Unfractionated: 0.32 IU/mL (ref 0.30–0.70)

## 2017-09-19 LAB — GLUCOSE, CAPILLARY
GLUCOSE-CAPILLARY: 93 mg/dL (ref 65–99)
GLUCOSE-CAPILLARY: 121 mg/dL — AB (ref 65–99)
Glucose-Capillary: 146 mg/dL — ABNORMAL HIGH (ref 65–99)
Glucose-Capillary: 157 mg/dL — ABNORMAL HIGH (ref 65–99)

## 2017-09-19 LAB — MAGNESIUM
Magnesium: 1.9 mg/dL (ref 1.7–2.4)
Magnesium: 2 mg/dL (ref 1.7–2.4)

## 2017-09-19 LAB — COOXEMETRY PANEL
Carboxyhemoglobin: 1 % (ref 0.5–1.5)
Methemoglobin: 0.9 % (ref 0.0–1.5)
O2 Saturation: 41.5 %
Total hemoglobin: 11 g/dL — ABNORMAL LOW (ref 12.0–16.0)

## 2017-09-19 MED ORDER — ALTEPLASE 2 MG IJ SOLR: 2.0000 mg | Freq: Once | INTRAMUSCULAR | Status: DC | PRN

## 2017-09-19 MED ORDER — LIDOCAINE HCL (PF) 1 % IJ SOLN: 5.0000 mL | INTRAMUSCULAR | Status: DC | PRN

## 2017-09-19 MED ORDER — NOREPINEPHRINE BITARTRATE 1 MG/ML IV SOLN
0.0000 ug/min | INTRAVENOUS | Status: DC
  Administered 2017-09-19: 2 ug/min via INTRAVENOUS
  Filled 2017-09-19: qty 4

## 2017-09-19 MED ORDER — HEPARIN SODIUM (PORCINE) 1000 UNIT/ML DIALYSIS: 1000.0000 [IU] | INTRAMUSCULAR | Status: DC | PRN

## 2017-09-19 MED ORDER — PRO-STAT SUGAR FREE PO LIQD
30.0000 mL | Freq: Two times a day (BID) | ORAL | Status: DC
  Administered 2017-09-20 – 2017-09-29 (×20): 30 mL
  Filled 2017-09-19 (×20): qty 30

## 2017-09-19 MED ORDER — ASPIRIN 81 MG PO CHEW
81.0000 mg | CHEWABLE_TABLET | Freq: Every day | ORAL | Status: DC
  Administered 2017-09-20 – 2017-10-03 (×14): 81 mg via ORAL
  Filled 2017-09-19 (×14): qty 1

## 2017-09-19 MED ORDER — SODIUM CHLORIDE 0.9 % IV SOLN: 100.0000 mL | INTRAVENOUS | Status: DC | PRN

## 2017-09-19 MED ORDER — PENTAFLUOROPROP-TETRAFLUOROETH EX AERO: 1.0000 "application " | INHALATION_SPRAY | CUTANEOUS | Status: DC | PRN

## 2017-09-19 MED ORDER — LIDOCAINE-PRILOCAINE 2.5-2.5 % EX CREA
1.0000 "application " | TOPICAL_CREAM | CUTANEOUS | Status: DC | PRN
  Filled 2017-09-19: qty 5

## 2017-09-19 MED ORDER — IOPAMIDOL (ISOVUE-370) INJECTION 76%
INTRAVENOUS | Status: AC
  Administered 2017-09-19: 100 mL
  Filled 2017-09-19: qty 50

## 2017-09-19 MED ORDER — VITAL HIGH PROTEIN PO LIQD: 1000.0000 mL | ORAL | Status: DC

## 2017-09-19 MED ORDER — IOPAMIDOL (ISOVUE-370) INJECTION 76%
INTRAVENOUS | Status: AC
  Filled 2017-09-19: qty 50

## 2017-09-19 MED ORDER — NOREPINEPHRINE BITARTRATE 1 MG/ML IV SOLN
0.0000 ug/min | INTRAVENOUS | Status: DC
  Administered 2017-09-19: 12 ug/min via INTRAVENOUS
  Administered 2017-09-20: 10 ug/min via INTRAVENOUS
  Administered 2017-09-22: 9 ug/min via INTRAVENOUS
  Administered 2017-09-24: 16 ug/min via INTRAVENOUS
  Filled 2017-09-19 (×4): qty 16

## 2017-09-19 MED ORDER — NEPRO/CARBSTEADY PO LIQD
1000.0000 mL | ORAL | Status: DC
  Administered 2017-09-20 – 2017-09-26 (×8): 1000 mL
  Filled 2017-09-19 (×11): qty 1000

## 2017-09-19 MED ORDER — HEPARIN (PORCINE) IN NACL 100-0.45 UNIT/ML-% IJ SOLN
1750.0000 [IU]/h | INTRAMUSCULAR | Status: DC
  Administered 2017-09-19: 1200 [IU]/h via INTRAVENOUS
  Administered 2017-09-21 – 2017-09-22 (×4): 1300 [IU]/h via INTRAVENOUS
  Administered 2017-09-25 – 2017-09-29 (×8): 1750 [IU]/h via INTRAVENOUS
  Filled 2017-09-19 (×17): qty 250

## 2017-09-19 NOTE — Progress Notes (Signed)
Initial Nutrition Assessment  DOCUMENTATION CODES:   Obesity unspecified  INTERVENTION:   Tube Feeding:   Nepro @ 50 ml/hr  Pro-Stat 30 mL BID Provides 2360 kcals, 127 g of protein and 876 mL of free water Meets 100% estimated calorie and protein needs   NUTRITION DIAGNOSIS:   Increased nutrient needs related to acute illness, chronic illness(CABG, ESRD on HD) as evidenced by estimated needs.  GOAL:   Patient will meet greater than or equal to 90% of their needs   MONITOR:   TF tolerance, Labs, Weight trends, I & O's, Skin  REASON FOR ASSESSMENT:   Consult Enteral/tube feeding initiation and management  ASSESSMENT:   71 yo male admitted NSTEMI, severe CAD s/p CABG on 12/1. Pt with hx of CHF, ESRD on HD, DM, gout, COPD, CAD, CVA with residual weakness  11/30 CABG x 4 12/4 Change in mental status, neurology consulted, CT head negative 12/6 Drastic change in mental status, HD today and likely again tomorrow  Pt not alert enough to obtain diet or weight history. No family at bedside.   Dry wt 95.5 kg, current wt of 101.7 kg. Per weight encounters, no recent wt loss Pt is anuric. Net neutral fluid status per I/O flowsheet but noted pt with significant edema on exam  Recorded po intake >50% of meals prior to today. Pt unable to eat breakfast or lunch today. Noted order for Cortrak tube placement  Labs: sodium 134 Meds: sensipar, calcitriol, renvela, renal MVI  NUTRITION - FOCUSED PHYSICAL EXAM:    Most Recent Value  Orbital Region  Mild depletion  Upper Arm Region  No depletion  Thoracic and Lumbar Region  No depletion  Buccal Region  Mild depletion  Temple Region  Mild depletion  Clavicle Bone Region  No depletion  Clavicle and Acromion Bone Region  No depletion  Scapular Bone Region  No depletion  Dorsal Hand  Unable to assess  Patellar Region  Mild depletion  Anterior Thigh Region  Moderate depletion  Posterior Calf Region  Mild depletion  Edema (RD  Assessment)  Moderate  Hair  Reviewed  Eyes  Unable to assess  Mouth  Unable to assess  Skin  Reviewed  Nails  Unable to assess     Pt may meet clinical characteristics for malnutrition but unable to determine with current information available  Diet Order:  Diet renal/carb modified with fluid restriction Diet-HS Snack? Nothing; Room service appropriate? No; Fluid consistency: Thin  EDUCATION NEEDS:   No education needs have been identified at this time  Skin:  Skin Assessment: Reviewed RN Assessment  Last BM:  11/28  Height:   Ht Readings from Last 1 Encounters:  09/09/17 5\' 9"  (1.753 m)    Weight:   Wt Readings from Last 1 Encounters:  09/19/17 224 lb 3.3 oz (101.7 kg)    Ideal Body Weight:  72.7 kg  BMI:  Body mass index is 33.11 kg/m.  Estimated Nutritional Needs:   Kcal:  2200-2500 kcals  Protein:  125-150 g  Fluid:  1000 mL plus UOP  [employer] MS, RD, LDN, CNSC [phone] Pager  [phone] Weekend/On-Call Pager

## 2017-09-19 NOTE — Progress Notes (Signed)
D/w Neuro  His mental status has worsened today v yesterday  Will start levophed drip and push SBP to 120 to see if that makes any difference  Will need a feeding tube until MS improves  Remo Lipps C. Roxan Hockey, MD [facility] [phone]

## 2017-09-19 NOTE — Progress Notes (Signed)
CT surgery p.m. Rounds  Patient somnolent, comfortable with stable vital signs and oxygen saturation Tolerating hemodialysis with removal of 2 L fluid Head CT scan done today shows no evidence of posterior circulation ischemic event

## 2017-09-19 NOTE — Progress Notes (Signed)
PT Cancellation Note  Patient Details Name: Ernest [name] MRN: [id number] DOB: [birth year]   Cancelled Treatment:    Reason Eval/Treat Not Completed: (P) Medical issues which prohibited therapy(Nurse reports pt with change in mental status. Will cancel treatment today and follow up per plan of care. )  Fransisca Connors, [place] 09/19/2017, 3:01 PM

## 2017-09-19 NOTE — Procedures (Signed)
Patient seen and examined on Hemodialysis. QB 400 mL/ min LUE AVG UF goal 2.5L.  Levophed still available to [place] if needed.  Treatment adjusted as needed.  Madelon Lips MD [facility] pgr [phone] 7:31 AM

## 2017-09-19 NOTE — Progress Notes (Signed)
Reason for consult: Slurred speech  Subjective: Awake, interactive but not answering questions. Following simple commands.   ROS: Unable to obtain due to poor mental status  Examination  Vital signs in last 24 hours: Temp:  [97.6 F (36.4 C)-98 F (36.7 C)] 97.6 F (36.4 C) (12/06 0715) Pulse Rate:  [62-96] 80 (12/06 1045) Resp:  [8-25] 17 (12/06 0800) BP: (78-171)/(14-95) 111/68 (12/06 1045) SpO2:  [97 %-100 %] 100 % (12/06 0800) Weight:  [224 lb 3.3 oz (101.7 kg)-229 lb 4.5 oz (104 kg)] 224 lb 3.3 oz (101.7 kg) (12/06 0715)  General: Not in distress, cooperative CVS: pulse-normal rate and rhythm RS: breathing comfortably Extremities: normal   Neuro: MS: Alert, follows simple commands, not speaking or answering questions CN: pupils equal and reactive, tracking around rooms, face symmetric, tongue midline, normal sensation over face, Motor: 5/5 strength in all 4 extremities  Basic Metabolic Panel: Recent Labs  Lab 09/13/17 0227  09/13/17 2045  09/14/17 0347 09/14/17 1638  09/15/17 0330 09/16/17 0354 09/17/17 0441 09/18/17 0434 09/19/17 0558  NA 132*   < >  --    < > 136  --    < > 133* 132* 131* 135 134*  K 4.0   < >  --    < > 4.3  --    < > 4.1 3.9 4.3 3.7 4.8  CL 93*   < >  --    < > 103  --    < > 96* 95* 91* 93* 95*  CO2 27  --   --   --  23  --   --  24 24 24 28 22   GLUCOSE 141*   < >  --    < > 107*  --    < > 223* 214* 247* 215* 168*  BUN 28*   < >  --    < > 29*  --    < > 22* 31* 23* 19 30*  CREATININE 6.42*   < > 6.24*   < > 6.53* 4.21*   < > 5.67* 7.24* 6.10* 4.75* 6.56*  CALCIUM 8.2*  --   --   --  7.2*  --   --  7.4* 7.5* 7.4* 7.3* 7.6*  MG 2.2  --  2.2  --  2.2 1.9  --   --   --   --   --   --    < > = values in this interval not displayed.    CBC: Recent Labs  Lab 09/15/17 0330 09/16/17 0354 09/17/17 0441 09/18/17 0434 09/19/17 0558  WBC 13.1* 13.5* 14.0* 12.2* 10.3  HGB 9.9* 10.1* 10.0* 10.3* 11.1*  HCT 28.8* 28.8* 28.9* 29.8* 31.8*   MCV 83.2 81.6 81.9 82.3 83.7  PLT 178 206 218 244 205    ASSESSMENT AND PLAN  Possible Subacute ischemic stroke in Posterior Circulation Patient is still alert and following commands but is not answering questions or speaking his morning. Apparently had swallowed pills this mroning around 8.30 AM.R No focal deficits on exam. Unable to obtain MRI. Will get CTA head and neck with contrast to r/o basilar thrombus. Not a candidate for tPA as last known normal 3 days ago.      Recommend #Transthoracic Echo  # Continue ASA , Plavix .  #Start or continue Atorvastatin 80 mg/other high intensity statin # BP goal: above 829 systolic as patient has been hypotensive # HBAIC and Lipid profile # Telemetry monitoring # Frequent neuro  checks, NIHSS # NPO- will need NG tube placed. Swallow assessment  Please page stroke NP  Or  PA  Or MD from 8am -4 pm  as this patient from this time will be  followed by the stroke.   You can look them up on [email or web address]  Password [email or web address]   Maryellen Pile, MD Internal Medicine Teaching Program PGY-3   NEUROHOSPITALIST ADDENDUM Seen and examined the patient this AM. Formulated plan as documented above. Recommendations as above.  Patient has clinically worsened since this morning, according to his nurse. Now from severe dysarthria to anarthric. INO baseline from prior stroke. Still moving all 4 extremities with 4/5 strength. It appears patient may extended stroke this morning. Will need to keep blood pressure 140 of greater.    Karena Addison Ruhee Enck MD [facility] [phone]  If 7pm to 7am, please call on call as listed on AMION.

## 2017-09-19 NOTE — Progress Notes (Signed)
[facility] Progress Note   Assessment/ Plan:    Dialysis: [facility] MWF 4h 42mn  95.5kg   2/2 bath  LUA AVG  Hep none -venofer 50 q wk -calc 0.75 w HD   1. Severe CAD - sp CABG 12/1.  Milrinone d/c'd and levophed weaned 2. AMS - multifact.  CT head no new abnormality, neuro c/s, rec MRI/ MRA 3. ESRD HD MWF.  Serial HD due to vol overload, wasn't able to do yesterday as noted below, will do HD today and likely tomorrow 4. CM EF 45-50% 5. Vol overload - still vol up, serial HD to improve vol status 6. Hypotension - resolved with incr pacer setting 7. Anemia of CKD - Hb steady mid 10s-11. 8. DM w mult complications 9. Hx CVA (x 5 approx)- CT head negative 12/4   Subjective:    Wasn't able to HD yesterday due to facilities issues with outlets in room.  For first today.  Seen by neuro yesterday, MRI/ MRA recommended   Objective:   BP 137/84   Pulse 80   Temp 97.7 F (36.5 C) (Oral)   Resp 16   Ht _0  (1.753 m)   Wt 104 kg (229 lb 4.5 oz)   SpO2 100%   BMI 33.86 kg/m   Physical Exam: GEN: Confused, lying flat in bed.  A little more attentive today but still not at baseline PULM: bibasilar crackles improved CV: RRR no mrg, sternal scar intact ABD obese soft ntnd EXT: LE edema improved, still upper extremity edema L > R LUA AVG +bruit NEURO: follows commands, a little more responsive    Labs: BMET Recent Labs  Lab 09/12/17 1023 09/13/17 0227  09/13/17 2106 09/14/17 0347 09/14/17 1638 09/14/17 1645 09/15/17 0330 09/16/17 0354 09/17/17 0441 09/18/17 0434  NA 130* 132*   < > 138 136  --  137 133* 132* 131* 135  K 4.6 4.0   < > 4.4 4.3  --  3.5 4.1 3.9 4.3 3.7  CL 93* 93*   < > 102 103  --  97* 96* 95* 91* 93*  CO2 21* 27  --   --  23  --   --  _1 GLUCOSE 267* 141*   < > 172* 107*  --  125* 223* 214* 247* 215*  BUN 50* 28*   < > 27* 29*  --  16 22* 31* 23* 19  CREATININE 9.15* 6.42*   < > 5.80* 6.53* 4.21* 3.90* 5.67* 7.24* 6.10* 4.75*   CALCIUM 8.3* 8.2*  --   --  7.2*  --   --  7.4* 7.5* 7.4* 7.3*  PHOS 5.5*  --   --   --   --   --   --   --   --   --   --    < > = values in this interval not displayed.   CBC Recent Labs  Lab 09/16/17 0354 09/17/17 0441 09/18/17 0434 09/19/17 0558  WBC 13.5* 14.0* 12.2* 10.3  HGB 10.1* 10.0* 10.3* 11.1*  HCT 28.8* 28.9* 29.8* 31.8*  MCV 81.6 81.9 82.3 83.7  PLT 206 218 244 205    _2 @ Medications:    . amitriptyline  25 mg Oral QHS  . aspirin EC  325 mg Oral Daily   Or  . aspirin  324 mg Per Tube Daily  . atorvastatin  80 mg Oral q1800  . bisacodyl  10 mg Oral Daily  Or  . bisacodyl  10 mg Rectal Daily  . calcitRIOL  0.75 mcg Oral Q M,W,F-HD  . chlorhexidine gluconate (MEDLINE KIT)  15 mL Mouth Rinse BID  . Chlorhexidine Gluconate Cloth  6 each Topical Q0600  . cinacalcet  120 mg Oral Q supper  . clopidogrel  75 mg Oral Daily  . docusate sodium  200 mg Oral Daily  . enoxaparin (LOVENOX) injection  30 mg Subcutaneous Q24H  . Influenza vac split quadrivalent PF  0.5 mL Intramuscular Tomorrow-1000  . insulin aspart  0-15 Units Subcutaneous TID WC  . insulin aspart  0-5 Units Subcutaneous QHS  . levETIRAcetam  1,000 mg Oral Daily  . levETIRAcetam  500 mg Oral Q M,W,F-HD  . mouth rinse  15 mL Mouth Rinse QID  . metoprolol tartrate  12.5 mg Oral BID   Or  . metoprolol tartrate  12.5 mg Per Tube BID  . multivitamin  1 tablet Oral QHS  . pantoprazole  40 mg Oral Daily  . risperiDONE  0.5 mg Oral QHS  . sevelamer carbonate  2,400 mg Oral TID WC  . sodium chloride flush  3 mL Intravenous Q12H     Madelon Lips MD [facility] pgr [phone] 09/19/2017, 7:27 AM

## 2017-09-19 NOTE — Progress Notes (Signed)
ANTICOAGULATION CONSULT NOTE  Pharmacy Consult for heparin  Indication: atrial fibrillation  [name]  Patient Measurements: Height: _0  (175.3 cm) Weight: 224 lb 13.9 oz (102 kg) IBW/kg (Calculated) : 70.7 Heparin Dosing Weight: 92kg  Vital Signs: Temp: 97.2 F (36.2 C) (12/06 1552) Temp Source: Oral (12/06 1552) BP: 108/60 (12/06 2100) Pulse Rate: 80 (12/06 2100)  Labs: Recent Labs    09/17/17 0441 09/18/17 0434 09/19/17 0558 09/19/17 1945 09/19/17 2151  HGB 10.0* 10.3* 11.1*  --   --   HCT 28.9* 29.8* 31.8*  --   --   PLT 218 244 205  --   --   HEPARINUNFRC  --   --   --  >2.20* 0.32  CREATININE 6.10* 4.75* 6.56*  --   --     Estimated Creatinine Clearance: 12.5 mL/min (A) (by C-G formula based on SCr of 6.56 mg/dL (H)).   Medical History: Past Medical History:  Diagnosis Date  . Abnormal stress test    s/p cath November 2013 with modest disease involving the ostial left main, proximal LAD, proximal RCA - do not appear to be hemodynamically signficant; mild LV dysfunction  . Bacteremia   . Chronic systolic CHF (congestive heart failure) ([place])   . COPD (chronic obstructive pulmonary disease) ([place])   . Coronary artery disease    per cath report 2013  . Ejection fraction < 50%   . Erectile dysfunction    penile implant  . ESRD (end stage renal disease) ([place])    Started HD in [place] in 2009, ESRD was due to DM. Moved to [facility] in Dec 2009 and now gets dialysis at [facility] on a MWF schedule.     . Gout   . Gout    Hx: of  . Hepatitis C   . Hyperlipidemia   . Hypertension   . Obesity   . Retinopathy   . Shortness of breath    Hx: of with exertion  . Stroke ([place])   . Type II or unspecified type diabetes mellitus without mention of complication, not stated as uncontrolled    adult onset    Medications:  Scheduled:  . amitriptyline  25 mg Oral QHS  . aspirin  81 mg Oral Daily  . atorvastatin  80 mg Oral q1800  . bisacodyl  10 mg Oral  Daily   Or  . bisacodyl  10 mg Rectal Daily  . calcitRIOL  0.75 mcg Oral Q M,W,F-HD  . chlorhexidine gluconate (MEDLINE KIT)  15 mL Mouth Rinse BID  . Chlorhexidine Gluconate Cloth  6 each Topical Q0600  . cinacalcet  120 mg Oral Q supper  . clopidogrel  75 mg Oral Daily  . docusate sodium  200 mg Oral Daily  . feeding supplement (PRO-STAT SUGAR FREE 64)  30 mL Per Tube BID  . Influenza vac split quadrivalent PF  0.5 mL Intramuscular Tomorrow-1000  . insulin aspart  0-15 Units Subcutaneous TID WC  . insulin aspart  0-5 Units Subcutaneous QHS  . iopamidol      . levETIRAcetam  1,000 mg Oral Daily  . levETIRAcetam  500 mg Oral Q M,W,F-HD  . mouth rinse  15 mL Mouth Rinse QID  . metoprolol tartrate  12.5 mg Oral BID   Or  . metoprolol tartrate  12.5 mg Per Tube BID  . multivitamin  1 tablet Oral QHS  . pantoprazole  40 mg Oral Daily  . risperiDONE  0.5 mg Oral QHS  .  sevelamer carbonate  2,400 mg Oral TID WC  . sodium chloride flush  3 mL Intravenous Q12H    Assessment: 71 yo male s/p CABG and MV repair on 11/30 and now noted with afib.  Pharmacy consulted to dose heparin (no bolus).  Heparin level this PM is 0.32 - therapeutic on 1250 units/hr. (Prior level of >2.20 was an error).  No issues with lines. No bleeding.   Goal of Therapy:  Heparin level 0.3-0.7 units/ml Monitor platelets by anticoagulation protocol: Yes   Plan:  Increase heparin to 1300 units/hr to keep in goal.  Recheck with AM labs.  Heparin level and CBC daily while on therapy.   Sloan Leiter, PharmD, BCPS, BCCCP Clinical Pharmacist Clinical phone 09/19/2017 until [phone] After hours, please call #[phone]  09/19/2017 10:29 PM

## 2017-09-19 NOTE — Progress Notes (Signed)
6 Days Post-Op Procedure(s) (LRB): CORONARY ARTERY BYPASS GRAFTING (CABG) times four LIMA  to LAD, SVG sequentially to OM and RAMUS Intermediate and SVG to Acute Marginal (N/A) TRANSESOPHAGEAL ECHOCARDIOGRAM (TEE) (N/A) MITRAL VALVE REPAIR (MVR) (N/A) Subjective: Awake but less interactive today. On HD currently  Objective: Vital signs in last 24 hours: Temp:  [97.6 F (36.4 C)-98 F (36.7 C)] 97.6 F (36.4 C) (12/06 0715) Pulse Rate:  [62-96] 62 (12/06 0930) Cardiac Rhythm: Atrial paced;Ventricular paced (12/06 0800) Resp:  [8-25] 17 (12/06 0800) BP: (78-171)/(14-95) 105/55 (12/06 0930) SpO2:  [97 %-100 %] 100 % (12/06 0800) Weight:  [224 lb 3.3 oz (101.7 kg)-229 lb 4.5 oz (104 kg)] 224 lb 3.3 oz (101.7 kg) (12/06 0715)  Hemodynamic parameters for last 24 hours:    Intake/Output from previous day: 12/05 0701 - 12/06 0700 In: 817.6 [P.O.:125; I.V.:692.6] Out: 0  Intake/Output this shift: Total I/O In: 436.7 [I.V.:436.7] Out: -   General appearance: alert, cooperative and no distress Neurologic: intact Heart: regular rate and rhythm Lungs: clear to auscultation bilaterally Abdomen: normal findings: soft, non-tender  Lab Results: Recent Labs    09/18/17 0434 09/19/17 0558  WBC 12.2* 10.3  HGB 10.3* 11.1*  HCT 29.8* 31.8*  PLT 244 205   BMET:  Recent Labs    09/18/17 0434 09/19/17 0558  NA 135 134*  K 3.7 4.8  CL 93* 95*  CO2 28 22  GLUCOSE 215* 168*  BUN 19 30*  CREATININE 4.75* 6.56*  CALCIUM 7.3* 7.6*    PT/INR: No results for input(s): LABPROT, INR in the last 72 hours. ABG    Component Value Date/Time   PHART 7.427 09/15/2017 0320   HCO3 24.3 09/15/2017 0320   TCO2 26 09/14/2017 1645   ACIDBASEDEF 4.0 (H) 09/14/2017 0153   O2SAT 41.5 09/19/2017 0642   CBG (last 3)  Recent Labs    09/18/17 1619 09/18/17 2129 09/19/17 0749  GLUCAP 130* 189* 146*    Assessment/Plan: S/P Procedure(s) (LRB): CORONARY ARTERY BYPASS GRAFTING (CABG) times  four LIMA  to LAD, SVG sequentially to OM and RAMUS Intermediate and SVG to Acute Marginal (N/A) TRANSESOPHAGEAL ECHOCARDIOGRAM (TEE) (N/A) MITRAL VALVE REPAIR (MVR) (N/A) -  NEURO- less responsive today than he was yesterday afternoon. Continue to reassess.  Appreciate Neurology's input  He cannot have a MR with pacing wires in place. They are still in use  Continue PT OT as able  CV- co-ox down significantly this AM. Unsure of accuracy. Rhythm, BP, etc unchanged  In atrial flutter under pacer- on amiodarone. Given MV repair and A flutter will start heparin drip  RESP_ repeat CXR tomorrow  RENAL- on HD  ENDO- CBG better   LOS: 11 days    Melrose Nakayama 09/19/2017

## 2017-09-19 NOTE — Progress Notes (Signed)
ANTICOAGULATION CONSULT NOTE  Pharmacy Consult for heparin  Indication: atrial fibrillation  [name]  Patient Measurements: Height: _0  (175.3 cm) Weight: 224 lb 3.3 oz (101.7 kg) IBW/kg (Calculated) : 70.7 Heparin Dosing Weight: 92kg  Vital Signs: Temp: 97.6 F (36.4 C) (12/06 0715) Temp Source: Oral (12/06 0715) BP: 119/66 (12/06 0945) Pulse Rate: 80 (12/06 0945)  Labs: Recent Labs    09/17/17 0441 09/18/17 0434 09/19/17 0558  HGB 10.0* 10.3* 11.1*  HCT 28.9* 29.8* 31.8*  PLT 218 244 205  CREATININE 6.10* 4.75* 6.56*    Estimated Creatinine Clearance: 12.5 mL/min (A) (by C-G formula based on SCr of 6.56 mg/dL (H)).   Medical History: Past Medical History:  Diagnosis Date  . Abnormal stress test    s/p cath November 2013 with modest disease involving the ostial left main, proximal LAD, proximal RCA - do not appear to be hemodynamically signficant; mild LV dysfunction  . Bacteremia   . Chronic systolic CHF (congestive heart failure) ([place])   . COPD (chronic obstructive pulmonary disease) ([place])   . Coronary artery disease    per cath report 2013  . Ejection fraction < 50%   . Erectile dysfunction    penile implant  . ESRD (end stage renal disease) ([place])    Started HD in [place] in 2009, ESRD was due to DM. Moved to [facility] in Dec 2009 and now gets dialysis at [facility] on a MWF schedule.     . Gout   . Gout    Hx: of  . Hepatitis C   . Hyperlipidemia   . Hypertension   . Obesity   . Retinopathy   . Shortness of breath    Hx: of with exertion  . Stroke ([place])   . Type II or unspecified type diabetes mellitus without mention of complication, not stated as uncontrolled    adult onset    Medications:  Scheduled:  . amitriptyline  25 mg Oral QHS  . aspirin  81 mg Oral Daily  . atorvastatin  80 mg Oral q1800  . bisacodyl  10 mg Oral Daily   Or  . bisacodyl  10 mg Rectal Daily  . calcitRIOL  0.75 mcg Oral Q M,W,F-HD  . chlorhexidine gluconate  (MEDLINE KIT)  15 mL Mouth Rinse BID  . Chlorhexidine Gluconate Cloth  6 each Topical Q0600  . cinacalcet  120 mg Oral Q supper  . clopidogrel  75 mg Oral Daily  . docusate sodium  200 mg Oral Daily  . Influenza vac split quadrivalent PF  0.5 mL Intramuscular Tomorrow-1000  . insulin aspart  0-15 Units Subcutaneous TID WC  . insulin aspart  0-5 Units Subcutaneous QHS  . levETIRAcetam  1,000 mg Oral Daily  . levETIRAcetam  500 mg Oral Q M,W,F-HD  . mouth rinse  15 mL Mouth Rinse QID  . metoprolol tartrate  12.5 mg Oral BID   Or  . metoprolol tartrate  12.5 mg Per Tube BID  . multivitamin  1 tablet Oral QHS  . pantoprazole  40 mg Oral Daily  . risperiDONE  0.5 mg Oral QHS  . sevelamer carbonate  2,400 mg Oral TID WC  . sodium chloride flush  3 mL Intravenous Q12H    Assessment: 71 yo male s/p CABG and MV repair on 11/30 and now noted with afib.  Pharmacy consulted to dose heparin (no bolus) -Hg= 11.1, plt= 205 -enoxaparin 5m given at ~ 9am today  Goal of Therapy:  Heparin level 0.3-0.7 units/ml Monitor platelets by anticoagulation protocol: Yes   Plan:  -Start heparin at 1250 units/hr -Heparin level in 8 hours and daily wth CBC daily  Hildred Laser, Pharm D 09/19/2017 9:56 AM

## 2017-09-19 NOTE — Progress Notes (Signed)
PT Cancellation Note  Patient Details Name: Ernest [name] MRN: [id number] DOB: 04-16-1946   Cancelled Treatment:    Reason Eval/Treat Not Completed: (P) Patient at procedure or test/unavailable(Pt receiving HD; will return in pm.)  [name], [place] 09/19/2017, 10:47 AM

## 2017-09-20 ENCOUNTER — Inpatient Hospital Stay (HOSPITAL_COMMUNITY): Payer: Medicare Other

## 2017-09-20 DIAGNOSIS — I6340 Cerebral infarction due to embolism of unspecified cerebral artery: Secondary | ICD-10-CM

## 2017-09-20 DIAGNOSIS — I120 Hypertensive chronic kidney disease with stage 5 chronic kidney disease or end stage renal disease: Secondary | ICD-10-CM

## 2017-09-20 LAB — BASIC METABOLIC PANEL
ANION GAP: 18 — AB (ref 5–15)
BUN: 20 mg/dL (ref 6–20)
CHLORIDE: 93 mmol/L — AB (ref 101–111)
CO2: 23 mmol/L (ref 22–32)
Calcium: 7.7 mg/dL — ABNORMAL LOW (ref 8.9–10.3)
Creatinine, Ser: 5.19 mg/dL — ABNORMAL HIGH (ref 0.61–1.24)
GFR calc non Af Amer: 10 mL/min — ABNORMAL LOW (ref >60)
GFR, EST AFRICAN AMERICAN: 12 mL/min — AB (ref >60)
Glucose, Bld: 165 mg/dL — ABNORMAL HIGH (ref 65–99)
POTASSIUM: 4 mmol/L (ref 3.5–5.1)
SODIUM: 134 mmol/L — AB (ref 135–145)

## 2017-09-20 LAB — GLUCOSE, CAPILLARY
GLUCOSE-CAPILLARY: 167 mg/dL — AB (ref 65–99)
GLUCOSE-CAPILLARY: 85 mg/dL (ref 65–99)
Glucose-Capillary: 153 mg/dL — ABNORMAL HIGH (ref 65–99)
Glucose-Capillary: 178 mg/dL — ABNORMAL HIGH (ref 65–99)
Glucose-Capillary: 127 mg/dL — ABNORMAL HIGH (ref 65–99)

## 2017-09-20 LAB — CBC
HEMATOCRIT: 29.8 % — AB (ref 39.0–52.0)
HEMOGLOBIN: 10.2 g/dL — AB (ref 13.0–17.0)
MCH: 28.5 pg (ref 26.0–34.0)
MCHC: 34.2 g/dL (ref 30.0–36.0)
MCV: 83.2 fL (ref 78.0–100.0)
PLATELETS: 256 10*3/uL (ref 150–400)
RBC: 3.58 MIL/uL — AB (ref 4.22–5.81)
RDW: 15.3 % (ref 11.5–15.5)
WBC: 15 10*3/uL — AB (ref 4.0–10.5)

## 2017-09-20 LAB — COOXEMETRY PANEL
Carboxyhemoglobin: 1.5 % (ref 0.5–1.5)
METHEMOGLOBIN: 0.9 % (ref 0.0–1.5)
O2 Saturation: 75 %
TOTAL HEMOGLOBIN: 10.4 g/dL — AB (ref 12.0–16.0)

## 2017-09-20 LAB — PROTIME-INR
INR: 1.41
INR: 1.4
PROTHROMBIN TIME: 17 s — AB (ref 11.4–15.2)
Prothrombin Time: 17.2 seconds — ABNORMAL HIGH (ref 11.4–15.2)

## 2017-09-20 LAB — AMMONIA: AMMONIA: 18 umol/L (ref 9–35)

## 2017-09-20 LAB — MAGNESIUM
MAGNESIUM: 2 mg/dL (ref 1.7–2.4)
Magnesium: 2 mg/dL (ref 1.7–2.4)

## 2017-09-20 LAB — HEPARIN LEVEL (UNFRACTIONATED): HEPARIN UNFRACTIONATED: 0.62 [IU]/mL (ref 0.30–0.70)

## 2017-09-20 LAB — PHOSPHORUS
PHOSPHORUS: 2.6 mg/dL (ref 2.5–4.6)
Phosphorus: 4.1 mg/dL (ref 2.5–4.6)

## 2017-09-20 MED ORDER — DOXERCALCIFEROL 4 MCG/2ML IV SOLN
2.0000 ug | INTRAVENOUS | Status: DC
  Administered 2017-09-20 – 2017-10-02 (×5): 2 ug via INTRAVENOUS
  Filled 2017-09-20 (×7): qty 2

## 2017-09-20 NOTE — Progress Notes (Signed)
Dialysis treatment completed.  3000 mL ultrafiltrated and net fluid removal 2500 mL.    Patient status unchanged. Lung sounds diminished to ausculation in all fields. BUE 3+ pitting edema. Cardiac: A/V paced.  Disconnected lines and removed needles.  Pressure held for 15 minutes and band aid/gauze dressing applied.  Report given to bedside RN, Judson Roch.

## 2017-09-20 NOTE — Progress Notes (Signed)
ANTICOAGULATION CONSULT NOTE  Pharmacy Consult for heparin  Indication: atrial fibrillation  [name]  Patient Measurements: Height: '5\' 9"'  (175.3 cm) Weight: 220 lb 10.9 oz (100.1 kg) IBW/kg (Calculated) : 70.7 Heparin Dosing Weight: 92kg  Vital Signs: Temp: 97.8 F (36.6 C) (12/07 1038) Temp Source: Oral (12/07 0746) BP: 130/59 (12/07 1245) Pulse Rate: 81 (12/07 1245)  Labs: Recent Labs    09/18/17 0434 09/19/17 0558 09/19/17 1945 09/19/17 2151 09/20/17 0400 09/20/17 1106 09/20/17 1107  HGB 10.3* 11.1*  --   --  10.2*  --   --   HCT 29.8* 31.8*  --   --  29.8*  --   --   PLT 244 205  --   --  256  --   --   LABPROT  --   --   --   --   --  17.2*  --   INR  --   --   --   --   --  1.41  --   HEPARINUNFRC  --   --  >2.20* 0.32  --   --  0.62  CREATININE 4.75* 6.56*  --   --  5.19*  --   --     Estimated Creatinine Clearance: 15.7 mL/min (A) (by C-G formula based on SCr of 5.19 mg/dL (H)).   Medical History: Past Medical History:  Diagnosis Date  . Abnormal stress test    s/p cath November 2013 with modest disease involving the ostial left main, proximal LAD, proximal RCA - do not appear to be hemodynamically signficant; mild LV dysfunction  . Bacteremia   . Chronic systolic CHF (congestive heart failure) ([place])   . COPD (chronic obstructive pulmonary disease) ([place])   . Coronary artery disease    per cath report 2013  . Ejection fraction < 50%   . Erectile dysfunction    penile implant  . ESRD (end stage renal disease) ([place])    Started HD in [place] in 2009, ESRD was due to DM. Moved to [facility] in Dec 2009 and now gets dialysis at [facility] on a MWF schedule.     . Gout   . Gout    Hx: of  . Hepatitis C   . Hyperlipidemia   . Hypertension   . Obesity   . Retinopathy   . Shortness of breath    Hx: of with exertion  . Stroke ([place])   . Type II or unspecified type diabetes mellitus without mention of complication, not stated as uncontrolled    adult onset    Medications:  Scheduled:  . amitriptyline  25 mg Oral QHS  . aspirin  81 mg Oral Daily  . atorvastatin  80 mg Oral q1800  . bisacodyl  10 mg Oral Daily   Or  . bisacodyl  10 mg Rectal Daily  . calcitRIOL  0.75 mcg Oral Q M,W,F-HD  . chlorhexidine gluconate (MEDLINE KIT)  15 mL Mouth Rinse BID  . cinacalcet  120 mg Oral Q supper  . clopidogrel  75 mg Oral Daily  . docusate sodium  200 mg Oral Daily  . feeding supplement (PRO-STAT SUGAR FREE 64)  30 mL Per Tube BID  . Influenza vac split quadrivalent PF  0.5 mL Intramuscular Tomorrow-1000  . insulin aspart  0-15 Units Subcutaneous TID WC  . insulin aspart  0-5 Units Subcutaneous QHS  . levETIRAcetam  1,000 mg Oral Daily  . levETIRAcetam  500 mg Oral Q M,W,F-HD  .  mouth rinse  15 mL Mouth Rinse QID  . metoprolol tartrate  12.5 mg Oral BID   Or  . metoprolol tartrate  12.5 mg Per Tube BID  . multivitamin  1 tablet Oral QHS  . pantoprazole  40 mg Oral Daily  . risperiDONE  0.5 mg Oral QHS  . sevelamer carbonate  2,400 mg Oral TID WC  . sodium chloride flush  3 mL Intravenous Q12H    Assessment: 71 yo male s/p CABG and MV repair on 11/30 and now noted with afib.  Pharmacy consulted to dose heparin (no bolus). -heparin level at goal on 1300 units/hr  Goal of Therapy:  Heparin level 0.3-0.7 units/ml Monitor platelets by anticoagulation protocol: Yes   Plan:  -No heparin changes needed -Daily heparin level and CBC -Will follow oral anticoagulation plans  Hildred Laser, Pharm D 09/20/2017 1:04 PM

## 2017-09-20 NOTE — Progress Notes (Signed)
Arrived to patient room 2H-17.  Reviewed treatment plan and this RN agrees.  Report received from bedside RN, Judson Roch.  Consent verified.  Patient responds to voice, oriented to self and place. Lung sounds diminished to ausculation in all fields. BUE 3+ pitting edema. Cardiac: A/V paced.  Prepped LUAVG with alcohol and cannulated with two 15 gauge needles.  Pulsation of blood noted.  Flushed access well with saline per protocol.  Connected and secured lines and initiated tx at 1114.  UF goal of 3000 mL and net fluid removal of 2500 mL.  Will continue to monitor.

## 2017-09-20 NOTE — Progress Notes (Signed)
Cortrak Tube Team Note:  Consult received to place a Cortrak feeding tube.   A 10 F Cortrak tube was placed in the left nare and secured with a nasal bridle at 78 cm. Per the Cortrak monitor reading the tube tip is post-pyloric.   No x-ray is required. RN may begin using tube.   If the tube becomes dislodged please keep the tube and contact the Cortrak team at [email or web address] ([email or web address]) for replacement.  If after hours and replacement cannot be delayed, place a NG tube and confirm placement with an abdominal x-ray.   Kerman Passey MS, RD, [place], [place] [phone] Pager  [phone] Weekend/On-Call Pager

## 2017-09-20 NOTE — Progress Notes (Signed)
STROKE TEAM PROGRESS NOTE   SUBJECTIVE (INTERVAL HISTORY) His wife is at the bedside. Patient is having HD at bedside. Still very lethargic and severe dysarthria but able to follow simple commands. On heparin drip now.   OBJECTIVE Temp:  [97.5 F (36.4 C)-98 F (36.7 C)] 98 F (36.7 C) (12/07 1900) Pulse Rate:  [68-88] 86 (12/07 2100) Cardiac Rhythm: Atrial paced;Ventricular paced (12/07 2000) Resp:  [9-19] 14 (12/07 2100) BP: (107-170)/(44-126) 136/53 (12/07 2100) SpO2:  [92 %-100 %] 100 % (12/07 2100) Weight:  [215 lb 2.7 oz (97.6 kg)-220 lb 10.9 oz (100.1 kg)] 215 lb 2.7 oz (97.6 kg) (12/07 1444)  Recent Labs  Lab 09/20/17 0455 09/20/17 0743 09/20/17 1112 09/20/17 1621 09/20/17 1949  GLUCAP 167* 153* 178* 85 127*   Recent Labs  Lab 09/14/17 1638  09/16/17 0354 09/17/17 0441 09/18/17 0434 09/19/17 0558 09/19/17 1415 09/19/17 1945 09/20/17 0400 09/20/17 1628  NA  --    < > 132* 131* 135 134*  --   --  134*  --   K  --    < > 3.9 4.3 3.7 4.8  --   --  4.0  --   CL  --    < > 95* 91* 93* 95*  --   --  93*  --   CO2  --    < > 24 24 28 22   --   --  23  --   GLUCOSE  --    < > 214* 247* 215* 168*  --   --  165*  --   BUN  --    < > 31* 23* 19 30*  --   --  20  --   CREATININE 4.21*   < > 7.24* 6.10* 4.75* 6.56*  --   --  5.19*  --   CALCIUM  --    < > 7.5* 7.4* 7.3* 7.6*  --   --  7.7*  --   MG 1.9  --   --   --   --   --  1.9 2.0 2.0 2.0  PHOS  --   --   --   --   --   --  3.0 3.6 4.1 2.6   < > = values in this interval not displayed.   Recent Labs  Lab 09/15/17 0330  AST 44*  ALT 23  ALKPHOS 44  BILITOT 0.5  PROT 5.1*  ALBUMIN 2.5*   Recent Labs  Lab 09/16/17 0354 09/17/17 0441 09/18/17 0434 09/19/17 0558 09/20/17 0400  WBC 13.5* 14.0* 12.2* 10.3 15.0*  HGB 10.1* 10.0* 10.3* 11.1* 10.2*  HCT 28.8* 28.9* 29.8* 31.8* 29.8*  MCV 81.6 81.9 82.3 83.7 83.2  PLT 206 218 244 205 256   No results for input(s): CKTOTAL, CKMB, CKMBINDEX, TROPONINI in the  last 168 hours. Recent Labs    09/20/17 1106 09/20/17 1107  LABPROT 17.2* 17.0*  INR 1.41 1.40   No results for input(s): COLORURINE, LABSPEC, PHURINE, GLUCOSEU, HGBUR, BILIRUBINUR, KETONESUR, PROTEINUR, UROBILINOGEN, NITRITE, LEUKOCYTESUR in the last 72 hours.  Invalid input(s): APPERANCEUR     Component Value Date/Time   CHOL 116 10/28/2016 0442   TRIG 92 10/28/2016 0442   HDL 48 10/28/2016 0442   CHOLHDL 2.4 10/28/2016 0442   VLDL 18 10/28/2016 0442   LDLCALC 50 10/28/2016 0442   Lab Results  Component Value Date   HGBA1C 9.1 (H) 09/08/2017   No results found for: LABOPIA, COCAINSCRNUR, [place], [place], THCU,  LABBARB  No results for input(s): ETH in the last 168 hours.  I have personally reviewed the radiological images below and agree with the radiology interpretations.  Ct Angio Head and neck W Or Wo Contrast 09/19/2017 IMPRESSION: Stable appearing intracranial and extracranial atherosclerotic disease. No flow-limiting stenosis or occlusion involving the carotid, vertebral, or basilar arteries. No abnormality is seen to suggest acute posterior circulation ischemic change. Electronically Signed   By: Staci Righter M.D.   On: 09/19/2017 12:41   Ct Head Wo Contrast 09/17/2017 IMPRESSION: 1. No acute intracranial process. 2. Stable mild parenchymal brain volume loss. 3. Stable mild-to-moderate chronic small vessel ischemic disease and old small cerebellar infarcts.   TEE 09/13/17  Septum: No Patent Foramen Ovale present.  Left atrium: Patent foramen ovale not present.  Left atrium: Cavity is mildly to moderately dilated. Systolic blunting of pulmonary venous inflow. Spontaneous echo contrast.  Aortic valve: The valve is trileaflet. Mild valve calcification present. Mildly decreased leaflet separation. No stenosis. No regurgitation.  Mitral valve: Dilated mitral annulus. No leaflet thickening and calcification present. Moderate to severe regurgitation.  Right  ventricle: Normal wall thickness. Cavity is mildly to moderately dilated. Moderately reduced systolic function. RV systolic function is decreased moderately Abnormal tricuspid annular plane systolic excursion (TAPSE) < 1.7cm. No thrombus present. No mass present. Catheter present in the ventricle.  Right atrium: Cavity is mildly dilated. Interatrial septum bows to the left, consistent with elevated right atrial pressure.  Tricuspid valve: Valve has a dilated annulus. Mild to moderate regurgitation.  TTE 09/08/17 - Left ventricle: Inferobasal and posterior lateral hypokinesis EF   similar to echo from 2016. The cavity size was normal. Wall   thickness was normal. Systolic function was mildly reduced. The   estimated ejection fraction was in the range of 45% to 50%. - Mitral valve: Calcified annulus. Mildly thickened leaflets .   There was mild regurgitation. - Left atrium: The atrium was moderately dilated. - Pulmonary arteries: PA peak pressure: 50 mm Hg (S).   PHYSICAL EXAM  Temp:  [97.5 F (36.4 C)-98 F (36.7 C)] 98 F (36.7 C) (12/07 1900) Pulse Rate:  [68-88] 86 (12/07 2100) Resp:  [9-19] 14 (12/07 2100) BP: (107-170)/(44-126) 136/53 (12/07 2100) SpO2:  [92 %-100 %] 100 % (12/07 2100) Weight:  [215 lb 2.7 oz (97.6 kg)-220 lb 10.9 oz (100.1 kg)] 215 lb 2.7 oz (97.6 kg) (12/07 1444)  General - obese, well developed, very lethargic  Ophthalmologic - fundi not visualized due to noncooperation.  Cardiovascular - Regular rate and rhythm, no afib.  Neuro - awake, eyes open, but very lethargic. Able to follow limited simple commands, but not able to follow all commands. Severely dysarthric and words intelligible. Able to tell me his name and age and place, but not to time or people. PERRL, blinking to visual threat bilaterally, however, incomplete abduction and adduction bilaterally. No significant facial droop, tongue midline in mouth. BUE 3+/5 bilaterally with asterixis, BLE 3-/5  proximal symmetrical, distal 5/5. DTR 1+ and no babinski. Sensation, coordination not cooperative and gait not tested   ASSESSMENT/PLAN Ernest [name] is a 71 y.o. male with history of CHF, CAD, ESRD on HD, HTN, HLD, obesity, DM, partial seizure on Keppra, penile implant admitted for CABG procedure.  Became lethargic, confusion, altered mental status 2 days after CABG procedure.    Encephalopathy - likely due to pulmonary edema, leukocytosis and deconditioning. Stroke still in DDx given aflutter and multiple vascular risk factors but no focal deficit  and CT neg  Resultant lethargy, severe dysarthria, disorientation, asterixis  MRI not able to perform due to penile implant  CT head no acute abnormalities  CTA head and neck - b/l siphon, right VA origin severe athero/stenosis, right ICA proximal athero  2D Echo  EF 45-50%  LDL 10/2016 = 50  HgbA1c 08/2017 = 9.1  Heparin IV for VTE prophylaxis  Diet NPO time specified   aspirin 81 mg daily and clopidogrel 75 mg daily prior to admission, now on aspirin 81 mg daily, clopidogrel 75 mg daily and heparin IV. Anticoagulation needed due to aflutter.  Patient counseled to be compliant with his antithrombotic medications  Ongoing aggressive stroke risk factor management  Therapy recommendations:  pending  Disposition:  Pending  Aflutter  Post CABG procedure  Agree with anticoagulation with heparin IV  CVTS on board   CAD s/p CABG  09/14/17 CABG  CVTS on board  Hx of stroke  06/2015 diplopia carotid Doppler negative, EF 45-50%.  CT negative. LDL 36 and A1c 7.7. put on aspirin and Plavix.  10/2016, left leg weakness CT negative, carotid Doppler negative, LDL 50 and A1c 8.7, concerning for hypotension related  Diabetes  HgbA1c 9.1 in 08/2017 goal < 7.0  Uncontrolled  CBG monitoring  SSI  DM education and close PCP follow up  Hypertension Stable  Long term BP goal Normotensive  Hyperlipidemia  Home meds:  Lipitor 40  LDL 58 in 10/2016, goal < 70  Now on Lipitor 80  Continue statin at discharge  Other Stroke Risk Factors  Advanced age  Obesity, Body mass index is 31.77 kg/m.   Other Active Problems  ESRD on HD  Partial seizure on Keppra  Penile implant  Follows with Dr. Tomi Likens at the [facility] day # 12   This patient is critically ill due to s/p CABG, pulmonary edema, encephalopathy and at significant risk of neurological worsening, death form heart failure, seizure, stroke. This patient's care requires constant monitoring of vital signs, hemodynamics, respiratory and cardiac monitoring, review of multiple databases, neurological assessment, discussion with family, other specialists and medical decision making of high complexity. I spent 35 minutes of neurocritical care time in the care of this patient.   Rosalin Hawking, MD PhD Stroke Neurology 09/20/2017 9:30 PM    To contact Stroke Continuity provider, please refer to [email or web address] After hours, contact General Neurology

## 2017-09-20 NOTE — Progress Notes (Signed)
7 Days Post-Op Procedure(s) (LRB): CORONARY ARTERY BYPASS GRAFTING (CABG) times four LIMA  to LAD, SVG sequentially to OM and RAMUS Intermediate and SVG to Acute Marginal (N/A) TRANSESOPHAGEAL ECHOCARDIOGRAM (TEE) (N/A) MITRAL VALVE REPAIR (MVR) (N/A) Subjective: A little less somnolent this morning Answers simple questions yes or no, knows he is in hospital  Objective: Vital signs in last 24 hours: Temp:  [97.2 F (36.2 C)-97.9 F (36.6 C)] 97.9 F (36.6 C) (12/06 2300) Pulse Rate:  [62-86] 80 (12/07 0700) Cardiac Rhythm: Atrial paced;Ventricular paced (12/07 0400) Resp:  [8-20] 12 (12/07 0700) BP: (96-171)/(47-126) 158/68 (12/07 0700) SpO2:  [97 %-100 %] 100 % (12/07 0700) Weight:  [220 lb 10.9 oz (100.1 kg)-224 lb 13.9 oz (102 kg)] 220 lb 10.9 oz (100.1 kg) (12/07 0500)  Hemodynamic parameters for last 24 hours:    Intake/Output from previous day: 12/06 0701 - 12/07 0700 In: 1334.5 [I.V.:1334.5] Out: 2500  Intake/Output this shift: No intake/output data recorded.  General appearance: no distress and slowed mentation Neurologic: moves all 4 to command, no blink, speech slightly better today Heart: regular rate and rhythm and paced Lungs: diminished breath sounds bibasilar Abdomen: normal findings: soft, non-tender Extremities: LUE edema Wound: clean and dry  Lab Results: Recent Labs    09/19/17 0558 09/20/17 0400  WBC 10.3 15.0*  HGB 11.1* 10.2*  HCT 31.8* 29.8*  PLT 205 256   BMET:  Recent Labs    09/19/17 0558 09/20/17 0400  NA 134* 134*  K 4.8 4.0  CL 95* 93*  CO2 22 23  GLUCOSE 168* 165*  BUN 30* 20  CREATININE 6.56* 5.19*  CALCIUM 7.6* 7.7*    PT/INR: No results for input(s): LABPROT, INR in the last 72 hours. ABG    Component Value Date/Time   PHART 7.427 09/15/2017 0320   HCO3 24.3 09/15/2017 0320   TCO2 26 09/14/2017 1645   ACIDBASEDEF 4.0 (H) 09/14/2017 0153   O2SAT 41.5 09/19/2017 0642   CBG (last 3)  Recent Labs    09/19/17 1227  09/19/17 1950 09/19/17 2340  GLUCAP 93 121* 157*    Assessment/Plan: S/P Procedure(s) (LRB): CORONARY ARTERY BYPASS GRAFTING (CABG) times four LIMA  to LAD, SVG sequentially to OM and RAMUS Intermediate and SVG to Acute Marginal (N/A) TRANSESOPHAGEAL ECHOCARDIOGRAM (TEE) (N/A) MITRAL VALVE REPAIR (MVR) (N/A) -NEURO- mental status remains a problem. He seems a little more alert and is speaking a little better than yesterday. On ASA, plavix and heparin norepi to keep BP above 140  -CV- s/p CABG, mitral repair  Atrial flutter- paced, on amiodarone and heparin  Check coox  RESP- CXR shows some atelectasis  RENAL- ESRD- no HD planned for today  ENDO- CBG well controlled  Nutrition- will have cortrack placed today and begin TF     LOS: 12 days    Melrose Nakayama 09/20/2017

## 2017-09-20 NOTE — Progress Notes (Signed)
Patients wife advised to avoid walking over HD feed and drain lines.  Advice ignored.  Will continue to monitor.

## 2017-09-20 NOTE — Progress Notes (Signed)
Patient ID: Ernest [name], male   DOB: 02-20-1946, 71 y.o.   MRN: [id number] TCTS Evening Rounds:  Hemodynamically stable on levophed 10 mcg.  AV paced 85  Had HD today  Coretrack in and tube feeds going.

## 2017-09-20 NOTE — Progress Notes (Signed)
Physical Therapy Treatment Patient Details Name: Ernest [name] MRN: [id number] DOB: [birth year] Today's Date: 09/20/2017    History of Present Illness Pt adm 11/24 with NSTEMI and underwent CABG x 4 and MVR on 08/18/17. Post op lethargy and confusion. PMH - Multiple CVA's, DM, HTN, chf, ESRD on HD, cad, copd    PT Comments    Pt performed increased activity and progressing well from previous session. BP pre session 139/53 and post 140/53. Pt alert and oriented during session per nursing.  Pt remains to require significant assistance at this time and will require rehab in a post acute setting. Will inform supervising PT of change in recommendations at this time.  Plan next session for progression to gait if patient is able.      Follow Up Recommendations  Supervision/Assistance - 24 hour;SNF     Equipment Recommendations  (TBA at next venue)    Recommendations for Other Services       Precautions / Restrictions Precautions Precautions: Sternal;Fall Restrictions Weight Bearing Restrictions: Yes(sternal precautions) Other Position/Activity Restrictions: Sternal precautions    Mobility  Bed Mobility Overal bed mobility: Needs Assistance Bed Mobility: Supine to Sit     Supine to sit: Mod assist;+2 for physical assistance     General bed mobility comments: Pt required assistance to advance B LEs to edge of bed and to elevate trunk into sitting edge of bed.  Assist to avoid pushing with B UEs to maintain sternal precautions.    Transfers Overall transfer level: Needs assistance Equipment used: None Transfers: Sit to/from [facility] Sit to Stand: Mod assist;+2 physical assistance Stand pivot transfers: Mod assist;+2 physical assistance       General transfer comment: Pt required assist to lift into standing.  Pt able to stand upright with slight forward posture of head and upper trunk.  Pt required cues for turning toward chair and to control descent into  chair.  Pt appropriate during transfer and wrist restraints left off in chair.    Ambulation/Gait Ambulation/Gait assistance: (Pt performed shuffling steps from bed to chair with mod assistance +2 and B HHA.  )               Stairs            Wheelchair Mobility    Modified Rankin (Stroke Patients Only)       Balance Overall balance assessment: Needs assistance Sitting-balance support: No upper extremity supported;Feet supported Sitting balance-Leahy Scale: Poor Sitting balance - Comments: Required min assistance to maintain balance, patient contiually reaching for support with hands and required assistance to discourage pushing through B UEs.       Standing balance-Leahy Scale: Poor Standing balance comment: +2 external assistance to maintain standing.                              Cognition Arousal/Alertness: Lethargic;Suspect due to medications Behavior During Therapy: Flat affect Overall Cognitive Status: Impaired/Different from baseline Area of Impairment: Attention;Following commands                   Current Attention Level: Focused   Following Commands: Follows one step commands with increased time              Exercises Total Joint Exercises Long Arc Quad: AROM;Both;10 reps;Seated(cues for technique.  ) Marching in Standing: AROM;Both;10 reps;Seated    General Comments        Pertinent Vitals/Pain Pain Assessment:  Faces Faces Pain Scale: No hurt    Home Living                      Prior Function            PT Goals (current goals can now be found in the care plan section) Acute Rehab PT Goals Patient Stated Goal: To get in the chair Potential to Achieve Goals: Fair Progress towards PT goals: Progressing toward goals    Frequency    Min 3X/week      PT Plan Current plan remains appropriate    Co-evaluation              AM-PAC PT "6 Clicks" Daily Activity  Outcome Measure  Difficulty  turning over in bed (including adjusting bedclothes, sheets and blankets)?: Unable Difficulty moving from lying on back to sitting on the side of the bed? : Unable Difficulty sitting down on and standing up from a chair with arms (e.g., wheelchair, bedside commode, etc,.)?: Unable Help needed moving to and from a bed to chair (including a wheelchair)?: A Lot Help needed walking in hospital room?: A Lot Help needed climbing 3-5 steps with a railing? : Total 6 Click Score: 8    End of Session Equipment Utilized During Treatment: Gait belt Activity Tolerance: Patient limited by lethargy Patient left: in chair;with call bell/phone within reach;with chair alarm set(restraints not applied and nursing informed and agreeable to leave restraints off in chair. )   PT Visit Diagnosis: Other abnormalities of gait and mobility (R26.89);Muscle weakness (generalized) (M62.81);Difficulty in walking, not elsewhere classified (R26.2)     Time: 4680-3212 PT Time Calculation (min) (ACUTE ONLY): 20 min  Charges:  $Therapeutic Activity: 8-22 mins                    G CodesGovernor Rooks, PTA pager [phone]    Cristela Blue 09/20/2017, 10:16 AM

## 2017-09-20 NOTE — Progress Notes (Signed)
[facility] Progress Note   Assessment/ Plan:    Dialysis: [facility] MWF 4h 39mn  95.5kg   2/2 bath  LUA AVG  Hep none -venofer 50 q wk -calc 0.75 w HD   1. Severe CAD - sp CABG 12/1.  Milrinone d/c'd and levophed weaned initially but now back on 2. AMS - multifact.  CT head no new abnormality, neuro c/s, rec MRI/ MRA but couldn't get due to pacer wires, CTA without acute posterior circulation flow change 12/6.  He remains encephalopathic.  Since we've been doing serial HD think contribution from underdialysis is less likely. 3. ESRD HD MWF.  Serial HD due to vol overload, improving, initially was going to hold off today and resume tomorrow but will do today re: staffing issues 4. CM EF 45-50% 5. Vol overload - still vol up, serial HD to improve vol status 6. Hypotension - back on Levophed, helped by increased pacer setting 7. Anemia of CKD - Hb steady mid 10s-11. 8. DM w mult complications 9. Hx CVA (x 5 approx)- CT head negative 12/4, CTA as above   Subjective:    MS still not great.  Can answer where he is ([place]) if he is in pain (no) This AM questionable blink to threat.  Appreciate all neuro's recs.   Objective:   BP (!) 120/50 (BP Location: Right Arm)   Pulse 80   Temp 97.7 F (36.5 C) (Oral)   Resp 13   Ht '5\' 9"'  (1.753 m)   Wt 100.1 kg (220 lb 10.9 oz)   SpO2 100%   BMI 32.59 kg/m   Physical Exam: GEN: Confused, lying in bed.  A little more attentive today but still not at baseline PULM: bibasilar crackles improved CV: RRR no mrg, sternal scar intact ABD obese soft ntnd EXT: LE edema improved, still upper extremity edema L > R LUA AVG +bruit NEURO: follows commands, a little more responsive, I question whether or not he can see    Labs: BMET Recent Labs  Lab 09/14/17 0347  09/14/17 1645 09/15/17 0330 09/16/17 0354 09/17/17 0441 09/18/17 0434 09/19/17 0558 09/19/17 1415 09/19/17 1945 09/20/17 0400  NA 136  --  137 133* 132* 131*  135 134*  --   --  134*  K 4.3  --  3.5 4.1 3.9 4.3 3.7 4.8  --   --  4.0  CL 103  --  97* 96* 95* 91* 93* 95*  --   --  93*  CO2 23  --   --  '24 24 24 28 22  ' --   --  23  GLUCOSE 107*  --  125* 223* 214* 247* 215* 168*  --   --  165*  BUN 29*  --  16 22* 31* 23* 19 30*  --   --  20  CREATININE 6.53*   < > 3.90* 5.67* 7.24* 6.10* 4.75* 6.56*  --   --  5.19*  CALCIUM 7.2*  --   --  7.4* 7.5* 7.4* 7.3* 7.6*  --   --  7.7*  PHOS  --   --   --   --   --   --   --   --  3.0 3.6 4.1   < > = values in this interval not displayed.   CBC Recent Labs  Lab 09/17/17 0441 09/18/17 0434 09/19/17 0558 09/20/17 0400  WBC 14.0* 12.2* 10.3 15.0*  HGB 10.0* 10.3* 11.1* 10.2*  HCT 28.9* 29.8*  31.8* 29.8*  MCV 81.9 82.3 83.7 83.2  PLT 218 244 205 256    '@IMGRELPRIORS' @ Medications:    . amitriptyline  25 mg Oral QHS  . aspirin  81 mg Oral Daily  . atorvastatin  80 mg Oral q1800  . bisacodyl  10 mg Oral Daily   Or  . bisacodyl  10 mg Rectal Daily  . calcitRIOL  0.75 mcg Oral Q M,W,F-HD  . chlorhexidine gluconate (MEDLINE KIT)  15 mL Mouth Rinse BID  . cinacalcet  120 mg Oral Q supper  . clopidogrel  75 mg Oral Daily  . docusate sodium  200 mg Oral Daily  . feeding supplement (PRO-STAT SUGAR FREE 64)  30 mL Per Tube BID  . Influenza vac split quadrivalent PF  0.5 mL Intramuscular Tomorrow-1000  . insulin aspart  0-15 Units Subcutaneous TID WC  . insulin aspart  0-5 Units Subcutaneous QHS  . levETIRAcetam  1,000 mg Oral Daily  . levETIRAcetam  500 mg Oral Q M,W,F-HD  . mouth rinse  15 mL Mouth Rinse QID  . metoprolol tartrate  12.5 mg Oral BID   Or  . metoprolol tartrate  12.5 mg Per Tube BID  . multivitamin  1 tablet Oral QHS  . pantoprazole  40 mg Oral Daily  . risperiDONE  0.5 mg Oral QHS  . sevelamer carbonate  2,400 mg Oral TID WC  . sodium chloride flush  3 mL Intravenous Q12H     Madelon Lips MD [facility] pgr [phone] 09/20/2017, 8:21 AM

## 2017-09-21 DIAGNOSIS — I69398 Other sequelae of cerebral infarction: Secondary | ICD-10-CM

## 2017-09-21 DIAGNOSIS — R569 Unspecified convulsions: Secondary | ICD-10-CM

## 2017-09-21 LAB — CBC
HCT: 28.8 % — ABNORMAL LOW (ref 39.0–52.0)
HEMOGLOBIN: 10 g/dL — AB (ref 13.0–17.0)
MCH: 28.9 pg (ref 26.0–34.0)
MCHC: 34.7 g/dL (ref 30.0–36.0)
MCV: 83.2 fL (ref 78.0–100.0)
Platelets: 243 10*3/uL (ref 150–400)
RBC: 3.46 MIL/uL — AB (ref 4.22–5.81)
RDW: 15.5 % (ref 11.5–15.5)
WBC: 16.9 10*3/uL — ABNORMAL HIGH (ref 4.0–10.5)

## 2017-09-21 LAB — COMPREHENSIVE METABOLIC PANEL
ALK PHOS: 77 U/L (ref 38–126)
ALT: 20 U/L (ref 17–63)
AST: 25 U/L (ref 15–41)
Albumin: 2.2 g/dL — ABNORMAL LOW (ref 3.5–5.0)
Anion gap: 18 — ABNORMAL HIGH (ref 5–15)
BUN: 22 mg/dL — ABNORMAL HIGH (ref 6–20)
CALCIUM: 7.6 mg/dL — AB (ref 8.9–10.3)
CO2: 22 mmol/L (ref 22–32)
CREATININE: 4.59 mg/dL — AB (ref 0.61–1.24)
Chloride: 92 mmol/L — ABNORMAL LOW (ref 101–111)
GFR, EST AFRICAN AMERICAN: 14 mL/min — AB (ref >60)
GFR, EST NON AFRICAN AMERICAN: 12 mL/min — AB (ref >60)
Glucose, Bld: 272 mg/dL — ABNORMAL HIGH (ref 65–99)
Potassium: 3.8 mmol/L (ref 3.5–5.1)
Sodium: 132 mmol/L — ABNORMAL LOW (ref 135–145)
TOTAL PROTEIN: 5.2 g/dL — AB (ref 6.5–8.1)
Total Bilirubin: 0.9 mg/dL (ref 0.3–1.2)

## 2017-09-21 LAB — GLUCOSE, CAPILLARY
GLUCOSE-CAPILLARY: 327 mg/dL — AB (ref 65–99)
Glucose-Capillary: 315 mg/dL — ABNORMAL HIGH (ref 65–99)
Glucose-Capillary: 100 mg/dL — ABNORMAL HIGH (ref 65–99)
Glucose-Capillary: 171 mg/dL — ABNORMAL HIGH (ref 65–99)
Glucose-Capillary: 193 mg/dL — ABNORMAL HIGH (ref 65–99)

## 2017-09-21 LAB — HEPARIN LEVEL (UNFRACTIONATED): Heparin Unfractionated: 0.45 IU/mL (ref 0.30–0.70)

## 2017-09-21 MED ORDER — INSULIN ASPART 100 UNIT/ML ~~LOC~~ SOLN
0.0000 [IU] | SUBCUTANEOUS | Status: DC
  Administered 2017-09-21: 4 [IU] via SUBCUTANEOUS
  Administered 2017-09-22 (×2): 7 [IU] via SUBCUTANEOUS
  Administered 2017-09-22: 4 [IU] via SUBCUTANEOUS
  Administered 2017-09-22: 3 [IU] via SUBCUTANEOUS
  Administered 2017-09-22 (×2): 7 [IU] via SUBCUTANEOUS
  Administered 2017-09-23: 4 [IU] via SUBCUTANEOUS
  Administered 2017-09-23: 11 [IU] via SUBCUTANEOUS
  Administered 2017-09-23 – 2017-09-24 (×3): 4 [IU] via SUBCUTANEOUS
  Administered 2017-09-24: 3 [IU] via SUBCUTANEOUS
  Administered 2017-09-24: 4 [IU] via SUBCUTANEOUS
  Administered 2017-09-24: 7 [IU] via SUBCUTANEOUS
  Administered 2017-09-24 (×2): 4 [IU] via SUBCUTANEOUS
  Administered 2017-09-24: 7 [IU] via SUBCUTANEOUS
  Administered 2017-09-25 (×5): 4 [IU] via SUBCUTANEOUS
  Administered 2017-09-26 (×3): 7 [IU] via SUBCUTANEOUS
  Administered 2017-09-26: 4 [IU] via SUBCUTANEOUS
  Administered 2017-09-26: 7 [IU] via SUBCUTANEOUS
  Administered 2017-09-27: 3 [IU] via SUBCUTANEOUS
  Administered 2017-09-27 (×2): 4 [IU] via SUBCUTANEOUS

## 2017-09-21 MED ORDER — INSULIN DETEMIR 100 UNIT/ML ~~LOC~~ SOLN
15.0000 [IU] | Freq: Two times a day (BID) | SUBCUTANEOUS | Status: DC
  Administered 2017-09-21: 15 [IU] via SUBCUTANEOUS
  Filled 2017-09-21 (×2): qty 0.15

## 2017-09-21 MED ORDER — LEVETIRACETAM 100 MG/ML PO SOLN
1000.0000 mg | Freq: Every day | ORAL | Status: DC
  Administered 2017-09-21 – 2017-10-03 (×13): 1000 mg
  Filled 2017-09-21 (×15): qty 10

## 2017-09-21 MED ORDER — SODIUM CHLORIDE 0.9% FLUSH
10.0000 mL | Freq: Two times a day (BID) | INTRAVENOUS | Status: DC
  Administered 2017-09-21 – 2017-09-23 (×3): 10 mL

## 2017-09-21 MED ORDER — CHLORHEXIDINE GLUCONATE CLOTH 2 % EX PADS
6.0000 | MEDICATED_PAD | Freq: Every day | CUTANEOUS | Status: DC
  Administered 2017-09-21 – 2017-09-25 (×5): 6 via TOPICAL

## 2017-09-21 MED ORDER — AMIODARONE HCL 200 MG PO TABS
200.0000 mg | ORAL_TABLET | Freq: Two times a day (BID) | ORAL | Status: DC
  Administered 2017-09-21 – 2017-10-03 (×23): 200 mg via NASOGASTRIC
  Filled 2017-09-21 (×24): qty 1

## 2017-09-21 MED ORDER — SODIUM CHLORIDE 0.9% FLUSH
10.0000 mL | INTRAVENOUS | Status: DC | PRN
  Administered 2017-09-23: 10 mL
  Filled 2017-09-21: qty 40

## 2017-09-21 MED ORDER — LEVETIRACETAM 100 MG/ML PO SOLN
500.0000 mg | ORAL | Status: DC
  Administered 2017-09-23 – 2017-10-02 (×4): 500 mg
  Filled 2017-09-21 (×6): qty 5

## 2017-09-21 NOTE — Progress Notes (Signed)
STROKE TEAM PROGRESS NOTE   SUBJECTIVE (INTERVAL HISTORY) No family at bedside.   OBJECTIVE Temp:  [97.5 F (36.4 C)-98.9 F (37.2 C)] 98.9 F (37.2 C) (12/08 1146) Pulse Rate:  [80-88] 80 (12/08 1300) Cardiac Rhythm: Atrial paced;Ventricular paced (12/08 1200) Resp:  [11-32] 15 (12/08 1100) BP: (95-170)/(44-120) 95/51 (12/08 1300) SpO2:  [92 %-100 %] 98 % (12/08 1300) Weight:  [215 lb 2.7 oz (97.6 kg)-224 lb 13.9 oz (102 kg)] 224 lb 13.9 oz (102 kg) (12/08 0600)  Recent Labs  Lab 09/20/17 1112 09/20/17 1621 09/20/17 1949 09/21/17 0817 09/21/17 1118  GLUCAP 178* 85 127* 327* 315*   Recent Labs  Lab 09/14/17 1638  09/17/17 0441 09/18/17 0434 09/19/17 0558 09/19/17 1415 09/19/17 1945 09/20/17 0400 09/20/17 1628 09/21/17 0320  NA  --    < > 131* 135 134*  --   --  134*  --  132*  K  --    < > 4.3 3.7 4.8  --   --  4.0  --  3.8  CL  --    < > 91* 93* 95*  --   --  93*  --  92*  CO2  --    < > 24 28 22   --   --  23  --  22  GLUCOSE  --    < > 247* 215* 168*  --   --  165*  --  272*  BUN  --    < > 23* 19 30*  --   --  20  --  22*  CREATININE 4.21*   < > 6.10* 4.75* 6.56*  --   --  5.19*  --  4.59*  CALCIUM  --    < > 7.4* 7.3* 7.6*  --   --  7.7*  --  7.6*  MG 1.9  --   --   --   --  1.9 2.0 2.0 2.0  --   PHOS  --   --   --   --   --  3.0 3.6 4.1 2.6  --    < > = values in this interval not displayed.   Recent Labs  Lab 09/15/17 0330 09/21/17 0320  AST 44* 25  ALT 23 20  ALKPHOS 44 77  BILITOT 0.5 0.9  PROT 5.1* 5.2*  ALBUMIN 2.5* 2.2*   Recent Labs  Lab 09/17/17 0441 09/18/17 0434 09/19/17 0558 09/20/17 0400 09/21/17 0320  WBC 14.0* 12.2* 10.3 15.0* 16.9*  HGB 10.0* 10.3* 11.1* 10.2* 10.0*  HCT 28.9* 29.8* 31.8* 29.8* 28.8*  MCV 81.9 82.3 83.7 83.2 83.2  PLT 218 244 205 256 243   No results for input(s): CKTOTAL, CKMB, CKMBINDEX, TROPONINI in the last 168 hours. Recent Labs    09/20/17 1106 09/20/17 1107  LABPROT 17.2* 17.0*  INR 1.41 1.40    No results for input(s): COLORURINE, LABSPEC, PHURINE, GLUCOSEU, HGBUR, BILIRUBINUR, KETONESUR, PROTEINUR, UROBILINOGEN, NITRITE, LEUKOCYTESUR in the last 72 hours.  Invalid input(s): APPERANCEUR     Component Value Date/Time   CHOL 116 10/28/2016 0442   TRIG 92 10/28/2016 0442   HDL 48 10/28/2016 0442   CHOLHDL 2.4 10/28/2016 0442   VLDL 18 10/28/2016 0442   LDLCALC 50 10/28/2016 0442   Lab Results  Component Value Date   HGBA1C 9.1 (H) 09/08/2017   No results found for: LABOPIA, COCAINSCRNUR, LABBENZ, AMPHETMU, THCU, LABBARB  No results for input(s): ETH in the last 168 hours.   IMAGING  I have personally reviewed the radiological images below and agree with the radiology interpretations.  Ct Angio Head and neck W Or Wo Contrast 09/19/2017 IMPRESSION: Stable appearing intracranial and extracranial atherosclerotic disease. No flow-limiting stenosis or occlusion involving the carotid, vertebral, or basilar arteries. No abnormality is seen to suggest acute posterior circulation ischemic change.    Ct Head Wo Contrast 09/17/2017 IMPRESSION:  1. No acute intracranial process.  2. Stable mild parenchymal brain volume loss.  3. Stable mild-to-moderate chronic small vessel ischemic disease and old small cerebellar infarcts.    TEE 09/13/17  Septum: No Patent Foramen Ovale present.  Left atrium: Patent foramen ovale not present.  Left atrium: Cavity is mildly to moderately dilated. Systolic blunting of pulmonary venous inflow. Spontaneous echo contrast.  Aortic valve: The valve is trileaflet. Mild valve calcification present. Mildly decreased leaflet separation. No stenosis. No regurgitation.  Mitral valve: Dilated mitral annulus. No leaflet thickening and calcification present. Moderate to severe regurgitation.  Right ventricle: Normal wall thickness. Cavity is mildly to moderately dilated. Moderately reduced systolic function. RV systolic function is decreased moderately  Abnormal tricuspid annular plane systolic excursion (TAPSE) < 1.7cm. No thrombus present. No mass present. Catheter present in the ventricle.  Right atrium: Cavity is mildly dilated. Interatrial septum bows to the left, consistent with elevated right atrial pressure.  Tricuspid valve: Valve has a dilated annulus. Mild to moderate regurgitation.    TTE 09/08/17 - Left ventricle: Inferobasal and posterior lateral hypokinesis EF   similar to echo from 2016. The cavity size was normal. Wall   thickness was normal. Systolic function was mildly reduced. The   estimated ejection fraction was in the range of 45% to 50%. - Mitral valve: Calcified annulus. Mildly thickened leaflets .   There was mild regurgitation. - Left atrium: The atrium was moderately dilated. - Pulmonary arteries: PA peak pressure: 50 mm Hg (S).   PHYSICAL EXAM  Vitals:   09/21/17 1100 09/21/17 1146 09/21/17 1200 09/21/17 1300  BP: (!) 150/100  (!) 101/50 (!) 95/51  Pulse: 80  80 80  Resp: 15     Temp:  98.9 F (37.2 C)    TempSrc:  Oral    SpO2: 96%  98% 98%  Weight:      Height:        General - obese, well developed, very lethargic  Ophthalmologic - fundi not visualized due to noncooperation.  Cardiovascular - Regular rate and rhythm, no afib.  Neuro - awake, eyes open, but very lethargic. Able to follow limited simple commands, but not able to follow all commands. Severely dysarthric and words intelligible. Able to tell me his name and age and place, but not to time or people. PERRL, blinking to visual threat bilaterally, however, incomplete abduction and adduction bilaterally. No significant facial droop, tongue midline in mouth. BUE 3+/5 bilaterally with asterixis, BLE 3-/5 proximal symmetrical, distal 5/5. DTR 1+ and no babinski. Sensation, coordination not cooperative and gait not tested   ASSESSMENT/PLAN Mr. MILEN LENGACHER is a 71 y.o. male with history of CHF, CAD, ESRD on HD, HTN, HLD, obesity, DM,  partial seizure on Keppra, penile implant admitted for CABG procedure.  Became lethargic, confusion, altered mental status 2 days after CABG procedure.    Encephalopathy - likely due to pulmonary edema, leukocytosis and deconditioning. Stroke still in DDx given aflutter and multiple vascular risk factors but no focal deficit and CT neg  Resultant lethargy, severe dysarthria, disorientation, asterixis  MRI not able to perform  due to penile implant  CT head no acute abnormalities  CTA head and neck - b/l siphon, right VA origin severe athero/stenosis, right ICA proximal athero  2D Echo  EF 45-50%  LDL 10/2016 = 50  HgbA1c 08/2017 = 9.1  Heparin IV for VTE prophylaxis  Diet NPO time specified   aspirin 81 mg daily and clopidogrel 75 mg daily prior to admission, now on aspirin 81 mg daily, clopidogrel 75 mg daily and heparin IV. Anticoagulation needed due to aflutter.  Pharmacy consulted for IV Heparin in addition to ASA / Plavix  Patient counseled to be compliant with his antithrombotic medications  Ongoing aggressive stroke risk factor management  Therapy recommendations:  pending  Disposition:  Pending  Aflutter  Post CABG procedure  Agree with anticoagulation with heparin IV  CVTS on board   CAD s/p CABG  09/14/17 CABG  CVTS on board  Hx of stroke  06/2015 diplopia carotid Doppler negative, EF 45-50%.  CT negative. LDL 36 and A1c 7.7. put on aspirin and Plavix.  10/2016, left leg weakness CT negative, carotid Doppler negative, LDL 50 and A1c 8.7, concerning for hypotension related  Diabetes  HgbA1c 9.1 in 08/2017 goal < 7.0  Uncontrolled  CBG monitoring  SSI  DM education and close PCP follow up  Hypertension Stable  Long term BP goal Normotensive  Hyperlipidemia  Home meds: Lipitor 40  LDL 58 in 10/2016, goal < 70  Now on Lipitor 80  Continue statin at discharge  Other Stroke Risk Factors  Advanced age  Obesity, Body mass index is 33.21  kg/m.   Other Active Problems  ESRD on HD  Partial seizure on Keppra  Penile implant - precludes MRI  Follows with Dr. Tomi Likens at the [facility] # [facility]   Stroke will sign off, please reconsult if needed  Personally  participated in, made any corrections needed, and agree with history, physical, neuro exam,assessment and plan as stated above.     Sarina Ill, MD Stroke Neurology         To contact Stroke Continuity provider, please refer to [email or web address] After hours, contact General Neurology

## 2017-09-21 NOTE — Progress Notes (Signed)
8 Days Post-Op Procedure(s) (LRB): CORONARY ARTERY BYPASS GRAFTING (CABG) times four LIMA  to LAD, SVG sequentially to OM and RAMUS Intermediate and SVG to Acute Marginal (N/A) TRANSESOPHAGEAL ECHOCARDIOGRAM (TEE) (N/A) MITRAL VALVE REPAIR (MVR) (N/A) Subjective:  Non-verbal for me   Objective: Vital signs in last 24 hours: Temp:  [97.9 F (36.6 C)-98.9 F (37.2 C)] 97.9 F (36.6 C) (12/08 1623) Pulse Rate:  [79-87] 79 (12/08 1800) Cardiac Rhythm: Atrial paced;Ventricular paced (12/08 1600) Resp:  [11-32] 17 (12/08 1800) BP: (95-170)/(35-120) 101/35 (12/08 1800) SpO2:  [95 %-100 %] 98 % (12/08 1800) Weight:  [102 kg (224 lb 13.9 oz)] 102 kg (224 lb 13.9 oz) (12/08 0600)  Hemodynamic parameters for last 24 hours:    Intake/Output from previous day: 12/07 0701 - 12/08 0700 In: 1538.5 [I.V.:856; NG/GT:682.5] Out: 2500  Intake/Output this shift: Total I/O In: 960.6 [P.O.:1; I.V.:409.6; NG/GT:550] Out: -   General appearance: moving some but will not speak to me.  Neurologic: remains encephalopathic Heart: regular rate and rhythm, S1, S2 normal, no murmur, click, rub or gallop Lungs: clear to auscultation bilaterally Abdomen: soft, non-tender; bowel sounds normal; no masses,  no organomegaly Extremities: edema mild Wound: chest incision ok  Lab Results: Recent Labs    09/20/17 0400 09/21/17 0320  WBC 15.0* 16.9*  HGB 10.2* 10.0*  HCT 29.8* 28.8*  PLT 256 243   BMET:  Recent Labs    09/20/17 0400 09/21/17 0320  NA 134* 132*  K 4.0 3.8  CL 93* 92*  CO2 23 22  GLUCOSE 165* 272*  BUN 20 22*  CREATININE 5.19* 4.59*  CALCIUM 7.7* 7.6*    PT/INR:  Recent Labs    09/20/17 1107  LABPROT 17.0*  INR 1.40   ABG    Component Value Date/Time   PHART 7.427 09/15/2017 0320   HCO3 24.3 09/15/2017 0320   TCO2 26 09/14/2017 1645   ACIDBASEDEF 4.0 (H) 09/14/2017 0153   O2SAT 75.0 09/20/2017 0805   CBG (last 3)  Recent Labs    09/21/17 0817 09/21/17 1118  09/21/17 1550  GLUCAP 327* 315* 100*    Assessment/Plan: S/P Procedure(s) (LRB): CORONARY ARTERY BYPASS GRAFTING (CABG) times four LIMA  to LAD, SVG sequentially to OM and RAMUS Intermediate and SVG to Acute Marginal (N/A) TRANSESOPHAGEAL ECHOCARDIOGRAM (TEE) (N/A) MITRAL VALVE REPAIR (MVR) (N/A)  Hemodynamically labile on levophed.  Sinus rhythm 78 under pacer. Will put on VVI 50 backup. Switch IV amio to per tube. ESRD: per nephrology DM: hyperglycemia, will start Levemir BID and change SSI to Q 4.  GI: tolerating TF. No BM yet.   LOS: 13 days    Gaye Pollack 09/21/2017

## 2017-09-21 NOTE — Progress Notes (Signed)
ANTICOAGULATION CONSULT NOTE  Pharmacy Consult for heparin  Indication: atrial fibrillation  [name]  Patient Measurements: Height: '5\' 9"'  (175.3 cm) Weight: 224 lb 13.9 oz (102 kg) IBW/kg (Calculated) : 70.7 Heparin Dosing Weight: 92kg  Vital Signs: Temp: 98.9 F (37.2 C) (12/08 1146) Temp Source: Oral (12/08 1146) BP: 101/50 (12/08 1200) Pulse Rate: 80 (12/08 1200)  Labs: Recent Labs    09/19/17 0558  09/19/17 2151 09/20/17 0400 09/20/17 1106 09/20/17 1107 09/21/17 0320  HGB 11.1*  --   --  10.2*  --   --  10.0*  HCT 31.8*  --   --  29.8*  --   --  28.8*  PLT 205  --   --  256  --   --  243  LABPROT  --   --   --   --  17.2* 17.0*  --   INR  --   --   --   --  1.41 1.40  --   HEPARINUNFRC  --    < > 0.32  --   --  0.62 0.45  CREATININE 6.56*  --   --  5.19*  --   --  4.59*   < > = values in this interval not displayed.    Estimated Creatinine Clearance: 17.9 mL/min (A) (by C-G formula based on SCr of 4.59 mg/dL (H)).   Medical History: Past Medical History:  Diagnosis Date  . Abnormal stress test    s/p cath November 2013 with modest disease involving the ostial left main, proximal LAD, proximal RCA - do not appear to be hemodynamically signficant; mild LV dysfunction  . Bacteremia   . Chronic systolic CHF (congestive heart failure) ([place])   . COPD (chronic obstructive pulmonary disease) ([place])   . Coronary artery disease    per cath report 2013  . Ejection fraction < 50%   . Erectile dysfunction    penile implant  . ESRD (end stage renal disease) ([place])    Started HD in [place] in 2009, ESRD was due to DM. Moved to [facility] in Dec 2009 and now gets dialysis at [facility] on a MWF schedule.     . Gout   . Gout    Hx: of  . Hepatitis C   . Hyperlipidemia   . Hypertension   . Obesity   . Retinopathy   . Shortness of breath    Hx: of with exertion  . Stroke ([place])   . Type II or unspecified type diabetes mellitus without mention of complication, not  stated as uncontrolled    adult onset    Medications:  Scheduled:  . amitriptyline  25 mg Oral QHS  . aspirin  81 mg Oral Daily  . atorvastatin  80 mg Oral q1800  . bisacodyl  10 mg Oral Daily   Or  . bisacodyl  10 mg Rectal Daily  . chlorhexidine gluconate (MEDLINE KIT)  15 mL Mouth Rinse BID  . Chlorhexidine Gluconate Cloth  6 each Topical Daily  . cinacalcet  120 mg Oral Q supper  . clopidogrel  75 mg Oral Daily  . docusate sodium  200 mg Oral Daily  . doxercalciferol  2 mcg Intravenous Q M,W,F-HD  . feeding supplement (PRO-STAT SUGAR FREE 64)  30 mL Per Tube BID  . Influenza vac split quadrivalent PF  0.5 mL Intramuscular Tomorrow-1000  . insulin aspart  0-15 Units Subcutaneous TID WC  . insulin aspart  0-5 Units Subcutaneous QHS  .  levETIRAcetam  1,000 mg Per Tube Daily  . [START ON 09/23/2017] levETIRAcetam  500 mg Per Tube Q M,W,F-HD  . mouth rinse  15 mL Mouth Rinse QID  . metoprolol tartrate  12.5 mg Oral BID   Or  . metoprolol tartrate  12.5 mg Per Tube BID  . multivitamin  1 tablet Oral QHS  . pantoprazole  40 mg Oral Daily  . risperiDONE  0.5 mg Oral QHS  . sevelamer carbonate  2,400 mg Oral TID WC  . sodium chloride flush  10-40 mL Intracatheter Q12H  . sodium chloride flush  3 mL Intravenous Q12H    Assessment: [name] s/p CABG and MV repair on 11/30 and now noted with afib.  Pharmacy consulted to dose heparin (no bolus).  No overt bleeding or complications noted.  Hgb fairly stable. -heparin level at goal on 1300 units/hr  Goal of Therapy:  Heparin level 0.3-0.7 units/ml Monitor platelets by anticoagulation protocol: Yes   Plan:  -No heparin changes needed -Daily heparin level and CBC -Will follow oral anticoagulation plans  Ernest [name], BCPS  Clinical Pharmacist Pager [phone]  09/21/2017 12:37 PM

## 2017-09-21 NOTE — Progress Notes (Signed)
[facility] Progress Note  Subjective: remains confused   Vitals:   09/21/17 0600 09/21/17 0630 09/21/17 0700 09/21/17 0825  BP: (!) 151/48 (!) 140/52 (!) 153/60   Pulse: 81 80 82   Resp: 13 14 (!) 22   Temp:    98.7 F (37.1 C)  TempSrc:    Oral  SpO2: 96% 95% 98%   Weight: 102 kg (224 lb 13.9 oz)     Height:        Inpatient medications: . amitriptyline  25 mg Oral QHS  . aspirin  81 mg Oral Daily  . atorvastatin  80 mg Oral q1800  . bisacodyl  10 mg Oral Daily   Or  . bisacodyl  10 mg Rectal Daily  . chlorhexidine gluconate (MEDLINE KIT)  15 mL Mouth Rinse BID  . cinacalcet  120 mg Oral Q supper  . clopidogrel  75 mg Oral Daily  . docusate sodium  200 mg Oral Daily  . doxercalciferol  2 mcg Intravenous Q M,W,F-HD  . feeding supplement (PRO-STAT SUGAR FREE 64)  30 mL Per Tube BID  . Influenza vac split quadrivalent PF  0.5 mL Intramuscular Tomorrow-1000  . insulin aspart  0-15 Units Subcutaneous TID WC  . insulin aspart  0-5 Units Subcutaneous QHS  . levETIRAcetam  1,000 mg Per Tube Daily  . [START ON 09/23/2017] levETIRAcetam  500 mg Per Tube Q M,W,F-HD  . mouth rinse  15 mL Mouth Rinse QID  . metoprolol tartrate  12.5 mg Oral BID   Or  . metoprolol tartrate  12.5 mg Per Tube BID  . multivitamin  1 tablet Oral QHS  . pantoprazole  40 mg Oral Daily  . risperiDONE  0.5 mg Oral QHS  . sevelamer carbonate  2,400 mg Oral TID WC  . sodium chloride flush  3 mL Intravenous Q12H   . sodium chloride    . sodium chloride Stopped (09/19/17 1700)  . sodium chloride    . sodium chloride    . amiodarone 30 mg/hr (09/21/17 0700)  . feeding supplement (NEPRO CARB STEADY) 1,000 mL (09/21/17 0700)  . heparin 1,300 Units/hr (09/21/17 0941)  . lactated ringers 20 mL/hr at 09/13/17 1600  . magnesium sulfate    . norepinephrine (LEVOPHED) Adult infusion 6 mcg/min (09/21/17 1003)  . phenylephrine (NEO-SYNEPHRINE) Adult infusion     sodium chloride, sodium  chloride, alteplase, haloperidol lactate, heparin, lidocaine (PF), lidocaine-prilocaine, metoprolol tartrate, ondansetron (ZOFRAN) IV, pentafluoroprop-tetrafluoroeth, sodium chloride flush, traMADol  Exam: GEN: Confused, lying in bed.  A little more attentive today but still not at baseline PULM: clear bilat to bases CV: RRR no mrg, sternal scar intact ABD obese soft ntnd EXT: still upper extremity edema L > R LUA AVG +bruit NEURO: follows commands, a little more responsive    Dialysis: Dialysis:[facility] MWF 4h 9mn 95.5kg 2/2 bath LUA AVG Hep none -venofer 50 q wk -calc 0.75 w HD        Impression: 1. CAD sp CABG: on 12/1. Was off pressors, now back on.   2. AMS - multifactorial. CT head no acute changes.  Remains encephalopathic.  3. ESRD HD MWF 4. Vol overload: wt's still up, no severe vol ^ on exam 5. Hypotension: back on levophed gtt 6. Anemia of CKD: Hb 10 -11 range 7. DM , mult complications 8. Hx CVA's   Plan - next HD Monday on schedule   RKelly SplinterMD [facility] pager [phone]  09/21/2017, 11:08 AM  Recent Labs  Lab 09/19/17 0558  09/19/17 1945 09/20/17 0400 09/20/17 1628 09/21/17 0320  NA 134*  --   --  134*  --  132*  K 4.8  --   --  4.0  --  3.8  CL 95*  --   --  93*  --  92*  CO2 22  --   --  23  --  22  GLUCOSE 168*  --   --  165*  --  272*  BUN 30*  --   --  20  --  22*  CREATININE 6.56*  --   --  5.19*  --  4.59*  CALCIUM 7.6*  --   --  7.7*  --  7.6*  PHOS  --    < > 3.6 4.1 2.6  --    < > = values in this interval not displayed.   Recent Labs  Lab 09/15/17 0330 09/21/17 0320  AST 44* 25  ALT 23 20  ALKPHOS 44 77  BILITOT 0.5 0.9  PROT 5.1* 5.2*  ALBUMIN 2.5* 2.2*   Recent Labs  Lab 09/19/17 0558 09/20/17 0400 09/21/17 0320  WBC 10.3 15.0* 16.9*  HGB 11.1* 10.2* 10.0*  HCT 31.8* 29.8* 28.8*  MCV 83.7 83.2 83.2  PLT 205 256 243   Iron/TIBC/Ferritin/ %Sat No results found for: IRON, TIBC, FERRITIN,  IRONPCTSAT

## 2017-09-21 NOTE — Progress Notes (Signed)
Unable to draw cooexemetry from introducer

## 2017-09-22 LAB — GLUCOSE, CAPILLARY
GLUCOSE-CAPILLARY: 213 mg/dL — AB (ref 65–99)
GLUCOSE-CAPILLARY: 57 mg/dL — AB (ref 65–99)
GLUCOSE-CAPILLARY: 66 mg/dL (ref 65–99)
GLUCOSE-CAPILLARY: 110 mg/dL — AB (ref 65–99)
Glucose-Capillary: 150 mg/dL — ABNORMAL HIGH (ref 65–99)
Glucose-Capillary: 189 mg/dL — ABNORMAL HIGH (ref 65–99)
Glucose-Capillary: 210 mg/dL — ABNORMAL HIGH (ref 65–99)
Glucose-Capillary: 238 mg/dL — ABNORMAL HIGH (ref 65–99)

## 2017-09-22 LAB — HEPARIN LEVEL (UNFRACTIONATED): HEPARIN UNFRACTIONATED: 0.46 [IU]/mL (ref 0.30–0.70)

## 2017-09-22 LAB — CBC
HCT: 28.8 % — ABNORMAL LOW (ref 39.0–52.0)
Hemoglobin: 9.8 g/dL — ABNORMAL LOW (ref 13.0–17.0)
MCH: 28.2 pg (ref 26.0–34.0)
MCHC: 34 g/dL (ref 30.0–36.0)
MCV: 82.8 fL (ref 78.0–100.0)
Platelets: 329 10*3/uL (ref 150–400)
RBC: 3.48 MIL/uL — ABNORMAL LOW (ref 4.22–5.81)
RDW: 15.4 % (ref 11.5–15.5)
WBC: 20.3 10*3/uL — ABNORMAL HIGH (ref 4.0–10.5)

## 2017-09-22 MED ORDER — DEXTROSE 50 % IV SOLN
INTRAVENOUS | Status: AC
Start: 2017-09-22 — End: 2017-09-22
  Administered 2017-09-22: 25 mL via INTRAVENOUS
  Filled 2017-09-22: qty 50

## 2017-09-22 MED ORDER — INSULIN DETEMIR 100 UNIT/ML ~~LOC~~ SOLN
10.0000 [IU] | Freq: Two times a day (BID) | SUBCUTANEOUS | Status: DC
  Administered 2017-09-22 – 2017-09-23 (×3): 10 [IU] via SUBCUTANEOUS
  Filled 2017-09-22 (×4): qty 0.1

## 2017-09-22 MED ORDER — PANTOPRAZOLE SODIUM 40 MG PO PACK
40.0000 mg | PACK | Freq: Every day | ORAL | Status: DC
  Administered 2017-09-22 – 2017-10-03 (×12): 40 mg
  Filled 2017-09-22 (×12): qty 20

## 2017-09-22 MED ORDER — DEXTROSE 50 % IV SOLN
25.0000 mL | Freq: Once | INTRAVENOUS | Status: AC
  Administered 2017-09-22: 25 mL via INTRAVENOUS

## 2017-09-22 NOTE — Progress Notes (Signed)
9 Days Post-Op Procedure(s) (LRB): CORONARY ARTERY BYPASS GRAFTING (CABG) times four LIMA  to LAD, SVG sequentially to OM and RAMUS Intermediate and SVG to Acute Marginal (N/A) TRANSESOPHAGEAL ECHOCARDIOGRAM (TEE) (N/A) MITRAL VALVE REPAIR (MVR) (N/A) Subjective: Non-verbal  Objective: Vital signs in last 24 hours: Temp:  [97.9 F (36.6 C)-98.9 F (37.2 C)] 98.3 F (36.8 C) (12/09 1146) Pulse Rate:  [76-82] 81 (12/09 1130) Cardiac Rhythm: Normal sinus rhythm (12/09 0800) Resp:  [9-27] 16 (12/09 1130) BP: (92-184)/(20-67) 98/34 (12/09 1130) SpO2:  [94 %-100 %] 97 % (12/09 1130) Weight:  [100.5 kg (221 lb 9 oz)] 100.5 kg (221 lb 9 oz) (12/09 0443)  Hemodynamic parameters for last 24 hours:    Intake/Output from previous day: 12/08 0701 - 12/09 0700 In: 1771.4 [P.O.:31; I.V.:640.4; NG/GT:1100] Out: -  Intake/Output this shift: Total I/O In: 443 [I.V.:73; NG/GT:370] Out: -   General appearance: non-verbal and does not follow commands Neurologic: some spontaneous movement Heart: regular rate and rhythm, S1, S2 normal, no murmur, click, rub or gallop Lungs: clear to auscultation bilaterally Abdomen: soft, non-distended Extremities: extremities normal, atraumatic, no cyanosis or edema Wound: incisions ok  Lab Results: Recent Labs    09/21/17 0320 09/22/17 0346  WBC 16.9* 20.3*  HGB 10.0* 9.8*  HCT 28.8* 28.8*  PLT 243 329   BMET:  Recent Labs    09/20/17 0400 09/21/17 0320  NA 134* 132*  K 4.0 3.8  CL 93* 92*  CO2 23 22  GLUCOSE 165* 272*  BUN 20 22*  CREATININE 5.19* 4.59*  CALCIUM 7.7* 7.6*    PT/INR:  Recent Labs    09/20/17 1107  LABPROT 17.0*  INR 1.40   ABG    Component Value Date/Time   PHART 7.427 09/15/2017 0320   HCO3 24.3 09/15/2017 0320   TCO2 26 09/14/2017 1645   ACIDBASEDEF 4.0 (H) 09/14/2017 0153   O2SAT 75.0 09/20/2017 0805   CBG (last 3)  Recent Labs    09/22/17 0812 09/22/17 0847 09/22/17 1144  GLUCAP 66 110* 150*     Assessment/Plan: S/P Procedure(s) (LRB): CORONARY ARTERY BYPASS GRAFTING (CABG) times four LIMA  to LAD, SVG sequentially to OM and RAMUS Intermediate and SVG to Acute Marginal (N/A) TRANSESOPHAGEAL ECHOCARDIOGRAM (TEE) (N/A) MITRAL VALVE REPAIR (MVR) (N/A)  He has been hemodynamically stable on low dose levophed   Rhythm has been sinus. Still on heparin. Will need to transition to Coumadin at some point.  Tolerating tube feeds at goal  DM: Levemir 15 bid started last night and glucose dropped this am to 66 so will decrease Levemir to 10 bid.  Neuro status unchanged.   LOS: 14 days    Gaye Pollack 09/22/2017

## 2017-09-22 NOTE — Progress Notes (Signed)
ANTICOAGULATION CONSULT NOTE  Pharmacy Consult for heparin  Indication: atrial fibrillation  [name]  Patient Measurements: Height: 5\' 9"  (175.3 cm) Weight: 221 lb 9 oz (100.5 kg) IBW/kg (Calculated) : 70.7 Heparin Dosing Weight: 92kg  Vital Signs: Temp: 98.9 F (37.2 C) (12/09 0756) Temp Source: Axillary (12/09 0756) BP: 153/43 (12/09 0400) Pulse Rate: 81 (12/09 0600)  Labs: Recent Labs    09/20/17 0400 09/20/17 1106 09/20/17 1107 09/21/17 0320 09/22/17 0346  HGB 10.2*  --   --  10.0* 9.8*  HCT 29.8*  --   --  28.8* 28.8*  PLT 256  --   --  243 329  LABPROT  --  17.2* 17.0*  --   --   INR  --  1.41 1.40  --   --   HEPARINUNFRC  --   --  0.62 0.45 0.46  CREATININE 5.19*  --   --  4.59*  --     Estimated Creatinine Clearance: 17.7 mL/min (A) (by C-G formula based on SCr of 4.59 mg/dL (H)).   Medications:  Scheduled:  . sodium chloride    . sodium chloride Stopped (09/19/17 1700)  . sodium chloride    . sodium chloride    . feeding supplement (NEPRO CARB STEADY) 1,000 mL (09/22/17 0600)  . heparin 1,300 Units/hr (09/22/17 0600)  . lactated ringers 20 mL/hr at 09/13/17 1600  . magnesium sulfate    . norepinephrine (LEVOPHED) Adult infusion 2 mcg/min (09/22/17 0600)  . phenylephrine (NEO-SYNEPHRINE) Adult infusion       Assessment: 71 yo male s/p CABG and MV repair on 11/30 and now noted with afib.  Pharmacy consulted to dose heparin (no bolus).  No overt bleeding or complications noted.  Hgb fairly stable. -heparin level at goal on 1300 units/hr  Goal of Therapy:  Heparin level 0.3-0.7 units/ml Monitor platelets by anticoagulation protocol: Yes   Plan:  -No heparin changes needed -Daily heparin level and CBC -Start Coumadin soon?  [name], BCPS  Clinical Pharmacist Pager [phone]  09/22/2017 8:55 AM

## 2017-09-22 NOTE — Progress Notes (Signed)
Hypoglycemic Event  CBG: 56  09/22/17 0755  Treatment: 57mL Dextrose 50%   Symptoms:N/A  Follow-up CBG: YFRT:[id number] CBG Result:110  Possible Reasons for Event: Levemir started 12/8 2200.   Comments:Will continue to monitor patient closely.    Vonna Drafts RN

## 2017-09-22 NOTE — Progress Notes (Signed)
Erratic BP reading making accurate titration of Neosyn difficult. Pediatric BP cuff in use from previous shifts. Changed to Adult cuff with better results.

## 2017-09-22 NOTE — Progress Notes (Signed)
[facility] Progress Note  Subjective: remains confused   Vitals:   09/22/17 0443 09/22/17 0500 09/22/17 0600 09/22/17 0756  BP:      Pulse:  79 81   Resp:  12 17   Temp:    98.9 F (37.2 C)  TempSrc:    Axillary  SpO2:  97% 99%   Weight: 100.5 kg (221 lb 9 oz)     Height:        Inpatient medications: . amiodarone  200 mg Per NG tube BID  . amitriptyline  25 mg Oral QHS  . aspirin  81 mg Oral Daily  . atorvastatin  80 mg Oral q1800  . bisacodyl  10 mg Oral Daily   Or  . bisacodyl  10 mg Rectal Daily  . chlorhexidine gluconate (MEDLINE KIT)  15 mL Mouth Rinse BID  . Chlorhexidine Gluconate Cloth  6 each Topical Daily  . cinacalcet  120 mg Oral Q supper  . clopidogrel  75 mg Oral Daily  . docusate sodium  200 mg Oral Daily  . doxercalciferol  2 mcg Intravenous Q M,W,F-HD  . feeding supplement (PRO-STAT SUGAR FREE 64)  30 mL Per Tube BID  . Influenza vac split quadrivalent PF  0.5 mL Intramuscular Tomorrow-1000  . insulin aspart  0-20 Units Subcutaneous Q4H  . insulin detemir  15 Units Subcutaneous BID  . levETIRAcetam  1,000 mg Per Tube Daily  . [START ON 09/23/2017] levETIRAcetam  500 mg Per Tube Q M,W,F-HD  . mouth rinse  15 mL Mouth Rinse QID  . multivitamin  1 tablet Oral QHS  . pantoprazole  40 mg Oral Daily  . risperiDONE  0.5 mg Oral QHS  . sevelamer carbonate  2,400 mg Oral TID WC  . sodium chloride flush  10-40 mL Intracatheter Q12H  . sodium chloride flush  3 mL Intravenous Q12H   . sodium chloride    . sodium chloride Stopped (09/19/17 1700)  . sodium chloride    . sodium chloride    . feeding supplement (NEPRO CARB STEADY) 1,000 mL (09/22/17 0600)  . heparin 1,300 Units/hr (09/22/17 0600)  . lactated ringers 20 mL/hr at 09/13/17 1600  . magnesium sulfate    . norepinephrine (LEVOPHED) Adult infusion 2 mcg/min (09/22/17 0600)  . phenylephrine (NEO-SYNEPHRINE) Adult infusion     sodium chloride, sodium chloride, alteplase, heparin,  lidocaine (PF), lidocaine-prilocaine, ondansetron (ZOFRAN) IV, pentafluoroprop-tetrafluoroeth, sodium chloride flush, sodium chloride flush, traMADol  Exam: GEN: Confused, lying in bed PULM: clear bilat to bases CV: RRR no mrg, sternal scar intact ABD obese soft ntnd EXT: still upper extremity edema L > R LUA AVG +bruit NEURO: follows commands, a little more responsive    Dialysis: Dialysis:[facility] MWF 4h 7mn 95.5kg 2/2 bath LUA AVG Hep none -venofer 50 q wk -calc 0.75 w HD      Impression: 1. CAD sp CABG: on 12/1. Was off pressors, now back on.   2. AMS - multifactorial. CT head no acute changes.  Remains encephalopathic.  3. ESRD HD MWF 4. Vol overload: much better, up 4-5 by wts 5. Hypotension: back on levophed gtt 6. Anemia of CKD: Hb 10 -11 range 7. DM , mult complications 8. Hx CVA's   Plan - HD Monday   RKelly SplinterMD [facility] pager [phone]  09/22/2017, 10:19 AM   Recent Labs  Lab 09/19/17 0558  09/19/17 1945 09/20/17 0400 09/20/17 1628 09/21/17 0320  NA 134*  --   --  134*  --  132*  K 4.8  --   --  4.0  --  3.8  CL 95*  --   --  93*  --  92*  CO2 22  --   --  23  --  22  GLUCOSE 168*  --   --  165*  --  272*  BUN 30*  --   --  20  --  22*  CREATININE 6.56*  --   --  5.19*  --  4.59*  CALCIUM 7.6*  --   --  7.7*  --  7.6*  PHOS  --    < > 3.6 4.1 2.6  --    < > = values in this interval not displayed.   Recent Labs  Lab 09/21/17 0320  AST 25  ALT 20  ALKPHOS 77  BILITOT 0.9  PROT 5.2*  ALBUMIN 2.2*   Recent Labs  Lab 09/20/17 0400 09/21/17 0320 09/22/17 0346  WBC 15.0* 16.9* 20.3*  HGB 10.2* 10.0* 9.8*  HCT 29.8* 28.8* 28.8*  MCV 83.2 83.2 82.8  PLT 256 243 329   Iron/TIBC/Ferritin/ %Sat No results found for: IRON, TIBC, FERRITIN, IRONPCTSAT

## 2017-09-23 ENCOUNTER — Inpatient Hospital Stay (HOSPITAL_COMMUNITY): Payer: Medicare Other

## 2017-09-23 LAB — CBC
HCT: 29 % — ABNORMAL LOW (ref 39.0–52.0)
Hemoglobin: 10 g/dL — ABNORMAL LOW (ref 13.0–17.0)
MCH: 28.2 pg (ref 26.0–34.0)
MCHC: 34.5 g/dL (ref 30.0–36.0)
MCV: 81.7 fL (ref 78.0–100.0)
PLATELETS: 336 10*3/uL (ref 150–400)
RBC: 3.55 MIL/uL — ABNORMAL LOW (ref 4.22–5.81)
RDW: 15.3 % (ref 11.5–15.5)
WBC: 20.8 10*3/uL — ABNORMAL HIGH (ref 4.0–10.5)

## 2017-09-23 LAB — GLUCOSE, CAPILLARY
GLUCOSE-CAPILLARY: 118 mg/dL — AB (ref 65–99)
GLUCOSE-CAPILLARY: 173 mg/dL — AB (ref 65–99)
GLUCOSE-CAPILLARY: 180 mg/dL — AB (ref 65–99)
GLUCOSE-CAPILLARY: 183 mg/dL — AB (ref 65–99)
GLUCOSE-CAPILLARY: 254 mg/dL — AB (ref 65–99)
Glucose-Capillary: 202 mg/dL — ABNORMAL HIGH (ref 65–99)
Glucose-Capillary: 220 mg/dL — ABNORMAL HIGH (ref 65–99)

## 2017-09-23 LAB — HEPARIN LEVEL (UNFRACTIONATED): Heparin Unfractionated: 0.32 IU/mL (ref 0.30–0.70)

## 2017-09-23 LAB — COMPREHENSIVE METABOLIC PANEL
ALT: 17 U/L (ref 17–63)
ANION GAP: 15 (ref 5–15)
AST: 19 U/L (ref 15–41)
Albumin: 2.2 g/dL — ABNORMAL LOW (ref 3.5–5.0)
Alkaline Phosphatase: 82 U/L (ref 38–126)
BUN: 57 mg/dL — ABNORMAL HIGH (ref 6–20)
CHLORIDE: 92 mmol/L — AB (ref 101–111)
CO2: 27 mmol/L (ref 22–32)
Calcium: 8.1 mg/dL — ABNORMAL LOW (ref 8.9–10.3)
Creatinine, Ser: 7.65 mg/dL — ABNORMAL HIGH (ref 0.61–1.24)
GFR, EST AFRICAN AMERICAN: 7 mL/min — AB (ref >60)
GFR, EST NON AFRICAN AMERICAN: 6 mL/min — AB (ref >60)
Glucose, Bld: 142 mg/dL — ABNORMAL HIGH (ref 65–99)
POTASSIUM: 3.6 mmol/L (ref 3.5–5.1)
Sodium: 134 mmol/L — ABNORMAL LOW (ref 135–145)
Total Bilirubin: 0.3 mg/dL (ref 0.3–1.2)
Total Protein: 5.5 g/dL — ABNORMAL LOW (ref 6.5–8.1)

## 2017-09-23 MED ORDER — PENTAFLUOROPROP-TETRAFLUOROETH EX AERO: 1.0000 "application " | INHALATION_SPRAY | CUTANEOUS | Status: DC | PRN

## 2017-09-23 MED ORDER — DOXERCALCIFEROL 4 MCG/2ML IV SOLN
INTRAVENOUS | Status: AC
  Filled 2017-09-23: qty 2

## 2017-09-23 MED ORDER — SODIUM CHLORIDE 0.9 % IV SOLN: 100.0000 mL | INTRAVENOUS | Status: DC | PRN

## 2017-09-23 MED ORDER — ALTEPLASE 2 MG IJ SOLR: 2.0000 mg | Freq: Once | INTRAMUSCULAR | Status: DC | PRN

## 2017-09-23 MED ORDER — LIDOCAINE-PRILOCAINE 2.5-2.5 % EX CREA: 1.0000 "application " | TOPICAL_CREAM | CUTANEOUS | Status: DC | PRN

## 2017-09-23 MED ORDER — LIDOCAINE HCL (PF) 1 % IJ SOLN: 5.0000 mL | INTRAMUSCULAR | Status: DC | PRN

## 2017-09-23 MED ORDER — HEPARIN SODIUM (PORCINE) 1000 UNIT/ML DIALYSIS: 1000.0000 [IU] | INTRAMUSCULAR | Status: DC | PRN

## 2017-09-23 NOTE — Progress Notes (Signed)
ANTICOAGULATION CONSULT NOTE  Pharmacy Consult for heparin  Indication: atrial fibrillation  [name]  Patient Measurements: Height: 5\' 9"  (175.3 cm) Weight: 223 lb 8.7 oz (101.4 kg) IBW/kg (Calculated) : 70.7 Heparin Dosing Weight: 92kg  Vital Signs: Temp: 98.6 F (37 C) (12/10 0757) Temp Source: Oral (12/10 0757) BP: 150/60 (12/10 0955) Pulse Rate: 82 (12/10 0955)  Labs: Recent Labs    09/20/17 1106  09/20/17 1107  09/21/17 0320 09/22/17 0346 09/23/17 0226  HGB  --   --   --    < > 10.0* 9.8* 10.0*  HCT  --   --   --   --  28.8* 28.8* 29.0*  PLT  --   --   --   --  243 329 336  LABPROT 17.2*  --  17.0*  --   --   --   --   INR 1.41  --  1.40  --   --   --   --   HEPARINUNFRC  --    < > 0.62  --  0.45 0.46 0.32  CREATININE  --   --   --   --  4.59*  --  7.65*   < > = values in this interval not displayed.    Estimated Creatinine Clearance: 10.7 mL/min (A) (by C-G formula based on SCr of 7.65 mg/dL (H)).   Medications:  Scheduled:  . feeding supplement (NEPRO CARB STEADY) 1,000 mL (09/23/17 0800)  . heparin 1,300 Units/hr (09/23/17 0800)  . lactated ringers 20 mL/hr at 09/13/17 1600  . magnesium sulfate    . norepinephrine (LEVOPHED) Adult infusion 7 mcg/min (09/23/17 0800)     Assessment: 71 yo male s/p CABG and MV repair on 11/30 and now noted with afib.  Pharmacy consulted to dose heparin.  No overt bleeding or complications noted.  Hgb stable.  Heparin level at goal on 1300 units/hr, trending down now at low end of goal will make adjustment to keep within range.   Goal of Therapy:  Heparin level 0.3-0.7 units/ml Monitor platelets by anticoagulation protocol: Yes   Plan:  -Increase heparin to 1400 units/hr -Daily heparin level and CBC  Erin Hearing PharmD., BCPS Clinical Pharmacist Pager [phone] 09/23/2017 10:03 AM

## 2017-09-23 NOTE — Progress Notes (Signed)
[facility] Progress Note  Subjective: remains confused, on levo gtt to keep SBP > 140 given severe cerebrovasc disease on MRA   Vitals:   09/23/17 0955 09/23/17 1000 09/23/17 1030 09/23/17 1100  BP: (!) 150/60 (!) 141/59 140/62 132/60  Pulse: 82 82 81 81  Resp: 11 10 10 10  Temp:      TempSrc:      SpO2: 97% 97% 94% 95%  Weight:      Height:        Inpatient medications: . doxercalciferol      . amiodarone  200 mg Per NG tube BID  . amitriptyline  25 mg Oral QHS  . aspirin  81 mg Oral Daily  . atorvastatin  80 mg Oral q1800  . bisacodyl  10 mg Oral Daily   Or  . bisacodyl  10 mg Rectal Daily  . chlorhexidine gluconate (MEDLINE KIT)  15 mL Mouth Rinse BID  . Chlorhexidine Gluconate Cloth  6 each Topical Daily  . cinacalcet  120 mg Oral Q supper  . clopidogrel  75 mg Oral Daily  . docusate sodium  200 mg Oral Daily  . doxercalciferol  2 mcg Intravenous Q M,W,F-HD  . feeding supplement (PRO-STAT SUGAR FREE 64)  30 mL Per Tube BID  . Influenza vac split quadrivalent PF  0.5 mL Intramuscular Tomorrow-1000  . insulin aspart  0-20 Units Subcutaneous Q4H  . insulin detemir  10 Units Subcutaneous BID  . levETIRAcetam  1,000 mg Per Tube Daily  . levETIRAcetam  500 mg Per Tube Q M,W,F-HD  . mouth rinse  15 mL Mouth Rinse QID  . multivitamin  1 tablet Oral QHS  . pantoprazole sodium  40 mg Per Tube Daily  . risperiDONE  0.5 mg Oral QHS  . sevelamer carbonate  2,400 mg Oral TID WC  . sodium chloride flush  10-40 mL Intracatheter Q12H   . feeding supplement (NEPRO CARB STEADY) 1,000 mL (09/23/17 1000)  . heparin 1,400 Units/hr (09/23/17 1030)  . lactated ringers 20 mL/hr at 09/13/17 1600  . magnesium sulfate    . norepinephrine (LEVOPHED) Adult infusion 4 mcg/min (09/23/17 1000)   ondansetron (ZOFRAN) IV, sodium chloride flush  Exam: GEN: Confused, lying in bed.  PULM: clear bilat to bases CV: RRR no mrg, sternal scar intact ABD obese soft ntnd EXT: still  upper extremity edema L > R LUA AVG +bruit NEURO: does not follow commands    Dialysis: Dialysis:[facility] MWF 4h 15min 95.5kg 2/2 bath LUA AVG Hep none -venofer 50 q wk -calc 0.75 w HD        Impression: 1. CAD sp CABG: on 12/1. 2. AMS - persistent. No CVA by CT but CTA showed severe cerebrovasc disease, now on levo gtt to keep SBP 140- 160 3. ESRD HD MWF 4. Vol excess/ pulm edema - needs vol off w pulm edema 5. Anemia of CKD: Hb 9 -11 range 6. DM , mult complications 7. Hx CVA's   Plan - HD today,  UF 2-3 L, get vol down, keep SBP up w/ pressors on HD   Rob  MD [facility] pager [phone]   09/23/2017, 11:20 AM   Recent Labs  Lab 09/19/17 1945 09/20/17 0400 09/20/17 1628 09/21/17 0320 09/23/17 0226  NA  --  134*  --  132* 134*  K  --  4.0  --  3.8 3.6  CL  --  93*  --  92* 92*  CO2  --    23  --  22 27  GLUCOSE  --  165*  --  272* 142*  BUN  --  20  --  22* 57*  CREATININE  --  5.19*  --  4.59* 7.65*  CALCIUM  --  7.7*  --  7.6* 8.1*  PHOS 3.6 4.1 2.6  --   --    Recent Labs  Lab 09/21/17 0320 09/23/17 0226  AST 25 19  ALT 20 17  ALKPHOS 77 82  BILITOT 0.9 0.3  PROT 5.2* 5.5*  ALBUMIN 2.2* 2.2*   Recent Labs  Lab 09/21/17 0320 09/22/17 0346 09/23/17 0226  WBC 16.9* 20.3* 20.8*  HGB 10.0* 9.8* 10.0*  HCT 28.8* 28.8* 29.0*  MCV 83.2 82.8 81.7  PLT 243 329 336   Iron/TIBC/Ferritin/ %Sat No results found for: IRON, TIBC, FERRITIN, IRONPCTSAT

## 2017-09-23 NOTE — Plan of Care (Signed)
Pt working with PT/OT to attempt to get back to baseline activity. No s/s of skin breakdown.  Pt tolerating extubation to [place], maintaining O2 sats > 92%. Unable to comprehend pulm. Toilet exercises d/t multiple previous CVAs with residuals. Lungs sounds remain clear throughout.

## 2017-09-23 NOTE — Progress Notes (Signed)
10 Days Post-Op Procedure(s) (LRB): CORONARY ARTERY BYPASS GRAFTING (CABG) times four LIMA  to LAD, SVG sequentially to OM and RAMUS Intermediate and SVG to Acute Marginal (N/A) TRANSESOPHAGEAL ECHOCARDIOGRAM (TEE) (N/A) MITRAL VALVE REPAIR (MVR) (N/A) Subjective: Moving all 4. Completely nonverbal this am  Objective: Vital signs in last 24 hours: Temp:  [97.6 F (36.4 C)-98.6 F (37 C)] 98.6 F (37 C) (12/10 0757) Pulse Rate:  [78-85] 82 (12/10 0757) Cardiac Rhythm: Normal sinus rhythm (12/10 0730) Resp:  [9-24] 13 (12/10 0757) BP: (55-238)/(18-119) 165/40 (12/10 0757) SpO2:  [84 %-100 %] 98 % (12/10 0757) Weight:  [223 lb 8.7 oz (101.4 kg)] 223 lb 8.7 oz (101.4 kg) (12/10 0400)  Hemodynamic parameters for last 24 hours:    Intake/Output from previous day: 12/09 0701 - 12/10 0700 In: 2005.9 [I.V.:395.9; NG/GT:1610] Out: -  Intake/Output this shift: Total I/O In: 69.6 [I.V.:19.6; NG/GT:50] Out: -   General appearance: no distress Neurologic: non verbal, follows simple commands Heart: regular rate and rhythm Lungs: diminished breath sounds bibasilar Abdomen: normal findings: soft, non-tender Wound: clean and dry  Lab Results: Recent Labs    09/22/17 0346 09/23/17 0226  WBC 20.3* 20.8*  HGB 9.8* 10.0*  HCT 28.8* 29.0*  PLT 329 336   BMET:  Recent Labs    09/21/17 0320 09/23/17 0226  NA 132* 134*  K 3.8 3.6  CL 92* 92*  CO2 22 27  GLUCOSE 272* 142*  BUN 22* 57*  CREATININE 4.59* 7.65*  CALCIUM 7.6* 8.1*    PT/INR:  Recent Labs    09/20/17 1107  LABPROT 17.0*  INR 1.40   ABG    Component Value Date/Time   PHART 7.427 09/15/2017 0320   HCO3 24.3 09/15/2017 0320   TCO2 26 09/14/2017 1645   ACIDBASEDEF 4.0 (H) 09/14/2017 0153   O2SAT 75.0 09/20/2017 0805   CBG (last 3)  Recent Labs    09/22/17 2346 09/23/17 0451 09/23/17 0759  GLUCAP 238* 118* 173*    Assessment/Plan: S/P Procedure(s) (LRB): CORONARY ARTERY BYPASS GRAFTING (CABG)  times four LIMA  to LAD, SVG sequentially to OM and RAMUS Intermediate and SVG to Acute Marginal (N/A) TRANSESOPHAGEAL ECHOCARDIOGRAM (TEE) (N/A) MITRAL VALVE REPAIR (MVR) (N/A) -NEURO- no improvement over the weekend  CV- in SR this am- on amiodarone via tube  ASA, plavix + heparin  RESP_ no issues currently, not able to do IS  RENAL- ESRD- HD per Nephrology  ENDO- CBG up with TF, on levemir + SSI   LOS: 15 days    Ernest [name] 09/23/2017

## 2017-09-23 NOTE — Progress Notes (Signed)
PT Cancellation Note  Patient Details Name: Ernest [name] MRN: [id number] DOB: July 09, 1946   Cancelled Treatment:    Reason Eval/Treat Not Completed: Patient at procedure or test/unavailable. Pt receiving HD.   Shary Decamp [place]ok 09/23/2017, 1:14 PM [place]

## 2017-09-24 LAB — CBC
HEMATOCRIT: 28.7 % — AB (ref 39.0–52.0)
Hemoglobin: 10.2 g/dL — ABNORMAL LOW (ref 13.0–17.0)
MCH: 29.1 pg (ref 26.0–34.0)
MCHC: 35.5 g/dL (ref 30.0–36.0)
MCV: 82 fL (ref 78.0–100.0)
Platelets: 387 10*3/uL (ref 150–400)
RBC: 3.5 MIL/uL — ABNORMAL LOW (ref 4.22–5.81)
RDW: 15.7 % — AB (ref 11.5–15.5)
WBC: 25.8 10*3/uL — AB (ref 4.0–10.5)

## 2017-09-24 LAB — GLUCOSE, CAPILLARY
GLUCOSE-CAPILLARY: 196 mg/dL — AB (ref 65–99)
GLUCOSE-CAPILLARY: 199 mg/dL — AB (ref 65–99)
Glucose-Capillary: 142 mg/dL — ABNORMAL HIGH (ref 65–99)
Glucose-Capillary: 171 mg/dL — ABNORMAL HIGH (ref 65–99)
Glucose-Capillary: 181 mg/dL — ABNORMAL HIGH (ref 65–99)
Glucose-Capillary: 221 mg/dL — ABNORMAL HIGH (ref 65–99)

## 2017-09-24 LAB — PROTIME-INR
INR: 1.31
Prothrombin Time: 16.2 seconds — ABNORMAL HIGH (ref 11.4–15.2)

## 2017-09-24 LAB — HEPARIN LEVEL (UNFRACTIONATED)
HEPARIN UNFRACTIONATED: 0.19 [IU]/mL — AB (ref 0.30–0.70)
Heparin Unfractionated: 0.23 IU/mL — ABNORMAL LOW (ref 0.30–0.70)

## 2017-09-24 MED ORDER — RISPERIDONE 1 MG/ML PO SOLN
0.2500 mg | Freq: Every day | ORAL | Status: DC
  Administered 2017-09-24 – 2017-09-25 (×2): 0.25 mg via ORAL
  Filled 2017-09-24 (×2): qty 0.25

## 2017-09-24 MED ORDER — INSULIN DETEMIR 100 UNIT/ML ~~LOC~~ SOLN
15.0000 [IU] | Freq: Two times a day (BID) | SUBCUTANEOUS | Status: DC
  Administered 2017-09-24 – 2017-09-25 (×4): 15 [IU] via SUBCUTANEOUS
  Filled 2017-09-24 (×5): qty 0.15

## 2017-09-24 NOTE — Progress Notes (Signed)
11 Days Post-Op Procedure(s) (LRB): CORONARY ARTERY BYPASS GRAFTING (CABG) times four LIMA  to LAD, SVG sequentially to OM and RAMUS Intermediate and SVG to Acute Marginal (N/A) TRANSESOPHAGEAL ECHOCARDIOGRAM (TEE) (N/A) MITRAL VALVE REPAIR (MVR) (N/A) Subjective: Remains lethargic Denies pain Still minimal verbal and slurred speech but answered "[place]" to question where are you?  Objective: Vital signs in last 24 hours: Temp:  [97.2 F (36.2 C)-98.3 F (36.8 C)] 98.3 F (36.8 C) (12/11 0700) Pulse Rate:  [80-87] 84 (12/11 0700) Cardiac Rhythm: Normal sinus rhythm (12/11 0400) Resp:  [10-22] 14 (12/11 0700) BP: (89-156)/(42-103) 149/59 (12/11 0700) SpO2:  [93 %-100 %] 100 % (12/11 0700) Weight:  [218 lb 4.1 oz (99 kg)-223 lb 8.7 oz (101.4 kg)] 219 lb 5.7 oz (99.5 kg) (12/11 0500)  Hemodynamic parameters for last 24 hours:    Intake/Output from previous day: 12/10 0701 - 12/11 0700 In: 1839 [I.V.:459; NG/GT:1380] Out: 2502 [Stool:2] Intake/Output this shift: No intake/output data recorded.  General appearance: no distress and slowed mentation Neurologic: a little more interactive today Heart: regular rate and rhythm Lungs: diminished breath sounds bibasilar Abdomen: normal findings: soft, non-tender Wound: clean and dry  Lab Results: Recent Labs    09/23/17 0226 09/24/17 0258  WBC 20.8* 25.8*  HGB 10.0* 10.2*  HCT 29.0* 28.7*  PLT 336 387   BMET:  Recent Labs    09/23/17 0226  NA 134*  K 3.6  CL 92*  CO2 27  GLUCOSE 142*  BUN 57*  CREATININE 7.65*  CALCIUM 8.1*    PT/INR:  Recent Labs    09/24/17 0258  LABPROT 16.2*  INR 1.31   ABG    Component Value Date/Time   PHART 7.427 09/15/2017 0320   HCO3 24.3 09/15/2017 0320   TCO2 26 09/14/2017 1645   ACIDBASEDEF 4.0 (H) 09/14/2017 0153   O2SAT 75.0 09/20/2017 0805   CBG (last 3)  Recent Labs    09/23/17 2347 09/24/17 0403 09/24/17 0757  GLUCAP 220* 196* 142*    Assessment/Plan: S/P  Procedure(s) (LRB): CORONARY ARTERY BYPASS GRAFTING (CABG) times four LIMA  to LAD, SVG sequentially to OM and RAMUS Intermediate and SVG to Acute Marginal (N/A) TRANSESOPHAGEAL ECHOCARDIOGRAM (TEE) (N/A) MITRAL VALVE REPAIR (MVR) (N/A) -NEURO- seems a little more interactive today but not a major improvement  TME, possible stroke  On levophed to maintain BP ~140  CV- stable in SR on amiodarone  On ASA, plavix  On heparin drip for atrial flutter, now in SR  RESP- good sats on 4L [place]  RENAL- ESRD- HD per Nephrology  Nutrition- on goal TF  ENDO- CBG elevated- increase levemir   LOS: 16 days    Melrose Nakayama 09/24/2017

## 2017-09-24 NOTE — Progress Notes (Signed)
Physical Therapy Treatment Patient Details Name: Ernest [name] MRN: [id number] DOB: 05-22-1946 Today's Date: 09/24/2017    History of Present Illness Pt adm 11/24 with NSTEMI and underwent CABG x 4 and MVR on 08/18/17. Post op lethargy and confusion. PMH - Multiple CVA's, DM, HTN, chf, ESRD on HD, cad, copd    PT Comments    Pt progressing slowly towards physical therapy goals. Required increased assist this session for transfer OOB to chair. Pt following commands ~75% of the time this session but with minimal verbal interaction. Will continue to follow and progress as able per POC.    Follow Up Recommendations  Supervision/Assistance - 24 hour;SNF     Equipment Recommendations  (TBA at next venue)    Recommendations for Other Services       Precautions / Restrictions Precautions Precautions: Sternal;Fall Restrictions Weight Bearing Restrictions: Yes(sternal precautions) Other Position/Activity Restrictions: Sternal precautions    Mobility  Bed Mobility Overal bed mobility: Needs Assistance Bed Mobility: Supine to Sit     Supine to sit: Mod assist;+2 for physical assistance     General bed mobility comments: Pt required assistance to advance B LEs to edge of bed and to elevate trunk into sitting edge of bed.  Assist to avoid pushing with B UEs to maintain sternal precautions.    Transfers Overall transfer level: Needs assistance Equipment used: None Transfers: Sit to/from [facility] Sit to Stand: Max assist;+2 physical assistance Stand pivot transfers: Max assist;+2 physical assistance       General transfer comment: Initially stood EOB and was unable to gain/maintain standing balance. R knee flexed and L knee appeared hyperextended. Pt was flexed at the trunk as well and required seated rest after ~15 seconds. SPT was then performed to get to drop arm recliner.   Ambulation/Gait             General Gait Details: Unable at this  time   Financial trader Rankin (Stroke Patients Only)       Balance Overall balance assessment: Needs assistance Sitting-balance support: No upper extremity supported;Feet supported Sitting balance-Leahy Scale: Poor Sitting balance - Comments: Required min assistance to maintain balance, patient contiually reaching for support with hands and required assistance to discourage pushing through B UEs.       Standing balance-Leahy Scale: Poor Standing balance comment: +2 external assistance to maintain standing.                              Cognition Arousal/Alertness: Lethargic;Suspect due to medications Behavior During Therapy: Flat affect Overall Cognitive Status: Impaired/Different from baseline Area of Impairment: Attention;Following commands                   Current Attention Level: Focused   Following Commands: Follows one step commands with increased time       General Comments: Pt followed one step commands ~75% of time       Exercises Total Joint Exercises Long Arc Quad: Both;10 reps;Seated;Limitations Long [employer] Limitations: Was not able to demonstrate full knee extension bilaterally    General Comments        Pertinent Vitals/Pain Pain Assessment: Faces Faces Pain Scale: No hurt    Home Living                      Prior  Function            PT Goals (current goals can now be found in the care plan section) Acute Rehab PT Goals Patient Stated Goal: None stated PT Goal Formulation: Patient unable to participate in goal setting Time For Goal Achievement: 10/01/17 Potential to Achieve Goals: Fair Progress towards PT goals: Progressing toward goals    Frequency    Min 3X/week      PT Plan Current plan remains appropriate    Co-evaluation              AM-PAC PT "6 Clicks" Daily Activity  Outcome Measure  Difficulty turning over in bed (including adjusting  bedclothes, sheets and blankets)?: Unable Difficulty moving from lying on back to sitting on the side of the bed? : Unable Difficulty sitting down on and standing up from a chair with arms (e.g., wheelchair, bedside commode, etc,.)?: Unable Help needed moving to and from a bed to chair (including a wheelchair)?: A Lot Help needed walking in hospital room?: A Lot Help needed climbing 3-5 steps with a railing? : Total 6 Click Score: 8    End of Session Equipment Utilized During Treatment: Gait belt Activity Tolerance: Patient limited by lethargy Patient left: in chair;with call bell/phone within reach;with chair alarm set;with restraints reapplied(L wrist restraint, R mitten donned) Nurse Communication: Mobility status PT Visit Diagnosis: Other abnormalities of gait and mobility (R26.89);Muscle weakness (generalized) (M62.81);Difficulty in walking, not elsewhere classified (R26.2)     Time: 0940-7680 PT Time Calculation (min) (ACUTE ONLY): 24 min  Charges:  $Gait Training: 23-37 mins                    G Codes:       Rolinda Roan, PT, DPT Acute Rehabilitation Services Pager: [phone] 09/24/2017, 1:10 PM

## 2017-09-24 NOTE — Progress Notes (Signed)
TCTS BRIEF SICU PROGRESS NOTE  11 Days Post-Op  S/P Procedure(s) (LRB): CORONARY ARTERY BYPASS GRAFTING (CABG) times four LIMA  to LAD, SVG sequentially to OM and RAMUS Intermediate and SVG to Acute Marginal (N/A) TRANSESOPHAGEAL ECHOCARDIOGRAM (TEE) (N/A) MITRAL VALVE REPAIR (MVR) (N/A)   Stable day NSR w/ stable BP although levophed up 16 Breathing comfortably w/ O2 sats 100% on 2 L/min  Plan: Continue current plan  Rexene Alberts, MD 09/24/2017 8:50 PM

## 2017-09-24 NOTE — Progress Notes (Signed)
[facility]  Indication: atrial fibrillation  [name]  Patient Measurements: Height: 5\' 9"  (175.3 cm) Weight: 218 lb 4.1 oz (99 kg) IBW/kg (Calculated) : 70.7 Heparin Dosing Weight: 92kg  Vital Signs: Temp: 97.7 F (36.5 C) (12/10 2014) Temp Source: Oral (12/10 2014) BP: 142/61 (12/11 0200) Pulse Rate: 83 (12/11 0200)  Labs: Recent Labs    09/22/17 0346 09/23/17 0226 09/24/17 0258  HGB 9.8* 10.0* 10.2*  HCT 28.8* 29.0* 28.7*  PLT 329 336 387  LABPROT  --   --  16.2*  INR  --   --  1.31  HEPARINUNFRC 0.46 0.32 0.23*  CREATININE  --  7.65*  --     Estimated Creatinine Clearance: 10.6 mL/min (A) (by C-G formula based on SCr of 7.65 mg/dL (H)).   Medications:  Scheduled:  . sodium chloride    . sodium chloride    . feeding supplement (NEPRO CARB STEADY) 1,000 mL (09/24/17 0024)  . heparin 1,400 Units/hr (09/23/17 1900)  . lactated ringers 20 mL/hr at 09/13/17 1600  . magnesium sulfate    . norepinephrine (LEVOPHED) Adult infusion 6 mcg/min (09/24/17 0025)     Assessment: 71 yo male s/p CABG and MV repair on 11/30 and now noted with afib.  Pharmacy consulted to dose heparin.  No overt bleeding or complications noted.  Hgb stable.  Heparin level low this AM, no issues per RN   Goal of Therapy:  Heparin level 0.3-0.7 units/ml Monitor platelets by anticoagulation protocol: Yes   Plan:  -Increase heparin to 1550 units/hr -1200 HL -Daily heparin level and CBC  Narda Bonds, PharmD, BCPS Clinical Pharmacist Phone: [phone]

## 2017-09-24 NOTE — Progress Notes (Signed)
Nutrition Follow-up  DOCUMENTATION CODES:   Obesity unspecified  INTERVENTION:   Continue current TF regimen: Nepro @ 50 ml/hr Pro-Stat 30 mL BID   NUTRITION DIAGNOSIS:   Increased nutrient needs related to acute illness, chronic illness(CABG, ESRD on HD) as evidenced by estimated needs.  Being addressed via TF  GOAL:   Patient will meet greater than or equal to 90% of their needs  Met via TF  MONITOR:   TF tolerance, Labs, Weight trends, I & O's, Skin  REASON FOR ASSESSMENT:   Consult Enteral/tube feeding initiation and management  ASSESSMENT:   71 yo male admitted NSTEMI, severe CAD s/p CABG on 12/1. Pt with hx of CHF, ESRD on HD, DM, gout, COPD, CAD, CVA with residual weakness  Pt remains confused, minimally verbal Remains on levophed Last HD yesterday , 2.5 L removed CTA showing severe cerebrovascular dz   Tolerating Nepro @ 50 ml/hr with Pro-Stat BID via Cortrak tube  Weight has fluctuated greatly since admission, current wt 99.5 kg; weight on admission 98.4 kg. Sill above EDW of 95.5 kg  Labs: sodium 134, potassium wdl, corrected calcium 9.54 (albumin 2.2),  Phosphorus 2.6 (12/08) Meds: Rena-Vit, renvela, sensipar  Diet Order:  Diet NPO time specified  EDUCATION NEEDS:   No education needs have been identified at this time  Skin:  Skin Assessment: Skin Integrity Issues:(no pressure ulcers) Skin Integrity Issues:: Incisions Incisions: mid sternum, leg  Last BM:  12/11  Height:   Ht Readings from Last 1 Encounters:  09/09/17 _0  (1.753 m)    Weight:   Wt Readings from Last 1 Encounters:  09/24/17 219 lb 5.7 oz (99.5 kg)    Ideal Body Weight:  72.7 kg  BMI:  Body mass index is 32.39 kg/m.  Estimated Nutritional Needs:   Kcal:  2200-2500 kcals  Protein:  125-150 g  Fluid:  1000 mL plus UOP   [employer] MS, RD, LDN, CNSC [phone] Pager  [phone] Weekend/On-Call Pager

## 2017-09-24 NOTE — Progress Notes (Signed)
ANTICOAGULATION CONSULT NOTE  Pharmacy Consult for Heparin  Indication: atrial fibrillation  [name]  Patient Measurements: Height: 5\' 9"  (175.3 cm) Weight: 219 lb 5.7 oz (99.5 kg) IBW/kg (Calculated) : 70.7 Heparin Dosing Weight: 92kg  Vital Signs: Temp: 98.3 F (36.8 C) (12/11 0700) Temp Source: Oral (12/11 0700) BP: 149/59 (12/11 0700) Pulse Rate: 84 (12/11 0700)  Labs: Recent Labs    09/22/17 0346 09/23/17 0226 09/24/17 0258  HGB 9.8* 10.0* 10.2*  HCT 28.8* 29.0* 28.7*  PLT 329 336 387  LABPROT  --   --  16.2*  INR  --   --  1.31  HEPARINUNFRC 0.46 0.32 0.23*  CREATININE  --  7.65*  --     Estimated Creatinine Clearance: 10.6 mL/min (A) (by C-G formula based on SCr of 7.65 mg/dL (H)).   Medications:  Scheduled:  . sodium chloride    . sodium chloride    . feeding supplement (NEPRO CARB STEADY) 1,000 mL (09/24/17 0024)  . heparin 1,550 Units/hr (09/24/17 0358)  . lactated ringers 20 mL/hr at 09/13/17 1600  . magnesium sulfate    . norepinephrine (LEVOPHED) Adult infusion 6 mcg/min (09/24/17 0025)     Assessment: 71 yo male s/p CABG and MV repair on 11/30 and now noted with afib.  Pharmacy consulted to dose heparin.  No overt bleeding or complications noted.  Hgb stable.  Heparin level low again this afternoon, no issues per RN   Goal of Therapy:  Heparin level 0.3-0.7 units/ml Monitor platelets by anticoagulation protocol: Yes   Plan:  Increase heparin to 1750 units/hr Heparin level at midnight  Erin Hearing PharmD., BCPS Clinical Pharmacist Pager [phone] 09/24/2017 10:13 AM

## 2017-09-24 NOTE — Progress Notes (Signed)
[name] Progress Note  Subjective: 2.5 L off with HD yest, , down to 99kg today. Up in chair, 1st time, and responding verbally although slow to respond.    Vitals:   09/24/17 0900 09/24/17 1000 09/24/17 1100 09/24/17 1140  BP: (!) 135/56 (!) 130/57 118/81   Pulse: 84 84    Resp: _0 Temp:    98.6 F (37 C)  TempSrc:    Oral  SpO2: 100% 100% 100%   Weight:      Height:        Inpatient medications: . amiodarone  200 mg Per NG tube BID  . amitriptyline  25 mg Oral QHS  . aspirin  81 mg Oral Daily  . atorvastatin  80 mg Oral q1800  . bisacodyl  10 mg Oral Daily   Or  . bisacodyl  10 mg Rectal Daily  . chlorhexidine gluconate (MEDLINE KIT)  15 mL Mouth Rinse BID  . Chlorhexidine Gluconate Cloth  6 each Topical Daily  . cinacalcet  120 mg Oral Q supper  . clopidogrel  75 mg Oral Daily  . docusate sodium  200 mg Oral Daily  . doxercalciferol  2 mcg Intravenous Q M,W,F-HD  . feeding supplement (PRO-STAT SUGAR FREE 64)  30 mL Per Tube BID  . Influenza vac split quadrivalent PF  0.5 mL Intramuscular Tomorrow-1000  . insulin aspart  0-20 Units Subcutaneous Q4H  . insulin detemir  15 Units Subcutaneous BID  . levETIRAcetam  1,000 mg Per Tube Daily  . levETIRAcetam  500 mg Per Tube Q M,W,F-HD  . mouth rinse  15 mL Mouth Rinse QID  . multivitamin  1 tablet Oral QHS  . pantoprazole sodium  40 mg Per Tube Daily  . risperiDONE  0.25 mg Oral QHS  . sevelamer carbonate  2,400 mg Oral TID WC  . sodium chloride flush  10-40 mL Intracatheter Q12H   . sodium chloride    . sodium chloride    . feeding supplement (NEPRO CARB STEADY) 1,000 mL (09/24/17 1100)  . heparin 1,550 Units/hr (09/24/17 1100)  . lactated ringers 20 mL/hr at 09/13/17 1600  . magnesium sulfate    . norepinephrine (LEVOPHED) Adult infusion 12 mcg/min (09/24/17 1101)   sodium chloride, sodium chloride, alteplase, heparin, lidocaine (PF), lidocaine-prilocaine, ondansetron (ZOFRAN) IV,  pentafluoroprop-tetrafluoroeth, sodium chloride flush  Exam: GEN: up in chair, opens eyes to voice, mutters a few words, tracks PULM: clear bilat to bases CV: RRR no mrg, sternal scar intact ABD obese soft ntnd EXT: 1-2+ edema  LUA AVG +bruit NEURO: follows simple commands, moves all ext  Home meds:  -metoprolol 25 bid -humalog tid/ lantus 10 hs -allopurinol 100/ lipitor 40/ colchicine biw/ MVI/ renvela ac/ sensipar 60 bid -ecasa 81/ plavix 75 -elavil 25 hs prn neuropath pain  Dialysis:[facility] MWF 4h 64mn 95.5kg 2/2 bath LUA AVG Hep none -venofer 50 q wk -calc 0.75 w HD    Impression: 1. CAD sp CABG: on 12/1. 2. AMS - a bit better today, on levophed to keep SBP 140- 160 given poor MS and CTA showing severe cerebrovasc disease. Head CT no acute CVA.  3. ESRD HD MWF 4. Vol excess/ pulm edema - repeat CXR, cont remove vol w HD tomorrow 5. Anemia of CKD: Hb 9 -11 range, no esa for now 6. DM , mult complications 7. Hx prior CVA's   Plan - HD Wed, pull 2-3 L as tolerated w pressor support   Rob SDoctor, hospital  MD [name] pager [phone]   09/24/2017, 11:54 AM   Recent Labs  Lab 09/19/17 1945 09/20/17 0400 09/20/17 1628 09/21/17 0320 09/23/17 0226  NA  --  134*  --  132* 134*  K  --  4.0  --  3.8 3.6  CL  --  93*  --  92* 92*  CO2  --  23  --  22 27  GLUCOSE  --  165*  --  272* 142*  BUN  --  20  --  22* 57*  CREATININE  --  5.19*  --  4.59* 7.65*  CALCIUM  --  7.7*  --  7.6* 8.1*  PHOS 3.6 4.1 2.6  --   --    Recent Labs  Lab 09/21/17 0320 09/23/17 0226  AST 25 19  ALT 20 17  ALKPHOS 77 82  BILITOT 0.9 0.3  PROT 5.2* 5.5*  ALBUMIN 2.2* 2.2*   Recent Labs  Lab 09/22/17 0346 09/23/17 0226 09/24/17 0258  WBC 20.3* 20.8* 25.8*  HGB 9.8* 10.0* 10.2*  HCT 28.8* 29.0* 28.7*  MCV 82.8 81.7 82.0  PLT 329 336 387   Iron/TIBC/Ferritin/ %Sat No results found for: IRON, TIBC, FERRITIN, IRONPCTSAT

## 2017-09-25 ENCOUNTER — Inpatient Hospital Stay (HOSPITAL_COMMUNITY): Payer: Medicare Other

## 2017-09-25 LAB — GLUCOSE, CAPILLARY
GLUCOSE-CAPILLARY: 180 mg/dL — AB (ref 65–99)
GLUCOSE-CAPILLARY: 190 mg/dL — AB (ref 65–99)
Glucose-Capillary: 164 mg/dL — ABNORMAL HIGH (ref 65–99)
Glucose-Capillary: 185 mg/dL — ABNORMAL HIGH (ref 65–99)
Glucose-Capillary: 200 mg/dL — ABNORMAL HIGH (ref 65–99)
Glucose-Capillary: 223 mg/dL — ABNORMAL HIGH (ref 65–99)

## 2017-09-25 LAB — BASIC METABOLIC PANEL
Anion gap: 13 (ref 5–15)
BUN: 62 mg/dL — AB (ref 6–20)
CALCIUM: 8.3 mg/dL — AB (ref 8.9–10.3)
CO2: 27 mmol/L (ref 22–32)
CREATININE: 6.52 mg/dL — AB (ref 0.61–1.24)
Chloride: 90 mmol/L — ABNORMAL LOW (ref 101–111)
GFR calc Af Amer: 9 mL/min — ABNORMAL LOW (ref >60)
GFR, EST NON AFRICAN AMERICAN: 8 mL/min — AB (ref >60)
GLUCOSE: 209 mg/dL — AB (ref 65–99)
Potassium: 3.6 mmol/L (ref 3.5–5.1)
SODIUM: 130 mmol/L — AB (ref 135–145)

## 2017-09-25 LAB — CBC
HCT: 27.6 % — ABNORMAL LOW (ref 39.0–52.0)
Hemoglobin: 9.7 g/dL — ABNORMAL LOW (ref 13.0–17.0)
MCH: 28.3 pg (ref 26.0–34.0)
MCHC: 35.1 g/dL (ref 30.0–36.0)
MCV: 80.5 fL (ref 78.0–100.0)
PLATELETS: 406 10*3/uL — AB (ref 150–400)
RBC: 3.43 MIL/uL — AB (ref 4.22–5.81)
RDW: 15.5 % (ref 11.5–15.5)
WBC: 23.5 10*3/uL — ABNORMAL HIGH (ref 4.0–10.5)

## 2017-09-25 LAB — HEPARIN LEVEL (UNFRACTIONATED): Heparin Unfractionated: 0.54 IU/mL (ref 0.30–0.70)

## 2017-09-25 MED ORDER — LIDOCAINE HCL (PF) 1 % IJ SOLN: 5.0000 mL | INTRAMUSCULAR | Status: DC | PRN

## 2017-09-25 MED ORDER — LIDOCAINE-PRILOCAINE 2.5-2.5 % EX CREA: 1.0000 "application " | TOPICAL_CREAM | CUTANEOUS | Status: DC | PRN

## 2017-09-25 MED ORDER — DOXERCALCIFEROL 4 MCG/2ML IV SOLN
INTRAVENOUS | Status: AC
  Administered 2017-09-25: 2 ug via INTRAVENOUS
  Filled 2017-09-25: qty 2

## 2017-09-25 MED ORDER — PATIENT'S GUIDE TO USING COUMADIN BOOK
Freq: Once | Status: AC
  Administered 2017-09-25: 11:00:00
  Filled 2017-09-25: qty 1

## 2017-09-25 MED ORDER — HEPARIN SODIUM (PORCINE) 1000 UNIT/ML DIALYSIS: 1000.0000 [IU] | INTRAMUSCULAR | Status: DC | PRN

## 2017-09-25 MED ORDER — ALTEPLASE 2 MG IJ SOLR: 2.0000 mg | Freq: Once | INTRAMUSCULAR | Status: DC | PRN

## 2017-09-25 MED ORDER — WARFARIN - PHYSICIAN DOSING INPATIENT
Freq: Every day | Status: DC
  Administered 2017-09-28: 1
  Administered 2017-09-29 – 2017-10-01 (×2)

## 2017-09-25 MED ORDER — WARFARIN SODIUM 2.5 MG PO TABS
2.5000 mg | ORAL_TABLET | Freq: Every day | ORAL | Status: DC
  Administered 2017-09-25 – 2017-09-26 (×2): 2.5 mg via ORAL
  Filled 2017-09-25 (×2): qty 1

## 2017-09-25 MED ORDER — SODIUM CHLORIDE 0.9 % IV SOLN: 100.0000 mL | INTRAVENOUS | Status: DC | PRN

## 2017-09-25 MED ORDER — PENTAFLUOROPROP-TETRAFLUOROETH EX AERO: 1.0000 "application " | INHALATION_SPRAY | CUTANEOUS | Status: DC | PRN

## 2017-09-25 MED ORDER — MIDODRINE HCL 5 MG PO TABS
10.0000 mg | ORAL_TABLET | Freq: Three times a day (TID) | ORAL | Status: DC
  Administered 2017-09-25 (×2): 10 mg via ORAL
  Filled 2017-09-25 (×3): qty 2

## 2017-09-25 NOTE — Progress Notes (Signed)
12 Days Post-Op Procedure(s) (LRB): CORONARY ARTERY BYPASS GRAFTING (CABG) times four LIMA  to LAD, SVG sequentially to OM and RAMUS Intermediate and SVG to Acute Marginal (N/A) TRANSESOPHAGEAL ECHOCARDIOGRAM (TEE) (N/A) MITRAL VALVE REPAIR (MVR) (N/A) Subjective:  Relatively alert this morning Answers name, place, not year. Speech still slurred  Objective: Vital signs in last 24 hours: Temp:  [98.1 F (36.7 C)-98.8 F (37.1 C)] 98.3 F (36.8 C) (12/12 0343) Pulse Rate:  [80-106] 82 (12/12 0700) Cardiac Rhythm: Normal sinus rhythm (12/12 0400) Resp:  [10-28] 21 (12/12 0700) BP: (103-171)/(39-121) 147/57 (12/12 0700) SpO2:  [99 %-100 %] 100 % (12/12 0700) Weight:  [229 lb 0.9 oz (103.9 kg)] 229 lb 0.9 oz (103.9 kg) (12/12 0500)  Hemodynamic parameters for last 24 hours:    Intake/Output from previous day: 12/11 0701 - 12/12 0700 In: 1829.5 [I.V.:629.5; NG/GT:1200] Out: -  Intake/Output this shift: No intake/output data recorded.  General appearance: cooperative and slowed mentation Neurologic: speech marginally clearer Heart: regular rate and rhythm Lungs: diminished breath sounds bibasilar Abdomen: normal findings: soft, non-tender Wound: clean and dry  Lab Results: Recent Labs    09/24/17 0258 09/25/17 0233  WBC 25.8* 23.5*  HGB 10.2* 9.7*  HCT 28.7* 27.6*  PLT 387 406*   BMET:  Recent Labs    09/23/17 0226 09/25/17 0233  NA 134* 130*  K 3.6 3.6  CL 92* 90*  CO2 27 27  GLUCOSE 142* 209*  BUN 57* 62*  CREATININE 7.65* 6.52*  CALCIUM 8.1* 8.3*    PT/INR:  Recent Labs    09/24/17 0258  LABPROT 16.2*  INR 1.31   ABG    Component Value Date/Time   PHART 7.427 09/15/2017 0320   HCO3 24.3 09/15/2017 0320   TCO2 26 09/14/2017 1645   ACIDBASEDEF 4.0 (H) 09/14/2017 0153   O2SAT 75.0 09/20/2017 0805   CBG (last 3)  Recent Labs    09/24/17 2004 09/24/17 2331 09/25/17 0342  GLUCAP 171* 221* 180*    Assessment/Plan: S/P Procedure(s)  (LRB): CORONARY ARTERY BYPASS GRAFTING (CABG) times four LIMA  to LAD, SVG sequentially to OM and RAMUS Intermediate and SVG to Acute Marginal (N/A) TRANSESOPHAGEAL ECHOCARDIOGRAM (TEE) (N/A) MITRAL VALVE REPAIR (MVR) (N/A) -NEURO- TME, possible CVA, multiple prior CVAs  He looks better over the past 2 days but still below baseline  Will wean norepi drip today  Continue PT/OT, would be reasonable to reevaluate swallowing  CV- in SR on amiodarone- dc external pacer  Start warfarin, continue heparin until INR > 2  RESP- Still has left lower lobe atelectasis  RENAL- ESRD- for HD today  ENDO- CBG elevated despite increase in levemir  Leukocytosis- WBC down slightly today, no fevers- continue to monitor  Nutrition- on goal TF   LOS: 17 days    Melrose Nakayama 09/25/2017

## 2017-09-25 NOTE — Progress Notes (Signed)
[facility] Progress Note  Subjective: on HD now, not responding very well to voice / touch   Vitals:   09/25/17 1245 09/25/17 1300 09/25/17 1310 09/25/17 1315  BP: (!) 110/48 94/68 (!) 118/47 (!) 109/56  Pulse: 84 85  84  Resp: '17 16  15  ' Temp:      TempSrc:      SpO2: 100% 100%  100%  Weight:      Height:        Inpatient medications: . amiodarone  200 mg Per NG tube BID  . amitriptyline  25 mg Oral QHS  . aspirin  81 mg Oral Daily  . atorvastatin  80 mg Oral q1800  . bisacodyl  10 mg Oral Daily   Or  . bisacodyl  10 mg Rectal Daily  . chlorhexidine gluconate (MEDLINE KIT)  15 mL Mouth Rinse BID  . Chlorhexidine Gluconate Cloth  6 each Topical Daily  . cinacalcet  120 mg Oral Q supper  . clopidogrel  75 mg Oral Daily  . docusate sodium  200 mg Oral Daily  . doxercalciferol  2 mcg Intravenous Q M,W,F-HD  . feeding supplement (PRO-STAT SUGAR FREE 64)  30 mL Per Tube BID  . Influenza vac split quadrivalent PF  0.5 mL Intramuscular Tomorrow-1000  . insulin aspart  0-20 Units Subcutaneous Q4H  . insulin detemir  15 Units Subcutaneous BID  . levETIRAcetam  1,000 mg Per Tube Daily  . levETIRAcetam  500 mg Per Tube Q M,W,F-HD  . mouth rinse  15 mL Mouth Rinse QID  . midodrine  10 mg Oral TID WC  . multivitamin  1 tablet Oral QHS  . pantoprazole sodium  40 mg Per Tube Daily  . risperiDONE  0.25 mg Oral QHS  . sevelamer carbonate  2,400 mg Oral TID WC  . sodium chloride flush  10-40 mL Intracatheter Q12H  . warfarin  2.5 mg Oral q1800  . Warfarin - Physician Dosing Inpatient   Does not apply q1800   . sodium chloride    . sodium chloride    . sodium chloride    . sodium chloride    . feeding supplement (NEPRO CARB STEADY) 1,000 mL (09/25/17 1200)  . heparin 1,750 Units/hr (09/25/17 1200)  . lactated ringers 20 mL/hr at 09/13/17 1600  . magnesium sulfate    . norepinephrine (LEVOPHED) Adult infusion Stopped (09/25/17 0943)   sodium chloride, sodium  chloride, sodium chloride, sodium chloride, alteplase, heparin, lidocaine (PF), lidocaine-prilocaine, ondansetron (ZOFRAN) IV, pentafluoroprop-tetrafluoroeth, sodium chloride flush  Exam: GEN: up in chair, opens eyes to voice, mutters a few words, tracks PULM: clear bilat to bases CV: RRR no mrg, sternal scar intact ABD obese soft ntnd EXT: 1-2+ edema  LUA AVG +bruit NEURO: follows simple commands, moves all ext  Home meds:  -metoprolol 25 bid -humalog tid/ lantus 10 hs -allopurinol 100/ lipitor 40/ colchicine biw/ MVI/ renvela ac/ sensipar 60 bid -ecasa 81/ plavix 75 -elavil 25 hs prn neuropath pain  CXR 12/12 - vasc congestion  Dialysis:[facility] MWF 4h 84mn 95.5kg 2/2 bath LUA AVG Hep none -venofer 50 q wk -calc 0.75 w HD    Impression: 1. CAD sp CABG: on 12/1. 2. AMS - no sig improvement. Off pressors. Head CT no CVA but CTA showed severe cerebrovasc disease. Will add midodrine for BP support.  3. ESRD HD MWF 4. Volume - up 8kg if wts correct, not sure they are 5. Anemia of CKD: Hb 9 -11 range, no  esa for now 6. DM , mult complications 7. Hx prior CVA's   Plan - HD today, UF as Ronnie Derby MD [employer] pager [phone]   09/25/2017, 1:28 PM   Recent Labs  Lab 09/19/17 1945 09/20/17 0400 09/20/17 1628 09/21/17 0320 09/23/17 0226 09/25/17 0233  NA  --  134*  --  132* 134* 130*  K  --  4.0  --  3.8 3.6 3.6  CL  --  93*  --  92* 92* 90*  CO2  --  23  --  '22 27 27  ' GLUCOSE  --  165*  --  272* 142* 209*  BUN  --  20  --  22* 57* 62*  CREATININE  --  5.19*  --  4.59* 7.65* 6.52*  CALCIUM  --  7.7*  --  7.6* 8.1* 8.3*  PHOS 3.6 4.1 2.6  --   --   --    Recent Labs  Lab 09/21/17 0320 09/23/17 0226  AST 25 19  ALT 20 17  ALKPHOS 77 82  BILITOT 0.9 0.3  PROT 5.2* 5.5*  ALBUMIN 2.2* 2.2*   Recent Labs  Lab 09/23/17 0226 09/24/17 0258 09/25/17 0233  WBC 20.8* 25.8* 23.5*  HGB 10.0* 10.2* 9.7*  HCT 29.0* 28.7* 27.6*  MCV  81.7 82.0 80.5  PLT 336 387 406*   Iron/TIBC/Ferritin/ %Sat No results found for: IRON, TIBC, FERRITIN, IRONPCTSAT

## 2017-09-25 NOTE — Progress Notes (Signed)
OT Cancellation Note  Patient Details Name: [name] MRN: [id number] DOB: 06-15-1946   Cancelled Treatment:    Reason Eval/Treat Not Completed: Patient at procedure or test/ unavailable.  Pt undergoing HD.  Will reattempt.  [place], OTR/L [phone]   Lucille Passy M 09/25/2017, 11:24 AM

## 2017-09-25 NOTE — Progress Notes (Signed)
SLP Cancellation Note  Patient Details Name: KEYTON BHAT MRN: [id number] DOB: 08-27-1946   Cancelled treatment:        Attempted swallow evaluation. Pt receiving HD in room and HD RN states pt's blood pressure drops when head of bed is raised. HD will be completed in 30 min. Pt has Cortrak, and has had a busy day per RN. Will continue efforts tomorrow.    [place] Siren 09/25/2017, 2:25 PM  Orbie Pyo Colvin Caroli.Ed [employer] [phone]

## 2017-09-25 NOTE — Progress Notes (Signed)
ANTICOAGULATION CONSULT NOTE  Pharmacy Consult for Heparin  Indication: atrial fibrillation  [name]  Patient Measurements: Height: 5\' 9"  (175.3 cm) Weight: 219 lb 5.7 oz (99.5 kg) IBW/kg (Calculated) : 70.7 Heparin Dosing Weight: 92kg  Vital Signs: Temp: 98.1 F (36.7 C) (12/11 2333) Temp Source: Oral (12/11 2333) BP: 154/56 (12/12 0300) Pulse Rate: 85 (12/12 0300)  Labs: Recent Labs    09/23/17 0226 09/24/17 0258 09/24/17 1427 09/25/17 0233  HGB 10.0* 10.2*  --  9.7*  HCT 29.0* 28.7*  --  27.6*  PLT 336 387  --  406*  LABPROT  --  16.2*  --   --   INR  --  1.31  --   --   HEPARINUNFRC 0.32 0.23* 0.19* 0.54  CREATININE 7.65*  --   --   --     Estimated Creatinine Clearance: 10.6 mL/min (A) (by C-G formula based on SCr of 7.65 mg/dL (H)).  Assessment: 71 yo male s/p CABG and MV repair on 11/30 with Afib for heparin Goal of Therapy:  Heparin level 0.3-0.7 units/ml Monitor platelets by anticoagulation protocol: Yes   Plan:  Continue Heparin at current rate   Phillis Knack, PharmD, BCPS

## 2017-09-25 NOTE — Progress Notes (Signed)
Patient ID: [name], male   DOB: 08-Feb-1946, 71 y.o.   MRN: [id number] TCTS Evening Rounds:  Hemodynamically stable off levophed  sats 100% RA  More alert and oriented today.  Remains on tube feeds, heparin drip.

## 2017-09-26 LAB — BASIC METABOLIC PANEL
Anion gap: 11 (ref 5–15)
BUN: 40 mg/dL — ABNORMAL HIGH (ref 6–20)
CALCIUM: 8.8 mg/dL — AB (ref 8.9–10.3)
CHLORIDE: 93 mmol/L — AB (ref 101–111)
CO2: 29 mmol/L (ref 22–32)
Creatinine, Ser: 4.28 mg/dL — ABNORMAL HIGH (ref 0.61–1.24)
GFR calc non Af Amer: 13 mL/min — ABNORMAL LOW (ref >60)
GFR, EST AFRICAN AMERICAN: 15 mL/min — AB (ref >60)
GLUCOSE: 232 mg/dL — AB (ref 65–99)
Potassium: 3.8 mmol/L (ref 3.5–5.1)
Sodium: 133 mmol/L — ABNORMAL LOW (ref 135–145)

## 2017-09-26 LAB — GLUCOSE, CAPILLARY
GLUCOSE-CAPILLARY: 229 mg/dL — AB (ref 65–99)
GLUCOSE-CAPILLARY: 162 mg/dL — AB (ref 65–99)
Glucose-Capillary: 203 mg/dL — ABNORMAL HIGH (ref 65–99)
Glucose-Capillary: 211 mg/dL — ABNORMAL HIGH (ref 65–99)
Glucose-Capillary: 120 mg/dL — ABNORMAL HIGH (ref 65–99)

## 2017-09-26 LAB — CBC
HEMATOCRIT: 27 % — AB (ref 39.0–52.0)
HEMOGLOBIN: 9.6 g/dL — AB (ref 13.0–17.0)
MCH: 28.5 pg (ref 26.0–34.0)
MCHC: 35.6 g/dL (ref 30.0–36.0)
MCV: 80.1 fL (ref 78.0–100.0)
Platelets: 426 10*3/uL — ABNORMAL HIGH (ref 150–400)
RBC: 3.37 MIL/uL — AB (ref 4.22–5.81)
RDW: 15.7 % — ABNORMAL HIGH (ref 11.5–15.5)
WBC: 22.9 10*3/uL — ABNORMAL HIGH (ref 4.0–10.5)

## 2017-09-26 LAB — PROTIME-INR
INR: 1.27
PROTHROMBIN TIME: 15.8 s — AB (ref 11.4–15.2)

## 2017-09-26 LAB — HEPARIN LEVEL (UNFRACTIONATED): Heparin Unfractionated: 0.6 IU/mL (ref 0.30–0.70)

## 2017-09-26 MED ORDER — INSULIN DETEMIR 100 UNIT/ML ~~LOC~~ SOLN
20.0000 [IU] | Freq: Two times a day (BID) | SUBCUTANEOUS | Status: DC
  Administered 2017-09-26 – 2017-10-03 (×15): 20 [IU] via SUBCUTANEOUS
  Filled 2017-09-26 (×17): qty 0.2

## 2017-09-26 MED ORDER — MIDODRINE HCL 5 MG PO TABS
5.0000 mg | ORAL_TABLET | Freq: Three times a day (TID) | ORAL | Status: DC
  Administered 2017-09-26 (×2): 5 mg via ORAL
  Filled 2017-09-26: qty 1

## 2017-09-26 NOTE — Progress Notes (Signed)
13 Days Post-Op Procedure(s) (LRB): CORONARY ARTERY BYPASS GRAFTING (CABG) times four LIMA  to LAD, SVG sequentially to OM and RAMUS Intermediate and SVG to Acute Marginal (N/A) TRANSESOPHAGEAL ECHOCARDIOGRAM (TEE) (N/A) MITRAL VALVE REPAIR (MVR) (N/A) Subjective: More alert. Speaking spontaneously Still dysarthric, but improved  Objective: Vital signs in last 24 hours: Temp:  [97.6 F (36.4 C)-98 F (36.7 C)] 97.6 F (36.4 C) (12/13 0423) Pulse Rate:  [78-95] 79 (12/13 0700) Cardiac Rhythm: Normal sinus rhythm (12/13 0400) Resp:  [12-22] 20 (12/13 0700) BP: (94-172)/(46-107) 172/75 (12/13 0700) SpO2:  [100 %] 100 % (12/13 0700) Weight:  [223 lb 12.3 oz (101.5 kg)-229 lb 0.9 oz (103.9 kg)] 225 lb 1.4 oz (102.1 kg) (12/13 0423)  Hemodynamic parameters for last 24 hours:    Intake/Output from previous day: 12/12 0701 - 12/13 0700 In: 1689.4 [I.V.:429.4; NG/GT:1260] Out: 2500  Intake/Output this shift: No intake/output data recorded.  General appearance: alert, cooperative and no distress Neurologic: see HPI Heart: regular rate and rhythm Lungs: diminished breath sounds bibasilar Abdomen: normal findings: soft, non-tender Extremities: LUE edematous Wound: clean and dry  Lab Results: Recent Labs    09/25/17 0233 09/26/17 0357  WBC 23.5* 22.9*  HGB 9.7* 9.6*  HCT 27.6* 27.0*  PLT 406* 426*   BMET:  Recent Labs    09/25/17 0233 09/26/17 0357  NA 130* 133*  K 3.6 3.8  CL 90* 93*  CO2 27 29  GLUCOSE 209* 232*  BUN 62* 40*  CREATININE 6.52* 4.28*  CALCIUM 8.3* 8.8*    PT/INR:  Recent Labs    09/26/17 0357  LABPROT 15.8*  INR 1.27   ABG    Component Value Date/Time   PHART 7.427 09/15/2017 0320   HCO3 24.3 09/15/2017 0320   TCO2 26 09/14/2017 1645   ACIDBASEDEF 4.0 (H) 09/14/2017 0153   O2SAT 75.0 09/20/2017 0805   CBG (last 3)  Recent Labs    09/25/17 2352 09/26/17 0401 09/26/17 0744  GLUCAP 223* 229* 203*    Assessment/Plan: S/P  Procedure(s) (LRB): CORONARY ARTERY BYPASS GRAFTING (CABG) times four LIMA  to LAD, SVG sequentially to OM and RAMUS Intermediate and SVG to Acute Marginal (N/A) TRANSESOPHAGEAL ECHOCARDIOGRAM (TEE) (N/A) MITRAL VALVE REPAIR (MVR) (N/A) -NEURO- TME- continues to improve  Passed swallow eval- start pO  Dc risperidol  CV- stable in SR  BP up- will decrease midodrine  Coumadin for atrial flutter + MV ring  RESP- able to cooperate with pulmonary hygiene  RENAL- HD yesterday  ENDO- CBG still elevated- increase levemir to 20 BID  Nutrition- continue TF until demonstrates that he can take adequate PO  Deconditioning- continue PT/OT   LOS: 18 days    Melrose Nakayama 09/26/2017

## 2017-09-26 NOTE — Consult Note (Signed)
   [facility] CM Inpatient Consult   09/26/2017  Ernest [name] 06-10-1946 [id number]    Spoke with inpatient RNCM Steffanie Dunn) who indicates the MD Director requests that [facility] Management follow up with patient post hospital discharge.  Patient currently in ICU. Discussed that it does not appear patient has a PCP. Inpatient RNCM states there may not be one listed. Will follow up.  Will continue to follow and cross check for [facility] Care Management eligibility.   Marthenia Rolling, MSN-Ed, RN,BSN [facility] Liaison [phone]

## 2017-09-26 NOTE — Progress Notes (Signed)
[facility] Progress Note  Subjective: more interactive , eating breakfast today   Vitals:   09/26/17 0600 09/26/17 0610 09/26/17 0700 09/26/17 0725  BP: (!) 170/67 (!) 156/69 (!) 172/75   Pulse: 82 84 79   Resp: _0 Temp:    97.9 F (36.6 C)  TempSrc:    Oral  SpO2: 100% 100% 100%   Weight:      Height:        Inpatient medications: . amiodarone  200 mg Per NG tube BID  . amitriptyline  25 mg Oral QHS  . aspirin  81 mg Oral Daily  . atorvastatin  80 mg Oral q1800  . bisacodyl  10 mg Oral Daily   Or  . bisacodyl  10 mg Rectal Daily  . chlorhexidine gluconate (MEDLINE KIT)  15 mL Mouth Rinse BID  . cinacalcet  120 mg Oral Q supper  . clopidogrel  75 mg Oral Daily  . docusate sodium  200 mg Oral Daily  . doxercalciferol  2 mcg Intravenous Q M,W,F-HD  . feeding supplement (PRO-STAT SUGAR FREE 64)  30 mL Per Tube BID  . Influenza vac split quadrivalent PF  0.5 mL Intramuscular Tomorrow-1000  . insulin aspart  0-20 Units Subcutaneous Q4H  . insulin detemir  20 Units Subcutaneous BID  . levETIRAcetam  1,000 mg Per Tube Daily  . levETIRAcetam  500 mg Per Tube Q M,W,F-HD  . mouth rinse  15 mL Mouth Rinse QID  . midodrine  5 mg Oral TID WC  . multivitamin  1 tablet Oral QHS  . pantoprazole sodium  40 mg Per Tube Daily  . sevelamer carbonate  2,400 mg Oral TID WC  . warfarin  2.5 mg Oral q1800  . Warfarin - Physician Dosing Inpatient   Does not apply q1800   . sodium chloride    . sodium chloride    . sodium chloride    . sodium chloride    . feeding supplement (NEPRO CARB STEADY) 1,000 mL (09/26/17 0600)  . heparin 1,750 Units/hr (09/26/17 1016)  . lactated ringers 20 mL/hr at 09/13/17 1600  . magnesium sulfate    . norepinephrine (LEVOPHED) Adult infusion Stopped (09/25/17 0943)   sodium chloride, sodium chloride, sodium chloride, sodium chloride, alteplase, heparin, lidocaine (PF), lidocaine-prilocaine, ondansetron (ZOFRAN) IV,  pentafluoroprop-tetrafluoroeth  Exam: GEN: up in chair, opens eyes to voice, mutters a few words, tracks PULM: clear bilat to bases CV: RRR no mrg, sternal scar intact ABD obese soft ntnd EXT: 1-2+ edema  LUA AVG +bruit NEURO: follows simple commands, moves all ext  Home meds:  -metoprolol 25 bid -humalog tid/ lantus 10 hs -allopurinol 100/ lipitor 40/ colchicine biw/ MVI/ renvela ac/ sensipar 60 bid -ecasa 81/ plavix 75 -elavil 25 hs prn neuropath pain  CXR 12/12 - vasc congestion  Dialysis:[facility] MWF 4h 77mn 95.5kg 2/2 bath LUA AVG Hep none -venofer 50 q wk -calc 0.75 w HD    Impression: 1. CAD sp CABG: on 12/1. 2. AMS/ TME - slowly improving,  off pressors 3. ESRD HD MWF 4. Volume - up 7kg, resp stable 5. Anemia of CKD: Hb 9 -11 range, no esa for now 6. DM , mult complications 7. Hx prior CVA's   Plan - HD Friday, max UF as tRonnie DerbyMD CNewell Rubbermaidpager [phone]  09/26/2017, 10:30 AM   Recent Labs  Lab 09/19/17 1945 09/20/17 0400 09/20/17 1628  09/23/17 0226 09/25/17 00347  09/26/17 0357  NA  --  134*  --    < > 134* 130* 133*  K  --  4.0  --    < > 3.6 3.6 3.8  CL  --  93*  --    < > 92* 90* 93*  CO2  --  23  --    < > _0 GLUCOSE  --  165*  --    < > 142* 209* 232*  BUN  --  20  --    < > 57* 62* 40*  CREATININE  --  5.19*  --    < > 7.65* 6.52* 4.28*  CALCIUM  --  7.7*  --    < > 8.1* 8.3* 8.8*  PHOS 3.6 4.1 2.6  --   --   --   --    < > = values in this interval not displayed.   Recent Labs  Lab 09/21/17 0320 09/23/17 0226  AST 25 19  ALT 20 17  ALKPHOS 77 82  BILITOT 0.9 0.3  PROT 5.2* 5.5*  ALBUMIN 2.2* 2.2*   Recent Labs  Lab 09/24/17 0258 09/25/17 0233 09/26/17 0357  WBC 25.8* 23.5* 22.9*  HGB 10.2* 9.7* 9.6*  HCT 28.7* 27.6* 27.0*  MCV 82.0 80.5 80.1  PLT 387 406* 426*   Iron/TIBC/Ferritin/ %Sat No results found for: IRON, TIBC, FERRITIN, IRONPCTSAT

## 2017-09-26 NOTE — Care Management Note (Signed)
Case Management Note Marvetta Gibbons RN, BSN Unit 4E-Case Manager-- [place] coverage [phone]  Patient Details  Name: Ernest [name] MRN: [id number] Date of Birth: 18-Jul-1946  Subjective/Objective:    Pt admitted with NSTEMI- s/p cath with CAD found- needs CABG- pt and family thinking on surgery - per CVTS can be done on 11/30 if they decide to move forward after plavix wash out               Action/Plan: PTA pt lived at home wife- uses cane- ambulatory- hx ESRD on HD- CM to follow for d/c needs pending surgery   Expected Discharge Date:                 Expected Discharge Plan:  Skilled Nursing Facility  In-House Referral:  Clinical Social Work  Discharge planning Services  CM Consult  Post Acute Care Choice:    Choice offered to:     DME Arranged:    DME Agency:     HH Arranged:    [place] Agency:     Status of Service:  In process, will continue to follow  If discussed at Long Length of Stay Meetings, dates discussed:  12/13  Discharge Disposition:   Additional Comments:  09/26/17- 1400- Marvetta Gibbons RN, CM- CM continues to follow for transition of care- pt will most likely need SNF- will need to consult CSW-  Per QC/LLOS mts- medical director has placed pt with [facility] program under [facility]- for pass through after SNF- will follow once out of ICU for where pt is going at time of discharge.   Dawayne Patricia, RN 09/26/2017, 2:12 PM

## 2017-09-26 NOTE — NC FL2 (Signed)
[place] MEDICAID FL2 LEVEL OF CARE SCREENING TOOL     IDENTIFICATION  Patient Name: Ernest [name] Birthdate: 02/08/1946 Sex: male Admission Date (Current Location): 09/07/2017  County and Medicaid Number:  [place]   Facility and Address:  [facility], [address], [place], [place] [address]      Provider Number: [id number]  Attending Physician Name and Address:  Hendrickson, Steven C, MD  Relative Name and Phone Number:       Current Level of Care: Hospital Recommended Level of Care: Skilled Nursing Facility Prior Approval Number:    Date Approved/Denied:   PASRR Number: [id number]  Discharge Plan: SNF    Current Diagnoses: Patient Active Problem List   Diagnosis Date Noted  . Cerebral embolism with cerebral infarction 09/20/2017  . NSVT (nonsustained ventricular tachycardia) (HCC)   . Elevated troponin   . Dyspnea 09/08/2017  . Non-ST elevation MI (NSTEMI) (HCC)   . Acute on chronic combined systolic and diastolic heart failure (HCC)   . End-stage renal disease on hemodialysis (HCC)   . Left leg weakness   . TIA (transient ischemic attack) 10/27/2016  . History of CVA with residual deficit 08/07/2015  . Seizure, late effect of stroke (HCC) 08/07/2015  . Diabetic peripheral neuropathy (HCC) 08/07/2015  . Obstructive sleep apnea 06/16/2013  . Hyperlipidemia associated with type 2 diabetes mellitus (HCC)   . ESRD (end stage renal disease) (HCC)   . Erectile dysfunction associated with type 2 diabetes mellitus (HCC)   . Hypertension due to end stage renal disease caused by type 2 diabetes mellitus, on dialysis (HCC)   . COPD (chronic obstructive pulmonary disease) (HCC)   . Coronary artery disease   . Chronic combined systolic and diastolic CHF (congestive heart failure) (HCC)   . Gout, unspecified 02/28/2009  . Uncontrolled type 2 diabetes mellitus with end-stage renal disease (HCC) 06/08/1984    Orientation RESPIRATION BLADDER  Height & Weight     Self, Place, Situation  Normal Continent Weight: 225 lb 1.4 oz (102.1 kg) Height:  5' 9" (175.3 cm)  BEHAVIORAL SYMPTOMS/MOOD NEUROLOGICAL BOWEL NUTRITION STATUS      Continent Diet(see DC summary)  AMBULATORY STATUS COMMUNICATION OF NEEDS Skin   Extensive Assist Verbally Surgical wounds(sternum)                       Personal Care Assistance Level of Assistance  Bathing, Dressing Bathing Assistance: Maximum assistance   Dressing Assistance: Maximum assistance     Functional Limitations Info             SPECIAL CARE FACTORS FREQUENCY  PT (By licensed PT), OT (By licensed OT)     PT Frequency: 5/wk OT Frequency: 5/wk            Contractures      Additional Factors Info  Code Status, Allergies, Insulin Sliding Scale Code Status Info: FULL Allergies Info: NKA   Insulin Sliding Scale Info: 8/day       Current Medications (09/26/2017):  This is the current hospital active medication list Current Facility-Administered Medications  Medication Dose Route Frequency Provider Last Rate Last Dose  . 0.9 %  sodium chloride infusion  100 mL Intravenous PRN Schertz, Robert, MD      . 0.9 %  sodium chloride infusion  100 mL Intravenous PRN Schertz, Robert, MD      . 0.9 %  sodium chloride infusion  100 mL Intravenous PRN Schertz, Robert, MD      .   0.9 %  sodium chloride infusion  100 mL Intravenous PRN Schertz, Robert, MD      . amiodarone (PACERONE) tablet 200 mg  200 mg Per NG tube BID Bartle, Bryan K, MD   200 mg at 09/26/17 0918  . amitriptyline (ELAVIL) tablet 25 mg  25 mg Oral QHS Hendrickson, Steven C, MD   25 mg at 09/25/17 2205  . aspirin chewable tablet 81 mg  81 mg Oral Daily Hendrickson, Steven C, MD   81 mg at 09/26/17 0918  . atorvastatin (LIPITOR) tablet 80 mg  80 mg Oral q1800 Kelly, Thomas A, MD   80 mg at 09/25/17 1736  . bisacodyl (DULCOLAX) EC tablet 10 mg  10 mg Oral Daily Zimmerman, Donielle M, [name]   10 mg at 09/21/17 0942   Or   . bisacodyl (DULCOLAX) suppository 10 mg  10 mg Rectal Daily Zimmerman, Donielle M, [name]      . chlorhexidine gluconate (MEDLINE KIT) (PERIDEX) 0.12 % solution 15 mL  15 mL Mouth Rinse BID Hendrickson, Steven C, MD   15 mL at 09/26/17 0821  . cinacalcet (SENSIPAR) tablet 120 mg  120 mg Oral Q supper Dunham, Cynthia, MD   120 mg at 09/25/17 1735  . clopidogrel (PLAVIX) tablet 75 mg  75 mg Oral Daily Hendrickson, Steven C, MD   75 mg at 09/26/17 0918  . docusate sodium (COLACE) capsule 200 mg  200 mg Oral Daily Zimmerman, Donielle M, [name]   200 mg at 09/20/17 1044  . doxercalciferol (HECTOROL) injection 2 mcg  2 mcg Intravenous Q M,W,F-HD Upton, Elizabeth, MD   2 mcg at 09/25/17 1139  . feeding supplement (NEPRO CARB STEADY) liquid 1,000 mL  1,000 mL Per Tube Continuous Hendrickson, Steven C, MD 50 mL/hr at 09/26/17 1400 1,000 mL at 09/26/17 1400  . feeding supplement (PRO-STAT SUGAR FREE 64) liquid 30 mL  30 mL Per Tube BID Hendrickson, Steven C, MD   30 mL at 09/26/17 0917  . heparin ADULT infusion 100 units/mL (25000 units/250mL sodium chloride 0.45%)  1,750 Units/hr Intravenous Continuous Wilson, Frank R, RPH 17.5 mL/hr at 09/26/17 1400 1,750 Units/hr at 09/26/17 1400  . Influenza vac split quadrivalent PF (FLUZONE HIGH-DOSE) injection 0.5 mL  0.5 mL Intramuscular Tomorrow-1000 Hendrickson, Steven C, MD      . insulin aspart (novoLOG) injection 0-20 Units  0-20 Units Subcutaneous Q4H Bartle, Bryan K, MD   7 Units at 09/26/17 1135  . insulin detemir (LEVEMIR) injection 20 Units  20 Units Subcutaneous BID Hendrickson, Steven C, MD   20 Units at 09/26/17 0922  . lactated ringers infusion   Intravenous Continuous Zimmerman, Donielle M, [name] 20 mL/hr at 09/13/17 1600    . levETIRAcetam (KEPPRA) 100 MG/ML solution 1,000 mg  1,000 mg Per Tube Daily Hendrickson, Steven C, MD   1,000 mg at 09/26/17 0922  . levETIRAcetam (KEPPRA) 100 MG/ML solution 500 mg  500 mg Per Tube Q M,W,F-HD Hendrickson, Steven C, MD    500 mg at 09/25/17 1738  . magnesium sulfate IVPB 4 g 100 mL  4 g Intravenous Once Zimmerman, Donielle M, [name]      . MEDLINE mouth rinse  15 mL Mouth Rinse QID Hendrickson, Steven C, MD   15 mL at 09/26/17 1613  . midodrine (PROAMATINE) tablet 5 mg  5 mg Oral TID WC Hendrickson, Steven C, MD   5 mg at 09/26/17 1625  . multivitamin (RENA-VIT) tablet 1 tablet  1 tablet Oral QHS Narendra, Nischal,   MD   1 tablet at 09/25/17 2203  . norepinephrine (LEVOPHED) 16 mg in dextrose 5 % 250 mL (0.064 mg/mL) infusion  0-20 mcg/min Intravenous Titrated Melrose Nakayama, MD   Stopped at 09/25/17 [phone]  . ondansetron (ZOFRAN) injection 4 mg  4 mg Intravenous Q6H PRN Lars Pinks M, [name]      . pantoprazole sodium (PROTONIX) 40 mg/20 mL oral suspension 40 mg  40 mg Per Tube Daily Melrose Nakayama, MD   40 mg at 09/26/17 0917  . sevelamer carbonate (RENVELA) tablet 2,400 mg  2,400 mg Oral TID WC Rice, Resa Miner, MD   2,400 mg at 09/26/17 1135  . warfarin (COUMADIN) tablet 2.5 mg  2.5 mg Oral q1800 Melrose Nakayama, MD   2.5 mg at 09/25/17 1736  . Warfarin - Physician Dosing Inpatient   Does not apply q1800 Melrose Nakayama, MD         Discharge Medications: Please see discharge summary for a list of discharge medications.  Relevant Imaging Results:  Relevant Lab Results:   Additional Information SS#: [id number]; dialysis MWF at [facility], [place], [place]

## 2017-09-26 NOTE — Progress Notes (Signed)
Occupational Therapy Treatment Patient Details Name: Ernest [name] MRN: [id number] DOB: 12/28/1945 Today's Date: 09/26/2017    History of present illness Pt adm 11/24 with NSTEMI and underwent CABG x 4 and MVR on 08/18/17. Post op lethargy and confusion. PMH - Multiple CVA's, DM, HTN, chf, ESRD on HD, cad, copd   OT comments  Pt demonstrates nice improvement today.  He was able to scoot to recliner with max A, and pt actively helping.  He stood with mod A +2 for 2.5 mins.  More alert today.    Follow Up Recommendations  SNF    Equipment Recommendations  3 in 1 bedside commode    Recommendations for Other Services      Precautions / Restrictions Precautions Precautions: Sternal;Fall       Mobility Bed Mobility Overal bed mobility: Needs Assistance Bed Mobility: Supine to Sit     Supine to sit: Max assist     General bed mobility comments: assist and cues to move LEs off bed and to lift trunk from bed   Transfers Overall transfer level: Needs assistance Equipment used: None(EVA walker ) Transfers: Sit to/from Stand;Lateral/Scoot Transfers Sit to Stand: Mod assist;+2 physical assistance        Lateral/Scoot Transfers: Max assist General transfer comment: Pt performed lateral scoot to chair with max A, and required assist to power up into standing using EVA walker     Balance Overall balance assessment: Needs assistance Sitting-balance support: Feet supported Sitting balance-Leahy Scale: Poor Sitting balance - Comments: Pt able to sit EOB with min guard assist for 1-2 minutes at a time before fatiguing and leaning to the left, he progresses to mod A at times  Postural control: Left lateral lean Standing balance support: Bilateral upper extremity supported Standing balance-Leahy Scale: Poor Standing balance comment: bil UE assist and min A +2 - fatigues rapidly and only able to maintain standing for 2.5 mins                            ADL either  performed or assessed with clinical judgement   ADL       Grooming: Wash/dry face;Wash/dry hands;Moderate assistance;Sitting                                       Vision       Perception     Praxis      Cognition Arousal/Alertness: Lethargic;Awake/alert Behavior During Therapy: Flat affect Overall Cognitive Status: Impaired/Different from baseline Area of Impairment: Attention;Following commands;Problem solving;Safety/judgement                   Current Attention Level: Sustained;Focused   Following Commands: Follows one step commands consistently;Follows one step commands inconsistently     Problem Solving: Slow processing;Decreased initiation;Difficulty sequencing;Requires verbal cues;Requires tactile cues General Comments: Pt oriented to place, and situation today, not to time.  He is able to make eye contact today         Exercises     Shoulder Instructions       General Comments VSS     Pertinent Vitals/ Pain       Pain Assessment: No/denies pain  Home Living  Prior Functioning/Environment              Frequency  Min 2X/week        Progress Toward Goals  OT Goals(current goals can now be found in the care plan section)  Progress towards OT goals: Progressing toward goals     Plan Discharge plan remains appropriate    Co-evaluation                 AM-PAC PT "6 Clicks" Daily Activity     Outcome Measure   Help from another person eating meals?: Total Help from another person taking care of personal grooming?: A Lot Help from another person toileting, which includes using toliet, bedpan, or urinal?: Total Help from another person bathing (including washing, rinsing, drying)?: Total Help from another person to put on and taking off regular upper body clothing?: Total Help from another person to put on and taking off regular lower body clothing?:  Total 6 Click Score: 7    End of Session Equipment Utilized During Treatment: Other (comment)(EVA walker )  OT Visit Diagnosis: Muscle weakness (generalized) (M62.81);Cognitive communication deficit (R41.841)   Activity Tolerance Patient limited by fatigue   Patient Left in chair;with call bell/phone within reach;with chair alarm set   Nurse Communication Mobility status        Time: 1426-1500 OT Time Calculation (min): 34 min  Charges: OT General Charges $OT Visit: 1 Visit OT Treatments $Therapeutic Activity: 23-37 mins  [facility], OTR/L [phone]    Lucille Passy M 09/26/2017, 3:31 PM

## 2017-09-26 NOTE — Progress Notes (Signed)
TCTS BRIEF SICU PROGRESS NOTE  13 Days Post-Op  S/P Procedure(s) (LRB): CORONARY ARTERY BYPASS GRAFTING (CABG) times four LIMA  to LAD, SVG sequentially to OM and RAMUS Intermediate and SVG to Acute Marginal (N/A) TRANSESOPHAGEAL ECHOCARDIOGRAM (TEE) (N/A) MITRAL VALVE REPAIR (MVR) (N/A)   Stable day  Plan: Continue current plan  Rexene Alberts, MD 09/26/2017 8:00 PM

## 2017-09-26 NOTE — Evaluation (Signed)
Clinical/Bedside Swallow Evaluation Patient Details  Name: Ernest [name] MRN: [id number] Date of Birth: [birth year]  Today's Date: 09/26/2017 Time: SLP Start Time (ACUTE ONLY): 0750 SLP Stop Time (ACUTE ONLY): 0813 SLP Time Calculation (min) (ACUTE ONLY): 23 min  Past Medical History:  Past Medical History:  Diagnosis Date  . Abnormal stress test    s/p cath November 2013 with modest disease involving the ostial left main, proximal LAD, proximal RCA - do not appear to be hemodynamically signficant; mild LV dysfunction  . Bacteremia   . Chronic systolic CHF (congestive heart failure) ([place])   . COPD (chronic obstructive pulmonary disease) ([place])   . Coronary artery disease    per cath report 2013  . Ejection fraction < 50%   . Erectile dysfunction    penile implant  . ESRD (end stage renal disease) ([place])    Started HD in [place] in 2009, ESRD was due to DM. Moved to [facility] in Dec 2009 and now gets dialysis at [facility] on a MWF schedule.     . Gout   . Gout    Hx: of  . Hepatitis C   . Hyperlipidemia   . Hypertension   . Obesity   . Retinopathy   . Shortness of breath    Hx: of with exertion  . Stroke ([place])   . Type II or unspecified type diabetes mellitus without mention of complication, not stated as uncontrolled    adult onset   Past Surgical History:  Past Surgical History:  Procedure Laterality Date  . AV FISTULA PLACEMENT Left 01/13/2013   Procedure: INSERTION OF ARTERIOVENOUS (AV) GORE-TEX GRAFT ARM;  Surgeon: Angelia Mould, MD;  Location: [place];  Service: Vascular;  Laterality: Left;  . AV FISTULA PLACEMENT Left 05/05/2013   Procedure: INSERTION OF LEFT UPPER ARM  ARTERIOVENOUS GORTEX GRAFT;  Surgeon: Angelia Mould, MD;  Location: [place];  Service: Vascular;  Laterality: Left;  . [place] REMOVAL Left 03/26/2013   Procedure: REMOVAL OF  NON INCORPORATED ARTERIOVENOUS GORETEX GRAFT ([place]) left arm * repair of  left brachial artery with vein patch angioplasty.;   Surgeon: Angelia Mould, MD;  Location: [place];  Service: Vascular;  Laterality: Left;  . CARDIAC CATHETERIZATION  August 26, 2012  . CATARACT EXTRACTION W/ INTRAOCULAR LENS  IMPLANT, BILATERAL    . COLONOSCOPY    . CORONARY ARTERY BYPASS GRAFT N/A 09/13/2017   Procedure: CORONARY ARTERY BYPASS GRAFTING (CABG) times four LIMA  to LAD, SVG sequentially to OM and RAMUS Intermediate and SVG to Acute Marginal;  Surgeon: Melrose Nakayama, MD;  Location: [place];  Service: Open Heart Surgery;  Laterality: N/A;  . EMBOLECTOMY Right 08/27/2013   Procedure: EMBOLECTOMY;  Surgeon: Mal Misty, MD;  Location: [place];  Service: Vascular;  Laterality: Right;  Thrombectomy of Radial and ulnar artery.  [name] EYE SURGERY     laser.  and surgery for DM  . fistula     RUE and wrist  . INSERTION OF DIALYSIS CATHETER Right 01/01/2013   Procedure: INSERTION OF DIALYSIS CATHETER right  internal jugular;  Surgeon: Serafina Mitchell, MD;  Location: [place];  Service: Vascular;  Laterality: Right;  . INSERTION OF DIALYSIS CATHETER N/A 03/26/2013   Procedure: INSERTION OF DIALYSIS CATHETER Left internal jugular vein;  Surgeon: Angelia Mould, MD;  Location: [place];  Service: Vascular;  Laterality: N/A;  . LEFT HEART CATH AND CORONARY ANGIOGRAPHY N/A 09/09/2017   Procedure: LEFT HEART  CATH AND CORONARY ANGIOGRAPHY;  Surgeon: Troy Sine, MD;  Location: [place] CV LAB;  Service: Cardiovascular;  Laterality: N/A;  . LIGATION OF ARTERIOVENOUS  FISTULA Right 12/30/2013   Procedure: REMOVAL OF SEGMENT OF GORTEX GRAFT AND FISTULA  AND REPAIR OF BRACHIAL ARTERY;  Surgeon: Mal Misty, MD;  Location: [place];  Service: Vascular;  Laterality: Right;  . MITRAL VALVE REPAIR N/A 09/13/2017   Procedure: MITRAL VALVE REPAIR (MVR);  Surgeon: Melrose Nakayama, MD;  Location: [place];  Service: Open Heart Surgery;  Laterality: N/A;  . PENILE PROSTHESIS IMPLANT     1997.. no card  . TEE WITHOUT CARDIOVERSION N/A  06/11/2013   Procedure: TRANSESOPHAGEAL ECHOCARDIOGRAM (TEE);  Surgeon: Larey Dresser, MD;  Location: [facility] ENDOSCOPY;  Service: Cardiovascular;  Laterality: N/A;  . TEE WITHOUT CARDIOVERSION N/A 09/13/2017   Procedure: TRANSESOPHAGEAL ECHOCARDIOGRAM (TEE);  Surgeon: Melrose Nakayama, MD;  Location: [place];  Service: Open Heart Surgery;  Laterality: N/A;  . THROMBECTOMY W/ EMBOLECTOMY Left 03/26/2013   Procedure: Attempted thrombectomy of left arm arteriovenous goretex graft.;  Surgeon: Angelia Mould, MD;  Location: [place];  Service: Vascular;  Laterality: Left;  [name] VENOGRAM N/A 04/07/2013   Procedure: VENOGRAM;  Surgeon: Serafina Mitchell, MD;  Location: [facility] CATH LAB;  Service: Cardiovascular;  Laterality: N/A;   HPI:  Pt adm 11/24 with NSTEMI and underwent CABG x 4 and MVR on 08/18/17. Intubated 11/30-12/1. Post op lethargy and confusion, stroke suspected but CT found No acute intracranial process, stable mild-to-moderate chronic small vessel ischemic disease and old small cerebellar infarcts. CXR 12/12 No acute intracranial process, stable mild parenchymal brain volume loss, stable mild-to-moderate chronic small vessel ischemic disease and old small cerebellar infarcts. BSE 06/2015 functional swallow, regular/thin recommended. PMH - Multiple CVA's, DM, HTN, chf, ESRD on HD, cad, copd.   Assessment / Plan / Recommendation Clinical Impression  Pt is somewhat drowsy, following directions with low vocal intensity and mostly unintelligible at phrase/sentence level. Exhibits a weak volitional cough and a baseline dry cough noted prior to food/liquid. [place] oral weakness and required hand over hand feeding assist due to BLE weakness. Swallow appeared timely and coordinated with respirations. No cough, wet vocal quality or throat clearing noted during po trials but dry cough present at end of assessment. Pt demonstrated a mastication pattern with puree texture. He has no upper dentition and does not  usually eat with his dentures per pt. Recommend he inititate a Dys 1 (puree) texture, thin liquids, small straw sips allowed, pills crushed and full supervision during meals SLP Visit Diagnosis: Dysphagia, oral phase (R13.11)    Aspiration Risk  Mild aspiration risk;Moderate aspiration risk    Diet Recommendation Dysphagia 1 (Puree);Thin liquid   Liquid Administration via: Straw;Cup Medication Administration: Crushed with puree Supervision: Full supervision/cueing for compensatory strategies;Staff to assist with self feeding Compensations: Slow rate;Small sips/bites;Minimize environmental distractions Postural Changes: Seated upright at 90 degrees    Other  Recommendations Oral Care Recommendations: Oral care BID   Follow up Recommendations Skilled Nursing facility      Frequency and Duration min 2x/week  2 weeks       Prognosis Prognosis for Safe Diet Advancement: Good      Swallow Study   General HPI: Pt adm 11/24 with NSTEMI and underwent CABG x 4 and MVR on 08/18/17. Intubated 11/30-12/1. Post op lethargy and confusion, stroke suspected but CT found No acute intracranial process, stable mild-to-moderate chronic small vessel ischemic disease and  old small cerebellar infarcts. CXR 12/12 No acute intracranial process, stable mild parenchymal brain volume loss, stable mild-to-moderate chronic small vessel ischemic disease and old small cerebellar infarcts. BSE 06/2015 functional swallow, regular/thin recommended. PMH - Multiple CVA's, DM, HTN, chf, ESRD on HD, cad, copd. Type of Study: Bedside Swallow Evaluation Previous Swallow Assessment: see HPI Diet Prior to this Study: NPO;NG Tube Temperature Spikes Noted: No Respiratory Status: Room air History of Recent Intubation: Yes Length of Intubations (days): 2 days Date extubated: 09/14/17 Behavior/Cognition: Cooperative;Pleasant mood;Requires cueing;Lethargic/Drowsy Oral Cavity Assessment: Dry Oral Care Completed by SLP:  Yes Oral Cavity - Dentition: (no upper, several lower anterior) Vision: (?) Self-Feeding Abilities: Total assist Patient Positioning: Upright in bed(bed in chair position) Baseline Vocal Quality: Low vocal intensity Volitional Cough: Weak Volitional Swallow: Able to elicit    Oral/Motor/Sensory Function Overall Oral Motor/Sensory Function: Generalized oral weakness   Ice Chips Ice chips: Impaired Oral Phase Impairments: Reduced lingual movement/coordination Pharyngeal Phase Impairments: (none)   Thin Liquid Thin Liquid: Impaired Presentation: Cup;Straw Oral Phase Impairments: Reduced labial seal Oral Phase Functional Implications: Right anterior spillage Pharyngeal  Phase Impairments: (none)    Nectar Thick Nectar Thick Liquid: Not tested   Honey Thick Honey Thick Liquid: Not tested   Puree Puree: Impaired Presentation: Spoon Oral Phase Impairments: Reduced lingual movement/coordination Pharyngeal Phase Impairments: (none)   Solid   GO   Solid: Not tested        Ernest [name] 09/26/2017,8:53 AM  Ernest [name].Ed [employer] [phone]

## 2017-09-26 NOTE — Progress Notes (Signed)
ANTICOAGULATION CONSULT NOTE  Pharmacy Consult for Heparin  Indication: atrial fibrillation  [name]  Patient Measurements: Height: 5\' 9"  (175.3 cm) Weight: 225 lb 1.4 oz (102.1 kg) IBW/kg (Calculated) : 70.7 Heparin Dosing Weight: 92kg  Vital Signs: Temp: 97.9 F (36.6 C) (12/13 0725) Temp Source: Oral (12/13 0725) BP: 172/75 (12/13 0700) Pulse Rate: 79 (12/13 0700)  Labs: Recent Labs    09/24/17 0258 09/24/17 1427 09/25/17 0233 09/26/17 0357  HGB 10.2*  --  9.7* 9.6*  HCT 28.7*  --  27.6* 27.0*  PLT 387  --  406* 426*  LABPROT 16.2*  --   --  15.8*  INR 1.31  --   --  1.27  HEPARINUNFRC 0.23* 0.19* 0.54 0.60  CREATININE  --   --  6.52* 4.28*    Estimated Creatinine Clearance: 19.2 mL/min (A) (by C-G formula based on SCr of 4.28 mg/dL (H)).  Assessment: 71 yo male s/p CABG and MV repair on 11/30 with Afib. Patient continues on heparin with warfarin started yesterday.  Heparin level continues to be at goal this morning, INR is relatively unchanged at 1.2. No bleeding issues noted, wbc normal.  Goal of Therapy:  Heparin level 0.3-0.7 units/ml Monitor platelets by anticoagulation protocol: Yes   Plan:  Continue Heparin at current rate  Warfarin dosing per MD  Erin Hearing PharmD., BCPS Clinical Pharmacist Pager [phone] 09/26/2017 10:00 AM

## 2017-09-26 NOTE — Progress Notes (Addendum)
Physical Therapy Treatment Patient Details Name: Ernest [name] MRN: [id number] DOB: [birth year] Today's Date: 09/26/2017    History of Present Illness Pt adm 11/24 with NSTEMI and underwent CABG x 4 and MVR on 08/18/17. Post op lethargy and confusion. PMH - Multiple CVA's, DM, HTN, chf, ESRD on HD, cad, copd    PT Comments    Pt with little endurance, but worked hard throughout PT/OT session.  Emphasis on sit to stand and standing/w/shifting in the EVA walker.   Follow Up Recommendations  Supervision/Assistance - 24 hour;SNF     Equipment Recommendations  Other (comment)    Recommendations for Other Services       Precautions / Restrictions Precautions Precautions: Sternal;Fall    Mobility  Bed Mobility Overal bed mobility: Needs Assistance Bed Mobility: Supine to Sit     Supine to sit: Max assist     General bed mobility comments: assist and cues to move LEs off bed and to lift trunk from bed   Transfers Overall transfer level: Needs assistance Equipment used: None(EVA walker ) Transfers: Sit to/from Stand;Lateral/Scoot Transfers Sit to Stand: Mod assist;+2 physical assistance        Lateral/Scoot Transfers: Max assist General transfer comment: Pt performed lateral scoot to chair with max A, and required assist to power up into standing using EVA walker   Ambulation/Gait             General Gait Details: not attempted today   Stairs            Wheelchair Mobility    Modified Rankin (Stroke Patients Only)       Balance Overall balance assessment: Needs assistance Sitting-balance support: Feet supported Sitting balance-Leahy Scale: Poor Sitting balance - Comments: Pt able to sit EOB with min guard assist for 1-2 minutes at a time before fatiguing and leaning to the left, he progresses to mod A at times  Postural control: Left lateral lean Standing balance support: Bilateral upper extremity supported Standing balance-Leahy Scale:  Poor Standing balance comment: bil UE assist and min A +2 - fatigues rapidly and only able to maintain standing for 2.5 mins with a definite list posteriorly                            Cognition Arousal/Alertness: Lethargic;Awake/alert Behavior During Therapy: Flat affect Overall Cognitive Status: Impaired/Different from baseline Area of Impairment: Attention;Following commands;Problem solving;Safety/judgement                   Current Attention Level: Sustained;Focused   Following Commands: Follows one step commands consistently;Follows one step commands inconsistently     Problem Solving: Slow processing;Decreased initiation;Difficulty sequencing;Requires verbal cues;Requires tactile cues General Comments: Pt oriented to place, and situation today, not to time.  He is able to make eye contact today       Exercises      General Comments General comments (skin integrity, edema, etc.): vss through out standing trial      Pertinent Vitals/Pain Pain Assessment: No/denies pain    Home Living                      Prior Function            PT Goals (current goals can now be found in the care plan section) Acute Rehab PT Goals PT Goal Formulation: With patient Time For Goal Achievement: 10/01/17 Potential to Achieve Goals: Fair Progress towards PT goals:  Progressing toward goals    Frequency    Min 3X/week      PT Plan Current plan remains appropriate    Co-evaluation              AM-PAC PT "6 Clicks" Daily Activity  Outcome Measure  Difficulty turning over in bed (including adjusting bedclothes, sheets and blankets)?: Unable Difficulty moving from lying on back to sitting on the side of the bed? : Unable Difficulty sitting down on and standing up from a chair with arms (e.g., wheelchair, bedside commode, etc,.)?: Unable Help needed moving to and from a bed to chair (including a wheelchair)?: A Lot Help needed walking in hospital  room?: A Lot Help needed climbing 3-5 steps with a railing? : Total 6 Click Score: 8    End of Session   Activity Tolerance: Patient tolerated treatment well Patient left: in chair;with call bell/phone within reach;with chair alarm set Nurse Communication: Mobility status PT Visit Diagnosis: Other abnormalities of gait and mobility (R26.89);Muscle weakness (generalized) (M62.81);Difficulty in walking, not elsewhere classified (R26.2)     Time: 1448-1500 PT Time Calculation (min) (ACUTE ONLY): 12 min  Charges:  $Therapeutic Activity: 8-22 mins                    G Codes:       2017-10-14  Ernest [name], PT [phone]  (pager)   Ernest [name] 10-14-17, 3:49 PM

## 2017-09-27 LAB — CBC
HEMATOCRIT: 27.1 % — AB (ref 39.0–52.0)
HEMOGLOBIN: 9.3 g/dL — AB (ref 13.0–17.0)
MCH: 27.8 pg (ref 26.0–34.0)
MCHC: 34.3 g/dL (ref 30.0–36.0)
MCV: 80.9 fL (ref 78.0–100.0)
Platelets: 413 10*3/uL — ABNORMAL HIGH (ref 150–400)
RBC: 3.35 MIL/uL — AB (ref 4.22–5.81)
RDW: 15.8 % — ABNORMAL HIGH (ref 11.5–15.5)
WBC: 21.4 10*3/uL — ABNORMAL HIGH (ref 4.0–10.5)

## 2017-09-27 LAB — GLUCOSE, CAPILLARY
GLUCOSE-CAPILLARY: 174 mg/dL — AB (ref 65–99)
GLUCOSE-CAPILLARY: 200 mg/dL — AB (ref 65–99)
Glucose-Capillary: 147 mg/dL — ABNORMAL HIGH (ref 65–99)
Glucose-Capillary: 91 mg/dL (ref 65–99)
Glucose-Capillary: 168 mg/dL — ABNORMAL HIGH (ref 65–99)

## 2017-09-27 LAB — BASIC METABOLIC PANEL
ANION GAP: 12 (ref 5–15)
BUN: 61 mg/dL — ABNORMAL HIGH (ref 6–20)
CALCIUM: 9 mg/dL (ref 8.9–10.3)
CO2: 27 mmol/L (ref 22–32)
CREATININE: 5.75 mg/dL — AB (ref 0.61–1.24)
Chloride: 93 mmol/L — ABNORMAL LOW (ref 101–111)
GFR, EST AFRICAN AMERICAN: 10 mL/min — AB (ref >60)
GFR, EST NON AFRICAN AMERICAN: 9 mL/min — AB (ref >60)
Glucose, Bld: 193 mg/dL — ABNORMAL HIGH (ref 65–99)
Potassium: 3.8 mmol/L (ref 3.5–5.1)
SODIUM: 132 mmol/L — AB (ref 135–145)

## 2017-09-27 LAB — HEPARIN LEVEL (UNFRACTIONATED): HEPARIN UNFRACTIONATED: 0.55 [IU]/mL (ref 0.30–0.70)

## 2017-09-27 LAB — PROTIME-INR
INR: 1.34
PROTHROMBIN TIME: 16.4 s — AB (ref 11.4–15.2)

## 2017-09-27 MED ORDER — METOPROLOL TARTRATE 12.5 MG HALF TABLET
12.5000 mg | ORAL_TABLET | Freq: Two times a day (BID) | ORAL | Status: DC
  Administered 2017-09-28 – 2017-09-29 (×4): 12.5 mg via ORAL
  Filled 2017-09-27 (×5): qty 1

## 2017-09-27 MED ORDER — PENTAFLUOROPROP-TETRAFLUOROETH EX AERO: 1.0000 "application " | INHALATION_SPRAY | CUTANEOUS | Status: DC | PRN

## 2017-09-27 MED ORDER — HEPARIN SODIUM (PORCINE) 1000 UNIT/ML DIALYSIS: 1000.0000 [IU] | INTRAMUSCULAR | Status: DC | PRN

## 2017-09-27 MED ORDER — ALTEPLASE 2 MG IJ SOLR: 2.0000 mg | Freq: Once | INTRAMUSCULAR | Status: DC | PRN

## 2017-09-27 MED ORDER — SODIUM CHLORIDE 0.9 % IV SOLN: 100.0000 mL | INTRAVENOUS | Status: DC | PRN

## 2017-09-27 MED ORDER — INSULIN ASPART 100 UNIT/ML ~~LOC~~ SOLN
0.0000 [IU] | Freq: Three times a day (TID) | SUBCUTANEOUS | Status: DC
  Administered 2017-09-28: 3 [IU] via SUBCUTANEOUS
  Administered 2017-09-28: 2 [IU] via SUBCUTANEOUS
  Administered 2017-09-28: 7 [IU] via SUBCUTANEOUS
  Administered 2017-09-29: 3 [IU] via SUBCUTANEOUS
  Administered 2017-09-29: 7 [IU] via SUBCUTANEOUS
  Administered 2017-09-29: 11 [IU] via SUBCUTANEOUS

## 2017-09-27 MED ORDER — LIDOCAINE HCL (PF) 1 % IJ SOLN: 5.0000 mL | INTRAMUSCULAR | Status: DC | PRN

## 2017-09-27 MED ORDER — LIDOCAINE-PRILOCAINE 2.5-2.5 % EX CREA: 1.0000 "application " | TOPICAL_CREAM | CUTANEOUS | Status: DC | PRN

## 2017-09-27 MED ORDER — INSULIN ASPART 100 UNIT/ML ~~LOC~~ SOLN
0.0000 [IU] | Freq: Every day | SUBCUTANEOUS | Status: DC
  Administered 2017-09-30: 2 [IU] via SUBCUTANEOUS

## 2017-09-27 MED ORDER — NEPRO/CARBSTEADY PO LIQD
1000.0000 mL | ORAL | Status: DC
  Administered 2017-09-27 – 2017-09-29 (×3): 1000 mL
  Filled 2017-09-27 (×5): qty 1000

## 2017-09-27 MED ORDER — WARFARIN SODIUM 5 MG PO TABS
5.0000 mg | ORAL_TABLET | Freq: Every day | ORAL | Status: DC
  Administered 2017-09-27 – 2017-09-30 (×4): 5 mg via ORAL
  Filled 2017-09-27 (×4): qty 1

## 2017-09-27 NOTE — Progress Notes (Signed)
  Speech Language Pathology Treatment: Dysphagia  Patient Details Name: Ernest [name] MRN: [id number] DOB: 1945/11/20 Today's Date: 09/27/2017 Time: 9201-0071 SLP Time Calculation (min) (ACUTE ONLY): 18 min  Assessment / Plan / Recommendation Clinical Impression  ST follow up for therapeutic diet tolerance.  Nursing reporting no obvious issues with intake but that the patient's appetite has been poor.  He currently still has DHT in place with nocturnal feeds.  Chart review indicated that the patient has been afebrile and lungs are been clear but diminished.  Meal observation was completed using thin liquids, pureed material and soft solids.  The patient demonstrated an oral and possible pharyngeal dysphagia.  Delayed oral transit was seen as well as decreased bolus awareness.  At times, the patient appeared to be holding material and required cue to swallow.  A mastication motion was seen given pureed material.  Cough x1 was seen given a small sip of thin liquids via straw sips but was not replicated once the patient was positioned better in bed.  Recommend continue with a dysphagia 1 diet with thin liquids.  The patient will require full assistance for all meals.  ST will continue to follow for therapeutic diet tolerance.     HPI HPI: Pt adm 11/24 with NSTEMI and underwent CABG x 4 and MVR on 08/18/17. Intubated 11/30-12/1. Post op lethargy and confusion, stroke suspected but CT found No acute intracranial process, stable mild-to-moderate chronic small vessel ischemic disease and old small cerebellar infarcts. CXR 12/12 No acute intracranial process, stable mild parenchymal brain volume loss, stable mild-to-moderate chronic small vessel ischemic disease and old small cerebellar infarcts. BSE 06/2015 functional swallow, regular/thin recommended. PMH - Multiple CVA's, DM, HTN, chf, ESRD on HD, cad, copd.      SLP Plan  Continue with current plan of care       Recommendations  Diet  recommendations: Dysphagia 1 (puree);Thin liquid Liquids provided via: Cup;Straw Medication Administration: Crushed with puree Supervision: Trained caregiver to feed patient Compensations: Slow rate;Small sips/bites;Minimize environmental distractions Postural Changes and/or Swallow Maneuvers: Seated upright 90 degrees;Upright 30-60 min after meal                Oral Care Recommendations: Oral care BID Follow up Recommendations: Skilled Nursing facility SLP Visit Diagnosis: Dysphagia, oral phase (R13.11) Plan: Continue with current plan of care       [place], [place], [place] Acute Rehab SLP [phone] [name] 09/27/2017, 11:15 AM

## 2017-09-27 NOTE — Progress Notes (Signed)
14 Days Post-Op Procedure(s) (LRB): CORONARY ARTERY BYPASS GRAFTING (CABG) times four LIMA  to LAD, SVG sequentially to OM and RAMUS Intermediate and SVG to Acute Marginal (N/A) TRANSESOPHAGEAL ECHOCARDIOGRAM (TEE) (N/A) MITRAL VALVE REPAIR (MVR) (N/A) Subjective: No complaints  Objective: Vital signs in last 24 hours: Temp:  [97.8 F (36.6 C)-98.5 F (36.9 C)] 98.5 F (36.9 C) (12/14 0742) Pulse Rate:  [81-103] 86 (12/14 0742) Cardiac Rhythm: Normal sinus rhythm (12/14 0000) Resp:  [12-25] 15 (12/14 0742) BP: (129-170)/(53-94) 154/82 (12/14 0742) SpO2:  [93 %-100 %] 94 % (12/14 0742) Weight:  [223 lb 15.8 oz (101.6 kg)] 223 lb 15.8 oz (101.6 kg) (12/14 0500)  Hemodynamic parameters for last 24 hours:    Intake/Output from previous day: 12/13 0701 - 12/14 0700 In: 1595 [P.O.:360; I.V.:385; NG/GT:850] Out: 180 [Emesis/NG output:180] Intake/Output this shift: No intake/output data recorded.  General appearance: cooperative and no distress Neurologic: unchanged Heart: regular rate and rhythm Lungs: diminished breath sounds bibasilar Abdomen: normal findings: soft, non-tender  Lab Results: Recent Labs    09/26/17 0357 09/27/17 0242  WBC 22.9* 21.4*  HGB 9.6* 9.3*  HCT 27.0* 27.1*  PLT 426* 413*   BMET:  Recent Labs    09/26/17 0357 09/27/17 0242  NA 133* 132*  K 3.8 3.8  CL 93* 93*  CO2 29 27  GLUCOSE 232* 193*  BUN 40* 61*  CREATININE 4.28* 5.75*  CALCIUM 8.8* 9.0    PT/INR:  Recent Labs    09/27/17 0242  LABPROT 16.4*  INR 1.34   ABG    Component Value Date/Time   PHART 7.427 09/15/2017 0320   HCO3 24.3 09/15/2017 0320   TCO2 26 09/14/2017 1645   ACIDBASEDEF 4.0 (H) 09/14/2017 0153   O2SAT 75.0 09/20/2017 0805   CBG (last 3)  Recent Labs    09/26/17 1947 09/27/17 0040 09/27/17 0409  GLUCAP 162* 200* 174*    Assessment/Plan: S/P Procedure(s) (LRB): CORONARY ARTERY BYPASS GRAFTING (CABG) times four LIMA  to LAD, SVG sequentially to OM  and RAMUS Intermediate and SVG to Acute Marginal (N/A) TRANSESOPHAGEAL ECHOCARDIOGRAM (TEE) (N/A) MITRAL VALVE REPAIR (MVR) (N/A) -NEURO- toxic metabolic encephalopathy. Has improved significantly. Much closer to baseline  CV- maintaining SR on amiodarone   BP elevated- dc midodrine, restart lopressor tomorrow  INR 1.34- will increase coumadin to 5 mg daily. Continue heparin until INR ~ 2  RESP- IS as able  RENAL- ESRD- plan per Nephrology  ENDO- CBG better controlled  Nutrition- on goal TF, passed swallow- will change to TF to 7P to 7A and see how his PO intake is  Deconditioning- continue PT/OT   LOS: 19 days    Melrose Nakayama 09/27/2017

## 2017-09-27 NOTE — Progress Notes (Signed)
ANTICOAGULATION CONSULT NOTE  Pharmacy Consult for Heparin  Indication: atrial fibrillation  [name]  Patient Measurements: Height: 5\' 9"  (175.3 cm) Weight: 223 lb 15.8 oz (101.6 kg) IBW/kg (Calculated) : 70.7 Heparin Dosing Weight: 92kg  Vital Signs: Temp: 98.2 F (36.8 C) (12/14 0300) Temp Source: Oral (12/14 0300) BP: 155/57 (12/13 2200) Pulse Rate: 92 (12/13 2200)  Labs: Recent Labs    09/25/17 0233 09/26/17 0357 09/27/17 0242  HGB 9.7* 9.6* 9.3*  HCT 27.6* 27.0* 27.1*  PLT 406* 426* 413*  LABPROT  --  15.8* 16.4*  INR  --  1.27 1.34  HEPARINUNFRC 0.54 0.60 0.55  CREATININE 6.52* 4.28* 5.75*    Estimated Creatinine Clearance: 14.3 mL/min (A) (by C-G formula based on SCr of 5.75 mg/dL (H)).  Assessment: 71 yo male s/p CABG and MV repair on 11/30 with Afib. Patient continues on heparin with warfarin started 12/12.  Heparin level continues to be at goal this morning, INR is relatively unchanged at 1.34. No bleeding issues noted, cbc wnl.  Goal of Therapy:  Heparin level 0.3-0.7 units/ml Monitor platelets by anticoagulation protocol: Yes   Plan:  Continue Heparin at current rate  Warfarin dosing per MD Monitor daily heparin level/CBC/INR, s/sx bleeding  Elicia Lamp, PharmD, BCPS Clinical Pharmacist Clinical phone for 09/27/2017 until 3:30pm: [phone] If after 3:30pm, please call main pharmacy at: [phone] 09/27/2017 7:32 AM

## 2017-09-27 NOTE — Progress Notes (Signed)
OT Cancellation Note  Patient Details Name: Ernest [name] MRN: [id number] DOB: 05/13/1946   Cancelled Treatment:    Reason Eval/Treat Not Completed: Patient at procedure or test/ unavailable. Pt in HD. Will follow.  Malka So 09/27/2017, 1:04 PM  09/27/2017 Nestor Lewandowsky, OTR/L Pager: [phone]

## 2017-09-27 NOTE — Clinical Social Work Note (Signed)
Clinical Social Work Assessment  Patient Details  Name: Ernest [name] MRN: [id number] Date of Birth: 1946/03/18  Date of referral:  09/27/17               Reason for consult:  Facility Placement                Permission sought to share information with:  Chartered certified accountant granted to share information::  No(pt has been disoriented)  Name::     [place]::  SNF  Relationship::  wife  Contact Information:     Housing/Transportation Living arrangements for the past 2 months:  Single Family Home Source of Information:  Spouse Patient Interpreter Needed:  None Criminal Activity/Legal Involvement Pertinent to Current Situation/Hospitalization:  No - Comment as needed Significant Relationships:  Spouse Lives with:  Spouse Do you feel safe going back to the place where you live?  No Need for family participation in patient care:  Yes (Comment)(wife was helping with ADLs at home prior to admission)  Care giving concerns:  Pt lives at home with wife- wife helped with cooking, cleaning prior ot admission but reports pt was independent with assistive devices.  Now needing max assist and wife does not think she could manage at home.   Social Worker assessment / plan:  CSW met with pt wife to discuss DC plan.  Explained SNF and SNF referral process- states pt has never been to SNF and they are unaware of facilities in this area.  Employment status:  Other (Comment) Insurance information:  Medicare, Managed Care PT Recommendations:  [place] / Referral to community resources:  [place]  Patient/Family's Response to care:  Pt wife is agreeable to SNF- understands she would be unable to care for pt at home.  Patient/Family's Understanding of and Emotional Response to Diagnosis, Current Treatment, and Prognosis:  Pt wife expresses good understanding of pt condition and current care needs- hopeful pt will improve  during hospital stay enough to go to inpatient rehab program.  Emotional Assessment Appearance:  Appears stated age Attitude/Demeanor/Rapport:  Unable to Assess Affect (typically observed):  Unable to Assess Orientation:  Oriented to Self, Oriented to Place, Oriented to Situation Alcohol / Substance use:  Not Applicable Psych involvement (Current and /or in the community):  No (Comment)  Discharge Needs  Concerns to be addressed:  Care Coordination Readmission within the last 30 days:  No Current discharge risk:  Physical Impairment Barriers to Discharge:  Continued Medical Work up   Jorge Ny, LCSW 09/27/2017, 12:10 PM

## 2017-09-27 NOTE — Progress Notes (Signed)
[facility] Progress Note  Subjective: upstairs on HD   Vitals:   09/27/17 1400 09/27/17 1430 09/27/17 1500 09/27/17 1515  BP: (!) 110/53 (!) 112/53 (!) 100/53 (!) 115/56  Pulse: 83 84 83 85  Resp:    13  Temp:    98.3 F (36.8 C)  TempSrc:    Oral  SpO2:    100%  Weight:    96.4 kg (212 lb 8.4 oz)  Height:        Inpatient medications: . amiodarone  200 mg Per NG tube BID  . amitriptyline  25 mg Oral QHS  . aspirin  81 mg Oral Daily  . atorvastatin  80 mg Oral q1800  . bisacodyl  10 mg Oral Daily   Or  . bisacodyl  10 mg Rectal Daily  . chlorhexidine gluconate (MEDLINE KIT)  15 mL Mouth Rinse BID  . cinacalcet  120 mg Oral Q supper  . clopidogrel  75 mg Oral Daily  . docusate sodium  200 mg Oral Daily  . doxercalciferol  2 mcg Intravenous Q M,W,F-HD  . feeding supplement (PRO-STAT SUGAR FREE 64)  30 mL Per Tube BID  . Influenza vac split quadrivalent PF  0.5 mL Intramuscular Tomorrow-1000  . insulin aspart  0-20 Units Subcutaneous TID WC  . insulin aspart  0-5 Units Subcutaneous QHS  . insulin detemir  20 Units Subcutaneous BID  . levETIRAcetam  1,000 mg Per Tube Daily  . levETIRAcetam  500 mg Per Tube Q M,W,F-HD  . mouth rinse  15 mL Mouth Rinse QID  . [START ON 09/28/2017] metoprolol tartrate  12.5 mg Oral BID  . multivitamin  1 tablet Oral QHS  . pantoprazole sodium  40 mg Per Tube Daily  . sevelamer carbonate  2,400 mg Oral TID WC  . warfarin  5 mg Oral q1800  . Warfarin - Physician Dosing Inpatient   Does not apply q1800   . sodium chloride    . sodium chloride    . sodium chloride    . sodium chloride    . sodium chloride    . sodium chloride    . feeding supplement (NEPRO CARB STEADY)    . heparin 1,750 Units/hr (09/27/17 1200)  . lactated ringers 20 mL/hr at 09/13/17 1600  . magnesium sulfate     sodium chloride, sodium chloride, sodium chloride, sodium chloride, sodium chloride, sodium chloride, alteplase, heparin, lidocaine (PF),  lidocaine-prilocaine, ondansetron (ZOFRAN) IV, pentafluoroprop-tetrafluoroeth  Exam: GEN: on HD, no distress PULM: clear bilat to bases CV: RRR no mrg, sternal scar intact ABD obese soft ntnd EXT: 1-2+ edema UE's  LUA AVG +bruit NEURO: follows simple commands, moves all ext  Home meds:  -metoprolol 25 bid -humalog tid/ lantus 10 hs -allopurinol 100/ lipitor 40/ colchicine biw/ MVI/ renvela ac/ sensipar 60 bid -ecasa 81/ plavix 75 -elavil 25 hs prn neuropath pain  CXR 12/12 - vasc congestion  Dialysis:[facility] MWF 4h 87mn 95.5kg 2/2 bath LUA AVG Hep none -venofer 50 q wk -calc 0.75 w HD    Impression: 1. CAD sp CABG: on 12/1. 2. AMS/ TME - slowly improving,  off pressors 3. Nut'n - TF per NG 4. ESRD HD MWF 5. Volume - up 1-2kg 6. Anemia of CKD: Hb 9 -11 range, no esa for now 7. DM , mult complications 8. Hx prior CVA's   Plan - HD today completed, next HD Monday   RKelly SplinterMD CMooringsportpager [phone]  09/27/2017, 4:07  PM   Recent Labs  Lab 09/20/17 1628  09/25/17 0233 09/26/17 0357 09/27/17 0242  NA  --    < > 130* 133* 132*  K  --    < > 3.6 3.8 3.8  CL  --    < > 90* 93* 93*  CO2  --    < > _0 GLUCOSE  --    < > 209* 232* 193*  BUN  --    < > 62* 40* 61*  CREATININE  --    < > 6.52* 4.28* 5.75*  CALCIUM  --    < > 8.3* 8.8* 9.0  PHOS 2.6  --   --   --   --    < > = values in this interval not displayed.   Recent Labs  Lab 09/21/17 0320 09/23/17 0226  AST 25 19  ALT 20 17  ALKPHOS 77 82  BILITOT 0.9 0.3  PROT 5.2* 5.5*  ALBUMIN 2.2* 2.2*   Recent Labs  Lab 09/25/17 0233 09/26/17 0357 09/27/17 0242  WBC 23.5* 22.9* 21.4*  HGB 9.7* 9.6* 9.3*  HCT 27.6* 27.0* 27.1*  MCV 80.5 80.1 80.9  PLT 406* 426* 413*   Iron/TIBC/Ferritin/ %Sat No results found for: IRON, TIBC, FERRITIN, IRONPCTSAT

## 2017-09-28 LAB — BASIC METABOLIC PANEL
ANION GAP: 11 (ref 5–15)
BUN: 45 mg/dL — ABNORMAL HIGH (ref 6–20)
CHLORIDE: 94 mmol/L — AB (ref 101–111)
CO2: 28 mmol/L (ref 22–32)
Calcium: 8.4 mg/dL — ABNORMAL LOW (ref 8.9–10.3)
Creatinine, Ser: 4.46 mg/dL — ABNORMAL HIGH (ref 0.61–1.24)
GFR calc Af Amer: 14 mL/min — ABNORMAL LOW (ref >60)
GFR calc non Af Amer: 12 mL/min — ABNORMAL LOW (ref >60)
GLUCOSE: 249 mg/dL — AB (ref 65–99)
POTASSIUM: 4.1 mmol/L (ref 3.5–5.1)
Sodium: 133 mmol/L — ABNORMAL LOW (ref 135–145)

## 2017-09-28 LAB — GLUCOSE, CAPILLARY
GLUCOSE-CAPILLARY: 150 mg/dL — AB (ref 65–99)
GLUCOSE-CAPILLARY: 132 mg/dL — AB (ref 65–99)
Glucose-Capillary: 233 mg/dL — ABNORMAL HIGH (ref 65–99)
Glucose-Capillary: 131 mg/dL — ABNORMAL HIGH (ref 65–99)

## 2017-09-28 LAB — CBC
HEMATOCRIT: 26.2 % — AB (ref 39.0–52.0)
HEMOGLOBIN: 8.8 g/dL — AB (ref 13.0–17.0)
MCH: 27.6 pg (ref 26.0–34.0)
MCHC: 33.6 g/dL (ref 30.0–36.0)
MCV: 82.1 fL (ref 78.0–100.0)
Platelets: 402 10*3/uL — ABNORMAL HIGH (ref 150–400)
RBC: 3.19 MIL/uL — ABNORMAL LOW (ref 4.22–5.81)
RDW: 16 % — ABNORMAL HIGH (ref 11.5–15.5)
WBC: 14.9 10*3/uL — AB (ref 4.0–10.5)

## 2017-09-28 LAB — PROTIME-INR
INR: 1.39
PROTHROMBIN TIME: 16.9 s — AB (ref 11.4–15.2)

## 2017-09-28 LAB — HEPARIN LEVEL (UNFRACTIONATED): Heparin Unfractionated: 0.52 IU/mL (ref 0.30–0.70)

## 2017-09-28 NOTE — Progress Notes (Signed)
[facility] Progress Note  Subjective: in restraints, "I'm trapped", no cough or SOB   Vitals:   09/28/17 0600 09/28/17 0746 09/28/17 0902 09/28/17 1113  BP: (!) 142/54  (!) 135/58   Pulse: 81  79   Resp: 15     Temp:  98.7 F (37.1 C)  97.8 F (36.6 C)  TempSrc:  Oral  Oral  SpO2: 96%     Weight:      Height:        Inpatient medications: . amiodarone  200 mg Per NG tube BID  . amitriptyline  25 mg Oral QHS  . aspirin  81 mg Oral Daily  . atorvastatin  80 mg Oral q1800  . bisacodyl  10 mg Oral Daily   Or  . bisacodyl  10 mg Rectal Daily  . chlorhexidine gluconate (MEDLINE KIT)  15 mL Mouth Rinse BID  . cinacalcet  120 mg Oral Q supper  . clopidogrel  75 mg Oral Daily  . docusate sodium  200 mg Oral Daily  . doxercalciferol  2 mcg Intravenous Q M,W,F-HD  . feeding supplement (PRO-STAT SUGAR FREE 64)  30 mL Per Tube BID  . Influenza vac split quadrivalent PF  0.5 mL Intramuscular Tomorrow-1000  . insulin aspart  0-20 Units Subcutaneous TID WC  . insulin aspart  0-5 Units Subcutaneous QHS  . insulin detemir  20 Units Subcutaneous BID  . levETIRAcetam  1,000 mg Per Tube Daily  . levETIRAcetam  500 mg Per Tube Q M,W,F-HD  . mouth rinse  15 mL Mouth Rinse QID  . metoprolol tartrate  12.5 mg Oral BID  . multivitamin  1 tablet Oral QHS  . pantoprazole sodium  40 mg Per Tube Daily  . sevelamer carbonate  2,400 mg Oral TID WC  . warfarin  5 mg Oral q1800  . Warfarin - Physician Dosing Inpatient   Does not apply q1800   . feeding supplement (NEPRO CARB STEADY) Stopped (09/28/17 0746)  . heparin 1,750 Units/hr (09/28/17 0600)  . lactated ringers 20 mL/hr at 09/13/17 1600  . magnesium sulfate     ondansetron (ZOFRAN) IV  Exam: GEN: oriented to [place]/ hospital, pres [place], not to year PULM: clear bilat to bases CV: RRR no mrg, sternal scar intact ABD obese soft ntnd EXT: 2+ LUE edema, min other edema  LUA AVG +bruit NEURO: follows simple commands,  moves all ext  Home meds:  -metoprolol 25 bid -humalog tid/ lantus 10 hs -allopurinol 100/ lipitor 40/ colchicine biw/ MVI/ renvela ac/ sensipar 60 bid -ecasa 81/ plavix 75 -elavil 25 hs prn neuropath pain  CXR 12/12 - vasc congestion  Dialysis:[facility] MWF 4h 69mn 95.5kg 2/2 bath LUA AVG Hep none -venofer 50 q wk -calc 0.75 w HD    Impression: 1. CAD sp CABG: on 12/1. 2. AMS/ TME - getting better 3. Nut'n - TF per NG, also getting po 4. ESRD HD MWF 5. Volume - stable 6. Anemia of CKD: Hb 9 -11 range, no esa for now 7. DM , mult complications 8. Hx prior CVA's   Plan - HD Monday   RKelly SplinterMD CFreder[facility] Hospital[facility] pager [phone]  09/28/2017, 11:58 AM   Recent Labs  Lab 09/26/17 0357 09/27/17 0242 09/28/17 0255  NA 133* 132* 133*  K 3.8 3.8 4.1  CL 93* 93* 94*  CO2 '29 27 28  ' GLUCOSE 232* 193* 249*  BUN 40* 61* 45*  CREATININE 4.28* 5.75* 4.46*  CALCIUM 8.8* 9.0  8.4*   Recent Labs  Lab 09/23/17 0226  AST 19  ALT 17  ALKPHOS 82  BILITOT 0.3  PROT 5.5*  ALBUMIN 2.2*   Recent Labs  Lab 09/26/17 0357 09/27/17 0242 09/28/17 0255  WBC 22.9* 21.4* 14.9*  HGB 9.6* 9.3* 8.8*  HCT 27.0* 27.1* 26.2*  MCV 80.1 80.9 82.1  PLT 426* 413* 402*   Iron/TIBC/Ferritin/ %Sat No results found for: IRON, TIBC, FERRITIN, IRONPCTSAT

## 2017-09-28 NOTE — Progress Notes (Signed)
ANTICOAGULATION CONSULT NOTE  Pharmacy Consult for Heparin  Indication: atrial fibrillation  [name]  Patient Measurements: Height: 5\' 9"  (175.3 cm) Weight: 218 lb 4.1 oz (99 kg) IBW/kg (Calculated) : 70.7 Heparin Dosing Weight: 92kg  Vital Signs: Temp: 97.8 F (36.6 C) (12/15 1113) Temp Source: Oral (12/15 1113) BP: 128/48 (12/15 1300) Pulse Rate: 80 (12/15 1300)  Labs: Recent Labs    09/26/17 0357 09/27/17 0242 09/28/17 0255  HGB 9.6* 9.3* 8.8*  HCT 27.0* 27.1* 26.2*  PLT 426* 413* 402*  LABPROT 15.8* 16.4* 16.9*  INR 1.27 1.34 1.39  HEPARINUNFRC 0.60 0.55 0.52  CREATININE 4.28* 5.75* 4.46*    Estimated Creatinine Clearance: 18.1 mL/min (A) (by C-G formula based on SCr of 4.46 mg/dL (H)).  Assessment: 71 yo male s/p CABG and MV repair on 11/30 with post op Afib . SR on amiodarone. Patient continues on heparin until INR > 2  warfarin started 12/12.  Heparin level 0.5 continues to be at goal this morning on heparin drip 1750 uts/hr. INR is starting to drift up at 1.39. No bleeding issues noted, cbc wnl.  Goal of Therapy:  Heparin level 0.3-0.7 units/ml Monitor platelets by anticoagulation protocol: Yes   Plan:  Continue Heparin at 1750 uts/hr Warfarin dosing per MD Monitor daily heparin level/CBC/INR, s/sx bleeding   Bonnita Nasuti Pharm.D. CPP, BCPS Clinical Pharmacist [phone] 09/28/2017 1:41 PM

## 2017-09-28 NOTE — Progress Notes (Signed)
[facility] DAILY ICU PROGRESS NOTE                   [address]            [place] [address]          [phone]   15 Days Post-Op Procedure(s) (LRB): CORONARY ARTERY BYPASS GRAFTING (CABG) times four LIMA  to LAD, SVG sequentially to OM and RAMUS Intermediate and SVG to Acute Marginal (N/A) TRANSESOPHAGEAL ECHOCARDIOGRAM (TEE) (N/A) MITRAL VALVE REPAIR (MVR) (N/A)  Total Length of Stay:  LOS: 20 days   Subjective: He is on restraints. He knows he is at [facility].   Objective: Vital signs in last 24 hours: Temp:  [97.7 F (36.5 C)-98.7 F (37.1 C)] 97.7 F (36.5 C) (12/15 1556) Pulse Rate:  [56-103] 81 (12/15 1800) Cardiac Rhythm: Normal sinus rhythm (12/15 1600) Resp:  [12-22] 16 (12/15 1800) BP: (100-149)/(46-98) 144/66 (12/15 1800) SpO2:  [91 %-100 %] 96 % (12/15 1800) Weight:  [218 lb 4.1 oz (99 kg)] 218 lb 4.1 oz (99 kg) (12/15 0400)  Filed Weights   09/27/17 1129 09/27/17 1515 09/28/17 0400  Weight: 219 lb 2.2 oz (99.4 kg) 212 lb 8.4 oz (96.4 kg) 218 lb 4.1 oz (99 kg)    Weight change: -13.6 oz (-2.2 kg)      Intake/Output from previous day: 12/14 0701 - 12/15 0700 In: 1152.5 [P.O.:100; I.V.:402.5; NG/GT:650] Out: 3000   Intake/Output this shift: No intake/output data recorded.  Current Meds: Scheduled Meds: . amiodarone  200 mg Per NG tube BID  . amitriptyline  25 mg Oral QHS  . aspirin  81 mg Oral Daily  . atorvastatin  80 mg Oral q1800  . bisacodyl  10 mg Oral Daily   Or  . bisacodyl  10 mg Rectal Daily  . chlorhexidine gluconate (MEDLINE KIT)  15 mL Mouth Rinse BID  . cinacalcet  120 mg Oral Q supper  . clopidogrel  75 mg Oral Daily  . docusate sodium  200 mg Oral Daily  . doxercalciferol  2 mcg Intravenous Q M,W,F-HD  . feeding supplement (PRO-STAT SUGAR FREE 64)  30 mL Per Tube BID  . Influenza vac split quadrivalent PF  0.5 mL Intramuscular Tomorrow-1000  . insulin aspart  0-20 Units Subcutaneous TID WC  . insulin  aspart  0-5 Units Subcutaneous QHS  . insulin detemir  20 Units Subcutaneous BID  . levETIRAcetam  1,000 mg Per Tube Daily  . levETIRAcetam  500 mg Per Tube Q M,W,F-HD  . mouth rinse  15 mL Mouth Rinse QID  . metoprolol tartrate  12.5 mg Oral BID  . multivitamin  1 tablet Oral QHS  . pantoprazole sodium  40 mg Per Tube Daily  . sevelamer carbonate  2,400 mg Oral TID WC  . warfarin  5 mg Oral q1800  . Warfarin - Physician Dosing Inpatient   Does not apply q1800   Continuous Infusions: . feeding supplement (NEPRO CARB STEADY) Stopped (09/28/17 0746)  . heparin 1,750 Units/hr (09/28/17 1653)  . lactated ringers 20 mL/hr at 09/13/17 1600  . magnesium sulfate     PRN Meds:.ondansetron (ZOFRAN) IV  Neurologic: He follows commands. He is in restraints. No focal deficits Heart: RRR Lungs: slightly decreased at bases Abdomen: Soft, sporadic bowel sounds Extremities: Trace LE edema Wound: Sternal wound is clean and dry;RLE wound is clean and dry  Lab Results: CBC: Recent Labs    09/27/17 0242 09/28/17 0255  WBC 21.4* 14.9*  HGB 9.3* 8.8*  HCT 27.1* 26.2*  PLT 413* 402*   BMET:  Recent Labs    09/27/17 0242 09/28/17 0255  NA 132* 133*  K 3.8 4.1  CL 93* 94*  CO2 27 28  GLUCOSE 193* 249*  BUN 61* 45*  CREATININE 5.75* 4.46*  CALCIUM 9.0 8.4*    CMET: Lab Results  Component Value Date   WBC 14.9 (H) 09/28/2017   HGB 8.8 (L) 09/28/2017   HCT 26.2 (L) 09/28/2017   PLT 402 (H) 09/28/2017   GLUCOSE 249 (H) 09/28/2017   CHOL 116 10/28/2016   TRIG 92 10/28/2016   HDL 48 10/28/2016   LDLCALC 50 10/28/2016   ALT 17 09/23/2017   AST 19 09/23/2017   NA 133 (L) 09/28/2017   K 4.1 09/28/2017   CL 94 (L) 09/28/2017   CREATININE 4.46 (H) 09/28/2017   BUN 45 (H) 09/28/2017   CO2 28 09/28/2017   TSH 1.064 07/12/2015   INR 1.39 09/28/2017   HGBA1C 9.1 (H) 09/08/2017      PT/INR:  Recent Labs    09/28/17 0255  LABPROT 16.9*  INR 1.39   Radiology: No results  found.   Assessment/Plan: S/P Procedure(s) (LRB): CORONARY ARTERY BYPASS GRAFTING (CABG) times four LIMA  to LAD, SVG sequentially to OM and RAMUS Intermediate and SVG to Acute Marginal (N/A) TRANSESOPHAGEAL ECHOCARDIOGRAM (TEE) (N/A) MITRAL VALVE REPAIR (MVR) (N/A)  1. CV-Previous a fib. SR, first degree heart block. On Heparin drip. Also, on Amiodarone 200 mg bid, Lopressor 12.5 mg bid, Plavix 75 mg daily, and Coumadin. INR 1.39 so will continue with 5 mg Coumadin tonight. Will stop Heparin drip once INR about 2. 2. Pulmonary-on 1 liter of oxygen via [place]. Encourage incentive spirometer. 3. Anemia of chronic disease-H and H decreased to 8.8 and 26.2 4. ESRD-Creatinine 4.46 today. HD M/W/Fri. Nephrology following. 5. DM-CBGs 233/150/131.On Insulin. Pre op HGA1C  9.1. He will require close medical follow up with medical doctor after discharge. 6. Need to remove EPW before INR gets too high;possibly in am? 7. Neurologic-toxic metabolic encephalopathy. Apparently, this has improved a lot since early post op. 8. GI-Speech path evaluated on 12/14 and diet recommendations followed accordingly (dysphagia 1)  Donielle M Zimmerman [name] 09/28/2017 7:26 PM 

## 2017-09-29 LAB — BASIC METABOLIC PANEL
ANION GAP: 13 (ref 5–15)
BUN: 69 mg/dL — AB (ref 6–20)
CO2: 25 mmol/L (ref 22–32)
Calcium: 8.7 mg/dL — ABNORMAL LOW (ref 8.9–10.3)
Chloride: 93 mmol/L — ABNORMAL LOW (ref 101–111)
Creatinine, Ser: 6.04 mg/dL — ABNORMAL HIGH (ref 0.61–1.24)
GFR calc Af Amer: 10 mL/min — ABNORMAL LOW (ref >60)
GFR calc non Af Amer: 8 mL/min — ABNORMAL LOW (ref >60)
GLUCOSE: 199 mg/dL — AB (ref 65–99)
Potassium: 4.5 mmol/L (ref 3.5–5.1)
Sodium: 131 mmol/L — ABNORMAL LOW (ref 135–145)

## 2017-09-29 LAB — GLUCOSE, CAPILLARY
GLUCOSE-CAPILLARY: 276 mg/dL — AB (ref 65–99)
Glucose-Capillary: 215 mg/dL — ABNORMAL HIGH (ref 65–99)
Glucose-Capillary: 139 mg/dL — ABNORMAL HIGH (ref 65–99)
Glucose-Capillary: 109 mg/dL — ABNORMAL HIGH (ref 65–99)

## 2017-09-29 LAB — CBC
HCT: 25.8 % — ABNORMAL LOW (ref 39.0–52.0)
Hemoglobin: 8.9 g/dL — ABNORMAL LOW (ref 13.0–17.0)
MCH: 28.4 pg (ref 26.0–34.0)
MCHC: 34.5 g/dL (ref 30.0–36.0)
MCV: 82.4 fL (ref 78.0–100.0)
PLATELETS: 403 10*3/uL — AB (ref 150–400)
RBC: 3.13 MIL/uL — AB (ref 4.22–5.81)
RDW: 16 % — AB (ref 11.5–15.5)
WBC: 14.6 10*3/uL — ABNORMAL HIGH (ref 4.0–10.5)

## 2017-09-29 LAB — PROTIME-INR
INR: 1.55
Prothrombin Time: 18.5 seconds — ABNORMAL HIGH (ref 11.4–15.2)

## 2017-09-29 LAB — HEPARIN LEVEL (UNFRACTIONATED)
Heparin Unfractionated: <0.1 IU/mL — ABNORMAL LOW (ref 0.30–0.70)
Heparin Unfractionated: 0.84 IU/mL — ABNORMAL HIGH (ref 0.30–0.70)
Heparin Unfractionated: 0.53 IU/mL (ref 0.30–0.70)

## 2017-09-29 LAB — APTT: aPTT: 29 seconds (ref 24–36)

## 2017-09-29 NOTE — Progress Notes (Signed)
  Speech Language Pathology Treatment: Dysphagia  Patient Details Name: Ernest [name] MRN: [id number] DOB: 16-Nov-1945 Today's Date: 09/29/2017 Time: 5790-3833 SLP Time Calculation (min) (ACUTE ONLY): 12 min  Assessment / Plan / Recommendation Clinical Impression  Dysphagia treatment provided for diet tolerance/ consider advancement. Pt showed no overt s/s of aspiration given thin liquid, puree, or mech soft consistencies. Pt did have a mildly prolonged oral phase with mech soft consistency trial but was able to chew and no oral residuals noted post-swallow. RN reported no difficulties noted during meals. Pt continues to have some confusion. Recommend advancing diet to dysphagia 2, thin liquids, meds crushed in puree, full supervision to assist with feeding. Will continue to follow for diet tolerance/ consider advancement.  HPI HPI: Pt adm 11/24 with NSTEMI and underwent CABG x 4 and MVR on 08/18/17. Intubated 11/30-12/1. Post op lethargy and confusion, stroke suspected but CT found No acute intracranial process, stable mild-to-moderate chronic small vessel ischemic disease and old small cerebellar infarcts. CXR 12/12 No acute intracranial process, stable mild parenchymal brain volume loss, stable mild-to-moderate chronic small vessel ischemic disease and old small cerebellar infarcts. BSE 06/2015 functional swallow, regular/thin recommended. PMH - Multiple CVA's, DM, HTN, chf, ESRD on HD, cad, copd.      SLP Plan  Continue with current plan of care       Recommendations  Diet recommendations: Dysphagia 2 (fine chop);Thin liquid Liquids provided via: Cup;Straw Medication Administration: Crushed with puree Supervision: Staff to assist with self feeding;Full supervision/cueing for compensatory strategies Compensations: Minimize environmental distractions;Slow rate;Small sips/bites Postural Changes and/or Swallow Maneuvers: Seated upright 90 degrees                Oral Care  Recommendations: Oral care BID Follow up Recommendations: Skilled Nursing facility SLP Visit Diagnosis: Dysphagia, unspecified (R13.10) Plan: Continue with current plan of care       Pony, Bonnie, [place] 09/29/2017, 2:38 PM  [phone]

## 2017-09-29 NOTE — Progress Notes (Signed)
[facility] Progress Note  Subjective: no new c/o   Vitals:   09/29/17 0900 09/29/17 1000 09/29/17 1100 09/29/17 1120  BP: (!) 144/58 (!) 145/58 (!) 117/54   Pulse: 80 80 78   Resp: 14 14 20   Temp:    98 F (36.7 C)  TempSrc:    Oral  SpO2: 95% 95% 94%   Weight: 99.9 kg (220 lb 3.8 oz)     Height:        Inpatient medications: . amiodarone  200 mg Per NG tube BID  . amitriptyline  25 mg Oral QHS  . aspirin  81 mg Oral Daily  . atorvastatin  80 mg Oral q1800  . bisacodyl  10 mg Oral Daily   Or  . bisacodyl  10 mg Rectal Daily  . chlorhexidine gluconate (MEDLINE KIT)  15 mL Mouth Rinse BID  . cinacalcet  120 mg Oral Q supper  . clopidogrel  75 mg Oral Daily  . docusate sodium  200 mg Oral Daily  . doxercalciferol  2 mcg Intravenous Q M,W,F-HD  . feeding supplement (PRO-STAT SUGAR FREE 64)  30 mL Per Tube BID  . Influenza vac split quadrivalent PF  0.5 mL Intramuscular Tomorrow-1000  . insulin aspart  0-20 Units Subcutaneous TID WC  . insulin aspart  0-5 Units Subcutaneous QHS  . insulin detemir  20 Units Subcutaneous BID  . levETIRAcetam  1,000 mg Per Tube Daily  . levETIRAcetam  500 mg Per Tube Q M,W,F-HD  . mouth rinse  15 mL Mouth Rinse QID  . metoprolol tartrate  12.5 mg Oral BID  . multivitamin  1 tablet Oral QHS  . pantoprazole sodium  40 mg Per Tube Daily  . sevelamer carbonate  2,400 mg Oral TID WC  . warfarin  5 mg Oral q1800  . Warfarin - Physician Dosing Inpatient   Does not apply q1800   . feeding supplement (NEPRO CARB STEADY) Stopped (09/29/17 1030)  . heparin 1,750 Units/hr (09/29/17 1100)  . lactated ringers 20 mL/hr at 09/13/17 1600  . magnesium sulfate     ondansetron (ZOFRAN) IV  Exam: GEN: oriented to [place]/ hospital, pres [place], not to year PULM: clear bilat to bases CV: RRR no mrg, sternal scar intact ABD obese soft ntnd EXT: 2+ LUE edema, min other edema  LUA AVG +bruit NEURO: follows simple commands, moves all  ext  Home meds:  -metoprolol 25 bid -humalog tid/ lantus 10 hs -allopurinol 100/ lipitor 40/ colchicine biw/ MVI/ renvela ac/ sensipar 60 bid -ecasa 81/ plavix 75 -elavil 25 hs prn neuropath pain  CXR 12/12 - vasc congestion  Dialysis:[facility] MWF 4h 15min 95.5kg 2/2 bath LUA AVG Hep none -venofer 50 q wk -calc 0.75 w HD    Impression: 1. CAD sp CABG: on 12/1. 2. AMS/ TME - getting better slowly 3. Nut'n - TF per NG, also getting po 4. ESRD HD MWF 5. Volume - stable 6. Anemia of CKD: Hb 9 -11 range, no esa for now 7. DM , mult complications 8. Hx prior CVA's   Plan - HD Monday, UF as tol   Rob  MD [facility] pager [phone]   09/29/2017, 12:07 PM   Recent Labs  Lab 09/27/17 0242 09/28/17 0255 09/29/17 0410  NA 132* 133* 131*  K 3.8 4.1 4.5  CL 93* 94* 93*  CO2 27 28 25  GLUCOSE 193* 249* 199*  BUN 61* 45* 69*  CREATININE 5.75* 4.46* 6.04*  CALCIUM   9.0 8.4* 8.7*   Recent Labs  Lab 09/23/17 0226  AST 19  ALT 17  ALKPHOS 82  BILITOT 0.3  PROT 5.5*  ALBUMIN 2.2*   Recent Labs  Lab 09/27/17 0242 09/28/17 0255 09/29/17 0410  WBC 21.4* 14.9* 14.6*  HGB 9.3* 8.8* 8.9*  HCT 27.1* 26.2* 25.8*  MCV 80.9 82.1 82.4  PLT 413* 402* 403*   Iron/TIBC/Ferritin/ %Sat No results found for: IRON, TIBC, FERRITIN, IRONPCTSAT

## 2017-09-29 NOTE — Progress Notes (Signed)
16 Days Post-Op Procedure(s) (LRB): CORONARY ARTERY BYPASS GRAFTING (CABG) times four LIMA  to LAD, SVG sequentially to OM and RAMUS Intermediate and SVG to Acute Marginal (N/A) TRANSESOPHAGEAL ECHOCARDIOGRAM (TEE) (N/A) MITRAL VALVE REPAIR (MVR) (N/A) Subjective: dysphagia diet during day, TF at night Iv heparin bridge on coumadin load, INR 1.55stable a-flutter Incision clean dry  Objective: Vital signs in last 24 hours: Temp:  [97.3 F (36.3 C)-98 F (36.7 C)] 98 F (36.7 C) (12/16 1200) Pulse Rate:  [78-110] 79 (12/16 1200) Cardiac Rhythm: Normal sinus rhythm (12/16 1200) Resp:  [10-22] 13 (12/16 1200) BP: (102-149)/(48-107) 146/66 (12/16 1200) SpO2:  [91 %-100 %] 97 % (12/16 1200) Weight:  [220 lb 3.8 oz (99.9 kg)-225 lb 1.4 oz (102.1 kg)] 220 lb 3.8 oz (99.9 kg) (12/16 0900)  Hemodynamic parameters for last 24 hours:    Intake/Output from previous day: 12/15 0701 - 12/16 0700 In: 1370.8 [P.O.:480; I.V.:402.5; NG/GT:488.3] Out: 121 [Emesis/NG output:120; Stool:1] Intake/Output this shift: Total I/O In: 916.7 [P.O.:240; I.V.:105; NG/GT:571.7] Out: -   Too weak to get OOB  Lab Results: Recent Labs    09/28/17 0255 09/29/17 0410  WBC 14.9* 14.6*  HGB 8.8* 8.9*  HCT 26.2* 25.8*  PLT 402* 403*   BMET:  Recent Labs    09/28/17 0255 09/29/17 0410  NA 133* 131*  K 4.1 4.5  CL 94* 93*  CO2 28 25  GLUCOSE 249* 199*  BUN 45* 69*  CREATININE 4.46* 6.04*  CALCIUM 8.4* 8.7*    PT/INR:  Recent Labs    09/29/17 0418  LABPROT 18.5*  INR 1.55   ABG    Component Value Date/Time   PHART 7.427 09/15/2017 0320   HCO3 24.3 09/15/2017 0320   TCO2 26 09/14/2017 1645   ACIDBASEDEF 4.0 (H) 09/14/2017 0153   O2SAT 75.0 09/20/2017 0805   CBG (last 3)  Recent Labs    09/28/17 2118 09/29/17 0725 09/29/17 1119  GLUCAP 132* 215* 276*    Assessment/Plan: S/P Procedure(s) (LRB): CORONARY ARTERY BYPASS GRAFTING (CABG) times four LIMA  to LAD, SVG sequentially to  OM and RAMUS Intermediate and SVG to Acute Marginal (N/A) TRANSESOPHAGEAL ECHOCARDIOGRAM (TEE) (N/A) MITRAL VALVE REPAIR (MVR) (N/A) Cont current care Stop heparin when INR therapeutic   LOS: 21 days    Tharon Aquas Trigt III 09/29/2017

## 2017-09-29 NOTE — Progress Notes (Signed)
ANTICOAGULATION CONSULT NOTE  Pharmacy Consult for Heparin  Indication: atrial fibrillation  [name]  Patient Measurements: Height: 5\' 9"  (175.3 cm) Weight: 220 lb 3.8 oz (99.9 kg) IBW/kg (Calculated) : 70.7 Heparin Dosing Weight: 92kg  Vital Signs: Temp: 97.9 F (36.6 C) (12/16 0800) Temp Source: Oral (12/16 0800) BP: 145/58 (12/16 1000) Pulse Rate: 80 (12/16 1000)  Labs: Recent Labs    09/27/17 0242 09/28/17 0255 09/29/17 0410 09/29/17 0418 09/29/17 0549 09/29/17 0804  HGB 9.3* 8.8* 8.9*  --   --   --   HCT 27.1* 26.2* 25.8*  --   --   --   PLT 413* 402* 403*  --   --   --   APTT  --   --  29  --   --   --   LABPROT 16.4* 16.9*  --  18.5*  --   --   INR 1.34 1.39  --  1.55  --   --   HEPARINUNFRC 0.55 0.52 <0.10*  --  0.84* 0.53  CREATININE 5.75* 4.46* 6.04*  --   --   --     Estimated Creatinine Clearance: 13.5 mL/min (A) (by C-G formula based on SCr of 6.04 mg/dL (H)).  Assessment: 71 yo male s/p CABG and MV repair on 11/30 with post op Afib . SR on amiodarone. Patient continues on heparin until INR > 2  warfarin started 12/12.  Heparin level 0.5 continues to be at goal this morning on heparin drip 1750 uts/hr. INR is starting to drift up at 1.55 No bleeding issues noted, cbc wnl.  Of note several HL drawn this am initial with AML heparin level undetectable - all lines ok and infusion running as previous, redraw HL 0.83 much greater than previous  - discussed with nursing staff - lab draw but I could not find a bandage covering lab stick  Again ordered HL result 0.5 on par with all previous levels - will continue surrent rate  Goal of Therapy:  Heparin level 0.3-0.7 units/ml Monitor platelets by anticoagulation protocol: Yes   Plan:  Continue Heparin at 1750 uts/hr Warfarin dosing per MD Monitor daily heparin level/CBC/INR, s/sx bleeding   Bonnita Nasuti Pharm.D. CPP, BCPS Clinical Pharmacist [phone] 09/29/2017 10:53 AM

## 2017-09-30 LAB — BASIC METABOLIC PANEL
Anion gap: 15 (ref 5–15)
BUN: 91 mg/dL — ABNORMAL HIGH (ref 6–20)
CO2: 25 mmol/L (ref 22–32)
Calcium: 9 mg/dL (ref 8.9–10.3)
Chloride: 91 mmol/L — ABNORMAL LOW (ref 101–111)
Creatinine, Ser: 7.33 mg/dL — ABNORMAL HIGH (ref 0.61–1.24)
GFR calc Af Amer: 8 mL/min — ABNORMAL LOW (ref >60)
GFR calc non Af Amer: 7 mL/min — ABNORMAL LOW (ref >60)
Glucose, Bld: 180 mg/dL — ABNORMAL HIGH (ref 65–99)
Potassium: 4.6 mmol/L (ref 3.5–5.1)
Sodium: 131 mmol/L — ABNORMAL LOW (ref 135–145)

## 2017-09-30 LAB — CBC
HEMATOCRIT: 26.3 % — AB (ref 39.0–52.0)
Hemoglobin: 8.9 g/dL — ABNORMAL LOW (ref 13.0–17.0)
MCH: 27.6 pg (ref 26.0–34.0)
MCHC: 33.8 g/dL (ref 30.0–36.0)
MCV: 81.7 fL (ref 78.0–100.0)
Platelets: 448 10*3/uL — ABNORMAL HIGH (ref 150–400)
RBC: 3.22 MIL/uL — ABNORMAL LOW (ref 4.22–5.81)
RDW: 15.9 % — AB (ref 11.5–15.5)
WBC: 15.3 10*3/uL — AB (ref 4.0–10.5)

## 2017-09-30 LAB — HEPARIN LEVEL (UNFRACTIONATED): HEPARIN UNFRACTIONATED: 0.79 [IU]/mL — AB (ref 0.30–0.70)

## 2017-09-30 LAB — GLUCOSE, CAPILLARY
GLUCOSE-CAPILLARY: 214 mg/dL — AB (ref 65–99)
Glucose-Capillary: 198 mg/dL — ABNORMAL HIGH (ref 65–99)

## 2017-09-30 LAB — PROTIME-INR
INR: 1.91
Prothrombin Time: 21.7 seconds — ABNORMAL HIGH (ref 11.4–15.2)

## 2017-09-30 MED ORDER — SODIUM CHLORIDE 0.9 % IV SOLN: 100.0000 mL | INTRAVENOUS | Status: DC | PRN

## 2017-09-30 MED ORDER — LIDOCAINE-PRILOCAINE 2.5-2.5 % EX CREA: 1.0000 "application " | TOPICAL_CREAM | CUTANEOUS | Status: DC | PRN

## 2017-09-30 MED ORDER — MOVING RIGHT ALONG BOOK
Freq: Once | Status: DC
  Filled 2017-09-30: qty 1

## 2017-09-30 MED ORDER — HEPARIN SODIUM (PORCINE) 1000 UNIT/ML DIALYSIS: 1000.0000 [IU] | INTRAMUSCULAR | Status: DC | PRN

## 2017-09-30 MED ORDER — LIDOCAINE HCL (PF) 1 % IJ SOLN: 5.0000 mL | INTRAMUSCULAR | Status: DC | PRN

## 2017-09-30 MED ORDER — NEPRO/CARBSTEADY PO LIQD
237.0000 mL | Freq: Two times a day (BID) | ORAL | Status: DC
  Administered 2017-10-03 (×2): 237 mL via ORAL

## 2017-09-30 MED ORDER — INSULIN ASPART 100 UNIT/ML ~~LOC~~ SOLN
0.0000 [IU] | Freq: Three times a day (TID) | SUBCUTANEOUS | Status: DC
  Administered 2017-09-30: 2 [IU] via SUBCUTANEOUS
  Administered 2017-10-01 – 2017-10-02 (×3): 1 [IU] via SUBCUTANEOUS

## 2017-09-30 MED ORDER — SODIUM CHLORIDE 0.9% FLUSH: 3.0000 mL | INTRAVENOUS | Status: DC | PRN

## 2017-09-30 MED ORDER — SODIUM CHLORIDE 0.9% FLUSH
3.0000 mL | Freq: Two times a day (BID) | INTRAVENOUS | Status: DC
  Administered 2017-09-30 – 2017-10-03 (×7): 3 mL via INTRAVENOUS

## 2017-09-30 MED ORDER — RESOURCE THICKENUP CLEAR PO POWD
ORAL | Status: DC | PRN
  Filled 2017-09-30: qty 125

## 2017-09-30 MED ORDER — METOPROLOL TARTRATE 25 MG PO TABS
25.0000 mg | ORAL_TABLET | Freq: Two times a day (BID) | ORAL | Status: DC
  Administered 2017-09-30 – 2017-10-03 (×4): 25 mg via ORAL
  Filled 2017-09-30 (×6): qty 1

## 2017-09-30 MED ORDER — DOXERCALCIFEROL 4 MCG/2ML IV SOLN
INTRAVENOUS | Status: AC
  Filled 2017-09-30: qty 2

## 2017-09-30 MED ORDER — PENTAFLUOROPROP-TETRAFLUOROETH EX AERO: 1.0000 "application " | INHALATION_SPRAY | CUTANEOUS | Status: DC | PRN

## 2017-09-30 MED ORDER — ALTEPLASE 2 MG IJ SOLR: 2.0000 mg | Freq: Once | INTRAMUSCULAR | Status: DC | PRN

## 2017-09-30 MED ORDER — SODIUM CHLORIDE 0.9 % IV SOLN: 250.0000 mL | INTRAVENOUS | Status: DC | PRN

## 2017-09-30 NOTE — Progress Notes (Signed)
Physical Therapy Treatment Patient Details Name: Ernest [name] MRN: [id number] DOB: 10-14-1946 Today's Date: 09/30/2017    History of Present Illness Pt adm 11/24 with NSTEMI and underwent CABG x 4 and MVR on 08/18/17. Post op lethargy and confusion. PMH - Multiple CVA's, DM, HTN, chf, ESRD on HD, cad, copd    PT Comments    Pt on arrival alert and able to recall sternal precautions and lift LE for sock donning. As patient assisted to sit edge of bed he presented with strong Lateral lean to the L and required mechanical lift from bed to recliner chair.  Pt declining in functional mobility from previous session and presents with increased edema in LUE.  Informed nursing of change from prior level of function during this admission.  Pt continues to require SNF placement at d/c as he is requiring lift equipment for transfers at this time.    Follow Up Recommendations  Supervision/Assistance - 24 hour;SNF     Equipment Recommendations  Other (comment)    Recommendations for Other Services       Precautions / Restrictions Precautions Precautions: Sternal;Fall Restrictions Weight Bearing Restrictions: Yes(sternal precautions) Other Position/Activity Restrictions: Sternal precautions    Mobility  Bed Mobility Overal bed mobility: Needs Assistance Bed Mobility: Supine to Sit     Supine to sit: Total assist;+2 for physical assistance     General bed mobility comments: Pt able to initiate movement but unable to complete 25% of the task.   Pt barely able to hold to heart pillow and presents with increased edema in L arm.  assist for LE advancement and trunk elevation in sitting.  Once in sitting patient with strong lateral lean to the L.    Transfers Overall transfer level: Needs assistance Equipment used: (sara stedy) Transfers: Sit to/from Stand Sit to Stand: Total assist;+2 physical assistance         General transfer comment: Attempted sit<>stand with total assistance +2  with and without Stedy frame (for knee blocking only without pt pulling up due to sternal precautions). However, both attempts unsuccessful. Ultimately utilized maximove to assist pt to chair.   Ambulation/Gait Ambulation/Gait assistance: (unable, pt declining from previous session and unable to even achieve standing at this time.  )           General Gait Details: not attempted today   Stairs            Wheelchair Mobility    Modified Rankin (Stroke Patients Only)       Balance Overall balance assessment: Needs assistance Sitting-balance support: Feet supported Sitting balance-Leahy Scale: Poor Sitting balance - Comments: Pt requiring max to total assistance and hands on facilitation for majority of time seated at EOB. Unable to determine midline position. Fluctuating to min assist briefly for 2 instances.  Postural control: Left lateral lean(max facilitation to correct.  )   Standing balance-Leahy Scale: Zero Standing balance comment: Pt unable to stand or lift hips from mattress.                              Cognition Arousal/Alertness: Lethargic;Awake/alert(varried during session) Behavior During Therapy: Flat affect Overall Cognitive Status: Impaired/Different from baseline Area of Impairment: Orientation;Attention;Following commands;Problem solving;Awareness;Safety/judgement                 Orientation Level: Disoriented to;Time;Place Current Attention Level: Sustained;Focused(waxing and waning)   Following Commands: Follows one step commands inconsistently Safety/Judgement: Decreased awareness of safety  Awareness: (pre-intellectual) Problem Solving: Slow processing;Decreased initiation;Difficulty sequencing;Requires verbal cues;Requires tactile cues General Comments: Pt reporting that it is Thursday and requiring multiple choices for determining his current location. Pt following one-step commands intermittently. Waxing and waning cognition  throughout session. For example, able to state sternal precautions at beginning of session but also moments of staring off and decreased response to commands.       Exercises      General Comments General comments (skin integrity, edema, etc.): L UE significant edema. Intermittent SpO2 desaturation to 80's with quick rebounds to low 90s with cueing (on RA throughout).       Pertinent Vitals/Pain Pain Assessment: No/denies pain Pain Location: Pt denies pain this session; however waxing and waning cognition. No grimacing or indication of distress Pain Intervention(s): Limited activity within patient's tolerance;Monitored during session;Repositioned    Home Living                      Prior Function            PT Goals (current goals can now be found in the care plan section) Acute Rehab PT Goals Patient Stated Goal: None stated Potential to Achieve Goals: Poor Progress towards PT goals: Not progressing toward goals - comment    Frequency    Min 3X/week      PT Plan Current plan remains appropriate    Co-evaluation PT/OT/SLP Co-Evaluation/Treatment: Yes Reason for Co-Treatment: Complexity of the patient's impairments (multi-system involvement);Necessary to address cognition/behavior during functional activity;For patient/therapist safety   OT goals addressed during session: ADL's and self-care;Strengthening/ROM      AM-PAC PT "6 Clicks" Daily Activity  Outcome Measure  Difficulty turning over in bed (including adjusting bedclothes, sheets and blankets)?: Unable Difficulty moving from lying on back to sitting on the side of the bed? : Unable Difficulty sitting down on and standing up from a chair with arms (e.g., wheelchair, bedside commode, etc,.)?: Unable Help needed moving to and from a bed to chair (including a wheelchair)?: A Lot Help needed walking in hospital room?: A Lot Help needed climbing 3-5 steps with a railing? : Total 6 Click Score: 8     End of Session Equipment Utilized During Treatment: Gait belt Activity Tolerance: Patient tolerated treatment well Patient left: in chair;with call bell/phone within reach;with chair alarm set Nurse Communication: Mobility status PT Visit Diagnosis: Other abnormalities of gait and mobility (R26.89);Muscle weakness (generalized) (M62.81);Difficulty in walking, not elsewhere classified (R26.2)     Time: 2683-4196 PT Time Calculation (min) (ACUTE ONLY): 36 min  Charges:  $Therapeutic Activity: 8-22 mins                    G Codes:       Governor Rooks, PTA pager [phone]    Cristela Blue 09/30/2017, 5:17 PM

## 2017-09-30 NOTE — Addendum Note (Signed)
Addendum  created 09/30/17 0755 by Rica Koyanagi, MD   Child order released for a procedure order, Intraprocedure Blocks edited, LDA created via procedure documentation, Sign clinical note

## 2017-09-30 NOTE — Progress Notes (Signed)
      [address]       [place] [address]             [phone]      Mr. Gell is in no distress currently. His speech is a little less clear than it was this morning, but he has waxed and waned more than this throughout his hospitalization.  Will ask Vascular to assess left arm as swelling appears a little worse than it has been.  Revonda Standard Roxan Hockey, MD [facility] [phone]

## 2017-09-30 NOTE — Discharge Instructions (Addendum)
Coronary Artery Bypass Grafting, Care After This sheet gives you information about how to care for yourself after your procedure. Your health care provider may also give you more specific instructions. If you have problems or questions, contact your health care provider. What can I expect after the procedure? After the procedure, it is common to have:  Nausea and a lack of appetite.  Constipation.  Weakness and fatigue.  Depression or irritability.  Pain or discomfort in your incision areas.  Follow these instructions at home: Medicines  Take over-the-counter and prescription medicines only as told by your health care provider. Do not stop taking medicines or start any new medicines without approval from your health care provider.  If you were prescribed an antibiotic medicine, take it as told by your health care provider. Do not stop taking the antibiotic even if you start to feel better.  Do not drive or use heavy machinery while taking prescription pain medicine. Incision care  Follow instructions from your health care provider about how to take care of your incisions. Make sure you: ? Wash your hands with soap and water before you change your bandage (dressing). If soap and water are not available, use hand sanitizer. ? Change your dressing as told by your health care provider. ? Leave stitches (sutures), skin glue, or adhesive strips in place. These skin closures may need to stay in place for 2 weeks or longer. If adhesive strip edges start to loosen and curl up, you may trim the loose edges. Do not remove adhesive strips completely unless your health care provider tells you to do that.  Keep incision areas clean, dry, and protected.  Check your incision areas every day for signs of infection. Check for: ? More redness, swelling, or pain. ? More fluid or blood. ? Warmth. ? Pus or a bad smell.  If incisions were made in your legs: ? Avoid crossing your legs. ? Avoid  sitting for long periods of time. Change positions every 30 minutes. ? Raise (elevate) your legs when you are sitting. Bathing  Do not take baths, swim, or use a hot tub until your health care provider approves.  Only take sponge baths. Pat the incisions dry. Do not rub incisions with a washcloth or towel.  Ask your health care provider when you can shower. Eating and drinking  Eat foods that are high in fiber, such as raw fruits and vegetables, whole grains, beans, and nuts. Meats should be lean cut. Avoid canned, processed, and fried foods. This can help prevent constipation and is a recommended part of a heart-healthy diet.  Drink enough fluid to keep your urine clear or pale yellow.  Limit alcohol intake to no more than 1 drink a day for nonpregnant women and 2 drinks a day for men. One drink equals 12 oz of beer, 5 oz of wine, or 1 oz of hard liquor. Activity  Rest and limit your activity as told by your health care provider. You may be instructed to: ? Stop any activity right away if you have chest pain, shortness of breath, irregular heartbeats, or dizziness. Get help right away if you have any of these symptoms. ? Move around frequently for short periods or take short walks as directed by your health care provider. Gradually increase your activities. You may need physical therapy or cardiac rehabilitation to help strengthen your muscles and build your endurance. ? Avoid lifting, pushing, or pulling anything that is heavier than 10 lb (4.5 kg) for at  least 6 weeks or as told by your health care provider.  Do not drive until your health care provider approves.  Ask your health care provider when you may return to work.  Ask your health care provider when you may resume sexual activity. General instructions  Do not use any products that contain nicotine or tobacco, such as cigarettes and e-cigarettes. If you need help quitting, ask your health care provider.  Take 2-3 deep  breaths every few hours during the day, while you recover. This helps expand your lungs and prevent complications like pneumonia after surgery.  If you were given a device called an incentive spirometer, use it several times a day to practice deep breathing. Support your chest with a pillow or your arms when you take deep breaths or cough.  Wear compression stockings as told by your health care provider. These stockings help to prevent blood clots and reduce swelling in your legs.  Weigh yourself every day. This helps identify if your body is holding (retaining) fluid that may make your heart and lungs work harder.  Keep all follow-up visits as told by your health care provider. This is important. Contact a health care provider if:  You have more redness, swelling, or pain around any incision.  You have more fluid or blood coming from any incision.  Any incision feels warm to the touch.  You have pus or a bad smell coming from any incision  You have a fever.  You have swelling in your ankles or legs.  You have pain in your legs.  You gain 2 lb (0.9 kg) or more a day.  You are nauseous or you vomit.  You have diarrhea. Get help right away if:  You have chest pain that spreads to your jaw or arms.  You are short of breath.  You have a fast or irregular heartbeat.  You notice a "clicking" in your breastbone (sternum) when you move.  You have numbness or weakness in your arms or legs.  You feel dizzy or light-headed. Summary  After the procedure, it is common to have pain or discomfort in the incision areas.  Do not take baths, swim, or use a hot tub until your health care provider approves.  Gradually increase your activities. You may need physical therapy or cardiac rehabilitation to help strengthen your muscles and build your endurance.  Weigh yourself every day. This helps identify if your body is holding (retaining) fluid that may make your heart and lungs work  harder. This information is not intended to replace advice given to you by your health care provider. Make sure you discuss any questions you have with your health care provider. Document Released: 04/20/2005 Document Revised: 08/20/2016 Document Reviewed: 08/20/2016 Elsevier Interactive Patient Education  2018 [place].   Endoscopic Saphenous Vein Harvesting, Care After Refer to this sheet in the next few weeks. These instructions provide you with information about caring for yourself after your procedure. Your health care provider may also give you more specific instructions. Your treatment has been planned according to current medical practices, but problems sometimes occur. Call your health care provider if you have any problems or questions after your procedure. What can I expect after the procedure? After the procedure, it is common to have:  Pain.  Bruising.  Swelling.  Numbness.  Follow these instructions at home: Medicine  Take over-the-counter and prescription medicines only as told by your health care provider.  Do not drive or operate heavy machinery  while taking prescription pain medicine. Incision care   Follow instructions from your health care provider about how to take care of the cut made during surgery (incision). Make sure you: ? Wash your hands with soap and water before you change your bandage (dressing). If soap and water are not available, use hand sanitizer. ? Change your dressing as told by your health care provider. ? Leave stitches (sutures), skin glue, or adhesive strips in place. These skin closures may need to be in place for 2 weeks or longer. If adhesive strip edges start to loosen and curl up, you may trim the loose edges. Do not remove adhesive strips completely unless your health care provider tells you to do that.  Check your incision area every day for signs of infection. Check for: ? More redness, swelling, or pain. ? More fluid or  blood. ? Warmth. ? Pus or a bad smell. General instructions  Raise (elevate) your legs above the level of your heart while you are sitting or lying down.  Do any exercises your health care providers have given you. These may include deep breathing, coughing, and walking exercises.  Do not shower, take baths, swim, or use a hot tub unless told by your health care provider.  Wear your elastic stocking if told by your health care provider.  Keep all follow-up visits as told by your health care provider. This is important. Contact a health care provider if:  Medicine does not help your pain.  Your pain gets worse.  You have new leg bruises or your leg bruises get bigger.  You have a fever.  Your leg feels numb.  You have more redness, swelling, or pain around your incision.  You have more fluid or blood coming from your incision.  Your incision feels warm to the touch.  You have pus or a bad smell coming from your incision. Get help right away if:  Your pain is severe.  You develop pain, tenderness, warmth, redness, or swelling in any part of your leg.  You have chest pain.  You have trouble breathing. This information is not intended to replace advice given to you by your health care provider. Make sure you discuss any questions you have with your health care provider. Document Released: 06/13/2011 Document Revised: 03/08/2016 Document Reviewed: 08/15/2015 Elsevier Interactive Patient Education  2018 [place] on my medicine - Coumadin   (Warfarin)  This medication education was reviewed with me or my healthcare representative as part of my discharge preparation.  Why was Coumadin prescribed for you? Coumadin was prescribed for you because you have a blood clot or a medical condition that can cause an increased risk of forming blood clots. Blood clots can cause serious health problems by blocking the flow of blood to the heart, lung, or brain.  Coumadin can prevent harmful blood clots from forming. As a reminder your indication for Coumadin is:   Blood Clot Prevention After Heart Valve Surgery  What test will check on my response to Coumadin? While on Coumadin (warfarin) you will need to have an INR test regularly to ensure that your dose is keeping you in the desired range. The INR (international normalized ratio) number is calculated from the result of the laboratory test called prothrombin time (PT).  If an INR APPOINTMENT HAS NOT ALREADY BEEN MADE FOR YOU please schedule an appointment to have this lab work done by your health care provider within 7 days. Your INR goal  is usually a number between:  2 to 3 or your provider may give you a more narrow range like 2-2.5.  Ask your health care provider during an office visit what your goal INR is.  What  do you need to  know  About  COUMADIN? Take Coumadin (warfarin) exactly as prescribed by your healthcare provider about the same time each day.  DO NOT stop taking without talking to the doctor who prescribed the medication.  Stopping without other blood clot prevention medication to take the place of Coumadin may increase your risk of developing a new clot or stroke.  Get refills before you run out.  What do you do if you miss a dose? If you miss a dose, take it as soon as you remember on the same day then continue your regularly scheduled regimen the next day.  Do not take two doses of Coumadin at the same time.  Important Safety Information A possible side effect of Coumadin (Warfarin) is an increased risk of bleeding. You should call your healthcare provider right away if you experience any of the following: ? Bleeding from an injury or your nose that does not stop. ? Unusual colored urine (red or dark brown) or unusual colored stools (red or black). ? Unusual bruising for unknown reasons. ? A serious fall or if you hit your head (even if there is no bleeding).  Some foods or  medicines interact with Coumadin (warfarin) and might alter your response to warfarin. To help avoid this: ? Eat a balanced diet, maintaining a consistent amount of Vitamin K. ? Notify your provider about major diet changes you plan to make. ? Avoid alcohol or limit your intake to 1 drink for women and 2 drinks for men per day. (1 drink is 5 oz. wine, 12 oz. beer, or 1.5 oz. liquor.)  Make sure that ANY health care provider who prescribes medication for you knows that you are taking Coumadin (warfarin).  Also make sure the healthcare provider who is monitoring your Coumadin knows when you have started a new medication including herbals and non-prescription products.  Coumadin (Warfarin)  Major Drug Interactions  Increased Warfarin Effect Decreased Warfarin Effect  Alcohol (large quantities) Antibiotics (esp. Septra/Bactrim, Flagyl, Cipro) Amiodarone (Cordarone) Aspirin (ASA) Cimetidine (Tagamet) Megestrol (Megace) NSAIDs (ibuprofen, naproxen, etc.) Piroxicam (Feldene) Propafenone (Rythmol SR) Propranolol (Inderal) Isoniazid (INH) Posaconazole (Noxafil) Barbiturates (Phenobarbital) Carbamazepine (Tegretol) Chlordiazepoxide (Librium) Cholestyramine (Questran) Griseofulvin Oral Contraceptives Rifampin Sucralfate (Carafate) Vitamin K   Coumadin (Warfarin) Major Herbal Interactions  Increased Warfarin Effect Decreased Warfarin Effect  Garlic Ginseng Ginkgo biloba Coenzyme Q10 Green tea [place] Johns wort    Coumadin (Warfarin) FOOD Interactions  Eat a consistent number of servings per week of foods HIGH in Vitamin K (1 serving =  cup)  Collards (cooked, or boiled & drained) Kale (cooked, or boiled & drained) Mustard greens (cooked, or boiled & drained) Parsley *serving size only =  cup Spinach (cooked, or boiled & drained) Swiss chard (cooked, or boiled & drained) Turnip greens (cooked, or boiled & drained)  Eat a consistent number of servings per week of foods  MEDIUM-HIGH in Vitamin K (1 serving = 1 cup)  Asparagus (cooked, or boiled & drained) Broccoli (cooked, boiled & drained, or raw & chopped) Brussel sprouts (cooked, or boiled & drained) *serving size only =  cup Lettuce, raw (green leaf, endive, romaine) Spinach, raw Turnip greens, raw & chopped   These websites have more information on Coumadin (warfarin):  FailFactory.se; VeganReport.com.au;

## 2017-09-30 NOTE — Progress Notes (Addendum)
Patient came to floor from dialysis. Patient was not able to ambulate or feed himself. His speech is garbled and he is very hard to understand. His left arm (which  Has the fistula) is very swollen and hard. Called Erin, Dr. Leonarda Salon PA and notified her of patient symptoms and behavior. Junie Panning came to see patient and said that speech was garbled prior to surgery and suggested we call hemotology regarding fistula and swollen left arm. Called hemotology and they will be sending someone to assess patient's left arm.

## 2017-09-30 NOTE — Anesthesia Procedure Notes (Signed)
Central Venous Catheter Insertion Performed by: Rica Koyanagi, MD, anesthesiologist Start/End11/30/2018 8:02 AM, 09/13/2017 8:25 AM Patient location: OR. Preanesthetic checklist: patient identified, IV checked, site marked, risks and benefits discussed, surgical consent, monitors and equipment checked, pre-op evaluation and timeout performed Position: Trendelenburg Hand hygiene performed , maximum sterile barriers used  and Seldinger technique used Catheter size: 7.5 Fr Central line and PA cath was placed.Single lumen Procedure performed without using ultrasound guided technique. Attempts: 1 Following insertion, line sutured, dressing applied and Biopatch. Post procedure assessment: blood return through all ports  Patient tolerated the procedure well with no immediate complications. Additional procedure comments: Procedure performed in OR after patient anesthetized and intubated. No [place] used.

## 2017-09-30 NOTE — Progress Notes (Signed)
17 Days Post-Op Procedure(s) (LRB): CORONARY ARTERY BYPASS GRAFTING (CABG) times four LIMA  to LAD, SVG sequentially to OM and RAMUS Intermediate and SVG to Acute Marginal (N/A) TRANSESOPHAGEAL ECHOCARDIOGRAM (TEE) (N/A) MITRAL VALVE REPAIR (MVR) (N/A) Subjective: Alert and interactive Speech still slurred but better, near baseline  Objective: Vital signs in last 24 hours: Temp:  [97.5 F (36.4 C)-98.3 F (36.8 C)] 97.5 F (36.4 C) (12/17 0400) Pulse Rate:  [77-81] 78 (12/17 0600) Cardiac Rhythm: Normal sinus rhythm (12/17 0400) Resp:  [11-22] 13 (12/17 0600) BP: (117-156)/(54-96) 135/65 (12/17 0600) SpO2:  [94 %-100 %] 95 % (12/17 0600) Weight:  [220 lb 3.8 oz (99.9 kg)-223 lb 15.8 oz (101.6 kg)] 223 lb 15.8 oz (101.6 kg) (12/17 0500)  Hemodynamic parameters for last 24 hours:    Intake/Output from previous day: 12/16 0701 - 12/17 0700 In: 1975.8 [P.O.:490; I.V.:420; NG/GT:1065.8] Out: 175 [Urine:175] Intake/Output this shift: No intake/output data recorded.  General appearance: cooperative and no distress Heart: regular rate and rhythm Lungs: diminished breath sounds bibasilar Abdomen: normal findings: soft, non-tender Extremities: LUE edema Wound: clean and dry  Lab Results: Recent Labs    09/29/17 0410 09/30/17 0308  WBC 14.6* 15.3*  HGB 8.9* 8.9*  HCT 25.8* 26.3*  PLT 403* 448*   BMET:  Recent Labs    09/29/17 0410 09/30/17 0308  NA 131* 131*  K 4.5 4.6  CL 93* 91*  CO2 25 25  GLUCOSE 199* 180*  BUN 69* 91*  CREATININE 6.04* 7.33*  CALCIUM 8.7* 9.0    PT/INR:  Recent Labs    09/30/17 0308  LABPROT 21.7*  INR 1.91   ABG    Component Value Date/Time   PHART 7.427 09/15/2017 0320   HCO3 24.3 09/15/2017 0320   TCO2 26 09/14/2017 1645   ACIDBASEDEF 4.0 (H) 09/14/2017 0153   O2SAT 75.0 09/20/2017 0805   CBG (last 3)  Recent Labs    09/29/17 1119 09/29/17 1606 09/29/17 2122  GLUCAP 276* 139* 109*    Assessment/Plan: S/P  Procedure(s) (LRB): CORONARY ARTERY BYPASS GRAFTING (CABG) times four LIMA  to LAD, SVG sequentially to OM and RAMUS Intermediate and SVG to Acute Marginal (N/A) TRANSESOPHAGEAL ECHOCARDIOGRAM (TEE) (N/A) MITRAL VALVE REPAIR (MVR) (N/A) Plan for transfer to step-down: see transfer orders  CV- stable in SR  INR up to 1.9- dc IV heparin  Dc pacing wires tomorrow  RESP_ continue IS and flutter  RENAL- for HD today  ENDO- CBG intermittently elevated- continue levemir + SSI  GI/Nutrition- Po intake has improved- will dc Tf and feeding tube  Transfer to 4e step down   LOS: 22 days    Melrose Nakayama 09/30/2017

## 2017-09-30 NOTE — Progress Notes (Signed)
[facility] ROUNDING NOTE   Subjective:   Interval History:  No complaints   Recovering from CABG  12/1  MWF dialysis  [facility] MWF 4h 18mn 95.5kg 2/2 bath LUA AVG Hep none -venofer 50 q wk -calc 0.75 w HD    Objective:  Vital signs in last 24 hours:  Temp:  [97.5 F (36.4 C)-98.3 F (36.8 C)] 98.1 F (36.7 C) (12/17 0800) Pulse Rate:  [77-82] 82 (12/17 0930) Resp:  [11-22] 15 (12/17 0930) BP: (109-156)/(28-96) 109/80 (12/17 0930) SpO2:  [92 %-100 %] 97 % (12/17 0900) Weight:  [223 lb 15.8 oz (101.6 kg)-224 lb 10.4 oz (101.9 kg)] 224 lb 10.4 oz (101.9 kg) (12/17 0800)  Weight change: -13.6 oz (-2.2 kg) Filed Weights   09/29/17 0900 09/30/17 0500 09/30/17 0800  Weight: 220 lb 3.8 oz (99.9 kg) 223 lb 15.8 oz (101.6 kg) 224 lb 10.4 oz (101.9 kg)    Intake/Output: I/O last 3 completed shifts: In: 2288.3 [P.O.:490; I.V.:612.5; NG/GT:1185.8] Out: 296 [Urine:175; Emesis/NG output:120; Stool:1]   Intake/Output this shift:  Total I/O In: 75 [NG/GT:75] Out: -   PULM: clear bilat to bases CV: RRR no mrg, sternal scar intact ABD obese soft ntnd EXT: 2+ LUE edema, min other edema  LUA AVG +bruit NEURO: follows simple commands, moves all ext     Basic Metabolic Panel: Recent Labs  Lab 09/26/17 0357 09/27/17 0242 09/28/17 0255 09/29/17 0410 09/30/17 0308  NA 133* 132* 133* 131* 131*  K 3.8 3.8 4.1 4.5 4.6  CL 93* 93* 94* 93* 91*  CO2 _0 GLUCOSE 232* 193* 249* 199* 180*  BUN 40* 61* 45* 69* 91*  CREATININE 4.28* 5.75* 4.46* 6.04* 7.33*  CALCIUM 8.8* 9.0 8.4* 8.7* 9.0    Liver Function Tests: No results for input(s): AST, ALT, ALKPHOS, BILITOT, PROT, ALBUMIN in the last 168 hours. No results for input(s): LIPASE, AMYLASE in the last 168 hours. No results for input(s): AMMONIA in the last 168 hours.  CBC: Recent Labs  Lab 09/26/17 0357 09/27/17 0242 09/28/17 0255 09/29/17 0410 09/30/17 0308  WBC 22.9* 21.4* 14.9* 14.6*  15.3*  HGB 9.6* 9.3* 8.8* 8.9* 8.9*  HCT 27.0* 27.1* 26.2* 25.8* 26.3*  MCV 80.1 80.9 82.1 82.4 81.7  PLT 426* 413* 402* 403* 448*    Cardiac Enzymes: No results for input(s): CKTOTAL, CKMB, CKMBINDEX, TROPONINI in the last 168 hours.  BNP: Invalid input(s): POCBNP  CBG: Recent Labs  Lab 09/28/17 2118 09/29/17 0725 09/29/17 1119 09/29/17 1606 09/29/17 2122  GLUCAP 132* 215* 276* 139* 109*    Microbiology: Results for orders placed or performed during the hospital encounter of 09/07/17  MRSA PCR Screening     Status: None   Collection Time: 09/08/17  6:53 AM  Result Value Ref Range Status   MRSA by PCR NEGATIVE NEGATIVE Final    Comment:        The GeneXpert MRSA Assay (FDA approved for NASAL specimens only), is one component of a comprehensive MRSA colonization surveillance program. It is not intended to diagnose MRSA infection nor to guide or monitor treatment for MRSA infections.   Surgical pcr screen     Status: Abnormal   Collection Time: 09/12/17  4:39 PM  Result Value Ref Range Status   MRSA, PCR NEGATIVE NEGATIVE Final   Staphylococcus aureus POSITIVE (A) NEGATIVE Final    Comment: (NOTE) The Xpert SA Assay (FDA approved for NASAL specimens in patients 259years of age and  older), is one component of a comprehensive surveillance program. It is not intended to diagnose infection nor to guide or monitor treatment.     Coagulation Studies: Recent Labs    09/28/17 0255 09/29/17 0418 09/30/17 0308  LABPROT 16.9* 18.5* 21.7*  INR 1.39 1.55 1.91    Urinalysis: No results for input(s): COLORURINE, LABSPEC, PHURINE, GLUCOSEU, HGBUR, BILIRUBINUR, KETONESUR, PROTEINUR, UROBILINOGEN, NITRITE, LEUKOCYTESUR in the last 72 hours.  Invalid input(s): APPERANCEUR    Imaging: No results found.   Medications:   . sodium chloride    . sodium chloride    . sodium chloride    . lactated ringers 20 mL/hr at 09/13/17 1600  . magnesium sulfate     .  amiodarone  200 mg Per NG tube BID  . amitriptyline  25 mg Oral QHS  . aspirin  81 mg Oral Daily  . atorvastatin  80 mg Oral q1800  . bisacodyl  10 mg Oral Daily   Or  . bisacodyl  10 mg Rectal Daily  . chlorhexidine gluconate (MEDLINE KIT)  15 mL Mouth Rinse BID  . cinacalcet  120 mg Oral Q supper  . clopidogrel  75 mg Oral Daily  . doxercalciferol  2 mcg Intravenous Q M,W,F-HD  . Influenza vac split quadrivalent PF  0.5 mL Intramuscular Tomorrow-1000  . insulin aspart  0-5 Units Subcutaneous QHS  . insulin aspart  0-9 Units Subcutaneous TID WC  . insulin detemir  20 Units Subcutaneous BID  . levETIRAcetam  1,000 mg Per Tube Daily  . levETIRAcetam  500 mg Per Tube Q M,W,F-HD  . mouth rinse  15 mL Mouth Rinse QID  . metoprolol tartrate  25 mg Oral BID  . moving right along book   Does not apply Once  . multivitamin  1 tablet Oral QHS  . pantoprazole sodium  40 mg Per Tube Daily  . sevelamer carbonate  2,400 mg Oral TID WC  . sodium chloride flush  3 mL Intravenous Q12H  . warfarin  5 mg Oral q1800  . Warfarin - Physician Dosing Inpatient   Does not apply q1800   sodium chloride, sodium chloride, sodium chloride, alteplase, heparin, lidocaine (PF), lidocaine-prilocaine, ondansetron (ZOFRAN) IV, pentafluoroprop-tetrafluoroeth, sodium chloride flush  Assessment/ Plan:  1. CAD sp CABG: on 12/1. 2. AMS/ TME - getting better slowly 3. Nut'n - TF per NG, also getting po 4. ESRD HD MWF 5. Volume - stable 6. Anemia of CKD: Hb 9 -11 range, no esa for now 7. DM , mult complications 8. Hx prior CVA's         LOS: 22 Jenika Chiem W _0 _1 :01 AM

## 2017-09-30 NOTE — Progress Notes (Signed)
Occupational Therapy Treatment Patient Details Name: Ernest [name] MRN: [id number] DOB: [birth year] Today's Date: 09/30/2017    History of present illness Pt adm 11/24 with NSTEMI and underwent CABG x 4 and MVR on 08/18/17. Post op lethargy and confusion. PMH - Multiple CVA's, DM, HTN, chf, ESRD on HD, cad, copd   OT comments  Pt demonstrating increased lethargy and decreased responsivity this session as compared to previous OT session. Pt requiring max-total assistance for bed mobility to sit at EOB for ADL tasks and unable to maintain midline positioning without maximum hands-on facilitation this session demonstrating significant L lateral lean. Attempted sit<>stand transfer with total assistance +2 and knees blocked with Stedy frame but unable to lift hips off of bed this date. Pt additionally noted with decreased B UE strength and unable to maintain B UE flexion to hold pillow to sternum to adhere to sternal precautions. Ultimately assisted pt to chair with maximove lift. Pt with waxing and waning responsivity throughout session with moments when he seemed to stare off beyond therapist and with increased clarity at other times. Notified RN of recommendations for use of lift back to bed and changes in function. OT will continue to follow while admitted.    Follow Up Recommendations  SNF    Equipment Recommendations  3 in 1 bedside commode    Recommendations for Other Services      Precautions / Restrictions Precautions Precautions: Sternal;Fall Restrictions Other Position/Activity Restrictions: Sternal precautions       Mobility Bed Mobility Overal bed mobility: Needs Assistance Bed Mobility: Supine to Sit     Supine to sit: Total assist     General bed mobility comments: Pt able to initiate movement but unable to complete 25% of the task.   Transfers Overall transfer level: Needs assistance   Transfers: Sit to/from Stand Sit to Stand: Total assist;+2 physical  assistance         General transfer comment: Attempted sit<>stand with total assistance +2 with and without Stedy frame (for knee blocking only without pt pulling up due to sternal precautions). However, both attempts unsuccessful. Ultimately utilized maximove to assist pt to chair.     Balance Overall balance assessment: Needs assistance Sitting-balance support: Feet supported Sitting balance-Leahy Scale: Poor Sitting balance - Comments: Pt requiring max to total assistance and hands on facilitation for majority of time seated at EOB. Unable to determine midline position. Fluctuating to min assist briefly for 2 instances.  Postural control: Left lateral lean(max facilitation to correct)                                 ADL either performed or assessed with clinical judgement   ADL Overall ADL's : Needs assistance/impaired Eating/Feeding: Total assistance;Sitting   Grooming: Total assistance;Sitting Grooming Details (indicate cue type and reason): sitting with back support.                  Toilet Transfer: Total assistance           Functional mobility during ADLs: Total assistance General ADL Comments: Pt unable to maintain midline positioning seated at EOB with significant L lateral lean requiring total assistance and facilitation at L hip to maintain balance.      Vision   Additional Comments: Pt staring off beyond therapists this session at times. However, he was able to make eye contact and track intermittently.    Perception     Praxis  Cognition Arousal/Alertness: Lethargic;Awake/alert Behavior During Therapy: Flat affect Overall Cognitive Status: Impaired/Different from baseline Area of Impairment: Orientation;Attention;Following commands;Problem solving;Awareness;Safety/judgement                 Orientation Level: Disoriented to;Time;Place Current Attention Level: Sustained;Focused(waxing and waning)   Following Commands:  Follows one step commands inconsistently Safety/Judgement: Decreased awareness of safety Awareness: (pre-intellectual) Problem Solving: Slow processing;Decreased initiation;Difficulty sequencing;Requires verbal cues;Requires tactile cues General Comments: Pt reporting that it is Thursday and requiring multiple choices for determining his current location. Pt following one-step commands intermittently. Waxing and waning cognition throughout session. For example, able to state sternal precautions at beginning of session but also moments of staring off and decreased response to commands.         Exercises Exercises: Total Joint   Shoulder Instructions       General Comments L UE significant edema. Intermittent SpO2 desaturation to 80's with quick rebounds to low 90s with cueing (on RA throughout).     Pertinent Vitals/ Pain       Pain Assessment: No/denies pain Pain Location: Pt denies pain this session; however waxing and waning cognition. No grimacing or indication of distress Pain Intervention(s): Limited activity within patient's tolerance;Monitored during session;Repositioned  Home Living                                          Prior Functioning/Environment              Frequency  Min 2X/week        Progress Toward Goals  OT Goals(current goals can now be found in the care plan section)  Progress towards OT goals: Not progressing toward goals - comment(decline in functional status)  Acute Rehab OT Goals Patient Stated Goal: None stated OT Goal Formulation: With patient/family Time For Goal Achievement: 10/02/17 Potential to Achieve Goals: Good  Plan Discharge plan remains appropriate    Co-evaluation    PT/OT/SLP Co-Evaluation/Treatment: Yes Reason for Co-Treatment: Complexity of the patient's impairments (multi-system involvement);Necessary to address cognition/behavior during functional activity;For patient/therapist safety;To address  functional/ADL transfers   OT goals addressed during session: ADL's and self-care;Strengthening/ROM      AM-PAC PT "6 Clicks" Daily Activity     Outcome Measure   Help from another person eating meals?: Total Help from another person taking care of personal grooming?: Total Help from another person toileting, which includes using toliet, bedpan, or urinal?: Total Help from another person bathing (including washing, rinsing, drying)?: Total Help from another person to put on and taking off regular upper body clothing?: Total Help from another person to put on and taking off regular lower body clothing?: Total 6 Click Score: 6    End of Session    OT Visit Diagnosis: Muscle weakness (generalized) (M62.81);Cognitive communication deficit (R41.841)   Activity Tolerance Patient limited by fatigue   Patient Left in chair;with call bell/phone within reach   Nurse Communication Mobility status;Need for lift equipment        Time: 1194-1740 OT Time Calculation (min): 30 min  Charges: OT General Charges $OT Visit: 1 Visit OT Treatments $Self Care/Home Management : 8-22 mins  [name], MS OTR/L  Pager: [place] A Jerre Diguglielmo 09/30/2017, 4:44 PM

## 2017-09-30 NOTE — Progress Notes (Signed)
OT Cancellation Note  Patient Details Name: Ernest [name] MRN: [id number] DOB: [birth year]   Cancelled Treatment:    Reason Eval/Treat Not Completed: Patient at procedure or test/ unavailable. Pt off unit at HD. Will check back as able.   [name], MS OTR/L  Pager: [phone]   [name] 09/30/2017, 12:55 PM

## 2017-09-30 NOTE — Progress Notes (Signed)
Seen pt in room and re-assessed left upper arm AVG with no post HD tx infiltration noted nor any bleeding on the access sites. In fact HD charge nurse was the one cannulating the pt access sites during HD, and left arm was noted swollen prior to HD.

## 2017-10-01 ENCOUNTER — Inpatient Hospital Stay (HOSPITAL_COMMUNITY): Payer: Medicare Other

## 2017-10-01 LAB — CBC
HEMATOCRIT: 25.4 % — AB (ref 39.0–52.0)
HEMOGLOBIN: 8.6 g/dL — AB (ref 13.0–17.0)
MCH: 27.7 pg (ref 26.0–34.0)
MCHC: 33.9 g/dL (ref 30.0–36.0)
MCV: 81.7 fL (ref 78.0–100.0)
Platelets: 412 10*3/uL — ABNORMAL HIGH (ref 150–400)
RBC: 3.11 MIL/uL — ABNORMAL LOW (ref 4.22–5.81)
RDW: 15.9 % — ABNORMAL HIGH (ref 11.5–15.5)
WBC: 11.8 10*3/uL — ABNORMAL HIGH (ref 4.0–10.5)

## 2017-10-01 LAB — GLUCOSE, CAPILLARY
GLUCOSE-CAPILLARY: 143 mg/dL — AB (ref 65–99)
GLUCOSE-CAPILLARY: 109 mg/dL — AB (ref 65–99)
Glucose-Capillary: 148 mg/dL — ABNORMAL HIGH (ref 65–99)
Glucose-Capillary: 105 mg/dL — ABNORMAL HIGH (ref 65–99)

## 2017-10-01 LAB — BASIC METABOLIC PANEL
ANION GAP: 12 (ref 5–15)
BUN: 59 mg/dL — ABNORMAL HIGH (ref 6–20)
CHLORIDE: 94 mmol/L — AB (ref 101–111)
CO2: 28 mmol/L (ref 22–32)
CREATININE: 5.82 mg/dL — AB (ref 0.61–1.24)
Calcium: 8.7 mg/dL — ABNORMAL LOW (ref 8.9–10.3)
GFR calc Af Amer: 10 mL/min — ABNORMAL LOW (ref >60)
GFR calc non Af Amer: 9 mL/min — ABNORMAL LOW (ref >60)
GLUCOSE: 181 mg/dL — AB (ref 65–99)
Potassium: 4.3 mmol/L (ref 3.5–5.1)
Sodium: 134 mmol/L — ABNORMAL LOW (ref 135–145)

## 2017-10-01 LAB — PROTIME-INR
INR: 2.07
Prothrombin Time: 23.2 seconds — ABNORMAL HIGH (ref 11.4–15.2)

## 2017-10-01 MED ORDER — WARFARIN SODIUM 2.5 MG PO TABS
2.5000 mg | ORAL_TABLET | Freq: Every day | ORAL | Status: DC
  Administered 2017-10-01: 2.5 mg via ORAL
  Filled 2017-10-01: qty 1

## 2017-10-01 NOTE — Care Management Important Message (Signed)
Important Message  Patient Details  Name: Ernest [name] MRN: [id number] Date of Birth: 09/20/1946   Medicare Important Message Given:  Yes    [name] [name] 10/01/2017, 10:02 AM

## 2017-10-01 NOTE — Consult Note (Signed)
Hospital Consult    Reason for Consult:  Edema LUE Requesting Physician:  Dr. Roxan Hockey MRN #:  [id number]  History of Present Illness: This is a 71 y.o. male with PMH significant for insulin-dependent diabetes mellitus complicated by end-stage renal disease on hemodialysis, coronary artery disease, chronic systolic congestive heart failure, hepatitis C, hyperlipidemia, hypertension, obesity, diabetic retinopathy, 5 previous strokes, and gout. He is seen in consultation due to edematous LUE.  He is well known to [facility] having had multiple dialysis access surgeries including bilateral forearm grafts with most recent surgery being placement of AV graft in L arm by Dr. Scot Dock in 2014.  He completed a hemodialysis treatment this morning without complication from L arm AV graft.  On exam patient's speech is garbled and he is not able to offer any history.  In reviewing patient's chart, this clinical picture appears to be somewhat normal for him post operatively.  He is s/p CABG on 09/14/17 by Dr. Roxan Hockey, which is his reason for admission.     Past Medical History:  Diagnosis Date  . Abnormal stress test    s/p cath November 2013 with modest disease involving the ostial left main, proximal LAD, proximal RCA - do not appear to be hemodynamically signficant; mild LV dysfunction  . Bacteremia   . Chronic systolic CHF (congestive heart failure) ([place])   . COPD (chronic obstructive pulmonary disease) ([place])   . Coronary artery disease    per cath report 2013  . Ejection fraction < 50%   . Erectile dysfunction    penile implant  . ESRD (end stage renal disease) ([place])    Started HD in [place] in 2009, ESRD was due to DM. Moved to [facility] in Dec 2009 and now gets dialysis at [facility] on a MWF schedule.     . Gout   . Gout    Hx: of  . Hepatitis C   . Hyperlipidemia   . Hypertension   . Obesity   . Retinopathy   . Shortness of breath    Hx: of with exertion  . Stroke ([place])   . Type II or  unspecified type diabetes mellitus without mention of complication, not stated as uncontrolled    adult onset    Past Surgical History:  Procedure Laterality Date  . AV FISTULA PLACEMENT Left 01/13/2013   Procedure: INSERTION OF ARTERIOVENOUS (AV) GORE-TEX GRAFT ARM;  Surgeon: Angelia Mould, MD;  Location: [place];  Service: Vascular;  Laterality: Left;  . AV FISTULA PLACEMENT Left 05/05/2013   Procedure: INSERTION OF LEFT UPPER ARM  ARTERIOVENOUS GORTEX GRAFT;  Surgeon: Angelia Mould, MD;  Location: [place];  Service: Vascular;  Laterality: Left;  . [place] REMOVAL Left 03/26/2013   Procedure: REMOVAL OF  NON INCORPORATED ARTERIOVENOUS GORETEX GRAFT ([place]) left arm * repair of  left brachial artery with vein patch angioplasty.;  Surgeon: Angelia Mould, MD;  Location: [place];  Service: Vascular;  Laterality: Left;  . CARDIAC CATHETERIZATION  August 26, 2012  . CATARACT EXTRACTION W/ INTRAOCULAR LENS  IMPLANT, BILATERAL    . COLONOSCOPY    . CORONARY ARTERY BYPASS GRAFT N/A 09/13/2017   Procedure: CORONARY ARTERY BYPASS GRAFTING (CABG) times four LIMA  to LAD, SVG sequentially to OM and RAMUS Intermediate and SVG to Acute Marginal;  Surgeon: Melrose Nakayama, MD;  Location: [place];  Service: Open Heart Surgery;  Laterality: N/A;  . EMBOLECTOMY Right 08/27/2013   Procedure: EMBOLECTOMY;  Surgeon: Mal Misty,  MD;  Location: [place];  Service: Vascular;  Laterality: Right;  Thrombectomy of Radial and ulnar artery.  [name] EYE SURGERY     laser.  and surgery for DM  . fistula     RUE and wrist  . INSERTION OF DIALYSIS CATHETER Right 01/01/2013   Procedure: INSERTION OF DIALYSIS CATHETER right  internal jugular;  Surgeon: Serafina Mitchell, MD;  Location: [place];  Service: Vascular;  Laterality: Right;  . INSERTION OF DIALYSIS CATHETER N/A 03/26/2013   Procedure: INSERTION OF DIALYSIS CATHETER Left internal jugular vein;  Surgeon: Angelia Mould, MD;  Location: [place];  Service:  Vascular;  Laterality: N/A;  . LEFT HEART CATH AND CORONARY ANGIOGRAPHY N/A 09/09/2017   Procedure: LEFT HEART CATH AND CORONARY ANGIOGRAPHY;  Surgeon: Troy Sine, MD;  Location: [place] CV [facility];  Service: Cardiovascular;  Laterality: N/A;  . LIGATION OF ARTERIOVENOUS  FISTULA Right 12/30/2013   Procedure: REMOVAL OF SEGMENT OF GORTEX GRAFT AND FISTULA  AND REPAIR OF BRACHIAL ARTERY;  Surgeon: Mal Misty, MD;  Location: [place];  Service: Vascular;  Laterality: Right;  . MITRAL VALVE REPAIR N/A 09/13/2017   Procedure: MITRAL VALVE REPAIR (MVR);  Surgeon: Melrose Nakayama, MD;  Location: [place];  Service: Open Heart Surgery;  Laterality: N/A;  . PENILE PROSTHESIS IMPLANT     1997.. no card  . TEE WITHOUT CARDIOVERSION N/A 06/11/2013   Procedure: TRANSESOPHAGEAL ECHOCARDIOGRAM (TEE);  Surgeon: Larey Dresser, MD;  Location: [facility] ENDOSCOPY;  Service: Cardiovascular;  Laterality: N/A;  . TEE WITHOUT CARDIOVERSION N/A 09/13/2017   Procedure: TRANSESOPHAGEAL ECHOCARDIOGRAM (TEE);  Surgeon: Melrose Nakayama, MD;  Location: [place];  Service: Open Heart Surgery;  Laterality: N/A;  . THROMBECTOMY W/ EMBOLECTOMY Left 03/26/2013   Procedure: Attempted thrombectomy of left arm arteriovenous goretex graft.;  Surgeon: Angelia Mould, MD;  Location: [place];  Service: Vascular;  Laterality: Left;  [name] VENOGRAM N/A 04/07/2013   Procedure: VENOGRAM;  Surgeon: Serafina Mitchell, MD;  Location: [facility] C[facility] LAB;  Service: Cardiovascular;  Laterality: N/A;    [name]  Prior to Admission medications   Medication Sig Start Date End Date Taking? Authorizing Provider  allopurinol (ZYLOPRIM) 100 MG tablet Take 1 tablet (100 mg total) by mouth daily. 04/16/16  Yes Rivet, Sindy Guadeloupe, MD  amitriptyline (ELAVIL) 25 MG tablet TAKE ONE TABLET BY MOUTH AT BEDTIME FOR  NEUROPATHY 04/16/16  Yes Rivet, Carly J, MD  atorvastatin (LIPITOR) 40 MG tablet Take 1 tablet (40 mg total) by mouth daily. 11/29/15  Yes Rivet,  Sindy Guadeloupe, MD  clopidogrel (PLAVIX) 75 MG tablet Take 1 tablet (75 mg total) by mouth daily. 11/29/15  Yes Rivet, Sindy Guadeloupe, MD  colchicine 0.6 MG tablet 1 per day on Monday and Friday Patient taking differently: Take 0.3 mg by mouth See admin instructions. As directed on M and F 04/16/16  Yes Rivet, Carly J, MD  insulin glargine (LANTUS) 100 UNIT/ML injection Inject 0.1 mLs (10 Units total) into the skin at bedtime. 02/12/17  Yes Rivet, Sindy Guadeloupe, MD  levETIRAcetam (KEPPRA) 500 MG tablet Take 2 tablets (1,000 mg total) by mouth daily. Then take extra 500 mg (1 tablet) on MWF after HD 11/29/15  Yes Rivet, Carly J, MD  metoprolol tartrate (LOPRESSOR) 25 MG tablet Take 1 tablet (25 mg total) by mouth 2 (two) times daily. Patient taking differently: Take 50 mg by mouth daily.  11/29/15  Yes Rivet, Sindy Guadeloupe, MD  multivitamin (  RENA-VIT) TABS tablet Take 1 tablet by mouth daily. 11/29/15  Yes Rivet, Sindy Guadeloupe, MD  sevelamer carbonate (RENVELA) 800 MG tablet Take 1 tablet (800 mg total) by mouth 3 (three) times daily with meals. Patient taking differently: Take 1,600-3,200 mg by mouth See admin instructions. 3200mg  three times daily with meals and 1600mg  twice daily with snacks 11/29/15  Yes Rivet, Sindy Guadeloupe, MD  aspirin EC 81 MG tablet Take 1 tablet (81 mg total) by mouth daily. 11/29/15   Rivet, Sindy Guadeloupe, MD  cinacalcet (SENSIPAR) 60 MG tablet Take 1 tablet (60 mg total) by mouth 2 (two) times daily. Patient taking differently: Take 60 mg by mouth daily.  11/29/15   Rivet, Sindy Guadeloupe, MD  insulin lispro (HUMALOG) 100 UNIT/ML KiwkPen Inject 0.03 mLs (3 Units total) into the skin 3 (three) times daily with meals. 11/29/15   Rivet, Sindy Guadeloupe, MD    Social History   Socioeconomic History  . Marital status: Married    Spouse name: Not on file  . Number of children: Not on file  . Years of education: Not on file  . Highest education level: Not on file  Social Needs  . Financial resource strain: Not on file  . Food insecurity -  worry: Not on file  . Food insecurity - inability: Not on file  . Transportation needs - medical: Not on file  . Transportation needs - non-medical: Not on file  Occupational History  . Occupation: Cabin crew: DISABLED    Comment: disabled  Tobacco Use  . Smoking status: Former Smoker    Years: 7.00    Last attempt to quit: 06/10/1973    Years since quitting: 44.3  . Smokeless tobacco: Never Used  . Tobacco comment: Quit in 1974.  Substance and Sexual Activity  . Alcohol use: No    Alcohol/week: 0.0 oz  . Drug use: No  . Sexual activity: Not on file  Other Topics Concern  . Not on file  Social History Narrative   Adopted mentally retarded child at home, 2 adopted children, 3 living and 2 deceased.     Family History  Problem Relation Age of Onset  . Heart disease Father   . Diabetes Sister   . Diabetes Brother   . Diabetes Son     ROS: Otherwise negative unless mentioned in HPI  Physical Examination  Vitals:   10/01/17 0025 10/01/17 1102  BP: (!) 93/52 (!) 145/81  Pulse: 87   Resp: 17   Temp:    SpO2: 94%    Body mass index is 32.04 kg/m.  General:  WDWN in NAD Gait: Not observed HENT: WNL, normocephalic Pulmonary: normal non-labored breathing, without Rales, rhonchi,  wheezing Cardiac: regular, without  Murmurs, rubs or gallops; without carotid bruits Abdomen:  soft, NT/ND, no masses Skin: without rashes Vascular Exam/Pulses: symmetrical radial pulses; symmetrical femoral pulses Extremities: without ischemic changes, without Gangrene , without cellulitis; without open wounds; palpable thrill and audible bruit through L arm AV graft; 2+ edema L arm Musculoskeletal: no muscle wasting or atrophy  Neurologic: somnolent and lethargic with garbled speech on exam Lymph:  Unremarkable  CBC    Component Value Date/Time   WBC 11.8 (H) 10/01/2017 0424   RBC 3.11 (L) 10/01/2017 0424   HGB 8.6 (L) 10/01/2017 0424   HCT 25.4 (L) 10/01/2017 0424     PLT 412 (H) 10/01/2017 0424   MCV 81.7 10/01/2017 0424   MCH 27.7 10/01/2017 0424  MCHC 33.9 10/01/2017 0424   RDW 15.9 (H) 10/01/2017 0424   LYMPHSABS 1.2 09/08/2017 0046   MONOABS 0.8 09/08/2017 0046   EOSABS 0.0 09/08/2017 0046   BASOSABS 0.0 09/08/2017 0046    BMET    Component Value Date/Time   NA 134 (L) 10/01/2017 0424   K 4.3 10/01/2017 0424   CL 94 (L) 10/01/2017 0424   CO2 28 10/01/2017 0424   GLUCOSE 181 (H) 10/01/2017 0424   BUN 59 (H) 10/01/2017 0424   CREATININE 5.82 (H) 10/01/2017 0424   CALCIUM 8.7 (L) 10/01/2017 0424   GFRNONAA 9 (L) 10/01/2017 0424   GFRAA 10 (L) 10/01/2017 0424    COAGS: Lab Results  Component Value Date   INR 2.07 10/01/2017   INR 1.91 09/30/2017   INR 1.55 09/29/2017     Non-Invasive Vascular Imaging:   Fistula duplex pending  Statin:  Yes.   Beta Blocker:  Yes.   Aspirin:  Yes.   ACEI:  No. ARB:  No. CCB use:  No Other antiplatelets/anticoagulants:  No.    ASSESSMENT/PLAN: This is a 71 y.o. male with LUE edema with L arm AV graft; s/p CABG by Dr. Roxan Hockey 09/14/17   LUE with progressive edema over the past several days Patent L arm AV graft with palpable thrill and audible bruit Suspected infiltration of AV graft vs venous outflow obstruction Check fistula duplex Pending duplex results, patient may require fistulogram later this week Dr. Scot Dock will evaluate patient later today  Dagoberto Ligas [name] [facility] [phone]   I have interviewed the patient and examined the patient. I agree with the findings by the PA. Venous duplex of the left upper arm fistula is pending.  Pending these results he may need a fistulogram to rule out a central venous stenosis.  This could potentially be done Friday if his INR is reasonable.  INR today was 2.07.  He has been on Coumadin I presume because of his history of strokes.  This would have to be held in anticipation of a possible fistulogram.  Gae Gallop, MD [phone]

## 2017-10-01 NOTE — Progress Notes (Signed)
CSW following to facilitate discharge planning. CSW spoke with patient's wife, [name], to discuss bed offers and determine facility preference. CSW explained Medicare ratings and how to select a facility. Patient's wife selected [facility] as that would be the most convenient for her to get to. CSW will check in with facility to determine bed availability.  CSW will continue to follow.  Laveda Abbe, [place] Clinical Social Worker [phone]

## 2017-10-01 NOTE — Progress Notes (Addendum)
[address]       [place] [address]             [phone]      18 Days Post-Op Procedure(s) (LRB): CORONARY ARTERY BYPASS GRAFTING (CABG) times four LIMA  to LAD, SVG sequentially to OM and RAMUS Intermediate and SVG to Acute Marginal (N/A) TRANSESOPHAGEAL ECHOCARDIOGRAM (TEE) (N/A) MITRAL VALVE REPAIR (MVR) (N/A) Subjective: In no distress this morning, just back from radiology  Objective: Vital signs in last 24 hours: Temp:  [97.7 F (36.5 C)] 97.7 F (36.5 C) (12/17 1245) Pulse Rate:  [78-87] 87 (12/18 0025) Cardiac Rhythm: Normal sinus rhythm (12/17 1950) Resp:  [13-18] 17 (12/18 0025) BP: (93-147)/(28-80) 93/52 (12/18 0025) SpO2:  [94 %-100 %] 94 % (12/18 0025) Weight:  [216 lb 14.9 oz (98.4 kg)-217 lb 6 oz (98.6 kg)] 216 lb 14.9 oz (98.4 kg) (12/18 0657)  Hemodynamic parameters for last 24 hours:    Intake/Output from previous day: 12/17 0701 - 12/18 0700 In: 75 [NG/GT:75] Out: 2500  Intake/Output this shift: No intake/output data recorded.  General appearance: alert, distracted and no distress Heart: regular rate and rhythm Lungs: dim L>R base, otherwise clear Abdomen: benign Extremities: no edema Wound: incis healing well  Lab Results: Recent Labs    09/30/17 0308 10/01/17 0424  WBC 15.3* 11.8*  HGB 8.9* 8.6*  HCT 26.3* 25.4*  PLT 448* 412*   BMET:  Recent Labs    09/30/17 0308 10/01/17 0424  NA 131* 134*  K 4.6 4.3  CL 91* 94*  CO2 25 28  GLUCOSE 180* 181*  BUN 91* 59*  CREATININE 7.33* 5.82*  CALCIUM 9.0 8.7*    PT/INR:  Recent Labs    10/01/17 0424  LABPROT 23.2*  INR 2.07   ABG    Component Value Date/Time   PHART 7.427 09/15/2017 0320   HCO3 24.3 09/15/2017 0320   TCO2 26 09/14/2017 1645   ACIDBASEDEF 4.0 (H) 09/14/2017 0153   O2SAT 75.0 09/20/2017 0805   CBG (last 3)  Recent Labs    09/30/17 1731 09/30/17 2109 10/01/17 0634  GLUCAP 198* 214* 148*    Meds Scheduled Meds: . amiodarone   200 mg Per NG tube BID  . amitriptyline  25 mg Oral QHS  . aspirin  81 mg Oral Daily  . atorvastatin  80 mg Oral q1800  . bisacodyl  10 mg Oral Daily   Or  . bisacodyl  10 mg Rectal Daily  . chlorhexidine gluconate (MEDLINE KIT)  15 mL Mouth Rinse BID  . cinacalcet  120 mg Oral Q supper  . clopidogrel  75 mg Oral Daily  . doxercalciferol  2 mcg Intravenous Q M,W,F-HD  . feeding supplement (NEPRO CARB STEADY)  237 mL Oral BID BM  . Influenza vac split quadrivalent PF  0.5 mL Intramuscular Tomorrow-1000  . insulin aspart  0-5 Units Subcutaneous QHS  . insulin aspart  0-9 Units Subcutaneous TID WC  . insulin detemir  20 Units Subcutaneous BID  . levETIRAcetam  1,000 mg Per Tube Daily  . levETIRAcetam  500 mg Per Tube Q M,W,F-HD  . mouth rinse  15 mL Mouth Rinse QID  . metoprolol tartrate  25 mg Oral BID  . moving right along book   Does not apply Once  . multivitamin  1 tablet Oral QHS  . pantoprazole sodium  40 mg Per Tube Daily  . sevelamer carbonate  2,400 mg Oral TID WC  .  sodium chloride flush  3 mL Intravenous Q12H  . warfarin  5 mg Oral q1800  . Warfarin - Physician Dosing Inpatient   Does not apply q1800   Continuous Infusions: . sodium chloride    . lactated ringers 20 mL/hr at 09/13/17 1600  . magnesium sulfate     PRN Meds:.sodium chloride, ondansetron (ZOFRAN) IV, RESOURCE THICKENUP CLEAR, sodium chloride flush  Xrays No results found.  Assessment/Plan: S/P Procedure(s) (LRB): CORONARY ARTERY BYPASS GRAFTING (CABG) times four LIMA  to LAD, SVG sequentially to OM and RAMUS Intermediate and SVG to Acute Marginal (N/A) TRANSESOPHAGEAL ECHOCARDIOGRAM (TEE) (N/A) MITRAL VALVE REPAIR (MVR) (N/A)  1 hemodyn stable in sinus rhythm, afebrile, sats good on RA 2 sugars elevated, monitor, may need further insulin adjustments, cont SSI as well 3 H/H stable, improving leukocytosis 4 nephrology managing dialysis 5 TF's stopped yesterday 6 decrease coumadin to 2.5 and d/c  pacer wires 7 push rehab and pulm toilet as able  LOS: 23 days    Ernest [name] 10/01/2017 Patient seen and examined, agree with above More alert this morning, speech at baseline Will ask Vascular to see re: LUE edema- ? Whether he may need subclavian vein imaged DC pacing wires  Remo Lipps C. Roxan Hockey, MD [facility] [phone]

## 2017-10-01 NOTE — Progress Notes (Signed)
Nutrition Follow-up  DOCUMENTATION CODES:   Obesity unspecified  INTERVENTION:   Nepro Shake po BID, each supplement provides 425 kcal and 19 grams protein  Magic cup BID with meals, each supplement provides 290 kcal and 9 grams of protein  NUTRITION DIAGNOSIS:   Increased nutrient needs related to acute illness, chronic illness(CABG, ESRD on HD) as evidenced by estimated needs.  Continues but being addressed via supplements  GOAL:   Patient will meet greater than or equal to 90% of their needs  Progressing  MONITOR:   TF tolerance, Labs, Weight trends, I & O's, Skin  REASON FOR ASSESSMENT:   Consult Enteral/tube feeding initiation and management  ASSESSMENT:   71 yo male admitted NSTEMI, severe CAD s/p CABG on 12/1. Pt with hx of CHF, ESRD on HD, DM, gout, COPD, CAD, CVA with residual weakness  11/30 CABG x 4 12/7 Cortrak inserted and Nepro TF started 12/17 Cortrak tube removed, TF discontinued, last HD ( HD on MWF)   Pt lethargic but arousable. Pt verbalizes but speech is difficult to understand Pt currently on Dysphagia II diet, pt requires feeding assistance. Per CNA, pt ate 100% at breakfast this AM, pt getting ready to eat lunch. Recorded po intake 57% on average  EDW 95.5 , current wt of 98.4 kg. Weight up and down since admission but current weight same as weight on admission. Pt weighed 98.4 kg on 11/25, up to 109.9 kg post CABG.   LUE very edematous, progressive over the past several days, pt may require fistulagram  Labs: sodium 134, CBGs 143-276, sodium 134 Meds:  renvela, Rena-Vit  Diet Order:  DIET DYS 2 Room service appropriate? Yes; Fluid consistency: Thin  EDUCATION NEEDS:   No education needs have been identified at this time  Skin:  Skin Assessment: Reviewed RN Assessment(no pressure ulcers noted) Skin Integrity Issues:: Incisions Incisions: mid sternum, leg  Last BM:  12/17  Height:   Ht Readings from Last 1 Encounters:  09/09/17  5\' 9"  (1.753 m)    Weight:   Wt Readings from Last 1 Encounters:  10/01/17 216 lb 14.9 oz (98.4 kg)    Ideal Body Weight:  72.7 kg  BMI:  Body mass index is 32.04 kg/m.  Estimated Nutritional Needs:   Kcal:  2200-2500 kcals  Protein:  125-150 g  Fluid:  1000 mL plus UOP  [employer] MS, RD, LDN, CNSC [phone] Pager  [phone] Weekend/On-Call Pager

## 2017-10-01 NOTE — Progress Notes (Signed)
[facility] ROUNDING NOTE   Subjective:   Interval History:  No complaints   Recovering from CABG  12/1  MWF dialysis   No complaints today [facility] MWF 4h 18mn 95.5kg 2/2 bath LUA AVG Hep none -venofer 50 q wk -calc 0.75 w HD    Objective:  Vital signs in last 24 hours:  Temp:  [97.7 F (36.5 C)] 97.7 F (36.5 C) (12/17 1245) Pulse Rate:  [81-87] 87 (12/18 0025) Resp:  [15-18] 17 (12/18 0025) BP: (93-145)/(49-81) 145/81 (12/18 1102) SpO2:  [94 %-100 %] 94 % (12/18 0025) Weight:  [216 lb 14.9 oz (98.4 kg)-217 lb 6 oz (98.6 kg)] 216 lb 14.9 oz (98.4 kg) (12/18 0657)  Weight change: 2 lb 10.3 oz (1.2 kg) Filed Weights   09/30/17 0800 09/30/17 1245 10/01/17 0657  Weight: 222 lb 14.2 oz (101.1 kg) 217 lb 6 oz (98.6 kg) 216 lb 14.9 oz (98.4 kg)    Intake/Output: I/O last 3 completed shifts: In: 761.7 [I.V.:192.5; NG/GT:569.2] Out: 2675 [Urine:175; Other:2500]   Intake/Output this shift:  No intake/output data recorded.  PULM: clear bilat to bases CV: RRR no mrg, sternal scar intact ABD obese soft ntnd EXT: 2+ LUE edema, min other edema  LUA AVG +bruit NEURO: follows simple commands, moves all ext     Basic Metabolic Panel: Recent Labs  Lab 09/27/17 0242 09/28/17 0255 09/29/17 0410 09/30/17 0308 10/01/17 0424  NA 132* 133* 131* 131* 134*  K 3.8 4.1 4.5 4.6 4.3  CL 93* 94* 93* 91* 94*  CO2 _0 GLUCOSE 193* 249* 199* 180* 181*  BUN 61* 45* 69* 91* 59*  CREATININE 5.75* 4.46* 6.04* 7.33* 5.82*  CALCIUM 9.0 8.4* 8.7* 9.0 8.7*    Liver Function Tests: No results for input(s): AST, ALT, ALKPHOS, BILITOT, PROT, ALBUMIN in the last 168 hours. No results for input(s): LIPASE, AMYLASE in the last 168 hours. No results for input(s): AMMONIA in the last 168 hours.  CBC: Recent Labs  Lab 09/27/17 0242 09/28/17 0255 09/29/17 0410 09/30/17 0308 10/01/17 0424  WBC 21.4* 14.9* 14.6* 15.3* 11.8*  HGB 9.3* 8.8* 8.9* 8.9* 8.6*  HCT  27.1* 26.2* 25.8* 26.3* 25.4*  MCV 80.9 82.1 82.4 81.7 81.7  PLT 413* 402* 403* 448* 412*    Cardiac Enzymes: No results for input(s): CKTOTAL, CKMB, CKMBINDEX, TROPONINI in the last 168 hours.  BNP: Invalid input(s): POCBNP  CBG: Recent Labs  Lab 09/29/17 1606 09/29/17 2122 09/30/17 1731 09/30/17 2109 10/01/17 0634  GLUCAP 139* 109* 198* 214* 148*    Microbiology: Results for orders placed or performed during the hospital encounter of 09/07/17  MRSA PCR Screening     Status: None   Collection Time: 09/08/17  6:53 AM  Result Value Ref Range Status   MRSA by PCR NEGATIVE NEGATIVE Final    Comment:        The GeneXpert MRSA Assay (FDA approved for NASAL specimens only), is one component of a comprehensive MRSA colonization surveillance program. It is not intended to diagnose MRSA infection nor to guide or monitor treatment for MRSA infections.   Surgical pcr screen     Status: Abnormal   Collection Time: 09/12/17  4:39 PM  Result Value Ref Range Status   MRSA, PCR NEGATIVE NEGATIVE Final   Staphylococcus aureus POSITIVE (A) NEGATIVE Final    Comment: (NOTE) The Xpert SA Assay (FDA approved for NASAL specimens in patients 265years of age and older), is one component  of a comprehensive surveillance program. It is not intended to diagnose infection nor to guide or monitor treatment.     Coagulation Studies: Recent Labs    09/29/17 0418 09/30/17 0308 10/01/17 0424  LABPROT 18.5* 21.7* 23.2*  INR 1.55 1.91 2.07    Urinalysis: No results for input(s): COLORURINE, LABSPEC, PHURINE, GLUCOSEU, HGBUR, BILIRUBINUR, KETONESUR, PROTEINUR, UROBILINOGEN, NITRITE, LEUKOCYTESUR in the last 72 hours.  Invalid input(s): APPERANCEUR    Imaging: Dg Chest 2 View  Result Date: 10/01/2017 CLINICAL DATA:  71 year old male postoperative day 17 status post CABG. EXAM: CHEST  2 VIEW COMPARISON:  09/25/2017 and earlier. FINDINGS: Semi upright AP and lateral views of the  chest. Enteric tube removed. Low lung volumes. Moderate confluent opacity at the left lung base likely in part due to pleural effusion. Pulmonary vascular congestion. No pneumothorax. No confluent opacity in the right lung. Stable cardiomegaly and mediastinal contours. Sequelae of CABG and mitral valve repair. Visualized tracheal air column is within normal limits. No acute osseous abnormality identified. Negative visible bowel gas pattern. IMPRESSION: 1. Low lung volumes with pulmonary vascular congestion. 2. Moderate left lung base opacity, probably a combination of pleural effusion and airspace disease. Electronically Signed   By: Genevie Ann M.D.   On: 10/01/2017 09:50     Medications:   . sodium chloride    . lactated ringers 20 mL/hr at 09/13/17 1600  . magnesium sulfate     . amiodarone  200 mg Per NG tube BID  . amitriptyline  25 mg Oral QHS  . aspirin  81 mg Oral Daily  . atorvastatin  80 mg Oral q1800  . bisacodyl  10 mg Oral Daily   Or  . bisacodyl  10 mg Rectal Daily  . chlorhexidine gluconate (MEDLINE KIT)  15 mL Mouth Rinse BID  . cinacalcet  120 mg Oral Q supper  . clopidogrel  75 mg Oral Daily  . doxercalciferol  2 mcg Intravenous Q M,W,F-HD  . feeding supplement (NEPRO CARB STEADY)  237 mL Oral BID BM  . Influenza vac split quadrivalent PF  0.5 mL Intramuscular Tomorrow-1000  . insulin aspart  0-5 Units Subcutaneous QHS  . insulin aspart  0-9 Units Subcutaneous TID WC  . insulin detemir  20 Units Subcutaneous BID  . levETIRAcetam  1,000 mg Per Tube Daily  . levETIRAcetam  500 mg Per Tube Q M,W,F-HD  . mouth rinse  15 mL Mouth Rinse QID  . metoprolol tartrate  25 mg Oral BID  . moving right along book   Does not apply Once  . multivitamin  1 tablet Oral QHS  . pantoprazole sodium  40 mg Per Tube Daily  . sevelamer carbonate  2,400 mg Oral TID WC  . sodium chloride flush  3 mL Intravenous Q12H  . warfarin  2.5 mg Oral q1800  . Warfarin - Physician Dosing Inpatient    Does not apply q1800   sodium chloride, ondansetron (ZOFRAN) IV, RESOURCE THICKENUP CLEAR, sodium chloride flush  Assessment/ Plan:  1. CAD sp CABG: on 12/1. 2. AMS/ TME - getting betterslowly 3. Nut'n - TF per NG, also getting po 4. ESRD HD MWF 5. Volume - stable 6. Anemia of CKD: Hb 9 -11 range, no esa for now 7. DM , mult complications 8. Hx prior CVA's          LOS: 23 [address] Wyse W _0 _1 :30 AM

## 2017-10-01 NOTE — Progress Notes (Signed)
Thank you for consult on Ernest [name]. Chart and therapy notes reviewed. He is limited by poor activity tolerance as well as fluctuating bouts of lethargy.  He does not demonstrate ability to tolerate intensive rehab program.  Agree with recommendations of SNF for  follow up therapy.

## 2017-10-01 NOTE — Progress Notes (Signed)
Pulled patient pacing wires per order. Wires were intact and there was not bleeding at the site. Patient VS are stable and patient tolerated removal of wires well.

## 2017-10-02 ENCOUNTER — Inpatient Hospital Stay (HOSPITAL_COMMUNITY): Payer: Medicare Other

## 2017-10-02 DIAGNOSIS — R609 Edema, unspecified: Secondary | ICD-10-CM

## 2017-10-02 LAB — RENAL FUNCTION PANEL
Albumin: 2.2 g/dL — ABNORMAL LOW (ref 3.5–5.0)
Anion gap: 15 (ref 5–15)
BUN: 77 mg/dL — ABNORMAL HIGH (ref 6–20)
CALCIUM: 8.5 mg/dL — AB (ref 8.9–10.3)
CO2: 25 mmol/L (ref 22–32)
CREATININE: 7.65 mg/dL — AB (ref 0.61–1.24)
Chloride: 93 mmol/L — ABNORMAL LOW (ref 101–111)
GFR, EST AFRICAN AMERICAN: 7 mL/min — AB (ref >60)
GFR, EST NON AFRICAN AMERICAN: 6 mL/min — AB (ref >60)
Glucose, Bld: 122 mg/dL — ABNORMAL HIGH (ref 65–99)
Phosphorus: 5.8 mg/dL — ABNORMAL HIGH (ref 2.5–4.6)
Potassium: 4.3 mmol/L (ref 3.5–5.1)
SODIUM: 133 mmol/L — AB (ref 135–145)

## 2017-10-02 LAB — CBC
HCT: 24.8 % — ABNORMAL LOW (ref 39.0–52.0)
Hemoglobin: 8.3 g/dL — ABNORMAL LOW (ref 13.0–17.0)
MCH: 27.7 pg (ref 26.0–34.0)
MCHC: 33.5 g/dL (ref 30.0–36.0)
MCV: 82.7 fL (ref 78.0–100.0)
PLATELETS: 413 10*3/uL — AB (ref 150–400)
RBC: 3 MIL/uL — AB (ref 4.22–5.81)
RDW: 16.2 % — ABNORMAL HIGH (ref 11.5–15.5)
WBC: 10.6 10*3/uL — AB (ref 4.0–10.5)

## 2017-10-02 LAB — GLUCOSE, CAPILLARY
GLUCOSE-CAPILLARY: 149 mg/dL — AB (ref 65–99)
GLUCOSE-CAPILLARY: 152 mg/dL — AB (ref 65–99)
GLUCOSE-CAPILLARY: 62 mg/dL — AB (ref 65–99)
Glucose-Capillary: 106 mg/dL — ABNORMAL HIGH (ref 65–99)
Glucose-Capillary: 108 mg/dL — ABNORMAL HIGH (ref 65–99)

## 2017-10-02 LAB — PROTIME-INR
INR: 2.54
PROTHROMBIN TIME: 27.2 s — AB (ref 11.4–15.2)

## 2017-10-02 MED ORDER — PENTAFLUOROPROP-TETRAFLUOROETH EX AERO: 1.0000 "application " | INHALATION_SPRAY | CUTANEOUS | Status: DC | PRN

## 2017-10-02 MED ORDER — ALTEPLASE 2 MG IJ SOLR: 2.0000 mg | Freq: Once | INTRAMUSCULAR | Status: DC | PRN

## 2017-10-02 MED ORDER — HEPARIN SODIUM (PORCINE) 1000 UNIT/ML DIALYSIS: 1000.0000 [IU] | INTRAMUSCULAR | Status: DC | PRN

## 2017-10-02 MED ORDER — DOXERCALCIFEROL 4 MCG/2ML IV SOLN
INTRAVENOUS | Status: AC
  Administered 2017-10-02: 2 ug via INTRAVENOUS
  Filled 2017-10-02: qty 2

## 2017-10-02 MED ORDER — LIDOCAINE-PRILOCAINE 2.5-2.5 % EX CREA: 1.0000 "application " | TOPICAL_CREAM | CUTANEOUS | Status: DC | PRN

## 2017-10-02 MED ORDER — SODIUM CHLORIDE 0.9 % IV SOLN: 100.0000 mL | INTRAVENOUS | Status: DC | PRN

## 2017-10-02 MED ORDER — LIDOCAINE HCL (PF) 1 % IJ SOLN: 5.0000 mL | INTRAMUSCULAR | Status: DC | PRN

## 2017-10-02 NOTE — Progress Notes (Addendum)
Preliminary results by tech - Left Arm AV graft is patent with mildly increased velocities at the proximal segment without significant narrowing/plaque visualized. There is moderate amount of edema noted.  Oda Cogan, BS, RDMS, RVT

## 2017-10-02 NOTE — Progress Notes (Signed)
  Speech Language Pathology Treatment: Dysphagia  Patient Details Name: Ernest [name] MRN: [id number] DOB: 1946/05/29 Today's Date: 10/02/2017 Time: 6644-0347 SLP Time Calculation (min) (ACUTE ONLY): 21 min  Assessment / Plan / Recommendation Clinical Impression  RN reported pt coughing when wife gave him orange juice with a straw in mostly supine position. Thickener observed on table however thick liquids had not been recommended for pt and could not locate documentation of pt having difficulty with thin. Pt needs assist to scoop food on utensil, pt can bring to oral cavity and needs assist for precision to mouth. Tech feeding pt when SLP arrived and wife stating "he can't feed himself". Explained benefits and safety of allowing pt to self feeding as much as he is able. Reinterated upright positioning with all po's. Pt's vocal quality wet throughout session clearing with cues for volitional throat clear. Pt is at increased aspiration risk given prior stroke and deconditioned state. Continue Dys 2 texture, thin liquids, swallow precautions and ST.    HPI HPI: Pt adm 11/24 with NSTEMI and underwent CABG x 4 and MVR on 08/18/17. Intubated 11/30-12/1. Post op lethargy and confusion, stroke suspected but CT found No acute intracranial process, stable mild-to-moderate chronic small vessel ischemic disease and old small cerebellar infarcts. CXR 12/12 No acute intracranial process, stable mild parenchymal brain volume loss, stable mild-to-moderate chronic small vessel ischemic disease and old small cerebellar infarcts. BSE 06/2015 functional swallow, regular/thin recommended. PMH - Multiple CVA's, DM, HTN, chf, ESRD on HD, cad, copd.      SLP Plan  Continue with current plan of care       Recommendations  Diet recommendations: Dysphagia 2 (fine chop);Thin liquid Liquids provided via: Cup;Straw Medication Administration: Crushed with puree Supervision: Staff to assist with self feeding;Full  supervision/cueing for compensatory strategies Compensations: Minimize environmental distractions;Slow rate;Small sips/bites Postural Changes and/or Swallow Maneuvers: Seated upright 90 degrees                Oral Care Recommendations: Oral care BID Follow up Recommendations: Skilled Nursing facility SLP Visit Diagnosis: Dysphagia, unspecified (R13.10) Plan: Continue with current plan of care                      Houston Siren 10/02/2017, 2:32 PM   Orbie Pyo Colvin Caroli.Ed [employer] [phone]

## 2017-10-02 NOTE — Procedures (Signed)
I have seen and examined this patient and agree with the plan of care   Seen on dialysis  Reviewed all labs and meds  CAD  S/p CABG  12/1 AMS Diabetes  Multiple CVA  No issues on dialysis  [facility] W 10/02/2017, 8:18 AM

## 2017-10-02 NOTE — Progress Notes (Signed)
ANTICOAGULATION CONSULT NOTE - Initial Consult  Pharmacy Consult for Lovenox Indication: atrial fibrillation  [name]  Patient Measurements: Height: _0  (175.3 cm) Weight: 219 lb 5.7 oz (99.5 kg) IBW/kg (Calculated) : 70.7  Vital Signs: Temp: 97.9 F (36.6 C) (12/19 0400) Temp Source: Oral (12/19 0400) BP: 149/62 (12/19 0400) Pulse Rate: 76 (12/19 0400)  Labs: Recent Labs    09/29/17 0804 09/30/17 0308 10/01/17 0424 10/02/17 0234  HGB  --  8.9* 8.6*  --   HCT  --  26.3* 25.4*  --   PLT  --  448* 412*  --   LABPROT  --  21.7* 23.2* 27.2*  INR  --  1.91 2.07 2.54  HEPARINUNFRC 0.53 0.79*  --   --   CREATININE  --  7.33* 5.82*  --     Estimated Creatinine Clearance: 13.9 mL/min (A) (by C-G formula based on SCr of 5.82 mg/dL (H)).   Medical History: Past Medical History:  Diagnosis Date  . Abnormal stress test    s/p cath November 2013 with modest disease involving the ostial left main, proximal LAD, proximal RCA - do not appear to be hemodynamically signficant; mild LV dysfunction  . Bacteremia   . Chronic systolic CHF (congestive heart failure) ([place])   . COPD (chronic obstructive pulmonary disease) ([place])   . Coronary artery disease    per cath report 2013  . Ejection fraction < 50%   . Erectile dysfunction    penile implant  . ESRD (end stage renal disease) ([place])    Started HD in [place] in 2009, ESRD was due to DM. Moved to [facility] in Dec 2009 and now gets dialysis at [facility] on a MWF schedule.     . Gout   . Gout    Hx: of  . Hepatitis C   . Hyperlipidemia   . Hypertension   . Obesity   . Retinopathy   . Shortness of breath    Hx: of with exertion  . Stroke ([place])   . Type II or unspecified type diabetes mellitus without mention of complication, not stated as uncontrolled    adult onset    Medications:  Medications Prior to Admission  Medication Sig Dispense Refill Last Dose  . allopurinol (ZYLOPRIM) 100 MG tablet Take 1 tablet (100 mg  total) by mouth daily. 30 tablet 2   . amitriptyline (ELAVIL) 25 MG tablet TAKE ONE TABLET BY MOUTH AT BEDTIME FOR  NEUROPATHY 90 tablet 1 Taking  . atorvastatin (LIPITOR) 40 MG tablet Take 1 tablet (40 mg total) by mouth daily. 30 tablet 11 Taking  . clopidogrel (PLAVIX) 75 MG tablet Take 1 tablet (75 mg total) by mouth daily. 30 tablet 11 Taking  . colchicine 0.6 MG tablet 1 per day on Monday and Friday (Patient taking differently: Take 0.3 mg by mouth See admin instructions. As directed on M and F) 30 tablet 2 Taking  . insulin glargine (LANTUS) 100 UNIT/ML injection Inject 0.1 mLs (10 Units total) into the skin at bedtime. 10 mL 1 Taking  . levETIRAcetam (KEPPRA) 500 MG tablet Take 2 tablets (1,000 mg total) by mouth daily. Then take extra 500 mg (1 tablet) on MWF after HD 72 tablet 5 Taking  . metoprolol tartrate (LOPRESSOR) 25 MG tablet Take 1 tablet (25 mg total) by mouth 2 (two) times daily. (Patient taking differently: Take 50 mg by mouth daily. ) 60 tablet 5 Taking  . multivitamin (RENA-VIT) TABS tablet Take 1  tablet by mouth daily. 30 tablet 11 Taking  . sevelamer carbonate (RENVELA) 800 MG tablet Take 1 tablet (800 mg total) by mouth 3 (three) times daily with meals. (Patient taking differently: Take 1,600-3,200 mg by mouth See admin instructions. 3222m three times daily with meals and 16028mtwice daily with snacks) 90 tablet 1 Taking  . aspirin EC 81 MG tablet Take 1 tablet (81 mg total) by mouth daily. 30 tablet 11  [facility]. cinacalcet (SENSIPAR) 60 MG tablet Take 1 tablet (60 mg total) by mouth 2 (two) times daily. (Patient taking differently: Take 60 mg by mouth daily. ) 60 tablet 5  [facility] insulin lispro (HUMALOG) 100 UNIT/ML KiwkPen Inject 0.03 mLs (3 Units total) into the skin 3 (three) times daily with meals. 15 mL 5  [facility] Scheduled:  . amiodarone  200 mg Per NG tube BID  . amitriptyline  25 mg Oral QHS  . aspirin  81 mg Oral Daily  . atorvastatin  80 mg Oral q1800  .  bisacodyl  10 mg Oral Daily   Or  . bisacodyl  10 mg Rectal Daily  . chlorhexidine gluconate (MEDLINE KIT)  15 mL Mouth Rinse BID  . cinacalcet  120 mg Oral Q supper  . clopidogrel  75 mg Oral Daily  . doxercalciferol  2 mcg Intravenous Q M,W,F-HD  . feeding supplement (NEPRO CARB STEADY)  237 mL Oral BID BM  . Influenza vac split quadrivalent PF  0.5 mL Intramuscular Tomorrow-1000  . insulin aspart  0-5 Units Subcutaneous QHS  . insulin aspart  0-9 Units Subcutaneous TID WC  . insulin detemir  20 Units Subcutaneous BID  . levETIRAcetam  1,000 mg Per Tube Daily  . levETIRAcetam  500 mg Per Tube Q M,W,F-HD  . mouth rinse  15 mL Mouth Rinse QID  . metoprolol tartrate  25 mg Oral BID  . moving right along book   Does not apply Once  . multivitamin  1 tablet Oral QHS  . pantoprazole sodium  40 mg Per Tube Daily  . sevelamer carbonate  2,400 mg Oral TID WC  . sodium chloride flush  3 mL Intravenous Q12H  . Warfarin - Physician Dosing Inpatient   Does not apply q1800   Infusions:  . sodium chloride    . lactated ringers 20 mL/hr at 09/13/17 1600  . magnesium sulfate      Assessment: [age]yoale holding Coumadin in anticipation of fistulogram; INR currently at goal, to start LMWH when INR below goal.  Goal of Therapy:  Anti-Xa level 0.6-1 units/ml 4hrs after LMWH dose given Monitor platelets by anticoagulation protocol: Yes   Plan:  Will monitor INR and start parenteral anticoagulation when <2; consider changing LMWH to UFH in this HD pt.  VeWynona NeatPharmD, BCPS  10/02/2017,6:35 AM

## 2017-10-02 NOTE — Progress Notes (Addendum)
      [address]       [place] [address]             [phone]      19 Days Post-Op Procedure(s) (LRB): CORONARY ARTERY BYPASS GRAFTING (CABG) times four LIMA  to LAD, SVG sequentially to OM and RAMUS Intermediate and SVG to Acute Marginal (N/A) TRANSESOPHAGEAL ECHOCARDIOGRAM (TEE) (N/A) MITRAL VALVE REPAIR (MVR) (N/A)   Subjective:  No specific complaints.  Undergoing dialysis this morning.  Objective: Vital signs in last 24 hours: Temp:  [97.9 F (36.6 C)-98.2 F (36.8 C)] 97.9 F (36.6 C) (12/19 0700) Pulse Rate:  [75-79] 76 (12/19 1030) Cardiac Rhythm: Atrial flutter (12/19 0700) Resp:  [10-18] 18 (12/19 0700) BP: (84-149)/(26-86) 114/56 (12/19 1030) SpO2:  [97 %-100 %] 100 % (12/19 0700) Weight:  [217 lb 13 oz (98.8 kg)-219 lb 5.7 oz (99.5 kg)] 217 lb 13 oz (98.8 kg) (12/19 0700)  Intake/Output from previous day: 12/18 0701 - 12/19 0700 In: 600 [P.O.:600] Out: -   General appearance: alert, cooperative and no distress Heart: regular rate and rhythm Lungs: diminished breath sounds on left Abdomen: soft, non-tender; bowel sounds normal; no masses,  no organomegaly Extremities: extremities normal, atraumatic, no cyanosis or edema Wound: clean and dry  Lab Results: Recent Labs    10/01/17 0424 10/02/17 0730  WBC 11.8* 10.6*  HGB 8.6* 8.3*  HCT 25.4* 24.8*  PLT 412* 413*   BMET:  Recent Labs    10/01/17 0424 10/02/17 0718  NA 134* 133*  K 4.3 4.3  CL 94* 93*  CO2 28 25  GLUCOSE 181* 122*  BUN 59* 77*  CREATININE 5.82* 7.65*  CALCIUM 8.7* 8.5*    PT/INR:  Recent Labs    10/02/17 0234  LABPROT 27.2*  INR 2.54   ABG    Component Value Date/Time   PHART 7.427 09/15/2017 0320   HCO3 24.3 09/15/2017 0320   TCO2 26 09/14/2017 1645   ACIDBASEDEF 4.0 (H) 09/14/2017 0153   O2SAT 75.0 09/20/2017 0805   CBG (last 3)  Recent Labs    10/01/17 1651 10/01/17 2123 10/02/17 0601  GLUCAP 109* 105* 149*     Assessment/Plan: S/P Procedure(s) (LRB): CORONARY ARTERY BYPASS GRAFTING (CABG) times four LIMA  to LAD, SVG sequentially to OM and RAMUS Intermediate and SVG to Acute Marginal (N/A) TRANSESOPHAGEAL ECHOCARDIOGRAM (TEE) (N/A) MITRAL VALVE REPAIR (MVR) (N/A)  1. CV- NSR, remains hemodynamically stable, continue Amiodarone, Lopressor 2. Pulm- no acute issues 3. Renal- ESRD, nephrology following... Vascular consult obtained for LUE edema, ordered duplex result is pending 4. DM-  Sugars controlled 5. Deconditioning- stroke in the past, continue PT/OT will need rehab at discharge 6. Dispo- patient stable, continue current care, vascular workup pending   LOS: 24 days    Ellwood Handler 10/02/2017 Patient seen and examined, agree with above Mental status about the same Appreciate DR. Dickson's assistance  Revonda Standard. Roxan Hockey, MD [facility] [phone]

## 2017-10-02 NOTE — Progress Notes (Addendum)
VASCULAR SURGERY:  His duplex of his fistula is pending.  We tentatively plan a fistulogram on Friday if his INR is acceptable.  INR today was 2.54.  I have held his Coumadin.  Deitra Mayo, MD, [place] [phone] Office: [phone]

## 2017-10-03 LAB — GLUCOSE, CAPILLARY
GLUCOSE-CAPILLARY: 94 mg/dL (ref 65–99)
GLUCOSE-CAPILLARY: 99 mg/dL (ref 65–99)
Glucose-Capillary: 114 mg/dL — ABNORMAL HIGH (ref 65–99)
Glucose-Capillary: 131 mg/dL — ABNORMAL HIGH (ref 65–99)

## 2017-10-03 MED ORDER — CEFAZOLIN SODIUM-DEXTROSE 2-4 GM/100ML-% IV SOLN
2.0000 g | INTRAVENOUS | Status: DC
  Filled 2017-10-03: qty 100

## 2017-10-03 NOTE — Progress Notes (Addendum)
[address]       [place] [address]             [phone]      20 Days Post-Op Procedure(s) (LRB): CORONARY ARTERY BYPASS GRAFTING (CABG) times four LIMA  to LAD, SVG sequentially to OM and RAMUS Intermediate and SVG to Acute Marginal (N/A) TRANSESOPHAGEAL ECHOCARDIOGRAM (TEE) (N/A) MITRAL VALVE REPAIR (MVR) (N/A) Subjective: No c/o , MS seems unchanged  Objective: Vital signs in last 24 hours: Temp:  [97.9 F (36.6 C)-98.8 F (37.1 C)] 98.4 F (36.9 C) (12/20 0501) Pulse Rate:  [76-80] 80 (12/20 0501) Cardiac Rhythm: Atrial flutter (12/19 1900) Resp:  [9-19] 12 (12/20 0501) BP: (80-135)/(21-65) 135/54 (12/20 0501) SpO2:  [97 %-100 %] 100 % (12/20 0501) Weight:  [215 lb 6.2 oz (97.7 kg)-217 lb 6 oz (98.6 kg)] 217 lb 6 oz (98.6 kg) (12/20 0501)  Hemodynamic parameters for last 24 hours:    Intake/Output from previous day: 12/19 0701 - 12/20 0700 In: 180 [P.O.:180] Out: 769  Intake/Output this shift: No intake/output data recorded.  General appearance: alert, cooperative and no distress Heart: regular rate and rhythm Lungs: dim left>right lower fields Abdomen: soft, nontender Extremities: no edema Wound: incis healing well  Lab Results: Recent Labs    10/01/17 0424 10/02/17 0730  WBC 11.8* 10.6*  HGB 8.6* 8.3*  HCT 25.4* 24.8*  PLT 412* 413*   BMET:  Recent Labs    10/01/17 0424 10/02/17 0718  NA 134* 133*  K 4.3 4.3  CL 94* 93*  CO2 28 25  GLUCOSE 181* 122*  BUN 59* 77*  CREATININE 5.82* 7.65*  CALCIUM 8.7* 8.5*    PT/INR:  Recent Labs    10/02/17 0234  LABPROT 27.2*  INR 2.54   ABG    Component Value Date/Time   PHART 7.427 09/15/2017 0320   HCO3 24.3 09/15/2017 0320   TCO2 26 09/14/2017 1645   ACIDBASEDEF 4.0 (H) 09/14/2017 0153   O2SAT 75.0 09/20/2017 0805   CBG (last 3)  Recent Labs    10/02/17 1625 10/02/17 2351 10/03/17 0619  GLUCAP 108* 152* 94    Meds Scheduled Meds: . amiodarone  200 mg  Per NG tube BID  . amitriptyline  25 mg Oral QHS  . aspirin  81 mg Oral Daily  . atorvastatin  80 mg Oral q1800  . bisacodyl  10 mg Oral Daily   Or  . bisacodyl  10 mg Rectal Daily  . chlorhexidine gluconate (MEDLINE KIT)  15 mL Mouth Rinse BID  . cinacalcet  120 mg Oral Q supper  . clopidogrel  75 mg Oral Daily  . doxercalciferol  2 mcg Intravenous Q M,W,F-HD  . feeding supplement (NEPRO CARB STEADY)  237 mL Oral BID BM  . Influenza vac split quadrivalent PF  0.5 mL Intramuscular Tomorrow-1000  . insulin aspart  0-5 Units Subcutaneous QHS  . insulin aspart  0-9 Units Subcutaneous TID WC  . insulin detemir  20 Units Subcutaneous BID  . levETIRAcetam  1,000 mg Per Tube Daily  . levETIRAcetam  500 mg Per Tube Q M,W,F-HD  . mouth rinse  15 mL Mouth Rinse QID  . metoprolol tartrate  25 mg Oral BID  . moving right along book   Does not apply Once  . multivitamin  1 tablet Oral QHS  . pantoprazole sodium  40 mg Per Tube Daily  . sevelamer carbonate  2,400 mg Oral TID WC  .  sodium chloride flush  3 mL Intravenous Q12H   Continuous Infusions: . sodium chloride    . lactated ringers 20 mL/hr at 09/13/17 1600  . magnesium sulfate     PRN Meds:.sodium chloride, ondansetron (ZOFRAN) IV, RESOURCE THICKENUP CLEAR, sodium chloride flush  Xrays Dg Chest 2 View  Result Date: 10/01/2017 CLINICAL DATA:  71 year old male postoperative day 17 status post CABG. EXAM: CHEST  2 VIEW COMPARISON:  09/25/2017 and earlier. FINDINGS: Semi upright AP and lateral views of the chest. Enteric tube removed. Low lung volumes. Moderate confluent opacity at the left lung base likely in part due to pleural effusion. Pulmonary vascular congestion. No pneumothorax. No confluent opacity in the right lung. Stable cardiomegaly and mediastinal contours. Sequelae of CABG and mitral valve repair. Visualized tracheal air column is within normal limits. No acute osseous abnormality identified. Negative visible bowel gas  pattern. IMPRESSION: 1. Low lung volumes with pulmonary vascular congestion. 2. Moderate left lung base opacity, probably a combination of pleural effusion and airspace disease. Electronically Signed   By: Genevie Ann M.D.   On: 10/01/2017 09:50    Assessment/Plan: S/P Procedure(s) (LRB): CORONARY ARTERY BYPASS GRAFTING (CABG) times four LIMA  to LAD, SVG sequentially to OM and RAMUS Intermediate and SVG to Acute Marginal (N/A) TRANSESOPHAGEAL ECHOCARDIOGRAM (TEE) (N/A) MITRAL VALVE REPAIR (MVR) (N/A)  1 stable hemodynamics sinus rhythm on current rx 2 nephrology managing ESRD 3 Vascular plans a fistulogram tomorrow 4 sugars adeq control 5 SNF at discharge, not a CIR candidate 6 most of phlebotomy staff have attempted blood draw and were unable to obtain today, Wil need INR tomorrow, ? Draw in dialysis   LOS: 25 days    Kaj Giovanni 10/03/2017 C/o feeling tired fistulogram tomorrow SNF placement  When available  Remo Lipps C. Roxan Hockey, MD [facility] [phone]

## 2017-10-03 NOTE — Progress Notes (Addendum)
Physical Therapy Treatment Patient Details Name: Ernest [name] MRN: [id number] DOB: 11-09-1945 Today's Date: 10/03/2017    History of Present Illness Pt adm 11/24 with NSTEMI and underwent CABG x 4 and MVR on 08/18/17. Post op lethargy and confusion. PMH - Multiple CVA's, DM, HTN, chf, ESRD on HD, cad, copd    PT Comments    Pt performed increased activity and able to progress to standing. Pt recalled 2/3 sternal precautions but remains to require assistance to maintain precautions.  Plan for SNF remains appropriate.  If patient continues to tolerate standing well, will attempt gait training next session.    Follow Up Recommendations  Supervision/Assistance - 24 hour;SNF     Equipment Recommendations  Other (comment)    Recommendations for Other Services       Precautions / Restrictions Precautions Precautions: Sternal;Fall Restrictions Weight Bearing Restrictions: Yes(sternal precautions)    Mobility  Bed Mobility Overal bed mobility: Needs Assistance Bed Mobility: Rolling;Sidelying to Sit Rolling: Max assist(assist to guide RLE into hooklying to allow patient to push during rolling.  ) Sidelying to sit: Max assist       General bed mobility comments: Pt required assistance to elevate trunk into sitting, for rolling, and for LE advancement to HOB.  In seated position patient able to tolerate sitting unassisted but required max assist to scoot forward to edge of bed with use of chux pad.    Transfers Overall transfer level: Needs assistance Equipment used: None;2 person hand held assist Transfers: Sit to/from Stand Sit to Stand: Mod assist;+2 physical assistance Stand pivot transfers: Max assist;+2 physical assistance       General transfer comment: Pt able to perform sit to stand from bed with +2 assistance.  Once in standing patient with flexed posture and shuffling steps toward chair.    Ambulation/Gait Ambulation/Gait assistance: (Pt performed shuflling side  steps from bed to chair with max assistance +2. )               Stairs            Wheelchair Mobility    Modified Rankin (Stroke Patients Only)       Balance Overall balance assessment: Needs assistance Sitting-balance support: Feet supported Sitting balance-Leahy Scale: Fair       Standing balance-Leahy Scale: Poor                              Cognition Arousal/Alertness: Lethargic;Suspect due to medications Behavior During Therapy: Flat affect Overall Cognitive Status: Impaired/Different from baseline Area of Impairment: Following commands;Problem solving                 Orientation Level: Time;Disoriented to                    Exercises      General Comments        Pertinent Vitals/Pain Pain Assessment: Faces Faces Pain Scale: Hurts a little bit Pain Location: generalized Pain Descriptors / Indicators: Grimacing Pain Intervention(s): Monitored during session;Repositioned    Home Living                      Prior Function            PT Goals (current goals can now be found in the care plan section) Acute Rehab PT Goals Patient Stated Goal: None stated Potential to Achieve Goals: Poor Progress towards PT goals: Progressing toward goals  Frequency    Min 3X/week      PT Plan Current plan remains appropriate    Co-evaluation              AM-PAC PT "6 Clicks" Daily Activity  Outcome Measure  Difficulty turning over in bed (including adjusting bedclothes, sheets and blankets)?: Unable Difficulty moving from lying on back to sitting on the side of the bed? : Unable Difficulty sitting down on and standing up from a chair with arms (e.g., wheelchair, bedside commode, etc,.)?: Unable Help needed moving to and from a bed to chair (including a wheelchair)?: A Lot Help needed walking in hospital room?: A Lot Help needed climbing 3-5 steps with a railing? : Total 6 Click Score: 8    End of  Session Equipment Utilized During Treatment: Gait belt Activity Tolerance: Patient tolerated treatment well Patient left: in chair;with call bell/phone within reach;with chair alarm set Nurse Communication: Mobility status PT Visit Diagnosis: Other abnormalities of gait and mobility (R26.89);Muscle weakness (generalized) (M62.81);Difficulty in walking, not elsewhere classified (R26.2)     Time: 2633-3545 PT Time Calculation (min) (ACUTE ONLY): 20 min  Charges:  $Therapeutic Activity: 8-22 mins                    G CodesGovernor Rooks, PTA pager [phone]    Cristela Blue 10/03/2017, 1:06 PM

## 2017-10-03 NOTE — H&P (View-Only) (Signed)
  Progress Note    10/03/2017 7:38 AM 20 Days Post-Op  Subjective:  Patient states L arm still feels tight   Vitals:   10/03/17 0327 10/03/17 0501  BP: 126/63 (!) 135/54  Pulse:  80  Resp: 11 12  Temp:  98.4 F (36.9 C)  SpO2:  100%   Physical Exam: Cardiac:  Regular Lungs:  Scattered adventitious sounds bilaterally  Extremities:  Palpable L radial; palpable pulsatile flow through L arm AV graft however no palpable thrill; persistent edema L arm Abdomen:  Soft Neurologic: lethargic  CBC    Component Value Date/Time   WBC 10.6 (H) 10/02/2017 0730   RBC 3.00 (L) 10/02/2017 0730   HGB 8.3 (L) 10/02/2017 0730   HCT 24.8 (L) 10/02/2017 0730   PLT 413 (H) 10/02/2017 0730   MCV 82.7 10/02/2017 0730   MCH 27.7 10/02/2017 0730   MCHC 33.5 10/02/2017 0730   RDW 16.2 (H) 10/02/2017 0730   LYMPHSABS 1.2 09/08/2017 0046   MONOABS 0.8 09/08/2017 0046   EOSABS 0.0 09/08/2017 0046   BASOSABS 0.0 09/08/2017 0046    BMET    Component Value Date/Time   NA 133 (L) 10/02/2017 0718   K 4.3 10/02/2017 0718   CL 93 (L) 10/02/2017 0718   CO2 25 10/02/2017 0718   GLUCOSE 122 (H) 10/02/2017 0718   BUN 77 (H) 10/02/2017 0718   CREATININE 7.65 (H) 10/02/2017 0718   CALCIUM 8.5 (L) 10/02/2017 0718   GFRNONAA 6 (L) 10/02/2017 0718   GFRAA 7 (L) 10/02/2017 0718    INR    Component Value Date/Time   INR 2.54 10/02/2017 0234     Intake/Output Summary (Last 24 hours) at 10/03/2017 0738 Last data filed at 10/03/2017 0500 Gross per 24 hour  Intake 180 ml  Output 769 ml  Net -589 ml     Assessment/Plan:  71 y.o. male with edematous L arm 20 Days Post-Op   Elevated velocities at outflow of upper arm AV graft by duplex Coumadin being held Plan is for L arm fistulogram tomorrow 10/04/17 NPO after midnight INR ordered    Dagoberto Ligas, [name] [facility] [phone] 10/03/2017 7:38 AM  I have interviewed the patient and examined the patient. I  agree with the findings by the PA. If INR is down, plan fistulogram tomorrow.   Gae Gallop, MD [phone]

## 2017-10-03 NOTE — Progress Notes (Addendum)
  Progress Note    10/03/2017 7:38 AM 20 Days Post-Op  Subjective:  Patient states L arm still feels tight   Vitals:   10/03/17 0327 10/03/17 0501  BP: 126/63 (!) 135/54  Pulse:  80  Resp: 11 12  Temp:  98.4 F (36.9 C)  SpO2:  100%   Physical Exam: Cardiac:  Regular Lungs:  Scattered adventitious sounds bilaterally  Extremities:  Palpable L radial; palpable pulsatile flow through L arm AV graft however no palpable thrill; persistent edema L arm Abdomen:  Soft Neurologic: lethargic  CBC    Component Value Date/Time   WBC 10.6 (H) 10/02/2017 0730   RBC 3.00 (L) 10/02/2017 0730   HGB 8.3 (L) 10/02/2017 0730   HCT 24.8 (L) 10/02/2017 0730   PLT 413 (H) 10/02/2017 0730   MCV 82.7 10/02/2017 0730   MCH 27.7 10/02/2017 0730   MCHC 33.5 10/02/2017 0730   RDW 16.2 (H) 10/02/2017 0730   LYMPHSABS 1.2 09/08/2017 0046   MONOABS 0.8 09/08/2017 0046   EOSABS 0.0 09/08/2017 0046   BASOSABS 0.0 09/08/2017 0046    BMET    Component Value Date/Time   NA 133 (L) 10/02/2017 0718   K 4.3 10/02/2017 0718   CL 93 (L) 10/02/2017 0718   CO2 25 10/02/2017 0718   GLUCOSE 122 (H) 10/02/2017 0718   BUN 77 (H) 10/02/2017 0718   CREATININE 7.65 (H) 10/02/2017 0718   CALCIUM 8.5 (L) 10/02/2017 0718   GFRNONAA 6 (L) 10/02/2017 0718   GFRAA 7 (L) 10/02/2017 0718    INR    Component Value Date/Time   INR 2.54 10/02/2017 0234     Intake/Output Summary (Last 24 hours) at 10/03/2017 0738 Last data filed at 10/03/2017 0500 Gross per 24 hour  Intake 180 ml  Output 769 ml  Net -589 ml     Assessment/Plan:  71 y.o. male with edematous L arm 20 Days Post-Op   Elevated velocities at outflow of upper arm AV graft by duplex Coumadin being held Plan is for L arm fistulogram tomorrow 10/04/17 NPO after midnight INR ordered    Dagoberto Ligas, [name] [facility] [phone] 10/03/2017 7:38 AM  I have interviewed the patient and examined the patient. I  agree with the findings by the PA. If INR is down, plan fistulogram tomorrow.   Gae Gallop, MD [phone]

## 2017-10-03 NOTE — Progress Notes (Signed)
Physical Therapy Treatment Patient Details Name: Ernest [name] MRN: [id number] DOB: 06-Jul-1946 Today's Date: 10/03/2017    History of Present Illness Pt adm 11/24 with NSTEMI and underwent CABG x 4 and MVR on 08/18/17. Post op lethargy and confusion. PMH - Multiple CVA's, DM, HTN, chf, ESRD on HD, cad, copd    PT Comments    Pt performed transfer from recliner back to bed after NT asking for assistance.  PTA educated nursing staff on back to bed transfer and assisted patient this afternoon.  Post transfer patient resting comfortably in bed.     Follow Up Recommendations  Supervision/Assistance - 24 hour;SNF     Equipment Recommendations  Other (comment)    Recommendations for Other Services       Precautions / Restrictions Precautions Precautions: Sternal;Fall Restrictions Weight Bearing Restrictions: Yes(sternal precautions)    Mobility  Bed Mobility Overal bed mobility: Needs Assistance     Sit to supine: Max assist;+2 for physical assistance   General bed mobility comments: Pt required +2 assist to descend trunk and elevate B LEs into bed.  In supine +2 max to scoot to [facility].  Pt given cues to flex B knees and push with LEs to assist in boosting to [facility].    Transfers Overall transfer level: Needs assistance Equipment used: None;2 person hand held assist Transfers: Sit to/from Stand Sit to Stand: Mod assist;+2 physical assistance Stand pivot transfers: Max assist;+2 physical assistance       General transfer comment: Pt able to perform sit to stand from recliner with +2 assistance.  Once in standing patient with flexed posture and shuffling steps toward bed.    Ambulation/Gait Ambulation/Gait assistance: (shuffling steps from chair back to bed.  +2 max assistance)               Stairs            Wheelchair Mobility    Modified Rankin (Stroke Patients Only)       Balance Overall balance assessment: Needs assistance Sitting-balance support:  Feet supported Sitting balance-Leahy Scale: Fair       Standing balance-Leahy Scale: Poor                              Cognition Arousal/Alertness: Lethargic;Suspect due to medications Behavior During Therapy: Flat affect Overall Cognitive Status: Impaired/Different from baseline Area of Impairment: Following commands;Problem solving                 Orientation Level: Time;Disoriented to                    Exercises      General Comments        Pertinent Vitals/Pain Pain Assessment: Faces Faces Pain Scale: Hurts a little bit Pain Location: generalized Pain Descriptors / Indicators: Grimacing Pain Intervention(s): Monitored during session;Repositioned    Home Living                      Prior Function            PT Goals (current goals can now be found in the care plan section) Acute Rehab PT Goals Patient Stated Goal: None stated Potential to Achieve Goals: Poor Progress towards PT goals: Progressing toward goals    Frequency    Min 3X/week      PT Plan Current plan remains appropriate    Co-evaluation  AM-PAC PT "6 Clicks" Daily Activity  Outcome Measure  Difficulty turning over in bed (including adjusting bedclothes, sheets and blankets)?: Unable Difficulty moving from lying on back to sitting on the side of the bed? : Unable Difficulty sitting down on and standing up from a chair with arms (e.g., wheelchair, bedside commode, etc,.)?: Unable Help needed moving to and from a bed to chair (including a wheelchair)?: A Lot Help needed walking in hospital room?: A Lot Help needed climbing 3-5 steps with a railing? : Total 6 Click Score: 8    End of Session Equipment Utilized During Treatment: Gait belt Activity Tolerance: Patient tolerated treatment well Patient left: in chair;with call bell/phone within reach;with chair alarm set Nurse Communication: Mobility status PT Visit Diagnosis: Other  abnormalities of gait and mobility (R26.89);Muscle weakness (generalized) (M62.81);Difficulty in walking, not elsewhere classified (R26.2)     Time: 1355-1405 PT Time Calculation (min) (ACUTE ONLY): 10 min  Charges:   $Therapeutic Activity: 8-22 mins                    G CodesGovernor [name], PTA pager [phone]    Ernest [name] 10/03/2017, 4:44 PM

## 2017-10-03 NOTE — Progress Notes (Signed)
Attempted to call wife 3 times to get consent signed. LM on cell phone number to call us. Pola Corn, RN

## 2017-10-03 NOTE — Progress Notes (Signed)
Admit: 09/07/2017 LOS: 25  9M ESRD MWF [place] s/p 4V CABG  Subjective:  HD yesterday: post weight 97.7kg, 0.8L UF; tolerated w/o complcations For Fgram tomorrow to eval central veins No new events  12/19 0701 - 12/20 0700 In: 180 [P.O.:180] Out: 769   Filed Weights   10/02/17 0700 10/02/17 1115 10/03/17 0501  Weight: 98.8 kg (217 lb 13 oz) 97.7 kg (215 lb 6.2 oz) 98.6 kg (217 lb 6 oz)    Scheduled Meds: . amiodarone  200 mg Per NG tube BID  . amitriptyline  25 mg Oral QHS  . aspirin  81 mg Oral Daily  . atorvastatin  80 mg Oral q1800  . bisacodyl  10 mg Oral Daily   Or  . bisacodyl  10 mg Rectal Daily  . chlorhexidine gluconate (MEDLINE KIT)  15 mL Mouth Rinse BID  . cinacalcet  120 mg Oral Q supper  . clopidogrel  75 mg Oral Daily  . doxercalciferol  2 mcg Intravenous Q M,W,F-HD  . feeding supplement (NEPRO CARB STEADY)  237 mL Oral BID BM  . Influenza vac split quadrivalent PF  0.5 mL Intramuscular Tomorrow-1000  . insulin aspart  0-5 Units Subcutaneous QHS  . insulin aspart  0-9 Units Subcutaneous TID WC  . insulin detemir  20 Units Subcutaneous BID  . levETIRAcetam  1,000 mg Per Tube Daily  . levETIRAcetam  500 mg Per Tube Q M,W,F-HD  . mouth rinse  15 mL Mouth Rinse QID  . metoprolol tartrate  25 mg Oral BID  . moving right along book   Does not apply Once  . multivitamin  1 tablet Oral QHS  . pantoprazole sodium  40 mg Per Tube Daily  . sevelamer carbonate  2,400 mg Oral TID WC  . sodium chloride flush  3 mL Intravenous Q12H   Continuous Infusions: . sodium chloride    . lactated ringers 20 mL/hr at 09/13/17 1600  . magnesium sulfate     PRN Meds:.sodium chloride, ondansetron (ZOFRAN) IV, RESOURCE THICKENUP CLEAR, sodium chloride flush  Current Labs: reviewed    Physical Exam:  Blood pressure (!) 118/58, pulse 79, temperature 98.5 F (36.9 C), temperature source Oral, resp. rate 19, height _0  (1.753 m), weight 98.6 kg (217 lb 6 oz), SpO2 94 %. No  LEE Swollen LUE RRR CTAB S/nt/nd  Dialysis prescription: [place] MWF 4.25 hours 2K2Ca EDW 95.5 kg Venofer 50/week Calcitriol 0.75 TIW with HD No anticoagulation LUE AVG  A 1. ESRD MWF [place] via AVF 2. 4V CABG 09/13/17 3. Post surgery AMS; waxing/waning; still not as good as I know him to be from outpt HD 4. ANemia of CKD, not on ESA here 5. DM2 6. Hx/o CVA 7. Debility, for SNF at discharge 8. LUE AVG for eval tomorrow with [facility] 9. CKD BMD, sensipar 117m/d; hectorol 25m qTx. Sevelmer 2400 qAC  P 1. Cont HD MWF, 2K, AVG 400/800, 4h, 3L UF, hold heparin 2. Start Aranesp 100 qFri, check Fe stores 3. Will check INR at HD tomorrow    RyPearson GrippeD 10/03/2017, 10:01 AM  Recent Labs  Lab 09/30/17 0308 10/01/17 0424 10/02/17 0718  NA 131* 134* 133*  K 4.6 4.3 4.3  CL 91* 94* 93*  CO2 _1 GLUCOSE 180* 181* 122*  BUN 91* 59* 77*  CREATININE 7.33* 5.82* 7.65*  CALCIUM 9.0 8.7* 8.5*  PHOS  --   --  5.8*   Recent Labs  Lab 09/30/17  0308 10/01/17 0424 10/02/17 0730  WBC 15.3* 11.8* 10.6*  HGB 8.9* 8.6* 8.3*  HCT 26.3* 25.4* 24.8*  MCV 81.7 81.7 82.7  PLT 448* 412* 413*

## 2017-10-04 ENCOUNTER — Encounter (HOSPITAL_COMMUNITY): Payer: Self-pay | Admitting: Vascular Surgery

## 2017-10-04 ENCOUNTER — Encounter (HOSPITAL_COMMUNITY)
Admission: EM | Disposition: A | Discharge status: Skilled Nursing Facility | Payer: Self-pay | Source: Home / Self Care | Attending: Thoracic Surgery (Cardiothoracic Vascular Surgery)

## 2017-10-04 DIAGNOSIS — Z951 Presence of aortocoronary bypass graft: Secondary | ICD-10-CM

## 2017-10-04 HISTORY — PX: PERIPHERAL VASCULAR BALLOON ANGIOPLASTY: CATH118281

## 2017-10-04 HISTORY — PX: A/V FISTULAGRAM: CATH118298

## 2017-10-04 LAB — GLUCOSE, CAPILLARY
GLUCOSE-CAPILLARY: 59 mg/dL — AB (ref 65–99)
GLUCOSE-CAPILLARY: 142 mg/dL — AB (ref 65–99)
Glucose-Capillary: 71 mg/dL (ref 65–99)
Glucose-Capillary: 83 mg/dL (ref 65–99)
Glucose-Capillary: 221 mg/dL — ABNORMAL HIGH (ref 65–99)

## 2017-10-04 SURGERY — A/V FISTULAGRAM
Anesthesia: LOCAL

## 2017-10-04 MED ORDER — CHLORHEXIDINE GLUCONATE 0.12% ORAL RINSE (MEDLINE KIT)
15.0000 mL | Freq: Two times a day (BID) | OROMUCOSAL | Status: DC
  Administered 2017-10-04 – 2017-10-06 (×2): 15 mL via OROMUCOSAL

## 2017-10-04 MED ORDER — CINACALCET HCL 30 MG PO TABS
120.0000 mg | ORAL_TABLET | Freq: Every day | ORAL | Status: DC
  Administered 2017-10-05 – 2017-10-07 (×3): 120 mg via ORAL
  Filled 2017-10-04 (×3): qty 4

## 2017-10-04 MED ORDER — INFLUENZA VAC SPLIT HIGH-DOSE 0.5 ML IM SUSY
0.5000 mL | PREFILLED_SYRINGE | INTRAMUSCULAR | Status: DC
  Filled 2017-10-04: qty 0.5

## 2017-10-04 MED ORDER — ASPIRIN 81 MG PO CHEW
81.0000 mg | CHEWABLE_TABLET | Freq: Every day | ORAL | Status: DC
  Administered 2017-10-04 – 2017-10-06 (×3): 81 mg via ORAL
  Filled 2017-10-04 (×3): qty 1

## 2017-10-04 MED ORDER — LIDOCAINE HCL (PF) 1 % IJ SOLN
INTRAMUSCULAR | Status: AC
  Filled 2017-10-04: qty 30

## 2017-10-04 MED ORDER — LEVETIRACETAM 100 MG/ML PO SOLN
1000.0000 mg | Freq: Every day | ORAL | Status: DC
  Administered 2017-10-04 – 2017-10-06 (×3): 1000 mg
  Filled 2017-10-04 (×4): qty 10

## 2017-10-04 MED ORDER — HEPARIN (PORCINE) IN NACL 2-0.9 UNIT/ML-% IJ SOLN
INTRAMUSCULAR | Status: AC | PRN
  Administered 2017-10-04: 500 mL

## 2017-10-04 MED ORDER — AMITRIPTYLINE HCL 50 MG PO TABS
25.0000 mg | ORAL_TABLET | Freq: Every day | ORAL | Status: DC
  Administered 2017-10-04 – 2017-10-06 (×3): 25 mg via ORAL
  Filled 2017-10-04 (×3): qty 1

## 2017-10-04 MED ORDER — MOVING RIGHT ALONG BOOK
Freq: Once | Status: AC
  Administered 2017-10-04: 15:00:00
  Filled 2017-10-04: qty 1

## 2017-10-04 MED ORDER — SODIUM CHLORIDE 0.9 % IV SOLN: 250.0000 mL | INTRAVENOUS | Status: DC | PRN

## 2017-10-04 MED ORDER — BISACODYL 10 MG RE SUPP: 10.0000 mg | Freq: Every day | RECTAL | Status: DC

## 2017-10-04 MED ORDER — METOPROLOL TARTRATE 25 MG PO TABS
25.0000 mg | ORAL_TABLET | Freq: Two times a day (BID) | ORAL | Status: DC
  Administered 2017-10-04 – 2017-10-06 (×5): 25 mg via ORAL
  Filled 2017-10-04 (×6): qty 1

## 2017-10-04 MED ORDER — WARFARIN - PHARMACIST DOSING INPATIENT: Freq: Every day | Status: DC

## 2017-10-04 MED ORDER — RENA-VITE PO TABS
1.0000 | ORAL_TABLET | Freq: Every day | ORAL | Status: DC
  Administered 2017-10-04 – 2017-10-06 (×3): 1 via ORAL
  Filled 2017-10-04 (×3): qty 1

## 2017-10-04 MED ORDER — PANTOPRAZOLE SODIUM 40 MG PO PACK
40.0000 mg | PACK | Freq: Every day | ORAL | Status: DC
  Administered 2017-10-04 – 2017-10-06 (×3): 40 mg
  Filled 2017-10-04 (×3): qty 20

## 2017-10-04 MED ORDER — LACTATED RINGERS IV SOLN: INTRAVENOUS | Status: DC

## 2017-10-04 MED ORDER — INSULIN DETEMIR 100 UNIT/ML ~~LOC~~ SOLN
20.0000 [IU] | Freq: Two times a day (BID) | SUBCUTANEOUS | Status: DC
  Administered 2017-10-04 – 2017-10-06 (×4): 20 [IU] via SUBCUTANEOUS
  Filled 2017-10-04 (×8): qty 0.2

## 2017-10-04 MED ORDER — LEVETIRACETAM 100 MG/ML PO SOLN
500.0000 mg | ORAL | Status: DC
  Administered 2017-10-04: 500 mg
  Filled 2017-10-04 (×2): qty 5

## 2017-10-04 MED ORDER — DOXERCALCIFEROL 4 MCG/2ML IV SOLN
2.0000 ug | INTRAVENOUS | Status: DC
  Administered 2017-10-05: 09:00:00 via INTRAVENOUS
  Filled 2017-10-04: qty 2

## 2017-10-04 MED ORDER — SODIUM CHLORIDE 0.9% FLUSH: 3.0000 mL | INTRAVENOUS | Status: DC | PRN

## 2017-10-04 MED ORDER — HEPARIN (PORCINE) IN NACL 2-0.9 UNIT/ML-% IJ SOLN
INTRAMUSCULAR | Status: AC
  Filled 2017-10-04: qty 500

## 2017-10-04 MED ORDER — WARFARIN SODIUM 2.5 MG PO TABS: 2.5000 mg | ORAL_TABLET | Freq: Once | ORAL | Status: DC

## 2017-10-04 MED ORDER — BISACODYL 5 MG PO TBEC
10.0000 mg | DELAYED_RELEASE_TABLET | Freq: Every day | ORAL | Status: DC
  Administered 2017-10-04 – 2017-10-06 (×2): 10 mg via ORAL
  Filled 2017-10-04 (×2): qty 2

## 2017-10-04 MED ORDER — HEPARIN SODIUM (PORCINE) 1000 UNIT/ML IJ SOLN
INTRAMUSCULAR | Status: DC | PRN
  Administered 2017-10-04: 5000 [IU] via INTRAVENOUS

## 2017-10-04 MED ORDER — INSULIN ASPART 100 UNIT/ML ~~LOC~~ SOLN
0.0000 [IU] | Freq: Three times a day (TID) | SUBCUTANEOUS | Status: DC
  Administered 2017-10-04: 3 [IU] via SUBCUTANEOUS
  Administered 2017-10-06: 1 [IU] via SUBCUTANEOUS

## 2017-10-04 MED ORDER — NEPRO/CARBSTEADY PO LIQD
237.0000 mL | Freq: Two times a day (BID) | ORAL | Status: DC
  Administered 2017-10-04 – 2017-10-06 (×3): 237 mL via ORAL

## 2017-10-04 MED ORDER — HEPARIN SODIUM (PORCINE) 1000 UNIT/ML IJ SOLN
INTRAMUSCULAR | Status: AC
  Filled 2017-10-04: qty 1

## 2017-10-04 MED ORDER — AMIODARONE HCL 200 MG PO TABS
200.0000 mg | ORAL_TABLET | Freq: Two times a day (BID) | ORAL | Status: DC
  Administered 2017-10-04 – 2017-10-07 (×7): 200 mg via NASOGASTRIC
  Filled 2017-10-04 (×6): qty 1

## 2017-10-04 MED ORDER — CLOPIDOGREL BISULFATE 75 MG PO TABS
75.0000 mg | ORAL_TABLET | Freq: Every day | ORAL | Status: DC
  Administered 2017-10-04 – 2017-10-06 (×3): 75 mg via ORAL
  Filled 2017-10-04 (×3): qty 1

## 2017-10-04 MED ORDER — INSULIN ASPART 100 UNIT/ML ~~LOC~~ SOLN: 0.0000 [IU] | Freq: Every day | SUBCUTANEOUS | Status: DC

## 2017-10-04 MED ORDER — SEVELAMER CARBONATE 800 MG PO TABS
2400.0000 mg | ORAL_TABLET | Freq: Three times a day (TID) | ORAL | Status: DC
  Administered 2017-10-04 – 2017-10-07 (×8): 2400 mg via ORAL
  Filled 2017-10-04 (×6): qty 3

## 2017-10-04 MED ORDER — SODIUM CHLORIDE 0.9% FLUSH
3.0000 mL | Freq: Two times a day (BID) | INTRAVENOUS | Status: DC
  Administered 2017-10-04 – 2017-10-05 (×4): 3 mL via INTRAVENOUS

## 2017-10-04 MED ORDER — LIDOCAINE HCL (PF) 1 % IJ SOLN
INTRAMUSCULAR | Status: DC | PRN
  Administered 2017-10-04: 5 mL

## 2017-10-04 MED ORDER — ATORVASTATIN CALCIUM 80 MG PO TABS
80.0000 mg | ORAL_TABLET | Freq: Every day | ORAL | Status: DC
  Administered 2017-10-04 – 2017-10-07 (×4): 80 mg via ORAL
  Filled 2017-10-04 (×4): qty 1

## 2017-10-04 MED ORDER — RESOURCE THICKENUP CLEAR PO POWD
ORAL | Status: DC | PRN
  Filled 2017-10-04: qty 125

## 2017-10-04 MED ORDER — ONDANSETRON HCL 4 MG/2ML IJ SOLN: 4.0000 mg | Freq: Four times a day (QID) | INTRAMUSCULAR | Status: DC | PRN

## 2017-10-04 MED ORDER — IODIXANOL 320 MG/ML IV SOLN
INTRAVENOUS | Status: DC | PRN
  Administered 2017-10-04: 70 mL via INTRAVENOUS

## 2017-10-04 SURGICAL SUPPLY — 20 items
BAG SNAP BAND KOVER 36X36 (MISCELLANEOUS) ×3 IMPLANT
BALLN ATLAS 14X40X75 (BALLOONS) ×3
BALLN MUSTANG 12.0X40 75 (BALLOONS) ×3
BALLN MUSTANG 8.0X40 75 (BALLOONS) ×3
BALLOON ATLAS 14X40X75 (BALLOONS) ×2 IMPLANT
BALLOON MUSTANG 12.0X40 75 (BALLOONS) ×2 IMPLANT
BALLOON MUSTANG 8.0X40 75 (BALLOONS) ×2 IMPLANT
COVER DOME SNAP 22 D (MISCELLANEOUS) ×3 IMPLANT
COVER PRB 48X5XTLSCP FOLD TPE (BAG) ×2 IMPLANT
COVER PROBE 5X48 (BAG) ×1
HOVERMATT SINGLE USE (MISCELLANEOUS) ×3 IMPLANT
KIT ENCORE 26 ADVANTAGE (KITS) ×3 IMPLANT
KIT MICROINTRODUCER STIFF 5F (SHEATH) ×3 IMPLANT
PROTECTION STATION PRESSURIZED (MISCELLANEOUS) ×3
SHEATH PINNACLE R/O II 7F 4CM (SHEATH) ×3 IMPLANT
STATION PROTECTION PRESSURIZED (MISCELLANEOUS) ×2 IMPLANT
STOPCOCK MORSE 400PSI 3WAY (MISCELLANEOUS) ×3 IMPLANT
TRAY PV CATH (CUSTOM PROCEDURE TRAY) ×3 IMPLANT
TUBING CIL FLEX 10 FLL-RA (TUBING) ×3 IMPLANT
WIRE BENTSON .035X145CM (WIRE) ×3 IMPLANT

## 2017-10-04 NOTE — Progress Notes (Signed)
Physical Therapy Treatment Patient Details Name: Ernest [name] MRN: [id number] DOB: October 02, 1946 Today's Date: 10/04/2017    History of Present Illness Pt adm 11/24 with NSTEMI and underwent CABG x 4 and MVR on 08/18/17. Post op lethargy and confusion. PMH - Multiple CVA's, DM, HTN, chf, ESRD on HD, cad, copd    PT Comments    Pt performed increased acitvity and able to progress to minimal gait along the Head of the bed.  Pt required max VCs for encouragement and participation during session.  Plan next session for close chair follow in efforts to progress gait distance.  Pt remains to benefit from skilled placement at SNF before returning to private residence to improve strength and functional mobility.  Pt returned to bed as he is awaiting transfer to dialysis for treatment this pm.    Follow Up Recommendations  Supervision/Assistance - 24 hour;SNF     Equipment Recommendations  Other (comment)    Recommendations for Other Services       Precautions / Restrictions Precautions Precautions: Sternal;Fall Restrictions Weight Bearing Restrictions: Yes(sternal precautions) Other Position/Activity Restrictions: Sternal precautions    Mobility  Bed Mobility Overal bed mobility: Needs Assistance Bed Mobility: Rolling;Sidelying to Sit;Sit to Sidelying Rolling: Mod assist Sidelying to sit: Mod assist     Sit to sidelying: Min assist General bed mobility comments: Pt required assistance to roll to sidelying, mod assist to elevate trunk into sitting and advance LEs to edge of bed.  Upon return to bed patient able to lift B LEs back into bed.    Transfers Overall transfer level: Needs assistance Equipment used: None(face to face transfer with patient holding heart pillow.  ) Transfers: Sit to/from Stand Sit to Stand: Mod assist         General transfer comment: Pt required cues to hold to heart pillow during transfer.  PTA performed transfer face to face with patient and  patient able to stand with trunk extended.  In standing patient able to take side steps to [facility] before returning to sitting.  Pt with improved eccentric loading when returning to seated surface.    Ambulation/Gait Ambulation/Gait assistance: Mod assist Ambulation Distance (Feet): 2 Feet Assistive device: None(Face to face with PTA holding to gait belt and patient holding heart pillow.  ) Gait Pattern/deviations: Step-to pattern   Gait velocity interpretation: Below normal speed for age/gender General Gait Details: Pt performed side stepping with mod assistance +1.  Pt will require chair follow next session to progress gait distance.  Balance remains POOR during gait along edge of bed.   Stairs            Wheelchair Mobility    Modified Rankin (Stroke Patients Only)       Balance Overall balance assessment: Needs assistance   Sitting balance-Leahy Scale: Fair       Standing balance-Leahy Scale: Poor Standing balance comment: Pt unsteady in standing and required mod assist to maintain balance.                              Cognition Arousal/Alertness: Awake/alert Behavior During Therapy: WFL for tasks assessed/performed Overall Cognitive Status: Within Functional Limits for tasks assessed                                        Exercises General Exercises - Lower Extremity Long  Arc Quad: AROM;Both;Seated;10 reps Hip Flexion/Marching: AROM;Both;10 reps;Seated    General Comments        Pertinent Vitals/Pain Pain Assessment: 0-10 Faces Pain Scale: Hurts a little bit Pain Location: generalized Pain Descriptors / Indicators: Grimacing Pain Intervention(s): Monitored during session;Repositioned    Home Living                      Prior Function            PT Goals (current goals can now be found in the care plan section) Acute Rehab PT Goals Patient Stated Goal: None stated Potential to Achieve Goals: Poor Progress  towards PT goals: Progressing toward goals    Frequency    Min 3X/week      PT Plan Current plan remains appropriate    Co-evaluation              AM-PAC PT "6 Clicks" Daily Activity  Outcome Measure  Difficulty turning over in bed (including adjusting bedclothes, sheets and blankets)?: Unable Difficulty moving from lying on back to sitting on the side of the bed? : Unable Difficulty sitting down on and standing up from a chair with arms (e.g., wheelchair, bedside commode, etc,.)?: Unable Help needed moving to and from a bed to chair (including a wheelchair)?: A Lot Help needed walking in hospital room?: A Lot Help needed climbing 3-5 steps with a railing? : Total 6 Click Score: 8    End of Session Equipment Utilized During Treatment: Gait belt Activity Tolerance: Patient tolerated treatment well Patient left: in chair;with call bell/phone within reach;with chair alarm set Nurse Communication: Mobility status PT Visit Diagnosis: Other abnormalities of gait and mobility (R26.89);Muscle weakness (generalized) (M62.81);Difficulty in walking, not elsewhere classified (R26.2)     Time: 3545-6256 PT Time Calculation (min) (ACUTE ONLY): 23 min  Charges:  $Therapeutic Activity: 23-37 mins                    G Codes:       Governor Rooks, PTA pager [phone]    Cristela Blue 10/04/2017, 3:23 PM

## 2017-10-04 NOTE — Discharge Summary (Signed)
Physician Discharge Summary  Patient ID: Ernest [name] MRN: [id number] DOB/AGE: 69-Apr-1947 69 y.o.  Admit date: 09/07/2017 Discharge date: 10/07/2017  Admission Diagnoses:  Patient Active Problem List   Diagnosis Date Noted  . Cerebral embolism with cerebral infarction 09/20/2017  . NSVT (nonsustained ventricular tachycardia) ([place])   . Elevated troponin   . Dyspnea 09/08/2017  . Non-ST elevation MI (NSTEMI) ([place])   . Acute on chronic combined systolic and diastolic heart failure ([place])   . End-stage renal disease on hemodialysis ([place])   . Left leg weakness   . TIA (transient ischemic attack) 10/27/2016  . History of CVA with residual deficit 08/07/2015  . Seizure, late effect of stroke ([place]) 08/07/2015  . Diabetic peripheral neuropathy ([place]) 08/07/2015  . Obstructive sleep apnea 06/16/2013  . Hyperlipidemia associated with type 2 diabetes mellitus ([place])   . ESRD (end stage renal disease) ([place])   . Erectile dysfunction associated with type 2 diabetes mellitus ([place])   . Hypertension due to end stage renal disease caused by type 2 diabetes mellitus, on dialysis ([place])   . COPD (chronic obstructive pulmonary disease) ([place])   . Coronary artery disease   . Chronic combined systolic and diastolic CHF (congestive heart failure) ([place])   . Gout, unspecified 02/28/2009  . Uncontrolled type 2 diabetes mellitus with end-stage renal disease ([place]) 06/08/1984   Discharge Diagnoses:   Patient Active Problem List   Diagnosis Date Noted  . S/P CABG x 4 10/04/2017  . Cerebral embolism with cerebral infarction 09/20/2017  . NSVT (nonsustained ventricular tachycardia) ([place])   . Elevated troponin   . Dyspnea 09/08/2017  . Non-ST elevation MI (NSTEMI) ([place])   . Acute on chronic combined systolic and diastolic heart failure ([place])   . End-stage renal disease on hemodialysis ([place])   . Left leg weakness   . TIA (transient ischemic attack) 10/27/2016  . History of CVA with residual deficit  08/07/2015  . Seizure, late effect of stroke ([place]) 08/07/2015  . Diabetic peripheral neuropathy ([place]) 08/07/2015  . Obstructive sleep apnea 06/16/2013  . Hyperlipidemia associated with type 2 diabetes mellitus ([place])   . ESRD (end stage renal disease) ([place])   . Erectile dysfunction associated with type 2 diabetes mellitus ([place])   . Hypertension due to end stage renal disease caused by type 2 diabetes mellitus, on dialysis ([place])   . COPD (chronic obstructive pulmonary disease) ([place])   . Coronary artery disease   . Chronic combined systolic and diastolic CHF (congestive heart failure) ([place])   . Gout, unspecified 02/28/2009  . Uncontrolled type 2 diabetes mellitus with end-stage renal disease ([place]) 06/08/1984   Discharged Condition: fair  History of Present Illness:  Ernest [name] is a 71 yo obese AA male with a complex medical history including insulin-dependent diabetes mellitus complicated by end-stage renal disease on hemodialysis, coronary artery disease, chronic systolic congestive heart failure, hepatitis C, hyperlipidemia, hypertension, obesity, diabetic retinopathy, 5 previous strokes, and gout.  His most recent stroke was about a year ago and he has been left with residual weakness and slurred speech.  He presented to the emergency room on 09/08/2017 with a 2-day history of worsening shortness of breath.  He also complained of coughing and chest tightness.  He denied any chest pain or pressure.  He was admitted with acute on chronic systolic and diastolic heart failure.  He ruled in for a non-ST elevation MI.  Hospital Course:   He underwent cardiac catheterization on 09/09/2017 and was found to have  significant CAD with LM involvement.  It was felt coronary bypass grafting would be indicated and TCTS consult was obtained.  He was evaluated by Dr. Roxan Hockey at which time he admitted to having a brief episode of chest tightness and shortness of breath which had resolved.  Dr. Roxan Hockey  spoke at length with the patient and his wife about the patient being a high risk candidate for surgery.  The risks and benefits of the procedure were explained to the patient and they wished to discuss things further with their family.  They ultimately decided to proceed with bypass surgery.  He was on Plavix and would require washout prior to proceeding.  He was taken to the operating room on 09/13/2017.  He underwent CABG x 4 utilizing LIMA to LAD, Sequential SVG to OM and Ramus Intermediate and SVG to Acute Marginal.  He also underwent endoscopic vein harvest from his right leg.  He also required Mitral Valve Repair with a 32 mm annuloplasty ring.  He tolerated the procedure and was taken to the SICU in stable condition.  During his stay in the SICU the patient was weaned and extubated on POD #1.  He was weaned off Milrinone and Dopamine as tolerated.  Nephrology evaluated the patient and dialysis was resumed on POD #2.  His chest tubes and arterial lines were removed without difficulty.  The patient had confusion post operatively.  Repeat head CT scan was obtained and was stable from his baseline scans.  It was felt he likely had toxic metabolic encephalopathy.  Sedating medications were stopped and he was restarted on home Elavil.  He required levophed as his blood pressure was soft after dialysis.  This was weaned off as tolerated and he was placed on Midodrine for additional support.  He developed atrial flutter and was started on Amiodarone drip. He did not covert and was started on Heparin drip due to underlying MV Repair.  The patients mental status remains variable but had overall had worsened from previous days.  Repeat CT scan was obtained and again did not show evidence of acute event.  Neurology consult was obtained and recommended MRI, but due to recent surgery patient was unable to have this completed.  Due to patients mental status he was not cooperative overall.  He was started on tube feedings  to ensure adequate nutrition.  The patients mental status slowly improved.  He became more alert and was able to answer more questions.  He converted to NSR and his pacing wires were removed.  His heparin was discontinued and he was started on low dose coumadin with an INR goal of 2.0.  Swallow evaluation was obtained and the patient passed and was started on a renal diet.  His blood pressure improved and he was able to be taken off Midodrine.  He was restarted on low dose beta blocker.  He was participating with PT/OT.  He was felt medically stable for transfer to the step down unit on 09/30/2017.  The patient continued to make slow progress.  He remains in NSR.  His pacing wires were removed prior to discharge.  He developed left arm swelling at the time of his fistula.  Duplex was obtained and showed no evidence of DVT but there was stenosis present.  Vascular surgery took patient to the operating room on 10/04/2017 and performed a vistulagram which revealed a significant stenosis of the subclavian/inominate junction and angioplasty was performed.  The patient tolerated the procedure without difficulty.  He remains  on coumadin with most recent INR being 2.45.  He will be discharged on 1 mg of coumadin starting tomorrow.  The patient remains weak and continues to have slurred speech.  He also remains confused at times.  He is however at his baseline.  He is on dialysis per his outpatient regimen.  He continues to work with PT and they have recommended SNF placement.  He is medically stable for discharge today.                      Consults: nephrology, neurology and vascular surgery  Significant Diagnostic Studies: angiography:    Ost LM lesion is 65% stenosed.  Mid LM lesion is 90% stenosed.  Ost Cx to Prox Cx lesion is 100% stenosed.  Ost LAD lesion is 20% stenosed.  Acute Mrg lesion is 60% stenosed.   Significant coronary calcification with high-grade 65% ostial and 90-95% distal left main  stenosis, calcification of the proximal LAD with 20-25% narrowing, large ramus intermediate vessel; total occlusion of the left circumflex at the ostium.  Dominant RCA with 60% ostial proximal narrowing in an RV marginal branch and collateralization to the distal circumflex.  Treatments: surgery:   PROCEDURES:  Median sternotomy, extracorporeal circulation, Coronary artery bypass grafting x4      Left internal mammary artery to left anterior descending,      Sequential saphenous vein graft to ramus intermedius and obtuse marginal,      Saphenous vein graft to acute marginal, Endoscopic vein harvest from right thigh, Mitral valve repair with Edwards Physio II 32-mm annuloplasty ring (model #[id number], serial [id number]).  Left upper extremity shuntogram, angioplasty left innominate vein (14 x 4 Atlas)  Disposition: 01-Home or Self Care   Discharge Medications:  The patient has been discharged on:   1.Beta Blocker:  Yes [ x  ]                              No   [   ]                              If No, reason:  2.Ace Inhibitor/ARB: Yes [   ]                                     No  [ x   ]                                     If No, reason:ESRD  3.Statin:   Yes [x   ]                  No  [   ]                  If No, reason:  4.Ecasa:  Yes  [  x ]                  No   [   ]                  If No, reason:     Discharge Instructions    AMB Referral to [place] Management   Complete by:  As directed    Please assign member to [facility] LCSW while at [facility]. Was identified by inpatient case management MD Director as high risk for readmission. Was identified to have [facility] for their [place] (High Risk Initiative program) upon dc from [facility]. Patient currently hospitalized at [facility]. Goes to [facility] which makes him eligible for [facility]. Please call with questions. Marthenia Rolling, [place], RN,BSN-THN [facility] [phone]   Reason for consult:   Please assign to [facility] LCSW   Diagnoses of:  Kidney Failure   Expected date of contact:  1-3 days (reserved for hospital discharges)   Ambulatory referral to Neurology   Complete by:  As directed    An appointment is requested in approximately: 6-8 weeks   Discharge patient   Complete by:  As directed    After dialysis   Discharge disposition:  03-Skilled Nursing Facility   Discharge patient date:  10/07/2017     Allergies as of 10/07/2017   [name]     Medication List    STOP taking these medications   allopurinol 100 MG tablet Commonly known as:  ZYLOPRIM   clopidogrel 75 MG tablet Commonly known as:  PLAVIX   colchicine 0.6 MG tablet   insulin glargine 100 UNIT/ML injection Commonly known as:  LANTUS   insulin lispro 100 UNIT/ML KiwkPen Commonly known as:  HUMALOG     TAKE these medications   amiodarone 200 MG tablet Commonly known as:  PACERONE Take 1 tablet (200 mg total) by mouth 2 (two) times daily.   amitriptyline 25 MG tablet Commonly known as:  ELAVIL TAKE ONE TABLET BY MOUTH AT BEDTIME FOR  NEUROPATHY   aspirin EC 81 MG tablet Take 1 tablet (81 mg total) by mouth daily.   atorvastatin 80 MG tablet Commonly known as:  LIPITOR Take 0.5 tablets (40 mg total) by mouth daily. What changed:  medication strength   cinacalcet 30 MG tablet Commonly known as:  SENSIPAR Take 4 tablets (120 mg total) by mouth daily with supper. What changed:    medication strength  how much to take  when to take this   Darbepoetin Alfa 100 MCG/0.5ML Sosy injection Commonly known as:  ARANESP Inject 0.5 mLs (100 mcg total) into the vein every Monday with hemodialysis.   doxercalciferol 4 MCG/2ML injection Commonly known as:  HECTOROL Inject 1 mL (2 mcg total) into the vein every Monday, Wednesday, and Friday with hemodialysis.   feeding supplement (NEPRO CARB STEADY) Liqd Take 237 mLs by mouth 2 (two) times daily between meals.   ferric gluconate 250 mg  in sodium chloride 0.9 % 100 mL Inject 250 mg into the vein every Monday, Wednesday, and Friday with hemodialysis.   insulin detemir 100 UNIT/ML injection Commonly known as:  LEVEMIR Inject 0.2 mLs (20 Units total) into the skin 2 (two) times daily.   levETIRAcetam 500 MG tablet Commonly known as:  KEPPRA Take 2 tablets (1,000 mg total) by mouth daily. Then take extra 500 mg (1 tablet) on MWF after HD   metoprolol tartrate 25 MG tablet Commonly known as:  LOPRESSOR Take 1 tablet (25 mg total) by mouth 2 (two) times daily. What changed:    how much to take  when to take this   multivitamin Tabs tablet Take 1 tablet by mouth daily.   sevelamer carbonate 800 MG tablet Commonly known as:  RENVELA Take 3 tablets (2,400 mg total) by mouth 3 (three) times daily with  meals. What changed:  Another medication with the same name was removed. Continue taking this medication, and follow the directions you see here.   warfarin 1 MG tablet Commonly known as:  COUMADIN Take 1 tablet (1 mg total) by mouth daily. As directed to keep INR between 2.0 and 2.5      Follow-up Information    Pieter Partridge, DO. Schedule an appointment as soon as possible for a visit in 6 week(s).   Specialty:  Neurology Contact information: [address] [place] [address] [phone]        Burtis Junes, NP Follow up on 10/23/2017.   Specialties:  Nurse Practitioner, Interventional Cardiology, Cardiology, Radiology Why:  Appointment is at 11 Contact information: [place] [place] [address] [phone]          1. Please obtain vital signs at least one time daily 2.Please weigh the patient daily. If he or she continues to gain weight or develops lower extremity edema, contact the office at [phone]. 3. Ambulate patient at least three times daily and please use sternal precautions. 4 check INR till stable dose of coumadin - wanting 2-2.5    Signed: Vann Giovanni 10/07/2017, 7:43 AM

## 2017-10-04 NOTE — Progress Notes (Signed)
SLP Cancellation Note  Patient Details Name: Ernest [name] MRN: [id number] DOB: [birth year]   Cancelled treatment:       Reason Eval/Treat Not Completed: Patient at procedure or test/unavailable Pt off unit in OR, will f/u once pt is returned to room and available for treatment     Emilio Math 10/04/2017, 11:59 AM

## 2017-10-04 NOTE — Progress Notes (Addendum)
[place] for warfarin Indication: atrial fibrillation   Assessment: 69 yom s/p CABG, MV repair on 09/14/17. Warfarin was started per CVTS 12/12 for afib, then held by Vascular for fistula placement 12/21. Pharmacy asked to resume warfarin per Dr. Oneida Alar at [place]. Last INR 2.54 on 12/19 - unable to draw INR x 2 days due to difficult stick. CBC stable on 12/19. No bleed documented. Noted DDI with amiodarone.  Goal of Therapy:  INR 2-3 Monitor platelets by anticoagulation protocol: Yes   Plan:  Per discussion with CVTS PA Gold - hold warfarin tonight and try to get INR in HD   Elicia Lamp, PharmD, BCPS Clinical Pharmacist 10/04/2017 12:11 PM

## 2017-10-04 NOTE — Progress Notes (Signed)
Patient up most of the night. Surgery at 0900. Easily redirected. Vitals stable.

## 2017-10-04 NOTE — Progress Notes (Signed)
Admit: 09/07/2017 LOS: 14  66M ESRD MWF [place] s/p 4V CABG  Subjective:  Fgram with central stenosis, rec PTA, can use AVG Pt w/o complaints, mental status about the same as yesterday No new events  12/20 0701 - 12/21 0700 In: 483 [P.O.:480; I.V.:3] Out: -   Filed Weights   10/02/17 1115 10/03/17 0501 10/04/17 0315  Weight: 97.7 kg (215 lb 6.2 oz) 98.6 kg (217 lb 6 oz) 98.2 kg (216 lb 7.9 oz)    Scheduled Meds: . amiodarone  200 mg Per NG tube BID  . amitriptyline  25 mg Oral QHS  . aspirin  81 mg Oral Daily  . atorvastatin  80 mg Oral q1800  . bisacodyl  10 mg Oral Daily   Or  . bisacodyl  10 mg Rectal Daily  . chlorhexidine gluconate (MEDLINE KIT)  15 mL Mouth Rinse BID  . cinacalcet  120 mg Oral Q supper  . clopidogrel  75 mg Oral Daily  . [START ON 10/07/2017] doxercalciferol  2 mcg Intravenous Q M,W,F-HD  . feeding supplement (NEPRO CARB STEADY)  237 mL Oral BID BM  . [START ON 10/05/2017] Influenza vac split quadrivalent PF  0.5 mL Intramuscular Tomorrow-1000  . insulin aspart  0-5 Units Subcutaneous QHS  . insulin aspart  0-9 Units Subcutaneous TID WC  . insulin detemir  20 Units Subcutaneous BID  . levETIRAcetam  1,000 mg Per Tube Daily  . levETIRAcetam  500 mg Per Tube Q M,W,F-HD  . metoprolol tartrate  25 mg Oral BID  . moving right along book   Does not apply Once  . multivitamin  1 tablet Oral QHS  . pantoprazole sodium  40 mg Per Tube Daily  . sevelamer carbonate  2,400 mg Oral TID WC  . sodium chloride flush  3 mL Intravenous Q12H   Continuous Infusions: . sodium chloride    . lactated ringers     PRN Meds:.sodium chloride, ondansetron (ZOFRAN) IV, RESOURCE THICKENUP CLEAR, sodium chloride flush  Current Labs: reviewed    Physical Exam:  Blood pressure (!) 131/30, pulse 81, temperature 98.6 F (37 C), temperature source Oral, resp. rate (!) 0, height '5\' 9"'  (1.753 m), weight 98.2 kg (216 lb 7.9 oz), SpO2 94 %. No LEE Swollen  LUE RRR CTAB S/nt/nd  Dialysis prescription: [place] MWF 4.25 hours 2K2Ca EDW 95.5 kg Venofer 50/week Calcitriol 0.75 TIW with HD No anticoagulation LUE AVG  A 1. ESRD MWF [place] via AVF 2. 4V CABG 09/13/17 3. Post surgery AMS; waxing/waning; still not as good as I know him to be from outpt HD 4. ANemia of CKD, not on ESA here 5. DM2 6. Hx/o CVA 7. Debility, for SNF at discharge 8. LUE AVG for eval tomorrow with VVS 9. CKD BMD, sensipar 192m/d; hectorol 236m qTx. Sevelmer 2400 qAC  P 1. Cont HD MWF, 2K, AVG 400/800, 4h, 3L UF, hold heparin 2. Start Aranesp 100 qFri, check Fe stores 3. Will check INR at HD tomorrow    RyPearson GrippeD 10/04/2017, 1:32 PM  Recent Labs  Lab 09/30/17 0308 10/01/17 0424 10/02/17 0718  NA 131* 134* 133*  K 4.6 4.3 4.3  CL 91* 94* 93*  CO2 '25 28 25  ' GLUCOSE 180* 181* 122*  BUN 91* 59* 77*  CREATININE 7.33* 5.82* 7.65*  CALCIUM 9.0 8.7* 8.5*  PHOS  --   --  5.8*   Recent Labs  Lab 09/30/17 0308 10/01/17 0424 10/02/17 0730  WBC 15.3* 11.8*  10.6*  HGB 8.9* 8.6* 8.3*  HCT 26.3* 25.4* 24.8*  MCV 81.7 81.7 82.7  PLT 448* 412* 413*

## 2017-10-04 NOTE — Progress Notes (Signed)
Unable to draw lab. Patient is a hard stick. Will draw at Dialysis.

## 2017-10-04 NOTE — Progress Notes (Signed)
PT Cancellation Note  Patient Details Name: BRAYEN BUNN MRN: [id number] DOB: 1946/02/02   Cancelled Treatment:    Reason Eval/Treat Not Completed: (P) Patient at procedure or test/unavailable(Pt off unit for AV fistulogram in OR, will f/u pending patient return to room and presentation post procedure.)   Zowie Lundahl Eli Hose 10/04/2017, 10:49 AM  Governor Rook[place], PTA pager [phone]

## 2017-10-04 NOTE — Progress Notes (Signed)
OT Cancellation Note  Patient Details Name: Ernest [name] MRN: [id number] DOB: 10/22/1945   Cancelled Treatment:    Reason Eval/Treat Not Completed: Patient at procedure or test/ unavailable. PT in OR for A/V FISTULAGRAM. OT will continue to follow for treatment session.   Merri Ray Cahterine Heinzel 10/04/2017, 10:24 AM  Hulda Humphrey OTR/L [phone]

## 2017-10-04 NOTE — Consult Note (Addendum)
   [facility] Inpatient Consult   10/04/2017  Ernest [name] [birth year] [id number]   [facility] Care Management follow up.   Spoke with inpatient RNCM who indicates discharge plan remains the same for SNF at discharge. Patient goes to [facility] which makes him eligible for [place] Management services.   Attempted to contact his wife, as Mr. Krise appears slightly confused. Left voicemail message for Mrs. Krass to request call back to discuss [place] Management program follow up while at [facility].   Inpatient case management MD Director advised that [place] Management follow Mr. Giannelli while at [facility]. Mr. Ganim was identified for [place] with [facility] post SNF discharge. Will make referral for [facility] LCSW. It appears Mr. Duque will go to [facility].   [facility] Care Management packet left at bedside.  Made inpatient RNCM aware of attempt to reach wife. However, will make referral to [facility] LCSW for follow up while at SNF per Dr. Mickey Farber (MD Director) request.    Marthenia Rolling, MSN-Ed, RN,BSN [facility] Liaison [phone]

## 2017-10-04 NOTE — Interval H&P Note (Signed)
History and Physical Interval Note:  10/04/2017 8:42 AM  Ernest [name]  has presented today for surgery, with the diagnosis of pvd  The various methods of treatment have been discussed with the patient and family. After consideration of risks, benefits and other options for treatment, the patient has consented to  Procedure(s): A/V FISTULAGRAM (N/A) as a surgical intervention .  The patient's history has been reviewed, patient examined, no change in status, stable for surgery.  I have reviewed the patient's chart and labs.  Questions were answered to the patient's satisfaction.     Ruta Hinds

## 2017-10-04 NOTE — Progress Notes (Signed)
    [address]       [place] [address]             [phone]      21 Days Post-Op Procedure(s) (LRB): CORONARY ARTERY BYPASS GRAFTING (CABG) times four LIMA  to LAD, SVG sequentially to OM and RAMUS Intermediate and SVG to Acute Marginal (N/A) TRANSESOPHAGEAL ECHOCARDIOGRAM (TEE) (N/A) MITRAL VALVE REPAIR (MVR) (N/A) Subjective: "tired of laying down", left arm is sore  Objective: Vital signs in last 24 hours: Temp:  [98 F (36.7 C)-98.9 F (37.2 C)] 98.6 F (37 C) (12/21 0315) Pulse Rate:  [79] 79 (12/21 0315) Cardiac Rhythm: Normal sinus rhythm (12/21 0315) Resp:  [11-20] 16 (12/21 0315) BP: (102-141)/(58-81) 132/68 (12/21 0315) SpO2:  [91 %-95 %] 94 % (12/21 0315) Weight:  [216 lb 7.9 oz (98.2 kg)] 216 lb 7.9 oz (98.2 kg) (12/21 0315)  Hemodynamic parameters for last 24 hours:    Intake/Output from previous day: 12/20 0701 - 12/21 0700 In: 483 [P.O.:480; I.V.:3] Out: -  Intake/Output this shift: No intake/output data recorded.  General appearance: alert, cooperative, distracted and no distress Heart: regular rate and rhythm Lungs: coarse BS, improves somewhat with cough Abdomen: soft, nontender Extremities: no sign edema Wound: incis healing well  Lab Results: Recent Labs    10/02/17 0730  WBC 10.6*  HGB 8.3*  HCT 24.8*  PLT 413*   BMET:  Recent Labs    10/02/17 0718  NA 133*  K 4.3  CL 93*  CO2 25  GLUCOSE 122*  BUN 77*  CREATININE 7.65*  CALCIUM 8.5*    PT/INR:  Recent Labs    10/02/17 0234  LABPROT 27.2*  INR 2.54   ABG    Component Value Date/Time   PHART 7.427 09/15/2017 0320   HCO3 24.3 09/15/2017 0320   TCO2 26 09/14/2017 1645   ACIDBASEDEF 4.0 (H) 09/14/2017 0153   O2SAT 75.0 09/20/2017 0805   CBG (last 3)  Recent Labs    10/03/17 1646 10/03/17 2104 10/04/17 0558  GLUCAP 114* 131* 71    Meds Scheduled Meds: . amiodarone  200 mg Per NG tube BID  . amitriptyline  25 mg Oral QHS  . aspirin   81 mg Oral Daily  . atorvastatin  80 mg Oral q1800  . bisacodyl  10 mg Oral Daily   Or  . bisacodyl  10 mg Rectal Daily  . chlorhexidine gluconate (MEDLINE KIT)  15 mL Mouth Rinse BID  . cinacalcet  120 mg Oral Q supper  . clopidogrel  75 mg Oral Daily  . doxercalciferol  2 mcg Intravenous Q M,W,F-HD  . feeding supplement (NEPRO CARB STEADY)  237 mL Oral BID BM  . Influenza vac split quadrivalent PF  0.5 mL Intramuscular Tomorrow-1000  . insulin aspart  0-5 Units Subcutaneous QHS  . insulin aspart  0-9 Units Subcutaneous TID WC  . insulin detemir  20 Units Subcutaneous BID  . levETIRAcetam  1,000 mg Per Tube Daily  . levETIRAcetam  500 mg Per Tube Q M,W,F-HD  . mouth rinse  15 mL Mouth Rinse QID  . metoprolol tartrate  25 mg Oral BID  . moving right along book   Does not apply Once  . multivitamin  1 tablet Oral QHS  . pantoprazole sodium  40 mg Per Tube Daily  . sevelamer carbonate  2,400 mg Oral TID WC  . sodium chloride flush  3 mL Intravenous Q12H   Continuous Infusions: .   sodium chloride    .  ceFAZolin (ANCEF) IV    . lactated ringers 20 mL/hr at 09/13/17 1600  . magnesium sulfate     PRN Meds:.sodium chloride, ondansetron (ZOFRAN) IV, RESOURCE THICKENUP CLEAR, sodium chloride flush  Xrays No results found.  Assessment/Plan: S/P Procedure(s) (LRB): CORONARY ARTERY BYPASS GRAFTING (CABG) times four LIMA  to LAD, SVG sequentially to OM and RAMUS Intermediate and SVG to Acute Marginal (N/A) TRANSESOPHAGEAL ECHOCARDIOGRAM (TEE) (N/A) MITRAL VALVE REPAIR (MVR) (N/A)  1 hemodyn stable in sinus, no fevers 2 no new labs 3 sugars adeq controlled 4 cont pulm toilet- rehab as able, SNF at discharge 5 fistulogram per VVS tooday 6 nephrology managing dialysis   LOS: 26 days     E  10/04/2017  

## 2017-10-04 NOTE — Op Note (Signed)
Procedure: Left upper extremity shuntogram, angioplasty left innominate vein (14 x 4 Atlas)  Preoperative diagnosis: Left arm swelling.  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  The patient was placed in supine position Ehinger table.  The patient's left upper extremity was prepped and draped in usual sterile fashion.  Local anesthesia was infiltrated over the proximal third of the graft.  Ultrasound was used to identify the graft and a micropuncture needle was used to cannulate the graft and the micropuncture needle advanced into the graft under fluoroscopic guidance.  The micropuncture sheath was then placed over the guidewire.  Contrast angiogram was then obtained which shows a patent superior vena cava left internal jugular and left subclavian vein.  There is a 90% stenosis at the junction of the innominate subclavian jugular vein.  The remainder of the graft is patent although some irregularity from graft degeneration.  With the balloon inflated and the graft were able to reflux contrast across the arterial anastomosis which is also widely patent.  At this point we decided to intervene on the innominate stenosis.  The patient was given 5000 units of intravenous heparin.  A micropuncture sheath was exchanged over an 035 Bentson wire for a 7 French short sheath.  We initially predilated the lesion using a 8 x 4 Mustang angioplasty balloon inflated to nominal pressure for 1 minute.  I then used a 12 x 4 Mustang balloon inflated to nominal pressure for 1 minute.  There was still some residual waist.  We then took a 14 x 4 Atlas balloon and inflated this to nominal pressure.  The patient did have some mild chest pain during this portion of the procedure.  The balloon was deflated.  Contrast angiogram was  performed which showed the innominate vein was now widely patent.  The patient's chest pain had subsided.  There was no extravasation of contrast.  A U stitch was placed with a 3-0 Monocryl at the entry site.  The sheath was removed and hemostasis obtained with direct pressure.  The patient tolerated the procedure well and there were no complications.  The patient was taken to the holding area in stable condition.  Operative management: The graft is ready for use.  The patient's central vein stenosis would be amenable to repeat angioplasty if he has recurrence of symptoms.  Ruta Hinds, MD [facility] Office: [phone] Pager: [phone]

## 2017-10-05 LAB — IRON AND TIBC
IRON: 20 ug/dL — AB (ref 45–182)
Saturation Ratios: 10 % — ABNORMAL LOW (ref 17.9–39.5)
TIBC: 193 ug/dL — ABNORMAL LOW (ref 250–450)
UIBC: 173 ug/dL

## 2017-10-05 LAB — PROTIME-INR
INR: 2.75
Prothrombin Time: 28.9 seconds — ABNORMAL HIGH (ref 11.4–15.2)

## 2017-10-05 LAB — CBC
HCT: 25.2 % — ABNORMAL LOW (ref 39.0–52.0)
HEMOGLOBIN: 8.6 g/dL — AB (ref 13.0–17.0)
MCH: 27.6 pg (ref 26.0–34.0)
MCHC: 34.1 g/dL (ref 30.0–36.0)
MCV: 80.8 fL (ref 78.0–100.0)
Platelets: 401 10*3/uL — ABNORMAL HIGH (ref 150–400)
RBC: 3.12 MIL/uL — AB (ref 4.22–5.81)
RDW: 15.7 % — ABNORMAL HIGH (ref 11.5–15.5)
WBC: 10 10*3/uL (ref 4.0–10.5)

## 2017-10-05 LAB — GLUCOSE, CAPILLARY
GLUCOSE-CAPILLARY: 46 mg/dL — AB (ref 65–99)
GLUCOSE-CAPILLARY: 200 mg/dL — AB (ref 65–99)
Glucose-Capillary: 82 mg/dL (ref 65–99)
Glucose-Capillary: 156 mg/dL — ABNORMAL HIGH (ref 65–99)

## 2017-10-05 LAB — RENAL FUNCTION PANEL
Albumin: 2.1 g/dL — ABNORMAL LOW (ref 3.5–5.0)
Anion gap: 12 (ref 5–15)
BUN: 57 mg/dL — ABNORMAL HIGH (ref 6–20)
CALCIUM: 8.4 mg/dL — AB (ref 8.9–10.3)
CHLORIDE: 95 mmol/L — AB (ref 101–111)
CO2: 27 mmol/L (ref 22–32)
Creatinine, Ser: 8.27 mg/dL — ABNORMAL HIGH (ref 0.61–1.24)
GFR, EST AFRICAN AMERICAN: 7 mL/min — AB (ref >60)
GFR, EST NON AFRICAN AMERICAN: 6 mL/min — AB (ref >60)
Glucose, Bld: 83 mg/dL (ref 65–99)
Phosphorus: 5 mg/dL — ABNORMAL HIGH (ref 2.5–4.6)
Potassium: 4.2 mmol/L (ref 3.5–5.1)
Sodium: 134 mmol/L — ABNORMAL LOW (ref 135–145)

## 2017-10-05 LAB — FERRITIN: FERRITIN: 1170 ng/mL — AB (ref 24–336)

## 2017-10-05 MED ORDER — DOXERCALCIFEROL 4 MCG/2ML IV SOLN
INTRAVENOUS | Status: AC
  Filled 2017-10-05: qty 2

## 2017-10-05 NOTE — Progress Notes (Addendum)
      [address]       [place] [address]             [phone]      1 Day Post-Op Procedure(s) (LRB): A/V FISTULAGRAM (N/A) PERIPHERAL VASCULAR BALLOON ANGIOPLASTY (Left)   Subjective:  Mr. Suski states he is doing well.  He is undergoing dialysis this morning.    Objective: Vital signs in last 24 hours: Temp:  [97.8 F (36.6 C)-98.5 F (36.9 C)] 98 F (36.7 C) (12/22 0604) Pulse Rate:  [77-139] 82 (12/22 0830) Cardiac Rhythm: Normal sinus rhythm (12/22 0600) Resp:  [0-28] 14 (12/22 0700) BP: (107-137)/(30-84) 122/59 (12/22 0830) SpO2:  [85 %-98 %] 96 % (12/22 0630) Weight:  [213 lb 6.5 oz (96.8 kg)-213 lb 13.5 oz (97 kg)] 213 lb 13.5 oz (97 kg) (12/22 0630)  Intake/Output from previous day: 12/21 0701 - 12/22 0700 In: 343 [P.O.:340; I.V.:3] Out: -   General appearance: alert, cooperative and no distress Heart: regular rate and rhythm Lungs: clear to auscultation bilaterally Abdomen: soft, non-tender; bowel sounds normal; no masses,  no organomegaly Extremities: edema trace Wound: clean and dr  Lab Results: Recent Labs    10/05/17 0615  WBC 10.0  HGB 8.6*  HCT 25.2*  PLT 401*   BMET:  Recent Labs    10/05/17 0613  NA 134*  K 4.2  CL 95*  CO2 27  GLUCOSE 83  BUN 57*  CREATININE 8.27*  CALCIUM 8.4*    PT/INR:  Recent Labs    10/05/17 0304  LABPROT 28.9*  INR 2.75   ABG    Component Value Date/Time   PHART 7.427 09/15/2017 0320   HCO3 24.3 09/15/2017 0320   TCO2 26 09/14/2017 1645   ACIDBASEDEF 4.0 (H) 09/14/2017 0153   O2SAT 75.0 09/20/2017 0805   CBG (last 3)  Recent Labs    10/04/17 1238 10/04/17 1626 10/04/17 2131  GLUCAP 83 221* 142*    Assessment/Plan: S/P Procedure(s) (LRB): A/V FISTULAGRAM (N/A) PERIPHERAL VASCULAR BALLOON ANGIOPLASTY (Left)  1. CV- hemodynamically stable, NSR 2. Pulm- no acute issues, continue IS 3. Renal- ESRD, dialysis today, nephrology following 4. S/P angioplasty by  vascular surgery yesterday 5. Deconditioning- SNF at discharge 6. Dispo- patient stable, continue current care   LOS: 27 days    Ellwood Handler 10/05/2017 Patient seen and examined, agree with above To SNF when arrangements can be made  Linnell Camp. Roxan Hockey, MD [facility] [phone]

## 2017-10-05 NOTE — Progress Notes (Signed)
Subjective  - POD #1, s/p venous PTA  He says the swelling in his left arm is better   Physical Exam:    Tolerating dialysis  left arm edema persists.    Assessment/Plan:  POD #1  The patient states that his swelling feels better following angioplasty of the left innominate vein.  I told him the swelling will persist for some time but should improve over the next few weeks.  Please contact us with any new concerns or questions.  Ernest [name] 10/05/2017 10:57 AM --  Vitals:   10/05/17 0930 10/05/17 1004  BP: (!) 128/57 (!) 119/57  Pulse: 76 80  Resp:  18  Temp:  98 F (36.7 C)  SpO2:  96%    Intake/Output Summary (Last 24 hours) at 10/05/2017 1057 Last data filed at 10/05/2017 1004 Gross per 24 hour  Intake 343 ml  Output 3000 ml  Net -2657 ml     Laboratory CBC    Component Value Date/Time   WBC 10.0 10/05/2017 0615   HGB 8.6 (L) 10/05/2017 0615   HCT 25.2 (L) 10/05/2017 0615   PLT 401 (H) 10/05/2017 0615    BMET    Component Value Date/Time   NA 134 (L) 10/05/2017 0613   K 4.2 10/05/2017 0613   CL 95 (L) 10/05/2017 0613   CO2 27 10/05/2017 0613   GLUCOSE 83 10/05/2017 0613   BUN 57 (H) 10/05/2017 0613   CREATININE 8.27 (H) 10/05/2017 0613   CALCIUM 8.4 (L) 10/05/2017 0613   GFRNONAA 6 (L) 10/05/2017 0613   GFRAA 7 (L) 10/05/2017 0613    COAG Lab Results  Component Value Date   INR 2.75 10/05/2017   INR 2.54 10/02/2017   INR 2.07 10/01/2017   No results found for: PTT  Antibiotics Anti-infectives (From admission, onward)   Start     Dose/Rate Route Frequency Ordered Stop   10/04/17 0600  ceFAZolin (ANCEF) IVPB 2g/100 mL premix  Status:  Discontinued     2 g 200 mL/hr over 30 Minutes Intravenous On call to O.R. 10/03/17 1548 10/04/17 1116   09/14/17 1300  cefUROXime (ZINACEF) 1.5 g in dextrose 5 % 50 mL IVPB     1.5 g 100 mL/hr over 30 Minutes Intravenous Every 24 hours 09/13/17 1748 09/15/17 1355   09/13/17 2200  cefUROXime  (ZINACEF) 1.5 g in dextrose 5 % 50 mL IVPB  Status:  Discontinued     1.5 g 100 mL/hr over 30 Minutes Intravenous Every 12 hours 09/13/17 1436 09/13/17 1746   09/13/17 2000  vancomycin (VANCOCIN) IVPB 1000 mg/200 mL premix     1,000 mg 200 mL/hr over 60 Minutes Intravenous  Once 09/13/17 1436 09/13/17 2054   09/13/17 1400  cefUROXime (ZINACEF) 1.5 g in dextrose 5 % 50 mL IVPB  Status:  Discontinued     1.5 g 100 mL/hr over 30 Minutes Intravenous Every 24 hours 09/13/17 1746 09/13/17 1748   09/13/17 0400  vancomycin (VANCOCIN) 1,500 mg in sodium chloride 0.9 % 250 mL IVPB     1,500 mg 125 mL/hr over 120 Minutes Intravenous To Surgery 09/12/17 1322 09/13/17 1537   09/13/17 0400  cefUROXime (ZINACEF) 1.5 g in dextrose 5 % 50 mL IVPB     1.5 g 100 mL/hr over 30 Minutes Intravenous To Surgery 09/12/17 1322 09/13/17 1544   09/13/17 0400  cefUROXime (ZINACEF) 750 mg in dextrose 5 % 50 mL IVPB  Status:  Discontinued     750 mg  100 mL/hr over 30 Minutes Intravenous To Surgery 09/12/17 1322 09/13/17 1436       V. Leia Alf, M.D. [facility] Office: [phone] Pager:  [phone]

## 2017-10-05 NOTE — Progress Notes (Signed)
CSW following patient for disposition needs. Patient has bed available at [facility] once patient is medically cleared.    Rhea Pink, MSW,  [place]

## 2017-10-05 NOTE — Procedures (Signed)
I was present at this dialysis session. I have reviewed the session itself and made appropriate changes.   Using AVG w/o difficulty.  Qb 400 UF 3L. 2K bath.  No c/o, pt stable. Labs pending.   Filed Weights   10/04/17 0315 10/05/17 0058 10/05/17 0429  Weight: 98.2 kg (216 lb 7.9 oz) 96.8 kg (213 lb 6.5 oz) 96.8 kg (213 lb 6.5 oz)    Recent Labs  Lab 10/02/17 0718  NA 133*  K 4.3  CL 93*  CO2 25  GLUCOSE 122*  BUN 77*  CREATININE 7.65*  CALCIUM 8.5*  PHOS 5.8*    Recent Labs  Lab 10/01/17 0424 10/02/17 0730 10/05/17 0615  WBC 11.8* 10.6* 10.0  HGB 8.6* 8.3* 8.6*  HCT 25.4* 24.8* 25.2*  MCV 81.7 82.7 80.8  PLT 412* 413* 401*    Scheduled Meds: . amiodarone  200 mg Per NG tube BID  . amitriptyline  25 mg Oral QHS  . aspirin  81 mg Oral Daily  . atorvastatin  80 mg Oral q1800  . bisacodyl  10 mg Oral Daily   Or  . bisacodyl  10 mg Rectal Daily  . chlorhexidine gluconate (MEDLINE KIT)  15 mL Mouth Rinse BID  . cinacalcet  120 mg Oral Q supper  . clopidogrel  75 mg Oral Daily  . [START ON 10/07/2017] doxercalciferol  2 mcg Intravenous Q M,W,F-HD  . feeding supplement (NEPRO CARB STEADY)  237 mL Oral BID BM  . Influenza vac split quadrivalent PF  0.5 mL Intramuscular Tomorrow-1000  . insulin aspart  0-5 Units Subcutaneous QHS  . insulin aspart  0-9 Units Subcutaneous TID WC  . insulin detemir  20 Units Subcutaneous BID  . levETIRAcetam  1,000 mg Per Tube Daily  . levETIRAcetam  500 mg Per Tube Q M,W,F-HD  . metoprolol tartrate  25 mg Oral BID  . multivitamin  1 tablet Oral QHS  . pantoprazole sodium  40 mg Per Tube Daily  . sevelamer carbonate  2,400 mg Oral TID WC  . sodium chloride flush  3 mL Intravenous Q12H   Continuous Infusions: . sodium chloride    . lactated ringers     PRN Meds:.sodium chloride, ondansetron (ZOFRAN) IV, RESOURCE THICKENUP CLEAR, sodium chloride flush   Pearson Grippe  MD 10/05/2017, 6:39 AM

## 2017-10-05 NOTE — Progress Notes (Signed)
[facility] for warfarin Indication: atrial fibrillation   Assessment: 7 yom s/p CABG, MV repair on 09/14/17. Warfarin was started per CVTS 12/12 for afib, then held by Vascular for fistula placement 12/21. Pharmacy asked to resume warfarin per Dr. Oneida Alar on 12/21, but per discussion with CVTS PA Gold, hold warfarin 12/21 and try to get INR in HD. Last INR 2.54 on 12/19 - unable to draw INR x 2 days due to difficult stick.   INR drawn in HD this AM came back at 2.75 - odd since warfarin has been held x 3 days.  CBC stable. No bleed documented. Noted DDI with amiodarone.  Goal of Therapy:  INR 2-3 Monitor platelets by anticoagulation protocol: Yes   Plan:  -Spoke with CVTS PA Barrett - wants to continue holding warfarin tonight due to desired lower goal in this patient, also will discuss plan for triple therapy vs. d/c Plavix or asa with MD -Monitor daily INR, CBC, s/sx bleeding  Elicia Lamp, PharmD, BCPS Clinical Pharmacist 10/05/2017 12:12 PM

## 2017-10-06 LAB — PTH, INTACT AND CALCIUM
Calcium, Total (PTH): 8.4 mg/dL — ABNORMAL LOW (ref 8.6–10.2)
PTH: 63 pg/mL (ref 15–65)

## 2017-10-06 LAB — GLUCOSE, CAPILLARY
GLUCOSE-CAPILLARY: 161 mg/dL — AB (ref 65–99)
GLUCOSE-CAPILLARY: 113 mg/dL — AB (ref 65–99)
GLUCOSE-CAPILLARY: 59 mg/dL — AB (ref 65–99)
Glucose-Capillary: 148 mg/dL — ABNORMAL HIGH (ref 65–99)
Glucose-Capillary: 83 mg/dL (ref 65–99)
Glucose-Capillary: 93 mg/dL (ref 65–99)

## 2017-10-06 LAB — PROTIME-INR
INR: 2.56
Prothrombin Time: 27.3 seconds — ABNORMAL HIGH (ref 11.4–15.2)

## 2017-10-06 MED ORDER — SODIUM CHLORIDE 0.9 % IV SOLN
250.0000 mg | INTRAVENOUS | Status: DC
  Filled 2017-10-06: qty 20

## 2017-10-06 MED ORDER — DARBEPOETIN ALFA 100 MCG/0.5ML IJ SOSY
100.0000 ug | PREFILLED_SYRINGE | INTRAMUSCULAR | Status: DC
  Filled 2017-10-06: qty 0.5

## 2017-10-06 NOTE — Progress Notes (Signed)
Patient pulled out another IV. Dr. Roxan Hockey paged. Per MD do not replace.

## 2017-10-06 NOTE — Progress Notes (Signed)
Admit: 09/07/2017 LOS: 95  89M ESRD MWF [place] s/p 4V CABG  Subjective:  HD yesterday: post weight 94kg, 3L UF; tolerated well; full Treatment Looking for SNF Due for HD today but since last HD was pushed into next day will do HD 12/24 and then 12/26 No c/o; pt arm with improving swelling  12/22 0701 - 12/23 0700 In: 243 [P.O.:240; I.V.:3] Out: 3000   Filed Weights   10/05/17 0630 10/05/17 1004 10/06/17 0405  Weight: 97 kg (213 lb 13.5 oz) 94 kg (207 lb 3.7 oz) 95.2 kg (209 lb 14.1 oz)    Scheduled Meds: . amiodarone  200 mg Per NG tube BID  . amitriptyline  25 mg Oral QHS  . aspirin  81 mg Oral Daily  . atorvastatin  80 mg Oral q1800  . bisacodyl  10 mg Oral Daily   Or  . bisacodyl  10 mg Rectal Daily  . chlorhexidine gluconate (MEDLINE KIT)  15 mL Mouth Rinse BID  . cinacalcet  120 mg Oral Q supper  . clopidogrel  75 mg Oral Daily  . [START ON 10/07/2017] doxercalciferol  2 mcg Intravenous Q M,W,F-HD  . feeding supplement (NEPRO CARB STEADY)  237 mL Oral BID BM  . Influenza vac split quadrivalent PF  0.5 mL Intramuscular Tomorrow-1000  . insulin aspart  0-5 Units Subcutaneous QHS  . insulin aspart  0-9 Units Subcutaneous TID WC  . insulin detemir  20 Units Subcutaneous BID  . levETIRAcetam  1,000 mg Per Tube Daily  . levETIRAcetam  500 mg Per Tube Q M,W,F-HD  . metoprolol tartrate  25 mg Oral BID  . multivitamin  1 tablet Oral QHS  . pantoprazole sodium  40 mg Per Tube Daily  . sevelamer carbonate  2,400 mg Oral TID WC  . sodium chloride flush  3 mL Intravenous Q12H   Continuous Infusions: . sodium chloride    . lactated ringers     PRN Meds:.sodium chloride, ondansetron (ZOFRAN) IV, RESOURCE THICKENUP CLEAR, sodium chloride flush  Current Labs: reviewed    Physical Exam:  Blood pressure (!) 121/54, pulse 75, temperature 98.4 F (36.9 C), temperature source Oral, resp. rate 12, height '5\' 9"'  (1.753 m), weight 95.2 kg (209 lb 14.1 oz), SpO2 99 %. No  LEE Swollen LUE lessened; +B/T of AVG RRR CTAB S/nt/nd  Dialysis prescription: [place] MWF 4.25 hours 2K2Ca EDW 95.5 kg Venofer 50/week Calcitriol 0.75 TIW with HD No anticoagulation LUE AVG  A 1. ESRD MWF [place] via AVF 2. 4V CABG 09/13/17 3. Post surgery AMS; waxing/waning; still not as good as I know him to be from outpt HD 4. ANemia of CKD; 12/22 TSAT 10%, Ferritin 1170 5. DM2 6. Hx/o CVA 7. Debility, for SNF at discharge 8. LUE AVG for eval tomorrow with VVS 9. CKD BMD, sensipar 198m/d; hectorol 237m qTx. Sevelmer 2400 qAC  P 1. Cont HD MWF, 2K, AVG 400/800, 4h, 3L UF, hold heparin 2. Start Aranesp 100 qMon, Half load with IV Iron next 2 Tx 3. Next HD 12/24 and 12/26    Ernest [name] 10/06/2017, 8:54 AM  Recent Labs  Lab 10/01/17 0424 10/02/17 0718 10/05/17 0613  NA 134* 133* 134*  K 4.3 4.3 4.2  CL 94* 93* 95*  CO2 '28 25 27  ' GLUCOSE 181* 122* 83  BUN 59* 77* 57*  CREATININE 5.82* 7.65* 8.27*  CALCIUM 8.7* 8.5* 8.4*  PHOS  --  5.8* 5.0*   Recent Labs  Lab  10/01/17 0424 10/02/17 0730 10/05/17 0615  WBC 11.8* 10.6* 10.0  HGB 8.6* 8.3* 8.6*  HCT 25.4* 24.8* 25.2*  MCV 81.7 82.7 80.8  PLT 412* 413* 401*

## 2017-10-06 NOTE — Progress Notes (Addendum)
      [address]       [place] [address]             [phone]      2 Days Post-Op Procedure(s) (LRB): A/V FISTULAGRAM (N/A) PERIPHERAL VASCULAR BALLOON ANGIOPLASTY (Left)   Subjective:  Denies complaints.   Objective: Vital signs in last 24 hours: Temp:  [97 F (36.1 C)-98.8 F (37.1 C)] 97 F (36.1 C) (12/23 0405) Pulse Rate:  [73-82] 79 (12/23 0405) Cardiac Rhythm: Atrial flutter (12/23 0700) Resp:  [14-19] 18 (12/23 0405) BP: (97-135)/(50-59) 135/54 (12/23 0405) SpO2:  [96 %-100 %] 97 % (12/23 0405) Weight:  [207 lb 3.7 oz (94 kg)-209 lb 14.1 oz (95.2 kg)] 209 lb 14.1 oz (95.2 kg) (12/23 0405)  Intake/Output from previous day: 12/22 0701 - 12/23 0700 In: 243 [P.O.:240; I.V.:3] Out: 3000   General appearance: alert, cooperative and no distress Heart: regularly irregular rhythm Lungs: clear to auscultation bilaterally Abdomen: soft, non-tender; bowel sounds normal; no masses,  no organomegaly Extremities: extremities normal, atraumatic, no cyanosis or edema Wound: clean and dry  Lab Results: Recent Labs    10/05/17 0615  WBC 10.0  HGB 8.6*  HCT 25.2*  PLT 401*   BMET:  Recent Labs    10/05/17 0613  NA 134*  K 4.2  CL 95*  CO2 27  GLUCOSE 83  BUN 57*  CREATININE 8.27*  CALCIUM 8.4*    PT/INR:  Recent Labs    10/06/17 0300  LABPROT 27.3*  INR 2.56   ABG    Component Value Date/Time   PHART 7.427 09/15/2017 0320   HCO3 24.3 09/15/2017 0320   TCO2 26 09/14/2017 1645   ACIDBASEDEF 4.0 (H) 09/14/2017 0153   O2SAT 75.0 09/20/2017 0805   CBG (last 3)  Recent Labs    10/05/17 2116 10/06/17 0411 10/06/17 0642  GLUCAP 200* 161* 113*    Assessment/Plan: S/P Procedure(s) (LRB): A/V FISTULAGRAM (N/A) PERIPHERAL VASCULAR BALLOON ANGIOPLASTY (Left)  1. CV- hemodynamically stable, in rate controlled A. Flutter this morning- continue Amiodarone, Lopressor 2. INR 2.56, has not received coumadin since 12/19-- will  discuss dosing with Dr. Roxan Hockey 3. Pulm- no acute issues, continue IS 4. Renal- ESRD, dialyzed yesterday, Nephrology following, next dialysis for Tuesday 5. Deconditioning- needs SNF, has a bed [facility] 6. Dispo- patient stable, [facility] a bed [facility], will contact social to see if patient can be discharged today, otherwise will plan to d/c to SNF tomorrow   LOS: 28 days    Ellwood Handler 10/06/2017 Patient seen and examined, agree with above Goal INR 2-2.5, still elevated with coumadin on hold HD tomorrow then to SNF  Korrina Zern C. Roxan Hockey, MD [facility] [phone]

## 2017-10-07 ENCOUNTER — Other Ambulatory Visit: Payer: Self-pay

## 2017-10-07 ENCOUNTER — Emergency Department (HOSPITAL_COMMUNITY)
Admission: EM | Admit: 2017-10-07 | Discharge: 2017-10-08 | Disposition: A | Discharge status: Home / Self Care | Payer: Medicare Other | Attending: Emergency Medicine | Admitting: Emergency Medicine

## 2017-10-07 ENCOUNTER — Encounter (HOSPITAL_COMMUNITY): Payer: Self-pay | Admitting: *Deleted

## 2017-10-07 DIAGNOSIS — Y939 Activity, unspecified: Secondary | ICD-10-CM | POA: Insufficient documentation

## 2017-10-07 DIAGNOSIS — Y999 Unspecified external cause status: Secondary | ICD-10-CM | POA: Insufficient documentation

## 2017-10-07 DIAGNOSIS — Z79899 Other long term (current) drug therapy: Secondary | ICD-10-CM | POA: Insufficient documentation

## 2017-10-07 DIAGNOSIS — J189 Pneumonia, unspecified organism: Secondary | ICD-10-CM | POA: Diagnosis not present

## 2017-10-07 DIAGNOSIS — Z7982 Long term (current) use of aspirin: Secondary | ICD-10-CM | POA: Insufficient documentation

## 2017-10-07 DIAGNOSIS — I2510 Atherosclerotic heart disease of native coronary artery without angina pectoris: Secondary | ICD-10-CM | POA: Diagnosis not present

## 2017-10-07 DIAGNOSIS — J449 Chronic obstructive pulmonary disease, unspecified: Secondary | ICD-10-CM | POA: Insufficient documentation

## 2017-10-07 DIAGNOSIS — R4182 Altered mental status, unspecified: Secondary | ICD-10-CM | POA: Diagnosis not present

## 2017-10-07 DIAGNOSIS — Z951 Presence of aortocoronary bypass graft: Secondary | ICD-10-CM | POA: Insufficient documentation

## 2017-10-07 DIAGNOSIS — Y92129 Unspecified place in nursing home as the place of occurrence of the external cause: Secondary | ICD-10-CM | POA: Insufficient documentation

## 2017-10-07 DIAGNOSIS — J811 Chronic pulmonary edema: Secondary | ICD-10-CM

## 2017-10-07 DIAGNOSIS — I132 Hypertensive heart and chronic kidney disease with heart failure and with stage 5 chronic kidney disease, or end stage renal disease: Secondary | ICD-10-CM | POA: Diagnosis not present

## 2017-10-07 DIAGNOSIS — N186 End stage renal disease: Secondary | ICD-10-CM | POA: Diagnosis not present

## 2017-10-07 DIAGNOSIS — Z87891 Personal history of nicotine dependence: Secondary | ICD-10-CM | POA: Insufficient documentation

## 2017-10-07 DIAGNOSIS — E1122 Type 2 diabetes mellitus with diabetic chronic kidney disease: Secondary | ICD-10-CM | POA: Diagnosis not present

## 2017-10-07 DIAGNOSIS — Z992 Dependence on renal dialysis: Secondary | ICD-10-CM | POA: Insufficient documentation

## 2017-10-07 DIAGNOSIS — S20219A Contusion of unspecified front wall of thorax, initial encounter: Secondary | ICD-10-CM | POA: Diagnosis not present

## 2017-10-07 DIAGNOSIS — I5042 Chronic combined systolic (congestive) and diastolic (congestive) heart failure: Secondary | ICD-10-CM | POA: Insufficient documentation

## 2017-10-07 DIAGNOSIS — W1830XA Fall on same level, unspecified, initial encounter: Secondary | ICD-10-CM | POA: Insufficient documentation

## 2017-10-07 DIAGNOSIS — Z7901 Long term (current) use of anticoagulants: Secondary | ICD-10-CM | POA: Insufficient documentation

## 2017-10-07 DIAGNOSIS — Z8673 Personal history of transient ischemic attack (TIA), and cerebral infarction without residual deficits: Secondary | ICD-10-CM | POA: Insufficient documentation

## 2017-10-07 DIAGNOSIS — W19XXXA Unspecified fall, initial encounter: Secondary | ICD-10-CM

## 2017-10-07 DIAGNOSIS — I252 Old myocardial infarction: Secondary | ICD-10-CM | POA: Insufficient documentation

## 2017-10-07 LAB — BASIC METABOLIC PANEL
ANION GAP: 11 (ref 5–15)
BUN: 38 mg/dL — AB (ref 6–20)
CALCIUM: 8.3 mg/dL — AB (ref 8.9–10.3)
CO2: 28 mmol/L (ref 22–32)
CREATININE: 8.16 mg/dL — AB (ref 0.61–1.24)
Chloride: 97 mmol/L — ABNORMAL LOW (ref 101–111)
GFR calc Af Amer: 7 mL/min — ABNORMAL LOW (ref >60)
GFR, EST NON AFRICAN AMERICAN: 6 mL/min — AB (ref >60)
GLUCOSE: 96 mg/dL (ref 65–99)
Potassium: 4.5 mmol/L (ref 3.5–5.1)
Sodium: 136 mmol/L (ref 135–145)

## 2017-10-07 LAB — PROTIME-INR
INR: 2.45
Prothrombin Time: 26.3 seconds — ABNORMAL HIGH (ref 11.4–15.2)

## 2017-10-07 LAB — GLUCOSE, CAPILLARY
GLUCOSE-CAPILLARY: 93 mg/dL (ref 65–99)
Glucose-Capillary: 97 mg/dL (ref 65–99)

## 2017-10-07 MED ORDER — ALTEPLASE 2 MG IJ SOLR: 2.0000 mg | Freq: Once | INTRAMUSCULAR | Status: DC | PRN

## 2017-10-07 MED ORDER — SODIUM CHLORIDE 0.9 % IV SOLN: 250.0000 mg | INTRAVENOUS | Status: DC

## 2017-10-07 MED ORDER — NEPRO/CARBSTEADY PO LIQD: 237.0000 mL | Freq: Two times a day (BID) | ORAL | 0 refills | Status: DC

## 2017-10-07 MED ORDER — CINACALCET HCL 30 MG PO TABS: 120.0000 mg | ORAL_TABLET | Freq: Every day | ORAL | Status: DC

## 2017-10-07 MED ORDER — INSULIN DETEMIR 100 UNIT/ML ~~LOC~~ SOLN: 20.0000 [IU] | Freq: Two times a day (BID) | SUBCUTANEOUS | 11 refills | Status: DC

## 2017-10-07 MED ORDER — PENTAFLUOROPROP-TETRAFLUOROETH EX AERO: 1.0000 "application " | INHALATION_SPRAY | CUTANEOUS | Status: DC | PRN

## 2017-10-07 MED ORDER — WARFARIN SODIUM 1 MG PO TABS: 1.0000 mg | ORAL_TABLET | Freq: Every day | ORAL | Status: DC

## 2017-10-07 MED ORDER — DARBEPOETIN ALFA 100 MCG/0.5ML IJ SOSY: 100.0000 ug | PREFILLED_SYRINGE | INTRAMUSCULAR | Status: DC

## 2017-10-07 MED ORDER — DARBEPOETIN ALFA 100 MCG/0.5ML IJ SOSY
PREFILLED_SYRINGE | INTRAMUSCULAR | Status: AC
  Filled 2017-10-07: qty 0.5

## 2017-10-07 MED ORDER — SEVELAMER CARBONATE 800 MG PO TABS: 2400.0000 mg | ORAL_TABLET | Freq: Three times a day (TID) | ORAL | Status: DC

## 2017-10-07 MED ORDER — HEPARIN SODIUM (PORCINE) 1000 UNIT/ML DIALYSIS: 1000.0000 [IU] | INTRAMUSCULAR | Status: DC | PRN

## 2017-10-07 MED ORDER — DARBEPOETIN ALFA 100 MCG/0.5ML IJ SOSY: 100.0000 ug | PREFILLED_SYRINGE | INTRAMUSCULAR | 5 refills | Status: DC

## 2017-10-07 MED ORDER — SODIUM CHLORIDE 0.9 % IV SOLN: 100.0000 mL | INTRAVENOUS | Status: DC | PRN

## 2017-10-07 MED ORDER — METOPROLOL TARTRATE 25 MG PO TABS: 25.0000 mg | ORAL_TABLET | Freq: Two times a day (BID) | ORAL | Status: DC

## 2017-10-07 MED ORDER — DOXERCALCIFEROL 4 MCG/2ML IV SOLN: 2.0000 ug | INTRAVENOUS | Status: DC

## 2017-10-07 MED ORDER — ATORVASTATIN CALCIUM 80 MG PO TABS: 40.0000 mg | ORAL_TABLET | Freq: Every day | ORAL | Status: DC

## 2017-10-07 MED ORDER — LIDOCAINE-PRILOCAINE 2.5-2.5 % EX CREA: 1.0000 "application " | TOPICAL_CREAM | CUTANEOUS | Status: DC | PRN

## 2017-10-07 MED ORDER — AMIODARONE HCL 200 MG PO TABS: 200.0000 mg | ORAL_TABLET | Freq: Two times a day (BID) | ORAL | Status: DC

## 2017-10-07 MED ORDER — LIDOCAINE HCL (PF) 1 % IJ SOLN: 5.0000 mL | INTRAMUSCULAR | Status: DC | PRN

## 2017-10-07 NOTE — Progress Notes (Addendum)
[address]       [name] [address]             [phone]      3 Days Post-Op Procedure(s) (LRB): A/V FISTULAGRAM (N/A) PERIPHERAL VASCULAR BALLOON ANGIOPLASTY (Left) Subjective: C/O cough, non-productive  Objective: Vital signs in last 24 hours: Temp:  [97.7 F (36.5 C)-98.5 F (36.9 C)] 97.9 F (36.6 C) (12/24 0500) Pulse Rate:  [75-121] 78 (12/24 0500) Cardiac Rhythm: Atrial flutter (12/23 1900) Resp:  [12-23] 13 (12/23 1940) BP: (120-149)/(53-72) 138/65 (12/24 0500) SpO2:  [76 %-99 %] 97 % (12/24 0500)  Hemodynamic parameters for last 24 hours:    Intake/Output from previous day: 12/23 0701 - 12/24 0700 In: 462 [P.O.:462] Out: -  Intake/Output this shift: No intake/output data recorded.  General appearance: alert, cooperative and no distress Heart: regular rate and rhythm Lungs: clear to auscultation bilaterally Abdomen: benign Extremities: no edema Wound: incis healing well  Lab Results: Recent Labs    10/05/17 0615  WBC 10.0  HGB 8.6*  HCT 25.2*  PLT 401*   BMET:  Recent Labs    10/05/17 0613 10/05/17 0615  NA 134*  --   K 4.2  --   CL 95*  --   CO2 27  --   GLUCOSE 83  --   BUN 57*  --   CREATININE 8.27*  --   CALCIUM 8.4* 8.4*    PT/INR:  Recent Labs    10/07/17 0223  LABPROT 26.3*  INR 2.45   ABG    Component Value Date/Time   PHART 7.427 09/15/2017 0320   HCO3 24.3 09/15/2017 0320   TCO2 26 09/14/2017 1645   ACIDBASEDEF 4.0 (H) 09/14/2017 0153   O2SAT 75.0 09/20/2017 0805   CBG (last 3)  Recent Labs    10/06/17 2010 10/06/17 2131 10/07/17 0604  GLUCAP 83 93 93    Meds Scheduled Meds: . amiodarone  200 mg Per NG tube BID  . amitriptyline  25 mg Oral QHS  . aspirin  81 mg Oral Daily  . atorvastatin  80 mg Oral q1800  . bisacodyl  10 mg Oral Daily   Or  . bisacodyl  10 mg Rectal Daily  . chlorhexidine gluconate (MEDLINE KIT)  15 mL Mouth Rinse BID  . cinacalcet  120 mg Oral Q supper    . clopidogrel  75 mg Oral Daily  . darbepoetin (ARANESP) injection - DIALYSIS  100 mcg Intravenous Q Mon-HD  . doxercalciferol  2 mcg Intravenous Q M,W,F-HD  . feeding supplement (NEPRO CARB STEADY)  237 mL Oral BID BM  . Influenza vac split quadrivalent PF  0.5 mL Intramuscular Tomorrow-1000  . insulin aspart  0-5 Units Subcutaneous QHS  . insulin aspart  0-9 Units Subcutaneous TID WC  . insulin detemir  20 Units Subcutaneous BID  . levETIRAcetam  1,000 mg Per Tube Daily  . levETIRAcetam  500 mg Per Tube Q M,W,F-HD  . metoprolol tartrate  25 mg Oral BID  . multivitamin  1 tablet Oral QHS  . pantoprazole sodium  40 mg Per Tube Daily  . sevelamer carbonate  2,400 mg Oral TID WC  . sodium chloride flush  3 mL Intravenous Q12H   Continuous Infusions: . sodium chloride    . ferric gluconate (FERRLECIT/NULECIT) IV    . lactated ringers     PRN Meds:.sodium chloride, ondansetron (ZOFRAN) IV, RESOURCE THICKENUP CLEAR, sodium chloride flush  Xrays No results found.  Assessment/Plan: S/P Procedure(s) (LRB): A/V FISTULAGRAM (N/A) PERIPHERAL VASCULAR BALLOON ANGIOPLASTY (Left)  1 hemodyn stable, rate controlled aflutter 2 INR 2.45- can hold coumadin today then 1 mg and monitor at facility 3 mild cough- appears nonproductive- routine pulm toilet 4 for dialysis today 5 d/c to SNF after dialysis   LOS: 29 days    Ernest [name] 10/07/2017 Patient seen and examined, agree with above Says he feels better today. Speech is a less slurred this AM Dialysis, then to SNF afterwards  Ernest Lipps C. Roxan Hockey, MD [facility] [phone]

## 2017-10-07 NOTE — Care Management Important Message (Signed)
Important Message  Patient Details  Name: Ernest [name] MRN: [id number] Date of Birth: 16-Dec-1945   Medicare Important Message Given:  Yes    Nathen May 10/07/2017, 9:37 AM

## 2017-10-07 NOTE — ED Triage Notes (Signed)
Pt arrived by EMS c/o fall. Discharged from [facility] to [facility] today after CABG a month ago. Per facility, pt has been confused since arriving to facility prior today. Tonight, staff reports pt was found lying on the floor after being cleaned up. Per EMS, pt has hx of multiple CVAs and is confused at baseline. Facility unable to report baseline for patient since he just arrived there today. Pt also a MWF dialysis patient and A&Ox3, unable to recall the year. Denies pain, incision site appears to be healing appropriately, no visible drainage noted to site

## 2017-10-07 NOTE — Care Management Note (Signed)
Case Management Note Marvetta Gibbons RN, BSN Unit 4E-Case Manager-- [place] coverage [phone]  Patient Details  Name: Ernest [name] MRN: [id number] Date of Birth: [birth year]  Subjective/Objective:    Pt admitted with NSTEMI- s/p cath with CAD found- needs CABG- pt and family thinking on surgery - per CVTS can be done on 11/30 if they decide to move forward after plavix wash out               Action/Plan: PTA pt lived at home wife- uses cane- ambulatory- hx ESRD on HD- CM to follow for d/c needs pending surgery   Expected Discharge Date:                 Expected Discharge Plan:  Skilled Nursing Facility  In-House Referral:  Clinical Social Work  Discharge planning Services  CM Consult  Post Acute Care Choice:    Choice offered to:     DME Arranged:    DME Agency:     HH Arranged:    [place] Agency:     Status of Service:  Completed, signed off  If discussed at [employer] of Stay Meetings, dates discussed:  12/13  Discharge Disposition: skilled facility   Additional Comments:  10/07/17- 1010- Tascha Casares RN, CM- pt for d/c today to SNF- plan to go to [place] after HD today- CSW following for placement needs- per chart pt has seen Carly Rivet with [facility]- for PCP needs within last year. [facility] to follow pt through SNF- pt to be [place] with [facility] as pass through following SNF stay.   09/26/17- 1400- Bliss Tsang RN, CM- CM continues to follow for transition of care- pt will most likely need SNF- will need to consult CSW-  Per QC/LLOS mts- medical director has placed pt with [facility] program under [facility]- for pass through after SNF- will follow once out of ICU for where pt is going at time of discharge.   Dawayne Patricia, RN 10/07/2017, 10:12 AM

## 2017-10-07 NOTE — Progress Notes (Signed)
Clinical Social Worker facilitated patient discharge including contacting patient family and facility to confirm patient discharge plans.  Clinical information faxed to facility and family agreeable with plan.  CSW arranged ambulance transport via [facility] to [place] .  RN to call [phone] (pt will go into room 124)  report prior to discharge.  Clinical Social Worker will sign off for now as social work intervention is no longer needed. Please consult [place] again if new need arises.  Rhea Pink, MSW, [place]

## 2017-10-07 NOTE — Progress Notes (Signed)
[facility] Progress Note  Subjective: no c/o  Vitals:   10/06/17 1940 10/06/17 2357 10/07/17 0500 10/07/17 1100  BP: 120/64 (!) 149/72 138/65 (P) 140/67  Pulse: 78 78 78   Resp: 13   (P) 13  Temp: 97.7 F (36.5 C) 98.1 F (36.7 C) 97.9 F (36.6 C)   TempSrc: Oral Axillary Axillary   SpO2: 98% 97% 97%   Weight:      Height:        Inpatient medications: . amiodarone  200 mg Per NG tube BID  . amitriptyline  25 mg Oral QHS  . aspirin  81 mg Oral Daily  . atorvastatin  80 mg Oral q1800  . bisacodyl  10 mg Oral Daily   Or  . bisacodyl  10 mg Rectal Daily  . chlorhexidine gluconate (MEDLINE KIT)  15 mL Mouth Rinse BID  . cinacalcet  120 mg Oral Q supper  . clopidogrel  75 mg Oral Daily  . darbepoetin (ARANESP) injection - DIALYSIS  100 mcg Intravenous Q Mon-HD  . doxercalciferol  2 mcg Intravenous Q M,W,F-HD  . feeding supplement (NEPRO CARB STEADY)  237 mL Oral BID BM  . Influenza vac split quadrivalent PF  0.5 mL Intramuscular Tomorrow-1000  . insulin aspart  0-5 Units Subcutaneous QHS  . insulin aspart  0-9 Units Subcutaneous TID WC  . insulin detemir  20 Units Subcutaneous BID  . levETIRAcetam  1,000 mg Per Tube Daily  . levETIRAcetam  500 mg Per Tube Q M,W,F-HD  . metoprolol tartrate  25 mg Oral BID  . multivitamin  1 tablet Oral QHS  . pantoprazole sodium  40 mg Per Tube Daily  . sevelamer carbonate  2,400 mg Oral TID WC  . sodium chloride flush  3 mL Intravenous Q12H   . sodium chloride    . sodium chloride    . sodium chloride    . ferric gluconate (FERRLECIT/NULECIT) IV    . lactated ringers     sodium chloride, sodium chloride, sodium chloride, alteplase, heparin, lidocaine (PF), lidocaine-prilocaine, ondansetron (ZOFRAN) IV, pentafluoroprop-tetrafluoroeth, RESOURCE THICKENUP CLEAR, sodium chloride flush  Exam: Awakens easily, responsive No jvd Chest clear  RRR Abd soft ntnd obese Ext no edema LUE 2+ edema, +bruit AVG   Dialysis:  [facility] MWF 4h 33mn   2/2 bath   95.5kg    LUE AVG   Hep none -calc 0.75 ug -venofer 50/wk      Impression: 1. ESRD MWF [facility] via AVF 2. 4V CABG 09/13/17 3. Post surgery AMS; waxing/waning 4. Anemia of CKD; 12/22 TSAT 10%, Ferritin 1170 5. DM2 6. Hx/o CVA 7. Debility, for SNF at discharge 8. LUE AVG for eval tomorrow with [facility] 9. CKD BMD - sensipar 1279md; hectorol 6m6mqTx. Sevelmer 2400 qAC 10. [facility]  P - plan HD today , for dc after HD today.  Next OP HD will be this Wed.    [facility] pager [phone]/24/2018, 11:15 AM   Recent Labs  Lab 10/01/17 0424 10/02/17 0718 10/05/17 0613 10/05/17 0615  NA 134* 133* 134*  --   K 4.3 4.3 4.2  --   CL 94* 93* 95*  --   CO2 '28 25 27  ' --   GLUCOSE 181* 122* 83  --   BUN 59* 77* 57*  --   CREATININE 5.82* 7.65* 8.27*  --   CALCIUM 8.7* 8.5* 8.4* 8.4*  PHOS  --  5.8* 5.0*  --    Recent Labs  Lab 10/02/17 0718 10/05/17 0613  ALBUMIN 2.2* 2.1*   Recent Labs  Lab 10/01/17 0424 10/02/17 0730 10/05/17 0615  WBC 11.8* 10.6* 10.0  HGB 8.6* 8.3* 8.6*  HCT 25.4* 24.8* 25.2*  MCV 81.7 82.7 80.8  PLT 412* 413* 401*   Iron/TIBC/Ferritin/ %Sat    Component Value Date/Time   IRON 20 (L) 10/05/2017 0615   TIBC 193 (L) 10/05/2017 0615   FERRITIN 1,170 (H) 10/05/2017 0615   IRONPCTSAT 10 (L) 10/05/2017 1121

## 2017-10-07 NOTE — Progress Notes (Signed)
[place]charged to [place] report given

## 2017-10-08 ENCOUNTER — Emergency Department (HOSPITAL_COMMUNITY): Payer: Medicare Other

## 2017-10-08 DIAGNOSIS — K5901 Slow transit constipation: Secondary | ICD-10-CM | POA: Diagnosis not present

## 2017-10-08 DIAGNOSIS — R4182 Altered mental status, unspecified: Secondary | ICD-10-CM | POA: Diagnosis not present

## 2017-10-08 DIAGNOSIS — T83198A Other mechanical complication of other urinary devices and implants, initial encounter: Secondary | ICD-10-CM | POA: Diagnosis not present

## 2017-10-08 DIAGNOSIS — G8911 Acute pain due to trauma: Secondary | ICD-10-CM | POA: Diagnosis not present

## 2017-10-08 DIAGNOSIS — Z7901 Long term (current) use of anticoagulants: Secondary | ICD-10-CM | POA: Diagnosis not present

## 2017-10-08 DIAGNOSIS — Z79899 Other long term (current) drug therapy: Secondary | ICD-10-CM | POA: Diagnosis not present

## 2017-10-08 DIAGNOSIS — J811 Chronic pulmonary edema: Secondary | ICD-10-CM | POA: Diagnosis not present

## 2017-10-08 DIAGNOSIS — Z48812 Encounter for surgical aftercare following surgery on the circulatory system: Secondary | ICD-10-CM | POA: Diagnosis not present

## 2017-10-08 DIAGNOSIS — Z9889 Other specified postprocedural states: Secondary | ICD-10-CM | POA: Diagnosis not present

## 2017-10-08 DIAGNOSIS — I639 Cerebral infarction, unspecified: Secondary | ICD-10-CM | POA: Diagnosis not present

## 2017-10-08 DIAGNOSIS — I214 Non-ST elevation (NSTEMI) myocardial infarction: Secondary | ICD-10-CM | POA: Diagnosis not present

## 2017-10-08 DIAGNOSIS — Z951 Presence of aortocoronary bypass graft: Secondary | ICD-10-CM | POA: Diagnosis not present

## 2017-10-08 DIAGNOSIS — T82838A Hemorrhage of vascular prosthetic devices, implants and grafts, initial encounter: Secondary | ICD-10-CM | POA: Diagnosis not present

## 2017-10-08 DIAGNOSIS — E785 Hyperlipidemia, unspecified: Secondary | ICD-10-CM | POA: Diagnosis not present

## 2017-10-08 DIAGNOSIS — F0151 Vascular dementia with behavioral disturbance: Secondary | ICD-10-CM | POA: Diagnosis not present

## 2017-10-08 DIAGNOSIS — N179 Acute kidney failure, unspecified: Secondary | ICD-10-CM | POA: Diagnosis not present

## 2017-10-08 DIAGNOSIS — R488 Other symbolic dysfunctions: Secondary | ICD-10-CM | POA: Diagnosis not present

## 2017-10-08 DIAGNOSIS — K5909 Other constipation: Secondary | ICD-10-CM | POA: Diagnosis not present

## 2017-10-08 DIAGNOSIS — J449 Chronic obstructive pulmonary disease, unspecified: Secondary | ICD-10-CM | POA: Diagnosis not present

## 2017-10-08 DIAGNOSIS — E119 Type 2 diabetes mellitus without complications: Secondary | ICD-10-CM | POA: Diagnosis not present

## 2017-10-08 DIAGNOSIS — B351 Tinea unguium: Secondary | ICD-10-CM | POA: Diagnosis not present

## 2017-10-08 DIAGNOSIS — B370 Candidal stomatitis: Secondary | ICD-10-CM | POA: Diagnosis not present

## 2017-10-08 DIAGNOSIS — I69398 Other sequelae of cerebral infarction: Secondary | ICD-10-CM | POA: Diagnosis not present

## 2017-10-08 DIAGNOSIS — M79676 Pain in unspecified toe(s): Secondary | ICD-10-CM | POA: Diagnosis not present

## 2017-10-08 DIAGNOSIS — E782 Mixed hyperlipidemia: Secondary | ICD-10-CM | POA: Diagnosis not present

## 2017-10-08 DIAGNOSIS — R2689 Other abnormalities of gait and mobility: Secondary | ICD-10-CM | POA: Diagnosis not present

## 2017-10-08 DIAGNOSIS — R278 Other lack of coordination: Secondary | ICD-10-CM | POA: Diagnosis not present

## 2017-10-08 DIAGNOSIS — T82858A Stenosis of vascular prosthetic devices, implants and grafts, initial encounter: Secondary | ICD-10-CM | POA: Diagnosis not present

## 2017-10-08 DIAGNOSIS — F0150 Vascular dementia without behavioral disturbance: Secondary | ICD-10-CM | POA: Diagnosis not present

## 2017-10-08 DIAGNOSIS — Z7982 Long term (current) use of aspirin: Secondary | ICD-10-CM | POA: Diagnosis not present

## 2017-10-08 DIAGNOSIS — E1122 Type 2 diabetes mellitus with diabetic chronic kidney disease: Secondary | ICD-10-CM | POA: Diagnosis not present

## 2017-10-08 DIAGNOSIS — I69328 Other speech and language deficits following cerebral infarction: Secondary | ICD-10-CM | POA: Diagnosis not present

## 2017-10-08 DIAGNOSIS — I483 Typical atrial flutter: Secondary | ICD-10-CM | POA: Diagnosis not present

## 2017-10-08 DIAGNOSIS — E1140 Type 2 diabetes mellitus with diabetic neuropathy, unspecified: Secondary | ICD-10-CM | POA: Diagnosis not present

## 2017-10-08 DIAGNOSIS — E118 Type 2 diabetes mellitus with unspecified complications: Secondary | ICD-10-CM | POA: Diagnosis not present

## 2017-10-08 DIAGNOSIS — D631 Anemia in chronic kidney disease: Secondary | ICD-10-CM | POA: Diagnosis not present

## 2017-10-08 DIAGNOSIS — I10 Essential (primary) hypertension: Secondary | ICD-10-CM | POA: Diagnosis not present

## 2017-10-08 DIAGNOSIS — E11319 Type 2 diabetes mellitus with unspecified diabetic retinopathy without macular edema: Secondary | ICD-10-CM | POA: Diagnosis not present

## 2017-10-08 DIAGNOSIS — Z87891 Personal history of nicotine dependence: Secondary | ICD-10-CM | POA: Diagnosis not present

## 2017-10-08 DIAGNOSIS — J90 Pleural effusion, not elsewhere classified: Secondary | ICD-10-CM | POA: Diagnosis not present

## 2017-10-08 DIAGNOSIS — T82898A Other specified complication of vascular prosthetic devices, implants and grafts, initial encounter: Secondary | ICD-10-CM | POA: Diagnosis not present

## 2017-10-08 DIAGNOSIS — I6930 Unspecified sequelae of cerebral infarction: Secondary | ICD-10-CM | POA: Diagnosis not present

## 2017-10-08 DIAGNOSIS — E1165 Type 2 diabetes mellitus with hyperglycemia: Secondary | ICD-10-CM | POA: Diagnosis not present

## 2017-10-08 DIAGNOSIS — Z794 Long term (current) use of insulin: Secondary | ICD-10-CM | POA: Diagnosis not present

## 2017-10-08 DIAGNOSIS — T83498A Other mechanical complication of other prosthetic devices, implants and grafts of genital tract, initial encounter: Secondary | ICD-10-CM | POA: Diagnosis not present

## 2017-10-08 DIAGNOSIS — N2581 Secondary hyperparathyroidism of renal origin: Secondary | ICD-10-CM | POA: Diagnosis not present

## 2017-10-08 DIAGNOSIS — M6281 Muscle weakness (generalized): Secondary | ICD-10-CM | POA: Diagnosis not present

## 2017-10-08 DIAGNOSIS — N186 End stage renal disease: Secondary | ICD-10-CM | POA: Diagnosis not present

## 2017-10-08 DIAGNOSIS — D509 Iron deficiency anemia, unspecified: Secondary | ICD-10-CM | POA: Diagnosis not present

## 2017-10-08 DIAGNOSIS — Z992 Dependence on renal dialysis: Secondary | ICD-10-CM | POA: Diagnosis not present

## 2017-10-08 DIAGNOSIS — I871 Compression of vein: Secondary | ICD-10-CM | POA: Diagnosis not present

## 2017-10-08 DIAGNOSIS — R531 Weakness: Secondary | ICD-10-CM | POA: Diagnosis not present

## 2017-10-08 DIAGNOSIS — J189 Pneumonia, unspecified organism: Secondary | ICD-10-CM | POA: Diagnosis not present

## 2017-10-08 DIAGNOSIS — G40209 Localization-related (focal) (partial) symptomatic epilepsy and epileptic syndromes with complex partial seizures, not intractable, without status epilepticus: Secondary | ICD-10-CM | POA: Diagnosis not present

## 2017-10-08 DIAGNOSIS — R4189 Other symptoms and signs involving cognitive functions and awareness: Secondary | ICD-10-CM | POA: Diagnosis not present

## 2017-10-08 DIAGNOSIS — Y69 Unspecified misadventure during surgical and medical care: Secondary | ICD-10-CM | POA: Diagnosis not present

## 2017-10-08 DIAGNOSIS — I4892 Unspecified atrial flutter: Secondary | ICD-10-CM | POA: Diagnosis not present

## 2017-10-08 DIAGNOSIS — E1151 Type 2 diabetes mellitus with diabetic peripheral angiopathy without gangrene: Secondary | ICD-10-CM | POA: Diagnosis not present

## 2017-10-08 DIAGNOSIS — I472 Ventricular tachycardia: Secondary | ICD-10-CM | POA: Diagnosis not present

## 2017-10-08 DIAGNOSIS — R1084 Generalized abdominal pain: Secondary | ICD-10-CM | POA: Diagnosis not present

## 2017-10-08 DIAGNOSIS — R1312 Dysphagia, oropharyngeal phase: Secondary | ICD-10-CM | POA: Diagnosis not present

## 2017-10-08 DIAGNOSIS — R569 Unspecified convulsions: Secondary | ICD-10-CM | POA: Diagnosis not present

## 2017-10-08 DIAGNOSIS — S20219A Contusion of unspecified front wall of thorax, initial encounter: Secondary | ICD-10-CM | POA: Diagnosis not present

## 2017-10-08 DIAGNOSIS — I132 Hypertensive heart and chronic kidney disease with heart failure and with stage 5 chronic kidney disease, or end stage renal disease: Secondary | ICD-10-CM | POA: Diagnosis not present

## 2017-10-08 DIAGNOSIS — I2510 Atherosclerotic heart disease of native coronary artery without angina pectoris: Secondary | ICD-10-CM | POA: Diagnosis not present

## 2017-10-08 DIAGNOSIS — G9341 Metabolic encephalopathy: Secondary | ICD-10-CM | POA: Diagnosis not present

## 2017-10-08 DIAGNOSIS — F339 Major depressive disorder, recurrent, unspecified: Secondary | ICD-10-CM | POA: Diagnosis not present

## 2017-10-08 DIAGNOSIS — I5042 Chronic combined systolic (congestive) and diastolic (congestive) heart failure: Secondary | ICD-10-CM | POA: Diagnosis not present

## 2017-10-08 DIAGNOSIS — E1129 Type 2 diabetes mellitus with other diabetic kidney complication: Secondary | ICD-10-CM | POA: Diagnosis not present

## 2017-10-08 DIAGNOSIS — G40909 Epilepsy, unspecified, not intractable, without status epilepticus: Secondary | ICD-10-CM | POA: Diagnosis not present

## 2017-10-08 LAB — COMPREHENSIVE METABOLIC PANEL
ALK PHOS: 71 U/L (ref 38–126)
ALT: 17 U/L (ref 17–63)
AST: 16 U/L (ref 15–41)
Albumin: 2.5 g/dL — ABNORMAL LOW (ref 3.5–5.0)
Anion gap: 13 (ref 5–15)
BUN: 23 mg/dL — AB (ref 6–20)
CALCIUM: 8.3 mg/dL — AB (ref 8.9–10.3)
CO2: 26 mmol/L (ref 22–32)
CREATININE: 5.85 mg/dL — AB (ref 0.61–1.24)
Chloride: 95 mmol/L — ABNORMAL LOW (ref 101–111)
GFR, EST AFRICAN AMERICAN: 10 mL/min — AB (ref >60)
GFR, EST NON AFRICAN AMERICAN: 9 mL/min — AB (ref >60)
Glucose, Bld: 193 mg/dL — ABNORMAL HIGH (ref 65–99)
Potassium: 4.3 mmol/L (ref 3.5–5.1)
Sodium: 134 mmol/L — ABNORMAL LOW (ref 135–145)
Total Bilirubin: 0.6 mg/dL (ref 0.3–1.2)
Total Protein: 5.7 g/dL — ABNORMAL LOW (ref 6.5–8.1)

## 2017-10-08 LAB — CBC WITH DIFFERENTIAL/PLATELET
BASOS PCT: 1 %
Basophils Absolute: 0.1 10*3/uL (ref 0.0–0.1)
EOS ABS: 0.4 10*3/uL (ref 0.0–0.7)
EOS PCT: 4 %
HCT: 29.7 % — ABNORMAL LOW (ref 39.0–52.0)
HEMOGLOBIN: 10 g/dL — AB (ref 13.0–17.0)
LYMPHS ABS: 1.5 10*3/uL (ref 0.7–4.0)
Lymphocytes Relative: 16 %
MCH: 27.8 pg (ref 26.0–34.0)
MCHC: 33.7 g/dL (ref 30.0–36.0)
MCV: 82.5 fL (ref 78.0–100.0)
MONOS PCT: 8 %
Monocytes Absolute: 0.7 10*3/uL (ref 0.1–1.0)
NEUTROS PCT: 71 %
Neutro Abs: 6.5 10*3/uL (ref 1.7–7.7)
PLATELETS: 477 10*3/uL — AB (ref 150–400)
RBC: 3.6 MIL/uL — ABNORMAL LOW (ref 4.22–5.81)
RDW: 15.8 % — ABNORMAL HIGH (ref 11.5–15.5)
WBC: 9.2 10*3/uL (ref 4.0–10.5)

## 2017-10-08 LAB — AMMONIA: AMMONIA: 20 umol/L (ref 9–35)

## 2017-10-08 LAB — CBG MONITORING, ED: Glucose-Capillary: 154 mg/dL — ABNORMAL HIGH (ref 65–99)

## 2017-10-08 LAB — APTT: aPTT: 57 seconds — ABNORMAL HIGH (ref 24–36)

## 2017-10-08 LAB — PROTIME-INR
INR: 2.69
PROTHROMBIN TIME: 28.4 s — AB (ref 11.4–15.2)
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
## 2017-10-08 NOTE — ED Notes (Signed)
Report called to nurse at [facility], advised patient will be returning.

## 2017-10-08 NOTE — Discharge Instructions (Signed)
Ernest [name] was seen in the ER after the fall. Workup in the ER does not show any fractures or injuries to the brain. Lab work is also reassuring.  Please take great care in preventing falls in the future. Send Mr. Aumiller to the emergency room if he starts having fevers, new cough, shortness of breath, hypoxia.

## 2017-10-08 NOTE — ED Notes (Signed)
Pt taken to xray and CT 

## 2017-10-08 NOTE — ED Notes (Signed)
Notified [facility] for transportation back to home.

## 2017-10-09 ENCOUNTER — Encounter: Payer: Self-pay | Admitting: Adult Health

## 2017-10-09 ENCOUNTER — Non-Acute Institutional Stay (SKILLED_NURSING_FACILITY): Payer: Medicare Other | Admitting: Adult Health

## 2017-10-09 DIAGNOSIS — I4729 Other ventricular tachycardia: Secondary | ICD-10-CM

## 2017-10-09 DIAGNOSIS — N186 End stage renal disease: Secondary | ICD-10-CM | POA: Diagnosis not present

## 2017-10-09 DIAGNOSIS — D631 Anemia in chronic kidney disease: Secondary | ICD-10-CM | POA: Diagnosis not present

## 2017-10-09 DIAGNOSIS — I472 Ventricular tachycardia: Secondary | ICD-10-CM

## 2017-10-09 DIAGNOSIS — F339 Major depressive disorder, recurrent, unspecified: Secondary | ICD-10-CM | POA: Diagnosis not present

## 2017-10-09 DIAGNOSIS — Z7901 Long term (current) use of anticoagulants: Secondary | ICD-10-CM | POA: Diagnosis not present

## 2017-10-09 DIAGNOSIS — R531 Weakness: Secondary | ICD-10-CM

## 2017-10-09 DIAGNOSIS — D509 Iron deficiency anemia, unspecified: Secondary | ICD-10-CM | POA: Diagnosis not present

## 2017-10-09 DIAGNOSIS — R569 Unspecified convulsions: Secondary | ICD-10-CM

## 2017-10-09 DIAGNOSIS — I10 Essential (primary) hypertension: Secondary | ICD-10-CM

## 2017-10-09 DIAGNOSIS — I2510 Atherosclerotic heart disease of native coronary artery without angina pectoris: Secondary | ICD-10-CM | POA: Diagnosis not present

## 2017-10-09 DIAGNOSIS — I6930 Unspecified sequelae of cerebral infarction: Secondary | ICD-10-CM | POA: Diagnosis not present

## 2017-10-09 DIAGNOSIS — R5381 Other malaise: Secondary | ICD-10-CM | POA: Insufficient documentation

## 2017-10-09 DIAGNOSIS — E1122 Type 2 diabetes mellitus with diabetic chronic kidney disease: Secondary | ICD-10-CM

## 2017-10-09 DIAGNOSIS — Z794 Long term (current) use of insulin: Secondary | ICD-10-CM

## 2017-10-09 DIAGNOSIS — R4182 Altered mental status, unspecified: Secondary | ICD-10-CM | POA: Insufficient documentation

## 2017-10-09 DIAGNOSIS — I69398 Other sequelae of cerebral infarction: Secondary | ICD-10-CM

## 2017-10-09 DIAGNOSIS — Z992 Dependence on renal dialysis: Secondary | ICD-10-CM

## 2017-10-09 DIAGNOSIS — N2581 Secondary hyperparathyroidism of renal origin: Secondary | ICD-10-CM | POA: Diagnosis not present

## 2017-10-09 LAB — POCT INR: INR: 2.1 — AB (ref 0.9–1.1)

## 2017-10-09 LAB — PROTIME-INR: Protime: 22.9 — AB (ref 10.0–13.8)

## 2017-10-09 NOTE — Progress Notes (Addendum)
Location:  [place] Room Number: 124-A Place of Service:  SNF (31) Provider:  Durenda Age, NP  Patient Care Team: Patient, [name] as PCP - General (General Practice) Corliss Parish, MD as PCP - Internal Medicine (Nephrology) Zadie Rhine Clent Demark, MD as Consulting Physician (Ophthalmology) Vern Claude, Strong as [facility]  Extended Emergency Contact Information Primary Emergency Contact: Marylyn Ishihara Address: [address]          [place], [place] [address] [place]te Litter of [place] Phone: [phone] Mobile Phone: [phone] Relation: Spouse Secondary Emergency Contact: Alean Rinne Address: [address] [place] [address] [place] of [place] Phone: [phone] Mobile Phone: [phone] Relation: Daughter  Code Status: Full Code  Goals of care: Advanced Directive information Advanced Directives 10/07/2017  Does Patient Have a Medical Advance Directive? No  Type of Advance Directive -  Does patient want to make changes to medical advance directive? -  Copy of [place] in Chart? -  Would patient like information on creating a medical advance directive? -  Pre-existing out of facility DNR order (yellow form or pink MOST form) -     Chief Complaint  Patient presents with  . Acute Visit    Hospital followup of admission at [facility] 11/24-12/24/18 for NSTEMI    HPI:  Pt is a 71 y.o. male seen today for acute visit for followup of his hospitalization at [facility] 09/07/17-10/07/17 for acute on chronic systolic and diastolic heart failure. He was having worsening SOB. He underwent cardiac catheterization on 09/09/17 and was found to have significant CAD with LM involvement. He underwent CABG 4 utilizing LIMA to LAD, sequential SVG to OM and ramus intermediate and SVG for acute marginal, on 09/13/17. He underwent endoscopic vein harvest from his right leg.  He also required mitral valve repair. He was extubated on post op day 1 and dialysis was started on post op day 2. Repeat head CT scan was obtained and was stable from his baseline scans. Vascular surgery performed a fistulogram on 10/04/17 and revealed a significant stenosis of the subclavian/innominate junction and angioplasty was performed. He was sent to the ED on 10/08/17 due to confusion and a fall. Imaging and labs did not reveal any significant findings. He was admitted to [place] on 10/07/17 for short-term rehabilitation.  He has a PMH of cerebral embolism with cerebral infarction, NSVT, ESRD on HD, TIA, OSA, HLD with type 2 DM, and CAD. He was seen in the room today after dialysis. His speech was slurred and was motioning to go back to his bed.    Past Medical History:  Diagnosis Date  . Abnormal stress test    s/p cath November 2013 with modest disease involving the ostial left main, proximal LAD, proximal RCA - do not appear to be hemodynamically signficant; mild LV dysfunction  . Bacteremia   . Chronic systolic CHF (congestive heart failure) ([place])   . COPD (chronic obstructive pulmonary disease) ([place])   . Coronary artery disease    per cath report 2013  . Ejection fraction < 50%   . Erectile dysfunction    penile implant  . ESRD (end stage renal disease) ([place])    Started HD in [place] in 2009, ESRD was due to DM. Moved to [facility] in Dec 2009 and now gets dialysis at [facility] on a MWF schedule.     [name]  Gout    Hx: of  . Hepatitis C   . Hyperlipidemia   . Hypertension   . Obesity   . Retinopathy   . Shortness of breath    Hx: of with exertion  . Stroke ([place])   . Type II or unspecified type diabetes mellitus without mention of complication, not stated as uncontrolled    adult onset   Past Surgical History:  Procedure Laterality Date  . A/V FISTULAGRAM N/A 10/04/2017   Procedure: A/V FISTULAGRAM;  Surgeon: Elam Dutch, MD;  Location: [place]  CV LAB;  Service: Cardiovascular;  Laterality: N/A;  . AV FISTULA PLACEMENT Left 01/13/2013   Procedure: INSERTION OF ARTERIOVENOUS (AV) GORE-TEX GRAFT ARM;  Surgeon: Angelia Mould, MD;  Location: [facility];  Service: Vascular;  Laterality: Left;  . AV FISTULA PLACEMENT Left 05/05/2013   Procedure: INSERTION OF LEFT UPPER ARM  ARTERIOVENOUS GORTEX GRAFT;  Surgeon: Angelia Mould, MD;  Location: [place];  Service: Vascular;  Laterality: Left;  . [place] REMOVAL Left 03/26/2013   Procedure: REMOVAL OF  NON INCORPORATED ARTERIOVENOUS GORETEX GRAFT ([place]) left arm * repair of  left brachial artery with vein patch angioplasty.;  Surgeon: Angelia Mould, MD;  Location: [facility];  Service: Vascular;  Laterality: Left;  . CARDIAC CATHETERIZATION  August 26, 2012  . CATARACT EXTRACTION W/ INTRAOCULAR LENS  IMPLANT, BILATERAL    . COLONOSCOPY    . CORONARY ARTERY BYPASS GRAFT N/A 09/13/2017   Procedure: CORONARY ARTERY BYPASS GRAFTING (CABG) times four LIMA  to LAD, SVG sequentially to OM and RAMUS Intermediate and SVG to Acute Marginal;  Surgeon: Melrose Nakayama, MD;  Location: [facility];  Service: Open Heart Surgery;  Laterality: N/A;  . EMBOLECTOMY Right 08/27/2013   Procedure: EMBOLECTOMY;  Surgeon: Mal Misty, MD;  Location: [facility];  Service: Vascular;  Laterality: Right;  Thrombectomy of Radial and ulnar artery.  [name] EYE SURGERY     laser.  and surgery for DM  . fistula     RUE and wrist  . INSERTION OF DIALYSIS CATHETER Right 01/01/2013   Procedure: INSERTION OF DIALYSIS CATHETER right  internal jugular;  Surgeon: Serafina Mitchell, MD;  Location: [facility];  Service: Vascular;  Laterality: Right;  . INSERTION OF DIALYSIS CATHETER N/A 03/26/2013   Procedure: INSERTION OF DIALYSIS CATHETER Left internal jugular vein;  Surgeon: Angelia Mould, MD;  Location: [place];  Service: Vascular;  Laterality: N/A;  . LEFT HEART CATH AND CORONARY ANGIOGRAPHY N/A 09/09/2017   Procedure: LEFT HEART CATH  AND CORONARY ANGIOGRAPHY;  Surgeon: Troy Sine, MD;  Location: [facility] [place] CV LAB;  Service: Cardiovascular;  Laterality: N/A;  . LIGATION OF ARTERIOVENOUS  FISTULA Right 12/30/2013   Procedure: REMOVAL OF SEGMENT OF GORTEX GRAFT AND FISTULA  AND REPAIR OF BRACHIAL ARTERY;  Surgeon: Mal Misty, MD;  Location: [facility];  Service: Vascular;  Laterality: Right;  . MITRAL VALVE REPAIR N/A 09/13/2017   Procedure: MITRAL VALVE REPAIR (MVR);  Surgeon: Melrose Nakayama, MD;  Location: [place] [facility];  Service: Open Heart Surgery;  Laterality: N/A;  . PENILE PROSTHESIS IMPLANT     1997.. no card  . PERIPHERAL VASCULAR BALLOON ANGIOPLASTY Left 10/04/2017   Procedure: PERIPHERAL VASCULAR BALLOON ANGIOPLASTY;  Surgeon: Elam Dutch, MD;  Location: [place] CV LAB;  Service: Cardiovascular;  Laterality: Left;  AVF  . TEE WITHOUT CARDIOVERSION N/A 06/11/2013   Procedure: TRANSESOPHAGEAL ECHOCARDIOGRAM (TEE);  Surgeon: Larey Dresser,  MD;  Location: [facility];  Service: Cardiovascular;  Laterality: N/A;  . TEE WITHOUT CARDIOVERSION N/A 09/13/2017   Procedure: TRANSESOPHAGEAL ECHOCARDIOGRAM (TEE);  Surgeon: Melrose Nakayama, MD;  Location: [facility];  Service: Open Heart Surgery;  Laterality: N/A;  . THROMBECTOMY W/ EMBOLECTOMY Left 03/26/2013   Procedure: Attempted thrombectomy of left arm arteriovenous goretex graft.;  Surgeon: Angelia Mould, MD;  Location: [facility];  Service: Vascular;  Laterality: Left;  [name] VENOGRAM N/A 04/07/2013   Procedure: VENOGRAM;  Surgeon: Serafina Mitchell, MD;  Location: [facility] CATH LAB;  Service: Cardiovascular;  Laterality: N/A;    [name]  Outpatient Encounter Medications as of 10/09/2017  Medication Sig  . amiodarone (PACERONE) 200 MG tablet Take 1 tablet (200 mg total) by mouth 2 (two) times daily.  [name] amitriptyline (ELAVIL) 25 MG tablet TAKE ONE TABLET BY MOUTH AT BEDTIME FOR  NEUROPATHY  . aspirin EC 81 MG tablet Take 1 tablet (81 mg total) by mouth  daily.  [name] atorvastatin (LIPITOR) 40 MG tablet Take 40 mg by mouth daily.  . calcium acetate (PHOSLO) 667 MG capsule Take 2,001 mg by mouth 3 (three) times daily with meals.  . cinacalcet (SENSIPAR) 30 MG tablet Take 4 tablets (120 mg total) by mouth daily with supper.  Derrill Memo ON 10/14/2017] Darbepoetin Alfa (ARANESP) 100 MCG/0.5ML SOSY injection Inject 0.5 mLs (100 mcg total) into the vein every Monday with hemodialysis.  [name] doxercalciferol (HECTOROL) 4 MCG/2ML injection Inject 1 mL (2 mcg total) into the vein every Monday, Wednesday, and Friday with hemodialysis.  . ferric gluconate 250 mg in sodium chloride 0.9 % 100 mL Inject 250 mg into the vein every Monday, Wednesday, and Friday with hemodialysis.  [name] insulin detemir (LEVEMIR) 100 UNIT/ML injection Inject 0.2 mLs (20 Units total) into the skin 2 (two) times daily.  [name] levETIRAcetam (KEPPRA) 500 MG tablet Take 2 tablets (1,000 mg total) by mouth daily. Then take extra 500 mg (1 tablet) on MWF after HD  . metoprolol tartrate (LOPRESSOR) 25 MG tablet Take 1 tablet (25 mg total) by mouth 2 (two) times daily.  . Nutritional Supplements (FEEDING SUPPLEMENT, NEPRO CARB STEADY,) LIQD Take 237 mLs by mouth 2 (two) times daily between meals.  . warfarin (COUMADIN) 1 MG tablet Take 1 tablet (1 mg total) by mouth daily. As directed to keep INR between 2.0 and 2.5  . [DISCONTINUED] atorvastatin (LIPITOR) 80 MG tablet Take 0.5 tablets (40 mg total) by mouth daily.  . [DISCONTINUED] multivitamin (RENA-VIT) TABS tablet Take 1 tablet by mouth daily.  . [DISCONTINUED] sevelamer carbonate (RENVELA) 800 MG tablet Take 3 tablets (2,400 mg total) by mouth 3 (three) times daily with meals.   No facility-administered encounter medications on file as of 10/09/2017.     Review of Systems  Unable to obtain due to slurred speech and confusion    Immunization History  Administered Date(s) Administered  . Influenza,inj,Quad PF,6+ Mos 07/13/2015  .  Influenza-Unspecified 07/15/2013  . Pneumococcal-Unspecified 07/15/2013   Pertinent  Health Maintenance Due  Topic Date Due  . FOOT EXAM  09/18/1958  . URINE MICROALBUMIN  09/18/1958  . PNA vac Low Risk Adult (1 of 2 - PCV13) 07/15/2014  . INFLUENZA VACCINE  05/15/2017  . OPHTHALMOLOGY EXAM  08/07/2017  . LIPID PANEL  10/28/2017  . HEMOGLOBIN A1C  12/09/2017  . COLONOSCOPY  06/01/2018   Fall Risk  07/18/2017 11/12/2016 02/07/2016 09/15/2015 08/30/2015  Falls in the past year? Yes Yes Yes Yes Yes  Number falls in past yr: 2 or more 2 or more 2 or more 2 or more 1  Injury with Fall? - No Yes No No  Risk Factor Category  - - High Fall Risk High Fall Risk -  Risk for fall due to : - - - Impaired balance/gait Other (Comment)  Risk for fall due to: Comment - - - - Tripped over pair of shoes.  Follow up Falls evaluation completed - Falls evaluation completed - Falls prevention discussed      Vitals:   10/09/17 1348  BP: 115/65  Pulse: 86  Resp: 18  Temp: (!) 97.2 F (36.2 C)  TempSrc: Oral  SpO2: 100%  Weight: 203 lb 11.4 oz (92.4 kg)  Height: 5\' 9"  (1.753 m)   Body mass index is 30.08 kg/m.  Physical Exam  GENERAL APPEARANCE: Well nourished. In no acute distress. Obese SKIN:  Chest vertical incision and 4 sugrical sites on upper abdomen  and right leg incision are all healed MOUTH and THROAT: Lips are without lesions. Oral mucosa is moist and without lesions.  RESPIRATORY: Breathing is even & unlabored, BS CTAB CARDIAC: RRR, no murmur,no extra heart sounds, no edema, Left upper arm with AV fistula, +bruit/thrill GI: Abdomen soft, normal BS, no masses, no tenderness EXTREMITIES:  Able to move X 4 extremities NEURO:   Slurred speech and generalized weakness PSYCHIATRIC: Alert to self, disoriented to time and place. Affect and behavior are appropriate   Labs reviewed: Recent Labs    09/19/17 1945 09/20/17 0400 09/20/17 1628  10/02/17 0718 10/05/17 0613 10/05/17 0615  10/07/17 1100 10/08/17 0247  NA  --  134*  --    < > 133* 134*  --  136 134*  K  --  4.0  --    < > 4.3 4.2  --  4.5 4.3  CL  --  93*  --    < > 93* 95*  --  97* 95*  CO2  --  23  --    < > 25 27  --  28 26  GLUCOSE  --  165*  --    < > 122* 83  --  96 193*  BUN  --  20  --    < > 77* 57*  --  38* 23*  CREATININE  --  5.19*  --    < > 7.65* 8.27*  --  8.16* 5.85*  CALCIUM  --  7.7*  --    < > 8.5* 8.4* 8.4* 8.3* 8.3*  MG 2.0 2.0 2.0  --   --   --   --   --   --   PHOS 3.6 4.1 2.6  --  5.8* 5.0*  --   --   --    < > = values in this interval not displayed.   Recent Labs    09/21/17 0320 09/23/17 0226 10/02/17 0718 10/05/17 0613 10/08/17 0247  AST 25 19  --   --  16  ALT 20 17  --   --  17  ALKPHOS 77 82  --   --  71  BILITOT 0.9 0.3  --   --  0.6  PROT 5.2* 5.5*  --   --  5.7*  ALBUMIN 2.2* 2.2* 2.2* 2.1* 2.5*   Recent Labs    12/15/16 1124 09/08/17 0046  10/02/17 0730 10/05/17 0615 10/08/17 0247  WBC 9.2 11.7*   < > 10.6* 10.0 9.2  NEUTROABS 5.8 9.6*  --   --   --  6.5  HGB 11.6* 11.6*   < > 8.3* 8.6* 10.0*  HCT 34.6* 34.8*   < > 24.8* 25.2* 29.7*  MCV 90.6 89.7   < > 82.7 80.8 82.5  PLT 166 145*   < > 413* 401* 477*   < > = values in this interval not displayed.   Lab Results  Component Value Date   TSH 1.064 07/12/2015   Lab Results  Component Value Date   HGBA1C 9.1 (H) 09/08/2017   Lab Results  Component Value Date   CHOL 116 10/28/2016   HDL 48 10/28/2016   LDLCALC 50 10/28/2016   TRIG 92 10/28/2016   CHOLHDL 2.4 10/28/2016    Significant Diagnostic Results in last 30 days:  Ct Angio Head W Or Wo Contrast  Result Date: 09/19/2017 CLINICAL DATA:  New onset of inability to speak or cancer questions. Lethargy. Evaluate for possible posterior circulation CVA. End-stage renal disease. EXAM: CT ANGIOGRAPHY HEAD AND NECK TECHNIQUE: Multidetector CT imaging of the head and neck was performed using the standard protocol during bolus administration of  intravenous contrast. Multiplanar CT image reconstructions and MIPs were obtained to evaluate the vascular anatomy. Carotid stenosis measurements (when applicable) are obtained utilizing NASCET criteria, using the distal internal carotid diameter as the denominator. CONTRAST:  123mL ISOVUE-370 IOPAMIDOL (ISOVUE-370) INJECTION 76% COMPARISON:  CT head 09/17/2017.  CTA head neck 10/28/2016. FINDINGS: CT HEAD FINDINGS Brain: No evidence of acute infarction, hemorrhage, hydrocephalus, extra-axial collection or mass lesion/mass effect. Atrophy with chronic microvascular ischemic change. Scattered parenchymal vascular calcifications. BILATERAL chronic cerebellar infarcts. Vascular: Reported separately. Skull: Extensive scalp calcifications associated with ESRD. No calvarial lesion. Sinuses: Unremarkable.  BILATERAL cataract extraction. Orbits: No acute finding. Review of the MIP images confirms the above findings CTA NECK FINDINGS Aortic arch: Standard branching. Imaged portion shows no evidence of aneurysm or dissection. No significant stenosis of the major arch vessel origins. Right carotid system: No evidence of dissection, stenosis (50% or greater) or occlusion. Non stenotic calcific plaque is observed at the carotid bifurcation. Left carotid system: No evidence of dissection, stenosis (50% or greater) or occlusion. Non stenotic calcific plaque is observed at the carotid bifurcation. Vertebral arteries: Codominant. No evidence of dissection, stenosis (50% or greater) or occlusion. Non stenotic calcific plaque is observed at the RIGHT vertebral origin. Skeleton: Advanced spondylosis. Other neck: No neck masses, other than the previously noted calcified greater than 2 cm LEFT thyroid nodule. Upper chest: Median sternotomy. Vascular congestion. BILATERAL pleural effusions. Review of the MIP images confirms the above findings CTA HEAD FINDINGS Anterior circulation: Non stenotic atheromatous change of the cavernous and  supraclinoid ICA segments. Hypoplastic RIGHT A1 ACA. Normal BILATERAL middle cerebral arteries. Posterior circulation: BILATERAL vertebral arteries are patent, codominant. No basilar stenosis. PCA segments are normal. No cerebellar branch occlusion. Venous sinuses: As permitted by contrast timing, patent. Anatomic variants: None. Delayed phase: No abnormal enhancement. Review of the MIP images confirms the above findings. Compared with prior CTA, similar appearance. IMPRESSION: Stable appearing intracranial and extracranial atherosclerotic disease. No flow-limiting stenosis or occlusion involving the carotid, vertebral, or basilar arteries. No abnormality is seen to suggest acute posterior circulation ischemic change. Electronically Signed   By: Staci Righter M.D.   On: 09/19/2017 12:41   Dg Chest 2 View  Result Date: 10/08/2017 CLINICAL DATA:  Acute onset of altered mental status. EXAM: CHEST  2 VIEW COMPARISON:  Chest radiograph performed 10/01/2017 FINDINGS: There is persistent left basilar and left midlung airspace opacity, concerning for  pneumonia. A small to moderate left-sided pleural effusion is again noted. The right lung appears relatively clear. No pneumothorax is seen. The cardiomediastinal silhouette is mildly enlarged. The patient is status post median sternotomy. A valve replacement is noted. No acute osseous abnormalities are seen. IMPRESSION: 1. Persistent left-sided pneumonia, with small to moderate left-sided pleural effusion. 2. Mild cardiomegaly. Electronically Signed   By: Garald Balding M.D.   On: 10/08/2017 03:13   Dg Chest 2 View  Result Date: 10/01/2017 CLINICAL DATA:  71 year old male postoperative day 17 status post CABG. EXAM: CHEST  2 VIEW COMPARISON:  09/25/2017 and earlier. FINDINGS: Semi upright AP and lateral views of the chest. Enteric tube removed. Low lung volumes. Moderate confluent opacity at the left lung base likely in part due to pleural effusion. Pulmonary  vascular congestion. No pneumothorax. No confluent opacity in the right lung. Stable cardiomegaly and mediastinal contours. Sequelae of CABG and mitral valve repair. Visualized tracheal air column is within normal limits. No acute osseous abnormality identified. Negative visible bowel gas pattern. IMPRESSION: 1. Low lung volumes with pulmonary vascular congestion. 2. Moderate left lung base opacity, probably a combination of pleural effusion and airspace disease. Electronically Signed   By: Genevie Ann M.D.   On: 10/01/2017 09:50   Ct Head Wo Contrast  Result Date: 10/08/2017 CLINICAL DATA:  Altered mental status EXAM: CT HEAD WITHOUT CONTRAST TECHNIQUE: Contiguous axial images were obtained from the base of the skull through the vertex without intravenous contrast. COMPARISON:  Head CT 09/19/2017 FINDINGS: Brain: No mass lesion, intraparenchymal hemorrhage or extra-axial collection. No evidence of acute cortical infarct. Basal ganglia of parenchymal calcification and scattered parenchymal vascular calcifications are unchanged. Old bilateral cerebellar infarcts. There is periventricular hypoattenuation compatible with chronic microvascular disease. Vascular: No hyperdense vessel or unexpected calcification. Skull: Normal visualized skull base, calvarium and extracranial soft tissues. Sinuses/Orbits: No sinus fluid levels or advanced mucosal thickening. No mastoid effusion. Normal orbits. IMPRESSION: 1. Unchanged appearance of the brain with old bilateral cerebellar infarcts, chronic microvascular ischemia and multifocal vascular calcification. 2. No acute intracranial abnormality. Electronically Signed   By: Ulyses Jarred M.D.   On: 10/08/2017 03:28   Ct Head Wo Contrast  Result Date: 09/17/2017 CLINICAL DATA:  Altered level of consciousness. History of end-stage renal disease on dialysis, hypertension, hyperlipidemia. EXAM: CT HEAD WITHOUT CONTRAST TECHNIQUE: Contiguous axial images were obtained from the base  of the skull through the vertex without intravenous contrast. COMPARISON:  CT HEAD October 18, 2016 FINDINGS: BRAIN: No intraparenchymal hemorrhage, mass effect nor midline shift. Mild parenchymal brain volume loss. No hydrocephalus. Patchy supratentorial white matter hypodensities old small bilateral cerebellar infarcts. Scattered parenchymal vascular calcifications. No acute large vascular territory infarcts. No abnormal extra-axial fluid collections. Basal cisterns are patent. VASCULAR: Moderate calcific atherosclerosis of the carotid siphons. SKULL: No skull fracture. Extensive scalp calcifications associated with end-stage renal disease. No significant scalp soft tissue swelling. SINUSES/ORBITS: The mastoid air-cells and included paranasal sinuses are well-aerated.The included ocular globes and orbital contents are non-suspicious. Status post bilateral ocular lens implants. OTHER: Multiple absent teeth. IMPRESSION: 1. No acute intracranial process. 2. Stable mild parenchymal brain volume loss. 3. Stable mild-to-moderate chronic small vessel ischemic disease and old small cerebellar infarcts. Electronically Signed   By: Elon Alas M.D.   On: 09/17/2017 20:42   Ct Angio Neck W Or Wo Contrast  Result Date: 09/19/2017 CLINICAL DATA:  New onset of inability to speak or cancer questions. Lethargy. Evaluate for possible posterior circulation CVA.  End-stage renal disease. EXAM: CT ANGIOGRAPHY HEAD AND NECK TECHNIQUE: Multidetector CT imaging of the head and neck was performed using the standard protocol during bolus administration of intravenous contrast. Multiplanar CT image reconstructions and MIPs were obtained to evaluate the vascular anatomy. Carotid stenosis measurements (when applicable) are obtained utilizing NASCET criteria, using the distal internal carotid diameter as the denominator. CONTRAST:  168mL ISOVUE-370 IOPAMIDOL (ISOVUE-370) INJECTION 76% COMPARISON:  CT head 09/17/2017.  CTA head neck  10/28/2016. FINDINGS: CT HEAD FINDINGS Brain: No evidence of acute infarction, hemorrhage, hydrocephalus, extra-axial collection or mass lesion/mass effect. Atrophy with chronic microvascular ischemic change. Scattered parenchymal vascular calcifications. BILATERAL chronic cerebellar infarcts. Vascular: Reported separately. Skull: Extensive scalp calcifications associated with ESRD. No calvarial lesion. Sinuses: Unremarkable.  BILATERAL cataract extraction. Orbits: No acute finding. Review of the MIP images confirms the above findings CTA NECK FINDINGS Aortic arch: Standard branching. Imaged portion shows no evidence of aneurysm or dissection. No significant stenosis of the major arch vessel origins. Right carotid system: No evidence of dissection, stenosis (50% or greater) or occlusion. Non stenotic calcific plaque is observed at the carotid bifurcation. Left carotid system: No evidence of dissection, stenosis (50% or greater) or occlusion. Non stenotic calcific plaque is observed at the carotid bifurcation. Vertebral arteries: Codominant. No evidence of dissection, stenosis (50% or greater) or occlusion. Non stenotic calcific plaque is observed at the RIGHT vertebral origin. Skeleton: Advanced spondylosis. Other neck: No neck masses, other than the previously noted calcified greater than 2 cm LEFT thyroid nodule. Upper chest: Median sternotomy. Vascular congestion. BILATERAL pleural effusions. Review of the MIP images confirms the above findings CTA HEAD FINDINGS Anterior circulation: Non stenotic atheromatous change of the cavernous and supraclinoid ICA segments. Hypoplastic RIGHT A1 ACA. Normal BILATERAL middle cerebral arteries. Posterior circulation: BILATERAL vertebral arteries are patent, codominant. No basilar stenosis. PCA segments are normal. No cerebellar branch occlusion. Venous sinuses: As permitted by contrast timing, patent. Anatomic variants: None. Delayed phase: No abnormal enhancement. Review of  the MIP images confirms the above findings. Compared with prior CTA, similar appearance. IMPRESSION: Stable appearing intracranial and extracranial atherosclerotic disease. No flow-limiting stenosis or occlusion involving the carotid, vertebral, or basilar arteries. No abnormality is seen to suggest acute posterior circulation ischemic change. Electronically Signed   By: Staci Righter M.D.   On: 09/19/2017 12:41   Dg Chest Port 1 View  Result Date: 09/25/2017 CLINICAL DATA:  71 year old male postoperative day 12 status post CABG. Prior NSTEMI. Shortness of breath. EXAM: PORTABLE CHEST 1 VIEW COMPARISON:  09/23/2017 and earlier. FINDINGS: Portable AP semi upright view at 0610 hours. Enteric feeding tube remains in place and courses to the abdomen, tip not included. Right subclavian introducer sheath remains. Continued confluent left lung base opacity obscuring the left hemidiaphragm. Stable pulmonary vascularity. No overt edema. No pneumothorax. Possible left pleural effusion. Mild streaky infrahilar right lung base opacity is stable. Stable cardiac size and mediastinal contours. Sequelae of CABG and cardiac valve replacement. IMPRESSION: 1. Continued left lower lobe collapse or consolidation. Possible left pleural effusion. 2. Stable pulmonary vascular congestion without overt edema. 3. Medial right lung base atelectasis. Electronically Signed   By: Genevie Ann M.D.   On: 09/25/2017 08:00   Dg Chest Port 1 View  Result Date: 09/23/2017 CLINICAL DATA:  Recent CABG.  Short of breath. EXAM: PORTABLE CHEST 1 VIEW COMPARISON:  09/20/2017 FINDINGS: Vascular congestion and pulmonary edema have increased. Left basilar pulmonary opacity unchanged. Low volumes. No pneumothorax. Feeding tube as been placed  with its tip beyond the gastroesophageal junction. Postoperative changes in the mediastinum are unchanged. Percutaneous pacemaker wires remain in place. IMPRESSION: Worsening vascular congestion and pulmonary edema.  Feeding tube placement. It extends beyond the lower limit of the study. Electronically Signed   By: Marybelle Killings M.D.   On: 09/23/2017 07:58   Dg Chest Port 1 View  Result Date: 09/20/2017 CLINICAL DATA:  Sore chest after CABG EXAM: PORTABLE CHEST 1 VIEW COMPARISON:  Two days ago FINDINGS: Low volume chest with atelectasis and probable pleural fluid on the left. Vascular congestion is improved from before. Right subclavian sheath and neighboring vascular stent. No evidence of pneumothorax. Stable cardiopericardial enlargement. Status post mitral valve annuloplasty and CABG. Stable vascular pedicle widening. IMPRESSION: 1. Improved pulmonary edema. 2. Unchanged retrocardiac atelectasis and probable effusion. Electronically Signed   By: Monte Fantasia M.D.   On: 09/20/2017 08:34   Dg Chest Port 1 View  Result Date: 09/18/2017 CLINICAL DATA:  Status post coronary bypass grafting EXAM: PORTABLE CHEST 1 VIEW COMPARISON:  09/16/2017 FINDINGS: Postsurgical changes are again seen. Cardiac shadow remains enlarged. Vascular congestion and patchy edema is again identified slightly worse on the left than the right. No new focal abnormality is seen. No bony abnormality is noted. IMPRESSION: Stable changes of CHF with pulmonary edema. Electronically Signed   By: Inez Catalina M.D.   On: 09/18/2017 09:14   Dg Chest Port 1 View  Result Date: 09/16/2017 CLINICAL DATA:  Status post coronary bypass graft. EXAM: PORTABLE CHEST 1 VIEW COMPARISON:  Radiograph September 15, 2017. FINDINGS: Stable cardiomegaly. Sternotomy wires are noted. No pneumothorax is noted. Mediastinal drains have been removed. Status post cardiac valve repair. Stable central pulmonary vascular congestion is noted with probable mild bilateral pulmonary edema. Bony thorax is unremarkable. IMPRESSION: Stable cardiomegaly and central pulmonary vascular congestion is noted with mild probable bilateral pulmonary edema. No pneumothorax is noted. Electronically  Signed   By: Marijo Conception, M.D.   On: 09/16/2017 07:31   Dg Chest Port 1 View  Result Date: 09/15/2017 CLINICAL DATA:  Chest tube EXAM: PORTABLE CHEST 1 VIEW COMPARISON:  09/14/2017 FINDINGS: Swan-Ganz catheter removal. Mediastinal drains remain in place. Prior CABG. Cardiomegaly with vascular congestion. Left base atelectasis with small left effusion. Slight improvement in pulmonary edema pattern. IMPRESSION: Continued left lower lobe atelectasis with left effusion. Mild edema pattern, slightly improved. Electronically Signed   By: Rolm Baptise M.D.   On: 09/15/2017 07:16   Dg Chest Port 1 View  Result Date: 09/14/2017 CLINICAL DATA:  Post CABG EXAM: PORTABLE CHEST 1 VIEW COMPARISON:  09/13/2017 FINDINGS: Interval extubation. Swan-Ganz catheter tip remains in the pulmonary outflow tract. Changes of CABG. Cardiomegaly with vascular congestion. Worsened aeration at the left base with increasing perihilar and left basilar atelectasis or edema. Mild right base atelectasis. Interstitial prominence likely reflects edema. IMPRESSION: Interval extubation.  Increasing left basilar opacity. Mild pulmonary edema and bibasilar atelectasis. No pneumothorax. Electronically Signed   By: Rolm Baptise M.D.   On: 09/14/2017 07:48   Dg Chest Port 1 View  Result Date: 09/13/2017 CLINICAL DATA:  CABG EXAM: PORTABLE CHEST 1 VIEW COMPARISON:  09/12/2017 FINDINGS: Endotracheal tube in good position. Bilateral chest tubes. Mitral valve replacement. Swan-Ganz catheter main pulmonary outflow tract. No pneumothorax Bilateral airspace disease similar to yesterday and most consistent with edema. Mild bibasilar atelectasis and small effusions IMPRESSION: Postop CABG and mitral valve replacement. Congestive heart failure with edema unchanged. Electronically Signed   By: Franchot Gallo  M.D.   On: 09/13/2017 15:11   Dg Chest Port 1 View  Result Date: 09/12/2017 CLINICAL DATA:  Shortness of breath. EXAM: PORTABLE CHEST 1 VIEW  COMPARISON:  09/10/2017 FINDINGS: The heart is enlarged but stable. Moderate interstitial and alveolar pulmonary edema and small effusion. Bibasilar atelectasis. The bony thorax is intact. IMPRESSION: Cardiac enlargement and CHF. Bibasilar atelectasis and small left effusion. Electronically Signed   By: Marijo Sanes M.D.   On: 09/12/2017 10:58   Dg Chest Port 1 View  Result Date: 09/10/2017 CLINICAL DATA:  Follow up CHF. Pre-op for CABG on 11/30. Pt unable to give hx due to condition at this time. EXAM: PORTABLE CHEST 1 VIEW COMPARISON:  09/08/2017 FINDINGS: Mildly degraded exam due to AP portable technique and patient body habitus. Patient rotated to the right. Midline trachea. Cardiomegaly accentuated by AP portable technique. Probable small left pleural effusion. Presumed calcified node projecting over the left side of the thoracic inlet. No pneumothorax. Improved mild interstitial edema. Right minor fissure thickening. Worsened left base aeration. IMPRESSION: Improved mild congestive heart failure. Small left pleural effusion with worsened adjacent airspace disease, most likely atelectasis. Electronically Signed   By: Abigail Miyamoto M.D.   On: 09/10/2017 15:34    Assessment/Plan  1. Generalized weakness - for rehabilitation, PT and OT, for therapeutic strengthening exercises, fall precautions   2. Coronary artery disease involving native coronary artery of native heart without angina pectoris - S/P CABG X 4 on 10/04/17, follow-up at cardiology clinic on 10/23/17 at 11 AM, weigh daily and notify provider for weight gain > 3lbs,    3. History of CVA with residual deficit - stable, continue Lopressor 50 mg give 1/2 tab = 25 mg  twice a day, aspirin 81 mg 1 tab daily, neurology consult with Dr. Metta Clines in 6 weeks    4. NSVT (nonsustained ventricular tachycardia) (HCC) - continue amiodarone 200 mg 1 tab twice a day and  Lopressor 50 mg give 1/2 tab = 12.5 mg 1 tab twice a day, atorvastatin 40 mg 1  tab daily, Coumadin 1 mg daily     5. Depression, recurrent ([place]) - mood is stable, continue amitriptyline 25 mg 1 tab daily at bedtime   6. Seizure, late effect of stroke ([place]) - continue Keppra 1000 mg 1 tab daily and Keppra 500 mg 1 tab every Monday, Wednesday and Friday after dialysis   7. Anemia due to chronic kidney disease, on chronic dialysis ([place]) - continue darbepoetin alpha 100 g/0.5 mL injection on Mondays at dialysis and ferric gluconate 250 mg injection into the vein at dialysis on MWF Lab Results  Component Value Date   HGB 10.0 (L) 10/08/2017     8. Long term current use of anticoagulant - INR 2.1, therapeutic, continue Coumadin 1 mg daily, check INR on 10/16/17, INR goal 2-2.5   9. ESRD - continue hemodialysis 3 times/week (TThSat), continue calcium acetate 667 mg give 3 capsules 3 times a day, Sensipar 30 mg give 4 tabs = 120 mg daily with supper  10. 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   11. Essential hypertension -  continue Lopressor 50 mg give 1/2 tab = 25 mg 1 tab twice a day     Family/ staff Communication:  Discussed plan of care with charge nurse  Labs/tests ordered:  INR on  10/16/17  Goals of  care:   Short-term rehabilitation   Durenda Age, NP [facility] [phone] (Monday-Friday 8:00 a.m. - 5:00 p.m.) [phone] (after hours)

## 2017-10-10 ENCOUNTER — Encounter: Payer: Self-pay | Admitting: Internal Medicine

## 2017-10-10 ENCOUNTER — Non-Acute Institutional Stay (SKILLED_NURSING_FACILITY): Payer: Medicare Other | Admitting: Internal Medicine

## 2017-10-10 DIAGNOSIS — T82898A Other specified complication of vascular prosthetic devices, implants and grafts, initial encounter: Secondary | ICD-10-CM

## 2017-10-10 DIAGNOSIS — IMO0002 Reserved for concepts with insufficient information to code with codable children: Secondary | ICD-10-CM

## 2017-10-10 DIAGNOSIS — I214 Non-ST elevation (NSTEMI) myocardial infarction: Secondary | ICD-10-CM | POA: Diagnosis not present

## 2017-10-10 DIAGNOSIS — E1165 Type 2 diabetes mellitus with hyperglycemia: Secondary | ICD-10-CM

## 2017-10-10 DIAGNOSIS — Z992 Dependence on renal dialysis: Secondary | ICD-10-CM

## 2017-10-10 DIAGNOSIS — F0151 Vascular dementia with behavioral disturbance: Secondary | ICD-10-CM | POA: Diagnosis not present

## 2017-10-10 DIAGNOSIS — E1122 Type 2 diabetes mellitus with diabetic chronic kidney disease: Secondary | ICD-10-CM

## 2017-10-10 DIAGNOSIS — I4892 Unspecified atrial flutter: Secondary | ICD-10-CM | POA: Diagnosis not present

## 2017-10-10 DIAGNOSIS — N186 End stage renal disease: Secondary | ICD-10-CM

## 2017-10-10 DIAGNOSIS — F0150 Vascular dementia without behavioral disturbance: Secondary | ICD-10-CM | POA: Insufficient documentation

## 2017-10-10 DIAGNOSIS — F01518 Vascular dementia, unspecified severity, with other behavioral disturbance: Secondary | ICD-10-CM

## 2017-10-10 NOTE — Assessment & Plan Note (Signed)
Glucoses at the SNF range from 89 up to 247, control appears adequate. Most importantly is to avoid hypoglycemia which would mimic a TIA

## 2017-10-10 NOTE — Assessment & Plan Note (Addendum)
Patient has been found out of bed on the floor twice since admission to the SNF D/C Elavil and initiate low dose Sertraline as trial Psych evaluation at next SNF site visit; hopefully atypical antipsychotics can be avoided

## 2017-10-10 NOTE — Assessment & Plan Note (Signed)
Continue present hemodialysis schedule

## 2017-10-10 NOTE — Assessment & Plan Note (Addendum)
Active cardiopulmonary pulmonary symptoms denied at this time but dementia impacts veracity of hx

## 2017-10-10 NOTE — Patient Instructions (Addendum)
See assessment and plan under each diagnosis in the problem list and acutely for this visit Total time 51 minutes; greater than 50% of the visit spent reviewing extensive medical record ,counseling patient (re ambulation w/o help) and coordinating care for problems addressed at this encounter
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
## 2017-10-10 NOTE — Assessment & Plan Note (Signed)
Maintain therapeutic PT/INR

## 2017-10-11 DIAGNOSIS — D509 Iron deficiency anemia, unspecified: Secondary | ICD-10-CM | POA: Diagnosis not present

## 2017-10-11 DIAGNOSIS — D631 Anemia in chronic kidney disease: Secondary | ICD-10-CM | POA: Diagnosis not present

## 2017-10-11 DIAGNOSIS — N2581 Secondary hyperparathyroidism of renal origin: Secondary | ICD-10-CM | POA: Diagnosis not present

## 2017-10-11 DIAGNOSIS — N186 End stage renal disease: Secondary | ICD-10-CM | POA: Diagnosis not present

## 2017-10-11 LAB — POCT INR: INR: 1.2 — AB (ref 0.9–1.1)

## 2017-10-11 LAB — PROTIME-INR: PROTIME: 15.4 — AB (ref 10.0–13.8)

## 2017-10-13 DIAGNOSIS — N186 End stage renal disease: Secondary | ICD-10-CM | POA: Diagnosis not present

## 2017-10-13 DIAGNOSIS — D631 Anemia in chronic kidney disease: Secondary | ICD-10-CM | POA: Diagnosis not present

## 2017-10-13 DIAGNOSIS — D509 Iron deficiency anemia, unspecified: Secondary | ICD-10-CM | POA: Diagnosis not present

## 2017-10-13 DIAGNOSIS — N2581 Secondary hyperparathyroidism of renal origin: Secondary | ICD-10-CM | POA: Diagnosis not present

## 2017-10-14 ENCOUNTER — Other Ambulatory Visit: Payer: Self-pay | Admitting: *Deleted

## 2017-10-14 DIAGNOSIS — E1129 Type 2 diabetes mellitus with other diabetic kidney complication: Secondary | ICD-10-CM | POA: Diagnosis not present

## 2017-10-14 DIAGNOSIS — N186 End stage renal disease: Secondary | ICD-10-CM | POA: Diagnosis not present

## 2017-10-14 DIAGNOSIS — Z992 Dependence on renal dialysis: Secondary | ICD-10-CM | POA: Diagnosis not present

## 2017-10-14 LAB — POCT INR: INR: 1.4 — AB (ref 0.9–1.1)

## 2017-10-14 LAB — PROTIME-INR: Protime: 16.7 — AB (ref 10.0–13.8)

## 2017-10-14 NOTE — Patient Outreach (Signed)
[facility]) Care Management  10/14/2017  Ernest [name] 1946-03-02 [id number]   Referral received from [place] to follow While at [facility]. Patient has been identified by inpatient case management MD Director as high risk for readmission. Patient also identified to have [place] for their [place] (High Risk Initiative program) upon dc from [facility].    Plan: This social worker will follow patient while in skilled care at [facility] on 10/16/17 to assist with discharge planning.   [name] [facility] Care Management [phone]

## 2017-10-16 ENCOUNTER — Encounter: Payer: Self-pay | Admitting: *Deleted

## 2017-10-16 ENCOUNTER — Other Ambulatory Visit: Payer: Self-pay | Admitting: *Deleted

## 2017-10-16 DIAGNOSIS — D509 Iron deficiency anemia, unspecified: Secondary | ICD-10-CM | POA: Diagnosis not present

## 2017-10-16 DIAGNOSIS — E119 Type 2 diabetes mellitus without complications: Secondary | ICD-10-CM | POA: Diagnosis not present

## 2017-10-16 DIAGNOSIS — D631 Anemia in chronic kidney disease: Secondary | ICD-10-CM | POA: Diagnosis not present

## 2017-10-16 DIAGNOSIS — N186 End stage renal disease: Secondary | ICD-10-CM | POA: Diagnosis not present

## 2017-10-16 DIAGNOSIS — N2581 Secondary hyperparathyroidism of renal origin: Secondary | ICD-10-CM | POA: Diagnosis not present

## 2017-10-16 NOTE — Patient Outreach (Signed)
  [facility] [facility]) Care Management  [facility] Social Work  10/16/2017  Ernest [name] 07/26/1946 [id number]  Subjective:  Patient is a 72 year old male currently in rehab at [facility] following a hospitalization for Heart Failure. Patient states that he is participating in PT and OT daily as they are helping strengthen his legs. Per patient, wife is supportive, not at bedside but will visit on Friday. Per patient, he plans to return home following his rehab stay.  Objective:   Encounter Medications:  Outpatient Encounter Medications as of 10/16/2017  Medication Sig  . amiodarone (PACERONE) 200 MG tablet Take 1 tablet (200 mg total) by mouth 2 (two) times daily.  [name] amitriptyline (ELAVIL) 25 MG tablet TAKE ONE TABLET BY MOUTH AT BEDTIME FOR  NEUROPATHY  . aspirin EC 81 MG tablet Take 1 tablet (81 mg total) by mouth daily.  [name] atorvastatin (LIPITOR) 40 MG tablet Take 40 mg by mouth daily.  . B Complex-C-Folic Acid (NEPHRO-VITE PO) Take 1 tablet by mouth daily.  . calcium acetate (PHOSLO) 667 MG capsule Take 2,001 mg by mouth 3 (three) times daily with meals.  . cinacalcet (SENSIPAR) 30 MG tablet Take 4 tablets (120 mg total) by mouth daily with supper.  . Darbepoetin Alfa (ARANESP) 100 MCG/0.5ML SOSY injection Inject 0.5 mLs (100 mcg total) into the vein every Monday with hemodialysis.  [name] doxercalciferol (HECTOROL) 4 MCG/2ML injection Inject 1 mL (2 mcg total) into the vein every Monday, Wednesday, and Friday with hemodialysis.  . ferric gluconate 250 mg in sodium chloride 0.9 % 100 mL Inject 250 mg into the vein every Monday, Wednesday, and Friday with hemodialysis.  [name] insulin detemir (LEVEMIR) 100 UNIT/ML injection Inject 0.2 mLs (20 Units total) into the skin 2 (two) times daily.  [name] levETIRAcetam (KEPPRA) 500 MG tablet Take 2 tablets (1,000 mg total) by mouth daily. Then take extra 500 mg (1 tablet) on MWF after HD  . metoprolol tartrate (LOPRESSOR) 25 MG tablet Take 1 tablet (25 mg  total) by mouth 2 (two) times daily.  . Nutritional Supplements (FEEDING SUPPLEMENT, NEPRO CARB STEADY,) LIQD Take 237 mLs by mouth 2 (two) times daily between meals.  . warfarin (COUMADIN) 1 MG tablet Take 1 tablet (1 mg total) by mouth daily. As directed to keep INR between 2.0 and 2.5   No facility-administered encounter medications on file as of 10/16/2017.     Functional Status:  In your present state of health, do you have any difficulty performing the following activities: 09/11/2017 09/09/2017  Hearing? Y N  Vision? Y N  Difficulty concentrating or making decisions? N N  Walking or climbing stairs? Y -  Dressing or bathing? Y -  Doing errands, shopping? N N  Some recent data might be hidden    Fall/Depression Screening:  PHQ 2/9 Scores 11/22/2015 08/30/2015 08/11/2015 08/04/2015 08/04/2015 07/28/2013 07/06/2013  PHQ - 2 Score 0 0 0 1 1 0 0    Assessment: Patient was asleep when this social worker arrived. Patient very difficult to understand, speech slurred/muffled. Despite speech dificulties, patient made efforts to speak with this Education officer, museum.  Patient was not able to verbalize discharge plan  Discharge [facility] [phone] not available during visit.    Plan: This social worker will contact discharge planner, Cameron Ali and reiterate that patient has been referred to the high risk initiative program  through [place], [place] Management [phone]

## 2017-10-18 DIAGNOSIS — D631 Anemia in chronic kidney disease: Secondary | ICD-10-CM | POA: Diagnosis not present

## 2017-10-18 DIAGNOSIS — N186 End stage renal disease: Secondary | ICD-10-CM | POA: Diagnosis not present

## 2017-10-18 DIAGNOSIS — D509 Iron deficiency anemia, unspecified: Secondary | ICD-10-CM | POA: Diagnosis not present

## 2017-10-18 DIAGNOSIS — N2581 Secondary hyperparathyroidism of renal origin: Secondary | ICD-10-CM | POA: Diagnosis not present

## 2017-10-18 DIAGNOSIS — E119 Type 2 diabetes mellitus without complications: Secondary | ICD-10-CM | POA: Diagnosis not present

## 2017-10-18 LAB — PROTIME-INR: Protime: 14.5 — AB (ref 10.0–13.8)

## 2017-10-18 LAB — POCT INR: INR: 1.2 — AB (ref 0.9–1.1)

## 2017-10-21 DIAGNOSIS — E119 Type 2 diabetes mellitus without complications: Secondary | ICD-10-CM | POA: Diagnosis not present

## 2017-10-21 DIAGNOSIS — N2581 Secondary hyperparathyroidism of renal origin: Secondary | ICD-10-CM | POA: Diagnosis not present

## 2017-10-21 DIAGNOSIS — D631 Anemia in chronic kidney disease: Secondary | ICD-10-CM | POA: Diagnosis not present

## 2017-10-21 DIAGNOSIS — D509 Iron deficiency anemia, unspecified: Secondary | ICD-10-CM | POA: Diagnosis not present

## 2017-10-21 DIAGNOSIS — N186 End stage renal disease: Secondary | ICD-10-CM | POA: Diagnosis not present

## 2017-10-21 LAB — PROTIME-INR: Protime: 15 — AB (ref 10.0–13.8)

## 2017-10-21 LAB — POCT INR: INR: 1.2 — AB (ref 0.9–1.1)

## 2017-10-22 ENCOUNTER — Encounter: Payer: Self-pay | Admitting: *Deleted

## 2017-10-22 ENCOUNTER — Other Ambulatory Visit: Payer: Self-pay | Admitting: *Deleted

## 2017-10-22 NOTE — Progress Notes (Signed)
1/7/1 

## 2017-10-22 NOTE — Patient Outreach (Signed)
[facility]) Care Management  10/22/2017  Ernest [name] [birth year] [id number]   Phone call to [facility] to discuss patient's progress in rehab as well as to confirm referral to  [place]  high risk initiative program. Voicemail message left for discharge planner, Cameron Ali requesting a return call.   [name] [facility] Care Management [phone]

## 2017-10-23 ENCOUNTER — Ambulatory Visit: Payer: Medicare Other | Admitting: Nurse Practitioner

## 2017-10-23 DIAGNOSIS — D509 Iron deficiency anemia, unspecified: Secondary | ICD-10-CM | POA: Diagnosis not present

## 2017-10-23 DIAGNOSIS — E119 Type 2 diabetes mellitus without complications: Secondary | ICD-10-CM | POA: Diagnosis not present

## 2017-10-23 DIAGNOSIS — D631 Anemia in chronic kidney disease: Secondary | ICD-10-CM | POA: Diagnosis not present

## 2017-10-23 DIAGNOSIS — N2581 Secondary hyperparathyroidism of renal origin: Secondary | ICD-10-CM | POA: Diagnosis not present

## 2017-10-23 DIAGNOSIS — N186 End stage renal disease: Secondary | ICD-10-CM | POA: Diagnosis not present

## 2017-10-24 LAB — POCT INR: INR: 1.4 — AB (ref 0.9–1.1)

## 2017-10-24 LAB — PROTIME-INR: PROTIME: 16.5 — AB (ref 10.0–13.8)

## 2017-10-25 DIAGNOSIS — E119 Type 2 diabetes mellitus without complications: Secondary | ICD-10-CM | POA: Diagnosis not present

## 2017-10-25 DIAGNOSIS — N186 End stage renal disease: Secondary | ICD-10-CM | POA: Diagnosis not present

## 2017-10-25 DIAGNOSIS — D631 Anemia in chronic kidney disease: Secondary | ICD-10-CM | POA: Diagnosis not present

## 2017-10-25 DIAGNOSIS — N2581 Secondary hyperparathyroidism of renal origin: Secondary | ICD-10-CM | POA: Diagnosis not present

## 2017-10-25 DIAGNOSIS — D509 Iron deficiency anemia, unspecified: Secondary | ICD-10-CM | POA: Diagnosis not present

## 2017-10-28 DIAGNOSIS — D509 Iron deficiency anemia, unspecified: Secondary | ICD-10-CM | POA: Diagnosis not present

## 2017-10-28 DIAGNOSIS — N2581 Secondary hyperparathyroidism of renal origin: Secondary | ICD-10-CM | POA: Diagnosis not present

## 2017-10-28 DIAGNOSIS — N186 End stage renal disease: Secondary | ICD-10-CM | POA: Diagnosis not present

## 2017-10-28 DIAGNOSIS — E119 Type 2 diabetes mellitus without complications: Secondary | ICD-10-CM | POA: Diagnosis not present

## 2017-10-28 DIAGNOSIS — D631 Anemia in chronic kidney disease: Secondary | ICD-10-CM | POA: Diagnosis not present

## 2017-10-29 ENCOUNTER — Encounter (HOSPITAL_COMMUNITY): Payer: Self-pay | Admitting: Vascular Surgery

## 2017-10-29 ENCOUNTER — Other Ambulatory Visit: Payer: Self-pay | Admitting: *Deleted

## 2017-10-29 LAB — POCT INR: INR: 1.5 — AB (ref 0.9–1.1)

## 2017-10-29 LAB — PROTIME-INR: PROTIME: 17.7 — AB (ref 10.0–13.8)

## 2017-10-30 DIAGNOSIS — D509 Iron deficiency anemia, unspecified: Secondary | ICD-10-CM | POA: Diagnosis not present

## 2017-10-30 DIAGNOSIS — D631 Anemia in chronic kidney disease: Secondary | ICD-10-CM | POA: Diagnosis not present

## 2017-10-30 DIAGNOSIS — N2581 Secondary hyperparathyroidism of renal origin: Secondary | ICD-10-CM | POA: Diagnosis not present

## 2017-10-30 DIAGNOSIS — N186 End stage renal disease: Secondary | ICD-10-CM | POA: Diagnosis not present

## 2017-10-30 DIAGNOSIS — E119 Type 2 diabetes mellitus without complications: Secondary | ICD-10-CM | POA: Diagnosis not present

## 2017-10-30 NOTE — Patient Outreach (Addendum)
[facility]) Care Management  [facility] Social Work  10/30/2017  Ernest [name] 1946-08-07 [id number]  Subjective:  Patient is a 72 year old male who continues to receive rehab at [facility]. Per patient, he is unsure of his discharge date but he remains active in therapy.  Objective:   Encounter Medications:  Outpatient Encounter Medications as of 10/29/2017  Medication Sig  . amiodarone (PACERONE) 200 MG tablet Take 1 tablet (200 mg total) by mouth 2 (two) times daily.  [name] amitriptyline (ELAVIL) 25 MG tablet TAKE ONE TABLET BY MOUTH AT BEDTIME FOR  NEUROPATHY  . aspirin EC 81 MG tablet Take 1 tablet (81 mg total) by mouth daily.  [name] atorvastatin (LIPITOR) 40 MG tablet Take 40 mg by mouth daily.  . B Complex-C-Folic Acid (NEPHRO-VITE PO) Take 1 tablet by mouth daily.  . calcium acetate (PHOSLO) 667 MG capsule Take 2,001 mg by mouth 3 (three) times daily with meals.  . cinacalcet (SENSIPAR) 30 MG tablet Take 4 tablets (120 mg total) by mouth daily with supper.  . Darbepoetin Alfa (ARANESP) 100 MCG/0.5ML SOSY injection Inject 0.5 mLs (100 mcg total) into the vein every Monday with hemodialysis.  [name] doxercalciferol (HECTOROL) 4 MCG/2ML injection Inject 1 mL (2 mcg total) into the vein every Monday, Wednesday, and Friday with hemodialysis.  . ferric gluconate 250 mg in sodium chloride 0.9 % 100 mL Inject 250 mg into the vein every Monday, Wednesday, and Friday with hemodialysis.  [name] insulin detemir (LEVEMIR) 100 UNIT/ML injection Inject 0.2 mLs (20 Units total) into the skin 2 (two) times daily.  [name] levETIRAcetam (KEPPRA) 500 MG tablet Take 2 tablets (1,000 mg total) by mouth daily. Then take extra 500 mg (1 tablet) on MWF after HD  . metoprolol tartrate (LOPRESSOR) 25 MG tablet Take 1 tablet (25 mg total) by mouth 2 (two) times daily.  . Nutritional Supplements (FEEDING SUPPLEMENT, NEPRO CARB STEADY,) LIQD Take 237 mLs by mouth 2 (two) times daily between meals.  . warfarin  (COUMADIN) 1 MG tablet Take 1 tablet (1 mg total) by mouth daily. As directed to keep INR between 2.0 and 2.5   No facility-administered encounter medications on file as of 10/29/2017.     Functional Status:  In your present state of health, do you have any difficulty performing the following activities: 10/16/2017 09/11/2017  Hearing? Tempie Donning  Vision? Y Y  Difficulty concentrating or making decisions? N N  Walking or climbing stairs? Y Y  Dressing or bathing? Y Y  Doing errands, shopping? Y N  Preparing Food and eating ? Y -  Using the Toilet? Y -  In the past six months, have you accidently leaked urine? Y -  Do you have problems with loss of bowel control? Y -  Managing your Medications? Y -  Managing your Finances? Y -  Housekeeping or managing your Housekeeping? Y -  Some recent data might be hidden    Fall/Depression Screening:  PHQ 2/9 Scores 10/16/2017 11/22/2015 08/30/2015 08/11/2015 08/04/2015 08/04/2015 07/28/2013  PHQ - 2 Score 0 0 0 0 1 1 0    Assessment:  Patient resting in bed, his speech is clearer today. This social worker spoke with Tillie Rung the discharge planner, who states that there is no discharge date set yet, however patient's wife plans to take him home. She has requested assistance with getting a chair lift as they reside in a 2 story home. However, no community resources to meet this need have been identified.  The discharge planner has been reminded that patient has been identified as high risk and has been referred for the [place] program post discharge from [place].  Plans made to have a family care planning meeting to further discuss patient's care needs post discharge from [facility]. The discharge planner will schedule.  Plan: This social worker to participate in the family care planning meeting to further discuss discharge needs.   [name] [facility] Care Management [phone]

## 2017-10-31 ENCOUNTER — Non-Acute Institutional Stay (SKILLED_NURSING_FACILITY): Payer: Medicare Other | Admitting: Adult Health

## 2017-10-31 ENCOUNTER — Encounter: Payer: Self-pay | Admitting: Adult Health

## 2017-10-31 DIAGNOSIS — F0151 Vascular dementia with behavioral disturbance: Secondary | ICD-10-CM | POA: Diagnosis not present

## 2017-10-31 DIAGNOSIS — Z992 Dependence on renal dialysis: Secondary | ICD-10-CM | POA: Diagnosis not present

## 2017-10-31 DIAGNOSIS — R569 Unspecified convulsions: Secondary | ICD-10-CM

## 2017-10-31 DIAGNOSIS — I69398 Other sequelae of cerebral infarction: Secondary | ICD-10-CM

## 2017-10-31 DIAGNOSIS — F339 Major depressive disorder, recurrent, unspecified: Secondary | ICD-10-CM | POA: Diagnosis not present

## 2017-10-31 DIAGNOSIS — D631 Anemia in chronic kidney disease: Secondary | ICD-10-CM

## 2017-10-31 DIAGNOSIS — K5901 Slow transit constipation: Secondary | ICD-10-CM | POA: Diagnosis not present

## 2017-10-31 DIAGNOSIS — N186 End stage renal disease: Secondary | ICD-10-CM | POA: Diagnosis not present

## 2017-10-31 DIAGNOSIS — I2510 Atherosclerotic heart disease of native coronary artery without angina pectoris: Secondary | ICD-10-CM | POA: Diagnosis not present

## 2017-10-31 DIAGNOSIS — E1122 Type 2 diabetes mellitus with diabetic chronic kidney disease: Secondary | ICD-10-CM

## 2017-10-31 DIAGNOSIS — I10 Essential (primary) hypertension: Secondary | ICD-10-CM | POA: Diagnosis not present

## 2017-10-31 DIAGNOSIS — R531 Weakness: Secondary | ICD-10-CM

## 2017-10-31 DIAGNOSIS — I6930 Unspecified sequelae of cerebral infarction: Secondary | ICD-10-CM

## 2017-10-31 DIAGNOSIS — Z794 Long term (current) use of insulin: Secondary | ICD-10-CM

## 2017-10-31 DIAGNOSIS — F01518 Vascular dementia, unspecified severity, with other behavioral disturbance: Secondary | ICD-10-CM

## 2017-10-31 DIAGNOSIS — I4892 Unspecified atrial flutter: Secondary | ICD-10-CM | POA: Diagnosis not present

## 2017-10-31 NOTE — Progress Notes (Signed)
Location:  [facility] Room Number: 124-A Place of Service:  SNF (31) Provider:  Durenda Age, NP  Patient Care Team: Patient, [name] as PCP - General (General Practice) Corliss Parish, MD as PCP - Internal Medicine (Nephrology) Zadie Rhine Clent Demark, MD as Consulting Physician (Ophthalmology) Vern Claude, [place] as [place] Management  Extended Emergency Contact Information Primary Emergency Contact: Marylyn Ishihara Address: [address]          [place], [place] [address] [place] of [place] Phone: [phone] Mobile Phone: [phone] Relation: Spouse Secondary Emergency Contact: Alean Rinne Address: [address] [place] [address] [place] of [place] Phone: [phone] Mobile Phone: [phone] Relation: Daughter  Code Status:  Full Code  Goals of care: Advanced Directive information Advanced Directives 10/16/2017  Does Patient Have a Medical Advance Directive? No  Type of Advance Directive -  Does patient want to make changes to medical advance directive? -  Copy of [place] in Chart? -  Would patient like information on creating a medical advance directive? -  Pre-existing out of facility DNR order (yellow form or pink MOST form) -     Chief Complaint  Patient presents with  . Medical Management of Chronic Issues    Routine [facility] visit    HPI:  Pt is a 72 y.o. male seen today for medical management of chronic diseases.  He is a short-term rehabilitation resident at [facility].  He has a PMH of cerebral embolism with cerebral infarction, NSVT, ESRD on HD, TIA, OSA, HLD with type 2 DM, and CAD. He was seen on the hallway while sitting on his wheelchair. He was waiting for his coffee. He complained of constipation.   He was admitted to [facility] on 10/08/17 from [facility] 09/07/17 to 10/07/17  for acute on chronic systolic and diastolic heart failure. He was having worsening SOB. He underwent cardiac catheterization on 09/09/17 and was found to have significant CAD. He had CABG X 4 on 09/13/17. He, also, required mitral valve repair. Repeat CT scan was obtained and was stable from his baseline scan. Vascular surgery perform a fistulogram on 10/04/17 and revealed a significant stenosis of the subclavian/innominate junction and angioplasty was performed. He was sent to the ED on 10/08/17 due to confusion and a fall. Imaging and labs did not reveal any significant findings.    Past Medical History:  Diagnosis Date  . Abnormal stress test    s/p cath November 2013 with modest disease involving the ostial left main, proximal LAD, proximal RCA - do not appear to be hemodynamically signficant; mild LV dysfunction  . Bacteremia   . Chronic systolic CHF (congestive heart failure) ([place])   . COPD (chronic obstructive pulmonary disease) ([place])   . Coronary artery disease    per cath report 2013  . Ejection fraction < 50%   . Erectile dysfunction    penile implant  . ESRD (end stage renal disease) ([place])    Started HD in [place] in 2009, ESRD was due to DM. Moved to [facility] in Dec 2009 and now gets dialysis at [facility] on a MWF schedule.     . Gout    Hx: of  . Hepatitis C   . Hyperlipidemia   . Hypertension   . Obesity   . Retinopathy   . Shortness of breath    Hx:  of with exertion  . Stroke ([place])   . Type II or unspecified type diabetes mellitus without mention of complication, not stated as uncontrolled    adult onset   Past Surgical History:  Procedure Laterality Date  . A/V FISTULAGRAM N/A 10/04/2017   Procedure: A/V FISTULAGRAM;  Surgeon: Elam Dutch, MD;  Location: [facility] CV LAB;  Service: Cardiovascular;  Laterality: N/A;  . AV FISTULA PLACEMENT Left 01/13/2013   Procedure: INSERTION OF ARTERIOVENOUS (AV) GORE-TEX GRAFT ARM;  Surgeon: Angelia Mould, MD;   Location: [facility];  Service: Vascular;  Laterality: Left;  . AV FISTULA PLACEMENT Left 05/05/2013   Procedure: INSERTION OF LEFT UPPER ARM  ARTERIOVENOUS GORTEX GRAFT;  Surgeon: Angelia Mould, MD;  Location: [facility];  Service: Vascular;  Laterality: Left;  . [place] REMOVAL Left 03/26/2013   Procedure: REMOVAL OF  NON INCORPORATED ARTERIOVENOUS GORETEX GRAFT ([place]) left arm * repair of  left brachial artery with vein patch angioplasty.;  Surgeon: Angelia Mould, MD;  Location: [facility];  Service: Vascular;  Laterality: Left;  . CARDIAC CATHETERIZATION  August 26, 2012  . CATARACT EXTRACTION W/ INTRAOCULAR LENS  IMPLANT, BILATERAL    . COLONOSCOPY    . CORONARY ARTERY BYPASS GRAFT N/A 09/13/2017   Procedure: CORONARY ARTERY BYPASS GRAFTING (CABG) times four LIMA  to LAD, SVG sequentially to OM and RAMUS Intermediate and SVG to Acute Marginal;  Surgeon: Melrose Nakayama, MD;  Location: [facility];  Service: Open Heart Surgery;  Laterality: N/A;  . EMBOLECTOMY Right 08/27/2013   Procedure: EMBOLECTOMY;  Surgeon: Mal Misty, MD;  Location: [facility];  Service: Vascular;  Laterality: Right;  Thrombectomy of Radial and ulnar artery.  [name] EYE SURGERY     laser.  and surgery for DM  . fistula     RUE and wrist  . INSERTION OF DIALYSIS CATHETER Right 01/01/2013   Procedure: INSERTION OF DIALYSIS CATHETER right  internal jugular;  Surgeon: Serafina Mitchell, MD;  Location: [facility];  Service: Vascular;  Laterality: Right;  . INSERTION OF DIALYSIS CATHETER N/A 03/26/2013   Procedure: INSERTION OF DIALYSIS CATHETER Left internal jugular vein;  Surgeon: Angelia Mould, MD;  Location: [facility];  Service: Vascular;  Laterality: N/A;  . LEFT HEART CATH AND CORONARY ANGIOGRAPHY N/A 09/09/2017   Procedure: LEFT HEART CATH AND CORONARY ANGIOGRAPHY;  Surgeon: Troy Sine, MD;  Location: [facility] CV LAB;  Service: Cardiovascular;  Laterality: N/A;  . LIGATION OF ARTERIOVENOUS  FISTULA Right 12/30/2013    Procedure: REMOVAL OF SEGMENT OF GORTEX GRAFT AND FISTULA  AND REPAIR OF BRACHIAL ARTERY;  Surgeon: Mal Misty, MD;  Location: [facility];  Service: Vascular;  Laterality: Right;  . MITRAL VALVE REPAIR N/A 09/13/2017   Procedure: MITRAL VALVE REPAIR (MVR);  Surgeon: Melrose Nakayama, MD;  Location: [facility];  Service: Open Heart Surgery;  Laterality: N/A;  . PENILE PROSTHESIS IMPLANT     1997.. no card  . PERIPHERAL VASCULAR BALLOON ANGIOPLASTY Left 10/04/2017   Procedure: PERIPHERAL VASCULAR BALLOON ANGIOPLASTY;  Surgeon: Elam Dutch, MD;  Location: [place] [facility];  Service: Cardiovascular;  Laterality: Left;  AVF  . TEE WITHOUT CARDIOVERSION N/A 06/11/2013   Procedure: TRANSESOPHAGEAL ECHOCARDIOGRAM (TEE);  Surgeon: Larey Dresser, MD;  Location: [facility] ENDOSCOPY;  Service: Cardiovascular;  Laterality: N/A;  . TEE WITHOUT CARDIOVERSION N/A 09/13/2017   Procedure: TRANSESOPHAGEAL ECHOCARDIOGRAM (TEE);  Surgeon: Melrose Nakayama, MD;  Location: [facility];  Service:  Open Heart Surgery;  Laterality: N/A;  . THROMBECTOMY W/ EMBOLECTOMY Left 03/26/2013   Procedure: Attempted thrombectomy of left arm arteriovenous goretex graft.;  Surgeon: Angelia Mould, MD;  Location: [facility];  Service: Vascular;  Laterality: Left;  [name] VENOGRAM N/A 04/07/2013   Procedure: VENOGRAM;  Surgeon: Serafina Mitchell, MD;  Location: [facility] CATH LAB;  Service: Cardiovascular;  Laterality: N/A;    [name]  Outpatient Encounter Medications as of 10/31/2017  Medication Sig  . amiodarone (PACERONE) 200 MG tablet Take 1 tablet (200 mg total) by mouth 2 (two) times daily.  [name] aspirin EC 81 MG tablet Take 1 tablet (81 mg total) by mouth daily.  [name] atorvastatin (LIPITOR) 40 MG tablet Take 40 mg by mouth daily.   . B Complex-C-Folic Acid (NEPHRO-VITE PO) Take 1 tablet by mouth daily.  . calcium acetate (PHOSLO) 667 MG capsule Take 2,001 mg by mouth 3 (three) times daily with meals.  . cinacalcet (SENSIPAR) 30 MG  tablet Take 4 tablets (120 mg total) by mouth daily with supper.  . Darbepoetin Alfa (ARANESP) 100 MCG/0.5ML SOSY injection Inject 0.5 mLs (100 mcg total) into the vein every Monday with hemodialysis.  [name] doxercalciferol (HECTOROL) 4 MCG/2ML injection Inject 1 mL (2 mcg total) into the vein every Monday, Wednesday, and Friday with hemodialysis.  . ferric gluconate 250 mg in sodium chloride 0.9 % 100 mL Inject 250 mg into the vein every Monday, Wednesday, and Friday with hemodialysis.  . hydrALAZINE (APRESOLINE) 25 MG tablet Take 25 mg by mouth every 12 (twelve) hours.  . insulin detemir (LEVEMIR) 100 UNIT/ML injection Inject 0.2 mLs (20 Units total) into the skin 2 (two) times daily.  [name] levETIRAcetam (KEPPRA) 500 MG tablet Take 2 tablets (1,000 mg total) by mouth daily. Then take extra 500 mg (1 tablet) on MWF after HD  . metoprolol tartrate (LOPRESSOR) 25 MG tablet Take 1 tablet (25 mg total) by mouth 2 (two) times daily.  . Nutritional Supplements (FEEDING SUPPLEMENT, NEPRO CARB STEADY,) LIQD Take 237 mLs by mouth 2 (two) times daily between meals.  . sertraline (ZOLOFT) 25 MG tablet Take 25 mg by mouth at bedtime.  . Warfarin Sodium (COUMADIN PO) Take 4.5 mg by mouth daily.  . [DISCONTINUED] amitriptyline (ELAVIL) 25 MG tablet TAKE ONE TABLET BY MOUTH AT BEDTIME FOR  NEUROPATHY  . [DISCONTINUED] warfarin (COUMADIN) 1 MG tablet Take 1 tablet (1 mg total) by mouth daily. As directed to keep INR between 2.0 and 2.5   No facility-administered encounter medications on file as of 10/31/2017.     Review of Systems  GENERAL: No change in appetite, no fatigue, no weight changes, no fever, chills or weakness MOUTH and THROAT: Denies oral discomfort, gingival pain or bleeding, pain from teeth or hoarseness   RESPIRATORY: no cough, SOB, DOE, wheezing, hemoptysis CARDIAC: No chest pain, edema or palpitations GI:  +constipation GU: Denies dysuria, frequency, hematuria, incontinence, or  discharge PSYCHIATRIC: Denies feelings of depression or anxiety. No report of hallucinations, insomnia, paranoia, or agitation   Immunization History  Administered Date(s) Administered  . Influenza,inj,Quad PF,6+ Mos 07/13/2015  . Influenza-Unspecified 07/15/2013  . Pneumococcal-Unspecified 07/15/2013   Pertinent  Health Maintenance Due  Topic Date Due  . FOOT EXAM  09/18/1958  . URINE MICROALBUMIN  09/18/1958  . PNA vac Low Risk Adult (1 of 2 - PCV13) 07/15/2014  . INFLUENZA VACCINE  05/15/2017  . OPHTHALMOLOGY EXAM  08/07/2017  . LIPID PANEL  10/28/2017  . HEMOGLOBIN A1C  12/09/2017  . COLONOSCOPY  06/01/2018   Fall Risk  10/16/2017 07/18/2017 11/12/2016 02/07/2016 09/15/2015  Falls in the past year? Yes Yes Yes Yes Yes  Number falls in past yr: 2 or more 2 or more 2 or more 2 or more 2 or more  Injury with Fall? No - No Yes No  Risk Factor Category  - - - High Fall Risk High Fall Risk  Risk for fall due to : History of fall(s);Impaired balance/gait;Mental status change - - - Impaired balance/gait  Risk for fall due to: Comment - - - - -  Follow up Education provided Falls evaluation completed - Falls evaluation completed -      Vitals:   10/31/17 1012  BP: 127/70  Pulse: 75  Resp: 18  Temp: 98.1 F (36.7 C)  TempSrc: Oral  SpO2: 97%  Weight: 203 lb 11.4 oz (92.4 kg)  Height: 5\' 9"  (1.753 m)   Body mass index is 30.08 kg/m.  Physical Exam  GENERAL APPEARANCE: Well nourished. In no acute distress. Obese SKIN:  Skin is warm and dry.  MOUTH and THROAT: Lips are without lesions. Oral mucosa is moist and without lesions.  RESPIRATORY: Breathing is even & unlabored, BS CTAB CARDIAC: RRR, no murmur,no extra heart sounds, no edema, Left upper arm AV fistula + bruit/thrill GI: Abdomen soft, normal BS, no masses, no tenderness EXTREMITIES:  Able to move X 4 extremities NEUROLOGICAL: Slurred speech PSYCHIATRIC: Alert to self, disoriented to time and place. Affect and  behavior are appropriate   Labs reviewed: Recent Labs    09/19/17 1945 09/20/17 0400 09/20/17 1628  10/02/17 0718 10/05/17 0613 10/05/17 0615 10/07/17 1100 10/08/17 0247  NA  --  134*  --    < > 133* 134*  --  136 134*  K  --  4.0  --    < > 4.3 4.2  --  4.5 4.3  CL  --  93*  --    < > 93* 95*  --  97* 95*  CO2  --  23  --    < > 25 27  --  28 26  GLUCOSE  --  165*  --    < > 122* 83  --  96 193*  BUN  --  20  --    < > 77* 57*  --  38* 23*  CREATININE  --  5.19*  --    < > 7.65* 8.27*  --  8.16* 5.85*  CALCIUM  --  7.7*  --    < > 8.5* 8.4* 8.4* 8.3* 8.3*  MG 2.0 2.0 2.0  --   --   --   --   --   --   PHOS 3.6 4.1 2.6  --  5.8* 5.0*  --   --   --    < > = values in this interval not displayed.   Recent Labs    09/21/17 0320 09/23/17 0226 10/02/17 0718 10/05/17 0613 10/08/17 0247  AST 25 19  --   --  16  ALT 20 17  --   --  17  ALKPHOS 77 82  --   --  71  BILITOT 0.9 0.3  --   --  0.6  PROT 5.2* 5.5*  --   --  5.7*  ALBUMIN 2.2* 2.2* 2.2* 2.1* 2.5*   Recent Labs    12/15/16 1124 09/08/17 0046  10/02/17 0730 10/05/17 0615 10/08/17 0247  WBC 9.2 11.7*   < >  10.6* 10.0 9.2  NEUTROABS 5.8 9.6*  --   --   --  6.5  HGB 11.6* 11.6*   < > 8.3* 8.6* 10.0*  HCT 34.6* 34.8*   < > 24.8* 25.2* 29.7*  MCV 90.6 89.7   < > 82.7 80.8 82.5  PLT 166 145*   < > 413* 401* 477*   < > = values in this interval not displayed.   Lab Results  Component Value Date   TSH 1.064 07/12/2015   Lab Results  Component Value Date   HGBA1C 9.1 (H) 09/08/2017   Lab Results  Component Value Date   CHOL 116 10/28/2016   HDL 48 10/28/2016   LDLCALC 50 10/28/2016   TRIG 92 10/28/2016   CHOLHDL 2.4 10/28/2016    Significant Diagnostic Results in last 30 days:  Dg Chest 2 View  Result Date: 10/08/2017 CLINICAL DATA:  Acute onset of altered mental status. EXAM: CHEST  2 VIEW COMPARISON:  Chest radiograph performed 10/01/2017 FINDINGS: There is persistent left basilar and left midlung  airspace opacity, concerning for pneumonia. A small to moderate left-sided pleural effusion is again noted. The right lung appears relatively clear. No pneumothorax is seen. The cardiomediastinal silhouette is mildly enlarged. The patient is status post median sternotomy. A valve replacement is noted. No acute osseous abnormalities are seen. IMPRESSION: 1. Persistent left-sided pneumonia, with small to moderate left-sided pleural effusion. 2. Mild cardiomegaly. Electronically Signed   By: Garald Balding M.D.   On: 10/08/2017 03:13   Ct Head Wo Contrast  Result Date: 10/08/2017 CLINICAL DATA:  Altered mental status EXAM: CT HEAD WITHOUT CONTRAST TECHNIQUE: Contiguous axial images were obtained from the base of the skull through the vertex without intravenous contrast. COMPARISON:  Head CT 09/19/2017 FINDINGS: Brain: No mass lesion, intraparenchymal hemorrhage or extra-axial collection. No evidence of acute cortical infarct. Basal ganglia of parenchymal calcification and scattered parenchymal vascular calcifications are unchanged. Old bilateral cerebellar infarcts. There is periventricular hypoattenuation compatible with chronic microvascular disease. Vascular: No hyperdense vessel or unexpected calcification. Skull: Normal visualized skull base, calvarium and extracranial soft tissues. Sinuses/Orbits: No sinus fluid levels or advanced mucosal thickening. No mastoid effusion. Normal orbits. IMPRESSION: 1. Unchanged appearance of the brain with old bilateral cerebellar infarcts, chronic microvascular ischemia and multifocal vascular calcification. 2. No acute intracranial abnormality. Electronically Signed   By: Ulyses Jarred M.D.   On: 10/08/2017 03:28    Assessment/Plan  1. Generalized weakness - continue rehabilitation with PT and OT, for therapeutic strengthening exercises, fall precautions   2. Coronary artery disease involving native coronary artery of native heart without angina pectoris - S/P CABG  X 4 on 10/04/17, stable, continue atorvastatin 40 mg 1 tab daily, ASA 81 mg daily   3. Paroxysmal atrial flutter (HCC) - rate-controlled, continue amiodarone 200 mg 1 tab twice a day, metoprolol tartrate 50 mg give 1/2 tab = 25 mg twice a day   4. End-stage renal disease on hemodialysis ([place]) - on hemodialysis 3X/week, continue Calcium acetate 667 mg give 3 capsules = 2001 mg TID, Sensipar 30 mg give 4 tabs = 120 mg daily, and Doxercalciferol 4 mcg/2 ml inject 1 ml = 2 mg Q MWF at hemodialysis   5. Vascular dementia with behavior disturbance - continue supportive care, fall precautions   6. History of CVA with residual deficit - stable, continue Coumadin 3 mg give 1 1/2 tab = 4.5 mg daily, continue hydralazine 25 mg 1 tab every 12 hours,  Metoprolol tartrate  50 mg give 1/2 tab = 25 mg BID, and Atorvastatin 40 mg daily   7. Seizure, late effect of stroke ([place]) - no seizures recently, continue Keppra 1000 mg one tablet daily and 500 mg every MWF after dialysis   8. Anemia due to chronic kidney disease, on chronic dialysis ([place]) - continue darbepoetin 100 G/0.5 ML INJECTION ON MONDAYS AT hemodialysis and ferric gluconate 250 mg injection every Mondays, Wednesdays and Fridays at hemodialysis, check CBC Lab Results  Component Value Date   HGB 10.0 (L) 10/08/2017    9. Type 2 diabetes mellitus with chronic kidney disease on chronic dialysis, with long-term current use of insulin (HCC) - CBGs are well-controlled, continue Levemir 100 units/ml inject 20 units BID Lab Results  Component Value Date   HGBA1C 9.1 (H) 09/08/2017     10. Essential hypertension - well-controlled, continue hydralazine 25 mg 1 tab every 12 hours and Metoprolol tartrate  50 mg give 1/2 tab = 25 mg BID   11. Depression, recurrent ([place]) - mood is stable, continue sertraline 25 mg 1 tab daily at bedtime   12. Constipation -  Start Senna-S 2 tabs PO BID     Family/ staff Communication:  Discussed plan of care  with patient and charge nurse.  Labs/tests ordered:  CBC  Goals of care:   Short-term rehabilitation   Durenda Age, NP [facility] [phone] (Monday-Friday 8:00 a.m. - 5:00 p.m.) [phone] (after hours)

## 2017-11-01 DIAGNOSIS — N2581 Secondary hyperparathyroidism of renal origin: Secondary | ICD-10-CM | POA: Diagnosis not present

## 2017-11-01 DIAGNOSIS — D631 Anemia in chronic kidney disease: Secondary | ICD-10-CM | POA: Diagnosis not present

## 2017-11-01 DIAGNOSIS — D509 Iron deficiency anemia, unspecified: Secondary | ICD-10-CM | POA: Diagnosis not present

## 2017-11-01 DIAGNOSIS — E119 Type 2 diabetes mellitus without complications: Secondary | ICD-10-CM | POA: Diagnosis not present

## 2017-11-01 DIAGNOSIS — N186 End stage renal disease: Secondary | ICD-10-CM | POA: Diagnosis not present

## 2017-11-04 DIAGNOSIS — E119 Type 2 diabetes mellitus without complications: Secondary | ICD-10-CM | POA: Diagnosis not present

## 2017-11-04 DIAGNOSIS — D509 Iron deficiency anemia, unspecified: Secondary | ICD-10-CM | POA: Diagnosis not present

## 2017-11-04 DIAGNOSIS — N186 End stage renal disease: Secondary | ICD-10-CM | POA: Diagnosis not present

## 2017-11-04 DIAGNOSIS — D631 Anemia in chronic kidney disease: Secondary | ICD-10-CM | POA: Diagnosis not present

## 2017-11-04 DIAGNOSIS — N2581 Secondary hyperparathyroidism of renal origin: Secondary | ICD-10-CM | POA: Diagnosis not present

## 2017-11-04 LAB — POCT INR: INR: 2.6 — AB (ref 0.9–1.1)

## 2017-11-04 LAB — PROTIME-INR: Protime: 27.3 — AB (ref 10.0–13.8)

## 2017-11-05 ENCOUNTER — Ambulatory Visit: Payer: Medicare Other | Admitting: Thoracic Surgery (Cardiothoracic Vascular Surgery)

## 2017-11-06 DIAGNOSIS — N186 End stage renal disease: Secondary | ICD-10-CM | POA: Diagnosis not present

## 2017-11-06 DIAGNOSIS — E119 Type 2 diabetes mellitus without complications: Secondary | ICD-10-CM | POA: Diagnosis not present

## 2017-11-06 DIAGNOSIS — N2581 Secondary hyperparathyroidism of renal origin: Secondary | ICD-10-CM | POA: Diagnosis not present

## 2017-11-06 DIAGNOSIS — D509 Iron deficiency anemia, unspecified: Secondary | ICD-10-CM | POA: Diagnosis not present

## 2017-11-06 DIAGNOSIS — D631 Anemia in chronic kidney disease: Secondary | ICD-10-CM | POA: Diagnosis not present

## 2017-11-06 NOTE — Progress Notes (Signed)
Cardiology Office Note   Date:  11/07/2017   ID:  ZACCARY CREECH, DOB 1946-10-11, MRN [id number]  PCP:  Patient, [name]  Cardiologist:  Dr. Irish Lack     Chief Complaint  Patient presents with  . Hospitalization Follow-up    post MI and CABG      History of Present Illness: Ernest [name] is a 72 y.o. male who presents for post hospitalization- admitted 09/08/18 with increasing DOE.  No chest pain, troponin 6.48 NSTEMI--underwent cath with significant coronary calcification with high-grade 65% ostial and 90-95% distal left main stenosis, calcification of the proximal LAD with 20-25% narrowing, large ramus intermediate vessel; total occlusion of the left circumflex at the ostium.  Dominant RCA with 60% ostial proximal narrowing in an RV marginal branch and collateralization to the distal circumflex.  Also severe MR  He has a prior hx of CAD treated medically, chronic systolic HF, COPD, ESRD on HD MWF, HTN, HLD, DM II and h/o CVA   He was seen by [facility] and had CABG 09/14/17  PROCEDURES:  Median sternotomy, extracorporeal circulation, Coronary artery bypass grafting x4      Left internal mammary artery to left anterior descending,      Sequential saphenous vein graft to ramus intermedius and obtuse marginal,      Saphenous vein graft to acute marginal, Endoscopic vein harvest from right thigh, Mitral valve repair with Edwards Physio II 32-mm annuloplasty ring (model #[id number], serial [id number]).  The patient had confusion post operatively.  Repeat head CT scan was obtained and was stable from his baseline scans.  It was felt he likely had toxic metabolic encephalopathy.  Sedating medications were stopped and he was restarted on home Elavil.    He developed atrial flutter and was started on Amiodarone drip. He did not covert and was started on Heparin drip due to underlying MV Repair. Changed to coumadin and pt did convert to SR  Discharged to SNF [facility]    Today he has no chest pain and no SOB.  He is in a flutter.  Rate controlled. He is on amiodarone 200 mg BID.  He is on coumadin and was just therapeutic 1/21/9 with INR 2.6.  Previously 1.5 and 1.4.    He is undergoing PT and would like to return home soon.  He has dialysis MWF.  His appetite is good, + constipation, no colds or fevers, though cough at times.    He did revisit the hospital once for falling out of bed but no injuries. He stated he just rolled out.  His slurred speech continues.   Past Medical History:  Diagnosis Date  . Abnormal stress test    s/p cath November 2013 with modest disease involving the ostial left main, proximal LAD, proximal RCA - do not appear to be hemodynamically signficant; mild LV dysfunction  . Bacteremia   . Chronic systolic CHF (congestive heart failure) ([place])   . COPD (chronic obstructive pulmonary disease) ([place])   . Coronary artery disease    per cath report 2013  . Ejection fraction < 50%   . Erectile dysfunction    penile implant  . ESRD (end stage renal disease) ([place])    Started HD in [place] in 2009, ESRD was due to DM. Moved to [facility] in Dec 2009 and now gets dialysis at [facility] on a MWF schedule.     . Gout    Hx: of  . Hepatitis C   .  Hyperlipidemia   . Hypertension   . Obesity   . Retinopathy   . Shortness of breath    Hx: of with exertion  . Stroke ([place])   . Type II or unspecified type diabetes mellitus without mention of complication, not stated as uncontrolled    adult onset    Past Surgical History:  Procedure Laterality Date  . A/V FISTULAGRAM N/A 10/04/2017   Procedure: A/V FISTULAGRAM;  Surgeon: Elam Dutch, MD;  Location: [place] [facility];  Service: Cardiovascular;  Laterality: N/A;  . AV FISTULA PLACEMENT Left 01/13/2013   Procedure: INSERTION OF ARTERIOVENOUS (AV) GORE-TEX GRAFT ARM;  Surgeon: Angelia Mould, MD;  Location: [place];  Service: Vascular;  Laterality: Left;  . AV FISTULA PLACEMENT Left  05/05/2013   Procedure: INSERTION OF LEFT UPPER ARM  ARTERIOVENOUS GORTEX GRAFT;  Surgeon: Angelia Mould, MD;  Location: [place];  Service: Vascular;  Laterality: Left;  . [place] REMOVAL Left 03/26/2013   Procedure: REMOVAL OF  NON INCORPORATED ARTERIOVENOUS GORETEX GRAFT ([place]) left arm * repair of  left brachial artery with vein patch angioplasty.;  Surgeon: Angelia Mould, MD;  Location: [place];  Service: Vascular;  Laterality: Left;  . CARDIAC CATHETERIZATION  August 26, 2012  . CATARACT EXTRACTION W/ INTRAOCULAR LENS  IMPLANT, BILATERAL    . COLONOSCOPY    . CORONARY ARTERY BYPASS GRAFT N/A 09/13/2017   Procedure: CORONARY ARTERY BYPASS GRAFTING (CABG) times four LIMA  to LAD, SVG sequentially to OM and RAMUS Intermediate and SVG to Acute Marginal;  Surgeon: Melrose Nakayama, MD;  Location: [place];  Service: Open Heart Surgery;  Laterality: N/A;  . EMBOLECTOMY Right 08/27/2013   Procedure: EMBOLECTOMY;  Surgeon: Mal Misty, MD;  Location: [place];  Service: Vascular;  Laterality: Right;  Thrombectomy of Radial and ulnar artery.  [name] EYE SURGERY     laser.  and surgery for DM  . fistula     RUE and wrist  . INSERTION OF DIALYSIS CATHETER Right 01/01/2013   Procedure: INSERTION OF DIALYSIS CATHETER right  internal jugular;  Surgeon: Serafina Mitchell, MD;  Location: [place];  Service: Vascular;  Laterality: Right;  . INSERTION OF DIALYSIS CATHETER N/A 03/26/2013   Procedure: INSERTION OF DIALYSIS CATHETER Left internal jugular vein;  Surgeon: Angelia Mould, MD;  Location: [place];  Service: Vascular;  Laterality: N/A;  . LEFT HEART CATH AND CORONARY ANGIOGRAPHY N/A 09/09/2017   Procedure: LEFT HEART CATH AND CORONARY ANGIOGRAPHY;  Surgeon: Troy Sine, MD;  Location: [place] [facility];  Service: Cardiovascular;  Laterality: N/A;  . LIGATION OF ARTERIOVENOUS  FISTULA Right 12/30/2013   Procedure: REMOVAL OF SEGMENT OF GORTEX GRAFT AND FISTULA  AND REPAIR OF BRACHIAL ARTERY;   Surgeon: Mal Misty, MD;  Location: [place];  Service: Vascular;  Laterality: Right;  . MITRAL VALVE REPAIR N/A 09/13/2017   Procedure: MITRAL VALVE REPAIR (MVR);  Surgeon: Melrose Nakayama, MD;  Location: [place];  Service: Open Heart Surgery;  Laterality: N/A;  . PENILE PROSTHESIS IMPLANT     1997.. no card  . PERIPHERAL VASCULAR BALLOON ANGIOPLASTY Left 10/04/2017   Procedure: PERIPHERAL VASCULAR BALLOON ANGIOPLASTY;  Surgeon: Elam Dutch, MD;  Location: [place] [facility];  Service: Cardiovascular;  Laterality: Left;  AVF  . TEE WITHOUT CARDIOVERSION N/A 06/11/2013   Procedure: TRANSESOPHAGEAL ECHOCARDIOGRAM (TEE);  Surgeon: Larey Dresser, MD;  Location: [place];  Service: Cardiovascular;  Laterality: N/A;  .  TEE WITHOUT CARDIOVERSION N/A 09/13/2017   Procedure: TRANSESOPHAGEAL ECHOCARDIOGRAM (TEE);  Surgeon: Melrose Nakayama, MD;  Location: [place];  Service: Open Heart Surgery;  Laterality: N/A;  . THROMBECTOMY W/ EMBOLECTOMY Left 03/26/2013   Procedure: Attempted thrombectomy of left arm arteriovenous goretex graft.;  Surgeon: Angelia Mould, MD;  Location: [place];  Service: Vascular;  Laterality: Left;  [name] VENOGRAM N/A 04/07/2013   Procedure: VENOGRAM;  Surgeon: Serafina Mitchell, MD;  Location: [facility] C[facility];  Service: Cardiovascular;  Laterality: N/A;     Current Outpatient Medications  Medication Sig Dispense Refill  . amiodarone (PACERONE) 200 MG tablet Take 1 tablet (200 mg total) by mouth 2 (two) times daily.    [name] aspirin EC 81 MG tablet Take 1 tablet (81 mg total) by mouth daily. 30 tablet 11  . atorvastatin (LIPITOR) 40 MG tablet Take 40 mg by mouth daily.     . B Complex-C-Folic Acid (NEPHRO-VITE PO) Take 1 tablet by mouth daily.    . calcium acetate (PHOSLO) 667 MG capsule Take 2,001 mg by mouth 3 (three) times daily with meals.    . cinacalcet (SENSIPAR) 30 MG tablet Take 4 tablets (120 mg total) by mouth daily with supper. 60 tablet   . hydrALAZINE  (APRESOLINE) 25 MG tablet Take 25 mg by mouth every 12 (twelve) hours.    . insulin detemir (LEVEMIR) 100 UNIT/ML injection Inject 0.2 mLs (20 Units total) into the skin 2 (two) times daily. 10 mL 11  . levETIRAcetam (KEPPRA) 500 MG tablet Take 2 tablets (1,000 mg total) by mouth daily. Then take extra 500 mg (1 tablet) on MWF after HD 72 tablet 5  . metoprolol tartrate (LOPRESSOR) 25 MG tablet Take 1 tablet (25 mg total) by mouth 2 (two) times daily.    . Nutritional Supplements (FEEDING SUPPLEMENT, NEPRO CARB STEADY,) LIQD Take 237 mLs by mouth 2 (two) times daily between meals.  0  . sertraline (ZOLOFT) 25 MG tablet Take 25 mg by mouth at bedtime.    . Darbepoetin Alfa (ARANESP) 100 MCG/0.5ML SOSY injection Inject 0.5 mLs (100 mcg total) into the vein every Monday with hemodialysis. 4.2 mL 5  . doxercalciferol (HECTOROL) 4 MCG/2ML injection Inject 1 mL (2 mcg total) into the vein every Monday, Wednesday, and Friday with hemodialysis. 2 mL   . ferric gluconate 250 mg in sodium chloride 0.9 % 100 mL Inject 250 mg into the vein every Monday, Wednesday, and Friday with hemodialysis.     No current facility-administered medications for this visit.     Allergies:   Patient has no known allergies.    Social History:  The patient  reports that he quit smoking about 44 years ago. He quit after 7.00 years of use. he has never used smokeless tobacco. He reports that he does not drink alcohol or use drugs.   Family History:  The patient's family history includes Diabetes in his brother, sister, and son; Heart disease in his father.    ROS:  General:no colds or fevers, + weight increase with winter clothes in place Skin:no rashes or ulcers HEENT:no blurred vision, no congestion CV:see HPI PUL:see HPI GI:no diarrhea + constipation no melena, no indigestion GU:no hematuria, no dysuria MS:no joint pain, no claudication Neuro:no syncope, no lightheadedness Endo:+ diabetes, no thyroid disease  Wt  Readings from Last 3 Encounters:  11/07/17 217 lb (98.4 kg)  10/31/17 203 lb 11.4 oz (92.4 kg)  10/10/17 203 lb 11.4 oz (92.4 kg)  PHYSICAL EXAM: VS:  BP (!) 106/54   Pulse 93   Wt 217 lb (98.4 kg)   BMI 32.05 kg/m  , BMI Body mass index is 32.05 kg/m. General:Pleasant affect, NAD Skin:Warm and dry, brisk capillary refill HEENT:normocephalic, sclera clear, mucus membranes moist, speech slurred difficult to understand at times Neck:supple, no JVD, no bruits  Heart:irreg irreg with soft  murmur, no gallup, rub or click, incision healing well. Lungs:clear without rales, rhonchi, or wheezes XLK:GMWN, non tender, + BS, do not palpate liver spleen or masses Ext:no lower ext edema, 2+ pedal pulses, 2+ radial pulses Neuro:alert and oriented X 3, MAE, follows commands, + facial symmetry    EKG:  EKG is ordered today. The ekg ordered today demonstrates Atrial flutter, LAD non specific T wave abnormality     Recent Labs: 09/08/2017: B Natriuretic Peptide 4,125.9 09/20/2017: Magnesium 2.0 10/08/2017: ALT 17; BUN 23; Creatinine, Ser 5.85; Hemoglobin 10.0; Platelets 477; Potassium 4.3; Sodium 134    Lipid Panel    Component Value Date/Time   CHOL 116 10/28/2016 0442   TRIG 92 10/28/2016 0442   HDL 48 10/28/2016 0442   CHOLHDL 2.4 10/28/2016 0442   VLDL 18 10/28/2016 0442   LDLCALC 50 10/28/2016 0442       Other studies Reviewed: Additional studies/ records that were reviewed today include: .  Echo 09/08/17 Study Conclusions  - Left ventricle: Inferobasal and posterior lateral hypokinesis EF   similar to echo from 2016. The cavity size was normal. Wall   thickness was normal. Systolic function was mildly reduced. The   estimated ejection fraction was in the range of 45% to 50%. - Mitral valve: Calcified annulus. Mildly thickened leaflets .   There was mild regurgitation. - Left atrium: The atrium was moderately dilated. - Pulmonary arteries: PA peak pressure: 50 mm Hg  (S).   Cardiac cath 09/09/17 Conclusion     Ost LM lesion is 65% stenosed.  Mid LM lesion is 90% stenosed.  Ost Cx to Prox Cx lesion is 100% stenosed.  Ost LAD lesion is 20% stenosed.  Acute Mrg lesion is 60% stenosed.   Significant coronary calcification with high-grade 65% ostial and 90-95% distal left main stenosis, calcification of the proximal LAD with 20-25% narrowing, large ramus intermediate vessel; total occlusion of the left circumflex at the ostium.  Dominant RCA with 60% ostial proximal narrowing in an RV marginal branch and collateralization to the distal circumflex.  LVEDP 14 mm Hg.  RECOMMENDATION: Surgical consultation for CABG revascularization.    Dopplers Final Interpretation: Right Carotid: There is evidence in the right ICA of a 1-39% stenosis.  Left Carotid: There is evidence in the left ICA of a 1-39% stenosis. Vertebrals: Both vertebral arteries were patent with antegrade flow.   Right Upper Extremity: Obliterates with radial compression, normal with ulnar compression. Left Upper Extremity: Normal with radial and ulnar compression.  TEE in OR  Conclusions   Result status: Final result   Septum: No Patent Foramen Ovale present.  Left atrium: Patent foramen ovale not present.  Left atrium: Cavity is mildly to moderately dilated. Systolic blunting of pulmonary venous inflow. Spontaneous echo contrast.  Aortic valve: The valve is trileaflet. Mild valve calcification present. Mildly decreased leaflet separation. No stenosis. No regurgitation.  Mitral valve: Dilated mitral annulus. No leaflet thickening and calcification present. Moderate to severe regurgitation.  Right ventricle: Normal wall thickness. Cavity is mildly to moderately dilated. Moderately reduced systolic function. RV systolic function is decreased moderately Abnormal tricuspid annular  plane systolic excursion (TAPSE) < 1.7cm. No thrombus present. No mass present. Catheter  present in the ventricle.  Right atrium: Cavity is mildly dilated. Interatrial septum bows to the left, consistent with elevated right atrial pressure.  Tricuspid valve: Valve has a dilated annulus. Mild to moderate regurgitation.     OR note see above  10/04/17  ASSESSMENT AND PLAN:  1.  NSTEMI 09/08/17 with cath and CABG   2. CAD with CABG 10/04/17 doing well - slowly regaining energy. Is in SNF   3. MR with MV repair.  Recheck echo after DCCV.  4.  A flutter with CHA2DS2VASC of 7 on coumadin just now therapeutic, was in SR at discharge but now a flutter.  Rate controlled.  Plan for therapeutic INR for 3 weeks and follow up in 3 weeks with wife in attendance to review DCCV.  SR may give more energy.  Continue amiodarone 200 mg BID and BB.  We were unable to check labs today, unable to hit vein we sent order to have CMP, TSH drawn at dialysis  5.  ESRD on HD MWF, graft revised with hospitalization  6.  Slurred speech chronic due to prior CVAs baseline   7.  HLD on statin  8.  DM on insulin per PCP, and MD at SNF  9.  HTN controlled   Current medicines are reviewed with the patient today.  The patient Has no concerns regarding medicines.  The following changes have been made:  See above Labs/ tests ordered today include:see above  Disposition:   FU:  see above  Signed, Cecilie Kicks, NP  11/07/2017 11:54 AM    [address], [place], [place], [place] Phone: [phone]; Fax: [phone]

## 2017-11-07 ENCOUNTER — Encounter: Payer: Self-pay | Admitting: Cardiology

## 2017-11-07 ENCOUNTER — Encounter (INDEPENDENT_AMBULATORY_CARE_PROVIDER_SITE_OTHER): Payer: Self-pay

## 2017-11-07 ENCOUNTER — Ambulatory Visit (INDEPENDENT_AMBULATORY_CARE_PROVIDER_SITE_OTHER): Payer: Medicare Other | Admitting: Cardiology

## 2017-11-07 VITALS — BP 106/54 | HR 93 | Wt 217.0 lb

## 2017-11-07 DIAGNOSIS — E782 Mixed hyperlipidemia: Secondary | ICD-10-CM | POA: Diagnosis not present

## 2017-11-07 DIAGNOSIS — Z9889 Other specified postprocedural states: Secondary | ICD-10-CM

## 2017-11-07 DIAGNOSIS — N186 End stage renal disease: Secondary | ICD-10-CM

## 2017-11-07 DIAGNOSIS — Z951 Presence of aortocoronary bypass graft: Secondary | ICD-10-CM | POA: Diagnosis not present

## 2017-11-07 DIAGNOSIS — I214 Non-ST elevation (NSTEMI) myocardial infarction: Secondary | ICD-10-CM | POA: Diagnosis not present

## 2017-11-07 DIAGNOSIS — I2510 Atherosclerotic heart disease of native coronary artery without angina pectoris: Secondary | ICD-10-CM

## 2017-11-07 DIAGNOSIS — I10 Essential (primary) hypertension: Secondary | ICD-10-CM | POA: Diagnosis not present

## 2017-11-07 DIAGNOSIS — Z794 Long term (current) use of insulin: Secondary | ICD-10-CM

## 2017-11-07 DIAGNOSIS — E118 Type 2 diabetes mellitus with unspecified complications: Secondary | ICD-10-CM

## 2017-11-07 DIAGNOSIS — I483 Typical atrial flutter: Secondary | ICD-10-CM | POA: Diagnosis not present

## 2017-11-07 DIAGNOSIS — Z992 Dependence on renal dialysis: Secondary | ICD-10-CM | POA: Diagnosis not present

## 2017-11-07 LAB — POCT INR: INR: 3.4 — AB (ref 0.9–1.1)

## 2017-11-07 LAB — PROTIME-INR: PROTIME: 33.7 — AB (ref 10.0–13.8)

## 2017-11-07 NOTE — Patient Instructions (Addendum)
Medication Instructions:  Your physician recommends that you continue on your current medications as directed. Please refer to the Current Medication list given to you today.   Labwork: Please have lab work done at dialysis appointment:  Liver, BMET, CBC, TSH Report results to our office at [phone] (fax) or [phone] (phone)   Testing/Procedures: None Ordered    Follow-Up: Your physician recommends that you return for a follow-up appointment in: 3 weeks with APP to plan for cardioversion with Cecilie Kicks, NP on Thursday 2/14 at 1:30 pm **Please have your wife come with you to the next appointment   If you need a refill on your cardiac medications before your next appointment, please call your pharmacy.   Thank you for choosing [facility]! Christen Bame, RN [phone]

## 2017-11-08 ENCOUNTER — Other Ambulatory Visit: Payer: Self-pay | Admitting: Thoracic Surgery (Cardiothoracic Vascular Surgery)

## 2017-11-08 ENCOUNTER — Encounter: Payer: Self-pay | Admitting: Adult Health

## 2017-11-08 DIAGNOSIS — D509 Iron deficiency anemia, unspecified: Secondary | ICD-10-CM | POA: Diagnosis not present

## 2017-11-08 DIAGNOSIS — E119 Type 2 diabetes mellitus without complications: Secondary | ICD-10-CM | POA: Diagnosis not present

## 2017-11-08 DIAGNOSIS — Z951 Presence of aortocoronary bypass graft: Secondary | ICD-10-CM

## 2017-11-08 DIAGNOSIS — D631 Anemia in chronic kidney disease: Secondary | ICD-10-CM | POA: Diagnosis not present

## 2017-11-08 DIAGNOSIS — N2581 Secondary hyperparathyroidism of renal origin: Secondary | ICD-10-CM | POA: Diagnosis not present

## 2017-11-08 DIAGNOSIS — N186 End stage renal disease: Secondary | ICD-10-CM | POA: Diagnosis not present

## 2017-11-08 LAB — CBC AND DIFFERENTIAL
HEMATOCRIT: 36 — AB (ref 41–53)
HEMOGLOBIN: 11.8 — AB (ref 13.5–17.5)
Neutrophils Absolute: 4
Platelets: 244 (ref 150–399)
WBC: 7.3

## 2017-11-08 LAB — PROTIME-INR: PROTIME: 29.9 — AB (ref 10.0–13.8)

## 2017-11-08 LAB — BASIC METABOLIC PANEL
BUN: 14 (ref 4–21)
CREATININE: 3.7 — AB (ref 0.6–1.3)
Glucose: 104
POTASSIUM: 3.6 (ref 3.4–5.3)
Sodium: 138 (ref 137–147)

## 2017-11-08 LAB — HEPATIC FUNCTION PANEL
ALT: 14 (ref 10–40)
AST: 17 (ref 14–40)
Alkaline Phosphatase: 89 (ref 25–125)
Bilirubin, Total: 0.2

## 2017-11-08 LAB — POCT INR: INR: 3 — AB (ref 0.9–1.1)

## 2017-11-08 NOTE — Progress Notes (Signed)
This encounter was created in error - please disregard.

## 2017-11-09 LAB — PROTIME-INR: Protime: 27.7 — AB (ref 10.0–13.8)

## 2017-11-09 LAB — POCT INR: INR: 2.7 — AB (ref 0.9–1.1)

## 2017-11-11 ENCOUNTER — Ambulatory Visit (INDEPENDENT_AMBULATORY_CARE_PROVIDER_SITE_OTHER): Payer: Self-pay | Admitting: Surgical

## 2017-11-11 ENCOUNTER — Ambulatory Visit
Admission: RE | Admit: 2017-11-11 | Discharge: 2017-11-11 | Disposition: A | Discharge status: Home / Self Care | Payer: Medicare Other | Source: Ambulatory Visit | Attending: Thoracic Surgery (Cardiothoracic Vascular Surgery) | Admitting: Thoracic Surgery (Cardiothoracic Vascular Surgery)

## 2017-11-11 ENCOUNTER — Other Ambulatory Visit: Payer: Self-pay

## 2017-11-11 ENCOUNTER — Other Ambulatory Visit: Payer: Self-pay | Admitting: *Deleted

## 2017-11-11 VITALS — BP 98/59 | Resp 18 | Ht 69.0 in | Wt 217.0 lb

## 2017-11-11 DIAGNOSIS — D631 Anemia in chronic kidney disease: Secondary | ICD-10-CM | POA: Diagnosis not present

## 2017-11-11 DIAGNOSIS — E119 Type 2 diabetes mellitus without complications: Secondary | ICD-10-CM | POA: Diagnosis not present

## 2017-11-11 DIAGNOSIS — Z951 Presence of aortocoronary bypass graft: Secondary | ICD-10-CM

## 2017-11-11 DIAGNOSIS — N2581 Secondary hyperparathyroidism of renal origin: Secondary | ICD-10-CM | POA: Diagnosis not present

## 2017-11-11 DIAGNOSIS — N186 End stage renal disease: Secondary | ICD-10-CM | POA: Diagnosis not present

## 2017-11-11 DIAGNOSIS — D509 Iron deficiency anemia, unspecified: Secondary | ICD-10-CM | POA: Diagnosis not present

## 2017-11-11 DIAGNOSIS — J90 Pleural effusion, not elsewhere classified: Secondary | ICD-10-CM | POA: Diagnosis not present

## 2017-11-11 LAB — PROTIME-INR: Protime: 25.7 — AB (ref 10.0–13.8)

## 2017-11-11 LAB — POCT INR: INR: 2.4 — AB (ref 0.9–1.1)

## 2017-11-11 NOTE — Progress Notes (Signed)
[address]       [place] [address]             [phone]      Franki H Velaquez [place] Medical Record #[id number] Date of Birth: 1945-11-02  Referring: Troy Sine, MD Primary Care: Patient, [name] Primary Cardiologist: No primary care provider on file.   Chief Complaint:   POST OP FOLLOW UP  DATE OF PROCEDURE:  09/13/2017 DATE OF DISCHARGE:                              OPERATIVE REPORT   PREOPERATIVE DIAGNOSIS:  Severe left main coronary artery disease, status post non-ST elevation myocardial infarction.  POSTOPERATIVE DIAGNOSES: 1. Severe left main coronary artery disease, status post non-ST     elevation myocardial infarction. 2. Moderate to severe mitral regurgitation.  PROCEDURES:  Median sternotomy, extracorporeal circulation, Coronary artery bypass grafting x4      Left internal mammary artery to left anterior descending,      Sequential saphenous vein graft to ramus intermedius and obtuse marginal,      Saphenous vein graft to acute marginal, Endoscopic vein harvest from right thigh, Mitral valve repair with Edwards Physio II 32-mm annuloplasty ring (model #[id number], serial [id number]).  SURGEON:  Revonda Standard. Roxan Hockey, MD.  ASSISTANTLars Pinks, PA.  ANESTHESIA:  General.  FINDINGS:  Transesophageal echocardiography showed moderate to severe mitral regurgitation with posterior annular dilatation.  No residual mitral regurgitation post bypass.  Diffusely diseased coronaries, but good quality at the site of the anastomosis.  Saphenous vein and mammary artery good quality.    History of Present Illness:    The patient is a 72 year old male status post the above described procedure.  Post discharge he was sent to skilled nursing where he continues to reside.  He continues to have dialysis 3 times weekly on Monday, Wednesday, and Friday.  He denies any pain.  He denies shortness of breath.  He is not have  any fevers, chills or other constitutional symptoms although he is a poor historian.  He was seen recently by cardiology who have plans in the future to do a DC cardioversion for atrial flutter.  They are also monitoring his Coumadin dosing.      Past Medical History:  Diagnosis Date  . Abnormal stress test    s/p cath November 2013 with modest disease involving the ostial left main, proximal LAD, proximal RCA - do not appear to be hemodynamically signficant; mild LV dysfunction  . Bacteremia   . Chronic systolic CHF (congestive heart failure) ([place])   . COPD (chronic obstructive pulmonary disease) ([place])   . Coronary artery disease    per cath report 2013  . Ejection fraction < 50%   . Erectile dysfunction    penile implant  . ESRD (end stage renal disease) ([place])    Started HD in [place] in 2009, ESRD was due to DM. Moved to [facility] in Dec 2009 and now gets dialysis at [facility] on a MWF schedule.     . Gout    Hx: of  . Hepatitis C   . Hyperlipidemia   . Hypertension   . Obesity   . Retinopathy   . Shortness of breath    Hx: of with exertion  . Stroke ([place])   . Type II or unspecified type diabetes mellitus without mention of complication, not  stated as uncontrolled    adult onset     Social History   Tobacco Use  Smoking Status Former Smoker  . Years: 7.00  . Last attempt to quit: 06/10/1973  . Years since quitting: 44.4  Smokeless Tobacco Never Used  Tobacco Comment   Quit in 1974.    Social History   Substance and Sexual Activity  Alcohol Use No  . Alcohol/week: 0.0 oz     [name]  Current Outpatient Medications  Medication Sig Dispense Refill  . amiodarone (PACERONE) 200 MG tablet Take 1 tablet (200 mg total) by mouth 2 (two) times daily.    [name] aspirin EC 81 MG tablet Take 1 tablet (81 mg total) by mouth daily. 30 tablet 11  . atorvastatin (LIPITOR) 40 MG tablet Take 40 mg by mouth daily.     . B Complex-C-Folic Acid (NEPHRO-VITE PO) Take 1 tablet  by mouth daily.    . calcium acetate (PHOSLO) 667 MG capsule Take 2,001 mg by mouth 3 (three) times daily with meals.    . cinacalcet (SENSIPAR) 30 MG tablet Take 4 tablets (120 mg total) by mouth daily with supper. 60 tablet   . Darbepoetin Alfa (ARANESP) 100 MCG/0.5ML SOSY injection Inject 0.5 mLs (100 mcg total) into the vein every Monday with hemodialysis. 4.2 mL 5  . doxercalciferol (HECTOROL) 4 MCG/2ML injection Inject 1 mL (2 mcg total) into the vein every Monday, Wednesday, and Friday with hemodialysis. 2 mL   . ferric gluconate 250 mg in sodium chloride 0.9 % 100 mL Inject 250 mg into the vein every Monday, Wednesday, and Friday with hemodialysis.    . hydrALAZINE (APRESOLINE) 25 MG tablet Take 25 mg by mouth every 12 (twelve) hours.    . insulin detemir (LEVEMIR) 100 UNIT/ML injection Inject 0.2 mLs (20 Units total) into the skin 2 (two) times daily. 10 mL 11  . levETIRAcetam (KEPPRA) 500 MG tablet Take 2 tablets (1,000 mg total) by mouth daily. Then take extra 500 mg (1 tablet) on MWF after HD 72 tablet 5  . metoprolol tartrate (LOPRESSOR) 25 MG tablet Take 1 tablet (25 mg total) by mouth 2 (two) times daily.    . Nutritional Supplements (FEEDING SUPPLEMENT, NEPRO CARB STEADY,) LIQD Take 237 mLs by mouth 2 (two) times daily between meals.  0  . sennosides-docusate sodium (SENOKOT-S) 8.6-50 MG tablet Take 2 tablets by mouth 2 (two) times daily.    . sertraline (ZOLOFT) 25 MG tablet Take 25 mg by mouth at bedtime.     No current facility-administered medications for this visit.        Physical Exam: BP (!) 98/59 (BP Location: Right Arm, Patient Position: Sitting, Cuff Size: Large)   Resp 18   Ht 5\' 9"  (1.753 m)   Wt 217 lb (98.4 kg) Comment: last documented weight  BMI 32.05 kg/m   General appearance: alert, cooperative and no distress Heart: Slightly irregular, no murmur Lungs: Mildly diminished in bases Abdomen: Benign exam Extremities: No edema Wound: Incisions all  well-healed without   Diagnostic Studies & Laboratory data:     Recent Radiology Findings:   Dg Chest 2 View  Result Date: 11/11/2017 CLINICAL DATA:  72 year old male post CABG November 2018. Subsequent encounter. EXAM: CHEST  2 VIEW COMPARISON:  10/08/2017 chest x-ray. FINDINGS: Post CABG mitral valve replacement.  Cardiomegaly. Mild pulmonary vascular prominence most notable centrally. Decrease in size of left-sided pleural effusion and left base atelectasis. No acute osseous abnormality. Probable subclavian stent  projects over lateral aspect right upper lobe. IMPRESSION: Post CABG and mitral valve replacement.  Cardiomegaly. Mild pulmonary vascular prominence most notable centrally (slightly improved from prior exam). Decrease in size of left-sided pleural effusion and left base atelectasis. Electronically Signed   By: Genia Del M.D.   On: 11/11/2017 15:50      Recent Lab Findings: Lab Results  Component Value Date   WBC 7.3 11/08/2017   HGB 11.8 (A) 11/08/2017   HCT 36 (A) 11/08/2017   PLT 244 11/08/2017   GLUCOSE 193 (H) 10/08/2017   CHOL 116 10/28/2016   TRIG 92 10/28/2016   HDL 48 10/28/2016   LDLCALC 50 10/28/2016   ALT 14 11/08/2017   AST 17 11/08/2017   NA 138 11/08/2017   K 3.6 11/08/2017   CL 95 (L) 10/08/2017   CREATININE 3.7 (A) 11/08/2017   BUN 14 11/08/2017   CO2 26 10/08/2017   TSH 1.064 07/12/2015   INR 3.0 (A) 11/08/2017   HGBA1C 9.1 (H) 09/08/2017      Assessment / Plan: The patient is doing quite well from a surgical perspective.  We will see him again on a as needed basis for any surgically related needs or at request.          Salomon Giovanni, [name] 11/11/2017 4:05 PM

## 2017-11-11 NOTE — Patient Outreach (Signed)
[facility]) Care Management  11/11/2017  Ernest [name] 1945-11-16 [id number]   Phone call to Tillie Rung to discuss patient's progress in rehab. Per Tillie Rung, patient's spouse did not show up for care planning meeting scheduled for 11/05/17, however she continues to have plans for patient to to return home. Patient continues to work with PT and OT, there is no discharge date set yet. Discharge planner reminded that patient has been referred to the [facility] post discharge from rehab.   Home visit to see patient within 2 weeks.  [name] [facility] Care Management [phone]

## 2017-11-11 NOTE — Patient Instructions (Signed)
Continue to follow rehab recommendations at the skilled nursing facility

## 2017-11-13 DIAGNOSIS — D631 Anemia in chronic kidney disease: Secondary | ICD-10-CM | POA: Diagnosis not present

## 2017-11-13 DIAGNOSIS — N186 End stage renal disease: Secondary | ICD-10-CM | POA: Diagnosis not present

## 2017-11-13 DIAGNOSIS — D509 Iron deficiency anemia, unspecified: Secondary | ICD-10-CM | POA: Diagnosis not present

## 2017-11-13 DIAGNOSIS — N2581 Secondary hyperparathyroidism of renal origin: Secondary | ICD-10-CM | POA: Diagnosis not present

## 2017-11-13 DIAGNOSIS — E119 Type 2 diabetes mellitus without complications: Secondary | ICD-10-CM | POA: Diagnosis not present

## 2017-11-14 DIAGNOSIS — N186 End stage renal disease: Secondary | ICD-10-CM | POA: Diagnosis not present

## 2017-11-14 DIAGNOSIS — E1129 Type 2 diabetes mellitus with other diabetic kidney complication: Secondary | ICD-10-CM | POA: Diagnosis not present

## 2017-11-14 DIAGNOSIS — Z992 Dependence on renal dialysis: Secondary | ICD-10-CM | POA: Diagnosis not present

## 2017-11-15 DIAGNOSIS — Z992 Dependence on renal dialysis: Secondary | ICD-10-CM | POA: Diagnosis not present

## 2017-11-15 DIAGNOSIS — E119 Type 2 diabetes mellitus without complications: Secondary | ICD-10-CM | POA: Diagnosis not present

## 2017-11-15 DIAGNOSIS — N2581 Secondary hyperparathyroidism of renal origin: Secondary | ICD-10-CM | POA: Diagnosis not present

## 2017-11-15 DIAGNOSIS — E1129 Type 2 diabetes mellitus with other diabetic kidney complication: Secondary | ICD-10-CM | POA: Diagnosis not present

## 2017-11-15 DIAGNOSIS — D509 Iron deficiency anemia, unspecified: Secondary | ICD-10-CM | POA: Diagnosis not present

## 2017-11-15 DIAGNOSIS — N186 End stage renal disease: Secondary | ICD-10-CM | POA: Diagnosis not present

## 2017-11-15 DIAGNOSIS — D631 Anemia in chronic kidney disease: Secondary | ICD-10-CM | POA: Diagnosis not present

## 2017-11-18 DIAGNOSIS — N2581 Secondary hyperparathyroidism of renal origin: Secondary | ICD-10-CM | POA: Diagnosis not present

## 2017-11-18 DIAGNOSIS — E119 Type 2 diabetes mellitus without complications: Secondary | ICD-10-CM | POA: Diagnosis not present

## 2017-11-18 DIAGNOSIS — D631 Anemia in chronic kidney disease: Secondary | ICD-10-CM | POA: Diagnosis not present

## 2017-11-18 DIAGNOSIS — N186 End stage renal disease: Secondary | ICD-10-CM | POA: Diagnosis not present

## 2017-11-18 DIAGNOSIS — D509 Iron deficiency anemia, unspecified: Secondary | ICD-10-CM | POA: Diagnosis not present

## 2017-11-20 ENCOUNTER — Encounter: Payer: Self-pay | Admitting: Cardiology

## 2017-11-20 DIAGNOSIS — N186 End stage renal disease: Secondary | ICD-10-CM | POA: Diagnosis not present

## 2017-11-20 DIAGNOSIS — D509 Iron deficiency anemia, unspecified: Secondary | ICD-10-CM | POA: Diagnosis not present

## 2017-11-20 DIAGNOSIS — D631 Anemia in chronic kidney disease: Secondary | ICD-10-CM | POA: Diagnosis not present

## 2017-11-20 DIAGNOSIS — N2581 Secondary hyperparathyroidism of renal origin: Secondary | ICD-10-CM | POA: Diagnosis not present

## 2017-11-20 DIAGNOSIS — E119 Type 2 diabetes mellitus without complications: Secondary | ICD-10-CM | POA: Diagnosis not present

## 2017-11-22 ENCOUNTER — Ambulatory Visit: Payer: Self-pay | Admitting: *Deleted

## 2017-11-22 ENCOUNTER — Other Ambulatory Visit: Payer: Self-pay | Admitting: *Deleted

## 2017-11-22 DIAGNOSIS — N186 End stage renal disease: Secondary | ICD-10-CM | POA: Diagnosis not present

## 2017-11-22 DIAGNOSIS — N2581 Secondary hyperparathyroidism of renal origin: Secondary | ICD-10-CM | POA: Diagnosis not present

## 2017-11-22 DIAGNOSIS — D509 Iron deficiency anemia, unspecified: Secondary | ICD-10-CM | POA: Diagnosis not present

## 2017-11-22 DIAGNOSIS — E119 Type 2 diabetes mellitus without complications: Secondary | ICD-10-CM | POA: Diagnosis not present

## 2017-11-22 DIAGNOSIS — D631 Anemia in chronic kidney disease: Secondary | ICD-10-CM | POA: Diagnosis not present

## 2017-11-22 NOTE — Patient Outreach (Signed)
[facility]) Care Management  11/22/2017  TOSHIRO HANKEN 1946-05-01 [id number]   Phone call to the discharge planner for a status update on patient's progress in rehab and to discuss any anticipated discharge plans. Tillie Rung not available, voicemail message left for a return call.    [name] [facility] Care Management [phone]

## 2017-11-25 ENCOUNTER — Other Ambulatory Visit: Payer: Self-pay | Admitting: *Deleted

## 2017-11-25 DIAGNOSIS — E119 Type 2 diabetes mellitus without complications: Secondary | ICD-10-CM | POA: Diagnosis not present

## 2017-11-25 DIAGNOSIS — D509 Iron deficiency anemia, unspecified: Secondary | ICD-10-CM | POA: Diagnosis not present

## 2017-11-25 DIAGNOSIS — N186 End stage renal disease: Secondary | ICD-10-CM | POA: Diagnosis not present

## 2017-11-25 DIAGNOSIS — D631 Anemia in chronic kidney disease: Secondary | ICD-10-CM | POA: Diagnosis not present

## 2017-11-25 DIAGNOSIS — N2581 Secondary hyperparathyroidism of renal origin: Secondary | ICD-10-CM | POA: Diagnosis not present

## 2017-11-26 ENCOUNTER — Encounter: Payer: Self-pay | Admitting: *Deleted

## 2017-11-26 ENCOUNTER — Ambulatory Visit (INDEPENDENT_AMBULATORY_CARE_PROVIDER_SITE_OTHER): Payer: Medicare Other | Admitting: Neurology

## 2017-11-26 ENCOUNTER — Encounter: Payer: Self-pay | Admitting: Neurology

## 2017-11-26 VITALS — BP 122/58 | HR 89 | Ht 69.0 in | Wt 217.0 lb

## 2017-11-26 DIAGNOSIS — I10 Essential (primary) hypertension: Secondary | ICD-10-CM

## 2017-11-26 DIAGNOSIS — I2510 Atherosclerotic heart disease of native coronary artery without angina pectoris: Secondary | ICD-10-CM

## 2017-11-26 DIAGNOSIS — E1165 Type 2 diabetes mellitus with hyperglycemia: Secondary | ICD-10-CM | POA: Diagnosis not present

## 2017-11-26 DIAGNOSIS — I639 Cerebral infarction, unspecified: Secondary | ICD-10-CM

## 2017-11-26 DIAGNOSIS — E1122 Type 2 diabetes mellitus with diabetic chronic kidney disease: Secondary | ICD-10-CM | POA: Diagnosis not present

## 2017-11-26 DIAGNOSIS — G40209 Localization-related (focal) (partial) symptomatic epilepsy and epileptic syndromes with complex partial seizures, not intractable, without status epilepticus: Secondary | ICD-10-CM | POA: Diagnosis not present

## 2017-11-26 DIAGNOSIS — IMO0002 Reserved for concepts with insufficient information to code with codable children: Secondary | ICD-10-CM

## 2017-11-26 DIAGNOSIS — G9341 Metabolic encephalopathy: Secondary | ICD-10-CM

## 2017-11-26 DIAGNOSIS — N186 End stage renal disease: Secondary | ICD-10-CM

## 2017-11-26 DIAGNOSIS — E785 Hyperlipidemia, unspecified: Secondary | ICD-10-CM

## 2017-11-26 NOTE — Patient Outreach (Signed)
[facility]) Care Management  11/26/2017  Ernest [name] Oct 28, 1945 [id number]   Phone call to patient's spouse to discuss plan of care. Patient's spouse is currently at [facility] following bypass surgery. Per patient's spouse, she is unable to provide the care patient needs at home and has decided that patient will remain at [facility] on a long term basis. Patient's spouse understands that she will need to apply for long term care medicaid and will plan to do this in person on 11/28/17. This Education officer, museum provided supportive counseling regarding this decision. Case closure discussed.  This social worker's contact information provided for any additional community resource needs arise in the future.  Plan: This social worker will plan to close patient to [facility] care management due to plan for patient to remain at [facility] under long term care.    [name] [facility] Care Management [phone]

## 2017-11-26 NOTE — Progress Notes (Signed)
NEUROLOGY FOLLOW UP OFFICE NOTE  Ernest [name] [id number]  HISTORY OF PRESENT ILLNESS: Ernest [name] is a 72 year old right-handed man with hyperlipidemia, ESRD on HD (M/W/F), type II diabetes mellitus, hypertension, COPD, gout, chronic CHF, coronary artery disease, hepatitis C, penile implant (unable to get MRI) and history of stroke who presents for hospital follow up.   UPDATE: He is taking Keppra 1000mg  daily and 500mg  following dialysis.  No recent seizures.   He was admitted to [facility] on 09/07/17 with worsening shortness of breath.  He underwent cardiac catheterization and was found to have significant coronary artery disease with LM involvement.  He underwent CABG x 4 and mitral valve repair.  He exhibited post-operative confusion despite discontinuation of sedating medications.  CT of head showed no acute findings.   Blood pressure was low, requiring transient levophed and Midodrine.  He was started on amiodarone for atrial flutter  HISTORY: Around October 2015, he developed slurred speech.  He also experienced ringing in his right ear, with sensation of water in his right ear.  He did not seek medical attention at the time.  There was no facial droop or difficulty walking or swallowing.  He decided to go to the ED on 09/21/14 for an evaluation as the slurred speech and tinnitus persisted.  He denied weakness but ED exam revealed right arm weakness.  CT of the head was performed, which revealed chronic small vessel ischemic changes, including remote right cerebellar, left pontine and right basal ganglia lacunar infarcts.  No acute infarction was seen.     On 04/25/15, he developed right hand cramping and dystonia.  When EMS arrived, he had jerking of the right hand that then generalized to a tonic-clonic seizure.  He had two other seizures, in the ambulance and in the ED, lasting 30-40 seconds.  CT of the head showed chronic small vessel disease and moderate brain atrophy.  He  was started on Keppra 500mg  twice daily.    He was admitted to [facility] on 04/29/15 for seizure, which was thought to be secondary to hyperglycemia with a blood glucose level of 662.  MRI of the brain was withheld because he had a penile implant that contains metal.  Keppra was increased to 1000mg  daily with 500mg  following each dialysis.   He was re-admitted to [facility] again on 07/11/15 for presumed acute right pontine infarct.  He presented with diplopia and unsteady gait.  On exam, he was found to have one and a half syndrome.  CT of head and repeat CT of head did not reveal acute infarct. 2D echo showed EF 45-50% with grade 1 diastolic dysfunction.  Carotid doppler showed no hemodynamically significant ICA stenosis    He was admitted to [facility] from 10/27/16 to 10/29/16 for 2 to 3 days of persistent left leg weakness.  He did not have other associated symptoms, such as other extremity weakness, numbness, visual disturbance, unsteady gait or worsening of residual dysarthria.  Serum glucose was 263.  CT of head was personally reviewed and was negative for acute findings.  Unable to obtain MRI due to metal penile implant, so repeat head the CT on the following day was also negative.  Carotid doppler was negative for hemodynamically significant ICA stenosis.  LDL was 50.  Hgb A1c was elevated at 8.7.  Weakness was thought to be due to hypotension during HD.  He was advised to stop Lisinopril but otherwise continue is current regimen with dual  antiplatelet therapy.  PAST MEDICAL HISTORY: Past Medical History:  Diagnosis Date  . Abnormal stress test    s/p cath November 2013 with modest disease involving the ostial left main, proximal LAD, proximal RCA - do not appear to be hemodynamically signficant; mild LV dysfunction  . Bacteremia   . Chronic systolic CHF (congestive heart failure) ([place])   . COPD (chronic obstructive pulmonary disease) ([place])   . Coronary artery disease    per cath report 2013    . Ejection fraction < 50%   . Erectile dysfunction    penile implant  . ESRD (end stage renal disease) ([place])    Started HD in [place] in 2009, ESRD was due to DM. Moved to [facility] in Dec 2009 and now gets dialysis at [facility] on a MWF schedule.     . Gout    Hx: of  . Hepatitis C   . Hyperlipidemia   . Hypertension   . Obesity   . Retinopathy   . Shortness of breath    Hx: of with exertion  . Stroke ([place])   . Type II or unspecified type diabetes mellitus without mention of complication, not stated as uncontrolled    adult onset    MEDICATIONS: Current Outpatient Medications on File Prior to Visit  Medication Sig Dispense Refill  . amiodarone (PACERONE) 200 MG tablet Take 1 tablet (200 mg total) by mouth 2 (two) times daily.    [name] aspirin EC 81 MG tablet Take 1 tablet (81 mg total) by mouth daily. 30 tablet 11  . atorvastatin (LIPITOR) 40 MG tablet Take 40 mg by mouth daily.     . B Complex-C-Folic Acid (NEPHRO-VITE PO) Take 1 tablet by mouth daily.    . calcium acetate (PHOSLO) 667 MG capsule Take 2,001 mg by mouth 3 (three) times daily with meals.    . cinacalcet (SENSIPAR) 30 MG tablet Take 4 tablets (120 mg total) by mouth daily with supper. 60 tablet   . Darbepoetin Alfa (ARANESP) 100 MCG/0.5ML SOSY injection Inject 0.5 mLs (100 mcg total) into the vein every Monday with hemodialysis. 4.2 mL 5  . doxercalciferol (HECTOROL) 4 MCG/2ML injection Inject 1 mL (2 mcg total) into the vein every Monday, Wednesday, and Friday with hemodialysis. 2 mL   . ferric gluconate 250 mg in sodium chloride 0.9 % 100 mL Inject 250 mg into the vein every Monday, Wednesday, and Friday with hemodialysis.    . hydrALAZINE (APRESOLINE) 25 MG tablet Take 25 mg by mouth every 12 (twelve) hours.    . insulin detemir (LEVEMIR) 100 UNIT/ML injection Inject 0.2 mLs (20 Units total) into the skin 2 (two) times daily. 10 mL 11  . levETIRAcetam (KEPPRA) 500 MG tablet Take 2 tablets (1,000 mg total) by mouth daily.  Then take extra 500 mg (1 tablet) on MWF after HD 72 tablet 5  . metoprolol tartrate (LOPRESSOR) 25 MG tablet Take 1 tablet (25 mg total) by mouth 2 (two) times daily.    . Nutritional Supplements (FEEDING SUPPLEMENT, NEPRO CARB STEADY,) LIQD Take 237 mLs by mouth 2 (two) times daily between meals.  0  . sennosides-docusate sodium (SENOKOT-S) 8.6-50 MG tablet Take 2 tablets by mouth 2 (two) times daily.    . sertraline (ZOLOFT) 25 MG tablet Take 25 mg by mouth at bedtime.     No current facility-administered medications on file prior to visit.     ALLERGIES: [name]  FAMILY HISTORY: Family History  Problem Relation  Age of Onset  . Heart disease Father   . Diabetes Sister   . Diabetes Brother   . Diabetes Son     SOCIAL HISTORY: Social History   Socioeconomic History  . Marital status: Married    Spouse name: Not on file  . Number of children: Not on file  . Years of education: Not on file  . Highest education level: Not on file  Social Needs  . Financial resource strain: Not on file  . Food insecurity - worry: Not on file  . Food insecurity - inability: Not on file  . Transportation needs - medical: Not on file  . Transportation needs - non-medical: Not on file  Occupational History  . Occupation: Cabin crew: DISABLED    Comment: disabled  Tobacco Use  . Smoking status: Former Smoker    Years: 7.00    Last attempt to quit: 06/10/1973    Years since quitting: 44.4  . Smokeless tobacco: Never Used  . Tobacco comment: Quit in 1974.  Substance and Sexual Activity  . Alcohol use: No    Alcohol/week: 0.0 oz  . Drug use: No  . Sexual activity: Not on file  Other Topics Concern  . Not on file  Social History Narrative   Adopted mentally retarded child at home, 2 adopted children, 3 living and 2 deceased.    REVIEW OF SYSTEMS: Constitutional: No fevers, chills, or sweats, no generalized fatigue, change in appetite Eyes: No visual changes,  double vision, eye pain Ear, nose and throat: No hearing loss, ear pain, nasal congestion, sore throat Cardiovascular: No chest pain, palpitations Respiratory:  No shortness of breath at rest or with exertion, wheezes GastrointestinaI: No nausea, vomiting, diarrhea, abdominal pain, fecal incontinence Genitourinary:  No dysuria, urinary retention or frequency Musculoskeletal:  No neck pain, back pain Integumentary: No rash, pruritus, skin lesions Neurological: as above Psychiatric: No depression, insomnia, anxiety Endocrine: No palpitations, fatigue, diaphoresis, mood swings, change in appetite, change in weight, increased thirst Hematologic/Lymphatic:  No purpura, petechiae. Allergic/Immunologic: no itchy/runny eyes, nasal congestion, recent allergic reactions, rashes  PHYSICAL EXAM: Vitals:   11/26/17 1137  BP: (!) 122/58  Pulse: 89  SpO2: 95%   General: No acute distress.  Head:  Normocephalic/atraumatic Eyes:  Fundi examined but not visualized Neck: supple, no paraspinal tenderness, full range of motion Heart:  Regular rate and rhythm Lungs:  Clear to auscultation bilaterally Back: No paraspinal tenderness Neurological Exam: alert and oriented to person, place, and time. Attention span and concentration intact, recalled 1/3 words after 5 minutes, remote memory intact, fund of knowledge intact.  Speech fluent and not dysarthric, language intact.  CN II-XII intact. Bulk and tone normal, muscle strength 5/5 throughout.  Sensation to light touch  intact.  Deep tendon reflexes absent throughout.  Finger to nose testing intact.    IMPRESSION: 1.  Post-operative confusion.  Likely metabolic encephalopathy/pharmacologic effect in setting of underlying vascular cognitive impairment. 2.  CVA 3.  Symptomatic localization related seizure with secondary generalization 4.  Atrial flutter  PLAN: 1.  Keppra 1000mg  twice daily with 500mg  following dialysis 2.  anticoagulation for atrial  flutter 3.  Continue statin therapy as per PCP (LDL goal less than 70) 4.  Maintain blood pressure control as per PCP 5.  Advised that he should go to the ED if he feels that he may be having a stroke. 6.  Optimize glycemic control as per PCP 7.   Follow up in July  as scheduled.  15 minutes spent face to face with patient, over 50% spent discussing management.  Metta Clines, DO

## 2017-11-27 DIAGNOSIS — N186 End stage renal disease: Secondary | ICD-10-CM | POA: Diagnosis not present

## 2017-11-27 DIAGNOSIS — D631 Anemia in chronic kidney disease: Secondary | ICD-10-CM | POA: Diagnosis not present

## 2017-11-27 DIAGNOSIS — N2581 Secondary hyperparathyroidism of renal origin: Secondary | ICD-10-CM | POA: Diagnosis not present

## 2017-11-27 DIAGNOSIS — E119 Type 2 diabetes mellitus without complications: Secondary | ICD-10-CM | POA: Diagnosis not present

## 2017-11-27 DIAGNOSIS — D509 Iron deficiency anemia, unspecified: Secondary | ICD-10-CM | POA: Diagnosis not present

## 2017-11-28 ENCOUNTER — Encounter: Payer: Self-pay | Admitting: Cardiology

## 2017-11-28 ENCOUNTER — Ambulatory Visit (INDEPENDENT_AMBULATORY_CARE_PROVIDER_SITE_OTHER): Payer: Medicare Other | Admitting: Cardiology

## 2017-11-28 VITALS — BP 158/70 | HR 77 | Ht 69.0 in

## 2017-11-28 DIAGNOSIS — N186 End stage renal disease: Secondary | ICD-10-CM | POA: Diagnosis not present

## 2017-11-28 DIAGNOSIS — Z992 Dependence on renal dialysis: Secondary | ICD-10-CM

## 2017-11-28 DIAGNOSIS — I4892 Unspecified atrial flutter: Secondary | ICD-10-CM | POA: Diagnosis not present

## 2017-11-28 DIAGNOSIS — IMO0002 Reserved for concepts with insufficient information to code with codable children: Secondary | ICD-10-CM

## 2017-11-28 DIAGNOSIS — I5042 Chronic combined systolic (congestive) and diastolic (congestive) heart failure: Secondary | ICD-10-CM | POA: Diagnosis not present

## 2017-11-28 DIAGNOSIS — E1165 Type 2 diabetes mellitus with hyperglycemia: Secondary | ICD-10-CM

## 2017-11-28 DIAGNOSIS — I2510 Atherosclerotic heart disease of native coronary artery without angina pectoris: Secondary | ICD-10-CM

## 2017-11-28 DIAGNOSIS — E1122 Type 2 diabetes mellitus with diabetic chronic kidney disease: Secondary | ICD-10-CM

## 2017-11-28 DIAGNOSIS — Z951 Presence of aortocoronary bypass graft: Secondary | ICD-10-CM

## 2017-11-28 DIAGNOSIS — I6930 Unspecified sequelae of cerebral infarction: Secondary | ICD-10-CM | POA: Diagnosis not present

## 2017-11-28 NOTE — Patient Instructions (Addendum)
Medication Instructions:  Your physician recommends that you continue on your current medications as directed. Please refer to the Current Medication list given to you today.   Labwork: None ordered  Testing/Procedures: None ordered  Follow-Up: Your physician recommends that you schedule a follow-up appointment in: 12/23/17 ARRIVE AT 1:15   Any Other Special Instructions Will Be Listed Below (If Applicable). HAVE [facility] CHECK YOUR INR EVERY WEEK AND FAX [place] A COPY OF THE RESULTS.    If you need a refill on your cardiac medications before your next appointment, please call your pharmacy.

## 2017-11-28 NOTE — Progress Notes (Signed)
Cardiology Office Note:    Date:  11/28/2017   ID:  Ernest [name], DOB 22-Mar-1946, MRN [id number]  PCP:  Patient, [name]  Cardiologist:  Larae Grooms, MD  Referring MD: No ref. provider found   Chief Complaint  Patient presents with  . Atrial Flutter    History of Present Illness:    Ernest [name] is a 72 y.o. male with a past medical history significant for CAD S/P CABG X4 and mitral valve repair 09/13/17,   ESRD on HD MWF, chronic systolic heart fialure, COPD, HTN, HLD, DM type II, Seizures and HX CVA.   The patient had NSTEMI 09/08/17 and underwent cath with significant coronary calcification with high-grade 65% ostial and 90-95% distal left main stenosis, calcification of the proximal LAD with 20-25% narrowing, large ramus intermediate vessel; total occlusion of the left circumflex at the ostium. Dominant RCA with 60% ostial proximal narrowing in an RV marginal branch and collateralization to the distal circumflex.  Also severe MR. He underwent CABG X4 and mitral valve repair on 09/13/2017. He had confusion postoperatively and has been Residing in a skilled nursing facility since his CABG.   The patient developed atrial flutter postoperatively and was started on amiodarone and coumadin. He converted to SR prior to discharge but was in atrial flutter at follow up on 11/07/17.   Ernest [name] is here today with his wife for follow up of atrial flutter. He denies chest discomfort, dyspnea, orthopnea, edema, palpitations. lightheadedness or syncope. He is working with PT at [facility] and feels that he is gradually getting stronger. He feels like he is getting strength back in his legs. He still has residual left sided weakness and slurred speech from his stroke. His only complaint is feeling tired after dialysis. He walks with a walker and staff assist. He is currently in a wheelchair. He does not seem to have any awareness of bign in aflutter.   His medications are given by  staff at [facility] and his INR is monitored there. His INR was therapeutic at 2.4 until Most recent on 11/25/17 was 1.6.    Past Medical History:  Diagnosis Date  . Abnormal stress test    s/p cath November 2013 with modest disease involving the ostial left main, proximal LAD, proximal RCA - do not appear to be hemodynamically signficant; mild LV dysfunction  . Bacteremia   . Chronic systolic CHF (congestive heart failure) ([place])   . COPD (chronic obstructive pulmonary disease) ([place])   . Coronary artery disease    a. per cath report 2013, b. 09/13/2017-CABG X4 & Mitral valve repair  . Ejection fraction < 50%   . Erectile dysfunction    penile implant  . ESRD (end stage renal disease) ([place])    Started HD in [place] in 2009, ESRD was due to DM. Moved to [facility] in Dec 2009 and now gets dialysis at [facility] on a MWF schedule.     . Gout    Hx: of  . Hepatitis C   . Hyperlipidemia   . Hypertension   . Mitral valve disease    s/p Mitral repair 09/13/17  . Obesity   . Retinopathy   . Shortness of breath    Hx: of with exertion  . Stroke ([place])   . Type II or unspecified type diabetes mellitus without mention of complication, not stated as uncontrolled    adult onset    Past Surgical History:  Procedure Laterality Date  .  A/V FISTULAGRAM N/A 10/04/2017   Procedure: A/V FISTULAGRAM;  Surgeon: Elam Dutch, MD;  Location: [place] CV [facility];  Service: Cardiovascular;  Laterality: N/A;  . AV FISTULA PLACEMENT Left 01/13/2013   Procedure: INSERTION OF ARTERIOVENOUS (AV) GORE-TEX GRAFT ARM;  Surgeon: Angelia Mould, MD;  Location: [place];  Service: Vascular;  Laterality: Left;  . AV FISTULA PLACEMENT Left 05/05/2013   Procedure: INSERTION OF LEFT UPPER ARM  ARTERIOVENOUS GORTEX GRAFT;  Surgeon: Angelia Mould, MD;  Location: [place];  Service: Vascular;  Laterality: Left;  . [place] REMOVAL Left 03/26/2013   Procedure: REMOVAL OF  NON INCORPORATED ARTERIOVENOUS GORETEX GRAFT ([place])  left arm * repair of  left brachial artery with vein patch angioplasty.;  Surgeon: Angelia Mould, MD;  Location: [place];  Service: Vascular;  Laterality: Left;  . CARDIAC CATHETERIZATION  August 26, 2012  . CATARACT EXTRACTION W/ INTRAOCULAR LENS  IMPLANT, BILATERAL    . COLONOSCOPY    . CORONARY ARTERY BYPASS GRAFT N/A 09/13/2017   Procedure: CORONARY ARTERY BYPASS GRAFTING (CABG) times four LIMA  to LAD, SVG sequentially to OM and RAMUS Intermediate and SVG to Acute Marginal;  Surgeon: Melrose Nakayama, MD;  Location: [place];  Service: Open Heart Surgery;  Laterality: N/A;  . EMBOLECTOMY Right 08/27/2013   Procedure: EMBOLECTOMY;  Surgeon: Mal Misty, MD;  Location: [place];  Service: Vascular;  Laterality: Right;  Thrombectomy of Radial and ulnar artery.  [name] EYE SURGERY     laser.  and surgery for DM  . fistula     RUE and wrist  . INSERTION OF DIALYSIS CATHETER Right 01/01/2013   Procedure: INSERTION OF DIALYSIS CATHETER right  internal jugular;  Surgeon: Serafina Mitchell, MD;  Location: [place];  Service: Vascular;  Laterality: Right;  . INSERTION OF DIALYSIS CATHETER N/A 03/26/2013   Procedure: INSERTION OF DIALYSIS CATHETER Left internal jugular vein;  Surgeon: Angelia Mould, MD;  Location: [place];  Service: Vascular;  Laterality: N/A;  . LEFT HEART CATH AND CORONARY ANGIOGRAPHY N/A 09/09/2017   Procedure: LEFT HEART CATH AND CORONARY ANGIOGRAPHY;  Surgeon: Troy Sine, MD;  Location: [place] CV [facility];  Service: Cardiovascular;  Laterality: N/A;  . LIGATION OF ARTERIOVENOUS  FISTULA Right 12/30/2013   Procedure: REMOVAL OF SEGMENT OF GORTEX GRAFT AND FISTULA  AND REPAIR OF BRACHIAL ARTERY;  Surgeon: Mal Misty, MD;  Location: [place];  Service: Vascular;  Laterality: Right;  . MITRAL VALVE REPAIR N/A 09/13/2017   Procedure: MITRAL VALVE REPAIR (MVR);  Surgeon: Melrose Nakayama, MD;  Location: [place];  Service: Open Heart Surgery;  Laterality: N/A;  . MITRAL  VALVE REPAIR (MV)/CORONARY ARTERY BYPASS GRAFTING (CABG)  09/13/2017  . PENILE PROSTHESIS IMPLANT     1997.. no card  . PERIPHERAL VASCULAR BALLOON ANGIOPLASTY Left 10/04/2017   Procedure: PERIPHERAL VASCULAR BALLOON ANGIOPLASTY;  Surgeon: Elam Dutch, MD;  Location: [place] [facility];  Service: Cardiovascular;  Laterality: Left;  AVF  . TEE WITHOUT CARDIOVERSION N/A 06/11/2013   Procedure: TRANSESOPHAGEAL ECHOCARDIOGRAM (TEE);  Surgeon: Larey Dresser, MD;  Location: [facility] ENDOSCOPY;  Service: Cardiovascular;  Laterality: N/A;  . TEE WITHOUT CARDIOVERSION N/A 09/13/2017   Procedure: TRANSESOPHAGEAL ECHOCARDIOGRAM (TEE);  Surgeon: Melrose Nakayama, MD;  Location: [place];  Service: Open Heart Surgery;  Laterality: N/A;  . THROMBECTOMY W/ EMBOLECTOMY Left 03/26/2013   Procedure: Attempted thrombectomy of left arm arteriovenous goretex graft.;  Surgeon: Angelia Mould,  MD;  Location: [place];  Service: Vascular;  Laterality: Left;  [name] VENOGRAM N/A 04/07/2013   Procedure: VENOGRAM;  Surgeon: Serafina Mitchell, MD;  Location: [facility] CATH LAB;  Service: Cardiovascular;  Laterality: N/A;    Current Medications: Current Meds  Medication Sig  . amiodarone (PACERONE) 200 MG tablet Take 1 tablet (200 mg total) by mouth 2 (two) times daily.  [name] aspirin EC 81 MG tablet Take 1 tablet (81 mg total) by mouth daily.  [name] atorvastatin (LIPITOR) 40 MG tablet Take 40 mg by mouth daily.   . B Complex-C-Folic Acid (NEPHRO-VITE PO) Take 1 tablet by mouth daily.  . calcium acetate (PHOSLO) 667 MG capsule Take 2,001 mg by mouth 3 (three) times daily with meals.  . cinacalcet (SENSIPAR) 30 MG tablet Take 4 tablets (120 mg total) by mouth daily with supper.  . ferric citrate (AURYXIA) 1 GM 210 MG(Fe) tablet Take 420 mg by mouth 3 (three) times daily with meals.  . insulin detemir (LEVEMIR) 100 UNIT/ML injection Inject 0.2 mLs (20 Units total) into the skin 2 (two) times daily.  [name] levETIRAcetam (KEPPRA) 500 MG tablet  Take 2 tablets (1,000 mg total) by mouth daily. Then take extra 500 mg (1 tablet) on MWF after HD  . metoprolol tartrate (LOPRESSOR) 25 MG tablet Take 1 tablet (25 mg total) by mouth 2 (two) times daily.  . Nutritional Supplements (FEEDING SUPPLEMENT, NEPRO CARB STEADY,) LIQD Take 237 mLs by mouth 2 (two) times daily between meals.  . sennosides-docusate sodium (SENOKOT-S) 8.6-50 MG tablet Take 2 tablets by mouth 2 (two) times daily.  . sertraline (ZOLOFT) 25 MG tablet Take 25 mg by mouth at bedtime.  [name] warfarin (COUMADIN) 5 MG tablet daily. Take as directed     Allergies:   Patient has no known allergies.   Social History   Socioeconomic History  . Marital status: Married    Spouse name: None  . Number of children: None  . Years of education: None  . Highest education level: None  Social Needs  . Financial resource strain: None  . Food insecurity - worry: None  . Food insecurity - inability: None  . Transportation needs - medical: None  . Transportation needs - non-medical: None  Occupational History  . Occupation: Cabin crew: DISABLED    Comment: disabled  Tobacco Use  . Smoking status: Former Smoker    Years: 7.00    Last attempt to quit: 06/10/1973    Years since quitting: 44.4  . Smokeless tobacco: Never Used  . Tobacco comment: Quit in 1974.  Substance and Sexual Activity  . Alcohol use: No    Alcohol/week: 0.0 oz  . Drug use: No  . Sexual activity: None  Other Topics Concern  . None  Social History Narrative   Adopted mentally retarded child at home, 2 adopted children, 3 living and 2 deceased.     Family History: The patient's family history includes Diabetes in his brother, sister, and son; Heart disease in his father. ROS:   Please see the history of present illness.     All other systems reviewed and are negative.  EKGs/Labs/Other Studies Reviewed:    The following studies were reviewed today:  Echo 09/08/17 Study Conclusions  -  Left ventricle: Inferobasal and posterior lateral hypokinesis EF   similar to echo from 2016. The cavity size was normal. Wall   thickness was normal. Systolic function was mildly reduced. The   estimated ejection fraction  was in the range of 45% to 50%. - Mitral valve: Calcified annulus. Mildly thickened leaflets .   There was mild regurgitation. - Left atrium: The atrium was moderately dilated. - Pulmonary arteries: PA peak pressure: 50 mm Hg (S).   Cardiac cath 09/09/17 Conclusion    Ost LM lesion is 65% stenosed.  Mid LM lesion is 90% stenosed.  Ost Cx to Prox Cx lesion is 100% stenosed.  Ost LAD lesion is 20% stenosed.  Acute Mrg lesion is 60% stenosed.  Significant coronary calcification with high-grade 65% ostial and 90-95% distal left main stenosis, calcification of the proximal LAD with 20-25% narrowing, large ramus intermediate vessel; total occlusion of the left circumflex at the ostium. Dominant RCA with 60% ostial proximal narrowing in an RV marginal branch and collateralization to the distal circumflex.  LVEDP 14 mm Hg.  RECOMMENDATION: Surgical consultation for CABG revascularization.     EKG:  EKG is ordered today.  The ekg ordered today demonstrates atrial flutter at 77 bpm, LAFB, QTC 468  Recent Labs: 09/08/2017: B Natriuretic Peptide 4,125.9 09/20/2017: Magnesium 2.0 11/08/2017: ALT 14; BUN 14; Creatinine 3.7; Hemoglobin 11.8; Platelets 244; Potassium 3.6; Sodium 138   Recent Lipid Panel    Component Value Date/Time   CHOL 116 10/28/2016 0442   TRIG 92 10/28/2016 0442   HDL 48 10/28/2016 0442   CHOLHDL 2.4 10/28/2016 0442   VLDL 18 10/28/2016 0442   LDLCALC 50 10/28/2016 0442    Physical Exam:    VS:  BP (!) 158/70-rechecked by me- 122/64 after rest   Pulse 77   Ht 5\' 9"  (1.753 m)   BMI 32.05 kg/m     Wt Readings from Last 3 Encounters:  11/26/17 217 lb (98.4 kg)  11/11/17 217 lb (98.4 kg)  11/08/17 217 lb (98.4 kg)      Physical Exam  Constitutional: He is oriented to person, place, and time. No distress.  Chronically ill appearing, in wheelchair  HENT:  Head: Normocephalic and atraumatic.  Neck: Normal range of motion. Neck supple. No JVD present.  Cardiovascular: Normal rate and normal heart sounds. Exam reveals no gallop and no friction rub.  No murmur heard. Very mildly irregular rhythm  Pulmonary/Chest: Effort normal and breath sounds normal. No respiratory distress. He has no wheezes. He has no rales.  Abdominal: Soft. Bowel sounds are normal. He exhibits no distension.  Musculoskeletal: He exhibits no edema or deformity.  Neurological: He is alert and oriented to person, place, and time.  Speech difficult to understand. Left sided weakness.   Skin: Skin is warm and dry.  Psychiatric: He has a normal mood and affect. His behavior is normal. Thought content normal.    ASSESSMENT:    1. Coronary artery disease involving native coronary artery of native heart without angina pectoris   2. Chronic combined systolic and diastolic CHF (congestive heart failure) ([place])   3. NSVT (nonsustained ventricular tachycardia) ([facility])   4. Paroxysmal atrial flutter ([place])   5. End-stage renal disease on hemodialysis ([place])   6. History of CVA with residual deficit   7. S/P CABG x 4   8. Uncontrolled type 2 diabetes mellitus with end-stage renal disease ([place])    PLAN:    In order of problems listed above:  Atrial flutter: Continues to be in atrial flutter with controlled rate. No identifiable symptoms. Moderately dilated LA by echo done preCABG/MV repair. On amiodarone 200 mg BID and lopressor 25 mg bid.  CHA2DS2/VAS Stroke Risk Score  7 (  CHF, CAD, HTN, age, DM, stroke (2). Anticoagulated with warfarin. INR followed at SNF. Was therapeutic at 2.4 until Most recent on 11/25/17 was 1.6.  Cannot proceed with DCCV at present due to INR not therapeutic. Discussed with Dr. Radford Pax, DOD. Since pt asymptomatic, will work  toward therapeutic INR and try DCCV after 3 weeks of therapeutic INR. If pt becomes symptomatic, could do TEE guided DCCV. Instructions to monitor INR weekly and send to our office until DCCV done sent to SNF. Continue current medications.  All plans discussed with the patient and his wife. DCCV procedure also explained.       CAD: s/p CABG with MV repair 09/13/17. Pt is at [place] getting physical therapy. States that he is getting progressively stronger. Denies chest pain or dyspnea. No S/S of heart failure.   Mitral valve disease s/p MV repair 09/13/17. No murmur  ESRD on HD MWF: had graft revision while hospitalized. Reports no problems with dialysis.   Hx CVA: On aspirin and statin. No hemodynamically significant carotid artery stenosis. Pt with residual difficult to understading speech but answers questions appropriately, left sided weakness.   Hypertension: hydralazine 25 mg bid, lopressor 25 mg bid. Repeat BP in office 122/64. Pt reports occ low BP on dialysis. Continue current therapy.   Hyperlipidemia:  Lipitor 40 mg daily. LDL 50 on 10/28/16, at goal of <70. Continue current therapy.    Diabetes type 2 on insulin: Poorly controlled. Hgb A1c 9.1 in 08/2017. May have better control in SNF with controlled meds and diet.   Medication Adjustments/Labs and Tests Ordered: Current medicines are reviewed at length with the patient today.  Concerns regarding medicines are outlined above. Labs and tests ordered and medication changes are outlined in the patient instructions below:  Patient Instructions  Medication Instructions:  Your physician recommends that you continue on your current medications as directed. Please refer to the Current Medication list given to you today.   Labwork: None ordered  Testing/Procedures: None ordered  Follow-Up: Your physician recommends that you schedule a follow-up appointment in:    Any Other Special Instructions Will Be Listed  Below (If Applicable).     If you need a refill on your cardiac medications before your next appointment, please call your pharmacy.      Signed, Daune Perch, NP  11/28/2017 2:49 PM    [facility]

## 2017-11-29 DIAGNOSIS — D509 Iron deficiency anemia, unspecified: Secondary | ICD-10-CM | POA: Diagnosis not present

## 2017-11-29 DIAGNOSIS — N186 End stage renal disease: Secondary | ICD-10-CM | POA: Diagnosis not present

## 2017-11-29 DIAGNOSIS — N2581 Secondary hyperparathyroidism of renal origin: Secondary | ICD-10-CM | POA: Diagnosis not present

## 2017-11-29 DIAGNOSIS — E119 Type 2 diabetes mellitus without complications: Secondary | ICD-10-CM | POA: Diagnosis not present

## 2017-11-29 DIAGNOSIS — D631 Anemia in chronic kidney disease: Secondary | ICD-10-CM | POA: Diagnosis not present

## 2017-12-02 DIAGNOSIS — E119 Type 2 diabetes mellitus without complications: Secondary | ICD-10-CM | POA: Diagnosis not present

## 2017-12-02 DIAGNOSIS — N186 End stage renal disease: Secondary | ICD-10-CM | POA: Diagnosis not present

## 2017-12-02 DIAGNOSIS — D509 Iron deficiency anemia, unspecified: Secondary | ICD-10-CM | POA: Diagnosis not present

## 2017-12-02 DIAGNOSIS — N2581 Secondary hyperparathyroidism of renal origin: Secondary | ICD-10-CM | POA: Diagnosis not present

## 2017-12-02 DIAGNOSIS — D631 Anemia in chronic kidney disease: Secondary | ICD-10-CM | POA: Diagnosis not present

## 2017-12-03 ENCOUNTER — Encounter: Payer: Self-pay | Admitting: Podiatry

## 2017-12-03 ENCOUNTER — Ambulatory Visit (INDEPENDENT_AMBULATORY_CARE_PROVIDER_SITE_OTHER): Payer: Medicare Other | Admitting: Podiatry

## 2017-12-03 DIAGNOSIS — M79676 Pain in unspecified toe(s): Secondary | ICD-10-CM

## 2017-12-03 DIAGNOSIS — B351 Tinea unguium: Secondary | ICD-10-CM | POA: Diagnosis not present

## 2017-12-03 DIAGNOSIS — E1142 Type 2 diabetes mellitus with diabetic polyneuropathy: Secondary | ICD-10-CM

## 2017-12-03 NOTE — Progress Notes (Signed)
Patient ID: Ernest [name], male   DOB: July 13, 1946, 72 y.o.   MRN: [id number] Complaint:  Visit Type: Patient returns to my office for continued preventative foot care services. Complaint: Patient states" my nails have grown long and thick and become painful to walk and wear shoes" Patient has been diagnosed with DM with neuropathy. He presents for preventative foot care services. No changes to ROS  Podiatric Exam: Vascular: dorsalis pedis and posterior tibial pulses are palpable bilateral. Capillary return is immediate. Temperature gradient is WNL. Skin turgor WNL  Sensorium: Diminished n Semmes Weinstein monofilament test. Normal tactile sensation bilaterally. Nail Exam: Pt has thick disfigured discolored nails with subungual debris noted bilateral entire nail hallux through fifth toenails Ulcer Exam: There is no evidence of ulcer or pre-ulcerative changes or infection. Orthopedic Exam: Muscle tone and strength are WNL. No limitations in general ROM. No crepitus or effusions noted. Foot type and digits show no abnormalities. Hallux malleus noted. Mild HAV  B/L. Skin: No Porokeratosis. No infection or ulcers  Diagnosis:  Tinea unguium, Pain in right toe, pain in left toes  Treatment & Plan Procedures and Treatment: Consent by patient was obtained for treatment procedures. The patient understood the discussion of treatment and procedures well. All questions were answered thoroughly reviewed. Debridement of mycotic and hypertrophic toenails, 1 through 5 bilateral and clearing of subungual debris. No ulceration, no infection noted.  Return Visit-Office Procedure: Patient instructed to return to the office for a follow up visit 3 months for continued evaluation and treatment.   Gardiner Barefoot DPM

## 2017-12-04 DIAGNOSIS — N2581 Secondary hyperparathyroidism of renal origin: Secondary | ICD-10-CM | POA: Diagnosis not present

## 2017-12-04 DIAGNOSIS — N186 End stage renal disease: Secondary | ICD-10-CM | POA: Diagnosis not present

## 2017-12-04 DIAGNOSIS — D509 Iron deficiency anemia, unspecified: Secondary | ICD-10-CM | POA: Diagnosis not present

## 2017-12-04 DIAGNOSIS — E119 Type 2 diabetes mellitus without complications: Secondary | ICD-10-CM | POA: Diagnosis not present

## 2017-12-04 DIAGNOSIS — D631 Anemia in chronic kidney disease: Secondary | ICD-10-CM | POA: Diagnosis not present

## 2017-12-06 ENCOUNTER — Non-Acute Institutional Stay (SKILLED_NURSING_FACILITY): Payer: Medicare Other | Admitting: Adult Health

## 2017-12-06 ENCOUNTER — Encounter: Payer: Self-pay | Admitting: Adult Health

## 2017-12-06 DIAGNOSIS — R569 Unspecified convulsions: Secondary | ICD-10-CM

## 2017-12-06 DIAGNOSIS — N186 End stage renal disease: Secondary | ICD-10-CM

## 2017-12-06 DIAGNOSIS — I69398 Other sequelae of cerebral infarction: Secondary | ICD-10-CM | POA: Diagnosis not present

## 2017-12-06 DIAGNOSIS — E1122 Type 2 diabetes mellitus with diabetic chronic kidney disease: Secondary | ICD-10-CM | POA: Diagnosis not present

## 2017-12-06 DIAGNOSIS — I4892 Unspecified atrial flutter: Secondary | ICD-10-CM

## 2017-12-06 DIAGNOSIS — F339 Major depressive disorder, recurrent, unspecified: Secondary | ICD-10-CM

## 2017-12-06 DIAGNOSIS — Z794 Long term (current) use of insulin: Secondary | ICD-10-CM

## 2017-12-06 DIAGNOSIS — D631 Anemia in chronic kidney disease: Secondary | ICD-10-CM | POA: Diagnosis not present

## 2017-12-06 DIAGNOSIS — K5909 Other constipation: Secondary | ICD-10-CM | POA: Diagnosis not present

## 2017-12-06 DIAGNOSIS — I6930 Unspecified sequelae of cerebral infarction: Secondary | ICD-10-CM

## 2017-12-06 DIAGNOSIS — I10 Essential (primary) hypertension: Secondary | ICD-10-CM

## 2017-12-06 DIAGNOSIS — Z992 Dependence on renal dialysis: Secondary | ICD-10-CM | POA: Diagnosis not present

## 2017-12-06 DIAGNOSIS — B370 Candidal stomatitis: Secondary | ICD-10-CM | POA: Diagnosis not present

## 2017-12-06 NOTE — Progress Notes (Signed)
Location:  [facility] Room Number: 124 Place of Service:  SNF (31) Provider:  Durenda Age, NP 72-year-old  Patient Care Team: Patient, [name] as PCP - General (General Practice) Corliss Parish, MD as PCP - Internal Medicine (Nephrology) Jettie Booze, MD as PCP - Cardiology (Cardiology) Zadie Rhine Clent Demark, MD as Consulting Physician (Ophthalmology)  Extended Emergency Contact Information Primary Emergency Contact: Marylyn Ishihara Address: [address]          [place], [place] [address] Johnnette Litter of [place] Phone: [phone] Mobile Phone: [phone] Relation: Spouse Secondary Emergency Contact: Alean Rinne Address: [address], [place] [address] Johnnette Litter of [place] Phone: [phone] Mobile Phone: [phone] Relation: Daughter  Code Status:  Full Code  Goals of care: Advanced Directive information Advanced Directives 10/16/2017  Does Patient Have a Medical Advance Directive? No  Type of Advance Directive -  Does patient want to make changes to medical advance directive? -  Copy of [place] in Chart? -  Would patient like information on creating a medical advance directive? -  Pre-existing out of facility DNR order (yellow form or pink MOST form) -     Chief Complaint  Patient presents with  . Medical Management of Chronic Issues    Routine [facility] visit    HPI:  Pt is a 72 y.o. male seen today for medical management of chronic diseases.  He is a short-term rehabilitation resident of [facility].  He has a PMH of cerebral embolism with cerebral infarction, NSVT, ESRD on HD, TIA, OSA, HLD with type 2 diabetes mellitus, and CAD. He was seen in the room today. He refused dialysis today due to constipation issues. He had an enema and had a positive result. He said that he feels good now.    He was admitted to [facility]  rehabilitation on 10/08/17 from [facility] admission dates 09/07/17-10/07/17 for acute on chronic systolic and diastolic heart failure. He was having worsening SOB. He underwent cardiac catheterization on 09/09/17 and was found to have significant CAD. CABG 4 was done on 09/13/17. He, also, required mitral valve repair. Repeat scan was obtained and was stable from his baseline scan. Vascular surgery performed a fistulogram on 10/04/17 and revealed a significant stenosis  of the subclavian/innominate junction and angioplasty was performed. He was sent to the ED on 10/08/17 due to confusion and a fall. Imaging and labs did not reveal any significant findings.  Past Medical History:  Diagnosis Date  . Abnormal stress test    s/p cath November 2013 with modest disease involving the ostial left main, proximal LAD, proximal RCA - do not appear to be hemodynamically signficant; mild LV dysfunction  . Bacteremia   . Chronic systolic CHF (congestive heart failure) ([place])   . COPD (chronic obstructive pulmonary disease) ([place])   . Coronary artery disease    a. per cath report 2013, b. 09/13/2017-CABG X4 & Mitral valve repair  . Ejection fraction < 50%   . Erectile dysfunction    penile implant  . ESRD (end stage renal disease) ([place])    Started HD in [place] in 2009, ESRD was due to DM. Moved to [facility] in Dec 2009 and now gets dialysis at [facility] on a MWF schedule.     . Gout    Hx: of  . Hepatitis C   . Hyperlipidemia   .  Hypertension   . Mitral valve disease    s/p Mitral repair 09/13/17  . Obesity   . Retinopathy   . Shortness of breath    Hx: of with exertion  . Stroke ([place])   . Type II or unspecified type diabetes mellitus without mention of complication, not stated as uncontrolled    adult onset   Past Surgical History:  Procedure Laterality Date  . A/V FISTULAGRAM N/A 10/04/2017   Procedure: A/V FISTULAGRAM;  Surgeon: Elam Dutch, MD;  Location: [facility] CV LAB;   Service: Cardiovascular;  Laterality: N/A;  . AV FISTULA PLACEMENT Left 01/13/2013   Procedure: INSERTION OF ARTERIOVENOUS (AV) GORE-TEX GRAFT ARM;  Surgeon: Angelia Mould, MD;  Location: [facility];  Service: Vascular;  Laterality: Left;  . AV FISTULA PLACEMENT Left 05/05/2013   Procedure: INSERTION OF LEFT UPPER ARM  ARTERIOVENOUS GORTEX GRAFT;  Surgeon: Angelia Mould, MD;  Location: [facility];  Service: Vascular;  Laterality: Left;  . [place] REMOVAL Left 03/26/2013   Procedure: REMOVAL OF  NON INCORPORATED ARTERIOVENOUS GORETEX GRAFT ([place]) left arm * repair of  left brachial artery with vein patch angioplasty.;  Surgeon: Angelia Mould, MD;  Location: [place] [place];  Service: Vascular;  Laterality: Left;  . CARDIAC CATHETERIZATION  August 26, 2012  . CATARACT EXTRACTION W/ INTRAOCULAR LENS  IMPLANT, BILATERAL    . COLONOSCOPY    . CORONARY ARTERY BYPASS GRAFT N/A 09/13/2017   Procedure: CORONARY ARTERY BYPASS GRAFTING (CABG) times four LIMA  to LAD, SVG sequentially to OM and RAMUS Intermediate and SVG to Acute Marginal;  Surgeon: Melrose Nakayama, MD;  Location: [facility];  Service: Open Heart Surgery;  Laterality: N/A;  . EMBOLECTOMY Right 08/27/2013   Procedure: EMBOLECTOMY;  Surgeon: Mal Misty, MD;  Location: [facility];  Service: Vascular;  Laterality: Right;  Thrombectomy of Radial and ulnar artery.  [name] EYE SURGERY     laser.  and surgery for DM  . fistula     RUE and wrist  . INSERTION OF DIALYSIS CATHETER Right 01/01/2013   Procedure: INSERTION OF DIALYSIS CATHETER right  internal jugular;  Surgeon: Serafina Mitchell, MD;  Location: [place];  Service: Vascular;  Laterality: Right;  . INSERTION OF DIALYSIS CATHETER N/A 03/26/2013   Procedure: INSERTION OF DIALYSIS CATHETER Left internal jugular vein;  Surgeon: Angelia Mould, MD;  Location: [place];  Service: Vascular;  Laterality: N/A;  . LEFT HEART CATH AND CORONARY ANGIOGRAPHY N/A 09/09/2017   Procedure: LEFT HEART CATH AND  CORONARY ANGIOGRAPHY;  Surgeon: Troy Sine, MD;  Location: [place] CV LAB;  Service: Cardiovascular;  Laterality: N/A;  . LIGATION OF ARTERIOVENOUS  FISTULA Right 12/30/2013   Procedure: REMOVAL OF SEGMENT OF GORTEX GRAFT AND FISTULA  AND REPAIR OF BRACHIAL ARTERY;  Surgeon: Mal Misty, MD;  Location: [place];  Service: Vascular;  Laterality: Right;  . MITRAL VALVE REPAIR N/A 09/13/2017   Procedure: MITRAL VALVE REPAIR (MVR);  Surgeon: Melrose Nakayama, MD;  Location: [facility];  Service: Open Heart Surgery;  Laterality: N/A;  . MITRAL VALVE REPAIR (MV)/CORONARY ARTERY BYPASS GRAFTING (CABG)  09/13/2017  . PENILE PROSTHESIS IMPLANT     1997.. no card  . PERIPHERAL VASCULAR BALLOON ANGIOPLASTY Left 10/04/2017   Procedure: PERIPHERAL VASCULAR BALLOON ANGIOPLASTY;  Surgeon: Elam Dutch, MD;  Location: [place] CV LAB;  Service: Cardiovascular;  Laterality: Left;  AVF  . TEE WITHOUT CARDIOVERSION N/A 06/11/2013   Procedure: TRANSESOPHAGEAL  ECHOCARDIOGRAM (TEE);  Surgeon: Larey Dresser, MD;  Location: [facility] ENDOSCOPY;  Service: Cardiovascular;  Laterality: N/A;  . TEE WITHOUT CARDIOVERSION N/A 09/13/2017   Procedure: TRANSESOPHAGEAL ECHOCARDIOGRAM (TEE);  Surgeon: Melrose Nakayama, MD;  Location: [facility];  Service: Open Heart Surgery;  Laterality: N/A;  . THROMBECTOMY W/ EMBOLECTOMY Left 03/26/2013   Procedure: Attempted thrombectomy of left arm arteriovenous goretex graft.;  Surgeon: Angelia Mould, MD;  Location: [place];  Service: Vascular;  Laterality: Left;  [name] VENOGRAM N/A 04/07/2013   Procedure: VENOGRAM;  Surgeon: Serafina Mitchell, MD;  Location: [facility] CATH LAB;  Service: Cardiovascular;  Laterality: N/A;    [name]  Outpatient Encounter Medications as of 12/06/2017  Medication Sig  . amiodarone (PACERONE) 200 MG tablet Take 1 tablet (200 mg total) by mouth 2 (two) times daily.  [name] aspirin EC 81 MG tablet Take 1 tablet (81 mg total) by mouth daily.  [name]  atorvastatin (LIPITOR) 40 MG tablet Take 40 mg by mouth daily.   . B Complex-C-Folic Acid (NEPHRO-VITE PO) Take 1 tablet by mouth daily.  . calcium acetate (PHOSLO) 667 MG capsule Take 667 mg by mouth 3 (three) times daily with meals.   . cinacalcet (SENSIPAR) 30 MG tablet Take 4 tablets (120 mg total) by mouth daily with supper.  [name] doxercalciferol (HECTOROL) 4 MCG/2ML injection Inject 2 mcg into the vein every Monday, Wednesday, and Friday with hemodialysis. Administered at dialysis  . ferric citrate (AURYXIA) 1 GM 210 MG(Fe) tablet Take 210 mg by mouth 3 (three) times daily with meals.   . ferric gluconate 125 mg in sodium chloride 0.9 % 100 mL Inject 250 mg into the vein every Monday, Wednesday, and Friday with hemodialysis. Administered at dialysis  . insulin detemir (LEVEMIR) 100 UNIT/ML injection Inject 0.2 mLs (20 Units total) into the skin 2 (two) times daily.  [name] levETIRAcetam (KEPPRA) 500 MG tablet Take 2 tablets (1,000 mg total) by mouth daily. Then take extra 500 mg (1 tablet) on MWF after HD  . metoprolol tartrate (LOPRESSOR) 25 MG tablet Take 1 tablet (25 mg total) by mouth 2 (two) times daily.  . Nutritional Supplements (FEEDING SUPPLEMENT, NEPRO CARB STEADY,) LIQD Take 237 mLs by mouth 2 (two) times daily between meals.  . sennosides-docusate sodium (SENOKOT-S) 8.6-50 MG tablet Take 2 tablets by mouth 2 (two) times daily.  . sertraline (ZOLOFT) 25 MG tablet Take 25 mg by mouth at bedtime.  [name] warfarin (COUMADIN) 5 MG tablet Take 5 mg by mouth daily. Take as directed    No facility-administered encounter medications on file as of 12/06/2017.     Review of Systems  GENERAL: No change in appetite, no fatigue, no weight changes, no fever, chills or weakness MOUTH and THROAT: Denies oral discomfort, gingival pain or bleeding, pain from teeth or hoarseness   RESPIRATORY: no cough, SOB, DOE, wheezing, hemoptysis CARDIAC: No chest pain, edema or palpitations GI:  +constipation PSYCHIATRIC: Denies feelings of depression or anxiety. No report of hallucinations, insomnia, paranoia, or agitation   Immunization History  Administered Date(s) Administered  . Influenza,inj,Quad PF,6+ Mos 07/13/2015  . Influenza-Unspecified 07/15/2013  . Pneumococcal-Unspecified 07/15/2013   Pertinent  Health Maintenance Due  Topic Date Due  . FOOT EXAM  09/18/1958  . URINE MICROALBUMIN  09/18/1958  . PNA vac Low Risk Adult (1 of 2 - PCV13) 07/15/2014  . INFLUENZA VACCINE  05/15/2017  . OPHTHALMOLOGY EXAM  08/07/2017  . LIPID PANEL  10/28/2017  . HEMOGLOBIN A1C  12/09/2017  . COLONOSCOPY  06/01/2018   Fall Risk  11/26/2017 10/16/2017 07/18/2017 11/12/2016 02/07/2016  Falls in the past year? No Yes Yes Yes Yes  Number falls in past yr: - 2 or more 2 or more 2 or more 2 or more  Injury with Fall? - No - No Yes  Risk Factor Category  - - - - High Fall Risk  Risk for fall due to : - History of fall(s);Impaired balance/gait;Mental status change - - -  Risk for fall due to: Comment - - - - -  Follow up - Education provided Falls evaluation completed - Falls evaluation completed      Vitals:   12/06/17 1048  BP: 120/64  Pulse: 72  Resp: 19  Temp: 97.9 F (36.6 C)  TempSrc: Oral  SpO2: 98%  Weight: 205 lb 4.2 oz (93.1 kg)  Height: 5\' 9"  (1.753 m)   Body mass index is 30.31 kg/m.  Physical Exam  GENERAL APPEARANCE: Well nourished. In no acute distress. Normal body habitus SKIN:  Skin is warm and dry.  MOUTH and THROAT: Lips are without lesions. Oral mucosa is moist and without lesions. Tongue noted to have white coating RESPIRATORY: Breathing is even & unlabored, BS CTAB CARDIAC: Irregular heart rate, no murmur,no extra heart sounds, no edema, left upper arm AV Fistula +bruit/thrill GI: Abdomen soft, normal BS, no masses, no tenderness EXTREMITIES:  Able to move X 4 extremities, left-sided weakness PSYCHIATRIC: Alert to self, month and place and disoriented to  year. Affect and behavior are appropriate   Labs reviewed: Recent Labs    09/19/17 1945 09/20/17 0400 09/20/17 1628  10/02/17 0718 10/05/17 3748 10/05/17 0615 10/07/17 1100 10/08/17 0247 11/08/17  NA  --  134*  --    < > 133* 134*  --  136 134* 138  K  --  4.0  --    < > 4.3 4.2  --  4.5 4.3 3.6  CL  --  93*  --    < > 93* 95*  --  97* 95*  --   CO2  --  23  --    < > 25 27  --  28 26  --   GLUCOSE  --  165*  --    < > 122* 83  --  96 193*  --   BUN  --  20  --    < > 77* 57*  --  38* 23* 14  CREATININE  --  5.19*  --    < > 7.65* 8.27*  --  8.16* 5.85* 3.7*  CALCIUM  --  7.7*  --    < > 8.5* 8.4* 8.4* 8.3* 8.3*  --   MG 2.0 2.0 2.0  --   --   --   --   --   --   --   PHOS 3.6 4.1 2.6  --  5.8* 5.0*  --   --   --   --    < > = values in this interval not displayed.   Recent Labs    09/21/17 0320 09/23/17 0226 10/02/17 0718 10/05/17 0613 10/08/17 0247 11/08/17  AST 25 19  --   --  16 17  ALT 20 17  --   --  17 14  ALKPHOS 77 82  --   --  71 89  BILITOT 0.9 0.3  --   --  0.6  --   PROT 5.2* 5.5*  --   --  5.7*  --   ALBUMIN 2.2* 2.2* 2.2* 2.1* 2.5*  --    Recent Labs    09/08/17 0046  10/02/17 0730 10/05/17 0615 10/08/17 0247 11/08/17  WBC 11.7*   < > 10.6* 10.0 9.2 7.3  NEUTROABS 9.6*  --   --   --  6.5 4  HGB 11.6*   < > 8.3* 8.6* 10.0* 11.8*  HCT 34.8*   < > 24.8* 25.2* 29.7* 36*  MCV 89.7   < > 82.7 80.8 82.5  --   PLT 145*   < > 413* 401* 477* 244   < > = values in this interval not displayed.   Lab Results  Component Value Date   TSH 1.064 07/12/2015   Lab Results  Component Value Date   HGBA1C 9.1 (H) 09/08/2017   Lab Results  Component Value Date   CHOL 116 10/28/2016   HDL 48 10/28/2016   LDLCALC 50 10/28/2016   TRIG 92 10/28/2016   CHOLHDL 2.4 10/28/2016    Significant Diagnostic Results in last 30 days:  Dg Chest 2 View  Result Date: 11/11/2017 CLINICAL DATA:  72 year old male post CABG November 2018. Subsequent encounter. EXAM: CHEST   2 VIEW COMPARISON:  10/08/2017 chest x-ray. FINDINGS: Post CABG mitral valve replacement.  Cardiomegaly. Mild pulmonary vascular prominence most notable centrally. Decrease in size of left-sided pleural effusion and left base atelectasis. No acute osseous abnormality. Probable subclavian stent projects over lateral aspect right upper lobe. IMPRESSION: Post CABG and mitral valve replacement.  Cardiomegaly. Mild pulmonary vascular prominence most notable centrally (slightly improved from prior exam). Decrease in size of left-sided pleural effusion and left base atelectasis. Electronically Signed   By: Genia Del M.D.   On: 11/11/2017 15:50    Assessment/Plan  1. Oral candida - will start Nystatin 100,000 units/ml swab 5 ml to tongue QID X 2 weeks, oral care daily    2. Type 2 diabetes mellitus with chronic kidney disease on chronic dialysis, with long-term current use of insulin (HCC) - continue Levemir 100 units/ml inject 20 units SQ BID Lab Results  Component Value Date   HGBA1C 9.1 (H) 09/08/2017     3. Seizure, late effect of stroke ([place]) - no recent seizures, continue Keppra 1000 mg 1 tab every morning and 500 mg 1 tab every 2 p.m. on MWF    4. End-stage renal December is 1 on hemodialysis [facility]) - continue hemodialysis every MWF, Sensipar 30 mg give 4 tabs = 120 mg daily, Calcium acetate 667 mg TID     5. Paroxysmal atrial flutter (HCC) - rate controlled, continue metoprolol tartrate 50 mg give 1/2 tab = 25 mg daily, amiodarone 200 mg 1 tab twice a day, Coumadin 5 mg every Tuesday and Thursday, will check INR   6. Essential hypertension - well-controlled, continue metoprolol tartrate 50 mg give 1/2 tab = 25 mg daily   7. History of CVA with residual deficit - stable, continue metoprolol tartrate 50 mg give 1/2 tab = 25 mg daily, Coumadin 5 mg 1 tab by mouth every Tuesday and Thursdays, atorvastatin 40 mg 1 tab daily, last INR was done on 11/25/17 INR 1.6   8. Anemia due to  chronic kidney disease, on chronic dialysis ([place]) - continue Auryxia 210 mg give 2 tabs = 410 mg TID, Darbepoetin 100 mcg/0.2ml injection every Mondays at dialysis, ferric gluconate 250 mg injection every MWF at hemodialysis Lab Results  Component Value Date   HGB 11.8 (A) 11/08/2017  9. Recurrent depression - mood is stable, continue sertraline 25 mg 1 tab daily at bedtime  10. Chronic constipation - start Linzess 145 g 1 capsule daily        Family/ staff Communication:  Discussed plan of care with patient and charge nurse.  Labs/tests ordered:  Stat INR  Goals of care:   Short-term care   Durenda Age, NP [facility] and Adult Medicine [phone] (Monday-Friday 8:00 a.m. - 5:00 p.m.) [phone] (after hours)

## 2017-12-09 ENCOUNTER — Encounter: Payer: Self-pay | Admitting: Adult Health

## 2017-12-09 ENCOUNTER — Non-Acute Institutional Stay (SKILLED_NURSING_FACILITY): Payer: Medicare Other | Admitting: Adult Health

## 2017-12-09 DIAGNOSIS — Z794 Long term (current) use of insulin: Secondary | ICD-10-CM | POA: Diagnosis not present

## 2017-12-09 DIAGNOSIS — I4892 Unspecified atrial flutter: Secondary | ICD-10-CM

## 2017-12-09 DIAGNOSIS — Z992 Dependence on renal dialysis: Secondary | ICD-10-CM | POA: Diagnosis not present

## 2017-12-09 DIAGNOSIS — N186 End stage renal disease: Secondary | ICD-10-CM | POA: Diagnosis not present

## 2017-12-09 DIAGNOSIS — R1084 Generalized abdominal pain: Secondary | ICD-10-CM | POA: Diagnosis not present

## 2017-12-09 DIAGNOSIS — E119 Type 2 diabetes mellitus without complications: Secondary | ICD-10-CM | POA: Diagnosis not present

## 2017-12-09 DIAGNOSIS — D631 Anemia in chronic kidney disease: Secondary | ICD-10-CM | POA: Diagnosis not present

## 2017-12-09 DIAGNOSIS — N2581 Secondary hyperparathyroidism of renal origin: Secondary | ICD-10-CM | POA: Diagnosis not present

## 2017-12-09 DIAGNOSIS — E1122 Type 2 diabetes mellitus with diabetic chronic kidney disease: Secondary | ICD-10-CM

## 2017-12-09 DIAGNOSIS — Z7901 Long term (current) use of anticoagulants: Secondary | ICD-10-CM | POA: Diagnosis not present

## 2017-12-09 DIAGNOSIS — D509 Iron deficiency anemia, unspecified: Secondary | ICD-10-CM | POA: Diagnosis not present

## 2017-12-09 NOTE — Progress Notes (Signed)
Location:  [place] Room Number: 124-A Place of Service:  SNF (31) Provider:  Durenda Age, NP  Patient Care Team: Patient, [name] as PCP - General (General Practice) Corliss Parish, MD as PCP - Internal Medicine (Nephrology) Jettie Booze, MD as PCP - Cardiology (Cardiology) Zadie Rhine Clent Demark, MD as Consulting Physician (Ophthalmology)  Extended Emergency Contact Information Primary Emergency Contact: Marylyn Ishihara Address: [address]          [place], [place] [address] Johnnette Litter of [place] Phone: [phone] Mobile Phone: [phone] Relation: Spouse Secondary Emergency Contact: Alean Rinne Address: [address], [place] [address] Johnnette Litter of [place] Phone: [phone] Mobile Phone: [phone] Relation: Daughter  Code Status:  Full Code  Goals of care: Advanced Directive information Advanced Directives 10/16/2017  Does Patient Have a Medical Advance Directive? No  Type of Advance Directive -  Does patient want to make changes to medical advance directive? -  Copy of [place] in Chart? -  Would patient like information on creating a medical advance directive? -  Pre-existing out of facility DNR order (yellow form or pink MOST form) -     Chief Complaint  Patient presents with  . Acute Visit    Complains of abdominal pain and staff documenting decreased appetite and low blood sugars.    HPI:  Pt is a 72 y.o. male seen today for an acute visit due to complaints of abdominal pain, low blood sugar, and decreased appetite.  He is a long-term care resident of [facility].  He has a PMH of cerebral embolism with cerebral infarction, NSVT, ESRD on hemodialysis, TIA, obstructive sleep apnea, HLD with type 2 DM, and CAD.  He was seen in the room today. He had episodes of hypoglycemia - 39, 42, 50. He complained of abdominal pain yesterday and  had poor appetite. He gets Levemir BID. He is supposed to have PT/INR checked today but phlebotomist was unsuccessful in blood extraction X 2.   Past Medical History:  Diagnosis Date  . Abnormal stress test    s/p cath November 2013 with modest disease involving the ostial left main, proximal LAD, proximal RCA - do not appear to be hemodynamically signficant; mild LV dysfunction  . Bacteremia   . Chronic systolic CHF (congestive heart failure) ([place])   . COPD (chronic obstructive pulmonary disease) ([place])   . Coronary artery disease    a. per cath report 2013, b. 09/13/2017-CABG X4 & Mitral valve repair  . Ejection fraction < 50%   . Erectile dysfunction    penile implant  . ESRD (end stage renal disease) ([place])    Started HD in [place] in 2009, ESRD was due to DM. Moved to [facility] in Dec 2009 and now gets dialysis at [facility] on a MWF schedule.     . Gout    Hx: of  . Hepatitis C   . Hyperlipidemia   . Hypertension   . Mitral valve disease    s/p Mitral repair 09/13/17  . Obesity   . Retinopathy   . Shortness of breath    Hx: of with exertion  . Stroke ([place])   . Type II or unspecified type diabetes mellitus without mention of complication, not stated as uncontrolled    adult onset   Past Surgical History:  Procedure Laterality Date  . A/V FISTULAGRAM N/A 10/04/2017   Procedure: A/V  FISTULAGRAM;  Surgeon: Elam Dutch, MD;  Location: [place] CV LAB;  Service: Cardiovascular;  Laterality: N/A;  . AV FISTULA PLACEMENT Left 01/13/2013   Procedure: INSERTION OF ARTERIOVENOUS (AV) GORE-TEX GRAFT ARM;  Surgeon: Angelia Mould, MD;  Location: [place];  Service: Vascular;  Laterality: Left;  . AV FISTULA PLACEMENT Left 05/05/2013   Procedure: INSERTION OF LEFT UPPER ARM  ARTERIOVENOUS GORTEX GRAFT;  Surgeon: Angelia Mould, MD;  Location: [place];  Service: Vascular;  Laterality: Left;  . [place] REMOVAL Left 03/26/2013   Procedure: REMOVAL OF  NON INCORPORATED ARTERIOVENOUS  GORETEX GRAFT ([place]) left arm * repair of  left brachial artery with vein patch angioplasty.;  Surgeon: Angelia Mould, MD;  Location: [place];  Service: Vascular;  Laterality: Left;  . CARDIAC CATHETERIZATION  August 26, 2012  . CATARACT EXTRACTION W/ INTRAOCULAR LENS  IMPLANT, BILATERAL    . COLONOSCOPY    . CORONARY ARTERY BYPASS GRAFT N/A 09/13/2017   Procedure: CORONARY ARTERY BYPASS GRAFTING (CABG) times four LIMA  to LAD, SVG sequentially to OM and RAMUS Intermediate and SVG to Acute Marginal;  Surgeon: Melrose Nakayama, MD;  Location: [place];  Service: Open Heart Surgery;  Laterality: N/A;  . EMBOLECTOMY Right 08/27/2013   Procedure: EMBOLECTOMY;  Surgeon: Mal Misty, MD;  Location: [place];  Service: Vascular;  Laterality: Right;  Thrombectomy of Radial and ulnar artery.  [name] EYE SURGERY     laser.  and surgery for DM  . fistula     RUE and wrist  . INSERTION OF DIALYSIS CATHETER Right 01/01/2013   Procedure: INSERTION OF DIALYSIS CATHETER right  internal jugular;  Surgeon: Serafina Mitchell, MD;  Location: [place];  Service: Vascular;  Laterality: Right;  . INSERTION OF DIALYSIS CATHETER N/A 03/26/2013   Procedure: INSERTION OF DIALYSIS CATHETER Left internal jugular vein;  Surgeon: Angelia Mould, MD;  Location: [place];  Service: Vascular;  Laterality: N/A;  . LEFT HEART CATH AND CORONARY ANGIOGRAPHY N/A 09/09/2017   Procedure: LEFT HEART CATH AND CORONARY ANGIOGRAPHY;  Surgeon: Troy Sine, MD;  Location: [place] CV LAB;  Service: Cardiovascular;  Laterality: N/A;  . LIGATION OF ARTERIOVENOUS  FISTULA Right 12/30/2013   Procedure: REMOVAL OF SEGMENT OF GORTEX GRAFT AND FISTULA  AND REPAIR OF BRACHIAL ARTERY;  Surgeon: Mal Misty, MD;  Location: [place];  Service: Vascular;  Laterality: Right;  . MITRAL VALVE REPAIR N/A 09/13/2017   Procedure: MITRAL VALVE REPAIR (MVR);  Surgeon: Melrose Nakayama, MD;  Location: [place];  Service: Open Heart Surgery;   Laterality: N/A;  . MITRAL VALVE REPAIR (MV)/CORONARY ARTERY BYPASS GRAFTING (CABG)  09/13/2017  . PENILE PROSTHESIS IMPLANT     1997.. no card  . PERIPHERAL VASCULAR BALLOON ANGIOPLASTY Left 10/04/2017   Procedure: PERIPHERAL VASCULAR BALLOON ANGIOPLASTY;  Surgeon: Elam Dutch, MD;  Location: [place] CV LAB;  Service: Cardiovascular;  Laterality: Left;  AVF  . TEE WITHOUT CARDIOVERSION N/A 06/11/2013   Procedure: TRANSESOPHAGEAL ECHOCARDIOGRAM (TEE);  Surgeon: Larey Dresser, MD;  Location: [facility] ENDOSCOPY;  Service: Cardiovascular;  Laterality: N/A;  . TEE WITHOUT CARDIOVERSION N/A 09/13/2017   Procedure: TRANSESOPHAGEAL ECHOCARDIOGRAM (TEE);  Surgeon: Melrose Nakayama, MD;  Location: [name];  Service: Open Heart Surgery;  Laterality: N/A;  . THROMBECTOMY W/ EMBOLECTOMY Left 03/26/2013   Procedure: Attempted thrombectomy of left arm arteriovenous goretex graft.;  Surgeon: Angelia Mould, MD;  Location: [place];  Service: Vascular;  Laterality: Left;  [name] VENOGRAM N/A 04/07/2013   Procedure: VENOGRAM;  Surgeon: Serafina Mitchell, MD;  Location: [facility] CATH LAB;  Service: Cardiovascular;  Laterality: N/A;    [name]  Outpatient Encounter Medications as of 12/09/2017  Medication Sig  . amiodarone (PACERONE) 200 MG tablet Take 1 tablet (200 mg total) by mouth 2 (two) times daily.  [name] aspirin EC 81 MG tablet Take 1 tablet (81 mg total) by mouth daily.  [name] atorvastatin (LIPITOR) 40 MG tablet Take 40 mg by mouth daily.   . B Complex-C-Folic Acid (NEPHRO-VITE PO) Take 1 tablet by mouth daily.  . calcium acetate (PHOSLO) 667 MG capsule Take 667 mg by mouth 3 (three) times daily with meals.   . cinacalcet (SENSIPAR) 30 MG tablet Take 4 tablets (120 mg total) by mouth daily with supper.  . Darbepoetin Alfa (ARANESP) 100 MCG/0.5ML SOSY injection Inject 100 mcg into the skin every 7 (seven) days. Administered with dialysis  . doxercalciferol (HECTOROL) 4 MCG/2ML injection Inject 2 mcg  into the vein every Monday, Wednesday, and Friday with hemodialysis. Administered at dialysis  . ferric citrate (AURYXIA) 1 GM 210 MG(Fe) tablet Take 420 mg by mouth 3 (three) times daily with meals.   . ferric gluconate 125 mg in sodium chloride 0.9 % 100 mL Inject 250 mg into the vein every Monday, Wednesday, and Friday with hemodialysis.   [name] insulin detemir (LEVEMIR) 100 UNIT/ML injection Inject 0.2 mLs (20 Units total) into the skin 2 (two) times daily.  [name] levETIRAcetam (KEPPRA) 500 MG tablet Take 2 tablets (1,000 mg total) by mouth daily. Then take extra 500 mg (1 tablet) on MWF after HD  . linaclotide (LINZESS) 145 MCG CAPS capsule Take 145 mcg by mouth daily.  . metoprolol tartrate (LOPRESSOR) 50 MG tablet Take 50 mg by mouth daily. Take one-half tablet qd  . Nutritional Supplements (FEEDING SUPPLEMENT, NEPRO CARB STEADY,) LIQD Take 237 mLs by mouth 2 (two) times daily between meals.  . sennosides-docusate sodium (SENOKOT-S) 8.6-50 MG tablet Take 2 tablets by mouth 2 (two) times daily.  . sertraline (ZOLOFT) 25 MG tablet Take 25 mg by mouth at bedtime.  [name] warfarin (COUMADIN) 4 MG tablet Take 4 mg by mouth daily.  . [DISCONTINUED] metoprolol tartrate (LOPRESSOR) 25 MG tablet Take 1 tablet (25 mg total) by mouth 2 (two) times daily.  . [DISCONTINUED] warfarin (COUMADIN) 5 MG tablet Take 5 mg by mouth daily. Take as directed    No facility-administered encounter medications on file as of 12/09/2017.     Review of Systems  GENERAL: No change in no fatigue, no weight changes, no fever, chills or weakness MOUTH and THROAT: Denies oral discomfort, gingival pain or bleeding RESPIRATORY: no cough, SOB, DOE, wheezing, hemoptysis CARDIAC: No chest pain, edema or palpitations GI: +abdominal pain PSYCHIATRIC: Denies feelings of depression or anxiety. No report of hallucinations, insomnia, paranoia, or agitation   Immunization History  Administered Date(s) Administered  . Influenza,inj,Quad  PF,6+ Mos 07/13/2015  . Influenza-Unspecified 07/15/2013  . Pneumococcal-Unspecified 07/15/2013   Pertinent  Health Maintenance Due  Topic Date Due  . FOOT EXAM  09/18/1958  . URINE MICROALBUMIN  09/18/1958  . PNA vac Low Risk Adult (1 of 2 - PCV13) 07/15/2014  . INFLUENZA VACCINE  05/15/2017  . OPHTHALMOLOGY EXAM  08/07/2017  . LIPID PANEL  10/28/2017  . HEMOGLOBIN A1C  12/09/2017  . COLONOSCOPY  06/01/2018   Fall Risk  11/26/2017 10/16/2017 07/18/2017 11/12/2016 02/07/2016  Falls in the  past year? No Yes Yes Yes Yes  Number falls in past yr: - 2 or more 2 or more 2 or more 2 or more  Injury with Fall? - No - No Yes  Risk Factor Category  - - - - High Fall Risk  Risk for fall due to : - History of fall(s);Impaired balance/gait;Mental status change - - -  Risk for fall due to: Comment - - - - -  Follow up - Education provided Falls evaluation completed - Falls evaluation completed      Vitals:   12/09/17 1552  BP: 133/78  Pulse: 90  Resp: 20  Temp: (!) 96.4 F (35.8 C)  TempSrc: Oral  SpO2: 98%  Weight: 205 lb 3.2 oz (93.1 kg)  Height: 5\' 10"  (1.778 m)   Body mass index is 29.44 kg/m.  Physical Exam  GENERAL APPEARANCE: Well nourished. In no acute distress.  SKIN:  Skin is warm and dry.  MOUTH and THROAT: Lips are without lesions. Oral mucosa is moist and without lesions. Tongue with whitish coating RESPIRATORY: Breathing is even & unlabored, BS CTAB CARDIAC: RRR, no murmur,no extra heart sounds, left upper arm with AV fistula +bruit/thrill GI: Abdomen soft, normal BS, no masses EXTREMITIES:  Able to move X 4 extremities NEURO:  Slurred speech PSYCHIATRIC: Alert to self and place, disoriented to time. Affect and behavior are appropriate   Labs reviewed: Recent Labs    09/19/17 1945 09/20/17 0400 09/20/17 1628  10/02/17 0718 10/05/17 1751 10/05/17 0615 10/07/17 1100 10/08/17 0247 11/08/17  NA  --  134*  --    < > 133* 134*  --  136 134* 138  K  --  4.0  --     < > 4.3 4.2  --  4.5 4.3 3.6  CL  --  93*  --    < > 93* 95*  --  97* 95*  --   CO2  --  23  --    < > 25 27  --  28 26  --   GLUCOSE  --  165*  --    < > 122* 83  --  96 193*  --   BUN  --  20  --    < > 77* 57*  --  38* 23* 14  CREATININE  --  5.19*  --    < > 7.65* 8.27*  --  8.16* 5.85* 3.7*  CALCIUM  --  7.7*  --    < > 8.5* 8.4* 8.4* 8.3* 8.3*  --   MG 2.0 2.0 2.0  --   --   --   --   --   --   --   PHOS 3.6 4.1 2.6  --  5.8* 5.0*  --   --   --   --    < > = values in this interval not displayed.   Recent Labs    09/21/17 0320 09/23/17 0226 10/02/17 0718 10/05/17 0613 10/08/17 0247 11/08/17  AST 25 19  --   --  16 17  ALT 20 17  --   --  17 14  ALKPHOS 77 82  --   --  71 89  BILITOT 0.9 0.3  --   --  0.6  --   PROT 5.2* 5.5*  --   --  5.7*  --   ALBUMIN 2.2* 2.2* 2.2* 2.1* 2.5*  --    Recent Labs    09/08/17 0046  10/02/17 0730 10/05/17 0615 10/08/17 0247 11/08/17  WBC 11.7*   < > 10.6* 10.0 9.2 7.3  NEUTROABS 9.6*  --   --   --  6.5 4  HGB 11.6*   < > 8.3* 8.6* 10.0* 11.8*  HCT 34.8*   < > 24.8* 25.2* 29.7* 36*  MCV 89.7   < > 82.7 80.8 82.5  --   PLT 145*   < > 413* 401* 477* 244   < > = values in this interval not displayed.   Lab Results  Component Value Date   TSH 1.064 07/12/2015   Lab Results  Component Value Date   HGBA1C 9.1 (H) 09/08/2017   Lab Results  Component Value Date   CHOL 116 10/28/2016   HDL 48 10/28/2016   LDLCALC 50 10/28/2016   TRIG 92 10/28/2016   CHOLHDL 2.4 10/28/2016    Significant Diagnostic Results in last 30 days:  Dg Chest 2 View  Result Date: 11/11/2017 CLINICAL DATA:  72 year old male post CABG November 2018. Subsequent encounter. EXAM: CHEST  2 VIEW COMPARISON:  10/08/2017 chest x-ray. FINDINGS: Post CABG mitral valve replacement.  Cardiomegaly. Mild pulmonary vascular prominence most notable centrally. Decrease in size of left-sided pleural effusion and left base atelectasis. No acute osseous abnormality. Probable  subclavian stent projects over lateral aspect right upper lobe. IMPRESSION: Post CABG and mitral valve replacement.  Cardiomegaly. Mild pulmonary vascular prominence most notable centrally (slightly improved from prior exam). Decrease in size of left-sided pleural effusion and left base atelectasis. Electronically Signed   By: Genia Del M.D.   On: 11/11/2017 15:50    Assessment/Plan  1. Type 2 diabetes mellitus with chronic kidney disease on chronic dialysis, with long-term current use of insulin (HCC) - will decrease Levemir 100 units/ml from 20 units to 16 units SQ BID, CBG BID   2. Paroxysmal atrial flutter (HCC) - rate-controlled, continue Metoprolol tartrate 50 mg 1/2 tab = 25 mg daily, Amiodarone 200 mg BID, and Coumadin 4 mg daily   3. Long term current use of anticoagulant - check PT/INR   4. Generalized abdominal pain - will get KUB       Durenda Age, NP [facility] [phone] (Monday-Friday 8:00 a.m. - 5:00 p.m.) [phone] (after hours)\]

## 2017-12-11 DIAGNOSIS — D631 Anemia in chronic kidney disease: Secondary | ICD-10-CM | POA: Diagnosis not present

## 2017-12-11 DIAGNOSIS — D509 Iron deficiency anemia, unspecified: Secondary | ICD-10-CM | POA: Diagnosis not present

## 2017-12-11 DIAGNOSIS — E119 Type 2 diabetes mellitus without complications: Secondary | ICD-10-CM | POA: Diagnosis not present

## 2017-12-11 DIAGNOSIS — N2581 Secondary hyperparathyroidism of renal origin: Secondary | ICD-10-CM | POA: Diagnosis not present

## 2017-12-11 DIAGNOSIS — N186 End stage renal disease: Secondary | ICD-10-CM | POA: Diagnosis not present

## 2017-12-11 LAB — CBC AND DIFFERENTIAL
HEMATOCRIT: 36 — AB (ref 41–53)
HEMOGLOBIN: 11.9 — AB (ref 13.5–17.5)
NEUTROS ABS: 4
Platelets: 256 (ref 150–399)
WBC: 6.4

## 2017-12-11 LAB — PROTIME-INR: PROTIME: 21.5 — AB (ref 10.0–13.8)

## 2017-12-11 LAB — BASIC METABOLIC PANEL
BUN: 16 (ref 4–21)
CREATININE: 4.2 — AB (ref 0.6–1.3)
Glucose: 49
POTASSIUM: 3.2 — AB (ref 3.4–5.3)
Sodium: 140 (ref 137–147)

## 2017-12-11 LAB — POCT INR: INR: 1.9 — AB (ref 0.9–1.1)

## 2017-12-11 LAB — HEMOGLOBIN A1C: HEMOGLOBIN A1C: 6.2

## 2017-12-13 DIAGNOSIS — E1129 Type 2 diabetes mellitus with other diabetic kidney complication: Secondary | ICD-10-CM | POA: Diagnosis not present

## 2017-12-13 DIAGNOSIS — Z992 Dependence on renal dialysis: Secondary | ICD-10-CM | POA: Diagnosis not present

## 2017-12-13 DIAGNOSIS — N186 End stage renal disease: Secondary | ICD-10-CM | POA: Diagnosis not present

## 2017-12-13 DIAGNOSIS — N2581 Secondary hyperparathyroidism of renal origin: Secondary | ICD-10-CM | POA: Diagnosis not present

## 2017-12-13 DIAGNOSIS — D509 Iron deficiency anemia, unspecified: Secondary | ICD-10-CM | POA: Diagnosis not present

## 2017-12-13 DIAGNOSIS — D631 Anemia in chronic kidney disease: Secondary | ICD-10-CM | POA: Diagnosis not present

## 2017-12-13 DIAGNOSIS — E119 Type 2 diabetes mellitus without complications: Secondary | ICD-10-CM | POA: Diagnosis not present

## 2017-12-16 DIAGNOSIS — N2581 Secondary hyperparathyroidism of renal origin: Secondary | ICD-10-CM | POA: Diagnosis not present

## 2017-12-16 DIAGNOSIS — D509 Iron deficiency anemia, unspecified: Secondary | ICD-10-CM | POA: Diagnosis not present

## 2017-12-16 DIAGNOSIS — E119 Type 2 diabetes mellitus without complications: Secondary | ICD-10-CM | POA: Diagnosis not present

## 2017-12-16 DIAGNOSIS — D631 Anemia in chronic kidney disease: Secondary | ICD-10-CM | POA: Diagnosis not present

## 2017-12-16 DIAGNOSIS — N186 End stage renal disease: Secondary | ICD-10-CM | POA: Diagnosis not present

## 2017-12-18 ENCOUNTER — Other Ambulatory Visit: Payer: Self-pay

## 2017-12-18 ENCOUNTER — Emergency Department (HOSPITAL_COMMUNITY)
Admission: EM | Admit: 2017-12-18 | Discharge: 2017-12-18 | Disposition: A | Discharge status: Home / Self Care | Payer: Medicare Other | Attending: Emergency Medicine | Admitting: Emergency Medicine

## 2017-12-18 DIAGNOSIS — Z87891 Personal history of nicotine dependence: Secondary | ICD-10-CM | POA: Insufficient documentation

## 2017-12-18 DIAGNOSIS — I2510 Atherosclerotic heart disease of native coronary artery without angina pectoris: Secondary | ICD-10-CM | POA: Diagnosis not present

## 2017-12-18 DIAGNOSIS — Z7982 Long term (current) use of aspirin: Secondary | ICD-10-CM | POA: Insufficient documentation

## 2017-12-18 DIAGNOSIS — T82838A Hemorrhage of vascular prosthetic devices, implants and grafts, initial encounter: Secondary | ICD-10-CM

## 2017-12-18 DIAGNOSIS — E1140 Type 2 diabetes mellitus with diabetic neuropathy, unspecified: Secondary | ICD-10-CM | POA: Insufficient documentation

## 2017-12-18 DIAGNOSIS — N2581 Secondary hyperparathyroidism of renal origin: Secondary | ICD-10-CM | POA: Diagnosis not present

## 2017-12-18 DIAGNOSIS — E119 Type 2 diabetes mellitus without complications: Secondary | ICD-10-CM | POA: Diagnosis not present

## 2017-12-18 DIAGNOSIS — Z79899 Other long term (current) drug therapy: Secondary | ICD-10-CM | POA: Insufficient documentation

## 2017-12-18 DIAGNOSIS — N186 End stage renal disease: Secondary | ICD-10-CM | POA: Insufficient documentation

## 2017-12-18 DIAGNOSIS — Z951 Presence of aortocoronary bypass graft: Secondary | ICD-10-CM | POA: Insufficient documentation

## 2017-12-18 DIAGNOSIS — D509 Iron deficiency anemia, unspecified: Secondary | ICD-10-CM | POA: Diagnosis not present

## 2017-12-18 DIAGNOSIS — I132 Hypertensive heart and chronic kidney disease with heart failure and with stage 5 chronic kidney disease, or end stage renal disease: Secondary | ICD-10-CM | POA: Insufficient documentation

## 2017-12-18 DIAGNOSIS — F0150 Vascular dementia without behavioral disturbance: Secondary | ICD-10-CM | POA: Insufficient documentation

## 2017-12-18 DIAGNOSIS — I5042 Chronic combined systolic (congestive) and diastolic (congestive) heart failure: Secondary | ICD-10-CM | POA: Diagnosis not present

## 2017-12-18 DIAGNOSIS — Z7901 Long term (current) use of anticoagulants: Secondary | ICD-10-CM | POA: Insufficient documentation

## 2017-12-18 DIAGNOSIS — D631 Anemia in chronic kidney disease: Secondary | ICD-10-CM | POA: Diagnosis not present

## 2017-12-18 DIAGNOSIS — Z992 Dependence on renal dialysis: Secondary | ICD-10-CM | POA: Insufficient documentation

## 2017-12-18 DIAGNOSIS — E1122 Type 2 diabetes mellitus with diabetic chronic kidney disease: Secondary | ICD-10-CM | POA: Insufficient documentation

## 2017-12-18 DIAGNOSIS — J449 Chronic obstructive pulmonary disease, unspecified: Secondary | ICD-10-CM | POA: Insufficient documentation

## 2017-12-18 DIAGNOSIS — Y69 Unspecified misadventure during surgical and medical care: Secondary | ICD-10-CM | POA: Insufficient documentation

## 2017-12-18 LAB — CBC WITH DIFFERENTIAL/PLATELET
BASOS ABS: 0 10*3/uL (ref 0.0–0.1)
Basophils Relative: 0 %
EOS PCT: 3 %
Eosinophils Absolute: 0.2 10*3/uL (ref 0.0–0.7)
HEMATOCRIT: 34.2 % — AB (ref 39.0–52.0)
Hemoglobin: 11.3 g/dL — ABNORMAL LOW (ref 13.0–17.0)
LYMPHS ABS: 1.9 10*3/uL (ref 0.7–4.0)
LYMPHS PCT: 22 %
MCH: 29 pg (ref 26.0–34.0)
MCHC: 33 g/dL (ref 30.0–36.0)
MCV: 87.7 fL (ref 78.0–100.0)
MONO ABS: 0.8 10*3/uL (ref 0.1–1.0)
Monocytes Relative: 9 %
NEUTROS ABS: 5.6 10*3/uL (ref 1.7–7.7)
Neutrophils Relative %: 66 %
Platelets: 227 10*3/uL (ref 150–400)
RBC: 3.9 MIL/uL — AB (ref 4.22–5.81)
RDW: 18.2 % — ABNORMAL HIGH (ref 11.5–15.5)
WBC: 8.5 10*3/uL (ref 4.0–10.5)

## 2017-12-18 LAB — BASIC METABOLIC PANEL
ANION GAP: 12 (ref 5–15)
BUN: 17 mg/dL (ref 6–20)
CO2: 31 mmol/L (ref 22–32)
Calcium: 8.3 mg/dL — ABNORMAL LOW (ref 8.9–10.3)
Chloride: 96 mmol/L — ABNORMAL LOW (ref 101–111)
Creatinine, Ser: 5.21 mg/dL — ABNORMAL HIGH (ref 0.61–1.24)
GFR calc Af Amer: 12 mL/min — ABNORMAL LOW (ref >60)
GFR calc non Af Amer: 10 mL/min — ABNORMAL LOW (ref >60)
GLUCOSE: 106 mg/dL — AB (ref 65–99)
POTASSIUM: 4.6 mmol/L (ref 3.5–5.1)
Sodium: 139 mmol/L (ref 135–145)

## 2017-12-18 LAB — PROTIME-INR
INR: 2.82
Prothrombin Time: 29.5 seconds — ABNORMAL HIGH (ref 11.4–15.2)

## 2017-12-18 NOTE — Discharge Instructions (Signed)
Go to dialysis as your normal schedule.  Keep bandage on to help with pressure.  If bleeding returns, return to the emergency department.  Return the emergency department for any worsening bleeding, pain or any other worsening or concerning symptoms.

## 2017-12-18 NOTE — ED Provider Notes (Signed)
[facility] EMERGENCY DEPARTMENT Provider Note   CSN: [id number] Arrival date & time: 12/18/17  1137     History   Chief Complaint Chief Complaint  Patient presents with  . Vascular Access Problem    HPI Ernest [name] is a 72 y.o. male with past medical history of COPD, CHF, CKD presents for evaluation of vascular access problem.  Patient is a Monday Wednesday Friday dialysis patient.  He was at dialysis this morning and had an hour left remaining when his left-sided fistula began bleeding.  The nurses at dialysis attempted to apply pressure to the arm for 1 hour and patient continued to have bleeding, up to ED visit.  Patient is currently on Coumadin and aspirin.  Patient denies any pain to the area.  Patient denies any numbness/weakness.  The history is provided by the patient.    Past Medical History:  Diagnosis Date  . Abnormal stress test    s/p cath November 2013 with modest disease involving the ostial left main, proximal LAD, proximal RCA - do not appear to be hemodynamically signficant; mild LV dysfunction  . Bacteremia   . Chronic systolic CHF (congestive heart failure) ([place])   . COPD (chronic obstructive pulmonary disease) ([place])   . Coronary artery disease    a. per cath report 2013, b. 09/13/2017-CABG X4 & Mitral valve repair  . Ejection fraction < 50%   . Erectile dysfunction    penile implant  . ESRD (end stage renal disease) ([place])    Started HD in [place] in 2009, ESRD was due to DM. Moved to [facility] in Dec 2009 and now gets dialysis at [facility] on a MWF schedule.     . Gout    Hx: of  . Hepatitis C   . Hyperlipidemia   . Hypertension   . Mitral valve disease    s/p Mitral repair 09/13/17  . Obesity   . Retinopathy   . Shortness of breath    Hx: of with exertion  . Stroke ([place])   . Type II or unspecified type diabetes mellitus without mention of complication, not stated as uncontrolled    adult onset    Patient Active Problem List   Diagnosis Date Noted  . Vascular dementia 10/10/2017  . Paroxysmal atrial flutter ([place]) 10/10/2017  . S/P CABG x 4 10/04/2017  . Cerebral embolism with cerebral infarction 09/20/2017  . NSVT (nonsustained ventricular tachycardia) ([place])   . Dyspnea 09/08/2017  . Non-ST elevation MI (NSTEMI) ([place])   . Acute on chronic combined systolic and diastolic heart failure ([place])   . End-stage renal disease on hemodialysis ([place])   . Left leg weakness   . TIA (transient ischemic attack) 10/27/2016  . History of CVA with residual deficit 08/07/2015  . Seizure, late effect of stroke ([place]) 08/07/2015  . Diabetic peripheral neuropathy ([place]) 08/07/2015  . Obstructive sleep apnea 06/16/2013  . Hyperlipidemia associated with type 2 diabetes mellitus ([place])   . ESRD (end stage renal disease) ([place])   . Erectile dysfunction associated with type 2 diabetes mellitus ([place])   . Hypertension due to end stage renal disease caused by type 2 diabetes mellitus, on dialysis ([place])   . COPD (chronic obstructive pulmonary disease) ([place])   . Coronary artery disease   . Chronic combined systolic and diastolic CHF (congestive heart failure) ([place])   . Gout, unspecified 02/28/2009  . Uncontrolled type 2 diabetes mellitus with end-stage renal disease ([place]) 06/08/1984    Past  Surgical History:  Procedure Laterality Date  . A/V FISTULAGRAM N/A 10/04/2017   Procedure: A/V FISTULAGRAM;  Surgeon: Elam Dutch, MD;  Location: [facility] CV LAB;  Service: Cardiovascular;  Laterality: N/A;  . AV FISTULA PLACEMENT Left 01/13/2013   Procedure: INSERTION OF ARTERIOVENOUS (AV) GORE-TEX GRAFT ARM;  Surgeon: Angelia Mould, MD;  Location: [place];  Service: Vascular;  Laterality: Left;  . AV FISTULA PLACEMENT Left 05/05/2013   Procedure: INSERTION OF LEFT UPPER ARM  ARTERIOVENOUS GORTEX GRAFT;  Surgeon: Angelia Mould, MD;  Location: [facility];  Service: Vascular;  Laterality: Left;  . [place] REMOVAL Left 03/26/2013   Procedure:  REMOVAL OF  NON INCORPORATED ARTERIOVENOUS GORETEX GRAFT ([place]) left arm * repair of  left brachial artery with vein patch angioplasty.;  Surgeon: Angelia Mould, MD;  Location: [place];  Service: Vascular;  Laterality: Left;  . CARDIAC CATHETERIZATION  August 26, 2012  . CATARACT EXTRACTION W/ INTRAOCULAR LENS  IMPLANT, BILATERAL    . COLONOSCOPY    . CORONARY ARTERY BYPASS GRAFT N/A 09/13/2017   Procedure: CORONARY ARTERY BYPASS GRAFTING (CABG) times four LIMA  to LAD, SVG sequentially to OM and RAMUS Intermediate and SVG to Acute Marginal;  Surgeon: Melrose Nakayama, MD;  Location: [place];  Service: Open Heart Surgery;  Laterality: N/A;  . EMBOLECTOMY Right 08/27/2013   Procedure: EMBOLECTOMY;  Surgeon: Mal Misty, MD;  Location: [facility];  Service: Vascular;  Laterality: Right;  Thrombectomy of Radial and ulnar artery.  [name] EYE SURGERY     laser.  and surgery for DM  . fistula     RUE and wrist  . INSERTION OF DIALYSIS CATHETER Right 01/01/2013   Procedure: INSERTION OF DIALYSIS CATHETER right  internal jugular;  Surgeon: Serafina Mitchell, MD;  Location: [place];  Service: Vascular;  Laterality: Right;  . INSERTION OF DIALYSIS CATHETER N/A 03/26/2013   Procedure: INSERTION OF DIALYSIS CATHETER Left internal jugular vein;  Surgeon: Angelia Mould, MD;  Location: [place];  Service: Vascular;  Laterality: N/A;  . LEFT HEART CATH AND CORONARY ANGIOGRAPHY N/A 09/09/2017   Procedure: LEFT HEART CATH AND CORONARY ANGIOGRAPHY;  Surgeon: Troy Sine, MD;  Location: [place] CV LAB;  Service: Cardiovascular;  Laterality: N/A;  . LIGATION OF ARTERIOVENOUS  FISTULA Right 12/30/2013   Procedure: REMOVAL OF SEGMENT OF GORTEX GRAFT AND FISTULA  AND REPAIR OF BRACHIAL ARTERY;  Surgeon: Mal Misty, MD;  Location: [place];  Service: Vascular;  Laterality: Right;  . MITRAL VALVE REPAIR N/A 09/13/2017   Procedure: MITRAL VALVE REPAIR (MVR);  Surgeon: Melrose Nakayama, MD;  Location: [place] [facility];  Service: Open Heart Surgery;  Laterality: N/A;  . MITRAL VALVE REPAIR (MV)/CORONARY ARTERY BYPASS GRAFTING (CABG)  09/13/2017  . PENILE PROSTHESIS IMPLANT     1997.. no card  . PERIPHERAL VASCULAR BALLOON ANGIOPLASTY Left 10/04/2017   Procedure: PERIPHERAL VASCULAR BALLOON ANGIOPLASTY;  Surgeon: Elam Dutch, MD;  Location: [place] CV LAB;  Service: Cardiovascular;  Laterality: Left;  AVF  . TEE WITHOUT CARDIOVERSION N/A 06/11/2013   Procedure: TRANSESOPHAGEAL ECHOCARDIOGRAM (TEE);  Surgeon: Larey Dresser, MD;  Location: [facility] ENDOSCOPY;  Service: Cardiovascular;  Laterality: N/A;  . TEE WITHOUT CARDIOVERSION N/A 09/13/2017   Procedure: TRANSESOPHAGEAL ECHOCARDIOGRAM (TEE);  Surgeon: Melrose Nakayama, MD;  Location: [facility];  Service: Open Heart Surgery;  Laterality: N/A;  . THROMBECTOMY W/ EMBOLECTOMY Left 03/26/2013   Procedure: Attempted thrombectomy of left arm  arteriovenous goretex graft.;  Surgeon: Angelia Mould, MD;  Location: [place];  Service: Vascular;  Laterality: Left;  [name] VENOGRAM N/A 04/07/2013   Procedure: VENOGRAM;  Surgeon: Serafina Mitchell, MD;  Location: [facility] C[facility] LAB;  Service: Cardiovascular;  Laterality: N/A;       Home Medications    Prior to Admission medications   Medication Sig Start Date End Date Taking? Authorizing Provider  amiodarone (PACERONE) 200 MG tablet Take 1 tablet (200 mg total) by mouth 2 (two) times daily. 10/07/17   Keidan Giovanni, [name]  aspirin EC 81 MG tablet Take 1 tablet (81 mg total) by mouth daily. 11/29/15   Rivet, Sindy Guadeloupe, MD  atorvastatin (LIPITOR) 40 MG tablet Take 40 mg by mouth daily.     [provider]  B Complex-C-Folic Acid (NEPHRO-VITE PO) Take 1 tablet by mouth daily.    [provider]  calcium acetate (PHOSLO) 667 MG capsule Take 667 mg by mouth 3 (three) times daily with meals.     [provider]  cinacalcet (SENSIPAR) 30 MG tablet Take 4 tablets (120 mg total) by mouth daily with  supper. 10/07/17   Gold, Wilder Glade, [name]  Darbepoetin Alfa (ARANESP) 100 MCG/0.5ML SOSY injection Inject 100 mcg into the skin every 7 (seven) days. Administered with dialysis    [provider]  doxercalciferol (HECTOROL) 4 MCG/2ML injection Inject 2 mcg into the vein every Monday, Wednesday, and Friday with hemodialysis. Administered at dialysis    [provider]  ferric citrate (AURYXIA) 1 GM 210 MG(Fe) tablet Take 420 mg by mouth 3 (three) times daily with meals.     [provider]  ferric gluconate 125 mg in sodium chloride 0.9 % 100 mL Inject 250 mg into the vein every Monday, Wednesday, and Friday with hemodialysis.     [provider]  insulin detemir (LEVEMIR) 100 UNIT/ML injection Inject 0.2 mLs (20 Units total) into the skin 2 (two) times daily. 10/07/17   Gold, Wilder Glade, [name]  levETIRAcetam (KEPPRA) 500 MG tablet Take 2 tablets (1,000 mg total) by mouth daily. Then take extra 500 mg (1 tablet) on MWF after HD 11/29/15   Rivet, Sindy Guadeloupe, MD  linaclotide (LINZESS) 145 MCG CAPS capsule Take 145 mcg by mouth daily.    [provider]  metoprolol tartrate (LOPRESSOR) 50 MG tablet Take 50 mg by mouth daily. Take one-half tablet qd    [provider]  Nutritional Supplements (FEEDING SUPPLEMENT, NEPRO CARB STEADY,) LIQD Take 237 mLs by mouth 2 (two) times daily between meals. 10/07/17   Gold, Wayne E, [name]  sennosides-docusate sodium (SENOKOT-S) 8.6-50 MG tablet Take 2 tablets by mouth 2 (two) times daily.    [provider]  sertraline (ZOLOFT) 25 MG tablet Take 25 mg by mouth at bedtime.    [provider]  warfarin (COUMADIN) 4 MG tablet Take 4 mg by mouth daily.    [provider]    Family History Family History  Problem Relation Age of Onset  . Heart disease Father   . Diabetes Sister   . Diabetes Brother   . Diabetes Son     Social History Social History   Tobacco Use  . Smoking status: Former Smoker     Years: 7.00    Last attempt to quit: 06/10/1973    Years since quitting: 44.5  . Smokeless tobacco: Never Used  . Tobacco comment: Quit in 1974.  Substance Use Topics  . Alcohol use:  No    Alcohol/week: 0.0 oz  . Drug use: No     Allergies   Patient has no known allergies.   Review of Systems Review of Systems  Constitutional: Negative for fever.  Respiratory: Negative for shortness of breath.   Cardiovascular: Negative for chest pain.  Gastrointestinal: Negative for abdominal pain, nausea and vomiting.  Neurological: Negative for weakness and numbness.  Hematological:       Fistula bleeding     Physical Exam Updated Vital Signs BP 134/78   Pulse 78   Temp 97.7 F (36.5 C) (Oral)   Resp 19   Ht 5\' 9"  (1.753 m)   Wt 98.4 kg (217 lb)   SpO2 100%   BMI 32.05 kg/m   Physical Exam  Constitutional: He is oriented to person, place, and time. He appears well-developed and well-nourished.  HENT:  Head: Normocephalic and atraumatic.  Mouth/Throat: Oropharynx is clear and moist and mucous membranes are normal.  Eyes: Conjunctivae, EOM and lids are normal. Pupils are equal, round, and reactive to light.  Neck: Full passive range of motion without pain.  Cardiovascular: Normal rate, regular rhythm, normal heart sounds and normal pulses. Exam reveals no gallop and no friction rub.  No murmur heard. LUE fistula with clamp in place. No active bleeding.   Pulmonary/Chest: Effort normal and breath sounds normal.  No evidence of respiratory distress. Able to speak in full sentences without difficulty.  Abdominal: Soft. Normal appearance. There is no tenderness. There is no rigidity and no guarding.  Musculoskeletal: Normal range of motion.  No tenderness to palpation of LUE.   Neurological: He is alert and oriented to person, place, and time.  Skin: Skin is warm and dry. Capillary refill takes less than 2 seconds.  Psychiatric: He has a normal mood and affect. His speech is  normal.  Nursing note and vitals reviewed.    ED Treatments / Results  Labs (all labs ordered are listed, but only abnormal results are displayed) Labs Reviewed  CBC WITH DIFFERENTIAL/PLATELET - Abnormal; Notable for the following components:      Result Value   RBC 3.90 (*)    Hemoglobin 11.3 (*)    HCT 34.2 (*)    RDW 18.2 (*)    All other components within normal limits  BASIC METABOLIC PANEL - Abnormal; Notable for the following components:   Chloride 96 (*)    Glucose, Bld 106 (*)    Creatinine, Ser 5.21 (*)    Calcium 8.3 (*)    GFR calc non Af Amer 10 (*)    GFR calc Af Amer 12 (*)    All other components within normal limits  PROTIME-INR - Abnormal; Notable for the following components:   Prothrombin Time 29.5 (*)    All other components within normal limits    EKG  EKG Interpretation None       Radiology No results found.  Procedures Procedures (including critical care time)  Medications Ordered in ED Medications - No data to display   Initial Impression / Assessment and Plan / ED Course  I have reviewed the triage vital signs and the nursing notes.  Pertinent labs & imaging results that were available during my care of the patient were reviewed by me and considered in my medical decision making (see chart for details).    72 year old male who presents for evaluation of fistula bleeding.  Patient is a Monday, Wednesday, Friday dialysis patient.  Was at dialysis when his fistula in  his left upper extremity started bleeding.  They could not get bleeding to start.  Patient is on warfarin and aspirin.  Patient sent to the ED for control bleeding.  In the ED, bleeding is controlled but there is a clamp in place.  No active bleeding at this time.  Vitals stable.  Patient denies any pain.  We will plan to have hemodialysis nurse, and evaluate fistula. Will plan to check basic labs.  INR is 2.2.  BMP shows elevated BUN and creatinine but appears to be at  baseline.  Potassium is within normal limits.  CBC shows hemoglobin appears to be at patient's baseline.  Reevaluation patient.  Hemodialysis nurse was able to stop bleeding.  Site is a probably bandaged.  No active bleeding at this time.  Patient denies any pain at this time.  Vital signs are stable.  I discussed with Dr. Eulis Foster who independently evaluated patient.  Patient stable for discharge at this time.  Instructed patient to follow-up with dialysis as previously scheduled. Patient had ample opportunity for questions and discussion. All patient's questions were answered with full understanding. Strict return precautions discussed. Patient expresses understanding and agreement to plan.    Final Clinical Impressions(s) / ED Diagnoses   Final diagnoses:  Bleeding from dialysis shunt, initial encounter [facility])    ED Discharge Orders    None       Desma Mcgregor 12/18/17 1831    Daleen Bo, MD 12/18/17 2234

## 2017-12-18 NOTE — ED Triage Notes (Signed)
Pt was receiving HD this AM, with an hour remaining, pt's Left AV fistula began to bleed.  They attempted for one hour to control bleeding.  A clamp was placed and pt was sent via EMS to ED.  Bleeding is controlled with clamp in place upon arrival to ED.  VSS.  NAD noted.  Pt denies pain.

## 2017-12-18 NOTE — ED Provider Notes (Signed)
  Face-to-face evaluation   History: Patient here because AV fistula was bleeding, after dialysis.  It was unable to be controlled with pressure, therefore he was sent here. Physical exam: Alert, calm, cooperative.  Left upper arm fistula, with normal pulsation.  Evaluated at 14: 20 p.m., no active bleeding.  Medical screening examination/treatment/procedure(s) were conducted as a shared visit with non-physician practitioner(s) and myself.  I personally evaluated the patient during the encounter    Daleen Bo, MD 12/18/17 2234

## 2017-12-18 NOTE — ED Notes (Signed)
[facility] called for transport.  

## 2017-12-20 DIAGNOSIS — D509 Iron deficiency anemia, unspecified: Secondary | ICD-10-CM | POA: Diagnosis not present

## 2017-12-20 DIAGNOSIS — E119 Type 2 diabetes mellitus without complications: Secondary | ICD-10-CM | POA: Diagnosis not present

## 2017-12-20 DIAGNOSIS — N186 End stage renal disease: Secondary | ICD-10-CM | POA: Diagnosis not present

## 2017-12-20 DIAGNOSIS — D631 Anemia in chronic kidney disease: Secondary | ICD-10-CM | POA: Diagnosis not present

## 2017-12-20 DIAGNOSIS — N2581 Secondary hyperparathyroidism of renal origin: Secondary | ICD-10-CM | POA: Diagnosis not present

## 2017-12-23 ENCOUNTER — Ambulatory Visit: Payer: Medicare Other | Admitting: Cardiology

## 2017-12-23 ENCOUNTER — Telehealth: Payer: Self-pay | Admitting: *Deleted

## 2017-12-23 DIAGNOSIS — N2581 Secondary hyperparathyroidism of renal origin: Secondary | ICD-10-CM | POA: Diagnosis not present

## 2017-12-23 DIAGNOSIS — D631 Anemia in chronic kidney disease: Secondary | ICD-10-CM | POA: Diagnosis not present

## 2017-12-23 DIAGNOSIS — N186 End stage renal disease: Secondary | ICD-10-CM | POA: Diagnosis not present

## 2017-12-23 DIAGNOSIS — D509 Iron deficiency anemia, unspecified: Secondary | ICD-10-CM | POA: Diagnosis not present

## 2017-12-23 DIAGNOSIS — E119 Type 2 diabetes mellitus without complications: Secondary | ICD-10-CM | POA: Diagnosis not present

## 2017-12-23 NOTE — Telephone Encounter (Signed)
-----   Message from Isaiah Serge, NP sent at 12/23/2017  1:59 PM EDT ----- Please reschedule pt with me or Dr. Irish Lack in a couple of weeks on non dialysis day thanks. Mickel Baas

## 2017-12-23 NOTE — Telephone Encounter (Signed)
Per Cecilie Kicks, NP pt was supposed to see her today 12/23/17. Per Cecilie Kicks, NP she thinks pt was probably at dialysis. Per Cecilie Kicks, NP pt needs to be rescheduled to see her in a few weeks though on a non dialysis day. I have lmom to call and reschedule appt with Cecilie Kicks, NP.

## 2017-12-23 NOTE — Progress Notes (Deleted)
Cardiology Office Note   Date:  12/23/2017   ID:  Ernest [name], DOB [birth year], MRN [id number]  PCP:  Patient, [name]  Cardiologist:  ***    No chief complaint on file.     History of Present Illness: Ernest [name] is a 72 y.o. male who presents for ***    Past Medical History:  Diagnosis Date  . Abnormal stress test    s/p cath November 2013 with modest disease involving the ostial left main, proximal LAD, proximal RCA - do not appear to be hemodynamically signficant; mild LV dysfunction  . Bacteremia   . Chronic systolic CHF (congestive heart failure) ([place])   . COPD (chronic obstructive pulmonary disease) ([place])   . Coronary artery disease    a. per cath report 2013, b. 09/13/2017-CABG X4 & Mitral valve repair  . Ejection fraction < 50%   . Erectile dysfunction    penile implant  . ESRD (end stage renal disease) ([place])    Started HD in [place] in 2009, ESRD was due to DM. Moved to [facility] in Dec 2009 and now gets dialysis at [facility] on a MWF schedule.     . Gout    Hx: of  . Hepatitis C   . Hyperlipidemia   . Hypertension   . Mitral valve disease    s/p Mitral repair 09/13/17  . Obesity   . Retinopathy   . Shortness of breath    Hx: of with exertion  . Stroke ([place])   . Type II or unspecified type diabetes mellitus without mention of complication, not stated as uncontrolled    adult onset    Past Surgical History:  Procedure Laterality Date  . A/V FISTULAGRAM N/A 10/04/2017   Procedure: A/V FISTULAGRAM;  Surgeon: Elam Dutch, MD;  Location: [place] [facility];  Service: Cardiovascular;  Laterality: N/A;  . AV FISTULA PLACEMENT Left 01/13/2013   Procedure: INSERTION OF ARTERIOVENOUS (AV) GORE-TEX GRAFT ARM;  Surgeon: Angelia Mould, MD;  Location: [place];  Service: Vascular;  Laterality: Left;  . AV FISTULA PLACEMENT Left 05/05/2013   Procedure: INSERTION OF LEFT UPPER ARM  ARTERIOVENOUS GORTEX GRAFT;  Surgeon: Angelia Mould, MD;   Location: [place];  Service: Vascular;  Laterality: Left;  . [place] REMOVAL Left 03/26/2013   Procedure: REMOVAL OF  NON INCORPORATED ARTERIOVENOUS GORETEX GRAFT ([place]) left arm * repair of  left brachial artery with vein patch angioplasty.;  Surgeon: Angelia Mould, MD;  Location: [place];  Service: Vascular;  Laterality: Left;  . CARDIAC CATHETERIZATION  August 26, 2012  . CATARACT EXTRACTION W/ INTRAOCULAR LENS  IMPLANT, BILATERAL    . COLONOSCOPY    . CORONARY ARTERY BYPASS GRAFT N/A 09/13/2017   Procedure: CORONARY ARTERY BYPASS GRAFTING (CABG) times four LIMA  to LAD, SVG sequentially to OM and RAMUS Intermediate and SVG to Acute Marginal;  Surgeon: Melrose Nakayama, MD;  Location: [place];  Service: Open Heart Surgery;  Laterality: N/A;  . EMBOLECTOMY Right 08/27/2013   Procedure: EMBOLECTOMY;  Surgeon: Mal Misty, MD;  Location: [place];  Service: Vascular;  Laterality: Right;  Thrombectomy of Radial and ulnar artery.  [name] EYE SURGERY     laser.  and surgery for DM  . fistula     RUE and wrist  . INSERTION OF DIALYSIS CATHETER Right 01/01/2013   Procedure: INSERTION OF DIALYSIS CATHETER right  internal jugular;  Surgeon: Serafina Mitchell, MD;  Location:  [facility];  Service: Vascular;  Laterality: Right;  . INSERTION OF DIALYSIS CATHETER N/A 03/26/2013   Procedure: INSERTION OF DIALYSIS CATHETER Left internal jugular vein;  Surgeon: Angelia Mould, MD;  Location: [place];  Service: Vascular;  Laterality: N/A;  . LEFT HEART CATH AND CORONARY ANGIOGRAPHY N/A 09/09/2017   Procedure: LEFT HEART CATH AND CORONARY ANGIOGRAPHY;  Surgeon: Troy Sine, MD;  Location: [place] [facility];  Service: Cardiovascular;  Laterality: N/A;  . LIGATION OF ARTERIOVENOUS  FISTULA Right 12/30/2013   Procedure: REMOVAL OF SEGMENT OF GORTEX GRAFT AND FISTULA  AND REPAIR OF BRACHIAL ARTERY;  Surgeon: Mal Misty, MD;  Location: [place];  Service: Vascular;  Laterality: Right;  . MITRAL VALVE REPAIR N/A  09/13/2017   Procedure: MITRAL VALVE REPAIR (MVR);  Surgeon: Melrose Nakayama, MD;  Location: [place];  Service: Open Heart Surgery;  Laterality: N/A;  . MITRAL VALVE REPAIR (MV)/CORONARY ARTERY BYPASS GRAFTING (CABG)  09/13/2017  . PENILE PROSTHESIS IMPLANT     1997.. no card  . PERIPHERAL VASCULAR BALLOON ANGIOPLASTY Left 10/04/2017   Procedure: PERIPHERAL VASCULAR BALLOON ANGIOPLASTY;  Surgeon: Elam Dutch, MD;  Location: [place] [facility];  Service: Cardiovascular;  Laterality: Left;  AVF  . TEE WITHOUT CARDIOVERSION N/A 06/11/2013   Procedure: TRANSESOPHAGEAL ECHOCARDIOGRAM (TEE);  Surgeon: Larey Dresser, MD;  Location: [facility] ENDOSCOPY;  Service: Cardiovascular;  Laterality: N/A;  . TEE WITHOUT CARDIOVERSION N/A 09/13/2017   Procedure: TRANSESOPHAGEAL ECHOCARDIOGRAM (TEE);  Surgeon: Melrose Nakayama, MD;  Location: [place];  Service: Open Heart Surgery;  Laterality: N/A;  . THROMBECTOMY W/ EMBOLECTOMY Left 03/26/2013   Procedure: Attempted thrombectomy of left arm arteriovenous goretex graft.;  Surgeon: Angelia Mould, MD;  Location: [place];  Service: Vascular;  Laterality: Left;  [name] VENOGRAM N/A 04/07/2013   Procedure: VENOGRAM;  Surgeon: Serafina Mitchell, MD;  Location: [facility] CATH LAB;  Service: Cardiovascular;  Laterality: N/A;     Current Outpatient Medications  Medication Sig Dispense Refill  . amiodarone (PACERONE) 200 MG tablet Take 1 tablet (200 mg total) by mouth 2 (two) times daily.    [name] aspirin EC 81 MG tablet Take 1 tablet (81 mg total) by mouth daily. 30 tablet 11  . atorvastatin (LIPITOR) 40 MG tablet Take 40 mg by mouth daily.     . B Complex-C-Folic Acid (NEPHRO-VITE PO) Take 1 tablet by mouth daily.    . calcium acetate (PHOSLO) 667 MG capsule Take 667 mg by mouth 3 (three) times daily with meals.     . cinacalcet (SENSIPAR) 30 MG tablet Take 4 tablets (120 mg total) by mouth daily with supper. 60 tablet   . Darbepoetin Alfa (ARANESP) 100 MCG/0.5ML SOSY  injection Inject 100 mcg into the skin every 7 (seven) days. Administered with dialysis    . doxercalciferol (HECTOROL) 4 MCG/2ML injection Inject 2 mcg into the vein every Monday, Wednesday, and Friday with hemodialysis. Administered at dialysis    . ferric citrate (AURYXIA) 1 GM 210 MG(Fe) tablet Take 420 mg by mouth 3 (three) times daily with meals.     . ferric gluconate 125 mg in sodium chloride 0.9 % 100 mL Inject 250 mg into the vein every Monday, Wednesday, and Friday with hemodialysis.     [name] insulin detemir (LEVEMIR) 100 UNIT/ML injection Inject 0.2 mLs (20 Units total) into the skin 2 (two) times daily. 10 mL 11  . levETIRAcetam (KEPPRA) 500 MG tablet Take 2 tablets (1,000 mg total)  by mouth daily. Then take extra 500 mg (1 tablet) on MWF after HD 72 tablet 5  . linaclotide (LINZESS) 145 MCG CAPS capsule Take 145 mcg by mouth daily.    . metoprolol tartrate (LOPRESSOR) 50 MG tablet Take 50 mg by mouth daily. Take one-half tablet qd    . Nutritional Supplements (FEEDING SUPPLEMENT, NEPRO CARB STEADY,) LIQD Take 237 mLs by mouth 2 (two) times daily between meals.  0  . sennosides-docusate sodium (SENOKOT-S) 8.6-50 MG tablet Take 2 tablets by mouth 2 (two) times daily.    . sertraline (ZOLOFT) 25 MG tablet Take 25 mg by mouth at bedtime.    [name] warfarin (COUMADIN) 4 MG tablet Take 4 mg by mouth daily.     No current facility-administered medications for this visit.     Allergies:   Patient has no known allergies.    Social History:  The patient  reports that he quit smoking about 44 years ago. He quit after 7.00 years of use. he has never used smokeless tobacco. He reports that he does not drink alcohol or use drugs.   Family History:  The patient's ***family history includes Diabetes in his brother, sister, and son; Heart disease in his father.    ROS:  General:no colds or fevers, no weight changes Skin:no rashes or ulcers HEENT:no blurred vision, no congestion CV:see HPI PUL:see  HPI GI:no diarrhea constipation or melena, no indigestion GU:no hematuria, no dysuria MS:no joint pain, no claudication Neuro:no syncope, no lightheadedness Endo:no diabetes, no thyroid disease Wt Readings from Last 3 Encounters:  12/18/17 217 lb (98.4 kg)  12/09/17 205 lb 3.2 oz (93.1 kg)  12/06/17 205 lb 4.2 oz (93.1 kg)     PHYSICAL EXAM: VS:  There were no vitals taken for this visit. , BMI There is no height or weight on file to calculate BMI. General:Pleasant affect, NAD Skin:Warm and dry, brisk capillary refill HEENT:normocephalic, sclera clear, mucus membranes moist Neck:supple, no JVD, no bruits  Heart:S1S2 RRR without murmur, gallup, rub or click Lungs:clear without rales, rhonchi, or wheezes SEG:BTDV, non tender, + BS, do not palpate liver spleen or masses Ext:no lower ext edema, 2+ pedal pulses, 2+ radial pulses Neuro:alert and oriented, MAE, follows commands, + facial symmetry    EKG:  EKG is ordered today. The ekg ordered today demonstrates ***   Recent Labs: 09/08/2017: B Natriuretic Peptide 4,125.9 09/20/2017: Magnesium 2.0 11/08/2017: ALT 14 12/18/2017: BUN 17; Creatinine, Ser 5.21; Hemoglobin 11.3; Platelets 227; Potassium 4.6; Sodium 139    Lipid Panel    Component Value Date/Time   CHOL 116 10/28/2016 0442   TRIG 92 10/28/2016 0442   HDL 48 10/28/2016 0442   CHOLHDL 2.4 10/28/2016 0442   VLDL 18 10/28/2016 0442   LDLCALC 50 10/28/2016 0442       Other studies Reviewed: Additional studies/ records that were reviewed today include: ***.   ASSESSMENT AND PLAN:  1.  ***   Current medicines are reviewed with the patient today.  The patient Has no concerns regarding medicines.  The following changes have been made:  See above Labs/ tests ordered today include:see above  Disposition:   FU:  see above  Signed, Cecilie Kicks, NP  12/23/2017 12:15 PM    [facility] [address], [place], [place] Phone: [phone]; Fax: [phone]

## 2017-12-24 ENCOUNTER — Encounter: Payer: Self-pay | Admitting: Cardiology

## 2017-12-24 DIAGNOSIS — Z992 Dependence on renal dialysis: Secondary | ICD-10-CM | POA: Diagnosis not present

## 2017-12-24 DIAGNOSIS — I871 Compression of vein: Secondary | ICD-10-CM | POA: Diagnosis not present

## 2017-12-24 DIAGNOSIS — N186 End stage renal disease: Secondary | ICD-10-CM | POA: Diagnosis not present

## 2017-12-24 DIAGNOSIS — T82858A Stenosis of vascular prosthetic devices, implants and grafts, initial encounter: Secondary | ICD-10-CM | POA: Diagnosis not present

## 2017-12-24 LAB — POCT INR: INR: 3.4 — AB (ref 0.9–1.1)

## 2017-12-24 LAB — PROTIME-INR: Protime: 33.5 — AB (ref 10.0–13.8)

## 2017-12-25 DIAGNOSIS — D631 Anemia in chronic kidney disease: Secondary | ICD-10-CM | POA: Diagnosis not present

## 2017-12-25 DIAGNOSIS — D509 Iron deficiency anemia, unspecified: Secondary | ICD-10-CM | POA: Diagnosis not present

## 2017-12-25 DIAGNOSIS — E119 Type 2 diabetes mellitus without complications: Secondary | ICD-10-CM | POA: Diagnosis not present

## 2017-12-25 DIAGNOSIS — N2581 Secondary hyperparathyroidism of renal origin: Secondary | ICD-10-CM | POA: Diagnosis not present

## 2017-12-25 DIAGNOSIS — N186 End stage renal disease: Secondary | ICD-10-CM | POA: Diagnosis not present

## 2017-12-27 DIAGNOSIS — N186 End stage renal disease: Secondary | ICD-10-CM | POA: Diagnosis not present

## 2017-12-27 DIAGNOSIS — D509 Iron deficiency anemia, unspecified: Secondary | ICD-10-CM | POA: Diagnosis not present

## 2017-12-27 DIAGNOSIS — E119 Type 2 diabetes mellitus without complications: Secondary | ICD-10-CM | POA: Diagnosis not present

## 2017-12-27 DIAGNOSIS — D631 Anemia in chronic kidney disease: Secondary | ICD-10-CM | POA: Diagnosis not present

## 2017-12-27 DIAGNOSIS — N2581 Secondary hyperparathyroidism of renal origin: Secondary | ICD-10-CM | POA: Diagnosis not present

## 2017-12-27 LAB — PROTIME-INR: Protime: 25.4 — AB (ref 10.0–13.8)

## 2017-12-27 LAB — POCT INR: INR: 2.4 — AB (ref 0.9–1.1)

## 2017-12-30 DIAGNOSIS — N186 End stage renal disease: Secondary | ICD-10-CM | POA: Diagnosis not present

## 2017-12-30 DIAGNOSIS — D509 Iron deficiency anemia, unspecified: Secondary | ICD-10-CM | POA: Diagnosis not present

## 2017-12-30 DIAGNOSIS — N2581 Secondary hyperparathyroidism of renal origin: Secondary | ICD-10-CM | POA: Diagnosis not present

## 2017-12-30 DIAGNOSIS — D631 Anemia in chronic kidney disease: Secondary | ICD-10-CM | POA: Diagnosis not present

## 2017-12-30 DIAGNOSIS — E119 Type 2 diabetes mellitus without complications: Secondary | ICD-10-CM | POA: Diagnosis not present

## 2018-01-01 DIAGNOSIS — E119 Type 2 diabetes mellitus without complications: Secondary | ICD-10-CM | POA: Diagnosis not present

## 2018-01-01 DIAGNOSIS — D509 Iron deficiency anemia, unspecified: Secondary | ICD-10-CM | POA: Diagnosis not present

## 2018-01-01 DIAGNOSIS — D631 Anemia in chronic kidney disease: Secondary | ICD-10-CM | POA: Diagnosis not present

## 2018-01-01 DIAGNOSIS — N186 End stage renal disease: Secondary | ICD-10-CM | POA: Diagnosis not present

## 2018-01-01 DIAGNOSIS — N2581 Secondary hyperparathyroidism of renal origin: Secondary | ICD-10-CM | POA: Diagnosis not present

## 2018-01-03 DIAGNOSIS — D509 Iron deficiency anemia, unspecified: Secondary | ICD-10-CM | POA: Diagnosis not present

## 2018-01-03 DIAGNOSIS — E119 Type 2 diabetes mellitus without complications: Secondary | ICD-10-CM | POA: Diagnosis not present

## 2018-01-03 DIAGNOSIS — N186 End stage renal disease: Secondary | ICD-10-CM | POA: Diagnosis not present

## 2018-01-03 DIAGNOSIS — D631 Anemia in chronic kidney disease: Secondary | ICD-10-CM | POA: Diagnosis not present

## 2018-01-03 DIAGNOSIS — N2581 Secondary hyperparathyroidism of renal origin: Secondary | ICD-10-CM | POA: Diagnosis not present

## 2018-01-04 ENCOUNTER — Emergency Department (HOSPITAL_COMMUNITY)
Admission: EM | Admit: 2018-01-04 | Discharge: 2018-01-04 | Disposition: A | Discharge status: Home / Self Care | Payer: Medicare Other | Attending: Emergency Medicine | Admitting: Emergency Medicine

## 2018-01-04 ENCOUNTER — Encounter (HOSPITAL_COMMUNITY): Payer: Self-pay | Admitting: Oncology

## 2018-01-04 ENCOUNTER — Other Ambulatory Visit: Payer: Self-pay

## 2018-01-04 ENCOUNTER — Emergency Department (HOSPITAL_COMMUNITY): Payer: Medicare Other

## 2018-01-04 DIAGNOSIS — Z79899 Other long term (current) drug therapy: Secondary | ICD-10-CM | POA: Insufficient documentation

## 2018-01-04 DIAGNOSIS — I132 Hypertensive heart and chronic kidney disease with heart failure and with stage 5 chronic kidney disease, or end stage renal disease: Secondary | ICD-10-CM | POA: Insufficient documentation

## 2018-01-04 DIAGNOSIS — Z7982 Long term (current) use of aspirin: Secondary | ICD-10-CM | POA: Diagnosis not present

## 2018-01-04 DIAGNOSIS — R791 Abnormal coagulation profile: Secondary | ICD-10-CM | POA: Insufficient documentation

## 2018-01-04 DIAGNOSIS — J449 Chronic obstructive pulmonary disease, unspecified: Secondary | ICD-10-CM | POA: Diagnosis not present

## 2018-01-04 DIAGNOSIS — G8911 Acute pain due to trauma: Secondary | ICD-10-CM | POA: Diagnosis not present

## 2018-01-04 DIAGNOSIS — Z87891 Personal history of nicotine dependence: Secondary | ICD-10-CM | POA: Insufficient documentation

## 2018-01-04 DIAGNOSIS — R4182 Altered mental status, unspecified: Secondary | ICD-10-CM | POA: Insufficient documentation

## 2018-01-04 DIAGNOSIS — I5022 Chronic systolic (congestive) heart failure: Secondary | ICD-10-CM | POA: Insufficient documentation

## 2018-01-04 DIAGNOSIS — I2510 Atherosclerotic heart disease of native coronary artery without angina pectoris: Secondary | ICD-10-CM | POA: Insufficient documentation

## 2018-01-04 DIAGNOSIS — N186 End stage renal disease: Secondary | ICD-10-CM | POA: Insufficient documentation

## 2018-01-04 DIAGNOSIS — R69 Illness, unspecified: Secondary | ICD-10-CM | POA: Diagnosis not present

## 2018-01-04 DIAGNOSIS — E119 Type 2 diabetes mellitus without complications: Secondary | ICD-10-CM | POA: Insufficient documentation

## 2018-01-04 DIAGNOSIS — W19XXXA Unspecified fall, initial encounter: Secondary | ICD-10-CM

## 2018-01-04 DIAGNOSIS — N179 Acute kidney failure, unspecified: Secondary | ICD-10-CM | POA: Diagnosis not present

## 2018-01-04 DIAGNOSIS — Z043 Encounter for examination and observation following other accident: Secondary | ICD-10-CM | POA: Diagnosis present

## 2018-01-04 DIAGNOSIS — Z794 Long term (current) use of insulin: Secondary | ICD-10-CM | POA: Insufficient documentation

## 2018-01-04 LAB — PROTIME-INR
INR: 3.78
PROTHROMBIN TIME: 37 s — AB (ref 11.4–15.2)

## 2018-01-04 NOTE — ED Triage Notes (Signed)
Pt bib [facility] from home s/p fall.  Pt left [facility] yesterday AMA.  Per EMS pt's wife wanted pt transported here to be placed back at [facility].

## 2018-01-04 NOTE — ED Provider Notes (Signed)
[place] EMERGENCY DEPARTMENT Provider Note   CSN: [id number] Arrival date & time: 01/04/18  0307     History   Chief Complaint Chief Complaint  Patient presents with  . Fall    HPI Ernest [name] is a 71 y.o. male.  The history is provided by the patient.  Fall   He has history of end-stage renal disease on hemodialysis, chronic systolic heart failure, COPD, hypertension, hyperlipidemia, stroke, anticoagulated on warfarin because of prosthetic mitral valve and comes in after a fall.  He states that his leg gave out on him.  He denies injury.  Apparently, he left the skilled nursing facility where he is residing [facility] because he wanted to go home.  I have talked with him about the need for getting his strength back at debilitation facility so that it would be safe for him to go home at that point.  Past Medical History:  Diagnosis Date  . Abnormal stress test    s/p cath November 2013 with modest disease involving the ostial left main, proximal LAD, proximal RCA - do not appear to be hemodynamically signficant; mild LV dysfunction  . Bacteremia   . Chronic systolic CHF (congestive heart failure) ([place])   . COPD (chronic obstructive pulmonary disease) ([place])   . Coronary artery disease    a. per cath report 2013, b. 09/13/2017-CABG X4 & Mitral valve repair  . Ejection fraction < 50%   . Erectile dysfunction    penile implant  . ESRD (end stage renal disease) ([place])    Started HD in [place] in 2009, ESRD was due to DM. Moved to [facility] in Dec 2009 and now gets dialysis at [facility] on a MWF schedule.     . Gout    Hx: of  . Hepatitis C   . Hyperlipidemia   . Hypertension   . Mitral valve disease    s/p Mitral repair 09/13/17  . Obesity   . Retinopathy   . Shortness of breath    Hx: of with exertion  . Stroke ([place])   . Type II or unspecified type diabetes mellitus without mention of complication, not stated as uncontrolled    adult  onset    Patient Active Problem List   Diagnosis Date Noted  . Vascular dementia 10/10/2017  . Paroxysmal atrial flutter ([place]) 10/10/2017  . S/P CABG x 4 10/04/2017  . Cerebral embolism with cerebral infarction 09/20/2017  . NSVT (nonsustained ventricular tachycardia) ([place])   . Dyspnea 09/08/2017  . Non-ST elevation MI (NSTEMI) ([place])   . Acute on chronic combined systolic and diastolic heart failure ([place])   . End-stage renal disease on hemodialysis ([place])   . Left leg weakness   . TIA (transient ischemic attack) 10/27/2016  . History of CVA with residual deficit 08/07/2015  . Seizure, late effect of stroke ([place]) 08/07/2015  . Diabetic peripheral neuropathy ([place]) 08/07/2015  . Obstructive sleep apnea 06/16/2013  . Hyperlipidemia associated with type 2 diabetes mellitus ([place])   . ESRD (end stage renal disease) ([place])   . Erectile dysfunction associated with type 2 diabetes mellitus ([place])   . Hypertension due to end stage renal disease caused by type 2 diabetes mellitus, on dialysis ([place])   . COPD (chronic obstructive pulmonary disease) ([place])   . Coronary artery disease   . Chronic combined systolic and diastolic CHF (congestive heart failure) ([place])   . Gout, unspecified 02/28/2009  . Uncontrolled type 2 diabetes mellitus with  end-stage renal disease ([place]) 06/08/1984    Past Surgical History:  Procedure Laterality Date  . A/V FISTULAGRAM N/A 10/04/2017   Procedure: A/V FISTULAGRAM;  Surgeon: Elam Dutch, MD;  Location: [place] [facility] LAB;  Service: Cardiovascular;  Laterality: N/A;  . AV FISTULA PLACEMENT Left 01/13/2013   Procedure: INSERTION OF ARTERIOVENOUS (AV) GORE-TEX GRAFT ARM;  Surgeon: Angelia Mould, MD;  Location: [place];  Service: Vascular;  Laterality: Left;  . AV FISTULA PLACEMENT Left 05/05/2013   Procedure: INSERTION OF LEFT UPPER ARM  ARTERIOVENOUS GORTEX GRAFT;  Surgeon: Angelia Mould, MD;  Location: [place];  Service: Vascular;  Laterality: Left;  .  [place] REMOVAL Left 03/26/2013   Procedure: REMOVAL OF  NON INCORPORATED ARTERIOVENOUS GORETEX GRAFT ([place]) left arm * repair of  left brachial artery with vein patch angioplasty.;  Surgeon: Angelia Mould, MD;  Location: [place];  Service: Vascular;  Laterality: Left;  . CARDIAC CATHETERIZATION  August 26, 2012  . CATARACT EXTRACTION W/ INTRAOCULAR LENS  IMPLANT, BILATERAL    . COLONOSCOPY    . CORONARY ARTERY BYPASS GRAFT N/A 09/13/2017   Procedure: CORONARY ARTERY BYPASS GRAFTING (CABG) times four LIMA  to LAD, SVG sequentially to OM and RAMUS Intermediate and SVG to Acute Marginal;  Surgeon: Melrose Nakayama, MD;  Location: [place];  Service: Open Heart Surgery;  Laterality: N/A;  . EMBOLECTOMY Right 08/27/2013   Procedure: EMBOLECTOMY;  Surgeon: Mal Misty, MD;  Location: [place];  Service: Vascular;  Laterality: Right;  Thrombectomy of Radial and ulnar artery.  [name] EYE SURGERY     laser.  and surgery for DM  . fistula     RUE and wrist  . INSERTION OF DIALYSIS CATHETER Right 01/01/2013   Procedure: INSERTION OF DIALYSIS CATHETER right  internal jugular;  Surgeon: Serafina Mitchell, MD;  Location: [place];  Service: Vascular;  Laterality: Right;  . INSERTION OF DIALYSIS CATHETER N/A 03/26/2013   Procedure: INSERTION OF DIALYSIS CATHETER Left internal jugular vein;  Surgeon: Angelia Mould, MD;  Location: [place];  Service: Vascular;  Laterality: N/A;  . LEFT HEART CATH AND CORONARY ANGIOGRAPHY N/A 09/09/2017   Procedure: LEFT HEART CATH AND CORONARY ANGIOGRAPHY;  Surgeon: Troy Sine, MD;  Location: [place] CV LAB;  Service: Cardiovascular;  Laterality: N/A;  . LIGATION OF ARTERIOVENOUS  FISTULA Right 12/30/2013   Procedure: REMOVAL OF SEGMENT OF GORTEX GRAFT AND FISTULA  AND REPAIR OF BRACHIAL ARTERY;  Surgeon: Mal Misty, MD;  Location: [place];  Service: Vascular;  Laterality: Right;  . MITRAL VALVE REPAIR N/A 09/13/2017   Procedure: MITRAL VALVE REPAIR (MVR);  Surgeon:  Melrose Nakayama, MD;  Location: [place];  Service: Open Heart Surgery;  Laterality: N/A;  . MITRAL VALVE REPAIR (MV)/CORONARY ARTERY BYPASS GRAFTING (CABG)  09/13/2017  . PENILE PROSTHESIS IMPLANT     1997.. no card  . PERIPHERAL VASCULAR BALLOON ANGIOPLASTY Left 10/04/2017   Procedure: PERIPHERAL VASCULAR BALLOON ANGIOPLASTY;  Surgeon: Elam Dutch, MD;  Location: [place] CV LAB;  Service: Cardiovascular;  Laterality: Left;  AVF  . TEE WITHOUT CARDIOVERSION N/A 06/11/2013   Procedure: TRANSESOPHAGEAL ECHOCARDIOGRAM (TEE);  Surgeon: Larey Dresser, MD;  Location: [facility] ENDOSCOPY;  Service: Cardiovascular;  Laterality: N/A;  . TEE WITHOUT CARDIOVERSION N/A 09/13/2017   Procedure: TRANSESOPHAGEAL ECHOCARDIOGRAM (TEE);  Surgeon: Melrose Nakayama, MD;  Location: [place];  Service: Open Heart Surgery;  Laterality: N/A;  . THROMBECTOMY W/ EMBOLECTOMY Left  03/26/2013   Procedure: Attempted thrombectomy of left arm arteriovenous goretex graft.;  Surgeon: Angelia Mould, MD;  Location: [place];  Service: Vascular;  Laterality: Left;  [name] VENOGRAM N/A 04/07/2013   Procedure: VENOGRAM;  Surgeon: Serafina Mitchell, MD;  Location: [facility] CATH LAB;  Service: Cardiovascular;  Laterality: N/A;        Home Medications    Prior to Admission medications   Medication Sig Start Date End Date Taking? Authorizing Provider  amiodarone (PACERONE) 200 MG tablet Take 1 tablet (200 mg total) by mouth 2 (two) times daily. 10/07/17   Lewellyn Giovanni, [name]  aspirin EC 81 MG tablet Take 1 tablet (81 mg total) by mouth daily. 11/29/15   Rivet, Sindy Guadeloupe, MD  atorvastatin (LIPITOR) 40 MG tablet Take 40 mg by mouth daily.     [provider]  B Complex-C-Folic Acid (NEPHRO-VITE PO) Take 1 tablet by mouth daily.    [provider]  calcium acetate (PHOSLO) 667 MG capsule Take 667 mg by mouth 3 (three) times daily with meals.     [provider]  cinacalcet (SENSIPAR) 30 MG tablet Take 4 tablets  (120 mg total) by mouth daily with supper. 10/07/17   Gold, Wilder Glade, [name]  Darbepoetin Alfa (ARANESP) 100 MCG/0.5ML SOSY injection Inject 100 mcg into the skin every 7 (seven) days. Administered with dialysis    [provider]  doxercalciferol (HECTOROL) 4 MCG/2ML injection Inject 2 mcg into the vein every Monday, Wednesday, and Friday with hemodialysis. Administered at dialysis    [provider]  ferric citrate (AURYXIA) 1 GM 210 MG(Fe) tablet Take 420 mg by mouth 3 (three) times daily with meals.     [provider]  ferric gluconate 125 mg in sodium chloride 0.9 % 100 mL Inject 250 mg into the vein every Monday, Wednesday, and Friday with hemodialysis.     [provider]  insulin detemir (LEVEMIR) 100 UNIT/ML injection Inject 0.2 mLs (20 Units total) into the skin 2 (two) times daily. 10/07/17   Gold, Wilder Glade, [name]  levETIRAcetam (KEPPRA) 500 MG tablet Take 2 tablets (1,000 mg total) by mouth daily. Then take extra 500 mg (1 tablet) on MWF after HD 11/29/15   Rivet, Sindy Guadeloupe, MD  linaclotide (LINZESS) 145 MCG CAPS capsule Take 145 mcg by mouth daily.    [provider]  metoprolol tartrate (LOPRESSOR) 50 MG tablet Take 50 mg by mouth daily. Take one-half tablet qd    [provider]  Nutritional Supplements (FEEDING SUPPLEMENT, NEPRO CARB STEADY,) LIQD Take 237 mLs by mouth 2 (two) times daily between meals. 10/07/17   Gold, Wayne E, [name]  sennosides-docusate sodium (SENOKOT-S) 8.6-50 MG tablet Take 2 tablets by mouth 2 (two) times daily.    [provider]  sertraline (ZOLOFT) 25 MG tablet Take 25 mg by mouth at bedtime.    [provider]  warfarin (COUMADIN) 4 MG tablet Take 4 mg by mouth daily.    [provider]    Family History Family History  Problem Relation Age of Onset  . Heart disease Father   . Diabetes Sister   . Diabetes Brother   . Diabetes Son     Social History Social History   Tobacco  Use  . Smoking status: Former Smoker    Years: 7.00    Last attempt to quit: 06/10/1973    Years since quitting: 44.6  . Smokeless tobacco: Never Used  . Tobacco comment: Quit  in 1974.  Substance Use Topics  . Alcohol use: No    Alcohol/week: 0.0 oz  . Drug use: No     Allergies   Patient has no known allergies.   Review of Systems Review of Systems  All other systems reviewed and are negative.    Physical Exam Updated Vital Signs BP (!) 118/48 (BP Location: Right Arm)   Pulse 80   Temp 98.7 F (37.1 C) (Oral)   Resp 16   SpO2 95%   Physical Exam  Nursing note and vitals reviewed.  72 year old male, resting comfortably and in no acute distress. Vital signs are normal. Oxygen saturation is 95%, which is normal. Head is normocephalic and atraumatic. PERRLA, EOMI. Oropharynx is clear. Neck is nontender and supple without adenopathy or JVD. Back is nontender and there is no CVA tenderness. Lungs are clear without rales, wheezes, or rhonchi. Chest is nontender. Heart has regular rate and rhythm without murmur. Abdomen is soft, flat, nontender without masses or hepatosplenomegaly and peristalsis is normoactive. Extremities have no cyanosis or edema, full range of motion is present.  AV fistula is present in the left upper arm with thrill present. Skin is warm and dry without rash. Neurologic: Mental status is normal, cranial nerves are intact, there are no motor or sensory deficits.  ED Treatments / Results  Labs (all labs ordered are listed, but only abnormal results are displayed) Labs Reviewed  PROTIME-INR - Abnormal; Notable for the following components:      Result Value   Prothrombin Time 37.0 (*)    All other components within normal limits    Radiology Ct Head Wo Contrast  Result Date: 01/04/2018 CLINICAL DATA:  Altered mental status. EXAM: CT HEAD WITHOUT CONTRAST TECHNIQUE: Contiguous axial images were obtained from the base of the skull through the  vertex without intravenous contrast. COMPARISON:  CT head dated October 08, 2017. FINDINGS: Brain: No evidence of acute infarction, hemorrhage, hydrocephalus, extra-axial collection or mass lesion/mass effect. Stable mild cerebral atrophy and chronic microvascular ischemic changes. Unchanged calcifications in the basal ganglia and centrum semiovale. Old bilateral cerebellar lacunar infarcts again noted. Vascular: Calcified atherosclerosis at the skullbase. No hyperdense vessel. Skull: Normal. Negative for fracture or focal lesion. Sinuses/Orbits: No acute finding. Other: None. IMPRESSION: 1.  No acute intracranial abnormality. Electronically Signed   By: Titus Dubin M.D.   On: 01/04/2018 07:38    Procedures Procedures   Medications Ordered in ED Medications - No data to display   Initial Impression / Assessment and Plan / ED Course  I have reviewed the triage vital signs and the nursing notes.  Pertinent labs & imaging results that were available during my care of the patient were reviewed by me and considered in my medical decision making (see chart for details).  Fall without evidence of injury.  However, he is anticoagulated on warfarin, so we will check INR and CT of head.  Old records are reviewed, and he does have prior ED visits for falls.  CT is unremarkable.  INR is slightly elevated, but no need for immediate intervention as there is no active bleeding.  Social work has been consulted to assist in getting him back to his skilled nursing facility.  Final Clinical Impressions(s) / ED Diagnoses   Final diagnoses:  Fall at home, initial encounter  International normalized ratio (INR) greater than 3    ED Discharge Orders    None       Delora Fuel, MD 68/12/75  0755  

## 2018-01-04 NOTE — Care Management Note (Signed)
Case Management Note  Patient Details  Name: Ernest [name] MRN: [id number] Date of Birth: [birth year]  Subjective/Objective:          Pt presents from [facility] after signing out AMA and falling at home during the night.  Pt's wife, [name], is at home and is pt's caregiver.  [name] will not allow patient to return.  [name] states that there is no one to take care of patient but patient cannot be placed into a facility due to insurance and no ability to privately pay.     Action/Plan: [name] with [name] contacted to explore possibility of HomeFirst program.  [name] states that pt is ineligible due to non-compliance.    Called wife and attempted to discuss [place] vs Home hospice and DME.  Pt's wife very focused on the challenges of caring for patient and did not participate in discussion.  They have used [facility] in the past and she states she would like to use them again for [facility] services.  [name] with [facility] contacted for referral.    [name], CSW, able to arrange transportation for patient's dialysis on Monday through [name].  [facility] will relay to them to pick patient up at home instead of facility.  Pt's appointment is 6am per wife.    Expected Discharge Date:                  Expected Discharge Plan:  [place]  In-House Referral:  NA  Discharge planning Services  CM Consult  Post Acute Care Choice:  NA Choice offered to:  Spouse  DME Arranged:  N/A DME Agency:  NA  HH Arranged:  RN, PT, OT, Nurse's Aide, Social Work [employer] Agency:  [name]  Status of Service:  Completed, signed off  If discussed at [employer], dates discussed:    Additional Comments:  Claudie Leach, RN 01/04/2018, 4:05 PM

## 2018-01-04 NOTE — ED Notes (Signed)
Social work contacted, states we are waiting to hear back from [facility] at this time. States she will call back when she hears from them.

## 2018-01-04 NOTE — ED Notes (Signed)
Pt reports he fell today onto his knees and it hurt but does not now.  He wants to go back to "[place] home".  Asked RN to help him get back to [place].  He stated that he is having diarrhea as well.

## 2018-01-04 NOTE — Discharge Instructions (Signed)
Your evaluated in the emergency department for a fall.  Your CAT scan of your head showed no evidence of any bleeding.  We attempted to try to get you back into rehab but the facility would not accept you.  You are being returned to home by ambulance and we are trying to get you home services.  Please contact your doctor for follow-up and return to the emergency department if any worsening symptoms.

## 2018-01-04 NOTE — ED Notes (Signed)
pts wife would like to be called regarding if the patient can go back to [facility] [phone] [facility].

## 2018-01-04 NOTE — Clinical Social Work Note (Signed)
Clinical Social Worker spoke with patient wife and explained that patient will not be accepted back to [facility] due to his decision to leave Against Medical Advice last night.  Patient spouse is very upset and feels that she cannot care for patient adequately in the home.  CSW explained barriers for placement including limited Medicare days without wellness period and no inpatient hospital stay for qualifying stay.    CSW contacted RNCM regarding the possibility of Home First, however patient not eligible due to his noncompliance per resources representative.  Patient arranged with [facility] per [facility] [place].  RNCM offered home Hospice as additional support, however patient spouse refused.  Patient dialysis is scheduled for Monday morning and traditionally uses [facility] transport from [facility] is in agreement to update facility DON who will update [facility] regarding patient relocation and need for transport from home address.  RNCM made aware.  CSW available for support as needed.  Barbette Or, [place]

## 2018-01-04 NOTE — ED Notes (Signed)
There is no wow for pt to be able to sign. Pt is aware and has given verbal consent of discharge.

## 2018-01-04 NOTE — ED Provider Notes (Signed)
Clinical Course as of Jan 04 1529  Sat Jan 04, 2018  1214 Patient was signed out to me from Dr. Roxanne Mins pending social work evaluation.  We are reconnecting social work aware of her no further updates on him whether he can be placed to facility or not.   [MB]  1250 Got a call back from social work.  His facility was unwilling to take him back.  He also has no medical necessity for admission of the hospital.  Social work is going to review this with the wife but likely he will be discharged back to home.   [MB]  1638 Plan from social is that he is can return home by ambulance and they are trying to set him up with home services.   [MB]    Clinical Course User Index [MB] Hayden Rasmussen, MD      Hayden Rasmussen, MD 01/06/18 [phone]

## 2018-01-04 NOTE — ED Notes (Signed)
Waiting on [facility] to take pt home; pt to be moved to hallway bed.

## 2018-01-04 NOTE — Clinical Social Work Note (Signed)
Clinical Social Worker received referral.  Left message for admissions coordinator at [facility] to determine patient eligibility with return.  Patient with traditional Medicare and no qualifying stay, so unless facility has a way around, patient will likely need to return home at discharge.  CSW to update RN and MD once plans determined.  Barbette Or, [place]

## 2018-01-07 ENCOUNTER — Emergency Department (HOSPITAL_COMMUNITY)
Admission: EM | Admit: 2018-01-07 | Discharge: 2018-01-07 | Disposition: A | Discharge status: Home / Self Care | Payer: Medicare Other | Attending: Emergency Medicine | Admitting: Emergency Medicine

## 2018-01-07 ENCOUNTER — Other Ambulatory Visit: Payer: Self-pay

## 2018-01-07 ENCOUNTER — Encounter (HOSPITAL_COMMUNITY): Payer: Self-pay | Admitting: Emergency Medicine

## 2018-01-07 ENCOUNTER — Ambulatory Visit: Payer: Medicare Other | Admitting: Cardiology

## 2018-01-07 DIAGNOSIS — I252 Old myocardial infarction: Secondary | ICD-10-CM | POA: Diagnosis not present

## 2018-01-07 DIAGNOSIS — N186 End stage renal disease: Secondary | ICD-10-CM | POA: Insufficient documentation

## 2018-01-07 DIAGNOSIS — Z87891 Personal history of nicotine dependence: Secondary | ICD-10-CM | POA: Insufficient documentation

## 2018-01-07 DIAGNOSIS — Z8673 Personal history of transient ischemic attack (TIA), and cerebral infarction without residual deficits: Secondary | ICD-10-CM | POA: Insufficient documentation

## 2018-01-07 DIAGNOSIS — Z7901 Long term (current) use of anticoagulants: Secondary | ICD-10-CM | POA: Insufficient documentation

## 2018-01-07 DIAGNOSIS — E119 Type 2 diabetes mellitus without complications: Secondary | ICD-10-CM | POA: Insufficient documentation

## 2018-01-07 DIAGNOSIS — I132 Hypertensive heart and chronic kidney disease with heart failure and with stage 5 chronic kidney disease, or end stage renal disease: Secondary | ICD-10-CM | POA: Insufficient documentation

## 2018-01-07 DIAGNOSIS — J449 Chronic obstructive pulmonary disease, unspecified: Secondary | ICD-10-CM | POA: Diagnosis not present

## 2018-01-07 DIAGNOSIS — Z992 Dependence on renal dialysis: Secondary | ICD-10-CM | POA: Diagnosis not present

## 2018-01-07 DIAGNOSIS — Z751 Person awaiting admission to adequate facility elsewhere: Secondary | ICD-10-CM | POA: Insufficient documentation

## 2018-01-07 DIAGNOSIS — Z742 Need for assistance at home and no other household member able to render care: Secondary | ICD-10-CM | POA: Diagnosis not present

## 2018-01-07 DIAGNOSIS — Z951 Presence of aortocoronary bypass graft: Secondary | ICD-10-CM | POA: Insufficient documentation

## 2018-01-07 DIAGNOSIS — I5022 Chronic systolic (congestive) heart failure: Secondary | ICD-10-CM | POA: Insufficient documentation

## 2018-01-07 DIAGNOSIS — Z794 Long term (current) use of insulin: Secondary | ICD-10-CM | POA: Diagnosis not present

## 2018-01-07 DIAGNOSIS — I2510 Atherosclerotic heart disease of native coronary artery without angina pectoris: Secondary | ICD-10-CM | POA: Insufficient documentation

## 2018-01-07 DIAGNOSIS — N2581 Secondary hyperparathyroidism of renal origin: Secondary | ICD-10-CM | POA: Diagnosis not present

## 2018-01-07 DIAGNOSIS — Z7982 Long term (current) use of aspirin: Secondary | ICD-10-CM | POA: Insufficient documentation

## 2018-01-07 DIAGNOSIS — D509 Iron deficiency anemia, unspecified: Secondary | ICD-10-CM | POA: Diagnosis not present

## 2018-01-07 DIAGNOSIS — D631 Anemia in chronic kidney disease: Secondary | ICD-10-CM | POA: Diagnosis not present

## 2018-01-07 DIAGNOSIS — R69 Illness, unspecified: Secondary | ICD-10-CM | POA: Diagnosis not present

## 2018-01-07 NOTE — ED Notes (Signed)
Patient verbalizes understanding of discharge instructions. Opportunity for questioning and answers were provided. Armband removed by staff, pt discharged from ED via [facility] to home.  °

## 2018-01-07 NOTE — Care Management (Signed)
2005 01/07/2018 Laurena Slimmer RN BSN,NCM  CM received call from patients spouse stating that patient was to return to [facility]. CM explained that patient was no longer at [facility] as per patient.  Spouse stated that arrangement were made by the facility to take patient back. CM contacted [facility] who concurred with spouse, however patient was still refusing to return and requesting that he remains home. Patient's spouse handed patient the phone, and patient emphatically stated "I  am staying in my house and a nurse will come and help me to get better from home" CM reminded patient that nurse from [facility] would contact him by phone tomorrow, patient verbalized understanding and teach back done.

## 2018-01-07 NOTE — Progress Notes (Deleted)
Cardiology Office Note:    Date:  01/07/2018   ID:  Ernest [name], DOB 1945-12-29, MRN [id number]  PCP:  Patient, [name]  Cardiologist:  Larae Grooms, MD  Referring MD: No ref. provider found   No chief complaint on file. ***  History of Present Illness:    Ernest [name] is a 72 y.o. male with a past medical history significant for CAD S/P CABG X4 and mitral valve repair 09/13/17, ESRD on HD MWF, chronic systolic heart failure, COPD, HTN, HLD, DM type II, seizures and history of CVA X5.   Patient had NSTEMI 09/08/17 with subsequent CABG x4 and mitral valve repair for severe MR on 09/13/17.  He had confusion postoperatively and has been residing in a skilled nursing facility since that time.  He had atrial flutter postoperatively and was started on amiodarone and Coumadin with goal INR of 2 per CT surgery.    [name] was seen in the ED on 3/23 after a fall without evidence of injury.  Since the patient is anticoagulated with warfarin a CT of the head was done and was unremarkable.  His INR was slightly elevated felt that no immediate intervention was needed and there was no active bleeding.  It was noted that the patient had left his skilled nursing facility [facility].  The ED provider stressed the value of getting rehabilitation to improve his strength and social work was consulted to assist in getting the patient back to the skilled nursing facility.  Patient was also seen for bleeding from his dialysis access site on 12/18/17.  INR was therapeutic at the time at 2.2.  Bleeding was controlled and he was discharged.  CHA2DS2/VAS Stroke Risk Score  7 (CHF, CAD, HTN, age, DM, stroke (2). Anticoagulated with warfarin. INR followed at SNF.  Atrial flutter            ?DCCV.   Patient had an INR of 1.9 on 12/11/17. Since that time he has had INR greater than 2, on 3/6, 3/12, 3/15, 3/23.    Past Medical History:  Diagnosis Date  . Abnormal stress test    s/p cath  November 2013 with modest disease involving the ostial left main, proximal LAD, proximal RCA - do not appear to be hemodynamically signficant; mild LV dysfunction  . Bacteremia   . Chronic systolic CHF (congestive heart failure) ([place])   . COPD (chronic obstructive pulmonary disease) ([place])   . Coronary artery disease    a. per cath report 2013, b. 09/13/2017-CABG X4 & Mitral valve repair  . Ejection fraction < 50%   . Erectile dysfunction    penile implant  . ESRD (end stage renal disease) ([place])    Started HD in [place] in 2009, ESRD was due to DM. Moved to [facility] in Dec 2009 and now gets dialysis at [facility] on a MWF schedule.     . Gout    Hx: of  . Hepatitis C   . Hyperlipidemia   . Hypertension   . Mitral valve disease    s/p Mitral repair 09/13/17  . Obesity   . Retinopathy   . Shortness of breath    Hx: of with exertion  . Stroke ([place])   . Type II or unspecified type diabetes mellitus without mention of complication, not stated as uncontrolled    adult onset    Past Surgical History:  Procedure Laterality Date  . A/V FISTULAGRAM N/A 10/04/2017   Procedure: A/V FISTULAGRAM;  Surgeon: Elam Dutch, MD;  Location: [place] CV [facility];  Service: Cardiovascular;  Laterality: N/A;  . AV FISTULA PLACEMENT Left 01/13/2013   Procedure: INSERTION OF ARTERIOVENOUS (AV) GORE-TEX GRAFT ARM;  Surgeon: Angelia Mould, MD;  Location: [facility];  Service: Vascular;  Laterality: Left;  . AV FISTULA PLACEMENT Left 05/05/2013   Procedure: INSERTION OF LEFT UPPER ARM  ARTERIOVENOUS GORTEX GRAFT;  Surgeon: Angelia Mould, MD;  Location: [facility];  Service: Vascular;  Laterality: Left;  . [place] REMOVAL Left 03/26/2013   Procedure: REMOVAL OF  NON INCORPORATED ARTERIOVENOUS GORETEX GRAFT ([place]) left arm * repair of  left brachial artery with vein patch angioplasty.;  Surgeon: Angelia Mould, MD;  Location: [place];  Service: Vascular;  Laterality: Left;  . CARDIAC CATHETERIZATION   August 26, 2012  . CATARACT EXTRACTION W/ INTRAOCULAR LENS  IMPLANT, BILATERAL    . COLONOSCOPY    . CORONARY ARTERY BYPASS GRAFT N/A 09/13/2017   Procedure: CORONARY ARTERY BYPASS GRAFTING (CABG) times four LIMA  to LAD, SVG sequentially to OM and RAMUS Intermediate and SVG to Acute Marginal;  Surgeon: Melrose Nakayama, MD;  Location: [facility];  Service: Open Heart Surgery;  Laterality: N/A;  . EMBOLECTOMY Right 08/27/2013   Procedure: EMBOLECTOMY;  Surgeon: Mal Misty, MD;  Location: [place];  Service: Vascular;  Laterality: Right;  Thrombectomy of Radial and ulnar artery.  [name] EYE SURGERY     laser.  and surgery for DM  . fistula     RUE and wrist  . INSERTION OF DIALYSIS CATHETER Right 01/01/2013   Procedure: INSERTION OF DIALYSIS CATHETER right  internal jugular;  Surgeon: Serafina Mitchell, MD;  Location: [place];  Service: Vascular;  Laterality: Right;  . INSERTION OF DIALYSIS CATHETER N/A 03/26/2013   Procedure: INSERTION OF DIALYSIS CATHETER Left internal jugular vein;  Surgeon: Angelia Mould, MD;  Location: [place];  Service: Vascular;  Laterality: N/A;  . LEFT HEART CATH AND CORONARY ANGIOGRAPHY N/A 09/09/2017   Procedure: LEFT HEART CATH AND CORONARY ANGIOGRAPHY;  Surgeon: Troy Sine, MD;  Location: [place] CV LAB;  Service: Cardiovascular;  Laterality: N/A;  . LIGATION OF ARTERIOVENOUS  FISTULA Right 12/30/2013   Procedure: REMOVAL OF SEGMENT OF GORTEX GRAFT AND FISTULA  AND REPAIR OF BRACHIAL ARTERY;  Surgeon: Mal Misty, MD;  Location: [place];  Service: Vascular;  Laterality: Right;  . MITRAL VALVE REPAIR N/A 09/13/2017   Procedure: MITRAL VALVE REPAIR (MVR);  Surgeon: Melrose Nakayama, MD;  Location: [place];  Service: Open Heart Surgery;  Laterality: N/A;  . MITRAL VALVE REPAIR (MV)/CORONARY ARTERY BYPASS GRAFTING (CABG)  09/13/2017  . PENILE PROSTHESIS IMPLANT     1997.. no card  . PERIPHERAL VASCULAR BALLOON ANGIOPLASTY Left 10/04/2017   Procedure:  PERIPHERAL VASCULAR BALLOON ANGIOPLASTY;  Surgeon: Elam Dutch, MD;  Location: [place] CV [facility];  Service: Cardiovascular;  Laterality: Left;  AVF  . TEE WITHOUT CARDIOVERSION N/A 06/11/2013   Procedure: TRANSESOPHAGEAL ECHOCARDIOGRAM (TEE);  Surgeon: Larey Dresser, MD;  Location: [facility] ENDOSCOPY;  Service: Cardiovascular;  Laterality: N/A;  . TEE WITHOUT CARDIOVERSION N/A 09/13/2017   Procedure: TRANSESOPHAGEAL ECHOCARDIOGRAM (TEE);  Surgeon: Melrose Nakayama, MD;  Location: [facility];  Service: Open Heart Surgery;  Laterality: N/A;  . THROMBECTOMY W/ EMBOLECTOMY Left 03/26/2013   Procedure: Attempted thrombectomy of left arm arteriovenous goretex graft.;  Surgeon: Angelia Mould, MD;  Location: [place];  Service: Vascular;  Laterality:  Left;  . VENOGRAM N/A 04/07/2013   Procedure: VENOGRAM;  Surgeon: Serafina Mitchell, MD;  Location: [facility] CATH LAB;  Service: Cardiovascular;  Laterality: N/A;    Current Medications: No outpatient medications have been marked as taking for the 01/07/18 encounter (Appointment) with Daune Perch, NP.     Allergies:   Patient has no known allergies.   Social History   Socioeconomic History  . Marital status: Married    Spouse name: Not on file  . Number of children: Not on file  . Years of education: Not on file  . Highest education level: Not on file  Occupational History  . Occupation: Cabin crew: DISABLED    Comment: disabled  Social Needs  . Financial resource strain: Not on file  . Food insecurity:    Worry: Not on file    Inability: Not on file  . Transportation needs:    Medical: Not on file    Non-medical: Not on file  Tobacco Use  . Smoking status: Former Smoker    Years: 7.00    Last attempt to quit: 06/10/1973    Years since quitting: 44.6  . Smokeless tobacco: Never Used  . Tobacco comment: Quit in 1974.  Substance and Sexual Activity  . Alcohol use: No    Alcohol/week: 0.0 oz  . Drug use: No  . Sexual  activity: Not on file  Lifestyle  . Physical activity:    Days per week: Not on file    Minutes per session: Not on file  . Stress: Not on file  Relationships  . Social connections:    Talks on phone: Not on file    Gets together: Not on file    Attends religious service: Not on file    Active member of club or organization: Not on file    Attends meetings of clubs or organizations: Not on file    Relationship status: Not on file  Other Topics Concern  . Not on file  Social History Narrative   Adopted mentally retarded child at home, 2 adopted children, 3 living and 2 deceased.     Family History: The patient's ***family history includes Diabetes in his brother, sister, and son; Heart disease in his father. ROS:   Please see the history of present illness.    *** All other systems reviewed and are negative.  EKGs/Labs/Other Studies Reviewed:    The following studies were reviewed today: ***  EKG:  EKG is *** ordered today.  The ekg ordered today demonstrates ***  Recent Labs: 09/08/2017: B Natriuretic Peptide 4,125.9 09/20/2017: Magnesium 2.0 11/08/2017: ALT 14 12/18/2017: BUN 17; Creatinine, Ser 5.21; Hemoglobin 11.3; Platelets 227; Potassium 4.6; Sodium 139   Recent Lipid Panel    Component Value Date/Time   CHOL 116 10/28/2016 0442   TRIG 92 10/28/2016 0442   HDL 48 10/28/2016 0442   CHOLHDL 2.4 10/28/2016 0442   VLDL 18 10/28/2016 0442   LDLCALC 50 10/28/2016 0442    Physical Exam:    VS:  There were no vitals taken for this visit.    Wt Readings from Last 3 Encounters:  12/18/17 217 lb (98.4 kg)  12/09/17 205 lb 3.2 oz (93.1 kg)  12/06/17 205 lb 4.2 oz (93.1 kg)     Physical Exam***   ASSESSMENT:    No diagnosis found. PLAN:    In order of problems listed above:  1. ***   Medication Adjustments/Labs and Tests Ordered: Current medicines are  reviewed at length with the patient today.  Concerns regarding medicines are outlined above. Labs and  tests ordered and medication changes are outlined in the patient instructions below:  There are no Patient Instructions on file for this visit.   Signed, Daune Perch, NP  01/07/2018 7:57 AM    [facility]

## 2018-01-07 NOTE — ED Provider Notes (Signed)
[facility] EMERGENCY DEPARTMENT Provider Note   CSN: [id number] Arrival date & time: 01/07/18  1659     History   Chief Complaint Chief Complaint  Patient presents with  . Possible Nursing Home Placement    HPI Ernest [name] is a 72 y.o. male.  HPI  72 year old male with multiple comorbidities sent from his dialysis center by EMS for possible nursing home placement.  History is taken from patient and the nurse who is speaking to EMS.  The nurse reports that dialysis center told EMS that his house/home was not safe.  However they did not elaborate to EMS.  The patient received his full dialysis this afternoon.  He denies any current complaints including no fever, chest pain, shortness of breath, or pain anywhere.  [facility]  He is currently living at home and states his wife helps him.  He states he feels like he needs help such as a home health nurse and needs a bed that they can be raised and lowered.  However he is not interested in being admitted to a nursing home.  Past Medical History:  Diagnosis Date  . Abnormal stress test    s/p cath November 2013 with modest disease involving the ostial left main, proximal LAD, proximal RCA - do not appear to be hemodynamically signficant; mild LV dysfunction  . Bacteremia   . Chronic systolic CHF (congestive heart failure) ([place])   . COPD (chronic obstructive pulmonary disease) ([place])   . Coronary artery disease    a. per cath report 2013, b. 09/13/2017-CABG X4 & Mitral valve repair  . Ejection fraction < 50%   . Erectile dysfunction    penile implant  . ESRD (end stage renal disease) ([place])    Started HD in [place] in 2009, ESRD was due to DM. Moved to [facility] in Dec 2009 and now gets dialysis at [facility] on a MWF schedule.     . Gout    Hx: of  . Hepatitis C   . Hyperlipidemia   . Hypertension   . Mitral valve disease    s/p Mitral repair 09/13/17  . Obesity   . Retinopathy   .  Shortness of breath    Hx: of with exertion  . Stroke ([place])   . Type II or unspecified type diabetes mellitus without mention of complication, not stated as uncontrolled    adult onset    Patient Active Problem List   Diagnosis Date Noted  . Vascular dementia 10/10/2017  . Paroxysmal atrial flutter ([place]) 10/10/2017  . S/P CABG x 4 10/04/2017  . Cerebral embolism with cerebral infarction 09/20/2017  . NSVT (nonsustained ventricular tachycardia) ([place])   . Dyspnea 09/08/2017  . Non-ST elevation MI (NSTEMI) ([place])   . Acute on chronic combined systolic and diastolic heart failure ([place])   . End-stage renal disease on hemodialysis ([place])   . Left leg weakness   . TIA (transient ischemic attack) 10/27/2016  . History of CVA with residual deficit 08/07/2015  . Seizure, late effect of stroke ([place]) 08/07/2015  . Diabetic peripheral neuropathy ([place]) 08/07/2015  . Obstructive sleep apnea 06/16/2013  . Hyperlipidemia associated with type 2 diabetes mellitus ([place])   . ESRD (end stage renal disease) ([place])   . Erectile dysfunction associated with type 2 diabetes mellitus ([place])   . Hypertension due to end stage renal disease caused by type 2 diabetes mellitus, on dialysis ([place])   . COPD (chronic obstructive  pulmonary disease) ([facility] [place])   . Coronary artery disease   . Chronic combined systolic and diastolic CHF (congestive heart failure) ([place])   . Gout, unspecified 02/28/2009  . Uncontrolled type 2 diabetes mellitus with end-stage renal disease ([place]) 06/08/1984    Past Surgical History:  Procedure Laterality Date  . A/V FISTULAGRAM N/A 10/04/2017   Procedure: A/V FISTULAGRAM;  Surgeon: Elam Dutch, MD;  Location: [facility] CV [facility];  Service: Cardiovascular;  Laterality: N/A;  . AV FISTULA PLACEMENT Left 01/13/2013   Procedure: INSERTION OF ARTERIOVENOUS (AV) GORE-TEX GRAFT ARM;  Surgeon: Angelia Mould, MD;  Location: [facility];  Service: Vascular;  Laterality: Left;  . AV FISTULA PLACEMENT  Left 05/05/2013   Procedure: INSERTION OF LEFT UPPER ARM  ARTERIOVENOUS GORTEX GRAFT;  Surgeon: Angelia Mould, MD;  Location: [place];  Service: Vascular;  Laterality: Left;  . [place] REMOVAL Left 03/26/2013   Procedure: REMOVAL OF  NON INCORPORATED ARTERIOVENOUS GORETEX GRAFT ([place]) left arm * repair of  left brachial artery with vein patch angioplasty.;  Surgeon: Angelia Mould, MD;  Location: [facility];  Service: Vascular;  Laterality: Left;  . CARDIAC CATHETERIZATION  August 26, 2012  . CATARACT EXTRACTION W/ INTRAOCULAR LENS  IMPLANT, BILATERAL    . COLONOSCOPY    . CORONARY ARTERY BYPASS GRAFT N/A 09/13/2017   Procedure: CORONARY ARTERY BYPASS GRAFTING (CABG) times four LIMA  to LAD, SVG sequentially to OM and RAMUS Intermediate and SVG to Acute Marginal;  Surgeon: Melrose Nakayama, MD;  Location: [facility];  Service: Open Heart Surgery;  Laterality: N/A;  . EMBOLECTOMY Right 08/27/2013   Procedure: EMBOLECTOMY;  Surgeon: Mal Misty, MD;  Location: [facility];  Service: Vascular;  Laterality: Right;  Thrombectomy of Radial and ulnar artery.  [name] EYE SURGERY     laser.  and surgery for DM  . fistula     RUE and wrist  . INSERTION OF DIALYSIS CATHETER Right 01/01/2013   Procedure: INSERTION OF DIALYSIS CATHETER right  internal jugular;  Surgeon: Serafina Mitchell, MD;  Location: [facility];  Service: Vascular;  Laterality: Right;  . INSERTION OF DIALYSIS CATHETER N/A 03/26/2013   Procedure: INSERTION OF DIALYSIS CATHETER Left internal jugular vein;  Surgeon: Angelia Mould, MD;  Location: [facility];  Service: Vascular;  Laterality: N/A;  . LEFT HEART CATH AND CORONARY ANGIOGRAPHY N/A 09/09/2017   Procedure: LEFT HEART CATH AND CORONARY ANGIOGRAPHY;  Surgeon: Troy Sine, MD;  Location: [facility];  Service: Cardiovascular;  Laterality: N/A;  . LIGATION OF ARTERIOVENOUS  FISTULA Right 12/30/2013   Procedure: REMOVAL OF SEGMENT OF GORTEX GRAFT AND FISTULA  AND REPAIR OF BRACHIAL  ARTERY;  Surgeon: Mal Misty, MD;  Location: [facility];  Service: Vascular;  Laterality: Right;  . MITRAL VALVE REPAIR N/A 09/13/2017   Procedure: MITRAL VALVE REPAIR (MVR);  Surgeon: Melrose Nakayama, MD;  Location: [place] [facility];  Service: Open Heart Surgery;  Laterality: N/A;  . MITRAL VALVE REPAIR (MV)/CORONARY ARTERY BYPASS GRAFTING (CABG)  09/13/2017  . PENILE PROSTHESIS IMPLANT     1997.. no card  . PERIPHERAL VASCULAR BALLOON ANGIOPLASTY Left 10/04/2017   Procedure: PERIPHERAL VASCULAR BALLOON ANGIOPLASTY;  Surgeon: Elam Dutch, MD;  Location: [facility];  Service: Cardiovascular;  Laterality: Left;  AVF  . TEE WITHOUT CARDIOVERSION N/A 06/11/2013   Procedure: TRANSESOPHAGEAL ECHOCARDIOGRAM (TEE);  Surgeon: Larey Dresser, MD;  Location: [facility];  Service: Cardiovascular;  Laterality: N/A;  .  TEE WITHOUT CARDIOVERSION N/A 09/13/2017   Procedure: TRANSESOPHAGEAL ECHOCARDIOGRAM (TEE);  Surgeon: Melrose Nakayama, MD;  Location: [facility];  Service: Open Heart Surgery;  Laterality: N/A;  . THROMBECTOMY W/ EMBOLECTOMY Left 03/26/2013   Procedure: Attempted thrombectomy of left arm arteriovenous goretex graft.;  Surgeon: Angelia Mould, MD;  Location: [facility];  Service: Vascular;  Laterality: Left;  [name] VENOGRAM N/A 04/07/2013   Procedure: VENOGRAM;  Surgeon: Serafina Mitchell, MD;  Location: [facility] CATH LAB;  Service: Cardiovascular;  Laterality: N/A;        Home Medications    Prior to Admission medications   Medication Sig Start Date End Date Taking? Authorizing Provider  amiodarone (PACERONE) 200 MG tablet Take 1 tablet (200 mg total) by mouth 2 (two) times daily. Patient not taking: Reported on 01/04/2018 10/07/17   Anvith Giovanni, [name]  aspirin EC 81 MG tablet Take 1 tablet (81 mg total) by mouth daily. Patient not taking: Reported on 01/04/2018 11/29/15   Rivet, Sindy Guadeloupe, MD  atorvastatin (LIPITOR) 40 MG tablet Take 40 mg by mouth daily.     [provider]  B  Complex-C-Folic Acid (NEPHRO-VITE PO) Take 1 tablet by mouth daily.    [provider]  calcium acetate (PHOSLO) 667 MG capsule Take 667 mg by mouth 3 (three) times daily with meals.     [provider]  cinacalcet (SENSIPAR) 30 MG tablet Take 4 tablets (120 mg total) by mouth daily with supper. Patient not taking: Reported on 01/04/2018 10/07/17   Loreto Giovanni, [name]  Darbepoetin Alfa (ARANESP) 100 MCG/0.5ML SOSY injection Inject 100 mcg into the skin every 7 (seven) days. Administered with dialysis    [provider]  doxercalciferol (HECTOROL) 4 MCG/2ML injection Inject 2 mcg into the vein every Monday, Wednesday, and Friday with hemodialysis. Administered at dialysis    [provider]  ferric citrate (AURYXIA) 1 GM 210 MG(Fe) tablet Take 420 mg by mouth 3 (three) times daily with meals.     [provider]  ferric gluconate 125 mg in sodium chloride 0.9 % 100 mL Inject 250 mg into the vein every Monday, Wednesday, and Friday with hemodialysis.     [provider]  insulin detemir (LEVEMIR) 100 UNIT/ML injection Inject 0.2 mLs (20 Units total) into the skin 2 (two) times daily. Patient not taking: Reported on 01/04/2018 10/07/17   Jadene Pierini E, [name]  levETIRAcetam (KEPPRA) 500 MG tablet Take 2 tablets (1,000 mg total) by mouth daily. Then take extra 500 mg (1 tablet) on MWF after HD Patient not taking: Reported on 01/04/2018 11/29/15   Rivet, Sindy Guadeloupe, MD  linaclotide (LINZESS) 145 MCG CAPS capsule Take 145 mcg by mouth daily.    [provider]  metoprolol tartrate (LOPRESSOR) 50 MG tablet Take 50 mg by mouth daily. Take one-half tablet qd    [provider]  Nutritional Supplements (FEEDING SUPPLEMENT, NEPRO CARB STEADY,) LIQD Take 237 mLs by mouth 2 (two) times daily between meals. Patient not taking: Reported on 01/04/2018 10/07/17   Jadene Pierini E, [name]  sennosides-docusate sodium (SENOKOT-S) 8.6-50 MG tablet Take 2 tablets by  mouth 2 (two) times daily.    [provider]  sertraline (ZOLOFT) 25 MG tablet Take 25 mg by mouth at bedtime.    [provider]  warfarin (COUMADIN) 4 MG tablet Take 4 mg by mouth daily.    [provider]    Family History Family History  Problem Relation Age  of Onset  . Heart disease Father   . Diabetes Sister   . Diabetes Brother   . Diabetes Son     Social History Social History   Tobacco Use  . Smoking status: Former Smoker    Years: 7.00    Last attempt to quit: 06/10/1973    Years since quitting: 44.6  . Smokeless tobacco: Never Used  . Tobacco comment: Quit in 1974.  Substance Use Topics  . Alcohol use: No    Alcohol/week: 0.0 oz  . Drug use: No     Allergies   Patient has no known allergies.   Review of Systems Review of Systems  Constitutional: Negative for fever.  Respiratory: Negative for shortness of breath.   Cardiovascular: Negative for chest pain.  Gastrointestinal: Negative for abdominal pain and vomiting.  All other systems reviewed and are negative.    Physical Exam Updated Vital Signs BP 126/75   Pulse 79   Temp 98.2 F (36.8 C) (Oral)   Resp 16   Ht 5\' 9"  (1.753 m)   Wt 98.4 kg (217 lb)   SpO2 90%   BMI 32.05 kg/m   Physical Exam  Constitutional: He is oriented to person, place, and time. He appears well-developed and well-nourished. No distress.  HENT:  Head: Normocephalic and atraumatic.  Right Ear: External ear normal.  Left Ear: External ear normal.  Nose: Nose normal.  Eyes: Right eye exhibits no discharge. Left eye exhibits no discharge.  Neck: Neck supple.  Cardiovascular: Normal rate, regular rhythm and normal heart sounds.  Pulmonary/Chest: Effort normal and breath sounds normal.  Abdominal: Soft. He exhibits no distension. There is no tenderness.  Musculoskeletal: He exhibits no edema.  Left upper arm fistula in place, bandaged from recent dialysis  Neurological: He is alert and  oriented to person, place, and time.  Skin: Skin is warm and dry. He is not diaphoretic.  Nursing note and vitals reviewed.    ED Treatments / Results  Labs (all labs ordered are listed, but only abnormal results are displayed) Labs Reviewed - No data to display  EKG None  Radiology No results found.  Procedures Procedures (including critical care time)  Medications Ordered in ED Medications - No data to display   Initial Impression / Assessment and Plan / ED Course  I have reviewed the triage vital signs and the nursing notes.  Pertinent labs & imaging results that were available during my care of the patient were reviewed by me and considered in my medical decision making (see chart for details).     Patient has no acute complaints besides needing home health help.  Vital signs are unremarkable.  I do not think lab work would be indicated given he just had dialysis and has no other complaints or signs of an illness.  Case manager was involved and has arranged for home health and a bed at home.  This will arrive tomorrow.  He otherwise appears stable for discharge home.  No indication of an emergent condition.  Final Clinical Impressions(s) / ED Diagnoses   Final diagnoses:  Need for home health care    ED Discharge Orders    None       Sherwood Gambler, MD 01/07/18 1901

## 2018-01-07 NOTE — ED Triage Notes (Signed)
Pt arrived EMS from dialysis where per EMS the nurse at his treatment center said his home isn't safe and that he needs evaluation for possible placment. He was previously at [facility] but pt does not want to go back there. Pt has no current complaints BP 146/82 P82 [id number] with EMS

## 2018-01-07 NOTE — Progress Notes (Addendum)
Called [facility] for transport. [facility] called for safety reasons. Pt declined SNF and wants to go with [facility] per CM.   Wendelyn Breslow, [facility]  [phone]

## 2018-01-07 NOTE — Care Management (Signed)
ED CM received consult from Dr. Regenia Skeeter concerning patient being sent from HD today in need of placement. CM met with patient at bedside he reports leaving [facility]  refused to return. He states that he can get well at home with therapy and a hospital bed. CM discussed possibility of [facility] patient is agreeable. CM contacted [name] from [facility] he has agreed to accept patient. CM updated Dr. Regenia Skeeter EDP who agrees with transitional care plan. Orders placed and faxed to [facility], orders for hospital bed and 3 n1 chair has also been faxed to [facility] as per patient selection. Patient will be transported home by [facility]. No further CM needs identified.

## 2018-01-08 ENCOUNTER — Encounter: Payer: Self-pay | Admitting: Cardiology

## 2018-01-08 DIAGNOSIS — N2581 Secondary hyperparathyroidism of renal origin: Secondary | ICD-10-CM | POA: Diagnosis not present

## 2018-01-08 DIAGNOSIS — E119 Type 2 diabetes mellitus without complications: Secondary | ICD-10-CM | POA: Diagnosis not present

## 2018-01-08 DIAGNOSIS — N186 End stage renal disease: Secondary | ICD-10-CM | POA: Diagnosis not present

## 2018-01-08 DIAGNOSIS — D509 Iron deficiency anemia, unspecified: Secondary | ICD-10-CM | POA: Diagnosis not present

## 2018-01-08 DIAGNOSIS — D631 Anemia in chronic kidney disease: Secondary | ICD-10-CM | POA: Diagnosis not present

## 2018-01-09 DIAGNOSIS — I132 Hypertensive heart and chronic kidney disease with heart failure and with stage 5 chronic kidney disease, or end stage renal disease: Secondary | ICD-10-CM | POA: Diagnosis not present

## 2018-01-09 DIAGNOSIS — I5043 Acute on chronic combined systolic (congestive) and diastolic (congestive) heart failure: Secondary | ICD-10-CM | POA: Diagnosis not present

## 2018-01-09 DIAGNOSIS — N186 End stage renal disease: Secondary | ICD-10-CM | POA: Diagnosis not present

## 2018-01-09 DIAGNOSIS — I5032 Chronic diastolic (congestive) heart failure: Secondary | ICD-10-CM | POA: Diagnosis not present

## 2018-01-09 DIAGNOSIS — E1122 Type 2 diabetes mellitus with diabetic chronic kidney disease: Secondary | ICD-10-CM | POA: Diagnosis not present

## 2018-01-10 DIAGNOSIS — D631 Anemia in chronic kidney disease: Secondary | ICD-10-CM | POA: Diagnosis not present

## 2018-01-10 DIAGNOSIS — N2581 Secondary hyperparathyroidism of renal origin: Secondary | ICD-10-CM | POA: Diagnosis not present

## 2018-01-10 DIAGNOSIS — E119 Type 2 diabetes mellitus without complications: Secondary | ICD-10-CM | POA: Diagnosis not present

## 2018-01-10 DIAGNOSIS — D509 Iron deficiency anemia, unspecified: Secondary | ICD-10-CM | POA: Diagnosis not present

## 2018-01-10 DIAGNOSIS — N186 End stage renal disease: Secondary | ICD-10-CM | POA: Diagnosis not present

## 2018-01-11 DIAGNOSIS — I132 Hypertensive heart and chronic kidney disease with heart failure and with stage 5 chronic kidney disease, or end stage renal disease: Secondary | ICD-10-CM | POA: Diagnosis not present

## 2018-01-11 DIAGNOSIS — I5043 Acute on chronic combined systolic (congestive) and diastolic (congestive) heart failure: Secondary | ICD-10-CM | POA: Diagnosis not present

## 2018-01-13 DIAGNOSIS — E119 Type 2 diabetes mellitus without complications: Secondary | ICD-10-CM | POA: Diagnosis not present

## 2018-01-13 DIAGNOSIS — N186 End stage renal disease: Secondary | ICD-10-CM | POA: Diagnosis not present

## 2018-01-13 DIAGNOSIS — Z992 Dependence on renal dialysis: Secondary | ICD-10-CM | POA: Diagnosis not present

## 2018-01-13 DIAGNOSIS — I5043 Acute on chronic combined systolic (congestive) and diastolic (congestive) heart failure: Secondary | ICD-10-CM | POA: Diagnosis not present

## 2018-01-13 DIAGNOSIS — D631 Anemia in chronic kidney disease: Secondary | ICD-10-CM | POA: Diagnosis not present

## 2018-01-13 DIAGNOSIS — N2581 Secondary hyperparathyroidism of renal origin: Secondary | ICD-10-CM | POA: Diagnosis not present

## 2018-01-13 DIAGNOSIS — D509 Iron deficiency anemia, unspecified: Secondary | ICD-10-CM | POA: Diagnosis not present

## 2018-01-13 DIAGNOSIS — E1129 Type 2 diabetes mellitus with other diabetic kidney complication: Secondary | ICD-10-CM | POA: Diagnosis not present

## 2018-01-13 DIAGNOSIS — I132 Hypertensive heart and chronic kidney disease with heart failure and with stage 5 chronic kidney disease, or end stage renal disease: Secondary | ICD-10-CM | POA: Diagnosis not present

## 2018-01-14 DIAGNOSIS — I132 Hypertensive heart and chronic kidney disease with heart failure and with stage 5 chronic kidney disease, or end stage renal disease: Secondary | ICD-10-CM | POA: Diagnosis not present

## 2018-01-14 DIAGNOSIS — I5043 Acute on chronic combined systolic (congestive) and diastolic (congestive) heart failure: Secondary | ICD-10-CM | POA: Diagnosis not present

## 2018-01-15 DIAGNOSIS — G4733 Obstructive sleep apnea (adult) (pediatric): Secondary | ICD-10-CM | POA: Diagnosis not present

## 2018-01-15 DIAGNOSIS — E119 Type 2 diabetes mellitus without complications: Secondary | ICD-10-CM | POA: Diagnosis not present

## 2018-01-15 DIAGNOSIS — G40909 Epilepsy, unspecified, not intractable, without status epilepticus: Secondary | ICD-10-CM | POA: Diagnosis not present

## 2018-01-15 DIAGNOSIS — I4892 Unspecified atrial flutter: Secondary | ICD-10-CM | POA: Diagnosis not present

## 2018-01-15 DIAGNOSIS — N186 End stage renal disease: Secondary | ICD-10-CM | POA: Diagnosis not present

## 2018-01-15 DIAGNOSIS — Z0000 Encounter for general adult medical examination without abnormal findings: Secondary | ICD-10-CM | POA: Diagnosis not present

## 2018-01-15 DIAGNOSIS — F0150 Vascular dementia without behavioral disturbance: Secondary | ICD-10-CM | POA: Diagnosis not present

## 2018-01-15 DIAGNOSIS — I6350 Cerebral infarction due to unspecified occlusion or stenosis of unspecified cerebral artery: Secondary | ICD-10-CM | POA: Diagnosis not present

## 2018-01-15 DIAGNOSIS — I509 Heart failure, unspecified: Secondary | ICD-10-CM | POA: Diagnosis not present

## 2018-01-15 DIAGNOSIS — E1165 Type 2 diabetes mellitus with hyperglycemia: Secondary | ICD-10-CM | POA: Diagnosis not present

## 2018-01-15 DIAGNOSIS — I1310 Hypertensive heart and chronic kidney disease without heart failure, with stage 1 through stage 4 chronic kidney disease, or unspecified chronic kidney disease: Secondary | ICD-10-CM | POA: Diagnosis not present

## 2018-01-15 DIAGNOSIS — N2581 Secondary hyperparathyroidism of renal origin: Secondary | ICD-10-CM | POA: Diagnosis not present

## 2018-01-15 DIAGNOSIS — D509 Iron deficiency anemia, unspecified: Secondary | ICD-10-CM | POA: Diagnosis not present

## 2018-01-15 DIAGNOSIS — J449 Chronic obstructive pulmonary disease, unspecified: Secondary | ICD-10-CM | POA: Diagnosis not present

## 2018-01-15 DIAGNOSIS — D631 Anemia in chronic kidney disease: Secondary | ICD-10-CM | POA: Diagnosis not present

## 2018-01-16 DIAGNOSIS — I132 Hypertensive heart and chronic kidney disease with heart failure and with stage 5 chronic kidney disease, or end stage renal disease: Secondary | ICD-10-CM | POA: Diagnosis not present

## 2018-01-16 DIAGNOSIS — I5043 Acute on chronic combined systolic (congestive) and diastolic (congestive) heart failure: Secondary | ICD-10-CM | POA: Diagnosis not present

## 2018-01-17 ENCOUNTER — Encounter: Payer: Self-pay | Admitting: Nephrology

## 2018-01-17 ENCOUNTER — Telehealth: Payer: Self-pay | Admitting: Cardiology

## 2018-01-17 DIAGNOSIS — D631 Anemia in chronic kidney disease: Secondary | ICD-10-CM | POA: Diagnosis not present

## 2018-01-17 DIAGNOSIS — N186 End stage renal disease: Secondary | ICD-10-CM | POA: Diagnosis not present

## 2018-01-17 DIAGNOSIS — N2581 Secondary hyperparathyroidism of renal origin: Secondary | ICD-10-CM | POA: Diagnosis not present

## 2018-01-17 DIAGNOSIS — E119 Type 2 diabetes mellitus without complications: Secondary | ICD-10-CM | POA: Diagnosis not present

## 2018-01-17 DIAGNOSIS — D509 Iron deficiency anemia, unspecified: Secondary | ICD-10-CM | POA: Diagnosis not present

## 2018-01-17 MED ORDER — AMIODARONE HCL 200 MG PO TABS: 200.0000 mg | ORAL_TABLET | Freq: Two times a day (BID) | ORAL | 11 refills | Status: DC

## 2018-01-17 MED ORDER — AMIODARONE HCL 200 MG PO TABS: ORAL_TABLET | ORAL | 0 refills | Status: DC

## 2018-01-17 NOTE — Telephone Encounter (Signed)
Attempted to contact patient's wife but there was no answer. Left VM on both the home phone and cell phone for her to call back.   Discussed with Dr. Irish Lack and since the patient has not been taking the amiodarone he should take amio 400 mg BID x 2 weeks and then resume taking 200 mg BID. As long as the patient is not having any other symptoms he should keep appointment with NH on 4/23 with and EKG at that time.   Updated Rx for amiodarone.

## 2018-01-17 NOTE — Telephone Encounter (Signed)
New Message:    STAT if HR is under 50 or over 120 (normal HR is 60-100 beats per minute)  1) What is your heart rate? 120  2) Do you have a log of your heart rate readings (document readings)?   3) Do you have any other symptoms?

## 2018-01-17 NOTE — Telephone Encounter (Signed)
Scheduling called stating a PA is on the phone to discuss treatment of a patient. [facility] PA Lavona Mound) called to report the patient's HR is in the 120s during dialysis. After a series of questions she found he is not taking his amiodarone because it was not filled by [facility]. She does not know how long he has not been taking. He is not having any issues other than high HR. Agreed with her a refill would be sent to [facility] and his appointment would be moved up (he is currently scheduled 4/23). Informed her the patient's wife would be notified (PA reports the patient cannot take direction himself). She was grateful for assistance. Phone [phone] if questions.    Called to review instructions with patient's wife. Left message to call back. Amiodarone refill called in.

## 2018-01-18 DIAGNOSIS — I132 Hypertensive heart and chronic kidney disease with heart failure and with stage 5 chronic kidney disease, or end stage renal disease: Secondary | ICD-10-CM | POA: Diagnosis not present

## 2018-01-18 DIAGNOSIS — I5043 Acute on chronic combined systolic (congestive) and diastolic (congestive) heart failure: Secondary | ICD-10-CM | POA: Diagnosis not present

## 2018-01-19 DIAGNOSIS — J449 Chronic obstructive pulmonary disease, unspecified: Secondary | ICD-10-CM | POA: Diagnosis not present

## 2018-01-19 DIAGNOSIS — R569 Unspecified convulsions: Secondary | ICD-10-CM | POA: Diagnosis not present

## 2018-01-20 DIAGNOSIS — D631 Anemia in chronic kidney disease: Secondary | ICD-10-CM | POA: Diagnosis not present

## 2018-01-20 DIAGNOSIS — D509 Iron deficiency anemia, unspecified: Secondary | ICD-10-CM | POA: Diagnosis not present

## 2018-01-20 DIAGNOSIS — N2581 Secondary hyperparathyroidism of renal origin: Secondary | ICD-10-CM | POA: Diagnosis not present

## 2018-01-20 DIAGNOSIS — N186 End stage renal disease: Secondary | ICD-10-CM | POA: Diagnosis not present

## 2018-01-20 DIAGNOSIS — E119 Type 2 diabetes mellitus without complications: Secondary | ICD-10-CM | POA: Diagnosis not present

## 2018-01-21 DIAGNOSIS — I5043 Acute on chronic combined systolic (congestive) and diastolic (congestive) heart failure: Secondary | ICD-10-CM | POA: Diagnosis not present

## 2018-01-21 DIAGNOSIS — I132 Hypertensive heart and chronic kidney disease with heart failure and with stage 5 chronic kidney disease, or end stage renal disease: Secondary | ICD-10-CM | POA: Diagnosis not present

## 2018-01-21 NOTE — Telephone Encounter (Signed)
Spoke with [name] from [facility] to clarify the Amiodarone prescription..  She verbalized understanding and will further investigate the Warfarin dosage.Ernest [name]

## 2018-01-21 NOTE — Telephone Encounter (Signed)
New Message  Pt c/o medication issue:  1. Name of Medication: Amiodarone   2. How are you currently taking this medication (dosage and times per day)? n/a  3. Are you having a reaction (difficulty breathing--STAT)? no  4. What is your medication issue? Christie from [employer] is calling about the pt;s amiodarone and  states that an internees wrote a prescription for warfarin until he situation with the amiodarone is addressed. Please call

## 2018-01-22 DIAGNOSIS — N2581 Secondary hyperparathyroidism of renal origin: Secondary | ICD-10-CM | POA: Diagnosis not present

## 2018-01-22 DIAGNOSIS — N186 End stage renal disease: Secondary | ICD-10-CM | POA: Diagnosis not present

## 2018-01-22 DIAGNOSIS — D509 Iron deficiency anemia, unspecified: Secondary | ICD-10-CM | POA: Diagnosis not present

## 2018-01-22 DIAGNOSIS — E119 Type 2 diabetes mellitus without complications: Secondary | ICD-10-CM | POA: Diagnosis not present

## 2018-01-22 DIAGNOSIS — D631 Anemia in chronic kidney disease: Secondary | ICD-10-CM | POA: Diagnosis not present

## 2018-01-23 DIAGNOSIS — I132 Hypertensive heart and chronic kidney disease with heart failure and with stage 5 chronic kidney disease, or end stage renal disease: Secondary | ICD-10-CM | POA: Diagnosis not present

## 2018-01-23 DIAGNOSIS — I5043 Acute on chronic combined systolic (congestive) and diastolic (congestive) heart failure: Secondary | ICD-10-CM | POA: Diagnosis not present

## 2018-01-24 DIAGNOSIS — D631 Anemia in chronic kidney disease: Secondary | ICD-10-CM | POA: Diagnosis not present

## 2018-01-24 DIAGNOSIS — N186 End stage renal disease: Secondary | ICD-10-CM | POA: Diagnosis not present

## 2018-01-24 DIAGNOSIS — D509 Iron deficiency anemia, unspecified: Secondary | ICD-10-CM | POA: Diagnosis not present

## 2018-01-24 DIAGNOSIS — E119 Type 2 diabetes mellitus without complications: Secondary | ICD-10-CM | POA: Diagnosis not present

## 2018-01-24 DIAGNOSIS — N2581 Secondary hyperparathyroidism of renal origin: Secondary | ICD-10-CM | POA: Diagnosis not present

## 2018-01-24 NOTE — Telephone Encounter (Signed)
Spoke with Adonis Brook from [employer] [facility] [employer] and made sure that she had everything that she needed regarding the patient's prescriptions. She states that she has been coordinating with [facility] on [address] about his Rx for amiodarone. [facility] was initially unable to fill the Rx because [facility] had a hold on the Rx. She states that she will reach out to [facility] and let them know that they can fill the Rx. Adonis Brook denies any further needs at this time.  Called and spoke to the patient's wife (DPR on file) and made her aware that [facility] was coordinating with [facility] on [address] for his Rx for [employer] so that she can pick it up locally and get it started. She is aware that the Rx will be for  amiodarone 200 mg tablets and that the patient will take 2 tablets BID for 2 weeks and then go back to his normal dose of 1 tablet BID. Instructed wife to call [facility] a little later to see if they have the Rx ready and instructed her to let me know if she has any issues. She verbalized understanding and thanked me for the call.

## 2018-01-27 DIAGNOSIS — D509 Iron deficiency anemia, unspecified: Secondary | ICD-10-CM | POA: Diagnosis not present

## 2018-01-27 DIAGNOSIS — N186 End stage renal disease: Secondary | ICD-10-CM | POA: Diagnosis not present

## 2018-01-27 DIAGNOSIS — E119 Type 2 diabetes mellitus without complications: Secondary | ICD-10-CM | POA: Diagnosis not present

## 2018-01-27 DIAGNOSIS — D631 Anemia in chronic kidney disease: Secondary | ICD-10-CM | POA: Diagnosis not present

## 2018-01-27 DIAGNOSIS — N2581 Secondary hyperparathyroidism of renal origin: Secondary | ICD-10-CM | POA: Diagnosis not present

## 2018-01-28 DIAGNOSIS — I5043 Acute on chronic combined systolic (congestive) and diastolic (congestive) heart failure: Secondary | ICD-10-CM | POA: Diagnosis not present

## 2018-01-28 DIAGNOSIS — I132 Hypertensive heart and chronic kidney disease with heart failure and with stage 5 chronic kidney disease, or end stage renal disease: Secondary | ICD-10-CM | POA: Diagnosis not present

## 2018-01-29 DIAGNOSIS — N186 End stage renal disease: Secondary | ICD-10-CM | POA: Diagnosis not present

## 2018-01-29 DIAGNOSIS — D509 Iron deficiency anemia, unspecified: Secondary | ICD-10-CM | POA: Diagnosis not present

## 2018-01-29 DIAGNOSIS — N2581 Secondary hyperparathyroidism of renal origin: Secondary | ICD-10-CM | POA: Diagnosis not present

## 2018-01-29 DIAGNOSIS — D631 Anemia in chronic kidney disease: Secondary | ICD-10-CM | POA: Diagnosis not present

## 2018-01-29 DIAGNOSIS — E119 Type 2 diabetes mellitus without complications: Secondary | ICD-10-CM | POA: Diagnosis not present

## 2018-01-30 ENCOUNTER — Ambulatory Visit: Payer: Medicare Other | Admitting: Neurology

## 2018-01-30 DIAGNOSIS — I5043 Acute on chronic combined systolic (congestive) and diastolic (congestive) heart failure: Secondary | ICD-10-CM | POA: Diagnosis not present

## 2018-01-30 DIAGNOSIS — I132 Hypertensive heart and chronic kidney disease with heart failure and with stage 5 chronic kidney disease, or end stage renal disease: Secondary | ICD-10-CM | POA: Diagnosis not present

## 2018-01-31 DIAGNOSIS — N186 End stage renal disease: Secondary | ICD-10-CM | POA: Diagnosis not present

## 2018-01-31 DIAGNOSIS — D631 Anemia in chronic kidney disease: Secondary | ICD-10-CM | POA: Diagnosis not present

## 2018-01-31 DIAGNOSIS — E119 Type 2 diabetes mellitus without complications: Secondary | ICD-10-CM | POA: Diagnosis not present

## 2018-01-31 DIAGNOSIS — D509 Iron deficiency anemia, unspecified: Secondary | ICD-10-CM | POA: Diagnosis not present

## 2018-01-31 DIAGNOSIS — N2581 Secondary hyperparathyroidism of renal origin: Secondary | ICD-10-CM | POA: Diagnosis not present

## 2018-02-03 DIAGNOSIS — N186 End stage renal disease: Secondary | ICD-10-CM | POA: Diagnosis not present

## 2018-02-03 DIAGNOSIS — N2581 Secondary hyperparathyroidism of renal origin: Secondary | ICD-10-CM | POA: Diagnosis not present

## 2018-02-03 DIAGNOSIS — E119 Type 2 diabetes mellitus without complications: Secondary | ICD-10-CM | POA: Diagnosis not present

## 2018-02-03 DIAGNOSIS — D631 Anemia in chronic kidney disease: Secondary | ICD-10-CM | POA: Diagnosis not present

## 2018-02-03 DIAGNOSIS — D509 Iron deficiency anemia, unspecified: Secondary | ICD-10-CM | POA: Diagnosis not present

## 2018-02-04 ENCOUNTER — Encounter: Payer: Self-pay | Admitting: Cardiology

## 2018-02-04 ENCOUNTER — Ambulatory Visit (INDEPENDENT_AMBULATORY_CARE_PROVIDER_SITE_OTHER): Payer: Medicare Other | Admitting: Cardiology

## 2018-02-04 VITALS — BP 140/62 | HR 82 | Ht 69.0 in

## 2018-02-04 DIAGNOSIS — E1122 Type 2 diabetes mellitus with diabetic chronic kidney disease: Secondary | ICD-10-CM

## 2018-02-04 DIAGNOSIS — I2510 Atherosclerotic heart disease of native coronary artery without angina pectoris: Secondary | ICD-10-CM | POA: Diagnosis not present

## 2018-02-04 DIAGNOSIS — N186 End stage renal disease: Secondary | ICD-10-CM | POA: Diagnosis not present

## 2018-02-04 DIAGNOSIS — Z992 Dependence on renal dialysis: Secondary | ICD-10-CM | POA: Diagnosis not present

## 2018-02-04 DIAGNOSIS — I5042 Chronic combined systolic (congestive) and diastolic (congestive) heart failure: Secondary | ICD-10-CM

## 2018-02-04 DIAGNOSIS — I120 Hypertensive chronic kidney disease with stage 5 chronic kidney disease or end stage renal disease: Secondary | ICD-10-CM

## 2018-02-04 DIAGNOSIS — I5043 Acute on chronic combined systolic (congestive) and diastolic (congestive) heart failure: Secondary | ICD-10-CM | POA: Diagnosis not present

## 2018-02-04 DIAGNOSIS — I4892 Unspecified atrial flutter: Secondary | ICD-10-CM

## 2018-02-04 DIAGNOSIS — I6930 Unspecified sequelae of cerebral infarction: Secondary | ICD-10-CM | POA: Diagnosis not present

## 2018-02-04 DIAGNOSIS — I132 Hypertensive heart and chronic kidney disease with heart failure and with stage 5 chronic kidney disease, or end stage renal disease: Secondary | ICD-10-CM | POA: Diagnosis not present

## 2018-02-04 LAB — PROTIME-INR
INR: 1.4 — AB (ref 0.8–1.2)
PROTHROMBIN TIME: 13.9 s — AB (ref 9.1–12.0)

## 2018-02-04 MED ORDER — AMIODARONE HCL 200 MG PO TABS: 200.0000 mg | ORAL_TABLET | Freq: Every day | ORAL | 3 refills | Status: DC

## 2018-02-04 NOTE — Progress Notes (Signed)
Cardiology Office Note:    Date:  02/04/2018   ID:  Ernest [name], DOB [birth year], MRN [id number]  PCP:  Patient, [name]  Cardiologist:  Larae Grooms, MD  Referring MD: Daune Perch, NP   Chief Complaint  Patient presents with  . Follow-up    atrial flutter    History of Present Illness:    Ernest [name] is a 72 y.o. male with a past medical history significant for ESRD on hemodialysis M/W/F, paroxysmal atrial flutter, status post CABG x4 & mitral valve repair 09/13/17, NSVT, chronic combined systolic and diastolic heart failure, CVA, OSA, HLD, type 2 DM, HTN, COPD.   The patient was found to have a heart rate in the 120s yesterday during dialysis.  He had no symptoms from this.  It was noted that he had stopped taking his amiodarone due to it not being filled by his pharmacy.  It was not known how long he had been without it. He has a history of CAD and MVRepair in 08/2017 and was at [facility] postoperatively, however he left there AMA. There was a recent ED visit on 01/07/18 for concerns of inadequate living situation. The patient declined SNF placement and requested home health assistance and a hospital bed.    Prescription was sent in yesterday for amiodarone 400 mg twice daily times 2 weeks and then resume 200 mg twice daily.  When I last saw him in the office on 11/28/17 the patient was in atrial flutter with good rate control and was asymptomatic.  His INR was not therapeutic and after consultation with Dr. Radford Pax we recommended working toward therapeutic INR and try for DCCV after 3 weeks of therapeutic levels.  Instructions were sent to the skilled nursing facility.  At some point the patient left the facility.  The patient is here today alone and unable to provide any meaning information about his treatment or medications. He has altered mental capacity. He is in a wheelchair and got her by bus. He denies any chest discomfort, palpitations, lightheadedness or  shortness of breath. I tried all day to get hold of his wife and finally got to talk to her this afternoon by phone. She says that the patient has been taking his medications including warfarin. She says that his blood has been monitored by the home health nurse from [place] (comes out 2 times per week), but she cannot tell me the provider that is managing the dosing. She is a little hard to obtain information from. I have asked her to have the [facility] nurse send Ernest [name] the INR readings as we need to know that he has been therapeutic for at least 3-4 weeks so that we can proceed with the cardioversion. She says that she will tell the nurse.   The patient actually looks quite good today. His wife says that she is taking better care of him than what he got at the SNF. He has only trace lower extremity edema and lungs are clear.    Past Medical History:  Diagnosis Date  . Abnormal stress test    s/p cath November 2013 with modest disease involving the ostial left main, proximal LAD, proximal RCA - do not appear to be hemodynamically signficant; mild LV dysfunction  . Bacteremia   . Chronic systolic CHF (congestive heart failure) ([place])   . COPD (chronic obstructive pulmonary disease) ([place])   . Coronary artery disease    a. per cath report 2013, b. 09/13/2017-CABG X4 & Mitral  valve repair  . Ejection fraction < 50%   . Erectile dysfunction    penile implant  . ESRD (end stage renal disease) ([place])    Started HD in [place] in 2009, ESRD was due to DM. Moved to [facility] in Dec 2009 and now gets dialysis at [facility] on a MWF schedule.     . Gout    Hx: of  . Hepatitis C   . Hyperlipidemia   . Hypertension   . Mitral valve disease    s/p Mitral repair 09/13/17  . Obesity   . Retinopathy   . Shortness of breath    Hx: of with exertion  . Stroke ([place])   . Type II or unspecified type diabetes mellitus without mention of complication, not stated as uncontrolled    adult onset    Past Surgical History:    Procedure Laterality Date  . A/V FISTULAGRAM N/A 10/04/2017   Procedure: A/V FISTULAGRAM;  Surgeon: Elam Dutch, MD;  Location: [place] CV LAB;  Service: Cardiovascular;  Laterality: N/A;  . AV FISTULA PLACEMENT Left 01/13/2013   Procedure: INSERTION OF ARTERIOVENOUS (AV) GORE-TEX GRAFT ARM;  Surgeon: Angelia Mould, MD;  Location: [place];  Service: Vascular;  Laterality: Left;  . AV FISTULA PLACEMENT Left 05/05/2013   Procedure: INSERTION OF LEFT UPPER ARM  ARTERIOVENOUS GORTEX GRAFT;  Surgeon: Angelia Mould, MD;  Location: [place];  Service: Vascular;  Laterality: Left;  . [place] REMOVAL Left 03/26/2013   Procedure: REMOVAL OF  NON INCORPORATED ARTERIOVENOUS GORETEX GRAFT ([place]) left arm * repair of  left brachial artery with vein patch angioplasty.;  Surgeon: Angelia Mould, MD;  Location: [place];  Service: Vascular;  Laterality: Left;  . CARDIAC CATHETERIZATION  August 26, 2012  . CATARACT EXTRACTION W/ INTRAOCULAR LENS  IMPLANT, BILATERAL    . COLONOSCOPY    . CORONARY ARTERY BYPASS GRAFT N/A 09/13/2017   Procedure: CORONARY ARTERY BYPASS GRAFTING (CABG) times four LIMA  to LAD, SVG sequentially to OM and RAMUS Intermediate and SVG to Acute Marginal;  Surgeon: Melrose Nakayama, MD;  Location: [place];  Service: Open Heart Surgery;  Laterality: N/A;  . EMBOLECTOMY Right 08/27/2013   Procedure: EMBOLECTOMY;  Surgeon: Mal Misty, MD;  Location: [place];  Service: Vascular;  Laterality: Right;  Thrombectomy of Radial and ulnar artery.  [name] EYE SURGERY     laser.  and surgery for DM  . fistula     RUE and wrist  . INSERTION OF DIALYSIS CATHETER Right 01/01/2013   Procedure: INSERTION OF DIALYSIS CATHETER right  internal jugular;  Surgeon: Serafina Mitchell, MD;  Location: [place];  Service: Vascular;  Laterality: Right;  . INSERTION OF DIALYSIS CATHETER N/A 03/26/2013   Procedure: INSERTION OF DIALYSIS CATHETER Left internal jugular vein;  Surgeon: Angelia Mould,  MD;  Location: [place];  Service: Vascular;  Laterality: N/A;  . LEFT HEART CATH AND CORONARY ANGIOGRAPHY N/A 09/09/2017   Procedure: LEFT HEART CATH AND CORONARY ANGIOGRAPHY;  Surgeon: Troy Sine, MD;  Location: [place] CV LAB;  Service: Cardiovascular;  Laterality: N/A;  . LIGATION OF ARTERIOVENOUS  FISTULA Right 12/30/2013   Procedure: REMOVAL OF SEGMENT OF GORTEX GRAFT AND FISTULA  AND REPAIR OF BRACHIAL ARTERY;  Surgeon: Mal Misty, MD;  Location: [place];  Service: Vascular;  Laterality: Right;  . MITRAL VALVE REPAIR N/A 09/13/2017   Procedure: MITRAL VALVE REPAIR (MVR);  Surgeon: Melrose Nakayama, MD;  Location: [facility];  Service: Open Heart Surgery;  Laterality: N/A;  . MITRAL VALVE REPAIR (MV)/CORONARY ARTERY BYPASS GRAFTING (CABG)  09/13/2017  . PENILE PROSTHESIS IMPLANT     1997.. no card  . PERIPHERAL VASCULAR BALLOON ANGIOPLASTY Left 10/04/2017   Procedure: PERIPHERAL VASCULAR BALLOON ANGIOPLASTY;  Surgeon: Elam Dutch, MD;  Location: [place] CV LAB;  Service: Cardiovascular;  Laterality: Left;  AVF  . TEE WITHOUT CARDIOVERSION N/A 06/11/2013   Procedure: TRANSESOPHAGEAL ECHOCARDIOGRAM (TEE);  Surgeon: Larey Dresser, MD;  Location: [facility] ENDOSCOPY;  Service: Cardiovascular;  Laterality: N/A;  . TEE WITHOUT CARDIOVERSION N/A 09/13/2017   Procedure: TRANSESOPHAGEAL ECHOCARDIOGRAM (TEE);  Surgeon: Melrose Nakayama, MD;  Location: [place];  Service: Open Heart Surgery;  Laterality: N/A;  . THROMBECTOMY W/ EMBOLECTOMY Left 03/26/2013   Procedure: Attempted thrombectomy of left arm arteriovenous goretex graft.;  Surgeon: Angelia Mould, MD;  Location: [place];  Service: Vascular;  Laterality: Left;  [name] VENOGRAM N/A 04/07/2013   Procedure: VENOGRAM;  Surgeon: Serafina Mitchell, MD;  Location: [facility] CATH LAB;  Service: Cardiovascular;  Laterality: N/A;    Current Medications: No outpatient medications have been marked as taking for the 02/04/18 encounter (Office Visit)  with Daune Perch, NP.     Allergies:   Patient has no known allergies.   Social History   Socioeconomic History  . Marital status: Married    Spouse name: Not on file  . Number of children: Not on file  . Years of education: Not on file  . Highest education level: Not on file  Occupational History  . Occupation: Cabin crew: DISABLED    Comment: disabled  Social Needs  . Financial resource strain: Not on file  . Food insecurity:    Worry: Not on file    Inability: Not on file  . Transportation needs:    Medical: Not on file    Non-medical: Not on file  Tobacco Use  . Smoking status: Former Smoker    Years: 7.00    Last attempt to quit: 06/10/1973    Years since quitting: 44.6  . Smokeless tobacco: Never Used  . Tobacco comment: Quit in 1974.  Substance and Sexual Activity  . Alcohol use: No    Alcohol/week: 0.0 oz  . Drug use: No  . Sexual activity: Not on file  Lifestyle  . Physical activity:    Days per week: Not on file    Minutes per session: Not on file  . Stress: Not on file  Relationships  . Social connections:    Talks on phone: Not on file    Gets together: Not on file    Attends religious service: Not on file    Active member of club or organization: Not on file    Attends meetings of clubs or organizations: Not on file    Relationship status: Not on file  Other Topics Concern  . Not on file  Social History Narrative   Adopted mentally retarded child at home, 2 adopted children, 3 living and 2 deceased.     Family History: The patient's family history includes Diabetes in his brother, sister, and son; Heart disease in his father. ROS:   Please see the history of present illness.     All other systems reviewed and are negative.  EKGs/Labs/Other Studies Reviewed:    The following studies were reviewed today:  Echo 09/08/17 Study Conclusions - Left ventricle: Inferobasal and posterior lateral hypokinesis EF  similar to  echo from 2016. The cavity size was normal. Wall thickness was normal. Systolic function was mildly reduced. The estimated ejection fraction was in the range of 45% to 50%. - Mitral valve: Calcified annulus. Mildly thickened leaflets . There was mild regurgitation. - Left atrium: The atrium was moderately dilated. - Pulmonary arteries: PA peak pressure: 50 mm Hg (S).   Cardiac cath 09/09/17 Conclusion    Ost LM lesion is 65% stenosed.  Mid LM lesion is 90% stenosed.  Ost Cx to Prox Cx lesion is 100% stenosed.  Ost LAD lesion is 20% stenosed.  Acute Mrg lesion is 60% stenosed.  Significant coronary calcification with high-grade 65% ostial and 90-95% distal left main stenosis, calcification of the proximal LAD with 20-25% narrowing, large ramus intermediate vessel; total occlusion of the left circumflex at the ostium. Dominant RCA with 60% ostial proximal narrowing in an RV marginal branch and collateralization to the distal circumflex.  LVEDP 14 mm Hg.  RECOMMENDATION: Surgical consultation for CABG revascularization.      EKG:  EKG is  ordered today.  The ekg ordered today demonstrates atrial flutter with variable AV conduction. 82 bpm. No significant changes  Recent Labs: 09/08/2017: B Natriuretic Peptide 4,125.9 09/20/2017: Magnesium 2.0 11/08/2017: ALT 14 12/18/2017: BUN 17; Creatinine, Ser 5.21; Hemoglobin 11.3; Platelets 227; Potassium 4.6; Sodium 139   Recent Lipid Panel    Component Value Date/Time   CHOL 116 10/28/2016 0442   TRIG 92 10/28/2016 0442   HDL 48 10/28/2016 0442   CHOLHDL 2.4 10/28/2016 0442   VLDL 18 10/28/2016 0442   LDLCALC 50 10/28/2016 0442    Physical Exam:    VS:  BP 140/62   Pulse 82   Ht 5\' 9"  (1.753 m)   SpO2 97%   BMI 32.05 kg/m     Wt Readings from Last 3 Encounters:  01/07/18 217 lb (98.4 kg)  12/18/17 217 lb (98.4 kg)  12/09/17 205 lb 3.2 oz (93.1 kg)     Physical Exam  Constitutional: He is oriented to  person, place, and time.  Obese male in wheelchair  HENT:  Head: Normocephalic and atraumatic.  Neck: Normal range of motion. Neck supple. No JVD present.  Cardiovascular: Normal rate and normal heart sounds. Exam reveals no gallop and no friction rub.  No murmur heard. Mildly irregular rhythm  Pulmonary/Chest: Effort normal and breath sounds normal. No respiratory distress. He has no wheezes. He has no rales.  Abdominal: Soft.  Musculoskeletal: Normal range of motion. He exhibits edema.  Trace lower extremity edema.   Neurological: He is alert and oriented to person, place, and time.  Pt is difficult to understand and is not able to give much detailed information. He answers simple questions appropriately.   Skin: Skin is warm and dry.  Psychiatric: He has a normal mood and affect. His behavior is normal.    ASSESSMENT:    1. Paroxysmal atrial flutter ([place])   2. Coronary artery disease involving native coronary artery of native heart without angina pectoris   3. Chronic combined systolic and diastolic CHF (congestive heart failure) ([place])   4. ESRD (end stage renal disease) ([place])   5. History of CVA with residual deficit   6. Hypertension due to end stage renal disease caused by type 2 diabetes mellitus, on dialysis [facility])    PLAN:    In order of problems listed above:  Atrial flutter now persistent: Patient left SNF [facility] [name] and was off all of his  meds for about 2 1/2 weeks. Amiodarone was restarted at 400 mg bid and is now on 200 mg BID. He is still in aflutter with good rate control. Asymptomatic.  Will decrease to 200 mg daily.  We have been trying to prepare pt for cardioversion, but can't get therapeutic INRs documented. He was off of his warfarin along with his other meds. He has now restarted warfarin about 2 weeks ago and according to his wife, [facility] home [facility] is monitoring his INR. (Not sure who is adjusting his dose, I asked his wife to call Ernest [name] with that  information). I have asked her to have the INR checked weekly and communicate the levels to Ernest [name] so we can try to cardiovert once adequately anticoagulated for 3-4 weeks.  INR checked today in office-- 1.4 so apparently he is taking warfarin but is not therapeutic.   CAD: S/P CABG with MV repair 09/13/17. No anginal symptoms.   Chronic combined systolic and diastolic heart failure: Appears volume stable. Fluid management per dialysis.   Mitral valve disease S/P MV repair 09/13/17: No murmur  ESRD on HD MWF: Pt reports doing well with dialysis.   History of CVA: On aspirin and statin.  No hemodynamically significant carotid artery stenosis.  Patient with residual dysphasia and left-sided weakness. No new deficits.   Hypertension: Hydralazine 25 mg twice daily, Lopressor 25 mg twice daily. BP stable.   Hyperlipidemia: Lipitor 40 mg daily.  Diabetes type 2 on insulin: Poorly controlled.  Hemoglobin A1c 9.1 in 08/2017.   Medication Adjustments/Labs and Tests Ordered: Current medicines are reviewed at length with the patient today.  Concerns regarding medicines are outlined above. Labs and tests ordered and medication changes are outlined in the patient instructions below:  Patient Instructions  Medication Instructions: Your physician recommends that you continue on your current medications as directed. Please refer to the Current Medication list given to you today.   Labwork: TODAY: INR/PT  Procedures/Testing: NONE  Follow-Up: Your physician recommends that you schedule a follow-up appointment in: 1 month with Pecolia Ades   Any Additional Special Instructions Will Be Listed Below (If Applicable).  Please call our office to review medications we would like to do a cardioversion due to your abnormal heart rate, we need your blood thinner to be therapeutic, if you are not on coumadin you need to be seen by the the coumadin department. Your INR needs to be checked weekly. Call Ernest [name] at  [phone]   If you need a refill on your cardiac medications before your next appointment, please call your pharmacy.      Signed, Daune Perch, NP  02/04/2018 5:52 PM    [place]

## 2018-02-04 NOTE — Patient Instructions (Signed)
Medication Instructions: Your physician recommends that you continue on your current medications as directed. Please refer to the Current Medication list given to you today.   Labwork: TODAY: INR/PT  Procedures/Testing: NONE  Follow-Up: Your physician recommends that you schedule a follow-up appointment in: 1 month with Pecolia Ades   Any Additional Special Instructions Will Be Listed Below (If Applicable).  Please call our office to review medications we would like to do a cardioversion due to your abnormal heart rate, we need your blood thinner to be therapeutic, if you are not on coumadin you need to be seen by the the coumadin department. Your INR needs to be checked weekly. Call us at [phone]   If you need a refill on your cardiac medications before your next appointment, please call your pharmacy.

## 2018-02-05 DIAGNOSIS — D509 Iron deficiency anemia, unspecified: Secondary | ICD-10-CM | POA: Diagnosis not present

## 2018-02-05 DIAGNOSIS — E119 Type 2 diabetes mellitus without complications: Secondary | ICD-10-CM | POA: Diagnosis not present

## 2018-02-05 DIAGNOSIS — N2581 Secondary hyperparathyroidism of renal origin: Secondary | ICD-10-CM | POA: Diagnosis not present

## 2018-02-05 DIAGNOSIS — E1129 Type 2 diabetes mellitus with other diabetic kidney complication: Secondary | ICD-10-CM | POA: Diagnosis not present

## 2018-02-05 DIAGNOSIS — D631 Anemia in chronic kidney disease: Secondary | ICD-10-CM | POA: Diagnosis not present

## 2018-02-05 DIAGNOSIS — N186 End stage renal disease: Secondary | ICD-10-CM | POA: Diagnosis not present

## 2018-02-06 ENCOUNTER — Telehealth: Payer: Self-pay | Admitting: Cardiology

## 2018-02-06 ENCOUNTER — Encounter: Payer: Self-pay | Admitting: Neurology

## 2018-02-06 DIAGNOSIS — I5043 Acute on chronic combined systolic (congestive) and diastolic (congestive) heart failure: Secondary | ICD-10-CM | POA: Diagnosis not present

## 2018-02-06 DIAGNOSIS — I132 Hypertensive heart and chronic kidney disease with heart failure and with stage 5 chronic kidney disease, or end stage renal disease: Secondary | ICD-10-CM | POA: Diagnosis not present

## 2018-02-06 NOTE — Telephone Encounter (Signed)
Called the patient's wife to follow up on who is monitoring the patient's coumadin. She says the [place] was at the house today and was going to check into who is managing it. His wife says she should know tomorrow. I told her that if no one is managing it we will need to set him up with our coumadin clinic. I told her that I will call back tomorrow and hopefully she will have more information.

## 2018-02-07 ENCOUNTER — Telehealth: Payer: Self-pay | Admitting: Cardiology

## 2018-02-07 DIAGNOSIS — N186 End stage renal disease: Secondary | ICD-10-CM | POA: Diagnosis not present

## 2018-02-07 DIAGNOSIS — N2581 Secondary hyperparathyroidism of renal origin: Secondary | ICD-10-CM | POA: Diagnosis not present

## 2018-02-07 DIAGNOSIS — E119 Type 2 diabetes mellitus without complications: Secondary | ICD-10-CM | POA: Diagnosis not present

## 2018-02-07 DIAGNOSIS — D509 Iron deficiency anemia, unspecified: Secondary | ICD-10-CM | POA: Diagnosis not present

## 2018-02-07 DIAGNOSIS — D631 Anemia in chronic kidney disease: Secondary | ICD-10-CM | POA: Diagnosis not present

## 2018-02-07 NOTE — Telephone Encounter (Signed)
I called Ernest [name] to check on what she found out about Ernest [name] coumadin monitoring. She gave me the number to the [facility] office that is following him. I called and apparently Dr. Daphene Jaeger is following the coumadin and INR's.

## 2018-02-10 DIAGNOSIS — N2581 Secondary hyperparathyroidism of renal origin: Secondary | ICD-10-CM | POA: Diagnosis not present

## 2018-02-10 DIAGNOSIS — D631 Anemia in chronic kidney disease: Secondary | ICD-10-CM | POA: Diagnosis not present

## 2018-02-10 DIAGNOSIS — E119 Type 2 diabetes mellitus without complications: Secondary | ICD-10-CM | POA: Diagnosis not present

## 2018-02-10 DIAGNOSIS — N186 End stage renal disease: Secondary | ICD-10-CM | POA: Diagnosis not present

## 2018-02-10 DIAGNOSIS — D509 Iron deficiency anemia, unspecified: Secondary | ICD-10-CM | POA: Diagnosis not present

## 2018-02-11 DIAGNOSIS — E1122 Type 2 diabetes mellitus with diabetic chronic kidney disease: Secondary | ICD-10-CM | POA: Diagnosis not present

## 2018-02-11 DIAGNOSIS — I132 Hypertensive heart and chronic kidney disease with heart failure and with stage 5 chronic kidney disease, or end stage renal disease: Secondary | ICD-10-CM | POA: Diagnosis not present

## 2018-02-11 DIAGNOSIS — I5043 Acute on chronic combined systolic (congestive) and diastolic (congestive) heart failure: Secondary | ICD-10-CM | POA: Diagnosis not present

## 2018-02-12 DIAGNOSIS — E1129 Type 2 diabetes mellitus with other diabetic kidney complication: Secondary | ICD-10-CM | POA: Diagnosis not present

## 2018-02-12 DIAGNOSIS — N2581 Secondary hyperparathyroidism of renal origin: Secondary | ICD-10-CM | POA: Diagnosis not present

## 2018-02-12 DIAGNOSIS — E119 Type 2 diabetes mellitus without complications: Secondary | ICD-10-CM | POA: Diagnosis not present

## 2018-02-12 DIAGNOSIS — Z992 Dependence on renal dialysis: Secondary | ICD-10-CM | POA: Diagnosis not present

## 2018-02-12 DIAGNOSIS — D631 Anemia in chronic kidney disease: Secondary | ICD-10-CM | POA: Diagnosis not present

## 2018-02-12 DIAGNOSIS — R4182 Altered mental status, unspecified: Secondary | ICD-10-CM | POA: Diagnosis not present

## 2018-02-12 DIAGNOSIS — N186 End stage renal disease: Secondary | ICD-10-CM | POA: Diagnosis not present

## 2018-02-12 DIAGNOSIS — D509 Iron deficiency anemia, unspecified: Secondary | ICD-10-CM | POA: Diagnosis not present

## 2018-02-14 DIAGNOSIS — D631 Anemia in chronic kidney disease: Secondary | ICD-10-CM | POA: Diagnosis not present

## 2018-02-14 DIAGNOSIS — N2581 Secondary hyperparathyroidism of renal origin: Secondary | ICD-10-CM | POA: Diagnosis not present

## 2018-02-14 DIAGNOSIS — E119 Type 2 diabetes mellitus without complications: Secondary | ICD-10-CM | POA: Diagnosis not present

## 2018-02-14 DIAGNOSIS — N186 End stage renal disease: Secondary | ICD-10-CM | POA: Diagnosis not present

## 2018-02-14 DIAGNOSIS — D509 Iron deficiency anemia, unspecified: Secondary | ICD-10-CM | POA: Diagnosis not present

## 2018-02-14 DIAGNOSIS — R4182 Altered mental status, unspecified: Secondary | ICD-10-CM | POA: Diagnosis not present

## 2018-02-17 DIAGNOSIS — R4182 Altered mental status, unspecified: Secondary | ICD-10-CM | POA: Diagnosis not present

## 2018-02-17 DIAGNOSIS — D509 Iron deficiency anemia, unspecified: Secondary | ICD-10-CM | POA: Diagnosis not present

## 2018-02-17 DIAGNOSIS — N186 End stage renal disease: Secondary | ICD-10-CM | POA: Diagnosis not present

## 2018-02-17 DIAGNOSIS — E119 Type 2 diabetes mellitus without complications: Secondary | ICD-10-CM | POA: Diagnosis not present

## 2018-02-17 DIAGNOSIS — D631 Anemia in chronic kidney disease: Secondary | ICD-10-CM | POA: Diagnosis not present

## 2018-02-17 DIAGNOSIS — N2581 Secondary hyperparathyroidism of renal origin: Secondary | ICD-10-CM | POA: Diagnosis not present

## 2018-02-18 DIAGNOSIS — I132 Hypertensive heart and chronic kidney disease with heart failure and with stage 5 chronic kidney disease, or end stage renal disease: Secondary | ICD-10-CM | POA: Diagnosis not present

## 2018-02-18 DIAGNOSIS — I5043 Acute on chronic combined systolic (congestive) and diastolic (congestive) heart failure: Secondary | ICD-10-CM | POA: Diagnosis not present

## 2018-02-19 DIAGNOSIS — D509 Iron deficiency anemia, unspecified: Secondary | ICD-10-CM | POA: Diagnosis not present

## 2018-02-19 DIAGNOSIS — N2581 Secondary hyperparathyroidism of renal origin: Secondary | ICD-10-CM | POA: Diagnosis not present

## 2018-02-19 DIAGNOSIS — N186 End stage renal disease: Secondary | ICD-10-CM | POA: Diagnosis not present

## 2018-02-19 DIAGNOSIS — E119 Type 2 diabetes mellitus without complications: Secondary | ICD-10-CM | POA: Diagnosis not present

## 2018-02-19 DIAGNOSIS — D631 Anemia in chronic kidney disease: Secondary | ICD-10-CM | POA: Diagnosis not present

## 2018-02-19 DIAGNOSIS — R4182 Altered mental status, unspecified: Secondary | ICD-10-CM | POA: Diagnosis not present

## 2018-02-21 DIAGNOSIS — N2581 Secondary hyperparathyroidism of renal origin: Secondary | ICD-10-CM | POA: Diagnosis not present

## 2018-02-21 DIAGNOSIS — D509 Iron deficiency anemia, unspecified: Secondary | ICD-10-CM | POA: Diagnosis not present

## 2018-02-21 DIAGNOSIS — R4182 Altered mental status, unspecified: Secondary | ICD-10-CM | POA: Diagnosis not present

## 2018-02-21 DIAGNOSIS — D631 Anemia in chronic kidney disease: Secondary | ICD-10-CM | POA: Diagnosis not present

## 2018-02-21 DIAGNOSIS — E119 Type 2 diabetes mellitus without complications: Secondary | ICD-10-CM | POA: Diagnosis not present

## 2018-02-21 DIAGNOSIS — N186 End stage renal disease: Secondary | ICD-10-CM | POA: Diagnosis not present

## 2018-02-24 DIAGNOSIS — D509 Iron deficiency anemia, unspecified: Secondary | ICD-10-CM | POA: Diagnosis not present

## 2018-02-24 DIAGNOSIS — E119 Type 2 diabetes mellitus without complications: Secondary | ICD-10-CM | POA: Diagnosis not present

## 2018-02-24 DIAGNOSIS — D631 Anemia in chronic kidney disease: Secondary | ICD-10-CM | POA: Diagnosis not present

## 2018-02-24 DIAGNOSIS — N186 End stage renal disease: Secondary | ICD-10-CM | POA: Diagnosis not present

## 2018-02-24 DIAGNOSIS — N2581 Secondary hyperparathyroidism of renal origin: Secondary | ICD-10-CM | POA: Diagnosis not present

## 2018-02-24 DIAGNOSIS — R4182 Altered mental status, unspecified: Secondary | ICD-10-CM | POA: Diagnosis not present

## 2018-02-26 DIAGNOSIS — E119 Type 2 diabetes mellitus without complications: Secondary | ICD-10-CM | POA: Diagnosis not present

## 2018-02-26 DIAGNOSIS — N2581 Secondary hyperparathyroidism of renal origin: Secondary | ICD-10-CM | POA: Diagnosis not present

## 2018-02-26 DIAGNOSIS — D631 Anemia in chronic kidney disease: Secondary | ICD-10-CM | POA: Diagnosis not present

## 2018-02-26 DIAGNOSIS — N186 End stage renal disease: Secondary | ICD-10-CM | POA: Diagnosis not present

## 2018-02-26 DIAGNOSIS — D509 Iron deficiency anemia, unspecified: Secondary | ICD-10-CM | POA: Diagnosis not present

## 2018-02-26 DIAGNOSIS — R4182 Altered mental status, unspecified: Secondary | ICD-10-CM | POA: Diagnosis not present

## 2018-02-28 DIAGNOSIS — R4182 Altered mental status, unspecified: Secondary | ICD-10-CM | POA: Diagnosis not present

## 2018-02-28 DIAGNOSIS — E119 Type 2 diabetes mellitus without complications: Secondary | ICD-10-CM | POA: Diagnosis not present

## 2018-02-28 DIAGNOSIS — N186 End stage renal disease: Secondary | ICD-10-CM | POA: Diagnosis not present

## 2018-02-28 DIAGNOSIS — N2581 Secondary hyperparathyroidism of renal origin: Secondary | ICD-10-CM | POA: Diagnosis not present

## 2018-02-28 DIAGNOSIS — D509 Iron deficiency anemia, unspecified: Secondary | ICD-10-CM | POA: Diagnosis not present

## 2018-02-28 DIAGNOSIS — D631 Anemia in chronic kidney disease: Secondary | ICD-10-CM | POA: Diagnosis not present

## 2018-03-03 DIAGNOSIS — N186 End stage renal disease: Secondary | ICD-10-CM | POA: Diagnosis not present

## 2018-03-03 DIAGNOSIS — D509 Iron deficiency anemia, unspecified: Secondary | ICD-10-CM | POA: Diagnosis not present

## 2018-03-03 DIAGNOSIS — D631 Anemia in chronic kidney disease: Secondary | ICD-10-CM | POA: Diagnosis not present

## 2018-03-03 DIAGNOSIS — E119 Type 2 diabetes mellitus without complications: Secondary | ICD-10-CM | POA: Diagnosis not present

## 2018-03-03 DIAGNOSIS — N2581 Secondary hyperparathyroidism of renal origin: Secondary | ICD-10-CM | POA: Diagnosis not present

## 2018-03-03 DIAGNOSIS — R4182 Altered mental status, unspecified: Secondary | ICD-10-CM | POA: Diagnosis not present

## 2018-03-05 DIAGNOSIS — R4182 Altered mental status, unspecified: Secondary | ICD-10-CM | POA: Diagnosis not present

## 2018-03-05 DIAGNOSIS — N2581 Secondary hyperparathyroidism of renal origin: Secondary | ICD-10-CM | POA: Diagnosis not present

## 2018-03-05 DIAGNOSIS — D509 Iron deficiency anemia, unspecified: Secondary | ICD-10-CM | POA: Diagnosis not present

## 2018-03-05 DIAGNOSIS — E119 Type 2 diabetes mellitus without complications: Secondary | ICD-10-CM | POA: Diagnosis not present

## 2018-03-05 DIAGNOSIS — N186 End stage renal disease: Secondary | ICD-10-CM | POA: Diagnosis not present

## 2018-03-05 DIAGNOSIS — D631 Anemia in chronic kidney disease: Secondary | ICD-10-CM | POA: Diagnosis not present

## 2018-03-07 DIAGNOSIS — E119 Type 2 diabetes mellitus without complications: Secondary | ICD-10-CM | POA: Diagnosis not present

## 2018-03-07 DIAGNOSIS — N186 End stage renal disease: Secondary | ICD-10-CM | POA: Diagnosis not present

## 2018-03-07 DIAGNOSIS — R4182 Altered mental status, unspecified: Secondary | ICD-10-CM | POA: Diagnosis not present

## 2018-03-07 DIAGNOSIS — N2581 Secondary hyperparathyroidism of renal origin: Secondary | ICD-10-CM | POA: Diagnosis not present

## 2018-03-07 DIAGNOSIS — D631 Anemia in chronic kidney disease: Secondary | ICD-10-CM | POA: Diagnosis not present

## 2018-03-07 DIAGNOSIS — D509 Iron deficiency anemia, unspecified: Secondary | ICD-10-CM | POA: Diagnosis not present

## 2018-03-10 ENCOUNTER — Encounter: Payer: Self-pay | Admitting: Cardiology

## 2018-03-10 DIAGNOSIS — E119 Type 2 diabetes mellitus without complications: Secondary | ICD-10-CM | POA: Diagnosis not present

## 2018-03-10 DIAGNOSIS — N186 End stage renal disease: Secondary | ICD-10-CM | POA: Diagnosis not present

## 2018-03-10 DIAGNOSIS — N2581 Secondary hyperparathyroidism of renal origin: Secondary | ICD-10-CM | POA: Diagnosis not present

## 2018-03-10 DIAGNOSIS — D631 Anemia in chronic kidney disease: Secondary | ICD-10-CM | POA: Diagnosis not present

## 2018-03-10 DIAGNOSIS — D509 Iron deficiency anemia, unspecified: Secondary | ICD-10-CM | POA: Diagnosis not present

## 2018-03-10 DIAGNOSIS — R4182 Altered mental status, unspecified: Secondary | ICD-10-CM | POA: Diagnosis not present

## 2018-03-10 NOTE — Progress Notes (Signed)
Cardiology Office Note:    Date:  03/11/2018   ID:  Ernest [name], DOB 11-Mar-1946, MRN [id number]  PCP:  Patient, [name]  Cardiologist:  Larae Grooms, MD  Referring MD: No ref. provider found   Chief Complaint  Patient presents with  . Follow-up    atrial flutter, CHF    History of Present Illness:    Ernest [name] is a 72 y.o. male with a past medical history significant for ESRD on hemodialysis M/W/S, persistent atrial flutter, CAD s/p CABG 4 and mitral valve repair 09/13/17, NSVT, chronic combined systolic and diastolic heart failure, CVA, OSA, HLD, type 2 diabetes, hypertension and COPD.   The patient is being treated for persistent atrial flutter on amiodarone and Coumadin. We had tried to work towards an [place] cardioversion for his atrial flutter, however, the patient is not consistent with his Coumadin therapy and monitoring. She had been placed in a nursing facility and we thought that regular administration of his medications and follow up labs would be key however the patient left the facility  [facility]. At his last follow-up on 02/04/18, I discovered that he was taking Coumadin but was not being monitored. At home he was being followed by [facility] and [facility]. I called and spoke to Dr. Daphene Jaeger (Dr's making house calls) who stated that he would monitor the Coumadin and INRs. We discussed the fact that the patient was not reliable enough on his anticoagulation to be able to safely conduct a cardioversion.  At his appointment on 02/04/18, the patient had edema but was looking better than he had in the previous month. Today he is here alone in his wheelchair. I had asked his wife to come with him by phone with his prior appointment as the patient is a poor historian. She is not here today. [name] actually looks better today. He is more conversant and understandable as well as looking stronger. He denies chest discomfort,  shortness of breath, orthopnea, PND, lightheadedness or syncope. He denies palpitations or feeling fast heart beat but he says that sometimes they note a fast heart beat at dialysis. He has no edema today. He is going to dialysis regularly. He is in a wheelchair but does walk some in his home with a walker. He reports no longer taking warfarin. He reports that his wife was "afraid" of it because of the need for monitoring. He states that he falls fairly frequently with at least 2 falls in the last month. He denies injury.   The patient's wife was called to review the med list. She says that the patient is still taking the warfarin and his blood has not been checked in 2 weeks. She also has been continuing to give him amiodarone 200 mg BID instead of decreasing it to 200 mg daily as ordered at last appt.   This case has been difficult due to social issues. It is hard to follow who is caring for the patient. He says that [name] is no longer coming to the home and he has not seen Dr. Daphene Jaeger in at least a month. His wife is very difficult to understand on the phone and she does not come to his appointments as asked, nor does she send a list of his current meds as asked. She did not make the recommended change in amiodarone dosing from prior appointment.    Past Medical History:  Diagnosis Date  . Abnormal stress test  s/p cath November 2013 with modest disease involving the ostial left main, proximal LAD, proximal RCA - do not appear to be hemodynamically signficant; mild LV dysfunction  . Bacteremia   . Chronic systolic CHF (congestive heart failure) ([place])   . COPD (chronic obstructive pulmonary disease) ([place])   . Coronary artery disease    a. per cath report 2013, b. 09/13/2017-CABG X4 & Mitral valve repair  . Ejection fraction < 50%   . Erectile dysfunction    penile implant  . ESRD (end stage renal disease) ([place])    Started HD in [place] in 2009, ESRD was due to DM. Moved to [facility] in Dec 2009  and now gets dialysis at [facility] on a MWF schedule.     . Gout    Hx: of  . Hepatitis C   . Hyperlipidemia   . Hypertension   . Mitral valve disease    s/p Mitral repair 09/13/17  . Obesity   . Retinopathy   . Shortness of breath    Hx: of with exertion  . Stroke ([place])   . Type II or unspecified type diabetes mellitus without mention of complication, not stated as uncontrolled    adult onset    Past Surgical History:  Procedure Laterality Date  . A/V FISTULAGRAM N/A 10/04/2017   Procedure: A/V FISTULAGRAM;  Surgeon: Elam Dutch, MD;  Location: [place] [facility];  Service: Cardiovascular;  Laterality: N/A;  . AV FISTULA PLACEMENT Left 01/13/2013   Procedure: INSERTION OF ARTERIOVENOUS (AV) GORE-TEX GRAFT ARM;  Surgeon: Angelia Mould, MD;  Location: [place];  Service: Vascular;  Laterality: Left;  . AV FISTULA PLACEMENT Left 05/05/2013   Procedure: INSERTION OF LEFT UPPER ARM  ARTERIOVENOUS GORTEX GRAFT;  Surgeon: Angelia Mould, MD;  Location: [place];  Service: Vascular;  Laterality: Left;  . [place] REMOVAL Left 03/26/2013   Procedure: REMOVAL OF  NON INCORPORATED ARTERIOVENOUS GORETEX GRAFT ([place]) left arm * repair of  left brachial artery with vein patch angioplasty.;  Surgeon: Angelia Mould, MD;  Location: [place];  Service: Vascular;  Laterality: Left;  . CARDIAC CATHETERIZATION  August 26, 2012  . CATARACT EXTRACTION W/ INTRAOCULAR LENS  IMPLANT, BILATERAL    . COLONOSCOPY    . CORONARY ARTERY BYPASS GRAFT N/A 09/13/2017   Procedure: CORONARY ARTERY BYPASS GRAFTING (CABG) times four LIMA  to LAD, SVG sequentially to OM and RAMUS Intermediate and SVG to Acute Marginal;  Surgeon: Melrose Nakayama, MD;  Location: [place];  Service: Open Heart Surgery;  Laterality: N/A;  . EMBOLECTOMY Right 08/27/2013   Procedure: EMBOLECTOMY;  Surgeon: Mal Misty, MD;  Location: [place];  Service: Vascular;  Laterality: Right;  Thrombectomy of Radial and ulnar artery.  [name]  EYE SURGERY     laser.  and surgery for DM  . fistula     RUE and wrist  . INSERTION OF DIALYSIS CATHETER Right 01/01/2013   Procedure: INSERTION OF DIALYSIS CATHETER right  internal jugular;  Surgeon: Serafina Mitchell, MD;  Location: [place];  Service: Vascular;  Laterality: Right;  . INSERTION OF DIALYSIS CATHETER N/A 03/26/2013   Procedure: INSERTION OF DIALYSIS CATHETER Left internal jugular vein;  Surgeon: Angelia Mould, MD;  Location: [place];  Service: Vascular;  Laterality: N/A;  . LEFT HEART CATH AND CORONARY ANGIOGRAPHY N/A 09/09/2017   Procedure: LEFT HEART CATH AND CORONARY ANGIOGRAPHY;  Surgeon: Troy Sine, MD;  Location: [place] [facility];  Service: Cardiovascular;  Laterality: N/A;  . LIGATION OF ARTERIOVENOUS  FISTULA Right 12/30/2013   Procedure: REMOVAL OF SEGMENT OF GORTEX GRAFT AND FISTULA  AND REPAIR OF BRACHIAL ARTERY;  Surgeon: Mal Misty, MD;  Location: [place];  Service: Vascular;  Laterality: Right;  . MITRAL VALVE REPAIR N/A 09/13/2017   Procedure: MITRAL VALVE REPAIR (MVR);  Surgeon: Melrose Nakayama, MD;  Location: [place];  Service: Open Heart Surgery;  Laterality: N/A;  . MITRAL VALVE REPAIR (MV)/CORONARY ARTERY BYPASS GRAFTING (CABG)  09/13/2017  . PENILE PROSTHESIS IMPLANT     1997.. no card  . PERIPHERAL VASCULAR BALLOON ANGIOPLASTY Left 10/04/2017   Procedure: PERIPHERAL VASCULAR BALLOON ANGIOPLASTY;  Surgeon: Elam Dutch, MD;  Location: [place] [facility];  Service: Cardiovascular;  Laterality: Left;  AVF  . TEE WITHOUT CARDIOVERSION N/A 06/11/2013   Procedure: TRANSESOPHAGEAL ECHOCARDIOGRAM (TEE);  Surgeon: Larey Dresser, MD;  Location: [facility]COPY;  Service: Cardiovascular;  Laterality: N/A;  . TEE WITHOUT CARDIOVERSION N/A 09/13/2017   Procedure: TRANSESOPHAGEAL ECHOCARDIOGRAM (TEE);  Surgeon: Melrose Nakayama, MD;  Location: [place];  Service: Open Heart Surgery;  Laterality: N/A;  . THROMBECTOMY W/ EMBOLECTOMY Left 03/26/2013    Procedure: Attempted thrombectomy of left arm arteriovenous goretex graft.;  Surgeon: Angelia Mould, MD;  Location: [place];  Service: Vascular;  Laterality: Left;  [name] VENOGRAM N/A 04/07/2013   Procedure: VENOGRAM;  Surgeon: Serafina Mitchell, MD;  Location: [facility] CATH LAB;  Service: Cardiovascular;  Laterality: N/A;    Current Medications: Current Meds  Medication Sig  . amiodarone (PACERONE) 200 MG tablet Take 1 tablet (200 mg total) by mouth daily.  [name] aspirin EC 81 MG tablet Take 1 tablet (81 mg total) by mouth daily.  [name] atorvastatin (LIPITOR) 40 MG tablet Take 40 mg by mouth daily.   . B Complex-C-Folic Acid (NEPHRO-VITE PO) Take 1 tablet by mouth daily.  . calcium acetate (PHOSLO) 667 MG capsule Take 667 mg by mouth 3 (three) times daily with meals.   . cinacalcet (SENSIPAR) 30 MG tablet Take 4 tablets (120 mg total) by mouth daily with supper.  . Darbepoetin Alfa (ARANESP) 100 MCG/0.5ML SOSY injection Inject 100 mcg into the skin every 7 (seven) days. Administered with dialysis  . doxercalciferol (HECTOROL) 4 MCG/2ML injection Inject 2 mcg into the vein every Monday, Wednesday, and Friday with hemodialysis. Administered at dialysis  . ferric citrate (AURYXIA) 1 GM 210 MG(Fe) tablet Take 420 mg by mouth 3 (three) times daily with meals.   . ferric gluconate 125 mg in sodium chloride 0.9 % 100 mL Inject 250 mg into the vein every Monday, Wednesday, and Friday with hemodialysis.   [name] insulin detemir (LEVEMIR) 100 UNIT/ML injection Inject 0.2 mLs (20 Units total) into the skin 2 (two) times daily.  [name] levETIRAcetam (KEPPRA) 500 MG tablet Take 2 tablets (1,000 mg total) by mouth daily. Then take extra 500 mg (1 tablet) on MWF after HD  . linaclotide (LINZESS) 145 MCG CAPS capsule Take 145 mcg by mouth daily.  . metoprolol tartrate (LOPRESSOR) 50 MG tablet Take 50 mg by mouth daily. Take one-half tablet qd  . Nutritional Supplements (FEEDING SUPPLEMENT, NEPRO CARB STEADY,) LIQD Take 237 mLs by  mouth 2 (two) times daily between meals.  . sennosides-docusate sodium (SENOKOT-S) 8.6-50 MG tablet Take 2 tablets by mouth 2 (two) times daily.  . sertraline (ZOLOFT) 25 MG tablet Take 25 mg by mouth at bedtime.  [name] warfarin (COUMADIN) 4 MG tablet  Take 4 mg by mouth daily.     Allergies:   Patient has no known allergies.   Social History   Socioeconomic History  . Marital status: Married    Spouse name: Not on file  . Number of children: Not on file  . Years of education: Not on file  . Highest education level: Not on file  Occupational History  . Occupation: Cabin crew: DISABLED    Comment: disabled  Social Needs  . Financial resource strain: Not on file  . Food insecurity:    Worry: Not on file    Inability: Not on file  . Transportation needs:    Medical: Not on file    Non-medical: Not on file  Tobacco Use  . Smoking status: Former Smoker    Years: 7.00    Last attempt to quit: 06/10/1973    Years since quitting: 44.7  . Smokeless tobacco: Never Used  . Tobacco comment: Quit in 1974.  Substance and Sexual Activity  . Alcohol use: No    Alcohol/week: 0.0 oz  . Drug use: No  . Sexual activity: Not on file  Lifestyle  . Physical activity:    Days per week: Not on file    Minutes per session: Not on file  . Stress: Not on file  Relationships  . Social connections:    Talks on phone: Not on file    Gets together: Not on file    Attends religious service: Not on file    Active member of club or organization: Not on file    Attends meetings of clubs or organizations: Not on file    Relationship status: Not on file  Other Topics Concern  . Not on file  Social History Narrative   Adopted mentally retarded child at home, 2 adopted children, 3 living and 2 deceased.     Family History: The patient's family history includes Diabetes in his brother, sister, and son; Heart disease in his father. ROS:   Please see the history of present illness.      All other systems reviewed and are negative.  EKGs/Labs/Other Studies Reviewed:    The following studies were reviewed today:  Echo 09/08/17 Study Conclusions - Left ventricle: Inferobasal and posterior lateral hypokinesis EF similar to echo from 2016. The cavity size was normal. Wall thickness was normal. Systolic function was mildly reduced. The estimated ejection fraction was in the range of 45% to 50%. - Mitral valve: Calcified annulus. Mildly thickened leaflets . There was mild regurgitation. - Left atrium: The atrium was moderately dilated. - Pulmonary arteries: PA peak pressure: 50 mm Hg (S).   Cardiac cath 09/09/17 Conclusion    Ost LM lesion is 65% stenosed.  Mid LM lesion is 90% stenosed.  Ost Cx to Prox Cx lesion is 100% stenosed.  Ost LAD lesion is 20% stenosed.  Acute Mrg lesion is 60% stenosed.  Significant coronary calcification with high-grade 65% ostial and 90-95% distal left main stenosis, calcification of the proximal LAD with 20-25% narrowing, large ramus intermediate vessel; total occlusion of the left circumflex at the ostium. Dominant RCA with 60% ostial proximal narrowing in an RV marginal branch and collateralization to the distal circumflex.  LVEDP 14 mm Hg.  RECOMMENDATION: Surgical consultation for CABG revascularization.     EKG:  EKG is ordered today.  The ekg ordered today demonstrates atrial flutter with variable AV conduction, Prolonged QT qith QTC 503  Recent Labs: 09/08/2017: B Natriuretic Peptide  4,125.9 09/20/2017: Magnesium 2.0 11/08/2017: ALT 14 12/18/2017: BUN 17; Creatinine, Ser 5.21; Hemoglobin 11.3; Platelets 227; Potassium 4.6; Sodium 139   Recent Lipid Panel    Component Value Date/Time   CHOL 116 10/28/2016 0442   TRIG 92 10/28/2016 0442   HDL 48 10/28/2016 0442   CHOLHDL 2.4 10/28/2016 0442   VLDL 18 10/28/2016 0442   LDLCALC 50 10/28/2016 0442    Physical Exam:    VS:  Ht 5\' 9"  (1.753 m)   Wt  200 lb (90.7 kg)   BMI 29.53 kg/m     Wt Readings from Last 3 Encounters:  03/11/18 200 lb (90.7 kg)  01/07/18 217 lb (98.4 kg)  12/18/17 217 lb (98.4 kg)     Physical Exam  Constitutional: He is oriented to person, place, and time. He appears well-developed and well-nourished. No distress.  Neck: Normal range of motion. Neck supple. No JVD present.  Cardiovascular: Normal rate, normal heart sounds and intact distal pulses. An irregularly irregular rhythm present. Exam reveals no friction rub.  No murmur heard. Pulmonary/Chest: Effort normal and breath sounds normal. No respiratory distress. He has no wheezes. He has no rales.  Abdominal: Soft. Bowel sounds are normal.  Musculoskeletal: He exhibits no edema.  Left sided weakness.  Neurological: He is alert and oriented to person, place, and time.  Speech is difficult to understand d/t prior strokes.   Skin: Skin is warm and dry.  Psychiatric: He has a normal mood and affect. His behavior is normal. Thought content normal.  Vitals reviewed.    ASSESSMENT:    1. Paroxysmal atrial flutter ([place])   2. Coronary artery disease involving native coronary artery of native heart without angina pectoris   3. Chronic combined systolic and diastolic CHF (congestive heart failure) ([place])   4. Hypertension due to end stage renal disease caused by type 2 diabetes mellitus, on dialysis ([place])   5. Hyperlipidemia associated with type 2 diabetes mellitus ([place])   6. Uncontrolled type 2 diabetes mellitus with end-stage renal disease ([place])    PLAN:    In order of problems listed above:  Persistent atrial flutter: on amiodarone. Also on warfarin for stroke risk reduction. The patient has proven to be unreliable with his anticoagulation to allow for safe electrical cardioversion. He continues to he in atrial flutter but rate is well controlled. He is feeling well and his heart failure symptoms are much improved with well controlled rate. I discussed the  case with Dr. Acie Fredrickson (DOD). We will stop the amiodarone since he is not converting to SR. We will also stop the warfarin since we have had no success in adequately monitoring it. Also the patient is unable to manage his meds and his wife is not reliable. I will have him follow up with Dr. Irish Lack at his next appointment, hopefully in about a month.   CAD:S/P CABG with MV repair 09/13/17. No anginal symptoms. continue aspirin 81 mg, statin, beta blocker  Chronic combined systolic and diastolic heart failure: Appears volume stable. Wt is down 17 lbs in last 2 months. No edema today. Pt denies dyspnea or orthopnea.  Fluid management per dialysis.   Mitral valve disease S/P MV repair 09/13/17: No murmur  ESRD on HD MWF: Pt reports doing well with dialysis.   History of CVA: On aspirin and statin.  No hemodynamically significant carotid artery stenosis.  Patient with residual dysphasia and left-sided weakness. No new deficits.   Hypertension: Hydralazine 25 mg twice daily, Lopressor 25  mg twice daily. BP stable. Monitor BP on increased lopressor for heart rate control.  Hyperlipidemia: Lipitor 40 mg daily.  Diabetes type 2 on insulin: Poorly controlled.  Hemoglobin A1c 9.1 in 08/2017. Management per PCP (although the patient denies having a PCP, he says that he is no longer being seen by [facility] making house calls- Dr. Daphene Jaeger, He says that his dialysis doctor manages it). I advised him to get established with a PCP.   Medication Adjustments/Labs and Tests Ordered: Current medicines are reviewed at length with the patient today.  Concerns regarding medicines are outlined above. Labs and tests ordered and medication changes are outlined in the patient instructions below:  There are no Patient Instructions on file for this visit.   Signed, Daune Perch, NP  03/11/2018 10:30 AM    [facility]

## 2018-03-11 ENCOUNTER — Encounter: Payer: Self-pay | Admitting: Cardiology

## 2018-03-11 ENCOUNTER — Ambulatory Visit: Payer: Medicare Other | Admitting: Podiatry

## 2018-03-11 ENCOUNTER — Ambulatory Visit (INDEPENDENT_AMBULATORY_CARE_PROVIDER_SITE_OTHER): Payer: Medicare Other | Admitting: Cardiology

## 2018-03-11 VITALS — BP 120/70 | HR 66 | Ht 69.0 in | Wt 200.0 lb

## 2018-03-11 DIAGNOSIS — IMO0002 Reserved for concepts with insufficient information to code with codable children: Secondary | ICD-10-CM

## 2018-03-11 DIAGNOSIS — E1169 Type 2 diabetes mellitus with other specified complication: Secondary | ICD-10-CM | POA: Diagnosis not present

## 2018-03-11 DIAGNOSIS — N186 End stage renal disease: Secondary | ICD-10-CM | POA: Diagnosis not present

## 2018-03-11 DIAGNOSIS — E785 Hyperlipidemia, unspecified: Secondary | ICD-10-CM | POA: Diagnosis not present

## 2018-03-11 DIAGNOSIS — I120 Hypertensive chronic kidney disease with stage 5 chronic kidney disease or end stage renal disease: Secondary | ICD-10-CM

## 2018-03-11 DIAGNOSIS — I5042 Chronic combined systolic (congestive) and diastolic (congestive) heart failure: Secondary | ICD-10-CM | POA: Diagnosis not present

## 2018-03-11 DIAGNOSIS — E1122 Type 2 diabetes mellitus with diabetic chronic kidney disease: Secondary | ICD-10-CM | POA: Diagnosis not present

## 2018-03-11 DIAGNOSIS — I2510 Atherosclerotic heart disease of native coronary artery without angina pectoris: Secondary | ICD-10-CM | POA: Diagnosis not present

## 2018-03-11 DIAGNOSIS — Z992 Dependence on renal dialysis: Secondary | ICD-10-CM | POA: Diagnosis not present

## 2018-03-11 DIAGNOSIS — E1165 Type 2 diabetes mellitus with hyperglycemia: Secondary | ICD-10-CM | POA: Diagnosis not present

## 2018-03-11 DIAGNOSIS — I4892 Unspecified atrial flutter: Secondary | ICD-10-CM | POA: Diagnosis not present

## 2018-03-11 MED ORDER — METOPROLOL TARTRATE 50 MG PO TABS: 50.0000 mg | ORAL_TABLET | Freq: Two times a day (BID) | ORAL | 3 refills | Status: DC

## 2018-03-11 NOTE — Patient Instructions (Signed)
Medication Instructions: Your physician has recommended you make the following change in your medication:   STOP TAKING AMIODARONE (P[place]one) STOP WARFARIN (Coumadin) INCREASE METOPROLOL TO 50 MG 2 TIMES A DAY (Take one whole pill in the morning and one whole pill in the evening)   Labwork: None ordered  Procedures/Testing: None ordered  Follow-Up: Your physician recommends that you schedule a follow-up appointment in: 1 month with Dr.Varanasi (1st available)   Any Additional Special Instructions Will Be Listed Below (If Applicable).  WE STRONGLY ADVISE YOU BE FOLLOWED BY A PRIMARY CARE PROVIDER FOR GENERAL HEALTH.   If you need a refill on your cardiac medications before your next appointment, please call your pharmacy.

## 2018-03-12 DIAGNOSIS — D509 Iron deficiency anemia, unspecified: Secondary | ICD-10-CM | POA: Diagnosis not present

## 2018-03-12 DIAGNOSIS — D631 Anemia in chronic kidney disease: Secondary | ICD-10-CM | POA: Diagnosis not present

## 2018-03-12 DIAGNOSIS — N2581 Secondary hyperparathyroidism of renal origin: Secondary | ICD-10-CM | POA: Diagnosis not present

## 2018-03-12 DIAGNOSIS — N186 End stage renal disease: Secondary | ICD-10-CM | POA: Diagnosis not present

## 2018-03-12 DIAGNOSIS — R4182 Altered mental status, unspecified: Secondary | ICD-10-CM | POA: Diagnosis not present

## 2018-03-12 DIAGNOSIS — E119 Type 2 diabetes mellitus without complications: Secondary | ICD-10-CM | POA: Diagnosis not present

## 2018-03-13 DIAGNOSIS — Z7901 Long term (current) use of anticoagulants: Secondary | ICD-10-CM | POA: Diagnosis not present

## 2018-03-13 DIAGNOSIS — I482 Chronic atrial fibrillation: Secondary | ICD-10-CM | POA: Diagnosis not present

## 2018-03-14 DIAGNOSIS — N186 End stage renal disease: Secondary | ICD-10-CM | POA: Diagnosis not present

## 2018-03-14 DIAGNOSIS — N2581 Secondary hyperparathyroidism of renal origin: Secondary | ICD-10-CM | POA: Diagnosis not present

## 2018-03-14 DIAGNOSIS — D631 Anemia in chronic kidney disease: Secondary | ICD-10-CM | POA: Diagnosis not present

## 2018-03-14 DIAGNOSIS — E119 Type 2 diabetes mellitus without complications: Secondary | ICD-10-CM | POA: Diagnosis not present

## 2018-03-14 DIAGNOSIS — R4182 Altered mental status, unspecified: Secondary | ICD-10-CM | POA: Diagnosis not present

## 2018-03-14 DIAGNOSIS — D509 Iron deficiency anemia, unspecified: Secondary | ICD-10-CM | POA: Diagnosis not present

## 2018-03-15 DIAGNOSIS — Z992 Dependence on renal dialysis: Secondary | ICD-10-CM | POA: Diagnosis not present

## 2018-03-15 DIAGNOSIS — N186 End stage renal disease: Secondary | ICD-10-CM | POA: Diagnosis not present

## 2018-03-15 DIAGNOSIS — E1129 Type 2 diabetes mellitus with other diabetic kidney complication: Secondary | ICD-10-CM | POA: Diagnosis not present

## 2018-03-17 DIAGNOSIS — D509 Iron deficiency anemia, unspecified: Secondary | ICD-10-CM | POA: Diagnosis not present

## 2018-03-17 DIAGNOSIS — N2581 Secondary hyperparathyroidism of renal origin: Secondary | ICD-10-CM | POA: Diagnosis not present

## 2018-03-17 DIAGNOSIS — D631 Anemia in chronic kidney disease: Secondary | ICD-10-CM | POA: Diagnosis not present

## 2018-03-17 DIAGNOSIS — E119 Type 2 diabetes mellitus without complications: Secondary | ICD-10-CM | POA: Diagnosis not present

## 2018-03-17 DIAGNOSIS — N186 End stage renal disease: Secondary | ICD-10-CM | POA: Diagnosis not present

## 2018-03-19 DIAGNOSIS — N2581 Secondary hyperparathyroidism of renal origin: Secondary | ICD-10-CM | POA: Diagnosis not present

## 2018-03-19 DIAGNOSIS — E119 Type 2 diabetes mellitus without complications: Secondary | ICD-10-CM | POA: Diagnosis not present

## 2018-03-19 DIAGNOSIS — D509 Iron deficiency anemia, unspecified: Secondary | ICD-10-CM | POA: Diagnosis not present

## 2018-03-19 DIAGNOSIS — D631 Anemia in chronic kidney disease: Secondary | ICD-10-CM | POA: Diagnosis not present

## 2018-03-19 DIAGNOSIS — N186 End stage renal disease: Secondary | ICD-10-CM | POA: Diagnosis not present

## 2018-03-20 DIAGNOSIS — E118 Type 2 diabetes mellitus with unspecified complications: Secondary | ICD-10-CM | POA: Diagnosis not present

## 2018-03-20 DIAGNOSIS — M6281 Muscle weakness (generalized): Secondary | ICD-10-CM | POA: Diagnosis not present

## 2018-03-20 DIAGNOSIS — Z992 Dependence on renal dialysis: Secondary | ICD-10-CM | POA: Diagnosis not present

## 2018-03-20 DIAGNOSIS — Z7901 Long term (current) use of anticoagulants: Secondary | ICD-10-CM | POA: Diagnosis not present

## 2018-03-20 DIAGNOSIS — G40909 Epilepsy, unspecified, not intractable, without status epilepticus: Secondary | ICD-10-CM | POA: Diagnosis not present

## 2018-03-20 DIAGNOSIS — J449 Chronic obstructive pulmonary disease, unspecified: Secondary | ICD-10-CM | POA: Diagnosis not present

## 2018-03-21 DIAGNOSIS — N186 End stage renal disease: Secondary | ICD-10-CM | POA: Diagnosis not present

## 2018-03-21 DIAGNOSIS — D509 Iron deficiency anemia, unspecified: Secondary | ICD-10-CM | POA: Diagnosis not present

## 2018-03-21 DIAGNOSIS — E119 Type 2 diabetes mellitus without complications: Secondary | ICD-10-CM | POA: Diagnosis not present

## 2018-03-21 DIAGNOSIS — D631 Anemia in chronic kidney disease: Secondary | ICD-10-CM | POA: Diagnosis not present

## 2018-03-21 DIAGNOSIS — N2581 Secondary hyperparathyroidism of renal origin: Secondary | ICD-10-CM | POA: Diagnosis not present

## 2018-03-24 DIAGNOSIS — N186 End stage renal disease: Secondary | ICD-10-CM | POA: Diagnosis not present

## 2018-03-24 DIAGNOSIS — D631 Anemia in chronic kidney disease: Secondary | ICD-10-CM | POA: Diagnosis not present

## 2018-03-24 DIAGNOSIS — D509 Iron deficiency anemia, unspecified: Secondary | ICD-10-CM | POA: Diagnosis not present

## 2018-03-24 DIAGNOSIS — N2581 Secondary hyperparathyroidism of renal origin: Secondary | ICD-10-CM | POA: Diagnosis not present

## 2018-03-24 DIAGNOSIS — E119 Type 2 diabetes mellitus without complications: Secondary | ICD-10-CM | POA: Diagnosis not present

## 2018-03-26 DIAGNOSIS — D631 Anemia in chronic kidney disease: Secondary | ICD-10-CM | POA: Diagnosis not present

## 2018-03-26 DIAGNOSIS — N186 End stage renal disease: Secondary | ICD-10-CM | POA: Diagnosis not present

## 2018-03-26 DIAGNOSIS — E119 Type 2 diabetes mellitus without complications: Secondary | ICD-10-CM | POA: Diagnosis not present

## 2018-03-26 DIAGNOSIS — N2581 Secondary hyperparathyroidism of renal origin: Secondary | ICD-10-CM | POA: Diagnosis not present

## 2018-03-26 DIAGNOSIS — D509 Iron deficiency anemia, unspecified: Secondary | ICD-10-CM | POA: Diagnosis not present

## 2018-03-27 DIAGNOSIS — I482 Chronic atrial fibrillation: Secondary | ICD-10-CM | POA: Diagnosis not present

## 2018-03-27 DIAGNOSIS — Z7901 Long term (current) use of anticoagulants: Secondary | ICD-10-CM | POA: Diagnosis not present

## 2018-03-28 DIAGNOSIS — D631 Anemia in chronic kidney disease: Secondary | ICD-10-CM | POA: Diagnosis not present

## 2018-03-28 DIAGNOSIS — N2581 Secondary hyperparathyroidism of renal origin: Secondary | ICD-10-CM | POA: Diagnosis not present

## 2018-03-28 DIAGNOSIS — E119 Type 2 diabetes mellitus without complications: Secondary | ICD-10-CM | POA: Diagnosis not present

## 2018-03-28 DIAGNOSIS — D509 Iron deficiency anemia, unspecified: Secondary | ICD-10-CM | POA: Diagnosis not present

## 2018-03-28 DIAGNOSIS — N186 End stage renal disease: Secondary | ICD-10-CM | POA: Diagnosis not present

## 2018-03-31 DIAGNOSIS — D509 Iron deficiency anemia, unspecified: Secondary | ICD-10-CM | POA: Diagnosis not present

## 2018-03-31 DIAGNOSIS — N2581 Secondary hyperparathyroidism of renal origin: Secondary | ICD-10-CM | POA: Diagnosis not present

## 2018-03-31 DIAGNOSIS — N186 End stage renal disease: Secondary | ICD-10-CM | POA: Diagnosis not present

## 2018-03-31 DIAGNOSIS — D631 Anemia in chronic kidney disease: Secondary | ICD-10-CM | POA: Diagnosis not present

## 2018-03-31 DIAGNOSIS — E119 Type 2 diabetes mellitus without complications: Secondary | ICD-10-CM | POA: Diagnosis not present

## 2018-04-01 DIAGNOSIS — E1122 Type 2 diabetes mellitus with diabetic chronic kidney disease: Secondary | ICD-10-CM | POA: Diagnosis not present

## 2018-04-01 DIAGNOSIS — I69351 Hemiplegia and hemiparesis following cerebral infarction affecting right dominant side: Secondary | ICD-10-CM | POA: Diagnosis not present

## 2018-04-01 DIAGNOSIS — I5042 Chronic combined systolic (congestive) and diastolic (congestive) heart failure: Secondary | ICD-10-CM | POA: Diagnosis not present

## 2018-04-01 DIAGNOSIS — I132 Hypertensive heart and chronic kidney disease with heart failure and with stage 5 chronic kidney disease, or end stage renal disease: Secondary | ICD-10-CM | POA: Diagnosis not present

## 2018-04-02 DIAGNOSIS — E119 Type 2 diabetes mellitus without complications: Secondary | ICD-10-CM | POA: Diagnosis not present

## 2018-04-02 DIAGNOSIS — N186 End stage renal disease: Secondary | ICD-10-CM | POA: Diagnosis not present

## 2018-04-02 DIAGNOSIS — D631 Anemia in chronic kidney disease: Secondary | ICD-10-CM | POA: Diagnosis not present

## 2018-04-02 DIAGNOSIS — N2581 Secondary hyperparathyroidism of renal origin: Secondary | ICD-10-CM | POA: Diagnosis not present

## 2018-04-02 DIAGNOSIS — D509 Iron deficiency anemia, unspecified: Secondary | ICD-10-CM | POA: Diagnosis not present

## 2018-04-03 DIAGNOSIS — I69351 Hemiplegia and hemiparesis following cerebral infarction affecting right dominant side: Secondary | ICD-10-CM | POA: Diagnosis not present

## 2018-04-03 DIAGNOSIS — I132 Hypertensive heart and chronic kidney disease with heart failure and with stage 5 chronic kidney disease, or end stage renal disease: Secondary | ICD-10-CM | POA: Diagnosis not present

## 2018-04-04 DIAGNOSIS — E119 Type 2 diabetes mellitus without complications: Secondary | ICD-10-CM | POA: Diagnosis not present

## 2018-04-04 DIAGNOSIS — N186 End stage renal disease: Secondary | ICD-10-CM | POA: Diagnosis not present

## 2018-04-04 DIAGNOSIS — D509 Iron deficiency anemia, unspecified: Secondary | ICD-10-CM | POA: Diagnosis not present

## 2018-04-04 DIAGNOSIS — N2581 Secondary hyperparathyroidism of renal origin: Secondary | ICD-10-CM | POA: Diagnosis not present

## 2018-04-04 DIAGNOSIS — D631 Anemia in chronic kidney disease: Secondary | ICD-10-CM | POA: Diagnosis not present

## 2018-04-07 ENCOUNTER — Ambulatory Visit: Payer: Medicare Other | Admitting: Interventional Cardiology

## 2018-04-07 DIAGNOSIS — N2581 Secondary hyperparathyroidism of renal origin: Secondary | ICD-10-CM | POA: Diagnosis not present

## 2018-04-07 DIAGNOSIS — E119 Type 2 diabetes mellitus without complications: Secondary | ICD-10-CM | POA: Diagnosis not present

## 2018-04-07 DIAGNOSIS — D631 Anemia in chronic kidney disease: Secondary | ICD-10-CM | POA: Diagnosis not present

## 2018-04-07 DIAGNOSIS — R0989 Other specified symptoms and signs involving the circulatory and respiratory systems: Secondary | ICD-10-CM

## 2018-04-07 DIAGNOSIS — D509 Iron deficiency anemia, unspecified: Secondary | ICD-10-CM | POA: Diagnosis not present

## 2018-04-07 DIAGNOSIS — N186 End stage renal disease: Secondary | ICD-10-CM | POA: Diagnosis not present

## 2018-04-08 ENCOUNTER — Encounter: Payer: Self-pay | Admitting: Interventional Cardiology

## 2018-04-08 DIAGNOSIS — I69351 Hemiplegia and hemiparesis following cerebral infarction affecting right dominant side: Secondary | ICD-10-CM | POA: Diagnosis not present

## 2018-04-08 DIAGNOSIS — I132 Hypertensive heart and chronic kidney disease with heart failure and with stage 5 chronic kidney disease, or end stage renal disease: Secondary | ICD-10-CM | POA: Diagnosis not present

## 2018-04-09 DIAGNOSIS — N186 End stage renal disease: Secondary | ICD-10-CM | POA: Diagnosis not present

## 2018-04-09 DIAGNOSIS — D631 Anemia in chronic kidney disease: Secondary | ICD-10-CM | POA: Diagnosis not present

## 2018-04-09 DIAGNOSIS — D509 Iron deficiency anemia, unspecified: Secondary | ICD-10-CM | POA: Diagnosis not present

## 2018-04-09 DIAGNOSIS — E119 Type 2 diabetes mellitus without complications: Secondary | ICD-10-CM | POA: Diagnosis not present

## 2018-04-09 DIAGNOSIS — N2581 Secondary hyperparathyroidism of renal origin: Secondary | ICD-10-CM | POA: Diagnosis not present

## 2018-04-10 DIAGNOSIS — I69351 Hemiplegia and hemiparesis following cerebral infarction affecting right dominant side: Secondary | ICD-10-CM | POA: Diagnosis not present

## 2018-04-10 DIAGNOSIS — I132 Hypertensive heart and chronic kidney disease with heart failure and with stage 5 chronic kidney disease, or end stage renal disease: Secondary | ICD-10-CM | POA: Diagnosis not present

## 2018-04-11 DIAGNOSIS — D509 Iron deficiency anemia, unspecified: Secondary | ICD-10-CM | POA: Diagnosis not present

## 2018-04-11 DIAGNOSIS — N186 End stage renal disease: Secondary | ICD-10-CM | POA: Diagnosis not present

## 2018-04-11 DIAGNOSIS — N2581 Secondary hyperparathyroidism of renal origin: Secondary | ICD-10-CM | POA: Diagnosis not present

## 2018-04-11 DIAGNOSIS — E119 Type 2 diabetes mellitus without complications: Secondary | ICD-10-CM | POA: Diagnosis not present

## 2018-04-11 DIAGNOSIS — D631 Anemia in chronic kidney disease: Secondary | ICD-10-CM | POA: Diagnosis not present

## 2018-04-14 DIAGNOSIS — I69351 Hemiplegia and hemiparesis following cerebral infarction affecting right dominant side: Secondary | ICD-10-CM | POA: Diagnosis not present

## 2018-04-14 DIAGNOSIS — N186 End stage renal disease: Secondary | ICD-10-CM | POA: Diagnosis not present

## 2018-04-14 DIAGNOSIS — D509 Iron deficiency anemia, unspecified: Secondary | ICD-10-CM | POA: Diagnosis not present

## 2018-04-14 DIAGNOSIS — E119 Type 2 diabetes mellitus without complications: Secondary | ICD-10-CM | POA: Diagnosis not present

## 2018-04-14 DIAGNOSIS — Z992 Dependence on renal dialysis: Secondary | ICD-10-CM | POA: Diagnosis not present

## 2018-04-14 DIAGNOSIS — E1129 Type 2 diabetes mellitus with other diabetic kidney complication: Secondary | ICD-10-CM | POA: Diagnosis not present

## 2018-04-14 DIAGNOSIS — D631 Anemia in chronic kidney disease: Secondary | ICD-10-CM | POA: Diagnosis not present

## 2018-04-14 DIAGNOSIS — N2581 Secondary hyperparathyroidism of renal origin: Secondary | ICD-10-CM | POA: Diagnosis not present

## 2018-04-14 DIAGNOSIS — I132 Hypertensive heart and chronic kidney disease with heart failure and with stage 5 chronic kidney disease, or end stage renal disease: Secondary | ICD-10-CM | POA: Diagnosis not present

## 2018-04-15 DIAGNOSIS — I132 Hypertensive heart and chronic kidney disease with heart failure and with stage 5 chronic kidney disease, or end stage renal disease: Secondary | ICD-10-CM | POA: Diagnosis not present

## 2018-04-15 DIAGNOSIS — I69351 Hemiplegia and hemiparesis following cerebral infarction affecting right dominant side: Secondary | ICD-10-CM | POA: Diagnosis not present

## 2018-04-16 DIAGNOSIS — N2581 Secondary hyperparathyroidism of renal origin: Secondary | ICD-10-CM | POA: Diagnosis not present

## 2018-04-16 DIAGNOSIS — D631 Anemia in chronic kidney disease: Secondary | ICD-10-CM | POA: Diagnosis not present

## 2018-04-16 DIAGNOSIS — E119 Type 2 diabetes mellitus without complications: Secondary | ICD-10-CM | POA: Diagnosis not present

## 2018-04-16 DIAGNOSIS — N186 End stage renal disease: Secondary | ICD-10-CM | POA: Diagnosis not present

## 2018-04-16 DIAGNOSIS — D509 Iron deficiency anemia, unspecified: Secondary | ICD-10-CM | POA: Diagnosis not present

## 2018-04-17 DIAGNOSIS — I132 Hypertensive heart and chronic kidney disease with heart failure and with stage 5 chronic kidney disease, or end stage renal disease: Secondary | ICD-10-CM | POA: Diagnosis not present

## 2018-04-17 DIAGNOSIS — I69351 Hemiplegia and hemiparesis following cerebral infarction affecting right dominant side: Secondary | ICD-10-CM | POA: Diagnosis not present

## 2018-04-18 DIAGNOSIS — D631 Anemia in chronic kidney disease: Secondary | ICD-10-CM | POA: Diagnosis not present

## 2018-04-18 DIAGNOSIS — D509 Iron deficiency anemia, unspecified: Secondary | ICD-10-CM | POA: Diagnosis not present

## 2018-04-18 DIAGNOSIS — N186 End stage renal disease: Secondary | ICD-10-CM | POA: Diagnosis not present

## 2018-04-18 DIAGNOSIS — N2581 Secondary hyperparathyroidism of renal origin: Secondary | ICD-10-CM | POA: Diagnosis not present

## 2018-04-18 DIAGNOSIS — E119 Type 2 diabetes mellitus without complications: Secondary | ICD-10-CM | POA: Diagnosis not present

## 2018-04-21 DIAGNOSIS — D631 Anemia in chronic kidney disease: Secondary | ICD-10-CM | POA: Diagnosis not present

## 2018-04-21 DIAGNOSIS — N2581 Secondary hyperparathyroidism of renal origin: Secondary | ICD-10-CM | POA: Diagnosis not present

## 2018-04-21 DIAGNOSIS — D509 Iron deficiency anemia, unspecified: Secondary | ICD-10-CM | POA: Diagnosis not present

## 2018-04-21 DIAGNOSIS — N186 End stage renal disease: Secondary | ICD-10-CM | POA: Diagnosis not present

## 2018-04-21 DIAGNOSIS — E119 Type 2 diabetes mellitus without complications: Secondary | ICD-10-CM | POA: Diagnosis not present

## 2018-04-22 ENCOUNTER — Ambulatory Visit (INDEPENDENT_AMBULATORY_CARE_PROVIDER_SITE_OTHER): Payer: Medicare Other | Admitting: Neurology

## 2018-04-22 ENCOUNTER — Encounter: Payer: Self-pay | Admitting: Neurology

## 2018-04-22 VITALS — BP 108/60 | HR 84 | Resp 14 | Ht 69.0 in | Wt 190.0 lb

## 2018-04-22 DIAGNOSIS — G40209 Localization-related (focal) (partial) symptomatic epilepsy and epileptic syndromes with complex partial seizures, not intractable, without status epilepticus: Secondary | ICD-10-CM

## 2018-04-22 DIAGNOSIS — I2510 Atherosclerotic heart disease of native coronary artery without angina pectoris: Secondary | ICD-10-CM | POA: Diagnosis not present

## 2018-04-22 NOTE — Patient Instructions (Addendum)
Continue levetiracetam 1000mg  daily and 500mg  after each dialysis Follow up in 9 months

## 2018-04-22 NOTE — Progress Notes (Signed)
NEUROLOGY FOLLOW UP OFFICE NOTE  Ernest [name] [id number]  HISTORY OF PRESENT ILLNESS: Ernest [name] is a 72 year old right-handed man with hyperlipidemia, ESRD on HD (M/W/F), type II diabetes mellitus, hypertension, COPD, gout, chronic CHF, coronary artery disease, hepatitis C, penile implant (unable to get MRI) and history of stroke who follows up for stroke and seizures.   UPDATE: He is taking Keppra 1000mg  daily and 500mg  following dialysis.  No recent seizures.  He developed persistent atrial flutter and was started on amiodarone and Coumadin.  Coumadin was discontinued due to noncompliance and amiodarone failed to convert.  Electrical cardioversion is being considered.  HISTORY: Around October 2015, he developed slurred speech. He also experienced ringing in his right ear, with sensation of water in his right ear. He did not seek medical attention at the time. There was no facial droop or difficulty walking or swallowing. He decided to go to the ED on 09/21/14 for an evaluation as the slurred speech and tinnitus persisted. He denied weakness but ED exam revealed right arm weakness. CT of the head was performed, which revealed chronic small vessel ischemic changes, including remote right cerebellar, left pontine and right basal ganglia lacunar infarcts. No acute infarction was seen.   On 04/25/15, he developed right hand cramping and dystonia. When EMS arrived, he had jerking of the right hand that then generalized to a tonic-clonic seizure. He had two other seizures, in the ambulance and in the ED, lasting 30-40 seconds. CT of the head showed chronic small vessel disease and moderate brain atrophy. He was started on Keppra 500mg  twice daily.  He was admitted to [facility] on 04/29/15 for seizure, which was thought to be secondary to hyperglycemia with a blood glucose level of 662.  MRI of the brain was withheld because he had a penile implant that contains metal.  Keppra  was increased to 1000mg  daily with 500mg  following each dialysis.  He was re-admitted to [facility] again on 07/11/15 for presumed acute right pontine infarct.  He presented with diplopia and unsteady gait.  On exam, he was found to have one and a half syndrome.  CT of head and repeat CT of head did not reveal acute infarct. 2D echo showed EF 45-50% with grade 1 diastolic dysfunction.  Carotid doppler showed no hemodynamically significant ICA stenosis  He was admitted to [facility] from 10/27/16 to 10/29/16 for 2 to 3 days of persistent left leg weakness.  He did not have other associated symptoms, such as other extremity weakness, numbness, visual disturbance, unsteady gait or worsening of residual dysarthria.  Serum glucose was 263.  CT of head was personally reviewed and was negative for acute findings.  Unable to obtain MRI due to metal penile implant, so repeat head the CT on the following day was also negative.  Carotid doppler was negative for hemodynamically significant ICA stenosis.  LDL was 50.  Hgb A1c was elevated at 8.7.  Weakness was thought to be due to hypotension during HD.  He was advised to stop Lisinopril but otherwise continue is current regimen with dual antiplatelet therapy.  PAST MEDICAL HISTORY: Past Medical History:  Diagnosis Date  . Abnormal stress test    s/p cath November 2013 with modest disease involving the ostial left main, proximal LAD, proximal RCA - do not appear to be hemodynamically signficant; mild LV dysfunction  . Bacteremia   . Chronic systolic CHF (congestive heart failure) ([place])   . COPD (chronic obstructive pulmonary  disease) ([place])   . Coronary artery disease    a. per cath report 2013, b. 09/13/2017-CABG X4 & Mitral valve repair  . Ejection fraction < 50%   . Erectile dysfunction    penile implant  . ESRD (end stage renal disease) ([place])    Started HD in [place] in 2009, ESRD was due to DM. Moved to [facility] in Dec 2009 and now gets dialysis at [facility]  on a MWF schedule.     . Gout    Hx: of  . Hepatitis C   . Hyperlipidemia   . Hypertension   . Mitral valve disease    s/p Mitral repair 09/13/17  . Obesity   . Retinopathy   . Shortness of breath    Hx: of with exertion  . Stroke ([place])   . Type II or unspecified type diabetes mellitus without mention of complication, not stated as uncontrolled    adult onset    MEDICATIONS: Current Outpatient Medications on File Prior to Visit  Medication Sig Dispense Refill  . aspirin EC 81 MG tablet Take 1 tablet (81 mg total) by mouth daily. 30 tablet 11  . atorvastatin (LIPITOR) 40 MG tablet Take 40 mg by mouth daily.     . B Complex-C-Folic Acid (NEPHRO-VITE PO) Take 1 tablet by mouth daily.    . calcium acetate (PHOSLO) 667 MG capsule Take 667 mg by mouth 3 (three) times daily with meals.     . cinacalcet (SENSIPAR) 30 MG tablet Take 4 tablets (120 mg total) by mouth daily with supper. 60 tablet   . Darbepoetin Alfa (ARANESP) 100 MCG/0.5ML SOSY injection Inject 100 mcg into the skin every 7 (seven) days. Administered with dialysis    . doxercalciferol (HECTOROL) 4 MCG/2ML injection Inject 2 mcg into the vein every Monday, Wednesday, and Friday with hemodialysis. Administered at dialysis    . ferric citrate (AURYXIA) 1 GM 210 MG(Fe) tablet Take 420 mg by mouth 3 (three) times daily with meals.     . ferric gluconate 125 mg in sodium chloride 0.9 % 100 mL Inject 250 mg into the vein every Monday, Wednesday, and Friday with hemodialysis.     [name] insulin detemir (LEVEMIR) 100 UNIT/ML injection Inject 0.2 mLs (20 Units total) into the skin 2 (two) times daily. 10 mL 11  . levETIRAcetam (KEPPRA) 500 MG tablet Take 2 tablets (1,000 mg total) by mouth daily. Then take extra 500 mg (1 tablet) on MWF after HD 72 tablet 5  . linaclotide (LINZESS) 145 MCG CAPS capsule Take 145 mcg by mouth daily.    . metoprolol tartrate (LOPRESSOR) 50 MG tablet Take 1 tablet (50 mg total) by mouth 2 (two) times daily. 180  tablet 3  . Nutritional Supplements (FEEDING SUPPLEMENT, NEPRO CARB STEADY,) LIQD Take 237 mLs by mouth 2 (two) times daily between meals.  0  . sennosides-docusate sodium (SENOKOT-S) 8.6-50 MG tablet Take 2 tablets by mouth 2 (two) times daily.    . sertraline (ZOLOFT) 25 MG tablet Take 25 mg by mouth at bedtime.     No current facility-administered medications on file prior to visit.     ALLERGIES: [name]  FAMILY HISTORY: Family History  Problem Relation Age of Onset  . Heart disease Father   . Diabetes Sister   . Diabetes Brother   . Diabetes Son     SOCIAL HISTORY: Social History   Socioeconomic History  . Marital status: Married    Spouse name: Not  on file  . Number of children: Not on file  . Years of education: Not on file  . Highest education level: Not on file  Occupational History  . Occupation: Cabin crew: DISABLED    Comment: disabled  Social Needs  . Financial resource strain: Not on file  . Food insecurity:    Worry: Not on file    Inability: Not on file  . Transportation needs:    Medical: Not on file    Non-medical: Not on file  Tobacco Use  . Smoking status: Former Smoker    Years: 7.00    Last attempt to quit: 06/10/1973    Years since quitting: 44.8  . Smokeless tobacco: Never Used  . Tobacco comment: Quit in 1974.  Substance and Sexual Activity  . Alcohol use: No    Alcohol/week: 0.0 oz  . Drug use: No  . Sexual activity: Not on file  Lifestyle  . Physical activity:    Days per week: Not on file    Minutes per session: Not on file  . Stress: Not on file  Relationships  . Social connections:    Talks on phone: Not on file    Gets together: Not on file    Attends religious service: Not on file    Active member of club or organization: Not on file    Attends meetings of clubs or organizations: Not on file    Relationship status: Not on file  . Intimate partner violence:    Fear of current or ex partner: Not  on file    Emotionally abused: Not on file    Physically abused: Not on file    Forced sexual activity: Not on file  Other Topics Concern  . Not on file  Social History Narrative   Adopted mentally retarded child at home, 2 adopted children, 3 living and 2 deceased.    REVIEW OF SYSTEMS: Constitutional: No fevers, chills, or sweats, no generalized fatigue, change in appetite Eyes: No visual changes, double vision, eye pain Ear, nose and throat: No hearing loss, ear pain, nasal congestion, sore throat Cardiovascular: No chest pain, palpitations Respiratory:  No shortness of breath at rest or with exertion, wheezes GastrointestinaI: No nausea, vomiting, diarrhea, abdominal pain, fecal incontinence Genitourinary:  No dysuria, urinary retention or frequency Musculoskeletal:  No neck pain, back pain Integumentary: No rash, pruritus, skin lesions Neurological: as above Psychiatric: No depression, insomnia, anxiety Endocrine: No palpitations, fatigue, diaphoresis, mood swings, change in appetite, change in weight, increased thirst Hematologic/Lymphatic:  No purpura, petechiae. Allergic/Immunologic: no itchy/runny eyes, nasal congestion, recent allergic reactions, rashes  PHYSICAL EXAM: Vitals:   04/22/18 1524  BP: 108/60  Pulse: 84  Resp: 14   General: No acute distress.  Head:  Normocephalic/atraumatic Eyes:  Fundi examined but not visualized Neck: supple, no paraspinal tenderness, full range of motion Heart:  Regular rate and rhythm Lungs:  Clear to auscultation bilaterally Back: No paraspinal tenderness Neurological Exam: alert and oriented to person, place, and time. Attention span and concentration intact, recent and remote memory intact, fund of knowledge intact.  Speech fluent and not dysarthric, language intact.  CN II-XII intact. Bulk and tone normal, muscle strength 5/5 throughout.  Sensation to light touch  intact.  Deep tendon reflexes absent throughout.  Finger to nose  testing intact.  In wheelchair   IMPRESSION: Symptomatic localization related seizure with secondary generalization CVA Uncontrolled type 2 diabetes Hyperlipidemia Hypertension CAD atrial flutter   PLAN:  1.  Keppra 1000mg  twice daily with 500mg  following dialysis 2.  ASA 81mg .  Follow up with cardiology regarding treatment of atrial flutter 3.  Continue statin therapy as per PCP (LDL goal less than 70) 4.  Maintain blood pressure control as per PCP 5.  Advised that he should go to the ED if he feels that he may be having a stroke. 6.  Optimize glycemic control as per PCP 7.  Follow up in 9 months.  20 minutes spent face to face with patient, over 50% spent discussing management.   Metta Clines, DO

## 2018-04-23 DIAGNOSIS — N2581 Secondary hyperparathyroidism of renal origin: Secondary | ICD-10-CM | POA: Diagnosis not present

## 2018-04-23 DIAGNOSIS — E119 Type 2 diabetes mellitus without complications: Secondary | ICD-10-CM | POA: Diagnosis not present

## 2018-04-23 DIAGNOSIS — D631 Anemia in chronic kidney disease: Secondary | ICD-10-CM | POA: Diagnosis not present

## 2018-04-23 DIAGNOSIS — D509 Iron deficiency anemia, unspecified: Secondary | ICD-10-CM | POA: Diagnosis not present

## 2018-04-23 DIAGNOSIS — N186 End stage renal disease: Secondary | ICD-10-CM | POA: Diagnosis not present

## 2018-04-24 DIAGNOSIS — I132 Hypertensive heart and chronic kidney disease with heart failure and with stage 5 chronic kidney disease, or end stage renal disease: Secondary | ICD-10-CM | POA: Diagnosis not present

## 2018-04-24 DIAGNOSIS — I69351 Hemiplegia and hemiparesis following cerebral infarction affecting right dominant side: Secondary | ICD-10-CM | POA: Diagnosis not present

## 2018-04-25 DIAGNOSIS — N186 End stage renal disease: Secondary | ICD-10-CM | POA: Diagnosis not present

## 2018-04-25 DIAGNOSIS — D631 Anemia in chronic kidney disease: Secondary | ICD-10-CM | POA: Diagnosis not present

## 2018-04-25 DIAGNOSIS — N2581 Secondary hyperparathyroidism of renal origin: Secondary | ICD-10-CM | POA: Diagnosis not present

## 2018-04-25 DIAGNOSIS — E119 Type 2 diabetes mellitus without complications: Secondary | ICD-10-CM | POA: Diagnosis not present

## 2018-04-25 DIAGNOSIS — D509 Iron deficiency anemia, unspecified: Secondary | ICD-10-CM | POA: Diagnosis not present

## 2018-04-28 DIAGNOSIS — D631 Anemia in chronic kidney disease: Secondary | ICD-10-CM | POA: Diagnosis not present

## 2018-04-28 DIAGNOSIS — E119 Type 2 diabetes mellitus without complications: Secondary | ICD-10-CM | POA: Diagnosis not present

## 2018-04-28 DIAGNOSIS — D509 Iron deficiency anemia, unspecified: Secondary | ICD-10-CM | POA: Diagnosis not present

## 2018-04-28 DIAGNOSIS — N2581 Secondary hyperparathyroidism of renal origin: Secondary | ICD-10-CM | POA: Diagnosis not present

## 2018-04-28 DIAGNOSIS — N186 End stage renal disease: Secondary | ICD-10-CM | POA: Diagnosis not present

## 2018-04-30 DIAGNOSIS — E119 Type 2 diabetes mellitus without complications: Secondary | ICD-10-CM | POA: Diagnosis not present

## 2018-04-30 DIAGNOSIS — D509 Iron deficiency anemia, unspecified: Secondary | ICD-10-CM | POA: Diagnosis not present

## 2018-04-30 DIAGNOSIS — N2581 Secondary hyperparathyroidism of renal origin: Secondary | ICD-10-CM | POA: Diagnosis not present

## 2018-04-30 DIAGNOSIS — N186 End stage renal disease: Secondary | ICD-10-CM | POA: Diagnosis not present

## 2018-04-30 DIAGNOSIS — D631 Anemia in chronic kidney disease: Secondary | ICD-10-CM | POA: Diagnosis not present

## 2018-05-01 DIAGNOSIS — E118 Type 2 diabetes mellitus with unspecified complications: Secondary | ICD-10-CM | POA: Diagnosis not present

## 2018-05-01 DIAGNOSIS — G40909 Epilepsy, unspecified, not intractable, without status epilepticus: Secondary | ICD-10-CM | POA: Diagnosis not present

## 2018-05-01 DIAGNOSIS — J449 Chronic obstructive pulmonary disease, unspecified: Secondary | ICD-10-CM | POA: Diagnosis not present

## 2018-05-01 DIAGNOSIS — I1310 Hypertensive heart and chronic kidney disease without heart failure, with stage 1 through stage 4 chronic kidney disease, or unspecified chronic kidney disease: Secondary | ICD-10-CM | POA: Diagnosis not present

## 2018-05-01 DIAGNOSIS — I6350 Cerebral infarction due to unspecified occlusion or stenosis of unspecified cerebral artery: Secondary | ICD-10-CM | POA: Diagnosis not present

## 2018-05-01 DIAGNOSIS — I4892 Unspecified atrial flutter: Secondary | ICD-10-CM | POA: Diagnosis not present

## 2018-05-02 DIAGNOSIS — D631 Anemia in chronic kidney disease: Secondary | ICD-10-CM | POA: Diagnosis not present

## 2018-05-02 DIAGNOSIS — N186 End stage renal disease: Secondary | ICD-10-CM | POA: Diagnosis not present

## 2018-05-02 DIAGNOSIS — D509 Iron deficiency anemia, unspecified: Secondary | ICD-10-CM | POA: Diagnosis not present

## 2018-05-02 DIAGNOSIS — N2581 Secondary hyperparathyroidism of renal origin: Secondary | ICD-10-CM | POA: Diagnosis not present

## 2018-05-02 DIAGNOSIS — E119 Type 2 diabetes mellitus without complications: Secondary | ICD-10-CM | POA: Diagnosis not present

## 2018-05-05 DIAGNOSIS — D631 Anemia in chronic kidney disease: Secondary | ICD-10-CM | POA: Diagnosis not present

## 2018-05-05 DIAGNOSIS — D509 Iron deficiency anemia, unspecified: Secondary | ICD-10-CM | POA: Diagnosis not present

## 2018-05-05 DIAGNOSIS — E119 Type 2 diabetes mellitus without complications: Secondary | ICD-10-CM | POA: Diagnosis not present

## 2018-05-05 DIAGNOSIS — N186 End stage renal disease: Secondary | ICD-10-CM | POA: Diagnosis not present

## 2018-05-05 DIAGNOSIS — N2581 Secondary hyperparathyroidism of renal origin: Secondary | ICD-10-CM | POA: Diagnosis not present

## 2018-05-06 ENCOUNTER — Ambulatory Visit: Payer: Medicare Other | Admitting: Podiatry

## 2018-05-07 DIAGNOSIS — E1129 Type 2 diabetes mellitus with other diabetic kidney complication: Secondary | ICD-10-CM | POA: Diagnosis not present

## 2018-05-07 DIAGNOSIS — D509 Iron deficiency anemia, unspecified: Secondary | ICD-10-CM | POA: Diagnosis not present

## 2018-05-07 DIAGNOSIS — D631 Anemia in chronic kidney disease: Secondary | ICD-10-CM | POA: Diagnosis not present

## 2018-05-07 DIAGNOSIS — N2581 Secondary hyperparathyroidism of renal origin: Secondary | ICD-10-CM | POA: Diagnosis not present

## 2018-05-07 DIAGNOSIS — N186 End stage renal disease: Secondary | ICD-10-CM | POA: Diagnosis not present

## 2018-05-07 DIAGNOSIS — E119 Type 2 diabetes mellitus without complications: Secondary | ICD-10-CM | POA: Diagnosis not present

## 2018-05-09 DIAGNOSIS — N2581 Secondary hyperparathyroidism of renal origin: Secondary | ICD-10-CM | POA: Diagnosis not present

## 2018-05-09 DIAGNOSIS — D631 Anemia in chronic kidney disease: Secondary | ICD-10-CM | POA: Diagnosis not present

## 2018-05-09 DIAGNOSIS — E119 Type 2 diabetes mellitus without complications: Secondary | ICD-10-CM | POA: Diagnosis not present

## 2018-05-09 DIAGNOSIS — N186 End stage renal disease: Secondary | ICD-10-CM | POA: Diagnosis not present

## 2018-05-09 DIAGNOSIS — D509 Iron deficiency anemia, unspecified: Secondary | ICD-10-CM | POA: Diagnosis not present

## 2018-05-12 DIAGNOSIS — N186 End stage renal disease: Secondary | ICD-10-CM | POA: Diagnosis not present

## 2018-05-12 DIAGNOSIS — E119 Type 2 diabetes mellitus without complications: Secondary | ICD-10-CM | POA: Diagnosis not present

## 2018-05-12 DIAGNOSIS — D509 Iron deficiency anemia, unspecified: Secondary | ICD-10-CM | POA: Diagnosis not present

## 2018-05-12 DIAGNOSIS — N2581 Secondary hyperparathyroidism of renal origin: Secondary | ICD-10-CM | POA: Diagnosis not present

## 2018-05-12 DIAGNOSIS — D631 Anemia in chronic kidney disease: Secondary | ICD-10-CM | POA: Diagnosis not present

## 2018-05-14 DIAGNOSIS — D509 Iron deficiency anemia, unspecified: Secondary | ICD-10-CM | POA: Diagnosis not present

## 2018-05-14 DIAGNOSIS — D631 Anemia in chronic kidney disease: Secondary | ICD-10-CM | POA: Diagnosis not present

## 2018-05-14 DIAGNOSIS — N2581 Secondary hyperparathyroidism of renal origin: Secondary | ICD-10-CM | POA: Diagnosis not present

## 2018-05-14 DIAGNOSIS — E119 Type 2 diabetes mellitus without complications: Secondary | ICD-10-CM | POA: Diagnosis not present

## 2018-05-14 DIAGNOSIS — N186 End stage renal disease: Secondary | ICD-10-CM | POA: Diagnosis not present

## 2018-05-15 DIAGNOSIS — N186 End stage renal disease: Secondary | ICD-10-CM | POA: Diagnosis not present

## 2018-05-15 DIAGNOSIS — Z992 Dependence on renal dialysis: Secondary | ICD-10-CM | POA: Diagnosis not present

## 2018-05-15 DIAGNOSIS — E1129 Type 2 diabetes mellitus with other diabetic kidney complication: Secondary | ICD-10-CM | POA: Diagnosis not present

## 2018-05-16 DIAGNOSIS — D509 Iron deficiency anemia, unspecified: Secondary | ICD-10-CM | POA: Diagnosis not present

## 2018-05-16 DIAGNOSIS — N186 End stage renal disease: Secondary | ICD-10-CM | POA: Diagnosis not present

## 2018-05-16 DIAGNOSIS — E119 Type 2 diabetes mellitus without complications: Secondary | ICD-10-CM | POA: Diagnosis not present

## 2018-05-16 DIAGNOSIS — N2581 Secondary hyperparathyroidism of renal origin: Secondary | ICD-10-CM | POA: Diagnosis not present

## 2018-05-16 DIAGNOSIS — D631 Anemia in chronic kidney disease: Secondary | ICD-10-CM | POA: Diagnosis not present

## 2018-05-19 DIAGNOSIS — D509 Iron deficiency anemia, unspecified: Secondary | ICD-10-CM | POA: Diagnosis not present

## 2018-05-19 DIAGNOSIS — E119 Type 2 diabetes mellitus without complications: Secondary | ICD-10-CM | POA: Diagnosis not present

## 2018-05-19 DIAGNOSIS — D631 Anemia in chronic kidney disease: Secondary | ICD-10-CM | POA: Diagnosis not present

## 2018-05-19 DIAGNOSIS — N186 End stage renal disease: Secondary | ICD-10-CM | POA: Diagnosis not present

## 2018-05-19 DIAGNOSIS — N2581 Secondary hyperparathyroidism of renal origin: Secondary | ICD-10-CM | POA: Diagnosis not present

## 2018-05-20 DIAGNOSIS — I871 Compression of vein: Secondary | ICD-10-CM | POA: Diagnosis not present

## 2018-05-20 DIAGNOSIS — N186 End stage renal disease: Secondary | ICD-10-CM | POA: Diagnosis not present

## 2018-05-20 DIAGNOSIS — T82858A Stenosis of vascular prosthetic devices, implants and grafts, initial encounter: Secondary | ICD-10-CM | POA: Diagnosis not present

## 2018-05-20 DIAGNOSIS — Z992 Dependence on renal dialysis: Secondary | ICD-10-CM | POA: Diagnosis not present

## 2018-05-21 DIAGNOSIS — N186 End stage renal disease: Secondary | ICD-10-CM | POA: Diagnosis not present

## 2018-05-21 DIAGNOSIS — N2581 Secondary hyperparathyroidism of renal origin: Secondary | ICD-10-CM | POA: Diagnosis not present

## 2018-05-21 DIAGNOSIS — D509 Iron deficiency anemia, unspecified: Secondary | ICD-10-CM | POA: Diagnosis not present

## 2018-05-21 DIAGNOSIS — D631 Anemia in chronic kidney disease: Secondary | ICD-10-CM | POA: Diagnosis not present

## 2018-05-21 DIAGNOSIS — E119 Type 2 diabetes mellitus without complications: Secondary | ICD-10-CM | POA: Diagnosis not present

## 2018-05-23 DIAGNOSIS — N186 End stage renal disease: Secondary | ICD-10-CM | POA: Diagnosis not present

## 2018-05-23 DIAGNOSIS — N2581 Secondary hyperparathyroidism of renal origin: Secondary | ICD-10-CM | POA: Diagnosis not present

## 2018-05-23 DIAGNOSIS — D509 Iron deficiency anemia, unspecified: Secondary | ICD-10-CM | POA: Diagnosis not present

## 2018-05-23 DIAGNOSIS — E119 Type 2 diabetes mellitus without complications: Secondary | ICD-10-CM | POA: Diagnosis not present

## 2018-05-23 DIAGNOSIS — D631 Anemia in chronic kidney disease: Secondary | ICD-10-CM | POA: Diagnosis not present

## 2018-05-26 DIAGNOSIS — E119 Type 2 diabetes mellitus without complications: Secondary | ICD-10-CM | POA: Diagnosis not present

## 2018-05-26 DIAGNOSIS — N2581 Secondary hyperparathyroidism of renal origin: Secondary | ICD-10-CM | POA: Diagnosis not present

## 2018-05-26 DIAGNOSIS — N186 End stage renal disease: Secondary | ICD-10-CM | POA: Diagnosis not present

## 2018-05-26 DIAGNOSIS — D631 Anemia in chronic kidney disease: Secondary | ICD-10-CM | POA: Diagnosis not present

## 2018-05-26 DIAGNOSIS — D509 Iron deficiency anemia, unspecified: Secondary | ICD-10-CM | POA: Diagnosis not present

## 2018-05-27 DIAGNOSIS — H4720 Unspecified optic atrophy: Secondary | ICD-10-CM | POA: Diagnosis not present

## 2018-05-27 DIAGNOSIS — E113552 Type 2 diabetes mellitus with stable proliferative diabetic retinopathy, left eye: Secondary | ICD-10-CM | POA: Diagnosis not present

## 2018-05-27 DIAGNOSIS — E113551 Type 2 diabetes mellitus with stable proliferative diabetic retinopathy, right eye: Secondary | ICD-10-CM | POA: Diagnosis not present

## 2018-05-27 DIAGNOSIS — H3412 Central retinal artery occlusion, left eye: Secondary | ICD-10-CM | POA: Diagnosis not present

## 2018-05-28 DIAGNOSIS — E119 Type 2 diabetes mellitus without complications: Secondary | ICD-10-CM | POA: Diagnosis not present

## 2018-05-28 DIAGNOSIS — D631 Anemia in chronic kidney disease: Secondary | ICD-10-CM | POA: Diagnosis not present

## 2018-05-28 DIAGNOSIS — D509 Iron deficiency anemia, unspecified: Secondary | ICD-10-CM | POA: Diagnosis not present

## 2018-05-28 DIAGNOSIS — N186 End stage renal disease: Secondary | ICD-10-CM | POA: Diagnosis not present

## 2018-05-28 DIAGNOSIS — N2581 Secondary hyperparathyroidism of renal origin: Secondary | ICD-10-CM | POA: Diagnosis not present

## 2018-05-30 DIAGNOSIS — N2581 Secondary hyperparathyroidism of renal origin: Secondary | ICD-10-CM | POA: Diagnosis not present

## 2018-05-30 DIAGNOSIS — D509 Iron deficiency anemia, unspecified: Secondary | ICD-10-CM | POA: Diagnosis not present

## 2018-05-30 DIAGNOSIS — N186 End stage renal disease: Secondary | ICD-10-CM | POA: Diagnosis not present

## 2018-05-30 DIAGNOSIS — D631 Anemia in chronic kidney disease: Secondary | ICD-10-CM | POA: Diagnosis not present

## 2018-05-30 DIAGNOSIS — E119 Type 2 diabetes mellitus without complications: Secondary | ICD-10-CM | POA: Diagnosis not present

## 2018-06-02 DIAGNOSIS — E119 Type 2 diabetes mellitus without complications: Secondary | ICD-10-CM | POA: Diagnosis not present

## 2018-06-02 DIAGNOSIS — D631 Anemia in chronic kidney disease: Secondary | ICD-10-CM | POA: Diagnosis not present

## 2018-06-02 DIAGNOSIS — N2581 Secondary hyperparathyroidism of renal origin: Secondary | ICD-10-CM | POA: Diagnosis not present

## 2018-06-02 DIAGNOSIS — N186 End stage renal disease: Secondary | ICD-10-CM | POA: Diagnosis not present

## 2018-06-02 DIAGNOSIS — D509 Iron deficiency anemia, unspecified: Secondary | ICD-10-CM | POA: Diagnosis not present

## 2018-06-03 DIAGNOSIS — E118 Type 2 diabetes mellitus with unspecified complications: Secondary | ICD-10-CM | POA: Diagnosis not present

## 2018-06-03 DIAGNOSIS — Z7901 Long term (current) use of anticoagulants: Secondary | ICD-10-CM | POA: Diagnosis not present

## 2018-06-03 DIAGNOSIS — I6350 Cerebral infarction due to unspecified occlusion or stenosis of unspecified cerebral artery: Secondary | ICD-10-CM | POA: Diagnosis not present

## 2018-06-03 DIAGNOSIS — I509 Heart failure, unspecified: Secondary | ICD-10-CM | POA: Diagnosis not present

## 2018-06-03 DIAGNOSIS — N186 End stage renal disease: Secondary | ICD-10-CM | POA: Diagnosis not present

## 2018-06-03 DIAGNOSIS — M792 Neuralgia and neuritis, unspecified: Secondary | ICD-10-CM | POA: Diagnosis not present

## 2018-06-05 DIAGNOSIS — T82858A Stenosis of vascular prosthetic devices, implants and grafts, initial encounter: Secondary | ICD-10-CM | POA: Diagnosis not present

## 2018-06-05 DIAGNOSIS — E119 Type 2 diabetes mellitus without complications: Secondary | ICD-10-CM | POA: Diagnosis not present

## 2018-06-05 DIAGNOSIS — D631 Anemia in chronic kidney disease: Secondary | ICD-10-CM | POA: Diagnosis not present

## 2018-06-05 DIAGNOSIS — D509 Iron deficiency anemia, unspecified: Secondary | ICD-10-CM | POA: Diagnosis not present

## 2018-06-05 DIAGNOSIS — N2581 Secondary hyperparathyroidism of renal origin: Secondary | ICD-10-CM | POA: Diagnosis not present

## 2018-06-05 DIAGNOSIS — N186 End stage renal disease: Secondary | ICD-10-CM | POA: Diagnosis not present

## 2018-06-05 DIAGNOSIS — Z992 Dependence on renal dialysis: Secondary | ICD-10-CM | POA: Diagnosis not present

## 2018-06-05 DIAGNOSIS — T82868A Thrombosis of vascular prosthetic devices, implants and grafts, initial encounter: Secondary | ICD-10-CM | POA: Diagnosis not present

## 2018-06-06 DIAGNOSIS — N2581 Secondary hyperparathyroidism of renal origin: Secondary | ICD-10-CM | POA: Diagnosis not present

## 2018-06-06 DIAGNOSIS — D509 Iron deficiency anemia, unspecified: Secondary | ICD-10-CM | POA: Diagnosis not present

## 2018-06-06 DIAGNOSIS — D631 Anemia in chronic kidney disease: Secondary | ICD-10-CM | POA: Diagnosis not present

## 2018-06-06 DIAGNOSIS — N186 End stage renal disease: Secondary | ICD-10-CM | POA: Diagnosis not present

## 2018-06-06 DIAGNOSIS — E119 Type 2 diabetes mellitus without complications: Secondary | ICD-10-CM | POA: Diagnosis not present

## 2018-06-09 DIAGNOSIS — E119 Type 2 diabetes mellitus without complications: Secondary | ICD-10-CM | POA: Diagnosis not present

## 2018-06-09 DIAGNOSIS — D631 Anemia in chronic kidney disease: Secondary | ICD-10-CM | POA: Diagnosis not present

## 2018-06-09 DIAGNOSIS — N2581 Secondary hyperparathyroidism of renal origin: Secondary | ICD-10-CM | POA: Diagnosis not present

## 2018-06-09 DIAGNOSIS — D509 Iron deficiency anemia, unspecified: Secondary | ICD-10-CM | POA: Diagnosis not present

## 2018-06-09 DIAGNOSIS — N186 End stage renal disease: Secondary | ICD-10-CM | POA: Diagnosis not present

## 2018-06-11 ENCOUNTER — Ambulatory Visit: Payer: Medicare Other | Admitting: Podiatry

## 2018-06-11 DIAGNOSIS — E119 Type 2 diabetes mellitus without complications: Secondary | ICD-10-CM | POA: Diagnosis not present

## 2018-06-11 DIAGNOSIS — N186 End stage renal disease: Secondary | ICD-10-CM | POA: Diagnosis not present

## 2018-06-11 DIAGNOSIS — D509 Iron deficiency anemia, unspecified: Secondary | ICD-10-CM | POA: Diagnosis not present

## 2018-06-11 DIAGNOSIS — D631 Anemia in chronic kidney disease: Secondary | ICD-10-CM | POA: Diagnosis not present

## 2018-06-11 DIAGNOSIS — N2581 Secondary hyperparathyroidism of renal origin: Secondary | ICD-10-CM | POA: Diagnosis not present

## 2018-06-13 DIAGNOSIS — N2581 Secondary hyperparathyroidism of renal origin: Secondary | ICD-10-CM | POA: Diagnosis not present

## 2018-06-13 DIAGNOSIS — E119 Type 2 diabetes mellitus without complications: Secondary | ICD-10-CM | POA: Diagnosis not present

## 2018-06-13 DIAGNOSIS — D631 Anemia in chronic kidney disease: Secondary | ICD-10-CM | POA: Diagnosis not present

## 2018-06-13 DIAGNOSIS — N186 End stage renal disease: Secondary | ICD-10-CM | POA: Diagnosis not present

## 2018-06-13 DIAGNOSIS — D509 Iron deficiency anemia, unspecified: Secondary | ICD-10-CM | POA: Diagnosis not present

## 2018-06-15 DIAGNOSIS — N186 End stage renal disease: Secondary | ICD-10-CM | POA: Diagnosis not present

## 2018-06-15 DIAGNOSIS — Z992 Dependence on renal dialysis: Secondary | ICD-10-CM | POA: Diagnosis not present

## 2018-06-15 DIAGNOSIS — E1129 Type 2 diabetes mellitus with other diabetic kidney complication: Secondary | ICD-10-CM | POA: Diagnosis not present

## 2018-06-16 DIAGNOSIS — D509 Iron deficiency anemia, unspecified: Secondary | ICD-10-CM | POA: Diagnosis not present

## 2018-06-16 DIAGNOSIS — N186 End stage renal disease: Secondary | ICD-10-CM | POA: Diagnosis not present

## 2018-06-16 DIAGNOSIS — D631 Anemia in chronic kidney disease: Secondary | ICD-10-CM | POA: Diagnosis not present

## 2018-06-16 DIAGNOSIS — E119 Type 2 diabetes mellitus without complications: Secondary | ICD-10-CM | POA: Diagnosis not present

## 2018-06-16 DIAGNOSIS — Z23 Encounter for immunization: Secondary | ICD-10-CM | POA: Diagnosis not present

## 2018-06-16 DIAGNOSIS — N2581 Secondary hyperparathyroidism of renal origin: Secondary | ICD-10-CM | POA: Diagnosis not present

## 2018-06-19 DIAGNOSIS — D631 Anemia in chronic kidney disease: Secondary | ICD-10-CM | POA: Diagnosis not present

## 2018-06-19 DIAGNOSIS — D509 Iron deficiency anemia, unspecified: Secondary | ICD-10-CM | POA: Diagnosis not present

## 2018-06-19 DIAGNOSIS — Z23 Encounter for immunization: Secondary | ICD-10-CM | POA: Diagnosis not present

## 2018-06-19 DIAGNOSIS — N2581 Secondary hyperparathyroidism of renal origin: Secondary | ICD-10-CM | POA: Diagnosis not present

## 2018-06-19 DIAGNOSIS — N186 End stage renal disease: Secondary | ICD-10-CM | POA: Diagnosis not present

## 2018-06-19 DIAGNOSIS — E119 Type 2 diabetes mellitus without complications: Secondary | ICD-10-CM | POA: Diagnosis not present

## 2018-06-20 DIAGNOSIS — Z23 Encounter for immunization: Secondary | ICD-10-CM | POA: Diagnosis not present

## 2018-06-20 DIAGNOSIS — N186 End stage renal disease: Secondary | ICD-10-CM | POA: Diagnosis not present

## 2018-06-20 DIAGNOSIS — N2581 Secondary hyperparathyroidism of renal origin: Secondary | ICD-10-CM | POA: Diagnosis not present

## 2018-06-20 DIAGNOSIS — E119 Type 2 diabetes mellitus without complications: Secondary | ICD-10-CM | POA: Diagnosis not present

## 2018-06-20 DIAGNOSIS — D509 Iron deficiency anemia, unspecified: Secondary | ICD-10-CM | POA: Diagnosis not present

## 2018-06-20 DIAGNOSIS — D631 Anemia in chronic kidney disease: Secondary | ICD-10-CM | POA: Diagnosis not present

## 2018-06-23 DIAGNOSIS — Z23 Encounter for immunization: Secondary | ICD-10-CM | POA: Diagnosis not present

## 2018-06-23 DIAGNOSIS — E119 Type 2 diabetes mellitus without complications: Secondary | ICD-10-CM | POA: Diagnosis not present

## 2018-06-23 DIAGNOSIS — D509 Iron deficiency anemia, unspecified: Secondary | ICD-10-CM | POA: Diagnosis not present

## 2018-06-23 DIAGNOSIS — D631 Anemia in chronic kidney disease: Secondary | ICD-10-CM | POA: Diagnosis not present

## 2018-06-23 DIAGNOSIS — N2581 Secondary hyperparathyroidism of renal origin: Secondary | ICD-10-CM | POA: Diagnosis not present

## 2018-06-23 DIAGNOSIS — N186 End stage renal disease: Secondary | ICD-10-CM | POA: Diagnosis not present

## 2018-06-25 DIAGNOSIS — N186 End stage renal disease: Secondary | ICD-10-CM | POA: Diagnosis not present

## 2018-06-25 DIAGNOSIS — Z23 Encounter for immunization: Secondary | ICD-10-CM | POA: Diagnosis not present

## 2018-06-25 DIAGNOSIS — D509 Iron deficiency anemia, unspecified: Secondary | ICD-10-CM | POA: Diagnosis not present

## 2018-06-25 DIAGNOSIS — N2581 Secondary hyperparathyroidism of renal origin: Secondary | ICD-10-CM | POA: Diagnosis not present

## 2018-06-25 DIAGNOSIS — E119 Type 2 diabetes mellitus without complications: Secondary | ICD-10-CM | POA: Diagnosis not present

## 2018-06-25 DIAGNOSIS — D631 Anemia in chronic kidney disease: Secondary | ICD-10-CM | POA: Diagnosis not present

## 2018-06-27 DIAGNOSIS — N2581 Secondary hyperparathyroidism of renal origin: Secondary | ICD-10-CM | POA: Diagnosis not present

## 2018-06-27 DIAGNOSIS — D509 Iron deficiency anemia, unspecified: Secondary | ICD-10-CM | POA: Diagnosis not present

## 2018-06-27 DIAGNOSIS — N186 End stage renal disease: Secondary | ICD-10-CM | POA: Diagnosis not present

## 2018-06-27 DIAGNOSIS — E119 Type 2 diabetes mellitus without complications: Secondary | ICD-10-CM | POA: Diagnosis not present

## 2018-06-27 DIAGNOSIS — Z23 Encounter for immunization: Secondary | ICD-10-CM | POA: Diagnosis not present

## 2018-06-27 DIAGNOSIS — D631 Anemia in chronic kidney disease: Secondary | ICD-10-CM | POA: Diagnosis not present

## 2018-06-30 DIAGNOSIS — N2581 Secondary hyperparathyroidism of renal origin: Secondary | ICD-10-CM | POA: Diagnosis not present

## 2018-06-30 DIAGNOSIS — E119 Type 2 diabetes mellitus without complications: Secondary | ICD-10-CM | POA: Diagnosis not present

## 2018-06-30 DIAGNOSIS — D631 Anemia in chronic kidney disease: Secondary | ICD-10-CM | POA: Diagnosis not present

## 2018-06-30 DIAGNOSIS — Z23 Encounter for immunization: Secondary | ICD-10-CM | POA: Diagnosis not present

## 2018-06-30 DIAGNOSIS — N186 End stage renal disease: Secondary | ICD-10-CM | POA: Diagnosis not present

## 2018-06-30 DIAGNOSIS — D509 Iron deficiency anemia, unspecified: Secondary | ICD-10-CM | POA: Diagnosis not present

## 2018-07-02 DIAGNOSIS — D509 Iron deficiency anemia, unspecified: Secondary | ICD-10-CM | POA: Diagnosis not present

## 2018-07-02 DIAGNOSIS — D631 Anemia in chronic kidney disease: Secondary | ICD-10-CM | POA: Diagnosis not present

## 2018-07-02 DIAGNOSIS — E119 Type 2 diabetes mellitus without complications: Secondary | ICD-10-CM | POA: Diagnosis not present

## 2018-07-02 DIAGNOSIS — N2581 Secondary hyperparathyroidism of renal origin: Secondary | ICD-10-CM | POA: Diagnosis not present

## 2018-07-02 DIAGNOSIS — N186 End stage renal disease: Secondary | ICD-10-CM | POA: Diagnosis not present

## 2018-07-02 DIAGNOSIS — Z23 Encounter for immunization: Secondary | ICD-10-CM | POA: Diagnosis not present

## 2018-07-04 DIAGNOSIS — Z23 Encounter for immunization: Secondary | ICD-10-CM | POA: Diagnosis not present

## 2018-07-04 DIAGNOSIS — N186 End stage renal disease: Secondary | ICD-10-CM | POA: Diagnosis not present

## 2018-07-04 DIAGNOSIS — N2581 Secondary hyperparathyroidism of renal origin: Secondary | ICD-10-CM | POA: Diagnosis not present

## 2018-07-04 DIAGNOSIS — D509 Iron deficiency anemia, unspecified: Secondary | ICD-10-CM | POA: Diagnosis not present

## 2018-07-04 DIAGNOSIS — D631 Anemia in chronic kidney disease: Secondary | ICD-10-CM | POA: Diagnosis not present

## 2018-07-04 DIAGNOSIS — E119 Type 2 diabetes mellitus without complications: Secondary | ICD-10-CM | POA: Diagnosis not present

## 2018-07-07 DIAGNOSIS — N2581 Secondary hyperparathyroidism of renal origin: Secondary | ICD-10-CM | POA: Diagnosis not present

## 2018-07-07 DIAGNOSIS — N186 End stage renal disease: Secondary | ICD-10-CM | POA: Diagnosis not present

## 2018-07-07 DIAGNOSIS — D631 Anemia in chronic kidney disease: Secondary | ICD-10-CM | POA: Diagnosis not present

## 2018-07-07 DIAGNOSIS — D509 Iron deficiency anemia, unspecified: Secondary | ICD-10-CM | POA: Diagnosis not present

## 2018-07-07 DIAGNOSIS — E119 Type 2 diabetes mellitus without complications: Secondary | ICD-10-CM | POA: Diagnosis not present

## 2018-07-07 DIAGNOSIS — Z23 Encounter for immunization: Secondary | ICD-10-CM | POA: Diagnosis not present

## 2018-07-09 DIAGNOSIS — N186 End stage renal disease: Secondary | ICD-10-CM | POA: Diagnosis not present

## 2018-07-09 DIAGNOSIS — D631 Anemia in chronic kidney disease: Secondary | ICD-10-CM | POA: Diagnosis not present

## 2018-07-09 DIAGNOSIS — D509 Iron deficiency anemia, unspecified: Secondary | ICD-10-CM | POA: Diagnosis not present

## 2018-07-09 DIAGNOSIS — N2581 Secondary hyperparathyroidism of renal origin: Secondary | ICD-10-CM | POA: Diagnosis not present

## 2018-07-09 DIAGNOSIS — E119 Type 2 diabetes mellitus without complications: Secondary | ICD-10-CM | POA: Diagnosis not present

## 2018-07-09 DIAGNOSIS — Z23 Encounter for immunization: Secondary | ICD-10-CM | POA: Diagnosis not present

## 2018-07-11 DIAGNOSIS — E119 Type 2 diabetes mellitus without complications: Secondary | ICD-10-CM | POA: Diagnosis not present

## 2018-07-11 DIAGNOSIS — Z23 Encounter for immunization: Secondary | ICD-10-CM | POA: Diagnosis not present

## 2018-07-11 DIAGNOSIS — N2581 Secondary hyperparathyroidism of renal origin: Secondary | ICD-10-CM | POA: Diagnosis not present

## 2018-07-11 DIAGNOSIS — N186 End stage renal disease: Secondary | ICD-10-CM | POA: Diagnosis not present

## 2018-07-11 DIAGNOSIS — D631 Anemia in chronic kidney disease: Secondary | ICD-10-CM | POA: Diagnosis not present

## 2018-07-11 DIAGNOSIS — D509 Iron deficiency anemia, unspecified: Secondary | ICD-10-CM | POA: Diagnosis not present

## 2018-07-14 DIAGNOSIS — D509 Iron deficiency anemia, unspecified: Secondary | ICD-10-CM | POA: Diagnosis not present

## 2018-07-14 DIAGNOSIS — N186 End stage renal disease: Secondary | ICD-10-CM | POA: Diagnosis not present

## 2018-07-14 DIAGNOSIS — D631 Anemia in chronic kidney disease: Secondary | ICD-10-CM | POA: Diagnosis not present

## 2018-07-14 DIAGNOSIS — N2581 Secondary hyperparathyroidism of renal origin: Secondary | ICD-10-CM | POA: Diagnosis not present

## 2018-07-14 DIAGNOSIS — Z23 Encounter for immunization: Secondary | ICD-10-CM | POA: Diagnosis not present

## 2018-07-14 DIAGNOSIS — E119 Type 2 diabetes mellitus without complications: Secondary | ICD-10-CM | POA: Diagnosis not present

## 2018-07-15 DIAGNOSIS — N186 End stage renal disease: Secondary | ICD-10-CM | POA: Diagnosis not present

## 2018-07-15 DIAGNOSIS — E1129 Type 2 diabetes mellitus with other diabetic kidney complication: Secondary | ICD-10-CM | POA: Diagnosis not present

## 2018-07-15 DIAGNOSIS — Z992 Dependence on renal dialysis: Secondary | ICD-10-CM | POA: Diagnosis not present

## 2018-07-16 DIAGNOSIS — D631 Anemia in chronic kidney disease: Secondary | ICD-10-CM | POA: Diagnosis not present

## 2018-07-16 DIAGNOSIS — E119 Type 2 diabetes mellitus without complications: Secondary | ICD-10-CM | POA: Diagnosis not present

## 2018-07-16 DIAGNOSIS — D509 Iron deficiency anemia, unspecified: Secondary | ICD-10-CM | POA: Diagnosis not present

## 2018-07-16 DIAGNOSIS — N2581 Secondary hyperparathyroidism of renal origin: Secondary | ICD-10-CM | POA: Diagnosis not present

## 2018-07-16 DIAGNOSIS — N186 End stage renal disease: Secondary | ICD-10-CM | POA: Diagnosis not present

## 2018-07-18 DIAGNOSIS — N2581 Secondary hyperparathyroidism of renal origin: Secondary | ICD-10-CM | POA: Diagnosis not present

## 2018-07-18 DIAGNOSIS — D509 Iron deficiency anemia, unspecified: Secondary | ICD-10-CM | POA: Diagnosis not present

## 2018-07-18 DIAGNOSIS — D631 Anemia in chronic kidney disease: Secondary | ICD-10-CM | POA: Diagnosis not present

## 2018-07-18 DIAGNOSIS — E119 Type 2 diabetes mellitus without complications: Secondary | ICD-10-CM | POA: Diagnosis not present

## 2018-07-18 DIAGNOSIS — N186 End stage renal disease: Secondary | ICD-10-CM | POA: Diagnosis not present

## 2018-07-21 DIAGNOSIS — N186 End stage renal disease: Secondary | ICD-10-CM | POA: Diagnosis not present

## 2018-07-21 DIAGNOSIS — D509 Iron deficiency anemia, unspecified: Secondary | ICD-10-CM | POA: Diagnosis not present

## 2018-07-21 DIAGNOSIS — E119 Type 2 diabetes mellitus without complications: Secondary | ICD-10-CM | POA: Diagnosis not present

## 2018-07-21 DIAGNOSIS — N2581 Secondary hyperparathyroidism of renal origin: Secondary | ICD-10-CM | POA: Diagnosis not present

## 2018-07-21 DIAGNOSIS — D631 Anemia in chronic kidney disease: Secondary | ICD-10-CM | POA: Diagnosis not present

## 2018-07-23 DIAGNOSIS — D509 Iron deficiency anemia, unspecified: Secondary | ICD-10-CM | POA: Diagnosis not present

## 2018-07-23 DIAGNOSIS — N2581 Secondary hyperparathyroidism of renal origin: Secondary | ICD-10-CM | POA: Diagnosis not present

## 2018-07-23 DIAGNOSIS — N186 End stage renal disease: Secondary | ICD-10-CM | POA: Diagnosis not present

## 2018-07-23 DIAGNOSIS — E119 Type 2 diabetes mellitus without complications: Secondary | ICD-10-CM | POA: Diagnosis not present

## 2018-07-23 DIAGNOSIS — D631 Anemia in chronic kidney disease: Secondary | ICD-10-CM | POA: Diagnosis not present

## 2018-07-25 DIAGNOSIS — N186 End stage renal disease: Secondary | ICD-10-CM | POA: Diagnosis not present

## 2018-07-25 DIAGNOSIS — D631 Anemia in chronic kidney disease: Secondary | ICD-10-CM | POA: Diagnosis not present

## 2018-07-25 DIAGNOSIS — D509 Iron deficiency anemia, unspecified: Secondary | ICD-10-CM | POA: Diagnosis not present

## 2018-07-25 DIAGNOSIS — E119 Type 2 diabetes mellitus without complications: Secondary | ICD-10-CM | POA: Diagnosis not present

## 2018-07-25 DIAGNOSIS — N2581 Secondary hyperparathyroidism of renal origin: Secondary | ICD-10-CM | POA: Diagnosis not present

## 2018-07-28 DIAGNOSIS — N2581 Secondary hyperparathyroidism of renal origin: Secondary | ICD-10-CM | POA: Diagnosis not present

## 2018-07-28 DIAGNOSIS — D631 Anemia in chronic kidney disease: Secondary | ICD-10-CM | POA: Diagnosis not present

## 2018-07-28 DIAGNOSIS — N186 End stage renal disease: Secondary | ICD-10-CM | POA: Diagnosis not present

## 2018-07-28 DIAGNOSIS — D509 Iron deficiency anemia, unspecified: Secondary | ICD-10-CM | POA: Diagnosis not present

## 2018-07-28 DIAGNOSIS — E119 Type 2 diabetes mellitus without complications: Secondary | ICD-10-CM | POA: Diagnosis not present

## 2018-07-28 IMAGING — CT CT ANGIO NECK
1 of 12 series · 5 of 33 positions shown · IV contrast (OMNI 350)
Comparison: CT head 09/17/2017.  CTA head neck 10/28/2016.

CLINICAL DATA: New onset of inability to speak or cancer questions.
Lethargy. Evaluate for possible posterior circulation CVA. End-stage
renal disease.

EXAM:
CT ANGIOGRAPHY HEAD AND NECK
TECHNIQUE: Multidetector CT imaging of the head and neck was performed using
the standard protocol during bolus administration of intravenous
contrast. Multiplanar CT image reconstructions and MIPs were
obtained to evaluate the vascular anatomy. Carotid stenosis
measurements (when applicable) are obtained utilizing NASCET
criteria, using the distal internal carotid diameter as the
denominator.
CONTRAST:  100mL USV5TZ-2IU IOPAMIDOL (USV5TZ-2IU) INJECTION 76%

[Series 11: cta neck axial · axial · 0.39mm/px · z∈[-226,+26]mm · 5 of 379 slices shown]
[im 64/379  soft-tissue]
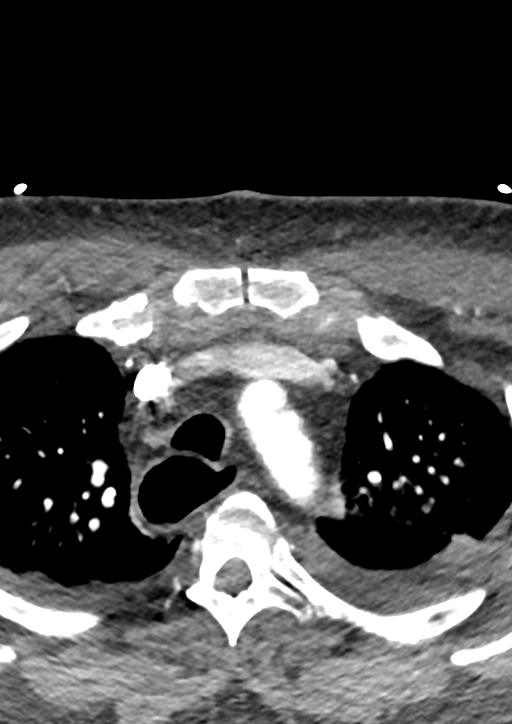
[im 127/379  bone]
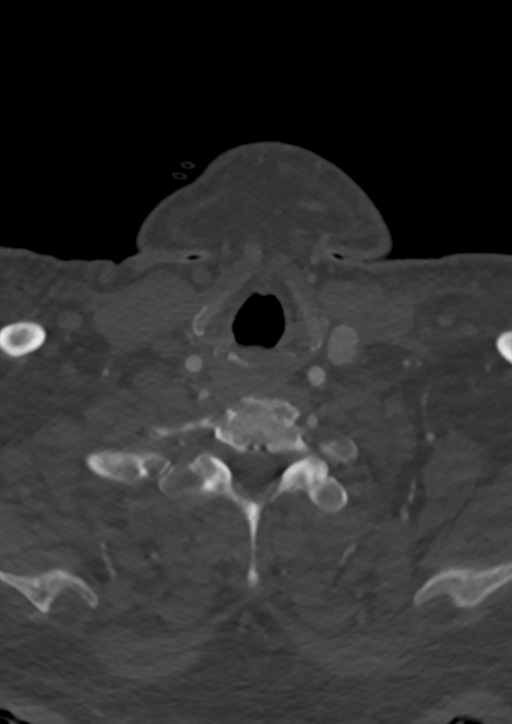
[im 190/379  soft-tissue]
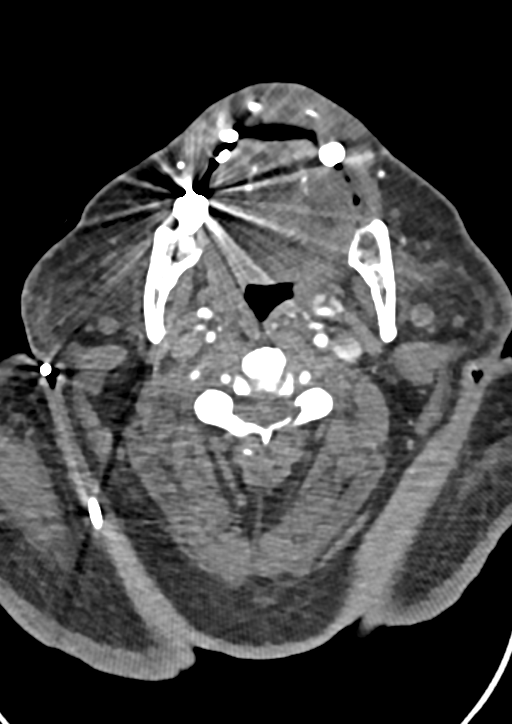
[im 253/379  bone]
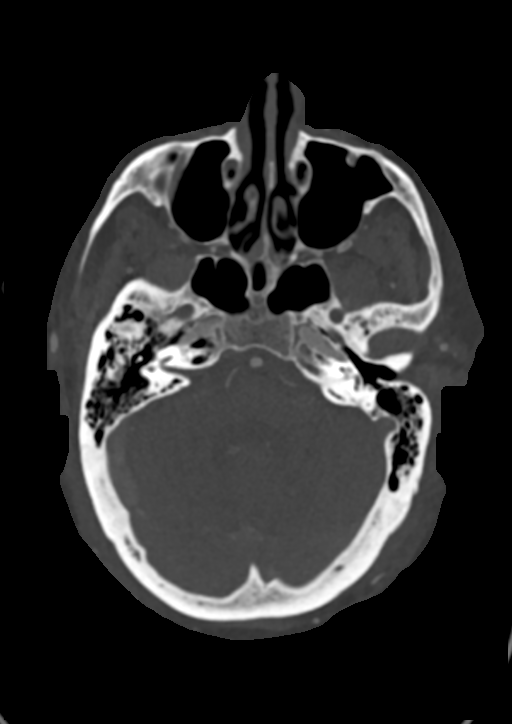
[im 316/379  soft-tissue]
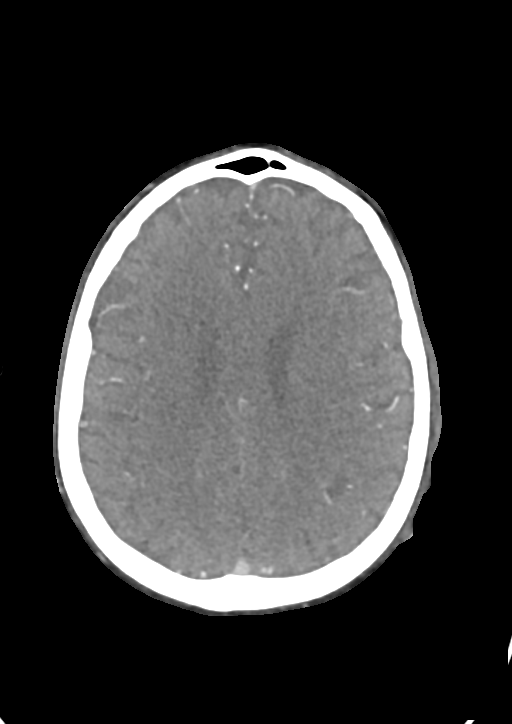

[5 of 33 positions shown; findings below may reference images not displayed]

FINDINGS: CT HEAD FINDINGS

Brain: No evidence of acute infarction, hemorrhage, hydrocephalus,
extra-axial collection or mass lesion/mass effect. Atrophy with
chronic microvascular ischemic change. Scattered parenchymal
vascular calcifications. BILATERAL chronic cerebellar infarcts.

Vascular: Reported separately.

Skull: Extensive scalp calcifications associated with ESRD. No
calvarial lesion.

Sinuses: Unremarkable.  BILATERAL cataract extraction.

Orbits: No acute finding.

Review of the MIP images confirms the above findings

CTA NECK FINDINGS

Aortic arch: Standard branching. Imaged portion shows no evidence of
aneurysm or dissection. No significant stenosis of the major arch
vessel origins.

Right carotid system: No evidence of dissection, stenosis (50% or
greater) or occlusion. Non stenotic calcific plaque is observed at
the carotid bifurcation.

Left carotid system: No evidence of dissection, stenosis (50% or
greater) or occlusion. Non stenotic calcific plaque is observed at
the carotid bifurcation.

Vertebral arteries: Codominant. No evidence of dissection, stenosis
(50% or greater) or occlusion. Non stenotic calcific plaque is
observed at the RIGHT vertebral origin.

Skeleton: Advanced spondylosis.

Other neck: No neck masses, other than the previously noted
calcified greater than 2 cm LEFT thyroid nodule.

Upper chest: Median sternotomy. Vascular congestion. BILATERAL
pleural effusions.

Review of the MIP images confirms the above findings

CTA HEAD FINDINGS

Anterior circulation: Non stenotic atheromatous change of the
cavernous and supraclinoid ICA segments. Hypoplastic RIGHT A1 ACA.
Normal BILATERAL middle cerebral arteries.

Posterior circulation: BILATERAL vertebral arteries are patent,
codominant. No basilar stenosis. PCA segments are normal. No
cerebellar branch occlusion.

Venous sinuses: As permitted by contrast timing, patent.

Anatomic variants: None.

Delayed phase: No abnormal enhancement.

Review of the MIP images confirms the above findings. Compared with
prior CTA, similar appearance.
IMPRESSION: Stable appearing intracranial and extracranial atherosclerotic
disease.

No flow-limiting stenosis or occlusion involving the carotid,
vertebral, or basilar arteries.

No abnormality is seen to suggest acute posterior circulation
ischemic change.

## 2018-07-29 IMAGING — DX DG CHEST 1V PORT
1 series · 1 of 1 positions shown · non-contrast
Comparison: Two days ago

CLINICAL DATA: Sore chest after CABG

EXAM:
PORTABLE CHEST 1 VIEW

[chest ap]
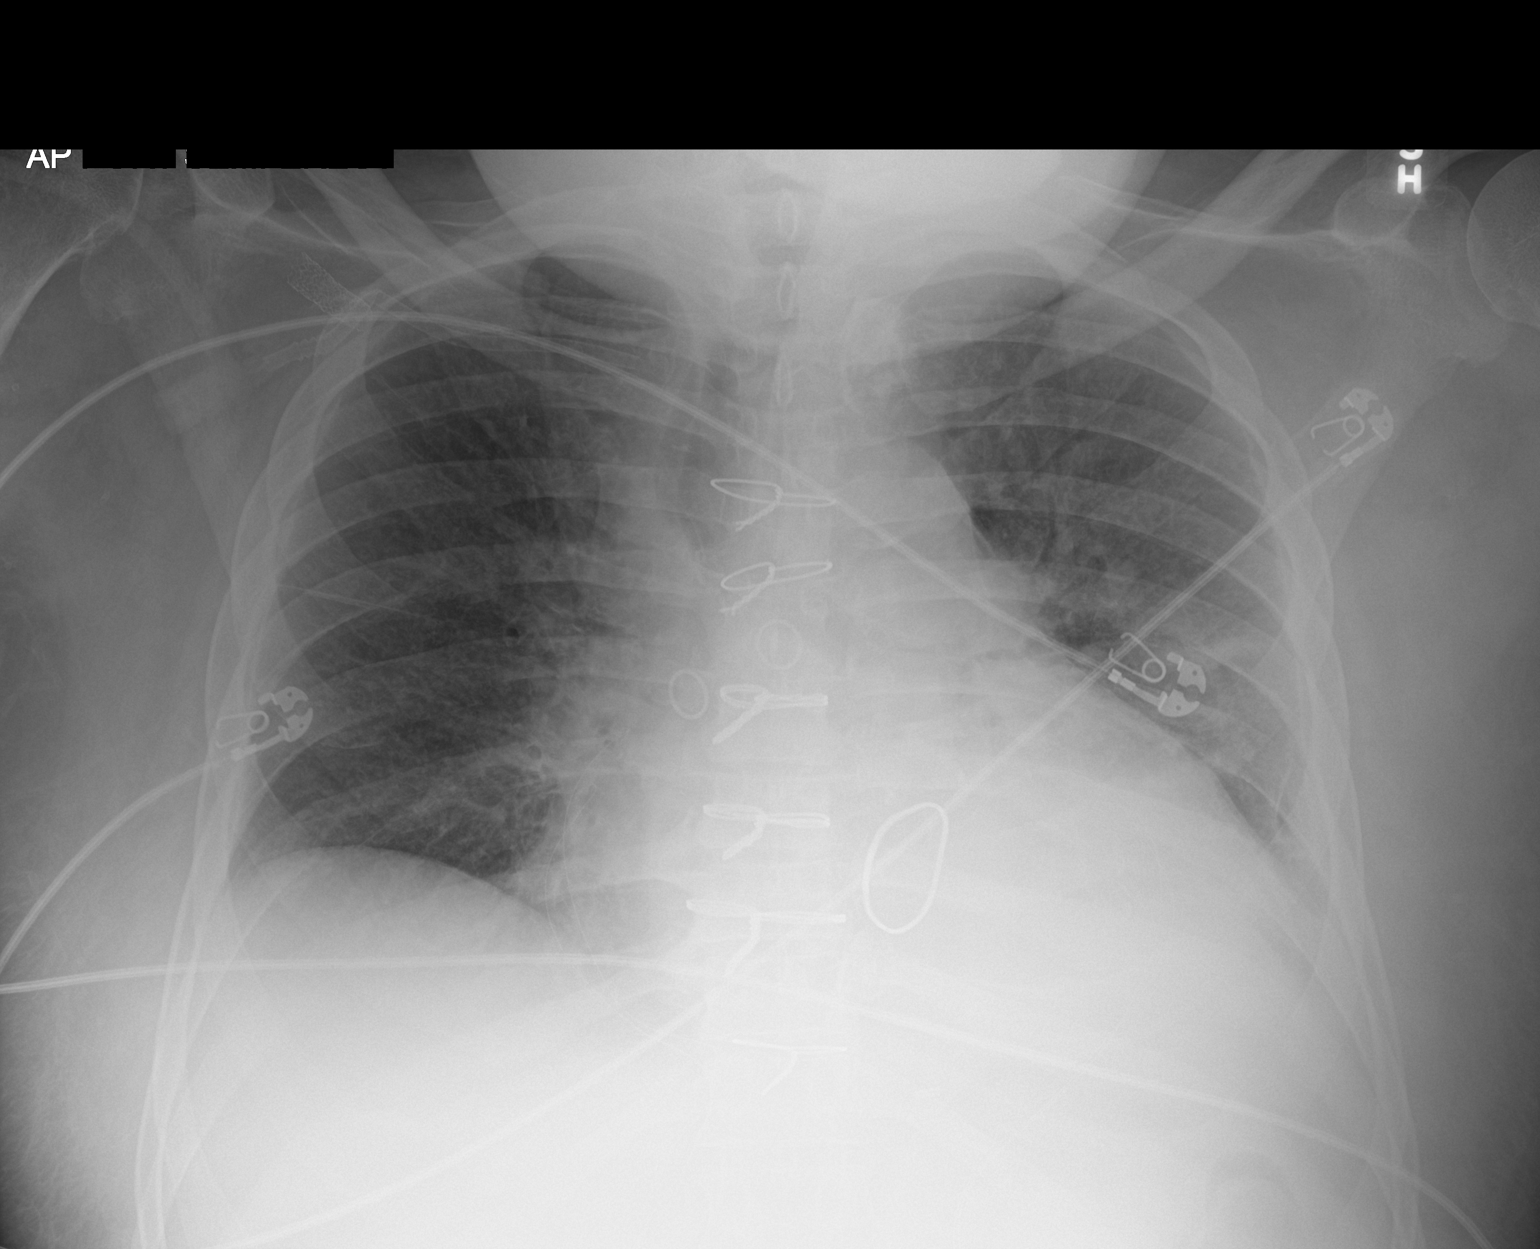

[1 of 1 positions shown; findings below may reference images not displayed]

FINDINGS: Low volume chest with atelectasis and probable pleural fluid on the
left. Vascular congestion is improved from before. Right subclavian
sheath and neighboring vascular stent. No evidence of pneumothorax.
Stable cardiopericardial enlargement. Status post mitral valve
annuloplasty and CABG. Stable vascular pedicle widening.
IMPRESSION: 1. Improved pulmonary edema.
2. Unchanged retrocardiac atelectasis and probable effusion.

## 2018-07-30 DIAGNOSIS — N186 End stage renal disease: Secondary | ICD-10-CM | POA: Diagnosis not present

## 2018-07-30 DIAGNOSIS — D631 Anemia in chronic kidney disease: Secondary | ICD-10-CM | POA: Diagnosis not present

## 2018-07-30 DIAGNOSIS — D509 Iron deficiency anemia, unspecified: Secondary | ICD-10-CM | POA: Diagnosis not present

## 2018-07-30 DIAGNOSIS — E119 Type 2 diabetes mellitus without complications: Secondary | ICD-10-CM | POA: Diagnosis not present

## 2018-07-30 DIAGNOSIS — N2581 Secondary hyperparathyroidism of renal origin: Secondary | ICD-10-CM | POA: Diagnosis not present

## 2018-08-01 DIAGNOSIS — E119 Type 2 diabetes mellitus without complications: Secondary | ICD-10-CM | POA: Diagnosis not present

## 2018-08-01 DIAGNOSIS — D631 Anemia in chronic kidney disease: Secondary | ICD-10-CM | POA: Diagnosis not present

## 2018-08-01 DIAGNOSIS — N186 End stage renal disease: Secondary | ICD-10-CM | POA: Diagnosis not present

## 2018-08-01 DIAGNOSIS — D509 Iron deficiency anemia, unspecified: Secondary | ICD-10-CM | POA: Diagnosis not present

## 2018-08-01 DIAGNOSIS — N2581 Secondary hyperparathyroidism of renal origin: Secondary | ICD-10-CM | POA: Diagnosis not present

## 2018-08-03 IMAGING — DX DG CHEST 1V PORT
1 series · 1 of 1 positions shown · non-contrast
Comparison: 09/23/2017 and earlier.

CLINICAL DATA: 71-year-old male postoperative day 12 status post
CABG. Prior NSTEMI. Shortness of breath.

EXAM:
PORTABLE CHEST 1 VIEW

[chest ap]
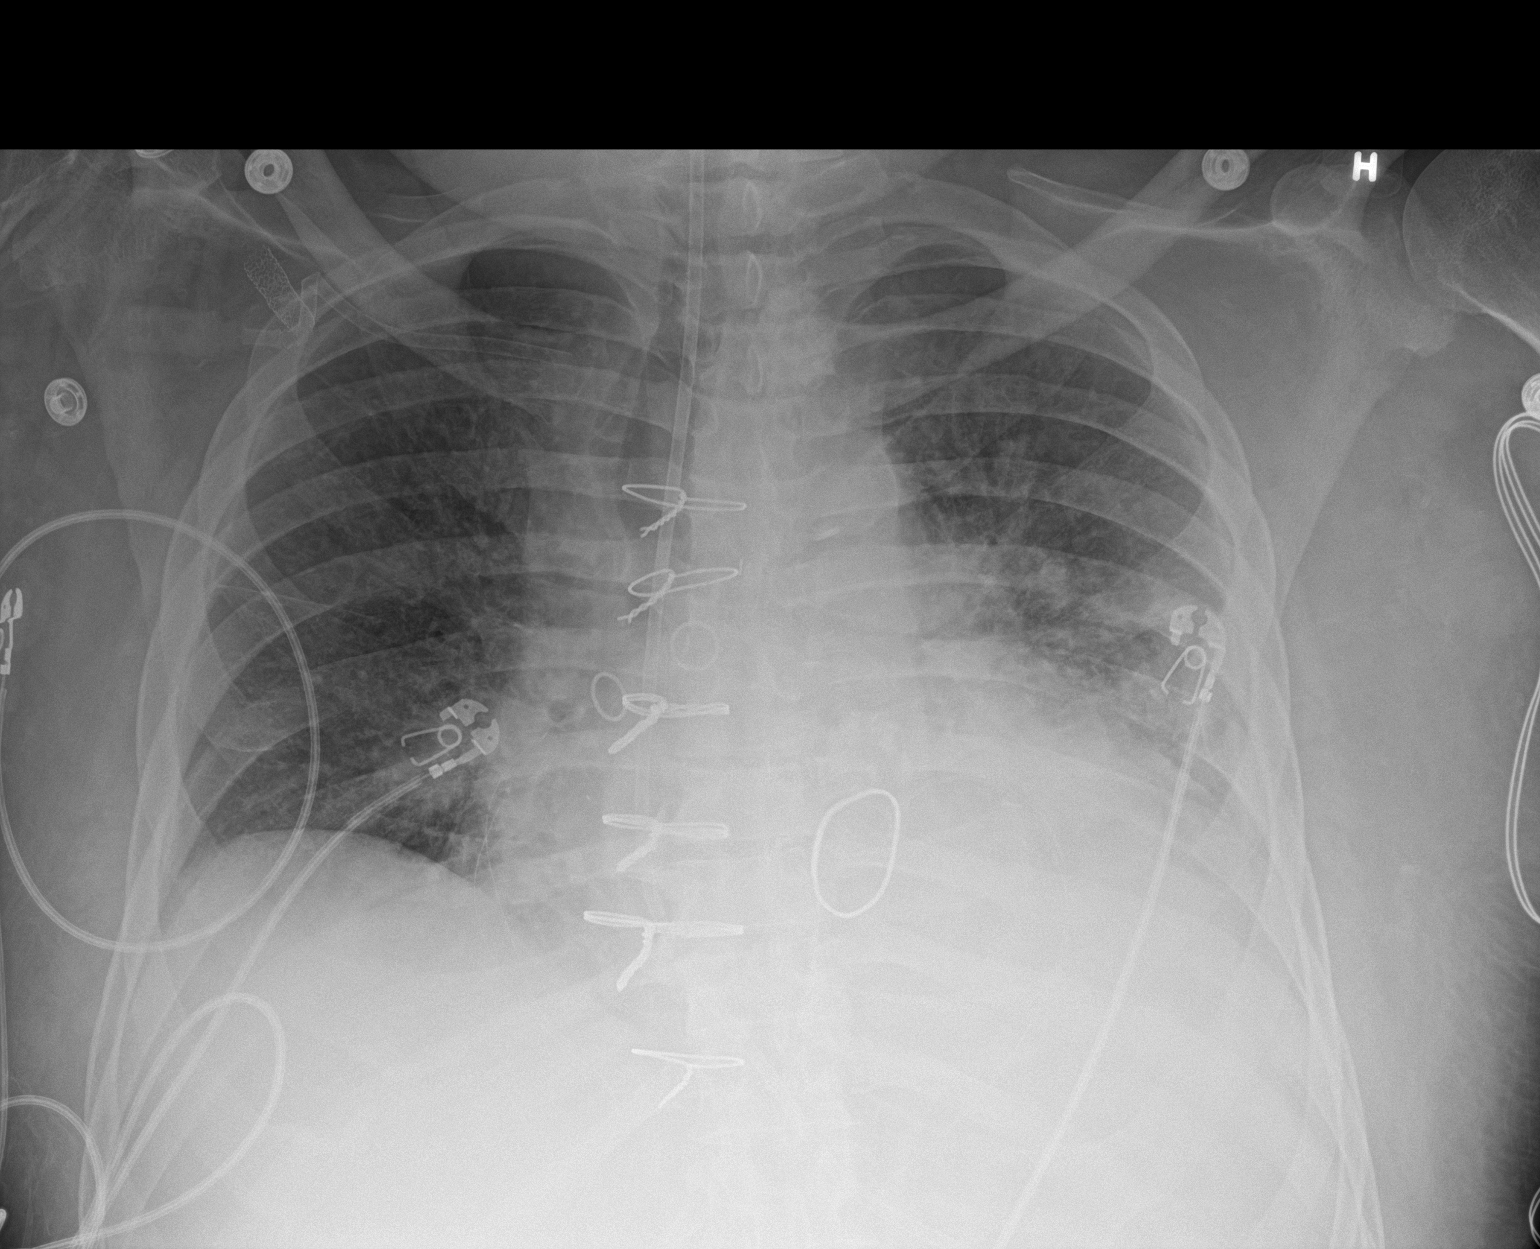

[1 of 1 positions shown; findings below may reference images not displayed]

FINDINGS: Portable AP semi upright view at [id number] hours. Enteric feeding tube
remains in place and courses to the abdomen, tip not included. Right
subclavian introducer sheath remains. Continued confluent left lung
base opacity obscuring the left hemidiaphragm. Stable pulmonary
vascularity. No overt edema. No pneumothorax. Possible left pleural
effusion. Mild streaky infrahilar right lung base opacity is stable.
Stable cardiac size and mediastinal contours. Sequelae of CABG and
cardiac valve replacement.
IMPRESSION: 1. Continued left lower lobe collapse or consolidation. Possible
left pleural effusion.
2. Stable pulmonary vascular congestion without overt edema.
3. Medial right lung base atelectasis.

## 2018-08-04 DIAGNOSIS — E119 Type 2 diabetes mellitus without complications: Secondary | ICD-10-CM | POA: Diagnosis not present

## 2018-08-04 DIAGNOSIS — D509 Iron deficiency anemia, unspecified: Secondary | ICD-10-CM | POA: Diagnosis not present

## 2018-08-04 DIAGNOSIS — N186 End stage renal disease: Secondary | ICD-10-CM | POA: Diagnosis not present

## 2018-08-04 DIAGNOSIS — N2581 Secondary hyperparathyroidism of renal origin: Secondary | ICD-10-CM | POA: Diagnosis not present

## 2018-08-04 DIAGNOSIS — D631 Anemia in chronic kidney disease: Secondary | ICD-10-CM | POA: Diagnosis not present

## 2018-08-06 DIAGNOSIS — E119 Type 2 diabetes mellitus without complications: Secondary | ICD-10-CM | POA: Diagnosis not present

## 2018-08-06 DIAGNOSIS — N2581 Secondary hyperparathyroidism of renal origin: Secondary | ICD-10-CM | POA: Diagnosis not present

## 2018-08-06 DIAGNOSIS — D631 Anemia in chronic kidney disease: Secondary | ICD-10-CM | POA: Diagnosis not present

## 2018-08-06 DIAGNOSIS — E1129 Type 2 diabetes mellitus with other diabetic kidney complication: Secondary | ICD-10-CM | POA: Diagnosis not present

## 2018-08-06 DIAGNOSIS — N186 End stage renal disease: Secondary | ICD-10-CM | POA: Diagnosis not present

## 2018-08-06 DIAGNOSIS — D509 Iron deficiency anemia, unspecified: Secondary | ICD-10-CM | POA: Diagnosis not present

## 2018-08-08 DIAGNOSIS — D631 Anemia in chronic kidney disease: Secondary | ICD-10-CM | POA: Diagnosis not present

## 2018-08-08 DIAGNOSIS — N2581 Secondary hyperparathyroidism of renal origin: Secondary | ICD-10-CM | POA: Diagnosis not present

## 2018-08-08 DIAGNOSIS — D509 Iron deficiency anemia, unspecified: Secondary | ICD-10-CM | POA: Diagnosis not present

## 2018-08-08 DIAGNOSIS — N186 End stage renal disease: Secondary | ICD-10-CM | POA: Diagnosis not present

## 2018-08-08 DIAGNOSIS — E119 Type 2 diabetes mellitus without complications: Secondary | ICD-10-CM | POA: Diagnosis not present

## 2018-08-09 DIAGNOSIS — M791 Myalgia, unspecified site: Secondary | ICD-10-CM | POA: Diagnosis not present

## 2018-08-09 DIAGNOSIS — E118 Type 2 diabetes mellitus with unspecified complications: Secondary | ICD-10-CM | POA: Diagnosis not present

## 2018-08-09 DIAGNOSIS — I6350 Cerebral infarction due to unspecified occlusion or stenosis of unspecified cerebral artery: Secondary | ICD-10-CM | POA: Diagnosis not present

## 2018-08-09 DIAGNOSIS — Z7901 Long term (current) use of anticoagulants: Secondary | ICD-10-CM | POA: Diagnosis not present

## 2018-08-09 DIAGNOSIS — J449 Chronic obstructive pulmonary disease, unspecified: Secondary | ICD-10-CM | POA: Diagnosis not present

## 2018-08-09 DIAGNOSIS — Z992 Dependence on renal dialysis: Secondary | ICD-10-CM | POA: Diagnosis not present

## 2018-08-09 DIAGNOSIS — I120 Hypertensive chronic kidney disease with stage 5 chronic kidney disease or end stage renal disease: Secondary | ICD-10-CM | POA: Diagnosis not present

## 2018-08-11 DIAGNOSIS — D509 Iron deficiency anemia, unspecified: Secondary | ICD-10-CM | POA: Diagnosis not present

## 2018-08-11 DIAGNOSIS — N2581 Secondary hyperparathyroidism of renal origin: Secondary | ICD-10-CM | POA: Diagnosis not present

## 2018-08-11 DIAGNOSIS — E119 Type 2 diabetes mellitus without complications: Secondary | ICD-10-CM | POA: Diagnosis not present

## 2018-08-11 DIAGNOSIS — D631 Anemia in chronic kidney disease: Secondary | ICD-10-CM | POA: Diagnosis not present

## 2018-08-11 DIAGNOSIS — N186 End stage renal disease: Secondary | ICD-10-CM | POA: Diagnosis not present

## 2018-08-13 DIAGNOSIS — D509 Iron deficiency anemia, unspecified: Secondary | ICD-10-CM | POA: Diagnosis not present

## 2018-08-13 DIAGNOSIS — D631 Anemia in chronic kidney disease: Secondary | ICD-10-CM | POA: Diagnosis not present

## 2018-08-13 DIAGNOSIS — N2581 Secondary hyperparathyroidism of renal origin: Secondary | ICD-10-CM | POA: Diagnosis not present

## 2018-08-13 DIAGNOSIS — N186 End stage renal disease: Secondary | ICD-10-CM | POA: Diagnosis not present

## 2018-08-13 DIAGNOSIS — E119 Type 2 diabetes mellitus without complications: Secondary | ICD-10-CM | POA: Diagnosis not present

## 2018-08-15 DIAGNOSIS — Z992 Dependence on renal dialysis: Secondary | ICD-10-CM | POA: Diagnosis not present

## 2018-08-15 DIAGNOSIS — E119 Type 2 diabetes mellitus without complications: Secondary | ICD-10-CM | POA: Diagnosis not present

## 2018-08-15 DIAGNOSIS — D509 Iron deficiency anemia, unspecified: Secondary | ICD-10-CM | POA: Diagnosis not present

## 2018-08-15 DIAGNOSIS — D631 Anemia in chronic kidney disease: Secondary | ICD-10-CM | POA: Diagnosis not present

## 2018-08-15 DIAGNOSIS — R279 Unspecified lack of coordination: Secondary | ICD-10-CM | POA: Diagnosis not present

## 2018-08-15 DIAGNOSIS — N2581 Secondary hyperparathyroidism of renal origin: Secondary | ICD-10-CM | POA: Diagnosis not present

## 2018-08-15 DIAGNOSIS — E1129 Type 2 diabetes mellitus with other diabetic kidney complication: Secondary | ICD-10-CM | POA: Diagnosis not present

## 2018-08-15 DIAGNOSIS — N186 End stage renal disease: Secondary | ICD-10-CM | POA: Diagnosis not present

## 2018-08-15 DIAGNOSIS — Z743 Need for continuous supervision: Secondary | ICD-10-CM | POA: Diagnosis not present

## 2018-08-16 IMAGING — CR DG CHEST 2V
2 series · 2 of 2 positions shown · non-contrast
Comparison: Chest radiograph performed 10/01/2017

CLINICAL DATA: Acute onset of altered mental status.

EXAM:
CHEST  2 VIEW

[chest lat]
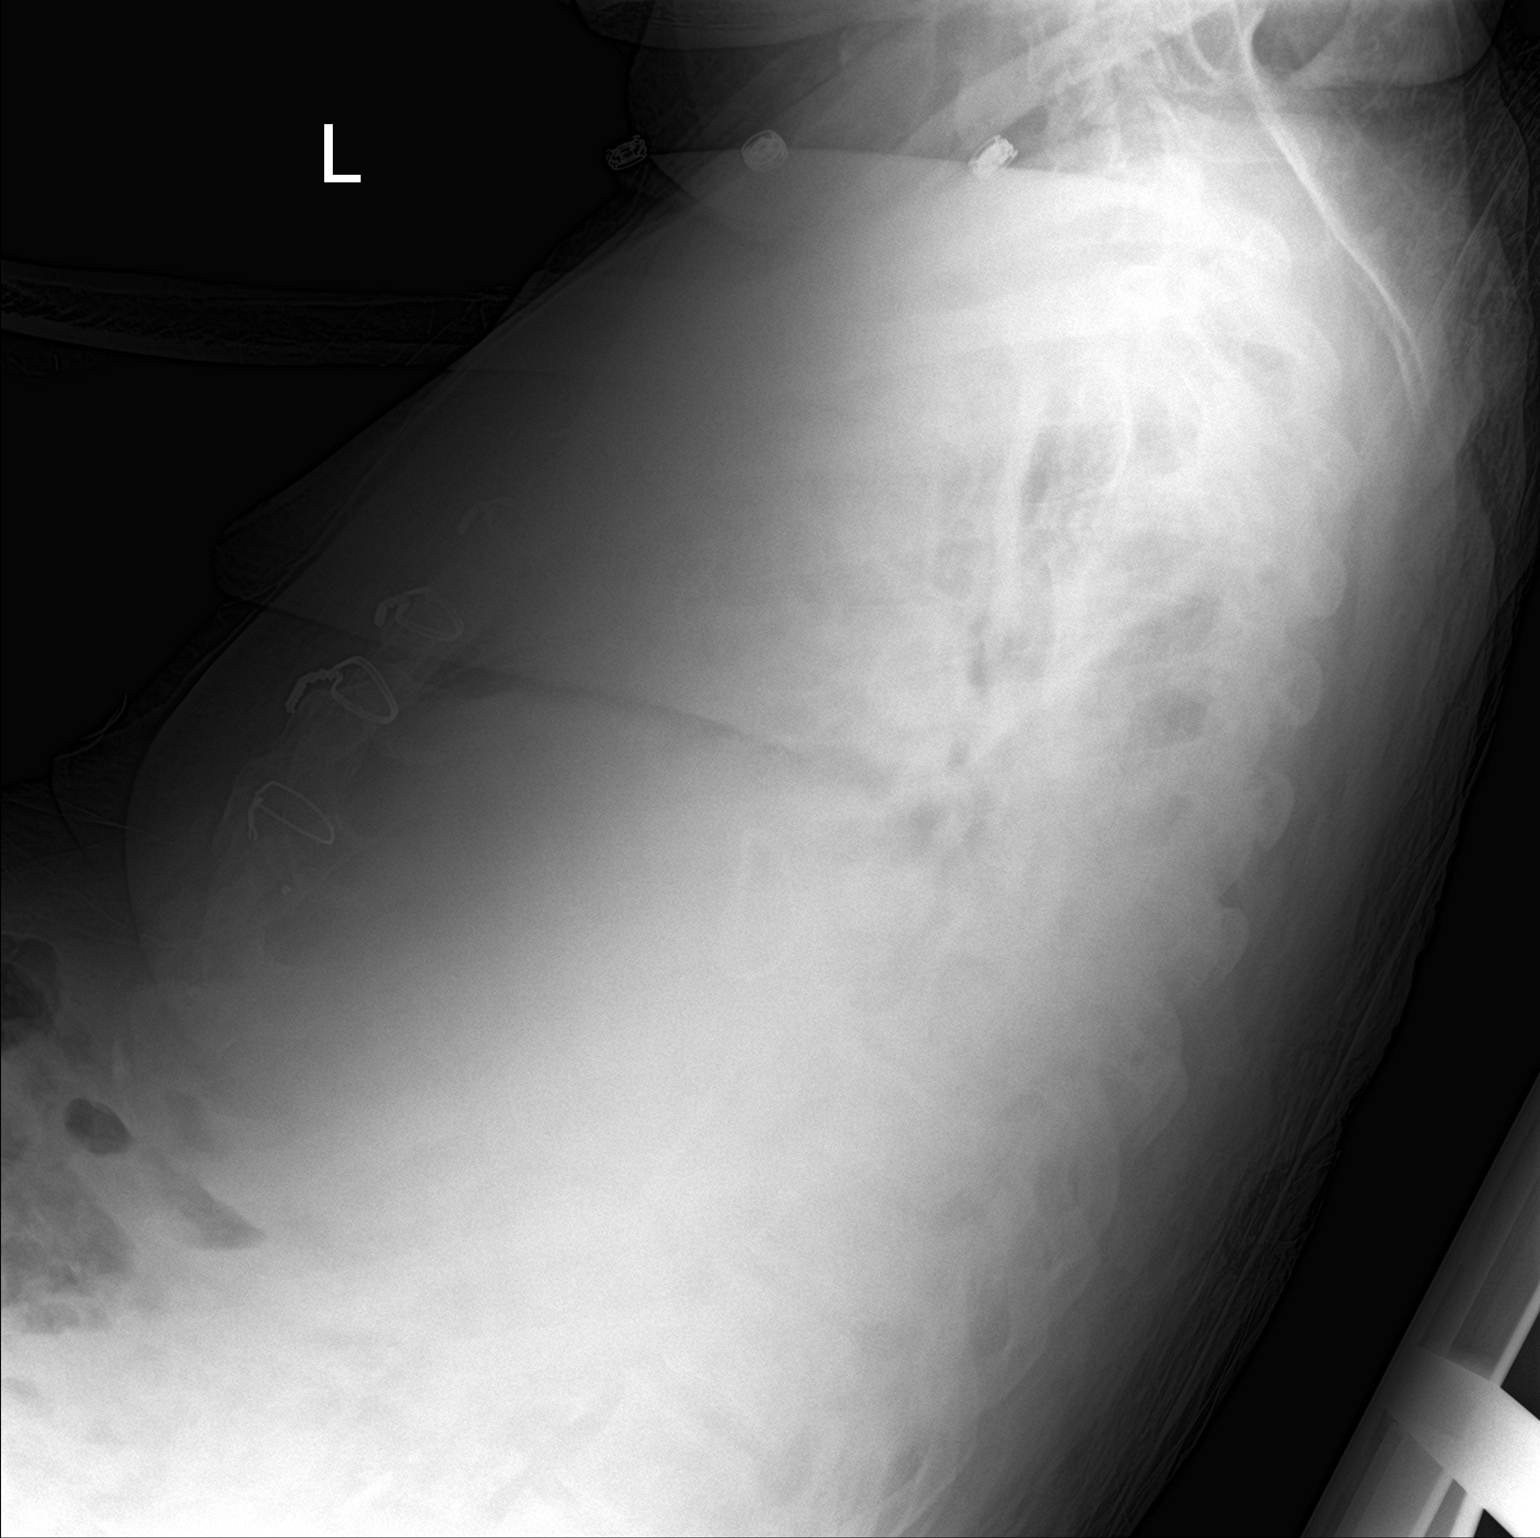

[chest ap]
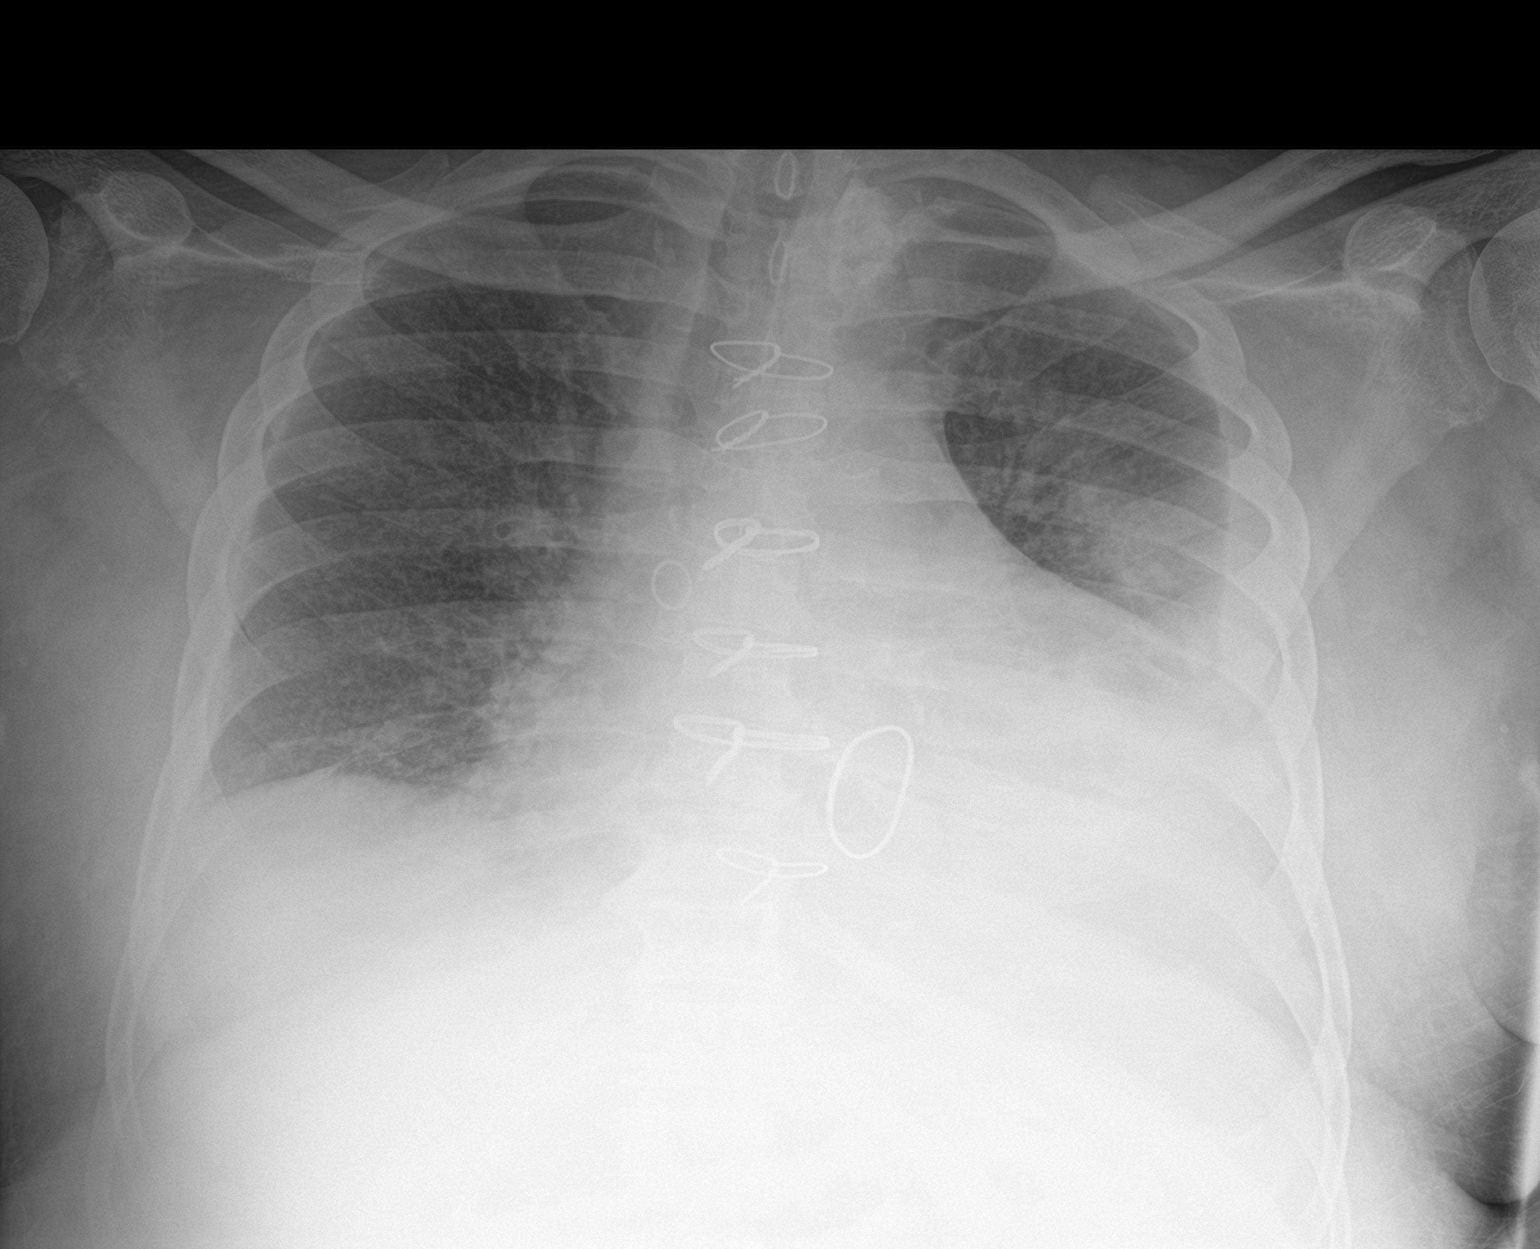

[2 of 2 positions shown; findings below may reference images not displayed]

FINDINGS: There is persistent left basilar and left midlung airspace opacity,
concerning for pneumonia. A small to moderate left-sided pleural
effusion is again noted. The right lung appears relatively clear. No
pneumothorax is seen.

The cardiomediastinal silhouette is mildly enlarged. The patient is
status post median sternotomy. A valve replacement is noted. No
acute osseous abnormalities are seen.
IMPRESSION: 1. Persistent left-sided pneumonia, with small to moderate
left-sided pleural effusion.
2. Mild cardiomegaly.

## 2018-08-18 DIAGNOSIS — D631 Anemia in chronic kidney disease: Secondary | ICD-10-CM | POA: Diagnosis not present

## 2018-08-18 DIAGNOSIS — N186 End stage renal disease: Secondary | ICD-10-CM | POA: Diagnosis not present

## 2018-08-18 DIAGNOSIS — N2581 Secondary hyperparathyroidism of renal origin: Secondary | ICD-10-CM | POA: Diagnosis not present

## 2018-08-18 DIAGNOSIS — D509 Iron deficiency anemia, unspecified: Secondary | ICD-10-CM | POA: Diagnosis not present

## 2018-08-18 DIAGNOSIS — E119 Type 2 diabetes mellitus without complications: Secondary | ICD-10-CM | POA: Diagnosis not present

## 2018-08-19 DIAGNOSIS — I871 Compression of vein: Secondary | ICD-10-CM | POA: Diagnosis not present

## 2018-08-19 DIAGNOSIS — N186 End stage renal disease: Secondary | ICD-10-CM | POA: Diagnosis not present

## 2018-08-19 DIAGNOSIS — Z992 Dependence on renal dialysis: Secondary | ICD-10-CM | POA: Diagnosis not present

## 2018-08-19 DIAGNOSIS — T82858A Stenosis of vascular prosthetic devices, implants and grafts, initial encounter: Secondary | ICD-10-CM | POA: Diagnosis not present

## 2018-08-20 DIAGNOSIS — D631 Anemia in chronic kidney disease: Secondary | ICD-10-CM | POA: Diagnosis not present

## 2018-08-20 DIAGNOSIS — N186 End stage renal disease: Secondary | ICD-10-CM | POA: Diagnosis not present

## 2018-08-20 DIAGNOSIS — E119 Type 2 diabetes mellitus without complications: Secondary | ICD-10-CM | POA: Diagnosis not present

## 2018-08-20 DIAGNOSIS — D509 Iron deficiency anemia, unspecified: Secondary | ICD-10-CM | POA: Diagnosis not present

## 2018-08-20 DIAGNOSIS — N2581 Secondary hyperparathyroidism of renal origin: Secondary | ICD-10-CM | POA: Diagnosis not present

## 2018-08-22 DIAGNOSIS — N186 End stage renal disease: Secondary | ICD-10-CM | POA: Diagnosis not present

## 2018-08-22 DIAGNOSIS — D509 Iron deficiency anemia, unspecified: Secondary | ICD-10-CM | POA: Diagnosis not present

## 2018-08-22 DIAGNOSIS — E119 Type 2 diabetes mellitus without complications: Secondary | ICD-10-CM | POA: Diagnosis not present

## 2018-08-22 DIAGNOSIS — N2581 Secondary hyperparathyroidism of renal origin: Secondary | ICD-10-CM | POA: Diagnosis not present

## 2018-08-22 DIAGNOSIS — D631 Anemia in chronic kidney disease: Secondary | ICD-10-CM | POA: Diagnosis not present

## 2018-08-25 DIAGNOSIS — N2581 Secondary hyperparathyroidism of renal origin: Secondary | ICD-10-CM | POA: Diagnosis not present

## 2018-08-25 DIAGNOSIS — D631 Anemia in chronic kidney disease: Secondary | ICD-10-CM | POA: Diagnosis not present

## 2018-08-25 DIAGNOSIS — D509 Iron deficiency anemia, unspecified: Secondary | ICD-10-CM | POA: Diagnosis not present

## 2018-08-25 DIAGNOSIS — E119 Type 2 diabetes mellitus without complications: Secondary | ICD-10-CM | POA: Diagnosis not present

## 2018-08-25 DIAGNOSIS — N186 End stage renal disease: Secondary | ICD-10-CM | POA: Diagnosis not present

## 2018-08-27 DIAGNOSIS — N186 End stage renal disease: Secondary | ICD-10-CM | POA: Diagnosis not present

## 2018-08-27 DIAGNOSIS — D509 Iron deficiency anemia, unspecified: Secondary | ICD-10-CM | POA: Diagnosis not present

## 2018-08-27 DIAGNOSIS — E119 Type 2 diabetes mellitus without complications: Secondary | ICD-10-CM | POA: Diagnosis not present

## 2018-08-27 DIAGNOSIS — D631 Anemia in chronic kidney disease: Secondary | ICD-10-CM | POA: Diagnosis not present

## 2018-08-27 DIAGNOSIS — N2581 Secondary hyperparathyroidism of renal origin: Secondary | ICD-10-CM | POA: Diagnosis not present

## 2018-08-29 DIAGNOSIS — D631 Anemia in chronic kidney disease: Secondary | ICD-10-CM | POA: Diagnosis not present

## 2018-08-29 DIAGNOSIS — N186 End stage renal disease: Secondary | ICD-10-CM | POA: Diagnosis not present

## 2018-08-29 DIAGNOSIS — N2581 Secondary hyperparathyroidism of renal origin: Secondary | ICD-10-CM | POA: Diagnosis not present

## 2018-08-29 DIAGNOSIS — D509 Iron deficiency anemia, unspecified: Secondary | ICD-10-CM | POA: Diagnosis not present

## 2018-08-29 DIAGNOSIS — E119 Type 2 diabetes mellitus without complications: Secondary | ICD-10-CM | POA: Diagnosis not present

## 2018-09-01 DIAGNOSIS — D631 Anemia in chronic kidney disease: Secondary | ICD-10-CM | POA: Diagnosis not present

## 2018-09-01 DIAGNOSIS — E119 Type 2 diabetes mellitus without complications: Secondary | ICD-10-CM | POA: Diagnosis not present

## 2018-09-01 DIAGNOSIS — D509 Iron deficiency anemia, unspecified: Secondary | ICD-10-CM | POA: Diagnosis not present

## 2018-09-01 DIAGNOSIS — N2581 Secondary hyperparathyroidism of renal origin: Secondary | ICD-10-CM | POA: Diagnosis not present

## 2018-09-01 DIAGNOSIS — N186 End stage renal disease: Secondary | ICD-10-CM | POA: Diagnosis not present

## 2018-09-03 DIAGNOSIS — D509 Iron deficiency anemia, unspecified: Secondary | ICD-10-CM | POA: Diagnosis not present

## 2018-09-03 DIAGNOSIS — E119 Type 2 diabetes mellitus without complications: Secondary | ICD-10-CM | POA: Diagnosis not present

## 2018-09-03 DIAGNOSIS — D631 Anemia in chronic kidney disease: Secondary | ICD-10-CM | POA: Diagnosis not present

## 2018-09-03 DIAGNOSIS — N186 End stage renal disease: Secondary | ICD-10-CM | POA: Diagnosis not present

## 2018-09-03 DIAGNOSIS — N2581 Secondary hyperparathyroidism of renal origin: Secondary | ICD-10-CM | POA: Diagnosis not present

## 2018-09-05 DIAGNOSIS — D631 Anemia in chronic kidney disease: Secondary | ICD-10-CM | POA: Diagnosis not present

## 2018-09-05 DIAGNOSIS — D509 Iron deficiency anemia, unspecified: Secondary | ICD-10-CM | POA: Diagnosis not present

## 2018-09-05 DIAGNOSIS — N2581 Secondary hyperparathyroidism of renal origin: Secondary | ICD-10-CM | POA: Diagnosis not present

## 2018-09-05 DIAGNOSIS — E119 Type 2 diabetes mellitus without complications: Secondary | ICD-10-CM | POA: Diagnosis not present

## 2018-09-05 DIAGNOSIS — N186 End stage renal disease: Secondary | ICD-10-CM | POA: Diagnosis not present

## 2018-09-07 DIAGNOSIS — E119 Type 2 diabetes mellitus without complications: Secondary | ICD-10-CM | POA: Diagnosis not present

## 2018-09-07 DIAGNOSIS — N186 End stage renal disease: Secondary | ICD-10-CM | POA: Diagnosis not present

## 2018-09-07 DIAGNOSIS — N2581 Secondary hyperparathyroidism of renal origin: Secondary | ICD-10-CM | POA: Diagnosis not present

## 2018-09-07 DIAGNOSIS — D631 Anemia in chronic kidney disease: Secondary | ICD-10-CM | POA: Diagnosis not present

## 2018-09-07 DIAGNOSIS — D509 Iron deficiency anemia, unspecified: Secondary | ICD-10-CM | POA: Diagnosis not present

## 2018-09-08 DIAGNOSIS — E1122 Type 2 diabetes mellitus with diabetic chronic kidney disease: Secondary | ICD-10-CM | POA: Diagnosis not present

## 2018-09-09 DIAGNOSIS — D509 Iron deficiency anemia, unspecified: Secondary | ICD-10-CM | POA: Diagnosis not present

## 2018-09-09 DIAGNOSIS — E119 Type 2 diabetes mellitus without complications: Secondary | ICD-10-CM | POA: Diagnosis not present

## 2018-09-09 DIAGNOSIS — N186 End stage renal disease: Secondary | ICD-10-CM | POA: Diagnosis not present

## 2018-09-09 DIAGNOSIS — D631 Anemia in chronic kidney disease: Secondary | ICD-10-CM | POA: Diagnosis not present

## 2018-09-09 DIAGNOSIS — N2581 Secondary hyperparathyroidism of renal origin: Secondary | ICD-10-CM | POA: Diagnosis not present

## 2018-09-12 DIAGNOSIS — D509 Iron deficiency anemia, unspecified: Secondary | ICD-10-CM | POA: Diagnosis not present

## 2018-09-12 DIAGNOSIS — N186 End stage renal disease: Secondary | ICD-10-CM | POA: Diagnosis not present

## 2018-09-12 DIAGNOSIS — E119 Type 2 diabetes mellitus without complications: Secondary | ICD-10-CM | POA: Diagnosis not present

## 2018-09-12 DIAGNOSIS — N2581 Secondary hyperparathyroidism of renal origin: Secondary | ICD-10-CM | POA: Diagnosis not present

## 2018-09-12 DIAGNOSIS — D631 Anemia in chronic kidney disease: Secondary | ICD-10-CM | POA: Diagnosis not present

## 2018-09-13 DIAGNOSIS — G40909 Epilepsy, unspecified, not intractable, without status epilepticus: Secondary | ICD-10-CM | POA: Diagnosis not present

## 2018-09-13 DIAGNOSIS — I120 Hypertensive chronic kidney disease with stage 5 chronic kidney disease or end stage renal disease: Secondary | ICD-10-CM | POA: Diagnosis not present

## 2018-09-13 DIAGNOSIS — E1165 Type 2 diabetes mellitus with hyperglycemia: Secondary | ICD-10-CM | POA: Diagnosis not present

## 2018-09-13 DIAGNOSIS — J449 Chronic obstructive pulmonary disease, unspecified: Secondary | ICD-10-CM | POA: Diagnosis not present

## 2018-09-13 DIAGNOSIS — Z7902 Long term (current) use of antithrombotics/antiplatelets: Secondary | ICD-10-CM | POA: Diagnosis not present

## 2018-09-13 DIAGNOSIS — I509 Heart failure, unspecified: Secondary | ICD-10-CM | POA: Diagnosis not present

## 2018-09-14 DIAGNOSIS — E1129 Type 2 diabetes mellitus with other diabetic kidney complication: Secondary | ICD-10-CM | POA: Diagnosis not present

## 2018-09-14 DIAGNOSIS — Z992 Dependence on renal dialysis: Secondary | ICD-10-CM | POA: Diagnosis not present

## 2018-09-14 DIAGNOSIS — N186 End stage renal disease: Secondary | ICD-10-CM | POA: Diagnosis not present

## 2018-09-15 DIAGNOSIS — N2581 Secondary hyperparathyroidism of renal origin: Secondary | ICD-10-CM | POA: Diagnosis not present

## 2018-09-15 DIAGNOSIS — N186 End stage renal disease: Secondary | ICD-10-CM | POA: Diagnosis not present

## 2018-09-15 DIAGNOSIS — E119 Type 2 diabetes mellitus without complications: Secondary | ICD-10-CM | POA: Diagnosis not present

## 2018-09-15 DIAGNOSIS — D631 Anemia in chronic kidney disease: Secondary | ICD-10-CM | POA: Diagnosis not present

## 2018-09-15 DIAGNOSIS — D509 Iron deficiency anemia, unspecified: Secondary | ICD-10-CM | POA: Diagnosis not present

## 2018-09-17 DIAGNOSIS — E119 Type 2 diabetes mellitus without complications: Secondary | ICD-10-CM | POA: Diagnosis not present

## 2018-09-17 DIAGNOSIS — D631 Anemia in chronic kidney disease: Secondary | ICD-10-CM | POA: Diagnosis not present

## 2018-09-17 DIAGNOSIS — D509 Iron deficiency anemia, unspecified: Secondary | ICD-10-CM | POA: Diagnosis not present

## 2018-09-17 DIAGNOSIS — N2581 Secondary hyperparathyroidism of renal origin: Secondary | ICD-10-CM | POA: Diagnosis not present

## 2018-09-17 DIAGNOSIS — N186 End stage renal disease: Secondary | ICD-10-CM | POA: Diagnosis not present

## 2018-09-19 DIAGNOSIS — N186 End stage renal disease: Secondary | ICD-10-CM | POA: Diagnosis not present

## 2018-09-19 DIAGNOSIS — D631 Anemia in chronic kidney disease: Secondary | ICD-10-CM | POA: Diagnosis not present

## 2018-09-19 DIAGNOSIS — N2581 Secondary hyperparathyroidism of renal origin: Secondary | ICD-10-CM | POA: Diagnosis not present

## 2018-09-19 DIAGNOSIS — D509 Iron deficiency anemia, unspecified: Secondary | ICD-10-CM | POA: Diagnosis not present

## 2018-09-19 DIAGNOSIS — E119 Type 2 diabetes mellitus without complications: Secondary | ICD-10-CM | POA: Diagnosis not present

## 2018-09-22 DIAGNOSIS — N186 End stage renal disease: Secondary | ICD-10-CM | POA: Diagnosis not present

## 2018-09-22 DIAGNOSIS — E119 Type 2 diabetes mellitus without complications: Secondary | ICD-10-CM | POA: Diagnosis not present

## 2018-09-22 DIAGNOSIS — N2581 Secondary hyperparathyroidism of renal origin: Secondary | ICD-10-CM | POA: Diagnosis not present

## 2018-09-22 DIAGNOSIS — D509 Iron deficiency anemia, unspecified: Secondary | ICD-10-CM | POA: Diagnosis not present

## 2018-09-22 DIAGNOSIS — D631 Anemia in chronic kidney disease: Secondary | ICD-10-CM | POA: Diagnosis not present

## 2018-09-24 DIAGNOSIS — E119 Type 2 diabetes mellitus without complications: Secondary | ICD-10-CM | POA: Diagnosis not present

## 2018-09-24 DIAGNOSIS — D631 Anemia in chronic kidney disease: Secondary | ICD-10-CM | POA: Diagnosis not present

## 2018-09-24 DIAGNOSIS — N186 End stage renal disease: Secondary | ICD-10-CM | POA: Diagnosis not present

## 2018-09-24 DIAGNOSIS — N2581 Secondary hyperparathyroidism of renal origin: Secondary | ICD-10-CM | POA: Diagnosis not present

## 2018-09-24 DIAGNOSIS — D509 Iron deficiency anemia, unspecified: Secondary | ICD-10-CM | POA: Diagnosis not present

## 2018-09-26 DIAGNOSIS — E119 Type 2 diabetes mellitus without complications: Secondary | ICD-10-CM | POA: Diagnosis not present

## 2018-09-26 DIAGNOSIS — N2581 Secondary hyperparathyroidism of renal origin: Secondary | ICD-10-CM | POA: Diagnosis not present

## 2018-09-26 DIAGNOSIS — D631 Anemia in chronic kidney disease: Secondary | ICD-10-CM | POA: Diagnosis not present

## 2018-09-26 DIAGNOSIS — N186 End stage renal disease: Secondary | ICD-10-CM | POA: Diagnosis not present

## 2018-09-26 DIAGNOSIS — D509 Iron deficiency anemia, unspecified: Secondary | ICD-10-CM | POA: Diagnosis not present

## 2018-09-29 DIAGNOSIS — D631 Anemia in chronic kidney disease: Secondary | ICD-10-CM | POA: Diagnosis not present

## 2018-09-29 DIAGNOSIS — E119 Type 2 diabetes mellitus without complications: Secondary | ICD-10-CM | POA: Diagnosis not present

## 2018-09-29 DIAGNOSIS — N2581 Secondary hyperparathyroidism of renal origin: Secondary | ICD-10-CM | POA: Diagnosis not present

## 2018-09-29 DIAGNOSIS — D509 Iron deficiency anemia, unspecified: Secondary | ICD-10-CM | POA: Diagnosis not present

## 2018-09-29 DIAGNOSIS — N186 End stage renal disease: Secondary | ICD-10-CM | POA: Diagnosis not present

## 2018-10-01 DIAGNOSIS — N2581 Secondary hyperparathyroidism of renal origin: Secondary | ICD-10-CM | POA: Diagnosis not present

## 2018-10-01 DIAGNOSIS — D631 Anemia in chronic kidney disease: Secondary | ICD-10-CM | POA: Diagnosis not present

## 2018-10-01 DIAGNOSIS — E119 Type 2 diabetes mellitus without complications: Secondary | ICD-10-CM | POA: Diagnosis not present

## 2018-10-01 DIAGNOSIS — D509 Iron deficiency anemia, unspecified: Secondary | ICD-10-CM | POA: Diagnosis not present

## 2018-10-01 DIAGNOSIS — N186 End stage renal disease: Secondary | ICD-10-CM | POA: Diagnosis not present

## 2018-10-03 DIAGNOSIS — D631 Anemia in chronic kidney disease: Secondary | ICD-10-CM | POA: Diagnosis not present

## 2018-10-03 DIAGNOSIS — E119 Type 2 diabetes mellitus without complications: Secondary | ICD-10-CM | POA: Diagnosis not present

## 2018-10-03 DIAGNOSIS — D509 Iron deficiency anemia, unspecified: Secondary | ICD-10-CM | POA: Diagnosis not present

## 2018-10-03 DIAGNOSIS — N186 End stage renal disease: Secondary | ICD-10-CM | POA: Diagnosis not present

## 2018-10-03 DIAGNOSIS — N2581 Secondary hyperparathyroidism of renal origin: Secondary | ICD-10-CM | POA: Diagnosis not present

## 2018-10-06 DIAGNOSIS — E119 Type 2 diabetes mellitus without complications: Secondary | ICD-10-CM | POA: Diagnosis not present

## 2018-10-06 DIAGNOSIS — N2581 Secondary hyperparathyroidism of renal origin: Secondary | ICD-10-CM | POA: Diagnosis not present

## 2018-10-06 DIAGNOSIS — D509 Iron deficiency anemia, unspecified: Secondary | ICD-10-CM | POA: Diagnosis not present

## 2018-10-06 DIAGNOSIS — D631 Anemia in chronic kidney disease: Secondary | ICD-10-CM | POA: Diagnosis not present

## 2018-10-06 DIAGNOSIS — N186 End stage renal disease: Secondary | ICD-10-CM | POA: Diagnosis not present

## 2018-10-07 DIAGNOSIS — N186 End stage renal disease: Secondary | ICD-10-CM | POA: Diagnosis not present

## 2018-10-07 DIAGNOSIS — E119 Type 2 diabetes mellitus without complications: Secondary | ICD-10-CM | POA: Diagnosis not present

## 2018-10-07 DIAGNOSIS — N2581 Secondary hyperparathyroidism of renal origin: Secondary | ICD-10-CM | POA: Diagnosis not present

## 2018-10-07 DIAGNOSIS — D631 Anemia in chronic kidney disease: Secondary | ICD-10-CM | POA: Diagnosis not present

## 2018-10-07 DIAGNOSIS — D509 Iron deficiency anemia, unspecified: Secondary | ICD-10-CM | POA: Diagnosis not present

## 2018-10-10 DIAGNOSIS — D509 Iron deficiency anemia, unspecified: Secondary | ICD-10-CM | POA: Diagnosis not present

## 2018-10-10 DIAGNOSIS — E119 Type 2 diabetes mellitus without complications: Secondary | ICD-10-CM | POA: Diagnosis not present

## 2018-10-10 DIAGNOSIS — N186 End stage renal disease: Secondary | ICD-10-CM | POA: Diagnosis not present

## 2018-10-10 DIAGNOSIS — D631 Anemia in chronic kidney disease: Secondary | ICD-10-CM | POA: Diagnosis not present

## 2018-10-10 DIAGNOSIS — N2581 Secondary hyperparathyroidism of renal origin: Secondary | ICD-10-CM | POA: Diagnosis not present

## 2018-10-12 DIAGNOSIS — N2581 Secondary hyperparathyroidism of renal origin: Secondary | ICD-10-CM | POA: Diagnosis not present

## 2018-10-12 DIAGNOSIS — D509 Iron deficiency anemia, unspecified: Secondary | ICD-10-CM | POA: Diagnosis not present

## 2018-10-12 DIAGNOSIS — D631 Anemia in chronic kidney disease: Secondary | ICD-10-CM | POA: Diagnosis not present

## 2018-10-12 DIAGNOSIS — N186 End stage renal disease: Secondary | ICD-10-CM | POA: Diagnosis not present

## 2018-10-12 DIAGNOSIS — E119 Type 2 diabetes mellitus without complications: Secondary | ICD-10-CM | POA: Diagnosis not present

## 2018-10-14 DIAGNOSIS — N2581 Secondary hyperparathyroidism of renal origin: Secondary | ICD-10-CM | POA: Diagnosis not present

## 2018-10-14 DIAGNOSIS — N186 End stage renal disease: Secondary | ICD-10-CM | POA: Diagnosis not present

## 2018-10-14 DIAGNOSIS — E119 Type 2 diabetes mellitus without complications: Secondary | ICD-10-CM | POA: Diagnosis not present

## 2018-10-14 DIAGNOSIS — D509 Iron deficiency anemia, unspecified: Secondary | ICD-10-CM | POA: Diagnosis not present

## 2018-10-14 DIAGNOSIS — D631 Anemia in chronic kidney disease: Secondary | ICD-10-CM | POA: Diagnosis not present

## 2018-10-15 DIAGNOSIS — N186 End stage renal disease: Secondary | ICD-10-CM | POA: Diagnosis not present

## 2018-10-15 DIAGNOSIS — Z992 Dependence on renal dialysis: Secondary | ICD-10-CM | POA: Diagnosis not present

## 2018-10-15 DIAGNOSIS — E1129 Type 2 diabetes mellitus with other diabetic kidney complication: Secondary | ICD-10-CM | POA: Diagnosis not present

## 2018-10-17 DIAGNOSIS — D631 Anemia in chronic kidney disease: Secondary | ICD-10-CM | POA: Diagnosis not present

## 2018-10-17 DIAGNOSIS — E119 Type 2 diabetes mellitus without complications: Secondary | ICD-10-CM | POA: Diagnosis not present

## 2018-10-17 DIAGNOSIS — D509 Iron deficiency anemia, unspecified: Secondary | ICD-10-CM | POA: Diagnosis not present

## 2018-10-17 DIAGNOSIS — N186 End stage renal disease: Secondary | ICD-10-CM | POA: Diagnosis not present

## 2018-10-17 DIAGNOSIS — N2581 Secondary hyperparathyroidism of renal origin: Secondary | ICD-10-CM | POA: Diagnosis not present

## 2018-10-20 DIAGNOSIS — N186 End stage renal disease: Secondary | ICD-10-CM | POA: Diagnosis not present

## 2018-10-20 DIAGNOSIS — N2581 Secondary hyperparathyroidism of renal origin: Secondary | ICD-10-CM | POA: Diagnosis not present

## 2018-10-20 DIAGNOSIS — E119 Type 2 diabetes mellitus without complications: Secondary | ICD-10-CM | POA: Diagnosis not present

## 2018-10-20 DIAGNOSIS — D509 Iron deficiency anemia, unspecified: Secondary | ICD-10-CM | POA: Diagnosis not present

## 2018-10-20 DIAGNOSIS — D631 Anemia in chronic kidney disease: Secondary | ICD-10-CM | POA: Diagnosis not present

## 2018-10-22 DIAGNOSIS — E119 Type 2 diabetes mellitus without complications: Secondary | ICD-10-CM | POA: Diagnosis not present

## 2018-10-22 DIAGNOSIS — D509 Iron deficiency anemia, unspecified: Secondary | ICD-10-CM | POA: Diagnosis not present

## 2018-10-22 DIAGNOSIS — D631 Anemia in chronic kidney disease: Secondary | ICD-10-CM | POA: Diagnosis not present

## 2018-10-22 DIAGNOSIS — N186 End stage renal disease: Secondary | ICD-10-CM | POA: Diagnosis not present

## 2018-10-22 DIAGNOSIS — N2581 Secondary hyperparathyroidism of renal origin: Secondary | ICD-10-CM | POA: Diagnosis not present

## 2018-10-24 DIAGNOSIS — N2581 Secondary hyperparathyroidism of renal origin: Secondary | ICD-10-CM | POA: Diagnosis not present

## 2018-10-24 DIAGNOSIS — E119 Type 2 diabetes mellitus without complications: Secondary | ICD-10-CM | POA: Diagnosis not present

## 2018-10-24 DIAGNOSIS — D631 Anemia in chronic kidney disease: Secondary | ICD-10-CM | POA: Diagnosis not present

## 2018-10-24 DIAGNOSIS — N186 End stage renal disease: Secondary | ICD-10-CM | POA: Diagnosis not present

## 2018-10-24 DIAGNOSIS — D509 Iron deficiency anemia, unspecified: Secondary | ICD-10-CM | POA: Diagnosis not present

## 2018-10-27 DIAGNOSIS — D509 Iron deficiency anemia, unspecified: Secondary | ICD-10-CM | POA: Diagnosis not present

## 2018-10-27 DIAGNOSIS — N186 End stage renal disease: Secondary | ICD-10-CM | POA: Diagnosis not present

## 2018-10-27 DIAGNOSIS — D631 Anemia in chronic kidney disease: Secondary | ICD-10-CM | POA: Diagnosis not present

## 2018-10-27 DIAGNOSIS — E119 Type 2 diabetes mellitus without complications: Secondary | ICD-10-CM | POA: Diagnosis not present

## 2018-10-27 DIAGNOSIS — N2581 Secondary hyperparathyroidism of renal origin: Secondary | ICD-10-CM | POA: Diagnosis not present

## 2018-10-28 DIAGNOSIS — Z992 Dependence on renal dialysis: Secondary | ICD-10-CM | POA: Diagnosis not present

## 2018-10-28 DIAGNOSIS — N186 End stage renal disease: Secondary | ICD-10-CM | POA: Diagnosis not present

## 2018-10-28 DIAGNOSIS — T82858A Stenosis of vascular prosthetic devices, implants and grafts, initial encounter: Secondary | ICD-10-CM | POA: Diagnosis not present

## 2018-10-28 DIAGNOSIS — I871 Compression of vein: Secondary | ICD-10-CM | POA: Diagnosis not present

## 2018-10-29 DIAGNOSIS — N186 End stage renal disease: Secondary | ICD-10-CM | POA: Diagnosis not present

## 2018-10-29 DIAGNOSIS — E119 Type 2 diabetes mellitus without complications: Secondary | ICD-10-CM | POA: Diagnosis not present

## 2018-10-29 DIAGNOSIS — N2581 Secondary hyperparathyroidism of renal origin: Secondary | ICD-10-CM | POA: Diagnosis not present

## 2018-10-29 DIAGNOSIS — D631 Anemia in chronic kidney disease: Secondary | ICD-10-CM | POA: Diagnosis not present

## 2018-10-29 DIAGNOSIS — D509 Iron deficiency anemia, unspecified: Secondary | ICD-10-CM | POA: Diagnosis not present

## 2018-10-31 DIAGNOSIS — E119 Type 2 diabetes mellitus without complications: Secondary | ICD-10-CM | POA: Diagnosis not present

## 2018-10-31 DIAGNOSIS — D631 Anemia in chronic kidney disease: Secondary | ICD-10-CM | POA: Diagnosis not present

## 2018-10-31 DIAGNOSIS — D509 Iron deficiency anemia, unspecified: Secondary | ICD-10-CM | POA: Diagnosis not present

## 2018-10-31 DIAGNOSIS — N186 End stage renal disease: Secondary | ICD-10-CM | POA: Diagnosis not present

## 2018-10-31 DIAGNOSIS — N2581 Secondary hyperparathyroidism of renal origin: Secondary | ICD-10-CM | POA: Diagnosis not present

## 2018-11-03 DIAGNOSIS — N186 End stage renal disease: Secondary | ICD-10-CM | POA: Diagnosis not present

## 2018-11-03 DIAGNOSIS — D631 Anemia in chronic kidney disease: Secondary | ICD-10-CM | POA: Diagnosis not present

## 2018-11-03 DIAGNOSIS — E119 Type 2 diabetes mellitus without complications: Secondary | ICD-10-CM | POA: Diagnosis not present

## 2018-11-03 DIAGNOSIS — D509 Iron deficiency anemia, unspecified: Secondary | ICD-10-CM | POA: Diagnosis not present

## 2018-11-03 DIAGNOSIS — N2581 Secondary hyperparathyroidism of renal origin: Secondary | ICD-10-CM | POA: Diagnosis not present

## 2018-11-04 ENCOUNTER — Telehealth: Payer: Self-pay | Admitting: *Deleted

## 2018-11-04 NOTE — Telephone Encounter (Signed)
REFERRAL SENT TO SCHEDULING AND NOTES ON FILE.. °

## 2018-11-05 DIAGNOSIS — E1129 Type 2 diabetes mellitus with other diabetic kidney complication: Secondary | ICD-10-CM | POA: Diagnosis not present

## 2018-11-05 DIAGNOSIS — D631 Anemia in chronic kidney disease: Secondary | ICD-10-CM | POA: Diagnosis not present

## 2018-11-05 DIAGNOSIS — N2581 Secondary hyperparathyroidism of renal origin: Secondary | ICD-10-CM | POA: Diagnosis not present

## 2018-11-05 DIAGNOSIS — D509 Iron deficiency anemia, unspecified: Secondary | ICD-10-CM | POA: Diagnosis not present

## 2018-11-05 DIAGNOSIS — N186 End stage renal disease: Secondary | ICD-10-CM | POA: Diagnosis not present

## 2018-11-05 DIAGNOSIS — E119 Type 2 diabetes mellitus without complications: Secondary | ICD-10-CM | POA: Diagnosis not present

## 2018-11-07 DIAGNOSIS — E119 Type 2 diabetes mellitus without complications: Secondary | ICD-10-CM | POA: Diagnosis not present

## 2018-11-07 DIAGNOSIS — D509 Iron deficiency anemia, unspecified: Secondary | ICD-10-CM | POA: Diagnosis not present

## 2018-11-07 DIAGNOSIS — N2581 Secondary hyperparathyroidism of renal origin: Secondary | ICD-10-CM | POA: Diagnosis not present

## 2018-11-07 DIAGNOSIS — D631 Anemia in chronic kidney disease: Secondary | ICD-10-CM | POA: Diagnosis not present

## 2018-11-07 DIAGNOSIS — N186 End stage renal disease: Secondary | ICD-10-CM | POA: Diagnosis not present

## 2018-11-10 DIAGNOSIS — D509 Iron deficiency anemia, unspecified: Secondary | ICD-10-CM | POA: Diagnosis not present

## 2018-11-10 DIAGNOSIS — N2581 Secondary hyperparathyroidism of renal origin: Secondary | ICD-10-CM | POA: Diagnosis not present

## 2018-11-10 DIAGNOSIS — D631 Anemia in chronic kidney disease: Secondary | ICD-10-CM | POA: Diagnosis not present

## 2018-11-10 DIAGNOSIS — N186 End stage renal disease: Secondary | ICD-10-CM | POA: Diagnosis not present

## 2018-11-10 DIAGNOSIS — E119 Type 2 diabetes mellitus without complications: Secondary | ICD-10-CM | POA: Diagnosis not present

## 2018-11-12 DIAGNOSIS — E119 Type 2 diabetes mellitus without complications: Secondary | ICD-10-CM | POA: Diagnosis not present

## 2018-11-12 DIAGNOSIS — D509 Iron deficiency anemia, unspecified: Secondary | ICD-10-CM | POA: Diagnosis not present

## 2018-11-12 DIAGNOSIS — D631 Anemia in chronic kidney disease: Secondary | ICD-10-CM | POA: Diagnosis not present

## 2018-11-12 DIAGNOSIS — N186 End stage renal disease: Secondary | ICD-10-CM | POA: Diagnosis not present

## 2018-11-12 DIAGNOSIS — N2581 Secondary hyperparathyroidism of renal origin: Secondary | ICD-10-CM | POA: Diagnosis not present

## 2018-11-14 DIAGNOSIS — N186 End stage renal disease: Secondary | ICD-10-CM | POA: Diagnosis not present

## 2018-11-14 DIAGNOSIS — N2581 Secondary hyperparathyroidism of renal origin: Secondary | ICD-10-CM | POA: Diagnosis not present

## 2018-11-14 DIAGNOSIS — E119 Type 2 diabetes mellitus without complications: Secondary | ICD-10-CM | POA: Diagnosis not present

## 2018-11-14 DIAGNOSIS — D509 Iron deficiency anemia, unspecified: Secondary | ICD-10-CM | POA: Diagnosis not present

## 2018-11-14 DIAGNOSIS — D631 Anemia in chronic kidney disease: Secondary | ICD-10-CM | POA: Diagnosis not present

## 2018-11-15 DIAGNOSIS — N186 End stage renal disease: Secondary | ICD-10-CM | POA: Diagnosis not present

## 2018-11-15 DIAGNOSIS — E1129 Type 2 diabetes mellitus with other diabetic kidney complication: Secondary | ICD-10-CM | POA: Diagnosis not present

## 2018-11-15 DIAGNOSIS — Z992 Dependence on renal dialysis: Secondary | ICD-10-CM | POA: Diagnosis not present

## 2018-11-17 DIAGNOSIS — D509 Iron deficiency anemia, unspecified: Secondary | ICD-10-CM | POA: Diagnosis not present

## 2018-11-17 DIAGNOSIS — D631 Anemia in chronic kidney disease: Secondary | ICD-10-CM | POA: Diagnosis not present

## 2018-11-17 DIAGNOSIS — N2581 Secondary hyperparathyroidism of renal origin: Secondary | ICD-10-CM | POA: Diagnosis not present

## 2018-11-17 DIAGNOSIS — E119 Type 2 diabetes mellitus without complications: Secondary | ICD-10-CM | POA: Diagnosis not present

## 2018-11-17 DIAGNOSIS — N186 End stage renal disease: Secondary | ICD-10-CM | POA: Diagnosis not present

## 2018-11-19 DIAGNOSIS — N186 End stage renal disease: Secondary | ICD-10-CM | POA: Diagnosis not present

## 2018-11-19 DIAGNOSIS — E119 Type 2 diabetes mellitus without complications: Secondary | ICD-10-CM | POA: Diagnosis not present

## 2018-11-19 DIAGNOSIS — D631 Anemia in chronic kidney disease: Secondary | ICD-10-CM | POA: Diagnosis not present

## 2018-11-19 DIAGNOSIS — N2581 Secondary hyperparathyroidism of renal origin: Secondary | ICD-10-CM | POA: Diagnosis not present

## 2018-11-19 DIAGNOSIS — D509 Iron deficiency anemia, unspecified: Secondary | ICD-10-CM | POA: Diagnosis not present

## 2018-11-22 DIAGNOSIS — D509 Iron deficiency anemia, unspecified: Secondary | ICD-10-CM | POA: Diagnosis not present

## 2018-11-22 DIAGNOSIS — D631 Anemia in chronic kidney disease: Secondary | ICD-10-CM | POA: Diagnosis not present

## 2018-11-22 DIAGNOSIS — N2581 Secondary hyperparathyroidism of renal origin: Secondary | ICD-10-CM | POA: Diagnosis not present

## 2018-11-22 DIAGNOSIS — E119 Type 2 diabetes mellitus without complications: Secondary | ICD-10-CM | POA: Diagnosis not present

## 2018-11-22 DIAGNOSIS — N186 End stage renal disease: Secondary | ICD-10-CM | POA: Diagnosis not present

## 2018-11-24 DIAGNOSIS — N2581 Secondary hyperparathyroidism of renal origin: Secondary | ICD-10-CM | POA: Diagnosis not present

## 2018-11-24 DIAGNOSIS — D509 Iron deficiency anemia, unspecified: Secondary | ICD-10-CM | POA: Diagnosis not present

## 2018-11-24 DIAGNOSIS — D631 Anemia in chronic kidney disease: Secondary | ICD-10-CM | POA: Diagnosis not present

## 2018-11-24 DIAGNOSIS — E119 Type 2 diabetes mellitus without complications: Secondary | ICD-10-CM | POA: Diagnosis not present

## 2018-11-24 DIAGNOSIS — N186 End stage renal disease: Secondary | ICD-10-CM | POA: Diagnosis not present

## 2018-11-26 DIAGNOSIS — E119 Type 2 diabetes mellitus without complications: Secondary | ICD-10-CM | POA: Diagnosis not present

## 2018-11-26 DIAGNOSIS — D631 Anemia in chronic kidney disease: Secondary | ICD-10-CM | POA: Diagnosis not present

## 2018-11-26 DIAGNOSIS — N2581 Secondary hyperparathyroidism of renal origin: Secondary | ICD-10-CM | POA: Diagnosis not present

## 2018-11-26 DIAGNOSIS — D509 Iron deficiency anemia, unspecified: Secondary | ICD-10-CM | POA: Diagnosis not present

## 2018-11-26 DIAGNOSIS — N186 End stage renal disease: Secondary | ICD-10-CM | POA: Diagnosis not present

## 2018-11-28 DIAGNOSIS — D631 Anemia in chronic kidney disease: Secondary | ICD-10-CM | POA: Diagnosis not present

## 2018-11-28 DIAGNOSIS — N2581 Secondary hyperparathyroidism of renal origin: Secondary | ICD-10-CM | POA: Diagnosis not present

## 2018-11-28 DIAGNOSIS — D509 Iron deficiency anemia, unspecified: Secondary | ICD-10-CM | POA: Diagnosis not present

## 2018-11-28 DIAGNOSIS — E119 Type 2 diabetes mellitus without complications: Secondary | ICD-10-CM | POA: Diagnosis not present

## 2018-11-28 DIAGNOSIS — N186 End stage renal disease: Secondary | ICD-10-CM | POA: Diagnosis not present

## 2018-12-01 DIAGNOSIS — D631 Anemia in chronic kidney disease: Secondary | ICD-10-CM | POA: Diagnosis not present

## 2018-12-01 DIAGNOSIS — N186 End stage renal disease: Secondary | ICD-10-CM | POA: Diagnosis not present

## 2018-12-01 DIAGNOSIS — N2581 Secondary hyperparathyroidism of renal origin: Secondary | ICD-10-CM | POA: Diagnosis not present

## 2018-12-01 DIAGNOSIS — D509 Iron deficiency anemia, unspecified: Secondary | ICD-10-CM | POA: Diagnosis not present

## 2018-12-01 DIAGNOSIS — E119 Type 2 diabetes mellitus without complications: Secondary | ICD-10-CM | POA: Diagnosis not present

## 2018-12-02 ENCOUNTER — Ambulatory Visit (INDEPENDENT_AMBULATORY_CARE_PROVIDER_SITE_OTHER): Payer: Medicare Other | Admitting: Physician Assistant

## 2018-12-02 ENCOUNTER — Encounter: Payer: Self-pay | Admitting: Physician Assistant

## 2018-12-02 VITALS — BP 108/54 | HR 63 | Ht 69.0 in | Wt 202.8 lb

## 2018-12-02 DIAGNOSIS — R079 Chest pain, unspecified: Secondary | ICD-10-CM | POA: Insufficient documentation

## 2018-12-02 DIAGNOSIS — I4892 Unspecified atrial flutter: Secondary | ICD-10-CM

## 2018-12-02 DIAGNOSIS — N186 End stage renal disease: Secondary | ICD-10-CM

## 2018-12-02 DIAGNOSIS — I5042 Chronic combined systolic (congestive) and diastolic (congestive) heart failure: Secondary | ICD-10-CM | POA: Diagnosis not present

## 2018-12-02 DIAGNOSIS — I6930 Unspecified sequelae of cerebral infarction: Secondary | ICD-10-CM | POA: Diagnosis not present

## 2018-12-02 DIAGNOSIS — Z951 Presence of aortocoronary bypass graft: Secondary | ICD-10-CM | POA: Diagnosis not present

## 2018-12-02 NOTE — Patient Instructions (Addendum)
Medication Instructions:  Your physician recommends that you continue on your current medications as directed. Please refer to the Current Medication list given to you today.  If you need a refill on your cardiac medications before your next appointment, please call your pharmacy.   Lab work: None Ordered  If you have labs (blood work) drawn today and your tests are completely normal, you will receive your results only by: . MyChart Message (if you have MyChart) OR . A paper copy in the mail If you have any lab test that is abnormal or we need to change your treatment, we will call you to review the results.  Testing/Procedures: None ordered  Follow-Up: At [facility], you and your health needs are our priority.  As part of our continuing mission to provide you with exceptional heart care, we have created designated Provider Care Teams.  These Care Teams include your primary Cardiologist (physician) and Advanced Practice Providers (APPs -  Physician Assistants and Nurse Practitioners) who all work together to provide you with the care you need, when you need it. . You will need a follow up appointment in 6 months.  Please call our office 2 months in advance to schedule this appointment.  You may see Jay Varanasi, MD or one of the following Advanced Practice Providers on your designated Care Team:   . Brittainy Simmons, [name] . Dayna Dunn, [name] . Michele Lenze, [name]  Any Other Special Instructions Will Be Listed Below (If Applicable).    

## 2018-12-02 NOTE — Progress Notes (Signed)
Cardiology Office Note    Date:  12/02/2018   ID:  Ernest [name], DOB 04-18-1946, MRN [id number]  PCP:  Patient, [name]  Cardiologist: Larae Grooms, MD EPS: None  Chief Complaint  Patient presents with  . Chest Pain    History of Present Illness:  Ernest [name] is a 73 y.o. male with history of CAD status post CABG x4 and mitral valve repair 09/13/2017, NSVT, persistent atrial flutter on amiodarone and Coumadin with plans for cardioversion but patient was not consistent with Coumadin monitoring.  Patient was placed in a nursing facility but left AMA, ESRD on hemodialysis, chronic combined systolic and diastolic CHF, CVA, HLD, DM type II, hypertension and COPD.  Patient last saw Daune Perch, NP 03/11/2018 at which time there were a lot of social issues and patient had not converted to sinus rhythm with amiodarone so it was stopped as well as the Coumadin because of inadequate monitoring.  Patient sent back to [place] by Dr. Daphene Jaeger who saw him 11/04/2018 heart failure.  Note sent with only problem list and medications listed no treatment plan or labs.  Patient comes in today by himself. Doesn't know what meds he takes as his wife does his meds. Says he had a light pain in his chest 2 or 3 times so he thought he should get it checked. Says it was a little pressure when he was laying on his left side that only lasted a couple of seconds and improved when he turned on his back. No shortness of breath or radiation.  He also eats before he lays down but does not think it was indigestion.  He thinks it is just change of position.  In a wheel chair and no regular exercise. No chest pain on dialysis or when using a walker.  Past Medical History:  Diagnosis Date  . Abnormal stress test    s/p cath November 2013 with modest disease involving the ostial left main, proximal LAD, proximal RCA - do not appear to be hemodynamically signficant; mild LV dysfunction  . Bacteremia   . Chronic  systolic CHF (congestive heart failure) ([place])   . COPD (chronic obstructive pulmonary disease) ([place])   . Coronary artery disease    a. per cath report 2013, b. 09/13/2017-CABG X4 & Mitral valve repair  . Ejection fraction < 50%   . Erectile dysfunction    penile implant  . ESRD (end stage renal disease) ([place])    Started HD in [place] in 2009, ESRD was due to DM. Moved to [place] [facility] in Dec 2009 and now gets dialysis at [facility] on a MWF schedule.     . Gout    Hx: of  . Hepatitis C   . Hyperlipidemia   . Hypertension   . Mitral valve disease    s/p Mitral repair 09/13/17  . Obesity   . Retinopathy   . Shortness of breath    Hx: of with exertion  . Stroke ([place])   . Type II or unspecified type diabetes mellitus without mention of complication, not stated as uncontrolled    adult onset    Past Surgical History:  Procedure Laterality Date  . A/V FISTULAGRAM N/A 10/04/2017   Procedure: A/V FISTULAGRAM;  Surgeon: Elam Dutch, MD;  Location: [place] [facility];  Service: Cardiovascular;  Laterality: N/A;  . AV FISTULA PLACEMENT Left 01/13/2013   Procedure: INSERTION OF ARTERIOVENOUS (AV) GORE-TEX GRAFT ARM;  Surgeon: Angelia Mould, MD;  Location: [facility];  Service: Vascular;  Laterality: Left;  . AV FISTULA PLACEMENT Left 05/05/2013   Procedure: INSERTION OF LEFT UPPER ARM  ARTERIOVENOUS GORTEX GRAFT;  Surgeon: Angelia Mould, MD;  Location: [place];  Service: Vascular;  Laterality: Left;  . [place] REMOVAL Left 03/26/2013   Procedure: REMOVAL OF  NON INCORPORATED ARTERIOVENOUS GORETEX GRAFT ([place]) left arm * repair of  left brachial artery with vein patch angioplasty.;  Surgeon: Angelia Mould, MD;  Location: [place];  Service: Vascular;  Laterality: Left;  . CARDIAC CATHETERIZATION  August 26, 2012  . CATARACT EXTRACTION W/ INTRAOCULAR LENS  IMPLANT, BILATERAL    . COLONOSCOPY    . CORONARY ARTERY BYPASS GRAFT N/A 09/13/2017   Procedure: CORONARY ARTERY BYPASS GRAFTING  (CABG) times four LIMA  to LAD, SVG sequentially to OM and RAMUS Intermediate and SVG to Acute Marginal;  Surgeon: Melrose Nakayama, MD;  Location: [place];  Service: Open Heart Surgery;  Laterality: N/A;  . EMBOLECTOMY Right 08/27/2013   Procedure: EMBOLECTOMY;  Surgeon: Mal Misty, MD;  Location: [place];  Service: Vascular;  Laterality: Right;  Thrombectomy of Radial and ulnar artery.  [name] EYE SURGERY     laser.  and surgery for DM  . fistula     RUE and wrist  . INSERTION OF DIALYSIS CATHETER Right 01/01/2013   Procedure: INSERTION OF DIALYSIS CATHETER right  internal jugular;  Surgeon: Serafina Mitchell, MD;  Location: [place];  Service: Vascular;  Laterality: Right;  . INSERTION OF DIALYSIS CATHETER N/A 03/26/2013   Procedure: INSERTION OF DIALYSIS CATHETER Left internal jugular vein;  Surgeon: Angelia Mould, MD;  Location: [place];  Service: Vascular;  Laterality: N/A;  . LEFT HEART CATH AND CORONARY ANGIOGRAPHY N/A 09/09/2017   Procedure: LEFT HEART CATH AND CORONARY ANGIOGRAPHY;  Surgeon: Troy Sine, MD;  Location: [place] [facility];  Service: Cardiovascular;  Laterality: N/A;  . LIGATION OF ARTERIOVENOUS  FISTULA Right 12/30/2013   Procedure: REMOVAL OF SEGMENT OF GORTEX GRAFT AND FISTULA  AND REPAIR OF BRACHIAL ARTERY;  Surgeon: Mal Misty, MD;  Location: [place];  Service: Vascular;  Laterality: Right;  . MITRAL VALVE REPAIR N/A 09/13/2017   Procedure: MITRAL VALVE REPAIR (MVR);  Surgeon: Melrose Nakayama, MD;  Location: [facility];  Service: Open Heart Surgery;  Laterality: N/A;  . MITRAL VALVE REPAIR (MV)/CORONARY ARTERY BYPASS GRAFTING (CABG)  09/13/2017  . PENILE PROSTHESIS IMPLANT     1997.. no card  . PERIPHERAL VASCULAR BALLOON ANGIOPLASTY Left 10/04/2017   Procedure: PERIPHERAL VASCULAR BALLOON ANGIOPLASTY;  Surgeon: Elam Dutch, MD;  Location: [place] [facility];  Service: Cardiovascular;  Laterality: Left;  AVF  . TEE WITHOUT CARDIOVERSION N/A 06/11/2013    Procedure: TRANSESOPHAGEAL ECHOCARDIOGRAM (TEE);  Surgeon: Larey Dresser, MD;  Location[facility];  Service: Cardiovascular;  Laterality: N/A;  . TEE WITHOUT CARDIOVERSION N/A 09/13/2017   Procedure: TRANSESOPHAGEAL ECHOCARDIOGRAM (TEE);  Surgeon: Melrose Nakayama, MD;  Location: [place];  Service: Open Heart Surgery;  Laterality: N/A;  . THROMBECTOMY W/ EMBOLECTOMY Left 03/26/2013   Procedure: Attempted thrombectomy of left arm arteriovenous goretex graft.;  Surgeon: Angelia Mould, MD;  Location: [place];  Service: Vascular;  Laterality: Left;  [name] VENOGRAM N/A 04/07/2013   Procedure: VENOGRAM;  Surgeon: Serafina Mitchell, MD;  Location: [facility] CATH LAB;  Service: Cardiovascular;  Laterality: N/A;    Current Medications: Current Meds  Medication Sig  . aspirin EC  81 MG tablet Take 1 tablet (81 mg total) by mouth daily.  [name] atorvastatin (LIPITOR) 40 MG tablet Take 40 mg by mouth daily.   . B Complex-C-Folic Acid (NEPHRO-VITE PO) Take 1 tablet by mouth daily.  . calcium acetate (PHOSLO) 667 MG capsule Take 667 mg by mouth 3 (three) times daily with meals.   . cinacalcet (SENSIPAR) 30 MG tablet Take 4 tablets (120 mg total) by mouth daily with supper.  . Darbepoetin Alfa (ARANESP) 100 MCG/0.5ML SOSY injection Inject 100 mcg into the skin every 7 (seven) days. Administered with dialysis  . doxercalciferol (HECTOROL) 4 MCG/2ML injection Inject 2 mcg into the vein every Monday, Wednesday, and Friday with hemodialysis. Administered at dialysis  . ferric citrate (AURYXIA) 1 GM 210 MG(Fe) tablet Take 420 mg by mouth 3 (three) times daily with meals.   . ferric gluconate 125 mg in sodium chloride 0.9 % 100 mL Inject 250 mg into the vein every Monday, Wednesday, and Friday with hemodialysis.   [name] insulin detemir (LEVEMIR) 100 UNIT/ML injection Inject 0.2 mLs (20 Units total) into the skin 2 (two) times daily.  [name] levETIRAcetam (KEPPRA) 500 MG tablet Take 2 tablets (1,000 mg total) by mouth daily. Then  take extra 500 mg (1 tablet) on MWF after HD  . linaclotide (LINZESS) 145 MCG CAPS capsule Take 145 mcg by mouth daily.  . metoprolol tartrate (LOPRESSOR) 50 MG tablet Take 1 tablet (50 mg total) by mouth 2 (two) times daily.  . Nutritional Supplements (FEEDING SUPPLEMENT, NEPRO CARB STEADY,) LIQD Take 237 mLs by mouth 2 (two) times daily between meals.  . sennosides-docusate sodium (SENOKOT-S) 8.6-50 MG tablet Take 2 tablets by mouth 2 (two) times daily.  . sertraline (ZOLOFT) 25 MG tablet Take 25 mg by mouth at bedtime.     Allergies:   Patient has no known allergies.   Social History   Socioeconomic History  . Marital status: Married    Spouse name: Not on file  . Number of children: Not on file  . Years of education: Not on file  . Highest education level: Not on file  Occupational History  . Occupation: Cabin crew: DISABLED    Comment: disabled  Social Needs  . Financial resource strain: Not on file  . Food insecurity:    Worry: Not on file    Inability: Not on file  . Transportation needs:    Medical: Not on file    Non-medical: Not on file  Tobacco Use  . Smoking status: Former Smoker    Years: 7.00    Last attempt to quit: 06/10/1973    Years since quitting: 45.5  . Smokeless tobacco: Never Used  . Tobacco comment: Quit in 1974.  Substance and Sexual Activity  . Alcohol use: No    Alcohol/week: 0.0 standard drinks  . Drug use: No  . Sexual activity: Not on file  Lifestyle  . Physical activity:    Days per week: Not on file    Minutes per session: Not on file  . Stress: Not on file  Relationships  . Social connections:    Talks on phone: Not on file    Gets together: Not on file    Attends religious service: Not on file    Active member of club or organization: Not on file    Attends meetings of clubs or organizations: Not on file    Relationship status: Not on file  Other Topics Concern  . Not  on file  Social History Narrative   Adopted  mentally retarded child at home, 2 adopted children, 3 living and 2 deceased.     Family History:  The patient's family history includes Diabetes in his brother, sister, and son; Heart disease in his father.   ROS:   Please see the history of present illness.    Review of Systems  Constitution: Negative.  HENT: Negative.   Cardiovascular: Positive for chest pain.  Respiratory: Negative.   Endocrine: Negative.   Hematologic/Lymphatic: Negative.   Musculoskeletal: Positive for arthritis.  Gastrointestinal: Negative.   Genitourinary: Negative.   Neurological: Negative.    All other systems reviewed and are negative.   PHYSICAL EXAM:   VS:  BP (!) 108/54   Pulse 63   Ht 5\' 9"  (1.753 m)   Wt 202 lb 12.8 oz (92 kg)   SpO2 97%   BMI 29.95 kg/m   Physical Exam  GEN: Well nourished, well developed, in no acute distress in a wheelchair Neck: no JVD, carotid bruits, or masses Cardiac: Irregular 1/6 systolic murmur at the left sternal border Respiratory:  clear to auscultation bilaterally, normal work of breathing GI: soft, nontender, nondistended, + BS Ext: without cyanosis, clubbing, or edema, Good distal pulses bilaterally Neuro:  Alert and Oriented x 3 Psych: euthymic mood, full affect  Wt Readings from Last 3 Encounters:  12/02/18 202 lb 12.8 oz (92 kg)  04/22/18 190 lb (86.2 kg)  03/11/18 200 lb (90.7 kg)      Studies/Labs Reviewed:   EKG:  EKG is  ordered today.  The ekg ordered today demonstrates normal sinus rhythm with PAC, T wave inversion laterally unchanged from EKG last year  Recent Labs: 12/18/2017: BUN 17; Creatinine, Ser 5.21; Hemoglobin 11.3; Platelets 227; Potassium 4.6; Sodium 139   Lipid Panel    Component Value Date/Time   CHOL 116 10/28/2016 0442   TRIG 92 10/28/2016 0442   HDL 48 10/28/2016 0442   CHOLHDL 2.4 10/28/2016 0442   VLDL 18 10/28/2016 0442   LDLCALC 50 10/28/2016 0442    Additional studies/ records that were reviewed today include:     Echo 09/08/17 Study Conclusions - Left ventricle: Inferobasal and posterior lateral hypokinesis EF   similar to echo from 2016. The cavity size was normal. Wall   thickness was normal. Systolic function was mildly reduced. The   estimated ejection fraction was in the range of 45% to 50%. - Mitral valve: Calcified annulus. Mildly thickened leaflets .   There was mild regurgitation. - Left atrium: The atrium was moderately dilated. - Pulmonary arteries: PA peak pressure: 50 mm Hg (S).     Cardiac cath 09/09/17 Conclusion     Ost LM lesion is 65% stenosed.  Mid LM lesion is 90% stenosed.  Ost Cx to Prox Cx lesion is 100% stenosed.  Ost LAD lesion is 20% stenosed.  Acute Mrg lesion is 60% stenosed.   Significant coronary calcification with high-grade 65% ostial and 90-95% distal left main stenosis, calcification of the proximal LAD with 20-25% narrowing, large ramus intermediate vessel; total occlusion of the left circumflex at the ostium.  Dominant RCA with 60% ostial proximal narrowing in an RV marginal branch and collateralization to the distal circumflex.   LVEDP 14 mm Hg.   RECOMMENDATION: Surgical consultation for CABG revascularization.         ASSESSMENT:    1. S/P CABG x 4   2. Chest pain, unspecified type   3. Chronic combined systolic  and diastolic CHF (congestive heart failure) ([facility])   4. Paroxysmal atrial flutter ([place])   5. ESRD (end stage renal disease) ([place])   6. History of CVA with residual deficit      PLAN:  In order of problems listed above:  Chest pain when he was laying on his left side improved with laying on his back, fleeting and relieved within seconds.  No exertional symptoms or symptoms on dialysis.  EKG unchanged.  Continue current treatment.  Blood pressure too low to add any medications.  CAD status post CABG x4 mitral valve repair 08/2017 with some atypical chest pain that sounds positional.  Follow-up with Dr. Irish Lack in 6 months  or sooner if needed  Chronic combined systolic and diastolic CHF managed with hemodialysis.  Patient compensated today  Paroxysmal atrial flutter CHA2DS2-VASc equals 7.  Patient was noncompliant with Coumadin monitoring and it was stopped.  Not a candidate for NOAC with mitral valve repair  ESRD on HD  History of CVA with residual deficits including wheelchair and can walk with a walker and assistance on occasion.   Medication Adjustments/Labs and Tests Ordered: Current medicines are reviewed at length with the patient today.  Concerns regarding medicines are outlined above.  Medication changes, Labs and Tests ordered today are listed in the Patient Instructions below. Patient Instructions  Medication Instructions:  Your physician recommends that you continue on your current medications as directed. Please refer to the Current Medication list given to you today.  If you need a refill on your cardiac medications before your next appointment, please call your pharmacy.   Lab work: None Ordered  If you have labs (blood work) drawn today and your tests are completely normal, you will receive your results only by: [name] MyChart Message (if you have MyChart) OR . A paper copy in the mail If you have any lab test that is abnormal or we need to change your treatment, we will call you to review the results.  Testing/Procedures: None ordered  Follow-Up: At [facility], you and your health needs are our priority.  As part of our continuing mission to provide you with exceptional heart care, we have created designated Provider Care Teams.  These Care Teams include your primary Cardiologist (physician) and Advanced Practice Providers (APPs -  Physician Assistants and Nurse Practitioners) who all work together to provide you with the care you need, when you need it. . You will need a follow up appointment in 6 months.  Please call our office 2 months in advance to schedule this appointment.  You may  see Casandra Doffing, MD or one of the following Advanced Practice Providers on your designated Care Team:   . Lyda Jester, [name] . Dayna Dunn, [name] . Ermalinda Barrios, [name]  Any Other Special Instructions Will Be Listed Below (If Applicable).       Signed, Ermalinda Barrios, [name]  12/02/2018 1:20 PM    [facility] [address], [place], [place]  [address] Phone: [phone]; Fax: [phone]

## 2018-12-03 DIAGNOSIS — D509 Iron deficiency anemia, unspecified: Secondary | ICD-10-CM | POA: Diagnosis not present

## 2018-12-03 DIAGNOSIS — E119 Type 2 diabetes mellitus without complications: Secondary | ICD-10-CM | POA: Diagnosis not present

## 2018-12-03 DIAGNOSIS — N2581 Secondary hyperparathyroidism of renal origin: Secondary | ICD-10-CM | POA: Diagnosis not present

## 2018-12-03 DIAGNOSIS — N186 End stage renal disease: Secondary | ICD-10-CM | POA: Diagnosis not present

## 2018-12-03 DIAGNOSIS — D631 Anemia in chronic kidney disease: Secondary | ICD-10-CM | POA: Diagnosis not present

## 2018-12-05 DIAGNOSIS — E119 Type 2 diabetes mellitus without complications: Secondary | ICD-10-CM | POA: Diagnosis not present

## 2018-12-05 DIAGNOSIS — D509 Iron deficiency anemia, unspecified: Secondary | ICD-10-CM | POA: Diagnosis not present

## 2018-12-05 DIAGNOSIS — D631 Anemia in chronic kidney disease: Secondary | ICD-10-CM | POA: Diagnosis not present

## 2018-12-05 DIAGNOSIS — N2581 Secondary hyperparathyroidism of renal origin: Secondary | ICD-10-CM | POA: Diagnosis not present

## 2018-12-05 DIAGNOSIS — N186 End stage renal disease: Secondary | ICD-10-CM | POA: Diagnosis not present

## 2018-12-06 DIAGNOSIS — G40909 Epilepsy, unspecified, not intractable, without status epilepticus: Secondary | ICD-10-CM | POA: Diagnosis not present

## 2018-12-06 DIAGNOSIS — I120 Hypertensive chronic kidney disease with stage 5 chronic kidney disease or end stage renal disease: Secondary | ICD-10-CM | POA: Diagnosis not present

## 2018-12-06 DIAGNOSIS — G609 Hereditary and idiopathic neuropathy, unspecified: Secondary | ICD-10-CM | POA: Diagnosis not present

## 2018-12-06 DIAGNOSIS — I509 Heart failure, unspecified: Secondary | ICD-10-CM | POA: Diagnosis not present

## 2018-12-06 DIAGNOSIS — E118 Type 2 diabetes mellitus with unspecified complications: Secondary | ICD-10-CM | POA: Diagnosis not present

## 2018-12-06 DIAGNOSIS — N186 End stage renal disease: Secondary | ICD-10-CM | POA: Diagnosis not present

## 2018-12-06 DIAGNOSIS — Z7902 Long term (current) use of antithrombotics/antiplatelets: Secondary | ICD-10-CM | POA: Diagnosis not present

## 2018-12-06 DIAGNOSIS — F341 Dysthymic disorder: Secondary | ICD-10-CM | POA: Diagnosis not present

## 2018-12-08 DIAGNOSIS — N186 End stage renal disease: Secondary | ICD-10-CM | POA: Diagnosis not present

## 2018-12-08 DIAGNOSIS — N2581 Secondary hyperparathyroidism of renal origin: Secondary | ICD-10-CM | POA: Diagnosis not present

## 2018-12-08 DIAGNOSIS — D509 Iron deficiency anemia, unspecified: Secondary | ICD-10-CM | POA: Diagnosis not present

## 2018-12-08 DIAGNOSIS — E119 Type 2 diabetes mellitus without complications: Secondary | ICD-10-CM | POA: Diagnosis not present

## 2018-12-08 DIAGNOSIS — D631 Anemia in chronic kidney disease: Secondary | ICD-10-CM | POA: Diagnosis not present

## 2018-12-10 DIAGNOSIS — N2581 Secondary hyperparathyroidism of renal origin: Secondary | ICD-10-CM | POA: Diagnosis not present

## 2018-12-10 DIAGNOSIS — N186 End stage renal disease: Secondary | ICD-10-CM | POA: Diagnosis not present

## 2018-12-10 DIAGNOSIS — D631 Anemia in chronic kidney disease: Secondary | ICD-10-CM | POA: Diagnosis not present

## 2018-12-10 DIAGNOSIS — D509 Iron deficiency anemia, unspecified: Secondary | ICD-10-CM | POA: Diagnosis not present

## 2018-12-10 DIAGNOSIS — E119 Type 2 diabetes mellitus without complications: Secondary | ICD-10-CM | POA: Diagnosis not present

## 2018-12-12 DIAGNOSIS — E119 Type 2 diabetes mellitus without complications: Secondary | ICD-10-CM | POA: Diagnosis not present

## 2018-12-12 DIAGNOSIS — D631 Anemia in chronic kidney disease: Secondary | ICD-10-CM | POA: Diagnosis not present

## 2018-12-12 DIAGNOSIS — D509 Iron deficiency anemia, unspecified: Secondary | ICD-10-CM | POA: Diagnosis not present

## 2018-12-12 DIAGNOSIS — N2581 Secondary hyperparathyroidism of renal origin: Secondary | ICD-10-CM | POA: Diagnosis not present

## 2018-12-12 DIAGNOSIS — N186 End stage renal disease: Secondary | ICD-10-CM | POA: Diagnosis not present

## 2018-12-14 DIAGNOSIS — E1129 Type 2 diabetes mellitus with other diabetic kidney complication: Secondary | ICD-10-CM | POA: Diagnosis not present

## 2018-12-14 DIAGNOSIS — N186 End stage renal disease: Secondary | ICD-10-CM | POA: Diagnosis not present

## 2018-12-14 DIAGNOSIS — Z992 Dependence on renal dialysis: Secondary | ICD-10-CM | POA: Diagnosis not present

## 2018-12-15 DIAGNOSIS — D631 Anemia in chronic kidney disease: Secondary | ICD-10-CM | POA: Diagnosis not present

## 2018-12-15 DIAGNOSIS — E119 Type 2 diabetes mellitus without complications: Secondary | ICD-10-CM | POA: Diagnosis not present

## 2018-12-15 DIAGNOSIS — D509 Iron deficiency anemia, unspecified: Secondary | ICD-10-CM | POA: Diagnosis not present

## 2018-12-15 DIAGNOSIS — N2581 Secondary hyperparathyroidism of renal origin: Secondary | ICD-10-CM | POA: Diagnosis not present

## 2018-12-15 DIAGNOSIS — N186 End stage renal disease: Secondary | ICD-10-CM | POA: Diagnosis not present

## 2018-12-17 DIAGNOSIS — N2581 Secondary hyperparathyroidism of renal origin: Secondary | ICD-10-CM | POA: Diagnosis not present

## 2018-12-17 DIAGNOSIS — D509 Iron deficiency anemia, unspecified: Secondary | ICD-10-CM | POA: Diagnosis not present

## 2018-12-17 DIAGNOSIS — E119 Type 2 diabetes mellitus without complications: Secondary | ICD-10-CM | POA: Diagnosis not present

## 2018-12-17 DIAGNOSIS — N186 End stage renal disease: Secondary | ICD-10-CM | POA: Diagnosis not present

## 2018-12-17 DIAGNOSIS — D631 Anemia in chronic kidney disease: Secondary | ICD-10-CM | POA: Diagnosis not present

## 2018-12-19 DIAGNOSIS — N186 End stage renal disease: Secondary | ICD-10-CM | POA: Diagnosis not present

## 2018-12-19 DIAGNOSIS — D509 Iron deficiency anemia, unspecified: Secondary | ICD-10-CM | POA: Diagnosis not present

## 2018-12-19 DIAGNOSIS — E119 Type 2 diabetes mellitus without complications: Secondary | ICD-10-CM | POA: Diagnosis not present

## 2018-12-19 DIAGNOSIS — N2581 Secondary hyperparathyroidism of renal origin: Secondary | ICD-10-CM | POA: Diagnosis not present

## 2018-12-19 DIAGNOSIS — D631 Anemia in chronic kidney disease: Secondary | ICD-10-CM | POA: Diagnosis not present

## 2018-12-22 DIAGNOSIS — E119 Type 2 diabetes mellitus without complications: Secondary | ICD-10-CM | POA: Diagnosis not present

## 2018-12-22 DIAGNOSIS — D631 Anemia in chronic kidney disease: Secondary | ICD-10-CM | POA: Diagnosis not present

## 2018-12-22 DIAGNOSIS — N186 End stage renal disease: Secondary | ICD-10-CM | POA: Diagnosis not present

## 2018-12-22 DIAGNOSIS — N2581 Secondary hyperparathyroidism of renal origin: Secondary | ICD-10-CM | POA: Diagnosis not present

## 2018-12-22 DIAGNOSIS — D509 Iron deficiency anemia, unspecified: Secondary | ICD-10-CM | POA: Diagnosis not present

## 2018-12-24 DIAGNOSIS — D631 Anemia in chronic kidney disease: Secondary | ICD-10-CM | POA: Diagnosis not present

## 2018-12-24 DIAGNOSIS — N2581 Secondary hyperparathyroidism of renal origin: Secondary | ICD-10-CM | POA: Diagnosis not present

## 2018-12-24 DIAGNOSIS — E119 Type 2 diabetes mellitus without complications: Secondary | ICD-10-CM | POA: Diagnosis not present

## 2018-12-24 DIAGNOSIS — D509 Iron deficiency anemia, unspecified: Secondary | ICD-10-CM | POA: Diagnosis not present

## 2018-12-24 DIAGNOSIS — N186 End stage renal disease: Secondary | ICD-10-CM | POA: Diagnosis not present

## 2018-12-26 DIAGNOSIS — D631 Anemia in chronic kidney disease: Secondary | ICD-10-CM | POA: Diagnosis not present

## 2018-12-26 DIAGNOSIS — N2581 Secondary hyperparathyroidism of renal origin: Secondary | ICD-10-CM | POA: Diagnosis not present

## 2018-12-26 DIAGNOSIS — N186 End stage renal disease: Secondary | ICD-10-CM | POA: Diagnosis not present

## 2018-12-26 DIAGNOSIS — E119 Type 2 diabetes mellitus without complications: Secondary | ICD-10-CM | POA: Diagnosis not present

## 2018-12-26 DIAGNOSIS — D509 Iron deficiency anemia, unspecified: Secondary | ICD-10-CM | POA: Diagnosis not present

## 2018-12-29 DIAGNOSIS — D631 Anemia in chronic kidney disease: Secondary | ICD-10-CM | POA: Diagnosis not present

## 2018-12-29 DIAGNOSIS — N2581 Secondary hyperparathyroidism of renal origin: Secondary | ICD-10-CM | POA: Diagnosis not present

## 2018-12-29 DIAGNOSIS — D509 Iron deficiency anemia, unspecified: Secondary | ICD-10-CM | POA: Diagnosis not present

## 2018-12-29 DIAGNOSIS — N186 End stage renal disease: Secondary | ICD-10-CM | POA: Diagnosis not present

## 2018-12-29 DIAGNOSIS — E119 Type 2 diabetes mellitus without complications: Secondary | ICD-10-CM | POA: Diagnosis not present

## 2018-12-31 DIAGNOSIS — E119 Type 2 diabetes mellitus without complications: Secondary | ICD-10-CM | POA: Diagnosis not present

## 2018-12-31 DIAGNOSIS — N186 End stage renal disease: Secondary | ICD-10-CM | POA: Diagnosis not present

## 2018-12-31 DIAGNOSIS — D509 Iron deficiency anemia, unspecified: Secondary | ICD-10-CM | POA: Diagnosis not present

## 2018-12-31 DIAGNOSIS — D631 Anemia in chronic kidney disease: Secondary | ICD-10-CM | POA: Diagnosis not present

## 2018-12-31 DIAGNOSIS — N2581 Secondary hyperparathyroidism of renal origin: Secondary | ICD-10-CM | POA: Diagnosis not present

## 2019-01-02 DIAGNOSIS — D631 Anemia in chronic kidney disease: Secondary | ICD-10-CM | POA: Diagnosis not present

## 2019-01-02 DIAGNOSIS — N186 End stage renal disease: Secondary | ICD-10-CM | POA: Diagnosis not present

## 2019-01-02 DIAGNOSIS — E119 Type 2 diabetes mellitus without complications: Secondary | ICD-10-CM | POA: Diagnosis not present

## 2019-01-02 DIAGNOSIS — D509 Iron deficiency anemia, unspecified: Secondary | ICD-10-CM | POA: Diagnosis not present

## 2019-01-02 DIAGNOSIS — N2581 Secondary hyperparathyroidism of renal origin: Secondary | ICD-10-CM | POA: Diagnosis not present

## 2019-01-05 DIAGNOSIS — D631 Anemia in chronic kidney disease: Secondary | ICD-10-CM | POA: Diagnosis not present

## 2019-01-05 DIAGNOSIS — N186 End stage renal disease: Secondary | ICD-10-CM | POA: Diagnosis not present

## 2019-01-05 DIAGNOSIS — E119 Type 2 diabetes mellitus without complications: Secondary | ICD-10-CM | POA: Diagnosis not present

## 2019-01-05 DIAGNOSIS — D509 Iron deficiency anemia, unspecified: Secondary | ICD-10-CM | POA: Diagnosis not present

## 2019-01-05 DIAGNOSIS — N2581 Secondary hyperparathyroidism of renal origin: Secondary | ICD-10-CM | POA: Diagnosis not present

## 2019-01-07 DIAGNOSIS — D509 Iron deficiency anemia, unspecified: Secondary | ICD-10-CM | POA: Diagnosis not present

## 2019-01-07 DIAGNOSIS — E119 Type 2 diabetes mellitus without complications: Secondary | ICD-10-CM | POA: Diagnosis not present

## 2019-01-07 DIAGNOSIS — N2581 Secondary hyperparathyroidism of renal origin: Secondary | ICD-10-CM | POA: Diagnosis not present

## 2019-01-07 DIAGNOSIS — D631 Anemia in chronic kidney disease: Secondary | ICD-10-CM | POA: Diagnosis not present

## 2019-01-07 DIAGNOSIS — N186 End stage renal disease: Secondary | ICD-10-CM | POA: Diagnosis not present

## 2019-01-09 DIAGNOSIS — E119 Type 2 diabetes mellitus without complications: Secondary | ICD-10-CM | POA: Diagnosis not present

## 2019-01-09 DIAGNOSIS — N186 End stage renal disease: Secondary | ICD-10-CM | POA: Diagnosis not present

## 2019-01-09 DIAGNOSIS — D509 Iron deficiency anemia, unspecified: Secondary | ICD-10-CM | POA: Diagnosis not present

## 2019-01-09 DIAGNOSIS — D631 Anemia in chronic kidney disease: Secondary | ICD-10-CM | POA: Diagnosis not present

## 2019-01-09 DIAGNOSIS — N2581 Secondary hyperparathyroidism of renal origin: Secondary | ICD-10-CM | POA: Diagnosis not present

## 2019-01-12 DIAGNOSIS — D631 Anemia in chronic kidney disease: Secondary | ICD-10-CM | POA: Diagnosis not present

## 2019-01-12 DIAGNOSIS — D509 Iron deficiency anemia, unspecified: Secondary | ICD-10-CM | POA: Diagnosis not present

## 2019-01-12 DIAGNOSIS — N186 End stage renal disease: Secondary | ICD-10-CM | POA: Diagnosis not present

## 2019-01-12 DIAGNOSIS — E119 Type 2 diabetes mellitus without complications: Secondary | ICD-10-CM | POA: Diagnosis not present

## 2019-01-12 DIAGNOSIS — N2581 Secondary hyperparathyroidism of renal origin: Secondary | ICD-10-CM | POA: Diagnosis not present

## 2019-01-14 DIAGNOSIS — D509 Iron deficiency anemia, unspecified: Secondary | ICD-10-CM | POA: Diagnosis not present

## 2019-01-14 DIAGNOSIS — E119 Type 2 diabetes mellitus without complications: Secondary | ICD-10-CM | POA: Diagnosis not present

## 2019-01-14 DIAGNOSIS — D631 Anemia in chronic kidney disease: Secondary | ICD-10-CM | POA: Diagnosis not present

## 2019-01-14 DIAGNOSIS — Z992 Dependence on renal dialysis: Secondary | ICD-10-CM | POA: Diagnosis not present

## 2019-01-14 DIAGNOSIS — Z23 Encounter for immunization: Secondary | ICD-10-CM | POA: Diagnosis not present

## 2019-01-14 DIAGNOSIS — N2581 Secondary hyperparathyroidism of renal origin: Secondary | ICD-10-CM | POA: Diagnosis not present

## 2019-01-14 DIAGNOSIS — N186 End stage renal disease: Secondary | ICD-10-CM | POA: Diagnosis not present

## 2019-01-14 DIAGNOSIS — E1129 Type 2 diabetes mellitus with other diabetic kidney complication: Secondary | ICD-10-CM | POA: Diagnosis not present

## 2019-01-16 DIAGNOSIS — E119 Type 2 diabetes mellitus without complications: Secondary | ICD-10-CM | POA: Diagnosis not present

## 2019-01-16 DIAGNOSIS — D509 Iron deficiency anemia, unspecified: Secondary | ICD-10-CM | POA: Diagnosis not present

## 2019-01-16 DIAGNOSIS — D631 Anemia in chronic kidney disease: Secondary | ICD-10-CM | POA: Diagnosis not present

## 2019-01-16 DIAGNOSIS — N2581 Secondary hyperparathyroidism of renal origin: Secondary | ICD-10-CM | POA: Diagnosis not present

## 2019-01-16 DIAGNOSIS — N186 End stage renal disease: Secondary | ICD-10-CM | POA: Diagnosis not present

## 2019-01-16 DIAGNOSIS — Z23 Encounter for immunization: Secondary | ICD-10-CM | POA: Diagnosis not present

## 2019-01-19 DIAGNOSIS — D509 Iron deficiency anemia, unspecified: Secondary | ICD-10-CM | POA: Diagnosis not present

## 2019-01-19 DIAGNOSIS — Z23 Encounter for immunization: Secondary | ICD-10-CM | POA: Diagnosis not present

## 2019-01-19 DIAGNOSIS — D631 Anemia in chronic kidney disease: Secondary | ICD-10-CM | POA: Diagnosis not present

## 2019-01-19 DIAGNOSIS — N186 End stage renal disease: Secondary | ICD-10-CM | POA: Diagnosis not present

## 2019-01-19 DIAGNOSIS — E119 Type 2 diabetes mellitus without complications: Secondary | ICD-10-CM | POA: Diagnosis not present

## 2019-01-19 DIAGNOSIS — N2581 Secondary hyperparathyroidism of renal origin: Secondary | ICD-10-CM | POA: Diagnosis not present

## 2019-01-19 NOTE — Progress Notes (Signed)
Virtual Visit via Video Note The purpose of this virtual visit is to provide medical care while limiting exposure to the novel coronavirus.    Consent was obtained for video visit:  Yes.   Answered questions that patient had about telehealth interaction:  Yes.   I discussed the limitations, risks, security and privacy concerns of performing an evaluation and management service by telemedicine. I also discussed with the patient that there may be a patient responsible charge related to this service. The patient expressed understanding and agreed to proceed.  Pt location: Home Physician Location: office Name of referring provider:  No ref. provider found I connected with Ernest [name] at patients initiation/request on 01/20/2019 at  2:00 PM EDT by video enabled telemedicine application and verified that I am speaking with the correct person using two identifiers. Pt MRN:  [id number] Pt DOB:  [birth year] Video Participants:  Ernest [name]   History of Present Illness:  Ernest [name] is a 73 year old right-handed man with hyperlipidemia, ESRD on HD (M/W/F), type 2 diabetes mellitus, hypertension, COPD, gout, chronic CHF, atrial flutter, coronary artery disease, hepatitis C, penile implant (unable to get MRI) and history of stroke who follows up for seizures.  UPDATE: No recent seizures.  He has not been taking Keppra.  He was told at dialysis that he needed to stop taking it. He reports difficulty getting words out and requests referral to speech therapy.  HISTORY: Around October 2015, he developed slurred speech.He also experienced ringing in his right ear, with sensation of water in his right ear.He did not seek medical attention at the time.There was no facial droop or difficulty walking or swallowing.He decided to go to the ED on 09/21/14 for an evaluation as the slurred speech and tinnitus persisted.He denied weakness but ED exam revealed right arm weakness.CT of the head was  performed, which revealed chronic small vessel ischemic changes, including remote right cerebellar, left pontine and right basal ganglia lacunar infarcts.No acute infarction was seen.  On 04/25/15, he developed right hand cramping and dystonia.When EMS arrived, he had jerking of the right hand that then generalized to a tonic-clonic seizure.He had two other seizures, in the ambulance and in the ED, lasting 30-40 seconds.CT of the head showed chronic small vessel disease and moderate brain atrophy.He was started on Keppra 500mg  twice daily.  He was admitted to [facility] on 04/29/15 for seizure, which was thought to be secondary to hyperglycemia with a blood glucose level of 662. MRI of the brain was withheld because he had a penile implant that contains metal. Keppra was increased to 1000mg  daily with 500mg  following each dialysis.  He was re-admitted to [facility] again on 07/11/15 for presumed acute right pontine infarct. He presented with diplopia and unsteady gait. On exam, he was found to have one and a half syndrome. CT of head and repeat CT of head did not reveal acute infarct. 2D echo showed EF 45-50% with grade 1 diastolic dysfunction. Carotid doppler showed no hemodynamically significant ICA stenosis  He was admitted to [facility] from 10/27/16 to 10/29/16 for 2 to 3 days of persistent left leg weakness. He did not have other associated symptoms, such as other extremity weakness, numbness, visual disturbance, unsteady gait or worsening of residual dysarthria. Serum glucose was 263. CT of head was personally reviewed and was negative for acute findings. Unable to obtain MRI due to metal penile implant, so repeat head the CT on the following day was also negative. Carotid  doppler was negative for hemodynamically significant ICA stenosis. LDL was 50. Hgb A1c was elevated at 8.7. Weakness was thought to be due to hypotension during HD. He was advised to stop Lisinopril but  otherwise continue is current regimen with dual antiplatelet therapy.  Past Medical History: Past Medical History:  Diagnosis Date  . Abnormal stress test    s/p cath November 2013 with modest disease involving the ostial left main, proximal LAD, proximal RCA - do not appear to be hemodynamically signficant; mild LV dysfunction  . Bacteremia   . Chronic systolic CHF (congestive heart failure) ([place])   . COPD (chronic obstructive pulmonary disease) ([place])   . Coronary artery disease    a. per cath report 2013, b. 09/13/2017-CABG X4 & Mitral valve repair  . Ejection fraction < 50%   . Erectile dysfunction    penile implant  . ESRD (end stage renal disease) ([place])    Started HD in [place] in 2009, ESRD was due to DM. Moved to [facility] in Dec 2009 and now gets dialysis at [facility] on a MWF schedule.     . Gout    Hx: of  . Hepatitis C   . Hyperlipidemia   . Hypertension   . Mitral valve disease    s/p Mitral repair 09/13/17  . Obesity   . Retinopathy   . Shortness of breath    Hx: of with exertion  . Stroke ([place])   . Type II or unspecified type diabetes mellitus without mention of complication, not stated as uncontrolled    adult onset    Medications: Outpatient Encounter Medications as of 01/20/2019  Medication Sig  . aspirin EC 81 MG tablet Take 1 tablet (81 mg total) by mouth daily.  [name] atorvastatin (LIPITOR) 40 MG tablet Take 40 mg by mouth daily.   . B Complex-C-Folic Acid (NEPHRO-VITE PO) Take 1 tablet by mouth daily.  . calcium acetate (PHOSLO) 667 MG capsule Take 667 mg by mouth 3 (three) times daily with meals.   . cinacalcet (SENSIPAR) 30 MG tablet Take 4 tablets (120 mg total) by mouth daily with supper.  . Darbepoetin Alfa (ARANESP) 100 MCG/0.5ML SOSY injection Inject 100 mcg into the skin every 7 (seven) days. Administered with dialysis  . doxercalciferol (HECTOROL) 4 MCG/2ML injection Inject 2 mcg into the vein every Monday, Wednesday, and Friday with hemodialysis.  Administered at dialysis  . ferric citrate (AURYXIA) 1 GM 210 MG(Fe) tablet Take 420 mg by mouth 3 (three) times daily with meals.   . ferric gluconate 125 mg in sodium chloride 0.9 % 100 mL Inject 250 mg into the vein every Monday, Wednesday, and Friday with hemodialysis.   [name] insulin detemir (LEVEMIR) 100 UNIT/ML injection Inject 0.2 mLs (20 Units total) into the skin 2 (two) times daily.  [name] levETIRAcetam (KEPPRA) 500 MG tablet Take 2 tablets (1,000 mg total) by mouth daily. Then take extra 500 mg (1 tablet) on MWF after HD  . linaclotide (LINZESS) 145 MCG CAPS capsule Take 145 mcg by mouth daily.  . metoprolol tartrate (LOPRESSOR) 50 MG tablet Take 1 tablet (50 mg total) by mouth 2 (two) times daily.  . Nutritional Supplements (FEEDING SUPPLEMENT, NEPRO CARB STEADY,) LIQD Take 237 mLs by mouth 2 (two) times daily between meals.  . sennosides-docusate sodium (SENOKOT-S) 8.6-50 MG tablet Take 2 tablets by mouth 2 (two) times daily.  . sertraline (ZOLOFT) 25 MG tablet Take 25 mg by mouth at bedtime.   No facility-administered encounter medications  on file as of 01/20/2019.     Allergies: [name]  Family History: Family History  Problem Relation Age of Onset  . Heart disease Father   . Diabetes Sister   . Diabetes Brother   . Diabetes Son     Social History: Social History   Socioeconomic History  . Marital status: Married    Spouse name: Not on file  . Number of children: Not on file  . Years of education: Not on file  . Highest education level: Not on file  Occupational History  . Occupation: Cabin crew: DISABLED    Comment: disabled  Social Needs  . Financial resource strain: Not on file  . Food insecurity:    Worry: Not on file    Inability: Not on file  . Transportation needs:    Medical: Not on file    Non-medical: Not on file  Tobacco Use  . Smoking status: Former Smoker    Years: 7.00    Last attempt to quit: 06/10/1973    Years since  quitting: 45.6  . Smokeless tobacco: Never Used  . Tobacco comment: Quit in 1974.  Substance and Sexual Activity  . Alcohol use: No    Alcohol/week: 0.0 standard drinks  . Drug use: No  . Sexual activity: Not on file  Lifestyle  . Physical activity:    Days per week: Not on file    Minutes per session: Not on file  . Stress: Not on file  Relationships  . Social connections:    Talks on phone: Not on file    Gets together: Not on file    Attends religious service: Not on file    Active member of club or organization: Not on file    Attends meetings of clubs or organizations: Not on file    Relationship status: Not on file  . Intimate partner violence:    Fear of current or ex partner: Not on file    Emotionally abused: Not on file    Physically abused: Not on file    Forced sexual activity: Not on file  Other Topics Concern  . Not on file  Social History Narrative   Adopted mentally retarded child at home, 2 adopted children, 3 living and 2 deceased.   Observations/Objective:   alert and oriented to person, place, and time. Attention span and concentration intact, recent memory reduced, remote memory intact, fund of knowledge intact.  Speech fluent and not dysarthric, language intact.  Moves eyes in all directions.  Face symmetric. Assessment and Plan:   1.  Symptomatic localization-related seizure with secondary generalization 2.  Cerebrovascular disease with history of CVA 3.  Type 2 diabetes mellitus 4.  Hyperlipidemia 5.  Hypertension 6.  Coronary artery disease  He states that a doctor at dialysis told him to stop taking Keppra.  I contacted his nephrologist, Dr. Pearson Grippe, and he told me that he never told him to stop Keppra.  He has no objection to taking Keppra.  I reiterated this to Mr and Mrs Bhalla.  1.  Keppra 1000 mg daily with 500 mg following dialysis.  Sent new prescription to mail pharmacy. 2.  Aspirin 81 mg for secondary stroke prevention 3.  Statin  therapy as per PCP (LDL goal less than 70) 4.  Blood pressure control as per PCP 5.  Glycemic control as per PCP 6.  Follow-up in 1 year  Follow Up Instructions:    -I discussed  the assessment and treatment plan with the patient. The patient was provided an opportunity to ask questions and all were answered. The patient agreed with the plan and demonstrated an understanding of the instructions.   The patient was advised to call back or seek an in-person evaluation if the symptoms worsen or if the condition fails to improve as anticipated.    Total Time spent in visit with the patient was:  25, of which more than 50% of the time was spent in counseling and/or coordinating care on above.   Pt understands and agrees with the plan of care outlined.     Dudley Major, DO

## 2019-01-20 ENCOUNTER — Other Ambulatory Visit: Payer: Self-pay

## 2019-01-20 ENCOUNTER — Ambulatory Visit: Payer: Medicare Other | Admitting: Neurology

## 2019-01-20 ENCOUNTER — Encounter: Payer: Self-pay | Admitting: Neurology

## 2019-01-20 ENCOUNTER — Telehealth (INDEPENDENT_AMBULATORY_CARE_PROVIDER_SITE_OTHER): Payer: Medicare Other | Admitting: Neurology

## 2019-01-20 VITALS — Ht 69.0 in

## 2019-01-20 DIAGNOSIS — R569 Unspecified convulsions: Secondary | ICD-10-CM | POA: Diagnosis not present

## 2019-01-20 DIAGNOSIS — I69398 Other sequelae of cerebral infarction: Secondary | ICD-10-CM

## 2019-01-20 DIAGNOSIS — G40209 Localization-related (focal) (partial) symptomatic epilepsy and epileptic syndromes with complex partial seizures, not intractable, without status epilepticus: Secondary | ICD-10-CM

## 2019-01-20 MED ORDER — LEVETIRACETAM 500 MG PO TABS: 1000.0000 mg | ORAL_TABLET | Freq: Every day | ORAL | 3 refills | Status: DC

## 2019-01-21 DIAGNOSIS — D509 Iron deficiency anemia, unspecified: Secondary | ICD-10-CM | POA: Diagnosis not present

## 2019-01-21 DIAGNOSIS — E119 Type 2 diabetes mellitus without complications: Secondary | ICD-10-CM | POA: Diagnosis not present

## 2019-01-21 DIAGNOSIS — N2581 Secondary hyperparathyroidism of renal origin: Secondary | ICD-10-CM | POA: Diagnosis not present

## 2019-01-21 DIAGNOSIS — Z23 Encounter for immunization: Secondary | ICD-10-CM | POA: Diagnosis not present

## 2019-01-21 DIAGNOSIS — N186 End stage renal disease: Secondary | ICD-10-CM | POA: Diagnosis not present

## 2019-01-21 DIAGNOSIS — D631 Anemia in chronic kidney disease: Secondary | ICD-10-CM | POA: Diagnosis not present

## 2019-01-23 DIAGNOSIS — N186 End stage renal disease: Secondary | ICD-10-CM | POA: Diagnosis not present

## 2019-01-23 DIAGNOSIS — E119 Type 2 diabetes mellitus without complications: Secondary | ICD-10-CM | POA: Diagnosis not present

## 2019-01-23 DIAGNOSIS — Z23 Encounter for immunization: Secondary | ICD-10-CM | POA: Diagnosis not present

## 2019-01-23 DIAGNOSIS — N2581 Secondary hyperparathyroidism of renal origin: Secondary | ICD-10-CM | POA: Diagnosis not present

## 2019-01-23 DIAGNOSIS — D631 Anemia in chronic kidney disease: Secondary | ICD-10-CM | POA: Diagnosis not present

## 2019-01-23 DIAGNOSIS — D509 Iron deficiency anemia, unspecified: Secondary | ICD-10-CM | POA: Diagnosis not present

## 2019-01-26 DIAGNOSIS — E119 Type 2 diabetes mellitus without complications: Secondary | ICD-10-CM | POA: Diagnosis not present

## 2019-01-26 DIAGNOSIS — D509 Iron deficiency anemia, unspecified: Secondary | ICD-10-CM | POA: Diagnosis not present

## 2019-01-26 DIAGNOSIS — N2581 Secondary hyperparathyroidism of renal origin: Secondary | ICD-10-CM | POA: Diagnosis not present

## 2019-01-26 DIAGNOSIS — D631 Anemia in chronic kidney disease: Secondary | ICD-10-CM | POA: Diagnosis not present

## 2019-01-26 DIAGNOSIS — Z23 Encounter for immunization: Secondary | ICD-10-CM | POA: Diagnosis not present

## 2019-01-26 DIAGNOSIS — N186 End stage renal disease: Secondary | ICD-10-CM | POA: Diagnosis not present

## 2019-01-28 DIAGNOSIS — E119 Type 2 diabetes mellitus without complications: Secondary | ICD-10-CM | POA: Diagnosis not present

## 2019-01-28 DIAGNOSIS — N186 End stage renal disease: Secondary | ICD-10-CM | POA: Diagnosis not present

## 2019-01-28 DIAGNOSIS — Z23 Encounter for immunization: Secondary | ICD-10-CM | POA: Diagnosis not present

## 2019-01-28 DIAGNOSIS — D631 Anemia in chronic kidney disease: Secondary | ICD-10-CM | POA: Diagnosis not present

## 2019-01-28 DIAGNOSIS — N2581 Secondary hyperparathyroidism of renal origin: Secondary | ICD-10-CM | POA: Diagnosis not present

## 2019-01-28 DIAGNOSIS — D509 Iron deficiency anemia, unspecified: Secondary | ICD-10-CM | POA: Diagnosis not present

## 2019-01-30 DIAGNOSIS — N2581 Secondary hyperparathyroidism of renal origin: Secondary | ICD-10-CM | POA: Diagnosis not present

## 2019-01-30 DIAGNOSIS — E119 Type 2 diabetes mellitus without complications: Secondary | ICD-10-CM | POA: Diagnosis not present

## 2019-01-30 DIAGNOSIS — D509 Iron deficiency anemia, unspecified: Secondary | ICD-10-CM | POA: Diagnosis not present

## 2019-01-30 DIAGNOSIS — Z23 Encounter for immunization: Secondary | ICD-10-CM | POA: Diagnosis not present

## 2019-01-30 DIAGNOSIS — N186 End stage renal disease: Secondary | ICD-10-CM | POA: Diagnosis not present

## 2019-01-30 DIAGNOSIS — D631 Anemia in chronic kidney disease: Secondary | ICD-10-CM | POA: Diagnosis not present

## 2019-01-31 DIAGNOSIS — G40909 Epilepsy, unspecified, not intractable, without status epilepticus: Secondary | ICD-10-CM | POA: Diagnosis not present

## 2019-01-31 DIAGNOSIS — N186 End stage renal disease: Secondary | ICD-10-CM | POA: Diagnosis not present

## 2019-01-31 DIAGNOSIS — F341 Dysthymic disorder: Secondary | ICD-10-CM | POA: Diagnosis not present

## 2019-01-31 DIAGNOSIS — E782 Mixed hyperlipidemia: Secondary | ICD-10-CM | POA: Diagnosis not present

## 2019-01-31 DIAGNOSIS — Z0000 Encounter for general adult medical examination without abnormal findings: Secondary | ICD-10-CM | POA: Diagnosis not present

## 2019-01-31 DIAGNOSIS — I10 Essential (primary) hypertension: Secondary | ICD-10-CM | POA: Diagnosis not present

## 2019-01-31 DIAGNOSIS — E1165 Type 2 diabetes mellitus with hyperglycemia: Secondary | ICD-10-CM | POA: Diagnosis not present

## 2019-01-31 DIAGNOSIS — G609 Hereditary and idiopathic neuropathy, unspecified: Secondary | ICD-10-CM | POA: Diagnosis not present

## 2019-01-31 DIAGNOSIS — Z7189 Other specified counseling: Secondary | ICD-10-CM | POA: Diagnosis not present

## 2019-01-31 DIAGNOSIS — I4891 Unspecified atrial fibrillation: Secondary | ICD-10-CM | POA: Diagnosis not present

## 2019-02-02 ENCOUNTER — Other Ambulatory Visit: Payer: Self-pay | Admitting: *Deleted

## 2019-02-02 ENCOUNTER — Encounter: Payer: Self-pay | Admitting: *Deleted

## 2019-02-02 DIAGNOSIS — D631 Anemia in chronic kidney disease: Secondary | ICD-10-CM | POA: Diagnosis not present

## 2019-02-02 DIAGNOSIS — D509 Iron deficiency anemia, unspecified: Secondary | ICD-10-CM | POA: Diagnosis not present

## 2019-02-02 DIAGNOSIS — N186 End stage renal disease: Secondary | ICD-10-CM | POA: Diagnosis not present

## 2019-02-02 DIAGNOSIS — N2581 Secondary hyperparathyroidism of renal origin: Secondary | ICD-10-CM | POA: Diagnosis not present

## 2019-02-02 DIAGNOSIS — E119 Type 2 diabetes mellitus without complications: Secondary | ICD-10-CM | POA: Diagnosis not present

## 2019-02-02 DIAGNOSIS — Z23 Encounter for immunization: Secondary | ICD-10-CM | POA: Diagnosis not present

## 2019-02-02 NOTE — Patient Outreach (Signed)
[place] [facility]) Care Management  02/02/2019  MADDYX WIECK 09-08-1946 [id number]   CSW made an initial attempt to try and contact patient today to perform the initial phone assessment, as well as assess and assist with social work needs and services, without success.  A HIPAA compliant message was left for patient on voicemail.  CSW is currently awaiting a return call.  CSW will make a second outreach attempt within the next 3-4 business days, if a return call is not received from patient in the meantime.  CSW will also mail an Outreach Letter to patient's home requesting that patient contact CSW if patient is interested in receiving social work services through [place] with Scientist, clinical (histocompatibility and immunogenetics).  Nat Christen, BSW, MSW, LCSW  Licensed Education officer, environmental Health System  Mailing [place] [address], [place], [place] [address] Physical Address-[address], [place],  [address] Toll Free Main # [phone] Fax # [phone] Cell # [phone]  Office # [phone] [email or web address]

## 2019-02-04 DIAGNOSIS — D631 Anemia in chronic kidney disease: Secondary | ICD-10-CM | POA: Diagnosis not present

## 2019-02-04 DIAGNOSIS — N186 End stage renal disease: Secondary | ICD-10-CM | POA: Diagnosis not present

## 2019-02-04 DIAGNOSIS — E1129 Type 2 diabetes mellitus with other diabetic kidney complication: Secondary | ICD-10-CM | POA: Diagnosis not present

## 2019-02-04 DIAGNOSIS — Z23 Encounter for immunization: Secondary | ICD-10-CM | POA: Diagnosis not present

## 2019-02-04 DIAGNOSIS — D509 Iron deficiency anemia, unspecified: Secondary | ICD-10-CM | POA: Diagnosis not present

## 2019-02-04 DIAGNOSIS — N2581 Secondary hyperparathyroidism of renal origin: Secondary | ICD-10-CM | POA: Diagnosis not present

## 2019-02-04 DIAGNOSIS — E119 Type 2 diabetes mellitus without complications: Secondary | ICD-10-CM | POA: Diagnosis not present

## 2019-02-06 ENCOUNTER — Other Ambulatory Visit: Payer: Self-pay | Admitting: *Deleted

## 2019-02-06 DIAGNOSIS — D631 Anemia in chronic kidney disease: Secondary | ICD-10-CM | POA: Diagnosis not present

## 2019-02-06 DIAGNOSIS — Z23 Encounter for immunization: Secondary | ICD-10-CM | POA: Diagnosis not present

## 2019-02-06 DIAGNOSIS — D509 Iron deficiency anemia, unspecified: Secondary | ICD-10-CM | POA: Diagnosis not present

## 2019-02-06 DIAGNOSIS — N2581 Secondary hyperparathyroidism of renal origin: Secondary | ICD-10-CM | POA: Diagnosis not present

## 2019-02-06 DIAGNOSIS — N186 End stage renal disease: Secondary | ICD-10-CM | POA: Diagnosis not present

## 2019-02-06 DIAGNOSIS — E119 Type 2 diabetes mellitus without complications: Secondary | ICD-10-CM | POA: Diagnosis not present

## 2019-02-06 NOTE — Patient Outreach (Signed)
[place] [facility]) Care Management  02/06/2019  Ernest [name] 1946/04/27 [id number]   CSW made a second attempt to try and contact patient today to perform phone assessment, as well as assess and assist with social work needs and services, without success.  A HIPAA compliant message was left for patient on voicemail.  CSW continues to await a return call.  CSW will make a third and final outreach attempt within the next 3-4 business days, if a return call is not received from patient in the meantime.  CSW will then proceed with case closure if a return call is not received from patient with a total of 10 business days, as required number of phone attempts will have been made and outreach letter mailed.   Nat Christen, BSW, MSW, LCSW  Licensed Education officer, environmental Health System  Mailing [place] [address], [place], [place] [address] Physical Address[address], [place], [place] [address] Toll Free Main # [phone] Fax # [phone] Cell # [phone]  Office # [phone] [email or web address]

## 2019-02-09 DIAGNOSIS — D631 Anemia in chronic kidney disease: Secondary | ICD-10-CM | POA: Diagnosis not present

## 2019-02-09 DIAGNOSIS — E119 Type 2 diabetes mellitus without complications: Secondary | ICD-10-CM | POA: Diagnosis not present

## 2019-02-09 DIAGNOSIS — D509 Iron deficiency anemia, unspecified: Secondary | ICD-10-CM | POA: Diagnosis not present

## 2019-02-09 DIAGNOSIS — N186 End stage renal disease: Secondary | ICD-10-CM | POA: Diagnosis not present

## 2019-02-09 DIAGNOSIS — Z23 Encounter for immunization: Secondary | ICD-10-CM | POA: Diagnosis not present

## 2019-02-09 DIAGNOSIS — N2581 Secondary hyperparathyroidism of renal origin: Secondary | ICD-10-CM | POA: Diagnosis not present

## 2019-02-11 DIAGNOSIS — Z23 Encounter for immunization: Secondary | ICD-10-CM | POA: Diagnosis not present

## 2019-02-11 DIAGNOSIS — N186 End stage renal disease: Secondary | ICD-10-CM | POA: Diagnosis not present

## 2019-02-11 DIAGNOSIS — N2581 Secondary hyperparathyroidism of renal origin: Secondary | ICD-10-CM | POA: Diagnosis not present

## 2019-02-11 DIAGNOSIS — E119 Type 2 diabetes mellitus without complications: Secondary | ICD-10-CM | POA: Diagnosis not present

## 2019-02-11 DIAGNOSIS — D509 Iron deficiency anemia, unspecified: Secondary | ICD-10-CM | POA: Diagnosis not present

## 2019-02-11 DIAGNOSIS — D631 Anemia in chronic kidney disease: Secondary | ICD-10-CM | POA: Diagnosis not present

## 2019-02-12 ENCOUNTER — Other Ambulatory Visit: Payer: Self-pay | Admitting: *Deleted

## 2019-02-12 NOTE — Patient Outreach (Signed)
[place] [facility]) Care Management  02/12/2019  Ernest [name] 07-Oct-1946 [id number]    CSW made a third and final attempt to try and contact patient today to perform phone assessment, as well as assess and assist with social work needs and services, without success.  A HIPAA compliant message was left for patient on voicemail.  CSW is currently awaiting a return call.  CSW will proceed with case closure in two business days, if a return call is not received in the meantime, as required number of phone attempts have been made and an outreach letter was mailed to patient's home allowing 10 business days for a response.  Nat Christen, BSW, MSW, LCSW  Licensed Education officer, environmental Health System  Mailing [place] [address] [place], [place] [address] Physical Address-[address], [place], [place] [address] Toll Free Main # [phone] Fax # [phone] Cell # [phone]  Office # [phone] [email or web address]

## 2019-02-13 DIAGNOSIS — N2581 Secondary hyperparathyroidism of renal origin: Secondary | ICD-10-CM | POA: Diagnosis not present

## 2019-02-13 DIAGNOSIS — E119 Type 2 diabetes mellitus without complications: Secondary | ICD-10-CM | POA: Diagnosis not present

## 2019-02-13 DIAGNOSIS — N186 End stage renal disease: Secondary | ICD-10-CM | POA: Diagnosis not present

## 2019-02-13 DIAGNOSIS — D631 Anemia in chronic kidney disease: Secondary | ICD-10-CM | POA: Diagnosis not present

## 2019-02-13 DIAGNOSIS — E1129 Type 2 diabetes mellitus with other diabetic kidney complication: Secondary | ICD-10-CM | POA: Diagnosis not present

## 2019-02-13 DIAGNOSIS — D509 Iron deficiency anemia, unspecified: Secondary | ICD-10-CM | POA: Diagnosis not present

## 2019-02-13 DIAGNOSIS — Z992 Dependence on renal dialysis: Secondary | ICD-10-CM | POA: Diagnosis not present

## 2019-02-16 ENCOUNTER — Encounter: Payer: Self-pay | Admitting: *Deleted

## 2019-02-16 ENCOUNTER — Other Ambulatory Visit: Payer: Self-pay | Admitting: *Deleted

## 2019-02-16 DIAGNOSIS — D509 Iron deficiency anemia, unspecified: Secondary | ICD-10-CM | POA: Diagnosis not present

## 2019-02-16 DIAGNOSIS — D631 Anemia in chronic kidney disease: Secondary | ICD-10-CM | POA: Diagnosis not present

## 2019-02-16 DIAGNOSIS — N186 End stage renal disease: Secondary | ICD-10-CM | POA: Diagnosis not present

## 2019-02-16 DIAGNOSIS — E119 Type 2 diabetes mellitus without complications: Secondary | ICD-10-CM | POA: Diagnosis not present

## 2019-02-16 DIAGNOSIS — N2581 Secondary hyperparathyroidism of renal origin: Secondary | ICD-10-CM | POA: Diagnosis not present

## 2019-02-16 NOTE — Patient Outreach (Signed)
[place] [facility]) Care Management  02/16/2019  DONNA SILVERMAN [birth year] [id number]    CSW will perform a case closure on patient, due to inability to establish initial phone contact with patient, despite required number of phone attempts made and outreach letter mailed to patient's home, allowing 10 business days for a response if patient is interested in receiving social work services through [employer].    Nat Christen, BSW, MSW, LCSW  Licensed Education officer, environmental Health System  Mailing [place] [address], [place], [place] [address] Physical Address-[address], [place], [place] [address] Toll Free Main # [phone] Fax # [phone] Cell # [phone]  Office # [phone] [email or web address]

## 2019-02-18 DIAGNOSIS — N186 End stage renal disease: Secondary | ICD-10-CM | POA: Diagnosis not present

## 2019-02-18 DIAGNOSIS — E119 Type 2 diabetes mellitus without complications: Secondary | ICD-10-CM | POA: Diagnosis not present

## 2019-02-18 DIAGNOSIS — N2581 Secondary hyperparathyroidism of renal origin: Secondary | ICD-10-CM | POA: Diagnosis not present

## 2019-02-18 DIAGNOSIS — D509 Iron deficiency anemia, unspecified: Secondary | ICD-10-CM | POA: Diagnosis not present

## 2019-02-18 DIAGNOSIS — D631 Anemia in chronic kidney disease: Secondary | ICD-10-CM | POA: Diagnosis not present

## 2019-02-20 DIAGNOSIS — D509 Iron deficiency anemia, unspecified: Secondary | ICD-10-CM | POA: Diagnosis not present

## 2019-02-20 DIAGNOSIS — N186 End stage renal disease: Secondary | ICD-10-CM | POA: Diagnosis not present

## 2019-02-20 DIAGNOSIS — D631 Anemia in chronic kidney disease: Secondary | ICD-10-CM | POA: Diagnosis not present

## 2019-02-20 DIAGNOSIS — N2581 Secondary hyperparathyroidism of renal origin: Secondary | ICD-10-CM | POA: Diagnosis not present

## 2019-02-20 DIAGNOSIS — E119 Type 2 diabetes mellitus without complications: Secondary | ICD-10-CM | POA: Diagnosis not present

## 2019-02-23 DIAGNOSIS — N2581 Secondary hyperparathyroidism of renal origin: Secondary | ICD-10-CM | POA: Diagnosis not present

## 2019-02-23 DIAGNOSIS — E119 Type 2 diabetes mellitus without complications: Secondary | ICD-10-CM | POA: Diagnosis not present

## 2019-02-23 DIAGNOSIS — D631 Anemia in chronic kidney disease: Secondary | ICD-10-CM | POA: Diagnosis not present

## 2019-02-23 DIAGNOSIS — N186 End stage renal disease: Secondary | ICD-10-CM | POA: Diagnosis not present

## 2019-02-23 DIAGNOSIS — D509 Iron deficiency anemia, unspecified: Secondary | ICD-10-CM | POA: Diagnosis not present

## 2019-02-25 DIAGNOSIS — N186 End stage renal disease: Secondary | ICD-10-CM | POA: Diagnosis not present

## 2019-02-25 DIAGNOSIS — D631 Anemia in chronic kidney disease: Secondary | ICD-10-CM | POA: Diagnosis not present

## 2019-02-25 DIAGNOSIS — E119 Type 2 diabetes mellitus without complications: Secondary | ICD-10-CM | POA: Diagnosis not present

## 2019-02-25 DIAGNOSIS — N2581 Secondary hyperparathyroidism of renal origin: Secondary | ICD-10-CM | POA: Diagnosis not present

## 2019-02-25 DIAGNOSIS — D509 Iron deficiency anemia, unspecified: Secondary | ICD-10-CM | POA: Diagnosis not present

## 2019-02-27 DIAGNOSIS — D509 Iron deficiency anemia, unspecified: Secondary | ICD-10-CM | POA: Diagnosis not present

## 2019-02-27 DIAGNOSIS — E119 Type 2 diabetes mellitus without complications: Secondary | ICD-10-CM | POA: Diagnosis not present

## 2019-02-27 DIAGNOSIS — N186 End stage renal disease: Secondary | ICD-10-CM | POA: Diagnosis not present

## 2019-02-27 DIAGNOSIS — D631 Anemia in chronic kidney disease: Secondary | ICD-10-CM | POA: Diagnosis not present

## 2019-02-27 DIAGNOSIS — N2581 Secondary hyperparathyroidism of renal origin: Secondary | ICD-10-CM | POA: Diagnosis not present

## 2019-02-28 DIAGNOSIS — I509 Heart failure, unspecified: Secondary | ICD-10-CM | POA: Diagnosis not present

## 2019-02-28 DIAGNOSIS — I4892 Unspecified atrial flutter: Secondary | ICD-10-CM | POA: Diagnosis not present

## 2019-02-28 DIAGNOSIS — E118 Type 2 diabetes mellitus with unspecified complications: Secondary | ICD-10-CM | POA: Diagnosis not present

## 2019-02-28 DIAGNOSIS — J449 Chronic obstructive pulmonary disease, unspecified: Secondary | ICD-10-CM | POA: Diagnosis not present

## 2019-02-28 DIAGNOSIS — I120 Hypertensive chronic kidney disease with stage 5 chronic kidney disease or end stage renal disease: Secondary | ICD-10-CM | POA: Diagnosis not present

## 2019-03-02 DIAGNOSIS — N186 End stage renal disease: Secondary | ICD-10-CM | POA: Diagnosis not present

## 2019-03-02 DIAGNOSIS — E119 Type 2 diabetes mellitus without complications: Secondary | ICD-10-CM | POA: Diagnosis not present

## 2019-03-02 DIAGNOSIS — D631 Anemia in chronic kidney disease: Secondary | ICD-10-CM | POA: Diagnosis not present

## 2019-03-02 DIAGNOSIS — N2581 Secondary hyperparathyroidism of renal origin: Secondary | ICD-10-CM | POA: Diagnosis not present

## 2019-03-02 DIAGNOSIS — D509 Iron deficiency anemia, unspecified: Secondary | ICD-10-CM | POA: Diagnosis not present

## 2019-03-04 DIAGNOSIS — N186 End stage renal disease: Secondary | ICD-10-CM | POA: Diagnosis not present

## 2019-03-04 DIAGNOSIS — D631 Anemia in chronic kidney disease: Secondary | ICD-10-CM | POA: Diagnosis not present

## 2019-03-04 DIAGNOSIS — N2581 Secondary hyperparathyroidism of renal origin: Secondary | ICD-10-CM | POA: Diagnosis not present

## 2019-03-04 DIAGNOSIS — E119 Type 2 diabetes mellitus without complications: Secondary | ICD-10-CM | POA: Diagnosis not present

## 2019-03-04 DIAGNOSIS — D509 Iron deficiency anemia, unspecified: Secondary | ICD-10-CM | POA: Diagnosis not present

## 2019-03-06 DIAGNOSIS — D509 Iron deficiency anemia, unspecified: Secondary | ICD-10-CM | POA: Diagnosis not present

## 2019-03-06 DIAGNOSIS — N186 End stage renal disease: Secondary | ICD-10-CM | POA: Diagnosis not present

## 2019-03-06 DIAGNOSIS — E119 Type 2 diabetes mellitus without complications: Secondary | ICD-10-CM | POA: Diagnosis not present

## 2019-03-06 DIAGNOSIS — D631 Anemia in chronic kidney disease: Secondary | ICD-10-CM | POA: Diagnosis not present

## 2019-03-06 DIAGNOSIS — N2581 Secondary hyperparathyroidism of renal origin: Secondary | ICD-10-CM | POA: Diagnosis not present

## 2019-03-10 DIAGNOSIS — N2581 Secondary hyperparathyroidism of renal origin: Secondary | ICD-10-CM | POA: Diagnosis not present

## 2019-03-10 DIAGNOSIS — D509 Iron deficiency anemia, unspecified: Secondary | ICD-10-CM | POA: Diagnosis not present

## 2019-03-10 DIAGNOSIS — D631 Anemia in chronic kidney disease: Secondary | ICD-10-CM | POA: Diagnosis not present

## 2019-03-10 DIAGNOSIS — N186 End stage renal disease: Secondary | ICD-10-CM | POA: Diagnosis not present

## 2019-03-10 DIAGNOSIS — E119 Type 2 diabetes mellitus without complications: Secondary | ICD-10-CM | POA: Diagnosis not present

## 2019-03-11 DIAGNOSIS — D509 Iron deficiency anemia, unspecified: Secondary | ICD-10-CM | POA: Diagnosis not present

## 2019-03-11 DIAGNOSIS — N186 End stage renal disease: Secondary | ICD-10-CM | POA: Diagnosis not present

## 2019-03-11 DIAGNOSIS — E119 Type 2 diabetes mellitus without complications: Secondary | ICD-10-CM | POA: Diagnosis not present

## 2019-03-11 DIAGNOSIS — N2581 Secondary hyperparathyroidism of renal origin: Secondary | ICD-10-CM | POA: Diagnosis not present

## 2019-03-11 DIAGNOSIS — D631 Anemia in chronic kidney disease: Secondary | ICD-10-CM | POA: Diagnosis not present

## 2019-03-13 DIAGNOSIS — N186 End stage renal disease: Secondary | ICD-10-CM | POA: Diagnosis not present

## 2019-03-13 DIAGNOSIS — N2581 Secondary hyperparathyroidism of renal origin: Secondary | ICD-10-CM | POA: Diagnosis not present

## 2019-03-13 DIAGNOSIS — D631 Anemia in chronic kidney disease: Secondary | ICD-10-CM | POA: Diagnosis not present

## 2019-03-13 DIAGNOSIS — D509 Iron deficiency anemia, unspecified: Secondary | ICD-10-CM | POA: Diagnosis not present

## 2019-03-13 DIAGNOSIS — E119 Type 2 diabetes mellitus without complications: Secondary | ICD-10-CM | POA: Diagnosis not present

## 2019-03-16 DIAGNOSIS — D509 Iron deficiency anemia, unspecified: Secondary | ICD-10-CM | POA: Diagnosis not present

## 2019-03-16 DIAGNOSIS — N186 End stage renal disease: Secondary | ICD-10-CM | POA: Diagnosis not present

## 2019-03-16 DIAGNOSIS — Z992 Dependence on renal dialysis: Secondary | ICD-10-CM | POA: Diagnosis not present

## 2019-03-16 DIAGNOSIS — N2581 Secondary hyperparathyroidism of renal origin: Secondary | ICD-10-CM | POA: Diagnosis not present

## 2019-03-16 DIAGNOSIS — D631 Anemia in chronic kidney disease: Secondary | ICD-10-CM | POA: Diagnosis not present

## 2019-03-16 DIAGNOSIS — E1129 Type 2 diabetes mellitus with other diabetic kidney complication: Secondary | ICD-10-CM | POA: Diagnosis not present

## 2019-03-16 DIAGNOSIS — E119 Type 2 diabetes mellitus without complications: Secondary | ICD-10-CM | POA: Diagnosis not present

## 2019-03-18 DIAGNOSIS — N186 End stage renal disease: Secondary | ICD-10-CM | POA: Diagnosis not present

## 2019-03-18 DIAGNOSIS — D509 Iron deficiency anemia, unspecified: Secondary | ICD-10-CM | POA: Diagnosis not present

## 2019-03-18 DIAGNOSIS — D631 Anemia in chronic kidney disease: Secondary | ICD-10-CM | POA: Diagnosis not present

## 2019-03-18 DIAGNOSIS — N2581 Secondary hyperparathyroidism of renal origin: Secondary | ICD-10-CM | POA: Diagnosis not present

## 2019-03-18 DIAGNOSIS — E119 Type 2 diabetes mellitus without complications: Secondary | ICD-10-CM | POA: Diagnosis not present

## 2019-03-20 DIAGNOSIS — E119 Type 2 diabetes mellitus without complications: Secondary | ICD-10-CM | POA: Diagnosis not present

## 2019-03-20 DIAGNOSIS — D631 Anemia in chronic kidney disease: Secondary | ICD-10-CM | POA: Diagnosis not present

## 2019-03-20 DIAGNOSIS — N186 End stage renal disease: Secondary | ICD-10-CM | POA: Diagnosis not present

## 2019-03-20 DIAGNOSIS — D509 Iron deficiency anemia, unspecified: Secondary | ICD-10-CM | POA: Diagnosis not present

## 2019-03-20 DIAGNOSIS — N2581 Secondary hyperparathyroidism of renal origin: Secondary | ICD-10-CM | POA: Diagnosis not present

## 2019-03-23 DIAGNOSIS — D631 Anemia in chronic kidney disease: Secondary | ICD-10-CM | POA: Diagnosis not present

## 2019-03-23 DIAGNOSIS — E119 Type 2 diabetes mellitus without complications: Secondary | ICD-10-CM | POA: Diagnosis not present

## 2019-03-23 DIAGNOSIS — D509 Iron deficiency anemia, unspecified: Secondary | ICD-10-CM | POA: Diagnosis not present

## 2019-03-23 DIAGNOSIS — N186 End stage renal disease: Secondary | ICD-10-CM | POA: Diagnosis not present

## 2019-03-23 DIAGNOSIS — N2581 Secondary hyperparathyroidism of renal origin: Secondary | ICD-10-CM | POA: Diagnosis not present

## 2019-03-25 DIAGNOSIS — E119 Type 2 diabetes mellitus without complications: Secondary | ICD-10-CM | POA: Diagnosis not present

## 2019-03-25 DIAGNOSIS — D631 Anemia in chronic kidney disease: Secondary | ICD-10-CM | POA: Diagnosis not present

## 2019-03-25 DIAGNOSIS — N2581 Secondary hyperparathyroidism of renal origin: Secondary | ICD-10-CM | POA: Diagnosis not present

## 2019-03-25 DIAGNOSIS — D509 Iron deficiency anemia, unspecified: Secondary | ICD-10-CM | POA: Diagnosis not present

## 2019-03-25 DIAGNOSIS — N186 End stage renal disease: Secondary | ICD-10-CM | POA: Diagnosis not present

## 2019-03-27 DIAGNOSIS — D631 Anemia in chronic kidney disease: Secondary | ICD-10-CM | POA: Diagnosis not present

## 2019-03-27 DIAGNOSIS — N186 End stage renal disease: Secondary | ICD-10-CM | POA: Diagnosis not present

## 2019-03-27 DIAGNOSIS — D509 Iron deficiency anemia, unspecified: Secondary | ICD-10-CM | POA: Diagnosis not present

## 2019-03-27 DIAGNOSIS — E119 Type 2 diabetes mellitus without complications: Secondary | ICD-10-CM | POA: Diagnosis not present

## 2019-03-27 DIAGNOSIS — N2581 Secondary hyperparathyroidism of renal origin: Secondary | ICD-10-CM | POA: Diagnosis not present

## 2019-03-30 DIAGNOSIS — N2581 Secondary hyperparathyroidism of renal origin: Secondary | ICD-10-CM | POA: Diagnosis not present

## 2019-03-30 DIAGNOSIS — D631 Anemia in chronic kidney disease: Secondary | ICD-10-CM | POA: Diagnosis not present

## 2019-03-30 DIAGNOSIS — D509 Iron deficiency anemia, unspecified: Secondary | ICD-10-CM | POA: Diagnosis not present

## 2019-03-30 DIAGNOSIS — E119 Type 2 diabetes mellitus without complications: Secondary | ICD-10-CM | POA: Diagnosis not present

## 2019-03-30 DIAGNOSIS — N186 End stage renal disease: Secondary | ICD-10-CM | POA: Diagnosis not present

## 2019-04-01 DIAGNOSIS — D509 Iron deficiency anemia, unspecified: Secondary | ICD-10-CM | POA: Diagnosis not present

## 2019-04-01 DIAGNOSIS — E119 Type 2 diabetes mellitus without complications: Secondary | ICD-10-CM | POA: Diagnosis not present

## 2019-04-01 DIAGNOSIS — N2581 Secondary hyperparathyroidism of renal origin: Secondary | ICD-10-CM | POA: Diagnosis not present

## 2019-04-01 DIAGNOSIS — N186 End stage renal disease: Secondary | ICD-10-CM | POA: Diagnosis not present

## 2019-04-01 DIAGNOSIS — D631 Anemia in chronic kidney disease: Secondary | ICD-10-CM | POA: Diagnosis not present

## 2019-04-03 DIAGNOSIS — D509 Iron deficiency anemia, unspecified: Secondary | ICD-10-CM | POA: Diagnosis not present

## 2019-04-03 DIAGNOSIS — D631 Anemia in chronic kidney disease: Secondary | ICD-10-CM | POA: Diagnosis not present

## 2019-04-03 DIAGNOSIS — N2581 Secondary hyperparathyroidism of renal origin: Secondary | ICD-10-CM | POA: Diagnosis not present

## 2019-04-03 DIAGNOSIS — N186 End stage renal disease: Secondary | ICD-10-CM | POA: Diagnosis not present

## 2019-04-03 DIAGNOSIS — E119 Type 2 diabetes mellitus without complications: Secondary | ICD-10-CM | POA: Diagnosis not present

## 2019-04-06 DIAGNOSIS — E119 Type 2 diabetes mellitus without complications: Secondary | ICD-10-CM | POA: Diagnosis not present

## 2019-04-06 DIAGNOSIS — N2581 Secondary hyperparathyroidism of renal origin: Secondary | ICD-10-CM | POA: Diagnosis not present

## 2019-04-06 DIAGNOSIS — D631 Anemia in chronic kidney disease: Secondary | ICD-10-CM | POA: Diagnosis not present

## 2019-04-06 DIAGNOSIS — N186 End stage renal disease: Secondary | ICD-10-CM | POA: Diagnosis not present

## 2019-04-06 DIAGNOSIS — D509 Iron deficiency anemia, unspecified: Secondary | ICD-10-CM | POA: Diagnosis not present

## 2019-04-08 DIAGNOSIS — D631 Anemia in chronic kidney disease: Secondary | ICD-10-CM | POA: Diagnosis not present

## 2019-04-08 DIAGNOSIS — N186 End stage renal disease: Secondary | ICD-10-CM | POA: Diagnosis not present

## 2019-04-08 DIAGNOSIS — D509 Iron deficiency anemia, unspecified: Secondary | ICD-10-CM | POA: Diagnosis not present

## 2019-04-08 DIAGNOSIS — N2581 Secondary hyperparathyroidism of renal origin: Secondary | ICD-10-CM | POA: Diagnosis not present

## 2019-04-08 DIAGNOSIS — E119 Type 2 diabetes mellitus without complications: Secondary | ICD-10-CM | POA: Diagnosis not present

## 2019-04-10 DIAGNOSIS — N2581 Secondary hyperparathyroidism of renal origin: Secondary | ICD-10-CM | POA: Diagnosis not present

## 2019-04-10 DIAGNOSIS — N186 End stage renal disease: Secondary | ICD-10-CM | POA: Diagnosis not present

## 2019-04-10 DIAGNOSIS — E119 Type 2 diabetes mellitus without complications: Secondary | ICD-10-CM | POA: Diagnosis not present

## 2019-04-10 DIAGNOSIS — D631 Anemia in chronic kidney disease: Secondary | ICD-10-CM | POA: Diagnosis not present

## 2019-04-10 DIAGNOSIS — D509 Iron deficiency anemia, unspecified: Secondary | ICD-10-CM | POA: Diagnosis not present

## 2019-04-13 DIAGNOSIS — N186 End stage renal disease: Secondary | ICD-10-CM | POA: Diagnosis not present

## 2019-04-13 DIAGNOSIS — E119 Type 2 diabetes mellitus without complications: Secondary | ICD-10-CM | POA: Diagnosis not present

## 2019-04-13 DIAGNOSIS — D631 Anemia in chronic kidney disease: Secondary | ICD-10-CM | POA: Diagnosis not present

## 2019-04-13 DIAGNOSIS — D509 Iron deficiency anemia, unspecified: Secondary | ICD-10-CM | POA: Diagnosis not present

## 2019-04-13 DIAGNOSIS — N2581 Secondary hyperparathyroidism of renal origin: Secondary | ICD-10-CM | POA: Diagnosis not present

## 2019-04-15 DIAGNOSIS — E1129 Type 2 diabetes mellitus with other diabetic kidney complication: Secondary | ICD-10-CM | POA: Diagnosis not present

## 2019-04-15 DIAGNOSIS — D631 Anemia in chronic kidney disease: Secondary | ICD-10-CM | POA: Diagnosis not present

## 2019-04-15 DIAGNOSIS — N186 End stage renal disease: Secondary | ICD-10-CM | POA: Diagnosis not present

## 2019-04-15 DIAGNOSIS — N2581 Secondary hyperparathyroidism of renal origin: Secondary | ICD-10-CM | POA: Diagnosis not present

## 2019-04-15 DIAGNOSIS — E119 Type 2 diabetes mellitus without complications: Secondary | ICD-10-CM | POA: Diagnosis not present

## 2019-04-15 DIAGNOSIS — D509 Iron deficiency anemia, unspecified: Secondary | ICD-10-CM | POA: Diagnosis not present

## 2019-04-15 DIAGNOSIS — Z992 Dependence on renal dialysis: Secondary | ICD-10-CM | POA: Diagnosis not present

## 2019-04-17 DIAGNOSIS — N186 End stage renal disease: Secondary | ICD-10-CM | POA: Diagnosis not present

## 2019-04-17 DIAGNOSIS — D509 Iron deficiency anemia, unspecified: Secondary | ICD-10-CM | POA: Diagnosis not present

## 2019-04-17 DIAGNOSIS — E119 Type 2 diabetes mellitus without complications: Secondary | ICD-10-CM | POA: Diagnosis not present

## 2019-04-17 DIAGNOSIS — N2581 Secondary hyperparathyroidism of renal origin: Secondary | ICD-10-CM | POA: Diagnosis not present

## 2019-04-17 DIAGNOSIS — D631 Anemia in chronic kidney disease: Secondary | ICD-10-CM | POA: Diagnosis not present

## 2019-04-20 DIAGNOSIS — N186 End stage renal disease: Secondary | ICD-10-CM | POA: Diagnosis not present

## 2019-04-20 DIAGNOSIS — N2581 Secondary hyperparathyroidism of renal origin: Secondary | ICD-10-CM | POA: Diagnosis not present

## 2019-04-20 DIAGNOSIS — D631 Anemia in chronic kidney disease: Secondary | ICD-10-CM | POA: Diagnosis not present

## 2019-04-20 DIAGNOSIS — D509 Iron deficiency anemia, unspecified: Secondary | ICD-10-CM | POA: Diagnosis not present

## 2019-04-20 DIAGNOSIS — E119 Type 2 diabetes mellitus without complications: Secondary | ICD-10-CM | POA: Diagnosis not present

## 2019-04-22 DIAGNOSIS — D509 Iron deficiency anemia, unspecified: Secondary | ICD-10-CM | POA: Diagnosis not present

## 2019-04-22 DIAGNOSIS — N186 End stage renal disease: Secondary | ICD-10-CM | POA: Diagnosis not present

## 2019-04-22 DIAGNOSIS — E119 Type 2 diabetes mellitus without complications: Secondary | ICD-10-CM | POA: Diagnosis not present

## 2019-04-22 DIAGNOSIS — D631 Anemia in chronic kidney disease: Secondary | ICD-10-CM | POA: Diagnosis not present

## 2019-04-22 DIAGNOSIS — N2581 Secondary hyperparathyroidism of renal origin: Secondary | ICD-10-CM | POA: Diagnosis not present

## 2019-04-24 DIAGNOSIS — E119 Type 2 diabetes mellitus without complications: Secondary | ICD-10-CM | POA: Diagnosis not present

## 2019-04-24 DIAGNOSIS — D509 Iron deficiency anemia, unspecified: Secondary | ICD-10-CM | POA: Diagnosis not present

## 2019-04-24 DIAGNOSIS — N2581 Secondary hyperparathyroidism of renal origin: Secondary | ICD-10-CM | POA: Diagnosis not present

## 2019-04-24 DIAGNOSIS — D631 Anemia in chronic kidney disease: Secondary | ICD-10-CM | POA: Diagnosis not present

## 2019-04-24 DIAGNOSIS — N186 End stage renal disease: Secondary | ICD-10-CM | POA: Diagnosis not present

## 2019-04-27 DIAGNOSIS — D509 Iron deficiency anemia, unspecified: Secondary | ICD-10-CM | POA: Diagnosis not present

## 2019-04-27 DIAGNOSIS — N2581 Secondary hyperparathyroidism of renal origin: Secondary | ICD-10-CM | POA: Diagnosis not present

## 2019-04-27 DIAGNOSIS — D631 Anemia in chronic kidney disease: Secondary | ICD-10-CM | POA: Diagnosis not present

## 2019-04-27 DIAGNOSIS — N186 End stage renal disease: Secondary | ICD-10-CM | POA: Diagnosis not present

## 2019-04-27 DIAGNOSIS — E119 Type 2 diabetes mellitus without complications: Secondary | ICD-10-CM | POA: Diagnosis not present

## 2019-04-29 DIAGNOSIS — N186 End stage renal disease: Secondary | ICD-10-CM | POA: Diagnosis not present

## 2019-04-29 DIAGNOSIS — D631 Anemia in chronic kidney disease: Secondary | ICD-10-CM | POA: Diagnosis not present

## 2019-04-29 DIAGNOSIS — D509 Iron deficiency anemia, unspecified: Secondary | ICD-10-CM | POA: Diagnosis not present

## 2019-04-29 DIAGNOSIS — E119 Type 2 diabetes mellitus without complications: Secondary | ICD-10-CM | POA: Diagnosis not present

## 2019-04-29 DIAGNOSIS — N2581 Secondary hyperparathyroidism of renal origin: Secondary | ICD-10-CM | POA: Diagnosis not present

## 2019-05-01 DIAGNOSIS — N186 End stage renal disease: Secondary | ICD-10-CM | POA: Diagnosis not present

## 2019-05-01 DIAGNOSIS — E119 Type 2 diabetes mellitus without complications: Secondary | ICD-10-CM | POA: Diagnosis not present

## 2019-05-01 DIAGNOSIS — D509 Iron deficiency anemia, unspecified: Secondary | ICD-10-CM | POA: Diagnosis not present

## 2019-05-01 DIAGNOSIS — N2581 Secondary hyperparathyroidism of renal origin: Secondary | ICD-10-CM | POA: Diagnosis not present

## 2019-05-01 DIAGNOSIS — D631 Anemia in chronic kidney disease: Secondary | ICD-10-CM | POA: Diagnosis not present

## 2019-05-04 DIAGNOSIS — D509 Iron deficiency anemia, unspecified: Secondary | ICD-10-CM | POA: Diagnosis not present

## 2019-05-04 DIAGNOSIS — E119 Type 2 diabetes mellitus without complications: Secondary | ICD-10-CM | POA: Diagnosis not present

## 2019-05-04 DIAGNOSIS — D631 Anemia in chronic kidney disease: Secondary | ICD-10-CM | POA: Diagnosis not present

## 2019-05-04 DIAGNOSIS — N186 End stage renal disease: Secondary | ICD-10-CM | POA: Diagnosis not present

## 2019-05-04 DIAGNOSIS — N2581 Secondary hyperparathyroidism of renal origin: Secondary | ICD-10-CM | POA: Diagnosis not present

## 2019-05-06 DIAGNOSIS — D631 Anemia in chronic kidney disease: Secondary | ICD-10-CM | POA: Diagnosis not present

## 2019-05-06 DIAGNOSIS — N2581 Secondary hyperparathyroidism of renal origin: Secondary | ICD-10-CM | POA: Diagnosis not present

## 2019-05-06 DIAGNOSIS — E119 Type 2 diabetes mellitus without complications: Secondary | ICD-10-CM | POA: Diagnosis not present

## 2019-05-06 DIAGNOSIS — N186 End stage renal disease: Secondary | ICD-10-CM | POA: Diagnosis not present

## 2019-05-06 DIAGNOSIS — E1129 Type 2 diabetes mellitus with other diabetic kidney complication: Secondary | ICD-10-CM | POA: Diagnosis not present

## 2019-05-06 DIAGNOSIS — D509 Iron deficiency anemia, unspecified: Secondary | ICD-10-CM | POA: Diagnosis not present

## 2019-05-07 DIAGNOSIS — I1310 Hypertensive heart and chronic kidney disease without heart failure, with stage 1 through stage 4 chronic kidney disease, or unspecified chronic kidney disease: Secondary | ICD-10-CM | POA: Diagnosis not present

## 2019-05-07 DIAGNOSIS — Z7902 Long term (current) use of antithrombotics/antiplatelets: Secondary | ICD-10-CM | POA: Diagnosis not present

## 2019-05-07 DIAGNOSIS — E118 Type 2 diabetes mellitus with unspecified complications: Secondary | ICD-10-CM | POA: Diagnosis not present

## 2019-05-07 DIAGNOSIS — G629 Polyneuropathy, unspecified: Secondary | ICD-10-CM | POA: Diagnosis not present

## 2019-05-07 DIAGNOSIS — I509 Heart failure, unspecified: Secondary | ICD-10-CM | POA: Diagnosis not present

## 2019-05-08 DIAGNOSIS — D631 Anemia in chronic kidney disease: Secondary | ICD-10-CM | POA: Diagnosis not present

## 2019-05-08 DIAGNOSIS — D509 Iron deficiency anemia, unspecified: Secondary | ICD-10-CM | POA: Diagnosis not present

## 2019-05-08 DIAGNOSIS — N2581 Secondary hyperparathyroidism of renal origin: Secondary | ICD-10-CM | POA: Diagnosis not present

## 2019-05-08 DIAGNOSIS — N186 End stage renal disease: Secondary | ICD-10-CM | POA: Diagnosis not present

## 2019-05-08 DIAGNOSIS — E119 Type 2 diabetes mellitus without complications: Secondary | ICD-10-CM | POA: Diagnosis not present

## 2019-05-11 DIAGNOSIS — E119 Type 2 diabetes mellitus without complications: Secondary | ICD-10-CM | POA: Diagnosis not present

## 2019-05-11 DIAGNOSIS — D631 Anemia in chronic kidney disease: Secondary | ICD-10-CM | POA: Diagnosis not present

## 2019-05-11 DIAGNOSIS — N186 End stage renal disease: Secondary | ICD-10-CM | POA: Diagnosis not present

## 2019-05-11 DIAGNOSIS — N2581 Secondary hyperparathyroidism of renal origin: Secondary | ICD-10-CM | POA: Diagnosis not present

## 2019-05-11 DIAGNOSIS — D509 Iron deficiency anemia, unspecified: Secondary | ICD-10-CM | POA: Diagnosis not present

## 2019-05-13 DIAGNOSIS — D631 Anemia in chronic kidney disease: Secondary | ICD-10-CM | POA: Diagnosis not present

## 2019-05-13 DIAGNOSIS — N186 End stage renal disease: Secondary | ICD-10-CM | POA: Diagnosis not present

## 2019-05-13 DIAGNOSIS — N2581 Secondary hyperparathyroidism of renal origin: Secondary | ICD-10-CM | POA: Diagnosis not present

## 2019-05-13 DIAGNOSIS — D509 Iron deficiency anemia, unspecified: Secondary | ICD-10-CM | POA: Diagnosis not present

## 2019-05-13 DIAGNOSIS — E119 Type 2 diabetes mellitus without complications: Secondary | ICD-10-CM | POA: Diagnosis not present

## 2019-05-15 DIAGNOSIS — N2581 Secondary hyperparathyroidism of renal origin: Secondary | ICD-10-CM | POA: Diagnosis not present

## 2019-05-15 DIAGNOSIS — D509 Iron deficiency anemia, unspecified: Secondary | ICD-10-CM | POA: Diagnosis not present

## 2019-05-15 DIAGNOSIS — D631 Anemia in chronic kidney disease: Secondary | ICD-10-CM | POA: Diagnosis not present

## 2019-05-15 DIAGNOSIS — E119 Type 2 diabetes mellitus without complications: Secondary | ICD-10-CM | POA: Diagnosis not present

## 2019-05-15 DIAGNOSIS — N186 End stage renal disease: Secondary | ICD-10-CM | POA: Diagnosis not present

## 2019-05-16 DIAGNOSIS — E1129 Type 2 diabetes mellitus with other diabetic kidney complication: Secondary | ICD-10-CM | POA: Diagnosis not present

## 2019-05-16 DIAGNOSIS — N186 End stage renal disease: Secondary | ICD-10-CM | POA: Diagnosis not present

## 2019-05-16 DIAGNOSIS — Z992 Dependence on renal dialysis: Secondary | ICD-10-CM | POA: Diagnosis not present

## 2019-05-18 DIAGNOSIS — D631 Anemia in chronic kidney disease: Secondary | ICD-10-CM | POA: Diagnosis not present

## 2019-05-18 DIAGNOSIS — Z992 Dependence on renal dialysis: Secondary | ICD-10-CM | POA: Diagnosis not present

## 2019-05-18 DIAGNOSIS — N2581 Secondary hyperparathyroidism of renal origin: Secondary | ICD-10-CM | POA: Diagnosis not present

## 2019-05-18 DIAGNOSIS — E119 Type 2 diabetes mellitus without complications: Secondary | ICD-10-CM | POA: Diagnosis not present

## 2019-05-18 DIAGNOSIS — N186 End stage renal disease: Secondary | ICD-10-CM | POA: Diagnosis not present

## 2019-05-18 DIAGNOSIS — D509 Iron deficiency anemia, unspecified: Secondary | ICD-10-CM | POA: Diagnosis not present

## 2019-05-20 DIAGNOSIS — D631 Anemia in chronic kidney disease: Secondary | ICD-10-CM | POA: Diagnosis not present

## 2019-05-20 DIAGNOSIS — E119 Type 2 diabetes mellitus without complications: Secondary | ICD-10-CM | POA: Diagnosis not present

## 2019-05-20 DIAGNOSIS — N186 End stage renal disease: Secondary | ICD-10-CM | POA: Diagnosis not present

## 2019-05-20 DIAGNOSIS — D509 Iron deficiency anemia, unspecified: Secondary | ICD-10-CM | POA: Diagnosis not present

## 2019-05-20 DIAGNOSIS — N2581 Secondary hyperparathyroidism of renal origin: Secondary | ICD-10-CM | POA: Diagnosis not present

## 2019-05-20 DIAGNOSIS — Z992 Dependence on renal dialysis: Secondary | ICD-10-CM | POA: Diagnosis not present

## 2019-05-22 DIAGNOSIS — D509 Iron deficiency anemia, unspecified: Secondary | ICD-10-CM | POA: Diagnosis not present

## 2019-05-22 DIAGNOSIS — N186 End stage renal disease: Secondary | ICD-10-CM | POA: Diagnosis not present

## 2019-05-22 DIAGNOSIS — E119 Type 2 diabetes mellitus without complications: Secondary | ICD-10-CM | POA: Diagnosis not present

## 2019-05-22 DIAGNOSIS — N2581 Secondary hyperparathyroidism of renal origin: Secondary | ICD-10-CM | POA: Diagnosis not present

## 2019-05-22 DIAGNOSIS — D631 Anemia in chronic kidney disease: Secondary | ICD-10-CM | POA: Diagnosis not present

## 2019-05-22 DIAGNOSIS — Z992 Dependence on renal dialysis: Secondary | ICD-10-CM | POA: Diagnosis not present

## 2019-05-25 DIAGNOSIS — Z992 Dependence on renal dialysis: Secondary | ICD-10-CM | POA: Diagnosis not present

## 2019-05-25 DIAGNOSIS — E119 Type 2 diabetes mellitus without complications: Secondary | ICD-10-CM | POA: Diagnosis not present

## 2019-05-25 DIAGNOSIS — D509 Iron deficiency anemia, unspecified: Secondary | ICD-10-CM | POA: Diagnosis not present

## 2019-05-25 DIAGNOSIS — D631 Anemia in chronic kidney disease: Secondary | ICD-10-CM | POA: Diagnosis not present

## 2019-05-25 DIAGNOSIS — N186 End stage renal disease: Secondary | ICD-10-CM | POA: Diagnosis not present

## 2019-05-25 DIAGNOSIS — N2581 Secondary hyperparathyroidism of renal origin: Secondary | ICD-10-CM | POA: Diagnosis not present

## 2019-05-27 DIAGNOSIS — Z992 Dependence on renal dialysis: Secondary | ICD-10-CM | POA: Diagnosis not present

## 2019-05-27 DIAGNOSIS — D631 Anemia in chronic kidney disease: Secondary | ICD-10-CM | POA: Diagnosis not present

## 2019-05-27 DIAGNOSIS — N186 End stage renal disease: Secondary | ICD-10-CM | POA: Diagnosis not present

## 2019-05-27 DIAGNOSIS — E119 Type 2 diabetes mellitus without complications: Secondary | ICD-10-CM | POA: Diagnosis not present

## 2019-05-27 DIAGNOSIS — N2581 Secondary hyperparathyroidism of renal origin: Secondary | ICD-10-CM | POA: Diagnosis not present

## 2019-05-27 DIAGNOSIS — D509 Iron deficiency anemia, unspecified: Secondary | ICD-10-CM | POA: Diagnosis not present

## 2019-05-29 DIAGNOSIS — E119 Type 2 diabetes mellitus without complications: Secondary | ICD-10-CM | POA: Diagnosis not present

## 2019-05-29 DIAGNOSIS — N186 End stage renal disease: Secondary | ICD-10-CM | POA: Diagnosis not present

## 2019-05-29 DIAGNOSIS — Z992 Dependence on renal dialysis: Secondary | ICD-10-CM | POA: Diagnosis not present

## 2019-05-29 DIAGNOSIS — N2581 Secondary hyperparathyroidism of renal origin: Secondary | ICD-10-CM | POA: Diagnosis not present

## 2019-05-29 DIAGNOSIS — D509 Iron deficiency anemia, unspecified: Secondary | ICD-10-CM | POA: Diagnosis not present

## 2019-05-29 DIAGNOSIS — D631 Anemia in chronic kidney disease: Secondary | ICD-10-CM | POA: Diagnosis not present

## 2019-06-01 DIAGNOSIS — D509 Iron deficiency anemia, unspecified: Secondary | ICD-10-CM | POA: Diagnosis not present

## 2019-06-01 DIAGNOSIS — Z992 Dependence on renal dialysis: Secondary | ICD-10-CM | POA: Diagnosis not present

## 2019-06-01 DIAGNOSIS — N2581 Secondary hyperparathyroidism of renal origin: Secondary | ICD-10-CM | POA: Diagnosis not present

## 2019-06-01 DIAGNOSIS — N186 End stage renal disease: Secondary | ICD-10-CM | POA: Diagnosis not present

## 2019-06-01 DIAGNOSIS — E119 Type 2 diabetes mellitus without complications: Secondary | ICD-10-CM | POA: Diagnosis not present

## 2019-06-01 DIAGNOSIS — D631 Anemia in chronic kidney disease: Secondary | ICD-10-CM | POA: Diagnosis not present

## 2019-06-03 DIAGNOSIS — N2581 Secondary hyperparathyroidism of renal origin: Secondary | ICD-10-CM | POA: Diagnosis not present

## 2019-06-03 DIAGNOSIS — D509 Iron deficiency anemia, unspecified: Secondary | ICD-10-CM | POA: Diagnosis not present

## 2019-06-03 DIAGNOSIS — N186 End stage renal disease: Secondary | ICD-10-CM | POA: Diagnosis not present

## 2019-06-03 DIAGNOSIS — E119 Type 2 diabetes mellitus without complications: Secondary | ICD-10-CM | POA: Diagnosis not present

## 2019-06-03 DIAGNOSIS — D631 Anemia in chronic kidney disease: Secondary | ICD-10-CM | POA: Diagnosis not present

## 2019-06-03 DIAGNOSIS — Z992 Dependence on renal dialysis: Secondary | ICD-10-CM | POA: Diagnosis not present

## 2019-06-05 DIAGNOSIS — Z992 Dependence on renal dialysis: Secondary | ICD-10-CM | POA: Diagnosis not present

## 2019-06-05 DIAGNOSIS — D509 Iron deficiency anemia, unspecified: Secondary | ICD-10-CM | POA: Diagnosis not present

## 2019-06-05 DIAGNOSIS — N2581 Secondary hyperparathyroidism of renal origin: Secondary | ICD-10-CM | POA: Diagnosis not present

## 2019-06-05 DIAGNOSIS — N186 End stage renal disease: Secondary | ICD-10-CM | POA: Diagnosis not present

## 2019-06-05 DIAGNOSIS — D631 Anemia in chronic kidney disease: Secondary | ICD-10-CM | POA: Diagnosis not present

## 2019-06-05 DIAGNOSIS — E119 Type 2 diabetes mellitus without complications: Secondary | ICD-10-CM | POA: Diagnosis not present

## 2019-06-08 DIAGNOSIS — N186 End stage renal disease: Secondary | ICD-10-CM | POA: Diagnosis not present

## 2019-06-08 DIAGNOSIS — Z992 Dependence on renal dialysis: Secondary | ICD-10-CM | POA: Diagnosis not present

## 2019-06-08 DIAGNOSIS — N2581 Secondary hyperparathyroidism of renal origin: Secondary | ICD-10-CM | POA: Diagnosis not present

## 2019-06-08 DIAGNOSIS — D509 Iron deficiency anemia, unspecified: Secondary | ICD-10-CM | POA: Diagnosis not present

## 2019-06-08 DIAGNOSIS — D631 Anemia in chronic kidney disease: Secondary | ICD-10-CM | POA: Diagnosis not present

## 2019-06-08 DIAGNOSIS — E119 Type 2 diabetes mellitus without complications: Secondary | ICD-10-CM | POA: Diagnosis not present

## 2019-06-10 DIAGNOSIS — D509 Iron deficiency anemia, unspecified: Secondary | ICD-10-CM | POA: Diagnosis not present

## 2019-06-10 DIAGNOSIS — Z992 Dependence on renal dialysis: Secondary | ICD-10-CM | POA: Diagnosis not present

## 2019-06-10 DIAGNOSIS — D631 Anemia in chronic kidney disease: Secondary | ICD-10-CM | POA: Diagnosis not present

## 2019-06-10 DIAGNOSIS — E119 Type 2 diabetes mellitus without complications: Secondary | ICD-10-CM | POA: Diagnosis not present

## 2019-06-10 DIAGNOSIS — N2581 Secondary hyperparathyroidism of renal origin: Secondary | ICD-10-CM | POA: Diagnosis not present

## 2019-06-10 DIAGNOSIS — N186 End stage renal disease: Secondary | ICD-10-CM | POA: Diagnosis not present

## 2019-06-12 DIAGNOSIS — N186 End stage renal disease: Secondary | ICD-10-CM | POA: Diagnosis not present

## 2019-06-12 DIAGNOSIS — Z992 Dependence on renal dialysis: Secondary | ICD-10-CM | POA: Diagnosis not present

## 2019-06-12 DIAGNOSIS — N2581 Secondary hyperparathyroidism of renal origin: Secondary | ICD-10-CM | POA: Diagnosis not present

## 2019-06-12 DIAGNOSIS — E119 Type 2 diabetes mellitus without complications: Secondary | ICD-10-CM | POA: Diagnosis not present

## 2019-06-12 DIAGNOSIS — D509 Iron deficiency anemia, unspecified: Secondary | ICD-10-CM | POA: Diagnosis not present

## 2019-06-12 DIAGNOSIS — D631 Anemia in chronic kidney disease: Secondary | ICD-10-CM | POA: Diagnosis not present

## 2019-06-15 DIAGNOSIS — N186 End stage renal disease: Secondary | ICD-10-CM | POA: Diagnosis not present

## 2019-06-15 DIAGNOSIS — N2581 Secondary hyperparathyroidism of renal origin: Secondary | ICD-10-CM | POA: Diagnosis not present

## 2019-06-15 DIAGNOSIS — D631 Anemia in chronic kidney disease: Secondary | ICD-10-CM | POA: Diagnosis not present

## 2019-06-15 DIAGNOSIS — D509 Iron deficiency anemia, unspecified: Secondary | ICD-10-CM | POA: Diagnosis not present

## 2019-06-15 DIAGNOSIS — Z992 Dependence on renal dialysis: Secondary | ICD-10-CM | POA: Diagnosis not present

## 2019-06-15 DIAGNOSIS — E119 Type 2 diabetes mellitus without complications: Secondary | ICD-10-CM | POA: Diagnosis not present

## 2019-06-16 DIAGNOSIS — Z992 Dependence on renal dialysis: Secondary | ICD-10-CM | POA: Diagnosis not present

## 2019-06-16 DIAGNOSIS — N186 End stage renal disease: Secondary | ICD-10-CM | POA: Diagnosis not present

## 2019-06-16 DIAGNOSIS — E1129 Type 2 diabetes mellitus with other diabetic kidney complication: Secondary | ICD-10-CM | POA: Diagnosis not present

## 2019-06-17 DIAGNOSIS — D509 Iron deficiency anemia, unspecified: Secondary | ICD-10-CM | POA: Diagnosis not present

## 2019-06-17 DIAGNOSIS — N186 End stage renal disease: Secondary | ICD-10-CM | POA: Diagnosis not present

## 2019-06-17 DIAGNOSIS — D631 Anemia in chronic kidney disease: Secondary | ICD-10-CM | POA: Diagnosis not present

## 2019-06-17 DIAGNOSIS — N2581 Secondary hyperparathyroidism of renal origin: Secondary | ICD-10-CM | POA: Diagnosis not present

## 2019-06-17 DIAGNOSIS — Z992 Dependence on renal dialysis: Secondary | ICD-10-CM | POA: Diagnosis not present

## 2019-06-17 DIAGNOSIS — Z23 Encounter for immunization: Secondary | ICD-10-CM | POA: Diagnosis not present

## 2019-06-17 DIAGNOSIS — E119 Type 2 diabetes mellitus without complications: Secondary | ICD-10-CM | POA: Diagnosis not present

## 2019-06-19 DIAGNOSIS — E119 Type 2 diabetes mellitus without complications: Secondary | ICD-10-CM | POA: Diagnosis not present

## 2019-06-19 DIAGNOSIS — N186 End stage renal disease: Secondary | ICD-10-CM | POA: Diagnosis not present

## 2019-06-19 DIAGNOSIS — D509 Iron deficiency anemia, unspecified: Secondary | ICD-10-CM | POA: Diagnosis not present

## 2019-06-19 DIAGNOSIS — Z992 Dependence on renal dialysis: Secondary | ICD-10-CM | POA: Diagnosis not present

## 2019-06-19 DIAGNOSIS — N2581 Secondary hyperparathyroidism of renal origin: Secondary | ICD-10-CM | POA: Diagnosis not present

## 2019-06-19 DIAGNOSIS — D631 Anemia in chronic kidney disease: Secondary | ICD-10-CM | POA: Diagnosis not present

## 2019-06-22 DIAGNOSIS — E119 Type 2 diabetes mellitus without complications: Secondary | ICD-10-CM | POA: Diagnosis not present

## 2019-06-22 DIAGNOSIS — Z992 Dependence on renal dialysis: Secondary | ICD-10-CM | POA: Diagnosis not present

## 2019-06-22 DIAGNOSIS — N2581 Secondary hyperparathyroidism of renal origin: Secondary | ICD-10-CM | POA: Diagnosis not present

## 2019-06-22 DIAGNOSIS — N186 End stage renal disease: Secondary | ICD-10-CM | POA: Diagnosis not present

## 2019-06-22 DIAGNOSIS — D509 Iron deficiency anemia, unspecified: Secondary | ICD-10-CM | POA: Diagnosis not present

## 2019-06-22 DIAGNOSIS — D631 Anemia in chronic kidney disease: Secondary | ICD-10-CM | POA: Diagnosis not present

## 2019-06-24 DIAGNOSIS — N2581 Secondary hyperparathyroidism of renal origin: Secondary | ICD-10-CM | POA: Diagnosis not present

## 2019-06-24 DIAGNOSIS — N186 End stage renal disease: Secondary | ICD-10-CM | POA: Diagnosis not present

## 2019-06-24 DIAGNOSIS — D509 Iron deficiency anemia, unspecified: Secondary | ICD-10-CM | POA: Diagnosis not present

## 2019-06-24 DIAGNOSIS — Z992 Dependence on renal dialysis: Secondary | ICD-10-CM | POA: Diagnosis not present

## 2019-06-24 DIAGNOSIS — D631 Anemia in chronic kidney disease: Secondary | ICD-10-CM | POA: Diagnosis not present

## 2019-06-24 DIAGNOSIS — E119 Type 2 diabetes mellitus without complications: Secondary | ICD-10-CM | POA: Diagnosis not present

## 2019-06-26 DIAGNOSIS — D631 Anemia in chronic kidney disease: Secondary | ICD-10-CM | POA: Diagnosis not present

## 2019-06-26 DIAGNOSIS — N186 End stage renal disease: Secondary | ICD-10-CM | POA: Diagnosis not present

## 2019-06-26 DIAGNOSIS — Z992 Dependence on renal dialysis: Secondary | ICD-10-CM | POA: Diagnosis not present

## 2019-06-26 DIAGNOSIS — E119 Type 2 diabetes mellitus without complications: Secondary | ICD-10-CM | POA: Diagnosis not present

## 2019-06-26 DIAGNOSIS — D509 Iron deficiency anemia, unspecified: Secondary | ICD-10-CM | POA: Diagnosis not present

## 2019-06-26 DIAGNOSIS — N2581 Secondary hyperparathyroidism of renal origin: Secondary | ICD-10-CM | POA: Diagnosis not present

## 2019-06-29 DIAGNOSIS — Z992 Dependence on renal dialysis: Secondary | ICD-10-CM | POA: Diagnosis not present

## 2019-06-29 DIAGNOSIS — E119 Type 2 diabetes mellitus without complications: Secondary | ICD-10-CM | POA: Diagnosis not present

## 2019-06-29 DIAGNOSIS — N2581 Secondary hyperparathyroidism of renal origin: Secondary | ICD-10-CM | POA: Diagnosis not present

## 2019-06-29 DIAGNOSIS — D509 Iron deficiency anemia, unspecified: Secondary | ICD-10-CM | POA: Diagnosis not present

## 2019-06-29 DIAGNOSIS — N186 End stage renal disease: Secondary | ICD-10-CM | POA: Diagnosis not present

## 2019-06-29 DIAGNOSIS — D631 Anemia in chronic kidney disease: Secondary | ICD-10-CM | POA: Diagnosis not present

## 2019-07-01 DIAGNOSIS — D509 Iron deficiency anemia, unspecified: Secondary | ICD-10-CM | POA: Diagnosis not present

## 2019-07-01 DIAGNOSIS — N186 End stage renal disease: Secondary | ICD-10-CM | POA: Diagnosis not present

## 2019-07-01 DIAGNOSIS — E119 Type 2 diabetes mellitus without complications: Secondary | ICD-10-CM | POA: Diagnosis not present

## 2019-07-01 DIAGNOSIS — D631 Anemia in chronic kidney disease: Secondary | ICD-10-CM | POA: Diagnosis not present

## 2019-07-01 DIAGNOSIS — Z992 Dependence on renal dialysis: Secondary | ICD-10-CM | POA: Diagnosis not present

## 2019-07-01 DIAGNOSIS — N2581 Secondary hyperparathyroidism of renal origin: Secondary | ICD-10-CM | POA: Diagnosis not present

## 2019-07-03 DIAGNOSIS — N186 End stage renal disease: Secondary | ICD-10-CM | POA: Diagnosis not present

## 2019-07-03 DIAGNOSIS — Z992 Dependence on renal dialysis: Secondary | ICD-10-CM | POA: Diagnosis not present

## 2019-07-03 DIAGNOSIS — N2581 Secondary hyperparathyroidism of renal origin: Secondary | ICD-10-CM | POA: Diagnosis not present

## 2019-07-03 DIAGNOSIS — E119 Type 2 diabetes mellitus without complications: Secondary | ICD-10-CM | POA: Diagnosis not present

## 2019-07-03 DIAGNOSIS — D631 Anemia in chronic kidney disease: Secondary | ICD-10-CM | POA: Diagnosis not present

## 2019-07-03 DIAGNOSIS — D509 Iron deficiency anemia, unspecified: Secondary | ICD-10-CM | POA: Diagnosis not present

## 2019-07-06 DIAGNOSIS — D509 Iron deficiency anemia, unspecified: Secondary | ICD-10-CM | POA: Diagnosis not present

## 2019-07-06 DIAGNOSIS — Z992 Dependence on renal dialysis: Secondary | ICD-10-CM | POA: Diagnosis not present

## 2019-07-06 DIAGNOSIS — E119 Type 2 diabetes mellitus without complications: Secondary | ICD-10-CM | POA: Diagnosis not present

## 2019-07-06 DIAGNOSIS — N2581 Secondary hyperparathyroidism of renal origin: Secondary | ICD-10-CM | POA: Diagnosis not present

## 2019-07-06 DIAGNOSIS — N186 End stage renal disease: Secondary | ICD-10-CM | POA: Diagnosis not present

## 2019-07-06 DIAGNOSIS — D631 Anemia in chronic kidney disease: Secondary | ICD-10-CM | POA: Diagnosis not present

## 2019-07-08 DIAGNOSIS — Z992 Dependence on renal dialysis: Secondary | ICD-10-CM | POA: Diagnosis not present

## 2019-07-08 DIAGNOSIS — N2581 Secondary hyperparathyroidism of renal origin: Secondary | ICD-10-CM | POA: Diagnosis not present

## 2019-07-08 DIAGNOSIS — N186 End stage renal disease: Secondary | ICD-10-CM | POA: Diagnosis not present

## 2019-07-08 DIAGNOSIS — E119 Type 2 diabetes mellitus without complications: Secondary | ICD-10-CM | POA: Diagnosis not present

## 2019-07-08 DIAGNOSIS — D631 Anemia in chronic kidney disease: Secondary | ICD-10-CM | POA: Diagnosis not present

## 2019-07-08 DIAGNOSIS — D509 Iron deficiency anemia, unspecified: Secondary | ICD-10-CM | POA: Diagnosis not present

## 2019-07-10 DIAGNOSIS — N2581 Secondary hyperparathyroidism of renal origin: Secondary | ICD-10-CM | POA: Diagnosis not present

## 2019-07-10 DIAGNOSIS — D631 Anemia in chronic kidney disease: Secondary | ICD-10-CM | POA: Diagnosis not present

## 2019-07-10 DIAGNOSIS — N186 End stage renal disease: Secondary | ICD-10-CM | POA: Diagnosis not present

## 2019-07-10 DIAGNOSIS — D509 Iron deficiency anemia, unspecified: Secondary | ICD-10-CM | POA: Diagnosis not present

## 2019-07-10 DIAGNOSIS — Z992 Dependence on renal dialysis: Secondary | ICD-10-CM | POA: Diagnosis not present

## 2019-07-10 DIAGNOSIS — E119 Type 2 diabetes mellitus without complications: Secondary | ICD-10-CM | POA: Diagnosis not present

## 2019-07-13 DIAGNOSIS — D509 Iron deficiency anemia, unspecified: Secondary | ICD-10-CM | POA: Diagnosis not present

## 2019-07-13 DIAGNOSIS — E119 Type 2 diabetes mellitus without complications: Secondary | ICD-10-CM | POA: Diagnosis not present

## 2019-07-13 DIAGNOSIS — N2581 Secondary hyperparathyroidism of renal origin: Secondary | ICD-10-CM | POA: Diagnosis not present

## 2019-07-13 DIAGNOSIS — D631 Anemia in chronic kidney disease: Secondary | ICD-10-CM | POA: Diagnosis not present

## 2019-07-13 DIAGNOSIS — N186 End stage renal disease: Secondary | ICD-10-CM | POA: Diagnosis not present

## 2019-07-13 DIAGNOSIS — Z992 Dependence on renal dialysis: Secondary | ICD-10-CM | POA: Diagnosis not present

## 2019-07-15 DIAGNOSIS — N2581 Secondary hyperparathyroidism of renal origin: Secondary | ICD-10-CM | POA: Diagnosis not present

## 2019-07-15 DIAGNOSIS — D631 Anemia in chronic kidney disease: Secondary | ICD-10-CM | POA: Diagnosis not present

## 2019-07-15 DIAGNOSIS — N186 End stage renal disease: Secondary | ICD-10-CM | POA: Diagnosis not present

## 2019-07-15 DIAGNOSIS — Z992 Dependence on renal dialysis: Secondary | ICD-10-CM | POA: Diagnosis not present

## 2019-07-15 DIAGNOSIS — E119 Type 2 diabetes mellitus without complications: Secondary | ICD-10-CM | POA: Diagnosis not present

## 2019-07-15 DIAGNOSIS — D509 Iron deficiency anemia, unspecified: Secondary | ICD-10-CM | POA: Diagnosis not present

## 2019-07-16 DIAGNOSIS — Z992 Dependence on renal dialysis: Secondary | ICD-10-CM | POA: Diagnosis not present

## 2019-07-16 DIAGNOSIS — E1129 Type 2 diabetes mellitus with other diabetic kidney complication: Secondary | ICD-10-CM | POA: Diagnosis not present

## 2019-07-16 DIAGNOSIS — N186 End stage renal disease: Secondary | ICD-10-CM | POA: Diagnosis not present

## 2019-07-17 DIAGNOSIS — N2581 Secondary hyperparathyroidism of renal origin: Secondary | ICD-10-CM | POA: Diagnosis not present

## 2019-07-17 DIAGNOSIS — E119 Type 2 diabetes mellitus without complications: Secondary | ICD-10-CM | POA: Diagnosis not present

## 2019-07-17 DIAGNOSIS — N186 End stage renal disease: Secondary | ICD-10-CM | POA: Diagnosis not present

## 2019-07-17 DIAGNOSIS — D631 Anemia in chronic kidney disease: Secondary | ICD-10-CM | POA: Diagnosis not present

## 2019-07-17 DIAGNOSIS — Z992 Dependence on renal dialysis: Secondary | ICD-10-CM | POA: Diagnosis not present

## 2019-07-17 DIAGNOSIS — D509 Iron deficiency anemia, unspecified: Secondary | ICD-10-CM | POA: Diagnosis not present

## 2019-07-20 DIAGNOSIS — Z992 Dependence on renal dialysis: Secondary | ICD-10-CM | POA: Diagnosis not present

## 2019-07-20 DIAGNOSIS — N186 End stage renal disease: Secondary | ICD-10-CM | POA: Diagnosis not present

## 2019-07-20 DIAGNOSIS — D509 Iron deficiency anemia, unspecified: Secondary | ICD-10-CM | POA: Diagnosis not present

## 2019-07-20 DIAGNOSIS — D631 Anemia in chronic kidney disease: Secondary | ICD-10-CM | POA: Diagnosis not present

## 2019-07-20 DIAGNOSIS — N2581 Secondary hyperparathyroidism of renal origin: Secondary | ICD-10-CM | POA: Diagnosis not present

## 2019-07-20 DIAGNOSIS — E119 Type 2 diabetes mellitus without complications: Secondary | ICD-10-CM | POA: Diagnosis not present

## 2019-07-21 DIAGNOSIS — I1310 Hypertensive heart and chronic kidney disease without heart failure, with stage 1 through stage 4 chronic kidney disease, or unspecified chronic kidney disease: Secondary | ICD-10-CM | POA: Diagnosis not present

## 2019-07-21 DIAGNOSIS — E118 Type 2 diabetes mellitus with unspecified complications: Secondary | ICD-10-CM | POA: Diagnosis not present

## 2019-07-21 DIAGNOSIS — Z7902 Long term (current) use of antithrombotics/antiplatelets: Secondary | ICD-10-CM | POA: Diagnosis not present

## 2019-07-21 DIAGNOSIS — M13819 Other specified arthritis, unspecified shoulder: Secondary | ICD-10-CM | POA: Diagnosis not present

## 2019-07-22 DIAGNOSIS — E119 Type 2 diabetes mellitus without complications: Secondary | ICD-10-CM | POA: Diagnosis not present

## 2019-07-22 DIAGNOSIS — D509 Iron deficiency anemia, unspecified: Secondary | ICD-10-CM | POA: Diagnosis not present

## 2019-07-22 DIAGNOSIS — N2581 Secondary hyperparathyroidism of renal origin: Secondary | ICD-10-CM | POA: Diagnosis not present

## 2019-07-22 DIAGNOSIS — Z992 Dependence on renal dialysis: Secondary | ICD-10-CM | POA: Diagnosis not present

## 2019-07-22 DIAGNOSIS — N186 End stage renal disease: Secondary | ICD-10-CM | POA: Diagnosis not present

## 2019-07-22 DIAGNOSIS — D631 Anemia in chronic kidney disease: Secondary | ICD-10-CM | POA: Diagnosis not present

## 2019-07-24 DIAGNOSIS — Z992 Dependence on renal dialysis: Secondary | ICD-10-CM | POA: Diagnosis not present

## 2019-07-24 DIAGNOSIS — N186 End stage renal disease: Secondary | ICD-10-CM | POA: Diagnosis not present

## 2019-07-24 DIAGNOSIS — D509 Iron deficiency anemia, unspecified: Secondary | ICD-10-CM | POA: Diagnosis not present

## 2019-07-24 DIAGNOSIS — E119 Type 2 diabetes mellitus without complications: Secondary | ICD-10-CM | POA: Diagnosis not present

## 2019-07-24 DIAGNOSIS — N2581 Secondary hyperparathyroidism of renal origin: Secondary | ICD-10-CM | POA: Diagnosis not present

## 2019-07-24 DIAGNOSIS — D631 Anemia in chronic kidney disease: Secondary | ICD-10-CM | POA: Diagnosis not present

## 2019-07-27 DIAGNOSIS — D631 Anemia in chronic kidney disease: Secondary | ICD-10-CM | POA: Diagnosis not present

## 2019-07-27 DIAGNOSIS — N186 End stage renal disease: Secondary | ICD-10-CM | POA: Diagnosis not present

## 2019-07-27 DIAGNOSIS — D509 Iron deficiency anemia, unspecified: Secondary | ICD-10-CM | POA: Diagnosis not present

## 2019-07-27 DIAGNOSIS — E119 Type 2 diabetes mellitus without complications: Secondary | ICD-10-CM | POA: Diagnosis not present

## 2019-07-27 DIAGNOSIS — Z992 Dependence on renal dialysis: Secondary | ICD-10-CM | POA: Diagnosis not present

## 2019-07-27 DIAGNOSIS — N2581 Secondary hyperparathyroidism of renal origin: Secondary | ICD-10-CM | POA: Diagnosis not present

## 2019-07-29 DIAGNOSIS — D509 Iron deficiency anemia, unspecified: Secondary | ICD-10-CM | POA: Diagnosis not present

## 2019-07-29 DIAGNOSIS — N186 End stage renal disease: Secondary | ICD-10-CM | POA: Diagnosis not present

## 2019-07-29 DIAGNOSIS — N2581 Secondary hyperparathyroidism of renal origin: Secondary | ICD-10-CM | POA: Diagnosis not present

## 2019-07-29 DIAGNOSIS — E119 Type 2 diabetes mellitus without complications: Secondary | ICD-10-CM | POA: Diagnosis not present

## 2019-07-29 DIAGNOSIS — D631 Anemia in chronic kidney disease: Secondary | ICD-10-CM | POA: Diagnosis not present

## 2019-07-29 DIAGNOSIS — Z992 Dependence on renal dialysis: Secondary | ICD-10-CM | POA: Diagnosis not present

## 2019-07-31 DIAGNOSIS — D631 Anemia in chronic kidney disease: Secondary | ICD-10-CM | POA: Diagnosis not present

## 2019-07-31 DIAGNOSIS — Z992 Dependence on renal dialysis: Secondary | ICD-10-CM | POA: Diagnosis not present

## 2019-07-31 DIAGNOSIS — E119 Type 2 diabetes mellitus without complications: Secondary | ICD-10-CM | POA: Diagnosis not present

## 2019-07-31 DIAGNOSIS — N2581 Secondary hyperparathyroidism of renal origin: Secondary | ICD-10-CM | POA: Diagnosis not present

## 2019-07-31 DIAGNOSIS — N186 End stage renal disease: Secondary | ICD-10-CM | POA: Diagnosis not present

## 2019-07-31 DIAGNOSIS — D509 Iron deficiency anemia, unspecified: Secondary | ICD-10-CM | POA: Diagnosis not present

## 2019-08-03 DIAGNOSIS — N186 End stage renal disease: Secondary | ICD-10-CM | POA: Diagnosis not present

## 2019-08-03 DIAGNOSIS — D509 Iron deficiency anemia, unspecified: Secondary | ICD-10-CM | POA: Diagnosis not present

## 2019-08-03 DIAGNOSIS — E119 Type 2 diabetes mellitus without complications: Secondary | ICD-10-CM | POA: Diagnosis not present

## 2019-08-03 DIAGNOSIS — Z992 Dependence on renal dialysis: Secondary | ICD-10-CM | POA: Diagnosis not present

## 2019-08-03 DIAGNOSIS — N2581 Secondary hyperparathyroidism of renal origin: Secondary | ICD-10-CM | POA: Diagnosis not present

## 2019-08-03 DIAGNOSIS — D631 Anemia in chronic kidney disease: Secondary | ICD-10-CM | POA: Diagnosis not present

## 2019-08-05 DIAGNOSIS — N2581 Secondary hyperparathyroidism of renal origin: Secondary | ICD-10-CM | POA: Diagnosis not present

## 2019-08-05 DIAGNOSIS — D509 Iron deficiency anemia, unspecified: Secondary | ICD-10-CM | POA: Diagnosis not present

## 2019-08-05 DIAGNOSIS — N186 End stage renal disease: Secondary | ICD-10-CM | POA: Diagnosis not present

## 2019-08-05 DIAGNOSIS — Z992 Dependence on renal dialysis: Secondary | ICD-10-CM | POA: Diagnosis not present

## 2019-08-05 DIAGNOSIS — D631 Anemia in chronic kidney disease: Secondary | ICD-10-CM | POA: Diagnosis not present

## 2019-08-05 DIAGNOSIS — E119 Type 2 diabetes mellitus without complications: Secondary | ICD-10-CM | POA: Diagnosis not present

## 2019-08-07 DIAGNOSIS — N2581 Secondary hyperparathyroidism of renal origin: Secondary | ICD-10-CM | POA: Diagnosis not present

## 2019-08-07 DIAGNOSIS — N186 End stage renal disease: Secondary | ICD-10-CM | POA: Diagnosis not present

## 2019-08-07 DIAGNOSIS — E119 Type 2 diabetes mellitus without complications: Secondary | ICD-10-CM | POA: Diagnosis not present

## 2019-08-07 DIAGNOSIS — D631 Anemia in chronic kidney disease: Secondary | ICD-10-CM | POA: Diagnosis not present

## 2019-08-07 DIAGNOSIS — D509 Iron deficiency anemia, unspecified: Secondary | ICD-10-CM | POA: Diagnosis not present

## 2019-08-07 DIAGNOSIS — Z992 Dependence on renal dialysis: Secondary | ICD-10-CM | POA: Diagnosis not present

## 2019-08-10 DIAGNOSIS — N2581 Secondary hyperparathyroidism of renal origin: Secondary | ICD-10-CM | POA: Diagnosis not present

## 2019-08-10 DIAGNOSIS — N186 End stage renal disease: Secondary | ICD-10-CM | POA: Diagnosis not present

## 2019-08-10 DIAGNOSIS — E119 Type 2 diabetes mellitus without complications: Secondary | ICD-10-CM | POA: Diagnosis not present

## 2019-08-10 DIAGNOSIS — D631 Anemia in chronic kidney disease: Secondary | ICD-10-CM | POA: Diagnosis not present

## 2019-08-10 DIAGNOSIS — Z992 Dependence on renal dialysis: Secondary | ICD-10-CM | POA: Diagnosis not present

## 2019-08-10 DIAGNOSIS — D509 Iron deficiency anemia, unspecified: Secondary | ICD-10-CM | POA: Diagnosis not present

## 2019-08-12 DIAGNOSIS — N2581 Secondary hyperparathyroidism of renal origin: Secondary | ICD-10-CM | POA: Diagnosis not present

## 2019-08-12 DIAGNOSIS — D509 Iron deficiency anemia, unspecified: Secondary | ICD-10-CM | POA: Diagnosis not present

## 2019-08-12 DIAGNOSIS — Z992 Dependence on renal dialysis: Secondary | ICD-10-CM | POA: Diagnosis not present

## 2019-08-12 DIAGNOSIS — N186 End stage renal disease: Secondary | ICD-10-CM | POA: Diagnosis not present

## 2019-08-12 DIAGNOSIS — E119 Type 2 diabetes mellitus without complications: Secondary | ICD-10-CM | POA: Diagnosis not present

## 2019-08-12 DIAGNOSIS — E1129 Type 2 diabetes mellitus with other diabetic kidney complication: Secondary | ICD-10-CM | POA: Diagnosis not present

## 2019-08-12 DIAGNOSIS — D631 Anemia in chronic kidney disease: Secondary | ICD-10-CM | POA: Diagnosis not present

## 2019-08-14 DIAGNOSIS — N2581 Secondary hyperparathyroidism of renal origin: Secondary | ICD-10-CM | POA: Diagnosis not present

## 2019-08-14 DIAGNOSIS — N186 End stage renal disease: Secondary | ICD-10-CM | POA: Diagnosis not present

## 2019-08-14 DIAGNOSIS — D631 Anemia in chronic kidney disease: Secondary | ICD-10-CM | POA: Diagnosis not present

## 2019-08-14 DIAGNOSIS — E119 Type 2 diabetes mellitus without complications: Secondary | ICD-10-CM | POA: Diagnosis not present

## 2019-08-14 DIAGNOSIS — D509 Iron deficiency anemia, unspecified: Secondary | ICD-10-CM | POA: Diagnosis not present

## 2019-08-14 DIAGNOSIS — Z992 Dependence on renal dialysis: Secondary | ICD-10-CM | POA: Diagnosis not present

## 2019-08-16 DIAGNOSIS — E1129 Type 2 diabetes mellitus with other diabetic kidney complication: Secondary | ICD-10-CM | POA: Diagnosis not present

## 2019-08-16 DIAGNOSIS — N186 End stage renal disease: Secondary | ICD-10-CM | POA: Diagnosis not present

## 2019-08-16 DIAGNOSIS — Z992 Dependence on renal dialysis: Secondary | ICD-10-CM | POA: Diagnosis not present

## 2019-08-17 DIAGNOSIS — E119 Type 2 diabetes mellitus without complications: Secondary | ICD-10-CM | POA: Diagnosis not present

## 2019-08-17 DIAGNOSIS — D509 Iron deficiency anemia, unspecified: Secondary | ICD-10-CM | POA: Diagnosis not present

## 2019-08-17 DIAGNOSIS — N186 End stage renal disease: Secondary | ICD-10-CM | POA: Diagnosis not present

## 2019-08-17 DIAGNOSIS — N2581 Secondary hyperparathyroidism of renal origin: Secondary | ICD-10-CM | POA: Diagnosis not present

## 2019-08-17 DIAGNOSIS — D631 Anemia in chronic kidney disease: Secondary | ICD-10-CM | POA: Diagnosis not present

## 2019-08-17 DIAGNOSIS — Z992 Dependence on renal dialysis: Secondary | ICD-10-CM | POA: Diagnosis not present

## 2019-08-19 DIAGNOSIS — N186 End stage renal disease: Secondary | ICD-10-CM | POA: Diagnosis not present

## 2019-08-19 DIAGNOSIS — E119 Type 2 diabetes mellitus without complications: Secondary | ICD-10-CM | POA: Diagnosis not present

## 2019-08-19 DIAGNOSIS — D631 Anemia in chronic kidney disease: Secondary | ICD-10-CM | POA: Diagnosis not present

## 2019-08-19 DIAGNOSIS — Z992 Dependence on renal dialysis: Secondary | ICD-10-CM | POA: Diagnosis not present

## 2019-08-19 DIAGNOSIS — N2581 Secondary hyperparathyroidism of renal origin: Secondary | ICD-10-CM | POA: Diagnosis not present

## 2019-08-19 DIAGNOSIS — D509 Iron deficiency anemia, unspecified: Secondary | ICD-10-CM | POA: Diagnosis not present

## 2019-08-21 DIAGNOSIS — D509 Iron deficiency anemia, unspecified: Secondary | ICD-10-CM | POA: Diagnosis not present

## 2019-08-21 DIAGNOSIS — Z992 Dependence on renal dialysis: Secondary | ICD-10-CM | POA: Diagnosis not present

## 2019-08-21 DIAGNOSIS — E119 Type 2 diabetes mellitus without complications: Secondary | ICD-10-CM | POA: Diagnosis not present

## 2019-08-21 DIAGNOSIS — N186 End stage renal disease: Secondary | ICD-10-CM | POA: Diagnosis not present

## 2019-08-21 DIAGNOSIS — D631 Anemia in chronic kidney disease: Secondary | ICD-10-CM | POA: Diagnosis not present

## 2019-08-21 DIAGNOSIS — N2581 Secondary hyperparathyroidism of renal origin: Secondary | ICD-10-CM | POA: Diagnosis not present

## 2019-08-24 DIAGNOSIS — D509 Iron deficiency anemia, unspecified: Secondary | ICD-10-CM | POA: Diagnosis not present

## 2019-08-24 DIAGNOSIS — E119 Type 2 diabetes mellitus without complications: Secondary | ICD-10-CM | POA: Diagnosis not present

## 2019-08-24 DIAGNOSIS — N2581 Secondary hyperparathyroidism of renal origin: Secondary | ICD-10-CM | POA: Diagnosis not present

## 2019-08-24 DIAGNOSIS — Z992 Dependence on renal dialysis: Secondary | ICD-10-CM | POA: Diagnosis not present

## 2019-08-24 DIAGNOSIS — D631 Anemia in chronic kidney disease: Secondary | ICD-10-CM | POA: Diagnosis not present

## 2019-08-24 DIAGNOSIS — N186 End stage renal disease: Secondary | ICD-10-CM | POA: Diagnosis not present

## 2019-08-26 DIAGNOSIS — N186 End stage renal disease: Secondary | ICD-10-CM | POA: Diagnosis not present

## 2019-08-26 DIAGNOSIS — E119 Type 2 diabetes mellitus without complications: Secondary | ICD-10-CM | POA: Diagnosis not present

## 2019-08-26 DIAGNOSIS — N2581 Secondary hyperparathyroidism of renal origin: Secondary | ICD-10-CM | POA: Diagnosis not present

## 2019-08-26 DIAGNOSIS — Z992 Dependence on renal dialysis: Secondary | ICD-10-CM | POA: Diagnosis not present

## 2019-08-26 DIAGNOSIS — D509 Iron deficiency anemia, unspecified: Secondary | ICD-10-CM | POA: Diagnosis not present

## 2019-08-26 DIAGNOSIS — D631 Anemia in chronic kidney disease: Secondary | ICD-10-CM | POA: Diagnosis not present

## 2019-08-28 DIAGNOSIS — E119 Type 2 diabetes mellitus without complications: Secondary | ICD-10-CM | POA: Diagnosis not present

## 2019-08-28 DIAGNOSIS — D509 Iron deficiency anemia, unspecified: Secondary | ICD-10-CM | POA: Diagnosis not present

## 2019-08-28 DIAGNOSIS — N186 End stage renal disease: Secondary | ICD-10-CM | POA: Diagnosis not present

## 2019-08-28 DIAGNOSIS — Z992 Dependence on renal dialysis: Secondary | ICD-10-CM | POA: Diagnosis not present

## 2019-08-28 DIAGNOSIS — N2581 Secondary hyperparathyroidism of renal origin: Secondary | ICD-10-CM | POA: Diagnosis not present

## 2019-08-28 DIAGNOSIS — D631 Anemia in chronic kidney disease: Secondary | ICD-10-CM | POA: Diagnosis not present

## 2019-08-31 DIAGNOSIS — D631 Anemia in chronic kidney disease: Secondary | ICD-10-CM | POA: Diagnosis not present

## 2019-08-31 DIAGNOSIS — N2581 Secondary hyperparathyroidism of renal origin: Secondary | ICD-10-CM | POA: Diagnosis not present

## 2019-08-31 DIAGNOSIS — N186 End stage renal disease: Secondary | ICD-10-CM | POA: Diagnosis not present

## 2019-08-31 DIAGNOSIS — D509 Iron deficiency anemia, unspecified: Secondary | ICD-10-CM | POA: Diagnosis not present

## 2019-08-31 DIAGNOSIS — E119 Type 2 diabetes mellitus without complications: Secondary | ICD-10-CM | POA: Diagnosis not present

## 2019-08-31 DIAGNOSIS — Z992 Dependence on renal dialysis: Secondary | ICD-10-CM | POA: Diagnosis not present

## 2019-09-02 DIAGNOSIS — N2581 Secondary hyperparathyroidism of renal origin: Secondary | ICD-10-CM | POA: Diagnosis not present

## 2019-09-02 DIAGNOSIS — Z992 Dependence on renal dialysis: Secondary | ICD-10-CM | POA: Diagnosis not present

## 2019-09-02 DIAGNOSIS — D509 Iron deficiency anemia, unspecified: Secondary | ICD-10-CM | POA: Diagnosis not present

## 2019-09-02 DIAGNOSIS — N186 End stage renal disease: Secondary | ICD-10-CM | POA: Diagnosis not present

## 2019-09-02 DIAGNOSIS — E119 Type 2 diabetes mellitus without complications: Secondary | ICD-10-CM | POA: Diagnosis not present

## 2019-09-02 DIAGNOSIS — D631 Anemia in chronic kidney disease: Secondary | ICD-10-CM | POA: Diagnosis not present

## 2019-09-04 DIAGNOSIS — Z992 Dependence on renal dialysis: Secondary | ICD-10-CM | POA: Diagnosis not present

## 2019-09-04 DIAGNOSIS — D631 Anemia in chronic kidney disease: Secondary | ICD-10-CM | POA: Diagnosis not present

## 2019-09-04 DIAGNOSIS — D509 Iron deficiency anemia, unspecified: Secondary | ICD-10-CM | POA: Diagnosis not present

## 2019-09-04 DIAGNOSIS — N186 End stage renal disease: Secondary | ICD-10-CM | POA: Diagnosis not present

## 2019-09-04 DIAGNOSIS — E119 Type 2 diabetes mellitus without complications: Secondary | ICD-10-CM | POA: Diagnosis not present

## 2019-09-04 DIAGNOSIS — N2581 Secondary hyperparathyroidism of renal origin: Secondary | ICD-10-CM | POA: Diagnosis not present

## 2019-09-06 DIAGNOSIS — N2581 Secondary hyperparathyroidism of renal origin: Secondary | ICD-10-CM | POA: Diagnosis not present

## 2019-09-06 DIAGNOSIS — N186 End stage renal disease: Secondary | ICD-10-CM | POA: Diagnosis not present

## 2019-09-06 DIAGNOSIS — Z992 Dependence on renal dialysis: Secondary | ICD-10-CM | POA: Diagnosis not present

## 2019-09-06 DIAGNOSIS — D509 Iron deficiency anemia, unspecified: Secondary | ICD-10-CM | POA: Diagnosis not present

## 2019-09-06 DIAGNOSIS — E119 Type 2 diabetes mellitus without complications: Secondary | ICD-10-CM | POA: Diagnosis not present

## 2019-09-06 DIAGNOSIS — D631 Anemia in chronic kidney disease: Secondary | ICD-10-CM | POA: Diagnosis not present

## 2019-09-08 DIAGNOSIS — N186 End stage renal disease: Secondary | ICD-10-CM | POA: Diagnosis not present

## 2019-09-08 DIAGNOSIS — D509 Iron deficiency anemia, unspecified: Secondary | ICD-10-CM | POA: Diagnosis not present

## 2019-09-08 DIAGNOSIS — D631 Anemia in chronic kidney disease: Secondary | ICD-10-CM | POA: Diagnosis not present

## 2019-09-08 DIAGNOSIS — E119 Type 2 diabetes mellitus without complications: Secondary | ICD-10-CM | POA: Diagnosis not present

## 2019-09-08 DIAGNOSIS — Z992 Dependence on renal dialysis: Secondary | ICD-10-CM | POA: Diagnosis not present

## 2019-09-08 DIAGNOSIS — N2581 Secondary hyperparathyroidism of renal origin: Secondary | ICD-10-CM | POA: Diagnosis not present

## 2019-09-11 DIAGNOSIS — Z992 Dependence on renal dialysis: Secondary | ICD-10-CM | POA: Diagnosis not present

## 2019-09-11 DIAGNOSIS — D631 Anemia in chronic kidney disease: Secondary | ICD-10-CM | POA: Diagnosis not present

## 2019-09-11 DIAGNOSIS — N186 End stage renal disease: Secondary | ICD-10-CM | POA: Diagnosis not present

## 2019-09-11 DIAGNOSIS — N2581 Secondary hyperparathyroidism of renal origin: Secondary | ICD-10-CM | POA: Diagnosis not present

## 2019-09-11 DIAGNOSIS — E119 Type 2 diabetes mellitus without complications: Secondary | ICD-10-CM | POA: Diagnosis not present

## 2019-09-11 DIAGNOSIS — D509 Iron deficiency anemia, unspecified: Secondary | ICD-10-CM | POA: Diagnosis not present

## 2019-09-14 DIAGNOSIS — Z992 Dependence on renal dialysis: Secondary | ICD-10-CM | POA: Diagnosis not present

## 2019-09-14 DIAGNOSIS — N2581 Secondary hyperparathyroidism of renal origin: Secondary | ICD-10-CM | POA: Diagnosis not present

## 2019-09-14 DIAGNOSIS — D631 Anemia in chronic kidney disease: Secondary | ICD-10-CM | POA: Diagnosis not present

## 2019-09-14 DIAGNOSIS — D509 Iron deficiency anemia, unspecified: Secondary | ICD-10-CM | POA: Diagnosis not present

## 2019-09-14 DIAGNOSIS — N186 End stage renal disease: Secondary | ICD-10-CM | POA: Diagnosis not present

## 2019-09-14 DIAGNOSIS — E119 Type 2 diabetes mellitus without complications: Secondary | ICD-10-CM | POA: Diagnosis not present

## 2019-09-21 ENCOUNTER — Encounter: Payer: Self-pay | Admitting: *Deleted

## 2019-09-21 ENCOUNTER — Other Ambulatory Visit: Payer: Self-pay | Admitting: *Deleted

## 2019-09-21 ENCOUNTER — Other Ambulatory Visit: Payer: Self-pay

## 2019-09-21 ENCOUNTER — Ambulatory Visit (INDEPENDENT_AMBULATORY_CARE_PROVIDER_SITE_OTHER): Payer: Medicare Other | Admitting: Surgery

## 2019-09-21 ENCOUNTER — Telehealth: Payer: Self-pay | Admitting: *Deleted

## 2019-09-21 ENCOUNTER — Encounter: Payer: Self-pay | Admitting: Surgery

## 2019-09-21 VITALS — BP 108/55 | HR 66 | Temp 97.7°F | Resp 20 | Ht 69.0 in | Wt 202.0 lb

## 2019-09-21 DIAGNOSIS — Z992 Dependence on renal dialysis: Secondary | ICD-10-CM | POA: Diagnosis not present

## 2019-09-21 DIAGNOSIS — N186 End stage renal disease: Secondary | ICD-10-CM

## 2019-09-21 NOTE — Progress Notes (Signed)
Vascular and Vein Specialist of [facility]  Patient name: Ernest [name] MRN: [id number] DOB: Sep 14, 1946 Sex: male   REASON FOR VISIT:    Follow-up  HISOTRY OF PRESENT ILLNESS:    Ernest [name] is a 73 y.o. male with end-stage renal disease on dialysis via a left upper arm graft.  My most recent records show that this required angioplasty of the subclavian and innominate vein junction in December 2018.  The patient has developed thinning of the skin and eschar over top of his cannulation site and is here for further evaluation.  The patient is on dialysis Monday Wednesday Friday.  He has a history of coronary artery disease, status post CABG in 2018.  He does have hepatitis C.  He has a history of stroke as well as diabetes he takes a statin for hypercholesterolemia.   PAST MEDICAL HISTORY:   Past Medical History:  Diagnosis Date  . Abnormal stress test    s/p cath November 2013 with modest disease involving the ostial left main, proximal LAD, proximal RCA - do not appear to be hemodynamically signficant; mild LV dysfunction  . Bacteremia   . Chronic systolic CHF (congestive heart failure) ([place])   . COPD (chronic obstructive pulmonary disease) ([place])   . Coronary artery disease    a. per cath report 2013, b. 09/13/2017-CABG X4 & Mitral valve repair  . Ejection fraction < 50%   . Erectile dysfunction    penile implant  . ESRD (end stage renal disease) ([place])    Started HD in [place] in 2009, ESRD was due to DM. Moved to [facility] in Dec 2009 and now gets dialysis at [facility] on a MWF schedule.     . Gout    Hx: of  . Hepatitis C   . Hyperlipidemia   . Hypertension   . Mitral valve disease    s/p Mitral repair 09/13/17  . Obesity   . Retinopathy   . Shortness of breath    Hx: of with exertion  . Stroke ([place])   . Type II or unspecified type diabetes mellitus without mention of complication, not stated as uncontrolled    adult onset     FAMILY HISTORY:   Family History  Problem Relation Age of Onset  . Heart disease Father   . Diabetes Sister   . Diabetes Brother   . Diabetes Son     SOCIAL HISTORY:   Social History   Tobacco Use  . Smoking status: Former Smoker    Years: 7.00    Quit date: 06/10/1973    Years since quitting: 46.3  . Smokeless tobacco: Never Used  . Tobacco comment: Quit in 1974.  Substance Use Topics  . Alcohol use: No    Alcohol/week: 0.0 standard drinks     ALLERGIES:   [name]   CURRENT MEDICATIONS:   Current Outpatient Medications  Medication Sig Dispense Refill  . aspirin EC 81 MG tablet Take 1 tablet (81 mg total) by mouth daily. 30 tablet 11  . atorvastatin (LIPITOR) 40 MG tablet Take 40 mg by mouth daily.     . B Complex-C-Folic Acid (NEPHRO-VITE PO) Take 1 tablet by mouth daily.    . B Complex-C-Folic Acid (RENA-VITE RX) 1 MG TABS Take 1 tablet  by mouth daily.    . calcium acetate (PHOSLO) 667 MG capsule Take 667 mg by mouth 3 (three) times daily with meals.     . cinacalcet (SENSIPAR) 30 MG tablet Take 4 tablets (120 mg total) by mouth daily with supper. 60 tablet   . colchicine 0.6 MG tablet colchicine 0.6 mg tablet  Take 1 tablet 6 times a day by oral route for 7 days.    . cyproheptadine (PERIACTIN) 2 MG/5ML syrup cyproheptadine 2 mg/5 mL oral syrup    . Darbepoetin Alfa (ARANESP) 100 MCG/0.5ML SOSY injection Inject 100 mcg into the skin every 7 (seven) days. Administered with dialysis    . diclofenac Sodium (VOLTAREN) 1 % GEL diclofenac 1 % topical gel    . doxercalciferol (HECTOROL) 4 MCG/2ML injection Inject 2 mcg into the vein every Monday, Wednesday, and Friday with hemodialysis. Administered at dialysis    . ferric citrate (AURYXIA) 1 GM 210 MG(Fe) tablet Take 420 mg by mouth 3 (three) times daily with meals.     . ferric gluconate 125 mg in sodium chloride 0.9 % 100 mL Inject 250 mg into the vein every Monday, Wednesday, and Friday with hemodialysis.      [name] insulin detemir (LEVEMIR) 100 UNIT/ML injection Inject 0.2 mLs (20 Units total) into the skin 2 (two) times daily. 10 mL 11  . levETIRAcetam (KEPPRA) 500 MG tablet Take 2 tablets (1,000 mg total) by mouth daily. Then take extra 500 mg (1 tablet) on MWF after HD 204 tablet 3  . linaclotide (LINZESS) 145 MCG CAPS capsule Take 145 mcg by mouth daily.    . Nutritional Supplements (FEEDING SUPPLEMENT, NEPRO CARB STEADY,) LIQD Take 237 mLs by mouth 2 (two) times daily between meals.  0  . sennosides-docusate sodium (SENOKOT-S) 8.6-50 MG tablet Take 2 tablets by mouth 2 (two) times daily.    . sertraline (ZOLOFT) 25 MG tablet Take 25 mg by mouth at bedtime.    . metoprolol tartrate (LOPRESSOR) 50 MG tablet Take 1 tablet (50 mg total) by mouth 2 (two) times daily. 180 tablet 3   No current facility-administered medications for this visit.     REVIEW OF SYSTEMS:   [X]  denotes positive finding, [ ]  denotes negative finding Cardiac  Comments:  Chest pain or chest pressure:    Shortness of breath upon exertion:    Short of breath when lying flat:    Irregular heart rhythm:        Vascular    Pain in calf, thigh, or hip brought on by ambulation:    Pain in feet at night that wakes you up from your sleep:     Blood clot in your veins:    Leg swelling:         Pulmonary    Oxygen at home:    Productive cough:     Wheezing:         Neurologic    Sudden weakness in arms or legs:     Sudden numbness in arms or legs:     Sudden onset of difficulty speaking or slurred speech:    Temporary loss of vision in one eye:     Problems with dizziness:         Gastrointestinal    Blood in stool:     Vomited blood:         Genitourinary    Burning when urinating:     Blood in urine:        Psychiatric  Major depression:         Hematologic    Bleeding problems:    Problems with blood clotting too easily:        Skin    Rashes or ulcers:        Constitutional    Fever or chills:       PHYSICAL EXAM:   Vitals:   09/21/19 1255  BP: (!) 108/55  Pulse: 66  Resp: 20  Temp: 97.7 F (36.5 C)  SpO2: 97%  Weight: 202 lb (91.6 kg)  Height: 5\' 9"  (1.753 m)    GENERAL: The patient is a well-nourished male, in no acute distress. The vital signs are documented above. CARDIAC: There is a regular rate and rhythm.  VASCULAR: Palpable thrill within left upper arm graft with 2 areas of degeneration and eschar. PULMONARY: Non-labored respirations MUSCULOSKELETAL: There are no major deformities or cyanosis. NEUROLOGIC: No focal weakness or paresthesias are detected. SKIN: There are no ulcers or rashes noted. PSYCHIATRIC: The patient has a normal affect.  STUDIES:   None  MEDICAL ISSUES:   Degenerating left upper arm graft: The patient has 2 areas of eschar that are at risk for bleeding.  I think his graft needs to be revised.  I would like to begin with a shuntogram.  I will need to evaluate his central stenosis prior to proceeding.  I would then consider placing Viabahn's over top of the pseudoaneurysms which correspond with the ulcers versus placing essentially a new graft around the ulcerated area.  If I do the latter, he may require a catheter because this will leave much room to cannulate the old graft.  This is been scheduled for this Thursday, December 10    Leia Alf, MD, FACS [facility] of [facility] [phone] Pager [phone]

## 2019-09-21 NOTE — Telephone Encounter (Signed)
Pre-op instruction letter faxed to [facility] at [place]. Left message with Baker Janus at Pre-op that patient instructed to come in 3 hours pre-op for same day nasal swab.

## 2019-09-21 NOTE — Progress Notes (Signed)
Call to [facility]. Informed that patient is scheduled for surgery on 09/24/2019 for HD access revision and poss. TDC.

## 2019-09-21 NOTE — H&P (View-Only) (Signed)
[facility] and [facility]in Specialist of [facility]  Patient name: Ernest [name] MRN: [id number] DOB: 1946-09-24 Sex: male   REASON FOR VISIT:    Follow-up  HISOTRY OF PRESENT ILLNESS:    Ernest [name] is a 73 y.o. male with end-stage renal disease on dialysis via a left upper arm graft.  My most recent records show that this required angioplasty of the subclavian and innominate vein junction in December 2018.  The patient has developed thinning of the skin and eschar over top of his cannulation site and is here for further evaluation.  The patient is on dialysis Monday Wednesday Friday.  He has a history of coronary artery disease, status post CABG in 2018.  He does have hepatitis C.  He has a history of stroke as well as diabetes he takes a statin for hypercholesterolemia.   PAST MEDICAL HISTORY:   Past Medical History:  Diagnosis Date  . Abnormal stress test    s/p cath November 2013 with modest disease involving the ostial left main, proximal LAD, proximal RCA - do not appear to be hemodynamically signficant; mild LV dysfunction  . Bacteremia   . Chronic systolic CHF (congestive heart failure) ([place])   . COPD (chronic obstructive pulmonary disease) ([place])   . Coronary artery disease    a. per cath report 2013, b. 09/13/2017-CABG X4 & Mitral valve repair  . Ejection fraction < 50%   . Erectile dysfunction    penile implant  . ESRD (end stage renal disease) ([place])    Started HD in [place] in 2009, ESRD was due to DM. Moved to [facility] in Dec 2009 and now gets dialysis at [facility] on a MWF schedule.     . Gout    Hx: of  . Hepatitis C   . Hyperlipidemia   . Hypertension   . Mitral valve disease    s/p Mitral repair 09/13/17  . Obesity   . Retinopathy   . Shortness of breath    Hx: of with exertion  . Stroke ([place])   . Type II or unspecified type diabetes mellitus without mention of complication, not stated as uncontrolled    adult onset     FAMILY HISTORY:   Family History  Problem Relation Age of Onset  . Heart disease Father   . Diabetes Sister   . Diabetes Brother   . Diabetes Son     SOCIAL HISTORY:   Social History   Tobacco Use  . Smoking status: Former Smoker    Years: 7.00    Quit date: 06/10/1973    Years since quitting: 46.3  . Smokeless tobacco: Never Used  . Tobacco comment: Quit in 1974.  Substance Use Topics  . Alcohol use: No    Alcohol/week: 0.0 standard drinks     ALLERGIES:   [name]   CURRENT MEDICATIONS:   Current Outpatient Medications  Medication Sig Dispense Refill  . aspirin EC 81 MG tablet Take 1 tablet (81 mg total) by mouth daily. 30 tablet 11  . atorvastatin (LIPITOR) 40 MG tablet Take 40 mg by mouth daily.     . B Complex-C-Folic Acid (NEPHRO-VITE PO) Take 1 tablet by mouth daily.    . B Complex-C-Folic Acid (RENA-VITE RX) 1 MG TABS Take 1 tablet  by mouth daily.    . calcium acetate (PHOSLO) 667 MG capsule Take 667 mg by mouth 3 (three) times daily with meals.     . cinacalcet (SENSIPAR) 30 MG tablet Take 4 tablets (120 mg total) by mouth daily with supper. 60 tablet   . colchicine 0.6 MG tablet colchicine 0.6 mg tablet  Take 1 tablet 6 times a day by oral route for 7 days.    . cyproheptadine (PERIACTIN) 2 MG/5ML syrup cyproheptadine 2 mg/5 mL oral syrup    . Darbepoetin Alfa (ARANESP) 100 MCG/0.5ML SOSY injection Inject 100 mcg into the skin every 7 (seven) days. Administered with dialysis    . diclofenac Sodium (VOLTAREN) 1 % GEL diclofenac 1 % topical gel    . doxercalciferol (HECTOROL) 4 MCG/2ML injection Inject 2 mcg into the vein every Monday, Wednesday, and Friday with hemodialysis. Administered at dialysis    . ferric citrate (AURYXIA) 1 GM 210 MG(Fe) tablet Take 420 mg by mouth 3 (three) times daily with meals.     . ferric gluconate 125 mg in sodium chloride 0.9 % 100 mL Inject 250 mg into the vein every Monday, Wednesday, and Friday with hemodialysis.      [name] insulin detemir (LEVEMIR) 100 UNIT/ML injection Inject 0.2 mLs (20 Units total) into the skin 2 (two) times daily. 10 mL 11  . levETIRAcetam (KEPPRA) 500 MG tablet Take 2 tablets (1,000 mg total) by mouth daily. Then take extra 500 mg (1 tablet) on MWF after HD 204 tablet 3  . linaclotide (LINZESS) 145 MCG CAPS capsule Take 145 mcg by mouth daily.    . Nutritional Supplements (FEEDING SUPPLEMENT, NEPRO CARB STEADY,) LIQD Take 237 mLs by mouth 2 (two) times daily between meals.  0  . sennosides-docusate sodium (SENOKOT-S) 8.6-50 MG tablet Take 2 tablets by mouth 2 (two) times daily.    . sertraline (ZOLOFT) 25 MG tablet Take 25 mg by mouth at bedtime.    . metoprolol tartrate (LOPRESSOR) 50 MG tablet Take 1 tablet (50 mg total) by mouth 2 (two) times daily. 180 tablet 3   No current facility-administered medications for this visit.     REVIEW OF SYSTEMS:   [X]  denotes positive finding, [ ]  denotes negative finding Cardiac  Comments:  Chest pain or chest pressure:    Shortness of breath upon exertion:    Short of breath when lying flat:    Irregular heart rhythm:        Vascular    Pain in calf, thigh, or hip brought on by ambulation:    Pain in feet at night that wakes you up from your sleep:     Blood clot in your veins:    Leg swelling:         Pulmonary    Oxygen at home:    Productive cough:     Wheezing:         Neurologic    Sudden weakness in arms or legs:     Sudden numbness in arms or legs:     Sudden onset of difficulty speaking or slurred speech:    Temporary loss of vision in one eye:     Problems with dizziness:         Gastrointestinal    Blood in stool:     Vomited blood:         Genitourinary    Burning when urinating:     Blood in urine:        Psychiatric  Major depression:         Hematologic    Bleeding problems:    Problems with blood clotting too easily:        Skin    Rashes or ulcers:        Constitutional    Fever or chills:       PHYSICAL EXAM:   Vitals:   09/21/19 1255  BP: (!) 108/55  Pulse: 66  Resp: 20  Temp: 97.7 F (36.5 C)  SpO2: 97%  Weight: 202 lb (91.6 kg)  Height: 5\' 9"  (1.753 m)    GENERAL: The patient is a well-nourished male, in no acute distress. The vital signs are documented above. CARDIAC: There is a regular rate and rhythm.  VASCULAR: Palpable thrill within left upper arm graft with 2 areas of degeneration and eschar. PULMONARY: Non-labored respirations MUSCULOSKELETAL: There are no major deformities or cyanosis. NEUROLOGIC: No focal weakness or paresthesias are detected. SKIN: There are no ulcers or rashes noted. PSYCHIATRIC: The patient has a normal affect.  STUDIES:   None  MEDICAL ISSUES:   Degenerating left upper arm graft: The patient has 2 areas of eschar that are at risk for bleeding.  I think his graft needs to be revised.  I would like to begin with a shuntogram.  I will need to evaluate his central stenosis prior to proceeding.  I would then consider placing Viabahn's over top of the pseudoaneurysms which correspond with the ulcers versus placing essentially a new graft around the ulcerated area.  If I do the latter, he may require a catheter because this will leave much room to cannulate the old graft.  This is been scheduled for this Thursday, December 10    Leia Alf, MD, FACS [facility] Specialists of [facility] [phone] Pager [phone]

## 2019-09-22 ENCOUNTER — Other Ambulatory Visit: Payer: Self-pay

## 2019-09-22 ENCOUNTER — Encounter (HOSPITAL_COMMUNITY): Payer: Self-pay | Admitting: *Deleted

## 2019-09-22 NOTE — Progress Notes (Signed)
Spoke with pt's wife, [name] for pre-op call. DPR on file. Pt has hx of CABG in 2018, Dr. Irish Lack is his cardiologist. Mrs. [place] states pt has not had any recent chest pain. Pt is a type 2 diabetic. Last A1C per the dialysis center was 8.3 on 08/12/19. Mrs. Rosenstock states pt's fasting blood sugar is usually between 90-115. Instructed Mrs Beacham to have pt take 1/2 of his regular dose of Levemir the morning of surgery, will take 10 units. Instructed her to have pt check his blood sugar when he gets up Thursday AM. If blood sugar is 70 or below, treat with 1/2 cup of clear juice (apple or cranberry) and recheck blood sugar 15 minutes after drinking juice.   Pt will be arriving via the [facility] bus, but Mrs. Gaede will be picking him up after surgery. Please call her when he's ready. Pt to get his Covid test done on arrival Thursday AM.

## 2019-09-24 ENCOUNTER — Ambulatory Visit (HOSPITAL_COMMUNITY): Payer: Medicare Other

## 2019-09-24 ENCOUNTER — Ambulatory Visit (HOSPITAL_COMMUNITY)
Admission: RE | Admit: 2019-09-24 | Discharge: 2019-09-24 | Disposition: A | Discharge status: Home / Self Care | Payer: Medicare Other | Attending: Surgery | Admitting: Surgery

## 2019-09-24 ENCOUNTER — Ambulatory Visit: Payer: Medicare Other | Admitting: Interventional Cardiology

## 2019-09-24 ENCOUNTER — Encounter (HOSPITAL_COMMUNITY)
Admission: RE | Disposition: A | Discharge status: Home / Self Care | Payer: Self-pay | Source: Home / Self Care | Attending: Surgery

## 2019-09-24 ENCOUNTER — Ambulatory Visit (HOSPITAL_COMMUNITY): Payer: Medicare Other | Admitting: Anesthesiology

## 2019-09-24 ENCOUNTER — Other Ambulatory Visit: Payer: Self-pay

## 2019-09-24 DIAGNOSIS — Z8673 Personal history of transient ischemic attack (TIA), and cerebral infarction without residual deficits: Secondary | ICD-10-CM | POA: Diagnosis not present

## 2019-09-24 DIAGNOSIS — I132 Hypertensive heart and chronic kidney disease with heart failure and with stage 5 chronic kidney disease, or end stage renal disease: Secondary | ICD-10-CM | POA: Insufficient documentation

## 2019-09-24 DIAGNOSIS — Z992 Dependence on renal dialysis: Secondary | ICD-10-CM | POA: Diagnosis not present

## 2019-09-24 DIAGNOSIS — M109 Gout, unspecified: Secondary | ICD-10-CM | POA: Insufficient documentation

## 2019-09-24 DIAGNOSIS — Z7982 Long term (current) use of aspirin: Secondary | ICD-10-CM | POA: Insufficient documentation

## 2019-09-24 DIAGNOSIS — Z8249 Family history of ischemic heart disease and other diseases of the circulatory system: Secondary | ICD-10-CM | POA: Diagnosis not present

## 2019-09-24 DIAGNOSIS — Z794 Long term (current) use of insulin: Secondary | ICD-10-CM | POA: Insufficient documentation

## 2019-09-24 DIAGNOSIS — Z20828 Contact with and (suspected) exposure to other viral communicable diseases: Secondary | ICD-10-CM | POA: Insufficient documentation

## 2019-09-24 DIAGNOSIS — E1122 Type 2 diabetes mellitus with diabetic chronic kidney disease: Secondary | ICD-10-CM | POA: Insufficient documentation

## 2019-09-24 DIAGNOSIS — T82510A Breakdown (mechanical) of surgically created arteriovenous fistula, initial encounter: Secondary | ICD-10-CM | POA: Insufficient documentation

## 2019-09-24 DIAGNOSIS — I2510 Atherosclerotic heart disease of native coronary artery without angina pectoris: Secondary | ICD-10-CM | POA: Diagnosis not present

## 2019-09-24 DIAGNOSIS — I5022 Chronic systolic (congestive) heart failure: Secondary | ICD-10-CM | POA: Diagnosis not present

## 2019-09-24 DIAGNOSIS — E785 Hyperlipidemia, unspecified: Secondary | ICD-10-CM | POA: Insufficient documentation

## 2019-09-24 DIAGNOSIS — B1920 Unspecified viral hepatitis C without hepatic coma: Secondary | ICD-10-CM | POA: Insufficient documentation

## 2019-09-24 DIAGNOSIS — Z87891 Personal history of nicotine dependence: Secondary | ICD-10-CM | POA: Diagnosis not present

## 2019-09-24 DIAGNOSIS — I059 Rheumatic mitral valve disease, unspecified: Secondary | ICD-10-CM | POA: Diagnosis not present

## 2019-09-24 DIAGNOSIS — Z79899 Other long term (current) drug therapy: Secondary | ICD-10-CM | POA: Diagnosis not present

## 2019-09-24 DIAGNOSIS — N186 End stage renal disease: Secondary | ICD-10-CM | POA: Insufficient documentation

## 2019-09-24 DIAGNOSIS — Z791 Long term (current) use of non-steroidal anti-inflammatories (NSAID): Secondary | ICD-10-CM | POA: Insufficient documentation

## 2019-09-24 DIAGNOSIS — Y841 Kidney dialysis as the cause of abnormal reaction of the patient, or of later complication, without mention of misadventure at the time of the procedure: Secondary | ICD-10-CM | POA: Diagnosis not present

## 2019-09-24 DIAGNOSIS — Z951 Presence of aortocoronary bypass graft: Secondary | ICD-10-CM | POA: Diagnosis not present

## 2019-09-24 DIAGNOSIS — T82898A Other specified complication of vascular prosthetic devices, implants and grafts, initial encounter: Secondary | ICD-10-CM

## 2019-09-24 DIAGNOSIS — J449 Chronic obstructive pulmonary disease, unspecified: Secondary | ICD-10-CM | POA: Diagnosis not present

## 2019-09-24 DIAGNOSIS — E7800 Pure hypercholesterolemia, unspecified: Secondary | ICD-10-CM | POA: Diagnosis not present

## 2019-09-24 DIAGNOSIS — E669 Obesity, unspecified: Secondary | ICD-10-CM | POA: Insufficient documentation

## 2019-09-24 DIAGNOSIS — Z6829 Body mass index (BMI) 29.0-29.9, adult: Secondary | ICD-10-CM | POA: Diagnosis not present

## 2019-09-24 HISTORY — DX: Depression, unspecified: F32.A

## 2019-09-24 HISTORY — PX: FISTULOGRAM: SHX5832

## 2019-09-24 HISTORY — PX: ULTRASOUND GUIDANCE FOR VASCULAR ACCESS: SHX6516

## 2019-09-24 HISTORY — PX: REVISION OF ARTERIOVENOUS GORETEX GRAFT: SHX6073

## 2019-09-24 HISTORY — PX: ENDOVASCULAR STENT INSERTION: SHX5161

## 2019-09-24 HISTORY — DX: Anemia, unspecified: D64.9

## 2019-09-24 HISTORY — DX: Anxiety disorder, unspecified: F41.9

## 2019-09-24 HISTORY — PX: ANGIOPLASTY: SHX39

## 2019-09-24 HISTORY — DX: Unspecified osteoarthritis, unspecified site: M19.90

## 2019-09-24 HISTORY — DX: Personal history of other specified conditions: Z87.898

## 2019-09-24 LAB — POCT I-STAT, CHEM 8
BUN: 30 mg/dL — ABNORMAL HIGH (ref 8–23)
Calcium, Ion: 1.13 mmol/L — ABNORMAL LOW (ref 1.15–1.40)
Chloride: 97 mmol/L — ABNORMAL LOW (ref 98–111)
Creatinine, Ser: 4.4 mg/dL — ABNORMAL HIGH (ref 0.61–1.24)
Glucose, Bld: 349 mg/dL — ABNORMAL HIGH (ref 70–99)
HCT: 38 % — ABNORMAL LOW (ref 39.0–52.0)
Hemoglobin: 12.9 g/dL — ABNORMAL LOW (ref 13.0–17.0)
Potassium: 4.7 mmol/L (ref 3.5–5.1)
Sodium: 136 mmol/L (ref 135–145)
TCO2: 32 mmol/L (ref 22–32)

## 2019-09-24 LAB — GLUCOSE, CAPILLARY
Glucose-Capillary: 286 mg/dL — ABNORMAL HIGH (ref 70–99)
Glucose-Capillary: 277 mg/dL — ABNORMAL HIGH (ref 70–99)
Glucose-Capillary: 252 mg/dL — ABNORMAL HIGH (ref 70–99)
Glucose-Capillary: 116 mg/dL — ABNORMAL HIGH (ref 70–99)
Glucose-Capillary: 68 mg/dL — ABNORMAL LOW (ref 70–99)
Glucose-Capillary: 104 mg/dL — ABNORMAL HIGH (ref 70–99)
Glucose-Capillary: 133 mg/dL — ABNORMAL HIGH (ref 70–99)
Glucose-Capillary: 79 mg/dL (ref 70–99)
Glucose-Capillary: 73 mg/dL (ref 70–99)

## 2019-09-24 LAB — RESPIRATORY PANEL BY RT PCR (FLU A&B, COVID)
Influenza A by PCR: NEGATIVE
Influenza B by PCR: NEGATIVE
SARS Coronavirus 2 by RT PCR: NEGATIVE

## 2019-09-24 SURGERY — REVISION OF ARTERIOVENOUS GORETEX GRAFT
Anesthesia: General

## 2019-09-24 MED ORDER — SODIUM CHLORIDE 0.9 % IV SOLN: INTRAVENOUS | Status: DC

## 2019-09-24 MED ORDER — PHENYLEPHRINE 40 MCG/ML (10ML) SYRINGE FOR IV PUSH (FOR BLOOD PRESSURE SUPPORT)
PREFILLED_SYRINGE | INTRAVENOUS | Status: DC | PRN
  Administered 2019-09-24: 80 ug via INTRAVENOUS
  Administered 2019-09-24 (×2): 120 ug via INTRAVENOUS
  Administered 2019-09-24: 80 ug via INTRAVENOUS

## 2019-09-24 MED ORDER — ONDANSETRON HCL 4 MG/2ML IJ SOLN
INTRAMUSCULAR | Status: DC | PRN
  Administered 2019-09-24: 4 mg via INTRAVENOUS

## 2019-09-24 MED ORDER — PROPOFOL 10 MG/ML IV BOLUS
INTRAVENOUS | Status: AC
  Filled 2019-09-24: qty 20

## 2019-09-24 MED ORDER — SODIUM CHLORIDE 0.9 % IV SOLN
INTRAVENOUS | Status: DC | PRN
  Administered 2019-09-24: 500 mL

## 2019-09-24 MED ORDER — SODIUM CHLORIDE 0.9 % IV SOLN
INTRAVENOUS | Status: AC
  Filled 2019-09-24: qty 1.2

## 2019-09-24 MED ORDER — INSULIN REGULAR(HUMAN) IN NACL 100-0.9 UT/100ML-% IV SOLN
INTRAVENOUS | Status: DC
  Administered 2019-09-24: 10 [IU]/h via INTRAVENOUS
  Filled 2019-09-24: qty 100

## 2019-09-24 MED ORDER — DEXTROSE 50 % IV SOLN
INTRAVENOUS | Status: AC
  Filled 2019-09-24: qty 50

## 2019-09-24 MED ORDER — CEFAZOLIN SODIUM-DEXTROSE 2-4 GM/100ML-% IV SOLN
2.0000 g | INTRAVENOUS | Status: AC
  Administered 2019-09-24: 2 g via INTRAVENOUS
  Filled 2019-09-24: qty 100

## 2019-09-24 MED ORDER — SUCCINYLCHOLINE CHLORIDE 200 MG/10ML IV SOSY
PREFILLED_SYRINGE | INTRAVENOUS | Status: AC
  Filled 2019-09-24: qty 10

## 2019-09-24 MED ORDER — HEPARIN SODIUM (PORCINE) 1000 UNIT/ML IJ SOLN
INTRAMUSCULAR | Status: AC
  Filled 2019-09-24: qty 1

## 2019-09-24 MED ORDER — IODIXANOL 320 MG/ML IV SOLN
INTRAVENOUS | Status: DC | PRN
  Administered 2019-09-24: 50 mL

## 2019-09-24 MED ORDER — CHLORHEXIDINE GLUCONATE 4 % EX LIQD: 60.0000 mL | Freq: Once | CUTANEOUS | Status: DC

## 2019-09-24 MED ORDER — LIDOCAINE 2% (20 MG/ML) 5 ML SYRINGE
INTRAMUSCULAR | Status: AC
  Filled 2019-09-24: qty 15

## 2019-09-24 MED ORDER — OXYCODONE-ACETAMINOPHEN 5-325 MG PO TABS: 1.0000 | ORAL_TABLET | Freq: Four times a day (QID) | ORAL | 0 refills | Status: DC | PRN

## 2019-09-24 MED ORDER — PROMETHAZINE HCL 25 MG/ML IJ SOLN: 6.2500 mg | INTRAMUSCULAR | Status: DC | PRN

## 2019-09-24 MED ORDER — HEPARIN SODIUM (PORCINE) 1000 UNIT/ML IJ SOLN
INTRAMUSCULAR | Status: DC | PRN
  Administered 2019-09-24: 5000 [IU] via INTRAVENOUS

## 2019-09-24 MED ORDER — FENTANYL CITRATE (PF) 250 MCG/5ML IJ SOLN
INTRAMUSCULAR | Status: AC
  Filled 2019-09-24: qty 5

## 2019-09-24 MED ORDER — PHENYLEPHRINE 40 MCG/ML (10ML) SYRINGE FOR IV PUSH (FOR BLOOD PRESSURE SUPPORT)
PREFILLED_SYRINGE | INTRAVENOUS | Status: AC
  Filled 2019-09-24: qty 20

## 2019-09-24 MED ORDER — 0.9 % SODIUM CHLORIDE (POUR BTL) OPTIME
TOPICAL | Status: DC | PRN
  Administered 2019-09-24: 1000 mL

## 2019-09-24 MED ORDER — PHENYLEPHRINE HCL-NACL 10-0.9 MG/250ML-% IV SOLN
INTRAVENOUS | Status: DC | PRN
  Administered 2019-09-24: 40 ug/min via INTRAVENOUS

## 2019-09-24 MED ORDER — SODIUM CHLORIDE 0.9 % IV SOLN
INTRAVENOUS | Status: DC
  Administered 2019-09-24: 08:00:00 via INTRAVENOUS

## 2019-09-24 MED ORDER — IODIXANOL 320 MG/ML IV SOLN
INTRAVENOUS | Status: DC | PRN
  Administered 2019-09-24: 5 mL

## 2019-09-24 MED ORDER — DEXAMETHASONE SODIUM PHOSPHATE 10 MG/ML IJ SOLN
INTRAMUSCULAR | Status: AC
  Filled 2019-09-24: qty 1

## 2019-09-24 MED ORDER — ALBUMIN HUMAN 5 % IV SOLN
INTRAVENOUS | Status: AC
  Filled 2019-09-24: qty 250

## 2019-09-24 MED ORDER — DEXTROSE-NACL 5-0.45 % IV SOLN: INTRAVENOUS | Status: DC

## 2019-09-24 MED ORDER — DEXTROSE 50 % IV SOLN
0.0000 mL | INTRAVENOUS | Status: DC | PRN
  Administered 2019-09-24: 45 mL via INTRAVENOUS
  Filled 2019-09-24 (×2): qty 50

## 2019-09-24 MED ORDER — CEFAZOLIN SODIUM 1 G IJ SOLR
INTRAMUSCULAR | Status: AC
  Filled 2019-09-24: qty 20

## 2019-09-24 MED ORDER — ONDANSETRON HCL 4 MG/2ML IJ SOLN
INTRAMUSCULAR | Status: AC
  Filled 2019-09-24: qty 4

## 2019-09-24 MED ORDER — LIDOCAINE 2% (20 MG/ML) 5 ML SYRINGE
INTRAMUSCULAR | Status: DC | PRN
  Administered 2019-09-24: 100 mg via INTRAVENOUS

## 2019-09-24 MED ORDER — ALBUMIN HUMAN 5 % IV SOLN
12.5000 g | Freq: Once | INTRAVENOUS | Status: AC
  Administered 2019-09-24: 12.5 g via INTRAVENOUS

## 2019-09-24 MED ORDER — PROPOFOL 10 MG/ML IV BOLUS
INTRAVENOUS | Status: DC | PRN
  Administered 2019-09-24: 140 mg via INTRAVENOUS

## 2019-09-24 MED ORDER — FENTANYL CITRATE (PF) 100 MCG/2ML IJ SOLN: 25.0000 ug | INTRAMUSCULAR | Status: DC | PRN

## 2019-09-24 MED ORDER — FENTANYL CITRATE (PF) 250 MCG/5ML IJ SOLN
INTRAMUSCULAR | Status: DC | PRN
  Administered 2019-09-24: 25 ug via INTRAVENOUS

## 2019-09-24 MED FILL — OXYCODONE-ACETAMINOPHEN 5-3: 5-325 | 2 days supply | Qty: 6 | Fill #0

## 2019-09-24 SURGICAL SUPPLY — 92 items
BAG BANDED W/RUBBER/TAPE 36X54 (MISCELLANEOUS) ×5 IMPLANT
BAG DECANTER FOR FLEXI CONT (MISCELLANEOUS) IMPLANT
BALLN ATLAS 16X40X75 (BALLOONS) ×5
BALLN LUTONIX AV 12X40X75 (BALLOONS) ×5
BALLN MUSTANG 12X60X75 (BALLOONS) ×5
BALLN MUSTANG 7X80X75 (BALLOONS) ×5
BALLOON ATLAS 16X40X75 (BALLOONS) ×3 IMPLANT
BALLOON LUTONIX AV 12X40X75 (BALLOONS) ×3 IMPLANT
BALLOON MUSTANG 12X60X75 (BALLOONS) ×3 IMPLANT
BALLOON MUSTANG 7X80X75 (BALLOONS) ×3 IMPLANT
BIOPATCH RED 1 DISK 7.0 (GAUZE/BANDAGES/DRESSINGS) IMPLANT
BIOPATCH RED 1IN DISK 7.0MM (GAUZE/BANDAGES/DRESSINGS)
CANISTER SUCT 3000ML PPV (MISCELLANEOUS) ×5 IMPLANT
CATH ANGIO 5F BER2 100CM (CATHETERS) ×5 IMPLANT
CATH BEACON 5 .035 65 KMP TIP (CATHETERS) ×5 IMPLANT
CATH PALINDROME RT-P 15FX19CM (CATHETERS) IMPLANT
CATH PALINDROME RT-P 15FX23CM (CATHETERS) IMPLANT
CATH PALINDROME RT-P 15FX28CM (CATHETERS) IMPLANT
CATH PALINDROME RT-P 15FX55CM (CATHETERS) IMPLANT
CHLORAPREP W/TINT 10.5 ML (MISCELLANEOUS) IMPLANT
CLIP VESOCCLUDE MED 6/CT (CLIP) ×5 IMPLANT
CLIP VESOCCLUDE SM WIDE 6/CT (CLIP) ×5 IMPLANT
COVER DOME SNAP 22 D (MISCELLANEOUS) ×5 IMPLANT
COVER PROBE W GEL 5X96 (DRAPES) ×5 IMPLANT
COVER SURGICAL LIGHT HANDLE (MISCELLANEOUS) IMPLANT
COVER WAND RF STERILE (DRAPES) IMPLANT
DERMABOND ADVANCED (GAUZE/BANDAGES/DRESSINGS) ×2
DERMABOND ADVANCED .7 DNX12 (GAUZE/BANDAGES/DRESSINGS) ×3 IMPLANT
DEVICE TORQUE H2O (MISCELLANEOUS) ×5 IMPLANT
DRAPE BRACHIAL (DRAPES) ×5 IMPLANT
DRAPE C-ARM 42X72 X-RAY (DRAPES) IMPLANT
DRAPE CHEST BREAST 15X10 FENES (DRAPES) IMPLANT
DRAPE HALF SHEET 40X57 (DRAPES) ×5 IMPLANT
DRSG TEGADERM 4X4.75 (GAUZE/BANDAGES/DRESSINGS) ×10 IMPLANT
ELECT REM PT RETURN 9FT ADLT (ELECTROSURGICAL) ×5
ELECTRODE REM PT RTRN 9FT ADLT (ELECTROSURGICAL) ×3 IMPLANT
GAUZE 4X4 16PLY RFD (DISPOSABLE) IMPLANT
GEL ULTRASOUND 8.5O AQUASONIC (MISCELLANEOUS) IMPLANT
GLOVE BIO SURGEON STRL SZ 6.5 (GLOVE) ×8 IMPLANT
GLOVE BIO SURGEONS STRL SZ 6.5 (GLOVE) ×2
GLOVE BIOGEL PI IND STRL 6.5 (GLOVE) ×12 IMPLANT
GLOVE BIOGEL PI IND STRL 7.5 (GLOVE) ×3 IMPLANT
GLOVE BIOGEL PI IND STRL 8 (GLOVE) IMPLANT
GLOVE BIOGEL PI INDICATOR 6.5 (GLOVE) ×8
GLOVE BIOGEL PI INDICATOR 7.5 (GLOVE) ×2
GLOVE BIOGEL PI INDICATOR 8 (GLOVE)
GLOVE ECLIPSE 7.5 STRL STRAW (GLOVE) ×5 IMPLANT
GLOVE SURG SS PI 7.5 STRL IVOR (GLOVE) ×5 IMPLANT
GOWN STRL REUS W/ TWL LRG LVL3 (GOWN DISPOSABLE) ×6 IMPLANT
GOWN STRL REUS W/ TWL XL LVL3 (GOWN DISPOSABLE) ×9 IMPLANT
GOWN STRL REUS W/TWL LRG LVL3 (GOWN DISPOSABLE) ×4
GOWN STRL REUS W/TWL XL LVL3 (GOWN DISPOSABLE) ×6
GUIDEWIRE ANGLED .035X150CM (WIRE) ×5 IMPLANT
HEMOSTAT SNOW SURGICEL 2X4 (HEMOSTASIS) IMPLANT
KIT BASIN OR (CUSTOM PROCEDURE TRAY) ×5 IMPLANT
KIT ENCORE 26 ADVANTAGE (KITS) ×5 IMPLANT
KIT TURNOVER KIT B (KITS) ×5 IMPLANT
NEEDLE 18GX1X1/2 (RX/OR ONLY) (NEEDLE) IMPLANT
NEEDLE HYPO 25GX1X1/2 BEV (NEEDLE) IMPLANT
NEEDLE PERC 18GX7CM (NEEDLE) IMPLANT
NS IRRIG 1000ML POUR BTL (IV SOLUTION) ×5 IMPLANT
PACK CV ACCESS (CUSTOM PROCEDURE TRAY) ×5 IMPLANT
PACK ENDO MINOR (CUSTOM PROCEDURE TRAY) IMPLANT
PACK SURGICAL SETUP 50X90 (CUSTOM PROCEDURE TRAY) IMPLANT
PAD ARMBOARD 7.5X6 YLW CONV (MISCELLANEOUS) ×10 IMPLANT
SET MICROPUNCTURE 5F STIFF (MISCELLANEOUS) ×5 IMPLANT
SHEATH BRITE TIP 7FRX11 (SHEATH) ×10 IMPLANT
SHEATH PINNACLE 5F 10CM (SHEATH) ×5 IMPLANT
SHEATH PINNACLE R/O II 6F 4CM (SHEATH) ×5 IMPLANT
SOAP 2 % CHG 4 OZ (WOUND CARE) ×5 IMPLANT
STENT VIABAHN 8X100X120 (Permanent Stent) ×2 IMPLANT
STENT VIABAHN 8X10X120 (Permanent Stent) ×3 IMPLANT
STENT VIABAHN 8X50X120 (Permanent Stent) ×5 IMPLANT
STENT VIABAHN5X120X8X (Permanent Stent) ×3 IMPLANT
STOPCOCK MORSE 400PSI 3WAY (MISCELLANEOUS) ×5 IMPLANT
SUT ETHILON 3 0 PS 1 (SUTURE) IMPLANT
SUT PROLENE 6 0 BV (SUTURE) IMPLANT
SUT VIC AB 3-0 SH 27 (SUTURE) ×2
SUT VIC AB 3-0 SH 27X BRD (SUTURE) ×3 IMPLANT
SUT VICRYL 4-0 PS2 18IN ABS (SUTURE) ×5 IMPLANT
SYR 10ML LL (SYRINGE) ×15 IMPLANT
SYR 20ML LL LF (SYRINGE) ×5 IMPLANT
SYR 5ML LL (SYRINGE) IMPLANT
SYR CONTROL 10ML LL (SYRINGE) IMPLANT
TOWEL GREEN STERILE (TOWEL DISPOSABLE) ×5 IMPLANT
TOWEL GREEN STERILE FF (TOWEL DISPOSABLE) ×5 IMPLANT
TUBING CIL FLEX 10 FLL-RA (TUBING) IMPLANT
UNDERPAD 30X30 (UNDERPADS AND DIAPERS) ×5 IMPLANT
WATER STERILE IRR 1000ML POUR (IV SOLUTION) ×5 IMPLANT
WIRE BENTSON .035X145CM (WIRE) ×5 IMPLANT
WIRE ROSEN-J .035X260CM (WIRE) ×5 IMPLANT
WIRE SPARTACORE .014X190CM (WIRE) ×5 IMPLANT

## 2019-09-24 NOTE — Op Note (Signed)
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  I was unable to get across the occlusion from the arm and so groin access was obtained.  I was able to cross the occlusion and perform balloon angioplasty and recanalize this area.  Residual stenosis was approximately 15%.  I did not want to place a new graft with a known central venous occlusion because I thought long-term patency would not be very good and so I elected to place covered stents on the aneurysmal segments of his graft.  This should enable his wounds to heal without concerns for rupture.  Indications: The patient came to the office with a degenerating left upper arm dialysis graft.  He is scheduled for revision today.  Procedure:  The patient was identified in the holding area and taken to [place] [address]  The patient was then placed supine on the table. general anesthesia was administered.  The patient was prepped and draped in the usual sterile fashion.  A time out was called and antibiotics were administered.  Ultrasound was used to evaluate the dialysis graft.  It was patent.  It was cannulated with a micropuncture needle.  An 018 wire was inserted and a micropuncture sheath was placed.  Shuntogram was then performed.  This revealed 2 aneurysmal segments that corresponded with the skin breakdown on the graft.  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   All the contrast went into the internal jugular vein.  I did not see any central venous reconstitution.  I then placed a 7 French sheath in the arm and used a Berenstein catheter and Glidewire to try to cross the occlusion without success.  At this point I elected to gain femoral access.  The right femoral vein was cannulated under ultrasound guidance with a micropuncture needle.  A micropuncture sheath was then placed.  Over a Bentson wire, a 7 French sheath was inserted the patient was given 5000 units of heparin using a Berenstein cath and a Glidewire, I was able to cross the occlusion from the groin.  The catheter was advanced into the subclavian vein.  I then removed the Glidewire and placed a Rosen wire.  I then performed balloon angioplasty using a 12 mm Mustang balloon.  Follow-up imaging revealed patency of the vein however it was still narrowed about 50%.  I then upsized to a 16 x 40 Atlas balloon and repeated balloon angioplasty of the innominate vein.  This time residual stenosis was approximately 15%.  I did not want to place a new graft in a catheter in this gentleman with it being based off a previously occluded central vein therefore I felt I could stent the aneurysmal segments of the graft which were associated with his skin breakdown which will protect him from rupture of these areas and give these wounds a chance to heal.  I did this from the brachial access.  An 8 x 108 x 50 viabahn overlapping stents were placed and molded  with a 7 mm balloon.  Completion imaging showed that the fistula was widely patent and the aneurysmal segments were excluded.  The sheath in the graft was removed by placing a U stitch 4-0 Monocryl.  Manual pressure was held in the groin for sheath removal.  There were no complications.  The patient was extubated and taken to recovery in stable condition.   Disposition: To PACU stable.   Theotis Burrow, M.D., [facility] Specialists of  [place] Office: [phone] Pager:  [phone]

## 2019-09-24 NOTE — Anesthesia Postprocedure Evaluation (Signed)
Anesthesia Post Note  Patient: Ernest [name]  Procedure(s) Performed: REVISION OF ARTERIOVENOUS GORETEX GRAFT LEFT ARM (Left ) SHUNTOGRAM OF ARTERIOVENOUS FISTULA LEFT ARM, (Left ) Angioplasty Innominate Vien (Left ) Endovascular Stent Graft Insertion, Arm Graft (Left ) Ultrasound Guidance For Vascular Access, Right Femoral Vein and Left Arm Graft (Bilateral )     Patient location during evaluation: PACU Anesthesia Type: General Level of consciousness: sedated Pain management: pain level controlled Vital Signs Assessment: post-procedure vital signs reviewed and stable Respiratory status: spontaneous breathing and respiratory function stable Cardiovascular status: stable Postop Assessment: no apparent nausea or vomiting Anesthetic complications: no    Last Vitals:  Vitals:   09/24/19 1251 09/24/19 1306  BP: (!) 99/52 (!) 103/56  Pulse: 70 70  Resp: 10 10  Temp: 36.7 C   SpO2: 99% 98%    Last Pain:  Vitals:   09/24/19 1251  TempSrc:   PainSc: 0-No pain                 Zanyah Lentsch DANIEL

## 2019-09-24 NOTE — OR Nursing (Signed)
Visipaque dosage changed from 100 ml vial to 5 ml for a total of 55 ml for operative procedure Amie Portland, RN

## 2019-09-24 NOTE — Anesthesia Preprocedure Evaluation (Addendum)
Anesthesia Evaluation  Patient identified by MRN, date of birth, ID band Patient awake    Reviewed: Allergy & Precautions, NPO status , Patient's Chart, lab work & pertinent test results  History of Anesthesia Complications Negative for: history of anesthetic complications  Airway Mallampati: II  TM Distance: >3 FB Neck ROM: Full    Dental  (+) Edentulous Upper, Dental Advisory Given   Pulmonary sleep apnea , former smoker,    Pulmonary exam normal        Cardiovascular hypertension, + CAD, + Past MI, + CABG and +CHF  Normal cardiovascular exam+ Valvular Problems/Murmurs   EF 30-35% Study Conclusions  - Left ventricle: Inferobasal and posterior lateral hypokinesis EF   similar to echo from 2016. The cavity size was normal. Wall   thickness was normal. Systolic function was mildly reduced. The   estimated ejection fraction was in the range of 45% to 50%. - Mitral valve: Calcified annulus. Mildly thickened leaflets .   There was mild regurgitation. - Left atrium: The atrium was moderately dilated. - Pulmonary arteries: PA peak pressure: 50 mm Hg (S).    Neuro/Psych Anxiety Depression Dementia CVA    GI/Hepatic negative GI ROS, (+) Hepatitis -  Endo/Other  negative endocrine ROSdiabetes  Renal/GU ESRFRenal disease     Musculoskeletal negative musculoskeletal ROS (+)   Abdominal   Peds  Hematology negative hematology ROS (+)   Anesthesia Other Findings Day of surgery medications reviewed with the patient.  Reproductive/Obstetrics                            Anesthesia Physical Anesthesia Plan  ASA: III  Anesthesia Plan: General   Post-op Pain Management:    Induction: Intravenous  PONV Risk Score and Plan: 2 and Ondansetron and Dexamethasone  Airway Management Planned: LMA  Additional Equipment:   Intra-op Plan:   Post-operative Plan: Extubation in OR  Informed Consent:  I have reviewed the patients History and Physical, chart, labs and discussed the procedure including the risks, benefits and alternatives for the proposed anesthesia with the patient or authorized representative who has indicated his/her understanding and acceptance.     Dental advisory given  Plan Discussed with: CRNA and Anesthesiologist  Anesthesia Plan Comments:        Anesthesia Quick Evaluation

## 2019-09-24 NOTE — Transfer of Care (Signed)
Immediate Anesthesia Transfer of Care Note  Patient: Ernest [name]  Procedure(s) Performed: REVISION OF ARTERIOVENOUS GORETEX GRAFT LEFT ARM (Left ) SHUNTOGRAM OF ARTERIOVENOUS FISTULA LEFT ARM, (Left ) Angioplasty Innominate Vien (Left ) Endovascular Stent Graft Insertion, Arm Graft (Left ) Ultrasound Guidance For Vascular Access, Right Femoral Vein and Left Arm Graft (Bilateral )  Patient Location: PACU  Anesthesia Type:General  Level of Consciousness: drowsy and patient cooperative  Airway & Oxygen Therapy: Patient Spontanous Breathing  Post-op Assessment: Report given to RN and Post -op Vital signs reviewed and stable  Post vital signs: Reviewed and stable  Last Vitals:  Vitals Value Taken Time  BP 125/63 09/24/19 1151  Temp    Pulse 72 09/24/19 1152  Resp 10 09/24/19 1152  SpO2 100 % 09/24/19 1152  Vitals shown include unvalidated device data.  Last Pain:  Vitals:   09/24/19 0726  TempSrc:   PainSc: 0-No pain         Complications: No apparent anesthesia complications

## 2019-09-24 NOTE — Interval H&P Note (Signed)
History and Physical Interval Note:  09/24/2019 8:58 AM  Ernest [name]  has presented today for surgery, with the diagnosis of END STAGE RENAL DISEASE FOR HEMODIALYSIS ACCESS.  The various methods of treatment have been discussed with the patient and family. After consideration of risks, benefits and other options for treatment, the patient has consented to  Procedure(s): REVISION OF ARTERIOVENOUS GORETEX GRAFT LEFT ARM (Left) SHUNTOGRAM OF ARTERIOVENOUS FISTULA LEFT ARM (Left) POSSIBLE INSERTION OF [place] (N/A) as a surgical intervention.  The patient's history has been reviewed, patient examined, no change in status, stable for surgery.  I have reviewed the patient's chart and labs.  Questions were answered to the patient's satisfaction.     [name]

## 2019-09-24 NOTE — Anesthesia Procedure Notes (Signed)
Procedure Name: LMA Insertion Date/Time: 09/24/2019 9:48 AM Performed by: Janace Litten, CRNA Pre-anesthesia Checklist: Patient identified, Emergency Drugs available, Suction available and Patient being monitored Patient Re-evaluated:Patient Re-evaluated prior to induction Oxygen Delivery Method: Circle System Utilized Preoxygenation: Pre-oxygenation with 100% oxygen Induction Type: IV induction Ventilation: Mask ventilation without difficulty LMA: LMA inserted LMA Size: 4.0 Number of attempts: 1 Placement Confirmation: positive ETCO2 Tube secured with: Tape Dental Injury: Teeth and Oropharynx as per pre-operative assessment

## 2019-09-29 ENCOUNTER — Observation Stay (HOSPITAL_COMMUNITY)
Admission: EM | Admit: 2019-09-29 | Discharge: 2019-09-30 | Disposition: A | Discharge status: Home / Self Care | Payer: Medicare Other | Attending: Student in an Organized Health Care Education/Training Program | Admitting: Student in an Organized Health Care Education/Training Program

## 2019-09-29 ENCOUNTER — Encounter: Payer: Self-pay | Admitting: *Deleted

## 2019-09-29 ENCOUNTER — Other Ambulatory Visit: Payer: Self-pay

## 2019-09-29 DIAGNOSIS — J449 Chronic obstructive pulmonary disease, unspecified: Secondary | ICD-10-CM | POA: Insufficient documentation

## 2019-09-29 DIAGNOSIS — M109 Gout, unspecified: Secondary | ICD-10-CM | POA: Diagnosis not present

## 2019-09-29 DIAGNOSIS — Z992 Dependence on renal dialysis: Secondary | ICD-10-CM | POA: Diagnosis not present

## 2019-09-29 DIAGNOSIS — E785 Hyperlipidemia, unspecified: Secondary | ICD-10-CM | POA: Insufficient documentation

## 2019-09-29 DIAGNOSIS — J9601 Acute respiratory failure with hypoxia: Secondary | ICD-10-CM | POA: Diagnosis not present

## 2019-09-29 DIAGNOSIS — Z79899 Other long term (current) drug therapy: Secondary | ICD-10-CM | POA: Diagnosis not present

## 2019-09-29 DIAGNOSIS — Z794 Long term (current) use of insulin: Secondary | ICD-10-CM | POA: Insufficient documentation

## 2019-09-29 DIAGNOSIS — J90 Pleural effusion, not elsewhere classified: Secondary | ICD-10-CM | POA: Diagnosis not present

## 2019-09-29 DIAGNOSIS — F329 Major depressive disorder, single episode, unspecified: Secondary | ICD-10-CM | POA: Insufficient documentation

## 2019-09-29 DIAGNOSIS — R0602 Shortness of breath: Secondary | ICD-10-CM

## 2019-09-29 DIAGNOSIS — G40909 Epilepsy, unspecified, not intractable, without status epilepticus: Secondary | ICD-10-CM | POA: Insufficient documentation

## 2019-09-29 DIAGNOSIS — E1122 Type 2 diabetes mellitus with diabetic chronic kidney disease: Secondary | ICD-10-CM | POA: Insufficient documentation

## 2019-09-29 DIAGNOSIS — N186 End stage renal disease: Secondary | ICD-10-CM | POA: Diagnosis present

## 2019-09-29 DIAGNOSIS — K5909 Other constipation: Secondary | ICD-10-CM | POA: Diagnosis not present

## 2019-09-29 DIAGNOSIS — I5043 Acute on chronic combined systolic (congestive) and diastolic (congestive) heart failure: Secondary | ICD-10-CM | POA: Diagnosis not present

## 2019-09-29 DIAGNOSIS — K5900 Constipation, unspecified: Secondary | ICD-10-CM | POA: Insufficient documentation

## 2019-09-29 DIAGNOSIS — I2510 Atherosclerotic heart disease of native coronary artery without angina pectoris: Secondary | ICD-10-CM | POA: Insufficient documentation

## 2019-09-29 DIAGNOSIS — Z20828 Contact with and (suspected) exposure to other viral communicable diseases: Secondary | ICD-10-CM | POA: Diagnosis not present

## 2019-09-29 DIAGNOSIS — I132 Hypertensive heart and chronic kidney disease with heart failure and with stage 5 chronic kidney disease, or end stage renal disease: Secondary | ICD-10-CM | POA: Diagnosis not present

## 2019-09-29 DIAGNOSIS — E669 Obesity, unspecified: Secondary | ICD-10-CM | POA: Diagnosis not present

## 2019-09-29 DIAGNOSIS — I4892 Unspecified atrial flutter: Secondary | ICD-10-CM | POA: Diagnosis not present

## 2019-09-29 DIAGNOSIS — Z7982 Long term (current) use of aspirin: Secondary | ICD-10-CM | POA: Insufficient documentation

## 2019-09-29 DIAGNOSIS — Z8673 Personal history of transient ischemic attack (TIA), and cerebral infarction without residual deficits: Secondary | ICD-10-CM | POA: Diagnosis not present

## 2019-09-29 DIAGNOSIS — I5022 Chronic systolic (congestive) heart failure: Secondary | ICD-10-CM | POA: Diagnosis present

## 2019-09-29 DIAGNOSIS — Z951 Presence of aortocoronary bypass graft: Secondary | ICD-10-CM | POA: Insufficient documentation

## 2019-09-29 DIAGNOSIS — I5042 Chronic combined systolic (congestive) and diastolic (congestive) heart failure: Secondary | ICD-10-CM | POA: Diagnosis present

## 2019-09-29 LAB — COMPREHENSIVE METABOLIC PANEL
ALT: 7 U/L (ref 0–44)
AST: 17 U/L (ref 15–41)
Albumin: 3 g/dL — ABNORMAL LOW (ref 3.5–5.0)
Alkaline Phosphatase: 92 U/L (ref 38–126)
Anion gap: 15 (ref 5–15)
BUN: 25 mg/dL — ABNORMAL HIGH (ref 8–23)
CO2: 26 mmol/L (ref 22–32)
Calcium: 10.1 mg/dL (ref 8.9–10.3)
Chloride: 97 mmol/L — ABNORMAL LOW (ref 98–111)
Creatinine, Ser: 5.78 mg/dL — ABNORMAL HIGH (ref 0.61–1.24)
GFR calc Af Amer: 10 mL/min — ABNORMAL LOW (ref >60)
GFR calc non Af Amer: 9 mL/min — ABNORMAL LOW (ref >60)
Glucose, Bld: 244 mg/dL — ABNORMAL HIGH (ref 70–99)
Potassium: 4.5 mmol/L (ref 3.5–5.1)
Sodium: 138 mmol/L (ref 135–145)
Total Bilirubin: 0.8 mg/dL (ref 0.3–1.2)
Total Protein: 7 g/dL (ref 6.5–8.1)

## 2019-09-29 LAB — CBC
HCT: 37.4 % — ABNORMAL LOW (ref 39.0–52.0)
Hemoglobin: 12 g/dL — ABNORMAL LOW (ref 13.0–17.0)
MCH: 27.4 pg (ref 26.0–34.0)
MCHC: 32.1 g/dL (ref 30.0–36.0)
MCV: 85.4 fL (ref 80.0–100.0)
Platelets: 200 10*3/uL (ref 150–400)
RBC: 4.38 MIL/uL (ref 4.22–5.81)
RDW: 18.7 % — ABNORMAL HIGH (ref 11.5–15.5)
WBC: 8.1 10*3/uL (ref 4.0–10.5)
nRBC: 0 % (ref 0.0–0.2)

## 2019-09-29 LAB — LIPASE, BLOOD: Lipase: 29 U/L (ref 11–51)

## 2019-09-29 NOTE — ED Notes (Signed)
pearlie wife call when he's discharge [phone] also call with an update pt has a hard time speaking

## 2019-09-29 NOTE — ED Triage Notes (Signed)
Pt from home with ems for c.o constipation, pt has not had a BM in 2 days. Took 1/2 bottle of mineral oil at home without relief. Pt a.o, nad noted

## 2019-09-30 ENCOUNTER — Emergency Department (HOSPITAL_COMMUNITY): Payer: Medicare Other

## 2019-09-30 DIAGNOSIS — J449 Chronic obstructive pulmonary disease, unspecified: Secondary | ICD-10-CM

## 2019-09-30 DIAGNOSIS — Z992 Dependence on renal dialysis: Secondary | ICD-10-CM

## 2019-09-30 DIAGNOSIS — J90 Pleural effusion, not elsewhere classified: Secondary | ICD-10-CM | POA: Diagnosis not present

## 2019-09-30 DIAGNOSIS — E1122 Type 2 diabetes mellitus with diabetic chronic kidney disease: Secondary | ICD-10-CM

## 2019-09-30 DIAGNOSIS — N186 End stage renal disease: Secondary | ICD-10-CM

## 2019-09-30 DIAGNOSIS — I5022 Chronic systolic (congestive) heart failure: Secondary | ICD-10-CM

## 2019-09-30 DIAGNOSIS — Z951 Presence of aortocoronary bypass graft: Secondary | ICD-10-CM

## 2019-09-30 DIAGNOSIS — Z79899 Other long term (current) drug therapy: Secondary | ICD-10-CM

## 2019-09-30 DIAGNOSIS — Z794 Long term (current) use of insulin: Secondary | ICD-10-CM

## 2019-09-30 DIAGNOSIS — Z952 Presence of prosthetic heart valve: Secondary | ICD-10-CM

## 2019-09-30 DIAGNOSIS — J9601 Acute respiratory failure with hypoxia: Secondary | ICD-10-CM | POA: Diagnosis present

## 2019-09-30 DIAGNOSIS — I5042 Chronic combined systolic (congestive) and diastolic (congestive) heart failure: Secondary | ICD-10-CM

## 2019-09-30 DIAGNOSIS — K5909 Other constipation: Secondary | ICD-10-CM

## 2019-09-30 DIAGNOSIS — Z7982 Long term (current) use of aspirin: Secondary | ICD-10-CM

## 2019-09-30 DIAGNOSIS — J948 Other specified pleural conditions: Secondary | ICD-10-CM | POA: Diagnosis not present

## 2019-09-30 DIAGNOSIS — I132 Hypertensive heart and chronic kidney disease with heart failure and with stage 5 chronic kidney disease, or end stage renal disease: Secondary | ICD-10-CM

## 2019-09-30 DIAGNOSIS — I2510 Atherosclerotic heart disease of native coronary artery without angina pectoris: Secondary | ICD-10-CM

## 2019-09-30 DIAGNOSIS — G40909 Epilepsy, unspecified, not intractable, without status epilepticus: Secondary | ICD-10-CM

## 2019-09-30 HISTORY — PX: IR THORACENTESIS ASP PLEURAL SPACE W/IMG GUIDE: IMG5380

## 2019-09-30 LAB — CBG MONITORING, ED: Glucose-Capillary: 153 mg/dL — ABNORMAL HIGH (ref 70–99)

## 2019-09-30 LAB — PROTEIN, PLEURAL OR PERITONEAL FLUID: Total protein, fluid: 4.2 g/dL

## 2019-09-30 LAB — POCT I-STAT EG7
Acid-Base Excess: 5 mmol/L — ABNORMAL HIGH (ref 0.0–2.0)
Bicarbonate: 30.9 mmol/L — ABNORMAL HIGH (ref 20.0–28.0)
Calcium, Ion: 1.23 mmol/L (ref 1.15–1.40)
HCT: 37 % — ABNORMAL LOW (ref 39.0–52.0)
Hemoglobin: 12.6 g/dL — ABNORMAL LOW (ref 13.0–17.0)
O2 Saturation: 86 %
Potassium: 5 mmol/L (ref 3.5–5.1)
Sodium: 135 mmol/L (ref 135–145)
TCO2: 32 mmol/L (ref 22–32)
pCO2, Ven: 51.8 mmHg (ref 44.0–60.0)
pH, Ven: 7.384 (ref 7.250–7.430)
pO2, Ven: 54 mmHg — ABNORMAL HIGH (ref 32.0–45.0)

## 2019-09-30 LAB — ALBUMIN, PLEURAL OR PERITONEAL FLUID: Albumin, Fluid: 1.8 g/dL

## 2019-09-30 LAB — GRAM STAIN

## 2019-09-30 LAB — AMYLASE, PLEURAL OR PERITONEAL FLUID: Amylase, Fluid: 44 U/L

## 2019-09-30 LAB — SARS CORONAVIRUS 2 (TAT 6-24 HRS): SARS Coronavirus 2: NEGATIVE

## 2019-09-30 LAB — LACTATE DEHYDROGENASE: LDH: 120 U/L (ref 98–192)

## 2019-09-30 LAB — GLUCOSE, PLEURAL OR PERITONEAL FLUID: Glucose, Fluid: 135 mg/dL

## 2019-09-30 LAB — LACTATE DEHYDROGENASE, PLEURAL OR PERITONEAL FLUID: LD, Fluid: 133 U/L — ABNORMAL HIGH (ref 3–23)

## 2019-09-30 LAB — BODY FLUID CELL COUNT WITH DIFFERENTIAL
Eos, Fluid: 2 %
Lymphs, Fluid: 63 %
Monocyte-Macrophage-Serous Fluid: 1 % — ABNORMAL LOW (ref 50–90)
Neutrophil Count, Fluid: 34 % — ABNORMAL HIGH (ref 0–25)
Total Nucleated Cell Count, Fluid: 185 cu mm (ref 0–1000)

## 2019-09-30 LAB — POC SARS CORONAVIRUS 2 AG -  ED: SARS Coronavirus 2 Ag: NEGATIVE

## 2019-09-30 MED ORDER — DOXERCALCIFEROL 4 MCG/2ML IV SOLN: 2.0000 ug | INTRAVENOUS | Status: DC

## 2019-09-30 MED ORDER — INSULIN DETEMIR 100 UNIT/ML ~~LOC~~ SOLN: 20.0000 [IU] | Freq: Every day | SUBCUTANEOUS | Status: DC

## 2019-09-30 MED ORDER — ATORVASTATIN CALCIUM 40 MG PO TABS: 40.0000 mg | ORAL_TABLET | Freq: Every day | ORAL | Status: DC

## 2019-09-30 MED ORDER — SERTRALINE HCL 25 MG PO TABS: 25.0000 mg | ORAL_TABLET | Freq: Every day | ORAL | Status: DC

## 2019-09-30 MED ORDER — CINACALCET HCL 30 MG PO TABS: 120.0000 mg | ORAL_TABLET | Freq: Every day | ORAL | Status: DC

## 2019-09-30 MED ORDER — ACETAMINOPHEN 325 MG PO TABS: 650.0000 mg | ORAL_TABLET | Freq: Four times a day (QID) | ORAL | Status: DC | PRN

## 2019-09-30 MED ORDER — HEPARIN SODIUM (PORCINE) 5000 UNIT/ML IJ SOLN: 5000.0000 [IU] | Freq: Three times a day (TID) | INTRAMUSCULAR | Status: DC

## 2019-09-30 MED ORDER — METOPROLOL TARTRATE 25 MG PO TABS: 50.0000 mg | ORAL_TABLET | Freq: Two times a day (BID) | ORAL | Status: DC

## 2019-09-30 MED ORDER — LINACLOTIDE 145 MCG PO CAPS: 145.0000 ug | ORAL_CAPSULE | Freq: Every day | ORAL | Status: DC

## 2019-09-30 MED ORDER — ACETAMINOPHEN 650 MG RE SUPP: 650.0000 mg | Freq: Four times a day (QID) | RECTAL | Status: DC | PRN

## 2019-09-30 MED ORDER — ONDANSETRON HCL 4 MG PO TABS: 4.0000 mg | ORAL_TABLET | Freq: Four times a day (QID) | ORAL | Status: DC | PRN

## 2019-09-30 MED ORDER — LIDOCAINE HCL (PF) 1 % IJ SOLN
INTRAMUSCULAR | Status: AC | PRN
  Administered 2019-09-30: 10 mL

## 2019-09-30 MED ORDER — FERRIC CITRATE 1 GM 210 MG(FE) PO TABS: 420.0000 mg | ORAL_TABLET | Freq: Three times a day (TID) | ORAL | Status: DC

## 2019-09-30 MED ORDER — ONDANSETRON HCL 4 MG/2ML IJ SOLN: 4.0000 mg | Freq: Four times a day (QID) | INTRAMUSCULAR | Status: DC | PRN

## 2019-09-30 MED ORDER — LEVETIRACETAM 500 MG PO TABS: 1000.0000 mg | ORAL_TABLET | Freq: Every day | ORAL | Status: DC

## 2019-09-30 MED ORDER — CALCIUM ACETATE 667 MG PO CAPS: 667.0000 mg | ORAL_CAPSULE | Freq: Three times a day (TID) | ORAL | Status: DC

## 2019-09-30 MED ORDER — LIDOCAINE HCL 1 % IJ SOLN
INTRAMUSCULAR | Status: AC
  Filled 2019-09-30: qty 20

## 2019-09-30 MED ORDER — SENNA-DOCUSATE SODIUM 8.6-50 MG PO TABS: 2.0000 | ORAL_TABLET | Freq: Two times a day (BID) | ORAL | Status: DC

## 2019-09-30 NOTE — H&P (Signed)
Date: 09/30/2019               Patient Name:  Ernest [name] MRN: [id number]  DOB: [birth year] Age / Sex: 73 y.o., male   PCP: Patient, [name]         Medical Service: Internal Medicine Teaching Service         Attending Physician: Dr. Evette Doffing, Mallie Mussel, *    First Contact: Dr. Darrick Meigs Pager: [phone]  Second Contact: Dr. Myrtie Hawk Pager: [phone]       After Hours (After 5p/  First Contact Pager: [phone]  weekends / holidays): Second Contact Pager: [phone]   Chief Complaint: Constipation  History of Present Illness: Ernest [name] is a 73 y.o male with CAD s/p 4 vessel CABG and Mitral valve repair in 2018, ESRD on HD, DM, and chronic constipation who presented to the emergency department with complaints of abdominal pain. History was obtained via the patient and through chart review.  Patient states that yesterday he developed abdominal pain after not having a bowel movement for approximately two days. He took his home laxatives including half a bottle of mineral oil without relief. This brought him to the emergency department for further evaluation. He states that he deals with chronic constipation and this is not unusual for him. He is supposed to be on daily laxatives but has not been taking them. In the emergency department abdominal imaging was obtained that illustrated moderate to large colonic stool burden and progression of left pleural effusion has been noted since 2018. CT of the chest without contrast was obtained and showed a large locket later left pleural effusion with rind and dependent debris. IR was consulted for thoracentesis and labs for further analysis were sent off. At which point we were consulted for admission.  States that other than the abdominal pain and constipation he has been in his normal state of health. He acknowledges that the pleural effusion has been present since his CABG in 2018 but states that he is never had the fluid removed. He is not  had any episodes of pneumonia or been treated for an infection since 2018. He denies fevers, chills, headaches, myalgias, arthralgias, new rash, chest pain, shortness of breath, orthopnea, PND, worsening abdominal distention, early satiety, worsening lower extremity edema, weight loss, night sweats. He has been on dialysis since 1985 and is consistent with dialysis. He is a never smoker.   Meds: No current facility-administered medications on file prior to encounter.   Current Outpatient Medications on File Prior to Encounter  Medication Sig Dispense Refill  . aspirin EC 81 MG tablet Take 1 tablet (81 mg total) by mouth daily. 30 tablet 11  . atorvastatin (LIPITOR) 40 MG tablet Take 40 mg by mouth daily.     . B Complex-C-Folic Acid (NEPHRO-VITE PO) Take 1 tablet by mouth daily.    . B Complex-C-Folic Acid (RENA-VITE RX) 1 MG TABS Take 1 tablet by mouth daily.    . calcium acetate (PHOSLO) 667 MG capsule Take 667 mg by mouth 3 (three) times daily with meals.     . cinacalcet (SENSIPAR) 30 MG tablet Take 4 tablets (120 mg total) by mouth daily with supper. 60 tablet   . colchicine 0.6 MG tablet colchicine 0.6 mg tablet  Take 1 tablet 6 times a day by oral route for 7 days.    . cyproheptadine (PERIACTIN) 2 MG/5ML syrup cyproheptadine 2 mg/5 mL oral syrup    . Darbepoetin Alfa (ARANESP)  100 MCG/0.5ML SOSY injection Inject 100 mcg into the skin every 7 (seven) days. Administered with dialysis    . diclofenac Sodium (VOLTAREN) 1 % GEL diclofenac 1 % topical gel    . doxercalciferol (HECTOROL) 4 MCG/2ML injection Inject 2 mcg into the vein every Monday, Wednesday, and Friday with hemodialysis. Administered at dialysis    . ferric citrate (AURYXIA) 1 GM 210 MG(Fe) tablet Take 420 mg by mouth 3 (three) times daily with meals.     . ferric gluconate 125 mg in sodium chloride 0.9 % 100 mL Inject 250 mg into the vein every Monday, Wednesday, and Friday with hemodialysis.     [name] insulin detemir (LEVEMIR)  100 UNIT/ML injection Inject 0.2 mLs (20 Units total) into the skin 2 (two) times daily. 10 mL 11  . levETIRAcetam (KEPPRA) 500 MG tablet Take 2 tablets (1,000 mg total) by mouth daily. Then take extra 500 mg (1 tablet) on MWF after HD 204 tablet 3  . linaclotide (LINZESS) 145 MCG CAPS capsule Take 145 mcg by mouth daily.    . metoprolol tartrate (LOPRESSOR) 50 MG tablet Take 1 tablet (50 mg total) by mouth 2 (two) times daily. 180 tablet 3  . Nutritional Supplements (FEEDING SUPPLEMENT, NEPRO CARB STEADY,) LIQD Take 237 mLs by mouth 2 (two) times daily between meals.  0  . oxyCODONE-acetaminophen (PERCOCET/ROXICET) 5-325 MG tablet Take 1 tablet by mouth every 6 (six) hours as needed. 8 tablet 0  . oxyCODONE-acetaminophen (PERCOCET/ROXICET) 5-325 MG tablet Take 1 tablet by mouth every 6 (six) hours as needed. 6 tablet 0  . sennosides-docusate sodium (SENOKOT-S) 8.6-50 MG tablet Take 2 tablets by mouth 2 (two) times daily.    . sertraline (ZOLOFT) 25 MG tablet Take 25 mg by mouth at bedtime.     Allergies: Allergies as of 09/29/2019  . ([name])   Past Medical History:  Diagnosis Date  . Abnormal stress test    s/p cath November 2013 with modest disease involving the ostial left main, proximal LAD, proximal RCA - do not appear to be hemodynamically signficant; mild LV dysfunction  . Anemia    low iron  . Anxiety   . Arthritis   . Bacteremia   . Chronic systolic CHF (congestive heart failure) ([place])   . COPD (chronic obstructive pulmonary disease) ([place])   . Coronary artery disease    a. per cath report 2013, b. 09/13/2017-CABG X4 & Mitral valve repair  . Depression   . Ejection fraction < 50%   . Erectile dysfunction    penile implant  . ESRD (end stage renal disease) ([place])    Started HD in [place] in 2009, ESRD was due to DM. Moved to [facility] in Dec 2009 and now gets dialysis at [facility] on a MWF schedule.     . Gout    Hx: of  . Hepatitis C   . History of seizures   .  Hyperlipidemia   . Hypertension   . Mitral valve disease    s/p Mitral repair 09/13/17  . Obesity   . Retinopathy   . Shortness of breath    Hx: of with exertion  . Stroke [facility])    does have weakness  . Type II or unspecified type diabetes mellitus without mention of complication, not stated as uncontrolled    adult onset   Family History: Father had an MRI and hypertension. Oldest son died of complications of diabetes.  Social History: Patient worked for 40 years.  He is married. His wife is a Regulatory affairs officer. Patient had three children. Unfortunately they have all passed. His eldest son died from complications of diabetes. His middle daughter died from complications of bariatric surgery. And his youngest daughter passed recently after being struck by vehicle. He is a never smoker, never drinker, and never used illicit substances.  Review of Systems: A complete ROS was negative except as per HPI.   Physical Exam: Blood pressure (!) 109/54, pulse 62, temperature 98.5 F (36.9 C), temperature source Oral, resp. rate 11, SpO2 93 %.  General: Obese male in no acute distress HENT: Normocephalic, atraumatic, moist mucus membranes Pulm: Good air movement with left basilar crackles, and diminished air movement with dullness to percussion of the right lung base  CV: RRR, no murmurs, no rubs  Abdomen: Active bowel sounds, soft, non-distended, no tenderness to palpation  Extremities: Pulses palpable in all extremities, no LE edema  Skin: Warm and dry  Neuro: Alert and oriented x 3  Assessment & Plan by Problem: Principal Problem:   Pleural effusion on left Active Problems:   ESRD (end stage renal disease) (H[facility])   Chronic combined systolic and diastolic CHF (congestive heart failure) (HCC)   Paroxysmal atrial flutter (HCC)   Acute respiratory failure with hypoxia (HCC)   Pleural effusion  Tajon Prestage is a 73 y.o male with CAD s/p 4 vessel CABG and Mitral valve repair in 2018, ESRD on HD,  DM, and chronic constipation who presented to the emergency department with complaints of abdominal pain. Abdominal x-rays were obtained to evaluate his pain. The films noted moderate to large colonic stool burden and progression of the left pleural effusion that has been noted since 2018. CT of the chest was performed that illustrated a loculated effusion. IR was consulted for thoracentesis and we are consulted for admission for further evaluation/management.  Loculated Left Pleural Effusion - Patient is currently asymptomatic.  - Exudate if by lights criteria with slightly elevated LDH - Cytology has been sent - No infectious symptoms currently to suspect this is a parapneumonic effusion - Rind on imaging speaks to the chronicity of the effusion - Cardiothoracic surgery was consulted by the ED. Did not recommend surgical intervention - Discussed the case with pulmonology. Felt that this was likely secondary to his CABG and because of the chronic nature has formed a rind and loculation. Recommended outpatient follow-up with CT surgery  CAD s/p CABG  Mitral Valve Repair  - Continue ASA and Atorvastatin  - Continue Metoprolol 50 mg BID  Diabetes Mellitus  - Continue Levemir 20 units QD + SSI  Seizure Disorder  - Continue Keppra 1000 mg QD   ESRD on HD MWF - Current euvolemic, electro  Chronic Constipation  - Continue Linaclotide and senokot-S - Will add Miralax  - May need enema   After admission orders replaced the patient shared that his daughter's funeral is tomorrow. We discussed the case with pulmonology and feel that is safe for him to follow-up as an outpatient.  SignedIna Homes, MD 09/30/2019, 4:58 PM  Pager: [phone]

## 2019-09-30 NOTE — ED Notes (Signed)
Pt is sleepy, does arouse easily- denies any pain/difficulty breathing

## 2019-09-30 NOTE — ED Notes (Signed)
Ernest [name] [phone] Wife-- please call for consent

## 2019-09-30 NOTE — ED Notes (Signed)
Pt oxygen at 88% on RA. Placed pt on 2L  with improvement to 93%. Will cont to monitor.

## 2019-09-30 NOTE — ED Notes (Signed)
Pt is sleepy, falls asleep while talking-- thinks it is 2003, unsure of month,

## 2019-09-30 NOTE — Procedures (Signed)
PROCEDURE SUMMARY:  Successful image-guided left thoracentesis. Yielded 650 milliliters of amber fluid. Patient tolerated procedure well for the majority of the procedure, patient became agitated and was thrashing around in bed so procedure was aborted - residual fluid remains on post procedure US examination. EBL: Zero No immediate complications.  Specimen was sent for labs. Post procedure CXR shows no pneumothorax.  Please see imaging section of Epic for full dictation.  Joaquim Nam [name] 09/30/2019 11:20 AM

## 2019-09-30 NOTE — Consult Note (Signed)
NAME:  Ernest [name], MRN:  [id number], DOB:  [birth year], LOS: 0 ADMISSION DATE:  09/29/2019, CONSULTATION DATE:  09/30/19 REFERRING MD:  Marcello Moores - IMTS, CHIEF COMPLAINT:  Constipation   Brief History   73 yo M p[resenting with constipation, incidentally found to have pleural effusion. Thora performed by IR.   PCCM consulted.   History of present illness   73 yo M with extensive medical history including ERSD, CAD s.p CABG x 4, MV repair, pleural effusion (noted 2018), Hep C, Seizure who presented 12/16 with CC constipation after several days without bowel movement at home. Denied SOB, cough, post-nasal drip, orthopnea, DOE, chest pain, increased edema. Of note, patient recently underwent revision of LUE AVG.   Imaging in ED revealed large L sided pleural effusion. CVTS consulted and recommended thoracentesis with IR due to lack of symptoms, stable seeming nature. Following thora, PCCM consulted for recommendations.   Past Medical History  CAD s/p CABG x 4 Mitral Valve repair  Gout Hep C HLD Seizures Pleural Effusion CVA DM 2 HTN CHF COPD ESRD Anemia   Significant Hospital Events   12/16 presented to ED for constipation. Found to have pleural effusion (previously noted in 2018 after CABG). CVTS consulted, recommended IR drainage. PCCM consulted after drainage for recommendations  Consults:  CVTS IR PCCM Procedures:  12/16 L sided thoracentesis with IR  Significant Diagnostic Tests:  12/16 CT chest> Large loculated L pleural effusion with rind, dependent debris.   Micro Data:    Antimicrobials:    Interim history/subjective:  Stable on RA following thoracentesis Denies respiratory symptoms on presentation and denies change/improvement of symptoms following thoracentesis.   States he wants to go home, he reveals that his daughter has recently passed away and her funeral is planned for 12/17.   Objective   Blood pressure (!) 109/54, pulse 62, temperature 98.5  F (36.9 C), temperature source Oral, resp. rate 11, SpO2 93 %.        Intake/Output Summary (Last 24 hours) at 09/30/2019 1707 Last data filed at 09/30/2019 1118 Gross per 24 hour  Intake --  Output 650 ml  Net -650 ml   There were no vitals filed for this visit.  Examination: General: Chronically ill appearing M, NAD supine in stretcher HENT: NCAT. Pink mm. Trachea midline  Lungs: Diminished L sided sounds. Symmetrical chest expansion. No adventitious sounds.  Cardiovascular: RRR s1s2. Midline sternotomy scar is well visualized.  Abdomen: round, soft, mild diffuse tenderness. Hypoactive bowel sounds  Extremities: Symmetrical bulk and tone.  Neuro: Awake. Following commands. Bradyphrenic. GU: defer   Resolved Hospital Problem list     Assessment & Plan:   Pleural Effusion -Initially visualized in 2018 during CABG hospitalization -has been asymptomatic-- underwent thoracentesis in ED today without change in respiratory status (no perceived sx before, no perceived improvement after) -borderline exudative -- likely is related to prior CABG P -Given asymptomatic nature would not favor aggressive therapies and feel this likely be followed up on outpatient -Would recommend follow up with CVTS outpatient for this.  -Patient is HDS, Stable on RA. From pulmonary perspective he is stable for discharge home  Constipation -chronic, pt Rx laxatives but is not compliant P -Would recommend Rx bowel regimen and outpatient follow up  With primary care provider for tailoring of home regimen that patient tolerates well and will be compliant with -Recommend discussing dietary choices   Situational depression -recent death of daughter Chronic Depression -Zoloft P -Consider coordinating discharge with mindfulness of upcoming  funeral  -emotional support, consider consulting chaplain -Close follow up with Provider who prescribes Zoloft/PCP and encourage open/frequent dialogue about mood  in light of recent loss.   Best practice:  Pain/Anxiety/Delirium protocol (if indicated): na VAP protocol (if indicated): na Mobility: walker Code Status: Full  Family Communication: Patient and wife updated  Disposition: Stable for discharge from pulmonary perspective but defer to primary team. May be admitted for observation   Labs   CBC: Recent Labs  Lab 09/24/19 0704 09/29/19 1727 09/30/19 1013  WBC  --  8.1  --   HGB 12.9* 12.0* 12.6*  HCT 38.0* 37.4* 37.0*  MCV  --  85.4  --   PLT  --  200  --     Basic Metabolic Panel: Recent Labs  Lab 09/24/19 0704 09/29/19 1727 09/30/19 1013  NA 136 138 135  K 4.7 4.5 5.0  CL 97* 97*  --   CO2  --  26  --   GLUCOSE 349* 244*  --   BUN 30* 25*  --   CREATININE 4.40* 5.78*  --   CALCIUM  --  10.1  --    GFR: Estimated Creatinine Clearance: 13.1 mL/min (A) (by C-G formula based on SCr of 5.78 mg/dL (H)). Recent Labs  Lab 09/29/19 1727  WBC 8.1    Liver Function Tests: Recent Labs  Lab 09/29/19 1727  AST 17  ALT 7  ALKPHOS 92  BILITOT 0.8  PROT 7.0  ALBUMIN 3.0*   Recent Labs  Lab 09/29/19 1727  LIPASE 29   No results for input(s): AMMONIA in the last 168 hours.  ABG    Component Value Date/Time   PHART 7.427 09/15/2017 0320   PCO2ART 37.5 09/15/2017 0320   PO2ART 59.7 (L) 09/15/2017 0320   HCO3 30.9 (H) 09/30/2019 1013   TCO2 32 09/30/2019 1013   ACIDBASEDEF 4.0 (H) 09/14/2017 0153   O2SAT 86.0 09/30/2019 1013     Coagulation Profile: No results for input(s): INR, PROTIME in the last 168 hours.  Cardiac Enzymes: No results for input(s): CKTOTAL, CKMB, CKMBINDEX, TROPONINI in the last 168 hours.  HbA1C: Hemoglobin A1C  Date/Time Value Ref Range Status  12/11/2017 12:00 AM 6.2  Final   Hgb A1c MFr Bld  Date/Time Value Ref Range Status  09/08/2017 08:10 AM 9.1 (H) 4.8 - 5.6 % Final    Comment:    (NOTE)         Prediabetes: 5.7 - 6.4         Diabetes: >6.4         Glycemic control for  adults with diabetes: <7.0   10/28/2016 04:42 AM 8.7 (H) 4.8 - 5.6 % Final    Comment:    (NOTE)         Pre-diabetes: 5.7 - 6.4         Diabetes: >6.4         Glycemic control for adults with diabetes: <7.0     CBG: Recent Labs  Lab 09/24/19 1136 09/24/19 1152 09/24/19 1216 09/24/19 1248 09/30/19 0848  GLUCAP 133* 104* 79 73 153*    Review of Systems:   Denies cough, SOB, dyspnea, orthopnea, difficulty breathing, wheezing, post-nasal drip  Denies chest pain, BLE edema, radiating arm pain Endorses constipation, mild abdominal pain, denies nausea and vomiting.  Endorses sadness denies SI, HI, endorses tearfulness and difficulty sleeping Denies changes in dietary patterns Denies changes in skin, hair, nails Denies fever, chills, unintentional weight loss, sick contacts  Past Medical History  He,  has a past medical history of Abnormal stress test, Anemia, Anxiety, Arthritis, Bacteremia, Chronic systolic CHF (congestive heart failure) ([place]), COPD (chronic obstructive pulmonary disease) ([facility]), Coronary artery disease, Depression, Ejection fraction < 50%, Erectile dysfunction, ESRD (end stage renal disease) ([place]), Gout, Hepatitis C, History of seizures, Hyperlipidemia, Hypertension, Mitral valve disease, Obesity, Retinopathy, Shortness of breath, Stroke ([place]), and Type II or unspecified type diabetes mellitus without mention of complication, not stated as uncontrolled.   Surgical History    Past Surgical History:  Procedure Laterality Date  . A/V FISTULAGRAM N/A 10/04/2017   Procedure: A/V FISTULAGRAM;  Surgeon: Elam Dutch, MD;  Location: [place] [facility];  Service: Cardiovascular;  Laterality: N/A;  . ANGIOPLASTY Left 09/24/2019   Procedure: Angioplasty Innominate Vien;  Surgeon: Serafina Mitchell, MD;  Location: [place];  Service: Vascular;  Laterality: Left;  . AV FISTULA PLACEMENT Left 01/13/2013   Procedure: INSERTION OF ARTERIOVENOUS (AV) GORE-TEX GRAFT ARM;  Surgeon:  Angelia Mould, MD;  Location: [place];  Service: Vascular;  Laterality: Left;  . AV FISTULA PLACEMENT Left 05/05/2013   Procedure: INSERTION OF LEFT UPPER ARM  ARTERIOVENOUS GORTEX GRAFT;  Surgeon: Angelia Mould, MD;  Location: [place];  Service: Vascular;  Laterality: Left;  . [place] REMOVAL Left 03/26/2013   Procedure: REMOVAL OF  NON INCORPORATED ARTERIOVENOUS GORETEX GRAFT ([place]) left arm * repair of  left brachial artery with vein patch angioplasty.;  Surgeon: Angelia Mould, MD;  Location: [place];  Service: Vascular;  Laterality: Left;  . CARDIAC CATHETERIZATION  August 26, 2012  . CATARACT EXTRACTION W/ INTRAOCULAR LENS  IMPLANT, BILATERAL    . COLONOSCOPY    . CORONARY ARTERY BYPASS GRAFT N/A 09/13/2017   Procedure: CORONARY ARTERY BYPASS GRAFTING (CABG) times four LIMA  to LAD, SVG sequentially to OM and RAMUS Intermediate and SVG to Acute Marginal;  Surgeon: Melrose Nakayama, MD;  Location: [facility];  Service: Open Heart Surgery;  Laterality: N/A;  . EMBOLECTOMY Right 08/27/2013   Procedure: EMBOLECTOMY;  Surgeon: Mal Misty, MD;  Location: [place];  Service: Vascular;  Laterality: Right;  Thrombectomy of Radial and ulnar artery.  . ENDOVASCULAR STENT INSERTION Left 09/24/2019   Procedure: Endovascular Stent Graft Insertion, Arm Graft;  Surgeon: Serafina Mitchell, MD;  Location: [facility];  Service: Vascular;  Laterality: Left;  . EYE SURGERY     laser.  and surgery for DM  . fistula     RUE and wrist  . FISTULOGRAM Left 09/24/2019   Procedure: SHUNTOGRAM OF ARTERIOVENOUS FISTULA LEFT ARM,;  Surgeon: Serafina Mitchell, MD;  Location: [place];  Service: Vascular;  Laterality: Left;  . INSERTION OF DIALYSIS CATHETER Right 01/01/2013   Procedure: INSERTION OF DIALYSIS CATHETER right  internal jugular;  Surgeon: Serafina Mitchell, MD;  Location: [facility];  Service: Vascular;  Laterality: Right;  . INSERTION OF DIALYSIS CATHETER N/A 03/26/2013   Procedure: INSERTION OF DIALYSIS  CATHETER Left internal jugular vein;  Surgeon: Angelia Mould, MD;  Location: [facility];  Service: Vascular;  Laterality: N/A;  . IR THORACENTESIS ASP PLEURAL SPACE W/IMG GUIDE  09/30/2019  . LEFT HEART CATH AND CORONARY ANGIOGRAPHY N/A 09/09/2017   Procedure: LEFT HEART CATH AND CORONARY ANGIOGRAPHY;  Surgeon: Troy Sine, MD;  Location: [facility];  Service: Cardiovascular;  Laterality: N/A;  . LIGATION OF ARTERIOVENOUS  FISTULA Right 12/30/2013   Procedure: REMOVAL OF SEGMENT OF GORTEX GRAFT  AND FISTULA  AND REPAIR OF BRACHIAL ARTERY;  Surgeon: Mal Misty, MD;  Location: [facility];  Service: Vascular;  Laterality: Right;  . MITRAL VALVE REPAIR N/A 09/13/2017   Procedure: MITRAL VALVE REPAIR (MVR);  Surgeon: Melrose Nakayama, MD;  Location: [facility];  Service: Open Heart Surgery;  Laterality: N/A;  . MITRAL VALVE REPAIR (MV)/CORONARY ARTERY BYPASS GRAFTING (CABG)  09/13/2017  . PENILE PROSTHESIS IMPLANT     1997.. no card  . PERIPHERAL VASCULAR BALLOON ANGIOPLASTY Left 10/04/2017   Procedure: PERIPHERAL VASCULAR BALLOON ANGIOPLASTY;  Surgeon: Elam Dutch, MD;  Location: [place] [facility];  Service: Cardiovascular;  Laterality: Left;  AVF  . REVISION OF ARTERIOVENOUS GORETEX GRAFT Left 09/24/2019   Procedure: REVISION OF ARTERIOVENOUS GORETEX GRAFT LEFT ARM;  Surgeon: Serafina Mitchell, MD;  Location: [place];  Service: Vascular;  Laterality: Left;  . TEE WITHOUT CARDIOVERSION N/A 06/11/2013   Procedure: TRANSESOPHAGEAL ECHOCARDIOGRAM (TEE);  Surgeon: Larey Dresser, MD;  Location: [facility];  Service: Cardiovascular;  Laterality: N/A;  . TEE WITHOUT CARDIOVERSION N/A 09/13/2017   Procedure: TRANSESOPHAGEAL ECHOCARDIOGRAM (TEE);  Surgeon: Melrose Nakayama, MD;  Location: [place];  Service: Open Heart Surgery;  Laterality: N/A;  . THROMBECTOMY W/ EMBOLECTOMY Left 03/26/2013   Procedure: Attempted thrombectomy of left arm arteriovenous goretex graft.;  Surgeon: Angelia Mould, MD;  Location: [place];  Service: Vascular;  Laterality: Left;  . ULTRASOUND GUIDANCE FOR VASCULAR ACCESS Bilateral 09/24/2019   Procedure: Ultrasound Guidance For Vascular Access, Right Femoral Vein and Left Arm Graft;  Surgeon: Serafina Mitchell, MD;  Location[facility];  Service: Vascular;  Laterality: Bilateral;  . VENOGRAM N/A 04/07/2013   Procedure: VENOGRAM;  Surgeon: Serafina Mitchell, MD;  Location: [facility] CATH LAB;  Service: Cardiovascular;  Laterality: N/A;     Social History   reports that he quit smoking about 46 years ago. He quit after 7.00 years of use. He has never used smokeless tobacco. He reports that he does not drink alcohol or use drugs.   Family History   His family history includes Diabetes in his brother, sister, and son; Heart disease in his father.   Allergies [name]   Home Medications  Prior to Admission medications   Medication Sig Start Date End Date Taking? Authorizing Provider  aspirin EC 81 MG tablet Take 1 tablet (81 mg total) by mouth daily. 11/29/15   Rivet, Sindy Guadel[place], MD  atorvastatin (LIPITOR) 40 MG tablet Take 40 mg by mouth daily.     [provider]  B Complex-C-Folic Acid (NEPHRO-VITE PO) Take 1 tablet by mouth daily.    [provider]  B Complex-C-Folic Acid (RENA-VITE RX) 1 MG TABS Take 1 tablet by mouth daily. 07/10/19   [provider]  calcium acetate (PHOSLO) 667 MG capsule Take 667 mg by mouth 3 (three) times daily with meals.     [provider]  cinacalcet (SENSIPAR) 30 MG tablet Take 4 tablets (120 mg total) by mouth daily with supper. 10/07/17   Jadene Pierini E, [name]  colchicine 0.6 MG tablet colchicine 0.6 mg tablet  Take 1 tablet 6 times a day by oral route for 7 days.    [provider]  cyproheptadine (PERIACTIN) 2 MG/5ML syrup cyproheptadine 2 mg/5 mL oral syrup    [provider]  Darbepoetin Alfa (ARANESP) 100 MCG/0.5ML SOSY injection Inject 100 mcg into the skin every 7  (seven) days. Administered with dialysis  [provider]  diclofenac Sodium (VOLTAREN) 1 % GEL diclofenac 1 % topical gel    [provider]  doxercalciferol (HECTOROL) 4 MCG/2ML injection Inject 2 mcg into the vein every Monday, Wednesday, and Friday with hemodialysis. Administered at dialysis    [provider]  ferric citrate (AURYXIA) 1 GM 210 MG(Fe) tablet Take 420 mg by mouth 3 (three) times daily with meals.     [provider]  ferric gluconate 125 mg in sodium chloride 0.9 % 100 mL Inject 250 mg into the vein every Monday, Wednesday, and Friday with hemodialysis.     [provider]  insulin detemir (LEVEMIR) 100 UNIT/ML injection Inject 0.2 mLs (20 Units total) into the skin 2 (two) times daily. 10/07/17   Gold, Wilder Glade, [name]  levETIRAcetam (KEPPRA) 500 MG tablet Take 2 tablets (1,000 mg total) by mouth daily. Then take extra 500 mg (1 tablet) on MWF after HD 01/20/19   Pieter Partridge, DO  linaclotide (LINZESS) 145 MCG CAPS capsule Take 145 mcg by mouth daily.    [provider]  metoprolol tartrate (LOPRESSOR) 50 MG tablet Take 1 tablet (50 mg total) by mouth 2 (two) times daily. 03/11/18 09/24/19  Daune Perch, NP  Nutritional Supplements (FEEDING SUPPLEMENT, NEPRO CARB STEADY,) LIQD Take 237 mLs by mouth 2 (two) times daily between meals. 10/07/17   Decari Giovanni, [name]  oxyCODONE-acetaminophen (PERCOCET/ROXICET) 5-325 MG tablet Take 1 tablet by mouth every 6 (six) hours as needed. 09/24/19   Ulyses Amor, [name]  oxyCODONE-acetaminophen (PERCOCET/ROXICET) 5-325 MG tablet Take 1 tablet by mouth every 6 (six) hours as needed. 09/24/19   Ulyses Amor, [name]  sennosides-docusate sodium (SENOKOT-S) 8.6-50 MG tablet Take 2 tablets by mouth 2 (two) times daily.    [provider]  sertraline (ZOLOFT) 25 MG tablet Take 25 mg by mouth at bedtime.    [provider]       Eliseo Gum MSN, AGACNP-BC [place] [id number] If no answer, [id number] 09/30/2019, 5:39 PM

## 2019-09-30 NOTE — Consult Note (Signed)
[address]       ,[place] [address]             [phone]                    Ernest [name] Medical Record [id number] Date of Birth: [birth year]  Referring: No ref. provider found Primary Care: Patient, [name] Primary Cardiologist: Larae Grooms, MD  Chief Complaint:    Chief Complaint  Patient presents with  . Constipation    History of Present Illness:    Ernest [name] 73 y.o. male known to the service as he is status post CABG x4 and mitral valve repair in 2018.  He also has a history of end-stage renal disease on dialysis.  He presented today with a complaint of abdominal pain, and cross-sectional imaging revealed a large left-sided pleural effusion.  Cardiothoracic surgery was consulted to assist with management.  Of note last week, he underwent a revision of his AV graft in his left arm.    Past Medical History:  Diagnosis Date  . Abnormal stress test    s/p cath November 2013 with modest disease involving the ostial left main, proximal LAD, proximal RCA - do not appear to be hemodynamically signficant; mild LV dysfunction  . Anemia    low iron  . Anxiety   . Arthritis   . Bacteremia   . Chronic systolic CHF (congestive heart failure) ([place])   . COPD (chronic obstructive pulmonary disease) ([place])   . Coronary artery disease    a. per cath report 2013, b. 09/13/2017-CABG X4 & Mitral valve repair  . Depression   . Ejection fraction < 50%   . Erectile dysfunction    penile implant  . ESRD (end stage renal disease) ([place])    Started HD in [place] in 2009, ESRD was due to DM. Moved to [facility] in Dec 2009 and now gets dialysis at [facility] on a MWF schedule.     . Gout    Hx: of  . Hepatitis C   . History of seizures   . Hyperlipidemia   . Hypertension   . Mitral valve disease    s/p Mitral repair 09/13/17  . Obesity   . Retinopathy   . Shortness of breath    Hx: of with exertion  . Stroke [facility])    does have weakness    . Type II or unspecified type diabetes mellitus without mention of complication, not stated as uncontrolled    adult onset    Past Surgical History:  Procedure Laterality Date  . A/V FISTULAGRAM N/A 10/04/2017   Procedure: A/V FISTULAGRAM;  Surgeon: Elam Dutch, MD;  Location: [place] CV LAB;  Service: Cardiovascular;  Laterality: N/A;  . ANGIOPLASTY Left 09/24/2019   Procedure: Angioplasty Innominate Vien;  Surgeon: Serafina Mitchell, MD;  Location: [place];  Service: Vascular;  Laterality: Left;  . AV FISTULA PLACEMENT Left 01/13/2013   Procedure: INSERTION OF ARTERIOVENOUS (AV) GORE-TEX GRAFT ARM;  Surgeon: Angelia Mould, MD;  Location: [place];  Service: Vascular;  Laterality: Left;  . AV FISTULA PLACEMENT Left 05/05/2013   Procedure: INSERTION OF LEFT UPPER ARM  ARTERIOVENOUS GORTEX GRAFT;  Surgeon: Angelia Mould, MD;  Location: [place];  Service: Vascular;  Laterality: Left;  . [place] REMOVAL Left 03/26/2013   Procedure: REMOVAL OF  NON INCORPORATED ARTERIOVENOUS GORETEX GRAFT ([place]) left arm * repair of  left brachial artery with vein patch angioplasty.;  Surgeon: Angelia Mould, MD;  Location: [place];  Service: Vascular;  Laterality: Left;  . CARDIAC CATHETERIZATION  August 26, 2012  . CATARACT EXTRACTION W/ INTRAOCULAR LENS  IMPLANT, BILATERAL    . COLONOSCOPY    . CORONARY ARTERY BYPASS GRAFT N/A 09/13/2017   Procedure: CORONARY ARTERY BYPASS GRAFTING (CABG) times four LIMA  to LAD, SVG sequentially to OM and RAMUS Intermediate and SVG to Acute Marginal;  Surgeon: Melrose Nakayama, MD;  Location: [place];  Service: Open Heart Surgery;  Laterality: N/A;  . EMBOLECTOMY Right 08/27/2013   Procedure: EMBOLECTOMY;  Surgeon: Mal Misty, MD;  Location: [place];  Service: Vascular;  Laterality: Right;  Thrombectomy of Radial and ulnar artery.  . ENDOVASCULAR STENT INSERTION Left 09/24/2019   Procedure: Endovascular Stent Graft Insertion, Arm Graft;  Surgeon:  Serafina Mitchell, MD;  Location: [place];  Service: Vascular;  Laterality: Left;  . EYE SURGERY     laser.  and surgery for DM  . fistula     RUE and wrist  . FISTULOGRAM Left 09/24/2019   Procedure: SHUNTOGRAM OF ARTERIOVENOUS FISTULA LEFT ARM,;  Surgeon: Serafina Mitchell, MD;  Location: [place];  Service: Vascular;  Laterality: Left;  . INSERTION OF DIALYSIS CATHETER Right 01/01/2013   Procedure: INSERTION OF DIALYSIS CATHETER right  internal jugular;  Surgeon: Serafina Mitchell, MD;  Location: [place];  Service: Vascular;  Laterality: Right;  . INSERTION OF DIALYSIS CATHETER N/A 03/26/2013   Procedure: INSERTION OF DIALYSIS CATHETER Left internal jugular vein;  Surgeon: Angelia Mould, MD;  Location: [place];  Service: Vascular;  Laterality: N/A;  . IR THORACENTESIS ASP PLEURAL SPACE W/IMG GUIDE  09/30/2019  . LEFT HEART CATH AND CORONARY ANGIOGRAPHY N/A 09/09/2017   Procedure: LEFT HEART CATH AND CORONARY ANGIOGRAPHY;  Surgeon: Troy Sine, MD;  Location: [place] CV [facility];  Service: Cardiovascular;  Laterality: N/A;  . LIGATION OF ARTERIOVENOUS  FISTULA Right 12/30/2013   Procedure: REMOVAL OF SEGMENT OF GORTEX GRAFT AND FISTULA  AND REPAIR OF BRACHIAL ARTERY;  Surgeon: Mal Misty, MD;  Location: [place];  Service: Vascular;  Laterality: Right;  . MITRAL VALVE REPAIR N/A 09/13/2017   Procedure: MITRAL VALVE REPAIR (MVR);  Surgeon: Melrose Nakayama, MD;  Location: [place];  Service: Open Heart Surgery;  Laterality: N/A;  . MITRAL VALVE REPAIR (MV)/CORONARY ARTERY BYPASS GRAFTING (CABG)  09/13/2017  . PENILE PROSTHESIS IMPLANT     1997.. no card  . PERIPHERAL VASCULAR BALLOON ANGIOPLASTY Left 10/04/2017   Procedure: PERIPHERAL VASCULAR BALLOON ANGIOPLASTY;  Surgeon: Elam Dutch, MD;  Location: [place] CV LAB;  Service: Cardiovascular;  Laterality: Left;  AVF  . REVISION OF ARTERIOVENOUS GORETEX GRAFT Left 09/24/2019   Procedure: REVISION OF ARTERIOVENOUS GORETEX GRAFT LEFT  ARM;  Surgeon: Serafina Mitchell, MD;  Location: [place];  Service: Vascular;  Laterality: Left;  . TEE WITHOUT CARDIOVERSION N/A 06/11/2013   Procedure: TRANSESOPHAGEAL ECHOCARDIOGRAM (TEE);  Surgeon: Larey Dresser, MD;  Location: [facility] ENDOSCOPY;  Service: Cardiovascular;  Laterality: N/A;  . TEE WITHOUT CARDIOVERSION N/A 09/13/2017   Procedure: TRANSESOPHAGEAL ECHOCARDIOGRAM (TEE);  Surgeon: Melrose Nakayama, MD;  Location: [place];  Service: Open Heart Surgery;  Laterality: N/A;  . THROMBECTOMY W/ EMBOLECTOMY Left 03/26/2013   Procedure: Attempted thrombectomy of left arm arteriovenous goretex graft.;  Surgeon: Angelia Mould, MD;  Location: [place];  Service: Vascular;  Laterality: Left;  .  ULTRASOUND GUIDANCE FOR VASCULAR ACCESS Bilateral 09/24/2019   Procedure: Ultrasound Guidance For Vascular Access, Right Femoral Vein and Left Arm Graft;  Surgeon: Serafina Mitchell, MD;  Location[facility];  Service: Vascular;  Laterality: Bilateral;  . VENOGRAM N/A 04/07/2013   Procedure: VENOGRAM;  Surgeon: Serafina Mitchell, MD;  Location: [facility] CATH LAB;  Service: Cardiovascular;  Laterality: N/A;    Family History  Problem Relation Age of Onset  . Heart disease Father   . Diabetes Sister   . Diabetes Brother   . Diabetes Son      Social History   Tobacco Use  Smoking Status Former Smoker  . Years: 7.00  . Quit date: 06/10/1973  . Years since quitting: 46.3  Smokeless Tobacco Never Used  Tobacco Comment   Quit in 1974.    Social History   Substance and Sexual Activity  Alcohol Use No  . Alcohol/week: 0.0 standard drinks     [name]  Current Facility-Administered Medications  Medication Dose Route Frequency Provider Last Rate Last Admin  . lidocaine (XYLOCAINE) 1 % (with pres) injection            Current Outpatient Medications  Medication Sig Dispense Refill  . aspirin EC 81 MG tablet Take 1 tablet (81 mg total) by mouth daily. 30 tablet 11  . atorvastatin (LIPITOR) 40  MG tablet Take 40 mg by mouth daily.     . B Complex-C-Folic Acid (NEPHRO-VITE PO) Take 1 tablet by mouth daily.    . B Complex-C-Folic Acid (RENA-VITE RX) 1 MG TABS Take 1 tablet by mouth daily.    . calcium acetate (PHOSLO) 667 MG capsule Take 667 mg by mouth 3 (three) times daily with meals.     . cinacalcet (SENSIPAR) 30 MG tablet Take 4 tablets (120 mg total) by mouth daily with supper. 60 tablet   . colchicine 0.6 MG tablet colchicine 0.6 mg tablet  Take 1 tablet 6 times a day by oral route for 7 days.    . cyproheptadine (PERIACTIN) 2 MG/5ML syrup cyproheptadine 2 mg/5 mL oral syrup    . Darbepoetin Alfa (ARANESP) 100 MCG/0.5ML SOSY injection Inject 100 mcg into the skin every 7 (seven) days. Administered with dialysis    . diclofenac Sodium (VOLTAREN) 1 % GEL diclofenac 1 % topical gel    . doxercalciferol (HECTOROL) 4 MCG/2ML injection Inject 2 mcg into the vein every Monday, Wednesday, and Friday with hemodialysis. Administered at dialysis    . ferric citrate (AURYXIA) 1 GM 210 MG(Fe) tablet Take 420 mg by mouth 3 (three) times daily with meals.     . ferric gluconate 125 mg in sodium chloride 0.9 % 100 mL Inject 250 mg into the vein every Monday, Wednesday, and Friday with hemodialysis.     [name] insulin detemir (LEVEMIR) 100 UNIT/ML injection Inject 0.2 mLs (20 Units total) into the skin 2 (two) times daily. 10 mL 11  . levETIRAcetam (KEPPRA) 500 MG tablet Take 2 tablets (1,000 mg total) by mouth daily. Then take extra 500 mg (1 tablet) on MWF after HD 204 tablet 3  . linaclotide (LINZESS) 145 MCG CAPS capsule Take 145 mcg by mouth daily.    . metoprolol tartrate (LOPRESSOR) 50 MG tablet Take 1 tablet (50 mg total) by mouth 2 (two) times daily. 180 tablet 3  . Nutritional Supplements (FEEDING SUPPLEMENT, NEPRO CARB STEADY,) LIQD Take 237 mLs by mouth 2 (two) times daily between meals.  0  . oxyCODONE-acetaminophen (PERCOCET/ROXICET) 5-325  MG tablet Take 1 tablet by mouth every 6 (six) hours  as needed. 8 tablet 0  . oxyCODONE-acetaminophen (PERCOCET/ROXICET) 5-325 MG tablet Take 1 tablet by mouth every 6 (six) hours as needed. 6 tablet 0  . sennosides-docusate sodium (SENOKOT-S) 8.6-50 MG tablet Take 2 tablets by mouth 2 (two) times daily.    . sertraline (ZOLOFT) 25 MG tablet Take 25 mg by mouth at bedtime.      Review of Systems  Unable to perform ROS: Mental acuity    PHYSICAL EXAMINATION: BP (!) 109/54   Pulse 62   Temp 98.5 F (36.9 C) (Oral)   Resp 11   SpO2 93%   Physical Exam  Constitutional: No distress.  HENT:  Head: Normocephalic and atraumatic.  Pulmonary/Chest: No respiratory distress.  Neurological:  Lethargic  Skin: Skin is warm.     Diagnostic Studies & Laboratory data:     Recent Radiology Findings:   DG Chest 1 View  Result Date: 09/30/2019 CLINICAL DATA:  Shortness of breath following left thoracentesis EXAM: CHEST  1 VIEW COMPARISON:  CT from earlier in the same day. FINDINGS: Cardiac shadow is enlarged. Postsurgical changes are noted. The lungs are hypoinflated. The left lung again demonstrates a pleural effusion predominately laterally near the apex which suggests the possibility of loculation. No pneumothorax is noted. Some underlying consolidation in the left lung is noted related to the effusion. No acute bony abnormality is noted. IMPRESSION: Interval thoracentesis with decrease of left pleural fluid although an apparent loculated component is noted superiorly. No pneumothorax is noted. Electronically Signed   By: Inez Catalina M.D.   On: 09/30/2019 11:34   CT Head Wo Contrast  Result Date: 09/30/2019 CLINICAL DATA:  Altered mental status. EXAM: CT HEAD WITHOUT CONTRAST TECHNIQUE: Contiguous axial images were obtained from the base of the skull through the vertex without intravenous contrast. COMPARISON:  January 04, 2018. FINDINGS: Brain: Mild chronic ischemic white matter disease is noted. No mass effect or midline shift is noted.  Ventricular size is within normal limits. There is no evidence of mass lesion, hemorrhage or acute infarction. Vascular: No hyperdense vessel or unexpected calcification. Skull: Normal. Negative for fracture or focal lesion. Sinuses/Orbits: No acute finding. Other: None. IMPRESSION: Mild chronic ischemic white matter disease. No acute intracranial abnormality seen. Electronically Signed   By: Marijo Conception M.D.   On: 09/30/2019 10:37   CT Chest Wo Contrast  Result Date: 09/30/2019 CLINICAL DATA:  Abnormal x-ray.  Left pleural effusion EXAM: CT CHEST WITHOUT CONTRAST TECHNIQUE: Multidetector CT imaging of the chest was performed following the standard protocol without IV contrast. COMPARISON:  Acute abdominal series today. FINDINGS: Cardiovascular: Borderline heart size. No pericardial effusion. Mitral annuloplasty. Extensive coronary calcification with CABG. No acute vascular finding without contrast. Mediastinum/Nodes: Generous mediastinal lymph nodes that are likely reactive in this setting. No mediastinal collection or inflammatory changes. Densely calcified left pulmonary nodule considered incidental. Right-sided venous stenting in the distal cephalic. Lungs/Pleura: Large left pleural effusion that is loculated laterally, extending from apex to base. The effusion has a rind and there is high-density layering debris/material. A few thin associated pleural calcifications are noted. No right-sided pleural calcification. Most of the left lung is collapsed and poorly evaluated without contrast. Upper Abdomen: No emergent finding. Extensive atherosclerosis. Bilateral renal atrophy and cystic change. Layering high-density in the gallbladder, likely sludge. Colonic diverticulosis. Musculoskeletal: No erosions of the left ribs. No acute or aggressive finding. IMPRESSION: 1. Large loculated left pleural effusion with  rind and dependent debris. Without contrast there is limited evaluation of the left lung which is  primarily collapsed. 2. Aortic Atherosclerosis (ICD10-I70.0). Coronary atherosclerosis with CABG. Electronically Signed   By: Monte Fantasia M.D.   On: 09/30/2019 07:20   Acute Abd Series  Result Date: 09/30/2019 CLINICAL DATA:  Constipation. No bowel movement for 2 days. EXAM: DG ABDOMEN ACUTE W/ 1V CHEST COMPARISON:  Chest radiograph 11/11/2017 FINDINGS: Post median sternotomy with prosthetic valve. Cardiomegaly. Progressive left pleural effusion which is now partially loculated and tracking laterally. Associated compressive atelectasis 3 matches the left lung. Right lung is hypo aerated. Vascular stent in the region of the right subclavian. Left supraclavicular calcification corresponds to calcified thyroid nodule on neck CTA 09/19/2017. Moderate to large colonic stool burden. Stool balls in the distal sigmoid colon. Small volume of rectal stool. No small bowel dilatation to suggest obstruction. No free air. And I will prosthesis partially included. There are vascular calcifications. No acute osseous abnormalities are seen. IMPRESSION: 1. Moderate to large colonic stool burden. No bowel obstruction or free air. 2. Progressive left pleural effusion since 2019, now large in size, partially loculated and tracking laterally. 3. Cardiomegaly. Electronically Signed   By: Keith Rake M.D.   On: 09/30/2019 05:46   HYBRID OR IMAGING ([facility] ONLY)  Result Date: 09/24/2019 There is no interpretation for this exam.  This order is for images obtained during a surgical procedure.  Please See "Surgeries" Tab for more information regarding the procedure.   IR THORACENTESIS ASP PLEURAL SPACE W/IMG GUIDE  Result Date: 09/30/2019 INDICATION: Patient with history of hepatitis-C, CHF, mitral valve disease status post repair 2018 and end-stage renal disease on hemodialysis presented to the ED with complaints of abdominal pain. Imaging shows left loculated effusion with thickened rind. Request to IR for diagnostic and  therapeutic thoracentesis. EXAM: ULTRASOUND GUIDED LEFT THORACENTESIS MEDICATIONS: 8 mL 1% lidocaine COMPLICATIONS: None immediate. PROCEDURE: An ultrasound guided thoracentesis was thoroughly discussed with the patient and questions answered. The benefits, risks, alternatives and complications were also discussed. The patient understands and wishes to proceed with the procedure. Written consent was obtained. Ultrasound was performed to localize and mark an adequate pocket of fluid in the left chest. The area was then prepped and draped in the normal sterile fashion. 1% Lidocaine was used for local anesthesia. Under ultrasound guidance a 6 Fr Safe-T-Centesis catheter was introduced, however thickened rind could not be penetrated. Safe-T-Centesis was removed and 19 gauge 7 cm Yueh was successfully inserted. Thoracentesis was performed. The catheter was removed and a dressing applied. FINDINGS: A total of approximately 650 mL of amber fluid was removed. Samples were sent to the laboratory as requested by the clinical team. IMPRESSION: Successful ultrasound guided left thoracentesis yielding 650 mL of pleural fluid. Read by Candiss Norse, [name] Electronically Signed   By: Jacqulynn Cadet M.D.   On: 09/30/2019 11:38       I have independently reviewed the above radiology studies  and reviewed the findings with the patient.   Recent Lab Findings: Lab Results  Component Value Date   WBC 8.1 09/29/2019   HGB 12.6 (L) 09/30/2019   HCT 37.0 (L) 09/30/2019   PLT 200 09/29/2019   GLUCOSE 244 (H) 09/29/2019   CHOL 116 10/28/2016   TRIG 92 10/28/2016   HDL 48 10/28/2016   LDLCALC 50 10/28/2016   ALT 7 09/29/2019   AST 17 09/29/2019   NA 135 09/30/2019   K 5.0 09/30/2019   CL 97 (  L) 09/29/2019   CREATININE 5.78 (H) 09/29/2019   BUN 25 (H) 09/29/2019   CO2 26 09/29/2019   TSH 1.064 07/12/2015   INR 1.4 (H) 02/04/2018   HGBA1C 6.2 12/11/2017      Assessment / Plan:   73 year old male with a  large left-sided pleural effusion.  Given his history of coronary artery bypass grafting with the use of the LIMA, and history of dialysis graft malfunction, it is possible that this pleural effusion is uremic in nature.  Additionally given his past surgical history and the fact that he is asymptomatic pulmonary standpoint I would recommend image guided drainage with interventional radiology.  No need for surgical drainage at this point.      Lajuana Matte 09/30/2019 4:19 PM

## 2019-09-30 NOTE — ED Notes (Signed)
Patient Alert and oriented to baseline. Stable and ambulatory to baseline. Patient verbalized understanding of the discharge instructions.  Patient belongings were taken by the patient.   

## 2019-09-30 NOTE — ED Notes (Signed)
Pt now on room air with saturation of 95%.

## 2019-09-30 NOTE — ED Notes (Signed)
Transported to IR -- on monitor

## 2019-09-30 NOTE — ED Provider Notes (Signed)
Care assumed from Quincy Carnes, [name], at shift change, please see their notes for full documentation of patient's complaint/HPI. Briefly, pt here with complaint of constipation x 2 days; on sennokot but unsure if taking currently. Results so far show CBC without leukocytosis, CMP with stable potassium. Acute abd series with stool burden as well as left pleural effusion. Awaiting CT chest without contrast. Plan is to dispo accordingly after CT chest.   Physical Exam  BP 138/64   Pulse 68   Temp 98.5 F (36.9 C) (Oral)   Resp 16   SpO2 97%   Physical Exam Vitals and nursing note reviewed.  Constitutional:      Appearance: He is obese. He is not ill-appearing or diaphoretic.  HENT:     Head: Normocephalic and atraumatic.  Eyes:     Conjunctiva/sclera: Conjunctivae normal.  Cardiovascular:     Rate and Rhythm: Normal rate and regular rhythm.     Pulses: Normal pulses.  Pulmonary:     Effort: Pulmonary effort is normal.     Breath sounds: Examination of the left-upper field reveals decreased breath sounds. Examination of the left-middle field reveals decreased breath sounds. Examination of the left-lower field reveals decreased breath sounds. Decreased breath sounds present.  Abdominal:     Palpations: Abdomen is soft.     Tenderness: There is no abdominal tenderness. There is no guarding or rebound.  Musculoskeletal:     Cervical back: Neck supple.  Skin:    General: Skin is warm and dry.  Neurological:     Mental Status: He is confused.     GCS: GCS eye subscore is 4. GCS verbal subscore is 5. GCS motor subscore is 6.     Comments: CN 3-12 grossly intact Alert to self and place. Unable to say what year it is; believes it is 2003 and that Shon Millet is president. (Wife reports via phone that this is not baseline for patient)  GCS 15 Sensation and strength intact Gait nonataxic including with tandem walking Coordination with finger-to-nose WNL Neg romberg, neg pronator drift      ED Course/Procedures   Clinical Course as of Sep 29 958  Wed Sep 30, 2019  0733 Discussed case with Dr. Kipp Brood with CT surg - given patient without any symptoms related to his pleural effusion will hold off on surgery at the moment. Recommend IR consult for possible drainage. Will follow.    [MV]  2595 IR Dr. Laurence Ferrari - ?bronchoscopy vs thoracentesis.    [MV]    Clinical Course User Index [MV] Eustaquio Maize, [name]    Procedures  Results for orders placed or performed during the hospital encounter of 09/29/19  Gram stain   Specimen: Lung, Left; Pleural Fluid  Result Value Ref Range   Specimen Description PLEURAL LEFT    Special Requests NONE    Gram Stain      RARE WBC PRESENT,BOTH PMN AND MONONUCLEAR NO ORGANISMS SEEN Performed at [facility], [address], [place], [place] [address]    Report Status 09/30/2019 FINAL   SARS CORONAVIRUS 2 (TAT 6-24 HRS) Nasopharyngeal Nasopharyngeal Swab   Specimen: Nasopharyngeal Swab  Result Value Ref Range   SARS Coronavirus 2 NEGATIVE NEGATIVE  Lipase, blood  Result Value Ref Range   Lipase 29 11 - 51 U/L  Comprehensive metabolic panel  Result Value Ref Range   Sodium 138 135 - 145 mmol/L   Potassium 4.5 3.5 - 5.1 mmol/L   Chloride 97 (L) 98 -  111 mmol/L   CO2 26 22 - 32 mmol/L   Glucose, Bld 244 (H) 70 - 99 mg/dL   BUN 25 (H) 8 - 23 mg/dL   Creatinine, Ser 5.78 (H) 0.61 - 1.24 mg/dL   Calcium 10.1 8.9 - 10.3 mg/dL   Total Protein 7.0 6.5 - 8.1 g/dL   Albumin 3.0 (L) 3.5 - 5.0 g/dL   AST 17 15 - 41 U/L   ALT 7 0 - 44 U/L   Alkaline Phosphatase 92 38 - 126 U/L   Total Bilirubin 0.8 0.3 - 1.2 mg/dL   GFR calc non Af Amer 9 (L) >60 mL/min   GFR calc Af Amer 10 (L) >60 mL/min   Anion gap 15 5 - 15  CBC  Result Value Ref Range   WBC 8.1 4.0 - 10.5 K/uL   RBC 4.38 4.22 - 5.81 MIL/uL   Hemoglobin 12.0 (L) 13.0 - 17.0 g/dL   HCT 37.4 (L) 39.0 - 52.0 %   MCV 85.4 80.0 - 100.0 fL   MCH 27.4 26.0 - 34.0 pg    MCHC 32.1 30.0 - 36.0 g/dL   RDW 18.7 (H) 11.5 - 15.5 %   Platelets 200 150 - 400 K/uL   nRBC 0.0 0.0 - 0.2 %  Albumin, pleural or peritoneal fluid  Result Value Ref Range   Albumin, Fluid 1.8 g/dL   Fluid Type-FALB PLEU LEFT   Amylase, pleural or peritoneal fluid  Result Value Ref Range   Amylase, Fluid 44 U/L   Fluid Type-FAMY PLEU LEFT   Body fluid cell count with differential  Result Value Ref Range   Fluid Type-FCT PLEU LEFT    Color, Fluid AMBER    Appearance, Fluid CLOUDY (A) CLEAR   Total Nucleated Cell Count, Fluid 185 0 - 1,000 cu mm   Neutrophil Count, Fluid 34 (H) 0 - 25 %   Lymphs, Fluid 63 %   Monocyte-Macrophage-Serous Fluid 1 (L) 50 - 90 %   Eos, Fluid 2 %  Glucose, pleural or peritoneal fluid  Result Value Ref Range   Glucose, Fluid 135 mg/dL   Fluid Type-FGLU PLEU LEFT   Lactate dehydrogenase (pleural or peritoneal fluid)  Result Value Ref Range   LD, Fluid 133 (H) 3 - 23 U/L   Fluid Type-FLDH PLEU LEFT   Protein, pleural or peritoneal fluid  Result Value Ref Range   Total protein, fluid 4.2 g/dL   Fluid Type-FTP PLEU LEFT   Lactate dehydrogenase  Result Value Ref Range   LDH 120 98 - 192 U/L  POC SARS Coronavirus 2 Ag-ED - Nasal Swab (BD Veritor Kit)  Result Value Ref Range   SARS Coronavirus 2 Ag NEGATIVE NEGATIVE  CBG monitoring, ED  Result Value Ref Range   Glucose-Capillary 153 (H) 70 - 99 mg/dL   Comment 1 Notify RN    Comment 2 Document in Chart   POCT I-Stat EG7  Result Value Ref Range   pH, Ven 7.384 7.250 - 7.430   pCO2, Ven 51.8 44.0 - 60.0 mmHg   pO2, Ven 54.0 (H) 32.0 - 45.0 mmHg   Bicarbonate 30.9 (H) 20.0 - 28.0 mmol/L   TCO2 32 22 - 32 mmol/L   O2 Saturation 86.0 %   Acid-Base Excess 5.0 (H) 0.0 - 2.0 mmol/L   Sodium 135 135 - 145 mmol/L   Potassium 5.0 3.5 - 5.1 mmol/L   Calcium, Ion 1.23 1.15 - 1.40 mmol/L   HCT 37.0 (L) 39.0 - 52.0 %  Hemoglobin 12.6 (L) 13.0 - 17.0 g/dL   Patient temperature HIDE    Sample type  VENOUS    DG Chest 1 View  Result Date: 09/30/2019 CLINICAL DATA:  Shortness of breath following left thoracentesis EXAM: CHEST  1 VIEW COMPARISON:  CT from earlier in the same day. FINDINGS: Cardiac shadow is enlarged. Postsurgical changes are noted. The lungs are hypoinflated. The left lung again demonstrates a pleural effusion predominately laterally near the apex which suggests the possibility of loculation. No pneumothorax is noted. Some underlying consolidation in the left lung is noted related to the effusion. No acute bony abnormality is noted. IMPRESSION: Interval thoracentesis with decrease of left pleural fluid although an apparent loculated component is noted superiorly. No pneumothorax is noted. Electronically Signed   By: Inez Catalina M.D.   On: 09/30/2019 11:34   CT Head Wo Contrast  Result Date: 09/30/2019 CLINICAL DATA:  Altered mental status. EXAM: CT HEAD WITHOUT CONTRAST TECHNIQUE: Contiguous axial images were obtained from the base of the skull through the vertex without intravenous contrast. COMPARISON:  January 04, 2018. FINDINGS: Brain: Mild chronic ischemic white matter disease is noted. No mass effect or midline shift is noted. Ventricular size is within normal limits. There is no evidence of mass lesion, hemorrhage or acute infarction. Vascular: No hyperdense vessel or unexpected calcification. Skull: Normal. Negative for fracture or focal lesion. Sinuses/Orbits: No acute finding. Other: None. IMPRESSION: Mild chronic ischemic white matter disease. No acute intracranial abnormality seen. Electronically Signed   By: Marijo Conception M.D.   On: 09/30/2019 10:37   CT Chest Wo Contrast  Result Date: 09/30/2019 CLINICAL DATA:  Abnormal x-ray.  Left pleural effusion EXAM: CT CHEST WITHOUT CONTRAST TECHNIQUE: Multidetector CT imaging of the chest was performed following the standard protocol without IV contrast. COMPARISON:  Acute abdominal series today. FINDINGS: Cardiovascular:  Borderline heart size. No pericardial effusion. Mitral annuloplasty. Extensive coronary calcification with CABG. No acute vascular finding without contrast. Mediastinum/Nodes: Generous mediastinal lymph nodes that are likely reactive in this setting. No mediastinal collection or inflammatory changes. Densely calcified left pulmonary nodule considered incidental. Right-sided venous stenting in the distal cephalic. Lungs/Pleura: Large left pleural effusion that is loculated laterally, extending from apex to base. The effusion has a rind and there is high-density layering debris/material. A few thin associated pleural calcifications are noted. No right-sided pleural calcification. Most of the left lung is collapsed and poorly evaluated without contrast. Upper Abdomen: No emergent finding. Extensive atherosclerosis. Bilateral renal atrophy and cystic change. Layering high-density in the gallbladder, likely sludge. Colonic diverticulosis. Musculoskeletal: No erosions of the left ribs. No acute or aggressive finding. IMPRESSION: 1. Large loculated left pleural effusion with rind and dependent debris. Without contrast there is limited evaluation of the left lung which is primarily collapsed. 2. Aortic Atherosclerosis (ICD10-I70.0). Coronary atherosclerosis with CABG. Electronically Signed   By: Monte Fantasia M.D.   On: 09/30/2019 07:20   Acute Abd Series  Result Date: 09/30/2019 CLINICAL DATA:  Constipation. No bowel movement for 2 days. EXAM: DG ABDOMEN ACUTE W/ 1V CHEST COMPARISON:  Chest radiograph 11/11/2017 FINDINGS: Post median sternotomy with prosthetic valve. Cardiomegaly. Progressive left pleural effusion which is now partially loculated and tracking laterally. Associated compressive atelectasis 3 matches the left lung. Right lung is hypo aerated. Vascular stent in the region of the right subclavian. Left supraclavicular calcification corresponds to calcified thyroid nodule on neck CTA 09/19/2017. Moderate  to large colonic stool burden. Stool balls in the distal sigmoid colon.  Small volume of rectal stool. No small bowel dilatation to suggest obstruction. No free air. And I will prosthesis partially included. There are vascular calcifications. No acute osseous abnormalities are seen. IMPRESSION: 1. Moderate to large colonic stool burden. No bowel obstruction or free air. 2. Progressive left pleural effusion since 2019, now large in size, partially loculated and tracking laterally. 3. Cardiomegaly. Electronically Signed   By: Keith Rake M.D.   On: 09/30/2019 05:46   HYBRID OR IMAGING ([facility] ONLY)  Result Date: 09/24/2019 There is no interpretation for this exam.  This order is for images obtained during a surgical procedure.  Please See "Surgeries" Tab for more information regarding the procedure.   IR THORACENTESIS ASP PLEURAL SPACE W/IMG GUIDE  Result Date: 09/30/2019 INDICATION: Patient with history of hepatitis-C, CHF, mitral valve disease status post repair 2018 and end-stage renal disease on hemodialysis presented to the ED with complaints of abdominal pain. Imaging shows left loculated effusion with thickened rind. Request to IR for diagnostic and therapeutic thoracentesis. EXAM: ULTRASOUND GUIDED LEFT THORACENTESIS MEDICATIONS: 8 mL 1% lidocaine COMPLICATIONS: None immediate. PROCEDURE: An ultrasound guided thoracentesis was thoroughly discussed with the patient and questions answered. The benefits, risks, alternatives and complications were also discussed. The patient understands and wishes to proceed with the procedure. Written consent was obtained. Ultrasound was performed to localize and mark an adequate pocket of fluid in the left chest. The area was then prepped and draped in the normal sterile fashion. 1% Lidocaine was used for local anesthesia. Under ultrasound guidance a 6 Fr Safe-T-Centesis catheter was introduced, however thickened rind could not be penetrated. Safe-T-Centesis was  removed and 19 gauge 7 cm Yueh was successfully inserted. Thoracentesis was performed. The catheter was removed and a dressing applied. FINDINGS: A total of approximately 650 mL of amber fluid was removed. Samples were sent to the laboratory as requested by the clinical team. IMPRESSION: Successful ultrasound guided left thoracentesis yielding 650 mL of pleural fluid. Read by Candiss Norse, [name] Electronically Signed   By: Jacqulynn Cadet M.D.   On: 09/30/2019 11:38    MDM  CT chest with loculated pleural effusion. Have discussed case with Dr. Kipp Brood with CT surgery who will evaluate patient but given no complaints of SOB do not feel patient needs surgery. Dr. Laurence Ferrari with IR suggests thoracentesis for diagnostic purposes as well as therapeutic; will place order. Depending on findings patient may be able to follow up outpatient with Dr. Roxan Hockey with CT surg.   During ED visit nursing staff reported that pt became more somnolent; placed on 2L initially. Pt unable to state the year or month. He believes it is 2003 and the president is Shon Millet; able to follow commands appropriately although falling asleep during exam. ?delirium vs hypercarbia vs stroke. Will obtain VBG and CT head prior to thoracentesis. Pt has been in the ED for over 17 hours and may very well be tired.   CT Head negative. VBG with mild elevation in bicarb at 30; otherwise stable.   Thoracentesis performed; 650 CCs drained. Awaiting lab results at this time. Will continue to monitor patient's mental status.   Labs returned; with light's criteria there is concern for exudative pleural effusion at this time. Upon reevaluation patient still extremely sleepy on exam and confused. We had initially discussed sending patient home with outpatient follow up but given confusion do not feel he is safe for discharge at this time. Will consult hospitalist for admission. Dr. Rex Kras attending physician agrees with plan.  Discussed  case with Dr. Tarri Abernethy with IM teaching service who agrees to accept patient.        Eustaquio Maize, [name] 10/01/19 [id number]    Little, Wenda Overland, MD 10/03/19 [phone]

## 2019-09-30 NOTE — ED Notes (Signed)
Pt taken to CT.

## 2019-09-30 NOTE — ED Provider Notes (Signed)
[facility] EMERGENCY DEPARTMENT Provider Note   CSN: [id number] Arrival date & time: 09/29/19  1657     History Chief Complaint  Patient presents with  . Constipation    Ernest [name] is a 73 y.o. male.  The history is provided by the patient and medical records.  Constipation   73 y.o. M with hx of anemia, anxiety, CHF, ESRD on HD (MWF), hep C, MV disease, HLP, HTN, prior stroke, DM2, presenting to the ED for constipation. Patient reports he has not been able to have a BM in about 2 days.  States he has been straining but nothing is coming out.  He states he does not have any abdominal pain, but does have some discomfort in his rectum from trying to strain for BM.  He denies blood in the stool. No fever, chills, nausea, vomiting, or diarrhea.  Has been able to eat/drink without issue.  Still passing flatus.  Past Medical History:  Diagnosis Date  . Abnormal stress test    s/p cath November 2013 with modest disease involving the ostial left main, proximal LAD, proximal RCA - do not appear to be hemodynamically signficant; mild LV dysfunction  . Anemia    low iron  . Anxiety   . Arthritis   . Bacteremia   . Chronic systolic CHF (congestive heart failure) ([place])   . COPD (chronic obstructive pulmonary disease) ([place])   . Coronary artery disease    a. per cath report 2013, b. 09/13/2017-CABG X4 & Mitral valve repair  . Depression   . Ejection fraction < 50%   . Erectile dysfunction    penile implant  . ESRD (end stage renal disease) ([place])    Started HD in [place] in 2009, ESRD was due to DM. Moved to [facility] in Dec 2009 and now gets dialysis at [facility] on a MWF schedule.     . Gout    Hx: of  . Hepatitis C   . History of seizures   . Hyperlipidemia   . Hypertension   . Mitral valve disease    s/p Mitral repair 09/13/17  . Obesity   . Retinopathy   . Shortness of breath    Hx: of with exertion  . Stroke [facility])    does have weakness  . Type II or  unspecified type diabetes mellitus without mention of complication, not stated as uncontrolled    adult onset    Patient Active Problem List   Diagnosis Date Noted  . Chest pain 12/02/2018  . Vascular dementia ([place]) 10/10/2017  . Paroxysmal atrial flutter ([place]) 10/10/2017  . S/P CABG x 4 10/04/2017  . Cerebral embolism with cerebral infarction 09/20/2017  . NSVT (nonsustained ventricular tachycardia) ([place])   . Dyspnea 09/08/2017  . Non-ST elevation MI (NSTEMI) ([place])   . Acute on chronic combined systolic and diastolic heart failure ([place])   . End-stage renal disease on hemodialysis ([place])   . Left leg weakness   . TIA (transient ischemic attack) 10/27/2016  . History of CVA with residual deficit 08/07/2015  . Seizure, late effect of stroke ([place]) 08/07/2015  . Diabetic peripheral neuropathy ([place]) 08/07/2015  . Obstructive sleep apnea 06/16/2013  . Hyperlipidemia associated with type 2 diabetes mellitus ([place])   . ESRD (end stage renal disease) ([place])   . Erectile dysfunction associated with type 2 diabetes mellitus ([place])   . Hypertension due to end stage renal disease caused by type 2 diabetes mellitus, on dialysis ([place])   .  COPD (chronic obstructive pulmonary disease) ([place])   . Coronary artery disease   . Chronic combined systolic and diastolic CHF (congestive heart failure) ([place])   . Gout, unspecified 02/28/2009  . Uncontrolled type 2 diabetes mellitus with end-stage renal disease ([place]) 06/08/1984    Past Surgical History:  Procedure Laterality Date  . A/V FISTULAGRAM N/A 10/04/2017   Procedure: A/V FISTULAGRAM;  Surgeon: Elam Dutch, MD;  Location: [facility] CV LAB;  Service: Cardiovascular;  Laterality: N/A;  . ANGIOPLASTY Left 09/24/2019   Procedure: Angioplasty Innominate Vien;  Surgeon: Serafina Mitchell, MD;  Location: [place];  Service: Vascular;  Laterality: Left;  . AV FISTULA PLACEMENT Left 01/13/2013   Procedure: INSERTION OF ARTERIOVENOUS (AV) GORE-TEX GRAFT ARM;   Surgeon: Angelia Mould, MD;  Location: [place];  Service: Vascular;  Laterality: Left;  . AV FISTULA PLACEMENT Left 05/05/2013   Procedure: INSERTION OF LEFT UPPER ARM  ARTERIOVENOUS GORTEX GRAFT;  Surgeon: Angelia Mould, MD;  Location: [place];  Service: Vascular;  Laterality: Left;  . [place] REMOVAL Left 03/26/2013   Procedure: REMOVAL OF  NON INCORPORATED ARTERIOVENOUS GORETEX GRAFT ([place]) left arm * repair of  left brachial artery with vein patch angioplasty.;  Surgeon: Angelia Mould, MD;  Location: [place];  Service: Vascular;  Laterality: Left;  . CARDIAC CATHETERIZATION  August 26, 2012  . CATARACT EXTRACTION W/ INTRAOCULAR LENS  IMPLANT, BILATERAL    . COLONOSCOPY    . CORONARY ARTERY BYPASS GRAFT N/A 09/13/2017   Procedure: CORONARY ARTERY BYPASS GRAFTING (CABG) times four LIMA  to LAD, SVG sequentially to OM and RAMUS Intermediate and SVG to Acute Marginal;  Surgeon: Melrose Nakayama, MD;  Location: [place];  Service: Open Heart Surgery;  Laterality: N/A;  . EMBOLECTOMY Right 08/27/2013   Procedure: EMBOLECTOMY;  Surgeon: Mal Misty, MD;  Location: [place];  Service: Vascular;  Laterality: Right;  Thrombectomy of Radial and ulnar artery.  . ENDOVASCULAR STENT INSERTION Left 09/24/2019   Procedure: Endovascular Stent Graft Insertion, Arm Graft;  Surgeon: Serafina Mitchell, MD;  Location: [place];  Service: Vascular;  Laterality: Left;  . EYE SURGERY     laser.  and surgery for DM  . fistula     RUE and wrist  . FISTULOGRAM Left 09/24/2019   Procedure: SHUNTOGRAM OF ARTERIOVENOUS FISTULA LEFT ARM,;  Surgeon: Serafina Mitchell, MD;  Location: [place];  Service: Vascular;  Laterality: Left;  . INSERTION OF DIALYSIS CATHETER Right 01/01/2013   Procedure: INSERTION OF DIALYSIS CATHETER right  internal jugular;  Surgeon: Serafina Mitchell, MD;  Location: [place];  Service: Vascular;  Laterality: Right;  . INSERTION OF DIALYSIS CATHETER N/A 03/26/2013   Procedure: INSERTION OF  DIALYSIS CATHETER Left internal jugular vein;  Surgeon: Angelia Mould, MD;  Location: [place];  Service: Vascular;  Laterality: N/A;  . LEFT HEART CATH AND CORONARY ANGIOGRAPHY N/A 09/09/2017   Procedure: LEFT HEART CATH AND CORONARY ANGIOGRAPHY;  Surgeon: Troy Sine, MD;  Location: [place] CV LAB;  Service: Cardiovascular;  Laterality: N/A;  . LIGATION OF ARTERIOVENOUS  FISTULA Right 12/30/2013   Procedure: REMOVAL OF SEGMENT OF GORTEX GRAFT AND FISTULA  AND REPAIR OF BRACHIAL ARTERY;  Surgeon: Mal Misty, MD;  Location: [place];  Service: Vascular;  Laterality: Right;  . MITRAL VALVE REPAIR N/A 09/13/2017   Procedure: MITRAL VALVE REPAIR (MVR);  Surgeon: Melrose Nakayama, MD;  Location: [place];  Service: Open Heart Surgery;  Laterality: N/A;  . MITRAL VALVE REPAIR (MV)/CORONARY ARTERY BYPASS GRAFTING (CABG)  09/13/2017  . PENILE PROSTHESIS IMPLANT     1997.. no card  . PERIPHERAL VASCULAR BALLOON ANGIOPLASTY Left 10/04/2017   Procedure: PERIPHERAL VASCULAR BALLOON ANGIOPLASTY;  Surgeon: Elam Dutch, MD;  Location: [facility] CV LAB;  Service: Cardiovascular;  Laterality: Left;  AVF  . REVISION OF ARTERIOVENOUS GORETEX GRAFT Left 09/24/2019   Procedure: REVISION OF ARTERIOVENOUS GORETEX GRAFT LEFT ARM;  Surgeon: Serafina Mitchell, MD;  Location: [place];  Service: Vascular;  Laterality: Left;  . TEE WITHOUT CARDIOVERSION N/A 06/11/2013   Procedure: TRANSESOPHAGEAL ECHOCARDIOGRAM (TEE);  Surgeon: Larey Dresser, MD;  Location: [facility] ENDOSCOPY;  Service: Cardiovascular;  Laterality: N/A;  . TEE WITHOUT CARDIOVERSION N/A 09/13/2017   Procedure: TRANSESOPHAGEAL ECHOCARDIOGRAM (TEE);  Surgeon: Melrose Nakayama, MD;  Location: [place];  Service: Open Heart Surgery;  Laterality: N/A;  . THROMBECTOMY W/ EMBOLECTOMY Left 03/26/2013   Procedure: Attempted thrombectomy of left arm arteriovenous goretex graft.;  Surgeon: Angelia Mould, MD;  Location: [facility];  Service: Vascular;   Laterality: Left;  . ULTRASOUND GUIDANCE FOR VASCULAR ACCESS Bilateral 09/24/2019   Procedure: Ultrasound Guidance For Vascular Access, Right Femoral Vein and Left Arm Graft;  Surgeon: Serafina Mitchell, MD;  [facility];  Service: Vascular;  Laterality: Bilateral;  . VENOGRAM N/A 04/07/2013   Procedure: VENOGRAM;  Surgeon: Serafina Mitchell, MD;  Location: [facility] CATH LAB;  Service: Cardiovascular;  Laterality: N/A;       Family History  Problem Relation Age of Onset  . Heart disease Father   . Diabetes Sister   . Diabetes Brother   . Diabetes Son     Social History   Tobacco Use  . Smoking status: Former Smoker    Years: 7.00    Quit date: 06/10/1973    Years since quitting: 46.3  . Smokeless tobacco: Never Used  . Tobacco comment: Quit in 1974.  Substance Use Topics  . Alcohol use: No    Alcohol/week: 0.0 standard drinks  . Drug use: No    Home Medications Prior to Admission medications   Medication Sig Start Date End Date Taking? Authorizing Provider  aspirin EC 81 MG tablet Take 1 tablet (81 mg total) by mouth daily. 11/29/15   Rivet, Sindy Guadeloupe, MD  atorvastatin (LIPITOR) 40 MG tablet Take 40 mg by mouth daily.     [provider]  B Complex-C-Folic Acid (NEPHRO-VITE PO) Take 1 tablet by mouth daily.    [provider]  B Complex-C-Folic Acid (RENA-VITE RX) 1 MG TABS Take 1 tablet by mouth daily. 07/10/19   [provider]  calcium acetate (PHOSLO) 667 MG capsule Take 667 mg by mouth 3 (three) times daily with meals.     [provider]  cinacalcet (SENSIPAR) 30 MG tablet Take 4 tablets (120 mg total) by mouth daily with supper. 10/07/17   Jadene Pierini E, [name]  colchicine 0.6 MG tablet colchicine 0.6 mg tablet  Take 1 tablet 6 times a day by oral route for 7 days.    [provider]  cyproheptadine (PERIACTIN) 2 MG/5ML syrup cyproheptadine 2 mg/5 mL oral syrup    [provider]  Darbepoetin Alfa (ARANESP) 100 MCG/0.5ML SOSY  injection Inject 100 mcg into the skin every 7 (seven) days. Administered with dialysis    [provider]  diclofenac Sodium (VOLTAREN) 1 % GEL diclofenac 1 % topical gel    [provider]  doxercalciferol (HECTOROL) 4 MCG/2ML injection Inject 2 mcg into the vein every Monday, Wednesday, and Friday with hemodialysis. Administered at dialysis    [provider]  ferric citrate (AURYXIA) 1 GM 210 MG(Fe) tablet Take 420 mg by mouth 3 (three) times daily with meals.     [provider]  ferric gluconate 125 mg in sodium chloride 0.9 % 100 mL Inject 250 mg into the vein every Monday, Wednesday, and Friday with hemodialysis.     [provider]  insulin detemir (LEVEMIR) 100 UNIT/ML injection Inject 0.2 mLs (20 Units total) into the skin 2 (two) times daily. 10/07/17   Gold, Wilder Glade, [name]  levETIRAcetam (KEPPRA) 500 MG tablet Take 2 tablets (1,000 mg total) by mouth daily. Then take extra 500 mg (1 tablet) on MWF after HD 01/20/19   Pieter Partridge, DO  linaclotide (LINZESS) 145 MCG CAPS capsule Take 145 mcg by mouth daily.    [provider]  metoprolol tartrate (LOPRESSOR) 50 MG tablet Take 1 tablet (50 mg total) by mouth 2 (two) times daily. 03/11/18 09/24/19  Daune Perch, NP  Nutritional Supplements (FEEDING SUPPLEMENT, NEPRO CARB STEADY,) LIQD Take 237 mLs by mouth 2 (two) times daily between meals. 10/07/17   Mckenna Giovanni, [name]  oxyCODONE-acetaminophen (PERCOCET/ROXICET) 5-325 MG tablet Take 1 tablet by mouth every 6 (six) hours as needed. 09/24/19   Ulyses Amor, [name]  oxyCODONE-acetaminophen (PERCOCET/ROXICET) 5-325 MG tablet Take 1 tablet by mouth every 6 (six) hours as needed. 09/24/19   Ulyses Amor, [name]  sennosides-docusate sodium (SENOKOT-S) 8.6-50 MG tablet Take 2 tablets by mouth 2 (two) times daily.    [provider]  sertraline (ZOLOFT) 25 MG tablet Take 25 mg by mouth at bedtime.    [provider]     Allergies    Patient has no known allergies.  Review of Systems   Review of Systems  Gastrointestinal: Positive for constipation.  All other systems reviewed and are negative.   Physical Exam Updated Vital Signs BP (!) 110/59 (BP Location: Right Arm)   Pulse 70   Temp 98.5 F (36.9 C) (Oral)   Resp 16   SpO2 97%   Physical Exam Vitals and nursing note reviewed.  Constitutional:      Appearance: He is well-developed.  HENT:     Head: Normocephalic and atraumatic.  Eyes:     Conjunctiva/sclera: Conjunctivae normal.     Pupils: Pupils are equal, round, and reactive to light.  Cardiovascular:     Rate and Rhythm: Normal rate and regular rhythm.     Heart sounds: Normal heart sounds.  Pulmonary:     Effort: Pulmonary effort is normal.     Breath sounds: Normal breath sounds.  Abdominal:     General: Bowel sounds are normal.     Palpations: Abdomen is soft.     Tenderness: There is no abdominal tenderness.     Comments: Soft, non-tender, no distention, normal bowel sounds  Genitourinary:    Comments: Exam chaperoned by RN Normal rectum without hemorrhoids or other masses, light brown stool noted on DRE, no gross blood, no fecal impaction Musculoskeletal:        General: Normal range of motion.     Cervical back: Normal range of motion.  Skin:    General: Skin is warm and dry.  Neurological:     Mental Status: He is alert and oriented to person, place, and time.     ED Results /  Procedures / Treatments   Labs (all labs ordered are listed, but only abnormal results are displayed) Labs Reviewed  COMPREHENSIVE METABOLIC PANEL - Abnormal; Notable for the following components:      Result Value   Chloride 97 (*)    Glucose, Bld 244 (*)    BUN 25 (*)    Creatinine, Ser 5.78 (*)    Albumin 3.0 (*)    GFR calc non Af Amer 9 (*)    GFR calc Af Amer 10 (*)    All other components within normal limits  CBC - Abnormal; Notable for the following components:    Hemoglobin 12.0 (*)    HCT 37.4 (*)    RDW 18.7 (*)    All other components within normal limits  LIPASE, BLOOD    EKG None  Radiology Acute Abd Series  Result Date: 09/30/2019 CLINICAL DATA:  Constipation. No bowel movement for 2 days. EXAM: DG ABDOMEN ACUTE W/ 1V CHEST COMPARISON:  Chest radiograph 11/11/2017 FINDINGS: Post median sternotomy with prosthetic valve. Cardiomegaly. Progressive left pleural effusion which is now partially loculated and tracking laterally. Associated compressive atelectasis 3 matches the left lung. Right lung is hypo aerated. Vascular stent in the region of the right subclavian. Left supraclavicular calcification corresponds to calcified thyroid nodule on neck CTA 09/19/2017. Moderate to large colonic stool burden. Stool balls in the distal sigmoid colon. Small volume of rectal stool. No small bowel dilatation to suggest obstruction. No free air. And I will prosthesis partially included. There are vascular calcifications. No acute osseous abnormalities are seen. IMPRESSION: 1. Moderate to large colonic stool burden. No bowel obstruction or free air. 2. Progressive left pleural effusion since 2019, now large in size, partially loculated and tracking laterally. 3. Cardiomegaly. Electronically Signed   By: Keith Rake M.D.   On: 09/30/2019 05:46    Procedures Procedures (including critical care time)  Medications Ordered in ED Medications - No data to display  ED Course  I have reviewed the triage vital signs and the nursing notes.  Pertinent labs & imaging results that were available during my care of the patient were reviewed by me and considered in my medical decision making (see chart for details).    MDM Rules/Calculators/A&P  73 year old male here with reported constipation.  States has not had a bowel movement about 2 days.  He denies any abdominal pain but states he is feeling some pain in his rectum, thinks it is from straining to try to have a  bowel movement.  He has not had any nausea, vomiting, fever.  He has continue eating and drinking normally.  He is afebrile, non-toxic.  Abdomen is soft and benign.  Rectal exam performed, no signs of impaction, hemorrhoids, or rectal bleeding.  Will obtain abdominal films.  Labs grossly reassuring given his HD status, normal K+.  6:08 AM X-ray with stool burden, no obstructive findings.  He is on chronic narcotics which may be causing this.  He is supposed to be taken sennakot, unsure if he has been taking daily.  films also with very large pleural effusion on left.  Progressed from Jan 2019.  Patient reassessed-- he is resting comfortably without respiratory distress.  VSS on RA.  On review, effusion is quite large, nearly the entire left lung.  He denies any SOB or chest pain currently.  No cough or fever.  Negative COVID test on 09/24/19.  Discussed with attending, Dr. Dayna Barker-- will get CT of the chest for further evaluation.  Care signed out to oncoming provider.  If no indications for admission based on CT findings and VS remain stable, would recommend starting miralax until BM's improve, then can switch back to daily sennakot.  Will need close follow-up with PCP.  Final Clinical Impression(s) / ED Diagnoses Final diagnoses:  Constipation, unspecified constipation type    Rx / DC Orders ED Discharge Orders    None       Larene Pickett, [name] 09/30/19 [id number]    Merrily Pew, MD 09/30/19 [phone]

## 2019-09-30 NOTE — ED Notes (Signed)
Pt returned from IR.

## 2019-10-01 LAB — ACID FAST SMEAR (AFB, MYCOBACTERIA): Acid Fast Smear: NEGATIVE

## 2019-10-01 LAB — PH, BODY FLUID: pH, Body Fluid: 7.5

## 2019-10-01 LAB — CYTOLOGY - NON PAP

## 2019-10-01 NOTE — Discharge Summary (Signed)
Name: Ernest [name] MRN: [id number] DOB: March 17, 1946 73 y.o. PCP: Patient, [name]  Date of Admission: 09/29/2019  4:57 PM Date of Discharge: 09/30/2019 Attending Physician: Lalla Brothers, MD  Discharge Diagnosis: 1. Constipation  2. Left Pleural Effusion   Discharge Medications: Allergies as of 09/30/2019   [name]     Medication List    TAKE these medications   aspirin EC 81 MG tablet Take 1 tablet (81 mg total) by mouth daily.   atorvastatin 40 MG tablet Commonly known as: LIPITOR Take 40 mg by mouth daily.   calcium acetate 667 MG capsule Commonly known as: PHOSLO Take 667 mg by mouth 3 (three) times daily with meals.   cinacalcet 30 MG tablet Commonly known as: SENSIPAR Take 4 tablets (120 mg total) by mouth daily with supper.   colchicine 0.6 MG tablet colchicine 0.6 mg tablet  Take 1 tablet 6 times a day by oral route for 7 days.   cyproheptadine 2 MG/5ML syrup Commonly known as: PERIACTIN cyproheptadine 2 mg/5 mL oral syrup   Darbepoetin Alfa 100 MCG/0.5ML Sosy injection Commonly known as: ARANESP Inject 100 mcg into the skin every 7 (seven) days. Administered with dialysis   diclofenac Sodium 1 % Gel Commonly known as: VOLTAREN diclofenac 1 % topical gel   doxercalciferol 4 MCG/2ML injection Commonly known as: HECTOROL Inject 2 mcg into the vein every Monday, Wednesday, and Friday with hemodialysis. Administered at dialysis   feeding supplement (NEPRO CARB STEADY) Liqd Take 237 mLs by mouth 2 (two) times daily between meals.   ferric citrate 1 GM 210 MG(Fe) tablet Commonly known as: AURYXIA Take 420 mg by mouth 3 (three) times daily with meals.   ferric gluconate 125 mg in sodium chloride 0.9 % 100 mL Inject 250 mg into the vein every Monday, Wednesday, and Friday with hemodialysis.   insulin detemir 100 UNIT/ML injection Commonly known as: LEVEMIR Inject 0.2 mLs (20 Units total) into the skin 2 (two) times daily.     levETIRAcetam 500 MG tablet Commonly known as: KEPPRA Take 2 tablets (1,000 mg total) by mouth daily. Then take extra 500 mg (1 tablet) on MWF after HD   linaclotide 145 MCG Caps capsule Commonly known as: LINZESS Take 145 mcg by mouth daily.   metoprolol tartrate 50 MG tablet Commonly known as: LOPRESSOR Take 1 tablet (50 mg total) by mouth 2 (two) times daily.   NEPHRO-VITE PO Take 1 tablet by mouth daily.   Rena-Vite Rx 1 MG Tabs Take 1 tablet by mouth daily.   oxyCODONE-acetaminophen 5-325 MG tablet Commonly known as: PERCOCET/ROXICET Take 1 tablet by mouth every 6 (six) hours as needed.   oxyCODONE-acetaminophen 5-325 MG tablet Commonly known as: PERCOCET/ROXICET Take 1 tablet by mouth every 6 (six) hours as needed.   sennosides-docusate sodium 8.6-50 MG tablet Commonly known as: SENOKOT-S Take 2 tablets by mouth 2 (two) times daily.   sertraline 25 MG tablet Commonly known as: ZOLOFT Take 25 mg by mouth at bedtime.     Disposition and follow-up:   Mr.Sai H Flexer was discharged from [facility] in Stable condition.  At the hospital follow up visit please address:  1.  Left pleural effusion. Follow-up cytology and ensure he follows up with Dr. Roxan Hockey  2.  Labs / imaging needed at time of follow-up: None  3.  Pending labs/ test needing follow-up: Cytology  Follow-up Appointments: Follow-up Information    Melrose Nakayama, MD Follow up in  1 month(s).   Specialty: Cardiothoracic Surgery Contact information: [address] [place] [address] [place] Hospital Course by problem list:  Dex Ganschow is a 73 y.o male with CAD s/p 4 vessel CABG and Mitral valve repair in 2018, ESRD on HD, DM, and chronic constipation who presented to the emergency department with complaints of abdominal pain. Abdominal x-rays were obtained to evaluate his pain. The films noted moderate to large colonic stool burden and  progression of the left pleural effusion that has been noted since 2018. CT of the chest was performed that illustrated a loculated effusion. IR was consulted for thoracentesis and we are consulted for admission for further evaluation/management. Cardiothoracic surgery was consulted by the ED. Did not recommend surgical intervention. Discussed the case with pulmonology. Felt that this was likely secondary to his CABG and because of the chronic nature has formed a rind and loculation. Recommended outpatient follow-up with CT surgery. His cytology is currently still pending.   After discussion with pulmonology and finding out that the patient's daughter's funeral is on 10/01/2019 it was determine that the patient could be discharged from the ED with follow-up in 2-4 weeks.   Discharge Vitals:   BP (!) 154/87   Pulse (!) 56   Temp 98.5 F (36.9 C) (Oral)   Resp 19   SpO2 96%   Discharge Instructions: Discharge Instructions    Diet - low sodium heart healthy   Complete by: As directed    Discharge instructions   Complete by: As directed    Please follow-up with her primary care doctor and Dr. Roxan Hockey within the next month. If you develop any worsening shortness of breath, chest pain, persistent coughing please seek medical attention immediately.   Increase activity slowly   Complete by: As directed     Signed: Ina Homes, MD 10/01/2019, 10:46 AM   Pager: [phone]

## 2019-10-05 LAB — CULTURE, BODY FLUID W GRAM STAIN -BOTTLE: Culture: NO GROWTH

## 2019-10-16 DIAGNOSIS — E1129 Type 2 diabetes mellitus with other diabetic kidney complication: Secondary | ICD-10-CM | POA: Diagnosis not present

## 2019-10-16 DIAGNOSIS — Z992 Dependence on renal dialysis: Secondary | ICD-10-CM | POA: Diagnosis not present

## 2019-10-16 DIAGNOSIS — N186 End stage renal disease: Secondary | ICD-10-CM | POA: Diagnosis not present

## 2019-10-17 DIAGNOSIS — D631 Anemia in chronic kidney disease: Secondary | ICD-10-CM | POA: Diagnosis not present

## 2019-10-17 DIAGNOSIS — E119 Type 2 diabetes mellitus without complications: Secondary | ICD-10-CM | POA: Diagnosis not present

## 2019-10-17 DIAGNOSIS — Z992 Dependence on renal dialysis: Secondary | ICD-10-CM | POA: Diagnosis not present

## 2019-10-17 DIAGNOSIS — N186 End stage renal disease: Secondary | ICD-10-CM | POA: Diagnosis not present

## 2019-10-17 DIAGNOSIS — N2581 Secondary hyperparathyroidism of renal origin: Secondary | ICD-10-CM | POA: Diagnosis not present

## 2019-10-17 DIAGNOSIS — D509 Iron deficiency anemia, unspecified: Secondary | ICD-10-CM | POA: Diagnosis not present

## 2019-10-18 NOTE — Progress Notes (Deleted)
Cardiology Office Note   Date:  10/18/2019   ID:  Ernest [name], DOB [birth year], MRN [id number]  PCP:  Patient, [name]    No chief complaint on file.  CAD  Wt Readings from Last 3 Encounters:  09/24/19 202 lb (91.6 kg)  09/21/19 202 lb (91.6 kg)  12/02/18 202 lb 12.8 oz (92 kg)       History of Present Illness: Ernest [name] is a 74 y.o. male with ESRD, chronic combined systolic and diastolic CHF, CVA, HLD, DM type II, hypertension and COPD.    He had CABG and MV repair in 11/18 by Dr. Roxan Hockey.    He had AFib, but Amio and coumadin were stopped due to inadequate monitoring.  Social issues have been barriers to care for him in the past.  He left a nursing facility AMA in the past.       Past Medical History:  Diagnosis Date  . Abnormal stress test    s/p cath November 2013 with modest disease involving the ostial left main, proximal LAD, proximal RCA - do not appear to be hemodynamically signficant; mild LV dysfunction  . Anemia    low iron  . Anxiety   . Arthritis   . Bacteremia   . Chronic systolic CHF (congestive heart failure) ([place])   . COPD (chronic obstructive pulmonary disease) ([place])   . Coronary artery disease    a. per cath report 2013, b. 09/13/2017-CABG X4 & Mitral valve repair  . Depression   . Ejection fraction < 50%   . Erectile dysfunction    penile implant  . ESRD (end stage renal disease) ([place])    Started HD in [place] in 2009, ESRD was due to DM. Moved to [facility] in Dec 2009 and now gets dialysis at [facility] on a MWF schedule.     . Gout    Hx: of  . Hepatitis C   . History of seizures   . Hyperlipidemia   . Hypertension   . Mitral valve disease    s/p Mitral repair 09/13/17  . Obesity   . Retinopathy   . Shortness of breath    Hx: of with exertion  . Stroke [facility])    does have weakness  . Type II or unspecified type diabetes mellitus without mention of complication, not stated as uncontrolled    adult onset    Past  Surgical History:  Procedure Laterality Date  . A/V FISTULAGRAM N/A 10/04/2017   Procedure: A/V FISTULAGRAM;  Surgeon: Elam Dutch, MD;  Location: [place] CV LAB;  Service: Cardiovascular;  Laterality: N/A;  . ANGIOPLASTY Left 09/24/2019   Procedure: Angioplasty Innominate Vien;  Surgeon: Serafina Mitchell, MD;  Location: [place];  Service: Vascular;  Laterality: Left;  . AV FISTULA PLACEMENT Left 01/13/2013   Procedure: INSERTION OF ARTERIOVENOUS (AV) GORE-TEX GRAFT ARM;  Surgeon: Angelia Mould, MD;  Location: [place];  Service: Vascular;  Laterality: Left;  . AV FISTULA PLACEMENT Left 05/05/2013   Procedure: INSERTION OF LEFT UPPER ARM  ARTERIOVENOUS GORTEX GRAFT;  Surgeon: Angelia Mould, MD;  Location: [place];  Service: Vascular;  Laterality: Left;  . [place] REMOVAL Left 03/26/2013   Procedure: REMOVAL OF  NON INCORPORATED ARTERIOVENOUS GORETEX GRAFT ([place]) left arm * repair of  left brachial artery with vein patch angioplasty.;  Surgeon: Angelia Mould, MD;  Location: [place];  Service: Vascular;  Laterality: Left;  . CARDIAC CATHETERIZATION  August 26, 2012  . CATARACT EXTRACTION W/ INTRAOCULAR LENS  IMPLANT, BILATERAL    . COLONOSCOPY    . CORONARY ARTERY BYPASS GRAFT N/A 09/13/2017   Procedure: CORONARY ARTERY BYPASS GRAFTING (CABG) times four LIMA  to LAD, SVG sequentially to OM and RAMUS Intermediate and SVG to Acute Marginal;  Surgeon: Melrose Nakayama, MD;  Location: [place];  Service: Open Heart Surgery;  Laterality: N/A;  . EMBOLECTOMY Right 08/27/2013   Procedure: EMBOLECTOMY;  Surgeon: Mal Misty, MD;  Location: [place];  Service: Vascular;  Laterality: Right;  Thrombectomy of Radial and ulnar artery.  . ENDOVASCULAR STENT INSERTION Left 09/24/2019   Procedure: Endovascular Stent Graft Insertion, Arm Graft;  Surgeon: Serafina Mitchell, MD;  Location: [place];  Service: Vascular;  Laterality: Left;  . EYE SURGERY     laser.  and surgery for DM  . fistula       RUE and wrist  . FISTULOGRAM Left 09/24/2019   Procedure: SHUNTOGRAM OF ARTERIOVENOUS FISTULA LEFT ARM,;  Surgeon: Serafina Mitchell, MD;  Location: [place];  Service: Vascular;  Laterality: Left;  . INSERTION OF DIALYSIS CATHETER Right 01/01/2013   Procedure: INSERTION OF DIALYSIS CATHETER right  internal jugular;  Surgeon: Serafina Mitchell, MD;  Location: [place];  Service: Vascular;  Laterality: Right;  . INSERTION OF DIALYSIS CATHETER N/A 03/26/2013   Procedure: INSERTION OF DIALYSIS CATHETER Left internal jugular vein;  Surgeon: Angelia Mould, MD;  Location: [place];  Service: Vascular;  Laterality: N/A;  . IR THORACENTESIS ASP PLEURAL SPACE W/IMG GUIDE  09/30/2019  . LEFT HEART CATH AND CORONARY ANGIOGRAPHY N/A 09/09/2017   Procedure: LEFT HEART CATH AND CORONARY ANGIOGRAPHY;  Surgeon: Troy Sine, MD;  Location: [place] CV LAB;  Service: Cardiovascular;  Laterality: N/A;  . LIGATION OF ARTERIOVENOUS  FISTULA Right 12/30/2013   Procedure: REMOVAL OF SEGMENT OF GORTEX GRAFT AND FISTULA  AND REPAIR OF BRACHIAL ARTERY;  Surgeon: Mal Misty, MD;  Location: [place];  Service: Vascular;  Laterality: Right;  . MITRAL VALVE REPAIR N/A 09/13/2017   Procedure: MITRAL VALVE REPAIR (MVR);  Surgeon: Melrose Nakayama, MD;  Location: [place];  Service: Open Heart Surgery;  Laterality: N/A;  . MITRAL VALVE REPAIR (MV)/CORONARY ARTERY BYPASS GRAFTING (CABG)  09/13/2017  . PENILE PROSTHESIS IMPLANT     1997.. no card  . PERIPHERAL VASCULAR BALLOON ANGIOPLASTY Left 10/04/2017   Procedure: PERIPHERAL VASCULAR BALLOON ANGIOPLASTY;  Surgeon: Elam Dutch, MD;  Location: [place] CV LAB;  Service: Cardiovascular;  Laterality: Left;  AVF  . REVISION OF ARTERIOVENOUS GORETEX GRAFT Left 09/24/2019   Procedure: REVISION OF ARTERIOVENOUS GORETEX GRAFT LEFT ARM;  Surgeon: Serafina Mitchell, MD;  Location: [place];  Service: Vascular;  Laterality: Left;  . TEE WITHOUT CARDIOVERSION N/A 06/11/2013    Procedure: TRANSESOPHAGEAL ECHOCARDIOGRAM (TEE);  Surgeon: Larey Dresser, MD;  Location: [facility] ENDOSCOPY;  Service: Cardiovascular;  Laterality: N/A;  . TEE WITHOUT CARDIOVERSION N/A 09/13/2017   Procedure: TRANSESOPHAGEAL ECHOCARDIOGRAM (TEE);  Surgeon: Melrose Nakayama, MD;  Location: [place];  Service: Open Heart Surgery;  Laterality: N/A;  . THROMBECTOMY W/ EMBOLECTOMY Left 03/26/2013   Procedure: Attempted thrombectomy of left arm arteriovenous goretex graft.;  Surgeon: Angelia Mould, MD;  Location: [place];  Service: Vascular;  Laterality: Left;  . ULTRASOUND GUIDANCE FOR VASCULAR ACCESS Bilateral 09/24/2019   Procedure: Ultrasound Guidance For Vascular Access, Right Femoral Vein and Left Arm Graft;  Surgeon: Harold Barban  W, MD;  Location: [place];  Service: Vascular;  Laterality: Bilateral;  . VENOGRAM N/A 04/07/2013   Procedure: VENOGRAM;  Surgeon: Serafina Mitchell, MD;  Location: [facility] CATH LAB;  Service: Cardiovascular;  Laterality: N/A;     Current Outpatient Medications  Medication Sig Dispense Refill  . aspirin EC 81 MG tablet Take 1 tablet (81 mg total) by mouth daily. 30 tablet 11  . atorvastatin (LIPITOR) 40 MG tablet Take 40 mg by mouth daily.     . B Complex-C-Folic Acid (NEPHRO-VITE PO) Take 1 tablet by mouth daily.    . B Complex-C-Folic Acid (RENA-VITE RX) 1 MG TABS Take 1 tablet by mouth daily.    . calcium acetate (PHOSLO) 667 MG capsule Take 667 mg by mouth 3 (three) times daily with meals.     . cinacalcet (SENSIPAR) 30 MG tablet Take 4 tablets (120 mg total) by mouth daily with supper. 60 tablet   . colchicine 0.6 MG tablet colchicine 0.6 mg tablet  Take 1 tablet 6 times a day by oral route for 7 days.    . cyproheptadine (PERIACTIN) 2 MG/5ML syrup cyproheptadine 2 mg/5 mL oral syrup    . Darbepoetin Alfa (ARANESP) 100 MCG/0.5ML SOSY injection Inject 100 mcg into the skin every 7 (seven) days. Administered with dialysis    . diclofenac Sodium (VOLTAREN) 1 % GEL  diclofenac 1 % topical gel    . doxercalciferol (HECTOROL) 4 MCG/2ML injection Inject 2 mcg into the vein every Monday, Wednesday, and Friday with hemodialysis. Administered at dialysis    . ferric citrate (AURYXIA) 1 GM 210 MG(Fe) tablet Take 420 mg by mouth 3 (three) times daily with meals.     . ferric gluconate 125 mg in sodium chloride 0.9 % 100 mL Inject 250 mg into the vein every Monday, Wednesday, and Friday with hemodialysis.     [name] insulin detemir (LEVEMIR) 100 UNIT/ML injection Inject 0.2 mLs (20 Units total) into the skin 2 (two) times daily. 10 mL 11  . levETIRAcetam (KEPPRA) 500 MG tablet Take 2 tablets (1,000 mg total) by mouth daily. Then take extra 500 mg (1 tablet) on MWF after HD 204 tablet 3  . linaclotide (LINZESS) 145 MCG CAPS capsule Take 145 mcg by mouth daily.    . metoprolol tartrate (LOPRESSOR) 50 MG tablet Take 1 tablet (50 mg total) by mouth 2 (two) times daily. 180 tablet 3  . Nutritional Supplements (FEEDING SUPPLEMENT, NEPRO CARB STEADY,) LIQD Take 237 mLs by mouth 2 (two) times daily between meals.  0  . oxyCODONE-acetaminophen (PERCOCET/ROXICET) 5-325 MG tablet Take 1 tablet by mouth every 6 (six) hours as needed. 8 tablet 0  . oxyCODONE-acetaminophen (PERCOCET/ROXICET) 5-325 MG tablet Take 1 tablet by mouth every 6 (six) hours as needed. 6 tablet 0  . sennosides-docusate sodium (SENOKOT-S) 8.6-50 MG tablet Take 2 tablets by mouth 2 (two) times daily.    . sertraline (ZOLOFT) 25 MG tablet Take 25 mg by mouth at bedtime.     No current facility-administered medications for this visit.    Allergies:   Patient has no known allergies.    Social History:  The patient  reports that he quit smoking about 46 years ago. He quit after 7.00 years of use. He has never used smokeless tobacco. He reports that he does not drink alcohol or use drugs.   Family History:  The patient's ***family history includes Diabetes in his brother, sister, and son; Heart disease in his  father.  ROS:  Please see the history of present illness.   Otherwise, review of systems are positive for ***.   All other systems are reviewed and negative.    PHYSICAL EXAM: VS:  There were no vitals taken for this visit. , BMI There is no height or weight on file to calculate BMI. GEN: Well nourished, well developed, in no acute distress  HEENT: normal  Neck: no JVD, carotid bruits, or masses Cardiac: ***RRR; no murmurs, rubs, or gallops,no edema  Respiratory:  clear to auscultation bilaterally, normal work of breathing GI: soft, nontender, nondistended, + BS MS: no deformity or atrophy  Skin: warm and dry, no rash Neuro:  Strength and sensation are intact Psych: euthymic mood, full affect   EKG:   The ekg ordered today demonstrates ***   Recent Labs: 09/29/2019: ALT 7; BUN 25; Creatinine, Ser 5.78; Platelets 200 09/30/2019: Hemoglobin 12.6; Potassium 5.0; Sodium 135   Lipid Panel    Component Value Date/Time   CHOL 116 10/28/2016 0442   TRIG 92 10/28/2016 0442   HDL 48 10/28/2016 0442   CHOLHDL 2.4 10/28/2016 0442   VLDL 18 10/28/2016 0442   LDLCALC 50 10/28/2016 0442     Other studies Reviewed: Additional studies/ records that were reviewed today with results demonstrating: ***.   ASSESSMENT AND PLAN:  1. CAD:  2. Atrial flutter: 3. ESRD 4. Obesity: 5. Combined chronic heart failure:   Current medicines are reviewed at length with the patient today.  The patient concerns regarding his medicines were addressed.  The following changes have been made:  No change***  Labs/ tests ordered today include: *** No orders of the defined types were placed in this encounter.   Recommend 150 minutes/week of aerobic exercise Low fat, low carb, high fiber diet recommended  Disposition:   FU in ***   Signed, Larae Grooms, MD  10/18/2019 10:08 PM    [facility] [address], [place], [place]  [address] Phone: [phone]; Fax:  [phone]

## 2019-10-19 DIAGNOSIS — N2581 Secondary hyperparathyroidism of renal origin: Secondary | ICD-10-CM | POA: Diagnosis not present

## 2019-10-19 DIAGNOSIS — Z992 Dependence on renal dialysis: Secondary | ICD-10-CM | POA: Diagnosis not present

## 2019-10-19 DIAGNOSIS — E119 Type 2 diabetes mellitus without complications: Secondary | ICD-10-CM | POA: Diagnosis not present

## 2019-10-19 DIAGNOSIS — D509 Iron deficiency anemia, unspecified: Secondary | ICD-10-CM | POA: Diagnosis not present

## 2019-10-19 DIAGNOSIS — D631 Anemia in chronic kidney disease: Secondary | ICD-10-CM | POA: Diagnosis not present

## 2019-10-19 DIAGNOSIS — N186 End stage renal disease: Secondary | ICD-10-CM | POA: Diagnosis not present

## 2019-10-20 ENCOUNTER — Ambulatory Visit: Payer: Medicare Other | Admitting: Interventional Cardiology

## 2019-10-21 ENCOUNTER — Encounter: Payer: Self-pay | Admitting: Neurology

## 2019-10-21 DIAGNOSIS — Z992 Dependence on renal dialysis: Secondary | ICD-10-CM | POA: Diagnosis not present

## 2019-10-21 DIAGNOSIS — D509 Iron deficiency anemia, unspecified: Secondary | ICD-10-CM | POA: Diagnosis not present

## 2019-10-21 DIAGNOSIS — D631 Anemia in chronic kidney disease: Secondary | ICD-10-CM | POA: Diagnosis not present

## 2019-10-21 DIAGNOSIS — N2581 Secondary hyperparathyroidism of renal origin: Secondary | ICD-10-CM | POA: Diagnosis not present

## 2019-10-21 DIAGNOSIS — N186 End stage renal disease: Secondary | ICD-10-CM | POA: Diagnosis not present

## 2019-10-21 DIAGNOSIS — E119 Type 2 diabetes mellitus without complications: Secondary | ICD-10-CM | POA: Diagnosis not present

## 2019-10-21 NOTE — Progress Notes (Signed)
Virtual Visit via Video Note The purpose of this virtual visit is to provide medical care while limiting exposure to the novel coronavirus.    Consent was obtained for video visit:  Yes Answered questions that patient had about telehealth interaction:  Yes I discussed the limitations, risks, security and privacy concerns of performing an evaluation and management service by telemedicine. I also discussed with the patient that there may be a patient responsible charge related to this service. The patient expressed understanding and agreed to proceed.  Pt location: Home Physician Location: office Name of referring provider:  No ref. provider found I connected with Cristino Martes at patients initiation/request on 10/22/2019 at  1:30 PM EST by video enabled telemedicine application and verified that I am speaking with the correct person using two identifiers. Pt MRN:  [id number] Pt DOB:  1946-02-12 Video Participants:  Cristino Martes; his wife   History of Present Illness:  Ernest [name] is a 74 year old right-handed man with hyperlipidemia, ESRD on HD (M/W/F), type 2 diabetes mellitus, hypertension, COPD, gout, chronic CHF, atrial flutter, coronary artery disease, hepatitis C, penile implant (unable to get MRI) and history of stroke who follows up for seizures.  UPDATE: Current medication:  Keppra 1000mg  daily and 500mg  following dialysis.  However, he has only been taking once a day and twice after HD because he only received 180 tablets for 90 day supply (it should be 204 tablets).  No recent seizures.   He was seen in the ED on 09/29/2019 for constipation.  He was somnolent and confused.  CT head personally reviewed was negative for acute intracranial abnormality.  CT chest demonstrated loculated pleural effusion and underwent thoracentesis.  He has having falls due to pain in his left knee and ankle.  HISTORY: Around October 2015, he developed slurred speech.He also experienced  ringing in his right ear, with sensation of water in his right ear.He did not seek medical attention at the time.There was no facial droop or difficulty walking or swallowing.He decided to go to the ED on 09/21/14 for an evaluation as the slurred speech and tinnitus persisted.He denied weakness but ED exam revealed right arm weakness.CT of the head was performed, which revealed chronic small vessel ischemic changes, including remote right cerebellar, left pontine and right basal ganglia lacunar infarcts.No acute infarction was seen.  On 04/25/15, he developed right hand cramping and dystonia.When EMS arrived, he had jerking of the right hand that then generalized to a tonic-clonic seizure.He had two other seizures, in the ambulance and in the ED, lasting 30-40 seconds.CT of the head showed chronic small vessel disease and moderate brain atrophy.He was started on Keppra 500mg  twice daily.  He was admitted to [facility] on 04/29/15 for seizure, which was thought to be secondary to hyperglycemia with a blood glucose level of 662. MRI of the brain was withheld because he had a penile implant that contains metal. Keppra was increased to 1000mg  daily with 500mg  following each dialysis.  He was re-admitted to [facility] again on 07/11/15 for presumed acute right pontine infarct. He presented with diplopia and unsteady gait. On exam, he was found to have one and a half syndrome. CT of head and repeat CT of head did not reveal acute infarct. 2D echo showed EF 45-50% with grade 1 diastolic dysfunction. Carotid doppler showed no hemodynamically significant ICA stenosis  He was admitted to [facility] from 10/27/16 to 10/29/16 for 2 to 3 days of persistent left leg weakness. He  did not have other associated symptoms, such as other extremity weakness, numbness, visual disturbance, unsteady gait or worsening of residual dysarthria. Serum glucose was 263. CT of head was personally reviewed and  was negative for acute findings. Unable to obtain MRI due to metal penile implant, so repeat head the CT on the following day was also negative. Carotid doppler was negative for hemodynamically significant ICA stenosis. LDL was 50. Hgb A1c was elevated at 8.7. Weakness was thought to be due to hypotension during HD. He was advised to stop Lisinopril but otherwise continue is current regimen with dual antiplatelet therapy.  Past Medical History: Past Medical History:  Diagnosis Date  . Abnormal stress test    s/p cath November 2013 with modest disease involving the ostial left main, proximal LAD, proximal RCA - do not appear to be hemodynamically signficant; mild LV dysfunction  . Anemia    low iron  . Anxiety   . Arthritis   . Bacteremia   . Chronic systolic CHF (congestive heart failure) ([place])   . COPD (chronic obstructive pulmonary disease) ([place])   . Coronary artery disease    a. per cath report 2013, b. 09/13/2017-CABG X4 & Mitral valve repair  . Depression   . Ejection fraction < 50%   . Erectile dysfunction    penile implant  . ESRD (end stage renal disease) ([place])    Started HD in [place] in 2009, ESRD was due to DM. Moved to [facility] in Dec 2009 and now gets dialysis at [facility] on a MWF schedule.     . Gout    Hx: of  . Hepatitis C   . History of seizures   . Hyperlipidemia   . Hypertension   . Mitral valve disease    s/p Mitral repair 09/13/17  . Obesity   . Retinopathy   . Shortness of breath    Hx: of with exertion  . Stroke [facility])    does have weakness  . Type II or unspecified type diabetes mellitus without mention of complication, not stated as uncontrolled    adult onset    Medications: Outpatient Encounter Medications as of 10/22/2019  Medication Sig Note  . aspirin EC 81 MG tablet Take 1 tablet (81 mg total) by mouth daily. 09/22/2019: Pt not taking this medication  . atorvastatin (LIPITOR) 40 MG tablet Take 40 mg by mouth daily.    . B Complex-C-Folic Acid  (NEPHRO-VITE PO) Take 1 tablet by mouth daily.   . B Complex-C-Folic Acid (RENA-VITE RX) 1 MG TABS Take 1 tablet by mouth daily.   . calcium acetate (PHOSLO) 667 MG capsule Take 667 mg by mouth 3 (three) times daily with meals.    . cinacalcet (SENSIPAR) 30 MG tablet Take 4 tablets (120 mg total) by mouth daily with supper.   . colchicine 0.6 MG tablet colchicine 0.6 mg tablet  Take 1 tablet 6 times a day by oral route for 7 days.   . cyproheptadine (PERIACTIN) 2 MG/5ML syrup cyproheptadine 2 mg/5 mL oral syrup   . Darbepoetin Alfa (ARANESP) 100 MCG/0.5ML SOSY injection Inject 100 mcg into the skin every 7 (seven) days. Administered with dialysis   . diclofenac Sodium (VOLTAREN) 1 % GEL diclofenac 1 % topical gel   . doxercalciferol (HECTOROL) 4 MCG/2ML injection Inject 2 mcg into the vein every Monday, Wednesday, and Friday with hemodialysis. Administered at dialysis   . ferric citrate (AURYXIA) 1 GM 210 MG(Fe) tablet Take 420 mg by mouth 3 (  three) times daily with meals.    . ferric gluconate 125 mg in sodium chloride 0.9 % 100 mL Inject 250 mg into the vein every Monday, Wednesday, and Friday with hemodialysis.    [name] insulin detemir (LEVEMIR) 100 UNIT/ML injection Inject 0.2 mLs (20 Units total) into the skin 2 (two) times daily. 09/22/2019: Takes once a day in the AM per his wife  . levETIRAcetam (KEPPRA) 500 MG tablet Take 2 tablets (1,000 mg total) by mouth daily. Then take extra 500 mg (1 tablet) on MWF after HD 09/22/2019: Takes 1 tablet twice a day per wife  . linaclotide (LINZESS) 145 MCG CAPS capsule Take 145 mcg by mouth daily.   . metoprolol tartrate (LOPRESSOR) 50 MG tablet Take 1 tablet (50 mg total) by mouth 2 (two) times daily. 09/22/2019: Takes once a day per wife  . Nutritional Supplements (FEEDING SUPPLEMENT, NEPRO CARB STEADY,) LIQD Take 237 mLs by mouth 2 (two) times daily between meals.   [name] oxyCODONE-acetaminophen (PERCOCET/ROXICET) 5-325 MG tablet Take 1 tablet by mouth every 6  (six) hours as needed.   [name] oxyCODONE-acetaminophen (PERCOCET/ROXICET) 5-325 MG tablet Take 1 tablet by mouth every 6 (six) hours as needed.   . sennosides-docusate sodium (SENOKOT-S) 8.6-50 MG tablet Take 2 tablets by mouth 2 (two) times daily.   . sertraline (ZOLOFT) 25 MG tablet Take 25 mg by mouth at bedtime.    No facility-administered encounter medications on file as of 10/22/2019.    Allergies: [name]  Family History: Family History  Problem Relation Age of Onset  . Heart disease Father   . Diabetes Sister   . Diabetes Brother   . Diabetes Son     Social History: Social History   Socioeconomic History  . Marital status: Married    Spouse name: Not on file  . Number of children: Not on file  . Years of education: Not on file  . Highest education level: Not on file  Occupational History  . Occupation: Cabin crew: DISABLED    Comment: disabled  Tobacco Use  . Smoking status: Former Smoker    Years: 7.00    Quit date: 06/10/1973    Years since quitting: 46.3  . Smokeless tobacco: Never Used  . Tobacco comment: Quit in 1974.  Substance and Sexual Activity  . Alcohol use: No    Alcohol/week: 0.0 standard drinks  . Drug use: No  . Sexual activity: Not on file  Other Topics Concern  . Not on file  Social History Narrative   Adopted mentally retarded child at home, 2 adopted children, 3 living and 2 deceased.   Social Determinants of Health   Financial Resource Strain:   . Difficulty of Paying Living Expenses: Not on file  Food Insecurity:   . Worried About Charity fundraiser in the Last Year: Not on file  . Ran Out of Food in the Last Year: Not on file  Transportation Needs:   . Lack of Transportation (Medical): Not on file  . Lack of Transportation (Non-Medical): Not on file  Physical Activity:   . Days of Exercise per Week: Not on file  . Minutes of Exercise per Session: Not on file  Stress:   . Feeling of Stress : Not on file    Social Connections:   . Frequency of Communication with Friends and Family: Not on file  . Frequency of Social Gatherings with Friends and Family: Not on file  . Attends Religious  Services: Not on file  . Active Member of Clubs or Organizations: Not on file  . Attends Archivist Meetings: Not on file  . Marital Status: Not on file  Intimate Partner Violence:   . Fear of Current or Ex-Partner: Not on file  . Emotionally Abused: Not on file  . Physically Abused: Not on file  . Sexually Abused: Not on file    Observations/Objective:   Height 5\' 9"  (1.753 m). No acute distress.  Alert and oriented.  Speech fluent but dysarthric.  Assessment and Plan:   1.  Late-symptomatic seizure secondary to CVA 2.  History of CVA 3.  Type 2 diabetes mellitus 4.  Hypertension 5.  Hyperlipidemia 6.  ESRD on dialysis 7.  Coronary artery disease  1.  For seizure prophylaxis:  Refilled Keppra 1000mg  daily with 500mg  following dialysis. He will again be prescribed 204 tablets for 90 days.  If he receives 180 tablets, he will contact us. 2.  Secondary stroke prevention as managed by PCP:  ASA 81mg  daily  Statin therapy (LDL goal less than 70)  Blood pressure and glycemic control 3.  Follow up in one year  Follow Up Instructions:    -I discussed the assessment and treatment plan with the patient. The patient was provided an opportunity to ask questions and all were answered. The patient agreed with the plan and demonstrated an understanding of the instructions.   The patient was advised to call back or seek an in-person evaluation if the symptoms worsen or if the condition fails to improve as anticipated.  Dudley Major, DO

## 2019-10-22 ENCOUNTER — Telehealth (INDEPENDENT_AMBULATORY_CARE_PROVIDER_SITE_OTHER): Payer: Medicare Other | Admitting: Neurology

## 2019-10-22 ENCOUNTER — Other Ambulatory Visit: Payer: Self-pay

## 2019-10-22 VITALS — Ht 69.0 in

## 2019-10-22 DIAGNOSIS — N186 End stage renal disease: Secondary | ICD-10-CM

## 2019-10-22 DIAGNOSIS — E1122 Type 2 diabetes mellitus with diabetic chronic kidney disease: Secondary | ICD-10-CM

## 2019-10-22 DIAGNOSIS — E1165 Type 2 diabetes mellitus with hyperglycemia: Secondary | ICD-10-CM

## 2019-10-22 DIAGNOSIS — R569 Unspecified convulsions: Secondary | ICD-10-CM | POA: Diagnosis not present

## 2019-10-22 DIAGNOSIS — E785 Hyperlipidemia, unspecified: Secondary | ICD-10-CM | POA: Diagnosis not present

## 2019-10-22 DIAGNOSIS — I69398 Other sequelae of cerebral infarction: Secondary | ICD-10-CM

## 2019-10-22 DIAGNOSIS — I639 Cerebral infarction, unspecified: Secondary | ICD-10-CM

## 2019-10-22 DIAGNOSIS — I10 Essential (primary) hypertension: Secondary | ICD-10-CM | POA: Diagnosis not present

## 2019-10-22 DIAGNOSIS — IMO0002 Reserved for concepts with insufficient information to code with codable children: Secondary | ICD-10-CM

## 2019-10-23 DIAGNOSIS — E119 Type 2 diabetes mellitus without complications: Secondary | ICD-10-CM | POA: Diagnosis not present

## 2019-10-23 DIAGNOSIS — N2581 Secondary hyperparathyroidism of renal origin: Secondary | ICD-10-CM | POA: Diagnosis not present

## 2019-10-23 DIAGNOSIS — D509 Iron deficiency anemia, unspecified: Secondary | ICD-10-CM | POA: Diagnosis not present

## 2019-10-23 DIAGNOSIS — Z992 Dependence on renal dialysis: Secondary | ICD-10-CM | POA: Diagnosis not present

## 2019-10-23 DIAGNOSIS — N186 End stage renal disease: Secondary | ICD-10-CM | POA: Diagnosis not present

## 2019-10-23 DIAGNOSIS — D631 Anemia in chronic kidney disease: Secondary | ICD-10-CM | POA: Diagnosis not present

## 2019-10-26 DIAGNOSIS — N186 End stage renal disease: Secondary | ICD-10-CM | POA: Diagnosis not present

## 2019-10-26 DIAGNOSIS — Z992 Dependence on renal dialysis: Secondary | ICD-10-CM | POA: Diagnosis not present

## 2019-10-26 DIAGNOSIS — D509 Iron deficiency anemia, unspecified: Secondary | ICD-10-CM | POA: Diagnosis not present

## 2019-10-26 DIAGNOSIS — E119 Type 2 diabetes mellitus without complications: Secondary | ICD-10-CM | POA: Diagnosis not present

## 2019-10-26 DIAGNOSIS — N2581 Secondary hyperparathyroidism of renal origin: Secondary | ICD-10-CM | POA: Diagnosis not present

## 2019-10-26 DIAGNOSIS — D631 Anemia in chronic kidney disease: Secondary | ICD-10-CM | POA: Diagnosis not present

## 2019-10-28 DIAGNOSIS — E119 Type 2 diabetes mellitus without complications: Secondary | ICD-10-CM | POA: Diagnosis not present

## 2019-10-28 DIAGNOSIS — N186 End stage renal disease: Secondary | ICD-10-CM | POA: Diagnosis not present

## 2019-10-28 DIAGNOSIS — D631 Anemia in chronic kidney disease: Secondary | ICD-10-CM | POA: Diagnosis not present

## 2019-10-28 DIAGNOSIS — N2581 Secondary hyperparathyroidism of renal origin: Secondary | ICD-10-CM | POA: Diagnosis not present

## 2019-10-28 DIAGNOSIS — Z992 Dependence on renal dialysis: Secondary | ICD-10-CM | POA: Diagnosis not present

## 2019-10-28 DIAGNOSIS — D509 Iron deficiency anemia, unspecified: Secondary | ICD-10-CM | POA: Diagnosis not present

## 2019-10-29 LAB — FUNGAL ORGANISM REFLEX

## 2019-10-29 LAB — FUNGUS CULTURE WITH STAIN

## 2019-10-29 LAB — FUNGUS CULTURE RESULT

## 2019-10-30 DIAGNOSIS — D509 Iron deficiency anemia, unspecified: Secondary | ICD-10-CM | POA: Diagnosis not present

## 2019-10-30 DIAGNOSIS — N186 End stage renal disease: Secondary | ICD-10-CM | POA: Diagnosis not present

## 2019-10-30 DIAGNOSIS — E119 Type 2 diabetes mellitus without complications: Secondary | ICD-10-CM | POA: Diagnosis not present

## 2019-10-30 DIAGNOSIS — D631 Anemia in chronic kidney disease: Secondary | ICD-10-CM | POA: Diagnosis not present

## 2019-10-30 DIAGNOSIS — N2581 Secondary hyperparathyroidism of renal origin: Secondary | ICD-10-CM | POA: Diagnosis not present

## 2019-10-30 DIAGNOSIS — Z992 Dependence on renal dialysis: Secondary | ICD-10-CM | POA: Diagnosis not present

## 2019-11-02 DIAGNOSIS — D509 Iron deficiency anemia, unspecified: Secondary | ICD-10-CM | POA: Diagnosis not present

## 2019-11-02 DIAGNOSIS — E119 Type 2 diabetes mellitus without complications: Secondary | ICD-10-CM | POA: Diagnosis not present

## 2019-11-02 DIAGNOSIS — N2581 Secondary hyperparathyroidism of renal origin: Secondary | ICD-10-CM | POA: Diagnosis not present

## 2019-11-02 DIAGNOSIS — N186 End stage renal disease: Secondary | ICD-10-CM | POA: Diagnosis not present

## 2019-11-02 DIAGNOSIS — D631 Anemia in chronic kidney disease: Secondary | ICD-10-CM | POA: Diagnosis not present

## 2019-11-02 DIAGNOSIS — Z992 Dependence on renal dialysis: Secondary | ICD-10-CM | POA: Diagnosis not present

## 2019-11-04 DIAGNOSIS — Z992 Dependence on renal dialysis: Secondary | ICD-10-CM | POA: Diagnosis not present

## 2019-11-04 DIAGNOSIS — E119 Type 2 diabetes mellitus without complications: Secondary | ICD-10-CM | POA: Diagnosis not present

## 2019-11-04 DIAGNOSIS — D631 Anemia in chronic kidney disease: Secondary | ICD-10-CM | POA: Diagnosis not present

## 2019-11-04 DIAGNOSIS — N2581 Secondary hyperparathyroidism of renal origin: Secondary | ICD-10-CM | POA: Diagnosis not present

## 2019-11-04 DIAGNOSIS — D509 Iron deficiency anemia, unspecified: Secondary | ICD-10-CM | POA: Diagnosis not present

## 2019-11-04 DIAGNOSIS — N186 End stage renal disease: Secondary | ICD-10-CM | POA: Diagnosis not present

## 2019-11-06 DIAGNOSIS — N2581 Secondary hyperparathyroidism of renal origin: Secondary | ICD-10-CM | POA: Diagnosis not present

## 2019-11-06 DIAGNOSIS — Z992 Dependence on renal dialysis: Secondary | ICD-10-CM | POA: Diagnosis not present

## 2019-11-06 DIAGNOSIS — E119 Type 2 diabetes mellitus without complications: Secondary | ICD-10-CM | POA: Diagnosis not present

## 2019-11-06 DIAGNOSIS — N186 End stage renal disease: Secondary | ICD-10-CM | POA: Diagnosis not present

## 2019-11-06 DIAGNOSIS — D631 Anemia in chronic kidney disease: Secondary | ICD-10-CM | POA: Diagnosis not present

## 2019-11-06 DIAGNOSIS — D509 Iron deficiency anemia, unspecified: Secondary | ICD-10-CM | POA: Diagnosis not present

## 2019-11-09 DIAGNOSIS — E119 Type 2 diabetes mellitus without complications: Secondary | ICD-10-CM | POA: Diagnosis not present

## 2019-11-09 DIAGNOSIS — N186 End stage renal disease: Secondary | ICD-10-CM | POA: Diagnosis not present

## 2019-11-09 DIAGNOSIS — N2581 Secondary hyperparathyroidism of renal origin: Secondary | ICD-10-CM | POA: Diagnosis not present

## 2019-11-09 DIAGNOSIS — Z992 Dependence on renal dialysis: Secondary | ICD-10-CM | POA: Diagnosis not present

## 2019-11-09 DIAGNOSIS — D631 Anemia in chronic kidney disease: Secondary | ICD-10-CM | POA: Diagnosis not present

## 2019-11-09 DIAGNOSIS — D509 Iron deficiency anemia, unspecified: Secondary | ICD-10-CM | POA: Diagnosis not present

## 2019-11-11 DIAGNOSIS — Z992 Dependence on renal dialysis: Secondary | ICD-10-CM | POA: Diagnosis not present

## 2019-11-11 DIAGNOSIS — E119 Type 2 diabetes mellitus without complications: Secondary | ICD-10-CM | POA: Diagnosis not present

## 2019-11-11 DIAGNOSIS — D631 Anemia in chronic kidney disease: Secondary | ICD-10-CM | POA: Diagnosis not present

## 2019-11-11 DIAGNOSIS — N186 End stage renal disease: Secondary | ICD-10-CM | POA: Diagnosis not present

## 2019-11-11 DIAGNOSIS — N2581 Secondary hyperparathyroidism of renal origin: Secondary | ICD-10-CM | POA: Diagnosis not present

## 2019-11-11 DIAGNOSIS — E1129 Type 2 diabetes mellitus with other diabetic kidney complication: Secondary | ICD-10-CM | POA: Diagnosis not present

## 2019-11-11 DIAGNOSIS — D509 Iron deficiency anemia, unspecified: Secondary | ICD-10-CM | POA: Diagnosis not present

## 2019-11-13 LAB — ACID FAST CULTURE WITH REFLEXED SENSITIVITIES (MYCOBACTERIA): Acid Fast Culture: NEGATIVE

## 2019-11-14 DIAGNOSIS — D631 Anemia in chronic kidney disease: Secondary | ICD-10-CM | POA: Diagnosis not present

## 2019-11-14 DIAGNOSIS — E119 Type 2 diabetes mellitus without complications: Secondary | ICD-10-CM | POA: Diagnosis not present

## 2019-11-14 DIAGNOSIS — Z992 Dependence on renal dialysis: Secondary | ICD-10-CM | POA: Diagnosis not present

## 2019-11-14 DIAGNOSIS — N2581 Secondary hyperparathyroidism of renal origin: Secondary | ICD-10-CM | POA: Diagnosis not present

## 2019-11-14 DIAGNOSIS — N186 End stage renal disease: Secondary | ICD-10-CM | POA: Diagnosis not present

## 2019-11-14 DIAGNOSIS — D509 Iron deficiency anemia, unspecified: Secondary | ICD-10-CM | POA: Diagnosis not present

## 2019-11-16 ENCOUNTER — Telehealth: Payer: Self-pay

## 2019-11-16 DIAGNOSIS — E1129 Type 2 diabetes mellitus with other diabetic kidney complication: Secondary | ICD-10-CM | POA: Diagnosis not present

## 2019-11-16 DIAGNOSIS — N186 End stage renal disease: Secondary | ICD-10-CM | POA: Diagnosis not present

## 2019-11-16 DIAGNOSIS — D509 Iron deficiency anemia, unspecified: Secondary | ICD-10-CM | POA: Diagnosis not present

## 2019-11-16 DIAGNOSIS — Z992 Dependence on renal dialysis: Secondary | ICD-10-CM | POA: Diagnosis not present

## 2019-11-16 DIAGNOSIS — E119 Type 2 diabetes mellitus without complications: Secondary | ICD-10-CM | POA: Diagnosis not present

## 2019-11-16 DIAGNOSIS — N2581 Secondary hyperparathyroidism of renal origin: Secondary | ICD-10-CM | POA: Diagnosis not present

## 2019-11-16 DIAGNOSIS — D631 Anemia in chronic kidney disease: Secondary | ICD-10-CM | POA: Diagnosis not present

## 2019-11-16 DIAGNOSIS — Z23 Encounter for immunization: Secondary | ICD-10-CM | POA: Diagnosis not present

## 2019-11-16 NOTE — H&P (View-Only) (Signed)
Follow up Access Visit   History of Present Illness   Ernest [name] is a 74 y.o. year old male who presents for follow-up for: left upper arm arteriovenous graft (Date: 10/04/17).  He underwent a revision of his left arm AV graft on 09/24/19 because at the time of last office visit with Dr. Trula Slade he had developed thinning of the skin and eschar over top of his cannulation site. 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 Dr. Trula Slade was not able to cross the occlusion from the arm and so groin access was obtained. He was then able to cross the occlusion and performed balloon angioplasty to recanalize this area.  Residual stenosis was approximately 15%. He did not want to place a new graft with a known central venous occlusion because he thought long-term patency would not be very good and so he elected to place covered stents on the aneurysmal segments of his graft. This was to enable his wounds to heal without concerns for rupture. The patient's returns today with a proximal graft ulcerative eschar that has not healed.  He denies any pain in his arm. States most recent bleeding episode from area of ulceration was over 1 month ago. The patient notes coldness of his left hand that has been present since initial surgery in 2018. He denies that it is any worse.Otherwise no other steal symptoms and no difficulties with dialysis. He dialyses on MWF.  Physical Examination   Vitals:   11/17/19 0909  BP: (!) 108/59  Pulse: 72  Resp: 18  Temp: (!) 96.9 F (36.1 C)  TempSrc: Temporal  SpO2: 91%  Weight: 202 lb (91.6 kg)  Height: 5\' 9"  (1.753 m)   Body mass index is 29.83 kg/m.  General: Well nourished, in no acute discomfort Cardiac: Regular rate and rhythm Pulmonary: non labored respirations Extremities: Left upper extremity AV graft, thinning of skin overlying proximal aneurysmal area with eschar, no bleeding, no tenderness. Very superficial small  pinpoint eschars on more distal cannulation site. 2+ radial pulse, hand grip is 4/5, sensation in digits is intact, fingers cool, no cyanosis, no ulceration, good capillary refill. palpable thrill, bruit can be auscultated. No arm swelling. Right radial pulse 2+, right fingers cool, no swelling, 5/5 hand grip strength, no cyanosis or ulceration. 2+ femoral pulses, popliteal pulses not palpable, palpable AT and posterior tibial pulses    Medical Decision Making   Ernest [name] is a 74 y.o. year old male who presents s/p left upper arm arteriovenous graft revision 10/04/19 by Dr. Trula Slade. He returns for follow up because of nonhealing ulceration overlying cannulation site. He has no significant signs/ symptoms of steal syndrome. Patient has previously nonfunctional AV graft/fistula in his right upper extremity. Dr. Trula Slade previously was concerned about long term patency of placing a graft with his central venous occlusion. Today graft appears to be working well. Discussed patient with Dr. Donnetta Hutching who feels best option is for placement of new left AV graft with a temporary dialysis catheter. Discussed surgery with patient due to concerns of possible rupture of eschar, risks, benefits and alternatives were discussed with patient and questions were answered.  Patient scheduled for new left upper extremity AV graft with placement of TDC  Pt will have tunneled dialysis catheter and after 4 weeks if the AV graft has been used successfully to the satisfaction of the dialysis center, the tunneled catheter can be removed at their discretion   Ernest Caldwell, [name]  [facility] [place] Office: [phone]  Clinic MD: Dr. Donnetta Hutching

## 2019-11-16 NOTE — Telephone Encounter (Signed)
Spoke to [place] at [facility] regarding pt's "scab on graft" site. Made him an appt for tomorrow. Ald[facility] Bar is aware and will call him/his wife and set up transportation for this appt.

## 2019-11-16 NOTE — Progress Notes (Signed)
Follow up Access Visit   History of Present Illness   Ernest [name] is a 74 y.o. year old male who presents for follow-up for: left upper arm arteriovenous graft (Date: 10/04/17).  He underwent a revision of his left arm AV graft on 09/24/19 because at the time of last office visit with Dr. Trula Slade he had developed thinning of the skin and eschar over top of his cannulation site. 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 Dr. Trula Slade was not able to cross the occlusion from the arm and so groin access was obtained. He was then able to cross the occlusion and performed balloon angioplasty to recanalize this area.  Residual stenosis was approximately 15%. He did not want to place a new graft with a known central venous occlusion because he thought long-term patency would not be very good and so he elected to place covered stents on the aneurysmal segments of his graft. This was to enable his wounds to heal without concerns for rupture. The patient's returns today with a proximal graft ulcerative eschar that has not healed.  He denies any pain in his arm. States most recent bleeding episode from area of ulceration was over 1 month ago. The patient notes coldness of his left hand that has been present since initial surgery in 2018. He denies that it is any worse.Otherwise no other steal symptoms and no difficulties with dialysis. He dialyses on MWF.  Physical Examination   Vitals:   11/17/19 0909  BP: (!) 108/59  Pulse: 72  Resp: 18  Temp: (!) 96.9 F (36.1 C)  TempSrc: Temporal  SpO2: 91%  Weight: 202 lb (91.6 kg)  Height: 5\' 9"  (1.753 m)   Body mass index is 29.83 kg/m.  General: Well nourished, in no acute discomfort Cardiac: Regular rate and rhythm Pulmonary: non labored respirations Extremities: Left upper extremity AV graft, thinning of skin overlying proximal aneurysmal area with eschar, no bleeding, no tenderness. Very superficial small  pinpoint eschars on more distal cannulation site. 2+ radial pulse, hand grip is 4/5, sensation in digits is intact, fingers cool, no cyanosis, no ulceration, good capillary refill. palpable thrill, bruit can be auscultated. No arm swelling. Right radial pulse 2+, right fingers cool, no swelling, 5/5 hand grip strength, no cyanosis or ulceration. 2+ femoral pulses, popliteal pulses not palpable, palpable AT and posterior tibial pulses    Medical Decision Making   Ernest [name] is a 74 y.o. year old male who presents s/p left upper arm arteriovenous graft revision 10/04/19 by Dr. Trula Slade. He returns for follow up because of nonhealing ulceration overlying cannulation site. He has no significant signs/ symptoms of steal syndrome. Patient has previously nonfunctional AV graft/fistula in his right upper extremity. Dr. Trula Slade previously was concerned about long term patency of placing a graft with his central venous occlusion. Today graft appears to be working well. Discussed patient with Dr. Donnetta Hutching who feels best option is for placement of new left AV graft with a temporary dialysis catheter. Discussed surgery with patient due to concerns of possible rupture of eschar, risks, benefits and alternatives were discussed with patient and questions were answered.  Patient scheduled for new left upper extremity AV graft with placement of TDC  Pt will have tunneled dialysis catheter and after 4 weeks if the AV graft has been used successfully to the satisfaction of the dialysis center, the tunneled catheter can be removed at their discretion   Karoline Caldwell, [name]  [facility] [place] [place] Office: [phone]  Clinic MD: Dr. Donnetta Hutching

## 2019-11-17 ENCOUNTER — Ambulatory Visit (INDEPENDENT_AMBULATORY_CARE_PROVIDER_SITE_OTHER): Payer: Medicare Other | Admitting: Physician Assistant

## 2019-11-17 ENCOUNTER — Other Ambulatory Visit: Payer: Self-pay

## 2019-11-17 VITALS — BP 108/59 | HR 72 | Temp 96.9°F | Resp 18 | Ht 69.0 in | Wt 202.0 lb

## 2019-11-17 DIAGNOSIS — Z992 Dependence on renal dialysis: Secondary | ICD-10-CM | POA: Diagnosis not present

## 2019-11-17 DIAGNOSIS — I639 Cerebral infarction, unspecified: Secondary | ICD-10-CM

## 2019-11-17 DIAGNOSIS — N186 End stage renal disease: Secondary | ICD-10-CM | POA: Diagnosis not present

## 2019-11-18 DIAGNOSIS — E119 Type 2 diabetes mellitus without complications: Secondary | ICD-10-CM | POA: Diagnosis not present

## 2019-11-18 DIAGNOSIS — N2581 Secondary hyperparathyroidism of renal origin: Secondary | ICD-10-CM | POA: Diagnosis not present

## 2019-11-18 DIAGNOSIS — Z992 Dependence on renal dialysis: Secondary | ICD-10-CM | POA: Diagnosis not present

## 2019-11-18 DIAGNOSIS — D509 Iron deficiency anemia, unspecified: Secondary | ICD-10-CM | POA: Diagnosis not present

## 2019-11-18 DIAGNOSIS — N186 End stage renal disease: Secondary | ICD-10-CM | POA: Diagnosis not present

## 2019-11-18 DIAGNOSIS — D631 Anemia in chronic kidney disease: Secondary | ICD-10-CM | POA: Diagnosis not present

## 2019-11-20 DIAGNOSIS — D631 Anemia in chronic kidney disease: Secondary | ICD-10-CM | POA: Diagnosis not present

## 2019-11-20 DIAGNOSIS — N2581 Secondary hyperparathyroidism of renal origin: Secondary | ICD-10-CM | POA: Diagnosis not present

## 2019-11-20 DIAGNOSIS — N186 End stage renal disease: Secondary | ICD-10-CM | POA: Diagnosis not present

## 2019-11-20 DIAGNOSIS — D509 Iron deficiency anemia, unspecified: Secondary | ICD-10-CM | POA: Diagnosis not present

## 2019-11-20 DIAGNOSIS — E119 Type 2 diabetes mellitus without complications: Secondary | ICD-10-CM | POA: Diagnosis not present

## 2019-11-20 DIAGNOSIS — Z992 Dependence on renal dialysis: Secondary | ICD-10-CM | POA: Diagnosis not present

## 2019-11-23 DIAGNOSIS — E119 Type 2 diabetes mellitus without complications: Secondary | ICD-10-CM | POA: Diagnosis not present

## 2019-11-23 DIAGNOSIS — Z992 Dependence on renal dialysis: Secondary | ICD-10-CM | POA: Diagnosis not present

## 2019-11-23 DIAGNOSIS — D631 Anemia in chronic kidney disease: Secondary | ICD-10-CM | POA: Diagnosis not present

## 2019-11-23 DIAGNOSIS — D509 Iron deficiency anemia, unspecified: Secondary | ICD-10-CM | POA: Diagnosis not present

## 2019-11-23 DIAGNOSIS — N2581 Secondary hyperparathyroidism of renal origin: Secondary | ICD-10-CM | POA: Diagnosis not present

## 2019-11-23 DIAGNOSIS — N186 End stage renal disease: Secondary | ICD-10-CM | POA: Diagnosis not present

## 2019-11-24 ENCOUNTER — Encounter (HOSPITAL_COMMUNITY): Payer: Self-pay | Admitting: Surgery

## 2019-11-24 ENCOUNTER — Other Ambulatory Visit (HOSPITAL_COMMUNITY)
Admission: RE | Admit: 2019-11-24 | Discharge: 2019-11-24 | Disposition: A | Discharge status: Home / Self Care | Payer: Medicare Other | Source: Ambulatory Visit | Attending: Surgery | Admitting: Surgery

## 2019-11-24 ENCOUNTER — Other Ambulatory Visit: Payer: Self-pay

## 2019-11-24 DIAGNOSIS — Z20822 Contact with and (suspected) exposure to covid-19: Secondary | ICD-10-CM | POA: Diagnosis not present

## 2019-11-24 DIAGNOSIS — Z01812 Encounter for preprocedural laboratory examination: Secondary | ICD-10-CM | POA: Diagnosis not present

## 2019-11-24 LAB — SARS CORONAVIRUS 2 (TAT 6-24 HRS): SARS Coronavirus 2: NEGATIVE

## 2019-11-24 NOTE — Progress Notes (Signed)
Ernest [name] denies cheat pain or shortness of breath. Patient had Covid test today and it was negative. Patient quarantines with his wife, he will go to dialysis 11/25/2019 wearing a mask  Ernest [name] has type II diabetes, he does not have a CBG that works. I instructed patient to take 10 units Levemir if he fells that blood sugar is not low.

## 2019-11-25 DIAGNOSIS — D631 Anemia in chronic kidney disease: Secondary | ICD-10-CM | POA: Diagnosis not present

## 2019-11-25 DIAGNOSIS — Z992 Dependence on renal dialysis: Secondary | ICD-10-CM | POA: Diagnosis not present

## 2019-11-25 DIAGNOSIS — N2581 Secondary hyperparathyroidism of renal origin: Secondary | ICD-10-CM | POA: Diagnosis not present

## 2019-11-25 DIAGNOSIS — D509 Iron deficiency anemia, unspecified: Secondary | ICD-10-CM | POA: Diagnosis not present

## 2019-11-25 DIAGNOSIS — E119 Type 2 diabetes mellitus without complications: Secondary | ICD-10-CM | POA: Diagnosis not present

## 2019-11-25 DIAGNOSIS — N186 End stage renal disease: Secondary | ICD-10-CM | POA: Diagnosis not present

## 2019-11-26 ENCOUNTER — Encounter (HOSPITAL_COMMUNITY): Payer: Self-pay | Admitting: Surgery

## 2019-11-26 ENCOUNTER — Ambulatory Visit (HOSPITAL_COMMUNITY): Payer: Medicare Other

## 2019-11-26 ENCOUNTER — Ambulatory Visit (HOSPITAL_COMMUNITY): Payer: Medicare Other | Admitting: Anesthesiology

## 2019-11-26 ENCOUNTER — Ambulatory Visit (HOSPITAL_COMMUNITY)
Admission: RE | Admit: 2019-11-26 | Discharge: 2019-11-26 | Disposition: A | Discharge status: Home / Self Care | Payer: Medicare Other | Attending: Surgery | Admitting: Surgery

## 2019-11-26 ENCOUNTER — Encounter (HOSPITAL_COMMUNITY)
Admission: RE | Disposition: A | Discharge status: Home / Self Care | Payer: Self-pay | Source: Home / Self Care | Attending: Surgery

## 2019-11-26 ENCOUNTER — Other Ambulatory Visit: Payer: Self-pay

## 2019-11-26 DIAGNOSIS — L98499 Non-pressure chronic ulcer of skin of other sites with unspecified severity: Secondary | ICD-10-CM | POA: Insufficient documentation

## 2019-11-26 DIAGNOSIS — I5043 Acute on chronic combined systolic (congestive) and diastolic (congestive) heart failure: Secondary | ICD-10-CM | POA: Diagnosis not present

## 2019-11-26 DIAGNOSIS — Y841 Kidney dialysis as the cause of abnormal reaction of the patient, or of later complication, without mention of misadventure at the time of the procedure: Secondary | ICD-10-CM | POA: Diagnosis not present

## 2019-11-26 DIAGNOSIS — I110 Hypertensive heart disease with heart failure: Secondary | ICD-10-CM | POA: Diagnosis not present

## 2019-11-26 DIAGNOSIS — T82510A Breakdown (mechanical) of surgically created arteriovenous fistula, initial encounter: Secondary | ICD-10-CM | POA: Insufficient documentation

## 2019-11-26 DIAGNOSIS — Z992 Dependence on renal dialysis: Secondary | ICD-10-CM | POA: Diagnosis not present

## 2019-11-26 DIAGNOSIS — N186 End stage renal disease: Secondary | ICD-10-CM | POA: Insufficient documentation

## 2019-11-26 DIAGNOSIS — E1122 Type 2 diabetes mellitus with diabetic chronic kidney disease: Secondary | ICD-10-CM | POA: Diagnosis not present

## 2019-11-26 DIAGNOSIS — T82898A Other specified complication of vascular prosthetic devices, implants and grafts, initial encounter: Secondary | ICD-10-CM | POA: Diagnosis not present

## 2019-11-26 HISTORY — PX: INSERTION OF DIALYSIS CATHETER: SHX1324

## 2019-11-26 HISTORY — DX: Acute embolism and thrombosis of unspecified deep veins of unspecified lower extremity: I82.409

## 2019-11-26 HISTORY — PX: AV FISTULA PLACEMENT: SHX1204

## 2019-11-26 LAB — GLUCOSE, CAPILLARY
Glucose-Capillary: 99 mg/dL (ref 70–99)
Glucose-Capillary: 98 mg/dL (ref 70–99)
Glucose-Capillary: 97 mg/dL (ref 70–99)

## 2019-11-26 LAB — POCT I-STAT, CHEM 8
BUN: 27 mg/dL — ABNORMAL HIGH (ref 8–23)
Calcium, Ion: 1.06 mmol/L — ABNORMAL LOW (ref 1.15–1.40)
Chloride: 93 mmol/L — ABNORMAL LOW (ref 98–111)
Creatinine, Ser: 4.1 mg/dL — ABNORMAL HIGH (ref 0.61–1.24)
Glucose, Bld: 125 mg/dL — ABNORMAL HIGH (ref 70–99)
HCT: 33 % — ABNORMAL LOW (ref 39.0–52.0)
Hemoglobin: 11.2 g/dL — ABNORMAL LOW (ref 13.0–17.0)
Potassium: 4 mmol/L (ref 3.5–5.1)
Sodium: 138 mmol/L (ref 135–145)
TCO2: 35 mmol/L — ABNORMAL HIGH (ref 22–32)

## 2019-11-26 SURGERY — INSERTION OF ARTERIOVENOUS (AV) GORE-TEX GRAFT ARM
Anesthesia: General | Site: Neck | Laterality: Right

## 2019-11-26 MED ORDER — SODIUM CHLORIDE 0.9 % IV SOLN
INTRAVENOUS | Status: DC | PRN
  Administered 2019-11-26: 500 mL

## 2019-11-26 MED ORDER — FENTANYL CITRATE (PF) 250 MCG/5ML IJ SOLN
INTRAMUSCULAR | Status: AC
  Filled 2019-11-26: qty 5

## 2019-11-26 MED ORDER — OXYCODONE-ACETAMINOPHEN 5-325 MG PO TABS: 1.0000 | ORAL_TABLET | Freq: Four times a day (QID) | ORAL | 0 refills | Status: DC | PRN

## 2019-11-26 MED ORDER — HEMOSTATIC AGENTS (NO CHARGE) OPTIME
TOPICAL | Status: DC | PRN
  Administered 2019-11-26: 1 via TOPICAL

## 2019-11-26 MED ORDER — LIDOCAINE-EPINEPHRINE 1 %-1:100000 IJ SOLN
INTRAMUSCULAR | Status: AC
  Filled 2019-11-26: qty 1

## 2019-11-26 MED ORDER — ONDANSETRON HCL 4 MG/2ML IJ SOLN
INTRAMUSCULAR | Status: DC | PRN
  Administered 2019-11-26: 4 mg via INTRAVENOUS

## 2019-11-26 MED ORDER — HEPARIN SODIUM (PORCINE) 1000 UNIT/ML IJ SOLN
INTRAMUSCULAR | Status: AC
  Filled 2019-11-26: qty 1

## 2019-11-26 MED ORDER — LIDOCAINE 2% (20 MG/ML) 5 ML SYRINGE
INTRAMUSCULAR | Status: DC | PRN
  Administered 2019-11-26: 100 mg via INTRAVENOUS

## 2019-11-26 MED ORDER — ONDANSETRON HCL 4 MG/2ML IJ SOLN
INTRAMUSCULAR | Status: AC
  Filled 2019-11-26: qty 2

## 2019-11-26 MED ORDER — LIDOCAINE 2% (20 MG/ML) 5 ML SYRINGE
INTRAMUSCULAR | Status: AC
  Filled 2019-11-26: qty 10

## 2019-11-26 MED ORDER — PROPOFOL 10 MG/ML IV BOLUS
INTRAVENOUS | Status: DC | PRN
  Administered 2019-11-26: 140 mg via INTRAVENOUS

## 2019-11-26 MED ORDER — CEFAZOLIN SODIUM-DEXTROSE 2-4 GM/100ML-% IV SOLN
2.0000 g | INTRAVENOUS | Status: AC
  Administered 2019-11-26: 2 g via INTRAVENOUS
  Filled 2019-11-26: qty 100

## 2019-11-26 MED ORDER — CHLORHEXIDINE GLUCONATE 4 % EX LIQD: 60.0000 mL | Freq: Once | CUTANEOUS | Status: DC

## 2019-11-26 MED ORDER — FENTANYL CITRATE (PF) 100 MCG/2ML IJ SOLN
INTRAMUSCULAR | Status: DC | PRN
  Administered 2019-11-26 (×2): 25 ug via INTRAVENOUS

## 2019-11-26 MED ORDER — PHENYLEPHRINE HCL-NACL 10-0.9 MG/250ML-% IV SOLN
INTRAVENOUS | Status: DC | PRN
  Administered 2019-11-26: 50 ug/min via INTRAVENOUS

## 2019-11-26 MED ORDER — 0.9 % SODIUM CHLORIDE (POUR BTL) OPTIME
TOPICAL | Status: DC | PRN
  Administered 2019-11-26: 1000 mL

## 2019-11-26 MED ORDER — SODIUM CHLORIDE 0.9 % IV SOLN: INTRAVENOUS | Status: DC

## 2019-11-26 MED ORDER — IODIXANOL 320 MG/ML IV SOLN
INTRAVENOUS | Status: DC | PRN
  Administered 2019-11-26: 10:00:00 5 mL

## 2019-11-26 MED ORDER — HEPARIN SODIUM (PORCINE) 1000 UNIT/ML IJ SOLN
INTRAMUSCULAR | Status: DC | PRN
  Administered 2019-11-26: 3400 [IU]

## 2019-11-26 MED ORDER — SODIUM CHLORIDE 0.9 % IV SOLN
INTRAVENOUS | Status: AC
  Filled 2019-11-26: qty 1.2

## 2019-11-26 MED ORDER — FENTANYL CITRATE (PF) 100 MCG/2ML IJ SOLN: 25.0000 ug | INTRAMUSCULAR | Status: DC | PRN

## 2019-11-26 SURGICAL SUPPLY — 66 items
ARMBAND PINK RESTRICT EXTREMIT (MISCELLANEOUS) ×6 IMPLANT
BAG BANDED W/RUBBER/TAPE 36X54 (MISCELLANEOUS) ×2 IMPLANT
BAG DECANTER FOR FLEXI CONT (MISCELLANEOUS) ×2 IMPLANT
BIOPATCH RED 1 DISK 7.0 (GAUZE/BANDAGES/DRESSINGS) ×3 IMPLANT
BIOPATCH RED 1IN DISK 7.0MM (GAUZE/BANDAGES/DRESSINGS) ×1
CANISTER SUCT 3000ML PPV (MISCELLANEOUS) ×4 IMPLANT
CATH BEACON 5 .035 65 KMP TIP (CATHETERS) ×4 IMPLANT
CATH PALINDROME RT-P 15FX19CM (CATHETERS) IMPLANT
CATH PALINDROME RT-P 15FX23CM (CATHETERS) ×2 IMPLANT
CATH PALINDROME RT-P 15FX28CM (CATHETERS) IMPLANT
CATH PALINDROME RT-P 15FX55CM (CATHETERS) IMPLANT
CLIP VESOCCLUDE MED 6/CT (CLIP) ×4 IMPLANT
CLIP VESOCCLUDE SM WIDE 6/CT (CLIP) ×4 IMPLANT
COVER DOME SNAP 22 D (MISCELLANEOUS) ×2 IMPLANT
COVER PROBE W GEL 5X96 (DRAPES) ×4 IMPLANT
COVER SURGICAL LIGHT HANDLE (MISCELLANEOUS) ×2 IMPLANT
COVER WAND RF STERILE (DRAPES) ×2 IMPLANT
DERMABOND ADHESIVE PROPEN (GAUZE/BANDAGES/DRESSINGS)
DERMABOND ADVANCED (GAUZE/BANDAGES/DRESSINGS) ×4
DERMABOND ADVANCED .7 DNX12 (GAUZE/BANDAGES/DRESSINGS) ×2 IMPLANT
DERMABOND ADVANCED .7 DNX6 (GAUZE/BANDAGES/DRESSINGS) IMPLANT
DRAPE C-ARM 42X72 X-RAY (DRAPES) ×2 IMPLANT
DRAPE CHEST BREAST 15X10 FENES (DRAPES) ×2 IMPLANT
DRAPE ORTHO SPLIT 77X108 STRL (DRAPES) ×2
DRAPE SURG ORHT 6 SPLT 77X108 (DRAPES) IMPLANT
ELECT REM PT RETURN 9FT ADLT (ELECTROSURGICAL) ×4
ELECTRODE REM PT RTRN 9FT ADLT (ELECTROSURGICAL) ×2 IMPLANT
GAUZE 4X4 16PLY RFD (DISPOSABLE) ×2 IMPLANT
GLOVE BIOGEL PI IND STRL 7.5 (GLOVE) ×2 IMPLANT
GLOVE BIOGEL PI INDICATOR 7.5 (GLOVE) ×2
GLOVE SURG SS PI 7.5 STRL IVOR (GLOVE) ×4 IMPLANT
GOWN STRL REUS W/ TWL LRG LVL3 (GOWN DISPOSABLE) ×4 IMPLANT
GOWN STRL REUS W/ TWL XL LVL3 (GOWN DISPOSABLE) ×2 IMPLANT
GOWN STRL REUS W/TWL LRG LVL3 (GOWN DISPOSABLE) ×4
GOWN STRL REUS W/TWL XL LVL3 (GOWN DISPOSABLE) ×2
GRAFT GORETEX STRT 6X50 (Vascular Products) ×2 IMPLANT
HEMOSTAT SNOW SURGICEL 2X4 (HEMOSTASIS) ×2 IMPLANT
KIT BASIN OR (CUSTOM PROCEDURE TRAY) ×4 IMPLANT
KIT TURNOVER KIT B (KITS) ×4 IMPLANT
NDL 18GX1X1/2 (RX/OR ONLY) (NEEDLE) ×2 IMPLANT
NDL HYPO 25GX1X1/2 BEV (NEEDLE) ×2 IMPLANT
NEEDLE 18GX1X1/2 (RX/OR ONLY) (NEEDLE) ×4 IMPLANT
NEEDLE HYPO 25GX1X1/2 BEV (NEEDLE) IMPLANT
NS IRRIG 1000ML POUR BTL (IV SOLUTION) ×4 IMPLANT
PACK CV ACCESS (CUSTOM PROCEDURE TRAY) ×4 IMPLANT
PACK SURGICAL SETUP 50X90 (CUSTOM PROCEDURE TRAY) ×2 IMPLANT
PAD ARMBOARD 7.5X6 YLW CONV (MISCELLANEOUS) ×8 IMPLANT
SOAP 2 % CHG 4 OZ (WOUND CARE) ×4 IMPLANT
SPONGE LAP 18X18 RF (DISPOSABLE) ×2 IMPLANT
SUT ETHILON 3 0 PS 1 (SUTURE) ×4 IMPLANT
SUT ETHILON 4 0 PS 2 18 (SUTURE) ×2 IMPLANT
SUT PROLENE 6 0 BV (SUTURE) ×10 IMPLANT
SUT VIC AB 3-0 SH 27 (SUTURE) ×4
SUT VIC AB 3-0 SH 27X BRD (SUTURE) ×4 IMPLANT
SUT VICRYL 4-0 PS2 18IN ABS (SUTURE) ×8 IMPLANT
SYR 10ML LL (SYRINGE) ×8 IMPLANT
SYR 20ML LL LF (SYRINGE) ×4 IMPLANT
SYR 5ML LL (SYRINGE) ×2 IMPLANT
SYR CONTROL 10ML LL (SYRINGE) ×2 IMPLANT
SYR TOOMEY 50ML (SYRINGE) IMPLANT
TOWEL GREEN STERILE (TOWEL DISPOSABLE) ×8 IMPLANT
TOWEL GREEN STERILE FF (TOWEL DISPOSABLE) ×4 IMPLANT
UNDERPAD 30X30 (UNDERPADS AND DIAPERS) ×4 IMPLANT
WATER STERILE IRR 1000ML POUR (IV SOLUTION) ×4 IMPLANT
WIRE AMPLATZ SS-J .035X180CM (WIRE) ×2 IMPLANT
WIRE BENTSON .035X145CM (WIRE) ×2 IMPLANT

## 2019-11-26 NOTE — Anesthesia Postprocedure Evaluation (Signed)
Anesthesia Post Note  Patient: Ernest [name]  Procedure(s) Performed: INSERTION OF ARTERIOVENOUS (AV) GORE-TEX GRAFT, left  ARM (Left Arm Upper) INSERTION OF DIALYSIS CATHETER, right internal jugular (Right Neck)     Patient location during evaluation: PACU Anesthesia Type: General Level of consciousness: awake Pain management: pain level controlled Vital Signs Assessment: post-procedure vital signs reviewed and stable Respiratory status: spontaneous breathing Cardiovascular status: stable Postop Assessment: no apparent nausea or vomiting Anesthetic complications: no    Last Vitals:  Vitals:   11/26/19 0712 11/26/19 1200  BP: (!) 144/54 115/71  Pulse: 64 71  Resp: 17 (!) 8  Temp: 36.9 C 36.5 C  SpO2: 99% 99%    Last Pain:  Vitals:   11/26/19 1200  TempSrc:   PainSc: 0-No pain                 Delainee Tramel

## 2019-11-26 NOTE — Anesthesia Postprocedure Evaluation (Signed)
Anesthesia Post Note  Patient: AZHAR SUBIA  Procedure(s) Performed: INSERTION OF ARTERIOVENOUS (AV) GORE-TEX GRAFT, left  ARM (Left Arm Upper) INSERTION OF DIALYSIS CATHETER, right internal jugular (Right Neck)     Patient location during evaluation: PACU Anesthesia Type: General Level of consciousness: awake Pain management: pain level controlled Vital Signs Assessment: post-procedure vital signs reviewed and stable Respiratory status: spontaneous breathing Cardiovascular status: stable Postop Assessment: no apparent nausea or vomiting Anesthetic complications: no    Last Vitals:  Vitals:   11/26/19 0712 11/26/19 1200  BP: (!) 144/54 115/71  Pulse: 64 71  Resp: 17 (!) 8  Temp: 36.9 C 36.5 C  SpO2: 99% 99%    Last Pain:  Vitals:   11/26/19 1200  TempSrc:   PainSc: 0-No pain                 Nataliyah Packham

## 2019-11-26 NOTE — Discharge Instructions (Signed)
° °  [facility] ° °Discharge Instructions ° °AV Fistula or Graft Surgery for Dialysis Access ° °Please refer to the following instructions for your post-procedure care. Your surgeon or physician assistant will discuss any changes with you. ° °Activity ° °You may drive the day following your surgery, if you are comfortable and no longer taking prescription pain medication. Resume full activity as the soreness in your incision resolves. ° °Bathing/Showering ° °You may shower after you go home. Keep your incision dry for 48 hours. Do not soak in a bathtub, hot tub, or swim until the incision heals completely. You may not shower if you have a hemodialysis catheter. ° °Incision Care ° °Clean your incision with mild soap and water after 48 hours. Pat the area dry with a clean towel. You do not need a bandage unless otherwise instructed. Do not apply any ointments or creams to your incision. You may have skin glue on your incision. Do not peel it off. It will come off on its own in about one week. Your arm may swell a bit after surgery. To reduce swelling use pillows to elevate your arm so it is above your heart. Your doctor will tell you if you need to lightly wrap your arm with an ACE bandage. ° °Diet ° °Resume your normal diet. There are not special food restrictions following this procedure. In order to heal from your surgery, it is CRITICAL to get adequate nutrition. Your body requires vitamins, minerals, and protein. Vegetables are the best source of vitamins and minerals. Vegetables also provide the perfect balance of protein. Processed food has little nutritional value, so try to avoid this. ° °Medications ° °Resume taking all of your medications. If your incision is causing pain, you may take over-the counter pain relievers such as acetaminophen (Tylenol). If you were prescribed a stronger pain medication, please be aware these medications can cause nausea and constipation. Prevent  nausea by taking the medication with a snack or meal. Avoid constipation by drinking plenty of fluids and eating foods with high amount of fiber, such as fruits, vegetables, and grains. Do not take Tylenol if you are taking prescription pain medications. ° ° ° ° °Follow up °Your surgeon may want to see you in the office following your access surgery. If so, this will be arranged at the time of your surgery. ° °Please call us immediately for any of the following conditions: ° °Increased pain, redness, drainage (pus) from your incision site °Fever of 101 degrees or higher °Severe or worsening pain at your incision site °Hand pain or numbness. ° °Reduce your risk of vascular disease: ° °Stop smoking. If you would like help, call [facility] at 1-800-QUIT-NOW (1-800-784-8669) or [facility] at 336-586-4000 ° °Manage your cholesterol °Maintain a desired weight °Control your diabetes °Keep your blood pressure down ° °Dialysis ° °It will take several weeks to several months for your new dialysis access to be ready for use. Your surgeon will determine when it is OK to use it. Your nephrologist will continue to direct your dialysis. You can continue to use your Permcath until your new access is ready for use. ° °If you have any questions, please call the office at [phone]. ° °

## 2019-11-26 NOTE — Anesthesia Procedure Notes (Signed)
Procedure Name: Intubation Date/Time: 11/26/2019 9:37 AM Performed by: Kyung Rudd, CRNA Pre-anesthesia Checklist: Patient identified, Emergency Drugs available, Suction available and Patient being monitored Patient Re-evaluated:Patient Re-evaluated prior to induction Oxygen Delivery Method: Circle system utilized Preoxygenation: Pre-oxygenation with 100% oxygen Induction Type: IV induction LMA: LMA inserted LMA Size: 4.0 Laser Tube: Cuffed inflated with minimal occlusive pressure - saline Number of attempts: 1 Placement Confirmation: positive ETCO2 and breath sounds checked- equal and bilateral Tube secured with: Tape Dental Injury: Teeth and Oropharynx as per pre-operative assessment

## 2019-11-26 NOTE — Interval H&P Note (Signed)
History and Physical Interval Note:  11/26/2019 8:48 AM  Ernest [name]  has presented today for surgery, with the diagnosis of END STAGE RENAL DISEASE.  The various methods of treatment have been discussed with the patient and family. After consideration of risks, benefits and other options for treatment, the patient has consented to  Procedure(s): INSERTION OF ARTERIOVENOUS (AV) GORE-TEX GRAFT ARM (Left) INSERTION OF DIALYSIS CATHETER (N/A) as a surgical intervention.  The patient's history has been reviewed, patient examined, no change in status, stable for surgery.  I have reviewed the patient's chart and labs.  Questions were answered to the patient's satisfaction.     [name]

## 2019-11-26 NOTE — Anesthesia Postprocedure Evaluation (Signed)
Anesthesia Post Note  Patient: TAMERON MITCHENER  Procedure(s) Performed: INSERTION OF ARTERIOVENOUS (AV) GORE-TEX GRAFT, left  ARM (Left Arm Upper) INSERTION OF DIALYSIS CATHETER, right internal jugular (Right Neck)     Patient location during evaluation: PACU Anesthesia Type: General Level of consciousness: awake Pain management: pain level controlled Vital Signs Assessment: post-procedure vital signs reviewed and stable Respiratory status: spontaneous breathing Cardiovascular status: stable Postop Assessment: no apparent nausea or vomiting Anesthetic complications: no    Last Vitals:  Vitals:   11/26/19 1315 11/26/19 1329  BP: (!) 107/58   Pulse: 64 67  Resp: 16   Temp:  (!) 36.1 C  SpO2: 99% 93%    Last Pain:  Vitals:   11/26/19 1315  TempSrc:   PainSc: 0-No pain                 Savyon Loken

## 2019-11-26 NOTE — Op Note (Signed)
Patient name: Ernest [name] MRN: [id number] DOB: Jun 24, 1946 Sex: male  11/26/2019 Pre-operative Diagnosis: End-stage renal disease Post-operative diagnosis:  Same Surgeon:  [name] Assistants: Risa Grill Procedure:   #1: Ultrasound-guided placement of right internal jugular vein tunneled dialysis catheter   #2: Central venogram   #3: Redo left upper extremity AV Gore-Tex graft (6 mm   #4: Resection of necrotic skin ulcer (6 x 2 x 1 cm) Anesthesia: General Blood Loss: Minimal Specimens: None  Findings: Catheter tip at cavoatrial junction.  The proximal anastomosis of the graft was to a section of the old graft.  The distal anastomosis was end to end to the axillary vein.  Indications: The patient recently underwent recanalization of an occluded central venous system and Viabahn placement within a left upper arm Gore-Tex graft for ulcers.  He has not healed his ulcerations comes in today for new graft and catheter  Procedure:  The patient was identified in the holding area and taken to [place] [facility]  The patient was then placed supine on the table. general anesthesia was administered.  The patient was prepped and draped in the usual sterile fashion.  A time out was called and antibiotics were administered.  Ultrasound was used to evaluate the right internal jugular vein which appeared to be widely patent.  A #11 blade was used to make a skin nick.  The right internal jugular vein was then cannulated on ultrasound guidance with an 18-gauge needle.  I had difficulty advancing a Bentson wire, and so a Berenstein catheter was inserted into the superior vena cava and a central venogram was performed which showed contrast filling the superior vena cava into the right atrium.  The Berenstein catheter and Bentson wire were used to navigate the wire into the inferior vena cava.  Next the Bentson wire was removed and an Amplatz wire was placed.  The subcutaneous tract was dilated with sequential  dilators and the peel-away sheath was placed.  A 23 cm palindrome catheter was then inserted over the wire into the inferior vena cava.  The peel-away sheath was removed.  A skin exit site was then selected.  A #11 blade was used to make a skin nick and a subcutaneous tunnel was created.  The catheter was then brought through the tunnel and the cuff was situated the skin exit site.  Fluoroscopy confirmed that the catheter tip was at the cavoatrial junction.  The catheter was then connected to the hub.  Both ports flushed and aspirated without difficulty.  The catheter sutured in position with 3-0 nylon in the skin incision and the neck was closed with 4-0 Vicryl  Attention was then turned towards the left arm.  An incision was made over top of the old graft near the anastomosis.  I got circumferential control of the graft at this level.  I then opened the previous axillary incision and dissected out the graft at this level.  A curved Gore tunneler was then used to create a new tunnel going outside of the existing graft.  A 6 mm Gore-Tex graft was brought through the tunnel.  I then transected the old graft just beyond the arterial anastomosis and performed a end-to-end anastomosis between the new graft and the old graft with 6-0 Prolene.  Clamps were released and there was excellent flow through the new graft.  I then occluded the axillary vein and transected the old graft anastomosis to the vein.  I removed approximately 1 cm of  the vein.  The vein was very healthy at this level.  A end to end anastomosis was then created with 6-0 Prolene.  Clamps were then released.  There was a good thrill within the graft.  The more distal aneurysmal segment of the old graft was excised with an elliptical incision.  This was approximately a 6 x 2 cm incision.  The underlying old graft was removed.  At this point the wounds were irrigated.  Hemostasis was achieved.  The incision for the new grafts were closed with 2 layers of  3-0 Vicryl.  The skin excision site was closed by reapproximating the subcutaneous tissue with 3-0 Vicryl.  Nylon suture was used to close the skin.  There were no immediate complications.   Disposition: To PACU stable.   Theotis Burrow, M.D., [facility] [place] Office: [phone] Pager:  [phone]

## 2019-11-26 NOTE — Anesthesia Preprocedure Evaluation (Addendum)
Anesthesia Evaluation  Patient identified by MRN, date of birth, ID band Patient awake    Reviewed: Allergy & Precautions, NPO status , Patient's Chart, lab work & pertinent test results  Airway Mallampati: II  TM Distance: >3 FB     Dental   Pulmonary shortness of breath, sleep apnea , COPD, former smoker,    breath sounds clear to auscultation       Cardiovascular hypertension, + CAD, + Past MI and +CHF   Rhythm:Regular Rate:Normal     Neuro/Psych Seizures -,  Anxiety Depression Dementia TIA Neuromuscular disease CVA    GI/Hepatic negative GI ROS, (+) Hepatitis -  Endo/Other  diabetes  Renal/GU Renal disease     Musculoskeletal  (+) Arthritis ,   Abdominal   Peds  Hematology  (+) anemia ,   Anesthesia Other Findings   Reproductive/Obstetrics                            Anesthesia Physical Anesthesia Plan  ASA: III  Anesthesia Plan: General   Post-op Pain Management:    Induction: Intravenous  PONV Risk Score and Plan: 2 and Ondansetron, Dexamethasone and Midazolam  Airway Management Planned: LMA  Additional Equipment:   Intra-op Plan:   Post-operative Plan: Extubation in OR  Informed Consent: I have reviewed the patients History and Physical, chart, labs and discussed the procedure including the risks, benefits and alternatives for the proposed anesthesia with the patient or authorized representative who has indicated his/her understanding and acceptance.     Dental advisory given  Plan Discussed with: CRNA and Anesthesiologist  Anesthesia Plan Comments:         Anesthesia Quick Evaluation

## 2019-11-26 NOTE — Transfer of Care (Signed)
Immediate Anesthesia Transfer of Care Note  Patient: Ernest [name]  Procedure(s) Performed: INSERTION OF ARTERIOVENOUS (AV) GORE-TEX GRAFT, left  ARM (Left Arm Upper) INSERTION OF DIALYSIS CATHETER, right internal jugular (Right Neck)  Patient Location: PACU  Anesthesia Type:General  Level of Consciousness: awake, alert  and oriented  Airway & Oxygen Therapy: Patient Spontanous Breathing and Patient connected to nasal cannula oxygen  Post-op Assessment: Report given to RN, Post -op Vital signs reviewed and stable and Patient moving all extremities  Post vital signs: Reviewed and stable  Last Vitals:  Vitals Value Taken Time  BP 115/71 11/26/19 1159  Temp    Pulse 72 11/26/19 1201  Resp 8 11/26/19 1201  SpO2 99 % 11/26/19 1201  Vitals shown include unvalidated device data.  Last Pain:  Vitals:   11/26/19 0820  TempSrc:   PainSc: 0-No pain      Patients Stated Pain Goal: 0 ([id number])  Complications: No apparent anesthesia complications

## 2019-11-27 DIAGNOSIS — D631 Anemia in chronic kidney disease: Secondary | ICD-10-CM | POA: Diagnosis not present

## 2019-11-27 DIAGNOSIS — N2581 Secondary hyperparathyroidism of renal origin: Secondary | ICD-10-CM | POA: Diagnosis not present

## 2019-11-27 DIAGNOSIS — N186 End stage renal disease: Secondary | ICD-10-CM | POA: Diagnosis not present

## 2019-11-27 DIAGNOSIS — Z992 Dependence on renal dialysis: Secondary | ICD-10-CM | POA: Diagnosis not present

## 2019-11-27 DIAGNOSIS — D509 Iron deficiency anemia, unspecified: Secondary | ICD-10-CM | POA: Diagnosis not present

## 2019-11-27 DIAGNOSIS — E119 Type 2 diabetes mellitus without complications: Secondary | ICD-10-CM | POA: Diagnosis not present

## 2019-11-30 DIAGNOSIS — D631 Anemia in chronic kidney disease: Secondary | ICD-10-CM | POA: Diagnosis not present

## 2019-11-30 DIAGNOSIS — N2581 Secondary hyperparathyroidism of renal origin: Secondary | ICD-10-CM | POA: Diagnosis not present

## 2019-11-30 DIAGNOSIS — D509 Iron deficiency anemia, unspecified: Secondary | ICD-10-CM | POA: Diagnosis not present

## 2019-11-30 DIAGNOSIS — E119 Type 2 diabetes mellitus without complications: Secondary | ICD-10-CM | POA: Diagnosis not present

## 2019-11-30 DIAGNOSIS — N186 End stage renal disease: Secondary | ICD-10-CM | POA: Diagnosis not present

## 2019-11-30 DIAGNOSIS — Z992 Dependence on renal dialysis: Secondary | ICD-10-CM | POA: Diagnosis not present

## 2019-12-02 DIAGNOSIS — D631 Anemia in chronic kidney disease: Secondary | ICD-10-CM | POA: Diagnosis not present

## 2019-12-02 DIAGNOSIS — E119 Type 2 diabetes mellitus without complications: Secondary | ICD-10-CM | POA: Diagnosis not present

## 2019-12-02 DIAGNOSIS — D509 Iron deficiency anemia, unspecified: Secondary | ICD-10-CM | POA: Diagnosis not present

## 2019-12-02 DIAGNOSIS — Z992 Dependence on renal dialysis: Secondary | ICD-10-CM | POA: Diagnosis not present

## 2019-12-02 DIAGNOSIS — N186 End stage renal disease: Secondary | ICD-10-CM | POA: Diagnosis not present

## 2019-12-02 DIAGNOSIS — N2581 Secondary hyperparathyroidism of renal origin: Secondary | ICD-10-CM | POA: Diagnosis not present

## 2019-12-04 ENCOUNTER — Emergency Department (HOSPITAL_COMMUNITY)
Admission: EM | Admit: 2019-12-04 | Discharge: 2019-12-05 | Disposition: A | Discharge status: Home / Self Care | Payer: Medicare Other | Attending: Emergency Medicine | Admitting: Emergency Medicine

## 2019-12-04 ENCOUNTER — Encounter (HOSPITAL_COMMUNITY): Payer: Self-pay

## 2019-12-04 DIAGNOSIS — I2510 Atherosclerotic heart disease of native coronary artery without angina pectoris: Secondary | ICD-10-CM | POA: Insufficient documentation

## 2019-12-04 DIAGNOSIS — Z87891 Personal history of nicotine dependence: Secondary | ICD-10-CM | POA: Insufficient documentation

## 2019-12-04 DIAGNOSIS — Z8673 Personal history of transient ischemic attack (TIA), and cerebral infarction without residual deficits: Secondary | ICD-10-CM | POA: Insufficient documentation

## 2019-12-04 DIAGNOSIS — E1122 Type 2 diabetes mellitus with diabetic chronic kidney disease: Secondary | ICD-10-CM | POA: Diagnosis not present

## 2019-12-04 DIAGNOSIS — E119 Type 2 diabetes mellitus without complications: Secondary | ICD-10-CM | POA: Diagnosis not present

## 2019-12-04 DIAGNOSIS — N186 End stage renal disease: Secondary | ICD-10-CM | POA: Insufficient documentation

## 2019-12-04 DIAGNOSIS — I5041 Acute combined systolic (congestive) and diastolic (congestive) heart failure: Secondary | ICD-10-CM | POA: Insufficient documentation

## 2019-12-04 DIAGNOSIS — J449 Chronic obstructive pulmonary disease, unspecified: Secondary | ICD-10-CM | POA: Insufficient documentation

## 2019-12-04 DIAGNOSIS — Z992 Dependence on renal dialysis: Secondary | ICD-10-CM | POA: Insufficient documentation

## 2019-12-04 DIAGNOSIS — I252 Old myocardial infarction: Secondary | ICD-10-CM | POA: Insufficient documentation

## 2019-12-04 DIAGNOSIS — R0902 Hypoxemia: Secondary | ICD-10-CM | POA: Diagnosis not present

## 2019-12-04 DIAGNOSIS — I132 Hypertensive heart and chronic kidney disease with heart failure and with stage 5 chronic kidney disease, or end stage renal disease: Secondary | ICD-10-CM | POA: Insufficient documentation

## 2019-12-04 DIAGNOSIS — D631 Anemia in chronic kidney disease: Secondary | ICD-10-CM | POA: Diagnosis not present

## 2019-12-04 DIAGNOSIS — K5900 Constipation, unspecified: Secondary | ICD-10-CM | POA: Insufficient documentation

## 2019-12-04 DIAGNOSIS — Z951 Presence of aortocoronary bypass graft: Secondary | ICD-10-CM | POA: Diagnosis not present

## 2019-12-04 DIAGNOSIS — D509 Iron deficiency anemia, unspecified: Secondary | ICD-10-CM | POA: Diagnosis not present

## 2019-12-04 DIAGNOSIS — F0150 Vascular dementia without behavioral disturbance: Secondary | ICD-10-CM | POA: Insufficient documentation

## 2019-12-04 DIAGNOSIS — R52 Pain, unspecified: Secondary | ICD-10-CM | POA: Diagnosis not present

## 2019-12-04 DIAGNOSIS — I959 Hypotension, unspecified: Secondary | ICD-10-CM | POA: Diagnosis not present

## 2019-12-04 DIAGNOSIS — N2581 Secondary hyperparathyroidism of renal origin: Secondary | ICD-10-CM | POA: Diagnosis not present

## 2019-12-04 LAB — CBC
HCT: 34.4 % — ABNORMAL LOW (ref 39.0–52.0)
Hemoglobin: 10.8 g/dL — ABNORMAL LOW (ref 13.0–17.0)
MCH: 28.8 pg (ref 26.0–34.0)
MCHC: 31.4 g/dL (ref 30.0–36.0)
MCV: 91.7 fL (ref 80.0–100.0)
Platelets: 280 10*3/uL (ref 150–400)
RBC: 3.75 MIL/uL — ABNORMAL LOW (ref 4.22–5.81)
RDW: 17.7 % — ABNORMAL HIGH (ref 11.5–15.5)
WBC: 7.7 10*3/uL (ref 4.0–10.5)
nRBC: 0 % (ref 0.0–0.2)

## 2019-12-04 LAB — COMPREHENSIVE METABOLIC PANEL
ALT: 42 U/L (ref 0–44)
AST: 94 U/L — ABNORMAL HIGH (ref 15–41)
Albumin: 2.9 g/dL — ABNORMAL LOW (ref 3.5–5.0)
Alkaline Phosphatase: 108 U/L (ref 38–126)
Anion gap: 13 (ref 5–15)
BUN: 21 mg/dL (ref 8–23)
CO2: 27 mmol/L (ref 22–32)
Calcium: 8.3 mg/dL — ABNORMAL LOW (ref 8.9–10.3)
Chloride: 89 mmol/L — ABNORMAL LOW (ref 98–111)
Creatinine, Ser: 3.21 mg/dL — ABNORMAL HIGH (ref 0.61–1.24)
GFR calc Af Amer: 21 mL/min — ABNORMAL LOW (ref >60)
GFR calc non Af Amer: 18 mL/min — ABNORMAL LOW (ref >60)
Glucose, Bld: 320 mg/dL — ABNORMAL HIGH (ref 70–99)
Potassium: 4 mmol/L (ref 3.5–5.1)
Sodium: 129 mmol/L — ABNORMAL LOW (ref 135–145)
Total Bilirubin: 0.7 mg/dL (ref 0.3–1.2)
Total Protein: 7.3 g/dL (ref 6.5–8.1)

## 2019-12-04 LAB — LIPASE, BLOOD: Lipase: 40 U/L (ref 11–51)

## 2019-12-04 MED ORDER — SODIUM CHLORIDE 0.9% FLUSH: 3.0000 mL | Freq: Once | INTRAVENOUS | Status: DC

## 2019-12-04 NOTE — ED Triage Notes (Signed)
Pt comes from home via [facility] for constipation that has been going on since Wed. Seen for the same last week, takes stool softeners

## 2019-12-05 MED ORDER — POLYETHYLENE GLYCOL 3350 17 G PO PACK: 17.0000 g | PACK | Freq: Every day | ORAL | 1 refills | Status: DC | PRN

## 2019-12-05 NOTE — ED Provider Notes (Signed)
Emergency Department Provider Note   I have reviewed the triage vital signs and the nursing notes.   HISTORY  Chief Complaint Constipation   HPI TOCHI ACRES is a 74 y.o. male who presents here with constipation.  He stated that he initially had not had a bowel movement since Monday and he had been taking stool softeners without any reprieve however he had a bowel movement in the waiting room and no longer has any complaints.  His abdomen does not hurt.  He has not been sick otherwise recently.  Has been keeping up with his dialysis.   No other associated or modifying symptoms.    Past Medical History:  Diagnosis Date  . Abnormal stress test    s/p cath November 2013 with modest disease involving the ostial left main, proximal LAD, proximal RCA - do not appear to be hemodynamically signficant; mild LV dysfunction  . Anemia    low iron  . Anxiety   . Arthritis   . Bacteremia   . Chronic systolic CHF (congestive heart failure) ([place])   . COPD (chronic obstructive pulmonary disease) ([place])   . Coronary artery disease    a. per cath report 2013, b. 09/13/2017-CABG X4 & Mitral valve repair  . Depression   . DVT (deep venous thrombosis) (H[facility])    arm - right- finished blood thinner- not sure when it  . Ejection fraction < 50%   . Erectile dysfunction    penile implant  . ESRD (end stage renal disease) ([place])    [facility] on a MWF schedule.     . Gout    Hx: of  . Hepatitis C   . History of seizures    last one 2018  . Hyperlipidemia   . Hypertension   . Mitral valve disease    s/p Mitral repair 09/13/17  . Obesity   . Retinopathy   . Shortness of breath    Hx: of with exertion  . Stroke [facility])    "several" left side weakness, speech affected  . Type II or unspecified type diabetes mellitus without mention of complication, not stated as uncontrolled    adult onset    Patient Active Problem List   Diagnosis Date Noted  . Pleural effusion on left 09/30/2019  .  Acute respiratory failure with hypoxia ([place]) 09/30/2019  . Pleural effusion 09/30/2019  . Chest pain 12/02/2018  . Vascular dementia ([place]) 10/10/2017  . Paroxysmal atrial flutter ([place]) 10/10/2017  . S/P CABG x 4 10/04/2017  . Cerebral embolism with cerebral infarction 09/20/2017  . NSVT (nonsustained ventricular tachycardia) ([place])   . Dyspnea 09/08/2017  . Non-ST elevation MI (NSTEMI) ([place])   . Acute on chronic combined systolic and diastolic heart failure ([place])   . End-stage renal disease on hemodialysis ([place])   . Left leg weakness   . TIA (transient ischemic attack) 10/27/2016  . History of CVA with residual deficit 08/07/2015  . Seizure, late effect of stroke ([place]) 08/07/2015  . Diabetic peripheral neuropathy ([place]) 08/07/2015  . Obstructive sleep apnea 06/16/2013  . Hyperlipidemia associated with type 2 diabetes mellitus ([place])   . ESRD (end stage renal disease) ([place])   . Erectile dysfunction associated with type 2 diabetes mellitus ([place])   . Hypertension due to end stage renal disease caused by type 2 diabetes mellitus, on dialysis ([place])   . COPD (chronic obstructive pulmonary disease) ([place])   . Coronary artery disease   . Chronic combined systolic and diastolic CHF (  congestive heart failure) ([place])   . Gout, unspecified 02/28/2009  . Uncontrolled type 2 diabetes mellitus with end-stage renal disease ([place]) 06/08/1984    Past Surgical History:  Procedure Laterality Date  . A/V FISTULAGRAM N/A 10/04/2017   Procedure: A/V FISTULAGRAM;  Surgeon: Elam Dutch, MD;  Location: [place] [facility];  Service: Cardiovascular;  Laterality: N/A;  . ANGIOPLASTY Left 09/24/2019   Procedure: Angioplasty Innominate Vien;  Surgeon: Serafina Mitchell, MD;  Location: [place];  Service: Vascular;  Laterality: Left;  . AV FISTULA PLACEMENT Left 01/13/2013   Procedure: INSERTION OF ARTERIOVENOUS (AV) GORE-TEX GRAFT ARM;  Surgeon: Angelia Mould, MD;  Location: [place];  Service: Vascular;   Laterality: Left;  . AV FISTULA PLACEMENT Left 05/05/2013   Procedure: INSERTION OF LEFT UPPER ARM  ARTERIOVENOUS GORTEX GRAFT;  Surgeon: Angelia Mould, MD;  Location: [facility] OR;  Service: Vascular;  Laterality: Left;  . AV FISTULA PLACEMENT Left 11/26/2019   Procedure: INSERTION OF ARTERIOVENOUS (AV) GORE-TEX GRAFT, left  ARM;  Surgeon: Serafina Mitchell, MD;  Location: [place];  Service: Vascular;  Laterality: Left;  . [place] REMOVAL Left 03/26/2013   Procedure: REMOVAL OF  NON INCORPORATED ARTERIOVENOUS GORETEX GRAFT ([place]) left arm * repair of  left brachial artery with vein patch angioplasty.;  Surgeon: Angelia Mould, MD;  Location: [facility];  Service: Vascular;  Laterality: Left;  . CARDIAC CATHETERIZATION  August 26, 2012  . CATARACT EXTRACTION W/ INTRAOCULAR LENS  IMPLANT, BILATERAL    . COLONOSCOPY    . CORONARY ARTERY BYPASS GRAFT N/A 09/13/2017   Procedure: CORONARY ARTERY BYPASS GRAFTING (CABG) times four LIMA  to LAD, SVG sequentially to OM and RAMUS Intermediate and SVG to Acute Marginal;  Surgeon: Melrose Nakayama, MD;  Location: [place];  Service: Open Heart Surgery;  Laterality: N/A;  . EMBOLECTOMY Right 08/27/2013   Procedure: EMBOLECTOMY;  Surgeon: Mal Misty, MD;  Location: [place];  Service: Vascular;  Laterality: Right;  Thrombectomy of Radial and ulnar artery.  . ENDOVASCULAR STENT INSERTION Left 09/24/2019   Procedure: Endovascular Stent Graft Insertion, Arm Graft;  Surgeon: Serafina Mitchell, MD;  Location: [place];  Service: Vascular;  Laterality: Left;  . EYE SURGERY     laser.  and surgery for DM  . fistula     RUE and wrist  . FISTULOGRAM Left 09/24/2019   Procedure: SHUNTOGRAM OF ARTERIOVENOUS FISTULA LEFT ARM,;  Surgeon: Serafina Mitchell, MD;  Location: [place];  Service: Vascular;  Laterality: Left;  . INSERTION OF DIALYSIS CATHETER Right 01/01/2013   Procedure: INSERTION OF DIALYSIS CATHETER right  internal jugular;  Surgeon: Serafina Mitchell, MD;  Location:  [place];  Service: Vascular;  Laterality: Right;  . INSERTION OF DIALYSIS CATHETER N/A 03/26/2013   Procedure: INSERTION OF DIALYSIS CATHETER Left internal jugular vein;  Surgeon: Angelia Mould, MD;  Location: [place];  Service: Vascular;  Laterality: N/A;  . INSERTION OF DIALYSIS CATHETER Right 11/26/2019   Procedure: INSERTION OF DIALYSIS CATHETER, right internal jugular;  Surgeon: Serafina Mitchell, MD;  Location: [place];  Service: Vascular;  Laterality: Right;  . IR THORACENTESIS ASP PLEURAL SPACE W/IMG GUIDE  09/30/2019  . LEFT HEART CATH AND CORONARY ANGIOGRAPHY N/A 09/09/2017   Procedure: LEFT HEART CATH AND CORONARY ANGIOGRAPHY;  Surgeon: Troy Sine, MD;  Location: [place] [facility];  Service: Cardiovascular;  Laterality: N/A;  . LIGATION OF ARTERIOVENOUS  FISTULA Right 12/30/2013   Procedure:  REMOVAL OF SEGMENT OF GORTEX GRAFT AND FISTULA  AND REPAIR OF BRACHIAL ARTERY;  Surgeon: Mal Misty, MD;  Location: [place];  Service: Vascular;  Laterality: Right;  . MITRAL VALVE REPAIR N/A 09/13/2017   Procedure: MITRAL VALVE REPAIR (MVR);  Surgeon: Melrose Nakayama, MD;  Location: [place];  Service: Open Heart Surgery;  Laterality: N/A;  . MITRAL VALVE REPAIR (MV)/CORONARY ARTERY BYPASS GRAFTING (CABG)  09/13/2017  . PENILE PROSTHESIS IMPLANT     1997.. no card  . PERIPHERAL VASCULAR BALLOON ANGIOPLASTY Left 10/04/2017   Procedure: PERIPHERAL VASCULAR BALLOON ANGIOPLASTY;  Surgeon: Elam Dutch, MD;  Location: [place] CV LAB;  Service: Cardiovascular;  Laterality: Left;  AVF  . REVISION OF ARTERIOVENOUS GORETEX GRAFT Left 09/24/2019   Procedure: REVISION OF ARTERIOVENOUS GORETEX GRAFT LEFT ARM;  Surgeon: Serafina Mitchell, MD;  Location: [place];  Service: Vascular;  Laterality: Left;  . TEE WITHOUT CARDIOVERSION N/A 06/11/2013   Procedure: TRANSESOPHAGEAL ECHOCARDIOGRAM (TEE);  Surgeon: Larey Dresser, MD;  Location: [facility];  Service: Cardiovascular;  Laterality: N/A;  .  TEE WITHOUT CARDIOVERSION N/A 09/13/2017   Procedure: TRANSESOPHAGEAL ECHOCARDIOGRAM (TEE);  Surgeon: Melrose Nakayama, MD;  Location: [place];  Service: Open Heart Surgery;  Laterality: N/A;  . THROMBECTOMY W/ EMBOLECTOMY Left 03/26/2013   Procedure: Attempted thrombectomy of left arm arteriovenous goretex graft.;  Surgeon: Angelia Mould, MD;  Location: [place];  Service: Vascular;  Laterality: Left;  . ULTRASOUND GUIDANCE FOR VASCULAR ACCESS Bilateral 09/24/2019   Procedure: Ultrasound Guidance For Vascular Access, Right Femoral Vein and Left Arm Graft;  Surgeon: Serafina Mitchell, MD;  Location[facility];  Service: Vascular;  Laterality: Bilateral;  . VENOGRAM N/A 04/07/2013   Procedure: VENOGRAM;  Surgeon: Serafina Mitchell, MD;  Location: [facility] CATH LAB;  Service: Cardiovascular;  Laterality: N/A;    Current Outpatient Rx  . Order #: [id number] Class: Historical Med  . Order #: [id number] Class: Normal  . Order #: [id number] Class: Historical Med  . Order #: [id number] Class: Historical Med  . Order #: [id number] Class: Historical Med  . Order #: [id number] Class: Historical Med  . Order #: [id number] Class: Historical Med  . Order #: [id number] Class: Historical Med  . Order #: [id number] Class: Historical Med  . Order #: [id number] Class: Historical Med  . Order #: [id number] Class: Historical Med  . Order #: [id number] Class: Historical Med  . Order #: [id number] Class: No Print  . Order #: [id number] Class: Normal  . Order #: [id number] Class: Historical Med  . Order #: [id number] Class: Normal  . Order #: [id number] Class: No Print  . Order #: [id number] Class: Normal  . Order #: [id number] Class: Historical Med  . Order #: [id number] Class: Print  . Order #: [id number] Class: Historical Med  . Order #: [id number] Class: Historical Med    Allergies Patient has no known allergies.  Family History  Problem Relation Age of Onset  . Heart disease Father   . Diabetes Sister   . Diabetes Brother   . Diabetes Son      Social History Social History   Tobacco Use  . Smoking status: Former Smoker    Years: 7.00    Quit date: 06/10/1973    Years since quitting: 46.5  . Smokeless tobacco: Never Used  . Tobacco comment: Quit in 1974.  Substance Use Topics  . Alcohol use: No    Alcohol/week: 0.0 standard drinks  . Drug use: No    Review of Systems  All other systems negative except as documented in the HPI. All pertinent  positives and negatives as reviewed in the HPI. ____________________________________________   PHYSICAL EXAM:  VITAL SIGNS: ED Triage Vitals  Enc Vitals Group     BP 12/04/19 2022 (!) 112/53     Pulse Rate 12/04/19 2022 (!) 54     Resp 12/04/19 2022 16     Temp 12/04/19 2022 98.7 F (37.1 C)     Temp Source 12/04/19 2022 Oral     SpO2 12/04/19 2022 96 %    Constitutional: Alert and oriented. Well appearing and in no acute distress. Eyes: Conjunctivae are normal. PERRL. EOMI. Head: Atraumatic. Nose: No congestion/rhinnorhea. Mouth/Throat: Mucous membranes are moist.  Oropharynx non-erythematous. Neck: No stridor.  No meningeal signs.   Cardiovascular: Normal rate, regular rhythm. Good peripheral circulation. Grossly normal heart sounds.   Respiratory: Normal respiratory effort.  No retractions. Lungs CTAB. Gastrointestinal: Soft and nontender. No distention.  Musculoskeletal: No lower extremity tenderness nor edema. No gross deformities of extremities. Neurologic:  Normal speech and language. No gross focal neurologic deficits are appreciated.  Skin:  Skin is warm, dry and intact. No rash noted.  ____________________________________________   LABS (all labs ordered are listed, but only abnormal results are displayed)  Labs Reviewed  COMPREHENSIVE METABOLIC PANEL - Abnormal; Notable for the following components:      Result Value   Sodium 129 (*)    Chloride 89 (*)    Glucose, Bld 320 (*)    Creatinine, Ser 3.21 (*)    Calcium 8.3 (*)    Albumin 2.9 (*)     AST 94 (*)    GFR calc non Af Amer 18 (*)    GFR calc Af Amer 21 (*)    All other components within normal limits  CBC - Abnormal; Notable for the following components:   RBC 3.75 (*)    Hemoglobin 10.8 (*)    HCT 34.4 (*)    RDW 17.7 (*)    All other components within normal limits  LIPASE, BLOOD   ____________________________________________   INITIAL IMPRESSION / ASSESSMENT AND PLAN / ED COURSE  No longer constipated.  No other complaints.  Pertinent labs & imaging results that were available during my care of the patient were reviewed by me and considered in my medical decision making (see chart for details).  A medical screening exam was performed and I feel the patient has had an appropriate workup for their chief complaint at this time and likelihood of emergent condition existing is low. They have been counseled on decision, discharge, follow up and which symptoms necessitate immediate return to the emergency department. They or their family verbally stated understanding and agreement with plan and discharged in stable condition.   ____________________________________________  FINAL CLINICAL IMPRESSION(S) / ED DIAGNOSES  Final diagnoses:  Constipation, unspecified constipation type    MEDICATIONS GIVEN DURING THIS VISIT:  Medications - No data to display   NEW OUTPATIENT MEDICATIONS STARTED DURING THIS VISIT:  Discharge Medication List as of 12/05/2019 12:41 AM    START taking these medications   Details  polyethylene glycol (MIRALAX / GLYCOLAX) 17 g packet Take 17 g by mouth daily as needed for moderate constipation., Starting Sat 12/05/2019, Print        Note:  This note was prepared with assistance of Dragon voice recognition software. Occasional wrong-word or sound-a-like substitutions may have occurred due to the inherent limitations of voice recognition software.   [place] Doss, Corene Cornea, MD 12/05/19 [phone]

## 2019-12-05 NOTE — ED Notes (Signed)
Patient verbalizes understanding of discharge instructions. Opportunity for questioning and answers were provided. Armband removed by staff, pt discharged from ED.  

## 2019-12-07 DIAGNOSIS — E119 Type 2 diabetes mellitus without complications: Secondary | ICD-10-CM | POA: Diagnosis not present

## 2019-12-07 DIAGNOSIS — D509 Iron deficiency anemia, unspecified: Secondary | ICD-10-CM | POA: Diagnosis not present

## 2019-12-07 DIAGNOSIS — N2581 Secondary hyperparathyroidism of renal origin: Secondary | ICD-10-CM | POA: Diagnosis not present

## 2019-12-07 DIAGNOSIS — N186 End stage renal disease: Secondary | ICD-10-CM | POA: Diagnosis not present

## 2019-12-07 DIAGNOSIS — Z992 Dependence on renal dialysis: Secondary | ICD-10-CM | POA: Diagnosis not present

## 2019-12-07 DIAGNOSIS — D631 Anemia in chronic kidney disease: Secondary | ICD-10-CM | POA: Diagnosis not present

## 2019-12-09 DIAGNOSIS — Z992 Dependence on renal dialysis: Secondary | ICD-10-CM | POA: Diagnosis not present

## 2019-12-09 DIAGNOSIS — D509 Iron deficiency anemia, unspecified: Secondary | ICD-10-CM | POA: Diagnosis not present

## 2019-12-09 DIAGNOSIS — D631 Anemia in chronic kidney disease: Secondary | ICD-10-CM | POA: Diagnosis not present

## 2019-12-09 DIAGNOSIS — N2581 Secondary hyperparathyroidism of renal origin: Secondary | ICD-10-CM | POA: Diagnosis not present

## 2019-12-09 DIAGNOSIS — N186 End stage renal disease: Secondary | ICD-10-CM | POA: Diagnosis not present

## 2019-12-09 DIAGNOSIS — E119 Type 2 diabetes mellitus without complications: Secondary | ICD-10-CM | POA: Diagnosis not present

## 2019-12-10 ENCOUNTER — Other Ambulatory Visit: Payer: Self-pay

## 2019-12-10 ENCOUNTER — Ambulatory Visit (INDEPENDENT_AMBULATORY_CARE_PROVIDER_SITE_OTHER): Payer: Self-pay | Admitting: Physician Assistant

## 2019-12-10 VITALS — BP 96/55 | HR 78 | Temp 97.5°F | Resp 16 | Ht 69.0 in | Wt 201.0 lb

## 2019-12-10 DIAGNOSIS — N186 End stage renal disease: Secondary | ICD-10-CM

## 2019-12-10 NOTE — Progress Notes (Signed)
    Postoperative Access Visit   History of Present Illness   Ernest [name] is a 74 y.o. year old male who presents for postoperative follow-up for: left upper arm arteriovenous graft by Dr. Trula Slade (Date: 11/26/19).  The patient's wounds are healed.  The patient denies steal symptoms.  The patient is able to complete their activities of daily living.  He is dialyzing via R IJ TDC on a MWF schedule.  Physical Examination   Vitals:   12/10/19 0911  BP: (!) 96/55  Pulse: 78  Resp: 16  Temp: (!) 97.5 F (36.4 C)  TempSrc: Temporal  Weight: 201 lb (91.2 kg)  Height: 5\' 9"  (1.753 m)   Body mass index is 29.68 kg/m.  left arm Incisions are healed, palpable radial pulse, hand grip is 5/5, sensation in digits is intact, palpable thrill, bruit can be auscultated     Medical Decision Making   Ernest [name] is a 74 y.o. year old male who presents s/p left upper arm arteriovenous graft   Sutures removed and steri strips applied to area of excised ulcerated skin  Patent L arm AVG tunneled outside of old graft without signs or symptoms of steal syndrome  The patient's access will be ready for use 12/24/19  The patient's tunneled dialysis catheter can be removed when Nephrology is comfortable with the performance of the graft  The patient may follow up on a prn basis   Dagoberto Ligas [name] [facility] Office: [phone]  Clinic MD: Oneida Alar

## 2019-12-11 DIAGNOSIS — D631 Anemia in chronic kidney disease: Secondary | ICD-10-CM | POA: Diagnosis not present

## 2019-12-11 DIAGNOSIS — N186 End stage renal disease: Secondary | ICD-10-CM | POA: Diagnosis not present

## 2019-12-11 DIAGNOSIS — E119 Type 2 diabetes mellitus without complications: Secondary | ICD-10-CM | POA: Diagnosis not present

## 2019-12-11 DIAGNOSIS — N2581 Secondary hyperparathyroidism of renal origin: Secondary | ICD-10-CM | POA: Diagnosis not present

## 2019-12-11 DIAGNOSIS — Z992 Dependence on renal dialysis: Secondary | ICD-10-CM | POA: Diagnosis not present

## 2019-12-11 DIAGNOSIS — D509 Iron deficiency anemia, unspecified: Secondary | ICD-10-CM | POA: Diagnosis not present

## 2019-12-14 DIAGNOSIS — N2581 Secondary hyperparathyroidism of renal origin: Secondary | ICD-10-CM | POA: Diagnosis not present

## 2019-12-14 DIAGNOSIS — Z23 Encounter for immunization: Secondary | ICD-10-CM | POA: Diagnosis not present

## 2019-12-14 DIAGNOSIS — D631 Anemia in chronic kidney disease: Secondary | ICD-10-CM | POA: Diagnosis not present

## 2019-12-14 DIAGNOSIS — D509 Iron deficiency anemia, unspecified: Secondary | ICD-10-CM | POA: Diagnosis not present

## 2019-12-14 DIAGNOSIS — N186 End stage renal disease: Secondary | ICD-10-CM | POA: Diagnosis not present

## 2019-12-14 DIAGNOSIS — Z992 Dependence on renal dialysis: Secondary | ICD-10-CM | POA: Diagnosis not present

## 2019-12-14 DIAGNOSIS — E1129 Type 2 diabetes mellitus with other diabetic kidney complication: Secondary | ICD-10-CM | POA: Diagnosis not present

## 2019-12-16 DIAGNOSIS — N2581 Secondary hyperparathyroidism of renal origin: Secondary | ICD-10-CM | POA: Diagnosis not present

## 2019-12-16 DIAGNOSIS — N186 End stage renal disease: Secondary | ICD-10-CM | POA: Diagnosis not present

## 2019-12-16 DIAGNOSIS — D631 Anemia in chronic kidney disease: Secondary | ICD-10-CM | POA: Diagnosis not present

## 2019-12-16 DIAGNOSIS — D509 Iron deficiency anemia, unspecified: Secondary | ICD-10-CM | POA: Diagnosis not present

## 2019-12-16 DIAGNOSIS — Z992 Dependence on renal dialysis: Secondary | ICD-10-CM | POA: Diagnosis not present

## 2019-12-16 DIAGNOSIS — Z23 Encounter for immunization: Secondary | ICD-10-CM | POA: Diagnosis not present

## 2019-12-18 DIAGNOSIS — N2581 Secondary hyperparathyroidism of renal origin: Secondary | ICD-10-CM | POA: Diagnosis not present

## 2019-12-18 DIAGNOSIS — Z23 Encounter for immunization: Secondary | ICD-10-CM | POA: Diagnosis not present

## 2019-12-18 DIAGNOSIS — Z992 Dependence on renal dialysis: Secondary | ICD-10-CM | POA: Diagnosis not present

## 2019-12-18 DIAGNOSIS — D509 Iron deficiency anemia, unspecified: Secondary | ICD-10-CM | POA: Diagnosis not present

## 2019-12-18 DIAGNOSIS — N186 End stage renal disease: Secondary | ICD-10-CM | POA: Diagnosis not present

## 2019-12-18 DIAGNOSIS — D631 Anemia in chronic kidney disease: Secondary | ICD-10-CM | POA: Diagnosis not present

## 2019-12-21 DIAGNOSIS — D631 Anemia in chronic kidney disease: Secondary | ICD-10-CM | POA: Diagnosis not present

## 2019-12-21 DIAGNOSIS — D509 Iron deficiency anemia, unspecified: Secondary | ICD-10-CM | POA: Diagnosis not present

## 2019-12-21 DIAGNOSIS — Z992 Dependence on renal dialysis: Secondary | ICD-10-CM | POA: Diagnosis not present

## 2019-12-21 DIAGNOSIS — N186 End stage renal disease: Secondary | ICD-10-CM | POA: Diagnosis not present

## 2019-12-21 DIAGNOSIS — N2581 Secondary hyperparathyroidism of renal origin: Secondary | ICD-10-CM | POA: Diagnosis not present

## 2019-12-21 DIAGNOSIS — Z23 Encounter for immunization: Secondary | ICD-10-CM | POA: Diagnosis not present

## 2019-12-23 DIAGNOSIS — N2581 Secondary hyperparathyroidism of renal origin: Secondary | ICD-10-CM | POA: Diagnosis not present

## 2019-12-23 DIAGNOSIS — Z23 Encounter for immunization: Secondary | ICD-10-CM | POA: Diagnosis not present

## 2019-12-23 DIAGNOSIS — D509 Iron deficiency anemia, unspecified: Secondary | ICD-10-CM | POA: Diagnosis not present

## 2019-12-23 DIAGNOSIS — Z992 Dependence on renal dialysis: Secondary | ICD-10-CM | POA: Diagnosis not present

## 2019-12-23 DIAGNOSIS — D631 Anemia in chronic kidney disease: Secondary | ICD-10-CM | POA: Diagnosis not present

## 2019-12-23 DIAGNOSIS — N186 End stage renal disease: Secondary | ICD-10-CM | POA: Diagnosis not present

## 2019-12-25 DIAGNOSIS — Z992 Dependence on renal dialysis: Secondary | ICD-10-CM | POA: Diagnosis not present

## 2019-12-25 DIAGNOSIS — N186 End stage renal disease: Secondary | ICD-10-CM | POA: Diagnosis not present

## 2019-12-25 DIAGNOSIS — D631 Anemia in chronic kidney disease: Secondary | ICD-10-CM | POA: Diagnosis not present

## 2019-12-25 DIAGNOSIS — D509 Iron deficiency anemia, unspecified: Secondary | ICD-10-CM | POA: Diagnosis not present

## 2019-12-25 DIAGNOSIS — N2581 Secondary hyperparathyroidism of renal origin: Secondary | ICD-10-CM | POA: Diagnosis not present

## 2019-12-25 DIAGNOSIS — Z23 Encounter for immunization: Secondary | ICD-10-CM | POA: Diagnosis not present

## 2019-12-28 DIAGNOSIS — N186 End stage renal disease: Secondary | ICD-10-CM | POA: Diagnosis not present

## 2019-12-28 DIAGNOSIS — D631 Anemia in chronic kidney disease: Secondary | ICD-10-CM | POA: Diagnosis not present

## 2019-12-28 DIAGNOSIS — Z992 Dependence on renal dialysis: Secondary | ICD-10-CM | POA: Diagnosis not present

## 2019-12-28 DIAGNOSIS — Z23 Encounter for immunization: Secondary | ICD-10-CM | POA: Diagnosis not present

## 2019-12-28 DIAGNOSIS — D509 Iron deficiency anemia, unspecified: Secondary | ICD-10-CM | POA: Diagnosis not present

## 2019-12-28 DIAGNOSIS — N2581 Secondary hyperparathyroidism of renal origin: Secondary | ICD-10-CM | POA: Diagnosis not present

## 2019-12-30 DIAGNOSIS — N186 End stage renal disease: Secondary | ICD-10-CM | POA: Diagnosis not present

## 2019-12-30 DIAGNOSIS — D631 Anemia in chronic kidney disease: Secondary | ICD-10-CM | POA: Diagnosis not present

## 2019-12-30 DIAGNOSIS — N2581 Secondary hyperparathyroidism of renal origin: Secondary | ICD-10-CM | POA: Diagnosis not present

## 2019-12-30 DIAGNOSIS — D509 Iron deficiency anemia, unspecified: Secondary | ICD-10-CM | POA: Diagnosis not present

## 2019-12-30 DIAGNOSIS — Z23 Encounter for immunization: Secondary | ICD-10-CM | POA: Diagnosis not present

## 2019-12-30 DIAGNOSIS — Z992 Dependence on renal dialysis: Secondary | ICD-10-CM | POA: Diagnosis not present

## 2020-01-01 DIAGNOSIS — N2581 Secondary hyperparathyroidism of renal origin: Secondary | ICD-10-CM | POA: Diagnosis not present

## 2020-01-01 DIAGNOSIS — D631 Anemia in chronic kidney disease: Secondary | ICD-10-CM | POA: Diagnosis not present

## 2020-01-01 DIAGNOSIS — Z992 Dependence on renal dialysis: Secondary | ICD-10-CM | POA: Diagnosis not present

## 2020-01-01 DIAGNOSIS — D509 Iron deficiency anemia, unspecified: Secondary | ICD-10-CM | POA: Diagnosis not present

## 2020-01-01 DIAGNOSIS — Z23 Encounter for immunization: Secondary | ICD-10-CM | POA: Diagnosis not present

## 2020-01-01 DIAGNOSIS — N186 End stage renal disease: Secondary | ICD-10-CM | POA: Diagnosis not present

## 2020-01-04 DIAGNOSIS — D509 Iron deficiency anemia, unspecified: Secondary | ICD-10-CM | POA: Diagnosis not present

## 2020-01-04 DIAGNOSIS — N186 End stage renal disease: Secondary | ICD-10-CM | POA: Diagnosis not present

## 2020-01-04 DIAGNOSIS — Z23 Encounter for immunization: Secondary | ICD-10-CM | POA: Diagnosis not present

## 2020-01-04 DIAGNOSIS — Z992 Dependence on renal dialysis: Secondary | ICD-10-CM | POA: Diagnosis not present

## 2020-01-04 DIAGNOSIS — D631 Anemia in chronic kidney disease: Secondary | ICD-10-CM | POA: Diagnosis not present

## 2020-01-04 DIAGNOSIS — N2581 Secondary hyperparathyroidism of renal origin: Secondary | ICD-10-CM | POA: Diagnosis not present

## 2020-01-06 DIAGNOSIS — Z23 Encounter for immunization: Secondary | ICD-10-CM | POA: Diagnosis not present

## 2020-01-06 DIAGNOSIS — D631 Anemia in chronic kidney disease: Secondary | ICD-10-CM | POA: Diagnosis not present

## 2020-01-06 DIAGNOSIS — Z992 Dependence on renal dialysis: Secondary | ICD-10-CM | POA: Diagnosis not present

## 2020-01-06 DIAGNOSIS — N2581 Secondary hyperparathyroidism of renal origin: Secondary | ICD-10-CM | POA: Diagnosis not present

## 2020-01-06 DIAGNOSIS — D509 Iron deficiency anemia, unspecified: Secondary | ICD-10-CM | POA: Diagnosis not present

## 2020-01-06 DIAGNOSIS — N186 End stage renal disease: Secondary | ICD-10-CM | POA: Diagnosis not present

## 2020-01-08 DIAGNOSIS — D631 Anemia in chronic kidney disease: Secondary | ICD-10-CM | POA: Diagnosis not present

## 2020-01-08 DIAGNOSIS — D509 Iron deficiency anemia, unspecified: Secondary | ICD-10-CM | POA: Diagnosis not present

## 2020-01-08 DIAGNOSIS — N186 End stage renal disease: Secondary | ICD-10-CM | POA: Diagnosis not present

## 2020-01-08 DIAGNOSIS — Z23 Encounter for immunization: Secondary | ICD-10-CM | POA: Diagnosis not present

## 2020-01-08 DIAGNOSIS — Z992 Dependence on renal dialysis: Secondary | ICD-10-CM | POA: Diagnosis not present

## 2020-01-08 DIAGNOSIS — N2581 Secondary hyperparathyroidism of renal origin: Secondary | ICD-10-CM | POA: Diagnosis not present

## 2020-01-11 DIAGNOSIS — Z992 Dependence on renal dialysis: Secondary | ICD-10-CM | POA: Diagnosis not present

## 2020-01-11 DIAGNOSIS — N186 End stage renal disease: Secondary | ICD-10-CM | POA: Diagnosis not present

## 2020-01-11 DIAGNOSIS — D631 Anemia in chronic kidney disease: Secondary | ICD-10-CM | POA: Diagnosis not present

## 2020-01-11 DIAGNOSIS — N2581 Secondary hyperparathyroidism of renal origin: Secondary | ICD-10-CM | POA: Diagnosis not present

## 2020-01-11 DIAGNOSIS — D509 Iron deficiency anemia, unspecified: Secondary | ICD-10-CM | POA: Diagnosis not present

## 2020-01-11 DIAGNOSIS — Z23 Encounter for immunization: Secondary | ICD-10-CM | POA: Diagnosis not present

## 2020-01-14 DIAGNOSIS — Z992 Dependence on renal dialysis: Secondary | ICD-10-CM | POA: Diagnosis not present

## 2020-01-14 DIAGNOSIS — N186 End stage renal disease: Secondary | ICD-10-CM | POA: Diagnosis not present

## 2020-01-14 DIAGNOSIS — E1129 Type 2 diabetes mellitus with other diabetic kidney complication: Secondary | ICD-10-CM | POA: Diagnosis not present

## 2020-01-15 DIAGNOSIS — Z992 Dependence on renal dialysis: Secondary | ICD-10-CM | POA: Diagnosis not present

## 2020-01-15 DIAGNOSIS — N2581 Secondary hyperparathyroidism of renal origin: Secondary | ICD-10-CM | POA: Diagnosis not present

## 2020-01-15 DIAGNOSIS — D631 Anemia in chronic kidney disease: Secondary | ICD-10-CM | POA: Diagnosis not present

## 2020-01-15 DIAGNOSIS — E119 Type 2 diabetes mellitus without complications: Secondary | ICD-10-CM | POA: Diagnosis not present

## 2020-01-15 DIAGNOSIS — N186 End stage renal disease: Secondary | ICD-10-CM | POA: Diagnosis not present

## 2020-01-15 DIAGNOSIS — D509 Iron deficiency anemia, unspecified: Secondary | ICD-10-CM | POA: Diagnosis not present

## 2020-01-18 DIAGNOSIS — N186 End stage renal disease: Secondary | ICD-10-CM | POA: Diagnosis not present

## 2020-01-18 DIAGNOSIS — N2581 Secondary hyperparathyroidism of renal origin: Secondary | ICD-10-CM | POA: Diagnosis not present

## 2020-01-18 DIAGNOSIS — D631 Anemia in chronic kidney disease: Secondary | ICD-10-CM | POA: Diagnosis not present

## 2020-01-18 DIAGNOSIS — D509 Iron deficiency anemia, unspecified: Secondary | ICD-10-CM | POA: Diagnosis not present

## 2020-01-18 DIAGNOSIS — Z992 Dependence on renal dialysis: Secondary | ICD-10-CM | POA: Diagnosis not present

## 2020-01-18 DIAGNOSIS — E119 Type 2 diabetes mellitus without complications: Secondary | ICD-10-CM | POA: Diagnosis not present

## 2020-01-20 DIAGNOSIS — D631 Anemia in chronic kidney disease: Secondary | ICD-10-CM | POA: Diagnosis not present

## 2020-01-20 DIAGNOSIS — N2581 Secondary hyperparathyroidism of renal origin: Secondary | ICD-10-CM | POA: Diagnosis not present

## 2020-01-20 DIAGNOSIS — N186 End stage renal disease: Secondary | ICD-10-CM | POA: Diagnosis not present

## 2020-01-20 DIAGNOSIS — E119 Type 2 diabetes mellitus without complications: Secondary | ICD-10-CM | POA: Diagnosis not present

## 2020-01-20 DIAGNOSIS — Z992 Dependence on renal dialysis: Secondary | ICD-10-CM | POA: Diagnosis not present

## 2020-01-20 DIAGNOSIS — D509 Iron deficiency anemia, unspecified: Secondary | ICD-10-CM | POA: Diagnosis not present

## 2020-01-22 DIAGNOSIS — N2581 Secondary hyperparathyroidism of renal origin: Secondary | ICD-10-CM | POA: Diagnosis not present

## 2020-01-22 DIAGNOSIS — D631 Anemia in chronic kidney disease: Secondary | ICD-10-CM | POA: Diagnosis not present

## 2020-01-22 DIAGNOSIS — N186 End stage renal disease: Secondary | ICD-10-CM | POA: Diagnosis not present

## 2020-01-22 DIAGNOSIS — E119 Type 2 diabetes mellitus without complications: Secondary | ICD-10-CM | POA: Diagnosis not present

## 2020-01-22 DIAGNOSIS — D509 Iron deficiency anemia, unspecified: Secondary | ICD-10-CM | POA: Diagnosis not present

## 2020-01-22 DIAGNOSIS — Z992 Dependence on renal dialysis: Secondary | ICD-10-CM | POA: Diagnosis not present

## 2020-01-25 DIAGNOSIS — N2581 Secondary hyperparathyroidism of renal origin: Secondary | ICD-10-CM | POA: Diagnosis not present

## 2020-01-25 DIAGNOSIS — E119 Type 2 diabetes mellitus without complications: Secondary | ICD-10-CM | POA: Diagnosis not present

## 2020-01-25 DIAGNOSIS — Z992 Dependence on renal dialysis: Secondary | ICD-10-CM | POA: Diagnosis not present

## 2020-01-25 DIAGNOSIS — N186 End stage renal disease: Secondary | ICD-10-CM | POA: Diagnosis not present

## 2020-01-25 DIAGNOSIS — D509 Iron deficiency anemia, unspecified: Secondary | ICD-10-CM | POA: Diagnosis not present

## 2020-01-25 DIAGNOSIS — D631 Anemia in chronic kidney disease: Secondary | ICD-10-CM | POA: Diagnosis not present

## 2020-01-27 DIAGNOSIS — Z992 Dependence on renal dialysis: Secondary | ICD-10-CM | POA: Diagnosis not present

## 2020-01-27 DIAGNOSIS — D631 Anemia in chronic kidney disease: Secondary | ICD-10-CM | POA: Diagnosis not present

## 2020-01-27 DIAGNOSIS — D509 Iron deficiency anemia, unspecified: Secondary | ICD-10-CM | POA: Diagnosis not present

## 2020-01-27 DIAGNOSIS — E119 Type 2 diabetes mellitus without complications: Secondary | ICD-10-CM | POA: Diagnosis not present

## 2020-01-27 DIAGNOSIS — N186 End stage renal disease: Secondary | ICD-10-CM | POA: Diagnosis not present

## 2020-01-27 DIAGNOSIS — N2581 Secondary hyperparathyroidism of renal origin: Secondary | ICD-10-CM | POA: Diagnosis not present

## 2020-01-28 DIAGNOSIS — Z452 Encounter for adjustment and management of vascular access device: Secondary | ICD-10-CM | POA: Diagnosis not present

## 2020-01-29 DIAGNOSIS — N186 End stage renal disease: Secondary | ICD-10-CM | POA: Diagnosis not present

## 2020-01-29 DIAGNOSIS — D631 Anemia in chronic kidney disease: Secondary | ICD-10-CM | POA: Diagnosis not present

## 2020-01-29 DIAGNOSIS — E119 Type 2 diabetes mellitus without complications: Secondary | ICD-10-CM | POA: Diagnosis not present

## 2020-01-29 DIAGNOSIS — D509 Iron deficiency anemia, unspecified: Secondary | ICD-10-CM | POA: Diagnosis not present

## 2020-01-29 DIAGNOSIS — N2581 Secondary hyperparathyroidism of renal origin: Secondary | ICD-10-CM | POA: Diagnosis not present

## 2020-01-29 DIAGNOSIS — Z992 Dependence on renal dialysis: Secondary | ICD-10-CM | POA: Diagnosis not present

## 2020-02-01 DIAGNOSIS — Z992 Dependence on renal dialysis: Secondary | ICD-10-CM | POA: Diagnosis not present

## 2020-02-01 DIAGNOSIS — N2581 Secondary hyperparathyroidism of renal origin: Secondary | ICD-10-CM | POA: Diagnosis not present

## 2020-02-01 DIAGNOSIS — D631 Anemia in chronic kidney disease: Secondary | ICD-10-CM | POA: Diagnosis not present

## 2020-02-01 DIAGNOSIS — N186 End stage renal disease: Secondary | ICD-10-CM | POA: Diagnosis not present

## 2020-02-01 DIAGNOSIS — I871 Compression of vein: Secondary | ICD-10-CM | POA: Diagnosis not present

## 2020-02-01 DIAGNOSIS — M79602 Pain in left arm: Secondary | ICD-10-CM | POA: Diagnosis not present

## 2020-02-01 DIAGNOSIS — D509 Iron deficiency anemia, unspecified: Secondary | ICD-10-CM | POA: Diagnosis not present

## 2020-02-01 DIAGNOSIS — E119 Type 2 diabetes mellitus without complications: Secondary | ICD-10-CM | POA: Diagnosis not present

## 2020-02-03 DIAGNOSIS — N186 End stage renal disease: Secondary | ICD-10-CM | POA: Diagnosis not present

## 2020-02-03 DIAGNOSIS — D631 Anemia in chronic kidney disease: Secondary | ICD-10-CM | POA: Diagnosis not present

## 2020-02-03 DIAGNOSIS — N2581 Secondary hyperparathyroidism of renal origin: Secondary | ICD-10-CM | POA: Diagnosis not present

## 2020-02-03 DIAGNOSIS — E1129 Type 2 diabetes mellitus with other diabetic kidney complication: Secondary | ICD-10-CM | POA: Diagnosis not present

## 2020-02-03 DIAGNOSIS — E119 Type 2 diabetes mellitus without complications: Secondary | ICD-10-CM | POA: Diagnosis not present

## 2020-02-03 DIAGNOSIS — Z992 Dependence on renal dialysis: Secondary | ICD-10-CM | POA: Diagnosis not present

## 2020-02-03 DIAGNOSIS — D509 Iron deficiency anemia, unspecified: Secondary | ICD-10-CM | POA: Diagnosis not present

## 2020-02-05 DIAGNOSIS — E119 Type 2 diabetes mellitus without complications: Secondary | ICD-10-CM | POA: Diagnosis not present

## 2020-02-05 DIAGNOSIS — N186 End stage renal disease: Secondary | ICD-10-CM | POA: Diagnosis not present

## 2020-02-05 DIAGNOSIS — D509 Iron deficiency anemia, unspecified: Secondary | ICD-10-CM | POA: Diagnosis not present

## 2020-02-05 DIAGNOSIS — D631 Anemia in chronic kidney disease: Secondary | ICD-10-CM | POA: Diagnosis not present

## 2020-02-05 DIAGNOSIS — Z992 Dependence on renal dialysis: Secondary | ICD-10-CM | POA: Diagnosis not present

## 2020-02-05 DIAGNOSIS — N2581 Secondary hyperparathyroidism of renal origin: Secondary | ICD-10-CM | POA: Diagnosis not present

## 2020-02-08 DIAGNOSIS — N2581 Secondary hyperparathyroidism of renal origin: Secondary | ICD-10-CM | POA: Diagnosis not present

## 2020-02-08 DIAGNOSIS — E119 Type 2 diabetes mellitus without complications: Secondary | ICD-10-CM | POA: Diagnosis not present

## 2020-02-08 DIAGNOSIS — Z992 Dependence on renal dialysis: Secondary | ICD-10-CM | POA: Diagnosis not present

## 2020-02-08 DIAGNOSIS — N186 End stage renal disease: Secondary | ICD-10-CM | POA: Diagnosis not present

## 2020-02-08 DIAGNOSIS — D631 Anemia in chronic kidney disease: Secondary | ICD-10-CM | POA: Diagnosis not present

## 2020-02-08 DIAGNOSIS — D509 Iron deficiency anemia, unspecified: Secondary | ICD-10-CM | POA: Diagnosis not present

## 2020-02-10 DIAGNOSIS — D509 Iron deficiency anemia, unspecified: Secondary | ICD-10-CM | POA: Diagnosis not present

## 2020-02-10 DIAGNOSIS — D631 Anemia in chronic kidney disease: Secondary | ICD-10-CM | POA: Diagnosis not present

## 2020-02-10 DIAGNOSIS — Z992 Dependence on renal dialysis: Secondary | ICD-10-CM | POA: Diagnosis not present

## 2020-02-10 DIAGNOSIS — N186 End stage renal disease: Secondary | ICD-10-CM | POA: Diagnosis not present

## 2020-02-10 DIAGNOSIS — E119 Type 2 diabetes mellitus without complications: Secondary | ICD-10-CM | POA: Diagnosis not present

## 2020-02-10 DIAGNOSIS — N2581 Secondary hyperparathyroidism of renal origin: Secondary | ICD-10-CM | POA: Diagnosis not present

## 2020-02-12 DIAGNOSIS — N186 End stage renal disease: Secondary | ICD-10-CM | POA: Diagnosis not present

## 2020-02-12 DIAGNOSIS — Z992 Dependence on renal dialysis: Secondary | ICD-10-CM | POA: Diagnosis not present

## 2020-02-12 DIAGNOSIS — D509 Iron deficiency anemia, unspecified: Secondary | ICD-10-CM | POA: Diagnosis not present

## 2020-02-12 DIAGNOSIS — D631 Anemia in chronic kidney disease: Secondary | ICD-10-CM | POA: Diagnosis not present

## 2020-02-12 DIAGNOSIS — N2581 Secondary hyperparathyroidism of renal origin: Secondary | ICD-10-CM | POA: Diagnosis not present

## 2020-02-12 DIAGNOSIS — E119 Type 2 diabetes mellitus without complications: Secondary | ICD-10-CM | POA: Diagnosis not present

## 2020-02-13 DIAGNOSIS — E1129 Type 2 diabetes mellitus with other diabetic kidney complication: Secondary | ICD-10-CM | POA: Diagnosis not present

## 2020-02-13 DIAGNOSIS — N186 End stage renal disease: Secondary | ICD-10-CM | POA: Diagnosis not present

## 2020-02-13 DIAGNOSIS — Z992 Dependence on renal dialysis: Secondary | ICD-10-CM | POA: Diagnosis not present

## 2020-02-15 DIAGNOSIS — D631 Anemia in chronic kidney disease: Secondary | ICD-10-CM | POA: Diagnosis not present

## 2020-02-15 DIAGNOSIS — N2581 Secondary hyperparathyroidism of renal origin: Secondary | ICD-10-CM | POA: Diagnosis not present

## 2020-02-15 DIAGNOSIS — N186 End stage renal disease: Secondary | ICD-10-CM | POA: Diagnosis not present

## 2020-02-15 DIAGNOSIS — D509 Iron deficiency anemia, unspecified: Secondary | ICD-10-CM | POA: Diagnosis not present

## 2020-02-15 DIAGNOSIS — Z992 Dependence on renal dialysis: Secondary | ICD-10-CM | POA: Diagnosis not present

## 2020-02-17 DIAGNOSIS — D509 Iron deficiency anemia, unspecified: Secondary | ICD-10-CM | POA: Diagnosis not present

## 2020-02-17 DIAGNOSIS — N2581 Secondary hyperparathyroidism of renal origin: Secondary | ICD-10-CM | POA: Diagnosis not present

## 2020-02-17 DIAGNOSIS — Z992 Dependence on renal dialysis: Secondary | ICD-10-CM | POA: Diagnosis not present

## 2020-02-17 DIAGNOSIS — D631 Anemia in chronic kidney disease: Secondary | ICD-10-CM | POA: Diagnosis not present

## 2020-02-17 DIAGNOSIS — N186 End stage renal disease: Secondary | ICD-10-CM | POA: Diagnosis not present

## 2020-02-19 DIAGNOSIS — N186 End stage renal disease: Secondary | ICD-10-CM | POA: Diagnosis not present

## 2020-02-19 DIAGNOSIS — Z992 Dependence on renal dialysis: Secondary | ICD-10-CM | POA: Diagnosis not present

## 2020-02-19 DIAGNOSIS — N2581 Secondary hyperparathyroidism of renal origin: Secondary | ICD-10-CM | POA: Diagnosis not present

## 2020-02-19 DIAGNOSIS — D631 Anemia in chronic kidney disease: Secondary | ICD-10-CM | POA: Diagnosis not present

## 2020-02-19 DIAGNOSIS — D509 Iron deficiency anemia, unspecified: Secondary | ICD-10-CM | POA: Diagnosis not present

## 2020-02-22 DIAGNOSIS — N2581 Secondary hyperparathyroidism of renal origin: Secondary | ICD-10-CM | POA: Diagnosis not present

## 2020-02-22 DIAGNOSIS — Z992 Dependence on renal dialysis: Secondary | ICD-10-CM | POA: Diagnosis not present

## 2020-02-22 DIAGNOSIS — D509 Iron deficiency anemia, unspecified: Secondary | ICD-10-CM | POA: Diagnosis not present

## 2020-02-22 DIAGNOSIS — D631 Anemia in chronic kidney disease: Secondary | ICD-10-CM | POA: Diagnosis not present

## 2020-02-22 DIAGNOSIS — N186 End stage renal disease: Secondary | ICD-10-CM | POA: Diagnosis not present

## 2020-02-24 DIAGNOSIS — D509 Iron deficiency anemia, unspecified: Secondary | ICD-10-CM | POA: Diagnosis not present

## 2020-02-24 DIAGNOSIS — D631 Anemia in chronic kidney disease: Secondary | ICD-10-CM | POA: Diagnosis not present

## 2020-02-24 DIAGNOSIS — N186 End stage renal disease: Secondary | ICD-10-CM | POA: Diagnosis not present

## 2020-02-24 DIAGNOSIS — Z992 Dependence on renal dialysis: Secondary | ICD-10-CM | POA: Diagnosis not present

## 2020-02-24 DIAGNOSIS — N2581 Secondary hyperparathyroidism of renal origin: Secondary | ICD-10-CM | POA: Diagnosis not present

## 2020-02-26 DIAGNOSIS — N2581 Secondary hyperparathyroidism of renal origin: Secondary | ICD-10-CM | POA: Diagnosis not present

## 2020-02-26 DIAGNOSIS — D631 Anemia in chronic kidney disease: Secondary | ICD-10-CM | POA: Diagnosis not present

## 2020-02-26 DIAGNOSIS — N186 End stage renal disease: Secondary | ICD-10-CM | POA: Diagnosis not present

## 2020-02-26 DIAGNOSIS — D509 Iron deficiency anemia, unspecified: Secondary | ICD-10-CM | POA: Diagnosis not present

## 2020-02-26 DIAGNOSIS — Z992 Dependence on renal dialysis: Secondary | ICD-10-CM | POA: Diagnosis not present

## 2020-02-29 DIAGNOSIS — N2581 Secondary hyperparathyroidism of renal origin: Secondary | ICD-10-CM | POA: Diagnosis not present

## 2020-02-29 DIAGNOSIS — D631 Anemia in chronic kidney disease: Secondary | ICD-10-CM | POA: Diagnosis not present

## 2020-02-29 DIAGNOSIS — Z992 Dependence on renal dialysis: Secondary | ICD-10-CM | POA: Diagnosis not present

## 2020-02-29 DIAGNOSIS — D509 Iron deficiency anemia, unspecified: Secondary | ICD-10-CM | POA: Diagnosis not present

## 2020-02-29 DIAGNOSIS — N186 End stage renal disease: Secondary | ICD-10-CM | POA: Diagnosis not present

## 2020-03-02 DIAGNOSIS — Z992 Dependence on renal dialysis: Secondary | ICD-10-CM | POA: Diagnosis not present

## 2020-03-02 DIAGNOSIS — N186 End stage renal disease: Secondary | ICD-10-CM | POA: Diagnosis not present

## 2020-03-02 DIAGNOSIS — D509 Iron deficiency anemia, unspecified: Secondary | ICD-10-CM | POA: Diagnosis not present

## 2020-03-02 DIAGNOSIS — N2581 Secondary hyperparathyroidism of renal origin: Secondary | ICD-10-CM | POA: Diagnosis not present

## 2020-03-02 DIAGNOSIS — D631 Anemia in chronic kidney disease: Secondary | ICD-10-CM | POA: Diagnosis not present

## 2020-03-04 DIAGNOSIS — D631 Anemia in chronic kidney disease: Secondary | ICD-10-CM | POA: Diagnosis not present

## 2020-03-04 DIAGNOSIS — N2581 Secondary hyperparathyroidism of renal origin: Secondary | ICD-10-CM | POA: Diagnosis not present

## 2020-03-04 DIAGNOSIS — N186 End stage renal disease: Secondary | ICD-10-CM | POA: Diagnosis not present

## 2020-03-04 DIAGNOSIS — Z992 Dependence on renal dialysis: Secondary | ICD-10-CM | POA: Diagnosis not present

## 2020-03-04 DIAGNOSIS — D509 Iron deficiency anemia, unspecified: Secondary | ICD-10-CM | POA: Diagnosis not present

## 2020-03-07 DIAGNOSIS — D631 Anemia in chronic kidney disease: Secondary | ICD-10-CM | POA: Diagnosis not present

## 2020-03-07 DIAGNOSIS — Z992 Dependence on renal dialysis: Secondary | ICD-10-CM | POA: Diagnosis not present

## 2020-03-07 DIAGNOSIS — N186 End stage renal disease: Secondary | ICD-10-CM | POA: Diagnosis not present

## 2020-03-07 DIAGNOSIS — D509 Iron deficiency anemia, unspecified: Secondary | ICD-10-CM | POA: Diagnosis not present

## 2020-03-07 DIAGNOSIS — N2581 Secondary hyperparathyroidism of renal origin: Secondary | ICD-10-CM | POA: Diagnosis not present

## 2020-03-09 DIAGNOSIS — N186 End stage renal disease: Secondary | ICD-10-CM | POA: Diagnosis not present

## 2020-03-09 DIAGNOSIS — D509 Iron deficiency anemia, unspecified: Secondary | ICD-10-CM | POA: Diagnosis not present

## 2020-03-09 DIAGNOSIS — D631 Anemia in chronic kidney disease: Secondary | ICD-10-CM | POA: Diagnosis not present

## 2020-03-09 DIAGNOSIS — N2581 Secondary hyperparathyroidism of renal origin: Secondary | ICD-10-CM | POA: Diagnosis not present

## 2020-03-09 DIAGNOSIS — Z992 Dependence on renal dialysis: Secondary | ICD-10-CM | POA: Diagnosis not present

## 2020-03-11 DIAGNOSIS — D631 Anemia in chronic kidney disease: Secondary | ICD-10-CM | POA: Diagnosis not present

## 2020-03-11 DIAGNOSIS — D509 Iron deficiency anemia, unspecified: Secondary | ICD-10-CM | POA: Diagnosis not present

## 2020-03-11 DIAGNOSIS — N186 End stage renal disease: Secondary | ICD-10-CM | POA: Diagnosis not present

## 2020-03-11 DIAGNOSIS — Z992 Dependence on renal dialysis: Secondary | ICD-10-CM | POA: Diagnosis not present

## 2020-03-11 DIAGNOSIS — N2581 Secondary hyperparathyroidism of renal origin: Secondary | ICD-10-CM | POA: Diagnosis not present

## 2020-03-14 DIAGNOSIS — Z992 Dependence on renal dialysis: Secondary | ICD-10-CM | POA: Diagnosis not present

## 2020-03-14 DIAGNOSIS — D631 Anemia in chronic kidney disease: Secondary | ICD-10-CM | POA: Diagnosis not present

## 2020-03-14 DIAGNOSIS — D509 Iron deficiency anemia, unspecified: Secondary | ICD-10-CM | POA: Diagnosis not present

## 2020-03-14 DIAGNOSIS — N186 End stage renal disease: Secondary | ICD-10-CM | POA: Diagnosis not present

## 2020-03-14 DIAGNOSIS — N2581 Secondary hyperparathyroidism of renal origin: Secondary | ICD-10-CM | POA: Diagnosis not present

## 2020-04-14 DIAGNOSIS — Z992 Dependence on renal dialysis: Secondary | ICD-10-CM | POA: Diagnosis not present

## 2020-04-14 DIAGNOSIS — E1129 Type 2 diabetes mellitus with other diabetic kidney complication: Secondary | ICD-10-CM | POA: Diagnosis not present

## 2020-04-14 DIAGNOSIS — N186 End stage renal disease: Secondary | ICD-10-CM | POA: Diagnosis not present

## 2020-07-01 ENCOUNTER — Other Ambulatory Visit: Payer: Self-pay

## 2020-07-01 DIAGNOSIS — N186 End stage renal disease: Secondary | ICD-10-CM

## 2020-07-01 NOTE — Addendum Note (Signed)
Addended byDoylene Bode on: 07/01/2020 09:48 AM   Modules accepted: Orders

## 2020-07-05 ENCOUNTER — Other Ambulatory Visit: Payer: Self-pay

## 2020-07-05 ENCOUNTER — Ambulatory Visit (INDEPENDENT_AMBULATORY_CARE_PROVIDER_SITE_OTHER): Payer: Medicare (Managed Care) | Admitting: Physician Assistant

## 2020-07-05 ENCOUNTER — Encounter: Payer: Self-pay | Admitting: Physician Assistant

## 2020-07-05 ENCOUNTER — Ambulatory Visit (HOSPITAL_COMMUNITY)
Admission: RE | Admit: 2020-07-05 | Discharge: 2020-07-05 | Disposition: A | Discharge status: Home / Self Care | Payer: Medicare (Managed Care) | Source: Ambulatory Visit | Attending: Surgery | Admitting: Surgery

## 2020-07-05 VITALS — BP 85/54 | HR 76 | Temp 97.9°F | Resp 20 | Ht 69.0 in

## 2020-07-05 DIAGNOSIS — N186 End stage renal disease: Secondary | ICD-10-CM | POA: Diagnosis not present

## 2020-07-05 MED ORDER — SODIUM CHLORIDE 0.9 % IV SOLN: 250.0000 mL | INTRAVENOUS | Status: DC | PRN

## 2020-07-05 NOTE — Progress Notes (Signed)
Established Dialysis Access   History of Present Illness   Ernest [name] is a 74 y.o. (1945/11/29) male who presents for re-evaluation of permanent access.  He had a L arm AVG placed by Dr. Trula Slade 11/26/19.  He was referred back to our office due to low flow rates noted during HD.  He dialyzes on a MWF schedule.  He has been able to complete HD treatments despite low flow.  He denies signs or symptoms of steal syndrome.   The patient's PMH, PSH, SH, and FamHx were reviewed and are unchanged from prior visit.  Current Outpatient Medications  Medication Sig Dispense Refill  . acetaminophen (TYLENOL) 500 MG tablet Take 500-1,000 mg by mouth every 6 (six) hours as needed (for pain/toothache).    [name] allopurinol (ZYLOPRIM) 100 MG tablet Take 100 mg by mouth daily.    [name] amiodarone (PACERONE) 200 MG tablet amiodarone 200 mg tablet  TAKE 1 TABLET BY MOUTH EVERY DAY BY ORAL ROUTE    . aspirin EC 81 MG tablet Take 1 tablet (81 mg total) by mouth daily. 30 tablet 11  . atorvastatin (LIPITOR) 40 MG tablet Take 40 mg by mouth daily.     . B Complex-C-Folic Acid (RENA-VITE RX) 1 MG TABS Take 1 tablet by mouth daily.    . BD PEN NEEDLE NANO U/F 32G X 4 MM MISC Inject into the skin as directed.    . calcium acetate (PHOSLO) 667 MG capsule Take 2,001 mg by mouth 3 (three) times daily with meals.     . cetirizine (ZYRTEC) 10 MG tablet Take 10 mg by mouth daily.    . cinacalcet (SENSIPAR) 60 MG tablet Take 60 mg by mouth daily.    . cyproheptadine (PERIACTIN) 2 MG/5ML syrup Take 2 mg by mouth 3 (three) times daily with meals.     . Darbepoetin Alfa (ARANESP) 100 MCG/0.5ML SOSY injection Inject 100 mcg into the skin every 7 (seven) days. Administered with dialysis    . diclofenac Sodium (VOLTAREN) 1 % GEL Apply 1 application topically 4 (four) times daily as needed (pain).     [name] doxercalciferol (HECTOROL) 4 MCG/2ML injection Inject 2 mcg into the vein every Monday, Wednesday, and Friday with hemodialysis.  Administered at dialysis    . doxercalciferol (HECTOROL) 4 MCG/2ML injection Doxercalciferol (Hectorol)    . ferric gluconate 125 mg in sodium chloride 0.9 % 100 mL Inject 250 mg into the vein every Monday, Wednesday, and Friday with hemodialysis.     . heparin 1000 unit/mL SOLN injection Heparin Sodium (Porcine) 1,000 Units/mL Catheter Lock Arterial    . insulin detemir (LEVEMIR) 100 UNIT/ML injection Inject 0.2 mLs (20 Units total) into the skin 2 (two) times daily. (Patient taking differently: Inject 20 Units into the skin daily. Takes 20 units every am) 10 mL 11  . iron sucrose (VENOFER) 20 MG/ML injection Iron Sucrose (Venofer)    . levETIRAcetam (KEPPRA) 500 MG tablet Take 2 tablets (1,000 mg total) by mouth daily. Then take extra 500 mg (1 tablet) on MWF after HD (Patient taking differently: Take 500 mg by mouth See admin instructions. Take 1 tablet (500 mg) by mouth twice daily on Sundays, Tuesdays, Thursdays, & Saturdays. (Morning & At supper) Take 1 tablet (500 mg) by mouth 3 times daily on Mondays, Wednesday, & Fridays (Morning, After dialysis, & at supper)) 204 tablet 3  . linaclotide (LINZESS) 145 MCG CAPS capsule Take 145 mcg by mouth daily as needed (constipation).     [name]  Methoxy PEG-Epoetin Beta (MIRCERA IJ) Mircera    . metoprolol tartrate (LOPRESSOR) 50 MG tablet Take 1 tablet (50 mg total) by mouth 2 (two) times daily. (Patient taking differently: Take 50 mg by mouth daily. ) 180 tablet 3  . mirtazapine (REMERON) 7.5 MG tablet mirtazapine 7.5 mg tablet  Take 1 tablet every day by oral route at bedtime for 90 days.    . Nutritional Supplements (FEEDING SUPPLEMENT, NEPRO CARB STEADY,) LIQD Take 237 mLs by mouth 2 (two) times daily between meals. (Patient taking differently: Take 237 mLs by mouth daily as needed (nutritional supplement). )  0  . oxyCODONE-acetaminophen (PERCOCET) 5-325 MG tablet Take 1 tablet by mouth every 6 (six) hours as needed for severe pain. 10 tablet 0  .  Polyethyl Glycol-Propyl Glycol 0.4-0.3 % SOLN Place 1 drop into both eyes 3 (three) times daily as needed (dry/irritated eyes.).    [name] polyethylene glycol (MIRALAX / GLYCOLAX) 17 g packet Take 17 g by mouth daily as needed for moderate constipation. 14 each 1  . sennosides-docusate sodium (SENOKOT-S) 8.6-50 MG tablet Take 2 tablets by mouth 2 (two) times daily as needed (constipation.).     [name] sertraline (ZOLOFT) 25 MG tablet Take 25 mg by mouth at bedtime.     No current facility-administered medications for this visit.    REVIEW OF SYSTEMS (negative unless checked):   Cardiac:  []  Chest pain or chest pressure? []  Shortness of breath upon activity? []  Shortness of breath when lying flat? []  Irregular heart rhythm?  Vascular:  []  Pain in calf, thigh, or hip brought on by walking? []  Pain in feet at night that wakes you up from your sleep? []  Blood clot in your veins? []  Leg swelling?  Pulmonary:  []  Oxygen at home? []  Productive cough? []  Wheezing?  Neurologic:  []  Sudden weakness in arms or legs? []  Sudden numbness in arms or legs? []  Sudden onset of difficult speaking or slurred speech? []  Temporary loss of vision in one eye? []  Problems with dizziness?  Gastrointestinal:  []  Blood in stool? []  Vomited blood?  Genitourinary:  []  Burning when urinating? []  Blood in urine?  Psychiatric:  []  Major depression  Hematologic:  []  Bleeding problems? []  Problems with blood clotting?  Dermatologic:  []  Rashes or ulcers?  Constitutional:  []  Fever or chills?  Ear/Nose/Throat:  []  Change in hearing? []  Nose bleeds? []  Sore throat?  Musculoskeletal:  []  Back pain? []  Joint pain? []  Muscle pain?   Physical Examination   Vitals:   07/05/20 0927  BP: (!) 85/54  Pulse: 76  Resp: 20  Temp: 97.9 F (36.6 C)  TempSrc: Temporal  Height: 5\' 9"  (1.753 m)   Body mass index is 29.68 kg/m.  General:  WDWN in NAD; vital signs documented above Gait: Not  observed HENT: WNL, normocephalic Pulmonary: normal non-labored breathing , without Rales, rhonchi,  wheezing Cardiac: regular HR Abdomen: soft, NT, no masses Skin: without rashes Vascular Exam/Pulses: palpable L radial pulse; palpable flow through L arm AVG but pulsatile Extremities: without ischemic changes, without Gangrene , without cellulitis; without open wounds;  Musculoskeletal: no muscle wasting or atrophy  Neurologic: A&O X 3;  No focal weakness or paresthesias are detected Psychiatric:  The pt has Normal affect.   Non-invasive Vascular Imaging   left Arm Access Duplex :   Stenosis near arterial anastomosis   Medical Decision Making   Ernest [name] is a 74 y.o. male who presents with ESRD requiring hemodialysis.  Patent L arm AVG without steal symptoms  Low flow noted in HD; stenosis noted near arterial anastomosis on duplex  Plan will be for L arm shuntogram with possible intervention on a non dialysis day in the next week or so Risk, benefits, and alternatives to access surgery were discussed.   The patient agrees to proceed with the procedure.   Dagoberto Ligas [name] [facility] [place] Office: [phone]  Clinic MD: Scot Dock on call

## 2020-07-05 NOTE — H&P (View-Only) (Signed)
Established Dialysis Access   History of Present Illness   Ernest [name] is a 74 y.o. (1946/10/13) male who presents for re-evaluation of permanent access.  He had a L arm AVG placed by Dr. Trula Slade 11/26/19.  He was referred back to our office due to low flow rates noted during HD.  He dialyzes on a MWF schedule.  He has been able to complete HD treatments despite low flow.  He denies signs or symptoms of steal syndrome.   The patient's PMH, PSH, SH, and FamHx were reviewed and are unchanged from prior visit.  Current Outpatient Medications  Medication Sig Dispense Refill  . acetaminophen (TYLENOL) 500 MG tablet Take 500-1,000 mg by mouth every 6 (six) hours as needed (for pain/toothache).    [name] allopurinol (ZYLOPRIM) 100 MG tablet Take 100 mg by mouth daily.    [name] amiodarone (PACERONE) 200 MG tablet amiodarone 200 mg tablet  TAKE 1 TABLET BY MOUTH EVERY DAY BY ORAL ROUTE    . aspirin EC 81 MG tablet Take 1 tablet (81 mg total) by mouth daily. 30 tablet 11  . atorvastatin (LIPITOR) 40 MG tablet Take 40 mg by mouth daily.     . B Complex-C-Folic Acid (RENA-VITE RX) 1 MG TABS Take 1 tablet by mouth daily.    . BD PEN NEEDLE NANO U/F 32G X 4 MM MISC Inject into the skin as directed.    . calcium acetate (PHOSLO) 667 MG capsule Take 2,001 mg by mouth 3 (three) times daily with meals.     . cetirizine (ZYRTEC) 10 MG tablet Take 10 mg by mouth daily.    . cinacalcet (SENSIPAR) 60 MG tablet Take 60 mg by mouth daily.    . cyproheptadine (PERIACTIN) 2 MG/5ML syrup Take 2 mg by mouth 3 (three) times daily with meals.     . Darbepoetin Alfa (ARANESP) 100 MCG/0.5ML SOSY injection Inject 100 mcg into the skin every 7 (seven) days. Administered with dialysis    . diclofenac Sodium (VOLTAREN) 1 % GEL Apply 1 application topically 4 (four) times daily as needed (pain).     [name] doxercalciferol (HECTOROL) 4 MCG/2ML injection Inject 2 mcg into the vein every Monday, Wednesday, and Friday with hemodialysis.  Administered at dialysis    . doxercalciferol (HECTOROL) 4 MCG/2ML injection Doxercalciferol (Hectorol)    . ferric gluconate 125 mg in sodium chloride 0.9 % 100 mL Inject 250 mg into the vein every Monday, Wednesday, and Friday with hemodialysis.     . heparin 1000 unit/mL SOLN injection Heparin Sodium (Porcine) 1,000 Units/mL Catheter Lock Arterial    . insulin detemir (LEVEMIR) 100 UNIT/ML injection Inject 0.2 mLs (20 Units total) into the skin 2 (two) times daily. (Patient taking differently: Inject 20 Units into the skin daily. Takes 20 units every am) 10 mL 11  . iron sucrose (VENOFER) 20 MG/ML injection Iron Sucrose (Venofer)    . levETIRAcetam (KEPPRA) 500 MG tablet Take 2 tablets (1,000 mg total) by mouth daily. Then take extra 500 mg (1 tablet) on MWF after HD (Patient taking differently: Take 500 mg by mouth See admin instructions. Take 1 tablet (500 mg) by mouth twice daily on Sundays, Tuesdays, Thursdays, & Saturdays. (Morning & At supper) Take 1 tablet (500 mg) by mouth 3 times daily on Mondays, Wednesday, & Fridays (Morning, After dialysis, & at supper)) 204 tablet 3  . linaclotide (LINZESS) 145 MCG CAPS capsule Take 145 mcg by mouth daily as needed (constipation).     [name]  Methoxy PEG-Epoetin Beta (MIRCERA IJ) Mircera    . metoprolol tartrate (LOPRESSOR) 50 MG tablet Take 1 tablet (50 mg total) by mouth 2 (two) times daily. (Patient taking differently: Take 50 mg by mouth daily. ) 180 tablet 3  . mirtazapine (REMERON) 7.5 MG tablet mirtazapine 7.5 mg tablet  Take 1 tablet every day by oral route at bedtime for 90 days.    . Nutritional Supplements (FEEDING SUPPLEMENT, NEPRO CARB STEADY,) LIQD Take 237 mLs by mouth 2 (two) times daily between meals. (Patient taking differently: Take 237 mLs by mouth daily as needed (nutritional supplement). )  0  . oxyCODONE-acetaminophen (PERCOCET) 5-325 MG tablet Take 1 tablet by mouth every 6 (six) hours as needed for severe pain. 10 tablet 0  .  Polyethyl Glycol-Propyl Glycol 0.4-0.3 % SOLN Place 1 drop into both eyes 3 (three) times daily as needed (dry/irritated eyes.).    [name] polyethylene glycol (MIRALAX / GLYCOLAX) 17 g packet Take 17 g by mouth daily as needed for moderate constipation. 14 each 1  . sennosides-docusate sodium (SENOKOT-S) 8.6-50 MG tablet Take 2 tablets by mouth 2 (two) times daily as needed (constipation.).     [name] sertraline (ZOLOFT) 25 MG tablet Take 25 mg by mouth at bedtime.     No current facility-administered medications for this visit.    REVIEW OF SYSTEMS (negative unless checked):   Cardiac:  []  Chest pain or chest pressure? []  Shortness of breath upon activity? []  Shortness of breath when lying flat? []  Irregular heart rhythm?  Vascular:  []  Pain in calf, thigh, or hip brought on by walking? []  Pain in feet at night that wakes you up from your sleep? []  Blood clot in your veins? []  Leg swelling?  Pulmonary:  []  Oxygen at home? []  Productive cough? []  Wheezing?  Neurologic:  []  Sudden weakness in arms or legs? []  Sudden numbness in arms or legs? []  Sudden onset of difficult speaking or slurred speech? []  Temporary loss of vision in one eye? []  Problems with dizziness?  Gastrointestinal:  []  Blood in stool? []  Vomited blood?  Genitourinary:  []  Burning when urinating? []  Blood in urine?  Psychiatric:  []  Major depression  Hematologic:  []  Bleeding problems? []  Problems with blood clotting?  Dermatologic:  []  Rashes or ulcers?  Constitutional:  []  Fever or chills?  Ear/Nose/Throat:  []  Change in hearing? []  Nose bleeds? []  Sore throat?  Musculoskeletal:  []  Back pain? []  Joint pain? []  Muscle pain?   Physical Examination   Vitals:   07/05/20 0927  BP: (!) 85/54  Pulse: 76  Resp: 20  Temp: 97.9 F (36.6 C)  TempSrc: Temporal  Height: 5\' 9"  (1.753 m)   Body mass index is 29.68 kg/m.  General:  WDWN in NAD; vital signs documented above Gait: Not  observed HENT: WNL, normocephalic Pulmonary: normal non-labored breathing , without Rales, rhonchi,  wheezing Cardiac: regular HR Abdomen: soft, NT, no masses Skin: without rashes Vascular Exam/Pulses: palpable L radial pulse; palpable flow through L arm AVG but pulsatile Extremities: without ischemic changes, without Gangrene , without cellulitis; without open wounds;  Musculoskeletal: no muscle wasting or atrophy  Neurologic: A&O X 3;  No focal weakness or paresthesias are detected Psychiatric:  The pt has Normal affect.   Non-invasive Vascular Imaging   left Arm Access Duplex :   Stenosis near arterial anastomosis   Medical Decision Making   Ernest [name] is a 74 y.o. male who presents with ESRD requiring hemodialysis.  Patent L arm AVG without steal symptoms  Low flow noted in HD; stenosis noted near arterial anastomosis on duplex  Plan will be for L arm shuntogram with possible intervention on a non dialysis day in the next week or so Risk, benefits, and alternatives to access surgery were discussed.   The patient agrees to proceed with the procedure.   Dagoberto Ligas [name] [facility] Office: [phone]  Clinic MD: Scot Dock on call

## 2020-07-08 ENCOUNTER — Inpatient Hospital Stay: Admission: RE | Admit: 2020-07-08 | Payer: Medicare (Managed Care) | Source: Ambulatory Visit

## 2020-07-12 ENCOUNTER — Encounter (HOSPITAL_COMMUNITY)
Admission: RE | Disposition: A | Discharge status: Home / Self Care | Payer: Self-pay | Source: Home / Self Care | Attending: Surgery

## 2020-07-12 ENCOUNTER — Ambulatory Visit (HOSPITAL_COMMUNITY)
Admission: RE | Admit: 2020-07-12 | Discharge: 2020-07-12 | Disposition: A | Discharge status: Home / Self Care | Payer: Medicare (Managed Care) | Attending: Surgery | Admitting: Surgery

## 2020-07-12 ENCOUNTER — Encounter (HOSPITAL_COMMUNITY): Payer: Self-pay | Admitting: Surgery

## 2020-07-12 DIAGNOSIS — Z20822 Contact with and (suspected) exposure to covid-19: Secondary | ICD-10-CM | POA: Diagnosis not present

## 2020-07-12 DIAGNOSIS — Y841 Kidney dialysis as the cause of abnormal reaction of the patient, or of later complication, without mention of misadventure at the time of the procedure: Secondary | ICD-10-CM | POA: Insufficient documentation

## 2020-07-12 DIAGNOSIS — T82858A Stenosis of vascular prosthetic devices, implants and grafts, initial encounter: Secondary | ICD-10-CM | POA: Insufficient documentation

## 2020-07-12 DIAGNOSIS — Z992 Dependence on renal dialysis: Secondary | ICD-10-CM | POA: Diagnosis not present

## 2020-07-12 DIAGNOSIS — T82898A Other specified complication of vascular prosthetic devices, implants and grafts, initial encounter: Secondary | ICD-10-CM | POA: Diagnosis not present

## 2020-07-12 DIAGNOSIS — Z7982 Long term (current) use of aspirin: Secondary | ICD-10-CM | POA: Insufficient documentation

## 2020-07-12 DIAGNOSIS — N186 End stage renal disease: Secondary | ICD-10-CM

## 2020-07-12 DIAGNOSIS — Z79899 Other long term (current) drug therapy: Secondary | ICD-10-CM | POA: Diagnosis not present

## 2020-07-12 DIAGNOSIS — Z794 Long term (current) use of insulin: Secondary | ICD-10-CM | POA: Insufficient documentation

## 2020-07-12 HISTORY — PX: PERIPHERAL VASCULAR BALLOON ANGIOPLASTY: CATH118281

## 2020-07-12 HISTORY — PX: A/V SHUNTOGRAM: CATH118297

## 2020-07-12 LAB — POCT I-STAT, CHEM 8
BUN: 22 mg/dL (ref 8–23)
BUN: 28 mg/dL — ABNORMAL HIGH (ref 8–23)
Calcium, Ion: 1.05 mmol/L — ABNORMAL LOW (ref 1.15–1.40)
Calcium, Ion: 1.09 mmol/L — ABNORMAL LOW (ref 1.15–1.40)
Chloride: 91 mmol/L — ABNORMAL LOW (ref 98–111)
Chloride: 93 mmol/L — ABNORMAL LOW (ref 98–111)
Creatinine, Ser: 4.7 mg/dL — ABNORMAL HIGH (ref 0.61–1.24)
Creatinine, Ser: 4.9 mg/dL — ABNORMAL HIGH (ref 0.61–1.24)
Glucose, Bld: 87 mg/dL (ref 70–99)
Glucose, Bld: 91 mg/dL (ref 70–99)
HCT: 40 % (ref 39.0–52.0)
HCT: 40 % (ref 39.0–52.0)
Hemoglobin: 13.6 g/dL (ref 13.0–17.0)
Hemoglobin: 13.6 g/dL (ref 13.0–17.0)
Potassium: 4.4 mmol/L (ref 3.5–5.1)
Potassium: 7.9 mmol/L (ref 3.5–5.1)
Sodium: 134 mmol/L — ABNORMAL LOW (ref 135–145)
Sodium: 134 mmol/L — ABNORMAL LOW (ref 135–145)
TCO2: 34 mmol/L — ABNORMAL HIGH (ref 22–32)
TCO2: 36 mmol/L — ABNORMAL HIGH (ref 22–32)

## 2020-07-12 LAB — RESPIRATORY PANEL BY RT PCR (FLU A&B, COVID)
Influenza A by PCR: NEGATIVE
Influenza B by PCR: NEGATIVE
SARS Coronavirus 2 by RT PCR: NEGATIVE

## 2020-07-12 SURGERY — A/V SHUNTOGRAM
Anesthesia: LOCAL | Laterality: Left

## 2020-07-12 MED ORDER — SODIUM CHLORIDE 0.9% FLUSH: 3.0000 mL | INTRAVENOUS | Status: DC | PRN

## 2020-07-12 MED ORDER — SODIUM CHLORIDE 0.9% FLUSH: 3.0000 mL | Freq: Two times a day (BID) | INTRAVENOUS | Status: DC

## 2020-07-12 MED ORDER — LIDOCAINE HCL (PF) 1 % IJ SOLN
INTRAMUSCULAR | Status: DC | PRN
  Administered 2020-07-12: 5 mL

## 2020-07-12 MED ORDER — IODIXANOL 320 MG/ML IV SOLN
INTRAVENOUS | Status: DC | PRN
  Administered 2020-07-12: 40 mL

## 2020-07-12 MED ORDER — LIDOCAINE HCL (PF) 1 % IJ SOLN
INTRAMUSCULAR | Status: AC
  Filled 2020-07-12: qty 30

## 2020-07-12 MED ORDER — HEPARIN (PORCINE) IN NACL 1000-0.9 UT/500ML-% IV SOLN
INTRAVENOUS | Status: AC
  Filled 2020-07-12: qty 500

## 2020-07-12 MED ORDER — HEPARIN (PORCINE) IN NACL 1000-0.9 UT/500ML-% IV SOLN
INTRAVENOUS | Status: DC | PRN
  Administered 2020-07-12: 500 mL

## 2020-07-12 SURGICAL SUPPLY — 18 items
BAG SNAP BAND KOVER 36X36 (MISCELLANEOUS) ×2 IMPLANT
BALLN MUSTANG 10X80X75 (BALLOONS) ×2
BALLN MUSTANG 12X80X75 (BALLOONS) ×2
BALLN MUSTANG 7X80X75 (BALLOONS) ×2
BALLOON MUSTANG 10X80X75 (BALLOONS) ×1 IMPLANT
BALLOON MUSTANG 12X80X75 (BALLOONS) ×1 IMPLANT
BALLOON MUSTANG 7X80X75 (BALLOONS) ×1 IMPLANT
COVER DOME SNAP 22 D (MISCELLANEOUS) ×2 IMPLANT
KIT ENCORE 26 ADVANTAGE (KITS) ×2 IMPLANT
KIT MICROPUNCTURE NIT STIFF (SHEATH) ×2 IMPLANT
PROTECTION STATION PRESSURIZED (MISCELLANEOUS) ×2
SHEATH PINNACLE R/O II 6F 4CM (SHEATH) ×2 IMPLANT
SHEATH PROBE COVER 6X72 (BAG) ×2 IMPLANT
STATION PROTECTION PRESSURIZED (MISCELLANEOUS) ×1 IMPLANT
STOPCOCK MORSE 400PSI 3WAY (MISCELLANEOUS) ×2 IMPLANT
TRAY PV CATH (CUSTOM PROCEDURE TRAY) ×2 IMPLANT
TUBING CIL FLEX 10 FLL-RA (TUBING) ×2 IMPLANT
WIRE STARTER BENTSON 035X150 (WIRE) ×2 IMPLANT

## 2020-07-12 NOTE — Progress Notes (Signed)
Patient was given discharge instructions. He verbalized understanding. 

## 2020-07-12 NOTE — Interval H&P Note (Signed)
History and Physical Interval Note:  07/12/2020 9:49 AM  Ernest [name]  has presented today for surgery, with the diagnosis of End stage renal.  The various methods of treatment have been discussed with the patient and family. After consideration of risks, benefits and other options for treatment, the patient has consented to  Procedure(s): A/V SHUNTOGRAM (Left) as a surgical intervention.  The patient's history has been reviewed, patient examined, no change in status, stable for surgery.  I have reviewed the patient's chart and labs.  Questions were answered to the patient's satisfaction.     [name]

## 2020-07-12 NOTE — Discharge Instructions (Signed)

## 2020-07-12 NOTE — Op Note (Signed)
° ° °  Patient name: Ernest [name] MRN: [id number] DOB: [birth year] Sex: male  07/12/2020 Pre-operative Diagnosis: End-stage renal disease Post-operative diagnosis:  Same Surgeon:  [name] Procedure Performed:  1.  Ultrasound-guided access, left upper arm dialysis graft  2.  Shuntogram  3.  Balloon venoplasty, left brachial vein (peripheral)  4.  Balloon venoplasty, left innominate vein (central)   Indications: The patient is having trouble with dialysis.  Shuntogram was recommended.  Procedure:  The patient was identified in the holding area and taken to room 8.  The patient was then placed supine on the table and prepped and draped in the usual sterile fashion.  A time out was called.   ultrasound was used to evaluate the fistula.  The vein was patent and compressible.  A digital ultrasound image was acquired.  The fistula was then accessed under ultrasound guidance using a micropuncture needle.  An 018 wire was then asvanced without resistance and a micropuncture sheath was placed.  Contrast injections were then performed through the sheath.  Findings: The arteriovenous anastomosis was widely patent.  The dialysis graft is widely patent however at the venous outflow tract there is approximately 50 to 60% stenosis.  Additionally, the central venous system is patent however there does appear to be approximately 50% stenosis within the left innominate vein.   Intervention: After the above images were acquired the decision was made to proceed with intervention.  A 6 French sheath was inserted over a Bentson wire.  I for selected a 7 x 60 Mustang balloon and performed balloon venoplasty of the venous outflow tract as well as the visualized portions of the left upper arm dialysis graft.  Completion imaging showed resolution of the narrowing.  Next, I selected a 10 x 80 balloon and performed balloon venoplasty of the stenosis within the left innominate vein.  Follow-up imaging revealed improved but  suboptimal result and so I upsized to a 12 x 80 balloon.  This was taken to nominal pressure.  Follow-up imaging showed significant improvement with residual stenosis less than 10%.  Catheters and wires were removed.  Suture closure was performed of the cannulation site.  There were no complications  Impression:  #1  Successful balloon venoplasty of the venous outflow tract of her left arm graft with a 7 mm balloon and of the left innominate vein using a 12 x 80 balloon.  #2  Access is ready for use and amenable to future interventions   V. [name], M.D., [facility] [place] Office: [phone] Pager:  [phone]

## 2020-07-15 ENCOUNTER — Encounter (HOSPITAL_COMMUNITY): Payer: Self-pay | Admitting: Surgery

## 2020-10-24 NOTE — Progress Notes (Signed)
Due to the COVID-19 crisis, this telephone visit was done via telephone from my office and it was initiated and consent given by this patient and or family.  Telephone (Audio) Visit The purpose of this telephone visit is to provide medical care while limiting exposure to the novel coronavirus.  Patient was unable to connect for a video visit.    Consent was obtained for telephone visit and initiated by pt/family:  Yes Answered questions that patient had about telehealth interaction:  Yes I discussed the limitations, risks, security and privacy concerns of performing an evaluation and management service by telephone. I also discussed with the patient that there may be a patient responsible charge related to this service. The patient expressed understanding and agreed to proceed.  Pt location: Home Physician Location: office Name of referring provider:  No ref. provider found I connected with .Ernest [name] at patients initiation/request on 10/25/2020 at  1:30 PM EST by telephone and verified that I am speaking with the correct person using two identifiers.  Pt MRN:  [id number] Pt DOB:  Nov 21, 1945  Assessment/Plan:   1.  Late-symptomatic seizure secondary to CVA 2.  History of CVA 3.  Type 2 diabetes mellitus 4.  Hypertension 5.  Hyperlipidemia 6.  ESRD on dialysis 7.  CAD  1.  For seizure prophylaxis:  Keppra 100mg  daily with 500mg  following dialysis 2.  Secondary stroke prevention as managed by PCP:             ASA 81mg  daily             Statin therapy (LDL goal less than 70)             Blood pressure and glycemic control 3.  Follow up in one year  Need for in person visit now:  No  Subjective:   Ernest [name] is a 75 year old right-handed man with hyperlipidemia, ESRD on HD (M/W/F), type 2 diabetes mellitus, hypertension, COPD, gout, chronic CHF,atrial flutter,coronary artery disease, hepatitis C, penile implant (unable to get MRI) and history of stroke who follows up for  seizures.  UPDATE: Current medication:  Keppra 1000mg  daily and 500mg  following dialysis.    No recent seizures.     HISTORY: Around October 2015, he developed slurred speech.He also experienced ringing in his right ear, with sensation of water in his right ear.He did not seek medical attention at the time.There was no facial droop or difficulty walking or swallowing.He decided to go to the ED on 09/21/14 for an evaluation as the slurred speech and tinnitus persisted.He denied weakness but ED exam revealed right arm weakness.CT of the head was performed, which revealed chronic small vessel ischemic changes, including remote right cerebellar, left pontine and right basal ganglia lacunar infarcts.No acute infarction was seen.  On 04/25/15, he developed right hand cramping and dystonia.When EMS arrived, he had jerking of the right hand that then generalized to a tonic-clonic seizure.He had two other seizures, in the ambulance and in the ED, lasting 30-40 seconds.CT of the head showed chronic small vessel disease and moderate brain atrophy.He was started on Keppra 500mg  twice daily.  He was admitted to [facility] on 04/29/15 for seizure, which was thought to be secondary to hyperglycemia with a blood glucose level of 662. MRI of the brain was withheld because he had a penile implant that contains metal. Keppra was increased to 1000mg  daily with 500mg  following each dialysis.  He was re-admitted to [facility] again on 07/11/15 for presumed acute  right pontine infarct. He presented with diplopia and unsteady gait. On exam, he was found to have one and a half syndrome. CT of head and repeat CT of head did not reveal acute infarct. 2D echo showed EF 45-50% with grade 1 diastolic dysfunction. Carotid doppler showed no hemodynamically significant ICA stenosis  He was admitted to [facility] from 10/27/16 to 10/29/16 for 2 to 3 days of persistent left leg weakness. He did not  have other associated symptoms, such as other extremity weakness, numbness, visual disturbance, unsteady gait or worsening of residual dysarthria. Serum glucose was 263. CT of head was personally reviewed and was negative for acute findings. Unable to obtain MRI due to metal penile implant, so repeat head the CT on the following day was also negative. Carotid doppler was negative for hemodynamically significant ICA stenosis. LDL was 50. Hgb A1c was elevated at 8.7. Weakness was thought to be due to hypotension during HD. He was advised to stop Lisinopril but otherwise continue is current regimen with dual antiplatelet therapy.  Objective:   There were no vitals filed for this visit.   Follow Up Instructions:      -I discussed the assessment and treatment plan with the patient. The patient was provided an opportunity to ask questions and all were answered. The patient agreed with the plan and demonstrated an understanding of the instructions.   The patient was advised to call back or seek an in-person evaluation if the symptoms worsen or if the condition fails to improve as anticipated.    Total Time spent in visit with the patient was:  12 minutes.   Dudley Major, DO

## 2020-10-25 ENCOUNTER — Telehealth (INDEPENDENT_AMBULATORY_CARE_PROVIDER_SITE_OTHER): Payer: Medicare (Managed Care) | Admitting: Neurology

## 2020-10-25 ENCOUNTER — Other Ambulatory Visit: Payer: Self-pay

## 2020-10-25 ENCOUNTER — Encounter: Payer: Self-pay | Admitting: Neurology

## 2020-10-25 DIAGNOSIS — I639 Cerebral infarction, unspecified: Secondary | ICD-10-CM

## 2020-10-25 DIAGNOSIS — R569 Unspecified convulsions: Secondary | ICD-10-CM | POA: Diagnosis not present

## 2020-10-25 DIAGNOSIS — I69398 Other sequelae of cerebral infarction: Secondary | ICD-10-CM | POA: Diagnosis not present

## 2020-11-03 ENCOUNTER — Emergency Department (HOSPITAL_COMMUNITY)
Admission: EM | Admit: 2020-11-03 | Discharge: 2020-11-18 | Disposition: A | Discharge status: Home / Self Care | Payer: Medicare (Managed Care) | Attending: Emergency Medicine | Admitting: Emergency Medicine

## 2020-11-03 DIAGNOSIS — Z87891 Personal history of nicotine dependence: Secondary | ICD-10-CM | POA: Diagnosis not present

## 2020-11-03 DIAGNOSIS — I2510 Atherosclerotic heart disease of native coronary artery without angina pectoris: Secondary | ICD-10-CM | POA: Diagnosis not present

## 2020-11-03 DIAGNOSIS — Z992 Dependence on renal dialysis: Secondary | ICD-10-CM | POA: Diagnosis not present

## 2020-11-03 DIAGNOSIS — Z7982 Long term (current) use of aspirin: Secondary | ICD-10-CM | POA: Insufficient documentation

## 2020-11-03 DIAGNOSIS — I132 Hypertensive heart and chronic kidney disease with heart failure and with stage 5 chronic kidney disease, or end stage renal disease: Secondary | ICD-10-CM | POA: Insufficient documentation

## 2020-11-03 DIAGNOSIS — F0150 Vascular dementia without behavioral disturbance: Secondary | ICD-10-CM | POA: Diagnosis not present

## 2020-11-03 DIAGNOSIS — Z79899 Other long term (current) drug therapy: Secondary | ICD-10-CM | POA: Diagnosis not present

## 2020-11-03 DIAGNOSIS — E1122 Type 2 diabetes mellitus with diabetic chronic kidney disease: Secondary | ICD-10-CM | POA: Diagnosis not present

## 2020-11-03 DIAGNOSIS — J449 Chronic obstructive pulmonary disease, unspecified: Secondary | ICD-10-CM | POA: Diagnosis not present

## 2020-11-03 DIAGNOSIS — Z20822 Contact with and (suspected) exposure to covid-19: Secondary | ICD-10-CM | POA: Insufficient documentation

## 2020-11-03 DIAGNOSIS — E1151 Type 2 diabetes mellitus with diabetic peripheral angiopathy without gangrene: Secondary | ICD-10-CM | POA: Insufficient documentation

## 2020-11-03 DIAGNOSIS — Z955 Presence of coronary angioplasty implant and graft: Secondary | ICD-10-CM | POA: Diagnosis not present

## 2020-11-03 DIAGNOSIS — I5043 Acute on chronic combined systolic (congestive) and diastolic (congestive) heart failure: Secondary | ICD-10-CM | POA: Diagnosis not present

## 2020-11-03 DIAGNOSIS — Z8673 Personal history of transient ischemic attack (TIA), and cerebral infarction without residual deficits: Secondary | ICD-10-CM | POA: Diagnosis not present

## 2020-11-03 DIAGNOSIS — Z794 Long term (current) use of insulin: Secondary | ICD-10-CM | POA: Diagnosis not present

## 2020-11-03 DIAGNOSIS — N186 End stage renal disease: Secondary | ICD-10-CM | POA: Insufficient documentation

## 2020-11-03 NOTE — ED Provider Notes (Signed)
[facility]ERG[facility] DEPARTMENT Provider Note   CSN: [id number] Arrival date & time: 11/03/20  2250     History Chief Complaint  Patient presents with   Social Work    Patient's wife passed today in [facility]. EMS was called by PD due to wife passing and her being patient's primary caregiver.     Ernest [name] is a 75 y.o. male.  Per report the patient's wife is his caregiver and she passed away in a car accident today.  His son lives in [place] and currently has [place] and cannot travel to help take care of him.  He is here for assistance.  Patient gets dialysis Monday Wednesday Friday.  Last had a on Wednesday.  He has no other complaints at this time.  Has residual stroke symptoms and understands the best we can do at this time is some type of placement.        Past Medical History:  Diagnosis Date   Abnormal stress test    s/p cath November 2013 with modest disease involving the ostial left main, proximal LAD, proximal RCA - do not appear to be hemodynamically signficant; mild LV dysfunction   Anemia    low iron   Anxiety    Arthritis    Bacteremia    Chronic systolic CHF (congestive heart failure) (HCC)    COPD (chronic obstructive pulmonary disease) ([place])    Coronary artery disease    a. per cath report 2013, b. 09/13/2017-CABG X4 & Mitral valve repair   Depression    DVT (deep venous thrombosis) (HCC)    arm - right- finished blood thinner- not sure when it   Ejection fraction < 50%    Erectile dysfunction    penile implant   ESRD (end stage renal disease) ([place])    [facility] on a MWF schedule.      Gout    Hx: of   Hepatitis C    History of seizures    last one 2018   Hyperlipidemia    Hypertension    Mitral valve disease    s/p Mitral repair 09/13/17   Obesity    Retinopathy    Shortness of breath    Hx: of with exertion   Stroke [facility])    "several" left side weakness, speech affected   Type II or unspecified  type diabetes mellitus without mention of complication, not stated as uncontrolled    adult onset    Patient Active Problem List   Diagnosis Date Noted   Pleural effusion on left 09/30/2019   Acute respiratory failure with hypoxia ([place]) 09/30/2019   Pleural effusion 09/30/2019   Chest pain 12/02/2018   Vascular dementia ([place]) 10/10/2017   Paroxysmal atrial flutter ([place]) 10/10/2017   Altered mental status, unspecified 10/09/2017   Other malaise 10/09/2017   S/P CABG x 4 10/04/2017   Cerebral embolism with cerebral infarction 09/20/2017   NSVT (nonsustained ventricular tachycardia) ([place])    Dyspnea 09/08/2017   Non-ST elevation MI (NSTEMI) ([name])    Acute on chronic combined systolic and diastolic heart failure ([place])    End-stage renal disease on hemodialysis ([place])    Left leg weakness    TIA (transient ischemic attack) 10/27/2016   Dependence on renal dialysis ([place]) 08/09/2015   History of CVA with residual deficit 08/07/2015   Seizure, late effect of stroke ([place]) 08/07/2015   Diabetic peripheral neuropathy ([place]) 08/07/2015   Unspecified protein-calorie malnutrition ([place]) 05/27/2015   Coagulation defect,  unspecified ([place]) 07/16/2014   Type 2 diabetes mellitus with diabetic peripheral angiopathy without gangrene ([place]) 07/01/2014   Pruritus, unspecified 05/26/2014   Elevated prostate specific antigen (PSA) 08/03/2013   Encounter for immunization 07/10/2013   Obstructive sleep apnea 06/16/2013   Hyperlipidemia associated with type 2 diabetes mellitus ([place])    ESRD (end stage renal disease) ([place])    Erectile dysfunction associated with type 2 diabetes mellitus ([place])    Hypertension due to end stage renal disease caused by type 2 diabetes mellitus, on dialysis [facility])    COPD (chronic obstructive pulmonary disease) ([place])    Coronary artery disease    Chronic combined systolic and diastolic CHF (congestive heart failure) ([place])    Anemia in chronic  kidney disease 12/27/2011   Long term (current) use of insulin ([place]) 11/03/2009   Gout, unspecified 02/28/2009   Iron deficiency anemia, unspecified 10/30/2008   Secondary hyperparathyroidism of renal origin ([place]) 10/30/2008   Uncontrolled type 2 diabetes mellitus with end-stage renal disease ([place]) 06/08/1984    Past Surgical History:  Procedure Laterality Date   A/V FISTULAGRAM N/A 10/04/2017   Procedure: A/V FISTULAGRAM;  Surgeon: Elam Dutch, MD;  Location: [facility];  Service: Cardiovascular;  Laterality: N/A;   A/V SHUNTOGRAM Left 07/12/2020   Procedure: A/V SHUNTOGRAM;  Surgeon: Serafina Mitchell, MD;  Location: [facility];  Service: Cardiovascular;  Laterality: Left;   ANGIOPLASTY Left 09/24/2019   Procedure: Angioplasty Innominate Vien;  Surgeon: Serafina Mitchell, MD;  Location: [facility];  Service: Vascular;  Laterality: Left;   AV FISTULA PLACEMENT Left 01/13/2013   Procedure: INSERTION OF ARTERIOVENOUS (AV) GORE-TEX GRAFT ARM;  Surgeon: Angelia Mould, MD;  Location: [facility];  Service: Vascular;  Laterality: Left;   AV FISTULA PLACEMENT Left 05/05/2013   Procedure: INSERTION OF LEFT UPPER ARM  ARTERIOVENOUS GORTEX GRAFT;  Surgeon: Angelia Mould, MD;  Location: [facility];  Service: Vascular;  Laterality: Left;   AV FISTULA PLACEMENT Left 11/26/2019   Procedure: INSERTION OF ARTERIOVENOUS (AV) GORE-TEX GRAFT, left  ARM;  Surgeon: Serafina Mitchell, MD;  Location: [facility];  Service: Vascular;  Laterality: Left;   [place] REMOVAL Left 03/26/2013   Procedure: REMOVAL OF  NON INCORPORATED ARTERIOVENOUS GORETEX GRAFT ([place]) left arm * repair of  left brachial artery with vein patch angioplasty.;  Surgeon: Angelia Mould, MD;  Location: [facility];  Service: Vascular;  Laterality: Left;   CARDIAC CATHETERIZATION  August 26, 2012   CATARACT EXTRACTION W/ INTRAOCULAR LENS  IMPLANT, BILATERAL     COLONOSCOPY     CORONARY ARTERY BYPASS GRAFT N/A 09/13/2017    Procedure: CORONARY ARTERY BYPASS GRAFTING (CABG) times four LIMA  to LAD, SVG sequentially to OM and RAMUS Intermediate and SVG to Acute Marginal;  Surgeon: Melrose Nakayama, MD;  Location: [facility];  Service: Open Heart Surgery;  Laterality: N/A;   EMBOLECTOMY Right 08/27/2013   Procedure: EMBOLECTOMY;  Surgeon: Mal Misty, MD;  Location: [facility];  Service: Vascular;  Laterality: Right;  Thrombectomy of Radial and ulnar artery.   ENDOVASCULAR STENT INSERTION Left 09/24/2019   Procedure: Endovascular Stent Graft Insertion, Arm Graft;  Surgeon: Serafina Mitchell, MD;  Location: [facility] OR;  Service: Vascular;  Laterality: Left;   EYE SURGERY     laser.  and surgery for DM   fistula     RUE and wrist   FISTULOGRAM Left 09/24/2019   Procedure: SHUNTOGRAM OF ARTERIOVENOUS FISTULA LEFT ARM,;  Surgeon: Serafina Mitchell, MD;  Location: [facility];  Service: Vascular;  Laterality: Left;   INSERTION OF DIALYSIS CATHETER Right 01/01/2013   Procedure: INSERTION OF DIALYSIS CATHETER right  internal jugular;  Surgeon: Serafina Mitchell, MD;  Location: [facility];  Service: Vascular;  Laterality: Right;   INSERTION OF DIALYSIS CATHETER N/A 03/26/2013   Procedure: INSERTION OF DIALYSIS CATHETER Left internal jugular vein;  Surgeon: Angelia Mould, MD;  Location: [facility];  Service: Vascular;  Laterality: N/A;   INSERTION OF DIALYSIS CATHETER Right 11/26/2019   Procedure: INSERTION OF DIALYSIS CATHETER, right internal jugular;  Surgeon: Serafina Mitchell, MD;  Location: [facility];  Service: Vascular;  Laterality: Right;   IR THORACENTESIS ASP PLEURAL SPACE W/IMG GUIDE  09/30/2019   LEFT HEART CATH AND CORONARY ANGIOGRAPHY N/A 09/09/2017   Procedure: LEFT HEART CATH AND CORONARY ANGIOGRAPHY;  Surgeon: Troy Sine, MD;  Location: [facility];  Service: Cardiovascular;  Laterality: N/A;   LIGATION OF ARTERIOVENOUS  FISTULA Right 12/30/2013   Procedure: REMOVAL OF SEGMENT OF GORTEX GRAFT AND FISTULA  AND  REPAIR OF BRACHIAL ARTERY;  Surgeon: Mal Misty, MD;  Location: [facility];  Service: Vascular;  Laterality: Right;   MITRAL VALVE REPAIR N/A 09/13/2017   Procedure: MITRAL VALVE REPAIR (MVR);  Surgeon: Melrose Nakayama, MD;  Location: [facility];  Service: Open Heart Surgery;  Laterality: N/A;   MITRAL VALVE REPAIR (MV)/CORONARY ARTERY BYPASS GRAFTING (CABG)  09/13/2017   PENILE PROSTHESIS IMPLANT     1997.. no card   PERIPHERAL VASCULAR BALLOON ANGIOPLASTY Left 10/04/2017   Procedure: PERIPHERAL VASCULAR BALLOON ANGIOPLASTY;  Surgeon: Elam Dutch, MD;  Location: [facility];  Service: Cardiovascular;  Laterality: Left;  AVF   PERIPHERAL VASCULAR BALLOON ANGIOPLASTY Left 07/12/2020   Procedure: PERIPHERAL VASCULAR BALLOON ANGIOPLASTY;  Surgeon: Serafina Mitchell, MD;  Location: [facility] CV LAB;  Service: Cardiovascular;  Laterality: Left;  ARM SHUNTOGRAM   REVISION OF ARTERIOVENOUS GORETEX GRAFT Left 09/24/2019   Procedure: REVISION OF ARTERIOVENOUS GORETEX GRAFT LEFT ARM;  Surgeon: Serafina Mitchell, MD;  Location[facility];  Service: Vascular;  Laterality: Left;   TEE WITHOUT CARDIOVERSION N/A 06/11/2013   Procedure: TRANSESOPHAGEAL ECHOCARDIOGRAM (TEE);  Surgeon: Larey Dresser, MD;  Location: [facility] ENDOSCOPY;  Service: Cardiovascular;  Laterality: N/A;   TEE WITHOUT CARDIOVERSION N/A 09/13/2017   Procedure: TRANSESOPHAGEAL ECHOCARDIOGRAM (TEE);  Surgeon: Melrose Nakayama, MD;  Location: [facility];  Service: Open Heart Surgery;  Laterality: N/A;   THROMBECTOMY W/ EMBOLECTOMY Left 03/26/2013   Procedure: Attempted thrombectomy of left arm arteriovenous goretex graft.;  Surgeon: Angelia Mould, MD;  Location: [facility] OR;  Service: Vascular;  Laterality: Left;   ULTRASOUND GUIDANCE FOR VASCULAR ACCESS Bilateral 09/24/2019   Procedure: Ultrasound Guidance For Vascular Access, Right Femoral Vein and Left Arm Graft;  Surgeon: Serafina Mitchell, MD;  Location: [facility] OR;  Service:  Vascular;  Laterality: Bilateral;   VENOGRAM N/A 04/07/2013   Procedure: VENOGRAM;  Surgeon: Serafina Mitchell, MD;  Location: [facility] CATH LAB;  Service: Cardiovascular;  Laterality: N/A;       Family History  Problem Relation Age of Onset   Heart disease Father    Diabetes Sister    Diabetes Brother    Diabetes Son     Social History   Tobacco Use   Smoking status: Former Smoker    Years: 7.00    Quit date: 06/10/1973    Years since quitting:  47.4   Smokeless tobacco: Never Used   Tobacco comment: Quit in 1974.  Vaping Use   Vaping Use: Never used  Substance Use Topics   Alcohol use: No    Alcohol/week: 0.0 standard drinks   Drug use: No    Home Medications Prior to Admission medications   Medication Sig Start Date End Date Taking? Authorizing Provider  acetaminophen (TYLENOL) 500 MG tablet Take 500-1,000 mg by mouth every 6 (six) hours as needed (for pain/toothache).    [provider]  allopurinol (ZYLOPRIM) 100 MG tablet Take 100 mg by mouth daily. 05/30/20   [provider]  aspirin EC 81 MG tablet Take 1 tablet (81 mg total) by mouth daily. 11/29/15   Rivet, Sindy Guadeloupe, MD  atorvastatin (LIPITOR) 40 MG tablet Take 40 mg by mouth daily.     [provider]  B Complex-C-Folic Acid (RENA-VITE RX) 1 MG TABS Take 1 tablet by mouth daily. 07/10/19   [provider]  BD PEN NEEDLE NANO U/F 32G X 4 MM MISC Inject into the skin as directed. 06/19/20   [provider]  calcium acetate (PHOSLO) 667 MG capsule Take 1,334 mg by mouth 3 (three) times daily with meals.     [provider]  cinacalcet (SENSIPAR) 60 MG tablet Take 60 mg by mouth daily.    [provider]  cyproheptadine (PERIACTIN) 2 MG/5ML syrup Take 2 mg by mouth 3 (three) times daily with meals.     [provider]  Darbepoetin Alfa (ARANESP) 100 MCG/0.5ML SOSY injection Inject 100 mcg into the skin every 7 (seven) days. Administered with dialysis     [provider]  diclofenac Sodium (VOLTAREN) 1 % GEL Apply 1 application topically 4 (four) times daily as needed (pain).     [provider]  doxercalciferol (HECTOROL) 4 MCG/2ML injection Inject 2 mcg into the vein every Monday, Wednesday, and Friday with hemodialysis. Administered at dialysis    [provider]  doxercalciferol (HECTOROL) 4 MCG/2ML injection Doxercalciferol (Hectorol) 01/04/20 01/02/21  [provider]  ferric gluconate 125 mg in sodium chloride 0.9 % 100 mL Inject 250 mg into the vein every Monday, Wednesday, and Friday with hemodialysis.     [provider]  heparin 1000 unit/mL SOLN injection Heparin Sodium (Porcine) 1,000 Units/mL Catheter Lock Arterial 11/27/19 11/25/20  [provider]  insulin detemir (LEVEMIR) 100 UNIT/ML injection Inject 0.2 mLs (20 Units total) into the skin 2 (two) times daily. Patient taking differently: Inject 20 Units into the skin daily. 10/07/17   Man Giovanni, [name]  iron sucrose (VENOFER) 20 MG/ML injection Iron Sucrose (Venofer) 12/02/19 11/30/20  [provider]  levETIRAcetam (KEPPRA) 500 MG tablet Take 2 tablets (1,000 mg total) by mouth daily. Then take extra 500 mg (1 tablet) on MWF after HD Patient taking differently: Take 500 mg by mouth See admin instructions. Take 1 tablet (500 mg) by mouth twice daily on Sundays, Tuesdays, Thursdays, & Saturdays. (Morning & At supper) Take 1 tablet (500 mg) by mouth 3 times daily on Mondays, Wednesday, & Fridays (Morning, After dialysis, & at supper) 01/20/19   Pieter Partridge, DO  linaclotide (LINZESS) 145 MCG CAPS capsule Take 145 mcg by mouth daily as needed (constipation).     [provider]  Methoxy PEG-Epoetin Beta (MIRCERA IJ) Mircera 11/27/19 04/26/21  [provider]  metoprolol tartrate (LOPRESSOR) 50 MG tablet Take 1 tablet (50 mg total) by mouth 2 (two) times daily. 03/11/18 11/19/20  Daune Perch, NP  mirtazapine  (REMERON) 7.5 MG tablet Take 7.5 mg by mouth at bedtime.     [provider]  Nutritional Supplements (FEEDING SUPPLEMENT, NEPRO CARB STEADY,) LIQD Take 237 mLs by mouth 2 (two) times daily between meals. Patient taking differently: Take 237 mLs by mouth daily as needed (nutritional supplement). 10/07/17   Gold, Wilder Glade, [name]  Polyethyl Glycol-Propyl Glycol 0.4-0.3 % SOLN Place 1 drop into both eyes 3 (three) times daily as needed (dry/irritated eyes.).    [provider]  sertraline (ZOLOFT) 25 MG tablet Take 25 mg by mouth at bedtime.    [provider]  SUPER B COMPLEX/C PO Take 1 capsule by mouth daily.    [provider]    Allergies    Patient has no known allergies.  Review of Systems   Review of Systems  All other systems reviewed and are negative.   Physical Exam Updated Vital Signs There were no vitals taken for this visit.  Physical Exam Vitals and nursing note reviewed.  Constitutional:      Appearance: He is well-developed and well-nourished.  HENT:     Head: Normocephalic and atraumatic.     Nose: No congestion or rhinorrhea.     Mouth/Throat:     Mouth: Mucous membranes are moist.     Pharynx: Oropharynx is clear.  Cardiovascular:     Rate and Rhythm: Normal rate.  Pulmonary:     Effort: Pulmonary effort is normal. No respiratory distress.  Abdominal:     General: Abdomen is flat. There is no distension.  Musculoskeletal:        General: Normal range of motion.     Cervical back: Normal range of motion.  Skin:    General: Skin is warm and dry.  Neurological:     Mental Status: He is alert. Mental status is at baseline.     ED Results / Procedures / Treatments   Labs (all labs ordered are listed, but only abnormal results are displayed) Labs Reviewed - No data to display  EKG None  Radiology No results found.  Procedures Procedures (including critical care time)  Medications Ordered in ED Medications - No  data to display  ED Course  I have reviewed the triage vital signs and the nursing notes.  Pertinent labs & imaging results that were available during my care of the patient were reviewed by me and considered in my medical decision making (see chart for details).    MDM Rules/Calculators/A&P                          At baseline. No medical complaints.  Home meds ordered Discussed with Dr. Marval Regal who will put on list for dialysis in the morning. Consult to [facility] team placed.  Final Clinical Impression(s) / ED Diagnoses Final diagnoses:  None    Rx / DC Orders ED Discharge Orders    None       Teaira Croft, Corene Cornea, MD 11/04/20 [phone]

## 2020-11-03 NOTE — ED Triage Notes (Signed)
Patient presents from home by EMS due to PD calling them for patient having no caregiver at home due to his wife passing away during a MVC today. Patient has no complaints. Patient is HD patient with access in Lt arm. Speech slurred and Lt sided weakness at baseline. Patient here for social work consult. Closest family, son, is in [place] but currently COVID + and unable to come.

## 2020-11-03 NOTE — ED Notes (Signed)
Per Dr. Dayna Barker - he spoke with renal team and they are to get patient scheduled for HD tomorrow.

## 2020-11-03 NOTE — ED Notes (Signed)
Called and spoke with pharmacy regarding need for med rec.

## 2020-11-04 LAB — CBG MONITORING, ED: Glucose-Capillary: 106 mg/dL — ABNORMAL HIGH (ref 70–99)

## 2020-11-04 MED ORDER — ACETAMINOPHEN 500 MG PO TABS: 500.0000 mg | ORAL_TABLET | Freq: Four times a day (QID) | ORAL | Status: DC | PRN

## 2020-11-04 MED ORDER — SERTRALINE HCL 25 MG PO TABS
25.0000 mg | ORAL_TABLET | Freq: Every day | ORAL | Status: DC
  Administered 2020-11-04 – 2020-11-17 (×13): 25 mg via ORAL
  Filled 2020-11-04 (×15): qty 1

## 2020-11-04 MED ORDER — CINACALCET HCL 30 MG PO TABS
60.0000 mg | ORAL_TABLET | Freq: Every day | ORAL | Status: DC
  Administered 2020-11-04 – 2020-11-12 (×7): 60 mg via ORAL
  Filled 2020-11-04 (×13): qty 2

## 2020-11-04 MED ORDER — LEVETIRACETAM 500 MG PO TABS
500.0000 mg | ORAL_TABLET | ORAL | Status: DC
  Administered 2020-11-05 – 2020-11-17 (×16): 500 mg via ORAL
  Filled 2020-11-04 (×18): qty 1

## 2020-11-04 MED ORDER — AMIODARONE HCL 200 MG PO TABS
200.0000 mg | ORAL_TABLET | Freq: Every day | ORAL | Status: DC
  Administered 2020-11-04 – 2020-11-18 (×14): 200 mg via ORAL
  Filled 2020-11-04 (×14): qty 1

## 2020-11-04 MED ORDER — ATORVASTATIN CALCIUM 40 MG PO TABS
40.0000 mg | ORAL_TABLET | Freq: Every day | ORAL | Status: DC
  Administered 2020-11-04 – 2020-11-18 (×15): 40 mg via ORAL
  Filled 2020-11-04 (×15): qty 1

## 2020-11-04 MED ORDER — ALLOPURINOL 100 MG PO TABS
100.0000 mg | ORAL_TABLET | Freq: Every day | ORAL | Status: DC
  Administered 2020-11-04 – 2020-11-18 (×15): 100 mg via ORAL
  Filled 2020-11-04 (×16): qty 1

## 2020-11-04 MED ORDER — POLYVINYL ALCOHOL 1.4 % OP SOLN: 1.0000 [drp] | Freq: Three times a day (TID) | OPHTHALMIC | Status: DC | PRN

## 2020-11-04 MED ORDER — CALCIUM ACETATE (PHOS BINDER) 667 MG PO CAPS
1334.0000 mg | ORAL_CAPSULE | Freq: Three times a day (TID) | ORAL | Status: DC
  Administered 2020-11-04 – 2020-11-06 (×7): 1334 mg via ORAL
  Filled 2020-11-04 (×6): qty 2

## 2020-11-04 MED ORDER — LEVETIRACETAM 500 MG PO TABS
500.0000 mg | ORAL_TABLET | ORAL | Status: DC
  Administered 2020-11-04 – 2020-11-18 (×18): 500 mg via ORAL
  Filled 2020-11-04 (×15): qty 1

## 2020-11-04 MED ORDER — LEVETIRACETAM 500 MG PO TABS: 500.0000 mg | ORAL_TABLET | ORAL | Status: DC

## 2020-11-04 MED ORDER — INSULIN DETEMIR 100 UNIT/ML ~~LOC~~ SOLN
20.0000 [IU] | Freq: Every day | SUBCUTANEOUS | Status: DC
  Administered 2020-11-04 – 2020-11-18 (×13): 20 [IU] via SUBCUTANEOUS
  Filled 2020-11-04 (×19): qty 0.2

## 2020-11-04 MED ORDER — MIRTAZAPINE 15 MG PO TABS
7.5000 mg | ORAL_TABLET | Freq: Every day | ORAL | Status: DC
  Administered 2020-11-04 – 2020-11-17 (×13): 7.5 mg via ORAL
  Filled 2020-11-04 (×14): qty 1

## 2020-11-04 MED ORDER — ASPIRIN EC 81 MG PO TBEC
81.0000 mg | DELAYED_RELEASE_TABLET | Freq: Every day | ORAL | Status: DC
  Administered 2020-11-04 – 2020-11-18 (×15): 81 mg via ORAL
  Filled 2020-11-04 (×15): qty 1

## 2020-11-04 MED ORDER — CYPROHEPTADINE HCL 4 MG PO TABS
2.0000 mg | ORAL_TABLET | Freq: Three times a day (TID) | ORAL | Status: DC
  Administered 2020-11-04 – 2020-11-18 (×34): 2 mg via ORAL
  Filled 2020-11-04 (×54): qty 1

## 2020-11-04 MED ORDER — CHLORHEXIDINE GLUCONATE CLOTH 2 % EX PADS
6.0000 | MEDICATED_PAD | Freq: Every day | CUTANEOUS | Status: DC
  Administered 2020-11-08: 6 via TOPICAL

## 2020-11-04 NOTE — ED Notes (Signed)
Lunch Tray Ordered @ 1046. 

## 2020-11-04 NOTE — NC FL2 (Signed)
[place] LEVEL OF CARE SCREENING TOOL     IDENTIFICATION  Patient Name: Ernest [name] Birthdate: 1946-01-08 Sex: male Admission Date (Current Location): 11/03/2020  [facility] and [place] Number:  Herbalist and Address:  [facility] [place], [place] [address] [name], [place] [address]      Provider Number: [id number]  Attending Physician Name and Address:  Default, Provider, MD  Relative Name and Phone Number:  [name] Daughter [phone]    Current Level of Care: Hospital Recommended Level of Care: [place] Prior Approval Number:    Date Approved/Denied:   PASRR Number: Pending  Discharge Plan: SNF    Current Diagnoses: Patient Active Problem List   Diagnosis Date Noted   Pleural effusion on left 09/30/2019   Acute respiratory failure with hypoxia ([place]) 09/30/2019   Pleural effusion 09/30/2019   Chest pain 12/02/2018   Vascular dementia ([place]) 10/10/2017   Paroxysmal atrial flutter ([place]) 10/10/2017   Altered mental status, unspecified 10/09/2017   Other malaise 10/09/2017   S/P CABG x 4 10/04/2017   Cerebral embolism with cerebral infarction 09/20/2017   NSVT (nonsustained ventricular tachycardia) ([place])    Dyspnea 09/08/2017   Non-ST elevation MI (NSTEMI) ([place])    Acute on chronic combined systolic and diastolic heart failure ([place])    End-stage renal disease on hemodialysis ([place])    Left leg weakness    TIA (transient ischemic attack) 10/27/2016   Dependence on renal dialysis ([place]) 08/09/2015   History of CVA with residual deficit 08/07/2015   Seizure, late effect of stroke ([place]) 08/07/2015   Diabetic peripheral neuropathy ([place]) 08/07/2015   Unspecified protein-calorie malnutrition ([place]) 05/27/2015   Coagulation defect, unspecified ([place]) 07/16/2014   Type 2 diabetes mellitus with diabetic peripheral angiopathy without gangrene ([place]) 07/01/2014   Pruritus, unspecified  05/26/2014   Elevated prostate specific antigen (PSA) 08/03/2013   Encounter for immunization 07/10/2013   Obstructive sleep apnea 06/16/2013   Hyperlipidemia associated with type 2 diabetes mellitus ([place])    ESRD (end stage renal disease) ([place])    Erectile dysfunction associated with type 2 diabetes mellitus ([place])    Hypertension due to end stage renal disease caused by type 2 diabetes mellitus, on dialysis ([place])    COPD (chronic obstructive pulmonary disease) ([place])    Coronary artery disease    Chronic combined systolic and diastolic CHF (congestive heart failure) (HCC)    Anemia in chronic kidney disease 12/27/2011   Long term (current) use of insulin ([place]) 11/03/2009   Gout, unspecified 02/28/2009   Iron deficiency anemia, unspecified 10/30/2008   Secondary hyperparathyroidism of renal origin ([place]) 10/30/2008   Uncontrolled type 2 diabetes mellitus with end-stage renal disease ([place]) 06/08/1984    Orientation RESPIRATION BLADDER Height & Weight     Self,Time,Situation,Place  Normal Incontinent Weight:   Height:     BEHAVIORAL SYMPTOMS/MOOD NEUROLOGICAL BOWEL NUTRITION STATUS      Incontinent Diet (Renal diet)  AMBULATORY STATUS COMMUNICATION OF NEEDS Skin   Extensive Assist Verbally Normal                       Personal Care Assistance Level of Assistance  Bathing,Feeding,Dressing Bathing Assistance: Limited assistance Feeding assistance: Independent Dressing Assistance: Limited assistance     Functional Limitations Info  Sight,Hearing,Speech Sight Info: Adequate Hearing Info: Adequate Speech Info: Adequate    SPECIAL CARE FACTORS FREQUENCY  PT (By licensed PT),OT (By licensed OT) (HD 3x weekly (MWF Schedule))  PT Frequency: 5x weekly OT Frequency: 5x weekly            Contractures Contractures Info: Not present    Additional Factors Info  Code Status Code Status Info: Full             Current Medications (11/04/2020):  This  is the current hospital active medication list Current Facility-Administered Medications  Medication Dose Route Frequency Provider Last Rate Last Admin   0.9 %  sodium chloride infusion  250 mL Intravenous PRN Serafina Mitchell, MD       acetaminophen (TYLENOL) tablet 500-1,000 mg  500-1,000 mg Oral Q6H PRN Mesner, Corene Cornea, MD       allopurinol (ZYLOPRIM) tablet 100 mg  100 mg Oral Daily Mesner, Jason, MD   100 mg at 11/04/20 [id number]   amiodarone (PACERONE) tablet 200 mg  200 mg Oral Daily Mesner, Corene Cornea, MD   200 mg at 11/04/20 [id number]   aspirin EC tablet 81 mg  81 mg Oral Daily Mesner, Jason, MD   81 mg at 11/04/20 0926   atorvastatin (LIPITOR) tablet 40 mg  40 mg Oral Daily Mesner, Jason, MD   40 mg at 11/04/20 0926   calcium acetate (PHOSLO) capsule 1,334 mg  1,334 mg Oral TID WC Mesner, Corene Cornea, MD   1,334 mg at 11/04/20 1805   Chlorhexidine Gluconate Cloth 2 % PADS 6 each  6 each Topical Q0600 Penninger, Ria Comment, PA       cinacalcet (SENSIPAR) tablet 60 mg  60 mg Oral Q breakfast Mesner, Jason, MD   60 mg at 11/04/20 [id number]   cyproheptadine (PERIACTIN) 4 MG tablet 2 mg  2 mg Oral TID WC Mesner, Corene Cornea, MD   2 mg at 11/04/20 1816   insulin detemir (LEVEMIR) injection 20 Units  20 Units Subcutaneous Daily Mesner, Jason, MD   20 Units at 11/04/20 1007   [START ON 11/05/2020] levETIRAcetam (KEPPRA) tablet 500 mg  500 mg Oral 2 times per day on Sun Tue Thu Sat Mesner, Corene Cornea, MD       levETIRAcetam (KEPPRA) tablet 500 mg  500 mg Oral 3 times per day on Mon Wed Fri Mesner, Corene Cornea, MD   500 mg at 11/04/20 1824   mirtazapine (REMERON) tablet 7.5 mg  7.5 mg Oral QHS Mesner, Jason, MD   7.5 mg at 11/04/20 0203   polyvinyl alcohol (LIQUIFILM TEARS) 1.4 % ophthalmic solution 1 drop  1 drop Both Eyes TID PRN Mesner, Corene Cornea, MD       sertraline (ZOLOFT) tablet 25 mg  25 mg Oral QHS Mesner, Jason, MD   25 mg at 11/04/20 0204   Current Outpatient Medications  Medication Sig Dispense Refill   allopurinol  (ZYLOPRIM) 100 MG tablet Take 100 mg by mouth daily. Lf 08/22/20 90 day supply     amiodarone (PACERONE) 200 MG tablet Take 200 mg by mouth daily.     atorvastatin (LIPITOR) 40 MG tablet Take 40 mg by mouth daily.      B Complex-C-Folic Acid (RENA-VITE RX) 1 MG TABS Take 1 tablet by mouth daily.     BD PEN NEEDLE NANO U/F 32G X 4 MM MISC Inject into the skin as directed.     calcium acetate (PHOSLO) 667 MG capsule Take 1,334 mg by mouth 3 (three) times daily with meals.      cinacalcet (SENSIPAR) 60 MG tablet Take 60 mg by mouth daily.     Darbepoetin Alfa (ARANESP) 100 MCG/0.5ML SOSY injection Inject 100 mcg into  the skin every 7 (seven) days. Administered with dialysis     doxercalciferol (HECTOROL) 4 MCG/2ML injection Inject 2 mcg into the vein every Monday, Wednesday, and Friday with hemodialysis. Administered at dialysis     doxercalciferol (HECTOROL) 4 MCG/2ML injection Doxercalciferol (Hectorol)     ferric gluconate 125 mg in sodium chloride 0.9 % 100 mL Inject 250 mg into the vein every Monday, Wednesday, and Friday with hemodialysis.      heparin 1000 unit/mL SOLN injection Heparin Sodium (Porcine) 1,000 Units/mL Catheter Lock Arterial     insulin detemir (LEVEMIR) 100 UNIT/ML injection Inject 0.2 mLs (20 Units total) into the skin 2 (two) times daily. (Patient taking differently: Inject 20 Units into the skin daily.) 10 mL 11   iron sucrose (VENOFER) 20 MG/ML injection Iron Sucrose (Venofer)     levETIRAcetam (KEPPRA) 500 MG tablet Take 2 tablets (1,000 mg total) by mouth daily. Then take extra 500 mg (1 tablet) on MWF after HD (Patient taking differently: Take 500 mg by mouth See admin instructions. Take 1 tablet (500 mg) by mouth twice daily on Sundays, Tuesdays, Thursdays, & Saturdays. (Morning & At supper) Take 1 tablet (500 mg) by mouth 3 times daily on Mondays, Wednesday, & Fridays (Morning, After dialysis, & at supper)) 204 tablet 3   Methoxy PEG-Epoetin Beta (MIRCERA  IJ) Mircera     metoprolol tartrate (LOPRESSOR) 50 MG tablet Take 1 tablet (50 mg total) by mouth 2 (two) times daily. 180 tablet 3   mirtazapine (REMERON) 7.5 MG tablet Take 7.5 mg by mouth at bedtime.      sertraline (ZOLOFT) 25 MG tablet Take 25 mg by mouth at bedtime.     acetaminophen (TYLENOL) 500 MG tablet Take 500-1,000 mg by mouth every 6 (six) hours as needed (for pain/toothache).     aspirin EC 81 MG tablet Take 1 tablet (81 mg total) by mouth daily. 30 tablet 11   cyproheptadine (PERIACTIN) 2 MG/5ML syrup Take 2 mg by mouth 3 (three) times daily with meals.      diclofenac Sodium (VOLTAREN) 1 % GEL Apply 1 application topically 4 (four) times daily as needed (pain).      linaclotide (LINZESS) 145 MCG CAPS capsule Take 145 mcg by mouth daily as needed (constipation).      Nutritional Supplements (FEEDING SUPPLEMENT, NEPRO CARB STEADY,) LIQD Take 237 mLs by mouth 2 (two) times daily between meals. (Patient taking differently: Take 237 mLs by mouth daily as needed (nutritional supplement).)  0   Polyethyl Glycol-Propyl Glycol 0.4-0.3 % SOLN Place 1 drop into both eyes 3 (three) times daily as needed (dry/irritated eyes.).     SUPER B COMPLEX/C PO Take 1 capsule by mouth daily.       Discharge Medications: Please see discharge summary for a list of discharge medications.  Relevant Imaging Results:  Relevant Lab Results:   Additional Information SS#: [id number]; dialysis MWF at [facility];  Pt has had 2 Covid vaccines  Kelby Adell, LCSW

## 2020-11-04 NOTE — ED Notes (Signed)
Patient sitting upright on side of bed eating lunch

## 2020-11-04 NOTE — Progress Notes (Signed)
Physical Therapy Evaluation Patient Details Name: Ernest [name] MRN: [id number] DOB: January 30, 1946 Today's Date: 11/04/2020   History of Present Illness  Pt is a 75 y/o male presenting to the hospital for SNF placement as his wife who was his primary caregiver was killed in an MVC. PMH includes HTN, CVA with L sided deficits, ESRD on HD, COPD, CAD, DVT, and Hep C.  Clinical Impression  Pt admitted secondary to problem above with deficits below. Pt requiring min A for transfers and short distance gait. Pt with L sided deficits at baseline. Per notes, pt no longer has a caregiver as his wife was killed in an MVC. Recommending SNF level therapies and care to increase independence and safety. Will continue to follow acutely.     Follow Up Recommendations SNF;Supervision/Assistance - 24 hour    Equipment Recommendations  None recommended by PT    Recommendations for Other Services       Precautions / Restrictions Precautions Precautions: Fall Restrictions Weight Bearing Restrictions: No      Mobility  Bed Mobility Overal bed mobility: Needs Assistance Bed Mobility: Supine to Sit;Sit to Supine     Supine to sit: Min guard Sit to supine: Min guard   General bed mobility comments: Min guard for safety.    Transfers Overall transfer level: Needs assistance Equipment used: Rolling walker (2 wheeled) Transfers: Sit to/from Stand Sit to Stand: Min assist         General transfer comment: Min A for lift assist and steadying.  Ambulation/Gait Ambulation/Gait assistance: Min assist Gait Distance (Feet): 8 Feet Assistive device: Rolling walker (2 wheeled) Gait Pattern/deviations: Step-through pattern;Decreased stride length Gait velocity: Decreased   General Gait Details: Min A for steadying. Decreased coordination in LLE when taking steps.  Stairs            Wheelchair Mobility    Modified Rankin (Stroke Patients Only)       Balance Overall balance assessment:  Needs assistance Sitting-balance support: No upper extremity supported Sitting balance-Leahy Scale: Fair     Standing balance support: Bilateral upper extremity supported Standing balance-Leahy Scale: Poor Standing balance comment: Reliant on UE and external support                             Pertinent Vitals/Pain Pain Assessment: No/denies pain    Home Living Family/patient expects to be discharged to:: Skilled nursing facility                      Prior Function Level of Independence: Needs assistance   Gait / Transfers Assistance Needed: Pt only able to ambulate short distance with RW.  ADL's / Homemaking Assistance Needed: Required assist for bathing, dressing, and IADLs.        Hand Dominance        Extremity/Trunk Assessment   Upper Extremity Assessment Upper Extremity Assessment: LUE deficits/detail LUE Deficits / Details: LUE weakness at baseline LUE Sensation: history of peripheral neuropathy (in hands)    Lower Extremity Assessment Lower Extremity Assessment: Generalized weakness;LLE deficits/detail (peripheral neuropathy in bilateral feet) LLE Deficits / Details: LLE weakness at baseline    Cervical / Trunk Assessment Cervical / Trunk Assessment: Normal  Communication   Communication: Expressive difficulties;HOH  Cognition Arousal/Alertness: Awake/alert Behavior During Therapy: WFL for tasks assessed/performed Overall Cognitive Status: No family/caregiver present to determine baseline cognitive functioning  General Comments: Likely close to his baseline. Required increased time to respond to questions.      General Comments General comments (skin integrity, edema, etc.): Reports blurriness in L eye that has been going on for ~4 months.    Exercises     Assessment/Plan    PT Assessment Patient needs continued PT services  PT Problem List Decreased strength;Decreased  mobility;Decreased activity tolerance;Decreased balance;Decreased cognition;Decreased coordination;Decreased knowledge of use of DME;Decreased safety awareness;Decreased knowledge of precautions       PT Treatment Interventions DME instruction;Gait training;Functional mobility training;Therapeutic activities;Therapeutic exercise;Balance training;Neuromuscular re-education;Cognitive remediation;Patient/family education    PT Goals (Current goals can be found in the Care Plan section)  Acute Rehab PT Goals Patient Stated Goal: none stated PT Goal Formulation: With patient Time For Goal Achievement: 11/18/20 Potential to Achieve Goals: Good    Frequency Min 2X/week   Barriers to discharge Decreased caregiver support Does not have a caregiver    Co-evaluation               AM-PAC PT "6 Clicks" Mobility  Outcome Measure Help needed turning from your back to your side while in a flat bed without using bedrails?: A Little Help needed moving from lying on your back to sitting on the side of a flat bed without using bedrails?: A Little Help needed moving to and from a bed to a chair (including a wheelchair)?: A Little Help needed standing up from a chair using your arms (e.g., wheelchair or bedside chair)?: A Little Help needed to walk in hospital room?: A Little Help needed climbing 3-5 steps with a railing? : A Lot 6 Click Score: 17    End of Session Equipment Utilized During Treatment: Gait belt Activity Tolerance: Patient tolerated treatment well Patient left: in bed;with call bell/phone within reach (in bed in ED) Nurse Communication: Mobility status PT Visit Diagnosis: Unsteadiness on feet (R26.81);Muscle weakness (generalized) (M62.81);Difficulty in walking, not elsewhere classified (R26.2)    Time: 1051-1105 PT Time Calculation (min) (ACUTE ONLY): 14 min   Charges:   PT Evaluation $PT Eval Moderate Complexity: 1 Mod          Ernest [name], PT, DPT  Acute  Rehabilitation Services  Pager: [phone] Office: [phone]   Ernest [name] 11/04/2020, 2:11 PM

## 2020-11-04 NOTE — ED Notes (Signed)
Breakfast Tray Ordered @ [id number].

## 2020-11-04 NOTE — ED Notes (Signed)
The pt was pulled up in bed at his request  He can help himself

## 2020-11-04 NOTE — Progress Notes (Signed)
Brief Progress Note [facility]  KYMERE KLIEWER [id number] [birth year]   ESRD patient on HD MWF at [facility].  Currently boarding in the ED due to the death of his primary caregiver (his wife in a car accident yesterday) while awaiting SNF placement.  Orders written for dialysis today per regular schedule.  Treatment truncated due to high patient volume and staffing issues.  Will continue to follow and write dialysis orders while admitted.   OP Dialysis Orders: MWF [facility] 4.25hrs 450/800 84kg 2K 2Ca AVG Sensipar '30mg'$  qHD Hectorol 95mg qHD Heparin 4000 Venofer '100mg'$  qwk  LJen Mow [name] [facility] Pager: [phone]

## 2020-11-04 NOTE — Progress Notes (Signed)
CSW faxed referrals to several area SNFs

## 2020-11-04 NOTE — Progress Notes (Signed)
Assigned APS worker is Mr. Nyoka Cowden [phone]. If anything is changed  (placement needs) CSW has been asked to call number above to notify APS worker of it. CSW understanding and will follow for further placement needs.   Virgie Dad Colsen Modi, MSW, LCSW [facility] [place] [facility] [place] [phone]

## 2020-11-04 NOTE — Progress Notes (Addendum)
CSW spoke with daughter Heather '@[phone]'$ [name] on a 3-way call with APS case worker.   Family is in agreement to seek SNF placement. Daughter will arrive in [place] on Monday at 4:30 to visit Pt.   CSW will begin SNF process.

## 2020-11-04 NOTE — Discharge Planning (Signed)
Ms Caprice Beaver [phone]) from [facility] called for update on pt disposition.

## 2020-11-04 NOTE — ED Notes (Signed)
The pt is sitting up to eat

## 2020-11-04 NOTE — ED Notes (Signed)
Patient sitting upright in bed feeding self.

## 2020-11-04 NOTE — Progress Notes (Addendum)
9:33am-CSW went to speak with pt at bedside ot gather more informationon needs. CSW introduced role and advised pt of CSW's reason for following up with pt. Pt expressed that he cant stay alone and that his wife was his primary care taker. Pt expressed to CSW that wife was killed yesterday in a car accident/ Pt reported that he has children that are active in his care, however pt expressed that "they live all over the place". CSW inquired from pt on what he would like to see occur with his care. Pt expressed that he doesn't really want to go to a SNF but expressed that he is willing to go if needed. Pt reported a desire to be at home but again reported that he can't be alone. CSW asked pt about children and their possible ability to take pt in and care for him and pt expressed that he isn't sure about this as again they stay in different area. CSW very understanding of this and advised pt that PT/OT would lily work with pt first and then CSW could further assist if placement is  needed. Pt expressed that he understood and expressed that his brother would be coming from [facility] today as well as his children are expected to arrived in [place] at some point and time. CSW receive verbal permission to speak with any of pt's children about his care and the needs of pt. CSW understanding and will follow.    CSW acknowledges consult for SNF placement vs HH. CSW will awaiting PT evaluation and then address further needs.    Virgie Dad Kaston Faughn, MSW, LCSW [facility] [place] at [place] [phone]

## 2020-11-05 ENCOUNTER — Other Ambulatory Visit: Payer: Self-pay

## 2020-11-05 LAB — CBC
HCT: 39.7 % (ref 39.0–52.0)
Hemoglobin: 13.4 g/dL (ref 13.0–17.0)
MCH: 31.2 pg (ref 26.0–34.0)
MCHC: 33.8 g/dL (ref 30.0–36.0)
MCV: 92.3 fL (ref 80.0–100.0)
Platelets: 196 10*3/uL (ref 150–400)
RBC: 4.3 MIL/uL (ref 4.22–5.81)
RDW: 14.4 % (ref 11.5–15.5)
WBC: 8 10*3/uL (ref 4.0–10.5)
nRBC: 0 % (ref 0.0–0.2)

## 2020-11-05 LAB — RENAL FUNCTION PANEL
Albumin: 3.1 g/dL — ABNORMAL LOW (ref 3.5–5.0)
Anion gap: 15 (ref 5–15)
BUN: 26 mg/dL — ABNORMAL HIGH (ref 8–23)
CO2: 27 mmol/L (ref 22–32)
Calcium: 9.8 mg/dL (ref 8.9–10.3)
Chloride: 92 mmol/L — ABNORMAL LOW (ref 98–111)
Creatinine, Ser: 6.17 mg/dL — ABNORMAL HIGH (ref 0.61–1.24)
GFR, Estimated: 9 mL/min — ABNORMAL LOW (ref >60)
Glucose, Bld: 178 mg/dL — ABNORMAL HIGH (ref 70–99)
Phosphorus: 4.6 mg/dL (ref 2.5–4.6)
Potassium: 4.1 mmol/L (ref 3.5–5.1)
Sodium: 134 mmol/L — ABNORMAL LOW (ref 135–145)

## 2020-11-05 LAB — CBG MONITORING, ED
Glucose-Capillary: 66 mg/dL — ABNORMAL LOW (ref 70–99)
Glucose-Capillary: 131 mg/dL — ABNORMAL HIGH (ref 70–99)
Glucose-Capillary: 67 mg/dL — ABNORMAL LOW (ref 70–99)

## 2020-11-05 NOTE — ED Notes (Signed)
Per pt request asked provider to order medication to help with having a BM. Pt is not in distress.

## 2020-11-05 NOTE — ED Notes (Signed)
Called to pts room to change his diaper. Pt was slightly wet again. Diaper changed. Bed dry. Pt back to bed. Pt with no complaints.

## 2020-11-05 NOTE — ED Notes (Signed)
phslo not given pharmist in the nursing desk reports that the pt has already had this med earlier today

## 2020-11-05 NOTE — Progress Notes (Signed)
Renal Navigator notes plans for SNF placement. He dialyzes at [facility]: [address], [place] on a MWF schedule with a seat time of 10:25am. He needs to arrive at 10:05am.  Please call or message Renal Navigator with any questions or needs related to patient's HD needs.  Alphonzo Cruise, [place] Renal Navigator [phone]

## 2020-11-05 NOTE — ED Notes (Signed)
Pt cleaned and changed  

## 2020-11-05 NOTE — ED Notes (Signed)
Pt to Dialysis

## 2020-11-05 NOTE — ED Notes (Signed)
Dialysis called, report given. Pt able to go up in 10 minutes to bay 4. Will let charge RN know.

## 2020-11-05 NOTE — Progress Notes (Signed)
CSW reached out to [facility] to confirm bed offer and left voicemail asking for a return call to confirm so process can proceed.  Patient has no other current bed offers.

## 2020-11-05 NOTE — ED Notes (Signed)
Nilda Simmer, niece, [phone] would like an update when available Phineas Semen, wife, [phone]

## 2020-11-05 NOTE — ED Notes (Signed)
Requested SW to update son.

## 2020-11-05 NOTE — ED Notes (Signed)
Called to pts room to change his wet diaper. Pt able to stand while I change the diaper. Bed dry. Pt has no other complaints. Pt back to bed. TV off.

## 2020-11-05 NOTE — ED Notes (Signed)
Pt cbg 66. Pt is A&O and talking with his son at the moment. Will give OJ and recheck

## 2020-11-05 NOTE — ED Notes (Signed)
cbg 131on recheck

## 2020-11-05 NOTE — ED Notes (Signed)
Nilda Simmer, niece, [phone] would like an update when available Phineas Semen, sister, [phone]

## 2020-11-05 NOTE — Clinical Note (Incomplete)
zz

## 2020-11-05 NOTE — ED Provider Notes (Signed)
Emergency Medicine Observation Re-evaluation Note  ARDIE FRACASSO is a 75 y.o. male, seen on rounds today.  Pt initially presented to the ED for complaints of Social Work (Patient's wife passed today in [facility]. EMS was called by PD due to wife passing and her being patient's primary caregiver. ) Currently, the patient is alert, no acute distress, awaiting possible ecf placement   Physical Exam  BP (!) 99/51 (BP Location: Right Arm)   Pulse 66   Temp 97.7 F (36.5 C) (Oral)   Resp 18   SpO2 95%  Physical Exam General: content, nad.  Cardiac: regular rate Lungs: breathing comfortably Psych: alert, content.   ED Course / MDM  EKG:    I have reviewed the labs performed to date as well as medications administered while in observation.  Recent changes in the last 24 hours include continue TOC placement efforts. Patient also for dialysis today.   Plan  Dialysis. TOC placement to ecf.    Lajean Saver, MD 11/05/20 1226

## 2020-11-05 NOTE — Progress Notes (Addendum)
CSW received return call from [facility] and confirmed bed offer with them. They will begin auth, however they note [facility] does not review auth's over the weekend.   1/22: CSW received return call from [facility] that they do not have long-term at this time and will rescind the bed offer. TOC will continue to seek placement.

## 2020-11-06 LAB — RENAL FUNCTION PANEL
Albumin: 2.8 g/dL — ABNORMAL LOW (ref 3.5–5.0)
Anion gap: 13 (ref 5–15)
BUN: 30 mg/dL — ABNORMAL HIGH (ref 8–23)
CO2: 29 mmol/L (ref 22–32)
Calcium: 10 mg/dL (ref 8.9–10.3)
Chloride: 94 mmol/L — ABNORMAL LOW (ref 98–111)
Creatinine, Ser: 6.64 mg/dL — ABNORMAL HIGH (ref 0.61–1.24)
GFR, Estimated: 8 mL/min — ABNORMAL LOW (ref >60)
Glucose, Bld: 179 mg/dL — ABNORMAL HIGH (ref 70–99)
Phosphorus: 5.2 mg/dL — ABNORMAL HIGH (ref 2.5–4.6)
Potassium: 4 mmol/L (ref 3.5–5.1)
Sodium: 136 mmol/L (ref 135–145)

## 2020-11-06 LAB — CBG MONITORING, ED: Glucose-Capillary: 107 mg/dL — ABNORMAL HIGH (ref 70–99)

## 2020-11-06 LAB — HEPATITIS B SURFACE ANTIGEN: Hepatitis B Surface Ag: NONREACTIVE

## 2020-11-06 MED ORDER — DOCUSATE SODIUM 100 MG PO CAPS
100.0000 mg | ORAL_CAPSULE | Freq: Every day | ORAL | Status: AC
  Administered 2020-11-06 – 2020-11-10 (×5): 100 mg via ORAL
  Filled 2020-11-06 (×5): qty 1

## 2020-11-06 NOTE — Progress Notes (Signed)
[facility] Renal Quick Note  Aware that Mr. Paulovich remains in the ED awaiting SNF placement s/p death of his wife who was his caretaker.  Reviewed most recent vitals and labs in Epic. BP is controlled, and he is without an oxygen requirement.  Blood pressure 123/85, pulse 99, temperature 97.8 F (36.6 C), temperature source Oral, resp. rate 18, SpO2 96 %.  He was last dialyzed on Saturday 1/22, but is usual MWF schedule.  He will next be due for dialysis on 1/24 - orders have been placed.  Veneta Penton, [name] [employer] Pager [phone]

## 2020-11-06 NOTE — ED Notes (Signed)
Pt ate 100% of breakfast.

## 2020-11-06 NOTE — ED Notes (Signed)
Breakfast order placed ?

## 2020-11-06 NOTE — ED Provider Notes (Signed)
Emergency Medicine Observation Re-evaluation Note  Ernest [name] is a 75 y.o. male, seen on rounds today.  Pt initially presented to the ED for complaints of Social Work (Patient's wife passed today in [facility]. EMS was called by PD due to wife passing and her being patient's primary caregiver. ) Currently, the patient is eating breakfast   Physical Exam  BP 123/85 (BP Location: Right Arm)   Pulse 99   Temp 97.8 F (36.6 C) (Oral)   Resp 18   SpO2 96%  Physical Exam General: Alert, awake, resting comfortably Cardiac: Regular rate Lungs: Respirations unlabored Psych: calm, cooperative  ED Course / MDM  EKG:    I have reviewed the labs performed to date as well as medications administered while in observation.  Recent changes in the last 24 hours included bed offer at [facility]- do not have longer tem bed at this time therefore bed offer was rescinded, continuing to seek placement.   Plan  Current plan is for SNF placement. Renal navigator note with dialysis details.     Amaryllis Dyke, [name] 11/06/20 [place], [place], DO 11/06/20 1454

## 2020-11-07 LAB — RENAL FUNCTION PANEL
Albumin: 2.9 g/dL — ABNORMAL LOW (ref 3.5–5.0)
Anion gap: 16 — ABNORMAL HIGH (ref 5–15)
BUN: 41 mg/dL — ABNORMAL HIGH (ref 8–23)
CO2: 24 mmol/L (ref 22–32)
Calcium: 10.1 mg/dL (ref 8.9–10.3)
Chloride: 94 mmol/L — ABNORMAL LOW (ref 98–111)
Creatinine, Ser: 7.98 mg/dL — ABNORMAL HIGH (ref 0.61–1.24)
GFR, Estimated: 7 mL/min — ABNORMAL LOW (ref >60)
Glucose, Bld: 122 mg/dL — ABNORMAL HIGH (ref 70–99)
Phosphorus: 5.5 mg/dL — ABNORMAL HIGH (ref 2.5–4.6)
Potassium: 4.3 mmol/L (ref 3.5–5.1)
Sodium: 134 mmol/L — ABNORMAL LOW (ref 135–145)

## 2020-11-07 LAB — CBC
HCT: 36.9 % — ABNORMAL LOW (ref 39.0–52.0)
Hemoglobin: 12.2 g/dL — ABNORMAL LOW (ref 13.0–17.0)
MCH: 30.7 pg (ref 26.0–34.0)
MCHC: 33.1 g/dL (ref 30.0–36.0)
MCV: 92.9 fL (ref 80.0–100.0)
Platelets: 193 10*3/uL (ref 150–400)
RBC: 3.97 MIL/uL — ABNORMAL LOW (ref 4.22–5.81)
RDW: 14 % (ref 11.5–15.5)
WBC: 9.1 10*3/uL (ref 4.0–10.5)
nRBC: 0 % (ref 0.0–0.2)

## 2020-11-07 LAB — CBG MONITORING, ED
Glucose-Capillary: 60 mg/dL — ABNORMAL LOW (ref 70–99)
Glucose-Capillary: 119 mg/dL — ABNORMAL HIGH (ref 70–99)
Glucose-Capillary: 189 mg/dL — ABNORMAL HIGH (ref 70–99)

## 2020-11-07 LAB — SARS CORONAVIRUS 2 BY RT PCR (HOSPITAL ORDER, PERFORMED IN ~~LOC~~ HOSPITAL LAB): SARS Coronavirus 2: NEGATIVE

## 2020-11-07 MED ORDER — POLYETHYLENE GLYCOL 3350 17 G PO PACK
17.0000 g | PACK | Freq: Every day | ORAL | Status: DC
  Administered 2020-11-07 – 2020-11-18 (×9): 17 g via ORAL
  Filled 2020-11-07 (×9): qty 1

## 2020-11-07 MED ORDER — HEPARIN SODIUM (PORCINE) 1000 UNIT/ML DIALYSIS
2000.0000 [IU] | Freq: Once | INTRAMUSCULAR | Status: DC
  Filled 2020-11-07: qty 2

## 2020-11-07 MED ORDER — CALCIUM ACETATE (PHOS BINDER) 667 MG PO CAPS
667.0000 mg | ORAL_CAPSULE | Freq: Three times a day (TID) | ORAL | Status: DC
Start: 2020-11-07 — End: 2020-11-19
  Administered 2020-11-07 – 2020-11-18 (×23): 667 mg via ORAL
  Filled 2020-11-07 (×23): qty 1

## 2020-11-07 NOTE — Progress Notes (Signed)
Physical Therapy Treatment Patient Details Name: Ernest [name] MRN: [id number] DOB: 02-10-1946 Today's Date: 11/07/2020    History of Present Illness Pt is a 75 y/o male presenting to the hospital for SNF placement as his wife who was his primary caregiver was killed in an MVC. PMH includes HTN, CVA with L sided deficits, ESRD on HD, COPD, CAD, DVT, and Hep C.    PT Comments    Pt progressing towards goals. Pt reporting increased weakness in legs and demonstrated instability when transferring to chair. Required min A with use of RW. HEP performed during session. Current recommendations appropriate. Will continue to follow acutely.    Follow Up Recommendations  SNF;Supervision/Assistance - 24 hour     Equipment Recommendations  None recommended by PT    Recommendations for Other Services       Precautions / Restrictions Precautions Precautions: Fall Restrictions Weight Bearing Restrictions: No    Mobility  Bed Mobility Overal bed mobility: Needs Assistance Bed Mobility: Supine to Sit     Supine to sit: Supervision     General bed mobility comments: Supervision for safety.  Transfers Overall transfer level: Needs assistance Equipment used: Rolling walker (2 wheeled) Transfers: Sit to/from [facility] Sit to Stand: Min assist Stand pivot transfers: Min assist       General transfer comment: Min A for lift assist and steadying to stand and transfer to chair. Pt reporting he did not want to walk right now and that he would walk later.  Ambulation/Gait                 Stairs             Wheelchair Mobility    Modified Rankin (Stroke Patients Only)       Balance Overall balance assessment: Needs assistance Sitting-balance support: No upper extremity supported Sitting balance-Leahy Scale: Fair     Standing balance support: Bilateral upper extremity supported Standing balance-Leahy Scale: Poor Standing balance comment:  Reliant on UE and external support                            Cognition Arousal/Alertness: Awake/alert Behavior During Therapy: WFL for tasks assessed/performed Overall Cognitive Status: No family/caregiver present to determine baseline cognitive functioning                                 General Comments: Likely close to his baseline. Required increased time to respond to questions.      Exercises General Exercises - Upper Extremity Shoulder Flexion: AROM;Both;10 reps;Seated;Limitations Shoulder Flexion Limitations: Limited ROM bilaterally (RUE>LUE) General Exercises - Lower Extremity Long Arc Quad: AROM;Both;10 reps;Seated Hip Flexion/Marching: AROM;Both;10 reps;Seated    General Comments        Pertinent Vitals/Pain Pain Assessment: No/denies pain    Home Living                      Prior Function            PT Goals (current goals can now be found in the care plan section) Acute Rehab PT Goals Patient Stated Goal: none stated PT Goal Formulation: With patient Time For Goal Achievement: 11/18/20 Potential to Achieve Goals: Good Progress towards PT goals: Progressing toward goals    Frequency    Min 2X/week      PT Plan Current plan remains appropriate  Co-evaluation              AM-PAC PT "6 Clicks" Mobility   Outcome Measure  Help needed turning from your back to your side while in a flat bed without using bedrails?: A Little Help needed moving from lying on your back to sitting on the side of a flat bed without using bedrails?: A Little Help needed moving to and from a bed to a chair (including a wheelchair)?: A Little Help needed standing up from a chair using your arms (e.g., wheelchair or bedside chair)?: A Little Help needed to walk in hospital room?: A Little Help needed climbing 3-5 steps with a railing? : A Lot 6 Click Score: 17    End of Session Equipment Utilized During Treatment: Gait  belt Activity Tolerance: Patient tolerated treatment well Patient left: with call bell/phone within reach;in chair (in recliner in ED) Nurse Communication: Mobility status PT Visit Diagnosis: Unsteadiness on feet (R26.81);Muscle weakness (generalized) (M62.81);Difficulty in walking, not elsewhere classified (R26.2)     Time: [id number] PT Time Calculation (min) (ACUTE ONLY): 12 min  Charges:  $Therapeutic Activity: 8-22 mins                     Lou Miner, DPT  Acute Rehabilitation Services  Pager: [phone] Office: [phone]    Rudean Hitt 11/07/2020, 10:45 AM

## 2020-11-07 NOTE — ED Notes (Signed)
Pt given breakfast at this time.

## 2020-11-07 NOTE — ED Notes (Signed)
Pt eating lunch at this time

## 2020-11-07 NOTE — Progress Notes (Signed)
  [facility] Progress Note   Subjective:   Pt reports feeling well today, no new concerns. Denies SOB, CP, palpitations, dizziness, abdominal pain, and nausea.  Objective Vitals:   11/06/20 1700 11/06/20 2222 11/07/20 0634 11/07/20 0912  BP: 120/80 (!) 167/74 (!) 88/49 (!) 143/54  Pulse: 74 81 71   Resp: '18 18 18   '$ Temp: 98.3 F (36.8 C) 98.3 F (36.8 C) 98.1 F (36.7 C)   TempSrc:  Oral Oral   SpO2: 97% 93% 98%    Physical Exam General: Well developed, alert male in NAD Heart: RRR, no murmurs, rubs or gallops Lungs: CTA bilaterally, no wheezing, rhonchi or rales Abdomen: Soft, non-tender, non-distended, +BS Extremities: No edema b/l lower extremities Dialysis Access:  AVG + bruit  Additional Objective Labs: Basic Metabolic Panel: Recent Labs  Lab 11/05/20 2018 11/06/20 0413  NA 134* 136  K 4.1 4.0  CL 92* 94*  CO2 27 29  GLUCOSE 178* 179*  BUN 26* 30*  CREATININE 6.17* 6.64*  CALCIUM 9.8 10.0  PHOS 4.6 5.2*   Liver Function Tests: Recent Labs  Lab 11/05/20 2018 11/06/20 0413  ALBUMIN 3.1* 2.8*   CBC: Recent Labs  Lab 11/05/20 2018  WBC 8.0  HGB 13.4  HCT 39.7  MCV 92.3  PLT 196   Blood Culture    Component Value Date/Time   SDES PLEURAL LEFT 09/30/2019 1122   SDES PLEURAL LEFT 09/30/2019 1122   SPECREQUEST NONE 09/30/2019 1122   SPECREQUEST NONE 09/30/2019 1122   CULT  09/30/2019 1122    NO GROWTH 5 DAYS Performed at [facility], [place] [address] [place], [place] [address]    REPTSTATUS 09/30/2019 FINAL 09/30/2019 1122   REPTSTATUS 10/05/2019 FINAL 09/30/2019 1122    CBG: Recent Labs  Lab 11/05/20 1444 11/05/20 1802 11/06/20 0755 11/07/20 0800 11/07/20 0919  GLUCAP 131* 67* 107* 60* 119*   Medications: . sodium chloride     . allopurinol  100 mg Oral Daily  . amiodarone  200 mg Oral Daily  . aspirin EC  81 mg Oral Daily  . atorvastatin  40 mg Oral Daily  . calcium acetate  1,334 mg Oral TID WC  .  Chlorhexidine Gluconate Cloth  6 each Topical Q0600  . cinacalcet  60 mg Oral Q breakfast  . cyproheptadine  2 mg Oral TID WC  . docusate sodium  100 mg Oral Daily  . insulin detemir  20 Units Subcutaneous Daily  . levETIRAcetam  500 mg Oral 2 times per day on Sun Tue Thu Sat  . levETIRAcetam  500 mg Oral 3 times per day on Mon Wed Fri  . mirtazapine  7.5 mg Oral QHS  . sertraline  25 mg Oral QHS    Assessment/Plan: 1. Needs SNF: Patient's wife, who was his care taker, passed away. Currently boarding in ED until SNF placement is obtained.  2. ESRD: Dialyzes on MWF schedule, planned for HD today. No signs or symptoms of uremia or volume overload. 3. HTN/volume: BP variable, hypotension reported overnight and BP now elevated. No SOB or edema. UF with HD today as tolerated.    4. Anemia: Hgb 13.4. No ESA indicated.  5. Secondary hyperparathyroidism:  Corrected calcium high. Phos 5.2. Continue sensipar and low Ca bath, will decrease calcium acetate binder slightly and follow phos. May need a different binder.    Anice Paganini, [name] 11/07/2020, 9:44 AM  [facility] Pager: [phone]

## 2020-11-07 NOTE — ED Notes (Signed)
Pt assited to bed from recliner.

## 2020-11-07 NOTE — ED Notes (Signed)
Dialysis called to say they would be ready for this pt in a half hr or so.

## 2020-11-07 NOTE — ED Notes (Signed)
Breakfast Order Placed ?

## 2020-11-07 NOTE — ED Notes (Signed)
Patient spoke with family on the phone-Monique,RN

## 2020-11-07 NOTE — ED Notes (Signed)
Pt to dialysis.  Should return to room 50 when complete.

## 2020-11-07 NOTE — Progress Notes (Signed)
Pt now has PASRR # [id number]

## 2020-11-07 NOTE — ED Notes (Signed)
Pt voices concerns that his wife wants a funeral and he wants to be sure the family is aware. RN called daughter to allow pt to speak with her regarding plans for funeral. Pt is satisfied that family is aware.

## 2020-11-07 NOTE — ED Provider Notes (Signed)
Emergency Medicine Observation Re-evaluation Note  Ernest [name] is a 75 y.o. male, seen on rounds today.  Pt initially presented to the ED for complaints of Social Work (Patient's wife passed today in [facility]. EMS was called by PD due to wife passing and her being patient's primary caregiver. )   Physical Exam  BP (!) 101/55 (BP Location: Right Arm)   Pulse 79   Temp 98.3 F (36.8 C) (Oral)   Resp 16   Wt 86 kg   SpO2 94%   BMI 28.00 kg/m  Physical Exam General: NAD, sitting at bedside eating meal Cardiac: reg rhythm Lungs: no distress or increased work of breathing Psych: calm  ED Course / MDM  EKG:    I have reviewed the labs performed to date as well as medications administered while in observation.  Recent changes in the last 24 hours include c/o constipation, taking colace. Will order prn miralax.   Plan  Current plan is for SNF. Patient is not under full IVC at this time.   Carlisle Cater, [name] 11/07/20 Diamantina Monks, MD 11/07/20 2257

## 2020-11-08 LAB — CBG MONITORING, ED: Glucose-Capillary: 123 mg/dL — ABNORMAL HIGH (ref 70–99)

## 2020-11-08 MED ORDER — CHLORHEXIDINE GLUCONATE CLOTH 2 % EX PADS: 6.0000 | MEDICATED_PAD | Freq: Every day | CUTANEOUS | Status: DC

## 2020-11-08 NOTE — Progress Notes (Signed)
[facility] Progress Note   Subjective:   Pt seen in ED. Alert, pleasant, eating lunch. C/o constipation. No abdominal pain or nausea. Denies SOB, CP, palpitations, dizziness.   Objective Vitals:   11/07/20 1730 11/07/20 2206 11/08/20 0600 11/08/20 1052  BP: (!) 101/55 (!) 97/59 126/63 90/68  Pulse: 79 94 79 76  Resp: '16 18 18 18  '$ Temp: 98.3 F (36.8 C) 98.9 F (37.2 C) 99.5 F (37.5 C) 97.8 F (36.6 C)  TempSrc: Oral Oral Oral Oral  SpO2: 94% 95% 96% 95%  Weight: 86 kg      Physical Exam General: Well developed, alert male in NAD Heart: RRR, no murmurs, rubs or gallops Lungs: CTA bilaterally, no wheezing, rhonchi or rales Abdomen: Soft, non-tender, non-distended, +BS Extremities: No edema b/l lower extremities Dialysis Access:  AVG + bruit  Additional Objective Labs: Basic Metabolic Panel: Recent Labs  Lab 11/05/20 2018 11/06/20 0413 11/07/20 0920  NA 134* 136 134*  K 4.1 4.0 4.3  CL 92* 94* 94*  CO2 '27 29 24  '$ GLUCOSE 178* 179* 122*  BUN 26* 30* 41*  CREATININE 6.17* 6.64* 7.98*  CALCIUM 9.8 10.0 10.1  PHOS 4.6 5.2* 5.5*   Liver Function Tests: Recent Labs  Lab 11/05/20 2018 11/06/20 0413 11/07/20 0920  ALBUMIN 3.1* 2.8* 2.9*   No results for input(s): LIPASE, AMYLASE in the last 168 hours. CBC: Recent Labs  Lab 11/05/20 2018 11/07/20 0920  WBC 8.0 9.1  HGB 13.4 12.2*  HCT 39.7 36.9*  MCV 92.3 92.9  PLT 196 193   Blood Culture    Component Value Date/Time   SDES PLEURAL LEFT 09/30/2019 1122   SDES PLEURAL LEFT 09/30/2019 1122   SPECREQUEST NONE 09/30/2019 1122   SPECREQUEST NONE 09/30/2019 1122   CULT  09/30/2019 1122    NO GROWTH 5 DAYS Performed at [facility], [place] [address], [place], [place] [address]    REPTSTATUS 09/30/2019 FINAL 09/30/2019 1122   REPTSTATUS 10/05/2019 FINAL 09/30/2019 1122   CBG: Recent Labs  Lab 11/06/20 0755 11/07/20 0800 11/07/20 0919 11/07/20 1957 11/08/20 0802  GLUCAP 107* 60*  119* 189* 123*   Medications: . sodium chloride     . allopurinol  100 mg Oral Daily  . amiodarone  200 mg Oral Daily  . aspirin EC  81 mg Oral Daily  . atorvastatin  40 mg Oral Daily  . calcium acetate  667 mg Oral TID WC  . Chlorhexidine Gluconate Cloth  6 each Topical Q0600  . cinacalcet  60 mg Oral Q breakfast  . cyproheptadine  2 mg Oral TID WC  . docusate sodium  100 mg Oral Daily  . heparin  2,000 Units Dialysis Once in dialysis  . insulin detemir  20 Units Subcutaneous Daily  . levETIRAcetam  500 mg Oral 2 times per day on Sun Tue Thu Sat  . levETIRAcetam  500 mg Oral 3 times per day on Mon Wed Fri  . mirtazapine  7.5 mg Oral QHS  . polyethylene glycol  17 g Oral Daily  . sertraline  25 mg Oral QHS     Assessment/Plan: 1. Needs SNF: Patient's wife, who was his care taker, passed away. Currently boarding in ED until SNF placement is obtained.  2. ESRD: Dialyzes on MWF schedule, next HD tomorrow.. No signs or symptoms of uremia or volume overload. Will be abbreviated treatment due to high patient census/staffing shortage.  3. HTN/volume: BP variable, overall controlled. No SOB or edema.  UF with HD today as tolerated.    4. Anemia: Hgb 13.4. No ESA indicated.  5. Secondary hyperparathyroidism:  Corrected calcium high. Phos 5.2. Continue sensipar and low Ca bath, will decrease calcium acetate binder slightly and follow phos. May need a different binder.   Anice Paganini, [name] 11/08/2020, 2:49 PM  [facility] Pager: [phone]

## 2020-11-08 NOTE — Social Work (Signed)
CSW spoke with Dr. Ashok Cordia to request that Pt be allowed to leave ED for a short time on Thursday 11/08/20 to attend the funeral of Pt's wife. Pt will be transported to and from funeral by family.  CSW spoke with son, Demetrick @ [phone] and son verbalized understanding that Pt care will be solely the responsibility of the family while in their custody. CSW updated ED Director.

## 2020-11-08 NOTE — Progress Notes (Signed)
CSW reached out to [facility] and [facility] who already have referrals pending for placement.  CSW faxed out referrals to all [facility] facilities in an effort to expand options for placement. CSW attempted to call daughter Ernest [name] via phone @ [phone] to discuss case. No answer, no option for voicemail.

## 2020-11-08 NOTE — Progress Notes (Addendum)
CSW connected daughter heather with [place] [facility] [employer] [facility] and assisted in assuring that financial disclosure paperwork was signed and received in order to begin Medicaid application process.  CSW faxed out referrals to several more SNFs

## 2020-11-08 NOTE — ED Provider Notes (Signed)
Emergency Medicine Observation Re-evaluation Note  Ernest [name] is a 75 y.o. male, seen on rounds today.  Pt initially presented to the ED for complaints of Social Work (Patient's wife passed today in [facility]. EMS was called by PD due to wife passing and her being patient's primary caregiver. ) Currently, the patient is awaiting SNF placement. Patient also with hx esrd and is getting normal tri-weekly dialysis.   Physical Exam  BP 126/63 (BP Location: Right Arm)   Pulse 79   Temp 99.5 F (37.5 C) (Oral)   Resp 18   Wt 86 kg   SpO2 96%   BMI 28.00 kg/m  Physical Exam General: resting Cardiac: normal rate Lungs: breathing comfortably Psych: resting, calm.   ED Course / MDM  EKG:    I have reviewed the labs performed to date as well as medications administered while in observation.  Recent changes in the last 24 hours include TOC re-evaluation and placement efforts.   Plan  Current plan is TOC placement, and continued dialysis while boarding in ED.    Lajean Saver, MD 11/08/20 [phone]

## 2020-11-09 LAB — CBC
HCT: 34.4 % — ABNORMAL LOW (ref 39.0–52.0)
Hemoglobin: 11.9 g/dL — ABNORMAL LOW (ref 13.0–17.0)
MCH: 31.5 pg (ref 26.0–34.0)
MCHC: 34.6 g/dL (ref 30.0–36.0)
MCV: 91 fL (ref 80.0–100.0)
Platelets: 203 10*3/uL (ref 150–400)
RBC: 3.78 MIL/uL — ABNORMAL LOW (ref 4.22–5.81)
RDW: 14.2 % (ref 11.5–15.5)
WBC: 8.4 10*3/uL (ref 4.0–10.5)
nRBC: 0 % (ref 0.0–0.2)

## 2020-11-09 LAB — RENAL FUNCTION PANEL
Albumin: 3 g/dL — ABNORMAL LOW (ref 3.5–5.0)
Anion gap: 15 (ref 5–15)
BUN: 43 mg/dL — ABNORMAL HIGH (ref 8–23)
CO2: 25 mmol/L (ref 22–32)
Calcium: 9.4 mg/dL (ref 8.9–10.3)
Chloride: 95 mmol/L — ABNORMAL LOW (ref 98–111)
Creatinine, Ser: 8.11 mg/dL — ABNORMAL HIGH (ref 0.61–1.24)
GFR, Estimated: 6 mL/min — ABNORMAL LOW (ref >60)
Glucose, Bld: 73 mg/dL (ref 70–99)
Phosphorus: 5.8 mg/dL — ABNORMAL HIGH (ref 2.5–4.6)
Potassium: 4.4 mmol/L (ref 3.5–5.1)
Sodium: 135 mmol/L (ref 135–145)

## 2020-11-09 LAB — CBG MONITORING, ED: Glucose-Capillary: 94 mg/dL (ref 70–99)

## 2020-11-09 MED ORDER — HEPARIN SODIUM (PORCINE) 1000 UNIT/ML DIALYSIS
1000.0000 [IU] | INTRAMUSCULAR | Status: DC | PRN
  Filled 2020-11-09: qty 1

## 2020-11-09 MED ORDER — SODIUM CHLORIDE 0.9 % IV SOLN: 100.0000 mL | INTRAVENOUS | Status: DC | PRN

## 2020-11-09 MED ORDER — LIDOCAINE HCL (PF) 1 % IJ SOLN: 5.0000 mL | INTRAMUSCULAR | Status: DC | PRN

## 2020-11-09 MED ORDER — LIDOCAINE-PRILOCAINE 2.5-2.5 % EX CREA
1.0000 "application " | TOPICAL_CREAM | CUTANEOUS | Status: DC | PRN
  Filled 2020-11-09: qty 5

## 2020-11-09 MED ORDER — HEPARIN SODIUM (PORCINE) 1000 UNIT/ML DIALYSIS
4000.0000 [IU] | INTRAMUSCULAR | Status: DC | PRN
  Filled 2020-11-09: qty 4

## 2020-11-09 MED ORDER — PENTAFLUOROPROP-TETRAFLUOROETH EX AERO
1.0000 "application " | INHALATION_SPRAY | CUTANEOUS | Status: DC | PRN
  Filled 2020-11-09: qty 116

## 2020-11-09 MED ORDER — ALTEPLASE 2 MG IJ SOLR: 2.0000 mg | Freq: Once | INTRAMUSCULAR | Status: DC | PRN

## 2020-11-09 NOTE — ED Notes (Addendum)
[name] [phone] (daughter)

## 2020-11-09 NOTE — ED Notes (Signed)
Report given to dialysis.   

## 2020-11-09 NOTE — ED Notes (Signed)
The pts son remains at the bedside he wants to sign papers being responsible for the pts bills etc  Case manager will soon be out to talk to the pt the son remains at  The bedside

## 2020-11-09 NOTE — ED Notes (Signed)
TO Dialysis -- pt ate 100% breakfast--

## 2020-11-09 NOTE — Progress Notes (Signed)
[name] Progress Note   Subjective:   Pt seen in room after HD. Reports feeling well. No abdominal pain or nausea. Denies SOB, CP, palpitations, dizziness. RN reports he will be leaving briefly for a funeral tomorrow then returning to the ED.   Objective Vitals:   11/09/20 1145 11/09/20 1215 11/09/20 1220 11/09/20 1313  BP: (!) 94/52 (!) 98/50 (!) 95/52 (!) 107/54  Pulse: 76 75 70 79  Resp:   18 18  Temp:   97.8 F (36.6 C)   TempSrc:   Oral   SpO2:   97% 96%  Weight:   83 kg   Height:       Physical Exam General:Well developed, alert male in NAD Heart:RRR, no murmurs, rubs or gallops Lungs:CTA bilaterally, no wheezing, rhonchi or rales Abdomen:Soft, non-tender, non-distended, +BS Extremities:No edema b/l lower extremities Dialysis Access:AVG + bruit  Additional Objective Labs: Basic Metabolic Panel: Recent Labs  Lab 11/06/20 0413 11/07/20 0920 11/09/20 0957  NA 136 134* 135  K 4.0 4.3 4.4  CL 94* 94* 95*  CO2 '29 24 25  '$ GLUCOSE 179* 122* 73  BUN 30* 41* 43*  CREATININE 6.64* 7.98* 8.11*  CALCIUM 10.0 10.1 9.4  PHOS 5.2* 5.5* 5.8*   Liver Function Tests: Recent Labs  Lab 11/06/20 0413 11/07/20 0920 11/09/20 0957  ALBUMIN 2.8* 2.9* 3.0*   CBC: Recent Labs  Lab 11/05/20 2018 11/07/20 0920 11/09/20 0957  WBC 8.0 9.1 8.4  HGB 13.4 12.2* 11.9*  HCT 39.7 36.9* 34.4*  MCV 92.3 92.9 91.0  PLT 196 193 203   Blood Culture    Component Value Date/Time   SDES PLEURAL LEFT 09/30/2019 1122   SDES PLEURAL LEFT 09/30/2019 1122   SPECREQUEST NONE 09/30/2019 1122   SPECREQUEST NONE 09/30/2019 1122   CULT  09/30/2019 1122    NO GROWTH 5 DAYS Performed at [facility], [place] [address], [place], [place] [address]    REPTSTATUS 09/30/2019 FINAL 09/30/2019 1122   REPTSTATUS 10/05/2019 FINAL 09/30/2019 1122    CBG: Recent Labs  Lab 11/07/20 0800 11/07/20 0919 11/07/20 1957 11/08/20 0802 11/09/20 0910  GLUCAP 60* 119* 189*  123* 94   Medications: . sodium chloride    . sodium chloride    . sodium chloride     . allopurinol  100 mg Oral Daily  . amiodarone  200 mg Oral Daily  . aspirin EC  81 mg Oral Daily  . atorvastatin  40 mg Oral Daily  . calcium acetate  667 mg Oral TID WC  . Chlorhexidine Gluconate Cloth  6 each Topical Q0600  . Chlorhexidine Gluconate Cloth  6 each Topical Q0600  . cinacalcet  60 mg Oral Q breakfast  . cyproheptadine  2 mg Oral TID WC  . docusate sodium  100 mg Oral Daily  . heparin  2,000 Units Dialysis Once in dialysis  . insulin detemir  20 Units Subcutaneous Daily  . levETIRAcetam  500 mg Oral 2 times per day on Sun Tue Thu Sat  . levETIRAcetam  500 mg Oral 3 times per day on Mon Wed Fri  . mirtazapine  7.5 mg Oral QHS  . polyethylene glycol  17 g Oral Daily  . sertraline  25 mg Oral QHS   Assessment/Plan: 1.Needs SNF: Patient's wife, who was his care taker, passed away. Currently boarding in ED until SNF placement is obtained. 2. ESRD:Dialyzes on MWF schedule. No signs or symptoms of uremia or volume overload.  3.HTN/volume:BP variable,  overall controlled, soft after HD. No SOB or edema. 1kg below his outpatient EDW of 84kg.  4. Anemia:Hgb 13.4. No ESA indicated. 5. Secondary hyperparathyroidism:Corrected calcium high but improving. Phos 5.8. Continue sensipar and low Ca bath. Calcium acetate binder decreased slightly and phos is trending back up. May need a different binder.  Anice Paganini, [name] 11/09/2020, 2:36 PM  [facility] Pager: [phone]

## 2020-11-09 NOTE — ED Notes (Signed)
Lunch Tray Ordered @ 1032. 

## 2020-11-09 NOTE — ED Notes (Signed)
The pt wants to use the bedside commode but he has family coming back to see him and he wants to wait until after their visit

## 2020-11-09 NOTE — ED Notes (Signed)
Returned from dialysis

## 2020-11-09 NOTE — Progress Notes (Addendum)
11:21am-CSW spoke with Narda Rutherford from [facility] and was advised they are able to offer bed (next week)  to pt as long as insurance [facility]) gives them [place]. It has been asked by [facility] that pt gets rehab still. Facility is also asking that pt have an updated COVID test before admitting to the facility as well.   CSW advised that Janie from [facility] to follow up with CSW on Monday and potentially began authorization at that time.   10:21am-CSW received call back from [place] with [facility]. CSW advised Su Hilt that pt and family are now agreeable to placement. CSW was asked about Medicaid in which CSW advised Jacqulynn Cadet that per [facility] and pt's son they are working on medicaid at this time however pt doesn't have Medicaid. Jacqulynn Cadet expressed that they have no Medicaid beds at this time-therefore  are not able to offer on pt.  CSW understanding and advised her that pt wouldn't be coming to facility on medicaid as again pt doesn't have Medicaid. Rollene Fare expressed [facility] is no longer able to offer bed on pt. CSW to continue search for placement and update family when new lead is confirmed.   10:13am-CSW received call from [place] with [facility] advising CSW that she is also willing to look at pt, however Narda Rutherford requested that [place] send over proof of COVID and a Medicaid ID number. CSW has sent over COVID vaccine proof however aware that pt doesn't have Medicaid at this time. CSW reached out to [employer] [facility] to see status of Medicaid application-left voicemail at this time.   9:50am- CSW spoke with pt's son and was advised that they are still wanting ppt to be placed. CSW was advised that daughter [name] has filled out Medicaid application to further assisting getting pt placed. CSW was advised that per son they are wanting pt to be placed, get Medicaid and then possibility change pt to a facility that is closer to [place]. Son expressed that he is wanting pt to be  closer when able to. CSW expressed understanding and advised son that CSW had been in contact with [facility] about placement options. CSW advised son that CSW would update [facility] of desire to pursue placement at this facility. CSW advised son that per [facility], they wouldn't be able to admit pt until tomorrow however. Son expressed that pt's wife funeral is tomorrow and then they have plans to return npt back to the hospital to finish placement needs. CSW understanding and has followed back up with [facility]-leftvoicemail. CSW awaiting call back.  CSW still following for placement needs at this time. CSW spoke with pt at bedside  try and confirm further needs. CSW was advised that per pt, his daughter [name] has come into town and is working on Cabin crew for pt's wife. Per pt, he is wanting son Social research officer, government) to make decisions for his care, however pt also reported that he doesn't mind if daughter [name] is involved. CSW noted that pt expressed the desire to be home but pt is very aware that he is unable to care for self alone. Pt expressed that he would like for CSW to speak with son and have son and daughter collectively make decision on needs.  CSW noted that pt has bed offer from [facility]. CSW spoke with Kuristin from [facility] and was advised that bed offer is official however they wouldn't be able ot admit pt til tomorrow. CSW advised [facility] that pt is on Dialysis and that pt gets  to dialysis via [facility]. [facility] expressed that they may be able to address this, however CSW advised [facility] that CSW would speak with family about options. CSW to call son and proceed from there.    Virgie Dad Reakwon Barren, MSW, LCSW [facility] [phone]

## 2020-11-09 NOTE — ED Provider Notes (Signed)
Emergency Medicine Observation Re-evaluation Note  Ernest [name] is a 75 y.o. male, seen on rounds today.  Pt initially presented to the ED for complaints of Social Work (Patient's wife passed today in [facility]. EMS was called by PD due to wife passing and her being patient's primary caregiver. ) Currently, the patient is in dialysis.  Physical Exam  BP (!) 86/46 (BP Location: Right Arm)   Pulse 83   Temp 98 F (36.7 C) (Oral)   Resp 18   Ht '5\' 9"'$  (1.753 m)   Wt 84.2 kg   SpO2 100%   BMI 27.41 kg/m  Physical Exam General: pt in dialysis and no present for exam Cardiac:  Lungs:  Psych:   ED Course / MDM  EKG:    I have reviewed the labs performed to date as well as medications administered while in observation.  Recent changes in the last 24 hours include no events overnight.  Plan  Current plan is for pt still looking for placement.  Due to not being medicaid and insurance issues pt does not have any offers at this time.  Not able to care for himself.  SW following pt and pt's son and daughter are making the decisions.    Blanchie Dessert, MD 11/09/20 [phone]

## 2020-11-09 NOTE — Progress Notes (Signed)
CSW met with Pt at bedside.  Pt is A&Ox4 and states that he would like to have his son, Ernest [name], [place] put in charge of his financial affairs. This is a verbatim repeat of the discussion had with Pt earlier at bedside and is consistent with all interactions with Pt up to this time.  Pt specifically stated that daughter, Ernest [name], should be listed as second in charge of finances.  CSW printed off Power of Attorney paperwork and explained what paperwork was. Pt again stated he wished for son, Ernest [name] to be placed in charge of all of his assets.   [facility] Secretary and Notary [name] was present to witness these statements.  CSW assisted Pt with the physical process of signing paperwork due to physical limitations. Ms. Mare Ferrari notarized paperwork. Copies were given to son as well as stickered and added to Pt file.

## 2020-11-10 MED ORDER — CHLORHEXIDINE GLUCONATE CLOTH 2 % EX PADS: 6.0000 | MEDICATED_PAD | Freq: Every day | CUTANEOUS | Status: DC

## 2020-11-10 NOTE — Progress Notes (Signed)
CSW met with family after they returned with Pt from funeral to have [place] sign Medicaid application paperwork.  Paperwork emailed to [employer].

## 2020-11-10 NOTE — ED Notes (Signed)
Patient is back from his wifes funeral .undress patient .assit patient back to bed to sit on side took patient pants off.assit legs in bed,family at bedside.

## 2020-11-10 NOTE — ED Notes (Signed)
Patient picked up by son to travel to wife's funeral.

## 2020-11-10 NOTE — Progress Notes (Signed)
[facility]  Patient not examined today as he has left for a funeral. HD orders placed for tomorrow.  Anice Paganini, [name] 11/10/2020, 11:39 AM  [employer]

## 2020-11-10 NOTE — Progress Notes (Signed)
TOC CM/CSW received a call from [facility] [phone].  Rosendo Gros called to verify if the pt was still in the ED awaiting placement or if he had went back to [place] with family.  CSW updated Rosendo Gros on pts current status.  CSW will continue to follow for dc needs.  Arliss Hepburn Tarpley-Carter, MSW, LCSW-A Pronouns:  She, Her, Hers                  [place] ED Transitions of CareClinical Social Worker [email or web address] [email or web address] [phone]

## 2020-11-10 NOTE — Progress Notes (Signed)
PT Cancellation Note  Patient Details Name: Ernest [name] MRN: [id number] DOB: April 10, 1946   Cancelled Treatment:    Reason Eval/Treat Not Completed: Patient at procedure or test/unavailable Will follow up as schedule allows.   Reuel Derby, PT, DPT  Acute Rehabilitation Services  Pager: [phone] Office: [phone]    Rudean Hitt 11/10/2020, 11:13 AM

## 2020-11-10 NOTE — ED Provider Notes (Signed)
Emergency Medicine Observation Re-evaluation Note  Ernest [name] is a 75 y.o. male, seen on rounds today.  Pt initially presented to the ED for complaints of Social Work (Patient's wife passed today in [facility]. EMS was called by PD due to wife passing and her being patient's primary caregiver. ) Currently, the patient is resting comfortably.  Physical Exam  BP 133/66 (BP Location: Right Arm)   Pulse 71   Temp 98.9 F (37.2 C) (Oral)   Resp 18   Ht '5\' 9"'$  (1.753 m)   Wt 83 kg   SpO2 93%   BMI 27.02 kg/m  Physical Exam General: Sleeping in bed, answers to name Skin: warm and dry Lungs: no respiratory distress Psych: currently calm and resting  ED Course / MDM  EKG:    I have reviewed the labs performed to date as well as medications administered while in observation.   Plan  Current plan is for case manager to continue working with the family for placement.   Lorelle Gibbs, DO 11/10/20 [id number]

## 2020-11-10 NOTE — ED Notes (Signed)
Patient ate 100%AND DRINK 240 FOR BREAKFAST

## 2020-11-11 LAB — RENAL FUNCTION PANEL
Albumin: 2.8 g/dL — ABNORMAL LOW (ref 3.5–5.0)
Anion gap: 16 — ABNORMAL HIGH (ref 5–15)
BUN: 40 mg/dL — ABNORMAL HIGH (ref 8–23)
CO2: 22 mmol/L (ref 22–32)
Calcium: 8.9 mg/dL (ref 8.9–10.3)
Chloride: 96 mmol/L — ABNORMAL LOW (ref 98–111)
Creatinine, Ser: 7.55 mg/dL — ABNORMAL HIGH (ref 0.61–1.24)
GFR, Estimated: 7 mL/min — ABNORMAL LOW (ref >60)
Glucose, Bld: 88 mg/dL (ref 70–99)
Phosphorus: 5 mg/dL — ABNORMAL HIGH (ref 2.5–4.6)
Potassium: 4.6 mmol/L (ref 3.5–5.1)
Sodium: 134 mmol/L — ABNORMAL LOW (ref 135–145)

## 2020-11-11 LAB — CBC
HCT: 36.3 % — ABNORMAL LOW (ref 39.0–52.0)
Hemoglobin: 11.7 g/dL — ABNORMAL LOW (ref 13.0–17.0)
MCH: 30.6 pg (ref 26.0–34.0)
MCHC: 32.2 g/dL (ref 30.0–36.0)
MCV: 95 fL (ref 80.0–100.0)
Platelets: 192 10*3/uL (ref 150–400)
RBC: 3.82 MIL/uL — ABNORMAL LOW (ref 4.22–5.81)
RDW: 14.3 % (ref 11.5–15.5)
WBC: 8.6 10*3/uL (ref 4.0–10.5)
nRBC: 0 % (ref 0.0–0.2)

## 2020-11-11 LAB — CBG MONITORING, ED: Glucose-Capillary: 131 mg/dL — ABNORMAL HIGH (ref 70–99)

## 2020-11-11 NOTE — ED Notes (Signed)
Breakfast tray given. °

## 2020-11-11 NOTE — Progress Notes (Signed)
Physical Therapy Treatment Patient Details Name: Ernest [name] MRN: [id number] DOB: August 17, 1946 Today's Date: 11/11/2020    History of Present Illness Pt is a 75 y/o male presenting to the hospital for SNF placement as his wife who was his primary caregiver was killed in an MVC. PMH includes HTN, CVA with L sided deficits, ESRD on HD, COPD, CAD, DVT, and Hep C.    PT Comments    Pt progressing towards goals. Practiced short distance gait in room X2 this session. Requiring min A for steadying and fatiguing easily. Current recommendations appropriate. Will continue to follow acutely.    Follow Up Recommendations  SNF;Supervision/Assistance - 24 hour     Equipment Recommendations  None recommended by PT    Recommendations for Other Services       Precautions / Restrictions Precautions Precautions: Fall Restrictions Weight Bearing Restrictions: No    Mobility  Bed Mobility Overal bed mobility: Needs Assistance Bed Mobility: Supine to Sit;Sit to Supine     Supine to sit: Min assist Sit to supine: Supervision   General bed mobility comments: Min A for trunk elevation  Transfers Overall transfer level: Needs assistance Equipment used: Rolling walker (2 wheeled) Transfers: Sit to/from [facility] Sit to Stand: Min assist         General transfer comment: Min A for lift assist and steadying to stand. Stood X 3 this session  Ambulation/Gait Ambulation/Gait assistance: Herbalist (Feet): 8 Feet (X2) Assistive device: Rolling walker (2 wheeled) Gait Pattern/deviations: Step-through pattern;Decreased stride length Gait velocity: Decreased   General Gait Details: Exaggerated steps secondary to weakness. Min A for steadying. Performed gait trial X2. Required seated rest in between secondary to fatigue.   Stairs             Wheelchair Mobility    Modified Rankin (Stroke Patients Only)       Balance Overall balance assessment:  Needs assistance Sitting-balance support: No upper extremity supported Sitting balance-Leahy Scale: Fair     Standing balance support: Bilateral upper extremity supported Standing balance-Leahy Scale: Poor Standing balance comment: Reliant on UE and external support                            Cognition Arousal/Alertness: Awake/alert Behavior During Therapy: WFL for tasks assessed/performed Overall Cognitive Status: No family/caregiver present to determine baseline cognitive functioning                                 General Comments: Likely close to his baseline. Required increased time to respond to questions.      Exercises General Exercises - Lower Extremity Hip Flexion/Marching: AROM;Both;10 reps;Standing;Other (comment) (using RW)    General Comments        Pertinent Vitals/Pain Pain Assessment: No/denies pain    Home Living                      Prior Function            PT Goals (current goals can now be found in the care plan section) Acute Rehab PT Goals Patient Stated Goal: none stated PT Goal Formulation: With patient Time For Goal Achievement: 11/18/20 Potential to Achieve Goals: Good Progress towards PT goals: Progressing toward goals    Frequency    Min 2X/week      PT Plan Current plan remains appropriate  Co-evaluation              AM-PAC PT "6 Clicks" Mobility   Outcome Measure  Help needed turning from your back to your side while in a flat bed without using bedrails?: A Little Help needed moving from lying on your back to sitting on the side of a flat bed without using bedrails?: A Little Help needed moving to and from a bed to a chair (including a wheelchair)?: A Little Help needed standing up from a chair using your arms (e.g., wheelchair or bedside chair)?: A Little Help needed to walk in hospital room?: A Little Help needed climbing 3-5 steps with a railing? : A Lot 6 Click Score: 17     End of Session Equipment Utilized During Treatment: Gait belt Activity Tolerance: Patient tolerated treatment well Patient left: with call bell/phone within reach;in chair (in recliner in ED) Nurse Communication: Mobility status PT Visit Diagnosis: Unsteadiness on feet (R26.81);Muscle weakness (generalized) (M62.81);Difficulty in walking, not elsewhere classified (R26.2)     Time: [id number] PT Time Calculation (min) (ACUTE ONLY): 19 min  Charges:  $Therapeutic Activity: 8-22 mins                     Lou Miner, DPT  Acute Rehabilitation Services  Pager: [phone] Office: [phone]    Rudean Hitt 11/11/2020, 4:58 PM

## 2020-11-11 NOTE — ED Notes (Signed)
Resting on the bed , resting offered TV states he doesn't want to watch TV now.

## 2020-11-11 NOTE — ED Notes (Signed)
Daughter at bedside assisting patient with meal tray.

## 2020-11-11 NOTE — Progress Notes (Signed)
@   2000 Floor RN Beatris Ship made aware the patient Hemodialysis treatment will not be done until tomorrow.

## 2020-11-11 NOTE — Progress Notes (Signed)
Case discussed in Difficult to Place Meeting. Reached out to [employer] for assistance. They are reviewing his clinical information. Aneta Mins Transition of Care Supervisor [phone]

## 2020-11-11 NOTE — ED Notes (Signed)
Dinner Trays Ordered @ 1635. 

## 2020-11-11 NOTE — ED Notes (Signed)
Bed linens cleaned .

## 2020-11-12 LAB — RENAL FUNCTION PANEL
Albumin: 2.9 g/dL — ABNORMAL LOW (ref 3.5–5.0)
Anion gap: 13 (ref 5–15)
BUN: 50 mg/dL — ABNORMAL HIGH (ref 8–23)
CO2: 25 mmol/L (ref 22–32)
Calcium: 8.8 mg/dL — ABNORMAL LOW (ref 8.9–10.3)
Chloride: 94 mmol/L — ABNORMAL LOW (ref 98–111)
Creatinine, Ser: 8.83 mg/dL — ABNORMAL HIGH (ref 0.61–1.24)
GFR, Estimated: 6 mL/min — ABNORMAL LOW (ref >60)
Glucose, Bld: 130 mg/dL — ABNORMAL HIGH (ref 70–99)
Phosphorus: 5.5 mg/dL — ABNORMAL HIGH (ref 2.5–4.6)
Potassium: 4.8 mmol/L (ref 3.5–5.1)
Sodium: 132 mmol/L — ABNORMAL LOW (ref 135–145)

## 2020-11-12 LAB — CBC
HCT: 38 % — ABNORMAL LOW (ref 39.0–52.0)
Hemoglobin: 12.4 g/dL — ABNORMAL LOW (ref 13.0–17.0)
MCH: 30.2 pg (ref 26.0–34.0)
MCHC: 32.6 g/dL (ref 30.0–36.0)
MCV: 92.5 fL (ref 80.0–100.0)
Platelets: 210 10*3/uL (ref 150–400)
RBC: 4.11 MIL/uL — ABNORMAL LOW (ref 4.22–5.81)
RDW: 14.2 % (ref 11.5–15.5)
WBC: 7.3 10*3/uL (ref 4.0–10.5)
nRBC: 0 % (ref 0.0–0.2)

## 2020-11-12 LAB — CBG MONITORING, ED: Glucose-Capillary: 95 mg/dL (ref 70–99)

## 2020-11-12 NOTE — ED Notes (Addendum)
Pt returned from dialysis at this time. Fed lunch and remains dry.

## 2020-11-12 NOTE — ED Notes (Signed)
Dialysis called and states that pt will be going to dialysis in the morning.

## 2020-11-12 NOTE — ED Notes (Signed)
Breakfast Ordered 

## 2020-11-12 NOTE — Procedures (Signed)
   I was present at this dialysis session, have reviewed the session itself and made  appropriate changes Kelly Splinter MD [place] pager [phone]   11/12/2020, 3:03 PM

## 2020-11-12 NOTE — ED Notes (Signed)
RN called dialysis and they state he is on the list but no time right now.

## 2020-11-12 NOTE — ED Notes (Signed)
Diaper changed. Urine occurrence x 1. Pt has no complaints. Pt back to sleep.

## 2020-11-12 NOTE — Progress Notes (Signed)
CSW sent patient's clinical information to [facility] for review via the [place].  Madilyn Fireman, MSW, LCSW-A Transitions of Care  Clinical Social Worker I [phone]

## 2020-11-13 LAB — CBG MONITORING, ED: Glucose-Capillary: 87 mg/dL (ref 70–99)

## 2020-11-13 MED ORDER — CINACALCET HCL 30 MG PO TABS
30.0000 mg | ORAL_TABLET | ORAL | Status: DC
  Administered 2020-11-16 – 2020-11-18 (×2): 30 mg via ORAL
  Filled 2020-11-13 (×4): qty 1

## 2020-11-13 MED ORDER — DOXERCALCIFEROL 4 MCG/2ML IV SOLN
5.0000 ug | INTRAVENOUS | Status: DC
  Administered 2020-11-14: 5 ug via INTRAVENOUS
  Filled 2020-11-13 (×3): qty 4

## 2020-11-13 NOTE — ED Notes (Signed)
Dinner tray delivered.

## 2020-11-13 NOTE — Progress Notes (Signed)
75 Y/O ESRD from [facility] who is holding in ED for SNF placement D/T sudden death of wife, no caregiver.  He had HD Monday, Wednesday and Saturday last week D/T high census, staffing issues. Next HD planned 11/15/2019.   [place] 4:15 Hrs 180NRe 450/800 2.0K/2.0 Ca AVG -Heparin 4000 units IV TIW -Hectorol 5 mcg IV TIW -Sensipar 30 mg PO TIW -Venofer 100 mg IV weekly    Juanell Fairly [facility] [phone]

## 2020-11-13 NOTE — ED Notes (Signed)
Pt is sitting up at edge of bed drinking a coffee and eating applesauce.

## 2020-11-14 ENCOUNTER — Emergency Department (HOSPITAL_COMMUNITY): Payer: Medicare (Managed Care)

## 2020-11-14 LAB — CBC
HCT: 34.7 % — ABNORMAL LOW (ref 39.0–52.0)
Hemoglobin: 11.7 g/dL — ABNORMAL LOW (ref 13.0–17.0)
MCH: 30.6 pg (ref 26.0–34.0)
MCHC: 33.7 g/dL (ref 30.0–36.0)
MCV: 90.8 fL (ref 80.0–100.0)
Platelets: 214 10*3/uL (ref 150–400)
RBC: 3.82 MIL/uL — ABNORMAL LOW (ref 4.22–5.81)
RDW: 13.7 % (ref 11.5–15.5)
WBC: 7.5 10*3/uL (ref 4.0–10.5)
nRBC: 0 % (ref 0.0–0.2)

## 2020-11-14 LAB — RENAL FUNCTION PANEL
Albumin: 2.7 g/dL — ABNORMAL LOW (ref 3.5–5.0)
Anion gap: 13 (ref 5–15)
BUN: 45 mg/dL — ABNORMAL HIGH (ref 8–23)
CO2: 25 mmol/L (ref 22–32)
Calcium: 8.5 mg/dL — ABNORMAL LOW (ref 8.9–10.3)
Chloride: 92 mmol/L — ABNORMAL LOW (ref 98–111)
Creatinine, Ser: 8.26 mg/dL — ABNORMAL HIGH (ref 0.61–1.24)
GFR, Estimated: 6 mL/min — ABNORMAL LOW (ref >60)
Glucose, Bld: 62 mg/dL — ABNORMAL LOW (ref 70–99)
Phosphorus: 5.3 mg/dL — ABNORMAL HIGH (ref 2.5–4.6)
Potassium: 4.8 mmol/L (ref 3.5–5.1)
Sodium: 130 mmol/L — ABNORMAL LOW (ref 135–145)

## 2020-11-14 LAB — CBG MONITORING, ED
Glucose-Capillary: 187 mg/dL — ABNORMAL HIGH (ref 70–99)
Glucose-Capillary: 191 mg/dL — ABNORMAL HIGH (ref 70–99)

## 2020-11-14 MED ORDER — HEPARIN SODIUM (PORCINE) 1000 UNIT/ML DIALYSIS: 4000.0000 [IU] | INTRAMUSCULAR | Status: DC | PRN

## 2020-11-14 MED ORDER — DOXERCALCIFEROL 4 MCG/2ML IV SOLN
INTRAVENOUS | Status: AC
  Filled 2020-11-14: qty 4

## 2020-11-14 MED ORDER — SODIUM CHLORIDE 0.9 % IV SOLN: 100.0000 mL | INTRAVENOUS | Status: DC | PRN

## 2020-11-14 NOTE — ED Notes (Signed)
Patient eat 100%drank 120

## 2020-11-14 NOTE — Procedures (Signed)
I was present at this dialysis session. I have reviewed the session itself and made appropriate changes.   Vital signs in last 24 hours:  Temp:  [97.4 F (36.3 C)-98.8 F (37.1 C)] 98.7 F (37.1 C) (01/31 0529) Pulse Rate:  [71-94] 71 (01/31 0529) Resp:  [16-18] 16 (01/31 0529) BP: (92-148)/(44-85) 101/44 (01/31 0529) SpO2:  [93 %-96 %] 95 % (01/31 0529) Weight change:  Filed Weights   11/09/20 1220 11/12/20 1220 11/12/20 1459  Weight: 83 kg 86.9 kg 85.1 kg    Recent Labs  Lab 11/14/20 0757  NA 130*  K 4.8  CL 92*  CO2 25  GLUCOSE 62*  BUN 45*  CREATININE 8.26*  CALCIUM 8.5*  PHOS 5.3*    Recent Labs  Lab 11/10/20 0540 11/12/20 0450 11/14/20 0757  WBC 8.6 7.3 7.5  HGB 11.7* 12.4* 11.7*  HCT 36.3* 38.0* 34.7*  MCV 95.0 92.5 90.8  PLT 192 210 214    Scheduled Meds: . allopurinol  100 mg Oral Daily  . amiodarone  200 mg Oral Daily  . aspirin EC  81 mg Oral Daily  . atorvastatin  40 mg Oral Daily  . calcium acetate  667 mg Oral TID WC  . cinacalcet  30 mg Oral Q M,W,F-HD  . cyproheptadine  2 mg Oral TID WC  . doxercalciferol  5 mcg Intravenous Q M,W,F-HD  . heparin  2,000 Units Dialysis Once in dialysis  . insulin detemir  20 Units Subcutaneous Daily  . levETIRAcetam  500 mg Oral 2 times per day on Sun Tue Thu Sat  . levETIRAcetam  500 mg Oral 3 times per day on Mon Wed Fri  . mirtazapine  7.5 mg Oral QHS  . polyethylene glycol  17 g Oral Daily  . sertraline  25 mg Oral QHS   Continuous Infusions: . sodium chloride    . sodium chloride    . sodium chloride    . sodium chloride    . sodium chloride     PRN Meds:.sodium chloride, sodium chloride, sodium chloride, sodium chloride, sodium chloride, acetaminophen, heparin, lidocaine (PF), lidocaine-prilocaine, pentafluoroprop-tetrafluoroeth, polyvinyl alcohol   [place] 4:15 Hrs 180NRe 450/800 2.0K/2.0 Ca AVG -Heparin 4000 units IV TIW -Hectorol 5 mcg IV TIW -Sensipar 30 mg PO  TIW -Venofer 100 mg IV weekly   74 Y/O ESRD from [facility] who is holding in ED for SNF placement D/T sudden death of wife, no caregiver.  He had HD Monday, Wednesday and Saturday last week D/T high census, staffing issues.  Now back on schedule.    Donetta Potts,  MD 11/14/2020, 9:15 AM

## 2020-11-14 NOTE — ED Notes (Signed)
Breakfast ordered 

## 2020-11-14 NOTE — Progress Notes (Signed)
PT Cancellation Note  Patient Details Name: TYRENCE FANTASIA MRN: [id number] DOB: [birth year]   Cancelled Treatment:    Reason Eval/Treat Not Completed: Patient at procedure or test/unavailable Pt currently at HD. Will follow up as schedule allows.   Lou Miner, DPT  Acute Rehabilitation Services  Pager: [phone] Office: [phone]    Rudean Hitt 11/14/2020, 10:19 AM

## 2020-11-14 NOTE — Progress Notes (Signed)
Renal Navigator received call from [facility] Supervisor/J. Scinto that patient has received bed offer at [facility] and now needs HD in [place]. Given patient is a current [facility] patient, Navigator called [facility] and asked that he be switched to [facility] on [address]. Patient is vaccinated for COVID, which is a requirement for this HD Clinic. Navigator was informed by Ms. Scinto that [facility] requires a MWF schedule. Navigator has left message for [facility] to request MWF schedule in order to accommodate [place] transportation.  Navigator to follow closely.   Alphonzo Cruise, [place] Renal Navigator [phone]

## 2020-11-14 NOTE — Progress Notes (Signed)
Physical Therapy Treatment Patient Details Name: Ernest [name] MRN: [id number] DOB: 1946/04/19 Today's Date: 11/14/2020    History of Present Illness Pt is a 75 y/o male presenting to the hospital for SNF placement as his wife who was his primary caregiver was killed in an MVC. PMH includes HTN, CVA with L sided deficits, ESRD on HD, COPD, CAD, DVT, and Hep C.    PT Comments    Pt progressing towards goals.  Pt just getting back from HD and reporting increased fatigue and weakness, so mobility limited to chair. Requiring min A for steadying assist to stand and transfer to chair with RW. Educated about HEP. Current recommendations appropriate. Will continue to follow acutely.   Follow Up Recommendations  SNF;Supervision/Assistance - 24 hour     Equipment Recommendations  None recommended by PT    Recommendations for Other Services       Precautions / Restrictions Precautions Precautions: Fall Restrictions Weight Bearing Restrictions: No    Mobility  Bed Mobility Overal bed mobility: Needs Assistance Bed Mobility: Supine to Sit;Sit to Supine     Supine to sit: Min guard Sit to supine: Supervision   General bed mobility comments: Min guard for safety.  Transfers Overall transfer level: Needs assistance Equipment used: Rolling walker (2 wheeled) Transfers: Sit to/from [facility] Sit to Stand: Min assist Stand pivot transfers: Min assist       General transfer comment: Min A for lift assist and steadying to stand and transfer to chair. Pt reporting increased weakness following HD, so mobility limited to chair.  Ambulation/Gait                 Stairs             Wheelchair Mobility    Modified Rankin (Stroke Patients Only)       Balance Overall balance assessment: Needs assistance Sitting-balance support: No upper extremity supported Sitting balance-Leahy Scale: Fair     Standing balance support: Bilateral upper extremity  supported Standing balance-Leahy Scale: Poor Standing balance comment: Reliant on UE and external support                            Cognition Arousal/Alertness: Awake/alert Behavior During Therapy: WFL for tasks assessed/performed Overall Cognitive Status: No family/caregiver present to determine baseline cognitive functioning                                 General Comments: Likely close to his baseline. Required increased time to respond to questions.      Exercises General Exercises - Lower Extremity Ankle Circles/Pumps: AROM;Both;10 reps;Seated Quad Sets: AROM;Both;10 reps;Seated Long Arc Quad: AROM;Both;10 reps;Seated Hip Flexion/Marching: AROM;Both;10 reps;Seated    General Comments        Pertinent Vitals/Pain      Home Living                      Prior Function            PT Goals (current goals can now be found in the care plan section) Acute Rehab PT Goals Patient Stated Goal: none stated PT Goal Formulation: With patient Time For Goal Achievement: 11/18/20 Potential to Achieve Goals: Good Progress towards PT goals: Progressing toward goals    Frequency    Min 2X/week      PT Plan Current plan remains appropriate  Co-evaluation              AM-PAC PT "6 Clicks" Mobility   Outcome Measure  Help needed turning from your back to your side while in a flat bed without using bedrails?: A Little Help needed moving from lying on your back to sitting on the side of a flat bed without using bedrails?: A Little Help needed moving to and from a bed to a chair (including a wheelchair)?: A Little Help needed standing up from a chair using your arms (e.g., wheelchair or bedside chair)?: A Little Help needed to walk in hospital room?: A Little Help needed climbing 3-5 steps with a railing? : A Lot 6 Click Score: 17    End of Session Equipment Utilized During Treatment: Gait belt Activity Tolerance: Patient  tolerated treatment well Patient left: with call bell/phone within reach;in chair (in recliner in ED) Nurse Communication: Mobility status PT Visit Diagnosis: Unsteadiness on feet (R26.81);Muscle weakness (generalized) (M62.81);Difficulty in walking, not elsewhere classified (R26.2)     Time: [id number] PT Time Calculation (min) (ACUTE ONLY): 18 min  Charges:  $Therapeutic Activity: 8-22 mins                     Ernest [name], DPT  Acute Rehabilitation Services  Pager: [phone] Office: [phone]    Rudean Hitt 11/14/2020, 2:17 PM

## 2020-11-14 NOTE — Progress Notes (Signed)
[facility] can accommodate patient on a MWF schedule with a seat time of 6:40am. He needs to arrive at 6:20am for this seat time. Navigator left message for TOC Supervisor/J. Scinto.   Alphonzo Cruise, [place] Renal Navigator [phone]

## 2020-11-14 NOTE — Progress Notes (Addendum)
CSW spoke with Pt's son, Demetrick @ [phone] to update about bed offer at [facility].  Soen, who is Pt's PoA. Gave his email as [email or web address]  To facilitate communication between family and [place].   CSW updated Lexine Baton of [place] with Demetrick's # and email.

## 2020-11-15 LAB — CBG MONITORING, ED: Glucose-Capillary: 93 mg/dL (ref 70–99)

## 2020-11-15 MED ORDER — CHLORHEXIDINE GLUCONATE CLOTH 2 % EX PADS: 6.0000 | MEDICATED_PAD | Freq: Every day | CUTANEOUS | Status: DC

## 2020-11-15 MED ORDER — SENNOSIDES-DOCUSATE SODIUM 8.6-50 MG PO TABS
1.0000 | ORAL_TABLET | Freq: Every day | ORAL | Status: DC
  Administered 2020-11-15 – 2020-11-17 (×3): 1 via ORAL
  Filled 2020-11-15 (×3): qty 1

## 2020-11-15 NOTE — ED Notes (Signed)
Social work at bedside.  

## 2020-11-15 NOTE — ED Notes (Signed)
Daughter at bedside.  States that her father (patient) would like to speak with social work about concerns related to Son being [place] among other things.   Social work contacted.  They will come when they are able.

## 2020-11-15 NOTE — ED Notes (Signed)
Renal provider at bedside

## 2020-11-15 NOTE — Progress Notes (Addendum)
[facility] Progress Note   Subjective:   Pt seen in room. Had HD yesterday with net UF 841m. Reports he is having constipation. No abdominal pain or nausea, appetite is stable. Denies SOB, CP, palpitations or dizziness.   Objective Vitals:   11/14/20 1130 11/14/20 1441 11/14/20 2159 11/15/20 0646  BP: 112/62 (!) 110/52 125/62 138/62  Pulse: 62 80 83 77  Resp: '18 18 18 18  '$ Temp:  99 F (37.2 C) 99.1 F (37.3 C) 98.4 F (36.9 C)  TempSrc:  Oral Oral Oral  SpO2: 100% 98% 94% 94%  Weight:      Height:       Physical Exam General: Well developed male, alert and in NAD Heart: RRR, no murmr Lungs: CTA bilaterally, no wheezing/rhonchi or rales Abdomen: Soft, non-tender, non-distended, +BS Extremities: No edema b/l lower extremities Dialysis Access: LUE AVG + bruit  Additional Objective Labs: Basic Metabolic Panel: Recent Labs  Lab 11/10/20 0540 11/12/20 0450 11/14/20 0757  NA 134* 132* 130*  K 4.6 4.8 4.8  CL 96* 94* 92*  CO2 '22 25 25  '$ GLUCOSE 88 130* 62*  BUN 40* 50* 45*  CREATININE 7.55* 8.83* 8.26*  CALCIUM 8.9 8.8* 8.5*  PHOS 5.0* 5.5* 5.3*   Liver Function Tests: Recent Labs  Lab 11/10/20 0540 11/12/20 0450 11/14/20 0757  ALBUMIN 2.8* 2.9* 2.7*   No results for input(s): LIPASE, AMYLASE in the last 168 hours. CBC: Recent Labs  Lab 11/09/20 0957 11/10/20 0540 11/12/20 0450 11/14/20 0757  WBC 8.4 8.6 7.3 7.5  HGB 11.9* 11.7* 12.4* 11.7*  HCT 34.4* 36.3* 38.0* 34.7*  MCV 91.0 95.0 92.5 90.8  PLT 203 192 210 214   Blood Culture    Component Value Date/Time   SDES PLEURAL LEFT 09/30/2019 1122   SDES PLEURAL LEFT 09/30/2019 1122   SPECREQUEST NONE 09/30/2019 1122   SPECREQUEST NONE 09/30/2019 1122   CULT  09/30/2019 1122    NO GROWTH 5 DAYS Performed at [facility] [id number], [place] [id number]   REPTSTATUS 09/30/2019 FINAL 09/30/2019 1122   REPTSTATUS 10/05/2019 FINAL 09/30/2019 1122    CBG: Recent  Labs  Lab 11/12/20 2035 11/13/20 0748 11/14/20 1422 11/14/20 1726 11/15/20 0736  GLUCAP 95 87 187* 191* 93    Studies/Results: CT Head Wo Contrast  Result Date: 11/14/2020 CLINICAL DATA:  Dizziness, nonspecific.  Altered mental status. EXAM: CT HEAD WITHOUT CONTRAST TECHNIQUE: Contiguous axial images were obtained from the base of the skull through the vertex without intravenous contrast. COMPARISON:  Prior head CT examinations 09/30/2019 and earlier FINDINGS: Brain: Moderate cerebral and cerebellar atrophy. Unchanged prominent perivascular space versus chronic lacunar infarct within the left thalamus Redemonstrated mineralization within the bilateral cerebral white matter, basal ganglia, pons and bilateral cerebellar hemispheres. Moderate ill-defined hypoattenuation within the cerebral white matter is nonspecific, but compatible with chronic small vessel ischemic disease. Redemonstrated chronic lacunar infarcts within the bilateral cerebellar hemispheres and dorsal pons. There is no acute intracranial hemorrhage. No demarcated cortical infarct. No extra-axial fluid collection. No evidence of intracranial mass. No midline shift. Vascular: No hyperdense vessel.  Atherosclerotic calcifications Skull: Normal. Negative for fracture or focal lesion. Sinuses/Orbits: Visualized orbits show no acute finding. Frothy secretions and mucosal thickening near completely opacifies the right sphenoid sinus. Mild left sphenoid sinus mucosal thickening. IMPRESSION: No evidence of acute intracranial abnormality. Redemonstrated prominent perivascular space versus chronic lacunar infarct within the posterior left thalamus. Stable back moderate generalized atrophy of the brain  and cerebral white matter chronic small vessel ischemic disease. Redemonstrated chronic lacunar infarcts within the bilateral cerebellar hemispheres and dorsal pons. Paranasal sinus disease, most notably severe right sphenoid sinusitis Electronically  Signed   By: Kellie Simmering DO   On: 11/14/2020 19:17   Medications: . sodium chloride    . sodium chloride    . sodium chloride    . sodium chloride    . sodium chloride     . allopurinol  100 mg Oral Daily  . amiodarone  200 mg Oral Daily  . aspirin EC  81 mg Oral Daily  . atorvastatin  40 mg Oral Daily  . calcium acetate  667 mg Oral TID WC  . cinacalcet  30 mg Oral Q M,W,F-HD  . cyproheptadine  2 mg Oral TID WC  . doxercalciferol  5 mcg Intravenous Q M,W,F-HD  . heparin  2,000 Units Dialysis Once in dialysis  . insulin detemir  20 Units Subcutaneous Daily  . levETIRAcetam  500 mg Oral 2 times per day on Sun Tue Thu Sat  . levETIRAcetam  500 mg Oral 3 times per day on Mon Wed Fri  . mirtazapine  7.5 mg Oral QHS  . polyethylene glycol  17 g Oral Daily  . sertraline  25 mg Oral QHS    Dialysis Orders: [facility] 4:15 Hrs 180NRe 450/800 2.0K/2.0 Ca AVG 84kg -Heparin 4000 units IV TIW -Hectorol 5 mcg IV TIW -Sensipar 30 mg PO TIW -Venofer 100 mg IV weekly  Assessment/Plan: 1.Needs SNF: Patient's wife, who was his care taker, passed away. Currently boarding in ED until SNF placement is obtained. 2. ESRD:Dialyzes on MWF schedule, tolerating well. 3.HTN/volume:BP variable,overall controlled, soft after HD.No SOB or edema but mild hyponatremia noted. UFG 2 L with HD tomorrow as tolerated.  4. Anemia:Hgb 11.7. No ESA indicated. 5. Secondary hyperparathyroidism:Calcium and phos controlled. Phos 5.8. Continue sensipar and low Ca bath. Continue phoslo 6. Constipation: Has PRN miralax ordered, Will add on daily stool softener.   Anice Paganini, [name] 11/15/2020, 12:12 PM  [facility] Pager: [phone]  I have seen and examined this patient and agree with plan and assessment in the above note with renal recommendations/intervention highlighted.  Accepted at [facility], SW involved.  Broadus Candy A Tashayla Therien,MD 11/15/2020 2:50 PM

## 2020-11-15 NOTE — Progress Notes (Signed)
CSW spoke with daughter-in-law @ [phone] to ensure that connection was made with [facility] staff. CSW also spoke with Saprese @ [facility] [employer] [facility] to connect her with DIL as son, Demetrick is feeling ill.

## 2020-11-15 NOTE — ED Notes (Signed)
Lunch Tray Ordered @ 1034. 

## 2020-11-15 NOTE — Progress Notes (Signed)
CSW updated by [facility] Supervisor that pt is able to go to [facility]. CSW reached out to pt's son to give this update but to also ask that paperwork be sent over ot facility as requested prior to facility taking pt. CSW left voicemail asking for call back at this time.  CSW awaiting call back at this time.     Ernest [name], MSW, LCSW [facility] [place] at [place] [phone]

## 2020-11-15 NOTE — ED Provider Notes (Signed)
Emergency Medicine Observation Re-evaluation Note  MERWYN MOLLNER is a 75 y.o. male, seen on rounds today.  Pt initially presented to the ED for complaints of Social Work (Patient's wife passed today in [facility]. EMS was called by PD due to wife passing and her being patient's primary caregiver. ) Currently, the patient is awaiting SNF placement   Physical Exam  BP 138/62 (BP Location: Right Arm)   Pulse 77   Temp 98.4 F (36.9 C) (Oral)   Resp 18   Ht '5\' 9"'$  (1.753 m)   Wt 85.1 kg   SpO2 94%   BMI 27.71 kg/m  Physical Exam General: wdwn Cardiac: normal rate Lungs: clear Psych: pleasant   ED Course / MDM  EKG:    I have reviewed the labs performed to date as well as medications administered while in observation.  Recent changes in the last 24 hours include none  Plan  Current plan is for SNF placement Patient is not under full IVC at this time.   Fransico Meadow, [name] 11/15/20 [place], [place], DO 11/15/20 1037

## 2020-11-15 NOTE — ED Notes (Signed)
Dinner Tray Ordered @ 1651. 

## 2020-11-15 NOTE — ED Notes (Signed)
Boarder Breakfast order placed

## 2020-11-15 NOTE — ED Notes (Signed)
Patient ambulated with 1 assist using walker from the recliner back to the bed-[place],RN

## 2020-11-16 LAB — CBC
HCT: 36 % — ABNORMAL LOW (ref 39.0–52.0)
Hemoglobin: 12 g/dL — ABNORMAL LOW (ref 13.0–17.0)
MCH: 30.4 pg (ref 26.0–34.0)
MCHC: 33.3 g/dL (ref 30.0–36.0)
MCV: 91.1 fL (ref 80.0–100.0)
Platelets: 244 10*3/uL (ref 150–400)
RBC: 3.95 MIL/uL — ABNORMAL LOW (ref 4.22–5.81)
RDW: 13.9 % (ref 11.5–15.5)
WBC: 9 10*3/uL (ref 4.0–10.5)
nRBC: 0 % (ref 0.0–0.2)

## 2020-11-16 LAB — CBG MONITORING, ED
Glucose-Capillary: 103 mg/dL — ABNORMAL HIGH (ref 70–99)
Glucose-Capillary: 105 mg/dL — ABNORMAL HIGH (ref 70–99)

## 2020-11-16 LAB — RENAL FUNCTION PANEL
Albumin: 2.8 g/dL — ABNORMAL LOW (ref 3.5–5.0)
Anion gap: 15 (ref 5–15)
BUN: 43 mg/dL — ABNORMAL HIGH (ref 8–23)
CO2: 25 mmol/L (ref 22–32)
Calcium: 9.6 mg/dL (ref 8.9–10.3)
Chloride: 92 mmol/L — ABNORMAL LOW (ref 98–111)
Creatinine, Ser: 7.75 mg/dL — ABNORMAL HIGH (ref 0.61–1.24)
GFR, Estimated: 7 mL/min — ABNORMAL LOW (ref >60)
Glucose, Bld: 90 mg/dL (ref 70–99)
Phosphorus: 5.5 mg/dL — ABNORMAL HIGH (ref 2.5–4.6)
Potassium: 4.7 mmol/L (ref 3.5–5.1)
Sodium: 132 mmol/L — ABNORMAL LOW (ref 135–145)

## 2020-11-16 MED ORDER — HEPARIN SODIUM (PORCINE) 1000 UNIT/ML DIALYSIS: 3000.0000 [IU] | Freq: Once | INTRAMUSCULAR | Status: DC

## 2020-11-16 NOTE — ED Notes (Signed)
Off floor to Dialysis.

## 2020-11-16 NOTE — ED Notes (Signed)
Pt back from dialysis. Pt A/O x4, Resting, No distress noted at this time. Will continue to monitor.

## 2020-11-16 NOTE — Progress Notes (Signed)
CSW followed up with pt's daughter in law in law to inquire on status of paperwork for [facility]. Per daughter in law they (family) are still carefully reviewing the paperwork at this time. Per daughter in law family may be done with paperwork today. CSW updated ToC Supervisor at this time of this.    Virgie Dad Soha Thorup, MSW, LCSW [facility] and [place] at [place] [phone]

## 2020-11-16 NOTE — Progress Notes (Signed)
CSW spoke with daughter-in-law, Beckie Busing @ [phone] to update. Beckie Busing states that consent to treat paperwork will be completed by 4 pm

## 2020-11-16 NOTE — Procedures (Signed)
I was present at this dialysis session. I have reviewed the session itself and made appropriate changes.   Vital signs in last 24 hours:  Temp:  [97.9 F (36.6 C)-98.2 F (36.8 C)] 98.2 F (36.8 C) (02/02 1155) Pulse Rate:  [77-79] 78 (02/02 1201) Resp:  [16-18] 16 (02/02 1155) BP: (77-172)/(46-81) 102/52 (02/02 1402) SpO2:  [93 %-96 %] 94 % (02/02 1155) Weight:  [89.2 kg] 89.2 kg (02/02 1155) Weight change:  Filed Weights   11/12/20 1220 11/12/20 1459 11/16/20 1155  Weight: 86.9 kg 85.1 kg 89.2 kg    Recent Labs  Lab 11/16/20 1237  NA 132*  K 4.7  CL 92*  CO2 25  GLUCOSE 90  BUN 43*  CREATININE 7.75*  CALCIUM 9.6  PHOS 5.5*    Recent Labs  Lab 11/12/20 0450 11/14/20 0757 11/16/20 1237  WBC 7.3 7.5 9.0  HGB 12.4* 11.7* 12.0*  HCT 38.0* 34.7* 36.0*  MCV 92.5 90.8 91.1  PLT 210 214 244    Scheduled Meds: . allopurinol  100 mg Oral Daily  . amiodarone  200 mg Oral Daily  . aspirin EC  81 mg Oral Daily  . atorvastatin  40 mg Oral Daily  . calcium acetate  667 mg Oral TID WC  . Chlorhexidine Gluconate Cloth  6 each Topical Q0600  . cinacalcet  30 mg Oral Q M,W,F-HD  . cyproheptadine  2 mg Oral TID WC  . doxercalciferol  5 mcg Intravenous Q M,W,F-HD  . heparin  2,000 Units Dialysis Once in dialysis  . heparin  3,000 Units Dialysis Once in dialysis  . insulin detemir  20 Units Subcutaneous Daily  . levETIRAcetam  500 mg Oral 2 times per day on Sun Tue Thu Sat  . levETIRAcetam  500 mg Oral 3 times per day on Mon Wed Fri  . mirtazapine  7.5 mg Oral QHS  . polyethylene glycol  17 g Oral Daily  . senna-docusate  1 tablet Oral QHS  . sertraline  25 mg Oral QHS   Continuous Infusions: . sodium chloride    . sodium chloride    . sodium chloride    . sodium chloride     PRN Meds:.sodium chloride, sodium chloride, sodium chloride, sodium chloride, acetaminophen, heparin, lidocaine (PF), lidocaine-prilocaine, pentafluoroprop-tetrafluoroeth, polyvinyl alcohol     Dialysis Orders: [place] 4:15 Hrs 180NRe 450/800 2.0K/2.0 Ca AVG 84kg -Heparin 4000 units IV TIW -Hectorol 5 mcg IV TIW -Sensipar 30 mg PO TIW -Venofer 100 mg IV weekly  Assessment/Plan: 1.Needs SNF: Patient's wife, who was his care taker, passed away. Currently boarding in ED until SNF placement is obtained. 2. ESRD:Dialyzes on MWF schedule, tolerating well. 3.HTN/volume:BP variable,overall controlled, soft after HD.No SOB or edema but mild hyponatremia noted. UFG 2 L with HD tomorrow as tolerated.  4. Anemia:Hgb 11.7. No ESA indicated. 5. Secondary hyperparathyroidism:Calcium and phos controlled. Phos 5.8. Continue sensipar and low Ca bath. Continue phoslo 6. Constipation: Has PRN miralax ordered, Will add on daily stool softener.   Donetta Potts,  MD 11/16/2020, 2:48 PM

## 2020-11-17 LAB — CBG MONITORING, ED: Glucose-Capillary: 94 mg/dL (ref 70–99)

## 2020-11-17 MED ORDER — CHLORHEXIDINE GLUCONATE CLOTH 2 % EX PADS: 6.0000 | MEDICATED_PAD | Freq: Every day | CUTANEOUS | Status: DC

## 2020-11-17 NOTE — ED Notes (Signed)
Patient sat on the side of the bed to eat breakfast , appetite good. Patient is dry.

## 2020-11-17 NOTE — ED Notes (Signed)
Breakfast ordered 

## 2020-11-17 NOTE — Progress Notes (Signed)
Renal Navigator updated by [facility] Supervisor that  arrangements have been made for patient to discharge to SNF tomorrow. Navigator requests for discharge today in order for patient to be at outpatient HD tomorrow, but was informed that this is not possible at this point due to transportation arrangements from SNF to HD tomorrow on short notice. Navigator informed [employer] (though Navigator had already asked Renal PA to send orders in preparation for patient to be there tomorrow) that patient will dialyze in the hospital tomorrow and will be at outpatient HD on Monday for first treatment at [facility]. Orders good for Monday, but will follow up with PA tomorrow at discharge. Navigator has updated inpatient HD unit to please prioritize ED discharging patient for HD first tomorrow if at all possible.  Navigator will continue to follow.   Alphonzo Cruise, [place] Renal Navigator [phone]

## 2020-11-17 NOTE — ED Notes (Addendum)
Pt resting. Even Resp. No distress noted at this time. Will continue to monitor.

## 2020-11-17 NOTE — Progress Notes (Addendum)
12:03pm-CSW spoke with Ernest [name] from [facility] and was advised that they still have no gotten paperwork from family for pt. CSW updated Nurse, mental health of this and asked to call family back regarding next steps. CSW reached ou tot Mon[place], no answer therefore CSW left voicemail asking for call back to give further updates. CSW awaiting call back at this time.   10:47am-CSW spoke with Daughter in law Ernest [name]. CSW provided [place] with fax number [phone] for hospital as she requested.   10:17am-CSW reached out to pt's daughter in law and was advised that she spoke with Ernest [name] from facility on yesterday to fax the paperwork back to the facility. Pt's daughter in law insurance if paperwork went through, however CSW followed up with Ernest [name] at [facility], and left message at this time. CSW awaiting call back.  9:49am-CSW followed up with Ernest [name] from [facility] to gather further updates on paperwork being signed by family. CSW left message for call back.   Ernest [name], MSW, LCSW [facility] and [place] at [place] [phone]

## 2020-11-17 NOTE — ED Notes (Signed)
Patient stated he was wet patient cleaned and new adult diapter applied. No skin breakdown noted.

## 2020-11-17 NOTE — ED Notes (Addendum)
Returned phone call to son, which was actually the patients daughter's phone number. The daughter wanted an update on her Dad. I let her know that we were waiting on paperwork to be filled out by the family to get the patient placed at [facility]. Per the chart social worker has been contacting daughter in law to fill out paperwork. The daughter said she wasn't aware of paperwork needing to be filled out and questioned why staff was not contacting the next of kin directly instead of daughter in law. I gave the daughter, our social worker's phone number, to follow up with them directly. Daughter also stated she would be by this evening to bring the patient's glasses.

## 2020-11-17 NOTE — ED Notes (Signed)
Dinner tray ordered.

## 2020-11-17 NOTE — Progress Notes (Signed)
CSW spoke D-I-L, Ernest [name] who reports that consent to treat was completed. CSW spoke with Ernest [name] at [facility] who states that paperwork was received and that Pt can be sent tomorrow 2/4 after dialysis.  ToC supervisor updated. Daughter, Ernest [name] updated.

## 2020-11-17 NOTE — Progress Notes (Signed)
Physical Therapy Treatment Patient Details Name: Ernest [name] MRN: [id number] DOB: Mar 13, 1946 Today's Date: 11/17/2020    History of Present Illness Pt is a 75 y/o male presenting to the hospital for SNF placement as his wife who was his primary caregiver was killed in an MVC. PMH includes HTN, CVA with L sided deficits, ESRD on HD, COPD, CAD, DVT, and Hep C.    PT Comments    Pt making good progress today - able to increase gait distance.  Does continue to need min A at times for transfers and safety.  Pt would continue to benefit from SNF placement (likely also, long term placement) at d/c as he was requiring 24 hr care at home.   Follow Up Recommendations  SNF;Supervision/Assistance - 24 hour (SNF (long term placement))     Equipment Recommendations  None recommended by PT    Recommendations for Other Services       Precautions / Restrictions Precautions Precautions: Fall    Mobility  Bed Mobility Overal bed mobility: Needs Assistance Bed Mobility: Supine to Sit     Supine to sit: Supervision;HOB elevated     General bed mobility comments: use of rail  Transfers Overall transfer level: Needs assistance Equipment used: Rolling walker (2 wheeled) Transfers: Sit to/from Stand Sit to Stand: Min guard;Min assist         General transfer comment: Pt stood from bed x 2 with min guard to rise for safety.  After ambulation performed 3 sit to stands from chair with cues for hand placement and controlled descent - as fatigued required min A to rise  Ambulation/Gait Ambulation/Gait assistance: Min assist Gait Distance (Feet): 60 Feet Assistive device: Rolling walker (2 wheeled) Gait Pattern/deviations: Step-through pattern;Decreased stride length;Trunk flexed Gait velocity: decreased   General Gait Details: Cues for increased step length and RW proximity   Stairs             Wheelchair Mobility    Modified Rankin (Stroke Patients Only)       Balance  Overall balance assessment: Needs assistance Sitting-balance support: No upper extremity supported Sitting balance-Leahy Scale: Fair     Standing balance support: Bilateral upper extremity supported Standing balance-Leahy Scale: Poor Standing balance comment: Reliant on UE and external support                            Cognition Arousal/Alertness: Awake/alert Behavior During Therapy: WFL for tasks assessed/performed Overall Cognitive Status: No family/caregiver present to determine baseline cognitive functioning                                 General Comments: Likely close to his baseline. Required increased time to respond to questions.      Exercises Other Exercises Other Exercises: Heel raises in standing with min guard, demo, and RW - x 5 Other Exercises: Standing Marching with min guard, demo, RW - x 6    General Comments        Pertinent Vitals/Pain Pain Assessment: No/denies pain    Home Living                      Prior Function            PT Goals (current goals can now be found in the care plan section) Acute Rehab PT Goals Patient Stated Goal: none stated PT Goal Formulation:  With patient Time For Goal Achievement: 12/01/20 Potential to Achieve Goals: Fair Progress towards PT goals: Progressing toward goals    Frequency    Min 2X/week      PT Plan Current plan remains appropriate    Co-evaluation              AM-PAC PT "6 Clicks" Mobility   Outcome Measure  Help needed turning from your back to your side while in a flat bed without using bedrails?: A Little Help needed moving from lying on your back to sitting on the side of a flat bed without using bedrails?: A Little Help needed moving to and from a bed to a chair (including a wheelchair)?: A Little Help needed standing up from a chair using your arms (e.g., wheelchair or bedside chair)?: A Little Help needed to walk in hospital room?: A  Little Help needed climbing 3-5 steps with a railing? : A Lot 6 Click Score: 17    End of Session Equipment Utilized During Treatment: Gait belt Activity Tolerance: Patient tolerated treatment well Patient left: with call bell/phone within reach;in chair (recliner in ED) Nurse Communication: Mobility status PT Visit Diagnosis: Unsteadiness on feet (R26.81);Muscle weakness (generalized) (M62.81);Difficulty in walking, not elsewhere classified (R26.2)     Time: [id number] PT Time Calculation (min) (ACUTE ONLY): 20 min  Charges:  $Gait Training: 8-22 mins                     Abran Richard, PT Acute Rehab Services Pager [phone] [facility] 11/17/2020, 2:42 PM

## 2020-11-17 NOTE — ED Notes (Signed)
Please call brother Allayne Butcher @ [phone]

## 2020-11-18 DIAGNOSIS — I2510 Atherosclerotic heart disease of native coronary artery without angina pectoris: Secondary | ICD-10-CM | POA: Insufficient documentation

## 2020-11-18 DIAGNOSIS — R509 Fever, unspecified: Secondary | ICD-10-CM | POA: Insufficient documentation

## 2020-11-18 DIAGNOSIS — R52 Pain, unspecified: Secondary | ICD-10-CM | POA: Insufficient documentation

## 2020-11-18 DIAGNOSIS — R197 Diarrhea, unspecified: Secondary | ICD-10-CM | POA: Insufficient documentation

## 2020-11-18 DIAGNOSIS — E8779 Other fluid overload: Secondary | ICD-10-CM | POA: Insufficient documentation

## 2020-11-18 LAB — CBC
HCT: 32.9 % — ABNORMAL LOW (ref 39.0–52.0)
Hemoglobin: 10.9 g/dL — ABNORMAL LOW (ref 13.0–17.0)
MCH: 30.2 pg (ref 26.0–34.0)
MCHC: 33.1 g/dL (ref 30.0–36.0)
MCV: 91.1 fL (ref 80.0–100.0)
Platelets: 238 10*3/uL (ref 150–400)
RBC: 3.61 MIL/uL — ABNORMAL LOW (ref 4.22–5.81)
RDW: 13.8 % (ref 11.5–15.5)
WBC: 8.1 10*3/uL (ref 4.0–10.5)
nRBC: 0 % (ref 0.0–0.2)

## 2020-11-18 LAB — RENAL FUNCTION PANEL
Albumin: 2.7 g/dL — ABNORMAL LOW (ref 3.5–5.0)
Anion gap: 11 (ref 5–15)
BUN: 46 mg/dL — ABNORMAL HIGH (ref 8–23)
CO2: 25 mmol/L (ref 22–32)
Calcium: 9.3 mg/dL (ref 8.9–10.3)
Chloride: 95 mmol/L — ABNORMAL LOW (ref 98–111)
Creatinine, Ser: 8.17 mg/dL — ABNORMAL HIGH (ref 0.61–1.24)
GFR, Estimated: 6 mL/min — ABNORMAL LOW (ref >60)
Glucose, Bld: 53 mg/dL — ABNORMAL LOW (ref 70–99)
Phosphorus: 5.3 mg/dL — ABNORMAL HIGH (ref 2.5–4.6)
Potassium: 5.3 mmol/L — ABNORMAL HIGH (ref 3.5–5.1)
Sodium: 131 mmol/L — ABNORMAL LOW (ref 135–145)

## 2020-11-18 LAB — CBG MONITORING, ED
Glucose-Capillary: 58 mg/dL — ABNORMAL LOW (ref 70–99)
Glucose-Capillary: 119 mg/dL — ABNORMAL HIGH (ref 70–99)

## 2020-11-18 MED ORDER — DEXTROSE 50 % IV SOLN
12.5000 g | Freq: Once | INTRAVENOUS | Status: AC
  Administered 2020-11-18: 12.5 g via INTRAVENOUS

## 2020-11-18 MED ORDER — HEPARIN SODIUM (PORCINE) 1000 UNIT/ML IJ SOLN
INTRAMUSCULAR | Status: AC
  Filled 2020-11-18: qty 3

## 2020-11-18 MED ORDER — HEPARIN SODIUM (PORCINE) 1000 UNIT/ML DIALYSIS
3000.0000 [IU] | INTRAMUSCULAR | Status: AC | PRN
  Administered 2020-11-18: 3000 [IU] via INTRAVENOUS_CENTRAL

## 2020-11-18 MED ORDER — DEXTROSE 50 % IV SOLN
INTRAVENOUS | Status: AC
  Filled 2020-11-18: qty 50

## 2020-11-18 MED ORDER — DOXERCALCIFEROL 4 MCG/2ML IV SOLN
INTRAVENOUS | Status: AC
  Administered 2020-11-18: 5 ug via INTRAVENOUS
  Filled 2020-11-18: qty 4

## 2020-11-18 NOTE — ED Notes (Signed)
Leata Mouse RN (income & report RN) at [facility] in [place] took report for this pt from this Therapist, sports. He is going to Room-302 & the Accepting provider is Gennie Alma, MD.

## 2020-11-18 NOTE — ED Notes (Signed)
V/S not updated & meds not given at this time d/t pt still in dialysis.

## 2020-11-18 NOTE — Procedures (Signed)
I was present at this dialysis session. I have reviewed the session itself and made appropriate changes.   Vital signs in last 24 hours:  Temp:  [97.5 F (36.4 C)-98.5 F (36.9 C)] 98.1 F (36.7 C) (02/04 0745) Pulse Rate:  [50-87] 85 (02/04 0900) Resp:  [16-20] 18 (02/04 0759) BP: (99-169)/(54-71) 99/59 (02/04 0900) SpO2:  [91 %-96 %] 92 % (02/04 0745) Weight:  [87.7 kg] 87.7 kg (02/04 0745) Weight change:  Filed Weights   11/16/20 1155 11/16/20 1437 11/18/20 0745  Weight: 89.2 kg 87.8 kg 87.7 kg    Recent Labs  Lab 11/18/20 0812  NA 131*  K 5.3*  CL 95*  CO2 25  GLUCOSE 53*  BUN 46*  CREATININE 8.17*  CALCIUM 9.3  PHOS 5.3*    Recent Labs  Lab 11/14/20 0757 11/16/20 1237 11/18/20 0812  WBC 7.5 9.0 8.1  HGB 11.7* 12.0* 10.9*  HCT 34.7* 36.0* 32.9*  MCV 90.8 91.1 91.1  PLT 214 244 238    Scheduled Meds: . dextrose      . allopurinol  100 mg Oral Daily  . amiodarone  200 mg Oral Daily  . aspirin EC  81 mg Oral Daily  . atorvastatin  40 mg Oral Daily  . calcium acetate  667 mg Oral TID WC  . Chlorhexidine Gluconate Cloth  6 each Topical Q0600  . Chlorhexidine Gluconate Cloth  6 each Topical Q0600  . cinacalcet  30 mg Oral Q M,W,F-HD  . cyproheptadine  2 mg Oral TID WC  . doxercalciferol  5 mcg Intravenous Q M,W,F-HD  . heparin sodium (porcine)      . insulin detemir  20 Units Subcutaneous Daily  . levETIRAcetam  500 mg Oral 2 times per day on Sun Tue Thu Sat  . levETIRAcetam  500 mg Oral 3 times per day on Mon Wed Fri  . mirtazapine  7.5 mg Oral QHS  . polyethylene glycol  17 g Oral Daily  . senna-docusate  1 tablet Oral QHS  . sertraline  25 mg Oral QHS   Continuous Infusions: . sodium chloride     PRN Meds:.sodium chloride, acetaminophen, polyvinyl alcohol    Assessment/Plan: 1. Disposition- hopeful discharge to SNF after dialysis today and will be on MWF schedule at [facility]. Donetta Potts,  MD 11/18/2020, 9:32 AM

## 2020-11-18 NOTE — ED Notes (Signed)
Breakfast Ordered 

## 2020-11-18 NOTE — ED Notes (Signed)
Pt transported to Dialysis ?

## 2020-11-18 NOTE — ED Notes (Signed)
Pt has returned from dialysis.  

## 2020-11-18 NOTE — Progress Notes (Signed)
CSW follow-up with nursing staff and was informed patient was still in dialysis

## 2020-11-18 NOTE — ED Notes (Signed)
Pt's son called to speak with his father, he was informed of him being in dialysis & stated he would call back later.

## 2020-11-18 NOTE — ED Notes (Signed)
[facility] was called by secretary with no ETA to give, stated they are backed up & it "will be a while."

## 2020-11-18 NOTE — ED Provider Notes (Signed)
Emergency Medicine Observation Re-evaluation Note  Ernest [name] is a 75 y.o. male, seen on rounds today.  Pt initially presented to the ED for complaints of Social Work (Patient's wife passed today in [facility]. EMS was called by PD due to wife passing and her being patient's primary caregiver. ) Currently, the patient is in no acute distress.  Physical Exam  BP (!) 94/55   Pulse 83   Temp 98.1 F (36.7 C) (Oral)   Resp 18   Ht '5\' 9"'$  (1.753 m)   Wt 87.7 kg   SpO2 92%   BMI 28.55 kg/m  Physical Exam General: no distress  ED Course / MDM  EKG:    I have reviewed the labs performed to date as well as medications administered while in observation.  Recent changes in the last 24 hours include patient placed with bed available in SNF today.  Did have dialysis today here in the hospital and they have arranged for outpatient dialysis MWF which the facility will arrange transportation for.  Plan  Current plan is for placement in SNF.  However her nurse tells me that her son, current POA, is in touch with social work.  Apparently patient's daughter would like to be the POA and there is some question of whether she will be able to take care of him at home and not place him in a SNF.  We will continue to work with social work to come up with plan that is satisfactory for patient and family members Patient is not under full IVC at this time.   Delia Heady, [name] 11/18/20 1357    Elnora Morrison, MD 11/18/20 [phone]

## 2020-11-18 NOTE — Progress Notes (Signed)
CBG checked=56, D50 12.'5mg'$  given per MD order and snacks. Rechecked after 66mns, CBG=119

## 2020-11-18 NOTE — ED Notes (Signed)
Patient Lunch at bedside. He ate his Breakfast soon as he returned back from Dialysis. So he stated he will eat Lunch a Little later. He's full rt. Now.

## 2020-12-16 ENCOUNTER — Emergency Department
Admission: EM | Admit: 2020-12-16 | Discharge: 2020-12-16 | Disposition: A | Discharge status: Home / Self Care | Payer: Medicare (Managed Care) | Attending: Emergency Medicine | Admitting: Emergency Medicine

## 2020-12-16 ENCOUNTER — Other Ambulatory Visit: Payer: Self-pay

## 2020-12-16 ENCOUNTER — Emergency Department: Payer: Medicare (Managed Care)

## 2020-12-16 DIAGNOSIS — N186 End stage renal disease: Secondary | ICD-10-CM | POA: Diagnosis not present

## 2020-12-16 DIAGNOSIS — R130 Aphagia: Secondary | ICD-10-CM | POA: Insufficient documentation

## 2020-12-16 DIAGNOSIS — J90 Pleural effusion, not elsewhere classified: Secondary | ICD-10-CM

## 2020-12-16 DIAGNOSIS — Z992 Dependence on renal dialysis: Secondary | ICD-10-CM | POA: Diagnosis not present

## 2020-12-16 DIAGNOSIS — I2510 Atherosclerotic heart disease of native coronary artery without angina pectoris: Secondary | ICD-10-CM | POA: Diagnosis not present

## 2020-12-16 DIAGNOSIS — Z951 Presence of aortocoronary bypass graft: Secondary | ICD-10-CM | POA: Diagnosis not present

## 2020-12-16 DIAGNOSIS — Z79899 Other long term (current) drug therapy: Secondary | ICD-10-CM | POA: Insufficient documentation

## 2020-12-16 DIAGNOSIS — E1122 Type 2 diabetes mellitus with diabetic chronic kidney disease: Secondary | ICD-10-CM | POA: Insufficient documentation

## 2020-12-16 DIAGNOSIS — Z794 Long term (current) use of insulin: Secondary | ICD-10-CM | POA: Diagnosis not present

## 2020-12-16 DIAGNOSIS — Z87891 Personal history of nicotine dependence: Secondary | ICD-10-CM | POA: Insufficient documentation

## 2020-12-16 DIAGNOSIS — I132 Hypertensive heart and chronic kidney disease with heart failure and with stage 5 chronic kidney disease, or end stage renal disease: Secondary | ICD-10-CM | POA: Diagnosis not present

## 2020-12-16 DIAGNOSIS — Z8673 Personal history of transient ischemic attack (TIA), and cerebral infarction without residual deficits: Secondary | ICD-10-CM | POA: Diagnosis not present

## 2020-12-16 DIAGNOSIS — E1140 Type 2 diabetes mellitus with diabetic neuropathy, unspecified: Secondary | ICD-10-CM | POA: Insufficient documentation

## 2020-12-16 DIAGNOSIS — I5043 Acute on chronic combined systolic (congestive) and diastolic (congestive) heart failure: Secondary | ICD-10-CM | POA: Diagnosis not present

## 2020-12-16 DIAGNOSIS — Z7982 Long term (current) use of aspirin: Secondary | ICD-10-CM | POA: Diagnosis not present

## 2020-12-16 DIAGNOSIS — J918 Pleural effusion in other conditions classified elsewhere: Secondary | ICD-10-CM | POA: Diagnosis present

## 2020-12-16 DIAGNOSIS — J449 Chronic obstructive pulmonary disease, unspecified: Secondary | ICD-10-CM | POA: Insufficient documentation

## 2020-12-16 LAB — APTT: aPTT: 30 seconds (ref 24–36)

## 2020-12-16 LAB — CBC WITH DIFFERENTIAL/PLATELET
Abs Immature Granulocytes: 0.1 10*3/uL — ABNORMAL HIGH (ref 0.00–0.07)
Basophils Absolute: 0.1 10*3/uL (ref 0.0–0.1)
Basophils Relative: 1 %
Eosinophils Absolute: 0.2 10*3/uL (ref 0.0–0.5)
Eosinophils Relative: 2 %
HCT: 39.1 % (ref 39.0–52.0)
Hemoglobin: 12.1 g/dL — ABNORMAL LOW (ref 13.0–17.0)
Immature Granulocytes: 1 %
Lymphocytes Relative: 20 %
Lymphs Abs: 1.8 10*3/uL (ref 0.7–4.0)
MCH: 30.6 pg (ref 26.0–34.0)
MCHC: 30.9 g/dL (ref 30.0–36.0)
MCV: 98.7 fL (ref 80.0–100.0)
Monocytes Absolute: 1 10*3/uL (ref 0.1–1.0)
Monocytes Relative: 11 %
Neutro Abs: 6.1 10*3/uL (ref 1.7–7.7)
Neutrophils Relative %: 65 %
Platelets: 190 10*3/uL (ref 150–400)
RBC: 3.96 MIL/uL — ABNORMAL LOW (ref 4.22–5.81)
RDW: 13.9 % (ref 11.5–15.5)
WBC: 9.3 10*3/uL (ref 4.0–10.5)
nRBC: 0 % (ref 0.0–0.2)

## 2020-12-16 LAB — COMPREHENSIVE METABOLIC PANEL
ALT: 21 U/L (ref 0–44)
AST: 22 U/L (ref 15–41)
Albumin: 3.2 g/dL — ABNORMAL LOW (ref 3.5–5.0)
Alkaline Phosphatase: 66 U/L (ref 38–126)
Anion gap: 11 (ref 5–15)
BUN: 14 mg/dL (ref 8–23)
CO2: 29 mmol/L (ref 22–32)
Calcium: 9.2 mg/dL (ref 8.9–10.3)
Chloride: 99 mmol/L (ref 98–111)
Creatinine, Ser: 3.22 mg/dL — ABNORMAL HIGH (ref 0.61–1.24)
GFR, Estimated: 20 mL/min — ABNORMAL LOW (ref >60)
Glucose, Bld: 125 mg/dL — ABNORMAL HIGH (ref 70–99)
Potassium: 4.9 mmol/L (ref 3.5–5.1)
Sodium: 139 mmol/L (ref 135–145)
Total Bilirubin: 0.7 mg/dL (ref 0.3–1.2)
Total Protein: 7 g/dL (ref 6.5–8.1)

## 2020-12-16 LAB — PROTIME-INR
INR: 1 (ref 0.8–1.2)
Prothrombin Time: 12.9 seconds (ref 11.4–15.2)

## 2020-12-16 NOTE — ED Triage Notes (Signed)
Pt to ED via [facility] ftrom diaysis, states [facility] and wanted him admitted to for pleural effusion. Unable to report scans for dx.  Pt denies shob/cp Pt disoriented to time.  Oriented to person, place and situation nad noted, rr even and unlabored

## 2020-12-16 NOTE — ED Notes (Signed)
[facility]  CALLED  FOR  TRANSPORT  TO  [facility] 

## 2020-12-16 NOTE — ED Provider Notes (Signed)
[facility] Emergency Department Provider Note  ____________________________________________   Event Date/Time   First MD Initiated Contact with Patient 12/16/20 1246     (approximate)  I have reviewed the triage vital signs and the nursing notes.   HISTORY  Chief Complaint pleural effusion    HPI Ernest [name] is a 75 y.o. male with prior stroke with left-sided deficits and aphasia, ESRD who comes in for pleural effusion.  Patient had his full session of dialysis.  Patient has baseline aphasia.  Patient is unsure why he is here today but denies any shortness of breath, chest pain.  I called the facility and talk to the NP who states they were doing a routine chest x-ray to look for pulmonary toxicity from his amiodarone and incidentally found a pleural effusion.  He is new to the facility so they were not sure if it was new or old but it appeared on records he had it drained before so they thought he might need to have it drained again.  On review of records ":CT of the chest was performed that illustrated a loculatedeffusion. IR was consulted for thoracentesis and we are consulted for admission for further evaluation/management. Cardiothoracic surgery was consulted by the ED. Did not recommend surgical intervention. Discussed the case with pulmonology. Felt that this was likely secondary to his CABG and because of the chronic nature has formeda rind and loculation. Recommended outpatient follow-up with CT surgery. His cytology is currently still pending. "   Patient is reportedly coming from [facility] and wanted him to be admitted for pleural effusion          Past Medical History:  Diagnosis Date  . Abnormal stress test    s/p cath November 2013 with modest disease involving the ostial left main, proximal LAD, proximal RCA - do not appear to be hemodynamically signficant; mild LV dysfunction  . Anemia    low iron  . Anxiety   . Arthritis   .  Bacteremia   . Chronic systolic CHF (congestive heart failure) ([place])   . COPD (chronic obstructive pulmonary disease) ([place])   . Coronary artery disease    a. per cath report 2013, b. 09/13/2017-CABG X4 & Mitral valve repair  . Depression   . DVT (deep venous thrombosis) ([facility])    arm - right- finished blood thinner- not sure when it  . Ejection fraction < 50%   . Erectile dysfunction    penile implant  . ESRD (end stage renal disease) ([place])    [facility] on a MWF schedule.     . Gout    Hx: of  . Hepatitis C   . History of seizures    last one 2018  . Hyperlipidemia   . Hypertension   . Mitral valve disease    s/p Mitral repair 09/13/17  . Obesity   . Retinopathy   . Shortness of breath    Hx: of with exertion  . Stroke [facility])    "several" left side weakness, speech affected  . Type II or unspecified type diabetes mellitus without mention of complication, not stated as uncontrolled    adult onset    Patient Active Problem List   Diagnosis Date Noted  . Pleural effusion on left 09/30/2019  . Acute respiratory failure with hypoxia ([place]) 09/30/2019  . Pleural effusion 09/30/2019  . Chest pain 12/02/2018  . Vascular dementia ([place]) 10/10/2017  . Paroxysmal atrial flutter ([place]) 10/10/2017  . Altered mental  status, unspecified 10/09/2017  . Other malaise 10/09/2017  . S/P CABG x 4 10/04/2017  . Cerebral embolism with cerebral infarction 09/20/2017  . NSVT (nonsustained ventricular tachycardia) ([place])   . Dyspnea 09/08/2017  . Non-ST elevation MI (NSTEMI) ([place])   . Acute on chronic combined systolic and diastolic heart failure ([place])   . End-stage renal disease on hemodialysis ([place])   . Left leg weakness   . TIA (transient ischemic attack) 10/27/2016  . Dependence on renal dialysis ([place]) 08/09/2015  . History of CVA with residual deficit 08/07/2015  . Seizure, late effect of stroke ([place]) 08/07/2015  . Diabetic peripheral neuropathy ([place]) 08/07/2015  . Unspecified  protein-calorie malnutrition ([place]) 05/27/2015  . Coagulation defect, unspecified ([place]) 07/16/2014  . Type 2 diabetes mellitus with diabetic peripheral angiopathy without gangrene ([place]) 07/01/2014  . Pruritus, unspecified 05/26/2014  . Elevated prostate specific antigen (PSA) 08/03/2013  . Encounter for immunization 07/10/2013  . Obstructive sleep apnea 06/16/2013  . Hyperlipidemia associated with type 2 diabetes mellitus ([place])   . ESRD (end stage renal disease) ([place])   . Erectile dysfunction associated with type 2 diabetes mellitus ([place])   . Hypertension due to end stage renal disease caused by type 2 diabetes mellitus, on dialysis ([place])   . COPD (chronic obstructive pulmonary disease) ([place])   . Coronary artery disease   . Chronic combined systolic and diastolic CHF (congestive heart failure) ([place])   . Anemia in chronic kidney disease 12/27/2011  . Long term (current) use of insulin ([place]) 11/03/2009  . Gout, unspecified 02/28/2009  . Iron deficiency anemia, unspecified 10/30/2008  . Secondary hyperparathyroidism of renal origin ([place]) 10/30/2008  . Uncontrolled type 2 diabetes mellitus with end-stage renal disease ([place]) 06/08/1984    Past Surgical History:  Procedure Laterality Date  . A/V FISTULAGRAM N/A 10/04/2017   Procedure: A/V FISTULAGRAM;  Surgeon: Elam Dutch, MD;  Location: [place] [facility];  Service: Cardiovascular;  Laterality: N/A;  . A/V SHUNTOGRAM Left 07/12/2020   Procedure: A/V SHUNTOGRAM;  Surgeon: Serafina Mitchell, MD;  Location: [place] [facility];  Service: Cardiovascular;  Laterality: Left;  . ANGIOPLASTY Left 09/24/2019   Procedure: Angioplasty Innominate Vien;  Surgeon: Serafina Mitchell, MD;  Location: [place];  Service: Vascular;  Laterality: Left;  . AV FISTULA PLACEMENT Left 01/13/2013   Procedure: INSERTION OF ARTERIOVENOUS (AV) GORE-TEX GRAFT ARM;  Surgeon: Angelia Mould, MD;  Location: [place];  Service: Vascular;  Laterality: Left;  . AV FISTULA  PLACEMENT Left 05/05/2013   Procedure: INSERTION OF LEFT UPPER ARM  ARTERIOVENOUS GORTEX GRAFT;  Surgeon: Angelia Mould, MD;  Location: [facility];  Service: Vascular;  Laterality: Left;  . AV FISTULA PLACEMENT Left 11/26/2019   Procedure: INSERTION OF ARTERIOVENOUS (AV) GORE-TEX GRAFT, left  ARM;  Surgeon: Serafina Mitchell, MD;  Location: [place];  Service: Vascular;  Laterality: Left;  . [place] REMOVAL Left 03/26/2013   Procedure: REMOVAL OF  NON INCORPORATED ARTERIOVENOUS GORETEX GRAFT ([place]) left arm * repair of  left brachial artery with vein patch angioplasty.;  Surgeon: Angelia Mould, MD;  Location: [place];  Service: Vascular;  Laterality: Left;  . CARDIAC CATHETERIZATION  August 26, 2012  . CATARACT EXTRACTION W/ INTRAOCULAR LENS  IMPLANT, BILATERAL    . COLONOSCOPY    . CORONARY ARTERY BYPASS GRAFT N/A 09/13/2017   Procedure: CORONARY ARTERY BYPASS GRAFTING (CABG) times four LIMA  to LAD, SVG sequentially to OM and RAMUS Intermediate and SVG to Acute  Marginal;  Surgeon: Melrose Nakayama, MD;  Location: [place];  Service: Open Heart Surgery;  Laterality: N/A;  . EMBOLECTOMY Right 08/27/2013   Procedure: EMBOLECTOMY;  Surgeon: Mal Misty, MD;  Location: [place];  Service: Vascular;  Laterality: Right;  Thrombectomy of Radial and ulnar artery.  . ENDOVASCULAR STENT INSERTION Left 09/24/2019   Procedure: Endovascular Stent Graft Insertion, Arm Graft;  Surgeon: Serafina Mitchell, MD;  Location: [place];  Service: Vascular;  Laterality: Left;  . EYE SURGERY     laser.  and surgery for DM  . fistula     RUE and wrist  . FISTULOGRAM Left 09/24/2019   Procedure: SHUNTOGRAM OF ARTERIOVENOUS FISTULA LEFT ARM,;  Surgeon: Serafina Mitchell, MD;  Location: [place];  Service: Vascular;  Laterality: Left;  . INSERTION OF DIALYSIS CATHETER Right 01/01/2013   Procedure: INSERTION OF DIALYSIS CATHETER right  internal jugular;  Surgeon: Serafina Mitchell, MD;  Location: [place];  Service: Vascular;   Laterality: Right;  . INSERTION OF DIALYSIS CATHETER N/A 03/26/2013   Procedure: INSERTION OF DIALYSIS CATHETER Left internal jugular vein;  Surgeon: Angelia Mould, MD;  Location: [place];  Service: Vascular;  Laterality: N/A;  . INSERTION OF DIALYSIS CATHETER Right 11/26/2019   Procedure: INSERTION OF DIALYSIS CATHETER, right internal jugular;  Surgeon: Serafina Mitchell, MD;  Location: [place];  Service: Vascular;  Laterality: Right;  . IR THORACENTESIS ASP PLEURAL SPACE W/IMG GUIDE  09/30/2019  . LEFT HEART CATH AND CORONARY ANGIOGRAPHY N/A 09/09/2017   Procedure: LEFT HEART CATH AND CORONARY ANGIOGRAPHY;  Surgeon: Troy Sine, MD;  Location: [place] [facility];  Service: Cardiovascular;  Laterality: N/A;  . LIGATION OF ARTERIOVENOUS  FISTULA Right 12/30/2013   Procedure: REMOVAL OF SEGMENT OF GORTEX GRAFT AND FISTULA  AND REPAIR OF BRACHIAL ARTERY;  Surgeon: Mal Misty, MD;  Location: [place];  Service: Vascular;  Laterality: Right;  . MITRAL VALVE REPAIR N/A 09/13/2017   Procedure: MITRAL VALVE REPAIR (MVR);  Surgeon: Melrose Nakayama, MD;  Location: [place];  Service: Open Heart Surgery;  Laterality: N/A;  . MITRAL VALVE REPAIR (MV)/CORONARY ARTERY BYPASS GRAFTING (CABG)  09/13/2017  . PENILE PROSTHESIS IMPLANT     1997.. no card  . PERIPHERAL VASCULAR BALLOON ANGIOPLASTY Left 10/04/2017   Procedure: PERIPHERAL VASCULAR BALLOON ANGIOPLASTY;  Surgeon: Elam Dutch, MD;  Location: [place] [facility];  Service: Cardiovascular;  Laterality: Left;  AVF  . PERIPHERAL VASCULAR BALLOON ANGIOPLASTY Left 07/12/2020   Procedure: PERIPHERAL VASCULAR BALLOON ANGIOPLASTY;  Surgeon: Serafina Mitchell, MD;  Location: [place] [facility];  Service: Cardiovascular;  Laterality: Left;  ARM SHUNTOGRAM  . REVISION OF ARTERIOVENOUS GORETEX GRAFT Left 09/24/2019   Procedure: REVISION OF ARTERIOVENOUS GORETEX GRAFT LEFT ARM;  Surgeon: Serafina Mitchell, MD;  Location: [place];  Service: Vascular;   Laterality: Left;  . TEE WITHOUT CARDIOVERSION N/A 06/11/2013   Procedure: TRANSESOPHAGEAL ECHOCARDIOGRAM (TEE);  Surgeon: Larey Dresser, MD;  Location: [facility] ENDOSCOPY;  Service: Cardiovascular;  Laterality: N/A;  . TEE WITHOUT CARDIOVERSION N/A 09/13/2017   Procedure: TRANSESOPHAGEAL ECHOCARDIOGRAM (TEE);  Surgeon: Melrose Nakayama, MD;  Location: [place];  Service: Open Heart Surgery;  Laterality: N/A;  . THROMBECTOMY W/ EMBOLECTOMY Left 03/26/2013   Procedure: Attempted thrombectomy of left arm arteriovenous goretex graft.;  Surgeon: Angelia Mould, MD;  Location: [place];  Service: Vascular;  Laterality: Left;  . ULTRASOUND GUIDANCE FOR VASCULAR ACCESS Bilateral 09/24/2019  Procedure: Ultrasound Guidance For Vascular Access, Right Femoral Vein and Left Arm Graft;  Surgeon: Serafina Mitchell, MD;  Location: [place];  Service: Vascular;  Laterality: Bilateral;  . VENOGRAM N/A 04/07/2013   Procedure: VENOGRAM;  Surgeon: Serafina Mitchell, MD;  Location: [facility] CATH LAB;  Service: Cardiovascular;  Laterality: N/A;    Prior to Admission medications   Medication Sig Start Date End Date Taking? Authorizing Provider  acetaminophen (TYLENOL) 500 MG tablet Take 500-1,000 mg by mouth every 6 (six) hours as needed (for pain/toothache).    [provider]  allopurinol (ZYLOPRIM) 100 MG tablet Take 100 mg by mouth daily. Lf 08/22/20 90 day supply 05/30/20   [provider]  amiodarone (PACERONE) 200 MG tablet Take 200 mg by mouth daily. 08/22/20   [provider]  aspirin EC 81 MG tablet Take 1 tablet (81 mg total) by mouth daily. 11/29/15   Rivet, Sindy [place]adeloupe, MD  atorvastatin (LIPITOR) 40 MG tablet Take 40 mg by mouth daily.     [provider]  B Complex-C-Folic Acid (RENA-VITE RX) 1 MG TABS Take 1 tablet by mouth daily. 07/10/19   [provider]  BD PEN NEEDLE NANO U/F 32G X 4 MM MISC Inject into the skin as directed. 06/19/20   [provider]  calcium  acetate (PHOSLO) 667 MG capsule Take 1,334 mg by mouth 3 (three) times daily with meals.     [provider]  cinacalcet (SENSIPAR) 60 MG tablet Take 60 mg by mouth daily.    [provider]  cyproheptadine (PERIACTIN) 2 MG/5ML syrup Take 2 mg by mouth 3 (three) times daily with meals.     [provider]  Darbepoetin Alfa (ARANESP) 100 MCG/0.5ML SOSY injection Inject 100 mcg into the skin every 7 (seven) days. Administered with dialysis    [provider]  diclofenac Sodium (VOLTAREN) 1 % GEL Apply 1 application topically 4 (four) times daily as needed (pain).     [provider]  doxercalciferol (HECTOROL) 4 MCG/2ML injection Inject 2 mcg into the vein every Monday, Wednesday, and Friday with hemodialysis. Administered at dialysis    [provider]  doxercalciferol (HECTOROL) 4 MCG/2ML injection Doxercalciferol (Hectorol) 01/04/20 01/02/21  [provider]  ferric gluconate 125 mg in sodium chloride 0.9 % 100 mL Inject 250 mg into the vein every Monday, Wednesday, and Friday with hemodialysis.     [provider]  insulin detemir (LEVEMIR) 100 UNIT/ML injection Inject 0.2 mLs (20 Units total) into the skin 2 (two) times daily. Patient taking differently: Inject 20 Units into the skin daily. 10/07/17   Kasyn Giovanni, [name]  iron sucrose (VENOFER) 20 MG/ML injection Iron Sucrose (Venofer) 12/02/19 11/30/20  [provider]  levETIRAcetam (KEPPRA) 500 MG tablet Take 2 tablets (1,000 mg total) by mouth daily. Then take extra 500 mg (1 tablet) on MWF after HD Patient taking differently: Take 500 mg by mouth See admin instructions. Take 1 tablet (500 mg) by mouth twice daily on Sundays, Tuesdays, Thursdays, & Saturdays. (Morning & At supper) Take 1 tablet (500 mg) by mouth 3 times daily on Mondays, Wednesday, & Fridays (Morning, After dialysis, & at supper) 01/20/19   Pieter Partridge, DO  linaclotide (LINZESS) 145 MCG CAPS capsule  Take 145 mcg by mouth daily as needed (constipation).     [provider]  Methoxy PEG-Epoetin Beta (MIRCERA IJ) Mircera 11/27/19 04/26/21  [provider]  metoprolol tartrate (LOPRESSOR) 50 MG tablet Take  1 tablet (50 mg total) by mouth 2 (two) times daily. 03/11/18 11/19/20  Daune Perch, NP  mirtazapine (REMERON) 7.5 MG tablet Take 7.5 mg by mouth at bedtime.     [provider]  Nutritional Supplements (FEEDING SUPPLEMENT, NEPRO CARB STEADY,) LIQD Take 237 mLs by mouth 2 (two) times daily between meals. Patient taking differently: Take 237 mLs by mouth daily as needed (nutritional supplement). 10/07/17   Gold, Wilder Glade, [name]  Polyethyl Glycol-Propyl Glycol 0.4-0.3 % SOLN Place 1 drop into both eyes 3 (three) times daily as needed (dry/irritated eyes.).    [provider]  sertraline (ZOLOFT) 25 MG tablet Take 25 mg by mouth at bedtime.    [provider]  SUPER B COMPLEX/C PO Take 1 capsule by mouth daily.    [provider]    Allergies Patient has no known allergies.  Family History  Problem Relation Age of Onset  . Heart disease Father   . Diabetes Sister   . Diabetes Brother   . Diabetes Son     Social History Social History   Tobacco Use  . Smoking status: Former Smoker    Years: 7.00    Quit date: 06/10/1973    Years since quitting: 47.5  . Smokeless tobacco: Never Used  . Tobacco comment: Quit in 1974.  Vaping Use  . Vaping Use: Never used  Substance Use Topics  . Alcohol use: No    Alcohol/week: 0.0 standard drinks  . Drug use: No      Review of Systems Constitutional: No fever/chills Eyes: No visual changes. ENT: No sore throat. Cardiovascular: No chest pain Respiratory: Positive for SOB Gastrointestinal: No abdominal pain.  No nausea, no vomiting.  No diarrhea.  No constipation. Genitourinary: Negative for dysuria. Musculoskeletal: Negative for back pain. Skin: Negative for rash. Neurological:  Negative for headaches, focal weakness or numbness. All other ROS negative ____________________________________________   PHYSICAL EXAM:  VITAL SIGNS: ED Triage Vitals [12/16/20 1241]  Enc Vitals Group     BP      Pulse      Resp      Temp      Temp src      SpO2      Weight 185 lb 10 oz (84.2 kg)     Height '5\' 10"'$  (1.778 m)     Head Circumference      Peak Flow      Pain Score 0     Pain Loc      Pain Edu?      Excl. in [place]?     Constitutional: Alert and oriented. Well appearing and in no acute distress. Eyes: Conjunctivae are normal. EOMI. Head: Atraumatic. Nose: No congestion/rhinnorhea. Mouth/Throat: Mucous membranes are moist.   Neck: No stridor. Trachea Midline. FROM Cardiovascular: Normal rate, regular rhythm. Grossly normal heart sounds.  Good peripheral circulation. Respiratory: Decreased breath sounds on the left, no increased work of breathing, normal oxygen levels Gastrointestinal: Soft and nontender. No distention. No abdominal bruits.  Musculoskeletal: No lower extremity tenderness nor edema.  No joint effusions. Neurologic: Baseline aphasia with a little bit of weakness in the left but not significant.  Grip strength bilaterally and able to lift both legs up off the bed. Skin:  Skin is warm, dry and intact. No rash noted. Psychiatric: Mood and affect are normal. Speech and behavior are normal. GU: Deferred   ____________________________________________   LABS (all labs ordered are listed, but only abnormal results are displayed)  Labs  Reviewed  CBC WITH DIFFERENTIAL/PLATELET - Abnormal; Notable for the following components:      Result Value   RBC 3.96 (*)    Hemoglobin 12.1 (*)    Abs Immature Granulocytes 0.10 (*)    All other components within normal limits  COMPREHENSIVE METABOLIC PANEL - Abnormal; Notable for the following components:   Glucose, Bld 125 (*)    Creatinine, Ser 3.22 (*)    Albumin 3.2 (*)    GFR, Estimated 20 (*)    All other  components within normal limits  PROTIME-INR  APTT   ____________________________________________   ED ECG REPORT I, Vanessa Pike, the attending physician, personally viewed and interpreted this ECG.  Normal sinus rate of 73, no ST elevation, T wave inversion in aVL, normal intervals, left anterior fascicular block ____________________________________________  RADIOLOGY I, Vanessa [place], personally viewed and evaluated these images (plain radiographs) as part of my medical decision making, as well as reviewing the written report by the radiologist.  ED MD interpretation: Pleural effusion of the left  Official radiology report(s): DG Chest 2 View  Result Date: 12/16/2020 CLINICAL DATA:  Pleural effusion EXAM: CHEST - 2 VIEW COMPARISON:  09/30/2019 FINDINGS: Large left pleural effusion similar in size to the prior study. Collapse of most of the left lung with aerated left upper lobe. Right lung clear. Prior mitral valve replacement. No right effusion. Vascularity normal. 2 cm calcification to the left of the trachea appears to be within the thyroid on prior CT. IMPRESSION: Large left pleural effusion unchanged with collapse of most of the left lung, unchanged from prior study. Negative for heart failure. Electronically Signed   By: Franchot Gallo M.D.   On: 12/16/2020 13:39    ____________________________________________   PROCEDURES  Procedure(s) performed (including Critical Care):  Procedures   ____________________________________________   INITIAL IMPRESSION / ASSESSMENT AND PLAN / ED COURSE   Ernest [name] was evaluated in Emergency Department on 12/16/2020 for the symptoms described in the history of present illness. He was evaluated in the context of the global COVID-19 pandemic, which necessitated consideration that the patient might be at risk for infection with the SARS-CoV-2 virus that causes COVID-19. Institutional protocols and algorithms that pertain to the  evaluation of patients at risk for COVID-19 are in a state of rapid change based on information released by regulatory bodies including the CDC and federal and state organizations. These policies and algorithms were followed during the patient's care in the ED.     Patient presents with asymptomatic pleural effusion sent over from his nursing home.  Patient denies any shortness of breath and is satting 100% with no increased work of breathing.  Will suspicion for ACS but EKG was ordered.  Basic labs were ordered to evaluate for Electra abnormalities, anemia  Labs are reassuring  Discussed with Dr. Pascal Lux from IR who reviewed patient's chart.  Patient had a thoracentesis done in December 15 and states that the postthoracentesis chest x-ray looks very similar to the x-ray that he has today.  They could try to drain him if he was having new respiratory symptoms or her oxygen levels were low versus continue to watch it if no new symptoms.   Patient denies any no new symptoms and his oxygen levels are normal at this time I think would be more risk than benefit in doing a thoracentesis.  I did call patient's daughter Ernest [name] to update her and she expressed understanding.  At this time we will  discharge patient back to facility         ____________________________________________   FINAL CLINICAL IMPRESSION(S) / ED DIAGNOSES   Final diagnoses:  Chronic pleural effusion     MEDICATIONS GIVEN DURING THIS VISIT:  Medications - No data to display   ED Discharge Orders    None       Note:  This document was prepared using Dragon voice recognition software and may include unintentional dictation errors.   Vanessa [place], MD 12/16/20 1425

## 2020-12-16 NOTE — ED Notes (Signed)
This RN gave report to white [employer]

## 2020-12-16 NOTE — ED Notes (Signed)
Lab contacted to send tech to collect labs if unable to use tubes sent

## 2020-12-16 NOTE — Discharge Instructions (Signed)
Discussed with Dr. Pascal Lux from IR who reviewed patient's chart.  Patient had a thoracentesis done in December 15 and states that the postthoracentesis chest x-ray looks very similar to the x-ray that he has today.  It is very loculated and there would be little fluid to drain off of this.  He would only need drainage if he develops shortness of breath or worsening respiratory symptoms therefore we decided not to drain it today given he is asymptomatic with normal oxygen levels.

## 2021-01-20 ENCOUNTER — Other Ambulatory Visit: Payer: Self-pay | Admitting: Nephrology

## 2021-02-17 ENCOUNTER — Other Ambulatory Visit: Payer: Self-pay

## 2021-02-17 ENCOUNTER — Emergency Department
Admission: EM | Admit: 2021-02-17 | Discharge: 2021-02-17 | Disposition: A | Discharge status: Home / Self Care | Payer: Medicare (Managed Care) | Attending: Emergency Medicine | Admitting: Emergency Medicine

## 2021-02-17 DIAGNOSIS — N186 End stage renal disease: Secondary | ICD-10-CM | POA: Diagnosis not present

## 2021-02-17 DIAGNOSIS — E1122 Type 2 diabetes mellitus with diabetic chronic kidney disease: Secondary | ICD-10-CM | POA: Diagnosis not present

## 2021-02-17 DIAGNOSIS — Z992 Dependence on renal dialysis: Secondary | ICD-10-CM | POA: Insufficient documentation

## 2021-02-17 DIAGNOSIS — Z79899 Other long term (current) drug therapy: Secondary | ICD-10-CM | POA: Insufficient documentation

## 2021-02-17 DIAGNOSIS — Z7982 Long term (current) use of aspirin: Secondary | ICD-10-CM | POA: Insufficient documentation

## 2021-02-17 DIAGNOSIS — I132 Hypertensive heart and chronic kidney disease with heart failure and with stage 5 chronic kidney disease, or end stage renal disease: Secondary | ICD-10-CM | POA: Diagnosis not present

## 2021-02-17 DIAGNOSIS — F0150 Vascular dementia without behavioral disturbance: Secondary | ICD-10-CM | POA: Diagnosis not present

## 2021-02-17 DIAGNOSIS — T82590A Other mechanical complication of surgically created arteriovenous fistula, initial encounter: Secondary | ICD-10-CM | POA: Insufficient documentation

## 2021-02-17 DIAGNOSIS — Z20822 Contact with and (suspected) exposure to covid-19: Secondary | ICD-10-CM | POA: Diagnosis not present

## 2021-02-17 DIAGNOSIS — E1140 Type 2 diabetes mellitus with diabetic neuropathy, unspecified: Secondary | ICD-10-CM | POA: Diagnosis not present

## 2021-02-17 DIAGNOSIS — J449 Chronic obstructive pulmonary disease, unspecified: Secondary | ICD-10-CM | POA: Diagnosis not present

## 2021-02-17 DIAGNOSIS — E11319 Type 2 diabetes mellitus with unspecified diabetic retinopathy without macular edema: Secondary | ICD-10-CM | POA: Diagnosis not present

## 2021-02-17 DIAGNOSIS — Z87891 Personal history of nicotine dependence: Secondary | ICD-10-CM | POA: Insufficient documentation

## 2021-02-17 DIAGNOSIS — Z951 Presence of aortocoronary bypass graft: Secondary | ICD-10-CM | POA: Insufficient documentation

## 2021-02-17 DIAGNOSIS — Z794 Long term (current) use of insulin: Secondary | ICD-10-CM | POA: Diagnosis not present

## 2021-02-17 DIAGNOSIS — I5043 Acute on chronic combined systolic (congestive) and diastolic (congestive) heart failure: Secondary | ICD-10-CM | POA: Insufficient documentation

## 2021-02-17 DIAGNOSIS — I2510 Atherosclerotic heart disease of native coronary artery without angina pectoris: Secondary | ICD-10-CM | POA: Insufficient documentation

## 2021-02-17 DIAGNOSIS — Z789 Other specified health status: Secondary | ICD-10-CM

## 2021-02-17 LAB — BASIC METABOLIC PANEL
Anion gap: 13 (ref 5–15)
BUN: 53 mg/dL — ABNORMAL HIGH (ref 8–23)
CO2: 29 mmol/L (ref 22–32)
Calcium: 9.3 mg/dL (ref 8.9–10.3)
Chloride: 96 mmol/L — ABNORMAL LOW (ref 98–111)
Creatinine, Ser: 6.75 mg/dL — ABNORMAL HIGH (ref 0.61–1.24)
GFR, Estimated: 8 mL/min — ABNORMAL LOW (ref >60)
Glucose, Bld: 143 mg/dL — ABNORMAL HIGH (ref 70–99)
Potassium: 4 mmol/L (ref 3.5–5.1)
Sodium: 138 mmol/L (ref 135–145)

## 2021-02-17 LAB — CBC WITH DIFFERENTIAL/PLATELET
Abs Immature Granulocytes: 0.05 10*3/uL (ref 0.00–0.07)
Basophils Absolute: 0.1 10*3/uL (ref 0.0–0.1)
Basophils Relative: 1 %
Eosinophils Absolute: 0.3 10*3/uL (ref 0.0–0.5)
Eosinophils Relative: 3 %
HCT: 37.7 % — ABNORMAL LOW (ref 39.0–52.0)
Hemoglobin: 12.1 g/dL — ABNORMAL LOW (ref 13.0–17.0)
Immature Granulocytes: 1 %
Lymphocytes Relative: 20 %
Lymphs Abs: 1.8 10*3/uL (ref 0.7–4.0)
MCH: 29.5 pg (ref 26.0–34.0)
MCHC: 32.1 g/dL (ref 30.0–36.0)
MCV: 92 fL (ref 80.0–100.0)
Monocytes Absolute: 0.9 10*3/uL (ref 0.1–1.0)
Monocytes Relative: 10 %
Neutro Abs: 5.8 10*3/uL (ref 1.7–7.7)
Neutrophils Relative %: 65 %
Platelets: 209 10*3/uL (ref 150–400)
RBC: 4.1 MIL/uL — ABNORMAL LOW (ref 4.22–5.81)
RDW: 14.6 % (ref 11.5–15.5)
WBC: 8.9 10*3/uL (ref 4.0–10.5)
nRBC: 0 % (ref 0.0–0.2)

## 2021-02-17 LAB — RESP PANEL BY RT-PCR (FLU A&B, COVID) ARPGX2
Influenza A by PCR: NEGATIVE
Influenza B by PCR: NEGATIVE
SARS Coronavirus 2 by RT PCR: NEGATIVE

## 2021-02-17 MED ORDER — PATIROMER SORBITEX CALCIUM 8.4 G PO PACK
8.4000 g | PACK | Freq: Once | ORAL | Status: AC
  Administered 2021-02-17: 8.4 g via ORAL
  Filled 2021-02-17: qty 1

## 2021-02-17 MED ORDER — VELTASSA 8.4 G PO PACK: 8.4000 g | PACK | Freq: Every day | ORAL | 0 refills | Status: AC

## 2021-02-17 NOTE — ED Provider Notes (Signed)
[facility] Emergency Department Provider Note   ____________________________________________   Event Date/Time   First MD Initiated Contact with Patient 02/17/21 1532     (approximate)  I have reviewed the triage vital signs and the nursing notes.   HISTORY  Chief Complaint Vascular Access Problem    HPI Ernest [name] is a 75 y.o. male with past medical history of hypertension, hyperlipidemia, diabetes, COPD, CHF, stroke with left-sided deficits, seizures, and ESRD on HD (MWF) who presents to the ED complaining of fistula malfunction.  History is limited due to patient's slurred speech and vascular dementia at baseline.  Per EMS, patient went to his usually scheduled dialysis earlier today but staff at the dialysis center had difficulty performing dialysis and there was concerned that his fistula was clotted.  He denies any symptoms at this time, states he was last dialyzed 2 days ago and has been feeling well since then.  He specifically denies any fevers, cough, chest pain, or shortness of breath.  He denies any pain in his left upper extremity, where he has the AV fistula.  He has an old fistula in his right upper extremity that is nonfunctioning.        Past Medical History:  Diagnosis Date  . Abnormal stress test    s/p cath November 2013 with modest disease involving the ostial left main, proximal LAD, proximal RCA - do not appear to be hemodynamically signficant; mild LV dysfunction  . Anemia    low iron  . Anxiety   . Arthritis   . Bacteremia   . Chronic systolic CHF (congestive heart failure) ([name])   . COPD (chronic obstructive pulmonary disease) ([place])   . Coronary artery disease    a. per cath report 2013, b. 09/13/2017-CABG X4 & Mitral valve repair  . Depression   . DVT (deep venous thrombosis) (HCC)    arm - right- finished blood thinner- not sure when it  . Ejection fraction < 50%   . Erectile dysfunction    penile implant  . ESRD  (end stage renal disease) ([place])    [facility] on a MWF schedule.     . Gout    Hx: of  . Hepatitis C   . History of seizures    last one 2018  . Hyperlipidemia   . Hypertension   . Mitral valve disease    s/p Mitral repair 09/13/17  . Obesity   . Retinopathy   . Shortness of breath    Hx: of with exertion  . Stroke [facility])    "several" left side weakness, speech affected  . Type II or unspecified type diabetes mellitus without mention of complication, not stated as uncontrolled    adult onset    Patient Active Problem List   Diagnosis Date Noted  . Pleural effusion on left 09/30/2019  . Acute respiratory failure with hypoxia ([place]) 09/30/2019  . Pleural effusion 09/30/2019  . Chest pain 12/02/2018  . Vascular dementia ([place]) 10/10/2017  . Paroxysmal atrial flutter ([place]) 10/10/2017  . Altered mental status, unspecified 10/09/2017  . Other malaise 10/09/2017  . S/P CABG x 4 10/04/2017  . Cerebral embolism with cerebral infarction 09/20/2017  . NSVT (nonsustained ventricular tachycardia) ([place])   . Dyspnea 09/08/2017  . Non-ST elevation MI (NSTEMI) ([place])   . Acute on chronic combined systolic and diastolic heart failure ([place])   . End-stage renal disease on hemodialysis ([place])   . Left leg weakness   . TIA (  transient ischemic attack) 10/27/2016  . Dependence on renal dialysis ([place]) 08/09/2015  . History of CVA with residual deficit 08/07/2015  . Seizure, late effect of stroke ([place]) 08/07/2015  . Diabetic peripheral neuropathy ([place]) 08/07/2015  . Unspecified protein-calorie malnutrition ([place]) 05/27/2015  . Coagulation defect, unspecified ([place]) 07/16/2014  . Type 2 diabetes mellitus with diabetic peripheral angiopathy without gangrene ([place]) 07/01/2014  . Pruritus, unspecified 05/26/2014  . Elevated prostate specific antigen (PSA) 08/03/2013  . Encounter for immunization 07/10/2013  . Obstructive sleep apnea 06/16/2013  . Hyperlipidemia associated with type 2 diabetes mellitus  ([place])   . ESRD (end stage renal disease) ([place])   . Erectile dysfunction associated with type 2 diabetes mellitus ([place])   . Hypertension due to end stage renal disease caused by type 2 diabetes mellitus, on dialysis ([place])   . COPD (chronic obstructive pulmonary disease) ([place])   . Coronary artery disease   . Chronic combined systolic and diastolic CHF (congestive heart failure) ([place])   . Anemia in chronic kidney disease 12/27/2011  . Long term (current) use of insulin ([place]) 11/03/2009  . Gout, unspecified 02/28/2009  . Iron deficiency anemia, unspecified 10/30/2008  . Secondary hyperparathyroidism of renal origin ([place]) 10/30/2008  . Uncontrolled type 2 diabetes mellitus with end-stage renal disease ([place]) 06/08/1984    Past Surgical History:  Procedure Laterality Date  . A/V FISTULAGRAM N/A 10/04/2017   Procedure: A/V FISTULAGRAM;  Surgeon: Elam Dutch, MD;  Location: [place] [facility];  Service: Cardiovascular;  Laterality: N/A;  . A/V SHUNTOGRAM Left 07/12/2020   Procedure: A/V SHUNTOGRAM;  Surgeon: Serafina Mitchell, MD;  Location: [place] [facility];  Service: Cardiovascular;  Laterality: Left;  . ANGIOPLASTY Left 09/24/2019   Procedure: Angioplasty Innominate Vien;  Surgeon: Serafina Mitchell, MD;  Location: [place];  Service: Vascular;  Laterality: Left;  . AV FISTULA PLACEMENT Left 01/13/2013   Procedure: INSERTION OF ARTERIOVENOUS (AV) GORE-TEX GRAFT ARM;  Surgeon: Angelia Mould, MD;  Location: [place];  Service: Vascular;  Laterality: Left;  . AV FISTULA PLACEMENT Left 05/05/2013   Procedure: INSERTION OF LEFT UPPER ARM  ARTERIOVENOUS GORTEX GRAFT;  Surgeon: Angelia Mould, MD;  [facility] OR;  Service: Vascular;  Laterality: Left;  . AV FISTULA PLACEMENT Left 11/26/2019   Procedure: INSERTION OF ARTERIOVENOUS (AV) GORE-TEX GRAFT, left  ARM;  Surgeon: Serafina Mitchell, MD;  Location: [place];  Service: Vascular;  Laterality: Left;  . [place] REMOVAL Left 03/26/2013    Procedure: REMOVAL OF  NON INCORPORATED ARTERIOVENOUS GORETEX GRAFT ([place]) left arm * repair of  left brachial artery with vein patch angioplasty.;  Surgeon: Angelia Mould, MD;  Location: [place];  Service: Vascular;  Laterality: Left;  . CARDIAC CATHETERIZATION  August 26, 2012  . CATARACT EXTRACTION W/ INTRAOCULAR LENS  IMPLANT, BILATERAL    . COLONOSCOPY    . CORONARY ARTERY BYPASS GRAFT N/A 09/13/2017   Procedure: CORONARY ARTERY BYPASS GRAFTING (CABG) times four LIMA  to LAD, SVG sequentially to OM and RAMUS Intermediate and SVG to Acute Marginal;  Surgeon: Melrose Nakayama, MD;  Location: [place];  Service: Open Heart Surgery;  Laterality: N/A;  . EMBOLECTOMY Right 08/27/2013   Procedure: EMBOLECTOMY;  Surgeon: Mal Misty, MD;  Location: [place];  Service: Vascular;  Laterality: Right;  Thrombectomy of Radial and ulnar artery.  . ENDOVASCULAR STENT INSERTION Left 09/24/2019   Procedure: Endovascular Stent Graft Insertion, Arm Graft;  Surgeon: Serafina Mitchell, MD;  Location: [facility] OR;  Service: Vascular;  Laterality: Left;  . EYE SURGERY     laser.  and surgery for DM  . fistula     RUE and wrist  . FISTULOGRAM Left 09/24/2019   Procedure: SHUNTOGRAM OF ARTERIOVENOUS FISTULA LEFT ARM,;  Surgeon: Serafina Mitchell, MD;  Location: [place];  Service: Vascular;  Laterality: Left;  . INSERTION OF DIALYSIS CATHETER Right 01/01/2013   Procedure: INSERTION OF DIALYSIS CATHETER right  internal jugular;  Surgeon: Serafina Mitchell, MD;  Location: [place];  Service: Vascular;  Laterality: Right;  . INSERTION OF DIALYSIS CATHETER N/A 03/26/2013   Procedure: INSERTION OF DIALYSIS CATHETER Left internal jugular vein;  Surgeon: Angelia Mould, MD;  Location: [place];  Service: Vascular;  Laterality: N/A;  . INSERTION OF DIALYSIS CATHETER Right 11/26/2019   Procedure: INSERTION OF DIALYSIS CATHETER, right internal jugular;  Surgeon: Serafina Mitchell, MD;  Location: [place];  Service: Vascular;   Laterality: Right;  . IR THORACENTESIS ASP PLEURAL SPACE W/IMG GUIDE  09/30/2019  . LEFT HEART CATH AND CORONARY ANGIOGRAPHY N/A 09/09/2017   Procedure: LEFT HEART CATH AND CORONARY ANGIOGRAPHY;  Surgeon: [place] Sine, MD;  Location: [place] CV [facility];  Service: Cardiovascular;  Laterality: N/A;  . LIGATION OF ARTERIOVENOUS  FISTULA Right 12/30/2013   Procedure: REMOVAL OF SEGMENT OF GORTEX GRAFT AND FISTULA  AND REPAIR OF BRACHIAL ARTERY;  Surgeon: Mal Misty, MD;  Location: [place];  Service: Vascular;  Laterality: Right;  . MITRAL VALVE REPAIR N/A 09/13/2017   Procedure: MITRAL VALVE REPAIR (MVR);  Surgeon: Melrose Nakayama, MD;  Location: [place];  Service: Open Heart Surgery;  Laterality: N/A;  . MITRAL VALVE REPAIR (MV)/CORONARY ARTERY BYPASS GRAFTING (CABG)  09/13/2017  . PENILE PROSTHESIS IMPLANT     1997.. no card  . PERIPHERAL VASCULAR BALLOON ANGIOPLASTY Left 10/04/2017   Procedure: PERIPHERAL VASCULAR BALLOON ANGIOPLASTY;  Surgeon: Elam Dutch, MD;  Location: [place] CV [facility];  Service: Cardiovascular;  Laterality: Left;  AVF  . PERIPHERAL VASCULAR BALLOON ANGIOPLASTY Left 07/12/2020   Procedure: PERIPHERAL VASCULAR BALLOON ANGIOPLASTY;  Surgeon: Serafina Mitchell, MD;  Location: [place] [facility];  Service: Cardiovascular;  Laterality: Left;  ARM SHUNTOGRAM  . REVISION OF ARTERIOVENOUS GORETEX GRAFT Left 09/24/2019   Procedure: REVISION OF ARTERIOVENOUS GORETEX GRAFT LEFT ARM;  Surgeon: Serafina Mitchell, MD;  Location: [place];  Service: Vascular;  Laterality: Left;  . TEE WITHOUT CARDIOVERSION N/A 06/11/2013   Procedure: TRANSESOPHAGEAL ECHOCARDIOGRAM (TEE);  Surgeon: Larey Dresser, MD;  Location[facility] ENDOSCOPY;  Service: Cardiovascular;  Laterality: N/A;  . TEE WITHOUT CARDIOVERSION N/A 09/13/2017   Procedure: TRANSESOPHAGEAL ECHOCARDIOGRAM (TEE);  Surgeon: Melrose Nakayama, MD;  Location: [place];  Service: Open Heart Surgery;  Laterality: N/A;  . THROMBECTOMY W/  EMBOLECTOMY Left 03/26/2013   Procedure: Attempted thrombectomy of left arm arteriovenous goretex graft.;  Surgeon: Angelia Mould, MD;  Location: [place];  Service: Vascular;  Laterality: Left;  . ULTRASOUND GUIDANCE FOR VASCULAR ACCESS Bilateral 09/24/2019   Procedure: Ultrasound Guidance For Vascular Access, Right Femoral Vein and Left Arm Graft;  Surgeon: Serafina Mitchell, MD;  Location: [facility] OR;  Service: Vascular;  Laterality: Bilateral;  . VENOGRAM N/A 04/07/2013   Procedure: VENOGRAM;  Surgeon: Serafina Mitchell, MD;  Location: [facility] CATH LAB;  Service: Cardiovascular;  Laterality: N/A;    Prior to Admission medications   Medication Sig Start Date End Date Taking? Authorizing Provider  patiromer (VELTASSA) 8.4 g packet Take 1 packet (8.4 g total) by mouth daily for 3 days. 02/17/21 02/20/21 Yes Blake Divine, MD  acetaminophen (TYLENOL) 500 MG tablet Take 1,000 mg by mouth every 6 (six) hours as needed for mild pain or moderate pain.    [provider]  allopurinol (ZYLOPRIM) 100 MG tablet Take 100 mg by mouth daily.    [provider]  aspirin EC 81 MG tablet Take 1 tablet (81 mg total) by mouth daily. 11/29/15   Rivet, Sindy Guadeloupe, MD  atorvastatin (LIPITOR) 40 MG tablet Take 40 mg by mouth at bedtime.    [provider]  cinacalcet (SENSIPAR) 30 MG tablet Take 30 mg by mouth every Monday, Wednesday, and Friday.    [provider]  cyproheptadine (PERIACTIN) 4 MG tablet Take 2 mg by mouth 3 (three) times daily with meals.     [provider]  docusate sodium (COLACE) 100 MG capsule Take 100 mg by mouth daily.    [provider]  ferric citrate (AURYXIA) 1 GM 210 MG(Fe) tablet Take 210 mg by mouth daily. (with snack)    [provider]  ferric citrate (AURYXIA) 1 GM 210 MG(Fe) tablet Take 420 mg by mouth 3 (three) times daily with meals.    [provider]  insulin detemir (LEVEMIR) 100 UNIT/ML injection Inject 0.2 mLs (20  Units total) into the skin 2 (two) times daily. Patient taking differently: Inject 20 Units into the skin at bedtime. 10/07/17   Gold, Wayne E, [name]  levETIRAcetam (KEPPRA) 500 MG tablet Take 500 mg by mouth See admin instructions. Take 1 tablet ('500mg'$ ) by mouth twice daily every Tuesday, Thursday, Saturday and Sunday    [provider]  levETIRAcetam (KEPPRA) 500 MG tablet Take 500 mg by mouth See admin instructions. Take 1 tablet ('500mg'$ ) by mouth three times daily on Monday, Wednesday and Friday    [provider]  linaclotide (LINZESS) 145 MCG CAPS capsule Take 145 mcg by mouth daily as needed (constipation).     [provider]  mirtazapine (REMERON) 7.5 MG tablet Take 7.5 mg by mouth at bedtime.     [provider]  Nutritional Supplements (FEEDING SUPPLEMENT, NEPRO CARB STEADY,) LIQD Take 237 mLs by mouth 2 (two) times daily between meals. Patient taking differently: Take 237 mLs by mouth daily as needed (nutritional supplement). 10/07/17   Gold, Wilder [place]ade, [name]  Polyethyl Glycol-Propyl Glycol 0.4-0.3 % SOLN Place 1 drop into both eyes 3 (three) times daily as needed (dry/irritated eyes.).    [provider]  polyethylene glycol (MIRALAX / GLYCOLAX) 17 g packet Take 17 g by mouth daily.    [provider]  senna-docusate (SENOKOT-S) 8.6-50 MG tablet Take 1 tablet by mouth at bedtime.    [provider]  sertraline (ZOLOFT) 25 MG tablet Take 25 mg by mouth at bedtime.    [provider]    Allergies Patient has no known allergies.  Family History  Problem Relation Age of Onset  . Heart disease Father   . Diabetes Sister   . Diabetes Brother   . Diabetes Son     Social History Social History   Tobacco Use  . Smoking status: Former Smoker    Years: 7.00    Quit date: 06/10/1973    Years since quitting: 47.7  . Smokeless tobacco: Never Used  . Tobacco comment: Quit in 1974.  Vaping Use  . Vaping Use: Never used   Substance  Use Topics  . Alcohol use: No    Alcohol/week: 0.0 standard drinks  . Drug use: No    Review of Systems  Constitutional: No fever/chills Eyes: No visual changes. ENT: No sore throat. Cardiovascular: Denies chest pain.  Positive for vascular access malfunction. Respiratory: Denies shortness of breath. Gastrointestinal: No abdominal pain.  No nausea, no vomiting.  No diarrhea.  No constipation. Genitourinary: Negative for dysuria. Musculoskeletal: Negative for back pain. Skin: Negative for rash. Neurological: Negative for headaches, focal weakness or numbness.  ____________________________________________   PHYSICAL EXAM:  VITAL SIGNS: ED Triage Vitals  Enc Vitals Group     BP      Pulse      Resp      Temp      Temp src      SpO2      Weight      Height      Head Circumference      Peak Flow      Pain Score      Pain Loc      Pain Edu?      Excl. in [place]?     Constitutional: Alert and oriented. Eyes: Conjunctivae are normal. Head: Atraumatic. Nose: No congestion/rhinnorhea. Mouth/Throat: Mucous membranes are moist. Neck: Normal ROM Cardiovascular: Normal rate, regular rhythm. Grossly normal heart sounds.  Left upper extremity AV fistula with no palpable thrill.  Right upper extremity AV fistula also without palpable thrill.  2+ radial pulses bilaterally. Respiratory: Normal respiratory effort.  No retractions. Lungs CTAB. Gastrointestinal: Soft and nontender. No distention. Genitourinary: deferred Musculoskeletal: No lower extremity tenderness nor edema. Neurologic:  Normal speech and language.  Chronic left-sided weakness noted. Skin:  Skin is warm, dry and intact. No rash noted. Psychiatric: Mood and affect are normal. Speech and behavior are normal.  ____________________________________________   LABS (all labs ordered are listed, but only abnormal results are displayed)  Labs Reviewed  CBC WITH DIFFERENTIAL/PLATELET - Abnormal; Notable for  the following components:      Result Value   RBC 4.10 (*)    Hemoglobin 12.1 (*)    HCT 37.7 (*)    All other components within normal limits  BASIC METABOLIC PANEL - Abnormal; Notable for the following components:   Chloride 96 (*)    Glucose, Bld 143 (*)    BUN 53 (*)    Creatinine, Ser 6.75 (*)    GFR, Estimated 8 (*)    All other components within normal limits  RESP PANEL BY RT-PCR (FLU A&B, COVID) ARPGX2   ____________________________________________  EKG  ED ECG REPORT I, Blake Divine, the attending physician, personally viewed and interpreted this ECG.   Date: 02/17/2021  EKG Time: 16:08  Rate: 66  Rhythm: normal sinus rhythm  Axis: Normal  Intervals:none  ST&T Change: None   PROCEDURES  Procedure(s) performed (including Critical Care):  Procedures   ____________________________________________   INITIAL IMPRESSION / ASSESSMENT AND PLAN / ED COURSE       76 year old male with past medical history of hypertension, hyperlipidemia, diabetes, COPD, CHF, stroke with left-sided deficits, seizures, and ESRD on HD (MWF) who presents to the ED with difficulty using his left upper extremity AV fistula earlier today.  Patient denies any complaints at this time and is not in any respiratory distress, does not appear acutely fluid overloaded.  We will screen labs to check his potassium as well as other electrolytes.  Plan to discuss with either nephrology or vascular surgery to arrange for future  dialysis.  Labs are reassuring, potassium within normal limits.  Case discussed with Dr. Lucky Cowboy of vascular surgery, who will assist with arrangements to have fistula further assessed on Monday.  Case discussed with Dr. Juleen China of nephrology, who recommends giving patient once daily Veltassa until he is able to follow-up on Monday.  Patient is appropriate for discharge back to nursing facility for now with plan to follow-up on Monday for further assessment of his nonfunctioning  fistula and arrangements for dialysis.      ____________________________________________   FINAL CLINICAL IMPRESSION(S) / ED DIAGNOSES  Final diagnoses:  Problem with vascular access     ED Discharge Orders         Ordered    patiromer (VELTASSA) 8.4 g packet  Daily        02/17/21 2005           Note:  This document was prepared using Dragon voice recognition software and may include unintentional dictation errors.   Blake Divine, MD 02/17/21 2008

## 2021-02-17 NOTE — ED Notes (Signed)
[facility] called for transport.

## 2021-02-17 NOTE — ED Triage Notes (Signed)
Pt presents to the [facility] via EMS from [facility] with c/o clot to dialysis fistula. Pt receives dialysis MWF and presented to dialysis clinic this morning. Dialysis staff were unable to get fistula to flush. Pt received dialysis on Wednesday without any complications. Pt currently A&O with stable vitals.

## 2021-02-17 NOTE — Discharge Instructions (Signed)
Your AV fistula is not currently functioning.  You will need to follow-up with vascular surgery on Monday for further assessment and potential removal of clot.  In the meantime, you will need to take Veltassa to ensure that your potassium does not rise.  A prescription for this medication was provided.  If you have any worsening symptoms, please return to the ER for reevaluation.

## 2021-02-17 NOTE — ED Notes (Signed)
Report attempted to be called several times to [facility] but no answer from facility.

## 2021-02-28 ENCOUNTER — Encounter
Admission: EM | Disposition: A | Discharge status: Home / Self Care | Payer: Self-pay | Source: Home / Self Care | Attending: Emergency Medicine

## 2021-02-28 ENCOUNTER — Emergency Department: Payer: Medicare (Managed Care)

## 2021-02-28 ENCOUNTER — Emergency Department
Admission: EM | Admit: 2021-02-28 | Discharge: 2021-02-28 | Disposition: A | Discharge status: Home / Self Care | Payer: Medicare (Managed Care) | Attending: Emergency Medicine | Admitting: Emergency Medicine

## 2021-02-28 ENCOUNTER — Other Ambulatory Visit: Payer: Self-pay

## 2021-02-28 ENCOUNTER — Other Ambulatory Visit (INDEPENDENT_AMBULATORY_CARE_PROVIDER_SITE_OTHER): Payer: Self-pay | Admitting: Vascular Surgery

## 2021-02-28 DIAGNOSIS — Z992 Dependence on renal dialysis: Secondary | ICD-10-CM | POA: Insufficient documentation

## 2021-02-28 DIAGNOSIS — Z794 Long term (current) use of insulin: Secondary | ICD-10-CM | POA: Diagnosis not present

## 2021-02-28 DIAGNOSIS — T82518A Breakdown (mechanical) of other cardiac and vascular devices and implants, initial encounter: Secondary | ICD-10-CM

## 2021-02-28 DIAGNOSIS — U071 COVID-19: Secondary | ICD-10-CM | POA: Insufficient documentation

## 2021-02-28 DIAGNOSIS — E1122 Type 2 diabetes mellitus with diabetic chronic kidney disease: Secondary | ICD-10-CM | POA: Insufficient documentation

## 2021-02-28 DIAGNOSIS — Z951 Presence of aortocoronary bypass graft: Secondary | ICD-10-CM | POA: Insufficient documentation

## 2021-02-28 DIAGNOSIS — Y832 Surgical operation with anastomosis, bypass or graft as the cause of abnormal reaction of the patient, or of later complication, without mention of misadventure at the time of the procedure: Secondary | ICD-10-CM | POA: Insufficient documentation

## 2021-02-28 DIAGNOSIS — I132 Hypertensive heart and chronic kidney disease with heart failure and with stage 5 chronic kidney disease, or end stage renal disease: Secondary | ICD-10-CM | POA: Insufficient documentation

## 2021-02-28 DIAGNOSIS — Z87891 Personal history of nicotine dependence: Secondary | ICD-10-CM | POA: Diagnosis not present

## 2021-02-28 DIAGNOSIS — Z955 Presence of coronary angioplasty implant and graft: Secondary | ICD-10-CM | POA: Diagnosis not present

## 2021-02-28 DIAGNOSIS — D631 Anemia in chronic kidney disease: Secondary | ICD-10-CM | POA: Insufficient documentation

## 2021-02-28 DIAGNOSIS — J449 Chronic obstructive pulmonary disease, unspecified: Secondary | ICD-10-CM | POA: Diagnosis not present

## 2021-02-28 DIAGNOSIS — I2510 Atherosclerotic heart disease of native coronary artery without angina pectoris: Secondary | ICD-10-CM | POA: Insufficient documentation

## 2021-02-28 DIAGNOSIS — I5022 Chronic systolic (congestive) heart failure: Secondary | ICD-10-CM | POA: Diagnosis not present

## 2021-02-28 DIAGNOSIS — Z79899 Other long term (current) drug therapy: Secondary | ICD-10-CM | POA: Insufficient documentation

## 2021-02-28 DIAGNOSIS — T8241XA Breakdown (mechanical) of vascular dialysis catheter, initial encounter: Secondary | ICD-10-CM | POA: Insufficient documentation

## 2021-02-28 DIAGNOSIS — Z7982 Long term (current) use of aspirin: Secondary | ICD-10-CM | POA: Diagnosis not present

## 2021-02-28 DIAGNOSIS — N186 End stage renal disease: Secondary | ICD-10-CM | POA: Insufficient documentation

## 2021-02-28 DIAGNOSIS — Z95820 Peripheral vascular angioplasty status with implants and grafts: Secondary | ICD-10-CM | POA: Insufficient documentation

## 2021-02-28 DIAGNOSIS — T82868A Thrombosis of vascular prosthetic devices, implants and grafts, initial encounter: Secondary | ICD-10-CM | POA: Insufficient documentation

## 2021-02-28 DIAGNOSIS — N185 Chronic kidney disease, stage 5: Secondary | ICD-10-CM

## 2021-02-28 HISTORY — PX: DIALYSIS/PERMA CATHETER INSERTION: CATH118288

## 2021-02-28 LAB — CBC WITH DIFFERENTIAL/PLATELET
Abs Immature Granulocytes: 0.02 10*3/uL (ref 0.00–0.07)
Basophils Absolute: 0 10*3/uL (ref 0.0–0.1)
Basophils Relative: 1 %
Eosinophils Absolute: 0.1 10*3/uL (ref 0.0–0.5)
Eosinophils Relative: 2 %
HCT: 37.4 % — ABNORMAL LOW (ref 39.0–52.0)
Hemoglobin: 12.3 g/dL — ABNORMAL LOW (ref 13.0–17.0)
Immature Granulocytes: 0 %
Lymphocytes Relative: 24 %
Lymphs Abs: 1.3 10*3/uL (ref 0.7–4.0)
MCH: 29.7 pg (ref 26.0–34.0)
MCHC: 32.9 g/dL (ref 30.0–36.0)
MCV: 90.3 fL (ref 80.0–100.0)
Monocytes Absolute: 0.5 10*3/uL (ref 0.1–1.0)
Monocytes Relative: 9 %
Neutro Abs: 3.3 10*3/uL (ref 1.7–7.7)
Neutrophils Relative %: 64 %
Platelets: 197 10*3/uL (ref 150–400)
RBC: 4.14 MIL/uL — ABNORMAL LOW (ref 4.22–5.81)
RDW: 14.7 % (ref 11.5–15.5)
WBC: 5.2 10*3/uL (ref 4.0–10.5)
nRBC: 0 % (ref 0.0–0.2)

## 2021-02-28 LAB — BASIC METABOLIC PANEL
Anion gap: 12 (ref 5–15)
BUN: 81 mg/dL — ABNORMAL HIGH (ref 8–23)
CO2: 29 mmol/L (ref 22–32)
Calcium: 9 mg/dL (ref 8.9–10.3)
Chloride: 100 mmol/L (ref 98–111)
Creatinine, Ser: 9.89 mg/dL — ABNORMAL HIGH (ref 0.61–1.24)
GFR, Estimated: 5 mL/min — ABNORMAL LOW (ref >60)
Glucose, Bld: 256 mg/dL — ABNORMAL HIGH (ref 70–99)
Potassium: 4.7 mmol/L (ref 3.5–5.1)
Sodium: 141 mmol/L (ref 135–145)

## 2021-02-28 LAB — RESP PANEL BY RT-PCR (FLU A&B, COVID) ARPGX2
Influenza A by PCR: NEGATIVE
Influenza B by PCR: NEGATIVE
SARS Coronavirus 2 by RT PCR: POSITIVE — AB

## 2021-02-28 LAB — CBG MONITORING, ED: Glucose-Capillary: 99 mg/dL (ref 70–99)

## 2021-02-28 LAB — GLUCOSE, CAPILLARY: Glucose-Capillary: 81 mg/dL (ref 70–99)

## 2021-02-28 SURGERY — DIALYSIS/PERMA CATHETER INSERTION
Anesthesia: Moderate Sedation

## 2021-02-28 MED ORDER — SODIUM CHLORIDE 0.9 % IV SOLN
INTRAVENOUS | Status: AC
  Filled 2021-02-28: qty 10

## 2021-02-28 MED ORDER — HEPARIN SODIUM (PORCINE) 10000 UNIT/ML IJ SOLN
INTRAMUSCULAR | Status: AC
  Filled 2021-02-28: qty 1

## 2021-02-28 MED ORDER — ONDANSETRON HCL 4 MG/2ML IJ SOLN: 4.0000 mg | Freq: Four times a day (QID) | INTRAMUSCULAR | Status: DC | PRN

## 2021-02-28 MED ORDER — SODIUM CHLORIDE 0.9 % IV SOLN: 1.0000 g | INTRAVENOUS | Status: DC

## 2021-02-28 MED ORDER — FENTANYL CITRATE (PF) 100 MCG/2ML IJ SOLN
INTRAMUSCULAR | Status: DC | PRN
  Administered 2021-02-28: 50 ug via INTRAVENOUS

## 2021-02-28 MED ORDER — FENTANYL CITRATE (PF) 100 MCG/2ML IJ SOLN
INTRAMUSCULAR | Status: AC
  Filled 2021-02-28: qty 2

## 2021-02-28 MED ORDER — METHYLPREDNISOLONE SODIUM SUCC 125 MG IJ SOLR: 125.0000 mg | Freq: Once | INTRAMUSCULAR | Status: DC | PRN

## 2021-02-28 MED ORDER — FAMOTIDINE 20 MG PO TABS: 40.0000 mg | ORAL_TABLET | Freq: Once | ORAL | Status: DC | PRN

## 2021-02-28 MED ORDER — MIDAZOLAM HCL 2 MG/2ML IJ SOLN
INTRAMUSCULAR | Status: DC | PRN
  Administered 2021-02-28: 2 mg via INTRAVENOUS

## 2021-02-28 MED ORDER — MIDAZOLAM HCL 2 MG/2ML IJ SOLN
INTRAMUSCULAR | Status: AC
  Filled 2021-02-28: qty 2

## 2021-02-28 MED ORDER — DIPHENHYDRAMINE HCL 50 MG/ML IJ SOLN: 50.0000 mg | Freq: Once | INTRAMUSCULAR | Status: DC | PRN

## 2021-02-28 MED ORDER — HYDROMORPHONE HCL 1 MG/ML IJ SOLN
1.0000 mg | Freq: Once | INTRAMUSCULAR | Status: DC | PRN
Start: 2021-02-28 — End: 2021-03-01

## 2021-02-28 MED ORDER — MIDAZOLAM HCL 2 MG/ML PO SYRP: 8.0000 mg | ORAL_SOLUTION | Freq: Once | ORAL | Status: DC | PRN

## 2021-02-28 MED ORDER — SODIUM CHLORIDE 0.9 % IV SOLN: INTRAVENOUS | Status: DC

## 2021-02-28 SURGICAL SUPPLY — 8 items
BIOPATCH RED 1 DISK 7.0 (GAUZE/BANDAGES/DRESSINGS) ×2 IMPLANT
CATH PALINDROME RT-P 15FX55CM (CATHETERS) ×2 IMPLANT
CATH PALINDROME-P 44CM KIT (CATHETERS) ×1
KIT CATH CHRNC PALINDROME PRCS (CATHETERS) ×1 IMPLANT
PACK ANGIOGRAPHY (CUSTOM PROCEDURE TRAY) ×2 IMPLANT
SUT MNCRL AB 4-0 PS2 18 (SUTURE) ×2 IMPLANT
SUT SILK 0 FSL (SUTURE) ×2 IMPLANT
WIRE AMPLATZ SSTIFF .035X260CM (WIRE) ×2 IMPLANT

## 2021-02-28 NOTE — ED Notes (Signed)
Critical Lab  Covid Positive   MD. Cherylann Banas advised

## 2021-02-28 NOTE — ED Notes (Signed)
    House address where pt lives ,   [address]  [place] [address]

## 2021-02-28 NOTE — Interval H&P Note (Signed)
History and Physical Interval Note:  02/28/2021 5:46 PM  Ernest [name]  has presented today for surgery, with the diagnosis of ESRD.  The various methods of treatment have been discussed with the patient and family. After consideration of risks, benefits and other options for treatment, the patient has consented to  Procedure(s): DIALYSIS/PERMA CATHETER EXCHANGE (N/A) as a surgical intervention.  The patient's history has been reviewed, patient examined, no change in status, stable for surgery.  I have reviewed the patient's chart and labs.  Questions were answered to the patient's satisfaction.     Hortencia Pilar

## 2021-02-28 NOTE — ED Notes (Signed)
MD. Cherylann Banas at bedside ,   Advised an IV was not needed , should be having a procedure done today with specials

## 2021-02-28 NOTE — ED Notes (Signed)
Report given to RN at [facility]

## 2021-02-28 NOTE — ED Provider Notes (Signed)
[facility] Emergency Department Provider Note ____________________________________________   Event Date/Time   First MD Initiated Contact with Patient 02/28/21 [phone]     (approximate)  I have reviewed the triage vital signs and the nursing notes.   HISTORY  Chief Complaint Clogged Port    HPI AVRON MAYO is a 75 y.o. male with PMH as noted below including ESRD on dialysis who presents for a clogged dialysis catheter.  The patient states that he last had dialysis on Friday and typically gets it on MWF although he missed it yesterday.  Today when he came for dialysis, his catheter in the right leg was clogged.  The patient denies any acute symptoms.  He has no shortness of breath, weakness, dizziness, or other acute symptoms.  He tested positive for COVID on Friday.   Past Medical History:  Diagnosis Date  . Abnormal stress test    s/p cath November 2013 with modest disease involving the ostial left main, proximal LAD, proximal RCA - do not appear to be hemodynamically signficant; mild LV dysfunction  . Anemia    low iron  . Anxiety   . Arthritis   . Bacteremia   . Chronic systolic CHF (congestive heart failure) ([place])   . COPD (chronic obstructive pulmonary disease) ([place])   . Coronary artery disease    a. per cath report 2013, b. 09/13/2017-CABG X4 & Mitral valve repair  . Depression   . DVT (deep venous thrombosis) (HCC)    arm - right- finished blood thinner- not sure when it  . Ejection fraction < 50%   . Erectile dysfunction    penile implant  . ESRD (end stage renal disease) ([place])    [facility] on a MWF schedule.     . Gout    Hx: of  . Hepatitis C   . History of seizures    last one 2018  . Hyperlipidemia   . Hypertension   . Mitral valve disease    s/p Mitral repair 09/13/17  . Obesity   . Retinopathy   . Shortness of breath    Hx: of with exertion  . Stroke [facility])    "several" left side weakness, speech affected  . Type II or  unspecified type diabetes mellitus without mention of complication, not stated as uncontrolled    adult onset    Patient Active Problem List   Diagnosis Date Noted  . Pleural effusion on left 09/30/2019  . Acute respiratory failure with hypoxia ([place]) 09/30/2019  . Pleural effusion 09/30/2019  . Chest pain 12/02/2018  . Vascular dementia ([place]) 10/10/2017  . Paroxysmal atrial flutter ([place]) 10/10/2017  . Altered mental status, unspecified 10/09/2017  . Other malaise 10/09/2017  . S/P CABG x 4 10/04/2017  . Cerebral embolism with cerebral infarction 09/20/2017  . NSVT (nonsustained ventricular tachycardia) ([place])   . Dyspnea 09/08/2017  . Non-ST elevation MI (NSTEMI) ([place])   . Acute on chronic combined systolic and diastolic heart failure ([place])   . End-stage renal disease on hemodialysis ([place])   . Left leg weakness   . TIA (transient ischemic attack) 10/27/2016  . Dependence on renal dialysis ([place]) 08/09/2015  . History of CVA with residual deficit 08/07/2015  . Seizure, late effect of stroke ([place]) 08/07/2015  . Diabetic peripheral neuropathy ([place]) 08/07/2015  . Unspecified protein-calorie malnutrition ([place]) 05/27/2015  . Coagulation defect, unspecified ([place]) 07/16/2014  . Type 2 diabetes mellitus with diabetic peripheral angiopathy without gangrene ([place]) 07/01/2014  .  Pruritus, unspecified 05/26/2014  . Elevated prostate specific antigen (PSA) 08/03/2013  . Encounter for immunization 07/10/2013  . Obstructive sleep apnea 06/16/2013  . Hyperlipidemia associated with type 2 diabetes mellitus ([place])   . ESRD (end stage renal disease) ([place])   . Erectile dysfunction associated with type 2 diabetes mellitus ([place])   . Hypertension due to end stage renal disease caused by type 2 diabetes mellitus, on dialysis ([place])   . COPD (chronic obstructive pulmonary disease) ([place])   . Coronary artery disease   . Chronic combined systolic and diastolic CHF (congestive heart failure) ([place])   . Anemia  in chronic kidney disease 12/27/2011  . Long term (current) use of insulin ([place]) 11/03/2009  . Gout, unspecified 02/28/2009  . Iron deficiency anemia, unspecified 10/30/2008  . Secondary hyperparathyroidism of renal origin ([place]) 10/30/2008  . Uncontrolled type 2 diabetes mellitus with end-stage renal disease ([place]) 06/08/1984    Past Surgical History:  Procedure Laterality Date  . A/V FISTULAGRAM N/A 10/04/2017   Procedure: A/V FISTULAGRAM;  Surgeon: Elam Dutch, MD;  Location: [place] [facility];  Service: Cardiovascular;  Laterality: N/A;  . A/V SHUNTOGRAM Left 07/12/2020   Procedure: A/V SHUNTOGRAM;  Surgeon: Serafina Mitchell, MD;  Location: [place] [facility];  Service: Cardiovascular;  Laterality: Left;  . ANGIOPLASTY Left 09/24/2019   Procedure: Angioplasty Innominate Vien;  Surgeon: Serafina Mitchell, MD;  Location: [place];  Service: Vascular;  Laterality: Left;  . AV FISTULA PLACEMENT Left 01/13/2013   Procedure: INSERTION OF ARTERIOVENOUS (AV) GORE-TEX GRAFT ARM;  Surgeon: Angelia Mould, MD;  Location: [place];  Service: Vascular;  Laterality: Left;  . AV FISTULA PLACEMENT Left 05/05/2013   Procedure: INSERTION OF LEFT UPPER ARM  ARTERIOVENOUS GORTEX GRAFT;  Surgeon: Angelia Mould, MD;  Location[facility];  Service: Vascular;  Laterality: Left;  . AV FISTULA PLACEMENT Left 11/26/2019   Procedure: INSERTION OF ARTERIOVENOUS (AV) GORE-TEX GRAFT, left  ARM;  Surgeon: Serafina Mitchell, MD;  Location: [place];  Service: Vascular;  Laterality: Left;  . [place] REMOVAL Left 03/26/2013   Procedure: REMOVAL OF  NON INCORPORATED ARTERIOVENOUS GORETEX GRAFT ([place]) left arm * repair of  left brachial artery with vein patch angioplasty.;  Surgeon: Angelia Mould, MD;  Location: [place];  Service: Vascular;  Laterality: Left;  . CARDIAC CATHETERIZATION  August 26, 2012  . CATARACT EXTRACTION W/ INTRAOCULAR LENS  IMPLANT, BILATERAL    . COLONOSCOPY    . CORONARY ARTERY BYPASS GRAFT N/A  09/13/2017   Procedure: CORONARY ARTERY BYPASS GRAFTING (CABG) times four LIMA  to LAD, SVG sequentially to OM and RAMUS Intermediate and SVG to Acute Marginal;  Surgeon: Melrose Nakayama, MD;  Location: [place];  Service: Open Heart Surgery;  Laterality: N/A;  . EMBOLECTOMY Right 08/27/2013   Procedure: EMBOLECTOMY;  Surgeon: Mal Misty, MD;  Location: [place];  Service: Vascular;  Laterality: Right;  Thrombectomy of Radial and ulnar artery.  . ENDOVASCULAR STENT INSERTION Left 09/24/2019   Procedure: Endovascular Stent Graft Insertion, Arm Graft;  Surgeon: Serafina Mitchell, MD;  Location: [place];  Service: Vascular;  Laterality: Left;  . EYE SURGERY     laser.  and surgery for DM  . fistula     RUE and wrist  . FISTULOGRAM Left 09/24/2019   Procedure: SHUNTOGRAM OF ARTERIOVENOUS FISTULA LEFT ARM,;  Surgeon: Serafina Mitchell, MD;  Location: [place];  Service: Vascular;  Laterality: Left;  . INSERTION OF  DIALYSIS CATHETER Right 01/01/2013   Procedure: INSERTION OF DIALYSIS CATHETER right  internal jugular;  Surgeon: Serafina Mitchell, MD;  Location: [place];  Service: Vascular;  Laterality: Right;  . INSERTION OF DIALYSIS CATHETER N/A 03/26/2013   Procedure: INSERTION OF DIALYSIS CATHETER Left internal jugular vein;  Surgeon: Angelia Mould, MD;  Location: [place];  Service: Vascular;  Laterality: N/A;  . INSERTION OF DIALYSIS CATHETER Right 11/26/2019   Procedure: INSERTION OF DIALYSIS CATHETER, right internal jugular;  Surgeon: Serafina Mitchell, MD;  Location: [place];  Service: Vascular;  Laterality: Right;  . IR THORACENTESIS ASP PLEURAL SPACE W/IMG GUIDE  09/30/2019  . LEFT HEART CATH AND CORONARY ANGIOGRAPHY N/A 09/09/2017   Procedure: LEFT HEART CATH AND CORONARY ANGIOGRAPHY;  Surgeon: Troy Sine, MD;  Location: [place] [facility];  Service: Cardiovascular;  Laterality: N/A;  . LIGATION OF ARTERIOVENOUS  FISTULA Right 12/30/2013   Procedure: REMOVAL OF SEGMENT OF GORTEX GRAFT AND  FISTULA  AND REPAIR OF BRACHIAL ARTERY;  Surgeon: Mal Misty, MD;  Location: [place];  Service: Vascular;  Laterality: Right;  . MITRAL VALVE REPAIR N/A 09/13/2017   Procedure: MITRAL VALVE REPAIR (MVR);  Surgeon: Melrose Nakayama, MD;  Location: [place];  Service: Open Heart Surgery;  Laterality: N/A;  . MITRAL VALVE REPAIR (MV)/CORONARY ARTERY BYPASS GRAFTING (CABG)  09/13/2017  . PENILE PROSTHESIS IMPLANT     1997.. no card  . PERIPHERAL VASCULAR BALLOON ANGIOPLASTY Left 10/04/2017   Procedure: PERIPHERAL VASCULAR BALLOON ANGIOPLASTY;  Surgeon: Elam Dutch, MD;  Location: [place] [facility];  Service: Cardiovascular;  Laterality: Left;  AVF  . PERIPHERAL VASCULAR BALLOON ANGIOPLASTY Left 07/12/2020   Procedure: PERIPHERAL VASCULAR BALLOON ANGIOPLASTY;  Surgeon: Serafina Mitchell, MD;  Location: [place] [facility];  Service: Cardiovascular;  Laterality: Left;  ARM SHUNTOGRAM  . REVISION OF ARTERIOVENOUS GORETEX GRAFT Left 09/24/2019   Procedure: REVISION OF ARTERIOVENOUS GORETEX GRAFT LEFT ARM;  Surgeon: Serafina Mitchell, MD;  Location: [place];  Service: Vascular;  Laterality: Left;  . TEE WITHOUT CARDIOVERSION N/A 06/11/2013   Procedure: TRANSESOPHAGEAL ECHOCARDIOGRAM (TEE);  Surgeon: Larey Dresser, MD;  [facility] ENDOSCOPY;  Service: Cardiovascular;  Laterality: N/A;  . TEE WITHOUT CARDIOVERSION N/A 09/13/2017   Procedure: TRANSESOPHAGEAL ECHOCARDIOGRAM (TEE);  Surgeon: Melrose Nakayama, MD;  Location: [place];  Service: Open Heart Surgery;  Laterality: N/A;  . THROMBECTOMY W/ EMBOLECTOMY Left 03/26/2013   Procedure: Attempted thrombectomy of left arm arteriovenous goretex graft.;  Surgeon: Angelia Mould, MD;  Location: [place];  Service: Vascular;  Laterality: Left;  . ULTRASOUND GUIDANCE FOR VASCULAR ACCESS Bilateral 09/24/2019   Procedure: Ultrasound Guidance For Vascular Access, Right Femoral Vein and Left Arm Graft;  Surgeon: Serafina Mitchell, MD;  Location: [facility] OR;   Service: Vascular;  Laterality: Bilateral;  . VENOGRAM N/A 04/07/2013   Procedure: VENOGRAM;  Surgeon: Serafina Mitchell, MD;  Location: [facility] CATH LAB;  Service: Cardiovascular;  Laterality: N/A;    Prior to Admission medications   Medication Sig Start Date End Date Taking? Authorizing Provider  acetaminophen (TYLENOL) 500 MG tablet Take 1,000 mg by mouth every 6 (six) hours as needed for mild pain or moderate pain.    [provider]  allopurinol (ZYLOPRIM) 100 MG tablet Take 100 mg by mouth daily.    [provider]  aspirin EC 81 MG tablet Take 1 tablet (81 mg total) by mouth daily. 11/29/15   Rivet, Sindy [place],  MD  atorvastatin (LIPITOR) 40 MG tablet Take 40 mg by mouth at bedtime.    [provider]  cinacalcet (SENSIPAR) 30 MG tablet Take 30 mg by mouth every Monday, Wednesday, and Friday.    [provider]  cyproheptadine (PERIACTIN) 4 MG tablet Take 2 mg by mouth 3 (three) times daily with meals.     [provider]  docusate sodium (COLACE) 100 MG capsule Take 100 mg by mouth daily.    [provider]  ferric citrate (AURYXIA) 1 GM 210 MG(Fe) tablet Take 210 mg by mouth daily. (with snack)    [provider]  ferric citrate (AURYXIA) 1 GM 210 MG(Fe) tablet Take 420 mg by mouth 3 (three) times daily with meals.    [provider]  insulin detemir (LEVEMIR) 100 UNIT/ML injection Inject 0.2 mLs (20 Units total) into the skin 2 (two) times daily. Patient taking differently: Inject 20 Units into the skin at bedtime. 10/07/17   Gold, Wayne E, [name]  levETIRAcetam (KEPPRA) 500 MG tablet Take 500 mg by mouth See admin instructions. Take 1 tablet ('500mg'$ ) by mouth twice daily every Tuesday, Thursday, Saturday and Sunday    [provider]  levETIRAcetam (KEPPRA) 500 MG tablet Take 500 mg by mouth See admin instructions. Take 1 tablet ('500mg'$ ) by mouth three times daily on Monday, Wednesday and Friday    [provider]   linaclotide (LINZESS) 145 MCG CAPS capsule Take 145 mcg by mouth daily as needed (constipation).     [provider]  mirtazapine (REMERON) 7.5 MG tablet Take 7.5 mg by mouth at bedtime.     [provider]  Nutritional Supplements (FEEDING SUPPLEMENT, NEPRO CARB STEADY,) LIQD Take 237 mLs by mouth 2 (two) times daily between meals. Patient taking differently: Take 237 mLs by mouth daily as needed (nutritional supplement). 10/07/17   Gold, Wilder Glade, [name]  Polyethyl Glycol-Propyl Glycol 0.4-0.3 % SOLN Place 1 drop into both eyes 3 (three) times daily as needed (dry/irritated eyes.).    [provider]  polyethylene glycol (MIRALAX / GLYCOLAX) 17 g packet Take 17 g by mouth daily.    [provider]  senna-docusate (SENOKOT-S) 8.6-50 MG tablet Take 1 tablet by mouth at bedtime.    [provider]  sertraline (ZOLOFT) 25 MG tablet Take 25 mg by mouth at bedtime.    [provider]    Allergies Patient has no known allergies.  Family History  Problem Relation Age of Onset  . Heart disease Father   . Diabetes Sister   . Diabetes Brother   . Diabetes Son     Social History Social History   Tobacco Use  . Smoking status: Former Smoker    Years: 7.00    Quit date: 06/10/1973    Years since quitting: 47.7  . Smokeless tobacco: Never Used  . Tobacco comment: Quit in 1974.  Vaping Use  . Vaping Use: Never used  Substance Use Topics  . Alcohol use: No    Alcohol/week: 0.0 standard drinks  . Drug use: No    Review of Systems  Constitutional: No fever/chills. Eyes: No redness. ENT: No sore throat. Cardiovascular: Denies chest pain. Respiratory: Denies shortness of breath. Gastrointestinal: No vomiting or diarrhea.  Genitourinary: Negative for dysuria.  Musculoskeletal: Negative for back pain. Skin: Negative for rash. Neurological: Negative for headache.   ____________________________________________   PHYSICAL  EXAM:  VITAL SIGNS: ED Triage Vitals  Enc Vitals Group     BP  02/28/21 0859 (!) 151/76     Pulse Rate 02/28/21 0859 66     Resp 02/28/21 0859 18     Temp 02/28/21 0859 98 F (36.7 C)     Temp src --      SpO2 02/28/21 0859 97 %     Weight 02/28/21 0859 180 lb 12.4 oz (82 kg)     Height 02/28/21 0859 '5\' 9"'$  (1.753 m)     Head Circumference --      Peak Flow --      Pain Score 02/28/21 0856 0     Pain Loc --      Pain Edu? --      Excl. in [place]? --     Constitutional: Alert and oriented.  Relatively well appearing, in no acute distress. Eyes: Conjunctivae are normal.  Head: Atraumatic. Nose: No congestion/rhinnorhea. Mouth/Throat: Mucous membranes are moist.   Neck: Normal range of motion.  Cardiovascular: Normal rate, regular rhythm. Grossly normal heart sounds.  Good peripheral circulation. Respiratory: Normal respiratory effort.  No retractions. Lungs CTAB. Gastrointestinal: No distention.  Musculoskeletal: No lower extremity edema.  Extremities warm and well perfused.  Right lower extremity Vas-Cath in place. Neurologic:  Normal speech and language. No gross focal neurologic deficits are appreciated.  Skin:  Skin is warm and dry. No rash noted. Psychiatric: Mood and affect are normal. Speech and behavior are normal.  ____________________________________________   LABS (all labs ordered are listed, but only abnormal results are displayed)  Labs Reviewed  RESP PANEL BY RT-PCR (FLU A&B, COVID) ARPGX2 - Abnormal; Notable for the following components:      Result Value   SARS Coronavirus 2 by RT PCR POSITIVE (*)    All other components within normal limits  BASIC METABOLIC PANEL - Abnormal; Notable for the following components:   Glucose, Bld 256 (*)    BUN 81 (*)    Creatinine, Ser 9.89 (*)    GFR, Estimated 5 (*)    All other components within normal limits  CBC WITH DIFFERENTIAL/PLATELET - Abnormal; Notable for the following components:   RBC 4.14 (*)    Hemoglobin  12.3 (*)    HCT 37.4 (*)    All other components within normal limits   ____________________________________________  EKG  ED ECG REPORT I, Arta Silence, the attending physician, personally viewed and interpreted this ECG.  Date: 02/28/2021 EKG Time: 0857 Rate: 67 Rhythm: normal sinus rhythm QRS Axis: normal Intervals: LAFB ST/T Wave abnormalities: Nonspecific T wave abnormalities; no peaked T waves Narrative Interpretation: Nonspecific abnormalities with no evidence of acute ischemia  ____________________________________________  RADIOLOGY    ____________________________________________   PROCEDURES  Procedure(s) performed: No  Procedures  Critical Care performed: No ____________________________________________   INITIAL IMPRESSION / ASSESSMENT AND PLAN / ED COURSE  Pertinent labs & imaging results that were available during my care of the patient were reviewed by me and considered in my medical decision making (see chart for details).  75 year old male with a history of ESRD on dialysis and other PMH as noted above presents for a clogged dialysis catheter.  He tested positive for COVID 3 days ago but has no other complaints.  His last dialysis was 3 days ago.  I reviewed the past medical records in [place].  The patient was here on 5/6 due to a malfunction of his left upper extremity shunt.  Vascular surgery was consulted and it appears that in the meantime that shunt was removed and he had the catheter placed  in the right lower extremity.  Will obtain a chest x-ray and labs to evaluate electrolytes and need for urgent dialysis although the patient clinically does not appear fluid overloaded.  I will consult vascular surgery.  ----------------------------------------- 9:23 AM on 02/28/2021 -----------------------------------------  I consulted Dr. Lucky Cowboy from vascular surgery who advises that the patient will need a catheter exchange which hopefully can be done  today.  ----------------------------------------- 3:36 PM on 02/28/2021 -----------------------------------------  Chest x-ray does not show evidence of edema.  The lab work-up is reassuring.  Potassium is normal.  The patient does not require emergent dialysis.  Plan will be to go for the catheter exchange and then be discharged from there.  The patient will then be able to have dialysis tomorrow.  I have signed the patient out to the oncoming ED physician Dr. Jari Pigg.  ____________________________________________   FINAL CLINICAL IMPRESSION(S) / ED DIAGNOSES  Final diagnoses:  Vascular catheter dysfunction, initial encounter [facility])      NEW MEDICATIONS STARTED DURING THIS VISIT:  New Prescriptions   No medications on file     Note:  This document was prepared using Dragon voice recognition software and may include unintentional dictation errors.   Arta Silence, MD 02/28/21 1536

## 2021-02-28 NOTE — ED Notes (Signed)
ED Provider at bedside. 

## 2021-02-28 NOTE — Op Note (Signed)
OPERATIVE NOTE   PROCEDURE: 1. Insertion of tunneled dialysis catheter right femoral approach same venous access.  PRE-OPERATIVE DIAGNOSIS: Nonfunction of existing tunneled dialysis catheter, and stage renal disease requiring hemodialysis   POST-OPERATIVE DIAGNOSIS: Same SURGEON: Hortencia Pilar  ANESTHESIA: Conscious sedation was administered under my direct supervision by the interventional radiology RN.  IV Versed plus fentanyl were utilized. Continuous ECG, pulse oximetry and blood pressure was monitored throughout the entire procedure.  Conscious sedation was for a total of 7 minutes 2 seconds.  ESTIMATED BLOOD LOSS: Minimal cc  CONTRAST USED:  None  FLUOROSCOPY TIME: 0.3 minutes  INDICATIONS:   Ernest [name] is a 75 y.o.y.o. male who presents with poor flow and nonfunction of the tunneled dialysis catheter.  Adequate dialysis has not been possible.  DESCRIPTION: After obtaining full informed written consent, the patient was positioned supine. The right groin was prepped and draped in a sterile fashion. The cuff is localized and using blunt and sharp dissection it is freed from the surrounding adhesions.  The existing catheter is then transected proximal to the cuff.  The guidewire is advanced without difficulty under fluoroscopy.  Dilators are passed over the wire as needed and the tunneled dialysis catheter is fed into the central venous system without difficulty.  Under fluoroscopy the 44 cm tip to cuff catheter was positioned with the tip at the atrial caval junction.  Both lumens aspirate and flush easily. After verification of smooth contour with proper tip position under fluoroscopy the catheter is packed with 5000 units of heparin per lumen.  Catheter secured to the skin of the right thigh with 0 silk. A sterile dressing is applied with a Biopatch.  COMPLICATIONS: None  CONDITION: Good  Hortencia Pilar [facility] Office:  [phone]    02/28/2021,5:44 PM

## 2021-02-28 NOTE — Discharge Instructions (Addendum)
Your catheter was fixed.  Follow-up with dialysis as planned

## 2021-02-28 NOTE — ED Notes (Signed)
Surgical services Nurse at bedside

## 2021-02-28 NOTE — H&P (Signed)
[facility] Admission History & Physical  MRN : [id number]  Ernest [name] is a 75 y.o. (1946/10/02) male who presents with chief complaint of  Chief Complaint  Patient presents with  . Clogged Port   History of Present Illness:  I am asked to evaluate the patient by the dialysis center. The patient was sent here because they were unable to achieve adequate dialysis yesterday. Furthermore the Center states they were unable to aspirate either lumen of the catheter yesterday.  This problem has been getting worse for about one week. The patient is unaware of any other change. Patient denies pain or tenderness overlying the access.  There is no pain with dialysis.  Patient denies fevers or shaking chills while on dialysis.    Patient is COVID-positive  Vascular surgery was consulted by Dr. Cherylann Banas for possible PermCath exchange.  Current Facility-Administered Medications  Medication Dose Route Frequency Provider Last Rate Last Admin  . 0.9 %  sodium chloride infusion  250 mL Intravenous PRN Serafina Mitchell, MD       Current Outpatient Medications  Medication Sig Dispense Refill  . acetaminophen (TYLENOL) 500 MG tablet Take 1,000 mg by mouth every 6 (six) hours as needed for mild pain or moderate pain.    [name] allopurinol (ZYLOPRIM) 100 MG tablet Take 100 mg by mouth daily.    [name] aspirin EC 81 MG tablet Take 1 tablet (81 mg total) by mouth daily. 30 tablet 11  . atorvastatin (LIPITOR) 40 MG tablet Take 40 mg by mouth at bedtime.    . cinacalcet (SENSIPAR) 30 MG tablet Take 30 mg by mouth every Monday, Wednesday, and Friday.    . cyproheptadine (PERIACTIN) 4 MG tablet Take 2 mg by mouth 3 (three) times daily with meals.     . docusate sodium (COLACE) 100 MG capsule Take 100 mg by mouth daily.    . ferric citrate (AURYXIA) 1 GM 210 MG(Fe) tablet Take 210 mg by mouth daily. (with snack)    . ferric citrate (AURYXIA) 1 GM 210 MG(Fe) tablet Take 420 mg by mouth 3 (three)  times daily with meals.    . insulin detemir (LEVEMIR) 100 UNIT/ML injection Inject 0.2 mLs (20 Units total) into the skin 2 (two) times daily. (Patient taking differently: Inject 20 Units into the skin at bedtime.) 10 mL 11  . levETIRAcetam (KEPPRA) 500 MG tablet Take 500 mg by mouth See admin instructions. Take 1 tablet ('500mg'$ ) by mouth twice daily every Tuesday, Thursday, Saturday and Sunday    . levETIRAcetam (KEPPRA) 500 MG tablet Take 500 mg by mouth See admin instructions. Take 1 tablet ('500mg'$ ) by mouth three times daily on Monday, Wednesday and Friday    . linaclotide (LINZESS) 145 MCG CAPS capsule Take 145 mcg by mouth daily as needed (constipation).     . mirtazapine (REMERON) 7.5 MG tablet Take 7.5 mg by mouth at bedtime.     . Nutritional Supplements (FEEDING SUPPLEMENT, NEPRO CARB STEADY,) LIQD Take 237 mLs by mouth 2 (two) times daily between meals. (Patient taking differently: Take 237 mLs by mouth daily as needed (nutritional supplement).)  0  . Polyethyl Glycol-Propyl Glycol 0.4-0.3 % SOLN Place 1 drop into both eyes 3 (three) times daily as needed (dry/irritated eyes.).    [name] polyethylene glycol (MIRALAX / GLYCOLAX) 17 g packet Take 17 g by mouth daily.    [name] senna-docusate (SENOKOT-S) 8.6-50 MG tablet Take 1 tablet by mouth at bedtime.    [name]  sertraline (ZOLOFT) 25 MG tablet Take 25 mg by mouth at bedtime.     Past Surgical History:  Procedure Laterality Date  . A/V FISTULAGRAM N/A 10/04/2017   Procedure: A/V FISTULAGRAM;  Surgeon: Elam Dutch, MD;  Location: [facility];  Service: Cardiovascular;  Laterality: N/A;  . A/V SHUNTOGRAM Left 07/12/2020   Procedure: A/V SHUNTOGRAM;  Surgeon: Serafina Mitchell, MD;  Location: [facility];  Service: Cardiovascular;  Laterality: Left;  . ANGIOPLASTY Left 09/24/2019   Procedure: Angioplasty Innominate Vien;  Surgeon: Serafina Mitchell, MD;  Location: [facility];  Service: Vascular;  Laterality: Left;  . AV FISTULA PLACEMENT Left  01/13/2013   Procedure: INSERTION OF ARTERIOVENOUS (AV) GORE-TEX GRAFT ARM;  Surgeon: Angelia Mould, MD;  Location: [facility];  Service: Vascular;  Laterality: Left;  . AV FISTULA PLACEMENT Left 05/05/2013   Procedure: INSERTION OF LEFT UPPER ARM  ARTERIOVENOUS GORTEX GRAFT;  Surgeon: Angelia Mould, MD;  Location: [facility];  Service: Vascular;  Laterality: Left;  . AV FISTULA PLACEMENT Left 11/26/2019   Procedure: INSERTION OF ARTERIOVENOUS (AV) GORE-TEX GRAFT, left  ARM;  Surgeon: Serafina Mitchell, MD;  Location: [facility];  Service: Vascular;  Laterality: Left;  . [place] REMOVAL Left 03/26/2013   Procedure: REMOVAL OF  NON INCORPORATED ARTERIOVENOUS GORETEX GRAFT ([place]) left arm * repair of  left brachial artery with vein patch angioplasty.;  Surgeon: Angelia Mould, MD;  Location: [facility];  Service: Vascular;  Laterality: Left;  . CARDIAC CATHETERIZATION  August 26, 2012  . CATARACT EXTRACTION W/ INTRAOCULAR LENS  IMPLANT, BILATERAL    . COLONOSCOPY    . CORONARY ARTERY BYPASS GRAFT N/A 09/13/2017   Procedure: CORONARY ARTERY BYPASS GRAFTING (CABG) times four LIMA  to LAD, SVG sequentially to OM and RAMUS Intermediate and SVG to Acute Marginal;  Surgeon: Melrose Nakayama, MD;  Location: [facility];  Service: Open Heart Surgery;  Laterality: N/A;  . EMBOLECTOMY Right 08/27/2013   Procedure: EMBOLECTOMY;  Surgeon: Mal Misty, MD;  Location: [facility];  Service: Vascular;  Laterality: Right;  Thrombectomy of Radial and ulnar artery.  . ENDOVASCULAR STENT INSERTION Left 09/24/2019   Procedure: Endovascular Stent Graft Insertion, Arm Graft;  Surgeon: Serafina Mitchell, MD;  Location: [facility];  Service: Vascular;  Laterality: Left;  . EYE SURGERY     laser.  and surgery for DM  . fistula     RUE and wrist  . FISTULOGRAM Left 09/24/2019   Procedure: SHUNTOGRAM OF ARTERIOVENOUS FISTULA LEFT ARM,;  Surgeon: Serafina Mitchell, MD;  Location: [facility];  Service: Vascular;  Laterality: Left;  . INSERTION  OF DIALYSIS CATHETER Right 01/01/2013   Procedure: INSERTION OF DIALYSIS CATHETER right  internal jugular;  Surgeon: Serafina Mitchell, MD;  Location: [facility];  Service: Vascular;  Laterality: Right;  . INSERTION OF DIALYSIS CATHETER N/A 03/26/2013   Procedure: INSERTION OF DIALYSIS CATHETER Left internal jugular vein;  Surgeon: Angelia Mould, MD;  Location: [facility];  Service: Vascular;  Laterality: N/A;  . INSERTION OF DIALYSIS CATHETER Right 11/26/2019   Procedure: INSERTION OF DIALYSIS CATHETER, right internal jugular;  Surgeon: Serafina Mitchell, MD;  Location: [facility];  Service: Vascular;  Laterality: Right;  . IR THORACENTESIS ASP PLEURAL SPACE W/IMG GUIDE  09/30/2019  . LEFT HEART CATH AND CORONARY ANGIOGRAPHY N/A 09/09/2017   Procedure: LEFT HEART CATH AND CORONARY ANGIOGRAPHY;  Surgeon: Troy Sine, MD;  Location: [facility];  Service: Cardiovascular;  Laterality: N/A;  . LIGATION OF ARTERIOVENOUS  FISTULA Right 12/30/2013   Procedure: REMOVAL OF SEGMENT OF GORTEX GRAFT AND FISTULA  AND REPAIR OF BRACHIAL ARTERY;  Surgeon: Mal Misty, MD;  Location: [facility];  Service: Vascular;  Laterality: Right;  . MITRAL VALVE REPAIR N/A 09/13/2017   Procedure: MITRAL VALVE REPAIR (MVR);  Surgeon: Melrose Nakayama, MD;  Location: [facility];  Service: Open Heart Surgery;  Laterality: N/A;  . MITRAL VALVE REPAIR (MV)/CORONARY ARTERY BYPASS GRAFTING (CABG)  09/13/2017  . PENILE PROSTHESIS IMPLANT     1997.. no card  . PERIPHERAL VASCULAR BALLOON ANGIOPLASTY Left 10/04/2017   Procedure: PERIPHERAL VASCULAR BALLOON ANGIOPLASTY;  Surgeon: Elam Dutch, MD;  Location: [facility];  Service: Cardiovascular;  Laterality: Left;  AVF  . PERIPHERAL VASCULAR BALLOON ANGIOPLASTY Left 07/12/2020   Procedure: PERIPHERAL VASCULAR BALLOON ANGIOPLASTY;  Surgeon: Serafina Mitchell, MD;  Location: [facility];  Service: Cardiovascular;  Laterality: Left;  ARM SHUNTOGRAM  . REVISION OF  ARTERIOVENOUS GORETEX GRAFT Left 09/24/2019   Procedure: REVISION OF ARTERIOVENOUS GORETEX GRAFT LEFT ARM;  Surgeon: Serafina Mitchell, MD;  Location: [facility];  Service: Vascular;  Laterality: Left;  . TEE WITHOUT CARDIOVERSION N/A 06/11/2013   Procedure: TRANSESOPHAGEAL ECHOCARDIOGRAM (TEE);  Surgeon: Larey Dresser, MD;  Location: [facility] ENDOSCOPY;  Service: Cardiovascular;  Laterality: N/A;  . TEE WITHOUT CARDIOVERSION N/A 09/13/2017   Procedure: TRANSESOPHAGEAL ECHOCARDIOGRAM (TEE);  Surgeon: Melrose Nakayama, MD;  Location: [facility];  Service: Open Heart Surgery;  Laterality: N/A;  . THROMBECTOMY W/ EMBOLECTOMY Left 03/26/2013   Procedure: Attempted thrombectomy of left arm arteriovenous goretex graft.;  Surgeon: Angelia Mould, MD;  Location: [facility];  Service: Vascular;  Laterality: Left;  . ULTRASOUND GUIDANCE FOR VASCULAR ACCESS Bilateral 09/24/2019   Procedure: Ultrasound Guidance For Vascular Access, Right Femoral Vein and Left Arm Graft;  Surgeon: Serafina Mitchell, MD;  Location: [facility] OR;  Service: Vascular;  Laterality: Bilateral;  . VENOGRAM N/A 04/07/2013   Procedure: VENOGRAM;  Surgeon: Serafina Mitchell, MD;  Location: [facility] CATH LAB;  Service: Cardiovascular;  Laterality: N/A;   Social History Social History   Tobacco Use  . Smoking status: Former Smoker    Years: 7.00    Quit date: 06/10/1973    Years since quitting: 47.7  . Smokeless tobacco: Never Used  . Tobacco comment: Quit in 1974.  Vaping Use  . Vaping Use: Never used  Substance Use Topics  . Alcohol use: No    Alcohol/week: 0.0 standard drinks  . Drug use: No   Family History Family History  Problem Relation Age of Onset  . Heart disease Father   . Diabetes Sister   . Diabetes Brother   . Diabetes Son   No family history of bleeding or clotting disorders, autoimmune disease or porphyria  [name]  REVIEW OF SYSTEMS (Negative unless checked)  Constitutional: '[]'$ Weight loss  '[]'$ Fever   '[]'$ Chills Cardiac: '[]'$ Chest pain   '[]'$ Chest pressure   '[]'$ Palpitations   '[]'$ Shortness of breath when laying flat   '[]'$ Shortness of breath at rest   '[x]'$ Shortness of breath with exertion. Vascular:  '[]'$ Pain in legs with walking   '[]'$ Pain in legs at rest   '[]'$ Pain in legs when laying flat   '[]'$ Claudication   '[]'$ Pain in feet when walking  '[]'$ Pain in feet at rest  '[]'$ Pain in feet when laying flat   '[]'$ History of DVT   '[]'$ Phlebitis   '[]'$   Swelling in legs   '[]'$ Varicose veins   '[]'$ Non-healing ulcers Pulmonary:   '[]'$ Uses home oxygen   '[]'$ Productive cough   '[]'$ Hemoptysis   '[]'$ Wheeze  '[]'$ COPD   '[]'$ Asthma Neurologic:  '[]'$ Dizziness  '[]'$ Blackouts   '[]'$ Seizures   '[]'$ History of stroke   '[]'$ History of TIA  '[]'$ Aphasia   '[]'$ Temporary blindness   '[]'$ Dysphagia   '[]'$ Weakness or numbness in arms   '[]'$ Weakness or numbness in legs Musculoskeletal:  '[]'$ Arthritis   '[]'$ Joint swelling   '[]'$ Joint pain   '[]'$ Low back pain Hematologic:  '[]'$ Easy bruising  '[]'$ Easy bleeding   '[]'$ Hypercoagulable state   '[x]'$ Anemic  '[]'$ Hepatitis Gastrointestinal:  '[]'$ Blood in stool   '[]'$ Vomiting blood  '[]'$ Gastroesophageal reflux/heartburn   '[]'$ Difficulty swallowing. Genitourinary:  '[x]'$ Chronic kidney disease   '[]'$ Difficult urination  '[]'$ Frequent urination  '[]'$ Burning with urination   '[]'$ Blood in urine Skin:  '[]'$ Rashes   '[]'$ Ulcers   '[]'$ Wounds Psychological:  '[]'$ History of anxiety   '[]'$  History of major depression.  Physical Examination   Vitals:   02/28/21 1100 02/28/21 1130 02/28/21 1200 02/28/21 1230  BP: (!) 163/87 (!) 155/119 (!) 162/83 (!) 175/87  Pulse: 65 65 65 63  Resp: '14 16 16 13  '$ Temp:      SpO2: 97% 96% 97% 95%  Weight:      Height:       Body mass index is 26.7 kg/m. Gen: WD/WN, NAD Head: [place]/AT, No temporalis wasting. Prominent temp pulse not noted. Ear/Nose/Throat: Hearing grossly intact, nares w/o erythema or drainage, oropharynx w/o Erythema/Exudate,  Eyes: Conjunctiva clear, sclera non-icteric Neck: Trachea midline.  No JVD.  Pulmonary:  Good air movement, respirations not  labored, no use of accessory muscles.  Cardiac: RRR, normal S1, S2. Vascular:  tunneled catheter without tenderness or drainage Vessel Right Left  Radial Palpable Palpable  Gastrointestinal: soft, non-tender/non-distended. No guarding/reflex.  Musculoskeletal: M/S 5/5 throughout.  Extremities without ischemic changes.  No deformity or atrophy.  Neurologic: Sensation grossly intact in extremities.  Symmetrical.  Speech is fluent. Motor exam as listed above. Psychiatric: Judgment intact, Mood & affect appropriate for pt's clinical situation. Dermatologic: No rashes or ulcers noted.  No cellulitis or open wounds. Lymph : No Cervical, Axillary, or Inguinal lymphadenopathy.  CBC Lab Results  Component Value Date   WBC 5.2 02/28/2021   HGB 12.3 (L) 02/28/2021   HCT 37.4 (L) 02/28/2021   MCV 90.3 02/28/2021   PLT 197 02/28/2021   BMET    Component Value Date/Time   NA 141 02/28/2021 1040   NA 140 12/11/2017 0000   K 4.7 02/28/2021 1040   CL 100 02/28/2021 1040   CO2 29 02/28/2021 1040   GLUCOSE 256 (H) 02/28/2021 1040   BUN 81 (H) 02/28/2021 1040   BUN 16 12/11/2017 0000   CREATININE 9.89 (H) 02/28/2021 1040   CALCIUM 9.0 02/28/2021 1040   CALCIUM 8.4 (L) 10/05/2017 0615   GFRNONAA 5 (L) 02/28/2021 1040   GFRAA 21 (L) 12/04/2019 2027   Estimated Creatinine Clearance: 6.8 mL/min (A) (by C-G formula based on SCr of 9.89 mg/dL (H)).  COAG Lab Results  Component Value Date   INR 1.0 12/16/2020   INR 1.4 (H) 02/04/2018   INR 3.78 01/04/2018   PROTIME 25.4 (A) 12/27/2017   PROTIME 33.5 (A) 12/24/2017   PROTIME 21.5 (A) 12/11/2017   Radiology DG Chest Portable 1 View  Result Date: 02/28/2021 CLINICAL DATA:  75 year old male missed dialysis, clogged port. Positive for COVID-19 four days ago. EXAM: PORTABLE CHEST 1 VIEW COMPARISON:  .  Chest radiographs 12/16/2020 and earlier FINDINGS: Portable AP upright view at 0909 hours. Chronic left loculated effusion and  atelectasis/airspace disease appears unchanged since 2020. Stable cardiac size and mediastinal contours. Prior CABG. Lower lung volumes overall. But only mild crowding at the right lung base. Right chest vascular stent and coarsely calcified left thyroid nodule are stable. No acute osseous abnormality identified. Negative visible bowel gas pattern. IMPRESSION: 1.  No acute cardiopulmonary abnormality. 2. Lower lung volumes with stable since 2020 chronic loculated left effusion and left lung disease. Electronically Signed   By: Genevie Ann M.D.   On: 02/28/2021 09:34   Assessment/Plan 1.  Complication dialysis device with thrombosis AV access:  Patient's tunneled catheter is thrombosed. The patient will undergo exchange of the catheter same venous access using interventional techniques. The risks and benefits were described to the patient. All questions were answered. The patient agrees to proceed with intervention.  2.  End-stage renal disease requiring hemodialysis:  Patient will continue dialysis therapy without further interruption if a successful exchange is not achieved then new site will be found for tunneled catheter placement. Dialysis has already been arranged since the patient missed their previous session 3.  Hypertension:  Patient will continue medical management; nephrology is following no changes in oral medications. 4. Diabetes mellitus:  Glucose will be monitored and oral medications been held this morning once the patient has undergone the patient's procedure po intake will be reinitiated and again Accu-Cheks will be used to assess the blood glucose level and treat as needed. The patient will be restarted on the patient's usual hypoglycemic regimen.  Discussed with Dr. Francene Castle, [name]  02/28/2021 1:25 PM

## 2021-02-28 NOTE — ED Notes (Signed)
Report given to white [employer] / spoke with daughter Biomedical engineer and gave update ./  Transportation requested via [employer]

## 2021-02-28 NOTE — ED Notes (Signed)
Pt stuck multiple times with no success. Lab called and will come and attempt to obtain labs.

## 2021-02-28 NOTE — ED Notes (Signed)
Spoke with Junie Panning from special procedures , gave report

## 2021-02-28 NOTE — ED Triage Notes (Addendum)
Pt comes with c/o clogged port. Pt reports last dialysis was last Friday. Pt has dialysis M,W, F. Pt missed Monday treatment.  EMS reports BP-190/88, HR-70, 98%RA.   Pt has dialysis cath located in right upper femoral. Pt is COVID+ dx on Friday, symptom free.

## 2021-02-28 NOTE — ED Notes (Signed)
Pt transported via EMS. 

## 2021-03-01 ENCOUNTER — Telehealth: Payer: Self-pay

## 2021-03-01 ENCOUNTER — Encounter: Payer: Self-pay | Admitting: Vascular Surgery

## 2021-03-01 NOTE — Telephone Encounter (Signed)
Called to discuss with patient about COVID-19 symptoms and the use of one of the available treatments for those with mild to moderate Covid symptoms and at a high risk of hospitalization.  Pt appears to qualify for outpatient treatment due to co-morbid conditions and/or a member of an at-risk group in accordance with the FDA Emergency Use Authorization.    Symptom onset: Unknown Vaccinated: Unknown Booster? Unknown Immunocompromised? No Qualifiers: ESRD,DM,CAD NIH Criteria: Tier 1  Unable to reach pt - Unable to leave message.   Ernest [name]

## 2021-03-07 DIAGNOSIS — U071 COVID-19: Secondary | ICD-10-CM | POA: Insufficient documentation

## 2021-03-13 DIAGNOSIS — Z20822 Contact with and (suspected) exposure to covid-19: Secondary | ICD-10-CM | POA: Insufficient documentation

## 2021-03-14 ENCOUNTER — Ambulatory Visit (INDEPENDENT_AMBULATORY_CARE_PROVIDER_SITE_OTHER): Payer: Medicare (Managed Care) | Admitting: Nurse Practitioner

## 2021-03-21 ENCOUNTER — Other Ambulatory Visit: Payer: Self-pay

## 2021-03-21 ENCOUNTER — Encounter (INDEPENDENT_AMBULATORY_CARE_PROVIDER_SITE_OTHER): Payer: Self-pay | Admitting: Nurse Practitioner

## 2021-03-21 ENCOUNTER — Ambulatory Visit (INDEPENDENT_AMBULATORY_CARE_PROVIDER_SITE_OTHER): Payer: Medicare (Managed Care) | Admitting: Nurse Practitioner

## 2021-03-21 VITALS — BP 109/64 | HR 76 | Resp 16

## 2021-03-21 DIAGNOSIS — E1151 Type 2 diabetes mellitus with diabetic peripheral angiopathy without gangrene: Secondary | ICD-10-CM

## 2021-03-21 DIAGNOSIS — E1122 Type 2 diabetes mellitus with diabetic chronic kidney disease: Secondary | ICD-10-CM | POA: Diagnosis not present

## 2021-03-21 DIAGNOSIS — Z794 Long term (current) use of insulin: Secondary | ICD-10-CM

## 2021-03-21 DIAGNOSIS — N186 End stage renal disease: Secondary | ICD-10-CM | POA: Diagnosis not present

## 2021-03-21 DIAGNOSIS — I120 Hypertensive chronic kidney disease with stage 5 chronic kidney disease or end stage renal disease: Secondary | ICD-10-CM

## 2021-03-21 DIAGNOSIS — Z992 Dependence on renal dialysis: Secondary | ICD-10-CM

## 2021-03-21 NOTE — Progress Notes (Signed)
Subjective:    Patient ID: Ernest [name], male    DOB: 1946-02-08, 75 y.o.   MRN: [id number] Chief Complaint  Patient presents with   Follow-up    [facility] 2 week cath dysfunction    Ernest [name] is a 75 year old male that presents today for follow-up evaluation after catheter placement in his right femoral groin.  Currently the patient is maintained completely via PermCath.  He previously has had accesses in both of his upper extremity with the most recent being in his left upper extremity.  The patient is a difficult historian due to slurred speech from recent stroke.  But based on information it seems that his catheter seems to have dysfunction on a recurrent basis.  He denies any fevers or chills however.   Review of Systems  Neurological:  Positive for weakness.  All other systems reviewed and are negative.     Objective:   Physical Exam Vitals reviewed.  HENT:     Head: Normocephalic.  Cardiovascular:     Rate and Rhythm: Normal rate.  Pulmonary:     Effort: Pulmonary effort is normal.  Neurological:     Mental Status: He is alert and oriented to person, place, and time. Mental status is at baseline.     Motor: Weakness present.  Psychiatric:        Mood and Affect: Mood normal.        Speech: Speech is slurred.        Behavior: Behavior normal.        Thought Content: Thought content normal.        Judgment: Judgment normal.    BP 109/64 (BP Location: Right Arm)   Pulse 76   Resp 16   Past Medical History:  Diagnosis Date   Abnormal stress test    s/p cath November 2013 with modest disease involving the ostial left main, proximal LAD, proximal RCA - do not appear to be hemodynamically signficant; mild LV dysfunction   Anemia    low iron   Anxiety    Arthritis    Bacteremia    Chronic systolic CHF (congestive heart failure) (HCC)    COPD (chronic obstructive pulmonary disease) ([place])    Coronary artery disease    a. per cath report 2013, b. 09/13/2017-CABG  X4 & Mitral valve repair   Depression    DVT (deep venous thrombosis) (HCC)    arm - right- finished blood thinner- not sure when it   Ejection fraction < 50%    Erectile dysfunction    penile implant   ESRD (end stage renal disease) ([place])    [facility] on a MWF schedule.      Gout    Hx: of   Hepatitis C    History of seizures    last one 2018   Hyperlipidemia    Hypertension    Mitral valve disease    s/p Mitral repair 09/13/17   Obesity    Retinopathy    Shortness of breath    Hx: of with exertion   Stroke [facility])    "several" left side weakness, speech affected   Type II or unspecified type diabetes mellitus without mention of complication, not stated as uncontrolled    adult onset    Social History   Socioeconomic History   Marital status: Married    Spouse name: Not on file   Number of children: Not on file   Years of education: Not on file   Highest  education level: Not on file  Occupational History   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  Vaping Use   Vaping Use: Never used  Substance and Sexual Activity   Alcohol use: No    Alcohol/week: 0.0 standard drinks   Drug use: No   Sexual activity: Not on file  Other Topics Concern   Not on file  Social History Narrative   Adopted mentally retarded child at home, 2 adopted children, 3 living and 2 deceased.   Social Determinants of Health   Financial Resource Strain: Not on file  Food Insecurity: Not on file  Transportation Needs: Not on file  Physical Activity: Not on file  Stress: Not on file  Social Connections: Not on file  Intimate Partner Violence: Not on file    Past Surgical History:  Procedure Laterality Date   A/V FISTULAGRAM N/A 10/04/2017   Procedure: A/V FISTULAGRAM;  Surgeon:  Elam Dutch, MD;  Location: [place] [facility];  Service: Cardiovascular;  Laterality: N/A;   A/V SHUNTOGRAM Left 07/12/2020   Procedure: A/V SHUNTOGRAM;  Surgeon: Serafina Mitchell, MD;  Location: [place] [facility];  Service: Cardiovascular;  Laterality: Left;   ANGIOPLASTY Left 09/24/2019   Procedure: Angioplasty Innominate Vien;  Surgeon: Serafina Mitchell, MD;  Location: [place];  Service: Vascular;  Laterality: Left;   AV FISTULA PLACEMENT Left 01/13/2013   Procedure: INSERTION OF ARTERIOVENOUS (AV) GORE-TEX GRAFT ARM;  Surgeon: Angelia Mould, MD;  Location: [place];  Service: Vascular;  Laterality: Left;   AV FISTULA PLACEMENT Left 05/05/2013   Procedure: INSERTION OF LEFT UPPER ARM  ARTERIOVENOUS GORTEX GRAFT;  Surgeon: Angelia Mould, MD;  Location: [place];  Service: Vascular;  Laterality: Left;   AV FISTULA PLACEMENT Left 11/26/2019   Procedure: INSERTION OF ARTERIOVENOUS (AV) GORE-TEX GRAFT, left  ARM;  Surgeon: Serafina Mitchell, MD;  Location: [place];  Service: Vascular;  Laterality: Left;   [place] REMOVAL Left 03/26/2013   Procedure: REMOVAL OF  NON INCORPORATED ARTERIOVENOUS GORETEX GRAFT ([place]) left arm * repair of  left brachial artery with vein patch angioplasty.;  Surgeon: Angelia Mould, MD;  Location: [place];  Service: Vascular;  Laterality: Left;   CARDIAC CATHETERIZATION  August 26, 2012   CATARACT EXTRACTION W/ INTRAOCULAR LENS  IMPLANT, BILATERAL     COLONOSCOPY     CORONARY ARTERY BYPASS GRAFT N/A 09/13/2017   Procedure: CORONARY ARTERY BYPASS GRAFTING (CABG) times four LIMA  to LAD, SVG sequentially to OM and RAMUS Intermediate and SVG to Acute Marginal;  Surgeon: Melrose Nakayama, MD;  Location: [place];  Service: Open Heart Surgery;  Laterality: N/A;   DIALYSIS/PERMA CATHETER INSERTION N/A 02/28/2021   Procedure: DIALYSIS/PERMA CATHETER EXCHANGE;  Surgeon: Katha Cabal, MD;  Location: [place] [facility];  Service: Cardiovascular;  Laterality: N/A;    EMBOLECTOMY Right 08/27/2013   Procedure: EMBOLECTOMY;  Surgeon: Mal Misty, MD;  Location: [place];  Service: Vascular;  Laterality: Right;  Thrombectomy of Radial and ulnar artery.   ENDOVASCULAR STENT INSERTION Left 09/24/2019   Procedure: Endovascular Stent Graft Insertion, Arm Graft;  Surgeon: Serafina Mitchell, MD;  Location: [place];  Service: Vascular;  Laterality: Left;  EYE SURGERY     laser.  and surgery for DM   fistula     RUE and wrist   FISTULOGRAM Left 09/24/2019   Procedure: SHUNTOGRAM OF ARTERIOVENOUS FISTULA LEFT ARM,;  Surgeon: Serafina Mitchell, MD;  Location: [place];  Service: Vascular;  Laterality: Left;   INSERTION OF DIALYSIS CATHETER Right 01/01/2013   Procedure: INSERTION OF DIALYSIS CATHETER right  internal jugular;  Surgeon: Serafina Mitchell, MD;  Location: [place];  Service: Vascular;  Laterality: Right;   INSERTION OF DIALYSIS CATHETER N/A 03/26/2013   Procedure: INSERTION OF DIALYSIS CATHETER Left internal jugular vein;  Surgeon: Angelia Mould, MD;  Location: [place];  Service: Vascular;  Laterality: N/A;   INSERTION OF DIALYSIS CATHETER Right 11/26/2019   Procedure: INSERTION OF DIALYSIS CATHETER, right internal jugular;  Surgeon: Serafina Mitchell, MD;  Location: [place];  Service: Vascular;  Laterality: Right;   IR THORACENTESIS ASP PLEURAL SPACE W/IMG GUIDE  09/30/2019   LEFT HEART CATH AND CORONARY ANGIOGRAPHY N/A 09/09/2017   Procedure: LEFT HEART CATH AND CORONARY ANGIOGRAPHY;  Surgeon: Troy Sine, MD;  Location: [place] [facility];  Service: Cardiovascular;  Laterality: N/A;   LIGATION OF ARTERIOVENOUS  FISTULA Right 12/30/2013   Procedure: REMOVAL OF SEGMENT OF GORTEX GRAFT AND FISTULA  AND REPAIR OF BRACHIAL ARTERY;  Surgeon: Mal Misty, MD;  Location: [place];  Service: Vascular;  Laterality: Right;   MITRAL VALVE REPAIR N/A 09/13/2017   Procedure: MITRAL VALVE REPAIR (MVR);  Surgeon: Melrose Nakayama, MD;  Location: [place];  Service: Open  Heart Surgery;  Laterality: N/A;   MITRAL VALVE REPAIR (MV)/CORONARY ARTERY BYPASS GRAFTING (CABG)  09/13/2017   PENILE PROSTHESIS IMPLANT     1997.. no card   PERIPHERAL VASCULAR BALLOON ANGIOPLASTY Left 10/04/2017   Procedure: PERIPHERAL VASCULAR BALLOON ANGIOPLASTY;  Surgeon: Elam Dutch, MD;  Location: [place] [facility];  Service: Cardiovascular;  Laterality: Left;  AVF   PERIPHERAL VASCULAR BALLOON ANGIOPLASTY Left 07/12/2020   Procedure: PERIPHERAL VASCULAR BALLOON ANGIOPLASTY;  Surgeon: Serafina Mitchell, MD;  Location: [place] [facility];  Service: Cardiovascular;  Laterality: Left;  ARM SHUNTOGRAM   REVISION OF ARTERIOVENOUS GORETEX GRAFT Left 09/24/2019   Procedure: REVISION OF ARTERIOVENOUS GORETEX GRAFT LEFT ARM;  Surgeon: Serafina Mitchell, MD;  [facility];  Service: Vascular;  Laterality: Left;   TEE WITHOUT CARDIOVERSION N/A 06/11/2013   Procedure: TRANSESOPHAGEAL ECHOCARDIOGRAM (TEE);  Surgeon: Larey Dresser, MD;  Location: [facility] ENDOSCOPY;  Service: Cardiovascular;  Laterality: N/A;   TEE WITHOUT CARDIOVERSION N/A 09/13/2017   Procedure: TRANSESOPHAGEAL ECHOCARDIOGRAM (TEE);  Surgeon: Melrose Nakayama, MD;  Location: [place];  Service: Open Heart Surgery;  Laterality: N/A;   THROMBECTOMY W/ EMBOLECTOMY Left 03/26/2013   Procedure: Attempted thrombectomy of left arm arteriovenous goretex graft.;  Surgeon: Angelia Mould, MD;  Location: [facility] OR;  Service: Vascular;  Laterality: Left;   ULTRASOUND GUIDANCE FOR VASCULAR ACCESS Bilateral 09/24/2019   Procedure: Ultrasound Guidance For Vascular Access, Right Femoral Vein and Left Arm Graft;  Surgeon: Serafina Mitchell, MD;  Location: [facility] OR;  Service: Vascular;  Laterality: Bilateral;   VENOGRAM N/A 04/07/2013   Procedure: VENOGRAM;  Surgeon: Serafina Mitchell, MD;  Location: [facility] CATH LAB;  Service: Cardiovascular;  Laterality: N/A;    Family History  Problem Relation Age of Onset   Heart disease Father    Diabetes Sister     Diabetes Brother    Diabetes  Son     [name]  CBC Latest Ref Rng & Units 02/28/2021 02/17/2021 12/16/2020  WBC 4.0 - 10.5 K/uL 5.2 8.9 9.3  Hemoglobin 13.0 - 17.0 g/dL 12.3(L) 12.1(L) 12.1(L)  Hematocrit 39.0 - 52.0 % 37.4(L) 37.7(L) 39.1  Platelets 150 - 400 K/uL 197 209 190      CMP     Component Value Date/Time   NA 141 02/28/2021 1040   NA 140 12/11/2017 0000   K 4.7 02/28/2021 1040   CL 100 02/28/2021 1040   CO2 29 02/28/2021 1040   GLUCOSE 256 (H) 02/28/2021 1040   BUN 81 (H) 02/28/2021 1040   BUN 16 12/11/2017 0000   CREATININE 9.89 (H) 02/28/2021 1040   CALCIUM 9.0 02/28/2021 1040   CALCIUM 8.4 (L) 10/05/2017 0615   PROT 7.0 12/16/2020 1242   ALBUMIN 3.2 (L) 12/16/2020 1242   AST 22 12/16/2020 1242   ALT 21 12/16/2020 1242   ALKPHOS 66 12/16/2020 1242   BILITOT 0.7 12/16/2020 1242   GFRNONAA 5 (L) 02/28/2021 1040   GFRAA 21 (L) 12/04/2019 2027     No results found.     Assessment & Plan:   1. ESRD (end stage renal disease) ([place]) In order to evaluate whether the patient is able to have new upper extremity access place, bilateral upper extremity venogram is necessary.  This would allow [place] not only to evaluate the central veins but also veins in the upper extremity for the best possible permanent access.  Risk, benefits and alternatives were discussed with the patient.  He agrees to proceed with intervention.  2. Type 2 diabetes mellitus with diabetic peripheral angiopathy without gangrene, with long-term current use of insulin ([place]) Continue hypoglycemic medications as already ordered, these medications have been reviewed and there are no changes at this time.  Hgb A1C to be monitored as already arranged by primary service   3. Hypertension due to end stage renal disease caused by type 2 diabetes mellitus, on dialysis [facility]) Continue antihypertensive medications as already ordered, these medications have been reviewed and there are no changes at  this time.    Current Outpatient Medications on File Prior to Visit  Medication Sig Dispense Refill   acetaminophen (TYLENOL) 500 MG tablet Take 1,000 mg by mouth every 6 (six) hours as needed for mild pain or moderate pain.     allopurinol (ZYLOPRIM) 100 MG tablet Take 100 mg by mouth daily.     amiodarone (PACERONE) 200 MG tablet Take 200 mg by mouth daily.     aspirin EC 81 MG tablet Take 1 tablet (81 mg total) by mouth daily. 30 tablet 11   atorvastatin (LIPITOR) 40 MG tablet Take 40 mg by mouth at bedtime.     cinacalcet (SENSIPAR) 30 MG tablet Take 30 mg by mouth every Monday, Wednesday, and Friday.     cyproheptadine (PERIACTIN) 4 MG tablet Take 2 mg by mouth 3 (three) times daily with meals.      docusate sodium (COLACE) 100 MG capsule Take 100 mg by mouth daily.     ferric citrate (AURYXIA) 1 GM 210 MG(Fe) tablet Take 210 mg by mouth daily. (with snack)     ferric citrate (AURYXIA) 1 GM 210 MG(Fe) tablet Take 420 mg by mouth 3 (three) times daily with meals.     insulin detemir (LEVEMIR) 100 UNIT/ML injection Inject 0.2 mLs (20 Units total) into the skin 2 (two) times daily. (Patient taking differently: Inject 17 Units into the skin  at bedtime.) 10 mL 11   levETIRAcetam (KEPPRA) 500 MG tablet Take 500 mg by mouth See admin instructions. Take 1 tablet ('500mg'$ ) by mouth twice daily every Tuesday, Thursday, Saturday and Sunday     levETIRAcetam (KEPPRA) 500 MG tablet Take 500 mg by mouth See admin instructions. Take 1 tablet ('500mg'$ ) by mouth three times daily on Monday, Wednesday and Friday     linaclotide (LINZESS) 145 MCG CAPS capsule Take 145 mcg by mouth daily as needed (constipation).      mirtazapine (REMERON) 7.5 MG tablet Take 7.5 mg by mouth at bedtime.      Nutritional Supplements (FEEDING SUPPLEMENT, NEPRO CARB STEADY,) LIQD Take 237 mLs by mouth 2 (two) times daily between meals. (Patient taking differently: Take 237 mLs by mouth daily as needed (nutritional supplement).)  0    Polyethyl Glycol-Propyl Glycol 0.4-0.3 % SOLN Place 1 drop into both eyes 3 (three) times daily as needed (dry/irritated eyes.).     polyethylene glycol (MIRALAX / GLYCOLAX) 17 g packet Take 17 g by mouth daily.     senna-docusate (SENOKOT-S) 8.6-50 MG tablet Take 1 tablet by mouth at bedtime.     sertraline (ZOLOFT) 25 MG tablet Take 25 mg by mouth at bedtime.     Current Facility-Administered Medications on File Prior to Visit  Medication Dose Route Frequency Provider Last Rate Last Admin   0.9 %  sodium chloride infusion  250 mL Intravenous PRN Serafina Mitchell, MD        There are no Patient Instructions on file for this visit. No follow-ups on file.   Kris Hartmann, NP

## 2021-03-31 DIAGNOSIS — A499 Bacterial infection, unspecified: Secondary | ICD-10-CM | POA: Insufficient documentation

## 2021-06-12 ENCOUNTER — Telehealth (INDEPENDENT_AMBULATORY_CARE_PROVIDER_SITE_OTHER): Payer: Self-pay | Admitting: Nurse Practitioner

## 2021-06-26 ENCOUNTER — Other Ambulatory Visit: Payer: Self-pay

## 2021-06-26 ENCOUNTER — Emergency Department: Payer: Medicare (Managed Care)

## 2021-06-26 ENCOUNTER — Inpatient Hospital Stay
Admission: EM | Admit: 2021-06-26 | Discharge: 2021-07-20 | DRG: 853 | Disposition: A | Discharge status: Skilled Nursing Facility | Payer: Medicare (Managed Care) | Attending: Internal Medicine | Admitting: Internal Medicine

## 2021-06-26 DIAGNOSIS — F419 Anxiety disorder, unspecified: Secondary | ICD-10-CM | POA: Diagnosis present

## 2021-06-26 DIAGNOSIS — Z7401 Bed confinement status: Secondary | ICD-10-CM

## 2021-06-26 DIAGNOSIS — E1165 Type 2 diabetes mellitus with hyperglycemia: Secondary | ICD-10-CM | POA: Diagnosis present

## 2021-06-26 DIAGNOSIS — I132 Hypertensive heart and chronic kidney disease with heart failure and with stage 5 chronic kidney disease, or end stage renal disease: Secondary | ICD-10-CM | POA: Diagnosis present

## 2021-06-26 DIAGNOSIS — I9581 Postprocedural hypotension: Secondary | ICD-10-CM | POA: Diagnosis not present

## 2021-06-26 DIAGNOSIS — L89152 Pressure ulcer of sacral region, stage 2: Secondary | ICD-10-CM | POA: Diagnosis present

## 2021-06-26 DIAGNOSIS — Z818 Family history of other mental and behavioral disorders: Secondary | ICD-10-CM

## 2021-06-26 DIAGNOSIS — K589 Irritable bowel syndrome without diarrhea: Secondary | ICD-10-CM | POA: Diagnosis present

## 2021-06-26 DIAGNOSIS — E876 Hypokalemia: Secondary | ICD-10-CM | POA: Diagnosis not present

## 2021-06-26 DIAGNOSIS — E785 Hyperlipidemia, unspecified: Secondary | ICD-10-CM | POA: Diagnosis present

## 2021-06-26 DIAGNOSIS — E875 Hyperkalemia: Secondary | ICD-10-CM | POA: Diagnosis present

## 2021-06-26 DIAGNOSIS — I5043 Acute on chronic combined systolic (congestive) and diastolic (congestive) heart failure: Secondary | ICD-10-CM | POA: Diagnosis present

## 2021-06-26 DIAGNOSIS — E11621 Type 2 diabetes mellitus with foot ulcer: Secondary | ICD-10-CM | POA: Diagnosis present

## 2021-06-26 DIAGNOSIS — E1122 Type 2 diabetes mellitus with diabetic chronic kidney disease: Secondary | ICD-10-CM | POA: Diagnosis present

## 2021-06-26 DIAGNOSIS — D638 Anemia in other chronic diseases classified elsewhere: Secondary | ICD-10-CM | POA: Diagnosis present

## 2021-06-26 DIAGNOSIS — N2581 Secondary hyperparathyroidism of renal origin: Secondary | ICD-10-CM | POA: Diagnosis present

## 2021-06-26 DIAGNOSIS — Z20822 Contact with and (suspected) exposure to covid-19: Secondary | ICD-10-CM | POA: Diagnosis present

## 2021-06-26 DIAGNOSIS — I444 Left anterior fascicular block: Secondary | ICD-10-CM | POA: Diagnosis present

## 2021-06-26 DIAGNOSIS — F0153 Vascular dementia, unspecified severity, with mood disturbance: Secondary | ICD-10-CM | POA: Diagnosis present

## 2021-06-26 DIAGNOSIS — Z66 Do not resuscitate: Secondary | ICD-10-CM | POA: Diagnosis not present

## 2021-06-26 DIAGNOSIS — E1142 Type 2 diabetes mellitus with diabetic polyneuropathy: Secondary | ICD-10-CM | POA: Diagnosis present

## 2021-06-26 DIAGNOSIS — A419 Sepsis, unspecified organism: Secondary | ICD-10-CM | POA: Diagnosis not present

## 2021-06-26 DIAGNOSIS — N186 End stage renal disease: Secondary | ICD-10-CM | POA: Diagnosis present

## 2021-06-26 DIAGNOSIS — Z8673 Personal history of transient ischemic attack (TIA), and cerebral infarction without residual deficits: Secondary | ICD-10-CM

## 2021-06-26 DIAGNOSIS — I70269 Atherosclerosis of native arteries of extremities with gangrene, unspecified extremity: Secondary | ICD-10-CM | POA: Diagnosis present

## 2021-06-26 DIAGNOSIS — R509 Fever, unspecified: Secondary | ICD-10-CM

## 2021-06-26 DIAGNOSIS — Z7982 Long term (current) use of aspirin: Secondary | ICD-10-CM

## 2021-06-26 DIAGNOSIS — E1169 Type 2 diabetes mellitus with other specified complication: Secondary | ICD-10-CM | POA: Diagnosis present

## 2021-06-26 DIAGNOSIS — Z951 Presence of aortocoronary bypass graft: Secondary | ICD-10-CM

## 2021-06-26 DIAGNOSIS — E1152 Type 2 diabetes mellitus with diabetic peripheral angiopathy with gangrene: Secondary | ICD-10-CM | POA: Diagnosis present

## 2021-06-26 DIAGNOSIS — J9601 Acute respiratory failure with hypoxia: Secondary | ICD-10-CM | POA: Diagnosis not present

## 2021-06-26 DIAGNOSIS — M869 Osteomyelitis, unspecified: Secondary | ICD-10-CM | POA: Diagnosis present

## 2021-06-26 DIAGNOSIS — E11319 Type 2 diabetes mellitus with unspecified diabetic retinopathy without macular edema: Secondary | ICD-10-CM | POA: Diagnosis present

## 2021-06-26 DIAGNOSIS — J449 Chronic obstructive pulmonary disease, unspecified: Secondary | ICD-10-CM | POA: Diagnosis present

## 2021-06-26 DIAGNOSIS — G40909 Epilepsy, unspecified, not intractable, without status epilepticus: Secondary | ICD-10-CM | POA: Diagnosis present

## 2021-06-26 DIAGNOSIS — L97519 Non-pressure chronic ulcer of other part of right foot with unspecified severity: Secondary | ICD-10-CM | POA: Diagnosis present

## 2021-06-26 DIAGNOSIS — M1990 Unspecified osteoarthritis, unspecified site: Secondary | ICD-10-CM | POA: Diagnosis present

## 2021-06-26 DIAGNOSIS — D631 Anemia in chronic kidney disease: Secondary | ICD-10-CM | POA: Diagnosis present

## 2021-06-26 DIAGNOSIS — Z794 Long term (current) use of insulin: Secondary | ICD-10-CM

## 2021-06-26 DIAGNOSIS — R569 Unspecified convulsions: Secondary | ICD-10-CM

## 2021-06-26 DIAGNOSIS — Z992 Dependence on renal dialysis: Secondary | ICD-10-CM

## 2021-06-26 DIAGNOSIS — Z87891 Personal history of nicotine dependence: Secondary | ICD-10-CM

## 2021-06-26 DIAGNOSIS — I70261 Atherosclerosis of native arteries of extremities with gangrene, right leg: Secondary | ICD-10-CM | POA: Diagnosis present

## 2021-06-26 DIAGNOSIS — R471 Dysarthria and anarthria: Secondary | ICD-10-CM | POA: Diagnosis present

## 2021-06-26 DIAGNOSIS — G9341 Metabolic encephalopathy: Secondary | ICD-10-CM | POA: Diagnosis present

## 2021-06-26 DIAGNOSIS — K219 Gastro-esophageal reflux disease without esophagitis: Secondary | ICD-10-CM | POA: Diagnosis present

## 2021-06-26 DIAGNOSIS — Z9981 Dependence on supplemental oxygen: Secondary | ICD-10-CM

## 2021-06-26 DIAGNOSIS — I739 Peripheral vascular disease, unspecified: Secondary | ICD-10-CM | POA: Diagnosis present

## 2021-06-26 DIAGNOSIS — R410 Disorientation, unspecified: Secondary | ICD-10-CM

## 2021-06-26 DIAGNOSIS — E872 Acidosis, unspecified: Secondary | ICD-10-CM | POA: Diagnosis present

## 2021-06-26 DIAGNOSIS — L89610 Pressure ulcer of right heel, unstageable: Secondary | ICD-10-CM | POA: Diagnosis present

## 2021-06-26 DIAGNOSIS — L03115 Cellulitis of right lower limb: Secondary | ICD-10-CM | POA: Diagnosis present

## 2021-06-26 DIAGNOSIS — I96 Gangrene, not elsewhere classified: Secondary | ICD-10-CM | POA: Diagnosis present

## 2021-06-26 DIAGNOSIS — E8770 Fluid overload, unspecified: Secondary | ICD-10-CM

## 2021-06-26 DIAGNOSIS — L8990 Pressure ulcer of unspecified site, unspecified stage: Secondary | ICD-10-CM | POA: Diagnosis present

## 2021-06-26 DIAGNOSIS — Z89611 Acquired absence of right leg above knee: Secondary | ICD-10-CM

## 2021-06-26 DIAGNOSIS — Z9115 Patient's noncompliance with renal dialysis: Secondary | ICD-10-CM

## 2021-06-26 DIAGNOSIS — I959 Hypotension, unspecified: Secondary | ICD-10-CM | POA: Diagnosis present

## 2021-06-26 DIAGNOSIS — I480 Paroxysmal atrial fibrillation: Secondary | ICD-10-CM | POA: Diagnosis present

## 2021-06-26 DIAGNOSIS — Z961 Presence of intraocular lens: Secondary | ICD-10-CM | POA: Diagnosis present

## 2021-06-26 DIAGNOSIS — I440 Atrioventricular block, first degree: Secondary | ICD-10-CM | POA: Diagnosis present

## 2021-06-26 DIAGNOSIS — M722 Plantar fascial fibromatosis: Secondary | ICD-10-CM | POA: Diagnosis present

## 2021-06-26 DIAGNOSIS — Z9841 Cataract extraction status, right eye: Secondary | ICD-10-CM

## 2021-06-26 DIAGNOSIS — R6521 Severe sepsis with septic shock: Secondary | ICD-10-CM | POA: Diagnosis present

## 2021-06-26 DIAGNOSIS — I2510 Atherosclerotic heart disease of native coronary artery without angina pectoris: Secondary | ICD-10-CM | POA: Diagnosis present

## 2021-06-26 DIAGNOSIS — M109 Gout, unspecified: Secondary | ICD-10-CM | POA: Diagnosis present

## 2021-06-26 DIAGNOSIS — Z8249 Family history of ischemic heart disease and other diseases of the circulatory system: Secondary | ICD-10-CM

## 2021-06-26 DIAGNOSIS — Z8619 Personal history of other infectious and parasitic diseases: Secondary | ICD-10-CM

## 2021-06-26 DIAGNOSIS — Z515 Encounter for palliative care: Secondary | ICD-10-CM

## 2021-06-26 DIAGNOSIS — Z9842 Cataract extraction status, left eye: Secondary | ICD-10-CM

## 2021-06-26 DIAGNOSIS — Z79899 Other long term (current) drug therapy: Secondary | ICD-10-CM

## 2021-06-26 DIAGNOSIS — Z833 Family history of diabetes mellitus: Secondary | ICD-10-CM

## 2021-06-26 DIAGNOSIS — N529 Male erectile dysfunction, unspecified: Secondary | ICD-10-CM | POA: Diagnosis present

## 2021-06-26 DIAGNOSIS — Z634 Disappearance and death of family member: Secondary | ICD-10-CM

## 2021-06-26 DIAGNOSIS — M899 Disorder of bone, unspecified: Secondary | ICD-10-CM | POA: Diagnosis present

## 2021-06-26 MED ORDER — VANCOMYCIN HCL IN DEXTROSE 1-5 GM/200ML-% IV SOLN
1000.0000 mg | Freq: Once | INTRAVENOUS | Status: AC
  Administered 2021-06-27: 1000 mg via INTRAVENOUS
  Filled 2021-06-26: qty 200

## 2021-06-26 MED ORDER — SODIUM CHLORIDE 0.9 % IV SOLN
1.0000 g | Freq: Once | INTRAVENOUS | Status: DC
  Administered 2021-06-27: 1 g via INTRAVENOUS
  Filled 2021-06-26: qty 10

## 2021-06-26 NOTE — ED Triage Notes (Signed)
From SNF for fever and pressure wounds taking oral antibiotics without improvement, elevated WBC; hx of ESRD

## 2021-06-27 ENCOUNTER — Emergency Department: Payer: Medicare (Managed Care)

## 2021-06-27 ENCOUNTER — Inpatient Hospital Stay (HOSPITAL_COMMUNITY)
Admit: 2021-06-27 | Discharge: 2021-06-27 | Disposition: A | Discharge status: Home / Self Care | Payer: Medicare (Managed Care) | Attending: Family Medicine | Admitting: Family Medicine

## 2021-06-27 ENCOUNTER — Inpatient Hospital Stay: Payer: Medicare (Managed Care)

## 2021-06-27 DIAGNOSIS — Z7189 Other specified counseling: Secondary | ICD-10-CM | POA: Diagnosis not present

## 2021-06-27 DIAGNOSIS — E1122 Type 2 diabetes mellitus with diabetic chronic kidney disease: Secondary | ICD-10-CM | POA: Diagnosis present

## 2021-06-27 DIAGNOSIS — L89152 Pressure ulcer of sacral region, stage 2: Secondary | ICD-10-CM | POA: Diagnosis present

## 2021-06-27 DIAGNOSIS — A419 Sepsis, unspecified organism: Secondary | ICD-10-CM | POA: Diagnosis present

## 2021-06-27 DIAGNOSIS — G9341 Metabolic encephalopathy: Secondary | ICD-10-CM | POA: Diagnosis present

## 2021-06-27 DIAGNOSIS — E8770 Fluid overload, unspecified: Secondary | ICD-10-CM | POA: Diagnosis not present

## 2021-06-27 DIAGNOSIS — I96 Gangrene, not elsewhere classified: Secondary | ICD-10-CM | POA: Diagnosis not present

## 2021-06-27 DIAGNOSIS — Z20822 Contact with and (suspected) exposure to covid-19: Secondary | ICD-10-CM | POA: Diagnosis present

## 2021-06-27 DIAGNOSIS — L0390 Cellulitis, unspecified: Secondary | ICD-10-CM

## 2021-06-27 DIAGNOSIS — Z1339 Encounter for screening examination for other mental health and behavioral disorders: Secondary | ICD-10-CM | POA: Diagnosis not present

## 2021-06-27 DIAGNOSIS — I5042 Chronic combined systolic (congestive) and diastolic (congestive) heart failure: Secondary | ICD-10-CM | POA: Diagnosis not present

## 2021-06-27 DIAGNOSIS — R410 Disorientation, unspecified: Secondary | ICD-10-CM | POA: Diagnosis not present

## 2021-06-27 DIAGNOSIS — R509 Fever, unspecified: Secondary | ICD-10-CM | POA: Diagnosis not present

## 2021-06-27 DIAGNOSIS — I70263 Atherosclerosis of native arteries of extremities with gangrene, bilateral legs: Secondary | ICD-10-CM | POA: Diagnosis not present

## 2021-06-27 DIAGNOSIS — R6521 Severe sepsis with septic shock: Secondary | ICD-10-CM | POA: Diagnosis present

## 2021-06-27 DIAGNOSIS — E11621 Type 2 diabetes mellitus with foot ulcer: Secondary | ICD-10-CM | POA: Diagnosis present

## 2021-06-27 DIAGNOSIS — Z515 Encounter for palliative care: Secondary | ICD-10-CM | POA: Diagnosis not present

## 2021-06-27 DIAGNOSIS — I9581 Postprocedural hypotension: Secondary | ICD-10-CM | POA: Diagnosis not present

## 2021-06-27 DIAGNOSIS — D638 Anemia in other chronic diseases classified elsewhere: Secondary | ICD-10-CM | POA: Diagnosis not present

## 2021-06-27 DIAGNOSIS — I5021 Acute systolic (congestive) heart failure: Secondary | ICD-10-CM

## 2021-06-27 DIAGNOSIS — N186 End stage renal disease: Secondary | ICD-10-CM | POA: Diagnosis present

## 2021-06-27 DIAGNOSIS — I5043 Acute on chronic combined systolic (congestive) and diastolic (congestive) heart failure: Secondary | ICD-10-CM | POA: Diagnosis present

## 2021-06-27 DIAGNOSIS — Z992 Dependence on renal dialysis: Secondary | ICD-10-CM | POA: Diagnosis not present

## 2021-06-27 DIAGNOSIS — Z66 Do not resuscitate: Secondary | ICD-10-CM | POA: Diagnosis not present

## 2021-06-27 DIAGNOSIS — E109 Type 1 diabetes mellitus without complications: Secondary | ICD-10-CM | POA: Diagnosis not present

## 2021-06-27 DIAGNOSIS — J9601 Acute respiratory failure with hypoxia: Secondary | ICD-10-CM

## 2021-06-27 DIAGNOSIS — I9589 Other hypotension: Secondary | ICD-10-CM | POA: Diagnosis not present

## 2021-06-27 DIAGNOSIS — I70261 Atherosclerosis of native arteries of extremities with gangrene, right leg: Secondary | ICD-10-CM | POA: Diagnosis present

## 2021-06-27 DIAGNOSIS — R6520 Severe sepsis without septic shock: Secondary | ICD-10-CM

## 2021-06-27 DIAGNOSIS — M869 Osteomyelitis, unspecified: Secondary | ICD-10-CM | POA: Diagnosis present

## 2021-06-27 DIAGNOSIS — J449 Chronic obstructive pulmonary disease, unspecified: Secondary | ICD-10-CM | POA: Diagnosis present

## 2021-06-27 DIAGNOSIS — I959 Hypotension, unspecified: Secondary | ICD-10-CM | POA: Diagnosis not present

## 2021-06-27 DIAGNOSIS — E1142 Type 2 diabetes mellitus with diabetic polyneuropathy: Secondary | ICD-10-CM | POA: Diagnosis present

## 2021-06-27 DIAGNOSIS — G40909 Epilepsy, unspecified, not intractable, without status epilepticus: Secondary | ICD-10-CM | POA: Diagnosis present

## 2021-06-27 DIAGNOSIS — I132 Hypertensive heart and chronic kidney disease with heart failure and with stage 5 chronic kidney disease, or end stage renal disease: Secondary | ICD-10-CM | POA: Diagnosis present

## 2021-06-27 DIAGNOSIS — E1169 Type 2 diabetes mellitus with other specified complication: Secondary | ICD-10-CM | POA: Diagnosis present

## 2021-06-27 DIAGNOSIS — E1165 Type 2 diabetes mellitus with hyperglycemia: Secondary | ICD-10-CM | POA: Diagnosis present

## 2021-06-27 DIAGNOSIS — D631 Anemia in chronic kidney disease: Secondary | ICD-10-CM | POA: Diagnosis present

## 2021-06-27 DIAGNOSIS — E1152 Type 2 diabetes mellitus with diabetic peripheral angiopathy with gangrene: Secondary | ICD-10-CM | POA: Diagnosis present

## 2021-06-27 DIAGNOSIS — I739 Peripheral vascular disease, unspecified: Secondary | ICD-10-CM | POA: Diagnosis not present

## 2021-06-27 DIAGNOSIS — E11319 Type 2 diabetes mellitus with unspecified diabetic retinopathy without macular edema: Secondary | ICD-10-CM | POA: Diagnosis present

## 2021-06-27 LAB — ECHOCARDIOGRAM COMPLETE
AR max vel: 2.75 cm2
AV Area VTI: 3 cm2
AV Area mean vel: 2.37 cm2
AV Mean grad: 2 mmHg
AV Peak grad: 3.6 mmHg
Ao pk vel: 0.95 m/s
Area-P 1/2: 3.63 cm2
Height: 69 in
S' Lateral: 3.01 cm
Weight: 2892.44 oz

## 2021-06-27 LAB — CBC
HCT: 29.1 % — ABNORMAL LOW (ref 39.0–52.0)
Hemoglobin: 9.1 g/dL — ABNORMAL LOW (ref 13.0–17.0)
MCH: 27.5 pg (ref 26.0–34.0)
MCHC: 31.3 g/dL (ref 30.0–36.0)
MCV: 87.9 fL (ref 80.0–100.0)
Platelets: 267 10*3/uL (ref 150–400)
RBC: 3.31 MIL/uL — ABNORMAL LOW (ref 4.22–5.81)
RDW: 15.1 % (ref 11.5–15.5)
WBC: 19.6 10*3/uL — ABNORMAL HIGH (ref 4.0–10.5)
nRBC: 0 % (ref 0.0–0.2)

## 2021-06-27 LAB — COMPREHENSIVE METABOLIC PANEL
ALT: 14 U/L (ref 0–44)
AST: 16 U/L (ref 15–41)
Albumin: 2.4 g/dL — ABNORMAL LOW (ref 3.5–5.0)
Alkaline Phosphatase: 110 U/L (ref 38–126)
Anion gap: 14 (ref 5–15)
BUN: 62 mg/dL — ABNORMAL HIGH (ref 8–23)
CO2: 28 mmol/L (ref 22–32)
Calcium: 8.1 mg/dL — ABNORMAL LOW (ref 8.9–10.3)
Chloride: 93 mmol/L — ABNORMAL LOW (ref 98–111)
Creatinine, Ser: 7.02 mg/dL — ABNORMAL HIGH (ref 0.61–1.24)
GFR, Estimated: 8 mL/min — ABNORMAL LOW (ref >60)
Glucose, Bld: 330 mg/dL — ABNORMAL HIGH (ref 70–99)
Potassium: 5.3 mmol/L — ABNORMAL HIGH (ref 3.5–5.1)
Sodium: 135 mmol/L (ref 135–145)
Total Bilirubin: 0.6 mg/dL (ref 0.3–1.2)
Total Protein: 6.6 g/dL (ref 6.5–8.1)

## 2021-06-27 LAB — APTT: aPTT: 34 seconds (ref 24–36)

## 2021-06-27 LAB — CBC WITH DIFFERENTIAL/PLATELET
Abs Immature Granulocytes: 0.12 10*3/uL — ABNORMAL HIGH (ref 0.00–0.07)
Basophils Absolute: 0.1 10*3/uL (ref 0.0–0.1)
Basophils Relative: 0 %
Eosinophils Absolute: 0 10*3/uL (ref 0.0–0.5)
Eosinophils Relative: 0 %
HCT: 28.3 % — ABNORMAL LOW (ref 39.0–52.0)
Hemoglobin: 9.1 g/dL — ABNORMAL LOW (ref 13.0–17.0)
Immature Granulocytes: 1 %
Lymphocytes Relative: 2 %
Lymphs Abs: 0.3 10*3/uL — ABNORMAL LOW (ref 0.7–4.0)
MCH: 28.7 pg (ref 26.0–34.0)
MCHC: 32.2 g/dL (ref 30.0–36.0)
MCV: 89.3 fL (ref 80.0–100.0)
Monocytes Absolute: 0.5 10*3/uL (ref 0.1–1.0)
Monocytes Relative: 3 %
Neutro Abs: 18.6 10*3/uL — ABNORMAL HIGH (ref 1.7–7.7)
Neutrophils Relative %: 94 %
Platelets: 272 10*3/uL (ref 150–400)
RBC: 3.17 MIL/uL — ABNORMAL LOW (ref 4.22–5.81)
RDW: 14.8 % (ref 11.5–15.5)
WBC: 19.6 10*3/uL — ABNORMAL HIGH (ref 4.0–10.5)
nRBC: 0 % (ref 0.0–0.2)

## 2021-06-27 LAB — BLOOD GAS, VENOUS
Acid-Base Excess: 5.3 mmol/L — ABNORMAL HIGH (ref 0.0–2.0)
Bicarbonate: 32.8 mmol/L — ABNORMAL HIGH (ref 20.0–28.0)
O2 Saturation: 39.7 %
Patient temperature: 37
pCO2, Ven: 58 mmHg (ref 44.0–60.0)
pH, Ven: 7.36 (ref 7.250–7.430)
pO2, Ven: <31 mmHg — CL (ref 32.0–45.0)

## 2021-06-27 LAB — RESP PANEL BY RT-PCR (FLU A&B, COVID) ARPGX2
Influenza A by PCR: NEGATIVE
Influenza B by PCR: NEGATIVE
SARS Coronavirus 2 by RT PCR: NEGATIVE

## 2021-06-27 LAB — PROTIME-INR
INR: 1.4 — ABNORMAL HIGH (ref 0.8–1.2)
INR: 1.4 — ABNORMAL HIGH (ref 0.8–1.2)
Prothrombin Time: 16.7 seconds — ABNORMAL HIGH (ref 11.4–15.2)
Prothrombin Time: 16.6 seconds — ABNORMAL HIGH (ref 11.4–15.2)

## 2021-06-27 LAB — PROCALCITONIN
Procalcitonin: 13.24 ng/mL
Procalcitonin: 26.7 ng/mL

## 2021-06-27 LAB — BASIC METABOLIC PANEL
Anion gap: 12 (ref 5–15)
BUN: 73 mg/dL — ABNORMAL HIGH (ref 8–23)
CO2: 30 mmol/L (ref 22–32)
Calcium: 8.1 mg/dL — ABNORMAL LOW (ref 8.9–10.3)
Chloride: 94 mmol/L — ABNORMAL LOW (ref 98–111)
Creatinine, Ser: 7.31 mg/dL — ABNORMAL HIGH (ref 0.61–1.24)
GFR, Estimated: 7 mL/min — ABNORMAL LOW (ref >60)
Glucose, Bld: 296 mg/dL — ABNORMAL HIGH (ref 70–99)
Potassium: 5.1 mmol/L (ref 3.5–5.1)
Sodium: 136 mmol/L (ref 135–145)

## 2021-06-27 LAB — HEPATITIS B SURFACE ANTIBODY,QUALITATIVE: Hep B S Ab: REACTIVE — AB

## 2021-06-27 LAB — HEPATITIS B SURFACE ANTIGEN: Hepatitis B Surface Ag: NONREACTIVE

## 2021-06-27 LAB — LACTIC ACID, PLASMA
Lactic Acid, Venous: 3.1 mmol/L (ref 0.5–1.9)
Lactic Acid, Venous: 2.9 mmol/L (ref 0.5–1.9)

## 2021-06-27 LAB — GLUCOSE, CAPILLARY: Glucose-Capillary: 145 mg/dL — ABNORMAL HIGH (ref 70–99)

## 2021-06-27 LAB — CORTISOL-AM, BLOOD: Cortisol - AM: 21 ug/dL (ref 6.7–22.6)

## 2021-06-27 MED ORDER — ALLOPURINOL 100 MG PO TABS
100.0000 mg | ORAL_TABLET | Freq: Every day | ORAL | Status: DC
  Administered 2021-06-27 – 2021-07-20 (×21): 100 mg via ORAL
  Filled 2021-06-27 (×26): qty 1

## 2021-06-27 MED ORDER — MORPHINE SULFATE (PF) 2 MG/ML IV SOLN: 2.0000 mg | INTRAVENOUS | Status: DC | PRN

## 2021-06-27 MED ORDER — CINACALCET HCL 30 MG PO TABS: 30.0000 mg | ORAL_TABLET | ORAL | Status: DC

## 2021-06-27 MED ORDER — VANCOMYCIN HCL IN DEXTROSE 1-5 GM/200ML-% IV SOLN
1000.0000 mg | INTRAVENOUS | Status: DC
  Administered 2021-06-28: 1000 mg via INTRAVENOUS
  Filled 2021-06-27 (×3): qty 200

## 2021-06-27 MED ORDER — MAGNESIUM HYDROXIDE 400 MG/5ML PO SUSP: 30.0000 mL | Freq: Every day | ORAL | Status: DC | PRN

## 2021-06-27 MED ORDER — VANCOMYCIN HCL 750 MG/150ML IV SOLN
750.0000 mg | Freq: Once | INTRAVENOUS | Status: DC
  Filled 2021-06-27: qty 150

## 2021-06-27 MED ORDER — SEVELAMER CARBONATE 800 MG PO TABS
800.0000 mg | ORAL_TABLET | Freq: Three times a day (TID) | ORAL | Status: DC
  Administered 2021-06-27 – 2021-07-20 (×53): 800 mg via ORAL
  Filled 2021-06-27 (×54): qty 1

## 2021-06-27 MED ORDER — ATORVASTATIN CALCIUM 20 MG PO TABS
40.0000 mg | ORAL_TABLET | Freq: Every day | ORAL | Status: DC
  Administered 2021-06-27 – 2021-07-20 (×22): 40 mg via ORAL
  Filled 2021-06-27 (×24): qty 2

## 2021-06-27 MED ORDER — LINACLOTIDE 145 MCG PO CAPS: 145.0000 ug | ORAL_CAPSULE | Freq: Every day | ORAL | Status: DC | PRN

## 2021-06-27 MED ORDER — PENTAFLUOROPROP-TETRAFLUOROETH EX AERO
1.0000 "application " | INHALATION_SPRAY | CUTANEOUS | Status: DC | PRN
  Filled 2021-06-27: qty 30

## 2021-06-27 MED ORDER — ACETAMINOPHEN 650 MG RE SUPP: 650.0000 mg | Freq: Four times a day (QID) | RECTAL | Status: DC | PRN

## 2021-06-27 MED ORDER — LEVETIRACETAM 500 MG PO TABS
500.0000 mg | ORAL_TABLET | Freq: Two times a day (BID) | ORAL | Status: DC
  Administered 2021-06-27 – 2021-07-20 (×42): 500 mg via ORAL
  Filled 2021-06-27 (×47): qty 1

## 2021-06-27 MED ORDER — DOCUSATE SODIUM 100 MG PO CAPS
100.0000 mg | ORAL_CAPSULE | Freq: Every day | ORAL | Status: DC
  Administered 2021-06-27 – 2021-07-20 (×22): 100 mg via ORAL
  Filled 2021-06-27 (×24): qty 1

## 2021-06-27 MED ORDER — ACETAMINOPHEN 325 MG PO TABS
650.0000 mg | ORAL_TABLET | Freq: Four times a day (QID) | ORAL | Status: DC | PRN
Start: 2021-06-27 — End: 2021-06-29

## 2021-06-27 MED ORDER — MIRTAZAPINE 15 MG PO TABS
7.5000 mg | ORAL_TABLET | Freq: Every day | ORAL | Status: DC
  Administered 2021-06-27 – 2021-07-11 (×15): 7.5 mg via ORAL
  Filled 2021-06-27 (×15): qty 1

## 2021-06-27 MED ORDER — VANCOMYCIN HCL IN DEXTROSE 1-5 GM/200ML-% IV SOLN
1000.0000 mg | INTRAVENOUS | Status: DC | PRN
  Filled 2021-06-27: qty 200

## 2021-06-27 MED ORDER — VANCOMYCIN HCL 750 MG/150ML IV SOLN
750.0000 mg | Freq: Once | INTRAVENOUS | Status: AC
  Administered 2021-06-27: 750 mg via INTRAVENOUS
  Filled 2021-06-27: qty 150

## 2021-06-27 MED ORDER — CHLORHEXIDINE GLUCONATE CLOTH 2 % EX PADS
6.0000 | MEDICATED_PAD | Freq: Every day | CUTANEOUS | Status: DC
  Administered 2021-06-28 – 2021-07-08 (×11): 6 via TOPICAL

## 2021-06-27 MED ORDER — ONDANSETRON HCL 4 MG PO TABS: 4.0000 mg | ORAL_TABLET | Freq: Four times a day (QID) | ORAL | Status: DC | PRN

## 2021-06-27 MED ORDER — INSULIN DETEMIR 100 UNIT/ML ~~LOC~~ SOLN
17.0000 [IU] | Freq: Every day | SUBCUTANEOUS | Status: DC
  Administered 2021-06-27 – 2021-07-02 (×4): 17 [IU] via SUBCUTANEOUS
  Filled 2021-06-27 (×7): qty 0.17

## 2021-06-27 MED ORDER — SENNOSIDES-DOCUSATE SODIUM 8.6-50 MG PO TABS
1.0000 | ORAL_TABLET | Freq: Every day | ORAL | Status: DC
  Administered 2021-06-27 – 2021-07-19 (×21): 1 via ORAL
  Filled 2021-06-27 (×21): qty 1

## 2021-06-27 MED ORDER — SODIUM CHLORIDE 0.9 % IV SOLN: 100.0000 mL | INTRAVENOUS | Status: DC | PRN

## 2021-06-27 MED ORDER — SERTRALINE HCL 50 MG PO TABS
25.0000 mg | ORAL_TABLET | Freq: Every day | ORAL | Status: DC
  Administered 2021-06-27 – 2021-07-19 (×23): 25 mg via ORAL
  Filled 2021-06-27 (×23): qty 1

## 2021-06-27 MED ORDER — VANCOMYCIN HCL IN DEXTROSE 1-5 GM/200ML-% IV SOLN: 1000.0000 mg | Freq: Once | INTRAVENOUS | Status: DC

## 2021-06-27 MED ORDER — AMIODARONE HCL 200 MG PO TABS
200.0000 mg | ORAL_TABLET | Freq: Every day | ORAL | Status: DC
  Administered 2021-06-27 – 2021-07-20 (×23): 200 mg via ORAL
  Filled 2021-06-27 (×24): qty 1

## 2021-06-27 MED ORDER — NEPRO/CARBSTEADY PO LIQD: 237.0000 mL | Freq: Every day | ORAL | Status: DC | PRN

## 2021-06-27 MED ORDER — PIPERACILLIN-TAZOBACTAM 3.375 G IVPB 30 MIN
3.3750 g | Freq: Once | INTRAVENOUS | Status: AC
  Administered 2021-06-27: 3.375 g via INTRAVENOUS
  Filled 2021-06-27: qty 50

## 2021-06-27 MED ORDER — FUROSEMIDE 10 MG/ML IJ SOLN
60.0000 mg | Freq: Two times a day (BID) | INTRAMUSCULAR | Status: DC
  Administered 2021-06-27 – 2021-06-29 (×4): 60 mg via INTRAVENOUS
  Filled 2021-06-27 (×5): qty 8

## 2021-06-27 MED ORDER — FERRIC CITRATE 1 GM 210 MG(FE) PO TABS: 420.0000 mg | ORAL_TABLET | Freq: Three times a day (TID) | ORAL | Status: DC

## 2021-06-27 MED ORDER — ALTEPLASE 2 MG IJ SOLR: 2.0000 mg | Freq: Once | INTRAMUSCULAR | Status: DC | PRN

## 2021-06-27 MED ORDER — FERRIC CITRATE 1 GM 210 MG(FE) PO TABS: 210.0000 mg | ORAL_TABLET | Freq: Every day | ORAL | Status: DC

## 2021-06-27 MED ORDER — POLYETHYLENE GLYCOL 3350 17 G PO PACK
17.0000 g | PACK | Freq: Every day | ORAL | Status: DC
  Administered 2021-06-27 – 2021-07-02 (×2): 17 g via ORAL
  Filled 2021-06-27 (×5): qty 1

## 2021-06-27 MED ORDER — MIDODRINE HCL 5 MG PO TABS
10.0000 mg | ORAL_TABLET | Freq: Once | ORAL | Status: AC
  Administered 2021-06-27: 10 mg via ORAL
  Filled 2021-06-27: qty 2

## 2021-06-27 MED ORDER — HEPARIN SODIUM (PORCINE) 1000 UNIT/ML DIALYSIS
1000.0000 [IU] | INTRAMUSCULAR | Status: DC | PRN
Start: 2021-06-27 — End: 2021-06-28

## 2021-06-27 MED ORDER — LACTATED RINGERS IV BOLUS
250.0000 mL | Freq: Once | INTRAVENOUS | Status: AC
  Administered 2021-06-27: 250 mL via INTRAVENOUS

## 2021-06-27 MED ORDER — LEVETIRACETAM 500 MG PO TABS
500.0000 mg | ORAL_TABLET | ORAL | Status: DC
  Administered 2021-06-28 – 2021-07-10 (×6): 500 mg via ORAL
  Filled 2021-06-27 (×6): qty 1

## 2021-06-27 MED ORDER — ENOXAPARIN SODIUM 40 MG/0.4ML IJ SOSY: 40.0000 mg | PREFILLED_SYRINGE | INTRAMUSCULAR | Status: DC

## 2021-06-27 MED ORDER — HEPARIN SODIUM (PORCINE) 5000 UNIT/ML IJ SOLN
5000.0000 [IU] | Freq: Three times a day (TID) | INTRAMUSCULAR | Status: DC
  Administered 2021-06-27 – 2021-07-20 (×65): 5000 [IU] via SUBCUTANEOUS
  Filled 2021-06-27 (×66): qty 1

## 2021-06-27 MED ORDER — METRONIDAZOLE 500 MG/100ML IV SOLN
500.0000 mg | Freq: Two times a day (BID) | INTRAVENOUS | Status: AC
  Administered 2021-06-27 – 2021-07-03 (×13): 500 mg via INTRAVENOUS
  Filled 2021-06-27 (×14): qty 100

## 2021-06-27 MED ORDER — SODIUM CHLORIDE 0.9 % IV SOLN
1.0000 g | INTRAVENOUS | Status: DC
  Administered 2021-06-27 – 2021-06-30 (×4): 1 g via INTRAVENOUS
  Filled 2021-06-27 (×4): qty 1

## 2021-06-27 MED ORDER — ONDANSETRON HCL 4 MG/2ML IJ SOLN
4.0000 mg | Freq: Four times a day (QID) | INTRAMUSCULAR | Status: DC | PRN
  Administered 2021-07-08: 4 mg via INTRAVENOUS

## 2021-06-27 MED ORDER — TRAZODONE HCL 50 MG PO TABS
25.0000 mg | ORAL_TABLET | Freq: Every evening | ORAL | Status: DC | PRN
  Administered 2021-06-27 – 2021-07-03 (×2): 25 mg via ORAL
  Filled 2021-06-27 (×2): qty 1

## 2021-06-27 MED ORDER — LIDOCAINE-PRILOCAINE 2.5-2.5 % EX CREA
1.0000 "application " | TOPICAL_CREAM | CUTANEOUS | Status: DC | PRN
  Filled 2021-06-27: qty 5

## 2021-06-27 MED ORDER — LIDOCAINE HCL (PF) 1 % IJ SOLN
5.0000 mL | INTRAMUSCULAR | Status: DC | PRN
  Filled 2021-06-27: qty 5

## 2021-06-27 MED ORDER — SODIUM CHLORIDE 0.9 % IV SOLN: 1.0000 g | Freq: Once | INTRAVENOUS | Status: DC

## 2021-06-27 NOTE — Plan of Care (Signed)
  Problem: Education: Goal: Knowledge of General Education information will improve Description: Including pain rating scale, medication(s)/side effects and non-pharmacologic comfort measures Outcome: Not Progressing   Problem: Health Behavior/Discharge Planning: Goal: Ability to manage health-related needs will improve Outcome: Progressing   Problem: Clinical Measurements: Goal: Ability to maintain clinical measurements within normal limits will improve Outcome: Progressing Goal: Will remain free from infection Outcome: Progressing Goal: Diagnostic test results will improve Outcome: Progressing Goal: Respiratory complications will improve Outcome: Progressing Goal: Cardiovascular complication will be avoided Outcome: Progressing   Problem: Activity: Goal: Risk for activity intolerance will decrease Outcome: Not Progressing   Problem: Nutrition: Goal: Adequate nutrition will be maintained Outcome: Progressing   Problem: Coping: Goal: Level of anxiety will decrease Outcome: Progressing   Problem: Pain Managment: Goal: General experience of comfort will improve Outcome: Progressing   Problem: Skin Integrity: Goal: Risk for impaired skin integrity will decrease Outcome: Progressing

## 2021-06-27 NOTE — Consult Note (Signed)
Pharmacy Antibiotic Note  Ernest [name] is a 75 y.o. male admitted on 06/26/2021 with sepsis.  Pharmacy has been consulted for vancomycin and cefepime dosing. Patient with history of ESRD on HD (MWF). Patient has had a pressure ulcer on the right heel for which she has been receiving oral antibiotics.  The wound has been getting progressively worse and now he has a fever.  Plan: Cefepime 1 gram Q24H Vancomycin 1000 mg given. Will order additional 750 mg for total LD of 1750 mg x 1  Vancomycin 1000 mg qHD session -- follow up for inpatient regimen Goal pre-HD random: 15-25 Vancomycin random level as warranted    Height: '5\' 9"'$  (175.3 cm) Weight: 82 kg (180 lb 12.4 oz) IBW/kg (Calculated) : 70.7  Temp (24hrs), Avg:100.1 F (37.8 C), Min:100.1 F (37.8 C), Max:100.1 F (37.8 C)  Recent Labs  Lab 06/27/21 0042 06/27/21 0226  WBC 19.6*  --   CREATININE 7.02*  --   LATICACIDVEN 3.1* 2.9*    Estimated Creatinine Clearance: 9.5 mL/min (A) (by C-G formula based on SCr of 7.02 mg/dL (H)).    [name]  Antimicrobials this admission: 9/13 Ceftriaxone >> x 1 9/13 Zosyn >> x 1 9/13 Vancomycin >> 9/13 Metronidazole>> 9/13 Cefepime>>   Microbiology results: 9/13 BCx: sent   Thank you for allowing pharmacy to be a part of this patient's care.  Dorothe Pea, PharmD, BCPS Clinical Pharmacist   06/27/2021 3:25 AM

## 2021-06-27 NOTE — Progress Notes (Signed)
Brief hospitalist update note.  This is a nonbillable note.  Please see same-day H&P from Dr. Sidney Ace for full billable details.  Briefly, this is a 75 year old male with history of ESRD on hemodialysis who presents with worsening pain in the right heel.  Lives full-time at a skilled nursing facility.  Noted large necrotic ulcer on right heel.  Seen in consultation by nephrology and podiatry.  Nephrology will coordinate inpatient hemodialysis.  Podiatry did ordered MRI right foot.  Anticipate the need for surgical debridement versus below the knee amputation.  Per podiatry request I have also consulted vascular surgery.  Ralene Muskrat MD

## 2021-06-27 NOTE — ED Provider Notes (Addendum)
[facility] Emergency Department Provider Note  ____________________________________________  Time seen: Approximately 12:04 AM  I have reviewed the triage vital signs and the nursing notes.   HISTORY  Chief Complaint Fever  Level 5 caveat:  Portions of the history and physical were unable to be obtained due to dementia   HPI Ernest [name] is a 75 y.o. male with a history of vascular dementia, ESRD on HD (MWF), CHF, COPD, CAD, hepatitis C, seizure, hypertension, hyperlipidemia, diabetes who presents for evaluation of fever.  Patient has had a pressure ulcer on the right heel for which she has been receiving oral antibiotics.  The wound has been getting progressively worse and now he has a fever.  He had an elevated white count at the nursing home.  Patient's roommate also tested positive for COVID 2 days ago.  Patient denies chest pain, cough, shortness of breath but he is hypoxic.  He tested negative for COVID at the nursing home.  Patient's only complaint is right foot pain which is constant, throbbing/sharp and moderate in intensity.   Past Medical History:  Diagnosis Date   Abnormal stress test    s/p cath November 2013 with modest disease involving the ostial left main, proximal LAD, proximal RCA - do not appear to be hemodynamically signficant; mild LV dysfunction   Anemia    low iron   Anxiety    Arthritis    Bacteremia    Chronic systolic CHF (congestive heart failure) (HCC)    COPD (chronic obstructive pulmonary disease) ([place])    Coronary artery disease    a. per cath report 2013, b. 09/13/2017-CABG X4 & Mitral valve repair   Depression    DVT (deep venous thrombosis) (HCC)    arm - right- finished blood thinner- not sure when it   Ejection fraction < 50%    Erectile dysfunction    penile implant   ESRD (end stage renal disease) ([place])    [facility] on a MWF schedule.      Gout    Hx: of   Hepatitis C    History of seizures    last one  2018   Hyperlipidemia    Hypertension    Mitral valve disease    s/p Mitral repair 09/13/17   Obesity    Retinopathy    Shortness of breath    Hx: of with exertion   Stroke [facility])    "several" left side weakness, speech affected   Type II or unspecified type diabetes mellitus without mention of complication, not stated as uncontrolled    adult onset    Patient Active Problem List   Diagnosis Date Noted   Disorder of phosphorus metabolism, unspecified 02/20/2021   Hypercalcemia 12/08/2020   Atherosclerotic heart disease of native coronary artery without angina pectoris 11/18/2020   Diarrhea, unspecified 11/18/2020   Fever, unspecified 11/18/2020   Other fluid overload 11/18/2020   Pleural effusion on left 09/30/2019   Acute respiratory failure with hypoxia ([place]) 09/30/2019   Pleural effusion 09/30/2019   Chest pain 12/02/2018   Vascular dementia ([place]) 10/10/2017   Paroxysmal atrial flutter ([place]) 10/10/2017   Altered mental status, unspecified 10/09/2017   Other malaise 10/09/2017   S/P CABG x 4 10/04/2017   Cerebral embolism with cerebral infarction 09/20/2017   NSVT (nonsustained ventricular tachycardia) ([place])    Dyspnea 09/08/2017   Non-ST elevation MI (NSTEMI) ([place])    Acute on chronic combined systolic and diastolic heart failure ([place])  End-stage renal disease on hemodialysis ([place])    Left leg weakness    TIA (transient ischemic attack) 10/27/2016   Dependence on renal dialysis ([place]) 08/09/2015   History of CVA with residual deficit 08/07/2015   Seizure, late effect of stroke ([place]) 08/07/2015   Diabetic peripheral neuropathy ([place]) 08/07/2015   Unspecified protein-calorie malnutrition ([place]) 05/27/2015   Coagulation defect, unspecified ([place]) 07/16/2014   Type 2 diabetes mellitus with diabetic peripheral angiopathy without gangrene ([place]) 07/01/2014   Pruritus, unspecified 05/26/2014   Elevated prostate specific antigen (PSA) 08/03/2013   Encounter for immunization  07/10/2013   Obstructive sleep apnea 06/16/2013   Hyperlipidemia associated with type 2 diabetes mellitus ([place])    ESRD (end stage renal disease) ([place])    Erectile dysfunction associated with type 2 diabetes mellitus ([place])    Hypertension due to end stage renal disease caused by type 2 diabetes mellitus, on dialysis ([place])    COPD (chronic obstructive pulmonary disease) ([place])    Chronic combined systolic and diastolic CHF (congestive heart failure) ([place])    Anemia in chronic kidney disease 12/27/2011   Long term (current) use of insulin ([place]) 11/03/2009   Gout, unspecified 02/28/2009   Iron deficiency anemia, unspecified 10/30/2008   Secondary hyperparathyroidism of renal origin ([place]) 10/30/2008   Uncontrolled type 2 diabetes mellitus with end-stage renal disease ([place]) 06/08/1984    Past Surgical History:  Procedure Laterality Date   A/V FISTULAGRAM N/A 10/04/2017   Procedure: A/V FISTULAGRAM;  Surgeon: Elam Dutch, MD;  Location: [place] [facility];  Service: Cardiovascular;  Laterality: N/A;   A/V SHUNTOGRAM Left 07/12/2020   Procedure: A/V SHUNTOGRAM;  Surgeon: Serafina Mitchell, MD;  Location: [place] [facility];  Service: Cardiovascular;  Laterality: Left;   ANGIOPLASTY Left 09/24/2019   Procedure: Angioplasty Innominate Vien;  Surgeon: Serafina Mitchell, MD;  Location: [place];  Service: Vascular;  Laterality: Left;   AV FISTULA PLACEMENT Left 01/13/2013   Procedure: INSERTION OF ARTERIOVENOUS (AV) GORE-TEX GRAFT ARM;  Surgeon: Angelia Mould, MD;  Location: [place];  Service: Vascular;  Laterality: Left;   AV FISTULA PLACEMENT Left 05/05/2013   Procedure: INSERTION OF LEFT UPPER ARM  ARTERIOVENOUS GORTEX GRAFT;  Surgeon: Angelia Mould, MD;  Location: [place];  Service: Vascular;  Laterality: Left;   AV FISTULA PLACEMENT Left 11/26/2019   Procedure: INSERTION OF ARTERIOVENOUS (AV) GORE-TEX GRAFT, left  ARM;  Surgeon: Serafina Mitchell, MD;  Location: [place];  Service:  Vascular;  Laterality: Left;   [place] REMOVAL Left 03/26/2013   Procedure: REMOVAL OF  NON INCORPORATED ARTERIOVENOUS GORETEX GRAFT ([place]) left arm * repair of  left brachial artery with vein patch angioplasty.;  Surgeon: Angelia Mould, MD;  Location: [place];  Service: Vascular;  Laterality: Left;   CARDIAC CATHETERIZATION  August 26, 2012   CATARACT EXTRACTION W/ INTRAOCULAR LENS  IMPLANT, BILATERAL     COLONOSCOPY     CORONARY ARTERY BYPASS GRAFT N/A 09/13/2017   Procedure: CORONARY ARTERY BYPASS GRAFTING (CABG) times four LIMA  to LAD, SVG sequentially to OM and RAMUS Intermediate and SVG to Acute Marginal;  Surgeon: Melrose Nakayama, MD;  Location: [place];  Service: Open Heart Surgery;  Laterality: N/A;   DIALYSIS/PERMA CATHETER INSERTION N/A 02/28/2021   Procedure: DIALYSIS/PERMA CATHETER EXCHANGE;  Surgeon: Katha Cabal, MD;  Location: [place] [facility];  Service: Cardiovascular;  Laterality: N/A;   EMBOLECTOMY Right 08/27/2013   Procedure: EMBOLECTOMY;  Surgeon: Mal Misty,  MD;  Location: [place];  Service: Vascular;  Laterality: Right;  Thrombectomy of Radial and ulnar artery.   ENDOVASCULAR STENT INSERTION Left 09/24/2019   Procedure: Endovascular Stent Graft Insertion, Arm Graft;  Surgeon: Serafina Mitchell, MD;  Location: [facility] OR;  Service: Vascular;  Laterality: Left;   EYE SURGERY     laser.  and surgery for DM   fistula     RUE and wrist   FISTULOGRAM Left 09/24/2019   Procedure: SHUNTOGRAM OF ARTERIOVENOUS FISTULA LEFT ARM,;  Surgeon: Serafina Mitchell, MD;  Location: [place];  Service: Vascular;  Laterality: Left;   INSERTION OF DIALYSIS CATHETER Right 01/01/2013   Procedure: INSERTION OF DIALYSIS CATHETER right  internal jugular;  Surgeon: Serafina Mitchell, MD;  Location: [place];  Service: Vascular;  Laterality: Right;   INSERTION OF DIALYSIS CATHETER N/A 03/26/2013   Procedure: INSERTION OF DIALYSIS CATHETER Left internal jugular vein;  Surgeon: Angelia Mould, MD;  Location: [place];  Service: Vascular;  Laterality: N/A;   INSERTION OF DIALYSIS CATHETER Right 11/26/2019   Procedure: INSERTION OF DIALYSIS CATHETER, right internal jugular;  Surgeon: Serafina Mitchell, MD;  Location: [place];  Service: Vascular;  Laterality: Right;   IR THORACENTESIS ASP PLEURAL SPACE W/IMG GUIDE  09/30/2019   LEFT HEART CATH AND CORONARY ANGIOGRAPHY N/A 09/09/2017   Procedure: LEFT HEART CATH AND CORONARY ANGIOGRAPHY;  Surgeon: Troy Sine, MD;  Location: [place] [facility];  Service: Cardiovascular;  Laterality: N/A;   LIGATION OF ARTERIOVENOUS  FISTULA Right 12/30/2013   Procedure: REMOVAL OF SEGMENT OF GORTEX GRAFT AND FISTULA  AND REPAIR OF BRACHIAL ARTERY;  Surgeon: Mal Misty, MD;  Location: [place];  Service: Vascular;  Laterality: Right;   MITRAL VALVE REPAIR N/A 09/13/2017   Procedure: MITRAL VALVE REPAIR (MVR);  Surgeon: Melrose Nakayama, MD;  Location: [place];  Service: Open Heart Surgery;  Laterality: N/A;   MITRAL VALVE REPAIR (MV)/CORONARY ARTERY BYPASS GRAFTING (CABG)  09/13/2017   PENILE PROSTHESIS IMPLANT     1997.. no card   PERIPHERAL VASCULAR BALLOON ANGIOPLASTY Left 10/04/2017   Procedure: PERIPHERAL VASCULAR BALLOON ANGIOPLASTY;  Surgeon: Elam Dutch, MD;  Location: [place] [facility];  Service: Cardiovascular;  Laterality: Left;  AVF   PERIPHERAL VASCULAR BALLOON ANGIOPLASTY Left 07/12/2020   Procedure: PERIPHERAL VASCULAR BALLOON ANGIOPLASTY;  Surgeon: Serafina Mitchell, MD;  Location: [place] [facility];  Service: Cardiovascular;  Laterality: Left;  ARM SHUNTOGRAM   REVISION OF ARTERIOVENOUS GORETEX GRAFT Left 09/24/2019   Procedure: REVISION OF ARTERIOVENOUS GORETEX GRAFT LEFT ARM;  Surgeon: Serafina Mitchell, MD;  [facility];  Service: Vascular;  Laterality: Left;   TEE WITHOUT CARDIOVERSION N/A 06/11/2013   Procedure: TRANSESOPHAGEAL ECHOCARDIOGRAM (TEE);  Surgeon: Larey Dresser, MD;  Location: [facility] ENDOSCOPY;  Service:  Cardiovascular;  Laterality: N/A;   TEE WITHOUT CARDIOVERSION N/A 09/13/2017   Procedure: TRANSESOPHAGEAL ECHOCARDIOGRAM (TEE);  Surgeon: Melrose Nakayama, MD;  Location: [place];  Service: Open Heart Surgery;  Laterality: N/A;   THROMBECTOMY W/ EMBOLECTOMY Left 03/26/2013   Procedure: Attempted thrombectomy of left arm arteriovenous goretex graft.;  Surgeon: Angelia Mould, MD;  Location: [facility] OR;  Service: Vascular;  Laterality: Left;   ULTRASOUND GUIDANCE FOR VASCULAR ACCESS Bilateral 09/24/2019   Procedure: Ultrasound Guidance For Vascular Access, Right Femoral Vein and Left Arm Graft;  Surgeon: Serafina Mitchell, MD;  [facility] OR;  Service: Vascular;  Laterality: Bilateral;   VENOGRAM N/A  04/07/2013   Procedure: VENOGRAM;  Surgeon: Serafina Mitchell, MD;  Location: [facility] CATH LAB;  Service: Cardiovascular;  Laterality: N/A;    Prior to Admission medications   Medication Sig Start Date End Date Taking? Authorizing Provider  acetaminophen (TYLENOL) 500 MG tablet Take 1,000 mg by mouth every 6 (six) hours as needed for mild pain or moderate pain.    [provider]  allopurinol (ZYLOPRIM) 100 MG tablet Take 100 mg by mouth daily.    [provider]  amiodarone (PACERONE) 200 MG tablet Take 200 mg by mouth daily.    [provider]  aspirin EC 81 MG tablet Take 1 tablet (81 mg total) by mouth daily. 11/29/15   Rivet, Sindy Guadeloupe, MD  atorvastatin (LIPITOR) 40 MG tablet Take 40 mg by mouth at bedtime.    [provider]  cinacalcet (SENSIPAR) 30 MG tablet Take 30 mg by mouth every Monday, Wednesday, and Friday.    [provider]  cyproheptadine (PERIACTIN) 4 MG tablet Take 2 mg by mouth 3 (three) times daily with meals.     [provider]  docusate sodium (COLACE) 100 MG capsule Take 100 mg by mouth daily.    [provider]  ferric citrate (AURYXIA) 1 GM 210 MG(Fe) tablet Take 210 mg by mouth daily. (with snack)    [provider]  ferric citrate (AURYXIA) 1 GM 210 MG(Fe) tablet Take 420 mg by mouth 3 (three) times daily with meals.    [provider]  insulin detemir (LEVEMIR) 100 UNIT/ML injection Inject 0.2 mLs (20 Units total) into the skin 2 (two) times daily. Patient taking differently: Inject 17 Units into the skin at bedtime. 10/07/17   Gold, Wayne E, [name]  levETIRAcetam (KEPPRA) 500 MG tablet Take 500 mg by mouth See admin instructions. Take 1 tablet ('500mg'$ ) by mouth twice daily every Tuesday, Thursday, Saturday and Sunday    [provider]  levETIRAcetam (KEPPRA) 500 MG tablet Take 500 mg by mouth See admin instructions. Take 1 tablet ('500mg'$ ) by mouth three times daily on Monday, Wednesday and Friday    [provider]  linaclotide (LINZESS) 145 MCG CAPS capsule Take 145 mcg by mouth daily as needed (constipation).     [provider]  mirtazapine (REMERON) 7.5 MG tablet Take 7.5 mg by mouth at bedtime.     [provider]  Nutritional Supplements (FEEDING SUPPLEMENT, NEPRO CARB STEADY,) LIQD Take 237 mLs by mouth 2 (two) times daily between meals. Patient taking differently: Take 237 mLs by mouth daily as needed (nutritional supplement). 10/07/17   Gold, Wilder Glade, [name]  Polyethyl Glycol-Propyl Glycol 0.4-0.3 % SOLN Place 1 drop into both eyes 3 (three) times daily as needed (dry/irritated eyes.).    [provider]  polyethylene glycol (MIRALAX / GLYCOLAX) 17 g packet Take 17 g by mouth daily.    [provider]  senna-docusate (SENOKOT-S) 8.6-50 MG tablet Take 1 tablet by mouth at bedtime.    [provider]  sertraline (ZOLOFT) 25 MG tablet Take 25 mg by mouth at bedtime.    [provider]    Allergies Patient has no known allergies.  Family History  Problem Relation Age of Onset   Heart disease Father    Diabetes Sister    Diabetes Brother    Diabetes Son     Social History Social History   Tobacco Use    Smoking status: Former    Years: 7.00  Types: Cigarettes    Quit date: 06/10/1973    Years since quitting: 48.0   Smokeless tobacco: Never   Tobacco comments:    Quit in 1974.  Vaping Use   Vaping Use: Never used  Substance Use Topics   Alcohol use: No    Alcohol/week: 0.0 standard drinks   Drug use: No    Review of Systems  Constitutional: + fever. Eyes: Negative for visual changes. ENT: Negative for sore throat. Neck: No neck pain  Cardiovascular: Negative for chest pain. Respiratory: Negative for shortness of breath. Gastrointestinal: Negative for abdominal pain, vomiting or diarrhea. Genitourinary: Negative for dysuria. Musculoskeletal: Negative for back pain. + R foot pain Skin: Negative for rash. Neurological: Negative for headaches, weakness or numbness. Psych: No SI or HI  ____________________________________________   PHYSICAL EXAM:  VITAL SIGNS: ED Triage Vitals  Enc Vitals Group     BP 06/26/21 2310 (!) 121/57     Pulse Rate 06/26/21 2310 88     Resp 06/26/21 2310 18     Temp 06/26/21 2310 100.1 F (37.8 C)     Temp Source 06/26/21 2310 Oral     SpO2 06/26/21 2310 97 %     Weight 06/26/21 2311 180 lb 12.4 oz (82 kg)     Height 06/26/21 2311 '5\' 9"'$  (1.753 m)     Head Circumference --      Peak Flow --      Pain Score 06/26/21 2311 0     Pain Loc --      Pain Edu? --      Excl. in [place]? --     Constitutional: Alert and oriented. Well appearing and in no apparent distress. HEENT:      Head: Normocephalic and atraumatic.         Eyes: Conjunctivae are normal. Sclera is non-icteric.       Mouth/Throat: Mucous membranes are moist.       Neck: Supple with no signs of meningismus. Cardiovascular: Regular rate and rhythm. No murmurs, gallops, or rubs. 2+ symmetrical distal pulses are present in all extremities. No JVD. Respiratory: Hypoxic with normal work of breathing, on 4 L nasal cannula Gastrointestinal: Soft, non tender, and non  distended. Musculoskeletal: There is a large necrotic pressure ulcer of the right heel with surrounding erythema and warmth, no fluctuance or pus Neurologic: Normal speech and language. Face is symmetric. Moving all extremities. No gross focal neurologic deficits are appreciated. Skin: Skin is warm, dry and intact. No rash noted. Psychiatric: Mood and affect are normal. Speech and behavior are normal.  ____________________________________________   LABS (all labs ordered are listed, but only abnormal results are displayed)  Labs Reviewed  CBC WITH DIFFERENTIAL/PLATELET - Abnormal; Notable for the following components:      Result Value   WBC 19.6 (*)    RBC 3.17 (*)    Hemoglobin 9.1 (*)    HCT 28.3 (*)    Neutro Abs 18.6 (*)    Lymphs Abs 0.3 (*)    Abs Immature Granulocytes 0.12 (*)    All other components within normal limits  COMPREHENSIVE METABOLIC PANEL - Abnormal; Notable for the following components:   Potassium 5.3 (*)    Chloride 93 (*)    Glucose, Bld 330 (*)    BUN 62 (*)    Creatinine, Ser 7.02 (*)    Calcium 8.1 (*)    Albumin 2.4 (*)    GFR, Estimated 8 (*)    All other components  within normal limits  LACTIC ACID, PLASMA - Abnormal; Notable for the following components:   Lactic Acid, Venous 3.1 (*)    All other components within normal limits  BLOOD GAS, VENOUS - Abnormal; Notable for the following components:   pO2, Ven <31.0 (*)    Bicarbonate 32.8 (*)    Acid-Base Excess 5.3 (*)    All other components within normal limits  RESP PANEL BY RT-PCR (FLU A&B, COVID) ARPGX2  CULTURE, BLOOD (ROUTINE X 2)  CULTURE, BLOOD (ROUTINE X 2)  PROCALCITONIN  LACTIC ACID, PLASMA   ____________________________________________  EKG  none  ____________________________________________  RADIOLOGY  I have personally reviewed the images performed during this visit and I agree with the Radiologist's read.   Interpretation by Radiologist:  DG Chest Portable 1  View  Result Date: 06/27/2021 CLINICAL DATA:  Fever and pressure wounds with elevated white blood cell count. EXAM: PORTABLE CHEST 1 VIEW COMPARISON:  Feb 28, 2021 FINDINGS: Multiple sternal wires are seen. Chronic airspace disease is seen throughout the mid and lower left lung which is unchanged in appearance. A stable, chronic loculated left pleural effusion is noted. No pneumothorax is identified. Stable vascular crowding is seen along the infrahilar region. A stable coarsely calcified left thyroid nodule is seen. A femoral catheter is seen with its distal tip at the level of the diaphragm, to the right of midline. The heart size and mediastinal contours are within normal limits. Degenerative changes are seen within the thoracic spine. IMPRESSION: 1. Chronic airspace disease throughout the mid and lower left lung with a stable chronic loculated left pleural effusion. 2. No new or acute findings. Electronically Signed   By: Virgina Norfolk M.D.   On: 06/27/2021 00:28   DG Foot Complete Right  Result Date: 06/27/2021 CLINICAL DATA:  Fever and pressure wounds with elevated white blood cell count. EXAM: RIGHT FOOT COMPLETE - 3+ VIEW COMPARISON:  None. FINDINGS: There is no evidence of acute fracture or dislocation. Soft tissue ulcerations are seen along the right heel with adjacent area of cortical destruction noted along the right calcaneus. Marked severity vascular calcification is noted. IMPRESSION: Soft tissue ulcerations along the right heel with adjacent area of cortical destruction noted along the right calcaneus, suspicious for acute osteomyelitis. MRI correlation is recommended. Electronically Signed   By: Virgina Norfolk M.D.   On: 06/27/2021 00:30     ____________________________________________   PROCEDURES  Procedure(s) performed:yes .1-3 Lead EKG Interpretation Performed by: Rudene Re, MD Authorized by: Rudene Re, MD     Interpretation: non-specific     ECG rate  assessment: normal     Rhythm: sinus rhythm     Ectopy: none     Conduction: normal    Critical Care performed: yes  CRITICAL CARE Performed by: Rudene Re  ?  Total critical care time: 30 min  Critical care time was exclusive of separately billable procedures and treating other patients.  Critical care was necessary to treat or prevent imminent or life-threatening deterioration.  Critical care was time spent personally by me on the following activities: development of treatment plan with patient and/or surrogate as well as nursing, discussions with consultants, evaluation of patient's response to treatment, examination of patient, obtaining history from patient or surrogate, ordering and performing treatments and interventions, ordering and review of laboratory studies, ordering and review of radiographic studies, pulse oximetry and re-evaluation of patient's condition.  ____________________________________________   INITIAL IMPRESSION / ASSESSMENT AND PLAN / ED COURSE  75 y.o. male  with a history of vascular dementia, ESRD on HD (MWF), CHF, COPD, CAD, hepatitis C, seizure, hypertension, hyperlipidemia, diabetes who presents for evaluation of fever in the setting of a right heel pressure ulcer that looks infected.  Patient is also hypoxic but denies any chest pain or shortness of breath.  He is requiring 4 L of oxygen via nasal cannula which he does not usually require at baseline.  He does have cellulitis surrounding a necrotic ulcer of the right heel.  Temp here is 100.17F.  Review of records from the nursing home show the patient had an elevated white count of 21.  Concerning for sepsis.  Will get chest x-ray and heel x-ray, labs, COVID and flu swab.  We will start patient on Vanco and Rocephin.  Anticipate admission to the hospitalist service.  Patient placed on telemetry for monitoring of cardiorespiratory status.  _________________________ 2:07 AM on  06/27/2021 ----------------------------------------- XR of the foot concerning for acute osteomyelitis.  Chest x-ray showing volume overload.  COVID and flu negative.  Procalcitonin elevated and so his lactic of 3.1 and a white count of 19.6 concerning for sepsis.  Patient covered broadly with Zosyn and vancomycin.  Only 250 cc of fluid were given since patient is in end-stage renal disease on dialysis was already volume overloaded with edema and effusion and requiring 4 L of oxygen.  Will discuss with the hospitalist for admission.      _____________________________________________ Please note:  Patient was evaluated in Emergency Department today for the symptoms described in the history of present illness. Patient was evaluated in the context of the global COVID-19 pandemic, which necessitated consideration that the patient might be at risk for infection with the SARS-CoV-2 virus that causes COVID-19. Institutional protocols and algorithms that pertain to the evaluation of patients at risk for COVID-19 are in a state of rapid change based on information released by regulatory bodies including the CDC and federal and state organizations. These policies and algorithms were followed during the patient's care in the ED.  Some ED evaluations and interventions may be delayed as a result of limited staffing during the pandemic.   [place] Controlled Substance Database was reviewed by me. ____________________________________________   FINAL CLINICAL IMPRESSION(S) / ED DIAGNOSES   Final diagnoses:  Fever, unspecified fever cause  Osteomyelitis of right foot, unspecified type (HCC)  Acute respiratory failure with hypoxia (HCC)  Hypervolemia, unspecified hypervolemia type  Sepsis, due to unspecified organism, unspecified whether acute organ dysfunction present [facility])      NEW MEDICATIONS STARTED DURING THIS VISIT:  ED Discharge Orders     None        Note:  This document was prepared using  Dragon voice recognition software and may include unintentional dictation errors.    Alfred Levins, [place], MD 06/27/21 [place], [place], [place] 06/27/21 [phone]

## 2021-06-27 NOTE — H&P (Addendum)
[place]   PATIENT NAME: Ernest [name]    MR#:  [id number]  DATE OF BIRTH:  May 01, 1946  DATE OF ADMISSION:  06/26/2021  PRIMARY CARE PHYSICIAN: Patient, [name] (Inactive)   Patient is coming from: SNF.  REQUESTING/REFERRING PHYSICIAN: Rudene Re, MD  CHIEF COMPLAINT:   Chief Complaint  Patient presents with  . Fever    HISTORY OF PRESENT ILLNESS:  Ernest [name] is a 75 y.o. African-American male with medical history significant for end-stage renal disease on hemodialysis Monday Wednesday and Friday, chronic systolic CHF, COPD, coronary artery disease, DVT, GERD, gout, hepatitis C, hypertension, dyslipidemia and CVA, who presented to emergency room with acute onset of fever with associated chills that started today at the skilled nursing facility.  The patient has a right heel decubitus ulcer for which has been getting oral antibiotics however the wound has been getting progressively worse.  His right heel pain has been sharp and throbbing.  He was noted to have leukocytosis at his nursing home.  His roommate tested positive for COVID-19 however the patient tested negative there.  He denies any cough or wheezing or dyspnea or chest pain or palpitations.  He was noted to be hypoxic however.  No nausea or vomiting or abdominal pain.  ED Course: Upon presentation to the ER, temperature 100.1 and pulse ox 90 was 97% on 4 L of O2 by nasal cannula, vital signs otherwise within normal.  Later on respiratory rate was up to 26.  Labs revealed mild hyperkalemia 5.3 and hyperglycemia 330 with a BUN of 62 and creatinine of 7.02 and albumin 2.4.  Lactic acid 3.1 and later 2.9 with procalcitonin of 13.2.  CBC showed leukocytosis 19.6 with neutrophilia and anemia.  INR was 1.4 with PT of 16.7 and PTT 34.  Influenza antigens and COVID-19 PCR came back negative.  Blood cultures were drawn.   EKG as reviewed by me : EKG showed normal sinus rhythm with a rate of 80 with prolonged PR  interval left anterior fascicular block and abnormal R wave progression with T wave inversion laterally. Imaging: Portable chest x-ray showed chronic airspace disease throughout the mid and lower left lung with a stable chronic loculated left pleural effusion with no new acute findings. Right foot x-ray revealed soft tissue ulceration along the right heel with adjacent area of cortical destruction noted in the right calcaneus suspicious for acute osteomyelitis.  The patient was given 2050 mL IV lactated ringer bolus, IV Zosyn and vancomycin.  He will be admitted to a med monitored bed for further evaluation and management.  PAST MEDICAL HISTORY:   Past Medical History:  Diagnosis Date  . Abnormal stress test    s/p cath November 2013 with modest disease involving the ostial left main, proximal LAD, proximal RCA - do not appear to be hemodynamically signficant; mild LV dysfunction  . Anemia    low iron  . Anxiety   . Arthritis   . Bacteremia   . Chronic systolic CHF (congestive heart failure) ([place])   . COPD (chronic obstructive pulmonary disease) ([place])   . Coronary artery disease    a. per cath report 2013, b. 09/13/2017-CABG X4 & Mitral valve repair  . Depression   . DVT (deep venous thrombosis) ([facility])    arm - right- finished blood thinner- not sure when it  . Ejection fraction < 50%   . Erectile dysfunction    penile implant  . ESRD (end stage renal disease) (  [place])    [place] on a MWF schedule.     . Gout    Hx: of  . Hepatitis C   . History of seizures    last one 2018  . Hyperlipidemia   . Hypertension   . Mitral valve disease    s/p Mitral repair 09/13/17  . Obesity   . Retinopathy   . Shortness of breath    Hx: of with exertion  . Stroke [facility])    "several" left side weakness, speech affected  . Type II or unspecified type diabetes mellitus without mention of complication, not stated as uncontrolled    adult onset    PAST SURGICAL HISTORY:   Past Surgical  History:  Procedure Laterality Date  . A/V FISTULAGRAM N/A 10/04/2017   Procedure: A/V FISTULAGRAM;  Surgeon: Elam Dutch, MD;  Location: [place] [facility];  Service: Cardiovascular;  Laterality: N/A;  . A/V SHUNTOGRAM Left 07/12/2020   Procedure: A/V SHUNTOGRAM;  Surgeon: Serafina Mitchell, MD;  Location: [place] [facility];  Service: Cardiovascular;  Laterality: Left;  . ANGIOPLASTY Left 09/24/2019   Procedure: Angioplasty Innominate Vien;  Surgeon: Serafina Mitchell, MD;  Location: [place];  Service: Vascular;  Laterality: Left;  . AV FISTULA PLACEMENT Left 01/13/2013   Procedure: INSERTION OF ARTERIOVENOUS (AV) GORE-TEX GRAFT ARM;  Surgeon: Angelia Mould, MD;  Location: [place];  Service: Vascular;  Laterality: Left;  . AV FISTULA PLACEMENT Left 05/05/2013   Procedure: INSERTION OF LEFT UPPER ARM  ARTERIOVENOUS GORTEX GRAFT;  Surgeon: Angelia Mould, MD;  Location: [facility] OR;  Service: Vascular;  Laterality: Left;  . AV FISTULA PLACEMENT Left 11/26/2019   Procedure: INSERTION OF ARTERIOVENOUS (AV) GORE-TEX GRAFT, left  ARM;  Surgeon: Serafina Mitchell, MD;  Location: [place];  Service: Vascular;  Laterality: Left;  . [place] REMOVAL Left 03/26/2013   Procedure: REMOVAL OF  NON INCORPORATED ARTERIOVENOUS GORETEX GRAFT ([place]) left arm * repair of  left brachial artery with vein patch angioplasty.;  Surgeon: Angelia Mould, MD;  Location: [place];  Service: Vascular;  Laterality: Left;  . CARDIAC CATHETERIZATION  August 26, 2012  . CATARACT EXTRACTION W/ INTRAOCULAR LENS  IMPLANT, BILATERAL    . COLONOSCOPY    . CORONARY ARTERY BYPASS GRAFT N/A 09/13/2017   Procedure: CORONARY ARTERY BYPASS GRAFTING (CABG) times four LIMA  to LAD, SVG sequentially to OM and RAMUS Intermediate and SVG to Acute Marginal;  Surgeon: Melrose Nakayama, MD;  Location: [place];  Service: Open Heart Surgery;  Laterality: N/A;  . DIALYSIS/PERMA CATHETER INSERTION N/A 02/28/2021   Procedure: DIALYSIS/PERMA  CATHETER EXCHANGE;  Surgeon: Katha Cabal, MD;  Location: [place] CV LAB;  Service: Cardiovascular;  Laterality: N/A;  . EMBOLECTOMY Right 08/27/2013   Procedure: EMBOLECTOMY;  Surgeon: Mal Misty, MD;  Location: [place];  Service: Vascular;  Laterality: Right;  Thrombectomy of Radial and ulnar artery.  . ENDOVASCULAR STENT INSERTION Left 09/24/2019   Procedure: Endovascular Stent Graft Insertion, Arm Graft;  Surgeon: Serafina Mitchell, MD;  Location: [place];  Service: Vascular;  Laterality: Left;  . EYE SURGERY     laser.  and surgery for DM  . fistula     RUE and wrist  . FISTULOGRAM Left 09/24/2019   Procedure: SHUNTOGRAM OF ARTERIOVENOUS FISTULA LEFT ARM,;  Surgeon: Serafina Mitchell, MD;  Location: [place];  Service: Vascular;  Laterality: Left;  . INSERTION OF DIALYSIS CATHETER Right 01/01/2013  Procedure: INSERTION OF DIALYSIS CATHETER right  internal jugular;  Surgeon: Serafina Mitchell, MD;  Location: [place];  Service: Vascular;  Laterality: Right;  . INSERTION OF DIALYSIS CATHETER N/A 03/26/2013   Procedure: INSERTION OF DIALYSIS CATHETER Left internal jugular vein;  Surgeon: Angelia Mould, MD;  Location: [place];  Service: Vascular;  Laterality: N/A;  . INSERTION OF DIALYSIS CATHETER Right 11/26/2019   Procedure: INSERTION OF DIALYSIS CATHETER, right internal jugular;  Surgeon: Serafina Mitchell, MD;  Location: [place];  Service: Vascular;  Laterality: Right;  . IR THORACENTESIS ASP PLEURAL SPACE W/IMG GUIDE  09/30/2019  . LEFT HEART CATH AND CORONARY ANGIOGRAPHY N/A 09/09/2017   Procedure: LEFT HEART CATH AND CORONARY ANGIOGRAPHY;  Surgeon: [place] Sine, MD;  Location: [place] CV LAB;  Service: Cardiovascular;  Laterality: N/A;  . LIGATION OF ARTERIOVENOUS  FISTULA Right 12/30/2013   Procedure: REMOVAL OF SEGMENT OF GORTEX GRAFT AND FISTULA  AND REPAIR OF BRACHIAL ARTERY;  Surgeon: Mal Misty, MD;  Location: [place];  Service: Vascular;  Laterality: Right;  . MITRAL  VALVE REPAIR N/A 09/13/2017   Procedure: MITRAL VALVE REPAIR (MVR);  Surgeon: Melrose Nakayama, MD;  Location: [place];  Service: Open Heart Surgery;  Laterality: N/A;  . MITRAL VALVE REPAIR (MV)/CORONARY ARTERY BYPASS GRAFTING (CABG)  09/13/2017  . PENILE PROSTHESIS IMPLANT     1997.. no card  . PERIPHERAL VASCULAR BALLOON ANGIOPLASTY Left 10/04/2017   Procedure: PERIPHERAL VASCULAR BALLOON ANGIOPLASTY;  Surgeon: Elam Dutch, MD;  Location: [place] CV LAB;  Service: Cardiovascular;  Laterality: Left;  AVF  . PERIPHERAL VASCULAR BALLOON ANGIOPLASTY Left 07/12/2020   Procedure: PERIPHERAL VASCULAR BALLOON ANGIOPLASTY;  Surgeon: Serafina Mitchell, MD;  Location: [place] CV LAB;  Service: Cardiovascular;  Laterality: Left;  ARM SHUNTOGRAM  . REVISION OF ARTERIOVENOUS GORETEX GRAFT Left 09/24/2019   Procedure: REVISION OF ARTERIOVENOUS GORETEX GRAFT LEFT ARM;  Surgeon: Serafina Mitchell, MD;  Location: [place];  Service: Vascular;  Laterality: Left;  . TEE WITHOUT CARDIOVERSION N/A 06/11/2013   Procedure: TRANSESOPHAGEAL ECHOCARDIOGRAM (TEE);  Surgeon: Larey Dresser, MD;  Location: [facility] ENDOSCOPY;  Service: Cardiovascular;  Laterality: N/A;  . TEE WITHOUT CARDIOVERSION N/A 09/13/2017   Procedure: TRANSESOPHAGEAL ECHOCARDIOGRAM (TEE);  Surgeon: Melrose Nakayama, MD;  Location: [place];  Service: Open Heart Surgery;  Laterality: N/A;  . THROMBECTOMY W/ EMBOLECTOMY Left 03/26/2013   Procedure: Attempted thrombectomy of left arm arteriovenous goretex graft.;  Surgeon: Angelia Mould, MD;  Location: [place];  Service: Vascular;  Laterality: Left;  . ULTRASOUND GUIDANCE FOR VASCULAR ACCESS Bilateral 09/24/2019   Procedure: Ultrasound Guidance For Vascular Access, Right Femoral Vein and Left Arm Graft;  Surgeon: Serafina Mitchell, MD;  Location: [facility] OR;  Service: Vascular;  Laterality: Bilateral;  . VENOGRAM N/A 04/07/2013   Procedure: VENOGRAM;  Surgeon: Serafina Mitchell, MD;  Location[facility]  CATH LAB;  Service: Cardiovascular;  Laterality: N/A;    SOCIAL HISTORY:   Social History   Tobacco Use  . Smoking status: Former    Years: 7.00    Types: Cigarettes    Quit date: 06/10/1973    Years since quitting: 48.0  . Smokeless tobacco: Never  . Tobacco comments:    Quit in 1974.  Substance Use Topics  . Alcohol use: No    Alcohol/week: 0.0 standard drinks    FAMILY HISTORY:   Family History  Problem Relation Age of Onset  . Heart disease Father   .  Diabetes Sister   . Diabetes Brother   . Diabetes Son     DRUG ALLERGIES:  [name]  REVIEW OF SYSTEMS:   ROS As per history of present illness. All pertinent systems were reviewed above. Constitutional, HEENT, cardiovascular, respiratory, GI, GU, musculoskeletal, neuro, psychiatric, endocrine, integumentary and hematologic systems were reviewed and are otherwise negative/unremarkable except for positive findings mentioned above in the HPI.   MEDICATIONS AT HOME:   Prior to Admission medications   Medication Sig Start Date End Date Taking? Authorizing Provider  acetaminophen (TYLENOL) 500 MG tablet Take 1,000 mg by mouth every 6 (six) hours as needed for mild pain or moderate pain.    [provider]  allopurinol (ZYLOPRIM) 100 MG tablet Take 100 mg by mouth daily.    [provider]  amiodarone (PACERONE) 200 MG tablet Take 200 mg by mouth daily.    [provider]  aspirin EC 81 MG tablet Take 1 tablet (81 mg total) by mouth daily. 11/29/15   Rivet, Sindy Guadeloupe, MD  atorvastatin (LIPITOR) 40 MG tablet Take 40 mg by mouth at bedtime.    [provider]  cinacalcet (SENSIPAR) 30 MG tablet Take 30 mg by mouth every Monday, Wednesday, and Friday.    [provider]  cyproheptadine (PERIACTIN) 4 MG tablet Take 2 mg by mouth 3 (three) times daily with meals.     [provider]  docusate sodium (COLACE) 100 MG capsule Take 100 mg by mouth daily.    [provider]  ferric citrate (AURYXIA) 1 GM 210 MG(Fe) tablet Take 210 mg by mouth daily. (with snack)    [provider]  ferric citrate (AURYXIA) 1 GM 210 MG(Fe) tablet Take 420 mg by mouth 3 (three) times daily with meals.    [provider]  insulin detemir (LEVEMIR) 100 UNIT/ML injection Inject 0.2 mLs (20 Units total) into the skin 2 (two) times daily. Patient taking differently: Inject 17 Units into the skin at bedtime. 10/07/17   Gold, Wayne E, [name]  levETIRAcetam (KEPPRA) 500 MG tablet Take 500 mg by mouth See admin instructions. Take 1 tablet ('500mg'$ ) by mouth twice daily every Tuesday, Thursday, Saturday and Sunday    [provider]  levETIRAcetam (KEPPRA) 500 MG tablet Take 500 mg by mouth See admin instructions. Take 1 tablet ('500mg'$ ) by mouth three times daily on Monday, Wednesday and Friday    [provider]  linaclotide (LINZESS) 145 MCG CAPS capsule Take 145 mcg by mouth daily as needed (constipation).     [provider]  mirtazapine (REMERON) 7.5 MG tablet Take 7.5 mg by mouth at bedtime.     [provider]  Nutritional Supplements (FEEDING SUPPLEMENT, NEPRO CARB STEADY,) LIQD Take 237 mLs by mouth 2 (two) times daily between meals. Patient taking differently: Take 237 mLs by mouth daily as needed (nutritional supplement). 10/07/17   Gold, Wilder Glade, [name]  Polyethyl Glycol-Propyl Glycol 0.4-0.3 % SOLN Place 1 drop into both eyes 3 (three) times daily as needed (dry/irritated eyes.).    [provider]  polyethylene glycol (MIRALAX / GLYCOLAX) 17 g packet Take 17 g by mouth daily.    [provider]  senna-docusate (SENOKOT-S) 8.6-50 MG tablet Take 1 tablet by mouth at bedtime.    [provider]  sertraline (ZOLOFT) 25 MG tablet Take 25 mg by mouth at bedtime.    [provider]      VITAL SIGNS:  Blood pressure 117/62, pulse  79, temperature 100.1 F (37.8 C), temperature source Oral,  resp. rate 19, height '5\' 9"'$  (1.753 m), weight 82 kg, SpO2 100 %.  PHYSICAL EXAMINATION:  Physical Exam  GENERAL:  75 y.o.-year-old African-American male patient lying in the bed with minimal respiratory distress with conversational dyspnea. EYES: Pupils equal, round, reactive to light and accommodation. No scleral icterus. Extraocular muscles intact.  HEENT: Head atraumatic, normocephalic. Oropharynx and nasopharynx clear.  NECK:  Supple, no jugular venous distention. No thyroid enlargement, no tenderness.  LUNGS: Diminished bibasal breath sounds with bibasal rales. CARDIOVASCULAR: Regular rate and rhythm, S1, S2 normal. No murmurs, rubs, or gallops.  ABDOMEN: Soft, nondistended, nontender. Bowel sounds present. No organomegaly or mass.  EXTREMITIES: No pedal edema, cyanosis, or clubbing.  NEUROLOGIC: Cranial nerves II through XII are intact. Muscle strength 5/5 in all extremities. Sensation intact. Gait not checked.  PSYCHIATRIC: The patient is alert and oriented x 3.  Normal affect and good eye contact. SKIN: Right heel pending stage IV decubitus necrotic ulcer with black eschar and surrounding erythema with warmth and softening and tenderness with no fluctuation.    LABORATORY PANEL:   CBC Recent Labs  Lab 06/27/21 0042  WBC 19.6*  HGB 9.1*  HCT 28.3*  PLT 272   ------------------------------------------------------------------------------------------------------------------  Chemistries  Recent Labs  Lab 06/27/21 0042  NA 135  K 5.3*  CL 93*  CO2 28  GLUCOSE 330*  BUN 62*  CREATININE 7.02*  CALCIUM 8.1*  AST 16  ALT 14  ALKPHOS 110  BILITOT 0.6   ------------------------------------------------------------------------------------------------------------------  Cardiac Enzymes No results for input(s): TROPONINI in the last 168 hours. ------------------------------------------------------------------------------------------------------------------  RADIOLOGY:   DG Chest Portable 1 View  Result Date: 06/27/2021 CLINICAL DATA:  Fever and pressure wounds with elevated white blood cell count. EXAM: PORTABLE CHEST 1 VIEW COMPARISON:  Feb 28, 2021 FINDINGS: Multiple sternal wires are seen. Chronic airspace disease is seen throughout the mid and lower left lung which is unchanged in appearance. A stable, chronic loculated left pleural effusion is noted. No pneumothorax is identified. Stable vascular crowding is seen along the infrahilar region. A stable coarsely calcified left thyroid nodule is seen. A femoral catheter is seen with its distal tip at the level of the diaphragm, to the right of midline. The heart size and mediastinal contours are within normal limits. Degenerative changes are seen within the thoracic spine. IMPRESSION: 1. Chronic airspace disease throughout the mid and lower left lung with a stable chronic loculated left pleural effusion. 2. No new or acute findings. Electronically Signed   By: Virgina Norfolk M.D.   On: 06/27/2021 00:28   DG Foot Complete Right  Result Date: 06/27/2021 CLINICAL DATA:  Fever and pressure wounds with elevated white blood cell count. EXAM: RIGHT FOOT COMPLETE - 3+ VIEW COMPARISON:  None. FINDINGS: There is no evidence of acute fracture or dislocation. Soft tissue ulcerations are seen along the right heel with adjacent area of cortical destruction noted along the right calcaneus. Marked severity vascular calcification is noted. IMPRESSION: Soft tissue ulcerations along the right heel with adjacent area of cortical destruction noted along the right calcaneus, suspicious for acute osteomyelitis. MRI correlation is recommended. Electronically Signed   By: Virgina Norfolk M.D.   On: 06/27/2021 00:30      IMPRESSION AND PLAN:  Active Problems:   Sepsis ([place])  1.  Sepsis secondary to right heel cellulitis and ulcer with associated calcaneal osteomyelitis.  Sepsis is manifested by leukocytosis and tachypnea.  The patient  meets criteria for severe sepsis based on elevated lactic acid of 3.1. - The patient be admitted to a medical monitored bed. - We will continue antibiotic therapy with IV vancomycin, cefepime and Flagyl. - We will follow blood and wound cultures. - Pain management will be provided. - Aggressive hydration is contraindicated due to volume overload with end-stage renal disease and acute CHF. - Podiatry consult will be obtained. - I notified Dr. Vickki Muff about patient.  Will defer right foot MRI decision to him.  2.  End-stage renal disease on hemodialysis with mild fluid overload and subsequent mild acute on chronic systolic CHF and acute hypoxic respiratory failure. - We will place him on Lasix diuresis for pulmonary congestion. - Nephrology consult to be obtained for hemodialysis. - I notified Dr. Juleen China about the patient. - The patient has not had any alcohol in a long time.  We will obtain an echo while he is here for further assessment of his EF and diastolic function. - O2 protocol will be followed.  3.  Uncontrolled type 2 diabetes mellitus with hyperglycemia. - The patient will be placed on supplement coverage with NovoLog. - We will continue to basal coverage.  4.  Paroxysmal atrial fibrillation. - We will continue amiodarone.  5.  History of seizures. - We will continue his Keppra.  6.  Gout. - We will continue allopurinol.  7.  IBS. - We will continue Linzess.  8.  Depression. - We will continue Zoloft.  DVT prophylaxis: Lovenox renal dose. Code Status: full code. Family Communication:  The plan of care was discussed in details with the patient (and family). I answered all questions. The patient agreed to proceed with the above mentioned plan. Further management will depend upon hospital course. Disposition Plan: Back to previous home environment Consults called: Podiatry and nephrology. All the records are reviewed and case discussed with ED provider.  Status is:  Inpatient  Remains inpatient appropriate because:Ongoing active pain requiring inpatient pain management, Ongoing diagnostic testing needed not appropriate for outpatient work up, Unsafe d/c plan, IV treatments appropriate due to intensity of illness or inability to take PO, and Inpatient level of care appropriate due to severity of illness  Dispo: The patient is from: SNF              Anticipated d/c is to: SNF              Patient currently is not medically stable to d/c.   Difficult to place patient No  TOTAL TIME TAKING CARE OF THIS PATIENT: 60 minutes.    Christel Mormon M.D on 06/27/2021 at 2:50 AM  [facility]   From 7 PM-7 AM, contact night-coverage [email or web address]  CC: Primary care physician; Patient, [name] (Inactive)

## 2021-06-27 NOTE — Progress Notes (Signed)
Inpatient Diabetes Program Recommendations  AACE/ADA: New Consensus Statement on Inpatient Glycemic Control (2015)  Target Ranges:  Prepandial:   less than 140 mg/dL      Peak postprandial:   less than 180 mg/dL (1-2 hours)      Critically ill patients:  140 - 180 mg/dL   Lab Results  Component Value Date   GLUCAP 99 02/28/2021   HGBA1C 6.2 12/11/2017    Review of Glycemic Control Results for Ernest [name], Ernest [name] (MRN [id number]) as of 06/27/2021 10:50  Ref. Range 06/27/2021 00:42 06/27/2021 06:11  Glucose Latest Ref Range: 70 - 99 mg/dL 330 (H) 296 (H)   Diabetes history: DM 2 Outpatient Diabetes medications: Levemir 17 units qhs Current orders for Inpatient glycemic control:  Levemir 17 units ordered this am  Inpatient Diabetes Program Recommendations:    - Add Novolog 0-6 units tid + hs.  Thanks,  Tama Headings RN, MSN, BC-ADM Inpatient Diabetes Coordinator Team Pager [phone] (8a-5p)

## 2021-06-27 NOTE — Consult Note (Signed)
[facility]  CONSULT NOTE    Date: 06/27/2021                  Patient Name:  Ernest [name]  MRN: [id number]  DOB: [birth year]  Age / Sex: 75 y.o., male         PCP: Patient, [name] (Inactive)                 Service Requesting Consult: TRH                 Reason for Consult: End stage renal disease            History of Present Illness: Ernest [name] is a 75 y.o.  male with systolic congestive heart failure, COPD, CAD, GERD, Hepatitis C, hypertension, dyslipidemia and end stage renal disease on dialysis, who was admitted to [facility] on 06/26/2021 for Acute respiratory failure with hypoxia ([place]) [J96.01] Sepsis ([place]) [A41.9] Fever, unspecified fever cause [R50.9] Hypervolemia, unspecified hypervolemia type [E87.70] Osteomyelitis of right foot, unspecified type ([place]) [M86.9] Sepsis, due to unspecified organism, unspecified whether acute organ dysfunction present [facility]) [A41.9]  Patient currently received dialysis at [address], supervised by [employer] He is a resident at [facility] and states he was brought to the ED after getting sick from his room mate. He states he has had a fever, shortness of breath and generalized malaise. He reports attending all dialysis sessions. He is also found to have a right heel wound he currently receives antibiotics for. On arrival, labs indicate potassium 5.3, , BUN  62, and Glucose 330. Covid  and influenza swabs negative.   We have been consulted to manage dialysis needs during admission    Medications: Outpatient medications: Facility-Administered Medications Prior to Admission  Medication Dose Route Frequency Provider Last Rate Last Admin   0.9 %  sodium chloride infusion  250 mL Intravenous PRN Serafina Mitchell, MD       Medications Prior to Admission  Medication Sig Dispense Refill Last Dose   acetaminophen (TYLENOL) 500 MG tablet Take 1,000 mg by mouth every 6 (six) hours as needed for mild  pain or moderate pain.      allopurinol (ZYLOPRIM) 100 MG tablet Take 100 mg by mouth daily.      amiodarone (PACERONE) 200 MG tablet Take 200 mg by mouth daily.      aspirin EC 81 MG tablet Take 1 tablet (81 mg total) by mouth daily. 30 tablet 11    atorvastatin (LIPITOR) 40 MG tablet Take 40 mg by mouth at bedtime.      cinacalcet (SENSIPAR) 30 MG tablet Take 30 mg by mouth every Monday, Wednesday, and Friday.      cyproheptadine (PERIACTIN) 4 MG tablet Take 2 mg by mouth 3 (three) times daily with meals.       docusate sodium (COLACE) 100 MG capsule Take 100 mg by mouth daily.      insulin detemir (LEVEMIR) 100 UNIT/ML injection Inject 0.2 mLs (20 Units total) into the skin 2 (two) times daily. (Patient taking differently: Inject 17 Units into the skin at bedtime.) 10 mL 11    levETIRAcetam (KEPPRA) 500 MG tablet Take 500 mg by mouth See admin instructions. Take 1 tablet ('500mg'$ ) by mouth twice daily every Tuesday, Thursday, Saturday and Sunday      levETIRAcetam (KEPPRA) 500 MG tablet Take 500 mg by mouth See admin instructions. Take 1 tablet ('500mg'$ ) by mouth three times  daily on Monday, Wednesday and Friday      linaclotide (LINZESS) 145 MCG CAPS capsule Take 145 mcg by mouth daily as needed (constipation).       mirtazapine (REMERON) 7.5 MG tablet Take 7.5 mg by mouth at bedtime.       Nutritional Supplements (FEEDING SUPPLEMENT, NEPRO CARB STEADY,) LIQD Take 237 mLs by mouth 2 (two) times daily between meals. (Patient taking differently: Take 237 mLs by mouth daily as needed (nutritional supplement).)  0    Polyethyl Glycol-Propyl Glycol 0.4-0.3 % SOLN Place 1 drop into both eyes 3 (three) times daily as needed (dry/irritated eyes.).      polyethylene glycol (MIRALAX / GLYCOLAX) 17 g packet Take 17 g by mouth daily.      senna-docusate (SENOKOT-S) 8.6-50 MG tablet Take 1 tablet by mouth at bedtime.      sertraline (ZOLOFT) 25 MG tablet Take 25 mg by mouth at bedtime.       Current  medications: Current Facility-Administered Medications  Medication Dose Route Frequency Provider Last Rate Last Admin   acetaminophen (TYLENOL) tablet 650 mg  650 mg Oral Q6H PRN Mansy, Jan A, MD       Or   acetaminophen (TYLENOL) suppository 650 mg  650 mg Rectal Q6H PRN Mansy, Jan A, MD       allopurinol (ZYLOPRIM) tablet 100 mg  100 mg Oral Daily Mansy, Jan A, MD   100 mg at 06/27/21 0834   amiodarone (PACERONE) tablet 200 mg  200 mg Oral Daily Mansy, Jan A, MD   200 mg at 06/27/21 0837   atorvastatin (LIPITOR) tablet 40 mg  40 mg Oral Daily Mansy, Jan A, MD   40 mg at 06/27/21 0835   ceFEPIme (MAXIPIME) 1 g in sodium chloride 0.9 % 100 mL IVPB  1 g Intravenous Q24H Rauer, Samantha O, RPH       docusate sodium (COLACE) capsule 100 mg  100 mg Oral Daily Mansy, Jan A, MD   100 mg at 06/27/21 0839   feeding supplement (NEPRO CARB STEADY) liquid 237 mL  237 mL Oral Daily PRN Mansy, Jan A, MD       furosemide (LASIX) injection 60 mg  60 mg Intravenous Q12H Mansy, Jan A, MD       heparin injection 5,000 Units  5,000 Units Subcutaneous Q8H Mansy, Jan A, MD   5,000 Units at 06/27/21 [id number]   insulin detemir (LEVEMIR) injection 17 Units  17 Units Subcutaneous QHS Mansy, Jan A, MD       levETIRAcetam (KEPPRA) tablet 500 mg  500 mg Oral BID Mansy, Jan A, MD   500 mg at 06/27/21 0837   [START ON 06/28/2021] levETIRAcetam (KEPPRA) tablet 500 mg  500 mg Oral Once per day on Mon Wed Fri Mansy, Jan A, MD       metroNIDAZOLE (FLAGYL) IVPB 500 mg  500 mg Intravenous Q12H Mansy, Jan A, MD 100 mL/hr at 06/27/21 1120 500 mg at 06/27/21 1120   mirtazapine (REMERON) tablet 7.5 mg  7.5 mg Oral QHS Mansy, Jan A, MD       morphine 2 MG/ML injection 2 mg  2 mg Intravenous Q4H PRN Mansy, Jan A, MD       ondansetron [facility]) tablet 4 mg  4 mg Oral Q6H PRN Mansy, Jan A, MD       Or   ondansetron [facility]) injection 4 mg  4 mg Intravenous Q6H PRN Mansy, Arvella Merles, MD  polyethylene glycol (MIRALAX / GLYCOLAX) packet 17 g  17  g Oral Daily Mansy, Jan A, MD   17 g at 06/27/21 0840   senna-docusate (Senokot-S) tablet 1 tablet  1 tablet Oral QHS Mansy, Jan A, MD       sertraline (ZOLOFT) tablet 25 mg  25 mg Oral QHS Mansy, Jan A, MD       sevelamer carbonate (RENVELA) tablet 800 mg  800 mg Oral TID WC Ralene Muskrat B, MD       traZODone (DESYREL) tablet 25 mg  25 mg Oral QHS PRN Mansy, Jan A, MD       vancomycin (VANCOCIN) IVPB 1000 mg/200 mL premix  1,000 mg Intravenous Q dialysis Dorothe Pea, RPH       [START ON 06/28/2021] vancomycin (VANCOCIN) IVPB 1000 mg/200 mL premix  1,000 mg Intravenous Q M,W,F-HD Rauer, Forde Dandy, RPH          Allergies: [name]    Past Medical History: Past Medical History:  Diagnosis Date   Abnormal stress test    s/p cath November 2013 with modest disease involving the ostial left main, proximal LAD, proximal RCA - do not appear to be hemodynamically signficant; mild LV dysfunction   Anemia    low iron   Anxiety    Arthritis    Bacteremia    Chronic systolic CHF (congestive heart failure) (HCC)    COPD (chronic obstructive pulmonary disease) ([place])    Coronary artery disease    a. per cath report 2013, b. 09/13/2017-CABG X4 & Mitral valve repair   Depression    DVT (deep venous thrombosis) (HCC)    arm - right- finished blood thinner- not sure when it   Ejection fraction < 50%    Erectile dysfunction    penile implant   ESRD (end stage renal disease) ([place])    [facility] on a MWF schedule.      Gout    Hx: of   Hepatitis C    History of seizures    last one 2018   Hyperlipidemia    Hypertension    Mitral valve disease    s/p Mitral repair 09/13/17   Obesity    Retinopathy    Shortness of breath    Hx: of with exertion   Stroke [facility])    "several" left side weakness, speech affected   Type II or unspecified type diabetes mellitus without mention of complication, not stated as uncontrolled    adult onset     Past Surgical History: Past  Surgical History:  Procedure Laterality Date   A/V FISTULAGRAM N/A 10/04/2017   Procedure: A/V FISTULAGRAM;  Surgeon: Elam Dutch, MD;  Location: [place] CV [facility];  Service: Cardiovascular;  Laterality: N/A;   A/V SHUNTOGRAM Left 07/12/2020   Procedure: A/V SHUNTOGRAM;  Surgeon: Serafina Mitchell, MD;  Location: [place] [facility];  Service: Cardiovascular;  Laterality: Left;   ANGIOPLASTY Left 09/24/2019   Procedure: Angioplasty Innominate Vien;  Surgeon: Serafina Mitchell, MD;  Location: [place];  Service: Vascular;  Laterality: Left;   AV FISTULA PLACEMENT Left 01/13/2013   Procedure: INSERTION OF ARTERIOVENOUS (AV) GORE-TEX GRAFT ARM;  Surgeon: Angelia Mould, MD;  Location: [place];  Service: Vascular;  Laterality: Left;   AV FISTULA PLACEMENT Left 05/05/2013   Procedure: INSERTION OF LEFT UPPER ARM  ARTERIOVENOUS GORTEX GRAFT;  Surgeon: Angelia Mould, MD;  Location: [place];  Service: Vascular;  Laterality: Left;  AV FISTULA PLACEMENT Left 11/26/2019   Procedure: INSERTION OF ARTERIOVENOUS (AV) GORE-TEX GRAFT, left  ARM;  Surgeon: Serafina Mitchell, MD;  Location: [place];  Service: Vascular;  Laterality: Left;   [place] Left 03/26/2013   Procedure: REMOVAL OF  NON INCORPORATED ARTERIOVENOUS GORETEX GRAFT ([place]) left arm * repair of  left brachial artery with vein patch angioplasty.;  Surgeon: Angelia Mould, MD;  Location: [place];  Service: Vascular;  Laterality: Left;   CARDIAC CATHETERIZATION  August 26, 2012   CATARACT EXTRACTION W/ INTRAOCULAR LENS  IMPLANT, BILATERAL     COLONOSCOPY     CORONARY ARTERY BYPASS GRAFT N/A 09/13/2017   Procedure: CORONARY ARTERY BYPASS GRAFTING (CABG) times four LIMA  to LAD, SVG sequentially to OM and RAMUS Intermediate and SVG to Acute Marginal;  Surgeon: [place] Nakayama, MD;  Location: [place];  Service: Open Heart Surgery;  Laterality: N/A;   DIALYSIS/PERMA CATHETER INSERTION N/A 02/28/2021   Procedure: DIALYSIS/PERMA CATHETER  EXCHANGE;  Surgeon: Katha Cabal, MD;  Location: [place] CV LAB;  Service: Cardiovascular;  Laterality: N/A;   EMBOLECTOMY Right 08/27/2013   Procedure: EMBOLECTOMY;  Surgeon: Mal Misty, MD;  Location: [place];  Service: Vascular;  Laterality: Right;  Thrombectomy of Radial and ulnar artery.   ENDOVASCULAR STENT INSERTION Left 09/24/2019   Procedure: Endovascular Stent Graft Insertion, Arm Graft;  Surgeon: Serafina Mitchell, MD;  Location: [facility] OR;  Service: Vascular;  Laterality: Left;   EYE SURGERY     laser.  and surgery for DM   fistula     RUE and wrist   FISTULOGRAM Left 09/24/2019   Procedure: SHUNTOGRAM OF ARTERIOVENOUS FISTULA LEFT ARM,;  Surgeon: Serafina Mitchell, MD;  Location: [place];  Service: Vascular;  Laterality: Left;   INSERTION OF DIALYSIS CATHETER Right 01/01/2013   Procedure: INSERTION OF DIALYSIS CATHETER right  internal jugular;  Surgeon: Serafina Mitchell, MD;  Location: [place];  Service: Vascular;  Laterality: Right;   INSERTION OF DIALYSIS CATHETER N/A 03/26/2013   Procedure: INSERTION OF DIALYSIS CATHETER Left internal jugular vein;  Surgeon: Angelia Mould, MD;  Location: [place];  Service: Vascular;  Laterality: N/A;   INSERTION OF DIALYSIS CATHETER Right 11/26/2019   Procedure: INSERTION OF DIALYSIS CATHETER, right internal jugular;  Surgeon: Serafina Mitchell, MD;  Location: [place];  Service: Vascular;  Laterality: Right;   IR THORACENTESIS ASP PLEURAL SPACE W/IMG GUIDE  09/30/2019   LEFT HEART CATH AND CORONARY ANGIOGRAPHY N/A 09/09/2017   Procedure: LEFT HEART CATH AND CORONARY ANGIOGRAPHY;  Surgeon: Troy Sine, MD;  Location: [place] CV LAB;  Service: Cardiovascular;  Laterality: N/A;   LIGATION OF ARTERIOVENOUS  FISTULA Right 12/30/2013   Procedure: REMOVAL OF SEGMENT OF GORTEX GRAFT AND FISTULA  AND REPAIR OF BRACHIAL ARTERY;  Surgeon: Mal Misty, MD;  Location: [place];  Service: Vascular;  Laterality: Right;   MITRAL VALVE REPAIR N/A  09/13/2017   Procedure: MITRAL VALVE REPAIR ([facility]);  Surgeon: Melrose Nakayama, MD;  Location: [place];  Service: Open Heart Surgery;  Laterality: N/A;   MITRAL VALVE REPAIR (MV)/CORONARY ARTERY BYPASS GRAFTING (CABG)  09/13/2017   PENILE PROSTHESIS IMPLANT     1997.. no card   PERIPHERAL VASCULAR BALLOON ANGIOPLASTY Left 10/04/2017   Procedure: PERIPHERAL VASCULAR BALLOON ANGIOPLASTY;  Surgeon: Elam Dutch, MD;  Location: [place] CV LAB;  Service: Cardiovascular;  Laterality: Left;  AVF   PERIPHERAL VASCULAR BALLOON ANGIOPLASTY Left  07/12/2020   Procedure: PERIPHERAL VASCULAR BALLOON ANGIOPLASTY;  Surgeon: Serafina Mitchell, MD;  Location: [place] CV LAB;  Service: Cardiovascular;  Laterality: Left;  ARM SHUNTOGRAM   REVISION OF ARTERIOVENOUS GORETEX GRAFT Left 09/24/2019   Procedure: REVISION OF ARTERIOVENOUS GORETEX GRAFT LEFT ARM;  Surgeon: Serafina Mitchell, MD;  Location[facility];  Service: Vascular;  Laterality: Left;   TEE WITHOUT CARDIOVERSION N/A 06/11/2013   Procedure: TRANSESOPHAGEAL ECHOCARDIOGRAM (TEE);  Surgeon: Larey Dresser, MD;  Location: [facility] ENDOSCOPY;  Service: Cardiovascular;  Laterality: N/A;   TEE WITHOUT CARDIOVERSION N/A 09/13/2017   Procedure: TRANSESOPHAGEAL ECHOCARDIOGRAM (TEE);  Surgeon: Melrose Nakayama, MD;  Location: [place];  Service: Open Heart Surgery;  Laterality: N/A;   THROMBECTOMY W/ EMBOLECTOMY Left 03/26/2013   Procedure: Attempted thrombectomy of left arm arteriovenous goretex graft.;  Surgeon: Angelia Mould, MD;  Location: [facility] OR;  Service: Vascular;  Laterality: Left;   ULTRASOUND GUIDANCE FOR VASCULAR ACCESS Bilateral 09/24/2019   Procedure: Ultrasound Guidance For Vascular Access, Right Femoral Vein and Left Arm Graft;  Surgeon: Serafina Mitchell, MD;  Location: [facility] OR;  Service: Vascular;  Laterality: Bilateral;   VENOGRAM N/A 04/07/2013   Procedure: VENOGRAM;  Surgeon: Serafina Mitchell, MD;  Location: [facility] CATH LAB;  Service:  Cardiovascular;  Laterality: N/A;     Family History: Family History  Problem Relation Age of Onset   Heart disease Father    Diabetes Sister    Diabetes Brother    Diabetes Son      Social History: Social History   Socioeconomic History   Marital status: Married    Spouse name: Not on file   Number of children: Not on file   Years of education: Not on file   Highest education level: Not on file  Occupational History   Occupation: Cabin crew: DISABLED    Comment: disabled  Tobacco Use   Smoking status: Former    Years: 7.00    Types: Cigarettes    Quit date: 06/10/1973    Years since quitting: 48.0   Smokeless tobacco: Never   Tobacco comments:    Quit in 1974.  Vaping Use   Vaping Use: Never used  Substance and Sexual Activity   Alcohol use: No    Alcohol/week: 0.0 standard drinks   Drug use: No   Sexual activity: Not on file  Other Topics Concern   Not on file  Social History Narrative   Adopted mentally retarded child at home, 2 adopted children, 3 living and 2 deceased.   Social Determinants of Health   Financial Resource Strain: Not on file  Food Insecurity: Not on file  Transportation Needs: Not on file  Physical Activity: Not on file  Stress: Not on file  Social Connections: Not on file  Intimate Partner Violence: Not on file     Review of Systems: Review of Systems  Constitutional:  Positive for fever and malaise/fatigue.  Respiratory:  Positive for shortness of breath.   All other systems reviewed and are negative.  Vital Signs: Blood pressure (!) 88/50, pulse 67, temperature 98.2 F (36.8 C), temperature source Oral, resp. rate 20, height '5\' 9"'$  (1.753 m), weight 82 kg, SpO2 97 %.  Weight trends: Filed Weights   06/26/21 2311  Weight: 82 kg    Physical Exam: General: NAD, laying in bed  Head: Normocephalic, atraumatic. Moist oral mucosal membranes  Eyes: Anicteric  Lungs:  Clear to auscultation, normal effort, [place]  O2  Heart: Regular rate and rhythm  Abdomen:  Soft, nontender  Extremities:  no peripheral edema.  Neurologic: Nonfocal, moving all four extremities  Skin: No lesions  Access: Rt Femoral Permcath     Lab results: Basic Metabolic Panel: Recent Labs  Lab 06/27/21 0042 06/27/21 0611  NA 135 136  K 5.3* 5.1  CL 93* 94*  CO2 28 30  GLUCOSE 330* 296*  BUN 62* 73*  CREATININE 7.02* 7.31*  CALCIUM 8.1* 8.1*    Liver Function Tests: Recent Labs  Lab 06/27/21 0042  AST 16  ALT 14  ALKPHOS 110  BILITOT 0.6  PROT 6.6  ALBUMIN 2.4*   No results for input(s): LIPASE, AMYLASE in the last 168 hours. No results for input(s): AMMONIA in the last 168 hours.  CBC: Recent Labs  Lab 06/27/21 0042 06/27/21 0611  WBC 19.6* 19.6*  NEUTROABS 18.6*  --   HGB 9.1* 9.1*  HCT 28.3* 29.1*  MCV 89.3 87.9  PLT 272 267    Cardiac Enzymes: No results for input(s): CKTOTAL, CKMB, CKMBINDEX, TROPONINI in the last 168 hours.  BNP: Invalid input(s): POCBNP  CBG: No results for input(s): GLUCAP in the last 168 hours.  Microbiology: Results for orders placed or performed during the hospital encounter of 06/26/21  Blood culture (routine x 2)     Status: None (Preliminary result)   Collection Time: 06/27/21 12:42 AM   Specimen: BLOOD  Result Value Ref Range Status   Specimen Description BLOOD RIGHT ASSIST CONTROL  Final   Special Requests   Final    BOTTLES DRAWN AEROBIC AND ANAEROBIC Blood Culture adequate volume   Culture   Final    NO GROWTH < 12 HOURS Performed at [facility], [address], [place], [place] [address]    Report Status PENDING  Incomplete  Resp Panel by RT-PCR (Flu A&B, Covid) Nasopharyngeal Swab     Status: None   Collection Time: 06/27/21 12:42 AM   Specimen: Nasopharyngeal Swab; Nasopharyngeal(NP) swabs in vial transport medium  Result Value Ref Range Status   SARS Coronavirus 2 by RT PCR NEGATIVE NEGATIVE Final    Comment: (NOTE) SARS-CoV-2  target nucleic acids are NOT DETECTED.  The SARS-CoV-2 RNA is generally detectable in upper respiratory specimens during the acute phase of infection. The lowest concentration of SARS-CoV-2 viral copies this assay can detect is 138 copies/mL. A negative result does not preclude SARS-Cov-2 infection and should not be used as the sole basis for treatment or other patient management decisions. A negative result may occur with  improper specimen collection/handling, submission of specimen other than nasopharyngeal swab, presence of viral mutation(s) within the areas targeted by this assay, and inadequate number of viral copies(<138 copies/mL). A negative result must be combined with clinical observations, patient history, and epidemiological information. The expected result is Negative.  Fact Sheet for Patients:  EntrepreneurPulse.com.au  Fact Sheet for Healthcare Providers:  IncredibleEmployment.be  This test is no t yet approved or cleared by the [place] FDA and  has been authorized for detection and/or diagnosis of SARS-CoV-2 by FDA under an Emergency Use Authorization (EUA). This EUA will remain  in effect (meaning this test can be used) for the duration of the COVID-19 declaration under Section 564(b)(1) of the Act, 21 U.S.C.section 360bbb-3(b)(1), unless the authorization is terminated  or revoked sooner.       Influenza A by PCR NEGATIVE NEGATIVE Final   Influenza B by PCR NEGATIVE NEGATIVE Final  Comment: (NOTE) The Xpert Xpress SARS-CoV-2/FLU/RSV plus assay is intended as an aid in the diagnosis of influenza from Nasopharyngeal swab specimens and should not be used as a sole basis for treatment. Nasal washings and aspirates are unacceptable for Xpert Xpress SARS-CoV-2/FLU/RSV testing.  Fact Sheet for Patients: EntrepreneurPulse.com.au  Fact Sheet for Healthcare  Providers: IncredibleEmployment.be  This test is not yet approved or cleared by the [place] FDA and has been authorized for detection and/or diagnosis of SARS-CoV-2 by FDA under an Emergency Use Authorization (EUA). This EUA will remain in effect (meaning this test can be used) for the duration of the COVID-19 declaration under Section 564(b)(1) of the Act, 21 U.S.C. section 360bbb-3(b)(1), unless the authorization is terminated or revoked.  Performed at [facility], [address] [place], [place] [address]   Blood culture (routine x 2)     Status: None (Preliminary result)   Collection Time: 06/27/21 12:43 AM   Specimen: BLOOD  Result Value Ref Range Status   Specimen Description BLOOD RIGHT FOREARM  Final   Special Requests   Final    BOTTLES DRAWN AEROBIC AND ANAEROBIC Blood Culture adequate volume   Culture   Final    NO GROWTH < 12 HOURS Performed at [facility], [address], [place], [place] [address]    Report Status PENDING  Incomplete    Coagulation Studies: Recent Labs    06/27/21 0226 06/27/21 0611  LABPROT 16.7* 16.6*  INR 1.4* 1.4*    Urinalysis: No results for input(s): COLORURINE, LABSPEC, PHURINE, GLUCOSEU, HGBUR, BILIRUBINUR, KETONESUR, PROTEINUR, UROBILINOGEN, NITRITE, LEUKOCYTESUR in the last 72 hours.  Invalid input(s): APPERANCEUR    Imaging: DG Chest Portable 1 View  Result Date: 06/27/2021 CLINICAL DATA:  Fever and pressure wounds with elevated white blood cell count. EXAM: PORTABLE CHEST 1 VIEW COMPARISON:  Feb 28, 2021 FINDINGS: Multiple sternal wires are seen. Chronic airspace disease is seen throughout the mid and lower left lung which is unchanged in appearance. A stable, chronic loculated left pleural effusion is noted. No pneumothorax is identified. Stable vascular crowding is seen along the infrahilar region. A stable coarsely calcified left thyroid nodule is seen. A femoral catheter is  seen with its distal tip at the level of the diaphragm, to the right of midline. The heart size and mediastinal contours are within normal limits. Degenerative changes are seen within the thoracic spine. IMPRESSION: 1. Chronic airspace disease throughout the mid and lower left lung with a stable chronic loculated left pleural effusion. 2. No new or acute findings. Electronically Signed   By: Virgina Norfolk M.D.   On: 06/27/2021 00:28   DG Foot Complete Right  Result Date: 06/27/2021 CLINICAL DATA:  Fever and pressure wounds with elevated white blood cell count. EXAM: RIGHT FOOT COMPLETE - 3+ VIEW COMPARISON:  None. FINDINGS: There is no evidence of acute fracture or dislocation. Soft tissue ulcerations are seen along the right heel with adjacent area of cortical destruction noted along the right calcaneus. Marked severity vascular calcification is noted. IMPRESSION: Soft tissue ulcerations along the right heel with adjacent area of cortical destruction noted along the right calcaneus, suspicious for acute osteomyelitis. MRI correlation is recommended. Electronically Signed   By: Virgina Norfolk M.D.   On: 06/27/2021 00:30     Assessment & Plan: Mr. AMARII NINI is a 75 y.o.  male with systolic congestive heart failure, COPD, CAD, GERD, Hepatitis C, hypertension, dyslipidemia and end stage renal disease on dialysis, who was  admitted to [facility] on 06/26/2021 for Acute respiratory failure with hypoxia (HCC) [J96.01] Sepsis ([place]) [A41.9] Fever, unspecified fever cause [R50.9] Hypervolemia, unspecified hypervolemia type [E87.70] Osteomyelitis of right foot, unspecified type ([place]) [M86.9] Sepsis, due to unspecified organism, unspecified whether acute organ dysfunction present ([place]) [A41.9]  [facility] [address]/MWF/Rt Femoral Permcath  End stage renal disease on dialysis: Continue outpatient schedule during admission. Next treatment scheduled for Wednesday  Anemia of chronic kidney disease  Lab  Results  Component Value Date   HGB 9.1 (L) 06/27/2021  Hgb within acceptable range. Will monitor for need for ESA  3. Secondary Hyperparathyroidism:  Lab Results  Component Value Date   PTH 63 10/05/2017   PTH Comment 10/05/2017   CALCIUM 8.1 (L) 06/27/2021   CAION 1.05 (L) 07/12/2020   PHOS 5.3 (H) 11/18/2020  Cinacalcet as outpatient  Calcium not at goal Continue Sevelamer with meals. Will obtain phosphorus with am labs  4. Diabetes mellitus type II with chronic kidney disease  insulin dependent. Home regimen includes levemir. Most recent hemoglobin A1c is 6.2 on 12/11/17.       LOS: 0   9/13/202212:15 PM

## 2021-06-27 NOTE — ED Notes (Addendum)
MD Alfred Levins notified of Critical lactic acid: 3.1

## 2021-06-27 NOTE — Sepsis Progress Note (Signed)
[place] following for Sepsis Protocol 

## 2021-06-27 NOTE — Consult Note (Signed)
ORTHOPAEDIC CONSULTATION  REQUESTING PHYSICIAN: Sidney Ace, MD  Chief Complaint: Right heel osteomyelitis  HPI: Ernest [name] is a 75 y.o. male who complains of worsening pain to his right heel.  Brought from a skilled nursing facility.  Complaining of worsening pain into the right heel.  Upon admission had a noted large necrotic ulcer on the right heel area.  Past Medical History:  Diagnosis Date   Abnormal stress test    s/p cath November 2013 with modest disease involving the ostial left main, proximal LAD, proximal RCA - do not appear to be hemodynamically signficant; mild LV dysfunction   Anemia    low iron   Anxiety    Arthritis    Bacteremia    Chronic systolic CHF (congestive heart failure) (HCC)    COPD (chronic obstructive pulmonary disease) ([place])    Coronary artery disease    a. per cath report 2013, b. 09/13/2017-CABG X4 & Mitral valve repair   Depression    DVT (deep venous thrombosis) (HCC)    arm - right- finished blood thinner- not sure when it   Ejection fraction < 50%    Erectile dysfunction    penile implant   ESRD (end stage renal disease) ([place])    [facility] on a MWF schedule.      Gout    Hx: of   Hepatitis C    History of seizures    last one 2018   Hyperlipidemia    Hypertension    Mitral valve disease    s/p Mitral repair 09/13/17   Obesity    Retinopathy    Shortness of breath    Hx: of with exertion   Stroke [facility])    "several" left side weakness, speech affected   Type II or unspecified type diabetes mellitus without mention of complication, not stated as uncontrolled    adult onset   Past Surgical History:  Procedure Laterality Date   A/V FISTULAGRAM N/A 10/04/2017   Procedure: A/V FISTULAGRAM;  Surgeon: Elam Dutch, MD;  Location: [place] [facility];  Service: Cardiovascular;  Laterality: N/A;   A/V SHUNTOGRAM Left 07/12/2020   Procedure: A/V SHUNTOGRAM;  Surgeon: Serafina Mitchell, MD;  Location: [place] [facility];   Service: Cardiovascular;  Laterality: Left;   ANGIOPLASTY Left 09/24/2019   Procedure: Angioplasty Innominate Vien;  Surgeon: Serafina Mitchell, MD;  Location: [place];  Service: Vascular;  Laterality: Left;   AV FISTULA PLACEMENT Left 01/13/2013   Procedure: INSERTION OF ARTERIOVENOUS (AV) GORE-TEX GRAFT ARM;  Surgeon: Angelia Mould, MD;  Location: [place];  Service: Vascular;  Laterality: Left;   AV FISTULA PLACEMENT Left 05/05/2013   Procedure: INSERTION OF LEFT UPPER ARM  ARTERIOVENOUS GORTEX GRAFT;  Surgeon: Angelia Mould, MD;  Location: [place];  Service: Vascular;  Laterality: Left;   AV FISTULA PLACEMENT Left 11/26/2019   Procedure: INSERTION OF ARTERIOVENOUS (AV) GORE-TEX GRAFT, left  ARM;  Surgeon: Serafina Mitchell, MD;  Location: [place];  Service: Vascular;  Laterality: Left;   [place] REMOVAL Left 03/26/2013   Procedure: REMOVAL OF  NON INCORPORATED ARTERIOVENOUS GORETEX GRAFT ([place]) left arm * repair of  left brachial artery with vein patch angioplasty.;  Surgeon: Angelia Mould, MD;  Location: [place];  Service: Vascular;  Laterality: Left;   CARDIAC CATHETERIZATION  August 26, 2012   CATARACT EXTRACTION W/ INTRAOCULAR LENS  IMPLANT, BILATERAL     COLONOSCOPY     CORONARY ARTERY BYPASS GRAFT  N/A 09/13/2017   Procedure: CORONARY ARTERY BYPASS GRAFTING (CABG) times four LIMA  to LAD, SVG sequentially to OM and RAMUS Intermediate and SVG to Acute Marginal;  Surgeon: Melrose Nakayama, MD;  Location: [place];  Service: Open Heart Surgery;  Laterality: N/A;   DIALYSIS/PERMA CATHETER INSERTION N/A 02/28/2021   Procedure: DIALYSIS/PERMA CATHETER EXCHANGE;  Surgeon: Katha Cabal, MD;  Location: [place] [facility];  Service: Cardiovascular;  Laterality: N/A;   EMBOLECTOMY Right 08/27/2013   Procedure: EMBOLECTOMY;  Surgeon: Mal Misty, MD;  Location: [place];  Service: Vascular;  Laterality: Right;  Thrombectomy of Radial and ulnar artery.   ENDOVASCULAR STENT INSERTION  Left 09/24/2019   Procedure: Endovascular Stent Graft Insertion, Arm Graft;  Surgeon: Serafina Mitchell, MD;  Location: [facility] OR;  Service: Vascular;  Laterality: Left;   EYE SURGERY     laser.  and surgery for DM   fistula     RUE and wrist   FISTULOGRAM Left 09/24/2019   Procedure: SHUNTOGRAM OF ARTERIOVENOUS FISTULA LEFT ARM,;  Surgeon: Serafina Mitchell, MD;  Location: [place];  Service: Vascular;  Laterality: Left;   INSERTION OF DIALYSIS CATHETER Right 01/01/2013   Procedure: INSERTION OF DIALYSIS CATHETER right  internal jugular;  Surgeon: Serafina Mitchell, MD;  Location: [place];  Service: Vascular;  Laterality: Right;   INSERTION OF DIALYSIS CATHETER N/A 03/26/2013   Procedure: INSERTION OF DIALYSIS CATHETER Left internal jugular vein;  Surgeon: Angelia Mould, MD;  Location: [place];  Service: Vascular;  Laterality: N/A;   INSERTION OF DIALYSIS CATHETER Right 11/26/2019   Procedure: INSERTION OF DIALYSIS CATHETER, right internal jugular;  Surgeon: Serafina Mitchell, MD;  Location: [place];  Service: Vascular;  Laterality: Right;   IR THORACENTESIS ASP PLEURAL SPACE W/IMG GUIDE  09/30/2019   LEFT HEART CATH AND CORONARY ANGIOGRAPHY N/A 09/09/2017   Procedure: LEFT HEART CATH AND CORONARY ANGIOGRAPHY;  Surgeon: Troy Sine, MD;  Location: [place] CV LAB;  Service: Cardiovascular;  Laterality: N/A;   LIGATION OF ARTERIOVENOUS  FISTULA Right 12/30/2013   Procedure: REMOVAL OF SEGMENT OF GORTEX GRAFT AND FISTULA  AND REPAIR OF BRACHIAL ARTERY;  Surgeon: Mal Misty, MD;  Location: [place];  Service: Vascular;  Laterality: Right;   MITRAL VALVE REPAIR N/A 09/13/2017   Procedure: MITRAL VALVE REPAIR (MVR);  Surgeon: Melrose Nakayama, MD;  Location: [place];  Service: Open Heart Surgery;  Laterality: N/A;   MITRAL VALVE REPAIR (MV)/CORONARY ARTERY BYPASS GRAFTING (CABG)  09/13/2017   PENILE PROSTHESIS IMPLANT     1997.. no card   PERIPHERAL VASCULAR BALLOON ANGIOPLASTY Left 10/04/2017    Procedure: PERIPHERAL VASCULAR BALLOON ANGIOPLASTY;  Surgeon: Elam Dutch, MD;  Location: [place] [facility];  Service: Cardiovascular;  Laterality: Left;  AVF   PERIPHERAL VASCULAR BALLOON ANGIOPLASTY Left 07/12/2020   Procedure: PERIPHERAL VASCULAR BALLOON ANGIOPLASTY;  Surgeon: Serafina Mitchell, MD;  Location: [place] CV LAB;  Service: Cardiovascular;  Laterality: Left;  ARM SHUNTOGRAM   REVISION OF ARTERIOVENOUS GORETEX GRAFT Left 09/24/2019   Procedure: REVISION OF ARTERIOVENOUS GORETEX GRAFT LEFT ARM;  Surgeon: Serafina Mitchell, MD;  [facility];  Service: Vascular;  Laterality: Left;   TEE WITHOUT CARDIOVERSION N/A 06/11/2013   Procedure: TRANSESOPHAGEAL ECHOCARDIOGRAM (TEE);  Surgeon: Larey Dresser, MD;  Location: [facility] ENDOSCOPY;  Service: Cardiovascular;  Laterality: N/A;   TEE WITHOUT CARDIOVERSION N/A 09/13/2017   Procedure: TRANSESOPHAGEAL ECHOCARDIOGRAM (TEE);  Surgeon: Melrose Nakayama,  MD;  [facility] OR;  Service: Open Heart Surgery;  Laterality: N/A;   THROMBECTOMY W/ EMBOLECTOMY Left 03/26/2013   Procedure: Attempted thrombectomy of left arm arteriovenous goretex graft.;  Surgeon: Angelia Mould, MD;  Location: [facility] OR;  Service: Vascular;  Laterality: Left;   ULTRASOUND GUIDANCE FOR VASCULAR ACCESS Bilateral 09/24/2019   Procedure: Ultrasound Guidance For Vascular Access, Right Femoral Vein and Left Arm Graft;  Surgeon: Serafina Mitchell, MD;  Location: 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  Vaping Use   Vaping Use: Never used  Substance and Sexual Activity   Alcohol use: No    Alcohol/week: 0.0 standard drinks   Drug use: No   Sexual activity: Not on file  Other Topics Concern   Not on file  Social History Narrative   Adopted mentally retarded child at home, 2 adopted children, 3 living and 2 deceased.   Social Determinants of Health   Financial Resource Strain: Not on file  Food Insecurity: Not on file  Transportation Needs: Not on file  Physical Activity: Not on file  Stress: Not on file  Social Connections: Not on file   Family History  Problem Relation Age of Onset   Heart disease Father    Diabetes Sister    Diabetes Brother    Diabetes Son    [name] Prior to Admission medications   Medication Sig Start Date End Date Taking? Authorizing Provider  allopurinol (ZYLOPRIM) 100 MG tablet Take 100 mg by mouth daily.   Yes [provider]  Amino Acids-Protein Hydrolys (FEEDING SUPPLEMENT, PRO-STAT 64,) LIQD Take 30 mLs by mouth 3 (three) times daily with meals.   Yes [provider]  amiodarone (PACERONE) 200 MG tablet Take 200 mg by mouth daily.   Yes [provider]  aspirin EC 81 MG tablet Take 1 tablet (81 mg total) by mouth daily. 11/29/15  Yes Rivet, Sindy Guadeloupe, MD  atorvastatin (LIPITOR) 40 MG tablet Take 40 mg by mouth at bedtime.   Yes [provider]  ciprofloxacin (CIPRO) 500 MG tablet Take 500 mg by mouth daily. 1 dose administered at [facility]   Yes [provider]  docusate sodium (COLACE) 100 MG capsule Take 100 mg by mouth daily.   Yes [provider]  doxycycline (DORYX) 100 MG EC tablet Take 100 mg by mouth 2 (two) times daily. No doses administered at SNF. Prescribed for 1 week duration   Yes [provider]  insulin detemir (LEVEMIR) 100 UNIT/ML injection Inject 0.2 mLs (20 Units total) into the skin 2 (two) times daily. Patient taking differently: Inject 23 Units into the skin at bedtime. 10/07/17  Yes Mahad Giovanni, [name]  levETIRAcetam (KEPPRA) 500 MG tablet Take 500 mg by mouth See admin instructions. Take 1 tablet ('500mg'$ ) by mouth twice daily every Tuesday, Thursday, Saturday and Sunday   Yes [provider]  levETIRAcetam (KEPPRA) 500 MG tablet Take 500 mg by mouth See admin instructions. Take 1 tablet ('500mg'$ ) by mouth three times daily on Monday, Wednesday and Friday   Yes [provider]  mirtazapine (REMERON) 7.5 MG tablet Take 7.5 mg by mouth at bedtime.    Yes [provider]  polyethylene glycol (MIRALAX / GLYCOLAX) 17 g packet Take 17 g by mouth daily.   Yes [provider]  senna-docusate (SENOKOT-S) 8.6-50 MG tablet Take 1 tablet by mouth at bedtime.   Yes [provider]  sertraline (ZOLOFT) 25 MG tablet Take 25 mg by mouth at bedtime.   Yes [provider]  sevelamer carbonate (RENVELA) 800 MG tablet Take 800 mg by mouth 3 (three) times daily with meals.   Yes [provider]  acetaminophen (TYLENOL) 500 MG tablet Take 1,000 mg by mouth every 6 (six) hours as needed for mild pain or moderate pain. Patient not taking: Reported on 06/27/2021    [provider]  cinacalcet (SENSIPAR) 30 MG tablet Take 30 mg by mouth every Monday, Wednesday, and Friday. Patient not taking: Reported on 06/27/2021    [provider]  cyproheptadine (PERIACTIN) 4 MG tablet Take 2 mg by mouth 3 (three) times daily with meals.  Patient not taking: Reported on 06/27/2021    [provider]  linaclotide (LINZESS) 145 MCG CAPS capsule Take 145 mcg by mouth daily as needed (constipation).  Patient not taking: Reported on 06/27/2021    [provider]  Nutritional Supplements (FEEDING SUPPLEMENT, NEPRO CARB  STEADY,) LIQD Take 237 mLs by mouth 2 (two) times daily between meals. Patient not taking: Reported on 06/27/2021 10/07/17   Jadene Pierini E, [name]  Polyethyl Glycol-Propyl Glycol 0.4-0.3 % SOLN Place 1 drop into both eyes 3 (three) times daily as needed (dry/irritated eyes.). Patient not taking: Reported on 06/27/2021    [provider]   DG Chest Portable 1 View  Result Date: 06/27/2021 CLINICAL DATA:  Fever and pressure wounds with elevated white blood cell count. EXAM: PORTABLE CHEST 1 VIEW COMPARISON:  Feb 28, 2021 FINDINGS: Multiple sternal wires are seen. Chronic airspace disease is seen throughout the mid and lower left lung which is unchanged in appearance. A stable, chronic loculated left pleural effusion is noted. No pneumothorax is identified. Stable vascular crowding is seen along the infrahilar region. A stable coarsely calcified left thyroid nodule is seen. A femoral catheter is seen with its distal tip at the level of the diaphragm, to the right of midline. The heart size and mediastinal contours are within normal limits. Degenerative changes are seen within the thoracic spine. IMPRESSION: 1. Chronic airspace disease throughout the mid and lower left lung with a stable chronic loculated left pleural effusion. 2. No new or acute findings. Electronically Signed   By: Virgina Norfolk M.D.   On: 06/27/2021 00:28   DG Foot Complete Right  Result Date: 06/27/2021 CLINICAL DATA:  Fever and pressure wounds with elevated white blood cell count. EXAM: RIGHT FOOT COMPLETE - 3+ VIEW COMPARISON:  None. FINDINGS: There is no evidence of acute fracture or dislocation. Soft tissue ulcerations are seen along the right heel with adjacent area of cortical destruction noted along the right calcaneus. Marked severity vascular calcification is noted. IMPRESSION: Soft tissue ulcerations along the  right heel with adjacent area of cortical destruction noted along the right calcaneus, suspicious for acute  osteomyelitis. MRI correlation is recommended. Electronically Signed   By: Virgina Norfolk M.D.   On: 06/27/2021 00:30    Positive ROS: All other systems have been reviewed and were otherwise negative with the exception of those mentioned in the HPI and as above.  12 point ROS was performed.  Physical Exam: General: Alert and oriented.  No apparent distress.  Vascular:  Left foot:Dorsalis Pedis:  absent Posterior Tibial:  absent  Right foot: Dorsalis Pedis:  absent Posterior Tibial:  absent  Neuro:absent protective sensation  Derm: Left foot without ulceration.    Large necrotic heel ulcer to the right side.  Some mild erythema surrounding the area.  Scant drainage is noted.  No severe cellulitis at this time.  Ortho/MS: Noted for lower extremity edema.  Assessment: Necrotic right heel ulceration Peripheral vascular disease End-stage renal disease Type 2 diabetes  Plan: Patient has a very large posterior heel necrotic ulceration.  Given the size of this ulcerative site and concern for osteomyelitis we will order an MRI.  He has obvious calcified arteries diffusely out the foot and ankle.  He likely has severe end-stage peripheral vascular disease as well.  I would recommend vascular consultation as well.  I do not think there is go to be very much from a salvage standpoint.  Certainly if there is obvious osteomyelitis on the MRI would recommend below the knee amputation.  I briefly discussed this with the patient's daughter via phone.  We will follow-up after MRI has been performed.    Elesa Hacker, DPM Cell [phone]   06/27/2021 1:32 PM

## 2021-06-27 NOTE — Progress Notes (Signed)
*  PRELIMINARY RESULTS* Echocardiogram 2D Echocardiogram has been performed.  Ernest [name] 06/27/2021, 1:30 PM

## 2021-06-28 ENCOUNTER — Other Ambulatory Visit (INDEPENDENT_AMBULATORY_CARE_PROVIDER_SITE_OTHER): Payer: Self-pay | Admitting: Vascular Surgery

## 2021-06-28 ENCOUNTER — Inpatient Hospital Stay
Admission: EM | Disposition: A | Discharge status: Skilled Nursing Facility | Payer: Self-pay | Source: Home / Self Care | Attending: Student

## 2021-06-28 DIAGNOSIS — M869 Osteomyelitis, unspecified: Secondary | ICD-10-CM | POA: Diagnosis not present

## 2021-06-28 DIAGNOSIS — N186 End stage renal disease: Secondary | ICD-10-CM

## 2021-06-28 DIAGNOSIS — R509 Fever, unspecified: Secondary | ICD-10-CM

## 2021-06-28 DIAGNOSIS — I739 Peripheral vascular disease, unspecified: Secondary | ICD-10-CM | POA: Diagnosis not present

## 2021-06-28 DIAGNOSIS — I70263 Atherosclerosis of native arteries of extremities with gangrene, bilateral legs: Secondary | ICD-10-CM | POA: Diagnosis not present

## 2021-06-28 DIAGNOSIS — A419 Sepsis, unspecified organism: Principal | ICD-10-CM

## 2021-06-28 DIAGNOSIS — L97414 Non-pressure chronic ulcer of right heel and midfoot with necrosis of bone: Secondary | ICD-10-CM

## 2021-06-28 DIAGNOSIS — I5042 Chronic combined systolic (congestive) and diastolic (congestive) heart failure: Secondary | ICD-10-CM

## 2021-06-28 DIAGNOSIS — Z8673 Personal history of transient ischemic attack (TIA), and cerebral infarction without residual deficits: Secondary | ICD-10-CM

## 2021-06-28 DIAGNOSIS — E109 Type 1 diabetes mellitus without complications: Secondary | ICD-10-CM

## 2021-06-28 HISTORY — PX: LOWER EXTREMITY ANGIOGRAPHY: CATH118251

## 2021-06-28 LAB — MRSA NEXT GEN BY PCR, NASAL: MRSA by PCR Next Gen: NOT DETECTED

## 2021-06-28 LAB — BASIC METABOLIC PANEL
Anion gap: 15 (ref 5–15)
BUN: 84 mg/dL — ABNORMAL HIGH (ref 8–23)
CO2: 25 mmol/L (ref 22–32)
Calcium: 8.3 mg/dL — ABNORMAL LOW (ref 8.9–10.3)
Chloride: 97 mmol/L — ABNORMAL LOW (ref 98–111)
Creatinine, Ser: 8.67 mg/dL — ABNORMAL HIGH (ref 0.61–1.24)
GFR, Estimated: 6 mL/min — ABNORMAL LOW (ref >60)
Glucose, Bld: 91 mg/dL (ref 70–99)
Potassium: 4.9 mmol/L (ref 3.5–5.1)
Sodium: 137 mmol/L (ref 135–145)

## 2021-06-28 LAB — HEPATITIS B SURFACE ANTIBODY, QUANTITATIVE: Hep B S AB Quant (Post): 716.8 m[IU]/mL (ref >9.9)

## 2021-06-28 LAB — GLUCOSE, CAPILLARY
Glucose-Capillary: 140 mg/dL — ABNORMAL HIGH (ref 70–99)
Glucose-Capillary: 144 mg/dL — ABNORMAL HIGH (ref 70–99)

## 2021-06-28 SURGERY — LOWER EXTREMITY ANGIOGRAPHY
Anesthesia: Moderate Sedation

## 2021-06-28 MED ORDER — HEPARIN SODIUM (PORCINE) 1000 UNIT/ML IJ SOLN
INTRAMUSCULAR | Status: AC
  Filled 2021-06-28: qty 1

## 2021-06-28 MED ORDER — OXYCODONE HCL 5 MG PO TABS
5.0000 mg | ORAL_TABLET | ORAL | Status: DC | PRN
  Administered 2021-07-08: 10 mg via ORAL
  Administered 2021-07-09: 5 mg via ORAL
  Filled 2021-06-28: qty 2
  Filled 2021-06-28: qty 1

## 2021-06-28 MED ORDER — CLOPIDOGREL BISULFATE 75 MG PO TABS
75.0000 mg | ORAL_TABLET | Freq: Every day | ORAL | Status: DC
  Administered 2021-06-29 – 2021-07-06 (×8): 75 mg via ORAL
  Filled 2021-06-28 (×8): qty 1

## 2021-06-28 MED ORDER — FENTANYL CITRATE (PF) 100 MCG/2ML IJ SOLN
INTRAMUSCULAR | Status: DC | PRN
  Administered 2021-06-28: 25 ug via INTRAVENOUS

## 2021-06-28 MED ORDER — CEFAZOLIN SODIUM-DEXTROSE 1-4 GM/50ML-% IV SOLN: 1.0000 g | Freq: Once | INTRAVENOUS | Status: DC

## 2021-06-28 MED ORDER — SODIUM CHLORIDE 0.9 % IV SOLN: 250.0000 mL | INTRAVENOUS | Status: DC | PRN

## 2021-06-28 MED ORDER — CLOPIDOGREL BISULFATE 75 MG PO TABS
300.0000 mg | ORAL_TABLET | ORAL | Status: AC
  Administered 2021-06-28: 300 mg via ORAL
  Filled 2021-06-28: qty 4

## 2021-06-28 MED ORDER — HYDROMORPHONE HCL 1 MG/ML IJ SOLN: 1.0000 mg | Freq: Once | INTRAMUSCULAR | Status: DC | PRN

## 2021-06-28 MED ORDER — FENTANYL CITRATE PF 50 MCG/ML IJ SOSY
PREFILLED_SYRINGE | INTRAMUSCULAR | Status: AC
  Filled 2021-06-28: qty 1

## 2021-06-28 MED ORDER — MIDAZOLAM HCL 2 MG/2ML IJ SOLN
INTRAMUSCULAR | Status: DC | PRN
  Administered 2021-06-28: 1 mg via INTRAVENOUS

## 2021-06-28 MED ORDER — ALBUMIN HUMAN 25 % IV SOLN: 25.0000 g | Freq: Once | INTRAVENOUS | Status: AC

## 2021-06-28 MED ORDER — SODIUM CHLORIDE 0.9% FLUSH
3.0000 mL | Freq: Two times a day (BID) | INTRAVENOUS | Status: DC
Start: 2021-06-28 — End: 2021-07-08
  Administered 2021-06-29 – 2021-07-08 (×18): 3 mL via INTRAVENOUS

## 2021-06-28 MED ORDER — ASPIRIN 325 MG PO TABS
325.0000 mg | ORAL_TABLET | ORAL | Status: AC
  Administered 2021-06-28: 325 mg via ORAL
  Filled 2021-06-28: qty 1

## 2021-06-28 MED ORDER — MORPHINE SULFATE (PF) 4 MG/ML IV SOLN
2.0000 mg | INTRAVENOUS | Status: DC | PRN
Start: 2021-06-28 — End: 2021-06-29

## 2021-06-28 MED ORDER — MIDAZOLAM HCL 2 MG/2ML IJ SOLN
INTRAMUSCULAR | Status: AC
  Filled 2021-06-28: qty 2

## 2021-06-28 MED ORDER — DIPHENHYDRAMINE HCL 50 MG/ML IJ SOLN: 50.0000 mg | Freq: Once | INTRAMUSCULAR | Status: DC | PRN

## 2021-06-28 MED ORDER — ALBUMIN HUMAN 25 % IV SOLN
INTRAVENOUS | Status: AC
  Administered 2021-06-28: 25 g via INTRAVENOUS
  Filled 2021-06-28: qty 100

## 2021-06-28 MED ORDER — ONDANSETRON HCL 4 MG/2ML IJ SOLN: 4.0000 mg | Freq: Four times a day (QID) | INTRAMUSCULAR | Status: DC | PRN

## 2021-06-28 MED ORDER — FAMOTIDINE 20 MG PO TABS: 40.0000 mg | ORAL_TABLET | Freq: Once | ORAL | Status: DC | PRN

## 2021-06-28 MED ORDER — IODIXANOL 320 MG/ML IV SOLN
INTRAVENOUS | Status: DC | PRN
  Administered 2021-06-28: 50 mL via INTRA_ARTERIAL

## 2021-06-28 MED ORDER — ASPIRIN EC 81 MG PO TBEC
81.0000 mg | DELAYED_RELEASE_TABLET | Freq: Every day | ORAL | Status: DC
  Administered 2021-06-29 – 2021-07-20 (×21): 81 mg via ORAL
  Filled 2021-06-28 (×23): qty 1

## 2021-06-28 MED ORDER — SODIUM CHLORIDE 0.9% FLUSH: 3.0000 mL | INTRAVENOUS | Status: DC | PRN

## 2021-06-28 MED ORDER — HEPARIN SODIUM (PORCINE) 1000 UNIT/ML IJ SOLN
INTRAMUSCULAR | Status: DC | PRN
  Administered 2021-06-28: 4000 [IU] via INTRAVENOUS

## 2021-06-28 MED ORDER — METHYLPREDNISOLONE SODIUM SUCC 125 MG IJ SOLR: 125.0000 mg | Freq: Once | INTRAMUSCULAR | Status: DC | PRN

## 2021-06-28 MED ORDER — SODIUM CHLORIDE 0.9 % IV SOLN: INTRAVENOUS | Status: DC

## 2021-06-28 MED ORDER — CEFAZOLIN SODIUM-DEXTROSE 1-4 GM/50ML-% IV SOLN
INTRAVENOUS | Status: AC
  Filled 2021-06-28: qty 50

## 2021-06-28 MED ORDER — MIDAZOLAM HCL 2 MG/ML PO SYRP: 8.0000 mg | ORAL_SOLUTION | Freq: Once | ORAL | Status: DC | PRN

## 2021-06-28 SURGICAL SUPPLY — 17 items
BALLN LUTONIX DCB 6X40X130 (BALLOONS) ×2
BALLN ULTRASCORE 5X40X130 (BALLOONS) ×2
BALLOON LUTONIX DCB 6X40X130 (BALLOONS) ×1 IMPLANT
BALLOON ULTRASCORE 5X40X130 (BALLOONS) ×1 IMPLANT
CANNULA 5F STIFF (CANNULA) ×2 IMPLANT
CATH ANGIO 5F PIGTAIL 65CM (CATHETERS) ×2 IMPLANT
COVER PROBE U/S 5X48 (MISCELLANEOUS) ×2 IMPLANT
DEVICE STARCLOSE SE CLOSURE (Vascular Products) ×2 IMPLANT
GLIDEWIRE ADV .035X260CM (WIRE) ×2 IMPLANT
KIT ENCORE 26 ADVANTAGE (KITS) ×2 IMPLANT
PACK ANGIOGRAPHY (CUSTOM PROCEDURE TRAY) ×2 IMPLANT
SHEATH ANL2 6FRX45 HC (SHEATH) ×2 IMPLANT
SHEATH BRITE TIP 5FRX11 (SHEATH) ×2 IMPLANT
SHEATH BRITE TIP 6FRX11 (SHEATH) ×2 IMPLANT
SYR MEDRAD MARK 7 150ML (SYRINGE) ×2 IMPLANT
TUBING CONTRAST HIGH PRESS 48 (TUBING) ×4 IMPLANT
WIRE GUIDERIGHT .035X150 (WIRE) ×2 IMPLANT

## 2021-06-28 NOTE — Progress Notes (Signed)
Spoke with Alyse Low in central telemetry at [phone] to transfer patient from room 226 to [facility] 2.

## 2021-06-28 NOTE — Progress Notes (Signed)
UF goal decreased from 2500 to 1500, NS given and UF turned off due to low BP RN and doctor aware.

## 2021-06-28 NOTE — TOC Initial Note (Addendum)
Transition of Care [facility]) - Initial/Assessment Note    Patient Details  Name: Ernest [name] MRN: [id number] Date of Birth: Jun 18, 1946  [facility] CM/SW Contact:    Eileen Stanford, LCSW Phone Number: 06/28/2021, 9:49 AM  Clinical Narrative:  CSW called the contacts listed in the chart, per the response the numbers were unavailable. CSW looked at previous notes from previous admissions and CSW has contacted pt's daughter in law Villalba. CSW called Beckie Busing and she states she is pt's son's wife. Monique states Presious is not involved and Heather lives in [place]. Mon[facility]ue confirmed pt is from [facility] and has been there since 01-01-2023. Pt's wife passed away last 01-01-2023. Per Monqiue the plan is for pt to return to [facility]. Beckie Busing states her spouse is at work but she would provide him with CSW contact in the case he wanted to reach out.    Pt gets transport to appointments through the facility. The facility also provide medications. PCP is managed within the facility.               Expected Discharge Plan: Skilled Nursing Facility Barriers to Discharge: Continued Medical Work up   Patient Goals and CMS Choice Patient states their goals for this hospitalization and ongoing recovery are:: for pt to return to SNF- per pt's daughter in law      Expected Discharge Plan and Services Expected Discharge Plan: [place] In-house Referral: Clinical Social Work   Post Acute Care Choice: [place] Living arrangements for the past 2 months: [place]                                      Prior Living Arrangements/Services Living arrangements for the past 2 months: [employer] Lives with:: Facility Resident Patient language and need for interpreter reviewed:: Yes Do you feel safe going back to the place where you live?: Yes      Need for Family Participation in Patient Care: Yes (Comment) Care  giver support system in place?: Yes (comment)   Criminal Activity/Legal Involvement Pertinent to Current Situation/Hospitalization: No - Comment as needed  Activities of Daily Living      Permission Sought/Granted Permission sought to share information with : Family Supports    Share Information with NAME: Beckie Busing  Permission granted to share info w AGENCY: [facility]  Permission granted to share info w Relationship: daughter in law     Emotional Assessment Appearance:: Appears stated age Attitude/Demeanor/Rapport: Unable to Assess Affect (typically observed): Unable to Assess Orientation: : Oriented to Self Alcohol / Substance Use: Not Applicable Psych Involvement: No (comment)  Admission diagnosis:  Acute respiratory failure with hypoxia (HCC) [J96.01] Sepsis ([place]) [A41.9] Fever, unspecified fever cause [R50.9] Hypervolemia, unspecified hypervolemia type [E87.70] Osteomyelitis of right foot, unspecified type ([place]) [M86.9] Sepsis, due to unspecified organism, unspecified whether acute organ dysfunction present [facility]) [A41.9] Patient Active Problem List   Diagnosis Date Noted   Sepsis ([place]) 06/27/2021   Disorder of phosphorus metabolism, unspecified 02/20/2021   Hypercalcemia 12/08/2020   Atherosclerotic heart disease of native coronary artery without angina pectoris 11/18/2020   Diarrhea, unspecified 11/18/2020   Fever, unspecified 11/18/2020   Other fluid overload 11/18/2020   Pleural effusion on left 09/30/2019   Acute respiratory failure with hypoxia (HCC) 09/30/2019   Pleural effusion 09/30/2019   Chest pain 12/02/2018  Vascular dementia ([place]) 10/10/2017   Paroxysmal atrial flutter ([place]) 10/10/2017   Altered mental status, unspecified 10/09/2017   Other malaise 10/09/2017   S/P CABG x 4 10/04/2017   Cerebral embolism with cerebral infarction 09/20/2017   NSVT (nonsustained ventricular tachycardia) ([place])    Dyspnea 09/08/2017   Non-ST elevation MI (NSTEMI)  ([place])    Acute on chronic combined systolic and diastolic heart failure ([place])    End-stage renal disease on hemodialysis ([place])    Left leg weakness    TIA (transient ischemic attack) 10/27/2016   Dependence on renal dialysis ([place]) 08/09/2015   History of CVA with residual deficit 08/07/2015   Seizure, late effect of stroke ([place]) 08/07/2015   Diabetic peripheral neuropathy ([place]) 08/07/2015   Unspecified protein-calorie malnutrition ([place]) 05/27/2015   Coagulation defect, unspecified ([place]) 07/16/2014   Type 2 diabetes mellitus with diabetic peripheral angiopathy without gangrene ([place]) 07/01/2014   Pruritus, unspecified 05/26/2014   Elevated prostate specific antigen (PSA) 08/03/2013   Encounter for immunization 07/10/2013   Obstructive sleep apnea 06/16/2013   Hyperlipidemia associated with type 2 diabetes mellitus ([place])    ESRD (end stage renal disease) ([place])    Erectile dysfunction associated with type 2 diabetes mellitus ([place])    Hypertension due to end stage renal disease caused by type 2 diabetes mellitus, on dialysis ([place])    COPD (chronic obstructive pulmonary disease) ([place])    Chronic combined systolic and diastolic CHF (congestive heart failure) ([place])    Anemia in chronic kidney disease 12/27/2011   Long term (current) use of insulin ([place]) 11/03/2009   Gout, unspecified 02/28/2009   Iron deficiency anemia, unspecified 10/30/2008   Secondary hyperparathyroidism of renal origin ([place]) 10/30/2008   Uncontrolled type 2 diabetes mellitus with end-stage renal disease ([place]) 06/08/1984   PCP:  Patient, [name] (Inactive) Pharmacy:   [facility], [address] [address] Phone: [phone] Fax: [phone]     Social Determinants of Health (SDOH) Interventions    Readmission Risk Interventions No flowsheet data found.

## 2021-06-28 NOTE — Progress Notes (Signed)
Pt tolerated tx well , BP remained elevated during last hour of tx. Pt completed a net UF goal of 790 and is alert with no complaints, RN and doctor aware.

## 2021-06-28 NOTE — Progress Notes (Signed)
[facility]  ROUNDING NOTE   Subjective:   Ernest [name] is a 75 y.o. male with systolic congestive heart failure, COPD, CAD, GERD, Hepatitis C, hypertension, dyslipidemia and end stage renal disease on dialysis, who was admitted to [facility] on 06/26/2021 for Acute respiratory failure with hypoxia ([place]) [J96.01] Sepsis ([place]) [A41.9] Fever, unspecified fever cause [R50.9] Hypervolemia, unspecified hypervolemia type [E87.70] Osteomyelitis of right foot, unspecified type ([place]) [M86.9] Sepsis, due to unspecified organism, unspecified whether acute organ dysfunction present [facility]) [A41.9]  Patient seen and evaluated during dialysis   HEMODIALYSIS FLOWSHEET:  Blood Flow Rate (mL/min): 400 mL/min Arterial Pressure (mmHg): -240 mmHg Venous Pressure (mmHg): 180 mmHg Transmembrane Pressure (mmHg): 60 mmHg Ultrafiltration Rate (mL/min): 380 mL/min Dialysate Flow Rate (mL/min): 500 ml/min Conductivity: Machine : 13.7 Conductivity: Machine : 13.7 Dialysis Fluid Bolus: Normal Saline Bolus Amount (mL): 250 mL  No complaints at this time   Objective:  Vital signs in last 24 hours:  Temp:  [98.6 F (37 C)-100.8 F (38.2 C)] 99 F (37.2 C) (09/14 0915) Pulse Rate:  [69-92] 76 (09/14 1130) Resp:  [16-21] 16 (09/14 1130) BP: (87-114)/(48-63) 95/53 (09/14 1130) SpO2:  [94 %-100 %] 100 % (09/14 1130) Weight:  [88.7 kg] 88.7 kg (09/14 0500)  Weight change: 6.723 kg Filed Weights   06/26/21 2311 06/28/21 0500  Weight: 82 kg 88.7 kg    Intake/Output: I/O last 3 completed shifts: In: 1300.8 [P.O.:360; IV Piggyback:940.8] Out: 0    Intake/Output this shift:  No intake/output data recorded.  Physical Exam: General: NAD, resting in bed  Head: Normocephalic, atraumatic. Moist oral mucosal membranes  Eyes: Anicteric  Lungs:  Clear to auscultation, normal effort, [place] O2  Heart: Regular rate and rhythm  Abdomen:  Soft, nontender  Extremities:  no peripheral edema.  Neurologic:  Nonfocal, moving all four extremities  Skin: No lesions  Access: Rt groin Permcath    Basic Metabolic Panel: Recent Labs  Lab 06/27/21 0042 06/27/21 0611 06/28/21 0311  NA 135 136 137  K 5.3* 5.1 4.9  CL 93* 94* 97*  CO2 '28 30 25  '$ GLUCOSE 330* 296* 91  BUN 62* 73* 84*  CREATININE 7.02* 7.31* 8.67*  CALCIUM 8.1* 8.1* 8.3*    Liver Function Tests: Recent Labs  Lab 06/27/21 0042  AST 16  ALT 14  ALKPHOS 110  BILITOT 0.6  PROT 6.6  ALBUMIN 2.4*   No results for input(s): LIPASE, AMYLASE in the last 168 hours. No results for input(s): AMMONIA in the last 168 hours.  CBC: Recent Labs  Lab 06/27/21 0042 06/27/21 0611  WBC 19.6* 19.6*  NEUTROABS 18.6*  --   HGB 9.1* 9.1*  HCT 28.3* 29.1*  MCV 89.3 87.9  PLT 272 267    Cardiac Enzymes: No results for input(s): CKTOTAL, CKMB, CKMBINDEX, TROPONINI in the last 168 hours.  BNP: Invalid input(s): POCBNP  CBG: Recent Labs  Lab 06/27/21 2107  GLUCAP 145*    Microbiology: Results for orders placed or performed during the hospital encounter of 06/26/21  Blood culture (routine x 2)     Status: None (Preliminary result)   Collection Time: 06/27/21 12:42 AM   Specimen: BLOOD  Result Value Ref Range Status   Specimen Description BLOOD RIGHT ASSIST CONTROL  Final   Special Requests   Final    BOTTLES DRAWN AEROBIC AND ANAEROBIC Blood Culture adequate volume   Culture   Final    NO GROWTH 1 DAY Performed at [facility], [address] [place], [place] [address]    Report Status PENDING  Incomplete  Resp Panel by RT-PCR (Flu A&B, Covid) Nasopharyngeal Swab     Status: None   Collection Time: 06/27/21 12:42 AM   Specimen: Nasopharyngeal Swab; Nasopharyngeal(NP) swabs in vial transport medium  Result Value Ref Range Status   SARS Coronavirus 2 by RT PCR NEGATIVE NEGATIVE Final    Comment: (NOTE) SARS-CoV-2 target nucleic acids are NOT DETECTED.  The SARS-CoV-2 RNA is generally detectable in upper  respiratory specimens during the acute phase of infection. The lowest concentration of SARS-CoV-2 viral copies this assay can detect is 138 copies/mL. A negative result does not preclude SARS-Cov-2 infection and should not be used as the sole basis for treatment or other patient management decisions. A negative result may occur with  improper specimen collection/handling, submission of specimen other than nasopharyngeal swab, presence of viral mutation(s) within the areas targeted by this assay, and inadequate number of viral copies(<138 copies/mL). A negative result must be combined with clinical observations, patient history, and epidemiological information. The expected result is Negative.  Fact Sheet for Patients:  EntrepreneurPulse.com.au  Fact Sheet for Healthcare Providers:  IncredibleEmployment.be  This test is no t yet approved or cleared by the [place] FDA and  has been authorized for detection and/or diagnosis of SARS-CoV-2 by FDA under an Emergency Use Authorization (EUA). This EUA will remain  in effect (meaning this test can be used) for the duration of the COVID-19 declaration under Section 564(b)(1) of the Act, 21 U.S.C.section 360bbb-3(b)(1), unless the authorization is terminated  or revoked sooner.       Influenza A by PCR NEGATIVE NEGATIVE Final   Influenza B by PCR NEGATIVE NEGATIVE Final    Comment: (NOTE) The Xpert Xpress SARS-CoV-2/FLU/RSV plus assay is intended as an aid in the diagnosis of influenza from Nasopharyngeal swab specimens and should not be used as a sole basis for treatment. Nasal washings and aspirates are unacceptable for Xpert Xpress SARS-CoV-2/FLU/RSV testing.  Fact Sheet for Patients: EntrepreneurPulse.com.au  Fact Sheet for Healthcare Providers: IncredibleEmployment.be  This test is not yet approved or cleared by the [place] FDA and has been  authorized for detection and/or diagnosis of SARS-CoV-2 by FDA under an Emergency Use Authorization (EUA). This EUA will remain in effect (meaning this test can be used) for the duration of the COVID-19 declaration under Section 564(b)(1) of the Act, 21 U.S.C. section 360bbb-3(b)(1), unless the authorization is terminated or revoked.  Performed at [facility], [address] [place], [place] [address]   Blood culture (routine x 2)     Status: None (Preliminary result)   Collection Time: 06/27/21 12:43 AM   Specimen: BLOOD  Result Value Ref Range Status   Specimen Description BLOOD RIGHT FOREARM  Final   Special Requests   Final    BOTTLES DRAWN AEROBIC AND ANAEROBIC Blood Culture adequate volume   Culture   Final    NO GROWTH 1 DAY Performed at [facility], [address] [place], [place] [address]    Report Status PENDING  Incomplete  MRSA Next Gen by PCR, Nasal     Status: None   Collection Time: 06/28/21  5:31 AM   Specimen: Nasal Mucosa; Nasal Swab  Result Value Ref Range Status   MRSA by PCR Next Gen NOT DETECTED NOT DETECTED Final    Comment: (NOTE) The GeneXpert MRSA Assay (FDA approved for NASAL specimens only), is one component of a comprehensive MRSA colonization surveillance  program. It is not intended to diagnose MRSA infection nor to guide or monitor treatment for MRSA infections. Test performance is not FDA approved in patients less than 72 years old. Performed at [facility], [address] [place], [place] [address]     Coagulation Studies: Recent Labs    06/27/21 0226 06/27/21 0611  LABPROT 16.7* 16.6*  INR 1.4* 1.4*    Urinalysis: No results for input(s): COLORURINE, LABSPEC, PHURINE, GLUCOSEU, HGBUR, BILIRUBINUR, KETONESUR, PROTEINUR, UROBILINOGEN, NITRITE, LEUKOCYTESUR in the last 72 hours.  Invalid input(s): APPERANCEUR    Imaging: MR ANKLE RIGHT WO CONTRAST  Result Date: 06/28/2021 CLINICAL DATA:  Large  ulcer along the right heel with worsening pain. EXAM: MRI OF THE RIGHT ANKLE WITHOUT CONTRAST TECHNIQUE: Multiplanar, multisequence MR imaging of the ankle was performed. No intravenous contrast was administered. COMPARISON:  None. FINDINGS: TENDONS Peroneal: Peroneal longus tendon intact. Peroneal brevis intact. Posteromedial: Posterior tibial tendon intact. Flexor hallucis longus tendon intact. Flexor digitorum longus tendon intact. Anterior: Tibialis anterior tendon intact. Extensor hallucis longus tendon intact Extensor digitorum longus tendon intact. Achilles: Partial-thickness tear of the Achilles tendon along the medial calcaneal insertion. Plantar Fascia: Thickening of the medial band of the plantar fascia with increased signal consistent with plantar fasciitis. LIGAMENTS Lateral: Anterior talofibular ligament intact. Calcaneofibular ligament intact. Posterior talofibular ligament intact. Anterior and posterior tibiofibular ligaments intact. Medial: Deltoid ligament intact. Spring ligament intact. CARTILAGE Ankle Joint: No joint effusion. Normal ankle mortise. No chondral defect. Subtalar Joints/Sinus Tarsi: Normal subtalar joints. No subtalar joint effusion. Normal sinus tarsi. Bones and Soft Tissue: Large wound overlying the posterior aspect of the calcaneus. Mild subcortical bone marrow edema in the posterior calcaneus most consistent with osteomyelitis. No acute fracture or dislocation. Incidental note made of an os naviculare. T2 hyperintense signal throughout the plantar musculature likely neurogenic. IMPRESSION: 1. Large wound overlying the posterior aspect of the calcaneus. Mild subcortical bone marrow edema in the posterior calcaneus most consistent with osteomyelitis. 2. Partial-thickness tear of the Achilles tendon along the medial calcaneal insertion. 3. Thickening of the medial band of the plantar fascia with increased signal consistent with plantar fasciitis. Electronically Signed   By: Kathreen Devoid M.D.   On: 06/28/2021 06:59   DG Chest Portable 1 View  Result Date: 06/27/2021 CLINICAL DATA:  Fever and pressure wounds with elevated white blood cell count. EXAM: PORTABLE CHEST 1 VIEW COMPARISON:  Feb 28, 2021 FINDINGS: Multiple sternal wires are seen. Chronic airspace disease is seen throughout the mid and lower left lung which is unchanged in appearance. A stable, chronic loculated left pleural effusion is noted. No pneumothorax is identified. Stable vascular crowding is seen along the infrahilar region. A stable coarsely calcified left thyroid nodule is seen. A femoral catheter is seen with its distal tip at the level of the diaphragm, to the right of midline. The heart size and mediastinal contours are within normal limits. Degenerative changes are seen within the thoracic spine. IMPRESSION: 1. Chronic airspace disease throughout the mid and lower left lung with a stable chronic loculated left pleural effusion. 2. No new or acute findings. Electronically Signed   By: Virgina Norfolk M.D.   On: 06/27/2021 00:28   DG Foot Complete Right  Result Date: 06/27/2021 CLINICAL DATA:  Fever and pressure wounds with elevated white blood cell count. EXAM: RIGHT FOOT COMPLETE - 3+ VIEW COMPARISON:  None. FINDINGS: There is no evidence of acute fracture or dislocation. Soft tissue ulcerations are seen along the right heel  with adjacent area of cortical destruction noted along the right calcaneus. Marked severity vascular calcification is noted. IMPRESSION: Soft tissue ulcerations along the right heel with adjacent area of cortical destruction noted along the right calcaneus, suspicious for acute osteomyelitis. MRI correlation is recommended. Electronically Signed   By: Virgina Norfolk M.D.   On: 06/27/2021 00:30   ECHOCARDIOGRAM COMPLETE  Result Date: 06/27/2021    ECHOCARDIOGRAM REPORT   Patient Name:   Ernest [name] Date of Exam: 06/27/2021 Medical Rec #:  [id number]       Height:       69.0 in  Accession #:    [id number]      Weight:       180.8 lb Date of Birth:  Jan 27, 1946       BSA:          1.979 m Patient Age:    44 years        BP:           88/50 mmHg Patient Gender: M               HR:           67 bpm. Exam Location:  [facility] Procedure: 2D Echo, Color Doppler and Cardiac Doppler Indications:     CHF- acute systolic [id number]  History:         Patient has prior history of Echocardiogram examinations, most                  recent 09/13/2017. COPD; Risk Factors:Hypertension and                  Diabetes. DVT.  Sonographer:     Sherrie Sport Referring Phys:  [id number] JAN A MANSY Diagnosing Phys: Nelva Bush MD  Sonographer Comments: Suboptimal apical window. IMPRESSIONS  1. Left ventricular ejection fraction, by estimation, is 50 to 55%. The left ventricle has low normal function. Left ventricular endocardial border not optimally defined to evaluate regional wall motion. There is moderate left ventricular hypertrophy. Left ventricular diastolic parameters are consistent with Grade II diastolic dysfunction (pseudonormalization). Elevated left atrial pressure.  2. Right ventricular systolic function is normal. The right ventricular size is normal. Tricuspid regurgitation signal is inadequate for assessing PA pressure.  3. Left atrial size was mildly dilated.  4. Right atrial size was mildly dilated.  5. The mitral valve is degenerative. Trivial mitral valve regurgitation. No evidence of mitral stenosis. Moderate to severe mitral annular calcification.  6. The aortic valve is tricuspid. There is mild calcification of the aortic valve. There is mild thickening of the aortic valve. Aortic valve regurgitation is not visualized. Mild to moderate aortic valve sclerosis/calcification is present, without any evidence of aortic stenosis.  7. Aortic dilatation noted. There is borderline dilatation of the aortic root, measuring 38 mm.  8. The inferior vena cava is normal in size with <50% respiratory variability,  suggesting right atrial pressure of 8 mmHg. FINDINGS  Left Ventricle: Left ventricular ejection fraction, by estimation, is 50 to 55%. The left ventricle has low normal function. Left ventricular endocardial border not optimally defined to evaluate regional wall motion. The left ventricular internal cavity  size was normal in size. There is moderate left ventricular hypertrophy. Left ventricular diastolic parameters are consistent with Grade II diastolic dysfunction (pseudonormalization). Elevated left atrial pressure. Right Ventricle: The right ventricular size is normal. No increase in right ventricular wall thickness. Right ventricular systolic function is normal. Tricuspid regurgitation signal is inadequate  for assessing PA pressure. Left Atrium: Left atrial size was mildly dilated. Right Atrium: Right atrial size was mildly dilated. Pericardium: There is no evidence of pericardial effusion. Mitral Valve: The mitral valve is degenerative in appearance. There is mild thickening of the mitral valve leaflet(s). Moderate to severe mitral annular calcification. Trivial mitral valve regurgitation. No evidence of mitral valve stenosis. Tricuspid Valve: The tricuspid valve is not well visualized. Tricuspid valve regurgitation is trivial. Aortic Valve: The aortic valve is tricuspid. There is mild calcification of the aortic valve. There is mild thickening of the aortic valve. Aortic valve regurgitation is not visualized. Mild to moderate aortic valve sclerosis/calcification is present, without any evidence of aortic stenosis. Aortic valve mean gradient measures 2.0 mmHg. Aortic valve peak gradient measures 3.6 mmHg. Aortic valve area, by VTI measures 3.00 cm. Pulmonic Valve: The pulmonic valve was not well visualized. Pulmonic valve regurgitation is not visualized. No evidence of pulmonic stenosis. Aorta: Aortic dilatation noted. There is borderline dilatation of the aortic root, measuring 38 mm. Pulmonary Artery: The  pulmonary artery is not well seen. Venous: The inferior vena cava is normal in size with less than 50% respiratory variability, suggesting right atrial pressure of 8 mmHg. IAS/Shunts: The interatrial septum was not well visualized.  LEFT VENTRICLE PLAX 2D LVIDd:         4.06 cm  Diastology LVIDs:         3.01 cm  LV e' medial:    4.35 cm/s LV PW:         1.37 cm  LV E/e' medial:  21.3 LV IVS:        1.35 cm  LV e' lateral:   8.92 cm/s LVOT diam:     2.10 cm  LV E/e' lateral: 10.4 LV SV:         49 LV SV Index:   25 LVOT Area:     3.46 cm  LEFT ATRIUM             Index       RIGHT ATRIUM           Index LA diam:        3.50 cm 1.77 cm/m  RA Area:     18.40 cm LA Vol (A2C):   69.9 ml 35.32 ml/m RA Volume:   55.40 ml  27.99 ml/m LA Vol (A4C):   68.3 ml 34.51 ml/m LA Biplane Vol: 69.3 ml 35.02 ml/m  AORTIC VALVE                   PULMONIC VALVE AV Area (Vmax):    2.75 cm    PV Vmax:        0.61 m/s AV Area (Vmean):   2.37 cm    PV Peak grad:   1.5 mmHg AV Area (VTI):     3.00 cm    RVOT Peak grad: 2 mmHg AV Vmax:           94.70 cm/s AV Vmean:          65.200 cm/s AV VTI:            0.164 m AV Peak Grad:      3.6 mmHg AV Mean Grad:      2.0 mmHg LVOT Vmax:         75.10 cm/s LVOT Vmean:        44.700 cm/s LVOT VTI:          0.142 m LVOT/AV VTI ratio:  0.87  AORTA Ao Root diam: 3.80 cm MITRAL VALVE MV Area (PHT): 3.63 cm    SHUNTS MV Decel Time: 209 msec    Systemic VTI:  0.14 m MV E velocity: 92.50 cm/s  Systemic Diam: 2.10 cm MV A velocity: 84.00 cm/s MV E/A ratio:  1.10 Harrell Gave End MD Electronically signed by Nelva Bush MD Signature Date/Time: 06/27/2021/4:51:56 PM    Final      Medications:    sodium chloride     sodium chloride     ceFEPime (MAXIPIME) IV Stopped (06/27/21 1257)   metronidazole 500 mg (06/27/21 2336)   vancomycin 1,000 mg (06/28/21 1131)    allopurinol  100 mg Oral Daily   amiodarone  200 mg Oral Daily   atorvastatin  40 mg Oral Daily   Chlorhexidine Gluconate Cloth  6  each Topical Q0600   docusate sodium  100 mg Oral Daily   furosemide  60 mg Intravenous Q12H   heparin injection (subcutaneous)  5,000 Units Subcutaneous Q8H   insulin detemir  17 Units Subcutaneous QHS   levETIRAcetam  500 mg Oral BID   levETIRAcetam  500 mg Oral Once per day on Mon Wed Fri   mirtazapine  7.5 mg Oral QHS   polyethylene glycol  17 g Oral Daily   senna-docusate  1 tablet Oral QHS   sertraline  25 mg Oral QHS   sevelamer carbonate  800 mg Oral TID WC   sodium chloride, sodium chloride, acetaminophen **OR** acetaminophen, alteplase, feeding supplement (NEPRO CARB STEADY), heparin, lidocaine (PF), lidocaine-prilocaine, morphine injection, ondansetron **OR** ondansetron (ZOFRAN) IV, pentafluoroprop-tetrafluoroeth, traZODone  Assessment/ Plan:  [name] is a 76 y.o.  male with systolic congestive heart failure, COPD, CAD, GERD, Hepatitis C, hypertension, dyslipidemia and end stage renal disease on dialysis, who was admitted to [facility] on 06/26/2021 for Acute respiratory failure with hypoxia ([place]) [J96.01] Sepsis ([place]) [A41.9] Fever, unspecified fever cause [R50.9] Hypervolemia, unspecified hypervolemia type [E87.70] Osteomyelitis of right foot, unspecified type ([place]) [M86.9] Sepsis, due to unspecified organism, unspecified whether acute organ dysfunction present ([place]) [A41.9]  [facility] [address]/MWF/Rt groin Permcath  End stage renal disease on dialysis: Continue outpatient schedule during admission. Currently receiving dialysis, tolerating well    Anemia of chronic kidney disease  Hgb 9.1. Will monitor for need for ESA   3. Secondary Hyperparathyroidism:  Cinacalcet as outpatient  Calcium not at goal Continue Sevelamer with meals. Will obtain phosphorus with am labs   4. Diabetes mellitus type II with chronic kidney disease  insulin dependent. Home regimen includes levemir. Most recent hemoglobin A1c is 6.2 on 12/11/17. Glucose stable     LOS:  1   9/14/202211:44 AM

## 2021-06-28 NOTE — Op Note (Signed)
[facility]  Percutaneous Study/Intervention Procedural Note   Date of Surgery: 06/28/2021  Surgeon:  Katha Cabal, MD.  Pre-operative Diagnosis: Atherosclerotic occlusive disease bilateral lower extremities with gangrene of the right heel  Post-operative diagnosis:  Same  Procedure(s) Performed:             1.  Introduction catheter into right lower extremity 3rd order catheter placement              2.    Contrast injection right lower extremity for distal runoff             3.  Percutaneous transluminal angioplasty right common femoral artery.              4.  Star close closure left common femoral arteriotomy  Anesthesia: Conscious sedation was administered under my direct supervision by the interventional radiology RN. IV Versed plus fentanyl were utilized. Continuous ECG, pulse oximetry and blood pressure was monitored throughout the entire procedure.  Conscious sedation was for a total of 58 minutes.  Sheath: 6 [place] Ansell left common femoral retrograde  Contrast: 50 cc  Fluoroscopy Time: 3.4 minutes  Indications:  Ernest [name] presents with gangrenous changes of the right heel in association with nonpalpable pulses.  Patient has atherosclerotic occlusive disease and is at high risk for limb loss.  The risks and benefits are reviewed all questions answered patient agrees to proceed.  Procedure:  Ernest [name] is a 75 y.o. y.o. male who was identified and appropriate procedural time out was performed.  The patient was then placed supine on the table and prepped and draped in the usual sterile fashion.    Ultrasound was placed in the sterile sleeve and the left groin was evaluated the left common femoral artery was echolucent and pulsatile indicating patency.  Image was recorded for the permanent record and under real-time visualization a microneedle was inserted into the common femoral artery microwire followed by a micro-sheath.  A J-wire was  then advanced through the micro-sheath and a  5 [place] sheath was then inserted over a J-wire. J-wire was then advanced and a 5 French pigtail catheter was positioned at the level of T12. AP projection of the aorta was then obtained. Pigtail catheter was repositioned to above the bifurcation and a LAO view of the pelvis was obtained.  Subsequently a pigtail catheter with the stiff angle Glidewire was used to cross the aortic bifurcation the catheter wire were advanced down into the right distal external iliac artery. Oblique view of the femoral bifurcation was then obtained and subsequently the wire was reintroduced and the pigtail catheter negotiated into the SFA representing third order catheter placement. Distal runoff was then performed.  Diagnostic interpretation: The abdominal aorta is opacified with a bolus injection contrast.  There are mild atherosclerotic changes but there are no hemodynamically significant stenoses.  Renal arteries are poorly filled consistent with his end-stage renal disease.  Distal aorta is widely patent and the bilateral common internal and external iliac arteries are mildly calcified but there are no hemodynamically significant stenoses.  The right common femoral demonstrates a 70% stenosis that extends approximately 25 mm in length this is in the midportion of the common femoral.  The femoral bifurcation is actually spared and the ostia of both the SFA and profunda femoris are widely patent.  Profunda femoris and superficial femoral as well as popliteal arteries demonstrate increasing calcifications but there are no hemodynamically significant stenoses noted.  Trifurcation  demonstrates moderate to severe calcific disease the anterior tibial is widely patent throughout its course and is the dominant runoff to the foot filling the dorsalis pedis and the pedal arch.  The tibioperoneal trunk and peroneal are also patent with the peroneal extending down to the foot but it  collateralizes poorly across the ankle.  The posterior tibial demonstrates a focal 90% stenosis at its origin.  Then in its midportion it becomes increasingly diseased and is occluded in its distal one third with poor filling of the plantar vessels.  4000 units of heparin was then given and allowed to circulate and  an advantage wire is advanced through the pigtail catheter.  Pigtail catheter was removed and a 6 [place] Ansell sheath was advanced up and over the bifurcation and positioned in the femoral artery  A 4 mm x 40 mm ultra score balloon was used to angioplasty the common femoral artery. Inflation was to 10 atmospheres for 1 minute.  Next a 6 mm x 40 mm Lutonix drug-eluting balloon is advanced across the lesion inflated to 10 atm for 1 minute.  Follow-up imaging demonstrated patency with less than 10% residual stenosis. Distal runoff was then reassessed and found to be unchanged.  After review of these images the sheath is pulled into the left external iliac oblique of the common femoral is obtained and a Star close device deployed. There no immediate complications.   Findings:   The abdominal aorta is opacified with a bolus injection contrast.  There are mild atherosclerotic changes but there are no hemodynamically significant stenoses.  Renal arteries are poorly filled consistent with his end-stage renal disease.  Distal aorta is widely patent and the bilateral common internal and external iliac arteries are mildly calcified but there are no hemodynamically significant stenoses.  The right common femoral demonstrates a 70% stenosis that extends approximately 25 mm in length this is in the midportion of the common femoral.  The femoral bifurcation is actually spared and the ostia of both the SFA and profunda femoris are widely patent.  Profunda femoris and superficial femoral as well as popliteal arteries demonstrate increasing calcifications but there are no hemodynamically significant stenoses  noted.  Trifurcation demonstrates moderate to severe calcific disease the anterior tibial is widely patent throughout its course and is the dominant runoff to the foot filling the dorsalis pedis and the pedal arch.  The tibioperoneal trunk and peroneal are also patent with the peroneal extending down to the foot but it collateralizes poorly across the ankle.  The posterior tibial demonstrates a focal 90% stenosis at its origin.  Then in its midportion it becomes increasingly diseased and is occluded in its distal one third with poor filling of the plantar vessels.  Following angioplasty to 6 mm the right common femoral is now patent with less than 10% residual stenosis.   I do not feel that intervention within the posterior tibial is indicated given that the distal one third is occluded and there does not appear to be significant reconstitution of outflow through the plantars.  In other words, there is no distal target to revascularize and improve blood flow to.  Unfortunately this is the primary arterial filling of the heel and represents a poor prognosis overall for healing of his gangrenous wound.    Summary: Successful angioplasty of the right common femoral artery with the hope for right lower extremity for limb salvage  Disposition: Patient was taken to the recovery room in stable condition having tolerated the procedure well.  Arnold Depinto, Dolores Lory 06/28/2021,5:51 PM

## 2021-06-28 NOTE — NC FL2 (Signed)
[place] LEVEL OF CARE SCREENING TOOL     IDENTIFICATION  Patient Name: Ernest [name] Birthdate: 14-Dec-1945 Sex: male Admission Date (Current Location): 06/26/2021  [facility] and [place] Number:  Engineering geologist and Address:  [facility], [address], [place], [place] [address]      Provider Number: [id number]  Attending Physician Name and Address:  Mercy Riding, MD  Relative Name and Phone Number:       Current Level of Care: Hospital Recommended Level of Care: [place] Prior Approval Number:    Date Approved/Denied:   PASRR Number:    Discharge Plan: SNF    Current Diagnoses: Patient Active Problem List   Diagnosis Date Noted   Sepsis ([place]) 06/27/2021   Disorder of phosphorus metabolism, unspecified 02/20/2021   Hypercalcemia 12/08/2020   Atherosclerotic heart disease of native coronary artery without angina pectoris 11/18/2020   Diarrhea, unspecified 11/18/2020   Fever, unspecified 11/18/2020   Other fluid overload 11/18/2020   Pleural effusion on left 09/30/2019   Acute respiratory failure with hypoxia ([place]) 09/30/2019   Pleural effusion 09/30/2019   Chest pain 12/02/2018   Vascular dementia ([place]) 10/10/2017   Paroxysmal atrial flutter ([place]) 10/10/2017   Altered mental status, unspecified 10/09/2017   Other malaise 10/09/2017   S/P CABG x 4 10/04/2017   Cerebral embolism with cerebral infarction 09/20/2017   NSVT (nonsustained ventricular tachycardia) ([place])    Dyspnea 09/08/2017   Non-ST elevation MI (NSTEMI) ([place])    Acute on chronic combined systolic and diastolic heart failure ([place])    End-stage renal disease on hemodialysis ([place])    Left leg weakness    TIA (transient ischemic attack) 10/27/2016   Dependence on renal dialysis ([place]) 08/09/2015   History of CVA with residual deficit 08/07/2015   Seizure, late effect of stroke ([place]) 08/07/2015   Diabetic peripheral neuropathy ([place])  08/07/2015   Unspecified protein-calorie malnutrition ([place]) 05/27/2015   Coagulation defect, unspecified ([place]) 07/16/2014   Type 2 diabetes mellitus with diabetic peripheral angiopathy without gangrene ([place]) 07/01/2014   Pruritus, unspecified 05/26/2014   Elevated prostate specific antigen (PSA) 08/03/2013   Encounter for immunization 07/10/2013   Obstructive sleep apnea 06/16/2013   Hyperlipidemia associated with type 2 diabetes mellitus ([place])    ESRD (end stage renal disease) ([place])    Erectile dysfunction associated with type 2 diabetes mellitus ([place])    Hypertension due to end stage renal disease caused by type 2 diabetes mellitus, on dialysis ([place])    COPD (chronic obstructive pulmonary disease) ([place])    Chronic combined systolic and diastolic CHF (congestive heart failure) ([place])    Anemia in chronic kidney disease 12/27/2011   Long term (current) use of insulin ([place]) 11/03/2009   Gout, unspecified 02/28/2009   Iron deficiency anemia, unspecified 10/30/2008   Secondary hyperparathyroidism of renal origin ([place]) 10/30/2008   Uncontrolled type 2 diabetes mellitus with end-stage renal disease ([place]) 06/08/1984    Orientation RESPIRATION BLADDER Height & Weight     Self  O2 ([place] 4L) Continent Weight: 195 lb 9.6 oz (88.7 kg) Height:  '5\' 9"'$  (175.3 cm)  BEHAVIORAL SYMPTOMS/MOOD NEUROLOGICAL BOWEL NUTRITION STATUS      Incontinent Diet (NPO at this time, diet subject to change)  AMBULATORY STATUS COMMUNICATION OF NEEDS Skin   Extensive Assist Verbally PU Stage and Appropriate Care (Unstageble wound, right heel, foam dressing)  Personal Care Assistance Level of Assistance  Bathing, Dressing, Feeding Bathing Assistance: Maximum assistance Feeding assistance: Independent Dressing Assistance: Maximum assistance     Functional Limitations Info  Sight, Hearing, Speech Sight Info: Adequate Hearing Info: Adequate Speech Info: Adequate    SPECIAL CARE FACTORS  FREQUENCY  PT (By licensed PT), OT (By licensed OT)     PT Frequency: 5x OT Frequency: 5x            Contractures Contractures Info: Not present    Additional Factors Info  Code Status, Allergies Code Status Info: Full Code Allergies Info: no known allergies           Current Medications (06/28/2021):  This is the current hospital active medication list Current Facility-Administered Medications  Medication Dose Route Frequency Provider Last Rate Last Admin   0.9 %  sodium chloride infusion  100 mL Intravenous PRN Breeze, Shantelle, NP       0.9 %  sodium chloride infusion  100 mL Intravenous PRN Colon Flattery, NP       acetaminophen (TYLENOL) tablet 650 mg  650 mg Oral Q6H PRN Mansy, Jan A, MD       Or   acetaminophen (TYLENOL) suppository 650 mg  650 mg Rectal Q6H PRN Mansy, Jan A, MD       allopurinol (ZYLOPRIM) tablet 100 mg  100 mg Oral Daily Mansy, Jan A, MD   100 mg at 06/27/21 0834   alteplase (CATHFLO ACTIVASE) injection 2 mg  2 mg Intracatheter Once PRN Colon Flattery, NP       amiodarone (PACERONE) tablet 200 mg  200 mg Oral Daily Mansy, Jan A, MD   200 mg at 06/27/21 0837   atorvastatin (LIPITOR) tablet 40 mg  40 mg Oral Daily Mansy, Jan A, MD   40 mg at 06/27/21 0835   ceFEPIme (MAXIPIME) 1 g in sodium chloride 0.9 % 100 mL IVPB  1 g Intravenous Q24H Rauer, Forde Dandy, RPH   Stopped at 06/27/21 1257   Chlorhexidine Gluconate Cloth 2 % PADS 6 each  6 each Topical Q0600 Colon Flattery, NP   6 each at 06/28/21 0523   docusate sodium (COLACE) capsule 100 mg  100 mg Oral Daily Mansy, Jan A, MD   100 mg at 06/27/21 0839   feeding supplement (NEPRO CARB STEADY) liquid 237 mL  237 mL Oral Daily PRN Mansy, Jan A, MD       furosemide (LASIX) injection 60 mg  60 mg Intravenous Q12H Mansy, Jan A, MD   60 mg at 06/28/21 0644   heparin injection 1,000 Units  1,000 Units Dialysis PRN Colon Flattery, NP       heparin injection 5,000 Units  5,000 Units Subcutaneous Q8H  Mansy, Jan A, MD   5,000 Units at 06/28/21 0522   insulin detemir (LEVEMIR) injection 17 Units  17 Units Subcutaneous QHS Mansy, Jan A, MD   17 Units at 06/27/21 2123   levETIRAcetam (KEPPRA) tablet 500 mg  500 mg Oral BID Mansy, Jan A, MD   500 mg at 06/28/21 0853   levETIRAcetam (KEPPRA) tablet 500 mg  500 mg Oral Once per day on Mon Wed Fri Mansy, Jan A, MD       lidocaine (PF) (XYLOCAINE) 1 % injection 5 mL  5 mL Intradermal PRN Colon Flattery, NP       lidocaine-prilocaine (EMLA) cream 1 application  1 application Topical PRN Colon Flattery, NP       metroNIDAZOLE (FLAGYL) IVPB 500  mg  500 mg Intravenous Q12H Mansy, Jan A, MD 100 mL/hr at 06/27/21 2336 500 mg at 06/27/21 2336   mirtazapine (REMERON) tablet 7.5 mg  7.5 mg Oral QHS Mansy, Jan A, MD   7.5 mg at 06/27/21 2108   morphine 2 MG/ML injection 2 mg  2 mg Intravenous Q4H PRN Mansy, Jan A, MD       ondansetron [facility]) tablet 4 mg  4 mg Oral Q6H PRN Mansy, Jan A, MD       Or   ondansetron [facility]) injection 4 mg  4 mg Intravenous Q6H PRN Mansy, Arvella Merles, MD       pentafluoroprop-tetrafluoroeth (GEBAUERS) aerosol 1 application  1 application Topical PRN Colon Flattery, NP       polyethylene glycol (MIRALAX / GLYCOLAX) packet 17 g  17 g Oral Daily Mansy, Jan A, MD   17 g at 06/27/21 0840   senna-docusate (Senokot-S) tablet 1 tablet  1 tablet Oral QHS Mansy, Jan A, MD   1 tablet at 06/27/21 2109   sertraline (ZOLOFT) tablet 25 mg  25 mg Oral QHS Mansy, Jan A, MD   25 mg at 06/27/21 2108   sevelamer carbonate (RENVELA) tablet 800 mg  800 mg Oral TID WC Ralene Muskrat B, MD   800 mg at 06/28/21 0846   traZODone (DESYREL) tablet 25 mg  25 mg Oral QHS PRN Mansy, Jan A, MD   25 mg at 06/27/21 2108   vancomycin (VANCOCIN) IVPB 1000 mg/200 mL premix  1,000 mg Intravenous Q M,W,F-HD Rauer, Forde Dandy, [place]         Discharge Medications: Please see discharge summary for a list of discharge medications.  Relevant Imaging  Results:  Relevant Lab Results:   Additional Information [id number]  Eileen Stanford, LCSW

## 2021-06-28 NOTE — Consult Note (Signed)
[name] Vascular Consult Note  MRN : [id number]  Ernest [name] is [name] 75 y.o. (1946/01/12) male who presents with chief complaint of  Chief Complaint  Patient presents with   Fever   History of Present Illness:  Ernest [name] is [name] 75 year old African-American male with medical history significant for end-stage renal disease on hemodialysis Monday Wednesday and Friday, chronic systolic CHF, COPD, coronary artery disease, DVT, GERD, gout, hepatitis C, hypertension, dyslipidemia and CVA, who presented to emergency room with acute onset of fever with associated chills that started today at the skilled nursing facility.    The patient has [name] right heel decubitus ulcer for which has been getting oral antibiotics however the wound has been getting progressively worse.  His right heel pain has been sharp and throbbing.  He was noted to have leukocytosis at his nursing home.  His roommate tested positive for COVID-19 however the patient tested negative there.  He denies any cough or wheezing or dyspnea or chest pain or palpitations.  He was noted to be hypoxic however.  No nausea or vomiting or abdominal pain.  ED Course: Upon presentation to the ER, temperature 100.1 and pulse ox 90 was 97% on 4 L of O2 by nasal cannula, vital signs otherwise within normal.  Later on respiratory rate was up to 26.  Labs revealed mild hyperkalemia 5.3 and hyperglycemia 330 with [name] BUN of 62 and creatinine of 7.02 and albumin 2.4.  Lactic acid 3.1 and later 2.9 with procalcitonin of 13.2.  CBC showed leukocytosis 19.6 with neutrophilia and anemia.  INR was 1.4 with PT of 16.7 and PTT 34.  Influenza antigens and COVID-19 PCR came back negative.  Blood cultures were drawn.    Right foot x-ray revealed soft tissue ulceration along the right heel with adjacent area of cortical destruction noted in the right calcaneus suspicious for acute osteomyelitis.  Vascular surgery was consulted in the setting  of chronic right heel wound by Dr. Priscella Mann.   Current Facility-Administered Medications  Medication Dose Route Frequency Provider Last Rate Last Admin   0.9 %  sodium chloride infusion  100 mL Intravenous PRN Breeze, Shantelle, Ernest [name]       0.9 %  sodium chloride infusion  100 mL Intravenous PRN Ernest Flattery, Ernest [name]       acetaminophen (TYLENOL) tablet 650 mg  650 mg Oral Q6H PRN Ernest [name], Ernest [name], Ernest [name]       Or   acetaminophen (TYLENOL) suppository 650 mg  650 mg Rectal Q6H PRN Ernest [name], Ernest [name], Ernest [name]       allopurinol (ZYLOPRIM) tablet 100 mg  100 mg Oral Daily Ernest [name], Ernest [name], Ernest [name]   100 mg at 06/27/21 0834   alteplase (CATHFLO ACTIVASE) injection 2 mg  2 mg Intracatheter Once PRN Ernest Flattery, Ernest [name]       amiodarone (PACERONE) tablet 200 mg  200 mg Oral Daily Ernest [name], Ernest [name], Ernest [name]   200 mg at 06/27/21 0837   atorvastatin (LIPITOR) tablet 40 mg  40 mg Oral Daily Ernest [name], Ernest [name], Ernest [name]   40 mg at 06/27/21 0835   ceFEPIme (MAXIPIME) 1 g in sodium chloride 0.9 % 100 mL IVPB  1 g Intravenous Q24H Rauer, Ernest [name], Ernest [name]   Stopped at 06/27/21 1257   Chlorhexidine Gluconate Cloth 2 % PADS 6 each  6 each Topical Q0600 Ernest Flattery, Ernest [name]   6 each at 06/28/21 0523   docusate sodium (COLACE) capsule 100 mg  100  mg Oral Daily Ernest [name], Ernest [name], Ernest [name]   100 mg at 06/27/21 0839   feeding supplement (NEPRO CARB STEADY) liquid 237 mL  237 mL Oral Daily PRN Ernest [name], Ernest [name], Ernest [name]       furosemide (LASIX) injection 60 mg  60 mg Intravenous Q12H Ernest [name], Ernest [name], Ernest [name]   60 mg at 06/28/21 0644   heparin injection 1,000 Units  1,000 Units Dialysis PRN Ernest Flattery, Ernest [name]       heparin injection 5,000 Units  5,000 Units Subcutaneous Q8H Ernest [name], Ernest [name], Ernest [name]   5,000 Units at 06/28/21 0522   insulin detemir (LEVEMIR) injection 17 Units  17 Units Subcutaneous QHS Ernest [name], Ernest [name], Ernest [name]   17 Units at 06/27/21 2123   levETIRAcetam (KEPPRA) tablet 500 mg  500 mg Oral BID Ernest [name], Ernest [name], Ernest [name]   500 mg at 06/28/21 [id number]   levETIRAcetam (KEPPRA) tablet 500 mg  500 mg Oral Once per  day on Mon Wed Fri Ernest [name], Ernest [name], Ernest [name]       lidocaine (PF) (XYLOCAINE) 1 % injection 5 mL  5 mL Intradermal PRN Ernest Flattery, Ernest [name]       lidocaine-prilocaine (EMLA) cream 1 application  1 application Topical PRN Ernest Flattery, Ernest [name]       metroNIDAZOLE (FLAGYL) IVPB 500 mg  500 mg Intravenous Q12H Ernest [name], Ernest [name], Ernest [name] 100 mL/hr at 06/27/21 2336 500 mg at 06/27/21 2336   mirtazapine (REMERON) tablet 7.5 mg  7.5 mg Oral QHS Ernest [name], Ernest [name], Ernest [name]   7.5 mg at 06/27/21 2108   morphine 2 MG/ML injection 2 mg  2 mg Intravenous Q4H PRN Ernest [name], Ernest [name], Ernest [name]       ondansetron [facility]) tablet 4 mg  4 mg Oral Q6H PRN Ernest [name], Ernest [name], Ernest [name]       Or   ondansetron [name]) injection 4 mg  4 mg Intravenous Q6H PRN Ernest [name], Arvella Merles, Ernest [name]       pentafluoroprop-tetrafluoroeth (GEBAUERS) aerosol 1 application  1 application Topical PRN Ernest Flattery, Ernest [name]       polyethylene glycol (MIRALAX / GLYCOLAX) packet 17 g  17 g Oral Daily Ernest [name], Ernest [name], Ernest [name]   17 g at 06/27/21 0840   senna-docusate (Senokot-S) tablet 1 tablet  1 tablet Oral QHS Ernest [name], Ernest [name], Ernest [name]   1 tablet at 06/27/21 2109   sertraline (ZOLOFT) tablet 25 mg  25 mg Oral QHS Ernest [name], Ernest [name], Ernest [name]   25 mg at 06/27/21 2108   sevelamer carbonate (RENVELA) tablet 800 mg  800 mg Oral TID WC Ralene Muskrat B, Ernest [name]   800 mg at 06/28/21 0846   traZODone (DESYREL) tablet 25 mg  25 mg Oral QHS PRN Ernest [name], Ernest [name], Ernest [name]   25 mg at 06/27/21 2108   vancomycin (VANCOCIN) IVPB 1000 mg/200 mL premix  1,000 mg Intravenous Q M,W,F-HD Rauer, Ernest [name], Ernest [name] 200 mL/hr at 06/28/21 1131 1,000 mg at 06/28/21 1131   Past Medical History:  Diagnosis Date   Abnormal stress test    s/p cath November 2013 with modest disease involving the ostial left main, proximal LAD, proximal RCA - do not appear to be hemodynamically signficant; mild LV dysfunction   Anemia    low iron   Anxiety    Arthritis    Bacteremia    Chronic systolic CHF (congestive heart failure) (HCC)    COPD (chronic obstructive pulmonary disease)  ([place])    Coronary artery disease    [name]. per cath report 2013, b. 09/13/2017-CABG X4 &  Mitral valve repair   Depression    DVT (deep venous thrombosis) (HCC)    arm - right- finished blood thinner- not sure when it   Ejection fraction < 50%    Erectile dysfunction    penile implant   ESRD (end stage renal disease) ([place])    [facility] on [name] MWF schedule.      Gout    Hx: of   Hepatitis C    History of seizures    last one 2018   Hyperlipidemia    Hypertension    Mitral valve disease    s/p Mitral repair 09/13/17   Obesity    Retinopathy    Shortness of breath    Hx: of with exertion   Stroke [name])    "several" left side weakness, speech affected   Type II or unspecified type diabetes mellitus without mention of complication, not stated as uncontrolled    adult onset   Past Surgical History:  Procedure Laterality Date   [name]/V FISTULAGRAM N/[name] 10/04/2017   Procedure: [name]/V FISTULAGRAM;  Surgeon: Elam Dutch, Ernest [name];  Location: [place] [facility];  Service: Cardiovascular;  Laterality: N/[name];   [name]/V SHUNTOGRAM Left 07/12/2020   Procedure: [name]/V SHUNTOGRAM;  Surgeon: Serafina Mitchell, Ernest [name];  Location: [place] [facility];  Service: Cardiovascular;  Laterality: Left;   ANGIOPLASTY Left 09/24/2019   Procedure: Angioplasty Innominate Vien;  Surgeon: Serafina Mitchell, Ernest [name];  Location: [place];  Service: Vascular;  Laterality: Left;   AV FISTULA PLACEMENT Left 01/13/2013   Procedure: INSERTION OF ARTERIOVENOUS (AV) GORE-TEX GRAFT ARM;  Surgeon: Angelia Mould, Ernest [name];  Location: [place];  Service: Vascular;  Laterality: Left;   AV FISTULA PLACEMENT Left 05/05/2013   Procedure: INSERTION OF LEFT UPPER ARM  ARTERIOVENOUS GORTEX GRAFT;  Surgeon: Angelia Mould, Ernest [name];  Location: [place];  Service: Vascular;  Laterality: Left;   AV FISTULA PLACEMENT Left 11/26/2019   Procedure: INSERTION OF ARTERIOVENOUS (AV) GORE-TEX GRAFT, left  ARM;  Surgeon: Serafina Mitchell, Ernest [name];  Location: [place];  Service: Vascular;   Laterality: Left;   [place] REMOVAL Left 03/26/2013   Procedure: REMOVAL OF  NON INCORPORATED ARTERIOVENOUS GORETEX GRAFT ([place]) left arm * repair of  left brachial artery with vein patch angioplasty.;  Surgeon: Angelia Mould, Ernest [name];  Location: [place];  Service: Vascular;  Laterality: Left;   CARDIAC CATHETERIZATION  August 26, 2012   CATARACT EXTRACTION W/ INTRAOCULAR LENS  IMPLANT, BILATERAL     COLONOSCOPY     CORONARY ARTERY BYPASS GRAFT N/[name] 09/13/2017   Procedure: CORONARY ARTERY BYPASS GRAFTING (CABG) times four LIMA  to LAD, SVG sequentially to OM and RAMUS Intermediate and SVG to Acute Marginal;  Surgeon: Melrose Nakayama, Ernest [name];  Location: [place];  Service: Open Heart Surgery;  Laterality: N/[name];   DIALYSIS/PERMA CATHETER INSERTION N/[name] 02/28/2021   Procedure: DIALYSIS/PERMA CATHETER EXCHANGE;  Surgeon: Katha Cabal, Ernest [name];  Location: [place] [facility];  Service: Cardiovascular;  Laterality: N/[name];   EMBOLECTOMY Right 08/27/2013   Procedure: EMBOLECTOMY;  Surgeon: Mal Misty, Ernest [name];  Location: [place];  Service: Vascular;  Laterality: Right;  Thrombectomy of Radial and ulnar artery.   ENDOVASCULAR STENT INSERTION Left 09/24/2019   Procedure: Endovascular Stent Graft Insertion, Arm Graft;  Surgeon: Serafina Mitchell, Ernest [name];  Location: [facility];  Service: Vascular;  Laterality: Left;   EYE SURGERY     laser.  and surgery for DM   fistula  RUE and wrist   FISTULOGRAM Left 09/24/2019   Procedure: SHUNTOGRAM OF ARTERIOVENOUS FISTULA LEFT ARM,;  Surgeon: Serafina Mitchell, Ernest [name];  Location: [place];  Service: Vascular;  Laterality: Left;   INSERTION OF DIALYSIS CATHETER Right 01/01/2013   Procedure: INSERTION OF DIALYSIS CATHETER right  internal jugular;  Surgeon: Serafina Mitchell, Ernest [name];  Location: [place];  Service: Vascular;  Laterality: Right;   INSERTION OF DIALYSIS CATHETER N/[name] 03/26/2013   Procedure: INSERTION OF DIALYSIS CATHETER Left internal jugular vein;  Surgeon: Angelia Mould, Ernest [name];   Location: [place];  Service: Vascular;  Laterality: N/[name];   INSERTION OF DIALYSIS CATHETER Right 11/26/2019   Procedure: INSERTION OF DIALYSIS CATHETER, right internal jugular;  Surgeon: Serafina Mitchell, Ernest [name];  Location: [place];  Service: Vascular;  Laterality: Right;   IR THORACENTESIS ASP PLEURAL SPACE W/IMG GUIDE  09/30/2019   LEFT HEART CATH AND CORONARY ANGIOGRAPHY N/[name] 09/09/2017   Procedure: LEFT HEART CATH AND CORONARY ANGIOGRAPHY;  Surgeon: Troy Sine, Ernest [name];  Location: [place] CV [facility];  Service: Cardiovascular;  Laterality: N/[name];   LIGATION OF ARTERIOVENOUS  FISTULA Right 12/30/2013   Procedure: REMOVAL OF SEGMENT OF GORTEX GRAFT AND FISTULA  AND REPAIR OF BRACHIAL ARTERY;  Surgeon: Mal Misty, Ernest [name];  Location: [place];  Service: Vascular;  Laterality: Right;   MITRAL VALVE REPAIR N/[name] 09/13/2017   Procedure: MITRAL VALVE REPAIR (MVR);  Surgeon: Melrose Nakayama, Ernest [name];  Location: [place];  Service: Open Heart Surgery;  Laterality: N/[name];   MITRAL VALVE REPAIR (MV)/CORONARY ARTERY BYPASS GRAFTING (CABG)  09/13/2017   PENILE PROSTHESIS IMPLANT     1997.. no card   PERIPHERAL VASCULAR BALLOON ANGIOPLASTY Left 10/04/2017   Procedure: PERIPHERAL VASCULAR BALLOON ANGIOPLASTY;  Surgeon: Elam Dutch, Ernest [name];  Location: [place] [facility];  Service: Cardiovascular;  Laterality: Left;  AVF   PERIPHERAL VASCULAR BALLOON ANGIOPLASTY Left 07/12/2020   Procedure: PERIPHERAL VASCULAR BALLOON ANGIOPLASTY;  Surgeon: Serafina Mitchell, Ernest [name];  Location: [place] [facility];  Service: Cardiovascular;  Laterality: Left;  ARM SHUNTOGRAM   REVISION OF ARTERIOVENOUS GORETEX GRAFT Left 09/24/2019   Procedure: REVISION OF ARTERIOVENOUS GORETEX GRAFT LEFT ARM;  Surgeon: Serafina Mitchell, Ernest [name];  Location: [facility];  Service: Vascular;  Laterality: Left;   TEE WITHOUT CARDIOVERSION N/[name] 06/11/2013   Procedure: TRANSESOPHAGEAL ECHOCARDIOGRAM (TEE);  Surgeon: Larey Dresser, Ernest [name];  [name];  Service: Cardiovascular;   Laterality: N/[name];   TEE WITHOUT CARDIOVERSION N/[name] 09/13/2017   Procedure: TRANSESOPHAGEAL ECHOCARDIOGRAM (TEE);  Surgeon: Melrose Nakayama, Ernest [name];  Location: [place];  Service: Open Heart Surgery;  Laterality: N/[name];   THROMBECTOMY W/ EMBOLECTOMY Left 03/26/2013   Procedure: Attempted thrombectomy of left arm arteriovenous goretex graft.;  Surgeon: Angelia Mould, Ernest [name];  Location: [facility] OR;  Service: Vascular;  Laterality: Left;   ULTRASOUND GUIDANCE FOR VASCULAR ACCESS Bilateral 09/24/2019   Procedure: Ultrasound Guidance For Vascular Access, Right Femoral Vein and Left Arm Graft;  Surgeon: Serafina Mitchell, Ernest [name];  Location: [name];  Service: Vascular;  Laterality: Bilateral;   VENOGRAM N/[name] 04/07/2013   Procedure: VENOGRAM;  Surgeon: Serafina Mitchell, Ernest [name];  Location: [facility] CATH LAB;  Service: Cardiovascular;  Laterality: N/[name];   Social History Social History   Tobacco Use   Smoking status: Former    Years: 7.00    Types: Cigarettes    Quit date: 06/10/1973    Years since quitting: 48.0   Smokeless tobacco: Never   Tobacco comments:  Quit in 1974.  Vaping Use   Vaping Use: Never used  Substance Use Topics   Alcohol use: No    Alcohol/week: 0.0 standard drinks   Drug use: No   Family History Family History  Problem Relation Age of Onset   Heart disease Father    Diabetes Sister    Diabetes Brother    Diabetes Son   Denies family history of peripheral artery disease, venous disease or renal disease.  [name]  REVIEW OF SYSTEMS (Negative unless checked)  Constitutional: '[]'$ Weight loss  '[]'$ Fever  '[]'$ Chills Cardiac: '[]'$ Chest pain   '[]'$ Chest pressure   '[]'$ Palpitations   '[]'$ Shortness of breath when laying flat   '[]'$ Shortness of breath at rest   '[]'$ Shortness of breath with exertion. Vascular:  '[]'$ Pain in legs with walking   '[]'$ Pain in legs at rest   '[]'$ Pain in legs when laying flat   '[]'$ Claudication   '[x]'$ Pain in feet when walking  '[x]'$ Pain in feet at rest  '[x]'$ Pain in feet when laying flat    '[]'$ History of DVT   '[]'$ Phlebitis   '[]'$ Swelling in legs   '[]'$ Varicose veins   '[x]'$ Non-healing ulcers Pulmonary:   '[]'$ Uses home oxygen   '[]'$ Productive cough   '[]'$ Hemoptysis   '[]'$ Wheeze  '[]'$ COPD   '[]'$ Asthma Neurologic:  '[]'$ Dizziness  '[]'$ Blackouts   '[]'$ Seizures   '[]'$ History of stroke   '[]'$ History of TIA  '[]'$ Aphasia   '[]'$ Temporary blindness   '[]'$ Dysphagia   '[]'$ Weakness or numbness in arms   '[]'$ Weakness or numbness in legs Musculoskeletal:  '[]'$ Arthritis   '[]'$ Joint swelling   '[]'$ Joint pain   '[]'$ Low back pain Hematologic:  '[]'$ Easy bruising  '[]'$ Easy bleeding   '[]'$ Hypercoagulable state   '[]'$ Anemic  '[]'$ Hepatitis Gastrointestinal:  '[]'$ Blood in stool   '[]'$ Vomiting blood  '[]'$ Gastroesophageal reflux/heartburn   '[]'$ Difficulty swallowing. Genitourinary:  '[x]'$ Chronic kidney disease   '[]'$ Difficult urination  '[]'$ Frequent urination  '[]'$ Burning with urination   '[]'$ Blood in urine Skin:  '[]'$ Rashes   '[x]'$ Ulcers   '[x]'$ Wounds Psychological:  '[]'$ History of anxiety   '[]'$  History of major depression.  Physical Examination  Vitals:   06/28/21 1145 06/28/21 1200 06/28/21 1215 06/28/21 1230  BP: (!) 108/55 (!) 102/56 (!) 99/56 121/66  Pulse: 74 76 73 72  Resp: '18 19 17 '$ (!) 25  Temp:      TempSrc:      SpO2: 100%     Weight:      Height:       Body mass index is 28.89 kg/m. Gen:  WD/WN, NAD Head: [place]/AT, No temporalis wasting. Prominent temp pulse not noted. Ear/Nose/Throat: Hearing grossly intact, nares w/o erythema or drainage, oropharynx w/o Erythema/Exudate Eyes: Sclera non-icteric, conjunctiva clear Neck: Trachea midline.  No JVD.  Pulmonary:  Good air movement, respirations not labored, equal bilaterally.  Cardiac: RRR, normal S1, S2. Vascular: Vessel Right Left  Radial Palpable Palpable  Ulnar Palpable Palpable  Brachial Palpable Palpable  Carotid Palpable, without bruit Palpable, without bruit  Aorta Not palpable N/[name]  Femoral Palpable Palpable  Popliteal Palpable Palpable  PT Non-Palpable Non-Palpable  DP Non-Palpable Non-Palpable      Gastrointestinal: soft, non-tender/non-distended. No guarding/reflex.  Musculoskeletal: M/S 5/5 throughout.  Extremities without ischemic changes.  No deformity or atrophy. No edema. Neurologic: Sensation grossly intact in extremities.  Symmetrical.  Speech is fluent. Motor exam as listed above. Psychiatric: Judgment intact, Mood & affect appropriate for pt's clinical situation. Dermatologic: Large necrotic heel ulceration to the right foot  Lymph : No Cervical, Axillary, or Inguinal lymphadenopathy.  CBC Lab Results  Component Value Date   WBC 19.6 (H) 06/27/2021   HGB 9.1 (L) 06/27/2021   HCT 29.1 (L) 06/27/2021   MCV 87.9 06/27/2021   PLT 267 06/27/2021   BMET    Component Value Date/Time   NA 137 06/28/2021 0311   NA 140 12/11/2017 0000   K 4.9 06/28/2021 0311   CL 97 (L) 06/28/2021 0311   CO2 25 06/28/2021 0311   GLUCOSE 91 06/28/2021 0311   BUN 84 (H) 06/28/2021 0311   BUN 16 12/11/2017 0000   CREATININE 8.67 (H) 06/28/2021 0311   CALCIUM 8.3 (L) 06/28/2021 0311   CALCIUM 8.4 (L) 10/05/2017 0615   GFRNONAA 6 (L) 06/28/2021 0311   GFRAA 21 (L) 12/04/2019 2027   Estimated Creatinine Clearance: 8.5 mL/min ([name]) (by C-G formula based on SCr of 8.67 mg/dL (H)).  COAG Lab Results  Component Value Date   INR 1.4 (H) 06/27/2021   INR 1.4 (H) 06/27/2021   INR 1.0 12/16/2020   PROTIME 25.4 ([name]) 12/27/2017   PROTIME 33.5 ([name]) 12/24/2017   PROTIME 21.5 ([name]) 12/11/2017   Radiology MR ANKLE RIGHT WO CONTRAST  Result Date: 06/28/2021 CLINICAL DATA:  Large ulcer along the right heel with worsening pain. EXAM: MRI OF THE RIGHT ANKLE WITHOUT CONTRAST TECHNIQUE: Multiplanar, multisequence MR imaging of the ankle was performed. No intravenous contrast was administered. COMPARISON:  None. FINDINGS: TENDONS Peroneal: Peroneal longus tendon intact. Peroneal brevis intact. Posteromedial: Posterior tibial tendon intact. Flexor hallucis longus tendon intact. Flexor digitorum longus  tendon intact. Anterior: Tibialis anterior tendon intact. Extensor hallucis longus tendon intact Extensor digitorum longus tendon intact. Achilles: Partial-thickness tear of the Achilles tendon along the medial calcaneal insertion. Plantar Fascia: Thickening of the medial band of the plantar fascia with increased signal consistent with plantar fasciitis. LIGAMENTS Lateral: Anterior talofibular ligament intact. Calcaneofibular ligament intact. Posterior talofibular ligament intact. Anterior and posterior tibiofibular ligaments intact. Medial: Deltoid ligament intact. Spring ligament intact. CARTILAGE Ankle Joint: No joint effusion. Normal ankle mortise. No chondral defect. Subtalar Joints/Sinus Tarsi: Normal subtalar joints. No subtalar joint effusion. Normal sinus tarsi. Bones and Soft Tissue: Large wound overlying the posterior aspect of the calcaneus. Mild subcortical bone marrow edema in the posterior calcaneus most consistent with osteomyelitis. No acute fracture or dislocation. Incidental note made of an os naviculare. T2 hyperintense signal throughout the plantar musculature likely neurogenic. IMPRESSION: 1. Large wound overlying the posterior aspect of the calcaneus. Mild subcortical bone marrow edema in the posterior calcaneus most consistent with osteomyelitis. 2. Partial-thickness tear of the Achilles tendon along the medial calcaneal insertion. 3. Thickening of the medial band of the plantar fascia with increased signal consistent with plantar fasciitis. Electronically Signed   By: Kathreen Devoid M.D.   On: 06/28/2021 06:59   DG Chest Portable 1 View  Result Date: 06/27/2021 CLINICAL DATA:  Fever and pressure wounds with elevated white blood cell count. EXAM: PORTABLE CHEST 1 VIEW COMPARISON:  Feb 28, 2021 FINDINGS: Multiple sternal wires are seen. Chronic airspace disease is seen throughout the mid and lower left lung which is unchanged in appearance. [name] stable, chronic loculated left pleural effusion  is noted. No pneumothorax is identified. Stable vascular crowding is seen along the infrahilar region. [name] stable coarsely calcified left thyroid nodule is seen. [name] femoral catheter is seen with its distal tip at the level of the diaphragm, to the right of midline. The heart size and mediastinal contours are within normal limits. Degenerative changes are seen within the thoracic spine. IMPRESSION:  1. Chronic airspace disease throughout the mid and lower left lung with [name] stable chronic loculated left pleural effusion. 2. No new or acute findings. Electronically Signed   By: Virgina Norfolk M.D.   On: 06/27/2021 00:28   DG Foot Complete Right  Result Date: 06/27/2021 CLINICAL DATA:  Fever and pressure wounds with elevated white blood cell count. EXAM: RIGHT FOOT COMPLETE - 3+ VIEW COMPARISON:  None. FINDINGS: There is no evidence of acute fracture or dislocation. Soft tissue ulcerations are seen along the right heel with adjacent area of cortical destruction noted along the right calcaneus. Marked severity vascular calcification is noted. IMPRESSION: Soft tissue ulcerations along the right heel with adjacent area of cortical destruction noted along the right calcaneus, suspicious for acute osteomyelitis. MRI correlation is recommended. Electronically Signed   By: Virgina Norfolk M.D.   On: 06/27/2021 00:30   ECHOCARDIOGRAM COMPLETE  Result Date: 06/27/2021    ECHOCARDIOGRAM REPORT   Patient Name:   MAUDE KULICH Date of Exam: 06/27/2021 Medical Rec #:  [id number]       Height:       69.0 in Accession #:    [id number]      Weight:       180.8 lb Date of Birth:  [birth year]       BSA:          1.979 m Patient Age:    48 years        BP:           88/50 mmHg Patient Gender: M               HR:           67 bpm. Exam Location:  [facility] Procedure: 2D Echo, Color Doppler and Cardiac Doppler Indications:     CHF- acute systolic [id number]  History:         Patient has prior history of Echocardiogram examinations, most                   recent 09/13/2017. COPD; Risk Factors:Hypertension and                  Diabetes. DVT.  Sonographer:     Sherrie Sport Referring Phys:  [id number] Ernest [name] Ernest [name] Diagnosing Phys: Nelva Bush Ernest [name]  Sonographer Comments: Suboptimal apical window. IMPRESSIONS  1. Left ventricular ejection fraction, by estimation, is 50 to 55%. The left ventricle has low normal function. Left ventricular endocardial border not optimally defined to evaluate regional wall motion. There is moderate left ventricular hypertrophy. Left ventricular diastolic parameters are consistent with Grade II diastolic dysfunction (pseudonormalization). Elevated left atrial pressure.  2. Right ventricular systolic function is normal. The right ventricular size is normal. Tricuspid regurgitation signal is inadequate for assessing PA pressure.  3. Left atrial size was mildly dilated.  4. Right atrial size was mildly dilated.  5. The mitral valve is degenerative. Trivial mitral valve regurgitation. No evidence of mitral stenosis. Moderate to severe mitral annular calcification.  6. The aortic valve is tricuspid. There is mild calcification of the aortic valve. There is mild thickening of the aortic valve. Aortic valve regurgitation is not visualized. Mild to moderate aortic valve sclerosis/calcification is present, without any evidence of aortic stenosis.  7. Aortic dilatation noted. There is borderline dilatation of the aortic root, measuring 38 mm.  8. The inferior vena cava is normal in size with <50% respiratory variability, suggesting right atrial pressure of 8 mmHg. FINDINGS  Left Ventricle: Left ventricular ejection fraction, by estimation, is 50 to 55%. The left ventricle has low normal function. Left ventricular endocardial border not optimally defined to evaluate regional wall motion. The left ventricular internal cavity  size was normal in size. There is moderate left ventricular hypertrophy. Left ventricular diastolic parameters are  consistent with Grade II diastolic dysfunction (pseudonormalization). Elevated left atrial pressure. Right Ventricle: The right ventricular size is normal. No increase in right ventricular wall thickness. Right ventricular systolic function is normal. Tricuspid regurgitation signal is inadequate for assessing PA pressure. Left Atrium: Left atrial size was mildly dilated. Right Atrium: Right atrial size was mildly dilated. Pericardium: There is no evidence of pericardial effusion. Mitral Valve: The mitral valve is degenerative in appearance. There is mild thickening of the mitral valve leaflet(s). Moderate to severe mitral annular calcification. Trivial mitral valve regurgitation. No evidence of mitral valve stenosis. Tricuspid Valve: The tricuspid valve is not well visualized. Tricuspid valve regurgitation is trivial. Aortic Valve: The aortic valve is tricuspid. There is mild calcification of the aortic valve. There is mild thickening of the aortic valve. Aortic valve regurgitation is not visualized. Mild to moderate aortic valve sclerosis/calcification is present, without any evidence of aortic stenosis. Aortic valve mean gradient measures 2.0 mmHg. Aortic valve peak gradient measures 3.6 mmHg. Aortic valve area, by VTI measures 3.00 cm. Pulmonic Valve: The pulmonic valve was not well visualized. Pulmonic valve regurgitation is not visualized. No evidence of pulmonic stenosis. Aorta: Aortic dilatation noted. There is borderline dilatation of the aortic root, measuring 38 mm. Pulmonary Artery: The pulmonary artery is not well seen. Venous: The inferior vena cava is normal in size with less than 50% respiratory variability, suggesting right atrial pressure of 8 mmHg. IAS/Shunts: The interatrial septum was not well visualized.  LEFT VENTRICLE PLAX 2D LVIDd:         4.06 cm  Diastology LVIDs:         3.01 cm  LV e' medial:    4.35 cm/s LV PW:         1.37 cm  LV E/e' medial:  21.3 LV IVS:        1.35 cm  LV e'  lateral:   8.92 cm/s LVOT diam:     2.10 cm  LV E/e' lateral: 10.4 LV SV:         49 LV SV Index:   25 LVOT Area:     3.46 cm  LEFT ATRIUM             Index       RIGHT ATRIUM           Index LA diam:        3.50 cm 1.77 cm/m  RA Area:     18.40 cm LA Vol (A2C):   69.9 ml 35.32 ml/m RA Volume:   55.40 ml  27.99 ml/m LA Vol (A4C):   68.3 ml 34.51 ml/m LA Biplane Vol: 69.3 ml 35.02 ml/m  AORTIC VALVE                   PULMONIC VALVE AV Area (Vmax):    2.75 cm    PV Vmax:        0.61 m/s AV Area (Vmean):   2.37 cm    PV Peak grad:   1.5 mmHg AV Area (VTI):     3.00 cm    RVOT Peak grad: 2 mmHg AV Vmax:  94.70 cm/s AV Vmean:          65.200 cm/s AV VTI:            0.164 m AV Peak Grad:      3.6 mmHg AV Mean Grad:      2.0 mmHg LVOT Vmax:         75.10 cm/s LVOT Vmean:        44.700 cm/s LVOT VTI:          0.142 m LVOT/AV VTI ratio: 0.87  AORTA Ao Root diam: 3.80 cm MITRAL VALVE MV Area (PHT): 3.63 cm    SHUNTS MV Decel Time: 209 msec    Systemic VTI:  0.14 m MV E velocity: 92.50 cm/s  Systemic Diam: 2.10 cm MV [name] velocity: 84.00 cm/s MV E/[name] ratio:  1.10 Nelva Bush Ernest [name] Electronically signed by Nelva Bush Ernest [name] Signature Date/Time: 06/27/2021/4:51:56 PM    Final     Assessment/Plan CAESARE ALBAN is [name] 75 year old African-American male with medical history significant for end-stage renal disease on hemodialysis Monday Wednesday and Friday, chronic systolic CHF, COPD, coronary artery disease, DVT, GERD, gout, hepatitis C, hypertension, dyslipidemia and CVA, who presented to emergency room with acute onset of fever with associated chills that started today at the skilled nursing facility.   1.  Possible atherosclerotic disease with ulceration: Patient with multiple risk factors for atherosclerotic disease.  Patient presents with [name] chronic progressively worsening right heel wound.  Patient also notes discomfort to the area which is also progressively worsening.  In the setting of chronic  nonhealing ulceration with multiple risk factors for atherosclerotic disease recommend the patient undergo [name] right lower extremity angiogram with possible intervention and attempt assess his anatomy and contributing degree of atherosclerotic disease.  If appropriate an attempt to revascularize leg can be made at that time.  Procedure, risk benefits were explained to the patient.  All questions were answered.  We will plan on the angiogram today with Dr. Delana Meyer.  2. ESRD: Currently maintained via [name] right groin PermCath on hemodialysis Seems to be functioning well Nephrology is following  3. Hyperlipidemia: Patient is on aspirin and statin in the outpatient setting. Encouraged good control as its slows the progression of atherosclerotic disease   4.  Diabetes: On appropriate medications Encouraged good control as its slows the progression of atherosclerotic disease   Discussed with Dr. Francene Castle, [name]  06/28/2021 12:52 PM  This note was created with Dragon medical transcription system.  Any error is purely unintentional.

## 2021-06-28 NOTE — Progress Notes (Signed)
Daughter,Heather Gilford Rile updated re patient status and POC. Dtr expresses thanks for update.

## 2021-06-28 NOTE — Progress Notes (Signed)
Hemodialysis:  Patient with asymptomatic hypotension during treatment,corrected with uf goal break and Albumin dose ordered by nephrology. Patient remained alert or easily arousable throughout treatment.Vancomycin also given last hour of treatment.

## 2021-06-28 NOTE — Progress Notes (Signed)
Turned UF back on due to BP is okay RN and doctor aware.

## 2021-06-28 NOTE — Progress Notes (Signed)
Ernest [name], Ernest [name], Ernest Pcp Per (Inactive)  [name] is from: SNF  DOA: 06/26/2021 LOS: 1  Chief complaints:  Chief Complaint  [name] presents with   Fever     Brief Narrative / Interim history: 75 year old M with PMH of ESRD on HD [name], Ernest [name], Ernest [name], Ernest [name], DVT, CVA, hep C, HTN, HLD, GERD and cognitive impairment brought to ED from EMS with fever, chills and worsening right heel decubitus ulcer, and admitted for severe sepsis in the setting infected right heel decubitus ulcer and right heel osteomyelitis.  Started on vancomycin, cefepime and Flagyl.  Nephrology, podiatry and vascular surgery consulted.  MRI right ankle without contrast showed large wound overlying the posterior aspect of the calcaneus with mild subcortical bone marrow edema in the posterior calcaneus most consistent with osteomyelitis, partial-thickness tear of the Achilles tendon along the medial calcaneal insertion and plantar fasciitis.  [name] to undergo lower extremity angiography today.  Subjective: Seen and examined earlier this afternoon after she returned from hemodialysis.  [name] has Ernest complaints but not a great historian.  He denies chest pain, dyspnea or GI symptoms.  He denies pain in his right heel.  Objective: Vitals:   06/28/21 1200 06/28/21 1215 06/28/21 1230 06/28/21 1540  BP: (!) 102/56 (!) 99/56 121/66 129/64  Pulse: 76 73 72 76  Resp: 19 17 (!) 25 20  Temp:    98.2 F (36.8 C)  TempSrc:    Oral  SpO2:    96%  Weight:    88.7 kg  Height:    '5\' 9"'$  (1.753 m)    Intake/Output Summary (Last 24 hours) at 06/28/2021 1558 Last data filed at 06/28/2021 1230 Gross per 24 hour  Intake --  Output 790 ml  Net -790 ml   Filed Weights   06/26/21 2311 06/28/21 0500 06/28/21 1540  Weight: 82 kg 88.7 kg 88.7 kg    Examination:  GENERAL: Ernest apparent distress.  Nontoxic. HEENT: MMM.  Vision and hearing grossly intact.  NECK: Supple.  Ernest  apparent JVD.  RESP: 98% on 3 L.  Ernest IWOB.  Fair aeration bilaterally. CVS:  RRR. Heart sounds normal.  ABD/GI/GU: BS+. Abd soft, NTND.  MSK/EXT:  Moves extremities. Ernest apparent deformity. Ernest edema.  Faint DP pulses. SKIN: Right heel ulceration with what looks like laceration wound over the medial aspect NEURO: Awake.  Oriented to self and "[facility]".  Follows commands.  Ernest apparent focal neuro deficit. PSYCH: Calm.  Ernest distress or agitation.  Procedures:  None  Microbiology summarized: [id number] and influenza PCR nonreactive. MRSA PCR screen negative. Blood cultures NGTD.  Assessment & Plan: Severe sepsis due to infected right heel ulcer and calcaneal osteomyelitis: POA.  Meets criteria with leukocytosis, tachypnea and lactic acidosis.  Blood cultures NGTD. -Continue broad-spectrum antibiotics with vancomycin, cefepime and Flagyl. -Plan for lower extremity angiography by vascular surgery today -Podiatry following as well  Peripheral vascular disease: -Management per vascular surgery as above  ESRD on HD [name]: Bone mineral disorder -Nephrology managing-on scheduled  Chronic combined [name]-Ernest cardiopulmonary symptoms.  TTE with LVEF of 50 to 55% (previously 45 to 50%), G2 DD.  Appears euvolemic on exam.  Had HD with ultrafiltration of 2.5 L.  Also started on IV Lasix 60 mg twice daily on admission -Fluid management with hemodialysis.  History of Ernest [name]/CABG: Stable. -Continue aspirin and atorvastatin  Controlled IDDM-2 with ESRD and neuropathy: A1c 6.2% in 2019. Recent Labs  Lab 06/27/21 2107 06/28/21 1533  GLUCAP 145* 140*  -Continue current insulin regimen -Recheck hemoglobin A1c in the morning  Paroxysmal atrial fibrillation: Rate controlled. -Continue home amiodarone.  History of CVA/vascular dementia: Awake.  Oriented to self and partial place -Continue home started on aspirin -Reorientation and delirium precautions.  History of seizures: Stable -Continue  Keppra  Depression -Continue home Zoloft and Remeron  Constipation -Bowel regimen  Goal of care counseling: [name] with multiple comorbidities as above.  Poor long-term prognosis.  Still full code.  -Palliative care consulted  Body mass index is 28.89 kg/m.       Pressure skin injury: POA Pressure Injury 06/26/21 Heel Right Unstageable - Full thickness tissue loss in which the base of the injury is covered by slough (yellow, tan, gray, green or brown) and/or eschar (tan, brown or black) in the wound bed. Pt has unstageable pressure injury (Active)  06/26/21 2319  Location: Heel  Location Orientation: Right  Staging: Unstageable - Full thickness tissue loss in which the base of the injury is covered by slough (yellow, tan, gray, green or brown) and/or eschar (tan, brown or black) in the wound bed.  Wound Description (Comments): Pt has unstageable pressure injury to R lateral heel, necrotic, eschar in base.  Present on Admission: Yes   DVT prophylaxis:  heparin injection 5,000 Units Start: 06/27/21 0600 SCDs Start: 06/27/21 0248  Code Status: Full code Family Communication: [name] and/or RN. Available if any question.  Level of care: Med-Surg Status is: Inpatient  Remains inpatient appropriate because:Ongoing diagnostic testing needed not appropriate for outpatient work up, IV treatments appropriate due to intensity of illness or inability to take PO, and Inpatient level of care appropriate due to severity of illness  Dispo: The [name] is from: SNF              Anticipated Ernest [name]/c is to: SNF              [name] currently is not medically stable to Ernest [name]/c.   Difficult to place [name] Ernest       Consultants:  Nephrology Podiatry Vascular surgery Palliative medicine   Sch Meds:  Scheduled Meds:  [MAR Hold] allopurinol  100 mg Oral Daily   [MAR Hold] amiodarone  200 mg Oral Daily   [MAR Hold] atorvastatin  40 mg Oral Daily   [MAR Hold] Chlorhexidine Gluconate Cloth  6  each Topical Q0600   [MAR Hold] docusate sodium  100 mg Oral Daily   [MAR Hold] furosemide  60 mg Intravenous Q12H   [MAR Hold] heparin injection (subcutaneous)  5,000 Units Subcutaneous Q8H   [MAR Hold] insulin detemir  17 Units Subcutaneous QHS   [MAR Hold] levETIRAcetam  500 mg Oral BID   [MAR Hold] levETIRAcetam  500 mg Oral Once per day on Mon Wed Fri   [facility] Hold] mirtazapine  7.5 mg Oral QHS   [MAR Hold] polyethylene glycol  17 g Oral Daily   [MAR Hold] senna-docusate  1 tablet Oral QHS   [MAR Hold] sertraline  25 mg Oral QHS   [MAR Hold] sevelamer carbonate  800 mg Oral TID WC   Continuous Infusions:  sodium chloride     ceFAZolin     [START ON 06/29/2021]  ceFAZolin (ANCEF) IV     [MAR Hold] ceFEPime (MAXIPIME) IV Stopped (06/27/21 1257)   [MAR Hold] metronidazole 500 mg (06/28/21 1432)   [MAR Hold] vancomycin 1,000 mg (06/28/21 1131)   PRN Meds:.[MAR Hold] acetaminophen **OR** [MAR Hold] acetaminophen, diphenhydrAMINE,  famotidine, [MAR Hold] feeding supplement (NEPRO CARB STEADY), [MAR Hold]  HYDROmorphone (DILAUDID) injection, methylPREDNISolone (SOLU-MEDROL) injection, midazolam, [MAR Hold]  morphine injection, [MAR Hold] ondansetron **OR** [MAR Hold] ondansetron (ZOFRAN) IV, [MAR Hold] ondansetron (ZOFRAN) IV, [MAR Hold] traZODone  Antimicrobials: Anti-infectives (From admission, onward)    Start     Dose/Rate Route Frequency Ordered Stop   06/29/21 0800  ceFAZolin (ANCEF) IVPB 1 g/50 mL premix       Note to Pharmacy: To be given in specials   1 g 100 mL/hr over 30 Minutes Intravenous  Once 06/28/21 1441     06/28/21 1513  ceFAZolin (ANCEF) 1-4 GM/50ML-% IVPB       Note to Pharmacy: Corlis Hove   : cabinet override      06/28/21 1513 06/29/21 0314   06/28/21 1200  [MAR Hold]  vancomycin (VANCOCIN) IVPB 1000 mg/200 mL premix        (MAR Hold since Wed 06/28/2021 at 1527.Hold Reason: Transfer to a Procedural area)   1,000 mg 200 mL/hr over 60 Minutes Intravenous Every  M-W-F (Hemodialysis) 06/27/21 0959 07/05/21 1159   06/27/21 1100  [MAR Hold]  metroNIDAZOLE (FLAGYL) IVPB 500 mg        (MAR Hold since Wed 06/28/2021 at 1527.Hold Reason: Transfer to a Procedural area)   500 mg 100 mL/hr over 60 Minutes Intravenous Every 12 hours 06/27/21 0250 07/04/21 1059   06/27/21 1100  vancomycin (VANCOREADY) IVPB 750 mg/150 mL        750 mg 150 mL/hr over 60 Minutes Intravenous  Once 06/27/21 1000 06/27/21 1137   06/27/21 1000  ceFEPIme (MAXIPIME) 1 g in sodium chloride 0.9 % 100 mL IVPB  Status:  Discontinued        1 g 200 mL/hr over 30 Minutes Intravenous  Once 06/27/21 0250 06/27/21 0316   06/27/21 1000  [MAR Hold]  ceFEPIme (MAXIPIME) 1 g in sodium chloride 0.9 % 100 mL IVPB        (MAR Hold since Wed 06/28/2021 at 1527.Hold Reason: Transfer to a Procedural area)   1 g 200 mL/hr over 30 Minutes Intravenous Every 24 hours 06/27/21 0316 07/04/21 0959   06/27/21 0336  vancomycin (VANCOCIN) IVPB 1000 mg/200 mL premix  Status:  Discontinued        1,000 mg 200 mL/hr over 60 Minutes Intravenous Every Dialysis 06/27/21 0337 06/27/21 1258   06/27/21 0330  vancomycin (VANCOREADY) IVPB 750 mg/150 mL  Status:  Discontinued        750 mg 150 mL/hr over 60 Minutes Intravenous  Once 06/27/21 0258 06/27/21 0959   06/27/21 0300  vancomycin (VANCOCIN) IVPB 1000 mg/200 mL premix  Status:  Discontinued        1,000 mg 200 mL/hr over 60 Minutes Intravenous  Once 06/27/21 0250 06/27/21 0258   06/27/21 0115  piperacillin-tazobactam (ZOSYN) IVPB 3.375 g        3.375 g 100 mL/hr over 30 Minutes Intravenous  Once 06/27/21 0112 06/27/21 0255   06/26/21 2330  cefTRIAXone (ROCEPHIN) 1 g in sodium chloride 0.9 % 100 mL IVPB  Status:  Discontinued        1 g 200 mL/hr over 30 Minutes Intravenous  Once 06/26/21 2321 06/27/21 0111   06/26/21 2330  vancomycin (VANCOCIN) IVPB 1000 mg/200 mL premix        1,000 mg 200 mL/hr over 60 Minutes Intravenous  Once 06/26/21 2321 06/27/21 0210         I have personally reviewed the following  labs and images: CBC: Recent Labs  Lab 06/27/21 0042 06/27/21 0611  WBC 19.6* 19.6*  NEUTROABS 18.6*  --   HGB 9.1* 9.1*  HCT 28.3* 29.1*  MCV 89.3 87.9  PLT 272 267   BMP &GFR Recent Labs  Lab 06/27/21 0042 06/27/21 0611 06/28/21 0311  NA 135 136 137  K 5.3* 5.1 4.9  CL 93* 94* 97*  CO2 '28 30 25  '$ GLUCOSE 330* 296* 91  BUN 62* 73* 84*  CREATININE 7.02* 7.31* 8.67*  CALCIUM 8.1* 8.1* 8.3*   Estimated Creatinine Clearance: 8.5 mL/min (A) (by C-G formula based on SCr of 8.67 mg/dL (H)). Liver & Pancreas: Recent Labs  Lab 06/27/21 0042  AST 16  ALT 14  ALKPHOS 110  BILITOT 0.6  PROT 6.6  ALBUMIN 2.4*   Ernest results for input(s): LIPASE, AMYLASE in the last 168 hours. Ernest results for input(s): AMMONIA in the last 168 hours. Diabetic: Ernest results for input(s): HGBA1C in the last 72 hours. Recent Labs  Lab 06/27/21 2107 06/28/21 1533  GLUCAP 145* 140*   Cardiac Enzymes: Ernest results for input(s): CKTOTAL, CKMB, CKMBINDEX, TROPONINI in the last 168 hours. Ernest results for input(s): PROBNP in the last 8760 hours. Coagulation Profile: Recent Labs  Lab 06/27/21 0226 06/27/21 0611  INR 1.4* 1.4*   Thyroid Function Tests: Ernest results for input(s): TSH, T4TOTAL, FREET4, T3FREE, THYROIDAB in the last 72 hours. Lipid Profile: Ernest results for input(s): CHOL, HDL, LDLCALC, TRIG, CHOLHDL, LDLDIRECT in the last 72 hours. Anemia Panel: Ernest results for input(s): VITAMINB12, FOLATE, FERRITIN, TIBC, IRON, RETICCTPCT in the last 72 hours. Urine analysis:    Component Value Date/Time   COLORURINE YELLOW 04/29/2015 1702   APPEARANCEUR CLEAR 04/29/2015 1702   LABSPEC 1.016 04/29/2015 1702   PHURINE 7.5 04/29/2015 1702   GLUCOSEU >1000 (A) 04/29/2015 1702   HGBUR SMALL (A) 04/29/2015 1702   BILIRUBINUR NEGATIVE 04/29/2015 1702   KETONESUR NEGATIVE 04/29/2015 1702   PROTEINUR >300 (A) 04/29/2015 1702   UROBILINOGEN 0.2 04/29/2015  1702   NITRITE NEGATIVE 04/29/2015 1702   LEUKOCYTESUR NEGATIVE 04/29/2015 1702   Sepsis Labs: Invalid input(s): PROCALCITONIN, [place]  Microbiology: Recent Results (from the past 240 hour(s))  Blood culture (routine x 2)     Status: None (Preliminary result)   Collection Time: 06/27/21 12:42 AM   Specimen: BLOOD  Result Value Ref Range Status   Specimen Description BLOOD RIGHT ASSIST CONTROL  Final   Special Requests   Final    BOTTLES DRAWN AEROBIC AND ANAEROBIC Blood Culture adequate volume   Culture   Final    Ernest GROWTH 1 DAY Performed at [facility], [address], [place], [place] [address]    Report Status PENDING  Incomplete  Resp Panel by RT-PCR (Flu A&B, Covid) Nasopharyngeal Swab     Status: None   Collection Time: 06/27/21 12:42 AM   Specimen: Nasopharyngeal Swab; Nasopharyngeal(NP) swabs in vial transport medium  Result Value Ref Range Status   SARS Coronavirus 2 by RT PCR NEGATIVE NEGATIVE Final    Comment: (NOTE) SARS-CoV-2 target nucleic acids are NOT DETECTED.  The SARS-CoV-2 RNA is generally detectable in upper respiratory specimens during the acute phase of infection. The lowest concentration of SARS-CoV-2 viral copies this assay can detect is 138 copies/mL. A negative result does not preclude SARS-Cov-2 infection and should not be used as the sole basis for treatment or other [name] management decisions. A negative result may occur with  improper specimen collection/handling, submission  of specimen other than nasopharyngeal swab, presence of viral mutation(s) within the areas targeted by this assay, and inadequate number of viral copies(<138 copies/mL). A negative result must be combined with clinical observations, [name] history, and epidemiological information. The expected result is Negative.  Fact Sheet for Patients:  EntrepreneurPulse.com.au  Fact Sheet for Healthcare Providers:   IncredibleEmployment.be  This test is Ernest t yet approved or cleared by the [place] FDA and  has been authorized for detection and/or diagnosis of SARS-CoV-2 by FDA under an Emergency Use Authorization (EUA). This EUA will remain  in effect (meaning this test can be used) for the duration of the COVID-19 declaration under Section 564(b)(1) of the Act, 21 U.S.C.section 360bbb-3(b)(1), unless the authorization is terminated  or revoked sooner.       Influenza A by PCR NEGATIVE NEGATIVE Final   Influenza B by PCR NEGATIVE NEGATIVE Final    Comment: (NOTE) The Xpert Xpress SARS-CoV-2/FLU/RSV plus assay is intended as an aid in the diagnosis of influenza from Nasopharyngeal swab specimens and should not be used as a sole basis for treatment. Nasal washings and aspirates are unacceptable for Xpert Xpress SARS-CoV-2/FLU/RSV testing.  Fact Sheet for Patients: EntrepreneurPulse.com.au  Fact Sheet for Healthcare Providers: IncredibleEmployment.be  This test is not yet approved or cleared by the [place] FDA and has been authorized for detection and/or diagnosis of SARS-CoV-2 by FDA under an Emergency Use Authorization (EUA). This EUA will remain in effect (meaning this test can be used) for the duration of the COVID-19 declaration under Section 564(b)(1) of the Act, 21 U.S.C. section 360bbb-3(b)(1), unless the authorization is terminated or revoked.  Performed at [facility], [address] [place], [place] [address]   Blood culture (routine x 2)     Status: None (Preliminary result)   Collection Time: 06/27/21 12:43 AM   Specimen: BLOOD  Result Value Ref Range Status   Specimen Description BLOOD RIGHT FOREARM  Final   Special Requests   Final    BOTTLES DRAWN AEROBIC AND ANAEROBIC Blood Culture adequate volume   Culture   Final    Ernest GROWTH 1 DAY Performed at [facility], [address],  [place], [place] [address]    Report Status PENDING  Incomplete  MRSA Next Gen by PCR, Nasal     Status: None   Collection Time: 06/28/21  5:31 AM   Specimen: Nasal Mucosa; Nasal Swab  Result Value Ref Range Status   MRSA by PCR Next Gen NOT DETECTED NOT DETECTED Final    Comment: (NOTE) The GeneXpert MRSA Assay (FDA approved for NASAL specimens only), is one component of a comprehensive MRSA colonization surveillance program. It is not intended to diagnose MRSA infection nor to guide or monitor treatment for MRSA infections. Test performance is not FDA approved in patients less than 70 years old. Performed at [facility], [address] [place], [place] [address]     Radiology Studies: MR ANKLE RIGHT WO CONTRAST  Result Date: 06/28/2021 CLINICAL DATA:  Large ulcer along the right heel with worsening pain. EXAM: MRI OF THE RIGHT ANKLE WITHOUT CONTRAST TECHNIQUE: Multiplanar, multisequence MR imaging of the ankle was performed. Ernest intravenous contrast was administered. COMPARISON:  None. FINDINGS: TENDONS Peroneal: Peroneal longus tendon intact. Peroneal brevis intact. Posteromedial: Posterior tibial tendon intact. Flexor hallucis longus tendon intact. Flexor digitorum longus tendon intact. Anterior: Tibialis anterior tendon intact. Extensor hallucis longus tendon intact Extensor digitorum longus tendon intact. Achilles: Partial-thickness tear of the Achilles  tendon along the medial calcaneal insertion. Plantar Fascia: Thickening of the medial band of the plantar fascia with increased signal consistent with plantar fasciitis. LIGAMENTS Lateral: Anterior talofibular ligament intact. Calcaneofibular ligament intact. Posterior talofibular ligament intact. Anterior and posterior tibiofibular ligaments intact. Medial: Deltoid ligament intact. Spring ligament intact. CARTILAGE Ankle Joint: Ernest joint effusion. Normal ankle mortise. Ernest chondral defect. Subtalar Joints/Sinus Tarsi: Normal subtalar  joints. Ernest subtalar joint effusion. Normal sinus tarsi. Bones and Soft Tissue: Large wound overlying the posterior aspect of the calcaneus. Mild subcortical bone marrow edema in the posterior calcaneus most consistent with osteomyelitis. Ernest acute fracture or dislocation. Incidental note made of an os naviculare. T2 hyperintense signal throughout the plantar musculature likely neurogenic. IMPRESSION: 1. Large wound overlying the posterior aspect of the calcaneus. Mild subcortical bone marrow edema in the posterior calcaneus most consistent with osteomyelitis. 2. Partial-thickness tear of the Achilles tendon along the medial calcaneal insertion. 3. Thickening of the medial band of the plantar fascia with increased signal consistent with plantar fasciitis. Electronically Signed   By: Kathreen Devoid M.Ernest [name].   On: 06/28/2021 06:59       Mavric Cortright T. [place]  If 7PM-7AM, please contact night-coverage [email or web address] 06/28/2021, 3:58 PM

## 2021-06-29 ENCOUNTER — Encounter: Payer: Self-pay | Admitting: Vascular Surgery

## 2021-06-29 ENCOUNTER — Other Ambulatory Visit (INDEPENDENT_AMBULATORY_CARE_PROVIDER_SITE_OTHER): Payer: Self-pay | Admitting: Vascular Surgery

## 2021-06-29 ENCOUNTER — Inpatient Hospital Stay: Payer: Self-pay | Admitting: Anesthesiology

## 2021-06-29 ENCOUNTER — Encounter: Payer: Self-pay | Admitting: Anesthesiology

## 2021-06-29 DIAGNOSIS — F0150 Vascular dementia without behavioral disturbance: Secondary | ICD-10-CM

## 2021-06-29 DIAGNOSIS — I739 Peripheral vascular disease, unspecified: Secondary | ICD-10-CM | POA: Diagnosis not present

## 2021-06-29 DIAGNOSIS — Z7189 Other specified counseling: Secondary | ICD-10-CM | POA: Diagnosis not present

## 2021-06-29 DIAGNOSIS — N186 End stage renal disease: Secondary | ICD-10-CM | POA: Diagnosis not present

## 2021-06-29 DIAGNOSIS — Z515 Encounter for palliative care: Secondary | ICD-10-CM | POA: Diagnosis not present

## 2021-06-29 DIAGNOSIS — I5042 Chronic combined systolic (congestive) and diastolic (congestive) heart failure: Secondary | ICD-10-CM | POA: Diagnosis not present

## 2021-06-29 DIAGNOSIS — E109 Type 1 diabetes mellitus without complications: Secondary | ICD-10-CM | POA: Diagnosis not present

## 2021-06-29 DIAGNOSIS — M869 Osteomyelitis, unspecified: Secondary | ICD-10-CM | POA: Diagnosis not present

## 2021-06-29 DIAGNOSIS — Z992 Dependence on renal dialysis: Secondary | ICD-10-CM

## 2021-06-29 DIAGNOSIS — I70234 Atherosclerosis of native arteries of right leg with ulceration of heel and midfoot: Secondary | ICD-10-CM

## 2021-06-29 DIAGNOSIS — L97419 Non-pressure chronic ulcer of right heel and midfoot with unspecified severity: Secondary | ICD-10-CM

## 2021-06-29 LAB — RENAL FUNCTION PANEL
Albumin: 2.4 g/dL — ABNORMAL LOW (ref 3.5–5.0)
Anion gap: 13 (ref 5–15)
BUN: 53 mg/dL — ABNORMAL HIGH (ref 8–23)
CO2: 27 mmol/L (ref 22–32)
Calcium: 8.5 mg/dL — ABNORMAL LOW (ref 8.9–10.3)
Chloride: 95 mmol/L — ABNORMAL LOW (ref 98–111)
Creatinine, Ser: 5.91 mg/dL — ABNORMAL HIGH (ref 0.61–1.24)
GFR, Estimated: 9 mL/min — ABNORMAL LOW (ref >60)
Glucose, Bld: 159 mg/dL — ABNORMAL HIGH (ref 70–99)
Phosphorus: 4.9 mg/dL — ABNORMAL HIGH (ref 2.5–4.6)
Potassium: 4.4 mmol/L (ref 3.5–5.1)
Sodium: 135 mmol/L (ref 135–145)

## 2021-06-29 LAB — CBC
HCT: 25.4 % — ABNORMAL LOW (ref 39.0–52.0)
Hemoglobin: 8.5 g/dL — ABNORMAL LOW (ref 13.0–17.0)
MCH: 29.1 pg (ref 26.0–34.0)
MCHC: 33.5 g/dL (ref 30.0–36.0)
MCV: 87 fL (ref 80.0–100.0)
Platelets: 251 10*3/uL (ref 150–400)
RBC: 2.92 MIL/uL — ABNORMAL LOW (ref 4.22–5.81)
RDW: 15.3 % (ref 11.5–15.5)
WBC: 14.1 10*3/uL — ABNORMAL HIGH (ref 4.0–10.5)
nRBC: 0 % (ref 0.0–0.2)

## 2021-06-29 LAB — GLUCOSE, CAPILLARY: Glucose-Capillary: 176 mg/dL — ABNORMAL HIGH (ref 70–99)

## 2021-06-29 LAB — MAGNESIUM: Magnesium: 2 mg/dL (ref 1.7–2.4)

## 2021-06-29 MED ORDER — EPOETIN ALFA 10000 UNIT/ML IJ SOLN
10000.0000 [IU] | INTRAMUSCULAR | Status: DC
  Administered 2021-06-30 – 2021-07-19 (×7): 10000 [IU] via INTRAVENOUS
  Filled 2021-06-29 (×4): qty 1

## 2021-06-29 MED ORDER — ACETAMINOPHEN 500 MG PO TABS
1000.0000 mg | ORAL_TABLET | Freq: Three times a day (TID) | ORAL | Status: DC
  Administered 2021-06-29 – 2021-07-20 (×57): 1000 mg via ORAL
  Filled 2021-06-29 (×61): qty 2

## 2021-06-29 NOTE — Progress Notes (Signed)
MD notified of trending vital signs.  Patient denies any issues and appears resting comfortably.

## 2021-06-29 NOTE — Progress Notes (Signed)
Ernest [name] [facility] Daily Progress Note  06/28/21:             1.  Introduction catheter into right lower extremity 3rd order catheter placement              2.    Contrast injection right lower extremity for distal runoff             3.  Percutaneous transluminal angioplasty right common femoral artery.              4.  Star close closure left common femoral arteriotomy  Subjective: Patient sleeping comfortably in bed this AM.  No acute issues overnight.  Objective: Vitals:   06/28/21 1849 06/29/21 0431 06/29/21 0500 06/29/21 0815  BP: 134/69 122/79  (!) 100/56  Pulse: 84 76  81  Resp: 17 16    Temp: 98.8 F (37.1 C) 98.7 F (37.1 C)  99.1 F (37.3 C)  TempSrc: Oral Oral  Oral  SpO2:  99%  98%  Weight:   92.8 kg   Height:        Intake/Output Summary (Last 24 hours) at 06/29/2021 1252 Last data filed at 06/29/2021 1136 Gross per 24 hour  Intake 831.58 ml  Output --  Net 831.58 ml   Physical Exam: A&Ox3, NAD CV: RRR Pulmonary: CTA Bilaterally Abdomen: Soft, Non-tender, Non-distended Left groin:  Access site: Clean dry and intact. Vascular:  Necrotic heel ulceration: Stable.  Unable to palpate pedal pulses.   Laboratory: CBC    Component Value Date/Time   WBC 14.1 (H) 06/29/2021 0536   HGB 8.5 (L) 06/29/2021 0536   HCT 25.4 (L) 06/29/2021 0536   PLT 251 06/29/2021 0536   BMET    Component Value Date/Time   NA 135 06/29/2021 0536   NA 140 12/11/2017 0000   K 4.4 06/29/2021 0536   CL 95 (L) 06/29/2021 0536   CO2 27 06/29/2021 0536   GLUCOSE 159 (H) 06/29/2021 0536   BUN 53 (H) 06/29/2021 0536   BUN 16 12/11/2017 0000   CREATININE 5.91 (H) 06/29/2021 0536   CALCIUM 8.5 (L) 06/29/2021 0536   CALCIUM 8.4 (L) 10/05/2017 0615   GFRNONAA 9 (L) 06/29/2021 0536   GFRAA 21 (L) 12/04/2019 2027   Assessment/Planning: The patient is a 75 year old male who presented from his nursing residence with progressively worsening on healing heel ulceration in  the setting of significant peripheral artery disease, s/p right lower extremity angiogram with intervention - POD#1  1) Chronic heel ulceration: Has been seen and examined by podiatry.  Based on his physical exam, imaging the team his right foot unsalvageable due to the extent of the wound / severe atherosclerotic disease.  At this time, they are recommending below the knee amputation.  I have been able to place the patient on tomorrow's operating room schedule.  I reached out to his daughter [name] and left messages on the cell phone and nobody picked up her home phone.  I have reached out to the team asking if anybody sees the daughter to please let me know and attempt to gain consent as patient has a history of dementia and cannot consent on his own.  2) End-stage renal disease Being followed by nephrology. Last dialysis treatment yesterday Next session Friday afternoon  3) vascular dementia Patient's daughter is his power of attorney I have left a message on her cell phone as well as called to house phone to try to obtain consent for below the  knee amputation tomorrow Will continue to try to reach out to her  Discussed with Dr. Eber Hong [facility] [name] 06/29/2021 12:52 PM

## 2021-06-29 NOTE — Progress Notes (Signed)
PROGRESS NOTE  Ernest [name] [id number] DOB: 1945/12/30   PCP: Patient, [name] (Inactive)  Patient is from: SNF  DOA: 06/26/2021 LOS: 2  Chief complaints:  Chief Complaint  Patient presents with   Fever     Brief Narrative / Interim history: 75 year old M with PMH of ESRD on HD MWF, systolic CHF, COPD, CAD, DVT, CVA, hep C, HTN, HLD, GERD and cognitive impairment brought to ED from EMS with fever, chills and worsening right heel decubitus ulcer, and admitted for severe sepsis in the setting infected right heel decubitus ulcer and right heel osteomyelitis.  Started on vancomycin, cefepime and Flagyl.  Nephrology, podiatry and vascular surgery consulted.  MRI right ankle without contrast showed large wound overlying the posterior aspect of the calcaneus with mild subcortical bone marrow edema in the posterior calcaneus most consistent with osteomyelitis, partial-thickness tear of the Achilles tendon along the medial calcaneal insertion and plantar fasciitis.  Patient angiography that showed 70% right common femoral artery stenosis.  Podiatry is recommending BKA, which seems to be planned for 9/16 by vascular surgery..   Subjective: Seen and examined earlier this morning.  No major events overnight of this morning.  No complaints.  He denies chest pain, shortness of breath or GI symptoms.  He rates his foot pain as "little".  Not a great historian either.  Objective: Vitals:   06/28/21 1849 06/29/21 0431 06/29/21 0500 06/29/21 0815  BP: 134/69 122/79  (!) 100/56  Pulse: 84 76  81  Resp: 17 16    Temp: 98.8 F (37.1 C) 98.7 F (37.1 C)  99.1 F (37.3 C)  TempSrc: Oral Oral  Oral  SpO2:  99%  98%  Weight:   92.8 kg   Height:        Intake/Output Summary (Last 24 hours) at 06/29/2021 1235 Last data filed at 06/29/2021 1136 Gross per 24 hour  Intake 831.58 ml  Output --  Net 831.58 ml   Filed Weights   06/28/21 0500 06/28/21 1540 06/29/21 0500  Weight: 88.7 kg 88.7 kg  92.8 kg    Examination:  GENERAL: No apparent distress.  Nontoxic. HEENT: MMM.  Vision and hearing grossly intact.  NECK: Supple.  No apparent JVD.  RESP: 99% on 3 L.  No IWOB.  Fair aeration bilaterally. CVS:  RRR. Heart sounds normal.  ABD/GI/GU: BS+. Abd soft, NTND.  MSK/EXT:  Moves extremities.  No apparent deformity.  No edema.  Faint DP pulses. SKIN: Right heel ulceration with what looks like old laceration wound over the medial aspect NEURO: Awake.  Oriented to self, ""hospital", city and state.  No insight into why he is in the hospital.  No apparent focal neuro deficit. PSYCH: Calm.  No distress or agitation.  Procedures:  9/14-lower extremity catheterization with 70% stenosis of right common femoral artery  Microbiology summarized: COVID-19 and influenza PCR nonreactive. MRSA PCR screen negative. Blood cultures NGTD.  Assessment & Plan: Severe sepsis due to infected right heel ulcer and calcaneal osteomyelitis: POA.  Meets criteria with leukocytosis, tachypnea and lactic acidosis.  Blood cultures NGTD.  LV angiography with significant PAD as above. -Continue broad-spectrum antibiotics with vancomycin, cefepime and Flagyl. -Plan for right BKA by vascular surgery on 9/16  Peripheral vascular disease: Lower extremity cauterization as above. -Management per vascular surgery as above  ESRD on HD MWF: Bone mineral disorder -Nephrology managing-on scheduled  Chronic combined CHF-no cardiopulmonary symptoms.  TTE with LVEF of 50 to 55% (previously 45 to 50%), G2  DD.  Appears euvolemic on exam.  -Fluid management with hemodialysis. -Also on IV Lasix.  History of CAD/CABG: Stable. -Continue aspirin and atorvastatin  Controlled IDDM-2 with ESRD and neuropathy: A1c 6.2% in 2019. Recent Labs  Lab 06/27/21 2107 06/28/21 1533 06/28/21 1745  GLUCAP 145* 140* 144*  -Continue current insulin regimen -Recheck hemoglobin A1c in the morning  Paroxysmal atrial fibrillation:  Rate controlled. -Continue home amiodarone.  History of CVA/vascular dementia: Awake.  Oriented to self and partial place but limited insight.  He thinks he is in the hospital because his roommates tested positive for COVID.  -Continue home started on aspirin -Reorientation and delirium precautions.  History of seizures: Stable -Continue Keppra  Depression -Continue home Zoloft and Remeron  Constipation -Bowel regimen  Goal of care counseling: multiple comorbidities as above.  Poor long-term prognosis.  Still full code.  I do think patient comprehends such complaints discussion to make decision.  I could not reach patient's daughter and son to discuss goals of care.   -Palliative care consulted  Body mass index is 30.21 kg/m.       Pressure skin injury: POA Pressure Injury 06/26/21 Heel Right Unstageable - Full thickness tissue loss in which the base of the injury is covered by slough (yellow, tan, gray, green or brown) and/or eschar (tan, brown or black) in the wound bed. Pt has unstageable pressure injury (Active)  06/26/21 2319  Location: Heel  Location Orientation: Right  Staging: Unstageable - Full thickness tissue loss in which the base of the injury is covered by slough (yellow, tan, gray, green or brown) and/or eschar (tan, brown or black) in the wound bed.  Wound Description (Comments): Pt has unstageable pressure injury to R lateral heel, necrotic, eschar in base.  Present on Admission: Yes   DVT prophylaxis:  heparin injection 5,000 Units Start: 06/27/21 0600 SCDs Start: 06/27/21 0248  Code Status: Full code Family Communication: Not able to reach his daughters and son using phone numbers listed  Level of care: Med-Surg Status is: Inpatient  Remains inpatient appropriate because:Ongoing diagnostic testing needed not appropriate for outpatient work up, IV treatments appropriate due to intensity of illness or inability to take PO, and Inpatient level of care  appropriate due to severity of illness  Dispo: The patient is from: SNF              Anticipated d/c is to: SNF              Patient currently is not medically stable to d/c.   Difficult to place patient No       Consultants:  Nephrology Podiatry Vascular surgery Palliative medicine   Sch Meds:  Scheduled Meds:  allopurinol  100 mg Oral Daily   amiodarone  200 mg Oral Daily   aspirin EC  81 mg Oral Daily   atorvastatin  40 mg Oral Daily   Chlorhexidine Gluconate Cloth  6 each Topical Q0600   clopidogrel  75 mg Oral Q breakfast   docusate sodium  100 mg Oral Daily   [START ON 06/30/2021] epoetin (EPOGEN/PROCRIT) injection  10,000 Units Intravenous Q M,W,F-HD   furosemide  60 mg Intravenous Q12H   heparin injection (subcutaneous)  5,000 Units Subcutaneous Q8H   insulin detemir  17 Units Subcutaneous QHS   levETIRAcetam  500 mg Oral BID   levETIRAcetam  500 mg Oral Once per day on Mon Wed Fri   mirtazapine  7.5 mg Oral QHS   polyethylene glycol  17 g  Oral Daily   senna-docusate  1 tablet Oral QHS   sertraline  25 mg Oral QHS   sevelamer carbonate  800 mg Oral TID WC   sodium chloride flush  3 mL Intravenous Q12H   Continuous Infusions:  sodium chloride     ceFEPime (MAXIPIME) IV Stopped (06/29/21 0954)   metronidazole Stopped (06/29/21 1126)   vancomycin 1,000 mg (06/28/21 1131)   PRN Meds:.sodium chloride, acetaminophen **OR** acetaminophen, feeding supplement (NEPRO CARB STEADY), HYDROmorphone (DILAUDID) injection, morphine injection, morphine injection, ondansetron **OR** ondansetron (ZOFRAN) IV, ondansetron (ZOFRAN) IV, ondansetron (ZOFRAN) IV, oxyCODONE, sodium chloride flush, traZODone  Antimicrobials: Anti-infectives (From admission, onward)    Start     Dose/Rate Route Frequency Ordered Stop   06/29/21 0800  ceFAZolin (ANCEF) IVPB 1 g/50 mL premix  Status:  Discontinued       Note to Pharmacy: To be given in specials   1 g 100 mL/hr over 30 Minutes  Intravenous  Once 06/28/21 1441 06/28/21 1651   06/28/21 1513  ceFAZolin (ANCEF) 1-4 GM/50ML-% IVPB       Note to Pharmacy: Corlis Hove   : cabinet override      06/28/21 1513 06/29/21 0314   06/28/21 1200  vancomycin (VANCOCIN) IVPB 1000 mg/200 mL premix        1,000 mg 200 mL/hr over 60 Minutes Intravenous Every M-W-F (Hemodialysis) 06/27/21 0959 07/05/21 1159   06/27/21 1100  metroNIDAZOLE (FLAGYL) IVPB 500 mg        500 mg 100 mL/hr over 60 Minutes Intravenous Every 12 hours 06/27/21 0250 07/04/21 1059   06/27/21 1100  vancomycin (VANCOREADY) IVPB 750 mg/150 mL        750 mg 150 mL/hr over 60 Minutes Intravenous  Once 06/27/21 1000 06/27/21 1137   06/27/21 1000  ceFEPIme (MAXIPIME) 1 g in sodium chloride 0.9 % 100 mL IVPB  Status:  Discontinued        1 g 200 mL/hr over 30 Minutes Intravenous  Once 06/27/21 0250 06/27/21 0316   06/27/21 1000  ceFEPIme (MAXIPIME) 1 g in sodium chloride 0.9 % 100 mL IVPB        1 g 200 mL/hr over 30 Minutes Intravenous Every 24 hours 06/27/21 0316 07/04/21 0959   06/27/21 0336  vancomycin (VANCOCIN) IVPB 1000 mg/200 mL premix  Status:  Discontinued        1,000 mg 200 mL/hr over 60 Minutes Intravenous Every Dialysis 06/27/21 0337 06/27/21 1258   06/27/21 0330  vancomycin (VANCOREADY) IVPB 750 mg/150 mL  Status:  Discontinued        750 mg 150 mL/hr over 60 Minutes Intravenous  Once 06/27/21 0258 06/27/21 0959   06/27/21 0300  vancomycin (VANCOCIN) IVPB 1000 mg/200 mL premix  Status:  Discontinued        1,000 mg 200 mL/hr over 60 Minutes Intravenous  Once 06/27/21 0250 06/27/21 0258   06/27/21 0115  piperacillin-tazobactam (ZOSYN) IVPB 3.375 g        3.375 g 100 mL/hr over 30 Minutes Intravenous  Once 06/27/21 0112 06/27/21 0255   06/26/21 2330  cefTRIAXone (ROCEPHIN) 1 g in sodium chloride 0.9 % 100 mL IVPB  Status:  Discontinued        1 g 200 mL/hr over 30 Minutes Intravenous  Once 06/26/21 2321 06/27/21 0111   06/26/21 2330  vancomycin  (VANCOCIN) IVPB 1000 mg/200 mL premix        1,000 mg 200 mL/hr over 60 Minutes Intravenous  Once 06/26/21 2321 06/27/21  0210        I have personally reviewed the following labs and images: CBC: Recent Labs  Lab 06/27/21 0042 06/27/21 0611 06/29/21 0536  WBC 19.6* 19.6* 14.1*  NEUTROABS 18.6*  --   --   HGB 9.1* 9.1* 8.5*  HCT 28.3* 29.1* 25.4*  MCV 89.3 87.9 87.0  PLT 272 267 251   BMP &GFR Recent Labs  Lab 06/27/21 0042 06/27/21 0611 06/28/21 0311 06/29/21 0536  NA 135 136 137 135  K 5.3* 5.1 4.9 4.4  CL 93* 94* 97* 95*  CO2 '28 30 25 27  '$ GLUCOSE 330* 296* 91 159*  BUN 62* 73* 84* 53*  CREATININE 7.02* 7.31* 8.67* 5.91*  CALCIUM 8.1* 8.1* 8.3* 8.5*  MG  --   --   --  2.0  PHOS  --   --   --  4.9*   Estimated Creatinine Clearance: 12.7 mL/min (A) (by C-G formula based on SCr of 5.91 mg/dL (H)). Liver & Pancreas: Recent Labs  Lab 06/27/21 0042 06/29/21 0536  AST 16  --   ALT 14  --   ALKPHOS 110  --   BILITOT 0.6  --   PROT 6.6  --   ALBUMIN 2.4* 2.4*   No results for input(s): LIPASE, AMYLASE in the last 168 hours. No results for input(s): AMMONIA in the last 168 hours. Diabetic: No results for input(s): HGBA1C in the last 72 hours. Recent Labs  Lab 06/27/21 2107 06/28/21 1533 06/28/21 1745  GLUCAP 145* 140* 144*   Cardiac Enzymes: No results for input(s): CKTOTAL, CKMB, CKMBINDEX, TROPONINI in the last 168 hours. No results for input(s): PROBNP in the last 8760 hours. Coagulation Profile: Recent Labs  Lab 06/27/21 0226 06/27/21 0611  INR 1.4* 1.4*   Thyroid Function Tests: No results for input(s): TSH, T4TOTAL, FREET4, T3FREE, THYROIDAB in the last 72 hours. Lipid Profile: No results for input(s): CHOL, HDL, LDLCALC, TRIG, CHOLHDL, LDLDIRECT in the last 72 hours. Anemia Panel: No results for input(s): VITAMINB12, FOLATE, FERRITIN, TIBC, IRON, RETICCTPCT in the last 72 hours. Urine analysis:    Component Value Date/Time   COLORURINE  YELLOW 04/29/2015 1702   APPEARANCEUR CLEAR 04/29/2015 1702   LABSPEC 1.016 04/29/2015 1702   PHURINE 7.5 04/29/2015 1702   GLUCOSEU >1000 (A) 04/29/2015 1702   HGBUR SMALL (A) 04/29/2015 1702   BILIRUBINUR NEGATIVE 04/29/2015 1702   KETONESUR NEGATIVE 04/29/2015 1702   PROTEINUR >300 (A) 04/29/2015 1702   UROBILINOGEN 0.2 04/29/2015 1702   NITRITE NEGATIVE 04/29/2015 1702   LEUKOCYTESUR NEGATIVE 04/29/2015 1702   Sepsis Labs: Invalid input(s): PROCALCITONIN, [place]  Microbiology: Recent Results (from the past 240 hour(s))  Blood culture (routine x 2)     Status: None (Preliminary result)   Collection Time: 06/27/21 12:42 AM   Specimen: BLOOD  Result Value Ref Range Status   Specimen Description BLOOD RIGHT ASSIST CONTROL  Final   Special Requests   Final    BOTTLES DRAWN AEROBIC AND ANAEROBIC Blood Culture adequate volume   Culture   Final    NO GROWTH 2 DAYS Performed at [facility], [address] [place], [place] [address]    Report Status PENDING  Incomplete  Resp Panel by RT-PCR (Flu A&B, Covid) Nasopharyngeal Swab     Status: None   Collection Time: 06/27/21 12:42 AM   Specimen: Nasopharyngeal Swab; Nasopharyngeal(NP) swabs in vial transport medium  Result Value Ref Range Status   SARS Coronavirus 2 by RT PCR NEGATIVE NEGATIVE  Final    Comment: (NOTE) SARS-CoV-2 target nucleic acids are NOT DETECTED.  The SARS-CoV-2 RNA is generally detectable in upper respiratory specimens during the acute phase of infection. The lowest concentration of SARS-CoV-2 viral copies this assay can detect is 138 copies/mL. A negative result does not preclude SARS-Cov-2 infection and should not be used as the sole basis for treatment or other patient management decisions. A negative result may occur with  improper specimen collection/handling, submission of specimen other than nasopharyngeal swab, presence of viral mutation(s) within the areas targeted by this assay, and  inadequate number of viral copies(<138 copies/mL). A negative result must be combined with clinical observations, patient history, and epidemiological information. The expected result is Negative.  Fact Sheet for Patients:  EntrepreneurPulse.com.au  Fact Sheet for Healthcare Providers:  IncredibleEmployment.be  This test is no t yet approved or cleared by the [place] FDA and  has been authorized for detection and/or diagnosis of SARS-CoV-2 by FDA under an Emergency Use Authorization (EUA). This EUA will remain  in effect (meaning this test can be used) for the duration of the COVID-19 declaration under Section 564(b)(1) of the Act, 21 U.S.C.section 360bbb-3(b)(1), unless the authorization is terminated  or revoked sooner.       Influenza A by PCR NEGATIVE NEGATIVE Final   Influenza B by PCR NEGATIVE NEGATIVE Final    Comment: (NOTE) The Xpert Xpress SARS-CoV-2/FLU/RSV plus assay is intended as an aid in the diagnosis of influenza from Nasopharyngeal swab specimens and should not be used as a sole basis for treatment. Nasal washings and aspirates are unacceptable for Xpert Xpress SARS-CoV-2/FLU/RSV testing.  Fact Sheet for Patients: EntrepreneurPulse.com.au  Fact Sheet for Healthcare Providers: IncredibleEmployment.be  This test is not yet approved or cleared by the [place] FDA and has been authorized for detection and/or diagnosis of SARS-CoV-2 by FDA under an Emergency Use Authorization (EUA). This EUA will remain in effect (meaning this test can be used) for the duration of the COVID-19 declaration under Section 564(b)(1) of the Act, 21 U.S.C. section 360bbb-3(b)(1), unless the authorization is terminated or revoked.  Performed at [facility], [address] [place], [place] [address]   Blood culture (routine x 2)     Status: None (Preliminary result)   Collection Time:  06/27/21 12:43 AM   Specimen: BLOOD  Result Value Ref Range Status   Specimen Description BLOOD RIGHT FOREARM  Final   Special Requests   Final    BOTTLES DRAWN AEROBIC AND ANAEROBIC Blood Culture adequate volume   Culture   Final    NO GROWTH 2 DAYS Performed at [facility], [address] [place], [place] [address]    Report Status PENDING  Incomplete  MRSA Next Gen by PCR, Nasal     Status: None   Collection Time: 06/28/21  5:31 AM   Specimen: Nasal Mucosa; Nasal Swab  Result Value Ref Range Status   MRSA by PCR Next Gen NOT DETECTED NOT DETECTED Final    Comment: (NOTE) The GeneXpert MRSA Assay (FDA approved for NASAL specimens only), is one component of a comprehensive MRSA colonization surveillance program. It is not intended to diagnose MRSA infection nor to guide or monitor treatment for MRSA infections. Test performance is not FDA approved in patients less than 26 years old. Performed at [facility], [address], [place], [place] [address]     Radiology Studies: PERIPHERAL VASCULAR CATHETERIZATION  Result Date: 06/28/2021 See surgical note for result.  Cashius Grandstaff T. Taylor  If 7PM-7AM, please contact night-coverage [email or web address] 06/29/2021, 12:35 PM

## 2021-06-29 NOTE — Progress Notes (Signed)
Daily Progress Note   Subjective  - 1 Day Post-Op  Follow-up heel ulceration.  Patient underwent MRI and angio yesterday.  Objective Vitals:   06/28/21 1849 06/29/21 0431 06/29/21 0500 06/29/21 0815  BP: 134/69 122/79  (!) 100/56  Pulse: 84 76  81  Resp: 17 16    Temp: 98.8 F (37.1 C) 98.7 F (37.1 C)  99.1 F (37.3 C)  TempSrc: Oral Oral  Oral  SpO2:  99%  98%  Weight:   92.8 kg   Height:        Physical Exam: MRI was consistent with osteomyelitis of the posterior calcaneus.  Angio noted extremely poor circulation to the heel.  There was no ability to revascularize the heel region.   Laboratory CBC    Component Value Date/Time   WBC 14.1 (H) 06/29/2021 0536   HGB 8.5 (L) 06/29/2021 0536   HCT 25.4 (L) 06/29/2021 0536   PLT 251 06/29/2021 0536    BMET    Component Value Date/Time   NA 135 06/29/2021 0536   NA 140 12/11/2017 0000   K 4.4 06/29/2021 0536   CL 95 (L) 06/29/2021 0536   CO2 27 06/29/2021 0536   GLUCOSE 159 (H) 06/29/2021 0536   BUN 53 (H) 06/29/2021 0536   BUN 16 12/11/2017 0000   CREATININE 5.91 (H) 06/29/2021 0536   CALCIUM 8.5 (L) 06/29/2021 0536   CALCIUM 8.4 (L) 10/05/2017 0615   GFRNONAA 9 (L) 06/29/2021 0536   GFRAA 21 (L) 12/04/2019 2027    Assessment/Planning: Necrotic ulceration with severe peripheral vascular disease  Unfortunately patient is not good to be a candidate for limb salvage.  His circulation is too poor to be able to heal any type of debridement.  At this time I have recommended below the knee amputation.  I have discussed this with vascular surgery and they will follow-up in the near future.   Samara Deist A  06/29/2021, 12:14 PM

## 2021-06-29 NOTE — Consult Note (Signed)
Consultation Note Date: 06/29/2021   Patient Name: Ernest [name]  DOB: 1946/03/20  MRN: [id number]  Age / Sex: 75 y.o., male  PCP: Patient, [name] (Inactive) Referring Physician: Mercy Riding, MD  Reason for Consultation: Establishing goals of care  HPI/Patient Profile: 75 y.o. male  with past medical history of ESRD on HD, CHF, COPD, CAD, DVT, GERD, gout, hep C, htn, hld, and CVA admitted on 06/26/2021 with fever and chills. The patient has a right heel decubitus ulcer for which has been getting oral antibiotics however the wound has been getting progressively worse. Patient diagnosed with sepsis secondary to R heel ulcer. MRI right ankle without contrast showed large wound overlying the posterior aspect of the calcaneus with mild subcortical bone marrow edema in the posterior calcaneus most consistent with osteomyelitis, partial-thickness tear of the Achilles tendon along the medial calcaneal insertion and plantar fasciitis. Podiatry is recommending BKA and this is tentatively planned for 9/16. PMT consulted to discuss [place].   Clinical Assessment and Goals of Care: I have reviewed medical records including EPIC notes, labs and imaging, assessed the patient and then met with patient  to discuss diagnosis prognosis, GOC, EOL wishes, disposition and options.  I introduced Palliative Medicine as specialized medical care for people living with serious illness. It focuses on providing relief from the symptoms and stress of a serious illness. The goal is to improve quality of life for both the patient and the family.  Patient tells me he has been living at [facility] for about 7 months. Tells me he spends most of the time in wheelchair or bed. Tells me his appetite is okay.   Patient tells me he is having mild pain is his R lower extremity.   We discussed patient's current illness and what it means in the larger context of patient's on-going  co-morbidities. We discuss his severe wound. Discuss his comorbidities.   I attempted to elicit values and goals of care important to the patient.    Patient shares with me he would never want to be on a ventilator and would not want CPR. He tells me when he dies he wants to die peacefully and without resuscitation attempts.   Patient also tells me his daughter Ernest [name] helps him make medical decisions and he agrees for me to call her.   I call Ernest [name] and share above conversation with her.   Ernest [name] tells me she thinks patient has been depressed since his wife passed away in 10/24/22. Ernest [name] tells me she feels patient has been doing well at [facility].   She is concerned about plan for amputation. Tells me she does not think this is a good idea and wants a second opinion. I tell her I will ask vascular surgery/podiatry to speak with her further about this.  I share with her patient's stated wishes about DNR status. Ernest [name] tells me she disagrees with this. Encouraged her to consider DNR/DNI status understanding evidenced based poor outcomes in similar hospitalized patients, as the cause of the arrest is likely associated with chronic/terminal disease rather than a reversible acute cardio-pulmonary event. Ernest [name] again tells me she disagrees. We discuss concept of making decisions on behalf of a patient and attempting to honor their previously stated wishes. She tells me that patient has told her before that he would not want CPR. We discuss that this is very appropriate for him. Again, she disagrees.  She ends conversation by asking that someone from vascular surgery call her  to discuss amputation.   Primary Decision Maker HCPOA daughter Ernest [name]    SUMMARY OF RECOMMENDATIONS   - patient requests DNR status however d/t his impaired cognitive status discussed with his daughter who is HCPOA/next of kin - she disagrees with DNR status - daughter does share she will be flying to [place] next week and  she will discuss further then but not willing to discuss any further now  - it is possible that patient does maintain capacity to make decisions for himself despite dementia diagnosis - he was able to tell me that he did not want CPR and would instead want a peaceful death - if daughter and patient continue to disagree on code status could consider capacity evaluation for patient to make decisions for himself regarding code status and surgery - daughter requests speaking to vascular surgery about amputation - tells me she does not want this, wants a second opinion - have shared this with vascular and podiatry - patient with mild pain in RLE - scheduled tylenol 1000 mg TID  Code Status/Advance Care Planning: Full code  Discharge Planning: [place] for rehab with Palliative care service follow-up      Primary Diagnoses: Present on Admission:  Sepsis ([place])   I have reviewed the medical record, interviewed the patient and family, and examined the patient. The following aspects are pertinent.  Past Medical History:  Diagnosis Date   Abnormal stress test    s/p cath November 2013 with modest disease involving the ostial left main, proximal LAD, proximal RCA - do not appear to be hemodynamically signficant; mild LV dysfunction   Anemia    low iron   Anxiety    Arthritis    Bacteremia    Chronic systolic CHF (congestive heart failure) (HCC)    COPD (chronic obstructive pulmonary disease) ([place])    Coronary artery disease    a. per cath report 2013, b. 09/13/2017-CABG X4 & Mitral valve repair   Depression    DVT (deep venous thrombosis) (HCC)    arm - right- finished blood thinner- not sure when it   Ejection fraction < 50%    Erectile dysfunction    penile implant   ESRD (end stage renal disease) ([place])    [facility] on a MWF schedule.      Gout    Hx: of   Hepatitis C    History of seizures    last one 2018   Hyperlipidemia    Hypertension    Mitral valve disease     s/p Mitral repair 09/13/17   Obesity    Retinopathy    Shortness of breath    Hx: of with exertion   Stroke ([place])    "several" left side weakness, speech affected   Type II or unspecified type diabetes mellitus without mention of complication, not stated as uncontrolled    adult onset   Social History   Socioeconomic History   Marital status: Married    Spouse name: Not on file   Number of children: Not on file   Years of education: Not on file   Highest education level: Not on file  Occupational History   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  Vaping Use   Vaping Use: Never used  Substance and Sexual Activity   Alcohol use: No    Alcohol/week: 0.0 standard drinks   Drug use: No   Sexual activity: Not on file  Other Topics Concern   Not on file  Social History Narrative   Adopted mentally retarded child at home, 2 adopted children, 3 living and 2 deceased.   Social Determinants of Health   Financial Resource Strain: Not on file  Food Insecurity: Not on file  Transportation Needs: Not on file  Physical Activity: Not on file  Stress: Not on file  Social Connections: Not on file   Family History  Problem Relation Age of Onset   Heart disease Father    Diabetes Sister    Diabetes Brother    Diabetes Son    Scheduled Meds:  allopurinol  100 mg Oral Daily   amiodarone  200 mg Oral Daily   aspirin EC  81 mg Oral Daily   atorvastatin  40 mg Oral Daily   Chlorhexidine Gluconate Cloth  6 each Topical Q0600   clopidogrel  75 mg Oral Q breakfast   docusate sodium  100 mg Oral Daily   [START ON 06/30/2021] epoetin (EPOGEN/PROCRIT) injection  10,000 Units Intravenous Q M,W,F-HD   heparin injection (subcutaneous)  5,000 Units Subcutaneous Q8H    insulin detemir  17 Units Subcutaneous QHS   levETIRAcetam  500 mg Oral BID   levETIRAcetam  500 mg Oral Once per day on Mon Wed Fri   mirtazapine  7.5 mg Oral QHS   polyethylene glycol  17 g Oral Daily   senna-docusate  1 tablet Oral QHS   sertraline  25 mg Oral QHS   sevelamer carbonate  800 mg Oral TID WC   sodium chloride flush  3 mL Intravenous Q12H   Continuous Infusions:  sodium chloride     ceFEPime (MAXIPIME) IV Stopped (06/29/21 0954)   metronidazole Stopped (06/29/21 1126)   vancomycin 1,000 mg (06/28/21 1131)   PRN Meds:.sodium chloride, acetaminophen **OR** acetaminophen, feeding supplement (NEPRO CARB STEADY), ondansetron **OR** ondansetron (ZOFRAN) IV, ondansetron (ZOFRAN) IV, ondansetron (ZOFRAN) IV, oxyCODONE, sodium chloride flush, traZODone [name] Review of Systems  Constitutional:  Negative for activity change and appetite change.  Respiratory:  Negative for shortness of breath.   Gastrointestinal:  Negative for nausea.  Musculoskeletal:        Mild pain in RLE   Physical Exam Constitutional:      General: He is not in acute distress. Pulmonary:     Effort: Pulmonary effort is normal.  Skin:    General: Skin is warm and dry.  Neurological:     Mental Status: He is alert and oriented to person, place, and time.    Vital Signs: BP (!) 97/48 (BP Location: Left Arm) Comment: RN Marion notified  Pulse 77   Temp 98.9 F (37.2 C) (Oral)   Resp 16   Ht '5\' 9"'  (1.753 m)   Wt 92.8 kg   SpO2 99%   BMI 30.21 kg/m  Pain Scale: 0-10 POSS *See Group Information*: S-Acceptable,Sleep, easy to arouse Pain Score: 0-No pain   SpO2: SpO2: 99 % O2 Device:SpO2: 99 % O2 Flow Rate: .O2 Flow Rate (L/min): 3 L/min  IO: Intake/output summary:  Intake/Output Summary (Last 24 hours) at 06/29/2021 1854 Last data filed at 06/29/2021 1757 Gross per 24 hour  Intake 831.58 ml  Output --  Net 831.58 ml  LBM: Last BM Date: 06/29/21 Baseline Weight:  Weight: 82 kg Most recent weight: Weight: 92.8 kg     Palliative Assessment/Data: PPS 30%    Time Total: 85 minutes Greater than 50%  of this time was spent counseling and coordinating care related to the above assessment and plan.  Juel Burrow, DNP, AGNP-C Palliative Medicine Team [phone] Pager: [phone]

## 2021-06-29 NOTE — Progress Notes (Signed)
[facility]  ROUNDING NOTE   Subjective:   Angiogram yesterday. Balloon angioplasty by Dr. Delana Meyer of common femoral artery.   Hemodialysis treatment yesterday. Tolerated treatment well. UF 737m.   Objective:  Vital signs in last 24 hours:  Temp:  [98.2 F (36.8 C)-99.1 F (37.3 C)] 99.1 F (37.3 C) (09/15 0815) Pulse Rate:  [72-84] 81 (09/15 0815) Resp:  [16-25] 16 (09/15 0431) BP: (99-154)/(56-79) 100/56 (09/15 0815) SpO2:  [96 %-99 %] 98 % (09/15 0815) Weight:  [88.7 kg-92.8 kg] 92.8 kg (09/15 0500)  Weight change: 0 kg Filed Weights   06/28/21 0500 06/28/21 1540 06/29/21 0500  Weight: 88.7 kg 88.7 kg 92.8 kg    Intake/Output: I/O last 3 completed shifts: In: 391.6 [IV Piggyback:391.6] Out: 790 [Other:790]   Intake/Output this shift:  Total I/O In: 440 [P.O.:240; IV Piggyback:200] Out: -   Physical Exam: General: NAD, resting in bed  Head: Normocephalic, atraumatic. Moist oral mucosal membranes  Eyes: Anicteric  Lungs:  Clear to auscultation, normal effort, [place] O2  Heart: Regular rate and rhythm  Abdomen:  Soft, nontender  Extremities:  no peripheral edema.  Neurologic: Nonfocal, moving all four extremities  Skin: No lesions  Access: Rt groin Permcath    Basic Metabolic Panel: Recent Labs  Lab 06/27/21 0042 06/27/21 0611 06/28/21 0311 06/29/21 0536  NA 135 136 137 135  K 5.3* 5.1 4.9 4.4  CL 93* 94* 97* 95*  CO2 '28 30 25 27  '$ GLUCOSE 330* 296* 91 159*  BUN 62* 73* 84* 53*  CREATININE 7.02* 7.31* 8.67* 5.91*  CALCIUM 8.1* 8.1* 8.3* 8.5*  MG  --   --   --  2.0  PHOS  --   --   --  4.9*     Liver Function Tests: Recent Labs  Lab 06/27/21 0042 06/29/21 0536  AST 16  --   ALT 14  --   ALKPHOS 110  --   BILITOT 0.6  --   PROT 6.6  --   ALBUMIN 2.4* 2.4*    No results for input(s): LIPASE, AMYLASE in the last 168 hours. No results for input(s): AMMONIA in the last 168 hours.  CBC: Recent Labs  Lab 06/27/21 0042  06/27/21 0611 06/29/21 0536  WBC 19.6* 19.6* 14.1*  NEUTROABS 18.6*  --   --   HGB 9.1* 9.1* 8.5*  HCT 28.3* 29.1* 25.4*  MCV 89.3 87.9 87.0  PLT 272 267 251     Cardiac Enzymes: No results for input(s): CKTOTAL, CKMB, CKMBINDEX, TROPONINI in the last 168 hours.  BNP: Invalid input(s): POCBNP  CBG: Recent Labs  Lab 06/27/21 2107 06/28/21 1533 06/28/21 1745  GLUCAP 145* 140* 144*     Microbiology: Results for orders placed or performed during the hospital encounter of 06/26/21  Blood culture (routine x 2)     Status: None (Preliminary result)   Collection Time: 06/27/21 12:42 AM   Specimen: BLOOD  Result Value Ref Range Status   Specimen Description BLOOD RIGHT ASSIST CONTROL  Final   Special Requests   Final    BOTTLES DRAWN AEROBIC AND ANAEROBIC Blood Culture adequate volume   Culture   Final    NO GROWTH 2 DAYS Performed at [facility] [address] [place] [address]   Report Status PENDING  Incomplete  Resp Panel by RT-PCR (Flu A&B, Covid) Nasopharyngeal Swab     Status: None   Collection Time: 06/27/21 12:42 AM   Specimen: Nasopharyngeal Swab;  Nasopharyngeal(NP) swabs in vial transport medium  Result Value Ref Range Status   SARS Coronavirus 2 by RT PCR NEGATIVE NEGATIVE Final    Comment: (NOTE) SARS-CoV-2 target nucleic acids are NOT DETECTED.  The SARS-CoV-2 RNA is generally detectable in upper respiratory specimens during the acute phase of infection. The lowest concentration of SARS-CoV-2 viral copies this assay can detect is 138 copies/mL. A negative result does not preclude SARS-Cov-2 infection and should not be used as the sole basis for treatment or other patient management decisions. A negative result may occur with  improper specimen collection/handling, submission of specimen other than nasopharyngeal swab, presence of viral mutation(s) within the areas targeted by this assay, and inadequate number of viral copies(<138  copies/mL). A negative result must be combined with clinical observations, patient history, and epidemiological information. The expected result is Negative.  Fact Sheet for Patients:  EntrepreneurPulse.com.au  Fact Sheet for Healthcare Providers:  IncredibleEmployment.be  This test is no t yet approved or cleared by the [place] FDA and  has been authorized for detection and/or diagnosis of SARS-CoV-2 by FDA under an Emergency Use Authorization (EUA). This EUA will remain  in effect (meaning this test can be used) for the duration of the COVID-19 declaration under Section 564(b)(1) of the Act, 21 U.S.C.section 360bbb-3(b)(1), unless the authorization is terminated  or revoked sooner.       Influenza A by PCR NEGATIVE NEGATIVE Final   Influenza B by PCR NEGATIVE NEGATIVE Final    Comment: (NOTE) The Xpert Xpress SARS-CoV-2/FLU/RSV plus assay is intended as an aid in the diagnosis of influenza from Nasopharyngeal swab specimens and should not be used as a sole basis for treatment. Nasal washings and aspirates are unacceptable for Xpert Xpress SARS-CoV-2/FLU/RSV testing.  Fact Sheet for Patients: EntrepreneurPulse.com.au  Fact Sheet for Healthcare Providers: IncredibleEmployment.be  This test is not yet approved or cleared by the [place] FDA and has been authorized for detection and/or diagnosis of SARS-CoV-2 by FDA under an Emergency Use Authorization (EUA). This EUA will remain in effect (meaning this test can be used) for the duration of the COVID-19 declaration under Section 564(b)(1) of the Act, 21 U.S.C. section 360bbb-3(b)(1), unless the authorization is terminated or revoked.  Performed at [facility], [address] [place], [place] [address]   Blood culture (routine x 2)     Status: None (Preliminary result)   Collection Time: 06/27/21 12:43 AM   Specimen: BLOOD   Result Value Ref Range Status   Specimen Description BLOOD RIGHT FOREARM  Final   Special Requests   Final    BOTTLES DRAWN AEROBIC AND ANAEROBIC Blood Culture adequate volume   Culture   Final    NO GROWTH 2 DAYS Performed at [facility], [address] [place], [place] [address]    Report Status PENDING  Incomplete  MRSA Next Gen by PCR, Nasal     Status: None   Collection Time: 06/28/21  5:31 AM   Specimen: Nasal Mucosa; Nasal Swab  Result Value Ref Range Status   MRSA by PCR Next Gen NOT DETECTED NOT DETECTED Final    Comment: (NOTE) The GeneXpert MRSA Assay (FDA approved for NASAL specimens only), is one component of a comprehensive MRSA colonization surveillance program. It is not intended to diagnose MRSA infection nor to guide or monitor treatment for MRSA infections. Test performance is not FDA approved in patients less than 2 years old. Performed at [facility], [address] [place],  [place] [address]     Coagulation Studies: Recent Labs    06/27/21 0226 06/27/21 0611  LABPROT 16.7* 16.6*  INR 1.4* 1.4*     Urinalysis: No results for input(s): COLORURINE, LABSPEC, PHURINE, GLUCOSEU, HGBUR, BILIRUBINUR, KETONESUR, PROTEINUR, UROBILINOGEN, NITRITE, LEUKOCYTESUR in the last 72 hours.  Invalid input(s): APPERANCEUR    Imaging: MR ANKLE RIGHT WO CONTRAST  Result Date: 06/28/2021 CLINICAL DATA:  Large ulcer along the right heel with worsening pain. EXAM: MRI OF THE RIGHT ANKLE WITHOUT CONTRAST TECHNIQUE: Multiplanar, multisequence MR imaging of the ankle was performed. No intravenous contrast was administered. COMPARISON:  None. FINDINGS: TENDONS Peroneal: Peroneal longus tendon intact. Peroneal brevis intact. Posteromedial: Posterior tibial tendon intact. Flexor hallucis longus tendon intact. Flexor digitorum longus tendon intact. Anterior: Tibialis anterior tendon intact. Extensor hallucis longus tendon intact Extensor digitorum longus  tendon intact. Achilles: Partial-thickness tear of the Achilles tendon along the medial calcaneal insertion. Plantar Fascia: Thickening of the medial band of the plantar fascia with increased signal consistent with plantar fasciitis. LIGAMENTS Lateral: Anterior talofibular ligament intact. Calcaneofibular ligament intact. Posterior talofibular ligament intact. Anterior and posterior tibiofibular ligaments intact. Medial: Deltoid ligament intact. Spring ligament intact. CARTILAGE Ankle Joint: No joint effusion. Normal ankle mortise. No chondral defect. Subtalar Joints/Sinus Tarsi: Normal subtalar joints. No subtalar joint effusion. Normal sinus tarsi. Bones and Soft Tissue: Large wound overlying the posterior aspect of the calcaneus. Mild subcortical bone marrow edema in the posterior calcaneus most consistent with osteomyelitis. No acute fracture or dislocation. Incidental note made of an os naviculare. T2 hyperintense signal throughout the plantar musculature likely neurogenic. IMPRESSION: 1. Large wound overlying the posterior aspect of the calcaneus. Mild subcortical bone marrow edema in the posterior calcaneus most consistent with osteomyelitis. 2. Partial-thickness tear of the Achilles tendon along the medial calcaneal insertion. 3. Thickening of the medial band of the plantar fascia with increased signal consistent with plantar fasciitis. Electronically Signed   By: Kathreen Devoid M.D.   On: 06/28/2021 06:59   PERIPHERAL VASCULAR CATHETERIZATION  Result Date: 06/28/2021 See surgical note for result.  ECHOCARDIOGRAM COMPLETE  Result Date: 06/27/2021    ECHOCARDIOGRAM REPORT   Patient Name:   Ernest [name] Date of Exam: 06/27/2021 Medical Rec #:  [id number]       Height:       69.0 in Accession #:    [id number]      Weight:       180.8 lb Date of Birth:  12-24-1945       BSA:          1.979 m Patient Age:    75 years        BP:           88/50 mmHg Patient Gender: M               HR:           67 bpm. Exam  Location:  [facility] Procedure: 2D Echo, Color Doppler and Cardiac Doppler Indications:     CHF- acute systolic [id number]  History:         Patient has prior history of Echocardiogram examinations, most                  recent 09/13/2017. COPD; Risk Factors:Hypertension and                  Diabetes. DVT.  Sonographer:     Sherrie Sport Referring Phys:  [id number] [place] Diagnosing Phys: Harrell Gave  End MD  Sonographer Comments: Suboptimal apical window. IMPRESSIONS  1. Left ventricular ejection fraction, by estimation, is 50 to 55%. The left ventricle has low normal function. Left ventricular endocardial border not optimally defined to evaluate regional wall motion. There is moderate left ventricular hypertrophy. Left ventricular diastolic parameters are consistent with Grade II diastolic dysfunction (pseudonormalization). Elevated left atrial pressure.  2. Right ventricular systolic function is normal. The right ventricular size is normal. Tricuspid regurgitation signal is inadequate for assessing PA pressure.  3. Left atrial size was mildly dilated.  4. Right atrial size was mildly dilated.  5. The mitral valve is degenerative. Trivial mitral valve regurgitation. No evidence of mitral stenosis. Moderate to severe mitral annular calcification.  6. The aortic valve is tricuspid. There is mild calcification of the aortic valve. There is mild thickening of the aortic valve. Aortic valve regurgitation is not visualized. Mild to moderate aortic valve sclerosis/calcification is present, without any evidence of aortic stenosis.  7. Aortic dilatation noted. There is borderline dilatation of the aortic root, measuring 38 mm.  8. The inferior vena cava is normal in size with <50% respiratory variability, suggesting right atrial pressure of 8 mmHg. FINDINGS  Left Ventricle: Left ventricular ejection fraction, by estimation, is 50 to 55%. The left ventricle has low normal function. Left ventricular endocardial border not optimally  defined to evaluate regional wall motion. The left ventricular internal cavity  size was normal in size. There is moderate left ventricular hypertrophy. Left ventricular diastolic parameters are consistent with Grade II diastolic dysfunction (pseudonormalization). Elevated left atrial pressure. Right Ventricle: The right ventricular size is normal. No increase in right ventricular wall thickness. Right ventricular systolic function is normal. Tricuspid regurgitation signal is inadequate for assessing PA pressure. Left Atrium: Left atrial size was mildly dilated. Right Atrium: Right atrial size was mildly dilated. Pericardium: There is no evidence of pericardial effusion. Mitral Valve: The mitral valve is degenerative in appearance. There is mild thickening of the mitral valve leaflet(s). Moderate to severe mitral annular calcification. Trivial mitral valve regurgitation. No evidence of mitral valve stenosis. Tricuspid Valve: The tricuspid valve is not well visualized. Tricuspid valve regurgitation is trivial. Aortic Valve: The aortic valve is tricuspid. There is mild calcification of the aortic valve. There is mild thickening of the aortic valve. Aortic valve regurgitation is not visualized. Mild to moderate aortic valve sclerosis/calcification is present, without any evidence of aortic stenosis. Aortic valve mean gradient measures 2.0 mmHg. Aortic valve peak gradient measures 3.6 mmHg. Aortic valve area, by VTI measures 3.00 cm. Pulmonic Valve: The pulmonic valve was not well visualized. Pulmonic valve regurgitation is not visualized. No evidence of pulmonic stenosis. Aorta: Aortic dilatation noted. There is borderline dilatation of the aortic root, measuring 38 mm. Pulmonary Artery: The pulmonary artery is not well seen. Venous: The inferior vena cava is normal in size with less than 50% respiratory variability, suggesting right atrial pressure of 8 mmHg. IAS/Shunts: The interatrial septum was not well  visualized.  LEFT VENTRICLE PLAX 2D LVIDd:         4.06 cm  Diastology LVIDs:         3.01 cm  LV e' medial:    4.35 cm/s LV PW:         1.37 cm  LV E/e' medial:  21.3 LV IVS:        1.35 cm  LV e' lateral:   8.92 cm/s LVOT diam:     2.10 cm  LV E/e' lateral: 10.4  LV SV:         49 LV SV Index:   25 LVOT Area:     3.46 cm  LEFT ATRIUM             Index       RIGHT ATRIUM           Index LA diam:        3.50 cm 1.77 cm/m  RA Area:     18.40 cm LA Vol (A2C):   69.9 ml 35.32 ml/m RA Volume:   55.40 ml  27.99 ml/m LA Vol (A4C):   68.3 ml 34.51 ml/m LA Biplane Vol: 69.3 ml 35.02 ml/m  AORTIC VALVE                   PULMONIC VALVE AV Area (Vmax):    2.75 cm    PV Vmax:        0.61 m/s AV Area (Vmean):   2.37 cm    PV Peak grad:   1.5 mmHg AV Area (VTI):     3.00 cm    RVOT Peak grad: 2 mmHg AV Vmax:           94.70 cm/s AV Vmean:          65.200 cm/s AV VTI:            0.164 m AV Peak Grad:      3.6 mmHg AV Mean Grad:      2.0 mmHg LVOT Vmax:         75.10 cm/s LVOT Vmean:        44.700 cm/s LVOT VTI:          0.142 m LVOT/AV VTI ratio: 0.87  AORTA Ao Root diam: 3.80 cm MITRAL VALVE MV Area (PHT): 3.63 cm    SHUNTS MV Decel Time: 209 msec    Systemic VTI:  0.14 m MV E velocity: 92.50 cm/s  Systemic Diam: 2.10 cm MV A velocity: 84.00 cm/s MV E/A ratio:  1.10 Harrell Gave End MD Electronically signed by Nelva Bush MD Signature Date/Time: 06/27/2021/4:51:56 PM    Final      Medications:    sodium chloride     ceFEPime (MAXIPIME) IV Stopped (06/29/21 0954)   metronidazole Stopped (06/29/21 1126)   vancomycin 1,000 mg (06/28/21 1131)    allopurinol  100 mg Oral Daily   amiodarone  200 mg Oral Daily   aspirin EC  81 mg Oral Daily   atorvastatin  40 mg Oral Daily   Chlorhexidine Gluconate Cloth  6 each Topical Q0600   clopidogrel  75 mg Oral Q breakfast   docusate sodium  100 mg Oral Daily   furosemide  60 mg Intravenous Q12H   heparin injection (subcutaneous)  5,000 Units Subcutaneous Q8H    insulin detemir  17 Units Subcutaneous QHS   levETIRAcetam  500 mg Oral BID   levETIRAcetam  500 mg Oral Once per day on Mon Wed Fri   mirtazapine  7.5 mg Oral QHS   polyethylene glycol  17 g Oral Daily   senna-docusate  1 tablet Oral QHS   sertraline  25 mg Oral QHS   sevelamer carbonate  800 mg Oral TID WC   sodium chloride flush  3 mL Intravenous Q12H   sodium chloride, acetaminophen **OR** acetaminophen, feeding supplement (NEPRO CARB STEADY), HYDROmorphone (DILAUDID) injection, morphine injection, morphine injection, ondansetron **OR** ondansetron (ZOFRAN) IV, ondansetron (ZOFRAN) IV, ondansetron (ZOFRAN) IV, oxyCODONE, sodium chloride flush, traZODone  Assessment/ Plan:  Ernest [name] is a 75 y.o.  male with systolic congestive heart failure, COPD, CAD, GERD, Hepatitis C, hypertension, dyslipidemia and end stage renal disease on dialysis, who was admitted to [facility] on 06/26/2021 for Acute respiratory failure with hypoxia ([place]) [J96.01] Sepsis ([place]) [A41.9] Fever, unspecified fever cause [R50.9] Hypervolemia, unspecified hypervolemia type [E87.70] Osteomyelitis of right foot, unspecified type ([place]) [M86.9] Sepsis, due to unspecified organism, unspecified whether acute organ dysfunction present ([place]) [A41.9]  [facility] [address]/MWF/Rt groin Permcath  End stage renal disease on dialysis:  - Continue MWF schedule.    Anemia of chronic kidney disease : hemoglobin 8.5 - EPO with next dialysis treatment.    3. Secondary Hyperparathyroidism: phosphorus and calcium at goal.   - Sevelamer with meals.   4. Hypertension: 100/56.   - IV furosemide.   5. Diabetes mellitus type II with chronic kidney disease: insulin dependent. Complication of diabetic foot ulcer now with osteomyelitis.   - Appreciate vascular and podiatry  - vancomycin, metronidazole, cefepime.    LOS: 2 Harrold Fitchett 9/15/202211:57 AM

## 2021-06-30 ENCOUNTER — Encounter
Admission: EM | Disposition: A | Discharge status: Skilled Nursing Facility | Payer: Self-pay | Source: Home / Self Care | Attending: Student

## 2021-06-30 DIAGNOSIS — I739 Peripheral vascular disease, unspecified: Secondary | ICD-10-CM | POA: Diagnosis not present

## 2021-06-30 DIAGNOSIS — I5042 Chronic combined systolic (congestive) and diastolic (congestive) heart failure: Secondary | ICD-10-CM | POA: Diagnosis not present

## 2021-06-30 DIAGNOSIS — M869 Osteomyelitis, unspecified: Secondary | ICD-10-CM | POA: Diagnosis not present

## 2021-06-30 DIAGNOSIS — Z7189 Other specified counseling: Secondary | ICD-10-CM | POA: Diagnosis not present

## 2021-06-30 DIAGNOSIS — E109 Type 1 diabetes mellitus without complications: Secondary | ICD-10-CM | POA: Diagnosis not present

## 2021-06-30 DIAGNOSIS — Z515 Encounter for palliative care: Secondary | ICD-10-CM | POA: Diagnosis not present

## 2021-06-30 DIAGNOSIS — N186 End stage renal disease: Secondary | ICD-10-CM | POA: Diagnosis not present

## 2021-06-30 LAB — CBC
HCT: 26.9 % — ABNORMAL LOW (ref 39.0–52.0)
Hemoglobin: 9 g/dL — ABNORMAL LOW (ref 13.0–17.0)
MCH: 29.3 pg (ref 26.0–34.0)
MCHC: 33.5 g/dL (ref 30.0–36.0)
MCV: 87.6 fL (ref 80.0–100.0)
Platelets: 268 10*3/uL (ref 150–400)
RBC: 3.07 MIL/uL — ABNORMAL LOW (ref 4.22–5.81)
RDW: 15.4 % (ref 11.5–15.5)
WBC: 14.5 10*3/uL — ABNORMAL HIGH (ref 4.0–10.5)
nRBC: 0 % (ref 0.0–0.2)

## 2021-06-30 LAB — RENAL FUNCTION PANEL
Albumin: 2.4 g/dL — ABNORMAL LOW (ref 3.5–5.0)
Anion gap: 13 (ref 5–15)
BUN: 67 mg/dL — ABNORMAL HIGH (ref 8–23)
CO2: 27 mmol/L (ref 22–32)
Calcium: 8.6 mg/dL — ABNORMAL LOW (ref 8.9–10.3)
Chloride: 96 mmol/L — ABNORMAL LOW (ref 98–111)
Creatinine, Ser: 7.43 mg/dL — ABNORMAL HIGH (ref 0.61–1.24)
GFR, Estimated: 7 mL/min — ABNORMAL LOW (ref >60)
Glucose, Bld: 86 mg/dL (ref 70–99)
Phosphorus: 5.9 mg/dL — ABNORMAL HIGH (ref 2.5–4.6)
Potassium: 4 mmol/L (ref 3.5–5.1)
Sodium: 136 mmol/L (ref 135–145)

## 2021-06-30 LAB — PROTIME-INR
INR: 1.4 — ABNORMAL HIGH (ref 0.8–1.2)
Prothrombin Time: 16.9 seconds — ABNORMAL HIGH (ref 11.4–15.2)

## 2021-06-30 LAB — MAGNESIUM: Magnesium: 2.3 mg/dL (ref 1.7–2.4)

## 2021-06-30 LAB — ABO/RH: ABO/RH(D): B POS

## 2021-06-30 LAB — BASIC METABOLIC PANEL
Anion gap: 11 (ref 5–15)
BUN: 65 mg/dL — ABNORMAL HIGH (ref 8–23)
CO2: 27 mmol/L (ref 22–32)
Calcium: 8.6 mg/dL — ABNORMAL LOW (ref 8.9–10.3)
Chloride: 98 mmol/L (ref 98–111)
Creatinine, Ser: 7.45 mg/dL — ABNORMAL HIGH (ref 0.61–1.24)
GFR, Estimated: 7 mL/min — ABNORMAL LOW (ref >60)
Glucose, Bld: 88 mg/dL (ref 70–99)
Potassium: 4.1 mmol/L (ref 3.5–5.1)
Sodium: 136 mmol/L (ref 135–145)

## 2021-06-30 LAB — CORTISOL-AM, BLOOD: Cortisol - AM: 20 ug/dL (ref 6.7–22.6)

## 2021-06-30 LAB — APTT: aPTT: 46 seconds — ABNORMAL HIGH (ref 24–36)

## 2021-06-30 LAB — TSH: TSH: 1.428 u[IU]/mL (ref 0.350–4.500)

## 2021-06-30 LAB — GLUCOSE, CAPILLARY: Glucose-Capillary: 81 mg/dL (ref 70–99)

## 2021-06-30 SURGERY — AMPUTATION BELOW KNEE
Anesthesia: General | Site: Knee | Laterality: Right

## 2021-06-30 MED ORDER — VANCOMYCIN HCL IN DEXTROSE 1-5 GM/200ML-% IV SOLN
1000.0000 mg | INTRAVENOUS | Status: DC
  Administered 2021-06-30 – 2021-07-07 (×4): 1000 mg via INTRAVENOUS
  Filled 2021-06-30 (×5): qty 200

## 2021-06-30 MED ORDER — SODIUM CHLORIDE 0.9 % IV SOLN
1.0000 g | INTRAVENOUS | Status: DC
  Administered 2021-07-01 – 2021-07-09 (×9): 1 g via INTRAVENOUS
  Filled 2021-06-30 (×9): qty 1

## 2021-06-30 NOTE — Consult Note (Signed)
Pharmacy Antibiotic Note  Ernest [name] is a 75 y.o. male admitted on 06/26/2021 with sepsis.  Pharmacy has been consulted for vancomycin and cefepime dosing. Patient with history of ESRD on HD (MWF). Patient has had a pressure ulcer on the right heel for which she has been receiving oral antibiotics.  The wound has been getting progressively worse and now he has a fever.  Plan: Continue Cefepime 1 gram Q24H Continue Vancomycin 1000 mg qHD session -- follow up for inpatient regimen Goal pre-HD random: 15-25 Vancomycin random level ordered for 9/17 Pt daughter not consenting to move forward with R BKA at this time. Plans to come to hospital 9/22 to make decision - f/u plans to help assess abx LOT   Height: '5\' 9"'$  (175.3 cm) Weight: 91.4 kg (201 lb 9.6 oz) IBW/kg (Calculated) : 70.7  Temp (24hrs), Avg:98.6 F (37 C), Min:97.4 F (36.3 C), Max:99.3 F (37.4 C)  Recent Labs  Lab 06/27/21 0042 06/27/21 0226 06/27/21 0611 06/28/21 0311 06/29/21 0536 06/30/21 0730  WBC 19.6*  --  19.6*  --  14.1* 14.5*  CREATININE 7.02*  --  7.31* 8.67* 5.91* 7.45*  7.43*  LATICACIDVEN 3.1* 2.9*  --   --   --   --      Estimated Creatinine Clearance: 10 mL/min (A) (by C-G formula based on SCr of 7.43 mg/dL (H)).    [name]  Antimicrobials this admission: 9/13 Ceftriaxone >> x 1 9/13 Zosyn >> x 1 9/13 Vancomycin >> 9/13 Metronidazole>> 9/13 Cefepime>>   Microbiology results: 9/13 BCx: NGTD 9/14 MRSA PCR: negative  Thank you for allowing pharmacy to be a part of this patient's care.  Sherilyn Banker, PharmD, BCPS Clinical Pharmacist   06/30/2021 12:57 PM

## 2021-06-30 NOTE — Progress Notes (Signed)
[facility] Daily Progress Note  Spoke with the daughter [name]) who is the patient's power of attorney via the phone yesterday. At this time, the patient's daughter is not consenting to move forward with a right below the knee amputation. She notes that she will be able to fly in to [place] next Thursday and at that point she will make a decision.  Will continue to follow Discussed with Dr. Eber Hong [facility] [name] 06/30/2021 12:21 PM

## 2021-06-30 NOTE — Progress Notes (Signed)
PROGRESS NOTE  Ernest [name] [id number] DOB: [birth year]   PCP: Patient, [name] (Inactive)  Patient is from: SNF  DOA: 06/26/2021 LOS: 3  Chief complaints:  Chief Complaint  Patient presents with   Fever     Brief Narrative / Interim history: 75 year old M with PMH of ESRD on HD MWF, systolic CHF, COPD, CAD, DVT, CVA, hep C, HTN, HLD, GERD and cognitive impairment brought to ED from EMS with fever, chills and worsening right heel decubitus ulcer, and admitted for severe sepsis in the setting infected right heel decubitus ulcer and right heel osteomyelitis.  Started on vancomycin, cefepime and Flagyl.  Nephrology, podiatry and vascular surgery consulted.  MRI right ankle without contrast showed large wound overlying the posterior aspect of the calcaneus with mild subcortical bone marrow edema in the posterior calcaneus most consistent with osteomyelitis, partial-thickness tear of the Achilles tendon along the medial calcaneal insertion and plantar fasciitis.  Patient angiography that showed 70% right common femoral artery stenosis.  Podiatry and vascular surgery recommended BKA but patient's daughter likes to have second opinion.  Remains on broad-spectrum antibiotics  Subjective: Seen and examined earlier this morning.  No major events overnight of this morning.  No complaints.  He denies pain, shortness of breath, nausea, vomiting or abdominal pain.  Not a great historian  Objective: Vitals:   06/29/21 2230 06/30/21 0500 06/30/21 0541 06/30/21 0718  BP: (!) 102/53  (!) 124/58 (!) 124/55  Pulse: 70  (!) 59 61  Resp:   18 18  Temp: 99.3 F (37.4 C)  (!) 97.4 F (36.3 C) 98.6 F (37 C)  TempSrc:      SpO2: 95%  100% 100%  Weight:  91.4 kg    Height:        Intake/Output Summary (Last 24 hours) at 06/30/2021 1412 Last data filed at 06/30/2021 0932 Gross per 24 hour  Intake 100 ml  Output --  Net 100 ml   Filed Weights   06/28/21 1540 06/29/21 0500 06/30/21 0500   Weight: 88.7 kg 92.8 kg 91.4 kg    Examination:  GENERAL: No apparent distress.  Nontoxic. HEENT: MMM.  Vision and hearing grossly intact.  NECK: Supple.  No apparent JVD.  RESP: 100% on 3 L.  No IWOB.  Fair aeration bilaterally. CVS:  RRR. Heart sounds normal.  ABD/GI/GU: BS+. Abd soft, NTND.  MSK/EXT:  Moves extremities.  Faint DP pulses SKIN: Right heel ulceration NEURO: Awake.  Oriented to self, "hospital", city and state.  Limited insight.  No apparent focal neuro deficit. PSYCH: Calm.  No distress or agitation.  Procedures:  9/14-lower extremity catheterization with 70% stenosis of right common femoral artery  Microbiology summarized: COVID-19 and influenza PCR nonreactive. MRSA PCR screen negative. Blood cultures NGTD.  Assessment & Plan: Severe sepsis due to infected right heel ulcer and calcaneal osteomyelitis: POA.  Meets criteria with leukocytosis, tachypnea and lactic acidosis.  Blood cultures NGTD.  LV angiography with significant PAD as above. -Continue broad-spectrum antibiotics with vancomycin, cefepime and Flagyl. -Podiatry and vascular surgery recommended BKA but daughter, HCPOA not consenting over the phone until she gets here on 9/22. She is flying into [place].   Peripheral vascular disease: Lower extremity cauterization as above. -Continue aspirin and Plavix -Management per vascular surgery as above  ESRD on HD MWF: Bone mineral disorder -Nephrology managing-on scheduled  Chronic combined CHF-no cardiopulmonary symptoms.  TTE with LVEF of 50 to 55% (previously 45 to 50%), G2 DD.  Appears euvolemic  on exam.  -Fluid management with hemodialysis. -Also on IV Lasix.  History of CAD/CABG: Stable. -Continue aspirin and atorvastatin  Controlled IDDM-2 with ESRD and neuropathy: A1c 6.2% in 2019. Recent Labs  Lab 06/27/21 2107 06/28/21 1533 06/28/21 1745 06/29/21 2145  GLUCAP 145* 140* 144* 176*  -Continue current insulin regimen -Follow hemoglobin  A1c  Paroxysmal atrial fibrillation: Rate controlled.  Not on anticoagulation.  On aspirin and Plavix -Continue home amiodarone.  History of CVA/vascular dementia: Awake.  Oriented to self and partial place but limited insight.  He thinks he is in the hospital because his roommates tested positive for COVID.  -Continue home started on aspirin -Reorientation and delirium precautions.  History of seizures: Stable -Continue Keppra  Depression -Continue home Zoloft and Remeron  Constipation -Bowel regimen  Goal of care counseling: multiple comorbidities as above.  Poor long-term prognosis.  Still full code.  I do think patient comprehends such complaints discussion to make decision.  Patient's daughter, [facility] POA wants him to remain full code -Appreciate help by palliative care  Body mass index is 29.77 kg/m.       Pressure skin injury: POA Pressure Injury 06/26/21 Heel Right Unstageable - Full thickness tissue loss in which the base of the injury is covered by slough (yellow, tan, gray, green or brown) and/or eschar (tan, brown or black) in the wound bed. Pt has unstageable pressure injury (Active)  06/26/21 2319  Location: Heel  Location Orientation: Right  Staging: Unstageable - Full thickness tissue loss in which the base of the injury is covered by slough (yellow, tan, gray, green or brown) and/or eschar (tan, brown or black) in the wound bed.  Wound Description (Comments): Pt has unstageable pressure injury to R lateral heel, necrotic, eschar in base.  Present on Admission: Yes   DVT prophylaxis:  heparin injection 5,000 Units Start: 06/27/21 0600 SCDs Start: 06/27/21 0248  Code Status: Full code Family Communication: Updated patient's son over the phone on 9/15. Level of care: Med-Surg Status is: Inpatient  Remains inpatient appropriate because:Ongoing diagnostic testing needed not appropriate for outpatient work up, IV treatments appropriate due to intensity of illness or  inability to take PO, and Inpatient level of care appropriate due to severity of illness  Dispo: The patient is from: SNF              Anticipated d/c is to: SNF              Patient currently is not medically stable to d/c.   Difficult to place patient No       Consultants:  Nephrology Podiatry Vascular surgery Palliative medicine   Sch Meds:  Scheduled Meds:  acetaminophen  1,000 mg Oral TID   allopurinol  100 mg Oral Daily   amiodarone  200 mg Oral Daily   aspirin EC  81 mg Oral Daily   atorvastatin  40 mg Oral Daily   Chlorhexidine Gluconate Cloth  6 each Topical Q0600   clopidogrel  75 mg Oral Q breakfast   docusate sodium  100 mg Oral Daily   epoetin (EPOGEN/PROCRIT) injection  10,000 Units Intravenous Q M,W,F-HD   heparin injection (subcutaneous)  5,000 Units Subcutaneous Q8H   insulin detemir  17 Units Subcutaneous QHS   levETIRAcetam  500 mg Oral BID   levETIRAcetam  500 mg Oral Once per day on Mon Wed Fri   mirtazapine  7.5 mg Oral QHS   polyethylene glycol  17 g Oral Daily   senna-docusate  1 tablet Oral QHS   sertraline  25 mg Oral QHS   sevelamer carbonate  800 mg Oral TID WC   sodium chloride flush  3 mL Intravenous Q12H   Continuous Infusions:  sodium chloride     [START ON 07/01/2021] ceFEPime (MAXIPIME) IV     metronidazole Stopped (06/30/21 0022)   vancomycin     PRN Meds:.sodium chloride, feeding supplement (NEPRO CARB STEADY), ondansetron **OR** ondansetron (ZOFRAN) IV, oxyCODONE, sodium chloride flush, traZODone  Antimicrobials: Anti-infectives (From admission, onward)    Start     Dose/Rate Route Frequency Ordered Stop   07/01/21 1000  ceFEPIme (MAXIPIME) 1 g in sodium chloride 0.9 % 100 mL IVPB        1 g 200 mL/hr over 30 Minutes Intravenous Every 24 hours 06/30/21 1254     06/30/21 1600  vancomycin (VANCOCIN) IVPB 1000 mg/200 mL premix        1,000 mg 200 mL/hr over 60 Minutes Intravenous Every M-W-F (Hemodialysis) 06/30/21 1254      06/29/21 0800  ceFAZolin (ANCEF) IVPB 1 g/50 mL premix  Status:  Discontinued       Note to Pharmacy: To be given in specials   1 g 100 mL/hr over 30 Minutes Intravenous  Once 06/28/21 1441 06/28/21 1651   06/28/21 1513  ceFAZolin (ANCEF) 1-4 GM/50ML-% IVPB       Note to Pharmacy: Corlis Hove   : cabinet override      06/28/21 1513 06/29/21 0314   06/28/21 1200  vancomycin (VANCOCIN) IVPB 1000 mg/200 mL premix  Status:  Discontinued        1,000 mg 200 mL/hr over 60 Minutes Intravenous Every M-W-F (Hemodialysis) 06/27/21 0959 06/30/21 1254   06/27/21 1100  metroNIDAZOLE (FLAGYL) IVPB 500 mg        500 mg 100 mL/hr over 60 Minutes Intravenous Every 12 hours 06/27/21 0250 07/04/21 1059   06/27/21 1100  vancomycin (VANCOREADY) IVPB 750 mg/150 mL        750 mg 150 mL/hr over 60 Minutes Intravenous  Once 06/27/21 1000 06/27/21 1137   06/27/21 1000  ceFEPIme (MAXIPIME) 1 g in sodium chloride 0.9 % 100 mL IVPB  Status:  Discontinued        1 g 200 mL/hr over 30 Minutes Intravenous  Once 06/27/21 0250 06/27/21 0316   06/27/21 1000  ceFEPIme (MAXIPIME) 1 g in sodium chloride 0.9 % 100 mL IVPB  Status:  Discontinued        1 g 200 mL/hr over 30 Minutes Intravenous Every 24 hours 06/27/21 0316 06/30/21 1254   06/27/21 0336  vancomycin (VANCOCIN) IVPB 1000 mg/200 mL premix  Status:  Discontinued        1,000 mg 200 mL/hr over 60 Minutes Intravenous Every Dialysis 06/27/21 0337 06/27/21 1258   06/27/21 0330  vancomycin (VANCOREADY) IVPB 750 mg/150 mL  Status:  Discontinued        750 mg 150 mL/hr over 60 Minutes Intravenous  Once 06/27/21 0258 06/27/21 0959   06/27/21 0300  vancomycin (VANCOCIN) IVPB 1000 mg/200 mL premix  Status:  Discontinued        1,000 mg 200 mL/hr over 60 Minutes Intravenous  Once 06/27/21 0250 06/27/21 0258   06/27/21 0115  piperacillin-tazobactam (ZOSYN) IVPB 3.375 g        3.375 g 100 mL/hr over 30 Minutes Intravenous  Once 06/27/21 0112 06/27/21 0255   06/26/21  2330  cefTRIAXone (ROCEPHIN) 1 g in sodium chloride 0.9 % 100  mL IVPB  Status:  Discontinued        1 g 200 mL/hr over 30 Minutes Intravenous  Once 06/26/21 2321 06/27/21 0111   06/26/21 2330  vancomycin (VANCOCIN) IVPB 1000 mg/200 mL premix        1,000 mg 200 mL/hr over 60 Minutes Intravenous  Once 06/26/21 2321 06/27/21 0210        I have personally reviewed the following labs and images: CBC: Recent Labs  Lab 06/27/21 0042 06/27/21 0611 06/29/21 0536 06/30/21 0730  WBC 19.6* 19.6* 14.1* 14.5*  NEUTROABS 18.6*  --   --   --   HGB 9.1* 9.1* 8.5* 9.0*  HCT 28.3* 29.1* 25.4* 26.9*  MCV 89.3 87.9 87.0 87.6  PLT 272 267 251 268   BMP &GFR Recent Labs  Lab 06/27/21 0042 06/27/21 0611 06/28/21 0311 06/29/21 0536 06/30/21 0730  NA 135 136 137 135 136  136  K 5.3* 5.1 4.9 4.4 4.1  4.0  CL 93* 94* 97* 95* 98  96*  CO2 '28 30 25 27 27  27  '$ GLUCOSE 330* 296* 91 159* 88  86  BUN 62* 73* 84* 53* 65*  67*  CREATININE 7.02* 7.31* 8.67* 5.91* 7.45*  7.43*  CALCIUM 8.1* 8.1* 8.3* 8.5* 8.6*  8.6*  MG  --   --   --  2.0 2.3  PHOS  --   --   --  4.9* 5.9*   Estimated Creatinine Clearance: 10 mL/min (A) (by C-G formula based on SCr of 7.43 mg/dL (H)). Liver & Pancreas: Recent Labs  Lab 06/27/21 0042 06/29/21 0536 06/30/21 0730  AST 16  --   --   ALT 14  --   --   ALKPHOS 110  --   --   BILITOT 0.6  --   --   PROT 6.6  --   --   ALBUMIN 2.4* 2.4* 2.4*   No results for input(s): LIPASE, AMYLASE in the last 168 hours. No results for input(s): AMMONIA in the last 168 hours. Diabetic: No results for input(s): HGBA1C in the last 72 hours. Recent Labs  Lab 06/27/21 2107 06/28/21 1533 06/28/21 1745 06/29/21 2145  GLUCAP 145* 140* 144* 176*   Cardiac Enzymes: No results for input(s): CKTOTAL, CKMB, CKMBINDEX, TROPONINI in the last 168 hours. No results for input(s): PROBNP in the last 8760 hours. Coagulation Profile: Recent Labs  Lab 06/27/21 0226  06/27/21 0611 06/30/21 0730  INR 1.4* 1.4* 1.4*   Thyroid Function Tests: Recent Labs    06/30/21 0730  TSH 1.428   Lipid Profile: No results for input(s): CHOL, HDL, LDLCALC, TRIG, CHOLHDL, LDLDIRECT in the last 72 hours. Anemia Panel: No results for input(s): VITAMINB12, FOLATE, FERRITIN, TIBC, IRON, RETICCTPCT in the last 72 hours. Urine analysis:    Component Value Date/Time   COLORURINE YELLOW 04/29/2015 1702   APPEARANCEUR CLEAR 04/29/2015 1702   LABSPEC 1.016 04/29/2015 1702   PHURINE 7.5 04/29/2015 1702   GLUCOSEU >1000 (A) 04/29/2015 1702   HGBUR SMALL (A) 04/29/2015 1702   BILIRUBINUR NEGATIVE 04/29/2015 1702   KETONESUR NEGATIVE 04/29/2015 1702   PROTEINUR >300 (A) 04/29/2015 1702   UROBILINOGEN 0.2 04/29/2015 1702   NITRITE NEGATIVE 04/29/2015 1702   LEUKOCYTESUR NEGATIVE 04/29/2015 1702   Sepsis Labs: Invalid input(s): PROCALCITONIN, Dolores  Microbiology: Recent Results (from the past 240 hour(s))  Blood culture (routine x 2)     Status: None (Preliminary result)   Collection Time: 06/27/21 12:42 AM   Specimen:  BLOOD  Result Value Ref Range Status   Specimen Description BLOOD RIGHT ASSIST CONTROL  Final   Special Requests   Final    BOTTLES DRAWN AEROBIC AND ANAEROBIC Blood Culture adequate volume   Culture   Final    NO GROWTH 3 DAYS Performed at [facility], [address] [place], [place] [address]    Report Status PENDING  Incomplete  Resp Panel by RT-PCR (Flu A&B, Covid) Nasopharyngeal Swab     Status: None   Collection Time: 06/27/21 12:42 AM   Specimen: Nasopharyngeal Swab; Nasopharyngeal(NP) swabs in vial transport medium  Result Value Ref Range Status   SARS Coronavirus 2 by RT PCR NEGATIVE NEGATIVE Final    Comment: (NOTE) SARS-CoV-2 target nucleic acids are NOT DETECTED.  The SARS-CoV-2 RNA is generally detectable in upper respiratory specimens during the acute phase of infection. The lowest concentration of SARS-CoV-2  viral copies this assay can detect is 138 copies/mL. A negative result does not preclude SARS-Cov-2 infection and should not be used as the sole basis for treatment or other patient management decisions. A negative result may occur with  improper specimen collection/handling, submission of specimen other than nasopharyngeal swab, presence of viral mutation(s) within the areas targeted by this assay, and inadequate number of viral copies(<138 copies/mL). A negative result must be combined with clinical observations, patient history, and epidemiological information. The expected result is Negative.  Fact Sheet for Patients:  EntrepreneurPulse.com.au  Fact Sheet for Healthcare Providers:  IncredibleEmployment.be  This test is no t yet approved or cleared by the [place] FDA and  has been authorized for detection and/or diagnosis of SARS-CoV-2 by FDA under an Emergency Use Authorization (EUA). This EUA will remain  in effect (meaning this test can be used) for the duration of the COVID-19 declaration under Section 564(b)(1) of the Act, 21 U.S.C.section 360bbb-3(b)(1), unless the authorization is terminated  or revoked sooner.       Influenza A by PCR NEGATIVE NEGATIVE Final   Influenza B by PCR NEGATIVE NEGATIVE Final    Comment: (NOTE) The Xpert Xpress SARS-CoV-2/FLU/RSV plus assay is intended as an aid in the diagnosis of influenza from Nasopharyngeal swab specimens and should not be used as a sole basis for treatment. Nasal washings and aspirates are unacceptable for Xpert Xpress SARS-CoV-2/FLU/RSV testing.  Fact Sheet for Patients: EntrepreneurPulse.com.au  Fact Sheet for Healthcare Providers: IncredibleEmployment.be  This test is not yet approved or cleared by the [place] FDA and has been authorized for detection and/or diagnosis of SARS-CoV-2 by FDA under an Emergency Use Authorization  (EUA). This EUA will remain in effect (meaning this test can be used) for the duration of the COVID-19 declaration under Section 564(b)(1) of the Act, 21 U.S.C. section 360bbb-3(b)(1), unless the authorization is terminated or revoked.  Performed at [facility], [address] [place], [place] [address]   Blood culture (routine x 2)     Status: None (Preliminary result)   Collection Time: 06/27/21 12:43 AM   Specimen: BLOOD  Result Value Ref Range Status   Specimen Description BLOOD RIGHT FOREARM  Final   Special Requests   Final    BOTTLES DRAWN AEROBIC AND ANAEROBIC Blood Culture adequate volume   Culture   Final    NO GROWTH 3 DAYS Performed at [facility], [address], [place], [place] [address]    Report Status PENDING  Incomplete  MRSA Next Gen by PCR, Nasal     Status: None  Collection Time: 06/28/21  5:31 AM   Specimen: Nasal Mucosa; Nasal Swab  Result Value Ref Range Status   MRSA by PCR Next Gen NOT DETECTED NOT DETECTED Final    Comment: (NOTE) The GeneXpert MRSA Assay (FDA approved for NASAL specimens only), is one component of a comprehensive MRSA colonization surveillance program. It is not intended to diagnose MRSA infection nor to guide or monitor treatment for MRSA infections. Test performance is not FDA approved in patients less than 54 years old. Performed at [facility], [address] [place], [place] [address]     Radiology Studies: No results found.     Amaziah Raisanen T. [place]  If 7PM-7AM, please contact night-coverage [email or web address] 06/30/2021, 2:12 PM

## 2021-06-30 NOTE — Progress Notes (Signed)
[facility]  ROUNDING NOTE   Subjective:   Patient is now not wanting amputation. Right BKA was scheduled for today.   Code status has changed. Patient met with palliative care yesterday. Patient's daughter is flying to [place] next week.   Hemodialysis is scheduled for later today.   Objective:  Vital signs in last 24 hours:  Temp:  [97.4 F (36.3 C)-99.3 F (37.4 C)] 98.6 F (37 C) (09/16 0718) Pulse Rate:  [59-77] 61 (09/16 0718) Resp:  [16-18] 18 (09/16 0718) BP: (97-124)/(48-58) 124/55 (09/16 0718) SpO2:  [95 %-100 %] 100 % (09/16 0718) Weight:  [91.4 kg] 91.4 kg (09/16 0500)  Weight change: 2.721 kg Filed Weights   06/28/21 1540 06/29/21 0500 06/30/21 0500  Weight: 88.7 kg 92.8 kg 91.4 kg    Intake/Output: I/O last 3 completed shifts: In: 831.6 [P.O.:240; IV Piggyback:591.6] Out: -    Intake/Output this shift:  Total I/O In: 100 [IV Piggyback:100] Out: -   Physical Exam: General: NAD, resting in bed  Head: Normocephalic, atraumatic. Moist oral mucosal membranes  Eyes: Anicteric  Lungs:  Clear to auscultation, normal effort, [place] O2  Heart: Regular rate and rhythm  Abdomen:  Soft, nontender  Extremities: Right lower extremity in dressings  Neurologic: Alert to self and place, not situation. moving all four extremities  Skin: No lesions  Access: Rt groin Permcath    Basic Metabolic Panel: Recent Labs  Lab 06/27/21 0042 06/27/21 0611 06/28/21 0311 06/29/21 0536 06/30/21 0730  NA 135 136 137 135 136  136  K 5.3* 5.1 4.9 4.4 4.1  4.0  CL 93* 94* 97* 95* 98  96*  CO2 '28 30 25 27 27  27  ' GLUCOSE 330* 296* 91 159* 88  86  BUN 62* 73* 84* 53* 65*  67*  CREATININE 7.02* 7.31* 8.67* 5.91* 7.45*  7.43*  CALCIUM 8.1* 8.1* 8.3* 8.5* 8.6*  8.6*  MG  --   --   --  2.0 2.3  PHOS  --   --   --  4.9* 5.9*     Liver Function Tests: Recent Labs  Lab 06/27/21 0042 06/29/21 0536 06/30/21 0730  AST 16  --   --   ALT 14  --   --    ALKPHOS 110  --   --   BILITOT 0.6  --   --   PROT 6.6  --   --   ALBUMIN 2.4* 2.4* 2.4*    No results for input(s): LIPASE, AMYLASE in the last 168 hours. No results for input(s): AMMONIA in the last 168 hours.  CBC: Recent Labs  Lab 06/27/21 0042 06/27/21 0611 06/29/21 0536 06/30/21 0730  WBC 19.6* 19.6* 14.1* 14.5*  NEUTROABS 18.6*  --   --   --   HGB 9.1* 9.1* 8.5* 9.0*  HCT 28.3* 29.1* 25.4* 26.9*  MCV 89.3 87.9 87.0 87.6  PLT 272 267 251 268     Cardiac Enzymes: No results for input(s): CKTOTAL, CKMB, CKMBINDEX, TROPONINI in the last 168 hours.  BNP: Invalid input(s): POCBNP  CBG: Recent Labs  Lab 06/27/21 2107 06/28/21 1533 06/28/21 1745 06/29/21 2145  GLUCAP 145* 140* 144* 176*     Microbiology: Results for orders placed or performed during the hospital encounter of 06/26/21  Blood culture (routine x 2)     Status: None (Preliminary result)   Collection Time: 06/27/21 12:42 AM   Specimen: BLOOD  Result Value Ref Range Status   Specimen Description BLOOD  RIGHT ASSIST CONTROL  Final   Special Requests   Final    BOTTLES DRAWN AEROBIC AND ANAEROBIC Blood Culture adequate volume   Culture   Final    NO GROWTH 3 DAYS Performed at [facility], [address] [place], [place] [address]    Report Status PENDING  Incomplete  Resp Panel by RT-PCR (Flu A&B, Covid) Nasopharyngeal Swab     Status: None   Collection Time: 06/27/21 12:42 AM   Specimen: Nasopharyngeal Swab; Nasopharyngeal(NP) swabs in vial transport medium  Result Value Ref Range Status   SARS Coronavirus 2 by RT PCR NEGATIVE NEGATIVE Final    Comment: (NOTE) SARS-CoV-2 target nucleic acids are NOT DETECTED.  The SARS-CoV-2 RNA is generally detectable in upper respiratory specimens during the acute phase of infection. The lowest concentration of SARS-CoV-2 viral copies this assay can detect is 138 copies/mL. A negative result does not preclude SARS-Cov-2 infection and should  not be used as the sole basis for treatment or other patient management decisions. A negative result may occur with  improper specimen collection/handling, submission of specimen other than nasopharyngeal swab, presence of viral mutation(s) within the areas targeted by this assay, and inadequate number of viral copies(<138 copies/mL). A negative result must be combined with clinical observations, patient history, and epidemiological information. The expected result is Negative.  Fact Sheet for Patients:  EntrepreneurPulse.com.au  Fact Sheet for Healthcare Providers:  IncredibleEmployment.be  This test is no t yet approved or cleared by the [place] FDA and  has been authorized for detection and/or diagnosis of SARS-CoV-2 by FDA under an Emergency Use Authorization (EUA). This EUA will remain  in effect (meaning this test can be used) for the duration of the COVID-19 declaration under Section 564(b)(1) of the Act, 21 U.S.C.section 360bbb-3(b)(1), unless the authorization is terminated  or revoked sooner.       Influenza A by PCR NEGATIVE NEGATIVE Final   Influenza B by PCR NEGATIVE NEGATIVE Final    Comment: (NOTE) The Xpert Xpress SARS-CoV-2/FLU/RSV plus assay is intended as an aid in the diagnosis of influenza from Nasopharyngeal swab specimens and should not be used as a sole basis for treatment. Nasal washings and aspirates are unacceptable for Xpert Xpress SARS-CoV-2/FLU/RSV testing.  Fact Sheet for Patients: EntrepreneurPulse.com.au  Fact Sheet for Healthcare Providers: IncredibleEmployment.be  This test is not yet approved or cleared by the [place] FDA and has been authorized for detection and/or diagnosis of SARS-CoV-2 by FDA under an Emergency Use Authorization (EUA). This EUA will remain in effect (meaning this test can be used) for the duration of the COVID-19 declaration under  Section 564(b)(1) of the Act, 21 U.S.C. section 360bbb-3(b)(1), unless the authorization is terminated or revoked.  Performed at [facility], [address] [place], [place] [address]   Blood culture (routine x 2)     Status: None (Preliminary result)   Collection Time: 06/27/21 12:43 AM   Specimen: BLOOD  Result Value Ref Range Status   Specimen Description BLOOD RIGHT FOREARM  Final   Special Requests   Final    BOTTLES DRAWN AEROBIC AND ANAEROBIC Blood Culture adequate volume   Culture   Final    NO GROWTH 3 DAYS Performed at [facility], [address], [place],  [address]    Report Status PENDING  Incomplete  MRSA Next Gen by PCR, Nasal     Status: None   Collection Time: 06/28/21  5:31 AM   Specimen: Nasal Mucosa;  Nasal Swab  Result Value Ref Range Status   MRSA by PCR Next Gen NOT DETECTED NOT DETECTED Final    Comment: (NOTE) The GeneXpert MRSA Assay (FDA approved for NASAL specimens only), is one component of a comprehensive MRSA colonization surveillance program. It is not intended to diagnose MRSA infection nor to guide or monitor treatment for MRSA infections. Test performance is not FDA approved in patients less than 29 years old. Performed at [facility], [address] [place], [place] [address]     Coagulation Studies: Recent Labs    06/30/21 0730  LABPROT 16.9*  INR 1.4*     Urinalysis: No results for input(s): COLORURINE, LABSPEC, PHURINE, GLUCOSEU, HGBUR, BILIRUBINUR, KETONESUR, PROTEINUR, UROBILINOGEN, NITRITE, LEUKOCYTESUR in the last 72 hours.  Invalid input(s): APPERANCEUR    Imaging: PERIPHERAL VASCULAR CATHETERIZATION  Result Date: 06/28/2021 See surgical note for result.    Medications:    sodium chloride     ceFEPime (MAXIPIME) IV 1 g (06/30/21 1115)   metronidazole Stopped (06/30/21 0022)   vancomycin 1,000 mg (06/28/21 1131)    acetaminophen  1,000 mg Oral TID   allopurinol  100 mg  Oral Daily   amiodarone  200 mg Oral Daily   aspirin EC  81 mg Oral Daily   atorvastatin  40 mg Oral Daily   Chlorhexidine Gluconate Cloth  6 each Topical Q0600   clopidogrel  75 mg Oral Q breakfast   docusate sodium  100 mg Oral Daily   epoetin (EPOGEN/PROCRIT) injection  10,000 Units Intravenous Q M,W,F-HD   heparin injection (subcutaneous)  5,000 Units Subcutaneous Q8H   insulin detemir  17 Units Subcutaneous QHS   levETIRAcetam  500 mg Oral BID   levETIRAcetam  500 mg Oral Once per day on Mon Wed Fri   mirtazapine  7.5 mg Oral QHS   polyethylene glycol  17 g Oral Daily   senna-docusate  1 tablet Oral QHS   sertraline  25 mg Oral QHS   sevelamer carbonate  800 mg Oral TID WC   sodium chloride flush  3 mL Intravenous Q12H   sodium chloride, feeding supplement (NEPRO CARB STEADY), ondansetron **OR** ondansetron (ZOFRAN) IV, ondansetron (ZOFRAN) IV, ondansetron (ZOFRAN) IV, oxyCODONE, sodium chloride flush, traZODone  Assessment/ Plan:  [name] is a 75 y.o.  male with systolic congestive heart failure, COPD, CAD, GERD, Hepatitis C, hypertension, dyslipidemia and end stage renal disease on dialysis, who was admitted to [facility] on 06/26/2021 for Acute respiratory failure with hypoxia ([place]) [J96.01] Sepsis ([place]) [A41.9] Fever, unspecified fever cause [R50.9] Hypervolemia, unspecified hypervolemia type [E87.70] Osteomyelitis of right foot, unspecified type ([place]) [M86.9] Sepsis, due to unspecified organism, unspecified whether acute organ dysfunction present ([place]) [A41.9]  [facility] [address]/MWF/Rt groin Permcath  End stage renal disease on dialysis:  - Continue MWF schedule. Treatment for later today.    Anemia of chronic kidney disease : hemoglobin 9 - EPO with next dialysis treatment.    3. Secondary Hyperparathyroidism: phosphorus and calcium at goal.   - Sevelamer with meals.   4. Hypertension: 124/55  - not currently on any blood pressure agents.   5. Diabetes  mellitus type II with chronic kidney disease: insulin dependent. Complication of diabetic foot ulcer now with osteomyelitis.   - Appreciate vascular and podiatry  - vancomycin, metronidazole, cefepime.    LOS: 3 Jeanett Antonopoulos 9/16/202210:39 AM

## 2021-06-30 NOTE — Progress Notes (Signed)
Patient with left femoral tunneled catheter, without s/sx of infection, maintained BFR throughout course of treatment, dressing changed. Pt. Unable to reach targeted UF due to trending B/P and early termination of treatment, nephrology made aware. Patient continues with periods of confusion, redirection necessary to maintain monitoring equipment. NPO status maintained, antibiotic given.

## 2021-06-30 NOTE — Care Management Important Message (Signed)
Important Message  Patient Details  Name: Ernest [name] MRN: [id number] Date of Birth: 05-23-1946   Medicare Important Message Given:  Yes  Patient asleep at time of visit with no family in room.  Copy of Medicare IM left on counter for reference.     Dannette Barbara 06/30/2021, 1:30 PM

## 2021-06-30 NOTE — Progress Notes (Signed)
Daily Progress Note   Patient Name: Ernest [name]       Date: 06/30/2021 DOB: [birth year]  Age: 75 y.o. MRN#: [id number] Attending Physician: Mercy Riding, MD Primary Care Physician: Patient, [name] (Inactive) Admit Date: 06/26/2021  Reason for Consultation/Follow-up: Establishing goals of care  Subjective: Patient resting - only complaint is he feels a little cold - better with blankets applied. Denies pain.   Length of Stay: 3  Current Medications: Scheduled Meds:  . acetaminophen  1,000 mg Oral TID  . allopurinol  100 mg Oral Daily  . amiodarone  200 mg Oral Daily  . aspirin EC  81 mg Oral Daily  . atorvastatin  40 mg Oral Daily  . Chlorhexidine Gluconate Cloth  6 each Topical Q0600  . clopidogrel  75 mg Oral Q breakfast  . docusate sodium  100 mg Oral Daily  . epoetin (EPOGEN/PROCRIT) injection  10,000 Units Intravenous Q M,W,F-HD  . heparin injection (subcutaneous)  5,000 Units Subcutaneous Q8H  . insulin detemir  17 Units Subcutaneous QHS  . levETIRAcetam  500 mg Oral BID  . levETIRAcetam  500 mg Oral Once per day on Mon Wed Fri  . mirtazapine  7.5 mg Oral QHS  . polyethylene glycol  17 g Oral Daily  . senna-docusate  1 tablet Oral QHS  . sertraline  25 mg Oral QHS  . sevelamer carbonate  800 mg Oral TID WC  . sodium chloride flush  3 mL Intravenous Q12H    Continuous Infusions: . sodium chloride    . ceFEPime (MAXIPIME) IV 1 g (06/30/21 0927)  . metronidazole Stopped (06/30/21 0022)  . vancomycin 1,000 mg (06/28/21 1131)    PRN Meds: sodium chloride, feeding supplement (NEPRO CARB STEADY), ondansetron **OR** ondansetron (ZOFRAN) IV, ondansetron (ZOFRAN) IV, ondansetron (ZOFRAN) IV, oxyCODONE, sodium chloride flush, traZODone  Physical Exam Constitutional:      General:  He is not in acute distress. Pulmonary:     Effort: Pulmonary effort is normal.  Skin:    General: Skin is warm and dry.  Neurological:     Mental Status: He is alert.     Comments: Oriented to self, place, situation though has periods of confusion            Vital Signs: BP (!) 124/55 (BP Location: Right Arm)   Pulse 61   Temp 98.6 F (37 C)   Resp 18   Ht '5\' 9"'$  (1.753 m)   Wt 91.4 kg   SpO2 100%   BMI 29.77 kg/m  SpO2: SpO2: 100 % O2 Device: O2 Device: Nasal Cannula O2 Flow Rate: O2 Flow Rate (L/min): 2.5 L/min  Intake/output summary:  Intake/Output Summary (Last 24 hours) at 06/30/2021 1008 Last data filed at 06/30/2021 0932 Gross per 24 hour  Intake 300 ml  Output --  Net 300 ml   LBM: Last BM Date: 06/29/21 Baseline Weight: Weight: 82 kg Most recent weight: Weight: 91.4 kg       Palliative Assessment/Data: PPS 30%    Flowsheet Rows    Flowsheet Row Most Recent Value  Intake Tab   Referral Department Hospitalist  Unit at Time of Referral Med/Surg Unit  Palliative Care  Primary Diagnosis Sepsis/Infectious Disease  Date Notified 06/28/21  Palliative Care Type New Palliative care  Reason for referral Clarify Goals of Care  Date of Admission 06/26/21  Date first seen by Palliative Care 06/29/21  # of days Palliative referral response time 1 Day(s)  # of days IP prior to Palliative referral 2  Clinical Assessment   Psychosocial & Spiritual Assessment   Palliative Care Outcomes        Patient Active Problem List   Diagnosis Date Noted  . Sepsis ([place]) 06/27/2021  . Disorder of phosphorus metabolism, unspecified 02/20/2021  . Hypercalcemia 12/08/2020  . Atherosclerotic heart disease of native coronary artery without angina pectoris 11/18/2020  . Diarrhea, unspecified 11/18/2020  . Fever, unspecified 11/18/2020  . Other fluid overload 11/18/2020  . Pleural effusion on left 09/30/2019  . Acute respiratory failure with hypoxia ([place]) 09/30/2019  . Pleural  effusion 09/30/2019  . Chest pain 12/02/2018  . Vascular dementia ([place]) 10/10/2017  . Paroxysmal atrial flutter ([place]) 10/10/2017  . Altered mental status, unspecified 10/09/2017  . Other malaise 10/09/2017  . S/P CABG x 4 10/04/2017  . Cerebral embolism with cerebral infarction 09/20/2017  . NSVT (nonsustained ventricular tachycardia) ([place])   . Dyspnea 09/08/2017  . Non-ST elevation MI (NSTEMI) ([place])   . Acute on chronic combined systolic and diastolic heart failure ([place])   . End-stage renal disease on hemodialysis ([place])   . Left leg weakness   . TIA (transient ischemic attack) 10/27/2016  . Dependence on renal dialysis ([place]) 08/09/2015  . History of CVA with residual deficit 08/07/2015  . Seizure, late effect of stroke ([place]) 08/07/2015  . Diabetic peripheral neuropathy ([place]) 08/07/2015  . Unspecified protein-calorie malnutrition ([place]) 05/27/2015  . Coagulation defect, unspecified ([place]) 07/16/2014  . Type 2 diabetes mellitus with diabetic peripheral angiopathy without gangrene ([place]) 07/01/2014  . Pruritus, unspecified 05/26/2014  . Elevated prostate specific antigen (PSA) 08/03/2013  . Encounter for immunization 07/10/2013  . Obstructive sleep apnea 06/16/2013  . Hyperlipidemia associated with type 2 diabetes mellitus ([place])   . ESRD (end stage renal disease) ([place])   . Erectile dysfunction associated with type 2 diabetes mellitus ([place])   . Hypertension due to end stage renal disease caused by type 2 diabetes mellitus, on dialysis ([place])   . COPD (chronic obstructive pulmonary disease) ([place])   . Chronic combined systolic and diastolic CHF (congestive heart failure) ([place])   . Anemia in chronic kidney disease 12/27/2011  . Long term (current) use of insulin ([place]) 11/03/2009  . Gout, unspecified 02/28/2009  . Iron deficiency anemia, unspecified 10/30/2008  . Secondary hyperparathyroidism of renal origin ([place]) 10/30/2008  . Uncontrolled type 2 diabetes mellitus with end-stage renal  disease ([place]) 06/08/1984    Palliative Care Assessment & Plan   HPI: 75 y.o. male  with past medical history of ESRD on HD, CHF, COPD, CAD, DVT, GERD, gout, hep C, htn, hld, and CVA admitted on 06/26/2021 with fever and chills. The patient has a right heel decubitus ulcer for which has been getting oral antibiotics however the wound has been getting progressively worse. Patient diagnosed with sepsis secondary to R heel ulcer. MRI right ankle without contrast showed large wound overlying the posterior aspect of the calcaneus with mild subcortical bone marrow edema in the posterior calcaneus most consistent with osteomyelitis, partial-thickness tear of the Achilles tendon along the medial calcaneal insertion and plantar fasciitis. Podiatry is recommending BKA and this is tentatively planned for 9/16. PMT consulted to discuss [place].  Assessment: Patient feels comfortable today. I ask him if he recalls our conversation from yesterday - he does not. I ask him if he understands plan for hospital course - he does not. I tell him about recommendation of lower extremity amputation - he appears shocked and tells me he does not want this. We discuss that podiatry has recommended this d/t poor circulation and being unable to heal wound. We discuss that could be life threatening to not proceed with recommendations. All he tells me is "I want to keep my foot". We review conversation from yesterday regarding conversation about code status. He tells me he does not remember talking about it. We review his wishes for DNR status. I share with him that his daughter disagrees with this and has requested that he remain full code - I ask patient how he feels about this but he only tells me again "I want to keep my foot".   Tells me pain is better today. Has no complaints.  Recommendations/Plan: 9/15 I wondered if psych evaluation for capacity would be helpful as patient was able to clearly express his wishes to me - today I  am less convinced he has capacity to make decisions for himself as evidenced by conversation documented above - therefore, daughter [name] is healthcare decision maker [name] has requested we maintain full code status and she has also requested that we NOT proceed with amputation - she plans to fly to [place] next week [name] stated she did not want to discuss goals of care further until she was in town so will not attempt to call her today Continue scheduled tylenol  Code Status: Full code  Thank you for allowing the Palliative Medicine Team to assist in the care of this patient.   Total Time 20 minutes Prolonged Time Billed  no       Greater than 50%  of this time was spent counseling and coordinating care related to the above assessment and plan.  Juel Burrow, DNP, [facility] Palliative Medicine Team Team Phone # [phone]  Pager [phone]

## 2021-07-01 DIAGNOSIS — N186 End stage renal disease: Secondary | ICD-10-CM | POA: Diagnosis not present

## 2021-07-01 DIAGNOSIS — I739 Peripheral vascular disease, unspecified: Secondary | ICD-10-CM | POA: Diagnosis not present

## 2021-07-01 DIAGNOSIS — E109 Type 1 diabetes mellitus without complications: Secondary | ICD-10-CM | POA: Diagnosis not present

## 2021-07-01 DIAGNOSIS — I5042 Chronic combined systolic (congestive) and diastolic (congestive) heart failure: Secondary | ICD-10-CM | POA: Diagnosis not present

## 2021-07-01 LAB — VANCOMYCIN, RANDOM: Vancomycin Rm: 18

## 2021-07-01 NOTE — Progress Notes (Signed)
PROGRESS NOTE  Ernest [name] [id number] DOB: [birth year]   PCP: Patient, [name] (Inactive)  Patient is from: SNF  DOA: 06/26/2021 LOS: 4  Chief complaints:  Chief Complaint  Patient presents with   Fever     Brief Narrative / Interim history: 75 year old M with PMH of ESRD on HD MWF, systolic CHF, COPD, CAD, DVT, CVA, hep C, HTN, HLD, GERD and cognitive impairment brought to ED from EMS with fever, chills and worsening right heel decubitus ulcer, and admitted for severe sepsis in the setting infected right heel decubitus ulcer and right heel osteomyelitis.  Started on vancomycin, cefepime and Flagyl.  Nephrology, podiatry and vascular surgery consulted.  MRI right ankle without contrast showed large wound overlying the posterior aspect of the calcaneus with mild subcortical bone marrow edema in the posterior calcaneus most consistent with osteomyelitis, partial-thickness tear of the Achilles tendon along the medial calcaneal insertion and plantar fasciitis.  Patient angiography that showed 70% right common femoral artery stenosis.  Podiatry and vascular surgery recommended BKA but patient's daughter likes to have second opinion.  Remains on broad-spectrum antibiotics  Subjective: Seen and examined earlier this morning.  No major events overnight of this morning.  No complaints but not a great historian.  He denies chest pain, dyspnea, GI or UTI symptoms.  He denies foot pain either.  Objective: Vitals:   06/30/21 1915 06/30/21 1930 06/30/21 1945 06/30/21 2011  BP:  (!) 108/27 (!) 97/22 (!) 97/59  Pulse: 69 68 79 99  Resp: (!) 24 (!) 29  18  Temp:    98 F (36.7 C)  TempSrc:    Oral  SpO2: 97% 99% (!) 84% 97%  Weight:      Height:        Intake/Output Summary (Last 24 hours) at 07/01/2021 1442 Last data filed at 07/01/2021 0956 Gross per 24 hour  Intake 236.75 ml  Output 1345 ml  Net -1108.25 ml   Filed Weights   06/28/21 1540 06/29/21 0500 06/30/21 0500  Weight:  88.7 kg 92.8 kg 91.4 kg    Examination:  GENERAL: No apparent distress.  Nontoxic. HEENT: MMM.  Vision and hearing grossly intact.  NECK: Supple.  No apparent JVD.  RESP: 97% on 3 L.  No IWOB.  Fair aeration bilaterally. CVS:  RRR. Heart sounds normal.  ABD/GI/GU: BS+. Abd soft, NTND.  MSK/EXT:  Moves extremities.  Faint DP pulses bilaterally.  SKIN: Right heel ulceration NEURO: Awake and alert.  Oriented to self and place.  No apparent focal neuro deficit. PSYCH: Calm. Normal affect.   Procedures:  9/14-lower extremity catheterization with 70% stenosis of right common femoral artery  Microbiology summarized: COVID-19 and influenza PCR nonreactive. MRSA PCR screen negative. Blood cultures NGTD.  Assessment & Plan: Severe sepsis due to infected right heel ulcer and calcaneal osteomyelitis: POA.  Meets criteria with leukocytosis, tachypnea and lactic acidosis.  Blood cultures NGTD.  LV angiography with significant PAD as above. -Continue vancomycin, cefepime and Flagyl. -Podiatry on VVS recommended BKA but daughter, HCPOA not consenting until she gets here on 9/22. She is flying into [place].   Peripheral vascular disease: Lower extremity cauterization as above. -Continue aspirin and Plavix -Management per vascular surgery as above  ESRD on HD MWF: Bone mineral disorder -Nephrology managing-on scheduled  Chronic combined CHF-no cardiopulmonary symptoms.  TTE with LVEF of 50 to 55% (previously 45 to 50%), G2 DD.  Appears euvolemic on exam.  -Fluid management with hemodialysis. -Also on IV Lasix.  History of CAD/CABG: Stable. -Continue aspirin and atorvastatin  Controlled IDDM-2 with ESRD and neuropathy: A1c 6.2% in 2019. Recent Labs  Lab 06/27/21 2107 06/28/21 1533 06/28/21 1745 06/29/21 2145 06/30/21 2328  GLUCAP 145* 140* 144* 176* 81  -Continue current insulin regimen -Follow hemoglobin A1c  Paroxysmal atrial fibrillation: Rate controlled.  Not on anticoagulation.   On aspirin and Plavix -Continue home amiodarone.  History of CVA/vascular dementia: Awake.  Oriented to self and partial place but limited insight.  He thinks he is in the hospital because his roommates tested positive for COVID.  -Continue aspirin and Plavix as above. -Reorientation and delirium precautions.  History of seizures: Stable -Continue Keppra  Depression -Continue home Zoloft and Remeron  Constipation -Bowel regimen  Debility/physical deconditioning -PT/OT -NWB on right lower extremity.  Goal of care counseling: multiple comorbidities as above.  Poor long-term prognosis.  Still full code.  I do think patient comprehends such complaints discussion to make decision.  Patient's daughter, [facility] POA wants him to remain full code -Appreciate help by palliative care  Body mass index is 29.77 kg/m.       Pressure skin injury: POA Pressure Injury 06/26/21 Heel Right Unstageable - Full thickness tissue loss in which the base of the injury is covered by slough (yellow, tan, gray, green or brown) and/or eschar (tan, brown or black) in the wound bed. Pt has unstageable pressure injury (Active)  06/26/21 2319  Location: Heel  Location Orientation: Right  Staging: Unstageable - Full thickness tissue loss in which the base of the injury is covered by slough (yellow, tan, gray, green or brown) and/or eschar (tan, brown or black) in the wound bed.  Wound Description (Comments): Pt has unstageable pressure injury to R lateral heel, necrotic, eschar in base.  Present on Admission: Yes   DVT prophylaxis:  heparin injection 5,000 Units Start: 06/27/21 0600 SCDs Start: 06/27/21 0248  Code Status: Full code Family Communication: Patient and RN.  No family member at bedside. Level of care: Med-Surg Status is: Inpatient  Remains inpatient appropriate because:Ongoing diagnostic testing needed not appropriate for outpatient work up, IV treatments appropriate due to intensity of illness or  inability to take PO, and Inpatient level of care appropriate due to severity of illness  Dispo: The patient is from: SNF              Anticipated d/c is to: SNF              Patient currently is not medically stable to d/c.   Difficult to place patient No       Consultants:  Nephrology Podiatry Vascular surgery Palliative medicine   Sch Meds:  Scheduled Meds:  acetaminophen  1,000 mg Oral TID   allopurinol  100 mg Oral Daily   amiodarone  200 mg Oral Daily   aspirin EC  81 mg Oral Daily   atorvastatin  40 mg Oral Daily   Chlorhexidine Gluconate Cloth  6 each Topical Q0600   clopidogrel  75 mg Oral Q breakfast   docusate sodium  100 mg Oral Daily   epoetin (EPOGEN/PROCRIT) injection  10,000 Units Intravenous Q M,W,F-HD   heparin injection (subcutaneous)  5,000 Units Subcutaneous Q8H   insulin detemir  17 Units Subcutaneous QHS   levETIRAcetam  500 mg Oral BID   levETIRAcetam  500 mg Oral Once per day on Mon Wed Fri   mirtazapine  7.5 mg Oral QHS   polyethylene glycol  17 g Oral Daily  senna-docusate  1 tablet Oral QHS   sertraline  25 mg Oral QHS   sevelamer carbonate  800 mg Oral TID WC   sodium chloride flush  3 mL Intravenous Q12H   Continuous Infusions:  sodium chloride     ceFEPime (MAXIPIME) IV 1 g (07/01/21 0952)   metronidazole 500 mg (07/01/21 1053)   vancomycin Stopped (06/30/21 1858)   PRN Meds:.sodium chloride, feeding supplement (NEPRO CARB STEADY), ondansetron **OR** ondansetron (ZOFRAN) IV, oxyCODONE, sodium chloride flush, traZODone  Antimicrobials: Anti-infectives (From admission, onward)    Start     Dose/Rate Route Frequency Ordered Stop   07/01/21 1000  ceFEPIme (MAXIPIME) 1 g in sodium chloride 0.9 % 100 mL IVPB        1 g 200 mL/hr over 30 Minutes Intravenous Every 24 hours 06/30/21 1254     06/30/21 1600  vancomycin (VANCOCIN) IVPB 1000 mg/200 mL premix        1,000 mg 200 mL/hr over 60 Minutes Intravenous Every M-W-F (Hemodialysis)  06/30/21 1254     06/29/21 0800  ceFAZolin (ANCEF) IVPB 1 g/50 mL premix  Status:  Discontinued       Note to Pharmacy: To be given in specials   1 g 100 mL/hr over 30 Minutes Intravenous  Once 06/28/21 1441 06/28/21 1651   06/28/21 1513  ceFAZolin (ANCEF) 1-4 GM/50ML-% IVPB       Note to Pharmacy: Corlis Hove   : cabinet override      06/28/21 1513 06/29/21 0314   06/28/21 1200  vancomycin (VANCOCIN) IVPB 1000 mg/200 mL premix  Status:  Discontinued        1,000 mg 200 mL/hr over 60 Minutes Intravenous Every M-W-F (Hemodialysis) 06/27/21 0959 06/30/21 1254   06/27/21 1100  metroNIDAZOLE (FLAGYL) IVPB 500 mg        500 mg 100 mL/hr over 60 Minutes Intravenous Every 12 hours 06/27/21 0250 07/04/21 1059   06/27/21 1100  vancomycin (VANCOREADY) IVPB 750 mg/150 mL        750 mg 150 mL/hr over 60 Minutes Intravenous  Once 06/27/21 1000 06/27/21 1137   06/27/21 1000  ceFEPIme (MAXIPIME) 1 g in sodium chloride 0.9 % 100 mL IVPB  Status:  Discontinued        1 g 200 mL/hr over 30 Minutes Intravenous  Once 06/27/21 0250 06/27/21 0316   06/27/21 1000  ceFEPIme (MAXIPIME) 1 g in sodium chloride 0.9 % 100 mL IVPB  Status:  Discontinued        1 g 200 mL/hr over 30 Minutes Intravenous Every 24 hours 06/27/21 0316 06/30/21 1254   06/27/21 0336  vancomycin (VANCOCIN) IVPB 1000 mg/200 mL premix  Status:  Discontinued        1,000 mg 200 mL/hr over 60 Minutes Intravenous Every Dialysis 06/27/21 0337 06/27/21 1258   06/27/21 0330  vancomycin (VANCOREADY) IVPB 750 mg/150 mL  Status:  Discontinued        750 mg 150 mL/hr over 60 Minutes Intravenous  Once 06/27/21 0258 06/27/21 0959   06/27/21 0300  vancomycin (VANCOCIN) IVPB 1000 mg/200 mL premix  Status:  Discontinued        1,000 mg 200 mL/hr over 60 Minutes Intravenous  Once 06/27/21 0250 06/27/21 0258   06/27/21 0115  piperacillin-tazobactam (ZOSYN) IVPB 3.375 g        3.375 g 100 mL/hr over 30 Minutes Intravenous  Once 06/27/21 0112 06/27/21  0255   06/26/21 2330  cefTRIAXone (ROCEPHIN) 1 g in sodium chloride  0.9 % 100 mL IVPB  Status:  Discontinued        1 g 200 mL/hr over 30 Minutes Intravenous  Once 06/26/21 2321 06/27/21 0111   06/26/21 2330  vancomycin (VANCOCIN) IVPB 1000 mg/200 mL premix        1,000 mg 200 mL/hr over 60 Minutes Intravenous  Once 06/26/21 2321 06/27/21 0210        I have personally reviewed the following labs and images: CBC: Recent Labs  Lab 06/27/21 0042 06/27/21 0611 06/29/21 0536 06/30/21 0730  WBC 19.6* 19.6* 14.1* 14.5*  NEUTROABS 18.6*  --   --   --   HGB 9.1* 9.1* 8.5* 9.0*  HCT 28.3* 29.1* 25.4* 26.9*  MCV 89.3 87.9 87.0 87.6  PLT 272 267 251 268   BMP &GFR Recent Labs  Lab 06/27/21 0042 06/27/21 0611 06/28/21 0311 06/29/21 0536 06/30/21 0730  NA 135 136 137 135 136  136  K 5.3* 5.1 4.9 4.4 4.1  4.0  CL 93* 94* 97* 95* 98  96*  CO2 '28 30 25 27 27  27  '$ GLUCOSE 330* 296* 91 159* 88  86  BUN 62* 73* 84* 53* 65*  67*  CREATININE 7.02* 7.31* 8.67* 5.91* 7.45*  7.43*  CALCIUM 8.1* 8.1* 8.3* 8.5* 8.6*  8.6*  MG  --   --   --  2.0 2.3  PHOS  --   --   --  4.9* 5.9*   Estimated Creatinine Clearance: 10 mL/min (A) (by C-G formula based on SCr of 7.43 mg/dL (H)). Liver & Pancreas: Recent Labs  Lab 06/27/21 0042 06/29/21 0536 06/30/21 0730  AST 16  --   --   ALT 14  --   --   ALKPHOS 110  --   --   BILITOT 0.6  --   --   PROT 6.6  --   --   ALBUMIN 2.4* 2.4* 2.4*   No results for input(s): LIPASE, AMYLASE in the last 168 hours. No results for input(s): AMMONIA in the last 168 hours. Diabetic: No results for input(s): HGBA1C in the last 72 hours. Recent Labs  Lab 06/27/21 2107 06/28/21 1533 06/28/21 1745 06/29/21 2145 06/30/21 2328  GLUCAP 145* 140* 144* 176* 81   Cardiac Enzymes: No results for input(s): CKTOTAL, CKMB, CKMBINDEX, TROPONINI in the last 168 hours. No results for input(s): PROBNP in the last 8760 hours. Coagulation Profile: Recent Labs   Lab 06/27/21 0226 06/27/21 0611 06/30/21 0730  INR 1.4* 1.4* 1.4*   Thyroid Function Tests: Recent Labs    06/30/21 0730  TSH 1.428   Lipid Profile: No results for input(s): CHOL, HDL, LDLCALC, TRIG, CHOLHDL, LDLDIRECT in the last 72 hours. Anemia Panel: No results for input(s): VITAMINB12, FOLATE, FERRITIN, TIBC, IRON, RETICCTPCT in the last 72 hours. Urine analysis:    Component Value Date/Time   COLORURINE YELLOW 04/29/2015 1702   APPEARANCEUR CLEAR 04/29/2015 1702   LABSPEC 1.016 04/29/2015 1702   PHURINE 7.5 04/29/2015 1702   GLUCOSEU >1000 (A) 04/29/2015 1702   HGBUR SMALL (A) 04/29/2015 1702   BILIRUBINUR NEGATIVE 04/29/2015 1702   KETONESUR NEGATIVE 04/29/2015 1702   PROTEINUR >300 (A) 04/29/2015 1702   UROBILINOGEN 0.2 04/29/2015 1702   NITRITE NEGATIVE 04/29/2015 1702   LEUKOCYTESUR NEGATIVE 04/29/2015 1702   Sepsis Labs: Invalid input(s): PROCALCITONIN, [place]  Microbiology: Recent Results (from the past 240 hour(s))  Blood culture (routine x 2)     Status: None (Preliminary result)   Collection Time:  06/27/21 12:42 AM   Specimen: BLOOD  Result Value Ref Range Status   Specimen Description BLOOD RIGHT ASSIST CONTROL  Final   Special Requests   Final    BOTTLES DRAWN AEROBIC AND ANAEROBIC Blood Culture adequate volume   Culture   Final    NO GROWTH 4 DAYS Performed at [facility], [address], [place], [place] [address]    Report Status PENDING  Incomplete  Resp Panel by RT-PCR (Flu A&B, Covid) Nasopharyngeal Swab     Status: None   Collection Time: 06/27/21 12:42 AM   Specimen: Nasopharyngeal Swab; Nasopharyngeal(NP) swabs in vial transport medium  Result Value Ref Range Status   SARS Coronavirus 2 by RT PCR NEGATIVE NEGATIVE Final    Comment: (NOTE) SARS-CoV-2 target nucleic acids are NOT DETECTED.  The SARS-CoV-2 RNA is generally detectable in upper respiratory specimens during the acute phase of infection. The  lowest concentration of SARS-CoV-2 viral copies this assay can detect is 138 copies/mL. A negative result does not preclude SARS-Cov-2 infection and should not be used as the sole basis for treatment or other patient management decisions. A negative result may occur with  improper specimen collection/handling, submission of specimen other than nasopharyngeal swab, presence of viral mutation(s) within the areas targeted by this assay, and inadequate number of viral copies(<138 copies/mL). A negative result must be combined with clinical observations, patient history, and epidemiological information. The expected result is Negative.  Fact Sheet for Patients:  EntrepreneurPulse.com.au  Fact Sheet for Healthcare Providers:  IncredibleEmployment.be  This test is no t yet approved or cleared by the [place] FDA and  has been authorized for detection and/or diagnosis of SARS-CoV-2 by FDA under an Emergency Use Authorization (EUA). This EUA will remain  in effect (meaning this test can be used) for the duration of the COVID-19 declaration under Section 564(b)(1) of the Act, 21 U.S.C.section 360bbb-3(b)(1), unless the authorization is terminated  or revoked sooner.       Influenza A by PCR NEGATIVE NEGATIVE Final   Influenza B by PCR NEGATIVE NEGATIVE Final    Comment: (NOTE) The Xpert Xpress SARS-CoV-2/FLU/RSV plus assay is intended as an aid in the diagnosis of influenza from Nasopharyngeal swab specimens and should not be used as a sole basis for treatment. Nasal washings and aspirates are unacceptable for Xpert Xpress SARS-CoV-2/FLU/RSV testing.  Fact Sheet for Patients: EntrepreneurPulse.com.au  Fact Sheet for Healthcare Providers: IncredibleEmployment.be  This test is not yet approved or cleared by the [place] FDA and has been authorized for detection and/or diagnosis of SARS-CoV-2 by FDA under  an Emergency Use Authorization (EUA). This EUA will remain in effect (meaning this test can be used) for the duration of the COVID-19 declaration under Section 564(b)(1) of the Act, 21 U.S.C. section 360bbb-3(b)(1), unless the authorization is terminated or revoked.  Performed at [facility], [address] [place], [place] [address]   Blood culture (routine x 2)     Status: None (Preliminary result)   Collection Time: 06/27/21 12:43 AM   Specimen: BLOOD  Result Value Ref Range Status   Specimen Description BLOOD RIGHT FOREARM  Final   Special Requests   Final    BOTTLES DRAWN AEROBIC AND ANAEROBIC Blood Culture adequate volume   Culture   Final    NO GROWTH 4 DAYS Performed at [facility], [address] [place], [place] [address]    Report Status PENDING  Incomplete  MRSA Next Gen by PCR, Nasal  Status: None   Collection Time: 06/28/21  5:31 AM   Specimen: Nasal Mucosa; Nasal Swab  Result Value Ref Range Status   MRSA by PCR Next Gen NOT DETECTED NOT DETECTED Final    Comment: (NOTE) The GeneXpert MRSA Assay (FDA approved for NASAL specimens only), is one component of a comprehensive MRSA colonization surveillance program. It is not intended to diagnose MRSA infection nor to guide or monitor treatment for MRSA infections. Test performance is not FDA approved in patients less than 72 years old. Performed at [facility], [address], [place], [place] [address]     Radiology Studies: No results found.     Trudee Chirino T. [place]  If 7PM-7AM, please contact night-coverage [email or web address] 07/01/2021, 2:42 PM

## 2021-07-01 NOTE — Consult Note (Signed)
Pharmacy Antibiotic Note  Ernest [name] is a 75 y.o. male admitted on 06/26/2021 with sepsis.  Pharmacy has been consulted for vancomycin and cefepime dosing. Patient with history of ESRD on HD (MWF). Patient has had a pressure ulcer on the right heel for which she has been receiving oral antibiotics.  The wound has been getting progressively worse and now he has a fever.  Plan: Continue Cefepime 1 gram Q24H Continue Vancomycin 1000 mg qHD session -- follow up for inpatient regimen Goal pre-HD random: 15-25 Vancomycin random level ordered for 9/17 Pt daughter not consenting to move forward with R BKA at this time. Plans to come to hospital 9/22 to make decision - f/u plans to help assess abx LOT  09/17:  Vanc random @ 0535 = 18 mcg/mL  Will continue pt on current vanc dosing.    Height: '5\' 9"'$  (175.3 cm) Weight: 91.4 kg (201 lb 9.6 oz) IBW/kg (Calculated) : 70.7  Temp (24hrs), Avg:98.2 F (36.8 C), Min:98 F (36.7 C), Max:98.6 F (37 C)  Recent Labs  Lab 06/27/21 0042 06/27/21 0226 06/27/21 0611 06/28/21 0311 06/29/21 0536 06/30/21 0730 07/01/21 0535  WBC 19.6*  --  19.6*  --  14.1* 14.5*  --   CREATININE 7.02*  --  7.31* 8.67* 5.91* 7.45*  7.43*  --   LATICACIDVEN 3.1* 2.9*  --   --   --   --   --   VANCORANDOM  --   --   --   --   --   --  18     Estimated Creatinine Clearance: 10 mL/min (A) (by C-G formula based on SCr of 7.43 mg/dL (H)).    [name]  Antimicrobials this admission: 9/13 Ceftriaxone >> x 1 9/13 Zosyn >> x 1 9/13 Vancomycin >> 9/13 Metronidazole>> 9/13 Cefepime>>   Microbiology results: 9/13 BCx: NGTD 9/14 MRSA PCR: negative  Thank you for allowing pharmacy to be a part of this patient's care.  Orene Desanctis, PharmD Clinical Pharmacist   07/01/2021 6:21 AM

## 2021-07-01 NOTE — Evaluation (Signed)
Physical Therapy Evaluation Patient Details Name: Ernest [name] MRN: [id number] DOB: Nov 10, 1945 Today's Date: 07/01/2021  History of Present Illness  75 y.o. male admitted on 06/26/2021 with sepsis.  Pharmacy has been consulted for vancomycin and cefepime dosing. Patient with history of ESRD on HD (MWF). Patient has had a pressure ulcer on the right heel for which she has been receiving oral antibiotics.  The wound has been getting progressively worse and now he has a fever   Clinical Impression  Patient reclining in bed upon arrival. Oriented to person and [hospital] setting but states he is in [facility]. Is mildly lethargic and has flat affect. Has difficulty pronouncing words clear enough to understand. Is agreeable to work with PT. Patient lives in long term care at [facility] facility and has been dependent on caregivers for help with ADLs, IADLs, and mobility. Patient states it has been several weeks since he has been able to stand or walk but documentation review shows that about 7 months ago that he was walking short distances with RW.  Upon PT evaluation, patient required mod A to total A for bed mobility. He was able to sit at edge of bed for approx 10 min with evidence of fatigue affecting trunk control and requesting to return to bed at that point. Attempts to transfer were unsuccessful due to lack of ability of patient to understand and comply with instructions not to weight bear through R LE.  Reccomend +2 at next attempt so one person can attend to R LE to prevent weight bearing through it. Patient appears to have experienced a decrease in function over the last few months and would benefit from short term rehab to improve function and decrease caregiver burden. Patient would benefit from skilled physical therapy to address impairments and functional limitations (see PT Problem List below) to work towards stated goals and return to PLOF or maximal functional independence.         Recommendations for follow up therapy are one component of a multi-disciplinary discharge planning process, led by the attending physician.  Recommendations may be updated based on patient status, additional functional criteria and insurance authorization.  Follow Up Recommendations SNF;Supervision/Assistance - 24 hour    Equipment Recommendations  Other (comment) (to be determined in next venue of care)    Recommendations for Other Services OT consult     Precautions / Restrictions Precautions Precautions: Fall Restrictions Weight Bearing Restrictions: Yes RLE Weight Bearing: Non weight bearing Other Position/Activity Restrictions: NWB per secure chart with podiatry      Mobility  Bed Mobility Overal bed mobility: Needs Assistance Bed Mobility: Rolling;Supine to Sit;Sit to Supine Rolling: Mod assist   Supine to sit: Mod assist Sit to supine: Max assist   General bed mobility comments: Pateint able to roll to left side with mod A and followed commands well during roll.  Needed mod A for supine to sit with assistance at B LE and trunk. REquired max A for sit to supine with assistance at B LE and trunk. Total assist for boost up bed. Needed increased time to follow commands during supine <> sit.    Transfers                 General transfer comment: transfer attempt limited by patient's cognition and inability to avoid attempting to put weight through R LE. Unable to transfer safely at this time.  Ambulation/Gait             General Gait Details:  unable to attempt ambulation at this time.  Stairs            Wheelchair Mobility    Modified Rankin (Stroke Patients Only)       Balance Overall balance assessment: Needs assistance Sitting-balance support: Bilateral upper extremity supported (L supported) Sitting balance-Leahy Scale: Fair Sitting balance - Comments: needed extra time to gain trunk control upon sitting and  fatigued quickly, requesting  to lay back down and showing signs of loss of trunk control after about 5-10 min sitting. Postural control: Posterior lean   Standing balance-Leahy Scale: Zero                               Pertinent Vitals/Pain Pain Assessment: Faces Faces Pain Scale: Hurts a little bit Pain Location: right LE with with bed contact Pain Intervention(s): Limited activity within patient's tolerance;Monitored during session;Repositioned    Home Living Family/patient expects to be discharged to:: Skilled nursing facility                      Prior Function Level of Independence: Needs assistance   Gait / Transfers Assistance Needed: Pt reports he scoots onto electric scooter at SNF. He says he has not stood or taken steps in several weeks. Per chart review, 7 months ago, he was walking short distances with RW.  ADL's / Homemaking Assistance Needed: Required assist for bathing, dressing, and IADLs.        Hand Dominance   Dominant Hand: Right    Extremity/Trunk Assessment   Upper Extremity Assessment Upper Extremity Assessment: Generalized weakness (Patient unable to elevate shoulders above ~80 degrees abduction)    Lower Extremity Assessment Lower Extremity Assessment: RLE deficits/detail;LLE deficits/detail RLE Deficits / Details: Unable to fully assess due to weight bearing precautions. LLE Deficits / Details: MMT L knee extension 4/5    Cervical / Trunk Assessment Cervical / Trunk Assessment: Normal  Communication   Communication: Expressive difficulties;HOH  Cognition Arousal/Alertness: Lethargic Behavior During Therapy: Flat affect Overall Cognitive Status: No family/caregiver present to determine baseline cognitive functioning                                 General Comments: Pt requires increased time to process information. Pt oriented to self, situation, and month. He states he is at [employer] cone.      General Comments General comments (skin  integrity, edema, etc.): SpO2 and HR remained WFL with 2L/min throughout session.    Exercises Other Exercises Other Exercises: Seated trunk control exercise with alternating hand touches across body, 1x10 each side.   Assessment/Plan    PT Assessment Patient needs continued PT services  PT Problem List Decreased strength;Decreased cognition;Decreased activity tolerance;Decreased knowledge of use of DME;Decreased balance;Decreased safety awareness;Decreased mobility;Decreased knowledge of precautions;Decreased skin integrity       PT Treatment Interventions DME instruction;Balance training;Gait training;Neuromuscular re-education;Stair training;Cognitive remediation;Functional mobility training;Patient/family education;Therapeutic activities;Therapeutic exercise    PT Goals (Current goals can be found in the Care Plan section)  Acute Rehab PT Goals Patient Stated Goal: to rest PT Goal Formulation: With patient Time For Goal Achievement: 07/03/21 Potential to Achieve Goals: Good    Frequency Min 2X/week   Barriers to discharge   Patient dependent for care and mobility currently.    Co-evaluation  AM-PAC PT "6 Clicks" Mobility  Outcome Measure Help needed turning from your back to your side while in a flat bed without using bedrails?: A Little Help needed moving from lying on your back to sitting on the side of a flat bed without using bedrails?: A Lot Help needed moving to and from a bed to a chair (including a wheelchair)?: Total Help needed standing up from a chair using your arms (e.g., wheelchair or bedside chair)?: Total Help needed to walk in hospital room?: Total Help needed climbing 3-5 steps with a railing? : Total 6 Click Score: 9    End of Session Equipment Utilized During Treatment: Gait belt Activity Tolerance: Patient limited by fatigue (limited by cognition) Patient left: in bed;with bed alarm set;with SCD's reapplied;with nursing/sitter in  room;with call bell/phone within reach   PT Visit Diagnosis: Muscle weakness (generalized) (M62.81);Difficulty in walking, not elsewhere classified (R26.2);Other abnormalities of gait and mobility (R26.89);Unsteadiness on feet (R26.81)    Time: 1600-1620 PT Time Calculation (min) (ACUTE ONLY): 20 min   Charges:   PT Evaluation $PT Eval Low Complexity: 1 Low          Bertina Guthridge R. Graylon Good, PT, DPT 07/01/21, 4:46 PM

## 2021-07-01 NOTE — Progress Notes (Signed)
[facility]  ROUNDING NOTE   Subjective:  Patient found resting in bed, able to answer simple question appropriately and participate in conversation.No acute distress noted.   Objective:  Vital signs in last 24 hours:  Temp:  [98 F (36.7 C)-98.1 F (36.7 C)] 98 F (36.7 C) (09/16 2011) Pulse Rate:  [42-99] 99 (09/16 2011) Resp:  [11-29] 18 (09/16 2011) BP: (97-239)/(22-207) 97/59 (09/16 2011) SpO2:  [84 %-100 %] 97 % (09/16 2011)  Weight change:  Filed Weights   06/28/21 1540 06/29/21 0500 06/30/21 0500  Weight: 88.7 kg 92.8 kg 91.4 kg    Intake/Output: I/O last 3 completed shifts: In: 236.8 [IV Piggyback:236.8] Out: [id number] [Other:1345]   Intake/Output this shift:  Total I/O In: 100 [IV Piggyback:100] Out: -   Physical Exam: General: Resting in bed  Head: Moist oral mucosal membranes  Eyes: Anicteric  Lungs:  Respiration slightly labored with exertion, symmetrical, lungs clear,on 2L O2 via [place]  Heart: S1S2, no rubs or gallops  Abdomen:  Soft, nontender,non distended  Extremities: Right lower extremity in dressings  Neurologic: Awake, alert, able to answer simple questions appropriately  Skin: No lesions  Access: Rt groin Permcath    Basic Metabolic Panel: Recent Labs  Lab 06/27/21 0042 06/27/21 0611 06/28/21 0311 06/29/21 0536 06/30/21 0730  NA 135 136 137 135 136  136  K 5.3* 5.1 4.9 4.4 4.1  4.0  CL 93* 94* 97* 95* 98  96*  CO2 '28 30 25 27 27  27  '$ GLUCOSE 330* 296* 91 159* 88  86  BUN 62* 73* 84* 53* 65*  67*  CREATININE 7.02* 7.31* 8.67* 5.91* 7.45*  7.43*  CALCIUM 8.1* 8.1* 8.3* 8.5* 8.6*  8.6*  MG  --   --   --  2.0 2.3  PHOS  --   --   --  4.9* 5.9*     Liver Function Tests: Recent Labs  Lab 06/27/21 0042 06/29/21 0536 06/30/21 0730  AST 16  --   --   ALT 14  --   --   ALKPHOS 110  --   --   BILITOT 0.6  --   --   PROT 6.6  --   --   ALBUMIN 2.4* 2.4* 2.4*    No results for input(s): LIPASE, AMYLASE in the last  168 hours. No results for input(s): AMMONIA in the last 168 hours.  CBC: Recent Labs  Lab 06/27/21 0042 06/27/21 0611 06/29/21 0536 06/30/21 0730  WBC 19.6* 19.6* 14.1* 14.5*  NEUTROABS 18.6*  --   --   --   HGB 9.1* 9.1* 8.5* 9.0*  HCT 28.3* 29.1* 25.4* 26.9*  MCV 89.3 87.9 87.0 87.6  PLT 272 267 251 268     Cardiac Enzymes: No results for input(s): CKTOTAL, CKMB, CKMBINDEX, TROPONINI in the last 168 hours.  BNP: Invalid input(s): POCBNP  CBG: Recent Labs  Lab 06/27/21 2107 06/28/21 1533 06/28/21 1745 06/29/21 2145 06/30/21 2328  GLUCAP 145* 140* 144* 176* 41     Microbiology: Results for orders placed or performed during the hospital encounter of 06/26/21  Blood culture (routine x 2)     Status: None (Preliminary result)   Collection Time: 06/27/21 12:42 AM   Specimen: BLOOD  Result Value Ref Range Status   Specimen Description BLOOD RIGHT ASSIST CONTROL  Final   Special Requests   Final    BOTTLES DRAWN AEROBIC AND ANAEROBIC Blood Culture adequate volume   Culture   Final  NO GROWTH 4 DAYS Performed at [facility], [address] [place], [place] [address]    Report Status PENDING  Incomplete  Resp Panel by RT-PCR (Flu A&B, Covid) Nasopharyngeal Swab     Status: None   Collection Time: 06/27/21 12:42 AM   Specimen: Nasopharyngeal Swab; Nasopharyngeal(NP) swabs in vial transport medium  Result Value Ref Range Status   SARS Coronavirus 2 by RT PCR NEGATIVE NEGATIVE Final    Comment: (NOTE) SARS-CoV-2 target nucleic acids are NOT DETECTED.  The SARS-CoV-2 RNA is generally detectable in upper respiratory specimens during the acute phase of infection. The lowest concentration of SARS-CoV-2 viral copies this assay can detect is 138 copies/mL. A negative result does not preclude SARS-Cov-2 infection and should not be used as the sole basis for treatment or other patient management decisions. A negative result may occur with  improper specimen  collection/handling, submission of specimen other than nasopharyngeal swab, presence of viral mutation(s) within the areas targeted by this assay, and inadequate number of viral copies(<138 copies/mL). A negative result must be combined with clinical observations, patient history, and epidemiological information. The expected result is Negative.  Fact Sheet for Patients:  EntrepreneurPulse.com.au  Fact Sheet for Healthcare Providers:  IncredibleEmployment.be  This test is no t yet approved or cleared by the [place] FDA and  has been authorized for detection and/or diagnosis of SARS-CoV-2 by FDA under an Emergency Use Authorization (EUA). This EUA will remain  in effect (meaning this test can be used) for the duration of the COVID-19 declaration under Section 564(b)(1) of the Act, 21 U.S.C.section 360bbb-3(b)(1), unless the authorization is terminated  or revoked sooner.       Influenza A by PCR NEGATIVE NEGATIVE Final   Influenza B by PCR NEGATIVE NEGATIVE Final    Comment: (NOTE) The Xpert Xpress SARS-CoV-2/FLU/RSV plus assay is intended as an aid in the diagnosis of influenza from Nasopharyngeal swab specimens and should not be used as a sole basis for treatment. Nasal washings and aspirates are unacceptable for Xpert Xpress SARS-CoV-2/FLU/RSV testing.  Fact Sheet for Patients: EntrepreneurPulse.com.au  Fact Sheet for Healthcare Providers: IncredibleEmployment.be  This test is not yet approved or cleared by the [place] FDA and has been authorized for detection and/or diagnosis of SARS-CoV-2 by FDA under an Emergency Use Authorization (EUA). This EUA will remain in effect (meaning this test can be used) for the duration of the COVID-19 declaration under Section 564(b)(1) of the Act, 21 U.S.C. section 360bbb-3(b)(1), unless the authorization is terminated or revoked.  Performed at [facility], [address] [place], [place] [address]   Blood culture (routine x 2)     Status: None (Preliminary result)   Collection Time: 06/27/21 12:43 AM   Specimen: BLOOD  Result Value Ref Range Status   Specimen Description BLOOD RIGHT FOREARM  Final   Special Requests   Final    BOTTLES DRAWN AEROBIC AND ANAEROBIC Blood Culture adequate volume   Culture   Final    NO GROWTH 4 DAYS Performed at [facility], [address] [place], [place] [address]    Report Status PENDING  Incomplete  MRSA Next Gen by PCR, Nasal     Status: None   Collection Time: 06/28/21  5:31 AM   Specimen: Nasal Mucosa; Nasal Swab  Result Value Ref Range Status   MRSA by PCR Next Gen NOT DETECTED NOT DETECTED Final    Comment: (NOTE) The GeneXpert MRSA Assay (FDA approved for NASAL  specimens only), is one component of a comprehensive MRSA colonization surveillance program. It is not intended to diagnose MRSA infection nor to guide or monitor treatment for MRSA infections. Test performance is not FDA approved in patients less than 74 years old. Performed at [facility], [address] [place], [place] [address]     Coagulation Studies: Recent Labs    06/30/21 0730  LABPROT 16.9*  INR 1.4*     Urinalysis: No results for input(s): COLORURINE, LABSPEC, PHURINE, GLUCOSEU, HGBUR, BILIRUBINUR, KETONESUR, PROTEINUR, UROBILINOGEN, NITRITE, LEUKOCYTESUR in the last 72 hours.  Invalid input(s): APPERANCEUR    Imaging: No results found.   Medications:    sodium chloride     ceFEPime (MAXIPIME) IV 1 g (07/01/21 [id number])   metronidazole Stopped (07/01/21 0020)   vancomycin Stopped (06/30/21 1858)    acetaminophen  1,000 mg Oral TID   allopurinol  100 mg Oral Daily   amiodarone  200 mg Oral Daily   aspirin EC  81 mg Oral Daily   atorvastatin  40 mg Oral Daily   Chlorhexidine Gluconate Cloth  6 each Topical Q0600   clopidogrel  75 mg Oral Q breakfast   docusate  sodium  100 mg Oral Daily   epoetin (EPOGEN/PROCRIT) injection  10,000 Units Intravenous Q M,W,F-HD   heparin injection (subcutaneous)  5,000 Units Subcutaneous Q8H   insulin detemir  17 Units Subcutaneous QHS   levETIRAcetam  500 mg Oral BID   levETIRAcetam  500 mg Oral Once per day on Mon Wed Fri   mirtazapine  7.5 mg Oral QHS   polyethylene glycol  17 g Oral Daily   senna-docusate  1 tablet Oral QHS   sertraline  25 mg Oral QHS   sevelamer carbonate  800 mg Oral TID WC   sodium chloride flush  3 mL Intravenous Q12H   sodium chloride, feeding supplement (NEPRO CARB STEADY), ondansetron **OR** ondansetron (ZOFRAN) IV, oxyCODONE, sodium chloride flush, traZODone  Assessment/ Plan:  [name] is a 75 y.o.  male with systolic congestive heart failure, COPD, CAD, GERD, Hepatitis C, hypertension, dyslipidemia and end stage renal disease on dialysis, who was admitted to [facility] on 06/26/2021 for Acute respiratory failure with hypoxia ([place]) [J96.01] Sepsis ([place]) [A41.9] Fever, unspecified fever cause [R50.9] Hypervolemia, unspecified hypervolemia type [E87.70] Osteomyelitis of right foot, unspecified type ([place]) [M86.9] Sepsis, due to unspecified organism, unspecified whether acute organ dysfunction present ([place]) [A41.9]  [facility] [address]/MWF/Rt groin Permcath  End stage renal disease on dialysis:  Received HD yesterday, but unable to obtain targeted UF due to hypotension and early termination of treatment.UF obtained was 1,345 ml.   Anemia of chronic kidney disease  Receiving Epogen 10,000 units with dialysis treatments  Hgb 9.0   3. Secondary Hyperparathyroidism:  Will continue Sevelamer 800 mg TID with meals  4. Hypertension Blood pressure readings within low normal range, not on any antihypertensives currently   5. Diabetes mellitus type II with chronic kidney disease: insulin dependent.  Complication of diabetic foot ulcer now with osteomyelitis.  Refused Rt BKA  which was scheduled for 06/30/2021 Awaiting daughter to come next week to make decision about surgical intervention Diabetic management per primary team.   LOS: 4 Jayelyn Barno 9/17/202210:35 AM

## 2021-07-01 NOTE — Evaluation (Signed)
Occupational Therapy Evaluation Patient Details Name: Ernest [name] MRN: [id number] DOB: 11/15/1945 Today's Date: 07/01/2021   History of Present Illness 75 y.o. male admitted on 06/26/2021 with sepsis.  Pharmacy has been consulted for vancomycin and cefepime dosing. Patient with history of ESRD on HD (MWF). Patient has had a pressure ulcer on the right heel for which she has been receiving oral antibiotics.  The wound has been getting progressively worse and now he has a fever   Clinical Impression   Patient presenting with decreased Ind in self care, balance, functional mobility/transfer, endurance, and safety awareness.Patient reports being a long term care resident at a SNF. Staff assist him with self care tasks and he reports lateral scoot onto electric scooter for mobility. Pt reports he has not stood in several weeks.Per chart review, 7 months ago, he was ambulating short distances with RW. Patient lethargic during session needing cuing throughout for alertness. PT needing max A for repositioning with food tray in front of him. He did take several bites but needing assist to get utensils from utensil sleeve. Pt refused OOB/EOB activities this session. Patient will benefit from acute OT to increase overall independence in the areas of ADLs, functional mobility, and safety awareness in order to safely discharge to next venue of care.     Recommendations for follow up therapy are one component of a multi-disciplinary discharge planning process, led by the attending physician.  Recommendations may be updated based on patient status, additional functional criteria and insurance authorization.   Follow Up Recommendations  SNF;Supervision/Assistance - 24 hour    Equipment Recommendations  Other (comment) (defer to next venue of care)       Precautions / Restrictions Precautions Precautions: Fall Restrictions Weight Bearing Restrictions: Yes RLE Weight Bearing: Non weight bearing Other  Position/Activity Restrictions: NWB per secure chart with podiatry      Mobility Bed Mobility Overal bed mobility: Needs Assistance Bed Mobility: Rolling Rolling: Max assist              Transfers                 General transfer comment: Pt refusal for EOB and OOB        ADL either performed or assessed with clinical judgement   ADL Overall ADL's : Needs assistance/impaired                                       General ADL Comments: mod A UB self care and max- total for LB self care. Pt was feeding self but needed total set up of meal tray. Pt unable to get utensils from utensils sleeve without assist.     Vision Patient Visual Report: No change from baseline              Pertinent Vitals/Pain Pain Assessment: Faces Faces Pain Scale: No hurt     Hand Dominance Right   Extremity/Trunk Assessment Upper Extremity Assessment Upper Extremity Assessment: Generalized weakness   Lower Extremity Assessment Lower Extremity Assessment: Defer to PT evaluation       Communication Communication Communication: Expressive difficulties;HOH   Cognition Arousal/Alertness: Lethargic Behavior During Therapy: Flat affect Overall Cognitive Status: No family/caregiver present to determine baseline cognitive functioning  General Comments: Pt requires increased time to process information. Pt oriented to self, situation, and month. He believed he was at [employer] cone in [place].              Home Living Family/patient expects to be discharged to:: Skilled nursing facility                                        Prior Functioning/Environment Level of Independence: Needs assistance  Gait / Transfers Assistance Needed: Pt reports he scoots onto electric scooter at SNF. He says he has not stood or taking steps in several weeks. Per chart review, 7 months ago, he was walking short distances  with RW. ADL's / Homemaking Assistance Needed: Required assist for bathing, dressing, and IADLs.            OT Problem List: Decreased strength;Decreased range of motion;Decreased activity tolerance;Impaired balance (sitting and/or standing);Decreased safety awareness;Decreased knowledge of use of DME or AE;Decreased cognition;Decreased knowledge of precautions;Pain      OT Treatment/Interventions: Self-care/ADL training;Therapeutic exercise;Therapeutic activities;Cognitive remediation/compensation;Energy conservation;Patient/family education;DME and/or AE instruction;Balance training    OT Goals(Current goals can be found in the care plan section) Acute Rehab OT Goals Patient Stated Goal: to rest OT Goal Formulation: With patient Time For Goal Achievement: 07/15/21 Potential to Achieve Goals: Good ADL Goals Pt Will Perform Eating: (P) with modified independence Pt Will Perform Grooming: (P) with modified independence Pt Will Perform Upper Body Bathing: (P) with set-up Pt Will Transfer to Toilet: (P) with mod assist;bedside commode  OT Frequency: Min 1X/week   Barriers to D/C:    pt lives in SNF and will likely return          AM-PAC OT "6 Clicks" Daily Activity     Outcome Measure Help from another person eating meals?: A Little Help from another person taking care of personal grooming?: A Little Help from another person toileting, which includes using toliet, bedpan, or urinal?: Total   Help from another person to put on and taking off regular upper body clothing?: A Lot Help from another person to put on and taking off regular lower body clothing?: Total 6 Click Score: 10   End of Session Nurse Communication: Mobility status  Activity Tolerance: Patient limited by lethargy Patient left: in bed;with call bell/phone within reach;with bed alarm set  OT Visit Diagnosis: Unsteadiness on feet (R26.81);Muscle weakness (generalized) (M62.81)                Time:  1350-1410 OT Time Calculation (min): 20 min Charges:  OT General Charges $OT Visit: 1 Visit OT Evaluation $OT Eval Moderate Complexity: 1 Mod OT Treatments $Self Care/Home Management : 8-22 mins  Darleen Crocker, MS, OTR/L , CBIS ascom [phone]  07/01/21, 2:41 PM

## 2021-07-02 DIAGNOSIS — I739 Peripheral vascular disease, unspecified: Secondary | ICD-10-CM | POA: Diagnosis not present

## 2021-07-02 DIAGNOSIS — I5042 Chronic combined systolic (congestive) and diastolic (congestive) heart failure: Secondary | ICD-10-CM | POA: Diagnosis not present

## 2021-07-02 DIAGNOSIS — N186 End stage renal disease: Secondary | ICD-10-CM | POA: Diagnosis not present

## 2021-07-02 DIAGNOSIS — E109 Type 1 diabetes mellitus without complications: Secondary | ICD-10-CM | POA: Diagnosis not present

## 2021-07-02 LAB — TYPE AND SCREEN
ABO/RH(D): B POS
Antibody Screen: NEGATIVE
Unit division: 0
Unit division: 0
Unit division: 0
Unit division: 0

## 2021-07-02 LAB — CULTURE, BLOOD (ROUTINE X 2)
Culture: NO GROWTH
Culture: NO GROWTH
Special Requests: ADEQUATE
Special Requests: ADEQUATE

## 2021-07-02 LAB — BPAM RBC
Blood Product Expiration Date: 202210182359
Blood Product Expiration Date: 202210182359
Blood Product Expiration Date: 202210192359
Blood Product Expiration Date: 202210192359
Unit Type and Rh: 5100
Unit Type and Rh: 5100
Unit Type and Rh: 5100
Unit Type and Rh: 5100

## 2021-07-02 LAB — C-REACTIVE PROTEIN: CRP: 26.3 mg/dL — ABNORMAL HIGH (ref <1.0)

## 2021-07-02 LAB — GLUCOSE, CAPILLARY: Glucose-Capillary: 320 mg/dL — ABNORMAL HIGH (ref 70–99)

## 2021-07-02 LAB — PREPARE RBC (CROSSMATCH)

## 2021-07-02 LAB — SEDIMENTATION RATE: Sed Rate: 108 mm/hr — ABNORMAL HIGH (ref 0–20)

## 2021-07-02 MED ORDER — INSULIN ASPART 100 UNIT/ML IJ SOLN: 0.0000 [IU] | Freq: Three times a day (TID) | INTRAMUSCULAR | Status: DC

## 2021-07-02 MED ORDER — POLYETHYLENE GLYCOL 3350 17 G PO PACK
17.0000 g | PACK | Freq: Two times a day (BID) | ORAL | Status: DC | PRN
  Administered 2021-07-18: 17 g via ORAL
  Filled 2021-07-02: qty 1

## 2021-07-02 MED ORDER — SENNOSIDES-DOCUSATE SODIUM 8.6-50 MG PO TABS: 1.0000 | ORAL_TABLET | Freq: Two times a day (BID) | ORAL | Status: DC | PRN

## 2021-07-02 MED ORDER — INSULIN ASPART 100 UNIT/ML IJ SOLN
0.0000 [IU] | Freq: Every day | INTRAMUSCULAR | Status: DC
  Administered 2021-07-02: 3 [IU] via SUBCUTANEOUS
  Filled 2021-07-02: qty 1

## 2021-07-02 NOTE — Progress Notes (Addendum)
[facility]  ROUNDING NOTE   Subjective:  Patient resting in bed, awake, alert, in no acute distress. He is on supplemental O2, 2L via nasal cannula, no SOB, anorexia, nausea or vomiting.   Objective:  Vital signs in last 24 hours:  Temp:  [98.6 F (37 C)-98.9 F (37.2 C)] 98.6 F (37 C) (09/18 0500) Pulse Rate:  [68-78] 68 (09/18 0854) Resp:  [16-20] 20 (09/18 0854) BP: (117-142)/(43-56) 117/53 (09/18 0854) SpO2:  [94 %-100 %] 94 % (09/18 0854)  Weight change:  Filed Weights   06/28/21 1540 06/29/21 0500 06/30/21 0500  Weight: 88.7 kg 92.8 kg 91.4 kg    Intake/Output: I/O last 3 completed shifts: In: 300 [IV Piggyback:300] Out: -    Intake/Output this shift:  No intake/output data recorded.  Physical Exam: General: In no acute distress  Head: Normocephalic, atraumatic  Eyes: Anicteric  Lungs:  Lungs clear,on 2L O2 via   Heart: Regular rate and rhythm  Abdomen:  Soft, nontender,non distended  Extremities: Dressings intact on Rt.Leg  Neurologic: Oriented to self and place  Skin: No acute rashes or lesions  Access: Rt groin Permcath    Basic Metabolic Panel: Recent Labs  Lab 06/27/21 0042 06/27/21 0611 06/28/21 0311 06/29/21 0536 06/30/21 0730  NA 135 136 137 135 136  136  K 5.3* 5.1 4.9 4.4 4.1  4.0  CL 93* 94* 97* 95* 98  96*  CO2 '28 30 25 27 27  27  '$ GLUCOSE 330* 296* 91 159* 88  86  BUN 62* 73* 84* 53* 65*  67*  CREATININE 7.02* 7.31* 8.67* 5.91* 7.45*  7.43*  CALCIUM 8.1* 8.1* 8.3* 8.5* 8.6*  8.6*  MG  --   --   --  2.0 2.3  PHOS  --   --   --  4.9* 5.9*     Liver Function Tests: Recent Labs  Lab 06/27/21 0042 06/29/21 0536 06/30/21 0730  AST 16  --   --   ALT 14  --   --   ALKPHOS 110  --   --   BILITOT 0.6  --   --   PROT 6.6  --   --   ALBUMIN 2.4* 2.4* 2.4*    No results for input(s): LIPASE, AMYLASE in the last 168 hours. No results for input(s): AMMONIA in the last 168 hours.  CBC: Recent Labs  Lab  06/27/21 0042 06/27/21 0611 06/29/21 0536 06/30/21 0730  WBC 19.6* 19.6* 14.1* 14.5*  NEUTROABS 18.6*  --   --   --   HGB 9.1* 9.1* 8.5* 9.0*  HCT 28.3* 29.1* 25.4* 26.9*  MCV 89.3 87.9 87.0 87.6  PLT 272 267 251 268     Cardiac Enzymes: No results for input(s): CKTOTAL, CKMB, CKMBINDEX, TROPONINI in the last 168 hours.  BNP: Invalid input(s): POCBNP  CBG: Recent Labs  Lab 06/27/21 2107 06/28/21 1533 06/28/21 1745 06/29/21 2145 06/30/21 2328  GLUCAP 145* 140* 144* 176* 34     Microbiology: Results for orders placed or performed during the hospital encounter of 06/26/21  Blood culture (routine x 2)     Status: None   Collection Time: 06/27/21 12:42 AM   Specimen: BLOOD  Result Value Ref Range Status   Specimen Description BLOOD RIGHT ASSIST CONTROL  Final   Special Requests   Final    BOTTLES DRAWN AEROBIC AND ANAEROBIC Blood Culture adequate volume   Culture   Final    NO GROWTH 5 DAYS Performed at  [facility], [address] [place], [place] [address]    Report Status 07/02/2021 FINAL  Final  Resp Panel by RT-PCR (Flu A&B, Covid) Nasopharyngeal Swab     Status: None   Collection Time: 06/27/21 12:42 AM   Specimen: Nasopharyngeal Swab; Nasopharyngeal(NP) swabs in vial transport medium  Result Value Ref Range Status   SARS Coronavirus 2 by RT PCR NEGATIVE NEGATIVE Final    Comment: (NOTE) SARS-CoV-2 target nucleic acids are NOT DETECTED.  The SARS-CoV-2 RNA is generally detectable in upper respiratory specimens during the acute phase of infection. The lowest concentration of SARS-CoV-2 viral copies this assay can detect is 138 copies/mL. A negative result does not preclude SARS-Cov-2 infection and should not be used as the sole basis for treatment or other patient management decisions. A negative result may occur with  improper specimen collection/handling, submission of specimen other than nasopharyngeal swab, presence of viral mutation(s)  within the areas targeted by this assay, and inadequate number of viral copies(<138 copies/mL). A negative result must be combined with clinical observations, patient history, and epidemiological information. The expected result is Negative.  Fact Sheet for Patients:  EntrepreneurPulse.com.au  Fact Sheet for Healthcare Providers:  IncredibleEmployment.be  This test is no t yet approved or cleared by the [place] FDA and  has been authorized for detection and/or diagnosis of SARS-CoV-2 by FDA under an Emergency Use Authorization (EUA). This EUA will remain  in effect (meaning this test can be used) for the duration of the COVID-19 declaration under Section 564(b)(1) of the Act, 21 U.S.C.section 360bbb-3(b)(1), unless the authorization is terminated  or revoked sooner.       Influenza A by PCR NEGATIVE NEGATIVE Final   Influenza B by PCR NEGATIVE NEGATIVE Final    Comment: (NOTE) The Xpert Xpress SARS-CoV-2/FLU/RSV plus assay is intended as an aid in the diagnosis of influenza from Nasopharyngeal swab specimens and should not be used as a sole basis for treatment. Nasal washings and aspirates are unacceptable for Xpert Xpress SARS-CoV-2/FLU/RSV testing.  Fact Sheet for Patients: EntrepreneurPulse.com.au  Fact Sheet for Healthcare Providers: IncredibleEmployment.be  This test is not yet approved or cleared by the [place] FDA and has been authorized for detection and/or diagnosis of SARS-CoV-2 by FDA under an Emergency Use Authorization (EUA). This EUA will remain in effect (meaning this test can be used) for the duration of the COVID-19 declaration under Section 564(b)(1) of the Act, 21 U.S.C. section 360bbb-3(b)(1), unless the authorization is terminated or revoked.  Performed at [facility], [address] [place], [place] [address]   Blood culture (routine x 2)     Status: None    Collection Time: 06/27/21 12:43 AM   Specimen: BLOOD  Result Value Ref Range Status   Specimen Description BLOOD RIGHT FOREARM  Final   Special Requests   Final    BOTTLES DRAWN AEROBIC AND ANAEROBIC Blood Culture adequate volume   Culture   Final    NO GROWTH 5 DAYS Performed at [facility], [address] [place],  [address]    Report Status 07/02/2021 FINAL  Final  MRSA Next Gen by PCR, Nasal     Status: None   Collection Time: 06/28/21  5:31 AM   Specimen: Nasal Mucosa; Nasal Swab  Result Value Ref Range Status   MRSA by PCR Next Gen NOT DETECTED NOT DETECTED Final    Comment: (NOTE) The GeneXpert MRSA Assay (FDA approved for NASAL specimens only), is one component of  a comprehensive MRSA colonization surveillance program. It is not intended to diagnose MRSA infection nor to guide or monitor treatment for MRSA infections. Test performance is not FDA approved in patients less than 14 years old. Performed at [facility], [address] [place], [place] [address]     Coagulation Studies: Recent Labs    06/30/21 0730  LABPROT 16.9*  INR 1.4*     Urinalysis: No results for input(s): COLORURINE, LABSPEC, PHURINE, GLUCOSEU, HGBUR, BILIRUBINUR, KETONESUR, PROTEINUR, UROBILINOGEN, NITRITE, LEUKOCYTESUR in the last 72 hours.  Invalid input(s): APPERANCEUR    Imaging: No results found.   Medications:    sodium chloride     ceFEPime (MAXIPIME) IV Stopped (07/01/21 1023)   metronidazole 500 mg (07/01/21 2243)   vancomycin Stopped (06/30/21 1858)    acetaminophen  1,000 mg Oral TID   allopurinol  100 mg Oral Daily   amiodarone  200 mg Oral Daily   aspirin EC  81 mg Oral Daily   atorvastatin  40 mg Oral Daily   Chlorhexidine Gluconate Cloth  6 each Topical Q0600   clopidogrel  75 mg Oral Q breakfast   docusate sodium  100 mg Oral Daily   epoetin (EPOGEN/PROCRIT) injection  10,000 Units Intravenous Q M,W,F-HD   heparin injection  (subcutaneous)  5,000 Units Subcutaneous Q8H   insulin detemir  17 Units Subcutaneous QHS   levETIRAcetam  500 mg Oral BID   levETIRAcetam  500 mg Oral Once per day on Mon Wed Fri   mirtazapine  7.5 mg Oral QHS   polyethylene glycol  17 g Oral Daily   senna-docusate  1 tablet Oral QHS   sertraline  25 mg Oral QHS   sevelamer carbonate  800 mg Oral TID WC   sodium chloride flush  3 mL Intravenous Q12H   sodium chloride, feeding supplement (NEPRO CARB STEADY), ondansetron **OR** ondansetron (ZOFRAN) IV, oxyCODONE, sodium chloride flush, traZODone  Assessment/ Plan:  [name] is a 75 y.o.  male with systolic congestive heart failure, COPD, CAD, GERD, Hepatitis C, hypertension, dyslipidemia and end stage renal disease on dialysis, who was admitted to [facility] on 06/26/2021 for Acute respiratory failure with hypoxia ([place]) [J96.01] Sepsis ([place]) [A41.9] Fever, unspecified fever cause [R50.9] Hypervolemia, unspecified hypervolemia type [E87.70] Osteomyelitis of right foot, unspecified type ([place]) [M86.9] Sepsis, due to unspecified organism, unspecified whether acute organ dysfunction present ([place]) [A41.9]  [facility] [address]/MWF/Rt groin Permcath  End stage renal disease on dialysis:  Volume and electrolyte status acceptable, will continue hemodialysis treatments MWF   Anemia of chronic kidney disease  Lab Results  Component Value Date   HGB 9.0 (L) 06/30/2021  Will continue  Epogen with HD   3. Secondary Hyperparathyroidism:  Lab Results  Component Value Date   PTH 63 10/05/2017   PTH Comment 10/05/2017   CALCIUM 8.6 (L) 06/30/2021   CALCIUM 8.6 (L) 06/30/2021   CAION 1.05 (L) 07/12/2020   PHOS 5.9 (H) 06/30/2021   He is on Sevelamer 800 mg TID  4. Hypertension Blood Pressure 142/56 this morning Will continue monitoring closely   5. Diabetes mellitus type II with chronic kidney disease: insulin dependent.  Lab Results  Component Value Date   HGBA1C 6.2 12/11/2017    Blood glucose readings within acceptable range with current Insulin regimen  LOS: 5 Ernest [name] 9/18/20229:49 AM

## 2021-07-02 NOTE — Progress Notes (Signed)
PROGRESS NOTE  Ernest [name] W[facility]:[id number] DOB: 08-01-1946   PCP: Patient, [name] (Inactive)  Patient is from: SNF  DOA: 06/26/2021 LOS: 5  Chief complaints:  Chief Complaint  Patient presents with   Fever     Brief Narrative / Interim history: 75 year old M with PMH of ESRD on HD MWF, systolic CHF, COPD, CAD, DVT, CVA, hep C, HTN, HLD, GERD and cognitive impairment brought to ED from EMS with fever, chills and worsening right heel decubitus ulcer, and admitted for severe sepsis in the setting infected right heel decubitus ulcer and right heel osteomyelitis.  Started on vancomycin, cefepime and Flagyl.  Nephrology, podiatry and vascular surgery consulted.  MRI right ankle without contrast showed large wound overlying the posterior aspect of the calcaneus with mild subcortical bone marrow edema in the posterior calcaneus most consistent with osteomyelitis, partial-thickness tear of the Achilles tendon along the medial calcaneal insertion and plantar fasciitis.  Patient angiography that showed 70% right common femoral artery stenosis.  Podiatry and vascular surgery recommended BKA but patient's daughter likes to have second opinion.  Remains on broad-spectrum antibiotics  Subjective: Seen and examined earlier this morning.  No major events overnight of this morning.  Sitting in bed eating breakfast.  No complaints.  He denies chest pain, shortness of breath, GI or UTI symptoms.  He denies foot pain.  Objective: Vitals:   06/30/21 2011 07/01/21 1918 07/02/21 0500 07/02/21 0854  BP: (!) 97/59 (!) 138/43 (!) 142/56 (!) 117/53  Pulse: 99 69 78 68  Resp: '18 16 18 20  ' Temp: 98 F (36.7 C) 98.9 F (37.2 C) 98.6 F (37 C) 98.1 F (36.7 C)  TempSrc: Oral   Oral  SpO2: 97% 100% 98% 94%  Weight:      Height:        Intake/Output Summary (Last 24 hours) at 07/02/2021 1453 Last data filed at 07/01/2021 1800 Gross per 24 hour  Intake 200 ml  Output --  Net 200 ml   Filed Weights    06/28/21 1540 06/29/21 0500 06/30/21 0500  Weight: 88.7 kg 92.8 kg 91.4 kg    Examination:  GENERAL: No apparent distress.  Nontoxic. HEENT: MMM.  Vision and hearing grossly intact.  NECK: Supple.  No apparent JVD.  RESP: 98% on 3 L.  No IWOB.  Fair aeration bilaterally. CVS:  RRR. Heart sounds normal.  ABD/GI/GU: BS+. Abd soft, NTND.  MSK/EXT:  Moves extremities.  Dressing over right heel.  Faint DP pulses. SKIN: Dressing over right heel NEURO: Awake and alert. Oriented to self and place.  No apparent focal neuro deficit. PSYCH: Calm. Normal affect.   Procedures:  9/14-lower extremity catheterization with 70% stenosis of right common femoral artery  Microbiology summarized: COVID-19 and influenza PCR nonreactive. MRSA PCR screen negative. Blood cultures NGTD.  Assessment & Plan: Severe sepsis due to infected right heel ulcer and calcaneal osteomyelitis: POA.  Meets criteria with leukocytosis, tachypnea and lactic acidosis.  Blood cultures NGTD.  Angiography showed PAD as above.  CRP 26.  ESR 108. -Continue vancomycin, cefepime and Flagyl. -Podiatry and VVS recommended BKA but daughter, HCPOA not consenting until she gets here on 9/22. She is flying from another state.  Peripheral vascular disease: Angiography as above. -Continue aspirin and Plavix -Management per vascular surgery as above  ESRD on HD MWF: Bone mineral disorder -Nephrology managing-on scheduled  Chronic combined CHF-no cardiopulmonary symptoms.  TTE with LVEF of 50 to 55% (previously 45 to 50%), G2 DD.  Appears euvolemic on exam.  -Fluid management with hemodialysis. -Also on IV Lasix.  History of CAD/CABG: Stable. -Continue aspirin and atorvastatin  Controlled IDDM-2 with ESRD and neuropathy: A1c 6.2% in 2019.  BMP glucose within fair range. -Recheck hemoglobin A1c  Paroxysmal atrial fibrillation: Rate controlled.  Not on anticoagulation.  On aspirin and Plavix -Continue home amiodarone.  History  of CVA/vascular dementia: Awake.  Oriented to self and partial place but limited insight.  He thinks he is in the hospital because his roommates tested positive for COVID.  -Continue aspirin and Plavix as above. -Reorientation and delirium precautions.  History of seizures: Stable -Continue Keppra  Depression -Continue home Zoloft and Remeron  Constipation -Bowel regimen  Debility/physical deconditioning -PT/OT -NWB on right lower extremity.  Goal of care counseling: multiple comorbidities as above.  Poor long-term prognosis.  Still full code.  I do think patient comprehends such complaints discussion to make decision.  Patient's daughter, [facility] POA wants him to remain full code -Appreciate help by palliative care  Body mass index is 29.77 kg/m.       Pressure skin injury: POA Pressure Injury 06/26/21 Heel Right Unstageable - Full thickness tissue loss in which the base of the injury is covered by slough (yellow, tan, gray, green or brown) and/or eschar (tan, brown or black) in the wound bed. Pt has unstageable pressure injury (Active)  06/26/21 2319  Location: Heel  Location Orientation: Right  Staging: Unstageable - Full thickness tissue loss in which the base of the injury is covered by slough (yellow, tan, gray, green or brown) and/or eschar (tan, brown or black) in the wound bed.  Wound Description (Comments): Pt has unstageable pressure injury to R lateral heel, necrotic, eschar in base.  Present on Admission: Yes   DVT prophylaxis:  heparin injection 5,000 Units Start: 06/27/21 0600 SCDs Start: 06/27/21 0248  Code Status: Full code Family Communication: Patient and RN.  No family member at bedside. Level of care: Med-Surg Status is: Inpatient  Remains inpatient appropriate because:Ongoing diagnostic testing needed not appropriate for outpatient work up, IV treatments appropriate due to intensity of illness or inability to take PO, and Inpatient level of care appropriate  due to severity of illness  Dispo: The patient is from: SNF              Anticipated d/c is to: SNF              Patient currently is not medically stable to d/c.   Difficult to place patient No       Consultants:  Nephrology Podiatry Vascular surgery Palliative medicine   Sch Meds:  Scheduled Meds:  acetaminophen  1,000 mg Oral TID   allopurinol  100 mg Oral Daily   amiodarone  200 mg Oral Daily   aspirin EC  81 mg Oral Daily   atorvastatin  40 mg Oral Daily   Chlorhexidine Gluconate Cloth  6 each Topical Q0600   clopidogrel  75 mg Oral Q breakfast   docusate sodium  100 mg Oral Daily   epoetin (EPOGEN/PROCRIT) injection  10,000 Units Intravenous Q M,W,F-HD   heparin injection (subcutaneous)  5,000 Units Subcutaneous Q8H   insulin detemir  17 Units Subcutaneous QHS   levETIRAcetam  500 mg Oral BID   levETIRAcetam  500 mg Oral Once per day on Mon Wed Fri   mirtazapine  7.5 mg Oral QHS   senna-docusate  1 tablet Oral QHS   sertraline  25 mg Oral QHS  sevelamer carbonate  800 mg Oral TID WC   sodium chloride flush  3 mL Intravenous Q12H   Continuous Infusions:  sodium chloride     ceFEPime (MAXIPIME) IV 1 g (07/02/21 1015)   metronidazole 500 mg (07/02/21 1230)   vancomycin Stopped (06/30/21 1858)   PRN Meds:.sodium chloride, feeding supplement (NEPRO CARB STEADY), ondansetron **OR** ondansetron (ZOFRAN) IV, oxyCODONE, polyethylene glycol, sodium chloride flush, traZODone  Antimicrobials: Anti-infectives (From admission, onward)    Start     Dose/Rate Route Frequency Ordered Stop   07/01/21 1000  ceFEPIme (MAXIPIME) 1 g in sodium chloride 0.9 % 100 mL IVPB        1 g 200 mL/hr over 30 Minutes Intravenous Every 24 hours 06/30/21 1254     06/30/21 1600  vancomycin (VANCOCIN) IVPB 1000 mg/200 mL premix        1,000 mg 200 mL/hr over 60 Minutes Intravenous Every M-W-F (Hemodialysis) 06/30/21 1254     06/29/21 0800  ceFAZolin (ANCEF) IVPB 1 g/50 mL premix  Status:   Discontinued       Note to Pharmacy: To be given in specials   1 g 100 mL/hr over 30 Minutes Intravenous  Once 06/28/21 1441 06/28/21 1651   06/28/21 1513  ceFAZolin (ANCEF) 1-4 GM/50ML-% IVPB       Note to Pharmacy: Corlis Hove   : cabinet override      06/28/21 1513 06/29/21 0314   06/28/21 1200  vancomycin (VANCOCIN) IVPB 1000 mg/200 mL premix  Status:  Discontinued        1,000 mg 200 mL/hr over 60 Minutes Intravenous Every M-W-F (Hemodialysis) 06/27/21 0959 06/30/21 1254   06/27/21 1100  metroNIDAZOLE (FLAGYL) IVPB 500 mg        500 mg 100 mL/hr over 60 Minutes Intravenous Every 12 hours 06/27/21 0250 07/04/21 1059   06/27/21 1100  vancomycin (VANCOREADY) IVPB 750 mg/150 mL        750 mg 150 mL/hr over 60 Minutes Intravenous  Once 06/27/21 1000 06/27/21 1137   06/27/21 1000  ceFEPIme (MAXIPIME) 1 g in sodium chloride 0.9 % 100 mL IVPB  Status:  Discontinued        1 g 200 mL/hr over 30 Minutes Intravenous  Once 06/27/21 0250 06/27/21 0316   06/27/21 1000  ceFEPIme (MAXIPIME) 1 g in sodium chloride 0.9 % 100 mL IVPB  Status:  Discontinued        1 g 200 mL/hr over 30 Minutes Intravenous Every 24 hours 06/27/21 0316 06/30/21 1254   06/27/21 0336  vancomycin (VANCOCIN) IVPB 1000 mg/200 mL premix  Status:  Discontinued        1,000 mg 200 mL/hr over 60 Minutes Intravenous Every Dialysis 06/27/21 0337 06/27/21 1258   06/27/21 0330  vancomycin (VANCOREADY) IVPB 750 mg/150 mL  Status:  Discontinued        750 mg 150 mL/hr over 60 Minutes Intravenous  Once 06/27/21 0258 06/27/21 0959   06/27/21 0300  vancomycin (VANCOCIN) IVPB 1000 mg/200 mL premix  Status:  Discontinued        1,000 mg 200 mL/hr over 60 Minutes Intravenous  Once 06/27/21 0250 06/27/21 0258   06/27/21 0115  piperacillin-tazobactam (ZOSYN) IVPB 3.375 g        3.375 g 100 mL/hr over 30 Minutes Intravenous  Once 06/27/21 0112 06/27/21 0255   06/26/21 2330  cefTRIAXone (ROCEPHIN) 1 g in sodium chloride 0.9 % 100 mL  IVPB  Status:  Discontinued  1 g 200 mL/hr over 30 Minutes Intravenous  Once 06/26/21 2321 06/27/21 0111   06/26/21 2330  vancomycin (VANCOCIN) IVPB 1000 mg/200 mL premix        1,000 mg 200 mL/hr over 60 Minutes Intravenous  Once 06/26/21 2321 06/27/21 0210        I have personally reviewed the following labs and images: CBC: Recent Labs  Lab 06/27/21 0042 06/27/21 0611 06/29/21 0536 06/30/21 0730  WBC 19.6* 19.6* 14.1* 14.5*  NEUTROABS 18.6*  --   --   --   HGB 9.1* 9.1* 8.5* 9.0*  HCT 28.3* 29.1* 25.4* 26.9*  MCV 89.3 87.9 87.0 87.6  PLT 272 267 251 268   BMP &GFR Recent Labs  Lab 06/27/21 0042 06/27/21 0611 06/28/21 0311 06/29/21 0536 06/30/21 0730  NA 135 136 137 135 136  136  K 5.3* 5.1 4.9 4.4 4.1  4.0  CL 93* 94* 97* 95* 98  96*  CO2 '28 30 25 27 27  27  ' GLUCOSE 330* 296* 91 159* 88  86  BUN 62* 73* 84* 53* 65*  67*  CREATININE 7.02* 7.31* 8.67* 5.91* 7.45*  7.43*  CALCIUM 8.1* 8.1* 8.3* 8.5* 8.6*  8.6*  MG  --   --   --  2.0 2.3  PHOS  --   --   --  4.9* 5.9*   Estimated Creatinine Clearance: 10 mL/min (A) (by C-G formula based on SCr of 7.43 mg/dL (H)). Liver & Pancreas: Recent Labs  Lab 06/27/21 0042 06/29/21 0536 06/30/21 0730  AST 16  --   --   ALT 14  --   --   ALKPHOS 110  --   --   BILITOT 0.6  --   --   PROT 6.6  --   --   ALBUMIN 2.4* 2.4* 2.4*   No results for input(s): LIPASE, AMYLASE in the last 168 hours. No results for input(s): AMMONIA in the last 168 hours. Diabetic: No results for input(s): HGBA1C in the last 72 hours. Recent Labs  Lab 06/27/21 2107 06/28/21 1533 06/28/21 1745 06/29/21 2145 06/30/21 2328  GLUCAP 145* 140* 144* 176* 81   Cardiac Enzymes: No results for input(s): CKTOTAL, CKMB, CKMBINDEX, TROPONINI in the last 168 hours. No results for input(s): PROBNP in the last 8760 hours. Coagulation Profile: Recent Labs  Lab 06/27/21 0226 06/27/21 0611 06/30/21 0730  INR 1.4* 1.4* 1.4*    Thyroid Function Tests: Recent Labs    06/30/21 0730  TSH 1.428   Lipid Profile: No results for input(s): CHOL, HDL, LDLCALC, TRIG, CHOLHDL, LDLDIRECT in the last 72 hours. Anemia Panel: No results for input(s): VITAMINB12, FOLATE, FERRITIN, TIBC, IRON, RETICCTPCT in the last 72 hours. Urine analysis:    Component Value Date/Time   COLORURINE YELLOW 04/29/2015 1702   APPEARANCEUR CLEAR 04/29/2015 1702   LABSPEC 1.016 04/29/2015 1702   PHURINE 7.5 04/29/2015 1702   GLUCOSEU >1000 (A) 04/29/2015 1702   HGBUR SMALL (A) 04/29/2015 1702   BILIRUBINUR NEGATIVE 04/29/2015 1702   KETONESUR NEGATIVE 04/29/2015 1702   PROTEINUR >300 (A) 04/29/2015 1702   UROBILINOGEN 0.2 04/29/2015 1702   NITRITE NEGATIVE 04/29/2015 1702   LEUKOCYTESUR NEGATIVE 04/29/2015 1702   Sepsis Labs: Invalid input(s): PROCALCITONIN, [place]  Microbiology: Recent Results (from the past 240 hour(s))  Blood culture (routine x 2)     Status: None   Collection Time: 06/27/21 12:42 AM   Specimen: BLOOD  Result Value Ref Range Status   Specimen Description BLOOD  RIGHT ASSIST CONTROL  Final   Special Requests   Final    BOTTLES DRAWN AEROBIC AND ANAEROBIC Blood Culture adequate volume   Culture   Final    NO GROWTH 5 DAYS Performed at [facility], [address] [place], [place] [address]    Report Status 07/02/2021 FINAL  Final  Resp Panel by RT-PCR (Flu A&B, Covid) Nasopharyngeal Swab     Status: None   Collection Time: 06/27/21 12:42 AM   Specimen: Nasopharyngeal Swab; Nasopharyngeal(NP) swabs in vial transport medium  Result Value Ref Range Status   SARS Coronavirus 2 by RT PCR NEGATIVE NEGATIVE Final    Comment: (NOTE) SARS-CoV-2 target nucleic acids are NOT DETECTED.  The SARS-CoV-2 RNA is generally detectable in upper respiratory specimens during the acute phase of infection. The lowest concentration of SARS-CoV-2 viral copies this assay can detect is 138 copies/mL. A negative  result does not preclude SARS-Cov-2 infection and should not be used as the sole basis for treatment or other patient management decisions. A negative result may occur with  improper specimen collection/handling, submission of specimen other than nasopharyngeal swab, presence of viral mutation(s) within the areas targeted by this assay, and inadequate number of viral copies(<138 copies/mL). A negative result must be combined with clinical observations, patient history, and epidemiological information. The expected result is Negative.  Fact Sheet for Patients:  EntrepreneurPulse.com.au  Fact Sheet for Healthcare Providers:  IncredibleEmployment.be  This test is no t yet approved or cleared by the [place] FDA and  has been authorized for detection and/or diagnosis of SARS-CoV-2 by FDA under an Emergency Use Authorization (EUA). This EUA will remain  in effect (meaning this test can be used) for the duration of the COVID-19 declaration under Section 564(b)(1) of the Act, 21 U.S.C.section 360bbb-3(b)(1), unless the authorization is terminated  or revoked sooner.       Influenza A by PCR NEGATIVE NEGATIVE Final   Influenza B by PCR NEGATIVE NEGATIVE Final    Comment: (NOTE) The Xpert Xpress SARS-CoV-2/FLU/RSV plus assay is intended as an aid in the diagnosis of influenza from Nasopharyngeal swab specimens and should not be used as a sole basis for treatment. Nasal washings and aspirates are unacceptable for Xpert Xpress SARS-CoV-2/FLU/RSV testing.  Fact Sheet for Patients: EntrepreneurPulse.com.au  Fact Sheet for Healthcare Providers: IncredibleEmployment.be  This test is not yet approved or cleared by the [place] FDA and has been authorized for detection and/or diagnosis of SARS-CoV-2 by FDA under an Emergency Use Authorization (EUA). This EUA will remain in effect (meaning this test can be used)  for the duration of the COVID-19 declaration under Section 564(b)(1) of the Act, 21 U.S.C. section 360bbb-3(b)(1), unless the authorization is terminated or revoked.  Performed at [facility], [address] [place], [place] [address]   Blood culture (routine x 2)     Status: None   Collection Time: 06/27/21 12:43 AM   Specimen: BLOOD  Result Value Ref Range Status   Specimen Description BLOOD RIGHT FOREARM  Final   Special Requests   Final    BOTTLES DRAWN AEROBIC AND ANAEROBIC Blood Culture adequate volume   Culture   Final    NO GROWTH 5 DAYS Performed at [facility], [address], [place], [place] [address]    Report Status 07/02/2021 FINAL  Final  MRSA Next Gen by PCR, Nasal     Status: None   Collection Time: 06/28/21  5:31 AM   Specimen: Nasal Mucosa;  Nasal Swab  Result Value Ref Range Status   MRSA by PCR Next Gen NOT DETECTED NOT DETECTED Final    Comment: (NOTE) The GeneXpert MRSA Assay (FDA approved for NASAL specimens only), is one component of a comprehensive MRSA colonization surveillance program. It is not intended to diagnose MRSA infection nor to guide or monitor treatment for MRSA infections. Test performance is not FDA approved in patients less than 34 years old. Performed at [facility], [address], [place], [place] [address]     Radiology Studies: No results found.     Ceylin Dreibelbis T. Brandenburg  If 7PM-7AM, please contact night-coverage [email or web address] 07/02/2021, 2:53 PM

## 2021-07-03 ENCOUNTER — Encounter: Payer: Self-pay | Admitting: Family Medicine

## 2021-07-03 DIAGNOSIS — E109 Type 1 diabetes mellitus without complications: Secondary | ICD-10-CM | POA: Diagnosis not present

## 2021-07-03 DIAGNOSIS — I5042 Chronic combined systolic (congestive) and diastolic (congestive) heart failure: Secondary | ICD-10-CM | POA: Diagnosis not present

## 2021-07-03 DIAGNOSIS — N186 End stage renal disease: Secondary | ICD-10-CM | POA: Diagnosis not present

## 2021-07-03 DIAGNOSIS — I739 Peripheral vascular disease, unspecified: Secondary | ICD-10-CM | POA: Diagnosis not present

## 2021-07-03 LAB — RENAL FUNCTION PANEL
Albumin: 2.3 g/dL — ABNORMAL LOW (ref 3.5–5.0)
Anion gap: 11 (ref 5–15)
BUN: 75 mg/dL — ABNORMAL HIGH (ref 8–23)
CO2: 25 mmol/L (ref 22–32)
Calcium: 8.7 mg/dL — ABNORMAL LOW (ref 8.9–10.3)
Chloride: 99 mmol/L (ref 98–111)
Creatinine, Ser: 8.6 mg/dL — ABNORMAL HIGH (ref 0.61–1.24)
GFR, Estimated: 6 mL/min — ABNORMAL LOW (ref >60)
Glucose, Bld: 237 mg/dL — ABNORMAL HIGH (ref 70–99)
Phosphorus: 7 mg/dL — ABNORMAL HIGH (ref 2.5–4.6)
Potassium: 4 mmol/L (ref 3.5–5.1)
Sodium: 135 mmol/L (ref 135–145)

## 2021-07-03 LAB — HEMOGLOBIN A1C
Hgb A1c MFr Bld: 8.3 % — ABNORMAL HIGH (ref 4.8–5.6)
Hgb A1c MFr Bld: 8.4 % — ABNORMAL HIGH (ref 4.8–5.6)
Mean Plasma Glucose: 191.51 mg/dL
Mean Plasma Glucose: 194.38 mg/dL

## 2021-07-03 LAB — CBC
HCT: 27.1 % — ABNORMAL LOW (ref 39.0–52.0)
Hemoglobin: 8.9 g/dL — ABNORMAL LOW (ref 13.0–17.0)
MCH: 28.5 pg (ref 26.0–34.0)
MCHC: 32.8 g/dL (ref 30.0–36.0)
MCV: 86.9 fL (ref 80.0–100.0)
Platelets: 307 10*3/uL (ref 150–400)
RBC: 3.12 MIL/uL — ABNORMAL LOW (ref 4.22–5.81)
RDW: 15.6 % — ABNORMAL HIGH (ref 11.5–15.5)
WBC: 17 10*3/uL — ABNORMAL HIGH (ref 4.0–10.5)
nRBC: 0 % (ref 0.0–0.2)

## 2021-07-03 LAB — GLUCOSE, CAPILLARY
Glucose-Capillary: 189 mg/dL — ABNORMAL HIGH (ref 70–99)
Glucose-Capillary: 107 mg/dL — ABNORMAL HIGH (ref 70–99)
Glucose-Capillary: 112 mg/dL — ABNORMAL HIGH (ref 70–99)

## 2021-07-03 LAB — MAGNESIUM: Magnesium: 2.2 mg/dL (ref 1.7–2.4)

## 2021-07-03 MED ORDER — INSULIN ASPART 100 UNIT/ML IJ SOLN
0.0000 [IU] | Freq: Three times a day (TID) | INTRAMUSCULAR | Status: DC
  Administered 2021-07-03 – 2021-07-07 (×5): 1 [IU] via SUBCUTANEOUS
  Administered 2021-07-09 (×3): 2 [IU] via SUBCUTANEOUS
  Filled 2021-07-03 (×9): qty 1

## 2021-07-03 MED ORDER — INSULIN ASPART 100 UNIT/ML IJ SOLN: 0.0000 [IU] | Freq: Every day | INTRAMUSCULAR | Status: DC

## 2021-07-03 MED ORDER — INSULIN DETEMIR 100 UNIT/ML ~~LOC~~ SOLN
10.0000 [IU] | Freq: Every day | SUBCUTANEOUS | Status: DC
  Administered 2021-07-03 – 2021-07-07 (×4): 10 [IU] via SUBCUTANEOUS
  Filled 2021-07-03 (×6): qty 0.1

## 2021-07-03 MED ORDER — INSULIN ASPART 100 UNIT/ML IJ SOLN
2.0000 [IU] | Freq: Three times a day (TID) | INTRAMUSCULAR | Status: DC
  Administered 2021-07-03 – 2021-07-07 (×6): 2 [IU] via SUBCUTANEOUS
  Filled 2021-07-03 (×6): qty 1

## 2021-07-03 NOTE — Progress Notes (Signed)
Per doctor UF is off and to remain off during treatment due to low BP. Goal also dropped from 2500 to a net goal of 1000 per doctor.

## 2021-07-03 NOTE — Progress Notes (Signed)
Spoke with Katrina in central telemetry at [phone] to transfer patient from room 226 to [place]

## 2021-07-03 NOTE — Progress Notes (Signed)
Pt tolerated tx well , UF remained off per doctor due to low BP and pt is alert and oriented with no complaints.

## 2021-07-03 NOTE — Progress Notes (Addendum)
PROGRESS NOTE  CHAROD SLAWINSKI WER:[id number] DOB: 1946-04-02   PCP: Patient, [name] (Inactive)  Patient is from: SNF  DOA: 06/26/2021 LOS: 6  Chief complaints:  Chief Complaint  Patient presents with   Fever     Brief Narrative / Interim history: 75 year old M with PMH of ESRD on HD MWF, systolic CHF, COPD, CAD, DVT, CVA, hep C, HTN, HLD, GERD and cognitive impairment brought to ED from EMS with fever, chills and worsening right heel decubitus ulcer, and admitted for severe sepsis in the setting infected right heel decubitus ulcer and right heel osteomyelitis.  Started on vancomycin, cefepime and Flagyl.  Nephrology, podiatry and vascular surgery consulted.  MRI right ankle without contrast showed large wound overlying the posterior aspect of the calcaneus with mild subcortical bone marrow edema in the posterior calcaneus most consistent with osteomyelitis, partial-thickness tear of the Achilles tendon along the medial calcaneal insertion and plantar fasciitis.  Patient angiography that showed 70% right common femoral artery stenosis.  Podiatry and vascular surgery recommended BKA but patient's daughter likes to have second opinion.  Remains on broad-spectrum antibiotics  Subjective: Seen and examined earlier this morning.  No major events overnight of this morning.  No complaints but not a great historian.  He denies chest pain, dyspnea, GI symptoms of foot pain.  Objective: Vitals:   07/03/21 1230 07/03/21 1245 07/03/21 1300 07/03/21 1315  BP: (!) 89/47 (!) 87/40 (!) 113/45 100/68  Pulse:  68 60 (!) 59  Resp: 17 (!) 31 (!) 27 18  Temp:      TempSrc:      SpO2:      Weight:      Height:        Intake/Output Summary (Last 24 hours) at 07/03/2021 1849 Last data filed at 07/03/2021 1755 Gross per 24 hour  Intake 153.8 ml  Output 19 ml  Net 134.8 ml   Filed Weights   06/28/21 1540 06/29/21 0500 06/30/21 0500  Weight: 88.7 kg 92.8 kg 91.4 kg    Examination:  GENERAL: No  apparent distress.  Nontoxic. HEENT: MMM.  Vision and hearing grossly intact.  NECK: Supple.  No apparent JVD.  RESP: 96% on 3 L.  No IWOB.  Fair aeration bilaterally. CVS:  RRR. Heart sounds normal.  ABD/GI/GU: BS+. Abd soft, NTND.  MSK/EXT:  Moves extremities. No apparent deformity.  Faint DP pulses. SKIN: Dressing over right heel. NEURO: Awake and alert. Oriented to self and place.  No apparent focal neuro deficit. PSYCH: Calm. Normal affect.   Procedures:  9/14-lower extremity catheterization with 70% stenosis of right common femoral artery  Microbiology summarized: COVID-19 and influenza PCR nonreactive. MRSA PCR screen negative. Blood cultures NGTD.  Assessment & Plan: Severe sepsis due to infected right heel ulcer and calcaneal osteomyelitis: POA.  Meets criteria with leukocytosis, tachypnea and lactic acidosis.  Blood cultures NGTD.  Angiography showed PAD as above.  CRP 26.  ESR 108. -Continue vancomycin, cefepime and Flagyl. -Podiatry and VVS recs: BKA but daughter not consenting until she is here on 9/22. She is flying from another state.  Peripheral vascular disease: Angiography as above. -Continue aspirin and Plavix -Management per vascular surgery as above  ESRD on HD MWF: Bone mineral disorder -Nephrology managing-on scheduled  Chronic combined CHF-no cardiopulmonary symptoms.  TTE with LVEF of 50 to 55% (previously 45 to 50%), G2 DD.  Appears euvolemic on exam.  -Fluid management with hemodialysis.  History of CAD/CABG: Stable. -Continue aspirin and atorvastatin  Uncontrolled IDDM-2 with ESRD and neuropathy: A1c 8.4%.  CBG elevated. Recent Labs  Lab 06/29/21 2145 06/30/21 2328 07/02/21 1938 07/03/21 0812 07/03/21 1643  GLUCAP 176* 81 320* 189* 107*  -Continue SSI-sensitive -Add mealtime coverage at 2 units -Levemir 10 units nightly.  Hold if CBG less than 110.   Paroxysmal atrial fibrillation: Rate controlled.  Not on anticoagulation.  On aspirin  and Plavix -Continue home amiodarone.  History of CVA/vascular dementia: Awake.  Oriented to self and partial place but limited insight.  He thinks he is in the hospital because his roommates tested positive for COVID.  -Continue aspirin and Plavix as above. -Reorientation and delirium precautions.  History of seizures: Stable -Continue Keppra  Depression -Continue home Zoloft and Remeron  Constipation -Bowel regimen  Debility/physical deconditioning -PT/OT -NWB on right lower extremity.  Goal of care counseling: multiple comorbidities as above.  Poor long-term prognosis.  Still full code.  I do think patient comprehends such complaints discussion to make decision.  Patient's daughter, [facility] POA wants him to remain full code -Appreciate help by palliative care  Body mass index is 29.77 kg/m.       Pressure skin injury: POA Pressure Injury 06/26/21 Heel Right Unstageable - Full thickness tissue loss in which the base of the injury is covered by slough (yellow, tan, gray, green or brown) and/or eschar (tan, brown or black) in the wound bed. Pt has unstageable pressure injury (Active)  06/26/21 2319  Location: Heel  Location Orientation: Right  Staging: Unstageable - Full thickness tissue loss in which the base of the injury is covered by slough (yellow, tan, gray, green or brown) and/or eschar (tan, brown or black) in the wound bed.  Wound Description (Comments): Pt has unstageable pressure injury to R lateral heel, necrotic, eschar in base.  Present on Admission: Yes   DVT prophylaxis:  heparin injection 5,000 Units Start: 06/27/21 0600 SCDs Start: 06/27/21 0248  Code Status: Full code Family Communication: Patient and RN.  No family member at bedside. Level of care: Med-Surg Status is: Inpatient  Remains inpatient appropriate because:Ongoing diagnostic testing needed not appropriate for outpatient work up, IV treatments appropriate due to intensity of illness or inability  to take PO, and Inpatient level of care appropriate due to severity of illness  Dispo: The patient is from: SNF              Anticipated d/c is to: SNF              Patient currently is not medically stable to d/c.   Difficult to place patient No       Consultants:  Nephrology Podiatry Vascular surgery Palliative medicine   Sch Meds:  Scheduled Meds:  acetaminophen  1,000 mg Oral TID   allopurinol  100 mg Oral Daily   amiodarone  200 mg Oral Daily   aspirin EC  81 mg Oral Daily   atorvastatin  40 mg Oral Daily   Chlorhexidine Gluconate Cloth  6 each Topical Q0600   clopidogrel  75 mg Oral Q breakfast   docusate sodium  100 mg Oral Daily   epoetin (EPOGEN/PROCRIT) injection  10,000 Units Intravenous Q M,W,F-HD   heparin injection (subcutaneous)  5,000 Units Subcutaneous Q8H   insulin aspart  0-5 Units Subcutaneous QHS   insulin aspart  0-6 Units Subcutaneous TID WC   insulin aspart  2 Units Subcutaneous TID WC   insulin detemir  10 Units Subcutaneous QHS   levETIRAcetam  500 mg Oral  BID   levETIRAcetam  500 mg Oral Once per day on Mon Wed Fri   mirtazapine  7.5 mg Oral QHS   senna-docusate  1 tablet Oral QHS   sertraline  25 mg Oral QHS   sevelamer carbonate  800 mg Oral TID WC   sodium chloride flush  3 mL Intravenous Q12H   Continuous Infusions:  sodium chloride     ceFEPime (MAXIPIME) IV Stopped (07/03/21 1356)   metronidazole 100 mL/hr at 07/03/21 0850   vancomycin Stopped (07/03/21 1323)   PRN Meds:.sodium chloride, feeding supplement (NEPRO CARB STEADY), ondansetron **OR** ondansetron (ZOFRAN) IV, oxyCODONE, polyethylene glycol, sodium chloride flush, traZODone  Antimicrobials: Anti-infectives (From admission, onward)    Start     Dose/Rate Route Frequency Ordered Stop   07/01/21 1000  ceFEPIme (MAXIPIME) 1 g in sodium chloride 0.9 % 100 mL IVPB        1 g 200 mL/hr over 30 Minutes Intravenous Every 24 hours 06/30/21 1254     06/30/21 1600  vancomycin  (VANCOCIN) IVPB 1000 mg/200 mL premix        1,000 mg 200 mL/hr over 60 Minutes Intravenous Every M-W-F (Hemodialysis) 06/30/21 1254     06/29/21 0800  ceFAZolin (ANCEF) IVPB 1 g/50 mL premix  Status:  Discontinued       Note to Pharmacy: To be given in specials   1 g 100 mL/hr over 30 Minutes Intravenous  Once 06/28/21 1441 06/28/21 1651   06/28/21 1513  ceFAZolin (ANCEF) 1-4 GM/50ML-% IVPB       Note to Pharmacy: Corlis Hove   : cabinet override      06/28/21 1513 06/29/21 0314   06/28/21 1200  vancomycin (VANCOCIN) IVPB 1000 mg/200 mL premix  Status:  Discontinued        1,000 mg 200 mL/hr over 60 Minutes Intravenous Every M-W-F (Hemodialysis) 06/27/21 0959 06/30/21 1254   06/27/21 1100  metroNIDAZOLE (FLAGYL) IVPB 500 mg        500 mg 100 mL/hr over 60 Minutes Intravenous Every 12 hours 06/27/21 0250 07/04/21 1059   06/27/21 1100  vancomycin (VANCOREADY) IVPB 750 mg/150 mL        750 mg 150 mL/hr over 60 Minutes Intravenous  Once 06/27/21 1000 06/27/21 1137   06/27/21 1000  ceFEPIme (MAXIPIME) 1 g in sodium chloride 0.9 % 100 mL IVPB  Status:  Discontinued        1 g 200 mL/hr over 30 Minutes Intravenous  Once 06/27/21 0250 06/27/21 0316   06/27/21 1000  ceFEPIme (MAXIPIME) 1 g in sodium chloride 0.9 % 100 mL IVPB  Status:  Discontinued        1 g 200 mL/hr over 30 Minutes Intravenous Every 24 hours 06/27/21 0316 06/30/21 1254   06/27/21 0336  vancomycin (VANCOCIN) IVPB 1000 mg/200 mL premix  Status:  Discontinued        1,000 mg 200 mL/hr over 60 Minutes Intravenous Every Dialysis 06/27/21 0337 06/27/21 1258   06/27/21 0330  vancomycin (VANCOREADY) IVPB 750 mg/150 mL  Status:  Discontinued        750 mg 150 mL/hr over 60 Minutes Intravenous  Once 06/27/21 0258 06/27/21 0959   06/27/21 0300  vancomycin (VANCOCIN) IVPB 1000 mg/200 mL premix  Status:  Discontinued        1,000 mg 200 mL/hr over 60 Minutes Intravenous  Once 06/27/21 0250 06/27/21 0258   06/27/21 0115   piperacillin-tazobactam (ZOSYN) IVPB 3.375 g  3.375 g 100 mL/hr over 30 Minutes Intravenous  Once 06/27/21 0112 06/27/21 0255   06/26/21 2330  cefTRIAXone (ROCEPHIN) 1 g in sodium chloride 0.9 % 100 mL IVPB  Status:  Discontinued        1 g 200 mL/hr over 30 Minutes Intravenous  Once 06/26/21 2321 06/27/21 0111   06/26/21 2330  vancomycin (VANCOCIN) IVPB 1000 mg/200 mL premix        1,000 mg 200 mL/hr over 60 Minutes Intravenous  Once 06/26/21 2321 06/27/21 0210        I have personally reviewed the following labs and images: CBC: Recent Labs  Lab 06/27/21 0042 06/27/21 0611 06/29/21 0536 06/30/21 0730 07/03/21 0527  WBC 19.6* 19.6* 14.1* 14.5* 17.0*  NEUTROABS 18.6*  --   --   --   --   HGB 9.1* 9.1* 8.5* 9.0* 8.9*  HCT 28.3* 29.1* 25.4* 26.9* 27.1*  MCV 89.3 87.9 87.0 87.6 86.9  PLT 272 267 251 268 307   BMP &GFR Recent Labs  Lab 06/27/21 0611 06/28/21 0311 06/29/21 0536 06/30/21 0730 07/03/21 0527  NA 136 137 135 136  136 135  K 5.1 4.9 4.4 4.1  4.0 4.0  CL 94* 97* 95* 98  96* 99  CO2 '30 25 27 27  27 25  ' GLUCOSE 296* 91 159* 88  86 237*  BUN 73* 84* 53* 65*  67* 75*  CREATININE 7.31* 8.67* 5.91* 7.45*  7.43* 8.60*  CALCIUM 8.1* 8.3* 8.5* 8.6*  8.6* 8.7*  MG  --   --  2.0 2.3 2.2  PHOS  --   --  4.9* 5.9* 7.0*   Estimated Creatinine Clearance: 8.7 mL/min (A) (by C-G formula based on SCr of 8.6 mg/dL (H)). Liver & Pancreas: Recent Labs  Lab 06/27/21 0042 06/29/21 0536 06/30/21 0730 07/03/21 0527  AST 16  --   --   --   ALT 14  --   --   --   ALKPHOS 110  --   --   --   BILITOT 0.6  --   --   --   PROT 6.6  --   --   --   ALBUMIN 2.4* 2.4* 2.4* 2.3*   No results for input(s): LIPASE, AMYLASE in the last 168 hours. No results for input(s): AMMONIA in the last 168 hours. Diabetic: Recent Labs    07/03/21 0527  HGBA1C 8.4*   Recent Labs  Lab 06/29/21 2145 06/30/21 2328 07/02/21 1938 07/03/21 0812 07/03/21 1643  GLUCAP 176* 81  320* 189* 107*   Cardiac Enzymes: No results for input(s): CKTOTAL, CKMB, CKMBINDEX, TROPONINI in the last 168 hours. No results for input(s): PROBNP in the last 8760 hours. Coagulation Profile: Recent Labs  Lab 06/27/21 0226 06/27/21 0611 06/30/21 0730  INR 1.4* 1.4* 1.4*   Thyroid Function Tests: No results for input(s): TSH, T4TOTAL, FREET4, T3FREE, THYROIDAB in the last 72 hours.  Lipid Profile: No results for input(s): CHOL, HDL, LDLCALC, TRIG, CHOLHDL, LDLDIRECT in the last 72 hours. Anemia Panel: No results for input(s): VITAMINB12, FOLATE, FERRITIN, TIBC, IRON, RETICCTPCT in the last 72 hours. Urine analysis:    Component Value Date/Time   COLORURINE YELLOW 04/29/2015 1702   APPEARANCEUR CLEAR 04/29/2015 1702   LABSPEC 1.016 04/29/2015 1702   PHURINE 7.5 04/29/2015 1702   GLUCOSEU >1000 (A) 04/29/2015 1702   HGBUR SMALL (A) 04/29/2015 1702   BILIRUBINUR NEGATIVE 04/29/2015 1702   KETONESUR NEGATIVE 04/29/2015 1702   PROTEINUR >300 (A)  04/29/2015 1702   UROBILINOGEN 0.2 04/29/2015 1702   NITRITE NEGATIVE 04/29/2015 1702   LEUKOCYTESUR NEGATIVE 04/29/2015 1702   Sepsis Labs: Invalid input(s): PROCALCITONIN, [place]  Microbiology: Recent Results (from the past 240 hour(s))  Blood culture (routine x 2)     Status: None   Collection Time: 06/27/21 12:42 AM   Specimen: BLOOD  Result Value Ref Range Status   Specimen Description BLOOD RIGHT ASSIST CONTROL  Final   Special Requests   Final    BOTTLES DRAWN AEROBIC AND ANAEROBIC Blood Culture adequate volume   Culture   Final    NO GROWTH 5 DAYS Performed at [facility], [address] [place], [place] [address]    Report Status 07/02/2021 FINAL  Final  Resp Panel by RT-PCR (Flu A&B, Covid) Nasopharyngeal Swab     Status: None   Collection Time: 06/27/21 12:42 AM   Specimen: Nasopharyngeal Swab; Nasopharyngeal(NP) swabs in vial transport medium  Result Value Ref Range Status   SARS Coronavirus 2  by RT PCR NEGATIVE NEGATIVE Final    Comment: (NOTE) SARS-CoV-2 target nucleic acids are NOT DETECTED.  The SARS-CoV-2 RNA is generally detectable in upper respiratory specimens during the acute phase of infection. The lowest concentration of SARS-CoV-2 viral copies this assay can detect is 138 copies/mL. A negative result does not preclude SARS-Cov-2 infection and should not be used as the sole basis for treatment or other patient management decisions. A negative result may occur with  improper specimen collection/handling, submission of specimen other than nasopharyngeal swab, presence of viral mutation(s) within the areas targeted by this assay, and inadequate number of viral copies(<138 copies/mL). A negative result must be combined with clinical observations, patient history, and epidemiological information. The expected result is Negative.  Fact Sheet for Patients:  EntrepreneurPulse.com.au  Fact Sheet for Healthcare Providers:  IncredibleEmployment.be  This test is no t yet approved or cleared by the [place] FDA and  has been authorized for detection and/or diagnosis of SARS-CoV-2 by FDA under an Emergency Use Authorization (EUA). This EUA will remain  in effect (meaning this test can be used) for the duration of the COVID-19 declaration under Section 564(b)(1) of the Act, 21 U.S.C.section 360bbb-3(b)(1), unless the authorization is terminated  or revoked sooner.       Influenza A by PCR NEGATIVE NEGATIVE Final   Influenza B by PCR NEGATIVE NEGATIVE Final    Comment: (NOTE) The Xpert Xpress SARS-CoV-2/FLU/RSV plus assay is intended as an aid in the diagnosis of influenza from Nasopharyngeal swab specimens and should not be used as a sole basis for treatment. Nasal washings and aspirates are unacceptable for Xpert Xpress SARS-CoV-2/FLU/RSV testing.  Fact Sheet for Patients: EntrepreneurPulse.com.au  Fact  Sheet for Healthcare Providers: IncredibleEmployment.be  This test is not yet approved or cleared by the [place] FDA and has been authorized for detection and/or diagnosis of SARS-CoV-2 by FDA under an Emergency Use Authorization (EUA). This EUA will remain in effect (meaning this test can be used) for the duration of the COVID-19 declaration under Section 564(b)(1) of the Act, 21 U.S.C. section 360bbb-3(b)(1), unless the authorization is terminated or revoked.  Performed at [facility], [address] [place], [place] [address]   Blood culture (routine x 2)     Status: None   Collection Time: 06/27/21 12:43 AM   Specimen: BLOOD  Result Value Ref Range Status   Specimen Description BLOOD RIGHT FOREARM  Final   Special Requests   Final  BOTTLES DRAWN AEROBIC AND ANAEROBIC Blood Culture adequate volume   Culture   Final    NO GROWTH 5 DAYS Performed at [facility], [address] [place], [place] [address]    Report Status 07/02/2021 FINAL  Final  MRSA Next Gen by PCR, Nasal     Status: None   Collection Time: 06/28/21  5:31 AM   Specimen: Nasal Mucosa; Nasal Swab  Result Value Ref Range Status   MRSA by PCR Next Gen NOT DETECTED NOT DETECTED Final    Comment: (NOTE) The GeneXpert MRSA Assay (FDA approved for NASAL specimens only), is one component of a comprehensive MRSA colonization surveillance program. It is not intended to diagnose MRSA infection nor to guide or monitor treatment for MRSA infections. Test performance is not FDA approved in patients less than 81 years old. Performed at [facility], [address] [place], [place] [address]     Radiology Studies: No results found.     Ariani Seier T. [place]  If 7PM-7AM, please contact night-coverage [email or web address] 07/03/2021, 6:49 PM

## 2021-07-03 NOTE — Progress Notes (Signed)
[facility]  ROUNDING NOTE   Subjective:   Patient seen and evaluated during dialysis   HEMODIALYSIS FLOWSHEET:  Blood Flow Rate (mL/min): 400 mL/min Arterial Pressure (mmHg): -240 mmHg Venous Pressure (mmHg): 170 mmHg Transmembrane Pressure (mmHg): 50 mmHg Ultrafiltration Rate (mL/min): 200 mL/min Dialysate Flow Rate (mL/min): 500 ml/min Conductivity: Machine : 13.8 Conductivity: Machine : 13.8 Dialysis Fluid Bolus: Normal Saline Bolus Amount (mL): 250 mL  No complaints at this time   Objective:  Vital signs in last 24 hours:  Temp:  [97.7 F (36.5 C)-98.6 F (37 C)] 98.4 F (36.9 C) (09/19 0936) Pulse Rate:  [31-75] 67 (09/19 1215) Resp:  [16-23] 17 (09/19 1230) BP: (70-122)/(45-72) 89/47 (09/19 1230) SpO2:  [96 %-100 %] 100 % (09/19 0930)  Weight change:  Filed Weights   06/28/21 1540 06/29/21 0500 06/30/21 0500  Weight: 88.7 kg 92.8 kg 91.4 kg    Intake/Output: I/O last 3 completed shifts: In: 540 [P.O.:240; IV Piggyback:300] Out: -    Intake/Output this shift:  No intake/output data recorded.  Physical Exam: General: In no acute distress  Head: Normocephalic, atraumatic  Eyes: Anicteric  Lungs:  Lungs clear,on 2L O2 via [place]  Heart: Regular rate and rhythm  Abdomen:  Soft, nontender,non distended  Extremities: Dressings intact on Rt.Leg  Neurologic: Oriented to self and place  Skin: No acute rashes or lesions  Access: Rt groin Permcath    Basic Metabolic Panel: Recent Labs  Lab 06/27/21 0611 06/28/21 0311 06/29/21 0536 06/30/21 0730 07/03/21 0527  NA 136 137 135 136  136 135  K 5.1 4.9 4.4 4.1  4.0 4.0  CL 94* 97* 95* 98  96* 99  CO2 '30 25 27 27  27 25  '$ GLUCOSE 296* 91 159* 88  86 237*  BUN 73* 84* 53* 65*  67* 75*  CREATININE 7.31* 8.67* 5.91* 7.45*  7.43* 8.60*  CALCIUM 8.1* 8.3* 8.5* 8.6*  8.6* 8.7*  MG  --   --  2.0 2.3 2.2  PHOS  --   --  4.9* 5.9* 7.0*     Liver Function Tests: Recent Labs  Lab  06/27/21 0042 06/29/21 0536 06/30/21 0730 07/03/21 0527  AST 16  --   --   --   ALT 14  --   --   --   ALKPHOS 110  --   --   --   BILITOT 0.6  --   --   --   PROT 6.6  --   --   --   ALBUMIN 2.4* 2.4* 2.4* 2.3*    No results for input(s): LIPASE, AMYLASE in the last 168 hours. No results for input(s): AMMONIA in the last 168 hours.  CBC: Recent Labs  Lab 06/27/21 0042 06/27/21 0611 06/29/21 0536 06/30/21 0730 07/03/21 0527  WBC 19.6* 19.6* 14.1* 14.5* 17.0*  NEUTROABS 18.6*  --   --   --   --   HGB 9.1* 9.1* 8.5* 9.0* 8.9*  HCT 28.3* 29.1* 25.4* 26.9* 27.1*  MCV 89.3 87.9 87.0 87.6 86.9  PLT 272 267 251 268 307     Cardiac Enzymes: No results for input(s): CKTOTAL, CKMB, CKMBINDEX, TROPONINI in the last 168 hours.  BNP: Invalid input(s): POCBNP  CBG: Recent Labs  Lab 06/28/21 1745 06/29/21 2145 06/30/21 2328 07/02/21 1938 07/03/21 0812  GLUCAP 144* 176* 81 320* 189*     Microbiology: Results for orders placed or performed during the hospital encounter of 06/26/21  Blood culture (routine x  2)     Status: None   Collection Time: 06/27/21 12:42 AM   Specimen: BLOOD  Result Value Ref Range Status   Specimen Description BLOOD RIGHT ASSIST CONTROL  Final   Special Requests   Final    BOTTLES DRAWN AEROBIC AND ANAEROBIC Blood Culture adequate volume   Culture   Final    NO GROWTH 5 DAYS Performed at [facility], [address] [place], [place] [address]    Report Status 07/02/2021 FINAL  Final  Resp Panel by RT-PCR (Flu A&B, Covid) Nasopharyngeal Swab     Status: None   Collection Time: 06/27/21 12:42 AM   Specimen: Nasopharyngeal Swab; Nasopharyngeal(NP) swabs in vial transport medium  Result Value Ref Range Status   SARS Coronavirus 2 by RT PCR NEGATIVE NEGATIVE Final    Comment: (NOTE) SARS-CoV-2 target nucleic acids are NOT DETECTED.  The SARS-CoV-2 RNA is generally detectable in upper respiratory specimens during the acute phase of  infection. The lowest concentration of SARS-CoV-2 viral copies this assay can detect is 138 copies/mL. A negative result does not preclude SARS-Cov-2 infection and should not be used as the sole basis for treatment or other patient management decisions. A negative result may occur with  improper specimen collection/handling, submission of specimen other than nasopharyngeal swab, presence of viral mutation(s) within the areas targeted by this assay, and inadequate number of viral copies(<138 copies/mL). A negative result must be combined with clinical observations, patient history, and epidemiological information. The expected result is Negative.  Fact Sheet for Patients:  EntrepreneurPulse.com.au  Fact Sheet for Healthcare Providers:  IncredibleEmployment.be  This test is no t yet approved or cleared by the [place] FDA and  has been authorized for detection and/or diagnosis of SARS-CoV-2 by FDA under an Emergency Use Authorization (EUA). This EUA will remain  in effect (meaning this test can be used) for the duration of the COVID-19 declaration under Section 564(b)(1) of the Act, 21 U.S.C.section 360bbb-3(b)(1), unless the authorization is terminated  or revoked sooner.       Influenza A by PCR NEGATIVE NEGATIVE Final   Influenza B by PCR NEGATIVE NEGATIVE Final    Comment: (NOTE) The Xpert Xpress SARS-CoV-2/FLU/RSV plus assay is intended as an aid in the diagnosis of influenza from Nasopharyngeal swab specimens and should not be used as a sole basis for treatment. Nasal washings and aspirates are unacceptable for Xpert Xpress SARS-CoV-2/FLU/RSV testing.  Fact Sheet for Patients: EntrepreneurPulse.com.au  Fact Sheet for Healthcare Providers: IncredibleEmployment.be  This test is not yet approved or cleared by the [place] FDA and has been authorized for detection and/or diagnosis of SARS-CoV-2  by FDA under an Emergency Use Authorization (EUA). This EUA will remain in effect (meaning this test can be used) for the duration of the COVID-19 declaration under Section 564(b)(1) of the Act, 21 U.S.C. section 360bbb-3(b)(1), unless the authorization is terminated or revoked.  Performed at [facility], [address] [place], [place] [address]   Blood culture (routine x 2)     Status: None   Collection Time: 06/27/21 12:43 AM   Specimen: BLOOD  Result Value Ref Range Status   Specimen Description BLOOD RIGHT FOREARM  Final   Special Requests   Final    BOTTLES DRAWN AEROBIC AND ANAEROBIC Blood Culture adequate volume   Culture   Final    NO GROWTH 5 DAYS Performed at [facility], [address] [place], [place] [address]    Report Status 07/02/2021  FINAL  Final  MRSA Next Gen by PCR, Nasal     Status: None   Collection Time: 06/28/21  5:31 AM   Specimen: Nasal Mucosa; Nasal Swab  Result Value Ref Range Status   MRSA by PCR Next Gen NOT DETECTED NOT DETECTED Final    Comment: (NOTE) The GeneXpert MRSA Assay (FDA approved for NASAL specimens only), is one component of a comprehensive MRSA colonization surveillance program. It is not intended to diagnose MRSA infection nor to guide or monitor treatment for MRSA infections. Test performance is not FDA approved in patients less than 76 years old. Performed at [facility], [address] [place], [place] [address]     Coagulation Studies: No results for input(s): LABPROT, INR in the last 72 hours.   Urinalysis: No results for input(s): COLORURINE, LABSPEC, PHURINE, GLUCOSEU, HGBUR, BILIRUBINUR, KETONESUR, PROTEINUR, UROBILINOGEN, NITRITE, LEUKOCYTESUR in the last 72 hours.  Invalid input(s): APPERANCEUR    Imaging: No results found.   Medications:    sodium chloride     ceFEPime (MAXIPIME) IV Stopped (07/02/21 1045)   metronidazole 500 mg (07/03/21 0819)   vancomycin 1,000 mg  (07/03/21 1213)    acetaminophen  1,000 mg Oral TID   allopurinol  100 mg Oral Daily   amiodarone  200 mg Oral Daily   aspirin EC  81 mg Oral Daily   atorvastatin  40 mg Oral Daily   Chlorhexidine Gluconate Cloth  6 each Topical Q0600   clopidogrel  75 mg Oral Q breakfast   docusate sodium  100 mg Oral Daily   epoetin (EPOGEN/PROCRIT) injection  10,000 Units Intravenous Q M,W,F-HD   heparin injection (subcutaneous)  5,000 Units Subcutaneous Q8H   insulin aspart  0-5 Units Subcutaneous QHS   insulin aspart  0-6 Units Subcutaneous TID WC   insulin aspart  2 Units Subcutaneous TID WC   insulin detemir  10 Units Subcutaneous QHS   levETIRAcetam  500 mg Oral BID   levETIRAcetam  500 mg Oral Once per day on Mon Wed Fri   mirtazapine  7.5 mg Oral QHS   senna-docusate  1 tablet Oral QHS   sertraline  25 mg Oral QHS   sevelamer carbonate  800 mg Oral TID WC   sodium chloride flush  3 mL Intravenous Q12H   sodium chloride, feeding supplement (NEPRO CARB STEADY), ondansetron **OR** ondansetron (ZOFRAN) IV, oxyCODONE, polyethylene glycol, sodium chloride flush, traZODone  Assessment/ Plan:  Ernest [name] is a 75 y.o.  male with systolic congestive heart failure, COPD, CAD, GERD, Hepatitis C, hypertension, dyslipidemia and end stage renal disease on dialysis, who was admitted to [facility] on 06/26/2021 for Acute respiratory failure with hypoxia ([place]) [J96.01] Sepsis ([place]) [A41.9] Fever, unspecified fever cause [R50.9] Hypervolemia, unspecified hypervolemia type [E87.70] Osteomyelitis of right foot, unspecified type ([place]) [M86.9] Sepsis, due to unspecified organism, unspecified whether acute organ dysfunction present ([place]) [A41.9]  [facility] [address]/MWF/Rt groin Permcath  End stage renal disease on dialysis:  Receiving dialysis today. Became hypotensive during treatment, was given NS bolus and reduced UF goal. Treatment resumed with UF off. Next treatment scheduled for Wednesday.    Anemia of chronic kidney disease  Lab Results  Component Value Date   HGB 8.9 (L) 07/03/2021   Will continue Epogen 10000 units with HD   3. Secondary Hyperparathyroidism:  Lab Results  Component Value Date   PTH 63 10/05/2017   PTH Comment 10/05/2017   CALCIUM 8.7 (L) 07/03/2021   CAION 1.05 (  L) 07/12/2020   PHOS 7.0 (H) 07/03/2021   Phosphorus and calcium not at goal Continue Sevelamer 800 mg TID  4. Hypertension Blood Pressure  87/40 Adjustments made during dialysis. Continuing to monitor   5. Diabetes mellitus type II with chronic kidney disease: insulin dependent.  Lab Results  Component Value Date   HGBA1C 8.4 (H) 07/03/2021    Blood glucose readings within acceptable range with current Insulin regimen   LOS: 6   9/19/202212:57 PM

## 2021-07-03 NOTE — Progress Notes (Signed)
UF back on per doctor.

## 2021-07-03 NOTE — Progress Notes (Signed)
Per doctor UF off and 200 NS given due to low BP.

## 2021-07-04 DIAGNOSIS — I5042 Chronic combined systolic (congestive) and diastolic (congestive) heart failure: Secondary | ICD-10-CM | POA: Diagnosis not present

## 2021-07-04 DIAGNOSIS — N186 End stage renal disease: Secondary | ICD-10-CM | POA: Diagnosis not present

## 2021-07-04 DIAGNOSIS — I739 Peripheral vascular disease, unspecified: Secondary | ICD-10-CM | POA: Diagnosis not present

## 2021-07-04 DIAGNOSIS — E109 Type 1 diabetes mellitus without complications: Secondary | ICD-10-CM | POA: Diagnosis not present

## 2021-07-04 LAB — GLUCOSE, CAPILLARY
Glucose-Capillary: 117 mg/dL — ABNORMAL HIGH (ref 70–99)
Glucose-Capillary: 139 mg/dL — ABNORMAL HIGH (ref 70–99)
Glucose-Capillary: 156 mg/dL — ABNORMAL HIGH (ref 70–99)
Glucose-Capillary: 181 mg/dL — ABNORMAL HIGH (ref 70–99)

## 2021-07-04 NOTE — Progress Notes (Signed)
Occupational Therapy Treatment Patient Details Name: Ernest [name] MRN: [id number] DOB: 1945-12-20 Today's Date: 07/04/2021   History of present illness 75 y.o. male admitted on 06/26/2021 with sepsis.  Pharmacy has been consulted for vancomycin and cefepime dosing. Patient with history of ESRD on HD (MWF). Patient has had a pressure ulcer on the right heel for which she has been receiving oral antibiotics.  The wound has been getting progressively worse and now he has a fever   OT comments  Ernest [name] was seen for OT treatment on this date. Pt received semi-supine in bed. He is notably lethargic and requires consistent cueing to participate in session. Pt significantly limited by cognition this date. He requires MAX A to complete face washing, TOTAL A to doff/don hospital gown. He requires consistent cueing to attend t/o session and open eyes. He is able to re-alert with prompting but quickly closes eyes and disengages with session. Pt left with student nurse in room for evaluation. Pt continues to benefit from skilled OT services to maximize return to PLOF and minimize risk of future falls, injury, caregiver burden, and readmission. Will continue to follow POC. Discharge recommendation remains appropriate.     Recommendations for follow up therapy are one component of a multi-disciplinary discharge planning process, led by the attending physician.  Recommendations may be updated based on patient status, additional functional criteria and insurance authorization.    Follow Up Recommendations  SNF;Supervision/Assistance - 24 hour    Equipment Recommendations  Other (comment) (Defer to next venue of care.)    Recommendations for Other Services      Precautions / Restrictions Precautions Precautions: Fall Restrictions Weight Bearing Restrictions: Yes RLE Weight Bearing: Non weight bearing Other Position/Activity Restrictions: NWB per secure chart with podiatry       Mobility Bed  Mobility Overal bed mobility: Needs Assistance             General bed mobility comments: Deferred for pt safety.    Transfers                 General transfer comment: transfer attempt limited by patient's cognition and inability to avoid attempting to put weight through R LE. Unable to transfer safely at this time.    Balance                                           ADL either performed or assessed with clinical judgement   ADL Overall ADL's : Needs assistance/impaired                                     Functional mobility during ADLs: Total assistance General ADL Comments: Pt significantly limited by cognition this date. He requires MAX A to complete face washing, TOTAL A to doff/don clean gown. He requires consistent cueing to attend t/o session and open eyes. He is able to re-alert with prompting but quickly closes eyes and disengages with session.     Vision Patient Visual Report: No change from baseline     Perception     Praxis      Cognition Arousal/Alertness: Lethargic Behavior During Therapy: Flat affect Overall Cognitive Status: No family/caregiver present to determine baseline cognitive functioning  General Comments: Pt is unable to answer A&O questions today. He is notably more lethargic from past doccumentation of mental status. Primarily only answers yes/no questions. Requires cueing to open eyes consistently t/o session. Nsg aware.        Exercises Other Exercises Other Exercises: OT facilitates bed level grooming and UB ADL management. See ADL section for additional details.   Shoulder Instructions       General Comments      Pertinent Vitals/ Pain       Faces Pain Scale: Hurts a little bit Pain Location: Pt noted to grimace and endorses pain but unable to localize. Monitored t/o session. Pain Descriptors / Indicators: Grimacing Pain Intervention(s):  Limited activity within patient's tolerance;Monitored during session;Repositioned  Home Living                                          Prior Functioning/Environment              Frequency  Min 1X/week        Progress Toward Goals  OT Goals(current goals can now be found in the care plan section)  Progress towards OT goals: Progressing toward goals  Acute Rehab OT Goals Patient Stated Goal: to rest OT Goal Formulation: With patient Time For Goal Achievement: 07/15/21 Potential to Achieve Goals: Good  Plan Discharge plan remains appropriate;Frequency remains appropriate    Co-evaluation                 AM-PAC OT "6 Clicks" Daily Activity     Outcome Measure   Help from another person eating meals?: A Lot Help from another person taking care of personal grooming?: A Lot Help from another person toileting, which includes using toliet, bedpan, or urinal?: Total Help from another person bathing (including washing, rinsing, drying)?: Total Help from another person to put on and taking off regular upper body clothing?: A Lot Help from another person to put on and taking off regular lower body clothing?: Total 6 Click Score: 9    End of Session    OT Visit Diagnosis: Unsteadiness on feet (R26.81);Muscle weakness (generalized) (M62.81)   Activity Tolerance Patient limited by lethargy   Patient Left in bed;with call bell/phone within reach;with bed alarm set   Nurse Communication Mobility status        Time: [id number] OT Time Calculation (min): 16 min  Charges: OT General Charges $OT Visit: 1 Visit OT Treatments $Self Care/Home Management : 8-22 mins  Ernest [name], M.S., OTR/L Ascom: [phone] 07/04/21, 4:11 PM

## 2021-07-04 NOTE — Progress Notes (Signed)
PROGRESS NOTE  Ernest [name] AJG:[id number] DOB: 09-17-1946   PCP: Patient, [name] (Inactive)  Patient is from: SNF  DOA: 06/26/2021 LOS: 7  Chief complaints:  Chief Complaint  Patient presents with   Fever     Brief Narrative / Interim history: 75 year old M with PMH of ESRD on HD MWF, systolic CHF, COPD, CAD, DVT, CVA, hep C, HTN, HLD, GERD and cognitive impairment brought to ED from EMS with fever, chills and worsening right heel decubitus ulcer, and admitted for severe sepsis in the setting infected right heel decubitus ulcer and right heel osteomyelitis.  Started on vancomycin, cefepime and Flagyl.  Nephrology, podiatry and vascular surgery consulted.  MRI right ankle without contrast showed large wound overlying the posterior aspect of the calcaneus with mild subcortical bone marrow edema in the posterior calcaneus most consistent with osteomyelitis, partial-thickness tear of the Achilles tendon along the medial calcaneal insertion and plantar fasciitis.  Patient angiography that showed 70% right common femoral artery stenosis.  Podiatry and vascular surgery recommended BKA but patient's daughter likes to have second opinion.  Remains on broad-spectrum antibiotics  Subjective: Seen and examined earlier this morning.  No major events overnight of this morning.  No complaints but not a great historian.  He denies pain, shortness of breath or GI symptoms.  Objective: Vitals:   07/04/21 0411 07/04/21 0500 07/04/21 0746 07/04/21 1500  BP: (!) 126/58  131/63 (!) 106/52  Pulse: 75  72 70  Resp: '18  16 14  ' Temp: 98 F (36.7 C)  98.2 F (36.8 C) 98.2 F (36.8 C)  TempSrc:   Oral Oral  SpO2: 97%  97% 100%  Weight:  91.1 kg    Height:        Intake/Output Summary (Last 24 hours) at 07/04/2021 1601 Last data filed at 07/04/2021 1351 Gross per 24 hour  Intake 293.05 ml  Output --  Net 293.05 ml   Filed Weights   06/29/21 0500 06/30/21 0500 07/04/21 0500  Weight: 92.8 kg  91.4 kg 91.1 kg    Examination:  GENERAL: No apparent distress.  Nontoxic. HEENT: MMM.  Vision and hearing grossly intact.  NECK: Supple.  No apparent JVD.  RESP: 97% on 3 L.  No IWOB.  Fair aeration bilaterally. CVS:  RRR. Heart sounds normal.  ABD/GI/GU: BS+. Abd soft, NTND.  MSK/EXT:  Moves extremities. No apparent deformity.  Faint DP pulses. SKIN: Right heel laceration/ulceration medially.  No apparent drainage. NEURO: Awake and alert.  Oriented to self and place.  No apparent focal neuro deficit. PSYCH: Calm. Normal affect.   Procedures:  9/14-lower extremity catheterization with 70% stenosis of right common femoral artery  Microbiology summarized: COVID-19 and influenza PCR nonreactive. MRSA PCR screen negative. Blood cultures NGTD.  Assessment & Plan: Severe sepsis due to infected right heel ulcer and calcaneal osteomyelitis: POA.  Meets criteria with leukocytosis, tachypnea and lactic acidosis.  Blood cultures NGTD.  Angiography showed PAD as above.  CRP 26.  ESR 108. -Podiatry and vascular surgery involved -Continue vancomycin, cefepime and Flagyl. -BKA on hold as daughter wound consent until she is here on 9/22. She is flying from another [place].  Peripheral vascular disease: Angiography as above. -Continue aspirin and Plavix -Management per vascular surgery as above  ESRD on HD MWF: Bone mineral disorder -Nephrology managing-on scheduled  Chronic combined CHF-no cardiopulmonary symptoms.  TTE with LVEF of 50 to 55% (previously 45 to 50%), G2 DD.  Appears euvolemic on exam.  -Fluid management  with hemodialysis.  History of CAD/CABG: Stable. -Continue aspirin and atorvastatin  Uncontrolled IDDM-2 with ESRD and neuropathy: A1c 8.4%.  CBG elevated. Recent Labs  Lab 07/03/21 0812 07/03/21 1643 07/03/21 2047 07/04/21 0745 07/04/21 1144  GLUCAP 189* 107* 112* 117* 139*  -Continue SSI-sensitive -Continue NovoLog 10 units 3 times daily with meals -Levemir 10  units nightly.  Hold if CBG less than 110.  Paroxysmal atrial fibrillation: Rate controlled.  Not on anticoagulation.  On aspirin and Plavix -Continue home amiodarone.  History of CVA/vascular dementia: Awake.  Oriented to self and partial place but limited insight.  He thinks he is in the hospital because his roommates tested positive for COVID.  -Continue aspirin and Plavix as above. -Reorientation and delirium precautions.  History of seizures: Stable -Continue Keppra  Depression -Continue home Zoloft and Remeron  Constipation -Bowel regimen  Debility/physical deconditioning -PT/OT -NWB on right lower extremity.  Goal of care counseling: multiple comorbidities as above.  Poor long-term prognosis.  Still full code.  I do think patient comprehends such complaints discussion to make decision.  Patient's daughter, [facility] POA wants him to remain full code -Appreciate help by palliative care  Body mass index is 29.66 kg/m.       Pressure skin injury: POA Pressure Injury 06/26/21 Heel Right Unstageable - Full thickness tissue loss in which the base of the injury is covered by slough (yellow, tan, gray, green or brown) and/or eschar (tan, brown or black) in the wound bed. Pt has unstageable pressure injury (Active)  06/26/21 2319  Location: Heel  Location Orientation: Right  Staging: Unstageable - Full thickness tissue loss in which the base of the injury is covered by slough (yellow, tan, gray, green or brown) and/or eschar (tan, brown or black) in the wound bed.  Wound Description (Comments): Pt has unstageable pressure injury to R lateral heel, necrotic, eschar in base.  Present on Admission: Yes   DVT prophylaxis:  heparin injection 5,000 Units Start: 06/27/21 0600 SCDs Start: 06/27/21 0248  Code Status: Full code Family Communication: Patient and RN.  No family member at bedside. Level of care: Med-Surg Status is: Inpatient  Remains inpatient appropriate because:Ongoing  diagnostic testing needed not appropriate for outpatient work up, IV treatments appropriate due to intensity of illness or inability to take PO, and Inpatient level of care appropriate due to severity of illness  Dispo: The patient is from: SNF              Anticipated d/c is to: SNF              Patient currently is not medically stable to d/c.   Difficult to place patient No       Consultants:  Nephrology Podiatry Vascular surgery Palliative medicine   Sch Meds:  Scheduled Meds:  acetaminophen  1,000 mg Oral TID   allopurinol  100 mg Oral Daily   amiodarone  200 mg Oral Daily   aspirin EC  81 mg Oral Daily   atorvastatin  40 mg Oral Daily   Chlorhexidine Gluconate Cloth  6 each Topical Q0600   clopidogrel  75 mg Oral Q breakfast   docusate sodium  100 mg Oral Daily   epoetin (EPOGEN/PROCRIT) injection  10,000 Units Intravenous Q M,W,F-HD   heparin injection (subcutaneous)  5,000 Units Subcutaneous Q8H   insulin aspart  0-5 Units Subcutaneous QHS   insulin aspart  0-6 Units Subcutaneous TID WC   insulin aspart  2 Units Subcutaneous TID WC  insulin detemir  10 Units Subcutaneous QHS   levETIRAcetam  500 mg Oral BID   levETIRAcetam  500 mg Oral Once per day on Mon Wed Fri   mirtazapine  7.5 mg Oral QHS   senna-docusate  1 tablet Oral QHS   sertraline  25 mg Oral QHS   sevelamer carbonate  800 mg Oral TID WC   sodium chloride flush  3 mL Intravenous Q12H   Continuous Infusions:  sodium chloride     ceFEPime (MAXIPIME) IV 1 g (07/04/21 0926)   vancomycin Stopped (07/03/21 1323)   PRN Meds:.sodium chloride, feeding supplement (NEPRO CARB STEADY), ondansetron **OR** ondansetron (ZOFRAN) IV, oxyCODONE, polyethylene glycol, sodium chloride flush, traZODone  Antimicrobials: Anti-infectives (From admission, onward)    Start     Dose/Rate Route Frequency Ordered Stop   07/01/21 1000  ceFEPIme (MAXIPIME) 1 g in sodium chloride 0.9 % 100 mL IVPB        1 g 200 mL/hr over  30 Minutes Intravenous Every 24 hours 06/30/21 1254     06/30/21 1600  vancomycin (VANCOCIN) IVPB 1000 mg/200 mL premix        1,000 mg 200 mL/hr over 60 Minutes Intravenous Every M-W-F (Hemodialysis) 06/30/21 1254     06/29/21 0800  ceFAZolin (ANCEF) IVPB 1 g/50 mL premix  Status:  Discontinued       Note to Pharmacy: To be given in specials   1 g 100 mL/hr over 30 Minutes Intravenous  Once 06/28/21 1441 06/28/21 1651   06/28/21 1513  ceFAZolin (ANCEF) 1-4 GM/50ML-% IVPB       Note to Pharmacy: Corlis Hove   : cabinet override      06/28/21 1513 06/29/21 0314   06/28/21 1200  vancomycin (VANCOCIN) IVPB 1000 mg/200 mL premix  Status:  Discontinued        1,000 mg 200 mL/hr over 60 Minutes Intravenous Every M-W-F (Hemodialysis) 06/27/21 0959 06/30/21 1254   06/27/21 1100  metroNIDAZOLE (FLAGYL) IVPB 500 mg        500 mg 100 mL/hr over 60 Minutes Intravenous Every 12 hours 06/27/21 0250 07/04/21 1059   06/27/21 1100  vancomycin (VANCOREADY) IVPB 750 mg/150 mL        750 mg 150 mL/hr over 60 Minutes Intravenous  Once 06/27/21 1000 06/27/21 1137   06/27/21 1000  ceFEPIme (MAXIPIME) 1 g in sodium chloride 0.9 % 100 mL IVPB  Status:  Discontinued        1 g 200 mL/hr over 30 Minutes Intravenous  Once 06/27/21 0250 06/27/21 0316   06/27/21 1000  ceFEPIme (MAXIPIME) 1 g in sodium chloride 0.9 % 100 mL IVPB  Status:  Discontinued        1 g 200 mL/hr over 30 Minutes Intravenous Every 24 hours 06/27/21 0316 06/30/21 1254   06/27/21 0336  vancomycin (VANCOCIN) IVPB 1000 mg/200 mL premix  Status:  Discontinued        1,000 mg 200 mL/hr over 60 Minutes Intravenous Every Dialysis 06/27/21 0337 06/27/21 1258   06/27/21 0330  vancomycin (VANCOREADY) IVPB 750 mg/150 mL  Status:  Discontinued        750 mg 150 mL/hr over 60 Minutes Intravenous  Once 06/27/21 0258 06/27/21 0959   06/27/21 0300  vancomycin (VANCOCIN) IVPB 1000 mg/200 mL premix  Status:  Discontinued        1,000 mg 200 mL/hr over  60 Minutes Intravenous  Once 06/27/21 0250 06/27/21 0258   06/27/21 0115  piperacillin-tazobactam (ZOSYN) IVPB 3.375  g        3.375 g 100 mL/hr over 30 Minutes Intravenous  Once 06/27/21 0112 06/27/21 0255   06/26/21 2330  cefTRIAXone (ROCEPHIN) 1 g in sodium chloride 0.9 % 100 mL IVPB  Status:  Discontinued        1 g 200 mL/hr over 30 Minutes Intravenous  Once 06/26/21 2321 06/27/21 0111   06/26/21 2330  vancomycin (VANCOCIN) IVPB 1000 mg/200 mL premix        1,000 mg 200 mL/hr over 60 Minutes Intravenous  Once 06/26/21 2321 06/27/21 0210        I have personally reviewed the following labs and images: CBC: Recent Labs  Lab 06/29/21 0536 06/30/21 0730 07/03/21 0527  WBC 14.1* 14.5* 17.0*  HGB 8.5* 9.0* 8.9*  HCT 25.4* 26.9* 27.1*  MCV 87.0 87.6 86.9  PLT 251 268 307   BMP &GFR Recent Labs  Lab 06/28/21 0311 06/29/21 0536 06/30/21 0730 07/03/21 0527  NA 137 135 136  136 135  K 4.9 4.4 4.1  4.0 4.0  CL 97* 95* 98  96* 99  CO2 '25 27 27  27 25  ' GLUCOSE 91 159* 88  86 237*  BUN 84* 53* 65*  67* 75*  CREATININE 8.67* 5.91* 7.45*  7.43* 8.60*  CALCIUM 8.3* 8.5* 8.6*  8.6* 8.7*  MG  --  2.0 2.3 2.2  PHOS  --  4.9* 5.9* 7.0*   Estimated Creatinine Clearance: 8.7 mL/min (A) (by C-G formula based on SCr of 8.6 mg/dL (H)). Liver & Pancreas: Recent Labs  Lab 06/29/21 0536 06/30/21 0730 07/03/21 0527  ALBUMIN 2.4* 2.4* 2.3*   No results for input(s): LIPASE, AMYLASE in the last 168 hours. No results for input(s): AMMONIA in the last 168 hours. Diabetic: Recent Labs    07/03/21 0527  HGBA1C 8.4*   Recent Labs  Lab 07/03/21 0812 07/03/21 1643 07/03/21 2047 07/04/21 0745 07/04/21 1144  GLUCAP 189* 107* 112* 117* 139*   Cardiac Enzymes: No results for input(s): CKTOTAL, CKMB, CKMBINDEX, TROPONINI in the last 168 hours. No results for input(s): PROBNP in the last 8760 hours. Coagulation Profile: Recent Labs  Lab 06/30/21 0730  INR 1.4*   Thyroid  Function Tests: No results for input(s): TSH, T4TOTAL, FREET4, T3FREE, THYROIDAB in the last 72 hours.  Lipid Profile: No results for input(s): CHOL, HDL, LDLCALC, TRIG, CHOLHDL, LDLDIRECT in the last 72 hours. Anemia Panel: No results for input(s): VITAMINB12, FOLATE, FERRITIN, TIBC, IRON, RETICCTPCT in the last 72 hours. Urine analysis:    Component Value Date/Time   COLORURINE YELLOW 04/29/2015 1702   APPEARANCEUR CLEAR 04/29/2015 1702   LABSPEC 1.016 04/29/2015 1702   PHURINE 7.5 04/29/2015 1702   GLUCOSEU >1000 (A) 04/29/2015 1702   HGBUR SMALL (A) 04/29/2015 1702   BILIRUBINUR NEGATIVE 04/29/2015 1702   KETONESUR NEGATIVE 04/29/2015 1702   PROTEINUR >300 (A) 04/29/2015 1702   UROBILINOGEN 0.2 04/29/2015 1702   NITRITE NEGATIVE 04/29/2015 1702   LEUKOCYTESUR NEGATIVE 04/29/2015 1702   Sepsis Labs: Invalid input(s): PROCALCITONIN, [place]  Microbiology: Recent Results (from the past 240 hour(s))  Blood culture (routine x 2)     Status: None   Collection Time: 06/27/21 12:42 AM   Specimen: BLOOD  Result Value Ref Range Status   Specimen Description BLOOD RIGHT ASSIST CONTROL  Final   Special Requests   Final    BOTTLES DRAWN AEROBIC AND ANAEROBIC Blood Culture adequate volume   Culture   Final    NO GROWTH  5 DAYS Performed at [facility], [address] [place], [place] [address]    Report Status 07/02/2021 FINAL  Final  Resp Panel by RT-PCR (Flu A&B, Covid) Nasopharyngeal Swab     Status: None   Collection Time: 06/27/21 12:42 AM   Specimen: Nasopharyngeal Swab; Nasopharyngeal(NP) swabs in vial transport medium  Result Value Ref Range Status   SARS Coronavirus 2 by RT PCR NEGATIVE NEGATIVE Final    Comment: (NOTE) SARS-CoV-2 target nucleic acids are NOT DETECTED.  The SARS-CoV-2 RNA is generally detectable in upper respiratory specimens during the acute phase of infection. The lowest concentration of SARS-CoV-2 viral copies this assay can detect  is 138 copies/mL. A negative result does not preclude SARS-Cov-2 infection and should not be used as the sole basis for treatment or other patient management decisions. A negative result may occur with  improper specimen collection/handling, submission of specimen other than nasopharyngeal swab, presence of viral mutation(s) within the areas targeted by this assay, and inadequate number of viral copies(<138 copies/mL). A negative result must be combined with clinical observations, patient history, and epidemiological information. The expected result is Negative.  Fact Sheet for Patients:  EntrepreneurPulse.com.au  Fact Sheet for Healthcare Providers:  IncredibleEmployment.be  This test is no t yet approved or cleared by the [place] FDA and  has been authorized for detection and/or diagnosis of SARS-CoV-2 by FDA under an Emergency Use Authorization (EUA). This EUA will remain  in effect (meaning this test can be used) for the duration of the COVID-19 declaration under Section 564(b)(1) of the Act, 21 U.S.C.section 360bbb-3(b)(1), unless the authorization is terminated  or revoked sooner.       Influenza A by PCR NEGATIVE NEGATIVE Final   Influenza B by PCR NEGATIVE NEGATIVE Final    Comment: (NOTE) The Xpert Xpress SARS-CoV-2/FLU/RSV plus assay is intended as an aid in the diagnosis of influenza from Nasopharyngeal swab specimens and should not be used as a sole basis for treatment. Nasal washings and aspirates are unacceptable for Xpert Xpress SARS-CoV-2/FLU/RSV testing.  Fact Sheet for Patients: EntrepreneurPulse.com.au  Fact Sheet for Healthcare Providers: IncredibleEmployment.be  This test is not yet approved or cleared by the [place] FDA and has been authorized for detection and/or diagnosis of SARS-CoV-2 by FDA under an Emergency Use Authorization (EUA). This EUA will remain in effect  (meaning this test can be used) for the duration of the COVID-19 declaration under Section 564(b)(1) of the Act, 21 U.S.C. section 360bbb-3(b)(1), unless the authorization is terminated or revoked.  Performed at [facility], [address] [place], [place] [address]   Blood culture (routine x 2)     Status: None   Collection Time: 06/27/21 12:43 AM   Specimen: BLOOD  Result Value Ref Range Status   Specimen Description BLOOD RIGHT FOREARM  Final   Special Requests   Final    BOTTLES DRAWN AEROBIC AND ANAEROBIC Blood Culture adequate volume   Culture   Final    NO GROWTH 5 DAYS Performed at [facility], [address] [place], [place] [address]    Report Status 07/02/2021 FINAL  Final  MRSA Next Gen by PCR, Nasal     Status: None   Collection Time: 06/28/21  5:31 AM   Specimen: Nasal Mucosa; Nasal Swab  Result Value Ref Range Status   MRSA by PCR Next Gen NOT DETECTED NOT DETECTED Final    Comment: (NOTE) The GeneXpert MRSA Assay (FDA approved for NASAL specimens only),  is one component of a comprehensive MRSA colonization surveillance program. It is not intended to diagnose MRSA infection nor to guide or monitor treatment for MRSA infections. Test performance is not FDA approved in patients less than 72 years old. Performed at [facility], [address], [place], [place] [address]     Radiology Studies: No results found.     Breanah Faddis T. [place]  If 7PM-7AM, please contact night-coverage [email or web address] 07/04/2021, 4:01 PM

## 2021-07-04 NOTE — Progress Notes (Signed)
Physical Therapy Treatment Patient Details Name: Ernest [name] MRN: [id number] DOB: 03-10-1946 Today's Date: 07/04/2021   History of Present Illness 75 y.o. male admitted on 06/26/2021 with sepsis.  Pharmacy has been consulted for vancomycin and cefepime dosing. Patient with history of ESRD on HD (MWF). Patient has had a pressure ulcer on the right heel for which she has been receiving oral antibiotics.  The wound has been getting progressively worse and now he has a fever    PT Comments    Participated in exercises as described below.  Needs mod cues to remain awake during session.  Alternates between helping and resisting with task.  He does agrees to sitting EOB and requires mod a x 1 to get to EOB.  Remains sitting x 5 minutes with BUE support and light min assist before asking to return to supine due to fatigue.  RLE elevated on pillow at end of session for pressure relief.   Recommendations for follow up therapy are one component of a multi-disciplinary discharge planning process, led by the attending physician.  Recommendations may be updated based on patient status, additional functional criteria and insurance authorization.  Follow Up Recommendations  SNF     Equipment Recommendations       Recommendations for Other Services       Precautions / Restrictions Precautions Precautions: Fall Restrictions Weight Bearing Restrictions: Yes RLE Weight Bearing: Non weight bearing Other Position/Activity Restrictions: NWB per secure chart with podiatry     Mobility  Bed Mobility Overal bed mobility: Needs Assistance Bed Mobility: Supine to Sit;Sit to Supine     Supine to sit: Mod assist Sit to supine: Mod assist        Transfers                 General transfer comment: transfer attempt limited by patient's cognition and inability to avoid attempting to put weight through R LE. Unable to transfer safely at this time.  Ambulation/Gait                  Stairs             Wheelchair Mobility    Modified Rankin (Stroke Patients Only)       Balance Overall balance assessment: Needs assistance Sitting-balance support: Bilateral upper extremity supported Sitting balance-Leahy Scale: Poor Sitting balance - Comments: able to sit with support today.  uanble to reach ourside of BOS for any task.  needs UE assist.  fatigues easily Postural control: Posterior lean;Right lateral lean                                  Cognition Arousal/Alertness: Lethargic Behavior During Therapy: Flat affect Overall Cognitive Status: No family/caregiver present to determine baseline cognitive functioning                                        Exercises      General Comments        Pertinent Vitals/Pain Pain Assessment: No/denies pain    Home Living                      Prior Function            PT Goals (current goals can now be found in the care plan section) Progress towards  PT goals: Progressing toward goals    Frequency    Min 2X/week      PT Plan Current plan remains appropriate    Co-evaluation              AM-PAC PT "6 Clicks" Mobility   Outcome Measure  Help needed turning from your back to your side while in a flat bed without using bedrails?: A Lot Help needed moving from lying on your back to sitting on the side of a flat bed without using bedrails?: A Lot Help needed moving to and from a bed to a chair (including a wheelchair)?: Total Help needed standing up from a chair using your arms (e.g., wheelchair or bedside chair)?: Total Help needed to walk in hospital room?: Total Help needed climbing 3-5 steps with a railing? : Total 6 Click Score: 8    End of Session Equipment Utilized During Treatment: Gait belt Activity Tolerance: Patient limited by fatigue Patient left: in bed;with bed alarm set;with SCD's reapplied;with call bell/phone within reach Nurse  Communication: Mobility status PT Visit Diagnosis: Muscle weakness (generalized) (M62.81);Difficulty in walking, not elsewhere classified (R26.2);Other abnormalities of gait and mobility (R26.89);Unsteadiness on feet (R26.81)     Time: 1040-1056 PT Time Calculation (min) (ACUTE ONLY): 16 min  Charges:  $Therapeutic Exercise: 8-22 mins                    Chesley Noon, PTA 07/04/21, 11:42 AM

## 2021-07-04 NOTE — Progress Notes (Signed)
[facility]  ROUNDING NOTE   Subjective:   Patient seen resting in bed Breakfast tray at bedside Denies nausea and vomiting Denies shortness of breath Patient seen later working with PT   Objective:  Vital signs in last 24 hours:  Temp:  [98 F (36.7 C)-98.2 F (36.8 C)] 98.2 F (36.8 C) (09/20 0746) Pulse Rate:  [59-75] 72 (09/20 0746) Resp:  [16-31] 16 (09/20 0746) BP: (87-131)/(40-78) 131/63 (09/20 0746) SpO2:  [97 %-99 %] 97 % (09/20 0746) Weight:  [91.1 kg] 91.1 kg (09/20 0500)  Weight change:  Filed Weights   06/29/21 0500 06/30/21 0500 07/04/21 0500  Weight: 92.8 kg 91.4 kg 91.1 kg    Intake/Output: I/O last 3 completed shifts: In: 433.2 [P.O.:180; IV Piggyback:253.2] Out: 19 [Other:19]   Intake/Output this shift:  No intake/output data recorded.  Physical Exam: General: In no acute distress  Head: Normocephalic, atraumatic  Eyes: Anicteric  Lungs:  Lungs clear,on 2L O2 via [place]  Heart: Regular rate and rhythm  Abdomen:  Soft, nontender,non distended  Extremities: Dressings intact on Rt.Leg  Neurologic: Oriented to self and place  Skin: No acute rashes or lesions  Access: Rt groin Permcath    Basic Metabolic Panel: Recent Labs  Lab 06/28/21 0311 06/29/21 0536 06/30/21 0730 07/03/21 0527  NA 137 135 136  136 135  K 4.9 4.4 4.1  4.0 4.0  CL 97* 95* 98  96* 99  CO2 '25 27 27  27 25  '$ GLUCOSE 91 159* 88  86 237*  BUN 84* 53* 65*  67* 75*  CREATININE 8.67* 5.91* 7.45*  7.43* 8.60*  CALCIUM 8.3* 8.5* 8.6*  8.6* 8.7*  MG  --  2.0 2.3 2.2  PHOS  --  4.9* 5.9* 7.0*     Liver Function Tests: Recent Labs  Lab 06/29/21 0536 06/30/21 0730 07/03/21 0527  ALBUMIN 2.4* 2.4* 2.3*    No results for input(s): LIPASE, AMYLASE in the last 168 hours. No results for input(s): AMMONIA in the last 168 hours.  CBC: Recent Labs  Lab 06/29/21 0536 06/30/21 0730 07/03/21 0527  WBC 14.1* 14.5* 17.0*  HGB 8.5* 9.0* 8.9*  HCT 25.4*  26.9* 27.1*  MCV 87.0 87.6 86.9  PLT 251 268 307     Cardiac Enzymes: No results for input(s): CKTOTAL, CKMB, CKMBINDEX, TROPONINI in the last 168 hours.  BNP: Invalid input(s): POCBNP  CBG: Recent Labs  Lab 07/03/21 0812 07/03/21 1643 07/03/21 2047 07/04/21 0745 07/04/21 1144  GLUCAP 189* 107* 112* 117* 139*     Microbiology: Results for orders placed or performed during the hospital encounter of 06/26/21  Blood culture (routine x 2)     Status: None   Collection Time: 06/27/21 12:42 AM   Specimen: BLOOD  Result Value Ref Range Status   Specimen Description BLOOD RIGHT ASSIST CONTROL  Final   Special Requests   Final    BOTTLES DRAWN AEROBIC AND ANAEROBIC Blood Culture adequate volume   Culture   Final    NO GROWTH 5 DAYS Performed at [facility], [address], [place], [place] [address]    Report Status 07/02/2021 FINAL  Final  Resp Panel by RT-PCR (Flu A&B, Covid) Nasopharyngeal Swab     Status: None   Collection Time: 06/27/21 12:42 AM   Specimen: Nasopharyngeal Swab; Nasopharyngeal(NP) swabs in vial transport medium  Result Value Ref Range Status   SARS Coronavirus 2 by RT PCR NEGATIVE NEGATIVE Final    Comment: (NOTE) SARS-CoV-2  target nucleic acids are NOT DETECTED.  The SARS-CoV-2 RNA is generally detectable in upper respiratory specimens during the acute phase of infection. The lowest concentration of SARS-CoV-2 viral copies this assay can detect is 138 copies/mL. A negative result does not preclude SARS-Cov-2 infection and should not be used as the sole basis for treatment or other patient management decisions. A negative result may occur with  improper specimen collection/handling, submission of specimen other than nasopharyngeal swab, presence of viral mutation(s) within the areas targeted by this assay, and inadequate number of viral copies(<138 copies/mL). A negative result must be combined with clinical observations, patient history,  and epidemiological information. The expected result is Negative.  Fact Sheet for Patients:  EntrepreneurPulse.com.au  Fact Sheet for Healthcare Providers:  IncredibleEmployment.be  This test is no t yet approved or cleared by the [place] FDA and  has been authorized for detection and/or diagnosis of SARS-CoV-2 by FDA under an Emergency Use Authorization (EUA). This EUA will remain  in effect (meaning this test can be used) for the duration of the COVID-19 declaration under Section 564(b)(1) of the Act, 21 U.S.C.section 360bbb-3(b)(1), unless the authorization is terminated  or revoked sooner.       Influenza A by PCR NEGATIVE NEGATIVE Final   Influenza B by PCR NEGATIVE NEGATIVE Final    Comment: (NOTE) The Xpert Xpress SARS-CoV-2/FLU/RSV plus assay is intended as an aid in the diagnosis of influenza from Nasopharyngeal swab specimens and should not be used as a sole basis for treatment. Nasal washings and aspirates are unacceptable for Xpert Xpress SARS-CoV-2/FLU/RSV testing.  Fact Sheet for Patients: EntrepreneurPulse.com.au  Fact Sheet for Healthcare Providers: IncredibleEmployment.be  This test is not yet approved or cleared by the [place] FDA and has been authorized for detection and/or diagnosis of SARS-CoV-2 by FDA under an Emergency Use Authorization (EUA). This EUA will remain in effect (meaning this test can be used) for the duration of the COVID-19 declaration under Section 564(b)(1) of the Act, 21 U.S.C. section 360bbb-3(b)(1), unless the authorization is terminated or revoked.  Performed at [facility], [address] [place], [place] [address]   Blood culture (routine x 2)     Status: None   Collection Time: 06/27/21 12:43 AM   Specimen: BLOOD  Result Value Ref Range Status   Specimen Description BLOOD RIGHT FOREARM  Final   Special Requests   Final     BOTTLES DRAWN AEROBIC AND ANAEROBIC Blood Culture adequate volume   Culture   Final    NO GROWTH 5 DAYS Performed at [facility], [address] [place], [place] [address]    Report Status 07/02/2021 FINAL  Final  MRSA Next Gen by PCR, Nasal     Status: None   Collection Time: 06/28/21  5:31 AM   Specimen: Nasal Mucosa; Nasal Swab  Result Value Ref Range Status   MRSA by PCR Next Gen NOT DETECTED NOT DETECTED Final    Comment: (NOTE) The GeneXpert MRSA Assay (FDA approved for NASAL specimens only), is one component of a comprehensive MRSA colonization surveillance program. It is not intended to diagnose MRSA infection nor to guide or monitor treatment for MRSA infections. Test performance is not FDA approved in patients less than 47 years old. Performed at [facility], [address] [place], [place] [address]     Coagulation Studies: No results for input(s): LABPROT, INR in the last 72 hours.   Urinalysis: No results for input(s): COLORURINE, LABSPEC, [place], [place],  HGBUR, BILIRUBINUR, KETONESUR, PROTEINUR, UROBILINOGEN, NITRITE, LEUKOCYTESUR in the last 72 hours.  Invalid input(s): APPERANCEUR    Imaging: No results found.   Medications:    sodium chloride     ceFEPime (MAXIPIME) IV 1 g (07/04/21 0926)   vancomycin Stopped (07/03/21 1323)    acetaminophen  1,000 mg Oral TID   allopurinol  100 mg Oral Daily   amiodarone  200 mg Oral Daily   aspirin EC  81 mg Oral Daily   atorvastatin  40 mg Oral Daily   Chlorhexidine Gluconate Cloth  6 each Topical Q0600   clopidogrel  75 mg Oral Q breakfast   docusate sodium  100 mg Oral Daily   epoetin (EPOGEN/PROCRIT) injection  10,000 Units Intravenous Q M,W,F-HD   heparin injection (subcutaneous)  5,000 Units Subcutaneous Q8H   insulin aspart  0-5 Units Subcutaneous QHS   insulin aspart  0-6 Units Subcutaneous TID WC   insulin aspart  2 Units Subcutaneous TID WC   insulin detemir  10 Units  Subcutaneous QHS   levETIRAcetam  500 mg Oral BID   levETIRAcetam  500 mg Oral Once per day on Mon Wed Fri   mirtazapine  7.5 mg Oral QHS   senna-docusate  1 tablet Oral QHS   sertraline  25 mg Oral QHS   sevelamer carbonate  800 mg Oral TID WC   sodium chloride flush  3 mL Intravenous Q12H   sodium chloride, feeding supplement (NEPRO CARB STEADY), ondansetron **OR** ondansetron (ZOFRAN) IV, oxyCODONE, polyethylene glycol, sodium chloride flush, traZODone  Assessment/ Plan:  Ernest [name] is a 75 y.o.  male with systolic congestive heart failure, COPD, CAD, GERD, Hepatitis C, hypertension, dyslipidemia and end stage renal disease on dialysis, who was admitted to [facility] on 06/26/2021 for Acute respiratory failure with hypoxia ([place]) [J96.01] Sepsis ([place]) [A41.9] Fever, unspecified fever cause [R50.9] Hypervolemia, unspecified hypervolemia type [E87.70] Osteomyelitis of right foot, unspecified type ([place]) [M86.9] Sepsis, due to unspecified organism, unspecified whether acute organ dysfunction present ([place]) [A41.9]  [facility] [address]/MWF/Rt groin Permcath  End stage renal disease on dialysis:  Received dialysis yesterday, UF turned off due to hypotension during treatment. Next treatment scheduled for Wednesday.   Anemia of chronic kidney disease  Lab Results  Component Value Date   HGB 8.9 (L) 07/03/2021   Continue Epogen 10000 units with HD   3. Secondary Hyperparathyroidism:  Lab Results  Component Value Date   PTH 63 10/05/2017   PTH Comment 10/05/2017   CALCIUM 8.7 (L) 07/03/2021   CAION 1.05 (L) 07/12/2020   PHOS 7.0 (H) 07/03/2021   Phosphorus and calcium not at goal Continue Sevelamer 800 mg TID  4. Hypertension Blood Pressure  131/63   5. Diabetes mellitus type II with chronic kidney disease: insulin dependent.  Lab Results  Component Value Date   HGBA1C 8.4 (H) 07/03/2021    Blood glucose readings within acceptable range with current Insulin regimen    LOS: 7   9/20/202212:15 PM

## 2021-07-04 NOTE — TOC Progression Note (Signed)
Transition of Care [facility]) - Progression Note    Patient Details  Name: Ernest [name] MRN: [id number] Date of Birth: [birth year]  Transition of Care [facility]) CM/SW Contact  Beverly Sessions, RN Phone Number: 07/04/2021, 1:28 PM  Clinical Narrative:     Per progression plan to daughter to arrive Wednesday and it will be determined if patient will proceed with surgery   Expected Discharge Plan: Skilled Nursing Facility Barriers to Discharge: Continued Medical Work up  Expected Discharge Plan and Services Expected Discharge Plan: [place] In-house Referral: Clinical Social Work   Post Acute Care Choice: [place] Living arrangements for the past 2 months: [place]                                       Social Determinants of Health (SDOH) Interventions    Readmission Risk Interventions Readmission Risk Prevention Plan 06/28/2021  Transportation Screening Complete  Medication Review Press photographer) Complete  PCP or Specialist appointment within 3-5 days of discharge Complete  HRI or Home Care Consult Complete  SW Recovery Care/Counseling Consult Complete  [place] Complete  Some recent data might be hidden

## 2021-07-05 DIAGNOSIS — A419 Sepsis, unspecified organism: Secondary | ICD-10-CM | POA: Diagnosis not present

## 2021-07-05 LAB — BASIC METABOLIC PANEL
Anion gap: 13 (ref 5–15)
BUN: 25 mg/dL — ABNORMAL HIGH (ref 8–23)
CO2: 27 mmol/L (ref 22–32)
Calcium: 8.7 mg/dL — ABNORMAL LOW (ref 8.9–10.3)
Chloride: 96 mmol/L — ABNORMAL LOW (ref 98–111)
Creatinine, Ser: 3.31 mg/dL — ABNORMAL HIGH (ref 0.61–1.24)
GFR, Estimated: 19 mL/min — ABNORMAL LOW (ref >60)
Glucose, Bld: 171 mg/dL — ABNORMAL HIGH (ref 70–99)
Potassium: 3.1 mmol/L — ABNORMAL LOW (ref 3.5–5.1)
Sodium: 136 mmol/L (ref 135–145)

## 2021-07-05 LAB — CBC
HCT: 28.6 % — ABNORMAL LOW (ref 39.0–52.0)
Hemoglobin: 9.4 g/dL — ABNORMAL LOW (ref 13.0–17.0)
MCH: 28.7 pg (ref 26.0–34.0)
MCHC: 32.9 g/dL (ref 30.0–36.0)
MCV: 87.2 fL (ref 80.0–100.0)
Platelets: 340 10*3/uL (ref 150–400)
RBC: 3.28 MIL/uL — ABNORMAL LOW (ref 4.22–5.81)
RDW: 16.1 % — ABNORMAL HIGH (ref 11.5–15.5)
WBC: 22.2 10*3/uL — ABNORMAL HIGH (ref 4.0–10.5)
nRBC: 0.1 % (ref 0.0–0.2)

## 2021-07-05 LAB — GLUCOSE, CAPILLARY
Glucose-Capillary: 179 mg/dL — ABNORMAL HIGH (ref 70–99)
Glucose-Capillary: 202 mg/dL — ABNORMAL HIGH (ref 70–99)
Glucose-Capillary: 182 mg/dL — ABNORMAL HIGH (ref 70–99)
Glucose-Capillary: 197 mg/dL — ABNORMAL HIGH (ref 70–99)

## 2021-07-05 LAB — PHOSPHORUS: Phosphorus: 3.2 mg/dL (ref 2.5–4.6)

## 2021-07-05 NOTE — Progress Notes (Signed)
Per doctor UF turned off for remainder of tx due to low BP.

## 2021-07-05 NOTE — Progress Notes (Signed)
Spoke with Anderson Malta in central telemetry at 1216 to transfer patient from room 226 to [place]

## 2021-07-05 NOTE — Progress Notes (Signed)
Patient tolerated tx well, only completed a UF net goal of 321 due to low BP. Patient was resting with eyes closed upon his departure from [facility].

## 2021-07-05 NOTE — Progress Notes (Addendum)
     Referral received for Ernest [name] regarding goals of care discussion. Chart reviewed. Daughter planning to not make decision on BKA until she arrives ([place]ives [place] of state), anticipated arrival tomorrow.  Attempted to contact patient's daughter who is expected to arrive locally tomorrow, in order to set-up a family meeting. Unable to reach, would not allow leaving a message.   PMT will re-attempt to contact family at a later time/date. Detailed note and recommendations to follow once GOC has been completed.   Thank you for your referral and allowing PMT to assist in [place] care.   Walden Field, NP Palliative Medicine Team Phone: [phone]  NO CHARGE

## 2021-07-05 NOTE — Progress Notes (Addendum)
PROGRESS NOTE  Ernest [name] [id number] DOB: Feb 15, 1946   PCP: Patient, [name] (Inactive)  Patient is from: SNF  DOA: 06/26/2021 LOS: 8  Chief complaints:  Chief Complaint  Patient presents with   Fever     Brief Narrative / Interim history: 75 year old M with PMH of ESRD on HD MWF, systolic CHF, COPD, CAD, DVT, CVA, hep C, HTN, HLD, GERD and cognitive impairment brought to ED from EMS with fever, chills and worsening right heel decubitus ulcer, and admitted for severe sepsis in the setting infected right heel decubitus ulcer and right heel osteomyelitis.  Started on vancomycin, cefepime and Flagyl.  Nephrology, podiatry and vascular surgery consulted.  MRI right ankle without contrast showed large wound overlying the posterior aspect of the calcaneus with mild subcortical bone marrow edema in the posterior calcaneus most consistent with osteomyelitis, partial-thickness tear of the Achilles tendon along the medial calcaneal insertion and plantar fasciitis.  Patient angiography that showed 70% right common femoral artery stenosis.  Podiatry and vascular surgery recommended BKA but patient's daughter likes to have second opinion.  Remains on broad-spectrum antibiotics     Assessment & Plan: Sepsis due to infected right heel ulcer with calcaneal osteomyelitis   Patient presented with tachypnea, leukocytosis, lactic acidosis.  Source right calcaneal osteomyelitis.  Severe sepsis ruled out.   Vascular surgery consulted and angiography showed PAD - Continue IV antibiotics, vancomycin and cefepime - Consult vascular surgery   Gout - Continue allopurinol  Paroxysmal atrial fibrillation Not on anticoagulation at baseline - Continue amiodarone     Peripheral vascular disease Artery disease status post CABG -Continue aspirin and Plavix and atorvastatin  ESRD -Consult nephrology for maintenance dialysis  Chronic systolic and diastolic CHF  Is euvolemic on exam  Type 2  diabetes, insulin-dependent with nephropathy A1c 8.4%, glucose normal. - Continue sliding scale corrections, Levemir, and mealtime insulin     Vascular dementia Depression - Continue Zoloft and Remeron  Seizures No seizures here - Continue Keppra     Pressure skin injury: POA Pressure Injury 06/26/21 Heel Right Unstageable - Full thickness tissue loss in which the base of the injury is covered by slough (yellow, tan, gray, green or brown) and/or eschar (tan, brown or black) in the wound bed. Pt has unstageable pressure injury (Active)  06/26/21 2319  Location: Heel  Location Orientation: Right  Staging: Unstageable - Full thickness tissue loss in which the base of the injury is covered by slough (yellow, tan, gray, green or brown) and/or eschar (tan, brown or black) in the wound bed.  Wound Description (Comments): Pt has unstageable pressure injury to R lateral heel, necrotic, eschar in base.  Present on Admission: Yes          Subjective: No increasing pain.  No headache, chest pain, dyspnea, swelling.  Objective: Vitals:   07/05/21 1515 07/05/21 1530 07/05/21 1545 07/05/21 1600  BP: (!) 90/44 (!) 95/41 (!) 95/43   Pulse:      Resp: '17 17 18 15  '$ Temp:      TempSrc:      SpO2:      Weight:      Height:        Intake/Output Summary (Last 24 hours) at 07/05/2021 1637 Last data filed at 07/05/2021 1545 Gross per 24 hour  Intake --  Output 321 ml  Net -321 ml   Filed Weights   06/30/21 0500 07/04/21 0500 07/05/21 0500  Weight: 91.4 kg 91.1 kg 88.8 kg  Examination:  General appearance: Elderly adult male, lying in bed, no acute distress     HEENT:    Skin:  Cardiac: RRR, no murmurs, no lower extremity edema, no JVD Respiratory: Normal respiratory rate and rhythm, lungs clear without rales or wheezes Abdomen: Soft without tenderness palpation or guarding, no ascites or distention MSK: Right leg is wrapped, has some mild edema around it, large right heel  ulcer. Neuro: Sleepy, mostly does not follow commands, lifts arms symmetrically but weakly.  Face symmetric, speech fluent but sparse Psych: Attention diminished, affect blunted, judgment insight appear impaired by dementia   Procedures:  9/14-lower extremity catheterization with 70% stenosis of right common femoral artery  Microbiology summarized: COVID-19 and influenza PCR nonreactive. MRSA PCR screen negative. Blood cultures NGTD. DVT prophylaxis:  heparin injection 5,000 Units Start: 06/27/21 0600 SCDs Start: 06/27/21 0248  Code Status: Full code Family Communication: Patient and RN.  No family member at bedside. Level of care: Med-Surg Status is: Inpatient  Remains inpatient appropriate because:Ongoing diagnostic testing needed not appropriate for outpatient work up, IV treatments appropriate due to intensity of illness or inability to take PO, and Inpatient level of care appropriate due to severity of illness  Dispo: The patient is from: SNF              Anticipated d/c is to: SNF              Patient currently is not medically stable to d/c.   Difficult to place patient No       Consultants:  Nephrology Podiatry Vascular surgery Palliative medicine   Sch Meds:  Scheduled Meds:  acetaminophen  1,000 mg Oral TID   allopurinol  100 mg Oral Daily   amiodarone  200 mg Oral Daily   aspirin EC  81 mg Oral Daily   atorvastatin  40 mg Oral Daily   Chlorhexidine Gluconate Cloth  6 each Topical Q0600   clopidogrel  75 mg Oral Q breakfast   docusate sodium  100 mg Oral Daily   epoetin (EPOGEN/PROCRIT) injection  10,000 Units Intravenous Q M,W,F-HD   heparin injection (subcutaneous)  5,000 Units Subcutaneous Q8H   insulin aspart  0-5 Units Subcutaneous QHS   insulin aspart  0-6 Units Subcutaneous TID WC   insulin aspart  2 Units Subcutaneous TID WC   insulin detemir  10 Units Subcutaneous QHS   levETIRAcetam  500 mg Oral BID   levETIRAcetam  500 mg Oral Once per day  on Mon Wed Fri   mirtazapine  7.5 mg Oral QHS   senna-docusate  1 tablet Oral QHS   sertraline  25 mg Oral QHS   sevelamer carbonate  800 mg Oral TID WC   sodium chloride flush  3 mL Intravenous Q12H   Continuous Infusions:  sodium chloride     ceFEPime (MAXIPIME) IV 1 g (07/05/21 0818)   vancomycin 1,000 mg (07/05/21 1631)   PRN Meds:.sodium chloride, feeding supplement (NEPRO CARB STEADY), ondansetron **OR** ondansetron (ZOFRAN) IV, oxyCODONE, polyethylene glycol, sodium chloride flush, traZODone  Antimicrobials: Anti-infectives (From admission, onward)    Start     Dose/Rate Route Frequency Ordered Stop   07/01/21 1000  ceFEPIme (MAXIPIME) 1 g in sodium chloride 0.9 % 100 mL IVPB        1 g 200 mL/hr over 30 Minutes Intravenous Every 24 hours 06/30/21 1254     06/30/21 1600  vancomycin (VANCOCIN) IVPB 1000 mg/200 mL premix        1,000  mg 200 mL/hr over 60 Minutes Intravenous Every M-W-F (Hemodialysis) 06/30/21 1254     06/29/21 0800  ceFAZolin (ANCEF) IVPB 1 g/50 mL premix  Status:  Discontinued       Note to Pharmacy: To be given in specials   1 g 100 mL/hr over 30 Minutes Intravenous  Once 06/28/21 1441 06/28/21 1651   06/28/21 1513  ceFAZolin (ANCEF) 1-4 GM/50ML-% IVPB       Note to Pharmacy: Corlis Hove   : cabinet override      06/28/21 1513 06/29/21 0314   06/28/21 1200  vancomycin (VANCOCIN) IVPB 1000 mg/200 mL premix  Status:  Discontinued        1,000 mg 200 mL/hr over 60 Minutes Intravenous Every M-W-F (Hemodialysis) 06/27/21 0959 06/30/21 1254   06/27/21 1100  metroNIDAZOLE (FLAGYL) IVPB 500 mg        500 mg 100 mL/hr over 60 Minutes Intravenous Every 12 hours 06/27/21 0250 07/04/21 1059   06/27/21 1100  vancomycin (VANCOREADY) IVPB 750 mg/150 mL        750 mg 150 mL/hr over 60 Minutes Intravenous  Once 06/27/21 1000 06/27/21 1137   06/27/21 1000  ceFEPIme (MAXIPIME) 1 g in sodium chloride 0.9 % 100 mL IVPB  Status:  Discontinued        1 g 200 mL/hr over  30 Minutes Intravenous  Once 06/27/21 0250 06/27/21 0316   06/27/21 1000  ceFEPIme (MAXIPIME) 1 g in sodium chloride 0.9 % 100 mL IVPB  Status:  Discontinued        1 g 200 mL/hr over 30 Minutes Intravenous Every 24 hours 06/27/21 0316 06/30/21 1254   06/27/21 0336  vancomycin (VANCOCIN) IVPB 1000 mg/200 mL premix  Status:  Discontinued        1,000 mg 200 mL/hr over 60 Minutes Intravenous Every Dialysis 06/27/21 0337 06/27/21 1258   06/27/21 0330  vancomycin (VANCOREADY) IVPB 750 mg/150 mL  Status:  Discontinued        750 mg 150 mL/hr over 60 Minutes Intravenous  Once 06/27/21 0258 06/27/21 0959   06/27/21 0300  vancomycin (VANCOCIN) IVPB 1000 mg/200 mL premix  Status:  Discontinued        1,000 mg 200 mL/hr over 60 Minutes Intravenous  Once 06/27/21 0250 06/27/21 0258   06/27/21 0115  piperacillin-tazobactam (ZOSYN) IVPB 3.375 g        3.375 g 100 mL/hr over 30 Minutes Intravenous  Once 06/27/21 0112 06/27/21 0255   06/26/21 2330  cefTRIAXone (ROCEPHIN) 1 g in sodium chloride 0.9 % 100 mL IVPB  Status:  Discontinued        1 g 200 mL/hr over 30 Minutes Intravenous  Once 06/26/21 2321 06/27/21 0111   06/26/21 2330  vancomycin (VANCOCIN) IVPB 1000 mg/200 mL premix        1,000 mg 200 mL/hr over 60 Minutes Intravenous  Once 06/26/21 2321 06/27/21 0210        I have personally reviewed the following labs and images: CBC: Recent Labs  Lab 06/29/21 0536 06/30/21 0730 07/03/21 0527 07/05/21 1442  WBC 14.1* 14.5* 17.0* 22.2*  HGB 8.5* 9.0* 8.9* 9.4*  HCT 25.4* 26.9* 27.1* 28.6*  MCV 87.0 87.6 86.9 87.2  PLT 251 268 307 340   BMP &GFR Recent Labs  Lab 06/29/21 0536 06/30/21 0730 07/03/21 0527 07/05/21 1442  NA 135 136  136 135 136  K 4.4 4.1  4.0 4.0 3.1*  CL 95* 98  96* 99 96*  CO2  $'27 27  27 25 27  'k$ GLUCOSE 159* 88  86 237* 171*  BUN 53* 65*  67* 75* 25*  CREATININE 5.91* 7.45*  7.43* 8.60* 3.31*  CALCIUM 8.5* 8.6*  8.6* 8.7* 8.7*  MG 2.0 2.3 2.2  --   PHOS  4.9* 5.9* 7.0* 3.2   Estimated Creatinine Clearance: 22.2 mL/min (A) (by C-G formula based on SCr of 3.31 mg/dL (H)). Liver & Pancreas: Recent Labs  Lab 06/29/21 0536 06/30/21 0730 07/03/21 0527  ALBUMIN 2.4* 2.4* 2.3*   No results for input(s): LIPASE, AMYLASE in the last 168 hours. No results for input(s): AMMONIA in the last 168 hours. Diabetic: Recent Labs    07/03/21 0527  HGBA1C 8.4*   Recent Labs  Lab 07/04/21 1654 07/04/21 2024 07/05/21 0738 07/05/21 1201 07/05/21 1616  GLUCAP 156* 181* 179* 202* 182*   Cardiac Enzymes: No results for input(s): CKTOTAL, CKMB, CKMBINDEX, TROPONINI in the last 168 hours. No results for input(s): PROBNP in the last 8760 hours. Coagulation Profile: Recent Labs  Lab 06/30/21 0730  INR 1.4*   Thyroid Function Tests: No results for input(s): TSH, T4TOTAL, FREET4, T3FREE, THYROIDAB in the last 72 hours.  Lipid Profile: No results for input(s): CHOL, HDL, LDLCALC, TRIG, CHOLHDL, LDLDIRECT in the last 72 hours. Anemia Panel: No results for input(s): VITAMINB12, FOLATE, FERRITIN, TIBC, IRON, RETICCTPCT in the last 72 hours. Urine analysis:    Component Value Date/Time   COLORURINE YELLOW 04/29/2015 1702   APPEARANCEUR CLEAR 04/29/2015 1702   LABSPEC 1.016 04/29/2015 1702   PHURINE 7.5 04/29/2015 1702   GLUCOSEU >1000 (A) 04/29/2015 1702   HGBUR SMALL (A) 04/29/2015 1702   BILIRUBINUR NEGATIVE 04/29/2015 1702   KETONESUR NEGATIVE 04/29/2015 1702   PROTEINUR >300 (A) 04/29/2015 1702   UROBILINOGEN 0.2 04/29/2015 1702   NITRITE NEGATIVE 04/29/2015 1702   LEUKOCYTESUR NEGATIVE 04/29/2015 1702   Sepsis Labs: Invalid input(s): PROCALCITONIN, [place]  Microbiology: Recent Results (from the past 240 hour(s))  Blood culture (routine x 2)     Status: None   Collection Time: 06/27/21 12:42 AM   Specimen: BLOOD  Result Value Ref Range Status   Specimen Description BLOOD RIGHT ASSIST CONTROL  Final   Special Requests   Final     BOTTLES DRAWN AEROBIC AND ANAEROBIC Blood Culture adequate volume   Culture   Final    NO GROWTH 5 DAYS Performed at [facility], [address] [place], [place] [address]    Report Status 07/02/2021 FINAL  Final  Resp Panel by RT-PCR (Flu A&B, Covid) Nasopharyngeal Swab     Status: None   Collection Time: 06/27/21 12:42 AM   Specimen: Nasopharyngeal Swab; Nasopharyngeal(NP) swabs in vial transport medium  Result Value Ref Range Status   SARS Coronavirus 2 by RT PCR NEGATIVE NEGATIVE Final    Comment: (NOTE) SARS-CoV-2 target nucleic acids are NOT DETECTED.  The SARS-CoV-2 RNA is generally detectable in upper respiratory specimens during the acute phase of infection. The lowest concentration of SARS-CoV-2 viral copies this assay can detect is 138 copies/mL. A negative result does not preclude SARS-Cov-2 infection and should not be used as the sole basis for treatment or other patient management decisions. A negative result may occur with  improper specimen collection/handling, submission of specimen other than nasopharyngeal swab, presence of viral mutation(s) within the areas targeted by this assay, and inadequate number of viral copies(<138 copies/mL). A negative result must be combined with clinical observations, patient history, and epidemiological information. The expected  result is Negative.  Fact Sheet for Patients:  EntrepreneurPulse.com.au  Fact Sheet for Healthcare Providers:  IncredibleEmployment.be  This test is no t yet approved or cleared by the [place] FDA and  has been authorized for detection and/or diagnosis of SARS-CoV-2 by FDA under an Emergency Use Authorization (EUA). This EUA will remain  in effect (meaning this test can be used) for the duration of the COVID-19 declaration under Section 564(b)(1) of the Act, 21 U.S.C.section 360bbb-3(b)(1), unless the authorization is terminated  or revoked sooner.        Influenza A by PCR NEGATIVE NEGATIVE Final   Influenza B by PCR NEGATIVE NEGATIVE Final    Comment: (NOTE) The Xpert Xpress SARS-CoV-2/FLU/RSV plus assay is intended as an aid in the diagnosis of influenza from Nasopharyngeal swab specimens and should not be used as a sole basis for treatment. Nasal washings and aspirates are unacceptable for Xpert Xpress SARS-CoV-2/FLU/RSV testing.  Fact Sheet for Patients: EntrepreneurPulse.com.au  Fact Sheet for Healthcare Providers: IncredibleEmployment.be  This test is not yet approved or cleared by the [place] FDA and has been authorized for detection and/or diagnosis of SARS-CoV-2 by FDA under an Emergency Use Authorization (EUA). This EUA will remain in effect (meaning this test can be used) for the duration of the COVID-19 declaration under Section 564(b)(1) of the Act, 21 U.S.C. section 360bbb-3(b)(1), unless the authorization is terminated or revoked.  Performed at [facility], [address] [place], [place] [address]   Blood culture (routine x 2)     Status: None   Collection Time: 06/27/21 12:43 AM   Specimen: BLOOD  Result Value Ref Range Status   Specimen Description BLOOD RIGHT FOREARM  Final   Special Requests   Final    BOTTLES DRAWN AEROBIC AND ANAEROBIC Blood Culture adequate volume   Culture   Final    NO GROWTH 5 DAYS Performed at [facility], [address] [place], [place] [address]    Report Status 07/02/2021 FINAL  Final  MRSA Next Gen by PCR, Nasal     Status: None   Collection Time: 06/28/21  5:31 AM   Specimen: Nasal Mucosa; Nasal Swab  Result Value Ref Range Status   MRSA by PCR Next Gen NOT DETECTED NOT DETECTED Final    Comment: (NOTE) The GeneXpert MRSA Assay (FDA approved for NASAL specimens only), is one component of a comprehensive MRSA colonization surveillance program. It is not intended to diagnose MRSA infection nor to  guide or monitor treatment for MRSA infections. Test performance is not FDA approved in patients less than 22 years old. Performed at [facility], [address] [place], [place] [address]     Radiology Studies: No results found.     Myrene Buddy, MD [facility]  If 7PM-7AM, please contact night-coverage [email or web address] 07/05/2021, 4:37 PM

## 2021-07-05 NOTE — Progress Notes (Signed)
[facility]  ROUNDING NOTE   Subjective:   Patient seen and evaluated this morning Breakfast tray at bedside States he feels well today Dialysis scheduled today  Objective:  Vital signs in last 24 hours:  Temp:  [97.8 F (36.6 C)-98.6 F (37 C)] 98.6 F (37 C) (09/21 1228) Pulse Rate:  [64-73] 64 (09/21 1228) Resp:  [14-20] 18 (09/21 1228) BP: (105-154)/(47-70) 123/60 (09/21 1228) SpO2:  [99 %-100 %] 100 % (09/21 0739) Weight:  [88.8 kg] 88.8 kg (09/21 0500)  Weight change: -2.286 kg Filed Weights   06/30/21 0500 07/04/21 0500 07/05/21 0500  Weight: 91.4 kg 91.1 kg 88.8 kg    Intake/Output: I/O last 3 completed shifts: In: 279.4 [P.O.:180; IV Piggyback:99.4] Out: -    Intake/Output this shift:  No intake/output data recorded.  Physical Exam: General: In no acute distress  Head: Normocephalic, atraumatic  Eyes: Anicteric  Lungs:  Lungs clear,on 2L O2 via [place]  Heart: Regular rate and rhythm  Abdomen:  Soft, nontender,non distended  Extremities: Dressings intact on Rt.Leg  Neurologic: Oriented to self and place  Skin: No acute rashes or lesions  Access: Rt groin Permcath    Basic Metabolic Panel: Recent Labs  Lab 06/29/21 0536 06/30/21 0730 07/03/21 0527  NA 135 136  136 135  K 4.4 4.1  4.0 4.0  CL 95* 98  96* 99  CO2 '27 27  27 25  '$ GLUCOSE 159* 88  86 237*  BUN 53* 65*  67* 75*  CREATININE 5.91* 7.45*  7.43* 8.60*  CALCIUM 8.5* 8.6*  8.6* 8.7*  MG 2.0 2.3 2.2  PHOS 4.9* 5.9* 7.0*     Liver Function Tests: Recent Labs  Lab 06/29/21 0536 06/30/21 0730 07/03/21 0527  ALBUMIN 2.4* 2.4* 2.3*    No results for input(s): LIPASE, AMYLASE in the last 168 hours. No results for input(s): AMMONIA in the last 168 hours.  CBC: Recent Labs  Lab 06/29/21 0536 06/30/21 0730 07/03/21 0527  WBC 14.1* 14.5* 17.0*  HGB 8.5* 9.0* 8.9*  HCT 25.4* 26.9* 27.1*  MCV 87.0 87.6 86.9  PLT 251 268 307     Cardiac Enzymes: No results  for input(s): CKTOTAL, CKMB, CKMBINDEX, TROPONINI in the last 168 hours.  BNP: Invalid input(s): POCBNP  CBG: Recent Labs  Lab 07/04/21 1144 07/04/21 1654 07/04/21 2024 07/05/21 0738 07/05/21 1201  GLUCAP 139* 156* 181* 179* 202*     Microbiology: Results for orders placed or performed during the hospital encounter of 06/26/21  Blood culture (routine x 2)     Status: None   Collection Time: 06/27/21 12:42 AM   Specimen: BLOOD  Result Value Ref Range Status   Specimen Description BLOOD RIGHT ASSIST CONTROL  Final   Special Requests   Final    BOTTLES DRAWN AEROBIC AND ANAEROBIC Blood Culture adequate volume   Culture   Final    NO GROWTH 5 DAYS Performed at [facility], [address] [place], [place] [address]    Report Status 07/02/2021 FINAL  Final  Resp Panel by RT-PCR (Flu A&B, Covid) Nasopharyngeal Swab     Status: None   Collection Time: 06/27/21 12:42 AM   Specimen: Nasopharyngeal Swab; Nasopharyngeal(NP) swabs in vial transport medium  Result Value Ref Range Status   SARS Coronavirus 2 by RT PCR NEGATIVE NEGATIVE Final    Comment: (NOTE) SARS-CoV-2 target nucleic acids are NOT DETECTED.  The SARS-CoV-2 RNA is generally detectable in upper respiratory specimens during the acute phase  of infection. The lowest concentration of SARS-CoV-2 viral copies this assay can detect is 138 copies/mL. A negative result does not preclude SARS-Cov-2 infection and should not be used as the sole basis for treatment or other patient management decisions. A negative result may occur with  improper specimen collection/handling, submission of specimen other than nasopharyngeal swab, presence of viral mutation(s) within the areas targeted by this assay, and inadequate number of viral copies(<138 copies/mL). A negative result must be combined with clinical observations, patient history, and epidemiological information. The expected result is Negative.  Fact Sheet for  Patients:  EntrepreneurPulse.com.au  Fact Sheet for Healthcare Providers:  IncredibleEmployment.be  This test is no t yet approved or cleared by the [place] FDA and  has been authorized for detection and/or diagnosis of SARS-CoV-2 by FDA under an Emergency Use Authorization (EUA). This EUA will remain  in effect (meaning this test can be used) for the duration of the COVID-19 declaration under Section 564(b)(1) of the Act, 21 U.S.C.section 360bbb-3(b)(1), unless the authorization is terminated  or revoked sooner.       Influenza A by PCR NEGATIVE NEGATIVE Final   Influenza B by PCR NEGATIVE NEGATIVE Final    Comment: (NOTE) The Xpert Xpress SARS-CoV-2/FLU/RSV plus assay is intended as an aid in the diagnosis of influenza from Nasopharyngeal swab specimens and should not be used as a sole basis for treatment. Nasal washings and aspirates are unacceptable for Xpert Xpress SARS-CoV-2/FLU/RSV testing.  Fact Sheet for Patients: EntrepreneurPulse.com.au  Fact Sheet for Healthcare Providers: IncredibleEmployment.be  This test is not yet approved or cleared by the [place] FDA and has been authorized for detection and/or diagnosis of SARS-CoV-2 by FDA under an Emergency Use Authorization (EUA). This EUA will remain in effect (meaning this test can be used) for the duration of the COVID-19 declaration under Section 564(b)(1) of the Act, 21 U.S.C. section 360bbb-3(b)(1), unless the authorization is terminated or revoked.  Performed at [facility], [address] [place], [place] [address]   Blood culture (routine x 2)     Status: None   Collection Time: 06/27/21 12:43 AM   Specimen: BLOOD  Result Value Ref Range Status   Specimen Description BLOOD RIGHT FOREARM  Final   Special Requests   Final    BOTTLES DRAWN AEROBIC AND ANAEROBIC Blood Culture adequate volume   Culture   Final    NO  GROWTH 5 DAYS Performed at [facility], [address] [place], [place] [address]    Report Status 07/02/2021 FINAL  Final  MRSA Next Gen by PCR, Nasal     Status: None   Collection Time: 06/28/21  5:31 AM   Specimen: Nasal Mucosa; Nasal Swab  Result Value Ref Range Status   MRSA by PCR Next Gen NOT DETECTED NOT DETECTED Final    Comment: (NOTE) The GeneXpert MRSA Assay (FDA approved for NASAL specimens only), is one component of a comprehensive MRSA colonization surveillance program. It is not intended to diagnose MRSA infection nor to guide or monitor treatment for MRSA infections. Test performance is not FDA approved in patients less than 23 years old. Performed at [facility], [address] [place], [place] [address]     Coagulation Studies: No results for input(s): LABPROT, INR in the last 72 hours.   Urinalysis: No results for input(s): COLORURINE, LABSPEC, PHURINE, GLUCOSEU, HGBUR, BILIRUBINUR, KETONESUR, PROTEINUR, UROBILINOGEN, NITRITE, LEUKOCYTESUR in the last 72 hours.  Invalid input(s): APPERANCEUR    Imaging: No  results found.   Medications:    sodium chloride     ceFEPime (MAXIPIME) IV 1 g (07/05/21 0818)   vancomycin Stopped (07/03/21 1323)    acetaminophen  1,000 mg Oral TID   allopurinol  100 mg Oral Daily   amiodarone  200 mg Oral Daily   aspirin EC  81 mg Oral Daily   atorvastatin  40 mg Oral Daily   Chlorhexidine Gluconate Cloth  6 each Topical Q0600   clopidogrel  75 mg Oral Q breakfast   docusate sodium  100 mg Oral Daily   epoetin (EPOGEN/PROCRIT) injection  10,000 Units Intravenous Q M,W,F-HD   heparin injection (subcutaneous)  5,000 Units Subcutaneous Q8H   insulin aspart  0-5 Units Subcutaneous QHS   insulin aspart  0-6 Units Subcutaneous TID WC   insulin aspart  2 Units Subcutaneous TID WC   insulin detemir  10 Units Subcutaneous QHS   levETIRAcetam  500 mg Oral BID   levETIRAcetam  500 mg Oral Once per day on  Mon Wed Fri   mirtazapine  7.5 mg Oral QHS   senna-docusate  1 tablet Oral QHS   sertraline  25 mg Oral QHS   sevelamer carbonate  800 mg Oral TID WC   sodium chloride flush  3 mL Intravenous Q12H   sodium chloride, feeding supplement (NEPRO CARB STEADY), ondansetron **OR** ondansetron (ZOFRAN) IV, oxyCODONE, polyethylene glycol, sodium chloride flush, traZODone  Assessment/ Plan:  Ernest [name] is a 75 y.o.  male with systolic congestive heart failure, COPD, CAD, GERD, Hepatitis C, hypertension, dyslipidemia and end stage renal disease on dialysis, who was admitted to [facility] on 06/26/2021 for Acute respiratory failure with hypoxia ([place]) [J96.01] Sepsis ([place]) [A41.9] Fever, unspecified fever cause [R50.9] Hypervolemia, unspecified hypervolemia type [E87.70] Osteomyelitis of right foot, unspecified type ([place]) [M86.9] Sepsis, due to unspecified organism, unspecified whether acute organ dysfunction present ([place]) [A41.9]  [facility] [address]/MWF/Rt groin Permcath  End stage renal disease on dialysis:   Scheduled to receive dialysis today, UF goal 0.5L. Next treatment scheduled for Friday   Anemia of chronic kidney disease  Lab Results  Component Value Date   HGB 8.9 (L) 07/03/2021   Epogen 10000 units with HD   3. Secondary Hyperparathyroidism:  Lab Results  Component Value Date   PTH 63 10/05/2017   PTH Comment 10/05/2017   CALCIUM 8.7 (L) 07/03/2021   CAION 1.05 (L) 07/12/2020   PHOS 7.0 (H) 07/03/2021   Continue Sevelamer 800 mg TID  4. Hypertension Blood Pressure  123/60   5. Diabetes mellitus type II with chronic kidney disease: insulin dependent.  Lab Results  Component Value Date   HGBA1C 8.4 (H) 07/03/2021    Blood glucose readings within acceptable range with current Insulin regimen   LOS: 8   9/21/20222:35 PM

## 2021-07-06 DIAGNOSIS — R509 Fever, unspecified: Secondary | ICD-10-CM | POA: Diagnosis not present

## 2021-07-06 DIAGNOSIS — A419 Sepsis, unspecified organism: Secondary | ICD-10-CM | POA: Diagnosis not present

## 2021-07-06 DIAGNOSIS — M869 Osteomyelitis, unspecified: Secondary | ICD-10-CM | POA: Diagnosis not present

## 2021-07-06 DIAGNOSIS — Z7189 Other specified counseling: Secondary | ICD-10-CM | POA: Diagnosis not present

## 2021-07-06 DIAGNOSIS — Z515 Encounter for palliative care: Secondary | ICD-10-CM | POA: Diagnosis not present

## 2021-07-06 LAB — GLUCOSE, CAPILLARY
Glucose-Capillary: 129 mg/dL — ABNORMAL HIGH (ref 70–99)
Glucose-Capillary: 123 mg/dL — ABNORMAL HIGH (ref 70–99)
Glucose-Capillary: 109 mg/dL — ABNORMAL HIGH (ref 70–99)
Glucose-Capillary: 101 mg/dL — ABNORMAL HIGH (ref 70–99)

## 2021-07-06 NOTE — Progress Notes (Signed)
[facility] PROGRESS NOTE    Ernest [name]  [id number] DOB: [birth year] DOA: 06/26/2021 PCP: Patient, [name] (Inactive)      Brief Narrative:  Ernest [name] is a 75 y.o. M with dementia, lives in SNF, ESRD on HD MWF, sCHF, COPD on home O2, CAD, DVT, DM, HTN, and history stroke who presented with fever and worsening right foot pain.  In the ER, tachypneic, WBC 19K, lactic acid 3.1, and patient started on antibiotics and admitted.  Vascular surgery were subsequently consulted.  They performed angiography of the right leg and PCTA of the right common femoral artery on 9/14.  They felt that distal re vascularization of his gangrenous right heel wound was not possible, and recommended amputation.  The patient's POA is flying to [place] to conference with the team.          Assessment & Plan:  Sepsis due to infected right heel ulcer with calcaneal osteomyelitis and gangrene Patient presented with leukocytosis, tachypnea, as well as right calcaneal osteomyelitis.  Severe sepsis ruled out.    Vascular surgery were consulted, underwent angiography, revascularization of the right SFA, but no distal revascularization possible, likely no salvage of the right heel gangrene.  Vascular surgery recommended amputation, currently POA discussion still pending.  Sepsis physiology resolved - Continue vancomycin and cefepime, day 10 - Consult vascular surgery     Gout -Continue allopurinol  Paroxysmal atrial fibrillation Not on anticoagulation at baseline, rates controlled -Continue amiodarone      Peripheral vascular disease Coronary artery disease status post CABG -Continue aspirin, statin, Plavix   ESRD -Consult nephrology for maintenance dialysis  Chronic systolic and diastolic CHF Appears euvolemic   Type 2 diabetes, insulin-dependent with nephropathy A1c 8.4%, glucose controlled -Continue Levemir - Continue mealtime insulin - Continue sliding scale  correction     Vascular dementia Depression Patient appears at baseline - Continue Zoloft and Remeron   Seizures No seizures since admission -Continue Keppra  Right heel pressure injury, unstageable, present on admission   Anemia of CKD   Hypokalemia Pre-dialysis yesterday, corrected with dialysis          Disposition: Status is: Inpatient  Remains inpatient appropriate because:Unsafe d/c plan  Dispo: The patient is from: SNF              Anticipated d/c is to: SNF              Patient currently is not medically stable to d/c.   Difficult to place patient No       Level of care: Med-Surg       MDM: The below labs and imaging reports were reviewed and summarized above.  Medication management as above.    DVT prophylaxis: heparin injection 5,000 Units Start: 06/27/21 0600 SCDs Start: 06/27/21 0248  Code Status: FULL Family Communication: called to [place], no voicemail possible    Consultants:  Vascular surgery Palliative care  Procedures:  9/14 angiography  Antimicrobials:  Rocephin and Zosyn x1 9/12 Vancomycin 9/12>> Flagyl 9/13-9/19 Cefepime 9/13>>  Culture data:  9/12 blood cultures negative x2          Subjective: Patient denies headache, chest pain, dyspnea, abdominal pain.  His right leg pain is unchanged.  No change in mentation, appetite poor.  No fever.  Objective: Vitals:   07/05/21 2005 07/06/21 0505 07/06/21 0700 07/06/21 1059  BP: (!) 122/56 122/60  132/64  Pulse: 71 64  67  Resp: 18  13 20  Temp: 98.1 F (36.7 C) 98.1 F (36.7 C)  98.9 F (37.2 C)  TempSrc: Oral Oral  Oral  SpO2: 100% 99%  99%  Weight:  88.3 kg    Height:        Intake/Output Summary (Last 24 hours) at 07/06/2021 1127 Last data filed at 07/05/2021 1845 Gross per 24 hour  Intake 0 ml  Output 321 ml  Net -321 ml   Filed Weights   07/04/21 0500 07/05/21 0500 07/06/21 0505  Weight: 91.1 kg 88.8 kg 88.3 kg     Examination: General appearance: elderl;y adult male, awake but sleepy, no distress.   HEENT: Anicteric, conjunctiva pink, lids and lashes normal. No nasal deformity, discharge, epistaxis.  Lips moist, edentulous on top, no oral lesions, OP moist.   Skin: Warm and dry.   Right heel covered, not reinspected today. Cardiac: RRR, nl S1-S2, no murmurs appreciated.  No JVD, no LE edema Respiratory: Normal respiratory rate and rhythm.  CTAB without rales or wheezes. Abdomen: Abdomen soft.  No TTP no guarding. No ascites, distension, hepatosplenomegaly.   MSK: No deformities or effusions. Neuro: Awake but sleepy, does not engage.  Gives one word ansers, does not cooperate with exam  Psych: Responds to questions with yes, no or silence.       Data Reviewed: I have personally reviewed following labs and imaging studies:  CBC: Recent Labs  Lab 06/30/21 0730 07/03/21 0527 07/05/21 1442  WBC 14.5* 17.0* 22.2*  HGB 9.0* 8.9* 9.4*  HCT 26.9* 27.1* 28.6*  MCV 87.6 86.9 87.2  PLT 268 307 [id number]   Basic Metabolic Panel: Recent Labs  Lab 06/30/21 0730 07/03/21 0527 07/05/21 1442  NA 136  136 135 136  K 4.1  4.0 4.0 3.1*  CL 98  96* 99 96*  CO2 '27  27 25 27  '$ GLUCOSE 88  86 237* 171*  BUN 65*  67* 75* 25*  CREATININE 7.45*  7.43* 8.60* 3.31*  CALCIUM 8.6*  8.6* 8.7* 8.7*  MG 2.3 2.2  --   PHOS 5.9* 7.0* 3.2   GFR: Estimated Creatinine Clearance: 22.2 mL/min (A) (by C-G formula based on SCr of 3.31 mg/dL (H)). Liver Function Tests: Recent Labs  Lab 06/30/21 0730 07/03/21 0527  ALBUMIN 2.4* 2.3*   No results for input(s): LIPASE, AMYLASE in the last 168 hours. No results for input(s): AMMONIA in the last 168 hours. Coagulation Profile: Recent Labs  Lab 06/30/21 0730  INR 1.4*   Cardiac Enzymes: No results for input(s): CKTOTAL, CKMB, CKMBINDEX, TROPONINI in the last 168 hours. BNP (last 3 results) No results for input(s): PROBNP in the last 8760 hours. HbA1C: No  results for input(s): HGBA1C in the last 72 hours. CBG: Recent Labs  Lab 07/05/21 0738 07/05/21 1201 07/05/21 1616 07/05/21 2146 07/06/21 0829  GLUCAP 179* 202* 182* 197* 129*   Lipid Profile: No results for input(s): CHOL, HDL, LDLCALC, TRIG, CHOLHDL, LDLDIRECT in the last 72 hours. Thyroid Function Tests: No results for input(s): TSH, T4TOTAL, FREET4, T3FREE, THYROIDAB in the last 72 hours. Anemia Panel: No results for input(s): VITAMINB12, FOLATE, FERRITIN, TIBC, IRON, RETICCTPCT in the last 72 hours. Urine analysis:    Component Value Date/Time   COLORURINE YELLOW 04/29/2015 1702   APPEARANCEUR CLEAR 04/29/2015 1702   LABSPEC 1.016 04/29/2015 1702   PHURINE 7.5 04/29/2015 1702   GLUCOSEU >1000 (A) 04/29/2015 1702   HGBUR SMALL (A) 04/29/2015 1702   BILIRUBINUR NEGATIVE 04/29/2015 1702   KETONESUR NEGATIVE 04/29/2015 1702  PROTEINUR >300 (A) 04/29/2015 1702   UROBILINOGEN 0.2 04/29/2015 1702   NITRITE NEGATIVE 04/29/2015 1702   LEUKOCYTESUR NEGATIVE 04/29/2015 1702   Sepsis Labs: '@LABRCNTIP'$ (procalcitonin:4,lacticacidven:4)  ) Recent Results (from the past 240 hour(s))  Blood culture (routine x 2)     Status: None   Collection Time: 06/27/21 12:42 AM   Specimen: BLOOD  Result Value Ref Range Status   Specimen Description BLOOD RIGHT ASSIST CONTROL  Final   Special Requests   Final    BOTTLES DRAWN AEROBIC AND ANAEROBIC Blood Culture adequate volume   Culture   Final    NO GROWTH 5 DAYS Performed at [facility], [address] [place], [place] [address]    Report Status 07/02/2021 FINAL  Final  Resp Panel by RT-PCR (Flu A&B, Covid) Nasopharyngeal Swab     Status: None   Collection Time: 06/27/21 12:42 AM   Specimen: Nasopharyngeal Swab; Nasopharyngeal(NP) swabs in vial transport medium  Result Value Ref Range Status   SARS Coronavirus 2 by RT PCR NEGATIVE NEGATIVE Final    Comment: (NOTE) SARS-CoV-2 target nucleic acids are NOT DETECTED.  The  SARS-CoV-2 RNA is generally detectable in upper respiratory specimens during the acute phase of infection. The lowest concentration of SARS-CoV-2 viral copies this assay can detect is 138 copies/mL. A negative result does not preclude SARS-Cov-2 infection and should not be used as the sole basis for treatment or other patient management decisions. A negative result may occur with  improper specimen collection/handling, submission of specimen other than nasopharyngeal swab, presence of viral mutation(s) within the areas targeted by this assay, and inadequate number of viral copies(<138 copies/mL). A negative result must be combined with clinical observations, patient history, and epidemiological information. The expected result is Negative.  Fact Sheet for Patients:  EntrepreneurPulse.com.au  Fact Sheet for Healthcare Providers:  IncredibleEmployment.be  This test is no t yet approved or cleared by the [place] FDA and  has been authorized for detection and/or diagnosis of SARS-CoV-2 by FDA under an Emergency Use Authorization (EUA). This EUA will remain  in effect (meaning this test can be used) for the duration of the COVID-19 declaration under Section 564(b)(1) of the Act, 21 U.S.C.section 360bbb-3(b)(1), unless the authorization is terminated  or revoked sooner.       Influenza A by PCR NEGATIVE NEGATIVE Final   Influenza B by PCR NEGATIVE NEGATIVE Final    Comment: (NOTE) The Xpert Xpress SARS-CoV-2/FLU/RSV plus assay is intended as an aid in the diagnosis of influenza from Nasopharyngeal swab specimens and should not be used as a sole basis for treatment. Nasal washings and aspirates are unacceptable for Xpert Xpress SARS-CoV-2/FLU/RSV testing.  Fact Sheet for Patients: EntrepreneurPulse.com.au  Fact Sheet for Healthcare Providers: IncredibleEmployment.be  This test is not yet approved or  cleared by the [place] FDA and has been authorized for detection and/or diagnosis of SARS-CoV-2 by FDA under an Emergency Use Authorization (EUA). This EUA will remain in effect (meaning this test can be used) for the duration of the COVID-19 declaration under Section 564(b)(1) of the Act, 21 U.S.C. section 360bbb-3(b)(1), unless the authorization is terminated or revoked.  Performed at [facility], [address] [place], [place] [address]   Blood culture (routine x 2)     Status: None   Collection Time: 06/27/21 12:43 AM   Specimen: BLOOD  Result Value Ref Range Status   Specimen Description BLOOD RIGHT FOREARM  Final   Special Requests   Final  BOTTLES DRAWN AEROBIC AND ANAEROBIC Blood Culture adequate volume   Culture   Final    NO GROWTH 5 DAYS Performed at [facility], [address] [place], [place] [address]    Report Status 07/02/2021 FINAL  Final  MRSA Next Gen by PCR, Nasal     Status: None   Collection Time: 06/28/21  5:31 AM   Specimen: Nasal Mucosa; Nasal Swab  Result Value Ref Range Status   MRSA by PCR Next Gen NOT DETECTED NOT DETECTED Final    Comment: (NOTE) The GeneXpert MRSA Assay (FDA approved for NASAL specimens only), is one component of a comprehensive MRSA colonization surveillance program. It is not intended to diagnose MRSA infection nor to guide or monitor treatment for MRSA infections. Test performance is not FDA approved in patients less than 44 years old. Performed at [facility], [address], [place], [place] [address]          Radiology Studies: No results found.      Scheduled Meds:  acetaminophen  1,000 mg Oral TID   allopurinol  100 mg Oral Daily   amiodarone  200 mg Oral Daily   aspirin EC  81 mg Oral Daily   atorvastatin  40 mg Oral Daily   Chlorhexidine Gluconate Cloth  6 each Topical Q0600   clopidogrel  75 mg Oral Q breakfast   docusate sodium  100 mg Oral Daily   epoetin  (EPOGEN/PROCRIT) injection  10,000 Units Intravenous Q M,W,F-HD   heparin injection (subcutaneous)  5,000 Units Subcutaneous Q8H   insulin aspart  0-5 Units Subcutaneous QHS   insulin aspart  0-6 Units Subcutaneous TID WC   insulin aspart  2 Units Subcutaneous TID WC   insulin detemir  10 Units Subcutaneous QHS   levETIRAcetam  500 mg Oral BID   levETIRAcetam  500 mg Oral Once per day on Mon Wed Fri   mirtazapine  7.5 mg Oral QHS   senna-docusate  1 tablet Oral QHS   sertraline  25 mg Oral QHS   sevelamer carbonate  800 mg Oral TID WC   sodium chloride flush  3 mL Intravenous Q12H   Continuous Infusions:  sodium chloride     ceFEPime (MAXIPIME) IV 1 g (07/06/21 1104)   vancomycin 1,000 mg (07/05/21 1631)     LOS: 9 days    Time spent: 25 minutes    Edwin Dada, MD [facility] 07/06/2021, 11:27 AM     Please page though [place] or Epic secure chat:  For [employer], Adult nurse

## 2021-07-06 NOTE — Progress Notes (Addendum)
Daily Progress Note   Patient Name: Ernest [name]       Date: 07/06/2021 DOB: 1946/06/26  Age: 75 y.o. MRN#: [id number] Attending Physician: Ernest [name], * Primary Care Physician: Patient, [name] (Inactive) Admit Date: 06/26/2021 Length of Stay: 9 days  Reason for Consultation/Follow-up: Establishing goals of care  HPI/Patient Profile:  75 y.o. male  with past medical history of ESRD on HD, CHF, COPD, CAD, DVT, GERD, gout, hep C, htn, hld, and CVA admitted on 06/26/2021 with fever and chills. The patient has a right heel decubitus ulcer for which has been getting oral antibiotics however the wound has been getting progressively worse. Patient diagnosed with sepsis secondary to R heel ulcer. MRI right ankle without contrast showed large wound overlying the posterior aspect of the calcaneus with mild subcortical bone marrow edema in the posterior calcaneus most consistent with osteomyelitis, partial-thickness tear of the Achilles tendon along the medial calcaneal insertion and plantar fasciitis. Vascular has recommended BKA. Patient previously noted he wants to keep his foot, but there was question as to his capacity. Daughter Ernest [name]) is flying in from out of state, not making decisions until she gets here (estimated 9/22).  PMT was consulted for [name] discussion.  Subjective:   Subjective: Chart Reviewed. Updates received. Patient Assessed. Created space and opportunity for patient  and family to explore thoughts and feelings regarding current medical situation.  Today's Discussion: Met with the patient at the bedside. He knows the year and his name, confused to place and situation. Does know he has a wound, not sure of what they've recommended to help (appears to not remember/understand recommendation for BKA at this point). Denies pain, dyspnea, N/V. States he did hear his daughter is coming to visit. States he lives in [place], that his daughter is living with him (or will be  living with him?). No other complaints or concerns today.  He tells me it is ok to speak with his daughter about his health and that she can make his decisions for him if he cannot.  Review of Systems  Constitutional:  Negative for appetite change.       Denies pain in general  Respiratory:  Negative for cough and shortness of breath.   Cardiovascular:  Negative for chest pain.  Gastrointestinal:  Negative for abdominal pain, nausea and vomiting.   Objective:   Vital Signs:  BP 132/64 (BP Location: Left Arm)   Pulse 67   Temp 98.9 F (37.2 C) (Oral)   Resp 20   Ht 5' 9" (1.753 m)   Wt 88.3 kg   SpO2 99%   BMI 28.74 kg/m   Physical Exam: Physical Exam Vitals and nursing note reviewed.  Constitutional:      General: He is not in acute distress.    Appearance: He is overweight. He is ill-appearing.  HENT:     Head: Normocephalic and atraumatic.  Pulmonary:     Effort: Pulmonary effort is normal. No respiratory distress.  Abdominal:     General: Abdomen is flat.     Palpations: Abdomen is soft.     Tenderness: There is no abdominal tenderness.  Skin:    General: Skin is warm and dry.  Neurological:     Mental Status: He is alert. He is disoriented.  Psychiatric:        Mood and Affect: Mood normal.        Behavior: Behavior normal.    Palliative Assessment/Data: 40%   Assessment & Plan:  Impression: 75 year old male with complex acute and chronic issues. Is now admitted with sepsis due to foot wound which is not expected to heal/unable to be revascularized. It has been recommended he have a BKA. Previously expressed that he does not want this. However, there are questions about capacity to make decisions due to confusion and we have been working with his daughter/POA on decision-making. She has elected to not make a decision until she arrives from out of state 8/22. Plans to discuss with her at that time.  SUMMARY OF RECOMMENDATIONS   Continue attempts to contact  daughter to set-up meeting day/time Continue to treat the treatable Remain full code for now due to daughter's previous wishes PMT will continue to support  Code Status: Full code  Prognosis: Unable to determine  Discharge Planning: To Be Determined  Discussed with: Dr. Loleta [name], [facility] (RN)  Thank you for allowing [place] to participate in the care of Ernest [name] PMT will continue to support holistically.  ADDENDUM: I spoke with the patient's nurse who was able to speak with the patient's daughter. States she has been stuck in the airport because her flight was cancelled. Daughter was asking if the amputation is urgent. Had a discussion with hospitalist, RN, vascular. Not sure when the daughter will be able to get here. She did speak to her father on the phone but it seems like she is not aware of how bad his dementia is currently. Vasc Surg has said she previously did not want hospice care for her father. However, he has been clear with her that he does not want them to "take my foot." It seems a family meeting, in person, with the daughter is the best way to sort this out. Will continue to try and facilitate this.  Time Total: 70 min  Visit consisted of counseling and education dealing with the complex and emotionally intense issues of symptom management and palliative care in the setting of serious and potentially life-threatening illness. Greater than 50%  of this time was spent counseling and coordinating care related to the above assessment and plan.  Ernest Field, NP Palliative Medicine Team  Team Phone # [phone] (Nights/Weekends)  06/13/2021, 8:17 AM

## 2021-07-06 NOTE — Progress Notes (Signed)
[facility]  ROUNDING NOTE   Subjective:   Patient seen sitting up in bed Alert, oriented, delayed response  Breakfast at bedside, awaiting assistance with feeding  Dialysis yesterday, tolerated well  Objective:  Vital signs in last 24 hours:  Temp:  [98.1 F (36.7 C)-98.9 F (37.2 C)] 98.9 F (37.2 C) (09/22 1059) Pulse Rate:  [64-71] 67 (09/22 1059) Resp:  [13-20] 20 (09/22 1059) BP: (83-132)/(41-64) 132/64 (09/22 1059) SpO2:  [99 %-100 %] 99 % (09/22 1059) Weight:  [88.3 kg] 88.3 kg (09/22 0505)  Weight change: -0.544 kg Filed Weights   07/04/21 0500 07/05/21 0500 07/06/21 0505  Weight: 91.1 kg 88.8 kg 88.3 kg    Intake/Output: I/O last 3 completed shifts: In: 0  Out: 321 [Other:321]   Intake/Output this shift:  No intake/output data recorded.  Physical Exam: General: In no acute distress  Head: Normocephalic, atraumatic  Eyes: Anicteric  Lungs:  Lungs clear,on 3L O2 via [place]  Heart: Regular rate and rhythm  Abdomen:  Soft, nontender,non distended  Extremities: Dressings intact on Rt.Leg  Neurologic: Oriented to self and place  Skin: No acute rashes or lesions  Access: Rt groin Permcath    Basic Metabolic Panel: Recent Labs  Lab 06/30/21 0730 07/03/21 0527 07/05/21 1442  NA 136  136 135 136  K 4.1  4.0 4.0 3.1*  CL 98  96* 99 96*  CO2 '27  27 25 27  '$ GLUCOSE 88  86 237* 171*  BUN 65*  67* 75* 25*  CREATININE 7.45*  7.43* 8.60* 3.31*  CALCIUM 8.6*  8.6* 8.7* 8.7*  MG 2.3 2.2  --   PHOS 5.9* 7.0* 3.2     Liver Function Tests: Recent Labs  Lab 06/30/21 0730 07/03/21 0527  ALBUMIN 2.4* 2.3*    No results for input(s): LIPASE, AMYLASE in the last 168 hours. No results for input(s): AMMONIA in the last 168 hours.  CBC: Recent Labs  Lab 06/30/21 0730 07/03/21 0527 07/05/21 1442  WBC 14.5* 17.0* 22.2*  HGB 9.0* 8.9* 9.4*  HCT 26.9* 27.1* 28.6*  MCV 87.6 86.9 87.2  PLT 268 307 340     Cardiac Enzymes: No  results for input(s): CKTOTAL, CKMB, CKMBINDEX, TROPONINI in the last 168 hours.  BNP: Invalid input(s): POCBNP  CBG: Recent Labs  Lab 07/05/21 1201 07/05/21 1616 07/05/21 2146 07/06/21 0829 07/06/21 1143  GLUCAP 202* 182* 197* 129* 123*     Microbiology: Results for orders placed or performed during the hospital encounter of 06/26/21  Blood culture (routine x 2)     Status: None   Collection Time: 06/27/21 12:42 AM   Specimen: BLOOD  Result Value Ref Range Status   Specimen Description BLOOD RIGHT ASSIST CONTROL  Final   Special Requests   Final    BOTTLES DRAWN AEROBIC AND ANAEROBIC Blood Culture adequate volume   Culture   Final    NO GROWTH 5 DAYS Performed at [facility], [address] [place], [place] [address]    Report Status 07/02/2021 FINAL  Final  Resp Panel by RT-PCR (Flu A&B, Covid) Nasopharyngeal Swab     Status: None   Collection Time: 06/27/21 12:42 AM   Specimen: Nasopharyngeal Swab; Nasopharyngeal(NP) swabs in vial transport medium  Result Value Ref Range Status   SARS Coronavirus 2 by RT PCR NEGATIVE NEGATIVE Final    Comment: (NOTE) SARS-CoV-2 target nucleic acids are NOT DETECTED.  The SARS-CoV-2 RNA is generally detectable in upper respiratory specimens during the  acute phase of infection. The lowest concentration of SARS-CoV-2 viral copies this assay can detect is 138 copies/mL. A negative result does not preclude SARS-Cov-2 infection and should not be used as the sole basis for treatment or other patient management decisions. A negative result may occur with  improper specimen collection/handling, submission of specimen other than nasopharyngeal swab, presence of viral mutation(s) within the areas targeted by this assay, and inadequate number of viral copies(<138 copies/mL). A negative result must be combined with clinical observations, patient history, and epidemiological information. The expected result is Negative.  Fact  Sheet for Patients:  EntrepreneurPulse.com.au  Fact Sheet for Healthcare Providers:  IncredibleEmployment.be  This test is no t yet approved or cleared by the [place] FDA and  has been authorized for detection and/or diagnosis of SARS-CoV-2 by FDA under an Emergency Use Authorization (EUA). This EUA will remain  in effect (meaning this test can be used) for the duration of the COVID-19 declaration under Section 564(b)(1) of the Act, 21 U.S.C.section 360bbb-3(b)(1), unless the authorization is terminated  or revoked sooner.       Influenza A by PCR NEGATIVE NEGATIVE Final   Influenza B by PCR NEGATIVE NEGATIVE Final    Comment: (NOTE) The Xpert Xpress SARS-CoV-2/FLU/RSV plus assay is intended as an aid in the diagnosis of influenza from Nasopharyngeal swab specimens and should not be used as a sole basis for treatment. Nasal washings and aspirates are unacceptable for Xpert Xpress SARS-CoV-2/FLU/RSV testing.  Fact Sheet for Patients: EntrepreneurPulse.com.au  Fact Sheet for Healthcare Providers: IncredibleEmployment.be  This test is not yet approved or cleared by the [place] FDA and has been authorized for detection and/or diagnosis of SARS-CoV-2 by FDA under an Emergency Use Authorization (EUA). This EUA will remain in effect (meaning this test can be used) for the duration of the COVID-19 declaration under Section 564(b)(1) of the Act, 21 U.S.C. section 360bbb-3(b)(1), unless the authorization is terminated or revoked.  Performed at [facility], [address] [place], [place] [address]   Blood culture (routine x 2)     Status: None   Collection Time: 06/27/21 12:43 AM   Specimen: BLOOD  Result Value Ref Range Status   Specimen Description BLOOD RIGHT FOREARM  Final   Special Requests   Final    BOTTLES DRAWN AEROBIC AND ANAEROBIC Blood Culture adequate volume   Culture    Final    NO GROWTH 5 DAYS Performed at [facility], [address], [place], [place] [address]    Report Status 07/02/2021 FINAL  Final  MRSA Next Gen by PCR, Nasal     Status: None   Collection Time: 06/28/21  5:31 AM   Specimen: Nasal Mucosa; Nasal Swab  Result Value Ref Range Status   MRSA by PCR Next Gen NOT DETECTED NOT DETECTED Final    Comment: (NOTE) The GeneXpert MRSA Assay (FDA approved for NASAL specimens only), is one component of a comprehensive MRSA colonization surveillance program. It is not intended to diagnose MRSA infection nor to guide or monitor treatment for MRSA infections. Test performance is not FDA approved in patients less than 24 years old. Performed at [facility], [address] [place], [place] [address]     Coagulation Studies: No results for input(s): LABPROT, INR in the last 72 hours.   Urinalysis: No results for input(s): COLORURINE, LABSPEC, PHURINE, GLUCOSEU, HGBUR, BILIRUBINUR, KETONESUR, PROTEINUR, UROBILINOGEN, NITRITE, LEUKOCYTESUR in the last 72 hours.  Invalid input(s): APPERANCEUR  Imaging: No results found.   Medications:    sodium chloride     ceFEPime (MAXIPIME) IV 1 g (07/06/21 1104)   vancomycin 1,000 mg (07/05/21 1631)    acetaminophen  1,000 mg Oral TID   allopurinol  100 mg Oral Daily   amiodarone  200 mg Oral Daily   aspirin EC  81 mg Oral Daily   atorvastatin  40 mg Oral Daily   Chlorhexidine Gluconate Cloth  6 each Topical Q0600   clopidogrel  75 mg Oral Q breakfast   docusate sodium  100 mg Oral Daily   epoetin (EPOGEN/PROCRIT) injection  10,000 Units Intravenous Q M,W,F-HD   heparin injection (subcutaneous)  5,000 Units Subcutaneous Q8H   insulin aspart  0-5 Units Subcutaneous QHS   insulin aspart  0-6 Units Subcutaneous TID WC   insulin aspart  2 Units Subcutaneous TID WC   insulin detemir  10 Units Subcutaneous QHS   levETIRAcetam  500 mg Oral BID   levETIRAcetam  500 mg Oral  Once per day on Mon Wed Fri   mirtazapine  7.5 mg Oral QHS   senna-docusate  1 tablet Oral QHS   sertraline  25 mg Oral QHS   sevelamer carbonate  800 mg Oral TID WC   sodium chloride flush  3 mL Intravenous Q12H   sodium chloride, feeding supplement (NEPRO CARB STEADY), ondansetron **OR** ondansetron (ZOFRAN) IV, oxyCODONE, polyethylene glycol, sodium chloride flush, traZODone  Assessment/ Plan:  [name] is a 75 y.o.  male with systolic congestive heart failure, COPD, CAD, GERD, Hepatitis C, hypertension, dyslipidemia and end stage renal disease on dialysis, who was admitted to [facility] on 06/26/2021 for Acute respiratory failure with hypoxia ([place]) [J96.01] Sepsis ([place]) [A41.9] Fever, unspecified fever cause [R50.9] Hypervolemia, unspecified hypervolemia type [E87.70] Osteomyelitis of right foot, unspecified type ([place]) [M86.9] Sepsis, due to unspecified organism, unspecified whether acute organ dysfunction present ([place]) [A41.9]  [facility] [address]/MWF/Rt groin Permcath  End stage renal disease on dialysis:   Received dialysis yesterday, UF 384m achieved. Patient had brief episode of hypotension during last 30 min of treatment. Turned UF off for remainder of treatment. Next treatment scheduled for Friday in chair   Anemia of chronic kidney disease  Lab Results  Component Value Date   HGB 9.4 (L) 07/05/2021  Within acceptable range Reduce EPO to Epogen 4000 units with HD   3. Secondary Hyperparathyroidism:  Lab Results  Component Value Date   PTH 63 10/05/2017   PTH Comment 10/05/2017   CALCIUM 8.7 (L) 07/05/2021   CAION 1.05 (L) 07/12/2020   PHOS 3.2 07/05/2021  Phosphorus at goal Continue Sevelamer 800 mg TID  4. Hypertension Blood Pressure  132/64   5. Diabetes mellitus type II with chronic kidney disease: insulin dependent.  Lab Results  Component Value Date   HGBA1C 8.4 (H) 07/03/2021    Blood glucose readings within acceptable range with current  Insulin regimen   LOS: 9   9/22/20221:58 PM

## 2021-07-06 NOTE — Care Plan (Signed)
Spoke to daughter, [name], uncle Annalee Genta and son Demetrick this afternoon.  Discussed clinical course, natural history of ischemic ulcers with suspected osteomyelitis, discussed his failure to improve with antibiotic therapy and the lack of revascularization targets.  They undertstand the medical and surgical teams' recommendations for amputation, and will discuss with Vascular surgery to consent for same.

## 2021-07-06 NOTE — Progress Notes (Signed)
Physical Therapy Treatment Patient Details Name: Ernest [name] MRN: [id number] DOB: April 14, 1946 Today's Date: 07/06/2021   History of Present Illness 75 y.o. male admitted on 06/26/2021 with sepsis.  Pharmacy has been consulted for vancomycin and cefepime dosing. Patient with history of ESRD on HD (MWF). Patient has had a pressure ulcer on the right heel for which she has been receiving oral antibiotics.  The wound has been getting progressively worse and now he has a fever    PT Comments    Lethargic.  He does participate in ex as below with cues to remain awake and engaged.  Agrees to sitting EOB.  He is able to transition with mod a x 1 and sit x 3 mintues before asking to lay back down.  He is encouraged to increase time but initiates supine after 4 minutes.  He does do much better sitting today vs Tuesday but continues with post lean and cues to sit upright.    Pt is Hoyer type lift appropriate for any OOB transfers.  Attempted lateral scoot in sitting but he is unable to coordinate movements or assist in any way.     Recommendations for follow up therapy are one component of a multi-disciplinary discharge planning process, led by the attending physician.  Recommendations may be updated based on patient status, additional functional criteria and insurance authorization.  Follow Up Recommendations  SNF     Equipment Recommendations       Recommendations for Other Services       Precautions / Restrictions Precautions Precautions: Fall Restrictions Weight Bearing Restrictions: Yes RLE Weight Bearing: Non weight bearing Other Position/Activity Restrictions: NWB per secure chart with podiatry     Mobility  Bed Mobility Overal bed mobility: Needs Assistance Bed Mobility: Supine to Sit;Sit to Supine     Supine to sit: Mod assist Sit to supine: Mod assist        Transfers Overall transfer level: Needs assistance   Transfers: Lateral/Scoot Transfers            General transfer comment: unable to coordinate movements,  dependant for lateral scootring  Ambulation/Gait             General Gait Details: unable to attempt ambulation at this time. Harrel Lemon type lift appropriate for [employer] (Stroke Patients Only)       Balance Overall balance assessment: Needs assistance Sitting-balance support: Feet supported;Single extremity supported Sitting balance-Leahy Scale: Poor Sitting balance - Comments: able to sit today with supervision but with post lean and generally unstable but no assist. Postural control: Posterior lean                                  Cognition Arousal/Alertness: Lethargic Behavior During Therapy: Flat affect Overall Cognitive Status: No family/caregiver present to determine baseline cognitive functioning                                 General Comments: difficult to understand speech      Exercises Other Exercises Other Exercises: supine and seated AAROM x 10    General Comments        Pertinent Vitals/Pain Pain Assessment: No/denies pain    Home Living  Prior Function            PT Goals (current goals can now be found in the care plan section) Progress towards PT goals: Progressing toward goals    Frequency    Min 2X/week      PT Plan Current plan remains appropriate    Co-evaluation              AM-PAC PT "6 Clicks" Mobility   Outcome Measure  Help needed turning from your back to your side while in a flat bed without using bedrails?: A Lot Help needed moving from lying on your back to sitting on the side of a flat bed without using bedrails?: A Lot Help needed moving to and from a bed to a chair (including a wheelchair)?: Total Help needed standing up from a chair using your arms (e.g., wheelchair or bedside chair)?: Total Help needed to walk in hospital  room?: Total Help needed climbing 3-5 steps with a railing? : Total 6 Click Score: 8    End of Session Equipment Utilized During Treatment: Oxygen Activity Tolerance: Patient limited by fatigue Patient left: in bed;with bed alarm set;with SCD's reapplied;with call bell/phone within reach Nurse Communication: Mobility status PT Visit Diagnosis: Muscle weakness (generalized) (M62.81);Difficulty in walking, not elsewhere classified (R26.2);Other abnormalities of gait and mobility (R26.89);Unsteadiness on feet (R26.81)     Time: [id number] PT Time Calculation (min) (ACUTE ONLY): 11 min  Charges:  $Therapeutic Exercise: 8-22 mins                    Chesley Noon, PTA 07/06/21, 10:12 AM

## 2021-07-07 ENCOUNTER — Other Ambulatory Visit (INDEPENDENT_AMBULATORY_CARE_PROVIDER_SITE_OTHER): Payer: Self-pay | Admitting: Vascular Surgery

## 2021-07-07 DIAGNOSIS — A419 Sepsis, unspecified organism: Secondary | ICD-10-CM | POA: Diagnosis not present

## 2021-07-07 LAB — GLUCOSE, CAPILLARY
Glucose-Capillary: 104 mg/dL — ABNORMAL HIGH (ref 70–99)
Glucose-Capillary: 153 mg/dL — ABNORMAL HIGH (ref 70–99)
Glucose-Capillary: 115 mg/dL — ABNORMAL HIGH (ref 70–99)

## 2021-07-07 LAB — VANCOMYCIN, RANDOM: Vancomycin Rm: 16

## 2021-07-07 MED ORDER — MIDODRINE HCL 5 MG PO TABS
ORAL_TABLET | ORAL | Status: AC
  Filled 2021-07-07: qty 1

## 2021-07-07 MED ORDER — ALBUMIN HUMAN 25 % IV SOLN
INTRAVENOUS | Status: AC
  Administered 2021-07-07: 25 g via INTRAVENOUS
  Filled 2021-07-07: qty 100

## 2021-07-07 MED ORDER — ALBUMIN HUMAN 25 % IV SOLN: 25.0000 g | Freq: Once | INTRAVENOUS | Status: AC

## 2021-07-07 MED ORDER — MIDODRINE HCL 5 MG PO TABS
10.0000 mg | ORAL_TABLET | Freq: Once | ORAL | Status: AC
  Administered 2021-07-07: 10 mg via ORAL

## 2021-07-07 MED ORDER — HEPARIN SODIUM (PORCINE) 1000 UNIT/ML IJ SOLN
INTRAMUSCULAR | Status: AC
  Filled 2021-07-07: qty 1

## 2021-07-07 NOTE — Progress Notes (Signed)
[facility]  Communication Note: The patient is a 75 year old male who presented to the [facility] emergency department from his nursing home with extensive wounds to the right lower extremity.    The patient underwent an angiogram on June 28, 2021 with Dr. Delana Meyer              1.  Introduction catheter into right lower extremity 3rd order catheter placement              2.    Contrast injection right lower extremity for distal runoff             3.  Percutaneous transluminal angioplasty right common femoral artery.              4.  Star close closure left common femoral arteriotomy  Angiogram findings: Trifurcation demonstrates moderate to severe calcific disease the anterior tibial is widely patent throughout its course and is the dominant runoff to the foot filling the dorsalis pedis and the pedal arch.  The tibioperoneal trunk and peroneal are also patent with the peroneal extending down to the foot but it collateralizes poorly across the ankle.  The posterior tibial demonstrates a focal 90% stenosis at its origin.  Then in its midportion it becomes increasingly diseased and is occluded in its distal one third with poor filling of the plantar vessels.  The patient was also evaluated by podiatry.  Due to the severity of his wounds and his small vessel disease which we were unable to intervene on amputation was recommended.  The patient has a history of dementia and is unable to consent therefore as his daughter is power of attorney.  I had spoken to the daughter numerous times since we were initially consulted - most recently yesterday.  We had a long discussion about all the information listed above.  We are recommending an above-the-knee amputation as the patient is now bedbound and will contracture with a below the knee.    The patient's daughter is on her way from [facility] however has run into airline cancellation issues.  Hopefully she  will arrive before surgery.  I do not feel that the patient's daughter understands the patient's advanced dementia, severity of his wounds and overall health.  Hospice/palliative care was also refused by the daughter.  The patient's daughter did consent yesterday to move forward with a right above-the-knee amputation.  We will plan on this Saturday.  I will go ahead and preop the patient today.  Discussed in detail with Dr. Delana Meyer / Eliot Ford [name] 07/07/2021 12:32 PM

## 2021-07-07 NOTE — Progress Notes (Signed)
Pt. with rt fem catheter tolerated tx , B/P remained low with B/P support. Pt received Albumin support, and Bolus NS at initiation of tx. Pt. at baseline,at this time,will return to unit.

## 2021-07-07 NOTE — Consult Note (Signed)
Pharmacy Antibiotic Note  Ernest [name] is a 75 y.o. male admitted on 06/26/2021 with cellulitis. Pharmacy has been consulted for vancomycin and cefepime dosing. Patient with history of ESRD on HD (MWF). Patient has had a pressure ulcer on the right heel for which she had been receiving oral antibiotics.  He is now s/p angiogram June 28, 2021 and plans to proceed with a BKA on 07/08/21. Today is day # 10 of broad-spectrum antibioitics  07/07/21 vancomycin level: 16 mcg/mL    Plan:  1) continue cefepime 1 gram IV every 24 hours  2) vancomycin level is therapeutic: continue vancomycin 1000 mg qHD session  Goal pre-HD random: 15-25 mcg/mL   Height: '5\' 9"'$  (175.3 cm) Weight: 88.2 kg (194 lb 7.1 oz) IBW/kg (Calculated) : 70.7  Temp (24hrs), Avg:98.2 F (36.8 C), Min:97.7 F (36.5 C), Max:98.9 F (37.2 C)  Recent Labs  Lab 07/01/21 0535 07/03/21 0527 07/05/21 1442  WBC  --  17.0* 22.2*  CREATININE  --  8.60* 3.31*  VANCORANDOM 18  --   --      Estimated Creatinine Clearance: 22.2 mL/min (A) (by C-G formula based on SCr of 3.31 mg/dL (H)).    [name]  Antimicrobials this admission: 9/13 metronidazole>> 09/19 9/13 vancomycin >> 9/13 cefepime>>  Microbiology results: 9/13 BCx: NG final 9/14 MRSA PCR: negative 9/13 SARS CoV-2: negative 9/13 influenza A/B: negative  Thank you for allowing pharmacy to be a part of this patient's care.  Dallie Piles, PharmD Clinical Pharmacist   07/07/2021 8:23 AM

## 2021-07-07 NOTE — Progress Notes (Signed)
[facility]  ROUNDING NOTE   Subjective:   Patient seen and evaluated during dialysis   HEMODIALYSIS FLOWSHEET:  Blood Flow Rate (mL/min): 400 mL/min Arterial Pressure (mmHg): -190 mmHg Venous Pressure (mmHg): 140 mmHg Transmembrane Pressure (mmHg): 70 mmHg Ultrafiltration Rate (mL/min): 170 mL/min Dialysate Flow Rate (mL/min): 500 ml/min Conductivity: Machine : 13.8 Conductivity: Machine : 13.8 Dialysis Fluid Bolus: Normal Saline Bolus Amount (mL): 300 mL  Hypotensive prior to initiation of dialysis Alert and delayed response to orientation questions   Objective:  Vital signs in last 24 hours:  Temp:  [97.7 F (36.5 C)-98.2 F (36.8 C)] 97.8 F (36.6 C) (09/23 0827) Pulse Rate:  [57-65] 64 (09/23 1115) Resp:  [14-30] 24 (09/23 1115) BP: (78-139)/(31-72) 94/50 (09/23 1115) SpO2:  [96 %-100 %] 100 % (09/23 1115) Weight:  [88.2 kg] 88.2 kg (09/23 0500)  Weight change: -0.07 kg Filed Weights   07/05/21 0500 07/06/21 0505 07/07/21 0500  Weight: 88.8 kg 88.3 kg 88.2 kg    Intake/Output: No intake/output data recorded.   Intake/Output this shift:  No intake/output data recorded.  Physical Exam: General: In no acute distress  Head: Normocephalic, atraumatic  Eyes: Anicteric  Lungs:  Lungs clear,on 3L O2 via [place]  Heart: Regular rate and rhythm  Abdomen:  Soft, nontender,non distended  Extremities: Dressings intact on Rt.Leg  Neurologic: Oriented to self and place  Skin: Right heel ulcer  Access: Rt groin Permcath    Basic Metabolic Panel: Recent Labs  Lab 07/03/21 0527 07/05/21 1442  NA 135 136  K 4.0 3.1*  CL 99 96*  CO2 25 27  GLUCOSE 237* 171*  BUN 75* 25*  CREATININE 8.60* 3.31*  CALCIUM 8.7* 8.7*  MG 2.2  --   PHOS 7.0* 3.2     Liver Function Tests: Recent Labs  Lab 07/03/21 0527  ALBUMIN 2.3*    No results for input(s): LIPASE, AMYLASE in the last 168 hours. No results for input(s): AMMONIA in the last 168  hours.  CBC: Recent Labs  Lab 07/03/21 0527 07/05/21 1442  WBC 17.0* 22.2*  HGB 8.9* 9.4*  HCT 27.1* 28.6*  MCV 86.9 87.2  PLT 307 340     Cardiac Enzymes: No results for input(s): CKTOTAL, CKMB, CKMBINDEX, TROPONINI in the last 168 hours.  BNP: Invalid input(s): POCBNP  CBG: Recent Labs  Lab 07/05/21 2146 07/06/21 0829 07/06/21 1143 07/06/21 1701 07/06/21 2050  GLUCAP 197* 129* 123* 109* 101*     Microbiology: Results for orders placed or performed during the hospital encounter of 06/26/21  Blood culture (routine x 2)     Status: None   Collection Time: 06/27/21 12:42 AM   Specimen: BLOOD  Result Value Ref Range Status   Specimen Description BLOOD RIGHT ASSIST CONTROL  Final   Special Requests   Final    BOTTLES DRAWN AEROBIC AND ANAEROBIC Blood Culture adequate volume   Culture   Final    NO GROWTH 5 DAYS Performed at [facility], [address] [place], [place] [address]    Report Status 07/02/2021 FINAL  Final  Resp Panel by RT-PCR (Flu A&B, Covid) Nasopharyngeal Swab     Status: None   Collection Time: 06/27/21 12:42 AM   Specimen: Nasopharyngeal Swab; Nasopharyngeal(NP) swabs in vial transport medium  Result Value Ref Range Status   SARS Coronavirus 2 by RT PCR NEGATIVE NEGATIVE Final    Comment: (NOTE) SARS-CoV-2 target nucleic acids are NOT DETECTED.  The SARS-CoV-2 RNA is generally detectable  in upper respiratory specimens during the acute phase of infection. The lowest concentration of SARS-CoV-2 viral copies this assay can detect is 138 copies/mL. A negative result does not preclude SARS-Cov-2 infection and should not be used as the sole basis for treatment or other patient management decisions. A negative result may occur with  improper specimen collection/handling, submission of specimen other than nasopharyngeal swab, presence of viral mutation(s) within the areas targeted by this assay, and inadequate number of  viral copies(<138 copies/mL). A negative result must be combined with clinical observations, patient history, and epidemiological information. The expected result is Negative.  Fact Sheet for Patients:  EntrepreneurPulse.com.au  Fact Sheet for Healthcare Providers:  IncredibleEmployment.be  This test is no t yet approved or cleared by the [place] FDA and  has been authorized for detection and/or diagnosis of SARS-CoV-2 by FDA under an Emergency Use Authorization (EUA). This EUA will remain  in effect (meaning this test can be used) for the duration of the COVID-19 declaration under Section 564(b)(1) of the Act, 21 U.S.C.section 360bbb-3(b)(1), unless the authorization is terminated  or revoked sooner.       Influenza A by PCR NEGATIVE NEGATIVE Final   Influenza B by PCR NEGATIVE NEGATIVE Final    Comment: (NOTE) The Xpert Xpress SARS-CoV-2/FLU/RSV plus assay is intended as an aid in the diagnosis of influenza from Nasopharyngeal swab specimens and should not be used as a sole basis for treatment. Nasal washings and aspirates are unacceptable for Xpert Xpress SARS-CoV-2/FLU/RSV testing.  Fact Sheet for Patients: EntrepreneurPulse.com.au  Fact Sheet for Healthcare Providers: IncredibleEmployment.be  This test is not yet approved or cleared by the [place] FDA and has been authorized for detection and/or diagnosis of SARS-CoV-2 by FDA under an Emergency Use Authorization (EUA). This EUA will remain in effect (meaning this test can be used) for the duration of the COVID-19 declaration under Section 564(b)(1) of the Act, 21 U.S.C. section 360bbb-3(b)(1), unless the authorization is terminated or revoked.  Performed at [facility], [address] [place], [place] [address]   Blood culture (routine x 2)     Status: None   Collection Time: 06/27/21 12:43 AM   Specimen: BLOOD  Result  Value Ref Range Status   Specimen Description BLOOD RIGHT FOREARM  Final   Special Requests   Final    BOTTLES DRAWN AEROBIC AND ANAEROBIC Blood Culture adequate volume   Culture   Final    NO GROWTH 5 DAYS Performed at [facility], [address], [place], [place] [address]    Report Status 07/02/2021 FINAL  Final  MRSA Next Gen by PCR, Nasal     Status: None   Collection Time: 06/28/21  5:31 AM   Specimen: Nasal Mucosa; Nasal Swab  Result Value Ref Range Status   MRSA by PCR Next Gen NOT DETECTED NOT DETECTED Final    Comment: (NOTE) The GeneXpert MRSA Assay (FDA approved for NASAL specimens only), is one component of a comprehensive MRSA colonization surveillance program. It is not intended to diagnose MRSA infection nor to guide or monitor treatment for MRSA infections. Test performance is not FDA approved in patients less than 51 years old. Performed at [facility], [address] [place], [place] [address]     Coagulation Studies: No results for input(s): LABPROT, INR in the last 72 hours.   Urinalysis: No results for input(s): COLORURINE, LABSPEC, PHURINE, GLUCOSEU, HGBUR, BILIRUBINUR, KETONESUR, PROTEINUR, UROBILINOGEN, NITRITE, LEUKOCYTESUR in the last 72 hours.  Invalid input(s): APPERANCEUR    Imaging: No results found.   Medications:    sodium chloride     ceFEPime (MAXIPIME) IV 1 g (07/06/21 1104)   vancomycin 1,000 mg (07/05/21 1631)    acetaminophen  1,000 mg Oral TID   allopurinol  100 mg Oral Daily   amiodarone  200 mg Oral Daily   aspirin EC  81 mg Oral Daily   atorvastatin  40 mg Oral Daily   Chlorhexidine Gluconate Cloth  6 each Topical Q0600   clopidogrel  75 mg Oral Q breakfast   docusate sodium  100 mg Oral Daily   epoetin (EPOGEN/PROCRIT) injection  10,000 Units Intravenous Q M,W,F-HD   heparin injection (subcutaneous)  5,000 Units Subcutaneous Q8H   heparin sodium (porcine)       insulin aspart  0-5 Units  Subcutaneous QHS   insulin aspart  0-6 Units Subcutaneous TID WC   insulin aspart  2 Units Subcutaneous TID WC   insulin detemir  10 Units Subcutaneous QHS   levETIRAcetam  500 mg Oral BID   levETIRAcetam  500 mg Oral Once per day on Mon Wed Fri   midodrine       midodrine       mirtazapine  7.5 mg Oral QHS   senna-docusate  1 tablet Oral QHS   sertraline  25 mg Oral QHS   sevelamer carbonate  800 mg Oral TID WC   sodium chloride flush  3 mL Intravenous Q12H   sodium chloride, feeding supplement (NEPRO CARB STEADY), ondansetron **OR** ondansetron (ZOFRAN) IV, oxyCODONE, polyethylene glycol, sodium chloride flush, traZODone  Assessment/ Plan:  Ernest [name] is a 75 y.o.  male with systolic congestive heart failure, COPD, CAD, GERD, Hepatitis C, hypertension, dyslipidemia and end stage renal disease on dialysis, who was admitted to [facility] on 06/26/2021 for Acute respiratory failure with hypoxia ([place]) [J96.01] Sepsis ([place]) [A41.9] Fever, unspecified fever cause [R50.9] Hypervolemia, unspecified hypervolemia type [E87.70] Osteomyelitis of right foot, unspecified type ([place]) [M86.9] Sepsis, due to unspecified organism, unspecified whether acute organ dysfunction present ([place]) [A41.9]  [facility] [address]/MWF/Rt groin Permcath  End stage renal disease on dialysis:   Hypotensive prior to dialysis. Midodrine '10mg'$  given with little desired effect. Given 366m NS bolus with initiation of dialysis. UF goal 011m Albumin 25g ordered. Tolerated treatment fair. Patient not placed in chair prior to dialysis, this will have to done and successful prior to discharge. Next treatment scheduled for Monday.    Anemia of chronic kidney disease  Lab Results  Component Value Date   HGB 9.4 (L) 07/05/2021   Within acceptable range Epogen 4000 units with HD   3. Secondary Hyperparathyroidism:  Lab Results  Component Value Date   PTH 63 10/05/2017   PTH Comment 10/05/2017   CALCIUM 8.7 (L)  07/05/2021   CAION 1.05 (L) 07/12/2020   PHOS 3.2 07/05/2021   Phosphorus at goal Continue Sevelamer 800 mg with meals  4. Hypertension Blood Pressure  104/48 in dialysis. Hypotensive initially during treatment, managed with midodrine, minimal flushes and Albumin.    5. Diabetes mellitus type II with chronic kidney disease: insulin dependent.  Lab Results  Component Value Date   HGBA1C 8.4 (H) 07/03/2021    Glucose stable, primary team to manage SSI   LOS: [place]/23/202212:10 PM

## 2021-07-07 NOTE — Progress Notes (Signed)
[facility] PROGRESS NOTE    Ernest [name]  [id number] DOB: Nov 21, 1945 DOA: 06/26/2021 PCP: Patient, [name] (Inactive)      Brief Narrative:  Mr. Slater is a 75 y.o. M with dementia, lives in SNF, ESRD on HD MWF, sCHF, COPD on home O2, CAD, DVT, DM, HTN, and history stroke who presented with fever and worsening right foot pain.  In the ER, tachypneic, WBC 19K, lactic acid 3.1, and patient started on antibiotics and admitted.  Vascular surgery were subsequently consulted.  They performed angiography of the right leg and PCTA of the right common femoral artery on 9/14.  They felt that distal re vascularization of his gangrenous right heel wound was not possible, and recommended amputation.  The patient's POA is flying to [place] to conference with the team.          Assessment & Plan:  Sepsis due to infected right heel ulcer with calcaneal osteomyelitis and gangrene Patient presented with leukocytosis, tachypnea, as well as right calcaneal osteomyelitis.  Severe sepsis ruled out.    Vascular surgery were consulted, underwent angiography, revascularization of the right SFA, but no distal revascularization possible, likely no salvage of the right heel gangrene.  Vascular surgery recommended amputation, after discussion of goals of care with daugther/POA, son and brother, decision was made to proceed with amputation tomorrow.  He is weak but no active sepsis physiology. - Continue vancomycin and cefepime, day 11 - Consult vascular surgery for amputation tomorrow 9/24     Gout - Continue allopurinol  Paroxysmal atrial fibrillation  not on anticoagulation at baseline, normal rate - Continue amiodarone      Peripheral vascular disease Coronary artery disease status post CABG - Continue aspirin, statin - Hold Plavix for surgery   End-stage renal disease -Consult nephrology for maintenance dialysis  Chronic systolic and diastolic CHF Appears  euvolemic   Type 2 diabetes, insulin-dependent with nephropathy A1c 8.4%, glucose normal - Continue Levemir - Continue mealtime insulin and sliding scale corrections      Acute metabolic encephalopathy on vascular dementia vascular dementia Depression Patient has some subtle dementia, vascular in origin at baseline.  On the phone yesterday, daughter could tell that he was more disoriented than baseline, (he is normally able to interact, recognize daughter on the phone, aware of his surroundings and able to participate in self-cares), currently he is oriented to self only -Continue Zoloft and Remeron Delirium precautions:   -Lights and TV off, minimize interruptions at night  -Blinds open and lights on during day  -Glasses/hearing aid with patient  -Frequent reorientation  -PT/OT when able  -Avoid sedation medications/Beers list medications   Seizures No seizures since admission - Continue Keppra  Right heel pressure injury, unstageable, present on admission   Anemia of CKD   Hypokalemia Repeat BMP tomorrow          Disposition: Status is: Inpatient  Remains inpatient appropriate because:Unsafe d/c plan  Dispo: The patient is from: SNF              Anticipated d/c is to: SNF              Patient currently is not medically stable to d/c.   Difficult to place patient No       Level of care: Med-Surg       MDM: The below labs and imaging reports were reviewed and summarized above.  Medication management as above.    DVT prophylaxis: heparin injection 5,000 Units  Start: 06/27/21 0600 SCDs Start: 06/27/21 0248  Code Status: FULL Family Communication: called to [place], no voicemail possible    Consultants:  Vascular surgery Palliative care  Procedures:  9/14 angiography  Antimicrobials:  Rocephin and Zosyn x1 9/12 Vancomycin 9/12>> Flagyl 9/13-9/19 Cefepime 9/13>>  Culture data:  9/12 blood cultures negative  x2          Subjective: Patient blood pressure soft during dialysis today, down to the 70s, then improved later.  Mentation is poor still.  No fever.  No change in his pain in his foot.  No new pain complaints .  Objective: Vitals:   07/07/21 1030 07/07/21 1045 07/07/21 1100 07/07/21 1115  BP: (!) 97/45 (!) 98/31 (!) 94/36 (!) 94/50  Pulse: 61 64 62 64  Resp: (!) 28 (!) 30 18 (!) 24  Temp:      TempSrc:      SpO2: 96% 99% 99% 100%  Weight:      Height:        Intake/Output Summary (Last 24 hours) at 07/07/2021 1211 Last data filed at 07/06/2021 1850 Gross per 24 hour  Intake 0 ml  Output --  Net 0 ml   Filed Weights   07/05/21 0500 07/06/21 0505 07/07/21 0500  Weight: 88.8 kg 88.3 kg 88.2 kg    Examination: General appearance: Elderly adult male, seen on dialysis, drowsy but responds to questions with yes no answers     HEENT: Partially edentulous, lips normal, no oral lesions, oropharynx moist Skin:  Cardiac: Tachycardic, regular, no murmurs appreciated, mild lower extremity edema, bilaterally, no JVD Respiratory: Normal respiratory rate and rhythm, lungs clear without rales or wheezes Abdomen: Abdomen soft without tenderness palpation or guarding, no ascites or distention MSK:  Neuro: Awake but sleepy, gives one-word answers, these actually do not sound like they are coherent and goal-directed answers, too weak to lift his arms bilaterally legs. Psych: Seems disoriented, attention diminished     Data Reviewed: I have personally reviewed following labs and imaging studies:  CBC: Recent Labs  Lab 07/03/21 0527 07/05/21 1442  WBC 17.0* 22.2*  HGB 8.9* 9.4*  HCT 27.1* 28.6*  MCV 86.9 87.2  PLT 307 [id number]   Basic Metabolic Panel: Recent Labs  Lab 07/03/21 0527 07/05/21 1442  NA 135 136  K 4.0 3.1*  CL 99 96*  CO2 25 27  GLUCOSE 237* 171*  BUN 75* 25*  CREATININE 8.60* 3.31*  CALCIUM 8.7* 8.7*  MG 2.2  --   PHOS 7.0* 3.2   GFR: Estimated  Creatinine Clearance: 22.2 mL/min (A) (by C-G formula based on SCr of 3.31 mg/dL (H)). Liver Function Tests: Recent Labs  Lab 07/03/21 0527  ALBUMIN 2.3*   No results for input(s): LIPASE, AMYLASE in the last 168 hours. No results for input(s): AMMONIA in the last 168 hours. Coagulation Profile: No results for input(s): INR, PROTIME in the last 168 hours.  Cardiac Enzymes: No results for input(s): CKTOTAL, CKMB, CKMBINDEX, TROPONINI in the last 168 hours. BNP (last 3 results) No results for input(s): PROBNP in the last 8760 hours. HbA1C: No results for input(s): HGBA1C in the last 72 hours. CBG: Recent Labs  Lab 07/05/21 2146 07/06/21 0829 07/06/21 1143 07/06/21 1701 07/06/21 2050  GLUCAP 197* 129* 123* 109* 101*   Lipid Profile: No results for input(s): CHOL, HDL, LDLCALC, TRIG, CHOLHDL, LDLDIRECT in the last 72 hours. Thyroid Function Tests: No results for input(s): TSH, T4TOTAL, FREET4, T3FREE, THYROIDAB in the last 72  hours. Anemia Panel: No results for input(s): VITAMINB12, FOLATE, FERRITIN, TIBC, IRON, RETICCTPCT in the last 72 hours. Urine analysis:    Component Value Date/Time   COLORURINE YELLOW 04/29/2015 1702   APPEARANCEUR CLEAR 04/29/2015 1702   LABSPEC 1.016 04/29/2015 1702   PHURINE 7.5 04/29/2015 1702   GLUCOSEU >1000 (A) 04/29/2015 1702   HGBUR SMALL (A) 04/29/2015 1702   BILIRUBINUR NEGATIVE 04/29/2015 1702   KETONESUR NEGATIVE 04/29/2015 1702   PROTEINUR >300 (A) 04/29/2015 1702   UROBILINOGEN 0.2 04/29/2015 1702   NITRITE NEGATIVE 04/29/2015 1702   LEUKOCYTESUR NEGATIVE 04/29/2015 1702   Sepsis Labs: '@LABRCNTIP'$ (procalcitonin:4,lacticacidven:4)  ) Recent Results (from the past 240 hour(s))  MRSA Next Gen by PCR, Nasal     Status: None   Collection Time: 06/28/21  5:31 AM   Specimen: Nasal Mucosa; Nasal Swab  Result Value Ref Range Status   MRSA by PCR Next Gen NOT DETECTED NOT DETECTED Final    Comment: (NOTE) The GeneXpert MRSA Assay  (FDA approved for NASAL specimens only), is one component of a comprehensive MRSA colonization surveillance program. It is not intended to diagnose MRSA infection nor to guide or monitor treatment for MRSA infections. Test performance is not FDA approved in patients less than 51 years old. Performed at [facility], [address], [place], [place] [address]          Radiology Studies: No results found.      Scheduled Meds:  acetaminophen  1,000 mg Oral TID   allopurinol  100 mg Oral Daily   amiodarone  200 mg Oral Daily   aspirin EC  81 mg Oral Daily   atorvastatin  40 mg Oral Daily   Chlorhexidine Gluconate Cloth  6 each Topical Q0600   clopidogrel  75 mg Oral Q breakfast   docusate sodium  100 mg Oral Daily   epoetin (EPOGEN/PROCRIT) injection  10,000 Units Intravenous Q M,W,F-HD   heparin injection (subcutaneous)  5,000 Units Subcutaneous Q8H   heparin sodium (porcine)       insulin aspart  0-5 Units Subcutaneous QHS   insulin aspart  0-6 Units Subcutaneous TID WC   insulin aspart  2 Units Subcutaneous TID WC   insulin detemir  10 Units Subcutaneous QHS   levETIRAcetam  500 mg Oral BID   levETIRAcetam  500 mg Oral Once per day on Mon Wed Fri   midodrine       midodrine       mirtazapine  7.5 mg Oral QHS   senna-docusate  1 tablet Oral QHS   sertraline  25 mg Oral QHS   sevelamer carbonate  800 mg Oral TID WC   sodium chloride flush  3 mL Intravenous Q12H   Continuous Infusions:  sodium chloride     ceFEPime (MAXIPIME) IV 1 g (07/06/21 1104)   vancomycin 1,000 mg (07/05/21 1631)     LOS: 10 days    Time spent: 25 minutes    Edwin Dada, MD [facility] 07/07/2021, 12:11 PM     Please page though [place] or Epic secure chat:  For [employer], Adult nurse

## 2021-07-08 ENCOUNTER — Inpatient Hospital Stay: Payer: Medicare (Managed Care) | Admitting: Certified Registered"

## 2021-07-08 ENCOUNTER — Encounter
Admission: EM | Disposition: A | Discharge status: Skilled Nursing Facility | Payer: Medicare (Managed Care) | Source: Home / Self Care | Attending: Student

## 2021-07-08 DIAGNOSIS — I9581 Postprocedural hypotension: Secondary | ICD-10-CM | POA: Diagnosis not present

## 2021-07-08 DIAGNOSIS — I96 Gangrene, not elsewhere classified: Secondary | ICD-10-CM | POA: Diagnosis not present

## 2021-07-08 DIAGNOSIS — A419 Sepsis, unspecified organism: Secondary | ICD-10-CM | POA: Diagnosis not present

## 2021-07-08 DIAGNOSIS — I739 Peripheral vascular disease, unspecified: Secondary | ICD-10-CM | POA: Diagnosis present

## 2021-07-08 DIAGNOSIS — I70269 Atherosclerosis of native arteries of extremities with gangrene, unspecified extremity: Secondary | ICD-10-CM | POA: Diagnosis present

## 2021-07-08 HISTORY — PX: AMPUTATION: SHX166

## 2021-07-08 LAB — BASIC METABOLIC PANEL
Anion gap: 13 (ref 5–15)
BUN: 31 mg/dL — ABNORMAL HIGH (ref 8–23)
CO2: 30 mmol/L (ref 22–32)
Calcium: 9.2 mg/dL (ref 8.9–10.3)
Chloride: 95 mmol/L — ABNORMAL LOW (ref 98–111)
Creatinine, Ser: 4.39 mg/dL — ABNORMAL HIGH (ref 0.61–1.24)
GFR, Estimated: 14 mL/min — ABNORMAL LOW (ref >60)
Glucose, Bld: 64 mg/dL — ABNORMAL LOW (ref 70–99)
Potassium: 3.4 mmol/L — ABNORMAL LOW (ref 3.5–5.1)
Sodium: 138 mmol/L (ref 135–145)

## 2021-07-08 LAB — TROPONIN I (HIGH SENSITIVITY)
Troponin I (High Sensitivity): 36 ng/L — ABNORMAL HIGH (ref <18)
Troponin I (High Sensitivity): 31 ng/L — ABNORMAL HIGH (ref <18)

## 2021-07-08 LAB — CBC
HCT: 26.2 % — ABNORMAL LOW (ref 39.0–52.0)
HCT: 29.9 % — ABNORMAL LOW (ref 39.0–52.0)
Hemoglobin: 8.6 g/dL — ABNORMAL LOW (ref 13.0–17.0)
Hemoglobin: 9.6 g/dL — ABNORMAL LOW (ref 13.0–17.0)
MCH: 28.7 pg (ref 26.0–34.0)
MCH: 28.1 pg (ref 26.0–34.0)
MCHC: 32.8 g/dL (ref 30.0–36.0)
MCHC: 32.1 g/dL (ref 30.0–36.0)
MCV: 87.3 fL (ref 80.0–100.0)
MCV: 87.4 fL (ref 80.0–100.0)
Platelets: 232 10*3/uL (ref 150–400)
Platelets: 335 10*3/uL (ref 150–400)
RBC: 3 MIL/uL — ABNORMAL LOW (ref 4.22–5.81)
RBC: 3.42 MIL/uL — ABNORMAL LOW (ref 4.22–5.81)
RDW: 16.4 % — ABNORMAL HIGH (ref 11.5–15.5)
RDW: 16.7 % — ABNORMAL HIGH (ref 11.5–15.5)
WBC: 14.1 10*3/uL — ABNORMAL HIGH (ref 4.0–10.5)
WBC: 22.7 10*3/uL — ABNORMAL HIGH (ref 4.0–10.5)
nRBC: 0 % (ref 0.0–0.2)
nRBC: 0 % (ref 0.0–0.2)

## 2021-07-08 LAB — APTT: aPTT: 61 seconds — ABNORMAL HIGH (ref 24–36)

## 2021-07-08 LAB — PHOSPHORUS: Phosphorus: 4.3 mg/dL (ref 2.5–4.6)

## 2021-07-08 LAB — GLUCOSE, CAPILLARY
Glucose-Capillary: 48 mg/dL — ABNORMAL LOW (ref 70–99)
Glucose-Capillary: 114 mg/dL — ABNORMAL HIGH (ref 70–99)
Glucose-Capillary: 111 mg/dL — ABNORMAL HIGH (ref 70–99)
Glucose-Capillary: 108 mg/dL — ABNORMAL HIGH (ref 70–99)
Glucose-Capillary: 141 mg/dL — ABNORMAL HIGH (ref 70–99)
Glucose-Capillary: 183 mg/dL — ABNORMAL HIGH (ref 70–99)

## 2021-07-08 LAB — MAGNESIUM: Magnesium: 2 mg/dL (ref 1.7–2.4)

## 2021-07-08 LAB — PROTIME-INR
INR: 1.3 — ABNORMAL HIGH (ref 0.8–1.2)
Prothrombin Time: 16.5 seconds — ABNORMAL HIGH (ref 11.4–15.2)

## 2021-07-08 LAB — CREATININE, SERUM
Creatinine, Ser: 5.13 mg/dL — ABNORMAL HIGH (ref 0.61–1.24)
GFR, Estimated: 11 mL/min — ABNORMAL LOW (ref >60)

## 2021-07-08 SURGERY — AMPUTATION, ABOVE KNEE
Anesthesia: General | Site: Knee | Laterality: Right

## 2021-07-08 MED ORDER — CEFAZOLIN SODIUM-DEXTROSE 1-4 GM/50ML-% IV SOLN: 1.0000 g | INTRAVENOUS | Status: DC

## 2021-07-08 MED ORDER — ALBUMIN HUMAN 25 % IV SOLN
12.5000 g | Freq: Once | INTRAVENOUS | Status: AC
  Administered 2021-07-08: 12.5 g via INTRAVENOUS
  Filled 2021-07-08: qty 50

## 2021-07-08 MED ORDER — DEXAMETHASONE SODIUM PHOSPHATE 10 MG/ML IJ SOLN
INTRAMUSCULAR | Status: AC
  Filled 2021-07-08: qty 1

## 2021-07-08 MED ORDER — PANTOPRAZOLE SODIUM 40 MG PO TBEC
40.0000 mg | DELAYED_RELEASE_TABLET | Freq: Every day | ORAL | Status: DC
  Administered 2021-07-09 – 2021-07-20 (×11): 40 mg via ORAL
  Filled 2021-07-08 (×12): qty 1

## 2021-07-08 MED ORDER — PHENYLEPHRINE 40 MCG/ML (10ML) SYRINGE FOR IV PUSH (FOR BLOOD PRESSURE SUPPORT)
PREFILLED_SYRINGE | INTRAVENOUS | Status: AC
  Filled 2021-07-08: qty 10

## 2021-07-08 MED ORDER — DEXTROSE 50 % IV SOLN: 25.0000 g | INTRAVENOUS | Status: AC

## 2021-07-08 MED ORDER — GUAIFENESIN-DM 100-10 MG/5ML PO SYRP
15.0000 mL | ORAL_SOLUTION | ORAL | Status: DC | PRN
  Administered 2021-07-09: 15 mL via ORAL
  Filled 2021-07-08: qty 15

## 2021-07-08 MED ORDER — SODIUM CHLORIDE 0.9 % IV BOLUS
250.0000 mL | Freq: Once | INTRAVENOUS | Status: AC
  Administered 2021-07-08: 250 mL via INTRAVENOUS

## 2021-07-08 MED ORDER — FENTANYL CITRATE (PF) 100 MCG/2ML IJ SOLN
INTRAMUSCULAR | Status: AC
  Filled 2021-07-08: qty 2

## 2021-07-08 MED ORDER — SODIUM CHLORIDE 0.9 % IV SOLN
0.0000 ug/min | INTRAVENOUS | Status: DC
  Filled 2021-07-08: qty 2

## 2021-07-08 MED ORDER — EPHEDRINE SULFATE 50 MG/ML IJ SOLN
INTRAMUSCULAR | Status: DC | PRN
  Administered 2021-07-08 (×3): 10 mg via INTRAVENOUS

## 2021-07-08 MED ORDER — MIDODRINE HCL 5 MG PO TABS
10.0000 mg | ORAL_TABLET | Freq: Three times a day (TID) | ORAL | Status: DC
  Administered 2021-07-08 – 2021-07-12 (×8): 10 mg via ORAL
  Filled 2021-07-08 (×7): qty 2

## 2021-07-08 MED ORDER — DEXTROSE 50 % IV SOLN
INTRAVENOUS | Status: AC
  Administered 2021-07-08: 50 mL
  Filled 2021-07-08: qty 50

## 2021-07-08 MED ORDER — PHENYLEPHRINE HCL (PRESSORS) 10 MG/ML IV SOLN
INTRAVENOUS | Status: AC
  Filled 2021-07-08: qty 1

## 2021-07-08 MED ORDER — PHENYLEPHRINE HCL (PRESSORS) 10 MG/ML IV SOLN
INTRAVENOUS | Status: DC | PRN
Start: 2021-07-08 — End: 2021-07-08
  Administered 2021-07-08 (×2): 200 ug via INTRAVENOUS
  Administered 2021-07-08: .2 mg via INTRAVENOUS

## 2021-07-08 MED ORDER — PHENYLEPHRINE HCL (PRESSORS) 10 MG/ML IV SOLN
INTRAVENOUS | Status: AC
  Filled 2021-07-08: qty 2

## 2021-07-08 MED ORDER — LIDOCAINE HCL (CARDIAC) PF 100 MG/5ML IV SOSY
PREFILLED_SYRINGE | INTRAVENOUS | Status: DC | PRN
  Administered 2021-07-08: 100 mg via INTRAVENOUS

## 2021-07-08 MED ORDER — PHENOL 1.4 % MT LIQD
1.0000 | OROMUCOSAL | Status: DC | PRN
  Filled 2021-07-08: qty 177

## 2021-07-08 MED ORDER — ALUM & MAG HYDROXIDE-SIMETH 200-200-20 MG/5ML PO SUSP: 15.0000 mL | ORAL | Status: DC | PRN

## 2021-07-08 MED ORDER — HYDROCODONE-ACETAMINOPHEN 7.5-325 MG PO TABS
1.0000 | ORAL_TABLET | Freq: Once | ORAL | Status: DC | PRN
  Filled 2021-07-08: qty 1

## 2021-07-08 MED ORDER — MIDAZOLAM HCL 2 MG/2ML IJ SOLN
INTRAMUSCULAR | Status: AC
  Filled 2021-07-08: qty 2

## 2021-07-08 MED ORDER — SUCCINYLCHOLINE CHLORIDE 200 MG/10ML IV SOSY
PREFILLED_SYRINGE | INTRAVENOUS | Status: DC | PRN
  Administered 2021-07-08: 120 mg via INTRAVENOUS

## 2021-07-08 MED ORDER — SODIUM CHLORIDE 0.9 % IV SOLN
INTRAVENOUS | Status: DC | PRN
  Administered 2021-07-08: 60 ug/min via INTRAVENOUS

## 2021-07-08 MED ORDER — POTASSIUM CHLORIDE CRYS ER 20 MEQ PO TBCR
20.0000 meq | EXTENDED_RELEASE_TABLET | Freq: Once | ORAL | Status: DC
  Filled 2021-07-08: qty 1

## 2021-07-08 MED ORDER — MAGNESIUM SULFATE 2 GM/50ML IV SOLN
2.0000 g | Freq: Every day | INTRAVENOUS | Status: DC | PRN
  Filled 2021-07-08: qty 50

## 2021-07-08 MED ORDER — POTASSIUM CHLORIDE CRYS ER 20 MEQ PO TBCR
20.0000 meq | EXTENDED_RELEASE_TABLET | Freq: Every day | ORAL | Status: AC | PRN
  Administered 2021-07-08: 40 meq via ORAL

## 2021-07-08 MED ORDER — ONDANSETRON HCL 4 MG/2ML IJ SOLN: 4.0000 mg | Freq: Once | INTRAMUSCULAR | Status: DC | PRN

## 2021-07-08 MED ORDER — CHLORHEXIDINE GLUCONATE CLOTH 2 % EX PADS
6.0000 | MEDICATED_PAD | Freq: Every day | CUTANEOUS | Status: DC
  Administered 2021-07-09 – 2021-07-20 (×12): 6 via TOPICAL

## 2021-07-08 MED ORDER — SODIUM CHLORIDE 0.9 % IV SOLN: INTRAVENOUS | Status: DC | PRN

## 2021-07-08 MED ORDER — CHLORHEXIDINE GLUCONATE CLOTH 2 % EX PADS
6.0000 | MEDICATED_PAD | Freq: Once | CUTANEOUS | Status: AC
  Administered 2021-07-08: 6 via TOPICAL

## 2021-07-08 MED ORDER — HYDRALAZINE HCL 20 MG/ML IJ SOLN: 5.0000 mg | INTRAMUSCULAR | Status: DC | PRN

## 2021-07-08 MED ORDER — ROCURONIUM BROMIDE 100 MG/10ML IV SOLN
INTRAVENOUS | Status: DC | PRN
  Administered 2021-07-08: 40 mg via INTRAVENOUS

## 2021-07-08 MED ORDER — 0.9 % SODIUM CHLORIDE (POUR BTL) OPTIME
TOPICAL | Status: DC | PRN
  Administered 2021-07-08: 1000 mL

## 2021-07-08 MED ORDER — FENTANYL CITRATE (PF) 100 MCG/2ML IJ SOLN
INTRAMUSCULAR | Status: DC | PRN
  Administered 2021-07-08: 50 ug via INTRAVENOUS

## 2021-07-08 MED ORDER — PROPOFOL 10 MG/ML IV BOLUS
INTRAVENOUS | Status: DC | PRN
  Administered 2021-07-08: 100 mg via INTRAVENOUS

## 2021-07-08 MED ORDER — DEXAMETHASONE SODIUM PHOSPHATE 10 MG/ML IJ SOLN
INTRAMUSCULAR | Status: DC | PRN
  Administered 2021-07-08: 4 mg via INTRAVENOUS

## 2021-07-08 MED ORDER — PHENYLEPHRINE HCL-NACL 20-0.9 MG/250ML-% IV SOLN
25.0000 ug/min | INTRAVENOUS | Status: DC
  Administered 2021-07-08: 90 ug/min via INTRAVENOUS
  Administered 2021-07-08: 100 ug/min via INTRAVENOUS
  Administered 2021-07-09: 30 ug/min via INTRAVENOUS
  Filled 2021-07-08 (×4): qty 250

## 2021-07-08 MED ORDER — SODIUM CHLORIDE FLUSH 0.9 % IV SOLN
INTRAVENOUS | Status: AC
  Administered 2021-07-08: 3 mL via INTRAVENOUS
  Filled 2021-07-08: qty 9

## 2021-07-08 MED ORDER — PHENYLEPHRINE HCL-NACL 20-0.9 MG/250ML-% IV SOLN
0.0000 ug/min | INTRAVENOUS | Status: DC
  Administered 2021-07-08: 90 ug/min via INTRAVENOUS
  Administered 2021-07-08: 80 ug/min via INTRAVENOUS
  Filled 2021-07-08: qty 250

## 2021-07-08 MED ORDER — LABETALOL HCL 5 MG/ML IV SOLN
10.0000 mg | INTRAVENOUS | Status: DC | PRN
Start: 2021-07-08 — End: 2021-07-21

## 2021-07-08 MED ORDER — SUGAMMADEX SODIUM 200 MG/2ML IV SOLN
INTRAVENOUS | Status: DC | PRN
  Administered 2021-07-08: 200 mg via INTRAVENOUS

## 2021-07-08 MED ORDER — ONDANSETRON HCL 4 MG/2ML IJ SOLN
INTRAMUSCULAR | Status: AC
  Filled 2021-07-08: qty 2

## 2021-07-08 MED ORDER — PROPOFOL 10 MG/ML IV BOLUS
INTRAVENOUS | Status: AC
  Filled 2021-07-08: qty 20

## 2021-07-08 MED ORDER — HYDROMORPHONE HCL 1 MG/ML IJ SOLN: 0.2500 mg | INTRAMUSCULAR | Status: DC | PRN

## 2021-07-08 MED ORDER — METOPROLOL TARTRATE 5 MG/5ML IV SOLN: 2.0000 mg | INTRAVENOUS | Status: DC | PRN

## 2021-07-08 SURGICAL SUPPLY — 43 items
BLADE SAGITTAL WIDE XTHICK NO (BLADE) ×2 IMPLANT
BLADE SURG 10 STRL SS SAFETY (BLADE) ×2 IMPLANT
BNDG COHESIVE 4X5 TAN ST LF (GAUZE/BANDAGES/DRESSINGS) ×2 IMPLANT
BNDG ELASTIC 6X5.8 VLCR NS LF (GAUZE/BANDAGES/DRESSINGS) ×2 IMPLANT
BNDG ELASTIC 6X5.8 VLCR STR LF (GAUZE/BANDAGES/DRESSINGS) ×2 IMPLANT
BNDG GAUZE ELAST 4 BULKY (GAUZE/BANDAGES/DRESSINGS) ×4 IMPLANT
BNDG STRETCH 4X75 STRL LF (GAUZE/BANDAGES/DRESSINGS) ×2 IMPLANT
BRUSH SCRUB EZ  4% CHG (MISCELLANEOUS) ×1
BRUSH SCRUB EZ 4% CHG (MISCELLANEOUS) ×1 IMPLANT
CHLORAPREP W/TINT 26 (MISCELLANEOUS) ×2 IMPLANT
DRAPE INCISE IOBAN 66X45 STRL (DRAPES) ×2 IMPLANT
DRAPE INCISE IOBAN 66X60 STRL (DRAPES) ×2 IMPLANT
ELECT CAUTERY BLADE 6.4 (BLADE) ×2 IMPLANT
ELECT REM PT RETURN 9FT ADLT (ELECTROSURGICAL) ×2
ELECTRODE REM PT RTRN 9FT ADLT (ELECTROSURGICAL) ×1 IMPLANT
GAUZE 4X4 16PLY ~~LOC~~+RFID DBL (SPONGE) ×2 IMPLANT
GAUZE SPONGE 4X4 12PLY STRL (GAUZE/BANDAGES/DRESSINGS) ×2 IMPLANT
GAUZE XEROFORM 1X8 LF (GAUZE/BANDAGES/DRESSINGS) ×2 IMPLANT
GLOVE SURG ENC MOIS LTX SZ7 (GLOVE) ×2 IMPLANT
GLOVE SURG SYN 7.0 (GLOVE) ×2 IMPLANT
GLOVE SURG UNDER LTX SZ7.5 (GLOVE) ×2 IMPLANT
GOWN STRL REUS W/ TWL LRG LVL3 (GOWN DISPOSABLE) ×1 IMPLANT
GOWN STRL REUS W/ TWL XL LVL3 (GOWN DISPOSABLE) ×2 IMPLANT
GOWN STRL REUS W/TWL LRG LVL3 (GOWN DISPOSABLE) ×1
GOWN STRL REUS W/TWL XL LVL3 (GOWN DISPOSABLE) ×2
HANDLE YANKAUER SUCT BULB TIP (MISCELLANEOUS) ×2 IMPLANT
KIT TURNOVER KIT A (KITS) ×2 IMPLANT
LABEL OR SOLS (LABEL) ×2 IMPLANT
MANIFOLD NEPTUNE II (INSTRUMENTS) ×2 IMPLANT
NS IRRIG 1000ML POUR BTL (IV SOLUTION) ×2 IMPLANT
PACK EXTREMITY ARMC (MISCELLANEOUS) ×2 IMPLANT
PAD ABD DERMACEA PRESS 5X9 (GAUZE/BANDAGES/DRESSINGS) ×2 IMPLANT
PAD PREP 24X41 OB/GYN DISP (PERSONAL CARE ITEMS) ×2 IMPLANT
SHEARS FOC LG CVD HARMONIC 17C (MISCELLANEOUS) ×2 IMPLANT
SPONGE T-LAP 18X18 ~~LOC~~+RFID (SPONGE) ×4 IMPLANT
STAPLER SKIN PROX 35W (STAPLE) ×2 IMPLANT
STOCKINETTE M/LG 89821 (MISCELLANEOUS) ×2 IMPLANT
SUT SILK 2 0 (SUTURE) ×1
SUT SILK 2 0 SH (SUTURE) ×4 IMPLANT
SUT SILK 2-0 18XBRD TIE 12 (SUTURE) ×1 IMPLANT
SUT VIC AB 0 CT1 36 (SUTURE) ×4 IMPLANT
SUT VIC AB 2-0 CT1 (SUTURE) ×4 IMPLANT
WATER STERILE IRR 500ML POUR (IV SOLUTION) ×2 IMPLANT

## 2021-07-08 NOTE — Progress Notes (Signed)
[facility]  PROGRESS NOTE   Subjective:   Patient seen at bedside.  Objective:  Vital signs in last 24 hours:  Temp:  [97.5 F (36.4 C)-98.5 F (36.9 C)] 98.2 F (36.8 C) (09/24 0806) Pulse Rate:  [57-68] 57 (09/24 0806) Resp:  [12-20] 12 (09/24 0806) BP: (115-132)/(48-54) 115/48 (09/24 0806) SpO2:  [99 %-100 %] 99 % (09/24 0806)  Weight change:  Filed Weights   07/05/21 0500 07/06/21 0505 07/07/21 0500  Weight: 88.8 kg 88.3 kg 88.2 kg    Intake/Output: I/O last 3 completed shifts: In: -  Out: -400    Intake/Output this shift:  Total I/O In: 300 [I.V.:300] Out: -   Physical Exam: General:  No acute distress  Head:  Normocephalic, atraumatic. Moist oral mucosal membranes  Eyes:  Anicteric  Neck:  Supple  Lungs:   Clear to auscultation, normal effort  Heart:  S1S2 no rubs  Abdomen:   Soft, nontender, bowel sounds present  Extremities: Right foot ulcer.  Neurologic:  Awake, alert, following commands  Skin:  No lesions  Access:     Basic Metabolic Panel: Recent Labs  Lab 07/03/21 0527 07/05/21 1442 07/08/21 0540  NA 135 136 138  K 4.0 3.1* 3.4*  CL 99 96* 95*  CO2 '25 27 30  '$ GLUCOSE 237* 171* 64*  BUN 75* 25* 31*  CREATININE 8.60* 3.31* 4.39*  CALCIUM 8.7* 8.7* 9.2  MG 2.2  --  2.0  PHOS 7.0* 3.2 4.3    CBC: Recent Labs  Lab 07/03/21 0527 07/05/21 1442 07/08/21 0540  WBC 17.0* 22.2* 14.1*  HGB 8.9* 9.4* 8.6*  HCT 27.1* 28.6* 26.2*  MCV 86.9 87.2 87.3  PLT 307 340 232     Urinalysis: No results for input(s): COLORURINE, LABSPEC, PHURINE, GLUCOSEU, HGBUR, BILIRUBINUR, KETONESUR, PROTEINUR, UROBILINOGEN, NITRITE, LEUKOCYTESUR in the last 72 hours.  Invalid input(s): APPERANCEUR    Imaging: No results found.   Medications:    [MAR Hold] sodium chloride     [MAR Hold] ceFEPime (MAXIPIME) IV 1 g (07/08/21 0943)   [MAR Hold] vancomycin Stopped (07/07/21 1654)    [MAR Hold] acetaminophen  1,000 mg Oral TID   [MAR Hold]  allopurinol  100 mg Oral Daily   [MAR Hold] amiodarone  200 mg Oral Daily   [MAR Hold] aspirin EC  81 mg Oral Daily   [MAR Hold] atorvastatin  40 mg Oral Daily   [MAR Hold] Chlorhexidine Gluconate Cloth  6 each Topical Q0600   [MAR Hold] docusate sodium  100 mg Oral Daily   [MAR Hold] epoetin (EPOGEN/PROCRIT) injection  10,000 Units Intravenous Q M,W,F-HD   [MAR Hold] heparin injection (subcutaneous)  5,000 Units Subcutaneous Q8H   [MAR Hold] insulin aspart  0-5 Units Subcutaneous QHS   [MAR Hold] insulin aspart  0-6 Units Subcutaneous TID WC   [MAR Hold] levETIRAcetam  500 mg Oral BID   [MAR Hold] levETIRAcetam  500 mg Oral Once per day on Mon Wed Fri   [facility] Hold] mirtazapine  7.5 mg Oral QHS   [MAR Hold] senna-docusate  1 tablet Oral QHS   [MAR Hold] sertraline  25 mg Oral QHS   [MAR Hold] sevelamer carbonate  800 mg Oral TID WC   [MAR Hold] sodium chloride flush  3 mL Intravenous Q12H    Assessment/ Plan:     Active Problems:   Sepsis ([place])  75 year old male with history of hypertension, coronary artery disease, COPD, congestive heart failure, hepatitis C, hyperlipidemia, ESRD on hemodialysis  Monday Wednesday Friday.  He is now being admitted with history of acute respiratory failure and also sepsis.  #1: ESRD: Patient had stable dialysis yesterday.  Next dialysis is scheduled for Monday.  #2: Right lower extremity osteomyelitis: S/p right lower extremity angiogram.  Vascular note appreciated and patient is scheduled for above-the-knee amputation.  #3: Anemia: Continue Epogen and anemia protocols.  #4: Secondary hyperparathyroidism: We will continue to monitor and continue the sevelamer.  #5: Hypertension: Blood pressure is well controlled with present antihypertensive regimen.  Will continue to follow closely and give recommendations as required.   LOS: [place], [facility] 9/24/20222:04 PM

## 2021-07-08 NOTE — Anesthesia Postprocedure Evaluation (Signed)
Anesthesia Post Note  Patient: Ernest [name]  Procedure(s) Performed: AMPUTATION ABOVE KNEE (Right: Knee)  Patient location during evaluation: PACU Anesthesia Type: General Level of consciousness: awake Pain management: pain level controlled Vital Signs Assessment: vitals unstable Respiratory status: spontaneous breathing Comments: RN aware of pressure issues and low O2 without oxygen.     No notable events documented.   Last Vitals:  Vitals:   07/08/21 0806 07/08/21 1457  BP: (!) 115/48 (!) 88/34  Pulse: (!) 57 84  Resp: 12 12  Temp: 36.8 C   SpO2: 99% 96%    Last Pain:  Vitals:   07/08/21 0920  TempSrc:   PainSc: 0-No pain                 Claiborne Billings Magin Balbi

## 2021-07-08 NOTE — Progress Notes (Signed)
[facility] PROGRESS NOTE    Ernest [name]  [id number] DOB: Oct 14, 1946 DOA: 06/26/2021 PCP: Patient, [name] (Inactive)      Brief Narrative:  [name] is a 75 y.o. M with dementia, lives in SNF, ESRD on HD MWF, sCHF, COPD on home O2, CAD, DVT, DM, HTN, and history stroke who presented with fever and worsening right foot pain.  In the ER, tachypneic, WBC 19K, lactic acid 3.1, and patient started on antibiotics and admitted.  Vascular surgery were subsequently consulted.  They performed angiography of the right leg and PCTA of the right common femoral artery on 9/14.  They felt that distal re vascularization of his gangrenous right heel wound was not possible, and recommended amputation.  The patient's POA is flying to [place] to conference with the team.          Assessment & Plan:  Sepsis due to infected right heel ulcer with calcaneal osteomyelitis and gangrene Patient presented with leukocytosis, tachypnea, as well as right calcaneal osteomyelitis.  Severe sepsis ruled out.    Vascular surgery were consulted, underwent angiography, revascularization of the right SFA, but no distal revascularization possible, likely no salvage of the right heel gangrene.  Vascular surgery recommended amputation, and after a delayed discussion of goals of care with daugther/POA, son and brother, decision was made to proceed with amputation.  He remains weak and somewhat encephalopathic from his gangrene, but no active sepsis physiology.  To OR for amputation today - Continue vancomycin and cefepime, day 11 - Consult vascular surgery     Gout - Continue allopurinol  Paroxysmal atrial fibrillation  not on anticoagulation at baseline, rate controlled - Continue amiodarone      Peripheral vascular disease Coronary artery disease status post CABG - Continue aspirin, statin - Resume Plavix after surgery   End-stage renal disease -Consult nephrology for  maintenance dialysis  Chronic systolic and diastolic CHF Appears euvolemic   Type 2 diabetes, insulin-dependent with nephropathy A1c 8.4%, glucose this morning - Hold Levemir today, resume tomorrow - Continue sliding scale corrections     Acute metabolic encephalopathy on vascular dementia vascular dementia Depression Patient has some subtle dementia, vascular in origin at baseline.  On the phone yesterday, daughter could tell that he was more disoriented than baseline, (he is normally able to interact, recognize daughter on the phone, aware of his surroundings and able to participate in self-cares), currently he is oriented to self only -Continue Zoloft and Remeron Delirium precautions:   -Lights and TV off, minimize interruptions at night  -Blinds open and lights on during day  -Glasses/hearing aid with patient  -Frequent reorientation  -PT/OT when able  -Avoid sedation medications/Beers list medications   Seizures No seizures since admission - Continue Keppra  Right heel pressure injury rule out, heel ulcer is ischemic   Anemia of CKD  Hemoglobin stable  Hypokalemia Mild - Supplement K          Disposition: Status is: Inpatient  Remains inpatient appropriate because:Unsafe d/c plan  Dispo: The patient is from: SNF              Anticipated d/c is to: SNF              Patient currently is not medically stable to d/c.   Difficult to place patient No   Level of care: Med-Surg    Patient was admitted with gangrene of the right heel.  Vascular surgery felt no revascularization was possible and  the gangrene required amputation.  There was some delay in decisionmaking regarding amputation, due to hesitance by the patient, but he became more encephalopathic, family affirmed his wishes would have ben for life prolonging therapies, and amputation was arranged for today.  Post op, we will continue antibiotics for 48 hours, then work towards placement for rehab         MDM: The below labs and imaging reports were reviewed and summarized above.  Medication management as above.    DVT prophylaxis: SCD's Start: 07/08/21 0951 heparin injection 5,000 Units Start: 06/27/21 0600 SCDs Start: 06/27/21 0248  Code Status: FULL Family Communication:      Consultants:  Vascular surgery Palliative care  Procedures:  9/14 angiography  Antimicrobials:  Rocephin and Zosyn x1 9/12 Vancomycin 9/12>> Flagyl 9/13-9/19 Cefepime 9/13>>  Culture data:  9/12 blood cultures negative x2          Subjective: Patient remains somewhat encephalopathic.  He wakes and looks at me but he is very sleepy.  No fever, no pain complaints, no respiratory distress. Objective: Vitals:   07/07/21 1955 07/08/21 0434 07/08/21 0439 07/08/21 0806  BP: (!) 126/51 (!) 115/49 (!) 115/49 (!) 115/48  Pulse: 66 60 60 (!) 57  Resp: '18 20 20 12  '$ Temp: 98.5 F (36.9 C) (!) 97.5 F (36.4 C) (!) 97.5 F (36.4 C) 98.2 F (36.8 C)  TempSrc: Oral Oral Oral Oral  SpO2: 100%  100% 99%  Weight:      Height:        Intake/Output Summary (Last 24 hours) at 07/08/2021 1404 Last data filed at 07/08/2021 1304 Gross per 24 hour  Intake 300 ml  Output --  Net 300 ml   Filed Weights   07/05/21 0500 07/06/21 0505 07/07/21 0500  Weight: 88.8 kg 88.3 kg 88.2 kg    Examination: General appearance: Elderly adult male, sleepy, confused    HEENT: Partially edentulous, lips normal, no oral lesions, oropharynx moist Skin:  Cardiac: Heart rate normal, no murmurs, mild peripheral edema, nonpitting Respiratory: Normal respiratory rate and rhythm, lungs clear without rales or wheezes Abdomen: Abdomen soft that tenderness palpation or guarding, no ascites or distention MSK:  Neuro: Awake but sleepy, gives what I think are inappropriate one-word answers, I do not think he is oriented to self or situation.  Moves upper extremities with generalized weakness.  Legs too weak to lift  up, face symmetric. Psych: Attention diminished, disoriented.    Data Reviewed: I have personally reviewed following labs and imaging studies:  CBC: Recent Labs  Lab 07/03/21 0527 07/05/21 1442 07/08/21 0540  WBC 17.0* 22.2* 14.1*  HGB 8.9* 9.4* 8.6*  HCT 27.1* 28.6* 26.2*  MCV 86.9 87.2 87.3  PLT 307 340 [id number]   Basic Metabolic Panel: Recent Labs  Lab 07/03/21 0527 07/05/21 1442 07/08/21 0540  NA 135 136 138  K 4.0 3.1* 3.4*  CL 99 96* 95*  CO2 '25 27 30  '$ GLUCOSE 237* 171* 64*  BUN 75* 25* 31*  CREATININE 8.60* 3.31* 4.39*  CALCIUM 8.7* 8.7* 9.2  MG 2.2  --  2.0  PHOS 7.0* 3.2 4.3   GFR: Estimated Creatinine Clearance: 16.7 mL/min (A) (by C-G formula based on SCr of 4.39 mg/dL (H)). Liver Function Tests: Recent Labs  Lab 07/03/21 0527  ALBUMIN 2.3*   No results for input(s): LIPASE, AMYLASE in the last 168 hours. No results for input(s): AMMONIA in the last 168 hours. Coagulation Profile: Recent Labs  Lab 07/08/21 0540  INR 1.3*    Cardiac Enzymes: No results for input(s): CKTOTAL, CKMB, CKMBINDEX, TROPONINI in the last 168 hours. BNP (last 3 results) No results for input(s): PROBNP in the last 8760 hours. HbA1C: No results for input(s): HGBA1C in the last 72 hours. CBG: Recent Labs  Lab 07/07/21 1632 07/07/21 2114 07/08/21 0803 07/08/21 0945 07/08/21 1137  GLUCAP 153* 115* 48* 114* 111*   Lipid Profile: No results for input(s): CHOL, HDL, LDLCALC, TRIG, CHOLHDL, LDLDIRECT in the last 72 hours. Thyroid Function Tests: No results for input(s): TSH, T4TOTAL, FREET4, T3FREE, THYROIDAB in the last 72 hours. Anemia Panel: No results for input(s): VITAMINB12, FOLATE, FERRITIN, TIBC, IRON, RETICCTPCT in the last 72 hours. Urine analysis:    Component Value Date/Time   COLORURINE YELLOW 04/29/2015 1702   APPEARANCEUR CLEAR 04/29/2015 1702   LABSPEC 1.016 04/29/2015 1702   PHURINE 7.5 04/29/2015 1702   GLUCOSEU >1000 (A) 04/29/2015 1702   HGBUR  SMALL (A) 04/29/2015 1702   BILIRUBINUR NEGATIVE 04/29/2015 1702   KETONESUR NEGATIVE 04/29/2015 1702   PROTEINUR >300 (A) 04/29/2015 1702   UROBILINOGEN 0.2 04/29/2015 1702   NITRITE NEGATIVE 04/29/2015 1702   LEUKOCYTESUR NEGATIVE 04/29/2015 1702   Sepsis Labs: '@LABRCNTIP'$ (procalcitonin:4,lacticacidven:4)  ) No results found for this or any previous visit (from the past 240 hour(s)).        Radiology Studies: No results found.      Scheduled Meds:  [MAR Hold] acetaminophen  1,000 mg Oral TID   [MAR Hold] allopurinol  100 mg Oral Daily   [MAR Hold] amiodarone  200 mg Oral Daily   [MAR Hold] aspirin EC  81 mg Oral Daily   [MAR Hold] atorvastatin  40 mg Oral Daily   [MAR Hold] Chlorhexidine Gluconate Cloth  6 each Topical Q0600   [MAR Hold] docusate sodium  100 mg Oral Daily   [MAR Hold] epoetin (EPOGEN/PROCRIT) injection  10,000 Units Intravenous Q M,W,F-HD   [MAR Hold] heparin injection (subcutaneous)  5,000 Units Subcutaneous Q8H   [MAR Hold] insulin aspart  0-5 Units Subcutaneous QHS   [MAR Hold] insulin aspart  0-6 Units Subcutaneous TID WC   [MAR Hold] levETIRAcetam  500 mg Oral BID   [MAR Hold] levETIRAcetam  500 mg Oral Once per day on Mon Wed Fri   [facility] Hold] mirtazapine  7.5 mg Oral QHS   [MAR Hold] senna-docusate  1 tablet Oral QHS   [MAR Hold] sertraline  25 mg Oral QHS   [MAR Hold] sevelamer carbonate  800 mg Oral TID WC   [MAR Hold] sodium chloride flush  3 mL Intravenous Q12H   Continuous Infusions:  [MAR Hold] sodium chloride     [MAR Hold] ceFEPime (MAXIPIME) IV 1 g (07/08/21 0943)   [MAR Hold] vancomycin Stopped (07/07/21 1654)     LOS: 11 days    Time spent: 25 minutes    Edwin Dada, MD [facility] 07/08/2021, 2:04 PM     Please page though [place] or Epic secure chat:  For [employer], Adult nurse

## 2021-07-08 NOTE — Transfer of Care (Signed)
Immediate Anesthesia Transfer of Care Note  Patient: Ernest [name]  Procedure(s) Performed: AMPUTATION ABOVE KNEE (Right: Knee)  Patient Location: PACU  Anesthesia Type:General  Level of Consciousness: awake and drowsy  Airway & Oxygen Therapy: Patient Spontanous Breathing and Patient connected to nasal cannula oxygen  Post-op Assessment: Report given to RN  Post vital signs: Reviewed and unstable    Last Vitals:  Vitals Value Taken Time  BP 83/50 07/08/21 1500  Temp    Pulse 80 07/08/21 1505  Resp 20 07/08/21 1505  SpO2 98 % 07/08/21 1505  Vitals shown include unvalidated device data.  Last Pain:  Vitals:   07/08/21 0920  TempSrc:   PainSc: 0-No pain         Complications: No notable events documented.

## 2021-07-08 NOTE — Consult Note (Signed)
NAME:  Ernest [name], MRN:  [id number], DOB:  1946/07/01, LOS: 11 ADMISSION DATE:  06/26/2021, CONSULTATION DATE: 07/08/2021 REFERRING MD: Karen Chafe CHIEF COMPLAINT: Postoperative hypotension  History of Present Illness:  [name] is a 75 year old patient with end-stage renal disease on hemodialysis Monday Wednesday Friday, multiple comorbidities as noted below, who presented on 26 June 2021 from his skilled nursing facility with the onset of fever with associated chills.  The patient was noted to have a right heel decubitus ulcer for which she had been getting oral antibiotics.  The wound has been getting progressively worse.  The patient developed wet gangrene and required above-the-knee amputation today.  Postoperatively the patient has been asymptomatic but somewhat lethargic and his blood pressures have been "soft" he is on Neo-Synephrine at 90 mcg/min.  He has received 500 mL of fluid bolus.  He did receive dialysis yesterday.  It is noted best his blood pressures have been in the [id number] systolics during this hospitalization as noted the patient is lethargic.  When arousable he does state that he feels he has some discomfort in his chest but cannot describe it as pain per se he does have any other complaint except postoperative pain.  We are asked to follow the patient due to his hypotension.  The patient will be in the stepdown unit.   Pertinent  Medical History  ESRD, dialysis MWF Grade 2 diastolic dysfunction Cerebrovascular disease with prior stroke Chronic dyspnea  Significant Hospital Events: Including procedures, antibiotic start and stop dates in addition to other pertinent events   9/24 right above-the-knee amputation due to gangrene  Interim History / Subjective:  Patient still lethargic postoperatively but does awaken.  No specific complaint does complain of pressure on the amputated limb.  Objective   Blood pressure (!) 89/52, pulse 78, temperature 97.9 F  (36.6 C), temperature source Oral, resp. rate 13, height '5\' 9"'$  (1.753 m), weight 81.4 kg, SpO2 99 %.    FiO2 (%):  [32 %] 32 %   Intake/Output Summary (Last 24 hours) at 07/08/2021 1746 Last data filed at 07/08/2021 1510 Gross per 24 hour  Intake 500 ml  Output 25 ml  Net 475 ml   Filed Weights   07/06/21 0505 07/07/21 0500 07/08/21 1614  Weight: 88.3 kg 88.2 kg 81.4 kg    Examination: General: Chronically ill-appearing man, lethargic but arousable postoperatively.  No respiratory distress. HEENT: Edentulous, oral mucosa moist.  Neck supple trachea midline. Lungs: Coarse breath sounds, no wheezes, no rhonchi. Cardiovascular: Regular rate and rhythm, no gallop, no murmur. Abdomen: Protuberant, soft, nondistended, nontender. Extremities: Status post right AKA, pressure bandage on, chronic stasis changes on left lower extremity.  Nonworking fistulae bilateral upper extremities. Neuro: Lethargic, arousable, dysarthria noted.   Resolved Hospital Problem list     Assessment & Plan:  Hypotension postoperatively Could be related to prolonged effects of anesthesia given ESRD Mild volume depletion, status postdialysis yesterday, n.p.o. status Redistribution of volume post AKA Gentle fluid challenge Wean phenylephrine off as tolerated Initiate midodrine Check cortisol level  Gangrene of the right lower extremity with associated sepsis Sepsis appears well controlled Continue antibiotics Status post AKA for source control  ESRD Dialysis per renal  We will continue to follow while patient is in the stepdown unit.  Anticipate resolution of hypotension relatively quickly.   Best Practice (right click and "Reselect all SmartList Selections" daily)   Diet/type: Renal diet DVT prophylaxis: prophylactic heparin  GI prophylaxis: N/A Lines: Dialysis Catheter Foley:  N/A  Code Status:  full code Last date of multidisciplinary goals of care discussion [N/A]  Labs   CBC: Recent  Labs  Lab 07/03/21 0527 07/05/21 1442 07/08/21 0540  WBC 17.0* 22.2* 14.1*  HGB 8.9* 9.4* 8.6*  HCT 27.1* 28.6* 26.2*  MCV 86.9 87.2 87.3  PLT 307 340 [id number]    Basic Metabolic Panel: Recent Labs  Lab 07/03/21 0527 07/05/21 1442 07/08/21 0540  NA 135 136 138  K 4.0 3.1* 3.4*  CL 99 96* 95*  CO2 '25 27 30  '$ GLUCOSE 237* 171* 64*  BUN 75* 25* 31*  CREATININE 8.60* 3.31* 4.39*  CALCIUM 8.7* 8.7* 9.2  MG 2.2  --  2.0  PHOS 7.0* 3.2 4.3   GFR: Estimated Creatinine Clearance: 15.2 mL/min (A) (by C-G formula based on SCr of 4.39 mg/dL (H)). Recent Labs  Lab 07/03/21 0527 07/05/21 1442 07/08/21 0540  WBC 17.0* 22.2* 14.1*    Liver Function Tests: Recent Labs  Lab 07/03/21 0527  ALBUMIN 2.3*   No results for input(s): LIPASE, AMYLASE in the last 168 hours. No results for input(s): AMMONIA in the last 168 hours.  ABG    Component Value Date/Time   PHART 7.427 09/15/2017 0320   PCO2ART 37.5 09/15/2017 0320   PO2ART 59.7 (L) 09/15/2017 0320   HCO3 32.8 (H) 06/26/2021 2321   TCO2 34 (H) 07/12/2020 0719   ACIDBASEDEF 4.0 (H) 09/14/2017 0153   O2SAT 39.7 06/26/2021 2321     Coagulation Profile: Recent Labs  Lab 07/08/21 0540  INR 1.3*    Cardiac Enzymes: No results for input(s): CKTOTAL, CKMB, CKMBINDEX, TROPONINI in the last 168 hours.  HbA1C: Hemoglobin A1C  Date/Time Value Ref Range Status  12/11/2017 12:00 AM 6.2  Final   Hgb A1c MFr Bld  Date/Time Value Ref Range Status  07/03/2021 05:27 AM 8.4 (H) 4.8 - 5.6 % Final    Comment:    (NOTE) Pre diabetes:          5.7%-6.4%  Diabetes:              >6.4%  Glycemic control for   <7.0% adults with diabetes   06/30/2021 07:30 AM 8.3 (H) 4.8 - 5.6 % Final    Comment:    (NOTE) Pre diabetes:          5.7%-6.4%  Diabetes:              >6.4%  Glycemic control for   <7.0% adults with diabetes     CBG: Recent Labs  Lab 07/08/21 0803 07/08/21 0945 07/08/21 1137 07/08/21 1456 07/08/21 1728   GLUCAP 48* 114* 111* 108* 141*    Review of Systems:   Patient unable to provide due to lethargy post op  Past Medical History:   Past Medical History:  Diagnosis Date   Abnormal stress test    s/p cath November 2013 with modest disease involving the ostial left main, proximal LAD, proximal RCA - do not appear to be hemodynamically signficant; mild LV dysfunction   Anemia    low iron   Anxiety    Arthritis    Bacteremia    Chronic systolic CHF (congestive heart failure) (HCC)    COPD (chronic obstructive pulmonary disease) ([place])    Coronary artery disease    a. per cath report 2013, b. 09/13/2017-CABG X4 & Mitral valve repair   Depression    DVT (deep venous thrombosis) (HCC)    arm - right- finished blood thinner- not sure when it  Ejection fraction < 50%    Erectile dysfunction    penile implant   ESRD (end stage renal disease) ([place])    [facility] on a MWF schedule.      Gout    Hx: of   Hepatitis C    History of seizures    last one 2018   Hyperlipidemia    Hypertension    Mitral valve disease    s/p Mitral repair 09/13/17   Obesity    Retinopathy    Shortness of breath    Hx: of with exertion   Stroke [facility])    "several" left side weakness, speech affected   Type II or unspecified type diabetes mellitus without mention of complication, not stated as uncontrolled    adult onset     Surgical History:   Past Surgical History:  Procedure Laterality Date   A/V FISTULAGRAM N/A 10/04/2017   Procedure: A/V FISTULAGRAM;  Surgeon: Elam Dutch, MD;  Location: [place] [facility];  Service: Cardiovascular;  Laterality: N/A;   A/V SHUNTOGRAM Left 07/12/2020   Procedure: A/V SHUNTOGRAM;  Surgeon: Serafina Mitchell, MD;  Location: [place] [facility];  Service: Cardiovascular;  Laterality: Left;   ANGIOPLASTY Left 09/24/2019   Procedure: Angioplasty Innominate Vien;  Surgeon: Serafina Mitchell, MD;  Location: [place];  Service: Vascular;  Laterality: Left;   AV FISTULA  PLACEMENT Left 01/13/2013   Procedure: INSERTION OF ARTERIOVENOUS (AV) GORE-TEX GRAFT ARM;  Surgeon: Angelia Mould, MD;  Location: [place];  Service: Vascular;  Laterality: Left;   AV FISTULA PLACEMENT Left 05/05/2013   Procedure: INSERTION OF LEFT UPPER ARM  ARTERIOVENOUS GORTEX GRAFT;  Surgeon: Angelia Mould, MD;  Location: [place];  Service: Vascular;  Laterality: Left;   AV FISTULA PLACEMENT Left 11/26/2019   Procedure: INSERTION OF ARTERIOVENOUS (AV) GORE-TEX GRAFT, left  ARM;  Surgeon: Serafina Mitchell, MD;  Location: [place];  Service: Vascular;  Laterality: Left;   [place] REMOVAL Left 03/26/2013   Procedure: REMOVAL OF  NON INCORPORATED ARTERIOVENOUS GORETEX GRAFT ([place]) left arm * repair of  left brachial artery with vein patch angioplasty.;  Surgeon: Angelia Mould, MD;  Location: [place];  Service: Vascular;  Laterality: Left;   CARDIAC CATHETERIZATION  August 26, 2012   CATARACT EXTRACTION W/ INTRAOCULAR LENS  IMPLANT, BILATERAL     COLONOSCOPY     CORONARY ARTERY BYPASS GRAFT N/A 09/13/2017   Procedure: CORONARY ARTERY BYPASS GRAFTING (CABG) times four LIMA  to LAD, SVG sequentially to OM and RAMUS Intermediate and SVG to Acute Marginal;  Surgeon: Melrose Nakayama, MD;  Location: [place];  Service: Open Heart Surgery;  Laterality: N/A;   DIALYSIS/PERMA CATHETER INSERTION N/A 02/28/2021   Procedure: DIALYSIS/PERMA CATHETER EXCHANGE;  Surgeon: Katha Cabal, MD;  Location: [place] [facility];  Service: Cardiovascular;  Laterality: N/A;   EMBOLECTOMY Right 08/27/2013   Procedure: EMBOLECTOMY;  Surgeon: Mal Misty, MD;  Location: [place];  Service: Vascular;  Laterality: Right;  Thrombectomy of Radial and ulnar artery.   ENDOVASCULAR STENT INSERTION Left 09/24/2019   Procedure: Endovascular Stent Graft Insertion, Arm Graft;  Surgeon: Serafina Mitchell, MD;  Location: [facility] OR;  Service: Vascular;  Laterality: Left;   EYE SURGERY     laser.  and surgery for DM   fistula      RUE and wrist   FISTULOGRAM Left 09/24/2019   Procedure: SHUNTOGRAM OF ARTERIOVENOUS FISTULA LEFT ARM,;  Surgeon: Serafina Mitchell,  MD;  Location: [place];  Service: Vascular;  Laterality: Left;   INSERTION OF DIALYSIS CATHETER Right 01/01/2013   Procedure: INSERTION OF DIALYSIS CATHETER right  internal jugular;  Surgeon: Serafina Mitchell, MD;  Location: [place];  Service: Vascular;  Laterality: Right;   INSERTION OF DIALYSIS CATHETER N/A 03/26/2013   Procedure: INSERTION OF DIALYSIS CATHETER Left internal jugular vein;  Surgeon: Angelia Mould, MD;  Location: [place];  Service: Vascular;  Laterality: N/A;   INSERTION OF DIALYSIS CATHETER Right 11/26/2019   Procedure: INSERTION OF DIALYSIS CATHETER, right internal jugular;  Surgeon: Serafina Mitchell, MD;  Location: [place];  Service: Vascular;  Laterality: Right;   IR THORACENTESIS ASP PLEURAL SPACE W/IMG GUIDE  09/30/2019   LEFT HEART CATH AND CORONARY ANGIOGRAPHY N/A 09/09/2017   Procedure: LEFT HEART CATH AND CORONARY ANGIOGRAPHY;  Surgeon: Troy Sine, MD;  Location: [place] [facility];  Service: Cardiovascular;  Laterality: N/A;   LIGATION OF ARTERIOVENOUS  FISTULA Right 12/30/2013   Procedure: REMOVAL OF SEGMENT OF GORTEX GRAFT AND FISTULA  AND REPAIR OF BRACHIAL ARTERY;  Surgeon: Mal Misty, MD;  Location: [place];  Service: Vascular;  Laterality: Right;   LOWER EXTREMITY ANGIOGRAPHY N/A 06/28/2021   Procedure: Lower Extremity Angiography;  Surgeon: Katha Cabal, MD;  Location: [place] [facility];  Service: Cardiovascular;  Laterality: N/A;   MITRAL VALVE REPAIR N/A 09/13/2017   Procedure: MITRAL VALVE REPAIR (MVR);  Surgeon: Melrose Nakayama, MD;  Location: [place];  Service: Open Heart Surgery;  Laterality: N/A;   MITRAL VALVE REPAIR (MV)/CORONARY ARTERY BYPASS GRAFTING (CABG)  09/13/2017   PENILE PROSTHESIS IMPLANT     1997.. no card   PERIPHERAL VASCULAR BALLOON ANGIOPLASTY Left 10/04/2017   Procedure: PERIPHERAL  VASCULAR BALLOON ANGIOPLASTY;  Surgeon: Elam Dutch, MD;  Location: [place] [facility];  Service: Cardiovascular;  Laterality: Left;  AVF   PERIPHERAL VASCULAR BALLOON ANGIOPLASTY Left 07/12/2020   Procedure: PERIPHERAL VASCULAR BALLOON ANGIOPLASTY;  Surgeon: Serafina Mitchell, MD;  Location: [place] [facility];  Service: Cardiovascular;  Laterality: Left;  ARM SHUNTOGRAM   REVISION OF ARTERIOVENOUS GORETEX GRAFT Left 09/24/2019   Procedure: REVISION OF ARTERIOVENOUS GORETEX GRAFT LEFT ARM;  Surgeon: Serafina Mitchell, MD;  Location: [facility];  Service: Vascular;  Laterality: Left;   TEE WITHOUT CARDIOVERSION N/A 06/11/2013   Procedure: TRANSESOPHAGEAL ECHOCARDIOGRAM (TEE);  Surgeon: Larey Dresser, MD;  Location: [facility] ENDOSCOPY;  Service: Cardiovascular;  Laterality: N/A;   TEE WITHOUT CARDIOVERSION N/A 09/13/2017   Procedure: TRANSESOPHAGEAL ECHOCARDIOGRAM (TEE);  Surgeon: Melrose Nakayama, MD;  Location: [place];  Service: Open Heart Surgery;  Laterality: N/A;   THROMBECTOMY W/ EMBOLECTOMY Left 03/26/2013   Procedure: Attempted thrombectomy of left arm arteriovenous goretex graft.;  Surgeon: Angelia Mould, MD;  [facility] OR;  Service: Vascular;  Laterality: Left;   ULTRASOUND GUIDANCE FOR VASCULAR ACCESS Bilateral 09/24/2019   Procedure: Ultrasound Guidance For Vascular Access, Right Femoral Vein and Left Arm Graft;  Surgeon: Serafina Mitchell, MD;  Location: [facility] OR;  Service: Vascular;  Laterality: Bilateral;   VENOGRAM N/A 04/07/2013   Procedure: VENOGRAM;  Surgeon: Serafina Mitchell, MD;  Location: [facility] CATH LAB;  Service: Cardiovascular;  Laterality: N/A;     Social History:   Social History   Tobacco Use   Smoking status: Former    Years: 7.00    Types: Cigarettes    Quit date: 06/10/1973    Years since quitting:  48.1   Smokeless tobacco: Never   Tobacco comments:    Quit in 1974.  Substance Use Topics   Alcohol use: No    Alcohol/week: 0.0 standard drinks     Family  History:   Family History  Problem Relation Age of Onset   Heart disease Father    Diabetes Sister    Diabetes Brother    Diabetes Son      Allergies [name]   Home Medications  Prior to Admission medications   Medication Sig Start Date End Date Taking? Authorizing Provider  allopurinol (ZYLOPRIM) 100 MG tablet Take 100 mg by mouth daily.   Yes [provider]  Amino Acids-Protein Hydrolys (FEEDING SUPPLEMENT, PRO-STAT 64,) LIQD Take 30 mLs by mouth 3 (three) times daily with meals.   Yes [provider]  amiodarone (PACERONE) 200 MG tablet Take 200 mg by mouth daily.   Yes [provider]  aspirin EC 81 MG tablet Take 1 tablet (81 mg total) by mouth daily. 11/29/15  Yes Rivet, Sindy Guadeloupe, MD  atorvastatin (LIPITOR) 40 MG tablet Take 40 mg by mouth at bedtime.   Yes [provider]  ciprofloxacin (CIPRO) 500 MG tablet Take 500 mg by mouth daily. 1 dose administered at [facility]   Yes [provider]  docusate sodium (COLACE) 100 MG capsule Take 100 mg by mouth daily.   Yes [provider]  doxycycline (DORYX) 100 MG EC tablet Take 100 mg by mouth 2 (two) times daily. No doses administered at SNF. Prescribed for 1 week duration   Yes [provider]  insulin detemir (LEVEMIR) 100 UNIT/ML injection Inject 0.2 mLs (20 Units total) into the skin 2 (two) times daily. Patient taking differently: Inject 23 Units into the skin at bedtime. 10/07/17  Yes Gold, Wayne E, [name]  levETIRAcetam (KEPPRA) 500 MG tablet Take 500 mg by mouth See admin instructions. Take 1 tablet ('500mg'$ ) by mouth twice daily every Tuesday, Thursday, Saturday and Sunday   Yes [provider]  levETIRAcetam (KEPPRA) 500 MG tablet Take 500 mg by mouth See admin instructions. Take 1 tablet ('500mg'$ ) by mouth three times daily on Monday, Wednesday and Friday   Yes [provider]  mirtazapine (REMERON) 7.5 MG tablet Take 7.5 mg by mouth at bedtime.     Yes [provider]  polyethylene glycol (MIRALAX / GLYCOLAX) 17 g packet Take 17 g by mouth daily.   Yes [provider]  senna-docusate (SENOKOT-S) 8.6-50 MG tablet Take 1 tablet by mouth at bedtime.   Yes [provider]  sertraline (ZOLOFT) 25 MG tablet Take 25 mg by mouth at bedtime.   Yes [provider]  sevelamer carbonate (RENVELA) 800 MG tablet Take 800 mg by mouth 3 (three) times daily with meals.   Yes [provider]  acetaminophen (TYLENOL) 500 MG tablet Take 1,000 mg by mouth every 6 (six) hours as needed for mild pain or moderate pain. Patient not taking: Reported on 06/27/2021    [provider]  linaclotide (LINZESS) 145 MCG CAPS capsule Take 145 mcg by mouth daily as needed (constipation).  Patient not taking: Reported on 06/27/2021    [provider]  Nutritional Supplements (FEEDING SUPPLEMENT, NEPRO CARB STEADY,) LIQD Take 237 mLs by mouth 2 (two) times daily between meals. Patient not taking: Reported on 06/27/2021 10/07/17   Jadene Pierini E, [name]     Scheduled Meds:  acetaminophen  1,000 mg Oral TID   allopurinol  100  mg Oral Daily   amiodarone  200 mg Oral Daily   aspirin EC  81 mg Oral Daily   atorvastatin  40 mg Oral Daily   Chlorhexidine Gluconate Cloth  6 each Topical Q0600   docusate sodium  100 mg Oral Daily   epoetin (EPOGEN/PROCRIT) injection  10,000 Units Intravenous Q M,W,F-HD   heparin injection (subcutaneous)  5,000 Units Subcutaneous Q8H   insulin aspart  0-5 Units Subcutaneous QHS   insulin aspart  0-6 Units Subcutaneous TID WC   levETIRAcetam  500 mg Oral BID   levETIRAcetam  500 mg Oral Once per day on Mon Wed Fri   midodrine  10 mg Oral TID AC   mirtazapine  7.5 mg Oral QHS   pantoprazole  40 mg Oral Daily   potassium chloride  20 mEq Oral Once   senna-docusate  1 tablet Oral QHS   sertraline  25 mg Oral QHS   sevelamer carbonate  800 mg Oral TID WC   sodium chloride flush  3 mL  Intravenous Q12H   Continuous Infusions:  sodium chloride     ceFEPime (MAXIPIME) IV 1 g (07/08/21 0943)   magnesium sulfate bolus IVPB     vancomycin Stopped (07/07/21 1654)   PRN Meds:.sodium chloride, alum & mag hydroxide-simeth, feeding supplement (NEPRO CARB STEADY), guaiFENesin-dextromethorphan, hydrALAZINE, labetalol, magnesium sulfate bolus IVPB, metoprolol tartrate, ondansetron **OR** ondansetron (ZOFRAN) IV, oxyCODONE, phenol, polyethylene glycol, potassium chloride, sodium chloride flush, traZODone   Critical care time: 70    Discussed with Dr. Feliberto Gottron.   Renold Don, MD Advanced Bronchoscopy PCCM [facility]    *This note was dictated using voice recognition software/Dragon.  Despite best efforts to proofread, errors can occur which can change the meaning.  Any change was purely unintentional.

## 2021-07-08 NOTE — Anesthesia Postprocedure Evaluation (Signed)
Anesthesia Post Note  Patient: Ernest [name]  Procedure(s) Performed: AMPUTATION ABOVE KNEE (Right: Knee)  Patient location during evaluation: PACU Anesthesia Type: General Level of consciousness: awake and responds to stimulation (at baseline) Pain management: pain level controlled Vital Signs Assessment: post-procedure vital signs reviewed and stable Respiratory status: spontaneous breathing Cardiovascular status: on Neo drip. Anesthetic complications: no Comments: To go to ICU due to Neo drip   No notable events documented.   Last Vitals:  Vitals:   07/08/21 1600 07/08/21 1605  BP: 111/68 (!) 110/25  Pulse: 73 73  Resp: 12 14  Temp:    SpO2: 99% 98%    Last Pain:  Vitals:   07/08/21 0920  TempSrc:   PainSc: 0-No pain                 Deno Etienne

## 2021-07-08 NOTE — Anesthesia Preprocedure Evaluation (Addendum)
Anesthesia Evaluation  Patient identified by MRN, date of birth, ID band Patient confused    Reviewed: Allergy & Precautions, NPO status , Patient's Chart, lab work & pertinent test results  Airway Mallampati: II  TM Distance: >3 FB     Dental  (+)    Pulmonary shortness of breath, sleep apnea , COPD, former smoker,    breath sounds clear to auscultation       Cardiovascular Exercise Tolerance: Poor hypertension, + CAD, + Past MI, + Peripheral Vascular Disease and +CHF   Rhythm:Regular Rate:Normal  Hyperlipidemia  06/27/21 ECHO: 1. Left ventricular ejection fraction, by estimation, is 50 to 55%. The left ventricle has low normal function. Left ventricular endocardial border not optimally defined to evaluate egional wall motion. There is moderate left ventricular hypertrophy. Left ventricular diastolic parameters are consistent with Grade II diastolic dysfunction (pseudonormalization). Elevated left atrial pressure. 2. Right ventricular systolic function is normal. The right ventricular size is normal. 3. Tricuspid regurgitation signal is inadequate for assessing PA pressure. 4. Left atrial size was mildly dilated. 5. Right atrial size was mildly dilated. The mitral valve is degenerative. Trivial mitral valve regurgitation. No evidence of mitral stenosis. Moderate to severe mitral annular calcification. 6. The aortic valve is tricuspid. There is mild calcification of the aortic valve. There is mild thickening of the aortic valve. Aortic valve regurgitation is not visualized. Mild to moderate aortic valve sclerosis/calcification is present, without any evidence of aortic stenosis. 7. Aortic dilatation noted. There is borderline dilatation of the aortic root, measuring 38 mm. 8. The inferior vena cava is normal in size with <50% respiratory variability, suggesting right atrial pressure of 8 mmHg.   Neuro/Psych Seizures -,  Anxiety  Depression Dementia TIA Neuromuscular disease CVA    GI/Hepatic negative GI ROS, (+) Hepatitis -, C  Endo/Other  diabetes  Renal/GU ESRFRenal disease     Musculoskeletal  (+) Arthritis , gout   Abdominal   Peds  Hematology  (+) anemia ,   Anesthesia Other Findings   Reproductive/Obstetrics                            Anesthesia Physical  Anesthesia Plan  ASA: 4  Anesthesia Plan: General   Post-op Pain Management:    Induction: Intravenous  PONV Risk Score and Plan: 2 and Ondansetron  Airway Management Planned: Oral ETT  Additional Equipment:   Intra-op Plan:   Post-operative Plan: Extubation in OR  Informed Consent: I have reviewed the patients History and Physical, chart, labs and discussed the procedure including the risks, benefits and alternatives for the proposed anesthesia with the patient or authorized representative who has indicated his/her understanding and acceptance.     Dental advisory given  Plan Discussed with: CRNA  Anesthesia Plan Comments:       Anesthesia Quick Evaluation

## 2021-07-08 NOTE — Progress Notes (Signed)
Hypoglycemic Event  CBG: 48  Treatment: D50 50 mL (25 gm)  Symptoms: None  Follow-up CBG: [id number] CBG Result:114  Possible Reasons for Event: Inadequate meal intake  Comments/MD notified:Will notify MD    Filomena Jungling

## 2021-07-08 NOTE — Progress Notes (Signed)
Patient arrived from PACU to room ICU 14 @ 1700;  Pt lethargic but responds to voice;  Dysarthria noted; Pt oriented to self and location;  Reoriented to situation;  Neo gtt infusing at 90 mcg/min; Titration for SBP >85 per order;  Bedside swallow test initiated; Patient passed; Diet ordered;   PIVs intact.  MD notified of admission;   PRN Oxycodone and midodrine given;   KRAY, RN, BSN

## 2021-07-08 NOTE — Procedures (Signed)
Procedures Preoperative diagnosis: Right lower extremity foot wet gangrene with sepsis. Postoperative diagnosis same Procedure: Right lower extremity above-the-knee amputation Indications for procedure the patient is a 75 year old male patient with nonhealing heel ulceration with wet gangrene and sepsis.  The risks of surgical intervention were discussed with the patient and his daughter, who elected to undergo surgical intervention.  Description of procedure The timeouts were performed using both preinduction and preincision safety checklist to verify correct patient, procedure, site, and additional critical information prior to commencing the procedure. The procedure was performed under general endotracheal anesthesia.  The patient was placed in supine position.  The patient's right lower extremity was prepped and draped in the standard surgical fashion.  An occlusive dressing was applied to the leg tip to the level of the knee.  Anterior and posterior skin flaps were outlined with a marking pen 5 to 10 cm proximal to the knee joint.  The skin incision was then performed and deepened through the subcutaneous tissues until the muscular fascia was identified.  The dissection was performed with the harmonic scalpel down to the fascia.  The greater saphenous vein was identified, and transected with the harmonic scalpel and min settings.  Good hemostasis was obtained.  The muscle groups over the anterior medial thigh were divided with the harmonic scalpel at the same level of the skin incision.  Neurovascular bundle was identified on the medial aspect of the thigh.  The popliteal artery and veins were isolated and suture-ligated with a 2-0 silk suture.  The sciatic nerve was pulled, ligated with a 2-0 silk tie, and divided.  The posterior thigh muscles were then divided with the harmonic scalpel.  Once the muscle groups were circumferentially divided, the periosteum was incised and elevated approximately 5  cm proximally off the femur.  The femur was divided with a electric saw.  The proximal end of the transected femur was smoothed with a file.  The amputation stump was irrigated copiously with saline solution.  Periosteum was closed with a running 2-0 Vicryl suture over the transected femur.  The fascia of the muscles were closed with interrupted 0 Vicryl sutures.  The skin was approximated using staples.  Xeroform gauze, 4 x 4 fluffs, 4 x 4's, ABDs, Kerlix, and Ace bandage was applied.  Charlie Pitter was applied over the dressing.  A briefing checklist was completed to share information critical to postoperative care of the patient.  The patient tolerated the procedure well and was taken to the postanesthetic care unit in satisfactory condition.  Complications: None Estimated blood loss: Minimal Specimen: Right lower limb with knee.

## 2021-07-08 NOTE — Procedures (Signed)
Procedural/ Surgical Case Request: AMPUTATION ABOVE KNEE  Date/Time: 07/08/2021 3:08 PM Performed by: Elmore Guise, MD Authorized by: Elmore Guise, MD  Consent: Verbal consent obtained. Written consent obtained. Risks and benefits: risks, benefits and alternatives were discussed Consent given by: power of attorney and patient Patient understanding: patient states understanding of the procedure being performed Patient consent: the patient's understanding of the procedure matches consent given Procedure consent: procedure consent matches procedure scheduled Relevant documents: relevant documents present and verified Test results: test results available and properly labeled Site marked: the operative site was marked Imaging studies: imaging studies available Required items: required blood products, implants, devices, and special equipment available Patient identity confirmed: verbally with patient and arm band Time out: Immediately prior to procedure a "time out" was called to verify the correct patient, procedure, equipment, support staff and site/side marked as required. Preparation: Patient was prepped and draped in the usual sterile fashion. Local anesthesia used: no  Anesthesia: Local anesthesia used: no  Sedation: Patient sedated: no  Patient tolerance: patient tolerated the procedure well with no immediate complications

## 2021-07-08 NOTE — Plan of Care (Signed)
Discussed with patient plan of care for the evening, pain management and medications for the evening with some teach back displayed.  Problem: Education: Goal: Knowledge of General Education information will improve Description: Including pain rating scale, medication(s)/side effects and non-pharmacologic comfort measures Outcome: Progressing

## 2021-07-08 NOTE — Progress Notes (Signed)
10 Days Post-Op   Subjective/Chief Complaint: Patient seems to be more acceptable to having his amputation.  He understands all the risks and benefits of the surgery.  We have spoken to the daughter who also consents for surgery.    Objective: Vital signs in last 24 hours: Temp:  [97.5 F (36.4 C)-98.5 F (36.9 C)] 98.2 F (36.8 C) (09/24 0806) Pulse Rate:  [57-73] 57 (09/24 0806) Resp:  [12-31] 12 (09/24 0806) BP: (87-132)/(32-70) 115/48 (09/24 0806) SpO2:  [98 %-100 %] 99 % (09/24 0806) Last BM Date: 07/04/21  Intake/Output from previous day: 09/23 0701 - 09/24 0700 In: -  Out: -400  Intake/Output this shift: No intake/output data recorded.  General appearance: alert, cooperative, appears older than stated age, moderate distress, and toxic Head: Normocephalic, without obvious abnormality, atraumatic Neck: no adenopathy, no carotid bruit, no JVD, supple, symmetrical, trachea midline, and thyroid not enlarged, symmetric, no tenderness/mass/nodules Resp: clear to auscultation bilaterally Cardio: regular rate and rhythm, S1, S2 normal, no murmur, click, rub or gallop Incision/Wound: Right heal wound with wet gangrene.    Lab Results:  Recent Labs    07/05/21 1442 07/08/21 0540  WBC 22.2* 14.1*  HGB 9.4* 8.6*  HCT 28.6* 26.2*  PLT 340 232   BMET Recent Labs    07/05/21 1442 07/08/21 0540  NA 136 138  K 3.1* 3.4*  CL 96* 95*  CO2 27 30  GLUCOSE 171* 64*  BUN 25* 31*  CREATININE 3.31* 4.39*  CALCIUM 8.7* 9.2   PT/INR Recent Labs    07/08/21 0540  LABPROT 16.5*  INR 1.3*   ABG No results for input(s): PHART, HCO3 in the last 72 hours.  Invalid input(s): PCO2, PO2  Studies/Results: No results found.  Anti-infectives: Anti-infectives (From admission, onward)    Start     Dose/Rate Route Frequency Ordered Stop   07/01/21 1000  ceFEPIme (MAXIPIME) 1 g in sodium chloride 0.9 % 100 mL IVPB        1 g 200 mL/hr over 30 Minutes Intravenous Every 24  hours 06/30/21 1254     06/30/21 1600  vancomycin (VANCOCIN) IVPB 1000 mg/200 mL premix        1,000 mg 200 mL/hr over 60 Minutes Intravenous Every M-W-F (Hemodialysis) 06/30/21 1254     06/29/21 0800  ceFAZolin (ANCEF) IVPB 1 g/50 mL premix  Status:  Discontinued       Note to Pharmacy: To be given in specials   1 g 100 mL/hr over 30 Minutes Intravenous  Once 06/28/21 1441 06/28/21 1651   06/28/21 1513  ceFAZolin (ANCEF) 1-4 GM/50ML-% IVPB       Note to Pharmacy: Corlis Hove   : cabinet override      06/28/21 1513 06/29/21 0314   06/28/21 1200  vancomycin (VANCOCIN) IVPB 1000 mg/200 mL premix  Status:  Discontinued        1,000 mg 200 mL/hr over 60 Minutes Intravenous Every M-W-F (Hemodialysis) 06/27/21 0959 06/30/21 1254   06/27/21 1100  metroNIDAZOLE (FLAGYL) IVPB 500 mg        500 mg 100 mL/hr over 60 Minutes Intravenous Every 12 hours 06/27/21 0250 07/04/21 1059   06/27/21 1100  vancomycin (VANCOREADY) IVPB 750 mg/150 mL        750 mg 150 mL/hr over 60 Minutes Intravenous  Once 06/27/21 1000 06/27/21 1137   06/27/21 1000  ceFEPIme (MAXIPIME) 1 g in sodium chloride 0.9 % 100 mL IVPB  Status:  Discontinued  1 g 200 mL/hr over 30 Minutes Intravenous  Once 06/27/21 0250 06/27/21 0316   06/27/21 1000  ceFEPIme (MAXIPIME) 1 g in sodium chloride 0.9 % 100 mL IVPB  Status:  Discontinued        1 g 200 mL/hr over 30 Minutes Intravenous Every 24 hours 06/27/21 0316 06/30/21 1254   06/27/21 0336  vancomycin (VANCOCIN) IVPB 1000 mg/200 mL premix  Status:  Discontinued        1,000 mg 200 mL/hr over 60 Minutes Intravenous Every Dialysis 06/27/21 0337 06/27/21 1258   06/27/21 0330  vancomycin (VANCOREADY) IVPB 750 mg/150 mL  Status:  Discontinued        750 mg 150 mL/hr over 60 Minutes Intravenous  Once 06/27/21 0258 06/27/21 0959   06/27/21 0300  vancomycin (VANCOCIN) IVPB 1000 mg/200 mL premix  Status:  Discontinued        1,000 mg 200 mL/hr over 60 Minutes Intravenous  Once  06/27/21 0250 06/27/21 0258   06/27/21 0115  piperacillin-tazobactam (ZOSYN) IVPB 3.375 g        3.375 g 100 mL/hr over 30 Minutes Intravenous  Once 06/27/21 0112 06/27/21 0255   06/26/21 2330  cefTRIAXone (ROCEPHIN) 1 g in sodium chloride 0.9 % 100 mL IVPB  Status:  Discontinued        1 g 200 mL/hr over 30 Minutes Intravenous  Once 06/26/21 2321 06/27/21 0111   06/26/21 2330  vancomycin (VANCOCIN) IVPB 1000 mg/200 mL premix        1,000 mg 200 mL/hr over 60 Minutes Intravenous  Once 06/26/21 2321 06/27/21 0210       Assessment/Plan: s/p Procedure(s): Lower Extremity Angiography (N/A) Patient is bedridden and is not a good candidate for BKA due to his current state and best option for long term results is AKA.  I have discussed this with the patient and was conveyed to the daughter by our vascular team.  He will have surgery today.  LOS: 11 days    Elmore Guise 07/08/2021

## 2021-07-09 ENCOUNTER — Encounter: Payer: Self-pay | Admitting: Surgery

## 2021-07-09 DIAGNOSIS — N186 End stage renal disease: Secondary | ICD-10-CM | POA: Diagnosis not present

## 2021-07-09 DIAGNOSIS — M869 Osteomyelitis, unspecified: Secondary | ICD-10-CM | POA: Diagnosis not present

## 2021-07-09 DIAGNOSIS — A419 Sepsis, unspecified organism: Secondary | ICD-10-CM | POA: Diagnosis not present

## 2021-07-09 DIAGNOSIS — I96 Gangrene, not elsewhere classified: Secondary | ICD-10-CM | POA: Diagnosis not present

## 2021-07-09 LAB — BPAM RBC
Blood Product Expiration Date: 202210262359
Blood Product Expiration Date: 202210262359
Unit Type and Rh: 5100
Unit Type and Rh: 5100

## 2021-07-09 LAB — CBC
HCT: 29.2 % — ABNORMAL LOW (ref 39.0–52.0)
Hemoglobin: 9 g/dL — ABNORMAL LOW (ref 13.0–17.0)
MCH: 27.3 pg (ref 26.0–34.0)
MCHC: 30.8 g/dL (ref 30.0–36.0)
MCV: 88.5 fL (ref 80.0–100.0)
Platelets: 285 10*3/uL (ref 150–400)
RBC: 3.3 MIL/uL — ABNORMAL LOW (ref 4.22–5.81)
RDW: 17 % — ABNORMAL HIGH (ref 11.5–15.5)
WBC: 23 10*3/uL — ABNORMAL HIGH (ref 4.0–10.5)
nRBC: 0 % (ref 0.0–0.2)

## 2021-07-09 LAB — TYPE AND SCREEN
ABO/RH(D): B POS
Antibody Screen: NEGATIVE
Unit division: 0
Unit division: 0

## 2021-07-09 LAB — BASIC METABOLIC PANEL
Anion gap: 21 — ABNORMAL HIGH (ref 5–15)
BUN: 47 mg/dL — ABNORMAL HIGH (ref 8–23)
CO2: 20 mmol/L — ABNORMAL LOW (ref 22–32)
Calcium: 9.2 mg/dL (ref 8.9–10.3)
Chloride: 94 mmol/L — ABNORMAL LOW (ref 98–111)
Creatinine, Ser: 6.03 mg/dL — ABNORMAL HIGH (ref 0.61–1.24)
GFR, Estimated: 9 mL/min — ABNORMAL LOW (ref >60)
Glucose, Bld: 246 mg/dL — ABNORMAL HIGH (ref 70–99)
Potassium: 4.8 mmol/L (ref 3.5–5.1)
Sodium: 135 mmol/L (ref 135–145)

## 2021-07-09 LAB — CORTISOL: Cortisol, Plasma: 20.6 ug/dL

## 2021-07-09 LAB — PHOSPHORUS: Phosphorus: 6.8 mg/dL — ABNORMAL HIGH (ref 2.5–4.6)

## 2021-07-09 LAB — MAGNESIUM: Magnesium: 1.9 mg/dL (ref 1.7–2.4)

## 2021-07-09 LAB — GLUCOSE, CAPILLARY
Glucose-Capillary: 231 mg/dL — ABNORMAL HIGH (ref 70–99)
Glucose-Capillary: 202 mg/dL — ABNORMAL HIGH (ref 70–99)
Glucose-Capillary: 230 mg/dL — ABNORMAL HIGH (ref 70–99)
Glucose-Capillary: 205 mg/dL — ABNORMAL HIGH (ref 70–99)

## 2021-07-09 LAB — PREPARE RBC (CROSSMATCH)

## 2021-07-09 MED ORDER — INSULIN ASPART 100 UNIT/ML IJ SOLN
0.0000 [IU] | Freq: Three times a day (TID) | INTRAMUSCULAR | Status: DC
  Administered 2021-07-09 – 2021-07-10 (×2): 3 [IU] via SUBCUTANEOUS
  Administered 2021-07-10 – 2021-07-11 (×3): 2 [IU] via SUBCUTANEOUS
  Administered 2021-07-11 (×2): 1 [IU] via SUBCUTANEOUS
  Administered 2021-07-12: 2 [IU] via SUBCUTANEOUS
  Administered 2021-07-13 (×2): 1 [IU] via SUBCUTANEOUS
  Filled 2021-07-09 (×11): qty 1

## 2021-07-09 MED ORDER — KETOROLAC TROMETHAMINE 15 MG/ML IJ SOLN
15.0000 mg | Freq: Three times a day (TID) | INTRAMUSCULAR | Status: DC | PRN
  Administered 2021-07-09 – 2021-07-12 (×2): 15 mg via INTRAVENOUS
  Filled 2021-07-09 (×2): qty 1

## 2021-07-09 NOTE — Progress Notes (Signed)
OT Cancellation Note  Patient Details Name: Ernest [name] MRN: [id number] DOB: 06/21/1946   Cancelled Treatment:    Reason Eval/Treat Not Completed: Medical issues which prohibited therapy. OT order received and chart reviewed. Pt noted to have most recent BPs outside of safe parameters for therapy: 92/37 (MAP 55), 85/59 (MAP 68), 74/45 (MAP 55), and 85/31(MAP 49). Will follow remotely and re-evaluate when medically appropriate.    Fredirick Maudlin, OTR/L [place]

## 2021-07-09 NOTE — Progress Notes (Signed)
Patients MAP below 50. Patients mentation has not changed. Orders to keep neo off. Continue to assess.

## 2021-07-09 NOTE — Progress Notes (Signed)
Per Dr. Feliberto Gottron no dressing change. Per patient feels tight. MD and nurse in room. Dressing not constricting and MD placed orders for pain medication. Continue to assess.

## 2021-07-09 NOTE — Progress Notes (Signed)
[facility]  PROGRESS NOTE   Subjective:     Objective:  Vital signs in last 24 hours:  Temp:  [97.7 F (36.5 C)-98.5 F (36.9 C)] 98.1 F (36.7 C) (09/25 1100) Pulse Rate:  [65-91] 76 (09/25 1215) Resp:  [10-22] 11 (09/25 1215) BP: (57-177)/(16-130) 85/31 (09/25 1215) SpO2:  [89 %-100 %] 100 % (09/25 1215) FiO2 (%):  [32 %] 32 % (09/24 1511) Weight:  [81.4 kg-82.7 kg] 82.7 kg (09/25 0326)  Weight change:  Filed Weights   07/07/21 0500 07/08/21 1614 07/09/21 0326  Weight: 88.2 kg 81.4 kg 82.7 kg    Intake/Output: I/O last 3 completed shifts: In: 3184.1 [P.O.:120; I.V.:1299; IV Piggyback:1765.1] Out: 25 [Blood:25]   Intake/Output this shift:  No intake/output data recorded.  Physical Exam: General:  No acute distress  Head:  Normocephalic, atraumatic. Moist oral mucosal membranes  Eyes:  Anicteric  Neck:  Supple  Lungs:   Clear to auscultation, normal effort  Heart:  S1S2 no rubs  Abdomen:   Soft, nontender, bowel sounds present  Extremities:  peripheral edema.  Neurologic:  Awake, alert, following commands  Skin:  No lesions  Access:     Basic Metabolic Panel: Recent Labs  Lab 07/03/21 0527 07/05/21 1442 07/08/21 0540 07/08/21 1813 07/09/21 0630  NA 135 136 138  --  135  K 4.0 3.1* 3.4*  --  4.8  CL 99 96* 95*  --  94*  CO2 '25 27 30  '$ --  20*  GLUCOSE 237* 171* 64*  --  246*  BUN 75* 25* 31*  --  47*  CREATININE 8.60* 3.31* 4.39* 5.13* 6.03*  CALCIUM 8.7* 8.7* 9.2  --  9.2  MG 2.2  --  2.0  --  1.9  PHOS 7.0* 3.2 4.3  --  6.8*    CBC: Recent Labs  Lab 07/03/21 0527 07/05/21 1442 07/08/21 0540 07/08/21 1813 07/09/21 0630  WBC 17.0* 22.2* 14.1* 22.7* 23.0*  HGB 8.9* 9.4* 8.6* 9.6* 9.0*  HCT 27.1* 28.6* 26.2* 29.9* 29.2*  MCV 86.9 87.2 87.3 87.4 88.5  PLT 307 340 232 335 285     Urinalysis: No results for input(s): COLORURINE, LABSPEC, PHURINE, GLUCOSEU, HGBUR, BILIRUBINUR, KETONESUR, PROTEINUR, UROBILINOGEN, NITRITE,  LEUKOCYTESUR in the last 72 hours.  Invalid input(s): APPERANCEUR    Imaging: No results found.   Medications:    magnesium sulfate bolus IVPB     phenylephrine (NEO-SYNEPHRINE) Adult infusion 20 mcg/min (07/09/21 0400)    acetaminophen  1,000 mg Oral TID   allopurinol  100 mg Oral Daily   amiodarone  200 mg Oral Daily   aspirin EC  81 mg Oral Daily   atorvastatin  40 mg Oral Daily   Chlorhexidine Gluconate Cloth  6 each Topical Q0600   docusate sodium  100 mg Oral Daily   epoetin (EPOGEN/PROCRIT) injection  10,000 Units Intravenous Q M,W,F-HD   heparin injection (subcutaneous)  5,000 Units Subcutaneous Q8H   insulin aspart  0-5 Units Subcutaneous QHS   insulin aspart  0-6 Units Subcutaneous TID WC   levETIRAcetam  500 mg Oral BID   levETIRAcetam  500 mg Oral Once per day on Mon Wed Fri   midodrine  10 mg Oral TID AC   mirtazapine  7.5 mg Oral QHS   pantoprazole  40 mg Oral Daily   potassium chloride  20 mEq Oral Once   senna-docusate  1 tablet Oral QHS   sertraline  25 mg Oral QHS   sevelamer carbonate  800 mg Oral TID WC    Assessment/ Plan:     Active Problems:   Sepsis ([place])   PAD (peripheral artery disease) ([place])  75 year old male with history of hypertension, coronary artery disease, COPD, congestive heart failure, hepatitis C, hyperlipidemia, ESRD on hemodialysis Monday Wednesday Friday.  He is now being admitted with history of acute respiratory failure and also sepsis.   #1: ESRD: Patient had stable dialysis Friday. Next dialysis is scheduled for Monday.   #2: Right lower extremity osteomyelitis: S/p right lower extremity angiogram.  S/p right amputation  #3: Anemia: Continue Epogen and anemia protocols.   #4: Secondary hyperparathyroidism: We will continue to monitor and continue the sevelamer.   #5: Hypertension: Blood pressure is well controlled with present antihypertensive regimen.   Will continue to follow closely and give recommendations as  required.   LOS: Joaquin, [facility] 9/25/20221:58 PM

## 2021-07-09 NOTE — Evaluation (Signed)
Physical Therapy Re-Evaluation Patient Details Name: OZAN MASTRO MRN: [id number] DOB: 1946-01-26 Today's Date: 07/09/2021  History of Present Illness  Pt is a 75 y/o M admitted on 06/26/21 from SNF long term care with c/o fever & chills. Pt found to have R heel decubitus ulcer & developed wet gangrene. Pt underwent R AKA on 07/08/21, prompting PT re-evaluation. PMH: ESRD On HD MWF, chronic systolic CHF, COPD, CAD, DVT, GERD, gout, Hep C, HTN, dyslipidemia, CVA  Clinical Impression  Pt seen for PT re-evaluation following R AKA. Pt is unable to complete bed mobility with total assist +1 & requires MAX multimodal cuing to attempt LLE strengthening exercises. Pt appears HOH & with impaired cognition. Pt nodding off towards end of session. Pt would benefit from STR upon d/c to maximize independence with functional mobility & reduce fall risk.        Recommendations for follow up therapy are one component of a multi-disciplinary discharge planning process, led by the attending physician.  Recommendations may be updated based on patient status, additional functional criteria and insurance authorization.  Follow Up Recommendations SNF;Supervision/Assistance - 24 hour    Equipment Recommendations  None recommended by PT    Recommendations for Other Services       Precautions / Restrictions Precautions Precautions: Fall Restrictions Weight Bearing Restrictions: Yes RLE Weight Bearing: Non weight bearing      Mobility  Bed Mobility Overal bed mobility: Needs Assistance Bed Mobility: Rolling;Supine to Sit Rolling: Total assist   Supine to sit: Total assist     General bed mobility comments: total assist to initiate rolling/supine>sit with pt unable to complete with total assist +1 with HOB elevated & bed rails, limited ability to follow commands to utilize bed rails    Transfers                    Ambulation/Gait                Stairs            Wheelchair  Mobility    Modified Rankin (Stroke Patients Only)       Balance       Sitting balance - Comments: unable to sit EOB today                                     Pertinent Vitals/Pain Pain Assessment: Faces Faces Pain Scale: Hurts a little bit Pain Location: RLE Pain Descriptors / Indicators: Discomfort Pain Intervention(s): Monitored during session    Home Living Family/patient expects to be discharged to:: Skilled nursing facility                      Prior Function Level of Independence: Needs assistance   Gait / Transfers Assistance Needed: Pt reports he scoots onto electric scooter at SNF. He says he has not stood or taken steps in several weeks. Per chart review, 7 months ago, he was walking short distances with RW.  ADL's / Homemaking Assistance Needed: Required assist for bathing, dressing, and IADLs.  Comments: all information obtained from chart     Hand Dominance        Extremity/Trunk Assessment   Upper Extremity Assessment Upper Extremity Assessment: Generalized weakness (drops utensils during session, unable to elevate BUE to feed himself)    Lower Extremity Assessment Lower Extremity Assessment: Generalized weakness;Difficult to assess due to impaired cognition (  not able to formally test 2/2 cognitive/hearing deficits, pt unable to perform RLE AROM, requires multimodal cuing to complete LLE AAROM exercises) LLE Deficits / Details: darkened spots/wound areas on LLE       Communication   Communication: HOH  Cognition Arousal/Alertness: Lethargic Behavior During Therapy: Flat affect Overall Cognitive Status: No family/caregiver present to determine baseline cognitive functioning                                 General Comments: difficult to understand speech, inconsistently follows simple commands with extra time/cuing, nods off towards end of session, requires max multimodal cuing but poor ability to complete  functional tasks despite cuing/education      General Comments      Exercises General Exercises - Lower Extremity Heel Slides: AAROM;Strengthening;Left;10 reps;Supine Hip ABduction/ADduction: AAROM;Strengthening;Left;10 reps;Supine Straight Leg Raises: AAROM;Left;Strengthening;10 reps;Supine   Assessment/Plan    PT Assessment Patient needs continued PT services  PT Problem List Decreased strength;Decreased activity tolerance;Decreased knowledge of use of DME;Decreased balance;Decreased safety awareness;Decreased mobility;Decreased knowledge of precautions;Decreased skin integrity;Cardiopulmonary status limiting activity;Decreased cognition       PT Treatment Interventions DME instruction;Balance training;Gait training;Neuromuscular re-education;Stair training;Cognitive remediation;Functional mobility training;Patient/family education;Therapeutic activities;Therapeutic exercise;Wheelchair mobility training;Modalities;Manual techniques    PT Goals (Current goals can be found in the Care Plan section)  Acute Rehab PT Goals PT Goal Formulation: Patient unable to participate in goal setting Time For Goal Achievement: 07/23/21 Potential to Achieve Goals: Fair    Frequency Min 2X/week   Barriers to discharge Decreased caregiver support      Co-evaluation               AM-PAC PT "6 Clicks" Mobility  Outcome Measure Help needed turning from your back to your side while in a flat bed without using bedrails?: Total Help needed moving from lying on your back to sitting on the side of a flat bed without using bedrails?: Total Help needed moving to and from a bed to a chair (including a wheelchair)?: Total Help needed standing up from a chair using your arms (e.g., wheelchair or bedside chair)?: Total Help needed to walk in hospital room?: Total Help needed climbing 3-5 steps with a railing? : Total 6 Click Score: 6    End of Session Equipment Utilized During Treatment:  Oxygen Activity Tolerance: Patient tolerated treatment well Patient left: in bed;with bed alarm set;with call bell/phone within reach;with SCD's reapplied (SCD on LLE) Nurse Communication: Mobility status PT Visit Diagnosis: Muscle weakness (generalized) (M62.81);Difficulty in walking, not elsewhere classified (R26.2);Other abnormalities of gait and mobility (R26.89);Unsteadiness on feet (R26.81)    Time: [id number] PT Time Calculation (min) (ACUTE ONLY): 10 min   Charges:   PT Evaluation $PT Re-evaluation: 1 Re-eval          Lavone Nian, PT, DPT 07/09/21, 11:11 AM   Waunita Schooner 07/09/2021, 11:10 AM

## 2021-07-09 NOTE — Progress Notes (Signed)
NAME:  Ernest [name], MRN:  [id number], DOB:  16-Jul-1946, LOS: 12 ADMISSION DATE:  06/26/2021, CONSULTATION DATE: 07/08/2021 REFERRING MD: Karen Chafe CHIEF COMPLAINT: Postoperative hypotension  History of Present Illness:  Ernest [name] is a 75 year old patient with end-stage renal disease on hemodialysis Monday Wednesday Friday, multiple comorbidities as noted below, who presented on 26 June 2021 from his skilled nursing facility with the onset of fever with associated chills.  The patient was noted to have a right heel decubitus ulcer for which she had been getting oral antibiotics.  The wound has been getting progressively worse.  The patient developed wet gangrene and required above-the-knee amputation today.  Postoperatively the patient has been asymptomatic but somewhat lethargic and his blood pressures have been "soft" he is on Neo-Synephrine at 90 mcg/min.  He has received 500 mL of fluid bolus.  He did receive dialysis 9/23.  It is noted best his blood pressures have been in the [id number] systolics during this hospitalization as noted the patient is lethargic.  When arousable he does state that he feels he has some discomfort in his chest but cannot describe it as pain per se he does have any other complaint except postoperative pain.  We are asked to follow the patient due to his hypotension.  The patient will be in the stepdown unit.   Pertinent  Medical History  ESRD, dialysis MWF Grade 2 diastolic dysfunction Cerebrovascular disease with prior stroke Chronic dyspnea Diastolic blood pressures chronically in the 39s  Significant Hospital Events: Including procedures, antibiotic start and stop dates in addition to other pertinent events   9/24 right above-the-knee amputation due to gangrene  Interim History / Subjective:  Interactive today, eating well.  Mentating well.  Off of Neo-Synephrine. No specific complaint does complain of pressure on the amputated limb.  Objective    Blood pressure (!) 92/42, pulse 68, temperature 98.1 F (36.7 C), resp. rate 10, height '5\' 9"'$  (1.753 m), weight 82.7 kg, SpO2 100 %.        Intake/Output Summary (Last 24 hours) at 07/09/2021 1642 Last data filed at 07/09/2021 0400 Gross per 24 hour  Intake 2684.09 ml  Output --  Net 2684.09 ml    Filed Weights   07/07/21 0500 07/08/21 1614 07/09/21 0326  Weight: 88.2 kg 81.4 kg 82.7 kg    Examination: General: Chronically ill-appearing man, awake, alert interactive.  No respiratory distress. HEENT: Edentulous, oral mucosa moist.  Neck supple trachea midline. Lungs: Coarse breath sounds, no wheezes, no rhonchi. Cardiovascular: Regular rate and rhythm, no gallop, no murmur. Abdomen: Protuberant, soft, nondistended, nontender. Extremities: Status post right AKA, pressure bandage on, chronic stasis changes on left lower extremity.  Nonworking fistulae bilateral upper extremities. Neuro: Awake and alert, dysarthria noted (residual from prior stroke).   Resolved Hospital Problem list     Assessment & Plan:  Hypotension postoperatively Appears to be at baseline Off of Neo-Synephrine Redistribution of volume post AKA Continue midodrine Cortisol level normal at 20.6  Gangrene of the right lower extremity with associated sepsis Sepsis appears well controlled Status post AKA for source control Antibiotics discontinued by vascular  ESRD Dialysis per renal  PCCM will sign off.  Patient was being followed by [facility] and vascular request that [place] continue to follow   Best Practice (right click and "Reselect all SmartList Selections" daily)   Diet/type: Renal diet DVT prophylaxis: prophylactic heparin  GI prophylaxis: N/A Lines: Dialysis Catheter Foley:  N/A Code Status:  full code Last date of  multidisciplinary goals of care discussion [N/A]  Labs   CBC: Recent Labs  Lab 07/03/21 0527 07/05/21 1442 07/08/21 0540 07/08/21 1813 07/09/21 0630  WBC 17.0* 22.2* 14.1*  22.7* 23.0*  HGB 8.9* 9.4* 8.6* 9.6* 9.0*  HCT 27.1* 28.6* 26.2* 29.9* 29.2*  MCV 86.9 87.2 87.3 87.4 88.5  PLT 307 340 232 335 285     Basic Metabolic Panel: Recent Labs  Lab 07/03/21 0527 07/05/21 1442 07/08/21 0540 07/08/21 1813 07/09/21 0630  NA 135 136 138  --  135  K 4.0 3.1* 3.4*  --  4.8  CL 99 96* 95*  --  94*  CO2 '25 27 30  '$ --  20*  GLUCOSE 237* 171* 64*  --  246*  BUN 75* 25* 31*  --  47*  CREATININE 8.60* 3.31* 4.39* 5.13* 6.03*  CALCIUM 8.7* 8.7* 9.2  --  9.2  MG 2.2  --  2.0  --  1.9  PHOS 7.0* 3.2 4.3  --  6.8*    GFR: Estimated Creatinine Clearance: 11.1 mL/min (A) (by C-G formula based on SCr of 6.03 mg/dL (H)). Recent Labs  Lab 07/05/21 1442 07/08/21 0540 07/08/21 1813 07/09/21 0630  WBC 22.2* 14.1* 22.7* 23.0*     Liver Function Tests: Recent Labs  Lab 07/03/21 0527  ALBUMIN 2.3*    No results for input(s): LIPASE, AMYLASE in the last 168 hours. No results for input(s): AMMONIA in the last 168 hours.  ABG    Component Value Date/Time   PHART 7.427 09/15/2017 0320   PCO2ART 37.5 09/15/2017 0320   PO2ART 59.7 (L) 09/15/2017 0320   HCO3 32.8 (H) 06/26/2021 2321   TCO2 34 (H) 07/12/2020 0719   ACIDBASEDEF 4.0 (H) 09/14/2017 0153   O2SAT 39.7 06/26/2021 2321      Coagulation Profile: Recent Labs  Lab 07/08/21 0540  INR 1.3*     Cardiac Enzymes: No results for input(s): CKTOTAL, CKMB, CKMBINDEX, TROPONINI in the last 168 hours.  HbA1C: Hemoglobin A1C  Date/Time Value Ref Range Status  12/11/2017 12:00 AM 6.2  Final   Hgb A1c MFr Bld  Date/Time Value Ref Range Status  07/03/2021 05:27 AM 8.4 (H) 4.8 - 5.6 % Final    Comment:    (NOTE) Pre diabetes:          5.7%-6.4%  Diabetes:              >6.4%  Glycemic control for   <7.0% adults with diabetes   06/30/2021 07:30 AM 8.3 (H) 4.8 - 5.6 % Final    Comment:    (NOTE) Pre diabetes:          5.7%-6.4%  Diabetes:              >6.4%  Glycemic control for    <7.0% adults with diabetes     CBG: Recent Labs  Lab 07/08/21 1728 07/08/21 2109 07/09/21 0745 07/09/21 1140 07/09/21 1510  GLUCAP 141* 183* 231* 202* 230*     Review of Systems:   A 10 point review of systems was performed and it is as noted above otherwise negative.  Allergies [name]   Scheduled Meds:  acetaminophen  1,000 mg Oral TID   allopurinol  100 mg Oral Daily   amiodarone  200 mg Oral Daily   aspirin EC  81 mg Oral Daily   atorvastatin  40 mg Oral Daily   Chlorhexidine Gluconate Cloth  6 each Topical Q0600   docusate sodium  100  mg Oral Daily   epoetin (EPOGEN/PROCRIT) injection  10,000 Units Intravenous Q M,W,F-HD   heparin injection (subcutaneous)  5,000 Units Subcutaneous Q8H   insulin aspart  0-5 Units Subcutaneous QHS   insulin aspart  0-6 Units Subcutaneous TID WC   levETIRAcetam  500 mg Oral BID   levETIRAcetam  500 mg Oral Once per day on Mon Wed Fri   midodrine  10 mg Oral TID AC   mirtazapine  7.5 mg Oral QHS   pantoprazole  40 mg Oral Daily   potassium chloride  20 mEq Oral Once   senna-docusate  1 tablet Oral QHS   sertraline  25 mg Oral QHS   sevelamer carbonate  800 mg Oral TID WC   Continuous Infusions:  magnesium sulfate bolus IVPB     phenylephrine (NEO-SYNEPHRINE) Adult infusion 20 mcg/min (07/09/21 0400)   PRN Meds:.alum & mag hydroxide-simeth, feeding supplement (NEPRO CARB STEADY), guaiFENesin-dextromethorphan, hydrALAZINE, ketorolac, labetalol, magnesium sulfate bolus IVPB, ondansetron **OR** ondansetron (ZOFRAN) IV, phenol, polyethylene glycol, traZODone   Level 3 follow-up    Discussed with Dr. Feliberto Gottron.   Renold Don, MD Advanced Bronchoscopy PCCM [facility]    *This note was dictated using voice recognition software/Dragon.  Despite best efforts to proofread, errors can occur which can change the meaning.  Any change was purely unintentional.

## 2021-07-09 NOTE — Progress Notes (Signed)
Dr. Patsey Berthold notified of patients map of (49). Per MD if patient is mentating do not turn neo back on. Orders for Map goal 50. Continue to assess.

## 2021-07-09 NOTE — Progress Notes (Signed)
1 Day Post-Op   Subjective/Chief Complaint: Patient off neo gtt.  He is talking to his daughter and states that he feels well.  He seems to be mentating at baseline.   Objective: Vital signs in last 24 hours: Temp:  [97.7 F (36.5 C)-98.5 F (36.9 C)] 98.1 F (36.7 C) (09/25 1100) Pulse Rate:  [65-91] 76 (09/25 1215) Resp:  [10-22] 11 (09/25 1215) BP: (57-177)/(16-130) 85/31 (09/25 1215) SpO2:  [89 %-100 %] 100 % (09/25 1215) FiO2 (%):  [32 %] 32 % (09/24 1511) Weight:  [81.4 kg-82.7 kg] 82.7 kg (09/25 0326) Last BM Date: 07/05/21  Intake/Output from previous day: 09/24 0701 - 09/25 0700 In: 3184.1 [P.O.:120; I.V.:1299; IV Piggyback:1765.1] Out: 25 [Blood:25] Intake/Output this shift: No intake/output data recorded.  General appearance: alert, cooperative, appears older than stated age, and no distress Resp: clear to auscultation bilaterally Cardio: regular rate and rhythm, S1, S2 normal, no murmur, click, rub or gallop Extremities: Right stump dressing intact.  The left foot has doppler multiphasic signals.  Incision/Wound: Intact  Lab Results:  Recent Labs    07/08/21 1813 07/09/21 0630  WBC 22.7* 23.0*  HGB 9.6* 9.0*  HCT 29.9* 29.2*  PLT 335 285   BMET Recent Labs    07/08/21 0540 07/08/21 1813 07/09/21 0630  NA 138  --  135  K 3.4*  --  4.8  CL 95*  --  94*  CO2 30  --  20*  GLUCOSE 64*  --  246*  BUN 31*  --  47*  CREATININE 4.39* 5.13* 6.03*  CALCIUM 9.2  --  9.2   PT/INR Recent Labs    07/08/21 0540  LABPROT 16.5*  INR 1.3*   ABG No results for input(s): PHART, HCO3 in the last 72 hours.  Invalid input(s): PCO2, PO2  Studies/Results: No results found.  Anti-infectives: Anti-infectives (From admission, onward)    Start     Dose/Rate Route Frequency Ordered Stop   07/09/21 0600  ceFAZolin (ANCEF) IVPB 1 g/50 mL premix  Status:  Discontinued        1 g 100 mL/hr over 30 Minutes Intravenous On call to O.R. 07/08/21 1510 07/08/21 1612    07/01/21 1000  ceFEPIme (MAXIPIME) 1 g in sodium chloride 0.9 % 100 mL IVPB  Status:  Discontinued        1 g 200 mL/hr over 30 Minutes Intravenous Every 24 hours 06/30/21 1254 07/09/21 1229   06/30/21 1600  vancomycin (VANCOCIN) IVPB 1000 mg/200 mL premix  Status:  Discontinued        1,000 mg 200 mL/hr over 60 Minutes Intravenous Every M-W-F (Hemodialysis) 06/30/21 1254 07/09/21 1229   06/29/21 0800  ceFAZolin (ANCEF) IVPB 1 g/50 mL premix  Status:  Discontinued       Note to Pharmacy: To be given in specials   1 g 100 mL/hr over 30 Minutes Intravenous  Once 06/28/21 1441 06/28/21 1651   06/28/21 1513  ceFAZolin (ANCEF) 1-4 GM/50ML-% IVPB       Note to Pharmacy: Corlis Hove   : cabinet override      06/28/21 1513 06/29/21 0314   06/28/21 1200  vancomycin (VANCOCIN) IVPB 1000 mg/200 mL premix  Status:  Discontinued        1,000 mg 200 mL/hr over 60 Minutes Intravenous Every M-W-F (Hemodialysis) 06/27/21 0959 06/30/21 1254   06/27/21 1100  metroNIDAZOLE (FLAGYL) IVPB 500 mg        500 mg 100 mL/hr over 60 Minutes Intravenous  Every 12 hours 06/27/21 0250 07/04/21 1059   06/27/21 1100  vancomycin (VANCOREADY) IVPB 750 mg/150 mL        750 mg 150 mL/hr over 60 Minutes Intravenous  Once 06/27/21 1000 06/27/21 1137   06/27/21 1000  ceFEPIme (MAXIPIME) 1 g in sodium chloride 0.9 % 100 mL IVPB  Status:  Discontinued        1 g 200 mL/hr over 30 Minutes Intravenous  Once 06/27/21 0250 06/27/21 0316   06/27/21 1000  ceFEPIme (MAXIPIME) 1 g in sodium chloride 0.9 % 100 mL IVPB  Status:  Discontinued        1 g 200 mL/hr over 30 Minutes Intravenous Every 24 hours 06/27/21 0316 06/30/21 1254   06/27/21 0336  vancomycin (VANCOCIN) IVPB 1000 mg/200 mL premix  Status:  Discontinued        1,000 mg 200 mL/hr over 60 Minutes Intravenous Every Dialysis 06/27/21 0337 06/27/21 1258   06/27/21 0330  vancomycin (VANCOREADY) IVPB 750 mg/150 mL  Status:  Discontinued        750 mg 150 mL/hr over 60  Minutes Intravenous  Once 06/27/21 0258 06/27/21 0959   06/27/21 0300  vancomycin (VANCOCIN) IVPB 1000 mg/200 mL premix  Status:  Discontinued        1,000 mg 200 mL/hr over 60 Minutes Intravenous  Once 06/27/21 0250 06/27/21 0258   06/27/21 0115  piperacillin-tazobactam (ZOSYN) IVPB 3.375 g        3.375 g 100 mL/hr over 30 Minutes Intravenous  Once 06/27/21 0112 06/27/21 0255   06/26/21 2330  cefTRIAXone (ROCEPHIN) 1 g in sodium chloride 0.9 % 100 mL IVPB  Status:  Discontinued        1 g 200 mL/hr over 30 Minutes Intravenous  Once 06/26/21 2321 06/27/21 0111   06/26/21 2330  vancomycin (VANCOCIN) IVPB 1000 mg/200 mL premix        1,000 mg 200 mL/hr over 60 Minutes Intravenous  Once 06/26/21 2321 06/27/21 0210       Assessment/Plan: s/p Procedure(s): AMPUTATION ABOVE KNEE (Right) Will continue with all measures Can stop antibiotics Discussed patient condition with the daughter BP is still low, MAP 50, mentation seems to be at baseline.  LOS: 12 days    Elmore Guise 07/09/2021

## 2021-07-09 NOTE — TOC Progression Note (Addendum)
Transition of Care [facility]) - Progression Note    Patient Details  Name: Ernest [name] MRN: [id number] Date of Birth: Oct 07, 1946  Transition of Care [facility]) CM/SW [place], [place] Phone Number: [phone] 07/09/2021, 11:20 AM  Clinical Narrative:     Patient underwent above-the-knee amputation on 07/08/2021. CIR was consulted for rehab but patient's insurance is out-of-network and they will not accept the patient. CSW spoke with the patient's daughter [name] (Daughter)  [phone] [facility]), who stated she would prefer for the patient not to return to [facility]. Patient has been accepted back to [facility], has been there since February 2022. [name] had just arrived and will contact this CSW later on today.  PT recommendation is for SNF placement. TOC following.  Expected Discharge Plan: [place] Barriers to Discharge: Continued Medical Work up  Expected Discharge Plan and Services Expected Discharge Plan: [place] In-house Referral: Clinical Social Work   Post Acute Care Choice: [place] Living arrangements for the past 2 months: [place]                                       Social Determinants of Health (SDOH) Interventions    Readmission Risk Interventions Readmission Risk Prevention Plan 06/28/2021  Transportation Screening Complete  Medication Review Press photographer) Complete  PCP or Specialist appointment within 3-5 days of discharge Complete  HRI or Home Care Consult Complete  SW Recovery Care/Counseling Consult Complete  Palliative Care Screening Not [place] Complete  Some recent data might be hidden

## 2021-07-09 NOTE — Progress Notes (Signed)
Inpatient Rehab Admissions Coordinator:   CIR consult received. CIR is not in network with Pt.'s insurance. TOC notified that Pt. Will likely need SNF vs AIR/IRF at an in network facility.  CIR will sign off.   Clemens Catholic, [place], [place] Admissions Coordinator  [phone] ([place]) [phone] (office)

## 2021-07-10 DIAGNOSIS — R509 Fever, unspecified: Secondary | ICD-10-CM | POA: Diagnosis not present

## 2021-07-10 DIAGNOSIS — Z89611 Acquired absence of right leg above knee: Secondary | ICD-10-CM

## 2021-07-10 DIAGNOSIS — I96 Gangrene, not elsewhere classified: Secondary | ICD-10-CM | POA: Diagnosis present

## 2021-07-10 DIAGNOSIS — D638 Anemia in other chronic diseases classified elsewhere: Secondary | ICD-10-CM

## 2021-07-10 DIAGNOSIS — M869 Osteomyelitis, unspecified: Secondary | ICD-10-CM | POA: Diagnosis not present

## 2021-07-10 DIAGNOSIS — N186 End stage renal disease: Secondary | ICD-10-CM | POA: Diagnosis not present

## 2021-07-10 DIAGNOSIS — J9601 Acute respiratory failure with hypoxia: Secondary | ICD-10-CM | POA: Diagnosis not present

## 2021-07-10 DIAGNOSIS — A419 Sepsis, unspecified organism: Secondary | ICD-10-CM | POA: Diagnosis not present

## 2021-07-10 LAB — MAGNESIUM: Magnesium: 2 mg/dL (ref 1.7–2.4)

## 2021-07-10 LAB — GLUCOSE, CAPILLARY
Glucose-Capillary: 240 mg/dL — ABNORMAL HIGH (ref 70–99)
Glucose-Capillary: 145 mg/dL — ABNORMAL HIGH (ref 70–99)
Glucose-Capillary: 147 mg/dL — ABNORMAL HIGH (ref 70–99)
Glucose-Capillary: 185 mg/dL — ABNORMAL HIGH (ref 70–99)

## 2021-07-10 LAB — BASIC METABOLIC PANEL
Anion gap: 13 (ref 5–15)
BUN: 57 mg/dL — ABNORMAL HIGH (ref 8–23)
CO2: 24 mmol/L (ref 22–32)
Calcium: 9 mg/dL (ref 8.9–10.3)
Chloride: 98 mmol/L (ref 98–111)
Creatinine, Ser: 7.39 mg/dL — ABNORMAL HIGH (ref 0.61–1.24)
GFR, Estimated: 7 mL/min — ABNORMAL LOW (ref >60)
Glucose, Bld: 263 mg/dL — ABNORMAL HIGH (ref 70–99)
Potassium: 4.8 mmol/L (ref 3.5–5.1)
Sodium: 135 mmol/L (ref 135–145)

## 2021-07-10 LAB — CBC
HCT: 25.3 % — ABNORMAL LOW (ref 39.0–52.0)
Hemoglobin: 8.2 g/dL — ABNORMAL LOW (ref 13.0–17.0)
MCH: 28.5 pg (ref 26.0–34.0)
MCHC: 32.4 g/dL (ref 30.0–36.0)
MCV: 87.8 fL (ref 80.0–100.0)
Platelets: 258 10*3/uL (ref 150–400)
RBC: 2.88 MIL/uL — ABNORMAL LOW (ref 4.22–5.81)
RDW: 16.8 % — ABNORMAL HIGH (ref 11.5–15.5)
WBC: 15.8 10*3/uL — ABNORMAL HIGH (ref 4.0–10.5)
nRBC: 0 % (ref 0.0–0.2)

## 2021-07-10 LAB — PHOSPHORUS: Phosphorus: 6.8 mg/dL — ABNORMAL HIGH (ref 2.5–4.6)

## 2021-07-10 MED ORDER — HEPARIN SODIUM (PORCINE) 1000 UNIT/ML IJ SOLN
INTRAMUSCULAR | Status: AC
  Filled 2021-07-10: qty 1

## 2021-07-10 NOTE — Progress Notes (Signed)
PROGRESS NOTE    Ernest [name]  [id number] DOB: [birth year] DOA: 06/26/2021 PCP: Patient, [name] (Inactive)   Assessment & Plan:   Principal Problem:   Wet gangrene ([place]) Active Problems:   Uncontrolled type 2 diabetes mellitus with end-stage renal disease ([place])   ESRD (end stage renal disease) ([place])   Septic shock (HCC)   PAD (peripheral artery disease) ([place])   S/P AKA (above knee amputation), right (HCC)   Hypotension: postoperatively. Weaned off of pressors. Continue on midodrine. Keep MAP >65. Resolved   Gangrene of the right lower extremity: w/ associated sepsis. See previous ICU note on how pt met sepsis criteria. Sepsis resolved. S/p right AKA.  Leukocytosis: likely secondary to above. Will continue to monitor    ESRD: continue on HD MWF. Management as per nephro  ACD: likely secondary to ESRD. No need for a transfusion currently    Gout: continue on allopurinol  PAF: continue on home dose of amio  Chronic combined CHF: volume management w/ HD.   DM2: poorly controlled, HbA1c 8.4. Continue on SSI w/ accuchecks  Acute metabolic encephalopathy vs vascular dementia: re-orient prn. Oriented to self only. Continue w/ supportive care  PVD: continue on aspirin, statin  Hx of CAD: s/p CABG. Continue on statin, aspirin.    DVT prophylaxis: heparin  Code Status: full  Family Communication:  Disposition Plan: unclear, possibly d/c to SNF   Level of care: Stepdown  Status is: Inpatient  Remains inpatient appropriate because:Ongoing diagnostic testing needed not appropriate for outpatient work up, Unsafe d/c plan, IV treatments appropriate due to intensity of illness or inability to take PO, and Inpatient level of care appropriate due to severity of illness  Dispo: The patient is from: Home              Anticipated d/c is to: SNF              Patient currently is not medically stable to d/c.   Difficult to place patient : unclear     Consultants:   Vascular surg  Nephro   Procedures: AKA  Antimicrobials: (   Subjective: Pt c/o dressing on his stump being too tight  Objective: Vitals:   07/10/21 0500 07/10/21 0600 07/10/21 0700 07/10/21 0800  BP:  (!) 99/34 (!) 88/36 (!) 126/95  Pulse:  (!) 58 (!) 56 70  Resp:   (!) 9 10  Temp:   98.2 F (36.8 C)   TempSrc:      SpO2:  100% 100% 100%  Weight: 86.2 kg     Height:       No intake or output data in the 24 hours ending 07/10/21 0811 Filed Weights   07/08/21 1614 07/09/21 0326 07/10/21 0500  Weight: 81.4 kg 82.7 kg 86.2 kg    Examination:  General exam: Appears calm but uncomfortable  Respiratory system: diminished breath sounds b/l  Cardiovascular system: S1 & S2 +. No rubs, gallops or clicks.  Gastrointestinal system: Abdomen is nondistended, soft and nontender. Hypoactive bowel sounds heard. Central nervous system: Alert and oriented to self only. Moves all extremities  Psychiatry: Judgement and insight appear poor. Flat mood and affect    Data Reviewed: I have personally reviewed following labs and imaging studies  CBC: Recent Labs  Lab 07/05/21 1442 07/08/21 0540 07/08/21 1813 07/09/21 0630 07/10/21 0516  WBC 22.2* 14.1* 22.7* 23.0* 15.8*  HGB 9.4* 8.6* 9.6* 9.0* 8.2*  HCT 28.6* 26.2* 29.9* 29.2* 25.3*  MCV 87.2 87.3  87.4 88.5 87.8  PLT 340 232 335 285 814   Basic Metabolic Panel: Recent Labs  Lab 07/05/21 1442 07/08/21 0540 07/08/21 1813 07/09/21 0630 07/10/21 0516  NA 136 138  --  135 135  K 3.1* 3.4*  --  4.8 4.8  CL 96* 95*  --  94* 98  CO2 27 30  --  20* 24  GLUCOSE 171* 64*  --  246* 263*  BUN 25* 31*  --  47* 57*  CREATININE 3.31* 4.39* 5.13* 6.03* 7.39*  CALCIUM 8.7* 9.2  --  9.2 9.0  MG  --  2.0  --  1.9 2.0  PHOS 3.2 4.3  --  6.8* 6.8*   GFR: Estimated Creatinine Clearance: 9.8 mL/min (A) (by C-G formula based on SCr of 7.39 mg/dL (H)). Liver Function Tests: No results for input(s): AST, ALT, ALKPHOS, BILITOT, PROT,  ALBUMIN in the last 168 hours. No results for input(s): LIPASE, AMYLASE in the last 168 hours. No results for input(s): AMMONIA in the last 168 hours. Coagulation Profile: Recent Labs  Lab 07/08/21 0540  INR 1.3*   Cardiac Enzymes: No results for input(s): CKTOTAL, CKMB, CKMBINDEX, TROPONINI in the last 168 hours. BNP (last 3 results) No results for input(s): PROBNP in the last 8760 hours. HbA1C: No results for input(s): HGBA1C in the last 72 hours. CBG: Recent Labs  Lab 07/09/21 0745 07/09/21 1140 07/09/21 1510 07/09/21 2105 07/10/21 0726  GLUCAP 231* 202* 230* 205* 240*   Lipid Profile: No results for input(s): CHOL, HDL, LDLCALC, TRIG, CHOLHDL, LDLDIRECT in the last 72 hours. Thyroid Function Tests: No results for input(s): TSH, T4TOTAL, FREET4, T3FREE, THYROIDAB in the last 72 hours. Anemia Panel: No results for input(s): VITAMINB12, FOLATE, FERRITIN, TIBC, IRON, RETICCTPCT in the last 72 hours. Sepsis Labs: No results for input(s): PROCALCITON, LATICACIDVEN in the last 168 hours.  No results found for this or any previous visit (from the past 240 hour(s)).       Radiology Studies: No results found.      Scheduled Meds:  acetaminophen  1,000 mg Oral TID   allopurinol  100 mg Oral Daily   amiodarone  200 mg Oral Daily   aspirin EC  81 mg Oral Daily   atorvastatin  40 mg Oral Daily   Chlorhexidine Gluconate Cloth  6 each Topical Q0600   docusate sodium  100 mg Oral Daily   epoetin (EPOGEN/PROCRIT) injection  10,000 Units Intravenous Q M,W,F-HD   heparin injection (subcutaneous)  5,000 Units Subcutaneous Q8H   insulin aspart  0-9 Units Subcutaneous TID AC & HS   levETIRAcetam  500 mg Oral BID   levETIRAcetam  500 mg Oral Once per day on Mon Wed Fri   midodrine  10 mg Oral TID AC   mirtazapine  7.5 mg Oral QHS   pantoprazole  40 mg Oral Daily   potassium chloride  20 mEq Oral Once   senna-docusate  1 tablet Oral QHS   sertraline  25 mg Oral QHS    sevelamer carbonate  800 mg Oral TID WC   Continuous Infusions:  magnesium sulfate bolus IVPB     phenylephrine (NEO-SYNEPHRINE) Adult infusion 20 mcg/min (07/09/21 0400)     LOS: 13 days    Time spent: 30 mins     Wyvonnia Dusky, MD [facility] Pager [phone]  If 7PM-7AM, please contact night-coverage 07/10/2021, 8:11 AM

## 2021-07-10 NOTE — Progress Notes (Signed)
[facility]  PROGRESS NOTE   Subjective:   Patient seen and evaluated during dialysis   HEMODIALYSIS FLOWSHEET:  Blood Flow Rate (mL/min): 400 mL/min Arterial Pressure (mmHg): -230 mmHg Venous Pressure (mmHg): 180 mmHg Transmembrane Pressure (mmHg): 50 mmHg Ultrafiltration Rate (mL/min): 0 mL/min Dialysate Flow Rate (mL/min): 500 ml/min Conductivity: Machine : 13.8 Conductivity: Machine : 13.8 Dialysis Fluid Bolus: Normal Saline Bolus Amount (mL): 200 mL  Patient resting comfortably, no complaints of pain  Objective:  Vital signs in last 24 hours:  Temp:  [97.7 F (36.5 C)-98.2 F (36.8 C)] 98.2 F (36.8 C) (09/26 0800) Pulse Rate:  [56-84] 69 (09/26 0815) Resp:  [8-16] 16 (09/26 0815) BP: (74-145)/(22-111) 145/103 (09/26 0815) SpO2:  [96 %-100 %] 99 % (09/26 0815) Weight:  [86.2 kg] 86.2 kg (09/26 0500)  Weight change: 4.783 kg Filed Weights   07/08/21 1614 07/09/21 0326 07/10/21 0500  Weight: 81.4 kg 82.7 kg 86.2 kg    Intake/Output: I/O last 3 completed shifts: In: 2684.1 [P.O.:120; I.V.:799; IV Piggyback:1765.1] Out: -    Intake/Output this shift:  No intake/output data recorded.  Physical Exam: General:  No acute distress  Head:  Normocephalic, atraumatic. Moist oral mucosal membranes  Eyes:  Anicteric  Lungs:   Clear to auscultation, normal effort, [place] O2  Heart:  S1S2 no rubs  Abdomen:   Soft, nontender, bowel sounds present  Extremities:  No peripheral edema.  Neurologic:  Awake, alert, following commands  Skin:  No lesions  Access: Rt groin Permcath    Basic Metabolic Panel: Recent Labs  Lab 07/05/21 1442 07/08/21 0540 07/08/21 1813 07/09/21 0630 07/10/21 0516  NA 136 138  --  135 135  K 3.1* 3.4*  --  4.8 4.8  CL 96* 95*  --  94* 98  CO2 27 30  --  20* 24  GLUCOSE 171* 64*  --  246* 263*  BUN 25* 31*  --  47* 57*  CREATININE 3.31* 4.39* 5.13* 6.03* 7.39*  CALCIUM 8.7* 9.2  --  9.2 9.0  MG  --  2.0  --  1.9 2.0  PHOS  3.2 4.3  --  6.8* 6.8*     CBC: Recent Labs  Lab 07/05/21 1442 07/08/21 0540 07/08/21 1813 07/09/21 0630 07/10/21 0516  WBC 22.2* 14.1* 22.7* 23.0* 15.8*  HGB 9.4* 8.6* 9.6* 9.0* 8.2*  HCT 28.6* 26.2* 29.9* 29.2* 25.3*  MCV 87.2 87.3 87.4 88.5 87.8  PLT 340 232 335 285 258      Urinalysis: No results for input(s): COLORURINE, LABSPEC, PHURINE, GLUCOSEU, HGBUR, BILIRUBINUR, KETONESUR, PROTEINUR, UROBILINOGEN, NITRITE, LEUKOCYTESUR in the last 72 hours.  Invalid input(s): APPERANCEUR    Imaging: No results found.   Medications:    magnesium sulfate bolus IVPB      acetaminophen  1,000 mg Oral TID   allopurinol  100 mg Oral Daily   amiodarone  200 mg Oral Daily   aspirin EC  81 mg Oral Daily   atorvastatin  40 mg Oral Daily   Chlorhexidine Gluconate Cloth  6 each Topical Q0600   docusate sodium  100 mg Oral Daily   epoetin (EPOGEN/PROCRIT) injection  10,000 Units Intravenous Q M,W,F-HD   heparin injection (subcutaneous)  5,000 Units Subcutaneous Q8H   heparin sodium (porcine)       insulin aspart  0-9 Units Subcutaneous TID AC & HS   levETIRAcetam  500 mg Oral BID   levETIRAcetam  500 mg Oral Once per day on Mon Wed Fri  midodrine  10 mg Oral TID AC   mirtazapine  7.5 mg Oral QHS   pantoprazole  40 mg Oral Daily   potassium chloride  20 mEq Oral Once   senna-docusate  1 tablet Oral QHS   sertraline  25 mg Oral QHS   sevelamer carbonate  800 mg Oral TID WC    Assessment/ Plan:      Ernest [name] is a 75 year old male with history of hypertension, coronary artery disease, COPD, congestive heart failure, hepatitis C, hyperlipidemia, ESRD on hemodialysis Monday Wednesday Friday.  He is now being admitted with history of acute respiratory failure and also sepsis.  Principal Problem:   Wet gangrene ([place]) Active Problems:   Uncontrolled type 2 diabetes mellitus with end-stage renal disease ([place])   ESRD (end stage renal disease) ([place])   Septic shock (HCC)    PAD (peripheral artery disease) (HCC)   S/P AKA (above knee amputation), right ([place])  [facility] [address]/MWF/Rt groin Permcath   #1: ESRD: Receiving dialysis today, tolerated well. UF turned off when hypotensive. Patient was able to complete treatment with UF off. Next treatment scheduled for Wednesday   #2: Right lower extremity osteomyelitis: S/p right lower extremity angiogram.  S/p right AKA on 07/08/21.    #3: Anemia with chronic kidney diease: Hgb 8.2. Continue Epogen with HD treatments   #4: Secondary hyperparathyroidism: Calcium at goal and phosphorus elevated. Continue the sevelamer with meals.   #5: Hypertension: Soft BP, prescribed Midodrine. Transferred to stepdown unit postoperatively due to hypotension and lethargy. Was on Neo and given 559m fluid bolus with little resolve of symptoms.    LOS: 1[facility] 9/26/20229:47 AM

## 2021-07-10 NOTE — Discharge Instructions (Signed)
Vascular Surgery Discharge Instructions:  1) You may shower.  Gently clean your stump with soap and water.  Gently pat dry.  2) Daily dressing changes: - Xeroform to staple line - ABD to staple line - Covered with Kerlix - Covered with Coban or Ace - Can be changed more frequently if drainage is noted

## 2021-07-10 NOTE — Progress Notes (Signed)
Patient with Maps 30-60, provider aware, notified not to start pressor if patient MAP <50 and patient remains at baseline orientation level. Remains on 4LNC, denies pain at surgical site, (R) AKA . Q2 turned. No significant events.

## 2021-07-10 NOTE — Evaluation (Addendum)
Occupational Therapy Re-evaluation Patient Details Name: Ernest [name] MRN: [id number] DOB: [birth year] Today's Date: 07/10/2021   History of Present Illness Pt is a 75 y/o M admitted on 06/26/21 from SNF long term care with c/o fever & chills. Pt found to have R heel decubitus ulcer & developed wet gangrene. Pt underwent R AKA on 07/08/21, prompting re-evaluation. PMH: ESRD On HD MWF, chronic systolic CHF, COPD, CAD, DVT, GERD, gout, Hep C, HTN, dyslipidemia, CVA   Clinical Impression   Pt seen for OT re-evaluation on this date s/p R AKA (most recent BP reading 123/85 at 13:30). Upon arrival to room, pt awake and seated upright in bed. Pt A&Ox1, however pleasant and agreeable throughout. Some difficulty assessing cognition in setting of dysarthria, however able to follow 1-step instructions following multi-modal cues. Pt currently presents with decreased strength, decreased activity tolerance, and decreased awareness of deficits. Due to these functional impairments, pt requires MOD A for bed-level grooming tasks and MAX A for LB ADLs. This date, pt engaged in bed-level UE/LE therapy exercises, putting forth good effort until fatigued at end of session. Pt left in bed, in no acute distress, and with all needs within reach. Pt continues to benefit from skilled OT services to maximize return to PLOF and minimize risk of future falls, injury, caregiver burden, and readmission. Goals updated to reflect pt's current functional impairments. Discharge recommendation remains appropriate.       Recommendations for follow up therapy are one component of a multi-disciplinary discharge planning process, led by the attending physician.  Recommendations may be updated based on patient status, additional functional criteria and insurance authorization.   Follow Up Recommendations  SNF    Equipment Recommendations  Other (comment) (defer to next venue of care)       Precautions / Restrictions  Precautions Precautions: Fall Restrictions Weight Bearing Restrictions: Yes RLE Weight Bearing: Non weight bearing      Mobility Bed Mobility   Bed Mobility: Rolling Rolling: Max assist         General bed mobility comments: Attempted to roll to R-side, with pt initiating transfer and reaching for R bedrail with L UE, however unable to fully roll to side with 1-person assist    Transfers                 General transfer comment: unsafe/unable to attempt this date    Balance Overall balance assessment: Needs assistance     Sitting balance - Comments: unable to sit EOB today                                   ADL either performed or assessed with clinical judgement   ADL Overall ADL's : Needs assistance/impaired     Grooming: Wash/dry face;Oral care;Moderate assistance;Bed level Grooming Details (indicate cue type and reason): With proximal joint support, pt able to flex elbow (in gravity eliminated position) and hold/manipulate washcloth and oral swab.                               General ADL Comments: MAX A for LB ADLs      Pertinent Vitals/Pain Pain Assessment: No/denies pain (no pain, just "tightness" and "itchiness" at residual limb)        Extremity/Trunk Assessment Upper Extremity Assessment Upper Extremity Assessment: Generalized weakness (Grip strength 4/5; elbow flex/ext 2+/5; Able to flex  shoulder to ~20 degrees against gravity) RUE Sensation: WNL LUE Sensation: WNL   Lower Extremity Assessment Lower Extremity Assessment: Generalized weakness;Defer to PT evaluation       Communication Communication Communication: HOH   Cognition Arousal/Alertness: Awake/alert Behavior During Therapy: Flat affect Overall Cognitive Status: No family/caregiver present to determine baseline cognitive functioning                                 General Comments: Pt alert and oriented to self only. Pleasant and  agreeable throughout. Some difficulty assessing cognition in setting of dysarthria, however able to follow 1-step instructions following multi-modal cues. Eyes closing only towards end of session, with pt stating that he was fatigued at end of session      Exercises General Exercises - Upper Extremity Shoulder Flexion: AAROM;Both;5 reps;Supine Elbow Flexion: AAROM;Both;5 reps;Supine Elbow Extension: AAROM;Both;5 reps;Supine General Exercises - Lower Extremity Heel Slides: AAROM;Strengthening;Left;10 reps;Supine Hip ABduction/ADduction: AAROM;Strengthening;Both;10 reps;Supine        Home Living Family/patient expects to be discharged to:: Skilled nursing facility                                        Prior Functioning/Environment Level of Independence: Needs assistance  Gait / Transfers Assistance Needed: Pt reports he scoots onto electric scooter at SNF. He says he has not stood or taken steps in several weeks. Per chart review, 7 months ago, he was walking short distances with RW. ADL's / Homemaking Assistance Needed: Required assist for bathing, dressing, and IADLs.   Comments: all information obtained from chart        OT Problem List: Decreased strength;Decreased range of motion;Decreased activity tolerance;Impaired balance (sitting and/or standing);Decreased safety awareness;Decreased knowledge of use of DME or AE;Decreased cognition;Decreased knowledge of precautions;Pain      OT Treatment/Interventions: Self-care/ADL training;Therapeutic exercise;Therapeutic activities;Cognitive remediation/compensation;Energy conservation;Patient/family education;DME and/or AE instruction;Balance training    OT Goals(Current goals can be found in the care plan section) Acute Rehab OT Goals Patient Stated Goal: none stated OT Goal Formulation: With patient Time For Goal Achievement: 07/24/21 ADL Goals Pt Will Perform Eating: with min assist;with adaptive utensils Pt Will  Perform Grooming: with min assist;bed level Pt Will Perform Upper Body Bathing: with min assist;bed level Pt Will Transfer to Toilet: with mod assist;with +2 assist;squat pivot transfer;bedside commode  OT Frequency: Min 1X/week    AM-PAC OT "6 Clicks" Daily Activity     Outcome Measure Help from another person eating meals?: A Lot Help from another person taking care of personal grooming?: A Lot Help from another person toileting, which includes using toliet, bedpan, or urinal?: Total Help from another person bathing (including washing, rinsing, drying)?: Total Help from another person to put on and taking off regular upper body clothing?: A Lot Help from another person to put on and taking off regular lower body clothing?: A Lot 6 Click Score: 10   End of Session Nurse Communication: Mobility status  Activity Tolerance: Patient tolerated treatment well Patient left: in bed;with call bell/phone within reach;with bed alarm set  OT Visit Diagnosis: Unsteadiness on feet (R26.81);Muscle weakness (generalized) (M62.81)                Time: [id number] OT Time Calculation (min): 26 min Charges:  OT General Charges $OT Visit: 1 Visit OT Evaluation $OT Re-eval: 1 Re-eval OT  Treatments $Self Care/Home Management : 8-22 mins $Therapeutic Activity: 8-22 mins  Fredirick Maudlin, OTR/L [place]

## 2021-07-10 NOTE — Progress Notes (Signed)
OT Cancellation Note  Patient Details Name: Ernest [name] MRN: [id number] DOB: August 09, 1946   Cancelled Treatment:    Reason Eval/Treat Not Completed: Patient at procedure or test/ unavailable. Attempted to see pt this morning following recent BP reading of 92/61 (MAP 71), however pt receiving HD upon arrival to room. OT to re-attempt at later time/date as pt is medically appropriate.   Fredirick Maudlin, OTR/L [place]

## 2021-07-10 NOTE — Progress Notes (Addendum)
Physical Therapy Treatment Patient Details Name: Ernest [name] MRN: [id number] DOB: Jul 25, 1946 Today's Date: 07/10/2021   History of Present Illness Pt is a 75 y/o M admitted on 06/26/21 from SNF long term care with c/o fever & chills. Pt found to have R heel decubitus ulcer & developed wet gangrene. Pt underwent R AKA on 07/08/21, prompting re-evaluation. PMH: ESRD On HD MWF, chronic systolic CHF, COPD, CAD, DVT, GERD, gout, Hep C, HTN, dyslipidemia, CVA    PT Comments    Pt seen for PT treatment after speaking with nurse; pt noted to have low BP & MAP but nurse states he has orders to maintain this (BP in L wrist: 106/35 mmHg (MAP 49)). Due to low BP, session focused on bed level exercises. Pt performs BLE strengthening exercises with multimodal cuing for technique but fair<>poor demo. Pt also engages in functional reaching with max cuing & assist for BUE strengthening. Pt's daughter arrived during session & educated her on purpose of PT tx. Will continue to follow pt acutely to progress bed mobility & transfers as able.   Addendum: Pt c/o RLE feeling "tight" but unable to elaborate. No other c/o or behaviors demonstrating pain during session. 3:33 PM 07/10/21    Recommendations for follow up therapy are one component of a multi-disciplinary discharge planning process, led by the attending physician.  Recommendations may be updated based on patient status, additional functional criteria and insurance authorization.  Follow Up Recommendations  SNF;Supervision/Assistance - 24 hour     Equipment Recommendations  None recommended by PT    Recommendations for Other Services      Precautions / Restrictions Precautions Precautions: Fall Restrictions Weight Bearing Restrictions: Yes RLE Weight Bearing: Non weight bearing     Mobility  Bed Mobility Overal bed mobility:  (deferred 2/2 low MAP)              Transfers                 General transfer comment: unsafe/unable  to attempt this date  Ambulation/Gait                 Stairs             Wheelchair Mobility    Modified Rankin (Stroke Patients Only)       Balance Overall balance assessment: Needs assistance     Sitting balance - Comments: unable to sit EOB today                                    Cognition Arousal/Alertness: Lethargic Behavior During Therapy: Flat affect Overall Cognitive Status: Difficult to assess                                 General Comments: Requires cuing to keep eyes open & locate items with eyes as pt frequently closes them during session. Pt requires MAX multimodal cuing for proper technique & to participate in BLE strengthening exercises.      Exercises General Exercises - Upper Extremity Shoulder Flexion: AAROM;Both;5 reps;Supine Elbow Flexion: AAROM;Both;5 reps;Supine Elbow Extension: AAROM;Both;5 reps;Supine General Exercises - Lower Extremity Heel Slides: AAROM;Strengthening;Left;10 reps;Supine (some resistance given when extending LLE) Hip ABduction/ADduction: AAROM;Strengthening;10 reps;Supine;Left;Right (hip abduction slides) Straight Leg Raises: AAROM;Left;Strengthening;10 reps;Supine;Right    General Comments        Pertinent Vitals/Pain Pain Assessment: Faces Faces  Pain Scale: No hurt    Home Living Family/patient expects to be discharged to:: Skilled nursing facility                    Prior Function Level of Independence: Needs assistance  Gait / Transfers Assistance Needed: Pt reports he scoots onto electric scooter at SNF. He says he has not stood or taken steps in several weeks. Per chart review, 7 months ago, he was walking short distances with RW. ADL's / Homemaking Assistance Needed: Required assist for bathing, dressing, and IADLs. Comments: all information obtained from chart   PT Goals (current goals can now be found in the care plan section) Acute Rehab PT Goals Patient  Stated Goal: none stated PT Goal Formulation: Patient unable to participate in goal setting Time For Goal Achievement: 07/23/21 Potential to Achieve Goals: Fair Progress towards PT goals: Progressing toward goals    Frequency    Min 2X/week      PT Plan Current plan remains appropriate    Co-evaluation              AM-PAC PT "6 Clicks" Mobility   Outcome Measure  Help needed turning from your back to your side while in a flat bed without using bedrails?: Total Help needed moving from lying on your back to sitting on the side of a flat bed without using bedrails?: Total Help needed moving to and from a bed to a chair (including a wheelchair)?: Total Help needed standing up from a chair using your arms (e.g., wheelchair or bedside chair)?: Total Help needed to walk in hospital room?: Total Help needed climbing 3-5 steps with a railing? : Total 6 Click Score: 6    End of Session Equipment Utilized During Treatment: Oxygen Activity Tolerance: Patient tolerated treatment well;Patient limited by lethargy Patient left: in bed;with call bell/phone within reach;with bed alarm set;with family/visitor present Nurse Communication: Mobility status (BP/MAP) PT Visit Diagnosis: Muscle weakness (generalized) (M62.81);Difficulty in walking, not elsewhere classified (R26.2);Other abnormalities of gait and mobility (R26.89);Unsteadiness on feet (R26.81)     Time: [id number] PT Time Calculation (min) (ACUTE ONLY): 15 min  Charges:  $Therapeutic Activity: 8-22 mins                     Lavone Nian, PT, DPT 07/10/21, 3:23 PM    Waunita Schooner 07/10/2021, 3:21 PM

## 2021-07-10 NOTE — Progress Notes (Signed)
[facility] Daily Progress Note  07/08/21: Right Above The Knee Amputation  Subjective: Patient resting comfortably in bed this AM.  No acute issues overnight.  Still hypotensive.  Objective: Vitals:   07/10/21 1015 07/10/21 1030 07/10/21 1045 07/10/21 1050  BP: (!) 113/42 (!) 104/45 (!) 130/111 (!) 95/34  Pulse: 65 65 72 72  Resp: 10 (!) '9 11 12  '$ Temp:      TempSrc:      SpO2: 100% 100% 100% 100%  Weight:      Height:       No intake or output data in the 24 hours ending 07/10/21 1053  Physical Exam: A&Ox1, NAD CV: RRR Pulmonary: CTA Bilaterally Abdomen: Soft, Nontender, Nondistended Vascular:  Right lower extremity: Thigh soft.  Operating room dressing is clean and intact.   Laboratory: CBC    Component Value Date/Time   WBC 15.8 (H) 07/10/2021 0516   HGB 8.2 (L) 07/10/2021 0516   HCT 25.3 (L) 07/10/2021 0516   PLT 258 07/10/2021 0516   BMET    Component Value Date/Time   NA 135 07/10/2021 0516   NA 140 12/11/2017 0000   K 4.8 07/10/2021 0516   CL 98 07/10/2021 0516   CO2 24 07/10/2021 0516   GLUCOSE 263 (H) 07/10/2021 0516   BUN 57 (H) 07/10/2021 0516   BUN 16 12/11/2017 0000   CREATININE 7.39 (H) 07/10/2021 0516   CALCIUM 9.0 07/10/2021 0516   CALCIUM 8.4 (L) 10/05/2017 0615   GFRNONAA 7 (L) 07/10/2021 0516   GFRAA 21 (L) 12/04/2019 2027   Assessment/Planning: The patient is a 75 year old male with multiple medical issues who presented to the [facility] emergency department from his nursing facility at [facility] with severe wounds to the right foot now status post right above-the-knee amputation - POD #2  1) Hypotension at times however mentating at baseline. Currently off pressors. 2) Hemoglobin is relatively stable.  Leukocytosis improving. AM CBC. 3) Patient continues to dialyze from his upper extremity access without issue. 4) Will plan on removing the patient's operating room dressing tomorrow. 5)  Unable to work with OT and PT due to hypotension.  6) Social work has been consulted due to placement issues.   Discussed with Dr. Ellis Parents Jacqualyn Sedgwick [name] 07/10/2021 10:53 AM

## 2021-07-10 NOTE — Progress Notes (Signed)
Inpatient Diabetes Program Recommendations  AACE/ADA: New Consensus Statement on Inpatient Glycemic Control (2015)  Target Ranges:  Prepandial:   less than 140 mg/dL      Peak postprandial:   less than 180 mg/dL (1-2 hours)      Critically ill patients:  140 - 180 mg/dL  Results for Ernest [name], Ernest [name] (MRN [id number]) as of 07/10/2021 09:54  Ref. Range 07/08/2021 08:03 07/08/2021 09:45 07/08/2021 11:37 07/08/2021 14:56 07/08/2021 17:28 07/08/2021 21:09  Glucose-Capillary Latest Ref Range: 70 - 99 mg/dL  10 units Levemir given at 2215 on PM of 09/23 48 (L) 114 (H) 111 (H) 108 (H) 141 (H) 183 (H)  Results for Ernest [name], Ernest [name] (MRN [id number]) as of 07/10/2021 09:54  Ref. Range 07/09/2021 07:45 07/09/2021 11:40 07/09/2021 15:10 07/09/2021 21:05  Glucose-Capillary Latest Ref Range: 70 - 99 mg/dL 231 (H)  2 units Novolog  202 (H)  2 units Novolog  230 (H)  2 units Novolog  205 (H)  3 units Novolog   Results for Ernest [name], Ernest [name] (MRN [id number]) as of 07/10/2021 09:54  Ref. Range 07/10/2021 07:26  Glucose-Capillary Latest Ref Range: 70 - 99 mg/dL 240 (H)  3 units Novolog     Home DM Meds: Levemir 23 units QHS  Current Orders: Novolog Sensitive Correction Scale/ SSI (0-9 units) TID AC + HS    MD- Note patient had Severe Hypoglycemic event AM of 09/24 after receiving 10 units Levemir insulin the night prior.  Note Levemir was stopped.  AM CBGs now >200  Please consider starting back Levemir at 5 units Daily     --Will follow patient during hospitalization--  Wyn Quaker RN, MSN, CDE Diabetes Coordinator Inpatient Glycemic Control Team Team Pager: [phone] (8a-5p)

## 2021-07-10 NOTE — Progress Notes (Signed)
Patient was able to complete 3 hours of dialysis treatment today. No fluids removed due to hypotension. Patient stable post treatment. Report given to Georgeanna Lea RN.

## 2021-07-11 DIAGNOSIS — J9601 Acute respiratory failure with hypoxia: Secondary | ICD-10-CM | POA: Diagnosis not present

## 2021-07-11 DIAGNOSIS — A419 Sepsis, unspecified organism: Secondary | ICD-10-CM | POA: Diagnosis not present

## 2021-07-11 DIAGNOSIS — R509 Fever, unspecified: Secondary | ICD-10-CM | POA: Diagnosis not present

## 2021-07-11 DIAGNOSIS — I9589 Other hypotension: Secondary | ICD-10-CM

## 2021-07-11 DIAGNOSIS — I96 Gangrene, not elsewhere classified: Secondary | ICD-10-CM | POA: Diagnosis not present

## 2021-07-11 DIAGNOSIS — M869 Osteomyelitis, unspecified: Secondary | ICD-10-CM | POA: Diagnosis not present

## 2021-07-11 DIAGNOSIS — N186 End stage renal disease: Secondary | ICD-10-CM | POA: Diagnosis not present

## 2021-07-11 LAB — BASIC METABOLIC PANEL
Anion gap: 9 (ref 5–15)
BUN: 32 mg/dL — ABNORMAL HIGH (ref 8–23)
CO2: 28 mmol/L (ref 22–32)
Calcium: 8.4 mg/dL — ABNORMAL LOW (ref 8.9–10.3)
Chloride: 98 mmol/L (ref 98–111)
Creatinine, Ser: 4.79 mg/dL — ABNORMAL HIGH (ref 0.61–1.24)
GFR, Estimated: 12 mL/min — ABNORMAL LOW (ref >60)
Glucose, Bld: 154 mg/dL — ABNORMAL HIGH (ref 70–99)
Potassium: 4 mmol/L (ref 3.5–5.1)
Sodium: 135 mmol/L (ref 135–145)

## 2021-07-11 LAB — CBC
HCT: 25 % — ABNORMAL LOW (ref 39.0–52.0)
Hemoglobin: 7.9 g/dL — ABNORMAL LOW (ref 13.0–17.0)
MCH: 27.3 pg (ref 26.0–34.0)
MCHC: 31.6 g/dL (ref 30.0–36.0)
MCV: 86.5 fL (ref 80.0–100.0)
Platelets: 216 10*3/uL (ref 150–400)
RBC: 2.89 MIL/uL — ABNORMAL LOW (ref 4.22–5.81)
RDW: 17.2 % — ABNORMAL HIGH (ref 11.5–15.5)
WBC: 10.2 10*3/uL (ref 4.0–10.5)
nRBC: 0 % (ref 0.0–0.2)

## 2021-07-11 LAB — PHOSPHORUS: Phosphorus: 4.3 mg/dL (ref 2.5–4.6)

## 2021-07-11 LAB — GLUCOSE, CAPILLARY
Glucose-Capillary: 144 mg/dL — ABNORMAL HIGH (ref 70–99)
Glucose-Capillary: 137 mg/dL — ABNORMAL HIGH (ref 70–99)
Glucose-Capillary: 117 mg/dL — ABNORMAL HIGH (ref 70–99)
Glucose-Capillary: 140 mg/dL — ABNORMAL HIGH (ref 70–99)

## 2021-07-11 LAB — MAGNESIUM: Magnesium: 1.7 mg/dL (ref 1.7–2.4)

## 2021-07-11 LAB — SURGICAL PATHOLOGY

## 2021-07-11 NOTE — TOC Progression Note (Signed)
Transition of Care [facility]) - Progression Note    Patient Details  Name: Ernest [name] MRN: [id number] Date of Birth: 07-07-1946  [facility]) CM/SW [place], [place] Phone Number: 07/11/2021, 6:56 PM  Clinical Narrative:     Patient is from [facility], long term care.  CSW received confirmation from Neoma Laming at [facility] [phone] patient can return to facility when ready for d/c. PT/OT have recommended SNF.  Expected Discharge Plan: [place] Barriers to Discharge: Continued Medical Work up  Expected Discharge Plan and Services Expected Discharge Plan: [place] In-house Referral: Clinical Social Work   Post Acute Care Choice: [place] Living arrangements for the past 2 months: [place]                                       Social Determinants of Health (SDOH) Interventions    Readmission Risk Interventions Readmission Risk Prevention Plan 06/28/2021  Transportation Screening Complete  Medication Review Press photographer) Complete  PCP or Specialist appointment within 3-5 days of discharge Complete  HRI or Home Care Consult Complete  SW Recovery Care/Counseling Consult Complete  Palliative Care Screening Not [place] Complete  Some recent data might be hidden

## 2021-07-11 NOTE — Progress Notes (Signed)
[facility] Daily Progress Note  07/08/21: Right Above The Knee Amputation   Subjective: Patient resting comfortably in bed this AM.  No acute issues overnight.  Still hypotensive.  Subjective: Patient resting comfortably in bed this AM.  No acute issues overnight.  Objective: Vitals:   07/11/21 0816 07/11/21 0817 07/11/21 0900 07/11/21 1000  BP:   108/76 (!) 85/56  Pulse: 67 65 65 71  Resp: (!) '24 13 14 17  '$ Temp:   97.8 F (36.6 C)   TempSrc:   Oral   SpO2: (!) 86% 96% 97% 92%  Weight:      Height:        Intake/Output Summary (Last 24 hours) at 07/11/2021 1118 Last data filed at 07/11/2021 0800 Gross per 24 hour  Intake 329.58 ml  Output -304 ml  Net 633.58 ml   Physical Exam: A&Ox3, NAD CV: RRR Pulmonary: CTA Bilaterally Abdomen: Soft, Nontender, Nondistended Vascular:  Right lower extremity: Thigh soft.  Operating room dressing removed.  Stump is warm and healthy.  Incision is clean dry and intact.   Laboratory: CBC    Component Value Date/Time   WBC 10.2 07/11/2021 0503   HGB 7.9 (L) 07/11/2021 0503   HCT 25.0 (L) 07/11/2021 0503   PLT 216 07/11/2021 0503   BMET    Component Value Date/Time   NA 135 07/11/2021 0503   NA 140 12/11/2017 0000   K 4.0 07/11/2021 0503   CL 98 07/11/2021 0503   CO2 28 07/11/2021 0503   GLUCOSE 154 (H) 07/11/2021 0503   BUN 32 (H) 07/11/2021 0503   BUN 16 12/11/2017 0000   CREATININE 4.79 (H) 07/11/2021 0503   CALCIUM 8.4 (L) 07/11/2021 0503   CALCIUM 8.4 (L) 10/05/2017 0615   GFRNONAA 12 (L) 07/11/2021 0503   GFRAA 21 (L) 12/04/2019 2027   Assessment/Planning: The patient is a 75 year old male with multiple medical issues who presented to the [facility] emergency department from his nursing facility at [facility] with severe wounds to the right foot now status post right above-the-knee amputation - POD #3   1) Hypotension at times however mentating at baseline. Currently off  pressors. No distress. 2) Hemoglobin is relatively stable.  Leukocytosis improving. AM CBC. 3) Operating room dressing was removed today.  Physical examination was notable for a warm healthy stump.  Incision is clean dry and intact. 4) Physical and Occupational Therapy recommending SNF.  We are in agreement. 5) On aspirin and statin for medical management. 5) From a vascular standpoint, the patient can be discharged to SNF when medically stable. 6) Social work has been consulted due to placement issues.    Discussed with Dr. Ellis Parents Kleigh Hoelzer [name] 07/11/2021 11:18 AM

## 2021-07-11 NOTE — Progress Notes (Signed)
Ernest [name]   Ernest [name] was taken from Ernest [name] is a 75 y.o. African-American male with medical history significant for end-stage renal disease on hemodialysis Monday Wednesday and Friday, chronic systolic CHF, COPD, coronary artery disease, DVT, GERD, gout, hepatitis C, hypertension, dyslipidemia and CVA, who presented to emergency room with acute onset of fever with associated chills that started today at the skilled nursing facility.  The patient has a right heel decubitus ulcer for which has been getting oral antibiotics however the wound has been getting progressively worse.  His right heel pain has been sharp and throbbing.  He was noted to have leukocytosis at his nursing home.  His roommate tested positive for COVID-19 however the patient tested negative there.  He denies any cough or wheezing or dyspnea or chest pain or palpitations.  He was noted to be hypoxic however.  No nausea or vomiting or abdominal pain.  ED Course: Upon presentation to the ER, temperature 100.1 and pulse ox 90 was 97% on 4 L of O2 by nasal cannula, vital signs otherwise within normal.  Later on respiratory rate was up to 26.  Labs revealed mild hyperkalemia 5.3 and hyperglycemia 330 with a BUN of 62 and creatinine of 7.02 and albumin 2.4.  Lactic acid 3.1 and later 2.9 with procalcitonin of 13.2.  CBC showed leukocytosis 19.6 with neutrophilia and anemia.  INR was 1.4 with PT of 16.7 and PTT 34.  Influenza antigens and COVID-19 PCR came back negative.  Blood cultures were drawn.   EKG as reviewed by me : EKG showed normal sinus rhythm with a rate of 80 with prolonged PR interval left anterior fascicular block and abnormal R wave progression with T wave inversion laterally. Imaging: Portable chest x-ray showed chronic airspace disease throughout the mid and lower left lung with a stable chronic loculated left pleural effusion with no new acute findings. Right foot x-ray revealed soft tissue ulceration along  the right heel with adjacent area of cortical destruction noted in the right calcaneus suspicious for acute osteomyelitis.  The patient was given 2050 mL IV lactated ringer bolus, IV Zosyn and vancomycin.  He will be admitted to a med monitored bed for further evaluation and management   As per Dr. Patsey Berthold:  Ernest [name] is a 75 year old patient with end-stage renal disease on hemodialysis Monday Wednesday Friday, multiple comorbidities as noted below, who presented on 26 June 2021 from his skilled nursing facility with the onset of fever with associated chills.  The patient was noted to have a right heel decubitus ulcer for which she had been getting oral antibiotics.  The wound has been getting progressively worse.  The patient developed wet gangrene and required above-the-knee amputation today.  Postoperatively the patient has been asymptomatic but somewhat lethargic and his blood pressures have been "soft" he is on Neo-Synephrine at 90 mcg/min.  He has received 500 mL of fluid bolus.  He did receive dialysis 9/23.  It is noted best his blood pressures have been in the 277 systolics during this hospitalization as noted the patient is lethargic.  When arousable he does state that he feels he has some discomfort in his chest but cannot describe it as pain per se he does have any other complaint except postoperative pain.  We are asked to follow the patient due to his hypotension.  The patient will be in the stepdown unit.  9/24 right above-the-knee amputation due to gangrene   Harrison DOB:  1946/01/27 DOA: 06/26/2021 PCP: Patient, [name] (Inactive)   Assessment & Plan:   Principal Problem:   Wet gangrene ([place]) Active Problems:   Uncontrolled type 2 diabetes mellitus with end-stage renal disease ([place])   ESRD (end stage renal disease) ([place])   Septic shock (HCC)   PAD (peripheral artery disease) (HCC)   S/P AKA (above knee amputation), right (HCC)   Hypotension:  postoperatively. Weaned off of pressors. Labile. Keep MAP >65. Continue on midodrine   Gangrene of the right lower extremity: w/ associated sepsis. See previous ICU [name] on how pt met sepsis criteria. Sepsis resolved. S/p right AKA.  Leukocytosis: resolved   ESRD: continue on HD MWF. Management as per nephro   ACD: likely secondary to ESRD. Will transfuse if Hb <7.0/.  Gout: continue on allopurinol   PAF: continue on amiodarone   Chronic combined CHF: volume management w/ HD   DM2: poorly controlled, HbA1c 8.4. Continue on SSI w/ accuchecks  Acute metabolic encephalopathy vs vascular dementia: re-orient prn. Oriented to self only. Continue w/ supportive care  PVD: continue on aspirin, statin   Hx of CAD: s/p CABG. Continue on statin, aspirin.    DVT prophylaxis: heparin  Code Status: full  Family Communication:  Disposition Plan: unclear, possibly d/c to SNF   Level of care: Stepdown  Status is: Inpatient  Remains inpatient appropriate because:Ongoing diagnostic testing needed not appropriate for outpatient work up, Unsafe d/c plan, IV treatments appropriate due to intensity of illness or inability to take PO, and Inpatient level of care appropriate due to severity of illness  Dispo: The patient is from: Home              Anticipated d/c is to: SNF              Patient currently is not medically stable to d/c.   Difficult to place patient : unclear     Consultants:  Vascular surg  Nephro   Procedures: AKA  Antimicrobials: (   Subjective: Pt c/o being uncomfortable   Objective: Vitals:   07/11/21 0400 07/11/21 0500 07/11/21 0700 07/11/21 0800  BP: (!) 106/31  103/79 (!) 90/53  Pulse: 60  64 66  Resp: _0 Temp: 98.4 F (36.9 C)   98.3 F (36.8 C)  TempSrc: Oral   Oral  SpO2: 100%  99% 93%  Weight:  86 kg    Height:        Intake/Output Summary (Last 24 hours) at 07/11/2021 0844 Last data filed at 07/10/2021 1800 Gross per 24 hour  Intake  209.58 ml  Output -304 ml  Net 513.58 ml   Filed Weights   07/09/21 0326 07/10/21 0500 07/11/21 0500  Weight: 82.7 kg 86.2 kg 86 kg    Examination:  General exam: Appears uncomfortable. Poor speech quality  Respiratory system: decreased breath sounds b/l Cardiovascular system: S1/S2+. No rubs or clicks  Gastrointestinal system: Abd is soft, NT, ND & hypoactive bowel sounds Central nervous system: oriented to self only.  Psychiatry: Judgement and insight appear poor. Flat mood and affect     Data Reviewed: I have personally reviewed following labs and imaging studies  CBC: Recent Labs  Lab 07/08/21 0540 07/08/21 1813 07/09/21 0630 07/10/21 0516 07/11/21 0503  WBC 14.1* 22.7* 23.0* 15.8* 10.2  HGB 8.6* 9.6* 9.0* 8.2* 7.9*  HCT 26.2* 29.9* 29.2* 25.3* 25.0*  MCV 87.3 87.4 88.5 87.8 86.5  PLT 232 335 285 258 216   Basic  Metabolic Panel: Recent Labs  Lab 07/05/21 1442 07/08/21 0540 07/08/21 1813 07/09/21 0630 07/10/21 0516 07/11/21 0503  NA 136 138  --  135 135 135  K 3.1* 3.4*  --  4.8 4.8 4.0  CL 96* 95*  --  94* 98 98  CO2 27 30  --  20* 24 28  GLUCOSE 171* 64*  --  246* 263* 154*  BUN 25* 31*  --  47* 57* 32*  CREATININE 3.31* 4.39* 5.13* 6.03* 7.39* 4.79*  CALCIUM 8.7* 9.2  --  9.2 9.0 8.4*  MG  --  2.0  --  1.9 2.0 1.7  PHOS 3.2 4.3  --  6.8* 6.8* 4.3   GFR: Estimated Creatinine Clearance: 15.1 mL/min (A) (by C-G formula based on SCr of 4.79 mg/dL (H)). Liver Function Tests: No results for input(s): AST, ALT, ALKPHOS, BILITOT, PROT, ALBUMIN in the last 168 hours. No results for input(s): LIPASE, AMYLASE in the last 168 hours. No results for input(s): AMMONIA in the last 168 hours. Coagulation Profile: Recent Labs  Lab 07/08/21 0540  INR 1.3*   Cardiac Enzymes: No results for input(s): CKTOTAL, CKMB, CKMBINDEX, TROPONINI in the last 168 hours. BNP (last 3 results) No results for input(s): PROBNP in the last 8760 hours. HbA1C: No results for  input(s): HGBA1C in the last 72 hours. CBG: Recent Labs  Lab 07/10/21 0726 07/10/21 1139 07/10/21 1540 07/10/21 1958 07/11/21 0722  GLUCAP 240* 145* 147* 185* 144*   Lipid Profile: No results for input(s): CHOL, HDL, LDLCALC, TRIG, CHOLHDL, LDLDIRECT in the last 72 hours. Thyroid Function Tests: No results for input(s): TSH, T4TOTAL, FREET4, T3FREE, THYROIDAB in the last 72 hours. Anemia Panel: No results for input(s): VITAMINB12, FOLATE, FERRITIN, TIBC, IRON, RETICCTPCT in the last 72 hours. Sepsis Labs: No results for input(s): PROCALCITON, LATICACIDVEN in the last 168 hours.  No results found for this or any previous visit (from the past 240 hour(s)).       Radiology Studies: No results found.      Scheduled Meds:  acetaminophen  1,000 mg Oral TID   allopurinol  100 mg Oral Daily   amiodarone  200 mg Oral Daily   aspirin EC  81 mg Oral Daily   atorvastatin  40 mg Oral Daily   Chlorhexidine Gluconate Cloth  6 each Topical Q0600   docusate sodium  100 mg Oral Daily   epoetin (EPOGEN/PROCRIT) injection  10,000 Units Intravenous Q M,W,F-HD   heparin injection (subcutaneous)  5,000 Units Subcutaneous Q8H   insulin aspart  0-9 Units Subcutaneous TID AC & HS   levETIRAcetam  500 mg Oral BID   levETIRAcetam  500 mg Oral Once per day on Mon Wed Fri   midodrine  10 mg Oral TID AC   mirtazapine  7.5 mg Oral QHS   pantoprazole  40 mg Oral Daily   potassium chloride  20 mEq Oral Once   senna-docusate  1 tablet Oral QHS   sertraline  25 mg Oral QHS   sevelamer carbonate  800 mg Oral TID WC   Continuous Infusions:  magnesium sulfate bolus IVPB       LOS: 14 days    Time spent: 25 mins     Wyvonnia Dusky, MD [facility] Pager [phone]  If 7PM-7AM, please contact night-coverage 07/11/2021, 8:44 AM

## 2021-07-11 NOTE — Progress Notes (Signed)
Patient refused to take most of medications scheduled for 10:00 even with multiple staff members trying multiple times to explain how important his medications were (med pass included his keppra and amiodarone) but patient still refused. Dr. Jimmye Norman notified.

## 2021-07-11 NOTE — Progress Notes (Signed)
[facility]  PROGRESS NOTE   Subjective:   Patient seen and evaluated at bedside alert and oriented.  Complains of stomach upset this morning.  Unable to eat breakfast. States dressing on right stump is too tight.  Received dialysis yesterday, tolerated well  Objective:  Vital signs in last 24 hours:  Temp:  [97.6 F (36.4 C)-98.4 F (36.9 C)] 97.8 F (36.6 C) (09/27 0900) Pulse Rate:  [46-93] 65 (09/27 0900) Resp:  [9-24] 14 (09/27 0900) BP: (56-130)/(31-111) 108/76 (09/27 0900) SpO2:  [85 %-100 %] 97 % (09/27 0900) Weight:  [86 kg] 86 kg (09/27 0500)  Weight change: -0.183 kg Filed Weights   07/09/21 0326 07/10/21 0500 07/11/21 0500  Weight: 82.7 kg 86.2 kg 86 kg    Intake/Output: I/O last 3 completed shifts: In: 209.6 [I.V.:209.6] Out: -304    Intake/Output this shift:  No intake/output data recorded.  Physical Exam: General:  No acute distress  Head:  Normocephalic, atraumatic. Moist oral mucosal membranes  Eyes:  Anicteric  Lungs:   Clear to auscultation, normal effort, [place] O2  Heart:  S1S2 no rubs  Abdomen:   Soft, nontender, bowel sounds present  Extremities:  No peripheral edema.  Right AKA on 07/08/21  Neurologic:  Awake, alert, following commands  Skin:  No lesions  Access: Rt groin Permcath    Basic Metabolic Panel: Recent Labs  Lab 07/05/21 1442 07/08/21 0540 07/08/21 1813 07/09/21 0630 07/10/21 0516 07/11/21 0503  NA 136 138  --  135 135 135  K 3.1* 3.4*  --  4.8 4.8 4.0  CL 96* 95*  --  94* 98 98  CO2 27 30  --  20* 24 28  GLUCOSE 171* 64*  --  246* 263* 154*  BUN 25* 31*  --  47* 57* 32*  CREATININE 3.31* 4.39* 5.13* 6.03* 7.39* 4.79*  CALCIUM 8.7* 9.2  --  9.2 9.0 8.4*  MG  --  2.0  --  1.9 2.0 1.7  PHOS 3.2 4.3  --  6.8* 6.8* 4.3     CBC: Recent Labs  Lab 07/08/21 0540 07/08/21 1813 07/09/21 0630 07/10/21 0516 07/11/21 0503  WBC 14.1* 22.7* 23.0* 15.8* 10.2  HGB 8.6* 9.6* 9.0* 8.2* 7.9*  HCT 26.2* 29.9* 29.2*  25.3* 25.0*  MCV 87.3 87.4 88.5 87.8 86.5  PLT 232 335 285 258 216      Urinalysis: No results for input(s): COLORURINE, LABSPEC, PHURINE, GLUCOSEU, HGBUR, BILIRUBINUR, KETONESUR, PROTEINUR, UROBILINOGEN, NITRITE, LEUKOCYTESUR in the last 72 hours.  Invalid input(s): APPERANCEUR    Imaging: No results found.   Medications:    magnesium sulfate bolus IVPB      acetaminophen  1,000 mg Oral TID   allopurinol  100 mg Oral Daily   amiodarone  200 mg Oral Daily   aspirin EC  81 mg Oral Daily   atorvastatin  40 mg Oral Daily   Chlorhexidine Gluconate Cloth  6 each Topical Q0600   docusate sodium  100 mg Oral Daily   epoetin (EPOGEN/PROCRIT) injection  10,000 Units Intravenous Q M,W,F-HD   heparin injection (subcutaneous)  5,000 Units Subcutaneous Q8H   insulin aspart  0-9 Units Subcutaneous TID AC & HS   levETIRAcetam  500 mg Oral BID   levETIRAcetam  500 mg Oral Once per day on Mon Wed Fri   midodrine  10 mg Oral TID AC   mirtazapine  7.5 mg Oral QHS   pantoprazole  40 mg Oral Daily   potassium chloride  20 mEq Oral Once   senna-docusate  1 tablet Oral QHS   sertraline  25 mg Oral QHS   sevelamer carbonate  800 mg Oral TID WC    Assessment/ Plan:      Ernest [name] is a 75 year old male with history of hypertension, coronary artery disease, COPD, congestive heart failure, hepatitis C, hyperlipidemia, ESRD on hemodialysis Monday Wednesday Friday.  He is now being admitted with history of acute respiratory failure and also sepsis.  Principal Problem:   Wet gangrene ([place]) Active Problems:   Uncontrolled type 2 diabetes mellitus with end-stage renal disease ([place])   ESRD (end stage renal disease) ([place])   Septic shock (HCC)   PAD (peripheral artery disease) (HCC)   S/P AKA (above knee amputation), right ([place])  [facility] [address]/MWF/Rt groin Permcath   #1: ESRD: Received dialysis yesterday, low to no UF due to hypotension.  Next treatment scheduled for Wednesday    #2: Right lower extremity osteomyelitis: S/p right lower extremity angiogram.  S/p right AKA on 07/08/21.  PT/OT evaluation in progress  #3: Anemia with chronic kidney diease: Hgb 7.9  Epogen with HD treatments   #4: Secondary hyperparathyroidism: Calcium at goal and phosphorus elevated.  Sevelamer given with meals   #5: Hypotension: Soft BP, prescribed Midodrine. Transferred to stepdown unit postoperatively due to hypotension and lethargy. Was on Neo and given 571m fluid bolus with little resolve of symptoms.  Pressors discontinued on 07/10/21.  Pressures remain soft but stable.    LOS: 1[facility] 9/27/20229:34 AM

## 2021-07-12 DIAGNOSIS — I959 Hypotension, unspecified: Secondary | ICD-10-CM

## 2021-07-12 DIAGNOSIS — I5043 Acute on chronic combined systolic (congestive) and diastolic (congestive) heart failure: Secondary | ICD-10-CM

## 2021-07-12 DIAGNOSIS — Z1339 Encounter for screening examination for other mental health and behavioral disorders: Secondary | ICD-10-CM

## 2021-07-12 DIAGNOSIS — A419 Sepsis, unspecified organism: Secondary | ICD-10-CM | POA: Diagnosis present

## 2021-07-12 DIAGNOSIS — R410 Disorientation, unspecified: Secondary | ICD-10-CM

## 2021-07-12 DIAGNOSIS — E1165 Type 2 diabetes mellitus with hyperglycemia: Secondary | ICD-10-CM

## 2021-07-12 LAB — CBC
HCT: 24.8 % — ABNORMAL LOW (ref 39.0–52.0)
Hemoglobin: 8.2 g/dL — ABNORMAL LOW (ref 13.0–17.0)
MCH: 28.6 pg (ref 26.0–34.0)
MCHC: 33.1 g/dL (ref 30.0–36.0)
MCV: 86.4 fL (ref 80.0–100.0)
Platelets: 258 10*3/uL (ref 150–400)
RBC: 2.87 MIL/uL — ABNORMAL LOW (ref 4.22–5.81)
RDW: 17 % — ABNORMAL HIGH (ref 11.5–15.5)
WBC: 11.6 10*3/uL — ABNORMAL HIGH (ref 4.0–10.5)
nRBC: 0 % (ref 0.0–0.2)

## 2021-07-12 LAB — BASIC METABOLIC PANEL
Anion gap: 16 — ABNORMAL HIGH (ref 5–15)
BUN: 44 mg/dL — ABNORMAL HIGH (ref 8–23)
CO2: 24 mmol/L (ref 22–32)
Calcium: 8.7 mg/dL — ABNORMAL LOW (ref 8.9–10.3)
Chloride: 99 mmol/L (ref 98–111)
Creatinine, Ser: 6.42 mg/dL — ABNORMAL HIGH (ref 0.61–1.24)
GFR, Estimated: 9 mL/min — ABNORMAL LOW (ref >60)
Glucose, Bld: 125 mg/dL — ABNORMAL HIGH (ref 70–99)
Potassium: 4.4 mmol/L (ref 3.5–5.1)
Sodium: 139 mmol/L (ref 135–145)

## 2021-07-12 LAB — MAGNESIUM: Magnesium: 1.8 mg/dL (ref 1.7–2.4)

## 2021-07-12 LAB — GLUCOSE, CAPILLARY
Glucose-Capillary: 110 mg/dL — ABNORMAL HIGH (ref 70–99)
Glucose-Capillary: 132 mg/dL — ABNORMAL HIGH (ref 70–99)
Glucose-Capillary: 132 mg/dL — ABNORMAL HIGH (ref 70–99)
Glucose-Capillary: 177 mg/dL — ABNORMAL HIGH (ref 70–99)

## 2021-07-12 LAB — PHOSPHORUS: Phosphorus: 5.2 mg/dL — ABNORMAL HIGH (ref 2.5–4.6)

## 2021-07-12 MED ORDER — MIRTAZAPINE 15 MG PO TABS
15.0000 mg | ORAL_TABLET | Freq: Every day | ORAL | Status: DC
  Administered 2021-07-12 – 2021-07-19 (×8): 15 mg via ORAL
  Filled 2021-07-12 (×8): qty 1

## 2021-07-12 MED ORDER — MIDODRINE HCL 5 MG PO TABS
10.0000 mg | ORAL_TABLET | Freq: Four times a day (QID) | ORAL | Status: DC
  Administered 2021-07-12 – 2021-07-20 (×32): 10 mg via ORAL
  Filled 2021-07-12 (×33): qty 2

## 2021-07-12 NOTE — Progress Notes (Signed)
Patient seen by Dr. Leslye Peer and NP Gwyneth Revels this morning with this RN, at which point patient refused dialysis today. Later this morning, patient seen again by NP Breeze with Dr. Holley Raring and this RN, where patient again refused dialysis today. When asked if he was going to go to dialysis as an outpatient, patient stated he did not want any more dialysis.  This RN asked the patient if he understood the outcome of quitting dialysis, and patient responded "yes." Patient then stated he wanted to go "home" to [facility]. Writer again asked "do you understand what will happen if you quit dialysis?" Patient responded "I'll go to sleep" then clarified "forever." Dr. Holley Raring at bedside for this conversation. Writer informed Dr. Leslye Peer of patient's decision. Called daughter [name] at approximately 10:30, who spoke with patient and told him she will be in shortly. Dialysis cancelled for today, palliative re-consulted.

## 2021-07-12 NOTE — Progress Notes (Signed)
Patient ID: Ernest [name], male   DOB: Aug 02, 1946, 75 y.o.   MRN: [id number] [facility] PROGRESS NOTE  Ernest CLAXTON [id number] DOB: Aug 11, 1946 DOA: 06/26/2021 PCP: Patient, [name] (Inactive)  HPI/Subjective: Patient answers some yes or no questions.  States that he does not want dialysis.  Recent right AKA.  Patient just wants to get back to his facility.  Admitted 15 days ago with right heel gangrene.  Objective: Vitals:   07/12/21 0744 07/12/21 0802  BP:  (!) 82/61  Pulse: 65 70  Resp: 15   Temp:    SpO2: 95% 98%    Intake/Output Summary (Last 24 hours) at 07/12/2021 1234 Last data filed at 07/12/2021 0900 Gross per 24 hour  Intake 120 ml  Output --  Net 120 ml   Filed Weights   07/10/21 0500 07/11/21 0500 07/12/21 0500  Weight: 86.2 kg 86 kg 86.6 kg    ROS: Review of Systems  Respiratory:  Negative for shortness of breath.   Cardiovascular:  Negative for chest pain.  Gastrointestinal:  Negative for abdominal pain, nausea and vomiting.  Exam: Physical Exam HENT:     Head: Normocephalic.     Mouth/Throat:     Pharynx: No oropharyngeal exudate.  Eyes:     General: Lids are normal.     Conjunctiva/sclera: Conjunctivae normal.     Pupils: Pupils are equal, round, and reactive to light.  Cardiovascular:     Rate and Rhythm: Normal rate and regular rhythm.     Heart sounds: Normal heart sounds, S1 normal and S2 normal.  Pulmonary:     Breath sounds: Normal breath sounds. No decreased breath sounds, wheezing, rhonchi or rales.  Abdominal:     Palpations: Abdomen is soft.     Tenderness: There is no abdominal tenderness.  Musculoskeletal:     Left lower leg: Swelling present.  Skin:    General: Skin is warm.     Findings: No rash.  Neurological:     Mental Status: He is alert.     Comments: Answer some yes/no questions.      Scheduled Meds:  acetaminophen  1,000 mg Oral TID   allopurinol  100 mg Oral Daily   amiodarone  200 mg Oral Daily    aspirin EC  81 mg Oral Daily   atorvastatin  40 mg Oral Daily   Chlorhexidine Gluconate Cloth  6 each Topical Q0600   docusate sodium  100 mg Oral Daily   epoetin (EPOGEN/PROCRIT) injection  10,000 Units Intravenous Q M,W,F-HD   heparin injection (subcutaneous)  5,000 Units Subcutaneous Q8H   insulin aspart  0-9 Units Subcutaneous TID AC & HS   levETIRAcetam  500 mg Oral BID   levETIRAcetam  500 mg Oral Once per day on Mon Wed Fri   midodrine  10 mg Oral TID AC   mirtazapine  7.5 mg Oral QHS   pantoprazole  40 mg Oral Daily   potassium chloride  20 mEq Oral Once   senna-docusate  1 tablet Oral QHS   sertraline  25 mg Oral QHS   sevelamer carbonate  800 mg Oral TID WC   Continuous Infusions:  magnesium sulfate bolus IVPB      Assessment/Plan:  Postoperative hypotension.  Off pressors.  On midodrine.  Last blood pressure on the lower side.  Increase midodrine to 4 times a day dosing. Possible acute delirium.  Patient refusing dialysis.  Nephrology discussed consequences of refusing dialysis including death.  Patient's daughter  concerned that he does not understand any of these consequences.  We will get a psychiatric consultation to determine capacity.  We will also get palliative care consultation. Sepsis with gangrene of the right heel.  Patient with right AKA on 07/08/2021.  Off antibiotics at this point. End-stage renal disease.  Patient refusing hemodialysis at this point. Anemia of chronic disease.  Last hemoglobin 8.2. Chronic combined systolic and diastolic congestive heart failure.  Dialysis to manage fluid Type 2 diabetes mellitus with hyperglycemia.  On sliding scale insulin.  Last hemoglobin A1c 8.4. Peripheral vascular disease on aspirin and statin History of CAD on aspirin and statin History of seizure on Keppra History of arrhythmia on amiodarone     Code Status:     Code Status Orders  (From admission, onward)           Start     Ordered   07/08/21 1626   Full code  Continuous        07/08/21 1625           Code Status History     Date Active Date Inactive Code Status Order ID Comments User Context   06/27/2021 0250 07/08/2021 1456 Full Code [id number]  Christel Mormon, MD ED   09/30/2019 1657 09/30/2019 2058 Full Code [id number]  Ina Homes, MD ED   09/08/2017 0359 10/07/2017 2150 Full Code [id number]  Collier Salina, MD ED   10/27/2016 2044 10/29/2016 2121 Full Code [id number]  Zada Finders, MD Inpatient   07/13/2015 1652 07/21/2015 1336 Full Code [id number]  Cathlyn Parsons, [name] Inpatient   07/11/2015 1629 07/13/2015 1652 Full Code [id number]  Juluis Mire, MD ED   04/29/2015 1352 04/30/2015 2038 Full Code [id number]  Lucious Groves, DO Inpatient   08/27/2013 0427 08/27/2013 1845 Full Code [id number]  Mal Misty, MD Inpatient      Family Communication: Spoke with daughter on the phone Disposition Plan: Status is: Inpatient  Dispo: The patient is from: Rehab              Anticipated d/c is to: Rehab              Patient currently refusing dialysis.  We will get a psychiatric consultation to determine if he has the capacity to make that decision or not.  Spoke with the patient's daughter to talk to them about things.  Also palliative care consultation.   Difficult to place patient.  No.  Consultants: Psychiatry Palliative care Nephrology Vascular surgery  Procedures: Right AKA on 07/08/2021  Time spent: 29 minutes, case discussed with nephrology and psychiatry  Loletha Grayer  [facility]

## 2021-07-12 NOTE — Progress Notes (Signed)
Occupational Therapy Treatment Patient Details Name: Ernest [name] MRN: [id number] DOB: [birth year] Today's Date: 07/12/2021   History of present illness Pt is a 75 y/o M [facility]/21 from [facility] with c/o fever & chills. Pt found to have R heel decubitus ulcer & developed wet gangrene. Pt underwent R AKA on 07/08/21, prompting re-evaluation. PMH: ESRD On HD MWF, chronic systolic CHF, COPD, CAD, DVT, GERD, gout, Hep C, HTN, dyslipidemia, CVA   OT comments  Pt seen for OT tx this date to f/u re: safety with ADLs/ADL mobility. Pt requires gentle encouragement to participate in OT Session. OT engages pt in oral care while positioned in high fowler's with MIN/MOD A (hand over hand to initiate task). Cues for sequencing throughout. OT then engages pt in below-listed UB exercises. Pt left with all needs met and in reach. RN notified of session. Will continue to follow.   Recommendations for follow up therapy are one component of a multi-disciplinary discharge planning process, led by the attending physician.  Recommendations may be updated based on patient status, additional functional criteria and insurance authorization.    Follow Up Recommendations  SNF    Equipment Recommendations  Other (comment) (defer)    Recommendations for Other Services      Precautions / Restrictions Precautions Precautions: Fall Restrictions Weight Bearing Restrictions: Yes RLE Weight Bearing: Non weight bearing       Mobility Bed Mobility                    Transfers                      Balance                                           ADL either performed or assessed with clinical judgement   ADL Overall ADL's : Needs assistance/impaired     Grooming: Oral care;Minimal assistance;Moderate assistance;Bed level Grooming Details (indicate cue type and reason): high fowler's position, cues to initiate and sequence, some hand over hand for thorough  completion. Modeling/visual cues.                                     Vision Patient Visual Report: No change from baseline     Perception     Praxis      Cognition Arousal/Alertness: Awake/alert Behavior During Therapy: WFL for tasks assessed/performed Overall Cognitive Status: No family/caregiver present to determine baseline cognitive functioning                                 General Comments: pt speaks in some full sentences, but mostly uses 1-3 word phrases to communicate and perseverates on wanting to go home. He is mostly appropriate with command following, but does require increased processing time.        Exercises Other Exercises Other Exercises: OT engages pt in bed level UB ADLs including straight arm raises for 1 set of 10 per side and grasp squeezes with rolled gauze for light resistance for 10 reps. Pt requires moderate verbal/tactile/visual cues for form, pace, technique.   Shoulder Instructions       General Comments      Pertinent  Vitals/ Pain       Pain Assessment: Faces Faces Pain Scale: Hurts a little bit Pain Location: RLE Pain Descriptors / Indicators: Discomfort Pain Intervention(s): Monitored during session  Home Living                                          Prior Functioning/Environment              Frequency  Min 1X/week        Progress Toward Goals  OT Goals(current goals can now be found in the care plan section)  Progress towards OT goals: Progressing toward goals  Acute Rehab OT Goals Patient Stated Goal: none stated OT Goal Formulation: With patient Time For Goal Achievement: 07/24/21 Potential to Achieve Goals: Good  Plan Discharge plan remains appropriate;Frequency remains appropriate    Co-evaluation                 AM-PAC OT "6 Clicks" Daily Activity     Outcome Measure   Help from another person eating meals?: A Lot Help from another person taking  care of personal grooming?: A Lot Help from another person toileting, which includes using toliet, bedpan, or urinal?: Total Help from another person bathing (including washing, rinsing, drying)?: Total Help from another person to put on and taking off regular upper body clothing?: A Lot Help from another person to put on and taking off regular lower body clothing?: A Lot 6 Click Score: 10    End of Session Equipment Utilized During Treatment: Gait belt;Rolling walker  OT Visit Diagnosis: Unsteadiness on feet (R26.81);Muscle weakness (generalized) (M62.81)   Activity Tolerance Patient tolerated treatment well   Patient Left in bed;with call bell/phone within reach;with bed alarm set   Nurse Communication Mobility status        Time: 9163-8466 OT Time Calculation (min): 14 min  Charges: OT General Charges $OT Visit: 1 Visit OT Treatments $Therapeutic Exercise: 8-22 mins  Gerrianne Scale, Wood, OTR/L ascom [phone] 07/12/21, 5:16 PM

## 2021-07-12 NOTE — Consult Note (Signed)
[facility] Consult   Reason for Consult: Consult to consider decision making capacity for refusing dialysis Referring Physician:  [place] Patient Identification: Ernest [name] MRN:  [id number] Principal Diagnosis: ESRD (end stage renal disease) ([place]) Diagnosis:  Principal Problem:   ESRD (end stage renal disease) ([place]) Active Problems:   Type 2 diabetes mellitus with hyperglycemia, without long-term current use of insulin ([place])   Acute on chronic combined systolic and diastolic CHF (congestive heart failure) ([place])   Anemia of chronic disease   Sepsis without acute organ dysfunction ([place])   PAD (peripheral artery disease) ([place])   Wet gangrene ([place])   S/P AKA (above knee amputation), right ([place])   Hypotension   Acute delirium   Total Time spent with patient: 1 hour  Subjective:   Ernest [name] is a 75 y.o. male patient admitted with "I want to go home".  HPI: Patient seen chart reviewed.  76 year old man with diabetes and end-stage renal disease who has been on hemodialysis for years.  Admitted to the hospital with a ulcer infection.  Now declining to get dialysis.  Question arose for whether he has capacity to make this decision.  Patient was awake when I came to see him.  I introduced myself and asked if he was able to have a conversation and he said he was.  Patient was able to tell me his name and was able to tell me where he was.  Able to tell me the correct year.  He was able to tell me why he got admitted to the hospital.  I ask him if he could tell me about his underlying medical problems and he immediately mentioned that he was on dialysis.  He said he had been receiving it for many years and that he goes Monday Wednesday and Friday.  He was not able to give a clear description of the mechanics of dialysis but was aware that he had to go somewhere to have it done and also was aware that it was a result of his kidneys not working.  He stated that he found dialysis  uncomfortable.  Patient was able to tell me on his own that he knew that if he permanently stopped getting dialysis it would eventually cause him to die.  I explained to him that this was true although the process could take anywhere from days to weeks and he could become quite uncomfortable in the meantime.  Patient denied feeling depressed.  He said he still felt that he had a positive relationship with his children.  He said he was still communicating with them.  He did not have any active wish to kill himself but said that he was tired and that it was no longer worth it to him to get dialysis even though he knew he would die of kidney failure.  Patient repeated several times that he wanted to go home.  I asked him whether if he went home he would then continue to get dialysis as an outpatient.  He said that he would not.  Past Psychiatric History: Patient says that he had been diagnosed with depression about 6 months ago and had been given medicine.  Chart shows that he is receiving Remeron very low-dose 7.5 mg at night.  Denies any history of suicide attempts or hospitalizations.  No psychiatric notes in the chart. Risk to Self:   Risk to Others:   Prior Inpatient Therapy:   Prior Outpatient Therapy:    Past Medical History:  Past Medical History:  Diagnosis Date   Abnormal stress test    s/p cath November 2013 with modest disease involving the ostial left main, proximal LAD, proximal RCA - do not appear to be hemodynamically signficant; mild LV dysfunction   Anemia    low iron   Anxiety    Arthritis    Bacteremia    Chronic systolic CHF (congestive heart failure) (HCC)    COPD (chronic obstructive pulmonary disease) ([place])    Coronary artery disease    a. per cath report 2013, b. 09/13/2017-CABG X4 & Mitral valve repair   Depression    DVT (deep venous thrombosis) (HCC)    arm - right- finished blood thinner- not sure when it   Ejection fraction < 50%    Erectile dysfunction    penile  implant   ESRD (end stage renal disease) ([place])    [facility] on a MWF schedule.      Gout    Hx: of   Hepatitis C    History of seizures    last one 2018   Hyperlipidemia    Hypertension    Mitral valve disease    s/p Mitral repair 09/13/17   Obesity    Retinopathy    Shortness of breath    Hx: of with exertion   Stroke [facility])    "several" left side weakness, speech affected   Type II or unspecified type diabetes mellitus without mention of complication, not stated as uncontrolled    adult onset    Past Surgical History:  Procedure Laterality Date   A/V FISTULAGRAM N/A 10/04/2017   Procedure: A/V FISTULAGRAM;  Surgeon: Elam Dutch, MD;  Location: [place] [facility];  Service: Cardiovascular;  Laterality: N/A;   A/V SHUNTOGRAM Left 07/12/2020   Procedure: A/V SHUNTOGRAM;  Surgeon: Serafina Mitchell, MD;  Location: [place] [facility];  Service: Cardiovascular;  Laterality: Left;   AMPUTATION Right 07/08/2021   Procedure: AMPUTATION ABOVE KNEE;  Surgeon: Elmore Guise, MD;  Location: [facility];  Service: Vascular;  Laterality: Right;   ANGIOPLASTY Left 09/24/2019   Procedure: Angioplasty Innominate Vien;  Surgeon: Serafina Mitchell, MD;  Location: [place];  Service: Vascular;  Laterality: Left;   AV FISTULA PLACEMENT Left 01/13/2013   Procedure: INSERTION OF ARTERIOVENOUS (AV) GORE-TEX GRAFT ARM;  Surgeon: Angelia Mould, MD;  Location: [place];  Service: Vascular;  Laterality: Left;   AV FISTULA PLACEMENT Left 05/05/2013   Procedure: INSERTION OF LEFT UPPER ARM  ARTERIOVENOUS GORTEX GRAFT;  Surgeon: Angelia Mould, MD;  Location: [place];  Service: Vascular;  Laterality: Left;   AV FISTULA PLACEMENT Left 11/26/2019   Procedure: INSERTION OF ARTERIOVENOUS (AV) GORE-TEX GRAFT, left  ARM;  Surgeon: Serafina Mitchell, MD;  Location: [place];  Service: Vascular;  Laterality: Left;   [place] REMOVAL Left 03/26/2013   Procedure: REMOVAL OF  NON INCORPORATED ARTERIOVENOUS GORETEX GRAFT  ([place]) left arm * repair of  left brachial artery with vein patch angioplasty.;  Surgeon: Angelia Mould, MD;  Location: [place];  Service: Vascular;  Laterality: Left;   CARDIAC CATHETERIZATION  August 26, 2012   CATARACT EXTRACTION W/ INTRAOCULAR LENS  IMPLANT, BILATERAL     COLONOSCOPY     CORONARY ARTERY BYPASS GRAFT N/A 09/13/2017   Procedure: CORONARY ARTERY BYPASS GRAFTING (CABG) times four LIMA  to LAD, SVG sequentially to OM and RAMUS Intermediate and SVG to Acute Marginal;  Surgeon: Melrose Nakayama, MD;  [facility]  OR;  Service: Open Heart Surgery;  Laterality: N/A;   DIALYSIS/PERMA CATHETER INSERTION N/A 02/28/2021   Procedure: DIALYSIS/PERMA CATHETER EXCHANGE;  Surgeon: Katha Cabal, MD;  Location: [place] [facility];  Service: Cardiovascular;  Laterality: N/A;   EMBOLECTOMY Right 08/27/2013   Procedure: EMBOLECTOMY;  Surgeon: Mal Misty, MD;  Location: [place];  Service: Vascular;  Laterality: Right;  Thrombectomy of Radial and ulnar artery.   ENDOVASCULAR STENT INSERTION Left 09/24/2019   Procedure: Endovascular Stent Graft Insertion, Arm Graft;  Surgeon: Serafina Mitchell, MD;  Location: [facility] OR;  Service: Vascular;  Laterality: Left;   EYE SURGERY     laser.  and surgery for DM   fistula     RUE and wrist   FISTULOGRAM Left 09/24/2019   Procedure: SHUNTOGRAM OF ARTERIOVENOUS FISTULA LEFT ARM,;  Surgeon: Serafina Mitchell, MD;  Location: [place];  Service: Vascular;  Laterality: Left;   INSERTION OF DIALYSIS CATHETER Right 01/01/2013   Procedure: INSERTION OF DIALYSIS CATHETER right  internal jugular;  Surgeon: Serafina Mitchell, MD;  Location: [place];  Service: Vascular;  Laterality: Right;   INSERTION OF DIALYSIS CATHETER N/A 03/26/2013   Procedure: INSERTION OF DIALYSIS CATHETER Left internal jugular vein;  Surgeon: Angelia Mould, MD;  Location: [place];  Service: Vascular;  Laterality: N/A;   INSERTION OF DIALYSIS CATHETER Right 11/26/2019   Procedure:  INSERTION OF DIALYSIS CATHETER, right internal jugular;  Surgeon: Serafina Mitchell, MD;  Location: [place];  Service: Vascular;  Laterality: Right;   IR THORACENTESIS ASP PLEURAL SPACE W/IMG GUIDE  09/30/2019   LEFT HEART CATH AND CORONARY ANGIOGRAPHY N/A 09/09/2017   Procedure: LEFT HEART CATH AND CORONARY ANGIOGRAPHY;  Surgeon: Troy Sine, MD;  Location: [place] [facility];  Service: Cardiovascular;  Laterality: N/A;   LIGATION OF ARTERIOVENOUS  FISTULA Right 12/30/2013   Procedure: REMOVAL OF SEGMENT OF GORTEX GRAFT AND FISTULA  AND REPAIR OF BRACHIAL ARTERY;  Surgeon: Mal Misty, MD;  Location: [place];  Service: Vascular;  Laterality: Right;   LOWER EXTREMITY ANGIOGRAPHY N/A 06/28/2021   Procedure: Lower Extremity Angiography;  Surgeon: Katha Cabal, MD;  Location: [place] [facility];  Service: Cardiovascular;  Laterality: N/A;   MITRAL VALVE REPAIR N/A 09/13/2017   Procedure: MITRAL VALVE REPAIR (MVR);  Surgeon: Melrose Nakayama, MD;  Location: [place];  Service: Open Heart Surgery;  Laterality: N/A;   MITRAL VALVE REPAIR (MV)/CORONARY ARTERY BYPASS GRAFTING (CABG)  09/13/2017   PENILE PROSTHESIS IMPLANT     1997.. no card   PERIPHERAL VASCULAR BALLOON ANGIOPLASTY Left 10/04/2017   Procedure: PERIPHERAL VASCULAR BALLOON ANGIOPLASTY;  Surgeon: Elam Dutch, MD;  Location: [place] [facility];  Service: Cardiovascular;  Laterality: Left;  AVF   PERIPHERAL VASCULAR BALLOON ANGIOPLASTY Left 07/12/2020   Procedure: PERIPHERAL VASCULAR BALLOON ANGIOPLASTY;  Surgeon: Serafina Mitchell, MD;  Location: [place] [facility];  Service: Cardiovascular;  Laterality: Left;  ARM SHUNTOGRAM   REVISION OF ARTERIOVENOUS GORETEX GRAFT Left 09/24/2019   Procedure: REVISION OF ARTERIOVENOUS GORETEX GRAFT LEFT ARM;  Surgeon: Serafina Mitchell, MD;  Location[facility];  Service: Vascular;  Laterality: Left;   TEE WITHOUT CARDIOVERSION N/A 06/11/2013   Procedure: TRANSESOPHAGEAL ECHOCARDIOGRAM (TEE);   Surgeon: Larey Dresser, MD;  Location: [facility] ENDOSCOPY;  Service: Cardiovascular;  Laterality: N/A;   TEE WITHOUT CARDIOVERSION N/A 09/13/2017   Procedure: TRANSESOPHAGEAL ECHOCARDIOGRAM (TEE);  Surgeon: Melrose Nakayama, MD;  Location: [place];  Service: Open Heart Surgery;  Laterality: N/A;   THROMBECTOMY W/ EMBOLECTOMY Left 03/26/2013   Procedure: Attempted thrombectomy of left arm arteriovenous goretex graft.;  Surgeon: Angelia Mould, MD;  Location: [facility] OR;  Service: Vascular;  Laterality: Left;   ULTRASOUND GUIDANCE FOR VASCULAR ACCESS Bilateral 09/24/2019   Procedure: Ultrasound Guidance For Vascular Access, Right Femoral Vein and Left Arm Graft;  Surgeon: Serafina Mitchell, MD;  Location: [facility] OR;  Service: Vascular;  Laterality: Bilateral;   VENOGRAM N/A 04/07/2013   Procedure: VENOGRAM;  Surgeon: Serafina Mitchell, MD;  Location: [facility] CATH LAB;  Service: Cardiovascular;  Laterality: N/A;   Family History:  Family History  Problem Relation Age of Onset   Heart disease Father    Diabetes Sister    Diabetes Brother    Diabetes Son    Family Psychiatric  History: Denies knowing of any Social History:  Social History   Substance and Sexual Activity  Alcohol Use No   Alcohol/week: 0.0 standard drinks     Social History   Substance and Sexual Activity  Drug Use No    Social History   Socioeconomic History   Marital status: Married    Spouse name: Not on file   Number of children: Not on file   Years of education: Not on file   Highest education level: Not on file  Occupational History   Occupation: Cabin crew: DISABLED    Comment: disabled  Tobacco Use   Smoking status: Former    Years: 7.00    Types: Cigarettes    Quit date: 06/10/1973    Years since quitting: 48.1   Smokeless tobacco: Never   Tobacco comments:    Quit in 1974.  Vaping Use   Vaping Use: Never used  Substance and Sexual Activity   Alcohol use: No    Alcohol/week: 0.0 standard  drinks   Drug use: No   Sexual activity: Not on file  Other Topics Concern   Not on file  Social History Narrative   Adopted mentally retarded child at home, 2 adopted children, 3 living and 2 deceased.   Social Determinants of Health   Financial Resource Strain: Not on file  Food Insecurity: Not on file  Transportation Needs: Not on file  Physical Activity: Not on file  Stress: Not on file  Social Connections: Not on file   Additional Social History:    Allergies:  [name]  Labs:  Results for orders placed or performed during the hospital encounter of 06/26/21 (from the past 48 hour(s))  Glucose, capillary     Status: Abnormal   Collection Time: 07/10/21  7:58 PM  Result Value Ref Range   Glucose-Capillary 185 (H) 70 - 99 mg/dL    Comment: Glucose reference range applies only to samples taken after fasting for at least 8 hours.  CBC     Status: Abnormal   Collection Time: 07/11/21  5:03 AM  Result Value Ref Range   WBC 10.2 4.0 - 10.5 K/uL   RBC 2.89 (L) 4.22 - 5.81 MIL/uL   Hemoglobin 7.9 (L) 13.0 - 17.0 g/dL   HCT 25.0 (L) 39.0 - 52.0 %   MCV 86.5 80.0 - 100.0 fL   MCH 27.3 26.0 - 34.0 pg   MCHC 31.6 30.0 - 36.0 g/dL   RDW 17.2 (H) 11.5 - 15.5 %   Platelets 216 150 - 400 K/uL   nRBC 0.0 0.0 - 0.2 %  Comment: Performed at [facility], [address] [place], [place] [id number]  Basic metabolic panel     Status: Abnormal   Collection Time: 07/11/21  5:03 AM  Result Value Ref Range   Sodium 135 135 - 145 mmol/L   Potassium 4.0 3.5 - 5.1 mmol/L   Chloride 98 98 - 111 mmol/L   CO2 28 22 - 32 mmol/L   Glucose, Bld 154 (H) 70 - 99 mg/dL    Comment: Glucose reference range applies only to samples taken after fasting for at least 8 hours.   BUN 32 (H) 8 - 23 mg/dL   Creatinine, Ser 4.79 (H) 0.61 - 1.24 mg/dL   Calcium 8.4 (L) 8.9 - 10.3 mg/dL   GFR, Estimated 12 (L) >60 mL/min    Comment: (NOTE) Calculated using the CKD-EPI Creatinine  Equation (2021)    Anion gap 9 5 - 15    Comment: Performed at [facility], [address] [place], [place] [address]  Magnesium     Status: None   Collection Time: 07/11/21  5:03 AM  Result Value Ref Range   Magnesium 1.7 1.7 - 2.4 mg/dL    Comment: Performed at [facility], [address] [place], [place] [address]  Phosphorus     Status: None   Collection Time: 07/11/21  5:03 AM  Result Value Ref Range   Phosphorus 4.3 2.5 - 4.6 mg/dL    Comment: Performed at [facility], [address] [place], [place] [address]  Glucose, capillary     Status: Abnormal   Collection Time: 07/11/21  7:22 AM  Result Value Ref Range   Glucose-Capillary 144 (H) 70 - 99 mg/dL    Comment: Glucose reference range applies only to samples taken after fasting for at least 8 hours.  Glucose, capillary     Status: Abnormal   Collection Time: 07/11/21 11:45 AM  Result Value Ref Range   Glucose-Capillary 137 (H) 70 - 99 mg/dL    Comment: Glucose reference range applies only to samples taken after fasting for at least 8 hours.  Glucose, capillary     Status: Abnormal   Collection Time: 07/11/21  4:04 PM  Result Value Ref Range   Glucose-Capillary 117 (H) 70 - 99 mg/dL    Comment: Glucose reference range applies only to samples taken after fasting for at least 8 hours.  Glucose, capillary     Status: Abnormal   Collection Time: 07/11/21  7:39 PM  Result Value Ref Range   Glucose-Capillary 140 (H) 70 - 99 mg/dL    Comment: Glucose reference range applies only to samples taken after fasting for at least 8 hours.  CBC     Status: Abnormal   Collection Time: 07/12/21  5:20 AM  Result Value Ref Range   WBC 11.6 (H) 4.0 - 10.5 K/uL   RBC 2.87 (L) 4.22 - 5.81 MIL/uL   Hemoglobin 8.2 (L) 13.0 - 17.0 g/dL   HCT 24.8 (L) 39.0 - 52.0 %   MCV 86.4 80.0 - 100.0 fL   MCH 28.6 26.0 - 34.0 pg   MCHC 33.1 30.0 - 36.0 g/dL   RDW 17.0 (H) 11.5 - 15.5 %   Platelets 258 150 - 400 K/uL    nRBC 0.0 0.0 - 0.2 %    Comment: Performed at [facility], [address] [place], [place] [id number]  Basic metabolic panel     Status: Abnormal   Collection Time: 07/12/21  5:20  AM  Result Value Ref Range   Sodium 139 135 - 145 mmol/L   Potassium 4.4 3.5 - 5.1 mmol/L   Chloride 99 98 - 111 mmol/L   CO2 24 22 - 32 mmol/L   Glucose, Bld 125 (H) 70 - 99 mg/dL    Comment: Glucose reference range applies only to samples taken after fasting for at least 8 hours.   BUN 44 (H) 8 - 23 mg/dL   Creatinine, Ser 6.42 (H) 0.61 - 1.24 mg/dL   Calcium 8.7 (L) 8.9 - 10.3 mg/dL   GFR, Estimated 9 (L) >60 mL/min    Comment: (NOTE) Calculated using the CKD-EPI Creatinine Equation (2021)    Anion gap 16 (H) 5 - 15    Comment: Performed at [facility], [address] [place], [place] [address]  Magnesium     Status: None   Collection Time: 07/12/21  5:20 AM  Result Value Ref Range   Magnesium 1.8 1.7 - 2.4 mg/dL    Comment: Performed at [facility], [address], [place], [place] [address]  Phosphorus     Status: Abnormal   Collection Time: 07/12/21  5:20 AM  Result Value Ref Range   Phosphorus 5.2 (H) 2.5 - 4.6 mg/dL    Comment: Performed at [facility], [address] [place], [place] [address]  Glucose, capillary     Status: Abnormal   Collection Time: 07/12/21  8:09 AM  Result Value Ref Range   Glucose-Capillary 110 (H) 70 - 99 mg/dL    Comment: Glucose reference range applies only to samples taken after fasting for at least 8 hours.  Glucose, capillary     Status: Abnormal   Collection Time: 07/12/21 11:14 AM  Result Value Ref Range   Glucose-Capillary 132 (H) 70 - 99 mg/dL    Comment: Glucose reference range applies only to samples taken after fasting for at least 8 hours.  Glucose, capillary     Status: Abnormal   Collection Time: 07/12/21  4:34 PM  Result Value Ref Range   Glucose-Capillary 132 (H) 70 - 99 mg/dL    Comment: Glucose  reference range applies only to samples taken after fasting for at least 8 hours.    Current Facility-Administered Medications  Medication Dose Route Frequency Provider Last Rate Last Admin   acetaminophen (TYLENOL) tablet 1,000 mg  1,000 mg Oral TID Philis Pique, NP   1,000 mg at 07/11/21 2206   allopurinol (ZYLOPRIM) tablet 100 mg  100 mg Oral Daily Schnier, Dolores Lory, MD   100 mg at 07/09/21 0914   alum & mag hydroxide-simeth (MAALOX/MYLANTA) 200-200-20 MG/5ML suspension 15-30 mL  15-30 mL Oral Q2H PRN Elmore Guise, MD       amiodarone (PACERONE) tablet 200 mg  200 mg Oral Daily Schnier, Dolores Lory, MD   200 mg at 07/10/21 1405   aspirin EC tablet 81 mg  81 mg Oral Daily Schnier, Dolores Lory, MD   81 mg at 07/11/21 0950   atorvastatin (LIPITOR) tablet 40 mg  40 mg Oral Daily Schnier, Dolores Lory, MD   40 mg at 07/10/21 1404   Chlorhexidine Gluconate Cloth 2 % PADS 6 each  6 each Topical Q0600 Elmore Guise, MD   6 each at 07/12/21 [id number]   docusate sodium (COLACE) capsule 100 mg  100 mg Oral Daily Schnier, Dolores Lory, MD   100 mg at 07/11/21 0949   epoetin alfa (EPOGEN) injection 10,000 Units  10,000 Units Intravenous  Q M,W,F-HD Kolluru, Sarath, MD   10,000 Units at 07/10/21 1145   feeding supplement (NEPRO CARB STEADY) liquid 237 mL  237 mL Oral Daily PRN Schnier, Dolores Lory, MD       guaiFENesin-dextromethorphan (ROBITUSSIN DM) 100-10 MG/5ML syrup 15 mL  15 mL Oral Q4H PRN Elmore Guise, MD   15 mL at 07/09/21 2352   heparin injection 5,000 Units  5,000 Units Subcutaneous Q8H Schnier, Dolores Lory, MD   5,000 Units at 07/12/21 1310   hydrALAZINE (APRESOLINE) injection 5 mg  5 mg Intravenous Q20 Min PRN Elmore Guise, MD       insulin aspart (novoLOG) injection 0-9 Units  0-9 Units Subcutaneous TID AC & HS Rust-Chester, Britton L, NP   1 Units at 07/11/21 2205   labetalol (NORMODYNE) injection 10 mg  10 mg Intravenous Q10 min PRN Elmore Guise, MD       levETIRAcetam (KEPPRA) tablet 500  mg  500 mg Oral BID Delana Meyer Dolores Lory, MD   500 mg at 07/12/21 [id number]   levETIRAcetam (KEPPRA) tablet 500 mg  500 mg Oral Once per day on Mon Wed Fri Schnier, Dolores Lory, MD   500 mg at 07/10/21 2130   magnesium sulfate IVPB 2 g 50 mL  2 g Intravenous Daily PRN Elmore Guise, MD       midodrine (PROAMATINE) tablet 10 mg  10 mg Oral QID Loletha Grayer, MD   10 mg at 07/12/21 1310   mirtazapine (REMERON) tablet 7.5 mg  7.5 mg Oral QHS Schnier, Dolores Lory, MD   7.5 mg at 07/11/21 2206   ondansetron (ZOFRAN) tablet 4 mg  4 mg Oral Q6H PRN Schnier, Dolores Lory, MD       Or   ondansetron [facility]) injection 4 mg  4 mg Intravenous Q6H PRN Schnier, Dolores Lory, MD   4 mg at 07/08/21 1429   pantoprazole (PROTONIX) EC tablet 40 mg  40 mg Oral Daily Elmore Guise, MD   40 mg at 07/11/21 0950   phenol (CHLORASEPTIC) mouth spray 1 spray  1 spray Mouth/Throat PRN Elmore Guise, MD       polyethylene glycol (MIRALAX / GLYCOLAX) packet 17 g  17 g Oral BID PRN Wendee Beavers T, MD       potassium chloride SA (KLOR-CON) CR tablet 20 mEq  20 mEq Oral Once Danford, Suann Larry, MD       senna-docusate (Senokot-S) tablet 1 tablet  1 tablet Oral QHS Schnier, Dolores Lory, MD   1 tablet at 07/11/21 2206   sertraline (ZOLOFT) tablet 25 mg  25 mg Oral QHS Schnier, Dolores Lory, MD   25 mg at 07/11/21 2206   sevelamer carbonate (RENVELA) tablet 800 mg  800 mg Oral TID WC Schnier, Dolores Lory, MD   800 mg at 07/12/21 [id number]   traZODone (DESYREL) tablet 25 mg  25 mg Oral QHS PRN Schnier, Dolores Lory, MD   25 mg at 07/03/21 2048    Musculoskeletal: Strength & Muscle Tone: decreased Gait & Station: unable to stand Patient leans: N/A            Psychiatric Specialty Exam:  Presentation  General Appearance:  No data recorded Eye Contact: No data recorded Speech: No data recorded Speech Volume: No data recorded Handedness: No data recorded  Mood and Affect  Mood: No data recorded Affect: No data  recorded  Thought Process  Thought Processes: No data recorded Descriptions of Associations:No data recorded Orientation:No data recorded Thought Content:No data recorded  History of Schizophrenia/Schizoaffective disorder:No data recorded Duration of Psychotic Symptoms:No data recorded Hallucinations:No data recorded Ideas of Reference:No data recorded Suicidal Thoughts:No data recorded Homicidal Thoughts:No data recorded  Sensorium  Memory: No data recorded Judgment: No data recorded Insight: No data recorded  Executive Functions  Concentration: No data recorded Attention Span: No data recorded Recall: No data recorded Fund of Knowledge: No data recorded Language: No data recorded  Psychomotor Activity  Psychomotor Activity: No data recorded  Assets  Assets: No data recorded  Sleep  Sleep: No data recorded  Physical Exam: Physical Exam Vitals and nursing note reviewed.  Constitutional:      Appearance: He is ill-appearing.  HENT:     Head: Normocephalic and atraumatic.     Mouth/Throat:     Pharynx: Oropharynx is clear.  Eyes:     Pupils: Pupils are equal, round, and reactive to light.  Cardiovascular:     Rate and Rhythm: Normal rate and regular rhythm.  Pulmonary:     Effort: Pulmonary effort is normal.     Breath sounds: Normal breath sounds.  Abdominal:     General: Abdomen is flat.     Palpations: Abdomen is soft.  Musculoskeletal:        General: Normal range of motion.  Skin:    General: Skin is warm and dry.  Neurological:     General: No focal deficit present.     Mental Status: He is alert. Mental status is at baseline.  Psychiatric:        Attention and Perception: Attention normal.        Mood and Affect: Mood normal. Affect is blunt.        Speech: Speech is delayed.        Behavior: Behavior is slowed.        Thought Content: Thought content normal. Thought content is not paranoid or delusional. Thought content does not  include homicidal or suicidal ideation.        Cognition and Memory: Cognition is impaired.   Review of Systems  Constitutional:  Positive for malaise/fatigue.  HENT: Negative.    Eyes: Negative.   Respiratory: Negative.    Cardiovascular: Negative.   Gastrointestinal: Negative.   Musculoskeletal: Negative.   Skin: Negative.   Neurological: Negative.   Psychiatric/Behavioral:  Negative for depression, hallucinations, memory loss, substance abuse and suicidal ideas. The patient is not nervous/anxious and does not have insomnia.   Blood pressure (!) 98/50, pulse 64, temperature 98.4 F (36.9 C), temperature source Oral, resp. rate 15, height '5\' 9"'$  (1.753 m), weight 86.6 kg, SpO2 98 %. Body mass index is 28.21 kg/m.  Treatment Plan Summary: Medication management and Plan this is a 75 year old man who has end-stage renal disease and complications of diabetes.  He is refusing dialysis.  On interview today the patient is able to spontaneously state that he knows that stopping dialysis will result in death.  He states that despite that he would prefer to not get dialysis because it is uncomfortable and he is tired of everything that he has been going through.  Patient does not appear to be psychotic.  It is possible that he may have some depressive symptoms influencing his thinking it is difficult to tell with his slowness of his cognition as it is right now.  Patient did seem to have some cognitive impairment.  There were occasions during the conversation when he would perseverate on the same answer even though I was asking him a different question.  After I repeated the question he was eventually able to give an appropriate answer every time.  Nevertheless the patient does show the ability to understand the options whether it would be getting dialysis or not and the potential outcomes and to state a preference which means that he does have capacity to make that decision.  After reviewing his chart I am  going to increase his mirtazapine to 15 mg a night and if he tolerates that would recommend going up to 30 mg within a day or so so that the antidepressant effect may be more visible.  We will follow up as needed.  Disposition: Patient does not meet criteria for psychiatric inpatient admission. Supportive therapy provided about ongoing stressors. Patient has capacity to make the decision to refuse dialysis treatment  Alethia Berthold, MD 07/12/2021 4:52 PM

## 2021-07-12 NOTE — Progress Notes (Signed)
Physical Therapy Treatment Patient Details Name: Ernest [name] MRN: [id number] DOB: August 29, 1946 Today's Date: 07/12/2021   History of Present Illness Pt is a 75 y/o M admitted on 06/26/21 from SNF long term care with c/o fever & chills. Pt found to have R heel decubitus ulcer & developed wet gangrene. Pt underwent R AKA on 07/08/21, prompting re-evaluation. PMH: ESRD On HD MWF, chronic systolic CHF, COPD, CAD, DVT, GERD, gout, Hep C, HTN, dyslipidemia, CVA    PT Comments    RN in room with patient upon arrival. He is alert and conversational however requires increased time to process and respond. Does need some reorientation to situation but agree to session and is extremely cooperative throughout. BP 109/73. RN removed o2 prior to session however needed to be placed back on when pt was sitting EOB. Max assist to roll L to short sit. Max vcs throughout however pt gives good effort. He continues to present with severe weakness. Per pt, " I would sit in electric w/c at [facility] throughout the day but was able to stand to get into chair. Pt sat EOB during this session for > 20 minutes while performing exercises and focusing on sitting balance improvements. He present with posterior lean that requires mostly min-mod assist however is able to fwd wt shift with vcs to sit x a few minutes with CGA. Returned to bed and Materials engineer. Pt performed several exercises in bed to promote return in strength and prevent contractures of RLE. Overall pt tolerated session well but does endorse fatigue. Acute PT will continue to follow and progress as able per current POC.Recommend returning to [facility] for rehab to assist pt back to PLOF.Will require extensive PT/OT going forward.    Recommendations for follow up therapy are one component of a multi-disciplinary discharge planning process, led by the attending physician.  Recommendations may be updated based on patient status, additional functional  criteria and insurance authorization.  Follow Up Recommendations  SNF;Supervision/Assistance - 24 hour     Equipment Recommendations  None recommended by PT       Precautions / Restrictions Precautions Precautions: Fall Restrictions Weight Bearing Restrictions: Yes RLE Weight Bearing: Non weight bearing     Mobility  Bed Mobility Overal bed mobility: Needs Assistance Bed Mobility: Rolling;Sidelying to Sit;Sit to Sidelying;Sit to Supine Rolling: Mod assist;Max assist (Able to reach for armrest and pull some but does continue to require extensive assistance) Sidelying to sit: Max assist (Increased time) Supine to sit: Max assist;+2 for safety/equipment (RN in room but did not assist) Sit to supine: Max assist Sit to sidelying: Mod assist General bed mobility comments: Pt was able to roll L to short sit with increased time and max assist/vcs. tiolerated EOB sitting x ~ 20 minutes    Transfers    General transfer comment: unsafe/unable to attempt this date. pt is still extremely weak and has poor sitting balance     Balance Overall balance assessment: Needs assistance Sitting-balance support: Feet supported;Single extremity supported Sitting balance-Leahy Scale: Poor Sitting balance - Comments: Pt sat EOB x ~ 20 minutes while performing exercises and being educated on POC and expectations going forward. Needs constant assistance and vcs for fwd wt shift and to use UEs to assist with maintaining balance. mostly min-mod assist but does have some times in which CGA only.         Cognition Arousal/Alertness: Awake/alert Behavior During Therapy: WFL for tasks assessed/performed Overall Cognitive Status: Within Functional  Limits for tasks assessed        General Comments: Pt was alert and conversational. oes require increased time to process and respond. Did have to reorient to overall situation.      Exercises Amputee Exercises Quad Sets: AROM;5 reps Gluteal Sets:  AROM;10 reps Hip Extension: Sidelying;AAROM;PROM (mostly passive) Hip ABduction/ADduction: AAROM;10 reps Straight Leg Raises: AROM;10 reps        Pertinent Vitals/Pain Pain Assessment: 0-10 Pain Score: 5  Faces Pain Scale: Hurts a little bit Pain Location: RLE Pain Descriptors / Indicators: Discomfort Pain Intervention(s): Limited activity within patient's tolerance;Monitored during session;Premedicated before session;Repositioned     PT Goals (current goals can now be found in the care plan section) Acute Rehab PT Goals Patient Stated Goal: none stated Progress towards PT goals: Progressing toward goals    Frequency    Min 2X/week      PT Plan Current plan remains appropriate       AM-PAC PT "6 Clicks" Mobility   Outcome Measure  Help needed turning from your back to your side while in a flat bed without using bedrails?: A Lot Help needed moving from lying on your back to sitting on the side of a flat bed without using bedrails?: A Lot Help needed moving to and from a bed to a chair (including a wheelchair)?: Total Help needed standing up from a chair using your arms (e.g., wheelchair or bedside chair)?: Total Help needed to walk in hospital room?: Total Help needed climbing 3-5 steps with a railing? : Total 6 Click Score: 8    End of Session Equipment Utilized During Treatment: Oxygen (RN removed o2 but had to reapply due to desaturation to 83%) Activity Tolerance: Patient tolerated treatment well;Patient limited by fatigue Patient left: in bed;with call bell/phone within reach;with bed alarm set;with family/visitor present Nurse Communication: Mobility status PT Visit Diagnosis: Muscle weakness (generalized) (M62.81);Difficulty in walking, not elsewhere classified (R26.2);Other abnormalities of gait and mobility (R26.89);Unsteadiness on feet (R26.81)     Time: [id number] PT Time Calculation (min) (ACUTE ONLY): 38 min  Charges:  $Therapeutic Exercise: 8-22  mins $Therapeutic Activity: 23-37 mins                     Julaine Fusi PTA 07/12/21, 9:42 AM

## 2021-07-12 NOTE — Progress Notes (Signed)
[facility]  PROGRESS NOTE   Subjective:   Patient seen and evaluated at bedside alert and oriented.  RN and primary at bedside. Patient refusing dialysis. States he wants to go home. When seen later with supervising physician, patient continues to state he does not want dialysis.   Objective:  Vital signs in last 24 hours:  Temp:  [97.8 F (36.6 C)-99.3 F (37.4 C)] 98 F (36.7 C) (09/28 0700) Pulse Rate:  [62-84] 70 (09/28 0802) Resp:  [9-20] 15 (09/28 0744) BP: (82-124)/(28-88) 82/61 (09/28 0802) SpO2:  [93 %-100 %] 98 % (09/28 0802) Weight:  [86.6 kg] 86.6 kg (09/28 0500)  Weight change: 0.637 kg Filed Weights   07/10/21 0500 07/11/21 0500 07/12/21 0500  Weight: 86.2 kg 86 kg 86.6 kg    Intake/Output: I/O last 3 completed shifts: In: 120 [P.O.:120] Out: -    Intake/Output this shift:  Total I/O In: 120 [P.O.:120] Out: -   Physical Exam: General:  No acute distress  Head:  Normocephalic, atraumatic. Moist oral mucosal membranes  Eyes:  Anicteric  Lungs:   Clear to auscultation, normal effort, [place] O2  Heart:  S1S2 no rubs  Abdomen:   Soft, nontender, bowel sounds present  Extremities:  No peripheral edema.  Right AKA on 07/08/21  Neurologic:  Awake, alert, following commands  Skin:  No lesions  Access: Rt groin Permcath    Basic Metabolic Panel: Recent Labs  Lab 07/08/21 0540 07/08/21 1813 07/09/21 0630 07/10/21 0516 07/11/21 0503 07/12/21 0520  NA 138  --  135 135 135 139  K 3.4*  --  4.8 4.8 4.0 4.4  CL 95*  --  94* 98 98 99  CO2 30  --  20* '24 28 24  '$ GLUCOSE 64*  --  246* 263* 154* 125*  BUN 31*  --  47* 57* 32* 44*  CREATININE 4.39* 5.13* 6.03* 7.39* 4.79* 6.42*  CALCIUM 9.2  --  9.2 9.0 8.4* 8.7*  MG 2.0  --  1.9 2.0 1.7 1.8  PHOS 4.3  --  6.8* 6.8* 4.3 5.2*     CBC: Recent Labs  Lab 07/08/21 1813 07/09/21 0630 07/10/21 0516 07/11/21 0503 07/12/21 0520  WBC 22.7* 23.0* 15.8* 10.2 11.6*  HGB 9.6* 9.0* 8.2* 7.9* 8.2*   HCT 29.9* 29.2* 25.3* 25.0* 24.8*  MCV 87.4 88.5 87.8 86.5 86.4  PLT 335 285 258 216 258      Urinalysis: No results for input(s): COLORURINE, LABSPEC, PHURINE, GLUCOSEU, HGBUR, BILIRUBINUR, KETONESUR, PROTEINUR, UROBILINOGEN, NITRITE, LEUKOCYTESUR in the last 72 hours.  Invalid input(s): APPERANCEUR    Imaging: No results found.   Medications:    magnesium sulfate bolus IVPB      acetaminophen  1,000 mg Oral TID   allopurinol  100 mg Oral Daily   amiodarone  200 mg Oral Daily   aspirin EC  81 mg Oral Daily   atorvastatin  40 mg Oral Daily   Chlorhexidine Gluconate Cloth  6 each Topical Q0600   docusate sodium  100 mg Oral Daily   epoetin (EPOGEN/PROCRIT) injection  10,000 Units Intravenous Q M,W,F-HD   heparin injection (subcutaneous)  5,000 Units Subcutaneous Q8H   insulin aspart  0-9 Units Subcutaneous TID AC & HS   levETIRAcetam  500 mg Oral BID   levETIRAcetam  500 mg Oral Once per day on Mon Wed Fri   midodrine  10 mg Oral QID   mirtazapine  7.5 mg Oral QHS   pantoprazole  40 mg  Oral Daily   potassium chloride  20 mEq Oral Once   senna-docusate  1 tablet Oral QHS   sertraline  25 mg Oral QHS   sevelamer carbonate  800 mg Oral TID WC    Assessment/ Plan:      Ernest [name] is a 75 year old male with history of hypertension, coronary artery disease, COPD, congestive heart failure, hepatitis C, hyperlipidemia, ESRD on hemodialysis Monday Wednesday Friday.  He is now being admitted with history of acute respiratory failure and also sepsis.  Principal Problem:   Wet gangrene ([place]) Active Problems:   Uncontrolled type 2 diabetes mellitus with end-stage renal disease ([place])   ESRD (end stage renal disease) ([place])   Septic shock (HCC)   PAD (peripheral artery disease) ([place])   S/P AKA (above knee amputation), right ([place])  [facility] [address]/MWF/Rt groin Permcath   #1: ESRD: Last dialysis received on 07/10/21. Spoke with patient this morning. Currently  refusing dialysis. Discussed this decision with patient and gained his understanding of what stopping dialysis would mean. Patient states he will "go to sleep." Patient understands it will be a forever sleep and he states his understanding. Will discontinue dialysis at this time and request palliative consult for end of life care.    #2: Right lower extremity osteomyelitis: S/p right lower extremity angiogram.  S/p right AKA on 07/08/21.  PT/OT ordered  #3: Anemia with chronic kidney diease: Hgb 8.2  Epogen with HD treatments   #4: Secondary hyperparathyroidism: Calcium and phosphorus not at target.  Sevelamer given with meals   #5: Hypotension: Soft BP, prescribed Midodrine. Transferred to stepdown unit postoperatively due to hypotension and lethargy. Was on Neo and given 586m fluid bolus with little resolve of symptoms.  Pressors discontinued on 07/10/21.  Asymptomatic with low BPs    LOS: 1[facility] 9/28/202212:46 PM

## 2021-07-12 NOTE — Progress Notes (Addendum)
Daily Progress Note   Patient Name: Ernest [name]       Date: 07/12/2021 DOB: [birth year]  Age: 75 y.o. MRN#: [id number] Attending Physician: Loletha Grayer, MD Primary Care Physician: Patient, [name] (Inactive) Admit Date: 06/26/2021  Reason for Consultation/Follow-up: Establishing goals of care  Subjective: Contacted by staff that patient no longer wants dialysis and wants to return to his facility with comfort care. Team is waiting for daughter who is HPOA to arrive for discussion as she is not aware of this information. She was expected to arrive this morning, and called to update staff she would be here later this afternoon. Psychiatry evaluation ordered to determine if patient has capacity to make this decision independently.    If he is deemed to have capacity, and continues to wish to stop dialysis, patient could choose to complete a new set of advanced directives including a living will if he chooses. His prognosis would be < 2 weeks if he stops dialysis, and he would certainly be hospice appropriate.   Length of Stay: 15  Current Medications: Scheduled Meds:  . acetaminophen  1,000 mg Oral TID  . allopurinol  100 mg Oral Daily  . amiodarone  200 mg Oral Daily  . aspirin EC  81 mg Oral Daily  . atorvastatin  40 mg Oral Daily  . Chlorhexidine Gluconate Cloth  6 each Topical Q0600  . docusate sodium  100 mg Oral Daily  . epoetin (EPOGEN/PROCRIT) injection  10,000 Units Intravenous Q M,W,F-HD  . heparin injection (subcutaneous)  5,000 Units Subcutaneous Q8H  . insulin aspart  0-9 Units Subcutaneous TID AC & HS  . levETIRAcetam  500 mg Oral BID  . levETIRAcetam  500 mg Oral Once per day on Mon Wed Fri  . midodrine  10 mg Oral QID  . mirtazapine  7.5 mg Oral QHS  .  pantoprazole  40 mg Oral Daily  . potassium chloride  20 mEq Oral Once  . senna-docusate  1 tablet Oral QHS  . sertraline  25 mg Oral QHS  . sevelamer carbonate  800 mg Oral TID WC    Continuous Infusions: . magnesium sulfate bolus IVPB      PRN Meds: alum & mag hydroxide-simeth, feeding supplement (NEPRO CARB STEADY), guaiFENesin-dextromethorphan, hydrALAZINE, labetalol, magnesium sulfate bolus IVPB, ondansetron **OR** ondansetron (  ZOFRAN) IV, phenol, polyethylene glycol, traZODone  Physical Exam Constitutional:      Comments: Resting with eyes closed.   Pulmonary:     Effort: Pulmonary effort is normal.            Vital Signs: BP (!) 89/36 (BP Location: Left Wrist)   Pulse 66   Temp 98.4 F (36.9 C) (Oral)   Resp 20   Ht '5\' 9"'$  (1.753 m)   Wt 86.6 kg   SpO2 97%   BMI 28.21 kg/m  SpO2: SpO2: 97 % O2 Device: O2 Device: Nasal Cannula O2 Flow Rate: O2 Flow Rate (L/min): 1 L/min  Intake/output summary:  Intake/Output Summary (Last 24 hours) at 07/12/2021 1437 Last data filed at 07/12/2021 1400 Gross per 24 hour  Intake 120 ml  Output 0 ml  Net 120 ml   LBM: Last BM Date: 07/05/21 Baseline Weight: Weight: 82 kg Most recent weight: Weight: 86.6 kg Flowsheet Rows    Flowsheet Row Most Recent Value  Intake Tab   Referral Department Hospitalist  Unit at Time of Referral Med/Surg Unit  Palliative Care Primary Diagnosis Sepsis/Infectious Disease  Date Notified 06/28/21  Palliative Care Type New Palliative care  Reason for referral Clarify Goals of Care  Date of Admission 06/26/21  Date first seen by Palliative Care 06/29/21  # of days Palliative referral response time 1 Day(s)  # of days IP prior to Palliative referral 2  Clinical Assessment   Psychosocial & Spiritual Assessment   Palliative Care Outcomes        Patient Active Problem List   Diagnosis Date Noted  . Hypotension   . Acute delirium   . Wet gangrene ([place]) 07/10/2021  . S/P AKA (above knee  amputation), right ([place]) 07/10/2021  . PAD (peripheral artery disease) ([place]) 07/08/2021  . Sepsis without acute organ dysfunction ([place]) 06/27/2021  . Disorder of phosphorus metabolism, unspecified 02/20/2021  . Hypercalcemia 12/08/2020  . Atherosclerotic heart disease of native coronary artery without angina pectoris 11/18/2020  . Diarrhea, unspecified 11/18/2020  . Fever, unspecified 11/18/2020  . Other fluid overload 11/18/2020  . Pleural effusion on left 09/30/2019  . Acute respiratory failure with hypoxia ([place]) 09/30/2019  . Pleural effusion 09/30/2019  . Chest pain 12/02/2018  . Vascular dementia ([place]) 10/10/2017  . Paroxysmal atrial flutter ([place]) 10/10/2017  . Altered mental status, unspecified 10/09/2017  . Other malaise 10/09/2017  . S/P CABG x 4 10/04/2017  . Cerebral embolism with cerebral infarction 09/20/2017  . NSVT (nonsustained ventricular tachycardia) ([place])   . Dyspnea 09/08/2017  . Non-ST elevation MI (NSTEMI) ([place])   . Acute on chronic combined systolic and diastolic CHF (congestive heart failure) ([place])   . End-stage renal disease on hemodialysis ([place])   . Left leg weakness   . TIA (transient ischemic attack) 10/27/2016  . Dependence on renal dialysis ([place]) 08/09/2015  . History of CVA with residual deficit 08/07/2015  . Seizure, late effect of stroke ([place]) 08/07/2015  . Diabetic peripheral neuropathy ([place]) 08/07/2015  . Unspecified protein-calorie malnutrition ([place]) 05/27/2015  . Coagulation defect, unspecified ([place]) 07/16/2014  . Type 2 diabetes mellitus with diabetic peripheral angiopathy without gangrene ([place]) 07/01/2014  . Pruritus, unspecified 05/26/2014  . Elevated prostate specific antigen (PSA) 08/03/2013  . Encounter for immunization 07/10/2013  . Obstructive sleep apnea 06/16/2013  . Hyperlipidemia associated with type 2 diabetes mellitus ([place])   . ESRD (end stage renal disease) ([place])   . Erectile dysfunction associated with type 2 diabetes mellitus  (  [place])   . Hypertension due to end stage renal disease caused by type 2 diabetes mellitus, on dialysis ([place])   . COPD (chronic obstructive pulmonary disease) ([place])   . Chronic combined systolic and diastolic CHF (congestive heart failure) ([place])   . Anemia of chronic disease 12/27/2011  . Long term (current) use of insulin ([place]) 11/03/2009  . Gout, unspecified 02/28/2009  . Iron deficiency anemia, unspecified 10/30/2008  . Secondary hyperparathyroidism of renal origin ([place]) 10/30/2008  . Type 2 diabetes mellitus with hyperglycemia, without long-term current use of insulin ([place]) 06/08/1984    Palliative Care Assessment & Plan    Recommendations/Plan: If he is deemed to have capacity, and continues to wish to stop dialysis, patient could choose to complete a new set of advanced directives including a living will if he chooses. His prognosis would be < 2 weeks if he stops dialysis, and he would certainly be hospice appropriate.   Code Status:    Code Status Orders  (From admission, onward)           Start     Ordered   07/08/21 1626  Full code  Continuous        07/08/21 1625           Code Status History     Date Active Date Inactive Code Status Order ID Comments User Context   06/27/2021 0250 07/08/2021 1456 Full Code [id number]  Christel Mormon, MD ED   09/30/2019 1657 09/30/2019 2058 Full Code [id number]  Ina Homes, MD ED   09/08/2017 0359 10/07/2017 2150 Full Code [id number]  Collier Salina, MD ED   10/27/2016 2044 10/29/2016 2121 Full Code [id number]  Zada Finders, MD Inpatient   07/13/2015 1652 07/21/2015 1336 Full Code [id number]  Cathlyn Parsons, [name] Inpatient   07/11/2015 1629 07/13/2015 1652 Full Code [id number]  Juluis Mire, MD ED   04/29/2015 1352 04/30/2015 2038 Full Code [id number]  Lucious Groves, DO Inpatient   08/27/2013 0427 08/27/2013 1845 Full Code [id number]  Mal Misty, MD Inpatient       Care plan was discussed with RN  Thank you for  allowing the Palliative Medicine Team to assist in the care of this patient.       Total Time 15 min Prolonged Time Billed  no       Greater than 50%  of this time was spent counseling and coordinating care related to the above assessment and plan.  Asencion Gowda, NP  Please contact Palliative Medicine Team phone at [phone] for questions and concerns.

## 2021-07-13 LAB — GLUCOSE, CAPILLARY
Glucose-Capillary: 102 mg/dL — ABNORMAL HIGH (ref 70–99)
Glucose-Capillary: 110 mg/dL — ABNORMAL HIGH (ref 70–99)
Glucose-Capillary: 124 mg/dL — ABNORMAL HIGH (ref 70–99)
Glucose-Capillary: 139 mg/dL — ABNORMAL HIGH (ref 70–99)
Glucose-Capillary: 93 mg/dL (ref 70–99)
Glucose-Capillary: 98 mg/dL (ref 70–99)

## 2021-07-13 NOTE — Progress Notes (Signed)
OT Cancellation Note  Patient Details Name: Ernest [name] MRN: [id number] DOB: 19-Jun-1946   Cancelled Treatment:    Reason Eval/Treat Not Completed: Other (comment) Chart review indicates that pt is transitioning to comfort measures. However, noted that this evening pt's family encouraging dialysis and hospice services are holding at this time. Will await clearer decisions from family and medical team regarding [facility] and only follow patient peripherally concerning therapy services at this time. Will f/u for treatment only if appropriate/honors patient's wishes for his treatment. Thank you.  Gerrianne Scale, [place], OTR/L ascom [phone] 07/13/21, 6:41 PM

## 2021-07-13 NOTE — Progress Notes (Signed)
Manufacturing engineer [facility])  Received referral for Hospice Home.  Reviewing chart and noted that family may have had a discussion with Mr. Hjort to encourage him to restart HD.   [facility] will not engage at this time as the [place] seems to have shifted somewhat.  [facility] will follow peripherally.  Thank you, Venia Carbon RN, BSN, [facility] Liaison

## 2021-07-13 NOTE — Plan of Care (Signed)
Daughter came room and asked dad why he wasn't eating or going to dialysis.  At first the response was "because I dont want to." Then after further discussions, daughter told dad, "Im worried about you and you don't want [place] to be worried about you.  You need to do diaysis and get better for [place]."  After a bit, daughter came out of the room stating, "he's agreeing to dialysis."  Hosp, Palliative and Nephro notified of situation. Nephro responded that he does need "emergency" dialysis tonight and Dr. would prefer to discuss/confirm decision with patient in the morning.

## 2021-07-13 NOTE — Progress Notes (Signed)
[facility] Daily Progress Note  07/08/21: Right Above The Knee Amputation  Subjective: Patient resting comfortably this AM.  No acute issues overnight.  Objective: Vitals:   07/13/21 0300 07/13/21 0400 07/13/21 0500 07/13/21 0600  BP: (!) 115/52 (!) 118/49 (!) 124/56 (!) 117/52  Pulse: 62 (!) 59 60 (!) 58  Resp: '12 11 11 '$ (!) 9  Temp:  98.2 F (36.8 C)    TempSrc:  Oral    SpO2: 97% 95% 97% 96%  Weight:   84.6 kg   Height:        Intake/Output Summary (Last 24 hours) at 07/13/2021 1032 Last data filed at 07/12/2021 1900 Gross per 24 hour  Intake 60 ml  Output 0 ml  Net 60 ml   Physical Exam: A&Ox3, NAD CV: RRR Pulmonary: CTA Bilaterally Abdomen: Soft, Nontender, Nondistended Vascular:             Right lower extremity: Thigh soft. Stump is warm and healthy.  Incision is clean dry and intact.   Laboratory: CBC    Component Value Date/Time   WBC 11.6 (H) 07/12/2021 0520   HGB 8.2 (L) 07/12/2021 0520   HCT 24.8 (L) 07/12/2021 0520   PLT 258 07/12/2021 0520   BMET    Component Value Date/Time   NA 139 07/12/2021 0520   NA 140 12/11/2017 0000   K 4.4 07/12/2021 0520   CL 99 07/12/2021 0520   CO2 24 07/12/2021 0520   GLUCOSE 125 (H) 07/12/2021 0520   BUN 44 (H) 07/12/2021 0520   BUN 16 12/11/2017 0000   CREATININE 6.42 (H) 07/12/2021 0520   CALCIUM 8.7 (L) 07/12/2021 0520   CALCIUM 8.4 (L) 10/05/2017 0615   GFRNONAA 9 (L) 07/12/2021 0520   GFRAA 21 (L) 12/04/2019 2027   Assessment/Planning: The patient is a 75 year old male with multiple medical issues who presented to the [facility] emergency department from his nursing facility at [facility] with severe wounds to the right foot now status post right above-the-knee amputation - POD #5   1) Hemoglobin is stable. Still with elevated white cell count. AM CBC. 2) Physical examination was notable for a warm healthy stump.  Incision is clean dry and intact. 4) Physical  and Occupational Therapy recommending SNF.  We are in agreement.  There is also discussion in regard to possibly transition to hospice.  Patient and his family are currently working with psychiatry and palliative care. 5) On aspirin and statin for medical management. 5) From a vascular standpoint, the patient can be discharged when medically stable. 6) Continue daily stump dressing changes. 7) No further recommendations from vascular surgery at this time.   Discussed with Dr. Ellis Parents Wilbur Labuda [name] 07/13/2021 10:32 AM

## 2021-07-13 NOTE — Progress Notes (Signed)
Physical Therapy Discharge Patient Details Name: Ernest [name] MRN: [id number] DOB: [birth year] Today's Date: 07/13/2021 Time:  -     Patient discharged from PT services.Per chart review, pt is going comfort and is no longer a good rehab candidate. Will sign off.   Please see latest therapy progress note for current level of functioning and progress toward goals.    Julaine Fusi PTA 07/13/21, 4:05 PM

## 2021-07-13 NOTE — Progress Notes (Signed)
Patient ID: MAVRYK KICE, male   DOB: [birth year], 75 y.o.   MRN: [id number] [facility] PROGRESS NOTE  Ernest [name] [id number] DOB: 1946-06-13 DOA: 06/26/2021 PCP: Patient, [name] (Inactive)  HPI/Subjective: Patient again refused dialysis today.  He refused medications last night.  He states he wants to go home.  I asked him what home meant and he said heaven.  I explained to him that if he keeps on refusing dialysis he will pass away.  The patient again stated he wanted to go home.  Objective: Vitals:   07/13/21 0500 07/13/21 0600  BP: (!) 124/56 (!) 117/52  Pulse: 60 (!) 58  Resp: 11 (!) 9  Temp:    SpO2: 97% 96%    Intake/Output Summary (Last 24 hours) at 07/13/2021 1318 Last data filed at 07/12/2021 1900 Gross per 24 hour  Intake 60 ml  Output 0 ml  Net 60 ml   Filed Weights   07/11/21 0500 07/12/21 0500 07/13/21 0500  Weight: 86 kg 86.6 kg 84.6 kg    ROS: Review of Systems  Respiratory:  Negative for shortness of breath.   Cardiovascular:  Negative for chest pain.  Gastrointestinal:  Negative for abdominal pain.  Exam: Physical Exam HENT:     Head: Normocephalic.     Mouth/Throat:     Pharynx: No oropharyngeal exudate.  Eyes:     General: Lids are normal.     Conjunctiva/sclera: Conjunctivae normal.  Cardiovascular:     Rate and Rhythm: Normal rate and regular rhythm.     Heart sounds: Normal heart sounds, S1 normal and S2 normal.  Pulmonary:     Breath sounds: Examination of the right-lower field reveals decreased breath sounds. Examination of the left-lower field reveals decreased breath sounds. Decreased breath sounds present. No wheezing, rhonchi or rales.  Abdominal:     Palpations: Abdomen is soft.     Tenderness: There is no abdominal tenderness.  Musculoskeletal:     Left lower leg: Swelling present.  Skin:    General: Skin is warm.     Findings: No rash.  Neurological:     Mental Status: He is alert.     Comments: Answer some  simple questions.      Scheduled Meds:  acetaminophen  1,000 mg Oral TID   allopurinol  100 mg Oral Daily   amiodarone  200 mg Oral Daily   aspirin EC  81 mg Oral Daily   atorvastatin  40 mg Oral Daily   Chlorhexidine Gluconate Cloth  6 each Topical Q0600   docusate sodium  100 mg Oral Daily   epoetin (EPOGEN/PROCRIT) injection  10,000 Units Intravenous Q M,W,F-HD   heparin injection (subcutaneous)  5,000 Units Subcutaneous Q8H   insulin aspart  0-9 Units Subcutaneous TID AC & HS   levETIRAcetam  500 mg Oral BID   levETIRAcetam  500 mg Oral Once per day on Mon Wed Fri   midodrine  10 mg Oral QID   mirtazapine  15 mg Oral QHS   pantoprazole  40 mg Oral Daily   potassium chloride  20 mEq Oral Once   senna-docusate  1 tablet Oral QHS   sertraline  25 mg Oral QHS   sevelamer carbonate  800 mg Oral TID WC   Continuous Infusions:  magnesium sulfate bolus IVPB      Assessment/Plan:  Postoperative hypotension.  Off pressors and on midodrine and if he will take. End-stage renal disease.  Currently refusing hemodialysis.  I explained  to the patient that he will pass away if he does not do dialysis.  He states that he wants to go home.  I asked him what home meant and he said heaven.  Spoke with the patient's daughter on the phone and she will come into visit and talk with him.  Palliative care also following.  Psychiatry saw him yesterday and stated he has the capacity to make this decision. Sepsis with gangrene of the right heel.  Patient had a right AKA on 07/08/2021.  Off antibiotics at this point. Anemia of chronic disease.  Last hemoglobin 8.2 Chronic combined systolic and diastolic congestive heart failure.  Patient refusing dialysis at this point. Type 2 diabetes mellitus with hyperglycemia.  Last hemoglobin A1c 8.4. Peripheral vascular disease on aspirin and statin if he takes History of CAD on aspirin and statin History of seizure on Keppra.  We will get rid of evening dose 3  times a week since he is refusing dialysis.  Continue twice a day dosing History of arrhythmia on amiodarone if able to take.      Code Status:     Code Status Orders  (From admission, onward)           Start     Ordered   07/08/21 1626  Full code  Continuous        07/08/21 1625           Code Status History     Date Active Date Inactive Code Status Order ID Comments User Context   06/27/2021 0250 07/08/2021 1456 Full Code [id number]  Christel Mormon, MD ED   09/30/2019 1657 09/30/2019 2058 Full Code [id number]  Ina Homes, MD ED   09/08/2017 0359 10/07/2017 2150 Full Code [id number]  Collier Salina, MD ED   10/27/2016 2044 10/29/2016 2121 Full Code [id number]  Zada Finders, MD Inpatient   07/13/2015 1652 07/21/2015 1336 Full Code [id number]  Cathlyn Parsons, [name] Inpatient   07/11/2015 1629 07/13/2015 1652 Full Code [id number]  Juluis Mire, MD ED   04/29/2015 1352 04/30/2015 2038 Full Code [id number]  Lucious Groves, DO Inpatient   08/27/2013 0427 08/27/2013 1845 Full Code [id number]  Mal Misty, MD Inpatient      Family Communication: Spoke with patient's daughter on the phone.  She will come in and visit with him and talk with him more. Disposition Plan: Status is: Inpatient  Dispo: The patient is from: Rehab              Anticipated d/c is to: Hospice home versus facility with hospice depending on clinical course              Patient currently patient refusing dialysis and will likely pass away if he does not want to undergo dialysis anymore.   Difficult to place patient.  Hopefully not  Consultants: Palliative care Nephrology Vascular surgery  Procedures: Right AKA  Time spent: 27 minutes, case discussed with palliative care and nephrology  [place]

## 2021-07-13 NOTE — Progress Notes (Addendum)
Daily Progress Note   Patient Name: Ernest [name]       Date: 07/13/2021 DOB: 06-23-1946  Age: 75 y.o. MRN#: [id number] Attending Physician: Loletha Grayer, MD Primary Care Physician: Patient, [name] (Inactive) Admit Date: 06/26/2021  Reason for Consultation/Follow-up: Establishing goals of care  Subjective: Patient is resting in bed. Discussed his wishes. He states he does not want to continue dialysis. He tells me "I'll die" when asked what would happen if he does not have dialysis. He tells me numerous times "I want to go home". When asked what "home" means he says "heaven." He said "I don't want to do dialysis". When asked what specifically about dialysis, he said "I don't want to do any of this, I'm ready to go home." He states he would want his daughter to help make decisions. Inquired how he would feel if his family was not in agreement with his decisions, and he quickly said "I want to go home". Spoke with attending to update.   Called to speak to daughter [name]. Her sister Stanton Kidney who is a Marine scientist was on the phone. Stanton Kidney is a Marine scientist in a nursing facility, and was previously hospital based nurse. We had a very in depth conversation that lasted 1 hour and 46 minutes.   We discussed his diagnoses, prognosis, GOC, EOL wishes disposition and options.  Created space and opportunity for patient  to explore thoughts and feelings regarding current medical information.   A detailed discussion was had today regarding advanced directives.  Concepts specific to code status, artifical feeding and hydration, IV antibiotics and rehospitalization were discussed.  The difference between an aggressive medical intervention path and a comfort care path was discussed.  Values and goals of care important to  patient and family were attempted to be elicited.  Discussed limitations of medical interventions to prolong quality of life in some situations and discussed the concept of human mortality.   She states she is not ready to let him go. Discussed patient wishes and importance of honoring his wishes. Discussed the difficulties of being in the roll of HPOA, and daughter. Discussed that he currently has decision making ability but without dialysis, and in the dying process will lose his cognitive function where she will be the surrogate decision maker. She questions sedation or "putting him to  sleep" to have dialysis. Talked through this to discuss overall goal. Discussed his boundaries for acceptable QOL. Discussed attempting to force care on someone who does not want it. Discussed doing pieces/components of care that are not really feasible/sustainable such as dialysis with no labs or medications, glucose control with no dialysis and dialysis with no glucose control, and discussed the various scenarios; discussed how organ systems work together. Discussed pain and suffering. Discussed honoring his wishes in depth.   Stanton Kidney discussed not forcing care. Questions answered for both daughters about hospice at home and hospice facility as she initially discusses home with hospice. Discussed what help she would have and symptoms management needs. She opts for hospice facility placement. MOST form completed for comfort care.   Following MOST form completion she discusses care if he changes his mind. Discussed overall clinical picture. She states she will talk to other family to make sure they are okay with his decision and will allow it. Discussed that he currently has decision making capacity and discussed continuation of his wishes when he can no longer make decisions. Ultimately she states to initiate a bed request for hospice facility and to proceed with comfort care.   She verbalizes understanding and appreciation  of detailed discussion.   After speaking with patient regarding his wishes, I completed a MOST form today through [place] with HPOA daughter [name], and daughter Stanton Kidney. The patient outlined their wishes for the following treatment decisions:  Cardiopulmonary Resuscitation: Do Not Attempt Resuscitation (DNR/No CPR)  Medical Interventions: Comfort Measures: Keep clean, warm, and dry. Use medication by any route, positioning, wound care, and other measures to relieve pain and suffering. Use oxygen, suction and manual treatment of airway obstruction as needed for comfort. Do not transfer to the hospital unless comfort needs cannot be met in current location.  Antibiotics: No antibiotics (use other measures to relieve symptoms)  IV Fluids: No IV fluids (provide other measures to ensure comfort)  Feeding Tube: No feeding tube     Length of Stay: 16  Current Medications: Scheduled Meds:  . acetaminophen  1,000 mg Oral TID  . allopurinol  100 mg Oral Daily  . amiodarone  200 mg Oral Daily  . aspirin EC  81 mg Oral Daily  . atorvastatin  40 mg Oral Daily  . Chlorhexidine Gluconate Cloth  6 each Topical Q0600  . docusate sodium  100 mg Oral Daily  . epoetin (EPOGEN/PROCRIT) injection  10,000 Units Intravenous Q M,W,F-HD  . heparin injection (subcutaneous)  5,000 Units Subcutaneous Q8H  . insulin aspart  0-9 Units Subcutaneous TID AC & HS  . levETIRAcetam  500 mg Oral BID  . midodrine  10 mg Oral QID  . mirtazapine  15 mg Oral QHS  . pantoprazole  40 mg Oral Daily  . potassium chloride  20 mEq Oral Once  . senna-docusate  1 tablet Oral QHS  . sertraline  25 mg Oral QHS  . sevelamer carbonate  800 mg Oral TID WC    Continuous Infusions:   PRN Meds: alum & mag hydroxide-simeth, feeding supplement (NEPRO CARB STEADY), guaiFENesin-dextromethorphan, hydrALAZINE, labetalol, ondansetron **OR** ondansetron (ZOFRAN) IV, phenol, polyethylene glycol, traZODone  Physical Exam Pulmonary:     Effort:  Pulmonary effort is normal.  Neurological:     Mental Status: He is alert.            Vital Signs: BP (!) 117/52   Pulse (!) 58   Temp 98.2 F (36.8 C) (Oral)   Resp [name])  9   Ht _0  (1.753 m)   Wt 84.6 kg   SpO2 96%   BMI 27.54 kg/m  SpO2: SpO2: 96 % O2 Device: O2 Device: Nasal Cannula O2 Flow Rate: O2 Flow Rate (L/min): 1 L/min  Intake/output summary:  Intake/Output Summary (Last 24 hours) at 07/13/2021 1414 Last data filed at 07/12/2021 1900 Gross per 24 hour  Intake 60 ml  Output --  Net 60 ml   LBM: Last BM Date: 07/05/21 Baseline Weight: Weight: 82 kg Most recent weight: Weight: 84.6 kg   Flowsheet Rows    Flowsheet Row Most Recent Value  Intake Tab   Referral Department Hospitalist  Unit at Time of Referral Med/Surg Unit  Palliative Care Primary Diagnosis Sepsis/Infectious Disease  Date Notified 06/28/21  Palliative Care Type New Palliative care  Reason for referral Clarify Goals of Care  Date of Admission 06/26/21  Date first seen by Palliative Care 06/29/21  # of days Palliative referral response time 1 Day(s)  # of days IP prior to Palliative referral 2  Clinical Assessment   Psychosocial & Spiritual Assessment   Palliative Care Outcomes        Patient Active Problem List   Diagnosis Date Noted  . Hypotension   . Acute delirium   . Wet gangrene ([place]) 07/10/2021  . S/P AKA (above knee amputation), right ([place]) 07/10/2021  . PAD (peripheral artery disease) ([place]) 07/08/2021  . Sepsis without acute organ dysfunction ([place]) 06/27/2021  . Disorder of phosphorus metabolism, unspecified 02/20/2021  . Hypercalcemia 12/08/2020  . Atherosclerotic heart disease of native coronary artery without angina pectoris 11/18/2020  . Diarrhea, unspecified 11/18/2020  . Fever, unspecified 11/18/2020  . Other fluid overload 11/18/2020  . Pleural effusion on left 09/30/2019  . Acute respiratory failure with hypoxia ([place]) 09/30/2019  . Pleural effusion 09/30/2019  .  Chest pain 12/02/2018  . Vascular dementia ([place]) 10/10/2017  . Paroxysmal atrial flutter ([place]) 10/10/2017  . Altered mental status, unspecified 10/09/2017  . Other malaise 10/09/2017  . S/P CABG x 4 10/04/2017  . Cerebral embolism with cerebral infarction 09/20/2017  . NSVT (nonsustained ventricular tachycardia) ([place])   . Dyspnea 09/08/2017  . Non-ST elevation MI (NSTEMI) ([place])   . Acute on chronic combined systolic and diastolic CHF (congestive heart failure) ([place])   . End-stage renal disease on hemodialysis ([place])   . Left leg weakness   . TIA (transient ischemic attack) 10/27/2016  . Dependence on renal dialysis ([place]) 08/09/2015  . History of CVA with residual deficit 08/07/2015  . Seizure, late effect of stroke ([place]) 08/07/2015  . Diabetic peripheral neuropathy ([place]) 08/07/2015  . Unspecified protein-calorie malnutrition ([place]) 05/27/2015  . Coagulation defect, unspecified ([place]) 07/16/2014  . Type 2 diabetes mellitus with diabetic peripheral angiopathy without gangrene ([place]) 07/01/2014  . Pruritus, unspecified 05/26/2014  . Elevated prostate specific antigen (PSA) 08/03/2013  . Encounter for immunization 07/10/2013  . Obstructive sleep apnea 06/16/2013  . Hyperlipidemia associated with type 2 diabetes mellitus ([place])   . ESRD (end stage renal disease) ([place])   . Erectile dysfunction associated with type 2 diabetes mellitus ([place])   . Hypertension due to end stage renal disease caused by type 2 diabetes mellitus, on dialysis ([place])   . COPD (chronic obstructive pulmonary disease) ([place])   . Chronic combined systolic and diastolic CHF (congestive heart failure) ([place])   . Anemia of chronic disease 12/27/2011  . Long term (current) use of insulin ([place]) 11/03/2009  . Gout, unspecified 02/28/2009  .  Iron deficiency anemia, unspecified 10/30/2008  . Secondary hyperparathyroidism of renal origin ([place]) 10/30/2008  . Type 2 diabetes mellitus with hyperglycemia, without long-term current use of  insulin ([place]) 06/08/1984    Palliative Care Assessment & Plan    Recommendations/Plan: Recommend hospice facility placement. Daughter advises patient does not like his current facility and will not be returning there for end of life care.    Code Status:    Code Status Orders  (From admission, onward)           Start     Ordered   07/08/21 1626  Full code  Continuous        07/08/21 1625           Code Status History     Date Active Date Inactive Code Status Order ID Comments User Context   06/27/2021 0250 07/08/2021 1456 Full Code [id number]  Christel Mormon, MD ED   09/30/2019 1657 09/30/2019 2058 Full Code [id number]  Ina Homes, MD ED   09/08/2017 0359 10/07/2017 2150 Full Code [id number]  Collier Salina, MD ED   10/27/2016 2044 10/29/2016 2121 Full Code [id number]  Zada Finders, MD Inpatient   07/13/2015 1652 07/21/2015 1336 Full Code [id number]  Cathlyn Parsons, [name] Inpatient   07/11/2015 1629 07/13/2015 1652 Full Code [id number]  Juluis Mire, MD ED   04/29/2015 1352 04/30/2015 2038 Full Code [id number]  Lucious Groves, DO Inpatient   08/27/2013 0427 08/27/2013 1845 Full Code [id number]  Mal Misty, MD Inpatient       Prognosis:  < 2 weeks    Care plan was discussed with attending MD and RN    Thank you for allowing the Palliative Medicine Team to assist in the care of this patient.   Time In: 12:40 Time Out: 2:50 Total Time 2 hours 10 minutes Prolonged Time Billed  yes       Greater than 50%  of this time was spent counseling and coordinating care related to the above assessment and plan.  Asencion Gowda, NP  Please contact Palliative Medicine Team phone at [phone] for questions and concerns.

## 2021-07-13 NOTE — Progress Notes (Signed)
   07/13/21 1735  Clinical Encounter Type  Visited With Patient and family together  Visit Type Initial;Spiritual support  Referral From Nurse  Consult/Referral To Chaplain  Spiritual Encounters  Spiritual Needs Emotional;Brochure  Chaplain Romond Pipkins administered one AD Edu for Mr. Laif Kohlhoff and two family members. As I was exiting the room I heard the family beginning to pray and I asked if I could join them in the prayer. Chaplain joined Emergency planning/management officer with two family members around Mr. Klinker bedside. The daughter asked her father (Mr.Murrell ) to pray, and he muscled  up the strength to say, I want you to pray and nodded his head towards me. Chaplain prayed with the family and offered words of encouragement afterwards. Family will call Chaplains tomorrow morning to complete AD.

## 2021-07-13 NOTE — Progress Notes (Signed)
[facility]  PROGRESS NOTE   Subjective:   Patient seen sitting up in bed Eating breakfast  Continues to state he does not want dialysis Denies shortness of breath  Objective:  Vital signs in last 24 hours:  Temp:  [98 F (36.7 C)-98.4 F (36.9 C)] 98.2 F (36.8 C) (09/29 0400) Pulse Rate:  [58-67] 58 (09/29 0600) Resp:  [9-20] 9 (09/29 0600) BP: (55-124)/(36-94) 117/52 (09/29 0600) SpO2:  [95 %-98 %] 96 % (09/29 0600) Weight:  [84.6 kg] 84.6 kg (09/29 0500)  Weight change: -2.037 kg Filed Weights   07/11/21 0500 07/12/21 0500 07/13/21 0500  Weight: 86 kg 86.6 kg 84.6 kg    Intake/Output: I/O last 3 completed shifts: In: 180 [P.O.:180] Out: 0    Intake/Output this shift:  No intake/output data recorded.  Physical Exam: General:  No acute distress  Head:  Normocephalic, atraumatic. Moist oral mucosal membranes  Eyes:  Anicteric  Lungs:   Clear to auscultation, normal effort, [place] O2  Heart:  S1S2 no rubs  Abdomen:   Soft, nontender, bowel sounds present  Extremities:  No peripheral edema.  Right AKA on 07/08/21  Neurologic:  Awake, alert, following commands  Skin:  No lesions  Access: Rt groin Permcath    Basic Metabolic Panel: Recent Labs  Lab 07/08/21 0540 07/08/21 1813 07/09/21 0630 07/10/21 0516 07/11/21 0503 07/12/21 0520  NA 138  --  135 135 135 139  K 3.4*  --  4.8 4.8 4.0 4.4  CL 95*  --  94* 98 98 99  CO2 30  --  20* '24 28 24  '$ GLUCOSE 64*  --  246* 263* 154* 125*  BUN 31*  --  47* 57* 32* 44*  CREATININE 4.39* 5.13* 6.03* 7.39* 4.79* 6.42*  CALCIUM 9.2  --  9.2 9.0 8.4* 8.7*  MG 2.0  --  1.9 2.0 1.7 1.8  PHOS 4.3  --  6.8* 6.8* 4.3 5.2*     CBC: Recent Labs  Lab 07/08/21 1813 07/09/21 0630 07/10/21 0516 07/11/21 0503 07/12/21 0520  WBC 22.7* 23.0* 15.8* 10.2 11.6*  HGB 9.6* 9.0* 8.2* 7.9* 8.2*  HCT 29.9* 29.2* 25.3* 25.0* 24.8*  MCV 87.4 88.5 87.8 86.5 86.4  PLT 335 285 258 216 258      Urinalysis: No results  for input(s): COLORURINE, LABSPEC, PHURINE, GLUCOSEU, HGBUR, BILIRUBINUR, KETONESUR, PROTEINUR, UROBILINOGEN, NITRITE, LEUKOCYTESUR in the last 72 hours.  Invalid input(s): APPERANCEUR    Imaging: No results found.   Medications:    magnesium sulfate bolus IVPB      acetaminophen  1,000 mg Oral TID   allopurinol  100 mg Oral Daily   amiodarone  200 mg Oral Daily   aspirin EC  81 mg Oral Daily   atorvastatin  40 mg Oral Daily   Chlorhexidine Gluconate Cloth  6 each Topical Q0600   docusate sodium  100 mg Oral Daily   epoetin (EPOGEN/PROCRIT) injection  10,000 Units Intravenous Q M,W,F-HD   heparin injection (subcutaneous)  5,000 Units Subcutaneous Q8H   insulin aspart  0-9 Units Subcutaneous TID AC & HS   levETIRAcetam  500 mg Oral BID   levETIRAcetam  500 mg Oral Once per day on Mon Wed Fri   midodrine  10 mg Oral QID   mirtazapine  15 mg Oral QHS   pantoprazole  40 mg Oral Daily   potassium chloride  20 mEq Oral Once   senna-docusate  1 tablet Oral QHS   sertraline  25 mg Oral QHS   sevelamer carbonate  800 mg Oral TID WC    Assessment/ Plan:      Ernest [name] is a 75 year old male with history of hypertension, coronary artery disease, COPD, congestive heart failure, hepatitis C, hyperlipidemia, ESRD on hemodialysis Monday Wednesday Friday.  He is now being admitted with history of acute respiratory failure and also sepsis.  Principal Problem:   ESRD (end stage renal disease) ([place]) Active Problems:   Type 2 diabetes mellitus with hyperglycemia, without long-term current use of insulin (HCC)   Acute on chronic combined systolic and diastolic CHF (congestive heart failure) (HCC)   Anemia of chronic disease   Sepsis without acute organ dysfunction (HCC)   PAD (peripheral artery disease) ([place])   Wet gangrene (HCC)   S/P AKA (above knee amputation), right ([place])   Hypotension   Acute delirium  [facility]/MWF/Rt groin Permcath   #1: ESRD: Last dialysis  received on 07/10/21. Patient continues to state he does not want dialysis. Per psychiatry, patient deemed to have capacity to make his own decisions. Will discontinue dialysis and palliative care on board to help with hospice needs.   #2: Right lower extremity osteomyelitis: S/p right lower extremity angiogram.  S/p right AKA on 07/08/21.    #3: Anemia with chronic kidney diease: Hgb 8.2     #4: Secondary hyperparathyroidism: Calcium and phosphorus not at target.  Sevelamer given with meals   #5: Hypotension: Soft BP, prescribed Midodrine.  Asymptomatic with low BPs    LOS: [facility] 9/29/202210:49 AM

## 2021-07-14 DIAGNOSIS — J9601 Acute respiratory failure with hypoxia: Secondary | ICD-10-CM | POA: Diagnosis not present

## 2021-07-14 DIAGNOSIS — A419 Sepsis, unspecified organism: Secondary | ICD-10-CM | POA: Diagnosis not present

## 2021-07-14 DIAGNOSIS — M869 Osteomyelitis, unspecified: Secondary | ICD-10-CM | POA: Diagnosis not present

## 2021-07-14 DIAGNOSIS — R569 Unspecified convulsions: Secondary | ICD-10-CM

## 2021-07-14 LAB — BASIC METABOLIC PANEL
Anion gap: 15 (ref 5–15)
BUN: 58 mg/dL — ABNORMAL HIGH (ref 8–23)
CO2: 23 mmol/L (ref 22–32)
Calcium: 8.4 mg/dL — ABNORMAL LOW (ref 8.9–10.3)
Chloride: 97 mmol/L — ABNORMAL LOW (ref 98–111)
Creatinine, Ser: 9.34 mg/dL — ABNORMAL HIGH (ref 0.61–1.24)
GFR, Estimated: 5 mL/min — ABNORMAL LOW (ref >60)
Glucose, Bld: 129 mg/dL — ABNORMAL HIGH (ref 70–99)
Potassium: 5 mmol/L (ref 3.5–5.1)
Sodium: 135 mmol/L (ref 135–145)

## 2021-07-14 LAB — GLUCOSE, CAPILLARY
Glucose-Capillary: 93 mg/dL (ref 70–99)
Glucose-Capillary: 79 mg/dL (ref 70–99)
Glucose-Capillary: 95 mg/dL (ref 70–99)
Glucose-Capillary: 134 mg/dL — ABNORMAL HIGH (ref 70–99)
Glucose-Capillary: 128 mg/dL — ABNORMAL HIGH (ref 70–99)
Glucose-Capillary: 163 mg/dL — ABNORMAL HIGH (ref 70–99)

## 2021-07-14 LAB — CBC
HCT: 27.1 % — ABNORMAL LOW (ref 39.0–52.0)
Hemoglobin: 8.7 g/dL — ABNORMAL LOW (ref 13.0–17.0)
MCH: 27.4 pg (ref 26.0–34.0)
MCHC: 32.1 g/dL (ref 30.0–36.0)
MCV: 85.2 fL (ref 80.0–100.0)
Platelets: 346 10*3/uL (ref 150–400)
RBC: 3.18 MIL/uL — ABNORMAL LOW (ref 4.22–5.81)
RDW: 18 % — ABNORMAL HIGH (ref 11.5–15.5)
WBC: 9.8 10*3/uL (ref 4.0–10.5)
nRBC: 0 % (ref 0.0–0.2)

## 2021-07-14 MED ORDER — SODIUM CHLORIDE 0.9 % IV BOLUS: 250.0000 mL | Freq: Once | INTRAVENOUS | Status: DC

## 2021-07-14 MED ORDER — HEPARIN SODIUM (PORCINE) 1000 UNIT/ML IJ SOLN
INTRAMUSCULAR | Status: AC
  Filled 2021-07-14: qty 1

## 2021-07-14 MED ORDER — INSULIN ASPART 100 UNIT/ML IJ SOLN
0.0000 [IU] | Freq: Every day | INTRAMUSCULAR | Status: DC
  Filled 2021-07-14: qty 0.05

## 2021-07-14 MED ORDER — INSULIN ASPART 100 UNIT/ML IJ SOLN
0.0000 [IU] | Freq: Three times a day (TID) | INTRAMUSCULAR | Status: DC
  Filled 2021-07-14: qty 0.06

## 2021-07-14 NOTE — Progress Notes (Signed)
OT Cancellation Note  Patient Details Name: Ernest [name] MRN: [id number] DOB: [birth year]   Cancelled Treatment:    Reason Eval/Treat Not Completed: Other (comment) Chart review this date indicates that while pt did agree to resuming HD, his BP was too low to undergo treatment today and he was moved back to ICU for more consistent BP monitoring. Because pt transitioned back to higher level of care, will require new therapy orders per protocol, should he become appropriate to participate. Thank you.   Gerrianne Scale, [place], OTR/L ascom [phone] 07/14/21, 3:32 PM

## 2021-07-14 NOTE — Progress Notes (Signed)
[facility] Daily Progress Note  07/08/21: Right Above The Knee Amputation  Subjective: Patient without complaint this AM.  Resting comfortably.  No acute issues overnight.  Objective: Vitals:   07/14/21 0200 07/14/21 0300 07/14/21 0418 07/14/21 0800  BP: (!) 119/53 (!) 118/48 (!) 138/50 (!) 106/55  Pulse: (!) 57 (!) 57 61 64  Resp: '11 12 18 18  '$ Temp:   98 F (36.7 C) 98 F (36.7 C)  TempSrc:      SpO2: 92% 94% 98% 100%  Weight:      Height:        Intake/Output Summary (Last 24 hours) at 07/14/2021 1105 Last data filed at 07/14/2021 1016 Gross per 24 hour  Intake 240 ml  Output --  Net 240 ml   Physical Exam: A&Ox1, NAD CV: RRR Pulmonary: CTA Bilaterally Abdomen: Soft, Nontender, Nondistended Vascular:             Right lower extremity: Thigh soft. Stump is warm and healthy.  Incision is clean dry and intact.   Laboratory: CBC    Component Value Date/Time   WBC 11.6 (H) 07/12/2021 0520   HGB 8.2 (L) 07/12/2021 0520   HCT 24.8 (L) 07/12/2021 0520   PLT 258 07/12/2021 0520   BMET    Component Value Date/Time   NA 139 07/12/2021 0520   NA 140 12/11/2017 0000   K 4.4 07/12/2021 0520   CL 99 07/12/2021 0520   CO2 24 07/12/2021 0520   GLUCOSE 125 (H) 07/12/2021 0520   BUN 44 (H) 07/12/2021 0520   BUN 16 12/11/2017 0000   CREATININE 6.42 (H) 07/12/2021 0520   CALCIUM 8.7 (L) 07/12/2021 0520   CALCIUM 8.4 (L) 10/05/2017 0615   GFRNONAA 9 (L) 07/12/2021 0520   GFRAA 21 (L) 12/04/2019 2027   Assessment/Planning: The patient is a 75 year old male with multiple medical issues who [facility] the [facility] emergency department from his nursing facility at [facility] with severe wounds to the right foot now status post right above-the-knee amputation - POD #6   1) AM labs pending.  Vitals stable. 2) Physical examination was notable for a warm healthy stump.  Incision is clean dry and intact.  Continue daily dressing  changes. 3) Physical and Occupational Therapy recommending SNF for dispo.  Agree with their assessment.  Would not be safe for the patient to return home. 4) Patient has stated multiple times during his inpatient stay that he would like to stop dialysis and is requesting to return home with hospice.  I believe the plan was to transition to hospice care yesterday however the patient's daughters have chosen to continue with full medical care.  Patient has been made DNR.  5) On aspirin and statin for medical management. 5) From a vascular standpoint, the patient can be discharged when medically stable.  Discharge follow-up placed in the chart. 7) The patient is a week out from his amputation.  No further recommendations from vascular surgery at this time.    Vascular surgery to sign off.   Discussed with Dr. Ellis Parents Edgerrin Correia [name] 07/14/2021 11:05 AM

## 2021-07-14 NOTE — Progress Notes (Signed)
[facility]  PROGRESS NOTE   Subjective:   Patient resting in bed Alert and oriented States he would like to have dialysis today  Objective:  Vital signs in last 24 hours:  Temp:  [98 F (36.7 C)-98.6 F (37 C)] 98 F (36.7 C) (09/30 0800) Pulse Rate:  [57-65] 64 (09/30 0800) Resp:  [11-18] 18 (09/30 0800) BP: (104-138)/(42-87) 106/55 (09/30 0800) SpO2:  [88 %-100 %] 100 % (09/30 0800)  Weight change:  Filed Weights   07/11/21 0500 07/12/21 0500 07/13/21 0500  Weight: 86 kg 86.6 kg 84.6 kg    Intake/Output: No intake/output data recorded.   Intake/Output this shift:  No intake/output data recorded.  Physical Exam: General:  No acute distress  Head:  Normocephalic, atraumatic. Moist oral mucosal membranes  Eyes:  Anicteric  Lungs:   Clear to auscultation, normal effort, [place] O2  Heart:  S1S2 no rubs  Abdomen:   Soft, nontender, bowel sounds present  Extremities:  No peripheral edema.  Right AKA on 07/08/21  Neurologic:  Awake, alert, following commands  Skin:  No lesions  Access: Rt groin Permcath    Basic Metabolic Panel: Recent Labs  Lab 07/08/21 0540 07/08/21 1813 07/09/21 0630 07/10/21 0516 07/11/21 0503 07/12/21 0520  NA 138  --  135 135 135 139  K 3.4*  --  4.8 4.8 4.0 4.4  CL 95*  --  94* 98 98 99  CO2 30  --  20* '24 28 24  '$ GLUCOSE 64*  --  246* 263* 154* 125*  BUN 31*  --  47* 57* 32* 44*  CREATININE 4.39* 5.13* 6.03* 7.39* 4.79* 6.42*  CALCIUM 9.2  --  9.2 9.0 8.4* 8.7*  MG 2.0  --  1.9 2.0 1.7 1.8  PHOS 4.3  --  6.8* 6.8* 4.3 5.2*     CBC: Recent Labs  Lab 07/08/21 1813 07/09/21 0630 07/10/21 0516 07/11/21 0503 07/12/21 0520  WBC 22.7* 23.0* 15.8* 10.2 11.6*  HGB 9.6* 9.0* 8.2* 7.9* 8.2*  HCT 29.9* 29.2* 25.3* 25.0* 24.8*  MCV 87.4 88.5 87.8 86.5 86.4  PLT 335 285 258 216 258      Urinalysis: No results for input(s): COLORURINE, LABSPEC, PHURINE, GLUCOSEU, HGBUR, BILIRUBINUR, KETONESUR, PROTEINUR, UROBILINOGEN,  NITRITE, LEUKOCYTESUR in the last 72 hours.  Invalid input(s): APPERANCEUR    Imaging: No results found.   Medications:      acetaminophen  1,000 mg Oral TID   allopurinol  100 mg Oral Daily   amiodarone  200 mg Oral Daily   aspirin EC  81 mg Oral Daily   atorvastatin  40 mg Oral Daily   Chlorhexidine Gluconate Cloth  6 each Topical Q0600   docusate sodium  100 mg Oral Daily   epoetin (EPOGEN/PROCRIT) injection  10,000 Units Intravenous Q M,W,F-HD   heparin injection (subcutaneous)  5,000 Units Subcutaneous Q8H   insulin aspart  0-9 Units Subcutaneous TID AC & HS   levETIRAcetam  500 mg Oral BID   midodrine  10 mg Oral QID   mirtazapine  15 mg Oral QHS   pantoprazole  40 mg Oral Daily   potassium chloride  20 mEq Oral Once   senna-docusate  1 tablet Oral QHS   sertraline  25 mg Oral QHS   sevelamer carbonate  800 mg Oral TID WC    Assessment/ Plan:      Ernest [name] is a 75 year old male with history of hypertension, coronary artery disease, COPD, congestive heart failure, hepatitis  C, hyperlipidemia, ESRD on hemodialysis Monday Wednesday Friday.  He is now being admitted with history of acute respiratory failure and also sepsis.  Principal Problem:   ESRD (end stage renal disease) ([place]) Active Problems:   Type 2 diabetes mellitus with hyperglycemia, without long-term current use of insulin (HCC)   Acute on chronic combined systolic and diastolic CHF (congestive heart failure) (HCC)   Anemia of chronic disease   Sepsis without acute organ dysfunction (HCC)   PAD (peripheral artery disease) ([place])   Wet gangrene (HCC)   S/P AKA (above knee amputation), right ([place])   Hypotension   Acute delirium  [facility]/MWF/Rt groin Permcath   #1: ESRD: Last dialysis received on 07/10/21. It appears that after speaking with family, patient has decided to continue with dialysis treatments. Dialysis scheduled for today, but patient too hypotensive to proceed. BP  69/53. (60). Pt returned to room, primary team notified. Patient agreeable to re-attempt tomorrow   #2: Right lower extremity osteomyelitis: S/p right lower extremity angiogram.  S/p right AKA on 07/08/21.    #3: Anemia with chronic kidney diease: Hgb 8.2     #4: Secondary hyperparathyroidism: Calcium and phosphorus not at target.  Sevelamer given with meals   #5: Hypotension: Soft BP, prescribed Midodrine.  Recently transferred out of ICU where he was placed on pressors for low blood pressures. BP 69*53 prevented dialysis treatment today, may need to be placed on pressors. Will defer to primary team    LOS: [facility] 9/30/202210:16 AM

## 2021-07-14 NOTE — Progress Notes (Signed)
   07/14/21 1515  Clinical Encounter Type  Visited With Patient and family together  Visit Type Initial;Spiritual support;Social support  Referral From Nurse  Consult/Referral To Chaplain  Spiritual Encounters  Spiritual Needs Other (Comment);Emotional (AD)  Chaplain Burris responded to call from nurse regarding need to complete advanced directive. Chaplain met with Pt's daughter and spoke with her at length regarding overall situation with progression of care needs, recent loss of Pt's spouse/her mother, and siblings in other parts of the [place]. Daughter described concerns that Pt is losing will to seek treatment. Chaplain discussed the advanced directive and attempted to answer any questions. Chaplain spoke with Pt who was quite drowsy at this time. Pt simply shook his head "no" when he was questioned about the form. Daughter says that he has been more talkative/is able to engage verbally. Chaplain indicated she would return in a few hours to see if he could say more and to determine his wishes and ability to execute this document. Chaplain urged daughter to take care of herself; daughter left contact number.

## 2021-07-14 NOTE — Progress Notes (Signed)
Patient ID: Ernest [name], male   DOB: Feb 26, 1946, 75 y.o.   MRN: [id number] [facility] PROGRESS NOTE  KLEBER DIERSEN [id number] DOB: 1946/02/02 DOA: 06/26/2021 PCP: Patient, [name] (Inactive)  HPI/Subjective: Patient this morning was now agreeable to dialysis.  His daughter talked to him yesterday about the situation of refusing dialysis.  Unfortunately when they brought him down to dialysis his blood pressure was too low for them to proceed with dialysis.  He did receive midodrine prior to the dialysis.  Patient offers no complaints.  Initially admitted 17 days ago with sepsis and right heel gangrene.  Objective: Vitals:   07/14/21 1130 07/14/21 1137  BP:  113/85  Pulse:  69  Resp: 12 18  Temp:  98.6 F (37 C)  SpO2:  100%    Intake/Output Summary (Last 24 hours) at 07/14/2021 1226 Last data filed at 07/14/2021 1016 Gross per 24 hour  Intake 240 ml  Output --  Net 240 ml   Filed Weights   07/11/21 0500 07/12/21 0500 07/13/21 0500  Weight: 86 kg 86.6 kg 84.6 kg    ROS: Review of Systems  Respiratory:  Negative for shortness of breath.   Cardiovascular:  Negative for chest pain.  Gastrointestinal:  Negative for abdominal pain.  Exam: Physical Exam HENT:     Head: Normocephalic.     Mouth/Throat:     Pharynx: No oropharyngeal exudate.  Eyes:     General: Lids are normal.     Conjunctiva/sclera: Conjunctivae normal.  Cardiovascular:     Rate and Rhythm: Normal rate and regular rhythm.     Heart sounds: Normal heart sounds, S1 normal and S2 normal.  Pulmonary:     Breath sounds: Examination of the right-lower field reveals decreased breath sounds. Examination of the left-lower field reveals decreased breath sounds. Decreased breath sounds present. No wheezing, rhonchi or rales.  Abdominal:     Palpations: Abdomen is soft.     Tenderness: There is no abdominal tenderness.  Musculoskeletal:     Left foot: Swelling present.  Skin:    General: Skin is  warm.     Findings: No rash.  Neurological:     Mental Status: He is alert.     Comments: Answer some yes/no questions but does not elaborate.      Scheduled Meds:  acetaminophen  1,000 mg Oral TID   allopurinol  100 mg Oral Daily   amiodarone  200 mg Oral Daily   aspirin EC  81 mg Oral Daily   atorvastatin  40 mg Oral Daily   Chlorhexidine Gluconate Cloth  6 each Topical Q0600   docusate sodium  100 mg Oral Daily   epoetin (EPOGEN/PROCRIT) injection  10,000 Units Intravenous Q M,W,F-HD   heparin injection (subcutaneous)  5,000 Units Subcutaneous Q8H   levETIRAcetam  500 mg Oral BID   midodrine  10 mg Oral QID   mirtazapine  15 mg Oral QHS   pantoprazole  40 mg Oral Daily   potassium chloride  20 mEq Oral Once   senna-docusate  1 tablet Oral QHS   sertraline  25 mg Oral QHS   sevelamer carbonate  800 mg Oral TID WC   Assessment/Plan:  Hypotension.  Patient on midodrine.  Patient was taken down for dialysis and blood pressure very low and unable to do dialysis.  Nephrology recommended transferring back to the stepdown unit.  We will repeat blood pressures.  May end up needing pressors and continuous dialysis and or  dialysis while on pressors.  CBC and BMP ordered.  We will give 1 fluid bolus. End-stage renal disease.  For the past 2 days the patient has refused dialysis.  Patient agreeable to dialysis today but blood pressure too low. Sepsis with gangrene of the right heel.  Patient had right AKA on 07/08/2021.  Off antibiotics at this point. Anemia of chronic disease.  Last hemoglobin was 8.2.  Checking hemoglobin today. Chronic combined systolic and diastolic congestive heart failure.  Patient refusing dialysis at this point.  Blood pressure too low for medications. Type 2 diabetes mellitus with hyperglycemia last hemoglobin A1c 8.4.  All sugars have been good over the last 24 hours.  I am wondering how much he is eating. Peripheral vascular disease and history of CAD on aspirin  and statin History of seizure on Keppra History of arrhythmia on amiodarone    Code Status:     Code Status Orders  (From admission, onward)           Start     Ordered   07/13/21 1456  Do not attempt resuscitation (DNR)  Continuous       Question Answer Comment  In the event of cardiac or respiratory ARREST Do not call a "code blue"   In the event of cardiac or respiratory ARREST Do not perform Intubation, CPR, defibrillation or ACLS   In the event of cardiac or respiratory ARREST Use medication by any route, position, wound care, and other measures to relive pain and suffering. May use oxygen, suction and manual treatment of airway obstruction as needed for comfort.   Comments MOST form in [place]      07/13/21 1455           Code Status History     Date Active Date Inactive Code Status Order ID Comments User Context   07/08/2021 1625 07/13/2021 1455 Full Code [id number]  Elmore Guise, MD Inpatient   06/27/2021 0250 07/08/2021 1456 Full Code [id number]  Christel Mormon, MD ED   09/30/2019 1657 09/30/2019 2058 Full Code [id number]  Ina Homes, MD ED   09/08/2017 0359 10/07/2017 2150 Full Code [id number]  Collier Salina, MD ED   10/27/2016 2044 10/29/2016 2121 Full Code [id number]  Zada Finders, MD Inpatient   07/13/2015 1652 07/21/2015 1336 Full Code [id number]  Cathlyn Parsons, [name] Inpatient   07/11/2015 1629 07/13/2015 1652 Full Code [id number]  Juluis Mire, MD ED   04/29/2015 1352 04/30/2015 2038 Full Code [id number]  Lucious Groves, DO Inpatient   08/27/2013 0427 08/27/2013 1845 Full Code [id number]  Mal Misty, MD Inpatient      Family Communication: Spoke with daughter on the phone Disposition Plan: Status is: Inpatient  Dispo: The patient is from: Rehab              Anticipated d/c is to: Rehab              Patient currently with hypotension before starting dialysis.  Not stable to do dialysis currently.   Difficult to place patient.  Hopefully  not.  Consultants: Vascular surgery Nephrology Palliative care  Procedures: Right AKA  Time spent: 27 minutes, case discussed with nephrology and palliative care  [place]

## 2021-07-15 LAB — BASIC METABOLIC PANEL
Anion gap: 13 (ref 5–15)
BUN: 43 mg/dL — ABNORMAL HIGH (ref 8–23)
CO2: 26 mmol/L (ref 22–32)
Calcium: 8.5 mg/dL — ABNORMAL LOW (ref 8.9–10.3)
Chloride: 94 mmol/L — ABNORMAL LOW (ref 98–111)
Creatinine, Ser: 6.93 mg/dL — ABNORMAL HIGH (ref 0.61–1.24)
GFR, Estimated: 8 mL/min — ABNORMAL LOW (ref >60)
Glucose, Bld: 128 mg/dL — ABNORMAL HIGH (ref 70–99)
Potassium: 4.5 mmol/L (ref 3.5–5.1)
Sodium: 133 mmol/L — ABNORMAL LOW (ref 135–145)

## 2021-07-15 LAB — CBC
HCT: 25.8 % — ABNORMAL LOW (ref 39.0–52.0)
Hemoglobin: 8.3 g/dL — ABNORMAL LOW (ref 13.0–17.0)
MCH: 27.7 pg (ref 26.0–34.0)
MCHC: 32.2 g/dL (ref 30.0–36.0)
MCV: 86 fL (ref 80.0–100.0)
Platelets: 325 10*3/uL (ref 150–400)
RBC: 3 MIL/uL — ABNORMAL LOW (ref 4.22–5.81)
RDW: 18.3 % — ABNORMAL HIGH (ref 11.5–15.5)
WBC: 10.1 10*3/uL (ref 4.0–10.5)
nRBC: 0 % (ref 0.0–0.2)

## 2021-07-15 LAB — GLUCOSE, CAPILLARY
Glucose-Capillary: 100 mg/dL — ABNORMAL HIGH (ref 70–99)
Glucose-Capillary: 120 mg/dL — ABNORMAL HIGH (ref 70–99)
Glucose-Capillary: 96 mg/dL (ref 70–99)
Glucose-Capillary: 95 mg/dL (ref 70–99)

## 2021-07-15 NOTE — Progress Notes (Signed)
Ernest [name] ID: Ernest [name], male   DOB: [birth year], 75 y.o.   MRN: [id number] [facility] PROGRESS NOTE  THARUN PUCK [id number] DOB: 31-May-1946 DOA: 06/26/2021 PCP: Ernest [name], [name] (Inactive)  HPI/Subjective: Ernest [name] is a man of few words.  Answer some yes or no questions.  He is still interested in doing dialysis.  Offers no complaints.  Initially admitted 18 days ago with sepsis and gangrene of the right heel  Objective: Vitals:   07/15/21 0500 07/15/21 0600  BP: 114/84 (!) 113/53  Pulse:  65  Resp: 11 14  Temp:    SpO2:  100%    Intake/Output Summary (Last 24 hours) at 07/15/2021 1615 Last data filed at 07/14/2021 2300 Gross per 24 hour  Intake 240 ml  Output -98 ml  Net 338 ml   Filed Weights   07/12/21 0500 07/13/21 0500 07/15/21 0500  Weight: 86.6 kg 84.6 kg 82 kg    ROS: Review of Systems  Respiratory:  Negative for shortness of breath.   Cardiovascular:  Negative for chest pain.  Gastrointestinal:  Negative for abdominal pain, nausea and vomiting.  Exam: Physical Exam HENT:     Head: Normocephalic.     Mouth/Throat:     Pharynx: No oropharyngeal exudate.  Eyes:     General: Lids are normal.     Conjunctiva/sclera: Conjunctivae normal.  Cardiovascular:     Rate and Rhythm: Normal rate and regular rhythm.     Heart sounds: Normal heart sounds, S1 normal and S2 normal.  Pulmonary:     Breath sounds: No decreased breath sounds, wheezing, rhonchi or rales.  Abdominal:     Palpations: Abdomen is soft.     Tenderness: There is no abdominal tenderness.  Musculoskeletal:     Left lower leg: No swelling.  Skin:    General: Skin is warm.     Findings: No rash.  Neurological:     Mental Status: He is alert.     Comments: Answer some simple yes/no questions.      Scheduled Meds:  acetaminophen  1,000 mg Oral TID   allopurinol  100 mg Oral Daily   amiodarone  200 mg Oral Daily   aspirin EC  81 mg Oral Daily   atorvastatin  40 mg Oral Daily    Chlorhexidine Gluconate Cloth  6 each Topical Q0600   docusate sodium  100 mg Oral Daily   epoetin (EPOGEN/PROCRIT) injection  10,000 Units Intravenous Q M,W,F-HD   heparin injection (subcutaneous)  5,000 Units Subcutaneous Q8H   insulin aspart  0-5 Units Subcutaneous QHS   insulin aspart  0-6 Units Subcutaneous TID WC   levETIRAcetam  500 mg Oral BID   midodrine  10 mg Oral QID   mirtazapine  15 mg Oral QHS   pantoprazole  40 mg Oral Daily   senna-docusate  1 tablet Oral QHS   sertraline  25 mg Oral QHS   sevelamer carbonate  800 mg Oral TID WC   Continuous Infusions:  sodium chloride      Assessment/Plan:  Hypotension.  Ernest [name] on midodrine.  Initially yesterday blood pressure was too low to do dialysis.  Once the Ernest [name] got to the stepdown unit Ernest [name] was able to hold his blood pressures and they did a few hours of dialysis last night.  Transfer out of stepdown unit. End-stage renal disease.  Ernest [name] able to do a couple hours of dialysis last night.  Nephrology will do dialysis on Monday. Sepsis with gangrene and  osteomyelitis of the right heel.  Ernest [name] had right AKA on 07/08/2021.  Ernest [name] is off antibiotics at this point. Anemia of chronic disease.  Today's hemoglobin 8.3. Chronic combined systolic and diastolic congestive heart failure.  Ernest [name] agreeable to dialysis and I will be the only way to manage fluid secondary to hypotension. Type 2 diabetes mellitus with hyperglycemia.  Hemoglobin A1c elevated 8.4 but sugars not consistent with that level.  A1c may be falsely elevated with end-stage kidney disease.  Continue sliding scale insulin for now. Seizure disorder on Keppra Arrhythmia history of amiodarone Peripheral vascular disease and CAD on aspirin and statin Secondary hyperparathyroidism        Code Status:     Code Status Orders  (From admission, onward)           Start     Ordered   07/13/21 1456  Do not attempt resuscitation (DNR)  Continuous        Question Answer Comment  In the event of cardiac or respiratory ARREST Do not call a "code blue"   In the event of cardiac or respiratory ARREST Do not perform Intubation, CPR, defibrillation or ACLS   In the event of cardiac or respiratory ARREST Use medication by any route, position, wound care, and other measures to relive pain and suffering. May use oxygen, suction and manual treatment of airway obstruction as needed for comfort.   Comments MOST form in [place]      07/13/21 1455           Code Status History     Date Active Date Inactive Code Status Order ID Comments User Context   07/08/2021 1625 07/13/2021 1455 Full Code [id number]  Elmore Guise, MD Inpatient   06/27/2021 0250 07/08/2021 1456 Full Code [id number]  Christel Mormon, MD ED   09/30/2019 1657 09/30/2019 2058 Full Code [id number]  Ina Homes, MD ED   09/08/2017 0359 10/07/2017 2150 Full Code [id number]  Collier Salina, MD ED   10/27/2016 2044 10/29/2016 2121 Full Code [id number]  Zada Finders, MD Inpatient   07/13/2015 1652 07/21/2015 1336 Full Code [id number]  Cathlyn Parsons, [name] Inpatient   07/11/2015 1629 07/13/2015 1652 Full Code [id number]  Juluis Mire, MD ED   04/29/2015 1352 04/30/2015 2038 Full Code [id number]  Lucious Groves, DO Inpatient   08/27/2013 0427 08/27/2013 1845 Full Code [id number]  Mal Misty, MD Inpatient      Family Communication: Spoke with daughter on the phone Disposition Plan: Status is: Inpatient  Dispo: The Ernest [name] is from: Rehab              Anticipated d/c is to: Rehab              Ernest [name] currently would like to see how he tolerates dialysis on Monday with respect to his blood pressure.   Difficult to place Ernest [name].  No  Time spent: 26 minutes.  [place]  [facility] [employer]

## 2021-07-15 NOTE — Progress Notes (Signed)
[facility]  PROGRESS NOTE   Subjective:   Patient resting comfortably on the bed Patient was seen in ICU today Patient offers no new specific physical complaints.  Objective:  Vital signs in last 24 hours:  Temp:  [98.1 F (36.7 C)-98.7 F (37.1 C)] 98.7 F (37.1 C) (10/01 0400) Pulse Rate:  [56-71] 65 (10/01 0600) Resp:  [11-24] 14 (10/01 0600) BP: (80-157)/(33-132) 113/53 (10/01 0600) SpO2:  [92 %-100 %] 100 % (10/01 0600) Weight:  [82 kg] 82 kg (10/01 0500)  Weight change:  Filed Weights   07/12/21 0500 07/13/21 0500 07/15/21 0500  Weight: 86.6 kg 84.6 kg 82 kg    Intake/Output: I/O last 3 completed shifts: In: 480 [P.O.:480] Out: -98    Intake/Output this shift:  No intake/output data recorded.  Physical Exam: General:  No acute distress  Head:  Normocephalic, atraumatic. Moist oral mucosal membranes  Eyes:  Anicteric  Lungs:   Clear to auscultation, normal effort, [place] O2  Heart:  S1S2 no rubs  Abdomen:   Soft, nontender, bowel sounds present  Extremities:  No peripheral edema.  Right AKA on 07/08/21  Neurologic:  Awake, alert, following commands  Skin:  No lesions  Access: Rt groin Permcath    Basic Metabolic Panel: Recent Labs  Lab 07/09/21 0630 07/10/21 0516 07/11/21 0503 07/12/21 0520 07/14/21 1303 07/15/21 1154  NA 135 135 135 139 135 133*  K 4.8 4.8 4.0 4.4 5.0 4.5  CL 94* 98 98 99 97* 94*  CO2 20* '24 28 24 23 26  '$ GLUCOSE 246* 263* 154* 125* 129* 128*  BUN 47* 57* 32* 44* 58* 43*  CREATININE 6.03* 7.39* 4.79* 6.42* 9.34* 6.93*  CALCIUM 9.2 9.0 8.4* 8.7* 8.4* 8.5*  MG 1.9 2.0 1.7 1.8  --   --   PHOS 6.8* 6.8* 4.3 5.2*  --   --     CBC: Recent Labs  Lab 07/10/21 0516 07/11/21 0503 07/12/21 0520 07/14/21 1303 07/15/21 1154  WBC 15.8* 10.2 11.6* 9.8 10.1  HGB 8.2* 7.9* 8.2* 8.7* 8.3*  HCT 25.3* 25.0* 24.8* 27.1* 25.8*  MCV 87.8 86.5 86.4 85.2 86.0  PLT 258 216 258 346 325     Urinalysis: No results for input(s):  COLORURINE, LABSPEC, PHURINE, GLUCOSEU, HGBUR, BILIRUBINUR, KETONESUR, PROTEINUR, UROBILINOGEN, NITRITE, LEUKOCYTESUR in the last 72 hours.  Invalid input(s): APPERANCEUR    Imaging: No results found.   Medications:    sodium chloride      acetaminophen  1,000 mg Oral TID   allopurinol  100 mg Oral Daily   amiodarone  200 mg Oral Daily   aspirin EC  81 mg Oral Daily   atorvastatin  40 mg Oral Daily   Chlorhexidine Gluconate Cloth  6 each Topical Q0600   docusate sodium  100 mg Oral Daily   epoetin (EPOGEN/PROCRIT) injection  10,000 Units Intravenous Q M,W,F-HD   heparin injection (subcutaneous)  5,000 Units Subcutaneous Q8H   insulin aspart  0-5 Units Subcutaneous QHS   insulin aspart  0-6 Units Subcutaneous TID WC   levETIRAcetam  500 mg Oral BID   midodrine  10 mg Oral QID   mirtazapine  15 mg Oral QHS   pantoprazole  40 mg Oral Daily   senna-docusate  1 tablet Oral QHS   sertraline  25 mg Oral QHS   sevelamer carbonate  800 mg Oral TID WC    Assessment/ Plan:      Ernest [name] is a 75 year old male with history  of hypertension, coronary artery disease, COPD, congestive heart failure, hepatitis C, hyperlipidemia, ESRD on hemodialysis Monday Wednesday Friday.  He is now being admitted with history of acute respiratory failure and also sepsis.  Principal Problem:   ESRD (end stage renal disease) ([place]) Active Problems:   Type 2 diabetes mellitus with hyperglycemia, without long-term current use of insulin (HCC)   Seizure (HCC)   Acute on chronic combined systolic and diastolic CHF (congestive heart failure) (HCC)   Anemia of chronic disease   Sepsis (HCC)   PAD (peripheral artery disease) (HCC)   Wet gangrene (HCC)   S/P AKA (above knee amputation), right (HCC)   Hypotension   Acute delirium  [facility]/MWF/Rt groin Permcath       1)Renal    End-stage renal disease Patient is on hemodialysis Patient is on Monday Wednesday Friday as an  outpatient Early during the stay patient was wondering about stopping dialysis but patient later after discussion with his family wanted to continue renal placement therapy Patient was last dialyzed yesterday No need for renal placement therapy today   2)Hypotension Patient is on midodrine Patient is stable  3)Anemia of chronic disease  CBC Latest Ref Rng & Units 07/15/2021 07/14/2021 07/12/2021  WBC 4.0 - 10.5 K/uL 10.1 9.8 11.6(H)  Hemoglobin 13.0 - 17.0 g/dL 8.3(L) 8.7(L) 8.2(L)  Hematocrit 39.0 - 52.0 % 25.8(L) 27.1(L) 24.8(L)  Platelets 150 - 400 K/uL 325 346 46       HGb is not at goal (9--11) We will keep patient on Epogen  4) Secondary hyperparathyroidism -CKD Mineral-Bone Disorder    Lab Results  Component Value Date   PTH 63 10/05/2017   PTH Comment 10/05/2017   CALCIUM 8.5 (L) 07/15/2021   CAION 1.05 (L) 07/12/2020   PHOS 5.2 (H) 07/12/2021    Secondary Hyperparathyroidism present   Phosphorus at goal.   5)Right lower extremity osteomyelitis: S/p right lower extremity angiogram.  S/p right AKA on 07/08/21.     6) Electrolytes   BMP Latest Ref Rng & Units 07/15/2021 07/14/2021 07/12/2021  Glucose 70 - 99 mg/dL 128(H) 129(H) 125(H)  BUN 8 - 23 mg/dL 43(H) 58(H) 44(H)  Creatinine 0.61 - 1.24 mg/dL 6.93(H) 9.34(H) 6.42(H)  Sodium 135 - 145 mmol/L 133(L) 135 139  Potassium 3.5 - 5.1 mmol/L 4.5 5.0 4.4  Chloride 98 - 111 mmol/L 94(L) 97(L) 99  CO2 22 - 32 mmol/L '26 23 24  '$ Calcium 8.9 - 10.3 mg/dL 8.5(L) 8.4(L) 8.7(L)     Sodium Hyponatremia Secondary to ESRD We will follow   Potassium Normokalemic    7)Acid base   Co2 at goal    Plan   No need for renal placement therapy today    LOS: 18 Rayen Palen s [facility] 10/1/20222:34 PM

## 2021-07-15 NOTE — Progress Notes (Signed)
Pt. with left femoral catheter tolerated tx well, with few drops in B/P. Pt was medicated with Midodrine and UF turned for short period of time, but maintained a MAP >55. CVC function well BFR 400, Epogen given.

## 2021-07-16 DIAGNOSIS — L8990 Pressure ulcer of unspecified site, unspecified stage: Secondary | ICD-10-CM | POA: Diagnosis present

## 2021-07-16 LAB — GLUCOSE, CAPILLARY
Glucose-Capillary: 69 mg/dL — ABNORMAL LOW (ref 70–99)
Glucose-Capillary: 72 mg/dL (ref 70–99)
Glucose-Capillary: 112 mg/dL — ABNORMAL HIGH (ref 70–99)
Glucose-Capillary: 138 mg/dL — ABNORMAL HIGH (ref 70–99)
Glucose-Capillary: 102 mg/dL — ABNORMAL HIGH (ref 70–99)

## 2021-07-16 NOTE — Progress Notes (Signed)
[facility]  PROGRESS NOTE   Subjective:   Patient resting comfortably on the bed Patient was seen on second floor today Patient offers no new specific physical complaints.  Objective:  Vital signs in last 24 hours:  Temp:  [97.8 F (36.6 C)-98.2 F (36.8 C)] 97.8 F (36.6 C) (10/02 0352) Pulse Rate:  [52-59] 58 (10/02 0352) Resp:  [11-21] 16 (10/02 0352) BP: (115-133)/(40-97) 115/80 (10/02 0352) SpO2:  [97 %-100 %] 100 % (10/02 0352) Weight:  [83.3 kg] 83.3 kg (10/02 0316)  Weight change: 1.3 kg Filed Weights   07/13/21 0500 07/15/21 0500 07/16/21 0316  Weight: 84.6 kg 82 kg 83.3 kg    Intake/Output: I/O last 3 completed shifts: In: -  Out: -98    Intake/Output this shift:  No intake/output data recorded.  Physical Exam: General:  No acute distress  Head:  Normocephalic, atraumatic. Moist oral mucosal membranes  Eyes:  Anicteric  Lungs:   Clear to auscultation, normal effort, [place] O2  Heart:  S1S2 no rubs  Abdomen:   Soft, nontender, bowel sounds present  Extremities:  No peripheral edema.  Right AKA-bandages in situ  Neurologic:  Awake, alert, following commands  Skin:  No lesions  Access: Rt groin Permcath    Basic Metabolic Panel: Recent Labs  Lab 07/10/21 0516 07/11/21 0503 07/12/21 0520 07/14/21 1303 07/15/21 1154  NA 135 135 139 135 133*  K 4.8 4.0 4.4 5.0 4.5  CL 98 98 99 97* 94*  CO2 '24 28 24 23 26  '$ GLUCOSE 263* 154* 125* 129* 128*  BUN 57* 32* 44* 58* 43*  CREATININE 7.39* 4.79* 6.42* 9.34* 6.93*  CALCIUM 9.0 8.4* 8.7* 8.4* 8.5*  MG 2.0 1.7 1.8  --   --   PHOS 6.8* 4.3 5.2*  --   --     CBC: Recent Labs  Lab 07/10/21 0516 07/11/21 0503 07/12/21 0520 07/14/21 1303 07/15/21 1154  WBC 15.8* 10.2 11.6* 9.8 10.1  HGB 8.2* 7.9* 8.2* 8.7* 8.3*  HCT 25.3* 25.0* 24.8* 27.1* 25.8*  MCV 87.8 86.5 86.4 85.2 86.0  PLT 258 216 258 346 325     Urinalysis: No results for input(s): COLORURINE, LABSPEC, PHURINE, GLUCOSEU, HGBUR,  BILIRUBINUR, KETONESUR, PROTEINUR, UROBILINOGEN, NITRITE, LEUKOCYTESUR in the last 72 hours.  Invalid input(s): APPERANCEUR    Imaging: No results found.   Medications:    sodium chloride      acetaminophen  1,000 mg Oral TID   allopurinol  100 mg Oral Daily   amiodarone  200 mg Oral Daily   aspirin EC  81 mg Oral Daily   atorvastatin  40 mg Oral Daily   Chlorhexidine Gluconate Cloth  6 each Topical Q0600   docusate sodium  100 mg Oral Daily   epoetin (EPOGEN/PROCRIT) injection  10,000 Units Intravenous Q M,W,F-HD   heparin injection (subcutaneous)  5,000 Units Subcutaneous Q8H   insulin aspart  0-5 Units Subcutaneous QHS   insulin aspart  0-6 Units Subcutaneous TID WC   levETIRAcetam  500 mg Oral BID   midodrine  10 mg Oral QID   mirtazapine  15 mg Oral QHS   pantoprazole  40 mg Oral Daily   senna-docusate  1 tablet Oral QHS   sertraline  25 mg Oral QHS   sevelamer carbonate  800 mg Oral TID WC    Assessment/ Plan:      Ernest [name] is a 75 year old male with history of hypertension, coronary artery disease, COPD, congestive heart failure, hepatitis  C, hyperlipidemia, ESRD on hemodialysis Monday Wednesday Friday.  He is now being admitted with history of acute respiratory failure and also sepsis.  Principal Problem:   ESRD (end stage renal disease) ([place]) Active Problems:   Type 2 diabetes mellitus with hyperglycemia, without long-term current use of insulin (HCC)   Seizure (HCC)   Acute on chronic combined systolic and diastolic CHF (congestive heart failure) (HCC)   Anemia of chronic disease   Sepsis (HCC)   PAD (peripheral artery disease) (HCC)   Wet gangrene (HCC)   S/P AKA (above knee amputation), right (HCC)   Hypotension   Acute delirium   Pressure injury of skin  [facility]/MWF/Rt groin Permcath       1)Renal    End-stage renal disease Patient is on hemodialysis Patient is on Monday Wednesday Friday as an outpatient Early during  the stay patient was wondering about stopping dialysis but patient later after discussion with his family wanted to continue renal placement therapy Patient was last dialyzed on Friday No need for renal placement therapy today   2)Hypotension Patient is on midodrine Patient is stable  3)Anemia of chronic disease  CBC Latest Ref Rng & Units 07/15/2021 07/14/2021 07/12/2021  WBC 4.0 - 10.5 K/uL 10.1 9.8 11.6(H)  Hemoglobin 13.0 - 17.0 g/dL 8.3(L) 8.7(L) 8.2(L)  Hematocrit 39.0 - 52.0 % 25.8(L) 27.1(L) 24.8(L)  Platelets 150 - 400 K/uL 325 346 77       HGb is not at goal (9--11) We will keep patient on Epogen  4) Secondary hyperparathyroidism -CKD Mineral-Bone Disorder    Lab Results  Component Value Date   PTH 63 10/05/2017   PTH Comment 10/05/2017   CALCIUM 8.5 (L) 07/15/2021   CAION 1.05 (L) 07/12/2020   PHOS 5.2 (H) 07/12/2021    Secondary Hyperparathyroidism present   Phosphorus at goal.   5)Right lower extremity osteomyelitis: S/p right lower extremity angiogram.  S/p right AKA on 07/08/21.     6) Electrolytes   BMP Latest Ref Rng & Units 07/15/2021 07/14/2021 07/12/2021  Glucose 70 - 99 mg/dL 128(H) 129(H) 125(H)  BUN 8 - 23 mg/dL 43(H) 58(H) 44(H)  Creatinine 0.61 - 1.24 mg/dL 6.93(H) 9.34(H) 6.42(H)  Sodium 135 - 145 mmol/L 133(L) 135 139  Potassium 3.5 - 5.1 mmol/L 4.5 5.0 4.4  Chloride 98 - 111 mmol/L 94(L) 97(L) 99  CO2 22 - 32 mmol/L '26 23 24  '$ Calcium 8.9 - 10.3 mg/dL 8.5(L) 8.4(L) 8.7(L)     Sodium Hyponatremia Secondary to ESRD We will follow   Potassium Normokalemic    7)Acid base   Co2 at goal    Plan   We will dialyze patient the morning No need for renal placement therapy today    LOS: [place] [facility] 10/2/20228:57 AM

## 2021-07-16 NOTE — Progress Notes (Signed)
Patient ID: Ernest [name], male   DOB: Jul 30, 1946, 75 y.o.   MRN: [id number] [facility] PROGRESS NOTE  Ernest [name] [id number] DOB: 09-Jan-1946 DOA: 06/26/2021 PCP: Patient, [name] (Inactive)  HPI/Subjective: Patient answers some simple yes or no questions.  Offers no complaints.  Interested in doing dialysis at this point.  Admitted 19 days ago with sepsis and gangrene and osteomyelitis of the right heel.   Objective: Vitals:   07/16/21 0300 07/16/21 0352  BP: (!) 124/49 115/80  Pulse: (!) 52 (!) 58  Resp: 11 16  Temp:  97.8 F (36.6 C)  SpO2: 100% 100%    Intake/Output Summary (Last 24 hours) at 07/16/2021 1246 Last data filed at 07/16/2021 1010 Gross per 24 hour  Intake 240 ml  Output --  Net 240 ml    Filed Weights   07/13/21 0500 07/15/21 0500 07/16/21 0316  Weight: 84.6 kg 82 kg 83.3 kg    ROS: Review of Systems  Respiratory:  Negative for shortness of breath.   Cardiovascular:  Negative for chest pain.  Gastrointestinal:  Negative for abdominal pain, nausea and vomiting.  Exam: Physical Exam HENT:     Head: Normocephalic.     Mouth/Throat:     Pharynx: No oropharyngeal exudate.  Eyes:     General: Lids are normal.     Conjunctiva/sclera: Conjunctivae normal.  Cardiovascular:     Rate and Rhythm: Normal rate and regular rhythm.     Heart sounds: Normal heart sounds, S1 normal and S2 normal.  Pulmonary:     Breath sounds: No decreased breath sounds, wheezing, rhonchi or rales.  Abdominal:     Palpations: Abdomen is soft.     Tenderness: There is no abdominal tenderness.  Musculoskeletal:     Left lower leg: No swelling.  Skin:    General: Skin is warm.     Findings: No rash.  Neurological:     Mental Status: He is alert.     Comments: Answer some yes/no questions but does not elaborate.      Scheduled Meds:  acetaminophen  1,000 mg Oral TID   allopurinol  100 mg Oral Daily   amiodarone  200 mg Oral Daily   aspirin EC  81 mg Oral  Daily   atorvastatin  40 mg Oral Daily   Chlorhexidine Gluconate Cloth  6 each Topical Q0600   docusate sodium  100 mg Oral Daily   epoetin (EPOGEN/PROCRIT) injection  10,000 Units Intravenous Q M,W,F-HD   heparin injection (subcutaneous)  5,000 Units Subcutaneous Q8H   insulin aspart  0-5 Units Subcutaneous QHS   insulin aspart  0-6 Units Subcutaneous TID WC   levETIRAcetam  500 mg Oral BID   midodrine  10 mg Oral QID   mirtazapine  15 mg Oral QHS   pantoprazole  40 mg Oral Daily   senna-docusate  1 tablet Oral QHS   sertraline  25 mg Oral QHS   sevelamer carbonate  800 mg Oral TID WC     Assessment/Plan:  Hypotension.  Continue midodrine.  Blood pressure stable.   End-stage renal disease.  We will see how he tolerates dialysis tomorrow.  Case discussed with nephrology. Sepsis, present on admission with gangrene and osteomyelitis of the right heel.  Patient is status post amputation with right AKA on 07/08/2021. Anemia of chronic disease.  Yesterday's hemoglobin 8.3. Chronic combined systolic and diastolic congestive heart failure.  Dialysis is the only way to manage fluid secondary to hypotension and  being on midodrine.  Limited with cardiac medication secondary to hypotension. Type 2 diabetes mellitus with hyperglycemia.  Hemoglobin A1c elevated 8.4 but all sugars have been good.  Hemoglobin A1c may be falsely elevated with end-stage renal disease. Seizure disorder continue Keppra. Arrhythmia history on amiodarone CAD and peripheral vascular disease on aspirin and statin Secondary hyperparathyroidism Stage II sacral decubiti, present on admission.  See full description below   Pressure Injury 07/16/21 Sacrum Medial;Lower Stage 2 -  Partial thickness loss of dermis presenting as a shallow open injury with a red, pink wound bed without slough. (Active)  07/16/21 0430  Location: Sacrum  Location Orientation: Medial;Lower  Staging: Stage 2 -  Partial thickness loss of dermis  presenting as a shallow open injury with a red, pink wound bed without slough.  Wound Description (Comments):   Present on Admission: Yes       Code Status:     Code Status Orders  (From admission, onward)           Start     Ordered   07/13/21 1456  Do not attempt resuscitation (DNR)  Continuous       Question Answer Comment  In the event of cardiac or respiratory ARREST Do not call a "code blue"   In the event of cardiac or respiratory ARREST Do not perform Intubation, CPR, defibrillation or ACLS   In the event of cardiac or respiratory ARREST Use medication by any route, position, wound care, and other measures to relive pain and suffering. May use oxygen, suction and manual treatment of airway obstruction as needed for comfort.   Comments MOST form in [place]      07/13/21 1455           Code Status History     Date Active Date Inactive Code Status Order ID Comments User Context   07/08/2021 1625 07/13/2021 1455 Full Code [id number]  Elmore Guise, MD Inpatient   06/27/2021 0250 07/08/2021 1456 Full Code [id number]  Christel Mormon, MD ED   09/30/2019 1657 09/30/2019 2058 Full Code [id number]  Ina Homes, MD ED   09/08/2017 0359 10/07/2017 2150 Full Code [id number]  Collier Salina, MD ED   10/27/2016 2044 10/29/2016 2121 Full Code [id number]  Zada Finders, MD Inpatient   07/13/2015 1652 07/21/2015 1336 Full Code [id number]  Cathlyn Parsons, [name] Inpatient   07/11/2015 1629 07/13/2015 1652 Full Code [id number]  Juluis Mire, MD ED   04/29/2015 1352 04/30/2015 2038 Full Code [id number]  Lucious Groves, DO Inpatient   08/27/2013 0427 08/27/2013 1845 Full Code [id number]  Mal Misty, MD Inpatient      Family Communication: Spoke with daughter on the phone yesterday. Disposition Plan: Status is: Inpatient  Dispo: The patient is from: Rehab              Anticipated d/c is to: Rehab              Patient currently will need to see if he tolerates a full dialysis session  with his hypotension and transitional care team will still have to find a place for him to go.   Difficult to place patient.  No  Time spent: 25 minutes.  Case discussed with nephrology  Loletha Grayer  [facility]

## 2021-07-17 LAB — GLUCOSE, CAPILLARY
Glucose-Capillary: 78 mg/dL (ref 70–99)
Glucose-Capillary: 88 mg/dL (ref 70–99)
Glucose-Capillary: 74 mg/dL (ref 70–99)
Glucose-Capillary: 81 mg/dL (ref 70–99)

## 2021-07-17 MED ORDER — HEPARIN SODIUM (PORCINE) 1000 UNIT/ML IJ SOLN
INTRAMUSCULAR | Status: AC
  Filled 2021-07-17: qty 1

## 2021-07-17 NOTE — NC FL2 (Addendum)
[place] LEVEL OF CARE SCREENING TOOL     IDENTIFICATION  Patient Name: Ernest [name] Birthdate: May 16, 1946 Sex: male Admission Date (Current Location): 06/26/2021  [facility] and [place] Number:  Engineering geologist and Address:  [facility], [address] [place], [place] [address]      Provider Number: [id number]  Attending Physician Name and Address:  Loletha Grayer, MD  Relative Name and Phone Number:       Current Level of Care: Hospital Recommended Level of Care: [place] Prior Approval Number:    Date Approved/Denied:   PASRR Number: [id number]  Discharge Plan: SNF    Current Diagnoses: Patient Active Problem List   Diagnosis Date Noted   Pressure injury of skin 07/16/2021   Hypotension    Acute delirium    Wet gangrene ([place]) 07/10/2021   S/P AKA (above knee amputation), right ([place]) 07/10/2021   PAD (peripheral artery disease) ([place]) 07/08/2021   Sepsis ([place]) 06/27/2021   Disorder of phosphorus metabolism, unspecified 02/20/2021   Hypercalcemia 12/08/2020   Atherosclerotic heart disease of native coronary artery without angina pectoris 11/18/2020   Diarrhea, unspecified 11/18/2020   Fever, unspecified 11/18/2020   Other fluid overload 11/18/2020   Pleural effusion on left 09/30/2019   Acute respiratory failure with hypoxia ([place]) 09/30/2019   Pleural effusion 09/30/2019   Chest pain 12/02/2018   Vascular dementia ([place]) 10/10/2017   Paroxysmal atrial flutter ([place]) 10/10/2017   Altered mental status, unspecified 10/09/2017   Other malaise 10/09/2017   S/P CABG x 4 10/04/2017   Cerebral embolism with cerebral infarction 09/20/2017   NSVT (nonsustained ventricular tachycardia)    Dyspnea 09/08/2017   Non-ST elevation MI (NSTEMI) ([place])    Acute on chronic combined systolic and diastolic CHF (congestive heart failure) ([place])    End-stage renal disease on hemodialysis ([place])    Left leg weakness     TIA (transient ischemic attack) 10/27/2016   Dependence on renal dialysis ([place]) 08/09/2015   History of CVA with residual deficit 08/07/2015   Seizure, late effect of stroke ([place]) 08/07/2015   Diabetic peripheral neuropathy ([place]) 08/07/2015   Unspecified protein-calorie malnutrition ([place]) 05/27/2015   Seizure ([place]) 04/29/2015   Coagulation defect, unspecified ([place]) 07/16/2014   Type 2 diabetes mellitus with diabetic peripheral angiopathy without gangrene ([place]) 07/01/2014   Pruritus, unspecified 05/26/2014   Elevated prostate specific antigen (PSA) 08/03/2013   Encounter for immunization 07/10/2013   Obstructive sleep apnea 06/16/2013   Hyperlipidemia associated with type 2 diabetes mellitus ([place])    ESRD (end stage renal disease) ([place])    Erectile dysfunction associated with type 2 diabetes mellitus ([place])    Hypertension due to end stage renal disease caused by type 2 diabetes mellitus, on dialysis ([place])    COPD (chronic obstructive pulmonary disease) ([place])    Chronic combined systolic and diastolic CHF (congestive heart failure) (HCC)    Anemia of chronic disease 12/27/2011   Long term (current) use of insulin ([place]) 11/03/2009   Gout, unspecified 02/28/2009   Iron deficiency anemia, unspecified 10/30/2008   Secondary hyperparathyroidism of renal origin ([place]) 10/30/2008   Type 2 diabetes mellitus with hyperglycemia, without long-term current use of insulin ([place]) 06/08/1984    Orientation RESPIRATION BLADDER Height & Weight     Self, Situation, Place  O2 (Nasal Canula 2 L) Continent Weight: 207 lb 10.8 oz (94.2 kg) Height:  '5\' 9"'$  (175.3 cm)  BEHAVIORAL SYMPTOMS/MOOD NEUROLOGICAL BOWEL NUTRITION STATUS   (None)  (  None) Continent Diet (Renal/carb modified with fluid restriction: 1200 mL)  AMBULATORY STATUS COMMUNICATION OF NEEDS Skin   Extensive Assist Verbally Skin abrasions, Bruising, Other (Comment), Surgical wounds, PU Stage and Appropriate Care (Right leg amputation (ABD, Gauze,  Xeroform). Cracking.)   PU Stage 2 Dressing:  (Lower medial sacrum: Foam.)                   Personal Care Assistance Level of Assistance  Bathing, Dressing, Feeding Bathing Assistance: Maximum assistance Feeding assistance: Limited assistance Dressing Assistance: Maximum assistance     Functional Limitations Info  Sight, Hearing, Speech Sight Info: Adequate Hearing Info: Adequate Speech Info: Adequate    SPECIAL CARE FACTORS FREQUENCY  PT (By licensed PT), OT (By licensed OT)     PT Frequency: 5 x week OT Frequency: 5 x week            Contractures Contractures Info: Not present    Additional Factors Info  Code Status, Allergies Code Status Info: DNR Allergies Info: NKDA           Current Medications (07/17/2021):  This is the current hospital active medication list Current Facility-Administered Medications  Medication Dose Route Frequency Provider Last Rate Last Admin   acetaminophen (TYLENOL) tablet 1,000 mg  1,000 mg Oral TID Philis Pique, NP   1,000 mg at 07/17/21 1335   allopurinol (ZYLOPRIM) tablet 100 mg  100 mg Oral Daily Schnier, Dolores Lory, MD   100 mg at 07/17/21 1336   alum & mag hydroxide-simeth (MAALOX/MYLANTA) 200-200-20 MG/5ML suspension 15-30 mL  15-30 mL Oral Q2H PRN Elmore Guise, MD       amiodarone (PACERONE) tablet 200 mg  200 mg Oral Daily Schnier, Dolores Lory, MD   200 mg at 07/17/21 1336   aspirin EC tablet 81 mg  81 mg Oral Daily Schnier, Dolores Lory, MD   81 mg at 07/17/21 1336   atorvastatin (LIPITOR) tablet 40 mg  40 mg Oral Daily Schnier, Dolores Lory, MD   40 mg at 07/17/21 1335   Chlorhexidine Gluconate Cloth 2 % PADS 6 each  6 each Topical Q0600 Elmore Guise, MD   6 each at 07/17/21 1336   docusate sodium (COLACE) capsule 100 mg  100 mg Oral Daily Schnier, Dolores Lory, MD   100 mg at 07/17/21 1336   epoetin alfa (EPOGEN) injection 10,000 Units  10,000 Units Intravenous Q M,W,F-HD Kolluru, Sarath, MD   10,000 Units at 07/17/21  1133   feeding supplement (NEPRO CARB STEADY) liquid 237 mL  237 mL Oral Daily PRN Schnier, Dolores Lory, MD       guaiFENesin-dextromethorphan (ROBITUSSIN DM) 100-10 MG/5ML syrup 15 mL  15 mL Oral Q4H PRN Elmore Guise, MD   15 mL at 07/09/21 2352   heparin injection 5,000 Units  5,000 Units Subcutaneous Q8H Schnier, Dolores Lory, MD   5,000 Units at 07/17/21 0454   hydrALAZINE (APRESOLINE) injection 5 mg  5 mg Intravenous Q20 Min PRN Elmore Guise, MD       insulin aspart (novoLOG) injection 0-5 Units  0-5 Units Subcutaneous QHS Wieting, Richard, MD       insulin aspart (novoLOG) injection 0-6 Units  0-6 Units Subcutaneous TID WC Wieting, Richard, MD       labetalol (NORMODYNE) injection 10 mg  10 mg Intravenous Q10 min PRN Elmore Guise, MD       levETIRAcetam (KEPPRA) tablet 500 mg  500 mg Oral BID Schnier, Dolores Lory, MD   500  mg at 07/17/21 1337   midodrine (PROAMATINE) tablet 10 mg  10 mg Oral QID Loletha Grayer, MD   10 mg at 07/17/21 1336   mirtazapine (REMERON) tablet 15 mg  15 mg Oral QHS Clapacs, Christino T, MD   15 mg at 07/16/21 2134   ondansetron (ZOFRAN) tablet 4 mg  4 mg Oral Q6H PRN Schnier, Dolores Lory, MD       Or   ondansetron [facility]) injection 4 mg  4 mg Intravenous Q6H PRN Schnier, Dolores Lory, MD   4 mg at 07/08/21 1429   pantoprazole (PROTONIX) EC tablet 40 mg  40 mg Oral Daily Elmore Guise, MD   40 mg at 07/17/21 1337   phenol (CHLORASEPTIC) mouth spray 1 spray  1 spray Mouth/Throat PRN Elmore Guise, MD       polyethylene glycol (MIRALAX / GLYCOLAX) packet 17 g  17 g Oral BID PRN Mercy Riding, MD       senna-docusate (Senokot-S) tablet 1 tablet  1 tablet Oral QHS Schnier, Dolores Lory, MD   1 tablet at 07/16/21 2134   sertraline (ZOLOFT) tablet 25 mg  25 mg Oral QHS Schnier, Dolores Lory, MD   25 mg at 07/16/21 2134   sevelamer carbonate (RENVELA) tablet 800 mg  800 mg Oral TID WC Schnier, Dolores Lory, MD   800 mg at 07/17/21 1335   sodium chloride 0.9 % bolus 250 mL  250 mL  Intravenous Once Loletha Grayer, MD       traZODone (DESYREL) tablet 25 mg  25 mg Oral QHS PRN Schnier, Dolores Lory, MD   25 mg at 07/03/21 2048     Discharge Medications: Please see discharge summary for a list of discharge medications.  Relevant Imaging Results:  Relevant Lab Results:   Additional Information SS#: [id number]. HD MWF [employer].  Candie Chroman, LCSW

## 2021-07-17 NOTE — TOC Progression Note (Signed)
Transition of Care [facility]) - Progression Note    Patient Details  Name: Ernest [name] MRN: [id number] Date of Birth: 26-Oct-1945  Transition of Care [facility]) CM/SW [place], LCSW Phone Number: 07/17/2021, 1:33 PM  Clinical Narrative:   Left voicemail for daughter.  Expected Discharge Plan: [place] Barriers to Discharge: Continued Medical Work up  Expected Discharge Plan and Services Expected Discharge Plan: [place] In-house Referral: Clinical Social Work   Post Acute Care Choice: [place] Living arrangements for the past 2 months: [place]                                       Social Determinants of Health (SDOH) Interventions    Readmission Risk Interventions Readmission Risk Prevention Plan 06/28/2021  Transportation Screening Complete  Medication Review Press photographer) Complete  PCP or Specialist appointment within 3-5 days of discharge Complete  HRI or Home Care Consult Complete  SW Recovery Care/Counseling Consult Complete  Palliative Care Screening Not [place] Complete  Some recent data might be hidden

## 2021-07-17 NOTE — Progress Notes (Signed)
Patient ID: Ernest [name], male   DOB: April 29, 1946, 75 y.o.   MRN: [id number] [facility] PROGRESS NOTE  Ernest [name] [id number] DOB: 13-Jan-1946 DOA: 06/26/2021 PCP: Patient, [name] (Inactive)  HPI/Subjective: Answers a few yes or no questions.  Answers no to all questions.  Not in any pain.  Some early this morning prior to dialysis where he was eating some breakfast.  Initially admitted with gangrene and osteomyelitis of the right heel.  Objective: Vitals:   07/17/21 1245 07/17/21 1521  BP: (!) 105/54 (!) 138/104  Pulse: 69 70  Resp: 12 18  Temp: 98.1 F (36.7 C) 98.5 F (36.9 C)  SpO2: 100% 96%     Filed Weights   07/16/21 0316 07/17/21 0456 07/17/21 0834  Weight: 83.3 kg 94.2 kg 94.2 kg    ROS: Review of Systems  Respiratory:  Negative for shortness of breath.   Cardiovascular:  Negative for chest pain.  Gastrointestinal:  Negative for abdominal pain, nausea and vomiting.  Exam: Physical Exam HENT:     Head: Normocephalic.     Mouth/Throat:     Pharynx: No oropharyngeal exudate.  Eyes:     General: Lids are normal.     Conjunctiva/sclera: Conjunctivae normal.  Cardiovascular:     Rate and Rhythm: Normal rate and regular rhythm.     Heart sounds: Normal heart sounds, S1 normal and S2 normal.  Pulmonary:     Breath sounds: Normal breath sounds. No decreased breath sounds, wheezing, rhonchi or rales.  Abdominal:     Palpations: Abdomen is soft.     Tenderness: There is no abdominal tenderness.  Musculoskeletal:     Left lower leg: Swelling present.  Skin:    General: Skin is warm.     Findings: No rash.  Neurological:     Mental Status: He is alert.     Comments: Answer some yes/no questions.      Scheduled Meds:  acetaminophen  1,000 mg Oral TID   allopurinol  100 mg Oral Daily   amiodarone  200 mg Oral Daily   aspirin EC  81 mg Oral Daily   atorvastatin  40 mg Oral Daily   Chlorhexidine Gluconate Cloth  6 each Topical Q0600    docusate sodium  100 mg Oral Daily   epoetin (EPOGEN/PROCRIT) injection  10,000 Units Intravenous Q M,W,F-HD   heparin injection (subcutaneous)  5,000 Units Subcutaneous Q8H   insulin aspart  0-5 Units Subcutaneous QHS   insulin aspart  0-6 Units Subcutaneous TID WC   levETIRAcetam  500 mg Oral BID   midodrine  10 mg Oral QID   mirtazapine  15 mg Oral QHS   pantoprazole  40 mg Oral Daily   senna-docusate  1 tablet Oral QHS   sertraline  25 mg Oral QHS   sevelamer carbonate  800 mg Oral TID WC   Continuous Infusions:  sodium chloride      Assessment/Plan:  Hypotension.  Continue midodrine.  Tolerated dialysis today. End-stage renal disease.  Tolerated dialysis today.  Continue midodrine. Sepsis, present on admission with gangrene and osteomyelitis of the right heel.  Patient is a status post amputation on 07/08/2021 with right AKA.  Follow-up with vascular surgery as outpatient. Anemia of chronic disease.  Last hemoglobin 8.3. Chronic combined systolic and diastolic congestive heart failure.  Dialysis is the only way to manage fluid.  Limited with medication secondary to hypotension. Type 2 diabetes mellitus with hyperglycemia.  All sugars have been good even  though hemoglobin A1c elevated 8.4. Seizure disorder.  On Keppra. Arrhythmia.  On amiodarone. CAD and peripheral vascular disease on aspirin and statin Secondary hyperparathyroidism. Stage II sacral decubiti.  Documented first on 07/16/2021.  Partial-thickness loss of dermis presenting as shallow open ulcer with pink wound bed without slough.  Local wound care.    Code Status:     Code Status Orders  (From admission, onward)           Start     Ordered   07/13/21 1456  Do not attempt resuscitation (DNR)  Continuous       Question Answer Comment  In the event of cardiac or respiratory ARREST Do not call a "code blue"   In the event of cardiac or respiratory ARREST Do not perform Intubation, CPR, defibrillation or ACLS    In the event of cardiac or respiratory ARREST Use medication by any route, position, wound care, and other measures to relive pain and suffering. May use oxygen, suction and manual treatment of airway obstruction as needed for comfort.   Comments MOST form in [place]      07/13/21 1455           Code Status History     Date Active Date Inactive Code Status Order ID Comments User Context   07/08/2021 1625 07/13/2021 1455 Full Code [id number]  Elmore Guise, MD Inpatient   06/27/2021 0250 07/08/2021 1456 Full Code [id number]  Christel Mormon, MD ED   09/30/2019 1657 09/30/2019 2058 Full Code [id number]  Ina Homes, MD ED   09/08/2017 0359 10/07/2017 2150 Full Code [id number]  Collier Salina, MD ED   10/27/2016 2044 10/29/2016 2121 Full Code [id number]  Zada Finders, MD Inpatient   07/13/2015 1652 07/21/2015 1336 Full Code [id number]  Cathlyn Parsons, [name] Inpatient   07/11/2015 1629 07/13/2015 1652 Full Code [id number]  Juluis Mire, MD ED   04/29/2015 1352 04/30/2015 2038 Full Code [id number]  Lucious Groves, DO Inpatient   08/27/2013 0427 08/27/2013 1845 Full Code [id number]  Mal Misty, MD Inpatient      Family Communication: Spoke with daughter on the phone Disposition Plan: Status is: Inpatient  Dispo: The patient is from: Rehab              Anticipated d/c is to: Rehab              Patient currently medically stable to go out to rehab once we obtain a bed   Difficult to place patient.  Hopefully not.  Time spent: 25 minutes  [place]

## 2021-07-17 NOTE — Progress Notes (Signed)
Patient Tx initiated; No complications. Patient does not have  a ufr goal, will monitor the patients blood pressure closely.

## 2021-07-17 NOTE — Progress Notes (Signed)
[facility]  PROGRESS NOTE   Subjective:   Patient seen and evaluated during dialysis   HEMODIALYSIS FLOWSHEET:  Blood Flow Rate (mL/min): 400 mL/min Arterial Pressure (mmHg): -150 mmHg Venous Pressure (mmHg): 120 mmHg Transmembrane Pressure (mmHg): 70 mmHg Ultrafiltration Rate (mL/min): 130 mL/min Dialysate Flow Rate (mL/min): 500 ml/min Conductivity: Machine : 13.8 Conductivity: Machine : 13.8 Dialysis Fluid Bolus: Normal Saline Bolus Amount (mL): 250 mL  No complaints at this time Tolerating treatment well  Objective:  Vital signs in last 24 hours:  Temp:  [98 F (36.7 C)-98.9 F (37.2 C)] 98.1 F (36.7 C) (10/03 1245) Pulse Rate:  [60-82] 69 (10/03 1245) Resp:  [11-18] 12 (10/03 1245) BP: (102-153)/(49-108) 105/54 (10/03 1245) SpO2:  [97 %-100 %] 100 % (10/03 1245) Weight:  [94.2 kg] 94.2 kg (10/03 0834)  Weight change: 10.9 kg Filed Weights   07/16/21 0316 07/17/21 0456 07/17/21 0834  Weight: 83.3 kg 94.2 kg 94.2 kg    Intake/Output: I/O last 3 completed shifts: In: 240 [P.O.:240] Out: -    Intake/Output this shift:  No intake/output data recorded.  Physical Exam: General:  No acute distress  Head:  Normocephalic, atraumatic. Moist oral mucosal membranes  Eyes:  Anicteric  Lungs:   Clear to auscultation, normal effort, [place] O2  Heart:  S1S2 no rubs  Abdomen:   Soft, nontender, bowel sounds present  Extremities:  No peripheral edema.  Right AKA on 07/08/21  Neurologic:  Awake, alert, following commands  Skin:  No lesions  Access: Rt groin Permcath    Basic Metabolic Panel: Recent Labs  Lab 07/11/21 0503 07/12/21 0520 07/14/21 1303 07/15/21 1154  NA 135 139 135 133*  K 4.0 4.4 5.0 4.5  CL 98 99 97* 94*  CO2 '28 24 23 26  '$ GLUCOSE 154* 125* 129* 128*  BUN 32* 44* 58* 43*  CREATININE 4.79* 6.42* 9.34* 6.93*  CALCIUM 8.4* 8.7* 8.4* 8.5*  MG 1.7 1.8  --   --   PHOS 4.3 5.2*  --   --      CBC: Recent Labs  Lab 07/11/21 0503  07/12/21 0520 07/14/21 1303 07/15/21 1154  WBC 10.2 11.6* 9.8 10.1  HGB 7.9* 8.2* 8.7* 8.3*  HCT 25.0* 24.8* 27.1* 25.8*  MCV 86.5 86.4 85.2 86.0  PLT 216 258 346 325      Urinalysis: No results for input(s): COLORURINE, LABSPEC, PHURINE, GLUCOSEU, HGBUR, BILIRUBINUR, KETONESUR, PROTEINUR, UROBILINOGEN, NITRITE, LEUKOCYTESUR in the last 72 hours.  Invalid input(s): APPERANCEUR    Imaging: No results found.   Medications:    sodium chloride       acetaminophen  1,000 mg Oral TID   allopurinol  100 mg Oral Daily   amiodarone  200 mg Oral Daily   aspirin EC  81 mg Oral Daily   atorvastatin  40 mg Oral Daily   Chlorhexidine Gluconate Cloth  6 each Topical Q0600   docusate sodium  100 mg Oral Daily   epoetin (EPOGEN/PROCRIT) injection  10,000 Units Intravenous Q M,W,F-HD   heparin injection (subcutaneous)  5,000 Units Subcutaneous Q8H   insulin aspart  0-5 Units Subcutaneous QHS   insulin aspart  0-6 Units Subcutaneous TID WC   levETIRAcetam  500 mg Oral BID   midodrine  10 mg Oral QID   mirtazapine  15 mg Oral QHS   pantoprazole  40 mg Oral Daily   senna-docusate  1 tablet Oral QHS   sertraline  25 mg Oral QHS   sevelamer carbonate  800 mg Oral TID WC    Assessment/ Plan:      Ernest [name] is a 75 year old male with history of hypertension, coronary artery disease, COPD, congestive heart failure, hepatitis C, hyperlipidemia, ESRD on hemodialysis Monday Wednesday Friday.  He is now being admitted with history of acute respiratory failure and also sepsis.  Principal Problem:   ESRD (end stage renal disease) ([place]) Active Problems:   Type 2 diabetes mellitus with hyperglycemia, without long-term current use of insulin (HCC)   Seizure (HCC)   Acute on chronic combined systolic and diastolic CHF (congestive heart failure) (HCC)   Anemia of chronic disease   Sepsis (HCC)   PAD (peripheral artery disease) (HCC)   Wet gangrene (HCC)   S/P AKA (above knee  amputation), right (HCC)   Hypotension   Acute delirium   Pressure injury of skin  [facility] [address]/MWF/Rt groin Permcath   #1: ESRD: Received dialysis earlier today. Tolerated well. No UF Next treatment scheduled for Wednesday.   #2: Right lower extremity osteomyelitis: S/p right lower extremity angiogram.  S/p right AKA on 07/08/21.  PT/OT accessing rehab needs  #3: Anemia with chronic kidney diease: Hgb 8.3. EPO with dialysis treatments   #4: Secondary hyperparathyroidism: Calcium and phosphorus not at goal.  Sevelamer given with meals   #5: Hypotension: Soft BP, prescribed Midodrine.  Transferred briefly to ICU for pressors, but is now on midodrine '10mg'$  four times a day.     LOS: [facility] 10/3/20222:59 PM

## 2021-07-17 NOTE — Progress Notes (Signed)
Pre-dialysis information

## 2021-07-18 DIAGNOSIS — L89152 Pressure ulcer of sacral region, stage 2: Secondary | ICD-10-CM

## 2021-07-18 LAB — GLUCOSE, CAPILLARY
Glucose-Capillary: 96 mg/dL (ref 70–99)
Glucose-Capillary: 113 mg/dL — ABNORMAL HIGH (ref 70–99)
Glucose-Capillary: 124 mg/dL — ABNORMAL HIGH (ref 70–99)
Glucose-Capillary: 149 mg/dL — ABNORMAL HIGH (ref 70–99)

## 2021-07-18 LAB — RESP PANEL BY RT-PCR (FLU A&B, COVID) ARPGX2
Influenza A by PCR: NEGATIVE
Influenza B by PCR: NEGATIVE
SARS Coronavirus 2 by RT PCR: NEGATIVE

## 2021-07-18 LAB — CBC
HCT: 28.5 % — ABNORMAL LOW (ref 39.0–52.0)
Hemoglobin: 9 g/dL — ABNORMAL LOW (ref 13.0–17.0)
MCH: 27.4 pg (ref 26.0–34.0)
MCHC: 31.6 g/dL (ref 30.0–36.0)
MCV: 86.9 fL (ref 80.0–100.0)
Platelets: 332 10*3/uL (ref 150–400)
RBC: 3.28 MIL/uL — ABNORMAL LOW (ref 4.22–5.81)
RDW: 19.3 % — ABNORMAL HIGH (ref 11.5–15.5)
WBC: 7.6 10*3/uL (ref 4.0–10.5)
nRBC: 0 % (ref 0.0–0.2)

## 2021-07-18 MED ORDER — LEVETIRACETAM 500 MG PO TABS
500.0000 mg | ORAL_TABLET | ORAL | Status: DC
  Administered 2021-07-19: 500 mg via ORAL
  Filled 2021-07-18: qty 1

## 2021-07-18 NOTE — Progress Notes (Addendum)
Patient ID: Ernest [name], male   DOB: 06/04/1946, 75 y.o.   MRN: [id number] [facility] PROGRESS NOTE  Ernest [name] [id number] DOB: 06-28-1946 DOA: 06/26/2021 PCP: Patient, [name] (Inactive)  HPI/Subjective: Patient more talkative today and able to hold conversations rather than just yes or no answers.  A complete dialysis has helped his mental status.  Objective: Vitals:   07/18/21 0744 07/18/21 1602  BP: (!) 120/49 (!) 119/98  Pulse: (!) 57 81  Resp: 14 14  Temp: 97.6 F (36.4 C) 98.6 F (37 C)  SpO2: 100% 100%    Intake/Output Summary (Last 24 hours) at 07/18/2021 1627 Last data filed at 07/17/2021 2130 Gross per 24 hour  Intake 320 ml  Output --  Net 320 ml   Filed Weights   07/17/21 0456 07/17/21 0834 07/18/21 0500  Weight: 94.2 kg 94.2 kg 85.5 kg    ROS: Review of Systems  Respiratory:  Negative for shortness of breath.   Cardiovascular:  Negative for chest pain.  Gastrointestinal:  Negative for abdominal pain, nausea and vomiting.  Exam: Physical Exam HENT:     Head: Normocephalic.     Mouth/Throat:     Pharynx: No oropharyngeal exudate.  Eyes:     General: Lids are normal.     Conjunctiva/sclera: Conjunctivae normal.     Pupils: Pupils are equal, round, and reactive to light.  Cardiovascular:     Rate and Rhythm: Normal rate and regular rhythm.     Heart sounds: Normal heart sounds, S1 normal and S2 normal.  Pulmonary:     Breath sounds: Normal breath sounds. No decreased breath sounds, wheezing, rhonchi or rales.  Abdominal:     Palpations: Abdomen is soft.     Tenderness: There is no abdominal tenderness.  Musculoskeletal:     Left lower leg: Swelling present.  Skin:    General: Skin is warm.     Findings: No rash.  Neurological:     Mental Status: He is alert.     Comments: Answers all questions.  More talkative today.      Scheduled Meds:  acetaminophen  1,000 mg Oral TID   allopurinol  100 mg Oral Daily   amiodarone   200 mg Oral Daily   aspirin EC  81 mg Oral Daily   atorvastatin  40 mg Oral Daily   Chlorhexidine Gluconate Cloth  6 each Topical Q0600   docusate sodium  100 mg Oral Daily   epoetin (EPOGEN/PROCRIT) injection  10,000 Units Intravenous Q M,W,F-HD   heparin injection (subcutaneous)  5,000 Units Subcutaneous Q8H   insulin aspart  0-5 Units Subcutaneous QHS   insulin aspart  0-6 Units Subcutaneous TID WC   levETIRAcetam  500 mg Oral BID   midodrine  10 mg Oral QID   mirtazapine  15 mg Oral QHS   pantoprazole  40 mg Oral Daily   senna-docusate  1 tablet Oral QHS   sertraline  25 mg Oral QHS   sevelamer carbonate  800 mg Oral TID WC   Continuous Infusions:  sodium chloride     Brief history.  75 year old man with chronic combined systolic and diastolic congestive heart failure, COPD, anemia, depression, end-stage renal disease, hepatitis C, seizure history, hyperlipidemia, hypertension and stroke.  Patient was admitted 21 days ago with right heel gangrene and osteomyelitis.  Patient had a right AKA on 07/08/2021 by vascular surgery.  Postoperatively he did have hypotension and midodrine was started.  The patient was refusing dialysis  for a few days.  His daughter came in to speak with him and now he is agreeable to dialysis.  Yesterday was his first full dialysis session and mental status improved today.  Patient is much more talkative rather than just yes or no answers.  Can go out to rehab when insurance authorizes  Assessment/Plan:  Hypotension.  On midodrine to maintain blood pressure with dialysis. End-stage renal disease.  Tolerated dialysis yesterday.  Continue midodrine.  Mental status much improved with a full dialysis session. Sepsis, present on admission with gangrene and osteomyelitis of the right heel.  Patient is status post amputation with right AKA on 07/08/2021 by vascular surgery.  Follow-up with vascular surgery as an outpatient.  Usually staples remain in for 3 weeks. Chronic  combined systolic and diastolic congestive heart failure.  Dialysis is the only way to manage fluid.  Limited with medication secondary to hypotension. Type 2 diabetes mellitus with initial hyperglycemia.  All sugars have been good here despite hemoglobin A1c elevated 8.4. Seizure disorder on Keppra Arrhythmia history on amiodarone CAD and peripheral vascular disease on aspirin and statin Secondary hyperparathyroidism Stage II sacral decubiti first documented on 07/16/2021.  Partial thickness loss of dermis presenting as shallow open ulcer with pink wound bed without slough.  Local wound care. Check pulse ox on room air to see if we can get him off oxygen since had a complete dialysis session. Anemia of chronic disease    Code Status:     Code Status Orders  (From admission, onward)           Start     Ordered   07/13/21 1456  Do not attempt resuscitation (DNR)  Continuous       Question Answer Comment  In the event of cardiac or respiratory ARREST Do not call a "code blue"   In the event of cardiac or respiratory ARREST Do not perform Intubation, CPR, defibrillation or ACLS   In the event of cardiac or respiratory ARREST Use medication by any route, position, wound care, and other measures to relive pain and suffering. May use oxygen, suction and manual treatment of airway obstruction as needed for comfort.   Comments MOST form in [place]      07/13/21 1455           Code Status History     Date Active Date Inactive Code Status Order ID Comments User Context   07/08/2021 1625 07/13/2021 1455 Full Code [id number]  Elmore Guise, MD Inpatient   06/27/2021 0250 07/08/2021 1456 Full Code [id number]  Christel Mormon, MD ED   09/30/2019 1657 09/30/2019 2058 Full Code [id number]  Ina Homes, MD ED   09/08/2017 0359 10/07/2017 2150 Full Code [id number]  Collier Salina, MD ED   10/27/2016 2044 10/29/2016 2121 Full Code [id number]  Zada Finders, MD Inpatient   07/13/2015 1652 07/21/2015  1336 Full Code [id number]  Cathlyn Parsons, [name] Inpatient   07/11/2015 1629 07/13/2015 1652 Full Code [id number]  Juluis Mire, MD ED   04/29/2015 1352 04/30/2015 2038 Full Code [id number]  Lucious Groves, DO Inpatient   08/27/2013 0427 08/27/2013 1845 Full Code [id number]  Mal Misty, MD Inpatient      Family Communication: Left message for daughter on the phone  disposition Plan: Status is: Inpatient  Dispo: The patient is from: Rehab              Anticipated d/c is to: Rehab  Patient currently medically stable to go out to rehab once insurance authorizes   Difficult to place patient.  No  Consultants: Nephrology Vascular surgery  Procedures: Right AKA  Time spent: 25 minutes  [place]

## 2021-07-18 NOTE — TOC Progression Note (Addendum)
Transition of Care [facility]) - Progression Note    Patient Details  Name: Ernest [name] MRN: [id number] Date of Birth: May 23, 1946  Transition of Care [facility]) CM/SW [place], LCSW Phone Number: 07/18/2021, 12:36 PM  Clinical Narrative:   Spoke to patient's daughter. Told her that no other facility in [facility] is able to accept him. She is interested in moving him to either [place] or [place], [place] to be close to his brother or [place] or [place], [place] to be close to his grandson, son, and sisters. Told daughter family would have to transport him that far away. We would also have to get his Medicaid switched to whichever state he goes to and his HD center changed. She is agreeable to return to [facility] until this can all be arranged and he is strong enough to ride in the car. Left voicemail for admissions coordinator asking her to start insurance authorization.  1:22 pm: Received call back from [facility] admissions coordinator. She will start insurance authorization and find out turnaround time. Notified her of daughter's plans to eventually transfer him out of state.  Expected Discharge Plan: [place] Barriers to Discharge: Continued Medical Work up  Expected Discharge Plan and Services Expected Discharge Plan: [place] In-house Referral: Clinical Social Work   Post Acute Care Choice: [place] Living arrangements for the past 2 months: [place]                                       Social Determinants of Health (SDOH) Interventions    Readmission Risk Interventions Readmission Risk Prevention Plan 06/28/2021  Transportation Screening Complete  Medication Review Press photographer) Complete  PCP or Specialist appointment within 3-5 days of discharge Complete  HRI or Home Care Consult Complete  SW Recovery Care/Counseling Consult Complete  Palliative Care Screening Not [place] Complete  Some recent data might be hidden

## 2021-07-18 NOTE — Progress Notes (Signed)
  Chaplain On-Call was asked by RN Apolonio Schneiders to visit with the patient and his family. Apolonio Schneiders reported that the patient has experienced the death of his wife, poor health, and a left leg amputation within the past year.  Chaplain met patient and daughter and granddaughter. Provided much spiritual and emotional support as they discussed the impact of these losses on their family. Provided prayer and encouragement through their strong faith.  This Chaplain also told them about my own amputation three years ago. I offered encouragement that the patient hopefully can receive physical therapy at the [place], with the hope that a prosthesis will be possible for him.  Chaplain Pollyann Samples M.Div., [facility]

## 2021-07-18 NOTE — Progress Notes (Signed)
Attempted x3 to stand patient up and use bedside commode. Unable to stand up with max 2 assist.

## 2021-07-18 NOTE — Progress Notes (Addendum)
[facility]  PROGRESS NOTE   Subjective:   Patient seen resting in bed Alert and oriented Awaiting breakfast Denies pain and discomfort   Objective:  Vital signs in last 24 hours:  Temp:  [97.6 F (36.4 C)-98.6 F (37 C)] 97.6 F (36.4 C) (10/04 0744) Pulse Rate:  [57-70] 57 (10/04 0744) Resp:  [13-18] 14 (10/04 0744) BP: (95-138)/(45-104) 120/49 (10/04 0744) SpO2:  [96 %-100 %] 100 % (10/04 0744) Weight:  [85.5 kg] 85.5 kg (10/04 0500)  Weight change: 0 kg Filed Weights   07/17/21 0456 07/17/21 0834 07/18/21 0500  Weight: 94.2 kg 94.2 kg 85.5 kg    Intake/Output: I/O last 3 completed shifts: In: 320 [P.O.:320] Out: 0    Intake/Output this shift:  No intake/output data recorded.  Physical Exam: General:  No acute distress  Head:  Normocephalic, atraumatic. Moist oral mucosal membranes  Eyes:  Anicteric  Lungs:   Clear to auscultation, normal effort, [place] O2  Heart:  S1S2 no rubs  Abdomen:   Soft, nontender, bowel sounds present  Extremities:  No peripheral edema.  Right AKA on 07/08/21  Neurologic:  Awake, alert, following commands  Skin:  No lesions  Access: Rt groin Permcath    Basic Metabolic Panel: Recent Labs  Lab 07/12/21 0520 07/14/21 1303 07/15/21 1154  NA 139 135 133*  K 4.4 5.0 4.5  CL 99 97* 94*  CO2 '24 23 26  '$ GLUCOSE 125* 129* 128*  BUN 44* 58* 43*  CREATININE 6.42* 9.34* 6.93*  CALCIUM 8.7* 8.4* 8.5*  MG 1.8  --   --   PHOS 5.2*  --   --      CBC: Recent Labs  Lab 07/12/21 0520 07/14/21 1303 07/15/21 1154 07/18/21 0437  WBC 11.6* 9.8 10.1 7.6  HGB 8.2* 8.7* 8.3* 9.0*  HCT 24.8* 27.1* 25.8* 28.5*  MCV 86.4 85.2 86.0 86.9  PLT 258 346 325 332      Urinalysis: No results for input(s): COLORURINE, LABSPEC, PHURINE, GLUCOSEU, HGBUR, BILIRUBINUR, KETONESUR, PROTEINUR, UROBILINOGEN, NITRITE, LEUKOCYTESUR in the last 72 hours.  Invalid input(s): APPERANCEUR    Imaging: No results found.   Medications:     sodium chloride       acetaminophen  1,000 mg Oral TID   allopurinol  100 mg Oral Daily   amiodarone  200 mg Oral Daily   aspirin EC  81 mg Oral Daily   atorvastatin  40 mg Oral Daily   Chlorhexidine Gluconate Cloth  6 each Topical Q0600   docusate sodium  100 mg Oral Daily   epoetin (EPOGEN/PROCRIT) injection  10,000 Units Intravenous Q M,W,F-HD   heparin injection (subcutaneous)  5,000 Units Subcutaneous Q8H   insulin aspart  0-5 Units Subcutaneous QHS   insulin aspart  0-6 Units Subcutaneous TID WC   levETIRAcetam  500 mg Oral BID   midodrine  10 mg Oral QID   mirtazapine  15 mg Oral QHS   pantoprazole  40 mg Oral Daily   senna-docusate  1 tablet Oral QHS   sertraline  25 mg Oral QHS   sevelamer carbonate  800 mg Oral TID WC    Assessment/ Plan:      Ernest [name] is a 75 year old male with history of hypertension, coronary artery disease, COPD, congestive heart failure, hepatitis C, hyperlipidemia, ESRD on hemodialysis Monday Wednesday Friday.  He is now being admitted with history of acute respiratory failure and also sepsis.  Principal Problem:   ESRD (end stage renal disease) (  [place]) Active Problems:   Type 2 diabetes mellitus with hyperglycemia, without long-term current use of insulin (HCC)   Seizure (HCC)   Acute on chronic combined systolic and diastolic CHF (congestive heart failure) (HCC)   Anemia of chronic disease   Sepsis (HCC)   PAD (peripheral artery disease) (HCC)   Wet gangrene (HCC)   S/P AKA (above knee amputation), right (HCC)   Hypotension  [facility] [address]/MWF/Rt groin Permcath   #1: ESRD: Received dialysis yesterday, tolerated well. No UF. Plan for dialysis tomorrow. Dialysis coordinator aware of patient and monitoring for discharge needs.   #2: Right lower extremity osteomyelitis: S/p right lower extremity angiogram.  S/p right AKA on 07/08/21.  Pain well controlled  #3: Anemia with chronic kidney diease: Hgb 9.0. Continue EPO with  dialysis treatments   #4: Secondary hyperparathyroidism: Calcium and phosphorus not at goal.  Sevelamer given with meals   #5: Hypotension: Soft BP, prescribed Midodrine.  Transferred briefly to ICU for pressors, but is now on midodrine '10mg'$  four times a day. AM BP 120/49    LOS: [facility] 10/4/20221:30 PM

## 2021-07-18 NOTE — Evaluation (Signed)
Physical Therapy Re-Evaluation Patient Details Name: Ernest [name] MRN: [id number] DOB: [birth year] Today's Date: 07/18/2021  History of Present Illness  Patient is a 75 year old male with prolonged hospital stay, initially admitted for gangrene and osteomyelitis of the right heel. Patient is status post amputation with right AKA on 07/08/2021. Patient required ICU due to hypotension for pressors but has since transferred to the floor. ESRD and getting hemodialysis. Plan is now for patient to discharge to SNF.  Clinical Impression  Re-evaluation performed. Patient sleeping on arrival to room, wakes to voice but is lethargic at times and has eyes closed for part of session. Patient asks when he can go home. Patient required maximal assistance and maximal cues for rolling in bed to right side for repositioning and skin integrity. Patient participates in LLE therapeutic exercises for strengthening with AAROM and cues for technique and participation. Further mobility efforts deferred due to lethargy. Educated  patient importance of routine repositioning for skin integrity using bed rails for support and AROM of LLE for strengthening. Continue to recommend SNF placement at discharge. PT will continue to follow to maximize independence and decrease caregiver burden.     Recommendations for follow up therapy are one component of a multi-disciplinary discharge planning process, led by the attending physician.  Recommendations may be updated based on patient status, additional functional criteria and insurance authorization.  Follow Up Recommendations SNF    Equipment Recommendations  Other (comment) (to be determined at next level of care)    Recommendations for Other Services       Precautions / Restrictions Precautions Precautions: Fall Precaution Comments: right femoral access Restrictions Weight Bearing Restrictions: Yes RLE Weight Bearing: Non weight bearing      Mobility  Bed  Mobility Overal bed mobility: Needs Assistance Bed Mobility: Rolling Rolling: Max assist         General bed mobility comments: Max A for rolling in bed to right side with maximal verbal and tactile cues for technique, reaching with LUE. increased time and effort required for bed mobility. patient asking to be covered back up with blankets, eyes closed, and asking when he is going home. further mobility deferred based on participation. patient was educated on routine repositioning to prevent against skin breakdown    Transfers                    Ambulation/Gait                Stairs            Wheelchair Mobility    Modified Rankin (Stroke Patients Only)       Balance                                             Pertinent Vitals/Pain Pain Assessment: No/denies pain    Home Living Family/patient expects to be discharged to:: Skilled nursing facility                      Prior Function Level of Independence: Needs assistance   Gait / Transfers Assistance Needed: Pt reports he scoots onto electric scooter at SNF. He says he has not stood or taken steps in several weeks. Per chart review, 7 months ago, he was walking short distances with RW.  ADL's / Homemaking Assistance Needed: Required assist for bathing, dressing,  and IADLs.        Hand Dominance   Dominant Hand: Right    Extremity/Trunk Assessment   Upper Extremity Assessment Upper Extremity Assessment: Generalized weakness    Lower Extremity Assessment Lower Extremity Assessment: Generalized weakness RLE Deficits / Details: patient able to activate hip movement. difficult to assess as patient lethargic and with poor participation overall LLE Deficits / Details: patient able to perform SLR with AAROM and activate hip/foot movement. generalized deconditioning and weakness throughout       Communication   Communication: HOH  Cognition Arousal/Alertness:  Lethargic Behavior During Therapy: Flat affect Overall Cognitive Status: No family/caregiver present to determine baseline cognitive functioning                                        General Comments      Exercises General Exercises - Lower Extremity Heel Slides: AAROM;Strengthening;Left;10 reps;Supine Hip ABduction/ADduction: AAROM;Strengthening;Left;10 reps;Supine Straight Leg Raises: AAROM;Strengthening;Both;10 reps;Supine Other Exercises Other Exercises: exercises performed on LLE with verbal cues for technique for strengthening. encouraged AROM of LLE throughout the day as able   Assessment/Plan    PT Assessment Patient needs continued PT services  PT Problem List Decreased strength;Decreased activity tolerance;Decreased knowledge of use of DME;Decreased balance;Decreased safety awareness;Decreased mobility;Decreased knowledge of precautions;Decreased skin integrity;Cardiopulmonary status limiting activity;Decreased cognition       PT Treatment Interventions DME instruction;Functional mobility training;Therapeutic activities;Therapeutic exercise;Balance training;Neuromuscular re-education;Cognitive remediation;Patient/family education;Wheelchair mobility training    PT Goals (Current goals can be found in the Care Plan section)  Acute Rehab PT Goals Patient Stated Goal: to go home PT Goal Formulation: With patient Time For Goal Achievement: 08/01/21 Potential to Achieve Goals: Fair    Frequency Min 2X/week   Barriers to discharge        Co-evaluation               AM-PAC PT "6 Clicks" Mobility  Outcome Measure Help needed turning from your back to your side while in a flat bed without using bedrails?: A Lot Help needed moving from lying on your back to sitting on the side of a flat bed without using bedrails?: Total Help needed moving to and from a bed to a chair (including a wheelchair)?: Total Help needed standing up from a chair using your  arms (e.g., wheelchair or bedside chair)?: Total Help needed to walk in hospital room?: Total Help needed climbing 3-5 steps with a railing? : Total 6 Click Score: 7    End of Session Equipment Utilized During Treatment: Oxygen Activity Tolerance: Patient limited by lethargy;Patient limited by fatigue Patient left: in bed;with call bell/phone within reach;with bed alarm set;with SCD's reapplied (SCD reapplied to LLE) Nurse Communication: Mobility status PT Visit Diagnosis: Muscle weakness (generalized) (M62.81);Difficulty in walking, not elsewhere classified (R26.2);Other abnormalities of gait and mobility (R26.89);Unsteadiness on feet (R26.81)    Time: [id number] PT Time Calculation (min) (ACUTE ONLY): 16 min   Charges:   PT Evaluation $PT Re-evaluation: 1 Re-eval PT Treatments $Therapeutic Exercise: 8-22 mins        Minna Merritts, PT, MPT   Percell Locus 07/18/2021, 9:51 AM

## 2021-07-18 NOTE — Evaluation (Signed)
Occupational Therapy Re-evaluation Patient Details Name: Ernest [name] MRN: [id number] DOB: Nov 26, 1945 Today's Date: 07/18/2021   History of Present Illness Patient is a 75 year old male with prolonged hospital stay, initially admitted for gangrene and osteomyelitis of the right heel. Patient is status post amputation with right AKA on 07/08/2021. Patient required ICU due to hypotension for pressors but has since transferred to the floor. ESRD and getting hemodialysis. Plan is now for patient to discharge to SNF.   Clinical Impression   Pt seen for OT re-evaluation this date d/t transition to higher level of care and resuming HD. Pt now on 2C at time of OT re-eval. Pt presents this date with increased attn to task and is more conversational throughout session. He is better able to follow commands. OT provides MAX A to optimize pt positioning/propel towards HOB. Once positioning optimized, pt placed in chair position, OT uses neck roll for cervical support and pillows under elbows for UE support as well as tray SETUP. Pt is able to self feed with 80% success once optimized. Pt left in chair position with alarm set and CNA notified at end of session. Continue to anticipate that STR is most prudent d/c recommendation. Will continue to follow acutely.      Recommendations for follow up therapy are one component of a multi-disciplinary discharge planning process, led by the attending physician.  Recommendations may be updated based on patient status, additional functional criteria and insurance authorization.   Follow Up Recommendations  SNF    Equipment Recommendations  Other (comment) (defer to next level of care)    Recommendations for Other Services       Precautions / Restrictions Precautions Precautions: Fall Precaution Comments: right femoral access Restrictions Weight Bearing Restrictions: Yes RLE Weight Bearing: Non weight bearing Other Position/Activity Restrictions: NWB per  secure chart with podiatry      Mobility Bed Mobility Overal bed mobility: Needs Assistance Bed Mobility: Rolling Rolling: Max assist         General bed mobility comments: MAX A in trendelenberg for propulsion towards HOB, with OT cueing pt to use UEs with railing and flex to push through L LE to assist in repositioning/optimizing positioning in prep for meal time    Transfers                      Balance       Sitting balance - Comments: OT assuits pt into coming to long sitting while in bed to allow phyisican to auscultate lung sounds from posterior aspect with MOD/MAX A using upper extremity strength.                                   ADL either performed or assessed with clinical judgement   ADL Overall ADL's : Needs assistance/impaired Eating/Feeding: Set up;Minimal assistance;Bed level;Sitting Eating/Feeding Details (indicate cue type and reason): chair position in bed, cervical support placed, elbows supported on pillows.                                         Vision Patient Visual Report: No change from baseline       Perception     Praxis      Pertinent Vitals/Pain Pain Assessment: No/denies pain     Hand Dominance Right  Extremity/Trunk Assessment Upper Extremity Assessment Upper Extremity Assessment: Generalized weakness RUE Deficits / Details: limited shld ROM, when elbow propped on pillow, better able to use R UE for ADLs. Grasp MMT grossly 3+/5, limited strength but able to use for fxl ADLs. RUE Sensation: WNL LUE Deficits / Details: limited shld ROM 2/2 weakness, elbow ROM WFL. Grasp MMT grossly 3+/5   Lower Extremity Assessment Lower Extremity Assessment: Generalized weakness;Defer to PT evaluation RLE Deficits / Details: patient able to activate hip movement. difficult to assess as patient lethargic and with poor participation overall LLE Deficits / Details: patient able to perform SLR with AAROM and  activate hip/foot movement. generalized deconditioning and weakness throughout       Communication Communication Communication: HOH   Cognition Arousal/Alertness: Lethargic Behavior During Therapy: Flat affect Overall Cognitive Status: No family/caregiver present to determine baseline cognitive functioning                                 General Comments: Pt much more appropriate and talkative today. He answers some questions appropriately such as "Right handed" in prep for breakfast. He is more conversational. Still not completely oriented to situation and lacks some insight (states he is going to go to his brother's house). But overall, better able to participate and follow commands.   General Comments       Exercises Exercises: Amputee General Exercises - Lower Extremity Heel Slides: AAROM;Strengthening;Left;10 reps;Supine Hip ABduction/ADduction: AAROM;Strengthening;Left;10 reps;Supine Straight Leg Raises: AAROM;Strengthening;Both;10 reps;Supine Other Exercises Other Exercises: OT SETUP pt for meal time and pt demos 80% success with only ~20% spillage once positioninig optimized.   Shoulder Instructions      Home Living Family/patient expects to be discharged to:: Skilled nursing facility ([facility] [employer])                                        Prior Functioning/Environment Level of Independence: Needs assistance  Gait / Transfers Assistance Needed: Pt reports he scoots onto electric scooter at SNF. He says he has not stood or taken steps in several weeks. Per chart review, 7 months ago, he was walking short distances with RW. ADL's / Homemaking Assistance Needed: Required assist for bathing, dressing, and IADLs.   Comments: all information obtained from chart        OT Problem List: Decreased strength;Decreased range of motion;Decreased activity tolerance;Impaired balance (sitting and/or standing);Decreased safety awareness;Decreased  knowledge of use of DME or AE;Decreased cognition;Decreased knowledge of precautions;Pain      OT Treatment/Interventions: Self-care/ADL training;Therapeutic exercise;Therapeutic activities;Cognitive remediation/compensation;Energy conservation;Patient/family education;DME and/or AE instruction;Balance training    OT Goals(Current goals can be found in the care plan section) Acute Rehab OT Goals Patient Stated Goal: to go to brother's house OT Goal Formulation: With patient Time For Goal Achievement: 08/01/21 Potential to Achieve Goals: Fair ADL Goals Pt Will Perform Eating: with modified independence;with set-up;with adaptive utensils Pt Will Perform Grooming: with set-up;with modified independence;sitting (bed level, chair position, built up handles for toothbrush and other implements as needed.)  OT Frequency: Min 1X/week   Barriers to D/C:            Co-evaluation              AM-PAC OT "6 Clicks" Daily Activity     Outcome Measure Help from another person eating meals?: A Lot  Help from another person taking care of personal grooming?: A Lot Help from another person toileting, which includes using toliet, bedpan, or urinal?: Total Help from another person bathing (including washing, rinsing, drying)?: Total Help from another person to put on and taking off regular upper body clothing?: A Lot Help from another person to put on and taking off regular lower body clothing?: A Lot 6 Click Score: 10   End of Session Equipment Utilized During Treatment: Oxygen Nurse Communication: Mobility status  Activity Tolerance: Patient tolerated treatment well Patient left: in bed;with call bell/phone within reach;with bed alarm set  OT Visit Diagnosis: Muscle weakness (generalized) (M62.81);Other abnormalities of gait and mobility (R26.89)                Time: [id number] OT Time Calculation (min): 24 min Charges:  OT General Charges $OT Visit: 1 Visit OT Evaluation $OT Re-eval: 1  Re-eval OT Treatments $Self Care/Home Management : 8-22 mins  Gerrianne Scale, MS, OTR/L ascom [phone] 07/18/21, 10:51 AM

## 2021-07-19 DIAGNOSIS — L8990 Pressure ulcer of unspecified site, unspecified stage: Secondary | ICD-10-CM | POA: Diagnosis present

## 2021-07-19 LAB — GLUCOSE, CAPILLARY
Glucose-Capillary: 96 mg/dL (ref 70–99)
Glucose-Capillary: 80 mg/dL (ref 70–99)
Glucose-Capillary: 90 mg/dL (ref 70–99)
Glucose-Capillary: 152 mg/dL — ABNORMAL HIGH (ref 70–99)

## 2021-07-19 NOTE — Progress Notes (Signed)
PT Cancellation Note  Patient Details Name: Ernest [name] MRN: [id number] DOB: [birth year]   Cancelled Treatment:     Pt off floor at test/procedure. Will continue PT per POC next availability.   [name] 07/19/2021, 1:20 PM

## 2021-07-19 NOTE — Progress Notes (Signed)
Noted slight decrease in Patient blood pressure, patient is alert and resting comfortable will notify RN Rolan Lipa of the changes and will continue to monitor the patient closely.

## 2021-07-19 NOTE — TOC Progression Note (Signed)
Transition of Care [facility]) - Progression Note    Patient Details  Name: MELL THANE MRN: [id number] Date of Birth: 10-04-1946  Transition of Care [facility]) CM/SW Contact  Beverly Sessions, RN Phone Number: 07/19/2021, 2:37 PM  Clinical Narrative:      Message sent to Hilda Blades at [facility] for update on auth.  Awaiting response Expected Discharge Plan: [place] Barriers to Discharge: Continued Medical Work up  Expected Discharge Plan and Services Expected Discharge Plan: [place] In-house Referral: Clinical Social Work   Post Acute Care Choice: [place] Living arrangements for the past 2 months: [place]                                       Social Determinants of Health (SDOH) Interventions    Readmission Risk Interventions Readmission Risk Prevention Plan 06/28/2021  Transportation Screening Complete  Medication Review [employer]) Complete  PCP or Specialist appointment within 3-5 days of discharge Complete  HRI or [place] Complete  SW Recovery Care/Counseling Consult Complete  Palliative Care Screening Not [place] Complete  Some recent data might be hidden

## 2021-07-19 NOTE — Progress Notes (Signed)
PROGRESS NOTE  Ernest [name] [id number] DOB: January 20, 1946 DOA: 06/26/2021 PCP: Patient, [name] (Inactive)  Brief History   HPI was taken from [name] is a 75 y.o. African-American male with medical history significant for end-stage renal disease on hemodialysis Monday Wednesday and Friday, chronic systolic CHF, COPD, coronary artery disease, DVT, GERD, gout, hepatitis C, hypertension, dyslipidemia and CVA, who presented to emergency room with acute onset of fever with associated chills that started today at the skilled nursing facility.  The patient has a right heel decubitus ulcer for which has been getting oral antibiotics however the wound has been getting progressively worse.  His right heel pain has been sharp and throbbing.  He was noted to have leukocytosis at his nursing home.  His roommate tested positive for COVID-19 however the patient tested negative there.  He denies any cough or wheezing or dyspnea or chest pain or palpitations.  He was noted to be hypoxic however.  No nausea or vomiting or abdominal pain.  ED Course: Upon presentation to the ER, temperature 100.1 and pulse ox 90 was 97% on 4 L of O2 by nasal cannula, vital signs otherwise within normal.  Later on respiratory rate was up to 26.  Labs revealed mild hyperkalemia 5.3 and hyperglycemia 330 with a BUN of 62 and creatinine of 7.02 and albumin 2.4.  Lactic acid 3.1 and later 2.9 with procalcitonin of 13.2.  CBC showed leukocytosis 19.6 with neutrophilia and anemia.  INR was 1.4 with PT of 16.7 and PTT 34.  Influenza antigens and COVID-19 PCR came back negative.  Blood cultures were drawn.   EKG as reviewed by me : EKG showed normal sinus rhythm with a rate of 80 with prolonged PR interval left anterior fascicular block and abnormal R wave progression with T wave inversion laterally. Imaging: Portable chest x-ray showed chronic airspace disease throughout the mid and lower left lung with a stable chronic  loculated left pleural effusion with no new acute findings. Right foot x-ray revealed soft tissue ulceration along the right heel with adjacent area of cortical destruction noted in the right calcaneus suspicious for acute osteomyelitis.  The patient was given 2050 mL IV lactated ringer bolus, IV Zosyn and vancomycin.  He will be admitted to a med monitored bed for further evaluation and management   Ernest [name] has end-stage renal disease on hemodialysis Monday Wednesday Friday, multiple comorbidities as noted below, who presented on 26 June 2021 from his skilled nursing facility with the onset of fever with associated chills.  The patient was noted to have a right heel decubitus ulcer for which she had been getting oral antibiotics.  The wound has been getting progressively worse.  The patient developed wet gangrene and required above-the-knee amputation on 07/11/2021.  Postoperatively the patient has been noted to be hypotensive and somewhat lethargic and occasionally When arousable he does state that he feels he has some discomfort in his chest but cannot describe it as pain per se he does have any other complaint except postoperative pain.  We are asked to follow the patient due to his hypotension.  The patient was sent to the step down unit.  On 06/26/2021 the ap tient underwent echocardiogram that demonstrated EF of 50-55% with moderate LVH. Regional wall motion could not be evaluated. RV function was normal with normal RV size. PA pressure could not be evaluated.   He has been placed on midodrine 10 mg QID. Even so he continues to  be hypotensive. He went to HD today, but no UF. The patient actually received a 250 cc bolus. Blood pressures remained low after he returned to his room.    He has been evaluated by PT/OT. They have recommended SNF placement. Patient is not medically stable for discharge due to hypotension.  9/24 right above-the-knee amputation due to gangrene   Consultants   Nephrology Vascular surgery  Procedures  AKA Rt LE.  Antibiotics   Anti-infectives (From admission, onward)    Start     Dose/Rate Route Frequency Ordered Stop   07/09/21 0600  ceFAZolin (ANCEF) IVPB 1 g/50 mL premix  Status:  Discontinued        1 g 100 mL/hr over 30 Minutes Intravenous On call to O.R. 07/08/21 1510 07/08/21 1612   07/01/21 1000  ceFEPIme (MAXIPIME) 1 g in sodium chloride 0.9 % 100 mL IVPB  Status:  Discontinued        1 g 200 mL/hr over 30 Minutes Intravenous Every 24 hours 06/30/21 1254 07/09/21 1229   06/30/21 1600  vancomycin (VANCOCIN) IVPB 1000 mg/200 mL premix  Status:  Discontinued        1,000 mg 200 mL/hr over 60 Minutes Intravenous Every M-W-F (Hemodialysis) 06/30/21 1254 07/09/21 1229   06/29/21 0800  ceFAZolin (ANCEF) IVPB 1 g/50 mL premix  Status:  Discontinued       Note to Pharmacy: To be given in specials   1 g 100 mL/hr over 30 Minutes Intravenous  Once 06/28/21 1441 06/28/21 1651   06/28/21 1513  ceFAZolin (ANCEF) 1-4 GM/50ML-% IVPB       Note to Pharmacy: Corlis Hove   : cabinet override      06/28/21 1513 06/29/21 0314   06/28/21 1200  vancomycin (VANCOCIN) IVPB 1000 mg/200 mL premix  Status:  Discontinued        1,000 mg 200 mL/hr over 60 Minutes Intravenous Every M-W-F (Hemodialysis) 06/27/21 0959 06/30/21 1254   06/27/21 1100  metroNIDAZOLE (FLAGYL) IVPB 500 mg        500 mg 100 mL/hr over 60 Minutes Intravenous Every 12 hours 06/27/21 0250 07/04/21 1059   06/27/21 1100  vancomycin (VANCOREADY) IVPB 750 mg/150 mL        750 mg 150 mL/hr over 60 Minutes Intravenous  Once 06/27/21 1000 06/27/21 1137   06/27/21 1000  ceFEPIme (MAXIPIME) 1 g in sodium chloride 0.9 % 100 mL IVPB  Status:  Discontinued        1 g 200 mL/hr over 30 Minutes Intravenous  Once 06/27/21 0250 06/27/21 0316   06/27/21 1000  ceFEPIme (MAXIPIME) 1 g in sodium chloride 0.9 % 100 mL IVPB  Status:  Discontinued        1 g 200 mL/hr over 30 Minutes Intravenous  Every 24 hours 06/27/21 0316 06/30/21 1254   06/27/21 0336  vancomycin (VANCOCIN) IVPB 1000 mg/200 mL premix  Status:  Discontinued        1,000 mg 200 mL/hr over 60 Minutes Intravenous Every Dialysis 06/27/21 0337 06/27/21 1258   06/27/21 0330  vancomycin (VANCOREADY) IVPB 750 mg/150 mL  Status:  Discontinued        750 mg 150 mL/hr over 60 Minutes Intravenous  Once 06/27/21 0258 06/27/21 0959   06/27/21 0300  vancomycin (VANCOCIN) IVPB 1000 mg/200 mL premix  Status:  Discontinued        1,000 mg 200 mL/hr over 60 Minutes Intravenous  Once 06/27/21 0250 06/27/21 0258   06/27/21 0115  piperacillin-tazobactam (  ZOSYN) IVPB 3.375 g        3.375 g 100 mL/hr over 30 Minutes Intravenous  Once 06/27/21 0112 06/27/21 0255   06/26/21 2330  cefTRIAXone (ROCEPHIN) 1 g in sodium chloride 0.9 % 100 mL IVPB  Status:  Discontinued        1 g 200 mL/hr over 30 Minutes Intravenous  Once 06/26/21 2321 06/27/21 0111   06/26/21 2330  vancomycin (VANCOCIN) IVPB 1000 mg/200 mL premix        1,000 mg 200 mL/hr over 60 Minutes Intravenous  Once 06/26/21 2321 06/27/21 0210      Subjective  The patient appears very fatigued, but awake. No new complaints.  Objective   Vitals:  Vitals:   07/19/21 1621 07/19/21 1624  BP: (!) 91/29 106/60  Pulse: 81 82  Resp: 18 18  Temp: 98.6 F (37 C) 98.8 F (37.1 C)  SpO2: 97% 100%    Exam:  Constitutional:  Appears calm and comfortable Eyes:  pupils and irises appear normal Normal lids and conjunctivae ENMT:  grossly normal hearing  Lips appear normal external ears, nose appear normal Oropharynx: mucosa, tongue,posterior pharynx appear normal Neck:  neck appears normal, no masses, normal ROM, supple no thyromegaly Respiratory:  CTA bilaterally, no w/r/r.  Respiratory effort normal. No retractions or accessory muscle use Cardiovascular:  RRR, no m/r/g No LE extremity edema   Normal pedal pulses Abdomen:  Abdomen appears normal; no tenderness or  masses No hernias No HSM Musculoskeletal:  Digits/nails BUE: no clubbing, cyanosis, petechiae, infection exam of joints, bones, muscles of at least one of following: head/neck, RUE, LUE, RLE, LLE   strength and tone normal, no atrophy, no abnormal movements No tenderness, masses Normal ROM, no contractures  gait and station Skin:  No rashes, lesions, ulcers palpation of skin: no induration or nodules Neurologic:  CN 2-12 intact Sensation all 4 extremities intact Psychiatric:  Mental status Mood, affect appropriate Orientation to person, place, time  judgment and insight appear intact     I have personally reviewed the following:   Today's Data  Vitals  Lab Data  None today  Imaging  CXR X-ray foot.  Cardiology Data  EKG  Scheduled Meds:  acetaminophen  1,000 mg Oral TID   allopurinol  100 mg Oral Daily   amiodarone  200 mg Oral Daily   aspirin EC  81 mg Oral Daily   atorvastatin  40 mg Oral Daily   Chlorhexidine Gluconate Cloth  6 each Topical Q0600   docusate sodium  100 mg Oral Daily   epoetin (EPOGEN/PROCRIT) injection  10,000 Units Intravenous Q M,W,F-HD   heparin injection (subcutaneous)  5,000 Units Subcutaneous Q8H   insulin aspart  0-5 Units Subcutaneous QHS   insulin aspart  0-6 Units Subcutaneous TID WC   levETIRAcetam  500 mg Oral BID   levETIRAcetam  500 mg Oral Once per day on Mon Wed Fri   midodrine  10 mg Oral QID   mirtazapine  15 mg Oral QHS   pantoprazole  40 mg Oral Daily   senna-docusate  1 tablet Oral QHS   sertraline  25 mg Oral QHS   sevelamer carbonate  800 mg Oral TID WC   Continuous Infusions:  sodium chloride      Principal Problem:   ESRD (end stage renal disease) ([place]) Active Problems:   Type 2 diabetes mellitus with hyperglycemia, without long-term current use of insulin (HCC)   Seizure (HCC)   Acute on chronic combined systolic  and diastolic CHF (congestive heart failure) (HCC)   Anemia of chronic disease   Sepsis  ([place])   PAD (peripheral artery disease) ([place])   Wet gangrene (HCC)   S/P AKA (above knee amputation), right (HCC)   Hypotension   Pressure ulcer   Pressure injury of skin   LOS: 22 days  A & P  Hypotension.  On midodrine to maintain blood pressure with dialysis. However for HD today no UF taken. In fact the patient received a 250 cc bolus. SBP in the 90's upon return to room. End-stage renal disease.  Tolerated dialysis yesterday.  Continue midodrine.  Mental status much improved with a full dialysis session. Sepsis, present on admission with gangrene and osteomyelitis of the right heel.  Patient is status post amputation with right AKA on 07/08/2021 by vascular surgery.  Follow-up with vascular surgery as an outpatient.  Usually staples remain in for 3 weeks. Chronic combined systolic and diastolic congestive heart failure.  Dialysis is the only way to manage fluid.  Limited with medication secondary to hypotension. Type 2 diabetes mellitus with initial hyperglycemia.  All sugars have been good here despite hemoglobin A1c elevated 8.4. Seizure disorder on Keppra Arrhythmia history on amiodarone CAD and peripheral vascular disease on aspirin and statin Secondary hyperparathyroidism Stage II sacral decubiti first documented on 07/16/2021.  Partial thickness loss of dermis presenting as shallow open ulcer with pink wound bed without slough.  Local wound care. Check pulse ox on room air to see if we can get him off oxygen since had a complete dialysis session. The patient is saturating 100% on 2L by nasal cannula. Wean as possible. Anemia of chronic disease. Hemoglobin stable at 9.0.  I have seen and examined this patient myself. I have spent 34 minutes in his evaluation and care.  DVT prophylaxis: Heparin Code Status: DNR Family Communication: None available Disposition Plan: SNF when medically stable    Kaeo Jacome, DO [facility] Direct contact: see [email or web address]  7PM-7AM  contact night coverage as above 07/19/2021, 7:06 PM  LOS: 22 days

## 2021-07-19 NOTE — Progress Notes (Signed)
OT Cancellation Note  Patient Details Name: Ernest [name] MRN: [id number] DOB: [birth year]   Cancelled Treatment:    Reason Eval/Treat Not Completed: Patient at procedure or test/ unavailable Pt unavailable, not in room. OT to reassess as able.  Gerrianne Scale, [place], OTR/L ascom [phone] 07/19/21, 10:38 AM

## 2021-07-19 NOTE — Progress Notes (Signed)
[facility]  PROGRESS NOTE   Subjective:   Patient seen and evaluated during dialysis   HEMODIALYSIS FLOWSHEET:  Blood Flow Rate (mL/min): 400 mL/min Arterial Pressure (mmHg): -240 mmHg Venous Pressure (mmHg): 180 mmHg Transmembrane Pressure (mmHg): 60 mmHg Ultrafiltration Rate (mL/min): 170 mL/min Dialysate Flow Rate (mL/min): 500 ml/min Conductivity: Machine : 13.7 Conductivity: Machine : 13.7 Dialysis Fluid Bolus: Normal Saline Bolus Amount (mL): 250 mL  No complaints this time Denies pain   Objective:  Vital signs in last 24 hours:  Temp:  [97.1 F (36.2 C)-98.8 F (37.1 C)] 98.1 F (36.7 C) (10/05 1328) Pulse Rate:  [61-81] 62 (10/05 0824) Resp:  [11-20] 13 (10/05 1328) BP: (95-160)/(42-106) 95/59 (10/05 1325) SpO2:  [98 %-100 %] 100 % (10/05 1200)  Weight change:  Filed Weights   07/17/21 0456 07/17/21 0834 07/18/21 0500  Weight: 94.2 kg 94.2 kg 85.5 kg    Intake/Output: I/O last 3 completed shifts: In: 320 [P.O.:320] Out: -    Intake/Output this shift:  Total I/O In: -  Out: 1 [Stool:1]  Physical Exam: General:  No acute distress  Head:  Normocephalic, atraumatic. Moist oral mucosal membranes  Eyes:  Anicteric  Lungs:   Clear to auscultation, normal effort, [place] O2  Heart:  S1S2 no rubs  Abdomen:   Soft, nontender, bowel sounds present  Extremities:  No peripheral edema.  Right AKA on 07/08/21  Neurologic:  Awake, alert, following commands  Skin:  No lesions  Access: Rt groin Permcath    Basic Metabolic Panel: Recent Labs  Lab 07/14/21 1303 07/15/21 1154  NA 135 133*  K 5.0 4.5  CL 97* 94*  CO2 23 26  GLUCOSE 129* 128*  BUN 58* 43*  CREATININE 9.34* 6.93*  CALCIUM 8.4* 8.5*     CBC: Recent Labs  Lab 07/14/21 1303 07/15/21 1154 07/18/21 0437  WBC 9.8 10.1 7.6  HGB 8.7* 8.3* 9.0*  HCT 27.1* 25.8* 28.5*  MCV 85.2 86.0 86.9  PLT 346 325 332      Urinalysis: No results for input(s): COLORURINE, LABSPEC,  PHURINE, GLUCOSEU, HGBUR, BILIRUBINUR, KETONESUR, PROTEINUR, UROBILINOGEN, NITRITE, LEUKOCYTESUR in the last 72 hours.  Invalid input(s): APPERANCEUR    Imaging: No results found.   Medications:    sodium chloride       acetaminophen  1,000 mg Oral TID   allopurinol  100 mg Oral Daily   amiodarone  200 mg Oral Daily   aspirin EC  81 mg Oral Daily   atorvastatin  40 mg Oral Daily   Chlorhexidine Gluconate Cloth  6 each Topical Q0600   docusate sodium  100 mg Oral Daily   epoetin (EPOGEN/PROCRIT) injection  10,000 Units Intravenous Q M,W,F-HD   heparin injection (subcutaneous)  5,000 Units Subcutaneous Q8H   insulin aspart  0-5 Units Subcutaneous QHS   insulin aspart  0-6 Units Subcutaneous TID WC   levETIRAcetam  500 mg Oral BID   levETIRAcetam  500 mg Oral Once per day on Mon Wed Fri   midodrine  10 mg Oral QID   mirtazapine  15 mg Oral QHS   pantoprazole  40 mg Oral Daily   senna-docusate  1 tablet Oral QHS   sertraline  25 mg Oral QHS   sevelamer carbonate  800 mg Oral TID WC    Assessment/ Plan:      Ernest [name] is a 75 year old male with history of hypertension, coronary artery disease, COPD, congestive heart failure, hepatitis C, hyperlipidemia, ESRD on hemodialysis  Monday Wednesday Friday.  He is now being admitted with history of acute respiratory failure and also sepsis.  Principal Problem:   ESRD (end stage renal disease) ([place]) Active Problems:   Type 2 diabetes mellitus with hyperglycemia, without long-term current use of insulin (HCC)   Seizure (HCC)   Acute on chronic combined systolic and diastolic CHF (congestive heart failure) (HCC)   Anemia of chronic disease   Sepsis (HCC)   PAD (peripheral artery disease) (HCC)   Wet gangrene (HCC)   S/P AKA (above knee amputation), right (HCC)   Hypotension   Pressure ulcer   Pressure injury of skin  [facility] [address]/MWF/Rt groin Permcath   #1: ESRD: Dialysis tolerated well today. No UF. Next  treatment scheduled for Friday   #2: Right lower extremity osteomyelitis: S/p right lower extremity angiogram.  S/p right AKA on 07/08/21.  Pain controlled  #3: Anemia with chronic kidney diease: Hgb 9.0. Continue EPO with dialysis treatments   #4: Secondary hyperparathyroidism: Calcium and phosphorus not at goal.  Sevelamer given with meals   #5: Hypotension: Soft BP, prescribed Midodrine.  Transferred briefly to ICU for pressors, but is now on midodrine '10mg'$  four times a day. BP 95/59    LOS: [facility] 10/5/20221:35 PM

## 2021-07-20 LAB — GLUCOSE, CAPILLARY
Glucose-Capillary: 104 mg/dL — ABNORMAL HIGH (ref 70–99)
Glucose-Capillary: 136 mg/dL — ABNORMAL HIGH (ref 70–99)
Glucose-Capillary: 108 mg/dL — ABNORMAL HIGH (ref 70–99)

## 2021-07-20 MED ORDER — PANTOPRAZOLE SODIUM 40 MG PO TBEC: 40.0000 mg | DELAYED_RELEASE_TABLET | Freq: Every day | ORAL | 0 refills | Status: DC

## 2021-07-20 MED ORDER — MIRTAZAPINE 15 MG PO TABS
15.0000 mg | ORAL_TABLET | Freq: Every day | ORAL | 0 refills | Status: DC
Start: 2021-07-20 — End: 2021-09-29

## 2021-07-20 MED ORDER — MIDODRINE HCL 10 MG PO TABS: 10.0000 mg | ORAL_TABLET | Freq: Four times a day (QID) | ORAL | 0 refills | Status: DC

## 2021-07-20 MED ORDER — TRAZODONE HCL 50 MG PO TABS: 25.0000 mg | ORAL_TABLET | Freq: Every evening | ORAL | 0 refills | Status: DC | PRN

## 2021-07-20 MED ORDER — ACETAMINOPHEN 500 MG PO TABS: 1000.0000 mg | ORAL_TABLET | Freq: Four times a day (QID) | ORAL | Status: DC | PRN

## 2021-07-20 NOTE — Progress Notes (Signed)
[facility] here to transport patient to [facility]. Handed over to [facility] staff.

## 2021-07-20 NOTE — Care Management Important Message (Signed)
Important Message  Patient Details  Name: Ernest [name] MRN: [id number] Date of Birth: [birth year]   Medicare Important Message Given:  Yes     Dannette Barbara 07/20/2021, 4:13 PM

## 2021-07-20 NOTE — Progress Notes (Signed)
Report called to Teacher, English as a foreign language at [facility].

## 2021-07-20 NOTE — TOC Progression Note (Addendum)
Transition of Care [facility]) - Progression Note    Patient Details  Name: Ernest [name] MRN: [id number] Date of Birth: 1946-09-02  Transition of Care [facility]) CM/SW [place], LCSW Phone Number: 07/20/2021, 9:02 AM  Clinical Narrative:  Insurance authorization approved. Sent secure chat to MD to notify.   12:12 pm: Left voicemail for daughter. Will set up transport when she calls back.  12:54 pm: Notified SNF admissions coordinator that patient will need hoyer pad when he goes to HD. Was able to get daughter on phone but she could not hear me. Appears to be bad service connection. Called again and left another voicemail.  Expected Discharge Plan: [place] Barriers to Discharge: Continued Medical Work up  Expected Discharge Plan and Services Expected Discharge Plan: [place] In-house Referral: Clinical Social Work   Post Acute Care Choice: [place] Living arrangements for the past 2 months: [place]                                       Social Determinants of Health (SDOH) Interventions    Readmission Risk Interventions Readmission Risk Prevention Plan 06/28/2021  Transportation Screening Complete  Medication Review Press photographer) Complete  PCP or Specialist appointment within 3-5 days of discharge Complete  HRI or Home Care Consult Complete  SW Recovery Care/Counseling Consult Complete  Palliative Care Screening Not [place] Complete  Some recent data might be hidden

## 2021-07-20 NOTE — Discharge Summary (Signed)
Physician Discharge Summary  CAMMRON ALLSOPP [id number] DOB: 12/11/1945 DOA: 06/26/2021  PCP: Patient, [name] (Inactive)  Admit date: 06/26/2021 Discharge date: 07/20/2021  Recommendations for Outpatient Follow-up:  Discharge to SNF for PT/OT rehab Continue MWF dialysis schedule Weigh patient daily and report weight gains of more than 3 lbs in one day or more than 5 lbs in one week to facility physician Off load sacrum Check random glucoses daily and as needed for suspected hypoglycemia   Contact information for follow-up providers     Kris Hartmann, NP Follow up in 3 week(s).   Specialty: Vascular Surgery Why: First post-op visit. Staple removal. Needs ABI. Contact information: [place] [address] [phone]              Contact information for after-discharge care     Destination     [facility] .   Service: Skilled Nursing Contact information: [address] [place] [phone]                      Discharge Diagnoses: Principal diagnosis is #1 S/P  AKA right lower extremity ESRD on HD Hypotension - on midodrine Chronic combined systolic and diastolic CHF DM II - diet controlled Seizure disorder Stage II sacral decubitus ulcer CAD PVD  Discharge Condition: Fair  Disposition: SNF  Diet recommendation: Renal with 1200 cc fluid restriction  Filed Weights   07/19/21 0957 07/19/21 1328 07/20/21 0402  Weight: 83.4 kg 83.1 kg 84.6 kg    History of present illness:  Ernest [name] is a 75 y.o. African-American male with medical history significant for end-stage renal disease on hemodialysis Monday Wednesday and Friday, chronic systolic CHF, COPD, coronary artery disease, DVT, GERD, gout, hepatitis C, hypertension, dyslipidemia and CVA, who presented to emergency room with acute onset of fever with associated chills that started today at the skilled nursing  facility.  The patient has a right heel decubitus ulcer for which has been getting oral antibiotics however the wound has been getting progressively worse.  His right heel pain has been sharp and throbbing.  He was noted to have leukocytosis at his nursing home.  His roommate tested positive for COVID-19 however the patient tested negative there.  He denies any cough or wheezing or dyspnea or chest pain or palpitations.  He was noted to be hypoxic however.  No nausea or vomiting or abdominal pain.  ED Course: Upon presentation to the ER, temperature 100.1 and pulse ox 90 was 97% on 4 L of O2 by nasal cannula, vital signs otherwise within normal.  Later on respiratory rate was up to 26.  Labs revealed mild hyperkalemia 5.3 and hyperglycemia 330 with a BUN of 62 and creatinine of 7.02 and albumin 2.4.  Lactic acid 3.1 and later 2.9 with procalcitonin of 13.2.  CBC showed leukocytosis 19.6 with neutrophilia and anemia.  INR was 1.4 with PT of 16.7 and PTT 34.  Influenza antigens and COVID-19 PCR came back negative.  Blood cultures were drawn.   EKG as reviewed by me : EKG showed normal sinus rhythm with a rate of 80 with prolonged PR interval left anterior fascicular block and abnormal R wave progression with T wave inversion laterally. Imaging: Portable chest x-ray showed chronic airspace disease throughout the mid and lower left lung with a stable chronic loculated left pleural effusion with no new acute findings. Right foot x-ray revealed soft tissue ulceration along  the right heel with adjacent area of cortical destruction noted in the right calcaneus suspicious for acute osteomyelitis.  The patient was given 2050 mL IV lactated ringer bolus, IV Zosyn and vancomycin.  He will be admitted to a med monitored bed for further evaluation and management.  Hospital Course:  Mr. Cheever has end-stage renal disease on hemodialysis Monday Wednesday Friday, multiple comorbidities as noted below, who presented on 26 June 2021 from his skilled nursing facility with the onset of fever with associated chills.  The patient was noted to have a right heel decubitus ulcer for which she had been getting oral antibiotics.  The wound has been getting progressively worse.  The patient developed wet gangrene and required above-the-knee amputation on 07/11/2021.  Postoperatively the patient has been noted to be hypotensive and somewhat lethargic and occasionally When arousable he does state that he feels he has some discomfort in his chest but cannot describe it as pain per se he does have any other complaint except postoperative pain.  We are asked to follow the patient due to his hypotension.  The patient was sent to the step down unit.   On 06/26/2021 the ap tient underwent echocardiogram that demonstrated EF of 50-55% with moderate LVH. Regional wall motion could not be evaluated. RV function was normal with normal RV size. PA pressure could not be evaluated.    He has been placed on midodrine 10 mg QID. Even so he continues to be hypotensive. He went to HD today, but no UF. The patient actually received a 250 cc bolus. Blood pressures remained low after he returned to his room.     He has been evaluated by PT/OT. They have recommended SNF placement. Patient is medically stable for discharge today.  Today's assessment: S: The patient is awake, alert, and oriented x 3. O: Vitals:  Vitals:   07/20/21 0402 07/20/21 0758  BP: 96/78 (!) 166/69  Pulse: 61 62  Resp: 18 16  Temp: 97.6 F (36.4 C) 97.9 F (36.6 C)  SpO2: 100% 100%   Constitutional:  Patient is awake, alert, and oriented x 3. No acute distress. Respiratory:  CTA bilaterally, no w/r/r.  Respiratory effort normal. No retractions or accessory muscle use Cardiovascular:  RRR, no m/r/g No LE extremity edema   Normal pedal pulses Abdomen:  Abdomen appears normal; no tenderness or masses No hernias No HSM Musculoskeletal:  Digits/nails BUE: no  clubbing, cyanosis, petechiae, infection exam of joints, bones, muscles of at least one of following: head/neck, RUE, LUE, RLE, LLE   Skin:  No rashes, lesions, ulcers palpation of skin: no induration or nodules Neurologic:  CN 2-12 intact Sensation all 4 extremities intact Psychiatric:  Mental status Mood, affect appropriate Orientation to person, place, time  judgment and insight appear intact     Discharge Instructions  Discharge Instructions     (HEART FAILURE PATIENTS) Call MD:  Anytime you have any of the following symptoms: 1) 3 pound weight gain in 24 hours or 5 pounds in 1 week 2) shortness of breath, with or without a dry hacking cough 3) swelling in the hands, feet or stomach 4) if you have to sleep on extra pillows at night in order to breathe.   Complete by: As directed    Activity as tolerated - No restrictions   Complete by: As directed    Call MD for:  persistant nausea and vomiting   Complete by: As directed    Call MD for:  redness,  tenderness, or signs of infection (pain, swelling, redness, odor or green/yellow discharge around incision site)   Complete by: As directed    Call MD for:  temperature >100.4   Complete by: As directed    Diet - low sodium heart healthy   Complete by: As directed    1200 cc fluid restriction   Discharge instructions   Complete by: As directed    Discharge to SNF for PT/OT rehab Continue MWF dialysis schedule Weigh patient daily. Record weights. Notify facility physician of weight gain of greater for more than 3 lbs a day or 5 lbs in a week.  Random glucose checks and as needed for suspected hypoglycemia.   Discharge wound care:   Complete by: As directed    Offload sacrum.   Increase activity slowly   Complete by: As directed       Allergies as of 07/20/2021   [name]      Medication List     STOP taking these medications    ciprofloxacin 500 MG tablet Commonly known as: CIPRO   doxycycline 100 MG EC  tablet Commonly known as: DORYX   insulin detemir 100 UNIT/ML injection Commonly known as: LEVEMIR   linaclotide 145 MCG Caps capsule Commonly known as: LINZESS       TAKE these medications    acetaminophen 500 MG tablet Commonly known as: TYLENOL Take 1,000 mg by mouth every 6 (six) hours as needed for mild pain or moderate pain.   allopurinol 100 MG tablet Commonly known as: ZYLOPRIM Take 100 mg by mouth daily.   amiodarone 200 MG tablet Commonly known as: PACERONE Take 200 mg by mouth daily.   aspirin EC 81 MG tablet Take 1 tablet (81 mg total) by mouth daily.   atorvastatin 40 MG tablet Commonly known as: LIPITOR Take 40 mg by mouth at bedtime.   docusate sodium 100 MG capsule Commonly known as: COLACE Take 100 mg by mouth daily.   feeding supplement (NEPRO CARB STEADY) Liqd Take 237 mLs by mouth 2 (two) times daily between meals.   feeding supplement (PRO-STAT 64) Liqd Take 30 mLs by mouth 3 (three) times daily with meals.   levETIRAcetam 500 MG tablet Commonly known as: KEPPRA Take 500 mg by mouth See admin instructions. Take 1 tablet ('500mg'$ ) by mouth twice daily every Tuesday, Thursday, Saturday and Sunday   levETIRAcetam 500 MG tablet Commonly known as: KEPPRA Take 500 mg by mouth See admin instructions. Take 1 tablet ('500mg'$ ) by mouth three times daily on Monday, Wednesday and Friday   midodrine 10 MG tablet Commonly known as: PROAMATINE Take 1 tablet (10 mg total) by mouth 4 (four) times daily.   mirtazapine 15 MG tablet Commonly known as: REMERON Take 1 tablet (15 mg total) by mouth at bedtime. What changed:  medication strength how much to take   pantoprazole 40 MG tablet Commonly known as: PROTONIX Take 1 tablet (40 mg total) by mouth daily. Start taking on: July 21, 2021   polyethylene glycol 17 g packet Commonly known as: MIRALAX / GLYCOLAX Take 17 g by mouth daily.   senna-docusate 8.6-50 MG tablet Commonly known as:  Senokot-S Take 1 tablet by mouth at bedtime.   sertraline 25 MG tablet Commonly known as: ZOLOFT Take 25 mg by mouth at bedtime.   sevelamer carbonate 800 MG tablet Commonly known as: RENVELA Take 800 mg by mouth 3 (three) times daily with meals.   traZODone 50 MG tablet Commonly known as:  DESYREL Take 0.5 tablets (25 mg total) by mouth at bedtime as needed for sleep.               Discharge Care Instructions  (From admission, onward)           Start     Ordered   07/20/21 0000  Discharge wound care:       Comments: Offload sacrum.   07/20/21 1047           [name]  The results of significant diagnostics from this hospitalization (including imaging, microbiology, ancillary and laboratory) are listed below for reference.    Significant Diagnostic Studies: MR ANKLE RIGHT WO CONTRAST  Result Date: 06/28/2021 CLINICAL DATA:  Large ulcer along the right heel with worsening pain. EXAM: MRI OF THE RIGHT ANKLE WITHOUT CONTRAST TECHNIQUE: Multiplanar, multisequence MR imaging of the ankle was performed. No intravenous contrast was administered. COMPARISON:  None. FINDINGS: TENDONS Peroneal: Peroneal longus tendon intact. Peroneal brevis intact. Posteromedial: Posterior tibial tendon intact. Flexor hallucis longus tendon intact. Flexor digitorum longus tendon intact. Anterior: Tibialis anterior tendon intact. Extensor hallucis longus tendon intact Extensor digitorum longus tendon intact. Achilles: Partial-thickness tear of the Achilles tendon along the medial calcaneal insertion. Plantar Fascia: Thickening of the medial band of the plantar fascia with increased signal consistent with plantar fasciitis. LIGAMENTS Lateral: Anterior talofibular ligament intact. Calcaneofibular ligament intact. Posterior talofibular ligament intact. Anterior and posterior tibiofibular ligaments intact. Medial: Deltoid ligament intact. Spring ligament intact. CARTILAGE Ankle Joint: No joint  effusion. Normal ankle mortise. No chondral defect. Subtalar Joints/Sinus Tarsi: Normal subtalar joints. No subtalar joint effusion. Normal sinus tarsi. Bones and Soft Tissue: Large wound overlying the posterior aspect of the calcaneus. Mild subcortical bone marrow edema in the posterior calcaneus most consistent with osteomyelitis. No acute fracture or dislocation. Incidental note made of an os naviculare. T2 hyperintense signal throughout the plantar musculature likely neurogenic. IMPRESSION: 1. Large wound overlying the posterior aspect of the calcaneus. Mild subcortical bone marrow edema in the posterior calcaneus most consistent with osteomyelitis. 2. Partial-thickness tear of the Achilles tendon along the medial calcaneal insertion. 3. Thickening of the medial band of the plantar fascia with increased signal consistent with plantar fasciitis. Electronically Signed   By: Kathreen Devoid M.D.   On: 06/28/2021 06:59   PERIPHERAL VASCULAR CATHETERIZATION  Result Date: 06/28/2021 See surgical note for result.  DG Chest Portable 1 View  Result Date: 06/27/2021 CLINICAL DATA:  Fever and pressure wounds with elevated white blood cell count. EXAM: PORTABLE CHEST 1 VIEW COMPARISON:  Feb 28, 2021 FINDINGS: Multiple sternal wires are seen. Chronic airspace disease is seen throughout the mid and lower left lung which is unchanged in appearance. A stable, chronic loculated left pleural effusion is noted. No pneumothorax is identified. Stable vascular crowding is seen along the infrahilar region. A stable coarsely calcified left thyroid nodule is seen. A femoral catheter is seen with its distal tip at the level of the diaphragm, to the right of midline. The heart size and mediastinal contours are within normal limits. Degenerative changes are seen within the thoracic spine. IMPRESSION: 1. Chronic airspace disease throughout the mid and lower left lung with a stable chronic loculated left pleural effusion. 2. No new or  acute findings. Electronically Signed   By: Virgina Norfolk M.D.   On: 06/27/2021 00:28   DG Foot Complete Right  Result Date: 06/27/2021 CLINICAL DATA:  Fever and pressure wounds with elevated white blood cell count. EXAM: RIGHT  FOOT COMPLETE - 3+ VIEW COMPARISON:  None. FINDINGS: There is no evidence of acute fracture or dislocation. Soft tissue ulcerations are seen along the right heel with adjacent area of cortical destruction noted along the right calcaneus. Marked severity vascular calcification is noted. IMPRESSION: Soft tissue ulcerations along the right heel with adjacent area of cortical destruction noted along the right calcaneus, suspicious for acute osteomyelitis. MRI correlation is recommended. Electronically Signed   By: Virgina Norfolk M.D.   On: 06/27/2021 00:30   ECHOCARDIOGRAM COMPLETE  Result Date: 06/27/2021    ECHOCARDIOGRAM REPORT   Patient Name:   PADEN HAYDEN Date of Exam: 06/27/2021 Medical Rec #:  [id number]       Height:       69.0 in Accession #:    [id number]      Weight:       180.8 lb Date of Birth:  November 06, 1945       BSA:          1.979 m Patient Age:    5 years        BP:           88/50 mmHg Patient Gender: M               HR:           67 bpm. Exam Location:  [facility] Procedure: 2D Echo, Color Doppler and Cardiac Doppler Indications:     CHF- acute systolic [id number]  History:         Patient has prior history of Echocardiogram examinations, most                  recent 09/13/2017. COPD; Risk Factors:Hypertension and                  Diabetes. DVT.  Sonographer:     Sherrie Sport Referring Phys:  [id number] JAN A MANSY Diagnosing Phys: Nelva Bush MD  Sonographer Comments: Suboptimal apical window. IMPRESSIONS  1. Left ventricular ejection fraction, by estimation, is 50 to 55%. The left ventricle has low normal function. Left ventricular endocardial border not optimally defined to evaluate regional wall motion. There is moderate left ventricular hypertrophy. Left ventricular  diastolic parameters are consistent with Grade II diastolic dysfunction (pseudonormalization). Elevated left atrial pressure.  2. Right ventricular systolic function is normal. The right ventricular size is normal. Tricuspid regurgitation signal is inadequate for assessing PA pressure.  3. Left atrial size was mildly dilated.  4. Right atrial size was mildly dilated.  5. The mitral valve is degenerative. Trivial mitral valve regurgitation. No evidence of mitral stenosis. Moderate to severe mitral annular calcification.  6. The aortic valve is tricuspid. There is mild calcification of the aortic valve. There is mild thickening of the aortic valve. Aortic valve regurgitation is not visualized. Mild to moderate aortic valve sclerosis/calcification is present, without any evidence of aortic stenosis.  7. Aortic dilatation noted. There is borderline dilatation of the aortic root, measuring 38 mm.  8. The inferior vena cava is normal in size with <50% respiratory variability, suggesting right atrial pressure of 8 mmHg. FINDINGS  Left Ventricle: Left ventricular ejection fraction, by estimation, is 50 to 55%. The left ventricle has low normal function. Left ventricular endocardial border not optimally defined to evaluate regional wall motion. The left ventricular internal cavity  size was normal in size. There is moderate left ventricular hypertrophy. Left ventricular diastolic parameters are consistent with Grade II diastolic dysfunction (pseudonormalization). Elevated left atrial pressure.  Right Ventricle: The right ventricular size is normal. No increase in right ventricular wall thickness. Right ventricular systolic function is normal. Tricuspid regurgitation signal is inadequate for assessing PA pressure. Left Atrium: Left atrial size was mildly dilated. Right Atrium: Right atrial size was mildly dilated. Pericardium: There is no evidence of pericardial effusion. Mitral Valve: The mitral valve is degenerative in  appearance. There is mild thickening of the mitral valve leaflet(s). Moderate to severe mitral annular calcification. Trivial mitral valve regurgitation. No evidence of mitral valve stenosis. Tricuspid Valve: The tricuspid valve is not well visualized. Tricuspid valve regurgitation is trivial. Aortic Valve: The aortic valve is tricuspid. There is mild calcification of the aortic valve. There is mild thickening of the aortic valve. Aortic valve regurgitation is not visualized. Mild to moderate aortic valve sclerosis/calcification is present, without any evidence of aortic stenosis. Aortic valve mean gradient measures 2.0 mmHg. Aortic valve peak gradient measures 3.6 mmHg. Aortic valve area, by VTI measures 3.00 cm. Pulmonic Valve: The pulmonic valve was not well visualized. Pulmonic valve regurgitation is not visualized. No evidence of pulmonic stenosis. Aorta: Aortic dilatation noted. There is borderline dilatation of the aortic root, measuring 38 mm. Pulmonary Artery: The pulmonary artery is not well seen. Venous: The inferior vena cava is normal in size with less than 50% respiratory variability, suggesting right atrial pressure of 8 mmHg. IAS/Shunts: The interatrial septum was not well visualized.  LEFT VENTRICLE PLAX 2D LVIDd:         4.06 cm  Diastology LVIDs:         3.01 cm  LV e' medial:    4.35 cm/s LV PW:         1.37 cm  LV E/e' medial:  21.3 LV IVS:        1.35 cm  LV e' lateral:   8.92 cm/s LVOT diam:     2.10 cm  LV E/e' lateral: 10.4 LV SV:         49 LV SV Index:   25 LVOT Area:     3.46 cm  LEFT ATRIUM             Index       RIGHT ATRIUM           Index LA diam:        3.50 cm 1.77 cm/m  RA Area:     18.40 cm LA Vol (A2C):   69.9 ml 35.32 ml/m RA Volume:   55.40 ml  27.99 ml/m LA Vol (A4C):   68.3 ml 34.51 ml/m LA Biplane Vol: 69.3 ml 35.02 ml/m  AORTIC VALVE                   PULMONIC VALVE AV Area (Vmax):    2.75 cm    PV Vmax:        0.61 m/s AV Area (Vmean):   2.37 cm    PV Peak grad:    1.5 mmHg AV Area (VTI):     3.00 cm    RVOT Peak grad: 2 mmHg AV Vmax:           94.70 cm/s AV Vmean:          65.200 cm/s AV VTI:            0.164 m AV Peak Grad:      3.6 mmHg AV Mean Grad:      2.0 mmHg LVOT Vmax:         75.10 cm/s  LVOT Vmean:        44.700 cm/s LVOT VTI:          0.142 m LVOT/AV VTI ratio: 0.87  AORTA Ao Root diam: 3.80 cm MITRAL VALVE MV Area (PHT): 3.63 cm    SHUNTS MV Decel Time: 209 msec    Systemic VTI:  0.14 m MV E velocity: 92.50 cm/s  Systemic Diam: 2.10 cm MV A velocity: 84.00 cm/s MV E/A ratio:  1.10 Nelva Bush MD Electronically signed by Nelva Bush MD Signature Date/Time: 06/27/2021/4:51:56 PM    Final     Microbiology: Recent Results (from the past 240 hour(s))  Resp Panel by RT-PCR (Flu A&B, Covid) Nasopharyngeal Swab     Status: None   Collection Time: 07/18/21  2:00 PM   Specimen: Nasopharyngeal Swab; Nasopharyngeal(NP) swabs in vial transport medium  Result Value Ref Range Status   SARS Coronavirus 2 by RT PCR NEGATIVE NEGATIVE Final    Comment: (NOTE) SARS-CoV-2 target nucleic acids are NOT DETECTED.  The SARS-CoV-2 RNA is generally detectable in upper respiratory specimens during the acute phase of infection. The lowest concentration of SARS-CoV-2 viral copies this assay can detect is 138 copies/mL. A negative result does not preclude SARS-Cov-2 infection and should not be used as the sole basis for treatment or other patient management decisions. A negative result may occur with  improper specimen collection/handling, submission of specimen other than nasopharyngeal swab, presence of viral mutation(s) within the areas targeted by this assay, and inadequate number of viral copies(<138 copies/mL). A negative result must be combined with clinical observations, patient history, and epidemiological information. The expected result is Negative.  Fact Sheet for Patients:  EntrepreneurPulse.com.au  Fact Sheet for Healthcare  Providers:  IncredibleEmployment.be  This test is no t yet approved or cleared by the [place] FDA and  has been authorized for detection and/or diagnosis of SARS-CoV-2 by FDA under an Emergency Use Authorization (EUA). This EUA will remain  in effect (meaning this test can be used) for the duration of the COVID-19 declaration under Section 564(b)(1) of the Act, 21 U.S.C.section 360bbb-3(b)(1), unless the authorization is terminated  or revoked sooner.       Influenza A by PCR NEGATIVE NEGATIVE Final   Influenza B by PCR NEGATIVE NEGATIVE Final    Comment: (NOTE) The Xpert Xpress SARS-CoV-2/FLU/RSV plus assay is intended as an aid in the diagnosis of influenza from Nasopharyngeal swab specimens and should not be used as a sole basis for treatment. Nasal washings and aspirates are unacceptable for Xpert Xpress SARS-CoV-2/FLU/RSV testing.  Fact Sheet for Patients: EntrepreneurPulse.com.au  Fact Sheet for Healthcare Providers: IncredibleEmployment.be  This test is not yet approved or cleared by the [place] FDA and has been authorized for detection and/or diagnosis of SARS-CoV-2 by FDA under an Emergency Use Authorization (EUA). This EUA will remain in effect (meaning this test can be used) for the duration of the COVID-19 declaration under Section 564(b)(1) of the Act, 21 U.S.C. section 360bbb-3(b)(1), unless the authorization is terminated or revoked.  Performed at [facility], [address] [place], [place] [address]      Labs: Basic Metabolic Panel: Recent Labs  Lab 07/14/21 1303 07/15/21 1154  NA 135 133*  K 5.0 4.5  CL 97* 94*  CO2 23 26  GLUCOSE 129* 128*  BUN 58* 43*  CREATININE 9.34* 6.93*  CALCIUM 8.4* 8.5*   Liver Function Tests: No results for input(s): AST, ALT, ALKPHOS, BILITOT, PROT, ALBUMIN in the last 168  hours. No results for input(s): LIPASE, AMYLASE in the last 168  hours. No results for input(s): AMMONIA in the last 168 hours. CBC: Recent Labs  Lab 07/14/21 1303 07/15/21 1154 07/18/21 0437  WBC 9.8 10.1 7.6  HGB 8.7* 8.3* 9.0*  HCT 27.1* 25.8* 28.5*  MCV 85.2 86.0 86.9  PLT 346 325 332   Cardiac Enzymes: No results for input(s): CKTOTAL, CKMB, CKMBINDEX, TROPONINI in the last 168 hours. BNP: BNP (last 3 results) No results for input(s): BNP in the last 8760 hours.  ProBNP (last 3 results) No results for input(s): PROBNP in the last 8760 hours.  CBG: Recent Labs  Lab 07/19/21 0826 07/19/21 1400 07/19/21 1648 07/19/21 2200 07/20/21 0800  GLUCAP 96 80 90 152* 104*    Principal Problem:   ESRD (end stage renal disease) (HCC) Active Problems:   Type 2 diabetes mellitus with hyperglycemia, without long-term current use of insulin (HCC)   Seizure (HCC)   Acute on chronic combined systolic and diastolic CHF (congestive heart failure) (HCC)   Anemia of chronic disease   Sepsis (HCC)   PAD (peripheral artery disease) (HCC)   Wet gangrene (HCC)   S/P AKA (above knee amputation), right (HCC)   Hypotension   Pressure ulcer   Pressure injury of skin   Time coordinating discharge: 38 minutes  Signed:        Niko Penson, DO [facility]  07/20/2021, 10:49 AM

## 2021-07-20 NOTE — Progress Notes (Signed)
[facility]  PROGRESS NOTE   Subjective:   Patient seen resting in bed Alert and oriented Tolerating meals Denies nausea and vomiting Denies shortness of breath   Objective:  Vital signs in last 24 hours:  Temp:  [97.6 F (36.4 C)-98.8 F (37.1 C)] 97.9 F (36.6 C) (10/06 0758) Pulse Rate:  [61-89] 62 (10/06 0758) Resp:  [13-23] 16 (10/06 0758) BP: (91-166)/(29-78) 166/69 (10/06 0758) SpO2:  [97 %-100 %] 100 % (10/06 0758) Weight:  [83.1 kg-84.6 kg] 84.6 kg (10/06 0402)  Weight change:  Filed Weights   07/19/21 0957 07/19/21 1328 07/20/21 0402  Weight: 83.4 kg 83.1 kg 84.6 kg    Intake/Output: I/O last 3 completed shifts: In: -  Out: 1 [Stool:1]   Intake/Output this shift:  Total I/O In: 120 [P.O.:120] Out: -   Physical Exam: General:  No acute distress  Head:  Normocephalic, atraumatic. Moist oral mucosal membranes  Eyes:  Anicteric  Lungs:   Clear to auscultation, normal effort, [place] O2  Heart:  S1S2 no rubs  Abdomen:   Soft, nontender, bowel sounds present  Extremities:  No peripheral edema.  Right AKA on 07/08/21  Neurologic:  Awake, alert, following commands  Skin:  No lesions  Access: Rt groin Permcath    Basic Metabolic Panel: Recent Labs  Lab 07/14/21 1303 07/15/21 1154  NA 135 133*  K 5.0 4.5  CL 97* 94*  CO2 23 26  GLUCOSE 129* 128*  BUN 58* 43*  CREATININE 9.34* 6.93*  CALCIUM 8.4* 8.5*     CBC: Recent Labs  Lab 07/14/21 1303 07/15/21 1154 07/18/21 0437  WBC 9.8 10.1 7.6  HGB 8.7* 8.3* 9.0*  HCT 27.1* 25.8* 28.5*  MCV 85.2 86.0 86.9  PLT 346 325 332      Urinalysis: No results for input(s): COLORURINE, LABSPEC, PHURINE, GLUCOSEU, HGBUR, BILIRUBINUR, KETONESUR, PROTEINUR, UROBILINOGEN, NITRITE, LEUKOCYTESUR in the last 72 hours.  Invalid input(s): APPERANCEUR    Imaging: No results found.   Medications:    sodium chloride       acetaminophen  1,000 mg Oral TID   allopurinol  100 mg Oral Daily    amiodarone  200 mg Oral Daily   aspirin EC  81 mg Oral Daily   atorvastatin  40 mg Oral Daily   Chlorhexidine Gluconate Cloth  6 each Topical Q0600   docusate sodium  100 mg Oral Daily   epoetin (EPOGEN/PROCRIT) injection  10,000 Units Intravenous Q M,W,F-HD   heparin injection (subcutaneous)  5,000 Units Subcutaneous Q8H   insulin aspart  0-5 Units Subcutaneous QHS   insulin aspart  0-6 Units Subcutaneous TID WC   levETIRAcetam  500 mg Oral BID   levETIRAcetam  500 mg Oral Once per day on Mon Wed Fri   midodrine  10 mg Oral QID   mirtazapine  15 mg Oral QHS   pantoprazole  40 mg Oral Daily   senna-docusate  1 tablet Oral QHS   sertraline  25 mg Oral QHS   sevelamer carbonate  800 mg Oral TID WC    Assessment/ Plan:      Ernest [name] is a 75 year old male with history of hypertension, coronary artery disease, COPD, congestive heart failure, hepatitis C, hyperlipidemia, ESRD on hemodialysis Monday Wednesday Friday.  He is now being admitted with history of acute respiratory failure and also sepsis.  Principal Problem:   ESRD (end stage renal disease) ([place]) Active Problems:   Type 2 diabetes mellitus with hyperglycemia, without long-term  current use of insulin (HCC)   Seizure ([place])   Acute on chronic combined systolic and diastolic CHF (congestive heart failure) (HCC)   Anemia of chronic disease   Sepsis ([place])   PAD (peripheral artery disease) (HCC)   Wet gangrene (HCC)   S/P AKA (above knee amputation), right (HCC)   Hypotension   Pressure ulcer   Pressure injury of skin  [facility] [address]/MWF/Rt groin Permcath   #1: ESRD: Received dialysis yesterday. No UF, tolerated well. Next treatment scheduled for Friday. If discharged, patient can return to outpatient clinic to resume treatments.   #2: Right lower extremity osteomyelitis: S/p right lower extremity angiogram.  S/p right AKA on 07/08/21.  Pain well controlled  #3: Anemia with chronic kidney diease: Hgb 9.0.  Continue EPO with dialysis treatments   #4: Secondary hyperparathyroidism: Calcium improving and phosphorus not at goal.  Sevelamer given with meals   #5: Hypotension: prescribed Midodrine '10mg'$  four times a day.BP elevated this morning to 166/69. BP was elevated yesterday during dialysis also.    LOS: [facility] 10/6/202212:40 PM

## 2021-07-20 NOTE — TOC Transition Note (Signed)
Transition of Care [facility]) - CM/SW Discharge Note   Patient Details  Name: Ernest [name] MRN: [id number] Date of Birth: [birth year]  Transition of Care [facility]) CM/SW Contact:  Candie Chroman, LCSW Phone Number: 07/20/2021, 1:58 PM   Clinical Narrative: Patient has orders to discharge to [facility] today. RN will call report to [phone] (Room [address] on [address]). EMS transport has been arranged and he is 2nd on the list. No further concerns. CSW signing off.  Final next level of care: Skilled Nursing Facility Barriers to Discharge: Barriers Resolved   Patient Goals and CMS Choice Patient states their goals for this hospitalization and ongoing recovery are:: for pt to return to SNF- per pt's daughter in law   Choice offered to / list presented to : Adult Children  Discharge Placement   Existing PASRR number confirmed : 07/17/21          Patient chooses bed at: [facility] Patient to be transferred to facility by: EMS Name of family member notified: [name] Patient and family notified of of transfer: 07/20/21  Discharge Plan and Services In-house Referral: Clinical Social Work   Post Acute Care Choice: [place]                               Social Determinants of Health (SDOH) Interventions     Readmission Risk Interventions Readmission Risk Prevention Plan 06/28/2021  Transportation Screening Complete  Medication Review Press photographer) Complete  PCP or Specialist appointment within 3-5 days of discharge Complete  HRI or Home Care Consult Complete  SW Recovery Care/Counseling Consult Complete  Palliative Care Screening Not [place] Complete  Some recent data might be hidden

## 2021-07-21 DIAGNOSIS — Z89611 Acquired absence of right leg above knee: Secondary | ICD-10-CM | POA: Insufficient documentation

## 2021-08-04 ENCOUNTER — Emergency Department
Admission: EM | Admit: 2021-08-04 | Discharge: 2021-08-04 | Disposition: A | Discharge status: Home / Self Care | Payer: Medicare (Managed Care) | Attending: Emergency Medicine | Admitting: Emergency Medicine

## 2021-08-04 ENCOUNTER — Other Ambulatory Visit: Payer: Self-pay

## 2021-08-04 DIAGNOSIS — E1122 Type 2 diabetes mellitus with diabetic chronic kidney disease: Secondary | ICD-10-CM | POA: Diagnosis not present

## 2021-08-04 DIAGNOSIS — Z7982 Long term (current) use of aspirin: Secondary | ICD-10-CM | POA: Insufficient documentation

## 2021-08-04 DIAGNOSIS — I132 Hypertensive heart and chronic kidney disease with heart failure and with stage 5 chronic kidney disease, or end stage renal disease: Secondary | ICD-10-CM | POA: Insufficient documentation

## 2021-08-04 DIAGNOSIS — I5043 Acute on chronic combined systolic (congestive) and diastolic (congestive) heart failure: Secondary | ICD-10-CM | POA: Insufficient documentation

## 2021-08-04 DIAGNOSIS — F0390 Unspecified dementia without behavioral disturbance: Secondary | ICD-10-CM | POA: Insufficient documentation

## 2021-08-04 DIAGNOSIS — Z87891 Personal history of nicotine dependence: Secondary | ICD-10-CM | POA: Insufficient documentation

## 2021-08-04 DIAGNOSIS — J449 Chronic obstructive pulmonary disease, unspecified: Secondary | ICD-10-CM | POA: Insufficient documentation

## 2021-08-04 DIAGNOSIS — Z992 Dependence on renal dialysis: Secondary | ICD-10-CM | POA: Insufficient documentation

## 2021-08-04 DIAGNOSIS — Z951 Presence of aortocoronary bypass graft: Secondary | ICD-10-CM | POA: Diagnosis not present

## 2021-08-04 DIAGNOSIS — Z79899 Other long term (current) drug therapy: Secondary | ICD-10-CM | POA: Insufficient documentation

## 2021-08-04 DIAGNOSIS — M545 Low back pain, unspecified: Secondary | ICD-10-CM | POA: Diagnosis not present

## 2021-08-04 DIAGNOSIS — I2510 Atherosclerotic heart disease of native coronary artery without angina pectoris: Secondary | ICD-10-CM | POA: Insufficient documentation

## 2021-08-04 DIAGNOSIS — N186 End stage renal disease: Secondary | ICD-10-CM | POA: Insufficient documentation

## 2021-08-04 MED ORDER — HYDROMORPHONE HCL 1 MG/ML IJ SOLN
0.5000 mg | Freq: Once | INTRAMUSCULAR | Status: AC
  Administered 2021-08-04: 0.5 mg via INTRAMUSCULAR
  Filled 2021-08-04: qty 1

## 2021-08-04 NOTE — ED Notes (Signed)
See triage note  presents with lower back pian states back pain is chronic  but started again this am prior to dialysis

## 2021-08-04 NOTE — ED Provider Notes (Signed)
[facility] Emergency Department Provider Note   ____________________________________________   Event Date/Time   First MD Initiated Contact with Patient 08/04/21 [phone]     (approximate)  I have reviewed the triage vital signs and the nursing notes.   HISTORY  Chief Complaint Back Pain    HPI Ernest [name] is a 75 y.o. male patient arrived via EMS from dialysis.  Patient is a resident of [facility] patient complaining of low back pain prior to procedure and he was sent by EMS to the emergency room.  No provocative incident for complaint.  Dialysis  will reschedule him tomorrow for procedure.  Patient has recent below-knee amputation on the right lower extremity.  Patient does not bear weight.     Past Medical History:  Diagnosis Date   Abnormal stress test    s/p cath November 2013 with modest disease involving the ostial left main, proximal LAD, proximal RCA - do not appear to be hemodynamically signficant; mild LV dysfunction   Anemia    low iron   Anxiety    Arthritis    Bacteremia    Chronic systolic CHF (congestive heart failure) (HCC)    COPD (chronic obstructive pulmonary disease) ([place])    Coronary artery disease    a. per cath report 2013, b. 09/13/2017-CABG X4 & Mitral valve repair   Depression    DVT (deep venous thrombosis) (HCC)    arm - right- finished blood thinner- not sure when it   Ejection fraction < 50%    Erectile dysfunction    penile implant   ESRD (end stage renal disease) ([place])    [facility] on a MWF schedule.      Gout    Hx: of   Hepatitis C    History of seizures    last one 2018   Hyperlipidemia    Hypertension    Mitral valve disease    s/p Mitral repair 09/13/17   Obesity    Retinopathy    Shortness of breath    Hx: of with exertion   Stroke [facility])    "several" left side weakness, speech affected   Type II or unspecified type diabetes mellitus without mention of complication, not stated as  uncontrolled    adult onset    Patient Active Problem List   Diagnosis Date Noted   Pressure injury of skin 07/19/2021   Pressure ulcer 07/16/2021   Hypotension    Wet gangrene ([place]) 07/10/2021   S/P AKA (above knee amputation), right ([place]) 07/10/2021   PAD (peripheral artery disease) ([name]) 07/08/2021   Sepsis ([place]) 06/27/2021   Disorder of phosphorus metabolism, unspecified 02/20/2021   Hypercalcemia 12/08/2020   Atherosclerotic heart disease of native coronary artery without angina pectoris 11/18/2020   Diarrhea, unspecified 11/18/2020   Fever, unspecified 11/18/2020   Other fluid overload 11/18/2020   Pleural effusion on left 09/30/2019   Acute respiratory failure with hypoxia ([place]) 09/30/2019   Pleural effusion 09/30/2019   Chest pain 12/02/2018   Vascular dementia ([place]) 10/10/2017   Paroxysmal atrial flutter ([place]) 10/10/2017   Altered mental status, unspecified 10/09/2017   Other malaise 10/09/2017   S/P CABG x 4 10/04/2017   Cerebral embolism with cerebral infarction 09/20/2017   NSVT (nonsustained ventricular tachycardia)    Dyspnea 09/08/2017   Non-ST elevation MI (NSTEMI) ([place])    Acute on chronic combined systolic and diastolic CHF (congestive heart failure) ([place])    End-stage renal disease on hemodialysis ([place])  Left leg weakness    TIA (transient ischemic attack) 10/27/2016   Dependence on renal dialysis ([place]) 08/09/2015   History of CVA with residual deficit 08/07/2015   Seizure, late effect of stroke ([place]) 08/07/2015   Diabetic peripheral neuropathy ([place]) 08/07/2015   Unspecified protein-calorie malnutrition ([place]) 05/27/2015   Seizure ([place]) 04/29/2015   Coagulation defect, unspecified ([place]) 07/16/2014   Type 2 diabetes mellitus with diabetic peripheral angiopathy without gangrene ([place]) 07/01/2014   Pruritus, unspecified 05/26/2014   Elevated prostate specific antigen (PSA) 08/03/2013   Encounter for immunization 07/10/2013   Obstructive sleep apnea  06/16/2013   Hyperlipidemia associated with type 2 diabetes mellitus ([place])    ESRD (end stage renal disease) ([place])    Erectile dysfunction associated with type 2 diabetes mellitus ([place])    Hypertension due to end stage renal disease caused by type 2 diabetes mellitus, on dialysis [facility])    COPD (chronic obstructive pulmonary disease) ([place])    Chronic combined systolic and diastolic CHF (congestive heart failure) ([place])    Anemia of chronic disease 12/27/2011   Long term (current) use of insulin ([place]) 11/03/2009   Gout, unspecified 02/28/2009   Iron deficiency anemia, unspecified 10/30/2008   Secondary hyperparathyroidism of renal origin ([place]) 10/30/2008   Type 2 diabetes mellitus with hyperglycemia, without long-term current use of insulin ([place]) 06/08/1984    Past Surgical History:  Procedure Laterality Date   A/V FISTULAGRAM N/A 10/04/2017   Procedure: A/V FISTULAGRAM;  Surgeon: Elam Dutch, MD;  Location: [place] [facility];  Service: Cardiovascular;  Laterality: N/A;   A/V SHUNTOGRAM Left 07/12/2020   Procedure: A/V SHUNTOGRAM;  Surgeon: Serafina Mitchell, MD;  Location: [place] [facility];  Service: Cardiovascular;  Laterality: Left;   AMPUTATION Right 07/08/2021   Procedure: AMPUTATION ABOVE KNEE;  Surgeon: Elmore Guise, MD;  Location: [facility];  Service: Vascular;  Laterality: Right;   ANGIOPLASTY Left 09/24/2019   Procedure: Angioplasty Innominate Vien;  Surgeon: Serafina Mitchell, MD;  Location: [place];  Service: Vascular;  Laterality: Left;   AV FISTULA PLACEMENT Left 01/13/2013   Procedure: INSERTION OF ARTERIOVENOUS (AV) GORE-TEX GRAFT ARM;  Surgeon: Angelia Mould, MD;  Location: [place];  Service: Vascular;  Laterality: Left;   AV FISTULA PLACEMENT Left 05/05/2013   Procedure: INSERTION OF LEFT UPPER ARM  ARTERIOVENOUS GORTEX GRAFT;  Surgeon: Angelia Mould, MD;  Location: [place];  Service: Vascular;  Laterality: Left;   AV FISTULA PLACEMENT Left 11/26/2019    Procedure: INSERTION OF ARTERIOVENOUS (AV) GORE-TEX GRAFT, left  ARM;  Surgeon: Serafina Mitchell, MD;  Location: [place];  Service: Vascular;  Laterality: Left;   [place] REMOVAL Left 03/26/2013   Procedure: REMOVAL OF  NON INCORPORATED ARTERIOVENOUS GORETEX GRAFT ([place]) left arm * repair of  left brachial artery with vein patch angioplasty.;  Surgeon: Angelia Mould, MD;  Location: [place];  Service: Vascular;  Laterality: Left;   CARDIAC CATHETERIZATION  August 26, 2012   CATARACT EXTRACTION W/ INTRAOCULAR LENS  IMPLANT, BILATERAL     COLONOSCOPY     CORONARY ARTERY BYPASS GRAFT N/A 09/13/2017   Procedure: CORONARY ARTERY BYPASS GRAFTING (CABG) times four LIMA  to LAD, SVG sequentially to OM and RAMUS Intermediate and SVG to Acute Marginal;  Surgeon: Melrose Nakayama, MD;  Location: [place];  Service: Open Heart Surgery;  Laterality: N/A;   DIALYSIS/PERMA CATHETER INSERTION N/A 02/28/2021   Procedure: DIALYSIS/PERMA CATHETER EXCHANGE;  Surgeon: Katha Cabal, MD;  Location:  [place] [facility];  Service: Cardiovascular;  Laterality: N/A;   EMBOLECTOMY Right 08/27/2013   Procedure: EMBOLECTOMY;  Surgeon: Mal Misty, MD;  Location: [place];  Service: Vascular;  Laterality: Right;  Thrombectomy of Radial and ulnar artery.   ENDOVASCULAR STENT INSERTION Left 09/24/2019   Procedure: Endovascular Stent Graft Insertion, Arm Graft;  Surgeon: Serafina Mitchell, MD;  Location[facility];  Service: Vascular;  Laterality: Left;   EYE SURGERY     laser.  and surgery for DM   fistula     RUE and wrist   FISTULOGRAM Left 09/24/2019   Procedure: SHUNTOGRAM OF ARTERIOVENOUS FISTULA LEFT ARM,;  Surgeon: Serafina Mitchell, MD;  Location: [place];  Service: Vascular;  Laterality: Left;   INSERTION OF DIALYSIS CATHETER Right 01/01/2013   Procedure: INSERTION OF DIALYSIS CATHETER right  internal jugular;  Surgeon: Serafina Mitchell, MD;  Location: [place];  Service: Vascular;  Laterality: Right;   INSERTION OF  DIALYSIS CATHETER N/A 03/26/2013   Procedure: INSERTION OF DIALYSIS CATHETER Left internal jugular vein;  Surgeon: Angelia Mould, MD;  Location: [place];  Service: Vascular;  Laterality: N/A;   INSERTION OF DIALYSIS CATHETER Right 11/26/2019   Procedure: INSERTION OF DIALYSIS CATHETER, right internal jugular;  Surgeon: Serafina Mitchell, MD;  Location: [place];  Service: Vascular;  Laterality: Right;   IR THORACENTESIS ASP PLEURAL SPACE W/IMG GUIDE  09/30/2019   LEFT HEART CATH AND CORONARY ANGIOGRAPHY N/A 09/09/2017   Procedure: LEFT HEART CATH AND CORONARY ANGIOGRAPHY;  Surgeon: Troy Sine, MD;  Location: [place] [facility];  Service: Cardiovascular;  Laterality: N/A;   LIGATION OF ARTERIOVENOUS  FISTULA Right 12/30/2013   Procedure: REMOVAL OF SEGMENT OF GORTEX GRAFT AND FISTULA  AND REPAIR OF BRACHIAL ARTERY;  Surgeon: Mal Misty, MD;  Location: [place];  Service: Vascular;  Laterality: Right;   LOWER EXTREMITY ANGIOGRAPHY N/A 06/28/2021   Procedure: Lower Extremity Angiography;  Surgeon: Katha Cabal, MD;  Location: [place] [facility];  Service: Cardiovascular;  Laterality: N/A;   MITRAL VALVE REPAIR N/A 09/13/2017   Procedure: MITRAL VALVE REPAIR (MVR);  Surgeon: Melrose Nakayama, MD;  Location: [place];  Service: Open Heart Surgery;  Laterality: N/A;   MITRAL VALVE REPAIR (MV)/CORONARY ARTERY BYPASS GRAFTING (CABG)  09/13/2017   PENILE PROSTHESIS IMPLANT     1997.. no card   PERIPHERAL VASCULAR BALLOON ANGIOPLASTY Left 10/04/2017   Procedure: PERIPHERAL VASCULAR BALLOON ANGIOPLASTY;  Surgeon: Elam Dutch, MD;  Location: [place] [facility];  Service: Cardiovascular;  Laterality: Left;  AVF   PERIPHERAL VASCULAR BALLOON ANGIOPLASTY Left 07/12/2020   Procedure: PERIPHERAL VASCULAR BALLOON ANGIOPLASTY;  Surgeon: Serafina Mitchell, MD;  Location: [place] [facility];  Service: Cardiovascular;  Laterality: Left;  ARM SHUNTOGRAM   REVISION OF ARTERIOVENOUS GORETEX GRAFT Left  09/24/2019   Procedure: REVISION OF ARTERIOVENOUS GORETEX GRAFT LEFT ARM;  Surgeon: Serafina Mitchell, MD;  [facility];  Service: Vascular;  Laterality: Left;   TEE WITHOUT CARDIOVERSION N/A 06/11/2013   Procedure: TRANSESOPHAGEAL ECHOCARDIOGRAM (TEE);  Surgeon: Larey Dresser, MD;  Location: [facility];  Service: Cardiovascular;  Laterality: N/A;   TEE WITHOUT CARDIOVERSION N/A 09/13/2017   Procedure: TRANSESOPHAGEAL ECHOCARDIOGRAM (TEE);  Surgeon: Melrose Nakayama, MD;  Location: [place];  Service: Open Heart Surgery;  Laterality: N/A;   THROMBECTOMY W/ EMBOLECTOMY Left 03/26/2013   Procedure: Attempted thrombectomy of left arm arteriovenous goretex graft.;  Surgeon: Angelia Mould,  MD;  [facility];  Service: Vascular;  Laterality: Left;   ULTRASOUND GUIDANCE FOR VASCULAR ACCESS Bilateral 09/24/2019   Procedure: Ultrasound Guidance For Vascular Access, Right Femoral Vein and Left Arm Graft;  Surgeon: Serafina Mitchell, MD;  Location[facility];  Service: Vascular;  Laterality: Bilateral;   VENOGRAM N/A 04/07/2013   Procedure: VENOGRAM;  Surgeon: Serafina Mitchell, MD;  Location: [facility] C[facility] LAB;  Service: Cardiovascular;  Laterality: N/A;    Prior to Admission medications   Medication Sig Start Date End Date Taking? Authorizing Provider  acetaminophen (TYLENOL) 500 MG tablet Take 1,000 mg by mouth every 6 (six) hours as needed for mild pain or moderate pain. Patient not taking: Reported on 06/27/2021    [provider]  allopurinol (ZYLOPRIM) 100 MG tablet Take 100 mg by mouth daily.    [provider]  Amino Acids-Protein Hydrolys (FEEDING SUPPLEMENT, PRO-STAT 64,) LIQD Take 30 mLs by mouth 3 (three) times daily with meals.    [provider]  amiodarone (PACERONE) 200 MG tablet Take 200 mg by mouth daily.    [provider]  aspirin EC 81 MG tablet Take 1 tablet (81 mg total) by mouth daily. 11/29/15   Rivet, Sindy Guadeloupe, MD  atorvastatin (LIPITOR) 40  MG tablet Take 40 mg by mouth at bedtime.    [provider]  docusate sodium (COLACE) 100 MG capsule Take 100 mg by mouth daily.    [provider]  levETIRAcetam (KEPPRA) 500 MG tablet Take 500 mg by mouth See admin instructions. Take 1 tablet (500mg ) by mouth twice daily every Tuesday, Thursday, Saturday and Sunday    [provider]  levETIRAcetam (KEPPRA) 500 MG tablet Take 500 mg by mouth See admin instructions. Take 1 tablet (500mg ) by mouth three times daily on Monday, Wednesday and Friday    [provider]  midodrine (PROAMATINE) 10 MG tablet Take 1 tablet (10 mg total) by mouth 4 (four) times daily. 07/20/21   Swayze, Ava, DO  mirtazapine (REMERON) 15 MG tablet Take 1 tablet (15 mg total) by mouth at bedtime. 07/20/21   Swayze, Ava, DO  Nutritional Supplements (FEEDING SUPPLEMENT, NEPRO CARB STEADY,) LIQD Take 237 mLs by mouth 2 (two) times daily between meals. Patient not taking: Reported on 06/27/2021 10/07/17   Jadene Pierini E, [name]  pantoprazole (PROTONIX) 40 MG tablet Take 1 tablet (40 mg total) by mouth daily. 07/21/21   Swayze, Ava, DO  polyethylene glycol (MIRALAX / GLYCOLAX) 17 g packet Take 17 g by mouth daily.    [provider]  senna-docusate (SENOKOT-S) 8.6-50 MG tablet Take 1 tablet by mouth at bedtime.    [provider]  sertraline (ZOLOFT) 25 MG tablet Take 25 mg by mouth at bedtime.    [provider]  sevelamer carbonate (RENVELA) 800 MG tablet Take 800 mg by mouth 3 (three) times daily with meals.    [provider]  traZODone (DESYREL) 50 MG tablet Take 0.5 tablets (25 mg total) by mouth at bedtime as needed for sleep. 07/20/21   Swayze, Ava, DO    Allergies Patient has no known allergies.  Family History  Problem Relation Age of Onset   Heart disease Father    Diabetes Sister    Diabetes Brother    Diabetes Son     Social History Social History   Tobacco Use   Smoking status: Former     Years: 7.00    Types: Cigarettes    Quit  date: 06/10/1973    Years since quitting: 48.1   Smokeless tobacco: Never   Tobacco comments:    Quit in 1974.  Vaping Use   Vaping Use: Never used  Substance Use Topics   Alcohol use: No    Alcohol/week: 0.0 standard drinks   Drug use: No    Review of Systems  Constitutional: No fever/chills Eyes: No visual changes. ENT: No sore throat. Cardiovascular: Denies chest pain. Respiratory: Denies shortness of breath. Gastrointestinal: No abdominal pain.  No nausea, no vomiting.  No diarrhea.  No constipation. Genitourinary: Negative for dysuria. Musculoskeletal: Positive for back pain.  BKA right lower extremity Skin: Negative for rash. Neurological: Negative for headaches, focal weakness or numbness.  ____________________________________________   PHYSICAL EXAM:  VITAL SIGNS: ED Triage Vitals  Enc Vitals Group     BP 08/04/21 0734 (!) 145/76     Pulse Rate 08/04/21 0734 73     Resp 08/04/21 0734 14     Temp 08/04/21 0734 98 F (36.7 C)     Temp Source 08/04/21 0734 Oral     SpO2 08/04/21 0734 100 %     Weight 08/04/21 0758 186 lb 8.2 oz (84.6 kg)     Height 08/04/21 0735 5\' 9"  (1.753 m)     Head Circumference --      Peak Flow --      Pain Score --      Pain Loc --      Pain Edu? --      Excl. in [place]? --     Constitutional: Alert and oriented. Well appearing and in no acute distress. Cardiovascular: Normal rate, regular rhythm. Grossly normal heart sounds.  Good peripheral circulation. Respiratory: Normal respiratory effort.  No retractions. Lungs CTAB. Gastrointestinal: Soft and nontender. No distention. No abdominal bruits. No CVA tenderness. Genitourinary: Deferred Musculoskeletal: No lower extremity tenderness nor edema.  No joint effusions. Neurologic:  Normal speech and language. No gross focal neurologic deficits are appreciated. No gait instability. Skin:  Skin is warm, dry and intact. No rash noted. Psychiatric:  Mood and affect are normal. Speech and behavior are normal.  ____________________________________________   LABS (all labs ordered are listed, but only abnormal results are displayed)  Labs Reviewed - No data to display ____________________________________________  EKG   ____________________________________________  RADIOLOGY I, Sable Feil, personally viewed and evaluated these images (plain radiographs) as part of my medical decision making, as well as reviewing the written report by the radiologist.  ED MD interpretation:    Official radiology report(s): No results found.  ____________________________________________   PROCEDURES  Procedure(s) performed (including Critical Care):  Procedures   ____________________________________________   INITIAL IMPRESSION / ASSESSMENT AND PLAN / ED COURSE  As part of my medical decision making, I reviewed the following data within the [place]         Patient complain low back pain status post may remove from bed to chair to have dialysis performed.  Patient denies radicular component to pain.  Patient denies bladder or bowel dysfunction.  Patient is [place] well 0.5 mg of Dilaudid and state he is hungry and ready for discharge.  Advised to follow-up PCP.      ____________________________________________   FINAL CLINICAL IMPRESSION(S) / ED DIAGNOSES  Final diagnoses:  Acute midline low back pain without sciatica     ED Discharge Orders     None        Note:  This document was prepared using Dragon  voice recognition software and may include unintentional dictation errors.    Sable Feil, [name] 08/04/21 [id number]    Harvest Dark, MD 08/04/21 [phone]

## 2021-08-04 NOTE — ED Triage Notes (Addendum)
Pt comes into the ED via EMS from dialysis, from [facility], before dialysis was started pt c/o lower back pain for the past couple of hours.Ernest Kitchenpt placed on 2L [place]  77HR 90%RA,  156/77

## 2021-08-04 NOTE — Discharge Instructions (Addendum)
Read and follow discharge care instruction.  Follow-up with scheduled dialysis appointment tomorrow.  Continue previous medications

## 2021-08-16 ENCOUNTER — Telehealth (INDEPENDENT_AMBULATORY_CARE_PROVIDER_SITE_OTHER): Payer: Self-pay

## 2021-08-16 NOTE — Telephone Encounter (Signed)
Yes she can remove staples.  The patient should have had an appointment about 2 weeks ago.  They should come in with ABIs to see me or GS

## 2021-08-16 NOTE — Telephone Encounter (Signed)
Ernest [name] wound care RN from [facility] called and left a VM on the nurses line wanting to know about a F/U appointment for the pt per his chart I do not see a FU scheduled. The wound care nurse would also like to know if we would like her to remove the staples, and she would like to make [place] aware that the pt has has new areas on his other foot. Please advise.

## 2021-08-16 NOTE — Telephone Encounter (Signed)
Ernest [name] said that she would have the person who is in charge of transportation call an schedule the pt.

## 2021-08-21 ENCOUNTER — Other Ambulatory Visit (INDEPENDENT_AMBULATORY_CARE_PROVIDER_SITE_OTHER): Payer: Self-pay | Admitting: Nurse Practitioner

## 2021-08-21 DIAGNOSIS — S88111A Complete traumatic amputation at level between knee and ankle, right lower leg, initial encounter: Secondary | ICD-10-CM

## 2021-08-21 DIAGNOSIS — I70261 Atherosclerosis of native arteries of extremities with gangrene, right leg: Secondary | ICD-10-CM

## 2021-08-23 NOTE — Progress Notes (Signed)
MRN : [id number]  Ernest [name] is a 75 y.o. (Jul 30, 1946) male who presents with chief complaint of check leg.  History of Present Illness:   The patient returns to the office for followup and review status post angiogram with intervention 06/28/2021.   Procedure: Percutaneous transluminal angioplasty right common femoral artery.  The patient notes no change in his left lower extremity symptoms. No worsening of his rest pain symptoms. Previous wounds are stable but not healed.  No new ulcers or wounds have occurred since the last visit.  There have been no significant changes to the patient's overall health care.  The patient denies amaurosis fugax or recent TIA symptoms. There are no recent neurological changes noted. The patient denies history of DVT, PE or superficial thrombophlebitis. The patient denies recent episodes of angina or shortness of breath.   ABI's Rt=AKA and Lt=1.17 (TBI=0.48) monophasic signal    No outpatient medications have been marked as taking for the 08/24/21 encounter (Appointment) with Delana Meyer, Dolores Lory, MD.    Past Medical History:  Diagnosis Date   Abnormal stress test    s/p cath November 2013 with modest disease involving the ostial left main, proximal LAD, proximal RCA - do not appear to be hemodynamically signficant; mild LV dysfunction   Anemia    low iron   Anxiety    Arthritis    Bacteremia    Chronic systolic CHF (congestive heart failure) (HCC)    COPD (chronic obstructive pulmonary disease) ([place])    Coronary artery disease    a. per cath report 2013, b. 09/13/2017-CABG X4 & Mitral valve repair   Depression    DVT (deep venous thrombosis) (HCC)    arm - right- finished blood thinner- not sure when it   Ejection fraction < 50%    Erectile dysfunction    penile implant   ESRD (end stage renal disease) ([place])    [facility] on a MWF schedule.      Gout    Hx: of   Hepatitis C    History of seizures    last one 2018   Hyperlipidemia     Hypertension    Mitral valve disease    s/p Mitral repair 09/13/17   Obesity    Retinopathy    Shortness of breath    Hx: of with exertion   Stroke [facility])    "several" left side weakness, speech affected   Type II or unspecified type diabetes mellitus without mention of complication, not stated as uncontrolled    adult onset    Past Surgical History:  Procedure Laterality Date   A/V FISTULAGRAM N/A 10/04/2017   Procedure: A/V FISTULAGRAM;  Surgeon: Elam Dutch, MD;  Location: [place] [facility];  Service: Cardiovascular;  Laterality: N/A;   A/V SHUNTOGRAM Left 07/12/2020   Procedure: A/V SHUNTOGRAM;  Surgeon: Serafina Mitchell, MD;  Location: [place] [facility];  Service: Cardiovascular;  Laterality: Left;   AMPUTATION Right 07/08/2021   Procedure: AMPUTATION ABOVE KNEE;  Surgeon: Elmore Guise, MD;  Location: [facility];  Service: Vascular;  Laterality: Right;   ANGIOPLASTY Left 09/24/2019   Procedure: Angioplasty Innominate Vien;  Surgeon: Serafina Mitchell, MD;  Location: [place];  Service: Vascular;  Laterality: Left;   AV FISTULA PLACEMENT Left 01/13/2013   Procedure: INSERTION OF ARTERIOVENOUS (AV) GORE-TEX GRAFT ARM;  Surgeon: Angelia Mould, MD;  Location: [place];  Service: Vascular;  Laterality: Left;   AV FISTULA PLACEMENT Left 05/05/2013  Procedure: INSERTION OF LEFT UPPER ARM  ARTERIOVENOUS GORTEX GRAFT;  Surgeon: Angelia Mould, MD;  Location: [place];  Service: Vascular;  Laterality: Left;   AV FISTULA PLACEMENT Left 11/26/2019   Procedure: INSERTION OF ARTERIOVENOUS (AV) GORE-TEX GRAFT, left  ARM;  Surgeon: Serafina Mitchell, MD;  Location: [place];  Service: Vascular;  Laterality: Left;   [place] REMOVAL Left 03/26/2013   Procedure: REMOVAL OF  NON INCORPORATED ARTERIOVENOUS GORETEX GRAFT ([place]) left arm * repair of  left brachial artery with vein patch angioplasty.;  Surgeon: Angelia Mould, MD;  Location: [place];  Service: Vascular;  Laterality: Left;    CARDIAC CATHETERIZATION  August 26, 2012   CATARACT EXTRACTION W/ INTRAOCULAR LENS  IMPLANT, BILATERAL     COLONOSCOPY     CORONARY ARTERY BYPASS GRAFT N/A 09/13/2017   Procedure: CORONARY ARTERY BYPASS GRAFTING (CABG) times four LIMA  to LAD, SVG sequentially to OM and RAMUS Intermediate and SVG to Acute Marginal;  Surgeon: Melrose Nakayama, MD;  Location: [place];  Service: Open Heart Surgery;  Laterality: N/A;   DIALYSIS/PERMA CATHETER INSERTION N/A 02/28/2021   Procedure: DIALYSIS/PERMA CATHETER EXCHANGE;  Surgeon: Katha Cabal, MD;  Location: [place] [facility];  Service: Cardiovascular;  Laterality: N/A;   EMBOLECTOMY Right 08/27/2013   Procedure: EMBOLECTOMY;  Surgeon: Mal Misty, MD;  Location: [place];  Service: Vascular;  Laterality: Right;  Thrombectomy of Radial and ulnar artery.   ENDOVASCULAR STENT INSERTION Left 09/24/2019   Procedure: Endovascular Stent Graft Insertion, Arm Graft;  Surgeon: Serafina Mitchell, MD;  Location: [facility];  Service: Vascular;  Laterality: Left;   EYE SURGERY     laser.  and surgery for DM   fistula     RUE and wrist   FISTULOGRAM Left 09/24/2019   Procedure: SHUNTOGRAM OF ARTERIOVENOUS FISTULA LEFT ARM,;  Surgeon: Serafina Mitchell, MD;  Location: [place];  Service: Vascular;  Laterality: Left;   INSERTION OF DIALYSIS CATHETER Right 01/01/2013   Procedure: INSERTION OF DIALYSIS CATHETER right  internal jugular;  Surgeon: Serafina Mitchell, MD;  Location: [place];  Service: Vascular;  Laterality: Right;   INSERTION OF DIALYSIS CATHETER N/A 03/26/2013   Procedure: INSERTION OF DIALYSIS CATHETER Left internal jugular vein;  Surgeon: Angelia Mould, MD;  Location: [place];  Service: Vascular;  Laterality: N/A;   INSERTION OF DIALYSIS CATHETER Right 11/26/2019   Procedure: INSERTION OF DIALYSIS CATHETER, right internal jugular;  Surgeon: Serafina Mitchell, MD;  Location: [place];  Service: Vascular;  Laterality: Right;   IR THORACENTESIS ASP PLEURAL  SPACE W/IMG GUIDE  09/30/2019   LEFT HEART CATH AND CORONARY ANGIOGRAPHY N/A 09/09/2017   Procedure: LEFT HEART CATH AND CORONARY ANGIOGRAPHY;  Surgeon: Troy Sine, MD;  Location: [place] [facility];  Service: Cardiovascular;  Laterality: N/A;   LIGATION OF ARTERIOVENOUS  FISTULA Right 12/30/2013   Procedure: REMOVAL OF SEGMENT OF GORTEX GRAFT AND FISTULA  AND REPAIR OF BRACHIAL ARTERY;  Surgeon: Mal Misty, MD;  Location: [place];  Service: Vascular;  Laterality: Right;   LOWER EXTREMITY ANGIOGRAPHY N/A 06/28/2021   Procedure: Lower Extremity Angiography;  Surgeon: Katha Cabal, MD;  Location: [place] [facility];  Service: Cardiovascular;  Laterality: N/A;   MITRAL VALVE REPAIR N/A 09/13/2017   Procedure: MITRAL VALVE REPAIR (MVR);  Surgeon: Melrose Nakayama, MD;  Location: [place];  Service: Open Heart Surgery;  Laterality: N/A;   MITRAL VALVE REPAIR (MV)/CORONARY ARTERY  BYPASS GRAFTING (CABG)  09/13/2017   PENILE PROSTHESIS IMPLANT     1997.. no card   PERIPHERAL VASCULAR BALLOON ANGIOPLASTY Left 10/04/2017   Procedure: PERIPHERAL VASCULAR BALLOON ANGIOPLASTY;  Surgeon: Elam Dutch, MD;  Location: [place] [facility];  Service: Cardiovascular;  Laterality: Left;  AVF   PERIPHERAL VASCULAR BALLOON ANGIOPLASTY Left 07/12/2020   Procedure: PERIPHERAL VASCULAR BALLOON ANGIOPLASTY;  Surgeon: Serafina Mitchell, MD;  Location: [place] [facility];  Service: Cardiovascular;  Laterality: Left;  ARM SHUNTOGRAM   REVISION OF ARTERIOVENOUS GORETEX GRAFT Left 09/24/2019   Procedure: REVISION OF ARTERIOVENOUS GORETEX GRAFT LEFT ARM;  Surgeon: Serafina Mitchell, MD;  Location: [facility];  Service: Vascular;  Laterality: Left;   TEE WITHOUT CARDIOVERSION N/A 06/11/2013   Procedure: TRANSESOPHAGEAL ECHOCARDIOGRAM (TEE);  Surgeon: Larey Dresser, MD;  Location: [facility];  Service: Cardiovascular;  Laterality: N/A;   TEE WITHOUT CARDIOVERSION N/A 09/13/2017   Procedure: TRANSESOPHAGEAL  ECHOCARDIOGRAM (TEE);  Surgeon: Melrose Nakayama, MD;  Location: [place];  Service: Open Heart Surgery;  Laterality: N/A;   THROMBECTOMY W/ EMBOLECTOMY Left 03/26/2013   Procedure: Attempted thrombectomy of left arm arteriovenous goretex graft.;  Surgeon: Angelia Mould, MD;  Location: [facility];  Service: Vascular;  Laterality: Left;   ULTRASOUND GUIDANCE FOR VASCULAR ACCESS Bilateral 09/24/2019   Procedure: Ultrasound Guidance For Vascular Access, Right Femoral Vein and Left Arm Graft;  Surgeon: Serafina Mitchell, MD;  Location: [facility] OR;  Service: Vascular;  Laterality: Bilateral;   VENOGRAM N/A 04/07/2013   Procedure: VENOGRAM;  Surgeon: Serafina Mitchell, MD;  Location: [facility] CATH LAB;  Service: Cardiovascular;  Laterality: N/A;    Social History Social History   Tobacco Use   Smoking status: Former    Years: 7.00    Types: Cigarettes    Quit date: 06/10/1973    Years since quitting: 48.2   Smokeless tobacco: Never   Tobacco comments:    Quit in 1974.  Vaping Use   Vaping Use: Never used  Substance Use Topics   Alcohol use: No    Alcohol/week: 0.0 standard drinks   Drug use: No    Family History Family History  Problem Relation Age of Onset   Heart disease Father    Diabetes Sister    Diabetes Brother    Diabetes Son     [name]   REVIEW OF SYSTEMS (Negative unless checked)  Constitutional: [] Weight loss  [] Fever  [] Chills Cardiac: [] Chest pain   [] Chest pressure   [] Palpitations   [] Shortness of breath when laying flat   [] Shortness of breath with exertion. Vascular:  [] Pain in legs with walking   [] Pain in legs at rest  [] History of DVT   [] Phlebitis   [] Swelling in legs   [] Varicose veins   [] Non-healing ulcers Pulmonary:   [] Uses home oxygen   [] Productive cough   [] Hemoptysis   [] Wheeze  [] COPD   [] Asthma Neurologic:  [] Dizziness   [] Seizures   [] History of stroke   [] History of TIA  [] Aphasia   [] Vissual changes   [] Weakness or numbness in arm    [] Weakness or numbness in leg Musculoskeletal:   [] Joint swelling   [] Joint pain   [] Low back pain Hematologic:  [] Easy bruising  [] Easy bleeding   [] Hypercoagulable state   [] Anemic Gastrointestinal:  [] Diarrhea   [] Vomiting  [] Gastroesophageal reflux/heartburn   [] Difficulty swallowing. Genitourinary:  [] Chronic kidney disease   [] Difficult urination  [] Frequent urination   [] Blood in urine Skin:  []   Rashes   [] Ulcers  Psychological:  [] History of anxiety   []  History of major depression.  Physical Examination  There were no vitals filed for this visit. There is no height or weight on file to calculate BMI. Gen: WD/WN, NAD Head: [place]/AT, No temporalis wasting.  Ear/Nose/Throat: Hearing grossly intact, nares w/o erythema or drainage Eyes: PER, EOMI, sclera nonicteric.  Neck: Supple, no masses.  No bruit or JVD.  Pulmonary:  Good air movement, no audible wheezing, no use of accessory muscles.  Cardiac: RRR, normal S1, S2, no Murmurs. Vascular: Patient has dry gangrene of the first and second toes left foot.  Right femoral tunneled catheter intact Vessel Right Left  Radial Palpable Palpable  PT AKA Not palpable  DP AKA Not palpable  Gastrointestinal: soft, non-distended. No guarding/no peritoneal signs.  Musculoskeletal: M/S 5/5 throughout.  No visible deformity.  Neurologic: CN 2-12 intact. Pain and light touch intact in extremities.  Symmetrical.  Speech is fluent. Motor exam as listed above. Psychiatric: Judgment intact, Mood & affect appropriate for pt's clinical situation. Dermatologic: No rashes or ulcers noted.  No changes consistent with cellulitis.   CBC Lab Results  Component Value Date   WBC 7.6 07/18/2021   HGB 9.0 (L) 07/18/2021   HCT 28.5 (L) 07/18/2021   MCV 86.9 07/18/2021   PLT 332 07/18/2021    BMET    Component Value Date/Time   NA 133 (L) 07/15/2021 1154   NA 140 12/11/2017 0000   K 4.5 07/15/2021 1154   CL 94 (L) 07/15/2021 1154   CO2 26 07/15/2021  1154   GLUCOSE 128 (H) 07/15/2021 1154   BUN 43 (H) 07/15/2021 1154   BUN 16 12/11/2017 0000   CREATININE 6.93 (H) 07/15/2021 1154   CALCIUM 8.5 (L) 07/15/2021 1154   CALCIUM 8.4 (L) 10/05/2017 0615   GFRNONAA 8 (L) 07/15/2021 1154   GFRAA 21 (L) 12/04/2019 2027   CrCl cannot be calculated (Patient's most recent lab result is older than the maximum 21 days allowed.).  COAG Lab Results  Component Value Date   INR 1.3 (H) 07/08/2021   INR 1.4 (H) 06/30/2021   INR 1.4 (H) 06/27/2021   PROTIME 25.4 (A) 12/27/2017   PROTIME 33.5 (A) 12/24/2017   PROTIME 21.5 (A) 12/11/2017    Radiology No results found.   Assessment/Plan 1. PAD (peripheral artery disease) (HCC)  Recommend:  The patient has evidence of severe atherosclerotic changes of both lower extremities associated with ulceration and tissue loss of the left foot.  This represents a limb threatening ischemia and places the patient at the risk for left limb loss.  Patient has already lost his right lower extremity at the above-knee level.  Any subsequent amputation on the left would have a profound limitation on his mobility as far as standing and transferring and therefore angiography and intervention has even greater import.  Patient should undergo angiography of the left lower extremities with the hope for intervention for limb salvage.  The risks and benefits as well as the alternative therapies was discussed in detail with the patient.  All questions were answered.  Patient agrees to proceed with left lower extremity angiography.  (At the time of angiography we will also perform venography of the upper extremities to see if there is any possibility for an upper extremity access given his femoral tunnel catheter)  The patient will follow up with me in the office after the procedure.    2. ESRD (end stage renal disease) ([place])  Currently patient is maintained with a femoral catheter.  As noted above at the time of intervention if  all goes well we will move forward with venography of the upper extremities to see if there is any availability for placement of a hero graft.  3. Atherosclerosis of native coronary artery of native heart without angina pectoris Continue cardiac and antihypertensive medications as already ordered and reviewed, no changes at this time.  Continue statin as ordered and reviewed, no changes at this time  Nitrates PRN for chest pain   4. Paroxysmal atrial flutter (HCC) Continue antiarrhythmia medications as already ordered, these medications have been reviewed and there are no changes at this time.  Continue anticoagulation as ordered by Cardiology Service   5. Type 2 diabetes mellitus with hyperglycemia, without long-term current use of insulin (HCC) Continue hypoglycemic medications as already ordered, these medications have been reviewed and there are no changes at this time.  Hgb A1C to be monitored as already arranged by primary service     Hortencia Pilar, MD  08/23/2021 10:15 AM

## 2021-08-24 ENCOUNTER — Ambulatory Visit (INDEPENDENT_AMBULATORY_CARE_PROVIDER_SITE_OTHER): Payer: Medicare (Managed Care) | Admitting: Vascular Surgery

## 2021-08-24 ENCOUNTER — Encounter (INDEPENDENT_AMBULATORY_CARE_PROVIDER_SITE_OTHER): Payer: Self-pay | Admitting: Vascular Surgery

## 2021-08-24 ENCOUNTER — Other Ambulatory Visit: Payer: Self-pay

## 2021-08-24 ENCOUNTER — Ambulatory Visit (INDEPENDENT_AMBULATORY_CARE_PROVIDER_SITE_OTHER): Payer: Medicare (Managed Care)

## 2021-08-24 ENCOUNTER — Telehealth (INDEPENDENT_AMBULATORY_CARE_PROVIDER_SITE_OTHER): Payer: Self-pay

## 2021-08-24 VITALS — BP 176/94 | HR 69 | Ht 69.0 in | Wt 200.0 lb

## 2021-08-24 DIAGNOSIS — I4892 Unspecified atrial flutter: Secondary | ICD-10-CM | POA: Diagnosis not present

## 2021-08-24 DIAGNOSIS — S88111A Complete traumatic amputation at level between knee and ankle, right lower leg, initial encounter: Secondary | ICD-10-CM | POA: Diagnosis not present

## 2021-08-24 DIAGNOSIS — I70261 Atherosclerosis of native arteries of extremities with gangrene, right leg: Secondary | ICD-10-CM | POA: Diagnosis not present

## 2021-08-24 DIAGNOSIS — N186 End stage renal disease: Secondary | ICD-10-CM | POA: Diagnosis not present

## 2021-08-24 DIAGNOSIS — I739 Peripheral vascular disease, unspecified: Secondary | ICD-10-CM

## 2021-08-24 DIAGNOSIS — E1165 Type 2 diabetes mellitus with hyperglycemia: Secondary | ICD-10-CM

## 2021-08-24 DIAGNOSIS — I2510 Atherosclerotic heart disease of native coronary artery without angina pectoris: Secondary | ICD-10-CM | POA: Diagnosis not present

## 2021-08-24 NOTE — Telephone Encounter (Signed)
I contacted [facility] regarding the patient being scheduled for a left leg angio and bilateral venograms. I spoke with Decarla and the patient is scheduled on 09/05/21 with a 12:00 pm arrival time to the [facility]. Pre-procedure instructions will be faxed to attention [place] at [facility].

## 2021-08-28 DIAGNOSIS — M869 Osteomyelitis, unspecified: Secondary | ICD-10-CM

## 2021-08-30 ENCOUNTER — Encounter (INDEPENDENT_AMBULATORY_CARE_PROVIDER_SITE_OTHER): Payer: Self-pay

## 2021-09-04 ENCOUNTER — Other Ambulatory Visit (INDEPENDENT_AMBULATORY_CARE_PROVIDER_SITE_OTHER): Payer: Self-pay | Admitting: Nurse Practitioner

## 2021-09-04 DIAGNOSIS — I739 Peripheral vascular disease, unspecified: Secondary | ICD-10-CM

## 2021-09-05 ENCOUNTER — Encounter
Admission: RE | Disposition: A | Discharge status: Skilled Nursing Facility | Payer: Self-pay | Source: Home / Self Care | Attending: Vascular Surgery

## 2021-09-05 ENCOUNTER — Ambulatory Visit
Admission: RE | Admit: 2021-09-05 | Discharge: 2021-09-05 | Disposition: A | Discharge status: Skilled Nursing Facility | Payer: Medicare (Managed Care) | Attending: Vascular Surgery | Admitting: Vascular Surgery

## 2021-09-05 ENCOUNTER — Other Ambulatory Visit: Payer: Self-pay

## 2021-09-05 ENCOUNTER — Encounter: Payer: Self-pay | Admitting: Vascular Surgery

## 2021-09-05 DIAGNOSIS — Z539 Procedure and treatment not carried out, unspecified reason: Secondary | ICD-10-CM | POA: Diagnosis not present

## 2021-09-05 DIAGNOSIS — L97909 Non-pressure chronic ulcer of unspecified part of unspecified lower leg with unspecified severity: Secondary | ICD-10-CM

## 2021-09-05 DIAGNOSIS — I70299 Other atherosclerosis of native arteries of extremities, unspecified extremity: Secondary | ICD-10-CM

## 2021-09-05 DIAGNOSIS — I739 Peripheral vascular disease, unspecified: Secondary | ICD-10-CM

## 2021-09-05 LAB — POTASSIUM (ARMC VASCULAR LAB ONLY): Potassium (ARMC vascular lab): 4.9 (ref 3.5–5.1)

## 2021-09-05 LAB — GLUCOSE, CAPILLARY: Glucose-Capillary: 163 mg/dL — ABNORMAL HIGH (ref 70–99)

## 2021-09-05 SURGERY — LOWER EXTREMITY ANGIOGRAPHY
Anesthesia: Moderate Sedation | Site: Leg Lower | Laterality: Left

## 2021-09-05 MED ORDER — MIDAZOLAM HCL 2 MG/ML PO SYRP: 8.0000 mg | ORAL_SOLUTION | Freq: Once | ORAL | Status: DC | PRN

## 2021-09-05 MED ORDER — DIPHENHYDRAMINE HCL 50 MG/ML IJ SOLN: 50.0000 mg | Freq: Once | INTRAMUSCULAR | Status: DC | PRN

## 2021-09-05 MED ORDER — SODIUM CHLORIDE 0.9 % IV SOLN: INTRAVENOUS | Status: DC

## 2021-09-05 MED ORDER — ONDANSETRON HCL 4 MG/2ML IJ SOLN: 4.0000 mg | Freq: Four times a day (QID) | INTRAMUSCULAR | Status: DC | PRN

## 2021-09-05 MED ORDER — FAMOTIDINE 20 MG PO TABS: 40.0000 mg | ORAL_TABLET | Freq: Once | ORAL | Status: DC | PRN

## 2021-09-05 MED ORDER — METHYLPREDNISOLONE SODIUM SUCC 125 MG IJ SOLR: 125.0000 mg | Freq: Once | INTRAMUSCULAR | Status: DC | PRN

## 2021-09-05 MED ORDER — CEFAZOLIN SODIUM-DEXTROSE 1-4 GM/50ML-% IV SOLN: 1.0000 g | Freq: Once | INTRAVENOUS | Status: DC

## 2021-09-05 MED ORDER — HYDROMORPHONE HCL 1 MG/ML IJ SOLN: 1.0000 mg | Freq: Once | INTRAMUSCULAR | Status: DC | PRN

## 2021-09-05 NOTE — Progress Notes (Signed)
Case cancelled by MD. Kathaleen Grinder phoned & transportation arranged to get pt. Back to rm. 238. Attempted 3x to call [facility] without any answer. Receptionist Cayden made aware pt. To tx back to [facility] .

## 2021-09-25 ENCOUNTER — Emergency Department: Payer: Medicare (Managed Care)

## 2021-09-25 ENCOUNTER — Inpatient Hospital Stay: Payer: Medicare (Managed Care)

## 2021-09-25 ENCOUNTER — Inpatient Hospital Stay
Admission: EM | Admit: 2021-09-25 | Discharge: 2021-09-29 | DRG: 061 | Disposition: A | Discharge status: Skilled Nursing Facility | Payer: Medicare (Managed Care) | Source: Ambulatory Visit | Attending: Internal Medicine | Admitting: Internal Medicine

## 2021-09-25 ENCOUNTER — Other Ambulatory Visit: Payer: Self-pay

## 2021-09-25 DIAGNOSIS — Z8249 Family history of ischemic heart disease and other diseases of the circulatory system: Secondary | ICD-10-CM

## 2021-09-25 DIAGNOSIS — I69322 Dysarthria following cerebral infarction: Secondary | ICD-10-CM

## 2021-09-25 DIAGNOSIS — R4781 Slurred speech: Secondary | ICD-10-CM

## 2021-09-25 DIAGNOSIS — E8770 Fluid overload, unspecified: Secondary | ICD-10-CM

## 2021-09-25 DIAGNOSIS — G4733 Obstructive sleep apnea (adult) (pediatric): Secondary | ICD-10-CM | POA: Diagnosis present

## 2021-09-25 DIAGNOSIS — Z7982 Long term (current) use of aspirin: Secondary | ICD-10-CM

## 2021-09-25 DIAGNOSIS — Z833 Family history of diabetes mellitus: Secondary | ICD-10-CM

## 2021-09-25 DIAGNOSIS — M109 Gout, unspecified: Secondary | ICD-10-CM | POA: Diagnosis present

## 2021-09-25 DIAGNOSIS — N186 End stage renal disease: Secondary | ICD-10-CM | POA: Diagnosis present

## 2021-09-25 DIAGNOSIS — Z960 Presence of urogenital implants: Secondary | ICD-10-CM | POA: Diagnosis present

## 2021-09-25 DIAGNOSIS — L8990 Pressure ulcer of unspecified site, unspecified stage: Secondary | ICD-10-CM | POA: Diagnosis not present

## 2021-09-25 DIAGNOSIS — G459 Transient cerebral ischemic attack, unspecified: Secondary | ICD-10-CM | POA: Diagnosis present

## 2021-09-25 DIAGNOSIS — Z86718 Personal history of other venous thrombosis and embolism: Secondary | ICD-10-CM

## 2021-09-25 DIAGNOSIS — G40909 Epilepsy, unspecified, not intractable, without status epilepticus: Secondary | ICD-10-CM | POA: Diagnosis present

## 2021-09-25 DIAGNOSIS — F32A Depression, unspecified: Secondary | ICD-10-CM | POA: Diagnosis present

## 2021-09-25 DIAGNOSIS — I2510 Atherosclerotic heart disease of native coronary artery without angina pectoris: Secondary | ICD-10-CM | POA: Diagnosis present

## 2021-09-25 DIAGNOSIS — E669 Obesity, unspecified: Secondary | ICD-10-CM | POA: Diagnosis present

## 2021-09-25 DIAGNOSIS — F419 Anxiety disorder, unspecified: Secondary | ICD-10-CM | POA: Diagnosis present

## 2021-09-25 DIAGNOSIS — R410 Disorientation, unspecified: Secondary | ICD-10-CM

## 2021-09-25 DIAGNOSIS — I5043 Acute on chronic combined systolic (congestive) and diastolic (congestive) heart failure: Secondary | ICD-10-CM | POA: Diagnosis present

## 2021-09-25 DIAGNOSIS — E878 Other disorders of electrolyte and fluid balance, not elsewhere classified: Secondary | ICD-10-CM | POA: Diagnosis present

## 2021-09-25 DIAGNOSIS — J449 Chronic obstructive pulmonary disease, unspecified: Secondary | ICD-10-CM | POA: Diagnosis present

## 2021-09-25 DIAGNOSIS — E875 Hyperkalemia: Secondary | ICD-10-CM | POA: Diagnosis present

## 2021-09-25 DIAGNOSIS — Z79899 Other long term (current) drug therapy: Secondary | ICD-10-CM

## 2021-09-25 DIAGNOSIS — I132 Hypertensive heart and chronic kidney disease with heart failure and with stage 5 chronic kidney disease, or end stage renal disease: Secondary | ICD-10-CM | POA: Diagnosis present

## 2021-09-25 DIAGNOSIS — Z66 Do not resuscitate: Secondary | ICD-10-CM | POA: Diagnosis present

## 2021-09-25 DIAGNOSIS — Z87891 Personal history of nicotine dependence: Secondary | ICD-10-CM

## 2021-09-25 DIAGNOSIS — G9340 Encephalopathy, unspecified: Secondary | ICD-10-CM | POA: Diagnosis present

## 2021-09-25 DIAGNOSIS — I480 Paroxysmal atrial fibrillation: Secondary | ICD-10-CM | POA: Diagnosis present

## 2021-09-25 DIAGNOSIS — Z20822 Contact with and (suspected) exposure to covid-19: Secondary | ICD-10-CM | POA: Diagnosis present

## 2021-09-25 DIAGNOSIS — E1122 Type 2 diabetes mellitus with diabetic chronic kidney disease: Secondary | ICD-10-CM | POA: Diagnosis present

## 2021-09-25 DIAGNOSIS — B1920 Unspecified viral hepatitis C without hepatic coma: Secondary | ICD-10-CM | POA: Diagnosis present

## 2021-09-25 DIAGNOSIS — Z515 Encounter for palliative care: Secondary | ICD-10-CM | POA: Diagnosis not present

## 2021-09-25 DIAGNOSIS — E785 Hyperlipidemia, unspecified: Secondary | ICD-10-CM | POA: Diagnosis present

## 2021-09-25 DIAGNOSIS — M1990 Unspecified osteoarthritis, unspecified site: Secondary | ICD-10-CM | POA: Diagnosis present

## 2021-09-25 DIAGNOSIS — Z951 Presence of aortocoronary bypass graft: Secondary | ICD-10-CM

## 2021-09-25 DIAGNOSIS — E11319 Type 2 diabetes mellitus with unspecified diabetic retinopathy without macular edema: Secondary | ICD-10-CM | POA: Diagnosis present

## 2021-09-25 DIAGNOSIS — Z89611 Acquired absence of right leg above knee: Secondary | ICD-10-CM

## 2021-09-25 DIAGNOSIS — N2581 Secondary hyperparathyroidism of renal origin: Secondary | ICD-10-CM | POA: Diagnosis present

## 2021-09-25 DIAGNOSIS — D631 Anemia in chronic kidney disease: Secondary | ICD-10-CM | POA: Diagnosis present

## 2021-09-25 DIAGNOSIS — E871 Hypo-osmolality and hyponatremia: Secondary | ICD-10-CM | POA: Diagnosis present

## 2021-09-25 DIAGNOSIS — J9611 Chronic respiratory failure with hypoxia: Secondary | ICD-10-CM | POA: Diagnosis present

## 2021-09-25 DIAGNOSIS — Z992 Dependence on renal dialysis: Secondary | ICD-10-CM

## 2021-09-25 DIAGNOSIS — Z7189 Other specified counseling: Secondary | ICD-10-CM | POA: Diagnosis not present

## 2021-09-25 LAB — COMPREHENSIVE METABOLIC PANEL
ALT: 49 U/L — ABNORMAL HIGH (ref 0–44)
AST: 50 U/L — ABNORMAL HIGH (ref 15–41)
Albumin: 3.2 g/dL — ABNORMAL LOW (ref 3.5–5.0)
Alkaline Phosphatase: 84 U/L (ref 38–126)
Anion gap: 9 (ref 5–15)
BUN: 38 mg/dL — ABNORMAL HIGH (ref 8–23)
CO2: 29 mmol/L (ref 22–32)
Calcium: 8.4 mg/dL — ABNORMAL LOW (ref 8.9–10.3)
Chloride: 96 mmol/L — ABNORMAL LOW (ref 98–111)
Creatinine, Ser: 4.4 mg/dL — ABNORMAL HIGH (ref 0.61–1.24)
GFR, Estimated: 13 mL/min — ABNORMAL LOW (ref >60)
Glucose, Bld: 136 mg/dL — ABNORMAL HIGH (ref 70–99)
Potassium: 4.3 mmol/L (ref 3.5–5.1)
Sodium: 134 mmol/L — ABNORMAL LOW (ref 135–145)
Total Bilirubin: 0.7 mg/dL (ref 0.3–1.2)
Total Protein: 7 g/dL (ref 6.5–8.1)

## 2021-09-25 LAB — ETHANOL: Alcohol, Ethyl (B): <10 mg/dL (ref <10)

## 2021-09-25 LAB — DIFFERENTIAL
Abs Immature Granulocytes: 0.01 10*3/uL (ref 0.00–0.07)
Basophils Absolute: 0 10*3/uL (ref 0.0–0.1)
Basophils Relative: 1 %
Eosinophils Absolute: 0.3 10*3/uL (ref 0.0–0.5)
Eosinophils Relative: 3 %
Immature Granulocytes: 0 %
Lymphocytes Relative: 20 %
Lymphs Abs: 1.6 10*3/uL (ref 0.7–4.0)
Monocytes Absolute: 0.8 10*3/uL (ref 0.1–1.0)
Monocytes Relative: 10 %
Neutro Abs: 5.2 10*3/uL (ref 1.7–7.7)
Neutrophils Relative %: 66 %

## 2021-09-25 LAB — BLOOD GAS, VENOUS
Acid-Base Excess: 9.9 mmol/L — ABNORMAL HIGH (ref 0.0–2.0)
Acid-Base Excess: 10.2 mmol/L — ABNORMAL HIGH (ref 0.0–2.0)
Bicarbonate: 34.9 mmol/L — ABNORMAL HIGH (ref 20.0–28.0)
Bicarbonate: 36 mmol/L — ABNORMAL HIGH (ref 20.0–28.0)
O2 Saturation: 84.7 %
O2 Saturation: 87.7 %
Patient temperature: 37
Patient temperature: 37
pCO2, Ven: 48 mmHg (ref 44.0–60.0)
pCO2, Ven: 53 mmHg (ref 44.0–60.0)
pH, Ven: 7.47 — ABNORMAL HIGH (ref 7.250–7.430)
pH, Ven: 7.44 — ABNORMAL HIGH (ref 7.250–7.430)
pO2, Ven: 46 mmHg — ABNORMAL HIGH (ref 32.0–45.0)
pO2, Ven: 52 mmHg — ABNORMAL HIGH (ref 32.0–45.0)

## 2021-09-25 LAB — BRAIN NATRIURETIC PEPTIDE: B Natriuretic Peptide: 1494.1 pg/mL — ABNORMAL HIGH (ref 0.0–100.0)

## 2021-09-25 LAB — CBC
HCT: 34.6 % — ABNORMAL LOW (ref 39.0–52.0)
Hemoglobin: 11.5 g/dL — ABNORMAL LOW (ref 13.0–17.0)
MCH: 28.1 pg (ref 26.0–34.0)
MCHC: 33.2 g/dL (ref 30.0–36.0)
MCV: 84.6 fL (ref 80.0–100.0)
Platelets: 186 10*3/uL (ref 150–400)
RBC: 4.09 MIL/uL — ABNORMAL LOW (ref 4.22–5.81)
RDW: 16.3 % — ABNORMAL HIGH (ref 11.5–15.5)
WBC: 7.9 10*3/uL (ref 4.0–10.5)
nRBC: 0 % (ref 0.0–0.2)

## 2021-09-25 LAB — PROTIME-INR
INR: 1.1 (ref 0.8–1.2)
Prothrombin Time: 14.4 seconds (ref 11.4–15.2)

## 2021-09-25 LAB — TROPONIN I (HIGH SENSITIVITY): Troponin I (High Sensitivity): 28 ng/L — ABNORMAL HIGH (ref <18)

## 2021-09-25 LAB — CBG MONITORING, ED
Glucose-Capillary: 132 mg/dL — ABNORMAL HIGH (ref 70–99)
Glucose-Capillary: 116 mg/dL — ABNORMAL HIGH (ref 70–99)

## 2021-09-25 LAB — PROCALCITONIN: Procalcitonin: 0.44 ng/mL

## 2021-09-25 LAB — LACTIC ACID, PLASMA: Lactic Acid, Venous: 1 mmol/L (ref 0.5–1.9)

## 2021-09-25 LAB — APTT: aPTT: 45 seconds — ABNORMAL HIGH (ref 24–36)

## 2021-09-25 MED ORDER — LEVETIRACETAM 500 MG PO TABS: 500.0000 mg | ORAL_TABLET | Freq: Two times a day (BID) | ORAL | Status: DC

## 2021-09-25 MED ORDER — LEVETIRACETAM 500 MG PO TABS
500.0000 mg | ORAL_TABLET | ORAL | Status: DC
  Administered 2021-09-27: 13:00:00 500 mg via ORAL
  Filled 2021-09-25: qty 1

## 2021-09-25 MED ORDER — MIDODRINE HCL 5 MG PO TABS: 10.0000 mg | ORAL_TABLET | Freq: Four times a day (QID) | ORAL | Status: DC

## 2021-09-25 MED ORDER — FUROSEMIDE 10 MG/ML IJ SOLN
60.0000 mg | Freq: Two times a day (BID) | INTRAMUSCULAR | Status: DC
  Administered 2021-09-25 – 2021-09-27 (×4): 60 mg via INTRAVENOUS
  Filled 2021-09-25 (×2): qty 6
  Filled 2021-09-25: qty 8
  Filled 2021-09-25: qty 6

## 2021-09-25 MED ORDER — PRO-STAT 64 PO LIQD: 30.0000 mL | Freq: Three times a day (TID) | ORAL | Status: DC

## 2021-09-25 MED ORDER — SEVELAMER CARBONATE 800 MG PO TABS
800.0000 mg | ORAL_TABLET | Freq: Three times a day (TID) | ORAL | Status: DC
  Administered 2021-09-26 – 2021-09-29 (×9): 800 mg via ORAL
  Filled 2021-09-25 (×10): qty 1

## 2021-09-25 MED ORDER — POLYETHYLENE GLYCOL 3350 17 G PO PACK
17.0000 g | PACK | Freq: Every day | ORAL | Status: DC
  Administered 2021-09-26 – 2021-09-28 (×3): 17 g via ORAL
  Filled 2021-09-25 (×4): qty 1

## 2021-09-25 MED ORDER — LEVETIRACETAM 500 MG PO TABS: 500.0000 mg | ORAL_TABLET | ORAL | Status: DC

## 2021-09-25 MED ORDER — ALLOPURINOL 100 MG PO TABS: 100.0000 mg | ORAL_TABLET | Freq: Every day | ORAL | Status: DC

## 2021-09-25 MED ORDER — THIAMINE HCL 100 MG/ML IJ SOLN
500.0000 mg | Freq: Three times a day (TID) | INTRAVENOUS | Status: AC
  Administered 2021-09-26 – 2021-09-28 (×9): 500 mg via INTRAVENOUS
  Filled 2021-09-25 (×9): qty 5

## 2021-09-25 MED ORDER — STROKE: EARLY STAGES OF RECOVERY BOOK: Freq: Once | Status: DC

## 2021-09-25 MED ORDER — ATORVASTATIN CALCIUM 20 MG PO TABS
40.0000 mg | ORAL_TABLET | Freq: Every day | ORAL | Status: DC
  Administered 2021-09-26 – 2021-09-28 (×3): 40 mg via ORAL
  Filled 2021-09-25 (×4): qty 2

## 2021-09-25 MED ORDER — SENNOSIDES-DOCUSATE SODIUM 8.6-50 MG PO TABS
1.0000 | ORAL_TABLET | Freq: Every day | ORAL | Status: DC
Start: 2021-09-25 — End: 2021-09-29
  Administered 2021-09-27 – 2021-09-28 (×2): 1 via ORAL
  Filled 2021-09-25 (×2): qty 1

## 2021-09-25 MED ORDER — SODIUM CHLORIDE 0.9% FLUSH
3.0000 mL | Freq: Once | INTRAVENOUS | Status: DC
Start: 2021-09-25 — End: 2021-09-25

## 2021-09-25 MED ORDER — NEPRO/CARBSTEADY PO LIQD
237.0000 mL | Freq: Two times a day (BID) | ORAL | Status: DC
  Administered 2021-09-26 – 2021-09-28 (×5): 237 mL via ORAL

## 2021-09-25 MED ORDER — ASPIRIN EC 81 MG PO TBEC
81.0000 mg | DELAYED_RELEASE_TABLET | Freq: Every day | ORAL | Status: DC
  Administered 2021-09-26 – 2021-09-29 (×4): 81 mg via ORAL
  Filled 2021-09-25 (×4): qty 1

## 2021-09-25 MED ORDER — PANTOPRAZOLE SODIUM 40 MG PO TBEC
40.0000 mg | DELAYED_RELEASE_TABLET | Freq: Every day | ORAL | Status: DC
  Administered 2021-09-26 – 2021-09-29 (×4): 40 mg via ORAL
  Filled 2021-09-25 (×4): qty 1

## 2021-09-25 MED ORDER — SERTRALINE HCL 50 MG PO TABS
25.0000 mg | ORAL_TABLET | Freq: Every day | ORAL | Status: DC
  Administered 2021-09-26 – 2021-09-28 (×3): 25 mg via ORAL
  Filled 2021-09-25 (×3): qty 1

## 2021-09-25 MED ORDER — AMIODARONE HCL 200 MG PO TABS: 200.0000 mg | ORAL_TABLET | Freq: Every day | ORAL | Status: DC

## 2021-09-25 MED ORDER — MIRTAZAPINE 15 MG PO TABS
15.0000 mg | ORAL_TABLET | Freq: Every day | ORAL | Status: DC
  Administered 2021-09-26 – 2021-09-28 (×3): 15 mg via ORAL
  Filled 2021-09-25 (×3): qty 1

## 2021-09-25 NOTE — Progress Notes (Addendum)
[facility] - No Economist Note  Received message from nursing staff: Patient with heart rate brady down at 30's. Opens eyes to painful stimulation and states name but falls back asleep. CBG 116. BP 99/56. HR now 40-60's.  I reviewed patient's chart. [name] is a 6 yom with COPD, CAD, ESRD on HD MWF, s/p right AKA, CVA with residual dysarthria who presents from SNF for chief concern of altered mental status.   Chart Review:  Labs: HS Trop during admission was 28 which is lower than values two months ago  06/27/21 Complete Echo - read as left ventricular ejection fraction by estimation is 50-55%. The left ventricular internal cavity size is normal. Left ventricular diastolic parameters are consistent with Grade II diastolic dysfunction.   Vitals documented in chart: RR is 16, HR 65, BP 173/90, MAP 115, spO2 110%  Assessment/Plan  Patient is being worked up for TIA, rule out evolving CVA with worsening dysarthria and AMS with confusion  - Neurology was consulted  # Bradycardia/Symptomatic bradycardia - Query sick sinus syndrome - Stat EKG, UDS, VBG, procalcitonin, lactic acid  - If Procal and/or lactic acid is elevated, we will obtain blood cultures x2 - If changes to EKG, we will obtain repeat HS Troponin - Hold midodrine, amiodarone, mirtazepine  - Asked RN staff to give IV furosemide and obtain UDS if able - AM to consult cardiology for evaluation of pace maker - Message in-house provider to evaluate the patient in person  # Loculated peripheral left pleural effusion on CXR- check procalcitonine   Dr. Tobie Poet

## 2021-09-25 NOTE — ED Provider Notes (Signed)
[facility] Emergency Department Provider Note  ____________________________________________   None    (approximate)  I have reviewed the triage vital signs and the nursing notes.   HISTORY  Chief Complaint Code Stroke    HPI Ernest [name] is a 75 y.o. male with past medical history as below including COPD, CHF, and stage renal disease, history of several strokes, here with reported altered mental status.  Patient arrives from dialysis.  He reportedly had sat for an essentially a full session, when he was noted to be more reportedly confused than usual.  His speech was more slurred than usual as well according to the dialysis nurses.  He was subsequent brought in and activated as a code stroke.  Patient does exhibit confusion and slurred speech on arrival.  His skilled nursing facility was called who states that this is actually fairly chronic.  It is unclear whether his current speech is more abnormal.  Unclear when he was last normal, though he reportedly was at baseline this morning.  Patient is essentially unable to provide much history.  Denies any pain.  Level 5 caveat invoked as remainder of history, ROS, and physical exam limited due to patient's aphasia/confusion.     Past Medical History:  Diagnosis Date   Abnormal stress test    s/p cath November 2013 with modest disease involving the ostial left main, proximal LAD, proximal RCA - do not appear to be hemodynamically signficant; mild LV dysfunction   Anemia    low iron   Anxiety    Arthritis    Bacteremia    Chronic systolic CHF (congestive heart failure) (HCC)    COPD (chronic obstructive pulmonary disease) ([place])    Coronary artery disease    a. per cath report 2013, b. 09/13/2017-CABG X4 & Mitral valve repair   Depression    DVT (deep venous thrombosis) (HCC)    arm - right- finished blood thinner- not sure when it   Ejection fraction < 50%    Erectile dysfunction    penile implant    ESRD (end stage renal disease) ([place])    [facility] on a MWF schedule.      Gout    Hx: of   Hepatitis C    History of seizures    last one 2018   Hyperlipidemia    Hypertension    Mitral valve disease    s/p Mitral repair 09/13/17   Obesity    Retinopathy    Shortness of breath    Hx: of with exertion   Stroke [facility])    "several" left side weakness, speech affected   Type II or unspecified type diabetes mellitus without mention of complication, not stated as uncontrolled    adult onset    Patient Active Problem List   Diagnosis Date Noted   Osteomyelitis of right foot ([place])    Acquired absence of right leg above knee ([place]) 07/21/2021   Pressure injury of skin 07/19/2021   Pressure ulcer 07/16/2021   Hypotension    Wet gangrene ([place]) 07/10/2021   S/P AKA (above knee amputation), right ([place]) 07/10/2021   PAD (peripheral artery disease) ([place]) 07/08/2021   Sepsis ([place]) 06/27/2021   Bacterial infection, unspecified 03/31/2021   Contact with and (suspected) exposure to covid-19 03/13/2021   COVID-19 03/07/2021   Disorder of phosphorus metabolism, unspecified 02/20/2021   Hypercalcemia 12/08/2020   Atherosclerotic heart disease of native coronary artery without angina pectoris 11/18/2020   Diarrhea, unspecified 11/18/2020  Fever, unspecified 11/18/2020   Other fluid overload 11/18/2020   Pain, unspecified 11/18/2020   Pleural effusion on left 09/30/2019   Acute respiratory failure with hypoxia (HCC) 09/30/2019   Pleural effusion 09/30/2019   Chest pain 12/02/2018   Vascular dementia ([place]) 10/10/2017   Paroxysmal atrial flutter ([place]) 10/10/2017   Altered mental status, unspecified 10/09/2017   Other malaise 10/09/2017   S/P CABG x 4 10/04/2017   Cerebral embolism with cerebral infarction 09/20/2017   NSVT (nonsustained ventricular tachycardia)    Dyspnea 09/08/2017   Non-ST elevation MI (NSTEMI) ([place])    Acute on chronic combined systolic and diastolic CHF (congestive  heart failure) ([place])    End-stage renal disease on hemodialysis ([place])    Left leg weakness    TIA (transient ischemic attack) 10/27/2016   Dependence on renal dialysis ([place]) 08/09/2015   History of CVA with residual deficit 08/07/2015   Seizure, late effect of stroke ([place]) 08/07/2015   Diabetic peripheral neuropathy ([place]) 08/07/2015   Unspecified protein-calorie malnutrition ([place]) 05/27/2015   Seizure ([place]) 04/29/2015   Coagulation defect, unspecified ([place]) 07/16/2014   Type 2 diabetes mellitus with diabetic peripheral angiopathy without gangrene ([place]) 07/01/2014   Pruritus, unspecified 05/26/2014   Elevated prostate specific antigen (PSA) 08/03/2013   Encounter for immunization 07/10/2013   Obstructive sleep apnea 06/16/2013   Hyperlipidemia associated with type 2 diabetes mellitus ([place])    ESRD (end stage renal disease) ([place])    Erectile dysfunction associated with type 2 diabetes mellitus ([place])    Hypertension due to end stage renal disease caused by type 2 diabetes mellitus, on dialysis ([place])    COPD (chronic obstructive pulmonary disease) ([place])    Chronic combined systolic and diastolic CHF (congestive heart failure) ([place])    Anemia of chronic disease 12/27/2011   Long term (current) use of insulin ([place]) 11/03/2009   Gout, unspecified 02/28/2009   Iron deficiency anemia, unspecified 10/30/2008   Secondary hyperparathyroidism of renal origin ([place]) 10/30/2008   Type 2 diabetes mellitus with hyperglycemia, without long-term current use of insulin ([place]) 06/08/1984    Past Surgical History:  Procedure Laterality Date   A/V FISTULAGRAM N/A 10/04/2017   Procedure: A/V FISTULAGRAM;  Surgeon: Elam Dutch, MD;  Location: [place] [facility];  Service: Cardiovascular;  Laterality: N/A;   A/V SHUNTOGRAM Left 07/12/2020   Procedure: A/V SHUNTOGRAM;  Surgeon: Serafina Mitchell, MD;  Location: [facility];  Service: Cardiovascular;  Laterality: Left;   AMPUTATION Right 07/08/2021    Procedure: AMPUTATION ABOVE KNEE;  Surgeon: Elmore Guise, MD;  Location: [facility];  Service: Vascular;  Laterality: Right;   ANGIOPLASTY Left 09/24/2019   Procedure: Angioplasty Innominate Vien;  Surgeon: Serafina Mitchell, MD;  Location: [place];  Service: Vascular;  Laterality: Left;   AV FISTULA PLACEMENT Left 01/13/2013   Procedure: INSERTION OF ARTERIOVENOUS (AV) GORE-TEX GRAFT ARM;  Surgeon: Angelia Mould, MD;  Location: [place];  Service: Vascular;  Laterality: Left;   AV FISTULA PLACEMENT Left 05/05/2013   Procedure: INSERTION OF LEFT UPPER ARM  ARTERIOVENOUS GORTEX GRAFT;  Surgeon: Angelia Mould, MD;  Location: [place];  Service: Vascular;  Laterality: Left;   AV FISTULA PLACEMENT Left 11/26/2019   Procedure: INSERTION OF ARTERIOVENOUS (AV) GORE-TEX GRAFT, left  ARM;  Surgeon: Serafina Mitchell, MD;  Location: [place];  Service: Vascular;  Laterality: Left;   [place] REMOVAL Left 03/26/2013   Procedure: REMOVAL OF  NON INCORPORATED ARTERIOVENOUS GORETEX GRAFT ([place]) left arm *  repair of  left brachial artery with vein patch angioplasty.;  Surgeon: Angelia Mould, MD;  Location: [place];  Service: Vascular;  Laterality: Left;   CARDIAC CATHETERIZATION  August 26, 2012   CATARACT EXTRACTION W/ INTRAOCULAR LENS  IMPLANT, BILATERAL     COLONOSCOPY     CORONARY ARTERY BYPASS GRAFT N/A 09/13/2017   Procedure: CORONARY ARTERY BYPASS GRAFTING (CABG) times four LIMA  to LAD, SVG sequentially to OM and RAMUS Intermediate and SVG to Acute Marginal;  Surgeon: Melrose Nakayama, MD;  Location: [place];  Service: Open Heart Surgery;  Laterality: N/A;   DIALYSIS/PERMA CATHETER INSERTION N/A 02/28/2021   Procedure: DIALYSIS/PERMA CATHETER EXCHANGE;  Surgeon: Katha Cabal, MD;  Location: [place] [facility];  Service: Cardiovascular;  Laterality: N/A;   EMBOLECTOMY Right 08/27/2013   Procedure: EMBOLECTOMY;  Surgeon: Mal Misty, MD;  Location: [place];  Service: Vascular;  Laterality:  Right;  Thrombectomy of Radial and ulnar artery.   ENDOVASCULAR STENT INSERTION Left 09/24/2019   Procedure: Endovascular Stent Graft Insertion, Arm Graft;  Surgeon: Serafina Mitchell, MD;  Location: [facility] OR;  Service: Vascular;  Laterality: Left;   EYE SURGERY     laser.  and surgery for DM   fistula     RUE and wrist   FISTULOGRAM Left 09/24/2019   Procedure: SHUNTOGRAM OF ARTERIOVENOUS FISTULA LEFT ARM,;  Surgeon: Serafina Mitchell, MD;  Location: [place];  Service: Vascular;  Laterality: Left;   INSERTION OF DIALYSIS CATHETER Right 01/01/2013   Procedure: INSERTION OF DIALYSIS CATHETER right  internal jugular;  Surgeon: Serafina Mitchell, MD;  Location: [place];  Service: Vascular;  Laterality: Right;   INSERTION OF DIALYSIS CATHETER N/A 03/26/2013   Procedure: INSERTION OF DIALYSIS CATHETER Left internal jugular vein;  Surgeon: Angelia Mould, MD;  Location: [place];  Service: Vascular;  Laterality: N/A;   INSERTION OF DIALYSIS CATHETER Right 11/26/2019   Procedure: INSERTION OF DIALYSIS CATHETER, right internal jugular;  Surgeon: Serafina Mitchell, MD;  Location: [place];  Service: Vascular;  Laterality: Right;   IR THORACENTESIS ASP PLEURAL SPACE W/IMG GUIDE  09/30/2019   LEFT HEART CATH AND CORONARY ANGIOGRAPHY N/A 09/09/2017   Procedure: LEFT HEART CATH AND CORONARY ANGIOGRAPHY;  Surgeon: Troy Sine, MD;  Location: [place] [facility];  Service: Cardiovascular;  Laterality: N/A;   LIGATION OF ARTERIOVENOUS  FISTULA Right 12/30/2013   Procedure: REMOVAL OF SEGMENT OF GORTEX GRAFT AND FISTULA  AND REPAIR OF BRACHIAL ARTERY;  Surgeon: Mal Misty, MD;  Location: [place];  Service: Vascular;  Laterality: Right;   LOWER EXTREMITY ANGIOGRAPHY N/A 06/28/2021   Procedure: Lower Extremity Angiography;  Surgeon: Katha Cabal, MD;  Location: [place] [facility];  Service: Cardiovascular;  Laterality: N/A;   MITRAL VALVE REPAIR N/A 09/13/2017   Procedure: MITRAL VALVE REPAIR (MVR);  Surgeon:  Melrose Nakayama, MD;  Location: [place];  Service: Open Heart Surgery;  Laterality: N/A;   MITRAL VALVE REPAIR (MV)/CORONARY ARTERY BYPASS GRAFTING (CABG)  09/13/2017   PENILE PROSTHESIS IMPLANT     1997.. no card   PERIPHERAL VASCULAR BALLOON ANGIOPLASTY Left 10/04/2017   Procedure: PERIPHERAL VASCULAR BALLOON ANGIOPLASTY;  Surgeon: Elam Dutch, MD;  Location: [place] [facility];  Service: Cardiovascular;  Laterality: Left;  AVF   PERIPHERAL VASCULAR BALLOON ANGIOPLASTY Left 07/12/2020   Procedure: PERIPHERAL VASCULAR BALLOON ANGIOPLASTY;  Surgeon: Serafina Mitchell, MD;  Location: [place] [facility];  Service: Cardiovascular;  Laterality: Left;  ARM SHUNTOGRAM   REVISION OF ARTERIOVENOUS GORETEX GRAFT Left 09/24/2019   Procedure: REVISION OF ARTERIOVENOUS GORETEX GRAFT LEFT ARM;  Surgeon: Serafina Mitchell, MD;  Location: [facility];  Service: Vascular;  Laterality: Left;   TEE WITHOUT CARDIOVERSION N/A 06/11/2013   Procedure: TRANSESOPHAGEAL ECHOCARDIOGRAM (TEE);  Surgeon: Larey Dresser, MD;  Location: [facility] ENDOSCOPY;  Service: Cardiovascular;  Laterality: N/A;   TEE WITHOUT CARDIOVERSION N/A 09/13/2017   Procedure: TRANSESOPHAGEAL ECHOCARDIOGRAM (TEE);  Surgeon: Melrose Nakayama, MD;  Location: [place];  Service: Open Heart Surgery;  Laterality: N/A;   THROMBECTOMY W/ EMBOLECTOMY Left 03/26/2013   Procedure: Attempted thrombectomy of left arm arteriovenous goretex graft.;  Surgeon: Angelia Mould, MD;  Location: [facility] OR;  Service: Vascular;  Laterality: Left;   ULTRASOUND GUIDANCE FOR VASCULAR ACCESS Bilateral 09/24/2019   Procedure: Ultrasound Guidance For Vascular Access, Right Femoral Vein and Left Arm Graft;  Surgeon: Serafina Mitchell, MD;  Location: [facility] OR;  Service: Vascular;  Laterality: Bilateral;   VENOGRAM N/A 04/07/2013   Procedure: VENOGRAM;  Surgeon: Serafina Mitchell, MD;  Location: [facility] CATH LAB;  Service: Cardiovascular;  Laterality: N/A;    Prior to Admission medications    Medication Sig Start Date End Date Taking? Authorizing Provider  acetaminophen (TYLENOL) 500 MG tablet Take 1,000 mg by mouth every 6 (six) hours as needed for mild pain.    [provider]  allopurinol (ZYLOPRIM) 100 MG tablet Take 100 mg by mouth daily.    [provider]  Amino Acids-Protein Hydrolys (FEEDING SUPPLEMENT, PRO-STAT 64,) LIQD Take 30 mLs by mouth 3 (three) times daily with meals.    [provider]  amiodarone (PACERONE) 200 MG tablet Take 200 mg by mouth daily.    [provider]  aspirin EC 81 MG tablet Take 1 tablet (81 mg total) by mouth daily. 11/29/15   Rivet, Sindy Guadeloupe, MD  atorvastatin (LIPITOR) 40 MG tablet Take 40 mg by mouth at bedtime.    [provider]  docusate sodium (COLACE) 100 MG capsule Take 100 mg by mouth daily.    [provider]  levETIRAcetam (KEPPRA) 500 MG tablet Take 500 mg by mouth See admin instructions. MWF only: 500mg  TID STTS only: 500mg  BID    [provider]  midodrine (PROAMATINE) 10 MG tablet Take 1 tablet (10 mg total) by mouth 4 (four) times daily. 07/20/21   Swayze, Ava, DO  mirtazapine (REMERON) 15 MG tablet Take 1 tablet (15 mg total) by mouth at bedtime. 07/20/21   Swayze, Ava, DO  Nutritional Supplements (FEEDING SUPPLEMENT, NEPRO CARB STEADY,) LIQD Take 237 mLs by mouth 2 (two) times daily between meals. 10/07/17   Gold, Wayne E, [name]  pantoprazole (PROTONIX) 40 MG tablet Take 1 tablet (40 mg total) by mouth daily. 07/21/21   Swayze, Ava, DO  polyethylene glycol (MIRALAX / GLYCOLAX) 17 g packet Take 17 g by mouth daily.    [provider]  senna-docusate (SENOKOT-S) 8.6-50 MG tablet Take 1 tablet by mouth at bedtime.    [provider]  sertraline (ZOLOFT) 25 MG tablet Take 25 mg by mouth at bedtime.    [provider]  sevelamer carbonate (RENVELA) 800 MG tablet Take 800 mg by mouth 3 (three) times daily with meals.    [provider]     Allergies Patient has no known allergies.  Family History  Problem Relation Age of Onset   Heart disease Father    Diabetes  Sister    Diabetes Brother    Diabetes Son     Social History Social History   Tobacco Use   Smoking status: Former    Years: 7.00    Types: Cigarettes    Quit date: 06/10/1973    Years since quitting: 48.3   Smokeless tobacco: Never   Tobacco comments:    Quit in 1974.  Vaping Use   Vaping Use: Never used  Substance Use Topics   Alcohol use: No    Alcohol/week: 0.0 standard drinks   Drug use: No    Review of Systems  Review of Systems  Unable to perform ROS: Mental status change    ____________________________________________  PHYSICAL EXAM:      VITAL SIGNS: ED Triage Vitals [09/25/21 1139]  Enc Vitals Group     BP (!) 170/61     Pulse Rate 79     Resp 17     Temp 98.4 F (36.9 C)     Temp Source Oral     SpO2 95 %     Weight      Height      Head Circumference      Peak Flow      Pain Score      Pain Loc      Pain Edu?      Excl. in [place]?      Physical Exam Vitals and nursing note reviewed.  Constitutional:      General: He is not in acute distress.    Appearance: He is well-developed.  HENT:     Head: Normocephalic and atraumatic.  Eyes:     Conjunctiva/sclera: Conjunctivae normal.  Cardiovascular:     Rate and Rhythm: Normal rate and regular rhythm.     Heart sounds: Normal heart sounds. No murmur heard.   No friction rub.  Pulmonary:     Effort: Pulmonary effort is normal. No respiratory distress.     Breath sounds: Rales present. No wheezing.     Comments: Slight tachypnea with bibasilar Rales.  Diminished aeration in the left chest. Abdominal:     General: There is no distension.     Palpations: Abdomen is soft.     Tenderness: There is no abdominal tenderness.  Musculoskeletal:     Cervical back: Neck supple.  Skin:    General: Skin is warm.     Capillary Refill: Capillary refill takes less than 2  seconds.  Neurological:     Mental Status: He is alert.     Motor: No abnormal muscle tone.     Comments: Oriented to person but not place or time.  Speech slurred and slightly aphasic.  Cranial nerves appear intact.  He is at least antigravity bilateral upper and left lower extremity.  Status post amputation of the leg.  Neuro exam somewhat difficult due to lack of patient understanding or effort.      ____________________________________________   LABS (all labs ordered are listed, but only abnormal results are displayed)  Labs Reviewed  APTT - Abnormal; Notable for the following components:      Result Value   aPTT 45 (*)    All other components within normal limits  CBC - Abnormal; Notable for the following components:   RBC 4.09 (*)    Hemoglobin 11.5 (*)    HCT 34.6 (*)    RDW 16.3 (*)    All other components within normal limits  COMPREHENSIVE METABOLIC PANEL - Abnormal; Notable for the following components:  Sodium 134 (*)    Chloride 96 (*)    Glucose, Bld 136 (*)    BUN 38 (*)    Creatinine, Ser 4.40 (*)    Calcium 8.4 (*)    Albumin 3.2 (*)    AST 50 (*)    ALT 49 (*)    GFR, Estimated 13 (*)    All other components within normal limits  BLOOD GAS, VENOUS - Abnormal; Notable for the following components:   pH, Ven 7.47 (*)    pO2, Ven 46.0 (*)    Bicarbonate 34.9 (*)    Acid-Base Excess 9.9 (*)    All other components within normal limits  BRAIN NATRIURETIC PEPTIDE - Abnormal; Notable for the following components:   B Natriuretic Peptide 1,494.1 (*)    All other components within normal limits  CBG MONITORING, ED - Abnormal; Notable for the following components:   Glucose-Capillary 132 (*)    All other components within normal limits  TROPONIN I (HIGH SENSITIVITY) - Abnormal; Notable for the following components:   Troponin I (High Sensitivity) 28 (*)    All other components within normal limits  PROTIME-INR  DIFFERENTIAL  ETHANOL     ____________________________________________  EKG: Normal sinus rhythm, ventricular rate 71.  PR 206, QRS 107, QTc 508.  No acute ST elevations or depressions.  T wave inversions in the precordial leads.  No ST elevations. ________________________________________  RADIOLOGY All imaging, including plain films, CT scans, and ultrasounds, independently reviewed by me, and interpretations confirmed via formal radiology reads.  ED MD interpretation:   CT head: Small age-indeterminate lacunar infarct is new from prior  Official radiology report(s): DG Chest Portable 1 View  Result Date: 09/25/2021 CLINICAL DATA:  Hypoxia, dyspnea, dialysis patient EXAM: PORTABLE CHEST 1 VIEW COMPARISON:  06/27/2021 chest radiograph. FINDINGS: Mitral valve annuloplasty ring in place. Intact sternotomy wires. Low lung volumes. Inferior approach central venous catheter terminates at the inferior cavoatrial junction. Stable cardiomediastinal silhouette with normal heart size. No pneumothorax. Small to moderate loculated peripheral left pleural effusion, unchanged. No right pleural effusion. Diffuse prominence of the perihilar interstitial markings with patchy left lung base opacity and volume loss. Chronic calcified left thyroid nodule, unchanged. IMPRESSION: 1. Low lung volumes. Diffuse prominence of the perihilar interstitial markings with patchy left lung base opacity and volume loss, favor a combination of pulmonary edema and atelectasis. 2. Stable small to moderate loculated peripheral left pleural effusion. Electronically Signed   By: Ilona Sorrel M.D.   On: 09/25/2021 12:34   CT HEAD CODE STROKE WO CONTRAST  Addendum Date: 09/25/2021   ADDENDUM REPORT: 09/25/2021 12:05 ADDENDUM: Study discussed by telephone with Dr. Ellender Hose on 09/25/2021 at 12:04 . Electronically Signed   By: Genevie Ann M.D.   On: 09/25/2021 12:05   Result Date: 09/25/2021 CLINICAL DATA:  Code stroke. 75 year old male dialysis patient with  slurred speech, neurologic deficit. EXAM: CT HEAD WITHOUT CONTRAST TECHNIQUE: Contiguous axial images were obtained from the base of the skull through the vertex without intravenous contrast. COMPARISON:  Head CT 11/14/2020 and earlier. FINDINGS: Brain: Stable cerebral volume. No midline shift, mass effect, or evidence of intracranial mass lesion. No ventriculomegaly. No acute intracranial hemorrhage identified. Stable patchy bilateral chronic appearing cerebellar infarcts. Heterogeneity in the brainstem has not definitely changed. A small round hypodensity in the medial right thalamus is more conspicuous since January but age indeterminate. Extensive vascular calcifications in the bilateral cerebral white matter and deep gray nuclei. No convincing acute cortically based infarct.  Gray-white matter differentiation elsewhere appears stable. Vascular: Extensive Calcified atherosclerosis at the skull base. No suspicious intracranial vascular hyperdensity. Skull: No acute osseous abnormality identified. Sinuses/Orbits: Improved chronic right sphenoid sinusitis. Other Visualized paranasal sinuses and mastoids are stable and well aerated. Other: Diffuse calcified scalp vessel atherosclerosis. Similar intraorbital vessel calcification. ASPECTS [facility] Stroke Program Early CT Score) Total score (0-10 with 10 being normal): 10 IMPRESSION: 1. Small age indeterminate lacunar infarct in the medial right thalamus. No associated hemorrhage or mass effect. 2. Otherwise stable CT appearance of chronic small vessel and cerebellar ischemic disease since January. ASPECTS 10. 3. Diffuse severe calcified atherosclerosis. Electronically Signed: By: Genevie Ann M.D. On: 09/25/2021 11:56    ____________________________________________  PROCEDURES   Procedure(s) performed (including Critical Care):  Procedures  ____________________________________________  INITIAL IMPRESSION / MDM / [place] / ED COURSE  As part of my  medical decision making, I reviewed the following data within the [facility] notes reviewed and incorporated, Old chart reviewed, Notes from prior ED visits, and [place] Controlled Substance Database       *ETHON WYMER was evaluated in Emergency Department on 09/25/2021 for the symptoms described in the history of present illness. He was evaluated in the context of the global COVID-19 pandemic, which necessitated consideration that the patient might be at risk for infection with the SARS-CoV-2 virus that causes COVID-19. Institutional protocols and algorithms that pertain to the evaluation of patients at risk for COVID-19 are in a state of rapid change based on information released by regulatory bodies including the CDC and federal and state organizations. These policies and algorithms were followed during the patient's care in the ED.  Some ED evaluations and interventions may be delayed as a result of limited staffing during the pandemic.*     Medical Decision Making: 75 year old male here with reported increased slurred speech and confusion.  Patient was initially activated as a code stroke.  Dr. Cheral Marker present at bedside.  Patient also noted to be hypoxic, improved on 2 L nasal cannula.  Regarding his altered mental status, it is somewhat unclear how far off he is from baseline.  He has reported chronic dysarthria in his records and per discussion with a skilled nursing facility by myself and nursing.  CT head shows indeterminate lacunar infarct but no acute bleed or other abnormality.  Due to unclear baseline with overall similarity to his chronic issues, feel the risks of tPA outweigh the benefits based on discussion with the patient and Dr. Cheral Marker.  Will plan to admit for stroke evaluation and work-up.  Otherwise, regarding his hypoxia, he does appear mildly fluid overloaded.  He seems to have a chronic left-sided pleural effusion which is unchanged, though he does have  slight signs of fluid overload.  He did complete most of his session today.  Will consult nephrology for evaluation, admit to medicine.  He is improved on 2 L nasal cannula.  Electrolytes otherwise acceptable for his end-stage renal status.  Hemoglobin at baseline.  INR 1.1.  Blood gas reassuring.  He does not appear intoxicated.  ____________________________________________  FINAL CLINICAL IMPRESSION(S) / ED DIAGNOSES  Final diagnoses:  Acute encephalopathy  Slurred speech  Chronic respiratory failure with hypoxia (HCC)     MEDICATIONS GIVEN DURING THIS VISIT:  Medications - No data to display    ED Discharge Orders     None        Note:  This document was prepared using Dragon voice recognition  software and may include unintentional dictation errors.   Duffy Bruce, MD 09/25/21 1434

## 2021-09-25 NOTE — ED Notes (Signed)
Patient's change in condition communicated with Dr. Tobie Poet and Dr. Damita Dunnings. Patient lethargic, opens eyes to painful stimulation and able to state first name then falls back to sleep. Patient with HR 30-40's for approximately 30 minutes. HR now 50-60's. Resp even, unlabored on 2L per [place].

## 2021-09-25 NOTE — Code Documentation (Signed)
Stroke Response Nurse Documentation Code Documentation  Ernest [name] is a 75 y.o. male arriving to [facility] ED via [facility] on 09/25/2021 . Code stroke was activated by Triage RN.  [facility] from [facility] where he arrived around 0615 this am, per staff not himself. Per staff pt was leaning to the right and was difficult to understand when arrived. Per [employer] staff pt has not been A/O x 4 since his amputation 3 months ago. Code stroke was activated d/t speech.  Stroke team at the bedside on patient arrival. Labs drawn and patient cleared for CT by Dr. Ellender Hose. Patient to CT with team. The following imaging was completed:  CT head. Patient is not a candidate for IV Thrombolytic per Dr Cheral Marker.   Care/Plan: Admit for stroke workup.   Bedside handoff with ED RN Amber.    Velta Addison  Stroke Response RN

## 2021-09-25 NOTE — Consult Note (Addendum)
NEURO HOSPITALIST CONSULT NOTE   Requestig physician: Dr. Sidney Ace  Reason for Consult: Speech not at baseline  History obtained from:  Caretaker at [facility] and Chart     HPI:                                                                                                                                          Ernest [name] is an 75 y.o. male with a PMHx of CAD s/p CABG, iron deficiency anemia, CHF, COPD, DVT, ESRD on HD, gout, hepatitis C, seizures (last seizure in 2018), HLD, HTN, mitral valve repair, retinopathy, bilateral cataract surgeries, several strokes with deficits including left sided weakness and sphasia, DM2 and right AKA 3 months ago who presented via EMS from dialysis with acute onset of worsened speech impairment. Per report, the patient 'almost' finished his dialysis treatment this morning, when dialysis staff noted that his speech had become worse than baseline; it seemed to be slurred and he was also more confused than his baseline. Staff did note that he was generally weak at dialysis and was sitting leaned over in his chair; towards the end of the dialysis treatment he was weak to the point that he kept sliding down from his chair. EMS stroke screen was negative for new findings. CBG was 121. O2 saturation was as low as 83% when patient first arrived to the ED, which improved with O2 via [place]. BP 130/100 with HR 80. Code Stroke was activated in the ED. It was unclear when he was last normal.   Per his caretaker at his living facility [facility]), his cognition has been impaired since right AKA surgery approximately 3 months ago. He is also hard of hearing at baseline. At baseline his speech is garbled, but he can communicate his needs and carry on a basic conversation.   STAT head CT revealed a small age indeterminate lacunar infarct in the medial right thalamus with no associated hemorrhage, as well as chronic small vessel and cerebellar ischemic disease  that was stable since January. CT also revealed diffuse severe calcified atherosclerosis and small intraparenchymal calcifications scattered at several locations within the brain.   Home medications include ASA, atorvastatin and Keppra.   Past Medical History:  Diagnosis Date   Abnormal stress test    s/p cath November 2013 with modest disease involving the ostial left main, proximal LAD, proximal RCA - do not appear to be hemodynamically signficant; mild LV dysfunction   Anemia    low iron   Anxiety    Arthritis    Bacteremia    Chronic systolic CHF (congestive heart failure) (HCC)    COPD (chronic obstructive pulmonary disease) ([place])    Coronary artery disease    a. per cath report 2013, b.  09/13/2017-CABG X4 & Mitral valve repair   Depression    DVT (deep venous thrombosis) (HCC)    arm - right- finished blood thinner- not sure when it   Ejection fraction < 50%    Erectile dysfunction    penile implant   ESRD (end stage renal disease) ([place])    [facility] on a MWF schedule.      Gout    Hx: of   Hepatitis C    History of seizures    last one 2018   Hyperlipidemia    Hypertension    Mitral valve disease    s/p Mitral repair 09/13/17   Obesity    Retinopathy    Shortness of breath    Hx: of with exertion   Stroke [facility])    "several" left side weakness, speech affected   Type II or unspecified type diabetes mellitus without mention of complication, not stated as uncontrolled    adult onset    Past Surgical History:  Procedure Laterality Date   A/V FISTULAGRAM N/A 10/04/2017   Procedure: A/V FISTULAGRAM;  Surgeon: Elam Dutch, MD;  Location: [place] [facility];  Service: Cardiovascular;  Laterality: N/A;   A/V SHUNTOGRAM Left 07/12/2020   Procedure: A/V SHUNTOGRAM;  Surgeon: Serafina Mitchell, MD;  Location: [place] [facility];  Service: Cardiovascular;  Laterality: Left;   AMPUTATION Right 07/08/2021   Procedure: AMPUTATION ABOVE KNEE;  Surgeon: Elmore Guise, MD;   Location: [facility];  Service: Vascular;  Laterality: Right;   ANGIOPLASTY Left 09/24/2019   Procedure: Angioplasty Innominate Vien;  Surgeon: Serafina Mitchell, MD;  Location: [place];  Service: Vascular;  Laterality: Left;   AV FISTULA PLACEMENT Left 01/13/2013   Procedure: INSERTION OF ARTERIOVENOUS (AV) GORE-TEX GRAFT ARM;  Surgeon: Angelia Mould, MD;  Location: [place];  Service: Vascular;  Laterality: Left;   AV FISTULA PLACEMENT Left 05/05/2013   Procedure: INSERTION OF LEFT UPPER ARM  ARTERIOVENOUS GORTEX GRAFT;  Surgeon: Angelia Mould, MD;  Location: [place];  Service: Vascular;  Laterality: Left;   AV FISTULA PLACEMENT Left 11/26/2019   Procedure: INSERTION OF ARTERIOVENOUS (AV) GORE-TEX GRAFT, left  ARM;  Surgeon: Serafina Mitchell, MD;  Location: [place];  Service: Vascular;  Laterality: Left;   [place] REMOVAL Left 03/26/2013   Procedure: REMOVAL OF  NON INCORPORATED ARTERIOVENOUS GORETEX GRAFT ([place]) left arm * repair of  left brachial artery with vein patch angioplasty.;  Surgeon: Angelia Mould, MD;  Location: [place];  Service: Vascular;  Laterality: Left;   CARDIAC CATHETERIZATION  August 26, 2012   CATARACT EXTRACTION W/ INTRAOCULAR LENS  IMPLANT, BILATERAL     COLONOSCOPY     CORONARY ARTERY BYPASS GRAFT N/A 09/13/2017   Procedure: CORONARY ARTERY BYPASS GRAFTING (CABG) times four LIMA  to LAD, SVG sequentially to OM and RAMUS Intermediate and SVG to Acute Marginal;  Surgeon: Melrose Nakayama, MD;  Location: [place];  Service: Open Heart Surgery;  Laterality: N/A;   DIALYSIS/PERMA CATHETER INSERTION N/A 02/28/2021   Procedure: DIALYSIS/PERMA CATHETER EXCHANGE;  Surgeon: Katha Cabal, MD;  Location: [place] [facility];  Service: Cardiovascular;  Laterality: N/A;   EMBOLECTOMY Right 08/27/2013   Procedure: EMBOLECTOMY;  Surgeon: Mal Misty, MD;  Location: [place];  Service: Vascular;  Laterality: Right;  Thrombectomy of Radial and ulnar artery.   ENDOVASCULAR  STENT INSERTION Left 09/24/2019   Procedure: Endovascular Stent Graft Insertion, Arm Graft;  Surgeon: Serafina Mitchell, MD;  Location: [facility];  Service: Vascular;  Laterality: Left;   EYE SURGERY     laser.  and surgery for DM   fistula     RUE and wrist   FISTULOGRAM Left 09/24/2019   Procedure: SHUNTOGRAM OF ARTERIOVENOUS FISTULA LEFT ARM,;  Surgeon: Serafina Mitchell, MD;  Location: [place];  Service: Vascular;  Laterality: Left;   INSERTION OF DIALYSIS CATHETER Right 01/01/2013   Procedure: INSERTION OF DIALYSIS CATHETER right  internal jugular;  Surgeon: Serafina Mitchell, MD;  Location: [place];  Service: Vascular;  Laterality: Right;   INSERTION OF DIALYSIS CATHETER N/A 03/26/2013   Procedure: INSERTION OF DIALYSIS CATHETER Left internal jugular vein;  Surgeon: Angelia Mould, MD;  Location: [place];  Service: Vascular;  Laterality: N/A;   INSERTION OF DIALYSIS CATHETER Right 11/26/2019   Procedure: INSERTION OF DIALYSIS CATHETER, right internal jugular;  Surgeon: Serafina Mitchell, MD;  Location: [place];  Service: Vascular;  Laterality: Right;   IR THORACENTESIS ASP PLEURAL SPACE W/IMG GUIDE  09/30/2019   LEFT HEART CATH AND CORONARY ANGIOGRAPHY N/A 09/09/2017   Procedure: LEFT HEART CATH AND CORONARY ANGIOGRAPHY;  Surgeon: Troy Sine, MD;  Location: [place] CV [facility];  Service: Cardiovascular;  Laterality: N/A;   LIGATION OF ARTERIOVENOUS  FISTULA Right 12/30/2013   Procedure: REMOVAL OF SEGMENT OF GORTEX GRAFT AND FISTULA  AND REPAIR OF BRACHIAL ARTERY;  Surgeon: Mal Misty, MD;  Location: [place];  Service: Vascular;  Laterality: Right;   LOWER EXTREMITY ANGIOGRAPHY N/A 06/28/2021   Procedure: Lower Extremity Angiography;  Surgeon: Katha Cabal, MD;  Location: [place] [facility];  Service: Cardiovascular;  Laterality: N/A;   MITRAL VALVE REPAIR N/A 09/13/2017   Procedure: MITRAL VALVE REPAIR (MVR);  Surgeon: Melrose Nakayama, MD;  Location: [place];  Service: Open Heart  Surgery;  Laterality: N/A;   MITRAL VALVE REPAIR (MV)/CORONARY ARTERY BYPASS GRAFTING (CABG)  09/13/2017   PENILE PROSTHESIS IMPLANT     1997.. no card   PERIPHERAL VASCULAR BALLOON ANGIOPLASTY Left 10/04/2017   Procedure: PERIPHERAL VASCULAR BALLOON ANGIOPLASTY;  Surgeon: Elam Dutch, MD;  Location: [place] [facility];  Service: Cardiovascular;  Laterality: Left;  AVF   PERIPHERAL VASCULAR BALLOON ANGIOPLASTY Left 07/12/2020   Procedure: PERIPHERAL VASCULAR BALLOON ANGIOPLASTY;  Surgeon: Serafina Mitchell, MD;  Location: [place] [facility];  Service: Cardiovascular;  Laterality: Left;  ARM SHUNTOGRAM   REVISION OF ARTERIOVENOUS GORETEX GRAFT Left 09/24/2019   Procedure: REVISION OF ARTERIOVENOUS GORETEX GRAFT LEFT ARM;  Surgeon: Serafina Mitchell, MD;  [facility];  Service: Vascular;  Laterality: Left;   TEE WITHOUT CARDIOVERSION N/A 06/11/2013   Procedure: TRANSESOPHAGEAL ECHOCARDIOGRAM (TEE);  Surgeon: Larey Dresser, MD;  Location: [facility] END[facility];  Service: Cardiovascular;  Laterality: N/A;   TEE WITHOUT CARDIOVERSION N/A 09/13/2017   Procedure: TRANSESOPHAGEAL ECHOCARDIOGRAM (TEE);  Surgeon: Melrose Nakayama, MD;  Location: [place];  Service: Open Heart Surgery;  Laterality: N/A;   THROMBECTOMY W/ EMBOLECTOMY Left 03/26/2013   Procedure: Attempted thrombectomy of left arm arteriovenous goretex graft.;  Surgeon: Angelia Mould, MD;  Location: [facility] OR;  Service: Vascular;  Laterality: Left;   ULTRASOUND GUIDANCE FOR VASCULAR ACCESS Bilateral 09/24/2019   Procedure: Ultrasound Guidance For Vascular Access, Right Femoral Vein and Left Arm Graft;  Surgeon: Serafina Mitchell, MD;  Location: [facility] OR;  Service: Vascular;  Laterality: Bilateral;   VENOGRAM N/A 04/07/2013   Procedure: VENOGRAM;  Surgeon: Serafina Mitchell, MD;  Location: [place] CATH LAB;  Service: Cardiovascular;  Laterality: N/A;    Family History  Problem Relation Age of Onset   Heart disease Father    Diabetes Sister     Diabetes Brother    Diabetes Son             Social History:  reports that he quit smoking about 48 years ago. He has never used smokeless tobacco. He reports that he does not drink alcohol and does not use drugs.  [name]  HOME MEDICATIONS:                                                                                                                      No current facility-administered medications on file prior to encounter.   Current Outpatient Medications on File Prior to Encounter  Medication Sig Dispense Refill   acetaminophen (TYLENOL) 500 MG tablet Take 1,000 mg by mouth every 6 (six) hours as needed for mild pain.     allopurinol (ZYLOPRIM) 100 MG tablet Take 100 mg by mouth daily.     Amino Acids-Protein Hydrolys (FEEDING SUPPLEMENT, PRO-STAT 64,) LIQD Take 30 mLs by mouth 3 (three) times daily with meals.     amiodarone (PACERONE) 200 MG tablet Take 200 mg by mouth daily.     aspirin EC 81 MG tablet Take 1 tablet (81 mg total) by mouth daily. 30 tablet 11   atorvastatin (LIPITOR) 40 MG tablet Take 40 mg by mouth at bedtime.     docusate sodium (COLACE) 100 MG capsule Take 100 mg by mouth daily.     levETIRAcetam (KEPPRA) 500 MG tablet Take 500 mg by mouth See admin instructions. MWF only: 500mg  TID STTS only: 500mg  BID     midodrine (PROAMATINE) 10 MG tablet Take 1 tablet (10 mg total) by mouth 4 (four) times daily. 122 tablet 0   mirtazapine (REMERON) 15 MG tablet Take 1 tablet (15 mg total) by mouth at bedtime. 30 tablet 0   Nutritional Supplements (FEEDING SUPPLEMENT, NEPRO CARB STEADY,) LIQD Take 237 mLs by mouth 2 (two) times daily between meals.  0   pantoprazole (PROTONIX) 40 MG tablet Take 1 tablet (40 mg total) by mouth daily. 30 tablet 0   polyethylene glycol (MIRALAX / GLYCOLAX) 17 g packet Take 17 g by mouth daily.     senna-docusate (SENOKOT-S) 8.6-50 MG tablet Take 1 tablet by mouth at bedtime.     sertraline (ZOLOFT) 25 MG tablet Take 25 mg by mouth  at bedtime.     sevelamer carbonate (RENVELA) 800 MG tablet Take 800 mg by mouth 3 (three) times daily with meals.      ROS:  Unable to obtain a reliable ROS due to confusion.   Blood pressure (!) 170/61, pulse 79, temperature 98.4 F (36.9 C), temperature source Oral, resp. rate 17, SpO2 95 %.  General Examination:                                                                                                       Physical Exam  HEENT-  [place]/AT  Lungs- Respirations unlabored Extremities- Right AKA. Distal end of left big toe appears to be necrotic.   Neurological Examination Mental Status: Awake and alert. Poor attention. Mildly agitated. Speech alternates from garbled to dysarthric but intelligible. Tends to speak with short phrases and exclamations that are relatively limited in information content. Does know that he is in the hospital and that he just had dialysis. Attention waxes and wanes, and patient with rapidly fluctuating drowsiness. Attends in one instance to some of the background conversation of the nurses and makes a clarification that is factually correct. He will follow most commands but often requires frequent repetition, encouragement and coaching. Basic naming intact. Poorly oriented.  Cranial Nerves: II: Does not blink to threat consistently in either eye, but does so more often when testing right eye than left. Pupils are equal. Will briefly fixate on examiner and other visual stimuli. Can name some objects presented to him visually and with touch.  III,IV, VI: Closes eyes frequently during exam, appearing drowsy. Eyes are conjugate but he has difficulty tracking visual stimuli.  V: Reacts to eyelid stimulation bilaterally VII: No definite facial droop. The lower quadrants are asymmetric in the context of being edentulous.  VIII: HOH IX,X:  No hypophonia XI: Head is midline XII: Midline tongue extension after numerous attempts Motor: RUE 4/5 with poor ability to follow commands.  LUE 4/5 with poor ability to follow commands.  RLE with 4/5 hip flexion in the context of AKA LLE with 4-/5 hip flexion and knee extension with poor ability to attend to commands.  Sensory: Grossly intact to touch x 4. Does not attend to questions for more detailed testing.  Deep Tendon Reflexes: 1+ bilateral brachioradialis and left patellar.  Plantars: Left mute. Right not testable due to AKA  Cerebellar: No gross ataxia with basic finger to nose testing bilaterally (does not attend sufficiently to go back and forth several times) Gait: Unable to assess   Lab Results: Basic Metabolic Panel: No results for input(s): NA, K, CL, CO2, GLUCOSE, BUN, CREATININE, CALCIUM, MG, PHOS in the last 168 hours.  CBC: No results for input(s): WBC, NEUTROABS, HGB, HCT, MCV, PLT in the last 168 hours.  Cardiac Enzymes: No results for input(s): CKTOTAL, CKMB, CKMBINDEX, TROPONINI in the last 168 hours.  Lipid Panel: No results for input(s): CHOL, TRIG, HDL, CHOLHDL, VLDL, LDLCALC in the last 168 hours.  Imaging: No results found.   Assessment: 75 year old male with a complicated PMHx including ESRD, CAD, seizures and prior strokes, who presented via EMS from dialysis with acute onset of worsened speech impairment. Per report, the patient 'almost' finished his dialysis treatment this morning, when dialysis  staff noted that his speech had become worse than baseline; it seemed to be slurred and he was also more confused than his baseline. Staff did note that he was generally weak at dialysis and was sitting leaned over in his chair; towards the end of the dialysis treatment he was weak to the point that he kept sliding down from his chair. EMS stroke screen was negative for new findings. CBG was 121. O2 saturation was as low as 83% when patient first arrived to the  ED, which improved with O2 via [place]. BP 130/100 with HR 80. Code Stroke was activated in the ED. It was unclear when he was last normal. Per his caretaker at his living facility [facility]), his cognition has been impaired since right AKA surgery approximately 3 months ago. He is also hard of hearing at baseline. At baseline his speech is garbled, but he can communicate his needs and carry on a basic conversation.  Home medications include ASA, atorvastatin and Keppra.  1. Exam reveals dysarthria, hesitant speech and confusion. When patient was placed on speaker phone to speak with his regular caretaker at [facility], she noted that his speech at the time of Neurology evaluation to be at his baseline. EDP noted that the patient seemed to improve somewhat after his O2 sats were normalized with oxygen supplementation, from an initial reading of 83%. No evidence for jerking, twitching or other clinical seizure activity on exam.  2. CT head: Small age indeterminate lacunar infarct in the medial right thalamus. No associated hemorrhage or mass effect. Otherwise stable CT appearance of chronic small vessel disease and cerebellar ischemic disease since January. ASPECTS 10. Diffuse severe calcified atherosclerosis and scattered chronic brain intraparenchymal calcifications are also noted.  3. Stroke risk factors: CAD, anemia, CHF, DVT, ESRD, HLD, HTN, prior strokes and DM2  4. Most likely etiology for his acute presentation is delirium, most likely toxic/metabolic, with hypoxia being a likely cause. I also have seen on multiple occasions patients presenting with confusion and other transient neurological deficits during and after dialysis, with a tendency for these presentations to occur towards the end of treatment and in some cases resolving spontaneously after added volume. Subclinical seizure followed by postictal confusion, TIA or a small stroke are also differential diagnostic considerations.    Recommendations: 1. Continue his home ASA and atorvastatin.  2. Continue Keppra.  3. Draw a thiamine level then start high-dose thiamine at 500 mg IV TID x 3 days, then 100 mg po qd thereafter.  4. MRI brain.  5. EEG tomorrow (ordered).   Electronically signed: Dr. Kerney Elbe 09/25/2021, 11:48 AM

## 2021-09-25 NOTE — ED Triage Notes (Signed)
Arrives via [facility] from dialysis.  Per report, patient 'almost' finished his dialysis treatment today.  Dialysis staff c/o patient speech worse than baseline. Patient has history of stroke with right sided weakness, right facial droop and speech deficit.  EMS stroke screen Negative for new findings.  CBG:  121  P: 80 SpO2: 97a%---RA -- wears home oxygen 2l/ [place] BP:  130/100.    Dialysis  M-W--F

## 2021-09-25 NOTE — Progress Notes (Signed)
I responded to a page from the nurse to provide spiritual support for the patient's daughter. I visited the patient's room with his daughter present. I shared words of encouragement and led in prayer.    09/25/21 2150  Clinical Encounter Type  Visited With Patient and family together  Visit Type Initial;Spiritual support  Referral From Nurse  Consult/Referral To Chaplain  Spiritual Encounters  Spiritual Needs Prayer   Chaplain Dr Redgie Grayer

## 2021-09-25 NOTE — ED Notes (Signed)
Called [facility] to initiate Code Stroke @ [phone] spoke to [place] who will activate page

## 2021-09-25 NOTE — Care Management Note (Signed)
Episode of bradycardia with transient hypotension  Currently rate in the 60s, BP 182/89 . Continued altered mental status is unchanged from on arrival per description on admitting H&P. Will transfer to higher level of care.

## 2021-09-25 NOTE — Progress Notes (Signed)
Responding to Code Stroke. Patient being cared for by staff. Informed the patient is hard of hearing. Spoke to him briefly, also informed family has been contacted although none present.

## 2021-09-25 NOTE — Consult Note (Signed)
[facility] Consult Note: 09/25/2021    Date of Admission:  09/25/2021           Reason for Consult: ESRD    Referring Provider: Christel Mormon, MD Primary Care Provider: Patient, [name] (Inactive)   History of Presenting Illness:  Ernest [name] is a 75 y.o. male  Patient was sent from dialysis center because of altered mental status and difficulty in speech.  Patient is a resident of [facility].    Patient dialyzes at [place] on Monday Wednesday and Friday.  Today he finished all of his treatment except the last 15 minutes. This case was discussed with the dialysis nurse today.  They stated that he was noted to be generally weak today and was sitting leaned over in his chair.  After the treatment he was weak to the point that he kept sliding down from his chair.  His speech is normally garbled but he carries on a conversation.  The staff today could not understand what he was saying.  0.6 L was removed at his dialysis treatment. Patient is admitted for stroke work-up.  Patient was seen in the emergency room.  He appears comfortable.  He is on 2 L nasal cannula, sats of 99%.  He is able to follow simple commands and answer yes/no questions appropriately.  He is anuric.  He has a right femoral PermCath.  Review of Systems: ROS-limited due to patient's medical condition.  See HPI  Past Medical History:  Diagnosis Date   Abnormal stress test    s/p cath November 2013 with modest disease involving the ostial left main, proximal LAD, proximal RCA - do not appear to be hemodynamically signficant; mild LV dysfunction   Anemia    low iron   Anxiety    Arthritis    Bacteremia    Chronic systolic CHF (congestive heart failure) (HCC)    COPD (chronic obstructive pulmonary disease) ([place])    Coronary artery disease    a. per cath report 2013, b. 09/13/2017-CABG X4 & Mitral valve repair   Depression    DVT (deep venous thrombosis) (HCC)    arm -  right- finished blood thinner- not sure when it   Ejection fraction < 50%    Erectile dysfunction    penile implant   ESRD (end stage renal disease) ([place])    [facility] on a MWF schedule.      Gout    Hx: of   Hepatitis C    History of seizures    last one 2018   Hyperlipidemia    Hypertension    Mitral valve disease    s/p Mitral repair 09/13/17   Obesity    Retinopathy    Shortness of breath    Hx: of with exertion   Stroke [facility])    "several" left side weakness, speech affected   Type II or unspecified type diabetes mellitus without mention of complication, not stated as uncontrolled    adult onset    Social History   Tobacco Use   Smoking status: Former    Years: 7.00    Types: Cigarettes    Quit date: 06/10/1973    Years since quitting: 48.3   Smokeless tobacco: Never   Tobacco comments:    Quit in 1974.  Vaping Use   Vaping Use: Never used  Substance Use Topics   Alcohol use: No    Alcohol/week: 0.0 standard drinks   Drug use:  No    Family History  Problem Relation Age of Onset   Heart disease Father    Diabetes Sister    Diabetes Brother    Diabetes Son      OBJECTIVE: Blood pressure (!) 176/83, pulse 67, temperature 98.4 F (36.9 C), temperature source Oral, resp. rate (!) 22, SpO2 98 %.  Physical Exam Physical Exam: General:  No acute distress, laying in the bed, chronically ill-appearing  HEENT  anicteric, moist oral mucous membrane  Pulm/lungs  normal breathing effort, lungs are clear to auscultation, [place] O2  CVS/Heart  regular rhythm, no rub or gallop  Abdomen:   Soft, nontender  Extremities:  No peripheral edema, right AKA  Neurologic:  Alert, able to follow commands  Skin:  No acute rashes  Right femoral tunneled dialysis catheter   Lab Results Lab Results  Component Value Date   WBC 7.9 09/25/2021   HGB 11.5 (L) 09/25/2021   HCT 34.6 (L) 09/25/2021   MCV 84.6 09/25/2021   PLT 186 09/25/2021    Lab Results  Component Value Date    CREATININE 4.40 (H) 09/25/2021   BUN 38 (H) 09/25/2021   NA 134 (L) 09/25/2021   K 4.3 09/25/2021   CL 96 (L) 09/25/2021   CO2 29 09/25/2021    Lab Results  Component Value Date   ALT 49 (H) 09/25/2021   AST 50 (H) 09/25/2021   ALKPHOS 84 09/25/2021   BILITOT 0.7 09/25/2021     Microbiology: No results found for this or any previous visit (from the past 240 hour(s)).  Medications: Scheduled Meds: Continuous Infusions: PRN Meds:.  [name]  Urinalysis: No results for input(s): COLORURINE, LABSPEC, PHURINE, GLUCOSEU, HGBUR, BILIRUBINUR, KETONESUR, PROTEINUR, UROBILINOGEN, NITRITE, LEUKOCYTESUR in the last 72 hours.  Invalid input(s): APPERANCEUR    Imaging: DG Chest Portable 1 View  Result Date: 09/25/2021 CLINICAL DATA:  Hypoxia, dyspnea, dialysis patient EXAM: PORTABLE CHEST 1 VIEW COMPARISON:  06/27/2021 chest radiograph. FINDINGS: Mitral valve annuloplasty ring in place. Intact sternotomy wires. Low lung volumes. Inferior approach central venous catheter terminates at the inferior cavoatrial junction. Stable cardiomediastinal silhouette with normal heart size. No pneumothorax. Small to moderate loculated peripheral left pleural effusion, unchanged. No right pleural effusion. Diffuse prominence of the perihilar interstitial markings with patchy left lung base opacity and volume loss. Chronic calcified left thyroid nodule, unchanged. IMPRESSION: 1. Low lung volumes. Diffuse prominence of the perihilar interstitial markings with patchy left lung base opacity and volume loss, favor a combination of pulmonary edema and atelectasis. 2. Stable small to moderate loculated peripheral left pleural effusion. Electronically Signed   By: Ilona Sorrel M.D.   On: 09/25/2021 12:34   CT HEAD CODE STROKE WO CONTRAST  Addendum Date: 09/25/2021   ADDENDUM REPORT: 09/25/2021 12:05 ADDENDUM: Study discussed by telephone with Dr. Ellender Hose on 09/25/2021 at 12:04 . Electronically Signed    By: Genevie Ann M.D.   On: 09/25/2021 12:05   Result Date: 09/25/2021 CLINICAL DATA:  Code stroke. 75 year old male dialysis patient with slurred speech, neurologic deficit. EXAM: CT HEAD WITHOUT CONTRAST TECHNIQUE: Contiguous axial images were obtained from the base of the skull through the vertex without intravenous contrast. COMPARISON:  Head CT 11/14/2020 and earlier. FINDINGS: Brain: Stable cerebral volume. No midline shift, mass effect, or evidence of intracranial mass lesion. No ventriculomegaly. No acute intracranial hemorrhage identified. Stable patchy bilateral chronic appearing cerebellar infarcts. Heterogeneity in the brainstem has not definitely changed. A small round hypodensity in the medial right  thalamus is more conspicuous since January but age indeterminate. Extensive vascular calcifications in the bilateral cerebral white matter and deep gray nuclei. No convincing acute cortically based infarct. Gray-white matter differentiation elsewhere appears stable. Vascular: Extensive Calcified atherosclerosis at the skull base. No suspicious intracranial vascular hyperdensity. Skull: No acute osseous abnormality identified. Sinuses/Orbits: Improved chronic right sphenoid sinusitis. Other Visualized paranasal sinuses and mastoids are stable and well aerated. Other: Diffuse calcified scalp vessel atherosclerosis. Similar intraorbital vessel calcification. ASPECTS [facility] Stroke Program Early CT Score) Total score (0-10 with 10 being normal): 10 IMPRESSION: 1. Small age indeterminate lacunar infarct in the medial right thalamus. No associated hemorrhage or mass effect. 2. Otherwise stable CT appearance of chronic small vessel and cerebellar ischemic disease since January. ASPECTS 10. 3. Diffuse severe calcified atherosclerosis. Electronically Signed: By: Genevie Ann M.D. On: 09/25/2021 11:56      Assessment/Plan:  Ernest [name] is a 75 y.o. male with medical problems of  ESRD, COPD, chronic systolic CHF,  coronary artery disease status post CABG and mitral valve repair in 2018, depression, gout, hepatitis C, history of seizures, hyperlipidemia, stroke, history of diabetes, severe diffuse calcified atherosclerosis, chronic small vessel ischemic changes and history of indeterminate lacunar infarct in the brain  was admitted on 09/25/2021 for :  TIA (transient ischemic attack) [G45.9]  #Hypertension in setting of end-stage renal disease Patient dialyzes at [place] on Monday Wednesday and Friday shifts.  He received almost his full treatment today.  At present, volume status and electrolytes are acceptable.  No acute indication for dialysis today.  We will set up next treatment for Wednesday as per his usual schedule. At present, his blood pressure is elevated at 178/133.  As outpatient, he requires midodrine.  Recommend to hold midodrine for now and follow blood pressure trends.  #Anemia of chronic kidney disease Lab Results  Component Value Date   HGB 11.5 (L) 09/25/2021  Hemoglobin levels are acceptable.  No indication for Epogen at present.   #Secondary hyperparathyroidism of renal origin N 25.81  Lab Results  Component Value Date   PTH 63 10/05/2017   PTH Comment 10/05/2017   CALCIUM 8.4 (L) 09/25/2021   CAION 1.05 (L) 07/12/2020   PHOS 5.2 (H) 07/12/2021    Monitor calcium and phosphorus during this admission.   Kamaiyah Uselton Candiss Norse 09/25/21

## 2021-09-25 NOTE — ED Notes (Signed)
Patient with heart rate brady down at 30's. Opens eyes to painful stimulation and states name but falls back asleep. CBG 116. BP 99/56. HR now 40-60's. Dr. Posey Pronto paged to notify.

## 2021-09-25 NOTE — Progress Notes (Signed)
CODE STROKE- PHARMACY COMMUNICATION   Time CODE STROKE called/page received: [phone]  Time response to CODE STROKE was made: Immediately  Time Stroke Kit retrieved from [facility]: N/A, risk > benefit for systemic thrombolytics per neurologist  Name of Provider contacted: Dr. Marlinda Mike 09/25/2021  12:24 PM

## 2021-09-25 NOTE — H&P (Addendum)
[place]   PATIENT NAME: Ernest [name]    MR#:  [id number]  DATE OF BIRTH:  1946/02/22  DATE OF ADMISSION:  09/25/2021  PRIMARY CARE PHYSICIAN: Patient, [name] (Inactive)   Patient is coming from: SNF.  REQUESTING/REFERRING PHYSICIAN: Duffy Bruce, MD  CHIEF COMPLAINT:   Chief Complaint  Patient presents with  . Code Stroke    HISTORY OF PRESENT ILLNESS:  Ernest [name] is a 75 y.o. male with medical history significant for COPD, systolic CHF, coronary artery disease, depression,, s/p right AKA, CVA with residual dysarthria end-stage renal disease on hemodialysis who was noted to have worsening slurred speech while having his dialysis session today with worsening confusion.  He was then brought to the ER and code stroke was activated.  SNF staff was called  And confirmed that the patient has chronic dysarthria.  He was at his baseline this morning.  The patient is a fairly poor historian due to his dysarthria.  He would answer some questions with yes or no.  ED Course: When patient came to the ER, blood pressure was 170/61 with otherwise normal vital signs.  Labs revealed VBG with pH 7.47 and HCO3 34.9 and CMP showed mild hyponatremia and hypochloremia.  BUN was 38 and creatinine 4.4 with calcium of 8.4 and albumin 3.2.  AST was 15 ALT 49 with a BNP of 1494 and high-sensitivity troponin I was 28.  CBC showed mild anemia.  Alcohol level was less than 10.    EKG as reviewed by me : EKG showed sinus rhythm with rate of 71 with LAD and left intrafascicular block with T wave inversion in 2 septally and anterolaterally as well as inferiorly Imaging: Portable chest x-ray showed the following: 1. Low lung volumes. Diffuse prominence of the perihilar interstitial markings with patchy left lung base opacity and volume loss, favor a combination of pulmonary edema and atelectasis. 2. Stable small to moderate loculated peripheral left pleural Effusion Noncontrasted CT scan  showed small age indeterminate lacunar infarct in the medial right thalamus with no associated hemorrhage.  It showed chronic small vessel and cerebellar ischemic disease that is stable since January.  It showed diffuse severe calcified atherosclerosis.  Dr. Cheral Marker was notified about the patient and will evaluate him.  The patient will be admitted to an observation cardiac telemetry bed for further evaluation and management. PAST MEDICAL HISTORY:   Past Medical History:  Diagnosis Date  . Abnormal stress test    s/p cath November 2013 with modest disease involving the ostial left main, proximal LAD, proximal RCA - do not appear to be hemodynamically signficant; mild LV dysfunction  . Anemia    low iron  . Anxiety   . Arthritis   . Bacteremia   . Chronic systolic CHF (congestive heart failure) ([place])   . COPD (chronic obstructive pulmonary disease) ([place])   . Coronary artery disease    a. per cath report 2013, b. 09/13/2017-CABG X4 & Mitral valve repair  . Depression   . DVT (deep venous thrombosis) ([facility])    arm - right- finished blood thinner- not sure when it  . Ejection fraction < 50%   . Erectile dysfunction    penile implant  . ESRD (end stage renal disease) ([place])    [facility] on a MWF schedule.     . Gout    Hx: of  . Hepatitis C   . History of seizures    last one 2018  .  Hyperlipidemia   . Hypertension   . Mitral valve disease    s/p Mitral repair 09/13/17  . Obesity   . Retinopathy   . Shortness of breath    Hx: of with exertion  . Stroke [facility])    "several" left side weakness, speech affected  . Type II or unspecified type diabetes mellitus without mention of complication, not stated as uncontrolled    adult onset    PAST SURGICAL HISTORY:   Past Surgical History:  Procedure Laterality Date  . A/V FISTULAGRAM N/A 10/04/2017   Procedure: A/V FISTULAGRAM;  Surgeon: Elam Dutch, MD;  Location: [place] [facility];  Service: Cardiovascular;  Laterality: N/A;   . A/V SHUNTOGRAM Left 07/12/2020   Procedure: A/V SHUNTOGRAM;  Surgeon: Serafina Mitchell, MD;  Location: [place] [facility];  Service: Cardiovascular;  Laterality: Left;  . AMPUTATION Right 07/08/2021   Procedure: AMPUTATION ABOVE KNEE;  Surgeon: Elmore Guise, MD;  Location: [facility];  Service: Vascular;  Laterality: Right;  . ANGIOPLASTY Left 09/24/2019   Procedure: Angioplasty Innominate Vien;  Surgeon: Serafina Mitchell, MD;  Location: [place];  Service: Vascular;  Laterality: Left;  . AV FISTULA PLACEMENT Left 01/13/2013   Procedure: INSERTION OF ARTERIOVENOUS (AV) GORE-TEX GRAFT ARM;  Surgeon: Angelia Mould, MD;  Location: [place];  Service: Vascular;  Laterality: Left;  . AV FISTULA PLACEMENT Left 05/05/2013   Procedure: INSERTION OF LEFT UPPER ARM  ARTERIOVENOUS GORTEX GRAFT;  Surgeon: Angelia Mould, MD;  Location: [facility] OR;  Service: Vascular;  Laterality: Left;  . AV FISTULA PLACEMENT Left 11/26/2019   Procedure: INSERTION OF ARTERIOVENOUS (AV) GORE-TEX GRAFT, left  ARM;  Surgeon: Serafina Mitchell, MD;  Location: [place];  Service: Vascular;  Laterality: Left;  . [place] REMOVAL Left 03/26/2013   Procedure: REMOVAL OF  NON INCORPORATED ARTERIOVENOUS GORETEX GRAFT ([place]) left arm * repair of  left brachial artery with vein patch angioplasty.;  Surgeon: Angelia Mould, MD;  Location: [place];  Service: Vascular;  Laterality: Left;  . CARDIAC CATHETERIZATION  August 26, 2012  . CATARACT EXTRACTION W/ INTRAOCULAR LENS  IMPLANT, BILATERAL    . COLONOSCOPY    . CORONARY ARTERY BYPASS GRAFT N/A 09/13/2017   Procedure: CORONARY ARTERY BYPASS GRAFTING (CABG) times four LIMA  to LAD, SVG sequentially to OM and RAMUS Intermediate and SVG to Acute Marginal;  Surgeon: Melrose Nakayama, MD;  Location: [place];  Service: Open Heart Surgery;  Laterality: N/A;  . DIALYSIS/PERMA CATHETER INSERTION N/A 02/28/2021   Procedure: DIALYSIS/PERMA CATHETER EXCHANGE;  Surgeon: Katha Cabal, MD;   Location: [place] CV LAB;  Service: Cardiovascular;  Laterality: N/A;  . EMBOLECTOMY Right 08/27/2013   Procedure: EMBOLECTOMY;  Surgeon: Mal Misty, MD;  Location: [place];  Service: Vascular;  Laterality: Right;  Thrombectomy of Radial and ulnar artery.  . ENDOVASCULAR STENT INSERTION Left 09/24/2019   Procedure: Endovascular Stent Graft Insertion, Arm Graft;  Surgeon: Serafina Mitchell, MD;  Location: [place];  Service: Vascular;  Laterality: Left;  . EYE SURGERY     laser.  and surgery for DM  . fistula     RUE and wrist  . FISTULOGRAM Left 09/24/2019   Procedure: SHUNTOGRAM OF ARTERIOVENOUS FISTULA LEFT ARM,;  Surgeon: Serafina Mitchell, MD;  Location: [place];  Service: Vascular;  Laterality: Left;  . INSERTION OF DIALYSIS CATHETER Right 01/01/2013   Procedure: INSERTION OF DIALYSIS CATHETER right  internal jugular;  Surgeon: Butch Penny  Trula Slade, MD;  Location: [place];  Service: Vascular;  Laterality: Right;  . INSERTION OF DIALYSIS CATHETER N/A 03/26/2013   Procedure: INSERTION OF DIALYSIS CATHETER Left internal jugular vein;  Surgeon: Angelia Mould, MD;  Location: [place];  Service: Vascular;  Laterality: N/A;  . INSERTION OF DIALYSIS CATHETER Right 11/26/2019   Procedure: INSERTION OF DIALYSIS CATHETER, right internal jugular;  Surgeon: Serafina Mitchell, MD;  Location: [place];  Service: Vascular;  Laterality: Right;  . IR THORACENTESIS ASP PLEURAL SPACE W/IMG GUIDE  09/30/2019  . LEFT HEART CATH AND CORONARY ANGIOGRAPHY N/A 09/09/2017   Procedure: LEFT HEART CATH AND CORONARY ANGIOGRAPHY;  Surgeon: Troy Sine, MD;  Location: [place] [facility] LAB;  Service: Cardiovascular;  Laterality: N/A;  . LIGATION OF ARTERIOVENOUS  FISTULA Right 12/30/2013   Procedure: REMOVAL OF SEGMENT OF GORTEX GRAFT AND FISTULA  AND REPAIR OF BRACHIAL ARTERY;  Surgeon: Mal Misty, MD;  Location: [place];  Service: Vascular;  Laterality: Right;  . LOWER EXTREMITY ANGIOGRAPHY N/A 06/28/2021   Procedure: Lower  Extremity Angiography;  Surgeon: Katha Cabal, MD;  Location: [place] CV LAB;  Service: Cardiovascular;  Laterality: N/A;  . MITRAL VALVE REPAIR N/A 09/13/2017   Procedure: MITRAL VALVE REPAIR (MVR);  Surgeon: Melrose Nakayama, MD;  Location: [place];  Service: Open Heart Surgery;  Laterality: N/A;  . MITRAL VALVE REPAIR (MV)/CORONARY ARTERY BYPASS GRAFTING (CABG)  09/13/2017  . PENILE PROSTHESIS IMPLANT     1997.. no card  . PERIPHERAL VASCULAR BALLOON ANGIOPLASTY Left 10/04/2017   Procedure: PERIPHERAL VASCULAR BALLOON ANGIOPLASTY;  Surgeon: Elam Dutch, MD;  Location: [place] CV LAB;  Service: Cardiovascular;  Laterality: Left;  AVF  . PERIPHERAL VASCULAR BALLOON ANGIOPLASTY Left 07/12/2020   Procedure: PERIPHERAL VASCULAR BALLOON ANGIOPLASTY;  Surgeon: Serafina Mitchell, MD;  Location: [place] CV LAB;  Service: Cardiovascular;  Laterality: Left;  ARM SHUNTOGRAM  . REVISION OF ARTERIOVENOUS GORETEX GRAFT Left 09/24/2019   Procedure: REVISION OF ARTERIOVENOUS GORETEX GRAFT LEFT ARM;  Surgeon: Serafina Mitchell, MD;  Location: [place];  Service: Vascular;  Laterality: Left;  . TEE WITHOUT CARDIOVERSION N/A 06/11/2013   Procedure: TRANSESOPHAGEAL ECHOCARDIOGRAM (TEE);  Surgeon: Larey Dresser, MD;  [facility] ENDOSCOPY;  Service: Cardiovascular;  Laterality: N/A;  . TEE WITHOUT CARDIOVERSION N/A 09/13/2017   Procedure: TRANSESOPHAGEAL ECHOCARDIOGRAM (TEE);  Surgeon: Melrose Nakayama, MD;  Location: [place];  Service: Open Heart Surgery;  Laterality: N/A;  . THROMBECTOMY W/ EMBOLECTOMY Left 03/26/2013   Procedure: Attempted thrombectomy of left arm arteriovenous goretex graft.;  Surgeon: Angelia Mould, MD;  Location: [place];  Service: Vascular;  Laterality: Left;  . ULTRASOUND GUIDANCE FOR VASCULAR ACCESS Bilateral 09/24/2019   Procedure: Ultrasound Guidance For Vascular Access, Right Femoral Vein and Left Arm Graft;  Surgeon: Serafina Mitchell, MD;  Location: [facility]  OR;  Service: Vascular;  Laterality: Bilateral;  . VENOGRAM N/A 04/07/2013   Procedure: VENOGRAM;  Surgeon: Serafina Mitchell, MD;  Location: [facility] CATH LAB;  Service: Cardiovascular;  Laterality: N/A;    SOCIAL HISTORY:   Social History   Tobacco Use  . Smoking status: Former    Years: 7.00    Types: Cigarettes    Quit date: 06/10/1973    Years since quitting: 48.3  . Smokeless tobacco: Never  . Tobacco comments:    Quit in 1974.  Substance Use Topics  . Alcohol use: No    Alcohol/week: 0.0 standard drinks  FAMILY HISTORY:   Family History  Problem Relation Age of Onset  . Heart disease Father   . Diabetes Sister   . Diabetes Brother   . Diabetes Son     DRUG ALLERGIES:  [name]  REVIEW OF SYSTEMS:   ROS As per history of present illness. All pertinent systems were reviewed above. Constitutional, HEENT, cardiovascular, respiratory, GI, GU, musculoskeletal, neuro, psychiatric, endocrine, integumentary and hematologic systems were reviewed and are otherwise negative/unremarkable except for positive findings mentioned above in the HPI.   MEDICATIONS AT HOME:   Prior to Admission medications   Medication Sig Start Date End Date Taking? Authorizing Provider  acetaminophen (TYLENOL) 500 MG tablet Take 1,000 mg by mouth every 6 (six) hours as needed for mild pain.    [provider]  allopurinol (ZYLOPRIM) 100 MG tablet Take 100 mg by mouth daily.    [provider]  Amino Acids-Protein Hydrolys (FEEDING SUPPLEMENT, PRO-STAT 64,) LIQD Take 30 mLs by mouth 3 (three) times daily with meals.    [provider]  amiodarone (PACERONE) 200 MG tablet Take 200 mg by mouth daily.    [provider]  aspirin EC 81 MG tablet Take 1 tablet (81 mg total) by mouth daily. 11/29/15   Rivet, Sindy [place], MD  atorvastatin (LIPITOR) 40 MG tablet Take 40 mg by mouth at bedtime.    [provider]  docusate sodium (COLACE) 100 MG capsule Take 100  mg by mouth daily.    [provider]  levETIRAcetam (KEPPRA) 500 MG tablet Take 500 mg by mouth See admin instructions. MWF only: 500mg  TID STTS only: 500mg  BID    [provider]  midodrine (PROAMATINE) 10 MG tablet Take 1 tablet (10 mg total) by mouth 4 (four) times daily. 07/20/21   Swayze, Ava, DO  mirtazapine (REMERON) 15 MG tablet Take 1 tablet (15 mg total) by mouth at bedtime. 07/20/21   Swayze, Ava, DO  Nutritional Supplements (FEEDING SUPPLEMENT, NEPRO CARB STEADY,) LIQD Take 237 mLs by mouth 2 (two) times daily between meals. 10/07/17   Gold, Wayne E, [name]  pantoprazole (PROTONIX) 40 MG tablet Take 1 tablet (40 mg total) by mouth daily. 07/21/21   Swayze, Ava, DO  polyethylene glycol (MIRALAX / GLYCOLAX) 17 g packet Take 17 g by mouth daily.    [provider]  senna-docusate (SENOKOT-S) 8.6-50 MG tablet Take 1 tablet by mouth at bedtime.    [provider]  sertraline (ZOLOFT) 25 MG tablet Take 25 mg by mouth at bedtime.    [provider]  sevelamer carbonate (RENVELA) 800 MG tablet Take 800 mg by mouth 3 (three) times daily with meals.    [provider]      VITAL SIGNS:  Blood pressure (!) 171/89, pulse 67, temperature 98.4 F (36.9 C), temperature source Oral, resp. rate 13, SpO2 97 %.  PHYSICAL EXAMINATION:  Physical Exam  GENERAL:  75 y.o.-year-old African-American male patient lying in the bed with no acute distress.  EYES: Pupils equal, round, reactive to light and accommodation. No scleral icterus. Extraocular muscles intact.  HEENT: Head atraumatic, normocephalic. Oropharynx and nasopharynx clear.  NECK:  Supple, no jugular venous distention. No thyroid enlargement, no tenderness.  LUNGS: Slightly diminished bibasilar breath sounds with bibasal rales.. No use of accessory muscles of respiration.  CARDIOVASCULAR: Regular rate and rhythm, S1, S2 normal. No murmurs, rubs, or gallops.  ABDOMEN: Soft, nondistended,  nontender. Bowel sounds present. No organomegaly or mass.  EXTREMITIES:  No pedal edema, cyanosis, or clubbing.  He has right above-knee amputation NEUROLOGIC: He has significant slurred speech and expressive dysphasi, cranial nerves II through XII are otherwise intact. Muscle strength 5/5 in all extremities. Sensation intact. Gait not checked.  PSYCHIATRIC: The patient is alert and oriented x 3.  Normal affect and good eye contact. SKIN: Left distal digit big and second toe black discoloration .    LABORATORY PANEL:   CBC Recent Labs  Lab 09/25/21 1216  WBC 7.9  HGB 11.5*  HCT 34.6*  PLT 186   ------------------------------------------------------------------------------------------------------------------  Chemistries  Recent Labs  Lab 09/25/21 1216  NA 134*  K 4.3  CL 96*  CO2 29  GLUCOSE 136*  BUN 38*  CREATININE 4.40*  CALCIUM 8.4*  AST 50*  ALT 49*  ALKPHOS 84  BILITOT 0.7   ------------------------------------------------------------------------------------------------------------------  Cardiac Enzymes No results for input(s): TROPONINI in the last 168 hours. ------------------------------------------------------------------------------------------------------------------  RADIOLOGY:  DG Chest Portable 1 View  Result Date: 09/25/2021 CLINICAL DATA:  Hypoxia, dyspnea, dialysis patient EXAM: PORTABLE CHEST 1 VIEW COMPARISON:  06/27/2021 chest radiograph. FINDINGS: Mitral valve annuloplasty ring in place. Intact sternotomy wires. Low lung volumes. Inferior approach central venous catheter terminates at the inferior cavoatrial junction. Stable cardiomediastinal silhouette with normal heart size. No pneumothorax. Small to moderate loculated peripheral left pleural effusion, unchanged. No right pleural effusion. Diffuse prominence of the perihilar interstitial markings with patchy left lung base opacity and volume loss. Chronic calcified left thyroid nodule,  unchanged. IMPRESSION: 1. Low lung volumes. Diffuse prominence of the perihilar interstitial markings with patchy left lung base opacity and volume loss, favor a combination of pulmonary edema and atelectasis. 2. Stable small to moderate loculated peripheral left pleural effusion. Electronically Signed   By: Ilona Sorrel M.D.   On: 09/25/2021 12:34   CT HEAD CODE STROKE WO CONTRAST  Addendum Date: 09/25/2021   ADDENDUM REPORT: 09/25/2021 12:05 ADDENDUM: Study discussed by telephone with Dr. Ellender Hose on 09/25/2021 at 12:04 . Electronically Signed   By: Genevie Ann M.D.   On: 09/25/2021 12:05   Result Date: 09/25/2021 CLINICAL DATA:  Code stroke. 75 year old male dialysis patient with slurred speech, neurologic deficit. EXAM: CT HEAD WITHOUT CONTRAST TECHNIQUE: Contiguous axial images were obtained from the base of the skull through the vertex without intravenous contrast. COMPARISON:  Head CT 11/14/2020 and earlier. FINDINGS: Brain: Stable cerebral volume. No midline shift, mass effect, or evidence of intracranial mass lesion. No ventriculomegaly. No acute intracranial hemorrhage identified. Stable patchy bilateral chronic appearing cerebellar infarcts. Heterogeneity in the brainstem has not definitely changed. A small round hypodensity in the medial right thalamus is more conspicuous since January but age indeterminate. Extensive vascular calcifications in the bilateral cerebral white matter and deep gray nuclei. No convincing acute cortically based infarct. Gray-white matter differentiation elsewhere appears stable. Vascular: Extensive Calcified atherosclerosis at the skull base. No suspicious intracranial vascular hyperdensity. Skull: No acute osseous abnormality identified. Sinuses/Orbits: Improved chronic right sphenoid sinusitis. Other Visualized paranasal sinuses and mastoids are stable and well aerated. Other: Diffuse calcified scalp vessel atherosclerosis. Similar intraorbital vessel calcification. ASPECTS  [facility] Stroke Program Early CT Score) Total score (0-10 with 10 being normal): 10 IMPRESSION: 1. Small age indeterminate lacunar infarct in the medial right thalamus. No associated hemorrhage or mass effect. 2. Otherwise stable CT appearance of chronic small vessel and cerebellar ischemic disease since January. ASPECTS 10. 3. Diffuse severe calcified atherosclerosis. Electronically Signed: By: Genevie Ann M.D. On: 09/25/2021 11:56  IMPRESSION AND PLAN:  Principal Problem:   TIA (transient ischemic attack)  1.  TIA, rule out evolving CVA with worsening dysarthria and altered mental status with confusion. - The patient will be admitted on observation cardiac telemetry) - We will follow neurochecks every 4 hours for 24 hours. - Neurology consult will be obtained. - Dr. Cheral Marker was notified and is aware about the patient. - PT/ST and OT consults will be obtained. - The patient will be placed on aspirin and statin therapy. - Fasting lipids will be checked in AM. - We will obtain a brain MRI without contrast as well as bilateral carotid Doppler. - The patient had a recent 2D echo in September which showed an EF of 50 to 65% with mild left and right atrial dilatation, trivial mitral valve regurgitation and moderate LVH as well as grade 2 diastolic dysfunction.  2.  End-stage renal disease with on hemodialysis with fluid overload/pulmonary edema that could be related to incomplete session today and mild acute on chronic diastolic CHF. - Nephrology consult to be obtained. - I notified Dr. Candiss Norse about the patient. - We will continue his Renvela. - We will utilize IV Lasix for now mainly for pulmonary congestion.  3.  Paroxysmal atrial fibrillation. - We will continue amiodarone.  4.  Left big and second toe discoloration concerning for ischemia and necrosis/dry gangrene. - Podiatry consult will be obtained.  5.  Dyslipidemia. - We will continue statin therapy and check fasting lipids.  6.   Seizure disorder. - We will continue Keppra.  7.  Gout. - We will continue allopurinol.  8.  Depression. - We will continue Zoloft and Remeron.  DVT prophylaxis: Lovenox, renally adjusted. Code Status: The patient is DNR/DNI. Family Communication:  The plan of care was discussed in details with the patient (and family). I answered all questions. The patient agreed to proceed with the above mentioned plan. Further management will depend upon hospital course. Disposition Plan: Back to previous home environment Consults called: Neurology and nephrology All the records are reviewed and case discussed with ED provider.  Status is: Inpatient   Remains inpatient appropriate because:Ongoing diagnostic testing needed not appropriate for outpatient work up, Unsafe d/c plan, IV treatments appropriate due to intensity of illness or inability to take PO, and Inpatient level of care appropriate due to severity of illness   Dispo: The patient is from: SNF              Anticipated d/c is to: SNF              Patient currently is not medically stable to d/c.              Difficult to place patient: No  TOTAL TIME TAKING CARE OF THIS PATIENT: 55 minutes.     Christel Mormon M.D on 09/25/2021 at 2:30 PM  [facility]   From 7 PM-7 AM, contact night-coverage [email or web address]  CC: Primary care physician; Patient, [name] (Inactive)

## 2021-09-26 ENCOUNTER — Encounter: Payer: Self-pay | Admitting: Family Medicine

## 2021-09-26 ENCOUNTER — Other Ambulatory Visit: Payer: Self-pay

## 2021-09-26 DIAGNOSIS — L8990 Pressure ulcer of unspecified site, unspecified stage: Secondary | ICD-10-CM

## 2021-09-26 DIAGNOSIS — G9340 Encephalopathy, unspecified: Secondary | ICD-10-CM

## 2021-09-26 LAB — BASIC METABOLIC PANEL
Anion gap: 8 (ref 5–15)
BUN: 45 mg/dL — ABNORMAL HIGH (ref 8–23)
CO2: 31 mmol/L (ref 22–32)
Calcium: 8.1 mg/dL — ABNORMAL LOW (ref 8.9–10.3)
Chloride: 97 mmol/L — ABNORMAL LOW (ref 98–111)
Creatinine, Ser: 5.25 mg/dL — ABNORMAL HIGH (ref 0.61–1.24)
GFR, Estimated: 11 mL/min — ABNORMAL LOW (ref >60)
Glucose, Bld: 75 mg/dL (ref 70–99)
Potassium: 4.9 mmol/L (ref 3.5–5.1)
Sodium: 136 mmol/L (ref 135–145)

## 2021-09-26 LAB — CBC
HCT: 33.1 % — ABNORMAL LOW (ref 39.0–52.0)
Hemoglobin: 10.9 g/dL — ABNORMAL LOW (ref 13.0–17.0)
MCH: 27.9 pg (ref 26.0–34.0)
MCHC: 32.9 g/dL (ref 30.0–36.0)
MCV: 84.7 fL (ref 80.0–100.0)
Platelets: 180 10*3/uL (ref 150–400)
RBC: 3.91 MIL/uL — ABNORMAL LOW (ref 4.22–5.81)
RDW: 16.4 % — ABNORMAL HIGH (ref 11.5–15.5)
WBC: 6.4 10*3/uL (ref 4.0–10.5)
nRBC: 0 % (ref 0.0–0.2)

## 2021-09-26 LAB — LIPID PANEL
Cholesterol: 122 mg/dL (ref 0–200)
HDL: 45 mg/dL (ref >40)
LDL Cholesterol: 67 mg/dL (ref 0–99)
Total CHOL/HDL Ratio: 2.7 RATIO
Triglycerides: 48 mg/dL (ref <150)
VLDL: 10 mg/dL (ref 0–40)

## 2021-09-26 LAB — LACTIC ACID, PLASMA: Lactic Acid, Venous: 0.7 mmol/L (ref 0.5–1.9)

## 2021-09-26 LAB — HEPATITIS B SURFACE ANTIGEN: Hepatitis B Surface Ag: NONREACTIVE

## 2021-09-26 LAB — VITAMIN B12: Vitamin B-12: 552 pg/mL (ref 180–914)

## 2021-09-26 LAB — PHOSPHORUS: Phosphorus: 3.9 mg/dL (ref 2.5–4.6)

## 2021-09-26 MED ORDER — HEPARIN SODIUM (PORCINE) 1000 UNIT/ML DIALYSIS: 1000.0000 [IU] | INTRAMUSCULAR | Status: DC | PRN

## 2021-09-26 MED ORDER — LIDOCAINE-PRILOCAINE 2.5-2.5 % EX CREA: 1.0000 "application " | TOPICAL_CREAM | CUTANEOUS | Status: DC | PRN

## 2021-09-26 MED ORDER — LEVETIRACETAM IN NACL 500 MG/100ML IV SOLN
500.0000 mg | Freq: Two times a day (BID) | INTRAVENOUS | Status: DC
  Administered 2021-09-26 (×2): 500 mg via INTRAVENOUS
  Filled 2021-09-26 (×2): qty 100

## 2021-09-26 MED ORDER — RENA-VITE PO TABS
1.0000 | ORAL_TABLET | Freq: Every day | ORAL | Status: DC
  Administered 2021-09-27 – 2021-09-28 (×3): 1 via ORAL
  Filled 2021-09-26 (×5): qty 1

## 2021-09-26 MED ORDER — PENTAFLUOROPROP-TETRAFLUOROETH EX AERO: 1.0000 "application " | INHALATION_SPRAY | CUTANEOUS | Status: DC | PRN

## 2021-09-26 MED ORDER — LEVETIRACETAM 500 MG PO TABS
500.0000 mg | ORAL_TABLET | Freq: Two times a day (BID) | ORAL | Status: DC
  Administered 2021-09-26 – 2021-09-29 (×6): 500 mg via ORAL
  Filled 2021-09-26 (×6): qty 1

## 2021-09-26 MED ORDER — HEPARIN SODIUM (PORCINE) 5000 UNIT/ML IJ SOLN
5000.0000 [IU] | Freq: Two times a day (BID) | INTRAMUSCULAR | Status: DC
  Administered 2021-09-26 – 2021-09-28 (×6): 5000 [IU] via SUBCUTANEOUS
  Filled 2021-09-26 (×7): qty 1

## 2021-09-26 MED ORDER — ALTEPLASE 2 MG IJ SOLR: 2.0000 mg | Freq: Once | INTRAMUSCULAR | Status: DC | PRN

## 2021-09-26 MED ORDER — SODIUM CHLORIDE 0.9 % IV SOLN: 100.0000 mL | INTRAVENOUS | Status: DC | PRN

## 2021-09-26 MED ORDER — LIDOCAINE HCL (PF) 1 % IJ SOLN: 5.0000 mL | INTRAMUSCULAR | Status: DC | PRN

## 2021-09-26 MED ORDER — CHLORHEXIDINE GLUCONATE CLOTH 2 % EX PADS
6.0000 | MEDICATED_PAD | Freq: Every day | CUTANEOUS | Status: DC
  Administered 2021-09-26 – 2021-09-29 (×4): 6 via TOPICAL

## 2021-09-26 NOTE — Evaluation (Signed)
Physical Therapy Evaluation Patient Details Name: Ernest [name] MRN: [id number] DOB: 1945/11/19 Today's Date: 09/26/2021  History of Present Illness  Ernest [name] is an 75 y.o. male with a PMHx of CAD s/p CABG, iron deficiency anemia, CHF, COPD, DVT, ESRD on HD, gout, hepatitis C, seizures (last seizure in 2018), HLD, HTN, mitral valve repair, retinopathy, bilateral cataract surgeries, several strokes with deficits including left sided weakness and sphasia, DM2 and right AKA 3 months ago who presented via EMS from dialysis with acute onset of worsened speech impairment.   Clinical Impression  Pt admitted with above diagnosis. Pt in bed agreeable to PT/OT co-eval. Performed due to multi system involvement and for pt/therapist safety. Pt A&O to person and place (place with cuing). Unable to accurately report he came from [facility] but states he is dependent with mobility requiring 2 person assist for all tasks specifically to sit EOB. Able to follow single step commands with increased time throughout session. Required mod to max multimodal cuing to transition to seated EOB for UE/LE sequencing ultimately requiring maxA at chuck pad to sit EOB. Difficulty maintaining static sitting balance requiring 2  people for guarding while additional therapist assess's pt for eval. Despite BUE support on bed, pt requires frequent modA+1 at shoulder for maintaining static sitting balance. Pt becomes fatigued quickly after ~5 min seated EoB with more frequent post leaning requesting to return to supine. Requires +2MaxA to transition to supine and scoot up in bed. HR maintained in mid 70's during seated bout. Pt left in room for supine ADL's with OT. Pt displaying deficits in strength and functional mobility limiting safe ability to transfer and perform ADL tasks without multi person assist. Will benefit from STR to reduce caregiver burden and improve independence with ADL's. Pt currently with functional  limitations due to the deficits listed below (see PT Problem List). Pt will benefit from skilled PT to increase their independence and safety with mobility to allow discharge to the venue listed below.     Recommendations for follow up therapy are one component of a multi-disciplinary discharge planning process, led by the attending physician.  Recommendations may be updated based on patient status, additional functional criteria and insurance authorization.  Follow Up Recommendations Skilled nursing-short term rehab (<3 hours/day)    Assistance Recommended at Discharge Frequent or constant Supervision/Assistance  Functional Status Assessment Patient has had a recent decline in their functional status and/or demonstrates limited ability to make significant improvements in function in a reasonable and predictable amount of time  Equipment Recommendations       Recommendations for Other Services       Precautions / Restrictions Precautions Precautions: Fall Restrictions Other Position/Activity Restrictions: R AKA; does not ambulate      Mobility  Bed Mobility Overal bed mobility: Needs Assistance Bed Mobility: Supine to Sit;Sit to Supine     Supine to sit: HOB elevated;+2 for physical assistance Sit to supine: +2 for physical assistance   General bed mobility comments: maxA+2 to scoot up in bed Patient Response: Cooperative  Transfers                   General transfer comment: deferred due to difficulty with static sitting    Ambulation/Gait               General Gait Details: Does not ambualte at baseline  Science writer    Modified Rankin (  Stroke Patients Only)       Balance Overall balance assessment: Needs assistance Sitting-balance support: Bilateral upper extremity supported;Feet supported;Feet unsupported Sitting balance-Leahy Scale: Poor Sitting balance - Comments: requires max cuing and modA at shoulders to  maintain static sitting balance. Postural control: Posterior lean;Left lateral lean;Right lateral lean     Standing balance comment: deferred                             Pertinent Vitals/Pain Pain Assessment: No/denies pain    Home Living Family/patient expects to be discharged to:: Skilled nursing facility                   Additional Comments: [facility]    Prior Function Prior Level of Function : Needs assist  Cognitive Assist : Mobility (cognitive);ADLs (cognitive) Mobility (Cognitive): Intermittent cues ADLs (Cognitive): Intermittent cues Physical Assist : Mobility (physical) Mobility (physical): Bed mobility;Transfers   Mobility Comments: Reports needing 2 person assist with bed mobility and sitting EOB ADLs Comments: Reports needing 2 person assist. Per EMR pt dependent for all ADL's     Hand Dominance   Dominant Hand: Right    Extremity/Trunk Assessment   Upper Extremity Assessment Upper Extremity Assessment: Generalized weakness    Lower Extremity Assessment Lower Extremity Assessment: RLE deficits/detail;Generalized weakness RLE Deficits / Details: R AKA       Communication   Communication: HOH;Expressive difficulties  Cognition Arousal/Alertness: Awake/alert Behavior During Therapy: Flat affect Overall Cognitive Status: No family/caregiver present to determine baseline cognitive functioning                                 General Comments: Alert and oriented to person and place (with cuing for place, knows he is in [place] and hospital)        General Comments General comments (skin integrity, edema, etc.): HR trending in mid 70's with activity    Exercises General Exercises - Lower Extremity Long Arc Quad: AROM;Left;Seated;10 reps Other Exercises Other Exercises: Role of PT in acute setting, use of bed functions to assist in bed mobility.   Assessment/Plan    PT Assessment Patient needs continued PT  services  PT Problem List Decreased strength;Decreased cognition;Decreased activity tolerance;Decreased balance;Decreased safety awareness;Decreased mobility       PT Treatment Interventions DME instruction;Therapeutic exercise;Gait training;Balance training;Neuromuscular re-education;Functional mobility training;Therapeutic activities;Patient/family education    PT Goals (Current goals can be found in the Care Plan section)  Acute Rehab PT Goals PT Goal Formulation: Patient unable to participate in goal setting Time For Goal Achievement: 10/10/21 Potential to Achieve Goals: Fair    Frequency Min 2X/week   Barriers to discharge        Co-evaluation PT/OT/SLP Co-Evaluation/Treatment: Yes Reason for Co-Treatment: Complexity of the patient's impairments (multi-system involvement);Necessary to address cognition/behavior during functional activity;For patient/therapist safety;To address functional/ADL transfers PT goals addressed during session: Mobility/safety with mobility;Balance OT goals addressed during session: ADL's and self-care;Strengthening/ROM       AM-PAC PT "6 Clicks" Mobility  Outcome Measure Help needed turning from your back to your side while in a flat bed without using bedrails?: Total Help needed moving from lying on your back to sitting on the side of a flat bed without using bedrails?: Total Help needed moving to and from a bed to a chair (including a wheelchair)?: Total Help needed standing up from a chair using  your arms (e.g., wheelchair or bedside chair)?: Total Help needed to walk in hospital room?: Total Help needed climbing 3-5 steps with a railing? : Total 6 Click Score: 6    End of Session Equipment Utilized During Treatment: Oxygen Activity Tolerance: Patient limited by fatigue Patient left: in bed;with call bell/phone within reach;with bed alarm set Nurse Communication: Mobility status PT Visit Diagnosis: Other abnormalities of gait and mobility  (R26.89);Muscle weakness (generalized) (M62.81)    Time: 4175-3010 PT Time Calculation (min) (ACUTE ONLY): 14 min   Charges:   PT Evaluation $PT Eval High Complexity: 1 High         Baylynn Shifflett M. Fairly IV, PT, DPT Physical Therapist- [facility]  09/26/2021, 10:27 AM

## 2021-09-26 NOTE — Progress Notes (Addendum)
MRI brain reveals no acute intracranial process. Numerous chronic abnormalities are appreciated, which likely explain at least a portion of the patient's baseline impairments. These findings include severe chronic small vessel ischemic disease, lacunar infarcts in the bilateral corona radiata, thalami, and cerebellar hemispheres, and multiple punctate foci of susceptibility, which may represent sequela of hypertensive microhemorrhages. Images personally reviewed.   EEG is pending.   Addendum: EEG is within normal limits.   Electronically signed: Dr. Kerney Elbe

## 2021-09-26 NOTE — Progress Notes (Signed)
Eeg done 

## 2021-09-26 NOTE — Evaluation (Signed)
Occupational Therapy Evaluation Patient Details Name: Ernest [name] MRN: [id number] DOB: 1946/02/28 Today's Date: 09/26/2021   History of Present Illness 75 y.o. male with a PMHx of CAD s/p CABG, iron deficiency anemia, CHF, COPD, DVT, ESRD on HD, gout, hepatitis C, seizures (last seizure in 2018), HLD, HTN, mitral valve repair, retinopathy, bilateral cataract surgeries, several strokes with deficits including left sided weakness and aphasia, DM2 and right AKA (3 months ago) who presented via EMS from dialysis with acute onset of worsened speech impairment. Per report, the patient 'almost' finished his dialysis treatment this morning, when dialysis staff noted that his speech had become worse than baseline; it seemed to be slurred and he was also more confused than his baseline. MRI brain reveals no acute intracranial process. Numerous chronic abnormalities are appreciated, which include severe chronic small vessel ischemic disease, lacunar infarcts in the bilateral corona radiata, thalami, and cerebellar hemispheres   Clinical Impression   Pt seen for OT/PT co-evaluation this date. Upon arrival to room, pt asleep however easily awoken and agreeable to OT/PT session. Pt alert and oriented to person and place (with cuing for place, knows he is in [place] and hospital). Pt unable to report he was at [facility] prior to admission and was unable to state PLOF for ADLs, however reports requiring 2-person assist for mobility. OT/PT attempted to call [facility] and pt's daughter to gather most up-to-date PLOF, however unable to contact someone; will follow up at later date as able. Pt endorses feeling weaker than usual. Pt currently requires SET-UP assist for bed-level grooming tasks, MAX A for rolling, MAX A for bed-level peri-care, and MOD A+2 for supine<>sit transfers d/t poor balance and strength. While sitting EOB, pt required MOD A for static sitting balance. Pt would benefit from additional  skilled OT services to address the impairments listed above and to maximize return to PLOF. Upon discharge, recommend SNF.     Recommendations for follow up therapy are one component of a multi-disciplinary discharge planning process, led by the attending physician.  Recommendations may be updated based on patient status, additional functional criteria and insurance authorization.   Follow Up Recommendations  Skilled nursing-short term rehab (<3 hours/day)    Assistance Recommended at Discharge Frequent or constant Supervision/Assistance  Functional Status Assessment  Patient has had a recent decline in their functional status and demonstrates the ability to make significant improvements in function in a reasonable and predictable amount of time.  Equipment Recommendations  Other (comment) (defer to next venue of care)       Precautions / Restrictions Precautions Precautions: Fall Restrictions Weight Bearing Restrictions: No Other Position/Activity Restrictions: R AKA; does not ambulate      Mobility Bed Mobility Overal bed mobility: Needs Assistance Bed Mobility: Rolling;Supine to Sit;Sit to Supine Rolling: Mod assist   Supine to sit: Mod assist;+2 for physical assistance;HOB elevated Sit to supine: Mod assist;+2 for physical assistance   General bed mobility comments: maxA+2 to scoot up in bed    Transfers                   General transfer comment: deferred due to difficulty with static sitting      Balance Overall balance assessment: Needs assistance Sitting-balance support: Bilateral upper extremity supported;Feet supported;Feet unsupported Sitting balance-Leahy Scale: Poor Sitting balance - Comments: requires max cuing and modA at shoulders to maintain static sitting balance. Postural control: Posterior lean;Left lateral lean;Right lateral lean     Standing balance comment:  deferred                           ADL either performed or assessed  with clinical judgement   ADL Overall ADL's : Needs assistance/impaired     Grooming: Wash/dry face;Supervision/safety;Set up;Bed level                       Toileting- Clothing Manipulation and Hygiene: Maximal assistance;Bed level               Vision Ability to See in Adequate Light: 0 Adequate Patient Visual Report: No change from baseline              Pertinent Vitals/Pain Pain Assessment: No/denies pain     Hand Dominance Right   Extremity/Trunk Assessment Upper Extremity Assessment Upper Extremity Assessment: Generalized weakness (grossly at least 3/5 in all movements. AROM of shoulder flexion 0-100 degrees)   Lower Extremity Assessment Lower Extremity Assessment: Generalized weakness;RLE deficits/detail RLE Deficits / Details: R AKA       Communication Communication Communication: HOH;Expressive difficulties   Cognition Arousal/Alertness: Awake/alert Behavior During Therapy: Flat affect Overall Cognitive Status: Difficult to assess                                 General Comments: Alert and oriented to person and place (with cuing for place, knows he is in [place] and hospital)     General Comments  HR trending in mid 70's with activity    Exercises General Exercises - Lower Extremity Long Arc Quad: AROM;Left;Seated;10 reps Other Exercises Other Exercises: Role of PT in acute setting, use of bed functions to assist in bed mobility.        Home Living Family/patient expects to be discharged to:: Skilled nursing facility                                 Additional Comments: [facility]      Prior Functioning/Environment Prior Level of Function : Needs assist  Cognitive Assist : Mobility (cognitive);ADLs (cognitive) Mobility (Cognitive): Intermittent cues ADLs (Cognitive): Intermittent cues Physical Assist : Mobility (physical) Mobility (physical): Bed mobility;Transfers   Mobility Comments: Pt  reports needing 2 person assist with bed mobility and sitting EOB ADLs Comments: Pt unable to provide up-to-date PLOF for ADLs. OT/PT attempted to call [facility] and daughter, however unable to reach contact. Will follow up as able        OT Problem List: Decreased strength;Decreased activity tolerance      OT Treatment/Interventions: Self-care/ADL training;Therapeutic exercise;DME and/or AE instruction;Therapeutic activities;Patient/family education;Balance training    OT Goals(Current goals can be found in the care plan section) Acute Rehab OT Goals Patient Stated Goal: to get stronger OT Goal Formulation: With patient Time For Goal Achievement: 10/10/21 Potential to Achieve Goals: Good ADL Goals Pt Will Perform Grooming: with set-up;with supervision;sitting Pt Will Perform Upper Body Dressing: with set-up;with supervision;sitting Pt Will Perform Lower Body Dressing: with min assist;bed level  OT Frequency: Min 2X/week           Co-evaluation PT/OT/SLP Co-Evaluation/Treatment: Yes Reason for Co-Treatment: Complexity of the patient's impairments (multi-system involvement);For patient/therapist safety;To address functional/ADL transfers PT goals addressed during session: Mobility/safety with mobility;Balance OT goals addressed during session: ADL's and self-care;Strengthening/ROM      AM-PAC OT "  6 Clicks" Daily Activity     Outcome Measure Help from another person eating meals?: None Help from another person taking care of personal grooming?: A Little Help from another person toileting, which includes using toliet, bedpan, or urinal?: A Lot Help from another person bathing (including washing, rinsing, drying)?: A Lot Help from another person to put on and taking off regular upper body clothing?: A Little Help from another person to put on and taking off regular lower body clothing?: A Lot 6 Click Score: 16   End of Session Nurse Communication: Mobility  status  Activity Tolerance: Patient tolerated treatment well Patient left: in bed;with call bell/phone within reach;with bed alarm set  OT Visit Diagnosis: Muscle weakness (generalized) (M62.81)                Time: 4196-2229 OT Time Calculation (min): 31 min Charges:  OT General Charges $OT Visit: 1 Visit OT Evaluation $OT Eval Moderate Complexity: 1 Mod OT Treatments $Self Care/Home Management : 8-22 mins  Fredirick Maudlin, OTR/L [place]

## 2021-09-26 NOTE — Progress Notes (Addendum)
PROGRESS NOTE    Ernest [name]  XIP:[id number] DOB: August 20, 1946 DOA: 09/25/2021 PCP: Patient, [name] (Inactive)    Brief Narrative:  Ernest [name] is a 75 y.o. male with medical history significant for COPD, systolic CHF, coronary artery disease, depression,, s/p right AKA, CVA with residual dysarthria end-stage renal disease on hemodialysis who was noted to have worsening slurred speech while having his dialysis session today with worsening confusion.  He was then brought to the ER and code stroke was activated.  SNF staff was called  And confirmed that the patient has chronic dysarthria.  He was at his baseline this morning.  The patient is a fairly poor historian due to his dysarthria.  He would answer some questions with yes or no.  12/13 this am pt at baseline mentation. Answering questions appropriately. Spoke to daughter on phone and reports pt wants to stop dialysis and join his wife in heaven. She passed away 2 months ago. Discussed code status with the daughter and he is DNR.  Discussed with daughter about consulting palliative care and she is agreeable.  Palliative care consulted to discuss goals of care  Also overnight patient's heart rate was 40s to 60s, blood pressure 99/56.  Consultants:  Neurology nephrology  Procedures: EEG normal  Antimicrobials:      Subjective: Patient denies any shortness of breath, chest pain, dizziness, or any other complaint  Objective: Vitals:   09/25/21 2319 09/26/21 0339 09/26/21 0356 09/26/21 0726  BP:  (!) 151/74  (!) 142/67  Pulse:    (!) 57  Resp:  18  17  Temp: 97.8 F (36.6 C) 98 F (36.7 C)  97.9 F (36.6 C)  TempSrc:  Oral  Axillary  SpO2:  96%  98%  Weight: 73.8 kg  73.8 kg     Intake/Output Summary (Last 24 hours) at 09/26/2021 0843 Last data filed at 09/26/2021 0300 Gross per 24 hour  Intake 150.22 ml  Output 75 ml  Net 75.22 ml   Filed Weights   09/25/21 2319 09/26/21 0356  Weight: 73.8 kg 73.8 kg     Examination:  General exam: Appears calm and comfortable  Respiratory system: Clear to auscultation. Respiratory effort normal. Cardiovascular system: S1 & S2 heard, RRR. No JVD, murmurs, rubs, gallops or clicks.  Gastrointestinal system: Abdomen is nondistended, soft and nontender. . Normal bowel sounds heard. Central nervous system: Alert and oriented x3.  No facial droop.  Strength x3 `nml.  Rest is grossly intact Extremities: no edema. Rt aka Psychiatry:  Mood & affect appropriate.     Data Reviewed: I have personally reviewed following labs and imaging studies  CBC: Recent Labs  Lab 09/25/21 1216  WBC 7.9  NEUTROABS 5.2  HGB 11.5*  HCT 34.6*  MCV 84.6  PLT 673   Basic Metabolic Panel: Recent Labs  Lab 09/25/21 1216 09/26/21 0613  NA 134* 136  K 4.3 4.9  CL 96* 97*  CO2 29 31  GLUCOSE 136* 75  BUN 38* 45*  CREATININE 4.40* 5.25*  CALCIUM 8.4* 8.1*  PHOS  --  3.9   GFR: Estimated Creatinine Clearance: 12.5 mL/min (A) (by C-G formula based on SCr of 5.25 mg/dL (H)). Liver Function Tests: Recent Labs  Lab 09/25/21 1216  AST 50*  ALT 49*  ALKPHOS 84  BILITOT 0.7  PROT 7.0  ALBUMIN 3.2*   No results for input(s): LIPASE, AMYLASE in the last 168 hours. No results for input(s): AMMONIA in the last 168 hours.  Coagulation Profile: Recent Labs  Lab 09/25/21 1216  INR 1.1   Cardiac Enzymes: No results for input(s): CKTOTAL, CKMB, CKMBINDEX, TROPONINI in the last 168 hours. BNP (last 3 results) No results for input(s): PROBNP in the last 8760 hours. HbA1C: No results for input(s): HGBA1C in the last 72 hours. CBG: Recent Labs  Lab 09/25/21 1140 09/25/21 2008  GLUCAP 132* 116*   Lipid Profile: Recent Labs    09/26/21 0613  CHOL 122  HDL 45  LDLCALC 67  TRIG 48  CHOLHDL 2.7   Thyroid Function Tests: No results for input(s): TSH, T4TOTAL, FREET4, T3FREE, THYROIDAB in the last 72 hours. Anemia Panel: Recent Labs    09/25/21 2044   VITAMINB12 552   Sepsis Labs: Recent Labs  Lab 09/25/21 2044 09/25/21 2331  PROCALCITON 0.44  --   LATICACIDVEN 1.0 0.7    No results found for this or any previous visit (from the past 240 hour(s)).       Radiology Studies: MR BRAIN WO CONTRAST  Result Date: 09/25/2021 CLINICAL DATA:  Neuro deficit, stroke suspected EXAM: MRI HEAD WITHOUT CONTRAST TECHNIQUE: Multiplanar, multiecho pulse sequences of the brain and surrounding structures were obtained without intravenous contrast. COMPARISON:  No prior MRI, correlation is made with CT head 09/25/2021 FINDINGS: Evaluation is somewhat limited by motion artifact. Brain: No restricted diffusion to suggest acute or subacute infarct. No acute hemorrhage, mass, mass effect, or midline shift. No extra-axial collection or hydrocephalus. Confluent T2 hyperintense signal in the periventricular white matter, likely the sequela of severe chronic small vessel ischemic disease. Lacunar infarcts in the bilateral corona radiata, thalami, and cerebellar hemispheres. Multiple punctate foci of susceptibility, which may represent sequela of hypertensive microhemorrhages. Vascular: Normal flow voids. Skull and upper cervical spine: Normal marrow signal. Sinuses/Orbits: Negative.  Status post bilateral lens replacements. Other: Trace fluid in left mastoid air cells. IMPRESSION: No acute intracranial process. Electronically Signed   By: Merilyn Baba M.D.   On: 09/25/2021 19:09   US Carotid Bilateral (at [facility] and [facility] only)  Result Date: 09/25/2021 CLINICAL DATA:  75 year old male with history of transient ischemic attack. EXAM: BILATERAL CAROTID DUPLEX ULTRASOUND TECHNIQUE: Pearline Cables scale imaging, color Doppler and duplex ultrasound were performed of bilateral carotid and vertebral arteries in the neck. COMPARISON:  None. FINDINGS: Criteria: Quantification of carotid stenosis is based on velocity parameters that correlate the residual internal carotid diameter with  NASCET-based stenosis levels, using the diameter of the distal internal carotid lumen as the denominator for stenosis measurement. The following velocity measurements were obtained: RIGHT ICA: Peak systolic velocity 36 cm/sec, End diastolic velocity 8 cm/sec CCA: Peak systolic velocity 42 cm/sec SYSTOLIC ICA/CCA RATIO:  0.9 ECA: Peak systolic velocity 47 cm/sec LEFT ICA: Peak systolic velocity 66 cm/sec, End diastolic velocity 17 cm/sec CCA: 48 cm/sec SYSTOLIC ICA/CCA RATIO:  1.4 ECA: 38 cm/sec RIGHT CAROTID ARTERY: Mild scattered atherosclerotic plaque formation. No significant tortuosity. Normal low resistance waveforms. RIGHT VERTEBRAL ARTERY:  Antegrade flow. LEFT CAROTID ARTERY: Mild scattered atherosclerotic plaque formation. No significant tortuosity. Normal low resistance waveforms. LEFT VERTEBRAL ARTERY:  Antegrade flow. Upper extremity non-invasive blood pressures: Not obtained. IMPRESSION: 1. Right carotid artery system: Less than 50% stenosis secondary to mild scattered atherosclerotic plaque formation. 2. Left carotid artery system: Less than 50% stenosis secondary to mild scattered atherosclerotic plaque formation. 3.  Vertebral artery system: Patent with antegrade flow bilaterally. Ruthann Cancer, MD [facility] Radiology Electronically Signed   By: Glade Nurse.D.  On: 09/25/2021 16:58   DG Chest Portable 1 View  Result Date: 09/25/2021 CLINICAL DATA:  Hypoxia, dyspnea, dialysis patient EXAM: PORTABLE CHEST 1 VIEW COMPARISON:  06/27/2021 chest radiograph. FINDINGS: Mitral valve annuloplasty ring in place. Intact sternotomy wires. Low lung volumes. Inferior approach central venous catheter terminates at the inferior cavoatrial junction. Stable cardiomediastinal silhouette with normal heart size. No pneumothorax. Small to moderate loculated peripheral left pleural effusion, unchanged. No right pleural effusion. Diffuse prominence of the perihilar  interstitial markings with patchy left lung base opacity and volume loss. Chronic calcified left thyroid nodule, unchanged. IMPRESSION: 1. Low lung volumes. Diffuse prominence of the perihilar interstitial markings with patchy left lung base opacity and volume loss, favor a combination of pulmonary edema and atelectasis. 2. Stable small to moderate loculated peripheral left pleural effusion. Electronically Signed   By: Ilona Sorrel M.D.   On: 09/25/2021 12:34   CT HEAD CODE STROKE WO CONTRAST  Addendum Date: 09/25/2021   ADDENDUM REPORT: 09/25/2021 12:05 ADDENDUM: Study discussed by telephone with Dr. Ellender Hose on 09/25/2021 at 12:04 . Electronically Signed   By: Genevie Ann M.D.   On: 09/25/2021 12:05   Result Date: 09/25/2021 CLINICAL DATA:  Code stroke. 75 year old male dialysis patient with slurred speech, neurologic deficit. EXAM: CT HEAD WITHOUT CONTRAST TECHNIQUE: Contiguous axial images were obtained from the base of the skull through the vertex without intravenous contrast. COMPARISON:  Head CT 11/14/2020 and earlier. FINDINGS: Brain: Stable cerebral volume. No midline shift, mass effect, or evidence of intracranial mass lesion. No ventriculomegaly. No acute intracranial hemorrhage identified. Stable patchy bilateral chronic appearing cerebellar infarcts. Heterogeneity in the brainstem has not definitely changed. A small round hypodensity in the medial right thalamus is more conspicuous since January but age indeterminate. Extensive vascular calcifications in the bilateral cerebral white matter and deep gray nuclei. No convincing acute cortically based infarct. Gray-white matter differentiation elsewhere appears stable. Vascular: Extensive Calcified atherosclerosis at the skull base. No suspicious intracranial vascular hyperdensity. Skull: No acute osseous abnormality identified. Sinuses/Orbits: Improved chronic right sphenoid sinusitis. Other Visualized paranasal sinuses and mastoids are stable and well  aerated. Other: Diffuse calcified scalp vessel atherosclerosis. Similar intraorbital vessel calcification. ASPECTS [facility] Stroke Program Early CT Score) Total score (0-10 with 10 being normal): 10 IMPRESSION: 1. Small age indeterminate lacunar infarct in the medial right thalamus. No associated hemorrhage or mass effect. 2. Otherwise stable CT appearance of chronic small vessel and cerebellar ischemic disease since January. ASPECTS 10. 3. Diffuse severe calcified atherosclerosis. Electronically Signed: By: Genevie Ann M.D. On: 09/25/2021 11:56        Scheduled Meds:   stroke: mapping our early stages of recovery book   Does not apply Once   aspirin EC  81 mg Oral Daily   atorvastatin  40 mg Oral Daily   Chlorhexidine Gluconate Cloth  6 each Topical Daily   feeding supplement (NEPRO CARB STEADY)  237 mL Oral BID BM   furosemide  60 mg Intravenous Q12H   [START ON 09/27/2021] levETIRAcetam  500 mg Oral Once per day on Mon Wed Fri   mirtazapine  15 mg Oral QHS   pantoprazole  40 mg Oral Daily   polyethylene glycol  17 g Oral Daily   senna-docusate  1 tablet Oral QHS   sertraline  25 mg Oral QHS   sevelamer carbonate  800 mg Oral TID WC   Continuous Infusions:  levETIRAcetam 500 mg (09/26/21 0136)   thiamine injection 500 mg (09/26/21 0022)  Assessment & Plan:   Principal Problem:   TIA (transient ischemic attack)   1.  slurred speech During HD TIA v.s. transient hypotension during HD MRI without acute findings. Neurology was consulted EEG negative Able to swallow MS at baseline EF 9/22 50-55% Carotid US no significant stenosis <50%     2.  End-stage renal disease  mild acute on chronic diastolic CHF. - Nephrology consult  Continue IV Lasix for pulmonary congestion Next dialysis tomorrow Patient endorsed he wanted to stop dialysis.  I notified Dr. Candiss Norse.  Palliative consulted to come and discuss goals of care with patient and daughter.      3.  Paroxysmal atrial  fibrillation. On amiodarone.  Here overnight bradycardic. Holding. Evaluate and see when stable to resume  4.  Dyslipidemia. LDL less than 65 at goal Continue statin  5.  Seizure disorder. continue Keppra.  6.  Gout. continue allopurinol.  7.Depression  On zoloft  DVT prophylaxis: Heparin Code Status: DNR Family Communication: Updated daughter Disposition Plan:  Status is: Inpatient  Remains inpatient appropriate because: Due to severity of illness and work-up pending            LOS: 1 day   Time spent: 35 minutes with more than 50% on [place], MD [facility] Pager [phone]  If 7PM-7AM, please contact night-coverage 09/26/2021, 8:43 AM

## 2021-09-26 NOTE — Progress Notes (Signed)
[facility]  ROUNDING NOTE   Subjective:   Patient seen resting comfortable Alert and responding appropriately to questions Currently NPO awaiting swallow eval Unable to verbalize what brought him in ED Denies pain and discomfort Denies shortness of breath  Objective:  Vital signs in last 24 hours:  Temp:  [97.8 F (36.6 C)-98 F (36.7 C)] 97.9 F (36.6 C) (12/13 1110) Pulse Rate:  [57-71] 65 (12/13 1110) Resp:  [12-22] 17 (12/13 1110) BP: (142-186)/(67-106) 158/77 (12/13 1110) SpO2:  [96 %-100 %] 100 % (12/13 1110) Weight:  [73.8 kg] 73.8 kg (12/13 0356)  Weight change:  Filed Weights   09/25/21 2319 09/26/21 0356  Weight: 73.8 kg 73.8 kg    Intake/Output: I/O last 3 completed shifts: In: 150.2 [IV Piggyback:150.2] Out: 75 [Urine:75]   Intake/Output this shift:  No intake/output data recorded.  Physical Exam: General: NAD,   Head: Normocephalic, atraumatic. Moist oral mucosal membranes  Eyes: Anicteric  Lungs:  Clear to auscultation, normal effort, 2L [place]  Heart: Regular rate and rhythm  Abdomen:  Soft, nontender  Extremities:  no peripheral edema.  Neurologic: Nonfocal, moving all four extremities  Skin: No lesions  Access: Rt thigh Permcath    Basic Metabolic Panel: Recent Labs  Lab 09/25/21 1216 09/26/21 0613  NA 134* 136  K 4.3 4.9  CL 96* 97*  CO2 29 31  GLUCOSE 136* 75  BUN 38* 45*  CREATININE 4.40* 5.25*  CALCIUM 8.4* 8.1*  PHOS  --  3.9    Liver Function Tests: Recent Labs  Lab 09/25/21 1216  AST 50*  ALT 49*  ALKPHOS 84  BILITOT 0.7  PROT 7.0  ALBUMIN 3.2*   No results for input(s): LIPASE, AMYLASE in the last 168 hours. No results for input(s): AMMONIA in the last 168 hours.  CBC: Recent Labs  Lab 09/25/21 1216  WBC 7.9  NEUTROABS 5.2  HGB 11.5*  HCT 34.6*  MCV 84.6  PLT 186    Cardiac Enzymes: No results for input(s): CKTOTAL, CKMB, CKMBINDEX, TROPONINI in the last 168 hours.  BNP: Invalid  input(s): POCBNP  CBG: Recent Labs  Lab 09/25/21 1140 09/25/21 2008  GLUCAP 132* 116*    Microbiology: Results for orders placed or performed during the hospital encounter of 06/26/21  Blood culture (routine x 2)     Status: None   Collection Time: 06/27/21 12:42 AM   Specimen: BLOOD  Result Value Ref Range Status   Specimen Description BLOOD RIGHT ASSIST CONTROL  Final   Special Requests   Final    BOTTLES DRAWN AEROBIC AND ANAEROBIC Blood Culture adequate volume   Culture   Final    NO GROWTH 5 DAYS Performed at [facility], [address] [place], [place] [address]    Report Status 07/02/2021 FINAL  Final  Resp Panel by RT-PCR (Flu A&B, Covid) Nasopharyngeal Swab     Status: None   Collection Time: 06/27/21 12:42 AM   Specimen: Nasopharyngeal Swab; Nasopharyngeal(NP) swabs in vial transport medium  Result Value Ref Range Status   SARS Coronavirus 2 by RT PCR NEGATIVE NEGATIVE Final    Comment: (NOTE) SARS-CoV-2 target nucleic acids are NOT DETECTED.  The SARS-CoV-2 RNA is generally detectable in upper respiratory specimens during the acute phase of infection. The lowest concentration of SARS-CoV-2 viral copies this assay can detect is 138 copies/mL. A negative result does not preclude SARS-Cov-2 infection and should not be used as the sole basis for treatment or other patient management  decisions. A negative result may occur with  improper specimen collection/handling, submission of specimen other than nasopharyngeal swab, presence of viral mutation(s) within the areas targeted by this assay, and inadequate number of viral copies(<138 copies/mL). A negative result must be combined with clinical observations, patient history, and epidemiological information. The expected result is Negative.  Fact Sheet for Patients:  EntrepreneurPulse.com.au  Fact Sheet for Healthcare Providers:  IncredibleEmployment.be  This test is  no t yet approved or cleared by the [place] FDA and  has been authorized for detection and/or diagnosis of SARS-CoV-2 by FDA under an Emergency Use Authorization (EUA). This EUA will remain  in effect (meaning this test can be used) for the duration of the COVID-19 declaration under Section 564(b)(1) of the Act, 21 U.S.C.section 360bbb-3(b)(1), unless the authorization is terminated  or revoked sooner.       Influenza A by PCR NEGATIVE NEGATIVE Final   Influenza B by PCR NEGATIVE NEGATIVE Final    Comment: (NOTE) The Xpert Xpress SARS-CoV-2/FLU/RSV plus assay is intended as an aid in the diagnosis of influenza from Nasopharyngeal swab specimens and should not be used as a sole basis for treatment. Nasal washings and aspirates are unacceptable for Xpert Xpress SARS-CoV-2/FLU/RSV testing.  Fact Sheet for Patients: EntrepreneurPulse.com.au  Fact Sheet for Healthcare Providers: IncredibleEmployment.be  This test is not yet approved or cleared by the [place] FDA and has been authorized for detection and/or diagnosis of SARS-CoV-2 by FDA under an Emergency Use Authorization (EUA). This EUA will remain in effect (meaning this test can be used) for the duration of the COVID-19 declaration under Section 564(b)(1) of the Act, 21 U.S.C. section 360bbb-3(b)(1), unless the authorization is terminated or revoked.  Performed at [facility], [address] [place], [place] [address]   Blood culture (routine x 2)     Status: None   Collection Time: 06/27/21 12:43 AM   Specimen: BLOOD  Result Value Ref Range Status   Specimen Description BLOOD RIGHT FOREARM  Final   Special Requests   Final    BOTTLES DRAWN AEROBIC AND ANAEROBIC Blood Culture adequate volume   Culture   Final    NO GROWTH 5 DAYS Performed at [facility], [address], [place], [place] [address]    Report Status 07/02/2021 FINAL  Final  MRSA Next Gen  by PCR, Nasal     Status: None   Collection Time: 06/28/21  5:31 AM   Specimen: Nasal Mucosa; Nasal Swab  Result Value Ref Range Status   MRSA by PCR Next Gen NOT DETECTED NOT DETECTED Final    Comment: (NOTE) The GeneXpert MRSA Assay (FDA approved for NASAL specimens only), is one component of a comprehensive MRSA colonization surveillance program. It is not intended to diagnose MRSA infection nor to guide or monitor treatment for MRSA infections. Test performance is not FDA approved in patients less than 44 years old. Performed at [facility], [address] [place], [place] [address]   Resp Panel by RT-PCR (Flu A&B, Covid) Nasopharyngeal Swab     Status: None   Collection Time: 07/18/21  2:00 PM   Specimen: Nasopharyngeal Swab; Nasopharyngeal(NP) swabs in vial transport medium  Result Value Ref Range Status   SARS Coronavirus 2 by RT PCR NEGATIVE NEGATIVE Final    Comment: (NOTE) SARS-CoV-2 target nucleic acids are NOT DETECTED.  The SARS-CoV-2 RNA is generally detectable in upper respiratory specimens during the acute phase of infection. The lowest concentration of SARS-CoV-2 viral  copies this assay can detect is 138 copies/mL. A negative result does not preclude SARS-Cov-2 infection and should not be used as the sole basis for treatment or other patient management decisions. A negative result may occur with  improper specimen collection/handling, submission of specimen other than nasopharyngeal swab, presence of viral mutation(s) within the areas targeted by this assay, and inadequate number of viral copies(<138 copies/mL). A negative result must be combined with clinical observations, patient history, and epidemiological information. The expected result is Negative.  Fact Sheet for Patients:  EntrepreneurPulse.com.au  Fact Sheet for Healthcare Providers:  IncredibleEmployment.be  This test is no t yet approved or cleared by  the [place] FDA and  has been authorized for detection and/or diagnosis of SARS-CoV-2 by FDA under an Emergency Use Authorization (EUA). This EUA will remain  in effect (meaning this test can be used) for the duration of the COVID-19 declaration under Section 564(b)(1) of the Act, 21 U.S.C.section 360bbb-3(b)(1), unless the authorization is terminated  or revoked sooner.       Influenza A by PCR NEGATIVE NEGATIVE Final   Influenza B by PCR NEGATIVE NEGATIVE Final    Comment: (NOTE) The Xpert Xpress SARS-CoV-2/FLU/RSV plus assay is intended as an aid in the diagnosis of influenza from Nasopharyngeal swab specimens and should not be used as a sole basis for treatment. Nasal washings and aspirates are unacceptable for Xpert Xpress SARS-CoV-2/FLU/RSV testing.  Fact Sheet for Patients: EntrepreneurPulse.com.au  Fact Sheet for Healthcare Providers: IncredibleEmployment.be  This test is not yet approved or cleared by the [place] FDA and has been authorized for detection and/or diagnosis of SARS-CoV-2 by FDA under an Emergency Use Authorization (EUA). This EUA will remain in effect (meaning this test can be used) for the duration of the COVID-19 declaration under Section 564(b)(1) of the Act, 21 U.S.C. section 360bbb-3(b)(1), unless the authorization is terminated or revoked.  Performed at [facility], [address] [place], [place] [address]     Coagulation Studies: Recent Labs    09/25/21 1216  LABPROT 14.4  INR 1.1    Urinalysis: No results for input(s): COLORURINE, LABSPEC, PHURINE, GLUCOSEU, HGBUR, BILIRUBINUR, KETONESUR, PROTEINUR, UROBILINOGEN, NITRITE, LEUKOCYTESUR in the last 72 hours.  Invalid input(s): APPERANCEUR    Imaging: MR BRAIN WO CONTRAST  Result Date: 09/25/2021 CLINICAL DATA:  Neuro deficit, stroke suspected EXAM: MRI HEAD WITHOUT CONTRAST TECHNIQUE: Multiplanar, multiecho pulse sequences  of the brain and surrounding structures were obtained without intravenous contrast. COMPARISON:  No prior MRI, correlation is made with CT head 09/25/2021 FINDINGS: Evaluation is somewhat limited by motion artifact. Brain: No restricted diffusion to suggest acute or subacute infarct. No acute hemorrhage, mass, mass effect, or midline shift. No extra-axial collection or hydrocephalus. Confluent T2 hyperintense signal in the periventricular white matter, likely the sequela of severe chronic small vessel ischemic disease. Lacunar infarcts in the bilateral corona radiata, thalami, and cerebellar hemispheres. Multiple punctate foci of susceptibility, which may represent sequela of hypertensive microhemorrhages. Vascular: Normal flow voids. Skull and upper cervical spine: Normal marrow signal. Sinuses/Orbits: Negative.  Status post bilateral lens replacements. Other: Trace fluid in left mastoid air cells. IMPRESSION: No acute intracranial process. Electronically Signed   By: Merilyn Baba M.D.   On: 09/25/2021 19:09   US Carotid Bilateral (at [facility] and [facility] only)  Result Date: 09/25/2021 CLINICAL DATA:  75 year old male with history of transient ischemic attack. EXAM: BILATERAL CAROTID DUPLEX ULTRASOUND TECHNIQUE: Pearline Cables scale imaging, color Doppler and duplex ultrasound were performed of  bilateral carotid and vertebral arteries in the neck. COMPARISON:  None. FINDINGS: Criteria: Quantification of carotid stenosis is based on velocity parameters that correlate the residual internal carotid diameter with NASCET-based stenosis levels, using the diameter of the distal internal carotid lumen as the denominator for stenosis measurement. The following velocity measurements were obtained: RIGHT ICA: Peak systolic velocity 36 cm/sec, End diastolic velocity 8 cm/sec CCA: Peak systolic velocity 42 cm/sec SYSTOLIC ICA/CCA RATIO:  0.9 ECA: Peak systolic velocity 47 cm/sec LEFT ICA: Peak systolic velocity 66 cm/sec, End diastolic  velocity 17 cm/sec CCA: 48 cm/sec SYSTOLIC ICA/CCA RATIO:  1.4 ECA: 38 cm/sec RIGHT CAROTID ARTERY: Mild scattered atherosclerotic plaque formation. No significant tortuosity. Normal low resistance waveforms. RIGHT VERTEBRAL ARTERY:  Antegrade flow. LEFT CAROTID ARTERY: Mild scattered atherosclerotic plaque formation. No significant tortuosity. Normal low resistance waveforms. LEFT VERTEBRAL ARTERY:  Antegrade flow. Upper extremity non-invasive blood pressures: Not obtained. IMPRESSION: 1. Right carotid artery system: Less than 50% stenosis secondary to mild scattered atherosclerotic plaque formation. 2. Left carotid artery system: Less than 50% stenosis secondary to mild scattered atherosclerotic plaque formation. 3.  Vertebral artery system: Patent with antegrade flow bilaterally. Ruthann Cancer, MD [facility] Electronically Signed   By: Ruthann Cancer M.D.   On: 09/25/2021 16:58   DG Chest Portable 1 View  Result Date: 09/25/2021 CLINICAL DATA:  Hypoxia, dyspnea, dialysis patient EXAM: PORTABLE CHEST 1 VIEW COMPARISON:  06/27/2021 chest radiograph. FINDINGS: Mitral valve annuloplasty ring in place. Intact sternotomy wires. Low lung volumes. Inferior approach central venous catheter terminates at the inferior cavoatrial junction. Stable cardiomediastinal silhouette with normal heart size. No pneumothorax. Small to moderate loculated peripheral left pleural effusion, unchanged. No right pleural effusion. Diffuse prominence of the perihilar interstitial markings with patchy left lung base opacity and volume loss. Chronic calcified left thyroid nodule, unchanged. IMPRESSION: 1. Low lung volumes. Diffuse prominence of the perihilar interstitial markings with patchy left lung base opacity and volume loss, favor a combination of pulmonary edema and atelectasis. 2. Stable small to moderate loculated peripheral left pleural effusion. Electronically Signed   By:  Ilona Sorrel M.D.   On: 09/25/2021 12:34   CT HEAD CODE STROKE WO CONTRAST  Addendum Date: 09/25/2021   ADDENDUM REPORT: 09/25/2021 12:05 ADDENDUM: Study discussed by telephone with Dr. Ellender Hose on 09/25/2021 at 12:04 . Electronically Signed   By: Genevie Ann M.D.   On: 09/25/2021 12:05   Result Date: 09/25/2021 CLINICAL DATA:  Code stroke. 75 year old male dialysis patient with slurred speech, neurologic deficit. EXAM: CT HEAD WITHOUT CONTRAST TECHNIQUE: Contiguous axial images were obtained from the base of the skull through the vertex without intravenous contrast. COMPARISON:  Head CT 11/14/2020 and earlier. FINDINGS: Brain: Stable cerebral volume. No midline shift, mass effect, or evidence of intracranial mass lesion. No ventriculomegaly. No acute intracranial hemorrhage identified. Stable patchy bilateral chronic appearing cerebellar infarcts. Heterogeneity in the brainstem has not definitely changed. A small round hypodensity in the medial right thalamus is more conspicuous since January but age indeterminate. Extensive vascular calcifications in the bilateral cerebral white matter and deep gray nuclei. No convincing acute cortically based infarct. Gray-white matter differentiation elsewhere appears stable. Vascular: Extensive Calcified atherosclerosis at the skull base. No suspicious intracranial vascular hyperdensity. Skull: No acute osseous abnormality identified. Sinuses/Orbits: Improved chronic right sphenoid sinusitis. Other Visualized paranasal sinuses and mastoids are stable and well aerated. Other: Diffuse calcified scalp vessel atherosclerosis. Similar intraorbital vessel calcification. ASPECTS [facility] Stroke Program Early CT Score)  Total score (0-10 with 10 being normal): 10 IMPRESSION: 1. Small age indeterminate lacunar infarct in the medial right thalamus. No associated hemorrhage or mass effect. 2. Otherwise stable CT appearance of chronic small vessel and cerebellar ischemic disease since  January. ASPECTS 10. 3. Diffuse severe calcified atherosclerosis. Electronically Signed: By: Genevie Ann M.D. On: 09/25/2021 11:56     Medications:    levETIRAcetam 500 mg (09/26/21 0136)   thiamine injection 500 mg (09/26/21 1058)     stroke: mapping our early stages of recovery book   Does not apply Once   aspirin EC  81 mg Oral Daily   atorvastatin  40 mg Oral Daily   Chlorhexidine Gluconate Cloth  6 each Topical Daily   feeding supplement (NEPRO CARB STEADY)  237 mL Oral BID BM   furosemide  60 mg Intravenous Q12H   [START ON 09/27/2021] levETIRAcetam  500 mg Oral Once per day on Mon Wed Fri   mirtazapine  15 mg Oral QHS   pantoprazole  40 mg Oral Daily   polyethylene glycol  17 g Oral Daily   senna-docusate  1 tablet Oral QHS   sertraline  25 mg Oral QHS   sevelamer carbonate  800 mg Oral TID WC     Assessment/ Plan:  Ernest [name] is a 75 y.o.  male  with medical problems of  ESRD, COPD, chronic systolic CHF, coronary artery disease status post CABG and mitral valve repair in 2018, depression, gout, hepatitis C, history of seizures, hyperlipidemia, stroke, history of diabetes, severe diffuse calcified atherosclerosis, chronic small vessel ischemic changes and history of indeterminate lacunar infarct in the brain  was admitted on 09/25/2021 for TIA (transient ischemic attack) [G45.9] Slurred speech [R47.81] Chronic respiratory failure with hypoxia (HCC) [J96.11] Acute encephalopathy [G93.40]  [facility] [address]/MWF/Rt groin Permcath   End stage renal disease on dialysis. Will maintain outpatient scheduled, if possible. Received full dialysis treatment prior to ED presentation yesterday. Next treatment scheduled for Wednesday  2. Anemia of chronic kidney disease Lab Results  Component Value Date   HGB 11.5 (L) 09/25/2021    Hgb within goal. Will continue to monitor  3. Secondary Hyperparathyroidism:  Lab Results  Component Value Date   PTH 63 10/05/2017   PTH  Comment 10/05/2017   CALCIUM 8.1 (L) 09/26/2021   CAION 1.05 (L) 07/12/2020   PHOS 3.9 09/26/2021    Calcium and phosphorus within acceptable range.   4. Hypertension with chronic kidney disease.  Currently receiving furosemide 60 mg IV twice daily.  Blood pressure currently 158/77    LOS: 1   12/13/202211:57 AM

## 2021-09-26 NOTE — Evaluation (Signed)
Clinical/Bedside Swallow Evaluation Patient Details  Name: Ernest [name] MRN: [id number] Date of Birth: 10-18-1945  Today's Date: 09/26/2021 Time: SLP Start Time (ACUTE ONLY): 0810 SLP Stop Time (ACUTE ONLY): 6712 SLP Time Calculation (min) (ACUTE ONLY): 55 min  Past Medical History:  Past Medical History:  Diagnosis Date   Abnormal stress test    s/p cath November 2013 with modest disease involving the ostial left main, proximal LAD, proximal RCA - do not appear to be hemodynamically signficant; mild LV dysfunction   Anemia    low iron   Anxiety    Arthritis    Bacteremia    Chronic systolic CHF (congestive heart failure) (HCC)    COPD (chronic obstructive pulmonary disease) ([place])    Coronary artery disease    a. per cath report 2013, b. 09/13/2017-CABG X4 & Mitral valve repair   Depression    DVT (deep venous thrombosis) (HCC)    arm - right- finished blood thinner- not sure when it   Ejection fraction < 50%    Erectile dysfunction    penile implant   ESRD (end stage renal disease) ([place])    [facility] on a MWF schedule.      Gout    Hx: of   Hepatitis C    History of seizures    last one 2018   Hyperlipidemia    Hypertension    Mitral valve disease    s/p Mitral repair 09/13/17   Obesity    Retinopathy    Shortness of breath    Hx: of with exertion   Stroke [facility])    "several" left side weakness, speech affected   Type II or unspecified type diabetes mellitus without mention of complication, not stated as uncontrolled    adult onset   Past Surgical History:  Past Surgical History:  Procedure Laterality Date   A/V FISTULAGRAM N/A 10/04/2017   Procedure: A/V FISTULAGRAM;  Surgeon: Elam Dutch, MD;  Location: [place] [facility];  Service: Cardiovascular;  Laterality: N/A;   A/V SHUNTOGRAM Left 07/12/2020   Procedure: A/V SHUNTOGRAM;  Surgeon: Serafina Mitchell, MD;  Location: [place] CV LAB;  Service: Cardiovascular;  Laterality: Left;   AMPUTATION Right  07/08/2021   Procedure: AMPUTATION ABOVE KNEE;  Surgeon: Elmore Guise, MD;  Location: [facility] ORS;  Service: Vascular;  Laterality: Right;   ANGIOPLASTY Left 09/24/2019   Procedure: Angioplasty Innominate Vien;  Surgeon: Serafina Mitchell, MD;  Location: [place];  Service: Vascular;  Laterality: Left;   AV FISTULA PLACEMENT Left 01/13/2013   Procedure: INSERTION OF ARTERIOVENOUS (AV) GORE-TEX GRAFT ARM;  Surgeon: Angelia Mould, MD;  Location: [place];  Service: Vascular;  Laterality: Left;   AV FISTULA PLACEMENT Left 05/05/2013   Procedure: INSERTION OF LEFT UPPER ARM  ARTERIOVENOUS GORTEX GRAFT;  Surgeon: Angelia Mould, MD;  Location: [place];  Service: Vascular;  Laterality: Left;   AV FISTULA PLACEMENT Left 11/26/2019   Procedure: INSERTION OF ARTERIOVENOUS (AV) GORE-TEX GRAFT, left  ARM;  Surgeon: Serafina Mitchell, MD;  Location: [place];  Service: Vascular;  Laterality: Left;   [place] REMOVAL Left 03/26/2013   Procedure: REMOVAL OF  NON INCORPORATED ARTERIOVENOUS GORETEX GRAFT ([place]) left arm * repair of  left brachial artery with vein patch angioplasty.;  Surgeon: Angelia Mould, MD;  Location: [place];  Service: Vascular;  Laterality: Left;   CARDIAC CATHETERIZATION  August 26, 2012   CATARACT EXTRACTION W/ INTRAOCULAR LENS  IMPLANT, BILATERAL  COLONOSCOPY     CORONARY ARTERY BYPASS GRAFT N/A 09/13/2017   Procedure: CORONARY ARTERY BYPASS GRAFTING (CABG) times four LIMA  to LAD, SVG sequentially to OM and RAMUS Intermediate and SVG to Acute Marginal;  Surgeon: Melrose Nakayama, MD;  Location: [place];  Service: Open Heart Surgery;  Laterality: N/A;   DIALYSIS/PERMA CATHETER INSERTION N/A 02/28/2021   Procedure: DIALYSIS/PERMA CATHETER EXCHANGE;  Surgeon: Katha Cabal, MD;  Location: [place] CV LAB;  Service: Cardiovascular;  Laterality: N/A;   EMBOLECTOMY Right 08/27/2013   Procedure: EMBOLECTOMY;  Surgeon: Mal Misty, MD;  Location: [place];  Service: Vascular;   Laterality: Right;  Thrombectomy of Radial and ulnar artery.   ENDOVASCULAR STENT INSERTION Left 09/24/2019   Procedure: Endovascular Stent Graft Insertion, Arm Graft;  Surgeon: Serafina Mitchell, MD;  Location: [facility] OR;  Service: Vascular;  Laterality: Left;   EYE SURGERY     laser.  and surgery for DM   fistula     RUE and wrist   FISTULOGRAM Left 09/24/2019   Procedure: SHUNTOGRAM OF ARTERIOVENOUS FISTULA LEFT ARM,;  Surgeon: Serafina Mitchell, MD;  Location: [place];  Service: Vascular;  Laterality: Left;   INSERTION OF DIALYSIS CATHETER Right 01/01/2013   Procedure: INSERTION OF DIALYSIS CATHETER right  internal jugular;  Surgeon: Serafina Mitchell, MD;  Location: [place];  Service: Vascular;  Laterality: Right;   INSERTION OF DIALYSIS CATHETER N/A 03/26/2013   Procedure: INSERTION OF DIALYSIS CATHETER Left internal jugular vein;  Surgeon: Angelia Mould, MD;  Location: [place];  Service: Vascular;  Laterality: N/A;   INSERTION OF DIALYSIS CATHETER Right 11/26/2019   Procedure: INSERTION OF DIALYSIS CATHETER, right internal jugular;  Surgeon: Serafina Mitchell, MD;  Location: [place];  Service: Vascular;  Laterality: Right;   IR THORACENTESIS ASP PLEURAL SPACE W/IMG GUIDE  09/30/2019   LEFT HEART CATH AND CORONARY ANGIOGRAPHY N/A 09/09/2017   Procedure: LEFT HEART CATH AND CORONARY ANGIOGRAPHY;  Surgeon: Troy Sine, MD;  Location: [place] CV LAB;  Service: Cardiovascular;  Laterality: N/A;   LIGATION OF ARTERIOVENOUS  FISTULA Right 12/30/2013   Procedure: REMOVAL OF SEGMENT OF GORTEX GRAFT AND FISTULA  AND REPAIR OF BRACHIAL ARTERY;  Surgeon: Mal Misty, MD;  Location: [place];  Service: Vascular;  Laterality: Right;   LOWER EXTREMITY ANGIOGRAPHY N/A 06/28/2021   Procedure: Lower Extremity Angiography;  Surgeon: Katha Cabal, MD;  Location: [place] CV LAB;  Service: Cardiovascular;  Laterality: N/A;   MITRAL VALVE REPAIR N/A 09/13/2017   Procedure: MITRAL VALVE REPAIR (MVR);   Surgeon: Melrose Nakayama, MD;  Location: [place];  Service: Open Heart Surgery;  Laterality: N/A;   MITRAL VALVE REPAIR (MV)/CORONARY ARTERY BYPASS GRAFTING (CABG)  09/13/2017   PENILE PROSTHESIS IMPLANT     1997.. no card   PERIPHERAL VASCULAR BALLOON ANGIOPLASTY Left 10/04/2017   Procedure: PERIPHERAL VASCULAR BALLOON ANGIOPLASTY;  Surgeon: Elam Dutch, MD;  Location: [place] CV LAB;  Service: Cardiovascular;  Laterality: Left;  AVF   PERIPHERAL VASCULAR BALLOON ANGIOPLASTY Left 07/12/2020   Procedure: PERIPHERAL VASCULAR BALLOON ANGIOPLASTY;  Surgeon: Serafina Mitchell, MD;  Location: [place] CV LAB;  Service: Cardiovascular;  Laterality: Left;  ARM SHUNTOGRAM   REVISION OF ARTERIOVENOUS GORETEX GRAFT Left 09/24/2019   Procedure: REVISION OF ARTERIOVENOUS GORETEX GRAFT LEFT ARM;  Surgeon: Serafina Mitchell, MD;  Location: [facility] OR;  Service: Vascular;  Laterality: Left;   TEE WITHOUT CARDIOVERSION N/A 06/11/2013  Procedure: TRANSESOPHAGEAL ECHOCARDIOGRAM (TEE);  Surgeon: Larey Dresser, MD;  Location: [facility] ENDOSCOPY;  Service: Cardiovascular;  Laterality: N/A;   TEE WITHOUT CARDIOVERSION N/A 09/13/2017   Procedure: TRANSESOPHAGEAL ECHOCARDIOGRAM (TEE);  Surgeon: Melrose Nakayama, MD;  Location: [place];  Service: Open Heart Surgery;  Laterality: N/A;   THROMBECTOMY W/ EMBOLECTOMY Left 03/26/2013   Procedure: Attempted thrombectomy of left arm arteriovenous goretex graft.;  Surgeon: Angelia Mould, MD;  Location: [facility] OR;  Service: Vascular;  Laterality: Left;   ULTRASOUND GUIDANCE FOR VASCULAR ACCESS Bilateral 09/24/2019   Procedure: Ultrasound Guidance For Vascular Access, Right Femoral Vein and Left Arm Graft;  Surgeon: Serafina Mitchell, MD;  Location: [facility] OR;  Service: Vascular;  Laterality: Bilateral;   VENOGRAM N/A 04/07/2013   Procedure: VENOGRAM;  Surgeon: Serafina Mitchell, MD;  Location: [facility] CATH LAB;  Service: Cardiovascular;  Laterality: N/A;   HPI:  Pt is a 75 y.o.   male  with medical problems of  ESRD, COPD, chronic systolic CHF, coronary artery disease status post CABG x4 and mitral valve repair in 2018, depression, gout, hepatitis C, history of seizures, hyperlipidemia, stroke, history of diabetes, severe diffuse calcified atherosclerosis, chronic small vessel ischemic changes and history of indeterminate lacunar infarct in the brain  was admitted on 09/25/2021 for TIA.  MRI: NO acute changes; "sequela of  severe chronic small vessel ischemic disease. Lacunar infarcts in  the bilateral corona radiata, thalami, and cerebellar hemispheres.  Multiple punctate foci of susceptibility, which may represent  sequela of hypertensive microhemorrhages.". Prior Head CT: "Moderate cerebral and cerebellar atrophy.".   CXR: Low lung volumes. Diffuse prominence of the perihilar  interstitial markings with patchy left lung base opacity and volume  loss, favor a combination of pulmonary edema and atelectasis.    Assessment / Plan / Recommendation  Clinical Impression  Pt appears to present w/ grossly adequate oropharyngeal phase swallow function w/ No pharyngeal phase dysphagia noted, No neuromuscular deficits noted impacting pharyngeal swallowing. During the oral phase, pt exhibited min slower mashing/gumming of solids d/t slower oral movements and Edentulous status -- pt was recommended a Minced foods diet during a BSE in 2018 but has since been on a Regular diet at home per his report. Pt consumed po trials w/ No overt, clinical s/s of aspiration during po trials. Pt appears at reduced risk for aspiration following general aspiration precautions; Chopping meats for ease of oral phase management. Unsure of pt's Baseline Cognitive status/decline; see chart notes. Any Cognitive impairments can increase risk for dysphagia, aspiration. Pt is also unable to feed himself at Baseline; this too can increase risk for dysphagia, aspiration.  During po trials, pt consumed all consistencies w/ no  overt coughing, decline in vocal quality, or change in respiratory presentation during/post trials. No decline in O2 sats, 97%. Oral phase appeared [facility] w/ timely bolus management and control of bolus propulsion for A-P transfer for swallowing w/ liquids and purees; min extra Time needed for increased textured foods as described above. Oral clearing achieved w/ all trial consistencies given Time. OM Exam appeared grossly [facility] w/ no unilateral weakness noted. Speech more muttered/mumbled -- unsure of pt's Baseline w/ no family present. Pt does appear to have some degree of Baseline Cognitive decline per chart notes; see MRI results. Pt required full support feeding self -- noted same in BSE in 2018.     Recommend a Mech Soft consistency diet w/ well-Chopped meats, moistened foods; Thin liquids. Recommend general aspiration precautions - pt should  help hold Cup to drink, Pills WHOLE in Puree for safer, easier swallowing. Education given on Pills in Puree; food consistencies and easy to eat options; general aspiration precautions. NSG to reconsult if any new needs arise. NSG agreed. SLP Visit Diagnosis: Dysphagia, oral phase (R13.11)    Aspiration Risk   (reduced following general aspiration precautions; support w/ feeding at meals (baseline))    Diet Recommendation   Mech Soft consistency diet w/ well-Chopped meats, moistened foods; Thin liquids. Recommend general aspiration precautions. Support w/ feeding at meals - pt should help hold Cup to drink. Reduce distractions at meals.   Medication Administration: Whole meds with puree    Other  Recommendations Recommended Consults:  (Dietician f/u) Oral Care Recommendations: Oral care BID;Oral care before and after PO;Staff/trained caregiver to provide oral care Other Recommendations:  (n/a)    Recommendations for follow up therapy are one component of a multi-disciplinary discharge planning process, led by the attending physician.  Recommendations may be  updated based on patient status, additional functional criteria and insurance authorization.  Follow up Recommendations No SLP follow up      Assistance Recommended at Discharge Intermittent Supervision/Assistance (baseline feeding support)  Functional Status Assessment Patient has had a recent decline in their functional status and demonstrates the ability to make significant improvements in function in a reasonable and predictable amount of time.  Frequency and Duration  (n/a)   (n/a)       Prognosis Prognosis for Safe Diet Advancement: Fair (-Good) Barriers to Reach Goals: Severity of deficits;Time post onset (unsure of Baseline Cognitive status/decline -- see chart)      Swallow Study   General Date of Onset: 09/25/21 HPI: Pt is a 75 y.o.  male  with medical problems of  ESRD, COPD, chronic systolic CHF, coronary artery disease status post CABG x4 and mitral valve repair in 2018, depression, gout, hepatitis C, history of seizures, hyperlipidemia, stroke, history of diabetes, severe diffuse calcified atherosclerosis, chronic small vessel ischemic changes and history of indeterminate lacunar infarct in the brain  was admitted on 09/25/2021 for TIA.  MRI: NO acute changes; "sequela of  severe chronic small vessel ischemic disease. Lacunar infarcts in  the bilateral corona radiata, thalami, and cerebellar hemispheres.  Multiple punctate foci of susceptibility, which may represent  sequela of hypertensive microhemorrhages.". Prior Head CT: "Moderate cerebral and cerebellar atrophy.".   CXR: Low lung volumes. Diffuse prominence of the perihilar  interstitial markings with patchy left lung base opacity and volume  loss, favor a combination of pulmonary edema and atelectasis. Type of Study: Bedside Swallow Evaluation Previous Swallow Assessment: 09/2017 - ON A DYS. LEVEL 2 W/ THINS; slower oral phase - Edentulous Diet Prior to this Study: Regular;Thin liquids (per pt report) Temperature Spikes  Noted: No (wbc 6.4) Respiratory Status: Nasal cannula (2L) History of Recent Intubation: No Behavior/Cognition: Alert;Cooperative;Pleasant mood;Distractible;Requires cueing (suspect Baseline deficits -- see MRI, chart) Oral Cavity Assessment: Within Functional Limits Oral Care Completed by SLP: Yes Oral Cavity - Dentition: Edentulous Vision: Functional for self-feeding Self-Feeding Abilities: Needs assist;Needs set up;Total assist (did not feed himself much at eval in 2018) Patient Positioning: Upright in bed (needed positioning) Baseline Vocal Quality: Normal;Low vocal intensity (slow to respond) Volitional Cough: Strong Volitional Swallow: Unable to elicit    Oral/Motor/Sensory Function Overall Oral Motor/Sensory Function: Within functional limits   Ice Chips Ice chips: Within functional limits Presentation: Spoon (fed; 4 trials)   Thin Liquid Thin Liquid: Within functional limits Presentation: Cup;Self Fed;Straw (~8 ozs total --  supported holding cup) Other Comments: water, juice    Nectar Thick Nectar Thick Liquid: Not tested   Honey Thick Honey Thick Liquid: Not tested   Puree Puree: Within functional limits Presentation: Spoon (fed; 4 ozs)   Solid     Solid: Impaired (min) Presentation: Spoon (fed; 8 trials) Oral Phase Impairments: Impaired mastication (edentulous; slower) Oral Phase Functional Implications: Prolonged oral transit Pharyngeal Phase Impairments:  (none)        Orinda Kenner, MS, [employer] [facility] [phone] [place] 09/26/2021,2:23 PM

## 2021-09-26 NOTE — Consult Note (Signed)
PODIATRY CONSULTATION  NAME ADVIT TRETHEWEY MRN [id number] DOB [birth year] DOA 09/25/2021   Reason for consult: Black toes left Chief Complaint  Patient presents with   Code Stroke    History of present illness: 75 y.o. male significant for COPD, CHF, CAD, PSxHx RT AKA admitted for neurodeficit stroke suspected.  Patient also found to have black discoloration to the left toes.  Podiatry was consulted for evaluation.  Patient was seen at bedside today with his daughter present.  Past Medical History:  Diagnosis Date   Abnormal stress test    s/p cath November 2013 with modest disease involving the ostial left main, proximal LAD, proximal RCA - do not appear to be hemodynamically signficant; mild LV dysfunction   Anemia    low iron   Anxiety    Arthritis    Bacteremia    Chronic systolic CHF (congestive heart failure) (HCC)    COPD (chronic obstructive pulmonary disease) ([place])    Coronary artery disease    a. per cath report 2013, b. 09/13/2017-CABG X4 & Mitral valve repair   Depression    DVT (deep venous thrombosis) (HCC)    arm - right- finished blood thinner- not sure when it   Ejection fraction < 50%    Erectile dysfunction    penile implant   ESRD (end stage renal disease) ([place])    [facility] on a MWF schedule.      Gout    Hx: of   Hepatitis C    History of seizures    last one 2018   Hyperlipidemia    Hypertension    Mitral valve disease    s/p Mitral repair 09/13/17   Obesity    Retinopathy    Shortness of breath    Hx: of with exertion   Stroke [facility])    "several" left side weakness, speech affected   Type II or unspecified type diabetes mellitus without mention of complication, not stated as uncontrolled    adult onset    CBC Latest Ref Rng & Units 09/26/2021 09/25/2021 07/18/2021  WBC 4.0 - 10.5 K/uL 6.4 7.9 7.6  Hemoglobin 13.0 - 17.0 g/dL 10.9(L) 11.5(L) 9.0(L)  Hematocrit 39.0 - 52.0 % 33.1(L) 34.6(L) 28.5(L)  Platelets 150 - 400 K/uL 180 186 332     BMP Latest Ref Rng & Units 09/26/2021 09/25/2021 07/15/2021  Glucose 70 - 99 mg/dL 75 136(H) 128(H)  BUN 8 - 23 mg/dL 45(H) 38(H) 43(H)  Creatinine 0.61 - 1.24 mg/dL 5.25(H) 4.40(H) 6.93(H)  Sodium 135 - 145 mmol/L 136 134(L) 133(L)  Potassium 3.5 - 5.1 mmol/L 4.9 4.3 4.5  Chloride 98 - 111 mmol/L 97(L) 96(L) 94(L)  CO2 22 - 32 mmol/L 31 29 26   Calcium 8.9 - 10.3 mg/dL 8.1(L) 8.4(L) 8.5(L)         Physical Exam: General: The patient is alert and oriented x3 in no acute distress.   Dermatology: Dry stable gangrene noted to the distal tip of the left hallux and dorsal distal portion of the left second toe.  There is no drainage.  This appears very stable at the moment.  Please see above photos  Vascular: VAS [place] ABI W/WO TBI 08/24/2021 RT BKA.  Left: Resting left ankle-brachial index is within normal range. No  evidence of significant left lower extremity arterial disease. The left  toe-brachial index is abnormal.   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 -Simply observe for now.  No dressings for the moment -Podiatry will sign off for now.    Thank you for the consult.  Please contact me directly with any questions or concerns.  Cell [phone]   Edrick Kins, DPM [facility]  Dr. Edrick Kins, DPM    [address], [place] [address]                Office [phone]  Fax [phone]

## 2021-09-26 NOTE — Procedures (Signed)
Patient Name: Ernest [name]  MRN: [id number]  Epilepsy Attending: Lora Havens  Referring Physician/Provider: Dr Kerney Elbe Date: 09/26/2021 Duration: 23.22 mins  Patient history: 75yo M with ams and speech disturbance. EEG to evaluate for seizure  Level of alertness: Awake  AEDs during EEG study: LEV  Technical aspects: This EEG study was done with scalp electrodes positioned according to the 10-20 International system of electrode placement. Electrical activity was acquired at a sampling rate of 500Hz  and reviewed with a high frequency filter of 70Hz  and a low frequency filter of 1Hz . EEG data were recorded continuously and digitally stored.   Description: The posterior dominant rhythm consists of 8 Hz activity of moderate voltage (25-35 uV) seen predominantly in posterior head regions, symmetric and reactive to eye opening and eye closing. Physiologic photic driving was not seen during photic stimulation.  Hyperventilation was not performed.     IMPRESSION: This study is within normal limits. No seizures or epileptiform discharges were seen throughout the recording.  Sybella Harnish Barbra Sarks

## 2021-09-26 NOTE — Progress Notes (Signed)
Follow up for me serving this patient and his family, from yesterday 09/25/2021 in ED ans well as previous hospitalization. Today response was to complete Advanced Directive. After reviewing chart ( with my limited medical knowledge) I assumed patient did not have capacity based on chart notes from medical team. After relaying to daughter this document has to be patient driven at the same time providing the education piece. After lengthy conversation with daughter and patient along with staff, I am far more comfortable with advancing this request, with notary making final decision. I also had the conversation with daughter about code status and how this would aide in those decisions. I did notice the code status change which daughter was not aware of. Miranda the nurse for the day will contact attending to speak with patient and daughter.

## 2021-09-27 ENCOUNTER — Encounter: Payer: Self-pay | Admitting: Family Medicine

## 2021-09-27 DIAGNOSIS — Z515 Encounter for palliative care: Secondary | ICD-10-CM

## 2021-09-27 DIAGNOSIS — Z7189 Other specified counseling: Secondary | ICD-10-CM

## 2021-09-27 LAB — HEMOGLOBIN A1C
Hgb A1c MFr Bld: 7 % — ABNORMAL HIGH (ref 4.8–5.6)
Mean Plasma Glucose: 154 mg/dL

## 2021-09-27 LAB — RESP PANEL BY RT-PCR (FLU A&B, COVID) ARPGX2
Influenza A by PCR: NEGATIVE
Influenza B by PCR: NEGATIVE
SARS Coronavirus 2 by RT PCR: NEGATIVE

## 2021-09-27 LAB — BASIC METABOLIC PANEL
Anion gap: 11 (ref 5–15)
BUN: 56 mg/dL — ABNORMAL HIGH (ref 8–23)
CO2: 26 mmol/L (ref 22–32)
Calcium: 8 mg/dL — ABNORMAL LOW (ref 8.9–10.3)
Chloride: 97 mmol/L — ABNORMAL LOW (ref 98–111)
Creatinine, Ser: 6.62 mg/dL — ABNORMAL HIGH (ref 0.61–1.24)
GFR, Estimated: 8 mL/min — ABNORMAL LOW (ref >60)
Glucose, Bld: 98 mg/dL (ref 70–99)
Potassium: 5.7 mmol/L — ABNORMAL HIGH (ref 3.5–5.1)
Sodium: 134 mmol/L — ABNORMAL LOW (ref 135–145)

## 2021-09-27 LAB — HEPATITIS B SURFACE ANTIBODY,QUALITATIVE: Hep B S Ab: REACTIVE — AB

## 2021-09-27 LAB — HEPATITIS B SURFACE ANTIBODY, QUANTITATIVE: Hep B S AB Quant (Post): 626.5 m[IU]/mL (ref >9.9)

## 2021-09-27 LAB — VITAMIN B1: Vitamin B1 (Thiamine): 148.2 nmol/L (ref 66.5–200.0)

## 2021-09-27 MED ORDER — HEPARIN SODIUM (PORCINE) 1000 UNIT/ML IJ SOLN
INTRAMUSCULAR | Status: AC
  Filled 2021-09-27: qty 10

## 2021-09-27 NOTE — Progress Notes (Signed)
OT Cancellation Note  Patient Details Name: Ernest [name] MRN: [id number] DOB: 14-Dec-1945   Cancelled Treatment:    Reason Eval/Treat Not Completed: Patient at procedure or test/ unavailable. Pt off floor for HD at this time. OT to re-attempt at later time/day as able.  Fredirick Maudlin, OTR/L [place]

## 2021-09-27 NOTE — TOC Initial Note (Signed)
Transition of Care [facility]) - Initial/Assessment Note    Patient Details  Name: Ernest [name] MRN: [id number] Date of Birth: 1946/04/06  Transition of Care [facility]) CM/SW Contact:    Eileen Stanford, LCSW Phone Number: 09/27/2021, 9:00 AM  Clinical Narrative:   CSW spoke with pt's daughter [name] and confirmed that pt is from [facility] and the plan is for pt to return at d/c. CSW will send referral to [facility].                Expected Discharge Plan: Skilled Nursing Facility Barriers to Discharge: Continued Medical Work up   Patient Goals and CMS Choice Patient states their goals for this hospitalization and ongoing recovery are:: for pt to return to [facility] [employer]   Choice offered to / list presented to : Adult Children  Expected Discharge Plan and Services Expected Discharge Plan: Major In-house Referral: Clinical Social Work   Post Acute Care Choice: [place] Living arrangements for the past 2 months: [place]                                      Prior Living Arrangements/Services Living arrangements for the past 2 months: [place] Lives with:: Facility Resident Patient language and need for interpreter reviewed:: Yes Do you feel safe going back to the place where you live?: Yes      Need for Family Participation in Patient Care: Yes (Comment) Care giver support system in place?: Yes (comment)   Criminal Activity/Legal Involvement Pertinent to Current Situation/Hospitalization: No - Comment as needed  Activities of Daily Living Home Assistive Devices/Equipment: Wheelchair ADL Screening (condition at time of admission) Patient's cognitive ability adequate to safely complete daily activities?: No Is the patient deaf or have difficulty hearing?: No Does the patient have difficulty seeing, even when wearing glasses/contacts?: No Does the patient have difficulty concentrating,  remembering, or making decisions?: Yes Patient able to express need for assistance with ADLs?: Yes Does the patient have difficulty dressing or bathing?: Yes Independently performs ADLs?: No Communication: Independent Dressing (OT): Needs assistance Is this a change from baseline?: Pre-admission baseline Grooming: Needs assistance Bathing: Needs assistance Toileting: Needs assistance Is this a change from baseline?: Pre-admission baseline In/Out Bed: Needs assistance Does the patient have difficulty walking or climbing stairs?: No Weakness of Legs: Left Weakness of Arms/Hands: None  Permission Sought/Granted Permission sought to share information with : Family Supports Permission granted to share information with : Yes, Release of Information Signed  Share Information with NAME: heather  Permission granted to share info w AGENCY: [facility]  Permission granted to share info w Relationship: daughter     Emotional Assessment Appearance:: Appears stated age Attitude/Demeanor/Rapport: Unable to Assess Affect (typically observed): Unable to Assess Orientation: : Oriented to Self, Oriented to Place Alcohol / Substance Use: Not Applicable Psych Involvement: No (comment)  Admission diagnosis:  TIA (transient ischemic attack) [G45.9] Slurred speech [R47.81] Chronic respiratory failure with hypoxia (HCC) [J96.11] Acute encephalopathy [G93.40] Patient Active Problem List   Diagnosis Date Noted   Osteomyelitis of right foot ([place])    Acquired absence of right leg above knee ([place]) 07/21/2021   Pressure injury of skin 07/19/2021   Pressure ulcer 07/16/2021   Hypotension    Wet gangrene ([place]) 07/10/2021   S/P AKA (above knee amputation), right ([place]) 07/10/2021   PAD (  peripheral artery disease) ([place]) 07/08/2021   Sepsis ([place]) 06/27/2021   Bacterial infection, unspecified 03/31/2021   Contact with and (suspected) exposure to covid-19 03/13/2021   COVID-19 03/07/2021   Disorder of  phosphorus metabolism, unspecified 02/20/2021   Hypercalcemia 12/08/2020   Atherosclerotic heart disease of native coronary artery without angina pectoris 11/18/2020   Diarrhea, unspecified 11/18/2020   Fever, unspecified 11/18/2020   Other fluid overload 11/18/2020   Pain, unspecified 11/18/2020   Pleural effusion on left 09/30/2019   Acute respiratory failure with hypoxia (HCC) 09/30/2019   Pleural effusion 09/30/2019   Chest pain 12/02/2018   Vascular dementia ([place]) 10/10/2017   Paroxysmal atrial flutter ([place]) 10/10/2017   Altered mental status, unspecified 10/09/2017   Other malaise 10/09/2017   S/P CABG x 4 10/04/2017   Cerebral embolism with cerebral infarction 09/20/2017   NSVT (nonsustained ventricular tachycardia)    Dyspnea 09/08/2017   Non-ST elevation MI (NSTEMI) ([place])    Acute on chronic combined systolic and diastolic CHF (congestive heart failure) ([place])    End-stage renal disease on hemodialysis ([place])    Left leg weakness    TIA (transient ischemic attack) 10/27/2016   Dependence on renal dialysis ([place]) 08/09/2015   History of CVA with residual deficit 08/07/2015   Seizure, late effect of stroke ([place]) 08/07/2015   Diabetic peripheral neuropathy ([place]) 08/07/2015   Unspecified protein-calorie malnutrition ([place]) 05/27/2015   Seizure ([place]) 04/29/2015   Coagulation defect, unspecified ([place]) 07/16/2014   Type 2 diabetes mellitus with diabetic peripheral angiopathy without gangrene ([place]) 07/01/2014   Pruritus, unspecified 05/26/2014   Elevated prostate specific antigen (PSA) 08/03/2013   Encounter for immunization 07/10/2013   Obstructive sleep apnea 06/16/2013   Hyperlipidemia associated with type 2 diabetes mellitus ([place])    ESRD (end stage renal disease) ([place])    Erectile dysfunction associated with type 2 diabetes mellitus ([place])    Hypertension due to end stage renal disease caused by type 2 diabetes mellitus, on dialysis ([place])    COPD (chronic obstructive  pulmonary disease) ([place])    Chronic combined systolic and diastolic CHF (congestive heart failure) (HCC)    Anemia of chronic disease 12/27/2011   Long term (current) use of insulin ([place]) 11/03/2009   Gout, unspecified 02/28/2009   Iron deficiency anemia, unspecified 10/30/2008   Secondary hyperparathyroidism of renal origin ([place]) 10/30/2008   Type 2 diabetes mellitus with hyperglycemia, without long-term current use of insulin ([place]) 06/08/1984   PCP:  Patient, [name] (Inactive) Pharmacy:  No Pharmacies Listed    Social Determinants of Health (SDOH) Interventions    Readmission Risk Interventions Readmission Risk Prevention Plan 06/28/2021  Transportation Screening Complete  Medication Review (RN Care Manager) Complete  PCP or Specialist appointment within 3-5 days of discharge Complete  HRI or Home Care Consult Complete  SW Recovery Care/Counseling Consult Complete  Palliative Care Screening Not Applicable  Skilled Nursing Facility Complete  Some recent data might be hidden

## 2021-09-27 NOTE — Progress Notes (Signed)
[facility]  ROUNDING NOTE   Subjective:   Patient seen resting quietly in bed, alert and oriented Breakfast tray at bedside Received report from nursing and palliative that patient requested to discontinue dialysis.  When asked this morning patient stated he did not want to continue with dialysis.  When asked if he understood the gravity of his request and life limitations of this we will place, patient stated his understanding.  This conversation witnessed by bedside nurse and repeated with supervising physician at later time.  Received a chat later in the morning from primary team that patient decided to continue with dialysis.  This was confirmed when speaking with palliative care team, but patient would like to proceed with dialysis.  Objective:  Vital signs in last 24 hours:  Temp:  [97.9 F (36.6 C)-99.1 F (37.3 C)] 98 F (36.7 C) (12/14 1122) Pulse Rate:  [60-66] 62 (12/14 1122) Resp:  [15-17] 16 (12/14 0408) BP: (130-165)/(68-95) 142/68 (12/14 1122) SpO2:  [99 %-100 %] 100 % (12/14 1122) Weight:  [76 kg] 76 kg (12/14 0500)  Weight change: 2.177 kg Filed Weights   09/25/21 2319 09/26/21 0356 09/27/21 0500  Weight: 73.8 kg 73.8 kg 76 kg    Intake/Output: I/O last 3 completed shifts: In: 490.2 [P.O.:240; IV Piggyback:250.2] Out: 75 [Urine:75]   Intake/Output this shift:  Total I/O In: 360 [P.O.:360] Out: -   Physical Exam: General: NAD,   Head: Normocephalic, atraumatic. Moist oral mucosal membranes  Eyes: Anicteric  Lungs:  Clear to auscultation, normal effort, 2L [place]  Heart: Regular rate and rhythm  Abdomen:  Soft, nontender  Extremities:  no peripheral edema.  Neurologic: Nonfocal, moving all four extremities  Skin: No lesions  Access: Rt thigh Permcath    Basic Metabolic Panel: Recent Labs  Lab 09/25/21 1216 09/26/21 0613 09/27/21 0657  NA 134* 136 134*  K 4.3 4.9 5.7*  CL 96* 97* 97*  CO2 29 31 26   GLUCOSE 136* 75 98  BUN 38* 45*  56*  CREATININE 4.40* 5.25* 6.62*  CALCIUM 8.4* 8.1* 8.0*  PHOS  --  3.9  --      Liver Function Tests: Recent Labs  Lab 09/25/21 1216  AST 50*  ALT 49*  ALKPHOS 84  BILITOT 0.7  PROT 7.0  ALBUMIN 3.2*    No results for input(s): LIPASE, AMYLASE in the last 168 hours. No results for input(s): AMMONIA in the last 168 hours.  CBC: Recent Labs  Lab 09/25/21 1216 09/26/21 1219  WBC 7.9 6.4  NEUTROABS 5.2  --   HGB 11.5* 10.9*  HCT 34.6* 33.1*  MCV 84.6 84.7  PLT 186 180     Cardiac Enzymes: No results for input(s): CKTOTAL, CKMB, CKMBINDEX, TROPONINI in the last 168 hours.  BNP: Invalid input(s): POCBNP  CBG: Recent Labs  Lab 09/25/21 1140 09/25/21 2008  GLUCAP 132* 116*     Microbiology: Results for orders placed or performed during the hospital encounter of 06/26/21  Blood culture (routine x 2)     Status: None   Collection Time: 06/27/21 12:42 AM   Specimen: BLOOD  Result Value Ref Range Status   Specimen Description BLOOD RIGHT ASSIST CONTROL  Final   Special Requests   Final    BOTTLES DRAWN AEROBIC AND ANAEROBIC Blood Culture adequate volume   Culture   Final    NO GROWTH 5 DAYS Performed at [facility], [address] [place],  [address]    Report Status 07/02/2021  FINAL  Final  Resp Panel by RT-PCR (Flu A&B, Covid) Nasopharyngeal Swab     Status: None   Collection Time: 06/27/21 12:42 AM   Specimen: Nasopharyngeal Swab; Nasopharyngeal(NP) swabs in vial transport medium  Result Value Ref Range Status   SARS Coronavirus 2 by RT PCR NEGATIVE NEGATIVE Final    Comment: (NOTE) SARS-CoV-2 target nucleic acids are NOT DETECTED.  The SARS-CoV-2 RNA is generally detectable in upper respiratory specimens during the acute phase of infection. The lowest concentration of SARS-CoV-2 viral copies this assay can detect is 138 copies/mL. A negative result does not preclude SARS-Cov-2 infection and should not be used as the sole basis  for treatment or other patient management decisions. A negative result may occur with  improper specimen collection/handling, submission of specimen other than nasopharyngeal swab, presence of viral mutation(s) within the areas targeted by this assay, and inadequate number of viral copies(<138 copies/mL). A negative result must be combined with clinical observations, patient history, and epidemiological information. The expected result is Negative.  Fact Sheet for Patients:  EntrepreneurPulse.com.au  Fact Sheet for Healthcare Providers:  IncredibleEmployment.be  This test is no t yet approved or cleared by the [place] FDA and  has been authorized for detection and/or diagnosis of SARS-CoV-2 by FDA under an Emergency Use Authorization (EUA). This EUA will remain  in effect (meaning this test can be used) for the duration of the COVID-19 declaration under Section 564(b)(1) of the Act, 21 U.S.C.section 360bbb-3(b)(1), unless the authorization is terminated  or revoked sooner.       Influenza A by PCR NEGATIVE NEGATIVE Final   Influenza B by PCR NEGATIVE NEGATIVE Final    Comment: (NOTE) The Xpert Xpress SARS-CoV-2/FLU/RSV plus assay is intended as an aid in the diagnosis of influenza from Nasopharyngeal swab specimens and should not be used as a sole basis for treatment. Nasal washings and aspirates are unacceptable for Xpert Xpress SARS-CoV-2/FLU/RSV testing.  Fact Sheet for Patients: EntrepreneurPulse.com.au  Fact Sheet for Healthcare Providers: IncredibleEmployment.be  This test is not yet approved or cleared by the [place] FDA and has been authorized for detection and/or diagnosis of SARS-CoV-2 by FDA under an Emergency Use Authorization (EUA). This EUA will remain in effect (meaning this test can be used) for the duration of the COVID-19 declaration under Section 564(b)(1) of the Act, 21  U.S.C. section 360bbb-3(b)(1), unless the authorization is terminated or revoked.  Performed at [facility], [address] [place], [place] [address]   Blood culture (routine x 2)     Status: None   Collection Time: 06/27/21 12:43 AM   Specimen: BLOOD  Result Value Ref Range Status   Specimen Description BLOOD RIGHT FOREARM  Final   Special Requests   Final    BOTTLES DRAWN AEROBIC AND ANAEROBIC Blood Culture adequate volume   Culture   Final    NO GROWTH 5 DAYS Performed at [facility], [address] [place], [place] [address]    Report Status 07/02/2021 FINAL  Final  MRSA Next Gen by PCR, Nasal     Status: None   Collection Time: 06/28/21  5:31 AM   Specimen: Nasal Mucosa; Nasal Swab  Result Value Ref Range Status   MRSA by PCR Next Gen NOT DETECTED NOT DETECTED Final    Comment: (NOTE) The GeneXpert MRSA Assay (FDA approved for NASAL specimens only), is one component of a comprehensive MRSA colonization surveillance program. It is not intended to diagnose MRSA infection nor to  guide or monitor treatment for MRSA infections. Test performance is not FDA approved in patients less than 35 years old. Performed at [facility], [address] [place], [place] [address]   Resp Panel by RT-PCR (Flu A&B, Covid) Nasopharyngeal Swab     Status: None   Collection Time: 07/18/21  2:00 PM   Specimen: Nasopharyngeal Swab; Nasopharyngeal(NP) swabs in vial transport medium  Result Value Ref Range Status   SARS Coronavirus 2 by RT PCR NEGATIVE NEGATIVE Final    Comment: (NOTE) SARS-CoV-2 target nucleic acids are NOT DETECTED.  The SARS-CoV-2 RNA is generally detectable in upper respiratory specimens during the acute phase of infection. The lowest concentration of SARS-CoV-2 viral copies this assay can detect is 138 copies/mL. A negative result does not preclude SARS-Cov-2 infection and should not be used as the sole basis for treatment or other  patient management decisions. A negative result may occur with  improper specimen collection/handling, submission of specimen other than nasopharyngeal swab, presence of viral mutation(s) within the areas targeted by this assay, and inadequate number of viral copies(<138 copies/mL). A negative result must be combined with clinical observations, patient history, and epidemiological information. The expected result is Negative.  Fact Sheet for Patients:  EntrepreneurPulse.com.au  Fact Sheet for Healthcare Providers:  IncredibleEmployment.be  This test is no t yet approved or cleared by the [place] FDA and  has been authorized for detection and/or diagnosis of SARS-CoV-2 by FDA under an Emergency Use Authorization (EUA). This EUA will remain  in effect (meaning this test can be used) for the duration of the COVID-19 declaration under Section 564(b)(1) of the Act, 21 U.S.C.section 360bbb-3(b)(1), unless the authorization is terminated  or revoked sooner.       Influenza A by PCR NEGATIVE NEGATIVE Final   Influenza B by PCR NEGATIVE NEGATIVE Final    Comment: (NOTE) The Xpert Xpress SARS-CoV-2/FLU/RSV plus assay is intended as an aid in the diagnosis of influenza from Nasopharyngeal swab specimens and should not be used as a sole basis for treatment. Nasal washings and aspirates are unacceptable for Xpert Xpress SARS-CoV-2/FLU/RSV testing.  Fact Sheet for Patients: EntrepreneurPulse.com.au  Fact Sheet for Healthcare Providers: IncredibleEmployment.be  This test is not yet approved or cleared by the [place] FDA and has been authorized for detection and/or diagnosis of SARS-CoV-2 by FDA under an Emergency Use Authorization (EUA). This EUA will remain in effect (meaning this test can be used) for the duration of the COVID-19 declaration under Section 564(b)(1) of the Act, 21 U.S.C. section  360bbb-3(b)(1), unless the authorization is terminated or revoked.  Performed at [facility], [address] [place], [place] [address]     Coagulation Studies: Recent Labs    09/25/21 1216  LABPROT 14.4  INR 1.1     Urinalysis: No results for input(s): COLORURINE, LABSPEC, PHURINE, GLUCOSEU, HGBUR, BILIRUBINUR, KETONESUR, PROTEINUR, UROBILINOGEN, NITRITE, LEUKOCYTESUR in the last 72 hours.  Invalid input(s): APPERANCEUR    Imaging: MR BRAIN WO CONTRAST  Result Date: 09/25/2021 CLINICAL DATA:  Neuro deficit, stroke suspected EXAM: MRI HEAD WITHOUT CONTRAST TECHNIQUE: Multiplanar, multiecho pulse sequences of the brain and surrounding structures were obtained without intravenous contrast. COMPARISON:  No prior MRI, correlation is made with CT head 09/25/2021 FINDINGS: Evaluation is somewhat limited by motion artifact. Brain: No restricted diffusion to suggest acute or subacute infarct. No acute hemorrhage, mass, mass effect, or midline shift. No extra-axial collection or hydrocephalus. Confluent T2 hyperintense signal in the periventricular white matter, likely  the sequela of severe chronic small vessel ischemic disease. Lacunar infarcts in the bilateral corona radiata, thalami, and cerebellar hemispheres. Multiple punctate foci of susceptibility, which may represent sequela of hypertensive microhemorrhages. Vascular: Normal flow voids. Skull and upper cervical spine: Normal marrow signal. Sinuses/Orbits: Negative.  Status post bilateral lens replacements. Other: Trace fluid in left mastoid air cells. IMPRESSION: No acute intracranial process. Electronically Signed   By: Merilyn Baba M.D.   On: 09/25/2021 19:09   US Carotid Bilateral (at [facility] and [facility] only)  Result Date: 09/25/2021 CLINICAL DATA:  75 year old male with history of transient ischemic attack. EXAM: BILATERAL CAROTID DUPLEX ULTRASOUND TECHNIQUE: Pearline Cables scale imaging, color Doppler and duplex ultrasound were  performed of bilateral carotid and vertebral arteries in the neck. COMPARISON:  None. FINDINGS: Criteria: Quantification of carotid stenosis is based on velocity parameters that correlate the residual internal carotid diameter with NASCET-based stenosis levels, using the diameter of the distal internal carotid lumen as the denominator for stenosis measurement. The following velocity measurements were obtained: RIGHT ICA: Peak systolic velocity 36 cm/sec, End diastolic velocity 8 cm/sec CCA: Peak systolic velocity 42 cm/sec SYSTOLIC ICA/CCA RATIO:  0.9 ECA: Peak systolic velocity 47 cm/sec LEFT ICA: Peak systolic velocity 66 cm/sec, End diastolic velocity 17 cm/sec CCA: 48 cm/sec SYSTOLIC ICA/CCA RATIO:  1.4 ECA: 38 cm/sec RIGHT CAROTID ARTERY: Mild scattered atherosclerotic plaque formation. No significant tortuosity. Normal low resistance waveforms. RIGHT VERTEBRAL ARTERY:  Antegrade flow. LEFT CAROTID ARTERY: Mild scattered atherosclerotic plaque formation. No significant tortuosity. Normal low resistance waveforms. LEFT VERTEBRAL ARTERY:  Antegrade flow. Upper extremity non-invasive blood pressures: Not obtained. IMPRESSION: 1. Right carotid artery system: Less than 50% stenosis secondary to mild scattered atherosclerotic plaque formation. 2. Left carotid artery system: Less than 50% stenosis secondary to mild scattered atherosclerotic plaque formation. 3.  Vertebral artery system: Patent with antegrade flow bilaterally. Ruthann Cancer, MD [facility]iology Electronically Signed   By: Ruthann Cancer M.D.   On: 09/25/2021 16:58   DG Chest Portable 1 View  Result Date: 09/25/2021 CLINICAL DATA:  Hypoxia, dyspnea, dialysis patient EXAM: PORTABLE CHEST 1 VIEW COMPARISON:  06/27/2021 chest radiograph. FINDINGS: Mitral valve annuloplasty ring in place. Intact sternotomy wires. Low lung volumes. Inferior approach central venous catheter terminates at the inferior  cavoatrial junction. Stable cardiomediastinal silhouette with normal heart size. No pneumothorax. Small to moderate loculated peripheral left pleural effusion, unchanged. No right pleural effusion. Diffuse prominence of the perihilar interstitial markings with patchy left lung base opacity and volume loss. Chronic calcified left thyroid nodule, unchanged. IMPRESSION: 1. Low lung volumes. Diffuse prominence of the perihilar interstitial markings with patchy left lung base opacity and volume loss, favor a combination of pulmonary edema and atelectasis. 2. Stable small to moderate loculated peripheral left pleural effusion. Electronically Signed   By: Ilona Sorrel M.D.   On: 09/25/2021 12:34   EEG adult  Result Date: 09/26/2021 Lora Havens, MD     09/26/2021  2:34 PM Patient Name: Ernest [name] MRN: [id number] Epilepsy Attending: Lora Havens Referring Physician/Provider: Dr Kerney Elbe Date: 09/26/2021 Duration: 23.22 mins Patient history: 75yo M with ams and speech disturbance. EEG to evaluate for seizure Level of alertness: Awake AEDs during EEG study: LEV Technical aspects: This EEG study was done with scalp electrodes positioned according to the 10-20 International system of electrode placement. Electrical activity was acquired at a sampling rate of 500Hz  and reviewed with a high frequency filter of 70Hz  and a  low frequency filter of 1Hz . EEG data were recorded continuously and digitally stored. Description: The posterior dominant rhythm consists of 8 Hz activity of moderate voltage (25-35 uV) seen predominantly in posterior head regions, symmetric and reactive to eye opening and eye closing. Physiologic photic driving was not seen during photic stimulation.  Hyperventilation was not performed.   IMPRESSION: This study is within normal limits. No seizures or epileptiform discharges were seen throughout the recording. Priyanka Barbra Sarks     Medications:    sodium chloride     sodium chloride      thiamine injection 500 mg (09/27/21 0930)     stroke: mapping our early stages of recovery book   Does not apply Once   aspirin EC  81 mg Oral Daily   atorvastatin  40 mg Oral Daily   Chlorhexidine Gluconate Cloth  6 each Topical Daily   feeding supplement (NEPRO CARB STEADY)  237 mL Oral BID BM   heparin  5,000 Units Subcutaneous BID   levETIRAcetam  500 mg Oral Once per day on Mon Wed Fri   levETIRAcetam  500 mg Oral BID   mirtazapine  15 mg Oral QHS   multivitamin  1 tablet Oral QHS   pantoprazole  40 mg Oral Daily   polyethylene glycol  17 g Oral Daily   senna-docusate  1 tablet Oral QHS   sertraline  25 mg Oral QHS   sevelamer carbonate  800 mg Oral TID WC     Assessment/ Plan:  Mr. TALLIS SOLEDAD is a 75 y.o.  male  with medical problems of  ESRD, COPD, chronic systolic CHF, coronary artery disease status post CABG and mitral valve repair in 2018, depression, gout, hepatitis C, history of seizures, hyperlipidemia, stroke, history of diabetes, severe diffuse calcified atherosclerosis, chronic small vessel ischemic changes and history of indeterminate lacunar infarct in the brain  was admitted on 09/25/2021 for TIA (transient ischemic attack) [G45.9] Slurred speech [R47.81] Chronic respiratory failure with hypoxia (HCC) [J96.11] Acute encephalopathy [G93.40]  [facility] [address]/MWF/Rt groin Permcath   End stage renal disease on dialysis. Will maintain outpatient scheduled, if possible.  We will plan for dialysis today with low UF.  Next treatment scheduled for Friday.    2. Anemia of chronic kidney disease Lab Results  Component Value Date   HGB 10.9 (L) 09/26/2021    Hemoglobin remains at goal  3. Secondary Hyperparathyroidism:  Lab Results  Component Value Date   PTH 63 10/05/2017   PTH Comment 10/05/2017   CALCIUM 8.0 (L) 09/27/2021   CAION 1.05 (L) 07/12/2020   PHOS 3.9 09/26/2021    We will continue to monitor bone mineral metabolism during  admission  4. Hypertension with chronic kidney disease.  Currently receiving furosemide 60 mg IV twice daily.  Blood pressure 142/68    LOS: 2   12/14/202211:55 AM

## 2021-09-27 NOTE — Progress Notes (Addendum)
Visited with patient who is doing well today. I did not facilitate AD because no volunteers were available. Spoke with daughter who also had additional questions, will follow up. Follow up visit with Palliative care and daughter for goals of care. Patient has answered palliative and medical staff appropriately regarding medical decisions, wanting his daughter [name] to have the authority to make decisions.

## 2021-09-27 NOTE — Progress Notes (Signed)
PT Cancellation Note  Patient Details Name: Ernest [name] MRN: [id number] DOB: 07-Sep-1946   Cancelled Treatment:    Reason Eval/Treat Not Completed: Patient at procedure or test/unavailable. Patient off floor for HD at this time. Will re-attempt at later time/day as available.    [name] 09/27/2021, 2:22 PM

## 2021-09-27 NOTE — Progress Notes (Signed)
Hemodialysis patient known at [facility] [place] MWF 6:40am. Patient lives at [facility] and provides transportation to treatments. Please contact me with any dialysis placement concerns.

## 2021-09-27 NOTE — Progress Notes (Signed)
Pt with scheduled HD session, has left tunneled thigh catheter. Catheter functions poorly, elevated arterial and venous pressures resulting in lower BFR. Unmet targeted UF this session. Nephrology team aware. Fluid removal this session 924 ml. Report given to primary nurse, patient return to room.

## 2021-09-27 NOTE — Progress Notes (Signed)
[place] [facility] [place] NAME: Ernest [name]    MR#:  [id number]  DATE OF BIRTH:  11-27-1945  SUBJECTIVE:  patient seen earlier with daughter at bedside. Had some confusion her answered most questions appropriately. His disoriented to place and time. Patient knows if he does not continue dialysis he will not be able to survive. He told me he wants to continue now. Asked him not to keep changing his mind.  REVIEW OF SYSTEMS:   Review of Systems  Constitutional:  Negative for chills, fever and weight loss.  HENT:  Negative for ear discharge, ear pain and nosebleeds.   Eyes:  Negative for blurred vision, pain and discharge.  Respiratory:  Negative for sputum production, shortness of breath, wheezing and stridor.   Cardiovascular:  Negative for chest pain, palpitations, orthopnea and PND.  Gastrointestinal:  Negative for abdominal pain, diarrhea, nausea and vomiting.  Genitourinary:  Negative for frequency and urgency.  Musculoskeletal:  Negative for back pain and joint pain.  Neurological:  Positive for weakness. Negative for sensory change, speech change and focal weakness.  Psychiatric/Behavioral:  Negative for depression and hallucinations. The patient is not nervous/anxious.   Tolerating Diet: Tolerating PT:   DRUG ALLERGIES:  [name]  VITALS:  Blood pressure (!) 143/77, pulse 60, temperature 97.7 F (36.5 C), temperature source Oral, resp. rate (!) 24, weight 81.5 kg, SpO2 100 %.  PHYSICAL EXAMINATION:   Physical Exam  GENERAL:  75 y.o.-year-old patient lying in the bed with no acute distress. Obese chronically ill HEENT: Head atraumatic, normocephalic. Oropharynx and nasopharynx clear.  LUNGS: Normal breath sounds bilaterally, no wheezing, rales, rhonchi. No use of accessory muscles of respiration.  CARDIOVASCULAR: S1, S2 normal. No murmurs, rubs, or gallops.  ABDOMEN: Soft, nontender, nondistended. Bowel sounds present.  No organomegaly or mass.  EXTREMITIES: No cyanosis, clubbing or edema b/l.    NEUROLOGIC: nonfocal PSYCHIATRIC:  patient is alert  SKIN:  Pressure Injury 07/16/21 Sacrum Medial;Lower Stage 2 -  Partial thickness loss of dermis presenting as a shallow open injury with a red, pink wound bed without slough. (Active)  07/16/21 0430  Location: Sacrum  Location Orientation: Medial;Lower  Staging: Stage 2 -  Partial thickness loss of dermis presenting as a shallow open injury with a red, pink wound bed without slough.  Wound Description (Comments):   Present on Admission: Yes     Pressure Injury 09/27/21 Toe (Comment  which one) Left;Anterior;Upper Unstageable - Full thickness tissue loss in which the base of the injury is covered by slough (yellow, tan, gray, green or brown) and/or eschar (tan, brown or black) in the wound bed.  (Active)  09/27/21 0915  Location: Toe (Comment  which one)  Location Orientation: Left;Anterior;Upper  Staging: Unstageable - Full thickness tissue loss in which the base of the injury is covered by slough (yellow, tan, gray, green or brown) and/or eschar (tan, brown or black) in the wound bed.  Wound Description (Comments): Black unstageable wound to right great toe  Present on Admission: Yes       LABORATORY PANEL:  CBC Recent Labs  Lab 09/26/21 1219  WBC 6.4  HGB 10.9*  HCT 33.1*  PLT 180    Chemistries  Recent Labs  Lab 09/25/21 1216 09/26/21 0613 09/27/21 0657  NA 134*   < > 134*  K 4.3   < > 5.7*  CL 96*   < > 97*  CO2 29   < >  26  GLUCOSE 136*   < > 98  BUN 38*   < > 56*  CREATININE 4.40*   < > 6.62*  CALCIUM 8.4*   < > 8.0*  AST 50*  --   --   ALT 49*  --   --   ALKPHOS 84  --   --   BILITOT 0.7  --   --    < > = values in this interval not displayed.   Cardiac Enzymes No results for input(s): TROPONINI in the last 168 hours. RADIOLOGY:  MR BRAIN WO CONTRAST  Result Date: 09/25/2021 CLINICAL DATA:  Neuro deficit, stroke  suspected EXAM: MRI HEAD WITHOUT CONTRAST TECHNIQUE: Multiplanar, multiecho pulse sequences of the brain and surrounding structures were obtained without intravenous contrast. COMPARISON:  No prior MRI, correlation is made with CT head 09/25/2021 FINDINGS: Evaluation is somewhat limited by motion artifact. Brain: No restricted diffusion to suggest acute or subacute infarct. No acute hemorrhage, mass, mass effect, or midline shift. No extra-axial collection or hydrocephalus. Confluent T2 hyperintense signal in the periventricular white matter, likely the sequela of severe chronic small vessel ischemic disease. Lacunar infarcts in the bilateral corona radiata, thalami, and cerebellar hemispheres. Multiple punctate foci of susceptibility, which may represent sequela of hypertensive microhemorrhages. Vascular: Normal flow voids. Skull and upper cervical spine: Normal marrow signal. Sinuses/Orbits: Negative.  Status post bilateral lens replacements. Other: Trace fluid in left mastoid air cells. IMPRESSION: No acute intracranial process. Electronically Signed   By: Merilyn Baba M.D.   On: 09/25/2021 19:09   US Carotid Bilateral (at [facility] and [facility] only)  Result Date: 09/25/2021 CLINICAL DATA:  75 year old male with history of transient ischemic attack. EXAM: BILATERAL CAROTID DUPLEX ULTRASOUND TECHNIQUE: Pearline Cables scale imaging, color Doppler and duplex ultrasound were performed of bilateral carotid and vertebral arteries in the neck. COMPARISON:  None. FINDINGS: Criteria: Quantification of carotid stenosis is based on velocity parameters that correlate the residual internal carotid diameter with NASCET-based stenosis levels, using the diameter of the distal internal carotid lumen as the denominator for stenosis measurement. The following velocity measurements were obtained: RIGHT ICA: Peak systolic velocity 36 cm/sec, End diastolic velocity 8 cm/sec CCA: Peak systolic velocity 42 cm/sec SYSTOLIC ICA/CCA RATIO:  0.9 ECA:  Peak systolic velocity 47 cm/sec LEFT ICA: Peak systolic velocity 66 cm/sec, End diastolic velocity 17 cm/sec CCA: 48 cm/sec SYSTOLIC ICA/CCA RATIO:  1.4 ECA: 38 cm/sec RIGHT CAROTID ARTERY: Mild scattered atherosclerotic plaque formation. No significant tortuosity. Normal low resistance waveforms. RIGHT VERTEBRAL ARTERY:  Antegrade flow. LEFT CAROTID ARTERY: Mild scattered atherosclerotic plaque formation. No significant tortuosity. Normal low resistance waveforms. LEFT VERTEBRAL ARTERY:  Antegrade flow. Upper extremity non-invasive blood pressures: Not obtained. IMPRESSION: 1. Right carotid artery system: Less than 50% stenosis secondary to mild scattered atherosclerotic plaque formation. 2. Left carotid artery system: Less than 50% stenosis secondary to mild scattered atherosclerotic plaque formation. 3.  Vertebral artery system: Patent with antegrade flow bilaterally. Ernest Cancer, MD [facility] Radiology Electronically Signed   By: Ernest [name] M.D.   On: 09/25/2021 16:58   EEG adult  Result Date: 09/26/2021 Ernest Havens, MD     09/26/2021  2:34 PM Patient Name: Ernest [name] MRN: [id number] Epilepsy Attending: Lora [name] Referring Physician/Provider: Dr Kerney Elbe Date: 09/26/2021 Duration: 23.22 mins Patient history: 75yo M with ams and speech disturbance. EEG to evaluate for seizure Level of alertness: Awake AEDs during EEG study: LEV Technical aspects: This EEG  study was done with scalp electrodes positioned according to the 10-20 International system of electrode placement. Electrical activity was acquired at a sampling rate of 500Hz  and reviewed with a high frequency filter of 70Hz  and a low frequency filter of 1Hz . EEG data were recorded continuously and digitally stored. Description: The posterior dominant rhythm consists of 8 Hz activity of moderate voltage (25-35 uV) seen predominantly in posterior head regions, symmetric and  reactive to eye opening and eye closing. Physiologic photic driving was not seen during photic stimulation.  Hyperventilation was not performed.   IMPRESSION: This study is within normal limits. No seizures or epileptiform discharges were seen throughout the recording. Priyanka Barbra Sarks   ASSESSMENT AND PLAN:  Ernest [name] is a 75 y.o. male with medical history significant for COPD, systolic CHF, coronary artery disease, depression,, s/p right AKA, CVA with residual dysarthria end-stage renal disease on hemodialysis who was noted to have worsening slurred speech while having his dialysis session today with worsening confusion.  He was then brought to the ER and code stroke was activated.  Slurred speech resolved. Stroke ruled out. MRI without acute finding EEG negative mental status at baseline carotid ultrasound no significant stenosis echo done recently 9/22 year 72 to 55% continue aspirin patient seen by neurology Per Dr Cheral Marker "These findings include severe chronic small vessel ischemic disease, lacunar infarcts in the bilateral corona radiata, thalami, and cerebellar hemispheres, and multiple punctate foci of susceptibility, which may represent sequela of hypertensive microhemorrhages"  ESRB on hemodialysis Hyperkalemia -- scheduled for dialysis today. -- Patient had discussed with Dr Kurtis Bushman that he wanted to discontinue dialysis. -- Palliative care consultation placed. Discussed at length with patient and daughter [name] at bedside. Resume hemodialysis for now. Palliative care will follow outpatient for goals of care.   Paroxysmal a fib -- continue amiodarone  seizure disorder -- continue Keppra -- EEG negative  Depression -- on Zoloft    Procedures: Family communication : daughter [name] at bedside Consults : neurology, nephrology CODE STATUS: DNR DVT Prophylaxis : heparin Level of care: Med-Surg Status is: Inpatient  Remains inpatient appropriate because: TOC for  d/c  patient will return back to [facility] if remains stable tomorrow        TOTAL TIME TAKING CARE OF THIS PATIENT: 30 minutes.  >50% time spent on counselling and coordination of care  Note: This dictation was prepared with Dragon dictation along with smaller phrase technology. Any transcriptional errors that result from this process are unintentional.  Fritzi Mandes M.D    [facility]   CC: Primary care physician; Patient, [name] (Inactive) Patient ID: Ernest [name], male   DOB: May 18, 1946, 75 y.o.   MRN: [id number]

## 2021-09-27 NOTE — Consult Note (Signed)
Consultation Note Date: 09/27/2021   Patient Name: Ernest [name]  DOB: [birth year]  MRN: [id number]  Age / Sex: 75 y.o., male  PCP: Patient, [name] (Inactive) Referring Physician: Fritzi Mandes, MD  Reason for Consultation: Establishing goals of care  HPI/Patient Profile: 75 y.o. male  with past medical history of ESRD on HD, COPD, systolic heart failure, CAD, depression, right AKA, CVA with residual dysarthria, iron deficiency anemia, OSA, history of bacteremia, gout, history of hep C, HTN/HLD, mitral valve disease with repair 2018, history of DVT in right arm admitted on 09/25/2021 with TIA.   Clinical Assessment and Goals of Care: I have reviewed medical records including EPIC notes, labs and imaging, received report from RN, assessed the patient.  Ernest [name] is lying quietly in bed.  He is resting comfortably, but will open his eyes when I speak to him.  He is alert, oriented to person and situation, but not time.  I believe that he can make his basic needs known.  There is no family at bedside at this time.   He tells me that would continue dialysis.  I asked what would happen if he stopped to dialysis for 2 weeks.  He turns to face me, looking me square in the eye and states, "I would die".    Ask about healthcare power of attorney.  Ernest [name] tells me that he has a daughter Ernest [name] and a daughter Ernest [name] who lives in [place].  He tells me that his preference is for his daughter, Ernest [name], to be his healthcare surrogate.  I return later in the afternoon to find daughter Ernest [name] and chaplain services at bedside.  I introduced Palliative Medicine as specialized medical care for people living with serious illness. It focuses on providing relief from the symptoms and stress of a serious illness. The goal is to improve quality of life for both the patient and the family.  We met at the bedside to discuss  diagnosis prognosis, GOC, EOL wishes, disposition and options.  We talked about Ernest [name] acute and chronic health concerns including, but not limited to end-stage renal disease and TIA.  At this point, Ernest [name] is alert and oriented x3.  He continues to share that he would like to continue with hemodialysis.  I share with Ernest [name] that it is not uncommon for people and Ernest [name] circumstance to wax and wane in their desire to take dialysis.  I share that it would not surprise me that if again states he would not accept hemodialysis but then change his mind.   Advanced directives, concepts specific to code status, were considered and discussed.  We talked about the concept of "treat the treatable, but allowing natural death".  At this point daughter Ernest [name] states that she understands that treat the treatable/DNR would be a good way to care for her father.  Palliative Care services outpatient were explained and offered by attending.  Ernest [name] is to return to [facility] with outpatient palliative services.  Discussed the importance  of continued conversation with family and the medical providers regarding overall plan of care and treatment options, ensuring decisions are within the context of the patients values and GOCs. Questions and concerns were addressed.  The family was encouraged to call with questions or concerns.  PMT will continue to support holistically.  Conference with attending, nephrology, bedside nursing staff, transition of care team related to patient condition, needs, goals of care, disposition.   HCPOA  NEXT OF KIN -Ernest [name] names his daughter, Ernest [name], as his healthcare surrogate.  He has 2 children, daughters Ernest [name] and Museum/gallery conservator.  His wife died approximately 2 months ago.    SUMMARY OF RECOMMENDATIONS   At this point continue to treat the treatable but no CPR or intubation. Agreeable to continue with dialysis at this time. Return to [facility]  under long-term care Outpatient palliative services to follow   Code Status/Advance Care Planning: DNR  Symptom Management:  Per hospitalist, no additional needs at this time.  Palliative Prophylaxis:  Frequent Pain Assessment and Palliative Wound Care  Additional Recommendations (Limitations, Scope, Preferences): At this point continue to treat the treatable but no CPR or intubation.  Psycho-social/Spiritual:  Desire for further Chaplaincy support:no Additional Recommendations: Caregiving  Support/Resources and Education on Hospice  Prognosis:  Unable to determine, based on outcomes.  If Ernest [name] and his family elects no further hemodialysis 2 weeks or less would be anticipated  Discharge Planning:  To be determined, based on outcomes.       Primary Diagnoses: Present on Admission:  TIA (transient ischemic attack)   I have reviewed the medical record, interviewed the patient and family, and examined the patient. The following aspects are pertinent.  Past Medical History:  Diagnosis Date   Abnormal stress test    s/p cath November 2013 with modest disease involving the ostial left main, proximal LAD, proximal RCA - do not appear to be hemodynamically signficant; mild LV dysfunction   Anemia    low iron   Anxiety    Arthritis    Bacteremia    Chronic systolic CHF (congestive heart failure) (HCC)    COPD (chronic obstructive pulmonary disease) ([place])    Coronary artery disease    a. per cath report 2013, b. 09/13/2017-CABG X4 & Mitral valve repair   Depression    DVT (deep venous thrombosis) (HCC)    arm - right- finished blood thinner- not sure when it   Ejection fraction < 50%    Erectile dysfunction    penile implant   ESRD (end stage renal disease) ([place])    [facility] on a MWF schedule.      Gout    Hx: of   Hepatitis C    History of seizures    last one 2018   Hyperlipidemia    Hypertension    Mitral valve disease    s/p Mitral repair 09/13/17    Obesity    Retinopathy    Shortness of breath    Hx: of with exertion   Stroke [facility])    "several" left side weakness, speech affected   Type II or unspecified type diabetes mellitus without mention of complication, not stated as uncontrolled    adult onset   Social History   Socioeconomic History   Marital status: Widowed    Spouse name: Not on file   Number of children: Not on file   Years of education: Not on file   Highest education level: Not on file  Occupational  History   Occupation: Cabin crew: DISABLED    Comment: disabled  Tobacco Use   Smoking status: Former    Years: 7.00    Types: Cigarettes    Quit date: 06/10/1973    Years since quitting: 48.3   Smokeless tobacco: Never   Tobacco comments:    Quit in 1974.  Vaping Use   Vaping Use: Never used  Substance and Sexual Activity   Alcohol use: No    Alcohol/week: 0.0 standard drinks   Drug use: No   Sexual activity: Not on file  Other Topics Concern   Not on file  Social History Narrative   Adopted mentally retarded child at home, 2 adopted children, 3 living and 2 deceased.   Social Determinants of Health   Financial Resource Strain: Not on file  Food Insecurity: Not on file  Transportation Needs: Not on file  Physical Activity: Not on file  Stress: Not on file  Social Connections: Not on file   Family History  Problem Relation Age of Onset   Heart disease Father    Diabetes Sister    Diabetes Brother    Diabetes Son    Scheduled Meds:   stroke: mapping our early stages of recovery book   Does not apply Once   aspirin EC  81 mg Oral Daily   atorvastatin  40 mg Oral Daily   Chlorhexidine Gluconate Cloth  6 each Topical Daily   feeding supplement (NEPRO CARB STEADY)  237 mL Oral BID BM   heparin  5,000 Units Subcutaneous BID   levETIRAcetam  500 mg Oral Once per day on Mon Wed Fri   levETIRAcetam  500 mg Oral BID   mirtazapine  15 mg Oral QHS   multivitamin  1 tablet Oral QHS    pantoprazole  40 mg Oral Daily   polyethylene glycol  17 g Oral Daily   senna-docusate  1 tablet Oral QHS   sertraline  25 mg Oral QHS   sevelamer carbonate  800 mg Oral TID WC   Continuous Infusions:  sodium chloride     sodium chloride     thiamine injection 500 mg (09/27/21 0930)   PRN Meds:.sodium chloride, sodium chloride, alteplase, heparin, lidocaine (PF), lidocaine-prilocaine, pentafluoroprop-tetrafluoroeth Medications Prior to Admission:  Prior to Admission medications   Medication Sig Start Date End Date Taking? Authorizing Provider  acetaminophen (TYLENOL) 500 MG tablet Take 1,000 mg by mouth every 6 (six) hours as needed for mild pain.   Yes [provider]  levETIRAcetam (KEPPRA) 500 MG tablet Take 500 mg by mouth 2 (two) times daily. Only on Tuesday, Thursday, Saturday, Sunday   Yes [provider]  levETIRAcetam (KEPPRA) 500 MG tablet Take 500 mg by mouth 3 (three) times daily. On MWF - administer lunchtime dose once returns from dialysis   Yes [provider]  pantoprazole (PROTONIX) 40 MG tablet Take 1 tablet (40 mg total) by mouth daily. 07/21/21  Yes Swayze, Ava, DO  polyethylene glycol (MIRALAX / GLYCOLAX) 17 g packet Take 17 g by mouth daily. Dissolve in 4-8 ounces of water   Yes [provider]  senna-docusate (SENOKOT-S) 8.6-50 MG tablet Take 1 tablet by mouth at bedtime.   Yes [provider]  sertraline (ZOLOFT) 25 MG tablet Take 25 mg by mouth at bedtime.   Yes [provider]  sevelamer carbonate (RENVELA) 800 MG tablet Take 800 mg by mouth 3 (three) times daily with meals. On Monday, Wednesday,  and Friday. Give NOON dose on MWF asap when returning from dialysis   Yes [provider]  sevelamer carbonate (RENVELA) 800 MG tablet Take 800 mg by mouth 3 (three) times daily with meals. On Tuesday, Thursday, Saturday, and Sunday (non-dialysis days)   Yes [provider]  allopurinol (ZYLOPRIM) 100  MG tablet Take 100 mg by mouth daily. Patient not taking: Reported on 09/25/2021    [provider]  Amino Acids-Protein Hydrolys (FEEDING SUPPLEMENT, PRO-STAT 64,) LIQD Take 30 mLs by mouth 3 (three) times daily with meals.    [provider]  amiodarone (PACERONE) 200 MG tablet Take 200 mg by mouth daily. Patient not taking: Reported on 09/25/2021    [provider]  aspirin EC 81 MG tablet Take 1 tablet (81 mg total) by mouth daily. Patient not taking: Reported on 09/25/2021 11/29/15   Rivet, Sindy Guadeloupe, MD  atorvastatin (LIPITOR) 40 MG tablet Take 40 mg by mouth at bedtime. Patient not taking: Reported on 09/25/2021    [provider]  docusate sodium (COLACE) 100 MG capsule Take 100 mg by mouth daily. Patient not taking: Reported on 09/25/2021    [provider]  midodrine (PROAMATINE) 10 MG tablet Take 1 tablet (10 mg total) by mouth 4 (four) times daily. Patient not taking: Reported on 09/25/2021 07/20/21   Swayze, Ava, DO  mirtazapine (REMERON) 15 MG tablet Take 1 tablet (15 mg total) by mouth at bedtime. Patient not taking: Reported on 09/25/2021 07/20/21   Swayze, Ava, DO  Nutritional Supplements (FEEDING SUPPLEMENT, NEPRO CARB STEADY,) LIQD Take 237 mLs by mouth 2 (two) times daily between meals. 10/07/17   Jadene Pierini E, [name]   [name] Review of Systems  Unable to perform ROS: Other   Physical Exam Vitals and nursing note reviewed.  Constitutional:      General: He is not in acute distress.    Appearance: He is ill-appearing.  HENT:     Mouth/Throat:     Mouth: Mucous membranes are dry.  Cardiovascular:     Rate and Rhythm: Normal rate.  Pulmonary:     Effort: Pulmonary effort is normal. No respiratory distress.  Musculoskeletal:     Comments: Wounds on toes as noted in chart  Skin:    General: Skin is warm and dry.  Neurological:     Mental Status: He is alert and oriented to person, place, and time.  Psychiatric:         Mood and Affect: Mood normal.        Behavior: Behavior normal.    Vital Signs: BP (!) 142/68 (BP Location: Right Arm)    Pulse 62    Temp 98 F (36.7 C) (Oral)    Resp 15    Wt 76 kg    SpO2 100%    BMI 24.74 kg/m  Pain Scale: 0-10   Pain Score: 0-No pain   SpO2: SpO2: 100 % O2 Device:SpO2: 100 % O2 Flow Rate: .O2 Flow Rate (L/min): 2 L/min  IO: Intake/output summary:  Intake/Output Summary (Last 24 hours) at 09/27/2021 1310 Last data filed at 09/27/2021 1010 Gross per 24 hour  Intake 700 ml  Output --  Net 700 ml    LBM: Last BM Date: 09/26/21 Baseline Weight: Weight: 73.8 kg Most recent weight: Weight: 76 kg     Palliative Assessment/Data:   Flowsheet Rows    Flowsheet Row Most Recent Value  Intake Tab   Referral Department Hospitalist  Unit at  Time of Referral Intermediate Care Unit  Palliative Care Primary Diagnosis Nephrology  Date Notified 09/26/21  Palliative Care Type New Palliative care  Reason for referral Clarify Goals of Care  Date of Admission 09/25/21  Date first seen by Palliative Care 09/27/21  # of days Palliative referral response time 1 Day(s)  # of days IP prior to Palliative referral 1  Clinical Assessment   Palliative Performance Scale Score 40%  Pain Max last 24 hours Not able to report  Pain Min Last 24 hours Not able to report  Dyspnea Max Last 24 Hours Not able to report  Dyspnea Min Last 24 hours Not able to report  Psychosocial & Spiritual Assessment   Palliative Care Outcomes        Time In: 1300 Time Out: 1410 Time Total: 70 minutes Greater than 50%  of this time was spent counseling and coordinating care related to the above assessment and plan.  Signed by: Drue Novel, NP   Please contact Palliative Medicine Team phone at [phone] for questions and concerns.  For individual provider: See Shea Evans

## 2021-09-27 NOTE — NC FL2 (Signed)
[place] LEVEL OF CARE SCREENING TOOL     IDENTIFICATION  Patient Name: Ernest [name] Birthdate: 1946-07-30 Sex: male Admission Date (Current Location): 09/25/2021  [facility] and [place] Number:  Engineering geologist and Address:  [facility], [address], [place], [place] [address]      Provider Number: [id number]  Attending Physician Name and Address:  Fritzi Mandes, MD  Relative Name and Phone Number:       Current Level of Care: Hospital Recommended Level of Care: [place] Prior Approval Number:    Date Approved/Denied:   PASRR Number:    Discharge Plan: SNF    Current Diagnoses: Patient Active Problem List   Diagnosis Date Noted   Osteomyelitis of right foot [facility])    Acquired absence of right leg above knee ([place]) 07/21/2021   Pressure injury of skin 07/19/2021   Pressure ulcer 07/16/2021   Hypotension    Wet gangrene ([place]) 07/10/2021   S/P AKA (above knee amputation), right ([place]) 07/10/2021   PAD (peripheral artery disease) ([place]) 07/08/2021   Sepsis ([place]) 06/27/2021   Bacterial infection, unspecified 03/31/2021   Contact with and (suspected) exposure to covid-19 03/13/2021   COVID-19 03/07/2021   Disorder of phosphorus metabolism, unspecified 02/20/2021   Hypercalcemia 12/08/2020   Atherosclerotic heart disease of native coronary artery without angina pectoris 11/18/2020   Diarrhea, unspecified 11/18/2020   Fever, unspecified 11/18/2020   Other fluid overload 11/18/2020   Pain, unspecified 11/18/2020   Pleural effusion on left 09/30/2019   Acute respiratory failure with hypoxia ([place]) 09/30/2019   Pleural effusion 09/30/2019   Chest pain 12/02/2018   Vascular dementia ([place]) 10/10/2017   Paroxysmal atrial flutter ([place]) 10/10/2017   Altered mental status, unspecified 10/09/2017   Other malaise 10/09/2017   S/P CABG x 4 10/04/2017   Cerebral embolism with cerebral infarction 09/20/2017   NSVT  (nonsustained ventricular tachycardia)    Dyspnea 09/08/2017   Non-ST elevation MI (NSTEMI) ([place])    Acute on chronic combined systolic and diastolic CHF (congestive heart failure) ([place])    End-stage renal disease on hemodialysis ([place])    Left leg weakness    TIA (transient ischemic attack) 10/27/2016   Dependence on renal dialysis ([place]) 08/09/2015   History of CVA with residual deficit 08/07/2015   Seizure, late effect of stroke ([place]) 08/07/2015   Diabetic peripheral neuropathy ([place]) 08/07/2015   Unspecified protein-calorie malnutrition ([place]) 05/27/2015   Seizure ([place]) 04/29/2015   Coagulation defect, unspecified ([place]) 07/16/2014   Type 2 diabetes mellitus with diabetic peripheral angiopathy without gangrene ([place]) 07/01/2014   Pruritus, unspecified 05/26/2014   Elevated prostate specific antigen (PSA) 08/03/2013   Encounter for immunization 07/10/2013   Obstructive sleep apnea 06/16/2013   Hyperlipidemia associated with type 2 diabetes mellitus ([place])    ESRD (end stage renal disease) ([place])    Erectile dysfunction associated with type 2 diabetes mellitus ([place])    Hypertension due to end stage renal disease caused by type 2 diabetes mellitus, on dialysis ([place])    COPD (chronic obstructive pulmonary disease) ([place])    Chronic combined systolic and diastolic CHF (congestive heart failure) ([place])    Anemia of chronic disease 12/27/2011   Long term (current) use of insulin ([place]) 11/03/2009   Gout, unspecified 02/28/2009   Iron deficiency anemia, unspecified 10/30/2008   Secondary hyperparathyroidism of renal origin ([place]) 10/30/2008   Type 2 diabetes mellitus with hyperglycemia, without long-term current use of insulin ([place]) 06/08/1984  Orientation RESPIRATION BLADDER Height & Weight     Self, Place  O2 ([place] 2L) Incontinent Weight: 167 lb 8 oz (76 kg) Height:     BEHAVIORAL SYMPTOMS/MOOD NEUROLOGICAL BOWEL NUTRITION STATUS      Incontinent Diet (DYS 3 thin liquids)  AMBULATORY STATUS  COMMUNICATION OF NEEDS Skin   Extensive Assist Verbally Normal                       Personal Care Assistance Level of Assistance  Bathing, Feeding, Dressing Bathing Assistance: Maximum assistance Feeding assistance: Independent Dressing Assistance: Maximum assistance     Functional Limitations Info  Hearing, Sight, Speech Sight Info: Adequate Hearing Info: Adequate Speech Info: Adequate    SPECIAL CARE FACTORS FREQUENCY  PT (By licensed PT), OT (By licensed OT)     PT Frequency: 5x OT Frequency: 5x            Contractures Contractures Info: Not present    Additional Factors Info  Code Status, Allergies Code Status Info: DNR Allergies Info: no known allergies           Current Medications (09/27/2021):  This is the current hospital active medication list Current Facility-Administered Medications  Medication Dose Route Frequency Provider Last Rate Last Admin    stroke: mapping our early stages of recovery book   Does not apply Once Mansy, Jan A, MD       0.9 %  sodium chloride infusion  100 mL Intravenous PRN Breeze, Shantelle, NP       0.9 %  sodium chloride infusion  100 mL Intravenous PRN Colon Flattery, NP       alteplase (CATHFLO ACTIVASE) injection 2 mg  2 mg Intracatheter Once PRN Colon Flattery, NP       aspirin EC tablet 81 mg  81 mg Oral Daily Mansy, Jan A, MD   81 mg at 09/26/21 1055   atorvastatin (LIPITOR) tablet 40 mg  40 mg Oral Daily Mansy, Jan A, MD   40 mg at 09/26/21 1055   Chlorhexidine Gluconate Cloth 2 % PADS 6 each  6 each Topical Daily Mansy, Jan A, MD   6 each at 09/26/21 1056   feeding supplement (NEPRO CARB STEADY) liquid 237 mL  237 mL Oral BID BM Mansy, Jan A, MD   237 mL at 09/26/21 1516   furosemide (LASIX) injection 60 mg  60 mg Intravenous Q12H Mansy, Jan A, MD   60 mg at 09/27/21 0547   heparin injection 1,000 Units  1,000 Units Dialysis PRN Colon Flattery, NP       heparin injection 5,000 Units  5,000 Units  Subcutaneous BID Nolberto Hanlon, MD   5,000 Units at 09/26/21 2207   levETIRAcetam (KEPPRA) tablet 500 mg  500 mg Oral Once per day on Mon Wed Fri Dolan, Carissa E, RPH       levETIRAcetam (KEPPRA) tablet 500 mg  500 mg Oral BID Nolberto Hanlon, MD   500 mg at 09/26/21 2206   lidocaine (PF) (XYLOCAINE) 1 % injection 5 mL  5 mL Intradermal PRN Colon Flattery, NP       lidocaine-prilocaine (EMLA) cream 1 application  1 application Topical PRN Colon Flattery, NP       mirtazapine (REMERON) tablet 15 mg  15 mg Oral QHS Mansy, Jan A, MD   15 mg at 09/26/21 2206   multivitamin (RENA-VIT) tablet 1 tablet  1 tablet Oral QHS Nolberto Hanlon, MD   1 tablet at 09/27/21  0041   pantoprazole (PROTONIX) EC tablet 40 mg  40 mg Oral Daily Mansy, Jan A, MD   40 mg at 09/26/21 1055   pentafluoroprop-tetrafluoroeth (GEBAUERS) aerosol 1 application  1 application Topical PRN Colon Flattery, NP       polyethylene glycol (MIRALAX / GLYCOLAX) packet 17 g  17 g Oral Daily Mansy, Jan A, MD   17 g at 09/26/21 1055   senna-docusate (Senokot-S) tablet 1 tablet  1 tablet Oral QHS Mansy, Jan A, MD       sertraline (ZOLOFT) tablet 25 mg  25 mg Oral QHS Mansy, Jan A, MD   25 mg at 09/26/21 2206   sevelamer carbonate (RENVELA) tablet 800 mg  800 mg Oral TID WC Mansy, Jan A, MD   800 mg at 09/26/21 1651   thiamine 500mg  in normal saline (56ml) IVPB  500 mg Intravenous TID Kerney Elbe, MD 100 mL/hr at 09/26/21 2303 500 mg at 09/26/21 2303     Discharge Medications: Please see discharge summary for a list of discharge medications.  Relevant Imaging Results:  Relevant Lab Results:   Additional Information SSN: [id number]  Eileen Stanford, LCSW

## 2021-09-28 LAB — POTASSIUM: Potassium: 4.9 mmol/L (ref 3.5–5.1)

## 2021-09-28 MED ORDER — HYDRALAZINE HCL 20 MG/ML IJ SOLN
10.0000 mg | Freq: Four times a day (QID) | INTRAMUSCULAR | Status: DC | PRN
  Administered 2021-09-28: 10 mg via INTRAVENOUS
  Filled 2021-09-28: qty 1

## 2021-09-28 MED ORDER — AMLODIPINE BESYLATE 5 MG PO TABS
5.0000 mg | ORAL_TABLET | Freq: Every day | ORAL | Status: DC
Start: 2021-09-28 — End: 2021-09-29
  Administered 2021-09-28 – 2021-09-29 (×2): 5 mg via ORAL
  Filled 2021-09-28 (×2): qty 1

## 2021-09-28 MED ORDER — RENA-VITE PO TABS: 1.0000 | ORAL_TABLET | Freq: Every day | ORAL | 0 refills | Status: DC

## 2021-09-28 MED ORDER — AMLODIPINE BESYLATE 5 MG PO TABS: 5.0000 mg | ORAL_TABLET | Freq: Every day | ORAL | 3 refills | Status: DC

## 2021-09-28 NOTE — Progress Notes (Signed)
[facility]  ROUNDING NOTE   Subjective:   Patient seen resting quietly Alert and oriented Denies shortness of breath  Objective:  Vital signs in last 24 hours:  Temp:  [97.6 F (36.4 C)-98.8 F (37.1 C)] 98.5 F (36.9 C) (12/15 1154) Pulse Rate:  [59-71] 71 (12/15 1154) Resp:  [13-26] 18 (12/15 1154) BP: (137-181)/(66-108) 149/75 (12/15 1154) SpO2:  [97 %-100 %] 99 % (12/15 1154) Weight:  [78.2 kg-81.5 kg] 78.2 kg (12/15 0600)  Weight change: 5.523 kg Filed Weights   09/27/21 1421 09/27/21 1715 09/28/21 0600  Weight: 81.5 kg 78.9 kg 78.2 kg    Intake/Output: I/O last 3 completed shifts: In: 360 [P.O.:360] Out: 924 [Other:924]   Intake/Output this shift:  Total I/O In: 120 [P.O.:120] Out: -   Physical Exam: General: NAD, resting comfortable   Head: Normocephalic, atraumatic. Moist oral mucosal membranes  Eyes: Anicteric  Lungs:  Clear to auscultation, normal effort, 2L [place]  Heart: Regular rate and rhythm  Abdomen:  Soft, nontender  Extremities:  no peripheral edema.  Neurologic: Nonfocal, moving all four extremities  Skin: No lesions  Access: Rt thigh Permcath    Basic Metabolic Panel: Recent Labs  Lab 09/25/21 1216 09/26/21 0613 09/27/21 0657 09/28/21 0857  NA 134* 136 134*  --   K 4.3 4.9 5.7* 4.9  CL 96* 97* 97*  --   CO2 29 31 26   --   GLUCOSE 136* 75 98  --   BUN 38* 45* 56*  --   CREATININE 4.40* 5.25* 6.62*  --   CALCIUM 8.4* 8.1* 8.0*  --   PHOS  --  3.9  --   --      Liver Function Tests: Recent Labs  Lab 09/25/21 1216  AST 50*  ALT 49*  ALKPHOS 84  BILITOT 0.7  PROT 7.0  ALBUMIN 3.2*    No results for input(s): LIPASE, AMYLASE in the last 168 hours. No results for input(s): AMMONIA in the last 168 hours.  CBC: Recent Labs  Lab 09/25/21 1216 09/26/21 1219  WBC 7.9 6.4  NEUTROABS 5.2  --   HGB 11.5* 10.9*  HCT 34.6* 33.1*  MCV 84.6 84.7  PLT 186 180     Cardiac Enzymes: No results for input(s):  CKTOTAL, CKMB, CKMBINDEX, TROPONINI in the last 168 hours.  BNP: Invalid input(s): POCBNP  CBG: Recent Labs  Lab 09/25/21 1140 09/25/21 2008  GLUCAP 132* 116*     Microbiology: Results for orders placed or performed during the hospital encounter of 09/25/21  Resp Panel by RT-PCR (Flu A&B, Covid) Nasopharyngeal Swab     Status: None   Collection Time: 09/27/21  6:50 PM   Specimen: Nasopharyngeal Swab; Nasopharyngeal(NP) swabs in vial transport medium  Result Value Ref Range Status   SARS Coronavirus 2 by RT PCR NEGATIVE NEGATIVE Final    Comment: (NOTE) SARS-CoV-2 target nucleic acids are NOT DETECTED.  The SARS-CoV-2 RNA is generally detectable in upper respiratory specimens during the acute phase of infection. The lowest concentration of SARS-CoV-2 viral copies this assay can detect is 138 copies/mL. A negative result does not preclude SARS-Cov-2 infection and should not be used as the sole basis for treatment or other patient management decisions. A negative result may occur with  improper specimen collection/handling, submission of specimen other than nasopharyngeal swab, presence of viral mutation(s) within the areas targeted by this assay, and inadequate number of viral copies(<138 copies/mL). A negative result must be combined with clinical observations, patient  history, and epidemiological information. The expected result is Negative.  Fact Sheet for Patients:  EntrepreneurPulse.com.au  Fact Sheet for Healthcare Providers:  IncredibleEmployment.be  This test is no t yet approved or cleared by the [place] FDA and  has been authorized for detection and/or diagnosis of SARS-CoV-2 by FDA under an Emergency Use Authorization (EUA). This EUA will remain  in effect (meaning this test can be used) for the duration of the COVID-19 declaration under Section 564(b)(1) of the Act, 21 U.S.C.section 360bbb-3(b)(1), unless the  authorization is terminated  or revoked sooner.       Influenza A by PCR NEGATIVE NEGATIVE Final   Influenza B by PCR NEGATIVE NEGATIVE Final    Comment: (NOTE) The Xpert Xpress SARS-CoV-2/FLU/RSV plus assay is intended as an aid in the diagnosis of influenza from Nasopharyngeal swab specimens and should not be used as a sole basis for treatment. Nasal washings and aspirates are unacceptable for Xpert Xpress SARS-CoV-2/FLU/RSV testing.  Fact Sheet for Patients: EntrepreneurPulse.com.au  Fact Sheet for Healthcare Providers: IncredibleEmployment.be  This test is not yet approved or cleared by the [place] FDA and has been authorized for detection and/or diagnosis of SARS-CoV-2 by FDA under an Emergency Use Authorization (EUA). This EUA will remain in effect (meaning this test can be used) for the duration of the COVID-19 declaration under Section 564(b)(1) of the Act, 21 U.S.C. section 360bbb-3(b)(1), unless the authorization is terminated or revoked.  Performed at [facility], [address] [place], [place] [address]     Coagulation Studies: No results for input(s): LABPROT, INR in the last 72 hours.   Urinalysis: No results for input(s): COLORURINE, LABSPEC, PHURINE, GLUCOSEU, HGBUR, BILIRUBINUR, KETONESUR, PROTEINUR, UROBILINOGEN, NITRITE, LEUKOCYTESUR in the last 72 hours.  Invalid input(s): APPERANCEUR    Imaging: EEG adult  Result Date: 09/26/2021 Lora Havens, MD     09/26/2021  2:34 PM Patient Name: Ernest [name] MRN: [id number] Epilepsy Attending: Lora Havens Referring Physician/Provider: Dr Kerney Elbe Date: 09/26/2021 Duration: 23.22 mins Patient history: 75yo M with ams and speech disturbance. EEG to evaluate for seizure Level of alertness: Awake AEDs during EEG study: LEV Technical aspects: This EEG study was done with scalp electrodes positioned according to the 10-20 International system of  electrode placement. Electrical activity was acquired at a sampling rate of 500Hz  and reviewed with a high frequency filter of 70Hz  and a low frequency filter of 1Hz . EEG data were recorded continuously and digitally stored. Description: The posterior dominant rhythm consists of 8 Hz activity of moderate voltage (25-35 uV) seen predominantly in posterior head regions, symmetric and reactive to eye opening and eye closing. Physiologic photic driving was not seen during photic stimulation.  Hyperventilation was not performed.   IMPRESSION: This study is within normal limits. No seizures or epileptiform discharges were seen throughout the recording. Priyanka Barbra Sarks     Medications:    thiamine injection 500 mg (09/28/21 0937)     stroke: mapping our early stages of recovery book   Does not apply Once   amLODipine  5 mg Oral Daily   aspirin EC  81 mg Oral Daily   atorvastatin  40 mg Oral Daily   Chlorhexidine Gluconate Cloth  6 each Topical Daily   feeding supplement (NEPRO CARB STEADY)  237 mL Oral BID BM   heparin  5,000 Units Subcutaneous BID   levETIRAcetam  500 mg Oral Once per day on Mon Wed Fri   levETIRAcetam  500 mg  Oral BID   mirtazapine  15 mg Oral QHS   multivitamin  1 tablet Oral QHS   pantoprazole  40 mg Oral Daily   polyethylene glycol  17 g Oral Daily   senna-docusate  1 tablet Oral QHS   sertraline  25 mg Oral QHS   sevelamer carbonate  800 mg Oral TID WC     Assessment/ Plan:  Mr. FAARIS ARIZPE is a 75 y.o.  male  with medical problems of  ESRD, COPD, chronic systolic CHF, coronary artery disease status post CABG and mitral valve repair in 2018, depression, gout, hepatitis C, history of seizures, hyperlipidemia, stroke, history of diabetes, severe diffuse calcified atherosclerosis, chronic small vessel ischemic changes and history of indeterminate lacunar infarct in the brain  was admitted on 09/25/2021 for TIA (transient ischemic attack) [G45.9] Slurred speech  [R47.81] Chronic respiratory failure with hypoxia (HCC) [J96.11] Acute encephalopathy [G93.40]  [facility] [address]/MWF/Rt groin Permcath   End stage renal disease on dialysis. Will maintain outpatient scheduled, if possible. Next treatment scheduled for Friday.    2. Anemia of chronic kidney disease Lab Results  Component Value Date   HGB 10.9 (L) 09/26/2021    Hbg at target  3. Secondary Hyperparathyroidism:  Lab Results  Component Value Date   PTH 63 10/05/2017   PTH Comment 10/05/2017   CALCIUM 8.0 (L) 09/27/2021   CAION 1.05 (L) 07/12/2020   PHOS 3.9 09/26/2021    Calcium and phosphorus within acceptable range  4. Hypertension with chronic kidney disease.  Currently receiving Amlodipine.  Blood pressure 149/75    LOS: 3   12/15/20222:20 PM

## 2021-09-28 NOTE — Discharge Instructions (Signed)
Resume outpatient hemodialysis as before

## 2021-09-28 NOTE — TOC Progression Note (Signed)
Transition of Care [facility]) - Progression Note    Patient Details  Name: Ernest [name] MRN: [id number] Date of Birth: 1946/08/19  [facility] CM/SW Contact  Eileen Stanford, LCSW Phone Number: 09/28/2021, 3:33 PM  Clinical Narrative:   SNF had started auth this morning and expedited it, however no decision at this time. MD notified.     Expected Discharge Plan: [place] Barriers to Discharge: No Barriers Identified  Expected Discharge Plan and Services Expected Discharge Plan: [place] In-house Referral: Clinical Social Work   Post Acute Care Choice: [place] Living arrangements for the past 2 months: [place] Expected Discharge Date: 09/28/21                                     Social Determinants of Health (SDOH) Interventions    Readmission Risk Interventions Readmission Risk Prevention Plan 06/28/2021  Transportation Screening Complete  Medication Review Press photographer) Complete  PCP or Specialist appointment within 3-5 days of discharge Complete  HRI or Home Care Consult Complete  SW Recovery Care/Counseling Consult Complete  Palliative Care Screening Not [place] Complete  Some recent data might be hidden

## 2021-09-28 NOTE — Discharge Summary (Addendum)
[facility] NAME: Ernest [name]    MR#:  [id number]  DATE OF BIRTH:  05-Nov-1945  DATE OF ADMISSION:  09/25/2021 ADMITTING PHYSICIAN: Christel Mormon, MD  DATE OF DISCHARGE: 09/28/2021  PRIMARY CARE PHYSICIAN: Patient, [name] (Inactive)    ADMISSION DIAGNOSIS:  TIA (transient ischemic attack) [G45.9] Slurred speech [R47.81] Chronic respiratory failure with hypoxia (HCC) [J96.11] Acute encephalopathy [G93.40]  DISCHARGE DIAGNOSIS:  TIA HTN SECONDARY DIAGNOSIS:   Past Medical History:  Diagnosis Date   Abnormal stress test    s/p cath November 2013 with modest disease involving the ostial left main, proximal LAD, proximal RCA - do not appear to be hemodynamically signficant; mild LV dysfunction   Anemia    low iron   Anxiety    Arthritis    Bacteremia    Chronic systolic CHF (congestive heart failure) (HCC)    COPD (chronic obstructive pulmonary disease) ([place])    Coronary artery disease    a. per cath report 2013, b. 09/13/2017-CABG X4 & Mitral valve repair   Depression    DVT (deep venous thrombosis) (HCC)    arm - right- finished blood thinner- not sure when it   Ejection fraction < 50%    Erectile dysfunction    penile implant   ESRD (end stage renal disease) ([place])    [facility] on a MWF schedule.      Gout    Hx: of   Hepatitis C    History of seizures    last one 2018   Hyperlipidemia    Hypertension    Mitral valve disease    s/p Mitral repair 09/13/17   Obesity    Retinopathy    Shortness of breath    Hx: of with exertion   Stroke ([place])    "several" left side weakness, speech affected   Type II or unspecified type diabetes mellitus without mention of complication, not stated as uncontrolled    adult onset    HOSPITAL COURSE:   Ernest [name] is a 75 y.o. male with medical history significant for COPD, systolic CHF, coronary artery disease, depression,, s/p right AKA, CVA  with residual dysarthria end-stage renal disease on hemodialysis who was noted to have worsening slurred speech while having his dialysis session today with worsening confusion.  He was then brought to the ER and code stroke was activated.   Slurred speech resolved. Stroke ruled out.SUspected TIA MRI without acute finding EEG negative mental status at baseline carotid ultrasound no significant stenosis echo done recently 9/22 year 33 to 55% continue aspirin patient seen by neurology Per Dr Cheral Marker "These findings include severe chronic small vessel ischemic disease, lacunar infarcts in the bilateral corona radiata, thalami, and cerebellar hemispheres, and multiple punctate foci of susceptibility, which may represent sequela of hypertensive microhemorrhages" -cont asa 81 mg qd --resumed statins   ESRB on hemodialysis Hyperkalemia -- scheduled for dialysis now. -- Patient had discussed with Dr Kurtis Bushman that he wanted to discontinue dialysis. -- Palliative care consultation placed. Discussed at length with patient and daughter [name] at bedside. Resume hemodialysis for now. Palliative care will follow outpatient for goals of care.  K 4.9 today   Paroxysmal a fib -- HR 60's in SR --not on any rate blocking agent chronically   seizure disorder -- continue Keppra -- EEG negative   Depression -- on Zoloft   HTN --start norvasc 5 mg po qd--out pt PCP or nephrology to adjust  BP meds   Procedures: Family communication : daughter [name] left VM today Consults : neurology, nephrology CODE STATUS: DNR DVT Prophylaxis : heparin Level of care: Med-Surg Status is: Inpatient   Rpatient will return back to [facility] today       CONSULTS OBTAINED:    DRUG ALLERGIES:  [name]  DISCHARGE MEDICATIONS:   Allergies as of 09/28/2021   [name]      Medication List     STOP taking these medications    allopurinol 100 MG tablet Commonly known as:  ZYLOPRIM   amiodarone 200 MG tablet Commonly known as: PACERONE   docusate sodium 100 MG capsule Commonly known as: COLACE   midodrine 10 MG tablet Commonly known as: PROAMATINE   mirtazapine 15 MG tablet Commonly known as: REMERON       TAKE these medications    acetaminophen 500 MG tablet Commonly known as: TYLENOL Take 1,000 mg by mouth every 6 (six) hours as needed for mild pain.   amLODipine 5 MG tablet Commonly known as: NORVASC Take 1 tablet (5 mg total) by mouth daily.   aspirin EC 81 MG tablet Take 1 tablet (81 mg total) by mouth daily.   atorvastatin 40 MG tablet Commonly known as: LIPITOR Take 40 mg by mouth at bedtime.   feeding supplement (NEPRO CARB STEADY) Liqd Take 237 mLs by mouth 2 (two) times daily between meals.   feeding supplement (PRO-STAT 64) Liqd Take 30 mLs by mouth 3 (three) times daily with meals.   levETIRAcetam 500 MG tablet Commonly known as: KEPPRA Take 500 mg by mouth 2 (two) times daily. Only on Tuesday, Thursday, Saturday, Sunday   levETIRAcetam 500 MG tablet Commonly known as: KEPPRA Take 500 mg by mouth 3 (three) times daily. On MWF - administer lunchtime dose once returns from dialysis   multivitamin Tabs tablet Take 1 tablet by mouth at bedtime.   pantoprazole 40 MG tablet Commonly known as: PROTONIX Take 1 tablet (40 mg total) by mouth daily.   polyethylene glycol 17 g packet Commonly known as: MIRALAX / GLYCOLAX Take 17 g by mouth daily. Dissolve in 4-8 ounces of water   senna-docusate 8.6-50 MG tablet Commonly known as: Senokot-S Take 1 tablet by mouth at bedtime.   sertraline 25 MG tablet Commonly known as: ZOLOFT Take 25 mg by mouth at bedtime.   sevelamer carbonate 800 MG tablet Commonly known as: RENVELA Take 800 mg by mouth 3 (three) times daily with meals. On Monday, Wednesday, and Friday. Give NOON dose on MWF asap when returning from dialysis   sevelamer carbonate 800 MG tablet Commonly known as:  RENVELA Take 800 mg by mouth 3 (three) times daily with meals. On Tuesday, Thursday, Saturday, and Sunday (non-dialysis days)               Discharge Care Instructions  (From admission, onward)           Start     Ordered   09/28/21 0000  Discharge wound care:       Comments: Pressure Injury 09/27/21 Toe (Comment  which one) Left;Anterior;Upper Unstageable - Full thickness tissue loss in which the base of the injury is covered by slough (yellow, tan, gray, green or brown) and/or eschar (tan, brown or black) in the wound bed.  Apply foam padding   09/28/21 0953            If you experience worsening of your admission symptoms, develop shortness of  breath, life threatening emergency, suicidal or homicidal thoughts you must seek medical attention immediately by calling 911 or calling your MD immediately  if symptoms less severe.  You Must read complete instructions/literature along with all the possible adverse reactions/side effects for all the Medicines you take and that have been prescribed to you. Take any new Medicines after you have completely understood and accept all the possible adverse reactions/side effects.   Please note  You were cared for by a hospitalist during your hospital stay. If you have any questions about your discharge medications or the care you received while you were in the hospital after you are discharged, you can call the unit and asked to speak with the hospitalist on call if the hospitalist that took care of you is not available. Once you are discharged, your primary care physician will handle any further medical issues. Please note that NO REFILLS for any discharge medications will be authorized once you are discharged, as it is imperative that you return to your primary care physician (or establish a relationship with a primary care physician if you do not have one) for your aftercare needs so that they can reassess your need for medications and  monitor your lab values. Today   SUBJECTIVE   More alert. No new complaints  VITAL SIGNS:  Blood pressure 139/71, pulse 66, temperature 98.5 F (36.9 C), temperature source Oral, resp. rate 18, weight 78.2 kg, SpO2 97 %.  I/O:   Intake/Output Summary (Last 24 hours) at 09/28/2021 1003 Last data filed at 09/27/2021 1715 Gross per 24 hour  Intake 360 ml  Output 924 ml  Net -564 ml    PHYSICAL EXAMINATION:    GENERAL:  75 y.o.-year-old patient lying in the bed with no acute distress. Obese chronically ill HEENT: Head atraumatic, normocephalic. Oropharynx and nasopharynx clear.  LUNGS: Normal breath sounds bilaterally, no wheezing, rales, rhonchi. No use of accessory muscles of respiration.  CARDIOVASCULAR: S1, S2 normal. No murmurs, rubs, or gallops.  ABDOMEN: Soft, nontender, nondistended. Bowel sounds present. EXTREMITIES: No cyanosis, clubbing or edema b/l.    NEUROLOGIC: nonfocal PSYCHIATRIC:  patient is alert  SKIN:  Pressure Injury 07/16/21 Sacrum Medial;Lower Stage 2 -  Partial thickness loss of dermis presenting as a shallow open injury with a red, pink wound bed without slough. (Active)  07/16/21 0430  Location: Sacrum  Location Orientation: Medial;Lower  Staging: Stage 2 -  Partial thickness loss of dermis presenting as a shallow open injury with a red, pink wound bed without slough.  Wound Description (Comments):   Present on Admission: Yes     Pressure Injury 09/27/21 Toe (Comment  which one) Left;Anterior;Upper Unstageable - Full thickness tissue loss in which the base of the injury is covered by slough (yellow, tan, gray, green or brown) and/or eschar (tan, brown or black) in the wound bed.  (Active)  09/27/21 0915  Location: Toe (Comment  which one)  Location Orientation: Left;Anterior;Upper  Staging: Unstageable - Full thickness tissue loss in which the base of the injury is covered by slough (yellow, tan, gray, green or brown) and/or eschar (tan, brown or  black) in the wound bed.  Wound Description (Comments): Black unstageable wound to right great toe  Present on Admission: Yes    DATA REVIEW:   CBC  Recent Labs  Lab 09/26/21 1219  WBC 6.4  HGB 10.9*  HCT 33.1*  PLT 180    Chemistries  Recent Labs  Lab 09/25/21 1216 09/26/21 0613 09/27/21 3154  09/28/21 0857  NA 134*   < > 134*  --   K 4.3   < > 5.7* 4.9  CL 96*   < > 97*  --   CO2 29   < > 26  --   GLUCOSE 136*   < > 98  --   BUN 38*   < > 56*  --   CREATININE 4.40*   < > 6.62*  --   CALCIUM 8.4*   < > 8.0*  --   AST 50*  --   --   --   ALT 49*  --   --   --   ALKPHOS 84  --   --   --   BILITOT 0.7  --   --   --    < > = values in this interval not displayed.    Microbiology Results   Recent Results (from the past 240 hour(s))  Resp Panel by RT-PCR (Flu A&B, Covid) Nasopharyngeal Swab     Status: None   Collection Time: 09/27/21  6:50 PM   Specimen: Nasopharyngeal Swab; Nasopharyngeal(NP) swabs in vial transport medium  Result Value Ref Range Status   SARS Coronavirus 2 by RT PCR NEGATIVE NEGATIVE Final    Comment: (NOTE) SARS-CoV-2 target nucleic acids are NOT DETECTED.  The SARS-CoV-2 RNA is generally detectable in upper respiratory specimens during the acute phase of infection. The lowest concentration of SARS-CoV-2 viral copies this assay can detect is 138 copies/mL. A negative result does not preclude SARS-Cov-2 infection and should not be used as the sole basis for treatment or other patient management decisions. A negative result may occur with  improper specimen collection/handling, submission of specimen other than nasopharyngeal swab, presence of viral mutation(s) within the areas targeted by this assay, and inadequate number of viral copies(<138 copies/mL). A negative result must be combined with clinical observations, patient history, and epidemiological information. The expected result is Negative.  Fact Sheet for Patients:   EntrepreneurPulse.com.au  Fact Sheet for Healthcare Providers:  IncredibleEmployment.be  This test is no t yet approved or cleared by the [place] FDA and  has been authorized for detection and/or diagnosis of SARS-CoV-2 by FDA under an Emergency Use Authorization (EUA). This EUA will remain  in effect (meaning this test can be used) for the duration of the COVID-19 declaration under Section 564(b)(1) of the Act, 21 U.S.C.section 360bbb-3(b)(1), unless the authorization is terminated  or revoked sooner.       Influenza A by PCR NEGATIVE NEGATIVE Final   Influenza B by PCR NEGATIVE NEGATIVE Final    Comment: (NOTE) The Xpert Xpress SARS-CoV-2/FLU/RSV plus assay is intended as an aid in the diagnosis of influenza from Nasopharyngeal swab specimens and should not be used as a sole basis for treatment. Nasal washings and aspirates are unacceptable for Xpert Xpress SARS-CoV-2/FLU/RSV testing.  Fact Sheet for Patients: EntrepreneurPulse.com.au  Fact Sheet for Healthcare Providers: IncredibleEmployment.be  This test is not yet approved or cleared by the [place] FDA and has been authorized for detection and/or diagnosis of SARS-CoV-2 by FDA under an Emergency Use Authorization (EUA). This EUA will remain in effect (meaning this test can be used) for the duration of the COVID-19 declaration under Section 564(b)(1) of the Act, 21 U.S.C. section 360bbb-3(b)(1), unless the authorization is terminated or revoked.  Performed at [facility], [address] [place], [place] [address]     RADIOLOGY:  EEG adult  Result Date: 09/26/2021 Lora Havens,  MD     09/26/2021  2:34 PM Patient Name: RAMONA SLINGER MRN: [id number] Epilepsy Attending: Lora Havens Referring Physician/Provider: Dr Kerney Elbe Date: 09/26/2021 Duration: 23.22 mins Patient history: 75yo M with ams and speech  disturbance. EEG to evaluate for seizure Level of alertness: Awake AEDs during EEG study: LEV Technical aspects: This EEG study was done with scalp electrodes positioned according to the 10-20 International system of electrode placement. Electrical activity was acquired at a sampling rate of 500Hz  and reviewed with a high frequency filter of 70Hz  and a low frequency filter of 1Hz . EEG data were recorded continuously and digitally stored. Description: The posterior dominant rhythm consists of 8 Hz activity of moderate voltage (25-35 uV) seen predominantly in posterior head regions, symmetric and reactive to eye opening and eye closing. Physiologic photic driving was not seen during photic stimulation.  Hyperventilation was not performed.   IMPRESSION: This study is within normal limits. No seizures or epileptiform discharges were seen throughout the recording. Priyanka Barbra Sarks     CODE STATUS:     Code Status Orders  (From admission, onward)           Start     Ordered   09/26/21 1348  Do not attempt resuscitation (DNR)  Continuous       Question Answer Comment  In the event of cardiac or respiratory ARREST Do not call a code blue   In the event of cardiac or respiratory ARREST Do not perform Intubation, CPR, defibrillation or ACLS   In the event of cardiac or respiratory ARREST Use medication by any route, position, wound care, and other measures to relive pain and suffering. May use oxygen, suction and manual treatment of airway obstruction as needed for comfort.      09/26/21 1347           Code Status History     Date Active Date Inactive Code Status Order ID Comments User Context   09/26/2021 1342 09/26/2021 1347 Full Code [id number]  Nolberto Hanlon, MD Inpatient   09/25/2021 1606 09/26/2021 1342 DNR [id number]  Christel Mormon, MD ED   07/13/2021 1456 07/21/2021 0009 DNR [id number]  Asencion Gowda, NP Inpatient   07/08/2021 1625 07/13/2021 1455 Full Code [id number]  Elmore Guise, MD  Inpatient   06/27/2021 0250 07/08/2021 1456 Full Code [id number]  Mansy, Arvella Merles, MD ED   09/30/2019 1657 09/30/2019 2058 Full Code [id number]  Ina Homes, MD ED   09/08/2017 0359 10/07/2017 2150 Full Code [id number]  Collier Salina, MD ED   10/27/2016 2044 10/29/2016 2121 Full Code [id number]  Zada Finders, MD Inpatient   07/13/2015 1652 07/21/2015 1336 Full Code [id number]  Cathlyn Parsons, [name] Inpatient   07/11/2015 1629 07/13/2015 1652 Full Code [id number]  Juluis Mire, MD ED   04/29/2015 1352 04/30/2015 2038 Full Code [id number]  Lucious Groves, DO Inpatient   08/27/2013 0427 08/27/2013 1845 Full Code [id number]  Mal Misty, MD Inpatient        TOTAL TIME TAKING CARE OF THIS PATIENT: 35 minutes.    Fritzi Mandes M.D  [facility]    CC: Primary care physician; Patient, [name] (Inactive)

## 2021-09-28 NOTE — TOC Progression Note (Signed)
Transition of Care [facility]) - Progression Note    Patient Details  Name: Ernest [name] MRN: [id number] Date of Birth: 1946/06/11  [facility] CM/SW Contact  Eileen [place], LCSW Phone Number: 09/28/2021, 10:30 AM  Clinical Narrative:   [facility] has started [place].    Expected Discharge Plan: [place] Barriers to Discharge: No Barriers Identified  Expected Discharge Plan and Services Expected Discharge Plan: [place] In-house Referral: Clinical Social Work   Post Acute Care Choice: [place] Living arrangements for the past 2 months: [place] Expected Discharge Date: 09/28/21                                     Social Determinants of Health (SDOH) Interventions    Readmission Risk Interventions Readmission Risk Prevention Plan 06/28/2021  Transportation Screening Complete  Medication Review Press photographer) Complete  PCP or Specialist appointment within 3-5 days of discharge Complete  HRI or Home Care Consult Complete  SW Recovery Care/Counseling Consult Complete  Palliative Care Screening Not [place] Complete  Some recent data might be hidden

## 2021-09-29 MED ORDER — AMLODIPINE BESYLATE 5 MG PO TABS
5.0000 mg | ORAL_TABLET | Freq: Every evening | ORAL | Status: DC
  Filled 2021-09-29: qty 1

## 2021-09-29 MED ORDER — ALTEPLASE 2 MG IJ SOLR
INTRAMUSCULAR | Status: AC
  Administered 2021-09-29: 1 mg
  Filled 2021-09-29: qty 2

## 2021-09-29 MED ORDER — HEPARIN SODIUM (PORCINE) 1000 UNIT/ML IJ SOLN
INTRAMUSCULAR | Status: AC
  Filled 2021-09-29: qty 10

## 2021-09-29 NOTE — TOC Transition Note (Signed)
Transition of Care [facility]) - CM/SW Discharge Note   Patient Details  Name: Ernest [name] MRN: [id number] Date of Birth: [birth year]  [facility] of [facility]) CM/SW Contact:  Alberteen Sam, LCSW Phone Number: 09/29/2021, 2:12 PM   Clinical Narrative:     Patient will DC to:[place] Anticipated DC date:09/29/21 Family notified: daughter Academic librarian byJohnanna Schneiders  Per MD patient ready for [facility] to [facility] . RN, patient, patient's family, and facility notified of DC. Discharge Summary sent to facility. RN given number for report   [phone] . DC packet on chart. Ambulance transport requested for patient.  CSW signing off.  Pricilla Riffle, LCSW   Final next level of care: Skilled Nursing Facility Barriers to Discharge: No Barriers Identified   Patient Goals and CMS Choice Patient states their goals for this hospitalization and ongoing recovery are:: to go home CMS Medicare.gov Compare Post Acute Care list provided to:: Patient Represenative (must comment) (daughter [name]) Choice offered to / list presented to : Adult Children  Discharge Placement              Patient [facility] bed at: [facility] Patient to be transferred to facility by: [facility] Name of family member notified: daughter [name] Patient and family notified of of transfer: 09/29/21  Discharge Plan and Services In-house Referral: Clinical Social Work   Post Acute Care Choice: [place]                               Social Determinants of Health (SDOH) Interventions     Readmission Risk Interventions Readmission Risk Prevention Plan 06/28/2021  Transportation Screening Complete  Medication Review Press photographer) Complete  PCP or Specialist appointment within 3-5 days of discharge Complete  HRI or Home Care Consult Complete  SW Recovery Care/Counseling Consult Complete  Palliative Care Screening Not [place] Complete  Some recent data  might be hidden

## 2021-09-29 NOTE — Care Management Important Message (Signed)
Important Message  Patient Details  Name: BURRIS MATHERNE MRN: [id number] Date of Birth: 01-18-1946   Medicare Important Message Given:  Yes  Patient out of room upon time of visit.  Confirmed copy of Medicare IM in room for reference.   Dannette Barbara 09/29/2021, 1:51 PM

## 2021-09-29 NOTE — Progress Notes (Signed)
Patient tolerates HD session without incident, CVC in right thigh functions better this session after Cath Flo. UF target met with 1.5 liter fluid removal, BFR reduced to 350 due to elevated arterial pressure. Report given to primary nurse.

## 2021-09-29 NOTE — Discharge Summary (Signed)
[facility] [place] NAME: Ernest [name]    MR#:  [id number]  DATE OF BIRTH:  08-01-1946  DATE OF ADMISSION:  09/25/2021 ADMITTING PHYSICIAN: Christel Mormon, MD  DATE OF DISCHARGE: 09/29/2021  PRIMARY CARE PHYSICIAN: Patient, [name] (Inactive)    ADMISSION DIAGNOSIS:  TIA (transient ischemic attack) [G45.9] Slurred speech [R47.81] Chronic respiratory failure with hypoxia (HCC) [J96.11] Acute encephalopathy [G93.40]  DISCHARGE DIAGNOSIS:  TIA HTN ESRD on HD SECONDARY DIAGNOSIS:   Past Medical History:  Diagnosis Date   Abnormal stress test    s/p cath November 2013 with modest disease involving the ostial left main, proximal LAD, proximal RCA - do not appear to be hemodynamically signficant; mild LV dysfunction   Anemia    low iron   Anxiety    Arthritis    Bacteremia    Chronic systolic CHF (congestive heart failure) (HCC)    COPD (chronic obstructive pulmonary disease) ([place])    Coronary artery disease    a. per cath report 2013, b. 09/13/2017-CABG X4 & Mitral valve repair   Depression    DVT (deep venous thrombosis) (HCC)    arm - right- finished blood thinner- not sure when it   Ejection fraction < 50%    Erectile dysfunction    penile implant   ESRD (end stage renal disease) ([place])    [facility] on a MWF schedule.      Gout    Hx: of   Hepatitis C    History of seizures    last one 2018   Hyperlipidemia    Hypertension    Mitral valve disease    s/p Mitral repair 09/13/17   Obesity    Retinopathy    Shortness of breath    Hx: of with exertion   Stroke ([place])    "several" left side weakness, speech affected   Type II or unspecified type diabetes mellitus without mention of complication, not stated as uncontrolled    adult onset    HOSPITAL COURSE:   Ernest [name] is a 75 y.o. male with medical history significant for COPD, systolic CHF, coronary artery disease, depression,, s/p right  AKA, CVA with residual dysarthria end-stage renal disease on hemodialysis who was noted to have worsening slurred speech while having his dialysis session today with worsening confusion.  He was then brought to the ER and code stroke was activated.   Slurred speech resolved. Stroke ruled out.SUspected TIA MRI without acute finding EEG negative mental status at baseline carotid ultrasound no significant stenosis echo done recently 9/22 year 87 to 55% continue aspirin patient seen by neurology Per Dr Cheral Marker "These findings include severe chronic small vessel ischemic disease, lacunar infarcts in the bilateral corona radiata, thalami, and cerebellar hemispheres, and multiple punctate foci of susceptibility, which may represent sequela of hypertensive microhemorrhages" -cont asa 81 mg qd --resumed statins   ESRB on hemodialysis Hyperkalemia -- scheduled for dialysis now. -- Patient had discussed with Dr Kurtis Bushman that he wanted to discontinue dialysis. -- Palliative care consultation placed. Discussed at length with patient and daughter Ernest [name] at bedside. Resume hemodialysis for now. Palliative care will follow outpatient for goals of care.  K 4.9 after HD   Paroxysmal a fib -- HR 60's in SR --not on any rate blocking agent chronically   seizure disorder -- continue Keppra -- EEG negative   Depression -- on Zoloft   HTN --start norvasc 5 mg po qd--out pt PCP  or nephrology to adjust BP meds  Overall at baseline. Discharge to [facility] today after HD   Procedures: Family communication : daughter Ernest [name] left VM  Consults : neurology, nephrology CODE STATUS: DNR DVT Prophylaxis : heparin Level of care: Med-Surg Status is: Inpatient   patient will return back to [facility] today       CONSULTS OBTAINED:    DRUG ALLERGIES:  [name]  DISCHARGE MEDICATIONS:   Allergies as of 09/29/2021   [name]      Medication List     STOP taking these  medications    allopurinol 100 MG tablet Commonly known as: ZYLOPRIM   amiodarone 200 MG tablet Commonly known as: PACERONE   docusate sodium 100 MG capsule Commonly known as: COLACE   midodrine 10 MG tablet Commonly known as: PROAMATINE   mirtazapine 15 MG tablet Commonly known as: REMERON       TAKE these medications    acetaminophen 500 MG tablet Commonly known as: TYLENOL Take 1,000 mg by mouth every 6 (six) hours as needed for mild pain.   amLODipine 5 MG tablet Commonly known as: NORVASC Take 1 tablet (5 mg total) by mouth daily.   aspirin EC 81 MG tablet Take 1 tablet (81 mg total) by mouth daily.   atorvastatin 40 MG tablet Commonly known as: LIPITOR Take 40 mg by mouth at bedtime.   feeding supplement (NEPRO CARB STEADY) Liqd Take 237 mLs by mouth 2 (two) times daily between meals.   feeding supplement (PRO-STAT 64) Liqd Take 30 mLs by mouth 3 (three) times daily with meals.   levETIRAcetam 500 MG tablet Commonly known as: KEPPRA Take 500 mg by mouth 2 (two) times daily. Only on Tuesday, Thursday, Saturday, Sunday   levETIRAcetam 500 MG tablet Commonly known as: KEPPRA Take 500 mg by mouth 3 (three) times daily. On MWF - administer lunchtime dose once returns from dialysis   multivitamin Tabs tablet Take 1 tablet by mouth at bedtime.   pantoprazole 40 MG tablet Commonly known as: PROTONIX Take 1 tablet (40 mg total) by mouth daily.   polyethylene glycol 17 g packet Commonly known as: MIRALAX / GLYCOLAX Take 17 g by mouth daily. Dissolve in 4-8 ounces of water   senna-docusate 8.6-50 MG tablet Commonly known as: Senokot-S Take 1 tablet by mouth at bedtime.   sertraline 25 MG tablet Commonly known as: ZOLOFT Take 25 mg by mouth at bedtime.   sevelamer carbonate 800 MG tablet Commonly known as: RENVELA Take 800 mg by mouth 3 (three) times daily with meals. On Monday, Wednesday, and Friday. Give NOON dose on MWF asap when returning from  dialysis   sevelamer carbonate 800 MG tablet Commonly known as: RENVELA Take 800 mg by mouth 3 (three) times daily with meals. On Tuesday, Thursday, Saturday, and Sunday (non-dialysis days)               Discharge Care Instructions  (From admission, onward)           Start     Ordered   09/28/21 0000  Discharge wound care:       Comments: Pressure Injury 09/27/21 Toe (Comment  which one) Left;Anterior;Upper Unstageable - Full thickness tissue loss in which the base of the injury is covered by slough (yellow, tan, gray, green or brown) and/or eschar (tan, brown or black) in the wound bed.  Apply foam padding   09/28/21 0953  If you experience worsening of your admission symptoms, develop shortness of breath, life threatening emergency, suicidal or homicidal thoughts you must seek medical attention immediately by calling 911 or calling your MD immediately  if symptoms less severe.  You Must read complete instructions/literature along with all the possible adverse reactions/side effects for all the Medicines you take and that have been prescribed to you. Take any new Medicines after you have completely understood and accept all the possible adverse reactions/side effects.   Please note  You were cared for by a hospitalist during your hospital stay. If you have any questions about your discharge medications or the care you received while you were in the hospital after you are discharged, you can call the unit and asked to speak with the hospitalist on call if the hospitalist that took care of you is not available. Once you are discharged, your primary care physician will handle any further medical issues. Please note that NO REFILLS for any discharge medications will be authorized once you are discharged, as it is imperative that you return to your primary care physician (or establish a relationship with a primary care physician if you do not have one) for your aftercare  needs so that they can reassess your need for medications and monitor your lab values. Today   SUBJECTIVE   More alert. No new complaints  VITAL SIGNS:  Blood pressure (!) 174/69, pulse 70, temperature 97.6 F (36.4 C), temperature source Oral, resp. rate 18, weight 78.5 kg, SpO2 99 %.  I/O:   Intake/Output Summary (Last 24 hours) at 09/29/2021 0924 Last data filed at 09/29/2021 0700 Gross per 24 hour  Intake 360 ml  Output --  Net 360 ml     PHYSICAL EXAMINATION:    GENERAL:  75 y.o.-year-old patient lying in the bed with no acute distress. Obese chronically ill HEENT: Head atraumatic, normocephalic. Oropharynx and nasopharynx clear.  LUNGS: Normal breath sounds bilaterally, no wheezing, rales, rhonchi. No use of accessory muscles of respiration.  CARDIOVASCULAR: S1, S2 normal. No murmurs, rubs, or gallops.  ABDOMEN: Soft, nontender, nondistended. Bowel sounds present. EXTREMITIES: No cyanosis, clubbing or edema b/l.    NEUROLOGIC: nonfocal PSYCHIATRIC:  patient is alert  SKIN:  Pressure Injury 07/16/21 Sacrum Medial;Lower Stage 2 -  Partial thickness loss of dermis presenting as a shallow open injury with a red, pink wound bed without slough. (Active)  07/16/21 0430  Location: Sacrum  Location Orientation: Medial;Lower  Staging: Stage 2 -  Partial thickness loss of dermis presenting as a shallow open injury with a red, pink wound bed without slough.  Wound Description (Comments):   Present on Admission: Yes     Pressure Injury 09/27/21 Toe (Comment  which one) Left;Anterior;Upper Unstageable - Full thickness tissue loss in which the base of the injury is covered by slough (yellow, tan, gray, green or brown) and/or eschar (tan, brown or black) in the wound bed.  (Active)  09/27/21 0915  Location: Toe (Comment  which one)  Location Orientation: Left;Anterior;Upper  Staging: Unstageable - Full thickness tissue loss in which the base of the injury is covered by slough  (yellow, tan, gray, green or brown) and/or eschar (tan, brown or black) in the wound bed.  Wound Description (Comments): Black unstageable wound to right great toe  Present on Admission: Yes    DATA REVIEW:   CBC  Recent Labs  Lab 09/26/21 1219  WBC 6.4  HGB 10.9*  HCT 33.1*  PLT 180  Chemistries  Recent Labs  Lab 09/25/21 1216 09/26/21 0613 09/27/21 0657 09/28/21 0857  NA 134*   < > 134*  --   K 4.3   < > 5.7* 4.9  CL 96*   < > 97*  --   CO2 29   < > 26  --   GLUCOSE 136*   < > 98  --   BUN 38*   < > 56*  --   CREATININE 4.40*   < > 6.62*  --   CALCIUM 8.4*   < > 8.0*  --   AST 50*  --   --   --   ALT 49*  --   --   --   ALKPHOS 84  --   --   --   BILITOT 0.7  --   --   --    < > = values in this interval not displayed.     Microbiology Results   Recent Results (from the past 240 hour(s))  Resp Panel by RT-PCR (Flu A&B, Covid) Nasopharyngeal Swab     Status: None   Collection Time: 09/27/21  6:50 PM   Specimen: Nasopharyngeal Swab; Nasopharyngeal(NP) swabs in vial transport medium  Result Value Ref Range Status   SARS Coronavirus 2 by RT PCR NEGATIVE NEGATIVE Final    Comment: (NOTE) SARS-CoV-2 target nucleic acids are NOT DETECTED.  The SARS-CoV-2 RNA is generally detectable in upper respiratory specimens during the acute phase of infection. The lowest concentration of SARS-CoV-2 viral copies this assay can detect is 138 copies/mL. A negative result does not preclude SARS-Cov-2 infection and should not be used as the sole basis for treatment or other patient management decisions. A negative result may occur with  improper specimen collection/handling, submission of specimen other than nasopharyngeal swab, presence of viral mutation(s) within the areas targeted by this assay, and inadequate number of viral copies(<138 copies/mL). A negative result must be combined with clinical observations, patient history, and epidemiological information. The  expected result is Negative.  Fact Sheet for Patients:  EntrepreneurPulse.com.au  Fact Sheet for Healthcare Providers:  IncredibleEmployment.be  This test is no t yet approved or cleared by the [place] FDA and  has been authorized for detection and/or diagnosis of SARS-CoV-2 by FDA under an Emergency Use Authorization (EUA). This EUA will remain  in effect (meaning this test can be used) for the duration of the COVID-19 declaration under Section 564(b)(1) of the Act, 21 U.S.C.section 360bbb-3(b)(1), unless the authorization is terminated  or revoked sooner.       Influenza A by PCR NEGATIVE NEGATIVE Final   Influenza B by PCR NEGATIVE NEGATIVE Final    Comment: (NOTE) The Xpert Xpress SARS-CoV-2/FLU/RSV plus assay is intended as an aid in the diagnosis of influenza from Nasopharyngeal swab specimens and should not be used as a sole basis for treatment. Nasal washings and aspirates are unacceptable for Xpert Xpress SARS-CoV-2/FLU/RSV testing.  Fact Sheet for Patients: EntrepreneurPulse.com.au  Fact Sheet for Healthcare Providers: IncredibleEmployment.be  This test is not yet approved or cleared by the [place] FDA and has been authorized for detection and/or diagnosis of SARS-CoV-2 by FDA under an Emergency Use Authorization (EUA). This EUA will remain in effect (meaning this test can be used) for the duration of the COVID-19 declaration under Section 564(b)(1) of the Act, 21 U.S.C. section 360bbb-3(b)(1), unless the authorization is terminated or revoked.  Performed at [facility], [address] [place], [place] [address]  RADIOLOGY:  No results found.   CODE STATUS:     Code Status Orders  (From admission, onward)           Start     Ordered   09/26/21 1348  Do not attempt resuscitation (DNR)  Continuous       Question Answer Comment  In the event of cardiac  or respiratory ARREST Do not call a code blue   In the event of cardiac or respiratory ARREST Do not perform Intubation, CPR, defibrillation or ACLS   In the event of cardiac or respiratory ARREST Use medication by any route, position, wound care, and other measures to relive pain and suffering. May use oxygen, suction and manual treatment of airway obstruction as needed for comfort.      09/26/21 1347           Code Status History     Date Active Date Inactive Code Status Order ID Comments User Context   09/26/2021 1342 09/26/2021 1347 Full Code [id number]  Nolberto Hanlon, MD Inpatient   09/25/2021 1606 09/26/2021 1342 DNR [id number]  Christel Mormon, MD ED   07/13/2021 1456 07/21/2021 0009 DNR [id number]  Asencion Gowda, NP Inpatient   07/08/2021 1625 07/13/2021 1455 Full Code [id number]  Elmore Guise, MD Inpatient   06/27/2021 0250 07/08/2021 1456 Full Code [id number]  Mansy, Arvella Merles, MD ED   09/30/2019 1657 09/30/2019 2058 Full Code [id number]  Ina Homes, MD ED   09/08/2017 0359 10/07/2017 2150 Full Code [id number]  Collier Salina, MD ED   10/27/2016 2044 10/29/2016 2121 Full Code [id number]  Zada Finders, MD Inpatient   07/13/2015 1652 07/21/2015 1336 Full Code [id number]  Cathlyn Parsons, [name] Inpatient   07/11/2015 1629 07/13/2015 1652 Full Code [id number]  Juluis Mire, MD ED   04/29/2015 1352 04/30/2015 2038 Full Code [id number]  Lucious Groves, DO Inpatient   08/27/2013 0427 08/27/2013 1845 Full Code [id number]  Mal Misty, MD Inpatient        TOTAL TIME TAKING CARE OF THIS PATIENT:40 minutes.    Fritzi Mandes M.D  [facility]    CC: Primary care physician; Patient, [name] (Inactive)

## 2021-09-29 NOTE — Progress Notes (Signed)
[facility]  ROUNDING NOTE   Subjective:   Patient seen and evaluated during dialysis   HEMODIALYSIS FLOWSHEET:  Blood Flow Rate (mL/min): 350 mL/min Arterial Pressure (mmHg): -220 mmHg Venous Pressure (mmHg): 160 mmHg Transmembrane Pressure (mmHg): 60 mmHg Ultrafiltration Rate (mL/min): 670 mL/min Dialysate Flow Rate (mL/min): 500 ml/min Conductivity: Machine : 13.9 Conductivity: Machine : 13.9 Dialysis Fluid Bolus: Normal Saline Bolus Amount (mL): 200 mL  No complaints from the patient at this time HD RN states permcath not running well at current settings  Objective:  Vital signs in last 24 hours:  Temp:  [97.6 F (36.4 C)-98.4 F (36.9 C)] 97.6 F (36.4 C) (12/16 0724) Pulse Rate:  [64-79] 79 (12/16 1145) Resp:  [11-23] 15 (12/16 1245) BP: (90-174)/(54-107) 115/75 (12/16 1245) SpO2:  [95 %-100 %] 100 % (12/16 1130) Weight:  [74.5 kg-78.5 kg] 74.5 kg (12/16 0925)  Weight change: -3 kg Filed Weights   09/28/21 0600 09/29/21 0500 09/29/21 0925  Weight: 78.2 kg 78.5 kg 74.5 kg    Intake/Output: I/O last 3 completed shifts: In: 480 [P.O.:480] Out: -    Intake/Output this shift:  No intake/output data recorded.  Physical Exam: General: NAD, resting comfortable   Head: Normocephalic, atraumatic. Moist oral mucosal membranes  Eyes: Anicteric  Lungs:  Clear to auscultation, normal effort, 2L [place]  Heart: Regular rate and rhythm  Abdomen:  Soft, nontender  Extremities:  no peripheral edema.  Neurologic: Nonfocal, moving all four extremities  Skin: No lesions  Access: Rt thigh Permcath    Basic Metabolic Panel: Recent Labs  Lab 09/25/21 1216 09/26/21 0613 09/27/21 0657 09/28/21 0857  NA 134* 136 134*  --   K 4.3 4.9 5.7* 4.9  CL 96* 97* 97*  --   CO2 29 31 26   --   GLUCOSE 136* 75 98  --   BUN 38* 45* 56*  --   CREATININE 4.40* 5.25* 6.62*  --   CALCIUM 8.4* 8.1* 8.0*  --   PHOS  --  3.9  --   --      Liver Function Tests: Recent  Labs  Lab 09/25/21 1216  AST 50*  ALT 49*  ALKPHOS 84  BILITOT 0.7  PROT 7.0  ALBUMIN 3.2*    No results for input(s): LIPASE, AMYLASE in the last 168 hours. No results for input(s): AMMONIA in the last 168 hours.  CBC: Recent Labs  Lab 09/25/21 1216 09/26/21 1219  WBC 7.9 6.4  NEUTROABS 5.2  --   HGB 11.5* 10.9*  HCT 34.6* 33.1*  MCV 84.6 84.7  PLT 186 180     Cardiac Enzymes: No results for input(s): CKTOTAL, CKMB, CKMBINDEX, TROPONINI in the last 168 hours.  BNP: Invalid input(s): POCBNP  CBG: Recent Labs  Lab 09/25/21 1140 09/25/21 2008  GLUCAP 132* 116*     Microbiology: Results for orders placed or performed during the hospital encounter of 09/25/21  Resp Panel by RT-PCR (Flu A&B, Covid) Nasopharyngeal Swab     Status: None   Collection Time: 09/27/21  6:50 PM   Specimen: Nasopharyngeal Swab; Nasopharyngeal(NP) swabs in vial transport medium  Result Value Ref Range Status   SARS Coronavirus 2 by RT PCR NEGATIVE NEGATIVE Final    Comment: (NOTE) SARS-CoV-2 target nucleic acids are NOT DETECTED.  The SARS-CoV-2 RNA is generally detectable in upper respiratory specimens during the acute phase of infection. The lowest concentration of SARS-CoV-2 viral copies this assay can detect is 138 copies/mL. A negative result does  not preclude SARS-Cov-2 infection and should not be used as the sole basis for treatment or other patient management decisions. A negative result may occur with  improper specimen collection/handling, submission of specimen other than nasopharyngeal swab, presence of viral mutation(s) within the areas targeted by this assay, and inadequate number of viral copies(<138 copies/mL). A negative result must be combined with clinical observations, patient history, and epidemiological information. The expected result is Negative.  Fact Sheet for Patients:  EntrepreneurPulse.com.au  Fact Sheet for Healthcare Providers:   IncredibleEmployment.be  This test is no t yet approved or cleared by the [place] FDA and  has been authorized for detection and/or diagnosis of SARS-CoV-2 by FDA under an Emergency Use Authorization (EUA). This EUA will remain  in effect (meaning this test can be used) for the duration of the COVID-19 declaration under Section 564(b)(1) of the Act, 21 U.S.C.section 360bbb-3(b)(1), unless the authorization is terminated  or revoked sooner.       Influenza A by PCR NEGATIVE NEGATIVE Final   Influenza B by PCR NEGATIVE NEGATIVE Final    Comment: (NOTE) The Xpert Xpress SARS-CoV-2/FLU/RSV plus assay is intended as an aid in the diagnosis of influenza from Nasopharyngeal swab specimens and should not be used as a sole basis for treatment. Nasal washings and aspirates are unacceptable for Xpert Xpress SARS-CoV-2/FLU/RSV testing.  Fact Sheet for Patients: EntrepreneurPulse.com.au  Fact Sheet for Healthcare Providers: IncredibleEmployment.be  This test is not yet approved or cleared by the [place] FDA and has been authorized for detection and/or diagnosis of SARS-CoV-2 by FDA under an Emergency Use Authorization (EUA). This EUA will remain in effect (meaning this test can be used) for the duration of the COVID-19 declaration under Section 564(b)(1) of the Act, 21 U.S.C. section 360bbb-3(b)(1), unless the authorization is terminated or revoked.  Performed at [facility], [address] [place], [place] [address]     Coagulation Studies: No results for input(s): LABPROT, INR in the last 72 hours.   Urinalysis: No results for input(s): COLORURINE, LABSPEC, PHURINE, GLUCOSEU, HGBUR, BILIRUBINUR, KETONESUR, PROTEINUR, UROBILINOGEN, NITRITE, LEUKOCYTESUR in the last 72 hours.  Invalid input(s): APPERANCEUR    Imaging: No results found.   Medications:       stroke: mapping our early stages of  recovery book   Does not apply Once   amLODipine  5 mg Oral Daily   aspirin EC  81 mg Oral Daily   atorvastatin  40 mg Oral Daily   Chlorhexidine Gluconate Cloth  6 each Topical Daily   feeding supplement (NEPRO CARB STEADY)  237 mL Oral BID BM   heparin  5,000 Units Subcutaneous BID   levETIRAcetam  500 mg Oral Once per day on Mon Wed Fri   levETIRAcetam  500 mg Oral BID   mirtazapine  15 mg Oral QHS   multivitamin  1 tablet Oral QHS   pantoprazole  40 mg Oral Daily   polyethylene glycol  17 g Oral Daily   senna-docusate  1 tablet Oral QHS   sertraline  25 mg Oral QHS   sevelamer carbonate  800 mg Oral TID WC     Assessment/ Plan:  Ernest [name] is a 75 y.o.  male  with medical problems of  ESRD, COPD, chronic systolic CHF, coronary artery disease status post CABG and mitral valve repair in 2018, depression, gout, hepatitis C, history of seizures, hyperlipidemia, stroke, history of diabetes, severe diffuse calcified atherosclerosis, chronic small vessel ischemic changes and history  of indeterminate lacunar infarct in the brain  was admitted on 09/25/2021 for TIA (transient ischemic attack) [G45.9] Slurred speech [R47.81] Chronic respiratory failure with hypoxia (HCC) [J96.11] Acute encephalopathy [G93.40]  [facility] [address]/MWF/Rt groin Permcath   End stage renal disease on dialysis. Will maintain outpatient scheduled, if possible. Receiving dialysis at this time. UF goal 1.5L as tolerated. BFR reduced to 350 tolerated by permcath. Next treatment scheduled for Monday.   2. Anemia of chronic kidney disease Lab Results  Component Value Date   HGB 10.9 (L) 09/26/2021    Will continue to monitor  3. Secondary Hyperparathyroidism:  Lab Results  Component Value Date   PTH 63 10/05/2017   PTH Comment 10/05/2017   CALCIUM 8.0 (L) 09/27/2021   CAION 1.05 (L) 07/12/2020   PHOS 3.9 09/26/2021    Monitoring bone minerals during this admission  4. Hypertension with  chronic kidney disease.  Currently receiving Amlodipine.  Blood pressure 94/58 during dialysis    LOS: 4   12/16/20221:01 PM

## 2021-10-02 DIAGNOSIS — R4781 Slurred speech: Secondary | ICD-10-CM | POA: Insufficient documentation

## 2021-10-02 DIAGNOSIS — J9611 Chronic respiratory failure with hypoxia: Secondary | ICD-10-CM | POA: Insufficient documentation

## 2021-10-25 NOTE — Progress Notes (Addendum)
NEUROLOGY FOLLOW UP OFFICE NOTE  Ernest [name] [id number]  Assessment/Plan:   1.  Hypoxic encephalopathy 2.  Late-symptomatic seizure secondary to CVA 3.  History of CVA 4.  Type 2 diabetes mellitus 5.  Hypertension 6.  Hyperlipidemia 7.  ESRD on dialysis 8.  CAD 9.  Major vascular neurocognitive disorder   1.  For seizure prophylaxis:  Keppra 500mg  TID on M/W/F (afternoon dose after dialysis) and BID on Tues/Thur/Sat/Sun   2.  Secondary stroke prevention as managed by PCP:             ASA 81mg  daily             Statin therapy (LDL goal less than 70)             Blood pressure and glycemic control 3.  Follow up in one year     Subjective:    Ernest [name] is a 76 year old right-handed man with hyperlipidemia, ESRD on HD (M/W/F), type 2 diabetes mellitus, hypertension, COPD, gout, chronic CHF, atrial flutter, coronary artery disease, hepatitis C, penile implant (unable to get MRI) and history of stroke who follows up for seizures and recent TIA.   UPDATE: Current medication:  Keppra 500mg  TID on M/W/F and BID on T/R/Sat/Sun     He was hospitalized in September for gangrene and osteomyelitis complicated by sepsis.  He subsequently required right AKA.  He has had a cognitive decline since then.  Last month, he developed worsening slurred speech and started sliding out of his chair during dialysis. He wasn't hypotensive but O2 was as low as 83%.  He was admitted for stroke workup.  CT head showed chronic small vessel ischemic changes with diffuse severe calcified atherosclerosis and small age indeterminate lacunar infarct in the medial right thalamus.  Follow up MRI of brain revealed no acute stroke.   Bilateral carotid ultrasound showed less than 50% bilateral ICA stenosis.  EEG was normal.  Delirium due to hypoxia was suspected.     HISTORY: Around October 2015, he developed slurred speech.  He also experienced ringing in his right ear, with sensation of water in his right ear.   He did not seek medical attention at the time.  There was no facial droop or difficulty walking or swallowing.  He decided to go to the ED on 09/21/14 for an evaluation as the slurred speech and tinnitus persisted.  He denied weakness but ED exam revealed right arm weakness.  CT of the head was performed, which revealed chronic small vessel ischemic changes, including remote right cerebellar, left pontine and right basal ganglia lacunar infarcts.  No acute infarction was seen.     On 04/25/15, he developed right hand cramping and dystonia.  When EMS arrived, he had jerking of the right hand that then generalized to a tonic-clonic seizure.  He had two other seizures, in the ambulance and in the ED, lasting 30-40 seconds.  CT of the head showed chronic small vessel disease and moderate brain atrophy.  He was started on Keppra 500mg  twice daily.    He was admitted to [facility] on 04/29/15 for seizure, which was thought to be secondary to hyperglycemia with a blood glucose level of 662.  MRI of the brain was withheld because he had a penile implant that contains metal.  Keppra was increased to 1000mg  daily with 500mg  following each dialysis.   He was re-admitted to [facility] again on 07/11/15 for presumed acute right pontine infarct.  He presented  with diplopia and unsteady gait.  On exam, he was found to have one and a half syndrome.  CT of head and repeat CT of head did not reveal acute infarct. 2D echo showed EF 45-50% with grade 1 diastolic dysfunction.  Carotid doppler showed no hemodynamically significant ICA stenosis    He was admitted to [facility] from 10/27/16 to 10/29/16 for 2 to 3 days of persistent left leg weakness.  He did not have other associated symptoms, such as other extremity weakness, numbness, visual disturbance, unsteady gait or worsening of residual dysarthria.  Serum glucose was 263.  CT of head was personally reviewed and was negative for acute findings.  Unable to obtain MRI due to metal  penile implant, so repeat head the CT on the following day was also negative.  Carotid doppler was negative for hemodynamically significant ICA stenosis.  LDL was 50.  Hgb A1c was elevated at 8.7.  Weakness was thought to be due to hypotension during HD.  He was advised to stop Lisinopril but otherwise continue is current regimen with dual antiplatelet therapy.  PAST MEDICAL HISTORY: Past Medical History:  Diagnosis Date   Abnormal stress test    s/p cath November 2013 with modest disease involving the ostial left main, proximal LAD, proximal RCA - do not appear to be hemodynamically signficant; mild LV dysfunction   Anemia    low iron   Anxiety    Arthritis    Bacteremia    Chronic systolic CHF (congestive heart failure) (HCC)    COPD (chronic obstructive pulmonary disease) ([place])    Coronary artery disease    a. per cath report 2013, b. 09/13/2017-CABG X4 & Mitral valve repair   Depression    DVT (deep venous thrombosis) (HCC)    arm - right- finished blood thinner- not sure when it   Ejection fraction < 50%    Erectile dysfunction    penile implant   ESRD (end stage renal disease) ([place])    [facility] on a MWF schedule.      Gout    Hx: of   Hepatitis C    History of seizures    last one 2018   Hyperlipidemia    Hypertension    Mitral valve disease    s/p Mitral repair 09/13/17   Obesity    Retinopathy    Shortness of breath    Hx: of with exertion   Stroke [facility])    "several" left side weakness, speech affected   Type II or unspecified type diabetes mellitus without mention of complication, not stated as uncontrolled    adult onset    MEDICATIONS: Current Outpatient Medications on File Prior to Visit  Medication Sig Dispense Refill   acetaminophen (TYLENOL) 500 MG tablet Take 1,000 mg by mouth every 6 (six) hours as needed for mild pain.     Amino Acids-Protein Hydrolys (FEEDING SUPPLEMENT, PRO-STAT 64,) LIQD Take 30 mLs by mouth 3 (three) times daily with meals.      amLODipine (NORVASC) 5 MG tablet Take 1 tablet (5 mg total) by mouth daily. 30 tablet 3   aspirin EC 81 MG tablet Take 1 tablet (81 mg total) by mouth daily. (Patient not taking: Reported on 09/25/2021) 30 tablet 11   atorvastatin (LIPITOR) 40 MG tablet Take 40 mg by mouth at bedtime. (Patient not taking: Reported on 09/25/2021)     levETIRAcetam (KEPPRA) 500 MG tablet Take 500 mg by mouth 2 (two) times daily. Only on Tuesday, Thursday,  Saturday, Sunday     levETIRAcetam (KEPPRA) 500 MG tablet Take 500 mg by mouth 3 (three) times daily. On MWF - administer lunchtime dose once returns from dialysis     multivitamin (RENA-VIT) TABS tablet Take 1 tablet by mouth at bedtime. 30 tablet 0   Nutritional Supplements (FEEDING SUPPLEMENT, NEPRO CARB STEADY,) LIQD Take 237 mLs by mouth 2 (two) times daily between meals.  0   pantoprazole (PROTONIX) 40 MG tablet Take 1 tablet (40 mg total) by mouth daily. 30 tablet 0   polyethylene glycol (MIRALAX / GLYCOLAX) 17 g packet Take 17 g by mouth daily. Dissolve in 4-8 ounces of water     senna-docusate (SENOKOT-S) 8.6-50 MG tablet Take 1 tablet by mouth at bedtime.     sertraline (ZOLOFT) 25 MG tablet Take 25 mg by mouth at bedtime.     sevelamer carbonate (RENVELA) 800 MG tablet Take 800 mg by mouth 3 (three) times daily with meals. On Monday, Wednesday, and Friday. Give NOON dose on MWF asap when returning from dialysis     sevelamer carbonate (RENVELA) 800 MG tablet Take 800 mg by mouth 3 (three) times daily with meals. On Tuesday, Thursday, Saturday, and Sunday (non-dialysis days)     No current facility-administered medications on file prior to visit.    ALLERGIES: [name]  FAMILY HISTORY: Family History  Problem Relation Age of Onset   Heart disease Father    Diabetes Sister    Diabetes Brother    Diabetes Son       Objective:  Blood pressure (!) 144/77, pulse 84, height 5\' 9"  (1.753 m), weight 165 lb (74.8 kg), SpO2 (!) 83 %. General:  No acute distress.  Patient appears well-groomed.   Head:  Normocephalic/atraumatic Eyes:  Fundi examined but not visualized Neck: supple, no paraspinal tenderness, full range of motion Heart:  Regular rate and rhythm Lungs:  Clear to auscultation bilaterally Back: No paraspinal tenderness Neurological Exam: alert and oriented to person, place, but not time (2021).  Speech fluent and not dysarthric, language intact.  CN II-XII intact. atrophy muscle strength 4+ intrinsic hand muscles, otherwise 5/5 throughout.  Sensation to light touch intact.  Deep tendon reflexes 2+ throughout, in wheelchair.   Metta Clines, DO  CC: Brayton Mars, MD

## 2021-10-26 ENCOUNTER — Encounter: Payer: Self-pay | Admitting: Neurology

## 2021-10-26 ENCOUNTER — Other Ambulatory Visit: Payer: Self-pay

## 2021-10-26 ENCOUNTER — Ambulatory Visit (INDEPENDENT_AMBULATORY_CARE_PROVIDER_SITE_OTHER): Payer: Medicare (Managed Care) | Admitting: Neurology

## 2021-10-26 VITALS — BP 144/77 | HR 84 | Ht 69.0 in | Wt 165.0 lb

## 2021-10-26 DIAGNOSIS — E785 Hyperlipidemia, unspecified: Secondary | ICD-10-CM

## 2021-10-26 DIAGNOSIS — E1165 Type 2 diabetes mellitus with hyperglycemia: Secondary | ICD-10-CM | POA: Diagnosis not present

## 2021-10-26 DIAGNOSIS — E1122 Type 2 diabetes mellitus with diabetic chronic kidney disease: Secondary | ICD-10-CM

## 2021-10-26 DIAGNOSIS — Z992 Dependence on renal dialysis: Secondary | ICD-10-CM

## 2021-10-26 DIAGNOSIS — I639 Cerebral infarction, unspecified: Secondary | ICD-10-CM | POA: Diagnosis not present

## 2021-10-26 DIAGNOSIS — I120 Hypertensive chronic kidney disease with stage 5 chronic kidney disease or end stage renal disease: Secondary | ICD-10-CM

## 2021-10-26 DIAGNOSIS — I69398 Other sequelae of cerebral infarction: Secondary | ICD-10-CM | POA: Diagnosis not present

## 2021-10-26 DIAGNOSIS — N186 End stage renal disease: Secondary | ICD-10-CM

## 2021-10-26 DIAGNOSIS — F0390 Unspecified dementia without behavioral disturbance: Secondary | ICD-10-CM

## 2021-10-26 DIAGNOSIS — R569 Unspecified convulsions: Secondary | ICD-10-CM

## 2021-10-26 DIAGNOSIS — E1169 Type 2 diabetes mellitus with other specified complication: Secondary | ICD-10-CM

## 2021-10-26 DIAGNOSIS — G931 Anoxic brain damage, not elsewhere classified: Secondary | ICD-10-CM

## 2021-10-26 MED ORDER — LEVETIRACETAM 500 MG PO TABS
500.0000 mg | ORAL_TABLET | Freq: Three times a day (TID) | ORAL | 5 refills | Status: DC
Start: 2021-10-26 — End: 2022-06-22

## 2021-10-26 MED ORDER — LEVETIRACETAM 500 MG PO TABS
500.0000 mg | ORAL_TABLET | Freq: Two times a day (BID) | ORAL | 5 refills | Status: DC
Start: 2021-10-26 — End: 2022-06-22

## 2021-10-26 NOTE — Patient Instructions (Signed)
Take levetiracetam 500mg  tablet: Three times daily on Monday, Wednesday, Friday (afternoon dose after dialysis) Twice daily on Tuesday, Thursday, Saturday, Sunday

## 2021-11-06 NOTE — Progress Notes (Signed)
Cardiology Office Note    Date:  11/09/2021   ID:  [name], DOB August 31, 1946, MRN [id number]  PCP:  Ernest Mars, MD  Cardiologist:  None (has never been seen by cardiology MD in the outpatient setting; last seen in the office in 11/2018) Electrophysiologist:  None   Chief Complaint: Reestablish care  History of Present Illness:   Ernest [name] is a 76 y.o. male with history of CAD status post four-vessel CABG and mitral valve repair on 09/13/2017, chronic combined systolic and diastolic CHF, NSVT, persistent atrial flutter previously on amiodarone and Coumadin with plans for DCCV though patient was not consistent with Coumadin monitoring, ESRD on HD (M/W/F), CVA complicated by seizure, PAD complicated by gangrene and sepsis leading to right AKA in 06/2021, DM2, HTN, HLD, and COPD who presents for reestablishment of care.  LHC in 08/2017 demonstrated multivessel CAD as outlined below with recommendation to pursue CABG.  Echo at that time demonstrated an EF of 45 to 50% which was similar to echo from 2016, inferobasal and posterior lateral hypokinesis, mild mitral regurgitation, and a PASP of 50 mmHg.  He subsequently underwent four-vessel CABG and mitral valve repair on 09/13/2017.  He has not undergone ischemic testing since.  He was last seen by [facility] on 12/02/2018.  At that time, he was uncertain what medications he was taking.  He was felt to have some atypical chest pain.  Volume status was managed by hemodialysis.  It was noted Coumadin had been discontinued due to noncompliance and he was not a candidate for DOAC with prior mitral valve repair.  Follow-up was recommended in 6 months, though he has been lost to follow-up since.  He was admitted to the hospital in 06/2021 with gangrene and osteomyelitis complicated by sepsis with subsequent right AKA.  Echo during that admission demonstrated an EF of 50 to 55%, moderate LVH, grade 2 diastolic dysfunction, elevated left  atrial pressure, normal RV systolic function and ventricular cavity size, mild biatrial enlargement, trivial mitral valve regurgitation with a degenerative mitral valve and moderate to severe mitral annular calcification, tricuspid aortic valve with mild calcification/sclerosis without evidence of stenosis, borderline dilatation of the aortic root measuring 38 mm, and an estimated right atrial pressure of 8 mmHg.  He was most recently admitted to the hospital in 09/2021 for suspected TIA.  MRI of the brain was without acute findings.  EEG was negative.  Carotid artery ultrasound showed no significant stenosis bilaterally.  He was evaluated by neurology with findings of severe chronic small vessel ischemic disease, prior infarcts, along with multiple punctate foci of susceptibility which were felt to possibly represent sequela of hypertensive microhemorrhages.  EKG showed sinus rhythm.  BNP 1494.  High-sensitivity troponin 28.  Amiodarone was discontinued by internal medicine.  Discharge summary indicates the patient had also discussed with internal medicine that he wanted to discontinue dialysis leading to palliative care consultation.  Prior to discharge he agreed to resume hemodialysis.  There was also concern for delirium related to hypoxia.  He reports he is doing well from a cardiac perspective.  He denies any chest pain, dyspnea, palpitations, dizziness, presyncope, or syncope.  No swelling in the left lower extremity or stump of the right lower extremity.  He remains adherent to hemodialysis.  He has stable two-pillow orthopnea.  He is watching his salt intake.  He denies any falls, hematochezia, or melena.  He has been off Coumadin for at least 2 to 3 years secondary to  nonadherence.  The worker that has accompanied him from his nursing home is uncertain if the facility would be able to bring him to our office on Wednesdays for INR checks if we were to resume Coumadin.   Labs independently  reviewed: 09/2021 - potassium 4.9, BUN 56, serum creatinine 6.62, Hgb 10.9, PLT 180, TC 122, TG 48, HDL 45, LDL 67, A1c 7.0 06/2021 - TSH normal  Past Medical History:  Diagnosis Date   Abnormal stress test    s/p cath November 2013 with modest disease involving the ostial left main, proximal LAD, proximal RCA - do not appear to be hemodynamically signficant; mild LV dysfunction   Anemia    low iron   Anxiety    Arthritis    Bacteremia    Chronic systolic CHF (congestive heart failure) (HCC)    COPD (chronic obstructive pulmonary disease) ([place])    Coronary artery disease    a. per cath report 2013, b. 09/13/2017-CABG X4 & Mitral valve repair   Depression    DVT (deep venous thrombosis) (HCC)    arm - right- finished blood thinner- not sure when it   Ejection fraction < 50%    Erectile dysfunction    penile implant   ESRD (end stage renal disease) ([place])    [facility] on a MWF schedule.      Gout    Hx: of   Hepatitis C    History of seizures    last one 2018   Hyperlipidemia    Hypertension    Mitral valve disease    s/p Mitral repair 09/13/17   Obesity    Retinopathy    Shortness of breath    Hx: of with exertion   Stroke [facility])    "several" left side weakness, speech affected   Type II or unspecified type diabetes mellitus without mention of complication, not stated as uncontrolled    adult onset    Past Surgical History:  Procedure Laterality Date   A/V FISTULAGRAM N/A 10/04/2017   Procedure: A/V FISTULAGRAM;  Surgeon: Elam Dutch, MD;  Location: [place] [facility];  Service: Cardiovascular;  Laterality: N/A;   A/V SHUNTOGRAM Left 07/12/2020   Procedure: A/V SHUNTOGRAM;  Surgeon: Serafina Mitchell, MD;  Location: [place] [facility];  Service: Cardiovascular;  Laterality: Left;   AMPUTATION Right 07/08/2021   Procedure: AMPUTATION ABOVE KNEE;  Surgeon: Elmore Guise, MD;  Location: [facility];  Service: Vascular;  Laterality: Right;   ANGIOPLASTY Left 09/24/2019    Procedure: Angioplasty Innominate Vien;  Surgeon: Serafina Mitchell, MD;  Location: [place];  Service: Vascular;  Laterality: Left;   AV FISTULA PLACEMENT Left 01/13/2013   Procedure: INSERTION OF ARTERIOVENOUS (AV) GORE-TEX GRAFT ARM;  Surgeon: Angelia Mould, MD;  Location: [place];  Service: Vascular;  Laterality: Left;   AV FISTULA PLACEMENT Left 05/05/2013   Procedure: INSERTION OF LEFT UPPER ARM  ARTERIOVENOUS GORTEX GRAFT;  Surgeon: Angelia Mould, MD;  Location: [place];  Service: Vascular;  Laterality: Left;   AV FISTULA PLACEMENT Left 11/26/2019   Procedure: INSERTION OF ARTERIOVENOUS (AV) GORE-TEX GRAFT, left  ARM;  Surgeon: Serafina Mitchell, MD;  Location: [place];  Service: Vascular;  Laterality: Left;   [place] REMOVAL Left 03/26/2013   Procedure: REMOVAL OF  NON INCORPORATED ARTERIOVENOUS GORETEX GRAFT ([place]) left arm * repair of  left brachial artery with vein patch angioplasty.;  Surgeon: Angelia Mould, MD;  Location: [place];  Service: Vascular;  Laterality: Left;   CARDIAC CATHETERIZATION  August 26, 2012   CATARACT EXTRACTION W/ INTRAOCULAR LENS  IMPLANT, BILATERAL     COLONOSCOPY     CORONARY ARTERY BYPASS GRAFT N/A 09/13/2017   Procedure: CORONARY ARTERY BYPASS GRAFTING (CABG) times four LIMA  to LAD, SVG sequentially to OM and RAMUS Intermediate and SVG to Acute Marginal;  Surgeon: Melrose Nakayama, MD;  Location: [place];  Service: Open Heart Surgery;  Laterality: N/A;   DIALYSIS/PERMA CATHETER INSERTION N/A 02/28/2021   Procedure: DIALYSIS/PERMA CATHETER EXCHANGE;  Surgeon: Katha Cabal, MD;  Location: [place] [facility];  Service: Cardiovascular;  Laterality: N/A;   EMBOLECTOMY Right 08/27/2013   Procedure: EMBOLECTOMY;  Surgeon: Mal Misty, MD;  Location: [place];  Service: Vascular;  Laterality: Right;  Thrombectomy of Radial and ulnar artery.   ENDOVASCULAR STENT INSERTION Left 09/24/2019   Procedure: Endovascular Stent Graft Insertion, Arm Graft;   Surgeon: Serafina Mitchell, MD;  [facility];  Service: Vascular;  Laterality: Left;   EYE SURGERY     laser.  and surgery for DM   fistula     RUE and wrist   FISTULOGRAM Left 09/24/2019   Procedure: SHUNTOGRAM OF ARTERIOVENOUS FISTULA LEFT ARM,;  Surgeon: Serafina Mitchell, MD;  Location: [place];  Service: Vascular;  Laterality: Left;   INSERTION OF DIALYSIS CATHETER Right 01/01/2013   Procedure: INSERTION OF DIALYSIS CATHETER right  internal jugular;  Surgeon: Serafina Mitchell, MD;  Location: [place];  Service: Vascular;  Laterality: Right;   INSERTION OF DIALYSIS CATHETER N/A 03/26/2013   Procedure: INSERTION OF DIALYSIS CATHETER Left internal jugular vein;  Surgeon: Angelia Mould, MD;  Location: [place];  Service: Vascular;  Laterality: N/A;   INSERTION OF DIALYSIS CATHETER Right 11/26/2019   Procedure: INSERTION OF DIALYSIS CATHETER, right internal jugular;  Surgeon: Serafina Mitchell, MD;  Location: [place];  Service: Vascular;  Laterality: Right;   IR THORACENTESIS ASP PLEURAL SPACE W/IMG GUIDE  09/30/2019   LEFT HEART CATH AND CORONARY ANGIOGRAPHY N/A 09/09/2017   Procedure: LEFT HEART CATH AND CORONARY ANGIOGRAPHY;  Surgeon: Troy Sine, MD;  Location: [place] [facility];  Service: Cardiovascular;  Laterality: N/A;   LIGATION OF ARTERIOVENOUS  FISTULA Right 12/30/2013   Procedure: REMOVAL OF SEGMENT OF GORTEX GRAFT AND FISTULA  AND REPAIR OF BRACHIAL ARTERY;  Surgeon: Mal Misty, MD;  Location: [place];  Service: Vascular;  Laterality: Right;   LOWER EXTREMITY ANGIOGRAPHY N/A 06/28/2021   Procedure: Lower Extremity Angiography;  Surgeon: Katha Cabal, MD;  Location: [place] [facility];  Service: Cardiovascular;  Laterality: N/A;   MITRAL VALVE REPAIR N/A 09/13/2017   Procedure: MITRAL VALVE REPAIR (MVR);  Surgeon: Melrose Nakayama, MD;  Location: [place];  Service: Open Heart Surgery;  Laterality: N/A;   MITRAL VALVE REPAIR (MV)/CORONARY ARTERY BYPASS GRAFTING (CABG)   09/13/2017   PENILE PROSTHESIS IMPLANT     1997.. no card   PERIPHERAL VASCULAR BALLOON ANGIOPLASTY Left 10/04/2017   Procedure: PERIPHERAL VASCULAR BALLOON ANGIOPLASTY;  Surgeon: Elam Dutch, MD;  Location: [place] [facility];  Service: Cardiovascular;  Laterality: Left;  AVF   PERIPHERAL VASCULAR BALLOON ANGIOPLASTY Left 07/12/2020   Procedure: PERIPHERAL VASCULAR BALLOON ANGIOPLASTY;  Surgeon: Serafina Mitchell, MD;  Location: [place] [facility];  Service: Cardiovascular;  Laterality: Left;  ARM SHUNTOGRAM   REVISION OF ARTERIOVENOUS GORETEX GRAFT Left 09/24/2019   Procedure: REVISION OF ARTERIOVENOUS GORETEX GRAFT LEFT  ARM;  Surgeon: Serafina Mitchell, MD;  Location: [facility] OR;  Service: Vascular;  Laterality: Left;   TEE WITHOUT CARDIOVERSION N/A 06/11/2013   Procedure: TRANSESOPHAGEAL ECHOCARDIOGRAM (TEE);  Surgeon: Larey Dresser, MD;  Location: [facility] ENDOSCOPY;  Service: Cardiovascular;  Laterality: N/A;   TEE WITHOUT CARDIOVERSION N/A 09/13/2017   Procedure: TRANSESOPHAGEAL ECHOCARDIOGRAM (TEE);  Surgeon: Melrose Nakayama, MD;  Location: [place];  Service: Open Heart Surgery;  Laterality: N/A;   THROMBECTOMY W/ EMBOLECTOMY Left 03/26/2013   Procedure: Attempted thrombectomy of left arm arteriovenous goretex graft.;  Surgeon: Angelia Mould, MD;  Location: [facility] OR;  Service: Vascular;  Laterality: Left;   ULTRASOUND GUIDANCE FOR VASCULAR ACCESS Bilateral 09/24/2019   Procedure: Ultrasound Guidance For Vascular Access, Right Femoral Vein and Left Arm Graft;  Surgeon: Serafina Mitchell, MD;  Location: [facility] OR;  Service: Vascular;  Laterality: Bilateral;   VENOGRAM N/A 04/07/2013   Procedure: VENOGRAM;  Surgeon: Serafina Mitchell, MD;  Location: [facility] CATH LAB;  Service: Cardiovascular;  Laterality: N/A;    Current Medications: Current Meds  Medication Sig   acetaminophen (TYLENOL) 500 MG tablet Take 1,000 mg by mouth every 6 (six) hours as needed for mild pain.   Amino Acids-Protein Hydrolys  (FEEDING SUPPLEMENT, PRO-STAT 64,) LIQD Take 30 mLs by mouth 3 (three) times daily with meals.   amLODipine (NORVASC) 5 MG tablet Take 1 tablet (5 mg total) by mouth daily.   aspirin EC 81 MG tablet Take 1 tablet (81 mg total) by mouth daily.   atorvastatin (LIPITOR) 40 MG tablet Take 40 mg by mouth at bedtime.   levETIRAcetam (KEPPRA) 500 MG tablet Take 1 tablet (500 mg total) by mouth 3 (three) times daily. On MWF - administer lunchtime dose once returns from dialysis   multivitamin (RENA-VIT) TABS tablet Take 1 tablet by mouth at bedtime.   Nutritional Supplements (FEEDING SUPPLEMENT, NEPRO CARB STEADY,) LIQD Take 237 mLs by mouth 2 (two) times daily between meals.   pantoprazole (PROTONIX) 40 MG tablet Take 1 tablet (40 mg total) by mouth daily.   polyethylene glycol (MIRALAX / GLYCOLAX) 17 g packet Take 17 g by mouth daily. Dissolve in 4-8 ounces of water   senna-docusate (SENOKOT-S) 8.6-50 MG tablet Take 1 tablet by mouth at bedtime.   sertraline (ZOLOFT) 25 MG tablet Take 25 mg by mouth at bedtime.   sevelamer carbonate (RENVELA) 800 MG tablet Take 800 mg by mouth 3 (three) times daily with meals. On Monday, Wednesday, and Friday. Give NOON dose on MWF asap when returning from dialysis   sevelamer carbonate (RENVELA) 800 MG tablet Take 800 mg by mouth 3 (three) times daily with meals. On Tuesday, Thursday, Saturday, and Sunday (non-dialysis days)    Allergies:   Patient has no known allergies.   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   Vaping Use   Vaping Use: Never used  Substance and Sexual  Activity   Alcohol use: No    Alcohol/week: 0.0 standard drinks   Drug use: No   Sexual activity: Not on file  Other Topics Concern   Not on file  Social History Narrative   Adopted mentally retarded child at home, 2 adopted children, 3 living and 2 deceased.   Social Determinants of Health   Financial Resource Strain: Not on file  Food Insecurity: Not on file  Transportation Needs: Not on file  Physical Activity: Not on file  Stress: Not on file  Social Connections: Not on file     Family History:  The patient's family history includes Diabetes in his brother, sister, and son; Heart disease in his father.  ROS:   Review of Systems  Constitutional:  Positive for malaise/fatigue. Negative for chills, diaphoresis, fever and weight loss.  HENT:  Negative for congestion.   Eyes:  Negative for discharge and redness.  Respiratory:  Negative for cough, sputum production, shortness of breath and wheezing.   Cardiovascular:  Negative for chest pain, palpitations, orthopnea, claudication, leg swelling and PND.  Gastrointestinal:  Negative for abdominal pain, blood in stool, heartburn, melena, nausea and vomiting.  Musculoskeletal:  Negative for falls and myalgias.  Skin:  Negative for rash.  Neurological:  Positive for weakness. Negative for dizziness, tingling, tremors, sensory change, speech change, focal weakness and loss of consciousness.  Endo/Heme/Allergies:  Does not bruise/bleed easily.  Psychiatric/Behavioral:  Negative for substance abuse. The patient is not nervous/anxious.   All other systems reviewed and are negative.   EKGs/Labs/Other Studies Reviewed:    Studies reviewed were summarized above. The additional studies were reviewed today:  2D echo 06/27/2021: 1. Left ventricular ejection fraction, by estimation, is 50 to 55%. The  left ventricle has low normal function. Left ventricular endocardial  border  not optimally defined to evaluate regional wall motion. There is  moderate left ventricular hypertrophy.  Left ventricular diastolic parameters are consistent with Grade II  diastolic dysfunction (pseudonormalization). Elevated left atrial  pressure.   2. Right ventricular systolic function is normal. The right ventricular  size is normal. Tricuspid regurgitation signal is inadequate for assessing  PA pressure.   3. Left atrial size was mildly dilated.   4. Right atrial size was mildly dilated.   5. The mitral valve is degenerative. Trivial mitral valve regurgitation.  No evidence of mitral stenosis. Moderate to severe mitral annular  calcification.   6. The aortic valve is tricuspid. There is mild calcification of the  aortic valve. There is mild thickening of the aortic valve. Aortic valve  regurgitation is not visualized. Mild to moderate aortic valve  sclerosis/calcification is present, without any  evidence of aortic stenosis.   7. Aortic dilatation noted. There is borderline dilatation of the aortic  root, measuring 38 mm.   8. The inferior vena cava is normal in size with <50% respiratory  variability, suggesting right atrial pressure of 8 mmHg. __________  TEE 09/13/2017:  Septum: No Patent Foramen Ovale present.   Left atrium: Patent foramen ovale not present.   Left atrium: Cavity is mildly to moderately dilated. Systolic blunting  of pulmonary venous inflow. Spontaneous echo contrast.   Aortic valve: The valve is trileaflet. Mild valve calcification present.  Mildly decreased leaflet separation. No stenosis. No regurgitation.   Mitral valve: Dilated mitral annulus. No leaflet thickening and  calcification present. Moderate to severe regurgitation.   Right ventricle: Normal wall thickness. Cavity is mildly to moderately  dilated. Moderately reduced systolic function.  RV systolic function is  decreased moderately Abnormal tricuspid annular plane systolic excursion   (TAPSE) < 1.7cm. No thrombus present. No mass present. Catheter present in  the ventricle.   Right atrium: Cavity is mildly dilated. Interatrial septum bows to the  left, consistent with elevated right atrial pressure.   Tricuspid valve: Valve has a dilated annulus. Mild to moderate  regurgitation.  __________  Pre-CABG carotid and upper/lower extremity ultrasound 09/10/2017: Right Carotid: There is evidence in the right ICA of a 1-39% stenosis.   Left Carotid: There is evidence in the left ICA of a 1-39% stenosis.  Vertebrals: Both vertebral arteries were patent with antegrade flow.      Right Upper Extremity: Obliterates with radial compression, normal with  ulnar compression.  Left Upper Extremity: Normal with radial and ulnar compression.  __________  LHC 09/09/2017: Ost LM lesion is 65% stenosed. Mid LM lesion is 90% stenosed. Ost Cx to Prox Cx lesion is 100% stenosed. Ost LAD lesion is 20% stenosed. Acute Mrg lesion is 60% stenosed.   Significant coronary calcification with high-grade 65% ostial and 90-95% distal left main stenosis, calcification of the proximal LAD with 20-25% narrowing, large ramus intermediate vessel; total occlusion of the left circumflex at the ostium.  Dominant RCA with 60% ostial proximal narrowing in an RV marginal branch and collateralization to the distal circumflex.   LVEDP 14 mm Hg.   RECOMMENDATION: Surgical consultation for CABG revascularization. __________  2D echo 09/08/2017: - Left ventricle: Inferobasal and posterior lateral hypokinesis EF    similar to echo from 2016. The cavity size was normal. Wall    thickness was normal. Systolic function was mildly reduced. The    estimated ejection fraction was in the range of 45% to 50%.  - Mitral valve: Calcified annulus. Mildly thickened leaflets .    There was mild regurgitation.  - Left atrium: The atrium was moderately dilated.  - Pulmonary arteries: PA peak pressure: 50 mm Hg  (S). __________  2D echo 07/12/2015: - Left ventricle: The cavity size was normal. There was severe    concentric hypertrophy. Systolic function was mildly reduced. The    estimated ejection fraction was in the range of 45% to 50%. Mild    hypokinesis of the basal to mid anterior, anteroseptal and    inferoseptal walls. Doppler parameters are consistent with    abnormal left ventricular relaxation (grade 1 diastolic    dysfunction).  - Aortic valve: Transvalvular velocity was within the normal range.    There was no stenosis. There was no regurgitation.  - Mitral valve: There was trivial regurgitation.  - Left atrium: The atrium was mildly dilated.  - Right ventricle: The cavity size was normal. Wall thickness was    normal. Systolic function was normal.    EKG:  EKG is ordered today.  The EKG ordered today demonstrates NSR, 81 bpm, left anterior fascicular block, nonspecific ST-T changes  Recent Labs: 06/30/2021: TSH 1.428 07/12/2021: Magnesium 1.8 09/25/2021: ALT 49; B Natriuretic Peptide 1,494.1 09/26/2021: Hemoglobin 10.9; Platelets 180 09/27/2021: BUN 56; Creatinine, Ser 6.62; Sodium 134 09/28/2021: Potassium 4.9  Recent Lipid Panel    Component Value Date/Time   CHOL 122 09/26/2021 0613   TRIG 48 09/26/2021 0613   HDL 45 09/26/2021 0613   CHOLHDL 2.7 09/26/2021 0613   VLDL 10 09/26/2021 0613   LDLCALC 67 09/26/2021 0613    PHYSICAL EXAM:    VS:  BP 124/64    Pulse 81    Ht  5\' 9"  (1.753 m)    Wt 165 lb (74.8 kg)    SpO2 93%    BMI 24.37 kg/m   BMI: Body mass index is 24.37 kg/m.  Physical Exam Vitals reviewed.  Constitutional:      Appearance: He is well-developed.  HENT:     Head: Normocephalic and atraumatic.     Right Ear: Decreased hearing noted.     Left Ear: Decreased hearing noted.  Eyes:     General:        Right eye: No discharge.        Left eye: No discharge.  Neck:     Vascular: No JVD.  Cardiovascular:     Rate and Rhythm: Normal rate and  regular rhythm.     Pulses:          Posterior tibial pulses are 2+ on the right side and 2+ on the left side.     Heart sounds: Normal heart sounds, S1 normal and S2 normal. Heart sounds not distant. No midsystolic click and no opening snap. No murmur heard.   No friction rub.  Pulmonary:     Effort: Pulmonary effort is normal. No respiratory distress.     Breath sounds: Normal breath sounds. No decreased breath sounds, wheezing or rales.  Chest:     Chest wall: No tenderness.  Abdominal:     General: There is no distension.     Palpations: Abdomen is soft.     Tenderness: There is no abdominal tenderness.  Musculoskeletal:     Cervical back: Normal range of motion.     Right lower leg: No edema.     Left lower leg: No edema.     Comments: No swelling of the right AKA stump  Skin:    General: Skin is warm and dry.     Nails: There is no clubbing.  Neurological:     Mental Status: He is alert and oriented to person, place, and time.  Psychiatric:        Speech: Speech normal.        Behavior: Behavior normal.        Thought Content: Thought content normal.        Judgment: Judgment normal.    Wt Readings from Last 3 Encounters:  11/09/21 165 lb (74.8 kg)  10/26/21 165 lb (74.8 kg)  09/29/21 162 lb 11.2 oz (73.8 kg)     ASSESSMENT & PLAN:   CAD status post CABG without angina: He is without symptoms concerning for angina.  He remains on aspirin, atorvastatin, and amlodipine.  Aggressive risk factor modification is recommended.  No indication for ischemic testing at this time.  Chronic combined systolic and diastolic CHF: He appears euvolemic and well compensated.  Most recent echo from 06/2021 demonstrated a low normal LV systolic function.  Volume status is managed via hemodialysis.  Mitral valve disease: Status postsurgical repair at time of CABG. stable on echo in 06/2021.  Atrial flutter: Maintaining sinus rhythm.  Prior cardiology note from 11/2018 indicates the patient  was previously on Coumadin, though this was discontinued at some point prior to that visit secondary to patient noncompliance.  His CHA2DS2-VASc is 7 (CHF, HTN, age x1, DM, TIA x2, vascular disease), and therefore has a significant CVA risk.  We did discuss initiation of Coumadin (has history of mitral valve repair) today, however the worker present from his living facility raises concerns about the patient being able to come over for INR checks secondary  to his dialysis schedule.  I have asked that his facility reach out to [place] to see if they can check his PT/INR on a regular basis.  We would need to establish that we can regularly and safely obtain his INR readings prior to initiating anticoagulation.  He is aware of stroke risk off anticoagulation.  We would also need to ensure initiation of anticoagulation is safe from a neurologic perspective given the patient's history of seizure disorder.  ESRD on HD: Per nephrology.  History of CVA and seizure disorder: Followed by neurology.  No new deficits.  Strained speech is at baseline per healthcare worker present with him.  HTN: Blood pressure is controlled in the office today.  Continue current therapy.  HLD: LDL 67 in 09/2021.  He remains on atorvastatin.  PAD: Status post right AKA.  He remains on aspirin and atorvastatin.  Follow-up with vascular surgery as directed.    Disposition: F/u with MD only to reestablish care in 1 month.   Medication Adjustments/Labs and Tests Ordered: Current medicines are reviewed at length with the patient today.  Concerns regarding medicines are outlined above. Medication changes, Labs and Tests ordered today are summarized above and listed in the Patient Instructions accessible in Encounters.   Signed, Christell Faith, [name] 11/09/2021 9:59 AM     [address], [place] [address] [phone]

## 2021-11-09 ENCOUNTER — Other Ambulatory Visit: Payer: Self-pay

## 2021-11-09 ENCOUNTER — Encounter: Payer: Self-pay | Admitting: Physician Assistant

## 2021-11-09 ENCOUNTER — Ambulatory Visit (INDEPENDENT_AMBULATORY_CARE_PROVIDER_SITE_OTHER): Payer: Medicare (Managed Care) | Admitting: Physician Assistant

## 2021-11-09 VITALS — BP 124/64 | HR 81 | Ht 69.0 in | Wt 165.0 lb

## 2021-11-09 DIAGNOSIS — I2510 Atherosclerotic heart disease of native coronary artery without angina pectoris: Secondary | ICD-10-CM

## 2021-11-09 DIAGNOSIS — E785 Hyperlipidemia, unspecified: Secondary | ICD-10-CM

## 2021-11-09 DIAGNOSIS — G40909 Epilepsy, unspecified, not intractable, without status epilepticus: Secondary | ICD-10-CM

## 2021-11-09 DIAGNOSIS — I739 Peripheral vascular disease, unspecified: Secondary | ICD-10-CM

## 2021-11-09 DIAGNOSIS — I5042 Chronic combined systolic (congestive) and diastolic (congestive) heart failure: Secondary | ICD-10-CM | POA: Diagnosis not present

## 2021-11-09 DIAGNOSIS — Z8673 Personal history of transient ischemic attack (TIA), and cerebral infarction without residual deficits: Secondary | ICD-10-CM

## 2021-11-09 DIAGNOSIS — Z951 Presence of aortocoronary bypass graft: Secondary | ICD-10-CM | POA: Diagnosis not present

## 2021-11-09 DIAGNOSIS — I10 Essential (primary) hypertension: Secondary | ICD-10-CM

## 2021-11-09 DIAGNOSIS — I4892 Unspecified atrial flutter: Secondary | ICD-10-CM

## 2021-11-09 DIAGNOSIS — N186 End stage renal disease: Secondary | ICD-10-CM

## 2021-11-09 NOTE — Patient Instructions (Signed)
Medication Instructions:  No changes at this time.  *If you need a refill on your cardiac medications before your next appointment, please call your pharmacy*   Lab Work: None  If you have labs (blood work) drawn today and your tests are completely normal, you will receive your results only by: [place] (if you have MyChart) OR A paper copy in the mail If you have any lab test that is abnormal or we need to change your treatment, we will call you to review the results.   Testing/Procedures: None   Follow-Up: At [facility], you and your health needs are our priority.  As part of our continuing mission to provide you with exceptional heart care, we have created designated Provider Care Teams.  These Care Teams include your primary Cardiologist (physician) and Advanced Practice Providers (APPs -  Physician Assistants and Nurse Practitioners) who all work together to provide you with the care you need, when you need it.   Your next appointment:   1 month(s) MD ONLY  The format for your next appointment:   In Person  Provider:   Nelva Bush, MD

## 2021-11-13 ENCOUNTER — Encounter (INDEPENDENT_AMBULATORY_CARE_PROVIDER_SITE_OTHER): Payer: Self-pay | Admitting: Nurse Practitioner

## 2021-11-13 ENCOUNTER — Ambulatory Visit (INDEPENDENT_AMBULATORY_CARE_PROVIDER_SITE_OTHER): Payer: Medicare (Managed Care) | Admitting: Nurse Practitioner

## 2021-11-13 ENCOUNTER — Ambulatory Visit (INDEPENDENT_AMBULATORY_CARE_PROVIDER_SITE_OTHER): Payer: Medicare (Managed Care)

## 2021-11-13 ENCOUNTER — Other Ambulatory Visit: Payer: Self-pay

## 2021-11-13 ENCOUNTER — Other Ambulatory Visit (INDEPENDENT_AMBULATORY_CARE_PROVIDER_SITE_OTHER): Payer: Self-pay | Admitting: Nurse Practitioner

## 2021-11-13 VITALS — BP 108/76 | HR 72 | Resp 16

## 2021-11-13 DIAGNOSIS — I739 Peripheral vascular disease, unspecified: Secondary | ICD-10-CM

## 2021-11-13 DIAGNOSIS — E1165 Type 2 diabetes mellitus with hyperglycemia: Secondary | ICD-10-CM

## 2021-11-13 DIAGNOSIS — E1122 Type 2 diabetes mellitus with diabetic chronic kidney disease: Secondary | ICD-10-CM

## 2021-11-13 DIAGNOSIS — I120 Hypertensive chronic kidney disease with stage 5 chronic kidney disease or end stage renal disease: Secondary | ICD-10-CM | POA: Diagnosis not present

## 2021-11-13 DIAGNOSIS — Z992 Dependence on renal dialysis: Secondary | ICD-10-CM

## 2021-11-13 DIAGNOSIS — N186 End stage renal disease: Secondary | ICD-10-CM

## 2021-11-13 NOTE — H&P (View-Only) (Signed)
Subjective:    Patient ID: Ernest [name], male    DOB: 1946-01-09, 76 y.o.   MRN: [id number] Chief Complaint  Patient presents with   New Patient (Initial Visit)    Ref ABI with consult    The patient is seen for evaluation of painful lower extremities and diminished pulses associated with ulceration of the foot.  The patient notes the ulcer has been present for multiple weeks and has not been improving.  It is very painful and has had some drainage.  No specific history of trauma noted by the patient.  The patient denies fever or chills.  the patient does have diabetes which has been difficult to control.  The patient was previously scheduled for angiogram however.  He has a previous stenting on the right below-knee amputation.  There is a wound was also seen as an ulceration on the toes of his left lower extremity.     The patient denies rest pain or dangling of an extremity off the side of the bed during the night for relief. No prior interventions or surgeries.  No history of back problems or DJD of the lumbar sacral spine.   The patient denies amaurosis fugax or recent TIA symptoms. There are no recent neurological changes noted. The patient denies history of DVT, PE or superficial thrombophlebitis. The patient denies recent episodes of angina or shortness of breath.    Today the patient has a left ABI of 1.27 with a TBI of 0.65.  He has monophasic/biphasic waveforms with decreased toe waveforms.  Previous ABIs are 1.17 on the left with a TBI 0.48   Review of Systems  Skin:  Positive for wound.  Neurological:  Positive for weakness.  All other systems reviewed and are negative.     Objective:   Physical Exam Vitals reviewed.  HENT:     Head: Normocephalic.  Cardiovascular:     Rate and Rhythm: Normal rate.  Pulmonary:     Effort: Pulmonary effort is normal.  Skin:    General: Skin is warm and dry.  Neurological:     Mental Status: He is alert and oriented to person,  place, and time.  Psychiatric:        Mood and Affect: Mood normal.        Behavior: Behavior normal.        Thought Content: Thought content normal.        Judgment: Judgment normal.    BP 108/76 (BP Location: Right Arm)    Pulse 72    Resp 16   Past Medical History:  Diagnosis Date   Abnormal stress test    s/p cath November 2013 with modest disease involving the ostial left main, proximal LAD, proximal RCA - do not appear to be hemodynamically signficant; mild LV dysfunction   Anemia    low iron   Anxiety    Arthritis    Bacteremia    Chronic systolic CHF (congestive heart failure) (HCC)    COPD (chronic obstructive pulmonary disease) ([place])    Coronary artery disease    a. per cath report 2013, b. 09/13/2017-CABG X4 & Mitral valve repair   Depression    DVT (deep venous thrombosis) (HCC)    arm - right- finished blood thinner- not sure when it   Ejection fraction < 50%    Erectile dysfunction    penile implant   ESRD (end stage renal disease) ([place])    [facility] on a MWF schedule.  Gout    Hx: of   Hepatitis C    History of seizures    last one 2018   Hyperlipidemia    Hypertension    Mitral valve disease    s/p Mitral repair 09/13/17   Obesity    Retinopathy    Shortness of breath    Hx: of with exertion   Stroke [facility])    "several" left side weakness, speech affected   Type II or unspecified type diabetes mellitus without mention of complication, not stated as uncontrolled    adult onset    Social History   Socioeconomic History   Marital status: Widowed    Spouse name: Not on file   Number of children: Not on file   Years of education: Not on file   Highest education level: Not on file  Occupational History   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  Vaping Use   Vaping Use: Never used  Substance and Sexual Activity   Alcohol use: No    Alcohol/week: 0.0 standard drinks   Drug use: No   Sexual activity: Not on file  Other Topics Concern   Not on file  Social History Narrative   Adopted mentally retarded child at home, 2 adopted children, 3 living and 2 deceased.   Social Determinants of Health   Financial Resource Strain: Not on file  Food Insecurity: Not on file  Transportation Needs: Not on file  Physical Activity: Not on file  Stress: Not on file  Social Connections: Not on file  Intimate Partner Violence: Not on file    Past Surgical History:  Procedure Laterality Date   A/V FISTULAGRAM N/A 10/04/2017   Procedure: A/V FISTULAGRAM;  Surgeon: Ernest Dutch, MD;  Location: [place] [facility];  Service: Cardiovascular;  Laterality: N/A;   A/V SHUNTOGRAM Left 07/12/2020   Procedure: A/V SHUNTOGRAM;  Surgeon: Ernest Mitchell, MD;  Location: [place] [facility];  Service: Cardiovascular;  Laterality: Left;   AMPUTATION Right 07/08/2021   Procedure: AMPUTATION ABOVE KNEE;  Surgeon: Ernest Guise, MD;  Location: [facility];  Service: Vascular;  Laterality: Right;   ANGIOPLASTY Left 09/24/2019   Procedure: Angioplasty Innominate Vien;  Surgeon: Ernest Mitchell, MD;  Location: [place];  Service: Vascular;  Laterality: Left;   AV FISTULA PLACEMENT Left 01/13/2013   Procedure: INSERTION OF ARTERIOVENOUS (AV) GORE-TEX GRAFT ARM;  Surgeon: Ernest Mould, MD;  Location: [place];  Service: Vascular;  Laterality: Left;   AV FISTULA PLACEMENT Left 05/05/2013   Procedure: INSERTION OF LEFT UPPER ARM  ARTERIOVENOUS GORTEX GRAFT;  Surgeon: Ernest Mould, MD;  Location: [place];  Service: Vascular;  Laterality: Left;   AV FISTULA PLACEMENT Left 11/26/2019   Procedure: INSERTION OF ARTERIOVENOUS (AV) GORE-TEX GRAFT, left  ARM;  Surgeon: Ernest Mitchell, MD;  Location: [place];  Service: Vascular;   Laterality: Left;   [place] REMOVAL Left 03/26/2013   Procedure: REMOVAL OF  NON INCORPORATED ARTERIOVENOUS GORETEX GRAFT ([place]) left arm * repair of  left brachial artery with vein patch angioplasty.;  Surgeon: Ernest Mould, MD;  Location: [place];  Service: Vascular;  Laterality: Left;   CARDIAC  CATHETERIZATION  August 26, 2012   CATARACT EXTRACTION W/ INTRAOCULAR LENS  IMPLANT, BILATERAL     COLONOSCOPY     CORONARY ARTERY BYPASS GRAFT N/A 09/13/2017   Procedure: CORONARY ARTERY BYPASS GRAFTING (CABG) times four LIMA  to LAD, SVG sequentially to OM and RAMUS Intermediate and SVG to Acute Marginal;  Surgeon: Melrose Nakayama, MD;  Location: [place];  Service: Open Heart Surgery;  Laterality: N/A;   DIALYSIS/PERMA CATHETER INSERTION N/A 02/28/2021   Procedure: DIALYSIS/PERMA CATHETER EXCHANGE;  Surgeon: Katha Cabal, MD;  Location: [place] [facility];  Service: Cardiovascular;  Laterality: N/A;   EMBOLECTOMY Right 08/27/2013   Procedure: EMBOLECTOMY;  Surgeon: Mal Misty, MD;  Location: [place];  Service: Vascular;  Laterality: Right;  Thrombectomy of Radial and ulnar artery.   ENDOVASCULAR STENT INSERTION Left 09/24/2019   Procedure: Endovascular Stent Graft Insertion, Arm Graft;  Surgeon: Ernest Mitchell, MD;  Location: [facility] OR;  Service: Vascular;  Laterality: Left;   EYE SURGERY     laser.  and surgery for DM   fistula     RUE and wrist   FISTULOGRAM Left 09/24/2019   Procedure: SHUNTOGRAM OF ARTERIOVENOUS FISTULA LEFT ARM,;  Surgeon: Ernest Mitchell, MD;  Location: [place];  Service: Vascular;  Laterality: Left;   INSERTION OF DIALYSIS CATHETER Right 01/01/2013   Procedure: INSERTION OF DIALYSIS CATHETER right  internal jugular;  Surgeon: Ernest Mitchell, MD;  Location: [place];  Service: Vascular;  Laterality: Right;   INSERTION OF DIALYSIS CATHETER N/A 03/26/2013   Procedure: INSERTION OF DIALYSIS CATHETER Left internal jugular vein;  Surgeon: Ernest Mould, MD;   Location: [place];  Service: Vascular;  Laterality: N/A;   INSERTION OF DIALYSIS CATHETER Right 11/26/2019   Procedure: INSERTION OF DIALYSIS CATHETER, right internal jugular;  Surgeon: Ernest Mitchell, MD;  Location: [place];  Service: Vascular;  Laterality: Right;   IR THORACENTESIS ASP PLEURAL SPACE W/IMG GUIDE  09/30/2019   LEFT HEART CATH AND CORONARY ANGIOGRAPHY N/A 09/09/2017   Procedure: LEFT HEART CATH AND CORONARY ANGIOGRAPHY;  Surgeon: Troy Sine, MD;  Location: [place] [facility];  Service: Cardiovascular;  Laterality: N/A;   LIGATION OF ARTERIOVENOUS  FISTULA Right 12/30/2013   Procedure: REMOVAL OF SEGMENT OF GORTEX GRAFT AND FISTULA  AND REPAIR OF BRACHIAL ARTERY;  Surgeon: Mal Misty, MD;  Location: [place];  Service: Vascular;  Laterality: Right;   LOWER EXTREMITY ANGIOGRAPHY N/A 06/28/2021   Procedure: Lower Extremity Angiography;  Surgeon: Katha Cabal, MD;  Location: [place] [facility];  Service: Cardiovascular;  Laterality: N/A;   MITRAL VALVE REPAIR N/A 09/13/2017   Procedure: MITRAL VALVE REPAIR (MVR);  Surgeon: Melrose Nakayama, MD;  Location: [place];  Service: Open Heart Surgery;  Laterality: N/A;   MITRAL VALVE REPAIR (MV)/CORONARY ARTERY BYPASS GRAFTING (CABG)  09/13/2017   PENILE PROSTHESIS IMPLANT     1997.. no card   PERIPHERAL VASCULAR BALLOON ANGIOPLASTY Left 10/04/2017   Procedure: PERIPHERAL VASCULAR BALLOON ANGIOPLASTY;  Surgeon: Ernest Dutch, MD;  Location: [place] [facility];  Service: Cardiovascular;  Laterality: Left;  AVF   PERIPHERAL VASCULAR BALLOON ANGIOPLASTY Left 07/12/2020   Procedure: PERIPHERAL VASCULAR BALLOON ANGIOPLASTY;  Surgeon: Ernest Mitchell, MD;  Location: [place] [facility];  Service: Cardiovascular;  Laterality: Left;  ARM SHUNTOGRAM   REVISION OF ARTERIOVENOUS GORETEX GRAFT Left 09/24/2019   Procedure: REVISION OF ARTERIOVENOUS GORETEX GRAFT LEFT ARM;  Surgeon: Ernest Mitchell,  MD;  Location: [place];  Service: Vascular;   Laterality: Left;   TEE WITHOUT CARDIOVERSION N/A 06/11/2013   Procedure: TRANSESOPHAGEAL ECHOCARDIOGRAM (TEE);  Surgeon: Larey Dresser, MD;  Location[facility];  Service: Cardiovascular;  Laterality: N/A;   TEE WITHOUT CARDIOVERSION N/A 09/13/2017   Procedure: TRANSESOPHAGEAL ECHOCARDIOGRAM (TEE);  Surgeon: Melrose Nakayama, MD;  Location: [place];  Service: Open Heart Surgery;  Laterality: N/A;   THROMBECTOMY W/ EMBOLECTOMY Left 03/26/2013   Procedure: Attempted thrombectomy of left arm arteriovenous goretex graft.;  Surgeon: Ernest Mould, MD;  Location: [facility] OR;  Service: Vascular;  Laterality: Left;   ULTRASOUND GUIDANCE FOR VASCULAR ACCESS Bilateral 09/24/2019   Procedure: Ultrasound Guidance For Vascular Access, Right Femoral Vein and Left Arm Graft;  Surgeon: Ernest Mitchell, MD;  Location: [facility] OR;  Service: Vascular;  Laterality: Bilateral;   VENOGRAM N/A 04/07/2013   Procedure: VENOGRAM;  Surgeon: Ernest Mitchell, MD;  Location: [facility] CATH LAB;  Service: Cardiovascular;  Laterality: N/A;    Family History  Problem Relation Age of Onset   Heart disease Father    Diabetes Sister    Diabetes Brother    Diabetes Son     [name]  CBC Latest Ref Rng & Units 09/26/2021 09/25/2021 07/18/2021  WBC 4.0 - 10.5 K/uL 6.4 7.9 7.6  Hemoglobin 13.0 - 17.0 g/dL 10.9(L) 11.5(L) 9.0(L)  Hematocrit 39.0 - 52.0 % 33.1(L) 34.6(L) 28.5(L)  Platelets 150 - 400 K/uL 180 186 332      CMP     Component Value Date/Time   NA 134 (L) 09/27/2021 0657   NA 140 12/11/2017 0000   K 4.9 09/28/2021 0857   CL 97 (L) 09/27/2021 0657   CO2 26 09/27/2021 0657   GLUCOSE 98 09/27/2021 0657   BUN 56 (H) 09/27/2021 0657   BUN 16 12/11/2017 0000   CREATININE 6.62 (H) 09/27/2021 0657   CALCIUM 8.0 (L) 09/27/2021 0657   CALCIUM 8.4 (L) 10/05/2017 0615   PROT 7.0 09/25/2021 1216   ALBUMIN 3.2 (L) 09/25/2021 1216   AST 50 (H) 09/25/2021 1216   ALT 49 (H) 09/25/2021 1216   ALKPHOS 84  09/25/2021 1216   BILITOT 0.7 09/25/2021 1216   GFRNONAA 8 (L) 09/27/2021 0657   GFRAA 21 (L) 12/04/2019 2027     No results found.     Assessment & Plan:   1. PAD (peripheral artery disease) (HCC)  Recommend:  The patient has evidence of severe atherosclerotic changes of both lower extremities associated with ulceration and tissue loss of the foot.  This represents a limb threatening ischemia and places the patient at the risk for limb loss.  Patient should undergo angiography of the lower extremities with the hope for intervention for limb salvage.  The risks and benefits as well as the alternative therapies was discussed in detail with the patient.  All questions were answered.  Patient agrees to proceed with angiography.  The patient will follow up with me in the office after the procedure.    2. Type 2 diabetes mellitus with hyperglycemia, without long-term current use of insulin (HCC) Continue hypoglycemic medications as already ordered, these medications have been reviewed and there are no changes at this time.  Hgb A1C to be monitored as already arranged by primary service   3. Hypertension due to end stage renal disease caused by type 2 diabetes mellitus, on dialysis [facility]) Continue antihypertensive medications as already ordered, these medications have been reviewed and there are  no changes at this time.    Current Outpatient Medications on File Prior to Visit  Medication Sig Dispense Refill   acetaminophen (TYLENOL) 500 MG tablet Take 1,000 mg by mouth every 6 (six) hours as needed for mild pain.     Amino Acids-Protein Hydrolys (FEEDING SUPPLEMENT, PRO-STAT 64,) LIQD Take 30 mLs by mouth 3 (three) times daily with meals.     amLODipine (NORVASC) 5 MG tablet Take 1 tablet (5 mg total) by mouth daily. 30 tablet 3   aspirin EC 81 MG tablet Take 1 tablet (81 mg total) by mouth daily. 30 tablet 11   atorvastatin (LIPITOR) 40 MG tablet Take 40 mg by mouth at bedtime.      levETIRAcetam (KEPPRA) 500 MG tablet Take 1 tablet (500 mg total) by mouth 3 (three) times daily. On MWF - administer lunchtime dose once returns from dialysis 36 tablet 5   multivitamin (RENA-VIT) TABS tablet Take 1 tablet by mouth at bedtime. 30 tablet 0   Nutritional Supplements (FEEDING SUPPLEMENT, NEPRO CARB STEADY,) LIQD Take 237 mLs by mouth 2 (two) times daily between meals.  0   pantoprazole (PROTONIX) 40 MG tablet Take 1 tablet (40 mg total) by mouth daily. 30 tablet 0   polyethylene glycol (MIRALAX / GLYCOLAX) 17 g packet Take 17 g by mouth daily. Dissolve in 4-8 ounces of water     senna-docusate (SENOKOT-S) 8.6-50 MG tablet Take 1 tablet by mouth at bedtime.     sertraline (ZOLOFT) 25 MG tablet Take 25 mg by mouth at bedtime.     sevelamer carbonate (RENVELA) 800 MG tablet Take 800 mg by mouth 3 (three) times daily with meals. On Monday, Wednesday, and Friday. Give NOON dose on MWF asap when returning from dialysis     sevelamer carbonate (RENVELA) 800 MG tablet Take 800 mg by mouth 3 (three) times daily with meals. On Tuesday, Thursday, Saturday, and Sunday (non-dialysis days)     levETIRAcetam (KEPPRA) 500 MG tablet Take 1 tablet (500 mg total) by mouth 2 (two) times daily. Only on Tuesday, Thursday, Saturday, Sunday (Patient not taking: Reported on 11/09/2021) 32 tablet 5   No current facility-administered medications on file prior to visit.    There are no Patient Instructions on file for this visit. No follow-ups on file.   Kris Hartmann, NP

## 2021-11-13 NOTE — Progress Notes (Signed)
Subjective:    Patient ID: Ernest [name], male    DOB: June 18, 1946, 76 y.o.   MRN: [id number] Chief Complaint  Patient presents with   New Patient (Initial Visit)    Ref ABI with consult    The patient is seen for evaluation of painful lower extremities and diminished pulses associated with ulceration of the foot.  The patient notes the ulcer has been present for multiple weeks and has not been improving.  It is very painful and has had some drainage.  No specific history of trauma noted by the patient.  The patient denies fever or chills.  the patient does have diabetes which has been difficult to control.  The patient was previously scheduled for angiogram however.  He has a previous stenting on the right below-knee amputation.  There is a wound was also seen as an ulceration on the toes of his left lower extremity.     The patient denies rest pain or dangling of an extremity off the side of the bed during the night for relief. No prior interventions or surgeries.  No history of back problems or DJD of the lumbar sacral spine.   The patient denies amaurosis fugax or recent TIA symptoms. There are no recent neurological changes noted. The patient denies history of DVT, PE or superficial thrombophlebitis. The patient denies recent episodes of angina or shortness of breath.    Today the patient has a left ABI of 1.27 with a TBI of 0.65.  He has monophasic/biphasic waveforms with decreased toe waveforms.  Previous ABIs are 1.17 on the left with a TBI 0.48   Review of Systems  Skin:  Positive for wound.  Neurological:  Positive for weakness.  All other systems reviewed and are negative.     Objective:   Physical Exam Vitals reviewed.  HENT:     Head: Normocephalic.  Cardiovascular:     Rate and Rhythm: Normal rate.  Pulmonary:     Effort: Pulmonary effort is normal.  Skin:    General: Skin is warm and dry.  Neurological:     Mental Status: He is alert and oriented to person,  place, and time.  Psychiatric:        Mood and Affect: Mood normal.        Behavior: Behavior normal.        Thought Content: Thought content normal.        Judgment: Judgment normal.    BP 108/76 (BP Location: Right Arm)    Pulse 72    Resp 16   Past Medical History:  Diagnosis Date   Abnormal stress test    s/p cath November 2013 with modest disease involving the ostial left main, proximal LAD, proximal RCA - do not appear to be hemodynamically signficant; mild LV dysfunction   Anemia    low iron   Anxiety    Arthritis    Bacteremia    Chronic systolic CHF (congestive heart failure) (HCC)    COPD (chronic obstructive pulmonary disease) ([place])    Coronary artery disease    a. per cath report 2013, b. 09/13/2017-CABG X4 & Mitral valve repair   Depression    DVT (deep venous thrombosis) (HCC)    arm - right- finished blood thinner- not sure when it   Ejection fraction < 50%    Erectile dysfunction    penile implant   ESRD (end stage renal disease) ([place])    [facility] on a MWF schedule.  Gout    Hx: of   Hepatitis C    History of seizures    last one 2018   Hyperlipidemia    Hypertension    Mitral valve disease    s/p Mitral repair 09/13/17   Obesity    Retinopathy    Shortness of breath    Hx: of with exertion   Stroke [facility])    "several" left side weakness, speech affected   Type II or unspecified type diabetes mellitus without mention of complication, not stated as uncontrolled    adult onset    Social History   Socioeconomic History   Marital status: Widowed    Spouse name: Not on file   Number of children: Not on file   Years of education: Not on file   Highest education level: Not on file  Occupational History   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  Vaping Use   Vaping Use: Never used  Substance and Sexual Activity   Alcohol use: No    Alcohol/week: 0.0 standard drinks   Drug use: No   Sexual activity: Not on file  Other Topics Concern   Not on file  Social History Narrative   Adopted mentally retarded child at home, 2 adopted children, 3 living and 2 deceased.   Social Determinants of Health   Financial Resource Strain: Not on file  Food Insecurity: Not on file  Transportation Needs: Not on file  Physical Activity: Not on file  Stress: Not on file  Social Connections: Not on file  Intimate Partner Violence: Not on file    Past Surgical History:  Procedure Laterality Date   A/V FISTULAGRAM N/A 10/04/2017   Procedure: A/V FISTULAGRAM;  Surgeon: Elam Dutch, MD;  Location: [place] [facility];  Service: Cardiovascular;  Laterality: N/A;   A/V SHUNTOGRAM Left 07/12/2020   Procedure: A/V SHUNTOGRAM;  Surgeon: Serafina Mitchell, MD;  Location: [place] [facility];  Service: Cardiovascular;  Laterality: Left;   AMPUTATION Right 07/08/2021   Procedure: AMPUTATION ABOVE KNEE;  Surgeon: Elmore Guise, MD;  Location: [facility];  Service: Vascular;  Laterality: Right;   ANGIOPLASTY Left 09/24/2019   Procedure: Angioplasty Innominate Vien;  Surgeon: Serafina Mitchell, MD;  Location: [place];  Service: Vascular;  Laterality: Left;   AV FISTULA PLACEMENT Left 01/13/2013   Procedure: INSERTION OF ARTERIOVENOUS (AV) GORE-TEX GRAFT ARM;  Surgeon: Angelia Mould, MD;  Location: [place];  Service: Vascular;  Laterality: Left;   AV FISTULA PLACEMENT Left 05/05/2013   Procedure: INSERTION OF LEFT UPPER ARM  ARTERIOVENOUS GORTEX GRAFT;  Surgeon: Angelia Mould, MD;  Location: [place];  Service: Vascular;  Laterality: Left;   AV FISTULA PLACEMENT Left 11/26/2019   Procedure: INSERTION OF ARTERIOVENOUS (AV) GORE-TEX GRAFT, left  ARM;  Surgeon: Serafina Mitchell, MD;  Location: [place];  Service: Vascular;   Laterality: Left;   [place] REMOVAL Left 03/26/2013   Procedure: REMOVAL OF  NON INCORPORATED ARTERIOVENOUS GORETEX GRAFT ([place]) left arm * repair of  left brachial artery with vein patch angioplasty.;  Surgeon: Angelia Mould, MD;  Location: [place];  Service: Vascular;  Laterality: Left;   CARDIAC  CATHETERIZATION  August 26, 2012   CATARACT EXTRACTION W/ INTRAOCULAR LENS  IMPLANT, BILATERAL     COLONOSCOPY     CORONARY ARTERY BYPASS GRAFT N/A 09/13/2017   Procedure: CORONARY ARTERY BYPASS GRAFTING (CABG) times four LIMA  to LAD, SVG sequentially to OM and RAMUS Intermediate and SVG to Acute Marginal;  Surgeon: Melrose Nakayama, MD;  Location: [place];  Service: Open Heart Surgery;  Laterality: N/A;   DIALYSIS/PERMA CATHETER INSERTION N/A 02/28/2021   Procedure: DIALYSIS/PERMA CATHETER EXCHANGE;  Surgeon: Katha Cabal, MD;  Location: [place] [facility];  Service: Cardiovascular;  Laterality: N/A;   EMBOLECTOMY Right 08/27/2013   Procedure: EMBOLECTOMY;  Surgeon: Mal Misty, MD;  Location: [place];  Service: Vascular;  Laterality: Right;  Thrombectomy of Radial and ulnar artery.   ENDOVASCULAR STENT INSERTION Left 09/24/2019   Procedure: Endovascular Stent Graft Insertion, Arm Graft;  Surgeon: Serafina Mitchell, MD;  Location: [facility] OR;  Service: Vascular;  Laterality: Left;   EYE SURGERY     laser.  and surgery for DM   fistula     RUE and wrist   FISTULOGRAM Left 09/24/2019   Procedure: SHUNTOGRAM OF ARTERIOVENOUS FISTULA LEFT ARM,;  Surgeon: Serafina Mitchell, MD;  Location: [place];  Service: Vascular;  Laterality: Left;   INSERTION OF DIALYSIS CATHETER Right 01/01/2013   Procedure: INSERTION OF DIALYSIS CATHETER right  internal jugular;  Surgeon: Serafina Mitchell, MD;  Location: [place];  Service: Vascular;  Laterality: Right;   INSERTION OF DIALYSIS CATHETER N/A 03/26/2013   Procedure: INSERTION OF DIALYSIS CATHETER Left internal jugular vein;  Surgeon: Angelia Mould, MD;   Location: [place];  Service: Vascular;  Laterality: N/A;   INSERTION OF DIALYSIS CATHETER Right 11/26/2019   Procedure: INSERTION OF DIALYSIS CATHETER, right internal jugular;  Surgeon: Serafina Mitchell, MD;  Location: [place];  Service: Vascular;  Laterality: Right;   IR THORACENTESIS ASP PLEURAL SPACE W/IMG GUIDE  09/30/2019   LEFT HEART CATH AND CORONARY ANGIOGRAPHY N/A 09/09/2017   Procedure: LEFT HEART CATH AND CORONARY ANGIOGRAPHY;  Surgeon: Troy Sine, MD;  Location: [place] [facility];  Service: Cardiovascular;  Laterality: N/A;   LIGATION OF ARTERIOVENOUS  FISTULA Right 12/30/2013   Procedure: REMOVAL OF SEGMENT OF GORTEX GRAFT AND FISTULA  AND REPAIR OF BRACHIAL ARTERY;  Surgeon: Mal Misty, MD;  Location: [place];  Service: Vascular;  Laterality: Right;   LOWER EXTREMITY ANGIOGRAPHY N/A 06/28/2021   Procedure: Lower Extremity Angiography;  Surgeon: Katha Cabal, MD;  Location: [place] [facility];  Service: Cardiovascular;  Laterality: N/A;   MITRAL VALVE REPAIR N/A 09/13/2017   Procedure: MITRAL VALVE REPAIR (MVR);  Surgeon: Melrose Nakayama, MD;  Location: [place];  Service: Open Heart Surgery;  Laterality: N/A;   MITRAL VALVE REPAIR (MV)/CORONARY ARTERY BYPASS GRAFTING (CABG)  09/13/2017   PENILE PROSTHESIS IMPLANT     1997.. no card   PERIPHERAL VASCULAR BALLOON ANGIOPLASTY Left 10/04/2017   Procedure: PERIPHERAL VASCULAR BALLOON ANGIOPLASTY;  Surgeon: Elam Dutch, MD;  Location: [place] [facility];  Service: Cardiovascular;  Laterality: Left;  AVF   PERIPHERAL VASCULAR BALLOON ANGIOPLASTY Left 07/12/2020   Procedure: PERIPHERAL VASCULAR BALLOON ANGIOPLASTY;  Surgeon: Serafina Mitchell, MD;  Location: [place] [facility];  Service: Cardiovascular;  Laterality: Left;  ARM SHUNTOGRAM   REVISION OF ARTERIOVENOUS GORETEX GRAFT Left 09/24/2019   Procedure: REVISION OF ARTERIOVENOUS GORETEX GRAFT LEFT ARM;  Surgeon: Serafina Mitchell,  MD;  Location: [place];  Service: Vascular;   Laterality: Left;   TEE WITHOUT CARDIOVERSION N/A 06/11/2013   Procedure: TRANSESOPHAGEAL ECHOCARDIOGRAM (TEE);  Surgeon: Larey Dresser, MD;  Location: [facility]OSCOPY;  Service: Cardiovascular;  Laterality: N/A;   TEE WITHOUT CARDIOVERSION N/A 09/13/2017   Procedure: TRANSESOPHAGEAL ECHOCARDIOGRAM (TEE);  Surgeon: Melrose Nakayama, MD;  Location: [place];  Service: Open Heart Surgery;  Laterality: N/A;   THROMBECTOMY W/ EMBOLECTOMY Left 03/26/2013   Procedure: Attempted thrombectomy of left arm arteriovenous goretex graft.;  Surgeon: Angelia Mould, MD;  [facility] OR;  Service: Vascular;  Laterality: Left;   ULTRASOUND GUIDANCE FOR VASCULAR ACCESS Bilateral 09/24/2019   Procedure: Ultrasound Guidance For Vascular Access, Right Femoral Vein and Left Arm Graft;  Surgeon: Serafina Mitchell, MD;  Location: [facility] OR;  Service: Vascular;  Laterality: Bilateral;   VENOGRAM N/A 04/07/2013   Procedure: VENOGRAM;  Surgeon: Serafina Mitchell, MD;  Location: [facility];  Service: Cardiovascular;  Laterality: N/A;    Family History  Problem Relation Age of Onset   Heart disease Father    Diabetes Sister    Diabetes Brother    Diabetes Son     [name]  CBC Latest Ref Rng & Units 09/26/2021 09/25/2021 07/18/2021  WBC 4.0 - 10.5 K/uL 6.4 7.9 7.6  Hemoglobin 13.0 - 17.0 g/dL 10.9(L) 11.5(L) 9.0(L)  Hematocrit 39.0 - 52.0 % 33.1(L) 34.6(L) 28.5(L)  Platelets 150 - 400 K/uL 180 186 332      CMP     Component Value Date/Time   NA 134 (L) 09/27/2021 0657   NA 140 12/11/2017 0000   K 4.9 09/28/2021 0857   CL 97 (L) 09/27/2021 0657   CO2 26 09/27/2021 0657   GLUCOSE 98 09/27/2021 0657   BUN 56 (H) 09/27/2021 0657   BUN 16 12/11/2017 0000   CREATININE 6.62 (H) 09/27/2021 0657   CALCIUM 8.0 (L) 09/27/2021 0657   CALCIUM 8.4 (L) 10/05/2017 0615   PROT 7.0 09/25/2021 1216   ALBUMIN 3.2 (L) 09/25/2021 1216   AST 50 (H) 09/25/2021 1216   ALT 49 (H) 09/25/2021 1216   ALKPHOS 84  09/25/2021 1216   BILITOT 0.7 09/25/2021 1216   GFRNONAA 8 (L) 09/27/2021 0657   GFRAA 21 (L) 12/04/2019 2027     No results found.     Assessment & Plan:   1. PAD (peripheral artery disease) (HCC)  Recommend:  The patient has evidence of severe atherosclerotic changes of both lower extremities associated with ulceration and tissue loss of the foot.  This represents a limb threatening ischemia and places the patient at the risk for limb loss.  Patient should undergo angiography of the lower extremities with the hope for intervention for limb salvage.  The risks and benefits as well as the alternative therapies was discussed in detail with the patient.  All questions were answered.  Patient agrees to proceed with angiography.  The patient will follow up with me in the office after the procedure.    2. Type 2 diabetes mellitus with hyperglycemia, without long-term current use of insulin (HCC) Continue hypoglycemic medications as already ordered, these medications have been reviewed and there are no changes at this time.  Hgb A1C to be monitored as already arranged by primary service   3. Hypertension due to end stage renal disease caused by type 2 diabetes mellitus, on dialysis [facility]) Continue antihypertensive medications as already ordered, these medications have been reviewed and there are  no changes at this time.    Current Outpatient Medications on File Prior to Visit  Medication Sig Dispense Refill   acetaminophen (TYLENOL) 500 MG tablet Take 1,000 mg by mouth every 6 (six) hours as needed for mild pain.     Amino Acids-Protein Hydrolys (FEEDING SUPPLEMENT, PRO-STAT 64,) LIQD Take 30 mLs by mouth 3 (three) times daily with meals.     amLODipine (NORVASC) 5 MG tablet Take 1 tablet (5 mg total) by mouth daily. 30 tablet 3   aspirin EC 81 MG tablet Take 1 tablet (81 mg total) by mouth daily. 30 tablet 11   atorvastatin (LIPITOR) 40 MG tablet Take 40 mg by mouth at bedtime.      levETIRAcetam (KEPPRA) 500 MG tablet Take 1 tablet (500 mg total) by mouth 3 (three) times daily. On MWF - administer lunchtime dose once returns from dialysis 36 tablet 5   multivitamin (RENA-VIT) TABS tablet Take 1 tablet by mouth at bedtime. 30 tablet 0   Nutritional Supplements (FEEDING SUPPLEMENT, NEPRO CARB STEADY,) LIQD Take 237 mLs by mouth 2 (two) times daily between meals.  0   pantoprazole (PROTONIX) 40 MG tablet Take 1 tablet (40 mg total) by mouth daily. 30 tablet 0   polyethylene glycol (MIRALAX / GLYCOLAX) 17 g packet Take 17 g by mouth daily. Dissolve in 4-8 ounces of water     senna-docusate (SENOKOT-S) 8.6-50 MG tablet Take 1 tablet by mouth at bedtime.     sertraline (ZOLOFT) 25 MG tablet Take 25 mg by mouth at bedtime.     sevelamer carbonate (RENVELA) 800 MG tablet Take 800 mg by mouth 3 (three) times daily with meals. On Monday, Wednesday, and Friday. Give NOON dose on MWF asap when returning from dialysis     sevelamer carbonate (RENVELA) 800 MG tablet Take 800 mg by mouth 3 (three) times daily with meals. On Tuesday, Thursday, Saturday, and Sunday (non-dialysis days)     levETIRAcetam (KEPPRA) 500 MG tablet Take 1 tablet (500 mg total) by mouth 2 (two) times daily. Only on Tuesday, Thursday, Saturday, Sunday (Patient not taking: Reported on 11/09/2021) 32 tablet 5   No current facility-administered medications on file prior to visit.    There are no Patient Instructions on file for this visit. No follow-ups on file.   Kris Hartmann, NP

## 2021-11-14 ENCOUNTER — Telehealth (INDEPENDENT_AMBULATORY_CARE_PROVIDER_SITE_OTHER): Payer: Self-pay

## 2021-11-14 NOTE — Addendum Note (Signed)
Addended by: Anselm Pancoast on: 11/14/2021 10:05 AM   Modules accepted: Orders

## 2021-11-14 NOTE — Telephone Encounter (Signed)
Spoke with Ernest [name] at [facility] and the patient is scheduled with Dr. Lucky Cowboy for a LLE angio on 11/23/21 with a 8:45 am arrival time to the MM. Pre-procedure instructions were faxed to [facility] at [facility].

## 2021-11-23 ENCOUNTER — Ambulatory Visit
Admission: RE | Admit: 2021-11-23 | Discharge: 2021-11-23 | Disposition: A | Discharge status: Skilled Nursing Facility | Payer: Medicare (Managed Care) | Source: Ambulatory Visit | Attending: Vascular Surgery | Admitting: Vascular Surgery

## 2021-11-23 ENCOUNTER — Other Ambulatory Visit (INDEPENDENT_AMBULATORY_CARE_PROVIDER_SITE_OTHER): Payer: Self-pay | Admitting: Nurse Practitioner

## 2021-11-23 ENCOUNTER — Encounter
Admission: RE | Disposition: A | Discharge status: Skilled Nursing Facility | Payer: Self-pay | Source: Ambulatory Visit | Attending: Vascular Surgery

## 2021-11-23 ENCOUNTER — Other Ambulatory Visit: Payer: Self-pay

## 2021-11-23 ENCOUNTER — Encounter: Payer: Self-pay | Admitting: Vascular Surgery

## 2021-11-23 DIAGNOSIS — I70261 Atherosclerosis of native arteries of extremities with gangrene, right leg: Secondary | ICD-10-CM

## 2021-11-23 DIAGNOSIS — E11621 Type 2 diabetes mellitus with foot ulcer: Secondary | ICD-10-CM | POA: Insufficient documentation

## 2021-11-23 DIAGNOSIS — I5022 Chronic systolic (congestive) heart failure: Secondary | ICD-10-CM | POA: Insufficient documentation

## 2021-11-23 DIAGNOSIS — N186 End stage renal disease: Secondary | ICD-10-CM | POA: Diagnosis not present

## 2021-11-23 DIAGNOSIS — I70249 Atherosclerosis of native arteries of left leg with ulceration of unspecified site: Secondary | ICD-10-CM

## 2021-11-23 DIAGNOSIS — E1122 Type 2 diabetes mellitus with diabetic chronic kidney disease: Secondary | ICD-10-CM | POA: Diagnosis not present

## 2021-11-23 DIAGNOSIS — E1152 Type 2 diabetes mellitus with diabetic peripheral angiopathy with gangrene: Secondary | ICD-10-CM | POA: Diagnosis present

## 2021-11-23 DIAGNOSIS — I70262 Atherosclerosis of native arteries of extremities with gangrene, left leg: Secondary | ICD-10-CM | POA: Diagnosis not present

## 2021-11-23 DIAGNOSIS — Z87891 Personal history of nicotine dependence: Secondary | ICD-10-CM | POA: Insufficient documentation

## 2021-11-23 DIAGNOSIS — I132 Hypertensive heart and chronic kidney disease with heart failure and with stage 5 chronic kidney disease, or end stage renal disease: Secondary | ICD-10-CM | POA: Insufficient documentation

## 2021-11-23 DIAGNOSIS — Z89511 Acquired absence of right leg below knee: Secondary | ICD-10-CM | POA: Insufficient documentation

## 2021-11-23 DIAGNOSIS — I2510 Atherosclerotic heart disease of native coronary artery without angina pectoris: Secondary | ICD-10-CM

## 2021-11-23 DIAGNOSIS — L97529 Non-pressure chronic ulcer of other part of left foot with unspecified severity: Secondary | ICD-10-CM | POA: Insufficient documentation

## 2021-11-23 DIAGNOSIS — I70269 Atherosclerosis of native arteries of extremities with gangrene, unspecified extremity: Secondary | ICD-10-CM

## 2021-11-23 HISTORY — PX: LOWER EXTREMITY ANGIOGRAPHY: CATH118251

## 2021-11-23 LAB — GLUCOSE, CAPILLARY
Glucose-Capillary: 170 mg/dL — ABNORMAL HIGH (ref 70–99)
Glucose-Capillary: 196 mg/dL — ABNORMAL HIGH (ref 70–99)

## 2021-11-23 LAB — POTASSIUM (ARMC VASCULAR LAB ONLY): Potassium (ARMC vascular lab): 3.4 — ABNORMAL LOW (ref 3.5–5.1)

## 2021-11-23 SURGERY — LOWER EXTREMITY ANGIOGRAPHY
Anesthesia: Moderate Sedation | Site: Leg Lower | Laterality: Left

## 2021-11-23 MED ORDER — HEPARIN SODIUM (PORCINE) 1000 UNIT/ML IJ SOLN
INTRAMUSCULAR | Status: AC
  Filled 2021-11-23: qty 10

## 2021-11-23 MED ORDER — CEFAZOLIN SODIUM-DEXTROSE 2-4 GM/100ML-% IV SOLN
INTRAVENOUS | Status: AC
  Filled 2021-11-23: qty 100

## 2021-11-23 MED ORDER — FENTANYL CITRATE PF 50 MCG/ML IJ SOSY
PREFILLED_SYRINGE | INTRAMUSCULAR | Status: AC
  Filled 2021-11-23: qty 1

## 2021-11-23 MED ORDER — LABETALOL HCL 5 MG/ML IV SOLN: 10.0000 mg | INTRAVENOUS | Status: DC | PRN

## 2021-11-23 MED ORDER — CLOPIDOGREL BISULFATE 75 MG PO TABS: 75.0000 mg | ORAL_TABLET | Freq: Every day | ORAL | 11 refills | Status: DC

## 2021-11-23 MED ORDER — SODIUM CHLORIDE 0.9% FLUSH: 3.0000 mL | Freq: Two times a day (BID) | INTRAVENOUS | Status: DC

## 2021-11-23 MED ORDER — HYDROMORPHONE HCL 1 MG/ML IJ SOLN: 1.0000 mg | Freq: Once | INTRAMUSCULAR | Status: DC | PRN

## 2021-11-23 MED ORDER — MIDAZOLAM HCL 2 MG/2ML IJ SOLN
INTRAMUSCULAR | Status: AC
  Filled 2021-11-23: qty 2

## 2021-11-23 MED ORDER — SODIUM CHLORIDE 0.9% FLUSH: 3.0000 mL | INTRAVENOUS | Status: DC | PRN

## 2021-11-23 MED ORDER — DIPHENHYDRAMINE HCL 50 MG/ML IJ SOLN: 50.0000 mg | Freq: Once | INTRAMUSCULAR | Status: DC | PRN

## 2021-11-23 MED ORDER — MIDAZOLAM HCL 2 MG/2ML IJ SOLN
INTRAMUSCULAR | Status: DC | PRN
  Administered 2021-11-23: 1 mg via INTRAVENOUS

## 2021-11-23 MED ORDER — FENTANYL CITRATE (PF) 100 MCG/2ML IJ SOLN
INTRAMUSCULAR | Status: DC | PRN
  Administered 2021-11-23: 25 ug via INTRAVENOUS

## 2021-11-23 MED ORDER — SODIUM CHLORIDE 0.9 % IV SOLN: INTRAVENOUS | Status: DC

## 2021-11-23 MED ORDER — ONDANSETRON HCL 4 MG/2ML IJ SOLN: 4.0000 mg | Freq: Four times a day (QID) | INTRAMUSCULAR | Status: DC | PRN

## 2021-11-23 MED ORDER — METHYLPREDNISOLONE SODIUM SUCC 125 MG IJ SOLR: 125.0000 mg | Freq: Once | INTRAMUSCULAR | Status: DC | PRN

## 2021-11-23 MED ORDER — ACETAMINOPHEN 325 MG PO TABS: 650.0000 mg | ORAL_TABLET | ORAL | Status: DC | PRN

## 2021-11-23 MED ORDER — CEFAZOLIN SODIUM-DEXTROSE 1-4 GM/50ML-% IV SOLN: 1.0000 g | Freq: Once | INTRAVENOUS | Status: DC

## 2021-11-23 MED ORDER — CEFAZOLIN SODIUM-DEXTROSE 1-4 GM/50ML-% IV SOLN
INTRAVENOUS | Status: AC
  Filled 2021-11-23: qty 50

## 2021-11-23 MED ORDER — HYDRALAZINE HCL 20 MG/ML IJ SOLN: 5.0000 mg | INTRAMUSCULAR | Status: DC | PRN

## 2021-11-23 MED ORDER — HEPARIN SODIUM (PORCINE) 1000 UNIT/ML IJ SOLN
INTRAMUSCULAR | Status: DC | PRN
  Administered 2021-11-23: 4000 [IU] via INTRAVENOUS

## 2021-11-23 MED ORDER — CLOPIDOGREL BISULFATE 75 MG PO TABS: 75.0000 mg | ORAL_TABLET | Freq: Every day | ORAL | Status: DC

## 2021-11-23 MED ORDER — MIDAZOLAM HCL 2 MG/ML PO SYRP: 8.0000 mg | ORAL_SOLUTION | Freq: Once | ORAL | Status: DC | PRN

## 2021-11-23 MED ORDER — FAMOTIDINE 20 MG PO TABS: 40.0000 mg | ORAL_TABLET | Freq: Once | ORAL | Status: DC | PRN

## 2021-11-23 MED ORDER — SODIUM CHLORIDE 0.9 % IV SOLN: 250.0000 mL | INTRAVENOUS | Status: DC | PRN

## 2021-11-23 SURGICAL SUPPLY — 18 items
BALLN ULTRVRSE 2.5X220X150 (BALLOONS) ×2
BALLN ULTRVRSE 3X100X150 (BALLOONS) ×2
BALLOON ULTRVRSE 2.5X220X150 (BALLOONS) IMPLANT
BALLOON ULTRVRSE 3X100X150 (BALLOONS) IMPLANT
CANNULA 5F STIFF (CANNULA) ×2 IMPLANT
CATH ANGIO 5F PIGTAIL 65CM (CATHETERS) ×1 IMPLANT
CATH VERT 5FR 125CM (CATHETERS) ×1 IMPLANT
COVER PROBE U/S 5X48 (MISCELLANEOUS) ×1 IMPLANT
DEVICE STARCLOSE SE CLOSURE (Vascular Products) ×1 IMPLANT
GLIDEWIRE ADV .035X260CM (WIRE) ×1 IMPLANT
KIT ENCORE 26 ADVANTAGE (KITS) ×1 IMPLANT
PACK ANGIOGRAPHY (CUSTOM PROCEDURE TRAY) ×2 IMPLANT
SHEATH BRITE TIP 5FRX11 (SHEATH) ×1 IMPLANT
SHEATH RAABE 6FRX70 (SHEATH) ×1 IMPLANT
SYR MEDRAD MARK 7 150ML (SYRINGE) ×1 IMPLANT
TUBING CONTRAST HIGH PRESS 72 (TUBING) ×1 IMPLANT
WIRE G V18X300CM (WIRE) ×1 IMPLANT
WIRE GUIDERIGHT .035X150 (WIRE) ×1 IMPLANT

## 2021-11-23 NOTE — Interval H&P Note (Signed)
History and Physical Interval Note:  11/23/2021 8:42 AM  Ernest [name]  has presented today for surgery, with the diagnosis of LLE Angiography   ASO with gangrene   BARD Rep   cc: Judi Cong.  The various methods of treatment have been discussed with the patient and family. After consideration of risks, benefits and other options for treatment, the patient has consented to  Procedure(s): LOWER EXTREMITY ANGIOGRAPHY (Left) as a surgical intervention.  The patient's history has been reviewed, patient examined, no change in status, stable for surgery.  I have reviewed the patient's chart and labs.  Questions were answered to the patient's satisfaction.     [name]

## 2021-11-23 NOTE — Op Note (Signed)
[facility]  Percutaneous Study/Intervention Procedural Note   Date of Surgery: 11/23/2021  Surgeon(s):Doniel Maiello    Assistants:none  Pre-operative Diagnosis: PAD with ulceration left lower  Post-operative diagnosis:  Same  Procedure(s) Performed:             1.  Ultrasound guidance for vascular access right femoral artery             2.  Catheter placement into left SFA from right femoral approach             3.  Aortogram and selective left lower extremity angiogram             4.  Percutaneous transluminal angioplasty of left anterior tibial artery with 3 mm diameter by 10 cm length angioplasty balloon             5.  Percutaneous transluminal angioplasty of the left peroneal artery with 2.5 mm diameter by 22 cm length angioplasty balloon  6.  StarClose closure device right femoral artery  EBL: 5 cc  Contrast: 45 cc  Fluoro Time: 4.7 minutes  Moderate Conscious Sedation Time: approximately 34 minutes using 1 mg of Versed and 25 mcg of Fentanyl              Indications:  Patient is a 76 y.o.male with a nonhealing ulceration on the left foot. The patient has noninvasive study showing reduced flow distal. The patient is brought in for angiography for further evaluation and potential treatment.  Due to the limb threatening nature of the situation, angiogram was performed for attempted limb salvage. The patient is aware that if the procedure fails, amputation would be expected.  The patient also understands that even with successful revascularization, amputation may still be required due to the severity of the situation.  Risks and benefits are discussed and informed consent is obtained.   Procedure:  The patient was identified and appropriate procedural time out was performed.  The patient was then placed supine on the table and prepped and draped in the usual sterile fashion. Moderate conscious sedation was administered during a face to face encounter with the  patient throughout the procedure with my supervision of the RN administering medicines and monitoring the patient's vital signs, pulse oximetry, telemetry and mental status throughout from the start of the procedure until the patient was taken to the recovery room. Ultrasound was used to evaluate the right common femoral artery.  It was patent .  A digital ultrasound image was acquired.  A Seldinger needle was used to access the right common femoral artery under direct ultrasound guidance and a permanent image was performed.  A 0.035 J wire was advanced without resistance and a 5Fr sheath was placed.  Pigtail catheter was placed into the aorta and an AP aortogram was performed. This demonstrated normal renal arteries and normal aorta and iliac segments without significant stenosis. I then crossed the aortic bifurcation and advanced to the left femoral head. Selective left lower extremity angiogram was then performed. This demonstrated that the vessels were calcific and the circulation time was extremely slow requiring advancement of the catheter into the mid left SFA to opacify distally.  The common femoral artery, profunda femoris artery, and superficial femoral arteries were all widely patent.  The popliteal artery was also patent there was a typical tibial trifurcation.  There was severe tibial vessel disease in all 3 vessels.  The anterior tibial artery was the largest vessel and was patent until the distal  segment where there was a high-grade stenosis in the 80 to 90% range and then the vessel was continuous to the foot but occluded in the proximal foot with poor distal flow.  The peroneal artery had a high-grade stenosis in the proximal segment and moderate to high-grade stenosis in the mid segment of greater than 70% but did provide good collateral flow into the foot.  The posterior tibial artery was diffusely diseased and occluded above the ankle without flow into the foot. It was felt that it was in the  patient's best interest to proceed with intervention after these images to avoid a second procedure and a larger amount of contrast and fluoroscopy based off of the findings from the initial angiogram. The patient was systemically heparinized and a 6 French 70 cm sheath was then placed over the Terumo Advantage wire. I then used a Kumpe catheter and the advantage wire to navigate down into the anterior tibial artery where I exchanged for a V18 wire and cross the stenosis in the distal anterior tibial artery parking the wire in the proximal foot.  A 3 mm diameter by 10 cm length angioplasty balloon was then inflated in the distal anterior tibial artery up to 14 atm for 1 minute.  Completion imaging showed marked improvement with only about a 15 to 20% residual stenosis.  I then turned my attention to the peroneal artery.  The wire was pulled back and then redirected through the peroneal artery crossing the stenosis in both the proximal segment in the midsegment without difficulty.  A 2.5 mm diameter by 22 cm length angioplasty balloon was then inflated to 10 atm for 1 minute.  Completion imaging showed marked improvement with the worst stenosis in the air being in the 25 to 30% range but now this actually having the most brisk flow distally. I elected to terminate the procedure. The sheath was removed and StarClose closure device was deployed in the right femoral artery with excellent hemostatic result. The patient was taken to the recovery room in stable condition having tolerated the procedure well.  Findings:               Aortogram: Renal artery flow is sluggish but there did not appear to be obvious stenosis.  The aorta and iliac arteries were widely patent.             Left Lower Extremity: This demonstrated that the vessels were calcific and the circulation time was extremely slow requiring advancement of the catheter into the mid left SFA to opacify distally.  The common femoral artery, profunda femoris  artery, and superficial femoral arteries were all widely patent.  The popliteal artery was also patent there was a typical tibial trifurcation.  There was severe tibial vessel disease in all 3 vessels.  The anterior tibial artery was the largest vessel and was patent until the distal segment where there was a high-grade stenosis in the 80 to 90% range and then the vessel was continuous to the foot but occluded in the proximal foot with poor distal flow.  The peroneal artery had a high-grade stenosis in the proximal segment and moderate to high-grade stenosis in the mid segment of greater than 70% but did provide good collateral flow into the foot.  The posterior tibial artery was diffusely diseased and occluded above the ankle without flow into the foot.   Disposition: Patient was taken to the recovery room in stable condition having tolerated the procedure well.  Complications: None  [name] 11/23/2021 10:25 AM   This note was created with Dragon Medical transcription system. Any errors in dictation are purely unintentional.

## 2021-12-07 ENCOUNTER — Ambulatory Visit (INDEPENDENT_AMBULATORY_CARE_PROVIDER_SITE_OTHER): Payer: Medicare (Managed Care) | Admitting: Internal Medicine

## 2021-12-07 ENCOUNTER — Other Ambulatory Visit: Payer: Self-pay

## 2021-12-07 ENCOUNTER — Encounter: Payer: Self-pay | Admitting: Internal Medicine

## 2021-12-07 VITALS — BP 120/64 | HR 83 | Ht 68.0 in | Wt 160.0 lb

## 2021-12-07 DIAGNOSIS — I5022 Chronic systolic (congestive) heart failure: Secondary | ICD-10-CM | POA: Diagnosis not present

## 2021-12-07 DIAGNOSIS — I739 Peripheral vascular disease, unspecified: Secondary | ICD-10-CM

## 2021-12-07 DIAGNOSIS — Z992 Dependence on renal dialysis: Secondary | ICD-10-CM

## 2021-12-07 DIAGNOSIS — N186 End stage renal disease: Secondary | ICD-10-CM

## 2021-12-07 DIAGNOSIS — I120 Hypertensive chronic kidney disease with stage 5 chronic kidney disease or end stage renal disease: Secondary | ICD-10-CM | POA: Diagnosis not present

## 2021-12-07 DIAGNOSIS — I2510 Atherosclerotic heart disease of native coronary artery without angina pectoris: Secondary | ICD-10-CM | POA: Diagnosis not present

## 2021-12-07 DIAGNOSIS — E1122 Type 2 diabetes mellitus with diabetic chronic kidney disease: Secondary | ICD-10-CM

## 2021-12-07 DIAGNOSIS — I4892 Unspecified atrial flutter: Secondary | ICD-10-CM

## 2021-12-07 NOTE — Patient Instructions (Signed)
Medication Instructions:   Your physician recommends that you continue on your current medications as directed. Please refer to the Current Medication list given to you today.  *If you need a refill on your cardiac medications before your next appointment, please call your pharmacy*   Lab Work:  None ordered  Testing/Procedures:  None ordered   Follow-Up: At [facility], you and your health needs are our priority.  As part of our continuing mission to provide you with exceptional heart care, we have created designated Provider Care Teams.  These Care Teams include your primary Cardiologist (physician) and Advanced Practice Providers (APPs -  Physician Assistants and Nurse Practitioners) who all work together to provide you with the care you need, when you need it.  We recommend signing up for the patient portal called "MyChart".  Sign up information is provided on this After Visit Summary.  MyChart is used to connect with patients for Virtual Visits (Telemedicine).  Patients are able to view lab/test results, encounter notes, upcoming appointments, etc.  Non-urgent messages can be sent to your provider as well.   To learn more about what you can do with MyChart, go to [email or web address].    Your next appointment:    1) Follow up with [facility] next week for Coumadin new start  2) Follow up with Dr. Saunders Revel or APP 3 months  The format for your next appointment:   In Person  Provider:   You may see Dr. Harrell Gave End or one of the following Advanced Practice Providers on your designated Care Team:   Murray Hodgkins, NP Christell Faith, [name] Cadence Kathlen Mody, [place]

## 2021-12-07 NOTE — Progress Notes (Signed)
Follow-up Outpatient Visit Date: 12/07/2021  Primary Care Provider: Brayton Mars, MD [address] [place] [address]  Chief Complaint: Follow-up coronary artery disease, heart failure, and atrial flutter  HPI:  Ernest [name] is a 76 y.o. male with history of coronary artery disease status post CABG (LIMA-LAD, sequential SVG-ramus-OM, and SVG-acute marginal) and mitral valve repair Oletta Lamas Physio II 32 mm annuloplasty ring) in 08/2017, chronic HFrEF with recovered LVEF, persistent atrial flutter, NSVT, Aarohi Redditt-stage renal disease, stroke complicated by seizure, PAD complicated by gangrene and sepsis leading to right AKA (06/2021), hypertension, hyperlipidemia, type 2 diabetes mellitus, and COPD, who presents for follow-up of coronary artery disease, heart failure, and atrial flutter.  He was last seen in our office a month ago by Ernest Faith, PA in order to reestablish cardiology care.  At that time, he was doing well following hospitalization a month earlier for suspected TIA.  He was previously on warfarin though this was discontinued due to nonadherence.  It was unclear that his INR is could be routinely followed at his nursing home.  Today, Ernest [name] complains only of low back pain.  He denies chest pain, shortness of breath, palpitations, lightheadedness, and edema.  He has not developed any new focal neurologic deficits.  He has not fallen but notes that he is wheelchair-bound.  He is not participating in any physical therapy.  He remains compliant with dialysis.  On further questioning, Ernest [name] and his caregiver from his nursing home report that it would be feasible for him to come for regular INR checks in our office.  --------------------------------------------------------------------------------------------------  Past Medical History:  Diagnosis Date   Abnormal stress test    s/p cath November 2013 with modest disease involving the ostial left main, proximal LAD, proximal  RCA - do not appear to be hemodynamically signficant; mild LV dysfunction   Anemia    low iron   Anxiety    Arthritis    Bacteremia    Chronic systolic CHF (congestive heart failure) (HCC)    COPD (chronic obstructive pulmonary disease) ([place])    Coronary artery disease    a. per cath report 2013, b. 09/13/2017-CABG X4 & Mitral valve repair   Depression    DVT (deep venous thrombosis) (HCC)    arm - right- finished blood thinner- not sure when it   Ejection fraction < 50%    Erectile dysfunction    penile implant   ESRD (Kaianna Dolezal stage renal disease) ([place])    [facility] on a MWF schedule.      Gout    Hx: of   Hepatitis C    History of seizures    last one 2018   Hyperlipidemia    Hypertension    Mitral valve disease    s/p Mitral repair 09/13/17   Obesity    Retinopathy    Shortness of breath    Hx: of with exertion   Stroke [facility])    "several" left side weakness, speech affected   Type II or unspecified type diabetes mellitus without mention of complication, not stated as uncontrolled    adult onset   Past Surgical History:  Procedure Laterality Date   A/V FISTULAGRAM N/A 10/04/2017   Procedure: A/V FISTULAGRAM;  Surgeon: Elam Dutch, MD;  Location: [place] CV LAB;  Service: Cardiovascular;  Laterality: N/A;   A/V SHUNTOGRAM Left 07/12/2020   Procedure: A/V SHUNTOGRAM;  Surgeon: Serafina Mitchell, MD;  Location: [place] CV LAB;  Service: Cardiovascular;  Laterality:  Left;   AMPUTATION Right 07/08/2021   Procedure: AMPUTATION ABOVE KNEE;  Surgeon: Elmore Guise, MD;  Location: [facility] ORS;  Service: Vascular;  Laterality: Right;   ANGIOPLASTY Left 09/24/2019   Procedure: Angioplasty Innominate Vien;  Surgeon: Serafina Mitchell, MD;  Location: [place];  Service: Vascular;  Laterality: Left;   AV FISTULA PLACEMENT Left 01/13/2013   Procedure: INSERTION OF ARTERIOVENOUS (AV) GORE-TEX GRAFT ARM;  Surgeon: Angelia Mould, MD;  [place];  Service: Vascular;   Laterality: Left;   AV FISTULA PLACEMENT Left 05/05/2013   Procedure: INSERTION OF LEFT UPPER ARM  ARTERIOVENOUS GORTEX GRAFT;  Surgeon: Angelia Mould, MD;  Location: [place];  Service: Vascular;  Laterality: Left;   AV FISTULA PLACEMENT Left 11/26/2019   Procedure: INSERTION OF ARTERIOVENOUS (AV) GORE-TEX GRAFT, left  ARM;  Surgeon: Serafina Mitchell, MD;  Location: [place];  Service: Vascular;  Laterality: Left;   [place] REMOVAL Left 03/26/2013   Procedure: REMOVAL OF  NON INCORPORATED ARTERIOVENOUS GORETEX GRAFT ([place]) left arm * repair of  left brachial artery with vein patch angioplasty.;  Surgeon: Angelia Mould, MD;  Location: [place];  Service: Vascular;  Laterality: Left;   CARDIAC CATHETERIZATION  August 26, 2012   CATARACT EXTRACTION W/ INTRAOCULAR LENS  IMPLANT, BILATERAL     COLONOSCOPY     CORONARY ARTERY BYPASS GRAFT N/A 09/13/2017   Procedure: CORONARY ARTERY BYPASS GRAFTING (CABG) times four LIMA  to LAD, SVG sequentially to OM and RAMUS Intermediate and SVG to Acute Marginal;  Surgeon: [place] Nakayama, MD;  Location: [place];  Service: Open Heart Surgery;  Laterality: N/A;   DIALYSIS/PERMA CATHETER INSERTION N/A 02/28/2021   Procedure: DIALYSIS/PERMA CATHETER EXCHANGE;  Surgeon: Katha Cabal, MD;  Location: [place] CV LAB;  Service: Cardiovascular;  Laterality: N/A;   EMBOLECTOMY Right 08/27/2013   Procedure: EMBOLECTOMY;  Surgeon: Mal Misty, MD;  Location: [place];  Service: Vascular;  Laterality: Right;  Thrombectomy of Radial and ulnar artery.   ENDOVASCULAR STENT INSERTION Left 09/24/2019   Procedure: Endovascular Stent Graft Insertion, Arm Graft;  Surgeon: Serafina Mitchell, MD;  Location: [facility] OR;  Service: Vascular;  Laterality: Left;   EYE SURGERY     laser.  and surgery for DM   fistula     RUE and wrist   FISTULOGRAM Left 09/24/2019   Procedure: SHUNTOGRAM OF ARTERIOVENOUS FISTULA LEFT ARM,;  Surgeon: Serafina Mitchell, MD;  Location: [place];   Service: Vascular;  Laterality: Left;   INSERTION OF DIALYSIS CATHETER Right 01/01/2013   Procedure: INSERTION OF DIALYSIS CATHETER right  internal jugular;  Surgeon: Serafina Mitchell, MD;  Location: [place];  Service: Vascular;  Laterality: Right;   INSERTION OF DIALYSIS CATHETER N/A 03/26/2013   Procedure: INSERTION OF DIALYSIS CATHETER Left internal jugular vein;  Surgeon: Angelia Mould, MD;  Location: [place];  Service: Vascular;  Laterality: N/A;   INSERTION OF DIALYSIS CATHETER Right 11/26/2019   Procedure: INSERTION OF DIALYSIS CATHETER, right internal jugular;  Surgeon: Serafina Mitchell, MD;  Location: [place];  Service: Vascular;  Laterality: Right;   IR THORACENTESIS ASP PLEURAL SPACE W/IMG GUIDE  09/30/2019   LEFT HEART CATH AND CORONARY ANGIOGRAPHY N/A 09/09/2017   Procedure: LEFT HEART CATH AND CORONARY ANGIOGRAPHY;  Surgeon: Troy Sine, MD;  Location: [place] CV LAB;  Service: Cardiovascular;  Laterality: N/A;   LIGATION OF ARTERIOVENOUS  FISTULA Right 12/30/2013   Procedure: REMOVAL OF SEGMENT OF  GORTEX GRAFT AND FISTULA  AND REPAIR OF BRACHIAL ARTERY;  Surgeon: Mal Misty, MD;  Location: [place];  Service: Vascular;  Laterality: Right;   LOWER EXTREMITY ANGIOGRAPHY N/A 06/28/2021   Procedure: Lower Extremity Angiography;  Surgeon: Katha Cabal, MD;  Location: [name] CV LAB;  Service: Cardiovascular;  Laterality: N/A;   LOWER EXTREMITY ANGIOGRAPHY Left 11/23/2021   Procedure: LOWER EXTREMITY ANGIOGRAPHY;  Surgeon: Algernon Huxley, MD;  Location: [place] CV LAB;  Service: Cardiovascular;  Laterality: Left;   MITRAL VALVE REPAIR N/A 09/13/2017   Procedure: MITRAL VALVE REPAIR (MVR);  Surgeon: [place] Nakayama, MD;  Location: [place];  Service: Open Heart Surgery;  Laterality: N/A;   MITRAL VALVE REPAIR (MV)/CORONARY ARTERY BYPASS GRAFTING (CABG)  09/13/2017   PENILE PROSTHESIS IMPLANT     1997.. no card   PERIPHERAL VASCULAR BALLOON ANGIOPLASTY Left  10/04/2017   Procedure: PERIPHERAL VASCULAR BALLOON ANGIOPLASTY;  Surgeon: Elam Dutch, MD;  Location: [place] CV LAB;  Service: Cardiovascular;  Laterality: Left;  AVF   PERIPHERAL VASCULAR BALLOON ANGIOPLASTY Left 07/12/2020   Procedure: PERIPHERAL VASCULAR BALLOON ANGIOPLASTY;  Surgeon: Serafina Mitchell, MD;  Location: [place] CV LAB;  Service: Cardiovascular;  Laterality: Left;  ARM SHUNTOGRAM   REVISION OF ARTERIOVENOUS GORETEX GRAFT Left 09/24/2019   Procedure: REVISION OF ARTERIOVENOUS GORETEX GRAFT LEFT ARM;  Surgeon: Serafina Mitchell, MD;  Location: [facility];  Service: Vascular;  Laterality: Left;   TEE WITHOUT CARDIOVERSION N/A 06/11/2013   Procedure: TRANSESOPHAGEAL ECHOCARDIOGRAM (TEE);  Surgeon: Larey Dresser, MD;  Location[facility];  Service: Cardiovascular;  Laterality: N/A;   TEE WITHOUT CARDIOVERSION N/A 09/13/2017   Procedure: TRANSESOPHAGEAL ECHOCARDIOGRAM (TEE);  Surgeon: [place] Nakayama, MD;  Location: [place];  Service: Open Heart Surgery;  Laterality: N/A;   THROMBECTOMY W/ EMBOLECTOMY Left 03/26/2013   Procedure: Attempted thrombectomy of left arm arteriovenous goretex graft.;  Surgeon: Angelia Mould, MD;  Location: [facility];  Service: Vascular;  Laterality: Left;   ULTRASOUND GUIDANCE FOR VASCULAR ACCESS Bilateral 09/24/2019   Procedure: Ultrasound Guidance For Vascular Access, Right Femoral Vein and Left Arm Graft;  Surgeon: Serafina Mitchell, MD;  Location: [facility] OR;  Service: Vascular;  Laterality: Bilateral;   VENOGRAM N/A 04/07/2013   Procedure: VENOGRAM;  Surgeon: Serafina Mitchell, MD;  Location: [facility] CATH LAB;  Service: Cardiovascular;  Laterality: N/A;    Recent CV Pertinent Labs: Lab Results  Component Value Date   CHOL 122 09/26/2021   HDL 45 09/26/2021   LDLCALC 67 09/26/2021   TRIG 48 09/26/2021   CHOLHDL 2.7 09/26/2021   INR 1.1 09/25/2021   BNP 1,494.1 (H) 09/25/2021   K 4.9 09/28/2021   MG 1.8 07/12/2021   BUN 56 (H) 09/27/2021    BUN 16 12/11/2017   CREATININE 6.62 (H) 09/27/2021    Past medical and surgical history were reviewed and updated in EPIC.  Current Meds  Medication Sig   acetaminophen (TYLENOL) 500 MG tablet Take 1,000 mg by mouth every 6 (six) hours as needed for mild pain.   Amino Acids-Protein Hydrolys (FEEDING SUPPLEMENT, PRO-STAT 64,) LIQD Take 30 mLs by mouth 3 (three) times daily with meals.   amLODipine (NORVASC) 5 MG tablet Take 1 tablet (5 mg total) by mouth daily.   aspirin EC 81 MG tablet Take 1 tablet (81 mg total) by mouth daily.   atorvastatin (LIPITOR) 40 MG tablet Take 40 mg by mouth at bedtime.   clopidogrel (PLAVIX)  75 MG tablet Take 1 tablet (75 mg total) by mouth daily.   levETIRAcetam (KEPPRA) 500 MG tablet Take 1 tablet (500 mg total) by mouth 2 (two) times daily. Only on Tuesday, Thursday, Saturday, Sunday   levETIRAcetam (KEPPRA) 500 MG tablet Take 1 tablet (500 mg total) by mouth 3 (three) times daily. On MWF - administer lunchtime dose once returns from dialysis   multivitamin (RENA-VIT) TABS tablet Take 1 tablet by mouth at bedtime.   Nutritional Supplements (FEEDING SUPPLEMENT, NEPRO CARB STEADY,) LIQD Take 237 mLs by mouth 2 (two) times daily between meals.   pantoprazole (PROTONIX) 40 MG tablet Take 1 tablet (40 mg total) by mouth daily.   polyethylene glycol (MIRALAX / GLYCOLAX) 17 g packet Take 17 g by mouth daily. Dissolve in 4-8 ounces of water   senna-docusate (SENOKOT-S) 8.6-50 MG tablet Take 1 tablet by mouth at bedtime.   sertraline (ZOLOFT) 25 MG tablet Take 25 mg by mouth at bedtime.   sevelamer carbonate (RENVELA) 800 MG tablet Take 800 mg by mouth 3 (three) times daily with meals. On Monday, Wednesday, and Friday. Give NOON dose on MWF asap when returning from dialysis   sevelamer carbonate (RENVELA) 800 MG tablet Take 800 mg by mouth 3 (three) times daily with meals. On Tuesday, Thursday, Saturday, and Sunday (non-dialysis days)    Allergies: Patient has no  known allergies.  Social History   Tobacco Use   Smoking status: Former    Years: 7.00    Types: Cigarettes    Quit date: 06/10/1973    Years since quitting: 48.5   Smokeless tobacco: Never   Tobacco comments:    Quit in 1974.  Vaping Use   Vaping Use: Never used  Substance Use Topics   Alcohol use: No    Alcohol/week: 0.0 standard drinks   Drug use: No    Family History  Problem Relation Age of Onset   Heart disease Father    Diabetes Sister    Diabetes Brother    Diabetes Son     Review of Systems: A 12-system review of systems was performed and was negative except as noted in the HPI.  --------------------------------------------------------------------------------------------------  Physical Exam: BP 120/64 (BP Location: Right Arm, Patient Position: Sitting, Cuff Size: Normal)    Pulse 83    Ht 5\' 8"  (1.727 m)    Wt 160 lb (72.6 kg)    BMI 24.33 kg/m   General:  NAD. Neck: No JVD or HJR. Lungs: Coarse breath sounds bilaterally. Heart: Regular rate and rhythm without murmurs, rubs, or gallops. Abdomen: Soft, nontender, nondistended. Extremities: Patient is status post right AKA.  No edema noted in left lower extremity.  EKG:  Normal sinus rhythm with left axis deviation and lateral T wave inversions.  No significant change from prior tracing on 11/09/2021.  Lab Results  Component Value Date   WBC 6.4 09/26/2021   HGB 10.9 (L) 09/26/2021   HCT 33.1 (L) 09/26/2021   MCV 84.7 09/26/2021   PLT 180 09/26/2021    Lab Results  Component Value Date   NA 134 (L) 09/27/2021   K 4.9 09/28/2021   CL 97 (L) 09/27/2021   CO2 26 09/27/2021   BUN 56 (H) 09/27/2021   CREATININE 6.62 (H) 09/27/2021   GLUCOSE 98 09/27/2021   ALT 49 (H) 09/25/2021    Lab Results  Component Value Date   CHOL 122 09/26/2021   HDL 45 09/26/2021   LDLCALC 67 09/26/2021   TRIG 48 09/26/2021  CHOLHDL 2.7 09/26/2021     --------------------------------------------------------------------------------------------------  ASSESSMENT AND PLAN: Coronary artery disease: No angina reported.  Continue current medications for secondary prevention.  Chronic HFrEF with recovered ejection fraction: Ernest [name] appears euvolemic on examination today.  Given normalized LVEF, will defer adding GDMT at this time.  Continue volume management per nephrology.  PAD: No claudication reported though Ernest [name] is wheelchair-bound.  He is status post PTA of the left anterior tibial and peroneal arteries 2 weeks ago.  Continue DAPT for at least a month; once he has been transition been started on warfarin with therapeutic INR, could consider discontinuation of clopidogrel.  Continue statin therapy and follow-up with vascular surgery.  Persistent atrial flutter: No palpitations reported.  EKG demonstrates sinus rhythm.  Given CHA2DS2-VASc score of at least 7, I agree with previous recommendations for anticoagulation.  Given his history of Ernest [name]-stage renal disease and mitral valve disease status postrepair, warfarin would be the most evidence-based therapy.  We discussed role for apixaban as an alternative but have agreed to initiate warfarin.  Ernest [name] and his caregiver from his nursing home feel that it would be possible for him to come for regular INR checks.  I will refer him to our anticoagulation clinic for initiation of warfarin.  As above, once INR is therapeutic and at least 4 weeks of dual antiplatelet therapy have been completed since left lower extremity angioplasties on 11/23/2021, I would favor stopping clopidogrel.  Yoshi Mancillas-stage renal disease: Per nephrology.  History of stroke: Continue follow-up with neurology as well as aggressive secondary prevention as outlined above.  Hypertension: Blood pressure well controlled today.  Continue amlodipine.  Follow-up: Return to clinic in 3 months.  Nelva Bush,  MD 12/07/2021 10:16 AM

## 2021-12-08 ENCOUNTER — Telehealth: Payer: Self-pay | Admitting: *Deleted

## 2021-12-08 MED ORDER — WARFARIN SODIUM 2.5 MG PO TABS: 2.5000 mg | ORAL_TABLET | Freq: Every day | ORAL | Status: DC

## 2021-12-08 NOTE — Telephone Encounter (Signed)
Nelva Bush, MD  Stana Bunting, RN; Solmon Ice, RN Thank you for the update.  Given that Mr. Barro has end-stage renal disease, I would favor starting his warfarin a little bit lower.  Jinny Blossom, could you have him begin warfarin 2.5 mg daily beginning next Wednesday (12/13/2021) and follow-up the following Wednesday with [facility] (12/20/2021)?  It also looks like he has not had labs in our system in the last month or two.  Would you mind having him get a CBC (no differential), CMP, and INR before starting warfarin?  This could be drawn at our office or at dialysis and sent to our office (I believe he dialyzes on Mondays, Wednesdays, and Fridays).   Durward Mallard and spoke to [place] LPN at [facility].  Gave verbal orders for pt to start Warfarin 2.5 mg daily beginning next Wednesday 12/13/21.  Verbal orders given to draw CBC w/o diff, CMP, INR prior to warfarin start.  Joelene Millin confirmed labs will be drawn tomorrow 12/09/21 and will fax results to our office.  New Coumadin start appointment scheduled with [facility] 12/20/21 at 1030.  Joelene Millin confirms transportation will be arranged for this appt for pt.   Will follow up regarding lab results next week and will forward to Dr. Saunders Revel.

## 2021-12-12 NOTE — Telephone Encounter (Signed)
Attempted to contact [facility] to follow up with lab results that were to be drawn 2/25.  Unable to reach nurse at this time. Phone continued to ring. No voicemail.  Will try to reach at later time.

## 2021-12-12 NOTE — Telephone Encounter (Signed)
Spoke with CNA who states pt's nurse is unable to answer phone at this time.  Requested pt's labs from 2/25 to be faxed to our office at this RN's attention.  Unable to speak to anyone at this time regarding pt's care.  CNA states she will have pt's nurse call back and fax results.

## 2021-12-12 NOTE — Telephone Encounter (Signed)
Called and spoke with pt's nurse this afternoon.  Nurse confirms pt is scheduled to start warfarin 2.5 mg daily tomorrow 12/13/21.  Labs have been faxed and scanned to pt's chart under labs tab.  Will forward to Dr. Saunders Revel for review.

## 2021-12-12 NOTE — Telephone Encounter (Signed)
Labs reviewed.  No significant abnormalities to preclude initiation of warfarin.  Ernest [name] should begin taking warfarin 2.5 mg daily around dinnertime beginning tomorrow and follow-up in our anticoagulation next Wednesday for ongoing management.  Nelva Bush, MD [facility]

## 2021-12-20 NOTE — Telephone Encounter (Signed)
Pt missed new coumadin visit today at 1030.  ?Called and spoke to pt's nurse at [facility]. States pt had dialysis so unable to make appt.  ?Pt has dialysis Monday, Wednesday, Fridays.  ?Confirmed that pt has been taking warfarin 2.5 mg daily at facility.  ?Verbal order given to draw INR today per Dr. Saunders Revel.  ?Will schedule pt to come in next week either before or after dialysis.  ?Attempted to call pt's nurse back, no answer.  ?Will try again at later time.  ?

## 2021-12-21 NOTE — Telephone Encounter (Signed)
Attempted to call [facility], asked to speak to nurse.  ?Phone continued to ring, no answer.  ? ?Will try to reach later today.  ? ?

## 2021-12-21 NOTE — Telephone Encounter (Signed)
[place] again, nurse unable to provide information regarding pt's dialysis appointments.  ?Notified that we need to get pt in for his coumadin visit, also need his INR results faxed to our office that were supposed to be drawn yesterday, verbal orders given.  ? ?I was transferred to someone else, and left on hold for ~5 minutes.  ? ?Called back and asked to speak to supervisor or DON.  ?Was directed to the DON, no answer. Left voicemail requesting call back to discuss this pt's coumadin management, give orders and to have appointment arranged in our office.  ?

## 2021-12-21 NOTE — Telephone Encounter (Signed)
Thank you for the update.  Given subtherapeutic INR after 1 week of warfarin 2.5 mg daily, I suggest increasing warfarin to 5 mg daily beginning tomorrow.  Patient will need to follow-up with our anticoagulation clinic next Wednesday.  If he is unable to make this appointment and maintain consistent follow-up, I think it would be too risky to continue warfarin.  We would need to consider switching to apixaban versus deferring anticoagulation altogether. ? ?Nelva Bush, MD ?[facility] ? ?

## 2021-12-21 NOTE — Telephone Encounter (Signed)
Spoke with Reggie LPN at [facility]. Reviewed provider recommendations and order for patient to start taking increased dose of warfarin (Coumadin) 5 mg daily and that patient needs to keep appointment for INR check next Wednesday 12/27/21 at 2:30 pm. He read back all instructions and confirmed appointment. Reggie LPN did request copy of ordered changes to be picked up at that INR appointment. He verbalized understanding of instructions with no further questions at this time.  ? ?Reviewed information with Dr. Darnelle Bos nurse Jinny Blossom. Prescription written and pending provider signature. Once signed we need to fax (number is [phone]) and then place in [place] room and send them message to anticoag pool.   ?

## 2021-12-21 NOTE — Telephone Encounter (Signed)
DON returned call. DON verified pt did have PT/INR drawn yesterday at facility.  ?She will fax results - gives verbal report PT 15.0  INR 1.5.  ?Pt continues Warfarin 2.5 mg daily.  ?Awaiting fax with results and will scan to chart.  ? ?Pt does have dialysis M,W,F.  ?Pt begins dialysis at 7 AM. DON states pt may come to our office right after dialysis.  ?I have scheduled his new coumadin visit for next Wednesday 12/27/21 at 2:30 PM.  ?DON confirms transportation arranged. Pt may arrive early d/t transportation bringing right after dialysis.  ?Appt notes updated to make aware.  ? ? ?

## 2021-12-22 MED ORDER — WARFARIN SODIUM 5 MG PO TABS: 5.0000 mg | ORAL_TABLET | Freq: Every day | ORAL | Status: DC

## 2021-12-22 NOTE — Addendum Note (Signed)
Addended by: Darlyne Russian on: 12/22/2021 09:27 AM ? ? Modules accepted: Orders ? ?

## 2021-12-22 NOTE — Telephone Encounter (Signed)
Prescription signed and faxed to [facility] @ [phone] with confirmation fax received.  ?Placed on anticoag desk for [place].  ?

## 2021-12-26 ENCOUNTER — Other Ambulatory Visit: Payer: Self-pay

## 2021-12-26 ENCOUNTER — Non-Acute Institutional Stay
Admission: EM | Admit: 2021-12-26 | Discharge: 2021-12-26 | Disposition: A | Discharge status: Skilled Nursing Facility | Payer: Medicare (Managed Care) | Attending: Emergency Medicine | Admitting: Emergency Medicine

## 2021-12-26 DIAGNOSIS — I2510 Atherosclerotic heart disease of native coronary artery without angina pectoris: Secondary | ICD-10-CM | POA: Insufficient documentation

## 2021-12-26 DIAGNOSIS — Z992 Dependence on renal dialysis: Secondary | ICD-10-CM | POA: Diagnosis not present

## 2021-12-26 DIAGNOSIS — N186 End stage renal disease: Secondary | ICD-10-CM | POA: Insufficient documentation

## 2021-12-26 DIAGNOSIS — I4892 Unspecified atrial flutter: Secondary | ICD-10-CM | POA: Diagnosis not present

## 2021-12-26 DIAGNOSIS — I509 Heart failure, unspecified: Secondary | ICD-10-CM | POA: Insufficient documentation

## 2021-12-26 LAB — BASIC METABOLIC PANEL
Anion gap: 9 (ref 5–15)
BUN: 58 mg/dL — ABNORMAL HIGH (ref 8–23)
CO2: 32 mmol/L (ref 22–32)
Calcium: 8.5 mg/dL — ABNORMAL LOW (ref 8.9–10.3)
Chloride: 97 mmol/L — ABNORMAL LOW (ref 98–111)
Creatinine, Ser: 7.45 mg/dL — ABNORMAL HIGH (ref 0.61–1.24)
GFR, Estimated: 7 mL/min — ABNORMAL LOW (ref >60)
Glucose, Bld: 181 mg/dL — ABNORMAL HIGH (ref 70–99)
Potassium: 3.9 mmol/L (ref 3.5–5.1)
Sodium: 138 mmol/L (ref 135–145)

## 2021-12-26 LAB — CBC WITH DIFFERENTIAL/PLATELET
Abs Immature Granulocytes: 0.02 10*3/uL (ref 0.00–0.07)
Basophils Absolute: 0.1 10*3/uL (ref 0.0–0.1)
Basophils Relative: 1 %
Eosinophils Absolute: 0.4 10*3/uL (ref 0.0–0.5)
Eosinophils Relative: 5 %
HCT: 28.8 % — ABNORMAL LOW (ref 39.0–52.0)
Hemoglobin: 9.1 g/dL — ABNORMAL LOW (ref 13.0–17.0)
Immature Granulocytes: 0 %
Lymphocytes Relative: 19 %
Lymphs Abs: 1.4 10*3/uL (ref 0.7–4.0)
MCH: 27.5 pg (ref 26.0–34.0)
MCHC: 31.6 g/dL (ref 30.0–36.0)
MCV: 87 fL (ref 80.0–100.0)
Monocytes Absolute: 0.7 10*3/uL (ref 0.1–1.0)
Monocytes Relative: 10 %
Neutro Abs: 5 10*3/uL (ref 1.7–7.7)
Neutrophils Relative %: 65 %
Platelets: 249 10*3/uL (ref 150–400)
RBC: 3.31 MIL/uL — ABNORMAL LOW (ref 4.22–5.81)
RDW: 15.8 % — ABNORMAL HIGH (ref 11.5–15.5)
WBC: 7.6 10*3/uL (ref 4.0–10.5)
nRBC: 0 % (ref 0.0–0.2)

## 2021-12-26 LAB — CBG MONITORING, ED: Glucose-Capillary: 164 mg/dL — ABNORMAL HIGH (ref 70–99)

## 2021-12-26 MED ORDER — SODIUM CHLORIDE 0.9 % IV SOLN: 100.0000 mL | INTRAVENOUS | Status: DC | PRN

## 2021-12-26 MED ORDER — PENTAFLUOROPROP-TETRAFLUOROETH EX AERO
1.0000 "application " | INHALATION_SPRAY | CUTANEOUS | Status: DC | PRN
  Filled 2021-12-26: qty 30

## 2021-12-26 MED ORDER — LIDOCAINE-PRILOCAINE 2.5-2.5 % EX CREA: 1.0000 "application " | TOPICAL_CREAM | CUTANEOUS | Status: DC | PRN

## 2021-12-26 MED ORDER — ALTEPLASE 2 MG IJ SOLR
2.0000 mg | Freq: Once | INTRAMUSCULAR | Status: DC | PRN
  Filled 2021-12-26: qty 2

## 2021-12-26 MED ORDER — LIDOCAINE HCL (PF) 1 % IJ SOLN: 5.0000 mL | INTRAMUSCULAR | Status: DC | PRN

## 2021-12-26 MED ORDER — CHLORHEXIDINE GLUCONATE CLOTH 2 % EX PADS
6.0000 | MEDICATED_PAD | Freq: Every day | CUTANEOUS | Status: DC
  Filled 2021-12-26: qty 6

## 2021-12-26 MED ORDER — HEPARIN SODIUM (PORCINE) 1000 UNIT/ML DIALYSIS
1000.0000 [IU] | INTRAMUSCULAR | Status: DC | PRN
  Filled 2021-12-26: qty 1

## 2021-12-26 NOTE — ED Notes (Signed)
C-Wing @ [facility] contacted and RN Stanton Kidney provided report to [place] on pts return to facility.  ?

## 2021-12-26 NOTE — ED Notes (Signed)
This RN spoke with pt's daughter [name] at this time and got a verbal consent from pt's daughter to treat this pt with dialysis. Consent signed by this RN and Charge RN ?

## 2021-12-26 NOTE — ED Provider Notes (Signed)
? ?[facility] ?Provider Note ? ? ? Event Date/Time  ? First MD Initiated Contact with Patient 12/26/21 1021   ?  (approximate) ? ? ?History  ? ?Dialysis ? ? ?HPI ? ?Ernest [name] is a 76 y.o. male  who, per cardiology office note dated 12/07/21 has history of CAD, CHF, atrial flutter, ESRD, who presents to the emergency department today from [facility] because he has not been to dialysis recently. The patient himself is not completely oriented so cannot give great history, but does state he is here for dialysis.  He denies any shortness of breath. Denies any recent illness or fevers.   ? ? ?Physical Exam  ? ?Triage Vital Signs: ?ED Triage Vitals [12/26/21 1018]  ?Enc Vitals Group  ?   BP (!) 150/76  ?   Pulse Rate 76  ?   Resp 16  ?   Temp 98.9 ?F (37.2 ?C)  ?   Temp Source Oral  ?   SpO2 100 %  ?   Weight 160 lb 15 oz (73 kg)  ?   Height 5\' 8"  (1.727 m)  ?   Head Circumference   ?   Peak Flow   ?   Pain Score 0  ? ?Most recent vital signs: ?Vitals:  ? 12/26/21 1018  ?BP: (!) 150/76  ?Pulse: 76  ?Resp: 16  ?Temp: 98.9 ?F (37.2 ?C)  ?SpO2: 100%  ? ? ?General: Awake, no distress.  ?CV:  Good peripheral perfusion.  ?Resp:  Normal effort.  ?Abd:  No distention.  ? ? ? ?ED Results / Procedures / Treatments  ? ?Labs ?(all labs ordered are listed, but only abnormal results are displayed) ?Labs Reviewed  ?BASIC METABOLIC PANEL - Abnormal; Notable for the following components:  ?    Result Value  ? Chloride 97 (*)   ? Glucose, Bld 181 (*)   ? BUN 58 (*)   ? Creatinine, Ser 7.45 (*)   ? Calcium 8.5 (*)   ? GFR, Estimated 7 (*)   ? All other components within normal limits  ?CBC WITH DIFFERENTIAL/PLATELET - Abnormal; Notable for the following components:  ? RBC 3.31 (*)   ? Hemoglobin 9.1 (*)   ? HCT 28.8 (*)   ? RDW 15.8 (*)   ? All other components within normal limits  ?CBG MONITORING, ED - Abnormal; Notable for the following components:  ? Glucose-Capillary 164 (*)   ? All other components  within normal limits  ? ? ? ?EKG ? ?INance Pear, attending physician, personally viewed and interpreted this EKG ? ?EKG Time: 1016 ?Rate: 76 ?Rhythm: sinus rhythm ?Axis: left axis deviation ?Intervals: qtc 612 ?QRS: narrow ?ST changes: no st elevation ?Impression: abnormal ekg ? ?RADIOLOGY ?None ? ? ?PROCEDURES: ? ?Critical Care performed: No ? ?Procedures ? ? ?MEDICATIONS ORDERED IN ED: ?Medications - No data to display ? ? ?IMPRESSION / MDM / ASSESSMENT AND PLAN / ED COURSE  ?I reviewed the triage vital signs and the nursing notes. ?             ?               ? ?Differential diagnosis includes, but is not limited to, missed dialysis. ? ?Patient presented to the emergency department today from [facility] because of concerns for missed dialysis sessions.  Patient is asymptomatic.  Blood work without hyperkalemia. Discussed with Dr. Holley Raring with nephrology who will help arrange dialysis today. Patient  will likely be able to be discharged after dialysis.  ? ? ?FINAL CLINICAL IMPRESSION(S) / ED DIAGNOSES  ? ?Final diagnoses:  ?Dialysis patient [facility])  ? ? ?Note:  This document was prepared using Dragon voice recognition software and may include unintentional dictation errors. ? ?  ?Nance Pear, MD ?12/26/21 1252 ? ?

## 2021-12-26 NOTE — TOC Initial Note (Signed)
Transition of Care (TOC) - Initial/Assessment Note  ? ? ?Patient Details  ?Name: Ernest [name] ?MRN: [id number] ?Date of Birth: 07-01-1946 ? ?Transition of Care (TOC) CM/SW Contact:    ?Shelbie Hutching, RN ?Phone Number: ?12/26/2021, 4:39 PM ? ?Clinical Narrative:                 ?Patient is a dialysis patient who apparently came to the emergency room because he missed dialysis treatment yesterday, regular MWF.  Per Neoma Laming at [facility] the patient's daughter has been planning for patient to move to [place] where she lives but he has not been accepted to any facility down there.  Daughter went ahead and canceled all of the patient's services including dialysis.   ?Patient's treatment schedule has now been resumed and he has a chair time at 7 am tomorrow morning.   ?Plan for today is for patient to get a shortened treatment today here at [facility] then discharge back to [facility] and get his regular dialysis tomorrow.   ? ?Expected Discharge Plan: [place] ?Barriers to Discharge: Barriers Resolved ? ? ?Patient Goals and CMS Choice ?  ?  ?  ? ?Expected Discharge Plan and Services ?Expected Discharge Plan: [place] ?  ?  ?  ?Living arrangements for the past 2 months: [place] ?                ?DME Arranged: N/A ?DME Agency: NA ?  ?  ?  ?HH Arranged: NA ?[place] Agency: NA ?  ?  ?  ? ?Prior Living Arrangements/Services ?Living arrangements for the past 2 months: [place] ?Lives with:: Facility Resident ?Patient language and need for interpreter reviewed:: Yes ?Do you feel safe going back to the place where you live?: Yes      ?Need for Family Participation in Patient Care: Yes (Comment) ?Care giver support system in place?: Yes (comment) (daughter) ?  ?Criminal Activity/Legal Involvement Pertinent to Current Situation/Hospitalization: No - Comment as needed ? ?Activities of Daily Living ?  ?  ? ?Permission Sought/Granted ?Permission sought to share information with :  Customer service manager ?  ?   ? Permission granted to share info w AGENCY: [facility] ?   ?   ? ?Emotional Assessment ?  ?  ?  ?  ?Alcohol / Substance Use: Not Applicable ?Psych Involvement: No (comment) ? ?Admission diagnosis:  Dialysis patient [facility]) [Z99.2] ?Patient Active Problem List  ? Diagnosis Date Noted  ? Chronic respiratory failure with hypoxia ([place]) 10/02/2021  ? Slurred speech 10/02/2021  ? Osteomyelitis of right foot ([place])   ? Acquired absence of right leg above knee ([place]) 07/21/2021  ? Pressure injury of skin 07/19/2021  ? Pressure ulcer 07/16/2021  ? Hypotension   ? Wet gangrene ([place]) 07/10/2021  ? S/P AKA (above knee amputation), right ([place]) 07/10/2021  ? PAD (peripheral artery disease) ([place]) 07/08/2021  ? Sepsis ([place]) 06/27/2021  ? Bacterial infection, unspecified 03/31/2021  ? Contact with and (suspected) exposure to covid-19 03/13/2021  ? COVID-19 03/07/2021  ? Disorder of phosphorus metabolism, unspecified 02/20/2021  ? Hypercalcemia 12/08/2020  ? Atherosclerotic heart disease of native coronary artery without angina pectoris 11/18/2020  ? Diarrhea, unspecified 11/18/2020  ? Fever, unspecified 11/18/2020  ? Other fluid overload 11/18/2020  ? Pain, unspecified 11/18/2020  ? Pleural effusion on left 09/30/2019  ? Acute respiratory failure with hypoxia ([place]) 09/30/2019  ? Pleural effusion 09/30/2019  ? Chest pain 12/02/2018  ?  Vascular dementia ([place]) 10/10/2017  ? Atrial flutter ([place]) 10/10/2017  ? Altered mental status, unspecified 10/09/2017  ? Other malaise 10/09/2017  ? S/P CABG x 4 10/04/2017  ? Cerebral embolism with cerebral infarction 09/20/2017  ? NSVT (nonsustained ventricular tachycardia)   ? Dyspnea 09/08/2017  ? Non-ST elevation MI (NSTEMI) ([place])   ? Acute on chronic combined systolic and diastolic CHF (congestive heart failure) ([place])   ? End-stage renal disease on hemodialysis ([place])   ? Left leg weakness   ? TIA (transient ischemic attack) 10/27/2016  ? Dependence on  renal dialysis ([place]) 08/09/2015  ? History of CVA with residual deficit 08/07/2015  ? Seizure, late effect of stroke ([place]) 08/07/2015  ? Diabetic peripheral neuropathy ([place]) 08/07/2015  ? Unspecified protein-calorie malnutrition ([place]) 05/27/2015  ? Seizure ([place]) 04/29/2015  ? Coagulation defect, unspecified ([place]) 07/16/2014  ? Type 2 diabetes mellitus with diabetic peripheral angiopathy without gangrene ([place]) 07/01/2014  ? Pruritus, unspecified 05/26/2014  ? Elevated prostate specific antigen (PSA) 08/03/2013  ? Encounter for immunization 07/10/2013  ? Obstructive sleep apnea 06/16/2013  ? Hyperlipidemia associated with type 2 diabetes mellitus ([place])   ? ESRD (end stage renal disease) ([place])   ? Erectile dysfunction associated with type 2 diabetes mellitus ([place])   ? Hypertension due to end stage renal disease caused by type 2 diabetes mellitus, on dialysis [facility])   ? COPD (chronic obstructive pulmonary disease) ([place])   ? Chronic combined systolic and diastolic CHF (congestive heart failure) ([place])   ? Anemia of chronic disease 12/27/2011  ? Long term (current) use of insulin ([place]) 11/03/2009  ? Gout, unspecified 02/28/2009  ? Iron deficiency anemia, unspecified 10/30/2008  ? Secondary hyperparathyroidism of renal origin ([place]) 10/30/2008  ? Type 2 diabetes mellitus with hyperglycemia, without long-term current use of insulin ([place]) 06/08/1984  ? ?PCP:  Brayton Mars, MD ?Pharmacy:   ?[facility] [place] ?[address] ?[place] [address] ?Phone: [phone] Fax: [phone] ? ? ? ? ?Social Determinants of Health (SDOH) Interventions ?  ? ?Readmission Risk Interventions ?Readmission Risk Prevention Plan 06/28/2021  ?Transportation Screening Complete  ?Medication Review Press photographer) Complete  ?PCP or Specialist appointment within 3-5 days of discharge Complete  ?[place] or Home Care Consult Complete  ?SW Recovery Care/Counseling Consult Complete  ?Palliative  Care Screening Not Applicable  ?Skilled Nursing Facility Complete  ?Some recent data might be hidden  ? ? ? ?

## 2021-12-26 NOTE — Progress Notes (Signed)
Patient tolerates hemodialysis treatment without incident, right thigh catheter functions well.Targeted UF met with 0.5 liter fluid removal. Dressing to catheter changed. Patient returned to the ED.  ?

## 2021-12-26 NOTE — ED Notes (Signed)
Pt taken to dialysis at this time.

## 2021-12-26 NOTE — ED Provider Notes (Signed)
----------------------------------------- ?  5:55 PM on 12/26/2021 ?----------------------------------------- ?Patient has returned from dialysis.  He has no complaints.  Patient will be discharged home with routine dialysis. ?  ?Harvest Dark, MD ?12/26/21 1755 ? ?

## 2021-12-26 NOTE — ED Notes (Signed)
RN again called [facility] for pt report, no answer. Charge, RN notified, will assist. ?

## 2021-12-26 NOTE — ED Notes (Addendum)
RN spoke to Ernest [name] at [facility] to alert that patient would be returning. RN phone call then transferred, no answer. Attempted to call back a second time, with no answer. This RN received in shift report that previous RN had called and alerted that pt would be returning, and was instructed to call back at 1915. ?

## 2021-12-26 NOTE — Progress Notes (Signed)
?Ernest [name]  ?ROUNDING NOTE  ? ?Subjective:  ? ?Ernest [name] is a 76 year old African-American male with medical conditions including CAD, CHF, atrial flutter, and end-stage renal disease requiring dialysis.  Patient presents to the emergency department from his skilled living facility at [facility] with complaint of recently missing dialysis.  ? ?Patient is known to our practice and receives outpatient dialysis treatments at [facility] on a MWF schedule.  Last treatment received on Friday.  When speaking with outpatient clinic, patient missed Monday's treatment due to patient and family's previous plan to move patient out of state on Saturday.  Patient denies shortness of breath, remains on baseline oxygen requirement of 2 L.  Patient currently denies any chest pain or other discomfort.  Tolerating meals without nausea and vomiting. ? ?Lab work obtained on ED arrival stable for renal patient.  Blood pressure elevated at 150/76. ? ?We have been consulted to evaluate and provide dialysis if needed. ? ? ?Objective:  ?Vital signs in last 24 hours:  ?Temp:  [97.6 ?F (36.4 ?C)-98.9 ?F (37.2 ?C)] 97.6 ?F (36.4 ?C) (03/14 1421) ?Pulse Rate:  [68-84] 83 (03/14 1445) ?Resp:  [12-16] 13 (03/14 1445) ?BP: (103-150)/(66-86) 103/66 (03/14 1445) ?SpO2:  [100 %] 100 % (03/14 1330) ?Weight:  [73 kg] 73 kg (03/14 1018) ? ?Weight change:  ?Filed Weights  ? 12/26/21 1018  ?Weight: 73 kg  ? ? ?Intake/Output: ?No intake/output data recorded. ?  ?Intake/Output this shift: ? No intake/output data recorded. ? ?Physical Exam: ?General: NAD, resting comfortably  ?Head: Normocephalic, atraumatic. Moist oral mucosal membranes  ?Eyes: Anicteric  ?Lungs:  Clear to auscultation, normal effort, 2 L O2  ?Heart: Regular rate and rhythm  ?Abdomen:  Soft, nontender, nondistended  ?Extremities: No peripheral edema.  ?Neurologic: Nonfocal, moving all four extremities  ?Skin: No lesions, dry  ?Access: Right thigh PermCath   ? ? ?Basic Metabolic Panel: ?Recent Labs  ?Lab 12/26/21 ?1022  ?NA 138  ?K 3.9  ?CL 97*  ?CO2 32  ?GLUCOSE 181*  ?BUN 58*  ?CREATININE 7.45*  ?CALCIUM 8.5*  ? ? ?Liver Function Tests: ?No results for input(s): AST, ALT, ALKPHOS, BILITOT, PROT, ALBUMIN in the last 168 hours. ?No results for input(s): LIPASE, AMYLASE in the last 168 hours. ?No results for input(s): AMMONIA in the last 168 hours. ? ?CBC: ?Recent Labs  ?Lab 12/26/21 ?1022  ?WBC 7.6  ?NEUTROABS 5.0  ?HGB 9.1*  ?HCT 28.8*  ?MCV 87.0  ?PLT 249  ? ? ?Cardiac Enzymes: ?No results for input(s): CKTOTAL, CKMB, CKMBINDEX, TROPONINI in the last 168 hours. ? ?BNP: ?Invalid input(s): POCBNP ? ?CBG: ?Recent Labs  ?Lab 12/26/21 ?1019  ?GLUCAP 164*  ? ? ?Microbiology: ?Results for orders placed or performed during the hospital encounter of 09/25/21  ?Resp Panel by RT-PCR (Flu A&B, Covid) Nasopharyngeal Swab     Status: None  ? Collection Time: 09/27/21  6:50 PM  ? Specimen: Nasopharyngeal Swab; Nasopharyngeal(NP) swabs in vial transport medium  ?Result Value Ref Range Status  ? SARS Coronavirus 2 by RT PCR NEGATIVE NEGATIVE Final  ?  Comment: (NOTE) ?SARS-CoV-2 target nucleic acids are NOT DETECTED. ? ?The SARS-CoV-2 RNA is generally detectable in upper respiratory ?specimens during the acute phase of infection. The lowest ?concentration of SARS-CoV-2 viral copies this assay can detect is ?138 copies/mL. A negative result does not preclude SARS-Cov-2 ?infection and should not be used as the sole basis for treatment or ?other patient management decisions. A negative result may occur  with  ?improper specimen collection/handling, submission of specimen other ?than nasopharyngeal swab, presence of viral mutation(s) within the ?areas targeted by this assay, and inadequate number of viral ?copies(<138 copies/mL). A negative result must be combined with ?clinical observations, patient history, and epidemiological ?information. The expected result is Negative. ? ?Fact Sheet  for Patients:  ?EntrepreneurPulse.com.au ? ?Fact Sheet for Healthcare Providers:  ?IncredibleEmployment.be ? ?This test is no t yet approved or cleared by the [place] FDA and  ?has been authorized for detection and/or diagnosis of SARS-CoV-2 by ?FDA under an Emergency Use Authorization (EUA). This EUA will remain  ?in effect (meaning this test can be used) for the duration of the ?COVID-19 declaration under Section 564(b)(1) of the Act, 21 ?U.S.C.section 360bbb-3(b)(1), unless the authorization is terminated  ?or revoked sooner.  ? ? ?  ? Influenza A by PCR NEGATIVE NEGATIVE Final  ? Influenza B by PCR NEGATIVE NEGATIVE Final  ?  Comment: (NOTE) ?The Xpert Xpress SARS-CoV-2/FLU/RSV plus assay is intended as an aid ?in the diagnosis of influenza from Nasopharyngeal swab specimens and ?should not be used as a sole basis for treatment. Nasal washings and ?aspirates are unacceptable for Xpert Xpress SARS-CoV-2/FLU/RSV ?testing. ? ?Fact Sheet for Patients: ?EntrepreneurPulse.com.au ? ?Fact Sheet for Healthcare Providers: ?IncredibleEmployment.be ? ?This test is not yet approved or cleared by the [place] FDA and ?has been authorized for detection and/or diagnosis of SARS-CoV-2 by ?FDA under an Emergency Use Authorization (EUA). This EUA will remain ?in effect (meaning this test can be used) for the duration of the ?COVID-19 declaration under Section 564(b)(1) of the Act, 21 U.S.C. ?section 360bbb-3(b)(1), unless the authorization is terminated or ?revoked. ? ?Performed at [facility], [place], ?[place] [address] ?  ? ? ?Coagulation Studies: ?No results for input(s): LABPROT, INR in the last 72 hours. ? ?Urinalysis: ?No results for input(s): COLORURINE, LABSPEC, [place], GLUCOSEU, HGBUR, BILIRUBINUR, KETONESUR, PROTEINUR, UROBILINOGEN, NITRITE, LEUKOCYTESUR in the last 72 hours. ? ?Invalid input(s): APPERANCEUR   ? ? ?Imaging: ?No results found. ? ? ?Medications:  ? ? sodium chloride    ? sodium chloride    ? ? [START ON 12/27/2021] Chlorhexidine Gluconate Cloth  6 each Topical Q0600  ? ?sodium chloride, sodium chloride, alteplase, heparin, lidocaine (PF), lidocaine-prilocaine, pentafluoroprop-tetrafluoroeth ? ?Assessment/ Plan:  ?Mr. NISHAWN ROTAN is a 76 y.o.  male with medical conditions including CAD, CHF, atrial flutter, and end-stage renal disease requiring dialysis.  Patient presents to the emergency department from his skilled living facility at [facility] with complaint of recently missing dialysis.  ? ?Hypertensive urgency possibly due to missed dialysis treatment.  Admission blood pressure 150/76.  Home regimen includes amlodipine.  Blood pressure has improved to 103/66. ? ?2.  End-stage renal disease on hemodialysis.  After speaking with outpatient clinic, it is confirmed patient missed 1 dialysis treatment on Monday.  Patient will receive shortened treatment today and will resume outpatient treatment tomorrow. ? ?3. Anemia of chronic [name] disease ?Lab Results  ?Component Value Date  ? HGB 9.1 (L) 12/26/2021  ?Hemoglobin within acceptable range ? ?4. Secondary Hyperparathyroidism:   ?Lab Results  ?Component Value Date  ? PTH 63 10/05/2017  ? PTH Comment 10/05/2017  ? CALCIUM 8.5 (L) 12/26/2021  ? CAION 1.05 (L) 07/12/2020  ? PHOS 3.9 09/26/2021  ?  ?Calcium within acceptable range. ?Sevelamer ordered outpatient with meals ? ? LOS: 0 ?[place]  ?3/14/20232:52 PM ?  ?

## 2021-12-26 NOTE — ED Triage Notes (Signed)
Pt from [facility] via [employer], [employer], pt has not had dialysis in 4-5 days d/t daughter cancelling appointments because she is trying to get him out of [facility] and to [place] with her. Daughter tried to set up appt today for pt but the dialysis center didn't have any beds so facility called to send pt here. Pt in NAD, pt reports no symptoms, no SOB, etc.  ?Hx a-flutter, DNR ?137/47 ?100% 3L [place] (Baseline O2) ?78HR ?BGL 202 ?

## 2021-12-26 NOTE — Discharge Instructions (Signed)
Please seek medical attention for any high fevers, chest pain, shortness of breath, change in behavior, persistent vomiting, bloody stool or any other new or concerning symptoms.  

## 2021-12-26 NOTE — ED Notes (Signed)
[facility]  CALLED  FOR  TRANSPORT  TO  [facility] 

## 2021-12-27 ENCOUNTER — Ambulatory Visit (INDEPENDENT_AMBULATORY_CARE_PROVIDER_SITE_OTHER): Payer: Medicare (Managed Care)

## 2021-12-27 ENCOUNTER — Telehealth: Payer: Self-pay | Admitting: Internal Medicine

## 2021-12-27 DIAGNOSIS — Z5181 Encounter for therapeutic drug level monitoring: Secondary | ICD-10-CM

## 2021-12-27 DIAGNOSIS — I4892 Unspecified atrial flutter: Secondary | ICD-10-CM | POA: Diagnosis not present

## 2021-12-27 DIAGNOSIS — G459 Transient cerebral ischemic attack, unspecified: Secondary | ICD-10-CM

## 2021-12-27 LAB — POCT INR: INR: 4.5 — AB (ref 2.0–3.0)

## 2021-12-27 NOTE — Telephone Encounter (Signed)
Patient was made aware of insurance OON could not reply. [facility] transportation brought patient was notified & called facility was told needed to call family members. Also called family members listed on DPR and patient contact list with no answer & could not leave a message. Made nurses aware of all information & all contacts made. ?

## 2021-12-27 NOTE — Patient Instructions (Signed)
-   skip warfarin tonight and tomorrow, then ?- START NEW DOSAGE of warfarin of 1 tablet every day except 1/2 tablet on MONDAYS, WEDNESDAYS, AND FRIDAYS ?- Recheck INR in 1 week ?[facility] [phone] ?

## 2022-01-29 ENCOUNTER — Other Ambulatory Visit (INDEPENDENT_AMBULATORY_CARE_PROVIDER_SITE_OTHER): Payer: Self-pay | Admitting: Vascular Surgery

## 2022-01-29 DIAGNOSIS — I70249 Atherosclerosis of native arteries of left leg with ulceration of unspecified site: Secondary | ICD-10-CM

## 2022-01-29 DIAGNOSIS — Z9862 Peripheral vascular angioplasty status: Secondary | ICD-10-CM

## 2022-01-30 ENCOUNTER — Ambulatory Visit (INDEPENDENT_AMBULATORY_CARE_PROVIDER_SITE_OTHER): Payer: Medicare (Managed Care) | Admitting: Nurse Practitioner

## 2022-01-30 ENCOUNTER — Encounter (INDEPENDENT_AMBULATORY_CARE_PROVIDER_SITE_OTHER): Payer: Self-pay | Admitting: Nurse Practitioner

## 2022-01-30 ENCOUNTER — Ambulatory Visit (INDEPENDENT_AMBULATORY_CARE_PROVIDER_SITE_OTHER): Payer: Medicare (Managed Care)

## 2022-01-30 VITALS — BP 131/81 | HR 84 | Resp 15

## 2022-01-30 DIAGNOSIS — I70249 Atherosclerosis of native arteries of left leg with ulceration of unspecified site: Secondary | ICD-10-CM | POA: Diagnosis not present

## 2022-01-30 DIAGNOSIS — E1122 Type 2 diabetes mellitus with diabetic chronic kidney disease: Secondary | ICD-10-CM | POA: Diagnosis not present

## 2022-01-30 DIAGNOSIS — N186 End stage renal disease: Secondary | ICD-10-CM

## 2022-01-30 DIAGNOSIS — E1165 Type 2 diabetes mellitus with hyperglycemia: Secondary | ICD-10-CM | POA: Diagnosis not present

## 2022-01-30 DIAGNOSIS — I120 Hypertensive chronic kidney disease with stage 5 chronic kidney disease or end stage renal disease: Secondary | ICD-10-CM

## 2022-01-30 DIAGNOSIS — Z992 Dependence on renal dialysis: Secondary | ICD-10-CM

## 2022-01-30 DIAGNOSIS — Z9862 Peripheral vascular angioplasty status: Secondary | ICD-10-CM

## 2022-02-11 ENCOUNTER — Encounter (INDEPENDENT_AMBULATORY_CARE_PROVIDER_SITE_OTHER): Payer: Self-pay | Admitting: Nurse Practitioner

## 2022-02-11 NOTE — Progress Notes (Signed)
? ?Subjective:  ? ? Patient ID: Ernest [name], male    DOB: 1946/04/23, 76 y.o.   MRN: [id number] ?Chief Complaint  ?Patient presents with  ? Follow-up  ?  Ultrasound follow up  ? ? ?The patient returns to the office for followup and review status post angiogram with intervention on 11/23/2021.  ? ?Procedure: ?Procedure(s) Performed: ?            1.  Ultrasound guidance for vascular access right femoral artery ?            2.  Catheter placement into left SFA from right femoral approach ?            3.  Aortogram and selective left lower extremity angiogram ?            4.  Percutaneous transluminal angioplasty of left anterior tibial artery with 3 mm diameter by 10 cm length angioplasty balloon ?            5.  Percutaneous transluminal angioplasty of the left peroneal artery with 2.5 mm diameter by 22 cm length angioplasty balloon ?            6.  StarClose closure device right femoral artery ? ?The patient notes continues to have a wound followed by podiatry. ? ?There have been no significant changes to the patient's overall health care. ? ?No documented history of amaurosis fugax or recent TIA symptoms. There are no recent neurological changes noted. ?No documented history of DVT, PE or superficial thrombophlebitis. ?The patient denies recent episodes of angina or shortness of breath.  ? ?ABI's Rt=bka and Lt=1.19  (previous ABI's Rt=bka and Lt=1.7) ?Duplex US of the left lower extremity shows biphasic/triphasic waveforms. ? ? ?Review of Systems  ?Skin:  Positive for wound.  ?All other systems reviewed and are negative. ? ?   ?Objective:  ? Physical Exam ?Vitals reviewed.  ?HENT:  ?   Head: Normocephalic.  ?Cardiovascular:  ?   Rate and Rhythm: Normal rate.  ?   Pulses:     ?     Dorsalis pedis pulses are detected w/ Doppler on the left side.  ?     Posterior tibial pulses are detected w/ Doppler on the left side.  ?Pulmonary:  ?   Effort: Pulmonary effort is normal.  ?Musculoskeletal:  ?   Right Lower  Extremity: Right leg is amputated below knee.  ?Skin: ?   General: Skin is warm and dry.  ?Neurological:  ?   Mental Status: He is alert and oriented to person, place, and time.  ?Psychiatric:     ?   Mood and Affect: Mood normal.     ?   Behavior: Behavior normal.     ?   Thought Content: Thought content normal.     ?   Judgment: Judgment normal.  ? ? ?BP 131/81 (BP Location: Right Arm)   Pulse 84   Resp 15  ? ?Past Medical History:  ?Diagnosis Date  ? Abnormal stress test   ? s/p cath November 2013 with modest disease involving the ostial left main, proximal LAD, proximal RCA - do not appear to be hemodynamically signficant; mild LV dysfunction  ? Anemia   ? low iron  ? Anxiety   ? Arthritis   ? Bacteremia   ? Chronic systolic CHF (congestive heart failure) ([place])   ? COPD (chronic obstructive pulmonary disease) ([place])   ? Coronary artery disease   ? a. per cath  report 2013, b. 09/13/2017-CABG X4 & Mitral valve repair  ? Depression   ? DVT (deep venous thrombosis) ([place])   ? arm - right- finished blood thinner- not sure when it  ? Ejection fraction < 50%   ? Erectile dysfunction   ? penile implant  ? ESRD (end stage renal disease) ([place])   ? [facility] on a MWF schedule.     ? Gout   ? Hx: of  ? Hepatitis C   ? History of seizures   ? last one 2018  ? Hyperlipidemia   ? Hypertension   ? Mitral valve disease   ? s/p Mitral repair 09/13/17  ? Obesity   ? Retinopathy   ? Shortness of breath   ? Hx: of with exertion  ? Stroke [facility])   ? "several" left side weakness, speech affected  ? Type II or unspecified type diabetes mellitus without mention of complication, not stated as uncontrolled   ? adult onset  ? ? ?Social History  ? ?Socioeconomic History  ? Marital status: Widowed  ?  Spouse name: Not on file  ? Number of children: Not on file  ? Years of education: Not on file  ? Highest education level: Not on file  ?Occupational History  ? Occupation: Health visitor  ?  Employer: DISABLED  ?  Comment: disabled  ?Tobacco Use   ? Smoking status: Former  ?  Years: 7.00  ?  Types: Cigarettes  ?  Quit date: 06/10/1973  ?  Years since quitting: 48.7  ? Smokeless tobacco: Never  ? Tobacco comments:  ?  Quit in 1974.  ?Vaping Use  ? Vaping Use: Never used  ?Substance and Sexual Activity  ? Alcohol use: No  ?  Alcohol/week: 0.0 standard drinks  ? Drug use: No  ? Sexual activity: Not on file  ?Other Topics Concern  ? Not on file  ?Social History Narrative  ? Adopted mentally retarded child at home, 2 adopted children, 3 living and 2 deceased.  ? ?Social Determinants of Health  ? ?Financial Resource Strain: Not on file  ?Food Insecurity: Not on file  ?Transportation Needs: Not on file  ?Physical Activity: Not on file  ?Stress: Not on file  ?Social Connections: Not on file  ?Intimate Partner Violence: Not on file  ? ? ?Past Surgical History:  ?Procedure Laterality Date  ? A/V FISTULAGRAM N/A 10/04/2017  ? Procedure: A/V FISTULAGRAM;  Surgeon: Elam Dutch, MD;  Location: [place] [facility];  Service: Cardiovascular;  Laterality: N/A;  ? A/V SHUNTOGRAM Left 07/12/2020  ? Procedure: A/V SHUNTOGRAM;  Surgeon: Serafina Mitchell, MD;  Location: [place] [facility];  Service: Cardiovascular;  Laterality: Left;  ? AMPUTATION Right 07/08/2021  ? Procedure: AMPUTATION ABOVE KNEE;  Surgeon: Elmore Guise, MD;  Location: [facility]S;  Service: Vascular;  Laterality: Right;  ? ANGIOPLASTY Left 09/24/2019  ? Procedure: Angioplasty Innominate Vien;  Surgeon: Serafina Mitchell, MD;  Location: [facility] OR;  Service: Vascular;  Laterality: Left;  ? AV FISTULA PLACEMENT Left 01/13/2013  ? Procedure: INSERTION OF ARTERIOVENOUS (AV) GORE-TEX GRAFT ARM;  Surgeon: Angelia Mould, MD;  Location: [place];  Service: Vascular;  Laterality: Left;  ? AV FISTULA PLACEMENT Left 05/05/2013  ? Procedure: INSERTION OF LEFT UPPER ARM  ARTERIOVENOUS GORTEX GRAFT;  Surgeon: Angelia Mould, MD;  Location: [employer];  Service: Vascular;  Laterality: Left;  ? AV FISTULA PLACEMENT Left  11/26/2019  ? Procedure: INSERTION OF ARTERIOVENOUS (AV)  GORE-TEX GRAFT, left  ARM;  Surgeon: Serafina Mitchell, MD;  Location: [facility] OR;  Service: Vascular;  Laterality: Left;  ? [place] Left 03/26/2013  ? Procedure: REMOVAL OF  NON INCORPORATED ARTERIOVENOUS GORETEX GRAFT ([place]) left arm * repair of  left brachial artery with vein patch angioplasty.;  Surgeon: Angelia Mould, MD;  Location: [place];  Service: Vascular;  Laterality: Left;  ? CARDIAC CATHETERIZATION  August 26, 2012  ? CATARACT EXTRACTION W/ INTRAOCULAR LENS  IMPLANT, BILATERAL    ? COLONOSCOPY    ? CORONARY ARTERY BYPASS GRAFT N/A 09/13/2017  ? Procedure: CORONARY ARTERY BYPASS GRAFTING (CABG) times four LIMA  to LAD, SVG sequentially to OM and RAMUS Intermediate and SVG to Acute Marginal;  Surgeon: Melrose Nakayama, MD;  Location: [place];  Service: Open Heart Surgery;  Laterality: N/A;  ? DIALYSIS/PERMA CATHETER INSERTION N/A 02/28/2021  ? Procedure: DIALYSIS/PERMA CATHETER EXCHANGE;  Surgeon: Katha Cabal, MD;  Location: [place] [facility];  Service: Cardiovascular;  Laterality: N/A;  ? EMBOLECTOMY Right 08/27/2013  ? Procedure: EMBOLECTOMY;  Surgeon: Mal Misty, MD;  Location: [place];  Service: Vascular;  Laterality: Right;  Thrombectomy of Radial and ulnar artery.  ? ENDOVASCULAR STENT INSERTION Left 09/24/2019  ? Procedure: Endovascular Stent Graft Insertion, Arm Graft;  Surgeon: Serafina Mitchell, MD;  Location: [place];  Service: Vascular;  Laterality: Left;  ? EYE SURGERY    ? laser.  and surgery for DM  ? fistula    ? RUE and wrist  ? FISTULOGRAM Left 09/24/2019  ? Procedure: SHUNTOGRAM OF ARTERIOVENOUS FISTULA LEFT ARM,;  Surgeon: Serafina Mitchell, MD;  Location: [facility];  Service: Vascular;  Laterality: Left;  ? INSERTION OF DIALYSIS CATHETER Right 01/01/2013  ? Procedure: INSERTION OF DIALYSIS CATHETER right  internal jugular;  Surgeon: Serafina Mitchell, MD;  Location: [place];  Service: Vascular;  Laterality: Right;  ?  INSERTION OF DIALYSIS CATHETER N/A 03/26/2013  ? Procedure: INSERTION OF DIALYSIS CATHETER Left internal jugular vein;  Surgeon: Angelia Mould, MD;  Location: [place];  Service: Vascular;  Laterality: N/A;  ? INSER

## 2022-03-07 NOTE — Progress Notes (Signed)
Follow-up Outpatient Visit Date: 03/08/2022  Primary Care Provider: Brayton Mars, MD [address] [place] [address]  Chief Complaint: Follow-up coronary artery disease and atrial fibrillation  HPI:  Mr. Tanney is a 76 y.o. male with history of coronary artery disease status post CABG (LIMA-LAD, sequential SVG-ramus-OM, and SVG-acute marginal) and mitral valve repair Oletta Lamas Physio II 32 mm annuloplasty ring) in 08/2017, chronic HFrEF with recovered LVEF, persistent atrial flutter, NSVT, Ilse Billman-stage renal disease, stroke complicated by seizure, PAD complicated by gangrene and sepsis leading to right AKA (06/2021), hypertension, hyperlipidemia, type 2 diabetes mellitus, and COPD, who presents for follow-up of coronary artery disease, heart failure, and atrial flutter.  I last saw him in February, at which time Mr. Bollier only complaint was of low back pain.  He was wheelchair-bound and not participating in physical therapy.  We agreed to start him on warfarin, though he only returned once for an INR check in March.  INR was supratherapeutic at that time.  Today, Mr. Montagna is without complaints today, denying chest pain, shortness of breath, palpitations, lightheadedness, and edema.  He remains wheelchair-bound.  He is not doing physical therapy.  He remains compliant with his dialysis regimen.  INR is have been followed at his nursing facility.  Last INR yesterday was 1.5; prior reading was 1.4.  Last therapeutic INR was in March.  Mr. Ishida denies bleeding.  --------------------------------------------------------------------------------------------------  Past Medical History:  Diagnosis Date   Abnormal stress test    s/p cath November 2013 with modest disease involving the ostial left main, proximal LAD, proximal RCA - do not appear to be hemodynamically signficant; mild LV dysfunction   Anemia    low iron   Anxiety    Arthritis    Bacteremia    Chronic systolic CHF  (congestive heart failure) (H[facility])    COPD (chronic obstructive pulmonary disease) ([place])    Coronary artery disease    a. per cath report 2013, b. 09/13/2017-CABG X4 & Mitral valve repair   Depression    DVT (deep venous thrombosis) ([facility])    arm - right- finished blood thinner- not sure when it   Ejection fraction < 50%    Erectile dysfunction    penile implant   ESRD (Saori Umholtz stage renal disease) ([place])    [facility] on a MWF schedule.      Gout    Hx: of   Hepatitis C    History of seizures    last one 2018   Hyperlipidemia    Hypertension    Mitral valve disease    s/p Mitral repair 09/13/17   Obesity    Retinopathy    Shortness of breath    Hx: of with exertion   Stroke [facility])    "several" left side weakness, speech affected   Type II or unspecified type diabetes mellitus without mention of complication, not stated as uncontrolled    adult onset   Past Surgical History:  Procedure Laterality Date   A/V FISTULAGRAM N/A 10/04/2017   Procedure: A/V FISTULAGRAM;  Surgeon: Elam Dutch, MD;  Location: [place] [facility];  Service: Cardiovascular;  Laterality: N/A;   A/V SHUNTOGRAM Left 07/12/2020   Procedure: A/V SHUNTOGRAM;  Surgeon: Serafina Mitchell, MD;  Location: [place] [facility];  Service: Cardiovascular;  Laterality: Left;   AMPUTATION Right 07/08/2021   Procedure: AMPUTATION ABOVE KNEE;  Surgeon: Elmore Guise, MD;  Location: [facility];  Service: Vascular;  Laterality: Right;   ANGIOPLASTY Left 09/24/2019  Procedure: Angioplasty Innominate Vien;  Surgeon: Serafina Mitchell, MD;  Location: [place];  Service: Vascular;  Laterality: Left;   AV FISTULA PLACEMENT Left 01/13/2013   Procedure: INSERTION OF ARTERIOVENOUS (AV) GORE-TEX GRAFT ARM;  Surgeon: Angelia Mould, MD;  Location: [place];  Service: Vascular;  Laterality: Left;   AV FISTULA PLACEMENT Left 05/05/2013   Procedure: INSERTION OF LEFT UPPER ARM  ARTERIOVENOUS GORTEX GRAFT;  Surgeon: Angelia Mould, MD;   Location: [place];  Service: Vascular;  Laterality: Left;   AV FISTULA PLACEMENT Left 11/26/2019   Procedure: INSERTION OF ARTERIOVENOUS (AV) GORE-TEX GRAFT, left  ARM;  Surgeon: Serafina Mitchell, MD;  Location: [place];  Service: Vascular;  Laterality: Left;   [place] REMOVAL Left 03/26/2013   Procedure: REMOVAL OF  NON INCORPORATED ARTERIOVENOUS GORETEX GRAFT ([place]) left arm * repair of  left brachial artery with vein patch angioplasty.;  Surgeon: Angelia Mould, MD;  Location: [place];  Service: Vascular;  Laterality: Left;   CARDIAC CATHETERIZATION  August 26, 2012   CATARACT EXTRACTION W/ INTRAOCULAR LENS  IMPLANT, BILATERAL     COLONOSCOPY     CORONARY ARTERY BYPASS GRAFT N/A 09/13/2017   Procedure: CORONARY ARTERY BYPASS GRAFTING (CABG) times four LIMA  to LAD, SVG sequentially to OM and RAMUS Intermediate and SVG to Acute Marginal;  Surgeon: Melrose Nakayama, MD;  Location: [place];  Service: Open Heart Surgery;  Laterality: N/A;   DIALYSIS/PERMA CATHETER INSERTION N/A 02/28/2021   Procedure: DIALYSIS/PERMA CATHETER EXCHANGE;  Surgeon: Katha Cabal, MD;  Location: [place] [facility];  Service: Cardiovascular;  Laterality: N/A;   EMBOLECTOMY Right 08/27/2013   Procedure: EMBOLECTOMY;  Surgeon: Mal Misty, MD;  Location: [place];  Service: Vascular;  Laterality: Right;  Thrombectomy of Radial and ulnar artery.   ENDOVASCULAR STENT INSERTION Left 09/24/2019   Procedure: Endovascular Stent Graft Insertion, Arm Graft;  Surgeon: Serafina Mitchell, MD;  Location: [facility] OR;  Service: Vascular;  Laterality: Left;   EYE SURGERY     laser.  and surgery for DM   fistula     RUE and wrist   FISTULOGRAM Left 09/24/2019   Procedure: SHUNTOGRAM OF ARTERIOVENOUS FISTULA LEFT ARM,;  Surgeon: Serafina Mitchell, MD;  Location: [place];  Service: Vascular;  Laterality: Left;   INSERTION OF DIALYSIS CATHETER Right 01/01/2013   Procedure: INSERTION OF DIALYSIS CATHETER right  internal jugular;  Surgeon:  Serafina Mitchell, MD;  Location: [place];  Service: Vascular;  Laterality: Right;   INSERTION OF DIALYSIS CATHETER N/A 03/26/2013   Procedure: INSERTION OF DIALYSIS CATHETER Left internal jugular vein;  Surgeon: Angelia Mould, MD;  Location: [place];  Service: Vascular;  Laterality: N/A;   INSERTION OF DIALYSIS CATHETER Right 11/26/2019   Procedure: INSERTION OF DIALYSIS CATHETER, right internal jugular;  Surgeon: Serafina Mitchell, MD;  Location: [place];  Service: Vascular;  Laterality: Right;   IR THORACENTESIS ASP PLEURAL SPACE W/IMG GUIDE  09/30/2019   LEFT HEART CATH AND CORONARY ANGIOGRAPHY N/A 09/09/2017   Procedure: LEFT HEART CATH AND CORONARY ANGIOGRAPHY;  Surgeon: Troy Sine, MD;  Location: [place] [facility];  Service: Cardiovascular;  Laterality: N/A;   LIGATION OF ARTERIOVENOUS  FISTULA Right 12/30/2013   Procedure: REMOVAL OF SEGMENT OF GORTEX GRAFT AND FISTULA  AND REPAIR OF BRACHIAL ARTERY;  Surgeon: Mal Misty, MD;  Location: [place];  Service: Vascular;  Laterality: Right;   LOWER EXTREMITY ANGIOGRAPHY N/A 06/28/2021  Procedure: Lower Extremity Angiography;  Surgeon: Katha Cabal, MD;  Location: [place] [facility];  Service: Cardiovascular;  Laterality: N/A;   LOWER EXTREMITY ANGIOGRAPHY Left 11/23/2021   Procedure: LOWER EXTREMITY ANGIOGRAPHY;  Surgeon: Algernon Huxley, MD;  Location: [place] [facility];  Service: Cardiovascular;  Laterality: Left;   MITRAL VALVE REPAIR N/A 09/13/2017   Procedure: MITRAL VALVE REPAIR (MVR);  Surgeon: Melrose Nakayama, MD;  Location: [place];  Service: Open Heart Surgery;  Laterality: N/A;   MITRAL VALVE REPAIR (MV)/CORONARY ARTERY BYPASS GRAFTING (CABG)  09/13/2017   PENILE PROSTHESIS IMPLANT     1997.. no card   PERIPHERAL VASCULAR BALLOON ANGIOPLASTY Left 10/04/2017   Procedure: PERIPHERAL VASCULAR BALLOON ANGIOPLASTY;  Surgeon: Elam Dutch, MD;  Location: [place] [facility];  Service: Cardiovascular;  Laterality: Left;   AVF   PERIPHERAL VASCULAR BALLOON ANGIOPLASTY Left 07/12/2020   Procedure: PERIPHERAL VASCULAR BALLOON ANGIOPLASTY;  Surgeon: Serafina Mitchell, MD;  Location: [place] [facility];  Service: Cardiovascular;  Laterality: Left;  ARM SHUNTOGRAM   REVISION OF ARTERIOVENOUS GORETEX GRAFT Left 09/24/2019   Procedure: REVISION OF ARTERIOVENOUS GORETEX GRAFT LEFT ARM;  Surgeon: Serafina Mitchell, MD;  Location: [facility] OR;  Service: Vascular;  Laterality: Left;   TEE WITHOUT CARDIOVERSION N/A 06/11/2013   Procedure: TRANSESOPHAGEAL ECHOCARDIOGRAM (TEE);  Surgeon: Larey Dresser, MD;  Location: [facility] ENDOSCOPY;  Service: Cardiovascular;  Laterality: N/A;   TEE WITHOUT CARDIOVERSION N/A 09/13/2017   Procedure: TRANSESOPHAGEAL ECHOCARDIOGRAM (TEE);  Surgeon: Melrose Nakayama, MD;  Location: [place];  Service: Open Heart Surgery;  Laterality: N/A;   THROMBECTOMY W/ EMBOLECTOMY Left 03/26/2013   Procedure: Attempted thrombectomy of left arm arteriovenous goretex graft.;  Surgeon: Angelia Mould, MD;  Location: [facility] OR;  Service: Vascular;  Laterality: Left;   ULTRASOUND GUIDANCE FOR VASCULAR ACCESS Bilateral 09/24/2019   Procedure: Ultrasound Guidance For Vascular Access, Right Femoral Vein and Left Arm Graft;  Surgeon: Serafina Mitchell, MD;  Location: [facility] OR;  Service: Vascular;  Laterality: Bilateral;   VENOGRAM N/A 04/07/2013   Procedure: VENOGRAM;  Surgeon: Serafina Mitchell, MD;  Location: [facility] CATH LAB;  Service: Cardiovascular;  Laterality: N/A;    Current Meds  Medication Sig   acetaminophen (TYLENOL) 500 MG tablet Take 1,000 mg by mouth every 6 (six) hours as needed for mild pain.   amLODipine (NORVASC) 5 MG tablet Take 1 tablet (5 mg total) by mouth daily.   aspirin EC 81 MG tablet Take 1 tablet (81 mg total) by mouth daily.   atorvastatin (LIPITOR) 40 MG tablet Take 40 mg by mouth at bedtime.   clopidogrel (PLAVIX) 75 MG tablet Take 1 tablet (75 mg total) by mouth daily.   DULoxetine (CYMBALTA) 20 MG  capsule Take 40 mg by mouth 2 (two) times daily.   levETIRAcetam (KEPPRA) 500 MG tablet Take 1 tablet (500 mg total) by mouth 2 (two) times daily. Only on Tuesday, Thursday, Saturday, Sunday   levETIRAcetam (KEPPRA) 500 MG tablet Take 1 tablet (500 mg total) by mouth 3 (three) times daily. On MWF - administer lunchtime dose once returns from dialysis   multivitamin (RENA-VIT) TABS tablet Take 1 tablet by mouth at bedtime.   pantoprazole (PROTONIX) 40 MG tablet Take 1 tablet (40 mg total) by mouth daily. (Patient taking differently: Take 40 mg by mouth at bedtime.)   polyethylene glycol (MIRALAX / GLYCOLAX) 17 g packet Take 17 g by mouth daily. Dissolve in 4-8 ounces of water  senna-docusate (SENOKOT-S) 8.6-50 MG tablet Take 1 tablet by mouth at bedtime.   sevelamer carbonate (RENVELA) 800 MG tablet Take 800 mg by mouth 3 (three) times daily with meals. On Monday, Wednesday, and Friday. Give NOON dose on MWF asap when returning from dialysis   warfarin (COUMADIN) 5 MG tablet Take 1 tablet (5 mg total) by mouth daily. (Patient taking differently: Take 3 mg by mouth every Monday, Wednesday, and Friday. 3 mg tablet every Monday, Wednesday, Friday)    Allergies: Patient has no known allergies.  Social History   Tobacco Use   Smoking status: Former    Years: 7.00    Types: Cigarettes    Quit date: 06/10/1973    Years since quitting: 48.7   Smokeless tobacco: Never   Tobacco comments:    Quit in 1974.  Vaping Use   Vaping Use: Never used  Substance Use Topics   Alcohol use: No    Alcohol/week: 0.0 standard drinks   Drug use: No    Family History  Problem Relation Age of Onset   Heart disease Father    Diabetes Sister    Diabetes Brother    Diabetes Son     Review of Systems: A 12-system review of systems was performed and was negative except as noted in the HPI.  --------------------------------------------------------------------------------------------------  Physical  Exam: BP 110/60 (BP Location: Left Arm, Patient Position: Sitting, Cuff Size: Normal)   Pulse 99   Ht 5\' 8"  (1.727 m)   Wt 147 lb (66.7 kg) Comment: recorded from LTC facility  SpO2 100%   BMI 22.35 kg/m   General:  NAD.  Seated in a wheelchair. Neck: No JVD or HJR. Lungs: Mildly diminished breath sounds throughout with poor inspiratory effort.  No wheezes or crackles. Heart: Regular rate and rhythm with frequent extrasystoles.  1/6 systolic murmur. Abdomen: Soft, nontender, nondistended. Extremities: No left lower extremity edema.  Patient is status post right AKA.  EKG: Sinus rhythm with PACs and PVCs, left anterior fascicular block, nonspecific T wave changes, and mild QT prolongation.  Compared with prior tracing from 12/26/2021, heart rate has increased.  PACs and PVCs are now present.  Lab Results  Component Value Date   WBC 7.6 12/26/2021   HGB 9.1 (L) 12/26/2021   HCT 28.8 (L) 12/26/2021   MCV 87.0 12/26/2021   PLT 249 12/26/2021    Lab Results  Component Value Date   NA 138 12/26/2021   K 3.9 12/26/2021   CL 97 (L) 12/26/2021   CO2 32 12/26/2021   BUN 58 (H) 12/26/2021   CREATININE 7.45 (H) 12/26/2021   GLUCOSE 181 (H) 12/26/2021   ALT 49 (H) 09/25/2021    Lab Results  Component Value Date   CHOL 122 09/26/2021   HDL 45 09/26/2021   LDLCALC 67 09/26/2021   TRIG 48 09/26/2021   CHOLHDL 2.7 09/26/2021    --------------------------------------------------------------------------------------------------  ASSESSMENT AND PLAN: Persistent atrial flutter and history of stroke: Mr. Nevares is maintaining sinus rhythm.  Given his CHA2DS2-VASc score of at least 7, anticoagulation remains indicated.  He has been on warfarin, with INR is being followed/managed through his nursing home.  He has not had a therapeutic INR for at least 2 months.  His only INR check with [place] in March was supratherapeutic.  Though not ideal in the setting of his comorbidities, I think the  safest option would be to switch from warfarin to apixaban 5 mg twice daily in order to ensure that he is  therapeutically anticoagulated while minimizing his risk of bleed.  I have discussed this with Mr. Defina, and he is in agreement.  Continue ongoing follow-up with neurology.  Coronary artery disease: No angina reported.  Continue secondary prevention with atorvastatin.  We will use apixaban and lieu of aspirin given aforementioned atrial flutter.  I will also discontinue clopidogrel, which has been continued following left lower extremity PTA in February.  Peripheral arterial disease: Continue follow-up with vascular surgery.  As above, we will discontinue clopidogrel with plans to utilize apixaban alone following PTA to the left lower extremity 3.5 months ago in order to minimize bleeding risk.  Continue statin therapy for target LDL less than 70.  Chronic HFrEF with recovered ejection fraction: Mr. Stith appears euvolemic.  Assessment of functional status is challenging as he is essentially wheelchair-bound.  We will defer adding GDMT at this time given normalized LVEF on last assessment as well as low normal blood pressure.  Dayleen Beske-stage renal disease: Per nephrology.  Hypertension: Blood pressure well-controlled.  Continue amlodipine.  Follow-up: Return to clinic in 4-6 weeks with APP.  Nelva Bush, MD 03/08/2022 9:57 AM

## 2022-03-08 ENCOUNTER — Ambulatory Visit (INDEPENDENT_AMBULATORY_CARE_PROVIDER_SITE_OTHER): Payer: Medicare (Managed Care) | Admitting: Internal Medicine

## 2022-03-08 ENCOUNTER — Encounter: Payer: Self-pay | Admitting: Internal Medicine

## 2022-03-08 VITALS — BP 110/60 | HR 99 | Ht 68.0 in | Wt 147.0 lb

## 2022-03-08 DIAGNOSIS — I2510 Atherosclerotic heart disease of native coronary artery without angina pectoris: Secondary | ICD-10-CM | POA: Diagnosis not present

## 2022-03-08 DIAGNOSIS — I10 Essential (primary) hypertension: Secondary | ICD-10-CM

## 2022-03-08 DIAGNOSIS — Z8673 Personal history of transient ischemic attack (TIA), and cerebral infarction without residual deficits: Secondary | ICD-10-CM | POA: Diagnosis not present

## 2022-03-08 DIAGNOSIS — I5022 Chronic systolic (congestive) heart failure: Secondary | ICD-10-CM | POA: Diagnosis not present

## 2022-03-08 DIAGNOSIS — I4892 Unspecified atrial flutter: Secondary | ICD-10-CM

## 2022-03-08 DIAGNOSIS — I739 Peripheral vascular disease, unspecified: Secondary | ICD-10-CM

## 2022-03-08 DIAGNOSIS — N186 End stage renal disease: Secondary | ICD-10-CM

## 2022-03-08 MED ORDER — APIXABAN 5 MG PO TABS: 5.0000 mg | ORAL_TABLET | Freq: Two times a day (BID) | ORAL | 1 refills | Status: DC

## 2022-03-08 NOTE — Patient Instructions (Signed)
Medication Instructions:   Your physician has recommended you make the following change in your medication:   STOP Warfarin  STOP Plavix (clopidogrel)  START Eliquis (apixaban) 5 mg TWICE daily   *If you need a refill on your cardiac medications before your next appointment, please call your pharmacy*   Lab Work:  None ordered  Testing/Procedures:  None ordered   Follow-Up: At [facility], you and your health needs are our priority.  As part of our continuing mission to provide you with exceptional heart care, we have created designated Provider Care Teams.  These Care Teams include your primary Cardiologist (physician) and Advanced Practice Providers (APPs -  Physician Assistants and Nurse Practitioners) who all work together to provide you with the care you need, when you need it.  We recommend signing up for the patient portal called "MyChart".  Sign up information is provided on this After Visit Summary.  MyChart is used to connect with patients for Virtual Visits (Telemedicine).  Patients are able to view lab/test results, encounter notes, upcoming appointments, etc.  Non-urgent messages can be sent to your provider as well.   To learn more about what you can do with MyChart, go to [email or web address].    Your next appointment:    4 - 6 week(s) w/ APP  The format for your next appointment:   In Person  Provider:   You will see one of the following Advanced Practice Providers on your designated Care Team:   Murray Hodgkins, NP Christell Faith, [name] Cadence Kathlen Mody, [place]   Important Information About Sugar

## 2022-03-09 ENCOUNTER — Encounter: Payer: Self-pay | Admitting: Internal Medicine

## 2022-04-10 NOTE — Progress Notes (Unsigned)
Office Visit    Patient Name: Ernest [name] Date of Encounter: 04/12/2022  Primary Care Provider:  Brayton Mars, MD Primary Cardiologist:  Nelva Bush, MD Primary Electrophysiologist: None  Chief Complaint    IZIC STFORT is a 76 y.o. 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  Past Medical History    Past Medical History:  Diagnosis Date   Abnormal stress test    s/p cath November 2013 with modest disease involving the ostial left main, proximal LAD, proximal RCA - do not appear to be hemodynamically signficant; mild LV dysfunction   Anemia    low iron   Anxiety    Arthritis    Bacteremia    Chronic systolic CHF (congestive heart failure) (HCC)    COPD (chronic obstructive pulmonary disease) ([place])    Coronary artery disease    a. per cath report 2013, b. 09/13/2017-CABG X4 & Mitral valve repair   Depression    DVT (deep venous thrombosis) (HCC)    arm - right- finished blood thinner- not sure when it   Ejection fraction < 50%    Erectile dysfunction    penile implant   ESRD (end stage renal disease) ([place])    [facility] on a MWF schedule.      Gout    Hx: of   Hepatitis C    History of seizures    last one 2018   Hyperlipidemia    Hypertension    Mitral valve disease    s/p Mitral repair 09/13/17   Obesity    Retinopathy    Shortness of breath    Hx: of with exertion   Stroke [facility])    "several" left side weakness, speech affected   Type II or unspecified type diabetes mellitus without mention of complication, not stated as uncontrolled    adult onset   Past Surgical History:  Procedure  Laterality Date   A/V FISTULAGRAM N/A 10/04/2017   Procedure: A/V FISTULAGRAM;  Surgeon: Elam Dutch, MD;  Location: [place] CV LAB;  Service: Cardiovascular;  Laterality: N/A;   A/V SHUNTOGRAM Left 07/12/2020   Procedure: A/V SHUNTOGRAM;  Surgeon: Serafina Mitchell, MD;  Location: [facility] LAB;  Service: Cardiovascular;  Laterality: Left;   AMPUTATION Right 07/08/2021   Procedure: AMPUTATION ABOVE KNEE;  Surgeon: Elmore Guise, MD;  Location: [facility] ORS;  Service: Vascular;  Laterality: Right;   ANGIOPLASTY Left 09/24/2019   Procedure: Angioplasty Innominate Vien;  Surgeon: Serafina Mitchell, MD;  Location: [place];  Service: Vascular;  Laterality: Left;   AV FISTULA PLACEMENT Left 01/13/2013   Procedure: INSERTION OF ARTERIOVENOUS (AV) GORE-TEX GRAFT ARM;  Surgeon: Angelia Mould, MD;  Location: [place];  Service: Vascular;  Laterality: Left;   AV FISTULA PLACEMENT Left 05/05/2013   Procedure: INSERTION OF LEFT UPPER ARM  ARTERIOVENOUS GORTEX GRAFT;  Surgeon: Angelia Mould, MD;  Location: [facility] OR;  Service: Vascular;  Laterality: Left;   AV FISTULA PLACEMENT Left 11/26/2019   Procedure: INSERTION OF ARTERIOVENOUS (AV) GORE-TEX GRAFT, left  ARM;  Surgeon: Serafina Mitchell, MD;  Location: [place];  Service: Vascular;  Laterality: Left;   [place] Left 03/26/2013   Procedure: REMOVAL OF  NON INCORPORATED ARTERIOVENOUS GORETEX GRAFT ([place]) left arm * repair of  left brachial artery with vein patch angioplasty.;  Surgeon: Angelia Mould, MD;  Location: [place];  Service: Vascular;  Laterality: Left;   CARDIAC CATHETERIZATION  August 26, 2012   CATARACT EXTRACTION W/ INTRAOCULAR LENS  IMPLANT, BILATERAL     COLONOSCOPY     CORONARY ARTERY BYPASS GRAFT N/A 09/13/2017   Procedure: CORONARY ARTERY BYPASS GRAFTING (CABG) times four LIMA  to LAD, SVG sequentially to OM and RAMUS Intermediate and SVG to Acute Marginal;  Surgeon: Melrose Nakayama, MD;  Location: [place];   Service: Open Heart Surgery;  Laterality: N/A;   DIALYSIS/PERMA CATHETER INSERTION N/A 02/28/2021   Procedure: DIALYSIS/PERMA CATHETER EXCHANGE;  Surgeon: Katha Cabal, MD;  Location: [place] CV LAB;  Service: Cardiovascular;  Laterality: N/A;   EMBOLECTOMY Right 08/27/2013   Procedure: EMBOLECTOMY;  Surgeon: Mal Misty, MD;  Location: [place];  Service: Vascular;  Laterality: Right;  Thrombectomy of Radial and ulnar artery.   ENDOVASCULAR STENT INSERTION Left 09/24/2019   Procedure: Endovascular Stent Graft Insertion, Arm Graft;  Surgeon: Serafina Mitchell, MD;  Location[facility] OR;  Service: Vascular;  Laterality: Left;   EYE SURGERY     laser.  and surgery for DM   fistula     RUE and wrist   FISTULOGRAM Left 09/24/2019   Procedure: SHUNTOGRAM OF ARTERIOVENOUS FISTULA LEFT ARM,;  Surgeon: Serafina Mitchell, MD;  Location: [place];  Service: Vascular;  Laterality: Left;   INSERTION OF DIALYSIS CATHETER Right 01/01/2013   Procedure: INSERTION OF DIALYSIS CATHETER right  internal jugular;  Surgeon: Serafina Mitchell, MD;  Location: [place];  Service: Vascular;  Laterality: Right;   INSERTION OF DIALYSIS CATHETER N/A 03/26/2013   Procedure: INSERTION OF DIALYSIS CATHETER Left internal jugular vein;  Surgeon: Angelia Mould, MD;  Location: [place];  Service: Vascular;  Laterality: N/A;   INSERTION OF DIALYSIS CATHETER Right 11/26/2019   Procedure: INSERTION OF DIALYSIS CATHETER, right internal jugular;  Surgeon: Serafina Mitchell, MD;  Location: [place];  Service: Vascular;  Laterality: Right;   IR THORACENTESIS ASP PLEURAL SPACE W/IMG GUIDE  09/30/2019   LEFT HEART CATH AND CORONARY ANGIOGRAPHY N/A 09/09/2017   Procedure: LEFT HEART CATH AND CORONARY ANGIOGRAPHY;  Surgeon: Troy Sine, MD;  Location: [place] CV LAB;  Service: Cardiovascular;  Laterality: N/A;   LIGATION OF ARTERIOVENOUS  FISTULA Right 12/30/2013   Procedure: REMOVAL OF SEGMENT OF GORTEX GRAFT AND FISTULA  AND REPAIR OF  BRACHIAL ARTERY;  Surgeon: Mal Misty, MD;  Location: [facility];  Service: Vascular;  Laterality: Right;   LOWER EXTREMITY ANGIOGRAPHY N/A 06/28/2021   Procedure: Lower Extremity Angiography;  Surgeon: Katha Cabal, MD;  Location: [place] [facility];  Service: Cardiovascular;  Laterality: N/A;   LOWER EXTREMITY ANGIOGRAPHY Left 11/23/2021   Procedure: LOWER EXTREMITY ANGIOGRAPHY;  Surgeon: Algernon Huxley, MD;  Location: [place] CV LAB;  Service: Cardiovascular;  Laterality: Left;   MITRAL VALVE REPAIR N/A 09/13/2017   Procedure: MITRAL VALVE REPAIR (MVR);  Surgeon: Melrose Nakayama, MD;  Location: [place];  Service: Open Heart Surgery;  Laterality: N/A;   MITRAL VALVE REPAIR (MV)/CORONARY ARTERY BYPASS GRAFTING (CABG)  09/13/2017   PENILE PROSTHESIS IMPLANT     1997.. no card   PERIPHERAL VASCULAR BALLOON ANGIOPLASTY Left 10/04/2017   Procedure:  PERIPHERAL VASCULAR BALLOON ANGIOPLASTY;  Surgeon: Elam Dutch, MD;  Location: [place] CV LAB;  Service: Cardiovascular;  Laterality: Left;  AVF   PERIPHERAL VASCULAR BALLOON ANGIOPLASTY Left 07/12/2020   Procedure: PERIPHERAL VASCULAR BALLOON ANGIOPLASTY;  Surgeon: Serafina Mitchell, MD;  Location: [place] CV LAB;  Service: Cardiovascular;  Laterality: Left;  ARM SHUNTOGRAM   REVISION OF ARTERIOVENOUS GORETEX GRAFT Left 09/24/2019   Procedure: REVISION OF ARTERIOVENOUS GORETEX GRAFT LEFT ARM;  Surgeon: Serafina Mitchell, MD;  Location: [facility] OR;  Service: Vascular;  Laterality: Left;   TEE WITHOUT CARDIOVERSION N/A 06/11/2013   Procedure: TRANSESOPHAGEAL ECHOCARDIOGRAM (TEE);  Surgeon: Larey Dresser, MD;  Location: [facility] ENDOSCOPY;  Service: Cardiovascular;  Laterality: N/A;   TEE WITHOUT CARDIOVERSION N/A 09/13/2017   Procedure: TRANSESOPHAGEAL ECHOCARDIOGRAM (TEE);  Surgeon: Melrose Nakayama, MD;  Location: [place];  Service: Open Heart Surgery;  Laterality: N/A;   THROMBECTOMY W/ EMBOLECTOMY Left 03/26/2013   Procedure: Attempted  thrombectomy of left arm arteriovenous goretex graft.;  Surgeon: Angelia Mould, MD;  Location: [facility] OR;  Service: Vascular;  Laterality: Left;   ULTRASOUND GUIDANCE FOR VASCULAR ACCESS Bilateral 09/24/2019   Procedure: Ultrasound Guidance For Vascular Access, Right Femoral Vein and Left Arm Graft;  Surgeon: Serafina Mitchell, MD;  Location: [facility] OR;  Service: Vascular;  Laterality: Bilateral;   VENOGRAM N/A 04/07/2013   Procedure: VENOGRAM;  Surgeon: Serafina Mitchell, MD;  Location: [facility] CATH LAB;  Service: Cardiovascular;  Laterality: N/A;    Allergies  [name]  History of Present Illness    Ernest [name] is a 76 year old male with the above-mentioned past medical history who presents today for 4-week follow-up of congestive heart failure and coronary artery disease.  Mr. Deese underwent LHC in 08/2017 following worsening shortness of breath that demonstrated multivessel CAD with severe MR.  CABG x4 with mitral valve repair was completed on 09/13/2017.  He developed atrial flutter postop and converted to sinus rhythm and was initiated on Coumadin.  He was back in atrial flutter at discharge on amiodarone and Coumadin.  He was placed in SNF with plan to undergo DCCV however he left AMA before being cardioverted.  Coumadin was discontinued due to noncompliance and was found not to be a candidate for DOAC with prior MV repair requiring warfarin.   He was lost to follow-up until hospitalization in 06/2021 with gangrene and osteomyelitis complicated by sepsis.  He underwent right AKA.  2D echo during admission with EF of 50-55% with moderate LVH, grade 2 DD and elevated LA pressure, dilated aortic root measuring 38 mm.  He was admitted to the hospital on 09/2021 for suspected TIA.  MRI and EEG of the brain were negative. Carotid artery ultrasound showed no significant bilateral stenosis.  EKG shows sinus rhythm and high-sensitivity troponins were 28 with a BNP of 1494.  Patient had discussed  with internal medicine that he wished to discontinue dialysis leading to palliative care consultation.  Prior to discharge patient agreed to resume dialysis and there were also concerns for delirium related to hypoxia.  He was seen on 10/2021 to reestablish care at the [facility] office with Christell Faith, PA.  During visit patient reported doing well from cardiac perspective.  Coumadin was reinitiated with plan to establish regular and safe INR checks.  He was seen in follow-up 1 month later by Dr. Saunders Revel and patient inquired about having regular INR checks at the office.  He was euvolemic on examination and volume  management had been done by nephrology.  GDMT was not titrated at that time due to normal LVEF.  There was also conversation regarding initiation of DOAC and discontinuation of warfarin.  Due to 2 months of subtherapeutic INR.  He was seen most recently on 03/08/2022 and warfarin was discontinued and patient was placed on Eliquis 5 mg twice daily as the safest alternative.  Clopidogrel and ASA were also discontinued.   Since last being seen in the office patient reports that he is tolerating his Eliquis and denies any bleeding.  He presents for follow-up alone today.  He is euvolemic on examination today and is tolerating his current dialysis treatments.  Patient denies chest pain, palpitations, dyspnea, PND, orthopnea, nausea, vomiting, dizziness, syncope, edema, weight gain, or early satiety.  Home Medications    Current Outpatient Medications  Medication Sig Dispense Refill   acetaminophen (TYLENOL) 500 MG tablet Take 1,000 mg by mouth every 6 (six) hours as needed for mild pain.     amLODipine (NORVASC) 5 MG tablet Take 1 tablet (5 mg total) by mouth daily. 30 tablet 3   apixaban (ELIQUIS) 5 MG TABS tablet Take 1 tablet (5 mg total) by mouth 2 (two) times daily. 60 tablet 1   atorvastatin (LIPITOR) 40 MG tablet Take 40 mg by mouth at bedtime.     DULoxetine (CYMBALTA) 20 MG capsule Take 40 mg  by mouth 2 (two) times daily.     levETIRAcetam (KEPPRA) 500 MG tablet Take 1 tablet (500 mg total) by mouth 2 (two) times daily. Only on Tuesday, Thursday, Saturday, Sunday 32 tablet 5   levETIRAcetam (KEPPRA) 500 MG tablet Take 1 tablet (500 mg total) by mouth 3 (three) times daily. On MWF - administer lunchtime dose once returns from dialysis 36 tablet 5   multivitamin (RENA-VIT) TABS tablet Take 1 tablet by mouth at bedtime. 30 tablet 0   pantoprazole (PROTONIX) 40 MG tablet Take 1 tablet (40 mg total) by mouth daily. (Patient taking differently: Take 40 mg by mouth at bedtime.) 30 tablet 0   polyethylene glycol (MIRALAX / GLYCOLAX) 17 g packet Take 17 g by mouth daily. Dissolve in 4-8 ounces of water     senna-docusate (SENOKOT-S) 8.6-50 MG tablet Take 1 tablet by mouth at bedtime.     sevelamer carbonate (RENVELA) 800 MG tablet Take 800 mg by mouth 3 (three) times daily with meals. On Monday, Wednesday, and Friday. Give NOON dose on MWF asap when returning from dialysis     No current facility-administered medications for this visit.     Review of Systems  Please see the history of present illness.    (+) Low back pain  All other systems reviewed and are otherwise negative except as noted above.  Physical Exam    Wt Readings from Last 3 Encounters:  04/12/22 146 lb (66.2 kg)  03/08/22 147 lb (66.7 kg)  12/26/21 160 lb 15 oz (73 kg)   VS: Vitals:   04/12/22 0818  BP: 120/70  Pulse: (!) 101  SpO2: 96%  ,Body mass index is 22.2 kg/m.  Constitutional:      Appearance: In a wheelchair  not in distress.  Neck:     Vascular: JVD normal.  Pulmonary:     Effort: Pulmonary effort is normal.     Breath sounds: No wheezing. No rales. Diminished in the bases Cardiovascular:     Normal rate. Regular rate and occasional systolic beat normal S1. Normal S2.  Murmurs: Faint systolic murmur 2/6 Edema:    Patient has right AKA, no edema in left lower extremity Abdominal:      Palpations: Abdomen is soft non tender. There is no hepatomegaly.  Skin:    General: Skin is warm and dry.  Neurological:     General: No focal deficit present.     Mental Status: Alert and oriented to person, place and time.     Cranial Nerves: Cranial nerves are intact.  EKG/LABS/Other Studies Reviewed    ECG personally reviewed by me today -sinus tachycardia with rate of 101 and occasional PVCs with left anterior fascicular block and no acute changes compared to EKG 4 weeks ago.  Risk Assessment/Calculations:    CHA2DS2-VASc Score = 7   This indicates a 11.2% annual risk of stroke. The patient's score is based upon: CHF History: 1 HTN History: 1 Diabetes History: 1 Stroke History: 2 Vascular Disease History: 1 Age Score: 1 Gender Score: 0           Lab Results  Component Value Date   WBC 7.6 12/26/2021   HGB 9.1 (L) 12/26/2021   HCT 28.8 (L) 12/26/2021   MCV 87.0 12/26/2021   PLT 249 12/26/2021   Lab Results  Component Value Date   CREATININE 7.45 (H) 12/26/2021   BUN 58 (H) 12/26/2021   NA 138 12/26/2021   K 3.9 12/26/2021   CL 97 (L) 12/26/2021   CO2 32 12/26/2021   Lab Results  Component Value Date   ALT 49 (H) 09/25/2021   AST 50 (H) 09/25/2021   ALKPHOS 84 09/25/2021   BILITOT 0.7 09/25/2021   Lab Results  Component Value Date   CHOL 122 09/26/2021   HDL 45 09/26/2021   LDLCALC 67 09/26/2021   TRIG 48 09/26/2021   CHOLHDL 2.7 09/26/2021    Lab Results  Component Value Date   HGBA1C 7.0 (H) 09/26/2021    Assessment & Plan    1.  Coronary artery disease: -Patient denies any anginal complaints today.  In reviewing his paperwork from his nursing home he was still taking 81 mg ASA.  Paperwork was updated to discontinue ASA from patient's home medications. -Continue GDMT with atorvastatin 40 mg  2.  Chronic combined systolic and diastolic CHF: -Fluid volume status currently managed by dialysis.  Euvolemic on examination today. -GDMT deferred  based on volume status and normal LV function on last 2D echo  3.  Atrial flutter: -Patient is maintaining sinus rhythm today and reports no bleeding events with Eliquis -Continue Eliquis 5 mg twice daily -CHA2DS2-VASc Score = 7 [CHF History: 1, HTN History: 1, Diabetes History: 1, Stroke History: 2, Vascular Disease History: 1, Age Score: 1, Gender Score: 0].  Therefore, the patient's annual risk of stroke is 11.2 %.      4.  Mitral valve disease: - s/p repair with CABG in 2018 -Valve function was stable per echo in 06/2021 -Continue blood pressure control with prescribed therapeutic regimen  5.  End-stage renal disease: -Followed by nephrology -Current dialysis schedule of M/W/FR  6.  History of CVA/TIA: -Follow-up per neurology -Continue apixaban as mentioned above  7.  HTN: -Well-controlled today at 120/70 -Continue amlodipine 5 mg daily  8.  PAD: -Currently followed by vascular surgery -Continue atorvastatin and Eliquis as mentioned above   Disposition: Follow-up with Nelva Bush, MD or APP in 4 months  Medication Adjustments/Labs and Tests Ordered: Current medicines are reviewed at length with the patient today.  Concerns regarding  medicines are outlined above.   Signed, Mable Fill, Marissa Nestle, NP 04/12/2022, 9:14 AM [place]

## 2022-04-12 ENCOUNTER — Ambulatory Visit (INDEPENDENT_AMBULATORY_CARE_PROVIDER_SITE_OTHER): Payer: Medicare (Managed Care) | Admitting: Nurse Practitioner

## 2022-04-12 ENCOUNTER — Encounter: Payer: Self-pay | Admitting: Nurse Practitioner

## 2022-04-12 VITALS — BP 120/70 | HR 101 | Ht 68.0 in | Wt 146.0 lb

## 2022-04-12 DIAGNOSIS — I739 Peripheral vascular disease, unspecified: Secondary | ICD-10-CM

## 2022-04-12 DIAGNOSIS — I4892 Unspecified atrial flutter: Secondary | ICD-10-CM

## 2022-04-12 DIAGNOSIS — G459 Transient cerebral ischemic attack, unspecified: Secondary | ICD-10-CM

## 2022-04-12 DIAGNOSIS — I10 Essential (primary) hypertension: Secondary | ICD-10-CM

## 2022-04-12 DIAGNOSIS — Z8673 Personal history of transient ischemic attack (TIA), and cerebral infarction without residual deficits: Secondary | ICD-10-CM

## 2022-04-12 DIAGNOSIS — I2510 Atherosclerotic heart disease of native coronary artery without angina pectoris: Secondary | ICD-10-CM

## 2022-04-12 DIAGNOSIS — N186 End stage renal disease: Secondary | ICD-10-CM

## 2022-04-12 DIAGNOSIS — I5022 Chronic systolic (congestive) heart failure: Secondary | ICD-10-CM

## 2022-04-12 NOTE — Patient Instructions (Addendum)
Medication Instructions:  Your physician has recommended you make the following change in your medication:   STOP Aspirin  *If you need a refill on your cardiac medications before your next appointment, please call your pharmacy*   Lab Work: None  If you have labs (blood work) drawn today and your tests are completely normal, you will receive your results only by: [place] (if you have MyChart) OR A paper copy in the mail If you have any lab test that is abnormal or we need to change your treatment, we will call you to review the results.   Testing/Procedures: None   Follow-Up: At [facility], you and your health needs are our priority.  As part of our continuing mission to provide you with exceptional heart care, we have created designated Provider Care Teams.  These Care Teams include your primary Cardiologist (physician) and Advanced Practice Providers (APPs -  Physician Assistants and Nurse Practitioners) who all work together to provide you with the care you need, when you need it.  Your next appointment:   4 month(s)  The format for your next appointment:   In Person  Provider:   Nelva Bush, MD or Murray Hodgkins, NP      Important Information About Sugar      4

## 2022-04-20 ENCOUNTER — Other Ambulatory Visit: Payer: Self-pay

## 2022-04-20 ENCOUNTER — Encounter: Payer: Self-pay | Admitting: Emergency Medicine

## 2022-04-20 ENCOUNTER — Emergency Department: Payer: Medicare (Managed Care)

## 2022-04-20 ENCOUNTER — Emergency Department
Admission: EM | Admit: 2022-04-20 | Discharge: 2022-04-20 | Disposition: A | Discharge status: Home / Self Care | Payer: Medicare (Managed Care) | Attending: Emergency Medicine | Admitting: Emergency Medicine

## 2022-04-20 DIAGNOSIS — I509 Heart failure, unspecified: Secondary | ICD-10-CM | POA: Diagnosis not present

## 2022-04-20 DIAGNOSIS — Z8673 Personal history of transient ischemic attack (TIA), and cerebral infarction without residual deficits: Secondary | ICD-10-CM | POA: Insufficient documentation

## 2022-04-20 DIAGNOSIS — F0390 Unspecified dementia without behavioral disturbance: Secondary | ICD-10-CM | POA: Insufficient documentation

## 2022-04-20 DIAGNOSIS — W050XXA Fall from non-moving wheelchair, initial encounter: Secondary | ICD-10-CM | POA: Insufficient documentation

## 2022-04-20 DIAGNOSIS — W19XXXA Unspecified fall, initial encounter: Secondary | ICD-10-CM

## 2022-04-20 DIAGNOSIS — E119 Type 2 diabetes mellitus without complications: Secondary | ICD-10-CM | POA: Insufficient documentation

## 2022-04-20 NOTE — ED Notes (Signed)
Attempted to give report 3 times to a [employer] member without success. A staff member answered the phone and advised she had no one to answer the phone. A staff member called back later and took report.

## 2022-04-20 NOTE — ED Provider Notes (Signed)
[facility] Provider Note    Event Date/Time   First MD Initiated Contact with Patient 04/20/22 1444     (approximate)   History   Fall   HPI  Ernest [name] is a 76 y.o. male with history of stroke, CHF, diabetes type 2, and multiple chronic medical problems presents from the [place] via EMS.  EMS has reported patient slid out of his wheelchair earlier today.  Patient does not complain of any pain.  Baseline dementia.      Physical Exam   Triage Vital Signs: ED Triage Vitals  Enc Vitals Group     BP 04/20/22 1420 121/90     Pulse Rate 04/20/22 1420 (!) 105     Resp 04/20/22 1420 18     Temp 04/20/22 1420 98.1 F (36.7 C)     Temp Source 04/20/22 1420 Oral     SpO2 04/20/22 1420 100 %     Weight 04/20/22 1421 145 lb 8.1 oz (66 kg)     Height 04/20/22 1421 5\' 8"  (1.727 m)     Head Circumference --      Peak Flow --      Pain Score 04/20/22 1421 0     Pain Loc --      Pain Edu? --      Excl. in [place]? --     Most recent vital signs: Vitals:   04/20/22 1420  BP: 121/90  Pulse: (!) 105  Resp: 18  Temp: 98.1 F (36.7 C)  SpO2: 100%     General: Awake, no distress.   CV:  Good peripheral perfusion. regular rate and  rhythm Resp:  Normal effort. Lungs CTA Abd:  No distention.   Other:  There is an above-the-knee amputation on the right side.  Stump is nontender, pelvis is nontender, spine is nontender, arms are nontender   ED Results / Procedures / Treatments   Labs (all labs ordered are listed, but only abnormal results are displayed) Labs Reviewed - No data to display   EKG     RADIOLOGY CT of the head    PROCEDURES:   Procedures   MEDICATIONS ORDERED IN ED: Medications - No data to display   IMPRESSION / MDM / [place] / ED COURSE  I reviewed the triage vital signs and the nursing notes.                              Differential diagnosis includes, but is not limited to, fall, contusion, sprain,  SAH, subdural  Patient's presentation is most consistent with acute complicated illness / injury requiring diagnostic workup.   The patient is a poor historian.  Due to this we will do a CT of his head to ensure there is no bleed.  There is no tenderness of the bony skeleton so will not perform x-rays.  Patient had dialysis on his regular schedule so no need for additional blood work.  CT of the head interpreted by me as being negative for any acute abnormality.  Confirmed by radiology  Patient will be sent back to the [facility].  Nursing staff is to call and give report.      FINAL CLINICAL IMPRESSION(S) / ED DIAGNOSES   Final diagnoses:  Fall, initial encounter     Rx / DC Orders   ED Discharge Orders     None        Note:  This document was prepared using Dragon voice recognition software and may include unintentional dictation errors.    Versie Starks, [name] 04/20/22 1625    Duffy Bruce, MD 04/20/22 [phone]

## 2022-04-20 NOTE — Discharge Instructions (Signed)
Your CTs are negative.  Return to the [place].  Follow-up with dialysis on your regular schedule

## 2022-04-20 NOTE — ED Notes (Signed)
Patient unable to sign MSE at this time due to mental status.

## 2022-04-20 NOTE — ED Notes (Signed)
Patient denies any pain upon palpation by Manuela Schwartz, [place].

## 2022-04-20 NOTE — ED Triage Notes (Signed)
Patient to ED via [facility] from [facility] for a fall. Patient states he slid out of wheel chair- denies and complaints at this time. Pt does receive dialysis and got that today. Wears 2L [place] at baseline.

## 2022-04-30 ENCOUNTER — Other Ambulatory Visit (INDEPENDENT_AMBULATORY_CARE_PROVIDER_SITE_OTHER): Payer: Self-pay | Admitting: Vascular Surgery

## 2022-04-30 DIAGNOSIS — I739 Peripheral vascular disease, unspecified: Secondary | ICD-10-CM

## 2022-05-01 ENCOUNTER — Encounter (INDEPENDENT_AMBULATORY_CARE_PROVIDER_SITE_OTHER): Payer: Self-pay | Admitting: Vascular Surgery

## 2022-05-01 ENCOUNTER — Ambulatory Visit (INDEPENDENT_AMBULATORY_CARE_PROVIDER_SITE_OTHER): Payer: Medicare (Managed Care)

## 2022-05-01 ENCOUNTER — Ambulatory Visit (INDEPENDENT_AMBULATORY_CARE_PROVIDER_SITE_OTHER): Payer: Medicare (Managed Care) | Admitting: Vascular Surgery

## 2022-05-01 VITALS — BP 126/71 | HR 61 | Resp 16

## 2022-05-01 DIAGNOSIS — I739 Peripheral vascular disease, unspecified: Secondary | ICD-10-CM

## 2022-05-01 DIAGNOSIS — N186 End stage renal disease: Secondary | ICD-10-CM | POA: Diagnosis not present

## 2022-05-01 DIAGNOSIS — E1165 Type 2 diabetes mellitus with hyperglycemia: Secondary | ICD-10-CM | POA: Diagnosis not present

## 2022-05-01 DIAGNOSIS — Z9889 Other specified postprocedural states: Secondary | ICD-10-CM | POA: Diagnosis not present

## 2022-05-01 DIAGNOSIS — I70262 Atherosclerosis of native arteries of extremities with gangrene, left leg: Secondary | ICD-10-CM | POA: Diagnosis not present

## 2022-05-01 DIAGNOSIS — I10 Essential (primary) hypertension: Secondary | ICD-10-CM

## 2022-05-01 NOTE — Assessment & Plan Note (Signed)
Impairs wound healing and contributes to small vessel disease.

## 2022-05-01 NOTE — Assessment & Plan Note (Signed)
Noninvasive studies showed noncompressible ABI but no appreciable digital flow in the left leg.  His anterior tibial artery was continuous without obvious stenosis in the foot by duplex his posterior tibial artery is occluded by duplex.  His peroneal artery also appears to provide additional flow by duplex.  This is consistent with his previous angiogram findings after intervention. At this point, his flow appears to be reasonably good with significant small vessel disease which we cannot do much about.  He needs to follow with podiatry and ultimately is going to lose these toes although they may auto amputate.  We will continue close interval follow-up every 3 to 4 months with noninvasive studies.

## 2022-05-01 NOTE — Assessment & Plan Note (Signed)
blood pressure control important in reducing the progression of atherosclerotic disease. On appropriate oral medications.  

## 2022-05-01 NOTE — Progress Notes (Signed)
MRN : [id number]  Ernest [name] is a 76 y.o. (06-Jul-1946) male who presents with chief complaint of  Chief Complaint  Patient presents with   Follow-up    Ultrasound follow up  .  History of Present Illness: Patient returns today in follow up of his PAD.  He underwent tibial intervention earlier this year for tibial disease with gangrenous changes to multiple toes on the left foot.  He has stable dry gangrenous changes to multiple toes on the left foot.  He provides no history but does not appear to be in any pain.  His caretaker with him says that he never seems to exhibit any pain.  He has no complaints today. Noninvasive studies showed noncompressible ABI but no appreciable digital flow in the left leg.  His anterior tibial artery was continuous without obvious stenosis in the foot by duplex his posterior tibial artery is occluded by duplex.  His peroneal artery also appears to provide additional flow by duplex.  This is consistent with his previous angiogram findings after intervention.  Current Outpatient Medications  Medication Sig Dispense Refill   acetaminophen (TYLENOL) 500 MG tablet Take 1,000 mg by mouth every 6 (six) hours as needed for mild pain.     amLODipine (NORVASC) 5 MG tablet Take 1 tablet (5 mg total) by mouth daily. 30 tablet 3   apixaban (ELIQUIS) 5 MG TABS tablet Take 1 tablet (5 mg total) by mouth 2 (two) times daily. 60 tablet 1   atorvastatin (LIPITOR) 40 MG tablet Take 40 mg by mouth at bedtime.     DULoxetine (CYMBALTA) 20 MG capsule Take 40 mg by mouth 2 (two) times daily.     levETIRAcetam (KEPPRA) 500 MG tablet Take 1 tablet (500 mg total) by mouth 2 (two) times daily. Only on Tuesday, Thursday, Saturday, Sunday 32 tablet 5   levETIRAcetam (KEPPRA) 500 MG tablet Take 1 tablet (500 mg total) by mouth 3 (three) times daily. On MWF - administer lunchtime dose once returns from dialysis 36 tablet 5   multivitamin (RENA-VIT) TABS tablet Take 1 tablet by mouth at  bedtime. 30 tablet 0   pantoprazole (PROTONIX) 40 MG tablet Take 1 tablet (40 mg total) by mouth daily. (Patient taking differently: Take 40 mg by mouth at bedtime.) 30 tablet 0   polyethylene glycol (MIRALAX / GLYCOLAX) 17 g packet Take 17 g by mouth daily. Dissolve in 4-8 ounces of water     senna-docusate (SENOKOT-S) 8.6-50 MG tablet Take 1 tablet by mouth at bedtime.     sevelamer carbonate (RENVELA) 800 MG tablet Take 800 mg by mouth 3 (three) times daily with meals. On Monday, Wednesday, and Friday. Give NOON dose on MWF asap when returning from dialysis     No current facility-administered medications for this visit.    Past Medical History:  Diagnosis Date   Abnormal stress test    s/p cath November 2013 with modest disease involving the ostial left main, proximal LAD, proximal RCA - do not appear to be hemodynamically signficant; mild LV dysfunction   Anemia    low iron   Anxiety    Arthritis    Bacteremia    Chronic systolic CHF (congestive heart failure) (HCC)    COPD (chronic obstructive pulmonary disease) ([place])    Coronary artery disease    a. per cath report 2013, b. 09/13/2017-CABG X4 & Mitral valve repair   Depression    DVT (deep venous thrombosis) (HCC)    arm -  right- finished blood thinner- not sure when it   Ejection fraction < 50%    Erectile dysfunction    penile implant   ESRD (end stage renal disease) ([place])    [facility] on a MWF schedule.      Gout    Hx: of   Hepatitis C    History of seizures    last one 2018   Hyperlipidemia    Hypertension    Mitral valve disease    s/p Mitral repair 09/13/17   Obesity    Retinopathy    Shortness of breath    Hx: of with exertion   Stroke [facility])    "several" left side weakness, speech affected   Type II or unspecified type diabetes mellitus without mention of complication, not stated as uncontrolled    adult onset    Past Surgical History:  Procedure Laterality Date   A/V FISTULAGRAM N/A 10/04/2017    Procedure: A/V FISTULAGRAM;  Surgeon: Elam Dutch, MD;  Location: [place] [facility];  Service: Cardiovascular;  Laterality: N/A;   A/V SHUNTOGRAM Left 07/12/2020   Procedure: A/V SHUNTOGRAM;  Surgeon: Serafina Mitchell, MD;  Location: [place] [facility];  Service: Cardiovascular;  Laterality: Left;   AMPUTATION Right 07/08/2021   Procedure: AMPUTATION ABOVE KNEE;  Surgeon: Elmore Guise, MD;  Location: [facility];  Service: Vascular;  Laterality: Right;   ANGIOPLASTY Left 09/24/2019   Procedure: Angioplasty Innominate Vien;  Surgeon: Serafina Mitchell, MD;  Location: [place];  Service: Vascular;  Laterality: Left;   AV FISTULA PLACEMENT Left 01/13/2013   Procedure: INSERTION OF ARTERIOVENOUS (AV) GORE-TEX GRAFT ARM;  Surgeon: Angelia Mould, MD;  Location: [place];  Service: Vascular;  Laterality: Left;   AV FISTULA PLACEMENT Left 05/05/2013   Procedure: INSERTION OF LEFT UPPER ARM  ARTERIOVENOUS GORTEX GRAFT;  Surgeon: Angelia Mould, MD;  Location: [place];  Service: Vascular;  Laterality: Left;   AV FISTULA PLACEMENT Left 11/26/2019   Procedure: INSERTION OF ARTERIOVENOUS (AV) GORE-TEX GRAFT, left  ARM;  Surgeon: Serafina Mitchell, MD;  Location: [place];  Service: Vascular;  Laterality: Left;   [place] REMOVAL Left 03/26/2013   Procedure: REMOVAL OF  NON INCORPORATED ARTERIOVENOUS GORETEX GRAFT ([place]) left arm * repair of  left brachial artery with vein patch angioplasty.;  Surgeon: Angelia Mould, MD;  Location: [place];  Service: Vascular;  Laterality: Left;   CARDIAC CATHETERIZATION  August 26, 2012   CATARACT EXTRACTION W/ INTRAOCULAR LENS  IMPLANT, BILATERAL     COLONOSCOPY     CORONARY ARTERY BYPASS GRAFT N/A 09/13/2017   Procedure: CORONARY ARTERY BYPASS GRAFTING (CABG) times four LIMA  to LAD, SVG sequentially to OM and RAMUS Intermediate and SVG to Acute Marginal;  Surgeon: Melrose Nakayama, MD;  Location: [place];  Service: Open Heart Surgery;  Laterality: N/A;    DIALYSIS/PERMA CATHETER INSERTION N/A 02/28/2021   Procedure: DIALYSIS/PERMA CATHETER EXCHANGE;  Surgeon: Katha Cabal, MD;  Location: [place] [facility];  Service: Cardiovascular;  Laterality: N/A;   EMBOLECTOMY Right 08/27/2013   Procedure: EMBOLECTOMY;  Surgeon: Mal Misty, MD;  Location: [place];  Service: Vascular;  Laterality: Right;  Thrombectomy of Radial and ulnar artery.   ENDOVASCULAR STENT INSERTION Left 09/24/2019   Procedure: Endovascular Stent Graft Insertion, Arm Graft;  Surgeon: Serafina Mitchell, MD;  Location: [facility];  Service: Vascular;  Laterality: Left;   EYE SURGERY     laser.  and surgery  for DM   fistula     RUE and wrist   FISTULOGRAM Left 09/24/2019   Procedure: SHUNTOGRAM OF ARTERIOVENOUS FISTULA LEFT ARM,;  Surgeon: Serafina Mitchell, MD;  Location: [place];  Service: Vascular;  Laterality: Left;   INSERTION OF DIALYSIS CATHETER Right 01/01/2013   Procedure: INSERTION OF DIALYSIS CATHETER right  internal jugular;  Surgeon: Serafina Mitchell, MD;  Location: [place];  Service: Vascular;  Laterality: Right;   INSERTION OF DIALYSIS CATHETER N/A 03/26/2013   Procedure: INSERTION OF DIALYSIS CATHETER Left internal jugular vein;  Surgeon: Angelia Mould, MD;  Location: [place];  Service: Vascular;  Laterality: N/A;   INSERTION OF DIALYSIS CATHETER Right 11/26/2019   Procedure: INSERTION OF DIALYSIS CATHETER, right internal jugular;  Surgeon: Serafina Mitchell, MD;  Location: [place];  Service: Vascular;  Laterality: Right;   IR THORACENTESIS ASP PLEURAL SPACE W/IMG GUIDE  09/30/2019   LEFT HEART CATH AND CORONARY ANGIOGRAPHY N/A 09/09/2017   Procedure: LEFT HEART CATH AND CORONARY ANGIOGRAPHY;  Surgeon: Troy Sine, MD;  Location: [place] [facility];  Service: Cardiovascular;  Laterality: N/A;   LIGATION OF ARTERIOVENOUS  FISTULA Right 12/30/2013   Procedure: REMOVAL OF SEGMENT OF GORTEX GRAFT AND FISTULA  AND REPAIR OF BRACHIAL ARTERY;  Surgeon: Mal Misty, MD;   Location: [place];  Service: Vascular;  Laterality: Right;   LOWER EXTREMITY ANGIOGRAPHY N/A 06/28/2021   Procedure: Lower Extremity Angiography;  Surgeon: Katha Cabal, MD;  Location: [place] [facility];  Service: Cardiovascular;  Laterality: N/A;   LOWER EXTREMITY ANGIOGRAPHY Left 11/23/2021   Procedure: LOWER EXTREMITY ANGIOGRAPHY;  Surgeon: Algernon Huxley, MD;  Location: [place] [facility];  Service: Cardiovascular;  Laterality: Left;   MITRAL VALVE REPAIR N/A 09/13/2017   Procedure: MITRAL VALVE REPAIR (MVR);  Surgeon: Melrose Nakayama, MD;  Location: [place];  Service: Open Heart Surgery;  Laterality: N/A;   MITRAL VALVE REPAIR (MV)/CORONARY ARTERY BYPASS GRAFTING (CABG)  09/13/2017   PENILE PROSTHESIS IMPLANT     1997.. no card   PERIPHERAL VASCULAR BALLOON ANGIOPLASTY Left 10/04/2017   Procedure: PERIPHERAL VASCULAR BALLOON ANGIOPLASTY;  Surgeon: Elam Dutch, MD;  Location: [place] [facility];  Service: Cardiovascular;  Laterality: Left;  AVF   PERIPHERAL VASCULAR BALLOON ANGIOPLASTY Left 07/12/2020   Procedure: PERIPHERAL VASCULAR BALLOON ANGIOPLASTY;  Surgeon: Serafina Mitchell, MD;  Location: [place] [facility];  Service: Cardiovascular;  Laterality: Left;  ARM SHUNTOGRAM   REVISION OF ARTERIOVENOUS GORETEX GRAFT Left 09/24/2019   Procedure: REVISION OF ARTERIOVENOUS GORETEX GRAFT LEFT ARM;  Surgeon: Serafina Mitchell, MD;  Location: [facility];  Service: Vascular;  Laterality: Left;   TEE WITHOUT CARDIOVERSION N/A 06/11/2013   Procedure: TRANSESOPHAGEAL ECHOCARDIOGRAM (TEE);  Surgeon: Larey Dresser, MD;  Location: [facility] ENDOSCOPY;  Service: Cardiovascular;  Laterality: N/A;   TEE WITHOUT CARDIOVERSION N/A 09/13/2017   Procedure: TRANSESOPHAGEAL ECHOCARDIOGRAM (TEE);  Surgeon: Melrose Nakayama, MD;  Location: [place];  Service: Open Heart Surgery;  Laterality: N/A;   THROMBECTOMY W/ EMBOLECTOMY Left 03/26/2013   Procedure: Attempted thrombectomy of left arm arteriovenous goretex  graft.;  Surgeon: Angelia Mould, MD;  Location[facility] OR;  Service: Vascular;  Laterality: Left;   ULTRASOUND GUIDANCE FOR VASCULAR ACCESS Bilateral 09/24/2019   Procedure: Ultrasound Guidance For Vascular Access, Right Femoral Vein and Left Arm Graft;  Surgeon: Serafina Mitchell, MD;  Location: [facility] OR;  Service: Vascular;  Laterality: Bilateral;   VENOGRAM N/A  04/07/2013   Procedure: VENOGRAM;  Surgeon: Serafina Mitchell, MD;  Location: [facility] CATH LAB;  Service: Cardiovascular;  Laterality: N/A;     Social History   Tobacco Use   Smoking status: Former    Years: 7.00    Types: Cigarettes    Quit date: 06/10/1973    Years since quitting: 48.9   Smokeless tobacco: Never   Tobacco comments:    Quit in 1974.  Vaping Use   Vaping Use: Never used  Substance Use Topics   Alcohol use: No    Alcohol/week: 0.0 standard drinks of alcohol   Drug use: No      Family History  Problem Relation Age of Onset   Heart disease Father    Diabetes Sister    Diabetes Brother    Diabetes Son     [name]   REVIEW OF SYSTEMS (Negative unless checked)  Constitutional: [] Weight loss  [] Fever  [] Chills Cardiac: [] Chest pain   [] Chest pressure   [] Palpitations   [] Shortness of breath when laying flat   [] Shortness of breath at rest   [x] Shortness of breath with exertion. Vascular:  [] Pain in legs with walking   [] Pain in legs at rest   [] Pain in legs when laying flat   [] Claudication   [] Pain in feet when walking  [] Pain in feet at rest  [] Pain in feet when laying flat   [] History of DVT   [] Phlebitis   [] Swelling in legs   [] Varicose veins   [x] Non-healing ulcers Pulmonary:   [] Uses home oxygen   [] Productive cough   [] Hemoptysis   [] Wheeze  [] COPD   [] Asthma Neurologic:  [] Dizziness  [] Blackouts   [] Seizures   [] History of stroke   [] History of TIA  [] Aphasia   [] Temporary blindness   [] Dysphagia   [] Weakness or numbness in arms   [] Weakness or numbness in legs Musculoskeletal:   [] Arthritis   [] Joint swelling   [] Joint pain   [] Low back pain Hematologic:  [] Easy bruising  [] Easy bleeding   [] Hypercoagulable state   [x] Anemic   Gastrointestinal:  [] Blood in stool   [] Vomiting blood  [] Gastroesophageal reflux/heartburn   [] Abdominal pain Genitourinary:  [x] Chronic kidney disease   [] Difficult urination  [] Frequent urination  [] Burning with urination   [] Hematuria Skin:  [] Rashes   [x] Ulcers   [x] Wounds Psychological:  [] History of anxiety   []  History of major depression.  Physical Examination  BP 126/71 (BP Location: Right Arm)   Pulse 61   Resp 16  Gen:  WD/WN, NAD, debilitated appearing Head: [place]/AT, No temporalis wasting. Ear/Nose/Throat: Hearing grossly intact, nares w/o erythema or drainage Eyes: Conjunctiva clear. Sclera non-icteric Neck: Supple.  Trachea midline Pulmonary:  Good air movement, no use of accessory muscles.  Cardiac: irregular Vascular:  Vessel Right Left  Radial Palpable Palpable                          PT Not Palpable Not Palpable  DP Not Palpable 1+ Palpable   Gastrointestinal: soft, non-tender/non-distended. No guarding/reflex.  Musculoskeletal: M/S 5/5 throughout.  In a wheelchair.  Right AKA.  Left toes 1 through 3 have dry gangrenous changes. Neurologic: Patient sleeps through the exam and this is difficult to assess Psychiatric: Patient sleeps through the exam and this is difficult to assess Dermatologic: Dry gangrenous changes to toes 1 through 3 on the left foot.      Labs No results found for this or any previous visit (  from the past 2160 hour(s)).  Radiology CT HEAD WO CONTRAST (5MM)  Result Date: 04/20/2022 CLINICAL DATA:  Head trauma, slid out of wheelchair, had dialysis today. History CHF, COPD, coronary artery disease, hypertension, type II diabetes mellitus EXAM: CT HEAD WITHOUT CONTRAST TECHNIQUE: Contiguous axial images were obtained from the base of the skull through the vertex without intravenous  contrast. RADIATION DOSE REDUCTION: This exam was performed according to the departmental dose-optimization program which includes automated exposure control, adjustment of the mA and/or kV according to patient size and/or use of iterative reconstruction technique. COMPARISON:  09/25/2021 FINDINGS: Brain: Generalized atrophy. Normal ventricular morphology. No midline shift or mass effect. Small vessel chronic ischemic changes of deep cerebral white matter. Benign-appearing calcifications within the basal ganglia bilaterally. Additional linear vascular calcifications are seen within centrum semiovale and subcortical white matter near the vertices. Ossification of falx. Tiny old lacunar infarcts in the thalami. No intracranial hemorrhage, mass lesion, or evidence of acute infarction. No extra-axial fluid collections. Vascular: Extensive vascular calcifications consistent with history of end-stage renal disease. No hyperdense vessels. Skull: Intact Sinuses/Orbits: Minimal fluid within RIGHT sphenoid sinus and RIGHT mastoid air cells. Remaining paranasal sinuses clear. Other: N/A IMPRESSION: Atrophy with small vessel chronic ischemic changes of deep cerebral white matter. Tiny old lacunar infarcts in the thalami. No acute intracranial abnormalities. Electronically Signed   By: Lavonia Dana M.D.   On: 04/20/2022 16:00    Assessment/Plan  Atherosclerosis of native arteries of the extremities with gangrene ([place]) Noninvasive studies showed noncompressible ABI but no appreciable digital flow in the left leg.  His anterior tibial artery was continuous without obvious stenosis in the foot by duplex his posterior tibial artery is occluded by duplex.  His peroneal artery also appears to provide additional flow by duplex.  This is consistent with his previous angiogram findings after intervention. At this point, his flow appears to be reasonably good with significant small vessel disease which we cannot do much about.  He  needs to follow with podiatry and ultimately is going to lose these toes although they may auto amputate.  We will continue close interval follow-up every 3 to 4 months with noninvasive studies.  Essential hypertension blood pressure control important in reducing the progression of atherosclerotic disease. On appropriate oral medications.   Type 2 diabetes mellitus with hyperglycemia, without long-term current use of insulin (HCC) blood glucose control important in reducing the progression of atherosclerotic disease. Also, involved in wound healing. On appropriate medications.   ESRD (end stage renal disease) ([place]) Impairs wound healing and contributes to small vessel disease.    [name], MD  05/01/2022 11:58 AM    This note was created with Dragon medical transcription system.  Any errors from dictation are purely unintentional

## 2022-05-01 NOTE — Assessment & Plan Note (Signed)
blood glucose control important in reducing the progression of atherosclerotic disease. Also, involved in wound healing. On appropriate medications.  

## 2022-06-02 ENCOUNTER — Other Ambulatory Visit: Payer: Self-pay

## 2022-06-02 ENCOUNTER — Inpatient Hospital Stay
Admission: EM | Admit: 2022-06-02 | Discharge: 2022-06-22 | DRG: 239 | Disposition: A | Discharge status: Hospice | Payer: Medicare (Managed Care) | Source: Skilled Nursing Facility | Attending: Internal Medicine | Admitting: Internal Medicine

## 2022-06-02 ENCOUNTER — Encounter: Payer: Self-pay | Admitting: Emergency Medicine

## 2022-06-02 DIAGNOSIS — R64 Cachexia: Secondary | ICD-10-CM | POA: Diagnosis present

## 2022-06-02 DIAGNOSIS — Z515 Encounter for palliative care: Secondary | ICD-10-CM | POA: Diagnosis not present

## 2022-06-02 DIAGNOSIS — Z8249 Family history of ischemic heart disease and other diseases of the circulatory system: Secondary | ICD-10-CM

## 2022-06-02 DIAGNOSIS — Z91158 Patient's noncompliance with renal dialysis for other reason: Secondary | ICD-10-CM

## 2022-06-02 DIAGNOSIS — F0390 Unspecified dementia without behavioral disturbance: Secondary | ICD-10-CM | POA: Diagnosis not present

## 2022-06-02 DIAGNOSIS — L89152 Pressure ulcer of sacral region, stage 2: Secondary | ICD-10-CM | POA: Diagnosis present

## 2022-06-02 DIAGNOSIS — I5043 Acute on chronic combined systolic (congestive) and diastolic (congestive) heart failure: Secondary | ICD-10-CM | POA: Diagnosis not present

## 2022-06-02 DIAGNOSIS — Z8619 Personal history of other infectious and parasitic diseases: Secondary | ICD-10-CM

## 2022-06-02 DIAGNOSIS — I69359 Hemiplegia and hemiparesis following cerebral infarction affecting unspecified side: Secondary | ICD-10-CM

## 2022-06-02 DIAGNOSIS — E669 Obesity, unspecified: Secondary | ICD-10-CM | POA: Diagnosis present

## 2022-06-02 DIAGNOSIS — I132 Hypertensive heart and chronic kidney disease with heart failure and with stage 5 chronic kidney disease, or end stage renal disease: Secondary | ICD-10-CM | POA: Diagnosis present

## 2022-06-02 DIAGNOSIS — E1122 Type 2 diabetes mellitus with diabetic chronic kidney disease: Secondary | ICD-10-CM | POA: Diagnosis present

## 2022-06-02 DIAGNOSIS — K7682 Hepatic encephalopathy: Secondary | ICD-10-CM | POA: Diagnosis not present

## 2022-06-02 DIAGNOSIS — E876 Hypokalemia: Secondary | ICD-10-CM | POA: Diagnosis not present

## 2022-06-02 DIAGNOSIS — R778 Other specified abnormalities of plasma proteins: Secondary | ICD-10-CM | POA: Diagnosis not present

## 2022-06-02 DIAGNOSIS — I9589 Other hypotension: Secondary | ICD-10-CM | POA: Diagnosis present

## 2022-06-02 DIAGNOSIS — I4892 Unspecified atrial flutter: Secondary | ICD-10-CM | POA: Diagnosis not present

## 2022-06-02 DIAGNOSIS — E1169 Type 2 diabetes mellitus with other specified complication: Secondary | ICD-10-CM | POA: Diagnosis present

## 2022-06-02 DIAGNOSIS — E11319 Type 2 diabetes mellitus with unspecified diabetic retinopathy without macular edema: Secondary | ICD-10-CM | POA: Diagnosis present

## 2022-06-02 DIAGNOSIS — J9621 Acute and chronic respiratory failure with hypoxia: Secondary | ICD-10-CM | POA: Diagnosis present

## 2022-06-02 DIAGNOSIS — I472 Ventricular tachycardia, unspecified: Secondary | ICD-10-CM | POA: Diagnosis not present

## 2022-06-02 DIAGNOSIS — I255 Ischemic cardiomyopathy: Secondary | ICD-10-CM | POA: Diagnosis present

## 2022-06-02 DIAGNOSIS — F4329 Adjustment disorder with other symptoms: Secondary | ICD-10-CM | POA: Diagnosis not present

## 2022-06-02 DIAGNOSIS — R001 Bradycardia, unspecified: Secondary | ICD-10-CM | POA: Diagnosis not present

## 2022-06-02 DIAGNOSIS — I4891 Unspecified atrial fibrillation: Secondary | ICD-10-CM | POA: Diagnosis present

## 2022-06-02 DIAGNOSIS — I5022 Chronic systolic (congestive) heart failure: Secondary | ICD-10-CM | POA: Diagnosis not present

## 2022-06-02 DIAGNOSIS — I214 Non-ST elevation (NSTEMI) myocardial infarction: Secondary | ICD-10-CM | POA: Diagnosis not present

## 2022-06-02 DIAGNOSIS — E1152 Type 2 diabetes mellitus with diabetic peripheral angiopathy with gangrene: Secondary | ICD-10-CM | POA: Diagnosis present

## 2022-06-02 DIAGNOSIS — I110 Hypertensive heart disease with heart failure: Secondary | ICD-10-CM | POA: Diagnosis not present

## 2022-06-02 DIAGNOSIS — I69398 Other sequelae of cerebral infarction: Secondary | ICD-10-CM

## 2022-06-02 DIAGNOSIS — L8990 Pressure ulcer of unspecified site, unspecified stage: Secondary | ICD-10-CM | POA: Diagnosis not present

## 2022-06-02 DIAGNOSIS — Z951 Presence of aortocoronary bypass graft: Secondary | ICD-10-CM | POA: Diagnosis not present

## 2022-06-02 DIAGNOSIS — N2581 Secondary hyperparathyroidism of renal origin: Secondary | ICD-10-CM | POA: Diagnosis not present

## 2022-06-02 DIAGNOSIS — Z681 Body mass index (BMI) 19 or less, adult: Secondary | ICD-10-CM

## 2022-06-02 DIAGNOSIS — G9340 Encephalopathy, unspecified: Secondary | ICD-10-CM | POA: Diagnosis not present

## 2022-06-02 DIAGNOSIS — D649 Anemia, unspecified: Secondary | ICD-10-CM | POA: Diagnosis not present

## 2022-06-02 DIAGNOSIS — R569 Unspecified convulsions: Secondary | ICD-10-CM | POA: Diagnosis not present

## 2022-06-02 DIAGNOSIS — E1165 Type 2 diabetes mellitus with hyperglycemia: Secondary | ICD-10-CM | POA: Diagnosis present

## 2022-06-02 DIAGNOSIS — D631 Anemia in chronic kidney disease: Secondary | ICD-10-CM | POA: Diagnosis present

## 2022-06-02 DIAGNOSIS — I252 Old myocardial infarction: Secondary | ICD-10-CM

## 2022-06-02 DIAGNOSIS — A419 Sepsis, unspecified organism: Secondary | ICD-10-CM | POA: Diagnosis present

## 2022-06-02 DIAGNOSIS — I2510 Atherosclerotic heart disease of native coronary artery without angina pectoris: Secondary | ICD-10-CM | POA: Diagnosis present

## 2022-06-02 DIAGNOSIS — D638 Anemia in other chronic diseases classified elsewhere: Secondary | ICD-10-CM | POA: Diagnosis present

## 2022-06-02 DIAGNOSIS — Z89611 Acquired absence of right leg above knee: Secondary | ICD-10-CM | POA: Diagnosis not present

## 2022-06-02 DIAGNOSIS — R7301 Impaired fasting glucose: Secondary | ICD-10-CM

## 2022-06-02 DIAGNOSIS — I70269 Atherosclerosis of native arteries of extremities with gangrene, unspecified extremity: Secondary | ICD-10-CM | POA: Diagnosis present

## 2022-06-02 DIAGNOSIS — F0150 Vascular dementia without behavioral disturbance: Secondary | ICD-10-CM | POA: Diagnosis present

## 2022-06-02 DIAGNOSIS — I5023 Acute on chronic systolic (congestive) heart failure: Secondary | ICD-10-CM | POA: Diagnosis not present

## 2022-06-02 DIAGNOSIS — G4733 Obstructive sleep apnea (adult) (pediatric): Secondary | ICD-10-CM | POA: Diagnosis present

## 2022-06-02 DIAGNOSIS — I953 Hypotension of hemodialysis: Secondary | ICD-10-CM | POA: Diagnosis not present

## 2022-06-02 DIAGNOSIS — Z992 Dependence on renal dialysis: Secondary | ICD-10-CM

## 2022-06-02 DIAGNOSIS — Z7189 Other specified counseling: Secondary | ICD-10-CM | POA: Diagnosis not present

## 2022-06-02 DIAGNOSIS — I69328 Other speech and language deficits following cerebral infarction: Secondary | ICD-10-CM

## 2022-06-02 DIAGNOSIS — Z7901 Long term (current) use of anticoagulants: Secondary | ICD-10-CM

## 2022-06-02 DIAGNOSIS — G40209 Localization-related (focal) (partial) symptomatic epilepsy and epileptic syndromes with complex partial seizures, not intractable, without status epilepticus: Secondary | ICD-10-CM | POA: Diagnosis present

## 2022-06-02 DIAGNOSIS — Z952 Presence of prosthetic heart valve: Secondary | ICD-10-CM | POA: Diagnosis not present

## 2022-06-02 DIAGNOSIS — I69354 Hemiplegia and hemiparesis following cerebral infarction affecting left non-dominant side: Secondary | ICD-10-CM

## 2022-06-02 DIAGNOSIS — I739 Peripheral vascular disease, unspecified: Secondary | ICD-10-CM | POA: Diagnosis not present

## 2022-06-02 DIAGNOSIS — M109 Gout, unspecified: Secondary | ICD-10-CM | POA: Diagnosis present

## 2022-06-02 DIAGNOSIS — F0154 Vascular dementia, unspecified severity, with anxiety: Secondary | ICD-10-CM | POA: Diagnosis present

## 2022-06-02 DIAGNOSIS — E7800 Pure hypercholesterolemia, unspecified: Secondary | ICD-10-CM | POA: Diagnosis present

## 2022-06-02 DIAGNOSIS — M1990 Unspecified osteoarthritis, unspecified site: Secondary | ICD-10-CM | POA: Diagnosis present

## 2022-06-02 DIAGNOSIS — Z833 Family history of diabetes mellitus: Secondary | ICD-10-CM

## 2022-06-02 DIAGNOSIS — E43 Unspecified severe protein-calorie malnutrition: Secondary | ICD-10-CM | POA: Diagnosis present

## 2022-06-02 DIAGNOSIS — I70262 Atherosclerosis of native arteries of extremities with gangrene, left leg: Secondary | ICD-10-CM | POA: Diagnosis not present

## 2022-06-02 DIAGNOSIS — R6521 Severe sepsis with septic shock: Secondary | ICD-10-CM | POA: Diagnosis not present

## 2022-06-02 DIAGNOSIS — N186 End stage renal disease: Secondary | ICD-10-CM | POA: Diagnosis present

## 2022-06-02 DIAGNOSIS — Z66 Do not resuscitate: Secondary | ICD-10-CM | POA: Diagnosis not present

## 2022-06-02 DIAGNOSIS — F0153 Vascular dementia, unspecified severity, with mood disturbance: Secondary | ICD-10-CM | POA: Diagnosis present

## 2022-06-02 DIAGNOSIS — I959 Hypotension, unspecified: Secondary | ICD-10-CM | POA: Diagnosis present

## 2022-06-02 DIAGNOSIS — Z89512 Acquired absence of left leg below knee: Secondary | ICD-10-CM

## 2022-06-02 DIAGNOSIS — E1142 Type 2 diabetes mellitus with diabetic polyneuropathy: Secondary | ICD-10-CM | POA: Diagnosis present

## 2022-06-02 DIAGNOSIS — J449 Chronic obstructive pulmonary disease, unspecified: Secondary | ICD-10-CM | POA: Diagnosis present

## 2022-06-02 DIAGNOSIS — I69318 Other symptoms and signs involving cognitive functions following cerebral infarction: Secondary | ICD-10-CM

## 2022-06-02 DIAGNOSIS — Z8673 Personal history of transient ischemic attack (TIA), and cerebral infarction without residual deficits: Secondary | ICD-10-CM

## 2022-06-02 DIAGNOSIS — I96 Gangrene, not elsewhere classified: Principal | ICD-10-CM

## 2022-06-02 DIAGNOSIS — F4320 Adjustment disorder, unspecified: Secondary | ICD-10-CM | POA: Diagnosis present

## 2022-06-02 DIAGNOSIS — I10 Essential (primary) hypertension: Secondary | ICD-10-CM | POA: Diagnosis present

## 2022-06-02 DIAGNOSIS — Z8616 Personal history of COVID-19: Secondary | ICD-10-CM

## 2022-06-02 DIAGNOSIS — E1151 Type 2 diabetes mellitus with diabetic peripheral angiopathy without gangrene: Secondary | ICD-10-CM | POA: Diagnosis present

## 2022-06-02 DIAGNOSIS — J690 Pneumonitis due to inhalation of food and vomit: Secondary | ICD-10-CM | POA: Diagnosis not present

## 2022-06-02 DIAGNOSIS — Z79899 Other long term (current) drug therapy: Secondary | ICD-10-CM

## 2022-06-02 DIAGNOSIS — H9190 Unspecified hearing loss, unspecified ear: Secondary | ICD-10-CM | POA: Diagnosis present

## 2022-06-02 DIAGNOSIS — Z87891 Personal history of nicotine dependence: Secondary | ICD-10-CM

## 2022-06-02 DIAGNOSIS — F01518 Vascular dementia, unspecified severity, with other behavioral disturbance: Secondary | ICD-10-CM | POA: Diagnosis not present

## 2022-06-02 DIAGNOSIS — R748 Abnormal levels of other serum enzymes: Secondary | ICD-10-CM | POA: Diagnosis not present

## 2022-06-02 DIAGNOSIS — Z86718 Personal history of other venous thrombosis and embolism: Secondary | ICD-10-CM

## 2022-06-02 LAB — CBC WITH DIFFERENTIAL/PLATELET
Abs Immature Granulocytes: 0.01 10*3/uL (ref 0.00–0.07)
Basophils Absolute: 0.1 10*3/uL (ref 0.0–0.1)
Basophils Relative: 1 %
Eosinophils Absolute: 0.1 10*3/uL (ref 0.0–0.5)
Eosinophils Relative: 2 %
HCT: 37.3 % — ABNORMAL LOW (ref 39.0–52.0)
Hemoglobin: 11.6 g/dL — ABNORMAL LOW (ref 13.0–17.0)
Immature Granulocytes: 0 %
Lymphocytes Relative: 12 %
Lymphs Abs: 1 10*3/uL (ref 0.7–4.0)
MCH: 25 pg — ABNORMAL LOW (ref 26.0–34.0)
MCHC: 31.1 g/dL (ref 30.0–36.0)
MCV: 80.4 fL (ref 80.0–100.0)
Monocytes Absolute: 0.9 10*3/uL (ref 0.1–1.0)
Monocytes Relative: 11 %
Neutro Abs: 6.2 10*3/uL (ref 1.7–7.7)
Neutrophils Relative %: 74 %
Platelets: 269 10*3/uL (ref 150–400)
RBC: 4.64 MIL/uL (ref 4.22–5.81)
RDW: 19.2 % — ABNORMAL HIGH (ref 11.5–15.5)
WBC: 8.3 10*3/uL (ref 4.0–10.5)
nRBC: 0 % (ref 0.0–0.2)

## 2022-06-02 LAB — COMPREHENSIVE METABOLIC PANEL
ALT: 9 U/L (ref 0–44)
AST: 16 U/L (ref 15–41)
Albumin: 2.2 g/dL — ABNORMAL LOW (ref 3.5–5.0)
Alkaline Phosphatase: 85 U/L (ref 38–126)
Anion gap: 9 (ref 5–15)
BUN: 26 mg/dL — ABNORMAL HIGH (ref 8–23)
CO2: 28 mmol/L (ref 22–32)
Calcium: 7.4 mg/dL — ABNORMAL LOW (ref 8.9–10.3)
Chloride: 97 mmol/L — ABNORMAL LOW (ref 98–111)
Creatinine, Ser: 2.94 mg/dL — ABNORMAL HIGH (ref 0.61–1.24)
GFR, Estimated: 22 mL/min — ABNORMAL LOW (ref >60)
Glucose, Bld: 170 mg/dL — ABNORMAL HIGH (ref 70–99)
Potassium: 2.6 mmol/L — CL (ref 3.5–5.1)
Sodium: 134 mmol/L — ABNORMAL LOW (ref 135–145)
Total Bilirubin: 0.9 mg/dL (ref 0.3–1.2)
Total Protein: 6.2 g/dL — ABNORMAL LOW (ref 6.5–8.1)

## 2022-06-02 LAB — SEDIMENTATION RATE: Sed Rate: 25 mm/hr — ABNORMAL HIGH (ref 0–20)

## 2022-06-02 LAB — LACTIC ACID, PLASMA
Lactic Acid, Venous: 1 mmol/L (ref 0.5–1.9)
Lactic Acid, Venous: 1 mmol/L (ref 0.5–1.9)

## 2022-06-02 LAB — GLUCOSE, CAPILLARY: Glucose-Capillary: 116 mg/dL — ABNORMAL HIGH (ref 70–99)

## 2022-06-02 MED ORDER — ATORVASTATIN CALCIUM 20 MG PO TABS
40.0000 mg | ORAL_TABLET | Freq: Every day | ORAL | Status: DC
Start: 2022-06-02 — End: 2022-06-19
  Administered 2022-06-02 – 2022-06-18 (×13): 40 mg via ORAL
  Filled 2022-06-02 (×14): qty 2

## 2022-06-02 MED ORDER — AMLODIPINE BESYLATE 5 MG PO TABS
5.0000 mg | ORAL_TABLET | Freq: Every day | ORAL | Status: DC
Start: 2022-06-02 — End: 2022-06-04
  Administered 2022-06-02 – 2022-06-03 (×2): 5 mg via ORAL
  Filled 2022-06-02 (×3): qty 1

## 2022-06-02 MED ORDER — APIXABAN 2.5 MG PO TABS
2.5000 mg | ORAL_TABLET | Freq: Two times a day (BID) | ORAL | Status: DC
Start: 2022-06-02 — End: 2022-06-03
  Administered 2022-06-02 – 2022-06-03 (×2): 2.5 mg via ORAL
  Filled 2022-06-02 (×2): qty 1

## 2022-06-02 MED ORDER — ONDANSETRON HCL 4 MG PO TABS: 4.0000 mg | ORAL_TABLET | Freq: Four times a day (QID) | ORAL | Status: DC | PRN

## 2022-06-02 MED ORDER — PANTOPRAZOLE SODIUM 40 MG PO TBEC
40.0000 mg | DELAYED_RELEASE_TABLET | Freq: Every day | ORAL | Status: DC
Start: 2022-06-02 — End: 2022-06-14
  Administered 2022-06-02 – 2022-06-12 (×9): 40 mg via ORAL
  Filled 2022-06-02 (×9): qty 1

## 2022-06-02 MED ORDER — POTASSIUM CHLORIDE 20 MEQ PO PACK
40.0000 meq | PACK | Freq: Once | ORAL | Status: AC
  Administered 2022-06-02: 40 meq via ORAL
  Filled 2022-06-02: qty 2

## 2022-06-02 MED ORDER — INSULIN ASPART 100 UNIT/ML IJ SOLN: 0.0000 [IU] | Freq: Three times a day (TID) | INTRAMUSCULAR | Status: DC

## 2022-06-02 MED ORDER — SENNOSIDES-DOCUSATE SODIUM 8.6-50 MG PO TABS
1.0000 | ORAL_TABLET | Freq: Every day | ORAL | Status: DC
  Administered 2022-06-02 – 2022-06-17 (×13): 1 via ORAL
  Filled 2022-06-02 (×13): qty 1

## 2022-06-02 MED ORDER — LEVETIRACETAM 500 MG PO TABS
500.0000 mg | ORAL_TABLET | ORAL | Status: DC
  Administered 2022-06-03 (×2): 500 mg via ORAL
  Filled 2022-06-02 (×2): qty 1

## 2022-06-02 MED ORDER — DULOXETINE HCL 20 MG PO CPEP
40.0000 mg | ORAL_CAPSULE | Freq: Two times a day (BID) | ORAL | Status: DC
  Administered 2022-06-02 – 2022-06-17 (×22): 40 mg via ORAL
  Filled 2022-06-02 (×32): qty 2

## 2022-06-02 MED ORDER — ACETAMINOPHEN 500 MG PO TABS: 1000.0000 mg | ORAL_TABLET | Freq: Four times a day (QID) | ORAL | Status: DC | PRN

## 2022-06-02 MED ORDER — SEVELAMER CARBONATE 800 MG PO TABS
800.0000 mg | ORAL_TABLET | Freq: Three times a day (TID) | ORAL | Status: DC
  Administered 2022-06-03 – 2022-06-11 (×19): 800 mg via ORAL
  Filled 2022-06-02 (×21): qty 1

## 2022-06-02 MED ORDER — RENA-VITE PO TABS
1.0000 | ORAL_TABLET | Freq: Every day | ORAL | Status: DC
  Administered 2022-06-02 – 2022-06-17 (×14): 1 via ORAL
  Filled 2022-06-02 (×14): qty 1

## 2022-06-02 MED ORDER — LEVETIRACETAM 500 MG PO TABS
500.0000 mg | ORAL_TABLET | ORAL | Status: DC
  Administered 2022-06-04: 500 mg via ORAL
  Filled 2022-06-02: qty 1

## 2022-06-02 MED ORDER — ONDANSETRON HCL 4 MG/2ML IJ SOLN
4.0000 mg | Freq: Four times a day (QID) | INTRAMUSCULAR | Status: DC | PRN
  Filled 2022-06-02: qty 2

## 2022-06-02 NOTE — ED Provider Notes (Signed)
[facility] Provider Note    Event Date/Time   First MD Initiated Contact with Patient 06/02/22 1530     (approximate)   History   Leg Pain and Wound Infection   HPI  Ernest [name] is a 76 y.o. male past medical history of end-stage renal disease, hypertension, systolic heart failure, COPD peripheral vascular disease with chronic gangrene of multiple toes on the left foot presents due to concern for darkening of the left foot.  Patient is accompanied by his daughter who has not seen him since May.  She said that previously he has had some darkening of the toes but this looks much more severe.  Patient denies any pain and really does not feel the foot.  I spoke with the nurse who takes care of the patient for the last month and a half at [facility] she tells me that the foot has looked like this for the entirety of her time taking care of the patient and that there are other providers who have been with him longer and say that it is looked like this for some time really since April has been getting worse but not acutely.  They have no other concerns stated at baseline he is quite sleepy.  Dialysis Monday Wednesday Friday      Follows with Dr. Lucky Cowboy.  Per last note about 1 month ago in the office patient has chronic gangrene that is dry to multiple toes on the left foot.  Not have palpable PT or DP pulses at baseline.  Per Dr. Maryruth Bun note his anterior tibial artery is continuous without obvious stenosis and his posterior tibial artery is occluded peroneal artery appears to provide additional flow by duplex.  Past Medical History:  Diagnosis Date   Abnormal stress test    s/p cath November 2013 with modest disease involving the ostial left main, proximal LAD, proximal RCA - do not appear to be hemodynamically signficant; mild LV dysfunction   Anemia    low iron   Anxiety    Arthritis    Bacteremia    Chronic systolic CHF (congestive heart failure) (HCC)     COPD (chronic obstructive pulmonary disease) ([place])    Coronary artery disease    a. per cath report 2013, b. 09/13/2017-CABG X4 & Mitral valve repair   Depression    DVT (deep venous thrombosis) (HCC)    arm - right- finished blood thinner- not sure when it   Ejection fraction < 50%    Erectile dysfunction    penile implant   ESRD (end stage renal disease) ([place])    [facility] on a MWF schedule.      Gout    Hx: of   Hepatitis C    History of seizures    last one 2018   Hyperlipidemia    Hypertension    Mitral valve disease    s/p Mitral repair 09/13/17   Obesity    Retinopathy    Shortness of breath    Hx: of with exertion   Stroke [facility])    "several" left side weakness, speech affected   Type II or unspecified type diabetes mellitus without mention of complication, not stated as uncontrolled    adult onset    Patient Active Problem List   Diagnosis Date Noted   Chronic respiratory failure with hypoxia ([place]) 10/02/2021   Slurred speech 10/02/2021   Osteomyelitis of right foot ([place])    Acquired absence of right leg  above knee ([place]) 07/21/2021   Pressure injury of skin 07/19/2021   Pressure ulcer 07/16/2021   Hypotension    Wet gangrene ([place]) 07/10/2021   S/P AKA (above knee amputation), right ([place]) 07/10/2021   Atherosclerosis of native arteries of the extremities with gangrene ([place]) 07/08/2021   Sepsis ([place]) 06/27/2021   Bacterial infection, unspecified 03/31/2021   Contact with and (suspected) exposure to covid-19 03/13/2021   COVID-19 03/07/2021   Disorder of phosphorus metabolism, unspecified 02/20/2021   Hypercalcemia 12/08/2020   Coronary artery disease involving native coronary artery of native heart without angina pectoris 11/18/2020   Diarrhea, unspecified 11/18/2020   Fever, unspecified 11/18/2020   Other fluid overload 11/18/2020   Pain, unspecified 11/18/2020   Pleural effusion on left 09/30/2019   Acute respiratory failure with hypoxia (HCC) 09/30/2019    Pleural effusion 09/30/2019   Chest pain 12/02/2018   Vascular dementia ([place]) 10/10/2017   Atrial flutter ([place]) 10/10/2017   Altered mental status, unspecified 10/09/2017   Other malaise 10/09/2017   S/P CABG x 4 10/04/2017   Cerebral embolism with cerebral infarction 09/20/2017   NSVT (nonsustained ventricular tachycardia) ([place])    Dyspnea 09/08/2017   Non-ST elevation MI (NSTEMI) ([place])    Acute on chronic combined systolic and diastolic CHF (congestive heart failure) ([place])    End-stage renal disease on hemodialysis ([place])    Left leg weakness    TIA (transient ischemic attack) 10/27/2016   Essential hypertension 09/18/2015   Dependence on renal dialysis ([place]) 08/09/2015   History of CVA (cerebrovascular accident) 08/07/2015   Seizure, late effect of stroke ([place]) 08/07/2015   Diabetic peripheral neuropathy ([place]) 08/07/2015   Unspecified protein-calorie malnutrition ([place]) 05/27/2015   Seizure ([place]) 04/29/2015   Coagulation defect, unspecified ([place]) 07/16/2014   Type 2 diabetes mellitus with diabetic peripheral angiopathy without gangrene ([place]) 07/01/2014   Pruritus, unspecified 05/26/2014   Elevated prostate specific antigen (PSA) 08/03/2013   Encounter for immunization 07/10/2013   Obstructive sleep apnea 06/16/2013   Hyperlipidemia associated with type 2 diabetes mellitus ([place])    ESRD (end stage renal disease) ([place])    Erectile dysfunction associated with type 2 diabetes mellitus ([place])    Hypertension due to end stage renal disease caused by type 2 diabetes mellitus, on dialysis ([employer] [facility])    COPD (chronic obstructive pulmonary disease) ([place])    Chronic HFrEF (heart failure with reduced ejection fraction) (HCC)    Anemia of chronic disease 12/27/2011   Long term (current) use of insulin ([place]) 11/03/2009   Gout, unspecified 02/28/2009   Iron deficiency anemia, unspecified 10/30/2008   Secondary hyperparathyroidism of renal origin ([place]) 10/30/2008   Type 2 diabetes mellitus with  hyperglycemia, without long-term current use of insulin ([place]) 06/08/1984     Physical Exam  Triage Vital Signs: ED Triage Vitals  Enc Vitals Group     BP 06/02/22 1519 108/66     Pulse Rate 06/02/22 1519 93     Resp 06/02/22 1519 17     Temp 06/02/22 1519 97.8 F (36.6 C)     Temp Source 06/02/22 1519 Oral     SpO2 06/02/22 1519 97 %     Weight 06/02/22 1516 145 lb 8.1 oz (66 kg)     Height 06/02/22 1516 5\' 8"  (1.727 m)     Head Circumference --      Peak Flow --      Pain Score 06/02/22 1516 10     Pain Loc --  Pain Edu? --      Excl. in [place]? --     Most recent vital signs: Vitals:   06/02/22 1519  BP: 108/66  Pulse: 93  Resp: 17  Temp: 97.8 F (36.6 C)  SpO2: 97%     General: Awake, no distress.  Chronically ill-appearing CV:  Good peripheral perfusion.  Status post right AKA, left foot with toes 1 through 3 with dry gangrene extending to the distal midfoot Dopplerable biphasic DP and PT pulses on the left, no palpable pulses Resp:  Normal effort.  No increased work of breathing Abd:  No distention.  Nontender Neuro:             Awake, Alert, Oriented x 3  Other:     ED Results / Procedures / Treatments  Labs (all labs ordered are listed, but only abnormal results are displayed) Labs Reviewed  COMPREHENSIVE METABOLIC PANEL - Abnormal; Notable for the following components:      Result Value   Sodium 134 (*)    Potassium 2.6 (*)    Chloride 97 (*)    Glucose, Bld 170 (*)    BUN 26 (*)    Creatinine, Ser 2.94 (*)    Calcium 7.4 (*)    Total Protein 6.2 (*)    Albumin 2.2 (*)    GFR, Estimated 22 (*)    All other components within normal limits  CBC WITH DIFFERENTIAL/PLATELET - Abnormal; Notable for the following components:   Hemoglobin 11.6 (*)    HCT 37.3 (*)    MCH 25.0 (*)    RDW 19.2 (*)    All other components within normal limits  SEDIMENTATION RATE - Abnormal; Notable for the following components:   Sed Rate 25 (*)    All other  components within normal limits  CULTURE, BLOOD (ROUTINE X 2)  CULTURE, BLOOD (ROUTINE X 2)  LACTIC ACID, PLASMA  LACTIC ACID, PLASMA  URINALYSIS, ROUTINE W REFLEX MICROSCOPIC  C-REACTIVE PROTEIN     EKG  EKG interpreted by myself shows sinus rhythm with PVC poor R wave progression anterior Q waves T wave inversion V5 V6   RADIOLOGY    PROCEDURES:  Critical Care performed: No  Procedures  The patient is on the cardiac monitor to evaluate for evidence of arrhythmia and/or significant heart rate changes.   MEDICATIONS ORDERED IN ED: Medications  potassium chloride (KLOR-CON) packet 40 mEq (40 mEq Oral Given 06/02/22 1645)     IMPRESSION / MDM / ASSESSMENT AND PLAN / ED COURSE  I reviewed the triage vital signs and the nursing notes.                              Patient's presentation is most consistent with acute presentation with potential threat to life or bodily function.  Differential diagnosis includes, but is not limited to, dry gangrene, wet gangrene, osteomyelitis  Patient is a 76 year old male with multiple comorbidities presenting with concern for gangrene of the left foot.  Patient has history of peripheral vascular disease status post right AKA chronic gangrene of left toes.  On review of vascular's note from a month ago he did have gangrene noted at that time that was dry.  Patient's daughter brings him in today because she thinks the foot looks much worse than when she saw him last in May.  On exam he has what appears to be dry gangrene involving the first through third toes  extending to the midfoot.  Patient denies any associated pain he has dopplerable DP and PT pulses.  Patient sed rate is just minimally elevated to 25 he has no elevated white count lactate is normal.  Notably hypokalemic with potassium 2.6.  Patient has history of end-stage renal disease gets dialyzed Monday Wednesday Friday I only gave him some gentle p.o. repletion given he is ESRD.  This  looks to me like stable dry gangrene, but is fairly severe.  I did call to the patient's facility and they say that the foot has looked like this for couple months.  Patient's daughter very uncomfortable with the overall appearance and is worried about him getting systemically unwell.  I discussed with Dr. Trula Slade with vascular as well as Dr. Vickki Muff with podiatry.  Per Dr. Vickki Muff they would want vascular to weigh in first.  Dr. Trula Slade looked at the foot pictures does agree is quite extensive and will likely need a BKA but will defer to Dr. Due to may want to repeat an angiogram.  I think together with patient's significant comorbidities family comfort and difficulty arranging follow-up as well as how extensive the gangrene is I will admit the patient for further evaluation and management.      FINAL CLINICAL IMPRESSION(S) / ED DIAGNOSES   Final diagnoses:  Dry gangrene ([name])     Rx / DC Orders   ED Discharge Orders     None        Note:  This document was prepared using Dragon voice recognition software and may include unintentional dictation errors.   Rada Hay, MD 06/02/22 1745

## 2022-06-02 NOTE — H&P (Signed)
History and Physical    Ernest [name] KPT:[id number] DOB: 1945/12/04 DOA: 06/02/2022  PCP: Brayton Mars, MD  Patient coming from: [facility]  Chief Complaint: Dry gangrene of foot  HPI: Ernest [name] is a 76 y.o. male with medical history significant of CHF, COPD, CAD, Aflutter on eliquis, ESRD on HD MWF, DM2, h/o stroke with late seizure on keppra, HLD, HTN who presents for dry gangrene of the left foot.  Ernest [name] is hard of hearing, answers most questions appropriately.  Daughter, [name], is present and provides history.  Per [name], she has not seen the patient since May of this year.  When she came to visit, she was concerned that his foot with known dry gangrene was much worse than the last time she saw it and she was concerned.  Ernest [name] reports no pain, no fever, chills or other symptoms.  He has HD through the right fistula without issue.  He notes no abdominal pain, nausea or other findings.  EDP contacted SNF and report there is that the foot has looked largely unchanged in the month that Ernest [name] has been there.  He is s/p right AKA due to PVD.  He follows with Dr. Lucky Cowboy in vascular.    Last visit with Dr. Lucky Cowboy: 05/01/22 - showed continued arterial flow and no change to gangrene, planned for follow up in 3-4 months  ED Course: In the ED, patient was noted to have a mildly low Na, low K at 2.6, replaced modestly with 30mq of KCL given ESRD, Alb of 2.2.  Dry gangrene noted in the left foot to midfoot.  EDP discussed with Dr. FVickki Muffin Podiatry and Dr. BTrula Sladein Vascular.  Noted, BKA was likely to be needed and Dr. DLucky Cowboywill need to be consulted.  Daughter would like opinion of surgery team.   Review of Systems: As per HPI otherwise all other systems reviewed and are negative.  Patient states no to all questions.    Past Medical History:  Diagnosis Date   Abnormal stress test    s/p cath November 2013 with modest disease involving the ostial left main, proximal  LAD, proximal RCA - do not appear to be hemodynamically signficant; mild LV dysfunction   Anemia    low iron   Anxiety    Arthritis    Bacteremia    Chronic systolic CHF (congestive heart failure) (HCC)    COPD (chronic obstructive pulmonary disease) ([place]    Coronary artery disease    a. per cath report 2013, b. 09/13/2017-CABG X4 & Mitral valve repair   Depression    DVT (deep venous thrombosis) (HCC)    arm - right- finished blood thinner- not sure when it   Ejection fraction < 50%    Erectile dysfunction    penile implant   ES[place] ([place] stage renal disease) [place]    [facility] on a MWF schedule.      Gout    Hx: of   Hepatitis C    History of seizures    last one 2018   Hyperlipidemia    Hypertension    Mitral valve disease    s/p Mitral repair 09/13/17   Obesity    Retinopathy    Shortness of breath    Hx: of with exertion   Stroke ([facility]    "several" left side weakness, speech affected   Type II or unspecified type diabetes mellitus without mention of complication, not stated as uncontrolled  adult onset    Past Surgical History:  Procedure Laterality Date   A/V FISTULAGRAM N/A 10/04/2017   Procedure: A/V FISTULAGRAM;  Surgeon: Fields, Charles E, MD;  [facility];  Service: Cardiovascular;  Laterality: N/A;   A/V SHUNTOGRAM Left 07/12/2020   Procedure: A/V SHUNTOGRAM;  Surgeon: Brabham, Vance W, MD;  [facility];  Service: Cardiovascular;  Laterality: Left;   AMPUTATION Right 07/08/2021   Procedure: AMPUTATION ABOVE KNEE;  Surgeon: Antezana, James, MD;  Location: [facility] ORS;  Service: Vascular;  Laterality: Right;   ANGIOPLASTY Left 09/24/2019   Procedure: Angioplasty Innominate Vien;  Surgeon: Brabham, Vance W, MD;  [facility];  Service: Vascular;  Laterality: Left;   AV FISTULA PLACEMENT Left 01/13/2013   Procedure: INSERTION OF ARTERIOVENOUS (AV) GORE-TEX GRAFT ARM;  Surgeon: Christopher S Dickson, MD;  [facility];  Service:  Vascular;  Laterality: Left;   AV FISTULA PLACEMENT Left 05/05/2013   Procedure: INSERTION OF LEFT UPPER ARM  ARTERIOVENOUS GORTEX GRAFT;  Surgeon: Christopher S Dickson, MD;  [facility];  Service: Vascular;  Laterality: Left;   AV FISTULA PLACEMENT Left 11/26/2019   Procedure: INSERTION OF ARTERIOVENOUS (AV) GORE-TEX GRAFT, left  ARM;  Surgeon: Brabham, Vance W, MD;  [facility];  Service: Vascular;  Laterality: Left;   AVGG REMOVAL Left 03/26/2013   Procedure: REMOVAL OF  NON INCORPORATED ARTERIOVENOUS GORETEX GRAFT (AVGG) left arm * repair of  left brachial artery with vein patch angioplasty.;  Surgeon: Christopher S Dickson, MD;  [facility];  Service: Vascular;  Laterality: Left;   CARDIAC CATHETERIZATION  August 26, 2012   CATARACT EXTRACTION W/ INTRAOCULAR LENS  IMPLANT, BILATERAL     COLONOSCOPY     CORONARY ARTERY BYPASS GRAFT N/A 09/13/2017   Procedure: CORONARY ARTERY BYPASS GRAFTING (CABG) times four LIMA  to LAD, SVG sequentially to OM and RAMUS Intermediate and SVG to Acute Marginal;  Surgeon: Hendrickson, Steven C, MD;  [facility];  Service: Open Heart Surgery;  Laterality: N/A;   DIALYSIS/PERMA CATHETER INSERTION N/A 02/28/2021   Procedure: DIALYSIS/PERMA CATHETER EXCHANGE;  Surgeon: Schnier, Gregory G, MD;  Location: [facility] INV[facility]IVE CV LAB;  Service: Cardiovascular;  Laterality: N/A;   EMBOLECTOMY Right 08/27/2013   Procedure: EMBOLECTOMY;  Surgeon: James D Lawson, MD;  [facility];  Service: Vascular;  Laterality: Right;  Thrombectomy of Radial and ulnar artery.   ENDOVASCULAR STENT INSERTION Left 09/24/2019   Procedure: Endovascular Stent Graft Insertion, Arm Graft;  Surgeon: Brabham, Vance W, MD;  [facility];  Service: Vascular;  Laterality: Left;   EYE SURGERY     laser.  and surgery for DM   fistula     RUE and wrist   FISTULOGRAM Left 09/24/2019   Procedure: SHUNTOGRAM OF ARTERIOVENOUS FISTULA LEFT ARM,;  Surgeon: Brabham, Vance W, MD;  [facility];  Service: Vascular;  Laterality: Left;   INSERTION OF DIALYSIS CATHETER Right 01/01/2013   Procedure: INSERTION OF DIALYSIS CATHETER right  internal jugular;  Surgeon: Vance W Brabham, MD;  [facility];  Service: Vascular;  Laterality: Right;   INSERTION OF DIALYSIS CATHETER N/A 03/26/2013   Procedure: INSERTION OF DIALYSIS CATHETER Left internal jugular vein;  Surgeon: Christopher S Dickson, MD;  [facility];  Service: Vascular;  Laterality: N/A;   INSERTION OF DIALYSIS CATHETER Right 11/26/2019   Procedure: INSERTION OF DIALYSIS CATHETER, right internal jugular;  Surgeon: Brabham, Vance W, MD;  [facility];    Service: Vascular;  Laterality: Right;   IR THORACENTESIS ASP PLEURAL SPACE W/IMG GUIDE  09/30/2019   LEFT HEART CATH AND CORONARY ANGIOGRAPHY N/A 09/09/2017   Procedure: LEFT HEART CATH AND CORONARY ANGIOGRAPHY;  Surgeon: Troy Sine, MD;  Location: [place] CV LAB;  Service: Cardiovascular;  Laterality: N/A;   LIGATION OF ARTERIOVENOUS  FISTULA Right 12/30/2013   Procedure: REMOVAL OF SEGMENT OF GORTEX GRAFT AND FISTULA  AND REPAIR OF BRACHIAL ARTERY;  Surgeon: Mal Misty, MD;  Location: [place];  Service: Vascular;  Laterality: Right;   LOWER EXTREMITY ANGIOGRAPHY N/A 06/28/2021   Procedure: Lower Extremity Angiography;  Surgeon: Katha Cabal, MD;  Location: [place] CV LAB;  Service: Cardiovascular;  Laterality: N/A;   LOWER EXTREMITY ANGIOGRAPHY Left 11/23/2021   Procedure: LOWER EXTREMITY ANGIOGRAPHY;  Surgeon: Algernon Huxley, MD;  Location: [place] CV LAB;  Service: Cardiovascular;  Laterality: Left;   MITRAL VALVE REPAIR N/A 09/13/2017   Procedure: MITRAL VALVE REPAIR (MVR);  Surgeon: Melrose Nakayama, MD;  Location: [place];  Service: Open Heart Surgery;  Laterality: N/A;   MITRAL VALVE REPAIR (MV)/CORONARY ARTERY BYPASS GRAFTING (CABG)  09/13/2017   PENILE PROSTHESIS IMPLANT     1997.. no card   PERIPHERAL VASCULAR BALLOON ANGIOPLASTY Left  10/04/2017   Procedure: PERIPHERAL VASCULAR BALLOON ANGIOPLASTY;  Surgeon: Elam Dutch, MD;  Location: [place] CV LAB;  Service: Cardiovascular;  Laterality: Left;  AVF   PERIPHERAL VASCULAR BALLOON ANGIOPLASTY Left 07/12/2020   Procedure: PERIPHERAL VASCULAR BALLOON ANGIOPLASTY;  Surgeon: Serafina Mitchell, MD;  Location: [place] CV LAB;  Service: Cardiovascular;  Laterality: Left;  ARM SHUNTOGRAM   REVISION OF ARTERIOVENOUS GORETEX GRAFT Left 09/24/2019   Procedure: REVISION OF ARTERIOVENOUS GORETEX GRAFT LEFT ARM;  Surgeon: Serafina Mitchell, MD;  [facility];  Service: Vascular;  Laterality: Left;   TEE WITHOUT CARDIOVERSION N/A 06/11/2013   Procedure: TRANSESOPHAGEAL ECHOCARDIOGRAM (TEE);  Surgeon: Larey Dresser, MD;  Location: [facility] ENDOSCOPY;  Service: Cardiovascular;  Laterality: N/A;   TEE WITHOUT CARDIOVERSION N/A 09/13/2017   Procedure: TRANSESOPHAGEAL ECHOCARDIOGRAM (TEE);  Surgeon: Melrose Nakayama, MD;  Location: [place];  Service: Open Heart Surgery;  Laterality: N/A;   THROMBECTOMY W/ EMBOLECTOMY Left 03/26/2013   Procedure: Attempted thrombectomy of left arm arteriovenous goretex graft.;  Surgeon: Angelia Mould, MD;  Location: [facility];  Service: Vascular;  Laterality: Left;   ULTRASOUND GUIDANCE FOR VASCULAR ACCESS Bilateral 09/24/2019   Procedure: Ultrasound Guidance For Vascular Access, Right Femoral Vein and Left Arm Graft;  Surgeon: Serafina Mitchell, MD;  Location: [facility] OR;  Service: Vascular;  Laterality: Bilateral;   VENOGRAM N/A 04/07/2013   Procedure: VENOGRAM;  Surgeon: Serafina Mitchell, MD;  [facility]: [facility] CATH LAB;  Service: Cardiovascular;  Laterality: N/A;    Social History  reports that he quit smoking about 49 years ago. His smoking use included cigarettes. He has never used smokeless tobacco. He reports that he does not drink alcohol and does not use drugs.  [name]  Family History  Problem Relation Age of Onset   Heart disease  Father    Diabetes Sister    Diabetes Brother    Diabetes Son    These medications were present on last office visit 1 month ago, awaiting further information from nursing home.  Prior to Admission medications   Medication Sig Start Date End Date Taking? Authorizing Provider  acetaminophen (TYLENOL) 500 MG tablet Take 1,000 mg by mouth  every 6 (six) hours as needed for mild pain.    [provider]  amLODipine (NORVASC) 5 MG tablet Take 1 tablet (5 mg total) by mouth daily. 09/28/21   Patel, Sona, MD  apixaban (ELIQUIS) 5 MG TABS tablet Take 1 tablet (5 mg total) by mouth 2 (two) times daily. 03/09/22 05/08/22  End, Christopher, MD  atorvastatin (LIPITOR) 40 MG tablet Take 40 mg by mouth at bedtime.    [provider]  DULoxetine (CYMBALTA) 20 MG capsule Take 40 mg by mouth 2 (two) times daily.    [provider]  levETIRAcetam (KEPPRA) 500 MG tablet Take 1 tablet (500 mg total) by mouth 2 (two) times daily. Only on Tuesday, Thursday, Saturday, Sunday 10/26/21   Jaffe, Adam R, DO  levETIRAcetam (KEPPRA) 500 MG tablet Take 1 tablet (500 mg total) by mouth 3 (three) times daily. On MWF - administer lunchtime dose once returns from dialysis 10/26/21   Jaffe, Adam R, DO  multivitamin (RENA-VIT) TABS tablet Take 1 tablet by mouth at bedtime. 09/28/21   Patel, Sona, MD  pantoprazole (PROTONIX) 40 MG tablet Take 1 tablet (40 mg total) by mouth daily. Patient taking differently: Take 40 mg by mouth at bedtime. 07/21/21   Swayze, Ava, DO  polyethylene glycol (MIRALAX / GLYCOLAX) 17 g packet Take 17 g by mouth daily. Dissolve in 4-8 ounces of water    [provider]  senna-docusate (SENOKOT-S) 8.6-50 MG tablet Take 1 tablet by mouth at bedtime.    [provider]  sevelamer carbonate (RENVELA) 800 MG tablet Take 800 mg by mouth 3 (three) times daily with meals. On Monday, Wednesday, and Friday. Give NOON dose on MWF asap when returning from dialysis    [provider]    Physical Exam: Vitals:   06/02/22 1516 06/02/22 1519  BP:  108/66  Pulse:  93  Resp:  17  Temp:  97.8 F (36.6 C)  TempSrc:  Oral  SpO2:  97%  Weight: 66 kg   Height: 5' 8" (1.727 m)     Constitutional: NAD, calm, comfortable, sleeping, awake to voice.  Eyes: lids and conjunctivae normal, some mild tearing of bilaterally ENMT: Mucous membranes are mildly dry Neck: normal, supple Respiratory: CTAB, no wheezing, no crackles Cardiovascular: RR, NR, no murmur.  DP and PT are non palpable to touch, apparently were found by doppler in the ED.  No edema.  He has dry gangrene of 3 toes in the foot.  S/p amputation of others.   Abdomen: +BS, NT, ND Musculoskeletal: He is s/p AKA on the right, dry gangrene on the left.  Skin: Gangrene as noted, see pictures, he has no other rashes on exposed skin.  He has a small wound at the upper gluteal cleft which is POA.  Will ask wound care to see.  Neurologic: Slow to answer questions, hard of hearing, delayed movements, but able to roll in bed to the right.  Speech is garbled, however, he is edentulous.  This appears to be baseline Psychiatric: Somnolent, alert to voice.   See pictures in Chart Review.   Labs on Admission: I have personally reviewed following labs and imaging studies  CBC: Recent Labs  Lab 06/02/22 1526  WBC 8.3  NEUTROABS 6.2  HGB 11.6*  HCT 37.3*  MCV 80.4  PLT 269    Basic Metabolic Panel: Recent Labs  Lab 06/02/22 1526  NA 134*  K 2.6*  CL 97*  CO2 28  GLUCOSE 170*    BUN 26*  CREATININE 2.94*  CALCIUM 7.4*    GFR: Estimated Creatinine Clearance: 20.9 mL/min (A) (by C-G formula based on SCr of 2.94 mg/dL (H)).  Liver Function Tests: Recent Labs  Lab 06/02/22 1526  AST 16  ALT 9  ALKPHOS 85  BILITOT 0.9  PROT 6.2*  ALBUMIN 2.2*    Radiological Exams on Admission: No results found.  EKG: Independently reviewed. Sinus with PVCs  Assessment/Plan Dry gangrene  (HCC) Atherosclerosis of native arteries of the extremities with gangrene (HCC) Acquired absence of right leg above knee ([place]) - Patient presents with chronic dry gangrene of the left foot, pictures in Chart Review - Recently saw Vascular who were comfortable monitoring outpatient - Family is concerned regarding allowing the leg to continue this way - ESR elevated, no clear sign of infection; CRP pending - Vascular surgery will consult, they should see in the AM, call if not seen - He is not in pain, tylenol PRN in case pain occurs - Abx not started, monitor for fever  End-stage renal disease on hemodialysis ([place]) Anemia of chronic disease Secondary hyperparathyroidism of renal origin ([place]) Hypokalemia - K 2.6, 56mq provided - Check in the AM - Consult nephrology in the AM for HD - HD MWF - Continue rena-vit - Continue sevalemer   Pressure injury of skin - Per daughter, new since being in the SNF - wound care consult for staging - Present on admission  Type 2 diabetes mellitus  - Currently on no therapy at home - SSI    Chronic HFrEF (heart failure with reduced ejection fraction) ([place] - Euvolemic on exam - Volume handled with HD - Continue amlodipine, atorvastatin    History of CVA (cerebrovascular accident) Seizure, late effect of stroke ([place] - Continue keppra, amlodipine, atorvastatin    Essential hypertension - Continue amlodipine, HD    Vascular dementia ([place] - Monitor for sundowning - Delirium precautions    Atrial flutter ([place] - Continue eliquis     DVT prophylaxis: Eliquis (home medication) Code Status:   Full, confirmed with daughter  Family Communication:  Daughter Heather at bedside, [phone] Disposition Plan:   [place]  Anticipated DC date:  06/06/22  Anticipated DC barriers: Decision about surgery need  Consults called:  Dr. FVickki Muff Podiatry; Dr. BTrula Slade Vascular.  Will need  Nephrology Admission status:  IP, med surg  Severity of Illness: The appropriate patient status for this patient is INPATIENT. Inpatient status is judged to be reasonable and necessary in order to provide the required intensity of service to ensure the patient's safety. The patient's presenting symptoms, physical exam findings, and initial radiographic and laboratory data in the context of their chronic comorbidities is felt to place them at high risk for further clinical deterioration. Furthermore, it is not anticipated that the patient will be medically stable for discharge from the hospital within 2 midnights of admission.   * I certify that at the point of admission it is my clinical judgment that the patient will require inpatient hospital care spanning beyond 2 midnights from the point of admission due to high intensity of service, high risk for further deterioration and high frequency of surveillance required.*Gilles ChiquitoMD [facility]  How to contact the [facility] Center[facility]ending or Consulting provider [place] covering provider during after hours [place] for this patient?   Check the care team in [facility]and look  for a) attending/consulting [place] provider listed and b) the [facility] team listed Log into [email or web address] and use [place]'s universal password to access. If you do not have the password, please contact the hospital operator. Locate the [facility] provider you are looking for under [facility] and page to a number that you can be directly reached. If you still have difficulty reaching the provider, please page the [facility] (Director on Call) for the Hospitalists listed on amion for assistance.  06/02/2022, 6:40 PM

## 2022-06-02 NOTE — ED Triage Notes (Signed)
First Nurse Note;  Pt via EMS from [facility]. Daughter called 26, pt L foot is completely black per EMS, to the bone. Pt is alert, pt has a hx of stroke and has a hx of aphasia. Pt is dialysis pt, no hx of diabetes.   110/73 50-120 with hx of a fib 98% on 2L [place]

## 2022-06-02 NOTE — ED Notes (Signed)
Request made for transport to the floor ?

## 2022-06-02 NOTE — Progress Notes (Signed)
Patient arrived to 1C room 8 oriented x4. No distress noted with 2L O2 via [place]. Patient oriented to staff and unit and equipment. Safety precautions in place. Call light within reach. Safety education provided. Plan of care reviewed with patient. Will continue to monitor and endorse.

## 2022-06-02 NOTE — ED Triage Notes (Signed)
Pt daughter reports pt had surgery recently to try and get the circulation working in his left leg but not sure how it worked. Daughter sates got a call from family that his leg looked bad, so got a flight into town and when she saw it knew he needed to be seen

## 2022-06-03 DIAGNOSIS — F0150 Vascular dementia without behavioral disturbance: Secondary | ICD-10-CM

## 2022-06-03 DIAGNOSIS — R7301 Impaired fasting glucose: Secondary | ICD-10-CM | POA: Insufficient documentation

## 2022-06-03 DIAGNOSIS — I69398 Other sequelae of cerebral infarction: Secondary | ICD-10-CM

## 2022-06-03 DIAGNOSIS — I96 Gangrene, not elsewhere classified: Secondary | ICD-10-CM

## 2022-06-03 DIAGNOSIS — N2581 Secondary hyperparathyroidism of renal origin: Secondary | ICD-10-CM

## 2022-06-03 DIAGNOSIS — L89152 Pressure ulcer of sacral region, stage 2: Secondary | ICD-10-CM

## 2022-06-03 DIAGNOSIS — Z992 Dependence on renal dialysis: Secondary | ICD-10-CM

## 2022-06-03 DIAGNOSIS — E876 Hypokalemia: Secondary | ICD-10-CM

## 2022-06-03 DIAGNOSIS — D638 Anemia in other chronic diseases classified elsewhere: Secondary | ICD-10-CM

## 2022-06-03 DIAGNOSIS — N186 End stage renal disease: Secondary | ICD-10-CM | POA: Diagnosis not present

## 2022-06-03 DIAGNOSIS — I5022 Chronic systolic (congestive) heart failure: Secondary | ICD-10-CM

## 2022-06-03 DIAGNOSIS — R569 Unspecified convulsions: Secondary | ICD-10-CM

## 2022-06-03 LAB — C-REACTIVE PROTEIN: CRP: 11.9 mg/dL — ABNORMAL HIGH (ref <1.0)

## 2022-06-03 LAB — CBC
HCT: 36.9 % — ABNORMAL LOW (ref 39.0–52.0)
Hemoglobin: 11.4 g/dL — ABNORMAL LOW (ref 13.0–17.0)
MCH: 24.6 pg — ABNORMAL LOW (ref 26.0–34.0)
MCHC: 30.9 g/dL (ref 30.0–36.0)
MCV: 79.5 fL — ABNORMAL LOW (ref 80.0–100.0)
Platelets: 251 10*3/uL (ref 150–400)
RBC: 4.64 MIL/uL (ref 4.22–5.81)
RDW: 18.9 % — ABNORMAL HIGH (ref 11.5–15.5)
WBC: 8 10*3/uL (ref 4.0–10.5)
nRBC: 0 % (ref 0.0–0.2)

## 2022-06-03 LAB — GLUCOSE, CAPILLARY
Glucose-Capillary: 66 mg/dL — ABNORMAL LOW (ref 70–99)
Glucose-Capillary: 122 mg/dL — ABNORMAL HIGH (ref 70–99)
Glucose-Capillary: 125 mg/dL — ABNORMAL HIGH (ref 70–99)
Glucose-Capillary: 153 mg/dL — ABNORMAL HIGH (ref 70–99)
Glucose-Capillary: 157 mg/dL — ABNORMAL HIGH (ref 70–99)

## 2022-06-03 LAB — HEMOGLOBIN A1C
Hgb A1c MFr Bld: 4.9 % (ref 4.8–5.6)
Mean Plasma Glucose: 93.93 mg/dL

## 2022-06-03 LAB — BASIC METABOLIC PANEL
Anion gap: 9 (ref 5–15)
BUN: 32 mg/dL — ABNORMAL HIGH (ref 8–23)
CO2: 28 mmol/L (ref 22–32)
Calcium: 7.9 mg/dL — ABNORMAL LOW (ref 8.9–10.3)
Chloride: 98 mmol/L (ref 98–111)
Creatinine, Ser: 3.46 mg/dL — ABNORMAL HIGH (ref 0.61–1.24)
GFR, Estimated: 18 mL/min — ABNORMAL LOW (ref >60)
Glucose, Bld: 80 mg/dL (ref 70–99)
Potassium: 3.1 mmol/L — ABNORMAL LOW (ref 3.5–5.1)
Sodium: 135 mmol/L (ref 135–145)

## 2022-06-03 MED ORDER — ALTEPLASE 2 MG IJ SOLR: 2.0000 mg | Freq: Once | INTRAMUSCULAR | Status: DC | PRN

## 2022-06-03 MED ORDER — ANTICOAGULANT SODIUM CITRATE 4% (200MG/5ML) IV SOLN: 5.0000 mL | Status: DC | PRN

## 2022-06-03 MED ORDER — PENTAFLUOROPROP-TETRAFLUOROETH EX AERO: 1.0000 | INHALATION_SPRAY | CUTANEOUS | Status: DC | PRN

## 2022-06-03 MED ORDER — CHLORHEXIDINE GLUCONATE CLOTH 2 % EX PADS
6.0000 | MEDICATED_PAD | Freq: Every day | CUTANEOUS | Status: DC
  Administered 2022-06-03 – 2022-06-18 (×13): 6 via TOPICAL

## 2022-06-03 MED ORDER — LIDOCAINE HCL (PF) 1 % IJ SOLN: 5.0000 mL | INTRAMUSCULAR | Status: DC | PRN

## 2022-06-03 MED ORDER — LIDOCAINE-PRILOCAINE 2.5-2.5 % EX CREA: 1.0000 | TOPICAL_CREAM | CUTANEOUS | Status: DC | PRN

## 2022-06-03 MED ORDER — HEPARIN SODIUM (PORCINE) 1000 UNIT/ML DIALYSIS
1000.0000 [IU] | INTRAMUSCULAR | Status: DC | PRN
  Administered 2022-06-06: 1000 [IU]
  Filled 2022-06-03: qty 1

## 2022-06-03 NOTE — Assessment & Plan Note (Addendum)
Dialysis patient

## 2022-06-03 NOTE — Assessment & Plan Note (Addendum)
Hemoglobin 11.1-> 7.1 Unsure if this acute drop is due to recent procedure/surgery related blood loss We will transfuse 1 PRBC for hemoglobin of 7.1

## 2022-06-03 NOTE — Assessment & Plan Note (Addendum)
On IV Keppra

## 2022-06-03 NOTE — Progress Notes (Signed)
[facility]  ROUNDING NOTE   Subjective:   Ernest [name] is a 76 year old African-American male with past medical conditions including CAD, COPD, CHF, diabetes, stroke with late seizures, hypertension, and end-stage renal disease on hemodialysis.  Patient presents to the emergency department from [facility] with concerns of left foot wound and has been admitted for Dry gangrene [facility]) [I96]  Patient is known to our practice from previous admissions and receives outpatient dialysis treatments at [facility] on a MWF schedule, supervised by [facility] physicians.  Patient did complete treatment on Friday without complication.  Patient is seen sitting up in bed, nurse assisting with breakfast.  Patient states he is in hospital for his foot.  Denies pain or discomfort.  States he maintains appropriate appetite.  No lower extremity edema.  Remains on room air.  Labs on ED arrival include sodium 134, potassium 2.6, glucose 170, BUN 26, creatinine 2.94 with GFR 22.  Hemoglobin 11.6.  Blood cultures pending.   Objective:  Vital signs in last 24 hours:  Temp:  [97.5 F (36.4 C)-98 F (36.7 C)] 97.5 F (36.4 C) (08/20 0909) Pulse Rate:  [91-106] 104 (08/20 0909) Resp:  [16-20] 20 (08/20 0909) BP: (102-108)/(62-71) 104/67 (08/20 0909) SpO2:  [92 %-100 %] 92 % (08/20 0909) Weight:  [57.9 kg-66 kg] 57.9 kg (08/19 2026)  Weight change:  Filed Weights   06/02/22 1516 06/02/22 2026  Weight: 66 kg 57.9 kg    Intake/Output: I/O last 3 completed shifts: In: 120 [P.O.:120] Out: -    Intake/Output this shift:  No intake/output data recorded.  Physical Exam: General: NAD  Head: Normocephalic, atraumatic. Moist oral mucosal membranes  Eyes: Anicteric  Neck: Supple  Lungs:  Clear to auscultation, normal effort, room air  Heart: Regular rate and rhythm  Abdomen:  Soft, nontender, nondistended  Extremities: No peripheral edema.  Neurologic: Nonfocal, moving all four  extremities  Skin: necrotic lesions lateral left foot  Access: Right thigh PermCath    Basic Metabolic Panel: Recent Labs  Lab 06/02/22 1526 06/03/22 0534  NA 134* 135  K 2.6* 3.1*  CL 97* 98  CO2 28 28  GLUCOSE 170* 80  BUN 26* 32*  CREATININE 2.94* 3.46*  CALCIUM 7.4* 7.9*    Liver Function Tests: Recent Labs  Lab 06/02/22 1526  AST 16  ALT 9  ALKPHOS 85  BILITOT 0.9  PROT 6.2*  ALBUMIN 2.2*   No results for input(s): "LIPASE", "AMYLASE" in the last 168 hours. No results for input(s): "AMMONIA" in the last 168 hours.  CBC: Recent Labs  Lab 06/02/22 1526 06/03/22 0534  WBC 8.3 8.0  NEUTROABS 6.2  --   HGB 11.6* 11.4*  HCT 37.3* 36.9*  MCV 80.4 79.5*  PLT 269 251    Cardiac Enzymes: No results for input(s): "CKTOTAL", "CKMB", "CKMBINDEX", "TROPONINI" in the last 168 hours.  BNP: Invalid input(s): "POCBNP"  CBG: Recent Labs  Lab 06/02/22 2054 06/03/22 0840 06/03/22 1006 06/03/22 1133  GLUCAP 116* 66* 122* 125*    Microbiology: Results for orders placed or performed during the hospital encounter of 06/02/22  Blood culture (routine x 2)     Status: None (Preliminary result)   Collection Time: 06/02/22  4:24 PM   Specimen: BLOOD  Result Value Ref Range Status   Specimen Description BLOOD LEFT ANTECUBITAL  Final   Special Requests   Final    BOTTLES DRAWN AEROBIC AND ANAEROBIC Blood Culture results may not be optimal due to an  inadequate volume of blood received in culture bottles   Culture   Final    NO GROWTH < 12 HOURS Performed at [facility], [address] [place], [place] [address]    Report Status PENDING  Incomplete  Blood culture (routine x 2)     Status: None (Preliminary result)   Collection Time: 06/02/22  8:45 PM   Specimen: BLOOD LEFT HAND  Result Value Ref Range Status   Specimen Description BLOOD LEFT HAND  Final   Special Requests   Final    BOTTLES DRAWN AEROBIC AND ANAEROBIC Blood Culture adequate volume    Culture   Final    NO GROWTH < 12 HOURS Performed at [facility], [address], [place], [place] [address]    Report Status PENDING  Incomplete    Coagulation Studies: No results for input(s): "LABPROT", "INR" in the last 72 hours.  Urinalysis: No results for input(s): "COLORURINE", "LABSPEC", "PHURINE", "GLUCOSEU", "HGBUR", "BILIRUBINUR", "KETONESUR", "PROTEINUR", "UROBILINOGEN", "NITRITE", "LEUKOCYTESUR" in the last 72 hours.  Invalid input(s): "APPERANCEUR"    Imaging: No results found.   Medications:     amLODipine  5 mg Oral Daily   apixaban  2.5 mg Oral BID   atorvastatin  40 mg Oral Daily   Chlorhexidine Gluconate Cloth  6 each Topical Daily   DULoxetine  40 mg Oral BID   insulin aspart  0-6 Units Subcutaneous TID WC   levETIRAcetam  500 mg Oral 2 times per day on Sun Tue Thu Sat   [START ON 06/04/2022] levETIRAcetam  500 mg Oral 3 times per day on Mon Wed Fri   multivitamin  1 tablet Oral QHS   pantoprazole  40 mg Oral Daily   senna-docusate  1 tablet Oral QHS   sevelamer carbonate  800 mg Oral TID WC   acetaminophen, ondansetron **OR** ondansetron (ZOFRAN) IV  Assessment/ Plan:  [name] is a 76 y.o.  male  with past medical conditions including CAD, COPD, CHF, diabetes, stroke with late seizures, hypertension, and end-stage renal disease on hemodialysis.  Patient presents to the emergency department from [facility] with concerns of left foot wound and has been admitted for 06/02/2022  3:25 PM   End-stage renal disease on hemodialysis.  Last treatment completed on Friday.  Will maintain outpatient schedule, if possible.  Plan to dialyze patient on Monday.  2. Anemia of chronic kidney disease Lab Results  Component Value Date   HGB 11.4 (L) 06/03/2022    Hemoglobin within acceptable range.  Patient receives [facility] outpatient  3. Secondary Hyperparathyroidism: with outpatient labs: PTH 534, phosphorus 4.5, calcium 8.0 on 05/21/2022.    Lab Results  Component Value Date   PTH 63 10/05/2017   PTH Comment 10/05/2017   CALCIUM 7.9 (L) 06/03/2022   CAION 1.05 (L) 07/12/2020   PHOS 3.9 09/26/2021    Phosphorus within acceptable range however calcium decreased.  Continue sevelamer with meals.  4.  Hypertension with chronic kidney disease.  Home regimen includes amlodipine.  Currently prescribed this.  5.  Dry gangrene, suspected on left foot.  Vascular surgery consulted.   LOS: 1 [place] 8/20/202312:40 PM

## 2022-06-03 NOTE — Assessment & Plan Note (Addendum)
Wound care consultation.  Stage II gluteal cleft

## 2022-06-03 NOTE — Assessment & Plan Note (Addendum)
Gangrene left foot.  Status post left AKA on 8/25

## 2022-06-03 NOTE — Assessment & Plan Note (Signed)
Replaced on admission I will not replace any further being that the patient is a dialysis patient.

## 2022-06-03 NOTE — Progress Notes (Signed)
  Progress Note   Patient: Ernest [name] GQQ:[id number] DOB: Apr 16, 1946 DOA: 06/02/2022     1 DOS: the patient was seen and examined on 06/03/2022     Assessment and Plan: * Dry gangrene ([place]) Gangrene left foot.  Vascular consultation.  Likely will end up needing an amputation.  Case discussed with vascular surgeon recommended holding Eliquis tonight.  End-stage renal disease on hemodialysis [facility]) Nephrology consultation for hemodialysis tomorrow.  Vascular dementia [facility]) Patient answers a few questions appropriately.  Pressure injury of skin Wound care consultation.  Stage II gluteal cleft.  Secondary hyperparathyroidism of renal origin ([place]) PTH 534, phosphorus 4.5 and calcium 8.0 on outpatient labs.  Hypokalemia Replaced on admission I will not replace any further being that the patient is a dialysis patient.  Impaired fasting glucose Hemoglobin A1c low at 4.7.  Patient is not a diabetic.  Discontinue fingersticks.  Anemia of chronic disease Hemoglobin 11.4  Seizure, late effect of stroke ([place]) On Keppra.  Chronic HFrEF (heart failure with reduced ejection fraction) (HCC) No signs of heart failure.  Dialysis to remove fluid       Subjective: Patient feels okay.  Offers no complaints.  No pain in his left foot.  Brought in by family for gangrene of his foot.  Physical Exam: Vitals:   06/02/22 2041 06/03/22 0108 06/03/22 0328 06/03/22 0909  BP: 107/71 103/64 102/62 104/67  Pulse: (!) 106 98 91 (!) 104  Resp: 16 17 20 20   Temp: 97.7 F (36.5 C) 98 F (36.7 C) 97.8 F (36.6 C) (!) 97.5 F (36.4 C)  TempSrc:  Oral Oral   SpO2: 100% 100% 100% 92%  Weight:      Height:       Physical Exam HENT:     Head: Normocephalic.     Mouth/Throat:     Pharynx: No oropharyngeal exudate.  Eyes:     General: Lids are normal.     Conjunctiva/sclera: Conjunctivae normal.  Cardiovascular:     Rate and Rhythm: Normal rate and regular rhythm.     Heart sounds: Normal  heart sounds, S1 normal and S2 normal.  Pulmonary:     Breath sounds: No decreased breath sounds, wheezing, rhonchi or rales.  Abdominal:     Palpations: Abdomen is soft.     Tenderness: There is no abdominal tenderness.  Musculoskeletal:     Left lower leg: Swelling present.     Right Lower Extremity: Right leg is amputated above knee.  Skin:    General: Skin is warm.     Comments: Gangrene left foot.  See picture below.  Neurological:     Mental Status: He is alert.     Comments: Answers a few simple questions.      Data Reviewed: White blood cell count 8.0, platelet count 251, hemoglobin 11.4, blood cultures negative for 12 hours, potassium 3.1, creatinine 3.46, GFR 18, hemoglobin A1c 4.9  Family Communication: Updated patient's daughter on the phone  Disposition: Status is: Inpatient Remains inpatient appropriate because: Vascular surgery team to reevaluate tomorrow for potential amputation.  Planned Discharge Destination: Skilled nursing facility    Time spent: 28 minutes  Author: Loletha Grayer, MD 06/03/2022 1:30 PM  For on call review [email or web address].

## 2022-06-03 NOTE — Assessment & Plan Note (Addendum)
HD per nephro. He had refused last session.

## 2022-06-03 NOTE — Assessment & Plan Note (Signed)
Hemoglobin A1c low at 4.7.  Patient is not a diabetic.  Discontinue fingersticks.

## 2022-06-03 NOTE — Assessment & Plan Note (Addendum)
Well compensated. Cardio c/s for elevated troponin.  Will obtain 2D echo

## 2022-06-03 NOTE — Assessment & Plan Note (Addendum)
More stable for now he does get intermittent agitation

## 2022-06-03 NOTE — Consult Note (Signed)
[facility] [place]  Patient name: Ernest [name] MRN: [id number] DOB: 07-22-1946 Sex: male   REQUESTING PROVIDER:    ED   REASON FOR CONSULT:    Left foot wound  HISTORY OF PRESENT ILLNESS:   Ernest [name] is a 76 y.o. male, who was brought to the emergency department from his facility after she saw his foot for the first time in a while and felt that it did not look the way it used to.  He last saw Dr. Lucky Cowboy in July for dry gangrene of multiple toes on his left foot which had been stable.  He had a ultrasound that showed flow through his anterior tibial artery without stenosis.  His peroneal artery was also patent.  He did not have any appreciable digital vessel blood flow.  It was felt that this was consistent with his prior angiogram.  The plan was for letting that toes auto amputate.  The patient denies having any pain.  He has not been having any fevers or signs of infection.  He was admitted for further management  The patient is on dialysis.  He has a history of congestive heart failure as well as COPD.  He has known coronary artery disease and diabetes.  He takes Eliquis for atrial fibrillation.  He is on a statin for hypercholesterolemia    PAST MEDICAL HISTORY    Past Medical History:  Diagnosis Date   Abnormal stress test    s/p cath November 2013 with modest disease involving the ostial left main, proximal LAD, proximal RCA - do not appear to be hemodynamically signficant; mild LV dysfunction   Anemia    low iron   Anxiety    Arthritis    Bacteremia    Chronic systolic CHF (congestive heart failure) (HCC)    COPD (chronic obstructive pulmonary disease) ([place])    Coronary artery disease    a. per cath report 2013, b. 09/13/2017-CABG X4 & Mitral valve repair   Depression    DVT (deep venous thrombosis) (HCC)    arm - right- finished blood thinner- not sure when it   Ejection fraction < 50%    Erectile  dysfunction    penile implant   ESRD (end stage renal disease) ([place])    [facility] on a MWF schedule.      Gout    Hx: of   Hepatitis C    History of seizures    last one 2018   Hyperlipidemia    Hypertension    Mitral valve disease    s/p Mitral repair 09/13/17   Obesity    Retinopathy    Shortness of breath    Hx: of with exertion   Stroke ([place])    "several" left side weakness, speech affected   Type II or unspecified type diabetes mellitus without mention of complication, not stated as uncontrolled    adult onset     FAMILY HISTORY   Family History  Problem Relation Age of Onset   Heart disease Father    Diabetes Sister    Diabetes Brother    Diabetes Son     SOCIAL HISTORY:   Social History   Socioeconomic History   Marital status: Widowed    Spouse name: Not on file   Number of children: Not on file   Years of education: Not on file   Highest education level: Not on file  Occupational History   Occupation: Health visitor  Employer: DISABLED    Comment: disabled  Tobacco Use   Smoking status: Former    Years: 7.00    Types: Cigarettes    Quit date: 06/10/1973    Years since quitting: 49.0   Smokeless tobacco: Never   Tobacco comments:    Quit in 1974.  Vaping Use   Vaping Use: Never used  Substance and Sexual Activity   Alcohol use: No    Alcohol/week: 0.0 standard drinks of alcohol   Drug use: No   Sexual activity: Not on file  Other Topics Concern   Not on file  Social History Narrative   Adopted mentally retarded child at home, 2 adopted children, 3 living and 2 deceased.   Social Determinants of Health   Financial Resource Strain: Not on file  Food Insecurity: Not on file  Transportation Needs: Not on file  Physical Activity: Not on file  Stress: Not on file  Social Connections: Not on file  Intimate Partner Violence: Not on file    ALLERGIES:    [name]  CURRENT MEDICATIONS:    Current Facility-Administered  Medications  Medication Dose Route Frequency Provider Last Rate Last Admin   acetaminophen (TYLENOL) tablet 1,000 mg  1,000 mg Oral Q6H PRN Gilles Chiquito B, MD       alteplase (CATHFLO ACTIVASE) injection 2 mg  2 mg Intracatheter Once PRN Colon Flattery, NP       amLODipine (NORVASC) tablet 5 mg  5 mg Oral Daily Gilles Chiquito B, MD   5 mg at 06/03/22 1007   anticoagulant sodium citrate solution 5 mL  5 mL Intracatheter PRN Colon Flattery, NP       atorvastatin (LIPITOR) tablet 40 mg  40 mg Oral Daily Gilles Chiquito B, MD   40 mg at 06/03/22 1006   Chlorhexidine Gluconate Cloth 2 % PADS 6 each  6 each Topical Daily Loletha Grayer, MD   6 each at 06/03/22 1054   DULoxetine (CYMBALTA) DR capsule 40 mg  40 mg Oral BID Gilles Chiquito B, MD   40 mg at 06/03/22 1006   heparin injection 1,000 Units  1,000 Units Intracatheter PRN Colon Flattery, NP       levETIRAcetam (KEPPRA) tablet 500 mg  500 mg Oral 2 times per day on Sun Tue Thu Sat Gilles Chiquito B, MD   500 mg at 06/03/22 1007   [START ON 06/04/2022] levETIRAcetam (KEPPRA) tablet 500 mg  500 mg Oral 3 times per day on Mon Wed Fri Mullen, Emily B, MD       lidocaine (PF) (XYLOCAINE) 1 % injection 5 mL  5 mL Intradermal PRN Colon Flattery, NP       lidocaine-prilocaine (EMLA) cream 1 Application  1 Application Topical PRN Colon Flattery, NP       multivitamin (RENA-VIT) tablet 1 tablet  1 tablet Oral QHS Gilles Chiquito B, MD   1 tablet at 06/02/22 2222   ondansetron (ZOFRAN) tablet 4 mg  4 mg Oral Q6H PRN Sid Falcon, MD       Or   ondansetron [facility]) injection 4 mg  4 mg Intravenous Q6H PRN Sid Falcon, MD       pantoprazole (PROTONIX) EC tablet 40 mg  40 mg Oral Daily Gilles Chiquito B, MD   40 mg at 06/03/22 1007   pentafluoroprop-tetrafluoroeth (GEBAUERS) aerosol 1 Application  1 Application Topical PRN Colon Flattery, NP       senna-docusate (Senokot-S) tablet 1 tablet  1 tablet  Oral QHS Sid Falcon, MD   1 tablet at  06/02/22 2222   sevelamer carbonate (RENVELA) tablet 800 mg  800 mg Oral TID WC Sid Falcon, MD   800 mg at 06/03/22 1225    REVIEW OF SYSTEMS:   [X]  denotes positive finding, [ ]  denotes negative finding Cardiac  Comments:  Chest pain or chest pressure:    Shortness of breath upon exertion:    Short of breath when lying flat:    Irregular heart rhythm:        Vascular    Pain in calf, thigh, or hip brought on by ambulation:    Pain in feet at night that wakes you up from your sleep:     Blood clot in your veins:    Leg swelling:         Pulmonary    Oxygen at home:    Productive cough:     Wheezing:         Neurologic    Sudden weakness in arms or legs:     Sudden numbness in arms or legs:     Sudden onset of difficulty speaking or slurred speech:    Temporary loss of vision in one eye:     Problems with dizziness:         Gastrointestinal    Blood in stool:      Vomited blood:         Genitourinary    Burning when urinating:     Blood in urine:        Psychiatric    Major depression:         Hematologic    Bleeding problems:    Problems with blood clotting too easily:        Skin    Rashes or ulcers:        Constitutional    Fever or chills:     PHYSICAL EXAM:   Vitals:   06/02/22 2041 06/03/22 0108 06/03/22 0328 06/03/22 0909  BP: 107/71 103/64 102/62 104/67  Pulse: (!) 106 98 91 (!) 104  Resp: 16 17 20 20   Temp: 97.7 F (36.5 C) 98 F (36.7 C) 97.8 F (36.6 C) (!) 97.5 F (36.4 C)  TempSrc:  Oral Oral   SpO2: 100% 100% 100% 92%  Weight:      Height:        GENERAL: The patient is a well-nourished male, in no acute distress. The vital signs are documented above. CARDIAC: There is a regular rate and rhythm.  VASCULAR: Without pedal pulses on the left PULMONARY: Nonlabored respirations ABDOMEN: Soft and non-tender with normal pitched bowel sounds.  MUSCULOSKELETAL: Right leg amputation NEUROLOGIC: No focal weakness or paresthesias are  detected. SKIN: See photos below PSYCHIATRIC: The patient has a normal affect.     STUDIES:   None  ASSESSMENT and PLAN   Dry gangrene left foot: No obvious signs of infection.  The patient is not having any pain.  Based on the appearance of the dry gangrene, I suspect he is not a candidate for a transmetatarsal amputation, and the best option would be an above-knee amputation.  There is no urgent need for this, and it will be up to the patient if he wants to proceed.  Eliquis will be held tonight and Dr. Lucky Cowboy will follow-up in the morning.   Leia Alf, MD, FACS [facility] [phone] Pager [phone]

## 2022-06-04 DIAGNOSIS — L89152 Pressure ulcer of sacral region, stage 2: Secondary | ICD-10-CM | POA: Diagnosis not present

## 2022-06-04 DIAGNOSIS — F01518 Vascular dementia, unspecified severity, with other behavioral disturbance: Secondary | ICD-10-CM | POA: Diagnosis not present

## 2022-06-04 DIAGNOSIS — I96 Gangrene, not elsewhere classified: Secondary | ICD-10-CM | POA: Diagnosis not present

## 2022-06-04 DIAGNOSIS — N186 End stage renal disease: Secondary | ICD-10-CM | POA: Diagnosis not present

## 2022-06-04 LAB — CBC
HCT: 35.5 % — ABNORMAL LOW (ref 39.0–52.0)
Hemoglobin: 11.1 g/dL — ABNORMAL LOW (ref 13.0–17.0)
MCH: 24.8 pg — ABNORMAL LOW (ref 26.0–34.0)
MCHC: 31.3 g/dL (ref 30.0–36.0)
MCV: 79.2 fL — ABNORMAL LOW (ref 80.0–100.0)
Platelets: 306 10*3/uL (ref 150–400)
RBC: 4.48 MIL/uL (ref 4.22–5.81)
RDW: 19 % — ABNORMAL HIGH (ref 11.5–15.5)
WBC: 8.8 10*3/uL (ref 4.0–10.5)
nRBC: 0 % (ref 0.0–0.2)

## 2022-06-04 LAB — HEPATITIS B CORE ANTIBODY, TOTAL: Hep B Core Total Ab: NONREACTIVE

## 2022-06-04 LAB — RENAL FUNCTION PANEL
Albumin: 2.3 g/dL — ABNORMAL LOW (ref 3.5–5.0)
Anion gap: 9 (ref 5–15)
BUN: 39 mg/dL — ABNORMAL HIGH (ref 8–23)
CO2: 28 mmol/L (ref 22–32)
Calcium: 8.3 mg/dL — ABNORMAL LOW (ref 8.9–10.3)
Chloride: 98 mmol/L (ref 98–111)
Creatinine, Ser: 4.45 mg/dL — ABNORMAL HIGH (ref 0.61–1.24)
GFR, Estimated: 13 mL/min — ABNORMAL LOW (ref >60)
Glucose, Bld: 112 mg/dL — ABNORMAL HIGH (ref 70–99)
Phosphorus: 2.8 mg/dL (ref 2.5–4.6)
Potassium: 3.2 mmol/L — ABNORMAL LOW (ref 3.5–5.1)
Sodium: 135 mmol/L (ref 135–145)

## 2022-06-04 LAB — HEPATITIS B SURFACE ANTIGEN: Hepatitis B Surface Ag: NONREACTIVE

## 2022-06-04 LAB — HEPATITIS C ANTIBODY: HCV Ab: NONREACTIVE

## 2022-06-04 LAB — HEPATITIS B SURFACE ANTIBODY,QUALITATIVE: Hep B S Ab: REACTIVE — AB

## 2022-06-04 LAB — GLUCOSE, CAPILLARY: Glucose-Capillary: 148 mg/dL — ABNORMAL HIGH (ref 70–99)

## 2022-06-04 MED ORDER — HALOPERIDOL LACTATE 5 MG/ML IJ SOLN
1.0000 mg | Freq: Four times a day (QID) | INTRAMUSCULAR | Status: DC | PRN
  Administered 2022-06-04 – 2022-06-21 (×7): 1 mg via INTRAVENOUS
  Filled 2022-06-04 (×8): qty 1

## 2022-06-04 MED ORDER — VITAMIN C 500 MG PO TABS
500.0000 mg | ORAL_TABLET | Freq: Two times a day (BID) | ORAL | Status: DC
  Administered 2022-06-04 – 2022-06-17 (×20): 500 mg via ORAL
  Filled 2022-06-04 (×21): qty 1

## 2022-06-04 MED ORDER — SODIUM CHLORIDE 0.9 % IV SOLN
3.0000 g | INTRAVENOUS | Status: DC
  Administered 2022-06-04 – 2022-06-08 (×5): 3 g via INTRAVENOUS
  Filled 2022-06-04: qty 8
  Filled 2022-06-04 (×3): qty 3
  Filled 2022-06-04: qty 8

## 2022-06-04 MED ORDER — PROSOURCE PLUS PO LIQD
30.0000 mL | Freq: Three times a day (TID) | ORAL | Status: DC
  Administered 2022-06-05 – 2022-06-17 (×14): 30 mL via ORAL
  Filled 2022-06-04 (×42): qty 30

## 2022-06-04 MED ORDER — LEVETIRACETAM IN NACL 500 MG/100ML IV SOLN
500.0000 mg | INTRAVENOUS | Status: DC
  Administered 2022-06-04 – 2022-06-18 (×13): 500 mg via INTRAVENOUS
  Filled 2022-06-04 (×19): qty 100

## 2022-06-04 MED ORDER — VANCOMYCIN HCL 500 MG/100ML IV SOLN
500.0000 mg | INTRAVENOUS | Status: DC
  Administered 2022-06-06: 500 mg via INTRAVENOUS
  Filled 2022-06-04 (×2): qty 100

## 2022-06-04 MED ORDER — NEPRO/CARBSTEADY PO LIQD
237.0000 mL | Freq: Three times a day (TID) | ORAL | Status: DC
  Administered 2022-06-05 – 2022-06-17 (×22): 237 mL via ORAL

## 2022-06-04 MED ORDER — LEVETIRACETAM IN NACL 500 MG/100ML IV SOLN
500.0000 mg | INTRAVENOUS | Status: DC
  Administered 2022-06-05 – 2022-06-19 (×18): 500 mg via INTRAVENOUS
  Filled 2022-06-04 (×18): qty 100

## 2022-06-04 MED ORDER — ZINC SULFATE 220 (50 ZN) MG PO CAPS
220.0000 mg | ORAL_CAPSULE | Freq: Every day | ORAL | Status: AC
  Administered 2022-06-05 – 2022-06-17 (×9): 220 mg via ORAL
  Filled 2022-06-04 (×10): qty 1

## 2022-06-04 MED ORDER — VANCOMYCIN HCL 1250 MG/250ML IV SOLN
1250.0000 mg | Freq: Once | INTRAVENOUS | Status: AC
  Administered 2022-06-04: 1250 mg via INTRAVENOUS
  Filled 2022-06-04: qty 250

## 2022-06-04 NOTE — Progress Notes (Addendum)
[facility]  ROUNDING NOTE   Subjective:   Ernest Ernest [name] is a 76 year old African-American male with past medical conditions including CAD, COPD, CHF, diabetes, stroke with late seizures, hypertension, and end-stage renal disease on hemodialysis.  Patient presents to the emergency department from [facility] with concerns of left foot wound and has been admitted for Dry gangrene [facility]) [I96]  Patient is known to our practice from previous admissions and receives outpatient dialysis treatments at [facility] on a MWF schedule, supervised by [facility] physicians.    Patient seen laying in bed Breakfast tray at bedside Complains of sacral pain, states he has wound in that area Room air and no edema   Objective:  Vital signs in last 24 hours:  Temp:  [97.7 F (36.5 C)-97.8 F (36.6 C)] 97.8 F (36.6 C) (08/21 0806) Pulse Rate:  [86-98] 86 (08/21 0806) Resp:  [16-18] 16 (08/21 0806) BP: (103-105)/(59-63) 105/63 (08/21 0806) SpO2:  [96 %-97 %] 97 % (08/21 0806)  Weight change:  Filed Weights   06/02/22 1516 06/02/22 2026  Weight: 66 kg 57.9 kg    Intake/Output: I/O last 3 completed shifts: In: 120 [P.O.:120] Out: -    Intake/Output this shift:  Total I/O In: 240 [P.O.:240] Out: -   Physical Exam: Ernest [name]: NAD  Head: Normocephalic, atraumatic. Moist oral mucosal membranes  Eyes: Anicteric  Neck: Supple  Lungs:  Clear to auscultation, normal effort, room air  Heart: Regular rate and rhythm  Abdomen:  Soft, nontender, nondistended  Extremities: No peripheral edema.  Neurologic: Nonfocal, moving all four extremities  Skin: necrotic lesions lateral left foot  Access: Right thigh PermCath    Basic Metabolic Panel: Recent Labs  Lab 06/02/22 1526 06/03/22 0534 06/04/22 0540  NA 134* 135 135  K 2.6* 3.1* 3.2*  CL 97* 98 98  CO2 28 28 28   GLUCOSE 170* 80 112*  BUN 26* 32* 39*  CREATININE 2.94* 3.46* 4.45*  CALCIUM 7.4* 7.9* 8.3*  PHOS  --    --  2.8     Liver Function Tests: Recent Labs  Lab 06/02/22 1526 06/04/22 0540  AST 16  --   ALT 9  --   ALKPHOS 85  --   BILITOT 0.9  --   PROT 6.2*  --   ALBUMIN 2.2* 2.3*    No results for input(s): "LIPASE", "AMYLASE" in the last 168 hours. No results for input(s): "AMMONIA" in the last 168 hours.  CBC: Recent Labs  Lab 06/02/22 1526 06/03/22 0534 06/04/22 0540  WBC 8.3 8.0 8.8  NEUTROABS 6.2  --   --   HGB 11.6* 11.4* 11.1*  HCT 37.3* 36.9* 35.5*  MCV 80.4 79.5* 79.2*  PLT 269 251 306     Cardiac Enzymes: No results for input(s): "CKTOTAL", "CKMB", "CKMBINDEX", "TROPONINI" in the last 168 hours.  BNP: Invalid input(s): "POCBNP"  CBG: Recent Labs  Lab 06/03/22 0840 06/03/22 1006 06/03/22 1133 06/03/22 1644 06/03/22 2052  GLUCAP 66* 122* 125* 153* 157*     Microbiology: Results for orders placed or performed during the hospital encounter of 06/02/22  Blood culture (routine x 2)     Status: None (Preliminary result)   Collection Time: 06/02/22  4:24 PM   Specimen: BLOOD  Result Value Ref Range Status   Specimen Description BLOOD LEFT ANTECUBITAL  Final   Special Requests   Final    BOTTLES DRAWN AEROBIC AND ANAEROBIC Blood Culture results may not be optimal due to an inadequate  volume of blood received in culture bottles   Culture   Final    NO GROWTH 2 DAYS Performed at [facility], [address] [place], [place] [address]    Report Status PENDING  Incomplete  Blood culture (routine x 2)     Status: None (Preliminary result)   Collection Time: 06/02/22  8:45 PM   Specimen: BLOOD LEFT HAND  Result Value Ref Range Status   Specimen Description BLOOD LEFT HAND  Final   Special Requests   Final    BOTTLES DRAWN AEROBIC AND ANAEROBIC Blood Culture adequate volume   Culture   Final    NO GROWTH 2 DAYS Performed at [facility], [address] [place], [place] [address]    Report Status PENDING  Incomplete     Coagulation Studies: No results for input(s): "LABPROT", "INR" in the last 72 hours.  Urinalysis: No results for input(s): "COLORURINE", "LABSPEC", "PHURINE", "GLUCOSEU", "HGBUR", "BILIRUBINUR", "KETONESUR", "PROTEINUR", "UROBILINOGEN", "NITRITE", "LEUKOCYTESUR" in the last 72 hours.  Invalid input(s): "APPERANCEUR"    Imaging: No results found.   Medications:    anticoagulant sodium citrate      amLODipine  5 mg Oral Daily   atorvastatin  40 mg Oral Daily   Chlorhexidine Gluconate Cloth  6 each Topical Daily   DULoxetine  40 mg Oral BID   levETIRAcetam  500 mg Oral 2 times per day on Sun Tue Thu Sat   levETIRAcetam  500 mg Oral 3 times per day on Mon Wed Fri   multivitamin  1 tablet Oral QHS   pantoprazole  40 mg Oral Daily   senna-docusate  1 tablet Oral QHS   sevelamer carbonate  800 mg Oral TID WC   acetaminophen, alteplase, anticoagulant sodium citrate, heparin, lidocaine (PF), lidocaine-prilocaine, ondansetron **OR** ondansetron (ZOFRAN) IV, pentafluoroprop-tetrafluoroeth  Assessment/ Plan:  Ernest Ernest [name] is a 76 y.o.  male  with past medical conditions including CAD, COPD, CHF, diabetes, stroke with late seizures, hypertension, and end-stage renal disease on hemodialysis.  Patient presents to the emergency department from [place] with concerns of left foot wound and has been admitted for 06/02/2022  3:25 PM   End-stage renal disease on hemodialysis.  Last treatment completed on Friday. Scheduled to receive dialysis today, UF goal 0.5-1L as tolerated. Next treatment scheduled for Wednesday.  2. Anemia of chronic kidney disease Lab Results  Component Value Date   HGB 11.1 (L) 06/04/2022    Hemoglobin at goal.  Patient receives [place] outpatient  3. Secondary Hyperparathyroidism: with outpatient labs: PTH 534, phosphorus 4.5, calcium 8.0 on 05/21/2022.   Lab Results  Component Value Date   PTH 63 10/05/2017   PTH Comment 10/05/2017   CALCIUM 8.3  (L) 06/04/2022   CAION 1.05 (L) 07/12/2020   PHOS 2.8 06/04/2022    Calcium improving.  Continue sevelamer with meals.  4.  Hypertension with chronic kidney disease.  Home regimen includes amlodipine.  Currently prescribed this.  5.  Dry gangrene, suspected on left foot.  Vascular surgery consulted and suspect a AKA may be needed. Holding Eliquis.     LOS: 2   8/21/202312:55 PM

## 2022-06-04 NOTE — Progress Notes (Signed)
Pharmacy Antibiotic Note  Ernest [name] is a 76 y.o. male admitted on 06/02/2022 with  wound/ gangrene foot infection .  Pharmacy has been consulted for Vancmycin and Unasyn dosing.  -Hemodialysis pt -  Plan: -Will order Vancomycin 1250mg  IV Loading dose and continue with Vancomycin 500mg  IV QHD-MWF after dialysis for weight=68 kg  -will order Unasyn 3 gm IV q24h after dialysis  F/u cx, plan AKA    Height: 5\' 9"  (175.3 cm) Weight: 57.9 kg (127 lb 10.3 oz) IBW/kg (Calculated) : 70.7  Temp (24hrs), Avg:97.8 F (36.6 C), Min:97.7 F (36.5 C), Max:97.8 F (36.6 C)  Recent Labs  Lab 06/02/22 1526 06/02/22 1625 06/03/22 0534 06/04/22 0540  WBC 8.3  --  8.0 8.8  CREATININE 2.94*  --  3.46* 4.45*  LATICACIDVEN 1.0 1.0  --   --     Estimated Creatinine Clearance: 12.1 mL/min (A) (by C-G formula based on SCr of 4.45 mg/dL (H)).    [name]  Antimicrobials this admission: Vanc 8/21 >> Unasyn 8/21>>  Dose adjustments this admission:    Microbiology results: 8/19 BCx: NG 2d   UCx:      Sputum:      MRSA PCR:    Thank you for allowing pharmacy to be a part of this patient's care.  Adren Dollins A 06/04/2022 2:59 PM

## 2022-06-04 NOTE — Consult Note (Signed)
Attempted consult, patient sleeping comfortably. Patient's bedside RN states that patient was lucid, refusing dialysis and medications.   Writer spoke with patient's daughter, [name], [phone]. She states she does not have a [place], he makes his own health care decisions. She reports that he will refuse dialysis at times and refuse to eat, but eventually comes around. He told he that "He does not want his foot removed." Writer discussed that the surgery is necessary due to the gangrene. Daughter states she will come later to talk to him.   Sherlon Handing, PMHNP

## 2022-06-04 NOTE — Progress Notes (Signed)
Patient refused to go to dialysis, made attending MD and dialysis MD and NP aware. Educated patient on the importance of going to dialysis and patient replied that he does not care and he is not going. I asked him will you please go for me and said, "No, Im not even going for myself." Patient also refused his Keppra and Vancomycin. Educated patient on not taking his medication and he said again no he does not want it. MD made aware.

## 2022-06-04 NOTE — Progress Notes (Addendum)
Progress Note   Patient: Ernest [name] XMI:[id number] DOB: May 05, 1946 DOA: 06/02/2022     2 DOS: the patient was seen and examined on 06/04/2022     Assessment and Plan: * Dry gangrene ([place]) Gangrene left foot.  Case discussed with vascular surgery and they will call family to help set up for AKA.  Holding Eliquis.  We will start empiric antibiotics until surgery.  End-stage renal disease on hemodialysis ([place]) Hemodialysis today.  Vascular dementia [facility]) This afternoon patient more aggressive with staff and myself.  We will get psychiatric consultation.  Pressure injury of skin Wound care consultation.  Stage II gluteal cleft.  Secondary hyperparathyroidism of renal origin ([place]) PTH 534, phosphorus 4.5 and calcium 8.0 on outpatient labs.  Hypokalemia Replaced on admission I will not replace any further being that the patient is a dialysis patient.  Impaired fasting glucose Hemoglobin A1c low at 4.7.  Patient is not a diabetic.  Discontinue fingersticks.  Anemia of chronic disease Hemoglobin 11.1  Seizure, late effect of stroke ([place]) On Keppra.  Chronic HFrEF (heart failure with reduced ejection fraction) (HCC) No signs of heart failure.  Dialysis to remove fluid       Subjective: Patient felt okay this morning.  Seen while eating breakfast.  Called back by nursing staff to see patient because he was not eating and not taking meds.  Patient told me to back out of the room.  I was able to talk with him a little bit and distract him so I can get some answers.  Patient told me he was going to kill me if I stayed in the room.  Physical Exam: Vitals:   06/03/22 0909 06/03/22 2059 06/04/22 0336 06/04/22 0806  BP: 104/67 104/62 (!) 103/59 105/63  Pulse: (!) 104 91 98 86  Resp: 20 18 17 16   Temp: (!) 97.5 F (36.4 C) 97.8 F (36.6 C) 97.7 F (36.5 C) 97.8 F (36.6 C)  TempSrc:      SpO2: 92% 96% 96% 97%  Weight:      Height:       Physical Exam HENT:     Head:  Normocephalic.     Mouth/Throat:     Pharynx: No oropharyngeal exudate.  Eyes:     General: Lids are normal.     Conjunctiva/sclera: Conjunctivae normal.  Cardiovascular:     Rate and Rhythm: Normal rate and regular rhythm.     Heart sounds: Normal heart sounds, S1 normal and S2 normal.  Pulmonary:     Breath sounds: No decreased breath sounds, wheezing, rhonchi or rales.  Abdominal:     Palpations: Abdomen is soft.     Tenderness: There is no abdominal tenderness.  Musculoskeletal:     Left lower leg: Swelling present.     Right Lower Extremity: Right leg is amputated above knee.  Skin:    General: Skin is warm.     Comments: Gangrene left foot  Neurological:     Mental Status: He is alert.     Comments: This morning answering questions appropriately.  This afternoon more aggressive with his verbalization towards staff and me.     Data Reviewed: Hemoglobin 11.1, potassium 3.2, creatinine 4.45, albumin 2.3  Family Communication: Spoke with patient's daughter  Disposition: Status is: Inpatient Remains inpatient appropriate because: Vascular surgery team to contact the patient's daughter about setting up an AKA.  Planned Discharge Destination: Skilled nursing facility    Time spent: 30 minutes  Author: Loletha Grayer, MD  06/04/2022 2:32 PM  For on call review [email or web address].

## 2022-06-04 NOTE — Consult Note (Signed)
WOC Nurse Consult Note: Reason for Consult: gluteal cleft and left foot Patient with dry gangrene of the left midfoot and toes.  VVS has seen patient and will have Dr. Lucky Cowboy follow up for needed surgical intervention, BKA recommended.  Gluteal cleft wound is not documented, I am verifying the presence and description of the wound  Wound type:  Arterial disease LLE with dry gangrene Pressure injury vs moisture associated skin damage; gluteal cleft  Pressure Injury POA: Yes/No/NA Wound bed: Drainage (amount, consistency, odor)  Periwound: Dressing procedure/placement/frequency: Paint left foot/toes with betadine; allow to air dry. Pending further surgical intervention Silicone foam to the gluteal cleft wound, change every 3 days and PRN soilage.   Discussed POC with patient and bedside nurse.  Re consult if needed, will not follow at this time. Thanks  Jaelen Soth [employer], RN,CWOCN, CNS, [place] [phone])

## 2022-06-04 NOTE — Progress Notes (Signed)
Went to room to round on patient and patient had took gown off. Tried to help patient slide his gown back on and he tried to hit me, was able to get gown slid back up but patient took it off again. Patient would not talk to me. Patient was A/O x3 this morning so made the MD aware. Later went to room to check on patient and he seemed angry and asked where I had been,  told patient I had been in there previously and asked if he needed anything. The patient then look at me and raised his voice and said, "Get out the door before I blow a hole in you." Left the room and made MD aware of same.

## 2022-06-04 NOTE — Progress Notes (Signed)
Initial Nutrition Assessment  DOCUMENTATION CODES:   Severe malnutrition in context of chronic illness  INTERVENTION:   -Continue renal MVI daily -Nepro Shake po TID, each supplement provides 425 kcal and 19 grams protein  -30 ml Prosource Plus TID, each supplement provides 100 kcals and 15 grams protein -500 mg vitamin C BID -220 mg zinc sulfate x 14 days  NUTRITION DIAGNOSIS:   Severe Malnutrition related to chronic illness (COPD) as evidenced by severe fat depletion, severe muscle depletion.  GOAL:   Patient will meet greater than or equal to 90% of their needs  MONITOR:   PO intake, Supplement acceptance  REASON FOR ASSESSMENT:   Malnutrition Screening Tool    ASSESSMENT:   Pt with medical history significant of CHF, COPD, CAD, Aflutter on eliquis, ESRD on HD MWF, DM2, h/o stroke with late seizure on keppra, HLD, HTN who presents for dry gangrene of the left foot.  Pt admitted with dry gangrene of lt foot.   Reviewed I/O's: +120 ml x 24 hours  Pt lying in bed at time of visit. He did not respond to RD questions. No supports at bedside.   Pt currently on a renal diet with 1.2 L fluid restriction. Pt is consuming 100% of meals.   Reviewed wt hx; pt has experienced a 20% wt loss over the past 6 months, which is significant for time frame.   Palliative care has been consulted for goals of care discussions.   Medications reviewed and include keppra, senokot, and renvela.   Labs reviewed: CBGS: 122-157 (inpatient orders for glycemic control are none).    NUTRITION - FOCUSED PHYSICAL EXAM:  Flowsheet Row Most Recent Value  Orbital Region Severe depletion  Upper Arm Region Severe depletion  Thoracic and Lumbar Region Severe depletion  Buccal Region Severe depletion  Temple Region Severe depletion  Clavicle Bone Region Severe depletion  Clavicle and Acromion Bone Region Severe depletion  Scapular Bone Region Severe depletion  Dorsal Hand Severe depletion   Patellar Region Severe depletion  Anterior Thigh Region Severe depletion  Posterior Calf Region Severe depletion  Edema (RD Assessment) Mild  Hair Reviewed  Eyes Reviewed  Mouth Reviewed  Skin Reviewed  Nails Reviewed        Diet Order:   Diet Order             Diet renal with fluid restriction Fluid restriction: 1200 mL Fluid; Room service appropriate? Yes; Fluid consistency: Thin  Diet effective now                   EDUCATION NEEDS:   Not appropriate for education at this time  Skin:  Skin Assessment: Reviewed RN Assessment  Last BM:  06/03/22 (type 6)  Height:   Ht Readings from Last 1 Encounters:  06/02/22 5\' 9"  (1.753 m)    Weight:   Wt Readings from Last 1 Encounters:  06/02/22 57.9 kg    Ideal Body Weight:  66.9 kg (adjusted for rt AKA)  BMI:  Body mass index is 18.85 kg/m.  Estimated Nutritional Needs:   Kcal:     Protein:     Fluid:       Loistine Chance, RD, LDN, [place] Registered Dietitian II Certified Diabetes Care and Education Specialist Please refer to [facility] for RD and/or RD on-call/weekend/after hours pager

## 2022-06-05 DIAGNOSIS — F01518 Vascular dementia, unspecified severity, with other behavioral disturbance: Secondary | ICD-10-CM | POA: Diagnosis not present

## 2022-06-05 DIAGNOSIS — E43 Unspecified severe protein-calorie malnutrition: Secondary | ICD-10-CM

## 2022-06-05 DIAGNOSIS — I96 Gangrene, not elsewhere classified: Secondary | ICD-10-CM | POA: Diagnosis not present

## 2022-06-05 DIAGNOSIS — F4329 Adjustment disorder with other symptoms: Secondary | ICD-10-CM

## 2022-06-05 DIAGNOSIS — Z89611 Acquired absence of right leg above knee: Secondary | ICD-10-CM | POA: Diagnosis not present

## 2022-06-05 DIAGNOSIS — N186 End stage renal disease: Secondary | ICD-10-CM | POA: Diagnosis not present

## 2022-06-05 DIAGNOSIS — I110 Hypertensive heart disease with heart failure: Secondary | ICD-10-CM

## 2022-06-05 DIAGNOSIS — Z66 Do not resuscitate: Secondary | ICD-10-CM

## 2022-06-05 DIAGNOSIS — L8990 Pressure ulcer of unspecified site, unspecified stage: Secondary | ICD-10-CM

## 2022-06-05 DIAGNOSIS — Z8673 Personal history of transient ischemic attack (TIA), and cerebral infarction without residual deficits: Secondary | ICD-10-CM

## 2022-06-05 DIAGNOSIS — L89152 Pressure ulcer of sacral region, stage 2: Secondary | ICD-10-CM | POA: Diagnosis not present

## 2022-06-05 DIAGNOSIS — Z515 Encounter for palliative care: Secondary | ICD-10-CM

## 2022-06-05 DIAGNOSIS — I70262 Atherosclerosis of native arteries of extremities with gangrene, left leg: Secondary | ICD-10-CM | POA: Diagnosis not present

## 2022-06-05 DIAGNOSIS — I10 Essential (primary) hypertension: Secondary | ICD-10-CM

## 2022-06-05 LAB — HEPATITIS B SURFACE ANTIBODY, QUANTITATIVE: Hep B S AB Quant (Post): 445.5 m[IU]/mL (ref >9.9)

## 2022-06-05 MED ORDER — APIXABAN 2.5 MG PO TABS
2.5000 mg | ORAL_TABLET | Freq: Two times a day (BID) | ORAL | Status: DC
  Administered 2022-06-05 – 2022-06-13 (×15): 2.5 mg via ORAL
  Filled 2022-06-05 (×16): qty 1

## 2022-06-05 NOTE — H&P (View-Only) (Signed)
[facility] Vascular Consult Note  MRN : [id number]  Ernest [name] is a 76 y.o. (Mar 06, 1946) male who presents with chief complaint of  Chief Complaint  Patient presents with   Leg Pain   Wound Infection  .  Spoke with the patient today regarding amputation.  The patient currently is calm and reserved.  He currently declines amputation of left lower extremity.  Current Facility-Administered Medications  Medication Dose Route Frequency Provider Last Rate Last Admin   (feeding supplement) PROSource Plus liquid 30 mL  30 mL Oral TID BM Wieting, Richard, MD   30 mL at 06/05/22 1351   acetaminophen (TYLENOL) tablet 1,000 mg  1,000 mg Oral Q6H PRN Gilles Chiquito B, MD       alteplase (CATHFLO ACTIVASE) injection 2 mg  2 mg Intracatheter Once PRN Colon Flattery, NP       Ampicillin-Sulbactam (UNASYN) 3 g in sodium chloride 0.9 % 100 mL IVPB  3 g Intravenous Q24H Chinita Greenland A, RPH 200 mL/hr at 06/04/22 1952 3 g at 06/04/22 1952   anticoagulant sodium citrate solution 5 mL  5 mL Intracatheter PRN Colon Flattery, NP       apixaban Arne [place]) tablet 2.5 mg  2.5 mg Oral BID Loletha Grayer, MD   2.5 mg at 06/05/22 1351   ascorbic acid (VITAMIN C) tablet 500 mg  500 mg Oral BID Loletha Grayer, MD   500 mg at 06/05/22 7342   atorvastatin (LIPITOR) tablet 40 mg  40 mg Oral Daily Gilles Chiquito B, MD   40 mg at 06/05/22 1202   Chlorhexidine Gluconate Cloth 2 % PADS 6 each  6 each Topical Daily Loletha Grayer, MD   6 each at 06/05/22 1206   DULoxetine (CYMBALTA) DR capsule 40 mg  40 mg Oral BID Gilles Chiquito B, MD   40 mg at 06/05/22 8768   feeding supplement (NEPRO CARB STEADY) liquid 237 mL  237 mL Oral TID BM Loletha Grayer, MD   237 mL at 06/05/22 1351   haloperidol lactate (HALDOL) injection 1 mg  1 mg Intravenous Q6H PRN Loletha Grayer, MD   1 mg at 06/04/22 1658   heparin injection 1,000 Units  1,000 Units Intracatheter PRN Colon Flattery, NP        levETIRAcetam (KEPPRA) IVPB 500 mg/100 mL premix  500 mg Intravenous 2 times per day on Sun Tue Thu Sat Benita Gutter, [facility]   Stopped at 06/05/22 1010   levETIRAcetam (KEPPRA) IVPB 500 mg/100 mL premix  500 mg Intravenous 3 times per day on Mon Wed Fri Benita Gutter, RPH 400 mL/hr at 06/04/22 2214 500 mg at 06/04/22 2214   lidocaine (PF) (XYLOCAINE) 1 % injection 5 mL  5 mL Intradermal PRN Colon Flattery, NP       lidocaine-prilocaine (EMLA) cream 1 Application  1 Application Topical PRN Colon Flattery, NP       multivitamin (RENA-VIT) tablet 1 tablet  1 tablet Oral QHS Gilles Chiquito B, MD   1 tablet at 06/04/22 2210   ondansetron (ZOFRAN) tablet 4 mg  4 mg Oral Q6H PRN Sid Falcon, MD       Or   ondansetron [facility]) injection 4 mg  4 mg Intravenous Q6H PRN Sid Falcon, MD       pantoprazole (PROTONIX) EC tablet 40 mg  40 mg Oral Daily Gilles Chiquito B, MD   40 mg at 06/05/22 1157   pentafluoroprop-tetrafluoroeth (GEBAUERS) aerosol 1 Application  1  Application Topical PRN Colon Flattery, NP       senna-docusate (Senokot-S) tablet 1 tablet  1 tablet Oral QHS Sid Falcon, MD   1 tablet at 06/03/22 2155   sevelamer carbonate (RENVELA) tablet 800 mg  800 mg Oral TID WC Gilles Chiquito B, MD   800 mg at 06/05/22 1206   [START ON 06/06/2022] vancomycin (VANCOREADY) IVPB 500 mg/100 mL  500 mg Intravenous Q M,W,F-HD Chinita Greenland A, RPH       zinc sulfate capsule 220 mg  220 mg Oral Daily Loletha Grayer, MD   220 mg at 06/05/22 2992    Past Medical History:  Diagnosis Date   Abnormal stress test    s/p cath November 2013 with modest disease involving the ostial left main, proximal LAD, proximal RCA - do not appear to be hemodynamically signficant; mild LV dysfunction   Anemia    low iron   Anxiety    Arthritis    Bacteremia    Chronic systolic CHF (congestive heart failure) (HCC)    COPD (chronic obstructive pulmonary disease) ([place])    Coronary artery disease    a. per  cath report 2013, b. 09/13/2017-CABG X4 & Mitral valve repair   Depression    DVT (deep venous thrombosis) (HCC)    arm - right- finished blood thinner- not sure when it   Ejection fraction < 50%    Erectile dysfunction    penile implant   ESRD (end stage renal disease) ([place])    [facility] on a MWF schedule.      Gout    Hx: of   Hepatitis C    History of seizures    last one 2018   Hyperlipidemia    Hypertension    Mitral valve disease    s/p Mitral repair 09/13/17   Obesity    Retinopathy    Shortness of breath    Hx: of with exertion   Stroke [facility])    "several" left side weakness, speech affected   Type II or unspecified type diabetes mellitus without mention of complication, not stated as uncontrolled    adult onset    Past Surgical History:  Procedure Laterality Date   A/V FISTULAGRAM N/A 10/04/2017   Procedure: A/V FISTULAGRAM;  Surgeon: Elam Dutch, MD;  Location: [place] [facility];  Service: Cardiovascular;  Laterality: N/A;   A/V SHUNTOGRAM Left 07/12/2020   Procedure: A/V SHUNTOGRAM;  Surgeon: Serafina Mitchell, MD;  Location: [place] [facility];  Service: Cardiovascular;  Laterality: Left;   AMPUTATION Right 07/08/2021   Procedure: AMPUTATION ABOVE KNEE;  Surgeon: Elmore Guise, MD;  Location: [facility];  Service: Vascular;  Laterality: Right;   ANGIOPLASTY Left 09/24/2019   Procedure: Angioplasty Innominate Vien;  Surgeon: Serafina Mitchell, MD;  Location: [place];  Service: Vascular;  Laterality: Left;   AV FISTULA PLACEMENT Left 01/13/2013   Procedure: INSERTION OF ARTERIOVENOUS (AV) GORE-TEX GRAFT ARM;  Surgeon: Angelia Mould, MD;  Location: [place];  Service: Vascular;  Laterality: Left;   AV FISTULA PLACEMENT Left 05/05/2013   Procedure: INSERTION OF LEFT UPPER ARM  ARTERIOVENOUS GORTEX GRAFT;  Surgeon: Angelia Mould, MD;  Location: [place];  Service: Vascular;  Laterality: Left;   AV FISTULA PLACEMENT Left 11/26/2019   Procedure: INSERTION OF  ARTERIOVENOUS (AV) GORE-TEX GRAFT, left  ARM;  Surgeon: Serafina Mitchell, MD;  Location: [place];  Service: Vascular;  Laterality: Left;   [place] REMOVAL Left 03/26/2013  Procedure: REMOVAL OF  NON INCORPORATED ARTERIOVENOUS GORETEX GRAFT ([place]) left arm * repair of  left brachial artery with vein patch angioplasty.;  Surgeon: Angelia Mould, MD;  Location: [place];  Service: Vascular;  Laterality: Left;   CARDIAC CATHETERIZATION  August 26, 2012   CATARACT EXTRACTION W/ INTRAOCULAR LENS  IMPLANT, BILATERAL     COLONOSCOPY     CORONARY ARTERY BYPASS GRAFT N/A 09/13/2017   Procedure: CORONARY ARTERY BYPASS GRAFTING (CABG) times four LIMA  to LAD, SVG sequentially to OM and RAMUS Intermediate and SVG to Acute Marginal;  Surgeon: Melrose Nakayama, MD;  Location: [place];  Service: Open Heart Surgery;  Laterality: N/A;   DIALYSIS/PERMA CATHETER INSERTION N/A 02/28/2021   Procedure: DIALYSIS/PERMA CATHETER EXCHANGE;  Surgeon: Katha Cabal, MD;  Location: [place] CV LAB;  Service: Cardiovascular;  Laterality: N/A;   EMBOLECTOMY Right 08/27/2013   Procedure: EMBOLECTOMY;  Surgeon: Mal Misty, MD;  Location: [place];  Service: Vascular;  Laterality: Right;  Thrombectomy of Radial and ulnar artery.   ENDOVASCULAR STENT INSERTION Left 09/24/2019   Procedure: Endovascular Stent Graft Insertion, Arm Graft;  Surgeon: Serafina Mitchell, MD;  Location: [facility] OR;  Service: Vascular;  Laterality: Left;   EYE SURGERY     laser.  and surgery for DM   fistula     RUE and wrist   FISTULOGRAM Left 09/24/2019   Procedure: SHUNTOGRAM OF ARTERIOVENOUS FISTULA LEFT ARM,;  Surgeon: Serafina Mitchell, MD;  Location: [place];  Service: Vascular;  Laterality: Left;   INSERTION OF DIALYSIS CATHETER Right 01/01/2013   Procedure: INSERTION OF DIALYSIS CATHETER right  internal jugular;  Surgeon: Serafina Mitchell, MD;  Location: [place];  Service: Vascular;  Laterality: Right;   INSERTION OF DIALYSIS CATHETER N/A  03/26/2013   Procedure: INSERTION OF DIALYSIS CATHETER Left internal jugular vein;  Surgeon: Angelia Mould, MD;  Location: [place];  Service: Vascular;  Laterality: N/A;   INSERTION OF DIALYSIS CATHETER Right 11/26/2019   Procedure: INSERTION OF DIALYSIS CATHETER, right internal jugular;  Surgeon: Serafina Mitchell, MD;  Location: [place];  Service: Vascular;  Laterality: Right;   IR THORACENTESIS ASP PLEURAL SPACE W/IMG GUIDE  09/30/2019   LEFT HEART CATH AND CORONARY ANGIOGRAPHY N/A 09/09/2017   Procedure: LEFT HEART CATH AND CORONARY ANGIOGRAPHY;  Surgeon: Troy Sine, MD;  Location: [place] CV LAB;  Service: Cardiovascular;  Laterality: N/A;   LIGATION OF ARTERIOVENOUS  FISTULA Right 12/30/2013   Procedure: REMOVAL OF SEGMENT OF GORTEX GRAFT AND FISTULA  AND REPAIR OF BRACHIAL ARTERY;  Surgeon: Mal Misty, MD;  Location: [place];  Service: Vascular;  Laterality: Right;   LOWER EXTREMITY ANGIOGRAPHY N/A 06/28/2021   Procedure: Lower Extremity Angiography;  Surgeon: Katha Cabal, MD;  Location: [place] [facility];  Service: Cardiovascular;  Laterality: N/A;   LOWER EXTREMITY ANGIOGRAPHY Left 11/23/2021   Procedure: LOWER EXTREMITY ANGIOGRAPHY;  Surgeon: Algernon Huxley, MD;  Location: [place] CV LAB;  Service: Cardiovascular;  Laterality: Left;   MITRAL VALVE REPAIR N/A 09/13/2017   Procedure: MITRAL VALVE REPAIR (MVR);  Surgeon: Melrose Nakayama, MD;  Location: [place];  Service: Open Heart Surgery;  Laterality: N/A;   MITRAL VALVE REPAIR (MV)/CORONARY ARTERY BYPASS GRAFTING (CABG)  09/13/2017   PENILE PROSTHESIS IMPLANT     1997.. no card   PERIPHERAL VASCULAR BALLOON ANGIOPLASTY Left 10/04/2017   Procedure: PERIPHERAL VASCULAR BALLOON ANGIOPLASTY;  Surgeon: Elam Dutch, MD;  Location: [place] [facility] LAB;  Service: Cardiovascular;  Laterality: Left;  AVF   PERIPHERAL VASCULAR BALLOON ANGIOPLASTY Left 07/12/2020   Procedure: PERIPHERAL VASCULAR BALLOON ANGIOPLASTY;   Surgeon: Serafina Mitchell, MD;  Location: [place] [facility];  Service: Cardiovascular;  Laterality: Left;  ARM SHUNTOGRAM   REVISION OF ARTERIOVENOUS GORETEX GRAFT Left 09/24/2019   Procedure: REVISION OF ARTERIOVENOUS GORETEX GRAFT LEFT ARM;  Surgeon: Serafina Mitchell, MD;  Location: [facility] OR;  Service: Vascular;  Laterality: Left;   TEE WITHOUT CARDIOVERSION N/A 06/11/2013   Procedure: TRANSESOPHAGEAL ECHOCARDIOGRAM (TEE);  Surgeon: Larey Dresser, MD;  Location: [facility] ENDOSCOPY;  Service: Cardiovascular;  Laterality: N/A;   TEE WITHOUT CARDIOVERSION N/A 09/13/2017   Procedure: TRANSESOPHAGEAL ECHOCARDIOGRAM (TEE);  Surgeon: Melrose Nakayama, MD;  Location: [place];  Service: Open Heart Surgery;  Laterality: N/A;   THROMBECTOMY W/ EMBOLECTOMY Left 03/26/2013   Procedure: Attempted thrombectomy of left arm arteriovenous goretex graft.;  Surgeon: Angelia Mould, MD;  Location: [facility] OR;  Service: Vascular;  Laterality: Left;   ULTRASOUND GUIDANCE FOR VASCULAR ACCESS Bilateral 09/24/2019   Procedure: Ultrasound Guidance For Vascular Access, Right Femoral Vein and Left Arm Graft;  Surgeon: Serafina Mitchell, MD;  Location: [facility] OR;  Service: Vascular;  Laterality: Bilateral;   VENOGRAM N/A 04/07/2013   Procedure: VENOGRAM;  Surgeon: Serafina Mitchell, MD;  Location[facility] C[facility];  Service: Cardiovascular;  Laterality: N/A;    Social History Social History   Tobacco Use   Smoking status: Former    Years: 7.00    Types: Cigarettes    Quit date: 06/10/1973    Years since quitting: 49.0   Smokeless tobacco: Never   Tobacco comments:    Quit in 1974.  Vaping Use   Vaping Use: Never used  Substance Use Topics   Alcohol use: No    Alcohol/week: 0.0 standard drinks of alcohol   Drug use: No    Family History Family History  Problem Relation Age of Onset   Heart disease Father    Diabetes Sister    Diabetes Brother    Diabetes Son     [name]   REVIEW OF SYSTEMS (Negative  unless checked)  Constitutional: [] Weight loss  [] Fever  [] Chills Cardiac: [] Chest pain   [] Chest pressure   [] Palpitations   [] Shortness of breath when laying flat   [] Shortness of breath at rest   [] Shortness of breath with exertion. Vascular:  [] Pain in legs with walking   [] Pain in legs at rest   [] Pain in legs when laying flat   [] Claudication   [] Pain in feet when walking  [] Pain in feet at rest  [] Pain in feet when laying flat   [] History of DVT   [] Phlebitis   [] Swelling in legs   [] Varicose veins   [] Non-healing ulcers Pulmonary:   [] Uses home oxygen   [] Productive cough   [] Hemoptysis   [] Wheeze  [] COPD   [] Asthma Neurologic:  [] Dizziness  [] Blackouts   [] Seizures   [] History of stroke   [] History of TIA  [] Aphasia   [] Temporary blindness   [] Dysphagia   [] Weakness or numbness in arms   [x] Weakness or numbness in legs Musculoskeletal:  [] Arthritis   [] Joint swelling   [] Joint pain   [] Low back pain Hematologic:  [] Easy bruising  [] Easy bleeding   [] Hypercoagulable state   [] Anemic  [] Hepatitis Gastrointestinal:  [] Blood in stool   [] Vomiting blood  [] Gastroesophageal reflux/heartburn   [] Difficulty swallowing. Genitourinary:  [] Chronic kidney  disease   [] Difficult urination  [] Frequent urination  [] Burning with urination   [] Blood in urine Skin:  [] Rashes   [] Ulcers   [x] Wounds Psychological:  [] History of anxiety   []  History of major depression.  Physical Examination  Vitals:   06/04/22 2130 06/04/22 2130 06/05/22 0551 06/05/22 0829  BP: 126/78 126/78 113/61 113/64  Pulse: (!) 109 (!) 107 (!) 105 (!) 103  Resp: 17 17 17 16   Temp: 97.7 F (36.5 C) 97.7 F (36.5 C) 97.6 F (36.4 C) 98 F (36.7 C)  TempSrc:      SpO2: 99% 98% 95% 98%  Weight:      Height:       Body mass index is 18.85 kg/m. Gen:  WD/WN, NAD Head: [place]/AT, No temporalis wasting. Prominent temp pulse not noted. Ear/Nose/Throat: Hearing grossly intact, nares w/o erythema or drainage, oropharynx w/o  Erythema/Exudate Eyes: Sclera non-icteric, conjunctiva clear Neck: Trachea midline.  No JVD.  Pulmonary:  Good air movement, respirations not labored, equal bilaterally.  Cardiac: RRR, normal S1, S2. Vascular: Dry gangrene of left foot including forefoot area  Gastrointestinal: soft, non-tender/non-distended. No guarding/reflex.  Musculoskeletal: M/S 5/5 throughout.  Extremities without ischemic changes.  No deformity or atrophy. No edema. Neurologic: Sensation grossly intact in extremities.  Symmetrical.  Speech is fluent. Motor exam as listed above. Psychiatric: Judgment intact, Mood & affect appropriate for pt's clinical situation.     CBC Lab Results  Component Value Date   WBC 8.8 06/04/2022   HGB 11.1 (L) 06/04/2022   HCT 35.5 (L) 06/04/2022   MCV 79.2 (L) 06/04/2022   PLT 306 06/04/2022    BMET    Component Value Date/Time   NA 135 06/04/2022 0540   NA 140 12/11/2017 0000   K 3.2 (L) 06/04/2022 0540   CL 98 06/04/2022 0540   CO2 28 06/04/2022 0540   GLUCOSE 112 (H) 06/04/2022 0540   BUN 39 (H) 06/04/2022 0540   BUN 16 12/11/2017 0000   CREATININE 4.45 (H) 06/04/2022 0540   CALCIUM 8.3 (L) 06/04/2022 0540   CALCIUM 8.4 (L) 10/05/2017 0615   GFRNONAA 13 (L) 06/04/2022 0540   GFRAA 21 (L) 12/04/2019 2027   Estimated Creatinine Clearance: 12.1 mL/min (A) (by C-G formula based on SCr of 4.45 mg/dL (H)).  COAG Lab Results  Component Value Date   INR 4.5 (A) 12/27/2021   INR 1.1 09/25/2021   INR 1.3 (H) 07/08/2021   PROTIME 25.4 (A) 12/27/2017   PROTIME 33.5 (A) 12/24/2017   PROTIME 21.5 (A) 12/11/2017    Radiology No results found.    Assessment/Plan 1.  Gangrenous left foot  The patient recently seen and evaluated by psychiatry and he is deemed competent and able to make his own medical decisions.  This is second in by the patient's daughter.  She notes that while she does hold power of attorney she does not hold a healthcare power of attorney and  currently he makes his own medical decisions.  In discussion with the patient he does not wish to move forward with amputation of his left lower extremity at this time.  I discussed with the patient that he has dry gangrene which is stable currently.  However, dry gangrene often progresses to a wet gangrene and at which time it could begin to develop serious infection which may lead to sepsis and severe systemic infection.  At which time in order to heal the infection the patient would need to have the amputation done.  He again does not wish to move forward with amputation.  We will continue to follow and can follow-up on an outpatient basis as well. 2.  End-stage renal disease Patient has been refusing dialysis.  If he continues to refuse this may affect his decision making capacity.  Nephrology will continue to follow.   Family Communication:  Thank you for allowing [place] to participate in the care of this patient.   Kris Hartmann, NP [facility] [phone] (Office Phone) [phone] (Office Fax) [phone] (Pager)  06/05/2022 2:18 PM  Staff may message me via secure chat in [place]  but this may not receive immediate response,  please page for urgent matters!  Dictation software was used to generate the above note. Typos may occur and escape review, as with typed/written notes. Any error is purely unintentional.  Please contact me directly for clarity if needed.

## 2022-06-05 NOTE — Progress Notes (Signed)
[facility] Vascular Consult Note  MRN : [id number]  Ernest [name] is a 76 y.o. ([birth year]) male who presents with chief complaint of  Chief Complaint  Patient presents with   Leg Pain   Wound Infection  .  Spoke with the patient today regarding amputation.  The patient currently is calm and reserved.  He currently declines amputation of left lower extremity.  Current Facility-Administered Medications  Medication Dose Route Frequency Provider Last Rate Last Admin   (feeding supplement) PROSource Plus liquid 30 mL  30 mL Oral TID BM Wieting, Richard, MD   30 mL at 06/05/22 1351   acetaminophen (TYLENOL) tablet 1,000 mg  1,000 mg Oral Q6H PRN Gilles Chiquito B, MD       alteplase (CATHFLO ACTIVASE) injection 2 mg  2 mg Intracatheter Once PRN Colon Flattery, NP       Ampicillin-Sulbactam (UNASYN) 3 g in sodium chloride 0.9 % 100 mL IVPB  3 g Intravenous Q24H Chinita Greenland A, RPH 200 mL/hr at 06/04/22 1952 3 g at 06/04/22 1952   anticoagulant sodium citrate solution 5 mL  5 mL Intracatheter PRN Colon Flattery, NP       apixaban Arne [place]) tablet 2.5 mg  2.5 mg Oral BID Loletha Grayer, MD   2.5 mg at 06/05/22 1351   ascorbic acid (VITAMIN C) tablet 500 mg  500 mg Oral BID Loletha Grayer, MD   500 mg at 06/05/22 5009   atorvastatin (LIPITOR) tablet 40 mg  40 mg Oral Daily Gilles Chiquito B, MD   40 mg at 06/05/22 1202   Chlorhexidine Gluconate Cloth 2 % PADS 6 each  6 each Topical Daily Loletha Grayer, MD   6 each at 06/05/22 1206   DULoxetine (CYMBALTA) DR capsule 40 mg  40 mg Oral BID Gilles Chiquito B, MD   40 mg at 06/05/22 3818   feeding supplement (NEPRO CARB STEADY) liquid 237 mL  237 mL Oral TID BM Loletha Grayer, MD   237 mL at 06/05/22 1351   haloperidol lactate (HALDOL) injection 1 mg  1 mg Intravenous Q6H PRN Loletha Grayer, MD   1 mg at 06/04/22 1658   heparin injection 1,000 Units  1,000 Units Intracatheter PRN Colon Flattery, NP        levETIRAcetam (KEPPRA) IVPB 500 mg/100 mL premix  500 mg Intravenous 2 times per day on Sun Tue Thu Sat Benita Gutter, [facility]   Stopped at 06/05/22 1010   levETIRAcetam (KEPPRA) IVPB 500 mg/100 mL premix  500 mg Intravenous 3 times per day on Mon Wed Fri Benita Gutter, RPH 400 mL/hr at 06/04/22 2214 500 mg at 06/04/22 2214   lidocaine (PF) (XYLOCAINE) 1 % injection 5 mL  5 mL Intradermal PRN Colon Flattery, NP       lidocaine-prilocaine (EMLA) cream 1 Application  1 Application Topical PRN Colon Flattery, NP       multivitamin (RENA-VIT) tablet 1 tablet  1 tablet Oral QHS Gilles Chiquito B, MD   1 tablet at 06/04/22 2210   ondansetron (ZOFRAN) tablet 4 mg  4 mg Oral Q6H PRN Sid Falcon, MD       Or   ondansetron [facility]) injection 4 mg  4 mg Intravenous Q6H PRN Sid Falcon, MD       pantoprazole (PROTONIX) EC tablet 40 mg  40 mg Oral Daily Gilles Chiquito B, MD   40 mg at 06/05/22 2993   pentafluoroprop-tetrafluoroeth (GEBAUERS) aerosol 1 Application  1  Application Topical PRN Colon Flattery, NP       senna-docusate (Senokot-S) tablet 1 tablet  1 tablet Oral QHS Sid Falcon, MD   1 tablet at 06/03/22 2155   sevelamer carbonate (RENVELA) tablet 800 mg  800 mg Oral TID WC Gilles Chiquito B, MD   800 mg at 06/05/22 1206   [START ON 06/06/2022] vancomycin (VANCOREADY) IVPB 500 mg/100 mL  500 mg Intravenous Q M,W,F-HD Chinita Greenland A, RPH       zinc sulfate capsule 220 mg  220 mg Oral Daily Loletha Grayer, MD   220 mg at 06/05/22 5638    Past Medical History:  Diagnosis Date   Abnormal stress test    s/p cath November 2013 with modest disease involving the ostial left main, proximal LAD, proximal RCA - do not appear to be hemodynamically signficant; mild LV dysfunction   Anemia    low iron   Anxiety    Arthritis    Bacteremia    Chronic systolic CHF (congestive heart failure) (HCC)    COPD (chronic obstructive pulmonary disease) ([place])    Coronary artery disease    a. per  cath report 2013, b. 09/13/2017-CABG X4 & Mitral valve repair   Depression    DVT (deep venous thrombosis) (HCC)    arm - right- finished blood thinner- not sure when it   Ejection fraction < 50%    Erectile dysfunction    penile implant   ESRD (end stage renal disease) ([place])    [facility] on a MWF schedule.      Gout    Hx: of   Hepatitis C    History of seizures    last one 2018   Hyperlipidemia    Hypertension    Mitral valve disease    s/p Mitral repair 09/13/17   Obesity    Retinopathy    Shortness of breath    Hx: of with exertion   Stroke [facility])    "several" left side weakness, speech affected   Type II or unspecified type diabetes mellitus without mention of complication, not stated as uncontrolled    adult onset    Past Surgical History:  Procedure Laterality Date   A/V FISTULAGRAM N/A 10/04/2017   Procedure: A/V FISTULAGRAM;  Surgeon: Elam Dutch, MD;  Location: [place] [facility];  Service: Cardiovascular;  Laterality: N/A;   A/V SHUNTOGRAM Left 07/12/2020   Procedure: A/V SHUNTOGRAM;  Surgeon: Serafina Mitchell, MD;  Location: [place] [facility];  Service: Cardiovascular;  Laterality: Left;   AMPUTATION Right 07/08/2021   Procedure: AMPUTATION ABOVE KNEE;  Surgeon: Elmore Guise, MD;  Location: [facility];  Service: Vascular;  Laterality: Right;   ANGIOPLASTY Left 09/24/2019   Procedure: Angioplasty Innominate Vien;  Surgeon: Serafina Mitchell, MD;  Location: [place];  Service: Vascular;  Laterality: Left;   AV FISTULA PLACEMENT Left 01/13/2013   Procedure: INSERTION OF ARTERIOVENOUS (AV) GORE-TEX GRAFT ARM;  Surgeon: Angelia Mould, MD;  Location: [place];  Service: Vascular;  Laterality: Left;   AV FISTULA PLACEMENT Left 05/05/2013   Procedure: INSERTION OF LEFT UPPER ARM  ARTERIOVENOUS GORTEX GRAFT;  Surgeon: Angelia Mould, MD;  Location: [place];  Service: Vascular;  Laterality: Left;   AV FISTULA PLACEMENT Left 11/26/2019   Procedure: INSERTION OF  ARTERIOVENOUS (AV) GORE-TEX GRAFT, left  ARM;  Surgeon: Serafina Mitchell, MD;  Location: [place];  Service: Vascular;  Laterality: Left;   [place] REMOVAL Left 03/26/2013  Procedure: REMOVAL OF  NON INCORPORATED ARTERIOVENOUS GORETEX GRAFT ([place]) left arm * repair of  left brachial artery with vein patch angioplasty.;  Surgeon: Angelia Mould, MD;  Location: [place];  Service: Vascular;  Laterality: Left;   CARDIAC CATHETERIZATION  August 26, 2012   CATARACT EXTRACTION W/ INTRAOCULAR LENS  IMPLANT, BILATERAL     COLONOSCOPY     CORONARY ARTERY BYPASS GRAFT N/A 09/13/2017   Procedure: CORONARY ARTERY BYPASS GRAFTING (CABG) times four LIMA  to LAD, SVG sequentially to OM and RAMUS Intermediate and SVG to Acute Marginal;  Surgeon: Melrose Nakayama, MD;  Location: [place];  Service: Open Heart Surgery;  Laterality: N/A;   DIALYSIS/PERMA CATHETER INSERTION N/A 02/28/2021   Procedure: DIALYSIS/PERMA CATHETER EXCHANGE;  Surgeon: Katha Cabal, MD;  Location: [place] [facility];  Service: Cardiovascular;  Laterality: N/A;   EMBOLECTOMY Right 08/27/2013   Procedure: EMBOLECTOMY;  Surgeon: Mal Misty, MD;  Location: [place];  Service: Vascular;  Laterality: Right;  Thrombectomy of Radial and ulnar artery.   ENDOVASCULAR STENT INSERTION Left 09/24/2019   Procedure: Endovascular Stent Graft Insertion, Arm Graft;  Surgeon: Serafina Mitchell, MD;  Location: [facility];  Service: Vascular;  Laterality: Left;   EYE SURGERY     laser.  and surgery for DM   fistula     RUE and wrist   FISTULOGRAM Left 09/24/2019   Procedure: SHUNTOGRAM OF ARTERIOVENOUS FISTULA LEFT ARM,;  Surgeon: Serafina Mitchell, MD;  Location: [place];  Service: Vascular;  Laterality: Left;   INSERTION OF DIALYSIS CATHETER Right 01/01/2013   Procedure: INSERTION OF DIALYSIS CATHETER right  internal jugular;  Surgeon: Serafina Mitchell, MD;  Location: [place];  Service: Vascular;  Laterality: Right;   INSERTION OF DIALYSIS CATHETER N/A  03/26/2013   Procedure: INSERTION OF DIALYSIS CATHETER Left internal jugular vein;  Surgeon: Angelia Mould, MD;  Location: [place];  Service: Vascular;  Laterality: N/A;   INSERTION OF DIALYSIS CATHETER Right 11/26/2019   Procedure: INSERTION OF DIALYSIS CATHETER, right internal jugular;  Surgeon: Serafina Mitchell, MD;  Location: [place];  Service: Vascular;  Laterality: Right;   IR THORACENTESIS ASP PLEURAL SPACE W/IMG GUIDE  09/30/2019   LEFT HEART CATH AND CORONARY ANGIOGRAPHY N/A 09/09/2017   Procedure: LEFT HEART CATH AND CORONARY ANGIOGRAPHY;  Surgeon: Troy Sine, MD;  Location: [place] [facility];  Service: Cardiovascular;  Laterality: N/A;   LIGATION OF ARTERIOVENOUS  FISTULA Right 12/30/2013   Procedure: REMOVAL OF SEGMENT OF GORTEX GRAFT AND FISTULA  AND REPAIR OF BRACHIAL ARTERY;  Surgeon: Mal Misty, MD;  Location: [place];  Service: Vascular;  Laterality: Right;   LOWER EXTREMITY ANGIOGRAPHY N/A 06/28/2021   Procedure: Lower Extremity Angiography;  Surgeon: Katha Cabal, MD;  Location: [place] [facility];  Service: Cardiovascular;  Laterality: N/A;   LOWER EXTREMITY ANGIOGRAPHY Left 11/23/2021   Procedure: LOWER EXTREMITY ANGIOGRAPHY;  Surgeon: Algernon Huxley, MD;  Location: [place] [facility];  Service: Cardiovascular;  Laterality: Left;   MITRAL VALVE REPAIR N/A 09/13/2017   Procedure: MITRAL VALVE REPAIR (MVR);  Surgeon: Melrose Nakayama, MD;  Location: [place];  Service: Open Heart Surgery;  Laterality: N/A;   MITRAL VALVE REPAIR (MV)/CORONARY ARTERY BYPASS GRAFTING (CABG)  09/13/2017   PENILE PROSTHESIS IMPLANT     1997.. no card   PERIPHERAL VASCULAR BALLOON ANGIOPLASTY Left 10/04/2017   Procedure: PERIPHERAL VASCULAR BALLOON ANGIOPLASTY;  Surgeon: Elam Dutch, MD;  Location: [place] [facility];  Service: Cardiovascular;  Laterality: Left;  AVF   PERIPHERAL VASCULAR BALLOON ANGIOPLASTY Left 07/12/2020   Procedure: PERIPHERAL VASCULAR BALLOON ANGIOPLASTY;   Surgeon: Serafina Mitchell, MD;  Location: [place] [facility];  Service: Cardiovascular;  Laterality: Left;  ARM SHUNTOGRAM   REVISION OF ARTERIOVENOUS GORETEX GRAFT Left 09/24/2019   Procedure: REVISION OF ARTERIOVENOUS GORETEX GRAFT LEFT ARM;  Surgeon: Serafina Mitchell, MD;  Location: [facility];  Service: Vascular;  Laterality: Left;   TEE WITHOUT CARDIOVERSION N/A 06/11/2013   Procedure: TRANSESOPHAGEAL ECHOCARDIOGRAM (TEE);  Surgeon: Larey Dresser, MD;  Location[facility] ENDOSCOPY;  Service: Cardiovascular;  Laterality: N/A;   TEE WITHOUT CARDIOVERSION N/A 09/13/2017   Procedure: TRANSESOPHAGEAL ECHOCARDIOGRAM (TEE);  Surgeon: Melrose Nakayama, MD;  Location: [place];  Service: Open Heart Surgery;  Laterality: N/A;   THROMBECTOMY W/ EMBOLECTOMY Left 03/26/2013   Procedure: Attempted thrombectomy of left arm arteriovenous goretex graft.;  Surgeon: Angelia Mould, MD;  Location: [facility] OR;  Service: Vascular;  Laterality: Left;   ULTRASOUND GUIDANCE FOR VASCULAR ACCESS Bilateral 09/24/2019   Procedure: Ultrasound Guidance For Vascular Access, Right Femoral Vein and Left Arm Graft;  Surgeon: Serafina Mitchell, MD;  Location: [facility] OR;  Service: Vascular;  Laterality: Bilateral;   VENOGRAM N/A 04/07/2013   Procedure: VENOGRAM;  Surgeon: Serafina Mitchell, MD;  Location: [facility] CATH LAB;  Service: Cardiovascular;  Laterality: N/A;    Social History Social History   Tobacco Use   Smoking status: Former    Years: 7.00    Types: Cigarettes    Quit date: 06/10/1973    Years since quitting: 49.0   Smokeless tobacco: Never   Tobacco comments:    Quit in 1974.  Vaping Use   Vaping Use: Never used  Substance Use Topics   Alcohol use: No    Alcohol/week: 0.0 standard drinks of alcohol   Drug use: No    Family History Family History  Problem Relation Age of Onset   Heart disease Father    Diabetes Sister    Diabetes Brother    Diabetes Son     [name]   REVIEW OF SYSTEMS (Negative  unless checked)  Constitutional: [] Weight loss  [] Fever  [] Chills Cardiac: [] Chest pain   [] Chest pressure   [] Palpitations   [] Shortness of breath when laying flat   [] Shortness of breath at rest   [] Shortness of breath with exertion. Vascular:  [] Pain in legs with walking   [] Pain in legs at rest   [] Pain in legs when laying flat   [] Claudication   [] Pain in feet when walking  [] Pain in feet at rest  [] Pain in feet when laying flat   [] History of DVT   [] Phlebitis   [] Swelling in legs   [] Varicose veins   [] Non-healing ulcers Pulmonary:   [] Uses home oxygen   [] Productive cough   [] Hemoptysis   [] Wheeze  [] COPD   [] Asthma Neurologic:  [] Dizziness  [] Blackouts   [] Seizures   [] History of stroke   [] History of TIA  [] Aphasia   [] Temporary blindness   [] Dysphagia   [] Weakness or numbness in arms   [x] Weakness or numbness in legs Musculoskeletal:  [] Arthritis   [] Joint swelling   [] Joint pain   [] Low back pain Hematologic:  [] Easy bruising  [] Easy bleeding   [] Hypercoagulable state   [] Anemic  [] Hepatitis Gastrointestinal:  [] Blood in stool   [] Vomiting blood  [] Gastroesophageal reflux/heartburn   [] Difficulty swallowing. Genitourinary:  [] Chronic kidney  disease   [] Difficult urination  [] Frequent urination  [] Burning with urination   [] Blood in urine Skin:  [] Rashes   [] Ulcers   [x] Wounds Psychological:  [] History of anxiety   []  History of major depression.  Physical Examination  Vitals:   06/04/22 2130 06/04/22 2130 06/05/22 0551 06/05/22 0829  BP: 126/78 126/78 113/61 113/64  Pulse: (!) 109 (!) 107 (!) 105 (!) 103  Resp: 17 17 17 16   Temp: 97.7 F (36.5 C) 97.7 F (36.5 C) 97.6 F (36.4 C) 98 F (36.7 C)  TempSrc:      SpO2: 99% 98% 95% 98%  Weight:      Height:       Body mass index is 18.85 kg/m. Gen:  WD/WN, NAD Head: /AT, No temporalis wasting. Prominent temp pulse not noted. Ear/Nose/Throat: Hearing grossly intact, nares w/o erythema or drainage, oropharynx w/o  Erythema/Exudate Eyes: Sclera non-icteric, conjunctiva clear Neck: Trachea midline.  No JVD.  Pulmonary:  Good air movement, respirations not labored, equal bilaterally.  Cardiac: RRR, normal S1, S2. Vascular: Dry gangrene of left foot including forefoot area  Gastrointestinal: soft, non-tender/non-distended. No guarding/reflex.  Musculoskeletal: M/S 5/5 throughout.  Extremities without ischemic changes.  No deformity or atrophy. No edema. Neurologic: Sensation grossly intact in extremities.  Symmetrical.  Speech is fluent. Motor exam as listed above. Psychiatric: Judgment intact, Mood & affect appropriate for pt's clinical situation.     CBC Lab Results  Component Value Date   WBC 8.8 06/04/2022   HGB 11.1 (L) 06/04/2022   HCT 35.5 (L) 06/04/2022   MCV 79.2 (L) 06/04/2022   PLT 306 06/04/2022    BMET    Component Value Date/Time   NA 135 06/04/2022 0540   NA 140 12/11/2017 0000   K 3.2 (L) 06/04/2022 0540   CL 98 06/04/2022 0540   CO2 28 06/04/2022 0540   GLUCOSE 112 (H) 06/04/2022 0540   BUN 39 (H) 06/04/2022 0540   BUN 16 12/11/2017 0000   CREATININE 4.45 (H) 06/04/2022 0540   CALCIUM 8.3 (L) 06/04/2022 0540   CALCIUM 8.4 (L) 10/05/2017 0615   GFRNONAA 13 (L) 06/04/2022 0540   GFRAA 21 (L) 12/04/2019 2027   Estimated Creatinine Clearance: 12.1 mL/min (A) (by C-G formula based on SCr of 4.45 mg/dL (H)).  COAG Lab Results  Component Value Date   INR 4.5 (A) 12/27/2021   INR 1.1 09/25/2021   INR 1.3 (H) 07/08/2021   PROTIME 25.4 (A) 12/27/2017   PROTIME 33.5 (A) 12/24/2017   PROTIME 21.5 (A) 12/11/2017    Radiology No results found.    Assessment/Plan 1.  Gangrenous left foot  The patient recently seen and evaluated by psychiatry and he is deemed competent and able to make his own medical decisions.  This is second in by the patient's daughter.  She notes that while she does hold power of attorney she does not hold a healthcare power of attorney and  currently he makes his own medical decisions.  In discussion with the patient he does not wish to move forward with amputation of his left lower extremity at this time.  I discussed with the patient that he has dry gangrene which is stable currently.  However, dry gangrene often progresses to a wet gangrene and at which time it could begin to develop serious infection which may lead to sepsis and severe systemic infection.  At which time in order to heal the infection the patient would need to have the amputation done.  He again does not wish to move forward with amputation.  We will continue to follow and can follow-up on an outpatient basis as well. 2.  End-stage renal disease Patient has been refusing dialysis.  If he continues to refuse this may affect his decision making capacity.  Nephrology will continue to follow.   Family Communication:  Thank you for allowing [place] to participate in the care of this patient.   Kris Hartmann, NP [facility] [phone] (Office Phone) [phone] (Office Fax) [phone] (Pager)  06/05/2022 2:18 PM  Staff may message me via secure chat in [place]  but this may not receive immediate response,  please page for urgent matters!  Dictation software was used to generate the above note. Typos may occur and escape review, as with typed/written notes. Any error is purely unintentional.  Please contact me directly for clarity if needed.

## 2022-06-05 NOTE — Hospital Course (Addendum)
76 year old man with chronic systolic congestive heart failure, COPD, end-stage renal disease, hepatitis C, hypertension, stroke.  Patient brought in by family secondary to gangrene left foot.  Started empirically on antibiotics.  On 06/04/2022 patient refused dialysis and refused medications.  Patient declined amputation at this time.  Appreciate palliative care consultation and patient was made a DO NOT RESUSCITATE. Patient is seen by palliative care for poor prognosis.  8/23.  Patient was agreeable and dialyzed today. 8/25.  Left AKA 8/29: Patient was hypotensive requiring transfer to ICU for pressors 8/30: Weaned off pressor, DNR, troponin trending up 8/31: Daughter is not ready for comfort care/hospice yet and wants aggressive management 9/2: Still in stepdown, no significant change. May need second agent for hospital associated aspiration.   9/5: GOC discussed with palliative, family.  Transitioned to comfort care.  No further dialysis  9/6: approved for residential hospice, awaiting bed and family consent for d/c

## 2022-06-05 NOTE — Consult Note (Signed)
[facility] Consult   Reason for Consult:  aggressive with nursing staff reported on 8/21 Referring Physician:  Leslye Peer Patient Identification: Ernest [name] MRN:  [id number] Principal Diagnosis: Adjustment reaction with aggression Diagnosis:  Principal Problem:   Adjustment reaction with aggression Active Problems:   Chronic HFrEF (heart failure with reduced ejection fraction) ([place])   History of CVA (cerebrovascular accident)   Seizure, late effect of stroke ([place])   Essential hypertension   End-stage renal disease on hemodialysis ([place])   Vascular dementia ([place])   Atrial flutter ([place])   Anemia of chronic disease   Secondary hyperparathyroidism of renal origin ([place])   Atherosclerosis of native arteries of the extremities with gangrene ([place])   Pressure injury of skin   Acquired absence of right leg above knee ([place])   Dry gangrene ([place])   Impaired fasting glucose   Hypokalemia   Protein-calorie malnutrition, severe   Total Time spent with patient: 45 minutes  Subjective:   Ernest [name] is a 76 y.o. male patient admitted with see above.  HPI:  Patient seen face-to-face as consult due to him having some verbally aggressive remarks with nursing staff on 06/04/22. Patient is hospitalized with medical problems as above and, per chart review, will eventually need amputation of left lower limb.   On evaluation, patient is in his bed. Is alert and oriented to person, place, time, situation. He is able to tell me the year, his age and date of birth; says that he is in the hospital in [place]. He is aware of the situation of his leg having "infection." When I asked patient if he is going to continue dialysis, he states that he is. He states that he is eating more now (there is some type of thick beverage at his bedside, along with water; patient asked me for a drink of water, which I assisted him with).    Patient is calm, pleasant, interacts appropriately. He denies any  suicidal or homicidal thoughts. Denies auditory or visual hallucinations and does not appear to be responding to internal stimuli. His speech is somewhat slurred, appears to be at baseline perhaps due to previous stroke and poor dentition. Per secure chat with his bedside RN, she tells me he has cooperative and pleasant this morning, no aggression.    Past Psychiatric History: none reported  Risk to Self:   Risk to Others:   Prior Inpatient Therapy:   Prior Outpatient Therapy:    Past Medical History:  Past Medical History:  Diagnosis Date   Abnormal stress test    s/p cath November 2013 with modest disease involving the ostial left main, proximal LAD, proximal RCA - do not appear to be hemodynamically signficant; mild LV dysfunction   Anemia    low iron   Anxiety    Arthritis    Bacteremia    Chronic systolic CHF (congestive heart failure) (HCC)    COPD (chronic obstructive pulmonary disease) ([place])    Coronary artery disease    a. per cath report 2013, b. 09/13/2017-CABG X4 & Mitral valve repair   Depression    DVT (deep venous thrombosis) (HCC)    arm - right- finished blood thinner- not sure when it   Ejection fraction < 50%    Erectile dysfunction    penile implant   ESRD (end stage renal disease) ([place])    [facility] on a MWF schedule.      Gout    Hx: of   Hepatitis C  History of seizures    last one 2018   Hyperlipidemia    Hypertension    Mitral valve disease    s/p Mitral repair 09/13/17   Obesity    Retinopathy    Shortness of breath    Hx: of with exertion   Stroke [facility])    "several" left side weakness, speech affected   Type II or unspecified type diabetes mellitus without mention of complication, not stated as uncontrolled    adult onset    Past Surgical History:  Procedure Laterality Date   A/V FISTULAGRAM N/A 10/04/2017   Procedure: A/V FISTULAGRAM;  Surgeon: Elam Dutch, MD;  Location: [facility];  Service: Cardiovascular;   Laterality: N/A;   A/V SHUNTOGRAM Left 07/12/2020   Procedure: A/V SHUNTOGRAM;  Surgeon: Serafina Mitchell, MD;  Location: [facility];  Service: Cardiovascular;  Laterality: Left;   AMPUTATION Right 07/08/2021   Procedure: AMPUTATION ABOVE KNEE;  Surgeon: Elmore Guise, MD;  Location: [facility];  Service: Vascular;  Laterality: Right;   ANGIOPLASTY Left 09/24/2019   Procedure: Angioplasty Innominate Vien;  Surgeon: Serafina Mitchell, MD;  Location: [facility];  Service: Vascular;  Laterality: Left;   AV FISTULA PLACEMENT Left 01/13/2013   Procedure: INSERTION OF ARTERIOVENOUS (AV) GORE-TEX GRAFT ARM;  Surgeon: Angelia Mould, MD;  Location: [facility];  Service: Vascular;  Laterality: Left;   AV FISTULA PLACEMENT Left 05/05/2013   Procedure: INSERTION OF LEFT UPPER ARM  ARTERIOVENOUS GORTEX GRAFT;  Surgeon: Angelia Mould, MD;  Location: [facility];  Service: Vascular;  Laterality: Left;   AV FISTULA PLACEMENT Left 11/26/2019   Procedure: INSERTION OF ARTERIOVENOUS (AV) GORE-TEX GRAFT, left  ARM;  Surgeon: Serafina Mitchell, MD;  Location: [facility];  Service: Vascular;  Laterality: Left;   [place] REMOVAL Left 03/26/2013   Procedure: REMOVAL OF  NON INCORPORATED ARTERIOVENOUS GORETEX GRAFT ([place]) left arm * repair of  left brachial artery with vein patch angioplasty.;  Surgeon: Angelia Mould, MD;  Location: [facility];  Service: Vascular;  Laterality: Left;   CARDIAC CATHETERIZATION  August 26, 2012   CATARACT EXTRACTION W/ INTRAOCULAR LENS  IMPLANT, BILATERAL     COLONOSCOPY     CORONARY ARTERY BYPASS GRAFT N/A 09/13/2017   Procedure: CORONARY ARTERY BYPASS GRAFTING (CABG) times four LIMA  to LAD, SVG sequentially to OM and RAMUS Intermediate and SVG to Acute Marginal;  Surgeon: Melrose Nakayama, MD;  Location: [place] [facility];  Service: Open Heart Surgery;  Laterality: N/A;   DIALYSIS/PERMA CATHETER INSERTION N/A 02/28/2021   Procedure: DIALYSIS/PERMA CATHETER EXCHANGE;  Surgeon: Katha Cabal,  MD;  Location: [place] [facility];  Service: Cardiovascular;  Laterality: N/A;   EMBOLECTOMY Right 08/27/2013   Procedure: EMBOLECTOMY;  Surgeon: Mal Misty, MD;  Location: [facility];  Service: Vascular;  Laterality: Right;  Thrombectomy of Radial and ulnar artery.   ENDOVASCULAR STENT INSERTION Left 09/24/2019   Procedure: Endovascular Stent Graft Insertion, Arm Graft;  Surgeon: Serafina Mitchell, MD;  Location[facility];  Service: Vascular;  Laterality: Left;   EYE SURGERY     laser.  and surgery for DM   fistula     RUE and wrist   FISTULOGRAM Left 09/24/2019   Procedure: SHUNTOGRAM OF ARTERIOVENOUS FISTULA LEFT ARM,;  Surgeon: Serafina Mitchell, MD;  Location: [facility] OR;  Service: Vascular;  Laterality: Left;   INSERTION OF DIALYSIS CATHETER Right 01/01/2013   Procedure: INSERTION OF DIALYSIS CATHETER right  internal jugular;  Surgeon: Serafina Mitchell, MD;  Location: [facility];  Service: Vascular;  Laterality: Right;   INSERTION OF DIALYSIS CATHETER N/A 03/26/2013   Procedure: INSERTION OF DIALYSIS CATHETER Left internal jugular vein;  Surgeon: Angelia Mould, MD;  Location: [facility];  Service: Vascular;  Laterality: N/A;   INSERTION OF DIALYSIS CATHETER Right 11/26/2019   Procedure: INSERTION OF DIALYSIS CATHETER, right internal jugular;  Surgeon: Serafina Mitchell, MD;  Location: [place];  Service: Vascular;  Laterality: Right;   IR THORACENTESIS ASP PLEURAL SPACE W/IMG GUIDE  09/30/2019   LEFT HEART CATH AND CORONARY ANGIOGRAPHY N/A 09/09/2017   Procedure: LEFT HEART CATH AND CORONARY ANGIOGRAPHY;  Surgeon: Troy Sine, MD;  Location: [facility];  Service: Cardiovascular;  Laterality: N/A;   LIGATION OF ARTERIOVENOUS  FISTULA Right 12/30/2013   Procedure: REMOVAL OF SEGMENT OF GORTEX GRAFT AND FISTULA  AND REPAIR OF BRACHIAL ARTERY;  Surgeon: Mal Misty, MD;  Location: [facility];  Service: Vascular;  Laterality: Right;   LOWER EXTREMITY ANGIOGRAPHY N/A 06/28/2021   Procedure: Lower  Extremity Angiography;  Surgeon: Katha Cabal, MD;  Location: [facility];  Service: Cardiovascular;  Laterality: N/A;   LOWER EXTREMITY ANGIOGRAPHY Left 11/23/2021   Procedure: LOWER EXTREMITY ANGIOGRAPHY;  Surgeon: Algernon Huxley, MD;  Location: [facility];  Service: Cardiovascular;  Laterality: Left;   MITRAL VALVE REPAIR N/A 09/13/2017   Procedure: MITRAL VALVE REPAIR (MVR);  Surgeon: Melrose Nakayama, MD;  Location: [facility];  Service: Open Heart Surgery;  Laterality: N/A;   MITRAL VALVE REPAIR (MV)/CORONARY ARTERY BYPASS GRAFTING (CABG)  09/13/2017   PENILE PROSTHESIS IMPLANT     1997.. no card   PERIPHERAL VASCULAR BALLOON ANGIOPLASTY Left 10/04/2017   Procedure: PERIPHERAL VASCULAR BALLOON ANGIOPLASTY;  Surgeon: Elam Dutch, MD;  Location: [facility] [place] [facility];  Service: Cardiovascular;  Laterality: Left;  AVF   PERIPHERAL VASCULAR BALLOON ANGIOPLASTY Left 07/12/2020   Procedure: PERIPHERAL VASCULAR BALLOON ANGIOPLASTY;  Surgeon: Serafina Mitchell, MD;  Location: [facility] [facility];  Service: Cardiovascular;  Laterality: Left;  ARM SHUNTOGRAM   REVISION OF ARTERIOVENOUS GORETEX GRAFT Left 09/24/2019   Procedure: REVISION OF ARTERIOVENOUS GORETEX GRAFT LEFT ARM;  Surgeon: Serafina Mitchell, MD;  Location[facility];  Service: Vascular;  Laterality: Left;   TEE WITHOUT CARDIOVERSION N/A 06/11/2013   Procedure: TRANSESOPHAGEAL ECHOCARDIOGRAM (TEE);  Surgeon: Larey Dresser, MD;  Location: [facility];  Service: Cardiovascular;  Laterality: N/A;   TEE WITHOUT CARDIOVERSION N/A 09/13/2017   Procedure: TRANSESOPHAGEAL ECHOCARDIOGRAM (TEE);  Surgeon: Melrose Nakayama, MD;  Location: [facility];  Service: Open Heart Surgery;  Laterality: N/A;   THROMBECTOMY W/ EMBOLECTOMY Left 03/26/2013   Procedure: Attempted thrombectomy of left arm arteriovenous goretex graft.;  Surgeon: Angelia Mould, MD;  Location: [facility] OR;  Service: Vascular;  Laterality: Left;   ULTRASOUND  GUIDANCE FOR VASCULAR ACCESS Bilateral 09/24/2019   Procedure: Ultrasound Guidance For Vascular Access, Right Femoral Vein and Left Arm Graft;  Surgeon: Serafina Mitchell, MD;  Location: [facility] OR;  Service: Vascular;  Laterality: Bilateral;   VENOGRAM N/A 04/07/2013   Procedure: VENOGRAM;  Surgeon: Serafina Mitchell, MD;  Location: [facility] CATH LAB;  Service: Cardiovascular;  Laterality: N/A;   Family History:  Family History  Problem Relation Age of Onset   Heart disease Father    Diabetes Sister    Diabetes Brother    Diabetes Son    Family  Psychiatric  History:  Social History:  Social History   Substance and Sexual Activity  Alcohol Use No   Alcohol/week: 0.0 standard drinks of alcohol     Social History   Substance and Sexual Activity  Drug Use No    Social History   Socioeconomic History   Marital status: Widowed    Spouse name: Not on file   Number of children: Not on file   Years of education: Not on file   Highest education level: Not on file  Occupational History   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  Vaping Use   Vaping Use: Never used  Substance and Sexual Activity   Alcohol use: No    Alcohol/week: 0.0 standard drinks of alcohol   Drug use: No   Sexual activity: Not on file  Other Topics Concern   Not on file  Social History Narrative   Adopted mentally retarded child at home, 2 adopted children, 3 living and 2 deceased.   Social Determinants of Health   Financial Resource Strain: Not on file  Food Insecurity: Not on file  Transportation Needs: Not on file  Physical Activity: Not on file  Stress: Not on file  Social Connections: Not on file   Additional Social History:    Allergies:  [name]  Labs:  Results  for orders placed or performed during the hospital encounter of 06/02/22 (from the past 48 hour(s))  Glucose, capillary     Status: Abnormal   Collection Time: 06/03/22  4:44 PM  Result Value Ref Range   Glucose-Capillary 153 (H) 70 - 99 mg/dL    Comment: Glucose reference range applies only to samples taken after fasting for at least 8 hours.  Glucose, capillary     Status: Abnormal   Collection Time: 06/03/22  8:52 PM  Result Value Ref Range   Glucose-Capillary 157 (H) 70 - 99 mg/dL    Comment: Glucose reference range applies only to samples taken after fasting for at least 8 hours.  Hepatitis B surface antigen     Status: None   Collection Time: 06/04/22  5:40 AM  Result Value Ref Range   Hepatitis B Surface Ag NON REACTIVE NON REACTIVE    Comment: Performed at [facility], [address] [place], [place] [address]  Hepatitis B surface antibody     Status: Abnormal   Collection Time: 06/04/22  5:40 AM  Result Value Ref Range   Hep B S Ab Reactive (A) NON REACTIVE    Comment: (NOTE) Consistent with immunity, greater than 9.9 mIU/mL.  Performed at [facility], [place] [address], [place], [place] [address]   Hepatitis B surface antibody,quantitative     Status: None   Collection Time: 06/04/22  5:40 AM  Result Value Ref Range   Hep B S AB Quant (Post) 445.5 Immunity>9.9 mIU/mL    Comment: (NOTE)  Status of Immunity                     Anti-HBs Level  ------------------                     --------------  Inconsistent with Immunity                   0.0 - 9.9 Consistent with Immunity                          >9.9 Performed At: [facility] [place], [place] [id number] Rush Farmer MD XN:[id number]   Hepatitis B core antibody, total     Status: None   Collection Time: 06/04/22  5:40 AM  Result Value Ref Range   Hep B Core Total Ab NON REACTIVE NON REACTIVE    Comment: Performed at [facility], [address], [place], [place]  [address]  Hepatitis C antibody     Status: None   Collection Time: 06/04/22  5:40 AM  Result Value Ref Range   HCV Ab NON REACTIVE NON REACTIVE    Comment: (NOTE) Nonreactive HCV antibody screen is consistent with no HCV infections,  unless recent infection is suspected or other evidence exists to indicate HCV infection.  Performed at [facility], [place] [address] [place], [place] [address]   Renal function panel     Status: Abnormal   Collection Time: 06/04/22  5:40 AM  Result Value Ref Range   Sodium 135 135 - 145 mmol/L   Potassium 3.2 (L) 3.5 - 5.1 mmol/L   Chloride 98 98 - 111 mmol/L   CO2 28 22 - 32 mmol/L   Glucose, Bld 112 (H) 70 - 99 mg/dL    Comment: Glucose reference range applies only to samples taken after fasting for at least 8 hours.   BUN 39 (H) 8 - 23 mg/dL   Creatinine, Ser 4.45 (H) 0.61 - 1.24 mg/dL   Calcium 8.3 (L) 8.9 - 10.3 mg/dL   Phosphorus 2.8 2.5 - 4.6 mg/dL   Albumin 2.3 (L) 3.5 - 5.0 g/dL   GFR, Estimated 13 (L) >60 mL/min    Comment: (NOTE) Calculated using the CKD-EPI Creatinine Equation (2021)    Anion gap 9 5 - 15    Comment: Performed at [facility], [address] [place], [place] [address]  CBC     Status: Abnormal   Collection Time: 06/04/22  5:40 AM  Result Value Ref Range   WBC 8.8 4.0 - 10.5 K/uL   RBC 4.48 4.22 - 5.81 MIL/uL   Hemoglobin 11.1 (L) 13.0 - 17.0 g/dL   HCT 35.5 (L) 39.0 - 52.0 %   MCV 79.2 (L) 80.0 - 100.0 fL   MCH 24.8 (L) 26.0 - 34.0 pg   MCHC 31.3 30.0 - 36.0 g/dL   RDW 19.0 (H) 11.5 - 15.5 %   Platelets 306 150 - 400 K/uL   nRBC 0.0 0.0 - 0.2 %    Comment: Performed at [facility], [address] [place], [place] [address]  Glucose, capillary     Status: Abnormal   Collection Time: 06/04/22 10:09 PM  Result Value Ref Range   Glucose-Capillary 148 (H) 70 - 99 mg/dL    Comment: Glucose reference range applies only to samples taken after fasting for at least 8 hours.    Current  Facility-Administered Medications  Medication Dose Route Frequency Provider Last Rate Last Admin   (feeding supplement) PROSource Plus liquid 30 mL  30 mL Oral TID BM Wieting, Richard, MD   30 mL at 06/05/22 1205   acetaminophen (TYLENOL) tablet 1,000 mg  1,000 mg Oral Q6H PRN Daryll Drown,  Peri Jefferson, MD       alteplase (CATHFLO ACTIVASE) injection 2 mg  2 mg Intracatheter Once PRN Colon Flattery, NP       Ampicillin-Sulbactam (UNASYN) 3 g in sodium chloride 0.9 % 100 mL IVPB  3 g Intravenous Q24H Chinita Greenland A, RPH 200 mL/hr at 06/04/22 1952 3 g at 06/04/22 1952   anticoagulant sodium citrate solution 5 mL  5 mL Intracatheter PRN Colon Flattery, NP       ascorbic acid (VITAMIN C) tablet 500 mg  500 mg Oral BID Loletha Grayer, MD   500 mg at 06/05/22 1856   atorvastatin (LIPITOR) tablet 40 mg  40 mg Oral Daily Gilles Chiquito B, MD   40 mg at 06/05/22 1202   Chlorhexidine Gluconate Cloth 2 % PADS 6 each  6 each Topical Daily Loletha Grayer, MD   6 each at 06/05/22 1206   DULoxetine (CYMBALTA) DR capsule 40 mg  40 mg Oral BID Gilles Chiquito B, MD   40 mg at 06/05/22 3149   feeding supplement (NEPRO CARB STEADY) liquid 237 mL  237 mL Oral TID BM Loletha Grayer, MD   237 mL at 06/05/22 0936   haloperidol lactate (HALDOL) injection 1 mg  1 mg Intravenous Q6H PRN Loletha Grayer, MD   1 mg at 06/04/22 1658   heparin injection 1,000 Units  1,000 Units Intracatheter PRN Colon Flattery, NP       levETIRAcetam (KEPPRA) IVPB 500 mg/100 mL premix  500 mg Intravenous 2 times per day on Sun Tue Thu Sat Benita Gutter, [place] at 06/05/22 1010   levETIRAcetam (KEPPRA) IVPB 500 mg/100 mL premix  500 mg Intravenous 3 times per day on Mon Wed Fri Benita Gutter, RPH 400 mL/hr at 06/04/22 2214 500 mg at 06/04/22 2214   lidocaine (PF) (XYLOCAINE) 1 % injection 5 mL  5 mL Intradermal PRN Colon Flattery, NP       lidocaine-prilocaine (EMLA) cream 1 Application  1 Application Topical PRN Colon Flattery, NP       multivitamin (RENA-VIT) tablet 1 tablet  1 tablet Oral QHS Gilles Chiquito B, MD   1 tablet at 06/04/22 2210   ondansetron (ZOFRAN) tablet 4 mg  4 mg Oral Q6H PRN Sid [place], MD       Or   ondansetron [facility]) injection 4 mg  4 mg Intravenous Q6H PRN Sid [place], MD       pantoprazole (PROTONIX) EC tablet 40 mg  40 mg Oral Daily Gilles Chiquito B, MD   40 mg at 06/05/22 7026   pentafluoroprop-tetrafluoroeth (GEBAUERS) aerosol 1 Application  1 Application Topical PRN Colon Flattery, NP       senna-docusate (Senokot-S) tablet 1 tablet  1 tablet Oral QHS Gilles Chiquito B, MD   1 tablet at 06/03/22 2155   sevelamer carbonate (RENVELA) tablet 800 mg  800 mg Oral TID WC Gilles Chiquito B, MD   800 mg at 06/05/22 1206   [START ON 06/06/2022] vancomycin (VANCOREADY) IVPB 500 mg/100 mL  500 mg Intravenous Q M,W,F-HD Chinita Greenland A, RPH       zinc sulfate capsule 220 mg  220 mg Oral Daily Loletha Grayer, MD   220 mg at 06/05/22 3785    Musculoskeletal: Strength & Muscle Tone: decreased Gait & Station:  not observed Patient leans: N/A            Psychiatric Specialty Exam:  Presentation  General Appearance: No data recorded  Eye Contact:No data recorded Speech:No data recorded Speech Volume:No data recorded Handedness:No data recorded  Mood and Affect  Mood:No data recorded Affect:No data recorded  Thought Process  Thought Processes:No data recorded Descriptions of Associations:No data recorded Orientation:No data recorded Thought Content:No data recorded History of Schizophrenia/Schizoaffective disorder:No data recorded Duration of Psychotic Symptoms:No data recorded Hallucinations:No data recorded Ideas of Reference:No data recorded Suicidal Thoughts:No data recorded Homicidal Thoughts:No data recorded  Sensorium  Memory:No data recorded Judgment:No data recorded Insight:No data recorded  Executive Functions  Concentration:No data  recorded Attention Span:No data recorded Recall:No data recorded Fund of Knowledge:No data recorded Language:No data recorded  Psychomotor Activity  Psychomotor Activity:No data recorded  Assets  Assets:No data recorded  Sleep  Sleep:No data recorded  Physical Exam: Physical Exam Vitals and nursing note reviewed.  HENT:     Head: Normocephalic.     Nose: No congestion or rhinorrhea.  Eyes:     General:        Right eye: No discharge.        Left eye: No discharge.  Pulmonary:     Effort: Pulmonary effort is normal.  Skin:    General: Skin is dry.  Neurological:     Mental Status: He is alert and oriented to person, place, and time.  Psychiatric:        Attention and Perception: Attention normal.        Mood and Affect: Mood normal.        Speech: Speech normal.        Behavior: Behavior normal. Behavior is cooperative.        Thought Content: Thought content normal. Thought content is not paranoid or delusional. Thought content does not include homicidal or suicidal ideation.        Cognition and Memory: Cognition normal.        Judgment: Judgment normal.   Review of Systems  Respiratory: Negative.    Musculoskeletal: Negative.   Neurological: Negative.    Blood pressure 113/64, pulse (!) 103, temperature 98 F (36.7 C), resp. rate 16, height 5\' 9"  (1.753 m), weight 57.9 kg, SpO2 98 %. Body mass index is 18.85 kg/m.  Treatment Plan Summary: Plan No indication for medication for aggression. If aggressive behaviors  recur, risperidone may be appropriate. Consult psychiatry as needed.   Disposition: No evidence of imminent risk to self or others at present.   Patient does not meet criteria for psychiatric inpatient admission. Supportive therapy provided about ongoing stressors.  Sherlon Handing, NP 06/05/2022 12:46 PM

## 2022-06-05 NOTE — Consult Note (Signed)
WOC by to assess gluteal cleft wound and RN requested me to wait, patient has become confused and restless.  I will follow up 8/22  [facility], [place], [place]

## 2022-06-05 NOTE — Progress Notes (Signed)
Progress Note   Patient: Ernest [name]:[id number] DOB: 1946-06-21 DOA: 06/02/2022     3 DOS: the patient was seen and examined on 06/05/2022   Brief hospital course: 76 year old man with chronic systolic congestive heart failure, COPD, end-stage renal disease, hepatitis C, hypertension, stroke.  Patient brought in by family secondary to gangrene left foot.  Started empirically on antibiotics.  On 06/04/2022 patient refused dialysis and refused medications.  Patient declined amputation at this time.  Appreciate palliative care consultation and patient was made a DO NOT RESUSCITATE.  Assessment and Plan: Dry gangrene ([place]) Gangrene left foot.  Patient declined surgery at this time.  Will restart Eliquis.  Patient started on empiric antibiotics and may need lifelong suppressive antibiotic therapy.  Appreciate palliative care consultation.  Patient is a DNR.  End-stage renal disease on hemodialysis [facility]) Patient refused hemodialysis yesterday.  Vascular dementia ([place]) Yesterday afternoon patient became more aggressive with staff and with me when I reevaluated.  Pressure injury of skin Wound care consultation.  Stage II gluteal cleft.  Secondary hyperparathyroidism of renal origin ([place]) PTH 534, phosphorus 4.5 and calcium 8.0 on outpatient labs.  Protein-calorie malnutrition, severe Supplements.  Hypokalemia Replaced on admission I will not replace any further being that the patient is a dialysis patient.  Impaired fasting glucose Hemoglobin A1c low at 4.7.  Patient is not a diabetic.  Discontinue fingersticks.  Anemia of chronic disease Hemoglobin 11.1  Seizure, late effect of stroke ([place]) On Keppra (changed to IV because he refused oral meds yesterday)  Chronic HFrEF (heart failure with reduced ejection fraction) (HCC) No signs of heart failure.  Dialysis to remove fluid       Subjective: Patient feels okay.  More cooperative today than yesterday.  Feels okay.  No pain  in his foot.  Admitted with gangrene of his foot.  Physical Exam: Vitals:   06/04/22 2130 06/04/22 2130 06/05/22 0551 06/05/22 0829  BP: 126/78 126/78 113/61 113/64  Pulse: (!) 109 (!) 107 (!) 105 (!) 103  Resp: 17 17 17 16   Temp: 97.7 F (36.5 C) 97.7 F (36.5 C) 97.6 F (36.4 C) 98 F (36.7 C)  TempSrc:      SpO2: 99% 98% 95% 98%  Weight:      Height:       Physical Exam HENT:     Head: Normocephalic.     Mouth/Throat:     Pharynx: No oropharyngeal exudate.  Eyes:     General: Lids are normal.     Conjunctiva/sclera: Conjunctivae normal.  Cardiovascular:     Rate and Rhythm: Normal rate and regular rhythm.     Heart sounds: Normal heart sounds, S1 normal and S2 normal.  Pulmonary:     Breath sounds: No decreased breath sounds, wheezing, rhonchi or rales.  Abdominal:     Palpations: Abdomen is soft.     Tenderness: There is no abdominal tenderness.  Musculoskeletal:     Left lower leg: Swelling present.     Right Lower Extremity: Right leg is amputated above knee.  Skin:    General: Skin is warm.     Comments: Gangrene left foot  Neurological:     Mental Status: He is alert.     Comments: This morning answering questions appropriately.  This afternoon more aggressive with his verbalization towards staff and me.     Data Reviewed: No new data today  Family Communication: Left message for daughter  Disposition: Status is: Inpatient Remains inpatient appropriate because: With  the patient refusing amputation at this time will restart Eliquis.  We will see if the patient allows dialysis before making any disposition back to his facility.  Planned Discharge Destination: Long-term care    Time spent: 28 minutes Case discussed with vascular surgery follow-up, TOC  Author: Loletha Grayer, MD 06/05/2022 1:28 PM  For on call review [email or web address].

## 2022-06-05 NOTE — Progress Notes (Signed)
[facility]  ROUNDING NOTE   Subjective:   Ernest [name] is a 76 year old African-American male with past medical conditions including CAD, COPD, CHF, diabetes, stroke with late seizures, hypertension, and end-stage renal disease on hemodialysis.  Patient presents to the emergency department from [facility] with concerns of left foot wound and has been admitted for Dry gangrene [facility]) [I96]  Patient is known to our practice from previous admissions and receives outpatient dialysis treatments at [facility] on a MWF schedule, supervised by [facility] physicians.    Patient seen sitting up in bed Alert and oriented Watching TV Reports sacral pain   Objective:  Vital signs in last 24 hours:  Temp:  [97.6 F (36.4 C)-99 F (37.2 C)] 98 F (36.7 C) (08/22 0829) Pulse Rate:  [102-109] 103 (08/22 0829) Resp:  [15-17] 16 (08/22 0829) BP: (111-126)/(61-78) 113/64 (08/22 0829) SpO2:  [95 %-99 %] 98 % (08/22 0829)  Weight change:  Filed Weights   06/02/22 1516 06/02/22 2026  Weight: 66 kg 57.9 kg    Intake/Output: I/O last 3 completed shifts: In: 456.7 [P.O.:240; IV Piggyback:216.7] Out: -    Intake/Output this shift:  Total I/O In: 240 [P.O.:240] Out: -   Physical Exam: General: NAD  Head: Normocephalic, atraumatic. Moist oral mucosal membranes  Eyes: Anicteric  Lungs:  Clear to auscultation, normal effort, room air  Heart: Regular rate and rhythm  Abdomen:  Soft, nontender, nondistended  Extremities: No peripheral edema.  Neurologic: Nonfocal, moving all four extremities  Skin: necrotic lesions lateral left foot  Access: Right thigh PermCath    Basic Metabolic Panel: Recent Labs  Lab 06/02/22 1526 06/03/22 0534 06/04/22 0540  NA 134* 135 135  K 2.6* 3.1* 3.2*  CL 97* 98 98  CO2 28 28 28   GLUCOSE 170* 80 112*  BUN 26* 32* 39*  CREATININE 2.94* 3.46* 4.45*  CALCIUM 7.4* 7.9* 8.3*  PHOS  --   --  2.8     Liver Function Tests: Recent  Labs  Lab 06/02/22 1526 06/04/22 0540  AST 16  --   ALT 9  --   ALKPHOS 85  --   BILITOT 0.9  --   PROT 6.2*  --   ALBUMIN 2.2* 2.3*    No results for input(s): "LIPASE", "AMYLASE" in the last 168 hours. No results for input(s): "AMMONIA" in the last 168 hours.  CBC: Recent Labs  Lab 06/02/22 1526 06/03/22 0534 06/04/22 0540  WBC 8.3 8.0 8.8  NEUTROABS 6.2  --   --   HGB 11.6* 11.4* 11.1*  HCT 37.3* 36.9* 35.5*  MCV 80.4 79.5* 79.2*  PLT 269 251 306     Cardiac Enzymes: No results for input(s): "CKTOTAL", "CKMB", "CKMBINDEX", "TROPONINI" in the last 168 hours.  BNP: Invalid input(s): "POCBNP"  CBG: Recent Labs  Lab 06/03/22 1006 06/03/22 1133 06/03/22 1644 06/03/22 2052 06/04/22 2209  GLUCAP 122* 125* 153* 157* 148*     Microbiology: Results for orders placed or performed during the hospital encounter of 06/02/22  Blood culture (routine x 2)     Status: None (Preliminary result)   Collection Time: 06/02/22  4:24 PM   Specimen: BLOOD  Result Value Ref Range Status   Specimen Description BLOOD LEFT ANTECUBITAL  Final   Special Requests   Final    BOTTLES DRAWN AEROBIC AND ANAEROBIC Blood Culture results may not be optimal due to an inadequate volume of blood received in culture bottles   Culture  Final    NO GROWTH 2 DAYS Performed at [facility], [address] [place], [place] [address]    Report Status PENDING  Incomplete  Blood culture (routine x 2)     Status: None (Preliminary result)   Collection Time: 06/02/22  8:45 PM   Specimen: BLOOD LEFT HAND  Result Value Ref Range Status   Specimen Description BLOOD LEFT HAND  Final   Special Requests   Final    BOTTLES DRAWN AEROBIC AND ANAEROBIC Blood Culture adequate volume   Culture   Final    NO GROWTH 2 DAYS Performed at [facility], [address] [place], [place] [address]    Report Status PENDING  Incomplete    Coagulation Studies: No results for input(s):  "LABPROT", "INR" in the last 72 hours.  Urinalysis: No results for input(s): "COLORURINE", "LABSPEC", "PHURINE", "GLUCOSEU", "HGBUR", "BILIRUBINUR", "KETONESUR", "PROTEINUR", "UROBILINOGEN", "NITRITE", "LEUKOCYTESUR" in the last 72 hours.  Invalid input(s): "APPERANCEUR"    Imaging: No results found.   Medications:    ampicillin-sulbactam (UNASYN) IV 3 g (06/04/22 1952)   anticoagulant sodium citrate     levETIRAcetam Stopped (06/05/22 1010)   levETIRAcetam 500 mg (06/04/22 2214)   [START ON 06/06/2022] vancomycin      (feeding supplement) PROSource Plus  30 mL Oral TID BM   apixaban  2.5 mg Oral BID   vitamin C  500 mg Oral BID   atorvastatin  40 mg Oral Daily   Chlorhexidine Gluconate Cloth  6 each Topical Daily   DULoxetine  40 mg Oral BID   feeding supplement (NEPRO CARB STEADY)  237 mL Oral TID BM   multivitamin  1 tablet Oral QHS   pantoprazole  40 mg Oral Daily   senna-docusate  1 tablet Oral QHS   sevelamer carbonate  800 mg Oral TID WC   zinc sulfate  220 mg Oral Daily   acetaminophen, alteplase, anticoagulant sodium citrate, haloperidol lactate, heparin, lidocaine (PF), lidocaine-prilocaine, ondansetron **OR** ondansetron (ZOFRAN) IV, pentafluoroprop-tetrafluoroeth  Assessment/ Plan:  Ernest [name] is a 76 y.o.  male  with past medical conditions including CAD, COPD, CHF, diabetes, stroke with late seizures, hypertension, and end-stage renal disease on hemodialysis.  Patient [facility] to the emergency department [facility] with concerns of left foot wound and has been admitted for 06/02/2022  3:25 PM   End-stage renal disease on hemodialysis.   Next treatment scheduled for Wednesday.  2. Anemia of chronic kidney disease Lab Results  Component Value Date   HGB 11.1 (L) 06/04/2022    Hemoglobin within desired goal.  Patient receives [place] outpatient  3. Secondary Hyperparathyroidism: with outpatient labs: PTH 534, phosphorus 4.5, calcium 8.0 on  05/21/2022.   Lab Results  Component Value Date   PTH 63 10/05/2017   PTH Comment 10/05/2017   CALCIUM 8.3 (L) 06/04/2022   CAION 1.05 (L) 07/12/2020   PHOS 2.8 06/04/2022    Continue sevelamer with meals.  4.  Hypertension with chronic kidney disease.  Home regimen includes amlodipine. Receiving during this admission.   5.  Dry gangrene, suspected on left foot.  Vascular surgery consulted and suspect a AKA may be needed. Patient refusing surgery. Primary team to restart Eliquis.    LOS: 3 [place] 8/22/20232:40 PM

## 2022-06-05 NOTE — Assessment & Plan Note (Addendum)
Complicates overall prognosis. 

## 2022-06-05 NOTE — Consult Note (Signed)
Consultation Note Date: 06/05/2022   Patient Name: Ernest [name]  DOB: 10/07/1946  MRN: [id number]  Age / Sex: 76 y.o., male  PCP: Brayton Mars, MD Referring Physician: Loletha Grayer, MD  Reason for Consultation: Establishing goals of care  HPI/Patient Profile: 76 y.o. male  with past medical history of CHF, COPD, CAD, a flutter ([place]), ESRD (MWF), type 2 diabetes, HLD, HTN, vascular dementia, hyperparathyroidism, chronic HFrEF, right AKA, and history of stroke with late seizures (Keppra) admitted on 06/02/2022 with dry gangrene of left foot.   Patient refused HD yesterday, 8/21.  Palliative medicine team was consulted to discuss goals of care.  Psych has also been consulted due to aggressive behavior with nursing staff.  Clinical Assessment and Goals of Care: I have reviewed medical records including EPIC notes, labs and imaging, assessed the patient and then met with patient at bedside to discuss diagnosis prognosis, GOC, EOL wishes, disposition and options. Pt was minimally interactive. He was alert to self, place, and situation, but when asked his thoughts and opinions of HD, surgical procedures, and other medical interventions, patient did not respond. He stared and smiled at me.   Pt did confirm I could speak about his health with his daughter Ernest [name]. I spoke with Ernest [name] over the phone. I introduced Palliative Medicine as specialized medical care for people living with serious illness. It focuses on providing relief from the symptoms and stress of a serious illness. The goal is to improve quality of life for both the patient and the family.  We discussed a brief life review of the patient. Pt was married for 54 years and lost his wife/Ernest [name]'s mother a few months ago. Ernest [name] shares he has been in a "great depression" since his wife passed. Ernest [name] has noticed a significant change in his  mood and affect since her passing.   We discussed patient's current illness and what it means in the larger context of patient's on-going co-morbidities. I attempted to elicit values and goals of care important to the patient.  Advance directives, concepts specific to code status, artificial feeding and hydration, and rehospitalization were considered and discussed.   I reviewed [place] documentation and MOST form on file which reflect DNR, comfort measures, no antibiotics, and no artificial hydration/feeding tube. Ernest [name] is in agreement that patient would not want resuscitative measures in the event that his heart and his lungs stop. She shares that "when he was in his right mind he said he would not want people beating on him and trying to revive him". Ernest [name] in agreement to honor patient's previously made wishes and to code status change to DNR.    Discussed with Ernest [name] the importance of continued conversation with patient and the medical providers regarding overall plan of care and treatment options, ensuring decisions are within the context of the patient's values and GOCs. Ernest [name] is hopeful patient will agree to dialysis and amputation. Time for outcomes needed. Awaiting psyc eval as well.    Questions and concerns were addressed. Ernest [name] was encouraged to call  with questions or concerns. PMT will continue to follow the patient throughout his hospitalization.   Primary Decision Maker NEXT OF KIN  Code Status/Advance Care Planning: DNR  Prognosis:   Unable to determine  Discharge Planning: To Be Determined  Primary Diagnoses: Present on Admission:  Dry gangrene ([place])  Anemia of chronic disease  Atherosclerosis of native arteries of the extremities with gangrene (HCC)  Atrial flutter (HCC)  Chronic HFrEF (heart failure with reduced ejection fraction) (HCC)  Essential hypertension  Pressure injury of skin  Secondary hyperparathyroidism of renal origin [facility])  Vascular dementia  [facility])   Physical Exam Vitals reviewed.  Constitutional:      General: He is not in acute distress. HENT:     Head: Normocephalic.     Mouth/Throat:     Comments: Missing teeth Pulmonary:     Effort: Pulmonary effort is normal.  Abdominal:     Palpations: Abdomen is soft.  Musculoskeletal:     Comments: R AKA, Gangrene of LLE - mild weeping, mild odor  Neurological:     Mental Status: He is alert.     Comments: Oriented to self, situation     Palliative Assessment/Data: 40%     Thank you for this consult. Palliative medicine will continue to follow and assist holistically.   Time Total: 75 minutes Greater than 50%  of this time was spent counseling and coordinating care related to the above assessment and plan.  Signed by: Jordan Hawks, DNP, FNP-BC Palliative Medicine    Please contact Palliative Medicine Team phone at [phone] for questions and concerns.  For individual provider: See Shea Evans

## 2022-06-06 ENCOUNTER — Encounter
Admission: EM | Disposition: A | Discharge status: Hospice | Payer: Self-pay | Source: Skilled Nursing Facility | Attending: Internal Medicine

## 2022-06-06 DIAGNOSIS — N186 End stage renal disease: Secondary | ICD-10-CM | POA: Diagnosis not present

## 2022-06-06 DIAGNOSIS — Z66 Do not resuscitate: Secondary | ICD-10-CM

## 2022-06-06 DIAGNOSIS — Z515 Encounter for palliative care: Secondary | ICD-10-CM

## 2022-06-06 DIAGNOSIS — Z8673 Personal history of transient ischemic attack (TIA), and cerebral infarction without residual deficits: Secondary | ICD-10-CM

## 2022-06-06 DIAGNOSIS — Z89611 Acquired absence of right leg above knee: Secondary | ICD-10-CM

## 2022-06-06 DIAGNOSIS — Z992 Dependence on renal dialysis: Secondary | ICD-10-CM | POA: Diagnosis not present

## 2022-06-06 DIAGNOSIS — I96 Gangrene, not elsewhere classified: Secondary | ICD-10-CM | POA: Diagnosis not present

## 2022-06-06 DIAGNOSIS — F01518 Vascular dementia, unspecified severity, with other behavioral disturbance: Secondary | ICD-10-CM | POA: Diagnosis not present

## 2022-06-06 LAB — GLUCOSE, CAPILLARY: Glucose-Capillary: 113 mg/dL — ABNORMAL HIGH (ref 70–99)

## 2022-06-06 SURGERY — AMPUTATION, ABOVE KNEE
Anesthesia: Choice | Site: Knee | Laterality: Left

## 2022-06-06 MED ORDER — MIDODRINE HCL 5 MG PO TABS: 5.0000 mg | ORAL_TABLET | Freq: Three times a day (TID) | ORAL | Status: DC

## 2022-06-06 MED ORDER — MIDODRINE HCL 5 MG PO TABS
5.0000 mg | ORAL_TABLET | Freq: Three times a day (TID) | ORAL | Status: DC
  Administered 2022-06-06 – 2022-06-12 (×14): 5 mg via ORAL
  Filled 2022-06-06 (×14): qty 1

## 2022-06-06 MED ORDER — HEPARIN SODIUM (PORCINE) 1000 UNIT/ML IJ SOLN
INTRAMUSCULAR | Status: AC
  Filled 2022-06-06: qty 10

## 2022-06-06 NOTE — Progress Notes (Signed)
  Progress Note   Patient: Ernest [name] KGS:[id number] DOB: June 25, 1946 DOA: 06/02/2022     4 DOS: the patient was seen and examined on 06/06/2022   Brief hospital course: 76 year old man with chronic systolic congestive heart failure, COPD, end-stage renal disease, hepatitis C, hypertension, stroke.  Patient brought in by family secondary to gangrene left foot.  Started empirically on antibiotics.  On 06/04/2022 patient refused dialysis and refused medications.  Patient declined amputation at this time.  Appreciate palliative care consultation and patient was made a DO NOT RESUSCITATE.  8/23.  Patient was agreeable and dialyzed today.  Assessment and Plan: Dry gangrene of the left foot. Peripheral vascular disease. Right AKA. Patient followed by vascular surgery, no decision has been made about amputation.  Patient does not appear to have capacity to make a decision, palliative care is reaching out to family, but no answer. Continue to follow.  End-stage renal disease on dialysis Secondary hyperparathyroidism. Patient was dialyzed today.  Vascular dementia. History of stroke. 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 Continue to follow.  Chronic systolic congestive heart failure. No exacerbation.  Stage II gluteal cleft pressure ulcer. Continue to follow.       Subjective:  He is quite confused, but no agitation.  No shortness of breath.  Physical Exam: Vitals:   06/06/22 1206 06/06/22 1215 06/06/22 1230 06/06/22 1419  BP: (!) 141/77 103/69 108/74 102/65  Pulse: 94 84 89 98  Resp: 16 14 10 16   Temp: 97.7 F (36.5 C)   98 F (36.7 C)  TempSrc: Oral     SpO2: 100% 99% 99% 97%  Weight:   55.9 kg   Height:       General exam: Appears calm and comfortable, appears significant malnourished. Respiratory system: Clear to auscultation. Respiratory effort normal. Cardiovascular system: S1 & S2 heard, RRR. No JVD, murmurs, rubs, gallops or clicks. No pedal  edema. Gastrointestinal system: Abdomen is nondistended, soft and nontender. No organomegaly or masses felt. Normal bowel sounds heard. Central nervous system: Alert and oriented. No focal neurological deficits. Extremities: Right AKA, Lrft foot dry gangrene Skin: No rashes, lesions or ulcers    Data Reviewed:  Lab results reviewed.  Family Communication: Not able to reach family.  Disposition: Status is: Inpatient Remains inpatient appropriate because: Severity of disease  Planned Discharge Destination:  pending    Time spent: 35 minutes  Author: Sharen Hones, MD 06/06/2022 2:27 PM  For on call review [email or web address].

## 2022-06-06 NOTE — Progress Notes (Signed)
                                                     Palliative Care Progress Note   Patient Name: Ernest [name]       Date: 06/06/2022 DOB: August 12, 1946  Age: 76 y.o. MRN#: [id number] Attending Physician: Sharen Hones, MD Primary Care Physician: Brayton Mars, MD Admit Date: 06/02/2022  After reviewing the patient's chart and assessing the patient at bedside, I was unable to have meaningful GOC or medical update/discussions with patient. He shared he was in [place], had just bought a house, and was ready to go to dialysis - even though he had just returned from HD. Pt was able to acknowledge my presence but is not fully oriented or able to engage in complex decision making.  No family at bedside.  Attempted to speak with daughter. No answer and unable to leave a VM.   Thank you for allowing the Palliative Medicine Team to assist in the care of Ernest [name]. I will continue to follow him and attempt further [place] discussions.   [place] Ilsa Iha, FNP-BC Palliative Medicine Team Team Phone # [phone]  NO CHARGE

## 2022-06-06 NOTE — Progress Notes (Signed)
Hemodialysis Note  Received patient in bed to unit. Alert and oriented. Informed consent signed and in chart.   Treatment initiated:0828 Treatment completed:1206  Patient tolerated treatment well. Transported back to the room alert, without acute distress. Report given to patient's RN.  Access used: Right Femoral Catheter  Access issues: None  Total UF removed:1 liter  Medications given: Vancomycin  Post HD VS:141/77-104-16  Post HD weight: 55.9kg  Forrest Moron, RN [facility]

## 2022-06-06 NOTE — Progress Notes (Signed)
Pt episodes of sinus tachycardia. Gwyneth Revels, NP and Candiss Norse, MD notified. Pt alert, no c/o, other vss. Safety maintained.

## 2022-06-06 NOTE — Progress Notes (Signed)
[facility]  ROUNDING NOTE   Subjective:   Ernest [name] is a 76 year old African-American male with past medical conditions including CAD, COPD, CHF, diabetes, stroke with late seizures, hypertension, and end-stage renal disease on hemodialysis.  Patient presents to the emergency department from [facility] with concerns of left foot wound and has been admitted for Dry gangrene [facility]) [I96]  Patient is known to our practice from previous admissions and receives outpatient dialysis treatments at [facility] on a MWF schedule, supervised by [facility] physicians.    Patient seen and evaluated during dialysis   HEMODIALYSIS FLOWSHEET:  Blood Flow Rate (mL/min): 400 mL/min Arterial Pressure (mmHg): -260 mmHg Venous Pressure (mmHg): 240 mmHg TMP (mmHg): -4 mmHg Ultrafiltration Rate (mL/min): 543 mL/min Dialysate Flow Rate (mL/min): 300 ml/min Dialysis Fluid Bolus: Normal Saline Bolus Amount (mL): 300 mL   Patient appears to be resting comfortably during treatment No complaints at this time   Objective:  Vital signs in last 24 hours:  Temp:  [97.2 F (36.2 C)-98.4 F (36.9 C)] 97.2 F (36.2 C) (08/23 0809) Pulse Rate:  [25-122] 122 (08/23 1130) Resp:  [11-20] 11 (08/23 1130) BP: (93-123)/(61-101) 116/69 (08/23 1130) SpO2:  [93 %-99 %] 99 % (08/23 1130) Weight:  [56.9 kg] 56.9 kg (08/23 0809)  Weight change:  Filed Weights   06/02/22 1516 06/02/22 2026 06/06/22 0809  Weight: 66 kg 57.9 kg 56.9 kg    Intake/Output: I/O last 3 completed shifts: In: 540 [P.O.:240; IV Piggyback:300] Out: -    Intake/Output this shift:  No intake/output data recorded.  Physical Exam: General: NAD  Head: Normocephalic, atraumatic. Moist oral mucosal membranes  Eyes: Anicteric  Lungs:  Clear to auscultation, normal effort, room air  Heart: Regular rate and rhythm  Abdomen:  Soft, nontender, nondistended  Extremities: No peripheral edema.  Neurologic: Nonfocal,  moving all four extremities  Skin: necrotic lesions left foot  Access: Right thigh PermCath    Basic Metabolic Panel: Recent Labs  Lab 06/02/22 1526 06/03/22 0534 06/04/22 0540  NA 134* 135 135  K 2.6* 3.1* 3.2*  CL 97* 98 98  CO2 28 28 28   GLUCOSE 170* 80 112*  BUN 26* 32* 39*  CREATININE 2.94* 3.46* 4.45*  CALCIUM 7.4* 7.9* 8.3*  PHOS  --   --  2.8     Liver Function Tests: Recent Labs  Lab 06/02/22 1526 06/04/22 0540  AST 16  --   ALT 9  --   ALKPHOS 85  --   BILITOT 0.9  --   PROT 6.2*  --   ALBUMIN 2.2* 2.3*    No results for input(s): "LIPASE", "AMYLASE" in the last 168 hours. No results for input(s): "AMMONIA" in the last 168 hours.  CBC: Recent Labs  Lab 06/02/22 1526 06/03/22 0534 06/04/22 0540  WBC 8.3 8.0 8.8  NEUTROABS 6.2  --   --   HGB 11.6* 11.4* 11.1*  HCT 37.3* 36.9* 35.5*  MCV 80.4 79.5* 79.2*  PLT 269 251 306     Cardiac Enzymes: No results for input(s): "CKTOTAL", "CKMB", "CKMBINDEX", "TROPONINI" in the last 168 hours.  BNP: Invalid input(s): "POCBNP"  CBG: Recent Labs  Lab 06/03/22 1006 06/03/22 1133 06/03/22 1644 06/03/22 2052 06/04/22 2209  GLUCAP 122* 125* 153* 157* 148*     Microbiology: Results for orders placed or performed during the hospital encounter of 06/02/22  Blood culture (routine x 2)     Status: None (Preliminary result)   Collection Time: 06/02/22  4:24 PM   Specimen: BLOOD  Result Value Ref Range Status   Specimen Description BLOOD LEFT ANTECUBITAL  Final   Special Requests   Final    BOTTLES DRAWN AEROBIC AND ANAEROBIC Blood Culture results may not be optimal due to an inadequate volume of blood received in culture bottles   Culture   Final    NO GROWTH 4 DAYS Performed at [facility], [address], [place], [place] [address]    Report Status PENDING  Incomplete  Blood culture (routine x 2)     Status: None (Preliminary result)   Collection Time: 06/02/22  8:45 PM   Specimen:  BLOOD LEFT HAND  Result Value Ref Range Status   Specimen Description BLOOD LEFT HAND  Final   Special Requests   Final    BOTTLES DRAWN AEROBIC AND ANAEROBIC Blood Culture adequate volume   Culture   Final    NO GROWTH 4 DAYS Performed at [facility], [address], [place], [place] [address]    Report Status PENDING  Incomplete    Coagulation Studies: No results for input(s): "LABPROT", "INR" in the last 72 hours.  Urinalysis: No results for input(s): "COLORURINE", "LABSPEC", "PHURINE", "GLUCOSEU", "HGBUR", "BILIRUBINUR", "KETONESUR", "PROTEINUR", "UROBILINOGEN", "NITRITE", "LEUKOCYTESUR" in the last 72 hours.  Invalid input(s): "APPERANCEUR"    Imaging: No results found.   Medications:    ampicillin-sulbactam (UNASYN) IV Stopped (06/05/22 1838)   anticoagulant sodium citrate     levETIRAcetam 500 mg (06/05/22 2132)   levETIRAcetam Stopped (06/04/22 2229)   vancomycin 500 mg (06/06/22 1036)    (feeding supplement) PROSource Plus  30 mL Oral TID BM   apixaban  2.5 mg Oral BID   vitamin C  500 mg Oral BID   atorvastatin  40 mg Oral Daily   Chlorhexidine Gluconate Cloth  6 each Topical Daily   DULoxetine  40 mg Oral BID   feeding supplement (NEPRO CARB STEADY)  237 mL Oral TID BM   multivitamin  1 tablet Oral QHS   pantoprazole  40 mg Oral Daily   senna-docusate  1 tablet Oral QHS   sevelamer carbonate  800 mg Oral TID WC   zinc sulfate  220 mg Oral Daily   acetaminophen, alteplase, anticoagulant sodium citrate, haloperidol lactate, heparin, lidocaine (PF), lidocaine-prilocaine, ondansetron **OR** ondansetron (ZOFRAN) IV, pentafluoroprop-tetrafluoroeth  Assessment/ Plan:  [name] is a 76 y.o.  male  with past medical conditions including CAD, COPD, CHF, diabetes, stroke with late seizures, hypertension, and end-stage renal disease on hemodialysis.  Patient presents to the emergency department from [facility] with concerns of left foot wound  and has been admitted for 06/02/2022  3:25 PM   [facility]/MWF/right thigh PermCath  End-stage renal disease on hemodialysis.   Receiving scheduled treatment today, UF goal 1 L as tolerated.  Next treatment scheduled for Friday.  2. Anemia of chronic kidney disease Lab Results  Component Value Date   HGB 11.1 (L) 06/04/2022    Patient receives [place] outpatient.  Hemoglobin within acceptable range at this time.  3. Secondary Hyperparathyroidism: with outpatient labs: PTH 534, phosphorus 4.5, calcium 8.0 on 05/21/2022.   Lab Results  Component Value Date   PTH 63 10/05/2017   PTH Comment 10/05/2017   CALCIUM 8.3 (L) 06/04/2022   CAION 1.05 (L) 07/12/2020   PHOS 2.8 06/04/2022  Calcium and phosphorus within desired range. Continue sevelamer with meals.  4.  Hypertension with chronic kidney disease.  Home  regimen includes amlodipine. Receiving during this admission.  Blood pressure currently 116/69 during dialysis.  5.  Dry gangrene, suspected on left foot.  Vascular surgery consulted and suspect a AKA may be needed. Patient refusing surgery. Primary team to restart Eliquis.    LOS: 4   8/23/202311:34 AM

## 2022-06-07 ENCOUNTER — Encounter: Payer: Self-pay | Admitting: Internal Medicine

## 2022-06-07 DIAGNOSIS — F4329 Adjustment disorder with other symptoms: Secondary | ICD-10-CM | POA: Diagnosis not present

## 2022-06-07 DIAGNOSIS — N186 End stage renal disease: Secondary | ICD-10-CM | POA: Diagnosis not present

## 2022-06-07 DIAGNOSIS — F01518 Vascular dementia, unspecified severity, with other behavioral disturbance: Secondary | ICD-10-CM | POA: Diagnosis not present

## 2022-06-07 DIAGNOSIS — I96 Gangrene, not elsewhere classified: Secondary | ICD-10-CM | POA: Diagnosis not present

## 2022-06-07 LAB — CULTURE, BLOOD (ROUTINE X 2)
Culture: NO GROWTH
Culture: NO GROWTH
Special Requests: ADEQUATE

## 2022-06-07 MED ORDER — POLYETHYLENE GLYCOL 3350 17 G PO PACK
17.0000 g | PACK | Freq: Every day | ORAL | Status: DC
  Administered 2022-06-07 – 2022-06-16 (×8): 17 g via ORAL
  Filled 2022-06-07 (×7): qty 1

## 2022-06-07 NOTE — Progress Notes (Signed)
Pharmacy Antibiotic Note  Ernest [name] is a 76 y.o. male admitted on 06/02/2022 with  wound/ gangrene foot infection .  Pharmacy has been consulted for Vancmycin and Unasyn dosing.  -Hemodialysis pt  Plan: -Vancomycin 500mg  IV QHD-MWF after dialysis for weight=56g  -Unasyn 3 gm IV q24h after dialysis  Bcx NGx5d, plan AKA    Height: 5\' 9"  (175.3 cm) Weight: 55.9 kg (123 lb 3.8 oz) IBW/kg (Calculated) : 70.7  Temp (24hrs), Avg:98 F (36.7 C), Min:97.7 F (36.5 C), Max:98.6 F (37 C)  Recent Labs  Lab 06/02/22 1526 06/02/22 1625 06/03/22 0534 06/04/22 0540  WBC 8.3  --  8.0 8.8  CREATININE 2.94*  --  3.46* 4.45*  LATICACIDVEN 1.0 1.0  --   --      Estimated Creatinine Clearance: 11.7 mL/min (A) (by C-G formula based on SCr of 4.45 mg/dL (H)).    [name]  Antimicrobials this admission: Vanc 8/21 >> Unasyn 8/21>>  Dose adjustments this admission:    Microbiology results: 8/19 BCx: NG 5d   UCx:      Sputum:      MRSA PCR:    Thank you for allowing pharmacy to be a part of this patient's care.  Sharalee Witman A 06/07/2022 10:03 AM

## 2022-06-07 NOTE — Consult Note (Signed)
After three attempted visits since consult 8/21, patient allowed for me to look at sacrum today. Very pleasant with [facility] nurse, reports pain. Stage 2 Pressure injury present  WOC Nurse Consult Note: Reason for Consult: pressure injury Wound type: Stage 2 Pressure Injury Pressure Injury POA: Yes Measurement:1.0cm x 0.5cm x 0.1cm  Wound bed: 100% pink, clean  Drainage (amount, consistency, odor) none Periwound: intact  Dressing procedure/placement/frequency: Continue silicone foam dressing per nursing skin care order set. Implemented appropriately  Discussed POC with patient and bedside nurse.  Re consult if needed, will not follow at this time. Thanks  Santanna Whitford [employer], RN,CWOCN, CNS, [place] [phone])

## 2022-06-07 NOTE — Progress Notes (Signed)
  Progress Note   Patient: Ernest [name] XNT:[id number] DOB: 1946/06/15 DOA: 06/02/2022     5 DOS: the patient was seen and examined on 06/07/2022   Brief hospital course: 76 year old man with chronic systolic congestive heart failure, COPD, end-stage renal disease, hepatitis C, hypertension, stroke.  Patient brought in by family secondary to gangrene left foot.  Started empirically on antibiotics.  On 06/04/2022 patient refused dialysis and refused medications.  Patient declined amputation at this time.  Appreciate palliative care consultation and patient was made a DO NOT RESUSCITATE.  8/23.  Patient was agreeable and dialyzed today.  Assessment and Plan: Dry gangrene of the left foot. Peripheral vascular disease. Right AKA. Had a long discussion with patient POA, daughter, explained patient condition.  She is agreeable for amputation.  Notified vascular surgery. Continue IV antibiotics until amputation is completed   End-stage renal disease on dialysis Secondary hyperparathyroidism. Hypokalemia. Hypotension. Patient has been dialyzed by nephrology.  Blood pressure was low after dialysis, started midodrine.   Vascular dementia. History of stroke. 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 Patient appears to be confused at baseline, he does not seem to have capacity to understand complex medical issues.  POA to make decision for patient.   Chronic systolic congestive heart failure. No exacerbation.   Stage II gluteal cleft pressure ulcer. Continue to follow.  Severe protein calorie malnutrition. Patient is cachectic with significant muscle atrophy.  Continue protein supplements.     Subjective:  Patient blood pressure was low after dialysis yesterday, midodrine was started. Patient has baseline confusion, no agitation.  Physical Exam: Vitals:   06/06/22 1605 06/06/22 2022 06/07/22 0510 06/07/22 0835  BP: (!) 81/24 (!) 119/28 (!) 89/17 (!) 85/58  Pulse: 100 91 95 89  Resp: 17  18 18 15   Temp: 97.8 F (36.6 C) 98.6 F (37 C) 97.9 F (36.6 C) 97.9 F (36.6 C)  TempSrc:      SpO2: 98% 100% 98% 100%  Weight:      Height:       General exam: Appears calm and comfortable, severely malnourished. Respiratory system: Clear to auscultation. Respiratory effort normal. Cardiovascular system: S1 & S2 heard, RRR. No JVD, murmurs, rubs, gallops or clicks. No pedal edema. Gastrointestinal system: Abdomen is nondistended, soft and nontender. No organomegaly or masses felt. Normal bowel sounds heard. Central nervous system: Alert and oriented x2. No focal neurological deficits. Extremities: Significant muscle atrophy, right AKA, left foot gangrene. Skin: No rashes, lesions or ulcers Psychiatry: Flat affect.  Data Reviewed:  Lab results reviewed.  Family Communication: Long discussion with patient POA, daughter, also discussed with patient brother.  Disposition: Status is: Inpatient Remains inpatient appropriate because: Severity of disease, IV treatment as well as inpatient procedure.  Planned Discharge Destination: Skilled nursing facility    Time spent: 60 minutes  Author: Sharen Hones, MD 06/07/2022 10:27 AM  For on call review [email or web address].

## 2022-06-07 NOTE — Progress Notes (Signed)
Hemodialysis patient known at [facility] MWF 6:40am. Met with patient, patient was able to confirm dialysis days and clinic, however was not able to confirm anything else. Patient appeared to be pleasantly confused. I will continue to follow patient for dialysis discharge planning.  

## 2022-06-07 NOTE — Progress Notes (Signed)
Nutrition Follow-up  DOCUMENTATION CODES:   Severe malnutrition in context of chronic illness  INTERVENTION:   -Continue renal MVI daily -Continue Nepro Shake po TID, each supplement provides 425 kcal and 19 grams protein  -Continue 30 ml Prosource Plus TID, each supplement provides 100 kcals and 15 grams protein -Continue 500 mg vitamin C BID -Continue 220 mg zinc sulfate x 14 days  NUTRITION DIAGNOSIS:   Severe Malnutrition related to chronic illness (COPD) as evidenced by severe fat depletion, severe muscle depletion.  Ongoing  GOAL:   Patient will meet greater than or equal to 90% of their needs  Progressing   MONITOR:   PO intake, Supplement acceptance  REASON FOR ASSESSMENT:   Malnutrition Screening Tool    ASSESSMENT:   Pt with medical history significant of CHF, COPD, CAD, Aflutter on eliquis, ESRD on HD MWF, DM2, h/o stroke with late seizure on keppra, HLD, HTN who presents for dry gangrene of the left foot.  Reviewed I/O's: +299 ml x 24 hours and +1.4 L since admission   Pt intermittently aggitated and refusing care, but this has improved over the past few days. Pt with erratic intake, noted meal completions 0-85%. Pt is taking supplements.   Palliative care following for goals of care; pt is refusing amputation. They are reaching out to family to better determine goals of care.   Reviewed wts; pt has experienced 15.3% wt loss over the past 4 days.   Medications reviewed and include senna-docusate, renvela, vitamin C, zinc sulfate, and keppra.   Labs reviewed: K: 3.2, CBGS: 113-157 (inpatient orders for glycemic control are none).    Diet Order:   Diet Order             Diet renal with fluid restriction Fluid restriction: 1200 mL Fluid; Room service appropriate? Yes; Fluid consistency: Thin  Diet effective now                   EDUCATION NEEDS:   Not appropriate for education at this time  Skin:  Skin Assessment: Reviewed RN  Assessment  Last BM:  06/04/22  Height:   Ht Readings from Last 1 Encounters:  06/02/22 5\' 9"  (1.753 m)    Weight:   Wt Readings from Last 1 Encounters:  06/06/22 55.9 kg    Ideal Body Weight:  66.9 kg (adjusted for rt AKA)  BMI:  Body mass index is 18.2 kg/m.  Estimated Nutritional Needs:   Kcal:  7035-0093  Protein:  130-145 grams  Fluid:  1000 ml + UOP    Loistine Chance, RD, LDN, [place] Registered Dietitian II Certified Diabetes Care and Education Specialist Please refer to [facility] for RD and/or RD on-call/weekend/after hours pager

## 2022-06-07 NOTE — Progress Notes (Signed)
[facility]  ROUNDING NOTE   Subjective:   Ernest [name] is a 76 year old African-American male with past medical conditions including CAD, COPD, CHF, diabetes, stroke with late seizures, hypertension, and end-stage renal disease on hemodialysis.  Patient presents to the emergency department from [facility] with concerns of left foot wound and has been admitted for Dry gangrene [facility]) [I96]  Patient is known to our practice from previous admissions and receives outpatient dialysis treatments at [facility] on a MWF schedule, supervised by [facility] physicians.    Patient seen resting quietly in bed Appropriate appetite, denies nausea and vomiting Continues to complain of pain in sacral area Remains on room air, denies shortness of breath   Objective:  Vital signs in last 24 hours:  Temp:  [97.7 F (36.5 C)-98.6 F (37 C)] 97.9 F (36.6 C) (08/24 0835) Pulse Rate:  [84-103] 89 (08/24 0835) Resp:  [10-18] 15 (08/24 0835) BP: (81-141)/(17-77) 85/58 (08/24 0835) SpO2:  [97 %-100 %] 100 % (08/24 0835) Weight:  [55.9 kg] 55.9 kg (08/23 1230)  Weight change:  Filed Weights   06/02/22 2026 06/06/22 0809 06/06/22 1230  Weight: 57.9 kg 56.9 kg 55.9 kg    Intake/Output: I/O last 3 completed shifts: In: 400 [IV Piggyback:400] Out: 1 [Other:1]   Intake/Output this shift:  No intake/output data recorded.  Physical Exam: General: NAD  Head: Normocephalic, atraumatic. Moist oral mucosal membranes  Eyes: Anicteric  Lungs:  Clear to auscultation, normal effort, room air  Heart: Regular rate and rhythm  Abdomen:  Soft, nontender, nondistended  Extremities: No peripheral edema.  Neurologic: Nonfocal, moving all four extremities  Skin: necrotic lesions left foot  Access: Right thigh PermCath    Basic Metabolic Panel: Recent Labs  Lab 06/02/22 1526 06/03/22 0534 06/04/22 0540  NA 134* 135 135  K 2.6* 3.1* 3.2*  CL 97* 98 98  CO2 28 28 28   GLUCOSE 170*  80 112*  BUN 26* 32* 39*  CREATININE 2.94* 3.46* 4.45*  CALCIUM 7.4* 7.9* 8.3*  PHOS  --   --  2.8     Liver Function Tests: Recent Labs  Lab 06/02/22 1526 06/04/22 0540  AST 16  --   ALT 9  --   ALKPHOS 85  --   BILITOT 0.9  --   PROT 6.2*  --   ALBUMIN 2.2* 2.3*    No results for input(s): "LIPASE", "AMYLASE" in the last 168 hours. No results for input(s): "AMMONIA" in the last 168 hours.  CBC: Recent Labs  Lab 06/02/22 1526 06/03/22 0534 06/04/22 0540  WBC 8.3 8.0 8.8  NEUTROABS 6.2  --   --   HGB 11.6* 11.4* 11.1*  HCT 37.3* 36.9* 35.5*  MCV 80.4 79.5* 79.2*  PLT 269 251 306     Cardiac Enzymes: No results for input(s): "CKTOTAL", "CKMB", "CKMBINDEX", "TROPONINI" in the last 168 hours.  BNP: Invalid input(s): "POCBNP"  CBG: Recent Labs  Lab 06/03/22 1133 06/03/22 1644 06/03/22 2052 06/04/22 2209 06/06/22 1423  GLUCAP 125* 153* 157* 148* 113*     Microbiology: Results for orders placed or performed during the hospital encounter of 06/02/22  Blood culture (routine x 2)     Status: None   Collection Time: 06/02/22  4:24 PM   Specimen: BLOOD  Result Value Ref Range Status   Specimen Description BLOOD LEFT ANTECUBITAL  Final   Special Requests   Final    BOTTLES DRAWN AEROBIC AND ANAEROBIC Blood Culture results may not  be optimal due to an inadequate volume of blood received in culture bottles   Culture   Final    NO GROWTH 5 DAYS Performed at [facility], [address] [place], [place] [address]    Report Status 06/07/2022 FINAL  Final  Blood culture (routine x 2)     Status: None   Collection Time: 06/02/22  8:45 PM   Specimen: BLOOD LEFT HAND  Result Value Ref Range Status   Specimen Description BLOOD LEFT HAND  Final   Special Requests   Final    BOTTLES DRAWN AEROBIC AND ANAEROBIC Blood Culture adequate volume   Culture   Final    NO GROWTH 5 DAYS Performed at [facility], [address] [place], [place]  [address]    Report Status 06/07/2022 FINAL  Final    Coagulation Studies: No results for input(s): "LABPROT", "INR" in the last 72 hours.  Urinalysis: No results for input(s): "COLORURINE", "LABSPEC", "PHURINE", "GLUCOSEU", "HGBUR", "BILIRUBINUR", "KETONESUR", "PROTEINUR", "UROBILINOGEN", "NITRITE", "LEUKOCYTESUR" in the last 72 hours.  Invalid input(s): "APPERANCEUR"    Imaging: No results found.   Medications:    ampicillin-sulbactam (UNASYN) IV 3 g (06/06/22 1735)   levETIRAcetam 500 mg (06/07/22 1017)   levETIRAcetam 500 mg (06/06/22 2206)   vancomycin Stopped (06/06/22 1405)    (feeding supplement) PROSource Plus  30 mL Oral TID BM   apixaban  2.5 mg Oral BID   vitamin C  500 mg Oral BID   atorvastatin  40 mg Oral Daily   Chlorhexidine Gluconate Cloth  6 each Topical Daily   DULoxetine  40 mg Oral BID   feeding supplement (NEPRO CARB STEADY)  237 mL Oral TID BM   midodrine  5 mg Oral TID WC   multivitamin  1 tablet Oral QHS   pantoprazole  40 mg Oral Daily   polyethylene glycol  17 g Oral Daily   senna-docusate  1 tablet Oral QHS   sevelamer carbonate  800 mg Oral TID WC   zinc sulfate  220 mg Oral Daily   acetaminophen, haloperidol lactate, ondansetron **OR** ondansetron (ZOFRAN) IV  Assessment/ Plan:  [name] is a 76 y.o.  male  with past medical conditions including CAD, COPD, CHF, diabetes, stroke with late seizures, hypertension, and end-stage renal disease on hemodialysis.  Patient presents to the emergency department from [facility] with concerns of left foot wound and has been admitted for 06/02/2022  3:25 PM   [facility]/MWF/right thigh PermCath  End-stage renal disease on hemodialysis.   Completed dialysis yesterday with UF 1 L achieved.[name]  Next treatment scheduled for Friday.  2. Anemia of chronic kidney disease Lab Results  Component Value Date   HGB 11.1 (L) 06/04/2022    Patient receives [place] outpatient.  We will  continue to monitor hemoglobin during this admission.  3. Secondary Hyperparathyroidism: with outpatient labs: PTH 534, phosphorus 4.5, calcium 8.0 on 05/21/2022.   Lab Results  Component Value Date   PTH 63 10/05/2017   PTH Comment 10/05/2017   CALCIUM 8.3 (L) 06/04/2022   CAION 1.05 (L) 07/12/2020   PHOS 2.8 06/04/2022  Bone minerals acceptable at this time. Continue sevelamer with meals.  4.  Hypertension with chronic kidney disease.  Home regimen includes amlodipine. Blood pressure soft this morning, 85/58.  Amlodipine stopped, patient placed on midodrine 5 mg 3 times daily.  5.  Dry gangrene, suspected on left foot.  Vascular surgery consulted and suspect a AKA  may be needed. Patient refusing surgery however POA agreeable.  Eliquis restarted by primary team.    LOS: 5   8/24/202311:57 AM

## 2022-06-08 ENCOUNTER — Inpatient Hospital Stay: Payer: Medicare (Managed Care) | Admitting: Registered Nurse

## 2022-06-08 ENCOUNTER — Encounter
Admission: EM | Disposition: A | Discharge status: Hospice | Payer: Self-pay | Source: Skilled Nursing Facility | Attending: Internal Medicine

## 2022-06-08 ENCOUNTER — Encounter: Payer: Self-pay | Admitting: Internal Medicine

## 2022-06-08 ENCOUNTER — Other Ambulatory Visit: Payer: Self-pay

## 2022-06-08 DIAGNOSIS — F01518 Vascular dementia, unspecified severity, with other behavioral disturbance: Secondary | ICD-10-CM | POA: Diagnosis not present

## 2022-06-08 DIAGNOSIS — N186 End stage renal disease: Secondary | ICD-10-CM | POA: Diagnosis not present

## 2022-06-08 DIAGNOSIS — Z992 Dependence on renal dialysis: Secondary | ICD-10-CM | POA: Diagnosis not present

## 2022-06-08 DIAGNOSIS — I96 Gangrene, not elsewhere classified: Secondary | ICD-10-CM | POA: Diagnosis not present

## 2022-06-08 HISTORY — PX: AMPUTATION: SHX166

## 2022-06-08 LAB — BASIC METABOLIC PANEL
Anion gap: 9 (ref 5–15)
BUN: 42 mg/dL — ABNORMAL HIGH (ref 8–23)
CO2: 28 mmol/L (ref 22–32)
Calcium: 9 mg/dL (ref 8.9–10.3)
Chloride: 99 mmol/L (ref 98–111)
Creatinine, Ser: 4.57 mg/dL — ABNORMAL HIGH (ref 0.61–1.24)
GFR, Estimated: 13 mL/min — ABNORMAL LOW (ref >60)
Glucose, Bld: 135 mg/dL — ABNORMAL HIGH (ref 70–99)
Potassium: 3.4 mmol/L — ABNORMAL LOW (ref 3.5–5.1)
Sodium: 136 mmol/L (ref 135–145)

## 2022-06-08 LAB — TYPE AND SCREEN
ABO/RH(D): B POS
Antibody Screen: NEGATIVE

## 2022-06-08 LAB — CBC
HCT: 36.1 % — ABNORMAL LOW (ref 39.0–52.0)
Hemoglobin: 11.3 g/dL — ABNORMAL LOW (ref 13.0–17.0)
MCH: 24.7 pg — ABNORMAL LOW (ref 26.0–34.0)
MCHC: 31.3 g/dL (ref 30.0–36.0)
MCV: 78.8 fL — ABNORMAL LOW (ref 80.0–100.0)
Platelets: 323 10*3/uL (ref 150–400)
RBC: 4.58 MIL/uL (ref 4.22–5.81)
RDW: 19.7 % — ABNORMAL HIGH (ref 11.5–15.5)
WBC: 9.9 10*3/uL (ref 4.0–10.5)
nRBC: 0 % (ref 0.0–0.2)

## 2022-06-08 LAB — GLUCOSE, CAPILLARY: Glucose-Capillary: 129 mg/dL — ABNORMAL HIGH (ref 70–99)

## 2022-06-08 SURGERY — AMPUTATION, ABOVE KNEE
Anesthesia: General | Site: Knee | Laterality: Left

## 2022-06-08 MED ORDER — OXYCODONE HCL 5 MG/5ML PO SOLN: 5.0000 mg | Freq: Once | ORAL | Status: DC | PRN

## 2022-06-08 MED ORDER — ACETAMINOPHEN 10 MG/ML IV SOLN
INTRAVENOUS | Status: DC | PRN
  Administered 2022-06-08: 1000 mg via INTRAVENOUS

## 2022-06-08 MED ORDER — OXYCODONE HCL 5 MG PO TABS: 5.0000 mg | ORAL_TABLET | Freq: Once | ORAL | Status: DC | PRN

## 2022-06-08 MED ORDER — HYDROMORPHONE HCL 1 MG/ML IJ SOLN
0.5000 mg | INTRAMUSCULAR | Status: DC | PRN
  Administered 2022-06-08 – 2022-06-22 (×13): 0.5 mg via INTRAVENOUS
  Filled 2022-06-08 (×14): qty 1

## 2022-06-08 MED ORDER — CHLORHEXIDINE GLUCONATE CLOTH 2 % EX PADS
6.0000 | MEDICATED_PAD | Freq: Once | CUTANEOUS | Status: AC
  Administered 2022-06-08: 6 via TOPICAL

## 2022-06-08 MED ORDER — CHLORHEXIDINE GLUCONATE CLOTH 2 % EX PADS: 6.0000 | MEDICATED_PAD | Freq: Once | CUTANEOUS | Status: DC

## 2022-06-08 MED ORDER — OXYCODONE-ACETAMINOPHEN 5-325 MG PO TABS
1.0000 | ORAL_TABLET | ORAL | Status: DC | PRN
  Administered 2022-06-09 – 2022-06-22 (×5): 1 via ORAL
  Filled 2022-06-08 (×5): qty 1

## 2022-06-08 MED ORDER — ACETAMINOPHEN 10 MG/ML IV SOLN
INTRAVENOUS | Status: AC
  Filled 2022-06-08: qty 100

## 2022-06-08 MED ORDER — KETAMINE HCL 10 MG/ML IJ SOLN
INTRAMUSCULAR | Status: DC | PRN
  Administered 2022-06-08: 20 mg via INTRAVENOUS

## 2022-06-08 MED ORDER — PHENYLEPHRINE HCL (PRESSORS) 10 MG/ML IV SOLN
INTRAVENOUS | Status: DC | PRN
  Administered 2022-06-08: 80 ug via INTRAVENOUS

## 2022-06-08 MED ORDER — EPHEDRINE SULFATE (PRESSORS) 50 MG/ML IJ SOLN
INTRAMUSCULAR | Status: DC | PRN
  Administered 2022-06-08: 5 mg via INTRAVENOUS

## 2022-06-08 MED ORDER — ONDANSETRON HCL 4 MG/2ML IJ SOLN
4.0000 mg | Freq: Four times a day (QID) | INTRAMUSCULAR | Status: DC | PRN
  Administered 2022-06-08: 4 mg via INTRAVENOUS

## 2022-06-08 MED ORDER — 0.9 % SODIUM CHLORIDE (POUR BTL) OPTIME
TOPICAL | Status: DC | PRN
  Administered 2022-06-08: 1000 mL

## 2022-06-08 MED ORDER — PHENYLEPHRINE HCL-NACL 20-0.9 MG/250ML-% IV SOLN
INTRAVENOUS | Status: DC | PRN
  Administered 2022-06-08: 240 ug via INTRAVENOUS
  Administered 2022-06-08: 160 ug via INTRAVENOUS
  Administered 2022-06-08: 240 ug via INTRAVENOUS

## 2022-06-08 MED ORDER — ONDANSETRON HCL 4 MG/2ML IJ SOLN
INTRAMUSCULAR | Status: DC | PRN
  Administered 2022-06-08: 4 mg via INTRAVENOUS

## 2022-06-08 MED ORDER — EPHEDRINE 5 MG/ML INJ
INTRAVENOUS | Status: AC
  Filled 2022-06-08: qty 5

## 2022-06-08 MED ORDER — DEXAMETHASONE SODIUM PHOSPHATE 10 MG/ML IJ SOLN
INTRAMUSCULAR | Status: DC | PRN
  Administered 2022-06-08: 4 mg via INTRAVENOUS

## 2022-06-08 MED ORDER — FENTANYL CITRATE (PF) 100 MCG/2ML IJ SOLN
INTRAMUSCULAR | Status: AC
  Administered 2022-06-08: 25 ug via INTRAVENOUS
  Filled 2022-06-08: qty 2

## 2022-06-08 MED ORDER — DEXAMETHASONE SODIUM PHOSPHATE 10 MG/ML IJ SOLN
INTRAMUSCULAR | Status: AC
  Filled 2022-06-08: qty 1

## 2022-06-08 MED ORDER — SODIUM CHLORIDE 0.9 % IV SOLN: INTRAVENOUS | Status: DC | PRN

## 2022-06-08 MED ORDER — PHENYLEPHRINE 80 MCG/ML (10ML) SYRINGE FOR IV PUSH (FOR BLOOD PRESSURE SUPPORT)
PREFILLED_SYRINGE | INTRAVENOUS | Status: AC
  Filled 2022-06-08: qty 10

## 2022-06-08 MED ORDER — HYDROMORPHONE HCL 1 MG/ML IJ SOLN: 1.0000 mg | Freq: Once | INTRAMUSCULAR | Status: DC | PRN

## 2022-06-08 MED ORDER — KETAMINE HCL 50 MG/5ML IJ SOSY
PREFILLED_SYRINGE | INTRAMUSCULAR | Status: AC
  Filled 2022-06-08: qty 5

## 2022-06-08 MED ORDER — LIDOCAINE HCL (PF) 2 % IJ SOLN
INTRAMUSCULAR | Status: AC
  Filled 2022-06-08: qty 5

## 2022-06-08 MED ORDER — PROPOFOL 10 MG/ML IV BOLUS
INTRAVENOUS | Status: DC | PRN
  Administered 2022-06-08: 130 mg via INTRAVENOUS

## 2022-06-08 MED ORDER — LIDOCAINE HCL (CARDIAC) PF 100 MG/5ML IV SOSY
PREFILLED_SYRINGE | INTRAVENOUS | Status: DC | PRN
  Administered 2022-06-08: 100 mg via INTRAVENOUS

## 2022-06-08 MED ORDER — FENTANYL CITRATE (PF) 100 MCG/2ML IJ SOLN
25.0000 ug | INTRAMUSCULAR | Status: DC | PRN
  Administered 2022-06-08: 25 ug via INTRAVENOUS

## 2022-06-08 MED ORDER — ONDANSETRON HCL 4 MG/2ML IJ SOLN
INTRAMUSCULAR | Status: AC
  Filled 2022-06-08: qty 2

## 2022-06-08 MED ORDER — FENTANYL CITRATE (PF) 100 MCG/2ML IJ SOLN
INTRAMUSCULAR | Status: AC
  Filled 2022-06-08: qty 2

## 2022-06-08 MED ORDER — FENTANYL CITRATE (PF) 100 MCG/2ML IJ SOLN
INTRAMUSCULAR | Status: DC | PRN
  Administered 2022-06-08 (×2): 25 ug via INTRAVENOUS

## 2022-06-08 SURGICAL SUPPLY — 36 items
BLADE SAGITTAL WIDE XTHICK NO (BLADE) ×1 IMPLANT
BNDG COHESIVE 4X5 TAN STRL LF (GAUZE/BANDAGES/DRESSINGS) ×1 IMPLANT
BNDG ELASTIC 6X5.8 VLCR NS LF (GAUZE/BANDAGES/DRESSINGS) ×1 IMPLANT
BNDG GAUZE DERMACEA FLUFF (GAUZE/BANDAGES/DRESSINGS) ×2
BNDG GAUZE DERMACEA FLUFF 4 (GAUZE/BANDAGES/DRESSINGS) ×2 IMPLANT
BRUSH SCRUB EZ  4% CHG (MISCELLANEOUS) ×1
BRUSH SCRUB EZ 4% CHG (MISCELLANEOUS) ×1 IMPLANT
CHLORAPREP W/TINT 26 (MISCELLANEOUS) ×1 IMPLANT
DRAPE INCISE IOBAN 66X60 STRL (DRAPES) ×1 IMPLANT
ELECT CAUTERY BLADE 6.4 (BLADE) ×1 IMPLANT
ELECT REM PT RETURN 9FT ADLT (ELECTROSURGICAL) ×1
ELECTRODE REM PT RTRN 9FT ADLT (ELECTROSURGICAL) ×1 IMPLANT
GAUZE XEROFORM 1X8 LF (GAUZE/BANDAGES/DRESSINGS) ×2 IMPLANT
GLOVE BIO SURGEON STRL SZ7 (GLOVE) ×1 IMPLANT
GOWN STRL REUS W/ TWL LRG LVL3 (GOWN DISPOSABLE) ×1 IMPLANT
GOWN STRL REUS W/ TWL XL LVL3 (GOWN DISPOSABLE) ×1 IMPLANT
GOWN STRL REUS W/TWL LRG LVL3 (GOWN DISPOSABLE) ×1
GOWN STRL REUS W/TWL XL LVL3 (GOWN DISPOSABLE) ×1
HANDLE YANKAUER SUCT BULB TIP (MISCELLANEOUS) ×1 IMPLANT
KIT TURNOVER KIT A (KITS) ×1 IMPLANT
LABEL OR SOLS (LABEL) ×1 IMPLANT
MANIFOLD NEPTUNE II (INSTRUMENTS) ×1 IMPLANT
NS IRRIG 1000ML POUR BTL (IV SOLUTION) ×1 IMPLANT
PACK EXTREMITY ARMC (MISCELLANEOUS) ×1 IMPLANT
PAD ABD DERMACEA PRESS 5X9 (GAUZE/BANDAGES/DRESSINGS) ×2 IMPLANT
PAD PREP 24X41 OB/GYN DISP (PERSONAL CARE ITEMS) ×1 IMPLANT
SPONGE T-LAP 18X18 ~~LOC~~+RFID (SPONGE) ×2 IMPLANT
STAPLER SKIN PROX 35W (STAPLE) ×1 IMPLANT
STOCKINETTE M/LG 89821 (MISCELLANEOUS) ×1 IMPLANT
SUT SILK 2 0 (SUTURE) ×1
SUT SILK 2 0 SH (SUTURE) ×2 IMPLANT
SUT SILK 2-0 18XBRD TIE 12 (SUTURE) ×1 IMPLANT
SUT VIC AB 0 CT1 36 (SUTURE) ×2 IMPLANT
SUT VIC AB 2-0 CT1 (SUTURE) ×2 IMPLANT
TRAP FLUID SMOKE EVACUATOR (MISCELLANEOUS) ×1 IMPLANT
WATER STERILE IRR 500ML POUR (IV SOLUTION) ×1 IMPLANT

## 2022-06-08 NOTE — Progress Notes (Signed)
UF off d/t sbp<80. S. Gwyneth Revels, NP present and aware. Other vss. Pt alert, safety maintained.

## 2022-06-08 NOTE — Progress Notes (Signed)
Pre HD RN assessment 

## 2022-06-08 NOTE — Anesthesia Preprocedure Evaluation (Signed)
Anesthesia Evaluation  Patient identified by MRN, date of birth, ID band Patient awake and Patient confused    Reviewed: Allergy & Precautions, NPO status , Patient's Chart, lab work & pertinent test results  Airway Mallampati: III  TM Distance: >3 FB Neck ROM: full    Dental  (+) Dental Advidsory Given, Poor Dentition,    Pulmonary neg pulmonary ROS, shortness of breath, COPD, former smoker,    Pulmonary exam normal breath sounds clear to auscultation       Cardiovascular Exercise Tolerance: Poor hypertension, + CAD, + Past MI and + Peripheral Vascular Disease  Normal cardiovascular exam Rhythm:Regular Rate:Normal  Hyperlipidemia  06/27/21 ECHO: 1. Left ventricular ejection fraction, by estimation, is 50 to 55%. The left ventricle has low normal function. Left ventricular endocardial border not optimally defined to evaluate egional wall motion. There is moderate left ventricular hypertrophy. Left ventricular diastolic parameters are consistent with Grade II diastolic dysfunction (pseudonormalization). Elevated left atrial pressure. 2. Right ventricular systolic function is normal. The right ventricular size is normal. 3. Tricuspid regurgitation signal is inadequate for assessing PA pressure. 4. Left atrial size was mildly dilated. 5. Right atrial size was mildly dilated. The mitral valve is degenerative. Trivial mitral valve regurgitation. No evidence of mitral stenosis. Moderate to severe mitral annular calcification. 6. The aortic valve is tricuspid. There is mild calcification of the aortic valve. There is mild thickening of the aortic valve. Aortic valve regurgitation is not visualized. Mild to moderate aortic valve sclerosis/calcification is present, without any evidence of aortic stenosis. 7. Aortic dilatation noted. There is borderline dilatation of the aortic root, measuring 38 mm. 8. The inferior vena cava is normal in size  with <50% respiratory variability, suggesting right atrial pressure of 8 mmHg.   Neuro/Psych Seizures -,  Anxiety Depression Dementia TIA Neuromuscular disease negative neurological ROS  negative psych ROS   GI/Hepatic negative GI ROS, (+) Hepatitis -, C  Endo/Other  negative endocrine ROSdiabetes  Renal/GU ESRFRenal disease     Musculoskeletal  (+) Arthritis , gout   Abdominal   Peds  Hematology negative hematology ROS (+)   Anesthesia Other Findings Past Medical History: No date: Abnormal stress test     Comment:  s/p cath November 2013 with modest disease involving the              ostial left main, proximal LAD, proximal RCA - do not               appear to be hemodynamically signficant; mild LV               dysfunction No date: Anemia     Comment:  low iron No date: Anxiety No date: Arthritis No date: Bacteremia No date: Chronic systolic CHF (congestive heart failure) (HCC) No date: COPD (chronic obstructive pulmonary disease) (HCC) No date: Coronary artery disease     Comment:  a. per cath report 2013, b. 09/13/2017-CABG X4 & Mitral               valve repair No date: Depression No date: DVT (deep venous thrombosis) (HCC)     Comment:  arm - right- finished blood thinner- not sure when it No date: Ejection fraction < 50% No date: Erectile dysfunction     Comment:  penile implant No date: ESRD (end stage renal disease) ([place])     Comment:  [facility] on a MWF schedule.    No date: Gout     Comment:  Hx:  of No date: Hepatitis C No date: History of seizures     Comment:  last one 2018 No date: Hyperlipidemia No date: Hypertension No date: Mitral valve disease     Comment:  s/p Mitral repair 09/13/17 No date: Obesity No date: Retinopathy No date: Shortness of breath     Comment:  Hx: of with exertion No date: Stroke [facility])     Comment:  "several" left side weakness, speech affected No date: Type II or unspecified type diabetes mellitus without  mention of  complication, not stated as uncontrolled     Comment:  adult onset  Past Surgical History: 10/04/2017: A/V FISTULAGRAM; N/A     Comment:  Procedure: A/V FISTULAGRAM;  Surgeon: Elam Dutch,              MD;  Location: [place] [facility];  Service:               Cardiovascular;  Laterality: N/A; 07/12/2020: A/V SHUNTOGRAM; Left     Comment:  Procedure: A/V SHUNTOGRAM;  Surgeon: Serafina Mitchell,               MD;  Location: [place] [facility];  Service:               Cardiovascular;  Laterality: Left; 07/08/2021: AMPUTATION; Right     Comment:  Procedure: AMPUTATION ABOVE KNEE;  Surgeon: Elmore Guise, MD;  Location: [facility];  Service: Vascular;                Laterality: Right; 09/24/2019: ANGIOPLASTY; Left     Comment:  Procedure: Angioplasty Innominate Vien;  Surgeon:               Serafina Mitchell, MD;  Location: [place] [facility];  Service:               Vascular;  Laterality: Left; 01/13/2013: AV FISTULA PLACEMENT; Left     Comment:  Procedure: INSERTION OF ARTERIOVENOUS (AV) GORE-TEX               GRAFT ARM;  Surgeon: Angelia Mould, MD;                Location: [place];  Service: Vascular;  Laterality: Left; 05/05/2013: AV FISTULA PLACEMENT; Left     Comment:  Procedure: INSERTION OF LEFT UPPER ARM  ARTERIOVENOUS               GORTEX GRAFT;  Surgeon: Angelia Mould, MD;                Location: [place];  Service: Vascular;  Laterality: Left; 11/26/2019: AV FISTULA PLACEMENT; Left     Comment:  Procedure: INSERTION OF ARTERIOVENOUS (AV) GORE-TEX               GRAFT, left  ARM;  Surgeon: Serafina Mitchell, MD;                Location: [place];  Service: Vascular;  Laterality: Left; 03/26/2013: [place]; Left     Comment:  Procedure: REMOVAL OF  NON INCORPORATED ARTERIOVENOUS               GORETEX GRAFT ([place]) left arm * repair of  left brachial               artery with vein patch angioplasty.;  Surgeon:  Angelia Mould, MD;  Location: [place];   Service:               Vascular;  Laterality: Left; August 26, 2012: CARDIAC CATHETERIZATION No date: CATARACT EXTRACTION W/ INTRAOCULAR LENS  IMPLANT, BILATERAL No date: COLONOSCOPY 09/13/2017: CORONARY ARTERY BYPASS GRAFT; N/A     Comment:  Procedure: CORONARY ARTERY BYPASS GRAFTING (CABG) times               four LIMA  to LAD, SVG sequentially to OM and RAMUS               Intermediate and SVG to Acute Marginal;  Surgeon:               Melrose Nakayama, MD;  Location: [place];  Service:               Open Heart Surgery;  Laterality: N/A; 02/28/2021: DIALYSIS/PERMA CATHETER INSERTION; N/A     Comment:  Procedure: DIALYSIS/PERMA CATHETER EXCHANGE;  Surgeon:               Katha Cabal, MD;  Location: [place] [facility];               Service: Cardiovascular;  Laterality: N/A; 08/27/2013: EMBOLECTOMY; Right     Comment:  Procedure: EMBOLECTOMY;  Surgeon: Mal Misty, MD;                Location: [place];  Service: Vascular;  Laterality: Right;               Thrombectomy of Radial and ulnar artery. 09/24/2019: ENDOVASCULAR STENT INSERTION; Left     Comment:  Procedure: Endovascular Stent Graft Insertion, Arm               Graft;  Surgeon: Serafina Mitchell, MD;  Location: [place];               Service: Vascular;  Laterality: Left; No date: EYE SURGERY     Comment:  laser.  and surgery for DM No date: fistula     Comment:  RUE and wrist 09/24/2019: FISTULOGRAM; Left     Comment:  Procedure: SHUNTOGRAM OF ARTERIOVENOUS FISTULA LEFT               ARM,;  Surgeon: Serafina Mitchell, MD;  Location: [facility];                Service: Vascular;  Laterality: Left; 01/01/2013: INSERTION OF DIALYSIS CATHETER; Right     Comment:  Procedure: INSERTION OF DIALYSIS CATHETER right                internal jugular;  Surgeon: Serafina Mitchell, MD;                Location: [place];  Service: Vascular;  Laterality: Right; 03/26/2013: INSERTION OF DIALYSIS CATHETER; N/A     Comment:  Procedure:  INSERTION OF DIALYSIS CATHETER Left internal               jugular vein;  Surgeon: Angelia Mould, MD;                Location: [place];  Service: Vascular;  Laterality: N/A; 11/26/2019: INSERTION OF DIALYSIS CATHETER; Right     Comment:  Procedure: INSERTION OF DIALYSIS CATHETER, right               internal jugular;  Surgeon: Serafina Mitchell, MD;  Location[facility];  Service: Vascular;  Laterality: Right; 09/30/2019: IR THORACENTESIS ASP PLEURAL SPACE W/IMG GUIDE 09/09/2017: LEFT HEART CATH AND CORONARY ANGIOGRAPHY; N/A     Comment:  Procedure: LEFT HEART CATH AND CORONARY ANGIOGRAPHY;                Surgeon: Troy Sine, MD;  Location: [place] [facility]               [facility];  Service: Cardiovascular;  Laterality: N/A; 12/30/2013: LIGATION OF ARTERIOVENOUS  FISTULA; Right     Comment:  Procedure: REMOVAL OF SEGMENT OF GORTEX GRAFT AND               FISTULA  AND REPAIR OF BRACHIAL ARTERY;  Surgeon: Mal Misty, MD;  Location: [place];  Service: Vascular;                Laterality: Right; 06/28/2021: LOWER EXTREMITY ANGIOGRAPHY; N/A     Comment:  Procedure: Lower Extremity Angiography;  Surgeon:               Katha Cabal, MD;  Location: [place] [facility];               Service: Cardiovascular;  Laterality: N/A; 11/23/2021: LOWER EXTREMITY ANGIOGRAPHY; Left     Comment:  Procedure: LOWER EXTREMITY ANGIOGRAPHY;  Surgeon: Algernon Huxley, MD;  Location: [place] [facility];  Service:               Cardiovascular;  Laterality: Left; 09/13/2017: MITRAL VALVE REPAIR; N/A     Comment:  Procedure: MITRAL VALVE REPAIR (MVR);  Surgeon:               Melrose Nakayama, MD;  Location: [place];  Service:               Open Heart Surgery;  Laterality: N/A; 09/13/2017: MITRAL VALVE REPAIR (MV)/CORONARY ARTERY BYPASS GRAFTING  (CABG) No date: PENILE PROSTHESIS IMPLANT     Comment:  1997.. no card 10/04/2017: PERIPHERAL VASCULAR BALLOON ANGIOPLASTY;  Left     Comment:  Procedure: PERIPHERAL VASCULAR BALLOON ANGIOPLASTY;                Surgeon: Elam Dutch, MD;  Location: [place] [facility]              [facility];  Service: Cardiovascular;  Laterality: Left;  AVF 07/12/2020: PERIPHERAL VASCULAR BALLOON ANGIOPLASTY; Left     Comment:  Procedure: PERIPHERAL VASCULAR BALLOON ANGIOPLASTY;                Surgeon: Serafina Mitchell, MD;  Location: [place] [facility]               [facility];  Service: Cardiovascular;  Laterality: Left;  ARM               SHUNTOGRAM 09/24/2019: REVISION OF ARTERIOVENOUS GORETEX GRAFT; Left     Comment:  Procedure: REVISION OF ARTERIOVENOUS GORETEX GRAFT LEFT               ARM;  Surgeon: Serafina Mitchell, MD;  Location[facility];                Service: Vascular;  Laterality: Left; 06/11/2013: TEE WITHOUT CARDIOVERSION; N/A     Comment:  Procedure: TRANSESOPHAGEAL ECHOCARDIOGRAM (  TEE);                Surgeon: Larey Dresser, MD;  Location: [place];                Service: Cardiovascular;  Laterality: N/A; 09/13/2017: TEE WITHOUT CARDIOVERSION; N/A     Comment:  Procedure: TRANSESOPHAGEAL ECHOCARDIOGRAM (TEE);                Surgeon: Melrose Nakayama, MD;  Location: [place];                Service: Open Heart Surgery;  Laterality: N/A; 03/26/2013: THROMBECTOMY W/ EMBOLECTOMY; Left     Comment:  Procedure: Attempted thrombectomy of left arm               arteriovenous goretex graft.;  Surgeon: Angelia Mould, MD;  Location: [facility] OR;  Service: Vascular;                Laterality: Left; 09/24/2019: ULTRASOUND GUIDANCE FOR VASCULAR ACCESS; Bilateral     Comment:  Procedure: Ultrasound Guidance For Vascular Access,               Right Femoral Vein and Left Arm Graft;  Surgeon: Serafina Mitchell, MD;  Location: [facility] OR;  Service: Vascular;                Laterality: Bilateral; 04/07/2013: VENOGRAM; N/A     Comment:  Procedure: VENOGRAM;  Surgeon: Serafina Mitchell, MD;                Location: [place]  CATH LAB;  Service: Cardiovascular;                Laterality: N/A;  BMI    Body Mass Index: 18.20 kg/m      Reproductive/Obstetrics negative OB ROS                             Anesthesia Physical  Anesthesia Plan  ASA: 4  Anesthesia Plan: General   Post-op Pain Management:    Induction: Intravenous  PONV Risk Score and Plan: 2 and Ondansetron, Dexamethasone, Midazolam and Treatment may vary due to age or medical condition  Airway Management Planned: Oral ETT  Additional Equipment:   Intra-op Plan:   Post-operative Plan: Extubation in OR  Informed Consent: I have reviewed the patients History and Physical, chart, labs and discussed the procedure including the risks, benefits and alternatives for the proposed anesthesia with the patient or authorized representative who has indicated his/her understanding and acceptance.     Dental Advisory Given  Plan Discussed with: Anesthesiologist, CRNA and Surgeon  Anesthesia Plan Comments: (Patient consented for risks of anesthesia including but not limited to:  - adverse reactions to medications - damage to eyes, teeth, lips or other oral mucosa - nerve damage due to positioning  - sore throat or hoarseness - Damage to heart, brain, nerves, lungs, other parts of body or loss of life  Patient voiced understanding.)        Anesthesia Quick Evaluation

## 2022-06-08 NOTE — Progress Notes (Signed)
Pt sbp>120. Fluid removal started. 2 hours remaining of tx. Pt no c/o, alert. Safety maintained.

## 2022-06-08 NOTE — Interval H&P Note (Signed)
History and Physical Interval Note:  06/08/2022 7:32 AM  Ernest [name]  has presented today for surgery, with the diagnosis of Gangrene.  The various methods of treatment have been discussed with the patient and family. After consideration of risks, benefits and other options for treatment, the patient's daughter has consented to  Procedure(s): AMPUTATION ABOVE KNEE (Left) as a surgical intervention. The patient has dementia and has been intermittently agreeable to left AKA as well.  No real other option except doing nothing. Primary service has discussed with the daughter as well and nursing staff has contacted daughter this morning for consent. The patient's history has been reviewed, patient examined, no change in status, stable for surgery.  I have reviewed the patient's chart and labs.  Questions were answered to the patient's satisfaction.     [name]

## 2022-06-08 NOTE — Op Note (Signed)
  [facility]   OPERATIVE NOTE   PROCEDURE:  Left above-the-knee amputation  PRE-OPERATIVE DIAGNOSIS: Left foot gangrene  POST-OPERATIVE DIAGNOSIS: same as above  SURGEON:  [name], MD  ASSISTANT(S): none  ANESTHESIA: general  ESTIMATED BLOOD LOSS: 100 cc  FINDING(S): none  SPECIMEN(S):  Left above-the-knee amputation  INDICATIONS:   Ernest [name] is a 76 y.o. male who presents with left foot gangrene.  The patient is scheduled for a left above-the-knee amputation.  I discussed in depth with the patient the risks, benefits, and alternatives to this procedure.  The patient is aware that the risk of this operation included but are not limited to:  bleeding, infection, myocardial infarction, stroke, death, failure to heal amputation wound, and possible need for more proximal amputation.  The patient is aware of the risks and agrees proceed forward with the procedure.  DESCRIPTION: After full informed written consent was obtained from the patient, the patient was taken to the operating room, and placed supine upon the operating table.  Prior to induction, the patient received IV antibiotics.  The patient was then prepped and draped in the standard fashion for a left above-the-knee amputation.  After obtaining adequate anesthesia, the patient was prepped and draped in the standard fashion for a above-the-knee amputation.  I marked out the anterior and posterior flaps for a fish-mouth type of amputation.  I made the incisions for these flaps, and then dissected through the subcutaneous tissue, fascia, and muscles circumferentially.  I elevated  the periosteal tissue 4-5 cm more proximal than the anterior skin flap.  I then transected the femur with a power saw at this level.  Then I smoothed out the rough edges of the bone.  At this point, the specimen was passed off the field as the above-the-knee amputation.  At this point, I clamped all visibly bleeding arteries  and veins using a combination of suture ligation with silk suture and electrocautery.   Bleeding continued to be controlled with electrocautery and suture ligature.  The stump was washed off with sterile normal saline and no further active bleeding was noted.  I reapproximated the anterior and posterior fascia  with interrupted stitches of 0 Vicryl.  This was completed along the entire length of anterior and posterior fascia until there were no more loose space in the fascial line. The subcutaneous tissue was then approximated with 2-0 vicryl sutures. The skin was then  reapproximated with staples.  The stump was washed off and dried.  The incision was dressed with Xeroform and ABD pads, and  then fluffs were applied.  Kerlix was wrapped around the leg and then gently an ACE wrap was applied.  A large Ioban was then placed over the ACE wrap to secure the dressing. The patient was then awakened and take to the recovery room in stable condition.   COMPLICATIONS: none  CONDITION: stable  [name]  06/08/2022, 10:16 AM   This note was created with Dragon Medical transcription system. Any errors in dictation are purely unintentional.

## 2022-06-08 NOTE — Progress Notes (Signed)
Pt HD complete w/ no complications. Alert, oriented x2, vss for baseline, no c/o, report to primary RN. Start: 3524 End: 2103 105ml fluid removed 75.6L BVP No meds given Bed scale error.

## 2022-06-08 NOTE — Progress Notes (Signed)
Post HD RN assessment 

## 2022-06-08 NOTE — Progress Notes (Signed)
Report from primary RN. Pt stabilized post left leg amputation. Ready for HD treatment.

## 2022-06-08 NOTE — Progress Notes (Signed)
Pharmacy Antibiotic Note  Ernest [name] is a 76 y.o. male admitted on 06/02/2022 with  wound/ gangrene foot infection .  Pharmacy has been consulted for Vancmycin and Unasyn dosing.  -Hemodialysis pt - for AKA today 8/25  Plan: -Vancomycin 500mg  IV QHD-MWF after dialysis for weight=56g (Loading dose of Vancomcin 1250mg  given 8/21 @1705 , the 1st HD after LD was 8/23)  -Unasyn 3 gm IV q24h after dialysis  Bcx NGx5d, plan AKA    Height: 5\' 9"  (175.3 cm) Weight: 55.9 kg (123 lb 3.8 oz) IBW/kg (Calculated) : 70.7  Temp (24hrs), Avg:97.2 F (36.2 C), Min:96.8 F (36 C), Max:97.7 F (36.5 C)  Recent Labs  Lab 06/02/22 1526 06/02/22 1625 06/03/22 0534 06/04/22 0540 06/08/22 0815  WBC 8.3  --  8.0 8.8 9.9  CREATININE 2.94*  --  3.46* 4.45* 4.57*  LATICACIDVEN 1.0 1.0  --   --   --      Estimated Creatinine Clearance: 11.4 mL/min (A) (by C-G formula based on SCr of 4.57 mg/dL (H)).    [name]  Antimicrobials this admission: Vanc 8/21 >> Unasyn 8/21>>  Dose adjustments this admission:    Microbiology results: 8/19 BCx: NG 5d   UCx:      Sputum:      MRSA PCR:    Thank you for allowing pharmacy to be a part of this patient's care.  Nysia Dell A 06/08/2022 10:50 AM

## 2022-06-08 NOTE — Progress Notes (Signed)
Pt return for surgery. Alert to voice, drowsy. No complaints of pain. Surgical site dressing in place clean dry intact. Left leg positioned upon pillow to keep elevated post amputation. Pt presents no questions or concerns at this time.

## 2022-06-08 NOTE — Progress Notes (Signed)
[facility]  ROUNDING NOTE   Subjective:   Ernest [name] is a 76 year old African-American male with past medical conditions including CAD, COPD, CHF, diabetes, stroke with late seizures, hypertension, and end-stage renal disease on hemodialysis.  Patient presents to the emergency department from [facility] with concerns of left foot wound and has been admitted for Dry gangrene [facility]) [I96]  Patient is known to our practice from previous admissions and receives outpatient dialysis treatments at [facility] on a MWF schedule, supervised by [facility] physicians.    Patient seen later this afternoon after surgical procedure Patient currently alert Complaining of pain in sacral area Dialysis scheduled for later today   Objective:  Vital signs in last 24 hours:  Temp:  [96.8 F (36 C)-98.3 F (36.8 C)] 98.3 F (36.8 C) (08/25 1557) Pulse Rate:  [69-90] 83 (08/25 1557) Resp:  [11-24] 18 (08/25 1557) BP: (90-164)/(48-135) 125/73 (08/25 1557) SpO2:  [92 %-100 %] 100 % (08/25 1557)  Weight change:  Filed Weights   06/02/22 2026 06/06/22 0809 06/06/22 1230  Weight: 57.9 kg 56.9 kg 55.9 kg    Intake/Output: I/O last 3 completed shifts: In: 219.9 [IV Piggyback:219.9] Out: -    Intake/Output this shift:  Total I/O In: 620 [P.O.:220; I.V.:200; IV Piggyback:200] Out: 100 [Blood:100]  Physical Exam: General: NAD  Head: Normocephalic, atraumatic. Moist oral mucosal membranes  Eyes: Anicteric  Lungs:  Clear to auscultation, normal effort, room air  Heart: Regular rate and rhythm  Abdomen:  Soft, nontender, nondistended  Extremities: No peripheral edema.  Neurologic: Nonfocal, right and left AKA  Skin: Surgical dressing left AKA  Access: Right thigh PermCath    Basic Metabolic Panel: Recent Labs  Lab 06/02/22 1526 06/03/22 0534 06/04/22 0540 06/08/22 0815  NA 134* 135 135 136  K 2.6* 3.1* 3.2* 3.4*  CL 97* 98 98 99  CO2 28 28 28 28   GLUCOSE 170*  80 112* 135*  BUN 26* 32* 39* 42*  CREATININE 2.94* 3.46* 4.45* 4.57*  CALCIUM 7.4* 7.9* 8.3* 9.0  PHOS  --   --  2.8  --      Liver Function Tests: Recent Labs  Lab 06/02/22 1526 06/04/22 0540  AST 16  --   ALT 9  --   ALKPHOS 85  --   BILITOT 0.9  --   PROT 6.2*  --   ALBUMIN 2.2* 2.3*    No results for input(s): "LIPASE", "AMYLASE" in the last 168 hours. No results for input(s): "AMMONIA" in the last 168 hours.  CBC: Recent Labs  Lab 06/02/22 1526 06/03/22 0534 06/04/22 0540 06/08/22 0815  WBC 8.3 8.0 8.8 9.9  NEUTROABS 6.2  --   --   --   HGB 11.6* 11.4* 11.1* 11.3*  HCT 37.3* 36.9* 35.5* 36.1*  MCV 80.4 79.5* 79.2* 78.8*  PLT 269 251 306 323     Cardiac Enzymes: No results for input(s): "CKTOTAL", "CKMB", "CKMBINDEX", "TROPONINI" in the last 168 hours.  BNP: Invalid input(s): "POCBNP"  CBG: Recent Labs  Lab 06/03/22 1644 06/03/22 2052 06/04/22 2209 06/06/22 1423 06/08/22 1028  GLUCAP 153* 157* 148* 113* 129*     Microbiology: Results for orders placed or performed during the hospital encounter of 06/02/22  Blood culture (routine x 2)     Status: None   Collection Time: 06/02/22  4:24 PM   Specimen: BLOOD  Result Value Ref Range Status   Specimen Description BLOOD LEFT ANTECUBITAL  Final   Special Requests  Final    BOTTLES DRAWN AEROBIC AND ANAEROBIC Blood Culture results may not be optimal due to an inadequate volume of blood received in culture bottles   Culture   Final    NO GROWTH 5 DAYS Performed at [facility], [address] [place], [place] [address]    Report Status 06/07/2022 FINAL  Final  Blood culture (routine x 2)     Status: None   Collection Time: 06/02/22  8:45 PM   Specimen: BLOOD LEFT HAND  Result Value Ref Range Status   Specimen Description BLOOD LEFT HAND  Final   Special Requests   Final    BOTTLES DRAWN AEROBIC AND ANAEROBIC Blood Culture adequate volume   Culture   Final    NO GROWTH 5  DAYS Performed at [facility], [address] [place], [place] [address]    Report Status 06/07/2022 FINAL  Final    Coagulation Studies: No results for input(s): "LABPROT", "INR" in the last 72 hours.  Urinalysis: No results for input(s): "COLORURINE", "LABSPEC", "PHURINE", "GLUCOSEU", "HGBUR", "BILIRUBINUR", "KETONESUR", "PROTEINUR", "UROBILINOGEN", "NITRITE", "LEUKOCYTESUR" in the last 72 hours.  Invalid input(s): "APPERANCEUR"    Imaging: No results found.   Medications:    ampicillin-sulbactam (UNASYN) IV Stopped (06/07/22 1836)   levETIRAcetam Stopped (06/07/22 2250)   levETIRAcetam Stopped (06/06/22 2226)   vancomycin Stopped (06/06/22 1405)    (feeding supplement) PROSource Plus  30 mL Oral TID BM   apixaban  2.5 mg Oral BID   vitamin C  500 mg Oral BID   atorvastatin  40 mg Oral Daily   Chlorhexidine Gluconate Cloth  6 each Topical Daily   Chlorhexidine Gluconate Cloth  6 each Topical Once   DULoxetine  40 mg Oral BID   feeding supplement (NEPRO CARB STEADY)  237 mL Oral TID BM   midodrine  5 mg Oral TID WC   multivitamin  1 tablet Oral QHS   pantoprazole  40 mg Oral Daily   polyethylene glycol  17 g Oral Daily   senna-docusate  1 tablet Oral QHS   sevelamer carbonate  800 mg Oral TID WC   zinc sulfate  220 mg Oral Daily   acetaminophen, haloperidol lactate, HYDROmorphone (DILAUDID) injection, ondansetron **OR** ondansetron (ZOFRAN) IV, ondansetron (ZOFRAN) IV, oxyCODONE-acetaminophen  Assessment/ Plan:  [name] is a 76 y.o.  male  with past medical conditions including CAD, COPD, CHF, diabetes, stroke with late seizures, hypertension, and end-stage renal disease on hemodialysis.  Patient presents to the emergency department from [facility] with concerns of left foot wound and has been admitted for 06/02/2022  3:25 PM   [facility]/MWF/right thigh PermCath  End-stage renal disease on hemodialysis.   Dialysis scheduled  later today after surgical procedure.  Next treatment scheduled for Monday.  We will monitor discharge planning to assess changes in outpatient needs.  2. Anemia of chronic kidney disease Lab Results  Component Value Date   HGB 11.3 (L) 06/08/2022    Patient receives [facility] outpatient.  Hemoglobin at goal.  3. Secondary Hyperparathyroidism: with outpatient labs: PTH 534, phosphorus 4.5, calcium 8.0 on 05/21/2022.   Lab Results  Component Value Date   PTH 63 10/05/2017   PTH Comment 10/05/2017   CALCIUM 9.0 06/08/2022   CAION 1.05 (L) 07/12/2020   PHOS 2.8 06/04/2022  Calcium and phosphorus within acceptable range.  We will continue to monitor. Continue sevelamer with meals.  4.  Hypertension with chronic kidney disease.  Home  regimen includes amlodipine. Blood pressure soft this morning, 85/58.  Amlodipine stopped, patient placed on midodrine 5 mg 3 times daily. Blood pressure currently 125/73.  5.  Dry gangrene, suspected on left foot.  Vascular surgery consulted and suspect a AKA may be needed. Patient refusing surgery however POA agreeable.  Patient received a left AKA today.    LOS: 6   8/25/20234:39 PM

## 2022-06-08 NOTE — Transfer of Care (Signed)
Immediate Anesthesia Transfer of Care Note  Patient: Ernest [name]  Procedure(s) Performed: AMPUTATION ABOVE KNEE (Left: Knee)  Patient Location: PACU  Anesthesia Type:General  Level of Consciousness: drowsy  Airway & Oxygen Therapy: Patient Spontanous Breathing and Patient connected to face mask oxygen  Post-op Assessment: Report given to RN and Post -op Vital signs reviewed and stable  Post vital signs: Reviewed and stable  Last Vitals:  Vitals Value Taken Time  BP 105/61 06/08/22 1024  Temp 36.1 C 06/08/22 1024  Pulse 80 06/08/22 1029  Resp 18 06/08/22 1029  SpO2 100 % 06/08/22 1029  Vitals shown include unvalidated device data.  Last Pain:  Vitals:   06/08/22 0758  TempSrc: Temporal  PainSc:          Complications: No notable events documented.

## 2022-06-08 NOTE — Progress Notes (Deleted)
UF off, pt c/o cramps, vss. Safety maintained. CCHT at bedside.

## 2022-06-08 NOTE — Progress Notes (Signed)
  Progress Note   Patient: Ernest [name] HBZ:[id number] DOB: 04-21-1946 DOA: 06/02/2022     6 DOS: the patient was seen and examined on 06/08/2022   Brief hospital course: 76 year old man with chronic systolic congestive heart failure, COPD, end-stage renal disease, hepatitis C, hypertension, stroke.  Patient brought in by family secondary to gangrene left foot.  Started empirically on antibiotics.  On 06/04/2022 patient refused dialysis and refused medications.  Patient declined amputation at this time.  Appreciate palliative care consultation and patient was made a DO NOT RESUSCITATE.  8/23.  Patient was agreeable and dialyzed today. 8/25.  In OR for amputation.  Assessment and Plan: Dry gangrene of the left foot. Peripheral vascular disease. Right AKA. Patient is scheduled to have left foot amputation today.  Continue antibiotics.   End-stage renal disease on dialysis Secondary hyperparathyroidism. Hypokalemia. Hypotension. Dialysis per nephrology.  Continue midodrine.  Blood pressure is better.   Vascular dementia. History of stroke. 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 Condition appears to be stable.   Chronic systolic congestive heart failure. Stable.   Stage II gluteal cleft pressure ulcer. Follow.   Severe protein calorie malnutrition. Continue protein supplements.      Subjective:  Patient feels well today, baseline confusion, no other complaints.  Physical Exam: Vitals:   06/07/22 2009 06/08/22 0513 06/08/22 0743 06/08/22 0758  BP: (!) 90/52 (!) 164/48 136/76 130/78  Pulse: 88 87 85 85  Resp: 16 17 18 17   Temp: 97.7 F (36.5 C) 97.6 F (36.4 C) (!) 96.8 F (36 C) (!) 96.8 F (36 C)  TempSrc:    Temporal  SpO2: 96% 98% 100% 96%  Weight:      Height:       General exam: Appears calm and comfortable, severely malnourished. Respiratory system: Clear to auscultation. Respiratory effort normal. Cardiovascular system: S1 & S2 heard, RRR. No JVD, murmurs,  rubs, gallops or clicks. No pedal edema. Gastrointestinal system: Abdomen is nondistended, soft and nontender. No organomegaly or masses felt. Normal bowel sounds heard. Central nervous system: Alert and oriented x2. No focal neurological deficits. Extremities: right AKA Skin: No rashes, lesions or ulcers Psychiatry: Judgement and insight appear normal. Mood & affect appropriate.   Data Reviewed:  There are no new results to review at this time.  Family Communication: None  Disposition: Status is: Inpatient Remains inpatient appropriate because: Severity of disease, inpatient procedure, IV antibiotics.  Planned Discharge Destination:  pending    Time spent: 35 minutes  Author: Sharen Hones, MD 06/08/2022 10:11 AM  For on call review [email or web address].

## 2022-06-09 DIAGNOSIS — F4329 Adjustment disorder with other symptoms: Secondary | ICD-10-CM | POA: Diagnosis not present

## 2022-06-09 DIAGNOSIS — N186 End stage renal disease: Secondary | ICD-10-CM | POA: Diagnosis not present

## 2022-06-09 DIAGNOSIS — Z992 Dependence on renal dialysis: Secondary | ICD-10-CM | POA: Diagnosis not present

## 2022-06-09 DIAGNOSIS — I96 Gangrene, not elsewhere classified: Secondary | ICD-10-CM | POA: Diagnosis not present

## 2022-06-09 LAB — BASIC METABOLIC PANEL
Anion gap: 8 (ref 5–15)
BUN: 24 mg/dL — ABNORMAL HIGH (ref 8–23)
CO2: 28 mmol/L (ref 22–32)
Calcium: 8.5 mg/dL — ABNORMAL LOW (ref 8.9–10.3)
Chloride: 100 mmol/L (ref 98–111)
Creatinine, Ser: 2.85 mg/dL — ABNORMAL HIGH (ref 0.61–1.24)
GFR, Estimated: 23 mL/min — ABNORMAL LOW (ref >60)
Glucose, Bld: 128 mg/dL — ABNORMAL HIGH (ref 70–99)
Potassium: 3.9 mmol/L (ref 3.5–5.1)
Sodium: 136 mmol/L (ref 135–145)

## 2022-06-09 LAB — CBC
HCT: 30.2 % — ABNORMAL LOW (ref 39.0–52.0)
Hemoglobin: 9.6 g/dL — ABNORMAL LOW (ref 13.0–17.0)
MCH: 25.3 pg — ABNORMAL LOW (ref 26.0–34.0)
MCHC: 31.8 g/dL (ref 30.0–36.0)
MCV: 79.7 fL — ABNORMAL LOW (ref 80.0–100.0)
Platelets: 349 10*3/uL (ref 150–400)
RBC: 3.79 MIL/uL — ABNORMAL LOW (ref 4.22–5.81)
RDW: 19.6 % — ABNORMAL HIGH (ref 11.5–15.5)
WBC: 17.9 10*3/uL — ABNORMAL HIGH (ref 4.0–10.5)
nRBC: 0 % (ref 0.0–0.2)

## 2022-06-09 LAB — MAGNESIUM: Magnesium: 2 mg/dL (ref 1.7–2.4)

## 2022-06-09 NOTE — Progress Notes (Signed)
      Daily Progress Note   Assessment/Planning:   POD #1 s/p L AKA  Pt tolerating pain Bdg off Monday per Dr. Ronn Melena H/H tomorrow   Subjective  - 1 Day Post-Op   Sleepy, no pain   Objective   Vitals:   06/08/22 2103 06/08/22 2106 06/08/22 2242 06/09/22 0804  BP: (!) 75/54  (!) 136/92 129/77  Pulse: (!) 105  99 100  Resp: 11  18 18   Temp: 97.6 F (36.4 C)  97.6 F (36.4 C) (!) 96.2 F (35.7 C)  TempSrc: Oral     SpO2: 100% 100% 100% 100%  Weight:      Height:         Intake/Output Summary (Last 24 hours) at 06/09/2022 0833 Last data filed at 06/08/2022 2103 Gross per 24 hour  Intake 620 ml  Output 1100 ml  Net -480 ml    VASC L AKA bandage without active bleeding    Laboratory   CBC    Latest Ref Rng & Units 06/09/2022    5:00 AM 06/08/2022    8:15 AM 06/04/2022    5:40 AM  CBC  WBC 4.0 - 10.5 K/uL 17.9  9.9  8.8   Hemoglobin 13.0 - 17.0 g/dL 9.6  11.3  11.1   Hematocrit 39.0 - 52.0 % 30.2  36.1  35.5   Platelets 150 - 400 K/uL 349  323  306     BMET    Component Value Date/Time   NA 136 06/09/2022 0500   NA 140 12/11/2017 0000   K 3.9 06/09/2022 0500   CL 100 06/09/2022 0500   CO2 28 06/09/2022 0500   GLUCOSE 128 (H) 06/09/2022 0500   BUN 24 (H) 06/09/2022 0500   BUN 16 12/11/2017 0000   CREATININE 2.85 (H) 06/09/2022 0500   CALCIUM 8.5 (L) 06/09/2022 0500   CALCIUM 8.4 (L) 10/05/2017 0615   GFRNONAA 23 (L) 06/09/2022 0500   GFRAA 21 (L) 12/04/2019 2027     Adele Barthel, MD, FACS, FSVS Covering for [facility]: [phone]  06/09/2022, 8:33 AM

## 2022-06-09 NOTE — Progress Notes (Signed)
[facility]  ROUNDING NOTE   Subjective:   Ernest [name] is a 76 year old African-American male with past medical conditions including CAD, COPD, CHF, diabetes, stroke with late seizures, hypertension, and end-stage renal disease on hemodialysis.  Patient presents to the emergency department from [facility] with concerns of left foot wound and has been admitted for Dry gangrene [facility]) [I96]  Patient is known to our practice from previous admissions and receives outpatient dialysis treatments at [facility] on a MWF schedule, supervised by [facility] physicians.    Patient was seen today in his room Patient is lying comfortably in the bed Patient main concern was if somebody is going to help him with his hair   Objective:  Vital signs in last 24 hours:  Temp:  [96.2 F (35.7 C)-98.3 F (36.8 C)] 96.2 F (35.7 C) (08/26 0804) Pulse Rate:  [69-105] 100 (08/26 0804) Resp:  [8-24] 18 (08/26 0804) BP: (75-164)/(34-124) 129/77 (08/26 0804) SpO2:  [92 %-100 %] 100 % (08/26 0804)  Weight change:  Filed Weights   06/02/22 2026 06/06/22 0809 06/06/22 1230  Weight: 57.9 kg 56.9 kg 55.9 kg    Intake/Output: I/O last 3 completed shifts: In: 620 [P.O.:220; I.V.:200; IV Piggyback:200] Out: 1100 [Other:1000; Blood:100]   Intake/Output this shift:  No intake/output data recorded.  Physical Exam: General: NAD  Head: Normocephalic, atraumatic. Moist oral mucosal membranes  Eyes: Anicteric  Lungs:  Clear to auscultation, normal effort, room air  Heart: Regular rate and rhythm  Abdomen:  Soft, nontender, nondistended  Extremities: No peripheral edema.  Neurologic: Nonfocal, right and left AKA  Skin: Surgical dressing left AKA  Access: Right thigh PermCath    Basic Metabolic Panel: Recent Labs  Lab 06/02/22 1526 06/03/22 0534 06/04/22 0540 06/08/22 0815 06/09/22 0500  NA 134* 135 135 136 136  K 2.6* 3.1* 3.2* 3.4* 3.9  CL 97* 98 98 99 100  CO2 28 28 28 28  28   GLUCOSE 170* 80 112* 135* 128*  BUN 26* 32* 39* 42* 24*  CREATININE 2.94* 3.46* 4.45* 4.57* 2.85*  CALCIUM 7.4* 7.9* 8.3* 9.0 8.5*  MG  --   --   --   --  2.0  PHOS  --   --  2.8  --   --     Liver Function Tests: Recent Labs  Lab 06/02/22 1526 06/04/22 0540  AST 16  --   ALT 9  --   ALKPHOS 85  --   BILITOT 0.9  --   PROT 6.2*  --   ALBUMIN 2.2* 2.3*   No results for input(s): "LIPASE", "AMYLASE" in the last 168 hours. No results for input(s): "AMMONIA" in the last 168 hours.  CBC: Recent Labs  Lab 06/02/22 1526 06/03/22 0534 06/04/22 0540 06/08/22 0815 06/09/22 0500  WBC 8.3 8.0 8.8 9.9 17.9*  NEUTROABS 6.2  --   --   --   --   HGB 11.6* 11.4* 11.1* 11.3* 9.6*  HCT 37.3* 36.9* 35.5* 36.1* 30.2*  MCV 80.4 79.5* 79.2* 78.8* 79.7*  PLT 269 251 306 323 349    Cardiac Enzymes: No results for input(s): "CKTOTAL", "CKMB", "CKMBINDEX", "TROPONINI" in the last 168 hours.  BNP: Invalid input(s): "POCBNP"  CBG: Recent Labs  Lab 06/03/22 1644 06/03/22 2052 06/04/22 2209 06/06/22 1423 06/08/22 1028  GLUCAP 153* 157* 148* 113* 129*    Microbiology: Results for orders placed or performed during the hospital encounter of 06/02/22  Blood culture (routine x 2)  Status: None   Collection Time: 06/02/22  4:24 PM   Specimen: BLOOD  Result Value Ref Range Status   Specimen Description BLOOD LEFT ANTECUBITAL  Final   Special Requests   Final    BOTTLES DRAWN AEROBIC AND ANAEROBIC Blood Culture results may not be optimal due to an inadequate volume of blood received in culture bottles   Culture   Final    NO GROWTH 5 DAYS Performed at [facility], [address], [place], [place] [address]    Report Status 06/07/2022 FINAL  Final  Blood culture (routine x 2)     Status: None   Collection Time: 06/02/22  8:45 PM   Specimen: BLOOD LEFT HAND  Result Value Ref Range Status   Specimen Description BLOOD LEFT HAND  Final   Special Requests   Final     BOTTLES DRAWN AEROBIC AND ANAEROBIC Blood Culture adequate volume   Culture   Final    NO GROWTH 5 DAYS Performed at [facility], [address] [place], [place] [address]    Report Status 06/07/2022 FINAL  Final    Coagulation Studies: No results for input(s): "LABPROT", "INR" in the last 72 hours.  Urinalysis: No results for input(s): "COLORURINE", "LABSPEC", "PHURINE", "GLUCOSEU", "HGBUR", "BILIRUBINUR", "KETONESUR", "PROTEINUR", "UROBILINOGEN", "NITRITE", "LEUKOCYTESUR" in the last 72 hours.  Invalid input(s): "APPERANCEUR"    Imaging: No results found.   Medications:    ampicillin-sulbactam (UNASYN) IV 3 g (06/08/22 2257)   levETIRAcetam 500 mg (06/09/22 6387)   levETIRAcetam 500 mg (06/08/22 2302)   vancomycin Stopped (06/06/22 1405)    (feeding supplement) PROSource Plus  30 mL Oral TID BM   apixaban  2.5 mg Oral BID   vitamin C  500 mg Oral BID   atorvastatin  40 mg Oral Daily   Chlorhexidine Gluconate Cloth  6 each Topical Daily   Chlorhexidine Gluconate Cloth  6 each Topical Once   DULoxetine  40 mg Oral BID   feeding supplement (NEPRO CARB STEADY)  237 mL Oral TID BM   midodrine  5 mg Oral TID WC   multivitamin  1 tablet Oral QHS   pantoprazole  40 mg Oral Daily   polyethylene glycol  17 g Oral Daily   senna-docusate  1 tablet Oral QHS   sevelamer carbonate  800 mg Oral TID WC   zinc sulfate  220 mg Oral Daily   acetaminophen, haloperidol lactate, HYDROmorphone (DILAUDID) injection, ondansetron **OR** ondansetron (ZOFRAN) IV, ondansetron (ZOFRAN) IV, oxyCODONE-acetaminophen  Assessment/ Plan:  [name] is a 76 y.o.  male  with past medical conditions including CAD, COPD, CHF, diabetes, stroke with late seizures, hypertension, and end-stage renal disease on hemodialysis.  Patient presents to the emergency department from [facility] with concerns of left foot wound and has been admitted for 06/02/2022  3:25 PM   [facility]  [place]/MWF/right thigh PermCath  End-stage renal disease on hemodialysis.   Patient received his last dialysis yesterday Patient is a Monday Wednesday Friday schedule. No need for renal placement therapy today We will monitor discharge planning to assess changes in outpatient needs.  2. Anemia of chronic kidney disease Lab Results  Component Value Date   HGB 9.6 (L) 06/09/2022    Patient receives [place] outpatient.  Hemoglobin at goal.  3. Secondary Hyperparathyroidism: with outpatient labs: PTH 534, phosphorus 4.5, calcium 8.0 on 05/21/2022.   Lab Results  Component Value Date   PTH 63 10/05/2017   PTH Comment  10/05/2017   CALCIUM 8.5 (L) 06/09/2022   CAION 1.05 (L) 07/12/2020   PHOS 2.8 06/04/2022  Calcium and phosphorus within acceptable range.  We will continue to monitor. Continue sevelamer with meals.  4.  Hypertension with chronic kidney disease.  Home regimen includes amlodipine. Blood pressure soft this morning, 85/58.  Amlodipine stopped, patient placed on midodrine 5 mg 3 times daily. Blood pressure currently 125/73.  5.  Dry gangrene,  on left foot.  Patient was admitted with dry gangrene.  Patient is now s/p AKA Vascular surgery and primary team are following  7.Hypokalemia Now better    LOS: 7 Daveion Robar s [facility] 8/26/20239:23 AM

## 2022-06-09 NOTE — Progress Notes (Signed)
  Progress Note   Patient: Ernest [name] HER:[id number] DOB: [birth year] DOA: 06/02/2022     7 DOS: the patient was seen and examined on 06/09/2022   Brief hospital course: 76 year old man with chronic systolic congestive heart failure, COPD, end-stage renal disease, hepatitis C, hypertension, stroke.  Patient brought in by family secondary to gangrene left foot.  Started empirically on antibiotics.  On 06/04/2022 patient refused dialysis and refused medications.  Patient declined amputation at this time.  Appreciate palliative care consultation and patient was made a DO NOT RESUSCITATE.  8/23.  Patient was agreeable and dialyzed today. 8/25.  Left AKA  Assessment and Plan: Dry gangrene of the left foot.  Status post left AKA. Peripheral vascular disease. Right AKA. Patient now has bilateral AKA. Foot gangrene has been essentially resolved after amputation, will discontinue antibiotics.   End-stage renal disease on dialysis Secondary hyperparathyroidism. Hypokalemia. Hypotension. Continue midodrine, continue scheduled dialysis.   Vascular dementia. History of stroke. 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 Stable.   Chronic systolic congestive heart failure. Stable.   Stage II gluteal cleft pressure ulcer. Follow.   Severe protein calorie malnutrition. Continue protein supplements.      Subjective:  Patient does not having complaint today.  Physical Exam: Vitals:   06/08/22 2103 06/08/22 2106 06/08/22 2242 06/09/22 0804  BP: (!) 75/54  (!) 136/92 129/77  Pulse: (!) 105  99 100  Resp: 11  18 18   Temp: 97.6 F (36.4 C)  97.6 F (36.4 C) (!) 96.2 F (35.7 C)  TempSrc: Oral     SpO2: 100% 100% 100% 100%  Weight:      Height:       General exam: Appears calm and comfortable, severely malnourished. Respiratory system: Clear to auscultation. Respiratory effort normal. Cardiovascular system: S1 & S2 heard, RRR. No JVD, murmurs, rubs, gallops or clicks. No pedal  edema. Gastrointestinal system: Abdomen is nondistended, soft and nontender. No organomegaly or masses felt. Normal bowel sounds heard. Central nervous system: Alert and oriented x2. No focal neurological deficits. Extremities: Bilateral AKA. Skin: No rashes, lesions or ulcers Psychiatry: Judgement and insight appear normal. Mood & affect appropriate.   Data Reviewed:  Lab results reviewed.  Family Communication: none  Disposition: Status is: Inpatient Remains inpatient appropriate because: Severity of disease,  Planned Discharge Destination: Skilled nursing facility    Time spent: 25 minutes  Author: Sharen Hones, MD 06/09/2022 11:38 AM  For on call review [email or web address].

## 2022-06-10 DIAGNOSIS — N186 End stage renal disease: Secondary | ICD-10-CM | POA: Diagnosis not present

## 2022-06-10 DIAGNOSIS — I96 Gangrene, not elsewhere classified: Secondary | ICD-10-CM | POA: Diagnosis not present

## 2022-06-10 DIAGNOSIS — Z992 Dependence on renal dialysis: Secondary | ICD-10-CM | POA: Diagnosis not present

## 2022-06-10 DIAGNOSIS — F4329 Adjustment disorder with other symptoms: Secondary | ICD-10-CM | POA: Diagnosis not present

## 2022-06-10 LAB — CBC
HCT: 28.5 % — ABNORMAL LOW (ref 39.0–52.0)
Hemoglobin: 9.1 g/dL — ABNORMAL LOW (ref 13.0–17.0)
MCH: 25 pg — ABNORMAL LOW (ref 26.0–34.0)
MCHC: 31.9 g/dL (ref 30.0–36.0)
MCV: 78.3 fL — ABNORMAL LOW (ref 80.0–100.0)
Platelets: 326 10*3/uL (ref 150–400)
RBC: 3.64 MIL/uL — ABNORMAL LOW (ref 4.22–5.81)
RDW: 19.7 % — ABNORMAL HIGH (ref 11.5–15.5)
WBC: 17.6 10*3/uL — ABNORMAL HIGH (ref 4.0–10.5)
nRBC: 0 % (ref 0.0–0.2)

## 2022-06-10 NOTE — Progress Notes (Signed)
  Progress Note   Patient: Ernest [name] JKD:[id number] DOB: 01-22-1946 DOA: 06/02/2022     8 DOS: the patient was seen and examined on 06/10/2022   Brief hospital course: 76 year old man with chronic systolic congestive heart failure, COPD, end-stage renal disease, hepatitis C, hypertension, stroke.  Patient brought in by family secondary to gangrene left foot.  Started empirically on antibiotics.  On 06/04/2022 patient refused dialysis and refused medications.  Patient declined amputation at this time.  Appreciate palliative care consultation and patient was made a DO NOT RESUSCITATE.  8/23.  Patient was agreeable and dialyzed today. 8/25.  Left AKA  Assessment and Plan: Dry gangrene of the left foot.  Status post left AKA. Peripheral vascular disease. Right AKA. Condition stable.  Antibiotics completed. Patient still has some leukocytosis likely secondary to surgical stress, repeat a CBC tomorrow.   End-stage renal disease on dialysis Secondary hyperparathyroidism. Hypokalemia. Hypotension. Blood pressure more stable on midodrine.  Dialysis per nephrology.   Vascular dementia. History of stroke. 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 Stable.   Chronic systolic congestive heart failure. Stable.   Stage II gluteal cleft pressure ulcer. Follow.   Severe protein calorie malnutrition. Patient is hand fed.  Spoke with nurse, patient is eating.      Subjective:  Patient appear confused at baseline.  No shortness of breath.  Spoke with the nurse, patient was eating when fed.  Physical Exam: Vitals:   06/09/22 1612 06/09/22 2110 06/10/22 0544 06/10/22 0734  BP: 124/74 128/66 137/81 (!) 118/59  Pulse: (!) 103 (!) 48 (!) 107 (!) 109  Resp: 15 18 16 17   Temp: 97.9 F (36.6 C) 98.2 F (36.8 C) 98.1 F (36.7 C) 98.2 F (36.8 C)  TempSrc:      SpO2: 96% 93% 99% 91%  Weight:      Height:       General exam: Cachectic, severely malnourished. Respiratory system: Clear to  auscultation. Respiratory effort normal. Cardiovascular system: S1 & S2 heard, RRR. No JVD, murmurs, rubs, gallops or clicks. No pedal edema. Gastrointestinal system: Abdomen is nondistended, soft and nontender. No organomegaly or masses felt. Normal bowel sounds heard. Central nervous system: Alert and oriented x2. No focal neurological deficits. Extremities: Bilateral AKA. Skin: No rashes, lesions or ulcers   Data Reviewed:  There are no new results to review at this time.  Family Communication:   Disposition: Status is: Inpatient Remains inpatient appropriate because: Status post amputation, planning to transfer back to nursing home Monday.  Planned Discharge Destination: Skilled nursing facility    Time spent: 28 minutes  Author: Sharen Hones, MD 06/10/2022 10:25 AM  For on call review [email or web address].

## 2022-06-10 NOTE — Progress Notes (Signed)
      Daily Progress Note   Assessment/Planning:   POD #2 s/p L AKA  Pt minimal responsive Bdg off tomorrow with Dr. Lucky Cowboy Relative stable H/H   Subjective  - 2 Days Post-Op   somulent   Objective   Vitals:   06/09/22 1612 06/09/22 2110 06/10/22 0544 06/10/22 0734  BP: 124/74 128/66 137/81 (!) 118/59  Pulse: (!) 103 (!) 48 (!) 107 (!) 109  Resp: 15 18 16 17   Temp: 97.9 F (36.6 C) 98.2 F (36.8 C) 98.1 F (36.7 C) 98.2 F (36.8 C)  TempSrc:      SpO2: 96% 93% 99% 91%  Weight:      Height:         Intake/Output Summary (Last 24 hours) at 06/10/2022 0945 Last data filed at 06/09/2022 1817 Gross per 24 hour  Intake 120 ml  Output --  Net 120 ml    VASC L AKA: bandaged, no active bleeding    Laboratory   CBC    Latest Ref Rng & Units 06/10/2022    5:17 AM 06/09/2022    5:00 AM 06/08/2022    8:15 AM  CBC  WBC 4.0 - 10.5 K/uL 17.6  17.9  9.9   Hemoglobin 13.0 - 17.0 g/dL 9.1  9.6  11.3   Hematocrit 39.0 - 52.0 % 28.5  30.2  36.1   Platelets 150 - 400 K/uL 326  349  323     BMET    Component Value Date/Time   NA 136 06/09/2022 0500   NA 140 12/11/2017 0000   K 3.9 06/09/2022 0500   CL 100 06/09/2022 0500   CO2 28 06/09/2022 0500   GLUCOSE 128 (H) 06/09/2022 0500   BUN 24 (H) 06/09/2022 0500   BUN 16 12/11/2017 0000   CREATININE 2.85 (H) 06/09/2022 0500   CALCIUM 8.5 (L) 06/09/2022 0500   CALCIUM 8.4 (L) 10/05/2017 0615   GFRNONAA 23 (L) 06/09/2022 0500   GFRAA 21 (L) 12/04/2019 2027     Adele Barthel, MD, FACS, FSVS Covering for [facility]: [phone]  06/10/2022, 9:45 AM

## 2022-06-10 NOTE — TOC Initial Note (Signed)
Transition of Care [facility]) - Initial/Assessment Note    Patient Details  Name: Ernest [name] MRN: [id number] Date of Birth: [birth year]  Transition of Care [facility]) CM/SW Contact:    Valente David, RN Phone Number: 06/10/2022, 12:08 PM  Clinical Narrative:                  Patient admitted from [facility], left AKA performed on 8/25.  Per chart, plan to discharge back to [facility].  Spoke with daughter [name] to discuss discharge planning.  She was unhappy with care that was received at [facility], feels this lead to the need for surgery.  Would like him to be placed at a different facility.  Provided list of facilities in the area, encouraged to review as soon as possible and notify [facility] team of choices.  Her alternate choice would be to have patient relocate to live with her in [place].  She inquires about medicare paying for transport from [facility] to [place], [place].  Advised if this is available, there will likely be an out of pocket expense.  She will call insurance company to confirm it this is an option.  TOC team will continue to follow.  Expected Discharge Plan: Skilled Nursing Facility Barriers to Discharge: Continued Medical Work up   Patient Goals and CMS Choice Patient states their goals for this hospitalization and ongoing recovery are:: SNF CMS Medicare.gov Compare Post Acute Care list provided to:: Patient Represenative (must comment) (Daughter, Water quality scientist) Choice offered to / list presented to : [place] / Guardian  Expected Discharge Plan and Services Expected Discharge Plan: [place] Acute Care Choice: [place] Living arrangements for the past 2 months: [place]                                      Prior Living Arrangements/Services Living arrangements for the past 2 months: [place] Lives with:: Facility Resident Patient language and need for interpreter reviewed:: Yes Do you feel safe going back to  the place where you live?: No   Family would like patient discharged to different faciilty  Need for Family Participation in Patient Care: Yes (Comment) Care giver support system in place?: Yes (comment)   Criminal Activity/Legal Involvement Pertinent to Current Situation/Hospitalization: No - Comment as needed  Activities of Daily Living Home Assistive Devices/Equipment: Other (Comment) ADL Screening (condition at time of admission) Patient's cognitive ability adequate to safely complete daily activities?: Yes Is the patient deaf or have difficulty hearing?: Yes Does the patient have difficulty seeing, even when wearing glasses/contacts?: No Does the patient have difficulty concentrating, remembering, or making decisions?: Yes Patient able to express need for assistance with ADLs?: Yes Does the patient have difficulty dressing or bathing?: Yes Independently performs ADLs?: No Does the patient have difficulty walking or climbing stairs?: Yes Weakness of Legs: Right Weakness of Arms/Hands: Both  Permission Sought/Granted Permission sought to share information with : Facility Art therapist granted to share information with : Yes, Verbal Permission Granted  Share Information with NAME: [facility]           Emotional Assessment           Psych Involvement: No (comment)  Admission diagnosis:  Dry gangrene [facility]) [I96] Patient Active Problem List   Diagnosis Date Noted   Protein-calorie malnutrition, severe 06/05/2022   Adjustment  reaction with aggression 06/05/2022   Impaired fasting glucose 06/03/2022   Hypokalemia 06/03/2022   Dry gangrene ([place]) 06/02/2022   Chronic respiratory failure with hypoxia ([place]) 10/02/2021   Slurred speech 10/02/2021   Osteomyelitis of right foot ([place])    Acquired absence of right leg above knee ([place]) 07/21/2021   Pressure injury of skin 07/19/2021   Pressure ulcer 07/16/2021   Hypotension    Wet gangrene ([place])  07/10/2021   S/P AKA (above knee amputation), right ([place]) 07/10/2021   Atherosclerosis of native arteries of the extremities with gangrene ([place]) 07/08/2021   Sepsis ([place]) 06/27/2021   Bacterial infection, unspecified 03/31/2021   Contact with and (suspected) exposure to covid-19 03/13/2021   COVID-19 03/07/2021   Disorder of phosphorus metabolism, unspecified 02/20/2021   Hypercalcemia 12/08/2020   Coronary artery disease involving native coronary artery of native heart without angina pectoris 11/18/2020   Diarrhea, unspecified 11/18/2020   Fever, unspecified 11/18/2020   Other fluid overload 11/18/2020   Pain, unspecified 11/18/2020   Pleural effusion on left 09/30/2019   Acute respiratory failure with hypoxia (HCC) 09/30/2019   Pleural effusion 09/30/2019   Chest pain 12/02/2018   Vascular dementia ([place]) 10/10/2017   Atrial flutter ([place]) 10/10/2017   Altered mental status, unspecified 10/09/2017   Other malaise 10/09/2017   S/P CABG x 4 10/04/2017   Cerebral embolism with cerebral infarction 09/20/2017   NSVT (nonsustained ventricular tachycardia) ([place])    Dyspnea 09/08/2017   Non-ST elevation MI (NSTEMI) ([place])    Acute on chronic combined systolic and diastolic CHF (congestive heart failure) ([place])    End-stage renal disease on hemodialysis ([place])    Left leg weakness    TIA (transient ischemic attack) 10/27/2016   Essential hypertension 09/18/2015   Dependence on renal dialysis ([place]) 08/09/2015   History of CVA (cerebrovascular accident) 08/07/2015   Seizure, late effect of stroke ([place]) 08/07/2015   Diabetic peripheral neuropathy ([place]) 08/07/2015   Unspecified protein-calorie malnutrition ([place]) 05/27/2015   Seizure ([place]) 04/29/2015   Coagulation defect, unspecified ([place]) 07/16/2014   Type 2 diabetes mellitus with diabetic peripheral angiopathy without gangrene ([place]) 07/01/2014   Pruritus, unspecified 05/26/2014   Elevated prostate specific antigen (PSA) 08/03/2013    Encounter for immunization 07/10/2013   Obstructive sleep apnea 06/16/2013   Hyperlipidemia associated with type 2 diabetes mellitus ([place])    ESRD (end stage renal disease) ([place])    Erectile dysfunction associated with type 2 diabetes mellitus ([place])    Hypertension due to end stage renal disease caused by type 2 diabetes mellitus, on dialysis ([place])    COPD (chronic obstructive pulmonary disease) ([place])    Chronic HFrEF (heart failure with reduced ejection fraction) (HCC)    Anemia of chronic disease 12/27/2011   Long term (current) use of insulin ([place]) 11/03/2009   Gout, unspecified 02/28/2009   Iron deficiency anemia, unspecified 10/30/2008   Secondary hyperparathyroidism of renal origin ([place]) 10/30/2008   PCP:  Brayton Mars, MD Pharmacy:   [place], [place] [facility] [place] [address] [place] [address] Phone: [phone] Fax: [phone]     Social Determinants of Health (SDOH) Interventions    Readmission Risk Interventions    06/10/2022   12:03 PM 06/28/2021   10:38 AM  Readmission Risk Prevention Plan  Transportation Screening Complete Complete  Medication Review (RN Care Manager) Complete Complete  PCP or Specialist appointment within 3-5 days of discharge Complete Complete  HRI or Home Care Consult Complete Complete  SW Recovery Care/Counseling Consult Complete Complete  Palliative Care Screening Not Applicable Not Applicable  Skilled Nursing Facility Complete Complete

## 2022-06-10 NOTE — Progress Notes (Signed)
[facility]  ROUNDING NOTE   Subjective:   Ernest [name] is a 76 year old African-American male with past medical conditions including CAD, COPD, CHF, diabetes, stroke with late seizures, hypertension, and end-stage renal disease on hemodialysis.  Patient presents to the emergency department from [facility] with concerns of left foot wound and has been admitted for Dry gangrene [facility]) [I96]  Patient is known to our practice from previous admissions and receives outpatient dialysis treatments at [facility] on a MWF schedule, supervised by [facility] physicians.    Patient was seen today in his room Patient is lying comfortably in the bed Patient offers no new specific physical complaints   Objective:  Vital signs in last 24 hours:  Temp:  [97.9 F (36.6 C)-98.2 F (36.8 C)] 98.2 F (36.8 C) (08/27 0734) Pulse Rate:  [48-109] 109 (08/27 0734) Resp:  [15-18] 17 (08/27 0734) BP: (118-137)/(59-81) 118/59 (08/27 0734) SpO2:  [91 %-99 %] 91 % (08/27 0734)  Weight change:  Filed Weights   06/02/22 2026 06/06/22 0809 06/06/22 1230  Weight: 57.9 kg 56.9 kg 55.9 kg    Intake/Output: I/O last 3 completed shifts: In: 120 [P.O.:120] Out: 1000 [Other:1000]   Intake/Output this shift:  No intake/output data recorded.  Physical Exam: General: NAD  Head: Normocephalic, atraumatic. Moist oral mucosal membranes  Eyes: Anicteric  Lungs:  Clear to auscultation, normal effort, room air  Heart: Regular rate and rhythm  Abdomen:  Soft, nontender, nondistended  Extremities: No peripheral edema.  Neurologic: Nonfocal, right and left AKA  Skin: Surgical dressing left AKA  Access: Right thigh PermCath    Basic Metabolic Panel: Recent Labs  Lab 06/04/22 0540 06/08/22 0815 06/09/22 0500  NA 135 136 136  K 3.2* 3.4* 3.9  CL 98 99 100  CO2 28 28 28   GLUCOSE 112* 135* 128*  BUN 39* 42* 24*  CREATININE 4.45* 4.57* 2.85*  CALCIUM 8.3* 9.0 8.5*  MG  --   --  2.0   PHOS 2.8  --   --     Liver Function Tests: Recent Labs  Lab 06/04/22 0540  ALBUMIN 2.3*   No results for input(s): "LIPASE", "AMYLASE" in the last 168 hours. No results for input(s): "AMMONIA" in the last 168 hours.  CBC: Recent Labs  Lab 06/04/22 0540 06/08/22 0815 06/09/22 0500 06/10/22 0517  WBC 8.8 9.9 17.9* 17.6*  HGB 11.1* 11.3* 9.6* 9.1*  HCT 35.5* 36.1* 30.2* 28.5*  MCV 79.2* 78.8* 79.7* 78.3*  PLT 306 323 349 326    Cardiac Enzymes: No results for input(s): "CKTOTAL", "CKMB", "CKMBINDEX", "TROPONINI" in the last 168 hours.  BNP: Invalid input(s): "POCBNP"  CBG: Recent Labs  Lab 06/03/22 1644 06/03/22 2052 06/04/22 2209 06/06/22 1423 06/08/22 1028  GLUCAP 153* 157* 148* 113* 129*    Microbiology: Results for orders placed or performed during the hospital encounter of 06/02/22  Blood culture (routine x 2)     Status: None   Collection Time: 06/02/22  4:24 PM   Specimen: BLOOD  Result Value Ref Range Status   Specimen Description BLOOD LEFT ANTECUBITAL  Final   Special Requests   Final    BOTTLES DRAWN AEROBIC AND ANAEROBIC Blood Culture results may not be optimal due to an inadequate volume of blood received in culture bottles   Culture   Final    NO GROWTH 5 DAYS Performed at [facility], [address], [place], [place] [address]    Report Status 06/07/2022 FINAL  Final  Blood culture (routine x 2)     Status: None   Collection Time: 06/02/22  8:45 PM   Specimen: BLOOD LEFT HAND  Result Value Ref Range Status   Specimen Description BLOOD LEFT HAND  Final   Special Requests   Final    BOTTLES DRAWN AEROBIC AND ANAEROBIC Blood Culture adequate volume   Culture   Final    NO GROWTH 5 DAYS Performed at [facility], [address], [place], [place] [address]    Report Status 06/07/2022 FINAL  Final    Coagulation Studies: No results for input(s): "LABPROT", "INR" in the last 72 hours.  Urinalysis: No results for  input(s): "COLORURINE", "LABSPEC", "PHURINE", "GLUCOSEU", "HGBUR", "BILIRUBINUR", "KETONESUR", "PROTEINUR", "UROBILINOGEN", "NITRITE", "LEUKOCYTESUR" in the last 72 hours.  Invalid input(s): "APPERANCEUR"    Imaging: No results found.   Medications:    levETIRAcetam 500 mg (06/10/22 0802)   levETIRAcetam 500 mg (06/08/22 2302)    (feeding supplement) PROSource Plus  30 mL Oral TID BM   apixaban  2.5 mg Oral BID   vitamin C  500 mg Oral BID   atorvastatin  40 mg Oral Daily   Chlorhexidine Gluconate Cloth  6 each Topical Daily   Chlorhexidine Gluconate Cloth  6 each Topical Once   DULoxetine  40 mg Oral BID   feeding supplement (NEPRO CARB STEADY)  237 mL Oral TID BM   midodrine  5 mg Oral TID WC   multivitamin  1 tablet Oral QHS   pantoprazole  40 mg Oral Daily   polyethylene glycol  17 g Oral Daily   senna-docusate  1 tablet Oral QHS   sevelamer carbonate  800 mg Oral TID WC   zinc sulfate  220 mg Oral Daily   acetaminophen, haloperidol lactate, HYDROmorphone (DILAUDID) injection, ondansetron **OR** ondansetron (ZOFRAN) IV, ondansetron (ZOFRAN) IV, oxyCODONE-acetaminophen  Assessment/ Plan:  [name] is a 76 y.o.  male  with past medical conditions including CAD, COPD, CHF, diabetes, stroke with late seizures, hypertension, and end-stage renal disease on hemodialysis.  Patient presents to the emergency department from [facility] with concerns of left foot wound and has been admitted for 06/02/2022  3:25 PM   [facility]/MWF/right thigh PermCath  End-stage renal disease on hemodialysis.   Patient received his last dialysis -Friday Patient is a Monday Wednesday Friday schedule. No need for renal placement therapy today We will monitor discharge planning to assess changes in outpatient needs.  2. Anemia of chronic kidney disease Lab Results  Component Value Date   HGB 9.1 (L) 06/10/2022    Patient receives [place] outpatient.  Hemoglobin at  goal.  3. Secondary Hyperparathyroidism: with outpatient labs: PTH 534, phosphorus 4.5, calcium 8.0 on 05/21/2022.   Lab Results  Component Value Date   PTH 63 10/05/2017   PTH Comment 10/05/2017   CALCIUM 8.5 (L) 06/09/2022   CAION 1.05 (L) 07/12/2020   PHOS 2.8 06/04/2022  Calcium and phosphorus within acceptable range.  We will continue to monitor. Continue sevelamer with meals.  4.  Hypertension with chronic kidney disease.  Home regimen includes amlodipine. Blood pressure soft this morning, 85/58.  Amlodipine stopped, patient placed on midodrine 5 mg 3 times daily. Blood pressure currently 125/73.  5.  Dry gangrene,  on left foot.  Patient was admitted with dry gangrene.  Patient is now s/p AKA Vascular surgery and primary team are following  7.Hypokalemia Now better    LOS: 8 Aneshia Jacquet s  Mateusz Neilan 8/27/20238:49 AM

## 2022-06-11 ENCOUNTER — Encounter: Payer: Self-pay | Admitting: Vascular Surgery

## 2022-06-11 DIAGNOSIS — Z992 Dependence on renal dialysis: Secondary | ICD-10-CM | POA: Diagnosis not present

## 2022-06-11 DIAGNOSIS — F4329 Adjustment disorder with other symptoms: Secondary | ICD-10-CM | POA: Diagnosis not present

## 2022-06-11 DIAGNOSIS — I96 Gangrene, not elsewhere classified: Secondary | ICD-10-CM | POA: Diagnosis not present

## 2022-06-11 DIAGNOSIS — N186 End stage renal disease: Secondary | ICD-10-CM | POA: Diagnosis not present

## 2022-06-11 LAB — RENAL FUNCTION PANEL
Albumin: 2.1 g/dL — ABNORMAL LOW (ref 3.5–5.0)
Anion gap: 10 (ref 5–15)
BUN: 49 mg/dL — ABNORMAL HIGH (ref 8–23)
CO2: 27 mmol/L (ref 22–32)
Calcium: 8.7 mg/dL — ABNORMAL LOW (ref 8.9–10.3)
Chloride: 95 mmol/L — ABNORMAL LOW (ref 98–111)
Creatinine, Ser: 4.33 mg/dL — ABNORMAL HIGH (ref 0.61–1.24)
GFR, Estimated: 14 mL/min — ABNORMAL LOW (ref >60)
Glucose, Bld: 187 mg/dL — ABNORMAL HIGH (ref 70–99)
Phosphorus: 3.5 mg/dL (ref 2.5–4.6)
Potassium: 4.6 mmol/L (ref 3.5–5.1)
Sodium: 132 mmol/L — ABNORMAL LOW (ref 135–145)

## 2022-06-11 LAB — CBC
HCT: 27.8 % — ABNORMAL LOW (ref 39.0–52.0)
Hemoglobin: 8.9 g/dL — ABNORMAL LOW (ref 13.0–17.0)
MCH: 25.1 pg — ABNORMAL LOW (ref 26.0–34.0)
MCHC: 32 g/dL (ref 30.0–36.0)
MCV: 78.3 fL — ABNORMAL LOW (ref 80.0–100.0)
Platelets: 281 10*3/uL (ref 150–400)
RBC: 3.55 MIL/uL — ABNORMAL LOW (ref 4.22–5.81)
RDW: 20.1 % — ABNORMAL HIGH (ref 11.5–15.5)
WBC: 15.5 10*3/uL — ABNORMAL HIGH (ref 4.0–10.5)
nRBC: 0 % (ref 0.0–0.2)

## 2022-06-11 MED ORDER — MIDODRINE HCL 5 MG PO TABS
10.0000 mg | ORAL_TABLET | Freq: Once | ORAL | Status: AC
  Administered 2022-06-11: 10 mg via ORAL
  Filled 2022-06-11: qty 2

## 2022-06-11 MED ORDER — ALBUMIN HUMAN 25 % IV SOLN
12.5000 g | Freq: Once | INTRAVENOUS | Status: AC
Start: 2022-06-11 — End: 2022-06-12
  Administered 2022-06-11: 12.5 g via INTRAVENOUS
  Filled 2022-06-11: qty 50

## 2022-06-11 MED ORDER — LACTATED RINGERS IV BOLUS
250.0000 mL | Freq: Once | INTRAVENOUS | Status: AC
  Administered 2022-06-11: 250 mL via INTRAVENOUS

## 2022-06-11 NOTE — Progress Notes (Signed)
  Progress Note   Patient: Ernest [name] ZOX:[id number] DOB: [birth year] DOA: 06/02/2022     9 DOS: the patient was seen and examined on 06/11/2022   Brief hospital course: 76 year old man with chronic systolic congestive heart failure, COPD, end-stage renal disease, hepatitis C, hypertension, stroke.  Patient brought in by family secondary to gangrene left foot.  Started empirically on antibiotics.  On 06/04/2022 patient refused dialysis and refused medications.  Patient declined amputation at this time.  Appreciate palliative care consultation and patient was made a DO NOT RESUSCITATE.  8/23.  Patient was agreeable and dialyzed today. 8/25.  Left AKA  Assessment and Plan: Dry gangrene of the left foot.  Status post left AKA. Peripheral vascular disease. Right AKA. Condition stable.  Antibiotics completed. Leukocytosis also getting better.  Currently pending insurance approval for nursing home placement.   End-stage renal disease on dialysis Secondary hyperparathyroidism. Hypokalemia. Hypotension. Continue scheduled dialysis.   Vascular dementia. History of stroke. 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 Stable.   Chronic systolic congestive heart failure. Stable.   Stage II gluteal cleft pressure ulcer. Follow.   Severe protein calorie malnutrition        Subjective: No change inpatient status, pending nursing home.  Physical Exam: Vitals:   06/11/22 1155 06/11/22 1200 06/11/22 1230 06/11/22 1245  BP: 109/72 112/72 110/69 108/82  Pulse:  (!) 110 (!) 111   Resp: 14 19 20    Temp:      TempSrc:      SpO2:  100% 100%   Weight:      Height:       General exam: Appears calm and comfortable  Respiratory system: Clear to auscultation. Respiratory effort normal. Cardiovascular system: S1 & S2 heard, RRR. No JVD, murmurs, rubs, gallops or clicks. No pedal edema. Gastrointestinal system: Abdomen is nondistended, soft and nontender. No organomegaly or masses felt. Normal  bowel sounds heard. Central nervous system: Alert and oriented x2. No focal neurological deficits. Extremities: Bilateral AKA. Skin: No rashes, lesions or ulcers   Data Reviewed:  There are no new results to review at this time.  Family Communication: None  Disposition: Status is: Inpatient Remains inpatient appropriate because: Unsafe discharge  Planned Discharge Destination: Skilled nursing facility    Time spent: 22 minutes  Author: Sharen Hones, MD 06/11/2022 1:07 PM  For on call review [email or web address].

## 2022-06-11 NOTE — Progress Notes (Deleted)
   06/11/22 2012  Assess: MEWS Score  Temp 97.6 F (36.4 C)  BP (!) 89/69  MAP (mmHg) 78  Pulse Rate (!) 117  Resp 16  SpO2 94 %  Assess: MEWS Score  MEWS Temp 0  MEWS Systolic 1  MEWS Pulse 2  MEWS RR 0  MEWS LOC 0  MEWS Score 3  MEWS Score Color Yellow  Assess: if the MEWS score is Yellow or Red  Were vital signs taken at a resting state? Yes  Focused Assessment No change from prior assessment  Does the patient meet 2 or more of the SIRS criteria? Yes  Does the patient have a confirmed or suspected source of infection? No  MEWS guidelines implemented *See Row Information* Yes  Treat  MEWS Interventions Escalated (See documentation below)  Take Vital Signs  Increase Vital Sign Frequency  Yellow: Q 2hr X 2 then Q 4hr X 2, if remains yellow, continue Q 4hrs  Escalate  MEWS: Escalate Yellow: discuss with charge nurse/RN and consider discussing with provider and RRT  Notify: Charge Nurse/RN  Name of Charge Nurse/RN Notified Stanton Kidney, RN  Date Charge Nurse/RN Notified 06/11/22  Time Charge Nurse/RN Notified 2020  Notify: Provider  Date Provider Notified 06/11/22  Time Provider Notified 2021  Method of Notification Page  Notification Reason Other (Comment) (Yellow MEWS)  Provider response At bedside  Date of Provider Response 06/11/22  Time of Provider Response 2025  Document  Patient Outcome Other (Comment) (Continue to monitor)  Progress note created (see row info) Yes  Assess: SIRS CRITERIA  SIRS Temperature  0  SIRS Pulse 1  SIRS Respirations  0  SIRS WBC 1  SIRS Score Sum  2

## 2022-06-11 NOTE — TOC Progression Note (Signed)
Transition of Care [facility]) - Progression Note    Patient Details  Name: Ernest [name] MRN: [id number] Date of Birth: 07/05/1946  Transition of Care [facility]) CM/SW Contact  Laurena Slimmer, RN Phone Number: 06/11/2022, 1:34 PM  Clinical Narrative:    Damaris Schooner with Neoma Laming in admission from [facility]. Patient is LTC however if patient requires therapy upon return to the facility  they will require reimbursement from his secondary payor . That will require a new authorization from the [facility]. Awaiting return call from Neoma Laming, and therapy recommendations.    Expected Discharge Plan: [place] Barriers to Discharge: Continued Medical Work up  Expected Discharge Plan and Services Expected Discharge Plan: [place] Choice: [place] arrangements for the past 2 months: [place]                                       Social Determinants of Health (SDOH) Interventions    Readmission Risk Interventions    06/10/2022   12:03 PM 06/28/2021   10:38 AM  Readmission Risk Prevention Plan  Transportation Screening Complete Complete  Medication Review (RN Care Manager) Complete Complete  PCP or Specialist appointment within 3-5 days of discharge Complete Complete  HRI or Home Care Consult Complete Complete  SW Recovery Care/Counseling Consult Complete Complete  Palliative Care Screening Not Applicable Not Applicable  Skilled Nursing Facility Complete Complete

## 2022-06-11 NOTE — Progress Notes (Signed)
Pt 3.5 hour HD tx complete. Alert, oriented to self per baseline, vss and no c/o. Report to primary RN. Start: 0935 End: 1308 1555ml fluid removed  65L BVP No meds w/ HD UTA bed weight d/t padding on bed and inaccurate bed weight on scale.

## 2022-06-11 NOTE — Progress Notes (Signed)
   06/11/22 2012  Assess: MEWS Score  Temp 97.6 F (36.4 C)  BP (!) 89/69  MAP (mmHg) 78  Pulse Rate (!) 117  Resp 16  SpO2 94 %  Assess: MEWS Score  MEWS Temp 0  MEWS Systolic 1  MEWS Pulse 2  MEWS RR 0  MEWS LOC 0  MEWS Score 3  MEWS Score Color Yellow  Assess: if the MEWS score is Yellow or Red  Were vital signs taken at a resting state? Yes  Focused Assessment No change from prior assessment  Does the patient meet 2 or more of the SIRS criteria? Yes  Does the patient have a confirmed or suspected source of infection? No  MEWS guidelines implemented *See Row Information* Yes  Treat  MEWS Interventions Escalated (See documentation below)  Take Vital Signs  Increase Vital Sign Frequency  Yellow: Q 2hr X 2 then Q 4hr X 2, if remains yellow, continue Q 4hrs  Escalate  MEWS: Escalate Yellow: discuss with charge nurse/RN and consider discussing with provider and RRT  Notify: Charge Nurse/RN  Name of Charge Nurse/RN Notified Stanton Kidney, RN  Date Charge Nurse/RN Notified 06/11/22  Time Charge Nurse/RN Notified 2020  Notify: Provider  Provider Name/Title K. Foust, NP  Date Provider Notified 06/11/22  Time Provider Notified 2021  Method of Notification Page  Notification Reason Other (Comment) (Yellow MEWS)  Provider response At bedside  Date of Provider Response 06/11/22  Time of Provider Response 2025  Document  Patient Outcome Other (Comment) (Continue to monitor)  Progress note created (see row info) Yes  Assess: SIRS CRITERIA  SIRS Temperature  0  SIRS Pulse 1  SIRS Respirations  0  SIRS WBC 1  SIRS Score Sum  2

## 2022-06-11 NOTE — Progress Notes (Signed)
       CROSS COVER NOTE  NAME: STALIN GRUENBERG MRN: [id number] DOB : Aug 25, 1946    Date of Service   06/11/2022   HPI/Events of Note   Notified of BP 89/69 after 250 mL fluid bolus. Patient is mentating well and asymptomatic.  Interventions   Plan: Midodrine x1 Albumin 12.5g     This document was prepared using Dragon voice recognition software and may include unintentional dictation errors.  Neomia Glass DNP, MHA, FNP-BC Nurse Practitioner [facility] Pager [phone]

## 2022-06-11 NOTE — Progress Notes (Signed)
Patient refused to take his Renvela and Midodrine this evening. Patient stated he was tired and he did not want to eat dinner either.Made MD aware.

## 2022-06-11 NOTE — Progress Notes (Signed)
Catheter lines reversed for HD d/t high ap and vp. 2L [place] applied d/t 88 02 sats on RA. Pt alert, oriented to self at baseline, vss, Breeze, NP and Holley Raring, MD present.

## 2022-06-11 NOTE — Anesthesia Postprocedure Evaluation (Signed)
Anesthesia Post Note  Patient: Ernest [name]  Procedure(s) Performed: AMPUTATION ABOVE KNEE (Left: Knee)  Patient location during evaluation: PACU Anesthesia Type: General Level of consciousness: awake and alert Pain management: pain level controlled Vital Signs Assessment: post-procedure vital signs reviewed and stable Respiratory status: spontaneous breathing, nonlabored ventilation, respiratory function stable and patient connected to nasal cannula oxygen Cardiovascular status: blood pressure returned to baseline and stable Postop Assessment: no apparent nausea or vomiting Anesthetic complications: no   No notable events documented.   Last Vitals:  Vitals:   06/11/22 0502 06/11/22 0753  BP: 126/81 126/75  Pulse: 96 (!) 102  Resp: 18 16  Temp: (!) 36.3 C 36.5 C  SpO2: 96% 94%    Last Pain:  Vitals:   06/11/22 0630  TempSrc:   PainSc: [place]

## 2022-06-11 NOTE — Progress Notes (Signed)
PT Cancellation Note  Patient Details Name: Ernest [name] MRN: [id number] DOB: November 17, 1945   Cancelled Treatment:    Reason Eval/Treat Not Completed: Patient at procedure or test/unavailable (Consult received and chart reviewed.  Patient currently off unit for dialysis.  Will re-attempt at later time/date as medically appropriate and available.)   Feliciano Wynter H. Owens Shark, PT, DPT, NCS 06/11/22, 10:27 AM [phone]

## 2022-06-11 NOTE — Progress Notes (Signed)
Pre HD RN assessment 

## 2022-06-11 NOTE — Progress Notes (Signed)
OT Cancellation Note  Patient Details Name: Ernest [name] MRN: [id number] DOB: 1946/03/06   Cancelled Treatment:    Reason Eval/Treat Not Completed: Patient at procedure or test/ unavailable. Order received, chart reviewed. Pt out of room for dialysis. Will re-attempt OT evaluation at later date/time as pt is available and medically appropriate.  Ardeth Perfect., MPH, MS, OTR/L ascom [phone] 06/11/22, 9:43 AM

## 2022-06-11 NOTE — Progress Notes (Signed)
[facility]  ROUNDING NOTE   Subjective:   Ernest [name] is a 76 year old African-American male with past medical conditions including CAD, COPD, CHF, diabetes, stroke with late seizures, hypertension, and end-stage renal disease on hemodialysis.  Patient presents to the emergency department from [facility] with concerns of left foot wound and has been admitted for Dry gangrene [facility]) [I96]  Patient is known to our practice from previous admissions and receives outpatient dialysis treatments at [facility] on a MWF schedule, supervised by [facility] physicians.    Patient seen and evaluated during dialysis   HEMODIALYSIS FLOWSHEET:  Blood Flow Rate (mL/min): 300 mL/min Arterial Pressure (mmHg): -170 mmHg Venous Pressure (mmHg): 170 mmHg TMP (mmHg): 8 mmHg Ultrafiltration Rate (mL/min): 659 mL/min Dialysate Flow Rate (mL/min): 300 ml/min Dialysis Fluid Bolus: Normal Saline Bolus Amount (mL): 300 mL  Resting well, tolerating treatment No complaints at this time   Objective:  Vital signs in last 24 hours:  Temp:  [97.3 F (36.3 C)-98 F (36.7 C)] 98 F (36.7 C) (08/28 0916) Pulse Rate:  [56-108] 102 (08/28 1100) Resp:  [13-21] 16 (08/28 1100) BP: (113-131)/(70-94) 118/82 (08/28 1125) SpO2:  [92 %-100 %] 100 % (08/28 1100)  Weight change:  Filed Weights   06/02/22 2026 06/06/22 0809 06/06/22 1230  Weight: 57.9 kg 56.9 kg 55.9 kg    Intake/Output: I/O last 3 completed shifts: In: 970 [P.O.:240; NG/GT:230; IV Piggyback:500] Out: -    Intake/Output this shift:  No intake/output data recorded.  Physical Exam: General: NAD  Head: Normocephalic, atraumatic. Moist oral mucosal membranes  Eyes: Anicteric  Lungs:  Clear to auscultation, normal effort, room air  Heart: Irregular rhythm  Abdomen:  Soft, nontender, nondistended  Extremities: No peripheral edema.  Neurologic: Nonfocal, right and left AKA  Skin: Surgical dressing left AKA  Access:  Right thigh PermCath    Basic Metabolic Panel: Recent Labs  Lab 06/08/22 0815 06/09/22 0500 06/11/22 0924  NA 136 136 132*  K 3.4* 3.9 4.6  CL 99 100 95*  CO2 28 28 27   GLUCOSE 135* 128* 187*  BUN 42* 24* 49*  CREATININE 4.57* 2.85* 4.33*  CALCIUM 9.0 8.5* 8.7*  MG  --  2.0  --   PHOS  --   --  3.5     Liver Function Tests: Recent Labs  Lab 06/11/22 0924  ALBUMIN 2.1*    No results for input(s): "LIPASE", "AMYLASE" in the last 168 hours. No results for input(s): "AMMONIA" in the last 168 hours.  CBC: Recent Labs  Lab 06/08/22 0815 06/09/22 0500 06/10/22 0517 06/11/22 0416  WBC 9.9 17.9* 17.6* 15.5*  HGB 11.3* 9.6* 9.1* 8.9*  HCT 36.1* 30.2* 28.5* 27.8*  MCV 78.8* 79.7* 78.3* 78.3*  PLT 323 349 326 281     Cardiac Enzymes: No results for input(s): "CKTOTAL", "CKMB", "CKMBINDEX", "TROPONINI" in the last 168 hours.  BNP: Invalid input(s): "POCBNP"  CBG: Recent Labs  Lab 06/04/22 2209 06/06/22 1423 06/08/22 1028  GLUCAP 148* 113* 129*     Microbiology: Results for orders placed or performed during the hospital encounter of 06/02/22  Blood culture (routine x 2)     Status: None   Collection Time: 06/02/22  4:24 PM   Specimen: BLOOD  Result Value Ref Range Status   Specimen Description BLOOD LEFT ANTECUBITAL  Final   Special Requests   Final    BOTTLES DRAWN AEROBIC AND ANAEROBIC Blood Culture results may not be optimal due to an inadequate  volume of blood received in culture bottles   Culture   Final    NO GROWTH 5 DAYS Performed at [facility], [address] [place], [place] [address]    Report Status 06/07/2022 FINAL  Final  Blood culture (routine x 2)     Status: None   Collection Time: 06/02/22  8:45 PM   Specimen: BLOOD LEFT HAND  Result Value Ref Range Status   Specimen Description BLOOD LEFT HAND  Final   Special Requests   Final    BOTTLES DRAWN AEROBIC AND ANAEROBIC Blood Culture adequate volume   Culture   Final     NO GROWTH 5 DAYS Performed at [facility], [address] [place], [place] [address]    Report Status 06/07/2022 FINAL  Final    Coagulation Studies: No results for input(s): "LABPROT", "INR" in the last 72 hours.  Urinalysis: No results for input(s): "COLORURINE", "LABSPEC", "PHURINE", "GLUCOSEU", "HGBUR", "BILIRUBINUR", "KETONESUR", "PROTEINUR", "UROBILINOGEN", "NITRITE", "LEUKOCYTESUR" in the last 72 hours.  Invalid input(s): "APPERANCEUR"    Imaging: No results found.   Medications:    levETIRAcetam 400 mL/hr at 06/11/22 8676   levETIRAcetam 500 mg (06/08/22 2302)    (feeding supplement) PROSource Plus  30 mL Oral TID BM   apixaban  2.5 mg Oral BID   vitamin C  500 mg Oral BID   atorvastatin  40 mg Oral Daily   Chlorhexidine Gluconate Cloth  6 each Topical Daily   Chlorhexidine Gluconate Cloth  6 each Topical Once   DULoxetine  40 mg Oral BID   feeding supplement (NEPRO CARB STEADY)  237 mL Oral TID BM   midodrine  5 mg Oral TID WC   multivitamin  1 tablet Oral QHS   pantoprazole  40 mg Oral Daily   polyethylene glycol  17 g Oral Daily   senna-docusate  1 tablet Oral QHS   sevelamer carbonate  800 mg Oral TID WC   zinc sulfate  220 mg Oral Daily   acetaminophen, haloperidol lactate, HYDROmorphone (DILAUDID) injection, ondansetron **OR** ondansetron (ZOFRAN) IV, ondansetron (ZOFRAN) IV, oxyCODONE-acetaminophen  Assessment/ Plan:  [name] is a 76 y.o.  male  with past medical conditions including CAD, COPD, CHF, diabetes, stroke with late seizures, hypertension, and end-stage renal disease on hemodialysis.  Patient presents to the emergency department from [facility] with concerns of left foot wound and has been admitted for 06/02/2022  3:25 PM   [facility]/MWF/right thigh PermCath  End-stage renal disease on hemodialysis.   Receiving dialysis today, UF goal 1.5L as tolerated. Next treatment scheduled for Wednesday. Will  monitor discharge plan to determine changes in outpatient clinic.  2. Anemia of chronic kidney disease Lab Results  Component Value Date   HGB 8.9 (L) 06/11/2022    Patient receives [place] outpatient.  Hemoglobin borderline.  We will continue to monitor.  3. Secondary Hyperparathyroidism: with outpatient labs: PTH 534, phosphorus 4.5, calcium 8.0 on 05/21/2022.   Lab Results  Component Value Date   PTH 63 10/05/2017   PTH Comment 10/05/2017   CALCIUM 8.7 (L) 06/11/2022   CAION 1.05 (L) 07/12/2020   PHOS 3.5 06/11/2022  Calcium and phosphorus remain at goal.   Continue sevelamer with meals.  4.  Hypertension with chronic kidney disease.  Home regimen includes amlodipine. Blood pressure soft this morning, 85/58.  Amlodipine stopped, patient placed on midodrine 5 mg 3 times daily. Blood pressure 112/72 during dialysis.  5.  Dry gangrene,  on left foot.  Patient was admitted with dry gangrene.  Patient is now s/p AKA Vascular surgery and primary team are following     LOS: 9 [place] 8/28/202311:52 AM

## 2022-06-11 NOTE — Progress Notes (Signed)
Post HD RN assessment 

## 2022-06-12 ENCOUNTER — Inpatient Hospital Stay: Payer: Medicare (Managed Care)

## 2022-06-12 DIAGNOSIS — I96 Gangrene, not elsewhere classified: Secondary | ICD-10-CM | POA: Diagnosis not present

## 2022-06-12 DIAGNOSIS — I9589 Other hypotension: Secondary | ICD-10-CM | POA: Diagnosis not present

## 2022-06-12 DIAGNOSIS — Z992 Dependence on renal dialysis: Secondary | ICD-10-CM | POA: Diagnosis not present

## 2022-06-12 DIAGNOSIS — N186 End stage renal disease: Secondary | ICD-10-CM | POA: Diagnosis not present

## 2022-06-12 DIAGNOSIS — F4329 Adjustment disorder with other symptoms: Secondary | ICD-10-CM | POA: Diagnosis not present

## 2022-06-12 LAB — BASIC METABOLIC PANEL
Anion gap: 8 (ref 5–15)
BUN: 33 mg/dL — ABNORMAL HIGH (ref 8–23)
CO2: 27 mmol/L (ref 22–32)
Calcium: 9.1 mg/dL (ref 8.9–10.3)
Chloride: 101 mmol/L (ref 98–111)
Creatinine, Ser: 3.19 mg/dL — ABNORMAL HIGH (ref 0.61–1.24)
GFR, Estimated: 20 mL/min — ABNORMAL LOW (ref >60)
Glucose, Bld: 120 mg/dL — ABNORMAL HIGH (ref 70–99)
Potassium: 4.4 mmol/L (ref 3.5–5.1)
Sodium: 136 mmol/L (ref 135–145)

## 2022-06-12 LAB — PROCALCITONIN: Procalcitonin: 0.8 ng/mL

## 2022-06-12 LAB — CBC
HCT: 25.4 % — ABNORMAL LOW (ref 39.0–52.0)
Hemoglobin: 8 g/dL — ABNORMAL LOW (ref 13.0–17.0)
MCH: 25.2 pg — ABNORMAL LOW (ref 26.0–34.0)
MCHC: 31.5 g/dL (ref 30.0–36.0)
MCV: 79.9 fL — ABNORMAL LOW (ref 80.0–100.0)
Platelets: 385 10*3/uL (ref 150–400)
RBC: 3.18 MIL/uL — ABNORMAL LOW (ref 4.22–5.81)
RDW: 20.3 % — ABNORMAL HIGH (ref 11.5–15.5)
WBC: 15.8 10*3/uL — ABNORMAL HIGH (ref 4.0–10.5)
nRBC: 0 % (ref 0.0–0.2)

## 2022-06-12 LAB — CBC WITH DIFFERENTIAL/PLATELET
Abs Immature Granulocytes: 0.09 10*3/uL — ABNORMAL HIGH (ref 0.00–0.07)
Basophils Absolute: 0 10*3/uL (ref 0.0–0.1)
Basophils Relative: 0 %
Eosinophils Absolute: 0 10*3/uL (ref 0.0–0.5)
Eosinophils Relative: 0 %
HCT: 22.3 % — ABNORMAL LOW (ref 39.0–52.0)
Hemoglobin: 7.1 g/dL — ABNORMAL LOW (ref 13.0–17.0)
Immature Granulocytes: 1 %
Lymphocytes Relative: 3 %
Lymphs Abs: 0.5 10*3/uL — ABNORMAL LOW (ref 0.7–4.0)
MCH: 25.7 pg — ABNORMAL LOW (ref 26.0–34.0)
MCHC: 31.8 g/dL (ref 30.0–36.0)
MCV: 80.8 fL (ref 80.0–100.0)
Monocytes Absolute: 0.8 10*3/uL (ref 0.1–1.0)
Monocytes Relative: 6 %
Neutro Abs: 13.2 10*3/uL — ABNORMAL HIGH (ref 1.7–7.7)
Neutrophils Relative %: 90 %
Platelets: 292 10*3/uL (ref 150–400)
RBC: 2.76 MIL/uL — ABNORMAL LOW (ref 4.22–5.81)
RDW: 19.9 % — ABNORMAL HIGH (ref 11.5–15.5)
WBC: 14.6 10*3/uL — ABNORMAL HIGH (ref 4.0–10.5)
nRBC: 0 % (ref 0.0–0.2)

## 2022-06-12 LAB — MRSA NEXT GEN BY PCR, NASAL: MRSA by PCR Next Gen: NOT DETECTED

## 2022-06-12 LAB — MAGNESIUM: Magnesium: 1.9 mg/dL (ref 1.7–2.4)

## 2022-06-12 LAB — SURGICAL PATHOLOGY

## 2022-06-12 LAB — LACTIC ACID, PLASMA
Lactic Acid, Venous: 1.3 mmol/L (ref 0.5–1.9)
Lactic Acid, Venous: 1.2 mmol/L (ref 0.5–1.9)

## 2022-06-12 LAB — PHOSPHORUS: Phosphorus: 2.9 mg/dL (ref 2.5–4.6)

## 2022-06-12 LAB — TROPONIN I (HIGH SENSITIVITY)
Troponin I (High Sensitivity): 577 ng/L (ref <18)
Troponin I (High Sensitivity): 1183 ng/L (ref <18)
Troponin I (High Sensitivity): 1505 ng/L (ref <18)

## 2022-06-12 LAB — GLUCOSE, CAPILLARY
Glucose-Capillary: 116 mg/dL — ABNORMAL HIGH (ref 70–99)
Glucose-Capillary: 146 mg/dL — ABNORMAL HIGH (ref 70–99)

## 2022-06-12 LAB — BRAIN NATRIURETIC PEPTIDE: B Natriuretic Peptide: >4500 pg/mL — ABNORMAL HIGH (ref 0.0–100.0)

## 2022-06-12 MED ORDER — NOREPINEPHRINE 4 MG/250ML-% IV SOLN
INTRAVENOUS | Status: AC
  Filled 2022-06-12: qty 250

## 2022-06-12 MED ORDER — VANCOMYCIN HCL 500 MG/100ML IV SOLN: 500.0000 mg | INTRAVENOUS | Status: DC

## 2022-06-12 MED ORDER — PIPERACILLIN-TAZOBACTAM IN DEX 2-0.25 GM/50ML IV SOLN
2.2500 g | Freq: Three times a day (TID) | INTRAVENOUS | Status: DC
  Administered 2022-06-12 – 2022-06-14 (×5): 2.25 g via INTRAVENOUS
  Filled 2022-06-12 (×7): qty 50

## 2022-06-12 MED ORDER — NOREPINEPHRINE 4 MG/250ML-% IV SOLN
0.0000 ug/min | INTRAVENOUS | Status: DC
  Administered 2022-06-12: 6 ug/min via INTRAVENOUS

## 2022-06-12 MED ORDER — VANCOMYCIN HCL 1250 MG/250ML IV SOLN
1250.0000 mg | Freq: Once | INTRAVENOUS | Status: AC
  Administered 2022-06-12: 1250 mg via INTRAVENOUS
  Filled 2022-06-12: qty 250

## 2022-06-12 MED ORDER — NOREPINEPHRINE 16 MG/250ML-% IV SOLN
0.0000 ug/min | INTRAVENOUS | Status: DC
  Administered 2022-06-12: 2 ug/min via INTRAVENOUS
  Filled 2022-06-12: qty 250

## 2022-06-12 MED ORDER — ORAL CARE MOUTH RINSE
15.0000 mL | OROMUCOSAL | Status: DC
  Administered 2022-06-12 – 2022-06-17 (×18): 15 mL via OROMUCOSAL

## 2022-06-12 MED ORDER — ALBUMIN HUMAN 25 % IV SOLN
25.0000 g | Freq: Once | INTRAVENOUS | Status: AC
  Administered 2022-06-12: 25 g via INTRAVENOUS
  Filled 2022-06-12: qty 100

## 2022-06-12 MED ORDER — EPOETIN ALFA 10000 UNIT/ML IJ SOLN
4000.0000 [IU] | INTRAMUSCULAR | Status: DC
  Administered 2022-06-13 – 2022-06-15 (×2): 4000 [IU] via INTRAVENOUS
  Filled 2022-06-12 (×2): qty 1

## 2022-06-12 MED ORDER — ORAL CARE MOUTH RINSE: 15.0000 mL | OROMUCOSAL | Status: DC | PRN

## 2022-06-12 MED ORDER — LACTATED RINGERS IV BOLUS
1000.0000 mL | Freq: Once | INTRAVENOUS | Status: AC
  Administered 2022-06-12: 1000 mL via INTRAVENOUS

## 2022-06-12 MED ORDER — VASOPRESSIN 20 UNITS/100 ML INFUSION FOR SHOCK
0.0000 [IU]/min | INTRAVENOUS | Status: DC
  Administered 2022-06-12: 0.03 [IU]/min via INTRAVENOUS
  Filled 2022-06-12: qty 100

## 2022-06-12 MED ORDER — ALBUMIN HUMAN 25 % IV SOLN
25.0000 g | Freq: Once | INTRAVENOUS | Status: AC
Start: 2022-06-12 — End: 2022-06-12
  Administered 2022-06-12: 25 g via INTRAVENOUS
  Filled 2022-06-12: qty 100

## 2022-06-12 MED ORDER — MIDODRINE HCL 5 MG PO TABS
10.0000 mg | ORAL_TABLET | Freq: Three times a day (TID) | ORAL | Status: DC
  Administered 2022-06-12 – 2022-06-15 (×4): 10 mg via ORAL
  Filled 2022-06-12 (×5): qty 2

## 2022-06-12 NOTE — Progress Notes (Signed)
[facility]  ROUNDING NOTE   Subjective:   Ernest [name] is a 76 year old African-American male with past medical conditions including CAD, COPD, CHF, diabetes, stroke with late seizures, hypertension, and end-stage renal disease on hemodialysis.  Patient presents to the emergency department from [facility] with concerns of left foot wound and has been admitted for Dry gangrene [facility]) [I96]  Patient is known to our practice from previous admissions and receives outpatient dialysis treatments at [facility] on a MWF schedule, supervised by [facility] physicians.    Update: Patient seen sitting up in bed Staff at bedside assisting with breakfast tray Denies pain or discomfort   Objective:  Vital signs in last 24 hours:  Temp:  [97.6 F (36.4 C)-98.3 F (36.8 C)] 97.9 F (36.6 C) (08/29 1231) Pulse Rate:  [93-117] 117 (08/29 1231) Resp:  [12-17] 17 (08/29 1231) BP: (68-154)/(38-109) 70/56 (08/29 1231) SpO2:  [94 %-98 %] 94 % (08/29 1231)  Weight change:  Filed Weights   06/02/22 2026 06/06/22 0809 06/06/22 1230  Weight: 57.9 kg 56.9 kg 55.9 kg    Intake/Output: I/O last 3 completed shifts: In: 1610 [P.O.:240; NG/GT:230; IV Piggyback:5394] Out: 1500 [Other:1500]   Intake/Output this shift:  Total I/O In: 128.4 [IV Piggyback:128.4] Out: -   Physical Exam: General: NAD  Head: Normocephalic, atraumatic. Moist oral mucosal membranes  Eyes: Anicteric  Lungs:  Clear to auscultation, normal effort, room air  Heart: Irregular rhythm  Abdomen:  Soft, nontender, nondistended  Extremities: No peripheral edema.  Neurologic: Nonfocal, right and left AKA  Skin: Surgical dressing left AKA  Access: Right thigh PermCath    Basic Metabolic Panel: Recent Labs  Lab 06/08/22 0815 06/09/22 0500 06/11/22 0924 06/12/22 1119  NA 136 136 132* 136  K 3.4* 3.9 4.6 4.4  CL 99 100 95* 101  CO2 28 28 27 27   GLUCOSE 135* 128* 187* 120*  BUN 42* 24* 49* 33*   CREATININE 4.57* 2.85* 4.33* 3.19*  CALCIUM 9.0 8.5* 8.7* 9.1  MG  --  2.0  --   --   PHOS  --   --  3.5  --      Liver Function Tests: Recent Labs  Lab 06/11/22 0924  ALBUMIN 2.1*    No results for input(s): "LIPASE", "AMYLASE" in the last 168 hours. No results for input(s): "AMMONIA" in the last 168 hours.  CBC: Recent Labs  Lab 06/08/22 0815 06/09/22 0500 06/10/22 0517 06/11/22 0416 06/12/22 1119  WBC 9.9 17.9* 17.6* 15.5* 15.8*  HGB 11.3* 9.6* 9.1* 8.9* 8.0*  HCT 36.1* 30.2* 28.5* 27.8* 25.4*  MCV 78.8* 79.7* 78.3* 78.3* 79.9*  PLT 323 349 326 281 385     Cardiac Enzymes: No results for input(s): "CKTOTAL", "CKMB", "CKMBINDEX", "TROPONINI" in the last 168 hours.  BNP: Invalid input(s): "POCBNP"  CBG: Recent Labs  Lab 06/06/22 1423 06/08/22 1028  GLUCAP 113* 129*     Microbiology: Results for orders placed or performed during the hospital encounter of 06/02/22  Blood culture (routine x 2)     Status: None   Collection Time: 06/02/22  4:24 PM   Specimen: BLOOD  Result Value Ref Range Status   Specimen Description BLOOD LEFT ANTECUBITAL  Final   Special Requests   Final    BOTTLES DRAWN AEROBIC AND ANAEROBIC Blood Culture results may not be optimal due to an inadequate volume of blood received in culture bottles   Culture   Final    NO GROWTH 5  DAYS Performed at [facility], [address] [place], [place] [address]    Report Status 06/07/2022 FINAL  Final  Blood culture (routine x 2)     Status: None   Collection Time: 06/02/22  8:45 PM   Specimen: BLOOD LEFT HAND  Result Value Ref Range Status   Specimen Description BLOOD LEFT HAND  Final   Special Requests   Final    BOTTLES DRAWN AEROBIC AND ANAEROBIC Blood Culture adequate volume   Culture   Final    NO GROWTH 5 DAYS Performed at [facility], [address] [place], [place] [address]    Report Status 06/07/2022 FINAL  Final    Coagulation Studies: No results  for input(s): "LABPROT", "INR" in the last 72 hours.  Urinalysis: No results for input(s): "COLORURINE", "LABSPEC", "PHURINE", "GLUCOSEU", "HGBUR", "BILIRUBINUR", "KETONESUR", "PROTEINUR", "UROBILINOGEN", "NITRITE", "LEUKOCYTESUR" in the last 72 hours.  Invalid input(s): "APPERANCEUR"    Imaging: DG Chest Port 1 View  Result Date: 06/12/2022 CLINICAL DATA:  Sepsis, CHF, COPD EXAM: PORTABLE CHEST 1 VIEW COMPARISON:  Portable exam 0915 hours compared to 09/25/2021; correlation CT angio head/neck 10/29/2016 FINDINGS: Dual lumen central venous catheter via infra diaphragmatic approach with tip projecting over the SVC/RIGHT atrial junction. Enlargement of cardiac silhouette post CABG and MVR. Persistent LEFT pleural effusion and compressive atelectasis of LEFT lung. Underlying pulmonary infiltrates bilaterally. Calcified mass LEFT paratracheal, corresponding to calcified LEFT thyroid mass on prior CT from 2018. No pneumothorax or acute osseous findings. Atherosclerotic calcifications aorta with stent identified at RIGHT axillary vessels. IMPRESSION: Persistent LEFT pleural effusion and compressive atelectasis of LEFT lung. Mild diffuse pulmonary infiltrates question pulmonary edema. Mild enlargement of cardiac silhouette post CABG and MVR. Calcified LEFT thyroid mass unchanged since 10/28/2016; Stability for greater than 5 years implies benignity; no biopsy or followup indicated (ref: J Am Coll Radiol. 2015 Feb;12(2): 143-50). Electronically Signed   By: Lavonia Dana M.D.   On: 06/12/2022 09:29     Medications:    levETIRAcetam Stopped (06/12/22 0949)   levETIRAcetam Stopped (06/12/22 0747)   norepinephrine (LEVOPHED) Adult infusion 6 mcg/min (06/12/22 1305)   piperacillin-tazobactam (ZOSYN)  IV     vancomycin     [START ON 06/13/2022] vancomycin      (feeding supplement) PROSource Plus  30 mL Oral TID BM   apixaban  2.5 mg Oral BID   vitamin C  500 mg Oral BID   atorvastatin  40 mg Oral Daily    Chlorhexidine Gluconate Cloth  6 each Topical Daily   Chlorhexidine Gluconate Cloth  6 each Topical Once   DULoxetine  40 mg Oral BID   feeding supplement (NEPRO CARB STEADY)  237 mL Oral TID BM   midodrine  10 mg Oral TID WC   multivitamin  1 tablet Oral QHS   pantoprazole  40 mg Oral Daily   polyethylene glycol  17 g Oral Daily   senna-docusate  1 tablet Oral QHS   sevelamer carbonate  800 mg Oral TID WC   zinc sulfate  220 mg Oral Daily   acetaminophen, haloperidol lactate, HYDROmorphone (DILAUDID) injection, ondansetron **OR** ondansetron (ZOFRAN) IV, ondansetron (ZOFRAN) IV, oxyCODONE-acetaminophen  Assessment/ Plan:  Mr. WELLES WALTHALL is a 76 y.o.  male  with past medical conditions including CAD, COPD, CHF, diabetes, stroke with late seizures, hypertension, and end-stage renal disease on hemodialysis.  Patient presents to the emergency department from [facility] with concerns of left foot wound and has been admitted  for 06/02/2022  3:25 PM   [facility]/MWF/right thigh PermCath  End-stage renal disease on hemodialysis.   Patient received dialysis yesterday UF 1.5 L achieved.  Next treatment scheduled for Wednesday. Will monitor discharge plan to determine changes in outpatient clinic.  2. Anemia of chronic kidney disease Lab Results  Component Value Date   HGB 8.0 (L) 06/12/2022    Patient receives [place] outpatient.  We will start low-dose EPO with dialysis.  3. Secondary Hyperparathyroidism: with outpatient labs: PTH 534, phosphorus 4.5, calcium 8.0 on 05/21/2022.   Lab Results  Component Value Date   PTH 63 10/05/2017   PTH Comment 10/05/2017   CALCIUM 9.1 06/12/2022   CAION 1.05 (L) 07/12/2020   PHOS 3.5 06/11/2022  Bone minerals remain at goal.  We will continue to monitor Continue sevelamer with meals.  4.  Hypertension with chronic kidney disease.  Home regimen includes amlodipine. Blood pressure soft this morning, 85/58.  Amlodipine stopped,  patient placed on midodrine 5 mg 3 times daily. Blood pressure soft this morning, 74/38.  Midodrine increased to 10 mg 3 times daily  5.  Dry gangrene,  on left foot.  Patient was admitted with dry gangrene.  Patient is now s/p AKA Vascular surgery and primary team are following     LOS: 10 [place] 8/29/20231:31 PM

## 2022-06-12 NOTE — Progress Notes (Signed)
   06/12/22 1050  Assess: MEWS Score  BP (!) 68/59  MAP (mmHg) (!) 64  Pulse Rate (!) 114  Level of Consciousness Alert  SpO2 97 %  O2 Device Room Air  Assess: MEWS Score  MEWS Temp 0  MEWS Systolic 3  MEWS Pulse 2  MEWS RR 0  MEWS LOC 0  MEWS Score 5  MEWS Score Color Red  Assess: if the MEWS score is Yellow or Red  Were vital signs taken at a resting state? Yes  Focused Assessment No change from prior assessment  Does the patient meet 2 or more of the SIRS criteria? Yes  Does the patient have a confirmed or suspected source of infection? No  MEWS guidelines implemented *See Row Information* Yes  Treat  MEWS Interventions Administered scheduled meds/treatments  Pain Scale 0-10  Pain Score 0  Escalate  MEWS: Escalate Red: discuss with charge nurse/RN and provider, consider discussing with RRT  Notify: Charge Nurse/RN  Name of Charge Nurse/RN Notified Jo, RN  Date Charge Nurse/RN Notified 06/12/22  Time Charge Nurse/RN Notified 1054  Notify: Provider  Provider Name/Title Roosevelt Locks, MD  Date Provider Notified 06/12/22  Time Provider Notified 1054  Assess: SIRS CRITERIA  SIRS Temperature  0  SIRS Pulse 1  SIRS Respirations  0  SIRS WBC 1  SIRS Score Sum  2

## 2022-06-12 NOTE — Consult Note (Addendum)
Pharmacy Antibiotic Note  Ernest [name] is a 76 y.o. male admitted on 06/02/2022 with sepsis. PMH significant for CHF, COPD, CAD, Aflutter on eliquis, ESRD on HD MWF, DM2, Stroke, Seizures, HLD, HTN, dementia, bilateral AKA. Patient recently completed a course of Unasyn and Vancomycin for dry gangrene of the L foot (s/p AKA on 8/25). Leukocytosis noted 8/26. WBC remains elevated. Patient is afebrile but tachycardic and hypotensive. 8/29 CXR shows mild diffuse pulmonary infiltrates. Procalcitonin and new BCx pending. Pharmacy has been consulted for vancomycin and zosyn dosing.  Plan: Day 1 of antibiotics Give Vancomycin 1250 mg IV x1 followed by 500 mg IV after HD on MWF. Goal vanc level 15-25. Vd=50.3 L Weight used: Actual BW, Vd coefficient used: 0.9 Start Zosyn 2.25 g IV Q8H Monitor culture results and clinical improvement  Height: 5\' 9"  (175.3 cm) Weight: 55.9 kg (123 lb 3.8 oz) IBW/kg (Calculated) : 70.7  Temp (24hrs), Avg:98 F (36.7 C), Min:97.6 F (36.4 C), Max:98.3 F (36.8 C)  Recent Labs  Lab 06/08/22 0815 06/09/22 0500 06/10/22 0517 06/11/22 0416 06/11/22 0924 06/12/22 1119  WBC 9.9 17.9* 17.6* 15.5*  --  15.8*  CREATININE 4.57* 2.85*  --   --  4.33*  --     Estimated Creatinine Clearance: 12 mL/min (A) (by C-G formula based on SCr of 4.33 mg/dL (H)).    [name]  Antimicrobials this admission: 8/21 Unasyn >> 8/26 8/21 Vancomycin  >> 8/23 8/29 Vancomycin >> 8/29 Zosyn >>  Dose adjustments this admission: N/A  Microbiology results: 8/19 BCx: NG final 8/29 BCx: in process   Thank you for allowing pharmacy to be a part of this patient's care.  Gretel Acre, PharmD PGY1 Pharmacy Resident 06/12/2022 11:49 AM

## 2022-06-12 NOTE — Progress Notes (Signed)
Continuing to follow for outpatient hemodialysis discharge planning. Patient is known at [facility] MWF 6:40am.

## 2022-06-12 NOTE — Progress Notes (Signed)
[facility]  Daily Progress Note   Subjective  -   Patient sleepy but responds to stimuli. Not able to provide useful history. Was in dialysis yesterday so OR dressing removed today  Objective Vitals:   06/11/22 2012 06/11/22 2224 06/12/22 0019 06/12/22 0447  BP: (!) 89/69 (!) 142/76 (!) 154/101 (!) 148/109  Pulse: (!) 117 (!) 107 (!) 112 (!) 110  Resp: 16 16 16 16   Temp: 97.6 F (36.4 C) 98.2 F (36.8 C) 98.3 F (36.8 C) 98.2 F (36.8 C)  TempSrc: Oral Oral    SpO2: 94% 94% 95% 95%  Weight:      Height:        Intake/Output Summary (Last 24 hours) at 06/12/2022 0828 Last data filed at 06/12/2022 5809 Gross per 24 hour  Intake 4894 ml  Output 1500 ml  Net 3394 ml    PULM  CTAB CV  tachycardc VASC  Stump is C/D/I without erythema or drainage.  Laboratory CBC    Component Value Date/Time   WBC 15.5 (H) 06/11/2022 0416   HGB 8.9 (L) 06/11/2022 0416   HCT 27.8 (L) 06/11/2022 0416   PLT 281 06/11/2022 0416    BMET    Component Value Date/Time   NA 132 (L) 06/11/2022 0924   NA 140 12/11/2017 0000   K 4.6 06/11/2022 0924   CL 95 (L) 06/11/2022 0924   CO2 27 06/11/2022 0924   GLUCOSE 187 (H) 06/11/2022 0924   BUN 49 (H) 06/11/2022 0924   BUN 16 12/11/2017 0000   CREATININE 4.33 (H) 06/11/2022 0924   CALCIUM 8.7 (L) 06/11/2022 0924   CALCIUM 8.4 (L) 10/05/2017 0615   GFRNONAA 14 (L) 06/11/2022 0924   GFRAA 21 (L) 12/04/2019 2027    Assessment/Planning: POD #4 s/p left AKA  OR dressing removed today.   Site looks good Xeroform and dry kerlex wrap to site daily OK to discharge to SNF from vascular POV Outpatient follow up order put in chart for 3 weeks in our clinic with Eulogio Ditch NP to check wound Will sign off, please call with questions   [name]  06/12/2022, 8:28 AM

## 2022-06-12 NOTE — Progress Notes (Signed)
NP made aware of critical troponin.

## 2022-06-12 NOTE — Progress Notes (Signed)
Pt came from the floor, CBG was 116, first bp was WNL, then pt became hypotensive, per MD a MAP of 50 and above is acceptable. put pt in reverse trendelenburg, notified MD, 1 L LR bolus ordered and given, pt continued to drop his bp to a map in the low 40's, notified MD, Levo ordered and started went to a max of 30 through PIV per MD with no response to any interventions, Started on 3L [place] as well. MD notified pts daughter, daughter on her way. Pt then went into vtach several times with HR going into 200's, MD notified no new orders. NP currently at bedside putting in CVC and hopefully ART line. Will continue to monitor.

## 2022-06-12 NOTE — Procedures (Signed)
Arterial Catheter Insertion Procedure Note  ALADDIN KOLLMANN  [id number]  02/06/1946  Date:06/12/22  Time:3:19 PM    Provider Performing: Teressa Lower    Procedure: Insertion of Arterial Line [phone]) with US guidance (62863)   Indication(s) Blood pressure monitoring and/or need for frequent ABGs  Consent Unable to obtain consent due to emergent nature of procedure.  Anesthesia None   Time Out Verified patient identification, verified procedure, site/side was marked, verified correct patient position, special equipment/implants available, medications/allergies/relevant history reviewed, required imaging and test results available.   Sterile Technique Maximal sterile technique including full sterile barrier drape, hand hygiene, sterile gown, sterile gloves, mask, hair covering, sterile ultrasound probe cover (if used).   Procedure Description Area of catheter insertion was cleaned with chlorhexidine and draped in sterile fashion. With real-time ultrasound guidance an arterial catheter was placed into the left femoral artery.  Appropriate arterial tracings confirmed on monitor.     Complications/Tolerance None; patient tolerated the procedure well.   EBL Minimal   Specimen(s) None  Donell Beers, AGNP  Pulmonary/Critical Care Pager [phone] (please enter 7 digits) PCCM Consult Pager [phone] (please enter 7 digits)

## 2022-06-12 NOTE — Procedures (Signed)
Central Venous Catheter Insertion Procedure Note  ARCENIO MULLALY  [id number]  June 10, 1946  Date:06/12/22  Time:3:18 PM   Provider Performing:Dominick Zertuche Micheline Chapman   Procedure: Insertion of Non-tunneled Central Venous [phone]) with US guidance (33007)   Indication(s) Medication administration and Difficult access  Consent Risks of the procedure as well as the alternatives and risks of each were explained to the patient and/or caregiver.  Consent for the procedure was obtained and is signed in the bedside chart  Anesthesia Topical only with 1% lidocaine   Timeout Verified patient identification, verified procedure, site/side was marked, verified correct patient position, special equipment/implants available, medications/allergies/relevant history reviewed, required imaging and test results available.  Sterile Technique Maximal sterile technique including full sterile barrier drape, hand hygiene, sterile gown, sterile gloves, mask, hair covering, sterile ultrasound probe cover (if used).  Procedure Description Area of catheter insertion was cleaned with chlorhexidine and draped in sterile fashion.  With real-time ultrasound guidance a central venous catheter was placed into the left femoral vein. Nonpulsatile blood flow and easy flushing noted in all ports.  The catheter was sutured in place and sterile dressing applied.  Complications/Tolerance None; patient tolerated the procedure well. Chest X-ray is ordered to verify placement for internal jugular or subclavian cannulation.   Chest x-ray is not ordered for femoral cannulation.  EBL Minimal  Specimen(s) None  Donell Beers, AGNP  Pulmonary/Critical Care Pager [phone] (please enter 7 digits) PCCM Consult Pager [phone] (please enter 7 digits)

## 2022-06-12 NOTE — Progress Notes (Addendum)
Progress Note   Patient: Ernest [name] BWI:[id number] DOB: 1946/05/04 DOA: 06/02/2022     10 DOS: the patient was seen and examined on 06/12/2022   Brief hospital course: 76 year old man with chronic systolic congestive heart failure, COPD, end-stage renal disease, hepatitis C, hypertension, stroke.  Patient brought in by family secondary to gangrene left foot.  Started empirically on antibiotics.  On 06/04/2022 patient refused dialysis and refused medications.  Patient declined amputation at this time.  Appreciate palliative care consultation and patient was made a DO NOT RESUSCITATE. Patient is seen by palliative care for poor prognosis.  8/23.  Patient was agreeable and dialyzed today. 8/25.  Left AKA  Currently pending nursing home placement.  Assessment and Plan: Dry gangrene of the left foot.  Status post left AKA. Peripheral vascular disease. Right AKA. Condition stable.  Antibiotics completed. Leukocytosis also getting better.  Currently pending insurance approval for nursing home placement.   End-stage renal disease on dialysis Secondary hyperparathyroidism. Hypokalemia. Hypotension. Patient is on scheduled dialysis. Patient patient did not eat or drink since last night due to poor appetite.  He also refused to take his midodrine.  He received 250 mL of fluids last night, he also received albumin for hypotension. I repeated chest x-ray, did not show any evidence of infection.  Patient does have some vascular congestion and pleural effusion. I also rechecked blood cultures and CBC.  Patient has a chronic hypotension with dialysis.  Worsening hypotension appears to be due to refusing midodrine.  I will increase the dose of midodrine for now.  Hold off antibiotics for now.   Vascular dementia. History of stroke. 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 Stable.   Chronic systolic congestive heart failure. Stable.   Stage II gluteal cleft pressure ulcer. Follow.   Severe  protein calorie malnutrition Patient is a feeder, his appetite appears to be improving.  Prognosis. Patient long-term prognosis is poor, he will be continued followed by palliative care after discharge from the hospital.    Addendum. Patient has a persistent hypotension, received 25 g albumin, midodrine.  Blood pressure still low, will transfer to ICU for pressor. We will start antibiotics with Zosyn and vancomycin.  Subjective:  Patient did not feel like eating since last night, blood pressure running low.  Receiving albumin. Patient has some baseline confusion. He was on 2 L oxygen last night, off oxygen now without hypoxia. No shortness of breath or cough today.  Physical Exam: Vitals:   06/11/22 2224 06/12/22 0019 06/12/22 0447 06/12/22 0859  BP: (!) 142/76 (!) 154/101 (!) 148/109 (!) 74/38  Pulse: (!) 107 (!) 112 (!) 110 93  Resp: 16 16 16 17   Temp: 98.2 F (36.8 C) 98.3 F (36.8 C) 98.2 F (36.8 C) 98.3 F (36.8 C)  TempSrc: Oral   Oral  SpO2: 94% 95% 95% 98%  Weight:      Height:       General exam: Ill-appearing, cachectic. Respiratory system: Decreased breathing sounds without crackles. Respiratory effort normal. Cardiovascular system: Regular and tachycardic.[name] No JVD, murmurs, rubs, gallops or clicks. No pedal edema. Gastrointestinal system: Abdomen is nondistended, soft and nontender. No organomegaly or masses felt. Normal bowel sounds heard. Central nervous system: Alert and oriented x2. No focal neurological deficits. Extremities: Bilateral BKA, new stump looks clean Skin: No rashes, lesions or ulcers Psychiatry: Flat affect Data Reviewed:  Reviewed chest x-ray and the lab results.  Family Communication: Daughter, POA updated  Disposition: Status is: Inpatient Remains inpatient appropriate because: severity  of disease  Planned Discharge Destination: Skilled nursing facility    Time spent: 55 minutes  Author: Sharen Hones, MD 06/12/2022 10:13  AM  For on call review [email or web address].

## 2022-06-12 NOTE — Consult Note (Signed)
NAME:  Ernest [name], MRN:  [id number], DOB:  1946/01/10, LOS: 10 ADMISSION DATE:  06/02/2022, CONSULTATION DATE: 06/12/22 REFERRING MD: Dr. Roosevelt Locks, CHIEF COMPLAINT: Hypotension    History of Present Illness:  This is a 76 yo male with a PMH of ESRD on HD (M-W-F), Stroke, Seizures on Keppra, COPD, Right AKA, Chronic Systolic CHF, Type II Diabetes Mellitus, and Hepatitis C.  He presented to [facility] ER on 08/19 from [facility] with dry gangrenous left foot.  He has been following with Vascular Surgeon Dr. Lucky Cowboy in the outpatient setting.  At baseline he has no left lower extremity palpable distal pulses.    ED Course  Upon arrival to the ER due to concern of severe left gangrenous foot provider discussed case with on call Vascular Surgeon Dr. Trula Slade.  Upon reviewing images of the left foot Dr. Trula Slade felt pt would likely require BKA, however deferred to Vascular surgeon who see's the pt in the outpatient setting to determine if he would like to repeat an angiogram first.  Pt subsequently admitted to the [facility] unit per hospitalist team for additional workup and treatment.   See detailed hospital course under significant events  Pertinent  Medical History   Active Ambulatory Problems    Diagnosis Date Noted   Gout, unspecified 02/28/2009   Hyperlipidemia associated with type 2 diabetes mellitus ([place])    ESRD (end stage renal disease) ([place])    Erectile dysfunction associated with type 2 diabetes mellitus ([place])    Hypertension due to end stage renal disease caused by type 2 diabetes mellitus, on dialysis [facility])    COPD (chronic obstructive pulmonary disease) ([place])    Coronary artery disease involving native coronary artery of native heart without angina pectoris 11/18/2020   Chronic HFrEF (heart failure with reduced ejection fraction) ([place])    Obstructive sleep apnea 06/16/2013   Seizure ([place]) 04/29/2015   History of CVA (cerebrovascular accident) 08/07/2015   Seizure, late effect of stroke  ([place]) 08/07/2015   Diabetic peripheral neuropathy ([place]) 08/07/2015   Essential hypertension 09/18/2015   TIA (transient ischemic attack) 10/27/2016   Left leg weakness    Dyspnea 09/08/2017   Non-ST elevation MI (NSTEMI) ([place])    Acute on chronic combined systolic and diastolic CHF (congestive heart failure) ([place])    End-stage renal disease on hemodialysis (HCC)    NSVT (nonsustained ventricular tachycardia) ([place])    Cerebral embolism with cerebral infarction 09/20/2017   S/P CABG x 4 10/04/2017   Vascular dementia ([place]) 10/10/2017   Atrial flutter ([place]) 10/10/2017   Chest pain 12/02/2018   Pleural effusion on left 09/30/2019   Acute respiratory failure with hypoxia ([place]) 09/30/2019   Pleural effusion 09/30/2019   Dependence on renal dialysis ([place]) 08/09/2015   Altered mental status, unspecified 10/09/2017   Anemia of chronic disease 12/27/2011   Coagulation defect, unspecified ([place]) 07/16/2014   Elevated prostate specific antigen (PSA) 08/03/2013   Encounter for immunization 07/10/2013   Iron deficiency anemia, unspecified 10/30/2008   Long term (current) use of insulin ([place]) 11/03/2009   Other malaise 10/09/2017   Pruritus, unspecified 05/26/2014   Secondary hyperparathyroidism of renal origin ([place]) 10/30/2008   Type 2 diabetes mellitus with diabetic peripheral angiopathy without gangrene ([place]) 07/01/2014   Unspecified protein-calorie malnutrition ([place]) 05/27/2015   Diarrhea, unspecified 11/18/2020   Disorder of phosphorus metabolism, unspecified 02/20/2021   Fever, unspecified 11/18/2020   Hypercalcemia 12/08/2020   Other fluid overload 11/18/2020   Sepsis ([place]) 06/27/2021   Atherosclerosis  of native arteries of the extremities with gangrene ([place]) 07/08/2021   Wet gangrene ([place]) 07/10/2021   S/P AKA (above knee amputation), right ([place]) 07/10/2021   Hypotension    Pressure ulcer 07/16/2021   Pressure injury of skin 07/19/2021   Acquired absence of right leg above knee  ([place]) 07/21/2021   Bacterial infection, unspecified 03/31/2021   Contact with and (suspected) exposure to covid-19 03/13/2021   COVID-19 03/07/2021   Pain, unspecified 11/18/2020   Osteomyelitis of right foot ([place])    Chronic respiratory failure with hypoxia ([place]) 10/02/2021   Slurred speech 10/02/2021   Resolved Ambulatory Problems    Diagnosis Date Noted   AODM 02/28/2009   Exudative retinopathy 02/28/2009   CKD (chronic kidney disease) stage V requiring chronic dialysis ([place]) 02/28/2009   Dyspnea 06/12/2011   Cardiomyopathy, ischemic 06/10/2013   Obesity    Hepatitis C    Ejection fraction < 50%    Bacteremia    Numbness 09/29/2013   Pain in limb 12/15/2013   Simple partial seizure evolving to complex partial seizure evolving to generalized seizure ([place]) 04/27/2015   Bradycardia 04/29/2015   Hyperglycemia 04/29/2015   Cerebral infarction due to thrombosis of right vertebral artery ([place]) 07/11/2015   Diplopia 07/11/2015   Right pontine cerebrovascular accident ([place]) 07/13/2015   Right pontine stroke ([place]) 09/18/2015   Hyperlipidemia 09/18/2015   Seizures ([place]) 09/18/2015   Elevated troponin    Acute delirium    Past Medical History:  Diagnosis Date   Abnormal stress test    Anemia    Anxiety    Arthritis    Chronic systolic CHF (congestive heart failure) (HCC)    Coronary artery disease    Depression    DVT (deep venous thrombosis) (HCC)    Erectile dysfunction    Gout    History of seizures    Hypertension    Mitral valve disease    Retinopathy    Shortness of breath    Stroke ([place])    Significant Hospital Events: Including procedures, antibiotic start and stop dates in addition to other pertinent events   08/19: Pt admitted to the [facility] unit with left dry gangrenous foot for further evaluation by vascular surgeon  08/22: Palliative Care consulted pts code status confirmed DNR/DNI  08/22: Psychiatry consulted due to pt becoming verbally aggressive and  refusing care, however aggression resolved and pt agreeable to continued care.  Psychiatry recommended for recurrence of aggressive behavior consider prn risperidone  08/25: Pt underwent left AKA per vascular surgery  08/29: Pt developed hypotension and acute encephalopathy requiring transfer to ICU for vasopressor therapy   Interim History / Subjective:  Pt with new onset confusion alert to self only, however able to follow commands.  Noted to have left upward gaze.  Upon arrival to ICU initial map >65 with albumin and LR bolus infusing.  However, pt quickly became hypotensive with sbp 50's requiring levophed gtt @30  mcg/min.    Objective   Blood pressure (!) 70/56, pulse (!) 117, temperature 97.9 F (36.6 C), temperature source Oral, resp. rate 17, height 5\' 9"  (1.753 m), weight 55.9 kg, SpO2 94 %.        Intake/Output Summary (Last 24 hours) at 06/12/2022 1312 Last data filed at 06/12/2022 1231 Gross per 24 hour  Intake 5022.36 ml  Output --  Net 5022.36 ml   Filed Weights   06/02/22 2026 06/06/22 0809 06/06/22 1230  Weight: 57.9 kg 56.9 kg 55.9 kg    Examination: General:  Acute on chronically-ill appearing male, NAD resting in bed  HENT: Supple, no JVD  Lungs: Clear throughout, even, non labored  Cardiovascular: Sinus tachycardia, rrr, no r/g, 1+ radial/1+ femoral pulses present  Abdomen: +BS x4, soft, non tender, non distended  Extremities: Bilateral BKA, left BKA incision site with staples intact draining small amount of serosanguinous fluid Neuro: Awake, oriented to self only, following commands, left upward gaze present, PERRLA  GU: Deferred   Resolved Hospital Problem list    Assessment & Plan:  Dry gangrenous left foot s/p left BKA  Hx: Right AKA, DVT, and PVD  - Continue eliquis  - Prn dilaudid for pain management   Septic shock suspect secondary to pneumonia likely aspiration  - Continuous telemetry monitoring  - Will check troponin - Prn vasopressin and  levophed gtts to maintain map >65 - Continue midodrine if able to tolerate po's   Chronic systolic heart failure-stable - Gentle iv fluid resuscitation  - BNP pending   ESRD on HD  - Trend BMP  - Replace electrolytes as indicated  - Monitor UOP - Avoid nephrotoxic medications  - Nephrology consulted appreciate input~HD per recommendations   Sepsis suspect secondary to pneumonia likely aspiration  - Trend WBC and monitor fever curve - Trend PCT  - Follow cultures  - MRSA PCR negative will discontinue vancomycin and continue zosyn  Acute encephalopathy secondary to metabolic derangements vs. Acute CVA - Once stabilized will order CT Head  - Supportive care  - Avoid sedating medications when able   Best Practice (right click and "Reselect all SmartList Selections" daily)   Diet/type: Regular consistency (see orders) DVT prophylaxis: Eliquis  GI prophylaxis: PPI Lines: Central line and Arterial Line placed 06/12/22 Foley:  N/A Code Status:  DNR Last date of multidisciplinary goals of care discussion [N/A]  Updated pts daughter [name] regarding significant decline in pts condition and HIGH Risk for Sudden Death.  Mrs. Gilford Rile stated she was en route to the hospital from [place], [place], but would attempt to call her friend who is local to check on the pt.  Confirmed pts code status is DNR/DNI Labs   CBC: Recent Labs  Lab 06/08/22 0815 06/09/22 0500 06/10/22 0517 06/11/22 0416 06/12/22 1119  WBC 9.9 17.9* 17.6* 15.5* 15.8*  HGB 11.3* 9.6* 9.1* 8.9* 8.0*  HCT 36.1* 30.2* 28.5* 27.8* 25.4*  MCV 78.8* 79.7* 78.3* 78.3* 79.9*  PLT 323 349 326 281 027    Basic Metabolic Panel: Recent Labs  Lab 06/08/22 0815 06/09/22 0500 06/11/22 0924 06/12/22 1119  NA 136 136 132* 136  K 3.4* 3.9 4.6 4.4  CL 99 100 95* 101  CO2 28 28 27 27   GLUCOSE 135* 128* 187* 120*  BUN 42* 24* 49* 33*  CREATININE 4.57* 2.85* 4.33* 3.19*  CALCIUM 9.0 8.5* 8.7* 9.1  MG  --  2.0  --    --   PHOS  --   --  3.5  --    GFR: Estimated Creatinine Clearance: 16.3 mL/min (A) (by C-G formula based on SCr of 3.19 mg/dL (H)). Recent Labs  Lab 06/09/22 0500 06/10/22 0517 06/11/22 0416 06/12/22 1119  PROCALCITON  --   --   --  0.80  WBC 17.9* 17.6* 15.5* 15.8*    Liver Function Tests: Recent Labs  Lab 06/11/22 0924  ALBUMIN 2.1*   No results for input(s): "LIPASE", "AMYLASE" in the last 168 hours. No results for input(s): "AMMONIA" in the last 168 hours.  ABG  Component Value Date/Time   PHART 7.427 09/15/2017 0320   PCO2ART 37.5 09/15/2017 0320   PO2ART 59.7 (L) 09/15/2017 0320   HCO3 36.0 (H) 09/25/2021 2025   TCO2 34 (H) 07/12/2020 0719   ACIDBASEDEF 4.0 (H) 09/14/2017 0153   O2SAT 87.7 09/25/2021 2025     Coagulation Profile: No results for input(s): "INR", "PROTIME" in the last 168 hours.  Cardiac Enzymes: No results for input(s): "CKTOTAL", "CKMB", "CKMBINDEX", "TROPONINI" in the last 168 hours.  HbA1C: Hemoglobin A1C  Date/Time Value Ref Range Status  12/11/2017 12:00 AM 6.2  Final   Hgb A1c MFr Bld  Date/Time Value Ref Range Status  06/02/2022 03:26 PM 4.9 4.8 - 5.6 % Final    Comment:    (NOTE) Pre diabetes:          5.7%-6.4%  Diabetes:              >6.4%  Glycemic control for   <7.0% adults with diabetes   09/26/2021 06:13 AM 7.0 (H) 4.8 - 5.6 % Final    Comment:    (NOTE)         Prediabetes: 5.7 - 6.4         Diabetes: >6.4         Glycemic control for adults with diabetes: <7.0     CBG: Recent Labs  Lab 06/06/22 1423 06/08/22 1028  GLUCAP 113* 129*    Review of Systems: Positives in BOLD   Gen: Left stump pain, fever, chills, weight change, fatigue, night sweats HEENT: Denies blurred vision, double vision, hearing loss, tinnitus, sinus congestion, rhinorrhea, sore throat, neck stiffness, dysphagia PULM: Denies shortness of breath, cough, sputum production, hemoptysis, wheezing CV: Denies chest pain, edema,  orthopnea, paroxysmal nocturnal dyspnea, palpitations GI: Denies abdominal pain, nausea, vomiting, diarrhea, hematochezia, melena, constipation, change in bowel habits GU: Denies dysuria, hematuria, polyuria, oliguria, urethral discharge Endocrine: Denies hot or cold intolerance, polyuria, polyphagia or appetite change Derm: Denies rash, dry skin, scaling or peeling skin change Heme: Denies easy bruising, bleeding, bleeding gums Neuro: Denies headache, numbness, weakness, slurred speech, loss of memory or consciousness  Past Medical History:  He,  has a past medical history of Abnormal stress test, Anemia, Anxiety, Arthritis, Bacteremia, Chronic systolic CHF (congestive heart failure) (HCC), COPD (chronic obstructive pulmonary disease) ([place]), Coronary artery disease, Depression, DVT (deep venous thrombosis) ([place]), Ejection fraction < 50%, Erectile dysfunction, ESRD (end stage renal disease) ([place]), Gout, Hepatitis C, History of seizures, Hyperlipidemia, Hypertension, Mitral valve disease, Obesity, Retinopathy, Shortness of breath, Stroke ([place]), and Type II or unspecified type diabetes mellitus without mention of complication, not stated as uncontrolled.   Surgical History:   Past Surgical History:  Procedure Laterality Date   A/V FISTULAGRAM N/A 10/04/2017   Procedure: A/V FISTULAGRAM;  Surgeon: Elam Dutch, MD;  Location: [place] [facility];  Service: Cardiovascular;  Laterality: N/A;   A/V SHUNTOGRAM Left 07/12/2020   Procedure: A/V SHUNTOGRAM;  Surgeon: Serafina Mitchell, MD;  Location: [place] [facility];  Service: Cardiovascular;  Laterality: Left;   AMPUTATION Right 07/08/2021   Procedure: AMPUTATION ABOVE KNEE;  Surgeon: Elmore Guise, MD;  Location: [facility] ORS;  Service: Vascular;  Laterality: Right;   AMPUTATION Left 06/08/2022   Procedure: AMPUTATION ABOVE KNEE;  Surgeon: Algernon Huxley, MD;  Location: [facility];  Service: Vascular;  Laterality: Left;   ANGIOPLASTY Left 09/24/2019    Procedure: Angioplasty Innominate Vien;  Surgeon: Serafina Mitchell, MD;  Location: [place];  Service: Vascular;  Laterality: Left;   AV FISTULA PLACEMENT Left 01/13/2013   Procedure: INSERTION OF ARTERIOVENOUS (AV) GORE-TEX GRAFT ARM;  Surgeon: Angelia Mould, MD;  Location: [place];  Service: Vascular;  Laterality: Left;   AV FISTULA PLACEMENT Left 05/05/2013   Procedure: INSERTION OF LEFT UPPER ARM  ARTERIOVENOUS GORTEX GRAFT;  Surgeon: Angelia Mould, MD;  Location: [place];  Service: Vascular;  Laterality: Left;   AV FISTULA PLACEMENT Left 11/26/2019   Procedure: INSERTION OF ARTERIOVENOUS (AV) GORE-TEX GRAFT, left  ARM;  Surgeon: Serafina Mitchell, MD;  Location: [place];  Service: Vascular;  Laterality: Left;   [place] REMOVAL Left 03/26/2013   Procedure: REMOVAL OF  NON INCORPORATED ARTERIOVENOUS GORETEX GRAFT ([place]) left arm * repair of  left brachial artery with vein patch angioplasty.;  Surgeon: Angelia Mould, MD;  Location: [place];  Service: Vascular;  Laterality: Left;   CARDIAC CATHETERIZATION  August 26, 2012   CATARACT EXTRACTION W/ INTRAOCULAR LENS  IMPLANT, BILATERAL     COLONOSCOPY     CORONARY ARTERY BYPASS GRAFT N/A 09/13/2017   Procedure: CORONARY ARTERY BYPASS GRAFTING (CABG) times four LIMA  to LAD, SVG sequentially to OM and RAMUS Intermediate and SVG to Acute Marginal;  Surgeon: Melrose Nakayama, MD;  Location: [place];  Service: Open Heart Surgery;  Laterality: N/A;   DIALYSIS/PERMA CATHETER INSERTION N/A 02/28/2021   Procedure: DIALYSIS/PERMA CATHETER EXCHANGE;  Surgeon: Katha Cabal, MD;  Location: [place] [facility];  Service: Cardiovascular;  Laterality: N/A;   EMBOLECTOMY Right 08/27/2013   Procedure: EMBOLECTOMY;  Surgeon: Mal Misty, MD;  Location: [place];  Service: Vascular;  Laterality: Right;  Thrombectomy of Radial and ulnar artery.   ENDOVASCULAR STENT INSERTION Left 09/24/2019   Procedure: Endovascular Stent Graft Insertion, Arm Graft;   Surgeon: Serafina Mitchell, MD;  Location: [facility] OR;  Service: Vascular;  Laterality: Left;   EYE SURGERY     laser.  and surgery for DM   fistula     RUE and wrist   FISTULOGRAM Left 09/24/2019   Procedure: SHUNTOGRAM OF ARTERIOVENOUS FISTULA LEFT ARM,;  Surgeon: Serafina Mitchell, MD;  Location: [place];  Service: Vascular;  Laterality: Left;   INSERTION OF DIALYSIS CATHETER Right 01/01/2013   Procedure: INSERTION OF DIALYSIS CATHETER right  internal jugular;  Surgeon: Serafina Mitchell, MD;  Location: [place];  Service: Vascular;  Laterality: Right;   INSERTION OF DIALYSIS CATHETER N/A 03/26/2013   Procedure: INSERTION OF DIALYSIS CATHETER Left internal jugular vein;  Surgeon: Angelia Mould, MD;  Location: [place];  Service: Vascular;  Laterality: N/A;   INSERTION OF DIALYSIS CATHETER Right 11/26/2019   Procedure: INSERTION OF DIALYSIS CATHETER, right internal jugular;  Surgeon: Serafina Mitchell, MD;  Location: [place];  Service: Vascular;  Laterality: Right;   IR THORACENTESIS ASP PLEURAL SPACE W/IMG GUIDE  09/30/2019   LEFT HEART CATH AND CORONARY ANGIOGRAPHY N/A 09/09/2017   Procedure: LEFT HEART CATH AND CORONARY ANGIOGRAPHY;  Surgeon: Troy Sine, MD;  Location: [place] [facility];  Service: Cardiovascular;  Laterality: N/A;   LIGATION OF ARTERIOVENOUS  FISTULA Right 12/30/2013   Procedure: REMOVAL OF SEGMENT OF GORTEX GRAFT AND FISTULA  AND REPAIR OF BRACHIAL ARTERY;  Surgeon: Mal Misty, MD;  Location: [place];  Service: Vascular;  Laterality: Right;   LOWER EXTREMITY ANGIOGRAPHY N/A 06/28/2021   Procedure: Lower Extremity Angiography;  Surgeon: Katha Cabal, MD;  Location: [place] [facility];  Service:  Cardiovascular;  Laterality: N/A;   LOWER EXTREMITY ANGIOGRAPHY Left 11/23/2021   Procedure: LOWER EXTREMITY ANGIOGRAPHY;  Surgeon: Algernon Huxley, MD;  Location: [place] [facility];  Service: Cardiovascular;  Laterality: Left;   MITRAL VALVE REPAIR N/A 09/13/2017   Procedure: MITRAL  VALVE REPAIR (MVR);  Surgeon: Melrose Nakayama, MD;  Location: [place];  Service: Open Heart Surgery;  Laterality: N/A;   MITRAL VALVE REPAIR (MV)/CORONARY ARTERY BYPASS GRAFTING (CABG)  09/13/2017   PENILE PROSTHESIS IMPLANT     1997.. no card   PERIPHERAL VASCULAR BALLOON ANGIOPLASTY Left 10/04/2017   Procedure: PERIPHERAL VASCULAR BALLOON ANGIOPLASTY;  Surgeon: Elam Dutch, MD;  Location: [place] [facility];  Service: Cardiovascular;  Laterality: Left;  AVF   PERIPHERAL VASCULAR BALLOON ANGIOPLASTY Left 07/12/2020   Procedure: PERIPHERAL VASCULAR BALLOON ANGIOPLASTY;  Surgeon: Serafina Mitchell, MD;  Location: [place] [facility];  Service: Cardiovascular;  Laterality: Left;  ARM SHUNTOGRAM   REVISION OF ARTERIOVENOUS GORETEX GRAFT Left 09/24/2019   Procedure: REVISION OF ARTERIOVENOUS GORETEX GRAFT LEFT ARM;  Surgeon: Serafina Mitchell, MD;  [facility];  Service: Vascular;  Laterality: Left;   TEE WITHOUT CARDIOVERSION N/A 06/11/2013   Procedure: TRANSESOPHAGEAL ECHOCARDIOGRAM (TEE);  Surgeon: Larey Dresser, MD;  Location: [facility] ENDOSCOPY;  Service: Cardiovascular;  Laterality: N/A;   TEE WITHOUT CARDIOVERSION N/A 09/13/2017   Procedure: TRANSESOPHAGEAL ECHOCARDIOGRAM (TEE);  Surgeon: Melrose Nakayama, MD;  Location: [place];  Service: Open Heart Surgery;  Laterality: N/A;   THROMBECTOMY W/ EMBOLECTOMY Left 03/26/2013   Procedure: Attempted thrombectomy of left arm arteriovenous goretex graft.;  Surgeon: Angelia Mould, MD;  Location: [facility] OR;  Service: Vascular;  Laterality: Left;   ULTRASOUND GUIDANCE FOR VASCULAR ACCESS Bilateral 09/24/2019   Procedure: Ultrasound Guidance For Vascular Access, Right Femoral Vein and Left Arm Graft;  Surgeon: Serafina Mitchell, MD;  Location: [facility] OR;  Service: Vascular;  Laterality: Bilateral;   VENOGRAM N/A 04/07/2013   Procedure: VENOGRAM;  Surgeon: Serafina Mitchell, MD;  Location: [facility] CATH LAB;  Service: Cardiovascular;  Laterality: N/A;      Social History:   reports that he quit smoking about 49 years ago. His smoking use included cigarettes. He has never used smokeless tobacco. He reports that he does not drink alcohol and does not use drugs.   Family History:  His family history includes Diabetes in his brother, sister, and son; Heart disease in his father.   Allergies [name]   Home Medications  Prior to Admission medications   Medication Sig Start Date End Date Taking? Authorizing Provider  amLODipine (NORVASC) 5 MG tablet Take 1 tablet (5 mg total) by mouth daily. 09/28/21  Yes Fritzi Mandes, MD  apixaban (ELIQUIS) 5 MG TABS tablet Take 1 tablet (5 mg total) by mouth 2 (two) times daily. 03/09/22 06/02/22 Yes End, Harrell Gave, MD  atorvastatin (LIPITOR) 40 MG tablet Take 40 mg by mouth at bedtime.   Yes [provider]  DULoxetine (CYMBALTA) 20 MG capsule Take 40 mg by mouth 2 (two) times daily.   Yes [provider]  levETIRAcetam (KEPPRA) 500 MG tablet Take 1 tablet (500 mg total) by mouth 3 (three) times daily. On MWF - administer lunchtime dose once returns from dialysis 10/26/21  Yes Jaffe, Adam R, DO  mirtazapine (REMERON) 7.5 MG tablet Take 7.5 mg by mouth daily. 05/31/22  Yes [provider]  multivitamin (RENA-VIT) TABS tablet Take 1 tablet by mouth at bedtime. 09/28/21  Yes Fritzi Mandes, MD  pantoprazole (PROTONIX) 40 MG tablet Take 1 tablet (40 mg total) by mouth daily. Patient taking differently: Take 40 mg by mouth at bedtime. 07/21/21  Yes Swayze, Ava, DO  polyethylene glycol (MIRALAX / GLYCOLAX) 17 g packet Take 17 g by mouth daily. Dissolve in 4-8 ounces of water   Yes [provider]  senna-docusate (SENOKOT-S) 8.6-50 MG tablet Take 1 tablet by mouth at bedtime.   Yes [provider]  acetaminophen (TYLENOL) 500 MG tablet Take 1,000 mg by mouth every 6 (six) hours as needed for mild pain.    [provider]  levETIRAcetam (KEPPRA) 500 MG tablet  Take 1 tablet (500 mg total) by mouth 2 (two) times daily. Only on Tuesday, Thursday, Saturday, Sunday Patient not taking: Reported on 06/02/2022 10/26/21   Pieter Partridge, DO  sevelamer carbonate (RENVELA) 800 MG tablet Take 800 mg by mouth 3 (three) times daily with meals. On Monday, Wednesday, and Friday. Give NOON dose on MWF asap when returning from dialysis Patient not taking: Reported on 06/02/2022    [provider]     Critical care time: 60 minutes      Donell Beers, [place] Pager [phone] (please enter 7 digits) PCCM Consult Pager [phone] (please enter 7 digits)

## 2022-06-12 NOTE — Progress Notes (Signed)
OT Cancellation Note  Patient Details Name: Ernest [name] MRN: [id number] DOB: 10-29-1945   Cancelled Treatment:    Reason Eval/Treat Not Completed: Medical issues which prohibited therapy. OT order received and chart reviewed. Pt with RED MEWS and hypotensive ( 80/71 supine) this morning. OT to hold until pt is able to actively and safety participate.   Darleen Crocker, MS, OTR/L , CBIS ascom [phone]  06/12/22, 11:39 AM

## 2022-06-12 NOTE — Progress Notes (Signed)
PT Cancellation Note  Patient Details Name: Ernest [name] MRN: [id number] DOB: Jun 16, 1946   Cancelled Treatment:    Reason Eval/Treat Not Completed: Medical issues which prohibited therapy (Consult received and chart reviewed. Patient hypotensive with red MEWS; care team aware and involved.  Will hold PT evaluation at this time and re-attempt at later time/date as medically appropriate and available.)  Naveed Humphres H. Owens Shark, PT, DPT, NCS 06/12/22, 11:35 AM [phone]

## 2022-06-13 DIAGNOSIS — N186 End stage renal disease: Secondary | ICD-10-CM | POA: Diagnosis not present

## 2022-06-13 DIAGNOSIS — F4329 Adjustment disorder with other symptoms: Secondary | ICD-10-CM | POA: Diagnosis not present

## 2022-06-13 DIAGNOSIS — F01518 Vascular dementia, unspecified severity, with other behavioral disturbance: Secondary | ICD-10-CM | POA: Diagnosis not present

## 2022-06-13 DIAGNOSIS — I96 Gangrene, not elsewhere classified: Secondary | ICD-10-CM | POA: Diagnosis not present

## 2022-06-13 DIAGNOSIS — R001 Bradycardia, unspecified: Secondary | ICD-10-CM

## 2022-06-13 LAB — TROPONIN I (HIGH SENSITIVITY)
Troponin I (High Sensitivity): 2114 ng/L (ref <18)
Troponin I (High Sensitivity): 2025 ng/L (ref <18)

## 2022-06-13 LAB — PREPARE RBC (CROSSMATCH)

## 2022-06-13 MED ORDER — SODIUM CHLORIDE 0.9% IV SOLUTION
Freq: Once | INTRAVENOUS | Status: AC
Start: 2022-06-13 — End: 2022-06-13

## 2022-06-13 NOTE — Progress Notes (Signed)
[facility]) Hospital Liaison Note   Received request from MD/Dr. Manuella Ghazi, to contact family to explain services.  MSW contacted daughter/Heather and provided extensive education regarding EOL care & treatment availability/limitations. [name] reports that she is not prepared for diaylsis to end but needs to speak with her family. Family requested additional time to discuss options and will contact MSW or IDT with final decision.   MSW voiced understanding. All questions answered and no concerns voiced.  At this time, patient is not under review for [facility] services as family continues to have ongoing [place] discussions. Lonia Chimera information and contact numbers given to family & above information shared with TOC.   Please call with any questions/concerns.    Thank you for the opportunity to participate in this patient's care.   Daphene Calamity, MSW [facility] Liaison  [phone]

## 2022-06-13 NOTE — Progress Notes (Signed)
Spoke with daughter about blood transfusion and educated her on the importance of administering to patient. Verbalized good understanding.

## 2022-06-13 NOTE — Progress Notes (Signed)
Notified MD Turner with cardiology of pt troponin of 2025. Instructed to call Hospitalist. Read back and verified. Notified Sharion Settler NP of troponin result as well. Awaiting further orders.

## 2022-06-13 NOTE — Progress Notes (Signed)
CHIEF COMPLAINT:   Chief Complaint  Patient presents with   Leg Pain   Wound Infection  Admitted for shock to ICU  Subjective  Weaned off pressors +encephalopathy DNR/DNI Therapy for aspiration pneumonia  UNABLE TO PROVIDE ROS    Significant Hospital Events: Including procedures, antibiotic start and stop dates in addition to other pertinent events   08/19: Pt admitted to the [facility] unit with left dry gangrenous foot for further evaluation by vascular surgeon  08/22: Palliative Care consulted pts code status confirmed DNR/DNI  08/22: Psychiatry consulted due to pt becoming verbally aggressive and refusing care, however aggression resolved and pt agreeable to continued care.  Psychiatry recommended for recurrence of aggressive behavior consider prn risperidone  08/25: Pt underwent left AKA per vascular surgery  08/29: Pt developed hypotension and acute encephalopathy requiring transfer to ICU for vasopressor therapy  8/30 weaned off pressors    Examination:  General exam: ill appearing Respiratory system: Clear to auscultation. Respiratory effort normal. HEENT: [place]/AT, PERRLA, no thrush, no stridor. Cardiovascular system: S1 & S2 heard, RRR. Gastrointestinal system: Abdomen is nondistended Central nervous system: disoriented Extremities:  NO legs  VITALS:  height is 5\' 9"  (1.753 m) and weight is 55.9 kg. His oral temperature is 98.2 F (36.8 C). His blood pressure is 48/37 (abnormal) and his pulse is 112 (abnormal). His respiration is 17 and oxygen saturation is 91%.   I personally reviewed Labs under Results section.  Radiology Reports CT HEAD WO CONTRAST (5MM)  Result Date: 06/12/2022 CLINICAL DATA:  Altered mental status EXAM: CT HEAD WITHOUT CONTRAST TECHNIQUE: Contiguous axial images were obtained from the base of the skull through the vertex without intravenous contrast. RADIATION DOSE REDUCTION: This exam was performed according to the departmental  dose-optimization program which includes automated exposure control, adjustment of the mA and/or kV according to patient size and/or use of iterative reconstruction technique. COMPARISON:  04/20/2022 FINDINGS: Brain: No evidence of acute infarction, hemorrhage, hydrocephalus, extra-axial collection or mass lesion/mass effect. Periventricular and deep white matter hypodensity. Calcification of the bilateral globi pallidi as well as striated vascular calcification of the bilateral corona radiata unchanged (series 3, image 13, 21). Vascular: No hyperdense vessel or unexpected calcification. Skull: Normal. Negative for fracture or focal lesion. Sinuses/Orbits: No acute finding. Other: Extensive vascular calcinosis about the included head and neck. IMPRESSION: No acute intracranial pathology. Small-vessel white matter disease. Electronically Signed   By: Delanna Ahmadi M.D.   On: 06/12/2022 16:06   DG Chest Port 1 View  Result Date: 06/12/2022 CLINICAL DATA:  Sepsis, CHF, COPD EXAM: PORTABLE CHEST 1 VIEW COMPARISON:  Portable exam 0915 hours compared to 09/25/2021; correlation CT angio head/neck 10/29/2016 FINDINGS: Dual lumen central venous catheter via infra diaphragmatic approach with tip projecting over the SVC/RIGHT atrial junction. Enlargement of cardiac silhouette post CABG and MVR. Persistent LEFT pleural effusion and compressive atelectasis of LEFT lung. Underlying pulmonary infiltrates bilaterally. Calcified mass LEFT paratracheal, corresponding to calcified LEFT thyroid mass on prior CT from 2018. No pneumothorax or acute osseous findings. Atherosclerotic calcifications aorta with stent identified at RIGHT axillary vessels. IMPRESSION: Persistent LEFT pleural effusion and compressive atelectasis of LEFT lung. Mild diffuse pulmonary infiltrates question pulmonary edema. Mild enlargement of cardiac silhouette post CABG and MVR. Calcified LEFT thyroid mass unchanged since 10/28/2016; Stability for greater than  5 years implies benignity; no biopsy or followup indicated (ref: J Am Coll Radiol. 2015 Feb;12(2): 143-50). Electronically Signed   By: Lavonia Dana M.D.   On: 06/12/2022  09:29       Assessment/Plan:  Dry gangrenous left foot s/p left BKA  Hx: Right AKA, DVT, and PVD  Septic shock suspect secondary to pneumonia likely aspiration  Responded to IVF's and brief use of pressors Will follow along as needed  SD status   PROGNOSIS IS VERY POOR, RECOMMEND HOSPICE/COMFORT CARE  Diego Ulbricht Patricia Pesa, M.D.  [facility]  Medical Director [place] Director [facility] Cardio-Pulmonary Department

## 2022-06-13 NOTE — Progress Notes (Signed)
  Progress Note   Patient: Ernest [name] UGQ:[id number] DOB: [birth year] DOA: 06/02/2022     11 DOS: the patient was seen and examined on 06/13/2022   Brief hospital course: 76 year old man with chronic systolic congestive heart failure, COPD, end-stage renal disease, hepatitis C, hypertension, stroke.  Patient brought in by family secondary to gangrene left foot.  Started empirically on antibiotics.  On 06/04/2022 patient refused dialysis and refused medications.  Patient declined amputation at this time.  Appreciate palliative care consultation and patient was made a DO NOT RESUSCITATE. Patient is seen by palliative care for poor prognosis.  8/23.  Patient was agreeable and dialyzed today. 8/25.  Left AKA 8/29: Patient was hypotensive requiring transfer to ICU for pressors 8/30: Weaned off pressor, goals of care discussion.  Family still debating on hospice, DNR,   Assessment and Plan: Dry gangrene ([place]) Gangrene left foot.  Status post left AKA on 8/25   End-stage renal disease on hemodialysis ([place]) HD per nephro. He had refused last session.  Vascular dementia ([place]) More stable for now he does get intermittent agitation  Pressure injury of skin Wound care consultation.  Stage II gluteal cleft  Secondary hyperparathyroidism of renal origin [facility]) Dialysis patient  Protein-calorie malnutrition, severe Complicates overall prognosis  Hypokalemia Replaced on admission I will not replace any further being that the patient is a dialysis patient.  Impaired fasting glucose Hemoglobin A1c low at 4.7.  Patient is not a diabetic.  Discontinue fingersticks.  Septic shock (HCC) Persistent hypotension yesterday requiring transfer to ICU for pressors oblique..  This was likely due to aspiration pneumonia.[name]  He is weaned off pressors now and blood pressures are improving.  Continue IV antibiotics  Anemia of chronic disease Hemoglobin 11.1-> 7.1 Unsure if this acute drop is due to recent  procedure/surgery related blood loss We will transfuse 1 PRBC for hemoglobin of 7.1  Elevated troponin Likely due to demand ischemia with severe anemia. Will trend more.  Request cardiology consultation.  2D echo  Seizure, late effect of stroke ([place]) On IV Keppra  Chronic HFrEF (heart failure with reduced ejection fraction) ([place]) Well compensated. Cardio c/s for elevated troponin.  Will obtain 2D echo        Subjective: Resting quietly.  No new issues.  Off pressors  Physical Exam: Vitals:   06/13/22 1745 06/13/22 1800 06/13/22 1815 06/13/22 1828  BP: (!) 112/53 115/60 (!) 113/49 (!) 122/52  Pulse: (!) 103  (!) 107   Resp: 14 12    Temp:      TempSrc:      SpO2:      Weight:      Height:       General exam: Ill-appearing, cachectic. Respiratory system: Decreased breathing sounds without crackles. Respiratory effort normal. Cardiovascular system: Regular and tachycardic.[name] No pedal edema. Gastrointestinal system: Abdomen is benign Central nervous system: Alert and oriented x2. Nonfocal Extremities: Bilateral BKA, no s/s of infection on stump Psychiatry: Flat affect  Data Reviewed:  Hemoglobin 7.1, troponin more than 1500  Family Communication: Updated Heather/daughter  Disposition: Status is: Inpatient Remains inpatient appropriate because: Critically sick with multiorgan failure.  Family still deciding on comfort care/hospice   Planned Discharge Destination: [facility] with palliative care/hospice depending on family decision    DVT prophylaxis-Eliquis Time spent: 35 minutes  Author: Max Sane, MD 06/13/2022 8:15 PM  For on call review [email or web address].

## 2022-06-13 NOTE — Progress Notes (Signed)
[facility]  ROUNDING NOTE   Subjective:   Ernest [name] is a 76 year old African-American male with past medical conditions including CAD, COPD, CHF, diabetes, stroke with late seizures, hypertension, and end-stage renal disease on hemodialysis.  Patient presents to the emergency department from [facility] with concerns of left foot wound and has been admitted for Dry gangrene [facility]) [I96]  Patient is known to our practice from previous admissions and receives outpatient dialysis treatments at [facility] on a MWF schedule, supervised by [facility] physicians.    Update: Patient seen resting quietly in ICU Transferred to ICU due to hypotension, requiring pressors. Patient required pressor overnight however has been stopped at this time. Patient current A-line blood pressure 114/74. Will open eyes to name however no verbal response today  Objective:  Vital signs in last 24 hours:  Temp:  [97.7 F (36.5 C)-98.2 F (36.8 C)] 98.2 F (36.8 C) (08/30 0400) Pulse Rate:  [103-119] 103 (08/30 1200) Resp:  [11-28] 12 (08/30 1200) BP: (48)/(37) 48/37 (08/29 1300) SpO2:  [91 %-100 %] 100 % (08/30 1200) Arterial Line BP: (114-127)/(53-61) 125/60 (08/30 1200)  Weight change:  Filed Weights   06/02/22 2026 06/06/22 0809 06/06/22 1230  Weight: 57.9 kg 56.9 kg 55.9 kg    Intake/Output: I/O last 3 completed shifts: In: 1900.1 [I.V.:232.9; IV Piggyback:1667.2] Out: 0    Intake/Output this shift:  No intake/output data recorded.  Physical Exam: General: NAD  Head: Normocephalic, atraumatic. Moist oral mucosal membranes  Eyes: Anicteric  Lungs:  Clear to auscultation, normal effort, room air  Heart: Irregular rhythm  Abdomen:  Soft, nontender, nondistended  Extremities: No peripheral edema.  Neurologic: Nonfocal, right and left AKA  Skin: Surgical dressing left AKA  Access: Right thigh PermCath    Basic Metabolic Panel: Recent Labs  Lab 06/08/22 0815  06/09/22 0500 06/11/22 0924 06/12/22 1119  NA 136 136 132* 136  K 3.4* 3.9 4.6 4.4  CL 99 100 95* 101  CO2 28 28 27 27   GLUCOSE 135* 128* 187* 120*  BUN 42* 24* 49* 33*  CREATININE 4.57* 2.85* 4.33* 3.19*  CALCIUM 9.0 8.5* 8.7* 9.1  MG  --  2.0  --  1.9  PHOS  --   --  3.5 2.9     Liver Function Tests: Recent Labs  Lab 06/11/22 0924  ALBUMIN 2.1*    No results for input(s): "LIPASE", "AMYLASE" in the last 168 hours. No results for input(s): "AMMONIA" in the last 168 hours.  CBC: Recent Labs  Lab 06/09/22 0500 06/10/22 0517 06/11/22 0416 06/12/22 1119 06/12/22 1650  WBC 17.9* 17.6* 15.5* 15.8* 14.6*  NEUTROABS  --   --   --   --  13.2*  HGB 9.6* 9.1* 8.9* 8.0* 7.1*  HCT 30.2* 28.5* 27.8* 25.4* 22.3*  MCV 79.7* 78.3* 78.3* 79.9* 80.8  PLT 349 326 281 385 292     Cardiac Enzymes: No results for input(s): "CKTOTAL", "CKMB", "CKMBINDEX", "TROPONINI" in the last 168 hours.  BNP: Invalid input(s): "POCBNP"  CBG: Recent Labs  Lab 06/06/22 1423 06/08/22 1028 06/12/22 1229 06/12/22 1344  GLUCAP 113* 129* 116* 146*     Microbiology: Results for orders placed or performed during the hospital encounter of 06/02/22  Blood culture (routine x 2)     Status: None   Collection Time: 06/02/22  4:24 PM   Specimen: BLOOD  Result Value Ref Range Status   Specimen Description BLOOD LEFT ANTECUBITAL  Final   Special Requests  Final    BOTTLES DRAWN AEROBIC AND ANAEROBIC Blood Culture results may not be optimal due to an inadequate volume of blood received in culture bottles   Culture   Final    NO GROWTH 5 DAYS Performed at [facility], [address] [place], [place] [address]    Report Status 06/07/2022 FINAL  Final  Blood culture (routine x 2)     Status: None   Collection Time: 06/02/22  8:45 PM   Specimen: BLOOD LEFT HAND  Result Value Ref Range Status   Specimen Description BLOOD LEFT HAND  Final   Special Requests   Final    BOTTLES DRAWN  AEROBIC AND ANAEROBIC Blood Culture adequate volume   Culture   Final    NO GROWTH 5 DAYS Performed at [facility], [address] [place], [place] [address]    Report Status 06/07/2022 FINAL  Final  Culture, blood (Routine X 2) w Reflex to ID Panel     Status: None (Preliminary result)   Collection Time: 06/12/22 11:20 AM   Specimen: BLOOD  Result Value Ref Range Status   Specimen Description BLOOD BLOOD RIGHT HAND  Final   Special Requests   Final    BOTTLES DRAWN AEROBIC AND ANAEROBIC Blood Culture adequate volume   Culture   Final    NO GROWTH < 24 HOURS Performed at [facility], [address] [place], [place] [address]    Report Status PENDING  Incomplete  MRSA Next Gen by PCR, Nasal     Status: None   Collection Time: 06/12/22 12:34 PM   Specimen: Nasal Mucosa; Nasal Swab  Result Value Ref Range Status   MRSA by PCR Next Gen NOT DETECTED NOT DETECTED Final    Comment: (NOTE) The GeneXpert MRSA Assay (FDA approved for NASAL specimens only), is one component of a comprehensive MRSA colonization surveillance program. It is not intended to diagnose MRSA infection nor to guide or monitor treatment for MRSA infections. Test performance is not FDA approved in patients less than 83 years old. Performed at [facility], [address] [place], [place] [address]   Culture, blood (Routine X 2) w Reflex to ID Panel     Status: None (Preliminary result)   Collection Time: 06/12/22 12:36 PM   Specimen: BLOOD RIGHT HAND  Result Value Ref Range Status   Specimen Description BLOOD RIGHT HAND  Final   Special Requests   Final    BOTTLES DRAWN AEROBIC AND ANAEROBIC Blood Culture adequate volume   Culture   Final    NO GROWTH < 24 HOURS Performed at [facility], [address], [place], [place] [address]    Report Status PENDING  Incomplete    Coagulation Studies: No results for input(s): "LABPROT", "INR" in the last 72  hours.  Urinalysis: No results for input(s): "COLORURINE", "LABSPEC", "PHURINE", "GLUCOSEU", "HGBUR", "BILIRUBINUR", "KETONESUR", "PROTEINUR", "UROBILINOGEN", "NITRITE", "LEUKOCYTESUR" in the last 72 hours.  Invalid input(s): "APPERANCEUR"    Imaging: CT HEAD WO CONTRAST (5MM)  Result Date: 06/12/2022 CLINICAL DATA:  Altered mental status EXAM: CT HEAD WITHOUT CONTRAST TECHNIQUE: Contiguous axial images were obtained from the base of the skull through the vertex without intravenous contrast. RADIATION DOSE REDUCTION: This exam was performed according to the departmental dose-optimization program which includes automated exposure control, adjustment of the mA and/or kV according to patient size and/or use of iterative reconstruction technique. COMPARISON:  04/20/2022 FINDINGS: Brain: No evidence of acute infarction, hemorrhage, hydrocephalus, extra-axial  collection or mass lesion/mass effect. Periventricular and deep white matter hypodensity. Calcification of the bilateral globi pallidi as well as striated vascular calcification of the bilateral corona radiata unchanged (series 3, image 13, 21). Vascular: No hyperdense vessel or unexpected calcification. Skull: Normal. Negative for fracture or focal lesion. Sinuses/Orbits: No acute finding. Other: Extensive vascular calcinosis about the included head and neck. IMPRESSION: No acute intracranial pathology. Small-vessel white matter disease. Electronically Signed   By: Delanna Ahmadi M.D.   On: 06/12/2022 16:06   DG Chest Port 1 View  Result Date: 06/12/2022 CLINICAL DATA:  Sepsis, CHF, COPD EXAM: PORTABLE CHEST 1 VIEW COMPARISON:  Portable exam 0915 hours compared to 09/25/2021; correlation CT angio head/neck 10/29/2016 FINDINGS: Dual lumen central venous catheter via infra diaphragmatic approach with tip projecting over the SVC/RIGHT atrial junction. Enlargement of cardiac silhouette post CABG and MVR. Persistent LEFT pleural effusion and compressive  atelectasis of LEFT lung. Underlying pulmonary infiltrates bilaterally. Calcified mass LEFT paratracheal, corresponding to calcified LEFT thyroid mass on prior CT from 2018. No pneumothorax or acute osseous findings. Atherosclerotic calcifications aorta with stent identified at RIGHT axillary vessels. IMPRESSION: Persistent LEFT pleural effusion and compressive atelectasis of LEFT lung. Mild diffuse pulmonary infiltrates question pulmonary edema. Mild enlargement of cardiac silhouette post CABG and MVR. Calcified LEFT thyroid mass unchanged since 10/28/2016; Stability for greater than 5 years implies benignity; no biopsy or followup indicated (ref: J Am Coll Radiol. 2015 Feb;12(2): 143-50). Electronically Signed   By: Lavonia Dana M.D.   On: 06/12/2022 09:29     Medications:    levETIRAcetam Stopped (06/12/22 2240)   levETIRAcetam 500 mg (06/13/22 0939)   norepinephrine (LEVOPHED) Adult infusion 2 mcg/min (06/12/22 1529)   piperacillin-tazobactam (ZOSYN)  IV 2.25 g (06/13/22 0529)   vasopressin Stopped (06/12/22 2215)    (feeding supplement) PROSource Plus  30 mL Oral TID BM   apixaban  2.5 mg Oral BID   vitamin C  500 mg Oral BID   atorvastatin  40 mg Oral Daily   Chlorhexidine Gluconate Cloth  6 each Topical Daily   Chlorhexidine Gluconate Cloth  6 each Topical Once   DULoxetine  40 mg Oral BID   epoetin (EPOGEN/PROCRIT) injection  4,000 Units Intravenous Q M,W,F-HD   feeding supplement (NEPRO CARB STEADY)  237 mL Oral TID BM   midodrine  10 mg Oral TID WC   multivitamin  1 tablet Oral QHS   mouth rinse  15 mL Mouth Rinse 4 times per day   pantoprazole  40 mg Oral Daily   polyethylene glycol  17 g Oral Daily   senna-docusate  1 tablet Oral QHS   sevelamer carbonate  800 mg Oral TID WC   zinc sulfate  220 mg Oral Daily   acetaminophen, haloperidol lactate, HYDROmorphone (DILAUDID) injection, ondansetron **OR** ondansetron (ZOFRAN) IV, ondansetron (ZOFRAN) IV, mouth rinse,  oxyCODONE-acetaminophen  Assessment/ Plan:  Mr. DONYEL NESTER is a 76 y.o.  male  with past medical conditions including CAD, COPD, CHF, diabetes, stroke with late seizures, hypertension, and end-stage renal disease on hemodialysis.  Patient presents to the emergency department from [facility] with concerns of left foot wound and has been admitted for 06/02/2022  3:25 PM   [facility]/MWF/right thigh PermCath  End-stage renal disease on hemodialysis.   Patient scheduled for dialysis later today. Will monitor discharge plan to determine changes in outpatient clinic.  Family also considering transition to comfort care.  2. Anemia of chronic kidney disease Lab  Results  Component Value Date   HGB 7.1 (L) 06/12/2022    Patient receives [place] outpatient.  Decreased hemoglobin today, 7.1.  Will increase EPO with dialysis.  3. Secondary Hyperparathyroidism: with outpatient labs: PTH 534, phosphorus 4.5, calcium 8.0 on 05/21/2022.   Lab Results  Component Value Date   PTH 63 10/05/2017   PTH Comment 10/05/2017   CALCIUM 9.1 06/12/2022   CAION 1.05 (L) 07/12/2020   PHOS 2.9 06/12/2022   Continue sevelamer with meals.   4.  Hypertension with chronic kidney disease.  Home regimen includes amlodipine. Blood pressure soft this morning, 85/58.  Amlodipine stopped, patient placed on midodrine 5 mg 3 times daily. Blood pressure 114/56   5.  Dry gangrene,  on left foot.  Patient was admitted with dry gangrene.  Patient is now s/p AKA Vascular surgery and primary team are following     LOS: 11 [place] 8/30/202312:49 PM

## 2022-06-13 NOTE — Progress Notes (Signed)
OT Cancellation Note  Patient Details Name: Ernest [name] MRN: [id number] DOB: 05/01/1946   Cancelled Treatment:    Reason Eval/Treat Not Completed: Medical issues which prohibited therapy. Per chart review, pt with change in medical status and transferred to ICU. Per therapy guidelines, OT to complete order at this time. Please re-consult when pt is medically able to participate in OT intervention.   Darleen Crocker, [employer], OTR/L , CBIS ascom [phone]  06/13/22, 8:41 AM

## 2022-06-13 NOTE — TOC Progression Note (Signed)
Transition of Care [facility]) - Progression Note    Patient Details  Name: Ernest [name] MRN: [id number] Date of Birth: 1946/03/01  Transition of Care [facility] CM/SW Contact  Shelbie Hutching, RN Phone Number: 06/13/2022, 10:41 AM  Clinical Narrative:    Daughter is unsure of patient returning to [facility] with Hospice and per Lorayne Bender with [facility] she wants to talk with the rest of her family about it.    Expected Discharge Plan: [place] (with hospice) Barriers to Discharge: Continued Medical Work up  Expected Discharge Plan and Services Expected Discharge Plan: [place] (with hospice)     Post Acute Care Choice: [place] Living arrangements for the past 2 months: [place]                                       Social Determinants of Health (SDOH) Interventions    Readmission Risk Interventions    06/10/2022   12:03 PM 06/28/2021   10:38 AM  Readmission Risk Prevention Plan  Transportation Screening Complete Complete  Medication Review (RN Care Manager) Complete Complete  PCP or Specialist appointment within 3-5 days of discharge Complete Complete  HRI or Home Care Consult Complete Complete  SW Recovery Care/Counseling Consult Complete Complete  Palliative Care Screening Not Applicable Not Applicable  Skilled Nursing Facility Complete Complete

## 2022-06-13 NOTE — Assessment & Plan Note (Addendum)
Persistent hypotension yesterday requiring transfer to ICU for pressors oblique..  This was likely due to aspiration pneumonia.[name]  He is weaned off pressors now and blood pressures are improving.  Continue IV antibiotics

## 2022-06-13 NOTE — Progress Notes (Signed)
HD tx for 3.5 hours, tolerated well No signs of distress   UF = 1000 ml  CVC capped and hep locked Report given to floor RN VSS bp 120/53 [id number]          Hr 107           Rr 16

## 2022-06-13 NOTE — Progress Notes (Signed)
PT Cancellation Note  Patient Details Name: Ernest [name] MRN: [id number] DOB: 01/05/1946   Cancelled Treatment:    Reason Eval/Treat Not Completed: Medical issues which prohibited therapy (Per chart review, patient transferred to CCU due to change/decline in medical status.  Per guidelines, will require new orders to resume PT services.  Initial order completed; please re-consult as medically appropriate.)  Lino Wickliff H. Owens Shark, PT, DPT, NCS 06/13/22, 8:38 AM [phone]

## 2022-06-13 NOTE — Progress Notes (Signed)
Nutrition Follow-up  DOCUMENTATION CODES:   Severe malnutrition in context of chronic illness  INTERVENTION:   -Continue renal MVI daily -Continue Nepro Shake po TID, each supplement provides 425 kcal and 19 grams protein  -Continue 30 ml Prosource Plus TID, each supplement provides 100 kcals and 15 grams protein -Continue 500 mg vitamin C BID -Continue 220 mg zinc sulfate x 14 days   NUTRITION DIAGNOSIS:   Severe Malnutrition related to chronic illness (COPD) as evidenced by severe fat depletion, severe muscle depletion.  Ongoing  GOAL:   Patient will meet greater than or equal to 90% of their needs  Progressing   MONITOR:   PO intake, Supplement acceptance  REASON FOR ASSESSMENT:   Malnutrition Screening Tool    ASSESSMENT:   Pt with medical history significant of CHF, COPD, CAD, Aflutter on eliquis, ESRD on HD MWF, DM2, h/o stroke with late seizure on keppra, HLD, HTN who presents for dry gangrene of the left foot.  8/25- s/p lt AKA 8/29- transferred to ICU due to hypotension  Reviewed I/O's: +1.8 L x 24 hours and +7.4 L since admission   Pt with variable intake. Noted meal completion 0-60%. Pt continues to intermittently refuse care and medications. He is refusing Prosource, but mostly taking his Nepro supplements.   Per TOC notes, pt family consider transitioning to SNF with hospice care.   Reviewed wt hx; wt has been stable since admission  Medications reviewed and include keppra, miralax, and senokot.   Labs reviewed: CBGS: 146.  Diet Order:   Diet Order             Diet renal/carb modified with fluid restriction Diet-HS Snack? Nothing; Fluid restriction: 1200 mL Fluid; Room service appropriate? Yes; Fluid consistency: Thin  Diet effective now                   EDUCATION NEEDS:   Not appropriate for education at this time  Skin:  Skin Assessment: Skin Integrity Issues: Skin Integrity Issues:: Incisions, Stage II Stage II:  sacrum Incisions: closed lt leg  Last BM:  06/10/22  Height:   Ht Readings from Last 1 Encounters:  06/02/22 5\' 9"  (1.753 m)    Weight:   Wt Readings from Last 1 Encounters:  06/06/22 55.9 kg    Ideal Body Weight:  61.1 kg (adjusted for bilateral AKA)  BMI:  Body mass index is 18.2 kg/m.  Estimated Nutritional Needs:   Kcal:  2200-2400  Protein:  125-150 grams  Fluid:  1000 ml + UOP    Loistine Chance, RD, LDN, [place] Registered Dietitian II Certified Diabetes Care and Education Specialist Please refer to [facility] for RD and/or RD on-call/weekend/after hours pager

## 2022-06-13 NOTE — TOC Progression Note (Signed)
Transition of Care [facility]) - Progression Note    Patient Details  Name: Ernest [name] MRN: [id number] Date of Birth: 27-Mar-1946  Transition [facility] Care [facility] CM/SW Contact  Shelbie Hutching, RN Phone Number: 06/13/2022, 8:52 AM  Clinical Narrative:    Patient to be evaluated by [facility] for hospice at [facility]  MD to speak with patient's family today about patient going back to [facility] with [facility]    Expected Discharge Plan: [place] (with hospice) Barriers to Discharge: Continued Medical Work up  Expected Discharge Plan and Services Expected Discharge Plan: [place] (with hospice)     Post Acute Care Choice: [place] Living arrangements for the past 2 months: [place]                                       Social Determinants of Health (SDOH) Interventions    Readmission Risk Interventions    06/10/2022   12:03 PM 06/28/2021   10:38 AM  Readmission Risk Prevention Plan  Transportation Screening Complete Complete  Medication Review (RN Care Manager) Complete Complete  PCP or Specialist appointment within 3-5 days of discharge Complete Complete  HRI or Home Care Consult Complete Complete  SW Recovery Care/Counseling Consult Complete Complete  Palliative Care Screening Not Applicable Not Applicable  Skilled Nursing Facility Complete Complete

## 2022-06-13 NOTE — Progress Notes (Signed)
Pt noted to have been assigned floor bed. Pt has art line in place. Notified Hassan Rowan NP that patient continues to have art line in place and that peripheral blood pressures are difficult to obtain due to bilateral aka and bilateral fistulas in UE. Pt troponins are now elevating and we are to transfuse blood. Informed brenda of this and inquired about changing patient tranfer orders. Dr Manuella Ghazi made aware of this as well. Pt transferred back to stepdown.

## 2022-06-13 NOTE — Assessment & Plan Note (Signed)
Likely due to demand ischemia with severe anemia. Will trend more.  Request cardiology consultation.  2D echo

## 2022-06-14 ENCOUNTER — Inpatient Hospital Stay (HOSPITAL_COMMUNITY)
Admit: 2022-06-14 | Discharge: 2022-06-14 | Disposition: A | Discharge status: Home / Self Care | Payer: Medicare (Managed Care) | Attending: Internal Medicine | Admitting: Internal Medicine

## 2022-06-14 DIAGNOSIS — F4329 Adjustment disorder with other symptoms: Secondary | ICD-10-CM | POA: Diagnosis not present

## 2022-06-14 DIAGNOSIS — I5023 Acute on chronic systolic (congestive) heart failure: Secondary | ICD-10-CM

## 2022-06-14 DIAGNOSIS — N186 End stage renal disease: Secondary | ICD-10-CM | POA: Diagnosis not present

## 2022-06-14 DIAGNOSIS — R778 Other specified abnormalities of plasma proteins: Secondary | ICD-10-CM | POA: Diagnosis not present

## 2022-06-14 DIAGNOSIS — D649 Anemia, unspecified: Secondary | ICD-10-CM | POA: Diagnosis not present

## 2022-06-14 DIAGNOSIS — I96 Gangrene, not elsewhere classified: Secondary | ICD-10-CM | POA: Diagnosis not present

## 2022-06-14 DIAGNOSIS — A419 Sepsis, unspecified organism: Secondary | ICD-10-CM

## 2022-06-14 DIAGNOSIS — R6521 Severe sepsis with septic shock: Secondary | ICD-10-CM | POA: Diagnosis not present

## 2022-06-14 DIAGNOSIS — F01518 Vascular dementia, unspecified severity, with other behavioral disturbance: Secondary | ICD-10-CM | POA: Diagnosis not present

## 2022-06-14 LAB — TYPE AND SCREEN
ABO/RH(D): B POS
Antibody Screen: NEGATIVE
Unit division: 0

## 2022-06-14 LAB — BPAM RBC
Blood Product Expiration Date: 202309232359
ISSUE DATE / TIME: 202308302251
Unit Type and Rh: 7300

## 2022-06-14 LAB — HEPARIN LEVEL (UNFRACTIONATED)
Heparin Unfractionated: >1.1 IU/mL — ABNORMAL HIGH (ref 0.30–0.70)
Heparin Unfractionated: >1.1 IU/mL — ABNORMAL HIGH (ref 0.30–0.70)

## 2022-06-14 LAB — ECHOCARDIOGRAM COMPLETE
AR max vel: 1.61 cm2
AV Area VTI: 1.88 cm2
AV Area mean vel: 1.73 cm2
AV Mean grad: 1 mmHg
AV Peak grad: 2.6 mmHg
Ao pk vel: 0.8 m/s
Area-P 1/2: 7.9 cm2
Calc EF: 23.1 %
Height: 69 in
MV VTI: 1.04 cm2
S' Lateral: 4.94 cm
Single Plane A2C EF: 24 %
Single Plane A4C EF: 21.9 %
Weight: 1971.79 oz

## 2022-06-14 LAB — APTT
aPTT: 51 seconds — ABNORMAL HIGH (ref 24–36)
aPTT: 70 seconds — ABNORMAL HIGH (ref 24–36)

## 2022-06-14 LAB — BASIC METABOLIC PANEL
Anion gap: 10 (ref 5–15)
BUN: 21 mg/dL (ref 8–23)
CO2: 26 mmol/L (ref 22–32)
Calcium: 8.7 mg/dL — ABNORMAL LOW (ref 8.9–10.3)
Chloride: 100 mmol/L (ref 98–111)
Creatinine, Ser: 2.2 mg/dL — ABNORMAL HIGH (ref 0.61–1.24)
GFR, Estimated: 31 mL/min — ABNORMAL LOW (ref >60)
Glucose, Bld: 109 mg/dL — ABNORMAL HIGH (ref 70–99)
Potassium: 3.1 mmol/L — ABNORMAL LOW (ref 3.5–5.1)
Sodium: 136 mmol/L (ref 135–145)

## 2022-06-14 LAB — CBC
HCT: 28.2 % — ABNORMAL LOW (ref 39.0–52.0)
Hemoglobin: 9.2 g/dL — ABNORMAL LOW (ref 13.0–17.0)
MCH: 26.4 pg (ref 26.0–34.0)
MCHC: 32.6 g/dL (ref 30.0–36.0)
MCV: 80.8 fL (ref 80.0–100.0)
Platelets: 295 10*3/uL (ref 150–400)
RBC: 3.49 MIL/uL — ABNORMAL LOW (ref 4.22–5.81)
RDW: 19.6 % — ABNORMAL HIGH (ref 11.5–15.5)
WBC: 12.7 10*3/uL — ABNORMAL HIGH (ref 4.0–10.5)
nRBC: 0 % (ref 0.0–0.2)

## 2022-06-14 MED ORDER — PANTOPRAZOLE SODIUM 40 MG IV SOLR
40.0000 mg | INTRAVENOUS | Status: DC
Start: 2022-06-14 — End: 2022-06-21
  Administered 2022-06-14 – 2022-06-20 (×7): 40 mg via INTRAVENOUS
  Filled 2022-06-14 (×6): qty 10

## 2022-06-14 MED ORDER — PIPERACILLIN-TAZOBACTAM 3.375 G IVPB
3.3750 g | Freq: Three times a day (TID) | INTRAVENOUS | Status: DC
  Administered 2022-06-14 – 2022-06-15 (×3): 3.375 g via INTRAVENOUS
  Filled 2022-06-14 (×3): qty 50

## 2022-06-14 MED ORDER — POTASSIUM CHLORIDE CRYS ER 20 MEQ PO TBCR: 40.0000 meq | EXTENDED_RELEASE_TABLET | Freq: Once | ORAL | Status: DC

## 2022-06-14 MED ORDER — ACETAMINOPHEN 10 MG/ML IV SOLN: 1000.0000 mg | Freq: Four times a day (QID) | INTRAVENOUS | Status: AC | PRN

## 2022-06-14 MED ORDER — POTASSIUM CHLORIDE 10 MEQ/100ML IV SOLN
10.0000 meq | INTRAVENOUS | Status: AC
  Administered 2022-06-14 (×4): 10 meq via INTRAVENOUS
  Filled 2022-06-14 (×4): qty 100

## 2022-06-14 MED ORDER — HEPARIN (PORCINE) 25000 UT/250ML-% IV SOLN
800.0000 [IU]/h | INTRAVENOUS | Status: DC
  Administered 2022-06-14: 800 [IU]/h via INTRAVENOUS
  Filled 2022-06-14: qty 250

## 2022-06-14 NOTE — Consult Note (Signed)
ANTICOAGULATION CONSULT NOTE - Follow Up Consult  Pharmacy Consult for Eliquis > Heparin gtt Indication:  Atrial Flutter (PTA Elquis)  [name]  Patient Measurements: Height: 5\' 9"  (175.3 cm) Weight: 55.9 kg (123 lb 3.8 oz) IBW/kg (Calculated) : 70.7 Heparin Dosing Weight: 57.9kg  Vital Signs: Temp: 97.4 F (36.3 C) (08/31 0800) Temp Source: Axillary (08/31 0800) Pulse Rate: 103 (08/31 0900)  Labs: Recent Labs    06/12/22 1119 06/12/22 1644 06/12/22 1650 06/12/22 1950 06/13/22 1954 06/13/22 2137 06/14/22 0525  HGB 8.0*  --  7.1*  --   --   --  9.2*  HCT 25.4*  --  22.3*  --   --   --  28.2*  PLT 385  --  292  --   --   --  295  CREATININE 3.19*  --   --   --   --   --  2.20*  TROPONINIHS 577*   < >  --  1,505* 2,114* 2,025*  --    < > = values in this interval not displayed.    Estimated Creatinine Clearance: 23.6 mL/min (A) (by C-G formula based on SCr of 2.2 mg/dL (H)).   Medications: NKDA & Heparin Dosing Weight: 57.9kg PTA: eliquis 2.5mg  BID Inpatient: eliquis 2.5mg  BID (last dose 8/30 PM) >> Heparin gtt (8/31 AM>>  Assessment: 76yo M w/ h/o CAD (CABG), HTN, Dementia, Aflutter (on elquis), MV repair Nov'19, PAD, ESRD-HD, stroke c/cb seizures, presenting 06/02/2022 for dry gangrene of the foot (s/p amputation), agreeing to dialysis August 23, left AKA August 25, August 29 transferred to ICU for septic shock & hypotension 2/2 PNA requiring pressors initially now weaned off pressors. Repeat ECHO with severely depressed EF of 20-25%; GHK.  Date Time aPTT/HL Rate/Comment       Baseline Labs: aPTT - pending; INR - pending Hgb - 11.1>9.6>7.1>trf>9.2; Plts - 269>306 Trop - 6712>4580  Goal of Therapy:  Heparin level 0.3-0.7 units/ml aPTT 66-102 seconds Monitor platelets by anticoagulation protocol: Yes   Plan:  Last dose of eliquis 2.5mg  given 8/30 2234 ISO dec'd renal fxn (CrCl 23.30ml/min). Anticipate lab interference to persist, dose heparin by aPTT  until correlation.  NOTE: NO BOLUSES per Cards ISO anemia req'ing transfusion. No bolus; Initiate heparin infusion at 800 units/hr Check aPTT/Anti-Xa level in 8 hours and daily once consecutively therapeutic.  Titrate by aPTT's until lab correlation is noted, then titrate by anti-xa alone. Continue to monitor H&H and platelets daily while on heparin gtt.  Shanon Brow Binnie Vonderhaar 06/14/2022,11:15 AM

## 2022-06-14 NOTE — Assessment & Plan Note (Signed)
Wound care consultation.  Stage II gluteal cleft.

## 2022-06-14 NOTE — Assessment & Plan Note (Signed)
Dialysis patient

## 2022-06-14 NOTE — Consult Note (Signed)
Cardiology Consultation   Patient ID: Ernest [name] MRN: [id number]; DOB: 08/04/1946  Admit date: 06/02/2022 Date of Consult: 06/14/2022  PCP:  Brayton Mars, [facility] Prov[facility] Cardiologist:  Nelva Bush, MD   {  Patient Profile:   Ernest [name] is a 76 y.o. male with a hx of CAD s/p CABG (LIMA-LAD, sequential SVG-ramus-OM, and SVG-acute marginal) and mitral valve repair Oletta Lamas Physio II 65mm annuloplasty ring) in 08/2017, chronic HFrEF with recovered LVEF, persistent Aflutter, NSVT, ESRD, stroke complicated by seizures, PAD complicated by gangrene and sepsis leading to R AKA 06/2021, HTN, HLD, DM2, and COPD who is being seen 06/14/2022 for the evaluation of elevated troponin at the request of Dr. Manuella Ghazi.  History of Present Illness:   Ernest [name] is followed by Dr. Saunders Revel for the above cardiac issues. History obtained from chart review. He had a LHC in 08/2017 that demonstrated multivessel CAD as outlined below with recommendation to pursue CABG. Echo at that time showed EF 45-50% which was similar to echo from 2016, inferobasal and posterior lateral HK , mild MR and PASP of 25mmHG. He subsequently underwent 4V CABG and mitral valve repair.   He was last seen 02/2022 and was overall stable from a cardiac perspective. There was a chronic issue of subtherapeutic INR being followed by the nursing home. He was switched to Eliquis.   Patient was admitted 8/19 for gangrene of the foot. HE was previously refusing dialysis. He saw psych who deemed him able to make his on medical decisions. Patient finally agreed to dialysis 8/23. Vascular is following and patient underwent left AKA on 8/25. On 8/29 the patient was transferred to the ICU requiring pressors for septic shock suspected secondary to PNA. On 8/30 patient was weaned off pressures and family is discussing hospice care.    Past Medical History:  Diagnosis Date   Abnormal stress test    s/p cath November 2013  with modest disease involving the ostial left main, proximal LAD, proximal RCA - do not appear to be hemodynamically signficant; mild LV dysfunction   Anemia    low iron   Anxiety    Arthritis    Bacteremia    Chronic systolic CHF (congestive heart failure) (HCC)    COPD (chronic obstructive pulmonary disease) ([place])    Coronary artery disease    a. per cath report 2013, b. 09/13/2017-CABG X4 & Mitral valve repair   Depression    DVT (deep venous thrombosis) (HCC)    arm - right- finished blood thinner- not sure when it   Ejection fraction < 50%    Erectile dysfunction    penile implant   ESRD (end stage renal disease) ([place])    [facility] on a MWF schedule.      Gout    Hx: of   Hepatitis C    History of seizures    last one 2018   Hyperlipidemia    Hypertension    Mitral valve disease    s/p Mitral repair 09/13/17   Obesity    Retinopathy    Shortness of breath    Hx: of with exertion   Stroke [facility])    "several" left side weakness, speech affected   Type II or unspecified type diabetes mellitus without mention of complication, not stated as uncontrolled    adult onset    Past Surgical History:  Procedure Laterality Date   A/V FISTULAGRAM N/A 10/04/2017   Procedure: A/V FISTULAGRAM;  Surgeon: Oneida Alar,  Jessy Oto, MD;  Location: [place] [facility];  Service: Cardiovascular;  Laterality: N/A;   A/V SHUNTOGRAM Left 07/12/2020   Procedure: A/V SHUNTOGRAM;  Surgeon: Serafina Mitchell, MD;  Location: [place] [facility];  Service: Cardiovascular;  Laterality: Left;   AMPUTATION Right 07/08/2021   Procedure: AMPUTATION ABOVE KNEE;  Surgeon: Elmore Guise, MD;  Location: [facility];  Service: Vascular;  Laterality: Right;   AMPUTATION Left 06/08/2022   Procedure: AMPUTATION ABOVE KNEE;  Surgeon: Algernon Huxley, MD;  Location: [facility];  Service: Vascular;  Laterality: Left;   ANGIOPLASTY Left 09/24/2019   Procedure: Angioplasty Innominate Vien;  Surgeon: Serafina Mitchell, MD;  Location:  [place];  Service: Vascular;  Laterality: Left;   AV FISTULA PLACEMENT Left 01/13/2013   Procedure: INSERTION OF ARTERIOVENOUS (AV) GORE-TEX GRAFT ARM;  Surgeon: Angelia Mould, MD;  Location: [place];  Service: Vascular;  Laterality: Left;   AV FISTULA PLACEMENT Left 05/05/2013   Procedure: INSERTION OF LEFT UPPER ARM  ARTERIOVENOUS GORTEX GRAFT;  Surgeon: Angelia Mould, MD;  Location: [place];  Service: Vascular;  Laterality: Left;   AV FISTULA PLACEMENT Left 11/26/2019   Procedure: INSERTION OF ARTERIOVENOUS (AV) GORE-TEX GRAFT, left  ARM;  Surgeon: Serafina Mitchell, MD;  Location: [place];  Service: Vascular;  Laterality: Left;   [place] REMOVAL Left 03/26/2013   Procedure: REMOVAL OF  NON INCORPORATED ARTERIOVENOUS GORETEX GRAFT ([place]) left arm * repair of  left brachial artery with vein patch angioplasty.;  Surgeon: Angelia Mould, MD;  Location: [place];  Service: Vascular;  Laterality: Left;   CARDIAC CATHETERIZATION  August 26, 2012   CATARACT EXTRACTION W/ INTRAOCULAR LENS  IMPLANT, BILATERAL     COLONOSCOPY     CORONARY ARTERY BYPASS GRAFT N/A 09/13/2017   Procedure: CORONARY ARTERY BYPASS GRAFTING (CABG) times four LIMA  to LAD, SVG sequentially to OM and RAMUS Intermediate and SVG to Acute Marginal;  Surgeon: Melrose Nakayama, MD;  Location: [place];  Service: Open Heart Surgery;  Laterality: N/A;   DIALYSIS/PERMA CATHETER INSERTION N/A 02/28/2021   Procedure: DIALYSIS/PERMA CATHETER EXCHANGE;  Surgeon: Katha Cabal, MD;  Location: [place] [facility];  Service: Cardiovascular;  Laterality: N/A;   EMBOLECTOMY Right 08/27/2013   Procedure: EMBOLECTOMY;  Surgeon: Mal Misty, MD;  Location: [place];  Service: Vascular;  Laterality: Right;  Thrombectomy of Radial and ulnar artery.   ENDOVASCULAR STENT INSERTION Left 09/24/2019   Procedure: Endovascular Stent Graft Insertion, Arm Graft;  Surgeon: Serafina Mitchell, MD;  Location: [facility] OR;  Service: Vascular;  Laterality:  Left;   EYE SURGERY     laser.  and surgery for DM   fistula     RUE and wrist   FISTULOGRAM Left 09/24/2019   Procedure: SHUNTOGRAM OF ARTERIOVENOUS FISTULA LEFT ARM,;  Surgeon: Serafina Mitchell, MD;  Location: [place];  Service: Vascular;  Laterality: Left;   INSERTION OF DIALYSIS CATHETER Right 01/01/2013   Procedure: INSERTION OF DIALYSIS CATHETER right  internal jugular;  Surgeon: Serafina Mitchell, MD;  Location: [place];  Service: Vascular;  Laterality: Right;   INSERTION OF DIALYSIS CATHETER N/A 03/26/2013   Procedure: INSERTION OF DIALYSIS CATHETER Left internal jugular vein;  Surgeon: Angelia Mould, MD;  Location: [place];  Service: Vascular;  Laterality: N/A;   INSERTION OF DIALYSIS CATHETER Right 11/26/2019   Procedure: INSERTION OF DIALYSIS CATHETER, right internal jugular;  Surgeon: Serafina Mitchell, MD;  Location: [place];  Service: Vascular;  Laterality: Right;   IR THORACENTESIS ASP PLEURAL SPACE W/IMG GUIDE  09/30/2019   LEFT HEART CATH AND CORONARY ANGIOGRAPHY N/A 09/09/2017   Procedure: LEFT HEART CATH AND CORONARY ANGIOGRAPHY;  Surgeon: Troy Sine, MD;  Location: [place] [facility];  Service: Cardiovascular;  Laterality: N/A;   LIGATION OF ARTERIOVENOUS  FISTULA Right 12/30/2013   Procedure: REMOVAL OF SEGMENT OF GORTEX GRAFT AND FISTULA  AND REPAIR OF BRACHIAL ARTERY;  Surgeon: Mal Misty, MD;  Location: [place];  Service: Vascular;  Laterality: Right;   LOWER EXTREMITY ANGIOGRAPHY N/A 06/28/2021   Procedure: Lower Extremity Angiography;  Surgeon: Katha Cabal, MD;  Location: [place] [facility];  Service: Cardiovascular;  Laterality: N/A;   LOWER EXTREMITY ANGIOGRAPHY Left 11/23/2021   Procedure: LOWER EXTREMITY ANGIOGRAPHY;  Surgeon: Algernon Huxley, MD;  Location: [place] [facility];  Service: Cardiovascular;  Laterality: Left;   MITRAL VALVE REPAIR N/A 09/13/2017   Procedure: MITRAL VALVE REPAIR (MVR);  Surgeon: Melrose Nakayama, MD;  Location: [place];   Service: Open Heart Surgery;  Laterality: N/A;   MITRAL VALVE REPAIR (MV)/CORONARY ARTERY BYPASS GRAFTING (CABG)  09/13/2017   PENILE PROSTHESIS IMPLANT     1997.. no card   PERIPHERAL VASCULAR BALLOON ANGIOPLASTY Left 10/04/2017   Procedure: PERIPHERAL VASCULAR BALLOON ANGIOPLASTY;  Surgeon: Elam Dutch, MD;  Location: [place] [facility];  Service: Cardiovascular;  Laterality: Left;  AVF   PERIPHERAL VASCULAR BALLOON ANGIOPLASTY Left 07/12/2020   Procedure: PERIPHERAL VASCULAR BALLOON ANGIOPLASTY;  Surgeon: Serafina Mitchell, MD;  Location: [place] [facility];  Service: Cardiovascular;  Laterality: Left;  ARM SHUNTOGRAM   REVISION OF ARTERIOVENOUS GORETEX GRAFT Left 09/24/2019   Procedure: REVISION OF ARTERIOVENOUS GORETEX GRAFT LEFT ARM;  Surgeon: Serafina Mitchell, MD;  Location: [facility];  Service: Vascular;  Laterality: Left;   TEE WITHOUT CARDIOVERSION N/A 06/11/2013   Procedure: TRANSESOPHAGEAL ECHOCARDIOGRAM (TEE);  Surgeon: Larey Dresser, MD;  Location: [facility] ENDOSCOPY;  Service: Cardiovascular;  Laterality: N/A;   TEE WITHOUT CARDIOVERSION N/A 09/13/2017   Procedure: TRANSESOPHAGEAL ECHOCARDIOGRAM (TEE);  Surgeon: Melrose Nakayama, MD;  Location: [facility];  Service: Open Heart Surgery;  Laterality: N/A;   THROMBECTOMY W/ EMBOLECTOMY Left 03/26/2013   Procedure: Attempted thrombectomy of left arm arteriovenous goretex graft.;  Surgeon: Angelia Mould, MD;  Location: [facility] OR;  Service: Vascular;  Laterality: Left;   ULTRASOUND GUIDANCE FOR VASCULAR ACCESS Bilateral 09/24/2019   Procedure: Ultrasound Guidance For Vascular Access, Right Femoral Vein and Left Arm Graft;  Surgeon: Serafina Mitchell, MD;  Location: [facility] OR;  Service: Vascular;  Laterality: Bilateral;   VENOGRAM N/A 04/07/2013   Procedure: VENOGRAM;  Surgeon: Serafina Mitchell, MD;  Location: [facility] CATH LAB;  Service: Cardiovascular;  Laterality: N/A;     Home Medications:  Prior to Admission medications   Medication Sig Start  Date End Date Taking? Authorizing Provider  amLODipine (NORVASC) 5 MG tablet Take 1 tablet (5 mg total) by mouth daily. 09/28/21  Yes Fritzi Mandes, MD  apixaban (ELIQUIS) 5 MG TABS tablet Take 1 tablet (5 mg total) by mouth 2 (two) times daily. 03/09/22 06/02/22 Yes End, Harrell Gave, MD  atorvastatin (LIPITOR) 40 MG tablet Take 40 mg by mouth at bedtime.   Yes [provider]  DULoxetine (CYMBALTA) 20 MG capsule Take 40 mg by mouth 2 (two) times daily.   Yes [provider]  levETIRAcetam (KEPPRA) 500 MG tablet Take 1 tablet (500 mg  total) by mouth 3 (three) times daily. On MWF - administer lunchtime dose once returns from dialysis 10/26/21  Yes Jaffe, Adam R, DO  mirtazapine (REMERON) 7.5 MG tablet Take 7.5 mg by mouth daily. 05/31/22  Yes [provider]  multivitamin (RENA-VIT) TABS tablet Take 1 tablet by mouth at bedtime. 09/28/21  Yes Fritzi Mandes, MD  pantoprazole (PROTONIX) 40 MG tablet Take 1 tablet (40 mg total) by mouth daily. Patient taking differently: Take 40 mg by mouth at bedtime. 07/21/21  Yes Swayze, Ava, DO  polyethylene glycol (MIRALAX / GLYCOLAX) 17 g packet Take 17 g by mouth daily. Dissolve in 4-8 ounces of water   Yes [provider]  senna-docusate (SENOKOT-S) 8.6-50 MG tablet Take 1 tablet by mouth at bedtime.   Yes [provider]  acetaminophen (TYLENOL) 500 MG tablet Take 1,000 mg by mouth every 6 (six) hours as needed for mild pain.    [provider]  levETIRAcetam (KEPPRA) 500 MG tablet Take 1 tablet (500 mg total) by mouth 2 (two) times daily. Only on Tuesday, Thursday, Saturday, Sunday Patient not taking: Reported on 06/02/2022 10/26/21   Pieter Partridge, DO  sevelamer carbonate (RENVELA) 800 MG tablet Take 800 mg by mouth 3 (three) times daily with meals. On Monday, Wednesday, and Friday. Give NOON dose on MWF asap when returning from dialysis Patient not taking: Reported on 06/02/2022    [provider]     Inpatient Medications: Scheduled Meds:  (feeding supplement) PROSource Plus  30 mL Oral TID BM   apixaban  2.5 mg Oral BID   vitamin C  500 mg Oral BID   atorvastatin  40 mg Oral Daily   Chlorhexidine Gluconate Cloth  6 each Topical Daily   Chlorhexidine Gluconate Cloth  6 each Topical Once   DULoxetine  40 mg Oral BID   epoetin (EPOGEN/PROCRIT) injection  4,000 Units Intravenous Q M,W,F-HD   feeding supplement (NEPRO CARB STEADY)  237 mL Oral TID BM   midodrine  10 mg Oral TID WC   multivitamin  1 tablet Oral QHS   mouth rinse  15 mL Mouth Rinse 4 times per day   pantoprazole  40 mg Oral Daily   polyethylene glycol  17 g Oral Daily   senna-docusate  1 tablet Oral QHS   sevelamer carbonate  800 mg Oral TID WC   zinc sulfate  220 mg Oral Daily   Continuous Infusions:  levETIRAcetam Stopped (06/12/22 2240)   levETIRAcetam Stopped (06/13/22 2257)   norepinephrine (LEVOPHED) Adult infusion 2 mcg/min (06/12/22 1529)   piperacillin-tazobactam (ZOSYN)  IV 2.25 g (06/14/22 0526)   vasopressin Stopped (06/12/22 2215)   PRN Meds: acetaminophen, haloperidol lactate, HYDROmorphone (DILAUDID) injection, ondansetron **OR** ondansetron (ZOFRAN) IV, ondansetron (ZOFRAN) IV, mouth rinse, oxyCODONE-acetaminophen  Allergies:   [name]  Social History:   Social History   Socioeconomic History   Marital status: Widowed    Spouse name: Not on file   Number of children: Not on file   Years of education: Not on file   Highest education level: Not on file  Occupational History   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  Vaping Use  Vaping Use: Never used  Substance and Sexual Activity   Alcohol use: No    Alcohol/week: 0.0 standard drinks of alcohol    Drug use: No   Sexual activity: Not on file  Other Topics Concern   Not on file  Social History Narrative   Adopted mentally retarded child at home, 2 adopted children, 3 living and 2 deceased.   Social Determinants of Health   Financial Resource Strain: Not on file  Food Insecurity: Not on file  Transportation Needs: Not on file  Physical Activity: Not on file  Stress: Not on file  Social Connections: Not on file  Intimate Partner Violence: Not on file    Family History:    Family History  Problem Relation Age of Onset   Heart disease Father    Diabetes Sister    Diabetes Brother    Diabetes Son      ROS:  Please see the history of present illness.   All other ROS reviewed and negative.     Physical Exam/Data:   Vitals:   06/14/22 0530 06/14/22 0600 06/14/22 0630 06/14/22 0800  BP:      Pulse: (!) 105 (!) 101 100 (!) 107  Resp: 12 14 13 17   Temp:    (!) 97.4 F (36.3 C)  TempSrc:    Axillary  SpO2: 100% 100% 100% 100%  Weight:      Height:        Intake/Output Summary (Last 24 hours) at 06/14/2022 0826 Last data filed at 06/14/2022 0400 Gross per 24 hour  Intake 1067.5 ml  Output 1000 ml  Net 67.5 ml      06/06/2022   12:30 PM 06/06/2022    8:09 AM 06/02/2022    8:26 PM  Last 3 Weights  Weight (lbs) 123 lb 3.8 oz 125 lb 7.1 oz 127 lb 10.3 oz  Weight (kg) 55.9 kg 56.9 kg 57.9 kg     Body mass index is 18.2 kg/m.  General:  frail AAA, awake with minimal response HEENT: normal Neck: no JVD Vascular: No carotid bruits; Distal pulses 2+ bilaterally Cardiac:  normal S1, S2; tachycardic; no murmur  Lungs:  clear to auscultation bilaterally, no wheezing, rhonchi or rales  Abd: soft, nontender, no hepatomegaly  Ext: no edema Musculoskeletal:  b/l AKA Skin: warm and dry  Neuro:  CNs 2-12 intact, no focal abnormalities noted Psych:  Normal affect   EKG:  The EKG was personally reviewed and demonstrates: ST 101 bpm, PAC, LVH no significant ST/T wave  changes Telemetry:  Telemetry was personally reviewed and demonstrates:   ST HR around 100, up to 150s, PVCs, 3 beats NSVT  Relevant CV Studies:  Echo ordered  Echo 06/2021  1. Left ventricular ejection fraction, by estimation, is 50 to 55%. The  left ventricle has low normal function. Left ventricular endocardial  border not optimally defined to evaluate regional wall motion. There is  moderate left ventricular hypertrophy.  Left ventricular diastolic parameters are consistent with Grade II  diastolic dysfunction (pseudonormalization). Elevated left atrial  pressure.   2. Right ventricular systolic function is normal. The right ventricular  size is normal. Tricuspid regurgitation signal is inadequate for assessing  PA pressure.   3. Left atrial size was mildly dilated.   4. Right atrial size was mildly dilated.   5. The mitral valve is degenerative. Trivial mitral valve regurgitation.  No evidence of mitral stenosis. Moderate to severe mitral annular  calcification.   6. The aortic  valve is tricuspid. There is mild calcification of the  aortic valve. There is mild thickening of the aortic valve. Aortic valve  regurgitation is not visualized. Mild to moderate aortic valve  sclerosis/calcification is present, without any  evidence of aortic stenosis.   7. Aortic dilatation noted. There is borderline dilatation of the aortic  root, measuring 38 mm.   8. The inferior vena cava is normal in size with <50% respiratory  variability, suggesting right atrial pressure of 8 mmHg.   LHC 2018 Ost LM lesion is 65% stenosed. Mid LM lesion is 90% stenosed. Ost Cx to Prox Cx lesion is 100% stenosed. Ost LAD lesion is 20% stenosed. Acute Mrg lesion is 60% stenosed.   Significant coronary calcification with high-grade 65% ostial and 90-95% distal left main stenosis, calcification of the proximal LAD with 20-25% narrowing, large ramus intermediate vessel; total occlusion of the left circumflex at  the ostium.  Dominant RCA with 60% ostial proximal narrowing in an RV marginal branch and collateralization to the distal circumflex.   LVEDP 14 mm Hg.   RECOMMENDATION: Surgical consultation for CABG revascularization.     Recent Labs  Lab 06/12/22 1119 06/12/22 1644 06/12/22 1950 06/13/22 1954 06/13/22 2137  TROPONINIHS 577* 1,183* 1,505* 2,114* 2,025*     Chemistry Recent Labs  Lab 06/09/22 0500 06/11/22 0924 06/12/22 1119 06/14/22 0525  NA 136 132* 136 136  K 3.9 4.6 4.4 3.1*  CL 100 95* 101 100  CO2 28 27 27 26   GLUCOSE 128* 187* 120* 109*  BUN 24* 49* 33* 21  CREATININE 2.85* 4.33* 3.19* 2.20*  CALCIUM 8.5* 8.7* 9.1 8.7*  MG 2.0  --  1.9  --   GFRNONAA 23* 14* 20* 31*  ANIONGAP 8 10 8 10     Recent Labs  Lab 06/11/22 0924  ALBUMIN 2.1*   Lipids No results for input(s): "CHOL", "TRIG", "HDL", "LABVLDL", "LDLCALC", "CHOLHDL" in the last 168 hours.  Hematology Recent Labs  Lab 06/12/22 1119 06/12/22 1650 06/14/22 0525  WBC 15.8* 14.6* 12.7*  RBC 3.18* 2.76* 3.49*  HGB 8.0* 7.1* 9.2*  HCT 25.4* 22.3* 28.2*  MCV 79.9* 80.8 80.8  MCH 25.2* 25.7* 26.4  MCHC 31.5 31.8 32.6  RDW 20.3* 19.9* 19.6*  PLT 385 292 295   Thyroid No results for input(s): "TSH", "FREET4" in the last 168 hours.  BNP Recent Labs  Lab 06/12/22 1119  BNP >4,500.0*    DDimer No results for input(s): "DDIMER" in the last 168 hours.   Radiology/Studies:  CT HEAD WO CONTRAST (5MM)  Result Date: 06/12/2022 CLINICAL DATA:  Altered mental status EXAM: CT HEAD WITHOUT CONTRAST TECHNIQUE: Contiguous axial images were obtained from the base of the skull through the vertex without intravenous contrast. RADIATION DOSE REDUCTION: This exam was performed according to the departmental dose-optimization program which includes automated exposure control, adjustment of the mA and/or kV according to patient size and/or use of iterative reconstruction technique. COMPARISON:  04/20/2022 FINDINGS:  Brain: No evidence of acute infarction, hemorrhage, hydrocephalus, extra-axial collection or mass lesion/mass effect. Periventricular and deep white matter hypodensity. Calcification of the bilateral globi pallidi as well as striated vascular calcification of the bilateral corona radiata unchanged (series 3, image 13, 21). Vascular: No hyperdense vessel or unexpected calcification. Skull: Normal. Negative for fracture or focal lesion. Sinuses/Orbits: No acute finding. Other: Extensive vascular calcinosis about the included head and neck. IMPRESSION: No acute intracranial pathology. Small-vessel white matter disease. Electronically Signed   By: Jamse Mead.D.  On: 06/12/2022 16:06   DG Chest Port 1 View  Result Date: 06/12/2022 CLINICAL DATA:  Sepsis, CHF, COPD EXAM: PORTABLE CHEST 1 VIEW COMPARISON:  Portable exam 0915 hours compared to 09/25/2021; correlation CT angio head/neck 10/29/2016 FINDINGS: Dual lumen central venous catheter via infra diaphragmatic approach with tip projecting over the SVC/RIGHT atrial junction. Enlargement of cardiac silhouette post CABG and MVR. Persistent LEFT pleural effusion and compressive atelectasis of LEFT lung. Underlying pulmonary infiltrates bilaterally. Calcified mass LEFT paratracheal, corresponding to calcified LEFT thyroid mass on prior CT from 2018. No pneumothorax or acute osseous findings. Atherosclerotic calcifications aorta with stent identified at RIGHT axillary vessels. IMPRESSION: Persistent LEFT pleural effusion and compressive atelectasis of LEFT lung. Mild diffuse pulmonary infiltrates question pulmonary edema. Mild enlargement of cardiac silhouette post CABG and MVR. Calcified LEFT thyroid mass unchanged since 10/28/2016; Stability for greater than 5 years implies benignity; no biopsy or followup indicated (ref: J Am Coll Radiol. 2015 Feb;12(2): 143-50). Electronically Signed   By: Lavonia Dana M.D.   On: 06/12/2022 09:29     Assessment and Plan:    Elevated troponin CAD s/p CABG and MVR in 2018 - HS troponin elevated to 2,114 now down trending - NO chest pain reported - Elevated troponin in the setting of multiple acute issues including ESRD, gangrene foot, septic shock and anemia -  Hgb down to 7.1 s/p 1 unit PRBCs. Hgb 9.2 today. Appears patient has been continued on Eliquis during admission. Monitor with anemia - Echo ordered - NO ASA With Eliquis - continue Lipitor - No BB with hypotension on midodrine - would not pursue invasive work-up at this time, esp given DNR status and possible hospice route  Persistent Aflutter H/o stroke - Eliquis 2.5mg  BID - CHADSVASC at least 7 - monitor with anemia  Dry gangrene foot - s/p left AKA 8/25 - s/p prior R AKA - per [facility]VS  ESRD - HD per nephrology  Septic shock - suspected 2/2 aspiration PNA - off pressors - midodrine 10mg  BID - IV abx per IM  Anemia - Hgb down to 7.1 s/p PRBC 1 unit - Hgb 9.2 today  For questions or updates, please contact [place] Please consult [email or web address] for contact info under    Signed, Orena Cavazos Ninfa Meeker, [name]  06/14/2022 8:26 AM

## 2022-06-14 NOTE — Assessment & Plan Note (Signed)
Hemoglobin 11.1-> 7.1->9.2 Unsure if this acute drop is due to recent procedure/surgery related blood loss Status post 1 PRBC transfusion for hemoglobin of 7.1 yesterday

## 2022-06-14 NOTE — Assessment & Plan Note (Signed)
Complicates overall prognosis. 

## 2022-06-14 NOTE — Assessment & Plan Note (Signed)
Well compensated.  Cardiology following, echo shows EF of 20 to 25% which is a drop from before when it was 50%

## 2022-06-14 NOTE — Progress Notes (Signed)
Progress Note   Patient: Ernest [name] QIW:[id number] DOB: 04-30-1946 DOA: 06/02/2022     12 DOS: the patient was seen and examined on 06/14/2022   Brief hospital course: 76 year old man with chronic systolic congestive heart failure, COPD, end-stage renal disease, hepatitis C, hypertension, stroke.  Patient brought in by family secondary to gangrene left foot.  Started empirically on antibiotics.  On 06/04/2022 patient refused dialysis and refused medications.  Patient declined amputation at this time.  Appreciate palliative care consultation and patient was made a DO NOT RESUSCITATE. Patient is seen by palliative care for poor prognosis.  8/23.  Patient was agreeable and dialyzed today. 8/25.  Left AKA 8/29: Patient was hypotensive requiring transfer to ICU for pressors 8/30: Weaned off pressor, DNR, troponin trending up 8/31: Daughter is not ready for comfort care/hospice yet and wants aggressive management   Assessment and Plan: Dry gangrene ([place]) Gangrene left foot.  Status post left AKA on 8/25.   End-stage renal disease on hemodialysis ([place]) HD per nephro  Vascular dementia ([place]) More stable for now he does get intermittent agitation.  Pressure injury of skin Wound care consultation.  Stage II gluteal cleft.  Secondary hyperparathyroidism of renal origin [facility]) Dialysis patient.  Protein-calorie malnutrition, severe Complicates overall prognosis.  Hypokalemia Replaced on admission I will not replace any further being that the patient is a dialysis patient.  Impaired fasting glucose Hemoglobin A1c low at 4.7.  Patient is not a diabetic.  Discontinue fingersticks.  Septic shock (HCC) Persistent hypotension yesterday requiring transfer to ICU/stepdown.  He is off pressors now this is likely due to aspiration pneumonia.[name]   blood pressures are stable.  Continue IV Zosyn  Anemia of chronic disease Hemoglobin 11.1-> 7.1->9.2 Unsure if this acute drop is due to recent  procedure/surgery related blood loss Status post 1 PRBC transfusion for hemoglobin of 7.1 yesterday  Atrial flutter (HCC) On heparin drip.  Rate controlled  Elevated troponin Likely due to demand ischemia from severe hypotension, tachycardia transient V. tach.  Troponin peaked 2100 and now trending down Echo showed EF dropped from 50-> 20 to 25% with global hypokinesis -Cardiology following and appreciate their input -Not a good candidate for ischemic evaluation at this current time -Heparin drip for now and hold Eliquis due to anemia    Seizure, late effect of stroke ([place]) On IV Keppra.  Chronic HFrEF (heart failure with reduced ejection fraction) (HCC) Well compensated.  Cardiology following, echo shows EF of 20 to 25% which is a drop from before when it was 50%        Subjective: Patient is lethargic and getting echocardiogram at bedside.  Denies any new issues  Physical Exam: Vitals:   06/14/22 1400 06/14/22 1500 06/14/22 1600 06/14/22 1700  BP:      Pulse: (!) 116 (!) 114  (!) 110  Resp: 14 16 15    Temp: 97.9 F (36.6 C)     TempSrc: Oral     SpO2: 100% 100%  100%  Weight:      Height:       General exam:Ill-appearing, cachectic. Respiratory system:Decreased breathing sounds without crackles. Respiratory effort normal. Cardiovascular system:Regular and tachycardic.[name] No pedal edema. Gastrointestinal system:Abdomen is benign Central nervous system:Lethargic,Nonfocal Extremities:Bilateral BKA, no s/s of infection on stump Psychiatry:Flat affect Access - right thigh permcath Data Reviewed:   Potassium 3.1, hemoglobin 9.2  Family Communication: Daughter Ernest [name] was updated over phone.  I had a is wanting to talk with TOC as she is not interested  in him going back to [facility].  She is also not quite ready for comfort care/hospice.  She is optimistic that he will improve  Disposition: Status is: Inpatient Remains inpatient appropriate because:  Needs close monitoring in the stepdown due to mental status, hypotension and now elevated troponins   Planned Discharge Destination: Skilled nursing facility    DVT prophylaxis-heparin drip Time spent: 35 minutes  Author: Max Sane, MD 06/14/2022 7:06 PM  For on call review [email or web address].

## 2022-06-14 NOTE — Assessment & Plan Note (Signed)
More stable for now he does get intermittent agitation.

## 2022-06-14 NOTE — Assessment & Plan Note (Signed)
HD per nephro

## 2022-06-14 NOTE — Assessment & Plan Note (Signed)
Gangrene left foot.  Status post left AKA on 8/25.

## 2022-06-14 NOTE — Assessment & Plan Note (Signed)
Persistent hypotension yesterday requiring transfer to ICU/stepdown.  He is off pressors now this is likely due to aspiration pneumonia.Ernest [name]   blood pressures are stable.  Continue IV Zosyn

## 2022-06-14 NOTE — Assessment & Plan Note (Signed)
On heparin drip.  Rate controlled

## 2022-06-14 NOTE — Progress Notes (Signed)
[facility]  ROUNDING NOTE   Subjective:   Ernest [name] is a 76 year old African-American male with past medical conditions including CAD, COPD, CHF, diabetes, stroke with late seizures, hypertension, and end-stage renal disease on hemodialysis.  Patient presents to the emergency department from [facility] with concerns of left foot wound and has been admitted for Dry gangrene [facility]) [I96]  Patient is known to our practice from previous admissions and receives outpatient dialysis treatments at [facility] on a MWF schedule, supervised by [facility] physicians.    Update: Patient remains critically ill. Echocardiogram being performed. Underwent hemodialysis treatment yesterday. Received blood transfusion x1 unit.   Objective:  Vital signs in last 24 hours:  Temp:  [97.4 F (36.3 C)-98.2 F (36.8 C)] 97.4 F (36.3 C) (08/31 0800) Pulse Rate:  [41-113] 107 (08/31 0800) Resp:  [11-27] 17 (08/31 0800) BP: (105-122)/(49-60) 122/52 (08/30 1828) SpO2:  [64 %-100 %] 100 % (08/31 0800) Arterial Line BP: (114-133)/(45-64) 124/56 (08/31 0800)  Weight change:  Filed Weights   06/02/22 2026 06/06/22 0809 06/06/22 1230  Weight: 57.9 kg 56.9 kg 55.9 kg    Intake/Output: I/O last 3 completed shifts: In: 1306.6 [P.O.:240; I.V.:89.1; Blood:377.5; IV Piggyback:600] Out: 1000 [Other:1000]   Intake/Output this shift:  No intake/output data recorded.  Physical Exam: General: NAD  Head: Normocephalic, atraumatic. Moist oral mucosal membranes  Eyes: Anicteric  Lungs:  Clear to auscultation, normal effort  Heart: Irregular rhythm  Abdomen:  Soft, nontender, nondistended  Extremities: No peripheral edema.  Bilateral AKA  Neurologic: Arousable  Skin: Warm, dry  Access: Right thigh PermCath    Basic Metabolic Panel: Recent Labs  Lab 06/08/22 0815 06/09/22 0500 06/11/22 0924 06/12/22 1119 06/14/22 0525  NA 136 136 132* 136 136  K 3.4* 3.9 4.6 4.4 3.1*  CL 99  100 95* 101 100  CO2 28 28 27 27 26   GLUCOSE 135* 128* 187* 120* 109*  BUN 42* 24* 49* 33* 21  CREATININE 4.57* 2.85* 4.33* 3.19* 2.20*  CALCIUM 9.0 8.5* 8.7* 9.1 8.7*  MG  --  2.0  --  1.9  --   PHOS  --   --  3.5 2.9  --      Liver Function Tests: Recent Labs  Lab 06/11/22 0924  ALBUMIN 2.1*    No results for input(s): "LIPASE", "AMYLASE" in the last 168 hours. No results for input(s): "AMMONIA" in the last 168 hours.  CBC: Recent Labs  Lab 06/10/22 0517 06/11/22 0416 06/12/22 1119 06/12/22 1650 06/14/22 0525  WBC 17.6* 15.5* 15.8* 14.6* 12.7*  NEUTROABS  --   --   --  13.2*  --   HGB 9.1* 8.9* 8.0* 7.1* 9.2*  HCT 28.5* 27.8* 25.4* 22.3* 28.2*  MCV 78.3* 78.3* 79.9* 80.8 80.8  PLT 326 281 385 292 295     Cardiac Enzymes: No results for input(s): "CKTOTAL", "CKMB", "CKMBINDEX", "TROPONINI" in the last 168 hours.  BNP: Invalid input(s): "POCBNP"  CBG: Recent Labs  Lab 06/08/22 1028 06/12/22 1229 06/12/22 1344  GLUCAP 129* 116* 146*     Microbiology: Results for orders placed or performed during the hospital encounter of 06/02/22  Blood culture (routine x 2)     Status: None   Collection Time: 06/02/22  4:24 PM   Specimen: BLOOD  Result Value Ref Range Status   Specimen Description BLOOD LEFT ANTECUBITAL  Final   Special Requests   Final    BOTTLES DRAWN AEROBIC AND ANAEROBIC Blood Culture results  may not be optimal due to an inadequate volume of blood received in culture bottles   Culture   Final    NO GROWTH 5 DAYS Performed at [facility], [address] [place], [place] [address]    Report Status 06/07/2022 FINAL  Final  Blood culture (routine x 2)     Status: None   Collection Time: 06/02/22  8:45 PM   Specimen: BLOOD LEFT HAND  Result Value Ref Range Status   Specimen Description BLOOD LEFT HAND  Final   Special Requests   Final    BOTTLES DRAWN AEROBIC AND ANAEROBIC Blood Culture adequate volume   Culture   Final    NO  GROWTH 5 DAYS Performed at [facility], [address], [place], [place] [address]    Report Status 06/07/2022 FINAL  Final  Culture, blood (Routine X 2) w Reflex to ID Panel     Status: None (Preliminary result)   Collection Time: 06/12/22 11:20 AM   Specimen: BLOOD  Result Value Ref Range Status   Specimen Description BLOOD BLOOD RIGHT HAND  Final   Special Requests   Final    BOTTLES DRAWN AEROBIC AND ANAEROBIC Blood Culture adequate volume   Culture   Final    NO GROWTH 2 DAYS Performed at [facility], [address] [place], [place] [address]    Report Status PENDING  Incomplete  MRSA Next Gen by PCR, Nasal     Status: None   Collection Time: 06/12/22 12:34 PM   Specimen: Nasal Mucosa; Nasal Swab  Result Value Ref Range Status   MRSA by PCR Next Gen NOT DETECTED NOT DETECTED Final    Comment: (NOTE) The GeneXpert MRSA Assay (FDA approved for NASAL specimens only), is one component of a comprehensive MRSA colonization surveillance program. It is not intended to diagnose MRSA infection nor to guide or monitor treatment for MRSA infections. Test performance is not FDA approved in patients less than 15 years old. Performed at [facility], [address] [place], [place] [address]   Culture, blood (Routine X 2) w Reflex to ID Panel     Status: None (Preliminary result)   Collection Time: 06/12/22 12:36 PM   Specimen: BLOOD RIGHT HAND  Result Value Ref Range Status   Specimen Description BLOOD RIGHT HAND  Final   Special Requests   Final    BOTTLES DRAWN AEROBIC AND ANAEROBIC Blood Culture adequate volume   Culture   Final    NO GROWTH 2 DAYS Performed at [facility], [address], [place],  [address]    Report Status PENDING  Incomplete    Coagulation Studies: No results for input(s): "LABPROT", "INR" in the last 72 hours.  Urinalysis: No results for input(s): "COLORURINE", "LABSPEC", "PHURINE", "GLUCOSEU",  "HGBUR", "BILIRUBINUR", "KETONESUR", "PROTEINUR", "UROBILINOGEN", "NITRITE", "LEUKOCYTESUR" in the last 72 hours.  Invalid input(s): "APPERANCEUR"    Imaging: CT HEAD WO CONTRAST (5MM)  Result Date: 06/12/2022 CLINICAL DATA:  Altered mental status EXAM: CT HEAD WITHOUT CONTRAST TECHNIQUE: Contiguous axial images were obtained from the base of the skull through the vertex without intravenous contrast. RADIATION DOSE REDUCTION: This exam was performed according to the departmental dose-optimization program which includes automated exposure control, adjustment of the mA and/or kV according to patient size and/or use of iterative reconstruction technique. COMPARISON:  04/20/2022 FINDINGS: Brain: No evidence of acute infarction, hemorrhage, hydrocephalus, extra-axial collection or mass lesion/mass effect. Periventricular and deep white matter hypodensity. Calcification of the  bilateral globi pallidi as well as striated vascular calcification of the bilateral corona radiata unchanged (series 3, image 13, 21). Vascular: No hyperdense vessel or unexpected calcification. Skull: Normal. Negative for fracture or focal lesion. Sinuses/Orbits: No acute finding. Other: Extensive vascular calcinosis about the included head and neck. IMPRESSION: No acute intracranial pathology. Small-vessel white matter disease. Electronically Signed   By: Delanna Ahmadi M.D.   On: 06/12/2022 16:06   DG Chest Port 1 View  Result Date: 06/12/2022 CLINICAL DATA:  Sepsis, CHF, COPD EXAM: PORTABLE CHEST 1 VIEW COMPARISON:  Portable exam 0915 hours compared to 09/25/2021; correlation CT angio head/neck 10/29/2016 FINDINGS: Dual lumen central venous catheter via infra diaphragmatic approach with tip projecting over the SVC/RIGHT atrial junction. Enlargement of cardiac silhouette post CABG and MVR. Persistent LEFT pleural effusion and compressive atelectasis of LEFT lung. Underlying pulmonary infiltrates bilaterally. Calcified mass LEFT  paratracheal, corresponding to calcified LEFT thyroid mass on prior CT from 2018. No pneumothorax or acute osseous findings. Atherosclerotic calcifications aorta with stent identified at RIGHT axillary vessels. IMPRESSION: Persistent LEFT pleural effusion and compressive atelectasis of LEFT lung. Mild diffuse pulmonary infiltrates question pulmonary edema. Mild enlargement of cardiac silhouette post CABG and MVR. Calcified LEFT thyroid mass unchanged since 10/28/2016; Stability for greater than 5 years implies benignity; no biopsy or followup indicated (ref: J Am Coll Radiol. 2015 Feb;12(2): 143-50). Electronically Signed   By: Lavonia Dana M.D.   On: 06/12/2022 09:29     Medications:    levETIRAcetam Stopped (06/12/22 2240)   levETIRAcetam Stopped (06/13/22 2257)   norepinephrine (LEVOPHED) Adult infusion 2 mcg/min (06/12/22 1529)   piperacillin-tazobactam (ZOSYN)  IV 2.25 g (06/14/22 0526)   vasopressin Stopped (06/12/22 2215)    (feeding supplement) PROSource Plus  30 mL Oral TID BM   apixaban  2.5 mg Oral BID   vitamin C  500 mg Oral BID   atorvastatin  40 mg Oral Daily   Chlorhexidine Gluconate Cloth  6 each Topical Daily   Chlorhexidine Gluconate Cloth  6 each Topical Once   DULoxetine  40 mg Oral BID   epoetin (EPOGEN/PROCRIT) injection  4,000 Units Intravenous Q M,W,F-HD   feeding supplement (NEPRO CARB STEADY)  237 mL Oral TID BM   midodrine  10 mg Oral TID WC   multivitamin  1 tablet Oral QHS   mouth rinse  15 mL Mouth Rinse 4 times per day   pantoprazole  40 mg Oral Daily   polyethylene glycol  17 g Oral Daily   senna-docusate  1 tablet Oral QHS   sevelamer carbonate  800 mg Oral TID WC   zinc sulfate  220 mg Oral Daily   acetaminophen, haloperidol lactate, HYDROmorphone (DILAUDID) injection, ondansetron **OR** ondansetron (ZOFRAN) IV, ondansetron (ZOFRAN) IV, mouth rinse, oxyCODONE-acetaminophen  Assessment/ Plan:  Ernest [name] is a 76 y.o.  male  with past medical  conditions including CAD, COPD, CHF, diabetes, stroke with late seizures, hypertension, and end-stage renal disease on hemodialysis.  Patient presents to the emergency department from [facility] with concerns of left foot wound and has been admitted for 06/02/2022  3:25 PM   [facility]/MWF/right thigh PermCath  End-stage renal disease on hemodialysis.   Patient underwent hemodialysis treatment yesterday.  Tolerated relatively well.  No immediate need for dialysis today.  Family still considering transition to comfort care.  2. Anemia of chronic kidney disease Lab Results  Component Value Date   HGB 9.2 (L) 06/14/2022    Patient  received blood transfusion yesterday.  Hemoglobin up to 9.2.  Continue to monitor CBC.  3. Secondary Hyperparathyroidism: with outpatient labs: PTH 534, phosphorus 4.5, calcium 8.0 on 05/21/2022.   Lab Results  Component Value Date   PTH 63 10/05/2017   PTH Comment 10/05/2017   CALCIUM 8.7 (L) 06/14/2022   CAION 1.05 (L) 07/12/2020   PHOS 2.9 06/12/2022   Continue sevelamer with meals.   4.  Hypertension with chronic kidney disease.  Home regimen includes amlodipine. Blood pressure soft this morning, 85/58.  Amlodipine stopped, patient placed on midodrine 5 mg 3 times daily. Blood pressure 122/52   5.  Dry gangrene,  on left foot.  Patient was admitted with dry gangrene.  Patient is now s/p AKA Vascular surgery and primary team are following     LOS: 12 Tanza Pellot 8/31/20238:31 AM

## 2022-06-14 NOTE — Consult Note (Signed)
ANTICOAGULATION CONSULT NOTE - Follow Up Consult  Pharmacy Consult for Eliquis > Heparin gtt Indication:  Atrial Flutter (PTA Elquis)  [name]  Patient Measurements: Height: 5\' 9"  (175.3 cm) Weight: 55.9 kg (123 lb 3.8 oz) IBW/kg (Calculated) : 70.7 Heparin Dosing Weight: 57.9kg  Vital Signs: Temp: 97.9 F (36.6 C) (08/31 1400) Temp Source: Oral (08/31 1400) Pulse Rate: 110 (08/31 1700)  Labs: Recent Labs    06/12/22 1119 06/12/22 1644 06/12/22 1650 06/12/22 1950 06/13/22 1954 06/13/22 2137 06/14/22 0525 06/14/22 1115 06/14/22 1128 06/14/22 2017  HGB 8.0*  --  7.1*  --   --   --  9.2*  --   --   --   HCT 25.4*  --  22.3*  --   --   --  28.2*  --   --   --   PLT 385  --  292  --   --   --  295  --   --   --   APTT  --   --   --   --   --   --   --   --  51* 70*  HEPARINUNFRC  --   --   --   --   --   --   --  >1.10*  --  >1.10*  CREATININE 3.19*  --   --   --   --   --  2.20*  --   --   --   TROPONINIHS 577*   < >  --  1,505* 2,114* 2,025*  --   --   --   --    < > = values in this interval not displayed.     Estimated Creatinine Clearance: 23.6 mL/min (A) (by C-G formula based on SCr of 2.2 mg/dL (H)).   Medications: NKDA & Heparin Dosing Weight: 57.9kg PTA: eliquis 2.5mg  BID Inpatient: eliquis 2.5mg  BID (last dose 8/30 PM) >> Heparin gtt (8/31 AM>>  Assessment: 76yo M w/ h/o CAD (CABG), HTN, Dementia, Aflutter (on elquis), MV repair Nov'19, PAD, ESRD-HD, stroke c/cb seizures, presenting 06/02/2022 for dry gangrene of the foot (s/p amputation), agreeing to dialysis August 23, left AKA August 25, August 29 transferred to ICU for septic shock & hypotension 2/2 PNA requiring pressors initially now weaned off pressors. Repeat ECHO with severely depressed EF of 20-25%; GHK.  Date Time aPTT/HL Rate/Comment       Baseline Labs: aPTT - pending; INR - pending Hgb - 11.1>9.6>7.1>trf>9.2; Plts - 269>306 Trop - 4656>8127  Goal of Therapy:  Heparin level  0.3-0.7 units/ml aPTT 66-102 seconds Monitor platelets by anticoagulation protocol: Yes   Plan:  Last dose of eliquis 2.5mg  given 8/30 2234 ISO dec'd renal fxn (CrCl 23.19ml/min). Anticipate lab interference to persist, dose heparin by aPTT until correlation.  NOTE: NO BOLUSES per Cards ISO anemia req'ing transfusion. 8/31@2017 : aPTT 70 sec(thera x 1), HL>1.10 Continue heparin infusion at 800 units/hr Check aPTT/Anti-Xa level with AM labs. Titrate by aPTT's until lab correlation is noted, then titrate by anti-xa alone. Continue to monitor H&H and platelets daily while on heparin gtt.  Ernest [name] 06/14/2022,8:48 PM

## 2022-06-14 NOTE — Assessment & Plan Note (Signed)
Likely due to demand ischemia from severe hypotension, tachycardia transient V. tach.  Troponin peaked 2100 and now trending down Echo showed EF dropped from 50-> 20 to 25% with global hypokinesis -Cardiology following and appreciate their input -Not a good candidate for ischemic evaluation at this current time -Heparin drip for now and hold Eliquis due to anemia

## 2022-06-14 NOTE — Assessment & Plan Note (Signed)
On IV Keppra.

## 2022-06-15 DIAGNOSIS — R778 Other specified abnormalities of plasma proteins: Secondary | ICD-10-CM | POA: Diagnosis not present

## 2022-06-15 DIAGNOSIS — F4329 Adjustment disorder with other symptoms: Secondary | ICD-10-CM | POA: Diagnosis not present

## 2022-06-15 LAB — BASIC METABOLIC PANEL
Anion gap: 7 (ref 5–15)
BUN: 27 mg/dL — ABNORMAL HIGH (ref 8–23)
CO2: 26 mmol/L (ref 22–32)
Calcium: 8.8 mg/dL — ABNORMAL LOW (ref 8.9–10.3)
Chloride: 103 mmol/L (ref 98–111)
Creatinine, Ser: 3 mg/dL — ABNORMAL HIGH (ref 0.61–1.24)
GFR, Estimated: 21 mL/min — ABNORMAL LOW (ref >60)
Glucose, Bld: 104 mg/dL — ABNORMAL HIGH (ref 70–99)
Potassium: 3.9 mmol/L (ref 3.5–5.1)
Sodium: 136 mmol/L (ref 135–145)

## 2022-06-15 LAB — CBC
HCT: 29.3 % — ABNORMAL LOW (ref 39.0–52.0)
Hemoglobin: 9.4 g/dL — ABNORMAL LOW (ref 13.0–17.0)
MCH: 26.3 pg (ref 26.0–34.0)
MCHC: 32.1 g/dL (ref 30.0–36.0)
MCV: 82.1 fL (ref 80.0–100.0)
Platelets: 335 10*3/uL (ref 150–400)
RBC: 3.57 MIL/uL — ABNORMAL LOW (ref 4.22–5.81)
RDW: 20 % — ABNORMAL HIGH (ref 11.5–15.5)
WBC: 12.3 10*3/uL — ABNORMAL HIGH (ref 4.0–10.5)
nRBC: 0 % (ref 0.0–0.2)

## 2022-06-15 LAB — APTT: aPTT: 75 seconds — ABNORMAL HIGH (ref 24–36)

## 2022-06-15 LAB — HEPARIN LEVEL (UNFRACTIONATED): Heparin Unfractionated: >1.1 IU/mL — ABNORMAL HIGH (ref 0.30–0.70)

## 2022-06-15 MED ORDER — PIPERACILLIN-TAZOBACTAM IN DEX 2-0.25 GM/50ML IV SOLN
2.2500 g | Freq: Three times a day (TID) | INTRAVENOUS | Status: DC
  Administered 2022-06-15 – 2022-06-19 (×12): 2.25 g via INTRAVENOUS
  Filled 2022-06-15 (×13): qty 50

## 2022-06-15 MED ORDER — HEPARIN SODIUM (PORCINE) 1000 UNIT/ML IJ SOLN
INTRAMUSCULAR | Status: AC
  Administered 2022-06-15: 5200 [IU]
  Filled 2022-06-15: qty 10

## 2022-06-15 MED ORDER — MIDODRINE HCL 5 MG PO TABS
5.0000 mg | ORAL_TABLET | Freq: Three times a day (TID) | ORAL | Status: DC
  Administered 2022-06-16 – 2022-06-17 (×3): 5 mg via ORAL
  Filled 2022-06-15 (×3): qty 1

## 2022-06-15 MED ORDER — APIXABAN 2.5 MG PO TABS
2.5000 mg | ORAL_TABLET | Freq: Two times a day (BID) | ORAL | Status: DC
Start: 2022-06-15 — End: 2022-06-19
  Administered 2022-06-16 – 2022-06-18 (×5): 2.5 mg via ORAL
  Filled 2022-06-15 (×5): qty 1

## 2022-06-15 NOTE — Progress Notes (Signed)
Progress Note   Patient: Ernest [name] YPP:[id number] DOB: Oct 21, 1945 DOA: 06/02/2022     13 DOS: the patient was seen and examined on 06/15/2022   Brief hospital course: 76 year old man with chronic systolic congestive heart failure, COPD, end-stage renal disease, hepatitis C, hypertension, stroke.  Patient brought in by family secondary to gangrene left foot.  Started empirically on antibiotics.  On 06/04/2022 patient refused dialysis and refused medications.  Patient declined amputation at this time.  Appreciate palliative care consultation and patient was made a DO NOT RESUSCITATE. Patient is seen by palliative care for poor prognosis.   8/23.  Patient was agreeable and dialyzed today. 8/25.  Left AKA 8/29: Patient was hypotensive requiring transfer to ICU for pressors 8/30: Weaned off pressor, DNR, troponin trending up 8/31: Daughter is not ready for comfort care/hospice yet and wants aggressive management 9/1: again discussed with daughter very poor prognosis, palliative consulted   Assessment and Plan: Dry gangrene ([place]) Gangrene left foot.  Status post left AKA on 8/25.  End-stage renal disease on hemodialysis ([place]) HD per nephro  Vascular dementia ([place]) More stable for now he does get intermittent agitation.  Pressure injury of skin Wound care consultation.  Stage II gluteal cleft.  Secondary hyperparathyroidism of renal origin [facility]) Dialysis patient.  Protein-calorie malnutrition, severe Complicates overall prognosis.  End-of-life care Patient appears to be in the process of dying. Will discuss further with family today. Palliative has been consulted.  Hypokalemia Replaced now normalized w/ dialysis  Impaired fasting glucose Hemoglobin A1c low at 4.7.  have discontinued fingersticks  Septic shock (HCC) Persistent hypotension requiring transfer to ICU/stepdown.  He is now off IV pressors now this is likely due to aspiration pneumonia.[name]   blood pressures are  stable.  Continue IV Zosyn, which was started 8/29. Blood cultures negative. BPs are obtained from a-line, currently MAPs in the upper 60s  Anemia of chronic disease Hemoglobin 11.1-> 7.1->9.2>9.4 Unsure if this acute drop is due to recent procedure/surgery related blood loss Status post 1 PRBC transfusion, hgb stable since, will transition back to home eliquis  Atrial flutter (HCC) Switch back to eliquis as above  Elevated troponin Likely due to demand ischemia from severe hypotension, tachycardia transient V. tach.  Troponin peaked 2100 and now trending down Echo showed EF dropped from 50-> 20 to 25% with global hypokinesis -Cardiology following and appreciate their input -Not a good candidate for ischemic evaluation at this current time -d/c heparin today  Seizure, late effect of stroke ([place]) On IV Keppra.  Chronic HFrEF (heart failure with reduced ejection fraction) (HCC) Well compensated.  Cardiology following, echo shows EF of 20 to 25% which is a drop from before when it was 50%      Subjective: asleep, doesn't rouse for my exam  Physical Exam: Vitals:   06/15/22 0700 06/15/22 0800 06/15/22 0845 06/15/22 0900  BP:      Pulse: (!) 109 (!) 116 (!) 113 (!) 112  Resp: 13 18 (!) 21 11  Temp:  98.6 F (37 C)    TempSrc:  Axillary    SpO2: 100% 100% 100% 99%  Weight:      Height:       General exam: Ill-appearing, cachectic. Respiratory system: Decreased breathing sounds without crackles at bases. Respiratory effort normal. Cardiovascular system: Regular and tachycardic..= Gastrointestinal system: Abdomen is benign Central nervous system: Lethargic,Nonfocal Extremities: Bilateral BKA, no s/s of infection on stump Psychiatry: calm Access - right thigh permcath Data Reviewed:  Family Communication: Daughter Ernest [name] updated telephonically today  Disposition: Status is: Inpatient Remains inpatient appropriate because: Needs close monitoring in the stepdown due  to mental status, hypotension and now elevated troponins   Planned Discharge Destination: tbd   DVT prophylaxis-apixaban Time spent: 35 minutes  Author: Desma Maxim, MD 06/15/2022 9:38 AM  For on call review [email or web address].

## 2022-06-15 NOTE — Consult Note (Signed)
ANTICOAGULATION CONSULT NOTE - Follow Up Consult  Pharmacy Consult for Eliquis > Heparin gtt Indication:  Atrial Flutter (PTA Elquis)  [name]  Patient Measurements: Height: 5\' 9"  (175.3 cm) Weight: 55.9 kg (123 lb 3.8 oz) IBW/kg (Calculated) : 70.7 Heparin Dosing Weight: 57.9kg  Vital Signs: Temp: 98.6 F (37 C) (09/01 0000) Temp Source: Axillary (09/01 0000) Pulse Rate: 110 (09/01 0000)  Labs: Recent Labs    06/12/22 1119 06/12/22 1644 06/12/22 1650 06/12/22 1950 06/13/22 1954 06/13/22 2137 06/14/22 0525 06/14/22 1115 06/14/22 1128 06/14/22 2017 06/15/22 0502  HGB 8.0*  --  7.1*  --   --   --  9.2*  --   --   --  9.4*  HCT 25.4*  --  22.3*  --   --   --  28.2*  --   --   --  29.3*  PLT 385  --  292  --   --   --  295  --   --   --  335  APTT  --   --   --   --   --   --   --   --  51* 70* 75*  HEPARINUNFRC  --   --   --   --   --   --   --  >1.10*  --  >1.10* >1.10*  CREATININE 3.19*  --   --   --   --   --  2.20*  --   --   --  3.00*  TROPONINIHS 577*   < >  --  1,505* 2,114* 2,025*  --   --   --   --   --    < > = values in this interval not displayed.     Estimated Creatinine Clearance: 17.3 mL/min (A) (by C-G formula based on SCr of 3 mg/dL (H)).   Medications: NKDA & Heparin Dosing Weight: 57.9kg PTA: eliquis 2.5mg  BID Inpatient: eliquis 2.5mg  BID (last dose 8/30 PM) >> Heparin gtt (8/31 AM>>  Assessment: 76yo M w/ h/o CAD (CABG), HTN, Dementia, Aflutter (on elquis), MV repair Nov'19, PAD, ESRD-HD, stroke c/cb seizures, presenting 06/02/2022 for dry gangrene of the foot (s/p amputation), agreeing to dialysis August 23, left AKA August 25, August 29 transferred to ICU for septic shock & hypotension 2/2 PNA requiring pressors initially now weaned off pressors. Repeat ECHO with severely depressed EF of 20-25%; GHK.  Date Time aPTT/HL Rate/Comment       Baseline Labs: aPTT - pending; INR - pending Hgb - 11.1>9.6>7.1>trf>9.2; Plts - 269>306 Trop  - 4403>4742  Goal of Therapy:  Heparin level 0.3-0.7 units/ml aPTT 66-102 seconds Monitor platelets by anticoagulation protocol: Yes   Plan:  Last dose of eliquis 2.5mg  given 8/30 2234 ISO dec'd renal fxn (CrCl 23.28ml/min). Anticipate lab interference to persist, dose heparin by aPTT until correlation.  NOTE: NO BOLUSES per Cards ISO anemia req'ing transfusion.  9/1 @ 0502: aPTT = 75 (therapeutic X 2) , HL = > 1.10 (elevated) - will continue to use aPTT to guide dosing until both are therapeutic - Will continue pt on current rate and recheck aPTT and HL on 9/2 with AM Labs.   Ernest [name] D 06/15/2022,5:35 AM

## 2022-06-15 NOTE — Progress Notes (Signed)
[facility]  ROUNDING NOTE   Subjective:   Ernest [name] is a 76 year old African-American male with past medical conditions including CAD, COPD, CHF, diabetes, stroke with late seizures, hypertension, and end-stage renal disease on hemodialysis.  Patient presents to the emergency department from [facility] with concerns of left foot wound and has been admitted for Dry gangrene [facility]) [I96]  Patient is known to our practice from previous admissions and receives outpatient dialysis treatments at [facility] on a MWF schedule, supervised by [facility] physicians.    Update: Patient seen at bedside.  Remains in ICU.   Difficult to register pressures on the floor.   Objective:  Vital signs in last 24 hours:  Temp:  [97.9 F (36.6 C)-98.7 F (37.1 C)] 98.6 F (37 C) (09/01 0800) Pulse Rate:  [81-116] 109 (09/01 1100) Resp:  [10-28] 10 (09/01 1100) SpO2:  [93 %-100 %] 98 % (09/01 1100) Arterial Line BP: (105-134)/(38-60) 113/52 (09/01 1100)  Weight change:  Filed Weights   06/02/22 2026 06/06/22 0809 06/06/22 1230  Weight: 57.9 kg 56.9 kg 55.9 kg    Intake/Output: I/O last 3 completed shifts: In: 1765.2 [P.O.:240; I.V.:173.5; Blood:377.5; IV Piggyback:974.2] Out: -    Intake/Output this shift:  Total I/O In: 197.3 [I.V.:47.4; IV Piggyback:149.9] Out: -   Physical Exam: General: NAD  Head: Normocephalic, atraumatic. Moist oral mucosal membranes  Eyes: Anicteric  Lungs:  Clear to auscultation, normal effort  Heart: Irregular rhythm  Abdomen:  Soft, nontender, nondistended  Extremities: No peripheral edema.  Bilateral AKA  Neurologic: Arousable  Skin: Warm, dry  Access: Right thigh PermCath    Basic Metabolic Panel: Recent Labs  Lab 06/09/22 0500 06/11/22 0924 06/12/22 1119 06/14/22 0525 06/15/22 0502  NA 136 132* 136 136 136  K 3.9 4.6 4.4 3.1* 3.9  CL 100 95* 101 100 103  CO2 28 27 27 26 26   GLUCOSE 128* 187* 120* 109* 104*  BUN 24*  49* 33* 21 27*  CREATININE 2.85* 4.33* 3.19* 2.20* 3.00*  CALCIUM 8.5* 8.7* 9.1 8.7* 8.8*  MG 2.0  --  1.9  --   --   PHOS  --  3.5 2.9  --   --      Liver Function Tests: Recent Labs  Lab 06/11/22 0924  ALBUMIN 2.1*    No results for input(s): "LIPASE", "AMYLASE" in the last 168 hours. No results for input(s): "AMMONIA" in the last 168 hours.  CBC: Recent Labs  Lab 06/11/22 0416 06/12/22 1119 06/12/22 1650 06/14/22 0525 06/15/22 0502  WBC 15.5* 15.8* 14.6* 12.7* 12.3*  NEUTROABS  --   --  13.2*  --   --   HGB 8.9* 8.0* 7.1* 9.2* 9.4*  HCT 27.8* 25.4* 22.3* 28.2* 29.3*  MCV 78.3* 79.9* 80.8 80.8 82.1  PLT 281 385 292 295 335     Cardiac Enzymes: No results for input(s): "CKTOTAL", "CKMB", "CKMBINDEX", "TROPONINI" in the last 168 hours.  BNP: Invalid input(s): "POCBNP"  CBG: Recent Labs  Lab 06/12/22 1229 06/12/22 1344  GLUCAP 116* 146*     Microbiology: Results for orders placed or performed during the hospital encounter of 06/02/22  Blood culture (routine x 2)     Status: None   Collection Time: 06/02/22  4:24 PM   Specimen: BLOOD  Result Value Ref Range Status   Specimen Description BLOOD LEFT ANTECUBITAL  Final   Special Requests   Final    BOTTLES DRAWN AEROBIC AND ANAEROBIC Blood Culture results may  not be optimal due to an inadequate volume of blood received in culture bottles   Culture   Final    NO GROWTH 5 DAYS Performed at [facility], [address] [place], [place] [address]    Report Status 06/07/2022 FINAL  Final  Blood culture (routine x 2)     Status: None   Collection Time: 06/02/22  8:45 PM   Specimen: BLOOD LEFT HAND  Result Value Ref Range Status   Specimen Description BLOOD LEFT HAND  Final   Special Requests   Final    BOTTLES DRAWN AEROBIC AND ANAEROBIC Blood Culture adequate volume   Culture   Final    NO GROWTH 5 DAYS Performed at [facility], [address] [place], [place] [address]    Report  Status 06/07/2022 FINAL  Final  Culture, blood (Routine X 2) w Reflex to ID Panel     Status: None (Preliminary result)   Collection Time: 06/12/22 11:20 AM   Specimen: BLOOD  Result Value Ref Range Status   Specimen Description BLOOD BLOOD RIGHT HAND  Final   Special Requests   Final    BOTTLES DRAWN AEROBIC AND ANAEROBIC Blood Culture adequate volume   Culture   Final    NO GROWTH 3 DAYS Performed at [facility], [address] [place], [place] [address]    Report Status PENDING  Incomplete  MRSA Next Gen by PCR, Nasal     Status: None   Collection Time: 06/12/22 12:34 PM   Specimen: Nasal Mucosa; Nasal Swab  Result Value Ref Range Status   MRSA by PCR Next Gen NOT DETECTED NOT DETECTED Final    Comment: (NOTE) The GeneXpert MRSA Assay (FDA approved for NASAL specimens only), is one component of a comprehensive MRSA colonization surveillance program. It is not intended to diagnose MRSA infection nor to guide or monitor treatment for MRSA infections. Test performance is not FDA approved in patients less than 31 years old. Performed at [facility], [address] [place], [place] [address]   Culture, blood (Routine X 2) w Reflex to ID Panel     Status: None (Preliminary result)   Collection Time: 06/12/22 12:36 PM   Specimen: BLOOD RIGHT HAND  Result Value Ref Range Status   Specimen Description BLOOD RIGHT HAND  Final   Special Requests   Final    BOTTLES DRAWN AEROBIC AND ANAEROBIC Blood Culture adequate volume   Culture   Final    NO GROWTH 3 DAYS Performed at [facility], [address] [place], [place] [address]    Report Status PENDING  Incomplete    Coagulation Studies: No results for input(s): "LABPROT", "INR" in the last 72 hours.  Urinalysis: No results for input(s): "COLORURINE", "LABSPEC", "PHURINE", "GLUCOSEU", "HGBUR", "BILIRUBINUR", "KETONESUR", "PROTEINUR", "UROBILINOGEN", "NITRITE", "LEUKOCYTESUR" in the last 72  hours.  Invalid input(s): "APPERANCEUR"    Imaging: ECHOCARDIOGRAM COMPLETE  Result Date: 06/14/2022    ECHOCARDIOGRAM REPORT   Patient Name:   Ernest [name] Date of Exam: 06/14/2022 Medical Rec #:  [id number]       Height:       69.0 in Accession #:    [id number]      Weight:       123.2 lb Date of Birth:  [birth year]       BSA:          1.682 m Patient Age:    58 years  BP:           122/53 mmHg Patient Gender: M               HR:           100 bpm. Exam Location:  [facility] Procedure: 2D Echo, Cardiac Doppler and Color Doppler Indications:     Elevated troponin  History:         Patient has prior history of Echocardiogram examinations, most                  recent 06/27/2021. COPD, Signs/Symptoms:Shortness of Breath;                  Risk Factors:Hypertension and Dyslipidemia. Bacteremia.  Sonographer:     Sherrie Sport Referring Phys:  [place] Diagnosing Phys: Ida Rogue MD IMPRESSIONS  1. Left ventricular ejection fraction, by estimation, is 20 to 25%. The left ventricle has severely decreased function. The left ventricle demonstrates global hypokinesis. Indeterminate diastolic filling due to E-A fusion.  2. Right ventricular systolic function is moderately reduced. The right ventricular size is moderately enlarged. There is mildly elevated pulmonary artery systolic pressure. The estimated right ventricular systolic pressure is 25.4 mmHg.  3. Moderate pleural effusion in the left lateral region.  4. The mitral valve is normal in structure. Mild mitral valve regurgitation. No evidence of mitral stenosis.  5. The aortic valve is normal in structure. Aortic valve regurgitation is not visualized. Aortic valve sclerosis is present, with no evidence of aortic valve stenosis.  6. The inferior vena cava is normal in size with greater than 50% respiratory variability, suggesting right atrial pressure of 3 mmHg. FINDINGS  Left Ventricle: Left ventricular ejection fraction, by estimation, is 20 to 25%.  The left ventricle has severely decreased function. The left ventricle demonstrates global hypokinesis. The left ventricular internal cavity size was normal in size. There is no left ventricular hypertrophy. Indeterminate diastolic filling due to E-A fusion. Right Ventricle: The right ventricular size is moderately enlarged. No increase in right ventricular wall thickness. Right ventricular systolic function is moderately reduced. There is mildly elevated pulmonary artery systolic pressure. The tricuspid regurgitant velocity is 2.99 m/s, and with an assumed right atrial pressure of 5 mmHg, the estimated right ventricular systolic pressure is 27.0 mmHg. Left Atrium: Left atrial size was normal in size. Right Atrium: Right atrial size was normal in size. Pericardium: There is no evidence of pericardial effusion. Mitral Valve: The mitral valve is normal in structure. Mild mitral valve regurgitation. No evidence of mitral valve stenosis. MV peak gradient, 8.4 mmHg. The mean mitral valve gradient is 5.0 mmHg. Tricuspid Valve: The tricuspid valve is normal in structure. Tricuspid valve regurgitation is mild . No evidence of tricuspid stenosis. Aortic Valve: The aortic valve is normal in structure. Aortic valve regurgitation is not visualized. Aortic valve sclerosis is present, with no evidence of aortic valve stenosis. Aortic valve mean gradient measures 1.0 mmHg. Aortic valve peak gradient measures 2.6 mmHg. Aortic valve area, by VTI measures 1.88 cm. Pulmonic Valve: The pulmonic valve was normal in structure. Pulmonic valve regurgitation is trivial. No evidence of pulmonic stenosis. Aorta: The aortic root is normal in size and structure. Venous: The inferior vena cava is normal in size with greater than 50% respiratory variability, suggesting right atrial pressure of 3 mmHg. IAS/Shunts: No atrial level shunt detected by color flow Doppler. Additional Comments: There is a moderate pleural effusion in the left lateral  region.  LEFT  VENTRICLE PLAX 2D LVIDd:         5.18 cm      Diastology LVIDs:         4.94 cm      LV e' medial:    7.72 cm/s LV PW:         0.96 cm      LV E/e' medial:  10.7 LV IVS:        1.14 cm      LV e' lateral:   6.96 cm/s LVOT diam:     2.00 cm      LV E/e' lateral: 11.9 LV SV:         21 LV SV Index:   12 LVOT Area:     3.14 cm  LV Volumes (MOD) LV vol d, MOD A2C: 171.0 ml LV vol d, MOD A4C: 178.0 ml LV vol s, MOD A2C: 130.0 ml LV vol s, MOD A4C: 139.0 ml LV SV MOD A2C:     41.0 ml LV SV MOD A4C:     178.0 ml LV SV MOD BP:      42.0 ml RIGHT VENTRICLE RV Basal diam:  4.87 cm RV S prime:     6.09 cm/s TAPSE (M-mode): 1.3 cm LEFT ATRIUM              Index        RIGHT ATRIUM           Index LA Vol (A2C):   108.0 ml 64.22 ml/m  RA Area:     13.00 cm LA Vol (A4C):   78.4 ml  46.62 ml/m  RA Volume:   34.00 ml  20.22 ml/m LA Biplane Vol: 94.7 ml  56.31 ml/m  AORTIC VALVE                    PULMONIC VALVE AV Area (Vmax):    1.61 cm     PR End Diast Vel: 11.56 msec AV Area (Vmean):   1.73 cm AV Area (VTI):     1.88 cm AV Vmax:           80.35 cm/s AV Vmean:          54.400 cm/s AV VTI:            0.109 m AV Peak Grad:      2.6 mmHg AV Mean Grad:      1.0 mmHg LVOT Vmax:         41.10 cm/s LVOT Vmean:        29.900 cm/s LVOT VTI:          0.065 m LVOT/AV VTI ratio: 0.60  AORTA Ao Root diam: 3.70 cm MITRAL VALVE                TRICUSPID VALVE MV Area (PHT): 7.90 cm     TR Peak grad:   35.8 mmHg MV Area VTI:   1.04 cm     TR Vmax:        299.00 cm/s MV Peak grad:  8.4 mmHg MV Mean grad:  5.0 mmHg     SHUNTS MV Vmax:       1.45 m/s     Systemic VTI:  0.07 m MV Vmean:      102.0 cm/s   Systemic Diam: 2.00 cm MV Decel Time: 96 msec MV E velocity: 82.70 cm/s MV A velocity: 121.00 cm/s MV E/A ratio:  0.68 Ida Rogue MD Electronically signed by Ida Rogue  MD Signature Date/Time: 06/14/2022/1:41:02 PM    Final      Medications:    levETIRAcetam Stopped (06/14/22 2253)   levETIRAcetam Stopped  (06/15/22 1014)   piperacillin-tazobactam (ZOSYN)  IV      (feeding supplement) PROSource Plus  30 mL Oral TID BM   apixaban  2.5 mg Oral BID   vitamin C  500 mg Oral BID   atorvastatin  40 mg Oral Daily   Chlorhexidine Gluconate Cloth  6 each Topical Daily   Chlorhexidine Gluconate Cloth  6 each Topical Once   DULoxetine  40 mg Oral BID   epoetin (EPOGEN/PROCRIT) injection  4,000 Units Intravenous Q M,W,F-HD   feeding supplement (NEPRO CARB STEADY)  237 mL Oral TID BM   midodrine  5 mg Oral TID WC   multivitamin  1 tablet Oral QHS   mouth rinse  15 mL Mouth Rinse 4 times per day   pantoprazole (PROTONIX) IV  40 mg Intravenous Q24H   polyethylene glycol  17 g Oral Daily   senna-docusate  1 tablet Oral QHS   sevelamer carbonate  800 mg Oral TID WC   zinc sulfate  220 mg Oral Daily   haloperidol lactate, HYDROmorphone (DILAUDID) injection, ondansetron **OR** ondansetron (ZOFRAN) IV, ondansetron (ZOFRAN) IV, mouth rinse, oxyCODONE-acetaminophen  Assessment/ Plan:  [name] is a 76 y.o.  male  with past medical conditions including CAD, COPD, CHF, diabetes, stroke with late seizures, hypertension, and end-stage renal disease on hemodialysis.  Patient presents [facility] the emergency department [facility] with concerns of left foot wound and has been admitted for 06/02/2022  3:25 PM   [facility]/MWF/right thigh PermCath  End-stage renal disease on hemodialysis.   Patient due for hemodialysis treatment later this afternoon.   2. Anemia of chronic kidney disease Lab Results  Component Value Date   HGB 9.4 (L) 06/15/2022    Hgb close to goal at 9.4, continue epogen.   3. Secondary Hyperparathyroidism: Lab Results  Component Value Date   PTH 63 10/05/2017   PTH Comment 10/05/2017   CALCIUM 8.8 (L) 06/15/2022   CAION 1.05 (L) 07/12/2020   PHOS 2.9 06/12/2022   Continue sevelamer with meals.   4.  Hypertension with chronic kidney disease.  Home regimen  includes amlodipine. Now on midodrine only.    5.  Dry gangrene,  on left foot.  Patient was admitted with dry gangrene.  Patient is now s/p AKA Vascular surgery and primary team are following     LOS: 13 Magin Balbi 9/1/202312:01 PM

## 2022-06-15 NOTE — Consult Note (Signed)
ANTICOAGULATION CONSULT NOTE  Pharmacy Consult for Heparin gtt >> Eliquis  Indication:  Atrial Flutter (PTA Elquis)  [name]  Patient Measurements: Height: 5\' 9"  (175.3 cm) Weight: 55.9 kg (123 lb 3.8 oz) IBW/kg (Calculated) : 70.7 Heparin Dosing Weight: 57.9kg  Vital Signs: Temp: 98.6 F (37 C) (09/01 0800) Temp Source: Axillary (09/01 0800) Pulse Rate: 112 (09/01 0900)  Labs: Recent Labs    06/12/22 1119 06/12/22 1644 06/12/22 1650 06/12/22 1950 06/13/22 1954 06/13/22 2137 06/14/22 0525 06/14/22 1115 06/14/22 1128 06/14/22 2017 06/15/22 0502  HGB 8.0*  --  7.1*  --   --   --  9.2*  --   --   --  9.4*  HCT 25.4*  --  22.3*  --   --   --  28.2*  --   --   --  29.3*  PLT 385  --  292  --   --   --  295  --   --   --  335  APTT  --   --   --   --   --   --   --   --  51* 70* 75*  HEPARINUNFRC  --   --   --   --   --   --   --  >1.10*  --  >1.10* >1.10*  CREATININE 3.19*  --   --   --   --   --  2.20*  --   --   --  3.00*  TROPONINIHS 577*   < >  --  1,505* 2,114* 2,025*  --   --   --   --   --    < > = values in this interval not displayed.     Estimated Creatinine Clearance: 17.3 mL/min (A) (by C-G formula based on SCr of 3 mg/dL (H)).   Medications: NKDA & Heparin Dosing Weight: 57.9kg PTA: eliquis 2.5mg  BID Inpatient: eliquis 2.5mg  BID (last dose 8/30 PM) >> Heparin gtt (8/31 AM>>  Assessment: 76yo M w/ h/o CAD (CABG), HTN, Dementia, Aflutter (on elquis), MV repair Nov'19, PAD, ESRD-HD, stroke c/cb seizures, presenting 06/02/2022 for dry gangrene of the foot (s/p amputation), agreeing to dialysis August 23, left AKA August 25, August 29 transferred to ICU for septic shock & hypotension 2/2 PNA requiring pressors initially now weaned off pressors. Repeat ECHO with severely depressed EF of 20-25%; GHK.   Goal of Therapy:  Monitor platelets by anticoagulation protocol: Yes   Plan:  Stop heparin infusion Start apixaban 2.5 mg po BID (meets 2/3 dose  reduction criteria: serum creatinine 1.5 mg/dL or greater) age 16 years or older and/or weight 60 kg or less) CBC and Scr weekly unless more frequent monitoring needed   Dallie Piles 06/15/2022,9:56 AM

## 2022-06-15 NOTE — Procedures (Signed)
HD Note  Patient tolerated HD treatment well. He was somnolent during most of the treatment.  He became restless at one point and responded yes to questions of pain with medication given, see MAR.   Blood pressures were all arterial during the treatment.  Some information was entered later than the data was gathered due to patient care needs. The entered time with the data is accurate.The documented stated time is accurate.

## 2022-06-15 NOTE — Progress Notes (Signed)
0805: Pt anxious this morning, calling out for "help!" When questioned, no other statements are made by the patient. Attempts made to reposition pt, haldol given. Pt refusing oral medications, Wouk, MD notified. Pt also refusing meals and oral fluids.   0845: Pt again calling for help; endorses pain in his leg. Dilaudid 0.5mg  given with relief. Pt resting quietly.   1345: Pt daughter arrived at bedside requesting updates. Attempts made to update the daughter, son, and brother of the patient. Wouk, MD made aware of request for family meeting and discussion regarding patient's condition and prognosis.

## 2022-06-15 NOTE — Progress Notes (Signed)
Rounding Note    Patient Name: Ernest [name] Date of Encounter: 06/15/2022  [place] Cardiologist: Nelva Bush, MD   Subjective   The patient is asleep after he received Dilaudid for lower extremity pain.  Inpatient Medications    Scheduled Meds:  (feeding supplement) PROSource Plus  30 mL Oral TID BM   apixaban  2.5 mg Oral BID   vitamin C  500 mg Oral BID   atorvastatin  40 mg Oral Daily   Chlorhexidine Gluconate Cloth  6 each Topical Daily   Chlorhexidine Gluconate Cloth  6 each Topical Once   DULoxetine  40 mg Oral BID   epoetin (EPOGEN/PROCRIT) injection  4,000 Units Intravenous Q M,W,F-HD   feeding supplement (NEPRO CARB STEADY)  237 mL Oral TID BM   midodrine  5 mg Oral TID WC   multivitamin  1 tablet Oral QHS   mouth rinse  15 mL Mouth Rinse 4 times per day   pantoprazole (PROTONIX) IV  40 mg Intravenous Q24H   polyethylene glycol  17 g Oral Daily   senna-docusate  1 tablet Oral QHS   sevelamer carbonate  800 mg Oral TID WC   zinc sulfate  220 mg Oral Daily   Continuous Infusions:  levETIRAcetam Stopped (06/14/22 2253)   levETIRAcetam Stopped (06/15/22 1014)   piperacillin-tazobactam (ZOSYN)  IV     PRN Meds: haloperidol lactate, HYDROmorphone (DILAUDID) injection, ondansetron **OR** ondansetron (ZOFRAN) IV, ondansetron (ZOFRAN) IV, mouth rinse, oxyCODONE-acetaminophen   Vital Signs    Vitals:   06/15/22 0800 06/15/22 0845 06/15/22 0900 06/15/22 1000  BP:      Pulse: (!) 116 (!) 113 (!) 112 (!) 109  Resp: 18 (!) 21 11 10   Temp: 98.6 F (37 C)     TempSrc: Axillary     SpO2: 100% 100% 99% 93%  Weight:      Height:        Intake/Output Summary (Last 24 hours) at 06/15/2022 1142 Last data filed at 06/15/2022 1020 Gross per 24 hour  Intake 895.02 ml  Output --  Net 895.02 ml      06/06/2022   12:30 PM 06/06/2022    8:09 AM 06/02/2022    8:26 PM  Last 3 Weights  Weight (lbs) 123 lb 3.8 oz 125 lb 7.1 oz 127 lb 10.3 oz  Weight (kg)  55.9 kg 56.9 kg 57.9 kg      Telemetry    This tachycardia- Personally Reviewed  ECG     - Personally Reviewed  Physical Exam   GEN: Lethargic Neck: Jugular venous pressure is not well visualized. Cardiac: RRR, no murmurs, rubs, or gallops.  Respiratory: Clear to auscultation bilaterally. GI: Soft, nontender, non-distended  MS: Status post left AKA. Neuro:  Nonfocal  Psych: Not able to evaluate.  Labs    High Sensitivity Troponin:   Recent Labs  Lab 06/12/22 1119 06/12/22 1644 06/12/22 1950 06/13/22 1954 06/13/22 2137  TROPONINIHS 577* 1,183* 1,505* 2,114* 2,025*     Chemistry Recent Labs  Lab 06/09/22 0500 06/11/22 0924 06/12/22 1119 06/14/22 0525 06/15/22 0502  NA 136 132* 136 136 136  K 3.9 4.6 4.4 3.1* 3.9  CL 100 95* 101 100 103  CO2 28 27 27 26 26   GLUCOSE 128* 187* 120* 109* 104*  BUN 24* 49* 33* 21 27*  CREATININE 2.85* 4.33* 3.19* 2.20* 3.00*  CALCIUM 8.5* 8.7* 9.1 8.7* 8.8*  MG 2.0  --  1.9  --   --  ALBUMIN  --  2.1*  --   --   --   GFRNONAA 23* 14* 20* 31* 21*  ANIONGAP 8 10 8 10 7     Lipids No results for input(s): "CHOL", "TRIG", "HDL", "LABVLDL", "LDLCALC", "CHOLHDL" in the last 168 hours.  Hematology Recent Labs  Lab 06/12/22 1650 06/14/22 0525 06/15/22 0502  WBC 14.6* 12.7* 12.3*  RBC 2.76* 3.49* 3.57*  HGB 7.1* 9.2* 9.4*  HCT 22.3* 28.2* 29.3*  MCV 80.8 80.8 82.1  MCH 25.7* 26.4 26.3  MCHC 31.8 32.6 32.1  RDW 19.9* 19.6* 20.0*  PLT 292 295 335   Thyroid No results for input(s): "TSH", "FREET4" in the last 168 hours.  BNP Recent Labs  Lab 06/12/22 1119  BNP >4,500.0*    DDimer No results for input(s): "DDIMER" in the last 168 hours.   Radiology    ECHOCARDIOGRAM COMPLETE  Result Date: 06/14/2022    ECHOCARDIOGRAM REPORT   Patient Name:   Ernest [name] Date of Exam: 06/14/2022 Medical Rec #:  [id number]       Height:       69.0 in Accession #:    [id number]      Weight:       123.2 lb Date of Birth:  10-22-1945        BSA:          1.682 m Patient Age:    76 years        BP:           122/53 mmHg Patient Gender: M               HR:           100 bpm. Exam Location:  [facility] Procedure: 2D Echo, Cardiac Doppler and Color Doppler Indications:     Elevated troponin  History:         Patient has prior history of Echocardiogram examinations, most                  recent 06/27/2021. COPD, Signs/Symptoms:Shortness of Breath;                  Risk Factors:Hypertension and Dyslipidemia. Bacteremia.  Sonographer:     Sherrie Sport Referring Phys:  [place] Diagnosing Phys: Ida Rogue MD IMPRESSIONS  1. Left ventricular ejection fraction, by estimation, is 20 to 25%. The left ventricle has severely decreased function. The left ventricle demonstrates global hypokinesis. Indeterminate diastolic filling due to E-A fusion.  2. Right ventricular systolic function is moderately reduced. The right ventricular size is moderately enlarged. There is mildly elevated pulmonary artery systolic pressure. The estimated right ventricular systolic pressure is 54.6 mmHg.  3. Moderate pleural effusion in the left lateral region.  4. The mitral valve is normal in structure. Mild mitral valve regurgitation. No evidence of mitral stenosis.  5. The aortic valve is normal in structure. Aortic valve regurgitation is not visualized. Aortic valve sclerosis is present, with no evidence of aortic valve stenosis.  6. The inferior vena cava is normal in size with greater than 50% respiratory variability, suggesting right atrial pressure of 3 mmHg. FINDINGS  Left Ventricle: Left ventricular ejection fraction, by estimation, is 20 to 25%. The left ventricle has severely decreased function. The left ventricle demonstrates global hypokinesis. The left ventricular internal cavity size was normal in size. There is no left ventricular hypertrophy. Indeterminate diastolic filling due to E-A fusion. Right Ventricle: The right ventricular size is moderately enlarged. No  increase in right ventricular wall thickness. Right ventricular systolic function is moderately reduced. There is mildly elevated pulmonary artery systolic pressure. The tricuspid regurgitant velocity is 2.99 m/s, and with an assumed right atrial pressure of 5 mmHg, the estimated right ventricular systolic pressure is 44.9 mmHg. Left Atrium: Left atrial size was normal in size. Right Atrium: Right atrial size was normal in size. Pericardium: There is no evidence of pericardial effusion. Mitral Valve: The mitral valve is normal in structure. Mild mitral valve regurgitation. No evidence of mitral valve stenosis. MV peak gradient, 8.4 mmHg. The mean mitral valve gradient is 5.0 mmHg. Tricuspid Valve: The tricuspid valve is normal in structure. Tricuspid valve regurgitation is mild . No evidence of tricuspid stenosis. Aortic Valve: The aortic valve is normal in structure. Aortic valve regurgitation is not visualized. Aortic valve sclerosis is present, with no evidence of aortic valve stenosis. Aortic valve mean gradient measures 1.0 mmHg. Aortic valve peak gradient measures 2.6 mmHg. Aortic valve area, by VTI measures 1.88 cm. Pulmonic Valve: The pulmonic valve was normal in structure. Pulmonic valve regurgitation is trivial. No evidence of pulmonic stenosis. Aorta: The aortic root is normal in size and structure. Venous: The inferior vena cava is normal in size with greater than 50% respiratory variability, suggesting right atrial pressure of 3 mmHg. IAS/Shunts: No atrial level shunt detected by color flow Doppler. Additional Comments: There is a moderate pleural effusion in the left lateral region.  LEFT VENTRICLE PLAX 2D LVIDd:         5.18 cm      Diastology LVIDs:         4.94 cm      LV e' medial:    7.72 cm/s LV PW:         0.96 cm      LV E/e' medial:  10.7 LV IVS:        1.14 cm      LV e' lateral:   6.96 cm/s LVOT diam:     2.00 cm      LV E/e' lateral: 11.9 LV SV:         21 LV SV Index:   12 LVOT Area:      3.14 cm  LV Volumes (MOD) LV vol d, MOD A2C: 171.0 ml LV vol d, MOD A4C: 178.0 ml LV vol s, MOD A2C: 130.0 ml LV vol s, MOD A4C: 139.0 ml LV SV MOD A2C:     41.0 ml LV SV MOD A4C:     178.0 ml LV SV MOD BP:      42.0 ml RIGHT VENTRICLE RV Basal diam:  4.87 cm RV S prime:     6.09 cm/s TAPSE (M-mode): 1.3 cm LEFT ATRIUM              Index        RIGHT ATRIUM           Index LA Vol (A2C):   108.0 ml 64.22 ml/m  RA Area:     13.00 cm LA Vol (A4C):   78.4 ml  46.62 ml/m  RA Volume:   34.00 ml  20.22 ml/m LA Biplane Vol: 94.7 ml  56.31 ml/m  AORTIC VALVE                    PULMONIC VALVE AV Area (Vmax):    1.61 cm     PR End Diast Vel: 11.56 msec AV Area (Vmean):   1.73 cm AV Area (VTI):  1.88 cm AV Vmax:           80.35 cm/s AV Vmean:          54.400 cm/s AV VTI:            0.109 m AV Peak Grad:      2.6 mmHg AV Mean Grad:      1.0 mmHg LVOT Vmax:         41.10 cm/s LVOT Vmean:        29.900 cm/s LVOT VTI:          0.065 m LVOT/AV VTI ratio: 0.60  AORTA Ao Root diam: 3.70 cm MITRAL VALVE                TRICUSPID VALVE MV Area (PHT): 7.90 cm     TR Peak grad:   35.8 mmHg MV Area VTI:   1.04 cm     TR Vmax:        299.00 cm/s MV Peak grad:  8.4 mmHg MV Mean grad:  5.0 mmHg     SHUNTS MV Vmax:       1.45 m/s     Systemic VTI:  0.07 m MV Vmean:      102.0 cm/s   Systemic Diam: 2.00 cm MV Decel Time: 96 msec MV E velocity: 82.70 cm/s MV A velocity: 121.00 cm/s MV E/A ratio:  0.68 Ida Rogue MD Electronically signed by Ida Rogue MD Signature Date/Time: 06/14/2022/1:41:02 PM    Final     Cardiac Studies   Echocardiogram yesterday showed an EF of 20 to 25% with mild pulmonary hypertension, moderate left pleural effusion and mild mitral regurgitation.  Patient Profile     76 y.o. male with a hx of CAD s/p CABG (LIMA-LAD, sequential SVG-ramus-OM, and SVG-acute marginal) and mitral valve repair (Edwards Physio II 84mm annuloplasty ring) in 08/2017, chronic HFrEF with recovered LVEF, persistent  Aflutter, NSVT, ESRD, stroke complicated by seizures, PAD complicated by gangrene and sepsis leading to R AKA 06/2021, HTN, HLD, DM2, and COPD who is being seen 06/14/2022 for the evaluation of elevated troponin in the setting of septic shock post left above-the-knee amputation.  Assessment & Plan    1.  Elevated troponin: Likely due to supply demand ischemia in the setting of hypotension and ventricular tachycardia.  Echocardiogram showed significant decrease in ejection fraction down to 20 to 25%. Considering his comorbidities and poor functional status including bilateral above-knee amputation, he is not a good candidate for ischemic cardiac evaluation.  Recommend optimizing medical therapy. I decreased the dose of midodrine in the setting of low EF.  If blood pressure allows, consider adding a small dose carvedilol.  2.  Atrial flutter: He seems to be in sinus rhythm.  Currently on Eliquis 2.5 mg twice daily.  3.  End-stage renal disease on hemodialysis  4.  Anemia: Improved hemoglobin with transfusion.  Overall prognosis is poor.    For questions or updates, please contact [place] Please consult [email or web address] for contact info under        Signed, Kathlyn Sacramento, MD  06/15/2022, 11:42 AM

## 2022-06-16 DIAGNOSIS — R001 Bradycardia, unspecified: Secondary | ICD-10-CM

## 2022-06-16 DIAGNOSIS — I70262 Atherosclerosis of native arteries of extremities with gangrene, left leg: Secondary | ICD-10-CM | POA: Diagnosis not present

## 2022-06-16 DIAGNOSIS — I4892 Unspecified atrial flutter: Secondary | ICD-10-CM | POA: Diagnosis not present

## 2022-06-16 DIAGNOSIS — I739 Peripheral vascular disease, unspecified: Secondary | ICD-10-CM

## 2022-06-16 DIAGNOSIS — I2510 Atherosclerotic heart disease of native coronary artery without angina pectoris: Secondary | ICD-10-CM

## 2022-06-16 DIAGNOSIS — F4329 Adjustment disorder with other symptoms: Secondary | ICD-10-CM | POA: Diagnosis not present

## 2022-06-16 DIAGNOSIS — I96 Gangrene, not elsewhere classified: Secondary | ICD-10-CM | POA: Diagnosis not present

## 2022-06-16 DIAGNOSIS — D638 Anemia in other chronic diseases classified elsewhere: Secondary | ICD-10-CM | POA: Diagnosis not present

## 2022-06-16 DIAGNOSIS — Z951 Presence of aortocoronary bypass graft: Secondary | ICD-10-CM

## 2022-06-16 DIAGNOSIS — I5043 Acute on chronic combined systolic (congestive) and diastolic (congestive) heart failure: Secondary | ICD-10-CM

## 2022-06-16 DIAGNOSIS — R748 Abnormal levels of other serum enzymes: Secondary | ICD-10-CM

## 2022-06-16 LAB — CBC
HCT: 32.6 % — ABNORMAL LOW (ref 39.0–52.0)
Hemoglobin: 10.3 g/dL — ABNORMAL LOW (ref 13.0–17.0)
MCH: 26.5 pg (ref 26.0–34.0)
MCHC: 31.6 g/dL (ref 30.0–36.0)
MCV: 83.8 fL (ref 80.0–100.0)
Platelets: 379 10*3/uL (ref 150–400)
RBC: 3.89 MIL/uL — ABNORMAL LOW (ref 4.22–5.81)
RDW: 20.7 % — ABNORMAL HIGH (ref 11.5–15.5)
WBC: 12.9 10*3/uL — ABNORMAL HIGH (ref 4.0–10.5)
nRBC: 0 % (ref 0.0–0.2)

## 2022-06-16 LAB — BASIC METABOLIC PANEL
Anion gap: 11 (ref 5–15)
Anion gap: 14 (ref 5–15)
BUN: 16 mg/dL (ref 8–23)
BUN: 17 mg/dL (ref 8–23)
CO2: 24 mmol/L (ref 22–32)
CO2: 21 mmol/L — ABNORMAL LOW (ref 22–32)
Calcium: 8.6 mg/dL — ABNORMAL LOW (ref 8.9–10.3)
Calcium: 8.8 mg/dL — ABNORMAL LOW (ref 8.9–10.3)
Chloride: 103 mmol/L (ref 98–111)
Chloride: 102 mmol/L (ref 98–111)
Creatinine, Ser: 2.04 mg/dL — ABNORMAL HIGH (ref 0.61–1.24)
Creatinine, Ser: 2.25 mg/dL — ABNORMAL HIGH (ref 0.61–1.24)
GFR, Estimated: 34 mL/min — ABNORMAL LOW (ref >60)
GFR, Estimated: 30 mL/min — ABNORMAL LOW (ref >60)
Glucose, Bld: 66 mg/dL — ABNORMAL LOW (ref 70–99)
Glucose, Bld: 72 mg/dL (ref 70–99)
Potassium: 3.7 mmol/L (ref 3.5–5.1)
Potassium: 3.8 mmol/L (ref 3.5–5.1)
Sodium: 138 mmol/L (ref 135–145)
Sodium: 137 mmol/L (ref 135–145)

## 2022-06-16 LAB — MAGNESIUM: Magnesium: 1.6 mg/dL — ABNORMAL LOW (ref 1.7–2.4)

## 2022-06-16 MED ORDER — MAGNESIUM SULFATE 2 GM/50ML IV SOLN
2.0000 g | Freq: Once | INTRAVENOUS | Status: AC
  Administered 2022-06-16: 2 g via INTRAVENOUS
  Filled 2022-06-16: qty 50

## 2022-06-16 NOTE — Progress Notes (Addendum)
Progress Note   Patient: Ernest [name] DOB: 22-Apr-1946 DOA: 06/02/2022     14 DOS: the patient was seen and examined on 06/16/2022   Brief hospital course: 76 year old man with chronic systolic congestive heart failure, COPD, end-stage renal disease, hepatitis C, hypertension, stroke.  Patient brought in by family secondary to gangrene left foot.  Started empirically on antibiotics.  On 06/04/2022 patient refused dialysis and refused medications.  Patient declined amputation at this time.  Appreciate palliative care consultation and patient was made a DO NOT RESUSCITATE. Patient is seen by palliative care for poor prognosis.  8/23.  Patient was agreeable and dialyzed today. 8/25.  Left AKA 8/29: Patient was hypotensive requiring transfer to ICU for pressors 8/30: Weaned off pressor, DNR, troponin trending up 8/31: Daughter is not ready for comfort care/hospice yet and wants aggressive management 9/2: Still in stepdown, no significant change. May need second agent for hospital associated aspiration.   Assessment and Plan: Dry gangrene ([place]) Septic shock ([place]) Initially septic 2/2 gangrene left foot.  Status post left AKA on 8/25. He was doing well but had persistent hypotension requiring transfer to ICU/stepdown.  He is now off IV pressors, though on midodrine, this is likely due to aspiration pneumonia. Blood pressures are stable.  Continue IV Zosyn, which was started 8/29. Blood cultures negative. BPs are obtained from a-line, currently MAPs in the upper 60s. Stable on IV antibiotics, but not with significant improvement; will continue IV antibiotics. He may need a second antimicrobial if aggressive medical management is being pursued.  -8/29 BC NTGD -zosyn started 8/29- -vanc 8/29, d/c after negative MRSA  Chronic HFrEF (heart failure with reduced ejection fraction) ([place]) Well compensated.  Cardiology following, echo shows EF of 20 to 25% which is a drop from before when it  was 50%. No ischemic evaluation given overall poor prognosis. Unable to add GDMT due to low Bps.  -cardiology following, appreciate recommendations   End-stage renal disease on hemodialysis ([place]) Secondary hyperparathyroidism of renal origin ([place]) Hypokalemia -renal following, HD MWF   Vascular dementia ([place]) More stable for now he does get intermittent agitation.   Pressure injury of skin Wound care consultation.  Stage II gluteal cleft.   Protein-calorie malnutrition, severe End-of-life care Patient appears to be in the process of dying. Palliative has been consulted and has seen patient. Per their note MOST form consistent with DNR and limited measured at the initial consult on 8/22 but daughter changed her mind 8/31 and wants aggressive medical care short of CPR.    Anemia of chronic disease Hemoglobin 11.1-> 7.1->9.2>9.4. Unsure if this acute drop is due to recent procedure/surgery related blood loss. Status post 1 PRBC transfusion, hgb stable since, will transition back to home eliquis   Atrial flutter ([facility]) Switch back to eliquis as above   Elevated troponin Likely due to demand ischemia from severe hypotension, tachycardia transient V. tach.  Troponin peaked 2100 and now trending down Echo showed EF dropped from 50-> 20 to 25% with global hypokinesis -Cardiology following and appreciate their input -Not a good candidate for ischemic evaluation at this current time -d/c heparin today   Seizure, late effect of stroke ([place]) On IV Keppra.       Subjective: Minimally responsive.   Physical Exam: Vitals:   06/16/22 1100 06/16/22 1200 06/16/22 1300 06/16/22 1400  BP:      Pulse:  (!) 111 (!) 110 (!) 112  Resp: (!) 26 (!) 9 12 12   Temp:  98.4  F (36.9 C)    TempSrc:  Axillary    SpO2:  100% 100% 98%  Weight:      Height:       General exam: Ill-appearing, cachectic. Respiratory system: Decreased breathing sounds without crackles at bases. Respiratory effort  normal. Cardiovascular system: Regular and tachycardic..= Gastrointestinal system: Abdomen is benign Central nervous system: Lethargic,Nonfocal Extremities: Bilateral BKA, no s/s of infection on stump Psychiatry: calm Access - right thigh permcath  Data Reviewed:      Latest Ref Rng & Units 06/16/2022    6:18 AM 06/15/2022    5:02 AM 06/14/2022    5:25 AM  CBC  WBC 4.0 - 10.5 K/uL 12.9  12.3  12.7   Hemoglobin 13.0 - 17.0 g/dL 10.3  9.4  9.2   Hematocrit 39.0 - 52.0 % 32.6  29.3  28.2   Platelets 150 - 400 K/uL 379  335  295       Latest Ref Rng & Units 06/16/2022   12:32 PM 06/16/2022    6:18 AM 06/15/2022    5:02 AM  BMP  Glucose 70 - 99 mg/dL 72  66  104   BUN 8 - 23 mg/dL 17  16  27    Creatinine 0.61 - 1.24 mg/dL 2.25  2.04  3.00   Sodium 135 - 145 mmol/L 137  138  136   Potassium 3.5 - 5.1 mmol/L 3.8  3.7  3.9   Chloride 98 - 111 mmol/L 102  103  103   CO2 22 - 32 mmol/L 21  24  26    Calcium 8.9 - 10.3 mg/dL 8.8  8.6  8.8    Mg: 1.6  Family Communication: d/w brother at bedside, updated [place]   Disposition: Status is: Inpatient Remains inpatient appropriate because: severity of illness  Planned Discharge Destination:  TBD    Time spent: 55 minutes  Author: Lorelei Pont, MD 06/16/2022 4:00 PM  For on call review [email or web address].

## 2022-06-16 NOTE — Progress Notes (Signed)
[facility]  ROUNDING NOTE   Subjective:   Ernest [name] is a 76 year old African-American male with past medical conditions including CAD, COPD, CHF, diabetes, stroke with late seizures, hypertension, and end-stage renal disease on hemodialysis.  Patient presents to the emergency department from [facility] with concerns of left foot wound and has been admitted for Dry gangrene [facility]) [[id number]]  Patient is known to our practice from previous admissions and receives outpatient dialysis treatments at [facility] on a MWF schedule, supervised by [facility] physicians.    Update: Patient seen at bedside.  Remains in ICU.    Objective:  Vital signs in last 24 hours:  Temp:  [97.9 F (36.6 C)-98.2 F (36.8 C)] 98 F (36.7 C) (09/02 0800) Pulse Rate:  [88-123] 107 (09/02 0000) Resp:  [9-26] 26 (09/02 1100) BP: (88-119)/(40-84) 103/45 (09/01 2152) SpO2:  [97 %-100 %] 98 % (09/02 0800) Arterial Line BP: (94-143)/(45-67) 120/52 (09/02 1100)  Weight change:  Filed Weights   06/02/22 2026 06/06/22 0809 06/06/22 1230  Weight: 57.9 kg 56.9 kg 55.9 kg    Intake/Output: I/O last 3 completed shifts: In: 634.4 [I.V.:144.6; IV Piggyback:489.8] Out: 600 [Other:600]   Intake/Output this shift:  Total I/O In: 100 [IV Piggyback:100] Out: -   Physical Exam: General: NAD  Head: Normocephalic, atraumatic. Moist oral mucosal membranes  Eyes: Anicteric  Lungs:  Coarse breath sounds,  normal effort  Heart: regular rhythm, tachycardic   Abdomen:  Soft, nontender, nondistended  Extremities: No peripheral edema.  Bilateral AKA  Neurologic: Arousable  Skin: Warm, dry  Access: Right thigh PermCath    Basic Metabolic Panel: Recent Labs  Lab 06/11/22 0924 06/12/22 1119 06/14/22 0525 06/15/22 0502 06/16/22 0618  NA 132* 136 136 136 138  K 4.6 4.4 3.1* 3.9 3.7  CL 95* 101 100 103 103  CO2 27 27 26 26 24   GLUCOSE 187* 120* 109* 104* 66*  BUN 49* 33* 21 27* 16   CREATININE 4.33* 3.19* 2.20* 3.00* 2.04*  CALCIUM 8.7* 9.1 8.7* 8.8* 8.6*  MG  --  1.9  --   --   --   PHOS 3.5 2.9  --   --   --      Liver Function Tests: Recent Labs  Lab 06/11/22 0924  ALBUMIN 2.1*    No results for input(s): "LIPASE", "AMYLASE" in the last 168 hours. No results for input(s): "AMMONIA" in the last 168 hours.  CBC: Recent Labs  Lab 06/12/22 1119 06/12/22 1650 06/14/22 0525 06/15/22 0502 06/16/22 0618  WBC 15.8* 14.6* 12.7* 12.3* 12.9*  NEUTROABS  --  13.2*  --   --   --   HGB 8.0* 7.1* 9.2* 9.4* 10.3*  HCT 25.4* 22.3* 28.2* 29.3* 32.6*  MCV 79.9* 80.8 80.8 82.1 83.8  PLT 385 292 295 335 379     Cardiac Enzymes: No results for input(s): "CKTOTAL", "CKMB", "CKMBINDEX", "TROPONINI" in the last 168 hours.  BNP: Invalid input(s): "POCBNP"  CBG: Recent Labs  Lab 06/12/22 1229 06/12/22 1344  GLUCAP 116* 146*     Microbiology: Results for orders placed or performed during the hospital encounter of 06/02/22  Blood culture (routine x 2)     Status: None   Collection Time: 06/02/22  4:24 PM   Specimen: BLOOD  Result Value Ref Range Status   Specimen Description BLOOD LEFT ANTECUBITAL  Final   Special Requests   Final    BOTTLES DRAWN AEROBIC AND ANAEROBIC Blood Culture results may not  be optimal due to an inadequate volume of blood received in culture bottles   Culture   Final    NO GROWTH 5 DAYS Performed at [facility], [address] [place], [place] [address]    Report Status 06/07/2022 FINAL  Final  Blood culture (routine x 2)     Status: None   Collection Time: 06/02/22  8:45 PM   Specimen: BLOOD LEFT HAND  Result Value Ref Range Status   Specimen Description BLOOD LEFT HAND  Final   Special Requests   Final    BOTTLES DRAWN AEROBIC AND ANAEROBIC Blood Culture adequate volume   Culture   Final    NO GROWTH 5 DAYS Performed at [facility], [address] [place], [place] [address]    Report Status  06/07/2022 FINAL  Final  Culture, blood (Routine X 2) w Reflex to ID Panel     Status: None (Preliminary result)   Collection Time: 06/12/22 11:20 AM   Specimen: BLOOD  Result Value Ref Range Status   Specimen Description BLOOD BLOOD RIGHT HAND  Final   Special Requests   Final    BOTTLES DRAWN AEROBIC AND ANAEROBIC Blood Culture adequate volume   Culture   Final    NO GROWTH 4 DAYS Performed at [facility], [address], [place], [place] [address]    Report Status PENDING  Incomplete  MRSA Next Gen by PCR, Nasal     Status: None   Collection Time: 06/12/22 12:34 PM   Specimen: Nasal Mucosa; Nasal Swab  Result Value Ref Range Status   MRSA by PCR Next Gen NOT DETECTED NOT DETECTED Final    Comment: (NOTE) The GeneXpert MRSA Assay (FDA approved for NASAL specimens only), is one component of a comprehensive MRSA colonization surveillance program. It is not intended to diagnose MRSA infection nor to guide or monitor treatment for MRSA infections. Test performance is not FDA approved in patients less than 51 years old. Performed at [facility], [address] [place], [place] [address]   Culture, blood (Routine X 2) w Reflex to ID Panel     Status: None (Preliminary result)   Collection Time: 06/12/22 12:36 PM   Specimen: BLOOD RIGHT HAND  Result Value Ref Range Status   Specimen Description BLOOD RIGHT HAND  Final   Special Requests   Final    BOTTLES DRAWN AEROBIC AND ANAEROBIC Blood Culture adequate volume   Culture   Final    NO GROWTH 4 DAYS Performed at [facility], [address], [place], [place] [address]    Report Status PENDING  Incomplete    Coagulation Studies: No results for input(s): "LABPROT", "INR" in the last 72 hours.  Urinalysis: No results for input(s): "COLORURINE", "LABSPEC", "PHURINE", "GLUCOSEU", "HGBUR", "BILIRUBINUR", "KETONESUR", "PROTEINUR", "UROBILINOGEN", "NITRITE", "LEUKOCYTESUR" in the last 72  hours.  Invalid input(s): "APPERANCEUR"    Imaging: No results found.   Medications:    levETIRAcetam Stopped (06/16/22 0925)   levETIRAcetam 500 mg (06/15/22 2203)   piperacillin-tazobactam (ZOSYN)  IV 2.25 g (06/16/22 1660)    (feeding supplement) PROSource Plus  30 mL Oral TID BM   apixaban  2.5 mg Oral BID   vitamin C  500 mg Oral BID   atorvastatin  40 mg Oral Daily   Chlorhexidine Gluconate Cloth  6 each Topical Daily   Chlorhexidine Gluconate Cloth  6 each Topical Once   DULoxetine  40 mg Oral BID   epoetin (EPOGEN/PROCRIT) injection  4,000 Units Intravenous Q M,W,F-HD   feeding supplement (NEPRO CARB STEADY)  237 mL Oral TID BM   midodrine  5 mg Oral TID WC   multivitamin  1 tablet Oral QHS   mouth rinse  15 mL Mouth Rinse 4 times per day   pantoprazole (PROTONIX) IV  40 mg Intravenous Q24H   polyethylene glycol  17 g Oral Daily   senna-docusate  1 tablet Oral QHS   sevelamer carbonate  800 mg Oral TID WC   zinc sulfate  220 mg Oral Daily   haloperidol lactate, HYDROmorphone (DILAUDID) injection, ondansetron **OR** ondansetron (ZOFRAN) IV, ondansetron (ZOFRAN) IV, mouth rinse, oxyCODONE-acetaminophen  Assessment/ Plan:  [name] is a 76 y.o.  male  with past medical conditions including CAD, COPD, CHF, diabetes, stroke with late seizures, hypertension, and end-stage renal disease on hemodialysis.  Patient presents to the emergency department from [facility] with concerns of left foot wound and has been admitted for 06/02/2022  3:25 PM   [facility]/MWF/right thigh PermCath  End-stage renal disease on hemodialysis.   No acute indication for HD today   2. Anemia of chronic kidney disease Lab Results  Component Value Date   HGB 10.3 (L) 06/16/2022    Hgb close to goal at 9.4, continue epogen.   3. Secondary Hyperparathyroidism: Lab Results  Component Value Date   PTH 63 10/05/2017   PTH Comment 10/05/2017   CALCIUM 8.6 (L)  06/16/2022   CAION 1.05 (L) 07/12/2020   PHOS 2.9 06/12/2022   Continue sevelamer with meals.   4.  Hypertension with chronic kidney disease.  Home regimen includes amlodipine. Now on midodrine only.    5.  Dry gangrene,  on left foot.  Patient was admitted with dry gangrene.  Patient is now s/p AKA Vascular surgery and primary team are following     LOS: [place] 9/2/202312:09 PM

## 2022-06-16 NOTE — Progress Notes (Signed)
Rounding Note    Patient Name: Ernest [name] Date of Encounter: 06/16/2022  [place] Cardiologist: Nelva Bush, MD   Subjective   Patient is awake and responding to questions. He denies pain. HR sustained 120-130s.  Inpatient Medications    Scheduled Meds:  (feeding supplement) PROSource Plus  30 mL Oral TID BM   apixaban  2.5 mg Oral BID   vitamin C  500 mg Oral BID   atorvastatin  40 mg Oral Daily   Chlorhexidine Gluconate Cloth  6 each Topical Daily   Chlorhexidine Gluconate Cloth  6 each Topical Once   DULoxetine  40 mg Oral BID   epoetin (EPOGEN/PROCRIT) injection  4,000 Units Intravenous Q M,W,F-HD   feeding supplement (NEPRO CARB STEADY)  237 mL Oral TID BM   midodrine  5 mg Oral TID WC   multivitamin  1 tablet Oral QHS   mouth rinse  15 mL Mouth Rinse 4 times per day   pantoprazole (PROTONIX) IV  40 mg Intravenous Q24H   polyethylene glycol  17 g Oral Daily   senna-docusate  1 tablet Oral QHS   sevelamer carbonate  800 mg Oral TID WC   zinc sulfate  220 mg Oral Daily   Continuous Infusions:  levETIRAcetam Stopped (06/16/22 0925)   levETIRAcetam 500 mg (06/15/22 2203)   piperacillin-tazobactam (ZOSYN)  IV 2.25 g (06/16/22 0619)   PRN Meds: haloperidol lactate, HYDROmorphone (DILAUDID) injection, ondansetron **OR** ondansetron (ZOFRAN) IV, ondansetron (ZOFRAN) IV, mouth rinse, oxyCODONE-acetaminophen   Vital Signs    Vitals:   06/16/22 0900 06/16/22 0910 06/16/22 1000 06/16/22 1100  BP:      Pulse:      Resp: 11 (!) 22 16 (!) 26  Temp:      TempSrc:      SpO2:      Weight:      Height:        Intake/Output Summary (Last 24 hours) at 06/16/2022 1114 Last data filed at 06/16/2022 1000 Gross per 24 hour  Intake 250 ml  Output 600 ml  Net -350 ml      06/06/2022   12:30 PM 06/06/2022    8:09 AM 06/02/2022    8:26 PM  Last 3 Weights  Weight (lbs) 123 lb 3.8 oz 125 lb 7.1 oz 127 lb 10.3 oz  Weight (kg) 55.9 kg 56.9 kg 57.9 kg       Telemetry    ST>?aflutter 120-130s - Personally Reviewed  ECG    pending - Personally Reviewed  Physical Exam   GEN: No acute distress.   Neck: No JVD Cardiac: tachy, no murmurs, rubs, or gallops.  Respiratory: Clear to auscultation bilaterally. GI: Soft, nontender, non-distended  MS: No edema; b/l AKA Neuro:  Nonfocal  Psych: Normal affect   Labs    High Sensitivity Troponin:   Recent Labs  Lab 06/12/22 1119 06/12/22 1644 06/12/22 1950 06/13/22 1954 06/13/22 2137  TROPONINIHS 577* 1,183* 1,505* 2,114* 2,025*     Chemistry Recent Labs  Lab 06/11/22 0924 06/12/22 1119 06/14/22 0525 06/15/22 0502 06/16/22 0618  NA 132* 136 136 136 138  K 4.6 4.4 3.1* 3.9 3.7  CL 95* 101 100 103 103  CO2 27 27 26 26 24   GLUCOSE 187* 120* 109* 104* 66*  BUN 49* 33* 21 27* 16  CREATININE 4.33* 3.19* 2.20* 3.00* 2.04*  CALCIUM 8.7* 9.1 8.7* 8.8* 8.6*  MG  --  1.9  --   --   --  ALBUMIN 2.1*  --   --   --   --   GFRNONAA 14* 20* 31* 21* 34*  ANIONGAP 10 8 10 7 11     Lipids No results for input(s): "CHOL", "TRIG", "HDL", "LABVLDL", "LDLCALC", "CHOLHDL" in the last 168 hours.  Hematology Recent Labs  Lab 06/14/22 0525 06/15/22 0502 06/16/22 0618  WBC 12.7* 12.3* 12.9*  RBC 3.49* 3.57* 3.89*  HGB 9.2* 9.4* 10.3*  HCT 28.2* 29.3* 32.6*  MCV 80.8 82.1 83.8  MCH 26.4 26.3 26.5  MCHC 32.6 32.1 31.6  RDW 19.6* 20.0* 20.7*  PLT 295 335 379   Thyroid No results for input(s): "TSH", "FREET4" in the last 168 hours.  BNP Recent Labs  Lab 06/12/22 1119  BNP >4,500.0*    DDimer No results for input(s): "DDIMER" in the last 168 hours.   Radiology    No results found.  Cardiac Studies   Echocardiogram showed an EF of 20 to 25% with mild pulmonary hypertension, moderate left pleural effusion and mild mitral regurgitation.  Patient Profile     76 y.o. male with a hx of CAD s/p CABG (LIMA-LAD, sequential SVG-ramus-OM, and SVG-acute marginal) and mitral valve repair  (Edwards Physio II 53mm annuloplasty ring) in 08/2017, chronic HFrEF with recovered LVEF, persistent Aflutter, NSVT, ESRD, stroke complicated by seizures, PAD complicated by gangrene and sepsis leading to R AKA 06/2021, HTN, HLD, DM2, and COPD who is being seen 06/14/2022 for the evaluation of elevated troponin in the setting of septic shock post left above-the-knee amputation.  Assessment & Plan    Elevated troponin - suspect demand ischemia in the setting of hypotension and VT - Echo showed LVEF 20-25% - not a good candidate for heart cath given poor functional status, b/l AKA, and other comorbidities - plan to continue medical management  Aflutter - has been in NSR, now HR elevated to 130s sustained - check EKG - Continue Eliquis 2.5mg  BID - BP low on midodrine  ESRD on HD - per nephrology  Anemia - improved with PRBCs  For questions or updates, please contact [place] Please consult [email or web address] for contact info under        Signed, Toria Monte Ninfa Meeker, [name]  06/16/2022, 11:14 AM

## 2022-06-16 NOTE — Plan of Care (Signed)
  Problem: Clinical Measurements: Goal: Will remain free from infection Outcome: Progressing Goal: Diagnostic test results will improve Outcome: Progressing Goal: Respiratory complications will improve Outcome: Progressing Goal: Cardiovascular complication will be avoided Outcome: Progressing   Problem: Elimination: Goal: Will not experience complications related to bowel motility Outcome: Progressing   Problem: Skin Integrity: Goal: Risk for impaired skin integrity will decrease Outcome: Progressing   Problem: Activity: Goal: Risk for activity intolerance will decrease Outcome: Not Progressing   Problem: Nutrition: Goal: Adequate nutrition will be maintained Outcome: Not Progressing   Problem: Elimination: Goal: Will not experience complications related to urinary retention Outcome: Not Progressing

## 2022-06-17 DIAGNOSIS — F4329 Adjustment disorder with other symptoms: Secondary | ICD-10-CM | POA: Diagnosis not present

## 2022-06-17 DIAGNOSIS — Z89611 Acquired absence of right leg above knee: Secondary | ICD-10-CM | POA: Diagnosis not present

## 2022-06-17 DIAGNOSIS — I4892 Unspecified atrial flutter: Secondary | ICD-10-CM | POA: Diagnosis not present

## 2022-06-17 DIAGNOSIS — I5022 Chronic systolic (congestive) heart failure: Secondary | ICD-10-CM | POA: Diagnosis not present

## 2022-06-17 DIAGNOSIS — D638 Anemia in other chronic diseases classified elsewhere: Secondary | ICD-10-CM | POA: Diagnosis not present

## 2022-06-17 LAB — CULTURE, BLOOD (ROUTINE X 2)
Culture: NO GROWTH
Culture: NO GROWTH
Special Requests: ADEQUATE
Special Requests: ADEQUATE

## 2022-06-17 LAB — CBC
HCT: 32.6 % — ABNORMAL LOW (ref 39.0–52.0)
HCT: 32.9 % — ABNORMAL LOW (ref 39.0–52.0)
Hemoglobin: 10.2 g/dL — ABNORMAL LOW (ref 13.0–17.0)
Hemoglobin: 10.3 g/dL — ABNORMAL LOW (ref 13.0–17.0)
MCH: 27 pg (ref 26.0–34.0)
MCH: 26.8 pg (ref 26.0–34.0)
MCHC: 31.3 g/dL (ref 30.0–36.0)
MCHC: 31.3 g/dL (ref 30.0–36.0)
MCV: 86.2 fL (ref 80.0–100.0)
MCV: 85.5 fL (ref 80.0–100.0)
Platelets: 408 10*3/uL — ABNORMAL HIGH (ref 150–400)
Platelets: 426 10*3/uL — ABNORMAL HIGH (ref 150–400)
RBC: 3.78 MIL/uL — ABNORMAL LOW (ref 4.22–5.81)
RBC: 3.85 MIL/uL — ABNORMAL LOW (ref 4.22–5.81)
RDW: 21.4 % — ABNORMAL HIGH (ref 11.5–15.5)
RDW: 21.4 % — ABNORMAL HIGH (ref 11.5–15.5)
WBC: 15.2 10*3/uL — ABNORMAL HIGH (ref 4.0–10.5)
WBC: 14.6 10*3/uL — ABNORMAL HIGH (ref 4.0–10.5)
nRBC: 0 % (ref 0.0–0.2)
nRBC: 0 % (ref 0.0–0.2)

## 2022-06-17 LAB — BASIC METABOLIC PANEL
Anion gap: 8 (ref 5–15)
Anion gap: 12 (ref 5–15)
BUN: 23 mg/dL (ref 8–23)
BUN: 26 mg/dL — ABNORMAL HIGH (ref 8–23)
CO2: 26 mmol/L (ref 22–32)
CO2: 27 mmol/L (ref 22–32)
Calcium: 9 mg/dL (ref 8.9–10.3)
Calcium: 8.5 mg/dL — ABNORMAL LOW (ref 8.9–10.3)
Chloride: 104 mmol/L (ref 98–111)
Chloride: 96 mmol/L — ABNORMAL LOW (ref 98–111)
Creatinine, Ser: 2.82 mg/dL — ABNORMAL HIGH (ref 0.61–1.24)
Creatinine, Ser: 3.07 mg/dL — ABNORMAL HIGH (ref 0.61–1.24)
GFR, Estimated: 23 mL/min — ABNORMAL LOW (ref >60)
GFR, Estimated: 21 mL/min — ABNORMAL LOW (ref >60)
Glucose, Bld: 103 mg/dL — ABNORMAL HIGH (ref 70–99)
Glucose, Bld: 124 mg/dL — ABNORMAL HIGH (ref 70–99)
Potassium: 3.6 mmol/L (ref 3.5–5.1)
Potassium: 3.4 mmol/L — ABNORMAL LOW (ref 3.5–5.1)
Sodium: 138 mmol/L (ref 135–145)
Sodium: 135 mmol/L (ref 135–145)

## 2022-06-17 LAB — HEPATIC FUNCTION PANEL
ALT: 10 U/L (ref 0–44)
AST: 16 U/L (ref 15–41)
Albumin: 2.1 g/dL — ABNORMAL LOW (ref 3.5–5.0)
Alkaline Phosphatase: 85 U/L (ref 38–126)
Bilirubin, Direct: <0.1 mg/dL (ref 0.0–0.2)
Total Bilirubin: 1.1 mg/dL (ref 0.3–1.2)
Total Protein: 5.4 g/dL — ABNORMAL LOW (ref 6.5–8.1)

## 2022-06-17 LAB — CK: Total CK: 17 U/L — ABNORMAL LOW (ref 49–397)

## 2022-06-17 LAB — LACTIC ACID, PLASMA: Lactic Acid, Venous: 0.8 mmol/L (ref 0.5–1.9)

## 2022-06-17 LAB — MAGNESIUM: Magnesium: 2.1 mg/dL (ref 1.7–2.4)

## 2022-06-17 LAB — BRAIN NATRIURETIC PEPTIDE: B Natriuretic Peptide: >4500 pg/mL — ABNORMAL HIGH (ref 0.0–100.0)

## 2022-06-17 MED ORDER — METOPROLOL TARTRATE 25 MG PO TABS: 12.5000 mg | ORAL_TABLET | Freq: Two times a day (BID) | ORAL | Status: DC

## 2022-06-17 MED ORDER — MIDODRINE HCL 5 MG PO TABS
10.0000 mg | ORAL_TABLET | Freq: Three times a day (TID) | ORAL | Status: DC
  Administered 2022-06-17 – 2022-06-22 (×10): 10 mg via ORAL
  Filled 2022-06-17 (×14): qty 2

## 2022-06-17 NOTE — Progress Notes (Signed)
Rounding Note    Patient Name: Ernest [name] Date of Encounter: 06/17/2022  [place] Cardiologist: Nelva Bush, MD   Subjective   Ernest [name] reports no current pain and denies any issues with his breathing. He denies experiencing any pain in the leg where he underwent surgery. The patient does not report feeling any palpitations or discomfort related to his elevated heart rate.  Inpatient Medications    Scheduled Meds:  (feeding supplement) PROSource Plus  30 mL Oral TID BM   apixaban  2.5 mg Oral BID   vitamin C  500 mg Oral BID   atorvastatin  40 mg Oral Daily   Chlorhexidine Gluconate Cloth  6 each Topical Daily   Chlorhexidine Gluconate Cloth  6 each Topical Once   DULoxetine  40 mg Oral BID   epoetin (EPOGEN/PROCRIT) injection  4,000 Units Intravenous Q M,W,F-HD   feeding supplement (NEPRO CARB STEADY)  237 mL Oral TID BM   midodrine  5 mg Oral TID WC   multivitamin  1 tablet Oral QHS   mouth rinse  15 mL Mouth Rinse 4 times per day   pantoprazole (PROTONIX) IV  40 mg Intravenous Q24H   polyethylene glycol  17 g Oral Daily   senna-docusate  1 tablet Oral QHS   sevelamer carbonate  800 mg Oral TID WC   zinc sulfate  220 mg Oral Daily   Continuous Infusions:  levETIRAcetam Stopped (06/17/22 0263)   levETIRAcetam Stopped (06/15/22 2218)   piperacillin-tazobactam (ZOSYN)  IV 2.25 g (06/17/22 1438)   PRN Meds: haloperidol lactate, HYDROmorphone (DILAUDID) injection, ondansetron **OR** ondansetron (ZOFRAN) IV, ondansetron (ZOFRAN) IV, mouth rinse, oxyCODONE-acetaminophen   Vital Signs    Vitals:   06/17/22 0810 06/17/22 1000 06/17/22 1200 06/17/22 1300  BP: (!) 123/51 131/62    Pulse: 99 (!) 102 (!) 103 (!) 106  Resp: (!) 24 16 16 19   Temp: 97.6 F (36.4 C)     TempSrc: Oral     SpO2: 99% 97% 96% 96%  Weight:      Height:        Intake/Output Summary (Last 24 hours) at 06/17/2022 1443 Last data filed at 06/17/2022 0900 Gross per 24 hour   Intake 250 ml  Output --  Net 250 ml      06/06/2022   12:30 PM 06/06/2022    8:09 AM 06/02/2022    8:26 PM  Last 3 Weights  Weight (lbs) 123 lb 3.8 oz 125 lb 7.1 oz 127 lb 10.3 oz  Weight (kg) 55.9 kg 56.9 kg 57.9 kg      Telemetry    Sinus tachycardia.  Rate 100-110s - Personally Reviewed  ECG    N/a - Personally Reviewed  Physical Exam   VS:  BP 131/62   Pulse (!) 106   Temp 97.6 F (36.4 C) (Oral)   Resp 19   Ht 5\' 9"  (1.753 m)   Wt 55.9 kg   SpO2 96%   BMI 18.20 kg/m  , BMI Body mass index is 18.2 kg/m. GENERAL:  Chronically ill-appearing HEENT: Pupils equal round and reactive, fundi not visualized, oral mucosa unremarkable NECK:  No jugular venous distention, waveform within normal limits, carotid upstroke brisk and symmetric, no bruits, no thyromegaly LUNGS:  Clear to auscultation bilaterally HEART:  Tachycardic.  Regular rhythm.  PMI not displaced or sustained,S1 and S2 within normal limits, no S3, no S4, no clicks, no rubs, no murmurs ABD:  Flat, positive bowel sounds normal  in frequency in pitch, no bruits, no rebound, no guarding, no midline pulsatile mass, no hepatomegaly, no splenomegaly EXT:  Bilateral AKA.  L incision C/D/I SKIN:  No rashes no nodules NEURO:  Cranial nerves II through XII grossly intact, motor grossly intact throughout PSYCH:  Cognitively intact, oriented to person place and time   Labs    High Sensitivity Troponin:   Recent Labs  Lab 06/12/22 1119 06/12/22 1644 06/12/22 1950 06/13/22 1954 06/13/22 2137  TROPONINIHS 577* 1,183* 1,505* 2,114* 2,025*     Chemistry Recent Labs  Lab 06/11/22 5093 06/12/22 1119 06/14/22 0525 06/16/22 0618 06/16/22 1232 06/17/22 0434  NA 132* 136   < > 138 137 138  K 4.6 4.4   < > 3.7 3.8 3.6  CL 95* 101   < > 103 102 104  CO2 27 27   < > 24 21* 26  GLUCOSE 187* 120*   < > 66* 72 103*  BUN 49* 33*   < > 16 17 23   CREATININE 4.33* 3.19*   < > 2.04* 2.25* 2.82*  CALCIUM 8.7* 9.1   < >  8.6* 8.8* 9.0  MG  --  1.9  --   --  1.6* 2.1  ALBUMIN 2.1*  --   --   --   --   --   GFRNONAA 14* 20*   < > 34* 30* 23*  ANIONGAP 10 8   < > 11 14 8    < > = values in this interval not displayed.    Lipids No results for input(s): "CHOL", "TRIG", "HDL", "LABVLDL", "LDLCALC", "CHOLHDL" in the last 168 hours.  Hematology Recent Labs  Lab 06/15/22 0502 06/16/22 0618 06/17/22 0434  WBC 12.3* 12.9* 15.2*  RBC 3.57* 3.89* 3.78*  HGB 9.4* 10.3* 10.2*  HCT 29.3* 32.6* 32.6*  MCV 82.1 83.8 86.2  MCH 26.3 26.5 27.0  MCHC 32.1 31.6 31.3  RDW 20.0* 20.7* 21.4*  PLT 335 379 408*   Thyroid No results for input(s): "TSH", "FREET4" in the last 168 hours.  BNP Recent Labs  Lab 06/12/22 1119  BNP >4,500.0*    DDimer No results for input(s): "DDIMER" in the last 168 hours.   Radiology    No results found.  Cardiac Studies   Echo 06/14/22:  1. Left ventricular ejection fraction, by estimation, is 20 to 25%. The  left ventricle has severely decreased function. The left ventricle  demonstrates global hypokinesis. Indeterminate diastolic filling due to  E-A fusion.   2. Right ventricular systolic function is moderately reduced. The right  ventricular size is moderately enlarged. There is mildly elevated  pulmonary artery systolic pressure. The estimated right ventricular  systolic pressure is 26.7 mmHg.   3. Moderate pleural effusion in the left lateral region.   4. The mitral valve is normal in structure. Mild mitral valve  regurgitation. No evidence of mitral stenosis.   5. The aortic valve is normal in structure. Aortic valve regurgitation is  not visualized. Aortic valve sclerosis is present, with no evidence of  aortic valve stenosis.   6. The inferior vena cava is normal in size with greater than 50%  respiratory variability, suggesting right atrial pressure of 3 mmHg.   Patient Profile     76 y.o. male with ESRD on HD, CAD status post CABG, status post mitral valve repair,  chronic systolic and diastolic heart failure with recovered LVEF, persistent atrial flutter, NSVT, CVA, seizures, hypertension, hyperlipidemia, diabetes, PAD status post bilateral AKA, and  COPD here with septic shock following left AKA.  Assessment & Plan    # Acute on chronic systolic diastolic heart failure:  Previously had systolic dysfunction that recovered.  Echo 06/2021 revealed LVEF 55%.  This admission it was 20-25%. -Volume status managed with HD  -Not a candidate for ischemic evaluation given his comorbidities and lack of symptoms -Currently on midodrine for blood pressure support - MAPs in the 80s on his arterial line.  Will start metoprolol 12.5mg  bid and consolidate to succinate if BP tolerates   #CAD status post CABG: #NSTEMI: -High sensitive troponin elevated to a peak of 2114 -Currently chest pain-free -He is not on aspirin given that he is on Eliquis -No plans for cath as above -Add low-dose metoprolol as above   # Atrial flutter: - Currently in sinus tachycardia -Continue Eliquis - start metoprolol as above  # Sepsis:  # Gangrene s/p L AKA: - Continue midodrine and antibiotics per critical care   # PAD:  Continue statin   # ESRD on HD: Per nephrology     For questions or updates, please contact [place] Please consult [email or web address] for contact info under        Signed, Skeet Latch, MD  06/17/2022, 2:43 PM

## 2022-06-17 NOTE — Progress Notes (Signed)
[facility]  ROUNDING NOTE   Subjective:   Ernest [name] is a 76 year old African-American male with past medical conditions including CAD, COPD, CHF, diabetes, stroke with late seizures, hypertension, and end-stage renal disease on hemodialysis.  Patient presents to the emergency department from [facility] with concerns of left foot wound and has been admitted for Dry gangrene [name]) [I96]  Patient is known to our practice from previous admissions and receives outpatient dialysis treatments at [name] on a MWF schedule, supervised by [facility] physicians.    Update: Patient seen at bedside.  Remains in ICU.    Objective:  Vital signs in last 24 hours:  Temp:  [97.6 F (36.4 C)-98.4 F (36.9 C)] 97.6 F (36.4 C) (09/03 0810) Pulse Rate:  [99-113] 99 (09/03 0810) Resp:  [8-24] 24 (09/03 0810) BP: (123)/(51) 123/51 (09/03 0810) SpO2:  [98 %-100 %] 99 % (09/03 0810) Arterial Line BP: (106-133)/(40-60) 122/51 (09/03 0810)  Weight change:  Filed Weights   06/02/22 2026 06/06/22 0809 06/06/22 1230  Weight: 57.9 kg 56.9 kg 55.9 kg    Intake/Output: I/O last 3 completed shifts: In: 200 [IV Piggyback:200] Out: 600 [Other:600]   Intake/Output this shift:  Total I/O In: 250 [IV Piggyback:250] Out: -   Physical Exam: General: NAD  Head: Normocephalic, atraumatic. Moist oral mucosal membranes  Eyes: Anicteric  Lungs:  Coarse breath sounds,  normal effort  Heart: regular rhythm, tachycardic   Abdomen:  Soft, nontender, nondistended  Extremities: No peripheral edema.  Bilateral AKA  Neurologic: Arousable  Skin: Warm, dry  Access: Right thigh PermCath    Basic Metabolic Panel: Recent Labs  Lab 06/11/22 0924 06/12/22 1119 06/14/22 0525 06/15/22 0502 06/16/22 0618 06/16/22 1232 06/17/22 0434  NA 132* 136 136 136 138 137 138  K 4.6 4.4 3.1* 3.9 3.7 3.8 3.6  CL 95* 101 100 103 103 102 104  CO2 27 27 26 26 24  21* 26  GLUCOSE 187* 120* 109*  104* 66* 72 103*  BUN 49* 33* 21 27* 16 17 23   CREATININE 4.33* 3.19* 2.20* 3.00* 2.04* 2.25* 2.82*  CALCIUM 8.7* 9.1 8.7* 8.8* 8.6* 8.8* 9.0  MG  --  1.9  --   --   --  1.6* 2.1  PHOS 3.5 2.9  --   --   --   --   --      Liver Function Tests: Recent Labs  Lab 06/11/22 0924  ALBUMIN 2.1*    No results for input(s): "LIPASE", "AMYLASE" in the last 168 hours. No results for input(s): "AMMONIA" in the last 168 hours.  CBC: Recent Labs  Lab 06/12/22 1650 06/14/22 0525 06/15/22 0502 06/16/22 0618 06/17/22 0434  WBC 14.6* 12.7* 12.3* 12.9* 15.2*  NEUTROABS 13.2*  --   --   --   --   HGB 7.1* 9.2* 9.4* 10.3* 10.2*  HCT 22.3* 28.2* 29.3* 32.6* 32.6*  MCV 80.8 80.8 82.1 83.8 86.2  PLT 292 295 335 379 408*     Cardiac Enzymes: No results for input(s): "CKTOTAL", "CKMB", "CKMBINDEX", "TROPONINI" in the last 168 hours.  BNP: Invalid input(s): "POCBNP"  CBG: Recent Labs  Lab 06/12/22 1229 06/12/22 1344  GLUCAP 116* 146*     Microbiology: Results for orders placed or performed during the hospital encounter of 06/02/22  Blood culture (routine x 2)     Status: None   Collection Time: 06/02/22  4:24 PM   Specimen: BLOOD  Result Value Ref Range Status  Specimen Description BLOOD LEFT ANTECUBITAL  Final   Special Requests   Final    BOTTLES DRAWN AEROBIC AND ANAEROBIC Blood Culture results may not be optimal due to an inadequate volume of blood received in culture bottles   Culture   Final    NO GROWTH 5 DAYS Performed at [facility], [address] [place], [place] [address]    Report Status 06/07/2022 FINAL  Final  Blood culture (routine x 2)     Status: None   Collection Time: 06/02/22  8:45 PM   Specimen: BLOOD LEFT HAND  Result Value Ref Range Status   Specimen Description BLOOD LEFT HAND  Final   Special Requests   Final    BOTTLES DRAWN AEROBIC AND ANAEROBIC Blood Culture adequate volume   Culture   Final    NO GROWTH 5 DAYS Performed at  [facility], [address] [place], [place] [address]    Report Status 06/07/2022 FINAL  Final  Culture, blood (Routine X 2) w Reflex to ID Panel     Status: None   Collection Time: 06/12/22 11:20 AM   Specimen: BLOOD  Result Value Ref Range Status   Specimen Description BLOOD BLOOD RIGHT HAND  Final   Special Requests   Final    BOTTLES DRAWN AEROBIC AND ANAEROBIC Blood Culture adequate volume   Culture   Final    NO GROWTH 5 DAYS Performed at [facility], [address] [place], [place] [address]    Report Status 06/17/2022 FINAL  Final  MRSA Next Gen by PCR, Nasal     Status: None   Collection Time: 06/12/22 12:34 PM   Specimen: Nasal Mucosa; Nasal Swab  Result Value Ref Range Status   MRSA by PCR Next Gen NOT DETECTED NOT DETECTED Final    Comment: (NOTE) The GeneXpert MRSA Assay (FDA approved for NASAL specimens only), is one component of a comprehensive MRSA colonization surveillance program. It is not intended to diagnose MRSA infection nor to guide or monitor treatment for MRSA infections. Test performance is not FDA approved in patients less than 3 years old. Performed at [facility], [address] [place], [place] [address]   Culture, blood (Routine X 2) w Reflex to ID Panel     Status: None   Collection Time: 06/12/22 12:36 PM   Specimen: BLOOD RIGHT HAND  Result Value Ref Range Status   Specimen Description BLOOD RIGHT HAND  Final   Special Requests   Final    BOTTLES DRAWN AEROBIC AND ANAEROBIC Blood Culture adequate volume   Culture   Final    NO GROWTH 5 DAYS Performed at [facility], [address] [place],  [address]    Report Status 06/17/2022 FINAL  Final    Coagulation Studies: No results for input(s): "LABPROT", "INR" in the last 72 hours.  Urinalysis: No results for input(s): "COLORURINE", "LABSPEC", "PHURINE", "GLUCOSEU", "HGBUR", "BILIRUBINUR", "KETONESUR", "PROTEINUR", "UROBILINOGEN",  "NITRITE", "LEUKOCYTESUR" in the last 72 hours.  Invalid input(s): "APPERANCEUR"    Imaging: No results found.   Medications:    levETIRAcetam Stopped (06/17/22 0045)   levETIRAcetam Stopped (06/15/22 2218)   piperacillin-tazobactam (ZOSYN)  IV Stopped (06/17/22 0545)    (feeding supplement) PROSource Plus  30 mL Oral TID BM   apixaban  2.5 mg Oral BID   vitamin C  500 mg Oral BID   atorvastatin  40 mg Oral Daily   Chlorhexidine Gluconate Cloth  6 each Topical Daily  Chlorhexidine Gluconate Cloth  6 each Topical Once   DULoxetine  40 mg Oral BID   epoetin (EPOGEN/PROCRIT) injection  4,000 Units Intravenous Q M,W,F-HD   feeding supplement (NEPRO CARB STEADY)  237 mL Oral TID BM   midodrine  5 mg Oral TID WC   multivitamin  1 tablet Oral QHS   mouth rinse  15 mL Mouth Rinse 4 times per day   pantoprazole (PROTONIX) IV  40 mg Intravenous Q24H   polyethylene glycol  17 g Oral Daily   senna-docusate  1 tablet Oral QHS   sevelamer carbonate  800 mg Oral TID WC   zinc sulfate  220 mg Oral Daily   haloperidol lactate, HYDROmorphone (DILAUDID) injection, ondansetron **OR** ondansetron (ZOFRAN) IV, ondansetron (ZOFRAN) IV, mouth rinse, oxyCODONE-acetaminophen  Assessment/ Plan:  Ernest [name] is a 76 y.o.  male  with past medical conditions including CAD, COPD, CHF, diabetes, stroke with late seizures, hypertension, and end-stage renal disease on hemodialysis.  Patient presents to the emergency department from [facility] with concerns of left foot wound and has been admitted for 06/02/2022  3:25 PM   [facility]/MWF/right thigh PermCath  End-stage renal disease on hemodialysis.   No acute indication for HD today. Will plan for HD tomorrow. Orders prepared   2. Anemia of chronic kidney disease Lab Results  Component Value Date   HGB 10.2 (L) 06/17/2022    Hgb at goal , 10.2 , continue epogen.   3. Secondary Hyperparathyroidism: Lab Results  Component  Value Date   PTH 63 10/05/2017   PTH Comment 10/05/2017   CALCIUM 9.0 06/17/2022   CAION 1.05 (L) 07/12/2020   PHOS 2.9 06/12/2022   Continue sevelamer with meals.   4.  Hypertension with chronic kidney disease.  Home regimen includes amlodipine. Now on midodrine only.    5.  Dry gangrene,  on left foot.  Patient was admitted with dry gangrene.  Patient is now s/p AKA Vascular surgery and primary team are following     LOS: [place] 9/3/202311:12 AM

## 2022-06-17 NOTE — Progress Notes (Signed)
BP on admit to [facility] 107/29 with MAP 51. George Hugh, MD; [place], Charge RN; and Joellen Jersey, ICU Charge RN made aware. See new order regarding midodrine.

## 2022-06-17 NOTE — Progress Notes (Signed)
Pt arrived to room 215 via bed from ICU. Received report from [place], [place]. Left femoral arterial line removed in ICU per Magda Paganini, RN prior to arrival to [facility]. See assessment. Will continue to monitor.

## 2022-06-17 NOTE — Progress Notes (Addendum)
Progress Note   Patient: Ernest [name] BBC:[id number] DOB: 06-06-1946 DOA: 06/02/2022     15 DOS: the patient was seen and examined on 06/17/2022   Brief hospital course: 77 year old male from [facility] with PMH of deafness, HTN, HL, DM2, COPD, CHF with EF 20-25%, CAD s/p CABG, Peripheral Arterial Disease s/p right AKA, Atrial Flutter (on Eliquis), History of CVA and Vascular Dementia, Seizure Disorder (on Keppra), ESRD on HD MWF who presented to the ED on 8/19 with gangrene of the left foot.  8/25: Left AKA (performed by Dr. [name]) 8/29: Patient became hypotensive and started on pressors.  Central Line and Arterial Line were placed.   8/30: Patient weaned off pressors. 8/31: Comfort Care discussions with family began. 9/3: Arterial line removed. Transferred out of ICU.  Code Status is DNR/DNI.  Assessment and Plan:   Hypotension: Given patient's poor cardiac output, irregular pulse, and peripheral vascular resistance, his MAP will be read as low by a pressure cuff on his distal extremities.  His arterial line showed normal trace and normal pulse pressure variation and MAP ~ 90 mmHg with a BP ~ 130/70 mmHg while on Midodrine 5 mg TID. - Therefore, our goal SBP via a manual cuff will be > 90 mmHG to assure adequate tissue perfusion, and BP should be checked in multiple extremities to ensure accurate readings.  We will also monitor patient clinically and labs including Lactic Acid to ensure organ perfusion.  If there are any acute changes, we will transfer patient back to ICU for pressors and revisit goals of care conversation with the family.   - Increase Midodrine from 5 to 10 mg TID to optimize blood pressure readings. - This is Day 6 of antibiotics.  Continue antibiotics for now as patient is not clinically improving.    Goals of Care / Pain Management: Patient has reached the end of life process. - I discussed goals of care at length with the daughter Ms. Walker over the phone.   She voiced understanding of his disease trajectory and states she is waiting for the rest of her family to arrive to the hospital before she makes a decision regarding comfort care. - I requested that we start scheduling patient IV Dilaudid 0.5 mg q4hrs and give Ativan as needed.  [name] understands the risks of these medications and states she will need time to think about it.  She will make a decision tomorrow after visiting Mr. Maddocks.  Severe Protein Calorie Malnutrition: Patient has poor oral intake which is another sign of the end of life. - Continue supplements and renal vitamins if patient is agreeable.  Acute on Chronic Systolic Heart Failure with EF 20-25%: - Cardiology does not recommend any ischemic evaluation, appreciate. - Hold off on any GDMT due to low BP.  Peripheral Arterial Disease s/p Bilateral AKA - Continue Eliquis.  Monitor HH.  Acute Hypoxic Respiratory Failure in the setting of Chronic COPD: - Patient is requiring 2L [place].  ESRD on HD MWF: - Nephrology is following, appreciate.   Vascular dementia ([place]) - Continue Haldol as needed for agitation:  Stage 2 Sacral Ulcer, POA, stable: - Continue local wound care.  Goals of Care: - Palliative Care is following the patient.   - Continue goals of care discussion daily with the family.  Code Status: DNR/DNI   Anemia of chronic kidney disease: - Monitor HH.   Atrial flutter ([place]) - Continue Eliquis for anticoagulation. - Monitor heart rate on  telemetry.   Seizure, late effect of stroke ([place]) On IV Keppra.       Subjective: Minimally responsive.   Physical Exam: Vitals:   06/17/22 0810 06/17/22 1000 06/17/22 1200 06/17/22 1300  BP: (!) 123/51 131/62    Pulse: 99 (!) 102 (!) 103 (!) 106  Resp: (!) 24 16 16 19   Temp: 97.6 F (36.4 C)     TempSrc: Oral     SpO2: 99% 97% 96% 96%  Weight:      Height:       General exam: critically ill appearing, eyes glazed towards ceiling, drowsy,  occasionally complains of back pain Respiratory system: Breath sounds are diminished bilaterally Cardiovascular system: Regular and tachycardic.  Did not appreciate a murmur Gastrointestinal system: soft NT, ND Central nervous system: generalized weakness, no focal deficits Extremities: bilateral BKA, c/d/i Psychiatry: appears tired Access - right femoral permcath Arterial line removed.  Data Reviewed:      Latest Ref Rng & Units 06/17/2022    4:34 AM 06/16/2022    6:18 AM 06/15/2022    5:02 AM  CBC  WBC 4.0 - 10.5 K/uL 15.2  12.9  12.3   Hemoglobin 13.0 - 17.0 g/dL 10.2  10.3  9.4   Hematocrit 39.0 - 52.0 % 32.6  32.6  29.3   Platelets 150 - 400 K/uL 408  379  335       Latest Ref Rng & Units 06/17/2022    4:34 AM 06/16/2022   12:32 PM 06/16/2022    6:18 AM  BMP  Glucose 70 - 99 mg/dL 103  72  66   BUN 8 - 23 mg/dL 23  17  16    Creatinine 0.61 - 1.24 mg/dL 2.82  2.25  2.04   Sodium 135 - 145 mmol/L 138  137  138   Potassium 3.5 - 5.1 mmol/L 3.6  3.8  3.7   Chloride 98 - 111 mmol/L 104  102  103   CO2 22 - 32 mmol/L 26  21  24    Calcium 8.9 - 10.3 mg/dL 9.0  8.8  8.6    Mg: 1.6  Disposition: Status is: Inpatient Remains inpatient appropriate because: severity of illness  Planned Discharge Destination:  TBD    Time spent: 55 minutes  Author: George Hugh, MD 06/17/2022 3:31 PM  For on call review [email or web address].

## 2022-06-18 DIAGNOSIS — Z7189 Other specified counseling: Secondary | ICD-10-CM

## 2022-06-18 DIAGNOSIS — I214 Non-ST elevation (NSTEMI) myocardial infarction: Secondary | ICD-10-CM

## 2022-06-18 DIAGNOSIS — I5023 Acute on chronic systolic (congestive) heart failure: Secondary | ICD-10-CM | POA: Diagnosis not present

## 2022-06-18 DIAGNOSIS — R001 Bradycardia, unspecified: Secondary | ICD-10-CM

## 2022-06-18 DIAGNOSIS — F4329 Adjustment disorder with other symptoms: Secondary | ICD-10-CM | POA: Diagnosis not present

## 2022-06-18 DIAGNOSIS — I4892 Unspecified atrial flutter: Secondary | ICD-10-CM | POA: Diagnosis not present

## 2022-06-18 LAB — BASIC METABOLIC PANEL
Anion gap: 12 (ref 5–15)
BUN: 28 mg/dL — ABNORMAL HIGH (ref 8–23)
CO2: 24 mmol/L (ref 22–32)
Calcium: 8.8 mg/dL — ABNORMAL LOW (ref 8.9–10.3)
Chloride: 101 mmol/L (ref 98–111)
Creatinine, Ser: 3.42 mg/dL — ABNORMAL HIGH (ref 0.61–1.24)
GFR, Estimated: 18 mL/min — ABNORMAL LOW (ref >60)
Glucose, Bld: 97 mg/dL (ref 70–99)
Potassium: 3.3 mmol/L — ABNORMAL LOW (ref 3.5–5.1)
Sodium: 137 mmol/L (ref 135–145)

## 2022-06-18 LAB — CBC WITH DIFFERENTIAL/PLATELET
Abs Immature Granulocytes: 0.07 10*3/uL (ref 0.00–0.07)
Basophils Absolute: 0.1 10*3/uL (ref 0.0–0.1)
Basophils Relative: 0 %
Eosinophils Absolute: 0.2 10*3/uL (ref 0.0–0.5)
Eosinophils Relative: 1 %
HCT: 33 % — ABNORMAL LOW (ref 39.0–52.0)
Hemoglobin: 10.7 g/dL — ABNORMAL LOW (ref 13.0–17.0)
Immature Granulocytes: 1 %
Lymphocytes Relative: 7 %
Lymphs Abs: 1 10*3/uL (ref 0.7–4.0)
MCH: 27.3 pg (ref 26.0–34.0)
MCHC: 32.4 g/dL (ref 30.0–36.0)
MCV: 84.2 fL (ref 80.0–100.0)
Monocytes Absolute: 1.1 10*3/uL — ABNORMAL HIGH (ref 0.1–1.0)
Monocytes Relative: 8 %
Neutro Abs: 11.1 10*3/uL — ABNORMAL HIGH (ref 1.7–7.7)
Neutrophils Relative %: 83 %
Platelets: 425 10*3/uL — ABNORMAL HIGH (ref 150–400)
RBC: 3.92 MIL/uL — ABNORMAL LOW (ref 4.22–5.81)
RDW: 21.9 % — ABNORMAL HIGH (ref 11.5–15.5)
Smear Review: NORMAL
WBC: 13.4 10*3/uL — ABNORMAL HIGH (ref 4.0–10.5)
nRBC: 0 % (ref 0.0–0.2)

## 2022-06-18 LAB — GLUCOSE, CAPILLARY: Glucose-Capillary: 71 mg/dL (ref 70–99)

## 2022-06-18 MED ORDER — POTASSIUM CHLORIDE 10 MEQ/100ML IV SOLN
10.0000 meq | INTRAVENOUS | Status: AC
  Administered 2022-06-18 (×4): 10 meq via INTRAVENOUS
  Filled 2022-06-18 (×3): qty 100

## 2022-06-18 NOTE — Progress Notes (Signed)
[facility]  ROUNDING NOTE   Subjective:   Ernest [name] is a 76 year old African-American male with past medical conditions including CAD, COPD, CHF, diabetes, stroke with late seizures, hypertension, and end-stage renal disease on hemodialysis.  Patient presents to the emergency department from [facility] with concerns of left foot wound and has been admitted for Dry gangrene [facility]) [I96]  Patient is known to our practice from previous admissions and receives outpatient dialysis treatments at [facility] on a MWF schedule, supervised by [facility] physicians.    Update: Patient seen and evaluated at bedside. Due for hemodialysis treatment today. Has been transitioned to floor care.  Objective:  Vital signs in last 24 hours:  Temp:  [97.5 F (36.4 C)-98.6 F (37 C)] 97.7 F (36.5 C) (09/04 0839) Pulse Rate:  [99-106] 104 (09/04 0842) Resp:  [16-19] 18 (09/04 0842) BP: (72-138)/(29-88) 94/30 (09/04 0842) SpO2:  [96 %-100 %] 100 % (09/04 0839) Arterial Line BP: (133)/(60) 133/60 (09/03 1300) Weight:  [57.7 kg] 57.7 kg (09/03 1556)  Weight change:  Filed Weights   06/06/22 0809 06/06/22 1230 06/17/22 1556  Weight: 56.9 kg 55.9 kg 57.7 kg    Intake/Output: I/O last 3 completed shifts: In: 350 [IV Piggyback:350] Out: -    Intake/Output this shift:  No intake/output data recorded.  Physical Exam: General: NAD  Head: Normocephalic, atraumatic. Moist oral mucosal membranes  Eyes: Anicteric  Lungs:  Coarse breath sounds,  normal effort  Heart: regular rhythm, tachycardic   Abdomen:  Soft, nontender, nondistended  Extremities: No peripheral edema.  Bilateral AKA  Neurologic: Arousable  Skin: Warm, dry  Access: Right thigh PermCath    Basic Metabolic Panel: Recent Labs  Lab 06/12/22 1119 06/14/22 0525 06/16/22 0618 06/16/22 1232 06/17/22 0434 06/17/22 1815 06/18/22 0438  NA 136   < > 138 137 138 135 137  K 4.4   < > 3.7 3.8 3.6 3.4* 3.3*   CL 101   < > 103 102 104 96* 101  CO2 27   < > 24 21* 26 27 24   GLUCOSE 120*   < > 66* 72 103* 124* 97  BUN 33*   < > 16 17 23  26* 28*  CREATININE 3.19*   < > 2.04* 2.25* 2.82* 3.07* 3.42*  CALCIUM 9.1   < > 8.6* 8.8* 9.0 8.5* 8.8*  MG 1.9  --   --  1.6* 2.1  --   --   PHOS 2.9  --   --   --   --   --   --    < > = values in this interval not displayed.     Liver Function Tests: Recent Labs  Lab 06/17/22 1815  AST 16  ALT 10  ALKPHOS 85  BILITOT 1.1  PROT 5.4*  ALBUMIN 2.1*    No results for input(s): "LIPASE", "AMYLASE" in the last 168 hours. No results for input(s): "AMMONIA" in the last 168 hours.  CBC: Recent Labs  Lab 06/12/22 1650 06/14/22 0525 06/15/22 0502 06/16/22 0618 06/17/22 0434 06/17/22 1815 06/18/22 0438  WBC 14.6*   < > 12.3* 12.9* 15.2* 14.6* 13.4*  NEUTROABS 13.2*  --   --   --   --   --  11.1*  HGB 7.1*   < > 9.4* 10.3* 10.2* 10.3* 10.7*  HCT 22.3*   < > 29.3* 32.6* 32.6* 32.9* 33.0*  MCV 80.8   < > 82.1 83.8 86.2 85.5 84.2  PLT 292   < >  335 379 408* 426* 425*   < > = values in this interval not displayed.     Cardiac Enzymes: Recent Labs  Lab 06/17/22 1815  CKTOTAL 17*    BNP: Invalid input(s): "POCBNP"  CBG: Recent Labs  Lab 06/12/22 1229 06/12/22 1344  GLUCAP 116* 146*     Microbiology: Results for orders placed or performed during the hospital encounter of 06/02/22  Blood culture (routine x 2)     Status: None   Collection Time: 06/02/22  4:24 PM   Specimen: BLOOD  Result Value Ref Range Status   Specimen Description BLOOD LEFT ANTECUBITAL  Final   Special Requests   Final    BOTTLES DRAWN AEROBIC AND ANAEROBIC Blood Culture results may not be optimal due to an inadequate volume of blood received in culture bottles   Culture   Final    NO GROWTH 5 DAYS Performed at [facility], [address] [place], [place] [address]    Report Status 06/07/2022 FINAL  Final  Blood culture (routine x 2)     Status:  None   Collection Time: 06/02/22  8:45 PM   Specimen: BLOOD LEFT HAND  Result Value Ref Range Status   Specimen Description BLOOD LEFT HAND  Final   Special Requests   Final    BOTTLES DRAWN AEROBIC AND ANAEROBIC Blood Culture adequate volume   Culture   Final    NO GROWTH 5 DAYS Performed at [facility], [address] [place], [place] [address]    Report Status 06/07/2022 FINAL  Final  Culture, blood (Routine X 2) w Reflex to ID Panel     Status: None   Collection Time: 06/12/22 11:20 AM   Specimen: BLOOD  Result Value Ref Range Status   Specimen Description BLOOD BLOOD RIGHT HAND  Final   Special Requests   Final    BOTTLES DRAWN AEROBIC AND ANAEROBIC Blood Culture adequate volume   Culture   Final    NO GROWTH 5 DAYS Performed at [facility], [address], [place], [place] [address]    Report Status 06/17/2022 FINAL  Final  MRSA Next Gen by PCR, Nasal     Status: None   Collection Time: 06/12/22 12:34 PM   Specimen: Nasal Mucosa; Nasal Swab  Result Value Ref Range Status   MRSA by PCR Next Gen NOT DETECTED NOT DETECTED Final    Comment: (NOTE) The GeneXpert MRSA Assay (FDA approved for NASAL specimens only), is one component of a comprehensive MRSA colonization surveillance program. It is not intended to diagnose MRSA infection nor to guide or monitor treatment for MRSA infections. Test performance is not FDA approved in patients less than 61 years old. Performed at [facility], [address] [place], [place] [address]   Culture, blood (Routine X 2) w Reflex to ID Panel     Status: None   Collection Time: 06/12/22 12:36 PM   Specimen: BLOOD RIGHT HAND  Result Value Ref Range Status   Specimen Description BLOOD RIGHT HAND  Final   Special Requests   Final    BOTTLES DRAWN AEROBIC AND ANAEROBIC Blood Culture adequate volume   Culture   Final    NO GROWTH 5 DAYS Performed at [facility], [address] [place],  [place] [address]    Report Status 06/17/2022 FINAL  Final    Coagulation Studies: No results for input(s): "LABPROT", "INR" in the last 72 hours.  Urinalysis: No results for  input(s): "COLORURINE", "LABSPEC", "PHURINE", "GLUCOSEU", "HGBUR", "BILIRUBINUR", "KETONESUR", "PROTEINUR", "UROBILINOGEN", "NITRITE", "LEUKOCYTESUR" in the last 72 hours.  Invalid input(s): "APPERANCEUR"    Imaging: No results found.   Medications:    levETIRAcetam Stopped (06/17/22 2140)   levETIRAcetam Stopped (06/15/22 2218)   piperacillin-tazobactam (ZOSYN)  IV Stopped (06/18/22 0645)   potassium chloride      (feeding supplement) PROSource Plus  30 mL Oral TID BM   apixaban  2.5 mg Oral BID   vitamin C  500 mg Oral BID   atorvastatin  40 mg Oral Daily   Chlorhexidine Gluconate Cloth  6 each Topical Daily   Chlorhexidine Gluconate Cloth  6 each Topical Once   DULoxetine  40 mg Oral BID   epoetin (EPOGEN/PROCRIT) injection  4,000 Units Intravenous Q M,W,F-HD   feeding supplement (NEPRO CARB STEADY)  237 mL Oral TID BM   midodrine  10 mg Oral TID WC   multivitamin  1 tablet Oral QHS   mouth rinse  15 mL Mouth Rinse 4 times per day   pantoprazole (PROTONIX) IV  40 mg Intravenous Q24H   polyethylene glycol  17 g Oral Daily   senna-docusate  1 tablet Oral QHS   sevelamer carbonate  800 mg Oral TID WC   haloperidol lactate, HYDROmorphone (DILAUDID) injection, ondansetron **OR** ondansetron (ZOFRAN) IV, ondansetron (ZOFRAN) IV, mouth rinse, oxyCODONE-acetaminophen  Assessment/ Plan:  [name] is a 76 y.o.  male  with past medical conditions including CAD, COPD, CHF, diabetes, stroke with late seizures, hypertension, and end-stage renal disease on hemodialysis.  Patient presents to the emergency department from [facility] with concerns of left foot wound and has been admitted for 06/02/2022  3:25 PM   [facility]/MWF/right thigh PermCath  End-stage renal disease on hemodialysis.    Patient due for hemodialysis treatment today.  Orders have been prepared.  2. Anemia of chronic kidney disease Lab Results  Component Value Date   HGB 10.7 (L) 06/18/2022    Maintain the patient on current dosage of Epogen as hemoglobin at target at 10.7.  3. Secondary Hyperparathyroidism: Lab Results  Component Value Date   PTH 63 10/05/2017   PTH Comment 10/05/2017   CALCIUM 8.8 (L) 06/18/2022   CAION 1.05 (L) 07/12/2020   PHOS 2.9 06/12/2022   Continue sevelamer with meals.   4.  Hypertension with chronic kidney disease.  Home regimen includes amlodipine. Now on midodrine only.    5.  Dry gangrene,  on left foot.  Patient was admitted with dry gangrene.  Patient is now s/p AKA Vascular surgery and primary team are following     LOS: 16 Chesni Vos 9/4/202312:54 PM

## 2022-06-18 NOTE — Progress Notes (Signed)
1600 Transferred to Stepdown per hospitalist for low B/P. 1800 cleaned of stool. Remains in STac. 1800 B/P 116/70.

## 2022-06-18 NOTE — Progress Notes (Addendum)
Daily Progress Note   Patient Name: Ernest [name]       Date: 06/18/2022 DOB: 11/19/1945  Age: 76 y.o. MRN#: [id number] Attending Physician: Shelly Coss, MD Primary Care Physician: Brayton Mars, MD Admit Date: 06/02/2022  Reason for Consultation/Follow-up: Establishing goals of care  Subjective: Notes and labs and diagnostics reviewed. In to see patient, no family at bedside. He opened eyes briefly to voice and touch, and closed them. Per notes, GOC have been discussed with daughter and daughter is waiting for other family members to arrive before making any decisions, and will make decisions soon. I attempted to call daughter to answer questions and provide support, but was unable to reach her successfully.     Received a call back from daughter.  She discusses that her mother and father moved from [place] to [place] to have a change of scenery and pace.  Daughter advises that her father has been living in a nursing facility for the past year and a half since his wife, her mother died.  She states this has been very difficult for him.  She states she has been trying to get him relocated to where she lives which is 5 hours away, but thinks keep happening to his health that get in the way of this plan.  She has been updated by medical teams and is aware of his poor prognosis.  She states that she does not want hospice for her father's that would mean stopping dialysis.  Discussed his multiple diagnoses both chronic and acute, including his hypotension.  Discussed how they impact each other.  Discussed quality of life.  With further conversation she states that he does not want to do dialysis as it causes him pain.  We discussed patient autonomy and decision making.  She discusses the  importance of her family members who live in [place].  We discussed a scenario of trying to force care that a person does not want, and began discussing what cessation of dialysis would look like.  She then stated that she was unable to talk any longer and plans were made to meet tomorrow morning around 10 AM at bedside.  Length of Stay: 16  Current Medications: Scheduled Meds:  . (feeding supplement) PROSource Plus  30 mL Oral TID BM  . apixaban  2.5 mg Oral BID  . vitamin C  500 mg Oral BID  . atorvastatin  40 mg Oral Daily  . Chlorhexidine Gluconate Cloth  6 each Topical Daily  . Chlorhexidine Gluconate Cloth  6 each Topical Once  . DULoxetine  40 mg Oral BID  . epoetin (EPOGEN/PROCRIT) injection  4,000 Units Intravenous Q M,W,F-HD  . feeding supplement (NEPRO CARB STEADY)  237 mL Oral TID BM  . midodrine  10 mg Oral TID WC  . multivitamin  1 tablet Oral QHS  . mouth rinse  15 mL Mouth Rinse 4 times per day  . pantoprazole (PROTONIX) IV  40 mg Intravenous Q24H  . polyethylene glycol  17 g Oral Daily  . senna-docusate  1 tablet Oral QHS  . sevelamer carbonate  800 mg Oral TID WC    Continuous Infusions: . levETIRAcetam Stopped (06/17/22 2140)  . levETIRAcetam Stopped (06/15/22 2218)  . piperacillin-tazobactam (ZOSYN)  IV Stopped (06/18/22 0645)    PRN Meds: haloperidol lactate, HYDROmorphone (DILAUDID) injection, ondansetron **OR** ondansetron (ZOFRAN) IV, ondansetron (ZOFRAN) IV, mouth rinse, oxyCODONE-acetaminophen  Physical Exam Musculoskeletal:     Comments: Bilateral lower extremity amputations.   Skin:    General: Skin is warm and dry.             Vital Signs: BP (!) 94/30 (BP Location: Right Wrist)   Pulse (!) 104   Temp 97.7 F (36.5 C) (Oral)   Resp 18   Ht 5\' 9"  (1.753 m)   Wt 57.7 kg   SpO2 100%   BMI 18.78 kg/m  SpO2: SpO2: 100 % O2 Device: O2 Device: Nasal Cannula O2 Flow Rate: O2 Flow Rate (L/min): 2.5 L/min  Intake/output summary:   Intake/Output Summary (Last 24 hours) at 06/18/2022 1046 Last data filed at 06/18/2022 0305 Gross per 24 hour  Intake 100 ml  Output --  Net 100 ml   LBM: Last BM Date : 06/17/22 Baseline Weight: Weight: 66 kg Most recent weight: Weight: 57.7 kg         Patient Active Problem List   Diagnosis Date Noted  . Protein-calorie malnutrition, severe 06/05/2022  . Adjustment reaction with aggression 06/05/2022  . Impaired fasting glucose 06/03/2022  . Hypokalemia 06/03/2022  . Dry gangrene ([place]) 06/02/2022  . Chronic respiratory failure with hypoxia ([place]) 10/02/2021  . Slurred speech 10/02/2021  . Osteomyelitis of right foot ([place])   . Acquired absence of right leg above knee ([place]) 07/21/2021  . Pressure injury of skin 07/19/2021  . Pressure ulcer 07/16/2021  . Septic shock ([place])   . Wet gangrene ([place]) 07/10/2021  . S/P AKA (above knee amputation), right ([place]) 07/10/2021  . Atherosclerosis of native arteries of the extremities with gangrene ([place]) 07/08/2021  . Sepsis ([place]) 06/27/2021  . Bacterial infection, unspecified 03/31/2021  . Contact with and (suspected) exposure to covid-19 03/13/2021  . COVID-19 03/07/2021  . Disorder of phosphorus metabolism, unspecified 02/20/2021  . Hypercalcemia 12/08/2020  . Coronary artery disease involving native coronary artery of native heart without angina pectoris 11/18/2020  . Diarrhea, unspecified 11/18/2020  . Fever, unspecified 11/18/2020  . Other fluid overload 11/18/2020  . Pain, unspecified 11/18/2020  . Pleural effusion on left 09/30/2019  . Acute respiratory failure with hypoxia ([place]) 09/30/2019  . Pleural effusion 09/30/2019  . Chest pain 12/02/2018  . Vascular dementia ([place]) 10/10/2017  . Atrial flutter ([place]) 10/10/2017  . Altered mental status, unspecified 10/09/2017  . Other malaise 10/09/2017  . S/P CABG x 4 10/04/2017  .  Cerebral embolism with cerebral infarction 09/20/2017  . NSVT (nonsustained ventricular tachycardia)  ([place])   . Elevated troponin   . Dyspnea 09/08/2017  . Non-ST elevation MI (NSTEMI) ([place])   . Acute on chronic combined systolic and diastolic CHF (congestive heart failure) ([place])   . End-stage renal disease on hemodialysis ([place])   . Left leg weakness   . TIA (transient ischemic attack) 10/27/2016  . Essential hypertension 09/18/2015  . Dependence on renal dialysis ([place]) 08/09/2015  . History of CVA (cerebrovascular accident) 08/07/2015  . Seizure, late effect of stroke ([place]) 08/07/2015  . Diabetic peripheral neuropathy ([place]) 08/07/2015  . Unspecified protein-calorie malnutrition ([place]) 05/27/2015  . Seizure ([place]) 04/29/2015  . Coagulation defect, unspecified ([place]) 07/16/2014  . Type 2 diabetes mellitus with diabetic peripheral angiopathy without gangrene ([place]) 07/01/2014  . Pruritus, unspecified 05/26/2014  . Elevated prostate specific antigen (PSA) 08/03/2013  . Encounter for immunization 07/10/2013  . Obstructive sleep apnea 06/16/2013  . Hyperlipidemia associated with type 2 diabetes mellitus ([place])   . ESRD (end stage renal disease) ([place])   . Erectile dysfunction associated with type 2 diabetes mellitus ([place])   . Hypertension due to end stage renal disease caused by type 2 diabetes mellitus, on dialysis ([place])   . COPD (chronic obstructive pulmonary disease) ([place])   . Chronic HFrEF (heart failure with reduced ejection fraction) ([place])   . Anemia of chronic disease 12/27/2011  . Long term (current) use of insulin ([place]) 11/03/2009  . Gout, unspecified 02/28/2009  . Iron deficiency anemia, unspecified 10/30/2008  . Secondary hyperparathyroidism of renal origin [facility]) 10/30/2008    Palliative Care Assessment & Plan    Recommendations/Plan:  Per notes daughter will discuss care with other family and will make decisions moving forward. I attempted unsuccessfully to reach daughter.   Addendum: Plans to meet around 10 AM tomorrow morning      Code Status:    Code Status Orders   (From admission, onward)           Start     Ordered   06/05/22 1315  Do not attempt resuscitation (DNR)  Continuous       Question Answer Comment  In the event of cardiac or respiratory ARREST Do not call a "code blue"   In the event of cardiac or respiratory ARREST Do not perform Intubation, CPR, defibrillation or ACLS   In the event of cardiac or respiratory ARREST Use medication by any route, position, wound care, and other measures to relive pain and suffering. May use oxygen, suction and manual treatment of airway obstruction as needed for comfort.      06/05/22 1314           Code Status History     Date Active Date Inactive Code Status Order ID Comments User Context   06/02/2022 2043 06/05/2022 1314 Full Code [id number]  Sid Falcon, MD Inpatient   11/23/2021 1044 11/23/2021 1756 Full Code [id number]  Algernon Huxley, MD Inpatient   09/26/2021 1347 09/29/2021 2136 DNR [id number]  Nolberto Hanlon, MD Inpatient   09/26/2021 1342 09/26/2021 1347 Full Code [id number]  Nolberto Hanlon, MD Inpatient   09/25/2021 1606 09/26/2021 1342 DNR [id number]  Sidney Ace, Arvella Merles, MD ED   07/13/2021 1456 07/21/2021 0009 DNR [id number]  Asencion Gowda, NP Inpatient   07/08/2021 1625 07/13/2021 1455 Full Code [id number]  Elmore Guise, MD Inpatient   06/27/2021 0250 07/08/2021 1456 Full Code [id number]  Mansy, Arvella Merles, MD ED   09/30/2019 1657 09/30/2019  2058 Full Code [id number]  Ina Homes, MD ED   09/08/2017 0359 10/07/2017 2150 Full Code [id number]  Collier Salina, MD ED   10/27/2016 2044 10/29/2016 2121 Full Code [id number]  Zada Finders, MD Inpatient   07/13/2015 1652 07/21/2015 1336 Full Code [id number]  Cathlyn Parsons, [name] Inpatient   07/11/2015 1629 07/13/2015 1652 Full Code [id number]  Juluis Mire, MD ED   04/29/2015 1352 04/30/2015 2038 Full Code [id number]  Lucious Groves, DO Inpatient   08/27/2013 0427 08/27/2013 1845 Full Code [id number]  Mal Misty, MD Inpatient       Prognosis:  Poor  overall    Thank you for allowing the Palliative Medicine Team to assist in the care of this patient.   Asencion Gowda, NP  Please contact Palliative Medicine Team phone at [phone] for questions and concerns.

## 2022-06-18 NOTE — Progress Notes (Signed)
   06/18/22 1344  Assess: MEWS Score  Temp 98.1 F (36.7 C)  BP (!) 68/50  MAP (mmHg) (!) 56  Pulse Rate 100  Resp 16  Level of Consciousness Responds to Voice  SpO2 100 %  O2 Device Nasal Cannula  O2 Flow Rate (L/min) 2.5 L/min  Assess: MEWS Score  MEWS Temp 0  MEWS Systolic 3  MEWS Pulse 0  MEWS RR 0  MEWS LOC 1  MEWS Score 4  MEWS Score Color Red  Escalate  MEWS: Escalate Red: discuss with charge nurse/RN and provider, consider discussing with RRT  Notify: Charge Nurse/RN  Name of Charge Nurse/RN Notified Hessie Dibble RN  Date Charge Nurse/RN Notified 06/18/22  Time Charge Nurse/RN Notified 4196  Notify: Provider  Provider Name/Title Adhikari  Notify: Rapid Response  Name of Rapid Response RN Notified Adalberto Ill  Date Rapid Response Notified 06/18/22  Time Rapid Response Notified 2229  Document  Patient Outcome Transferred/level of care increased  Assess: SIRS CRITERIA  SIRS Temperature  0  SIRS Pulse 1  SIRS Respirations  0  SIRS WBC 1  SIRS Score Sum  2

## 2022-06-18 NOTE — Progress Notes (Signed)
Pt unable to receive HD treatment in K[facility] d/t sbp<60. MD present. Driscilla Moats MD notified. Pt to return to room on floor and HD staff will await further orders from MD. Pt responds to voice. Safety maintained.

## 2022-06-18 NOTE — Progress Notes (Signed)
BP 127/76 on right upper arm. Per Dr. Patsey Berthold, okay to take BP on right upper arm.

## 2022-06-18 NOTE — Progress Notes (Addendum)
PROGRESS NOTE  Ernest [name]  ZOX:[id number] DOB: Aug 06, 1946 DOA: 06/02/2022 PCP: Brayton Mars, MD   Brief Narrative: Patient is a 76 year old male with history of deafness, hypertension, hyperlipidemia, diabetes type 2, COPD, CHF with EF of 20 to 25%, coronary disease status post CABG, peripheral artery disease status post right AKA, atrial flutter on Eliquis, history of CVA, dementia, seizure disorder, ESRD on dialysis who presented here from [place] on 8/19 for the evaluation of left foot wound.  Vascular surgery was consulted for admission and he underwent left AKA.  On 8/29.  Patient became hypotensive and started on pressures patient was weaned off on 8/30.  Hospitalist remarkable for persistent hypotension, encephalopathy.  Palliative care was consulted for goals of care.  Patient moved out of ICU on 9/3.  Due to poor prognosis, trying to reach out to family about comfort care discussion. PCCM team reached out  today about the need of possible vasopressors  Assessment & Plan:  Principal Problem:   Adjustment reaction with aggression Active Problems:   Dry gangrene ([place])   End-stage renal disease on hemodialysis ([place])   Vascular dementia ([place])   Pressure injury of skin   Secondary hyperparathyroidism of renal origin ([place])   Chronic HFrEF (heart failure with reduced ejection fraction) ([place])   History of CVA (cerebrovascular accident)   Seizure, late effect of stroke ([place])   Essential hypertension   Elevated troponin   Atrial flutter (HCC)   Anemia of chronic disease   Type 2 diabetes mellitus with diabetic peripheral angiopathy without gangrene ([place])   Atherosclerosis of native arteries of the extremities with gangrene ([place])   Septic shock (HCC)   Acquired absence of right leg above knee (HCC)   Impaired fasting glucose   Hypokalemia   Protein-calorie malnutrition, severe   Persistent hypotension: Patient has low cardiac output due to severe  congestive heart failure.   Currently on midodrine.  Blood pressure still very soft.  Also suspicion of sepsis, on antibiotics. We have sent message to PCCM team regarding the possible need of vasopressure.Communicated with Dr Patsey Berthold  Acute on chronic systolic congestive heart failure: EF of 20 to 25%.  Cards following, no plan for further ischemic work-up.  Avoid antihypertensives  NSTEMI: No chest pain or dyspnea.  Troponin peaked at 2114.  No plan for further ischemic work-up.  On aspirin, Eliquis, Lipitor  Peripheral artery disease: Status post bilateral AKA.  On Eliquis  Acute hypoxic respiratory failure/COPD: Currently on 2 L of oxygen per minute.  ESRD on dialysis: Dialyzed on Monday, Wednesday, Friday, nephrology following.  Might be unable for dialysis today because of low blood pressure  Vascular dementia: Continue delirium precautions.  Haldol for severe agitation.  Frequent reorientation.  Stage II sacral ulcer: Continue local wound care  Anemia of chronic disease: Secondary to ESRD.  Continue to monitor hemoglobin.  Seizure disorder: On Keppra  Atrial flutter: currently rate is controlled.  On Eliquis for anticoagulation.  Not a candidate for beta-blocker due to soft blood pressure.  Severe protein calorie malnutrition: Continue supportive care, nutritionist following  Dry gangrene of left foot: Now status post AKA.  Vascular surgery was following.  Goals of care: Multiple comorbidities.  Very poor prognosis.  Currently declining, hypotensive, encephalopathic.  Palliative care following for goals of care.  Trying to reach out to family.  CODE STATUS DNR. As per palliative care note,daughter planning to discuss with all the family members regarding decision on comfort care.   Nutrition  Problem: Severe Malnutrition Etiology: chronic illness (COPD) Pressure Injury 07/16/21 Sacrum Medial;Lower Stage 2 -  Partial thickness loss of dermis presenting as a shallow open injury  with a red, pink wound bed without slough. (Active)  07/16/21 0430  Location: Sacrum  Location Orientation: Medial;Lower  Staging: Stage 2 -  Partial thickness loss of dermis presenting as a shallow open injury with a red, pink wound bed without slough.  Wound Description (Comments):   Present on Admission: Yes  Dressing Type Foam - Lift dressing to assess site every shift 06/17/22 2000    DVT prophylaxis:apixaban (ELIQUIS) tablet 2.5 mg Start: 06/15/22 1200 apixaban (ELIQUIS) tablet 2.5 mg     Code Status: DNR  Family Communication: None at bedside.Called daughter [name] on phone on 06/18/22,call not received  Patient status:Inpatient  Patient is from :Home  Anticipated discharge to: Not sure  Estimated DC date:Not sure   Consultants: Cardiology, nephrology, PCCM  Procedures: Amputation, hemodialysis  Antimicrobials:  Anti-infectives (From admission, onward)    Start     Dose/Rate Route Frequency Ordered Stop   06/15/22 1400  piperacillin-tazobactam (ZOSYN) IVPB 2.25 g        2.25 g 100 mL/hr over 30 Minutes Intravenous Every 8 hours 06/15/22 0715     06/14/22 1400  piperacillin-tazobactam (ZOSYN) IVPB 3.375 g  Status:  Discontinued        3.375 g 12.5 mL/hr over 240 Minutes Intravenous Every 8 hours 06/14/22 0856 06/15/22 0715   06/13/22 1800  vancomycin (VANCOREADY) IVPB 500 mg/100 mL  Status:  Discontinued        500 mg 100 mL/hr over 60 Minutes Intravenous Every M-W-F (Hemodialysis) 06/12/22 1215 06/12/22 1513   06/12/22 1400  vancomycin (VANCOREADY) IVPB 1250 mg/250 mL        1,250 mg 166.7 mL/hr over 90 Minutes Intravenous  Once 06/12/22 1215 06/12/22 1925   06/12/22 1400  piperacillin-tazobactam (ZOSYN) IVPB 2.25 g  Status:  Discontinued        2.25 g 100 mL/hr over 30 Minutes Intravenous Every 8 hours 06/12/22 1215 06/14/22 0856   06/06/22 1200  vancomycin (VANCOREADY) IVPB 500 mg/100 mL  Status:  Discontinued        500 mg 100 mL/hr over 60 Minutes  Intravenous Every M-W-F (Hemodialysis) 06/04/22 1444 06/09/22 1137   06/04/22 1800  Ampicillin-Sulbactam (UNASYN) 3 g in sodium chloride 0.9 % 100 mL IVPB  Status:  Discontinued        3 g 200 mL/hr over 30 Minutes Intravenous Every 24 hours 06/04/22 1453 06/09/22 1137   06/04/22 1630  vancomycin (VANCOREADY) IVPB 1250 mg/250 mL        1,250 mg 166.7 mL/hr over 90 Minutes Intravenous  Once 06/04/22 1444 06/04/22 1835       Subjective: Patient seen and examined at the bedside at dialysis suite.  He was very hypotensive, systolic blood pressure in the range of 70s.  Patient was alert and awake.  Denies any complaints but was confused.  Follows commands.  Very deconditioned and weak.  Objective: Vitals:   06/17/22 1933 06/18/22 0453 06/18/22 0839 06/18/22 0842  BP: (!) 95/39 138/88 (!) 72/54 (!) 94/30  Pulse: 99 (!) 106 (!) 103 (!) 104  Resp: 16 16 18 18   Temp: 98.1 F (36.7 C) (!) 97.5 F (36.4 C) 97.7 F (36.5 C)   TempSrc:  Oral Oral   SpO2: 100% 100% 100%   Weight:      Height:  Intake/Output Summary (Last 24 hours) at 06/18/2022 1215 Last data filed at 06/18/2022 0305 Gross per 24 hour  Intake 100 ml  Output --  Net 100 ml   Filed Weights   06/06/22 0809 06/06/22 1230 06/17/22 1556  Weight: 56.9 kg 55.9 kg 57.7 kg    Examination:  General exam: Extremely deconditioned, weak, chronically ill looking Respiratory system:  no wheezes or crackles, diminished air sounds on bases Cardiovascular system: S1 & S2 heard, RRR.  Sinus tachycardia Gastrointestinal system: Abdomen is nondistended, soft and nontender. Central nervous system: Alert and awake, not sure he is oriented Extremities: Bilateral BKA, dialysis catheter on the right thigh Skin: No rashes, no ulcers,no icterus     Data Reviewed: I have personally reviewed following labs and imaging studies  CBC: Recent Labs  Lab 06/12/22 1650 06/14/22 0525 06/15/22 0502 06/16/22 0618 06/17/22 0434  06/17/22 1815 06/18/22 0438  WBC 14.6*   < > 12.3* 12.9* 15.2* 14.6* 13.4*  NEUTROABS 13.2*  --   --   --   --   --  11.1*  HGB 7.1*   < > 9.4* 10.3* 10.2* 10.3* 10.7*  HCT 22.3*   < > 29.3* 32.6* 32.6* 32.9* 33.0*  MCV 80.8   < > 82.1 83.8 86.2 85.5 84.2  PLT 292   < > 335 379 408* 426* 425*   < > = values in this interval not displayed.   Basic Metabolic Panel: Recent Labs  Lab 06/12/22 1119 06/14/22 0525 06/16/22 0618 06/16/22 1232 06/17/22 0434 06/17/22 1815 06/18/22 0438  NA 136   < > 138 137 138 135 137  K 4.4   < > 3.7 3.8 3.6 3.4* 3.3*  CL 101   < > 103 102 104 96* 101  CO2 27   < > 24 21* 26 27 24   GLUCOSE 120*   < > 66* 72 103* 124* 97  BUN 33*   < > 16 17 23  26* 28*  CREATININE 3.19*   < > 2.04* 2.25* 2.82* 3.07* 3.42*  CALCIUM 9.1   < > 8.6* 8.8* 9.0 8.5* 8.8*  MG 1.9  --   --  1.6* 2.1  --   --   PHOS 2.9  --   --   --   --   --   --    < > = values in this interval not displayed.     Recent Results (from the past 240 hour(s))  Culture, blood (Routine X 2) w Reflex to ID Panel     Status: None   Collection Time: 06/12/22 11:20 AM   Specimen: BLOOD  Result Value Ref Range Status   Specimen Description BLOOD BLOOD RIGHT HAND  Final   Special Requests   Final    BOTTLES DRAWN AEROBIC AND ANAEROBIC Blood Culture adequate volume   Culture   Final    NO GROWTH 5 DAYS Performed at [facility], [address] [place], [place] [address]    Report Status 06/17/2022 FINAL  Final  MRSA Next Gen by PCR, Nasal     Status: None   Collection Time: 06/12/22 12:34 PM   Specimen: Nasal Mucosa; Nasal Swab  Result Value Ref Range Status   MRSA by PCR Next Gen NOT DETECTED NOT DETECTED Final    Comment: (NOTE) The GeneXpert MRSA Assay (FDA approved for NASAL specimens only), is one component of a comprehensive MRSA colonization surveillance program. It is not intended to diagnose MRSA infection nor to  guide or monitor treatment for MRSA infections. Test  performance is not FDA approved in patients less than 30 years old. Performed at [facility], [address] [place], [place] [address]   Culture, blood (Routine X 2) w Reflex to ID Panel     Status: None   Collection Time: 06/12/22 12:36 PM   Specimen: BLOOD RIGHT HAND  Result Value Ref Range Status   Specimen Description BLOOD RIGHT HAND  Final   Special Requests   Final    BOTTLES DRAWN AEROBIC AND ANAEROBIC Blood Culture adequate volume   Culture   Final    NO GROWTH 5 DAYS Performed at [facility], [address], [place], [place] [address]    Report Status 06/17/2022 FINAL  Final     Radiology Studies: No results found.  Scheduled Meds:  (feeding supplement) PROSource Plus  30 mL Oral TID BM   apixaban  2.5 mg Oral BID   vitamin C  500 mg Oral BID   atorvastatin  40 mg Oral Daily   Chlorhexidine Gluconate Cloth  6 each Topical Daily   Chlorhexidine Gluconate Cloth  6 each Topical Once   DULoxetine  40 mg Oral BID   epoetin (EPOGEN/PROCRIT) injection  4,000 Units Intravenous Q M,W,F-HD   feeding supplement (NEPRO CARB STEADY)  237 mL Oral TID BM   midodrine  10 mg Oral TID WC   multivitamin  1 tablet Oral QHS   mouth rinse  15 mL Mouth Rinse 4 times per day   pantoprazole (PROTONIX) IV  40 mg Intravenous Q24H   polyethylene glycol  17 g Oral Daily   senna-docusate  1 tablet Oral QHS   sevelamer carbonate  800 mg Oral TID WC   Continuous Infusions:  levETIRAcetam Stopped (06/17/22 2140)   levETIRAcetam Stopped (06/15/22 2218)   piperacillin-tazobactam (ZOSYN)  IV Stopped (06/18/22 0645)     LOS: 64 days   Shelly Coss, MD [facility] P9/01/2022, 12:15 PM

## 2022-06-18 NOTE — Progress Notes (Signed)
Pre hd rn assessment 

## 2022-06-18 NOTE — Progress Notes (Signed)
Rounding Note    Patient Name: Ernest [name] Date of Encounter: 06/18/2022  [place] Cardiologist: Nelva Bush, MD   Subjective   Patient somnolent but opens eyes to voice and states that he is nodding pain before falling asleep again.  Patient's nurse notes that his blood pressures have been variable but generally soft.  It is difficult to obtain pressures given that he has failed dialysis fistulas in both arms and is status post bilateral above-the-knee amputations.  Inpatient Medications    Scheduled Meds:  (feeding supplement) PROSource Plus  30 mL Oral TID BM   apixaban  2.5 mg Oral BID   vitamin C  500 mg Oral BID   atorvastatin  40 mg Oral Daily   Chlorhexidine Gluconate Cloth  6 each Topical Daily   Chlorhexidine Gluconate Cloth  6 each Topical Once   DULoxetine  40 mg Oral BID   epoetin (EPOGEN/PROCRIT) injection  4,000 Units Intravenous Q M,W,F-HD   feeding supplement (NEPRO CARB STEADY)  237 mL Oral TID BM   midodrine  10 mg Oral TID WC   multivitamin  1 tablet Oral QHS   mouth rinse  15 mL Mouth Rinse 4 times per day   pantoprazole (PROTONIX) IV  40 mg Intravenous Q24H   polyethylene glycol  17 g Oral Daily   senna-docusate  1 tablet Oral QHS   sevelamer carbonate  800 mg Oral TID WC   Continuous Infusions:  levETIRAcetam Stopped (06/17/22 2140)   levETIRAcetam Stopped (06/15/22 2218)   piperacillin-tazobactam (ZOSYN)  IV Stopped (06/18/22 0645)   PRN Meds: haloperidol lactate, HYDROmorphone (DILAUDID) injection, ondansetron **OR** ondansetron (ZOFRAN) IV, ondansetron (ZOFRAN) IV, mouth rinse, oxyCODONE-acetaminophen   Vital Signs    Vitals:   06/17/22 1933 06/18/22 0453 06/18/22 0839 06/18/22 0842  BP: (!) 95/39 138/88 (!) 72/54 (!) 94/30  Pulse: 99 (!) 106 (!) 103 (!) 104  Resp: 16 16 18 18   Temp: 98.1 F (36.7 C) (!) 97.5 F (36.4 C) 97.7 F (36.5 C)   TempSrc:  Oral Oral   SpO2: 100% 100% 100%   Weight:      Height:         Intake/Output Summary (Last 24 hours) at 06/18/2022 1042 Last data filed at 06/18/2022 0305 Gross per 24 hour  Intake 100 ml  Output --  Net 100 ml      06/17/2022    3:56 PM 06/06/2022   12:30 PM 06/06/2022    8:09 AM  Last 3 Weights  Weight (lbs) 127 lb 3.3 oz 123 lb 3.8 oz 125 lb 7.1 oz  Weight (kg) 57.7 kg 55.9 kg 56.9 kg      Telemetry    Normal sinus rhythm and sinus tachycardia with PVCs and 4 beat run of NSVT - Personally Reviewed  ECG    No new tracing  Physical Exam   GEN: Chronically ill-appearing man lying in bed. Neck: Able to assess JVP due to positioning. Cardiac: Tachycardic but regular without murmurs. Respiratory: Clear anteriorly. GI: Soft, nontender, non-distended  MS: Patient's status post bilateral AKA's.  Right AKA stump without drainage or erythema. Neuro: Somnolent.  Briefly answers questions and then falls asleep again. Psych: Too somnolent to assess.  Labs    High Sensitivity Troponin:   Recent Labs  Lab 06/12/22 1119 06/12/22 1644 06/12/22 1950 06/13/22 1954 06/13/22 2137  TROPONINIHS 577* 1,183* 1,505* 2,114* 2,025*     Chemistry Recent Labs  Lab 06/12/22 1119 06/14/22 0525 06/16/22 1232  06/17/22 0434 06/17/22 1815 06/18/22 0438  NA 136   < > 137 138 135 137  K 4.4   < > 3.8 3.6 3.4* 3.3*  CL 101   < > 102 104 96* 101  CO2 27   < > 21* 26 27 24   GLUCOSE 120*   < > 72 103* 124* 97  BUN 33*   < > 17 23 26* 28*  CREATININE 3.19*   < > 2.25* 2.82* 3.07* 3.42*  CALCIUM 9.1   < > 8.8* 9.0 8.5* 8.8*  MG 1.9  --  1.6* 2.1  --   --   PROT  --   --   --   --  5.4*  --   ALBUMIN  --   --   --   --  2.1*  --   AST  --   --   --   --  16  --   ALT  --   --   --   --  10  --   ALKPHOS  --   --   --   --  85  --   BILITOT  --   --   --   --  1.1  --   GFRNONAA 20*   < > 30* 23* 21* 18*  ANIONGAP 8   < > 14 8 12 12    < > = values in this interval not displayed.    Lipids No results for input(s): "CHOL", "TRIG", "HDL", "LABVLDL",  "LDLCALC", "CHOLHDL" in the last 168 hours.  Hematology Recent Labs  Lab 06/17/22 0434 06/17/22 1815 06/18/22 0438  WBC 15.2* 14.6* 13.4*  RBC 3.78* 3.85* 3.92*  HGB 10.2* 10.3* 10.7*  HCT 32.6* 32.9* 33.0*  MCV 86.2 85.5 84.2  MCH 27.0 26.8 27.3  MCHC 31.3 31.3 32.4  RDW 21.4* 21.4* 21.9*  PLT 408* 426* 425*   Thyroid No results for input(s): "TSH", "FREET4" in the last 168 hours.  BNP Recent Labs  Lab 06/12/22 1119 06/17/22 1815  BNP >4,500.0* >4,500.0*    DDimer No results for input(s): "DDIMER" in the last 168 hours.   Radiology    No results found.  Cardiac Studies   TTE (06/14/2022):  1. Left ventricular ejection fraction, by estimation, is 20 to 25%. The  left ventricle has severely decreased function. The left ventricle  demonstrates global hypokinesis. Indeterminate diastolic filling due to  E-A fusion.   2. Right ventricular systolic function is moderately reduced. The right  ventricular size is moderately enlarged. There is mildly elevated  pulmonary artery systolic pressure. The estimated right ventricular  systolic pressure is 17.5 mmHg.   3. Moderate pleural effusion in the left lateral region.   4. The mitral valve is normal in structure. Mild mitral valve  regurgitation. No evidence of mitral stenosis.   5. The aortic valve is normal in structure. Aortic valve regurgitation is  not visualized. Aortic valve sclerosis is present, with no evidence of  aortic valve stenosis.   6. The inferior vena cava is normal in size with greater than 50%  respiratory variability, suggesting right atrial pressure of 3 mmHg.  Patient Profile     76 y.o. male with ESRD on HD, CAD status post CABG, status post mitral valve repair, chronic systolic and diastolic heart failure with recovered LVEF, persistent atrial flutter, NSVT, CVA, seizures, hypertension, hyperlipidemia, diabetes, PAD status post bilateral AKA, and COPD here with septic shock following left  AKA  Assessment &  Plan    Acute on chronic HFrEF: Volume assessment is challenging and primarily managed with hemodialysis.  LVEF newly reduced to 20-25%.  Unclear if this is due to progression of ischemic heart disease or stress-induced in the setting of his septic shock following recent AKA.  He is not a candidate for invasive procedures at this time.  His blood pressures have been too low for initiation of any GDMT, though accuracy is questionable given limited sites for measuring blood pressure.  Of note, invasive blood pressures were normal prior to transferring out of the ICU.  I agree with ongoing goals of care discussion with the family, as the patient's prognosis is guarded.  NSTEMI: No chest pain or dyspnea reported.  High-sensitivity troponin I peaked last week at 2114.  No plans for ischemia evaluation given comorbidities.  Continue apixaban and lieu of aspirin given history of atrial flutter, as well as atorvastatin for secondary prevention.  Atrial flutter: Patient currently in sinus tachycardia.  Unable to administer metoprolol due to soft blood pressure.  If blood pressure improves, judicious addition of metoprolol recommended.  Continue apixaban 2.5 mg twice daily based on weight and renal function.  Macie Baum-stage renal disease: Per nephrology.  For questions or updates, please contact [place] Please consult [email or web address] for contact info under [facility] Cardiology.  Signed, Nelva Bush, MD  06/18/2022, 10:42 AM

## 2022-06-19 DIAGNOSIS — I214 Non-ST elevation (NSTEMI) myocardial infarction: Secondary | ICD-10-CM | POA: Diagnosis not present

## 2022-06-19 DIAGNOSIS — I5022 Chronic systolic (congestive) heart failure: Secondary | ICD-10-CM | POA: Diagnosis not present

## 2022-06-19 DIAGNOSIS — I5023 Acute on chronic systolic (congestive) heart failure: Secondary | ICD-10-CM | POA: Diagnosis not present

## 2022-06-19 DIAGNOSIS — I4892 Unspecified atrial flutter: Secondary | ICD-10-CM | POA: Diagnosis not present

## 2022-06-19 DIAGNOSIS — Z7189 Other specified counseling: Secondary | ICD-10-CM | POA: Diagnosis not present

## 2022-06-19 DIAGNOSIS — F4329 Adjustment disorder with other symptoms: Secondary | ICD-10-CM | POA: Diagnosis not present

## 2022-06-19 LAB — BASIC METABOLIC PANEL
Anion gap: 13 (ref 5–15)
Anion gap: 13 (ref 5–15)
BUN: 32 mg/dL — ABNORMAL HIGH (ref 8–23)
BUN: 33 mg/dL — ABNORMAL HIGH (ref 8–23)
CO2: 23 mmol/L (ref 22–32)
CO2: 24 mmol/L (ref 22–32)
Calcium: 9 mg/dL (ref 8.9–10.3)
Calcium: 9.1 mg/dL (ref 8.9–10.3)
Chloride: 102 mmol/L (ref 98–111)
Chloride: 103 mmol/L (ref 98–111)
Creatinine, Ser: 3.96 mg/dL — ABNORMAL HIGH (ref 0.61–1.24)
Creatinine, Ser: 3.99 mg/dL — ABNORMAL HIGH (ref 0.61–1.24)
GFR, Estimated: 15 mL/min — ABNORMAL LOW (ref >60)
GFR, Estimated: 15 mL/min — ABNORMAL LOW (ref >60)
Glucose, Bld: 69 mg/dL — ABNORMAL LOW (ref 70–99)
Glucose, Bld: 82 mg/dL (ref 70–99)
Potassium: 3.8 mmol/L (ref 3.5–5.1)
Potassium: 3.8 mmol/L (ref 3.5–5.1)
Sodium: 138 mmol/L (ref 135–145)
Sodium: 140 mmol/L (ref 135–145)

## 2022-06-19 LAB — CBC
HCT: 34.7 % — ABNORMAL LOW (ref 39.0–52.0)
Hemoglobin: 10.9 g/dL — ABNORMAL LOW (ref 13.0–17.0)
MCH: 26.8 pg (ref 26.0–34.0)
MCHC: 31.4 g/dL (ref 30.0–36.0)
MCV: 85.5 fL (ref 80.0–100.0)
Platelets: 431 10*3/uL — ABNORMAL HIGH (ref 150–400)
RBC: 4.06 MIL/uL — ABNORMAL LOW (ref 4.22–5.81)
RDW: 22.1 % — ABNORMAL HIGH (ref 11.5–15.5)
WBC: 14.5 10*3/uL — ABNORMAL HIGH (ref 4.0–10.5)
nRBC: 0 % (ref 0.0–0.2)

## 2022-06-19 LAB — GLUCOSE, CAPILLARY: Glucose-Capillary: 98 mg/dL (ref 70–99)

## 2022-06-19 LAB — MAGNESIUM
Magnesium: 2.1 mg/dL (ref 1.7–2.4)
Magnesium: 2.1 mg/dL (ref 1.7–2.4)

## 2022-06-19 LAB — PHOSPHORUS
Phosphorus: 4.5 mg/dL (ref 2.5–4.6)
Phosphorus: 4.6 mg/dL (ref 2.5–4.6)

## 2022-06-19 NOTE — TOC Progression Note (Signed)
Transition of Care [facility]) - Progression Note    Patient Details  Name: Ernest [name] MRN: [id number] Date of Birth: [birth year]  Transition of Care [facility]) CM/SW Contact  Laurena Slimmer, RN Phone Number: 06/19/2022, 11:45 PM  Clinical Narrative:    Continuing to follow for possible change in disposition to hospice. Will reassess.  Expected Discharge Plan: [place] (with hospice) Barriers to Discharge: Continued Medical Work up  Expected Discharge Plan and Services Expected Discharge Plan: [place] (with hospice)     Post Acute Care Choice: [place] Living arrangements for the past 2 months: [place]                                       Social Determinants of Health (SDOH) Interventions    Readmission Risk Interventions    06/10/2022   12:03 PM 06/28/2021   10:38 AM  Readmission Risk Prevention Plan  Transportation Screening Complete Complete  Medication Review (RN Care Manager) Complete Complete  PCP or Specialist appointment within 3-5 days of discharge Complete Complete  HRI or Home Care Consult Complete Complete  SW Recovery Care/Counseling Consult Complete Complete  Palliative Care Screening Not Applicable Not Applicable  Skilled Nursing Facility Complete Complete

## 2022-06-19 NOTE — Progress Notes (Signed)
Rounding Note    Patient Name: Ernest [name] Date of Encounter: 06/19/2022  [place] Cardiologist: Nelva Bush, MD   Subjective   Patient seen on AM rounds. Remains somnolent but does open eyes to voice and will nod his head to answer some questions. Transferred back to stepdown due to lower blood pressures. Patient unable to have HD yesterday due to hypotension. Will try again today. Inpatient Medications    Scheduled Meds:  (feeding supplement) PROSource Plus  30 mL Oral TID BM   apixaban  2.5 mg Oral BID   vitamin C  500 mg Oral BID   atorvastatin  40 mg Oral Daily   Chlorhexidine Gluconate Cloth  6 each Topical Daily   DULoxetine  40 mg Oral BID   epoetin (EPOGEN/PROCRIT) injection  4,000 Units Intravenous Q M,W,F-HD   midodrine  10 mg Oral TID WC   multivitamin  1 tablet Oral QHS   pantoprazole (PROTONIX) IV  40 mg Intravenous Q24H   polyethylene glycol  17 g Oral Daily   senna-docusate  1 tablet Oral QHS   Continuous Infusions:  levETIRAcetam Stopped (06/17/22 2117)   levETIRAcetam Stopped (06/18/22 1958)   piperacillin-tazobactam (ZOSYN)  IV 2.25 g (06/19/22 0631)   PRN Meds: haloperidol lactate, HYDROmorphone (DILAUDID) injection, ondansetron **OR** ondansetron (ZOFRAN) IV, ondansetron (ZOFRAN) IV, mouth rinse, oxyCODONE-acetaminophen   Vital Signs    Vitals:   06/19/22 0200 06/19/22 0300 06/19/22 0400 06/19/22 0500  BP: 119/68 116/61 113/62   Pulse: (!) 109 (!) 107 (!) 105 (!) 101  Resp: 15 14 14 13   Temp:   98 F (36.7 C)   TempSrc:   Oral   SpO2: 100% 100% 100% 100%  Weight:      Height:        Intake/Output Summary (Last 24 hours) at 06/19/2022 0807 Last data filed at 06/19/2022 0000 Gross per 24 hour  Intake 552.19 ml  Output --  Net 552.19 ml      06/18/2022    4:20 PM 06/17/2022    3:56 PM 06/06/2022   12:30 PM  Last 3 Weights  Weight (lbs) 128 lb 15.5 oz 127 lb 3.3 oz 123 lb 3.8 oz  Weight (kg) 58.5 kg 57.7 kg 55.9 kg       Telemetry    Sinus with unifocal PVC's rates I the 90's, some episodes of SVT noted over night on telemetry - Personally Reviewed  ECG    No new tracings - Personally Reviewed  Physical Exam   GEN: No acute distress. Lying in bed and opens eyes to voice. Neck: No JVD appreciated  Cardiac: RRR, no murmurs, rubs, or gallops.  Respiratory: Clear to auscultation bilaterally.Respirations are unlabored on room air. GI: Soft, nontender, non-distended  MS: No edema; No deformity.Bilateral AKA's. Neuro:  Somnolent Psych: Unable to assess due to baseline  Labs    High Sensitivity Troponin:   Recent Labs  Lab 06/12/22 1119 06/12/22 1644 06/12/22 1950 06/13/22 1954 06/13/22 2137  TROPONINIHS 577* 1,183* 1,505* 2,114* 2,025*     Chemistry Recent Labs  Lab 06/17/22 0434 06/17/22 1815 06/18/22 0438 06/18/22 2335 06/19/22 0420  NA 138 135 137 138 140  K 3.6 3.4* 3.3* 3.8 3.8  CL 104 96* 101 102 103  CO2 26 27 24 23 24   GLUCOSE 103* 124* 97 82 69*  BUN 23 26* 28* 32* 33*  CREATININE 2.82* 3.07* 3.42* 3.96* 3.99*  CALCIUM 9.0 8.5* 8.8* 9.0 9.1  MG 2.1  --   --  2.1 2.1  PROT  --  5.4*  --   --   --   ALBUMIN  --  2.1*  --   --   --   AST  --  16  --   --   --   ALT  --  10  --   --   --   ALKPHOS  --  85  --   --   --   BILITOT  --  1.1  --   --   --   GFRNONAA 23* 21* 18* 15* 15*  ANIONGAP 8 12 12 13 13     Lipids No results for input(s): "CHOL", "TRIG", "HDL", "LABVLDL", "LDLCALC", "CHOLHDL" in the last 168 hours.  Hematology Recent Labs  Lab 06/17/22 1815 06/18/22 0438 06/19/22 0420  WBC 14.6* 13.4* 14.5*  RBC 3.85* 3.92* 4.06*  HGB 10.3* 10.7* 10.9*  HCT 32.9* 33.0* 34.7*  MCV 85.5 84.2 85.5  MCH 26.8 27.3 26.8  MCHC 31.3 32.4 31.4  RDW 21.4* 21.9* 22.1*  PLT 426* 425* 431*   Thyroid No results for input(s): "TSH", "FREET4" in the last 168 hours.  BNP Recent Labs  Lab 06/12/22 1119 06/17/22 1815  BNP >4,500.0* >4,500.0*    DDimer No results for  input(s): "DDIMER" in the last 168 hours.   Radiology    No results found.  Cardiac Studies  TTE 06/14/22 1. Left ventricular ejection fraction, by estimation, is 20 to 25%. The  left ventricle has severely decreased function. The left ventricle  demonstrates global hypokinesis. Indeterminate diastolic filling due to  E-A fusion.   2. Right ventricular systolic function is moderately reduced. The right  ventricular size is moderately enlarged. There is mildly elevated  pulmonary artery systolic pressure. The estimated right ventricular  systolic pressure is 99.3 mmHg.   3. Moderate pleural effusion in the left lateral region.   4. The mitral valve is normal in structure. Mild mitral valve  regurgitation. No evidence of mitral stenosis.   5. The aortic valve is normal in structure. Aortic valve regurgitation is  not visualized. Aortic valve sclerosis is present, with no evidence of  aortic valve stenosis.   6. The inferior vena cava is normal in size with greater than 50%  respiratory variability, suggesting right atrial pressure of 3 mmHg.   Patient Profile     76 y.o. male with a past medical history of ESRD on HD, CAD s/p CABG, s/p mitral valve repair, chronic systolic and diastolic heart failure with recovered LVEF, persistent atrial flutter, NSVT, CVA, seizures, hypertension, hyperlipidemia, diabetes, PAD s/p bilateral AKA, and COPD, who is being seen and evaluated for septic shock following left AKA.  Assessment & Plan    Acute on chronic HFrEF -LVEF 20-25% -unclear if from progression of ischemic heart disease or stress induced from septic shock -currently he is not a candidate for invasive procedures -blood pressures have been and remain to be soft, limiting the initiation of GDMT   NSTEMI -denies chest pain or shortness of breath -Hs troponins peaked 2114 -apixaban in place on aspirin -continue atorvastatin  -EKG as needed for pain or changes  Atrial  flutter -currently sinus -metoptolol on hold due to soft blood pressures -continue apixaban (reduced dose based on weight and renal function)  End stage renal disease -on hemodialysis -management per nephology     For questions or updates, please contact [place] Please consult [email or web address] for contact info under  Signed, Jonah Gingras, NP  06/19/2022, 8:07 AM

## 2022-06-19 NOTE — Progress Notes (Addendum)
Daily Progress Note   Patient Name: Ernest [name]       Date: 06/19/2022 DOB: 11/19/1945  Age: 76 y.o. MRN#: [id number] Attending Physician: Shelly Coss, MD Primary Care Physician: Brayton Mars, MD Admit Date: 06/02/2022  Reason for Consultation/Follow-up: Establishing goals of care  Subjective: Notes and labs reviewed. In to see patient as per staff daughter has arrived to bedside. She discusses changes over the past year. She discusses his current facility. She discusses removing his sock at the facility and having to "peel" it off.   We discussed his diagnosis, prognosis, GOC, EOL wishes disposition and options.  Created space and opportunity for patient  to explore thoughts and feelings regarding current medical information.   A detailed discussion was had today regarding advanced directives.  Concepts specific to code status, artifical feeding and hydration, IV antibiotics and rehospitalization were discussed.  The difference between an aggressive medical intervention path and a comfort care path was discussed.  Values and goals of care important to patient and family were attempted to be elicited.  Discussed limitations of medical interventions to prolong quality of life in some situations and discussed the concept of human mortality.  She discusses her family and the pressure she is under regarding decisions. 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 We discussed God's sovereignty. We discussed patient autonomy and respecting a patient's wishes. She states though he is diagnosed with dementia, it is at beginning stages.  She discusses that he has been clear over the past year he is ready to die, he is ready to go home to be with the Reita Cliche and his wife. She tells me he said this  before a stroke, before covid, and before amputation of his remaining leg. We discussed his pain with dialysis over the past year after having dialysis for the past 20 years.  Patient spoke up. He states "my wife raised her right" talking about his daughter. He confirms he wants to stop dialysis. He confirms he is aware he will die without dialysis. He only wants to focus on comfort for what time he has left. Upon discussing options he states "I want hospice".    He denies having an HPOA. Daughter states there are 4 siblings 2 of which are estranged to the entire family. Upon asking if patient wants  [place] to call anyone, patient states he wants to talk to his son to let him know his decisions. She attempted to call her brother so they could talk and I could answer questions, but he did not answer. She called one of patient's brothers and plans were discussed. I attempted to call son at a later time and was unable to reach him.   I completed a MOST form today with patient and daughter, and it was signed by daughter as patient wanted her to sign, and the signed original was placed in the chart. The form was scanned and sent to medical records for it to be uploaded under ACP tab in Epic. A photocopy was also placed in the chart to be scanned into EMR. The patient outlined their wishes for the following treatment decisions:  Cardiopulmonary Resuscitation: Do Not Attempt Resuscitation (DNR/No CPR)  Medical Interventions: Comfort Measures: Keep clean, warm, and dry. Use medication by any route, positioning, wound care, and other measures to relieve pain and suffering. Use oxygen, suction and manual treatment of airway obstruction as needed for comfort. Do not transfer to the hospital unless comfort needs cannot be met in current location.  Antibiotics: No antibiotics (use other measures to relieve symptoms)  IV Fluids: No IV fluids (provide other measures to ensure comfort)  Feeding Tube: No feeding tube      Length of Stay: 17  Current Medications: Scheduled Meds:   (feeding supplement) PROSource Plus  30 mL Oral TID BM   apixaban  2.5 mg Oral BID   vitamin C  500 mg Oral BID   atorvastatin  40 mg Oral Daily   Chlorhexidine Gluconate Cloth  6 each Topical Daily   DULoxetine  40 mg Oral BID   epoetin (EPOGEN/PROCRIT) injection  4,000 Units Intravenous Q M,W,F-HD   midodrine  10 mg Oral TID WC   multivitamin  1 tablet Oral QHS   pantoprazole (PROTONIX) IV  40 mg Intravenous Q24H   polyethylene glycol  17 g Oral Daily   senna-docusate  1 tablet Oral QHS    Continuous Infusions:  levETIRAcetam Stopped (06/17/22 2117)   levETIRAcetam Stopped (06/18/22 1958)    PRN Meds: haloperidol lactate, HYDROmorphone (DILAUDID) injection, ondansetron **OR** ondansetron (ZOFRAN) IV, ondansetron (ZOFRAN) IV, mouth rinse, oxyCODONE-acetaminophen  Physical Exam Pulmonary:     Effort: Pulmonary effort is normal.  Neurological:     Mental Status: He is alert.             Vital Signs: BP 126/68   Pulse 98   Temp 97.8 F (36.6 C)   Resp 12   Ht '5\' 9"'  (1.753 m)   Wt 58.5 kg   SpO2 95%   BMI 19.05 kg/m  SpO2: SpO2: 95 % O2 Device: O2 Device: Room Air O2 Flow Rate: O2 Flow Rate (L/min): 2 L/min  Intake/output summary:  Intake/Output Summary (Last 24 hours) at 06/19/2022 1534 Last data filed at 06/19/2022 0000 Gross per 24 hour  Intake 552.19 ml  Output --  Net 552.19 ml   LBM: Last BM Date : 06/18/22 Baseline Weight: Weight: 66 kg Most recent weight: Weight: 58.5 kg   Patient Active Problem List   Diagnosis Date Noted   Protein-calorie malnutrition, severe 06/05/2022   Adjustment reaction with aggression 06/05/2022   Impaired fasting glucose 06/03/2022   Hypokalemia 06/03/2022   Dry gangrene ([place]) 06/02/2022   Chronic respiratory failure with hypoxia ([place]) 10/02/2021   Slurred speech 10/02/2021   Osteomyelitis of right foot ([place])  Acquired absence of right leg above knee ([place])  07/21/2021   Pressure injury of skin 07/19/2021   Pressure ulcer 07/16/2021   Septic shock ([place])    Wet gangrene ([place]) 07/10/2021   S/P AKA (above knee amputation), right ([place]) 07/10/2021   Atherosclerosis of native arteries of the extremities with gangrene ([place]) 07/08/2021   Sepsis ([place]) 06/27/2021   Bacterial infection, unspecified 03/31/2021   Contact with and (suspected) exposure to covid-19 03/13/2021   COVID-19 03/07/2021   Disorder of phosphorus metabolism, unspecified 02/20/2021   Hypercalcemia 12/08/2020   Coronary artery disease involving native coronary artery of native heart without angina pectoris 11/18/2020   Diarrhea, unspecified 11/18/2020   Fever, unspecified 11/18/2020   Other fluid overload 11/18/2020   Pain, unspecified 11/18/2020   Pleural effusion on left 09/30/2019   Acute respiratory failure with hypoxia (HCC) 09/30/2019   Pleural effusion 09/30/2019   Chest pain 12/02/2018   Vascular dementia ([place]) 10/10/2017   Atrial flutter ([place]) 10/10/2017   Altered mental status, unspecified 10/09/2017   Other malaise 10/09/2017   S/P CABG x 4 10/04/2017   Cerebral embolism with cerebral infarction 09/20/2017   NSVT (nonsustained ventricular tachycardia) (HCC)    Elevated troponin    Dyspnea 09/08/2017   Non-ST elevation MI (NSTEMI) ([place])    Acute on chronic combined systolic and diastolic CHF (congestive heart failure) ([place])    End-stage renal disease on hemodialysis ([place])    Left leg weakness    TIA (transient ischemic attack) 10/27/2016   Essential hypertension 09/18/2015   Dependence on renal dialysis ([place]) 08/09/2015   History of CVA (cerebrovascular accident) 08/07/2015   Seizure, late effect of stroke ([place]) 08/07/2015   Diabetic peripheral neuropathy ([place]) 08/07/2015   Unspecified protein-calorie malnutrition ([place]) 05/27/2015   Seizure ([place]) 04/29/2015   Coagulation defect, unspecified ([place]) 07/16/2014   Type 2 diabetes mellitus with diabetic peripheral  angiopathy without gangrene ([place]) 07/01/2014   Pruritus, unspecified 05/26/2014   Elevated prostate specific antigen (PSA) 08/03/2013   Encounter for immunization 07/10/2013   Obstructive sleep apnea 06/16/2013   Hyperlipidemia associated with type 2 diabetes mellitus ([place])    ESRD (end stage renal disease) ([place])    Erectile dysfunction associated with type 2 diabetes mellitus ([place])    Hypertension due to end stage renal disease caused by type 2 diabetes mellitus, on dialysis ([place])    COPD (chronic obstructive pulmonary disease) ([place])    Chronic HFrEF (heart failure with reduced ejection fraction) ([place])    Anemia of chronic disease 12/27/2011   Long term (current) use of insulin ([place]) 11/03/2009   Gout, unspecified 02/28/2009   Iron deficiency anemia, unspecified 10/30/2008   Secondary hyperparathyroidism of renal origin ([place]) 10/30/2008    Palliative Care Assessment & Plan   Recommendations/Plan: Patient wants hospice facility placement. Daughter is in agreement. Attempted to call son to answer questions but was unable to reach him.     Code Status:    Code Status Orders  (From admission, onward)           Start     Ordered   06/05/22 1315  Do not attempt resuscitation (DNR)  Continuous       Question Answer Comment  In the event of cardiac or respiratory ARREST Do not call a "code blue"   In the event of cardiac or respiratory ARREST Do not perform Intubation, CPR, defibrillation or ACLS   In the event of cardiac or respiratory ARREST Use medication by any route, position, wound  care, and other measures to relive pain and suffering. May use oxygen, suction and manual treatment of airway obstruction as needed for comfort.      06/05/22 1314           Code Status History     Date Active Date Inactive Code Status Order ID Comments User Context   06/02/2022 2043 06/05/2022 1314 Full Code [id number]  Sid Falcon, MD Inpatient   11/23/2021 1044 11/23/2021 1756 Full Code  [id number]  Algernon Huxley, MD Inpatient   09/26/2021 1347 09/29/2021 2136 DNR [id number]  Nolberto Hanlon, MD Inpatient   09/26/2021 1342 09/26/2021 1347 Full Code [id number]  Nolberto Hanlon, MD Inpatient   09/25/2021 1606 09/26/2021 1342 DNR [id number]  Sidney Ace, Arvella Merles, MD ED   07/13/2021 1456 07/21/2021 0009 DNR [id number]  Asencion Gowda, NP Inpatient   07/08/2021 1625 07/13/2021 1455 Full Code [id number]  Elmore Guise, MD Inpatient   06/27/2021 0250 07/08/2021 1456 Full Code [id number]  Mansy, Arvella Merles, MD ED   09/30/2019 1657 09/30/2019 2058 Full Code [id number]  Ina Homes, MD ED   09/08/2017 0359 10/07/2017 2150 Full Code [id number]  Collier Salina, MD ED   10/27/2016 2044 10/29/2016 2121 Full Code [id number]  Zada Finders, MD Inpatient   07/13/2015 1652 07/21/2015 1336 Full Code [id number]  Cathlyn Parsons, [name] Inpatient   07/11/2015 1629 07/13/2015 1652 Full Code [id number]  Juluis Mire, MD ED   04/29/2015 1352 04/30/2015 2038 Full Code [id number]  Lucious Groves, DO Inpatient   08/27/2013 0427 08/27/2013 1845 Full Code [id number]  Mal Misty, MD Inpatient       Prognosis:  < 2 weeks Stopping dialysis   Care plan was discussed with attending and RN  Thank you for allowing the Palliative Medicine Team to assist in the care of this patient.  Asencion Gowda, NP  Please contact Palliative Medicine Team phone at [phone] for questions and concerns.

## 2022-06-19 NOTE — Progress Notes (Addendum)
[facility]  ROUNDING NOTE   Subjective:   Ernest [name] is a 76 year old African-American male with past medical conditions including CAD, COPD, CHF, diabetes, stroke with late seizures, hypertension, and end-stage renal disease on hemodialysis.  Patient presents to the emergency department from [facility] with concerns of left foot wound and has been admitted for Dry gangrene [facility]) [I96]  Patient is known to our practice from previous admissions and receives outpatient dialysis treatments at [facility] on a MWF schedule, supervised by [facility] physicians.    Update: Patient seen and evaluated at bedside in ICU Patient resting comfortably No pressors or sedation Patient alert and responding to simple questions Denies pain or discomfort  Objective:  Vital signs in last 24 hours:  Temp:  [97.5 F (36.4 C)-98.7 F (37.1 C)] 98 F (36.7 C) (09/05 0400) Pulse Rate:  [71-146] 101 (09/05 0500) Resp:  [13-16] 13 (09/05 0500) BP: (48-132)/(17-117) 113/62 (09/05 0400) SpO2:  [100 %] 100 % (09/05 0500) Weight:  [58.5 kg] 58.5 kg (09/04 1620)  Weight change: 0.8 kg Filed Weights   06/06/22 1230 06/17/22 1556 06/18/22 1620  Weight: 55.9 kg 57.7 kg 58.5 kg    Intake/Output: I/O last 3 completed shifts: In: 602.2 [IV Piggyback:602.2] Out: -    Intake/Output this shift:  No intake/output data recorded.  Physical Exam: General: NAD  Head: Normocephalic, atraumatic. Moist oral mucosal membranes  Eyes: Anicteric  Lungs:  Clear to auscultation,  normal effort  Heart: regular rhythm  Abdomen:  Soft, nontender, nondistended  Extremities: No peripheral edema.  Bilateral AKA  Neurologic: Alert, responsive to simple questions  Skin: Warm, dry, left AKA, staples CDI  Access: Right thigh PermCath    Basic Metabolic Panel: Recent Labs  Lab 06/12/22 1119 06/14/22 0525 06/16/22 1232 06/17/22 0434 06/17/22 1815 06/18/22 0438 06/18/22 2335 06/19/22 0420   NA 136   < > 137 138 135 137 138 140  K 4.4   < > 3.8 3.6 3.4* 3.3* 3.8 3.8  CL 101   < > 102 104 96* 101 102 103  CO2 27   < > 21* 26 27 24 23 24   GLUCOSE 120*   < > 72 103* 124* 97 82 69*  BUN 33*   < > 17 23 26* 28* 32* 33*  CREATININE 3.19*   < > 2.25* 2.82* 3.07* 3.42* 3.96* 3.99*  CALCIUM 9.1   < > 8.8* 9.0 8.5* 8.8* 9.0 9.1  MG 1.9  --  1.6* 2.1  --   --  2.1 2.1  PHOS 2.9  --   --   --   --   --  4.5 4.6   < > = values in this interval not displayed.     Liver Function Tests: Recent Labs  Lab 06/17/22 1815  AST 16  ALT 10  ALKPHOS 85  BILITOT 1.1  PROT 5.4*  ALBUMIN 2.1*    No results for input(s): "LIPASE", "AMYLASE" in the last 168 hours. No results for input(s): "AMMONIA" in the last 168 hours.  CBC: Recent Labs  Lab 06/12/22 1650 06/14/22 0525 06/16/22 0618 06/17/22 0434 06/17/22 1815 06/18/22 0438 06/19/22 0420  WBC 14.6*   < > 12.9* 15.2* 14.6* 13.4* 14.5*  NEUTROABS 13.2*  --   --   --   --  11.1*  --   HGB 7.1*   < > 10.3* 10.2* 10.3* 10.7* 10.9*  HCT 22.3*   < > 32.6* 32.6* 32.9* 33.0* 34.7*  MCV 80.8   < > 83.8 86.2 85.5 84.2 85.5  PLT 292   < > 379 408* 426* 425* 431*   < > = values in this interval not displayed.     Cardiac Enzymes: Recent Labs  Lab 06/17/22 1815  CKTOTAL 17*     BNP: Invalid input(s): "POCBNP"  CBG: Recent Labs  Lab 06/12/22 1229 06/12/22 1344 06/18/22 1633 06/19/22 0640  GLUCAP 116* 146* 71 98     Microbiology: Results for orders placed or performed during the hospital encounter of 06/02/22  Blood culture (routine x 2)     Status: None   Collection Time: 06/02/22  4:24 PM   Specimen: BLOOD  Result Value Ref Range Status   Specimen Description BLOOD LEFT ANTECUBITAL  Final   Special Requests   Final    BOTTLES DRAWN AEROBIC AND ANAEROBIC Blood Culture results may not be optimal due to an inadequate volume of blood received in culture bottles   Culture   Final    NO GROWTH 5 DAYS Performed at  [facility], [address] [place], [place] [address]    Report Status 06/07/2022 FINAL  Final  Blood culture (routine x 2)     Status: None   Collection Time: 06/02/22  8:45 PM   Specimen: BLOOD LEFT HAND  Result Value Ref Range Status   Specimen Description BLOOD LEFT HAND  Final   Special Requests   Final    BOTTLES DRAWN AEROBIC AND ANAEROBIC Blood Culture adequate volume   Culture   Final    NO GROWTH 5 DAYS Performed at [facility], [address], [place], [place] [address]    Report Status 06/07/2022 FINAL  Final  Culture, blood (Routine X 2) w Reflex to ID Panel     Status: None   Collection Time: 06/12/22 11:20 AM   Specimen: BLOOD  Result Value Ref Range Status   Specimen Description BLOOD BLOOD RIGHT HAND  Final   Special Requests   Final    BOTTLES DRAWN AEROBIC AND ANAEROBIC Blood Culture adequate volume   Culture   Final    NO GROWTH 5 DAYS Performed at [facility], [address] [place], [place] [address]    Report Status 06/17/2022 FINAL  Final  MRSA Next Gen by PCR, Nasal     Status: None   Collection Time: 06/12/22 12:34 PM   Specimen: Nasal Mucosa; Nasal Swab  Result Value Ref Range Status   MRSA by PCR Next Gen NOT DETECTED NOT DETECTED Final    Comment: (NOTE) The GeneXpert MRSA Assay (FDA approved for NASAL specimens only), is one component of a comprehensive MRSA colonization surveillance program. It is not intended to diagnose MRSA infection nor to guide or monitor treatment for MRSA infections. Test performance is not FDA approved in patients less than 11 years old. Performed at [facility], [address] [place], [place] [address]   Culture, blood (Routine X 2) w Reflex to ID Panel     Status: None   Collection Time: 06/12/22 12:36 PM   Specimen: BLOOD RIGHT HAND  Result Value Ref Range Status   Specimen Description BLOOD RIGHT HAND  Final   Special Requests   Final    BOTTLES DRAWN AEROBIC AND  ANAEROBIC Blood Culture adequate volume   Culture   Final    NO GROWTH 5 DAYS Performed at [facility], [address], [place], [place] [address]    Report Status  06/17/2022 FINAL  Final    Coagulation Studies: No results for input(s): "LABPROT", "INR" in the last 72 hours.  Urinalysis: No results for input(s): "COLORURINE", "LABSPEC", "PHURINE", "GLUCOSEU", "HGBUR", "BILIRUBINUR", "KETONESUR", "PROTEINUR", "UROBILINOGEN", "NITRITE", "LEUKOCYTESUR" in the last 72 hours.  Invalid input(s): "APPERANCEUR"    Imaging: No results found.   Medications:    levETIRAcetam Stopped (06/17/22 2117)   levETIRAcetam Stopped (06/18/22 1958)   piperacillin-tazobactam (ZOSYN)  IV 2.25 g (06/19/22 0631)    (feeding supplement) PROSource Plus  30 mL Oral TID BM   apixaban  2.5 mg Oral BID   vitamin C  500 mg Oral BID   atorvastatin  40 mg Oral Daily   Chlorhexidine Gluconate Cloth  6 each Topical Daily   DULoxetine  40 mg Oral BID   epoetin (EPOGEN/PROCRIT) injection  4,000 Units Intravenous Q M,W,F-HD   midodrine  10 mg Oral TID WC   multivitamin  1 tablet Oral QHS   pantoprazole (PROTONIX) IV  40 mg Intravenous Q24H   polyethylene glycol  17 g Oral Daily   senna-docusate  1 tablet Oral QHS   haloperidol lactate, HYDROmorphone (DILAUDID) injection, ondansetron **OR** ondansetron (ZOFRAN) IV, ondansetron (ZOFRAN) IV, mouth rinse, oxyCODONE-acetaminophen  Assessment/ Plan:  Ernest [name] is a 76 y.o.  male  with past medical conditions including CAD, COPD, CHF, diabetes, stroke with late seizures, hypertension, and end-stage renal disease on hemodialysis.  Patient presents to the emergency department from [facility] with concerns of left foot wound and has been admitted for 06/02/2022  3:25 PM   [facility]/MWF/right thigh PermCath  End-stage renal disease on hemodialysis.   Patient was unable to receive dialysis yesterday due to hypotension.  Palliative  care has been following and continues ongoing conversations with daughter concerning goals of care and possible comfort care.  Will reattempt dialysis tomorrow.  2. Anemia of chronic kidney disease Lab Results  Component Value Date   HGB 10.9 (L) 06/19/2022    Hemoglobin at goal.  Continue low-dose EPO with dialysis treatments.  3. Secondary Hyperparathyroidism: Lab Results  Component Value Date   PTH 63 10/05/2017   PTH Comment 10/05/2017   CALCIUM 9.1 06/19/2022   CAION 1.05 (L) 07/12/2020   PHOS 4.6 06/19/2022  Calcium and phosphorus within target range. Continue sevelamer with meals.   4.  Hypertension with chronic kidney disease.  Previously was on amlodipine. Now on midodrine only due to hypotension.   5.  Peripheral vascular disease Patient is now s/p AKA    LOS: [place] 9/5/202310:18 AM   Patient was examined and evaluated with Colon Flattery, NP.  Plan of care was formulated and discussed with NP.  I agree with the note as documented with edits.

## 2022-06-19 NOTE — Progress Notes (Signed)
Received request from MD/Dr. Manuella Ghazi, to contact family to explain services.   MSW contacted daughter/Heather and reports that PMT has recommended hospice care. [name] reports that she is receptive for hospice evaluation but prefers to speak tomorrow AM as she just finished a lengthy conversation with PMT provider/Crystal G., NP.   MSW voiced understanding & plan is to f/u tomorrow a.m.  At this time, patient is not under review for [facility] services per family request. Eval. To occur on 9.6.    [facility] information and contact numbers given to family & above information shared with TOC.   Please call with any questions/concerns.    Thank you for the opportunity to participate in this patient's care.   Daphene Calamity, MSW [facility] Liaison  [phone]

## 2022-06-19 NOTE — Progress Notes (Addendum)
PROGRESS NOTE  Ernest [name]  GYB:[id number] DOB: Apr 21, 1946 DOA: 06/02/2022 PCP: Brayton Mars, MD   Brief Narrative: Patient is a 76 year old male with history of deafness, hypertension, hyperlipidemia, diabetes type 2, COPD, CHF with EF of 20 to 25%, coronary disease status post CABG, peripheral artery disease status post right AKA, atrial flutter on Eliquis, history of CVA, dementia, seizure disorder, ESRD on dialysis who presented here from [place] on 8/19 for the evaluation of left foot wound.  Vascular surgery was consulted for admission and he underwent left AKA.  On 8/29.  Patient became hypotensive and started on pressures patient was weaned off on 8/30.  Hospital course  remarkable for persistent hypotension, encephalopathy.  Palliative care was consulted for goals of care.  Patient moved out of ICU on 9/3.  Due to poor prognosis, palliative care discussing with family about comfort care discussion.  We are recommending comfort care, family has not made decision yet   Assessment & Plan:  Principal Problem:   Adjustment reaction with aggression Active Problems:   Dry gangrene ([place])   End-stage renal disease on hemodialysis ([place])   Vascular dementia ([place])   Pressure injury of skin   Secondary hyperparathyroidism of renal origin ([place])   Chronic HFrEF (heart failure with reduced ejection fraction) ([place])   History of CVA (cerebrovascular accident)   Seizure, late effect of stroke ([place])   Essential hypertension   Elevated troponin   Atrial flutter (H[facility])   Anemia of chronic disease   Type 2 diabetes mellitus with diabetic peripheral angiopathy without gangrene ([place])   Atherosclerosis of native arteries of the extremities with gangrene ([place])   Septic shock ([place])   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   Transferred to stepdown on 9/4.Currently on midodrine.  Blood pressure better today. Zosyn was started on 9/1 for suspicion of sepsis but blood cultures have been negative, he is afebrile.  Antibiotic will be discontinued.  He has mild leukocytosis  Acute on chronic systolic congestive heart failure: EF of 20 to 25%.  Cards following, no plan for further ischemic work-up.  Avoid antihypertensives  NSTEMI: No chest pain or dyspnea.  Troponin peaked at 2114.  No plan for further ischemic work-up.  On aspirin, Eliquis, Lipitor  Peripheral artery disease: Status post bilateral AKA.  On Eliquis  Acute hypoxic respiratory failure/COPD: Currently on 2 L of oxygen per minute.  ESRD on dialysis: Dialyzed on Monday, Wednesday, Friday, nephrology following.  Could not undergo dialysis on 9/4 because of low blood pressure  Vascular dementia: Continue delirium precautions.  Haldol for severe agitation.  Frequent reorientation.  Stage II sacral ulcer: Continue local wound care  Anemia of chronic disease: Secondary to ESRD.  Continue to monitor hemoglobin.  Seizure disorder: On Keppra  Atrial flutter: currently rate is controlled.  On Eliquis for anticoagulation.  Not a candidate for beta-blocker due to soft blood pressure.  Severe protein calorie malnutrition: Continue supportive care, nutritionist following.Since he was pocketing food on mouth, speech therapy consulted.On NPO due to aspiration concern  Dry gangrene of left foot: Now status post AKA.  Vascular surgery was following.  Goals of care: Multiple comorbidities.  Very poor prognosis.  Currently declining, hypotensive, encephalopathic.  Palliative care following for goals of care/comfort care recommendation. CODE STATUS DNR.  Nutrition Problem: Severe Malnutrition Etiology: chronic illness (COPD) Pressure Injury 07/16/21 Sacrum Medial;Lower Stage 2 -  Partial thickness loss of dermis presenting as a  shallow open injury with a red, pink wound bed without slough. (Active)  07/16/21 0430  Location: Sacrum  Location Orientation: Medial;Lower  Staging: Stage 2 -  Partial thickness loss of dermis presenting as a shallow open injury with a red, pink wound bed without slough.  Wound Description (Comments):   Present on Admission: Yes  Dressing Type Foam - Lift dressing to assess site every shift 06/19/22 0400    DVT prophylaxis:apixaban (ELIQUIS) tablet 2.5 mg Start: 06/15/22 1200 apixaban (ELIQUIS) tablet 2.5 mg     Code Status: DNR  Family Communication: Discussed with daughter at bedside on 9/4, discussed with brother on phone on 9/4  Patient status:Inpatient  Patient is from :Home  Anticipated discharge to: Not sure  Estimated DC date:Not sure   Consultants: Cardiology, nephrology, PCCM  Procedures: Amputation, hemodialysis  Antimicrobials:  Anti-infectives (From admission, onward)    Start     Dose/Rate Route Frequency Ordered Stop   06/15/22 1400  piperacillin-tazobactam (ZOSYN) IVPB 2.25 g        2.25 g 100 mL/hr over 30 Minutes Intravenous Every 8 hours 06/15/22 0715     06/14/22 1400  piperacillin-tazobactam (ZOSYN) IVPB 3.375 g  Status:  Discontinued        3.375 g 12.5 mL/hr over 240 Minutes Intravenous Every 8 hours 06/14/22 0856 06/15/22 0715   06/13/22 1800  vancomycin (VANCOREADY) IVPB 500 mg/100 mL  Status:  Discontinued        500 mg 100 mL/hr over 60 Minutes Intravenous Every M-W-F (Hemodialysis) 06/12/22 1215 06/12/22 1513   06/12/22 1400  vancomycin (VANCOREADY) IVPB 1250 mg/250 mL        1,250 mg 166.7 mL/hr over 90 Minutes Intravenous  Once 06/12/22 1215 06/12/22 1925   06/12/22 1400  piperacillin-tazobactam (ZOSYN) IVPB 2.25 g  Status:  Discontinued        2.25 g 100 mL/hr over 30 Minutes Intravenous Every 8 hours 06/12/22 1215 06/14/22 0856   06/06/22 1200  vancomycin (VANCOREADY) IVPB 500 mg/100 mL  Status:  Discontinued        500 mg 100 mL/hr  over 60 Minutes Intravenous Every M-W-F (Hemodialysis) 06/04/22 1444 06/09/22 1137   06/04/22 1800  Ampicillin-Sulbactam (UNASYN) 3 g in sodium chloride 0.9 % 100 mL IVPB  Status:  Discontinued        3 g 200 mL/hr over 30 Minutes Intravenous Every 24 hours 06/04/22 1453 06/09/22 1137   06/04/22 1630  vancomycin (VANCOREADY) IVPB 1250 mg/250 mL        1,250 mg 166.7 mL/hr over 90 Minutes Intravenous  Once 06/04/22 1444 06/04/22 1835       Subjective: Patient seen and examined at the bedside noticed abdominal.  Blood pressure is better today.  He is more alert, opens his eyes on calling his name and states that he is fine still confused, not oriented.  Pocketing the pills in mouth.  Objective: Vitals:   06/19/22 0700 06/19/22 0800 06/19/22 0900 06/19/22 1000  BP: 123/66 114/66 115/64 126/68  Pulse: 97 98 94 98  Resp: 12 13 12 12   Temp:  97.8 F (36.6 C)    TempSrc:      SpO2: 100% 94% 95% 95%  Weight:      Height:        Intake/Output Summary (Last 24 hours) at 06/19/2022 1336 Last data  filed at 06/19/2022 0000 Gross per 24 hour  Intake 552.19 ml  Output --  Net 552.19 ml   Filed Weights   06/06/22 1230 06/17/22 1556 06/18/22 1620  Weight: 55.9 kg 57.7 kg 58.5 kg    Examination:   General exam: Extremely deconditioned, chronically ill looking, weak Respiratory system: Diminished air sounds on bases no wheezes or crackles  Cardiovascular system: S1 & S2 heard, RRR.  Gastrointestinal system: Abdomen is nondistended, soft and nontender. Central nervous system: Not oriented, confused Extremities: Mild BKA, dialysis catheter on the right groin Skin: No rashes, no icterus    Data Reviewed: I have personally reviewed following labs and imaging studies  CBC: Recent Labs  Lab 06/12/22 1650 06/14/22 0525 06/16/22 0618 06/17/22 0434 06/17/22 1815 06/18/22 0438 06/19/22 0420  WBC 14.6*   < > 12.9* 15.2* 14.6* 13.4* 14.5*  NEUTROABS 13.2*  --   --   --   --  11.1*  --    HGB 7.1*   < > 10.3* 10.2* 10.3* 10.7* 10.9*  HCT 22.3*   < > 32.6* 32.6* 32.9* 33.0* 34.7*  MCV 80.8   < > 83.8 86.2 85.5 84.2 85.5  PLT 292   < > 379 408* 426* 425* 431*   < > = values in this interval not displayed.   Basic Metabolic Panel: Recent Labs  Lab 06/16/22 1232 06/17/22 0434 06/17/22 1815 06/18/22 0438 06/18/22 2335 06/19/22 0420  NA 137 138 135 137 138 140  K 3.8 3.6 3.4* 3.3* 3.8 3.8  CL 102 104 96* 101 102 103  CO2 21* 26 27 24 23 24   GLUCOSE 72 103* 124* 97 82 69*  BUN 17 23 26* 28* 32* 33*  CREATININE 2.25* 2.82* 3.07* 3.42* 3.96* 3.99*  CALCIUM 8.8* 9.0 8.5* 8.8* 9.0 9.1  MG 1.6* 2.1  --   --  2.1 2.1  PHOS  --   --   --   --  4.5 4.6     Recent Results (from the past 240 hour(s))  Culture, blood (Routine X 2) w Reflex to ID Panel     Status: None   Collection Time: 06/12/22 11:20 AM   Specimen: BLOOD  Result Value Ref Range Status   Specimen Description BLOOD BLOOD RIGHT HAND  Final   Special Requests   Final    BOTTLES DRAWN AEROBIC AND ANAEROBIC Blood Culture adequate volume   Culture   Final    NO GROWTH 5 DAYS Performed at [facility], [address], [place], [place] [address]    Report Status 06/17/2022 FINAL  Final  MRSA Next Gen by PCR, Nasal     Status: None   Collection Time: 06/12/22 12:34 PM   Specimen: Nasal Mucosa; Nasal Swab  Result Value Ref Range Status   MRSA by PCR Next Gen NOT DETECTED NOT DETECTED Final    Comment: (NOTE) The GeneXpert MRSA Assay (FDA approved for NASAL specimens only), is one component of a comprehensive MRSA colonization surveillance program. It is not intended to diagnose MRSA infection nor to guide or monitor treatment for MRSA infections. Test performance is not FDA approved in patients less than 62 years old. Performed at [facility], [address] [place], [place] [address]   Culture, blood (Routine X 2) w Reflex to ID Panel     Status: None   Collection Time: 06/12/22  12:36 PM   Specimen: BLOOD RIGHT HAND  Result Value Ref Range Status   Specimen  Description BLOOD RIGHT HAND  Final   Special Requests   Final    BOTTLES DRAWN AEROBIC AND ANAEROBIC Blood Culture adequate volume   Culture   Final    NO GROWTH 5 DAYS Performed at [facility], [address], [place], [place] [address]    Report Status 06/17/2022 FINAL  Final     Radiology Studies: No results found.  Scheduled Meds:  (feeding supplement) PROSource Plus  30 mL Oral TID BM   apixaban  2.5 mg Oral BID   vitamin C  500 mg Oral BID   atorvastatin  40 mg Oral Daily   Chlorhexidine Gluconate Cloth  6 each Topical Daily   DULoxetine  40 mg Oral BID   epoetin (EPOGEN/PROCRIT) injection  4,000 Units Intravenous Q M,W,F-HD   midodrine  10 mg Oral TID WC   multivitamin  1 tablet Oral QHS   pantoprazole (PROTONIX) IV  40 mg Intravenous Q24H   polyethylene glycol  17 g Oral Daily   senna-docusate  1 tablet Oral QHS   Continuous Infusions:  levETIRAcetam Stopped (06/17/22 2117)   levETIRAcetam Stopped (06/18/22 1958)   piperacillin-tazobactam (ZOSYN)  IV 2.25 g (06/19/22 0631)     LOS: 42 days   Shelly Coss, MD [facility] P9/02/2022, 1:36 PM

## 2022-06-19 NOTE — Evaluation (Signed)
Clinical/Bedside Swallow Evaluation Patient Details  Name: Ernest [name] MRN: [id number] Date of Birth: 12-21-1945  Today's Date: 06/19/2022 Time: SLP Start Time (ACUTE ONLY): 8938 SLP Stop Time (ACUTE ONLY): 1445 SLP Time Calculation (min) (ACUTE ONLY): 50 min  Past Medical History:  Past Medical History:  Diagnosis Date   Abnormal stress test    s/p cath November 2013 with modest disease involving the ostial left main, proximal LAD, proximal RCA - do not appear to be hemodynamically signficant; mild LV dysfunction   Anemia    low iron   Anxiety    Arthritis    Bacteremia    Chronic systolic CHF (congestive heart failure) (HCC)    COPD (chronic obstructive pulmonary disease) ([place])    Coronary artery disease    a. per cath report 2013, b. 09/13/2017-CABG X4 & Mitral valve repair   Depression    DVT (deep venous thrombosis) (HCC)    arm - right- finished blood thinner- not sure when it   Ejection fraction < 50%    Erectile dysfunction    penile implant   ESRD (end stage renal disease) ([place])    [facility] on a MWF schedule.      Gout    Hx: of   Hepatitis C    History of seizures    last one 2018   Hyperlipidemia    Hypertension    Mitral valve disease    s/p Mitral repair 09/13/17   Obesity    Retinopathy    Shortness of breath    Hx: of with exertion   Stroke [facility])    "several" left side weakness, speech affected   Type II or unspecified type diabetes mellitus without mention of complication, not stated as uncontrolled    adult onset   Past Surgical History:  Past Surgical History:  Procedure Laterality Date   A/V FISTULAGRAM N/A 10/04/2017   Procedure: A/V FISTULAGRAM;  Surgeon: Elam Dutch, MD;  Location: [place] CV LAB;  Service: Cardiovascular;  Laterality: N/A;   A/V SHUNTOGRAM Left 07/12/2020   Procedure: A/V SHUNTOGRAM;  Surgeon: Serafina Mitchell, MD;  Location: [place] [facility] LAB;  Service: Cardiovascular;  Laterality: Left;   AMPUTATION Right  07/08/2021   Procedure: AMPUTATION ABOVE KNEE;  Surgeon: Elmore Guise, MD;  Location: [facility] ORS;  Service: Vascular;  Laterality: Right;   AMPUTATION Left 06/08/2022   Procedure: AMPUTATION ABOVE KNEE;  Surgeon: Algernon Huxley, MD;  Location: [facility] ORS;  Service: Vascular;  Laterality: Left;   ANGIOPLASTY Left 09/24/2019   Procedure: Angioplasty Innominate Vien;  Surgeon: Serafina Mitchell, MD;  Location: [place];  Service: Vascular;  Laterality: Left;   AV FISTULA PLACEMENT Left 01/13/2013   Procedure: INSERTION OF ARTERIOVENOUS (AV) GORE-TEX GRAFT ARM;  Surgeon: Angelia Mould, MD;  Location: [place];  Service: Vascular;  Laterality: Left;   AV FISTULA PLACEMENT Left 05/05/2013   Procedure: INSERTION OF LEFT UPPER ARM  ARTERIOVENOUS GORTEX GRAFT;  Surgeon: Angelia Mould, MD;  Location: [place];  Service: Vascular;  Laterality: Left;   AV FISTULA PLACEMENT Left 11/26/2019   Procedure: INSERTION OF ARTERIOVENOUS (AV) GORE-TEX GRAFT, left  ARM;  Surgeon: Serafina Mitchell, MD;  Location: [place];  Service: Vascular;  Laterality: Left;   [place] REMOVAL Left 03/26/2013   Procedure: REMOVAL OF  NON INCORPORATED ARTERIOVENOUS GORETEX GRAFT ([place]) left arm * repair of  left brachial artery with vein patch angioplasty.;  Surgeon: Angelia Mould, MD;  Location: [facility]  OR;  Service: Vascular;  Laterality: Left;   CARDIAC CATHETERIZATION  August 26, 2012   CATARACT EXTRACTION W/ INTRAOCULAR LENS  IMPLANT, BILATERAL     COLONOSCOPY     CORONARY ARTERY BYPASS GRAFT N/A 09/13/2017   Procedure: CORONARY ARTERY BYPASS GRAFTING (CABG) times four LIMA  to LAD, SVG sequentially to OM and RAMUS Intermediate and SVG to Acute Marginal;  Surgeon: Melrose Nakayama, MD;  Location: [place];  Service: Open Heart Surgery;  Laterality: N/A;   DIALYSIS/PERMA CATHETER INSERTION N/A 02/28/2021   Procedure: DIALYSIS/PERMA CATHETER EXCHANGE;  Surgeon: Katha Cabal, MD;  Location: [place] CV LAB;  Service:  Cardiovascular;  Laterality: N/A;   EMBOLECTOMY Right 08/27/2013   Procedure: EMBOLECTOMY;  Surgeon: Mal Misty, MD;  Location: [place];  Service: Vascular;  Laterality: Right;  Thrombectomy of Radial and ulnar artery.   ENDOVASCULAR STENT INSERTION Left 09/24/2019   Procedure: Endovascular Stent Graft Insertion, Arm Graft;  Surgeon: Serafina Mitchell, MD;  Location: [facility] OR;  Service: Vascular;  Laterality: Left;   EYE SURGERY     laser.  and surgery for DM   fistula     RUE and wrist   FISTULOGRAM Left 09/24/2019   Procedure: SHUNTOGRAM OF ARTERIOVENOUS FISTULA LEFT ARM,;  Surgeon: Serafina Mitchell, MD;  Location: [place];  Service: Vascular;  Laterality: Left;   INSERTION OF DIALYSIS CATHETER Right 01/01/2013   Procedure: INSERTION OF DIALYSIS CATHETER right  internal jugular;  Surgeon: Serafina Mitchell, MD;  Location: [place];  Service: Vascular;  Laterality: Right;   INSERTION OF DIALYSIS CATHETER N/A 03/26/2013   Procedure: INSERTION OF DIALYSIS CATHETER Left internal jugular vein;  Surgeon: Angelia Mould, MD;  Location: [place];  Service: Vascular;  Laterality: N/A;   INSERTION OF DIALYSIS CATHETER Right 11/26/2019   Procedure: INSERTION OF DIALYSIS CATHETER, right internal jugular;  Surgeon: Serafina Mitchell, MD;  Location: [place];  Service: Vascular;  Laterality: Right;   IR THORACENTESIS ASP PLEURAL SPACE W/IMG GUIDE  09/30/2019   LEFT HEART CATH AND CORONARY ANGIOGRAPHY N/A 09/09/2017   Procedure: LEFT HEART CATH AND CORONARY ANGIOGRAPHY;  Surgeon: Troy Sine, MD;  Location: [place] CV LAB;  Service: Cardiovascular;  Laterality: N/A;   LIGATION OF ARTERIOVENOUS  FISTULA Right 12/30/2013   Procedure: REMOVAL OF SEGMENT OF GORTEX GRAFT AND FISTULA  AND REPAIR OF BRACHIAL ARTERY;  Surgeon: Mal Misty, MD;  Location: [place];  Service: Vascular;  Laterality: Right;   LOWER EXTREMITY ANGIOGRAPHY N/A 06/28/2021   Procedure: Lower Extremity Angiography;  Surgeon: Katha Cabal,  MD;  Location: [place] CV LAB;  Service: Cardiovascular;  Laterality: N/A;   LOWER EXTREMITY ANGIOGRAPHY Left 11/23/2021   Procedure: LOWER EXTREMITY ANGIOGRAPHY;  Surgeon: Algernon Huxley, MD;  Location: [place] CV LAB;  Service: Cardiovascular;  Laterality: Left;   MITRAL VALVE REPAIR N/A 09/13/2017   Procedure: MITRAL VALVE REPAIR (MVR);  Surgeon: Melrose Nakayama, MD;  Location: [place];  Service: Open Heart Surgery;  Laterality: N/A;   MITRAL VALVE REPAIR (MV)/CORONARY ARTERY BYPASS GRAFTING (CABG)  09/13/2017   PENILE PROSTHESIS IMPLANT     1997.. no card   PERIPHERAL VASCULAR BALLOON ANGIOPLASTY Left 10/04/2017   Procedure: PERIPHERAL VASCULAR BALLOON ANGIOPLASTY;  Surgeon: Elam Dutch, MD;  Location: [place] CV LAB;  Service: Cardiovascular;  Laterality: Left;  AVF   PERIPHERAL VASCULAR BALLOON ANGIOPLASTY Left 07/12/2020   Procedure: PERIPHERAL VASCULAR BALLOON ANGIOPLASTY;  Surgeon: Trula Slade,  Butch Penny, MD;  Location: [place] CV LAB;  Service: Cardiovascular;  Laterality: Left;  ARM SHUNTOGRAM   REVISION OF ARTERIOVENOUS GORETEX GRAFT Left 09/24/2019   Procedure: REVISION OF ARTERIOVENOUS GORETEX GRAFT LEFT ARM;  Surgeon: Serafina Mitchell, MD;  [facility] OR;  Service: Vascular;  Laterality: Left;   TEE WITHOUT CARDIOVERSION N/A 06/11/2013   Procedure: TRANSESOPHAGEAL ECHOCARDIOGRAM (TEE);  Surgeon: Larey Dresser, MD;  Location: [facility] ENDOSCOPY;  Service: Cardiovascular;  Laterality: N/A;   TEE WITHOUT CARDIOVERSION N/A 09/13/2017   Procedure: TRANSESOPHAGEAL ECHOCARDIOGRAM (TEE);  Surgeon: Melrose Nakayama, MD;  Location: [place];  Service: Open Heart Surgery;  Laterality: N/A;   THROMBECTOMY W/ EMBOLECTOMY Left 03/26/2013   Procedure: Attempted thrombectomy of left arm arteriovenous goretex graft.;  Surgeon: Angelia Mould, MD;  Location: [facility] OR;  Service: Vascular;  Laterality: Left;   ULTRASOUND GUIDANCE FOR VASCULAR ACCESS Bilateral 09/24/2019    Procedure: Ultrasound Guidance For Vascular Access, Right Femoral Vein and Left Arm Graft;  Surgeon: Serafina Mitchell, MD;  Location: [facility] OR;  Service: Vascular;  Laterality: Bilateral;   VENOGRAM N/A 04/07/2013   Procedure: VENOGRAM;  Surgeon: Serafina Mitchell, MD;  Location: [facility] CATH LAB;  Service: Cardiovascular;  Laterality: N/A;   HPI:  Pt is a 76 year old male with history of Multiple medical issues including deafness, hypertension, hyperlipidemia, diabetes type 2, COPD, CHF with EF of 20 to 25%, coronary disease status post CABG, peripheral artery disease status post right AKA, atrial flutter on Eliquis, history of CVA, Dementia, seizure disorder, ESRD on dialysis who presented here from [place] on 8/19 for the evaluation of left foot wound.  Vascular surgery was consulted for admission and he underwent left AKA.  On 8/29.  Patient became hypotensive and started on pressures patient was weaned off on 8/30.  Hospital course  remarkable for persistent hypotension, encephalopathy impacting his swalloowing at meals per NSG report(coughing, oral pocketing).  Palliative care was consulted for goals of care.  Patient moved out of ICU on 9/3.  Due to poor prognosis, Palliative Care discussing with family about comfort care discussion.    Assessment / Plan / Recommendation  Clinical Impression   Pt appears to present w/ oropharyngeal phase dysphagia in setting of declined Cognitive status; Baseline Dementia and declined Medical status/illness. ANY Cognitive decline can impact overall awareness/timing of swallow and safety during po tasks which increases risk for aspiration, choking. Pt's risk for aspiration can be reduced when following aspiration precautions and using a modified diet consistency w/ feeding support and Supervision w/ oral intake. He required Mod verbal/visual cues for follow through during po tasks, and he is missing Upper dentition/some lower dentition. Full feeding  assistance required as pt is unable to feed himself.    Pt consumed trials of ice chips, purees, thin and Nectar liquids via cup w/ inconsistent, overt clinical s/s of aspiration noted moreso w/ thin liquids; less w/ Nectar liquids. An immediate/delayed cough noted moreso w/ thin liquids; no decline in respiratory status during/post trials nor decline in O2 sats w/ trials(94%). Cough effort appeared weak-fair; pt is sedentary in bed.  Oral phase was c/b min bolus management and oral clearing of the boluses given. Time given for f/u dry swallowing and full oral clearing b/t trials. Suspect slower oral phase impacted by Baseline Cognitive decline.  Cursory OM Exam revealed generalized oral weakness but no overt unilateral weakness noted. Confusion of OM tasks and w/ oral care noted.  In setting of baseline Dementia and Cognitive decline, poor Dentition status, illness, and risk for aspiration, recommend initiation of the dysphagia level 1(PUREED foods moistened for ease of oral phase/mastication) w/ NECTAR liquids via TSP/CUP; aspiration precautions; reduce Distractions during meals and engage pt during meals. Pills Crushed in Puree for safer swallowing as needed. Support and Supervision w/ feeding at meals. MD/NSG updated. ST services can be available if any further needs or education while admitted. ST services recommends follow w/ Palliative Care for [place] and education re: impact of Cognitive decline/Dementia on swallowing; Dysphagia. Precautions posted in room. ADDENDUM: pt has transitioned to Comfort Care status post Palliative Care meeting this PM. NSG agreed. SLP Visit Diagnosis: Dysphagia, oropharyngeal phase (R13.12) (baseline Dementia; illness)    Aspiration Risk  Moderate aspiration risk;Risk for inadequate nutrition/hydration    Diet Recommendation   dysphagia level 1(PUREED foods moistened for ease of oral phase/mastication) w/ NECTAR liquids via TSP/CUP; aspiration precautions;  reduce Distractions during meals and engage pt during meals. Support and Supervision w/ feeding at meals.  Medication Administration: Crushed with puree    Other  Recommendations Recommended Consults:  (Palliative Care following; Dietician f/u) Oral Care Recommendations: Oral care BID;Oral care before and after PO;Staff/trained caregiver to provide oral care Other Recommendations: Order thickener from pharmacy;Prohibited food (jello, ice cream, thin soups);Remove water pitcher;Have oral suction available    Recommendations for follow up therapy are one component of a multi-disciplinary discharge planning process, led by the attending physician.  Recommendations may be updated based on patient status, additional functional criteria and insurance authorization.  Follow up Recommendations No SLP follow up - transitioned to comfort care status per [place] Recommended at Discharge Frequent or constant Supervision/Assistance  Functional Status Assessment Patient has had a recent decline in their functional status and/or demonstrates limited ability to make significant improvements in function in a reasonable and predictable amount of time  Frequency and Duration  (n/a)   (n/a)       Prognosis Prognosis for Safe Diet Advancement: Guarded Barriers to Reach Goals: Cognitive deficits;Language deficits;Time post onset;Severity of deficits Barriers/Prognosis Comment: pt has transitioned to comfort care per Palliative Care note/NSG report      Swallow Study   General Date of Onset: 06/02/22 HPI: Pt is a 76 year old male with history of deafness, hypertension, hyperlipidemia, diabetes type 2, COPD, CHF with EF of 20 to 25%, coronary disease status post CABG, peripheral artery disease status post right AKA, atrial flutter on Eliquis, history of CVA, Dementia, seizure disorder, ESRD on dialysis who presented here from [place] on 8/19 for the evaluation of left foot  wound.  Vascular surgery was consulted for admission and he underwent left AKA.  On 8/29.  Patient became hypotensive and started on pressures patient was weaned off on 8/30.  Hospital course  remarkable for persistent hypotension, encephalopathy impacting his swalloowing at meals per NSG report(coughing, oral pocketing).  Palliative care was consulted for goals of care.  Patient moved out of ICU on 9/3.  Due to poor prognosis, palliative care discussing with family about comfort care discussion. Type of Study: Bedside Swallow Evaluation Previous Swallow Assessment: 09/2021 Diet Prior to this Study: NPO (Regular diet earlier during this admit) Temperature Spikes Noted: No (wbc 14.5) Respiratory Status: Nasal cannula (2L) History of Recent Intubation: No Behavior/Cognition: Alert;Cooperative;Pleasant mood;Confused;Distractible;Requires cueing Oral Cavity Assessment:  (patchy debris on tongue - Thrush?) Oral Care Completed by SLP: Yes Oral Cavity - Dentition: Poor condition;Missing dentition (  no top Dentution) Vision:  (n/a) Self-Feeding Abilities: Total assist Patient Positioning: Upright in bed (full assistance needed for positioning upright) Baseline Vocal Quality: Low vocal intensity (mumbled words) Volitional Cough: Cognitively unable to elicit Volitional Swallow: Unable to elicit    Oral/Motor/Sensory Function Overall Oral Motor/Sensory Function: Generalized oral weakness (not unilateral in appearance)   Ice Chips Ice chips: Impaired Presentation: Spoon (fed; 3 trials) Oral Phase Impairments: Poor awareness of bolus;Reduced lingual movement/coordination Oral Phase Functional Implications: Prolonged oral transit Pharyngeal Phase Impairments:  (none)   Thin Liquid Thin Liquid: Impaired Presentation: Cup (fed; 3 trials) Oral Phase Impairments: Poor awareness of bolus Pharyngeal  Phase Impairments: Cough - Immediate;Cough - Delayed (x2/3 trials)    Nectar Thick Nectar Thick Liquid:  Impaired Presentation: Cup (fed; 9 trials) Oral Phase Impairments: Poor awareness of bolus Oral phase functional implications: Prolonged oral transit (x2) Pharyngeal Phase Impairments: Multiple swallows;Cough - Immediate (x1)   Honey Thick Honey Thick Liquid: Not tested   Puree Puree: Impaired Presentation: Spoon (fed; 2 trials accpeted) Oral Phase Impairments: Poor awareness of bolus;Reduced lingual movement/coordination Oral Phase Functional Implications: Prolonged oral transit Pharyngeal Phase Impairments:  (none)   Solid     Solid: Not tested        Orinda Kenner, MS, CCC-SLP Speech Language Pathologist Rehab Services; [place] [phone] (ascom) Marjorie Lussier 06/19/2022,5:13 PM

## 2022-06-19 NOTE — Progress Notes (Addendum)
1725 Report given to [facility] RN. Questions answered. 1745 Transferred to room 112 via bed.

## 2022-06-20 DIAGNOSIS — F4329 Adjustment disorder with other symptoms: Secondary | ICD-10-CM | POA: Diagnosis not present

## 2022-06-20 MED ORDER — LEVETIRACETAM IN NACL 500 MG/100ML IV SOLN
500.0000 mg | Freq: Two times a day (BID) | INTRAVENOUS | Status: DC
  Administered 2022-06-20 (×2): 500 mg via INTRAVENOUS
  Filled 2022-06-20 (×3): qty 100

## 2022-06-20 NOTE — Progress Notes (Signed)
Progress Note   Patient: Ernest [name] KVQ:[id number] DOB: 1946-09-17 DOA: 06/02/2022     18 DOS: the patient was seen and examined on 06/20/2022   Brief hospital course: 76 year old man with chronic systolic congestive heart failure, COPD, end-stage renal disease, hepatitis C, hypertension, stroke.  Patient brought in by family secondary to gangrene left foot.  Started empirically on antibiotics.  On 06/04/2022 patient refused dialysis and refused medications.  Patient declined amputation at this time.  Appreciate palliative care consultation and patient was made a DO NOT RESUSCITATE. Patient is seen by palliative care for poor prognosis.  8/23.  Patient was agreeable and dialyzed today. 8/25.  Left AKA 8/29: Patient was hypotensive requiring transfer to ICU for pressors 8/30: Weaned off pressor, DNR, troponin trending up 8/31: Daughter is not ready for comfort care/hospice yet and wants aggressive management 9/2: Still in stepdown, no significant change. May need second agent for hospital associated aspiration.   9/5: GOC discussed with palliative, family.  Transitioned to comfort care.  No further dialysis  9/6: approved for residential hospice, awaiting bed and family consent for d/c  Assessment and Plan:  Comfort measures only status - as of 9/5 afternoon. Pt expressed desire to be kept comfortable, no longer wants dialysis Approved for residential hospice, bed pending. Continue comfort measures per orders. Notify provider if any signs of pain, distress, discomfort despite ordered medications. Most chronic medications discontinued unless they contribute to patient's comfort.  =============================================================  Persistent hypotension: Patient has low cardiac output due to severe congestive heart failure.   Transferred to stepdown on 9/4.  Currently on midodrine.  Blood pressure better. Zosyn was started on 9/1 for suspicion of sepsis but blood cultures  have been negative, he is afebrile.  Antibiotic will be discontinued.  He has mild leukocytosis   Acute on chronic systolic congestive heart failure: EF of 20 to 25%.   Cardiology was consulted.  No plan for further ischemic work-up.     NSTEMI: No chest pain or dyspnea.  Troponin peaked at 2114.  No plan for further ischemic work-up.   Aspirin, Eliquis, Lipitor were stopped.   Peripheral artery disease: Status post bilateral AKA.  Eliquis was stopped.   Acute hypoxic respiratory failure/COPD: Currently on 2 L of oxygen per minute.   ESRD on dialysis: Dialyzed on Monday, Wednesday, Friday, nephrology following.  Could not undergo dialysis on 9/4 because of low blood pressure.  Now comfort care.   Vascular dementia: Continue delirium precautions.  Haldol for severe agitation.  Frequent reorientation.   Stage II sacral ulcer: Continue local wound care   Anemia of chronic disease: Secondary to ESRD.  Continue to monitor hemoglobin.   Seizure disorder: On Keppra   Atrial flutter: currently rate is controlled.   Stopped Eliquis for anticoagulation.   Not a candidate for beta-blocker due to soft blood pressure.   Severe protein calorie malnutrition: Continue supportive care, nutritionist following.Since he was pocketing food on mouth, speech therapy consulted. On dysphagia diet.   Dry gangrene of left foot: Now status post AKA.  Vascular surgery was following.   Goals of care: Multiple comorbidities.  Very poor prognosis.  Currently declining, hypotensive, encephalopathic.  Palliative care following for goals of care/comfort care recommendation. CODE STATUS DNR.      Subjective: Pt was sleeping comfortably, only minimally responsive to verbal and sternal rub.  No acute events reported.  No family present during my encounter.   Physical Exam: Vitals:   06/19/22 1600 06/19/22 1821 06/20/22  0375 06/20/22 1247  BP: 124/72 130/71 (!) 81/38 123/67  Pulse: (!) 101 100 (!) 104 (!) 102   Resp: 15 20 16    Temp: 97.8 F (36.6 C) 97.8 F (36.6 C) 98.3 F (36.8 C)   TempSrc:      SpO2: 97% 96% 93%   Weight:      Height:       General exam: sleeping, appears comfortable, minimally responsive, no acute distress Respiratory system: CTAB, no wheezes, rales or rhonchi, normal respiratory effort. Cardiovascular system: normal S1/S2, RRR  Gastrointestinal system: soft, NT, ND Central nervous system: exam limited by somnolence, minimally responsive Extremities: AKA, no edema, normal tone Skin: dry, intact, normal temperature   Data Reviewed:  No new labs today, on comfort care  Family Communication: none at bedside, palliative and hospice have updated daughter.  Will attempt to call but no new medical updates to provide.  Pt stable for d/c to hospice and remains appropriate.  Disposition: Status is: Inpatient Remains inpatient appropriate because: awaiting hospice bed   Planned Discharge Destination:  Hospice    Time spent: 35 minutes  Author: Ezekiel Slocumb, DO 06/20/2022 4:06 PM  For on call review [email or web address].

## 2022-06-20 NOTE — Progress Notes (Addendum)
Nutrition Brief Note  Chart reviewed. Pt and daughter desire residential hospice placement. Focus of care on comfort. Plan for hospice evaluation today.  No further nutrition interventions planned at this time.  Please re-consult as needed.   Loistine Chance, RD, LDN, [place] Registered Dietitian II Certified Diabetes Care and Education Specialist Please refer to [facility] for RD and/or RD on-call/weekend/after hours pager

## 2022-06-20 NOTE — Progress Notes (Addendum)
Manufacturing engineer [facility]) Hospital Liaison Note  Received request from PMT provider/Crystal G., NP for family interest in [place]. Visited patient at bedside and spoke with daughter/Heather to confirm interest and explain services.Ernest [name] is receptive to evaluation but prefers to discus details at a later time.   Approval for Hospice Home is determined by [facility] MD. Once [facility] MD has determined Hospice Home eligibility, [place] will update hospital staff and family. Patient approved for Hospice Home.   Unfortunately, there are no beds available for transfer today.   Addendum: Daughter contacted @ 1404 to offer a bed & further explain services. No answer vm left requesting call me returned. Toc aware  Please do not hesitate to call with any hospice related questions.    Thank you for the opportunity to participate in this patient's care.  Daphene Calamity, MSW [facility] Liaison  [phone]

## 2022-06-20 NOTE — TOC Progression Note (Signed)
Transition of Care [facility]) - Progression Note    Patient Details  Name: Ernest [name] MRN: [id number] Date of Birth: 24-May-1946  Transition of Care [facility]) CM/SW Contact  Laurena Slimmer, RN Phone Number: 06/20/2022, 10:02 AM  Clinical Narrative:    Disposition pending hospice evaluation.    Expected Discharge Plan: [place] (with hospice) Barriers to Discharge: Continued Medical Work up  Expected Discharge Plan and Services Expected Discharge Plan: [place] (with hospice)     Post Acute Care Choice: [place] Living arrangements for the past 2 months: [place]                                       Social Determinants of Health (SDOH) Interventions    Readmission Risk Interventions    06/10/2022   12:03 PM 06/28/2021   10:38 AM  Readmission Risk Prevention Plan  Transportation Screening Complete Complete  Medication Review (RN Care Manager) Complete Complete  PCP or Specialist appointment within 3-5 days of discharge Complete Complete  HRI or Home Care Consult Complete Complete  SW Recovery Care/Counseling Consult Complete Complete  Palliative Care Screening Not Applicable Not Applicable  Skilled Nursing Facility Complete Complete

## 2022-06-21 DIAGNOSIS — F4329 Adjustment disorder with other symptoms: Secondary | ICD-10-CM | POA: Diagnosis not present

## 2022-06-21 MED ORDER — LEVETIRACETAM 500 MG PO TABS: 500.0000 mg | ORAL_TABLET | Freq: Two times a day (BID) | ORAL | Status: AC

## 2022-06-21 MED ORDER — HYDROMORPHONE HCL 1 MG/ML IJ SOLN: 0.5000 mg | INTRAMUSCULAR | 0 refills | Status: AC | PRN

## 2022-06-21 MED ORDER — ONDANSETRON HCL 4 MG PO TABS: 4.0000 mg | ORAL_TABLET | Freq: Four times a day (QID) | ORAL | 0 refills | Status: AC | PRN

## 2022-06-21 MED ORDER — MIDODRINE HCL 10 MG PO TABS
10.0000 mg | ORAL_TABLET | Freq: Three times a day (TID) | ORAL | Status: AC
Start: 2022-06-21 — End: ?

## 2022-06-21 MED ORDER — PANTOPRAZOLE SODIUM 40 MG PO TBEC
40.0000 mg | DELAYED_RELEASE_TABLET | Freq: Every day | ORAL | Status: AC
Start: 2022-06-22 — End: ?

## 2022-06-21 MED ORDER — PANTOPRAZOLE SODIUM 40 MG IV SOLR
40.0000 mg | Freq: Every day | INTRAVENOUS | Status: DC
  Administered 2022-06-21: 40 mg via INTRAVENOUS
  Filled 2022-06-21: qty 10

## 2022-06-21 MED ORDER — HALOPERIDOL LACTATE 5 MG/ML IJ SOLN: 1.0000 mg | Freq: Four times a day (QID) | INTRAMUSCULAR | Status: AC | PRN

## 2022-06-21 MED ORDER — LEVETIRACETAM IN NACL 500 MG/100ML IV SOLN
500.0000 mg | Freq: Two times a day (BID) | INTRAVENOUS | Status: DC
  Administered 2022-06-21: 500 mg via INTRAVENOUS
  Filled 2022-06-21 (×2): qty 100

## 2022-06-21 MED ORDER — PANTOPRAZOLE SODIUM 40 MG PO TBEC
40.0000 mg | DELAYED_RELEASE_TABLET | Freq: Every day | ORAL | Status: DC
  Administered 2022-06-22: 40 mg via ORAL
  Filled 2022-06-21: qty 1

## 2022-06-21 MED ORDER — LEVETIRACETAM 500 MG PO TABS
500.0000 mg | ORAL_TABLET | Freq: Two times a day (BID) | ORAL | Status: DC
  Administered 2022-06-21 – 2022-06-22 (×2): 500 mg via ORAL
  Filled 2022-06-21 (×2): qty 1

## 2022-06-21 MED ORDER — OXYCODONE-ACETAMINOPHEN 5-325 MG PO TABS: 1.0000 | ORAL_TABLET | ORAL | 0 refills | Status: AC | PRN

## 2022-06-21 NOTE — Progress Notes (Signed)
Manufacturing engineer [facility]) Hospital Liaison Note   Daughter/Heather contacted to offer a bed & further explain services. Heather voiced understanding of services; however, she reports that before making a decision she would like to speak to a provider first. [name] reports that she will contact MSW later this PM or tomorrow morning.   MSW reiterates that a bed is not guaranteed to be available at a later time, but [facility] staff will continue to follow. Heather voiced understanding and call concludes.  MSW sent secure chat to provider & TOC providing above updates and sharing Heather's request to speak to MD.    Please do not hesitate to call with any hospice related questions.    Thank you for the opportunity to participate in this patient's care.   Daphene Calamity, MSW [facility] Liaison  [phone]

## 2022-06-21 NOTE — Discharge Summary (Addendum)
Physician Discharge Summary   Patient: Ernest [name] MRN: [id number] DOB: April 08, 1946  Admit date:     06/02/2022  Discharge date: 06/22/2022  Discharge Physician: Ezekiel Slocumb   PCP: Brayton Mars, MD   Recommendations at discharge:    Follow up with hospice team upon arrival  Discharge Diagnoses: Principal Problem:   Adjustment reaction with aggression Active Problems:   Dry gangrene ([place])   End-stage renal disease on hemodialysis ([place])   Vascular dementia ([place])   Pressure injury of skin   Secondary hyperparathyroidism of renal origin ([place])   Chronic HFrEF (heart failure with reduced ejection fraction) ([place])   History of CVA (cerebrovascular accident)   Seizure, late effect of stroke ([place])   Essential hypertension   Elevated troponin   Atrial flutter (HCC)   Anemia of chronic disease   Type 2 diabetes mellitus with diabetic peripheral angiopathy without gangrene ([place])   Atherosclerosis of native arteries of the extremities with gangrene ([place])   Septic shock (HCC)   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 *  Hospital Course: 76 year old man with chronic systolic congestive heart failure, COPD, end-stage renal disease, hepatitis C, hypertension, stroke.  Patient brought in by family secondary to gangrene left foot.  Started empirically on antibiotics.  On 06/04/2022 patient refused dialysis and refused medications.  Patient declined amputation at this time.  Appreciate palliative care consultation and patient was made a DO NOT RESUSCITATE. Patient is seen by palliative care for poor prognosis.  8/23.  Patient was agreeable and dialyzed today. 8/25.  Left AKA 8/29: Patient was hypotensive requiring transfer to ICU for pressors 8/30: Weaned off pressor, DNR, troponin trending up 8/31: Daughter is not ready for comfort care/hospice yet and wants  aggressive management 9/2: Still in stepdown, no significant change. May need second agent for hospital associated aspiration.   9/5: GOC discussed with palliative, family.  Transitioned to comfort care.  No further dialysis  9/6: approved for residential hospice, awaiting bed and family consent for d/c   Patient remains stable for discharge to hospice facility. Attempted to reach daughter by phone as requested, but no answer on three attempts.     Assessment and Plan: Dry gangrene ([place]) Gangrene left foot.  Status post left AKA on 8/25.   End-stage renal disease on hemodialysis ([place]) HD per nephro  Vascular dementia ([place]) More stable for now he does get intermittent agitation.  Pressure injury of skin Wound care consultation.  Stage II gluteal cleft.  Secondary hyperparathyroidism of renal origin [facility]) Dialysis patient.  Protein-calorie malnutrition, severe Complicates overall prognosis.  Hypokalemia Replaced on admission I will not replace any further being that the patient is a dialysis patient.  Impaired fasting glucose Hemoglobin A1c  7.0 consistent with diagnosis of diabetes.  Mgmt as outlined.  Septic shock (HCC) Persistent hypotension yesterday requiring transfer to ICU/stepdown.  He is off pressors now this is likely due to aspiration pneumonia.[name]   blood pressures are stable.  Continue IV Zosyn  Anemia of chronic disease Hemoglobin 11.1-> 7.1->9.2 Unsure if this acute drop is due to recent procedure/surgery related blood loss Status post 1 PRBC transfusion for hemoglobin of 7.1 yesterday  Atrial flutter (HCC) On heparin drip.  Rate controlled  Elevated troponin Likely due to demand ischemia from severe hypotension, tachycardia transient V. tach.  Troponin peaked 2100 and now trending down Echo showed EF dropped from  50-> 20 to 25% with global hypokinesis -Cardiology following and appreciate their input -Not a good candidate for ischemic evaluation at  this current time -Heparin drip for now and hold Eliquis due to anemia     Seizure, late effect of stroke ([place]) On Keppra.  Chronic HFrEF (heart failure with reduced ejection fraction) (HCC) Well compensated.  Cardiology following, echo shows EF of 20 to 25% which is a drop from before when it was 50%   Type 2 Diabetes - Hbg A1c 7.0%. complicated by neuropathy.         Consultants: Nephrology, Palliative Care, Cardiology,Vascular surgery  Procedures performed: Left AKA, Hemodialysis   Disposition: Hospice care  Diet recommendation:  Per patient comfort   DISCHARGE MEDICATION: Allergies as of 06/22/2022   [name]      Medication List     STOP taking these medications    acetaminophen 500 MG tablet Commonly known as: TYLENOL   amLODipine 5 MG tablet Commonly known as: NORVASC   apixaban 5 MG Tabs tablet Commonly known as: ELIQUIS   atorvastatin 40 MG tablet Commonly known as: LIPITOR   DULoxetine 20 MG capsule Commonly known as: CYMBALTA   mirtazapine 7.5 MG tablet Commonly known as: REMERON   multivitamin Tabs tablet   polyethylene glycol 17 g packet Commonly known as: MIRALAX / GLYCOLAX   senna-docusate 8.6-50 MG tablet Commonly known as: Senokot-S   sevelamer carbonate 800 MG tablet Commonly known as: RENVELA       TAKE these medications    haloperidol lactate 5 MG/ML injection Commonly known as: HALDOL Inject 0.2 mLs (1 mg total) into the vein every 6 (six) hours as needed.   HYDROmorphone 1 MG/ML injection Commonly known as: DILAUDID Inject 0.5 mLs (0.5 mg total) into the vein every 4 (four) hours as needed for severe pain.   levETIRAcetam 500 MG tablet Commonly known as: KEPPRA Take 1 tablet (500 mg total) by mouth every 12 (twelve) hours. What changed:  when to take this additional instructions Another medication with the same name was removed. Continue taking this medication, and follow the directions you see  here.   midodrine 10 MG tablet Commonly known as: PROAMATINE Take 1 tablet (10 mg total) by mouth 3 (three) times daily with meals.   ondansetron 4 MG tablet Commonly known as: ZOFRAN Take 1 tablet (4 mg total) by mouth every 6 (six) hours as needed for nausea.   oxyCODONE-acetaminophen 5-325 MG tablet Commonly known as: PERCOCET/ROXICET Take 1 tablet by mouth every 4 (four) hours as needed for moderate pain.   pantoprazole 40 MG tablet Commonly known as: PROTONIX Take 1 tablet (40 mg total) by mouth daily. What changed: when to take this               Discharge Care Instructions  (From admission, onward)           Start     Ordered   06/21/22 0000  Discharge wound care:       Comments: As above   06/21/22 1201            Follow-up Information     Kris Hartmann, NP Follow up in 3 week(s).   Specialty: Vascular Surgery Why: For wound re-check Contact information: [place] [address] [phone]                Discharge Exam: Filed Weights   06/06/22 1230 06/17/22 1556 06/18/22 1620  Weight: 55.9 kg 57.7  kg 58.5 kg   General exam: sleeping comfortably, unresponsive, no acute distress Respiratory system: on room air, normal respiratory effort. Cardiovascular system: normal S1/S2, RRR Gastrointestinal system: soft, NT, ND Central nervous system: exam limited due to somnolence, unresponsive  Extremities: AKA Skin: dry, intact, normal temperature Psychiatry: unable to assess due to somnolence, unresponsive   Condition at discharge: stable  The results of significant diagnostics from this hospitalization (including imaging, microbiology, ancillary and laboratory) are listed below for reference.   Imaging Studies: ECHOCARDIOGRAM COMPLETE  Result Date: 06/14/2022    ECHOCARDIOGRAM REPORT   Patient Name:   JAHKING LESSER Date of Exam: 06/14/2022 Medical Rec #:  [id number]       Height:       69.0 in Accession #:    [id number]       Weight:       123.2 lb Date of Birth:  25-May-1946       BSA:          1.682 m Patient Age:    76 years        BP:           122/53 mmHg Patient Gender: M               HR:           100 bpm. Exam Location:  [facility] Procedure: 2D Echo, Cardiac Doppler and Color Doppler Indications:     Elevated troponin  History:         Patient has prior history of Echocardiogram examinations, most                  recent 06/27/2021. COPD, Signs/Symptoms:Shortness of Breath;                  Risk Factors:Hypertension and Dyslipidemia. Bacteremia.  Sonographer:     Sherrie Sport Referring Phys:  [place] Diagnosing Phys: Ida Rogue MD IMPRESSIONS  1. Left ventricular ejection fraction, by estimation, is 20 to 25%. The left ventricle has severely decreased function. The left ventricle demonstrates global hypokinesis. Indeterminate diastolic filling due to E-A fusion.  2. Right ventricular systolic function is moderately reduced. The right ventricular size is moderately enlarged. There is mildly elevated pulmonary artery systolic pressure. The estimated right ventricular systolic pressure is 97.0 mmHg.  3. Moderate pleural effusion in the left lateral region.  4. The mitral valve is normal in structure. Mild mitral valve regurgitation. No evidence of mitral stenosis.  5. The aortic valve is normal in structure. Aortic valve regurgitation is not visualized. Aortic valve sclerosis is present, with no evidence of aortic valve stenosis.  6. The inferior vena cava is normal in size with greater than 50% respiratory variability, suggesting right atrial pressure of 3 mmHg. FINDINGS  Left Ventricle: Left ventricular ejection fraction, by estimation, is 20 to 25%. The left ventricle has severely decreased function. The left ventricle demonstrates global hypokinesis. The left ventricular internal cavity size was normal in size. There is no left ventricular hypertrophy. Indeterminate diastolic filling due to E-A fusion. Right  Ventricle: The right ventricular size is moderately enlarged. No increase in right ventricular wall thickness. Right ventricular systolic function is moderately reduced. There is mildly elevated pulmonary artery systolic pressure. The tricuspid regurgitant velocity is 2.99 m/s, and with an assumed right atrial pressure of 5 mmHg, the estimated right ventricular systolic pressure is 26.3 mmHg. Left Atrium: Left atrial size was normal in size. Right Atrium: Right atrial size was normal  in size. Pericardium: There is no evidence of pericardial effusion. Mitral Valve: The mitral valve is normal in structure. Mild mitral valve regurgitation. No evidence of mitral valve stenosis. MV peak gradient, 8.4 mmHg. The mean mitral valve gradient is 5.0 mmHg. Tricuspid Valve: The tricuspid valve is normal in structure. Tricuspid valve regurgitation is mild . No evidence of tricuspid stenosis. Aortic Valve: The aortic valve is normal in structure. Aortic valve regurgitation is not visualized. Aortic valve sclerosis is present, with no evidence of aortic valve stenosis. Aortic valve mean gradient measures 1.0 mmHg. Aortic valve peak gradient measures 2.6 mmHg. Aortic valve area, by VTI measures 1.88 cm. Pulmonic Valve: The pulmonic valve was normal in structure. Pulmonic valve regurgitation is trivial. No evidence of pulmonic stenosis. Aorta: The aortic root is normal in size and structure. Venous: The inferior vena cava is normal in size with greater than 50% respiratory variability, suggesting right atrial pressure of 3 mmHg. IAS/Shunts: No atrial level shunt detected by color flow Doppler. Additional Comments: There is a moderate pleural effusion in the left lateral region.  LEFT VENTRICLE PLAX 2D LVIDd:         5.18 cm      Diastology LVIDs:         4.94 cm      LV e' medial:    7.72 cm/s LV PW:         0.96 cm      LV E/e' medial:  10.7 LV IVS:        1.14 cm      LV e' lateral:   6.96 cm/s LVOT diam:     2.00 cm      LV E/e'  lateral: 11.9 LV SV:         21 LV SV Index:   12 LVOT Area:     3.14 cm  LV Volumes (MOD) LV vol d, MOD A2C: 171.0 ml LV vol d, MOD A4C: 178.0 ml LV vol s, MOD A2C: 130.0 ml LV vol s, MOD A4C: 139.0 ml LV SV MOD A2C:     41.0 ml LV SV MOD A4C:     178.0 ml LV SV MOD BP:      42.0 ml RIGHT VENTRICLE RV Basal diam:  4.87 cm RV S prime:     6.09 cm/s TAPSE (M-mode): 1.3 cm LEFT ATRIUM              Index        RIGHT ATRIUM           Index LA Vol (A2C):   108.0 ml 64.22 ml/m  RA Area:     13.00 cm LA Vol (A4C):   78.4 ml  46.62 ml/m  RA Volume:   34.00 ml  20.22 ml/m LA Biplane Vol: 94.7 ml  56.31 ml/m  AORTIC VALVE                    PULMONIC VALVE AV Area (Vmax):    1.61 cm     PR End Diast Vel: 11.56 msec AV Area (Vmean):   1.73 cm AV Area (VTI):     1.88 cm AV Vmax:           80.35 cm/s AV Vmean:          54.400 cm/s AV VTI:            0.109 m AV Peak Grad:      2.6 mmHg AV Mean Grad:  1.0 mmHg LVOT Vmax:         41.10 cm/s LVOT Vmean:        29.900 cm/s LVOT VTI:          0.065 m LVOT/AV VTI ratio: 0.60  AORTA Ao Root diam: 3.70 cm MITRAL VALVE                TRICUSPID VALVE MV Area (PHT): 7.90 cm     TR Peak grad:   35.8 mmHg MV Area VTI:   1.04 cm     TR Vmax:        299.00 cm/s MV Peak grad:  8.4 mmHg MV Mean grad:  5.0 mmHg     SHUNTS MV Vmax:       1.45 m/s     Systemic VTI:  0.07 m MV Vmean:      102.0 cm/s   Systemic Diam: 2.00 cm MV Decel Time: 96 msec MV E velocity: 82.70 cm/s MV A velocity: 121.00 cm/s MV E/A ratio:  0.68 Ida Rogue MD Electronically signed by Ida Rogue MD Signature Date/Time: 06/14/2022/1:41:02 PM    Final    CT HEAD WO CONTRAST (5MM)  Result Date: 06/12/2022 CLINICAL DATA:  Altered mental status EXAM: CT HEAD WITHOUT CONTRAST TECHNIQUE: Contiguous axial images were obtained from the base of the skull through the vertex without intravenous contrast. RADIATION DOSE REDUCTION: This exam was performed according to the departmental dose-optimization program which  includes automated exposure control, adjustment of the mA and/or kV according to patient size and/or use of iterative reconstruction technique. COMPARISON:  04/20/2022 FINDINGS: Brain: No evidence of acute infarction, hemorrhage, hydrocephalus, extra-axial collection or mass lesion/mass effect. Periventricular and deep white matter hypodensity. Calcification of the bilateral globi pallidi as well as striated vascular calcification of the bilateral corona radiata unchanged (series 3, image 13, 21). Vascular: No hyperdense vessel or unexpected calcification. Skull: Normal. Negative for fracture or focal lesion. Sinuses/Orbits: No acute finding. Other: Extensive vascular calcinosis about the included head and neck. IMPRESSION: No acute intracranial pathology. Small-vessel white matter disease. Electronically Signed   By: Delanna Ahmadi M.D.   On: 06/12/2022 16:06   DG Chest Port 1 View  Result Date: 06/12/2022 CLINICAL DATA:  Sepsis, CHF, COPD EXAM: PORTABLE CHEST 1 VIEW COMPARISON:  Portable exam 0915 hours compared to 09/25/2021; correlation CT angio head/neck 10/29/2016 FINDINGS: Dual lumen central venous catheter via infra diaphragmatic approach with tip projecting over the SVC/RIGHT atrial junction. Enlargement of cardiac silhouette post CABG and MVR. Persistent LEFT pleural effusion and compressive atelectasis of LEFT lung. Underlying pulmonary infiltrates bilaterally. Calcified mass LEFT paratracheal, corresponding to calcified LEFT thyroid mass on prior CT from 2018. No pneumothorax or acute osseous findings. Atherosclerotic calcifications aorta with stent identified at RIGHT axillary vessels. IMPRESSION: Persistent LEFT pleural effusion and compressive atelectasis of LEFT lung. Mild diffuse pulmonary infiltrates question pulmonary edema. Mild enlargement of cardiac silhouette post CABG and MVR. Calcified LEFT thyroid mass unchanged since 10/28/2016; Stability for greater than 5 years implies benignity; no  biopsy or followup indicated (ref: J Am Coll Radiol. 2015 Feb;12(2): 143-50). Electronically Signed   By: Lavonia Dana M.D.   On: 06/12/2022 09:29    Microbiology: Results for orders placed or performed during the hospital encounter of 06/02/22  Blood culture (routine x 2)     Status: None   Collection Time: 06/02/22  4:24 PM   Specimen: BLOOD  Result Value Ref Range Status   Specimen Description BLOOD LEFT ANTECUBITAL  Final   Special Requests   Final    BOTTLES DRAWN AEROBIC AND ANAEROBIC Blood Culture results may not be optimal due to an inadequate volume of blood received in culture bottles   Culture   Final    NO GROWTH 5 DAYS Performed at [facility], [address] [place], [place] [address]    Report Status 06/07/2022 FINAL  Final  Blood culture (routine x 2)     Status: None   Collection Time: 06/02/22  8:45 PM   Specimen: BLOOD LEFT HAND  Result Value Ref Range Status   Specimen Description BLOOD LEFT HAND  Final   Special Requests   Final    BOTTLES DRAWN AEROBIC AND ANAEROBIC Blood Culture adequate volume   Culture   Final    NO GROWTH 5 DAYS Performed at [facility], [address] [place], [place] [address]    Report Status 06/07/2022 FINAL  Final  Culture, blood (Routine X 2) w Reflex to ID Panel     Status: None   Collection Time: 06/12/22 11:20 AM   Specimen: BLOOD  Result Value Ref Range Status   Specimen Description BLOOD BLOOD RIGHT HAND  Final   Special Requests   Final    BOTTLES DRAWN AEROBIC AND ANAEROBIC Blood Culture adequate volume   Culture   Final    NO GROWTH 5 DAYS Performed at [facility], [address] [place], [place] [address]    Report Status 06/17/2022 FINAL  Final  MRSA Next Gen by PCR, Nasal     Status: None   Collection Time: 06/12/22 12:34 PM   Specimen: Nasal Mucosa; Nasal Swab  Result Value Ref Range Status   MRSA by PCR Next Gen NOT DETECTED NOT DETECTED Final    Comment: (NOTE) The GeneXpert  MRSA Assay (FDA approved for NASAL specimens only), is one component of a comprehensive MRSA colonization surveillance program. It is not intended to diagnose MRSA infection nor to guide or monitor treatment for MRSA infections. Test performance is not FDA approved in patients less than 80 years old. Performed at [facility], [address] [place], [place] [address]   Culture, blood (Routine X 2) w Reflex to ID Panel     Status: None   Collection Time: 06/12/22 12:36 PM   Specimen: BLOOD RIGHT HAND  Result Value Ref Range Status   Specimen Description BLOOD RIGHT HAND  Final   Special Requests   Final    BOTTLES DRAWN AEROBIC AND ANAEROBIC Blood Culture adequate volume   Culture   Final    NO GROWTH 5 DAYS Performed at [facility], [address] [place], [place] [address]    Report Status 06/17/2022 FINAL  Final    Labs: CBC: No results for input(s): "WBC", "NEUTROABS", "HGB", "HCT", "MCV", "PLT" in the last 168 hours.  Basic Metabolic Panel: No results for input(s): "NA", "K", "CL", "CO2", "GLUCOSE", "BUN", "CREATININE", "CALCIUM", "MG", "PHOS" in the last 168 hours.  Liver Function Tests: No results for input(s): "AST", "ALT", "ALKPHOS", "BILITOT", "PROT", "ALBUMIN" in the last 168 hours.  CBG: No results for input(s): "GLUCAP" in the last 168 hours.   Discharge time spent: less than 30 minutes.  Signed: Ezekiel Slocumb, DO [facility] 07/04/2022

## 2022-06-21 NOTE — Progress Notes (Signed)
PHARMACIST - PHYSICIAN COMMUNICATION  CONCERNING: IV to Oral Route Change Policy  RECOMMENDATION: This patient is receiving levetiracetam, pantoprazole by the intravenous route.  Based on criteria approved by the Pharmacy and Therapeutics Committee, the intravenous medication(s) is/are being converted to the equivalent oral dose form(s).  DESCRIPTION: These criteria include: The patient is eating (either orally or via tube) and/or has been taking other orally administered medications for a least 24 hours The patient has no evidence of active gastrointestinal bleeding or impaired GI absorption (gastrectomy, short bowel, patient on TNA or NPO).  If you have questions about this conversion, please contact the [place], [facility] 06/21/2022 7:26 AM

## 2022-06-21 NOTE — Assessment & Plan Note (Signed)
On Keppra.

## 2022-06-21 NOTE — Care Management Important Message (Signed)
Important Message  Patient Details  Name: Ernest [name] MRN: [id number] Date of Birth: August 03, 1946   Medicare Important Message Given:  Other (see comment)  Patient is on comfort care and will discharge to the Hospice Home once a bed is available. Out of respect for the patient and family no Important Message from [facility] given.   Ernest [name] 06/21/2022, 9:11 AM

## 2022-06-21 NOTE — TOC Progression Note (Signed)
Transition of Care [facility]) - Progression Note    Patient Details  Name: Ernest [name] MRN: [id number] Date of Birth: 03-Feb-1946  [facility]) CM/SW Contact  Laurena Slimmer, RN Phone Number: 06/21/2022, 1:08 PM  Clinical Narrative:    Patient will discharge to residential hospice home if bed become available at facility. Face sheet and medical necessity forms printed to floor to add to EMS packet for EMS transport to facility.    Expected Discharge Plan: [place] (with hospice) Barriers to Discharge: Continued Medical Work up  Expected Discharge Plan and Services Expected Discharge Plan: [place] (with hospice)     Post Acute Care Choice: [place] arrangements for the past 2 months: [place] Expected Discharge Date: 06/21/22                                     Social Determinants of Health (SDOH) Interventions    Readmission Risk Interventions    06/10/2022   12:03 PM 06/28/2021   10:38 AM  Readmission Risk Prevention Plan  Transportation Screening Complete Complete  Medication Review Press photographer) Complete Complete  PCP or Specialist appointment within 3-5 days of discharge Complete Complete  HRI or Home Care Consult Complete Complete  SW Recovery Care/Counseling Consult Complete Complete  Palliative Care Screening Not Applicable Not Applicable  Skilled Nursing Facility Complete Complete

## 2022-06-22 NOTE — Progress Notes (Signed)
Transport arrived to bring pt to hospice. Pt very lethargic, not very responsive. 2 personal belongings bag sent with transporters. Discharge packet given to transport. Daytime RN, Amandeep had previously called report to receiving facility. TLC and HD catheters in BL groins remain in place.

## 2022-06-22 NOTE — Progress Notes (Addendum)
Manufacturing engineer [facility])  Hospice Home had a bed available today for Mr. Lantzy.  Spoke with daughter [name], she is not ready to commit to moving him. She has specific questions about his oral intake, stability of V/S and if he could possibly tolerate dialysis again. She advised that she would not make a decision until these questions were answered.  Advised I would relay message to hospital team, and that [facility] would re-engage after [place] were clarified.  Please reach out when the family has made a decision.  Thank you, Venia Carbon DNP, RN [facility] Liaison   **addendum 3 pm, family met with attending, questions answered and they would like to proceed with moving Mr. Gang to [facility].  Necessary consents are in the process of being completed and transport will be arranged for ~ 8 pm.

## 2022-06-22 NOTE — Progress Notes (Signed)
Brief Rounding Note ---   Patient was accepted to hospice facility yesterday, but consent was not able to be obtained from patient's daughter.  I met with patient's three siblings at bedside this afternoon and patient's daughter on speaker phone in the room.  Everyone is in agreement with the plan as it stands - patient on comfort care and will discharge to hospice.  Daughter indicated she would contact hospice to give her consent.   Discharge summary is completed and updated to reflect discharge today.  Patient remains stable for transfer to hospice.

## 2022-06-27 LAB — BLOOD GAS, ARTERIAL
Acid-base deficit: 1.1 mmol/L (ref 0.0–2.0)
Bicarbonate: 23.5 mmol/L (ref 20.0–28.0)
O2 Saturation: 100 %
Patient temperature: 37
pCO2 arterial: 38 mmHg (ref 32–48)
pH, Arterial: 7.4 (ref 7.35–7.45)
pO2, Arterial: 146 mmHg — ABNORMAL HIGH (ref 83–108)

## 2022-07-04 NOTE — Assessment & Plan Note (Signed)
Hemoglobin A1c  7.0 consistent with diagnosis of diabetes.  Mgmt as outlined.

## 2022-07-15 DEATH — deceased

## 2022-07-31 ENCOUNTER — Ambulatory Visit (INDEPENDENT_AMBULATORY_CARE_PROVIDER_SITE_OTHER): Payer: Medicare (Managed Care) | Admitting: Vascular Surgery

## 2022-07-31 ENCOUNTER — Encounter (INDEPENDENT_AMBULATORY_CARE_PROVIDER_SITE_OTHER): Payer: Medicare (Managed Care)

## 2022-07-31 ENCOUNTER — Encounter (INDEPENDENT_AMBULATORY_CARE_PROVIDER_SITE_OTHER): Payer: Self-pay

## 2022-08-13 ENCOUNTER — Encounter (INDEPENDENT_AMBULATORY_CARE_PROVIDER_SITE_OTHER): Payer: Self-pay

## 2022-08-15 ENCOUNTER — Ambulatory Visit: Payer: Medicare (Managed Care) | Attending: Internal Medicine | Admitting: Internal Medicine

## 2022-08-15 NOTE — Progress Notes (Deleted)
Follow-up Outpatient Visit Date: 08/15/2022  Primary Care Provider: Brayton Mars, [place] [id number]  Chief Complaint: ***  HPI:  Mr. Garriga is a 76 y.o. male with history of coronary artery disease status post CABG (LIMA-LAD, sequential SVG-ramus-OM, and SVG-acute marginal) and mitral valve repair Oletta Lamas Physio II 32 mm annuloplasty ring) in 08/2017, chronic HFrEF with recovered LVEF, persistent atrial flutter, NSVT, Frayda Egley-stage renal disease, stroke complicated by seizure, PAD complicated by gangrene and sepsis leading to right AKA (06/2021), hypertension, hyperlipidemia, type 2 diabetes mellitus, and COPD, who presents for follow-up of coronary artery disease, heart failure, and atrial flutter.  He was last seen in our office in late June by Ambrose Pancoast, NP, at which time he was feeling relatively well.  He had been transitioned from warfarin to apixaban due to the difficulty with routine monitoring of his INR.  He was hospitalized at [facility] in mid August due to gangrene of the left foot.  He and his initially refused dialysis and medications but subsequently agreed to therapy.  He underwent a left AKA, requiring postoperative vasopressors in the ICU.  He was discharged to residential hospice with no further plans for hemodialysis.  --------------------------------------------------------------------------------------------------  Past Medical History:  Diagnosis Date   Abnormal stress test    s/p cath November 2013 with modest disease involving the ostial left main, proximal LAD, proximal RCA - do not appear to be hemodynamically signficant; mild LV dysfunction   Anemia    low iron   Anxiety    Arthritis    Bacteremia    Chronic systolic CHF (congestive heart failure) (HCC)    COPD (chronic obstructive pulmonary disease) ([place])    Coronary artery disease    a. per cath report 2013, b. 09/13/2017-CABG X4 & Mitral valve repair   Depression    DVT (deep venous  thrombosis) (HCC)    arm - right- finished blood thinner- not sure when it   Ejection fraction < 50%    Erectile dysfunction    penile implant   ESRD (Luverne Zerkle stage renal disease) ([place])    [facility] on a MWF schedule.      Gout    Hx: of   Hepatitis C    History of seizures    last one 2018   Hyperlipidemia    Hypertension    Mitral valve disease    s/p Mitral repair 09/13/17   Obesity    Retinopathy    Shortness of breath    Hx: of with exertion   Stroke [facility])    "several" left side weakness, speech affected   Type II or unspecified type diabetes mellitus without mention of complication, not stated as uncontrolled    adult onset   Past Surgical History:  Procedure Laterality Date   A/V FISTULAGRAM N/A 10/04/2017   Procedure: A/V FISTULAGRAM;  Surgeon: Elam Dutch, MD;  Location: [place] CV LAB;  Service: Cardiovascular;  Laterality: N/A;   A/V SHUNTOGRAM Left 07/12/2020   Procedure: A/V SHUNTOGRAM;  Surgeon: Serafina Mitchell, MD;  Location: [place] [facility];  Service: Cardiovascular;  Laterality: Left;   AMPUTATION Right 07/08/2021   Procedure: AMPUTATION ABOVE KNEE;  Surgeon: Elmore Guise, MD;  Location: [facility] ORS;  Service: Vascular;  Laterality: Right;   AMPUTATION Left 06/08/2022   Procedure: AMPUTATION ABOVE KNEE;  Surgeon: Algernon Huxley, MD;  Location: [facility] ORS;  Service: Vascular;  Laterality: Left;   ANGIOPLASTY Left 09/24/2019   Procedure: Angioplasty Innominate Vien;  Surgeon: Serafina Mitchell, MD;  Location: [place];  Service: Vascular;  Laterality: Left;   AV FISTULA PLACEMENT Left 01/13/2013   Procedure: INSERTION OF ARTERIOVENOUS (AV) GORE-TEX GRAFT ARM;  Surgeon: Angelia Mould, MD;  Location: [place];  Service: Vascular;  Laterality: Left;   AV FISTULA PLACEMENT Left 05/05/2013   Procedure: INSERTION OF LEFT UPPER ARM  ARTERIOVENOUS GORTEX GRAFT;  Surgeon: Angelia Mould, MD;  Location: [place];  Service: Vascular;  Laterality: Left;   AV FISTULA  PLACEMENT Left 11/26/2019   Procedure: INSERTION OF ARTERIOVENOUS (AV) GORE-TEX GRAFT, left  ARM;  Surgeon: Serafina Mitchell, MD;  Location: [place];  Service: Vascular;  Laterality: Left;   [place] REMOVAL Left 03/26/2013   Procedure: REMOVAL OF  NON INCORPORATED ARTERIOVENOUS GORETEX GRAFT ([place]) left arm * repair of  left brachial artery with vein patch angioplasty.;  Surgeon: Angelia Mould, MD;  Location: [place];  Service: Vascular;  Laterality: Left;   CARDIAC CATHETERIZATION  August 26, 2012   CATARACT EXTRACTION W/ INTRAOCULAR LENS  IMPLANT, BILATERAL     COLONOSCOPY     CORONARY ARTERY BYPASS GRAFT N/A 09/13/2017   Procedure: CORONARY ARTERY BYPASS GRAFTING (CABG) times four LIMA  to LAD, SVG sequentially to OM and RAMUS Intermediate and SVG to Acute Marginal;  Surgeon: Melrose Nakayama, MD;  Location: [place];  Service: Open Heart Surgery;  Laterality: N/A;   DIALYSIS/PERMA CATHETER INSERTION N/A 02/28/2021   Procedure: DIALYSIS/PERMA CATHETER EXCHANGE;  Surgeon: Katha Cabal, MD;  Location: [place] CV LAB;  Service: Cardiovascular;  Laterality: N/A;   EMBOLECTOMY Right 08/27/2013   Procedure: EMBOLECTOMY;  Surgeon: Mal Misty, MD;  Location: [place];  Service: Vascular;  Laterality: Right;  Thrombectomy of Radial and ulnar artery.   ENDOVASCULAR STENT INSERTION Left 09/24/2019   Procedure: Endovascular Stent Graft Insertion, Arm Graft;  Surgeon: Serafina Mitchell, MD;  Location: [facility] OR;  Service: Vascular;  Laterality: Left;   EYE SURGERY     laser.  and surgery for DM   fistula     RUE and wrist   FISTULOGRAM Left 09/24/2019   Procedure: SHUNTOGRAM OF ARTERIOVENOUS FISTULA LEFT ARM,;  Surgeon: Serafina Mitchell, MD;  Location: [place];  Service: Vascular;  Laterality: Left;   INSERTION OF DIALYSIS CATHETER Right 01/01/2013   Procedure: INSERTION OF DIALYSIS CATHETER right  internal jugular;  Surgeon: Serafina Mitchell, MD;  Location: [place];  Service: Vascular;  Laterality:  Right;   INSERTION OF DIALYSIS CATHETER N/A 03/26/2013   Procedure: INSERTION OF DIALYSIS CATHETER Left internal jugular vein;  Surgeon: Angelia Mould, MD;  Location: [place];  Service: Vascular;  Laterality: N/A;   INSERTION OF DIALYSIS CATHETER Right 11/26/2019   Procedure: INSERTION OF DIALYSIS CATHETER, right internal jugular;  Surgeon: Serafina Mitchell, MD;  Location: [place];  Service: Vascular;  Laterality: Right;   IR THORACENTESIS ASP PLEURAL SPACE W/IMG GUIDE  09/30/2019   LEFT HEART CATH AND CORONARY ANGIOGRAPHY N/A 09/09/2017   Procedure: LEFT HEART CATH AND CORONARY ANGIOGRAPHY;  Surgeon: Troy Sine, MD;  Location: [place] [facility];  Service: Cardiovascular;  Laterality: N/A;   LIGATION OF ARTERIOVENOUS  FISTULA Right 12/30/2013   Procedure: REMOVAL OF SEGMENT OF GORTEX GRAFT AND FISTULA  AND REPAIR OF BRACHIAL ARTERY;  Surgeon: Mal Misty, MD;  Location: [place];  Service: Vascular;  Laterality: Right;   LOWER EXTREMITY ANGIOGRAPHY N/A 06/28/2021   Procedure: Lower Extremity Angiography;  Surgeon: Katha Cabal, MD;  Location: [place] CV LAB;  Service: Cardiovascular;  Laterality: N/A;   LOWER EXTREMITY ANGIOGRAPHY Left 11/23/2021   Procedure: LOWER EXTREMITY ANGIOGRAPHY;  Surgeon: Algernon Huxley, MD;  Location: [place] [facility];  Service: Cardiovascular;  Laterality: Left;   MITRAL VALVE REPAIR N/A 09/13/2017   Procedure: MITRAL VALVE REPAIR (MVR);  Surgeon: Melrose Nakayama, MD;  Location: [place];  Service: Open Heart Surgery;  Laterality: N/A;   MITRAL VALVE REPAIR (MV)/CORONARY ARTERY BYPASS GRAFTING (CABG)  09/13/2017   PENILE PROSTHESIS IMPLANT     1997.. no card   PERIPHERAL VASCULAR BALLOON ANGIOPLASTY Left 10/04/2017   Procedure: PERIPHERAL VASCULAR BALLOON ANGIOPLASTY;  Surgeon: Elam Dutch, MD;  Location: [place] [facility];  Service: Cardiovascular;  Laterality: Left;  AVF   PERIPHERAL VASCULAR BALLOON ANGIOPLASTY Left 07/12/2020    Procedure: PERIPHERAL VASCULAR BALLOON ANGIOPLASTY;  Surgeon: Serafina Mitchell, MD;  Location: [place] [facility];  Service: Cardiovascular;  Laterality: Left;  ARM SHUNTOGRAM   REVISION OF ARTERIOVENOUS GORETEX GRAFT Left 09/24/2019   Procedure: REVISION OF ARTERIOVENOUS GORETEX GRAFT LEFT ARM;  Surgeon: Serafina Mitchell, MD;  Location: [facility] OR;  Service: Vascular;  Laterality: Left;   TEE WITHOUT CARDIOVERSION N/A 06/11/2013   Procedure: TRANSESOPHAGEAL ECHOCARDIOGRAM (TEE);  Surgeon: Larey Dresser, MD;  Location: [facility] ENDOSCOPY;  Service: Cardiovascular;  Laterality: N/A;   TEE WITHOUT CARDIOVERSION N/A 09/13/2017   Procedure: TRANSESOPHAGEAL ECHOCARDIOGRAM (TEE);  Surgeon: Melrose Nakayama, MD;  Location: [place];  Service: Open Heart Surgery;  Laterality: N/A;   THROMBECTOMY W/ EMBOLECTOMY Left 03/26/2013   Procedure: Attempted thrombectomy of left arm arteriovenous goretex graft.;  Surgeon: Angelia Mould, MD;  Location: [facility] OR;  Service: Vascular;  Laterality: Left;   ULTRASOUND GUIDANCE FOR VASCULAR ACCESS Bilateral 09/24/2019   Procedure: Ultrasound Guidance For Vascular Access, Right Femoral Vein and Left Arm Graft;  Surgeon: Serafina Mitchell, MD;  Location: [facility] OR;  Service: Vascular;  Laterality: Bilateral;   VENOGRAM N/A 04/07/2013   Procedure: VENOGRAM;  Surgeon: Serafina Mitchell, MD;  Location: [facility] CATH LAB;  Service: Cardiovascular;  Laterality: N/A;    No outpatient medications have been marked as taking for the 08/15/22 encounter (Appointment) with Reya Aurich, Harrell Gave, MD.    Allergies: Patient has no known allergies.  Social History   Tobacco Use   Smoking status: Former    Years: 7.00    Types: Cigarettes    Quit date: 06/10/1973    Years since quitting: 49.2   Smokeless tobacco: Never   Tobacco comments:    Quit in 1974.  Vaping Use   Vaping Use: Never used  Substance Use Topics   Alcohol use: No    Alcohol/week: 0.0 standard drinks of alcohol   Drug use: No     Family History  Problem Relation Age of Onset   Heart disease Father    Diabetes Sister    Diabetes Brother    Diabetes Son     Review of Systems: A 12-system review of systems was performed and was negative except as noted in the HPI.  --------------------------------------------------------------------------------------------------  Physical Exam: There were no vitals taken for this visit.  General:  NAD. Neck: No JVD or HJR. Lungs: Clear to auscultation bilaterally without wheezes or crackles. Heart: Regular rate and rhythm without murmurs, rubs, or gallops. Abdomen: Soft, nontender, nondistended. Extremities: No lower extremity edema.  EKG:  ***  Lab Results  Component Value Date  WBC 14.5 (H) 06/19/2022   HGB 10.9 (L) 06/19/2022   HCT 34.7 (L) 06/19/2022   MCV 85.5 06/19/2022   PLT 431 (H) 06/19/2022    Lab Results  Component Value Date   NA 140 06/19/2022   K 3.8 06/19/2022   CL 103 06/19/2022   CO2 24 06/19/2022   BUN 33 (H) 06/19/2022   CREATININE 3.99 (H) 06/19/2022   GLUCOSE 69 (L) 06/19/2022   ALT 10 06/17/2022    Lab Results  Component Value Date   CHOL 122 09/26/2021   HDL 45 09/26/2021   LDLCALC 67 09/26/2021   TRIG 48 09/26/2021   CHOLHDL 2.7 09/26/2021    --------------------------------------------------------------------------------------------------  ASSESSMENT AND PLAN: Harrell Gave Cathi Hazan, MD 08/15/2022 8:03 AM

## 2022-08-16 ENCOUNTER — Encounter: Payer: Self-pay | Admitting: Internal Medicine

## 2022-10-30 ENCOUNTER — Ambulatory Visit: Payer: Medicare (Managed Care) | Admitting: Neurology
